# Patient Record
Sex: Male | Born: 1935
Health system: Southern US, Community
[De-identification: ages and names within clinical notes are randomized; demographics above are authoritative.]

## PROBLEM LIST (undated history)

## (undated) DIAGNOSIS — G4733 Obstructive sleep apnea (adult) (pediatric): Secondary | ICD-10-CM

## (undated) DIAGNOSIS — Z9289 Personal history of other medical treatment: Secondary | ICD-10-CM

## (undated) DIAGNOSIS — Z9989 Dependence on other enabling machines and devices: Secondary | ICD-10-CM

## (undated) DIAGNOSIS — I10 Essential (primary) hypertension: Secondary | ICD-10-CM

## (undated) DIAGNOSIS — K5731 Diverticulosis of large intestine without perforation or abscess with bleeding: Secondary | ICD-10-CM

## (undated) DIAGNOSIS — H353230 Exudative age-related macular degeneration, bilateral, stage unspecified: Secondary | ICD-10-CM

## (undated) DIAGNOSIS — I214 Non-ST elevation (NSTEMI) myocardial infarction: Secondary | ICD-10-CM

## (undated) DIAGNOSIS — Z860100 Personal history of colon polyps, unspecified: Secondary | ICD-10-CM

## (undated) DIAGNOSIS — N529 Male erectile dysfunction, unspecified: Secondary | ICD-10-CM

## (undated) DIAGNOSIS — H9190 Unspecified hearing loss, unspecified ear: Secondary | ICD-10-CM

## (undated) DIAGNOSIS — N183 Chronic kidney disease, stage 3 unspecified: Secondary | ICD-10-CM

## (undated) DIAGNOSIS — C4490 Unspecified malignant neoplasm of skin, unspecified: Secondary | ICD-10-CM

## (undated) DIAGNOSIS — I509 Heart failure, unspecified: Secondary | ICD-10-CM

## (undated) DIAGNOSIS — I4892 Unspecified atrial flutter: Secondary | ICD-10-CM

## (undated) DIAGNOSIS — I340 Nonrheumatic mitral (valve) insufficiency: Secondary | ICD-10-CM

## (undated) DIAGNOSIS — E119 Type 2 diabetes mellitus without complications: Secondary | ICD-10-CM

## (undated) DIAGNOSIS — I779 Disorder of arteries and arterioles, unspecified: Secondary | ICD-10-CM

## (undated) DIAGNOSIS — I209 Angina pectoris, unspecified: Secondary | ICD-10-CM

## (undated) DIAGNOSIS — I4891 Unspecified atrial fibrillation: Secondary | ICD-10-CM

## (undated) DIAGNOSIS — M25519 Pain in unspecified shoulder: Secondary | ICD-10-CM

## (undated) DIAGNOSIS — I739 Peripheral vascular disease, unspecified: Secondary | ICD-10-CM

## (undated) DIAGNOSIS — E785 Hyperlipidemia, unspecified: Secondary | ICD-10-CM

## (undated) DIAGNOSIS — I2510 Atherosclerotic heart disease of native coronary artery without angina pectoris: Secondary | ICD-10-CM

## (undated) DIAGNOSIS — I4510 Unspecified right bundle-branch block: Secondary | ICD-10-CM

## (undated) DIAGNOSIS — R9430 Abnormal result of cardiovascular function study, unspecified: Secondary | ICD-10-CM

## (undated) DIAGNOSIS — D649 Anemia, unspecified: Secondary | ICD-10-CM

## (undated) DIAGNOSIS — J189 Pneumonia, unspecified organism: Secondary | ICD-10-CM

## (undated) DIAGNOSIS — M1990 Unspecified osteoarthritis, unspecified site: Secondary | ICD-10-CM

## (undated) DIAGNOSIS — E1142 Type 2 diabetes mellitus with diabetic polyneuropathy: Secondary | ICD-10-CM

## (undated) HISTORY — DX: Pneumonia, unspecified organism: J18.9

## (undated) HISTORY — PX: SKIN CANCER EXCISION: SHX779

## (undated) HISTORY — DX: Male erectile dysfunction, unspecified: N52.9

## (undated) HISTORY — PX: COLONOSCOPY W/ POLYPECTOMY: SHX1380

## (undated) HISTORY — DX: Personal history of colon polyps, unspecified: Z86.0100

## (undated) HISTORY — DX: Essential (primary) hypertension: I10

## (undated) HISTORY — DX: Disorder of arteries and arterioles, unspecified: I77.9

## (undated) HISTORY — DX: Unspecified atrial flutter: I48.92

## (undated) HISTORY — PX: CARDIOVERSION: SHX1299

## (undated) HISTORY — DX: Atherosclerotic heart disease of native coronary artery without angina pectoris: I25.10

## (undated) HISTORY — DX: Diverticulosis of large intestine without perforation or abscess with bleeding: K57.31

## (undated) HISTORY — DX: Hyperlipidemia, unspecified: E78.5

## (undated) HISTORY — PX: CATARACT EXTRACTION W/ INTRAOCULAR LENS  IMPLANT, BILATERAL: SHX1307

## (undated) HISTORY — DX: Pain in unspecified shoulder: M25.519

## (undated) HISTORY — DX: Unspecified atrial fibrillation: I48.91

## (undated) HISTORY — DX: Peripheral vascular disease, unspecified: I73.9

## (undated) HISTORY — PX: TONSILLECTOMY: SUR1361

## (undated) HISTORY — DX: Nonrheumatic mitral (valve) insufficiency: I34.0

## (undated) HISTORY — DX: Type 2 diabetes mellitus with diabetic polyneuropathy: E11.42

## (undated) HISTORY — PX: CORONARY ARTERY BYPASS GRAFT: SHX141

## (undated) HISTORY — DX: Unspecified malignant neoplasm of skin, unspecified: C44.90

## (undated) HISTORY — DX: Personal history of colonic polyps: Z86.010

## (undated) HISTORY — PX: VASECTOMY: SHX75

## (undated) HISTORY — DX: Anemia, unspecified: D64.9

## (undated) HISTORY — PX: CHOLECYSTECTOMY: SHX55

## (undated) HISTORY — DX: Unspecified right bundle-branch block: I45.10

## (undated) HISTORY — DX: Abnormal result of cardiovascular function study, unspecified: R94.30

---

## 1992-11-14 HISTORY — PX: CAROTID ENDARTERECTOMY: SUR193

## 1999-01-26 DIAGNOSIS — D126 Benign neoplasm of colon, unspecified: Secondary | ICD-10-CM | POA: Insufficient documentation

## 2001-10-24 ENCOUNTER — Encounter: Payer: Self-pay | Admitting: Family Medicine

## 2001-10-25 ENCOUNTER — Ambulatory Visit (HOSPITAL_BASED_OUTPATIENT_CLINIC_OR_DEPARTMENT_OTHER): Admission: RE | Admit: 2001-10-25 | Discharge: 2001-10-25 | Payer: Self-pay | Admitting: Pulmonary Disease

## 2002-02-19 ENCOUNTER — Ambulatory Visit (HOSPITAL_BASED_OUTPATIENT_CLINIC_OR_DEPARTMENT_OTHER): Admission: RE | Admit: 2002-02-19 | Discharge: 2002-02-19 | Payer: Self-pay | Admitting: Pulmonary Disease

## 2002-09-14 ENCOUNTER — Encounter: Payer: Self-pay | Admitting: Family Medicine

## 2002-09-14 LAB — CONVERTED CEMR LAB: Hgb A1c MFr Bld: 7.2 %

## 2003-04-15 ENCOUNTER — Encounter: Payer: Self-pay | Admitting: Family Medicine

## 2003-04-15 LAB — CONVERTED CEMR LAB: Hgb A1c MFr Bld: 6 %

## 2003-11-15 ENCOUNTER — Encounter: Payer: Self-pay | Admitting: Family Medicine

## 2003-11-15 LAB — CONVERTED CEMR LAB: Hgb A1c MFr Bld: 5.8 %

## 2004-04-14 ENCOUNTER — Encounter: Payer: Self-pay | Admitting: Family Medicine

## 2004-04-14 LAB — CONVERTED CEMR LAB: Hgb A1c MFr Bld: 6.5 %

## 2004-10-05 ENCOUNTER — Ambulatory Visit: Payer: Self-pay | Admitting: Family Medicine

## 2004-10-20 ENCOUNTER — Ambulatory Visit: Payer: Self-pay | Admitting: Family Medicine

## 2004-11-14 ENCOUNTER — Encounter: Payer: Self-pay | Admitting: Family Medicine

## 2004-11-14 HISTORY — PX: CARDIAC CATHETERIZATION: SHX172

## 2004-11-14 LAB — CONVERTED CEMR LAB
Hgb A1c MFr Bld: 6.6 %
Microalbumin U total vol: 3.5 mg/L

## 2004-11-19 ENCOUNTER — Ambulatory Visit: Payer: Self-pay | Admitting: Internal Medicine

## 2004-11-24 ENCOUNTER — Ambulatory Visit: Payer: Self-pay | Admitting: Family Medicine

## 2004-12-08 ENCOUNTER — Ambulatory Visit: Payer: Self-pay | Admitting: Gastroenterology

## 2004-12-23 ENCOUNTER — Ambulatory Visit: Payer: Self-pay | Admitting: Gastroenterology

## 2004-12-23 ENCOUNTER — Ambulatory Visit (HOSPITAL_COMMUNITY): Admission: RE | Admit: 2004-12-23 | Discharge: 2004-12-23 | Payer: Self-pay | Admitting: Gastroenterology

## 2005-02-04 ENCOUNTER — Ambulatory Visit: Payer: Self-pay | Admitting: Cardiology

## 2005-02-12 ENCOUNTER — Encounter: Payer: Self-pay | Admitting: Family Medicine

## 2005-02-12 LAB — CONVERTED CEMR LAB: Hgb A1c MFr Bld: 6.2 %

## 2005-02-22 ENCOUNTER — Ambulatory Visit: Payer: Self-pay | Admitting: Family Medicine

## 2005-03-01 ENCOUNTER — Ambulatory Visit: Payer: Self-pay | Admitting: Family Medicine

## 2005-05-24 ENCOUNTER — Ambulatory Visit: Payer: Self-pay | Admitting: Family Medicine

## 2005-06-28 ENCOUNTER — Ambulatory Visit: Payer: Self-pay | Admitting: Family Medicine

## 2005-07-11 ENCOUNTER — Ambulatory Visit: Payer: Self-pay | Admitting: Cardiology

## 2005-08-18 ENCOUNTER — Ambulatory Visit: Payer: Self-pay | Admitting: Family Medicine

## 2005-08-30 ENCOUNTER — Ambulatory Visit: Payer: Self-pay | Admitting: Family Medicine

## 2005-08-31 ENCOUNTER — Inpatient Hospital Stay (HOSPITAL_COMMUNITY): Admission: AD | Admit: 2005-08-31 | Discharge: 2005-09-10 | Payer: Self-pay | Admitting: Cardiology

## 2005-08-31 ENCOUNTER — Ambulatory Visit: Payer: Self-pay | Admitting: Cardiology

## 2005-08-31 ENCOUNTER — Ambulatory Visit: Payer: Self-pay

## 2005-09-13 ENCOUNTER — Ambulatory Visit: Payer: Self-pay | Admitting: Family Medicine

## 2005-09-23 ENCOUNTER — Encounter: Admission: RE | Admit: 2005-09-23 | Discharge: 2005-09-23 | Payer: Self-pay | Admitting: Cardiothoracic Surgery

## 2005-09-26 ENCOUNTER — Ambulatory Visit: Payer: Self-pay | Admitting: Cardiology

## 2005-10-04 ENCOUNTER — Ambulatory Visit: Payer: Self-pay | Admitting: Family Medicine

## 2005-11-14 ENCOUNTER — Encounter: Payer: Self-pay | Admitting: Family Medicine

## 2005-11-14 LAB — CONVERTED CEMR LAB: Hgb A1c MFr Bld: 6 %

## 2005-11-24 ENCOUNTER — Ambulatory Visit: Payer: Self-pay | Admitting: Cardiology

## 2005-11-30 ENCOUNTER — Ambulatory Visit: Payer: Self-pay | Admitting: Family Medicine

## 2006-01-23 ENCOUNTER — Ambulatory Visit: Payer: Self-pay | Admitting: Cardiology

## 2006-01-30 ENCOUNTER — Ambulatory Visit: Payer: Self-pay | Admitting: Cardiology

## 2006-01-31 ENCOUNTER — Ambulatory Visit: Payer: Self-pay | Admitting: Family Medicine

## 2006-02-16 ENCOUNTER — Ambulatory Visit: Payer: Self-pay | Admitting: Cardiology

## 2006-03-30 ENCOUNTER — Ambulatory Visit: Payer: Self-pay | Admitting: Cardiology

## 2006-04-13 ENCOUNTER — Ambulatory Visit: Payer: Self-pay | Admitting: Family Medicine

## 2006-04-14 ENCOUNTER — Encounter: Payer: Self-pay | Admitting: Family Medicine

## 2006-04-14 LAB — CONVERTED CEMR LAB
Hgb A1c MFr Bld: 6.1 %
Microalbumin U total vol: 20.4 mg/L

## 2006-04-20 ENCOUNTER — Ambulatory Visit: Payer: Self-pay | Admitting: Family Medicine

## 2006-05-02 ENCOUNTER — Ambulatory Visit: Payer: Self-pay | Admitting: Cardiology

## 2006-06-01 ENCOUNTER — Ambulatory Visit: Payer: Self-pay | Admitting: Family Medicine

## 2006-06-20 ENCOUNTER — Encounter: Admission: RE | Admit: 2006-06-20 | Discharge: 2006-06-20 | Payer: Self-pay | Admitting: Family Medicine

## 2006-06-20 ENCOUNTER — Ambulatory Visit: Payer: Self-pay | Admitting: Family Medicine

## 2006-07-15 ENCOUNTER — Encounter: Payer: Self-pay | Admitting: Family Medicine

## 2006-07-15 LAB — CONVERTED CEMR LAB: Hgb A1c MFr Bld: 5.8 %

## 2006-07-18 ENCOUNTER — Ambulatory Visit: Payer: Self-pay | Admitting: Family Medicine

## 2006-07-20 ENCOUNTER — Ambulatory Visit: Payer: Self-pay | Admitting: Family Medicine

## 2006-07-31 ENCOUNTER — Ambulatory Visit: Payer: Self-pay | Admitting: Cardiology

## 2006-09-28 ENCOUNTER — Ambulatory Visit: Payer: Self-pay | Admitting: Family Medicine

## 2006-10-02 ENCOUNTER — Ambulatory Visit: Payer: Self-pay | Admitting: Cardiology

## 2006-10-04 ENCOUNTER — Ambulatory Visit: Payer: Self-pay

## 2006-10-27 ENCOUNTER — Ambulatory Visit: Payer: Self-pay | Admitting: Family Medicine

## 2006-11-14 ENCOUNTER — Encounter: Payer: Self-pay | Admitting: Family Medicine

## 2006-11-14 HISTORY — PX: LAPAROSCOPIC CHOLECYSTECTOMY: SUR755

## 2006-11-14 LAB — CONVERTED CEMR LAB
Hgb A1c MFr Bld: 6.5 %
PSA: 0.73 ng/mL

## 2006-11-21 ENCOUNTER — Ambulatory Visit: Payer: Self-pay | Admitting: Family Medicine

## 2006-11-23 ENCOUNTER — Ambulatory Visit: Payer: Self-pay | Admitting: Family Medicine

## 2007-01-29 ENCOUNTER — Ambulatory Visit: Payer: Self-pay | Admitting: Family Medicine

## 2007-01-31 ENCOUNTER — Ambulatory Visit: Payer: Self-pay | Admitting: Cardiovascular Disease

## 2007-02-05 ENCOUNTER — Encounter: Payer: Self-pay | Admitting: Family Medicine

## 2007-02-06 DIAGNOSIS — F528 Other sexual dysfunction not due to a substance or known physiological condition: Secondary | ICD-10-CM

## 2007-02-08 ENCOUNTER — Ambulatory Visit: Payer: Self-pay | Admitting: Family Medicine

## 2007-02-14 ENCOUNTER — Ambulatory Visit: Payer: Self-pay | Admitting: Cardiology

## 2007-03-03 DIAGNOSIS — E118 Type 2 diabetes mellitus with unspecified complications: Secondary | ICD-10-CM

## 2007-03-03 DIAGNOSIS — I10 Essential (primary) hypertension: Secondary | ICD-10-CM | POA: Insufficient documentation

## 2007-03-03 DIAGNOSIS — T7840XA Allergy, unspecified, initial encounter: Secondary | ICD-10-CM | POA: Insufficient documentation

## 2007-03-03 DIAGNOSIS — E119 Type 2 diabetes mellitus without complications: Secondary | ICD-10-CM | POA: Insufficient documentation

## 2007-03-03 DIAGNOSIS — E785 Hyperlipidemia, unspecified: Secondary | ICD-10-CM

## 2007-03-03 DIAGNOSIS — I119 Hypertensive heart disease without heart failure: Secondary | ICD-10-CM

## 2007-03-03 DIAGNOSIS — Z794 Long term (current) use of insulin: Secondary | ICD-10-CM

## 2007-03-13 ENCOUNTER — Telehealth (INDEPENDENT_AMBULATORY_CARE_PROVIDER_SITE_OTHER): Payer: Self-pay | Admitting: *Deleted

## 2007-03-29 ENCOUNTER — Ambulatory Visit (HOSPITAL_COMMUNITY): Admission: RE | Admit: 2007-03-29 | Discharge: 2007-03-29 | Payer: Self-pay | Admitting: General Surgery

## 2007-03-29 ENCOUNTER — Encounter (INDEPENDENT_AMBULATORY_CARE_PROVIDER_SITE_OTHER): Payer: Self-pay | Admitting: Specialist

## 2007-04-26 ENCOUNTER — Encounter: Payer: Self-pay | Admitting: Family Medicine

## 2007-05-17 ENCOUNTER — Ambulatory Visit: Payer: Self-pay | Admitting: Cardiology

## 2007-09-04 ENCOUNTER — Ambulatory Visit: Payer: Self-pay | Admitting: Cardiology

## 2007-09-06 ENCOUNTER — Ambulatory Visit: Payer: Self-pay | Admitting: Family Medicine

## 2007-09-06 ENCOUNTER — Ambulatory Visit: Payer: Self-pay

## 2007-09-06 LAB — CONVERTED CEMR LAB: Hgb A1c MFr Bld: 6.3 % — ABNORMAL HIGH (ref 4.6–6.0)

## 2007-09-12 ENCOUNTER — Ambulatory Visit: Payer: Self-pay | Admitting: Family Medicine

## 2007-09-13 ENCOUNTER — Ambulatory Visit: Payer: Self-pay | Admitting: Family Medicine

## 2007-09-13 LAB — CONVERTED CEMR LAB
Basophils Absolute: 0 10*3/uL (ref 0.0–0.1)
Basophils Relative: 0.6 % (ref 0.0–1.0)
Bilirubin Urine: NEGATIVE
Blood in Urine, dipstick: NEGATIVE
Eosinophils Absolute: 0.8 10*3/uL — ABNORMAL HIGH (ref 0.0–0.6)
Eosinophils Relative: 13.6 % — ABNORMAL HIGH (ref 0.0–5.0)
Glucose, Urine, Semiquant: NEGATIVE
HCT: 33.9 % — ABNORMAL LOW (ref 39.0–52.0)
Hemoglobin: 11.5 g/dL — ABNORMAL LOW (ref 13.0–17.0)
Hgb A1c MFr Bld: 6.3 % — ABNORMAL HIGH (ref 4.6–6.0)
Ketones, urine, test strip: NEGATIVE
Lymphocytes Relative: 24.1 % (ref 12.0–46.0)
MCHC: 33.9 g/dL (ref 30.0–36.0)
MCV: 92.5 fL (ref 78.0–100.0)
Monocytes Absolute: 0.5 10*3/uL (ref 0.2–0.7)
Monocytes Relative: 9.7 % (ref 3.0–11.0)
Neutro Abs: 3 10*3/uL (ref 1.4–7.7)
Neutrophils Relative %: 52 % (ref 43.0–77.0)
Nitrite: NEGATIVE
Platelets: 133 10*3/uL — ABNORMAL LOW (ref 150–400)
Protein, U semiquant: NEGATIVE
RBC: 3.67 M/uL — ABNORMAL LOW (ref 4.22–5.81)
RDW: 13.9 % (ref 11.5–14.6)
Specific Gravity, Urine: 1.01
TSH: 2.97 microintl units/mL (ref 0.35–5.50)
Urobilinogen, UA: 1
WBC Urine, dipstick: NEGATIVE
WBC: 5.6 10*3/uL (ref 4.5–10.5)
pH: 6

## 2007-09-17 LAB — CONVERTED CEMR LAB
Basophils Absolute: 0 10*3/uL (ref 0.0–0.1)
Basophils Relative: 0.6 % (ref 0.0–1.0)
Eosinophils Absolute: 0.8 10*3/uL — ABNORMAL HIGH (ref 0.0–0.6)
Eosinophils Relative: 13.6 % — ABNORMAL HIGH (ref 0.0–5.0)
Folate: >20 ng/mL
HCT: 33.9 % — ABNORMAL LOW (ref 39.0–52.0)
Hemoglobin: 11.5 g/dL — ABNORMAL LOW (ref 13.0–17.0)
Hgb A1c MFr Bld: 6.3 % — ABNORMAL HIGH (ref 4.6–6.0)
Iron: 58 ug/dL (ref 42–165)
Lymphocytes Relative: 24.1 % (ref 12.0–46.0)
MCHC: 33.9 g/dL (ref 30.0–36.0)
MCV: 92.5 fL (ref 78.0–100.0)
Monocytes Absolute: 0.5 10*3/uL (ref 0.2–0.7)
Monocytes Relative: 9.7 % (ref 3.0–11.0)
Neutro Abs: 3 10*3/uL (ref 1.4–7.7)
Neutrophils Relative %: 52 % (ref 43.0–77.0)
Platelets: 133 10*3/uL — ABNORMAL LOW (ref 150–400)
RBC: 3.67 M/uL — ABNORMAL LOW (ref 4.22–5.81)
RDW: 13.9 % (ref 11.5–14.6)
TSH: 2.97 microintl units/mL (ref 0.35–5.50)
Transferrin: 309.6 mg/dL (ref >=212.0)
Vitamin B-12: 263 pg/mL (ref 211–911)
WBC: 5.6 10*3/uL (ref 4.5–10.5)

## 2007-10-02 ENCOUNTER — Ambulatory Visit: Payer: Self-pay | Admitting: Family Medicine

## 2007-10-02 ENCOUNTER — Encounter (INDEPENDENT_AMBULATORY_CARE_PROVIDER_SITE_OTHER): Payer: Self-pay | Admitting: *Deleted

## 2007-10-02 LAB — FECAL OCCULT BLOOD, GUAIAC: Fecal Occult Blood: NEGATIVE

## 2007-10-04 ENCOUNTER — Ambulatory Visit: Payer: Self-pay | Admitting: Family Medicine

## 2007-10-24 ENCOUNTER — Ambulatory Visit: Payer: Self-pay | Admitting: Family Medicine

## 2007-10-24 LAB — CONVERTED CEMR LAB
Basophils Absolute: 0 10*3/uL (ref 0.0–0.1)
Basophils Relative: 0.7 % (ref 0.0–1.0)
Eosinophils Absolute: 0.7 10*3/uL — ABNORMAL HIGH (ref 0.0–0.6)
Eosinophils Relative: 11.1 % — ABNORMAL HIGH (ref 0.0–5.0)
Glucose, Bld: 127 mg/dL — ABNORMAL HIGH (ref 70–99)
HCT: 34.8 % — ABNORMAL LOW (ref 39.0–52.0)
Hemoglobin: 11.7 g/dL — ABNORMAL LOW (ref 13.0–17.0)
Hgb A1c MFr Bld: 6.3 % — ABNORMAL HIGH (ref 4.6–6.0)
Iron: 56 ug/dL (ref 42–165)
Lymphocytes Relative: 25.6 % (ref 12.0–46.0)
MCHC: 33.5 g/dL (ref 30.0–36.0)
MCV: 93.2 fL (ref 78.0–100.0)
Monocytes Absolute: 0.5 10*3/uL (ref 0.2–0.7)
Monocytes Relative: 8.5 % (ref 3.0–11.0)
Neutro Abs: 3.2 10*3/uL (ref 1.4–7.7)
Neutrophils Relative %: 54.1 % (ref 43.0–77.0)
Platelets: 145 10*3/uL — ABNORMAL LOW (ref 150–400)
RBC: 3.73 M/uL — ABNORMAL LOW (ref 4.22–5.81)
RDW: 14 % (ref 11.5–14.6)
WBC: 5.9 10*3/uL (ref 4.5–10.5)

## 2007-11-12 ENCOUNTER — Encounter: Payer: Self-pay | Admitting: Family Medicine

## 2007-11-15 DIAGNOSIS — K5731 Diverticulosis of large intestine without perforation or abscess with bleeding: Secondary | ICD-10-CM

## 2007-11-15 HISTORY — DX: Diverticulosis of large intestine without perforation or abscess with bleeding: K57.31

## 2008-01-03 ENCOUNTER — Ambulatory Visit: Payer: Self-pay | Admitting: Family Medicine

## 2008-01-03 LAB — CONVERTED CEMR LAB
ALT: 22 units/L (ref 0–53)
AST: 24 units/L (ref 0–37)
Albumin: 3.7 g/dL (ref 3.5–5.2)
Alkaline Phosphatase: 57 units/L (ref 39–117)
BUN: 15 mg/dL (ref 6–23)
Bilirubin, Direct: 0.1 mg/dL (ref 0.0–0.3)
CO2: 28 meq/L (ref 19–32)
Calcium: 9.7 mg/dL (ref 8.4–10.5)
Chloride: 102 meq/L (ref 96–112)
Cholesterol: 137 mg/dL (ref 0–200)
Creatinine, Ser: 1.4 mg/dL (ref 0.4–1.5)
Creatinine,U: 48.8 mg/dL
GFR calc Af Amer: 64 mL/min
GFR calc non Af Amer: 53 mL/min
Glucose, Bld: 141 mg/dL — ABNORMAL HIGH (ref 70–99)
HCT: 36.2 % — ABNORMAL LOW (ref 39.0–52.0)
HDL: 37.6 mg/dL — ABNORMAL LOW (ref >39.0)
Hemoglobin: 11.6 g/dL — ABNORMAL LOW (ref 13.0–17.0)
Hgb A1c MFr Bld: 6.5 % — ABNORMAL HIGH (ref 4.6–6.0)
LDL Cholesterol: 82 mg/dL (ref 0–99)
MCHC: 31.9 g/dL (ref 30.0–36.0)
MCV: 91.8 fL (ref 78.0–100.0)
Microalb Creat Ratio: 34.8 mg/g — ABNORMAL HIGH (ref 0.0–30.0)
Microalb, Ur: 1.7 mg/dL (ref 0.0–1.9)
PSA: 0.92 ng/mL (ref 0.10–4.00)
Platelets: 147 10*3/uL — ABNORMAL LOW (ref 150–400)
Potassium: 4.1 meq/L (ref 3.5–5.1)
RBC: 3.94 M/uL — ABNORMAL LOW (ref 4.22–5.81)
RDW: 14.1 % (ref 11.5–14.6)
Sodium: 138 meq/L (ref 135–145)
TSH: 3.72 microintl units/mL (ref 0.35–5.50)
Total Bilirubin: 0.7 mg/dL (ref 0.3–1.2)
Total CHOL/HDL Ratio: 3.6
Total Protein: 6.8 g/dL (ref 6.0–8.3)
Triglycerides: 85 mg/dL (ref 0–149)
VLDL: 17 mg/dL (ref 0–40)
WBC: 6.1 10*3/uL (ref 4.5–10.5)

## 2008-01-07 ENCOUNTER — Ambulatory Visit: Payer: Self-pay | Admitting: Family Medicine

## 2008-01-08 ENCOUNTER — Encounter: Payer: Self-pay | Admitting: Family Medicine

## 2008-03-11 ENCOUNTER — Ambulatory Visit: Payer: Self-pay | Admitting: Cardiology

## 2008-03-21 ENCOUNTER — Encounter: Payer: Self-pay | Admitting: Cardiology

## 2008-03-21 ENCOUNTER — Ambulatory Visit: Payer: Self-pay

## 2008-04-02 ENCOUNTER — Ambulatory Visit: Payer: Self-pay | Admitting: Cardiology

## 2008-04-14 ENCOUNTER — Encounter: Payer: Self-pay | Admitting: Family Medicine

## 2008-07-10 ENCOUNTER — Ambulatory Visit: Payer: Self-pay | Admitting: Family Medicine

## 2008-07-14 LAB — CONVERTED CEMR LAB: Hgb A1c MFr Bld: 6.2 % — ABNORMAL HIGH (ref 4.6–6.0)

## 2008-07-15 ENCOUNTER — Ambulatory Visit: Payer: Self-pay | Admitting: Family Medicine

## 2008-08-14 HISTORY — PX: DOPPLER ECHOCARDIOGRAPHY: SHX263

## 2008-09-01 ENCOUNTER — Ambulatory Visit: Payer: Self-pay | Admitting: Internal Medicine

## 2008-09-01 ENCOUNTER — Telehealth: Payer: Self-pay | Admitting: Family Medicine

## 2008-09-02 ENCOUNTER — Ambulatory Visit: Payer: Self-pay | Admitting: Internal Medicine

## 2008-09-02 ENCOUNTER — Inpatient Hospital Stay (HOSPITAL_COMMUNITY): Admission: EM | Admit: 2008-09-02 | Discharge: 2008-09-07 | Payer: Self-pay | Admitting: Emergency Medicine

## 2008-09-02 ENCOUNTER — Ambulatory Visit: Payer: Self-pay | Admitting: Cardiology

## 2008-09-03 ENCOUNTER — Encounter: Payer: Self-pay | Admitting: Internal Medicine

## 2008-09-03 LAB — HM COLONOSCOPY

## 2008-09-05 ENCOUNTER — Encounter: Payer: Self-pay | Admitting: Cardiology

## 2008-09-07 ENCOUNTER — Encounter: Payer: Self-pay | Admitting: Family Medicine

## 2008-09-08 ENCOUNTER — Telehealth: Payer: Self-pay | Admitting: Family Medicine

## 2008-09-11 ENCOUNTER — Ambulatory Visit: Payer: Self-pay | Admitting: Family Medicine

## 2008-09-11 LAB — CONVERTED CEMR LAB
BUN: 23 mg/dL (ref 6–23)
Basophils Absolute: 0 10*3/uL (ref 0.0–0.1)
Basophils Relative: 0.3 % (ref 0.0–3.0)
CO2: 31 meq/L (ref 19–32)
Calcium: 8.9 mg/dL (ref 8.4–10.5)
Chloride: 107 meq/L (ref 96–112)
Creatinine, Ser: 1.4 mg/dL (ref 0.4–1.5)
Eosinophils Absolute: 0.9 10*3/uL — ABNORMAL HIGH (ref 0.0–0.7)
Eosinophils Relative: 14 % — ABNORMAL HIGH (ref 0.0–5.0)
GFR calc Af Amer: 64 mL/min
GFR calc non Af Amer: 53 mL/min
Glucose, Bld: 133 mg/dL — ABNORMAL HIGH (ref 70–99)
HCT: 34.3 % — ABNORMAL LOW (ref 39.0–52.0)
Hemoglobin: 11.7 g/dL — ABNORMAL LOW (ref 13.0–17.0)
Lymphocytes Relative: 21.4 % (ref 12.0–46.0)
MCHC: 34.1 g/dL (ref 30.0–36.0)
MCV: 91.2 fL (ref 78.0–100.0)
Monocytes Absolute: 0.6 10*3/uL (ref 0.1–1.0)
Monocytes Relative: 9.4 % (ref 3.0–12.0)
Neutro Abs: 3.5 10*3/uL (ref 1.4–7.7)
Neutrophils Relative %: 54.9 % (ref 43.0–77.0)
Platelets: 151 10*3/uL (ref 150–400)
Potassium: 4 meq/L (ref 3.5–5.1)
RBC: 3.76 M/uL — ABNORMAL LOW (ref 4.22–5.81)
RDW: 14.9 % — ABNORMAL HIGH (ref 11.5–14.6)
Sodium: 142 meq/L (ref 135–145)
WBC: 6.3 10*3/uL (ref 4.5–10.5)

## 2008-09-12 ENCOUNTER — Inpatient Hospital Stay (HOSPITAL_COMMUNITY): Admission: EM | Admit: 2008-09-12 | Discharge: 2008-09-17 | Payer: Self-pay | Admitting: Emergency Medicine

## 2008-09-12 ENCOUNTER — Telehealth: Payer: Self-pay | Admitting: Internal Medicine

## 2008-09-17 ENCOUNTER — Encounter: Payer: Self-pay | Admitting: Family Medicine

## 2008-09-25 ENCOUNTER — Ambulatory Visit: Payer: Self-pay | Admitting: Cardiology

## 2008-10-01 ENCOUNTER — Ambulatory Visit: Payer: Self-pay | Admitting: Internal Medicine

## 2008-10-01 DIAGNOSIS — D62 Acute posthemorrhagic anemia: Secondary | ICD-10-CM | POA: Insufficient documentation

## 2008-10-01 DIAGNOSIS — Z860100 Personal history of colon polyps, unspecified: Secondary | ICD-10-CM | POA: Insufficient documentation

## 2008-10-01 DIAGNOSIS — K5731 Diverticulosis of large intestine without perforation or abscess with bleeding: Secondary | ICD-10-CM

## 2008-10-02 LAB — CONVERTED CEMR LAB
Basophils Absolute: 0.1 10*3/uL (ref 0.0–0.1)
Basophils Relative: 0.9 % (ref 0.0–3.0)
Eosinophils Absolute: 0.6 10*3/uL (ref 0.0–0.7)
Eosinophils Relative: 10.9 % — ABNORMAL HIGH (ref 0.0–5.0)
Ferritin: 28.4 ng/mL (ref 22.0–322.0)
HCT: 36.4 % — ABNORMAL LOW (ref 39.0–52.0)
Hemoglobin: 12.2 g/dL — ABNORMAL LOW (ref 13.0–17.0)
Lymphocytes Relative: 23.5 % (ref 12.0–46.0)
MCHC: 33.5 g/dL (ref 30.0–36.0)
MCV: 93.4 fL (ref 78.0–100.0)
Monocytes Absolute: 0.5 10*3/uL (ref 0.1–1.0)
Monocytes Relative: 8.4 % (ref 3.0–12.0)
Neutro Abs: 3.3 10*3/uL (ref 1.4–7.7)
Neutrophils Relative %: 56.3 % (ref 43.0–77.0)
Platelets: 139 10*3/uL — ABNORMAL LOW (ref 150–400)
RBC: 3.9 M/uL — ABNORMAL LOW (ref 4.22–5.81)
RDW: 14.6 % (ref 11.5–14.6)
WBC: 5.9 10*3/uL (ref 4.5–10.5)

## 2008-11-03 ENCOUNTER — Telehealth: Payer: Self-pay | Admitting: Family Medicine

## 2008-11-28 ENCOUNTER — Telehealth: Payer: Self-pay | Admitting: Family Medicine

## 2009-01-07 ENCOUNTER — Ambulatory Visit: Payer: Self-pay | Admitting: Family Medicine

## 2009-01-07 LAB — CONVERTED CEMR LAB
ALT: 21 units/L (ref 0–53)
AST: 21 units/L (ref 0–37)
Albumin: 3.8 g/dL (ref 3.5–5.2)
Alkaline Phosphatase: 61 units/L (ref 39–117)
BUN: 22 mg/dL (ref 6–23)
Basophils Absolute: 0 10*3/uL (ref 0.0–0.1)
Basophils Relative: 0.3 % (ref 0.0–3.0)
Bilirubin, Direct: 0.1 mg/dL (ref 0.0–0.3)
CO2: 31 meq/L (ref 19–32)
Calcium: 9.4 mg/dL (ref 8.4–10.5)
Chloride: 104 meq/L (ref 96–112)
Cholesterol: 142 mg/dL (ref 0–200)
Creatinine, Ser: 1.3 mg/dL (ref 0.4–1.5)
Creatinine,U: 32.3 mg/dL
Eosinophils Absolute: 0.7 10*3/uL (ref 0.0–0.7)
Eosinophils Relative: 10.1 % — ABNORMAL HIGH (ref 0.0–5.0)
Ferritin: 35.5 ng/mL (ref 22.0–322.0)
GFR calc Af Amer: 69 mL/min
GFR calc non Af Amer: 57 mL/min
Glucose, Bld: 144 mg/dL — ABNORMAL HIGH (ref 70–99)
HCT: 33.9 % — ABNORMAL LOW (ref 39.0–52.0)
HDL: 40.6 mg/dL (ref >39.0)
Hemoglobin: 11.4 g/dL — ABNORMAL LOW (ref 13.0–17.0)
Hgb A1c MFr Bld: 6.5 % — ABNORMAL HIGH (ref 4.6–6.0)
Iron: 39 ug/dL — ABNORMAL LOW (ref 42–165)
LDL Cholesterol: 88 mg/dL (ref 0–99)
Lymphocytes Relative: 21.5 % (ref 12.0–46.0)
MCHC: 33.7 g/dL (ref 30.0–36.0)
MCV: 91.5 fL (ref 78.0–100.0)
Microalb Creat Ratio: 46.4 mg/g — ABNORMAL HIGH (ref 0.0–30.0)
Microalb, Ur: 1.5 mg/dL (ref 0.0–1.9)
Monocytes Absolute: 0.7 10*3/uL (ref 0.1–1.0)
Monocytes Relative: 9.5 % (ref 3.0–12.0)
Neutro Abs: 4 10*3/uL (ref 1.4–7.7)
Neutrophils Relative %: 58.6 % (ref 43.0–77.0)
PSA: 0.91 ng/mL (ref 0.10–4.00)
Platelets: 131 10*3/uL — ABNORMAL LOW (ref 150–400)
Potassium: 4.1 meq/L (ref 3.5–5.1)
RBC: 3.7 M/uL — ABNORMAL LOW (ref 4.22–5.81)
RDW: 14.2 % (ref 11.5–14.6)
Sodium: 142 meq/L (ref 135–145)
TSH: 3.74 microintl units/mL (ref 0.35–5.50)
Total Bilirubin: 0.9 mg/dL (ref 0.3–1.2)
Total CHOL/HDL Ratio: 3.5
Total Protein: 7 g/dL (ref 6.0–8.3)
Triglycerides: 66 mg/dL (ref 0–149)
VLDL: 13 mg/dL (ref 0–40)
WBC: 6.9 10*3/uL (ref 4.5–10.5)

## 2009-01-20 ENCOUNTER — Ambulatory Visit: Payer: Self-pay | Admitting: Family Medicine

## 2009-01-26 ENCOUNTER — Telehealth: Payer: Self-pay | Admitting: Family Medicine

## 2009-02-11 ENCOUNTER — Encounter: Payer: Self-pay | Admitting: Family Medicine

## 2009-03-03 ENCOUNTER — Telehealth: Payer: Self-pay | Admitting: Family Medicine

## 2009-03-10 ENCOUNTER — Telehealth: Payer: Self-pay | Admitting: Family Medicine

## 2009-03-11 ENCOUNTER — Telehealth: Payer: Self-pay | Admitting: Family Medicine

## 2009-03-16 ENCOUNTER — Ambulatory Visit: Payer: Self-pay | Admitting: Family Medicine

## 2009-05-12 DIAGNOSIS — Z9189 Other specified personal risk factors, not elsewhere classified: Secondary | ICD-10-CM | POA: Insufficient documentation

## 2009-05-12 DIAGNOSIS — Z9849 Cataract extraction status, unspecified eye: Secondary | ICD-10-CM

## 2009-05-12 DIAGNOSIS — R609 Edema, unspecified: Secondary | ICD-10-CM

## 2009-05-12 DIAGNOSIS — E1142 Type 2 diabetes mellitus with diabetic polyneuropathy: Secondary | ICD-10-CM

## 2009-05-12 DIAGNOSIS — Z8669 Personal history of other diseases of the nervous system and sense organs: Secondary | ICD-10-CM | POA: Insufficient documentation

## 2009-05-12 DIAGNOSIS — G4730 Sleep apnea, unspecified: Secondary | ICD-10-CM

## 2009-05-12 DIAGNOSIS — Z9889 Other specified postprocedural states: Secondary | ICD-10-CM

## 2009-05-12 DIAGNOSIS — K219 Gastro-esophageal reflux disease without esophagitis: Secondary | ICD-10-CM | POA: Insufficient documentation

## 2009-05-14 ENCOUNTER — Ambulatory Visit: Payer: Self-pay | Admitting: Cardiology

## 2009-05-25 ENCOUNTER — Ambulatory Visit: Payer: Self-pay | Admitting: Family Medicine

## 2009-07-27 ENCOUNTER — Telehealth (INDEPENDENT_AMBULATORY_CARE_PROVIDER_SITE_OTHER): Payer: Self-pay | Admitting: Internal Medicine

## 2009-07-28 ENCOUNTER — Telehealth (INDEPENDENT_AMBULATORY_CARE_PROVIDER_SITE_OTHER): Payer: Self-pay | Admitting: *Deleted

## 2009-09-18 ENCOUNTER — Ambulatory Visit: Payer: Self-pay | Admitting: Cardiology

## 2009-09-22 ENCOUNTER — Encounter: Payer: Self-pay | Admitting: Cardiology

## 2009-09-23 ENCOUNTER — Ambulatory Visit: Payer: Self-pay | Admitting: Cardiology

## 2009-09-28 ENCOUNTER — Telehealth (INDEPENDENT_AMBULATORY_CARE_PROVIDER_SITE_OTHER): Payer: Self-pay | Admitting: *Deleted

## 2009-09-29 ENCOUNTER — Ambulatory Visit: Payer: Self-pay | Admitting: Cardiology

## 2009-09-29 ENCOUNTER — Encounter (HOSPITAL_COMMUNITY): Admission: RE | Admit: 2009-09-29 | Discharge: 2009-11-12 | Payer: Self-pay | Admitting: Cardiology

## 2009-09-29 ENCOUNTER — Ambulatory Visit: Payer: Self-pay

## 2009-10-05 ENCOUNTER — Ambulatory Visit: Payer: Self-pay | Admitting: Cardiology

## 2009-10-28 ENCOUNTER — Ambulatory Visit: Payer: Self-pay | Admitting: Family Medicine

## 2009-10-28 DIAGNOSIS — R252 Cramp and spasm: Secondary | ICD-10-CM

## 2009-10-29 ENCOUNTER — Encounter (INDEPENDENT_AMBULATORY_CARE_PROVIDER_SITE_OTHER): Payer: Self-pay | Admitting: Internal Medicine

## 2009-11-02 LAB — CONVERTED CEMR LAB
BUN: 22 mg/dL (ref 6–23)
Basophils Absolute: 0 10*3/uL (ref 0.0–0.1)
Basophils Relative: 0 % (ref 0–1)
CO2: 27 meq/L (ref 19–32)
Calcium: 9.6 mg/dL (ref 8.4–10.5)
Chloride: 103 meq/L (ref 96–112)
Creatinine, Ser: 1.41 mg/dL (ref 0.40–1.50)
Eosinophils Absolute: 0.9 10*3/uL — ABNORMAL HIGH (ref 0.0–0.7)
Eosinophils Relative: 13 % — ABNORMAL HIGH (ref 0–5)
Glucose, Bld: 141 mg/dL — ABNORMAL HIGH (ref 70–99)
HCT: 37 % — ABNORMAL LOW (ref 39.0–52.0)
Hemoglobin: 11.7 g/dL — ABNORMAL LOW (ref 13.0–17.0)
Hgb A1c MFr Bld: 6 % (ref 4.6–6.1)
Lymphocytes Relative: 21 % (ref 12–46)
Lymphs Abs: 1.5 10*3/uL (ref 0.7–4.0)
MCHC: 31.6 g/dL (ref 30.0–36.0)
MCV: 95.1 fL (ref 78.0–100.0)
Monocytes Absolute: 0.6 10*3/uL (ref 0.1–1.0)
Monocytes Relative: 9 % (ref 3–12)
Neutro Abs: 4 10*3/uL (ref 1.7–7.7)
Neutrophils Relative %: 56 % (ref 43–77)
Platelets: 157 10*3/uL (ref 150–400)
Potassium: 4.4 meq/L (ref 3.5–5.3)
RBC: 3.89 M/uL — ABNORMAL LOW (ref 4.22–5.81)
RDW: 14.7 % (ref 11.5–15.5)
Sodium: 141 meq/L (ref 135–145)
WBC: 7.1 10*3/uL (ref 4.0–10.5)

## 2009-11-03 ENCOUNTER — Ambulatory Visit: Payer: Self-pay | Admitting: Family Medicine

## 2009-12-16 ENCOUNTER — Ambulatory Visit: Payer: Self-pay | Admitting: Cardiology

## 2010-01-04 ENCOUNTER — Ambulatory Visit: Payer: Self-pay | Admitting: Family Medicine

## 2010-01-04 LAB — CONVERTED CEMR LAB
ALT: 21 units/L (ref 0–53)
AST: 22 units/L (ref 0–37)
Albumin: 3.8 g/dL (ref 3.5–5.2)
Alkaline Phosphatase: 61 units/L (ref 39–117)
BUN: 23 mg/dL (ref 6–23)
Basophils Absolute: 0 10*3/uL (ref 0.0–0.1)
Basophils Relative: 0.1 % (ref 0.0–3.0)
Bilirubin, Direct: 0.1 mg/dL (ref 0.0–0.3)
CO2: 31 meq/L (ref 19–32)
Calcium: 9.4 mg/dL (ref 8.4–10.5)
Chloride: 111 meq/L (ref 96–112)
Cholesterol: 148 mg/dL (ref 0–200)
Creatinine, Ser: 1.5 mg/dL (ref 0.4–1.5)
Creatinine,U: 103.7 mg/dL
Eosinophils Absolute: 0.6 10*3/uL (ref 0.0–0.7)
Eosinophils Relative: 11.3 % — ABNORMAL HIGH (ref 0.0–5.0)
GFR calc non Af Amer: 48.38 mL/min (ref >60)
Glucose, Bld: 143 mg/dL — ABNORMAL HIGH (ref 70–99)
HCT: 34.6 % — ABNORMAL LOW (ref 39.0–52.0)
HDL: 45.7 mg/dL (ref >39.00)
Hemoglobin: 11.8 g/dL — ABNORMAL LOW (ref 13.0–17.0)
Hgb A1c MFr Bld: 6.1 % (ref 4.6–6.5)
LDL Cholesterol: 86 mg/dL (ref 0–99)
Lymphocytes Relative: 25.2 % (ref 12.0–46.0)
Lymphs Abs: 1.4 10*3/uL (ref 0.7–4.0)
MCHC: 34 g/dL (ref 30.0–36.0)
MCV: 94 fL (ref 78.0–100.0)
Microalb Creat Ratio: 24.1 mg/g (ref 0.0–30.0)
Microalb, Ur: 2.5 mg/dL — ABNORMAL HIGH (ref 0.0–1.9)
Monocytes Absolute: 0.5 10*3/uL (ref 0.1–1.0)
Monocytes Relative: 8.3 % (ref 3.0–12.0)
Neutro Abs: 3 10*3/uL (ref 1.4–7.7)
Neutrophils Relative %: 55.1 % (ref 43.0–77.0)
PSA: 1.22 ng/mL (ref 0.10–4.00)
Platelets: 132 10*3/uL — ABNORMAL LOW (ref 150.0–400.0)
Potassium: 4.2 meq/L (ref 3.5–5.1)
RBC: 3.68 M/uL — ABNORMAL LOW (ref 4.22–5.81)
RDW: 14.3 % (ref 11.5–14.6)
Sodium: 145 meq/L (ref 135–145)
Total Bilirubin: 0.5 mg/dL (ref 0.3–1.2)
Total CHOL/HDL Ratio: 3
Total Protein: 7.1 g/dL (ref 6.0–8.3)
Triglycerides: 81 mg/dL (ref 0.0–149.0)
VLDL: 16.2 mg/dL (ref 0.0–40.0)
WBC: 5.5 10*3/uL (ref 4.5–10.5)

## 2010-01-25 ENCOUNTER — Ambulatory Visit: Payer: Self-pay | Admitting: Family Medicine

## 2010-02-12 ENCOUNTER — Encounter: Payer: Self-pay | Admitting: Family Medicine

## 2010-06-22 ENCOUNTER — Encounter (INDEPENDENT_AMBULATORY_CARE_PROVIDER_SITE_OTHER): Payer: Self-pay | Admitting: *Deleted

## 2010-07-15 DIAGNOSIS — I4892 Unspecified atrial flutter: Secondary | ICD-10-CM

## 2010-07-15 DIAGNOSIS — I214 Non-ST elevation (NSTEMI) myocardial infarction: Secondary | ICD-10-CM

## 2010-07-15 DIAGNOSIS — J189 Pneumonia, unspecified organism: Secondary | ICD-10-CM

## 2010-07-15 HISTORY — DX: Non-ST elevation (NSTEMI) myocardial infarction: I21.4

## 2010-07-15 HISTORY — PX: DOPPLER ECHOCARDIOGRAPHY: SHX263

## 2010-07-15 HISTORY — DX: Pneumonia, unspecified organism: J18.9

## 2010-07-15 HISTORY — DX: Unspecified atrial flutter: I48.92

## 2010-08-01 ENCOUNTER — Ambulatory Visit: Payer: Self-pay | Admitting: Cardiology

## 2010-08-01 ENCOUNTER — Ambulatory Visit: Payer: Self-pay | Admitting: Gastroenterology

## 2010-08-01 ENCOUNTER — Inpatient Hospital Stay (HOSPITAL_COMMUNITY): Admission: EM | Admit: 2010-08-01 | Discharge: 2010-08-05 | Payer: Self-pay | Admitting: Emergency Medicine

## 2010-08-01 ENCOUNTER — Encounter: Payer: Self-pay | Admitting: Cardiology

## 2010-08-02 HISTORY — PX: DOPPLER ECHOCARDIOGRAPHY: SHX263

## 2010-08-04 HISTORY — PX: CARDIAC CATHETERIZATION: SHX172

## 2010-08-09 ENCOUNTER — Ambulatory Visit: Payer: Self-pay | Admitting: Family Medicine

## 2010-08-09 LAB — CONVERTED CEMR LAB
INR: 1.4
Prothrombin Time: 16.6 s

## 2010-08-13 ENCOUNTER — Ambulatory Visit: Payer: Self-pay | Admitting: Family Medicine

## 2010-08-13 LAB — CONVERTED CEMR LAB
INR: 2.2
Prothrombin Time: 26.1 s

## 2010-08-17 ENCOUNTER — Telehealth: Payer: Self-pay | Admitting: Family Medicine

## 2010-08-20 ENCOUNTER — Ambulatory Visit: Payer: Self-pay | Admitting: Family Medicine

## 2010-08-20 LAB — CONVERTED CEMR LAB
INR: 2.7
Prothrombin Time: 32.1 s

## 2010-08-24 ENCOUNTER — Encounter: Payer: Self-pay | Admitting: Cardiology

## 2010-08-24 DIAGNOSIS — I4892 Unspecified atrial flutter: Secondary | ICD-10-CM

## 2010-09-01 ENCOUNTER — Encounter: Payer: Self-pay | Admitting: Cardiology

## 2010-09-02 ENCOUNTER — Encounter: Payer: Self-pay | Admitting: Cardiology

## 2010-09-02 ENCOUNTER — Ambulatory Visit: Payer: Self-pay | Admitting: Cardiology

## 2010-09-02 ENCOUNTER — Ambulatory Visit: Payer: Self-pay | Admitting: Cardiovascular Disease

## 2010-09-02 LAB — CONVERTED CEMR LAB: POC INR: 2.4

## 2010-09-06 ENCOUNTER — Ambulatory Visit: Payer: Self-pay | Admitting: Internal Medicine

## 2010-09-07 ENCOUNTER — Ambulatory Visit: Payer: Self-pay | Admitting: Internal Medicine

## 2010-09-07 LAB — CONVERTED CEMR LAB
Basophils Absolute: 0 10*3/uL (ref 0.0–0.1)
Calcium: 9.6 mg/dL (ref 8.4–10.5)
Creatinine, Ser: 1.5 mg/dL (ref 0.4–1.5)
Eosinophils Absolute: 0.2 10*3/uL (ref 0.0–0.7)
GFR calc non Af Amer: 47.93 mL/min (ref >60)
Glucose, Bld: 154 mg/dL — ABNORMAL HIGH (ref 70–99)
Hemoglobin: 11.5 g/dL — ABNORMAL LOW (ref 13.0–17.0)
INR: 2.4 — ABNORMAL HIGH (ref 0.8–1.0)
Lymphocytes Relative: 22 % (ref 12.0–46.0)
Monocytes Relative: 8.9 % (ref 3.0–12.0)
Neutro Abs: 3.5 10*3/uL (ref 1.4–7.7)
Neutrophils Relative %: 64.3 % (ref 43.0–77.0)
Prothrombin Time: 25.4 s — ABNORMAL HIGH (ref 9.7–11.8)
RDW: 14.5 % (ref 11.5–14.6)
Sodium: 142 meq/L (ref 135–145)
aPTT: 40.1 s — ABNORMAL HIGH (ref 21.7–28.8)

## 2010-09-08 ENCOUNTER — Ambulatory Visit: Payer: Self-pay | Admitting: Cardiology

## 2010-09-08 ENCOUNTER — Ambulatory Visit (HOSPITAL_COMMUNITY): Admission: RE | Admit: 2010-09-08 | Discharge: 2010-09-08 | Payer: Self-pay | Admitting: Cardiovascular Disease

## 2010-09-10 ENCOUNTER — Ambulatory Visit: Payer: Self-pay | Admitting: Family Medicine

## 2010-09-10 LAB — CONVERTED CEMR LAB: Prothrombin Time: 18.4 s

## 2010-09-13 ENCOUNTER — Encounter: Payer: Self-pay | Admitting: Family Medicine

## 2010-09-24 ENCOUNTER — Ambulatory Visit: Payer: Self-pay | Admitting: Family Medicine

## 2010-09-24 LAB — CONVERTED CEMR LAB: INR: 2.7

## 2010-10-12 ENCOUNTER — Ambulatory Visit: Payer: Self-pay | Admitting: Internal Medicine

## 2010-10-14 ENCOUNTER — Telehealth (INDEPENDENT_AMBULATORY_CARE_PROVIDER_SITE_OTHER): Payer: Self-pay | Admitting: *Deleted

## 2010-10-19 ENCOUNTER — Ambulatory Visit: Payer: Self-pay | Admitting: Family Medicine

## 2010-10-19 LAB — CONVERTED CEMR LAB: Hgb A1c MFr Bld: 6.6 % — ABNORMAL HIGH (ref 4.6–6.5)

## 2010-10-21 ENCOUNTER — Ambulatory Visit: Payer: Self-pay | Admitting: Family Medicine

## 2010-10-21 LAB — HM DIABETES FOOT EXAM

## 2010-10-26 ENCOUNTER — Encounter: Payer: Self-pay | Admitting: Cardiology

## 2010-10-28 ENCOUNTER — Ambulatory Visit: Payer: Self-pay | Admitting: Cardiology

## 2010-10-28 ENCOUNTER — Encounter: Payer: Self-pay | Admitting: Cardiology

## 2010-11-16 ENCOUNTER — Ambulatory Visit
Admission: RE | Admit: 2010-11-16 | Discharge: 2010-11-16 | Payer: Self-pay | Source: Home / Self Care | Attending: Family Medicine | Admitting: Family Medicine

## 2010-11-16 LAB — CONVERTED CEMR LAB: INR: 2.2

## 2010-11-22 ENCOUNTER — Encounter: Payer: Self-pay | Admitting: Family Medicine

## 2010-11-22 ENCOUNTER — Ambulatory Visit
Admission: RE | Admit: 2010-11-22 | Discharge: 2010-11-22 | Payer: Self-pay | Source: Home / Self Care | Attending: Family Medicine | Admitting: Family Medicine

## 2010-12-01 ENCOUNTER — Encounter: Payer: Self-pay | Admitting: Cardiology

## 2010-12-01 ENCOUNTER — Ambulatory Visit
Admission: RE | Admit: 2010-12-01 | Discharge: 2010-12-01 | Payer: Self-pay | Source: Home / Self Care | Attending: Cardiology | Admitting: Cardiology

## 2010-12-08 ENCOUNTER — Encounter: Payer: Self-pay | Admitting: Cardiovascular Disease

## 2010-12-14 ENCOUNTER — Encounter: Payer: Self-pay | Admitting: Cardiology

## 2010-12-14 ENCOUNTER — Ambulatory Visit: Admission: RE | Admit: 2010-12-14 | Discharge: 2010-12-14 | Payer: Self-pay | Source: Home / Self Care

## 2010-12-15 ENCOUNTER — Ambulatory Visit: Payer: Self-pay

## 2010-12-15 ENCOUNTER — Ambulatory Visit: Admit: 2010-12-15 | Payer: Self-pay | Admitting: Family Medicine

## 2010-12-16 ENCOUNTER — Ambulatory Visit (INDEPENDENT_AMBULATORY_CARE_PROVIDER_SITE_OTHER): Payer: Medicare Other

## 2010-12-16 ENCOUNTER — Encounter: Payer: Self-pay | Admitting: Family Medicine

## 2010-12-16 DIAGNOSIS — Z7901 Long term (current) use of anticoagulants: Secondary | ICD-10-CM

## 2010-12-16 DIAGNOSIS — Z5181 Encounter for therapeutic drug level monitoring: Secondary | ICD-10-CM

## 2010-12-16 DIAGNOSIS — I4891 Unspecified atrial fibrillation: Secondary | ICD-10-CM

## 2010-12-16 NOTE — Assessment & Plan Note (Signed)
Summary: F/U DIABETES/CLE   Vital Signs:  Patient profile:   77 year old male Height:      69 inches Weight:      211.25 pounds BMI:     31.31 Temp:     98.3 degrees F oral Pulse rate:   64 / minute Pulse rhythm:   regular BP sitting:   142 / 56  (left arm) Cuff size:   large  Vitals Entered By: Christena Deem CMA Deborra Medina) (October 21, 2010 10:47 AM) CC: F/U - DM   History of Present Illness: Had cardioversion and felt much better after that.  Stable since then.  No CP.   Was in hospital in 9/11 for PNA.  Actos was held at that point.  Since then, glucose increased and then stabilized.  Now with AM sugars  ~150-155. A1c reviewed. We talked about options.  See plan. No hypoglycemia.  Taking sugars regularly.   Allergies: No Known Drug Allergies  Review of Systems       See HPI.  Otherwise negative.    Physical Exam  General:  GEN: nad, alert and oriented HEENT: mucous membranes moist NECK: supple w/o LA, + bruit bilaterally CV: RRR with SEM noted PULM: ctab, no inc wob ABD: soft, +bs EXT: trace edema SKIN: no acute rash   Diabetes Management Exam:    Foot Exam (with socks and/or shoes not present):       Sensory-Pinprick/Light touch:          Left medial foot (L-4): normal          Left dorsal foot (L-5): normal          Left lateral foot (S-1): normal          Right medial foot (L-4): normal          Right dorsal foot (L-5): normal          Right lateral foot (S-1): normal       Sensory-Monofilament:          Left foot: diminished          Right foot: diminished       Inspection:          Left foot: normal          Right foot: normal   Impression & Recommendations:  Problem # 1:  ERECTILE DYSFUNCTION, MILD (ICD-302.72) No change in meds.  Refill done.  No NTG use.  His updated medication list for this problem includes:    Viagra 100 Mg Tabs (Sildenafil citrate) .[name]... 1 tablet 1 hour pior  Orders: Prescription Created Electronically [phone])  Problem #  2:  DIABETES MELLITUS, TYPE II (ICD-250.00) >25 min spent with patient, at least half of which was spent on counseling re:DM2.  Will titrate lantus based on AM sugar and recheck A1c in 3 months.  He agrees. call back as needed.   His updated medication list for this problem includes:    Lantus 100 Unit/ml Soln (Insulin glargine) ..... Inject 20 units p.m. as directed, add 1 unit if am suger >140, decrease 1 unit if am sugar <100    Diovan 320 Mg Tabs (Valsartan) .[name]... 1 tablet po daily  Complete Medication List: 1)  Lantus 100 Unit/ml Soln (Insulin glargine) .... Inject 20 units p.m. as directed, add 1 unit if am suger >140, decrease 1 unit if am sugar <100 2)  Viagra 100 Mg Tabs (Sildenafil citrate) .[name].. 1 tablet 1 hour pior 3)  Norvasc  10 Mg Tabs (Amlodipine besylate) .... Take 1/2 tablet  daily by mouth 4)  Diovan 320 Mg Tabs (Valsartan) .[name].. 1 tablet po daily 5)  Iron 325 (65 Fe) Mg Tabs (Ferrous sulfate) .... 2 tablets by mouth once daily 6)  Sm Insulin Syringe 31g X 5/16" 0.3 Ml Misc (Insulin syringe-needle u-100) .... Use daily as instructed icd-9 code 250.00 7)  Multivitamins Tabs (Multiple vitamin) .[name].. 1 daily by mouth 8)  Fish Oil 1200 Mg Caps (Omega-3 fatty acids) .[name].. 1 twice a day by mouth 9)  Vitamin D3 2000 Unit Caps (Cholecalciferol) .[name].. 1 daily by mouth 10)  K-99 595 Mg Caps (Potassium gluconate) .... Take one tablet by mouth once daily. 11)  Mag-ox 400 400 Mg Tabs (Magnesium oxide) .... Take one tablet by mouth once daily. 12)  Crestor 20 Mg Tabs (Rosuvastatin calcium) .... Take 1 tablet by mouth once a day 13)  Furosemide 80 Mg Tabs (Furosemide) .... Take 1 tablet by mouth every morning 14)  Toprol Xl 50 Mg Xr24h-tab (Metoprolol succinate) .... Take 1 tablet by mouth once a day 15)  Warfarin Sodium 7.5 Mg Tabs (Warfarin sodium) .... A (x5 days) directed 16)  Warfarin Sodium 5 Mg Tabs (Warfarin sodium) .... Take  as directed (x2 days)  Patient Instructions: 1)  Change  your lantus as needed. 2)  If sugar is 101-139, no change in dose. 3)  If sugar is <100, decrease by 1 unit. 4)  If sugar is >140, increase by 1 unit.  5)  I want to see you back in 3 months to recheck you A1c with an OV a few days later.  Prescriptions: VIAGRA 100 MG  TABS (SILDENAFIL CITRATE) 1 TABLET 1 HOUR PIOR  #18 x 4   Entered and Authorized by:   [name] MD   Signed by:   [name] MD on 10/21/2010   Method used:   Faxed to ...       [facility])       [place]  [address]       Ph: [phone]       Fax: [phone]   RxID:   [id number]    Orders Added: 1)  Est. Patient Level IV [id number] 2)  Prescription Created Electronically [phone]    Current Allergies (reviewed today): No known allergies

## 2010-12-16 NOTE — Assessment & Plan Note (Signed)
Summary: 4wk f/u sl      Allergies Added: NKDA  Visit Type:  Follow-up Primary Provider:  Raenette Rover MD  CC:  atrial flutter and atrial fibrillation.  History of Present Illness: Patient is seen for followup of atrial fibrillation, atrial flutter, coronary artery disease.  I saw him last October 28, 2010.  We had noted atrial flutter.  Consideration was given to proceeding with a flutter ablation.  However the patient's rhythm changed to atrial fib.  It was mentioned that tickosyn or amiodarone could be considered. These have not been started.  The patient was cardioverted to sinus rhythm in October, 2011.  He felt quite well at that time.  He says that now he feels fatigued with walking.  There may be vague chest discomfort.  Catheterization in September, 2011 revealed patent grafts from his redo CABG.  It is noted that his beta blocker dose had been increased around the time of his cardioversion.  Current Medications (verified): 1)  Lantus 100 Unit/ml Soln (Insulin Glargine) .... Inject 20 Units P.m. As Directed, Add 1 Unit If Am Suger >140, Decrease 1 Unit If Am Sugar <100 2)  Viagra 100 Mg  Tabs (Sildenafil Citrate) .[name].. 1 Tablet 1 Hour Pior 3)  Norvasc 10 Mg  Tabs (Amlodipine Besylate) .... Take 1/2 Tablet  Daily By Mouth 4)  Diovan 320 Mg Tabs (Valsartan) .[name].. 1 Tablet Po Daily 5)  Iron 325 (65 Fe) Mg Tabs (Ferrous Sulfate) .... 2 Tablets By Mouth Once Daily 6)  Sm Insulin Syringe 31g X 5/16" 0.3 Ml Misc (Insulin Syringe-Needle U-100) .... Use Daily As Instructed Icd-9 Code 250.00 7)  Multivitamins  Tabs (Multiple Vitamin) .[name].. 1 Daily By Mouth 8)  Fish Oil 1200 Mg Caps (Omega-3 Fatty Acids) .[name].. 1 Twice A Day By Mouth 9)  Vitamin D3 2000 Unit Caps (Cholecalciferol) .[name].. 1 Daily By Mouth 10)  K-99 595 Mg Caps (Potassium Gluconate) .... Take One Tablet By Mouth Once Daily. 11)  Mag-Ox 400 400 Mg Tabs (Magnesium Oxide) .... Take One Tablet By Mouth Once Daily. 12)  Crestor 20  Mg Tabs (Rosuvastatin Calcium) .... Take 1 Tablet By Mouth Once A Day 13)  Furosemide 80 Mg Tabs (Furosemide) .... Take 1 Tablet By Mouth Every Morning 14)  Toprol Xl 50 Mg Xr24h-Tab (Metoprolol Succinate) .... Take 1 Tablet By Mouth Once A Day 15)  Warfarin Sodium 7.5 Mg Tabs (Warfarin Sodium) .... A (X5 Days) Directed 16)  Warfarin Sodium 5 Mg Tabs (Warfarin Sodium) .... Take  As Directed (X2 Days)  Allergies (verified): No Known Drug Allergies  Past History:  Past Medical History: DIABETIC PERIPHERAL NEUROPATHY (ICD-250.60) RBBB  BUNDLE BRANCH BLOCK, RIGHT (ICD-426.4) GASTROESOPHAGEAL REFLUX DISEASE (ICD-530.81). MACULAR DEGENERATION, HX OF (ICD-V12.49) SLEEP APNEA (ICD-780.57).. SYSTOLIC MURMUR (99991111.2) CAROTID ARTERY DISEASE (ICD-433.10)..Doppler... 11/5 2010... 49% bilateral stenoses SPECIAL SCREENING MALIGNANT NEOPLASM OF PROSTATE (ICD-V76.44) COLONIC POLYPS, BENIGN, HX OF (ICD-V12.72) ANEMIA, SECONDARY TO ACUTE BLOOD LOSS (ICD-285.1) Hx of DIVERTICULOSIS, COLON, WITH HEMORRHAGE (ICD-562.12) CAD (ICD-414.00)....catheterization 2006 /  nuclear.. slight lateral ischemia... medical therapy  /   cath...08/04/2010...grafts patent from redo CABG...medical Rx and ablate Atrial flutter (LV not injected) EF   60%... echo... October, 2009 /  65-70%...echo.Marland KitchenMarland Kitchen9/19/2011 MR   mild...echo...07/2010 CABG 1995..  redo CABG 2006 GI bleeding... severe... 2009... multiple units of blood transfused... patient off aspirin Aspirin.... not taking because of prior GI bleeding SPECIAL SCREENING MALIG NEOPLASMS OTHER SITES (ICD-V76.49) ANEMIA (ICD-285.9) ALLERGY, ENVIRONMENTAL (ICD-995.3) COLONIC POLYPS, BENIGN (ICD-211.3) ERECTILE DYSFUNCTION, MILD (  ICD-302.72) HYPERTENSION (ICD-401.9) HYPERLIPIDEMIA (ICD-272.4) .[name] low HDL DIVERTICULOSIS, COLON (ICD-562.10) DIABETES MELLITUS, TYPE II (ICD-250.00) Edema.. Muscle aches from crestor PNA and NSTEMI 9/11 at [facility] with repeat cath, rec medical  mgmt Atrial Flutter   hospital 07/2010 with PNA and cath done.Marland KitchenMarland KitchenCoumadin. /   .Atrial flutter ablation planned, but patient then had atrial fibrillation,  /    outpatient cardioversion September 08, 2010..NSR... plan to follow.. Dr. Caryl Comes  Review of Systems       Patient denies fever, chills, headache, sweats, rash, change in vision, change in hearing, chest pain, cough, nausea vomiting, urinary symptoms.  All other systems are reviewed and are negative  Vital Signs:  Patient profile:   77 year old male Height:      69 inches Weight:      210 pounds BMI:     31.12 Pulse rate:   65 / minute BP sitting:   148 / 62  (left arm) Cuff size:   regular  Vitals Entered By: Mignon Pine, RMA (December 01, 2010 10:44 AM)  Physical Exam  General:  patient is stable today.  He is overweight. Head:  head is atraumatic. Eyes:  no xanthelasma. Neck:  no jugular venous distention. A right carotid bruit is heard. Chest Wall:  no chest wall tenderness. Lungs:  lungs are clear.  Respiratory effort is nonlabored. Heart:  cardiac exam reveals an S1-S2.  The rhythm is regular but there are premature beats Abdomen:  abdomen is protuberant. Msk:  no musculoskeletal deformities.   Extremities:  no significant peripheral edema. Skin:  no skin rashes. Psych:  patient is oriented to person time and place.  Affect is normal.   Impression & Recommendations:  Problem # 1:  ATRIAL FIBRILLATION (ICD-427.31)  His updated medication list for this problem includes:    Toprol Xl 50 Mg Xr24h-tab (Metoprolol succinate) .[name]... Take 1 tablet by mouth once a day    Warfarin Sodium 7.5 Mg Tabs (Warfarin sodium) .[name]... A (x5 days) directed    Warfarin Sodium 5 Mg Tabs (Warfarin sodium) .[name]... Take  as directed (x2 days) EKG is done today and reviewed by me.  He does not have atrial fibrillation flutter.  He has old right bundle branch block.  There is normal sinus rhythm with PACs.  Orders: Treadmill  (Treadmill)  Problem # 2:  EDEMA (ICD-782.3) No significant edema at this time.  No change in therapy.  Problem # 3:  CAROTID ENDARTERECTOMY, HX OF (ICD-V15.1)  The patient has a right carotid bruit.  It has been approximately one year since his prior Doppler.  It is actually greater than one year.  We will be sure that he has a followup arranged.  Orders: Carotid Duplex (Carotid Duplex)  Problem # 4:  CAD (ICD-414.00)  His updated medication list for this problem includes:    Norvasc 10 Mg Tabs (Amlodipine besylate) .[name]... Take 1/2 tablet  daily by mouth    Toprol Xl 50 Mg Xr24h-tab (Metoprolol succinate) .[name]... Take 1 tablet by mouth once a day    Warfarin Sodium 7.5 Mg Tabs (Warfarin sodium) .[name]... A (x5 days) directed    Warfarin Sodium 5 Mg Tabs (Warfarin sodium) .[name]... Take  as directed (x2 days) Coronary disease was stable by his death in 02/25/2010.  Currently he is having some fatigue and slight chest heaviness.  I am not convinced that this represents ischemia.  I am wondering if he could have chronotropic incompetence.  I will arrange for him to have a  standard treadmill on all of his meds to see his heart rate response.  He does feel some palpitations.  I believe this is from his PACs. The patient does not take aspirin because of a significant GI bleed in the past probably from his diverticular disease.  He remains on Coumadin but no aspirin.  Problem # 5:  HYPERTENSION (ICD-401.9)  His updated medication list for this problem includes:    Norvasc 10 Mg Tabs (Amlodipine besylate) .[name]... Take 1/2 tablet  daily by mouth    Diovan 320 Mg Tabs (Valsartan) .[name]... 1 tablet po daily    Furosemide 80 Mg Tabs (Furosemide) .[name]... Take 1 tablet by mouth every morning    Toprol Xl 50 Mg Xr24h-tab (Metoprolol succinate) .[name]... Take 1 tablet by mouth once a day Blood pressure is controlled.  No change in therapy.  Other Orders: EKG w/ Interpretation (93000)  Patient Instructions: 1)  Your physician  has requested that you have a carotid duplex. This test is an ultrasound of the carotid arteries in your neck. It looks at blood flow through these arteries that supply the brain with blood. Allow one hour for this exam. There are no restrictions or special instructions. 2)  Your physician has requested that you have an exercise tolerance test.  For further information please visit [email or web address].  Please also follow instruction sheet, as given.

## 2010-12-16 NOTE — Progress Notes (Signed)
Summary: wants labs done prior to appt  Phone Note Call from Patient Call back at Home Phone [phone]   Caller: Patient Call For: Dr.Duncan  Summary of Call: Patient has an appt on 10-21-10 for a f/u for diabetes. He is asking if he could have an A1C prior to appt. so that you will have results when he comes in. Please advise.  Initial call taken by: Lacretia Nicks,  October 14, 2010 10:52 AM  Follow-up for Phone Call        please get A1c drawn a few days before OV.  thanks.  250.00. Follow-up by: [name] MD,  October 14, 2010 1:27 PM  Additional Follow-up for Phone Call Additional follow up Details #1::        lab appt scheduled, 10-19-2010,  Additional Follow-up by: Selinda Orion,  October 14, 2010 3:33 PM

## 2010-12-16 NOTE — Letter (Signed)
Summary: Letter of Medical Necessity-CPAP [place]  Letter of Medical Necessity-CPAP [place]   Imported By: Virgia Land 02/15/2010 15:27:51  _____________________________________________________________________  External Attachment:    Type:   Image     Comment:   External Document

## 2010-12-16 NOTE — Letter (Signed)
Summary: Cardioversion/TEE Instructions  Press photographer, [place] [address] [place]   [place], [place] [address]   Phone: [phone]  Fax: [phone]    Cardioversion Instructions  09/06/2010 MRN: [id number]  [place], [place]  [address]  Dear Mr. KOSSMAN, You are scheduled for a Cardioversion on September 08, 2010 with Dr. Johnsie Cancel.  __  Please arrive at the [place] [facility] at 10:00 a.m. [name] on the day of your procedure.  1)   DIET:    Nothing to eat or drink after midnight except your medications with a sip of water.   2)   Come to the [place] office on September 07, 2010 for lab work. The lab at [facility] is open from 8:30 a.m. to 1:30 p.m. and 2:30 p.m. to 5:00 p.m.  You do not have to be fasting.  3)   MAKE SURE YOU TAKE YOUR COUMADIN.  4)   A)   DO NOT TAKE these medications before your procedure:      Take half your usual insulin dose night before procedure.  B)   YOU MAY TAKE ALL of your remaining medications with a small amount of water.    5)  Must have a responsible person to drive you home.  6)   Bring a current list of your medications and current insurance cards.   * Special Note:  Every effort is made to have your procedure done on time. Occasionally there are emergencies that present themselves at the hospital that may cause delays. Please be patient if a delay does occur.  * If you have any questions after you get home, please call the office at [phone].

## 2010-12-16 NOTE — Assessment & Plan Note (Signed)
Summary: F/U [facility]/DR SCHALLER'S PT/CLE   Vital Signs:  Patient profile:   77 year old male Height:      69 inches Weight:      212 pounds BMI:     31.42 Temp:     98.5 degrees F oral Pulse rate:   80 / minute Pulse rhythm:   regular BP sitting:   120 / 56  (left arm) Cuff size:   large  Vitals Entered By: Christena Deem CMA Deborra Medina) (August 09, 2010 10:57 AM) CC: F/U [facility].  Records attached.   History of Present Illness: Prev cath- rec medical mgmt.  No CP in interval.  Not short of breath.   Aflutter, episodes more pronounced now but not every day.  Started on coumadin.  No CP.  Off ASA due to h/o GIB.  No bleeding, bruising in meantime since discharge from [facility].  PNA- was on avelox.  last pill was today.  Still with some fatigue.  no sputum.   DM- A1c was 6.1 in hosptial.  Actos had been stopped in hosptial.  increase in glucose since coming home.  Has lantus syringe.  Was taking 15 units a day of lantus.    Hospital recs reviewed.   Allergies: No Known Drug Allergies  Past History:  Past Medical History: DIABETIC PERIPHERAL NEUROPATHY (ICD-250.60) RBBB  BUNDLE BRANCH BLOCK, RIGHT (ICD-426.4) GASTROESOPHAGEAL REFLUX DISEASE (ICD-530.81) MACULAR DEGENERATION, HX OF (ICD-V12.49) SLEEP APNEA (Q000111Q) SYSTOLIC MURMUR (99991111.2) CAROTID ARTERY DISEASE (ICD-433.10)..Doppler... 11/5 2010... 49% bilateral stenoses SPECIAL SCREENING MALIGNANT NEOPLASM OF PROSTATE (ICD-V76.44) COLONIC POLYPS, BENIGN, HX OF (ICD-V12.72) ANEMIA, SECONDARY TO ACUTE BLOOD LOSS (ICD-285.1) Hx of DIVERTICULOSIS, COLON, WITH HEMORRHAGE (ICD-562.12) CAD (ICD-414.00)....catheterization 2006 /  nuclear.. slight lateral ischemia... medical therapy EF   60%... echo... October, 2009 and a CABG 1995..  redo CABG 2006 GI bleeding... severe... 2009... multiple units of blood transfused... patient off aspirin Aspirin.... not taking because of prior GI bleeding SPECIAL SCREENING MALIG  NEOPLASMS OTHER SITES (ICD-V76.49) ANEMIA (ICD-285.9) ALLERGY, ENVIRONMENTAL (ICD-995.3) COLONIC POLYPS, BENIGN (ICD-211.3) ERECTILE DYSFUNCTION, MILD (ICD-302.72) HYPERTENSION (ICD-401.9) HYPERLIPIDEMIA (ICD-272.4) .[name] low HDL DIVERTICULOSIS, COLON (ICD-562.10) DIABETES MELLITUS, TYPE II (ICD-250.00) Edema Muscle aches from crestor PNA and NSTEMI 9/11 at [facility] with repeat cath, rec medical mgmt  Family History: Reviewed history from 01/25/2010 and no changes required. Father: dec 65, MI at 32 Mother: dec 73 Kidney Failure CVA, DM Sister A 22 Arthritis Throat Ca Sister A 20 MI DM No FH of Colon Cancer:  Social History: Reviewed history from 01/25/2010 and no changes required. 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  Otherwise negative.    Physical Exam  General:  GEN: nad, alert and oriented HEENT: mucous membranes moist NECK: supple w/o LA, + bruit bilaterally CV: IRR with SEM noted PULM: ctab, no inc wob ABD: soft, +bs EXT: trace edema SKIN: no acute rash    Impression & Recommendations:  Problem # 1:  CAD (ICD-414.00) >25 min spent with patient, at least half of which was spent on counseling [id number] and dx.  continue current meds.  CP free and has follow up with cards.   The following medications were removed from the medication list:    Furosemide 40 Mg Tabs (Furosemide) .Marland KitchenMarland KitchenMarland KitchenMarland Kitchen  1 tablet on sat. and sun. 1/2 tablet other days    Toprol Xl 25 Mg Xr24h-tab (Metoprolol succinate) .[name]... 1 daily by once daily His updated medication list for this problem includes:    Norvasc 10 Mg Tabs (Amlodipine besylate) .[name]... Take 1/2 tablet  daily by mouth    Diovan 320 Mg Tabs (Valsartan) .[name]... 1 tablet po daily    Furosemide 80 Mg Tabs (Furosemide) .[name]...  Take 1 tablet by mouth every morning    Toprol Xl 50 Mg Xr24h-tab (Metoprolol succinate) .[name]... Take 1 tablet by mouth once a day  Problem # 2:  DIABETES MELLITUS, TYPE II (ICD-250.00) titrate lantus and do not restart actos.  call back as needed [id number] control in the next few days.  I expect this to normalize.  D/w patient re:dec in weight.  The following medications were removed from the medication list:    Actos 30 Mg Tabs (Pioglitazone hcl) .[name]... Take 1/2 by mouth two times a day His updated medication list for this problem includes:    Lantus 100 Unit/ml Soln (Insulin glargine) ..... Inject 35 units subcutaneously as directed    Diovan 320 Mg Tabs (Valsartan) .[name]... 1 tablet po daily  Complete Medication List: 1)  Lantus 100 Unit/ml Soln (Insulin glargine) .... Inject 35 units subcutaneously as directed 2)  Viagra 100 Mg Tabs (Sildenafil citrate) .[name].. 1 tablet 1 hour pior 3)  Norvasc 10 Mg Tabs (Amlodipine besylate) .... Take 1/2 tablet  daily by mouth 4)  Diovan 320 Mg Tabs (Valsartan) .[name].. 1 tablet po daily 5)  Iron 325 (65 Fe) Mg Tabs (Ferrous sulfate) .... 2 tablets by mouth once daily 6)  Sm Insulin Syringe 31g X 5/16" 0.3 Ml Misc (Insulin syringe-needle u-100) .... Use daily as instructed icd-9 code 250.00 7)  Multivitamins Tabs (Multiple vitamin) .[name].. 1 daily by mouth 8)  Fish Oil 1200 Mg Caps (Omega-3 fatty acids) .[name].. 1 twice a day by mouth 9)  Vitamin D3 2000 Unit Caps (Cholecalciferol) .[name].. 1 daily by mouth 10)  K-99 595 Mg Caps (Potassium gluconate) .... Take one tablet by mouth once daily. 11)  Mag-ox 400 400 Mg Tabs (Magnesium oxide) .... Take one tablet by mouth once daily. 12)  Crestor 20 Mg Tabs (Rosuvastatin calcium) .... Take 1 tablet by mouth once a day 13)  Furosemide 80 Mg Tabs (Furosemide) .... Take 1 tablet by mouth every morning 14)  Toprol Xl 50 Mg Xr24h-tab (Metoprolol succinate) .... Take 1 tablet by mouth once a day 15)  Warfarin Sodium 7.5 Mg Tabs  (Warfarin sodium) .... Take 1 tablet by mouth once a day  Patient Instructions: 1)  If your sugar is 119 or below in the morning, decrease your lantus dose by 1 unit. 2)  If your sugar is 141 or above in the morning, increase your lantus dose by 1 unit. 3)  If your sugar is 120-140, don't change your lantus dose. 4)  Take care.  I was glad to see you today.   5)  See Terri about your recheck for coumadin.  Prescriptions: FUROSEMIDE 80 MG TABS (FUROSEMIDE) Take 1 tablet by mouth every morning  #90 x 3   Entered by:   Christena Deem CMA (AAMA)   Authorized by:   [name] MD   Signed by:   Christena Deem CMA ([place]) on 08/09/2010   Method used:   Faxed to ...       [facility])       [address]  [place]  [address]       Ph: [phone]       Fax: [phone]   RxID:   [id number] SM INSULIN SYRINGE 31G X 5/16" 0.3 ML MISC (INSULIN SYRINGE-NEEDLE U-100) use daily as instructed icd-9 code 250.00  #100 x 3   Entered by:   Christena Deem CMA ([place])   Authorized by:   [name] MD   Signed by:   Christena Deem CMA ([place]) on 08/09/2010   Method used:   Faxed to ...       [facility]ation [facility])       [place]  [address]       Ph: [phone]       Fax: [phone]   [id number]   Current Allergies (reviewed today): No known allergies

## 2010-12-16 NOTE — Medication Information (Signed)
Summary: rov/ewj      Allergies Added: NKDA Anticoagulant Therapy  Managed by: Gypsy Lore, PharmD Referring MD: Dola Argyle, MD PCP: Raenette Rover MD Supervising MD: Johnsie Cancel MD, Collier Salina Indication 1: Atrial fibrillation Lab Used: [facility] [place] Site: [place] INR POC 2.4 INR RANGE 2.0-3.0  Dietary changes: no    Health status changes: no    Bleeding/hemorrhagic complications: no    Recent/future hospitalizations: no    Any changes in medication regimen? no    Recent/future dental: no  Any missed doses?: yes     Details: Missed 2 doses the week before last due to stomach flu.   Is patient compliant with meds? yes      Comments: Pt follows at [facility]. INR check today per visit with Dr. [name].  Pt reports taking Coumadin 7.5 mg x 4 days/week and Coumadin 5 mg x 3 days/week.  Current Medications (verified): 1)  Lantus 100 Unit/ml Soln (Insulin Glargine) .... Inject 35 Units Subcutaneously As Directed 2)  Viagra 100 Mg  Tabs (Sildenafil Citrate) .[name].. 1 Tablet 1 Hour Pior 3)  Norvasc 10 Mg  Tabs (Amlodipine Besylate) .... Take 1/2 Tablet  Daily By Mouth 4)  Diovan 320 Mg Tabs (Valsartan) .[name].. 1 Tablet Po Daily 5)  Iron 325 (65 Fe) Mg Tabs (Ferrous Sulfate) .... 2 Tablets By Mouth Once Daily 6)  Sm Insulin Syringe 31g X 5/16" 0.3 Ml Misc (Insulin Syringe-Needle U-100) .... Use Daily As Instructed Icd-9 Code 250.00 7)  Multivitamins  Tabs (Multiple Vitamin) .[name].. 1 Daily By Mouth 8)  Fish Oil 1200 Mg Caps (Omega-3 Fatty Acids) .[name].. 1 Twice A Day By Mouth 9)  Vitamin D3 2000 Unit Caps (Cholecalciferol) .[name].. 1 Daily By Mouth 10)  K-99 595 Mg Caps (Potassium Gluconate) .... Take One Tablet By Mouth Once Daily. 11)  Mag-Ox 400 400 Mg Tabs (Magnesium Oxide) .... Take One Tablet By Mouth Once Daily. 12)  Crestor 20 Mg Tabs (Rosuvastatin Calcium) .... Take 1 Tablet By Mouth Once A Day 13)  Furosemide 80 Mg Tabs (Furosemide) .... Take 1 Tablet By Mouth  Every Morning 14)  Toprol Xl 50 Mg Xr24h-Tab (Metoprolol Succinate) .... Take 1 Tablet By Mouth Once A Day 15)  Warfarin Sodium 7.5 Mg Tabs (Warfarin Sodium) .... Take 1 Tablet By Mouth Once A Day 16)  Warfarin Sodium 5 Mg Tabs (Warfarin Sodium) .... Take  As Directed  Allergies (verified): No Known Drug Allergies  Anticoagulation Management History:      The patient is taking warfarin and comes in today for a routine follow up visit.  Positive risk factors for bleeding include an age of 75 years or older, history of GI bleeding, and presence of serious comorbidities.  The bleeding index is 'high risk'.  Positive CHADS2 values include History of HTN, Age > 75 years old, and History of Diabetes.  His last INR was 2.7.  Anticoagulation responsible provider: Johnsie Cancel MD, Collier Salina.  INR POC: 2.4.  Cuvette Lot#: [id number].    Anticoagulation Management Assessment/Plan:      The patient's current anticoagulation dose is Warfarin sodium 7.5 mg tabs: Take 1 tablet by mouth once a day, Warfarin sodium 5 mg tabs: take  as directed.  The next INR is due 2 weeks.  Anticoagulation instructions were given to patient.  Results were reviewed/authorized by Gypsy Lore, PharmD.  He was notified by Gypsy Lore PharmD.         Current Anticoagulation Instructions: INR 2.4  Continue  taking Coumadin as directed by anticoagulation clinic.

## 2010-12-16 NOTE — Progress Notes (Signed)
Summary: clarification on syringe script  Phone Note From Pharmacy   Caller: [facility] Summary of Call: Pharmacy is asking for clarification on syringe script.  Forms are on your desk. Initial call taken by: Marty Heck CMA,  August 17, 2010 11:22 AM  Follow-up for Phone Call        form signed.  Follow-up by: [name] MD,  August 17, 2010 2:13 PM  Additional Follow-up for Phone Call Additional follow up Details #1::        Faxed. Additional Follow-up by: Christena Deem CMA Deborra Medina),  August 17, 2010 3:03 PM

## 2010-12-16 NOTE — Assessment & Plan Note (Signed)
Summary: EPH/D/C 2WEEKS AGO      Allergies Added: NKDA  Visit Type:  post hospital visit Primary Provider:  Raenette Rover MD  CC:  atrial flutter and CAD.  History of Present Illness: The patient is seen for followup of atrial flutter and coronary artery disease.  He is stable today.  Hospitalized in September, 2011 with a pneumonia.  There was slight enzyme evaluation.  Cardiac catheterization was done in his grafts were patent from his redo CABG.  Medical therapy was recommended.  The patient was in atrial flutter.  He was seen by Dr.Klein who spoke to me about the case.  It was felt that he should be coumadinized and then return for atrial flutter ablation..  It was felt that the patient could return directly for atrial flutter ablation without being seen in the electrophysiology office.  I chose to make arrangements to see him in my office to be sure that everything was arranged including his Coumadin.  He has been having his INRs checked regularly. INR on September 26 was 1.4.  September 30, INR 2.2.  October 7, INR 2.7.   October 20, INR 2.4.  As of today I believe that his atrial flutter ablation has not yet been scheduled.  Current Medications (verified): 1)  Lantus 100 Unit/ml Soln (Insulin Glargine) .... Inject 35 Units Subcutaneously As Directed 2)  Viagra 100 Mg  Tabs (Sildenafil Citrate) .[name].. 1 Tablet 1 Hour Pior 3)  Norvasc 10 Mg  Tabs (Amlodipine Besylate) .... Take 1/2 Tablet  Daily By Mouth 4)  Diovan 320 Mg Tabs (Valsartan) .[name].. 1 Tablet Po Daily 5)  Iron 325 (65 Fe) Mg Tabs (Ferrous Sulfate) .... 2 Tablets By Mouth Once Daily 6)  Sm Insulin Syringe 31g X 5/16" 0.3 Ml Misc (Insulin Syringe-Needle U-100) .... Use Daily As Instructed Icd-9 Code 250.00 7)  Multivitamins  Tabs (Multiple Vitamin) .[name].. 1 Daily By Mouth 8)  Fish Oil 1200 Mg Caps (Omega-3 Fatty Acids) .[name].. 1 Twice A Day By Mouth 9)  Vitamin D3 2000 Unit Caps (Cholecalciferol) .[name].. 1 Daily By Mouth 10)  K-99  595 Mg Caps (Potassium Gluconate) .... Take One Tablet By Mouth Once Daily. 11)  Mag-Ox 400 400 Mg Tabs (Magnesium Oxide) .... Take One Tablet By Mouth Once Daily. 12)  Crestor 20 Mg Tabs (Rosuvastatin Calcium) .... Take 1 Tablet By Mouth Once A Day 13)  Furosemide 80 Mg Tabs (Furosemide) .... Take 1 Tablet By Mouth Every Morning 14)  Toprol Xl 50 Mg Xr24h-Tab (Metoprolol Succinate) .... Take 1 Tablet By Mouth Once A Day 15)  Warfarin Sodium 7.5 Mg Tabs (Warfarin Sodium) .... Take 1 Tablet By Mouth Once A Day 16)  Warfarin Sodium 5 Mg Tabs (Warfarin Sodium) .... Take  As Directed  Allergies (verified): No Known Drug Allergies  Past History:  Past Medical History: DIABETIC PERIPHERAL NEUROPATHY (ICD-250.60) RBBB  BUNDLE BRANCH BLOCK, RIGHT (ICD-426.4) GASTROESOPHAGEAL REFLUX DISEASE (ICD-530.81). MACULAR DEGENERATION, HX OF (ICD-V12.49) SLEEP APNEA (Q000111Q) SYSTOLIC MURMUR (99991111.2) CAROTID ARTERY DISEASE (ICD-433.10)..Doppler... 11/5 2010... 49% bilateral stenoses SPECIAL SCREENING MALIGNANT NEOPLASM OF PROSTATE (ICD-V76.44) COLONIC POLYPS, BENIGN, HX OF (ICD-V12.72) ANEMIA, SECONDARY TO ACUTE BLOOD LOSS (ICD-285.1) Hx of DIVERTICULOSIS, COLON, WITH HEMORRHAGE (ICD-562.12) CAD (ICD-414.00)....catheterization 2006 /  nuclear.. slight lateral ischemia... medical therapy  /   cath...08/04/2010...grafts patent from redo CABG...medical Rx and ablate Atrial flutter (LV not injected) EF   60%... echo... October, 2009 /  65-70%...echo.Marland KitchenMarland Kitchen9/19/2011 MR   mild...echo...07/2010 CABG 1995..  redo CABG 2006 GI  bleeding... severe... 2009... multiple units of blood transfused... patient off aspirin Aspirin.... not taking because of prior GI bleeding SPECIAL SCREENING MALIG NEOPLASMS OTHER SITES (ICD-V76.49) ANEMIA (ICD-285.9) ALLERGY, ENVIRONMENTAL (ICD-995.3) COLONIC POLYPS, BENIGN (ICD-211.3) ERECTILE DYSFUNCTION, MILD (ICD-302.72) HYPERTENSION (ICD-401.9) HYPERLIPIDEMIA (ICD-272.4) .[name] low  HDL DIVERTICULOSIS, COLON (ICD-562.10) DIABETES MELLITUS, TYPE II (ICD-250.00) Edema Muscle aches from crestor PNA and NSTEMI 9/11 at [facility] with repeat cath, rec medical mgmt Atrial Flutter   hospital 07/2010 with PNA and cath done.Marland KitchenMarland KitchenCoumadin..Atrial flutter ablation to be done  Review of Systems       Patient denies fever, chills, headache, sweats, rash, change in vision, change in hearing, chest pain, cough, nausea vomiting, urinary symptoms.  All other systems are reviewed and are negative.  Vital Signs:  Patient profile:   77 year old male Height:      69 inches Weight:      215 pounds BMI:     31.86 Pulse rate:   64 / minute BP sitting:   144 / 62  (left arm) Cuff size:   regular  Vitals Entered By: Mignon Pine, RMA (September 02, 2010 10:08 AM)  Physical Exam  General:  The patient looks quite good today.  He does mention increased heart rate. Head:  head is atraumatic. Eyes:  no xanthelasma. Neck:  njugular venous distention. Chest Wall:  no chest wall tenderness. Lungs:  lungs are clear.  Respiratory effort is nonlabored. Heart:  cardiac exam reveals S1-S2.  No clicks or significant murmurs. Abdomen:  abdomen is soft. Msk:  no musculoskeletal deformities. Extremities:  no peripheral edema. Skin:  no skin rashes. Psych:  patient is oriented to person time and place.  Affect is normal.   Impression & Recommendations:  Problem # 1:  ENCOUNTER FOR LONG-TERM USE OF ANTICOAGULANTS (ICD-V58.61) Patient is being coumadinized.  His INR has been documented at greater than 2.0 since September 30.  He will be ready soon for atrial flutter ablation if and when it can be arranged.  Problem # 2:  EDEMA (ICD-782.3) The patient does have mild edema today.  There is no significant change.  I will not change his medicines today.  Problem # 3:  CAD (ICD-414.00)  His updated medication list for this problem includes:    Norvasc 10 Mg Tabs (Amlodipine besylate) .[name]... Take 1/2  tablet  daily by mouth    Toprol Xl 50 Mg Xr24h-tab (Metoprolol succinate) .[name]... Take 1 tablet by mouth once a day    Warfarin Sodium 7.5 Mg Tabs (Warfarin sodium) .[name]... Take 1 tablet by mouth once a day    Warfarin Sodium 5 Mg Tabs (Warfarin sodium) .[name]... Take  as directed Coronary disease is stable.  We know from his recent catheterization that his grafts are patent.  EKG is done today and reviewed by me.  He has old right bundle branch block.  Problem # 4:  ATRIAL FLUTTER (ICD-427.32)  His updated medication list for this problem includes:    Toprol Xl 50 Mg Xr24h-tab (Metoprolol succinate) .[name]... Take 1 tablet by mouth once a day    Warfarin Sodium 7.5 Mg Tabs (Warfarin sodium) .[name]... Take 1 tablet by mouth once a day    Warfarin Sodium 5 Mg Tabs (Warfarin sodium) .[name]... Take  as directed EKG today shows a rhythm that is either atrial fib or atrial flutter.  We had planned to proceed with a flutter ablation.  The patient needs to be seen by Dr.Klein in the office first to check his EKG  again and to be sure that we can proceed with flutter ablation.  If this can be done I'm hopefully can be scheduled very soon as this was the original plan and the patient does have some symptoms.  Other Orders: EKG w/ Interpretation (93000)  Patient Instructions: 1)  Your INR reading (coumadin) is 2.4 today, please continue your current coumadin dosing and call your pcp to sch a recheck in 1 week.  2)  You have been referred to Dr Caryl Comes, your appointment is on Monday 10/24 at 3:30pm 3)  Follow up in 8 weeks   Appended Document: refills    Clinical Lists Changes  Medications: Rx of DIOVAN 320 MG TABS (VALSARTAN) 1 tablet po daily;  #90 x 3;  Signed;  Entered by: Kevan Rosebush, RN;  Authorized by: Lorenza Evangelist, MD, [facility];  Method used: Faxed to [facility], [address] [address], [place]  [address], Ph: [phone], Fax: [phone] Rx of NORVASC 10 MG  TABS (AMLODIPINE BESYLATE) Take 1/2 tablet   daily by mouth;  #90 x 3;  Signed;  Entered by: Kevan Rosebush, RN;  Authorized by: Lorenza Evangelist, MD, [facility];  Method used: Faxed to [facility], [address] [address], [place]  [address], Ph: [phone], Fax: [phone] Rx of TOPROL XL 50 MG XR24H-TAB (METOPROLOL SUCCINATE) Take 1 tablet by mouth once a day;  #90 x 3;  Signed;  Entered by: Kevan Rosebush, RN;  Authorized by: Lorenza Evangelist, MD, [facility];  Method used: Faxed to [facility], [address] [address], [place]  [address], Ph: [phone], Fax: [phone]    Prescriptions: TOPROL XL 50 MG XR24H-TAB (METOPROLOL SUCCINATE) Take 1 tablet by mouth once a day  #90 x 3   Entered by:   Kevan Rosebush, RN   Authorized by:   Lorenza Evangelist, MD, [facility]   Signed by:   Kevan Rosebush, RN on 09/02/2010   Method used:   Faxed to ...       [facility] Probation [place]  [address]       Ph: [phone]       Fax: [phone]   RxID:   [id number] NORVASC 10 MG  TABS (AMLODIPINE BESYLATE) Take 1/2 tablet  daily by mouth  #90 x 3   Entered by:   Kevan Rosebush, RN   Authorized by:   Lorenza Evangelist, MD, [facility]   Signed by:   Kevan Rosebush, RN on 09/02/2010   Method used:   Faxed to ...       [facility] Probation officer)       [place]  [address]       Ph: [phone]       Fax: [phone]   RxID:   [id number] MG TABS (VALSARTAN) 1 tablet po daily  #90 x 3   Entered by:   Kevan Rosebush, RN   Authorized by:   Lorenza Evangelist, MD, [facility]   Signed by:   Kevan Rosebush, RN on 09/02/2010   Method used:   Faxed to ...       [facility] Probation officer)       [place]  [address]       Ph: [phone]       Fax: [phone]   RxID:   [id number]

## 2010-12-16 NOTE — Assessment & Plan Note (Signed)
Summary: eph-mb      Allergies Added: NKDA  Visit Type:  eph Primary Provider:  Raenette Rover MD  CC:  no complaints.  History of Present Illness: Mr Leavins  is seen in followup for atrial fibrillation previously with atrial flutter and coronary artery disease previously identified when he was hospitalized in September, 2011 with a pneumonia.  There was slight enzyme evaluation.  Cardiac catheterization was done in his grafts were patent from his redo CABG.  Medical therapy was recommended.  We saw him in hospital and decided to coumadinize him and bring him back for ablation. He was seen by Dr. [name] last week and was found to be in atrial fibrillation. His INRs have been therapeutic. He was admitted for cardioversion.  He felt immediately better and his exercise tolerance is vastly improved  he is concerned that his blood sugars have gone up since we stopped his Actos  Problems Prior to Update: 1)  Atrial Fibrillation  (ICD-427.31) 2)  Atrial Flutter  (ICD-427.32) 3)  Encounter For Therapeutic Drug Monitoring  (ICD-V58.83) 4)  Encounter For Long-term Use of Anticoagulants  (ICD-V58.61) 5)  Atrial Fibrillation  (ICD-427.31) 6)  Muscle Cramps, Foot  (ICD-729.82) 7)  Edema  (ICD-782.3) 8)  Polypectomy, Hx of  (ICD-V15.9) 9)  Cataract Extraction, Hx of  (ICD-V45.61) 10)  Vasectomy, Hx of  (ICD-V26.52) 11)  Tonsillectomy, Hx of  (ICD-V45.79) 12)  Diabetic Peripheral Neuropathy  (ICD-250.60) 13)  Anemia, Hx of  (ICD-V12.3) 14)  Carotid Endarterectomy, Hx of  (ICD-V15.1) 15)  Coronary Artery Bypass Graft, Hx of  (ICD-V45.81) 16)  Bundle Branch Block, Right  (ICD-426.4) 17)  Gastroesophageal Reflux Disease  (ICD-530.81) 18)  Cholecystectomy, Hx of  (ICD-V45.79) 19)  Macular Degeneration, Hx of  (ICD-V12.49) 20)  Sleep Apnea  (Q000111Q) 21)  Systolic Murmur  ([id number]) 22)  Carotid Artery Disease  (ICD-433.10) 23)  Special Screening Malignant Neoplasm of Prostate   (ICD-V76.44) 24)  Colonic Polyps, Benign, Hx of  (ICD-V12.72) 25)  Anemia, Secondary To Acute Blood Loss  (ICD-285.1) 26)  Hx of Diverticulosis, Colon, With Hemorrhage  (ICD-562.12) 27)  Cad  (ICD-414.00) 28)  Special Screening Malig Neoplasms Other Sites  (ICD-V76.49) 29)  Anemia  (ICD-285.9) 30)  Allergy, Environmental  (ICD-995.3) 31)  Colonic Polyps, Benign  (ICD-211.3) 32)  Erectile Dysfunction, Mild  (ICD-302.72) 33)  Hypertension  (ICD-401.9) 34)  Hyperlipidemia  (ICD-272.4) 35)  Diverticulosis, Colon  (ICD-562.10) 36)  Diabetes Mellitus, Type II  (ICD-250.00)  Current Medications (verified): 1)  Lantus 100 Unit/ml Soln (Insulin Glargine) .... Inject 35 Units P.m. As Directed 2)  Viagra 100 Mg  Tabs (Sildenafil Citrate) .[name].. 1 Tablet 1 Hour Pior 3)  Norvasc 10 Mg  Tabs (Amlodipine Besylate) .... Take 1/2 Tablet  Daily By Mouth 4)  Diovan 320 Mg Tabs (Valsartan) .[name].. 1 Tablet Po Daily 5)  Iron 325 (65 Fe) Mg Tabs (Ferrous Sulfate) .... 2 Tablets By Mouth Once Daily 6)  Sm Insulin Syringe 31g X 5/16" 0.3 Ml Misc (Insulin Syringe-Needle U-100) .... Use Daily As Instructed Icd-9 Code 250.00 7)  Multivitamins  Tabs (Multiple Vitamin) .[name].. 1 Daily By Mouth 8)  Fish Oil 1200 Mg Caps (Omega-3 Fatty Acids) .[name].. 1 Twice A Day By Mouth 9)  Vitamin D3 2000 Unit Caps (Cholecalciferol) .[name].. 1 Daily By Mouth 10)  K-99 595 Mg Caps (Potassium Gluconate) .... Take One Tablet By Mouth Once Daily. 11)  Mag-Ox 400 400 Mg Tabs (Magnesium Oxide) .... Take One Tablet By  Mouth Once Daily. 12)  Crestor 20 Mg Tabs (Rosuvastatin Calcium) .... Take 1 Tablet By Mouth Once A Day 13)  Furosemide 80 Mg Tabs (Furosemide) .... Take 1 Tablet By Mouth Every Morning 14)  Toprol Xl 50 Mg Xr24h-Tab (Metoprolol Succinate) .... Take 1 Tablet By Mouth Once A Day 15)  Warfarin Sodium 7.5 Mg Tabs (Warfarin Sodium) .... A (X5 Days) Directed 16)  Warfarin Sodium 5 Mg Tabs (Warfarin Sodium) .... Take  As Directed (X2  Days)  Allergies (verified): No Known Drug Allergies  Past History:  Past Medical History: Last updated: 09/02/2010 DIABETIC PERIPHERAL NEUROPATHY (ICD-250.60) RBBB  BUNDLE BRANCH BLOCK, RIGHT (ICD-426.4) GASTROESOPHAGEAL REFLUX DISEASE (ICD-530.81). MACULAR DEGENERATION, HX OF (ICD-V12.49) SLEEP APNEA (Q000111Q) SYSTOLIC MURMUR (99991111.2) CAROTID ARTERY DISEASE (ICD-433.10)..Doppler... 11/5 2010... 49% bilateral stenoses SPECIAL SCREENING MALIGNANT NEOPLASM OF PROSTATE (ICD-V76.44) COLONIC POLYPS, BENIGN, HX OF (ICD-V12.72) ANEMIA, SECONDARY TO ACUTE BLOOD LOSS (ICD-285.1) Hx of DIVERTICULOSIS, COLON, WITH HEMORRHAGE (ICD-562.12) CAD (ICD-414.00)....catheterization 2006 /  nuclear.. slight lateral ischemia... medical therapy  /   cath...08/04/2010...grafts patent from redo CABG...medical Rx and ablate Atrial flutter (LV not injected) EF   60%... echo... October, 2009 /  65-70%...echo.Marland KitchenMarland Kitchen9/19/2011 MR   mild...echo...07/2010 CABG 1995..  redo CABG 2006 GI bleeding... severe... 2009... multiple units of blood transfused... patient off aspirin Aspirin.... not taking because of prior GI bleeding SPECIAL SCREENING MALIG NEOPLASMS OTHER SITES (ICD-V76.49) ANEMIA (ICD-285.9) ALLERGY, ENVIRONMENTAL (ICD-995.3) COLONIC POLYPS, BENIGN (ICD-211.3) ERECTILE DYSFUNCTION, MILD (ICD-302.72) HYPERTENSION (ICD-401.9) HYPERLIPIDEMIA (ICD-272.4) .[name] low HDL DIVERTICULOSIS, COLON (ICD-562.10) DIABETES MELLITUS, TYPE II (ICD-250.00) Edema Muscle aches from crestor PNA and NSTEMI 9/11 at [facility] with repeat cath, rec medical mgmt Atrial Flutter   hospital 07/2010 with PNA and cath done.Marland KitchenMarland KitchenCoumadin..Atrial flutter ablation to be done  Past Surgical History: Last updated: 05/12/2009 POLYPECTOMY, HX OF (ICD-V15.9) CATARACT EXTRACTION, HX OF (ICD-V45.61) VASECTOMY, HX OF (ICD-V26.52) TONSILLECTOMY, HX OF (ICD-V45.79) CAROTID ENDARTERECTOMY, HX OF (ICD-V15.1) CORONARY ARTERY BYPASS GRAFT, HX OF  (ICD-V45.81) CHOLECYSTECTOMY, HX OF (ICD-V45.79)  Family History: Last updated: 08/09/2010 Father: dec 65, MI at 60 Mother: dec 73 Kidney Failure CVA, DM Sister A 29 Arthritis Throat Ca Sister A 19 MI DM No FH of Colon Cancer:  Social History: Last updated: 08/09/2010 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 Married  Widower 12/2005 2 Children    Lives alone Former Smoker, none in 16 years Alcohol use-no Drug use-no From [employer], 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 he is significantly obese HEENT Normal.  Neck veins were flat, carotids were brisk.  Lungs were clear.  Heart sounds were regular without murmurs or gallops.  Abdomen was soft with active bowel sounds. There is no clubbing cyanosis or edema. Skin Warm and dry    Impression & Recommendations:  Problem # 1:  ATRIAL FIBRILLATION (ICD-427.31) he has maintained sinus rhythm  since cardioversion. I have suggested that we undertake a observation phase for the frequency of atrial fibrillation. In the event that he reverts, I would undertake cardioversion. If these are in the range of 6-12 months apart, I would probably not add any tear of the drug. If it becomes more frequent than that I would consider the addition of either Tikosyn or amiodarone as his blood work and renal function allow. His updated medication list for this problem  includes:    Toprol Xl 50 Mg Xr24h-tab (Metoprolol succinate) .[name]... Take 1 tablet by mouth once a day    Warfarin Sodium 7.5 Mg Tabs (Warfarin sodium) .[name]... A (x5 days) directed    Warfarin Sodium 5 Mg Tabs (Warfarin sodium) .[name]... Take  as directed (x2 days)  Problem # 2:  DIABETES MELLITUS, TYPE II (ICD-250.00) we will have him resume his Actos which we have stopped apparently in the hospital. He will be following up with his primary care physician to come up with a long-term plan His updated medication list for this problem includes:    Lantus 100 Unit/ml Soln (Insulin glargine) ..... Inject 35 units p.m. as directed    Diovan 320 Mg Tabs (Valsartan) .[name]... 1 tablet po daily

## 2010-12-16 NOTE — Letter (Signed)
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

## 2010-12-16 NOTE — Assessment & Plan Note (Signed)
Summary: 4 MIN TO FILL OUT DMV PAPERWORK/[facility]   Vital Signs:  Patient profile:   77 year old male Height:      69 inches Weight:      210.75 pounds BMI:     31.23 Temp:     98.2 degrees F oral Pulse rate:   72 / minute Pulse rhythm:   regular BP sitting:   140 / 56  (left arm) Cuff size:   large  Vitals Entered By: Christena Deem CMA Deborra Medina) (November 22, 2010 10:43 AM) CC: 30 minute OV - DMV paperwork   History of Present Illness: See scanned forms for DMV.  No complaints.    H/o CAD, feeling well w/o CP, short of breath, and minimal bilateral lower extremity edema  H/o DM2, controlled w/o hypoglycemia.    Vision exam per ophtho clinic.    H/o OSA, well controlled. No fatigue.  Complian wiht CPAP.   Allergies: No Known Drug Allergies  Review of Systems       See HPI.  Otherwise negative.    Physical Exam  General:  no apparent distress normocephalic atraumatic eomi, perrl  mucous membranes moist + able to hear a whisper with hearing aids regular rate and rhythm clear to auscultation bilaterally minimal bilateral lower extremity edema ext well perfused.    Impression & Recommendations:  Problem # 1:  Sports physical (other med exam for admin purpose) (ICD-V70.3) >25 min spent with patient, at least half of which was spent face to face with patient. See scanned forms.  Eye exam per ophtho.  As long as he eye exam meets criteria- and I will defer to ophtho for this- I see no reason to prevent driving as detailed.    Complete Medication List: 1)  Lantus 100 Unit/ml Soln (Insulin glargine) .... Inject 20 units p.m. as directed, add 1 unit if am suger >140, decrease 1 unit if am sugar <100 2)  Viagra 100 Mg Tabs (Sildenafil citrate) .[name].. 1 tablet 1 hour pior 3)  Norvasc 10 Mg Tabs (Amlodipine besylate) .... Take 1/2 tablet  daily by mouth 4)  Diovan 320 Mg Tabs (Valsartan) .[name].. 1 tablet po daily 5)  Iron 325 (65 Fe) Mg Tabs (Ferrous sulfate) .... 2 tablets by mouth  once daily 6)  Sm Insulin Syringe 31g X 5/16" 0.3 Ml Misc (Insulin syringe-needle u-100) .... Use daily as instructed icd-9 code 250.00 7)  Multivitamins Tabs (Multiple vitamin) .[name].. 1 daily by mouth 8)  Fish Oil 1200 Mg Caps (Omega-3 fatty acids) .[name].. 1 twice a day by mouth 9)  Vitamin D3 2000 Unit Caps (Cholecalciferol) .[name].. 1 daily by mouth 10)  K-99 595 Mg Caps (Potassium gluconate) .... Take one tablet by mouth once daily. 11)  Mag-ox 400 400 Mg Tabs (Magnesium oxide) .... Take one tablet by mouth once daily. 12)  Crestor 20 Mg Tabs (Rosuvastatin calcium) .... Take 1 tablet by mouth once a day 13)  Furosemide 80 Mg Tabs (Furosemide) .... Take 1 tablet by mouth every morning 14)  Toprol Xl 50 Mg Xr24h-tab (Metoprolol succinate) .... Take 1 tablet by mouth once a day 15)  Warfarin Sodium 7.5 Mg Tabs (Warfarin sodium) .... A (x5 days) directed 16)  Warfarin Sodium 5 Mg Tabs (Warfarin sodium) .... Take  as directed (x2 days)  Patient Instructions: 1)  I would get a physical in 4/12.  Take care.  Let me know if you have concerns in the meantime.    Orders Added: 1)  Est. Patient Level IV [id number]    Current Allergies (reviewed today): No known allergies

## 2010-12-16 NOTE — Letter (Signed)
Summary: Medical Report Form/NCDMV  Medical Report Form/NCDMV   Imported By: Edmonia James 11/29/2010 15:11:20  _____________________________________________________________________  External Attachment:    Type:   Image     Comment:   External Document

## 2010-12-16 NOTE — Miscellaneous (Signed)
  Clinical Lists Changes  Observations: Added new observation of PAST MED HX: DIABETIC PERIPHERAL NEUROPATHY (ICD-250.60) RBBB  BUNDLE BRANCH BLOCK, RIGHT (ICD-426.4) GASTROESOPHAGEAL REFLUX DISEASE (ICD-530.81) MACULAR DEGENERATION, HX OF (ICD-V12.49) SLEEP APNEA (Q000111Q) SYSTOLIC MURMUR (99991111.2) CAROTID ARTERY DISEASE (ICD-433.10)..Doppler... 11/5 2010... 49% bilateral stenoses SPECIAL SCREENING MALIGNANT NEOPLASM OF PROSTATE (ICD-V76.44) COLONIC POLYPS, BENIGN, HX OF (ICD-V12.72) ANEMIA, SECONDARY TO ACUTE BLOOD LOSS (ICD-285.1) Hx of DIVERTICULOSIS, COLON, WITH HEMORRHAGE (ICD-562.12) CAD (ICD-414.00)....catheterization 2006 /  nuclear.. slight lateral ischemia... medical therapy  /   cath...08/04/2010...grafts patent from redo CABG...medical Rx and ablate Atrial flutter (LV not injected) EF   60%... echo... October, 2009 /  65-70%...echo.Marland KitchenMarland Kitchen9/19/2011 MR   mild...echo...07/2010 CABG 1995..  redo CABG 2006 GI bleeding... severe... 2009... multiple units of blood transfused... patient off aspirin Aspirin.... not taking because of prior GI bleeding SPECIAL SCREENING MALIG NEOPLASMS OTHER SITES (ICD-V76.49) ANEMIA (ICD-285.9) ALLERGY, ENVIRONMENTAL (ICD-995.3) COLONIC POLYPS, BENIGN (ICD-211.3) ERECTILE DYSFUNCTION, MILD (ICD-302.72) HYPERTENSION (ICD-401.9) HYPERLIPIDEMIA (ICD-272.4) .[name] low HDL DIVERTICULOSIS, COLON (ICD-562.10) DIABETES MELLITUS, TYPE II (ICD-250.00) Edema Muscle aches from crestor PNA and NSTEMI 9/11 at [facility] with repeat cath, rec medical mgmt Atrial Flutter   hospital 07/2010 with PNA and cath done.Marland KitchenMarland KitchenCoumadin..Atrial flutter ablation to be done (09/01/2010 8:52) Added new observation of PRIMARY MD: Raenette Rover MD (09/01/2010 8:52)       Past History:  Past Medical History: DIABETIC PERIPHERAL NEUROPATHY (ICD-250.60) RBBB  BUNDLE BRANCH BLOCK, RIGHT (ICD-426.4) GASTROESOPHAGEAL REFLUX DISEASE (ICD-530.81) MACULAR DEGENERATION, HX OF  (ICD-V12.49) SLEEP APNEA (Q000111Q) SYSTOLIC MURMUR (99991111.2) CAROTID ARTERY DISEASE (ICD-433.10)..Doppler... 11/5 2010... 49% bilateral stenoses SPECIAL SCREENING MALIGNANT NEOPLASM OF PROSTATE (ICD-V76.44) COLONIC POLYPS, BENIGN, HX OF (ICD-V12.72) ANEMIA, SECONDARY TO ACUTE BLOOD LOSS (ICD-285.1) Hx of DIVERTICULOSIS, COLON, WITH HEMORRHAGE (ICD-562.12) CAD (ICD-414.00)....catheterization 2006 /  nuclear.. slight lateral ischemia... medical therapy  /   cath...08/04/2010...grafts patent from redo CABG...medical Rx and ablate Atrial flutter (LV not injected) EF   60%... echo... October, 2009 /  65-70%...echo.Marland KitchenMarland Kitchen9/19/2011 MR   mild...echo...07/2010 CABG 1995..  redo CABG 2006 GI bleeding... severe... 2009... multiple units of blood transfused... patient off aspirin Aspirin.... not taking because of prior GI bleeding SPECIAL SCREENING MALIG NEOPLASMS OTHER SITES (ICD-V76.49) ANEMIA (ICD-285.9) ALLERGY, ENVIRONMENTAL (ICD-995.3) COLONIC POLYPS, BENIGN (ICD-211.3) ERECTILE DYSFUNCTION, MILD (ICD-302.72) HYPERTENSION (ICD-401.9) HYPERLIPIDEMIA (ICD-272.4) .[name] low HDL DIVERTICULOSIS, COLON (ICD-562.10) DIABETES MELLITUS, TYPE II (ICD-250.00) Edema Muscle aches from crestor PNA and NSTEMI 9/11 at [facility] with repeat cath, rec medical mgmt Atrial Flutter   hospital 07/2010 with PNA and cath done.Marland KitchenMarland KitchenCoumadin..Atrial flutter ablation to be done

## 2010-12-16 NOTE — Medication Information (Signed)
Summary: Coumadin Clinic  Anticoagulant Therapy  Managed by: Inactive Referring MD: Dola Argyle, MD PCP: Raenette Rover MD Supervising MD: Johnsie Cancel MD, Collier Salina Indication 1: Atrial fibrillation Lab Used: [facility] Site: [place] INR RANGE 2.0-3.0          Comments: INR's followed by Dr [name]  Allergies: No Known Drug Allergies  Anticoagulation Management History:      Positive risk factors for bleeding include an age of 16 years or older, history of GI bleeding, and presence of serious comorbidities.  The bleeding index is 'high risk'.  Positive CHADS2 values include History of HTN, Age > 74 years old, and History of Diabetes.  His last INR was 2.2.  Anticoagulation responsible provider: Johnsie Cancel MD, Collier Salina.    Anticoagulation Management Assessment/Plan:      The patient's current anticoagulation dose is Warfarin sodium 7.5 mg tabs: a (x5 days) directed, Warfarin sodium 5 mg tabs: take  as directed (x2 days).  The next INR is due 4 weeks.  Anticoagulation instructions were given to patient.  Results were reviewed/authorized by Inactive.         Prior Anticoagulation Instructions: INR 2.4  Continue taking Coumadin as directed by anticoagulation clinic.

## 2010-12-16 NOTE — Miscellaneous (Signed)
  Clinical Lists Changes  Problems: Added new problem of ATRIAL FLUTTER (ICD-427.32) Observations: Added new observation of PAST MED HX: DIABETIC PERIPHERAL NEUROPATHY (ICD-250.60) RBBB  BUNDLE BRANCH BLOCK, RIGHT (ICD-426.4) GASTROESOPHAGEAL REFLUX DISEASE (ICD-530.81) MACULAR DEGENERATION, HX OF (ICD-V12.49) SLEEP APNEA (Q000111Q) SYSTOLIC MURMUR (99991111.2) CAROTID ARTERY DISEASE (ICD-433.10)..Doppler... 11/5 2010... 49% bilateral stenoses SPECIAL SCREENING MALIGNANT NEOPLASM OF PROSTATE (ICD-V76.44) COLONIC POLYPS, BENIGN, HX OF (ICD-V12.72) ANEMIA, SECONDARY TO ACUTE BLOOD LOSS (ICD-285.1) Hx of DIVERTICULOSIS, COLON, WITH HEMORRHAGE (ICD-562.12) CAD (ICD-414.00)....catheterization 2006 /  nuclear.. slight lateral ischemia... medical therapy  /   cath...08/04/2010...grafts patent from redo CABG...medical Rx and ablate Atrial flutter EF   60%... echo... October, 2009 and a CABG 1995..  redo CABG 2006 GI bleeding... severe... 2009... multiple units of blood transfused... patient off aspirin Aspirin.... not taking because of prior GI bleeding SPECIAL SCREENING MALIG NEOPLASMS OTHER SITES (ICD-V76.49) ANEMIA (ICD-285.9) ALLERGY, ENVIRONMENTAL (ICD-995.3) COLONIC POLYPS, BENIGN (ICD-211.3) ERECTILE DYSFUNCTION, MILD (ICD-302.72) HYPERTENSION (ICD-401.9) HYPERLIPIDEMIA (ICD-272.4) .[name] low HDL DIVERTICULOSIS, COLON (ICD-562.10) DIABETES MELLITUS, TYPE II (ICD-250.00) Edema Muscle aches from crestor PNA and NSTEMI 9/11 at [facility] with repeat cath, rec medical mgmt Atrial Flutter   hospital 07/2010 with PNA and cath done.Marland KitchenMarland KitchenCoumadin..Atrial flutter ablation to be done (08/24/2010 10:53) Added new observation of PRIMARY MD: Raenette Rover MD (08/24/2010 10:53)       Past History:  Past Medical History: DIABETIC PERIPHERAL NEUROPATHY (ICD-250.60) RBBB  BUNDLE BRANCH BLOCK, RIGHT (ICD-426.4) GASTROESOPHAGEAL REFLUX DISEASE (ICD-530.81) MACULAR DEGENERATION, HX OF  (ICD-V12.49) SLEEP APNEA (Q000111Q) SYSTOLIC MURMUR (99991111.2) CAROTID ARTERY DISEASE (ICD-433.10)..Doppler... 11/5 2010... 49% bilateral stenoses SPECIAL SCREENING MALIGNANT NEOPLASM OF PROSTATE (ICD-V76.44) COLONIC POLYPS, BENIGN, HX OF (ICD-V12.72) ANEMIA, SECONDARY TO ACUTE BLOOD LOSS (ICD-285.1) Hx of DIVERTICULOSIS, COLON, WITH HEMORRHAGE (ICD-562.12) CAD (ICD-414.00)....catheterization 2006 /  nuclear.. slight lateral ischemia... medical therapy  /   cath...08/04/2010...grafts patent from redo CABG...medical Rx and ablate Atrial flutter EF   60%... echo... October, 2009 and a CABG 1995..  redo CABG 2006 GI bleeding... severe... 2009... multiple units of blood transfused... patient off aspirin Aspirin.... not taking because of prior GI bleeding SPECIAL SCREENING MALIG NEOPLASMS OTHER SITES (ICD-V76.49) ANEMIA (ICD-285.9) ALLERGY, ENVIRONMENTAL (ICD-995.3) COLONIC POLYPS, BENIGN (ICD-211.3) ERECTILE DYSFUNCTION, MILD (ICD-302.72) HYPERTENSION (ICD-401.9) HYPERLIPIDEMIA (ICD-272.4) .[name] low HDL DIVERTICULOSIS, COLON (ICD-562.10) DIABETES MELLITUS, TYPE II (ICD-250.00) Edema Muscle aches from crestor PNA and NSTEMI 9/11 at [facility] with repeat cath, rec medical mgmt Atrial Flutter   hospital 07/2010 with PNA and cath done.Marland KitchenMarland KitchenCoumadin..Atrial flutter ablation to be done

## 2010-12-16 NOTE — Miscellaneous (Signed)
  Clinical Lists Changes  Observations: Added new observation of PAST MED HX: DIABETIC PERIPHERAL NEUROPATHY (ICD-250.60) RBBB  BUNDLE BRANCH BLOCK, RIGHT (ICD-426.4) GASTROESOPHAGEAL REFLUX DISEASE (ICD-530.81). MACULAR DEGENERATION, HX OF (ICD-V12.49) SLEEP APNEA (Q000111Q) SYSTOLIC MURMUR (99991111.2) CAROTID ARTERY DISEASE (ICD-433.10)..Doppler... 11/5 2010... 49% bilateral stenoses SPECIAL SCREENING MALIGNANT NEOPLASM OF PROSTATE (ICD-V76.44) COLONIC POLYPS, BENIGN, HX OF (ICD-V12.72) ANEMIA, SECONDARY TO ACUTE BLOOD LOSS (ICD-285.1) Hx of DIVERTICULOSIS, COLON, WITH HEMORRHAGE (ICD-562.12) CAD (ICD-414.00)....catheterization 2006 /  nuclear.. slight lateral ischemia... medical therapy  /   cath...08/04/2010...grafts patent from redo CABG...medical Rx and ablate Atrial flutter (LV not injected) EF   60%... echo... October, 2009 /  65-70%...echo.Marland KitchenMarland Kitchen9/19/2011 MR   mild...echo...07/2010 CABG 1995..  redo CABG 2006 GI bleeding... severe... 2009... multiple units of blood transfused... patient off aspirin Aspirin.... not taking because of prior GI bleeding SPECIAL SCREENING MALIG NEOPLASMS OTHER SITES (ICD-V76.49) ANEMIA (ICD-285.9) ALLERGY, ENVIRONMENTAL (ICD-995.3) COLONIC POLYPS, BENIGN (ICD-211.3) ERECTILE DYSFUNCTION, MILD (ICD-302.72) HYPERTENSION (ICD-401.9) HYPERLIPIDEMIA (ICD-272.4) .[name] low HDL DIVERTICULOSIS, COLON (ICD-562.10) DIABETES MELLITUS, TYPE II (ICD-250.00) Edema Muscle aches from crestor PNA and NSTEMI 9/11 at [facility] with repeat cath, rec medical mgmt Atrial Flutter   hospital 07/2010 with PNA and cath done.Marland KitchenMarland KitchenCoumadin. /   .Atrial flutter ablation planned, but patient then had atrial fibrillation,  /    outpatient cardioversion September 08, 2010..NSR... plan to follow.. Dr. Caryl Comes (10/26/2010 18:24) Added new observation of PRIMARY MD: Raenette Rover MD (10/26/2010 18:24)       Past History:  Past Medical History: DIABETIC PERIPHERAL NEUROPATHY  (ICD-250.60) RBBB  BUNDLE BRANCH BLOCK, RIGHT (ICD-426.4) GASTROESOPHAGEAL REFLUX DISEASE (ICD-530.81). MACULAR DEGENERATION, HX OF (ICD-V12.49) SLEEP APNEA (Q000111Q) SYSTOLIC MURMUR (99991111.2) CAROTID ARTERY DISEASE (ICD-433.10)..Doppler... 11/5 2010... 49% bilateral stenoses SPECIAL SCREENING MALIGNANT NEOPLASM OF PROSTATE (ICD-V76.44) COLONIC POLYPS, BENIGN, HX OF (ICD-V12.72) ANEMIA, SECONDARY TO ACUTE BLOOD LOSS (ICD-285.1) Hx of DIVERTICULOSIS, COLON, WITH HEMORRHAGE (ICD-562.12) CAD (ICD-414.00)....catheterization 2006 /  nuclear.. slight lateral ischemia... medical therapy  /   cath...08/04/2010...grafts patent from redo CABG...medical Rx and ablate Atrial flutter (LV not injected) EF   60%... echo... October, 2009 /  65-70%...echo.Marland KitchenMarland Kitchen9/19/2011 MR   mild...echo...07/2010 CABG 1995..  redo CABG 2006 GI bleeding... severe... 2009... multiple units of blood transfused... patient off aspirin Aspirin.... not taking because of prior GI bleeding SPECIAL SCREENING MALIG NEOPLASMS OTHER SITES (ICD-V76.49) ANEMIA (ICD-285.9) ALLERGY, ENVIRONMENTAL (ICD-995.3) COLONIC POLYPS, BENIGN (ICD-211.3) ERECTILE DYSFUNCTION, MILD (ICD-302.72) HYPERTENSION (ICD-401.9) HYPERLIPIDEMIA (ICD-272.4) .[name] low HDL DIVERTICULOSIS, COLON (ICD-562.10) DIABETES MELLITUS, TYPE II (ICD-250.00) Edema Muscle aches from crestor PNA and NSTEMI 9/11 at [facility] with repeat cath, rec medical mgmt Atrial Flutter   hospital 07/2010 with PNA and cath done.Marland KitchenMarland KitchenCoumadin. /   .Atrial flutter ablation planned, but patient then had atrial fibrillation,  /    outpatient cardioversion September 08, 2010..NSR... plan to follow.. Dr. Caryl Comes

## 2010-12-16 NOTE — Assessment & Plan Note (Signed)
Summary: per check out/sf      Allergies Added: NKDA  Visit Type:  3 mo f/u Primary Provider:  Raenette Rover MD  CC:  CAD.  History of Present Illness: The patient is seen for followup of coronary disease.  I saw him last October 05, 2009.  This visit was to followup an episode of chest discomfort.  His nuclear scan had raised the question of mild lateral ischemia.  He is seen back now for followup to be sure that he is stable.  He has not been having any significant chest pain.  Problems Prior to Update: 1)  Muscle Cramps, Foot  (ICD-729.82) 2)  Edema  (ICD-782.3) 3)  Polypectomy, Hx of  (ICD-V15.9) 4)  Cataract Extraction, Hx of  (ICD-V45.61) 5)  Vasectomy, Hx of  (ICD-V26.52) 6)  Tonsillectomy, Hx of  (ICD-V45.79) 7)  Diabetic Peripheral Neuropathy  (ICD-250.60) 8)  Anemia, Hx of  (ICD-V12.3) 9)  Carotid Endarterectomy, Hx of  (ICD-V15.1) 10)  Coronary Artery Bypass Graft, Hx of  (ICD-V45.81) 11)  Bundle Branch Block, Right  (ICD-426.4) 12)  Gastroesophageal Reflux Disease  (ICD-530.81) 13)  Cholecystectomy, Hx of  (ICD-V45.79) 14)  Macular Degeneration, Hx of  (ICD-V12.49) 15)  Sleep Apnea  (Q000111Q) 16)  Systolic Murmur  ([id number]) 17)  Carotid Artery Disease  (ICD-433.10) 18)  Special Screening Malignant Neoplasm of Prostate  (ICD-V76.44) 19)  Colonic Polyps, Benign, Hx of  (ICD-V12.72) 20)  Anemia, Secondary To Acute Blood Loss  (ICD-285.1) 21)  Hx of Diverticulosis, Colon, With Hemorrhage  (ICD-562.12) 22)  Cad  (ICD-414.00) 23)  Special Screening Malig Neoplasms Other Sites  (ICD-V76.49) 24)  Anemia  (ICD-285.9) 25)  Allergy, Environmental  (ICD-995.3) 26)  Colonic Polyps, Benign  (ICD-211.3) 27)  Erectile Dysfunction, Mild  (ICD-302.72) 28)  Hypertension  (ICD-401.9) 29)  Hyperlipidemia  (ICD-272.4) 30)  Diverticulosis, Colon  (ICD-562.10) 31)  Diabetes Mellitus, Type II  (ICD-250.00)  Current Medications (verified): 1)  Crestor 10 Mg Tabs  (Rosuvastatin Calcium) .[name].. 1 Tablet By Mouth At Bedtime 2)  Lantus 100 Unit/ml Soln (Insulin Glargine) .... Inject 35 Units Subcutaneously As Directed 3)  Viagra 100 Mg  Tabs (Sildenafil Citrate) .[name].. 1 Tablet 1 Hour Pior 4)  Norvasc 10 Mg  Tabs (Amlodipine Besylate) .[name].. 1 Daily By Mouth Once Daily 5)  Furosemide 40 Mg  Tabs (Furosemide) .[name].. 1 Tablet On Sat. and Sun. 1/2 Tablet Other Days 6)  Diovan 320 Mg Tabs (Valsartan) .[name].. 1 Tablet Po Daily 7)  Iron 325 (65 Fe) Mg Tabs (Ferrous Sulfate) .... 2 Tablets By Mouth Once Daily 8)  Sm Insulin Syringe 31g X 5/16" 0.3 Ml Misc (Insulin Syringe-Needle U-100) .... Use Daily As Instructed Icd-9 Code 250.00 9)  Toprol Xl 25 Mg Xr24h-Tab (Metoprolol Succinate) .[name].. 1 Daily By Once Daily 10)  Multivitamins  Tabs (Multiple Vitamin) .[name].. 1 Daily By Mouth 11)  Fish Oil 1200 Mg Caps (Omega-3 Fatty Acids) .[name].. 1 Twice A Day By Mouth 12)  Vitamin D3 2000 Unit Caps (Cholecalciferol) .[name].. 1 Daily By Mouth 13)  K-99 595 Mg Caps (Potassium Gluconate) .... Take One Tablet By Mouth Once Daily. 14)  Mag-Ox 400 400 Mg Tabs (Magnesium Oxide) .... Take One Tablet By Mouth Once Daily. 15)  Actos 30 Mg Tabs (Pioglitazone Hcl) .... Take 1/2 By Mouth Two Times A Day  Allergies (verified): No Known Drug Allergies  Past History:  Past Medical History: DIABETIC PERIPHERAL NEUROPATHY (ICD-250.60) ANEMIA, HX OF (ICD-V12.3) RBBB  BUNDLE BRANCH BLOCK,  RIGHT (ICD-426.4) GASTROESOPHAGEAL REFLUX DISEASE (ICD-530.81) MACULAR DEGENERATION, HX OF (ICD-V12.49) SLEEP APNEA (Q000111Q) SYSTOLIC MURMUR (99991111.2) CAROTID ARTERY DISEASE (ICD-433.10)..Doppler... 11/5 2010... 49% bilateral stenoses SPECIAL SCREENING MALIGNANT NEOPLASM OF PROSTATE (ICD-V76.44) COLONIC POLYPS, BENIGN, HX OF (ICD-V12.72) ANEMIA, SECONDARY TO ACUTE BLOOD LOSS (ICD-285.1) Hx of DIVERTICULOSIS, COLON, WITH HEMORRHAGE (ICD-562.12) CAD (ICD-414.00)....catheterization 2006 /  nuclear.. slight lateral  ischemia... medical therapy EF   60%... echo... October, 2009 and a CABG 1995..  redo CABG 2006 GI bleeding... severe... 2009... multiple units of blood transfused... patient off aspirin Aspirin.... not taking because of prior GI bleeding SPECIAL SCREENING MALIG NEOPLASMS OTHER SITES (ICD-V76.49) ANEMIA (ICD-285.9) ALLERGY, ENVIRONMENTAL (ICD-995.3) COLONIC POLYPS, BENIGN (ICD-211.3) ERECTILE DYSFUNCTION, MILD (ICD-302.72) HYPERTENSION (ICD-401.9) HYPERLIPIDEMIA (ICD-272.4) .[name] low HDL DIVERTICULOSIS, COLON (ICD-562.10) DIABETES MELLITUS, TYPE II (ICD-250.00) Skin problems  allergies Edema Muscle aches from crestor    Review of Systems       Patient denies fever, chills, headache, sweats, rash, change in vision, change in hearing, cough, nausea vomiting, urinary symptoms, chest pain.  He does have some shortness of breath with exertion.  All other systems are reviewed and are negative.  Vital Signs:  Patient profile:   77 year old male Height:      69 inches Weight:      215 pounds BMI:     31.86 Pulse rate:   64 / minute Pulse rhythm:   regular BP sitting:   170 / 60  (left arm) Cuff size:   large  Vitals Entered By: Julaine Hua, CMA (December 16, 2009 11:11 AM)  Physical Exam  General:  generally stable. Eyes:  no xanthelasma. Neck:  soft right carotid bruit. Lungs:  lungs are clear.  Respiratory effort is nonlabored. Heart:  cardiac exam reveals S1-S2.  There is a soft systolic murmur. Abdomen:  abdomen is soft. Extremities:  no peripheral edema. Psych:  patient is oriented to person time and place.  Affect is normal.   Impression & Recommendations:  Problem # 1:  EDEMA (ICD-782.3) Volume status is stable.  No change in therapy.  Problem # 2:  CAROTID ENDARTERECTOMY, HX OF (ICD-V15.1) carotid artery disease is carefully followed.  No change in therapy or further workup at this time.  Problem # 3:  CAD (ICD-414.00)  His updated medication list for this  problem includes:    Norvasc 10 Mg Tabs (Amlodipine besylate) .[name]... 1 daily by mouth once daily    Toprol Xl 25 Mg Xr24h-tab (Metoprolol succinate) .[name]... 1 daily by once daily Coronary disease is stable.  No further chest pain.  No further workup.  Six-month followup.  Patient Instructions: 1)  Your physician recommends that you schedule a follow-up appointment in: 6 months.

## 2010-12-16 NOTE — Consult Note (Signed)
Summary: [facility]   Imported By: Sallee Provencal 08/31/2010 14:58:10  _____________________________________________________________________  External Attachment:    Type:   Image     Comment:   External Document

## 2010-12-16 NOTE — Assessment & Plan Note (Signed)
Summary: PER CHECK OUT/SF      Allergies Added: NKDA  Visit Type:  Follow-up Primary Provider:  Raenette Rover MD  CC:  atrial fib and fluid overload.  History of Present Illness: The patient is seen for cardiology followup.  When I saw him last he was close to having a flutter ablation.  However his rhythm changed atrial fib.  He was then seen and he underwent cardioversion.  He felt much better after this.  He saw Dr. Caryl Comes in followup and it was felt we should watch him clinically and make decisions based on any recurrences.  Tikosyn or amiodarone could be considered. the patient then had an episode of palpitations one evening.  This resolved but he has not felt as well since.  He may also have some slight shortness of breath.  Current Medications (verified): 1)  Lantus 100 Unit/ml Soln (Insulin Glargine) .... Inject 20 Units P.m. As Directed, Add 1 Unit If Am Suger >140, Decrease 1 Unit If Am Sugar <100 2)  Viagra 100 Mg  Tabs (Sildenafil Citrate) .[name].. 1 Tablet 1 Hour Pior 3)  Norvasc 10 Mg  Tabs (Amlodipine Besylate) .... Take 1/2 Tablet  Daily By Mouth 4)  Diovan 320 Mg Tabs (Valsartan) .[name].. 1 Tablet Po Daily 5)  Iron 325 (65 Fe) Mg Tabs (Ferrous Sulfate) .... 2 Tablets By Mouth Once Daily 6)  Sm Insulin Syringe 31g X 5/16" 0.3 Ml Misc (Insulin Syringe-Needle U-100) .... Use Daily As Instructed Icd-9 Code 250.00 7)  Multivitamins  Tabs (Multiple Vitamin) .[name].. 1 Daily By Mouth 8)  Fish Oil 1200 Mg Caps (Omega-3 Fatty Acids) .[name].. 1 Twice A Day By Mouth 9)  Vitamin D3 2000 Unit Caps (Cholecalciferol) .[name].. 1 Daily By Mouth 10)  K-99 595 Mg Caps (Potassium Gluconate) .... Take One Tablet By Mouth Once Daily. 11)  Mag-Ox 400 400 Mg Tabs (Magnesium Oxide) .... Take One Tablet By Mouth Once Daily. 12)  Crestor 20 Mg Tabs (Rosuvastatin Calcium) .... Take 1 Tablet By Mouth Once A Day 13)  Furosemide 80 Mg Tabs (Furosemide) .... Take 1 Tablet By Mouth Every Morning 14)  Toprol Xl 50 Mg  Xr24h-Tab (Metoprolol Succinate) .... Take 1 Tablet By Mouth Once A Day 15)  Warfarin Sodium 7.5 Mg Tabs (Warfarin Sodium) .... A (X5 Days) Directed 16)  Warfarin Sodium 5 Mg Tabs (Warfarin Sodium) .... Take  As Directed (X2 Days)  Allergies (verified): No Known Drug Allergies  Past History:  Past Medical History: DIABETIC PERIPHERAL NEUROPATHY (ICD-250.60) RBBB  BUNDLE BRANCH BLOCK, RIGHT (ICD-426.4) GASTROESOPHAGEAL REFLUX DISEASE (ICD-530.81). MACULAR DEGENERATION, HX OF (ICD-V12.49) SLEEP APNEA (ICD-780.57).. SYSTOLIC MURMUR (99991111.2) CAROTID ARTERY DISEASE (ICD-433.10)..Doppler... 11/5 2010... 49% bilateral stenoses SPECIAL SCREENING MALIGNANT NEOPLASM OF PROSTATE (ICD-V76.44) COLONIC POLYPS, BENIGN, HX OF (ICD-V12.72) ANEMIA, SECONDARY TO ACUTE BLOOD LOSS (ICD-285.1) Hx of DIVERTICULOSIS, COLON, WITH HEMORRHAGE (ICD-562.12) CAD (ICD-414.00)....catheterization 2006 /  nuclear.. slight lateral ischemia... medical therapy  /   cath...08/04/2010...grafts patent from redo CABG...medical Rx and ablate Atrial flutter (LV not injected) EF   60%... echo... October, 2009 /  65-70%...echo.Marland KitchenMarland Kitchen9/19/2011 MR   mild...echo...07/2010 CABG 1995..  redo CABG 2006 GI bleeding... severe... 2009... multiple units of blood transfused... patient off aspirin Aspirin.... not taking because of prior GI bleeding SPECIAL SCREENING MALIG NEOPLASMS OTHER SITES (ICD-V76.49) ANEMIA (ICD-285.9) ALLERGY, ENVIRONMENTAL (ICD-995.3) COLONIC POLYPS, BENIGN (ICD-211.3) ERECTILE DYSFUNCTION, MILD (ICD-302.72) HYPERTENSION (ICD-401.9) HYPERLIPIDEMIA (ICD-272.4) .[name] low HDL DIVERTICULOSIS, COLON (ICD-562.10) DIABETES MELLITUS, TYPE II (ICD-250.00) Edema Muscle aches from crestor PNA and  NSTEMI 9/11 at [facility] with repeat cath, rec medical mgmt Atrial Flutter   hospital 07/2010 with PNA and cath done.Marland KitchenMarland KitchenCoumadin. /   .Atrial flutter ablation planned, but patient then had atrial fibrillation,  /    outpatient cardioversion  September 08, 2010..NSR... plan to follow.. Dr. Caryl Comes  Review of Systems       Patient denies fever, chills, headache, sweats, rash, change in vision, change in hearing, chest pain, cough, nausea vomiting, urinary symptoms.  All other systems are reviewed and are negative  Vital Signs:  Patient profile:   77 year old male Weight:      210 pounds Pulse rate:   61 / minute BP sitting:   124 / 60  (left arm)  Vitals Entered By: Mignon Pine, RMA (October 28, 2010 11:19 AM)  Physical Exam  General:  patient is stable today. Head:  head is atraumatic. Eyes:  no xanthelasma. Neck:  no jugular venous distention.  There is a carotid bruit. Chest Wall:  no chest wall tenderness. Lungs:  lungs are clear.  Respiratory effort is nonlabored. Heart:  cardiac exam reveals S1-S2.  There is a soft systolic murmur. Abdomen:  abdomen is soft. Msk:  no musculoskeletal deformities. Extremities:  he does have 1+ peripheral edema. Skin:  no skin rashes. Psych:  patient is oriented to person place affect is normal.   Impression & Recommendations:  Problem # 1:  ATRIAL FIBRILLATION (ICD-427.31)  His updated medication list for this problem includes:    Toprol Xl 50 Mg Xr24h-tab (Metoprolol succinate) .[name]... Take 1 tablet by mouth once a day    Warfarin Sodium 7.5 Mg Tabs (Warfarin sodium) .[name]... A (x5 days) directed    Warfarin Sodium 5 Mg Tabs (Warfarin sodium) .[name]... Take  as directed (x2 days)  Orders: EKG w/ Interpretation (93000) EKG is done today and reviewed by me.  He does have right bundle branch block that is old.  The rhythm is sinus with PACs.  I have reviewed the cardioversion note and the office visit notes by Dr. Caryl Comes.  I discussed with the patient the issue of whether we should start antiarrhythmics now or not.  I feel that it is most appropriate to follow his rhythm further before we change any medicines.  He is in agreement.  Problem # 2:  ENCOUNTER FOR LONG-TERM USE OF  ANTICOAGULANTS (ICD-V58.61) Patient is tolerating Coumadin well and doing well.  No change in therapy.  Problem # 3:  EDEMA (ICD-782.3) There is some evidence of volume overload today.  I will have him take an extra dose of Lasix today and tomorrow.  I have reminded him to be careful with salt intake and he drinks fluids only in moderation with no excess water.  I will then see him back for followup to see him as doing.  Problem # 4:  CAROTID ENDARTERECTOMY, HX OF (ICD-V15.1) The patient has known carotid disease and his carotids are being followed carefully with Dopplers  Patient Instructions: 1)  Take an extra 80mg  of Lasix today and tomorrow 2)  Follow up in 3-4 weeks

## 2010-12-16 NOTE — Assessment & Plan Note (Signed)
Summary: ec6  Medications Added LANTUS 100 UNIT/ML SOLN (INSULIN GLARGINE) Inject 35 units p.m. as directed WARFARIN SODIUM 7.5 MG TABS (WARFARIN SODIUM) a (x5 days) directed WARFARIN SODIUM 5 MG TABS (WARFARIN SODIUM) take  as directed (x2 days)      Allergies Added: NKDA  Visit Type:  ec6 Primary [name]:  Raenette Rover MD  CC:  fatigued, CP, and .  History of Present Illness: neural is seen in followup for atrial fibrillation previously with atrial flutter and coronary artery disease.   [name]  Hospitalized in September, 2011 with a pneumonia.  There was slight enzyme evaluation.  Cardiac catheterization was done in his grafts were patent from his redo CABG.  Medical therapy was recommended.  We saw him in hospital and decided to coumadinize him and bring him back for ablation. He was seen by Dr. [name] last week and was found to be in atrial fibrillation. His INRs have been therapeutic.  He notes that his exercise tolerance has been considerably less; only new medication is the warfarin; his Toprol dose has been mildly increased.    Problems Prior to Update: 1)  Atrial Fibrillation  (ICD-427.31) 2)  Atrial Flutter  (ICD-427.32) 3)  Encounter For Therapeutic Drug Monitoring  (ICD-V58.83) 4)  Encounter For Long-term Use of Anticoagulants  (ICD-V58.61) 5)  Atrial Fibrillation  (ICD-427.31) 6)  Muscle Cramps, Foot  (ICD-729.82) 7)  Edema  (ICD-782.3) 8)  Polypectomy, Hx of  (ICD-V15.9) 9)  Cataract Extraction, Hx of  (ICD-V45.61) 10)  Vasectomy, Hx of  (ICD-V26.52) 11)  Tonsillectomy, Hx of  (ICD-V45.79) 12)  Diabetic Peripheral Neuropathy  (ICD-250.60) 13)  Anemia, Hx of  (ICD-V12.3) 14)  Carotid Endarterectomy, Hx of  (ICD-V15.1) 15)  Coronary Artery Bypass Graft, Hx of  (ICD-V45.81) 16)  Bundle Branch Block, Right  (ICD-426.4) 17)  Gastroesophageal Reflux Disease  (ICD-530.81) 18)  Cholecystectomy, Hx of  (ICD-V45.79) 19)  Macular Degeneration, Hx of  (ICD-V12.49) 20)   Sleep Apnea  (Q000111Q) 21)  Systolic Murmur  ([id number]) 22)  Carotid Artery Disease  (ICD-433.10) 23)  Special Screening Malignant Neoplasm of Prostate  (ICD-V76.44) 24)  Colonic Polyps, Benign, Hx of  (ICD-V12.72) 25)  Anemia, Secondary To Acute Blood Loss  (ICD-285.1) 26)  Hx of Diverticulosis, Colon, With Hemorrhage  (ICD-562.12) 27)  Cad  (ICD-414.00) 28)  Special Screening Malig Neoplasms Other Sites  (ICD-V76.49) 29)  Anemia  (ICD-285.9) 30)  Allergy, Environmental  (ICD-995.3) 31)  Colonic Polyps, Benign  (ICD-211.3) 32)  Erectile Dysfunction, Mild  (ICD-302.72) 33)  Hypertension  (ICD-401.9) 34)  Hyperlipidemia  (ICD-272.4) 35)  Diverticulosis, Colon  (ICD-562.10) 36)  Diabetes Mellitus, Type II  (ICD-250.00)  Current Medications (verified): 1)  Lantus 100 Unit/ml Soln (Insulin Glargine) .... Inject 35 Units P.m. As Directed 2)  Viagra 100 Mg  Tabs (Sildenafil Citrate) .[name].. 1 Tablet 1 Hour Pior 3)  Norvasc 10 Mg  Tabs (Amlodipine Besylate) .... Take 1/2 Tablet  Daily By Mouth 4)  Diovan 320 Mg Tabs (Valsartan) .[name].. 1 Tablet Po Daily 5)  Iron 325 (65 Fe) Mg Tabs (Ferrous Sulfate) .... 2 Tablets By Mouth Once Daily 6)  Sm Insulin Syringe 31g X 5/16" 0.3 Ml Misc (Insulin Syringe-Needle U-100) .... Use Daily As Instructed Icd-9 Code 250.00 7)  Multivitamins  Tabs (Multiple Vitamin) .[name].. 1 Daily By Mouth 8)  Fish Oil 1200 Mg Caps (Omega-3 Fatty Acids) .[name].. 1 Twice A Day By Mouth 9)  Vitamin D3 2000 Unit Caps (Cholecalciferol) .[name].. 1 Daily By  Mouth 10)  K-99 595 Mg Caps (Potassium Gluconate) .... Take One Tablet By Mouth Once Daily. 11)  Mag-Ox 400 400 Mg Tabs (Magnesium Oxide) .... Take One Tablet By Mouth Once Daily. 12)  Crestor 20 Mg Tabs (Rosuvastatin Calcium) .... Take 1 Tablet By Mouth Once A Day 13)  Furosemide 80 Mg Tabs (Furosemide) .... Take 1 Tablet By Mouth Every Morning 14)  Toprol Xl 50 Mg Xr24h-Tab (Metoprolol Succinate) .... Take 1 Tablet By Mouth Once A  Day 15)  Warfarin Sodium 7.5 Mg Tabs (Warfarin Sodium) .... A (X5 Days) Directed 16)  Warfarin Sodium 5 Mg Tabs (Warfarin Sodium) .... Take  As Directed (X2 Days)  Allergies (verified): No Known Drug Allergies  Past History:  Past Medical History: Last updated: 09/02/2010 DIABETIC PERIPHERAL NEUROPATHY (ICD-250.60) RBBB  BUNDLE BRANCH BLOCK, RIGHT (ICD-426.4) GASTROESOPHAGEAL REFLUX DISEASE (ICD-530.81). MACULAR DEGENERATION, HX OF (ICD-V12.49) SLEEP APNEA (Q000111Q) SYSTOLIC MURMUR (99991111.2) CAROTID ARTERY DISEASE (ICD-433.10)..Doppler... 11/5 2010... 49% bilateral stenoses SPECIAL SCREENING MALIGNANT NEOPLASM OF PROSTATE (ICD-V76.44) COLONIC POLYPS, BENIGN, HX OF (ICD-V12.72) ANEMIA, SECONDARY TO ACUTE BLOOD LOSS (ICD-285.1) Hx of DIVERTICULOSIS, COLON, WITH HEMORRHAGE (ICD-562.12) CAD (ICD-414.00)....catheterization 2006 /  nuclear.. slight lateral ischemia... medical therapy  /   cath...08/04/2010...grafts patent from redo CABG...medical Rx and ablate Atrial flutter (LV not injected) EF   60%... echo... October, 2009 /  65-70%...echo.Marland KitchenMarland Kitchen9/19/2011 MR   mild...echo...07/2010 CABG 1995..  redo CABG 2006 GI bleeding... severe... 2009... multiple units of blood transfused... patient off aspirin Aspirin.... not taking because of prior GI bleeding SPECIAL SCREENING MALIG NEOPLASMS OTHER SITES (ICD-V76.49) ANEMIA (ICD-285.9) ALLERGY, ENVIRONMENTAL (ICD-995.3) COLONIC POLYPS, BENIGN (ICD-211.3) ERECTILE DYSFUNCTION, MILD (ICD-302.72) HYPERTENSION (ICD-401.9) HYPERLIPIDEMIA (ICD-272.4) .[name] low HDL DIVERTICULOSIS, COLON (ICD-562.10) DIABETES MELLITUS, TYPE II (ICD-250.00) Edema Muscle aches from crestor PNA and NSTEMI 9/11 at [facility] with repeat cath, rec medical mgmt Atrial Flutter   hospital 07/2010 with PNA and cath done.Marland KitchenMarland KitchenCoumadin..Atrial flutter ablation to be done  Past Surgical History: Last updated: 05/12/2009 POLYPECTOMY, HX OF (ICD-V15.9) CATARACT EXTRACTION, HX OF  (ICD-V45.61) VASECTOMY, HX OF (ICD-V26.52) TONSILLECTOMY, HX OF (ICD-V45.79) CAROTID ENDARTERECTOMY, HX OF (ICD-V15.1) CORONARY ARTERY BYPASS GRAFT, HX OF (ICD-V45.81) CHOLECYSTECTOMY, HX OF (ICD-V45.79)  Family History: Last updated: 08/09/2010 Father: dec 65, MI at 70 Mother: dec 73 Kidney Failure CVA, DM Sister A 70 Arthritis Throat Ca Sister A 28 MI DM No FH of Colon Cancer:  Social History: Last updated: 08/09/2010 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 Married  Widower 12/2005 2 Children    Lives alone Former Smoker, none in 14 years Alcohol use-no Drug use-no From [employer], 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 Lungs were clear. Heart sounds were irregular without murmurs or gallops. Abdomen was soft with active bowel sounds. There is no clubbing cyanosis or edema.    Impression & Recommendations:  Problem # 1:  ATRIAL FIBRILLATION (ICD-427.31)  the patient has atrial fibrillation now as well as atrial flutter. With his thromboembolic risk factors, long-term anticoagulation would be appropriate. We will plan to undertake DC cardioversion in hopes of finding restore sinus rhythm and improve his exercise tolerance. We'll then make decisions down the road as to whether antiarrhythmic therapy would be appropriate to maintain sinus rhythm given the potential for poor arrhythmia.   he understands the limitations of catheter ablation for flutter and is in atrial fibrillation as well as a appreciate a ablation it for ablation while possible is not the  nexta ppropriate step  His INRs have been therapeutic. His updated medication list for this problem includes:    Toprol Xl 50 Mg Xr24h-tab (Metoprolol succinate) .[name]... Take 1 tablet by mouth once a day    Warfarin Sodium 7.5 Mg Tabs (Warfarin sodium) .[name]... A (x5 days) directed    Warfarin Sodium 5 Mg Tabs (Warfarin sodium) .[name]... Take  as directed (x2 days)  Orders: Cardioversion (Cardioversion)  Patient Instructions: 1)  Your physician recommends that you return for lab work tomorrow- CBC,PT,PTT, BMP - 427.31 2)  Your physician recommends that you continue on your current medications as directed. Please refer to the Current Medication list given to you today. 3)  Your physician has recommended that you have a cardioversion (DCCV).  Electrical cardioversion uses a jolt of electricity to your heart either through paddles or wired patches attached to your chest. This is a controlled, usually prescheduled, procedure. Defibrillation is done under light anesthesia in the hospital, and you usually go home the day of the procedure. This is done to get your heart back into a normal rhythm. You are not awake for the procedure. Please see the instruction sheet given to you today.

## 2010-12-16 NOTE — Assessment & Plan Note (Signed)
Summary: CPX/DLO   Vital Signs:  Patient profile:   77 year old male Weight:      211.75 pounds Temp:     98.4 degrees F oral Pulse rate:   68 / minute Pulse rhythm:   regular BP sitting:   154 / 60  (left arm) Cuff size:   large  Vitals Entered By: Emelia Salisbury LPN (March 14, 624THL 075-GRM PM) CC: 30 Minute checkup, had a colonoscopy 10/09 by Dr. Carlean Purl   History of Present Illness: Pt here for followup. He has lost 8 pounds since last being seen. He has mild chest congestion that appears to be clearing. His Sys no. higher than previously. .   Preventive Screening-Counseling & Management  Alcohol-Tobacco     Alcohol drinks/day: 0     Smoking Status: quit     Year Quit: 1958     Pack years: 10     Passive Smoke Exposure: no  Caffeine-Diet-Exercise     Caffeine use/day: 2 teas a week     Does Patient Exercise: yes     Type of exercise: walks 30-40 mins     Times/week: 7  Problems Prior to Update: 1)  Muscle Cramps, Foot  (ICD-729.82) 2)  Edema  (ICD-782.3) 3)  Polypectomy, Hx of  (ICD-V15.9) 4)  Cataract Extraction, Hx of  (ICD-V45.61) 5)  Vasectomy, Hx of  (ICD-V26.52) 6)  Tonsillectomy, Hx of  (ICD-V45.79) 7)  Diabetic Peripheral Neuropathy  (ICD-250.60) 8)  Anemia, Hx of  (ICD-V12.3) 9)  Carotid Endarterectomy, Hx of  (ICD-V15.1) 10)  Coronary Artery Bypass Graft, Hx of  (ICD-V45.81) 11)  Bundle Branch Block, Right  (ICD-426.4) 12)  Gastroesophageal Reflux Disease  (ICD-530.81) 13)  Cholecystectomy, Hx of  (ICD-V45.79) 14)  Macular Degeneration, Hx of  (ICD-V12.49) 15)  Sleep Apnea  (Q000111Q) 16)  Systolic Murmur  ([id number]) 17)  Carotid Artery Disease  (ICD-433.10) 18)  Special Screening Malignant Neoplasm of Prostate  (ICD-V76.44) 19)  Colonic Polyps, Benign, Hx of  (ICD-V12.72) 20)  Anemia, Secondary To Acute Blood Loss  (ICD-285.1) 21)  Hx of Diverticulosis, Colon, With Hemorrhage  (ICD-562.12) 22)  Cad  (ICD-414.00) 23)  Special Screening Malig  Neoplasms Other Sites  (ICD-V76.49) 24)  Anemia  (ICD-285.9) 25)  Allergy, Environmental  (ICD-995.3) 26)  Colonic Polyps, Benign  (ICD-211.3) 27)  Erectile Dysfunction, Mild  (ICD-302.72) 28)  Hypertension  (ICD-401.9) 29)  Hyperlipidemia  (ICD-272.4) 30)  Diverticulosis, Colon  (ICD-562.10) 31)  Diabetes Mellitus, Type II  (ICD-250.00)  Medications Prior to Update: 1)  Crestor 10 Mg Tabs (Rosuvastatin Calcium) .[name].. 1 Tablet By Mouth At Bedtime 2)  Lantus 100 Unit/ml Soln (Insulin Glargine) .... Inject 35 Units Subcutaneously As Directed 3)  Viagra 100 Mg  Tabs (Sildenafil Citrate) .[name].. 1 Tablet 1 Hour Pior 4)  Norvasc 10 Mg  Tabs (Amlodipine Besylate) .[name].. 1 Daily By Mouth Once Daily 5)  Furosemide 40 Mg  Tabs (Furosemide) .[name].. 1 Tablet On Sat. and Sun. 1/2 Tablet Other Days 6)  Diovan 320 Mg Tabs (Valsartan) .[name].. 1 Tablet Po Daily 7)  Iron 325 (65 Fe) Mg Tabs (Ferrous Sulfate) .... 2 Tablets By Mouth Once Daily 8)  Sm Insulin Syringe 31g X 5/16" 0.3 Ml Misc (Insulin Syringe-Needle U-100) .... Use Daily As Instructed Icd-9 Code 250.00 9)  Toprol Xl 25 Mg Xr24h-Tab (Metoprolol Succinate) .[name].. 1 Daily By Once Daily 10)  Multivitamins  Tabs (Multiple Vitamin) .[name].. 1 Daily By Mouth 11)  Fish Oil 1200 Mg Caps (Omega-3  Fatty Acids) .[name].. 1 Twice A Day By Mouth 12)  Vitamin D3 2000 Unit Caps (Cholecalciferol) .[name].. 1 Daily By Mouth 13)  K-99 595 Mg Caps (Potassium Gluconate) .... Take One Tablet By Mouth Once Daily. 14)  Mag-Ox 400 400 Mg Tabs (Magnesium Oxide) .... Take One Tablet By Mouth Once Daily. 15)  Actos 30 Mg Tabs (Pioglitazone Hcl) .... Take 1/2 By Mouth Two Times A Day  Allergies: No Known Drug Allergies  Past History:  Past Medical History: Last updated: 12/16/2009 DIABETIC PERIPHERAL NEUROPATHY (ICD-250.60) ANEMIA, HX OF (ICD-V12.3) RBBB  BUNDLE BRANCH BLOCK, RIGHT (ICD-426.4) GASTROESOPHAGEAL REFLUX DISEASE (ICD-530.81) MACULAR DEGENERATION, HX OF (ICD-V12.49) SLEEP  APNEA (Q000111Q) SYSTOLIC MURMUR (99991111.2) CAROTID ARTERY DISEASE (ICD-433.10)..Doppler... 11/5 2010... 49% bilateral stenoses SPECIAL SCREENING MALIGNANT NEOPLASM OF PROSTATE (ICD-V76.44) COLONIC POLYPS, BENIGN, HX OF (ICD-V12.72) ANEMIA, SECONDARY TO ACUTE BLOOD LOSS (ICD-285.1) Hx of DIVERTICULOSIS, COLON, WITH HEMORRHAGE (ICD-562.12) CAD (ICD-414.00)....catheterization 2006 /  nuclear.. slight lateral ischemia... medical therapy EF   60%... echo... October, 2009 and a CABG 1995..  redo CABG 2006 GI bleeding... severe... 2009... multiple units of blood transfused... patient off aspirin Aspirin.... not taking because of prior GI bleeding SPECIAL SCREENING MALIG NEOPLASMS OTHER SITES (ICD-V76.49) ANEMIA (ICD-285.9) ALLERGY, ENVIRONMENTAL (ICD-995.3) COLONIC POLYPS, BENIGN (ICD-211.3) ERECTILE DYSFUNCTION, MILD (ICD-302.72) HYPERTENSION (ICD-401.9) HYPERLIPIDEMIA (ICD-272.4) .[name] low HDL DIVERTICULOSIS, COLON (ICD-562.10) DIABETES MELLITUS, TYPE II (ICD-250.00) Skin problems  allergies Edema Muscle aches from crestor    Past Surgical History: Last updated: 05/12/2009 POLYPECTOMY, HX OF (ICD-V15.9) CATARACT EXTRACTION, HX OF (ICD-V45.61) VASECTOMY, HX OF (ICD-V26.52) TONSILLECTOMY, HX OF (ICD-V45.79) CAROTID ENDARTERECTOMY, HX OF (ICD-V15.1) CORONARY ARTERY BYPASS GRAFT, HX OF (ICD-V45.81) CHOLECYSTECTOMY, HX OF (ICD-V45.79)  Family History: Last updated: 01/25/2010 Father: dec 65 MI Mother: dec 73 Kidney Failure CVA  Sister A 66 Arthritis Throat Ca  Sister A 21 MI DM No FH of Colon Cancer:  Social History: Last updated: 01/25/2010 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 Married  Widower 12/2005 2 Children    Lives alone Former Smoker Alcohol use-no Drug use-no  Risk Factors: Alcohol Use: 0 (01/25/2010) Caffeine Use: 2 teas a week (01/25/2010) Exercise: yes (01/25/2010)  Risk Factors: Smoking Status: quit (01/25/2010) Passive Smoke Exposure: no  (01/25/2010)  Family History: Father: dec 65 MI Mother: dec 73 Kidney Failure CVA  Sister A 43 Arthritis Throat Ca  Sister A 50 MI DM No FH of Colon Cancer:  Social History: Occupation: Cytogeneticist Retired Married  Widower 12/2005 2 Children    Lives alone Former Smoker Alcohol use-no Drug use-no Caffeine use/day:  2 teas a week  Review of Systems General:  Denies chills, fatigue, fever, sweats, weakness, and weight loss; hands perspire, chroniucally. Eyes:  Denies blurring, discharge, and eye pain. ENT:  Complains of decreased hearing; denies earache and ringing in ears; wears H/As.. CV:  Complains of shortness of breath with exertion and swelling of feet; denies chest pain or discomfort, fainting, fatigue, and palpitations; easily. Resp:  Denies cough, shortness of breath, and wheezing. GI:  Denies abdominal pain, bloody stools, change in bowel habits, constipation, dark tarry stools, diarrhea, indigestion, loss of appetite, nausea, vomiting, vomiting blood, and yellowish skin color. GU:  Denies discharge, dysuria, nocturia, and urinary frequency. MS:  Denies joint pain, low back pain, muscle aches, cramps, and stiffness. Derm:  Denies dryness, insect bite(s), and rash. Neuro:  Denies numbness, poor balance, tingling, and tremors.  Physical Exam  General:  Well-developed,well-nourished,in no acute distress; alert,appropriate and cooperative throughout examination, obese but  better. Head:  Normocephalic and atraumatic without obvious abnormalities. No apparent alopecia or balding. Sinuses NT. Eyes:  Anicteric, Conjunctiva clear bilaterally.  Ears:  External ear exam shows no significant lesions or deformities.  Otoscopic examination reveals clear canals, tympanic membranes are intact bilaterally without bulging, retraction, inflammation or discharge. Hearing is grossly normal bilaterally. Nose:  External nasal examination shows no deformity or inflammation. Nasal  mucosa are pink and moist without lesions or exudates. Mouth:  Oral mucosa and oropharynx without lesions or exudates.  Teeth in good repair. Neck:  No deformities, masses, or tenderness noted. Chest Wall:  No deformities, masses, tenderness or gynecomastia noted. Breasts:  No masses or gynecomastia noted Lungs:  Clear throughout to auscultation. Heart:  Regular rate and rhythm; no murmurs, rubs,  or bruits. Abdomen:  obese, soft, non-tender BS +Panniculous. Rectal:  No external abnormalities noted. Normal sphincter tone. No rectal masses or tenderness. Gneg. Genitalia:  Testes bilaterally descended without nodularity, tenderness or masses. No scrotal masses or lesions. No penis lesions or urethral discharge. Prostate:  Prostate gland firm and smooth, no enlargement, nodularity, tenderness, mass, asymmetry or induration. 20-30gms. Msk:  No deformity or scoliosis noted of thoracic or lumbar spine.   Pulses:  R and L carotid,radial,femoral,dorsalis pedis and posterior tibial pulses are full and equal bilaterally Extremities:  No clubbing, cyanosis, edema, or deformity noted with normal full range of motion of all joints except limited ROM of shoulders bilat. Neurologic:  No cranial nerve deficits noted. Station and gait are normal. Plantar reflexes are down-going bilaterally. DTRs are symmetrical throughout. Sensory, motor and coordinative functions appear intact. Skin:  Intact without suspicious lesions or rashes, Aks ans Sks with benign moles throughout trunk Cervical Nodes:  No lymphadenopathy noted Inguinal Nodes:  No significant adenopathy Psych:  Cognition and judgment appear intact. Alert and cooperative with normal attention span and concentration. No apparent delusions, illusions, hallucinations  Diabetes Management Exam:    Foot Exam (with socks and/or shoes not present):       Sensory-Pinprick/Light touch:          Left medial foot (L-4): normal          Left dorsal foot (L-5):  normal          Left lateral foot (S-1): normal          Right medial foot (L-4): normal          Right dorsal foot (L-5): normal          Right lateral foot (S-1): normal       Sensory-Monofilament:          Left foot: normal          Right foot: normal       Inspection:          Left foot: normal          Right foot: normal       Nails:          Left foot: normal          Right foot: normal    Eye Exam:       Eye Exam done elsewhere          Date: 05/27/2009          Results: normal          Done by: Dr Sabra Heck   Impression & Recommendations:  Problem # 1:  MUSCLE CRAMPS, FOOT (ICD-729.82) Assessment Improved Discussed approach.   Problem # 2:  EDEMA (ICD-782.3) Assessment: Improved  Cont  curr meds. No edema today. His updated medication list for this problem includes:    Furosemide 40 Mg Tabs (Furosemide) .[name]... 1 tablet on sat. and sun. 1/2 tablet other days  Discussed elevation of the legs, use of compression stockings, sodium restiction, and medication use.   Problem # 3:  DIABETIC PERIPHERAL NEUROPATHY (ICD-250.60) Assessment: Unchanged  Sensation today nml. Discussed checking feet daily. His updated medication list for this problem includes:    Lantus 100 Unit/ml Soln (Insulin glargine) ..... Inject 35 units subcutaneously as directed    Diovan 320 Mg Tabs (Valsartan) .[name]... 1 tablet po daily    Actos 30 Mg Tabs (Pioglitazone hcl) .[name]... Take 1/2 by mouth two times a day  Labs Reviewed: Creat: 1.5 (01/04/2010)   Microalbumin: 20.4 (04/14/2006)  Last Eye Exam: normal (05/27/2009) Reviewed HgBA1c results: 6.1 (01/04/2010)  6.0 (10/29/2009)  Problem # 4:  GASTROESOPHAGEAL REFLUX DISEASE (ICD-530.81) Stable. His updated medication list for this problem includes:    Mag-ox 400 400 Mg Tabs (Magnesium oxide) .[name]... Take one tablet by mouth once daily.  Diagnostics Reviewed:  Discussed lifestyle modifications, diet, antacids/medications, and preventive measures.  Handout provided.   Problem # 5:  SYSTOLIC MURMUR (99991111.2) Assessment: Unchanged  Sounds unchanged and stable.  Problem # 6:  ANEMIA (N067566.9) Assessment: Unchanged  Mild anemia presumed from chronic disease. Do not think worth referring yet. Do not feel Aranesp necessary yet. His updated medication list for this problem includes:    Iron 325 (65 Fe) Mg Tabs (Ferrous sulfate) .[name]... 2 tablets by mouth once daily  Hgb: 11.8 (01/04/2010)   Hct: 34.6 (01/04/2010)   Platelets: 132.0 (01/04/2010) RBC: 3.68 (01/04/2010)   RDW: 14.3 (01/04/2010)   WBC: 5.5 (01/04/2010) MCV: 94.0 (01/04/2010)   MCHC: 34.0 (01/04/2010) Ferritin: 35.5 (01/07/2009) Iron: 39 (01/07/2009)   B12: 263 (09/13/2007)   Folate: > 20.0 ng/mL (09/13/2007)   TSH: 3.74 (01/07/2009)  Problem # 7:  ALLERGY, ENVIRONMENTAL (ICD-995.3) Assessment: Unchanged Per prior discussions.  Problem # 8:  HYPERTENSION (ICD-401.9) Assessment: Unchanged Systollically elevated. Continued weight loss will help immeasurably. Encouraged to continue efforts there. His updated medication list for this problem includes:    Norvasc 10 Mg Tabs (Amlodipine besylate) .[name]... 1 daily by mouth once daily    Furosemide 40 Mg Tabs (Furosemide) .[name]... 1 tablet on sat. and sun. 1/2 tablet other days    Diovan 320 Mg Tabs (Valsartan) .[name]... 1 tablet po daily    Toprol Xl 25 Mg Xr24h-tab (Metoprolol succinate) .[name]... 1 daily by once daily  BP today: 154/60 Prior BP: 170/60 (12/16/2009)  Labs Reviewed: K+: 4.2 (01/04/2010) Creat: : 1.5 (01/04/2010)   Chol: 148 (01/04/2010)   HDL: 45.70 (01/04/2010)   LDL: 86 (01/04/2010)   TG: 81.0 (01/04/2010)  Problem # 9:  HYPERLIPIDEMIA (ICD-272.4) Assessment: Unchanged Good nos. Cont Crestor. His updated medication list for this problem includes:    Crestor 10 Mg Tabs (Rosuvastatin calcium) .[name]... 1 tablet by mouth at bedtime  Labs Reviewed: SGOT: 22 (01/04/2010)   SGPT: 21 (01/04/2010)   HDL:45.70  (01/04/2010), 40.6 (01/07/2009)  LDL:86 (01/04/2010), 88 (01/07/2009)  Chol:148 (01/04/2010), 142 (01/07/2009)  Trig:81.0 (01/04/2010), 66 (01/07/2009)  Problem # 10:  DIABETES MELLITUS, TYPE II (ICD-250.00) Assessment: Unchanged Adequate and reasonable nos. Cont efforts. His updated medication list for this problem includes:    Lantus 100 Unit/ml Soln (Insulin glargine) ..... Inject 35 units subcutaneously as directed    Diovan 320 Mg Tabs (Valsartan) .Marland KitchenMarland KitchenMarland KitchenMarland Kitchen 1  tablet po daily    Actos 30 Mg Tabs (Pioglitazone hcl) .[name]... Take 1/2 by mouth two times a day  Labs Reviewed: Creat: 1.5 (01/04/2010)   Microalbumin: 20.4 (04/14/2006)  Last Eye Exam: normal (05/27/2009) Reviewed HgBA1c results: 6.1 (01/04/2010)  6.0 (10/29/2009)  Complete Medication List: 1)  Crestor 10 Mg Tabs (Rosuvastatin calcium) .[name].. 1 tablet by mouth at bedtime 2)  Lantus 100 Unit/ml Soln (Insulin glargine) .... Inject 35 units subcutaneously as directed 3)  Viagra 100 Mg Tabs (Sildenafil citrate) .[name].. 1 tablet 1 hour pior 4)  Norvasc 10 Mg Tabs (Amlodipine besylate) .[name].. 1 daily by mouth once daily 5)  Furosemide 40 Mg Tabs (Furosemide) .[name].. 1 tablet on sat. and sun. 1/2 tablet other days 6)  Diovan 320 Mg Tabs (Valsartan) .[name].. 1 tablet po daily 7)  Iron 325 (65 Fe) Mg Tabs (Ferrous sulfate) .... 2 tablets by mouth once daily 8)  Sm Insulin Syringe 31g X 5/16" 0.3 Ml Misc (Insulin syringe-needle u-100) .... Use daily as instructed icd-9 code 250.00 9)  Toprol Xl 25 Mg Xr24h-tab (Metoprolol succinate) .[name].. 1 daily by once daily 10)  Multivitamins Tabs (Multiple vitamin) .[name].. 1 daily by mouth 11)  Fish Oil 1200 Mg Caps (Omega-3 fatty acids) .[name].. 1 twice a day by mouth 12)  Vitamin D3 2000 Unit Caps (Cholecalciferol) .[name].. 1 daily by mouth 13)  K-99 595 Mg Caps (Potassium gluconate) .... Take one tablet by mouth once daily. 14)  Mag-ox 400 400 Mg Tabs (Magnesium oxide) .... Take one tablet by mouth once daily. 15)  Actos 30  Mg Tabs (Pioglitazone hcl) .... Take 1/2 by mouth two times a day  Patient Instructions: 1)  Take Guaifenesin by going to [facility] or [facility] and getting MUCOUS RELIEF EXPECTORANT (400mg ), take 11/2 tabs by mouth AM and NOON. 2)  Drink lots of fluids anytime taking Guaifenesin.  3)  Keep lozenge in mouth. 4)  RTC 6 mos. Lab prior.

## 2010-12-16 NOTE — Letter (Signed)
Summary: Anabel Halon letter  [place] at [facility]  [address] [place], [place] [address]   Phone: [phone]  Fax: [phone]       06/22/2010 MRN: [id number]  [place], [place]  [address]  Dear Mr. [place], and [place] announce the retirement of Modesto Charon, M.D., from full-time practice at the [facility] office effective May 13, 2010 and his plans of returning part-time.  It is important to Dr. Council Mechanic and to our practice that you understand that [facility] has seven physicians in our office for your health care needs.  We will continue to offer the same exceptional care that you have today.    Dr. Council Mechanic has spoken to many of you about his plans for retirement and returning part-time in the fall.   We will continue to work with you through the transition to schedule appointments for you in the office and meet the high standards that [facility] is committed to.   Again, it is with great pleasure that we share the news that Dr. Council Mechanic will return to [facility] at [facility] in October of 2011 with a reduced schedule.    If you have any questions, or would like to request an appointment with one of our physicians, please call us at [phone] and press the option for Scheduling an appointment.  We take pleasure in providing you with excellent patient care and look forward to seeing you at your next office visit.  [facility] are:  Viviana Simpler, M.D. Teresa Pelton, M.D. Loura Pardon, M.D. Eliezer Lofts, M.D. Owens Loffler, M.D. Arnette Norris, M.D. We proudly welcomed Renford Dills, M.D. and Ria Bush, M.D. to the practice in July/August 2011.  Sincerely,  [facility]

## 2010-12-21 ENCOUNTER — Telehealth: Payer: Self-pay | Admitting: Family Medicine

## 2010-12-22 ENCOUNTER — Encounter: Payer: Self-pay | Admitting: Family Medicine

## 2010-12-22 NOTE — Assessment & Plan Note (Signed)
Summary: [facility]    Visit Type:  treadmill visit Primary Provider:  Raenette Rover MD   History of Present Illness: The patient had a treadmill today to be sure that there was no evidence of chronotropic incompetence.  He actually did pretty well.  His heart rate went up to 117, representing 81% predicted maximum heart rate.  There is no chest pain.  He has underlying right bundle branch block.  He had scattered 2-3 beat runs of SVT at peak stress.  We are both encouraged by this.  He also had a carotid Doppler.  They showed no significant change.  I will see him for followup in 3 months.  Allergies: No Known Drug Allergies  Appended Document: pt instructions    Clinical Lists Changes  Observations: Added new observation of PI CARDIO: Your physician wants you to follow-up in:  3 months.  You will receive a reminder letter in the mail two months in advance. If you don't receive a letter, please call our office to schedule the follow-up appointment. (12/14/2010 15:14)       Patient Instructions: 1)  Your physician wants you to follow-up in:  3 months.  You will receive a reminder letter in the mail two months in advance. If you don't receive a letter, please call our office to schedule the follow-up appointment.  Appended Document: [facility]    Clinical Lists Changes  Medications: Removed medication of WARFARIN SODIUM 7.5 MG TABS (WARFARIN SODIUM) a (x5 days) directed Changed medication from WARFARIN SODIUM 5 MG TABS (WARFARIN SODIUM) take  as directed (x2 days) to WARFARIN SODIUM 5 MG TABS (WARFARIN SODIUM) take  as directed - Signed Rx of CRESTOR 20 MG TABS (ROSUVASTATIN CALCIUM) Take 1 tablet by mouth once a day;  #90 x 3;  Signed;  Entered by: Kevan Rosebush, RN;  Authorized by: Lorenza Evangelist, MD, [facility];  Method used: Faxed to [facility], [address] [address], [place]  [address], Ph: [phone], Fax: [phone] Rx of WARFARIN SODIUM 5 MG  TABS (WARFARIN SODIUM) take  as directed;  #150 x 0;  Signed;  Entered by: Kevan Rosebush, RN;  Authorized by: Lorenza Evangelist, MD, [facility];  Method used: Faxed to [facility], [address] [address], [place]  [address], Ph: [phone], Fax: [phone]    Prescriptions: WARFARIN SODIUM 5 MG TABS (WARFARIN SODIUM) take  as directed  #150 x 0   Entered by:   Kevan Rosebush, RN   Authorized by:   Lorenza Evangelist, MD, [facility]   Signed by:   Kevan Rosebush, RN on 12/14/2010   Method used:   Faxed to ...       [facility] [employer] [place]  [address]       Ph: [phone]       Fax: [phone]   RxID:   [id number] CRESTOR 20 MG TABS (ROSUVASTATIN CALCIUM) Take 1 tablet by mouth once a day  #90 x 3   Entered by:   Kevan Rosebush, RN   Authorized by:   Lorenza Evangelist, MD, [facility]   Signed by:   Kevan Rosebush, RN on 12/14/2010   Method used:   Faxed to ...       [facility] Probation officer)       [place]  [address]       Ph: [phone]  [phone]   [id number]:[id number]

## 2010-12-22 NOTE — Medication Information (Signed)
Summary: protime/tw  Anticoagulant Therapy  Managed by: Inactive Referring MD: Dola Argyle, MD PCP: Raenette Rover MD Supervising MD: Johnsie Cancel MD, Collier Salina Indication 1: Atrial fibrillation Lab Used: [facility] [place] Site: [place] INR RANGE 2.0-3.0           Allergies: No Known Drug Allergies  Anticoagulation Management History:      Positive risk factors for bleeding include an age of 75 years or older, history of GI bleeding, and presence of serious comorbidities.  The bleeding index is 'high risk'.  Positive CHADS2 values include History of HTN, Age > 36 years old, and History of Diabetes.  His last INR was 2.2 and today's INR is 2.0.  Anticoagulation responsible provider: Johnsie Cancel MD, Collier Salina.    Anticoagulation Management Assessment/Plan:      The patient's current anticoagulation dose is Warfarin sodium 5 mg tabs: take  as directed.  The next INR is due 4 weeks.  Anticoagulation instructions were given to patient.  Results were reviewed/authorized by Inactive.         Prior Anticoagulation Instructions: INR 2.4  Continue taking Coumadin as directed by anticoagulation clinic.    ANTICOAGULATION RECORD PREVIOUS REGIMEN & LAB RESULTS Anticoagulation Diagnosis:  Atrial fibrillation on  08/09/2010 Previous INR Goal Range:  2.0-3.0 on  08/09/2010 Previous INR:  2.2 on  11/16/2010 Previous Coumadin Dose(mg):  7.5mg  qd, T,SAT 5mg  on  09/24/2010 Previous Regimen:  7.5mg  qd, Tue take 5mg  on  09/10/2010 Previous Coagulation Comments:  . on  08/13/2010  NEW REGIMEN & LAB RESULTS Current INR: 2.0 Current Coumadin Dose(mg): 7.5mg  qd, Tue take 5mg   Regimen: 7.5mg  qd, Tue take 5mg    Provider: Damita Dunnings Repeat testing in: 4 weeks Dose has been reviewed with patient or caretaker during this visit. Reviewed by: Lavonna Rua  Anticoagulation Visit Questionnaire Coumadin dose missed/changed:  No Abnormal Bleeding Symptoms:  No  Any diet changes  including alcohol intake, vegetables or greens since the last visit:  No Any illnesses or hospitalizations since the last visit:  No Any signs of clotting since the last visit (including chest discomfort, dizziness, shortness of breath, arm tingling, slurred speech, swelling or redness in leg):  No  MEDICATIONS LANTUS 100 UNIT/ML SOLN (INSULIN GLARGINE) Inject 20 units p.m. as directed, add 1 unit if AM suger >140, decrease 1 unit if AM sugar <100 VIAGRA 100 MG  TABS (SILDENAFIL CITRATE) 1 TABLET 1 HOUR PIOR NORVASC 10 MG  TABS (AMLODIPINE BESYLATE) Take 1/2 tablet  daily by mouth DIOVAN 320 MG TABS (VALSARTAN) 1 tablet po daily IRON 325 (65 FE) MG TABS (FERROUS SULFATE) 2 tablets by mouth once daily SM INSULIN SYRINGE 31G X 5/16" 0.3 ML MISC (INSULIN SYRINGE-NEEDLE U-100) use daily as instructed icd-9 code 250.00 MULTIVITAMINS  TABS (MULTIPLE VITAMIN) 1 daily by mouth FISH OIL 1200 MG CAPS (OMEGA-3 FATTY ACIDS) 1 twice a day by mouth VITAMIN D3 2000 UNIT CAPS (CHOLECALCIFEROL) 1 daily by mouth K-99 595 MG CAPS (POTASSIUM GLUCONATE) Take one tablet by mouth once daily. MAG-OX 400 400 MG TABS (MAGNESIUM OXIDE) Take one tablet by mouth once daily. CRESTOR 20 MG TABS (ROSUVASTATIN CALCIUM) Take 1 tablet by mouth once a day FUROSEMIDE 80 MG TABS (FUROSEMIDE) Take 1 tablet by mouth every morning TOPROL XL 50 MG XR24H-TAB (METOPROLOL SUCCINATE) Take 1 tablet by mouth once a day WARFARIN SODIUM 5 MG TABS (WARFARIN SODIUM) take  as directed

## 2010-12-27 ENCOUNTER — Telehealth: Payer: Self-pay | Admitting: Family Medicine

## 2010-12-30 NOTE — Miscellaneous (Signed)
   Please fax to pharmacy.   Clinical Lists Changes  Medications: Rx of WARFARIN SODIUM 5 MG TABS (WARFARIN SODIUM) take  as directed;  #150 x 1;  Signed;  Entered by: [name] MD;  Authorized by: [name] MD;  Method used: Printed then faxed to [facility], [address] [address], [place]  [address], Ph: [phone], Fax: [phone]    Prescriptions: WARFARIN SODIUM 5 MG TABS (WARFARIN SODIUM) take  as directed  #150 x 1   Entered and Authorized by:   [name] MD   Signed by:   [name] MD on 12/22/2010   Method used:   Printed then faxed to ...       [facility] Prob[employer] officer)       [place]  [address]       Ph: [phone]       Fax: [phone]   RxID:   [id number]  Faxed.  Lugene Fuquay CMA Deborra Medina)  December 22, 2010 3:54 PM

## 2010-12-30 NOTE — Progress Notes (Signed)
Summary: refill request for warfarin  Phone Note Refill Request Message from:  Fax from Pharmacy  Refills Requested: Medication #1:  WARFARIN SODIUM 5 MG TABS take  as directed. [facility] is on your desk.  They had sent this to Dr. [name], who said script needs to come from  you.  Initial call taken by: Marty Heck CMA, AAMA,  December 21, 2010 12:52 PM  Follow-up for Phone Call        I will address when I am in the office tomorrow. [name] MD  December 21, 2010 1:00 PM

## 2011-01-05 NOTE — Progress Notes (Signed)
Summary: clarification needed on warfarin script  Phone Note From Pharmacy   Caller: [facility] Summary of Call: Pharmacy has sent form for clarification on warfarin instructions.  Forms are on your desk. Initial call taken by: Marty Heck CMA, [place],  December 27, 2010 9:00 AM  Follow-up for Phone Call        signed. [name] MD  December 27, 2010 9:40 AM  Follow-up by: [name] MD,  December 27, 2010 9:40 AM  Additional Follow-up for Phone Call Additional follow up Details #1::        Faxed. Additional Follow-up by: Christena Deem CMA ([place]),  December 27, 2010 11:41 AM

## 2011-01-12 ENCOUNTER — Telehealth: Payer: Self-pay | Admitting: Family Medicine

## 2011-01-13 ENCOUNTER — Ambulatory Visit (INDEPENDENT_AMBULATORY_CARE_PROVIDER_SITE_OTHER): Payer: Medicare Other

## 2011-01-13 ENCOUNTER — Encounter: Payer: Self-pay | Admitting: Family Medicine

## 2011-01-13 DIAGNOSIS — Z5181 Encounter for therapeutic drug level monitoring: Secondary | ICD-10-CM

## 2011-01-13 DIAGNOSIS — Z7901 Long term (current) use of anticoagulants: Secondary | ICD-10-CM

## 2011-01-13 DIAGNOSIS — I4891 Unspecified atrial fibrillation: Secondary | ICD-10-CM

## 2011-01-13 LAB — CONVERTED CEMR LAB: Prothrombin Time: 25.2 s

## 2011-01-20 ENCOUNTER — Encounter: Payer: Self-pay | Admitting: Cardiology

## 2011-01-20 DIAGNOSIS — I4891 Unspecified atrial fibrillation: Secondary | ICD-10-CM

## 2011-01-20 DIAGNOSIS — I4892 Unspecified atrial flutter: Secondary | ICD-10-CM

## 2011-01-20 NOTE — Progress Notes (Signed)
Summary: form for diabetic supplies  Phone Note From Pharmacy   Caller: [place] Summary of Call: Form for diabetic supplies is on  your desk, I checked with the pt and he does get supplies from this company.            Marty Heck CMA, AAMA  January 12, 2011 3:08 PM   Follow-up for Phone Call        I will address when I am in the office.  [name] MD  January 12, 2011 6:02 PM.

## 2011-01-20 NOTE — Medication Information (Signed)
Summary: [email or web address].TSC  Anticoagulant Therapy  Managed by: Inactive Referring MD: Dola Argyle, MD PCP: Raenette Rover MD Supervising MD: Johnsie Cancel MD, Collier Salina Indication 1: Atrial fibrillation Lab Used: [facility] [place] Site: [place] [facility] 25.2 INR RANGE 2.0-3.0           Allergies: No Known Drug Allergies  Anticoagulation Management History:      Positive risk factors for bleeding include an age of 54 years or older, history of GI bleeding, and presence of serious comorbidities.  The bleeding index is 'high risk'.  Positive CHADS2 values include History of HTN, Age > 75 years old, and History of Diabetes.  His last INR was 2.0 and today's INR is 2.1.  Prothrombin time is 25.2.  Anticoagulation responsible provider: Johnsie Cancel MD, Collier Salina.    Anticoagulation Management Assessment/Plan:      The patient's current anticoagulation dose is Warfarin sodium 5 mg tabs: take  as directed.  The next INR is due 4 weeks.  Anticoagulation instructions were given to patient.  Results were reviewed/authorized by Inactive.         Prior Anticoagulation Instructions: INR 2.4  Continue taking Coumadin as directed by anticoagulation clinic.    ANTICOAGULATION RECORD PREVIOUS REGIMEN & LAB RESULTS Anticoagulation Diagnosis:  Atrial fibrillation on  08/09/2010 Previous INR Goal Range:  2.0-3.0 on  08/09/2010 Previous INR:  2.0 on  12/16/2010 Previous Coumadin Dose(mg):  7.5mg  qd, Tue take 5mg   on  12/16/2010 Previous Regimen:  7.5mg  qd, Tue take 5mg   on  12/16/2010 Previous Coagulation Comments:  . on  08/13/2010  NEW REGIMEN & LAB RESULTS Current INR: 2.1 Current Coumadin Dose(mg):  7.5mg  qd, 5mg  tues, sat Regimen:  7.5mg  qd, 5mg  tues, sat  Provider: Damita Dunnings Repeat testing in: 4 weeks Dose has been reviewed with patient or caretaker during this visit. Reviewed by: Lavonna Rua  Anticoagulation Visit Questionnaire Coumadin dose missed/changed:  No Abnormal  Bleeding Symptoms:  No  Any diet changes including alcohol intake, vegetables or greens since the last visit:  No Any illnesses or hospitalizations since the last visit:  No Any signs of clotting since the last visit (including chest discomfort, dizziness, shortness of breath, arm tingling, slurred speech, swelling or redness in leg):  No  MEDICATIONS LANTUS 100 UNIT/ML SOLN (INSULIN GLARGINE) Inject 20 units p.m. as directed, add 1 unit if AM suger >140, decrease 1 unit if AM sugar <100 VIAGRA 100 MG  TABS (SILDENAFIL CITRATE) 1 TABLET 1 HOUR PIOR NORVASC 10 MG  TABS (AMLODIPINE BESYLATE) Take 1/2 tablet  daily by mouth DIOVAN 320 MG TABS (VALSARTAN) 1 tablet po daily IRON 325 (65 FE) MG TABS (FERROUS SULFATE) 2 tablets by mouth once daily SM INSULIN SYRINGE 31G X 5/16" 0.3 ML MISC (INSULIN SYRINGE-NEEDLE U-100) use daily as instructed icd-9 code 250.00 MULTIVITAMINS  TABS (MULTIPLE VITAMIN) 1 daily by mouth FISH OIL 1200 MG CAPS (OMEGA-3 FATTY ACIDS) 1 twice a day by mouth VITAMIN D3 2000 UNIT CAPS (CHOLECALCIFEROL) 1 daily by mouth K-99 595 MG CAPS (POTASSIUM GLUCONATE) Take one tablet by mouth once daily. MAG-OX 400 400 MG TABS (MAGNESIUM OXIDE) Take one tablet by mouth once daily. CRESTOR 20 MG TABS (ROSUVASTATIN CALCIUM) Take 1 tablet by mouth once a day FUROSEMIDE 80 MG TABS (FUROSEMIDE) Take 1 tablet by mouth every morning TOPROL XL 50 MG XR24H-TAB (METOPROLOL SUCCINATE) Take 1 tablet by mouth once a day WARFARIN SODIUM 5 MG TABS (WARFARIN SODIUM) take  as directed    Laboratory  Results   Blood Tests   Date/Time Received: January 13, 2011 9:58 AM Date/Time Reported: January 13, 2011 9:58 AM  PT: 25.2 s   (Normal Range: 10.6-13.4)  INR: 2.1   (Normal Range: 0.88-1.12   [facility]rap INR: 2.0-3.5)

## 2011-01-25 NOTE — Medication Information (Signed)
Summary: Order for Glucose Testing Supplies  Order for Glucose Testing Supplies   Imported By: Laural Benes 01/19/2011 14:12:59  _____________________________________________________________________  External Attachment:    Type:   Image     Comment:   External Document

## 2011-01-27 LAB — CBC
HCT: 32.1 % — ABNORMAL LOW (ref 39.0–52.0)
HCT: 32.2 % — ABNORMAL LOW (ref 39.0–52.0)
HCT: 33.6 % — ABNORMAL LOW (ref 39.0–52.0)
Hemoglobin: 10.4 g/dL — ABNORMAL LOW (ref 13.0–17.0)
Hemoglobin: 10.5 g/dL — ABNORMAL LOW (ref 13.0–17.0)
Hemoglobin: 11.8 g/dL — ABNORMAL LOW (ref 13.0–17.0)
MCH: 30.8 pg (ref 26.0–34.0)
MCH: 31.2 pg (ref 26.0–34.0)
MCHC: 32.1 g/dL (ref 30.0–36.0)
MCHC: 32.3 g/dL (ref 30.0–36.0)
MCHC: 32.7 g/dL (ref 30.0–36.0)
MCHC: 32.9 g/dL (ref 30.0–36.0)
MCV: 95 fL (ref 78.0–100.0)
MCV: 95 fL (ref 78.0–100.0)
MCV: 95.3 fL (ref 78.0–100.0)
MCV: 95.4 fL (ref 78.0–100.0)
Platelets: 107 10*3/uL — ABNORMAL LOW (ref 150–400)
Platelets: 120 10*3/uL — ABNORMAL LOW (ref 150–400)
RBC: 3.27 MIL/uL — ABNORMAL LOW (ref 4.22–5.81)
RBC: 3.78 MIL/uL — ABNORMAL LOW (ref 4.22–5.81)
RDW: 13.9 % (ref 11.5–15.5)
RDW: 14.1 % (ref 11.5–15.5)
RDW: 14.2 % (ref 11.5–15.5)
RDW: 14.5 % (ref 11.5–15.5)
WBC: 6.1 10*3/uL (ref 4.0–10.5)
WBC: 8.5 10*3/uL (ref 4.0–10.5)

## 2011-01-27 LAB — CARDIAC PANEL(CRET KIN+CKTOT+MB+TROPI)
CK, MB: 2.6 ng/mL (ref 0.3–4.0)
Relative Index: 1.4 (ref 0.0–2.5)
Total CK: 182 U/L (ref 7–232)
Total CK: 188 U/L (ref 7–232)
Troponin I: 0.15 ng/mL — ABNORMAL HIGH (ref 0.00–0.06)
Troponin I: 0.2 ng/mL — ABNORMAL HIGH (ref 0.00–0.06)
Troponin I: 0.25 ng/mL — ABNORMAL HIGH (ref 0.00–0.06)
Troponin I: 0.47 ng/mL — ABNORMAL HIGH (ref 0.00–0.06)

## 2011-01-27 LAB — BRAIN NATRIURETIC PEPTIDE: Pro B Natriuretic peptide (BNP): 261 pg/mL — ABNORMAL HIGH (ref 0.0–100.0)

## 2011-01-27 LAB — BASIC METABOLIC PANEL
BUN: 20 mg/dL (ref 6–23)
BUN: 26 mg/dL — ABNORMAL HIGH (ref 6–23)
BUN: 29 mg/dL — ABNORMAL HIGH (ref 6–23)
CO2: 25 mEq/L (ref 19–32)
Calcium: 9 mg/dL (ref 8.4–10.5)
Chloride: 109 mEq/L (ref 96–112)
Chloride: 110 mEq/L (ref 96–112)
GFR calc non Af Amer: 39 mL/min — ABNORMAL LOW (ref >60)
Glucose, Bld: 115 mg/dL — ABNORMAL HIGH (ref 70–99)
Glucose, Bld: 115 mg/dL — ABNORMAL HIGH (ref 70–99)
Glucose, Bld: 125 mg/dL — ABNORMAL HIGH (ref 70–99)
Potassium: 3.4 mEq/L — ABNORMAL LOW (ref 3.5–5.1)
Potassium: 3.4 mEq/L — ABNORMAL LOW (ref 3.5–5.1)
Potassium: 3.9 mEq/L (ref 3.5–5.1)
Sodium: 140 mEq/L (ref 135–145)
Sodium: 141 mEq/L (ref 135–145)
Sodium: 142 mEq/L (ref 135–145)

## 2011-01-27 LAB — POCT I-STAT, CHEM 8
BUN: 25 mg/dL — ABNORMAL HIGH (ref 6–23)
Calcium, Ion: 1.16 mmol/L (ref 1.12–1.32)
Chloride: 108 mEq/L (ref 96–112)
Creatinine, Ser: 1.5 mg/dL (ref 0.4–1.5)
Glucose, Bld: 162 mg/dL — ABNORMAL HIGH (ref 70–99)
HCT: 39 % (ref 39.0–52.0)
Hemoglobin: 13.3 g/dL (ref 13.0–17.0)
Potassium: 3.8 mEq/L (ref 3.5–5.1)
Sodium: 141 mEq/L (ref 135–145)
TCO2: 24 mmol/L (ref 0–100)

## 2011-01-27 LAB — GLUCOSE, CAPILLARY
Glucose-Capillary: 105 mg/dL — ABNORMAL HIGH (ref 70–99)
Glucose-Capillary: 105 mg/dL — ABNORMAL HIGH (ref 70–99)
Glucose-Capillary: 112 mg/dL — ABNORMAL HIGH (ref 70–99)
Glucose-Capillary: 112 mg/dL — ABNORMAL HIGH (ref 70–99)
Glucose-Capillary: 123 mg/dL — ABNORMAL HIGH (ref 70–99)
Glucose-Capillary: 127 mg/dL — ABNORMAL HIGH (ref 70–99)
Glucose-Capillary: 129 mg/dL — ABNORMAL HIGH (ref 70–99)
Glucose-Capillary: 134 mg/dL — ABNORMAL HIGH (ref 70–99)
Glucose-Capillary: 142 mg/dL — ABNORMAL HIGH (ref 70–99)
Glucose-Capillary: 99 mg/dL (ref 70–99)

## 2011-01-27 LAB — COMPREHENSIVE METABOLIC PANEL
AST: 20 U/L (ref 0–37)
Alkaline Phosphatase: 53 U/L (ref 39–117)
BUN: 27 mg/dL — ABNORMAL HIGH (ref 6–23)
CO2: 26 mEq/L (ref 19–32)
Chloride: 107 mEq/L (ref 96–112)
Creatinine, Ser: 1.69 mg/dL — ABNORMAL HIGH (ref 0.4–1.5)
GFR calc Af Amer: 48 mL/min — ABNORMAL LOW (ref >60)
GFR calc non Af Amer: 40 mL/min — ABNORMAL LOW (ref >60)
Potassium: 3.9 mEq/L (ref 3.5–5.1)
Total Bilirubin: 1.3 mg/dL — ABNORMAL HIGH (ref 0.3–1.2)

## 2011-01-27 LAB — TSH: TSH: 1.885 u[IU]/mL (ref 0.350–4.500)

## 2011-01-27 LAB — TROPONIN I: Troponin I: 0.34 ng/mL — ABNORMAL HIGH (ref 0.00–0.06)

## 2011-01-27 LAB — HEPARIN LEVEL (UNFRACTIONATED): Heparin Unfractionated: 0.63 IU/mL (ref 0.30–0.70)

## 2011-01-27 LAB — LIPID PANEL
Cholesterol: 102 mg/dL (ref 0–200)
Total CHOL/HDL Ratio: 2.3 RATIO

## 2011-01-27 LAB — DIFFERENTIAL
Eosinophils Absolute: 0.1 10*3/uL (ref 0.0–0.7)
Lymphocytes Relative: 7 % — ABNORMAL LOW (ref 12–46)
Lymphs Abs: 0.7 10*3/uL (ref 0.7–4.0)
Monocytes Relative: 7 % (ref 3–12)
Neutrophils Relative %: 85 % — ABNORMAL HIGH (ref 43–77)

## 2011-01-27 LAB — MAGNESIUM: Magnesium: 2 mg/dL (ref 1.5–2.5)

## 2011-01-27 LAB — CK TOTAL AND CKMB (NOT AT ARMC): Relative Index: 1.4 (ref 0.0–2.5)

## 2011-01-27 LAB — POCT CARDIAC MARKERS

## 2011-01-27 LAB — PROTIME-INR
INR: 1.08 (ref 0.00–1.49)
Prothrombin Time: 13.9 seconds (ref 11.6–15.2)

## 2011-02-10 ENCOUNTER — Ambulatory Visit: Payer: Medicare Other

## 2011-02-14 ENCOUNTER — Ambulatory Visit (INDEPENDENT_AMBULATORY_CARE_PROVIDER_SITE_OTHER): Payer: Medicare Other | Admitting: Family Medicine

## 2011-02-14 DIAGNOSIS — I4891 Unspecified atrial fibrillation: Secondary | ICD-10-CM

## 2011-02-14 DIAGNOSIS — I4892 Unspecified atrial flutter: Secondary | ICD-10-CM

## 2011-02-14 LAB — POCT INR: INR: 2.6

## 2011-02-14 NOTE — Patient Instructions (Signed)
No change in dose 

## 2011-02-21 ENCOUNTER — Other Ambulatory Visit (INDEPENDENT_AMBULATORY_CARE_PROVIDER_SITE_OTHER): Payer: Medicare Other | Admitting: Family Medicine

## 2011-02-21 DIAGNOSIS — E119 Type 2 diabetes mellitus without complications: Secondary | ICD-10-CM

## 2011-02-21 DIAGNOSIS — I10 Essential (primary) hypertension: Secondary | ICD-10-CM

## 2011-02-21 DIAGNOSIS — I4891 Unspecified atrial fibrillation: Secondary | ICD-10-CM

## 2011-02-21 DIAGNOSIS — D649 Anemia, unspecified: Secondary | ICD-10-CM

## 2011-02-21 DIAGNOSIS — E7800 Pure hypercholesterolemia, unspecified: Secondary | ICD-10-CM

## 2011-02-21 LAB — COMPREHENSIVE METABOLIC PANEL
ALT: 19 U/L (ref 0–53)
AST: 22 U/L (ref 0–37)
Albumin: 3.6 g/dL (ref 3.5–5.2)
Alkaline Phosphatase: 72 U/L (ref 39–117)
BUN: 27 mg/dL — ABNORMAL HIGH (ref 6–23)
CO2: 31 mEq/L (ref 19–32)
Calcium: 9.3 mg/dL (ref 8.4–10.5)
Chloride: 101 mEq/L (ref 96–112)
Creatinine, Ser: 1.9 mg/dL — ABNORMAL HIGH (ref 0.4–1.5)
GFR: 37.17 mL/min — ABNORMAL LOW (ref >60.00)
Glucose, Bld: 135 mg/dL — ABNORMAL HIGH (ref 70–99)
Potassium: 4.3 mEq/L (ref 3.5–5.1)
Sodium: 140 mEq/L (ref 135–145)
Total Bilirubin: 0.7 mg/dL (ref 0.3–1.2)
Total Protein: 6.8 g/dL (ref 6.0–8.3)

## 2011-02-21 LAB — LIPID PANEL
Cholesterol: 127 mg/dL (ref 0–200)
HDL: 35.6 mg/dL — ABNORMAL LOW (ref >39.00)
LDL Cholesterol: 72 mg/dL (ref 0–99)
Total CHOL/HDL Ratio: 4
Triglycerides: 95 mg/dL (ref 0.0–149.0)
VLDL: 19 mg/dL (ref 0.0–40.0)

## 2011-02-21 LAB — HEMOGLOBIN A1C: Hgb A1c MFr Bld: 7.1 % — ABNORMAL HIGH (ref 4.6–6.5)

## 2011-02-21 LAB — TSH: TSH: 4.92 u[IU]/mL (ref 0.35–5.50)

## 2011-02-21 LAB — CBC WITH DIFFERENTIAL/PLATELET
Basophils Absolute: 0 10*3/uL (ref 0.0–0.1)
Hemoglobin: 11.5 g/dL — ABNORMAL LOW (ref 13.0–17.0)
Lymphocytes Relative: 21.6 % (ref 12.0–46.0)
Monocytes Relative: 9.4 % (ref 3.0–12.0)
Neutro Abs: 4.9 10*3/uL (ref 1.4–7.7)
RDW: 14.8 % — ABNORMAL HIGH (ref 11.5–14.6)

## 2011-02-21 LAB — MICROALBUMIN / CREATININE URINE RATIO
Microalb Creat Ratio: 1 mg/g (ref 0.0–30.0)
Microalb, Ur: 0.3 mg/dL (ref 0.0–1.9)

## 2011-02-22 ENCOUNTER — Encounter: Payer: Self-pay | Admitting: Family Medicine

## 2011-02-23 ENCOUNTER — Telehealth: Payer: Self-pay | Admitting: Radiology

## 2011-02-23 DIAGNOSIS — Z7901 Long term (current) use of anticoagulants: Secondary | ICD-10-CM

## 2011-02-23 DIAGNOSIS — I4891 Unspecified atrial fibrillation: Secondary | ICD-10-CM

## 2011-02-23 DIAGNOSIS — Z5181 Encounter for therapeutic drug level monitoring: Secondary | ICD-10-CM

## 2011-02-23 NOTE — Telephone Encounter (Signed)
Order for INR

## 2011-02-24 ENCOUNTER — Ambulatory Visit (INDEPENDENT_AMBULATORY_CARE_PROVIDER_SITE_OTHER): Payer: Medicare Other | Admitting: Family Medicine

## 2011-02-24 ENCOUNTER — Encounter: Payer: Self-pay | Admitting: Family Medicine

## 2011-02-24 VITALS — BP 142/60 | HR 72 | Temp 98.1°F | Wt 210.1 lb

## 2011-02-24 DIAGNOSIS — C4490 Unspecified malignant neoplasm of skin, unspecified: Secondary | ICD-10-CM | POA: Insufficient documentation

## 2011-02-24 DIAGNOSIS — L989 Disorder of the skin and subcutaneous tissue, unspecified: Secondary | ICD-10-CM

## 2011-02-24 DIAGNOSIS — E785 Hyperlipidemia, unspecified: Secondary | ICD-10-CM

## 2011-02-24 DIAGNOSIS — Z1211 Encounter for screening for malignant neoplasm of colon: Secondary | ICD-10-CM

## 2011-02-24 DIAGNOSIS — E119 Type 2 diabetes mellitus without complications: Secondary | ICD-10-CM

## 2011-02-24 DIAGNOSIS — I10 Essential (primary) hypertension: Secondary | ICD-10-CM

## 2011-02-24 DIAGNOSIS — Z125 Encounter for screening for malignant neoplasm of prostate: Secondary | ICD-10-CM

## 2011-02-24 MED ORDER — INSULIN GLARGINE 100 UNIT/ML ~~LOC~~ SOLN: 25.0000 [IU] | Freq: Every day | SUBCUTANEOUS | Status: DC

## 2011-02-24 NOTE — Assessment & Plan Note (Addendum)
No change in meds.  Recheck bmet in 1 month and then fu with me.  He has had fluctuating cr prev.

## 2011-02-24 NOTE — Assessment & Plan Note (Addendum)
He'll call about fu with Derm. I told him it may be a BCC and it needs fu.  He agreed.

## 2011-02-24 NOTE — Assessment & Plan Note (Signed)
D/w patient [name] for colon cancer screening.  Pt elects for: IFOB

## 2011-02-24 NOTE — Patient Instructions (Signed)
Come back for labs in 1 month and then a 45min office visit with me a few days after that.  You don't have to fast for the labs. Come back for A1c in 3 months and then a 34min office visit with me a few days after that.  You don't have to fast for the A1c.  Call Dr. Nevada Crane about the spot on your leg.  Gradually adjust the lantus as we talked about.  Take care.

## 2011-02-24 NOTE — Progress Notes (Signed)
Diabetes:  Using medications without difficulties:yes Hypoglycemic episodes:no Hyperglycemic episodes:occ, reviewed with patient Feet problems: h/o neuropathy but no skin breakdown Blood Sugars averaging:120-150 in AM eye exam within last year:yes Inc in glucose after coming off actos. See plan.   Hypertension:  Using medication without problems or lightheadedness: yes Chest pain with exertion:no Edema: occ in ble, more on R leg (harvest site for CABG) Short of breath:no Other issues:no  Skin lesion on medial R ankle- not healing- he'll call Dr. Nevada Crane with derm about this.    Elevated Cholesterol: Using medications without problems:yes Muscle aches: no Other complaints:no  PMH and SH reviewed  Meds, vitals, and allergies reviewed.   ROS: See HPI.  Otherwise negative.    GEN: nad, alert and oriented HEENT: mucous membranes moist NECK: supple w/o LA CV: rrr. Sternotomy scar noted PULM: ctab, no inc wob ABD: soft, +bs EXT: trace edema SKIN: no acute rash but lesion noted on R ankle, <1cm.  Rolled border and appears to be poorly healing Prostate gland firm and smooth, mild enlargement, but no nodularity, tenderness, mass, asymmetry or induration.  Diabetic foot exam: Normal inspection No skin breakdown No calluses  Normal DP pulses Normal sensation to light tough and monofilament Nails normal

## 2011-02-24 NOTE — Assessment & Plan Note (Addendum)
Titrate lantus and recheck a1c in 3 months.  Sensation was good on testing today.

## 2011-02-24 NOTE — Assessment & Plan Note (Signed)
No change in meds.  Controlled.

## 2011-02-24 NOTE — Assessment & Plan Note (Signed)
No need to check PSA today.  Prostate slightly enlarged but symmetric w/o nodularity

## 2011-02-25 ENCOUNTER — Encounter: Payer: Self-pay | Admitting: Family Medicine

## 2011-03-02 ENCOUNTER — Other Ambulatory Visit: Payer: Medicare Other

## 2011-03-02 ENCOUNTER — Other Ambulatory Visit (INDEPENDENT_AMBULATORY_CARE_PROVIDER_SITE_OTHER): Payer: Medicare Other | Admitting: Family Medicine

## 2011-03-02 DIAGNOSIS — Z1211 Encounter for screening for malignant neoplasm of colon: Secondary | ICD-10-CM

## 2011-03-02 LAB — FECAL OCCULT BLOOD, IMMUNOCHEMICAL: Fecal Occult Bld: NEGATIVE

## 2011-03-03 ENCOUNTER — Encounter: Payer: Self-pay | Admitting: *Deleted

## 2011-03-21 ENCOUNTER — Other Ambulatory Visit (INDEPENDENT_AMBULATORY_CARE_PROVIDER_SITE_OTHER): Payer: Medicare Other | Admitting: Family Medicine

## 2011-03-21 ENCOUNTER — Ambulatory Visit (INDEPENDENT_AMBULATORY_CARE_PROVIDER_SITE_OTHER): Payer: Medicare Other | Admitting: Family Medicine

## 2011-03-21 DIAGNOSIS — I4891 Unspecified atrial fibrillation: Secondary | ICD-10-CM

## 2011-03-21 DIAGNOSIS — Z7901 Long term (current) use of anticoagulants: Secondary | ICD-10-CM

## 2011-03-21 DIAGNOSIS — I10 Essential (primary) hypertension: Secondary | ICD-10-CM

## 2011-03-21 DIAGNOSIS — Z5181 Encounter for therapeutic drug level monitoring: Secondary | ICD-10-CM

## 2011-03-21 DIAGNOSIS — I4892 Unspecified atrial flutter: Secondary | ICD-10-CM

## 2011-03-21 DIAGNOSIS — E119 Type 2 diabetes mellitus without complications: Secondary | ICD-10-CM

## 2011-03-21 LAB — BASIC METABOLIC PANEL
Calcium: 9.4 mg/dL (ref 8.4–10.5)
GFR: 38.1 mL/min — ABNORMAL LOW (ref >60.00)
Glucose, Bld: 134 mg/dL — ABNORMAL HIGH (ref 70–99)
Potassium: 4 mEq/L (ref 3.5–5.1)
Sodium: 140 mEq/L (ref 135–145)

## 2011-03-21 LAB — POCT INR: INR: 2.8

## 2011-03-21 LAB — HEMOGLOBIN A1C: Hgb A1c MFr Bld: 7.2 % — ABNORMAL HIGH (ref 4.6–6.5)

## 2011-03-21 NOTE — Patient Instructions (Signed)
Continue current dose, check in 4 weeks  

## 2011-03-24 ENCOUNTER — Ambulatory Visit (INDEPENDENT_AMBULATORY_CARE_PROVIDER_SITE_OTHER): Payer: Medicare Other | Admitting: Family Medicine

## 2011-03-24 ENCOUNTER — Encounter: Payer: Self-pay | Admitting: Family Medicine

## 2011-03-24 VITALS — BP 162/58 | HR 68 | Temp 98.1°F | Wt 209.1 lb

## 2011-03-24 DIAGNOSIS — N184 Chronic kidney disease, stage 4 (severe): Secondary | ICD-10-CM | POA: Insufficient documentation

## 2011-03-24 DIAGNOSIS — R609 Edema, unspecified: Secondary | ICD-10-CM

## 2011-03-24 DIAGNOSIS — I10 Essential (primary) hypertension: Secondary | ICD-10-CM

## 2011-03-24 DIAGNOSIS — E119 Type 2 diabetes mellitus without complications: Secondary | ICD-10-CM

## 2011-03-24 NOTE — Patient Instructions (Signed)
Don't change your meds for now (except for adjusting your insulin as we discussed). See Rosaria Ferries about your referral before your leave today. Take care.  I'll see you back in about 2 months, after your A1c is done.

## 2011-03-24 NOTE — Progress Notes (Signed)
HTN fu.  Compliant with meds.  Recent bmet d/w pt.  No cp, sob, ble edema.  See plan. Prev labs reviewed and d/w pt.  Cr 1.8 recently.   He was rushing to get here for appointment, recent family and job stress.  This may have influenced BP today.  Prev lower.  D/w pt re: weight and exercise along with diet and meds.   DM- titrating lantus and AM sugar generally 100-130.  No hypoglycemia.  Not yet due for repeat A1c.    CKD- d/w pt.  Likely related to DM/HTN.  Stable by last bmet, Cr not increased.  D/w pt about trying to maintain as much renal function as possible.    PMH and SH reviewed  ROS: See HPI, otherwise noncontributory.  Meds, vitals, and allergies reviewed.   nad ncat Mmm Rrr, occ ectopy ctab abd obese, not ttp Ext w/o edema Sym radial pulses

## 2011-03-24 NOTE — Assessment & Plan Note (Signed)
Return for A1c, continue diet/exercise.

## 2011-03-24 NOTE — Assessment & Plan Note (Signed)
D/w pt, refer to renal

## 2011-03-24 NOTE — Assessment & Plan Note (Signed)
Controlled.  

## 2011-03-24 NOTE — Assessment & Plan Note (Signed)
On ACE, I wouldn't change meds given last reading before today and Cr.  Will await renal input.  D/w pt about diet and exercise.

## 2011-03-25 ENCOUNTER — Telehealth: Payer: Self-pay | Admitting: Family Medicine

## 2011-03-28 NOTE — Telephone Encounter (Signed)
ERROR

## 2011-03-29 NOTE — Op Note (Signed)
[name], [name]                ACCOUNT NO.:  [id number]   MEDICAL RECORD NO.:  [id number]          PATIENT TYPE:  AMB   LOCATION:  DAY                          FACILITY:  [facility]   PHYSICIAN:  [name], M.D. DATE OF BIRTH:  15-Oct-1932   DATE OF PROCEDURE:  03/29/2007  DATE OF DISCHARGE:                               OPERATIVE REPORT   PREOPERATIVE DIAGNOSIS:  Chronic cholecystolithiasis.   POSTOPERATIVE DIAGNOSIS:  Chronic cholecystolithiasis.   PROCEDURE:  Laparoscopic cholecystectomy and operative cholangiogram.   SURGEON:  Lennie Hummer, M.D.   ASSISTANT:  Nurse.   ANESTHESIA:  General.   [name] is 89, considers himself very well, does have very  significant cardiovascular history, as well, diabetes all well-  controlled at this time.  Symptomatic gallstones were diagnosed.  The  patient has been counseled and he wishes to proceed at this time.  He is  seen, identified, the permit signed.   He is taken to the operating room, placed supine.  General endotracheal  anesthesia administered.  The abdomen was clipped, prepped and shaved in  the usual fashion.  Marcaine 0.25% with epinephrine is used prior to  each incision.  An infraumbilical incision made vertically.  The fascia  identified, opened in the midline.  Peritoneum entered without  complication.  The Hasson catheter placed, tied in place with a  transfascial #1 Vicryl suture.  The abdomen insufflated.  General  peritoneoscopy was unremarkable.  The patient was then positioned a  midepigastric 10 mm trocar inserted under direct vision, two 5 mm  trocars right upper quadrant direct vision.  The gallbladder was  indolent perhaps thickened,  had few adhesions.  It was grasped, placed  on tension.  Careful dissection at the base of the gallbladder revealed  an apparent cystic duct.  This was cleared off completely.  Clip was  placed near the gallbladder on that cystic duct.  Cystic duct was  opened, bile came  forth and then a percutaneously passed catheter was  placed into the cystic duct stump.  A clip was placed.  Real-time  fluoroscopy was used for cholangiography showing quick and rapid dye  through the system into the duodenum and filling biliary tree without  apparent abnormality.  The clip and catheter removed, the stump of  cystic duct triply clipped and divided.  Cystic artery dissected out,  triply clipped and divided and the gallbladder removed from the  gallbladder bed without incident or complication.  No bleeding.  The  gallbladder was placed in EndoCatch bag and then removed from the  abdomen through the infraumbilical incision which was closed securely  under direct vision., inspection and irrigation carried  out all was clear.  All irrigant, CO2, instruments and trocars removed  under direct vision.  Each wound checked and closed with 3-0 Monocryl.  Steri-Strips.  Final counts correct.  He tolerated it well, was  awakened, taken recovery room.  Plan will be to get the patient  discharged this afternoon late, follow him as an outpatient.      [name], M.D.  Electronically Signed  TED/MEDQ  D:  03/29/2007  T:  03/29/2007  Job:  [id number]   cc:   Modesto Charon, MD  Fax: [phone]   Clarene Reamer, MD  [facility]. [place] [address]   Jeffrey D. Katz, MD, [facility] [place]  [place] [address]

## 2011-03-29 NOTE — H&P (Signed)
NAMEOLEGARIO, DOCTOR                ACCOUNT NO.:  [id number]   MEDICAL RECORD NO.:  [id number]          PATIENT TYPE:  EMS   LOCATION:  [facility]                         FACILITY:  [facility]   PHYSICIAN:  Farris Has, MDDATE OF BIRTH:  22-Jun-1932   DATE OF ADMISSION:  09/01/2008  DATE OF DISCHARGE:                              HISTORY & PHYSICAL   PRIMARY CARE Haldon Carley:  Modesto Charon, M.D.   CHIEF COMPLAINT:  Bright red blood per rectum.   Patient is a 77 year old gentleman with a history of coronary artery  disease and diverticulosis with frequent GI bleeds, who presents with  three days of frequent bowel movements with bright red blood per rectum,  the last one particularly increasing in volume, which prompted his ED  visit.  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  On further examination in the ED, it was  actually bloody, but the patient himself could not necessarily determine  that.  He had some nausea and vomiting in the ED but otherwise no  vomiting, no hematemesis.  A couple of days ago, he had chest pain while  exercising.  He actually actively dances every week or so.  While he was  dancing, halfway through it, he developed some chest pain, which caused  him to stop.  When he rested, it went away.  Of note, he reports that he  had his hemoglobin checked on September 1, that is when he had his visit  with Dr. Council Mechanic.  Per patient, his hemoglobin was around 11.  Now, per  ED, 7.7.  Otherwise, no lightheadedness.  Patient does endorse left  lower quadrant tenderness and pain.  Otherwise, review of systems  unremarkable.  Except patient does endorse that generally, he is  somewhat more diaphoretic than usual.   PAST MEDICAL HISTORY:  1. Coronary artery disease.  2. History of diverticulosis with frequent GI bleeds before admissions      for the past 20 years.  In the past, patient did require      transfusion.  3.  History of diabetes.  4. High cholesterol.  5. Hypertension.  6. Sleep apnea.   ALLERGIES:  No known drug allergies.   MEDICATIONS:  1. Crestor 10 mg p.o. daily.  2. Avandia 4 mg p.o. b.i.d.  3. Lantus 25 units subcu daily.  4. Fish oil.  5. Multivitamin.  6. Aspirin 81 mg p.o. daily.  7. Toprol XL 25 mg daily.  8. Lasix 20 mg daily.  9. Iron over-the-counter.  10.Norvasc 10 mg p.o. daily.  11.Diovan 320 mg daily.  12.Viagra as needed.  13.Patient also takes over-the-counter potassium.   SOCIAL HISTORY:  Currently does not smoke or drink.  Lives at home with  his wife.  Very active in his life.   FAMILY HISTORY:  Noncontributory.   PHYSICAL EXAMINATION:  VITALS:  Temperature 99.1, blood pressure 113/65,  pulse 77, respirations 24, satting 98% on room air.  Patient appears to be in no acute distress.  Currently saying that he  actually feels a little bit better  but is somewhat diaphoretic.  Head nontraumatic.  Skin appears to be pale.  NECK:  Supple.  No lymphadenopathy noted.  No JVD noted.  HEART:  Regular rate and rhythm.  No murmurs can be appreciated.  ABDOMEN:  There is slight left lower quadrant tenderness, otherwise  nontender.  No epigastric tenderness noted.  Somewhat obese.  LOWER EXTREMITIES:  No clubbing, cyanosis or edema.  RECTAL:  Per ED, showing bright red blood in rectal vault.   LABS:  White blood cell count 5.9, hemoglobin 7.7, platelets 157,  potassium 4, creatinine 1.39.  BUN 25.  LFTs within normal limits except  for albumin at 3.2.  Sodium 134.   Chest x-ray done today showing stable chest with history of CABG.   EKG showing right bundle branch block, which is similar to before.  No  evidence of ST elevation or depression.  T waves inverted in lead V1,  which is stable.   ASSESSMENT/PLAN:  This is a 77 year old gentleman with a history of  diverticulosis and diverticular bleed __________, who comes in with  bright red blood per rectum.  1.  Bright red blood per rectum, likely gastrointestinal source.  His      BUN versus creatinine is not elevated.  He does not have any      history of upper gastrointestinal bleed.  He does not have any      hematemesis.  I am fairly certain this is likely a lower source.      Have notified [facility] on call about the presence of the patient,      and they agreed to see him in the morning.  For right now, we will      do a clear-liquid diet, make him n.p.o. in the a.m.  Will transfuse      2 units and have 2 more on call.  Frequent CBCs.  Put him in step-      down, as he did appear to lose a lot of blood and slightly      hypotensive.  Will hold all of his BP medications.  Give him      Protonix IV.  Given that he has had recent chest pain, will cycle      cardiac enzymes x3, although I am guessing that this is likely      secondary to anemia and will improve.  2. Diabetes:  Will hold Lantus and Avandia, while not having a good      p.o. intake.  Continue sliding-scale insulin.  3. Prophylaxis:  SCDs and Protonix.      Farris Has, MD  Electronically Signed     AVD/MEDQ  D:  09/02/2008  T:  09/02/2008  Job:  [id number]   cc:   Modesto Charon, MD

## 2011-03-29 NOTE — Discharge Summary (Signed)
NAMEDAXEN, SCOLLON                ACCOUNT NO.:  [id number]   MEDICAL RECORD NO.:  [id number]          PATIENT TYPE:  INP   LOCATION:  4704                         FACILITY:  [facility]   PHYSICIAN:  Docia Chuck. Henrene Pastor, MD      DATE OF BIRTH:  1932/05/25   DATE OF ADMISSION:  09/12/2008  DATE OF DISCHARGE:  09/17/2008                               DISCHARGE SUMMARY   ADDENDUM   In going through the list of discharge medications with the patient, he  asked why he was taking Protonix.  This was started during his previous  hospitalization, when he was admitted with lower GI bleed.  There has  been no evidence of any upper GI bleeding.  He has never had ulcer  disease, does not get reflux symptoms.  Also, his excellent prescription  drug coverage has advised him to take this medication, he would have to  pay 150 or more dollars a month.  As this drug was inadvertently started  for no specific reason, we will discontinue this medication.  Thus, the  corrected medication list does not include Protonix.      Azucena Freed, PA-C      Docia Chuck. Henrene Pastor, MD  Electronically Signed    SG/MEDQ  D:  09/17/2008  T:  09/18/2008  Job:  [id number]   cc:   Gatha Mayer, MD,FACG  Modesto Charon, MD  Carlena Bjornstad, MD, [facility]

## 2011-03-29 NOTE — Consult Note (Signed)
NAMELUCCIANO, Jeremiah                ACCOUNT NO.:  [id number]   MEDICAL RECORD NO.:  [id number]          PATIENT TYPE:  INP   LOCATION:                               FACILITY:  [facility]   PHYSICIAN:  Jeremiah Brasil. Lia Foyer, MD, FACCDATE OF BIRTH:  Apr 11, 1932   DATE OF CONSULTATION:  DATE OF DISCHARGE:                                 CONSULTATION   CHIEF COMPLAINT:  Bleeding.   HISTORY OF PRESENT ILLNESS:  [name] is a 77 year old gentleman who  has been admitted by the [facility].  He has been  seen in consultation by GI.  He has a history of intermittent GI  bleeding, and presented few days ago with bright red blood.  He has  continued to bleed and endoscopy has revealed evidence of significant  diverticular bleed.  His hemoglobin dropped as far as 7 during his  hospitalization and now he has had a couple of episodes of chest pain,  and his enzymes have been positive.  His troponins have been 0.16, 0.18,  0.15 with a CK-MB that is normal.  His heart rate has been slowly  climbing.  We have been asked to consult on the patient for further  evaluation and management.   The patient had an episode of chest pain just a bit ago, but it has been  relieved with the initiation of IV nitroglycerin.  Many of his medicines  have been withheld.   His past medical history is remarkable for coronary artery bypass graft  surgery in 1995 with a saphenous vein graft to the PDA and PL and a  saphenous vein graft to the OM.  He had coronary artery bypass in 2006  for recurrent restudy, and a left internal mammary to the LAD, saphenous  vein graft to the diagonal, and a saphenous vein graft to the PD.  The  patient also has a history of hypertension, dyslipidemia, and  hypertension.  He has a positive family history.  He has a history of  macular degeneration.  He does have left ventricular hypertrophy.  He  also has a history of diverticulosis with acute blood loss anemia.  He  has  also had a history of laparoscopic cholecystectomy and right carotid  endarterectomy.   The patient has no known drug allergies, but some intolerance to  statins.   Medications include:  1. Crestor 10 mg daily.  2. Protonix 40 mg daily currently.  3. Previously, he was on Avandia 4 b.i.d.  4. Lantus 25 units daily.  5. Crestor 10 mg daily.  6. Norvasc 10 mg daily.  7. Iron 55 b.i.d.  8. Diovan 320 mg daily.  9. Lasix 40 mg one-half daily.  10.Toprol-XL 25 mg daily.  11.Aspirin 81 mg daily.  12.Fish oil b.i.d.   SOCIAL HISTORY:  The patient lives in [place].  He quit smoking 40  years ago.  He is retired from [employer].  He manages rental  properties.   FAMILY HISTORY:  His mother died at 62 of heart failure.  Father died at  46 of myocardial infarction.  Sister had an MI at 49.   REVIEW OF SYSTEMS:  The patient has not had fever, chills, or sweats.  He has not had cough productive of sputum.  He has had an episode of  chest pain associated with some dyspnea of effort.  He was dancing the  other night, and had to quit.  He is also remarkable for bright red  blood per rectum.  Otherwise, negative review of systems.   On examination, he is alert and oriented gentleman.  He currently is in  no distress.  He is on an IV nitroglycerin drip.  His sat is 99% on room  air.  His temperature is 98.1, pulse is 99, respiratory rate 25.  His  blood pressure is 130/48.  With the episode of chest pain, it arose into  170s.  He is now back down on an IV nitroglycerin drip.  The lung fields  actually reveal decreased breath sounds in the bases.  Cardiac rhythm is  regular with a very soft systolic ejection murmur.  Abdomen is soft  without any obvious masses.  There is no definite extremity edema noted,  the neck veins are not distended at the present time.  There is a  carotid endarterectomy scar.   Electrocardiogram demonstrates sinus tachycardia with right bundle left  anterior  fascicular block.  There is some lateral ST depression  suggestive of lateral ischemia.   Hemoglobin 7.2, hematocrit 21.3, BUN of 23, and creatinine 1.29.  His  troponins are as noted.  His fecal occult blood is positive.   IMPRESSION:  1. Diverticular bleeding with acute blood loss anemia leading to      recurrent angina pectoris.  2. History of coronary artery disease status post coronary bypass      graft surgery x2.  3. Hypertension.  4. Diabetes.  5. Dyslipidemia.  6. History of carotid endarterectomy.  7. Prior laparoscopic cholecystectomy.   PLAN:  1. I agree with transfusion.  I would try to transfuse him up to a      hemoglobin of 10 and try to continue to keep him at that point.  2. With hypertension, I would currently readd his beta-blocker and add      his other medicines as he continues to need them.  We will resume      some nitroglycerin overnight.  3. We will continue to continuously reassess with you.      Jeremiah Brasil. Lia Foyer, MD, [facility]  Electronically Signed     TDS/MEDQ  D:  09/03/2008  T:  09/04/2008  Job:  [id number]   cc:   Carlena Bjornstad, MD, [facility]  Mikki Santee Shower

## 2011-03-29 NOTE — Assessment & Plan Note (Signed)
[facility]                            CARDIOLOGY OFFICE NOTE   [name], [name]                       MRN:          [id number]  DATE:05/17/2007                            DOB:          1932-04-26    [name] is doing well.  He has a significant coronary disease, but is  stable.  I had cleared him for gallbladder surgery.  He did have some  pain with this related to the surgery, but no cardiac pain and he has  now stabilized.  He has gained some weight over this period.  We do not  think it is significant fluid change.  Now that he can become more  active, he hopes to begin to lose some weight.   PAST MEDICAL HISTORY:   ALLERGIES:  No known drug allergies.   MEDICATIONS:  1. Crestor.  2. Norvasc.  3. Diovan.  4. Avandia.  5. Lantus.  6. Fish oil.  7. Aspirin.  8. Toprol.  9. Lasix.  Currently, he is taking 20 mg of his Lasix.  He feels that      40 mg affects his electrolytes too much.   OTHER MEDICAL PROBLEMS:  See the complete list on my note of February 14, 2007.   REVIEW OF SYSTEMS:  He is feeling well in general.  He has some  shortness of breath with significant exertion.   PHYSICAL EXAM:  Blood pressure 160/67 with a pulse of 54.  He does have  some edema.  Weight is 205, which is increased, and we talked about  weight loss.  He has no xanthelasma.  There is normal extraocular motions.  There are no carotid bruits.  There is no jugular venous distension.  LUNGS:  Clear.  Respiratory effort is not labored.  CARDIAC:  Reveals an S1 with an S2.  There are no clicks or significant  murmurs.  ABDOMEN:  Obese and soft.  He does have trace to 1+ peripheral edema.  He says it varies from day to day.  There are no musculoskeletal  deformities.   No labs are done today.   PROBLEMS:  Listed on my note of February 14, 2007.  1. Hypertension.  I have encouraged weight loss and am not changing      his meds today.  I will see him back in 3  months and we will      continue to watch his blood pressure and titrate his meds further      if needed.  2. History of coronary artery bypass grafting and redo, and he is      stable with good left ventricular function.  3. Peripheral edema.  He has mild edema, and this is to be followed.   Overall, status is stable.  We will redo his meds and see him in 3  months.     Carlena Bjornstad, MD, [facility]  Electronically Signed    JDK/MedQ  DD: 05/17/2007  DT: 05/17/2007  Job #: [id number]   cc:   Modesto Charon, MD

## 2011-03-29 NOTE — Assessment & Plan Note (Signed)
[facility]ALTHC[facility]                            CARDIOLOGY OFFICE NOTE   [name], [name]                       MRN:          [id number]  DATE:09/04/2007                            DOB:          05-20-1932    [name] is seen for follow-up.  I saw him last in July of 2008.  We  had hoped for some weight loss and for better blood pressure control.  He has in fact gained a pound or two.  His systolic pressure remains  mildly elevated.  He is not having any chest pain.  He has had some  slight chest discomfort with exertion at water aerobics.  Otherwise no  significant pain.  He has not had syncope or presyncope.   PAST MEDICAL HISTORY:   ALLERGIES:  No known drug allergies.   MEDICATIONS:  1. Crestor 10.  2. Norvasc 5 (to be increased to 10).  3. Diovan 160.  4. Avandia.  5. Lantus.  6. Fish oil.  7. Aspirin.  8. Toprol XL 25.  9. Lasix 20 mg daily.   OTHER MEDICAL PROBLEMS:  See the list on my note of February 14, 2007.   REVIEW OF SYSTEMS:  He mentions some aches and pains in his shoulders  and arms when he gets up after sitting.  He wonders if this is related  to Crestor.  I am not convinced but of course I cannot be sure.  With  his vascular disease it would clearly be optimal to try to keep him on a  Statin if we can.  Otherwise his review of systems is negative.   PHYSICAL EXAMINATION:  Weight is 207 pounds, up by two pounds on our  scale.  Blood pressure is 156/66.  His pulse is 61.  The patient was oriented to person, time, and place.  Affect is normal.  HEENT:  Reveals no xanthelasma.  He has normal extraocular motion.  NECK:  There is a right carotid bruit.  There is no jugular venous  distention.  LUNGS:  Clear.  Respiratory effort is not labored.  CARDIAC:  Reveals an S1 with an S2.  There are no clicks or significant  murmurs.  ABDOMEN:  Obese.  He has 2+ distal pulses.  He has no significant  peripheral edema.   EKG reveals old  right bundle branch block and increased voltage  compatible with left ventricular hypertrophy.   PROBLEMS:  1. History of a diverticular bleed.  2. Gastroesophageal reflux disease.  3. Carotid disease.  It is a one year follow-up in November for his      carotid disease which is well-documented.  We will be sure that      these are scheduled.  He does have a right carotid bruit and known      prior disease.  4. Hypertension.  We still have not completely resolved this issue.      We will push his Norvasc from 5 to 10 mg daily.  5. Right bundle branch block.  6. History of macular degeneration.  7. History of CABG in 1995  and redo in 2006.  8. Good LV function.  9. Sleep apnea.  10.Significant skin problems with allergies to dust mites.  11.HDL that is low.  We really need to consider a Niaspan in the      future.  12.Peripheral edema.  This is improved.  13.History of mild increase in BUN for a period of time that is      stabilized.  14.Need for gallbladder surgery and the patient was cleared for his      gallbladder surgery and this was done successfully.  15.Some musculoskeletal aches and pains.  He is on Crestor and he will      talk with Dr. Council Mechanic about this.   He clearly needs to lose weight.     [name], [name], [name]  Electronically Signed    JDK/MedQ  DD: 09/04/2007  DT: 09/05/2007  Job #: [id number]   cc:   Modesto Charon, [name]

## 2011-03-29 NOTE — Discharge Summary (Signed)
NAMEFINNIGAN, ORBIN                ACCOUNT NO.:  [id number]   MEDICAL RECORD NO.:  [id number]          PATIENT TYPE:  INP   LOCATION:  4704                         FACILITY:  [facility]   PHYSICIAN:  Docia Chuck. Henrene Pastor, MD      DATE OF BIRTH:  Jan 08, 1932   DATE OF ADMISSION:  09/12/2008  DATE OF DISCHARGE:  09/17/2008                               DISCHARGE SUMMARY   ADMITTING DIAGNOSES:  1. Recurrent lower gastrointestinal bleed, presumed diverticular      source.  2. Recurrent acute blood loss anemia.  History of acute blood loss      anemia during recent September 02, 2008, through September 07, 2008,      admission which required transfusion with about 12-13 units of      packed red blood cells.  3. Coronary artery disease:  The patient had angina during his recent      admission associated with anemia.  He has not had any angina since      discharge.  4. Insulin-dependent diabetes mellitus, well controlled.  5. Renal insufficiency currently, probably secondary to dehydration      and associated prerenal azotemia.  6. Cardiomyopathy, latest echocardiogram performed on September 05, 2008, showing left ventricular ejection fraction of between 55 and      65%.  7. Hypertension.  8. Type 2 diabetes mellitus, requires insulin for management.  9. Obstructive sleep apnea, uses continuous positive airway pressure      at home.  10.Macular degeneration.  11.Hyperlipidemia.  12.Status post laparoscopic cholecystectomy.  13.Status post right carotid endarterectomy.   DISCHARGE DIAGNOSES:  1. Recurrent diverticular bleed, resolved.  2. Recurrent acute blood loss anemia, resolved.  The patient      transfused during this admission with 4 units of packed red blood      cells.  3. Insulin-dependent diabetes mellitus.  4. Transfusion reaction following completion of final transfusion.      The patient suffered some swelling and rash in his groin and      axilla.  It resolved quickly with a  single dose of intravenous      Benadryl.  5. Coronary artery disease.  The patient suffered no angina during      this admission.  Decision has been made to stop the Nitropaste,      which had been prescribed during his recent admission.  Also on      hold is his Diovan.  6. Renal insufficiency, resolved with fluid resuscitation.   PROCEDURES:  None.   CONSULTATION:  Dr. Gwendolyn Grant for Internal Medicine.   BRIEF HISTORY:  Mr. Bloodsworth is an extremely pleasant 77 year old white  male with well-managed chronic medical problems.  He has had a history  of diverticular bleeds over the last couple of decades.  He had been  admitted to the hospital 3 or 4 times between [place] but had not  had any significant recurrent GI bleeding until recent admission of  September 02, 2008, through September 07, 2008.  He does periodically have  minor lower GI  bleeding, which resolves within 24 hours and for which he  does not seek medical attention.  However, during this recent admission,  he required numerous transfusions of between 12 or 13 units of packed  red blood cells.  The patient's bleeding had resolved, and his  hemoglobin was stable when he left the hospital.  The hemoglobin nadir  was 6.6 and the hemoglobin was 10.4 at the time of discharge.  He had  undergone a colonoscopy on September 03, 2008, showing pan diverticulosis  and fresh blood.  He had not had any further studies such as nuclear  bleeding scan or arteriogram.  He had also been seen by Cardiology  because of mildly elevated troponin and some incidences of chest pain,  which were short lived.   The patient had gone home, had ultimately resumed having normal brown  bowel movements, and was feeling well until the evening of September 11, 2008, when he noticed a dark pasty stool, the next morning, the morning  of admission, he had blackish bloody soft bowel movements and was  fatigued.  He called the GI office and was sent over  to the hospital for  direct admission.  He did spend some brief time in the emergency room  because bed was not available.  On arrival, pulse was 73 and blood  pressure 111/60.  His hemoglobin measured 9.4, which represents a 1 gram  drop from his discharge level.  He was admitted to the GI service and  Internal Medicine was asked to follow along with [place] for management of  his other medical problems.   LABORATORY:  Hemoglobin on admission was 9.4, nadir 8.8 on September 12, 2008, and it was 11.4 at discharge.  Hematocrit was 33.2 at discharge.  White blood cell count 5.5.  MCV 92.1.  Platelets 167.  PT 14.4.  INR  1.1.  Capillary blood glucoses ranged anywhere from 105 up to 170.  The  sodium 141, potassium 4.2, and chloride 110.  CO2 27.  Serum glucose  131.  BUN initially 27, on recheck it was 17.  The creatinine initially  1.28, on recheck 1.23.  Calcium 8.9.  Hemoglobin A1c 5.4 representing a  mean plasma glucose of 108.  Homocysteine level 11.2.  Fecal occult  blood positive.   RADIOLOGIC IMAGING:  Nuclear medicine red blood cell scan showed no  evidence for active gastrointestinal bleeding on September 13, 2008.   HOSPITAL COURSE:  The patient was admitted to a telemetry-monitored bed  in stable condition.  On rectal exam, he had a blackish red-colored  fecal occult blood positive stool.  He had no abdominal tenderness.  He  was started on a diet of full liquids.   The patient had frequent monitoring of his hemoglobin and hematocrit.  This fluctuated and he did end up receiving a total of 4 units of packed  red blood cells.  Ultimately, the patient stopped having bowel  movements.  In fact, by the day of discharge, he had not had a bowel  movement for about 3-1/2 days.  His diet was advanced to a diabetic  type.   Many of the patient's cardiac meds were held during the hospitalization  and some of them were restarted, others continued to be held.  These  meds were managed by Dr.  Asa Lente.  The patient received IV fluids, but  ultimately, these were discontinued.  He was stable throughout his  hospitalization and was deemed ready for discharge on September 17, 2008.   Diet at discharge is a carbohydrate modified.   APPOINTMENTS AND FOLLOWUP:  Follow up with Dr. Carlean Purl on October 01, 2008, at 9:45 and with Dr. [name] on October 05, 2008.   Medications at discharge include:  1. Iron sulfate 325 mg once daily.  Note that this dose is decreased      from twice daily, which was his dose at admission.  2. Norvasc 10 mg one half tablet once daily.  3. Crestor 10 mg every other day.  4. Diovan 320 mg one half tablet daily.  This medication is on hold      and can be restarted as deemed necessary by Dr. [name].  5. Lasix 40 mg one half tablet daily.  6. Toprol-XL 25 mg 1 tablet daily.  7. Avandia 4 mg one half tablet twice daily.  8. Protonix 40 mg once daily.  9. Multivitamin once daily.  10.Fish oil 1000 mg twice daily.  11.Lantus insulin 15 units subcutaneously at nightly.  12.Weekly allergy shots.  13.He takes Viagra p.r.n.   Discontinued medication again is the Nitropaste, nitroglycerin paste.  He was taking a 2% ointment, 1-1/2 inches applied from 8:00 a.m. to 8:00  p.m.  He is to continue to hold all aspirin and nonsteroidal-type  products.      Azucena Freed, PA-C      Docia Chuck. Henrene Pastor, MD  Electronically Signed    SG/MEDQ  D:  09/17/2008  T:  09/18/2008  Job:  [id number]   cc:   Gatha Mayer, MD,FACG  Carlena Bjornstad, MD, [facility]  Modesto Charon, MD

## 2011-03-29 NOTE — H&P (Signed)
[name], [name]                ACCOUNT NO.:  [id number]   MEDICAL RECORD NO.:  [id number]          PATIENT TYPE:  INP   LOCATION:  4704                         FACILITY:  [facility]   PHYSICIAN:  Lowella Bandy. Olevia Perches, MD     DATE OF BIRTH:  05/26/1932   DATE OF ADMISSION:  09/12/2008  DATE OF DISCHARGE:                              HISTORY & PHYSICAL   ADMITTING PHYSICIAN:  Lowella Bandy. Olevia Perches, MD   PRIMARY CARE PHYSICIAN:  Modesto Charon, MD   GASTROENTEROLOGY PHYSICIAN:  Gatha Mayer, MD,FACG   CARDIOLOGIST:  Carlena Bjornstad, MD, [facility]   REASON FOR ADMISSION:  Recurrent dark stools in a patient with a recent  history of a extensive lower GI bleeding.   HISTORY:  [name] is a very pleasant 77 year old white male.  He had  a diverticular bleed and was admitted for this from September 02, 2008, to  September 07, 2008.  He was transfused with a total of about 12-13 units  of packed red blood cells during that admission.  Hemoglobin nadir was  6.6, it was 10.4 at the time of discharge.  He underwent a colonoscopy  by Dr. Carlean Purl on September 03, 2008.  This showed fresh blood and pan  diverticulosis.  The patient did not have a nuclear bleeding scan.  He  was seen by Cardiology because of mildly elevated troponin associated  with chest pain.  They did not rule him in for any MI but felt that he  was having anemia associated angina.  The patient's coags were normal  during his last admission.  He went home in stable condition, had  resumed having brown normal-looking stools.  At discharge, he was  advised to continue to hold aspirin and to not take any ibuprofen or  Aleve, which he had been inclined to take prior to that admission.   Yesterday morning, the patient had a normal-looking, formed brown stool.  Yesterday evening, the stool looked a little pasty and was darker in  color.  This morning, it was black, and he saw a little bit of blood in  it.  The stool itself was soft.  He was not  having any abdominal pain,  but he was experiencing significant fatigue.  His feet felt laden when  he was walking.  He was not having any chest pain, neck pain, blurry  vision, or palpitations.  He called the GI office and was advised to  come to the hospital for direct admission.  Initially, he was seen in  the emergency room because of direct bed for admission was not yet  available.  He was stable on arrival to the emergency room.   PAST MEDICAL HISTORY:  1. Coronary artery disease.  The patient had a CABG in the 1996 and      had redo CABG in October 2006.  2. He has a cardiomyopathy.  3. Hypertension.  4. Diabetes mellitus, type 2, insulin requiring.  5. Obstructive sleep apnea.  6. Macular degeneration.  7. Recent history of diverticular bleed from September 02, 2008, through  September 07, 2008, requiring 12-13 units of blood.  The patient had      had prior admissions for diverticular bleeding from [place] to 1992.      He was admitted probably 3 or 4 times but only in 1992, did he      require blood transfusion and of course needed blood transfusion      with the recent admission.  8. Hyperlipidemia.  9. Status post laparoscopic cholecystectomy by Dr. Annabell Sabal.  10.Status post right carotid endarterectomy.   CURRENT MEDICATIONS:  1. Lantus insulin 15 units subcutaneous at night.  2. Weekly allergy shots.  3. Iron sulfate 325 mg twice daily.  4. Fish oil 100 mg twice daily.  5. Norvasc 10 mg one half p.o. daily.  6. Crestor 10 mg once every other day.  7. Diovan 320 mg one half daily.  8. Furosemide 20 mg one half daily.  9. Toprol-XL 25 mg once daily.  10.Avandia 4 mg one half p.o. twice daily.  11.A 2% nitroglycerin ointment, 1-1/2 inch 8 a.m. to 8 p.m.  12.Protonix 40 mg daily.  13.Viagra p.r.n.   ALLERGIES:  None known.   FAMILY HISTORY:  A sibling suffered from coronary disease, MI and some  of them underwent CABG.  His mother had a history of kidney  failure.   SOCIAL HISTORY:  The patient is a widower.  He has a steady girlfriend  who is attentive.  He lives in [place] and does not smoke, does not  drink.   REVIEW OF SYSTEMS:  CONSTITUTIONAL:  He is weak today, but yesterday, he  was quite active, doing work outside.  MUSCULOSKELETAL: Denies back  pain.  NEUROLOGIC:  No headaches, no dizziness, no syncope or  presyncope.  ENT:  No sore throat.  No pain with swallowing.  CARDIOVASCULAR:  No chest pain, no palpitations.  Does say that his legs  are a little bit more swollen than usual today.  He thinks it is because  he was on his feet a lot yesterday.  RESPIRATORY:  No shortness of  breath.  No cough.  GASTROINTESTINAL:  As above.  GENITOURINARY:  No  nocturia.  No frequency.  ALLERGY/IMMUNE:  The patient is up to date on  his routine flu vaccine, has not had H1N1 vaccine.  ENDOCRINE:  No  excessive thirst.  GENERAL:  The patient goes out dancing 2-3 times  weekly.  He also works out at [employer] 3 times weekly.   Prior colonoscopy was last done on September 03, 2008.  This showed pan  diverticulosis and fresh blood in the ascending to sigmoid colon.  It  also showed internal hemorrhoids.   LABORATORY:  On September 07, 2008, his hemoglobin was 10.4.  Yesterday at  Dr. Melanie Crazier office, the hemoglobin was 11.7, today in the emergency  room, it is 9.4.  Hematocrit today is 28, compared with 34.3 yesterday.  White blood cell count is 5.5.  MCV is 92.  Platelets 167.  PT 14.4, INR  1.1.  Sodium 141, potassium 4.2, chloride 110, CO2 27, BUN 27,  creatinine 1.2, and glucose 131.  For comparison on September 01, 2008,  when he was admitted, his BUN was 25 and his creatinine was 1.3 and at  discharge, the BUN was 14 and the creatinine was 1.3.   X-rays and imagings, none were obtained for this admission.   PHYSICAL EXAMINATION:  VITAL SIGNS:  Blood pressure 111/60, pulse 73,  respirations 20, and room  air saturation 96%.  GENERAL:  The  patient is a pleasant, obese white male who looks somewhat  pale.  HEENT:  Sclerae are nonicteric.  Conjunctivae pink.  Extraocular  movements are intact.  ENT, the oral mucosa is pink and moist and clear.  Dentition is in fair repair.  NECK:  There is no JVD, no masses, no thyromegaly.  LUNGS:  Clear to auscultation and percussion bilaterally.  The patient  does not display shortness of breath nor is he coughing.  CARDIOVASCULAR:  There is regular rate and rhythm.  No murmurs, rubs, or  gallops.  ABDOMEN:  Obese with active bowel sounds.  There is no tenderness and no  distention.  No masses, no bruits, no hepatosplenomegaly.  EXTREMITIES:  There is pedal edema which is minimally pitting and is a  bit worse on the right compared with the left.  RECTAL:  Black soft stools with some inclusions of red-appearing stool.  It is obviously blood.  No masses.  The prostate is somewhat enlarged  and smooth.  NEUROLOGIC:  The patient is alert and oriented x3.  There is no tremor.  He is able to sit up and move about the bed without difficulty and  without assistance.   IMPRESSION:  1. Recurrent lower gastrointestinal bleed, likely diverticular.  2. Acute blood loss anemia, recurrent with nearly a 2.5 gram drop in      his hemoglobin within the last 24 hours.  3. Recent slight increase in troponins without diagnosis of myocardial      infarction.  The patient is not currently having any angina.  Note      that during the last hospitalization, it was recommended that the      patient's hemoglobin be kept at or above 10 per Dr. Kae Heller      recommendation.  4. History of coronary artery disease.  5. Insulin-dependent diabetes mellitus.  6. Renal insufficiency:  With an elevated BUN and creatinine, this is      probably just some dehydration associated with volume loss but it      does raise the question as to whether the BUN is elevated because      of an upper GI bleeding source, this is not as  likely as him having      a recurrent diverticular bleed but it should be considered.   PLAN:  1. Transfuse the patient with 1 unit to start and follow his      hemoglobin, hematocrits and transfuse further as needed.  2. Admit to telemetry bed.  3. Allow full liquid diet.  4. If the patient has another actively bloody bowel movement, we will      initiate nuclear red blood cell scan and if this test is positive      then we can proceed to an angiogram and with a goal being      embolization of arterial source for the patient's bleeding.  If      there is a need to rule out an upper GI bleed, then the patient      will need to undergo an upper endoscopy.      Azucena Freed, PA-C      Lowella Bandy. Olevia Perches, MD  Electronically Signed    SG/MEDQ  D:  09/12/2008  T:  09/13/2008  Job:  [id number]

## 2011-03-29 NOTE — Assessment & Plan Note (Signed)
[facility]                            CARDIOLOGY OFFICE NOTE   [name], [name]                       MRN:          [id number]  DATE:03/11/2008                            DOB:          [birth year]    [name] is seen for cardiology follow-up.  I saw him last on September 04, 2007.  He has not had any chest pain.  He is going about full  activities.  He does have some muscle aches and pains.  Dr. Council Mechanic  had switched him from Lipitor to Crestor in the past, and this seemed to  have helped for a period of time concerning the muscle aches and pains,  but now they appeared to have returned.  It is not a progressing  problem, and it is not severe, and he will continue the medications he  is on for now.  It is very important for him to try to remain on a  statin if possible.   PAST MEDICAL HISTORY:   ALLERGIES:  No known drug allergies.   MEDICATIONS:  1. Crestor 10.  2. Diovan 160 (to be increased to 320).  3. Avandia 4 b.i.d.  4. Lantus 25.  5. Fish oil.  6. Multivitamin.  7. Aspirin 81.  8. Toprol XL 25.  9. Lasix 20.  10.Iron.  11.Norvasc 5 mg daily.   OTHER MEDICAL PROBLEMS:  See the complete list below.   REVIEW OF SYSTEMS:  Other than the HPI, his review of systems is  negative.   PHYSICAL EXAMINATION:  Blood pressure today is 150/61 with a pulse of  62.  The patient at home says that his systolic pressure is in the mid  140.  Weight is 201 pounds.  This is down a few pounds since his last  visit to our office.  HEENT:  Reveals no xanthelasma.  He has normal extraocular motion.  There are no carotid bruits.  There is no jugular venous distention.  LUNGS:  Are clear.  Respiratory effort is not labored.  CARDIAC EXAM:  Reveals a S1 with a S2.  There are no clicks or  significant murmurs.  There is a 3/6 systolic murmur heard at the left  sternal border.  ABDOMEN:  Is soft but obese.  He has no significant peripheral edema.   EKG reveals old right bundle branch block with sinus bradycardia.   PROBLEMS INCLUDE:  1. History of a diverticular bleed in the past.  2. Gastroesophageal reflux disease.  3. Carotid disease.  His last Dopplers were done in October 2008.      Recommendation is for follow-up in 1 year.  He has slight increase      in velocities, but carotid disease overall was stable.  He has 40-      59% bilateral internal carotid artery stenoses.  4. Hypertension.  We had last pushed his Norvasc from 5 to 10, and he      did not feel well with this, and he continues to take 5.  We will      increase his Diovan from 160  to 320 mg.  5. Old right bundle branch block.  6. History of macular degeneration.  7. History of coronary disease with CABG in 95 and a redo in 2006.  8. Sleep apnea.  9. Significant skin problems with allergies that dust mite.  10.Low HDL.  I am hesitant to push more medications as need to try to      keep him on his statin.  11.Peripheral edema stable.  12.History of a mild increased BUN for a period of time that      stabilized.  13.Status post cholecystectomy.  14.Muscle aches and pains while on Crestor.  I have decided not to      change his medicines at this time.  15.Systolic murmur.  It is time to assess a 2-D echocardiogram to      assess this murmur further.  This will be scheduled, and I will see      him back for follow-up.     Jeremiah Bjornstad, MD, [facility]  Electronically Signed    JDK/MedQ  DD: 03/11/2008  DT: 03/11/2008  Job #: [id number]   cc:   Modesto Charon, MD

## 2011-03-29 NOTE — Assessment & Plan Note (Signed)
[facility]                            CARDIOLOGY OFFICE NOTE   DAMEL, CERTO                       MRN:          [id number]  DATE:04/02/2008                            DOB:          1932-05-28    Mr. Scheid is doing well.  I saw last on March 11, 2008.  He is not  having any chest pain.  He is not having any significant shortness of  breath.  He had a systolic murmur.  We did proceed with a 2-D  echocardiogram.  Study shows that he has excellent LV function.  In  fact, his EF is on the high side of 70%.  There was no left ventricular  outflow tract gradient.  He does have at least moderate LVH.  He has  some mild aortic sclerosis, but no stenosis.  He is doing well.  I have  reviewed this study with him.  We also had increased his Diovan dose.  His diastolic pressure has come down little further.  Systolics range  from 0000000.  I believe his overall mean arterial pressure is now good.  He does not tolerate Norvasc at 10 mg, and it is continued at 5 mg.   PAST MEDICAL HISTORY:   ALLERGIES:  NO KNOWN DRUG ALLERGIES.   MEDICATIONS:  1. Crestor 10.  2. Avandia 4 b.i.d.  3. Lantus.  4. Fish oil.  5. Multivitamin.  6. Aspirin 81.  7. Toprol-XL 25.  8. Lasix 20.  9. Iron.  10.Norvasc 5.  11.Diovan 160 b.i.d.  12.Allergy shots weekly.  13.A small dose of potassium.   OTHER MEDICAL PROBLEMS:  See the extensive list on the note of March 11, 2008.   REVIEW OF SYSTEMS:  He is really feeling well, and his review of systems  is otherwise negative.   PHYSICAL EXAMINATION:  VITAL SIGNS:  Blood pressure today in our office  reveals his systolic pressure is slightly higher than any of his  reported numbers at home.  It is recorded at 160.  I believe that it is  elevated for being here today.  He has a very complete loss of his home  blood pressures, and his systolic is never this time.  His diastolic is  62.  Pulse rate is 60.  GENERAL:  The  patient is oriented to person, time, and place.  Affect is  normal.  HEENT:  Reveals no xanthelasma.  He has normal extraocular motion.  NECK:  There are no carotid bruits.  There is no jugular venous  distention.  LUNGS:  Clear.  Respiratory effort is not labored.  CARDIAC:  Reveals an S1 with an S2.  He is a 2/6 systolic murmur.  EXTREMITIES:  There is no peripheral edema.   PROBLEMS:  Are listed completely on the note of March 11, 2008.  #4.  Hypertension.  I will not be changing his medicines any further.  #15.  Systolic murmur.  He has mild aortic valve sclerosis.   The patient does have significant left ventricular hypertrophy.  There  are no other changes that would  be made at this time.  He has no left  ventricular outflow tract gradient.     Carlena Bjornstad, MD, [facility]  Electronically Signed    JDK/MedQ  DD: 04/02/2008  DT: 04/02/2008  Job #: [id number]   cc:   Modesto Charon, MD

## 2011-03-29 NOTE — Assessment & Plan Note (Signed)
[facility]                            CARDIOLOGY OFFICE NOTE   [name], [name]                       MRN:          [id number]  DATE:09/25/2008                            DOB:          Aug 03, 1932    [name] is doing well.  He had been hospitalized with a significant  diverticular bleed admitted from September 12, 2008, to September 17, 2008.  Fortunately, he stabilized.  During this period, he has had major GI  diverticular bleed.  This has all stabilized.  He is not having any  chest pain.  He is not having any significant shortness of breath.  His  blood pressure is slightly higher than we wanted, and we will resume  some of his medications at their full dose.   ALLERGIES:  No known drug allergies.   MEDICATIONS:  See the flow sheet.   REVIEW OF SYSTEMS:  There is no GI or GU symptoms currently.  He has no  skin rashes, fever, or chills.  His review of systems is negative.   PHYSICAL EXAMINATION:  Blood pressure today is 170/74.  His pulse is 73.  The patient is oriented to person, time, and place.  Affect is normal.  HEENT reveals no xanthelasma.  He has normal extraocular motion.  There  are no carotid bruits.  There is no jugular venous distention.  Lungs  are clear.  Respiratory effort is not labored.  Cardiac exam reveals an  S1 with an S2.  There are no clicks or significant murmurs.  The abdomen  is soft.  He has no peripheral edema.   Problems are listed in the note of March 11, 2008.  #1.  History of diverticular bleed with recent recurrent significant  bleed which has now stabilized.  #3.  Carotid disease.  He has had a followup Doppler today and is  stable.  #4.  Hypertension.  It is now time to reincrease his Diovan back to 320.  The dose had been lowered when he was in the hospital.  I will see him  back in 6 months.     Carlena Bjornstad, MD, [facility]  Electronically Signed    JDK/MedQ  DD: 09/25/2008  DT: 09/25/2008  Job #:  [id number]

## 2011-03-29 NOTE — Discharge Summary (Signed)
[name], [name]                ACCOUNT NO.:  [id number]   MEDICAL RECORD NO.:  [id number]          PATIENT TYPE:  INP   LOCATION:  6525                         FACILITY:  [facility]   PHYSICIAN:  Evie Lacks. Plotnikov, MDDATE OF BIRTH:  08-27-1932   DATE OF ADMISSION:  09/02/2008  DATE OF DISCHARGE:  09/07/2008                               DISCHARGE SUMMARY   FINAL DISCHARGE DIAGNOSES:  1. Severe lower gastrointestinal bleeding of unclear source most      likely diverticular bleeding.  2. Severe anemia, status post 12 or 13 red blood cell units      transfusion.  3. Cardiac ischemia due to severe anemia, improved.  4. Elevated troponin.  5. Coronary artery disease.  6. Angina.  7. Cardiomyopathy.  8. Dyslipidemia.  9. Hypertension.  10.Type 2 diabetes.   CONSULTATIONS:  1. Gastroenterology, Gatha Mayer, MD, [facility]  2. Cardiology, Champ Mungo. Lovena Le, MD   PROCEDURES:  Colonoscopy.   HISTORY:  The patient is a 77 year old male with a previous history of  diverticular bleedings who was admitted by Dr Roel Cluck passing red blood  per rectum on September 01, 2008.  For the details, please address to her  history and physical.  He was taking quite a bit of aspirin prior.   HOSPITAL COURSE:  During the course of hospitalization, he continued to  bleed profusely via rectum.  He received total of 12-13 units of red  blood cells.  He developed cardiac ischemia with angina and elevated  troponin.  He was seen by Cardiology.  His condition has gradually  improved.  On the day of discharge, he is feeling well.  No chest pain  or shortness of breath.  No syncope.  No bowel movement for 2-3 days  prior to discharge.  Blood sugar 130-174, blood pressure 149/54, heart  rate 64, respirations 24, temperature 99.1 today, and  O2 sats 95% on  room air.  His hemoglobin is 10.4, platelets 104, white count 7.5, and  MCV 91.8.  He is in no acute distress.  HEENT with moist mucosa.  Skin  is without  pallor.  Neck is supple.  Lungs are clear.  No wheezes or  rales.  Heart has S1 and S2, no gallop, grade [id number] systolic murmur.  Abdomen is obese, soft and nontender without organomegaly or mass felt.  Lower extremities with trace edema.  Calves nontender.  He is alert,  oriented, and cooperative.   LABORATORY RESULTS:  Colonoscopy by Dr. Carlean Purl did reveal any obvious  source of bleeding.  EKG with sinus tachycardia, right bundle-branch  block, and left anterior fascicular block.  There was T-wave abnormality  consistent with lateral ischemia.  His hemoglobin on admission was 7.7  and platelets 157.  LFTs within normal limits.  He did have elevated  troponin, which has resolved.  Creatinine was 1.3, potassium 3.6, and  platelet count 98-96.   FOLLOWUP PLANS:  Dr. Council Mechanic next week with lab work and Dr. Carlean Purl,  appointment on October 01, 2008.   SPECIAL INSTRUCTIONS:  Call if problems.   DIET:  Resume previous,  no added salt, and diabetic diet.   DISCHARGE MEDICINES:  1. Iron sulfate 325 mg one twice daily.  2. Norvasc 10 mg one-half daily.  3. Crestor 10 mg one every other day.  4. Diovan 320 mg 1/2 once a day.  5. Furosemide 40 mg one half once a day.  6. Toprol-XL 25 mg once a day.  7. Avandia 4 mg one-half b.i.d.  8. Nitroglycerin ointment 2% 1/2-inch 8 in the morning, remove 8:00      p.m.  9. Protonix 40 mg one daily.   Stop taking aspirin, ibuprofen, and  Aleve.   The patient was complaining of being over medicated, so I reduce some of  his meds as above.      Evie Lacks. Plotnikov, MD  Electronically Signed     AVP/MEDQ  D:  09/07/2008  T:  09/07/2008  Job:  [id number]   cc:   Gatha Mayer, MD,FACG  Carlena Bjornstad, MD, [facility]  Modesto Charon, MD

## 2011-04-01 NOTE — Consult Note (Signed)
NAMENEFTALI, HARTLING                ACCOUNT NO.:  [id number]   MEDICAL RECORD NO.:  [id number]          PATIENT TYPE:  INP   LOCATION:  [id number]                         FACILITY:  [facility]   PHYSICIAN:  Lanelle Bal, MD    DATE OF BIRTH:  1932-09-08   DATE OF CONSULTATION:  09/01/2005  DATE OF DISCHARGE:                                   CONSULTATION   REQUESTING PHYSICIAN:  Dr. Wilhemina Cash.   FOLLOW-UP CARDIOLOGIST:  Dr. [name].   PRIMARY CARE PHYSICIAN:  Dr. Council Mechanic.   REASON FOR CONSULTATION:  Recurrent coronary occlusive disease.   HISTORY OF PRESENT ILLNESS:  Patient is a 77 year old male who approximately  2 weeks ago started having sternal, mid-chest aching while on his treadmill.  He noted that this progressed until he was having pain after approximately 7  minutes on the treadmill and when he went to water aerobics after walking  approximately 1000 feet.  Pain was relieved with rest.  A stress test was  done yesterday and the patient was admitted to the hospital for cardiac  catheterization which was done today.  He has had no history of acute  myocardial infarction.  He does have a history of hypertension, a history of  hyperlipidemia currently on Zocor.  Was newly diagnosed with diabetes  approximately 3 years ago.  He does not know his hemoglobin A1c.  He has  been a smoker in the past but quit 40 years ago.   FAMILY HISTORY:  His mother died of congestive heart failure, by renal  failure and diabetes, pacemaker.  His father died at age 23 of myocardial  infarction.  He has one sister who had a myocardial infarction at age 47.   PAST MEDICAL PROBLEMS:  1.  Diabetic neuropathy.  2.  Sleep apnea.  3.  Macular degeneration.   PREVIOUS SURGERIES:  Coronary artery bypass grafting x3 done in June of 1995  with a sequential reverse saphenous vein graft to the posterior descending  posterolateral branches of the right and reverse saphenous vein graft to the  first OM.  Until the  last 2 weeks, he has had no problems.   SOCIAL HISTORY:  Patient is married.  He is retired Nature conservation officer.  He smoked 1-  1/2 packs a day for 8 or 9 years and quit 40 years ago.  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   FAMILY HISTORY:  Maternal grandfather died with gastric cancer.  Mother died  of congestive heart failure at age 80.  She had diabetes and renal failure  and a pacemaker implanted.  Father died at age 62, myocardial infarction.   MEDICATIONS AT THE TIME OF ADMISSION:  Medications are incorrectly recorded  on the home medication list.  According to the patient he is on atenolol 50  mg in the morning, 25 mg in the evening, Cardizem 360 mg daily.  He is  unsure of the dose of hydrochlorothiazide or Diovan which he takes.  Avandia  is 8 mg daily.  He is also  on fish oil twice a day, multivitamin, 325 mg of  aspirin a day.  He recently stopped Lantus 30 mg a day 2 weeks ago and also  stopped Glucophage 2 weeks ago because of rash.  He is also on prednisone  between 4 and 6 mg which is self-administered on a week and then off 2 weeks  or 3 weeks, and then on a week.  He has been doing this for at least 3 to 6  months.   DRUG ALLERGIES:  The patient has significant rash -- it is unclear whether  this eczema or drug reaction.   REVIEW OF SYSTEMS:  CARDIAC:  Chest pain.  He denies shortness of breath,  exertional shortness of breath, orthopnea, presyncope, syncope, palpitation  or lower extremity edema.  GENERAL:  Patient denies any constitutional  symptoms.  RESPIRATORY:  Denies hemoptysis or shortness of breath.  GASTROINTESTINAL:  Has had a history of GI bleeds in the past with  diverticular disease diagnosed in the past, but has had no GI bleed for the  past 2 years.  NEUROLOGIC:  Has burning in his feet when he gets up in the  morning thought to be related to neuropathy.  Denies significant arthritis.  GU:  Denies blood  in his urine.  Denies any recent infections.  HEMATOLOGIC:  Denies easy bruisability or other blood dyscrasias though he is on aspirin  and now on heparin.  ENDOCRINE:  A history of diabetes.  PSYCHIATRIC:  Denies psychiatric history.  NEUROLOGIC:  Denies claudication.  Denies  __________, but has had cerebrovascular disease in the past.   PHYSICAL EXAMINATION:  VITAL SIGNS:  Patient's blood pressure is 109/54,  pulse is 54, respiratory rate is 18, O2 sat is 96%.  He is 5 feet 9 inches  tall, weighs 188 pounds.  GENERAL:  Patient is awake, alert, neurologically intact without chest pain.  HEENT:  Pupils equal, round and reactive to light.  NECK:  Neck is with a right carotid bruit, none on the left.  LUNGS:  Lungs have distant breath sounds.  He has an increased barrel chest.  HEART:  There are no cardiac murmurs appreciated.  ABDOMEN:  Abdominal exam is benign without palpable masses, it is moderate  obese.  EXTREMITIES:  Veins have been previously harvested from the right leg, ankle  to knee.  Appears to have adequate vein for bypass in the left leg and  probably in the right upper leg.  The right groin site of the  catheterization is without hematoma.  He has +1 DP and PT pulses  bilaterally.   LABORATORY FINDINGS:  Creatinine 1.4, BUN of 27.  INR is 1.2.  Hematocrit is  34, white count 8.3, platelet count 188,000.  Chest x-ray shows bibasilar  scarring and atelectasis but without lung mass.  EKG shows right bundle  branch and left anterior fascicular block.   Carotid Doppler studies have been performed today, but the results are not  in the chart.   Cardiac catheterization performed which shows preserved LV function with  trace mitral regurgitation.  He has a 70% proximal LAD lesion involving a  small diagonal.  The circ system is a small non-dominant system that may not  be bypassable.  The right coronary artery is now totally occluded with filling of the PD PL system through a  vein graft to the right -- to the PD.  Just before the anastomosis to the PD, there is a 90% stenosis.  The  sequential limb of the vein graft is not visualized.   IMPRESSION:  With progression of vein disease and also with native disease,  redo coronary artery bypass grafting is recommended to the patient.  The  risks of redo surgery including death, infection, stroke, myocardial  infarction, bleeding, blood transfusion have all been discussed with the  patient in detail and he is willing to proceed.  With the patient's critical  anatomy, it is likely that he should not be discharged home.  We will change  next week's OR  schedule to accommodate him at an earlier time, tentatively, Tuesday,  September 06, 2005.  We will avoid Diovan during that period to decrease the  chance of postoperative renal insufficiency.  We will need to watch and  maintain good control of his diabetes in the perioperative period.      Lanelle Bal, MD  Electronically Signed     EG/MEDQ  D:  09/01/2005  T:  09/01/2005  Job:  [id number]   cc:   Teresa Pelton, M.D.  Fax: [phone]   Scarlett Presto, M.D.  Fax: [phone]   Dola Argyle, M.D.  [address] [place]  [place] [address]

## 2011-04-01 NOTE — Op Note (Signed)
[name], [name]                ACCOUNT NO.:  [id number]   MEDICAL RECORD NO.:  [id number]          PATIENT TYPE:  INP   LOCATION:  2303                         FACILITY:  [facility]   PHYSICIAN:  Lanelle Bal, MD    DATE OF BIRTH:  1932-10-11   DATE OF PROCEDURE:  09/06/2005  DATE OF DISCHARGE:                                 OPERATIVE REPORT   PREOPERATIVE DIAGNOSIS:  Coronary occlusive disease.   POSTOPERATIVE DIAGNOSIS:  Coronary occlusive disease.   SURGICAL PROCEDURE:  Redo coronary artery bypass grafting x3, with left  internal mammary artery to left anterior descending coronary artery, reverse  saphenous vein graft to the diagonal coronary artery, reverse saphenous vein  graft to the posterior descending coronary artery, with right thigh Endo  vein harvesting.   SURGEON:  Lanelle Bal, MD.   FIRST ASSISTANT:  Jacinta Shoe, P.A.   BRIEF HISTORY:  The patient is a 77 year old male with diabetes who 10 years  previously had undergone coronary artery bypass grafting x3, with a  sequential vein graft to the PD and PL and a reverse saphenous vein graft to  the distal circumflex.  At that time, had had no disease of the LAD and  diagonal.  He presented with unstable angina, and repeat cardiac  catheterization showed marked progression of proximal LAD and diagonal  disease of greater than 80%, with a complex lesion not suitable for  angioplasty.  The circumflex system is very small.  The right coronary  artery is occluded.  The right vein graft is patent to the PD with a 90%  stenosis just before the anastomosis.  The segment between the PD and PL  branch is occluded.  The PD backfills into the PL branch.  Overall  ventricular function was preserved.  Because of the progression of both  native disease and vein graft disease on the right, coronary artery bypass  grafting was recommended.  The patient agreed and signed informed consent.  He was noted to have baseline  slight elevation of his creatinine.   DESCRIPTION OF THE PROCEDURE:  With Swan-Ganz and arterial line monitors in  place, the patient underwent general endotracheal anesthesia without  incident.  The skin of the chest and legs was prepped with Betadine and  draped in the usual sterile manner.  Using the [employer] vein harvesting  system, vein was harvested from the right thigh.  It was of good quality and  caliber.  Using a sagittal saw, a median sternotomy was performed.  The  underlying tissue was dissected off the posterior table of the sternum.  The  left internal mammary artery was dissected down as a pedicle graft.  The  distal artery was divided and had  good free flow.  Pericardium was opened.  The dissection of the inferior surface of the heart and the right atrium and  ascending aorta was carried out to the extent to allow cannulation.  The  patient was systemically heparinized.  The ascending aorta was cannulated.  A retrograde cardioplegia catheter was placed through a separate pursestring  in the right atrial  wall.  A single right atrial venous cannula was placed.  The patient was placed on cardiopulmonary bypass, 2.4 liters/minute/sq m.  The remainder of the myocardium was dissected out.  The distal circumflex  was identified and was extremely small and not bypassable.  The PD was a  small vessel, 1.3-1.4 mm in size and diffusely diseased.  The PL branch was  very small, less than 1 mm.  The diagonal was a reasonable size vessel,  approximately 1.3-1.4 cm in size.  LAD was intramyocardial in the proximal  two-thirds of the vessel.  The distal third was approximately 1.3-1.4 mm in  size distally.  Aortic cross-clamp was applied, and 500 cc of cold blood  potassium cardioplegia was administered antegrade through aortic root vent  cardioplegia needle.  Additional cold blood cardioplegia was administered  retrograde.  Attention was turned first to the PD, which was opened,  and  distal anastomosis was performed with a running 7-0 Prolene.  Additional  cold blood cardioplegia was administered down the vein graft.  Attention was  then turned to the diagonal coronary artery which was opened and admitted a  1.5 mm probe.  The vessel was approximately 1.4 mm in size.  Using a running  7-0 Prolene, distal anastomosis was performed.  Attention was then turned to  the left anterior descending coronary artery, which was opened in the distal  third.  Using a running 8-0 Prolene, the left internal mammary artery was  anastomosed to the left anterior descending coronary artery.  Attention was  then turned to the ascending aorta.  The aortic root vent cardioplegia  needle was removed.  Punch aortotomies were created in the hoods of each of  the vein grafts.  The two vein grafts were noted and anastomosed to the  ascending aorta.  Air was evacuated from the grafts.  The heart was allowed  to passively fill and de-air.  The aortic cross-clamp was removed.  Total  cross-clamp time was 64 minutes.  Sites of anastomosis were inspected and  free of bleeding.  Atrial and ventricular pacing wires were applied.  The  patient was then ventilated and weaned from cardiopulmonary bypass without  difficulty.  He remained hemodynamically stable.  He was decannulated in the  usual fashion.  Protamine sulfate was administered, with the operative field  hemostatic.  Two atrial and two ventricular pacing wires were applied.  A  left pleural tube and a single Blake substernal drain were placed.  Sternum  was closed with #6 stainless steel wire.  Fascia was closed with interrupted  0 Vicryl, running 3-0 Vicryl in the subcutaneous tissue, 4-0 subcuticular  stitch in the skin edges.  Dry dressings were applied.  Sponge and needle  count was reported as correct at the completion of the procedure.  The patient tolerated the procedure without obvious complication and was  transferred to the  surgical intensive care unit for further postoperative  care.  Because of a low hematocrit starting the case, the patient did  require a blood transfusion for low hematocrit.      Lanelle Bal, MD  Electronically Signed     EG/MEDQ  D:  09/06/2005  T:  09/06/2005  Job:  [id number]   cc:   Thomas C. Wall, M.D.  [address] [place]  [place] [address]

## 2011-04-01 NOTE — Letter (Signed)
February 08, 2007    Timothy E. Rosana Hoes, M.D.  [id number], [place] [address]   RE:  [name], [name]  MRN:  [id number]  /  DOB:  [birth year]   Dear Dr. Rosana Hoes:   This is to introduce [name], a 77 year old white male who I am  sending for right sided flank and abdominal pain with an ultrasound  showing gallstones.  I did a CT scan first because of the uniqueness of  the discomfort.  It was basically normal, except for suspected  gallstones.  The patient is reasonably stable today with minimal  discomfort.  He had three days of significant and intense right upper  quadrant pain.   PAST MEDICAL HISTORY:  Significant for:  1. Hypertension of long-standing.  2. Elevated cholesterol for many years.  3. Coronary artery disease, status post CABG with 3-vessel bypass in      1995.  4. Sleep apnea.  5. Diabetes since November 2003.  6. Diverticulitis since March 2000.  7. Erectile dysfunction since June 2005.  8. He had a carotid ultrasound in October 2006, that showed a 40-60%      bilateral ICA stenoses.  9. A colonoscopy was most recently done in 2000, showing diverticula      extensively throughout with two polyps that were benign and      internal and external hemorrhoids.   MEDICATIONS:  1. Avandia 4 mg b.i.d.  2. Insulin, Lantus, 25 units a day.  3. Toprol XL 25 mg a day.  4. Zocor 20 mg a day.  5. Lasix 20 mg a day.  6. Niaspan 500 mg a day.  7. Crestor 10 mg a day.  8. Cardizem 360 mg a day.  9. Norvasc 5 mg a day.  10.He also uses Viagra occasionally.   His A1c most recently was 6.5 in January 2008.  Microalbumin was 20.4,  as a ratio in June 2007.   PHYSICAL EXAMINATION:  GENERAL:  Today was basically very benign.  ABDOMEN:  Soft.   As I said, his ultrasound showed stones and I am referring him therefore  for you to evaluate him for possible cholecystectomy.  I appreciate your  seeing the patient and look forward to your evaluation.     Sincerely,      Modesto Charon, MD  Electronically Signed    RNS/MedQ  DD: 02/08/2007  DT: 02/08/2007  Job #: [id number]

## 2011-04-01 NOTE — Discharge Summary (Signed)
NAMEREMMY, BEERMAN                ACCOUNT NO.:  [id number]   MEDICAL RECORD NO.:  [id number]          PATIENT TYPE:  INP   LOCATION:  2016                         FACILITY:  [facility]   PHYSICIAN:  Lanelle Bal, MD    DATE OF BIRTH:  [birth year]   DATE OF ADMISSION:  08/31/2005  DATE OF DISCHARGE:  09/09/2005                                 DISCHARGE SUMMARY   ADMISSION DIAGNOSIS:  Chest pain with history of coronary artery disease  status post coronary artery bypass grafting.   DISCHARGE DIAGNOSIS:  1.  Recurrent coronary artery occlusive disease status post redo coronary      artery bypass grafting.  2.  Diabetes mellitus type 2.  3.  Obstructive sleep apnea.  4.  Macular degeneration.  5.  Hypertension.  6.  Hyperlipidemia.  7.  Carotid artery disease status post right carotid endarterectomy in      February 1994.  8.  Diverticulosis.  9.  Questionable eczema.   ALLERGIES:  No known drug allergies, however, Glucophage and Lantus insulin  recently discontinued by Dr. Council Mechanic secondary to a rash (believed eczema  versus drug reaction).   PROCEDURE:  1.  September 06, 2005, redo coronary artery bypass grafting x 3 using the      left internal mammary artery to the left anterior descending, reversed      saphenous vein graft to the diagonal coronary artery, reversed saphenous      vein graft to the posterior descending coronary artery with right thigh      endovein harvesting, surgeon Dr. Lanelle Bal.  2.  Cardiac catheterization, September 01, 2005, by Dr. Scarlett Presto,      showing severe native three vessel coronary artery disease, well      preserved left ventricular systolic function with 1+ mitral      regurgitation, nonobstructive disease in the left subclavian and left      common carotid.  3.  Carotid duplex, September 01, 2005, showing 40-60% internal carotid artery      stenosis bilaterally, vertebral artery flow antegrade.  4.  September 01, 2005, ankle brachial  indices greater than 1 on the right and      0.90 on the left.   BRIEF HISTORY:  Mr. Ganguly is a 77 year old Caucasian male with a history of  diabetes x 10 years who has undergone coronary artery bypass grafting x 3  with sequential vein graft to the PD and PL and reversed saphenous vein  graft to the distal circumflex.  This was done in 1995.  At that time, he  had no disease of the LAD and diagonal.  Approximately two weeks prior to  admission, he started having sternal and mid chest aching while on his  treadmill.  He noted that this progressed until he was having pain after  approximately seven minutes on the treadmill and when he went to water  aerobics after walking approximately 1000 feet.  The pain was relieved with  rest.  A stress test was done at Dr. [name] office on October 18 and he had a  very positive  response with ST segment depression inferolaterally associated  with chest tightness and burning.  He was subsequently admitted to [facility] for telemetry and anticipated cardiac catheterization.   HOSPITAL COURSE:  On August 31, 2005, Mr. Conliffe was admitted to [facility] after having a positive stress test.  He was started on IV heparin  except for cardiac catheterization which was performed on September 01, 2005,  showing recurrent coronary occlusive disease.  Subsequently, Dr. Gwendolyn Lima was consulted regarding redo coronary artery bypass grafting  surgery.  After examining the patient and review of his catheterization  findings, [name] did feel that redo coronary artery bypass graft  surgery was the best treatment option.  With the patient's critical anatomy,  it was felt that he should not be discharged home until undergoing surgery.  Therefore, surgery was tentatively scheduled for October 24.  In the  meantime, he was maintained on IV heparin and underwent pre-CABG Dopplers as  discussed above.  On October 24, Mr. Bodenschatz was taken to the  operating room  as anticipated.  There was no interoperative complications and he was  transferred to the surgical intensive care unit in stable condition.  At the  time of this dictation, his postoperative course has remained uneventful.  He remained in the intensive care unit through postoperative day one and  then was transferred to telemetry unit 2000.  By this time, all his invasive  monitoring lines and drainage tubes had been discontinued without incident.  He was off his insulin drip and back on his home regimen of Avandia 4 mg  b.i.d. as well as NovoLog insulin sliding scale.  He was neurologically  intact post extubation and was maintaining sinus rhythm.  While on unit  2000, Mr. Ladzinski made good progression.  He was weaned from supplemental  oxygen and was able to ambulate in the hallways independently.  He was  afebrile and, again, maintained normal sinus rhythm.  His blood pressure  remained stable primarily with a systolic ranging in the [id number] with  diastolics in the 0000000 to [id number] range.  He was able to tolerate a regular diet  and pain was controlled on oral pain medication.  He did complain of a  pruritic rash along his hands, upper arms, and back, which he says has been  chronic for approximately two years and is followed by Dr. Nevada Crane.  It was  unclear whether this was related to eczema or possible previous drug  reaction.  He had been on Medrol in the past for the rash but due to his  recent surgery, he was, instead, treated with Benadryl.  Mr. Moriarity  incisions remained clean, dry, and intact without signs of infection.  He  did have some mild fluid volume excess with a weight of approximately 11  pounds from his preop which was treated with diuretic therapy.  He did have  some mild dyspnea on exertion associated with this but was without chest  pain.  Follow up chest x-ray after chest tube removal showed no pneumothorax with stable left basilar atelectasis and small  effusions.  On exam, his  heart had a regular rate and rhythm without murmur.  Lungs were clear.  Abdominal exam was soft, nontender, nondistended, but with hypoactive bowel  sounds.  Extremities showed 1-2+ edema, more pronounced on the right leg  than on the left, which was felt mostly secondary to fluid volume excess.  Initial postoperative blood sugars did run as  high as 200 and was treated  with increasing his NovoLog insulin sliding scale.  If his sugars do not  improve, would consider restarting him on Lantus insulin.  If there are no  significant changes in Mr. Seidman's status, it is anticipated that he will  be ready for discharge home by postoperative day three, September 09, 2005.  The plans will be to discontinue his chest tube suture and external pacing  wires prior to discharge.   The most recent labs show a white blood cell count 10.5, hemoglobin 10.5,  hematocrit 30.5, platelet count 103.  Sodium 134, potassium 4.1, chloride  101, CO2 29, BUN 18, creatinine 1.3, blood glucose 164.  Hemoglobin A1C 6.7.   DISCHARGE MEDICATIONS:  Aspirin 325 mg p.o. daily, Toprol XL 25 mg p.o.  daily, Zocor 20 mg p.o. daily, Avandia 4 mg p.o. b.i.d., multi-vitamin p.o.  daily, fish oil capsules b.i.d., Niaspan 500 mg p.o. q.h.s., Diovan 160 mg  daily, Lasix 40 mg daily x 7 days, K-Dur 20 mEq daily x 7 days, Tylox 1-2  tablets p.o. q.4h. p.r.n. pain.   DISCHARGE INSTRUCTIONS:  He is instructed to avoid driving or heavy lifting  more than 10 pounds.  He is encouraged to continue daily breathing exercises  and daily walking.  He is to notify the CVTS office if he develops fever  greater than 101, redness or drainage from his incision sites.  He may  shower and clean his incisions daily with mild soap and water.  He is to  continue a low salt, low fat, carbohydrate modified diet.  He is to call and  schedule a  two week follow up with chest x-ray with Dr. [name].  He is to follow up with  Dr.  Lanelle Bal in the [place] office in approximately three weeks, the  CVTS office will contact him regarding a specific appointment, date, and  time.      Jacinta Shoe, P.A.      Lanelle Bal, MD  Electronically Signed    AWZ/MEDQ  D:  09/08/2005  T:  09/09/2005  Job:  [id number]   cc:   Dola Argyle, M.D.  [address]  [address]  [place]  [place] [address]   Teresa Pelton, M.D.  Fax: [phone]

## 2011-04-01 NOTE — Assessment & Plan Note (Signed)
[facility]                            CARDIOLOGY OFFICE NOTE   RAFE, GOVONI                       MRN:          [id number]  DATE:02/14/2007                            DOB:          January 15, 1932    This morning will want to fax a copy over to Dr. Annabell Sabal of  [facility], [phone].   [name] is seen for cardiology followup today. Also he is seen for  clearance for gallbladder surgery. He has not been having any chest  pain. His fluid status is under better control. His blood pressure has  been higher than baseline. He had been having skin problems and it was  thought that some of this might be due to medicines. He has had allergy  testing by Dr. Caprice Red and he has allergies to dust mites. He is  doing much better with his bedroom cleared of dust. Therefore we are  free to try to use some of the medicines that he had previously.   The patient has been having gallbladder symptoms and needs gallbladder  surgery. I am asked to see if he is stable for this.   He is not having muscle aches and pain. We will need to readjust his  statin in the future. He has coronary disease post redo CABG and he has  been stable.   PAST MEDICAL HISTORY:   ALLERGIES:  No known drug allergies.   MEDICATIONS:  As of today, he is on:  1. Avandia 4 b.i.d.  2. Lantus 25.  3. Fish oil.  4. Multivitamin.  5. Aspirin.  6. Toprol XL 25 daily.  7. Lasix 20 mg daily.  8. Crestor 10.  9. Norvasc 5.  10.Cardizem 360 (to be held as Diovan is restarted).   OTHER MEDICAL PROBLEMS:  See the list below.   REVIEW OF SYSTEMS:  His skin lesions and eczema and itching is greatly  improved. He is feeling well in general. His review of systems is  negative.   PHYSICAL EXAMINATION:  VITAL SIGNS:  Weight is 198 pounds. Blood  pressure today is 176/78 with a pulse of 61.  GENERAL:  The patient is oriented to person, time and place. Affect is  normal.  HEENT:  He has no xanthelasma. There is normal extraocular motion. There  is a soft right carotid bruit. There is no jugular venous distention.  LUNGS:  Clear. Respiratory effort is not labored.  CARDIAC:  Reveals an S1 with an S2. There is a 2/6 systolic murmur.  ABDOMEN:  Obese but soft. There are no masses or bruits. He has no  significant peripheral edema. There are no major musculoskeletal  deformities.   The patient did have carotid Dopplers on October 04, 2006. He has a 40-  59% right internal carotid lesion and 0-39% left narrowing. EKG today  reveals old right bundle branch block with old left anterior fascicular  block. He had sinus rhythm with first degree AV block.   PROBLEM LIST:  1. History of a diverticular bleed.  2. GERD.  3. Carotid artery  disease. See the results above concerning the      followup Dopplers and this is stable.  4. Hypertension. His blood pressure is somewhat higher than it had      been. I would like to have him back on his ARB. He had had systolic      hypertension most recently and Norvasc had been added to his      medications. We will restart Diovan 160 and put his Cardizem on      hold and follow his blood pressure.  5. Right bundle branch block by history.  6. History of macular degeneration.  7. History of CABG in 1995 with redo in October 2006. I believe he is      stable. His exercise capacity is adequate. Further workup is not      needed to clear him for surgery.  8. History of good LV function.  9. Sleep apnea.  10.Significant skin problems with allergies to dust mites.  11.HDL. We will consider Niaspan in the future.  12.Peripheral edema. This is significantly improved.  13.Mild increase in his BUN for a period of time and then this      stabilized.  14.Need for gallbladder surgery. The patient is cleared for      gallbladder surgery.     Carlena Bjornstad, MD, [facility]  Electronically Signed    JDK/MedQ  DD: 02/14/2007   DT: 02/14/2007  Job #: [id number]

## 2011-04-01 NOTE — H&P (Signed)
NAME:  Jeremiah, [name]                ACCOUNT NO.:  [id number]   MEDICAL RECORD NO.:  [id number]            PATIENT TYPE:   LOCATION:                                 FACILITY:   PHYSICIAN:  Thomas C. Wall, M.D.        DATE OF BIRTH:   DATE OF ADMISSION:  08/31/2005  DATE OF DISCHARGE:                                HISTORY & PHYSICAL   [name] came today for an exercise stress test and had a very positive  response with ST segment depression inferolaterally and chest tightness and  burning.   HISTORY OF PRESENT ILLNESS:  He is a very pleasant 77 year old gentleman  with known coronary disease, status post coronary artery bypass surgery  in  1995.  He has preserved LV function with Cardiolite August 18, 2003.  At  that time he did have some ST segment changes in the lateral leads which  were felt to be nondiagnostic and he does have baseline LVH at baseline.  His EF was 71% and he did have some mild ischemia inferolateral wall at the  base and mid ventricular level.  Symptoms he has had have been recent onset  and Dr. Teresa Pelton set him up for the stress test.  They are very  reminiscent of the symptoms that he had prior to his CABG.  The symptoms  were reproduced today with his stress test.  He only achieved seven minutes  and exercised for five minutes and 28 seconds.  Heart rate peak was 93 with  60% or predicted maximum heart rate.  Test was stopped secondary to EKG  changes as well as chest discomfort.  He has a baseline right bundle and  left anterior vesicular block.   PAST MEDICAL HISTORY:  In addition to the above:  1.  Type 2 diabetes.  2.  Hypertension.  3.  Hyperlipidemia.  4.  Obstructive sleep apnea.   CURRENT MEDICATIONS:  1.  Diovan 160 daily.  2.  Niaspan 500 mg daily.  3.  Glucophage 1000 mg daily.  4.  Fish oil two daily.  5.  Cardizem 360 mg a day.  6.  Atenolol 50 mg in the morning and 25 in the evening.  7.  Hydrochlorothiazide 25 mg a day.  8.   Aspirin 325 a day.  9.  Multivitamin daily.  10. Insulin as directed.  11. Zocor 20 mg a day.   ALLERGIES:  He has a questionable allergy to ORAL DIABETIC MEDICINE, UNKNOWN  NAME.   He has history of bilateral carotid bruits.  He has had a right carotid  endarterectomy in February of 1994.  He has a question of SVT listed in the  chart.   At the time of his surgery, May 09, 1994, he had a vein graft to the PDA  and posterolateral vein graft to an OM.   Last carotid Dopplers were in 2004, which showed nonobstructive plaque.   FAMILY HISTORY:  Please refer to the chart.   SOCIAL HISTORY:  Please refer to the previous chart.   REVIEW OF SYSTEMS:  Noncontributory other than the above.  He denies any  reflux symptoms, history of peptic ulcer disease, bleeding diaphysis or  difficulty swallowing.   PHYSICAL EXAMINATION:  VITAL SIGNS:  Blood pressure is 147/62, pulse 55 in  sinus bradycardia, weight 188 pounds.  HEENT:  Sclerae are clear.  Pupils are equal, round, reactive to light and  accommodation.  Extraocular movements are intact.  Facial symmetry is  normal.  Dentition is unremarkable.  NECK:  Carotid upstrokes are equal bilaterally with soft systolic sounds  bilaterally.  There is no thyromegaly.  Trachea is midline.  LUNGS:  Clear.  Sternotomy site is stable.  CARDIOVASCULAR:  S1 and S2 normal without murmurs, rubs, or gallops.  ABDOMEN:  Protuberant.  Good bowel sounds.  There is no midline bruit.  There is no tenderness.  There is no hepatosplenomegaly.  EXTREMITIES:  No clubbing, cyanosis, or edema.  NEUROLOGIC:  Intact.   ASSESSMENT:  Recurrent chest discomfort and chest burning reminiscent of his  __________ and is a diabetic and is over 11 years out from his bypass  surgery.  He has early positive stress test with symptoms.  Nuclear imaging  could not be accomplished.  Other problems listed above.   PLAN:  1.  Admit to [employer] [facility] on  telemetry.  2.  Cardiac catheterization with left cores and grafts tomorrow.  3.  Intravenous heparin.  4.  Continue other medications.   I have discussed the above with the patient.  He agrees with the plan and  agrees to proceed.  Will obtain __________.      Thomas C. Wall, M.D.  Electronically Signed     TCW/MEDQ  D:  08/31/2005  T:  08/31/2005  Job:  [id number]   cc:   Teresa Pelton, M.D.  Fax: [phone]   Dola Argyle, M.D.  [address] [place]  [place] [address]

## 2011-04-01 NOTE — Assessment & Plan Note (Signed)
[facility]                              CARDIOLOGY OFFICE NOTE   [name], [name]                       MRN:          [id number]  DATE:10/02/2006                            DOB:          February 02, 1932    [name] is seen for cardiology followup.  He is doing relatively well.  He has had muscle aches and pains on Zocor.  When he stopped the Zocor the  pains were better.  We then tried Pravachol.  He had return of some aches  and pains.  The Pravachol has been on hold but his muscle aches and pains  have not improved.  Therefore, it is very hard to know for sure if it is  from the Statin.  We will put him back on 40 of Pravachol and follow him  carefully.   He has not have any syncope of presyncope.  There is no shortness of breath.  After dancing one evening, he did have some slight chest discomfort.  This  resolved and he has had no recurrence.  This will be kept in mind as he is  followed.   PAST MEDICAL HISTORY:  Allergies:  No known drug allergies.   MEDICATIONS:  1. Avandia 4 mg b.i.d.  2. Lantus, Diovan, fish oil, multivitamin, aspirin  3. Toprol XL 25.  4. Lasix 20 daily.  5. Viagra p.r.n. (the patient knows extremely well that he must never take      nitroglycerine around a time of using Viagra).   OTHER MEDICAL PROBLEMS.:  See the list below.   REVIEW OF SYSTEMS:  See the HPI.  He did ask me about the continued use of  Avandia.  We had a very careful discussion and I agree with continuing the  medication.  The patient had mentioned that Dr. Council Mechanic would like to  continue it and I agree also.  He gets a good response with his hemoglobin  A1c and this is the most important issue at this point.  Otherwise his  review of systems is negative.   PHYSICAL EXAMINATION:  His weight is 199.  This is up 4 pounds since his  last visit.  Blood pressure is 150/86.  This is actually a good blood  pressure for him but tighter control clearly  would be better.  His pulse is  64.  The patient is oriented to person, time and place.  Affect is normal.  No xanthelasma.  He has normal extraocular motion.  There is a soft carotid  bruit on the left and the right.  We had planned for some Dopplers in the  past year but he was not able to be present for that appointment and we will  reschedule this.  LUNGS:  Clear.  Respiratory effort is not labored.  CARDIAC EXAM:  Reveals an S1 and S2.  He does have a soft systolic murmur.  ABDOMEN:  Soft.  There are no masses or bruits.  He has no peripheral  edema.  There are no musculoskeletal deformities.   PROBLEMS:  1. History of diverticular bleed.  2. Gastroesophageal reflux disease.  3. Carotid disease and he will get his followup appointment for his      Dopplers.  4. Hypertension, treated, but he needs tighter control.  5. Right bundle branch block by history.  6. History of macular degeneration.  7. History of CABG in 1995 and a re-do in October of 2006.  He has had      some very slight discomfort.  I will not change the approach to his      care at this time.  8. Good LV function.  9. History of sleep apnea.  10.Question of allergy to some oral diabetic medications.  11.Low HDL.  We will have to consider trying niacin in the future.  12.History of diverticulitis.  13.Peripheral  edema, improved.  14.Mild increase in his BUN and this is stabilized.   We will see him back in 6 months for followup.     Jeremiah Bjornstad, MD, [facility]  Electronically Signed    JDK/MedQ  DD: 10/02/2006  DT: 10/02/2006  Job #: [id number]   cc:   Modesto Charon, MD

## 2011-04-01 NOTE — Assessment & Plan Note (Signed)
[facility]                              CARDIOLOGY OFFICE NOTE   JOANNA, DOREY                       MRN:          [id number]  DATE:07/31/2006                            DOB:          Sep 17, 1932    HISTORY OF PRESENT ILLNESS:  Mr. Arquilla is doing well and feeling well.  His  blood pressure is somewhat elevated today.  He has gained some weight.  He  was having aching in his shoulders and his Zocor was stopped by Dr.  Council Mechanic.  The aching improved.  He is going to be re-challenged with  Pravachol.  He is not having any significant chest pain or shortness of  breath.  His peripheral edema is under good control.  We had done a follow-  up BMET on May 02, 2006.  BUN was 23 and creatinine 1.5.  This was good for  him.   PAST MEDICAL HISTORY:   ALLERGIES:  NO KNOWN DRUG ALLERGIES.   MEDICATIONS:  1. Avandia 4 mg twice a day.  2. Lantus 25.  3. Diovan 160 mg.  4. Fish oil.  5. Multivitamin.  6. Aspirin.  7. Toprol XL 25 mg.  8. Lasix 20 mg.  9. Currently is on no Statin but Pravachol is going to be tried later this      month.   OTHER MEDICAL PROBLEMS:  See in my list of May 02, 2006.   REVIEW OF SYSTEMS:  He is feeling well and tells me of one very brief  episode of stinging anterior chest pain and this does not sound cardiac.  Otherwise, his review of systems is negative.   PHYSICAL EXAMINATION:  VITAL SIGNS:  Blood pressure today is 160/66.  The  systolic is higher than he usually has.  I have his full records of his  blood pressure at home and he does not have a pressure in this range  frequently.  Therefore, I will not change his medicine today.  GENERAL:  The patient is oriented to person, time, and place.  Affect is  normal.  LUNGS:  Clear.  Respiratory effort is not labored.  HEENT:  Reveals no xanthelasma.  He has normal extraocular motion.  He does  have some skin lesions that have been worked on by dermatology.  NECK:   There is question of a soft right carotid bruit.  His last Doppler  was done in February of 2007, and we will plan on repeating it in February.  CARDIAC:  Reveals an S1 with an S2.  There are no clicks or significant  murmurs.  ABDOMEN:  His abdomen is soft.  EXTREMITIES:  He has trace peripheral edema today.   LABORATORY DATA:  EKG reveals his old right bundle branch block.   PROBLEMS:  Problems are listed, numbers 1-14, on my note of May 02, 2006.  1. Coronary disease with redo coronary artery bypass graft, and he is      stable.  2. Mild peripheral edema, stable.  3. Mild increase in his BUN which is now improved.  4. Discomfort in his  shoulders that may be related to Zocor.  This      improved when the Zocor was stopped and Dr. Council Mechanic will be trying to      see if the patient can tolerate Pravachol.  When he was on high-dose      Zocor, his LDL had come down to 80.                               Carlena Bjornstad, MD, [facility]    JDK/MedQ  DD:  07/31/2006  DT:  08/01/2006  Job #:  [id number]   cc:   Modesto Charon, MD

## 2011-04-01 NOTE — Cardiovascular Report (Signed)
NAME:  [name], [name]                ACCOUNT NO.:  [id number]   MEDICAL RECORD NO.:  [id number]          PATIENT TYPE:  INP   LOCATION:  81                         FACILITY:  [facility]   PHYSICIAN:  Scarlett Presto, M.D.   DATE OF BIRTH:  1932-04-13   DATE OF PROCEDURE:  09/01/2005  DATE OF DISCHARGE:                              CARDIAC CATHETERIZATION   HISTORY OF PRESENT ILLNESS:  [name] is a 77 year old man with known  severe coronary artery disease, status post bypass surgery in 1995, where he  received a saphenous vein graft to his obtuse marginal and a saphenous vein  graft to his right coronary artery which is a jump from the PDA to the PLB.  He presented with ST and T wave changes and unstable angina and was referred  for heart catheterization.   PROCEDURES PERFORMED:  1.  Left Heart Catheterization  2.  Selective Coronary Angiography  3.  Saphenous Vein Graft Angiography  4.  Non-selective Left Internal Mammary Areriography  5.  Left Ventriculography   DESCRIPTION OF PROCEDURE:  After obtaining informed consent, the patient was  brought to cardiac catheterization laboratory in the fasting state.  There  he was prepped and draped in the usual sterile manner and local anesthetic  was obtained over the right groin using 1% lidocaine without epinephrine.  The right femoral artery was cannulated using the modified Seldinger  technique with a 6-French, 10-cm sheath.  Left heart catheterization was  performed using 6-French Judkins left #4 which seeded the left main coronary  artery.  A 6-French Judkins right #4 seeded the right coronary artery, the  saphenous vein graft to the obtuse marginal and the left subclavian artery.  A right coronary bypass which seeded the right coronary bypass and a pigtail  catheter which was used for left ventriculography in the 30 degree RAO view.  At the conclusion of the procedure, the catheters were removed.  The patient  was moved back to the  holding area where the femoral artery sheath was  removed.  Hemostasis was obtained using direct manual pressure.  At the  conclusion of the hold, there was no evidence of ecchymosis or hematoma  formation.  Distal pulses were intact.  Total fluoroscopic time was 11.4  minutes.  Total ionized contrast 150 mL.   RESULTS:  Aortic pressure 113/38 with a mean of 67 mmHg.   Left ventricular pressure 123XX123 with end-diastolic pressure of 14 mmHg.   Selective coronary angiography:  The left main coronary artery has a 40%  proximal lesion which appears to be hemodynamically significant with damping  at engagement.   The left anterior descending coronary artery is a moderate caliber vessel  which does not traverse the apex.  There is a 75% lesion in the proximal  portion of the artery just at the takeoff of the first diagonal.  The first  diagonal is a moderate caliber vessel which branches and has a 80% stenosis  in its proximal portion.   The circumflex coronary artery is a moderate caliber vessel which has two  obtuse marginals.  The first  obtuse marginal, which is ungrafted, has a 50%  proximal lesion.  The second obtuse marginal, which I believe is probably  the vessel that was grafted, has a complete occlusion just at its ostium.   The right coronary artery is occluded proximally and fills via the saphenous  vein graft to the right coronary artery.  It has diffuse disease throughout  which appears to be nonobstructive.  We can see only the most distal  portions of the vessel.   The saphenous vein graft to the obtuse marginal is occluded at its proximal  portion.   The saphenous vein graft to the right is patent in its ostium and body.  It  has a 95% anastomotic lesion on the posterior descending and the jump graft  to the posterior lateral branch is occluded.  It touches down in the  proximal portion of the posterior descending coronary artery.   The left subclavian artery has a 25%  proximal stenosis.  The left internal  mammary artery is widely patent and ungrafted and there is a proximal a 50%  ostial stenosis of the left common carotid.   The left ventriculogram reveals preserved LV systolic function with an  ejection fraction of 60%.  No wall motion abnormalities and 1+ mitral  regurgitation.   ASSESSMENT:  1.  Severe native three-vessel coronary disease.  2.  Patent ungrafted left internal mammary artery.  3.  The saphenous vein graft to the obtuse marginal occluded.  4.  Saphenous vein graft to the right coronary artery significant stenosis.  5.  Preserved left ventricular systolic function with 1+ mitral      regurgitation.  6.  Nonobstructive disease in the left subclavian and left common carotid.   PLAN:  To consult both the interventional cardiology group as well as the  surgeons to consider what would be best for this patient.  I favor surgical  revascularization.  I have discussed this case with Dr. Dola Argyle who  would like to get opinions both from the surgeons and the interventional  cardiologist as to how to pursue this case further.      Scarlett Presto, M.D.  Electronically Signed     JH/MEDQ  D:  09/01/2005  T:  09/01/2005  Job:  [id number]

## 2011-04-18 ENCOUNTER — Ambulatory Visit (INDEPENDENT_AMBULATORY_CARE_PROVIDER_SITE_OTHER): Payer: Medicare Other | Admitting: Family Medicine

## 2011-04-18 DIAGNOSIS — Z7901 Long term (current) use of anticoagulants: Secondary | ICD-10-CM

## 2011-04-18 DIAGNOSIS — I4891 Unspecified atrial fibrillation: Secondary | ICD-10-CM

## 2011-04-18 DIAGNOSIS — I4892 Unspecified atrial flutter: Secondary | ICD-10-CM

## 2011-04-18 LAB — POCT INR: INR: 2

## 2011-04-18 NOTE — Patient Instructions (Signed)
Continue current dose, check in 4 weeks  

## 2011-05-16 ENCOUNTER — Ambulatory Visit (INDEPENDENT_AMBULATORY_CARE_PROVIDER_SITE_OTHER): Payer: Medicare Other | Admitting: Family Medicine

## 2011-05-16 DIAGNOSIS — I4891 Unspecified atrial fibrillation: Secondary | ICD-10-CM

## 2011-05-16 DIAGNOSIS — I4892 Unspecified atrial flutter: Secondary | ICD-10-CM

## 2011-05-16 LAB — POCT INR: INR: 3

## 2011-05-16 NOTE — Patient Instructions (Signed)
Resume 7.5 mg daily, 5 mg Tue,Sat, recheck 4 weeks

## 2011-05-24 ENCOUNTER — Other Ambulatory Visit: Payer: Self-pay | Admitting: Family Medicine

## 2011-05-24 DIAGNOSIS — E119 Type 2 diabetes mellitus without complications: Secondary | ICD-10-CM

## 2011-05-25 ENCOUNTER — Other Ambulatory Visit (INDEPENDENT_AMBULATORY_CARE_PROVIDER_SITE_OTHER): Payer: Medicare Other | Admitting: Family Medicine

## 2011-05-25 DIAGNOSIS — E119 Type 2 diabetes mellitus without complications: Secondary | ICD-10-CM

## 2011-05-31 ENCOUNTER — Other Ambulatory Visit: Payer: Self-pay | Admitting: *Deleted

## 2011-05-31 ENCOUNTER — Encounter: Payer: Self-pay | Admitting: Family Medicine

## 2011-05-31 ENCOUNTER — Ambulatory Visit (INDEPENDENT_AMBULATORY_CARE_PROVIDER_SITE_OTHER): Payer: Medicare Other | Admitting: Family Medicine

## 2011-05-31 VITALS — BP 130/77 | HR 84 | Temp 98.7°F | Wt 206.0 lb

## 2011-05-31 DIAGNOSIS — E119 Type 2 diabetes mellitus without complications: Secondary | ICD-10-CM

## 2011-05-31 DIAGNOSIS — L989 Disorder of the skin and subcutaneous tissue, unspecified: Secondary | ICD-10-CM

## 2011-05-31 MED ORDER — WARFARIN SODIUM 7.5 MG PO TABS: 7.5000 mg | ORAL_TABLET | ORAL | Status: DC

## 2011-05-31 NOTE — Patient Instructions (Signed)
A1c in 3 months with OV a few days after that.  Glad to see you.  Take care.   Call Dr. Nevada Crane about getting an appointment.

## 2011-06-01 NOTE — Assessment & Plan Note (Signed)
Continue as is, no change in meds.  D/w pt about diet.  Recheck A1c 3 months.

## 2011-06-01 NOTE — Assessment & Plan Note (Signed)
He'll call Dr. Nevada Crane about follow up.

## 2011-06-01 NOTE — Progress Notes (Signed)
Diabetes:  Using medications without difficulties:yes Hypoglycemic episodes:no Hyperglycemic episodes:no Feet problems:no Blood Sugars averaging: 100-140 in AM, ~120 2-3 hours after meals Adjusting lantus daily as needed based on AM sugars.  Feeling well.  A1c reviewed.   Meds, vitals, and allergies reviewed.   ROS: See HPI.  Otherwise negative.    GEN: nad, alert and oriented HEENT: mucous membranes moist NECK: supple w/o LA CV: rrr. PULM: ctab, no inc wob ABD: soft, +bs EXT: no edema SKIN: no acute rash

## 2011-06-01 NOTE — Assessment & Plan Note (Signed)
Requesting records from Dr Posey Pronto

## 2011-06-02 ENCOUNTER — Encounter: Payer: Self-pay | Admitting: Family Medicine

## 2011-06-06 ENCOUNTER — Telehealth: Payer: Self-pay | Admitting: *Deleted

## 2011-06-06 NOTE — Telephone Encounter (Signed)
Will address the hard copy.

## 2011-06-06 NOTE — Telephone Encounter (Signed)
[facility] has faxed a form asking for clarification on coumadin script.  Form is on your desk.

## 2011-06-13 ENCOUNTER — Ambulatory Visit (INDEPENDENT_AMBULATORY_CARE_PROVIDER_SITE_OTHER): Payer: Medicare Other | Admitting: Family Medicine

## 2011-06-13 DIAGNOSIS — I4891 Unspecified atrial fibrillation: Secondary | ICD-10-CM

## 2011-06-13 DIAGNOSIS — I4892 Unspecified atrial flutter: Secondary | ICD-10-CM

## 2011-06-13 DIAGNOSIS — Z7901 Long term (current) use of anticoagulants: Secondary | ICD-10-CM

## 2011-06-13 DIAGNOSIS — Z5181 Encounter for therapeutic drug level monitoring: Secondary | ICD-10-CM

## 2011-06-13 LAB — POCT INR: INR: 2.4

## 2011-06-13 NOTE — Patient Instructions (Signed)
7.5 mg daily, 5 mg Tue,Sat, recheck 4 weeks

## 2011-07-19 ENCOUNTER — Ambulatory Visit (INDEPENDENT_AMBULATORY_CARE_PROVIDER_SITE_OTHER): Payer: Medicare Other | Admitting: Family Medicine

## 2011-07-19 DIAGNOSIS — I4892 Unspecified atrial flutter: Secondary | ICD-10-CM

## 2011-07-19 DIAGNOSIS — I4891 Unspecified atrial fibrillation: Secondary | ICD-10-CM

## 2011-07-19 LAB — POCT INR: INR: 2.4

## 2011-07-19 NOTE — Patient Instructions (Signed)
Continue 7.5 mg daily, 5 mg Tue,Sat, recheck 4 weeks

## 2011-08-02 ENCOUNTER — Telehealth: Payer: Self-pay | Admitting: Cardiology

## 2011-08-02 NOTE — Telephone Encounter (Signed)
Walk in Pt Form " Pt Dropped off Prescription Renewal Request" sent to Message Nurse  08/02/11/km

## 2011-08-03 ENCOUNTER — Other Ambulatory Visit: Payer: Self-pay | Admitting: *Deleted

## 2011-08-03 MED ORDER — METOPROLOL SUCCINATE ER 50 MG PO TB24: 50.0000 mg | ORAL_TABLET | Freq: Every day | ORAL | Status: DC

## 2011-08-03 MED ORDER — VALSARTAN 320 MG PO TABS: 320.0000 mg | ORAL_TABLET | Freq: Every day | ORAL | Status: DC

## 2011-08-15 ENCOUNTER — Ambulatory Visit: Payer: Medicare Other

## 2011-08-15 LAB — GLUCOSE, CAPILLARY
Glucose-Capillary: 105 — ABNORMAL HIGH
Glucose-Capillary: 110 — ABNORMAL HIGH
Glucose-Capillary: 118 — ABNORMAL HIGH
Glucose-Capillary: 124 — ABNORMAL HIGH
Glucose-Capillary: 124 — ABNORMAL HIGH
Glucose-Capillary: 126 — ABNORMAL HIGH
Glucose-Capillary: 127 — ABNORMAL HIGH
Glucose-Capillary: 130 — ABNORMAL HIGH
Glucose-Capillary: 130 — ABNORMAL HIGH
Glucose-Capillary: 132 — ABNORMAL HIGH
Glucose-Capillary: 135 — ABNORMAL HIGH
Glucose-Capillary: 138 — ABNORMAL HIGH
Glucose-Capillary: 138 — ABNORMAL HIGH
Glucose-Capillary: 145 — ABNORMAL HIGH
Glucose-Capillary: 158 — ABNORMAL HIGH
Glucose-Capillary: 159 — ABNORMAL HIGH
Glucose-Capillary: 174 — ABNORMAL HIGH
Glucose-Capillary: 176 — ABNORMAL HIGH
Glucose-Capillary: 190 — ABNORMAL HIGH

## 2011-08-15 LAB — CROSSMATCH
ABO/RH(D): O NEG
ABO/RH(D): O NEG
Antibody Screen: NEGATIVE
Antibody Screen: NEGATIVE

## 2011-08-15 LAB — CBC
HCT: 21.9 — ABNORMAL LOW
HCT: 31.3 — ABNORMAL LOW
Hemoglobin: 6.6 — CL
Hemoglobin: 7.7 — CL
Hemoglobin: 7.7 — CL
Hemoglobin: 8.3 — ABNORMAL LOW
Hemoglobin: 9.4 — ABNORMAL LOW
MCHC: 33.1
MCHC: 33.4
MCHC: 33.4
MCHC: 33.5
MCHC: 33.8
MCHC: 34
MCHC: 34.2
MCV: 89.2
MCV: 91.8
MCV: 91.8
MCV: 92.9
Platelets: 104 — ABNORMAL LOW
Platelets: 107 — ABNORMAL LOW
Platelets: 122 — ABNORMAL LOW
Platelets: 126 — ABNORMAL LOW
Platelets: 146 — ABNORMAL LOW
Platelets: 167
Platelets: 96 — ABNORMAL LOW
Platelets: 98 — ABNORMAL LOW
RBC: 2.38 — ABNORMAL LOW
RBC: 2.45 — ABNORMAL LOW
RBC: 2.47 — ABNORMAL LOW
RBC: 2.73 — ABNORMAL LOW
RBC: 2.74 — ABNORMAL LOW
RBC: 2.79 — ABNORMAL LOW
RBC: 3.33 — ABNORMAL LOW
RDW: 14.9
RDW: 15.2
RDW: 15.3
RDW: 15.4
RDW: 15.5
RDW: 15.5
RDW: 15.6 — ABNORMAL HIGH
WBC: 6.5
WBC: 7.1
WBC: 7.2
WBC: 8.2

## 2011-08-15 LAB — CARDIAC PANEL(CRET KIN+CKTOT+MB+TROPI)
CK, MB: 2.4
CK, MB: 2.8
CK, MB: 3.4
CK, MB: 3.6
Relative Index: 0.7
Relative Index: 1.3
Total CK: 123
Total CK: 243 — ABNORMAL HIGH
Troponin I: 0.15 — ABNORMAL HIGH
Troponin I: 0.15 — ABNORMAL HIGH
Troponin I: 0.22 — ABNORMAL HIGH
Troponin I: 0.37 — ABNORMAL HIGH

## 2011-08-15 LAB — COMPREHENSIVE METABOLIC PANEL
Alkaline Phosphatase: 30 — ABNORMAL LOW
Alkaline Phosphatase: 42
BUN: 23
BUN: 25 — ABNORMAL HIGH
CO2: 24
Calcium: 8.5
Chloride: 110
Creatinine, Ser: 1.29
GFR calc non Af Amer: 50 — ABNORMAL LOW
GFR calc non Af Amer: 54 — ABNORMAL LOW
Glucose, Bld: 123 — ABNORMAL HIGH
Glucose, Bld: 124 — ABNORMAL HIGH
Potassium: 4
Potassium: 4
Total Bilirubin: 0.7
Total Protein: 5.7 — ABNORMAL LOW

## 2011-08-15 LAB — HEMOGLOBIN AND HEMATOCRIT, BLOOD
HCT: 24.5 — ABNORMAL LOW
HCT: 24.6 — ABNORMAL LOW
HCT: 28.1 — ABNORMAL LOW
HCT: 28.4 — ABNORMAL LOW
Hemoglobin: 10.9 — ABNORMAL LOW
Hemoglobin: 8.3 — ABNORMAL LOW
Hemoglobin: 9.4 — ABNORMAL LOW
Hemoglobin: 9.7 — ABNORMAL LOW

## 2011-08-15 LAB — DIFFERENTIAL
Basophils Absolute: 0
Basophils Relative: 0
Eosinophils Absolute: 0.5
Lymphocytes Relative: 30
Monocytes Absolute: 0.8
Monocytes Relative: 8
Neutro Abs: 3.3
Neutro Abs: 4.8
Neutrophils Relative %: 56
Neutrophils Relative %: 56

## 2011-08-15 LAB — HEMOGLOBIN A1C
Hgb A1c MFr Bld: 5.4
Hgb A1c MFr Bld: 6
Mean Plasma Glucose: 108
Mean Plasma Glucose: 126

## 2011-08-15 LAB — BASIC METABOLIC PANEL
BUN: 14
BUN: 27 — ABNORMAL HIGH
CO2: 25
CO2: 27
Calcium: 7.6 — ABNORMAL LOW
Calcium: 8.9
Creatinine, Ser: 1.3
GFR calc Af Amer: >60
GFR calc non Af Amer: 53 — ABNORMAL LOW
GFR calc non Af Amer: 54 — ABNORMAL LOW
GFR calc non Af Amer: 55 — ABNORMAL LOW
Glucose, Bld: 125 — ABNORMAL HIGH
Glucose, Bld: 131 — ABNORMAL HIGH
Potassium: 4
Sodium: 136
Sodium: 141

## 2011-08-15 LAB — PROTIME-INR: INR: 1.1

## 2011-08-15 LAB — POCT CARDIAC MARKERS: CKMB, poc: 1.6

## 2011-08-15 LAB — ABO/RH: ABO/RH(D): O NEG

## 2011-08-15 LAB — LIPASE, BLOOD: Lipase: 29

## 2011-08-15 LAB — PREPARE RBC (CROSSMATCH)

## 2011-08-15 LAB — OCCULT BLOOD X 1 CARD TO LAB, STOOL
Fecal Occult Bld: POSITIVE
Fecal Occult Bld: POSITIVE

## 2011-08-16 LAB — HEMOGLOBIN AND HEMATOCRIT, BLOOD
HCT: 25.5 — ABNORMAL LOW
HCT: 27.1 — ABNORMAL LOW
HCT: 31.9 — ABNORMAL LOW
HCT: 33.2 — ABNORMAL LOW
Hemoglobin: 11.4 — ABNORMAL LOW
Hemoglobin: 12.1 — ABNORMAL LOW
Hemoglobin: 9.1 — ABNORMAL LOW

## 2011-08-16 LAB — BASIC METABOLIC PANEL
BUN: 17
CO2: 24
Chloride: 112
Glucose, Bld: 161 — ABNORMAL HIGH
Potassium: 3.8

## 2011-08-16 LAB — GLUCOSE, CAPILLARY
Glucose-Capillary: 123 — ABNORMAL HIGH
Glucose-Capillary: 124 — ABNORMAL HIGH
Glucose-Capillary: 125 — ABNORMAL HIGH
Glucose-Capillary: 130 — ABNORMAL HIGH
Glucose-Capillary: 147 — ABNORMAL HIGH
Glucose-Capillary: 170 — ABNORMAL HIGH

## 2011-08-16 LAB — OCCULT BLOOD X 1 CARD TO LAB, STOOL: Fecal Occult Bld: POSITIVE

## 2011-08-17 ENCOUNTER — Ambulatory Visit (INDEPENDENT_AMBULATORY_CARE_PROVIDER_SITE_OTHER): Payer: Medicare Other | Admitting: Family Medicine

## 2011-08-17 DIAGNOSIS — I4891 Unspecified atrial fibrillation: Secondary | ICD-10-CM

## 2011-08-17 DIAGNOSIS — I4892 Unspecified atrial flutter: Secondary | ICD-10-CM

## 2011-08-17 NOTE — Patient Instructions (Signed)
Continue 7.5 mg daily, 5 mg Tue,Sat, recheck 4 weeks

## 2011-08-31 ENCOUNTER — Other Ambulatory Visit (INDEPENDENT_AMBULATORY_CARE_PROVIDER_SITE_OTHER): Payer: Medicare Other

## 2011-08-31 DIAGNOSIS — E119 Type 2 diabetes mellitus without complications: Secondary | ICD-10-CM

## 2011-09-05 ENCOUNTER — Ambulatory Visit (INDEPENDENT_AMBULATORY_CARE_PROVIDER_SITE_OTHER): Payer: Medicare Other | Admitting: Family Medicine

## 2011-09-05 ENCOUNTER — Encounter: Payer: Self-pay | Admitting: Family Medicine

## 2011-09-05 VITALS — BP 132/58 | HR 60 | Temp 98.3°F | Wt 210.0 lb

## 2011-09-05 DIAGNOSIS — Z23 Encounter for immunization: Secondary | ICD-10-CM

## 2011-09-05 DIAGNOSIS — M109 Gout, unspecified: Secondary | ICD-10-CM | POA: Insufficient documentation

## 2011-09-05 DIAGNOSIS — D62 Acute posthemorrhagic anemia: Secondary | ICD-10-CM

## 2011-09-05 DIAGNOSIS — L989 Disorder of the skin and subcutaneous tissue, unspecified: Secondary | ICD-10-CM

## 2011-09-05 DIAGNOSIS — E1149 Type 2 diabetes mellitus with other diabetic neurological complication: Secondary | ICD-10-CM

## 2011-09-05 DIAGNOSIS — Z125 Encounter for screening for malignant neoplasm of prostate: Secondary | ICD-10-CM

## 2011-09-05 DIAGNOSIS — R040 Epistaxis: Secondary | ICD-10-CM | POA: Insufficient documentation

## 2011-09-05 NOTE — Assessment & Plan Note (Signed)
He has f/u with derm scheduled.

## 2011-09-05 NOTE — Assessment & Plan Note (Signed)
D/w pt about gout diet and he'll continue prn colchicine.

## 2011-09-05 NOTE — Assessment & Plan Note (Signed)
Mild, I would continue anticoagulation for now.  D/w pt about not blowing his nose forcefully and pressure prn.  He agrees.

## 2011-09-05 NOTE — Patient Instructions (Addendum)
Please get your eye exam in 2/12.  then come in for a yearly check- 54min OV with labs ahead of time- after the eye exam.   Don't change your meds for now.  Take care.

## 2011-09-05 NOTE — Assessment & Plan Note (Signed)
D/w pt about options and recent recs.  No immediate family history of prostate cancer.  He had 2 friends recently dx'd with CA.  We agreed to consider rectal exam in 12/2011 with possible PSA after the exam.

## 2011-09-05 NOTE — Assessment & Plan Note (Signed)
Will recheck CBC in 12/2011.

## 2011-09-05 NOTE — Assessment & Plan Note (Signed)
A1c controlled, no change in meds. Continue exercise.

## 2011-09-05 NOTE — Progress Notes (Signed)
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    Nosebleed.  Had some nosebleeds (even before the coumadin).  Now with bleeds about once a week, small.  Resolved with pressure/tissue after a few minutes.  Asking for advice.   Gout flare.  "I OD'd on roast beef."  Flare started next day.  Rx for colchicine given by Dr. Posey Pronto.  Used with relief.  I gave gout diet handout today.    Diabetes:  Using medications without difficulties:yes Hypoglycemic episodes:no Hyperglycemic episodes:no Feet problems: dec in sensation on plantar surface x2 Blood Sugars averaging: 100-130 eye exam within last year: yes Exercising more with more yard work.    PMH and SH reviewed  Meds, vitals, and allergies reviewed.   ROS: See HPI.  Otherwise negative.    GEN: nad, alert and oriented HEENT: mucous membranes moist, very small punctate area of clot noted on lateral side of R nostril.  No other lesions seen.   NECK: supple w/o LA CV: rrr. PULM: ctab, no inc wob ABD: soft, +bs EXT: no edema SKIN: no acute rash but healing scab noted on R medial lower leg  Diabetic foot exam: Normal inspection No skin breakdown No calluses  Normal DP pulses Dec sensation to light touch and monofilament on plantar side of feet x2 Nails normal

## 2011-09-14 ENCOUNTER — Ambulatory Visit (INDEPENDENT_AMBULATORY_CARE_PROVIDER_SITE_OTHER): Payer: Medicare Other | Admitting: Family Medicine

## 2011-09-14 DIAGNOSIS — I4892 Unspecified atrial flutter: Secondary | ICD-10-CM

## 2011-09-14 DIAGNOSIS — I4891 Unspecified atrial fibrillation: Secondary | ICD-10-CM

## 2011-09-14 LAB — POCT INR: INR: 2.7

## 2011-09-14 NOTE — Patient Instructions (Signed)
Continue current dose, check in 4 weeks  

## 2011-10-17 ENCOUNTER — Ambulatory Visit (INDEPENDENT_AMBULATORY_CARE_PROVIDER_SITE_OTHER): Payer: Medicare Other | Admitting: Family Medicine

## 2011-10-17 DIAGNOSIS — Z7901 Long term (current) use of anticoagulants: Secondary | ICD-10-CM

## 2011-10-17 DIAGNOSIS — Z5181 Encounter for therapeutic drug level monitoring: Secondary | ICD-10-CM

## 2011-10-17 DIAGNOSIS — I4892 Unspecified atrial flutter: Secondary | ICD-10-CM

## 2011-10-17 DIAGNOSIS — I4891 Unspecified atrial fibrillation: Secondary | ICD-10-CM

## 2011-10-17 NOTE — Patient Instructions (Signed)
Continue 7.5 mg daily, 5 mg Tue,Sat, recheck 4 weeks

## 2011-11-11 ENCOUNTER — Other Ambulatory Visit: Payer: Self-pay | Admitting: Cardiology

## 2011-11-11 MED ORDER — FUROSEMIDE 80 MG PO TABS: 80.0000 mg | ORAL_TABLET | Freq: Every day | ORAL | Status: DC

## 2011-11-11 MED ORDER — ROSUVASTATIN CALCIUM 20 MG PO TABS: 20.0000 mg | ORAL_TABLET | Freq: Every day | ORAL | Status: DC

## 2011-11-21 ENCOUNTER — Ambulatory Visit (INDEPENDENT_AMBULATORY_CARE_PROVIDER_SITE_OTHER): Payer: Medicare Other | Admitting: Family Medicine

## 2011-11-21 DIAGNOSIS — I4892 Unspecified atrial flutter: Secondary | ICD-10-CM

## 2011-11-21 DIAGNOSIS — Z7901 Long term (current) use of anticoagulants: Secondary | ICD-10-CM

## 2011-11-21 DIAGNOSIS — Z5181 Encounter for therapeutic drug level monitoring: Secondary | ICD-10-CM | POA: Diagnosis not present

## 2011-11-21 DIAGNOSIS — I4891 Unspecified atrial fibrillation: Secondary | ICD-10-CM

## 2011-11-21 LAB — POCT INR: INR: 1.7

## 2011-11-21 NOTE — Patient Instructions (Addendum)
Continue current dose, check in 4 weeks, will cut out greens, go back to regular diet.

## 2011-12-07 DIAGNOSIS — J309 Allergic rhinitis, unspecified: Secondary | ICD-10-CM | POA: Diagnosis not present

## 2011-12-19 ENCOUNTER — Ambulatory Visit (INDEPENDENT_AMBULATORY_CARE_PROVIDER_SITE_OTHER): Payer: Medicare Other | Admitting: Family Medicine

## 2011-12-19 DIAGNOSIS — I4891 Unspecified atrial fibrillation: Secondary | ICD-10-CM

## 2011-12-19 DIAGNOSIS — Z5181 Encounter for therapeutic drug level monitoring: Secondary | ICD-10-CM | POA: Diagnosis not present

## 2011-12-19 DIAGNOSIS — I4892 Unspecified atrial flutter: Secondary | ICD-10-CM

## 2011-12-19 DIAGNOSIS — Z7901 Long term (current) use of anticoagulants: Secondary | ICD-10-CM | POA: Diagnosis not present

## 2011-12-19 LAB — POCT INR: INR: 2.6

## 2011-12-19 NOTE — Patient Instructions (Signed)
Continue 7.5 mg daily, 5 mg Tue,Sat, recheck 4 weeks

## 2011-12-23 DIAGNOSIS — J309 Allergic rhinitis, unspecified: Secondary | ICD-10-CM | POA: Diagnosis not present

## 2012-01-04 DIAGNOSIS — J309 Allergic rhinitis, unspecified: Secondary | ICD-10-CM | POA: Diagnosis not present

## 2012-01-09 ENCOUNTER — Other Ambulatory Visit: Payer: Self-pay | Admitting: *Deleted

## 2012-01-09 MED ORDER — FUROSEMIDE 80 MG PO TABS: 80.0000 mg | ORAL_TABLET | Freq: Every day | ORAL | Status: DC

## 2012-01-16 ENCOUNTER — Ambulatory Visit (INDEPENDENT_AMBULATORY_CARE_PROVIDER_SITE_OTHER): Payer: Medicare Other | Admitting: Family Medicine

## 2012-01-16 DIAGNOSIS — I4891 Unspecified atrial fibrillation: Secondary | ICD-10-CM

## 2012-01-16 DIAGNOSIS — I4892 Unspecified atrial flutter: Secondary | ICD-10-CM

## 2012-01-16 LAB — POCT INR: INR: 2.6

## 2012-01-16 NOTE — Patient Instructions (Signed)
Continue current dose, check in 4 weeks  

## 2012-01-25 DIAGNOSIS — J309 Allergic rhinitis, unspecified: Secondary | ICD-10-CM | POA: Diagnosis not present

## 2012-01-27 DIAGNOSIS — R5383 Other fatigue: Secondary | ICD-10-CM | POA: Diagnosis not present

## 2012-01-27 DIAGNOSIS — I129 Hypertensive chronic kidney disease with stage 1 through stage 4 chronic kidney disease, or unspecified chronic kidney disease: Secondary | ICD-10-CM | POA: Diagnosis not present

## 2012-01-27 DIAGNOSIS — N2581 Secondary hyperparathyroidism of renal origin: Secondary | ICD-10-CM | POA: Diagnosis not present

## 2012-02-06 ENCOUNTER — Encounter: Payer: Self-pay | Admitting: Family Medicine

## 2012-02-14 DIAGNOSIS — H52229 Regular astigmatism, unspecified eye: Secondary | ICD-10-CM | POA: Diagnosis not present

## 2012-02-14 DIAGNOSIS — E119 Type 2 diabetes mellitus without complications: Secondary | ICD-10-CM | POA: Diagnosis not present

## 2012-02-14 DIAGNOSIS — H5210 Myopia, unspecified eye: Secondary | ICD-10-CM | POA: Diagnosis not present

## 2012-02-14 DIAGNOSIS — H47039 Optic nerve hypoplasia, unspecified eye: Secondary | ICD-10-CM | POA: Diagnosis not present

## 2012-02-14 DIAGNOSIS — J309 Allergic rhinitis, unspecified: Secondary | ICD-10-CM | POA: Diagnosis not present

## 2012-02-27 ENCOUNTER — Other Ambulatory Visit (INDEPENDENT_AMBULATORY_CARE_PROVIDER_SITE_OTHER): Payer: Medicare Other

## 2012-02-27 ENCOUNTER — Other Ambulatory Visit: Payer: Self-pay | Admitting: Family Medicine

## 2012-02-27 ENCOUNTER — Ambulatory Visit (INDEPENDENT_AMBULATORY_CARE_PROVIDER_SITE_OTHER): Payer: Medicare Other | Admitting: Family Medicine

## 2012-02-27 DIAGNOSIS — D62 Acute posthemorrhagic anemia: Secondary | ICD-10-CM | POA: Diagnosis not present

## 2012-02-27 DIAGNOSIS — E1142 Type 2 diabetes mellitus with diabetic polyneuropathy: Secondary | ICD-10-CM | POA: Diagnosis not present

## 2012-02-27 DIAGNOSIS — Z7901 Long term (current) use of anticoagulants: Secondary | ICD-10-CM | POA: Diagnosis not present

## 2012-02-27 DIAGNOSIS — Z5181 Encounter for therapeutic drug level monitoring: Secondary | ICD-10-CM | POA: Diagnosis not present

## 2012-02-27 DIAGNOSIS — N189 Chronic kidney disease, unspecified: Secondary | ICD-10-CM

## 2012-02-27 DIAGNOSIS — I4891 Unspecified atrial fibrillation: Secondary | ICD-10-CM | POA: Diagnosis not present

## 2012-02-27 DIAGNOSIS — I4892 Unspecified atrial flutter: Secondary | ICD-10-CM

## 2012-02-27 DIAGNOSIS — E1149 Type 2 diabetes mellitus with other diabetic neurological complication: Secondary | ICD-10-CM

## 2012-02-27 LAB — COMPREHENSIVE METABOLIC PANEL
ALT: 38 U/L (ref 0–53)
Albumin: 4.1 g/dL (ref 3.5–5.2)
CO2: 25 mEq/L (ref 19–32)
Calcium: 9.6 mg/dL (ref 8.4–10.5)
Chloride: 105 mEq/L (ref 96–112)
Creatinine, Ser: 2.7 mg/dL — ABNORMAL HIGH (ref 0.4–1.5)
GFR: 24.95 mL/min — ABNORMAL LOW (ref >60.00)
Potassium: 4.2 mEq/L (ref 3.5–5.1)
Total Protein: 7.2 g/dL (ref 6.0–8.3)

## 2012-02-27 LAB — LIPID PANEL
Total CHOL/HDL Ratio: 4
Triglycerides: 195 mg/dL — ABNORMAL HIGH (ref 0.0–149.0)

## 2012-02-27 LAB — CBC WITH DIFFERENTIAL/PLATELET
Basophils Absolute: 0 10*3/uL (ref 0.0–0.1)
Basophils Relative: 0.4 % (ref 0.0–3.0)
Eosinophils Absolute: 0.3 10*3/uL (ref 0.0–0.7)
HCT: 36.1 % — ABNORMAL LOW (ref 39.0–52.0)
Hemoglobin: 12 g/dL — ABNORMAL LOW (ref 13.0–17.0)
Lymphs Abs: 1.4 10*3/uL (ref 0.7–4.0)
MCHC: 33.3 g/dL (ref 30.0–36.0)
MCV: 91.2 fl (ref 78.0–100.0)
Monocytes Absolute: 0.4 10*3/uL (ref 0.1–1.0)
Neutro Abs: 3.2 10*3/uL (ref 1.4–7.7)
RBC: 3.96 Mil/uL — ABNORMAL LOW (ref 4.22–5.81)
RDW: 15.3 % — ABNORMAL HIGH (ref 11.5–14.6)

## 2012-02-27 LAB — MICROALBUMIN / CREATININE URINE RATIO: Creatinine,U: 189.9 mg/dL

## 2012-02-27 NOTE — Patient Instructions (Signed)
Hold tomorrows dose (has already taken today)  then restart 7.5 mg daily, 5 mg Tue,Sat, recheck 2 weeks

## 2012-03-01 ENCOUNTER — Ambulatory Visit (INDEPENDENT_AMBULATORY_CARE_PROVIDER_SITE_OTHER): Payer: Medicare Other | Admitting: Family Medicine

## 2012-03-01 ENCOUNTER — Encounter: Payer: Self-pay | Admitting: Family Medicine

## 2012-03-01 VITALS — BP 152/66 | HR 70 | Temp 98.4°F | Wt 208.8 lb

## 2012-03-01 DIAGNOSIS — I10 Essential (primary) hypertension: Secondary | ICD-10-CM | POA: Diagnosis not present

## 2012-03-01 DIAGNOSIS — E119 Type 2 diabetes mellitus without complications: Secondary | ICD-10-CM | POA: Diagnosis not present

## 2012-03-01 DIAGNOSIS — E785 Hyperlipidemia, unspecified: Secondary | ICD-10-CM

## 2012-03-01 DIAGNOSIS — Z0000 Encounter for general adult medical examination without abnormal findings: Secondary | ICD-10-CM

## 2012-03-01 DIAGNOSIS — N529 Male erectile dysfunction, unspecified: Secondary | ICD-10-CM

## 2012-03-01 DIAGNOSIS — N183 Chronic kidney disease, stage 3 unspecified: Secondary | ICD-10-CM

## 2012-03-01 DIAGNOSIS — Z8669 Personal history of other diseases of the nervous system and sense organs: Secondary | ICD-10-CM

## 2012-03-01 MED ORDER — INSULIN GLARGINE 100 UNIT/ML ~~LOC~~ SOLN: 30.0000 [IU] | Freq: Every day | SUBCUTANEOUS | Status: DC

## 2012-03-01 MED ORDER — AMLODIPINE BESYLATE 10 MG PO TABS: ORAL_TABLET | ORAL | Status: DC

## 2012-03-01 MED ORDER — SILDENAFIL CITRATE 100 MG PO TABS: ORAL_TABLET | ORAL | Status: DC

## 2012-03-01 NOTE — Patient Instructions (Signed)
Start back on the amlodipine and I'll await the labs from the kidney doctors.   Recheck A1c in 6 months.   Take care.

## 2012-03-01 NOTE — Progress Notes (Signed)
I have personally reviewed the Medicare Annual Wellness questionnaire and have noted 1. The patient's medical and social history 2. Their use of alcohol, tobacco or illicit drugs 3. Their current medications and supplements 4. The patient's functional ability including ADL's, fall risks, home safety risks and hearing or visual             impairment. 5. Diet and physical activities 6. Evidence for depression or mood disorders  The patients weight, height, BMI  have been recorded in the chart and visual acuity is per eye clinic.  I have made referrals, counseling and provided education to the patient based review of the above and I have provided the pt with a written personalized care plan for preventive services.  See plan.  Routine anticipatory guidance given to patient.  See health maintenance.  See scanned forms.   Td 2010 Flu done PNA and shingles shot prev done.  PSA not indicated, prev d/w pt.  Colonoscopy done 2009  CKD with recent inc in Cr, repeat labs pending.  Followed by renal, ARB held in meantime.   Diabetes:  Using medications without difficulties:yes Hypoglycemic episodes:no Hyperglycemic episodes:no Feet problems:no Blood Sugars averaging: 120-140s eye exam within last year: yes  Hypertension:    Using medication without problems or lightheadedness: yes, but had been off amlodipine Chest pain with exertion:no Edema:no Short of breath:no  H/o ED, fatigue and lower muscle mass.  We had talked about checking a T level.  I didn't put in the order prev, but it will be added on.  D/w pt.  It is unclear if his muscle sx are related to low T or the crestor use.    PMH and SH reviewed  ROS: See HPI, otherwise noncontributory.  Meds, vitals, and allergies reviewed.   GEN: nad, alert and oriented HEENT: mucous membranes moist NECK: supple w/o LA CV: rrr.  PULM: ctab, no inc wob ABD: soft, +bs EXT: no edema SKIN: no acute rash  Diabetic foot exam: Normal  inspection No skin breakdown No calluses  Normal DP pulses Dec sensation to light touch and monofilament Nails normal

## 2012-03-02 ENCOUNTER — Encounter: Payer: Self-pay | Admitting: Family Medicine

## 2012-03-02 DIAGNOSIS — N179 Acute kidney failure, unspecified: Secondary | ICD-10-CM | POA: Diagnosis not present

## 2012-03-02 DIAGNOSIS — N529 Male erectile dysfunction, unspecified: Secondary | ICD-10-CM | POA: Diagnosis not present

## 2012-03-05 ENCOUNTER — Encounter: Payer: Self-pay | Admitting: Family Medicine

## 2012-03-05 DIAGNOSIS — Z0000 Encounter for general adult medical examination without abnormal findings: Secondary | ICD-10-CM | POA: Insufficient documentation

## 2012-03-05 DIAGNOSIS — E291 Testicular hypofunction: Secondary | ICD-10-CM | POA: Insufficient documentation

## 2012-03-05 NOTE — Assessment & Plan Note (Signed)
With low T noted on labs, can refer to uro after CKD is addressed.

## 2012-03-05 NOTE — Assessment & Plan Note (Signed)
LDL at goal, will work on diet for TGs.

## 2012-03-05 NOTE — Assessment & Plan Note (Signed)
A1c 7, continue current meds and diet.  labs d/w pt.

## 2012-03-05 NOTE — Assessment & Plan Note (Signed)
Per renal 

## 2012-03-05 NOTE — Assessment & Plan Note (Signed)
Per eye clinic.

## 2012-03-05 NOTE — Assessment & Plan Note (Signed)
With recent bump in Cr, per renal with ARB held and repeat labs to be done per renal.

## 2012-03-09 DIAGNOSIS — J309 Allergic rhinitis, unspecified: Secondary | ICD-10-CM | POA: Diagnosis not present

## 2012-03-12 ENCOUNTER — Ambulatory Visit: Payer: Medicare Other

## 2012-03-15 DIAGNOSIS — J309 Allergic rhinitis, unspecified: Secondary | ICD-10-CM | POA: Diagnosis not present

## 2012-03-19 ENCOUNTER — Telehealth: Payer: Self-pay | Admitting: Family Medicine

## 2012-03-19 ENCOUNTER — Ambulatory Visit (INDEPENDENT_AMBULATORY_CARE_PROVIDER_SITE_OTHER): Payer: Medicare Other | Admitting: Family Medicine

## 2012-03-19 DIAGNOSIS — I4891 Unspecified atrial fibrillation: Secondary | ICD-10-CM

## 2012-03-19 DIAGNOSIS — I4892 Unspecified atrial flutter: Secondary | ICD-10-CM

## 2012-03-19 DIAGNOSIS — Z5181 Encounter for therapeutic drug level monitoring: Secondary | ICD-10-CM | POA: Diagnosis not present

## 2012-03-19 DIAGNOSIS — Z7901 Long term (current) use of anticoagulants: Secondary | ICD-10-CM

## 2012-03-19 NOTE — Telephone Encounter (Signed)
Notify pt.  If he has any more blood in stool, then notify us.  Thanks.

## 2012-03-19 NOTE — Telephone Encounter (Signed)
Patient notified as instructed by telephone. 

## 2012-03-19 NOTE — Patient Instructions (Signed)
Continue 7.5 mg daily, 5 mg Tue,Sat, recheck 2 weeks

## 2012-04-02 ENCOUNTER — Ambulatory Visit (INDEPENDENT_AMBULATORY_CARE_PROVIDER_SITE_OTHER): Payer: Medicare Other | Admitting: Family Medicine

## 2012-04-02 DIAGNOSIS — I4892 Unspecified atrial flutter: Secondary | ICD-10-CM | POA: Diagnosis not present

## 2012-04-02 DIAGNOSIS — Z5181 Encounter for therapeutic drug level monitoring: Secondary | ICD-10-CM

## 2012-04-02 DIAGNOSIS — Z7901 Long term (current) use of anticoagulants: Secondary | ICD-10-CM

## 2012-04-02 DIAGNOSIS — I4891 Unspecified atrial fibrillation: Secondary | ICD-10-CM | POA: Diagnosis not present

## 2012-04-02 LAB — POCT INR: INR: 4.6

## 2012-04-02 NOTE — Patient Instructions (Signed)
Hold x 2 days, check onThur, then reduce dose by 5 mg weekly 5 mg daily,7.5 mg MWF

## 2012-04-05 ENCOUNTER — Ambulatory Visit (INDEPENDENT_AMBULATORY_CARE_PROVIDER_SITE_OTHER): Payer: Medicare Other | Admitting: Family Medicine

## 2012-04-05 DIAGNOSIS — I4891 Unspecified atrial fibrillation: Secondary | ICD-10-CM

## 2012-04-05 DIAGNOSIS — Z5181 Encounter for therapeutic drug level monitoring: Secondary | ICD-10-CM | POA: Diagnosis not present

## 2012-04-05 DIAGNOSIS — J309 Allergic rhinitis, unspecified: Secondary | ICD-10-CM | POA: Diagnosis not present

## 2012-04-05 DIAGNOSIS — Z7901 Long term (current) use of anticoagulants: Secondary | ICD-10-CM | POA: Diagnosis not present

## 2012-04-05 DIAGNOSIS — I4892 Unspecified atrial flutter: Secondary | ICD-10-CM | POA: Diagnosis not present

## 2012-04-05 LAB — POCT INR: INR: 1.7

## 2012-04-05 NOTE — Patient Instructions (Signed)
5 mg daily,7.5 mg MWF 2 weeks

## 2012-04-12 DIAGNOSIS — J309 Allergic rhinitis, unspecified: Secondary | ICD-10-CM | POA: Diagnosis not present

## 2012-04-16 ENCOUNTER — Ambulatory Visit: Payer: Medicare Other

## 2012-04-19 ENCOUNTER — Ambulatory Visit (INDEPENDENT_AMBULATORY_CARE_PROVIDER_SITE_OTHER): Payer: Medicare Other | Admitting: Family Medicine

## 2012-04-19 DIAGNOSIS — Z5181 Encounter for therapeutic drug level monitoring: Secondary | ICD-10-CM

## 2012-04-19 DIAGNOSIS — I4891 Unspecified atrial fibrillation: Secondary | ICD-10-CM | POA: Diagnosis not present

## 2012-04-19 DIAGNOSIS — I4892 Unspecified atrial flutter: Secondary | ICD-10-CM | POA: Diagnosis not present

## 2012-04-19 DIAGNOSIS — Z7901 Long term (current) use of anticoagulants: Secondary | ICD-10-CM

## 2012-04-19 NOTE — Patient Instructions (Signed)
7.5 mg  Daily except 5 mg MWF 2 weeks

## 2012-04-26 DIAGNOSIS — J309 Allergic rhinitis, unspecified: Secondary | ICD-10-CM | POA: Diagnosis not present

## 2012-05-02 DIAGNOSIS — H1030 Unspecified acute conjunctivitis, unspecified eye: Secondary | ICD-10-CM | POA: Diagnosis not present

## 2012-05-02 DIAGNOSIS — J301 Allergic rhinitis due to pollen: Secondary | ICD-10-CM | POA: Diagnosis not present

## 2012-05-02 DIAGNOSIS — H1045 Other chronic allergic conjunctivitis: Secondary | ICD-10-CM | POA: Diagnosis not present

## 2012-05-02 DIAGNOSIS — J309 Allergic rhinitis, unspecified: Secondary | ICD-10-CM | POA: Diagnosis not present

## 2012-05-02 DIAGNOSIS — J3089 Other allergic rhinitis: Secondary | ICD-10-CM | POA: Diagnosis not present

## 2012-05-03 ENCOUNTER — Ambulatory Visit (INDEPENDENT_AMBULATORY_CARE_PROVIDER_SITE_OTHER): Payer: Medicare Other | Admitting: Family Medicine

## 2012-05-03 DIAGNOSIS — I4892 Unspecified atrial flutter: Secondary | ICD-10-CM | POA: Diagnosis not present

## 2012-05-03 DIAGNOSIS — Z7901 Long term (current) use of anticoagulants: Secondary | ICD-10-CM

## 2012-05-03 DIAGNOSIS — Z5181 Encounter for therapeutic drug level monitoring: Secondary | ICD-10-CM | POA: Diagnosis not present

## 2012-05-03 DIAGNOSIS — I4891 Unspecified atrial fibrillation: Secondary | ICD-10-CM

## 2012-05-03 LAB — POCT INR: INR: 2.4

## 2012-05-03 NOTE — Patient Instructions (Signed)
Continue current dose, check in 4 weeks  

## 2012-05-24 DIAGNOSIS — J309 Allergic rhinitis, unspecified: Secondary | ICD-10-CM | POA: Diagnosis not present

## 2012-06-04 ENCOUNTER — Ambulatory Visit (INDEPENDENT_AMBULATORY_CARE_PROVIDER_SITE_OTHER): Payer: Medicare Other | Admitting: Family Medicine

## 2012-06-04 DIAGNOSIS — Z7901 Long term (current) use of anticoagulants: Secondary | ICD-10-CM

## 2012-06-04 DIAGNOSIS — I4892 Unspecified atrial flutter: Secondary | ICD-10-CM

## 2012-06-04 DIAGNOSIS — Z5181 Encounter for therapeutic drug level monitoring: Secondary | ICD-10-CM | POA: Diagnosis not present

## 2012-06-04 DIAGNOSIS — I4891 Unspecified atrial fibrillation: Secondary | ICD-10-CM | POA: Diagnosis not present

## 2012-06-04 LAB — POCT INR: INR: 3.1

## 2012-06-04 NOTE — Patient Instructions (Signed)
7.5 mg daily, 5 mg T/TH/SAT 1 week

## 2012-06-19 ENCOUNTER — Telehealth: Payer: Self-pay | Admitting: Cardiology

## 2012-06-19 NOTE — Telephone Encounter (Signed)
Walk In pt Form " Pt Dropped Off PRescription Renewal form" sent  To Message Nurse 06/19/12/KM

## 2012-06-22 ENCOUNTER — Other Ambulatory Visit: Payer: Self-pay

## 2012-06-22 MED ORDER — AMLODIPINE BESYLATE 10 MG PO TABS: ORAL_TABLET | ORAL | Status: DC

## 2012-06-22 MED ORDER — ROSUVASTATIN CALCIUM 20 MG PO TABS: 20.0000 mg | ORAL_TABLET | Freq: Every day | ORAL | Status: DC

## 2012-06-22 MED ORDER — FUROSEMIDE 80 MG PO TABS: 80.0000 mg | ORAL_TABLET | Freq: Every day | ORAL | Status: DC

## 2012-06-22 MED ORDER — METOPROLOL SUCCINATE ER 50 MG PO TB24: 50.0000 mg | ORAL_TABLET | Freq: Every day | ORAL | Status: DC

## 2012-06-22 NOTE — Progress Notes (Signed)
Pt requesting refills of his meds.  Ok to refill per Dr [name].

## 2012-07-09 ENCOUNTER — Ambulatory Visit (INDEPENDENT_AMBULATORY_CARE_PROVIDER_SITE_OTHER): Payer: Medicare Other | Admitting: Family Medicine

## 2012-07-09 DIAGNOSIS — Z7901 Long term (current) use of anticoagulants: Secondary | ICD-10-CM | POA: Diagnosis not present

## 2012-07-09 DIAGNOSIS — Z5181 Encounter for therapeutic drug level monitoring: Secondary | ICD-10-CM | POA: Diagnosis not present

## 2012-07-09 DIAGNOSIS — I4892 Unspecified atrial flutter: Secondary | ICD-10-CM | POA: Diagnosis not present

## 2012-07-09 DIAGNOSIS — I4891 Unspecified atrial fibrillation: Secondary | ICD-10-CM | POA: Diagnosis not present

## 2012-07-09 LAB — POCT INR: INR: 2.5

## 2012-07-09 NOTE — Patient Instructions (Signed)
7.5 mg daily except 3.75 mg MWF, recheck 4 weeks

## 2012-07-17 DIAGNOSIS — R5381 Other malaise: Secondary | ICD-10-CM | POA: Diagnosis not present

## 2012-07-17 DIAGNOSIS — N2581 Secondary hyperparathyroidism of renal origin: Secondary | ICD-10-CM | POA: Diagnosis not present

## 2012-07-26 DIAGNOSIS — R5383 Other fatigue: Secondary | ICD-10-CM | POA: Diagnosis not present

## 2012-07-26 DIAGNOSIS — N2581 Secondary hyperparathyroidism of renal origin: Secondary | ICD-10-CM | POA: Diagnosis not present

## 2012-07-26 DIAGNOSIS — I129 Hypertensive chronic kidney disease with stage 1 through stage 4 chronic kidney disease, or unspecified chronic kidney disease: Secondary | ICD-10-CM | POA: Diagnosis not present

## 2012-08-13 ENCOUNTER — Ambulatory Visit (INDEPENDENT_AMBULATORY_CARE_PROVIDER_SITE_OTHER): Payer: Medicare Other | Admitting: Family Medicine

## 2012-08-13 DIAGNOSIS — Z7901 Long term (current) use of anticoagulants: Secondary | ICD-10-CM

## 2012-08-13 DIAGNOSIS — I4892 Unspecified atrial flutter: Secondary | ICD-10-CM

## 2012-08-13 DIAGNOSIS — I4891 Unspecified atrial fibrillation: Secondary | ICD-10-CM | POA: Diagnosis not present

## 2012-08-13 DIAGNOSIS — Z5181 Encounter for therapeutic drug level monitoring: Secondary | ICD-10-CM | POA: Diagnosis not present

## 2012-08-13 LAB — POCT INR: INR: 3.2

## 2012-08-13 MED ORDER — WARFARIN SODIUM 7.5 MG PO TABS: ORAL_TABLET | ORAL | Status: DC

## 2012-08-13 MED ORDER — WARFARIN SODIUM 7.5 MG PO TABS: 7.5000 mg | ORAL_TABLET | ORAL | Status: DC

## 2012-08-13 NOTE — Patient Instructions (Signed)
3.75 mg daily, except 7.5 mg M/W/F  recheck 2weeks

## 2012-08-27 ENCOUNTER — Ambulatory Visit (INDEPENDENT_AMBULATORY_CARE_PROVIDER_SITE_OTHER): Payer: Medicare Other | Admitting: Family Medicine

## 2012-08-27 DIAGNOSIS — I4891 Unspecified atrial fibrillation: Secondary | ICD-10-CM

## 2012-08-27 DIAGNOSIS — Z5181 Encounter for therapeutic drug level monitoring: Secondary | ICD-10-CM | POA: Diagnosis not present

## 2012-08-27 DIAGNOSIS — Z7901 Long term (current) use of anticoagulants: Secondary | ICD-10-CM | POA: Diagnosis not present

## 2012-08-27 DIAGNOSIS — I4892 Unspecified atrial flutter: Secondary | ICD-10-CM | POA: Diagnosis not present

## 2012-08-27 NOTE — Patient Instructions (Signed)
Continue 3.75 mg daily, except 7.5 mg M/W/F  recheck 4weeks

## 2012-08-30 ENCOUNTER — Telehealth: Payer: Self-pay | Admitting: Cardiology

## 2012-08-30 DIAGNOSIS — I6529 Occlusion and stenosis of unspecified carotid artery: Secondary | ICD-10-CM

## 2012-08-30 NOTE — Telephone Encounter (Signed)
[name] states he is overdue for a carotid doppler and f/u visit with Dr [name].  Appt scheduled with Dr [name].  Last carotid doppler was in Feb 2012 and f/u doppler was supposed to be in one year.  Carotid doppler was ordered.

## 2012-08-30 NOTE — Telephone Encounter (Signed)
plz return call to patient regarding carotid order

## 2012-09-24 ENCOUNTER — Ambulatory Visit (INDEPENDENT_AMBULATORY_CARE_PROVIDER_SITE_OTHER): Payer: Medicare Other | Admitting: Family Medicine

## 2012-09-24 ENCOUNTER — Ambulatory Visit (INDEPENDENT_AMBULATORY_CARE_PROVIDER_SITE_OTHER): Payer: Medicare Other

## 2012-09-24 DIAGNOSIS — Z23 Encounter for immunization: Secondary | ICD-10-CM

## 2012-09-24 DIAGNOSIS — Z5181 Encounter for therapeutic drug level monitoring: Secondary | ICD-10-CM | POA: Diagnosis not present

## 2012-09-24 DIAGNOSIS — I4892 Unspecified atrial flutter: Secondary | ICD-10-CM

## 2012-09-24 DIAGNOSIS — Z7901 Long term (current) use of anticoagulants: Secondary | ICD-10-CM

## 2012-09-24 DIAGNOSIS — I4891 Unspecified atrial fibrillation: Secondary | ICD-10-CM

## 2012-09-24 LAB — POCT INR: INR: 1.7

## 2012-09-24 NOTE — Patient Instructions (Signed)
Increase to 7.5 mg daily except 3.75 mg M/W/F (increased by 3.75 mg weekly)  recheck  2weeks

## 2012-10-01 ENCOUNTER — Encounter (INDEPENDENT_AMBULATORY_CARE_PROVIDER_SITE_OTHER): Payer: Medicare Other

## 2012-10-01 DIAGNOSIS — I6529 Occlusion and stenosis of unspecified carotid artery: Secondary | ICD-10-CM

## 2012-10-07 ENCOUNTER — Encounter: Payer: Self-pay | Admitting: Cardiology

## 2012-10-07 DIAGNOSIS — I739 Peripheral vascular disease, unspecified: Secondary | ICD-10-CM

## 2012-10-07 DIAGNOSIS — Z951 Presence of aortocoronary bypass graft: Secondary | ICD-10-CM | POA: Insufficient documentation

## 2012-10-07 DIAGNOSIS — I340 Nonrheumatic mitral (valve) insufficiency: Secondary | ICD-10-CM | POA: Insufficient documentation

## 2012-10-07 DIAGNOSIS — Z8719 Personal history of other diseases of the digestive system: Secondary | ICD-10-CM | POA: Insufficient documentation

## 2012-10-07 DIAGNOSIS — R9430 Abnormal result of cardiovascular function study, unspecified: Secondary | ICD-10-CM | POA: Insufficient documentation

## 2012-10-07 DIAGNOSIS — I779 Disorder of arteries and arterioles, unspecified: Secondary | ICD-10-CM | POA: Insufficient documentation

## 2012-10-08 ENCOUNTER — Ambulatory Visit (INDEPENDENT_AMBULATORY_CARE_PROVIDER_SITE_OTHER): Payer: Medicare Other | Admitting: Family Medicine

## 2012-10-08 DIAGNOSIS — I4891 Unspecified atrial fibrillation: Secondary | ICD-10-CM | POA: Diagnosis not present

## 2012-10-08 DIAGNOSIS — Z7901 Long term (current) use of anticoagulants: Secondary | ICD-10-CM | POA: Diagnosis not present

## 2012-10-08 DIAGNOSIS — Z5181 Encounter for therapeutic drug level monitoring: Secondary | ICD-10-CM | POA: Diagnosis not present

## 2012-10-08 DIAGNOSIS — I4892 Unspecified atrial flutter: Secondary | ICD-10-CM | POA: Diagnosis not present

## 2012-10-08 NOTE — Patient Instructions (Signed)
Increase to 7.5 mg daily except 3.75 mg W/Sat  (increased by 3.75 mg weekly)  recheck  2weeks

## 2012-10-09 ENCOUNTER — Encounter: Payer: Self-pay | Admitting: Cardiology

## 2012-10-09 ENCOUNTER — Ambulatory Visit (INDEPENDENT_AMBULATORY_CARE_PROVIDER_SITE_OTHER): Payer: Medicare Other | Admitting: Cardiology

## 2012-10-09 VITALS — BP 154/60 | HR 65 | Ht 69.0 in | Wt 204.0 lb

## 2012-10-09 DIAGNOSIS — K922 Gastrointestinal hemorrhage, unspecified: Secondary | ICD-10-CM

## 2012-10-09 DIAGNOSIS — I4892 Unspecified atrial flutter: Secondary | ICD-10-CM

## 2012-10-09 DIAGNOSIS — R609 Edema, unspecified: Secondary | ICD-10-CM

## 2012-10-09 DIAGNOSIS — E785 Hyperlipidemia, unspecified: Secondary | ICD-10-CM

## 2012-10-09 DIAGNOSIS — I4510 Unspecified right bundle-branch block: Secondary | ICD-10-CM | POA: Insufficient documentation

## 2012-10-09 DIAGNOSIS — I4891 Unspecified atrial fibrillation: Secondary | ICD-10-CM

## 2012-10-09 DIAGNOSIS — I10 Essential (primary) hypertension: Secondary | ICD-10-CM | POA: Diagnosis not present

## 2012-10-09 DIAGNOSIS — I2510 Atherosclerotic heart disease of native coronary artery without angina pectoris: Secondary | ICD-10-CM | POA: Diagnosis not present

## 2012-10-09 DIAGNOSIS — I779 Disorder of arteries and arterioles, unspecified: Secondary | ICD-10-CM

## 2012-10-09 DIAGNOSIS — I059 Rheumatic mitral valve disease, unspecified: Secondary | ICD-10-CM

## 2012-10-09 DIAGNOSIS — I340 Nonrheumatic mitral (valve) insufficiency: Secondary | ICD-10-CM

## 2012-10-09 DIAGNOSIS — M25519 Pain in unspecified shoulder: Secondary | ICD-10-CM | POA: Insufficient documentation

## 2012-10-09 NOTE — Assessment & Plan Note (Signed)
The patient's renal function is being followed carefully by the nephrology team. No further workup.

## 2012-10-09 NOTE — Assessment & Plan Note (Signed)
There is mild mitral regurgitation by echo, 2011. Followup echo was not needed at this time.

## 2012-10-09 NOTE — Assessment & Plan Note (Signed)
Systolic blood pressure is slightly elevated today. He hopes to (loose some weight. I've chosen not to change his medicine.

## 2012-10-09 NOTE — Assessment & Plan Note (Signed)
He had severe GI bleeding with multiple transfusions in 2009. He was taken off aspirin.

## 2012-10-09 NOTE — Assessment & Plan Note (Signed)
The patient had lowered his Crestor dose to see if it helped with the aching in the shoulder. This did not affect the aching in the shoulder. He will resume his daily Crestor dose.

## 2012-10-09 NOTE — Assessment & Plan Note (Signed)
His volume status is stable. No change in therapy. 

## 2012-10-09 NOTE — Assessment & Plan Note (Signed)
Pain in his shoulder it does not appear to be affected by adjusting his Crestor dose. I doubt that it is related to a statin.  I will see him back in 6 months to followup.

## 2012-10-09 NOTE — Assessment & Plan Note (Signed)
Coronary disease is stable. He is undergoing CABG twice in the past. Catheterization was done in September, 2011. Grafts were patent. It was therefore felt that symptoms at that time was probably related to his atrial arrhythmia. Fortunately his rate arrhythmias now under control.

## 2012-10-09 NOTE — Assessment & Plan Note (Signed)
The patient had atrial fibrillation and atrial flutter in 2011. Decision was made to anticoagulate him to watch his rhythm. Currently he may be having some atrial fib or flutter at nighttime. However he does not appear to have this in the daytime. It does not appear to limit him symptomatically in a significant way. He is anticoagulated. I've chosen not to place any type of monitors. I will see him back for followup.

## 2012-10-09 NOTE — Patient Instructions (Addendum)
Your physician has recommended you make the following change in your medication: Go back to your normal daily dose of crestor, otherwise continue on your current medications as directed. Please refer to the Current Medication list given to you today.  Your physician wants you to follow-up in: 6 months.   You will receive a reminder letter in the mail two months in advance. If you don't receive a letter, please call our office to schedule the follow-up appointment.

## 2012-10-09 NOTE — Progress Notes (Signed)
HPI  Patient is seen for followup coronary disease, edema, carotid artery disease, atrial fibrillation, atrial flutter. I saw him last December, 2011. He's doing well. He's had some mild palpitations. He has not had any prolonged episodes. In 2011 he had atrial flutter. Consideration was being given for atrial flutter ablation. He actually saw Dr. Caryl Comes. However he then had atrial fibrillation. He then converted to sinus. Considering all of those issues it was felt that he should be followed clinically with anticoagulation. Tikosyn or amiodarone  could be considered for his rhythm over time. He has done well. He mentions that he does feel some irregularity in his heart beat at nighttime when resting. He does not feel his daytime.. His physical conditioning is not as good as it had been. However several days ago he was able to go up 17 stairs in his house on multiple occasions to move some tables. He tolerated this without difficulty.He continues on Coumadin.  He's had some aching in his shoulder. He cut his Crestor dose back and this did not affect the aching in his shoulder. He has gained some weight. It is of note that his original symptom with ischemia was a burning sensation with walking. He had this before he had both his original CABG in his redo CABG.  As part of today's evaluation I have reviewed his old records carefully. I have updated the current electronic medical records with careful attention to the problem list in the past medical history.  No Known Allergies  Current Outpatient Prescriptions  Medication Sig Dispense Refill  . acetaminophen (TYLENOL) 500 MG tablet Take 500 mg by mouth every 6 (six) hours as needed.      [name] amLODipine (NORVASC) 10 MG tablet Take 10 mg by mouth daily.      . colchicine 0.6 MG tablet Take 0.6 mg by mouth daily as needed.        . fish oil-omega-3 fatty acids 1000 MG capsule Take 2 g by mouth 2 (two) times daily.        . furosemide (LASIX) 80 MG tablet  Take 1 tablet (80 mg total) by mouth daily.  90 tablet  1  . insulin glargine (LANTUS) 100 UNIT/ML injection Inject 30 Units into the skin at bedtime. As directed, add 1 unit if AM sugar >120, dec 1 unit if your sugar is <80  40 mL  3  . Insulin Syringe-Needle U-100 (INSULIN SYRINGE .3CC/31GX5/16") 31G X 5/16" 0.3 ML MISC Use daily as instructed.       . magnesium oxide (MAG-OX) 400 MG tablet Take 400 mg by mouth daily.        . metoprolol succinate (TOPROL-XL) 50 MG 24 hr tablet Take 1 tablet (50 mg total) by mouth daily.  90 tablet  1  . Multiple Vitamin (MULTIVITAMIN) tablet Take 1 tablet by mouth daily.        . Potassium Gluconate (K-99) 595 MG CAPS Take 1 capsule by mouth daily.        . rosuvastatin (CRESTOR) 20 MG tablet Take 1 tablet (20 mg total) by mouth daily.  90 tablet  1  . sildenafil (VIAGRA) 100 MG tablet Take 1 tablet one hour prior.  30 tablet  3  . warfarin (COUMADIN) 7.5 MG tablet Take one tablet by mouth daily or as directed  100 tablet  2  . warfarin (COUMADIN) 7.5 MG tablet Take 1 tablet (7.5 mg total) by mouth as directed.  30 tablet  1  . [  DISCONTINUED] amLODipine (NORVASC) 10 MG tablet Take 1/2 tablet by mouth daily.  45 tablet  1    History   Social History  . Marital Status: Widowed    Spouse Name: N/A    Number of Children: 2  . Years of Education: N/A   Occupational History  . 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 Smoking status: Former Smoker    Quit date: 11/14/1958  . Smokeless tobacco: Not on file  . Alcohol Use: No  . Drug Use: No  . Sexually Active: Not on file   Other Topics Concern  . Not on file   Social History Narrative   From [place].  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  Widower 12/2005    Family History  Problem Relation Age of Onset  . Kidney disease Mother     Kidney failure  . Stroke Mother   . Diabetes Mother   . Heart disease Father     MI  . Arthritis  Sister   . Cancer Sister     Throat  . Heart disease Sister     MI  . Diabetes Sister   . Prostate cancer Neg Hx   . Colon cancer Neg Hx     Past Medical History  Diagnosis Date  . GERD (gastroesophageal reflux disease)   . Anemia     Secondary to acute blood loss  . Allergy     Environmental  . Hypertension   . Hyperlipidemia   . Diabetes mellitus     Type II  . Diabetic peripheral neuropathy   . RBBB (right bundle branch block)   . Macular degeneration   . Sleep apnea   . Systolic murmur   . Carotid artery disease     Doppler 09/18/2009 - 49% bilateral stenoses  . Special screening for malignant neoplasm of prostate   . Personal history of colonic polyps   . Diverticulosis of colon with hemorrhage   . Special screening for malignant neoplasms of other sites   . Benign neoplasm of colon   . Erectile dysfunction     Mild  . Diverticulosis of colon (without mention of hemorrhage)   . Edema   . Myalgia   . PNA (pneumonia) 9/11    NSTEMI at [facility] with repeat cath, rec medical mgmt   . Atrial flutter 07/2010    September, 2011   Hospital with PNA and cath done.Marland KitchenMarland KitchenCoumadin.  Atrial flutter ablation planned, but  pt. then had atrial fibrillation,/outpatient conversion 09/08/10..NSR..plan to follow..Dr. Caryl Comes  . GI bleeding     Off aspirin because of prior GI bleed, Severe, 2009, multiple units of blood transfused, patient taken off aspirin  . Chronic kidney disease     ckd #3  . Gout   . Skin cancer     R lower leg, per derm 2012  . Atrial fibrillation     Consideration was given for atrial flutter ablation, but patient developed atrial fibrillation. Cardioversion was done. Dr. Caryl Comes decided to watch him clinically. November, 2011  . Neuropathy     Diabetic  . Carotid artery disease     49% bilateral, Doppler, November, 2010  . CAD (coronary artery disease)     Catheterization, September, 2011,  grafts patent from redo CABG,, medical therapy of coronary disease,  consideration to proceeding with atrial flutter ablation  . Hx of CABG     CABG in 1995  //  redo CABG 2006  . Ejection fraction     EF 60%, echo, 2009  //   EF 65%, echo, September, 2011  . Mitral regurgitation     Mild, echo, September, 2011  . Muscle pain     Crestor,    Past Surgical History  Procedure Date  . Cardiac catheterization 2006    Nuclear..slight lateral ischemia..medical therapy  . Cardiac catheterization 08/04/2010    grafts patent from redo CABG...medical Rx and ablate Atrial flutter (LV not injected)   . Doppler echocardiography 08/2008    EF 60%  . Doppler echocardiography 08/02/2010    65-70%  . Doppler echocardiography 07/2010    MR mild  . Coronary artery bypass graft 1995     Redo  CABG 2006  . Gi bleeding 2009    Severe. multiple units of blood transfused..patient off aspirin  . Polypectomy   . Cataract extraction   . Vasectomy   . Tonsillectomy   . Carotid endarterectomy   . Coronary artery bypass graft   . Cholecystectomy     Patient Active Problem List  Diagnosis  . DIABETES MELLITUS, TYPE II  . DIABETIC PERIPHERAL NEUROPATHY  . HYPERLIPIDEMIA  . ANEMIA, SECONDARY TO ACUTE BLOOD LOSS  . ANEMIA  . HYPERTENSION  . ATRIAL FIBRILLATION  . Atrial flutter  . GASTROESOPHAGEAL REFLUX DISEASE  . DIVERTICULOSIS, COLON, WITH HEMORRHAGE  . MUSCLE CRAMPS, FOOT  . SLEEP APNEA  . EDEMA  . ALLERGY, ENVIRONMENTAL  . MACULAR DEGENERATION, HX OF  . COLONIC POLYPS, BENIGN, HX OF  . CAROTID ENDARTERECTOMY, HX OF  . POLYPECTOMY, HX OF  . CATARACT EXTRACTION, HX OF  . Other acquired absence of organ  . Prostate cancer screening  . Colon cancer screening  . Skin cancer  . CKD (chronic kidney disease) stage 3, GFR 30-59 ml/min  . Gout  . Epistaxis  . ED (erectile dysfunction)  . Hypogonadism male  . Medicare annual wellness visit, initial  . Neuropathy  . Carotid artery disease  . CAD (coronary artery disease)  . Hx of CABG  . GI bleeding  .  Ejection fraction  . Mitral regurgitation  . Muscle pain  . RBBB (right bundle branch block)    ROS   Patient denies fever, chills, headache, sweats, rash, change in vision, change in hearing, chest pain, cough, nausea vomiting, urinary symptoms. All other systems are reviewed and are negative.  PHYSICAL EXAM  He is overweight. He is oriented to person time and place. Affect is normal. There is no jugulovenous distention. There is a carotid bruit. Lungs are clear. Respiratory effort is nonlabored. Cardiac exam reveals S1 and S2. There no clicks or significant murmurs. The abdomen is soft. There is no peripheral edema. There are no musculoskeletal deformities. There are no skin rashes.  Filed Vitals:   10/09/12 1125  BP: 154/60  Pulse: 65  Height: 5\' 9"  (1.753 m)  Weight: 204 lb (92.534 kg)   EKG is done today and reviewed by me. There is sinus rhythm. There is old right bundle branch block. There is no change from the past.  ASSESSMENT & PLAN

## 2012-10-09 NOTE — Assessment & Plan Note (Signed)
The patient has significant carotid disease. He has had a right carotid endarterectomy. Most recent Doppler in November, 2013 reveals 40-59% bilateral disease

## 2012-10-09 NOTE — Assessment & Plan Note (Signed)
Right bundle branch block is old. No change in therapy. 

## 2012-10-25 ENCOUNTER — Ambulatory Visit (INDEPENDENT_AMBULATORY_CARE_PROVIDER_SITE_OTHER): Payer: Medicare Other | Admitting: General Practice

## 2012-10-25 DIAGNOSIS — I4892 Unspecified atrial flutter: Secondary | ICD-10-CM

## 2012-10-25 DIAGNOSIS — Z7901 Long term (current) use of anticoagulants: Secondary | ICD-10-CM

## 2012-10-25 DIAGNOSIS — I4891 Unspecified atrial fibrillation: Secondary | ICD-10-CM | POA: Diagnosis not present

## 2012-10-25 LAB — POCT INR: INR: 2.8

## 2012-10-31 ENCOUNTER — Emergency Department (HOSPITAL_COMMUNITY): Payer: Medicare Other

## 2012-10-31 ENCOUNTER — Encounter (HOSPITAL_COMMUNITY): Payer: Self-pay | Admitting: Physical Medicine and Rehabilitation

## 2012-10-31 ENCOUNTER — Telehealth: Payer: Self-pay

## 2012-10-31 ENCOUNTER — Emergency Department (HOSPITAL_COMMUNITY)
Admission: EM | Admit: 2012-10-31 | Discharge: 2012-11-01 | Disposition: A | Discharge status: Home / Self Care | Payer: Medicare Other | Attending: Emergency Medicine | Admitting: Emergency Medicine

## 2012-10-31 DIAGNOSIS — IMO0002 Reserved for concepts with insufficient information to code with codable children: Secondary | ICD-10-CM | POA: Insufficient documentation

## 2012-10-31 DIAGNOSIS — I129 Hypertensive chronic kidney disease with stage 1 through stage 4 chronic kidney disease, or unspecified chronic kidney disease: Secondary | ICD-10-CM | POA: Diagnosis not present

## 2012-10-31 DIAGNOSIS — E785 Hyperlipidemia, unspecified: Secondary | ICD-10-CM | POA: Diagnosis not present

## 2012-10-31 DIAGNOSIS — Z85828 Personal history of other malignant neoplasm of skin: Secondary | ICD-10-CM | POA: Insufficient documentation

## 2012-10-31 DIAGNOSIS — Z8679 Personal history of other diseases of the circulatory system: Secondary | ICD-10-CM | POA: Diagnosis not present

## 2012-10-31 DIAGNOSIS — Z860100 Personal history of colon polyps, unspecified: Secondary | ICD-10-CM | POA: Insufficient documentation

## 2012-10-31 DIAGNOSIS — Z8669 Personal history of other diseases of the nervous system and sense organs: Secondary | ICD-10-CM | POA: Insufficient documentation

## 2012-10-31 DIAGNOSIS — E1149 Type 2 diabetes mellitus with other diabetic neurological complication: Secondary | ICD-10-CM | POA: Diagnosis not present

## 2012-10-31 DIAGNOSIS — Z8701 Personal history of pneumonia (recurrent): Secondary | ICD-10-CM | POA: Diagnosis not present

## 2012-10-31 DIAGNOSIS — W1809XA Striking against other object with subsequent fall, initial encounter: Secondary | ICD-10-CM | POA: Insufficient documentation

## 2012-10-31 DIAGNOSIS — R040 Epistaxis: Secondary | ICD-10-CM | POA: Diagnosis not present

## 2012-10-31 DIAGNOSIS — Z87891 Personal history of nicotine dependence: Secondary | ICD-10-CM | POA: Diagnosis not present

## 2012-10-31 DIAGNOSIS — Z79899 Other long term (current) drug therapy: Secondary | ICD-10-CM | POA: Diagnosis not present

## 2012-10-31 DIAGNOSIS — S0510XA Contusion of eyeball and orbital tissues, unspecified eye, initial encounter: Secondary | ICD-10-CM | POA: Diagnosis not present

## 2012-10-31 DIAGNOSIS — Z8719 Personal history of other diseases of the digestive system: Secondary | ICD-10-CM | POA: Insufficient documentation

## 2012-10-31 DIAGNOSIS — Z7901 Long term (current) use of anticoagulants: Secondary | ICD-10-CM | POA: Insufficient documentation

## 2012-10-31 DIAGNOSIS — R011 Cardiac murmur, unspecified: Secondary | ICD-10-CM | POA: Diagnosis not present

## 2012-10-31 DIAGNOSIS — Z862 Personal history of diseases of the blood and blood-forming organs and certain disorders involving the immune mechanism: Secondary | ICD-10-CM | POA: Insufficient documentation

## 2012-10-31 DIAGNOSIS — I2510 Atherosclerotic heart disease of native coronary artery without angina pectoris: Secondary | ICD-10-CM | POA: Insufficient documentation

## 2012-10-31 DIAGNOSIS — G909 Disorder of the autonomic nervous system, unspecified: Secondary | ICD-10-CM | POA: Insufficient documentation

## 2012-10-31 DIAGNOSIS — Z8546 Personal history of malignant neoplasm of prostate: Secondary | ICD-10-CM | POA: Insufficient documentation

## 2012-10-31 DIAGNOSIS — W19XXXA Unspecified fall, initial encounter: Secondary | ICD-10-CM

## 2012-10-31 DIAGNOSIS — Y9389 Activity, other specified: Secondary | ICD-10-CM | POA: Insufficient documentation

## 2012-10-31 DIAGNOSIS — N183 Chronic kidney disease, stage 3 unspecified: Secondary | ICD-10-CM | POA: Insufficient documentation

## 2012-10-31 DIAGNOSIS — I252 Old myocardial infarction: Secondary | ICD-10-CM | POA: Diagnosis not present

## 2012-10-31 DIAGNOSIS — S0081XA Abrasion of other part of head, initial encounter: Secondary | ICD-10-CM

## 2012-10-31 DIAGNOSIS — Y92009 Unspecified place in unspecified non-institutional (private) residence as the place of occurrence of the external cause: Secondary | ICD-10-CM | POA: Insufficient documentation

## 2012-10-31 LAB — CBC WITH DIFFERENTIAL/PLATELET
Basophils Absolute: 0 10*3/uL (ref 0.0–0.1)
Basophils Relative: 0 % (ref 0–1)
Eosinophils Absolute: 0.4 10*3/uL (ref 0.0–0.7)
Eosinophils Relative: 6 % — ABNORMAL HIGH (ref 0–5)
HCT: 39.6 % (ref 39.0–52.0)
Lymphocytes Relative: 30 % (ref 12–46)
MCH: 30.1 pg (ref 26.0–34.0)
MCHC: 33.3 g/dL (ref 30.0–36.0)
MCV: 90.2 fL (ref 78.0–100.0)
Monocytes Absolute: 0.5 10*3/uL (ref 0.1–1.0)
Platelets: 168 10*3/uL (ref 150–400)
RDW: 13.4 % (ref 11.5–15.5)
WBC: 6.8 10*3/uL (ref 4.0–10.5)

## 2012-10-31 LAB — COMPREHENSIVE METABOLIC PANEL
ALT: 27 U/L (ref 0–53)
AST: 26 U/L (ref 0–37)
Calcium: 9.8 mg/dL (ref 8.4–10.5)
Creatinine, Ser: 1.9 mg/dL — ABNORMAL HIGH (ref 0.50–1.35)
GFR calc Af Amer: 37 mL/min — ABNORMAL LOW (ref >90)
GFR calc non Af Amer: 32 mL/min — ABNORMAL LOW (ref >90)
Sodium: 139 mEq/L (ref 135–145)
Total Protein: 7.9 g/dL (ref 6.0–8.3)

## 2012-10-31 LAB — PROTIME-INR: Prothrombin Time: 24.2 seconds — ABNORMAL HIGH (ref 11.6–15.2)

## 2012-10-31 NOTE — ED Notes (Signed)
Pt reports he tripped over some plants on Saturday, fell and hit forehead. Pt denies LOC/HA. Pt has small abraision to right forehead along with slight swelling to right eye. Pt takes Coumadin. Pt reports on Monday he noticed his right eye was bleeding, it was on and off all day then stopped. Pt c/o pressure between eyes when he is chewing along with a stuff nose. Pt reports he blew his nose today and had some bld of the tissue, denies any bld since then. Pt reports he called his PCP and was told to go to ED for a head CT.

## 2012-10-31 NOTE — Telephone Encounter (Signed)
Pt said rather than getting in the zoo at emergency room pt request Dr Damita Dunnings to call [facility] and get scan done there instead of going thru ER. Advised pt that with his symptoms on going since 10/27/12 and the pain from rt eye to lt eyebrow when chews or bites down; he needs to be evaluated at ED. Pt said he is in the car now on his way but wanted to check to see if could go to [facility] instead. Pt said he will go to  [facility] ED now.

## 2012-10-31 NOTE — ED Provider Notes (Signed)
History     CSN: [id number]  Arrival date & time 10/31/12  1539  First MD Initiated Contact with Patient 10/31/12 2014     Chief Complaint  Patient presents with  . Head Injury   HPI: Mr. Cantarella is a 78 yo CM with history of GERD, DM, HTN, HLD, CAD s/p CABG X 2, paroxysmal atrial fibrillation on chronic coumadin therapy, presents after falling four days ago. He suffered a mechanical fall when he tripped. He fell from standing height. Struck his face on concrete. No LOC or dizziness. He noted a small abrasion to forehead and epistaxis that resolved spontaneously. Two days ago he noted pain between his eyebrows only with chewing. Described as "like an ice cream headache," non-radiating, worsened with chewing, relieved with not chewing, mild. These symptoms have been improving and have almost resolved. Also noted blood coming from his eye two days as well. Would bleed for 2-3 hours slowly then resolve. Yesterday he noted a small amount of bleeding. Today he has not noticed any bleeding. He discussed these findings with his PCP and they recommended he come in for evaluation.   Past Medical History  Diagnosis Date  . GERD (gastroesophageal reflux disease)   . Anemia     Secondary to acute blood loss  . Allergy     Environmental  . Hypertension   . Hyperlipidemia   . Diabetes mellitus     Type II  . Diabetic peripheral neuropathy   . RBBB (right bundle branch block)   . Macular degeneration   . Sleep apnea   . Systolic murmur   . Carotid artery disease     Doppler 09/18/2009 - 49% bilateral stenoses  . Special screening for malignant neoplasm of prostate   . Personal history of colonic polyps   . Diverticulosis of colon with hemorrhage   . Special screening for malignant neoplasms of other sites   . Benign neoplasm of colon   . Erectile dysfunction     Mild  . Diverticulosis of colon (without mention of hemorrhage)   . Edema   . Myalgia   . PNA (pneumonia) 9/11    NSTEMI at [facility]  with repeat cath, rec medical mgmt   . Atrial flutter 07/2010    September, 2011   Hospital with PNA and cath done.Marland KitchenMarland KitchenCoumadin.  Atrial flutter ablation planned, but  pt. then had atrial fibrillation,/outpatient conversion 09/08/10..NSR..plan to follow..Dr. Caryl Comes  . GI bleeding     Off aspirin because of prior GI bleed, Severe, 2009, multiple units of blood transfused, patient taken off aspirin  . Chronic kidney disease     ckd #3  . Gout   . Skin cancer     R lower leg, per derm 2012  . Atrial fibrillation     Consideration was given for atrial flutter ablation, but patient developed atrial fibrillation. Cardioversion was done. Dr. Caryl Comes decided to watch him clinically. November, 2011  . Neuropathy     Diabetic  . Carotid artery disease     49% bilateral, Doppler, November, 2010  . CAD (coronary artery disease)     Catheterization, September, 2011,  grafts patent from redo CABG,, medical therapy of coronary disease, consideration to proceeding with atrial flutter ablation  . Hx of CABG     CABG in 1995  //   redo CABG 2006  . Ejection fraction     EF 60%, echo, 2009  //   EF 65%, echo, September, 2011  . Mitral regurgitation  Mild, echo, September, 2011  . Shoulder pain     Decreasing Crestor did not affect the pain   Past Surgical History  Procedure Date  . Cardiac catheterization 2006    Nuclear..slight lateral ischemia..medical therapy  . Cardiac catheterization 08/04/2010    grafts patent from redo CABG...medical Rx and ablate Atrial flutter (LV not injected)   . Doppler echocardiography 08/2008    EF 60%  . Doppler echocardiography 08/02/2010    65-70%  . Doppler echocardiography 07/2010    MR mild  . Coronary artery bypass graft 1995     Redo  CABG 2006  . Gi bleeding 2009    Severe. multiple units of blood transfused..patient off aspirin  . Polypectomy   . Cataract extraction   . Vasectomy   . Tonsillectomy   . Carotid endarterectomy   . Coronary artery bypass  graft   . Cholecystectomy     Family History  Problem Relation Age of Onset  . Kidney disease Mother     Kidney failure  . Stroke Mother   . Diabetes Mother   . Heart disease Father     MI  . Arthritis Sister   . Cancer Sister     Throat  . Heart disease Sister     MI  . Diabetes Sister   . Prostate cancer Neg Hx   . Colon cancer Neg Hx     History  Substance Use Topics  . Smoking status: Former Smoker    Quit date: 11/14/1958  . Smokeless tobacco: Not on file  . Alcohol Use: No     Review of Systems  Constitutional: Negative for chills, appetite change and fatigue.  HENT: Negative for trouble swallowing, neck pain and neck stiffness.   Eyes: Negative for photophobia and visual disturbance.  Respiratory: Negative for cough, shortness of breath and wheezing.   Cardiovascular: Negative for chest pain and leg swelling.  Gastrointestinal: Negative for nausea, vomiting and abdominal pain.  Genitourinary: Negative for dysuria, urgency and decreased urine volume.  Musculoskeletal: Negative for myalgias, back pain, arthralgias and gait problem.  Skin: Positive for wound (abrasion to forehead). Negative for pallor and rash.  Neurological: Positive for headaches (facial pain with chewing). Negative for dizziness, syncope, speech difficulty and weakness.  Psychiatric/Behavioral: Negative for confusion and agitation.  All other systems reviewed and are negative.   Allergies  Review of patient's allergies indicates no known allergies.  Home Medications   Current Outpatient Rx  Name  Route  Sig  Dispense  Refill  . ACETAMINOPHEN 500 MG PO TABS   Oral   Take 1,000 mg by mouth daily. For pain         . AMLODIPINE BESYLATE 10 MG PO TABS   Oral   Take 10 mg by mouth every evening.          [name] COLCHICINE 0.6 MG PO TABS   Oral   Take 0.6 mg by mouth 3 (three) times a week. For gout flare ups         . OMEGA-3 FATTY ACIDS 1000 MG PO CAPS   Oral   Take 2 g by mouth 2  (two) times daily.           . FUROSEMIDE 80 MG PO TABS   Oral   Take 1 tablet (80 mg total) by mouth daily.   90 tablet   1   . INSULIN GLARGINE 100 UNIT/ML [place] SOLN   Subcutaneous   Inject 30 Units into  the skin at bedtime. As directed, add 1 unit if AM sugar >120, dec 1 unit if your sugar is <80   40 mL   3   . MAGNESIUM OXIDE 400 MG PO TABS   Oral   Take 400 mg by mouth daily.          [name] METOPROLOL SUCCINATE ER 50 MG PO TB24   Oral   Take 1 tablet (50 mg total) by mouth daily.   90 tablet   1   . ADULT MULTIVITAMIN W/MINERALS CH   Oral   Take 1 tablet by mouth daily.         [name] POTASSIUM GLUCONATE 595 MG PO CAPS   Oral   Take 595 mg by mouth every evening.          [name] ROSUVASTATIN CALCIUM 20 MG PO TABS   Oral   Take 1 tablet (20 mg total) by mouth daily.   90 tablet   1   . WARFARIN SODIUM 7.5 MG PO TABS   Oral   Take 3.75-7.5 mg by mouth every evening. 1/2 tablet (3.75 mg) on Thursday and Saturday, 1 tablet (7.5 mg) on all other days         . INSULIN SYRINGE 31G X 5/16" 0.3 ML MISC      Use daily as instructed.            BP 154/72  Pulse 69  Temp 97.9 F (36.6 C) (Oral)  Resp 16  SpO2 99%  Physical Exam  Nursing note and vitals reviewed. Constitutional: He is oriented to person, place, and time. He appears well-developed and well-nourished. He is cooperative. No distress.  HENT:  Head: Normocephalic. Head is with abrasion (right side of forehead).  Mouth/Throat: Oropharynx is clear and moist and mucous membranes are normal.  Eyes: Conjunctivae normal and EOM are normal. Pupils are equal, round, and reactive to light.  Neck: Trachea normal. Neck supple. No JVD present.  Cardiovascular: Normal rate, regular rhythm, S1 normal, S2 normal and normal heart sounds.  Exam reveals no decreased pulses.   Pulmonary/Chest: Effort normal and breath sounds normal. He has no decreased breath sounds.  Abdominal: Soft. Normal appearance and bowel sounds  are normal. There is no tenderness. There is no CVA tenderness.  Musculoskeletal: Normal range of motion. He exhibits no edema.  Neurological: He is alert and oriented to person, place, and time. He has normal strength and normal reflexes. No cranial nerve deficit or sensory deficit. Gait normal.  Skin: Skin is warm and dry.   ED Course  Procedures  Labs Reviewed  CBC WITH DIFFERENTIAL - Abnormal; Notable for the following:    Eosinophils Relative 6 (*)     All other components within normal limits  COMPREHENSIVE METABOLIC PANEL - Abnormal; Notable for the following:    Glucose, Bld 177 (*)     BUN 32 (*)     Creatinine, Ser 1.90 (*)     GFR calc non Af Amer 32 (*)     GFR calc Af Amer 37 (*)     All other components within normal limits   Ct Head Wo Contrast  11/01/2012  *RADIOLOGY REPORT*  Clinical Data: Recent fall, periorbital pain.  CT HEAD WITHOUT CONTRAST  Technique:  Contiguous axial images were obtained from the base of the skull through the vertex without contrast.  Comparison: None.  Findings: There is no evidence for acute hemorrhage, hydrocephalus, mass lesion, or abnormal extra-axial fluid collection.  No definite CT evidence for acute infarction.  Mild cerebral and cerebellar volume loss. The visualized paranasal sinuses and mastoid air cells are predominately clear.  No displaced calvarial fracture.  IMPRESSION: Mild volume loss.  No CT evidence for acute intracranial abnormality.   Original Report Authenticated By: Carlos Levering, M.D.    Ct Orbitss W/o Cm  11/01/2012  *RADIOLOGY REPORT*  Clinical Data: Recent fall, pain in the right orbit.  CT ORBITS WITHOUT CONTRAST  Technique:  Multidetector CT imaging of the orbits was performed following the standard protocol without intravenous contrast.  Comparison: Contemporaneous head CT  Findings: The lenses are replaced, in expected location.  Globes are symmetric.  No retrobulbar hematoma. Symmetric extraocular muscles.  There  are no periorbital fluid collection.  There is a tiny radiopaque density within the soft tissues superior to the right lobe on series 5 image 30.  Intact orbital and sinus walls.  Intact nasal bones, nasal septum, pterygoid plates, and zygomatic arches.  Located temporomandibular joints.  IMPRESSION: Symmetric globes.  No retrobulbar hematoma.  No periorbital loculated fluid collection.  There is a tiny linear density within the soft tissues at the superior margin of the right orbit.  This may correspond to a skin calcification or tiny foreign body.  Correlate with direct inspection.   Original Report Authenticated By: Carlos Levering, M.D.    1. Fall   2. Forehead abrasion    MDM  78 yo CM with history of GERD, DM, HTN, HLD, CAD s/p CABG X 2, paroxysmal atrial fibrillation on chronic coumadin therapy, presents after falling four days ago. Afebrile, vital signs stable. Normal neurologic exam. INR 2.29. Obtained head and orbit CT without contrast which did not show acute abnormality including fracture or ICH. Unknown cause of bleeding from eye but no apparent globe injury, normal vision, PERRL. All symptoms are improving so feel no further imaging required at this time. He was ambulatory and without complaint prior to DC. Given strict return precautions. Patient in agreement with plan.   Reviewed imaging, labs and previous medical records, utilized in MDM  Discussed case with Dr. Roxanne Mins  Clinical Impression 1. Forehead abrasion 2. Mechanical fall         Louretta Shorten, MD 11/01/12 [phone]

## 2012-10-31 NOTE — Telephone Encounter (Addendum)
Pt fell on 10/27/12 at 9 pm, tripped over planter on driveway and hit forehead above rt eye on cement driveway. Pt did not lose consciousness.Pt has 1 1/2 " bruise across forehead on rt eye. Pt sore all over body on 10/28/12.On 10/29/12 noticed blood  coming out of corner of rt eye ? Tear duct. Blood not draining constantly. Right now no bleeding at all; bleeding comes and goes. When chewing or bites down on food has pain shoots from  corner of rt eye across to left eyebrow. Phones and computer went down; Dr Damita Dunnings advised pt to go to ED for evaluation. Called pt on my cell and advised needs to go to ER for evaluation. Pt said he will go to [facility] ER now.

## 2012-10-31 NOTE — Telephone Encounter (Signed)
I called pt and reinforced the importance of him being evaluated; pt said he is at [facility] ER now getting checked in and appreciates Dr Josefine Class concern.

## 2012-10-31 NOTE — Telephone Encounter (Signed)
Agreed, thanks to all involved.

## 2012-10-31 NOTE — Telephone Encounter (Signed)
I would go to ER and have them check him and potentially run the CT there. If he did have a fracture, he'd likely need to be seen at the ER anyway.  I am concerned for a fracture.

## 2012-10-31 NOTE — ED Notes (Signed)
Pt presents to department for evaluation of head injury after fall. States he tripped and fell last Saturday at home. Now states abrasion to forehead, jaw pain and facial pain with chewing. Also states he has noticed blood from R eye. Denies pain at the time. He is conscious alert and oriented x4.

## 2012-11-01 NOTE — ED Provider Notes (Signed)
78 year old male on warfarin fell 4 days ago hitting his head. He noted some blood coming out from his right eye so he was referred here for further evaluation. There is no loss of consciousness he denies any dizziness or incoordination or nausea or vomiting. On exam, he is neurologically intact. The CT scan shows a calcification in the soft tissues around the eye but no acute injury. He is reassured and discharged.   I saw and evaluated the patient, reviewed the resident's note and I agree with the findings and plan.   Delora Fuel, MD [id number]

## 2012-11-01 NOTE — ED Notes (Signed)
Pt discharged and vital signs stable and GCS 15.

## 2012-11-22 ENCOUNTER — Ambulatory Visit (INDEPENDENT_AMBULATORY_CARE_PROVIDER_SITE_OTHER): Payer: Medicare Other | Admitting: General Practice

## 2012-11-22 DIAGNOSIS — I4891 Unspecified atrial fibrillation: Secondary | ICD-10-CM

## 2012-11-22 DIAGNOSIS — I4892 Unspecified atrial flutter: Secondary | ICD-10-CM

## 2012-11-22 LAB — POCT INR: INR: 2.2

## 2012-12-06 ENCOUNTER — Other Ambulatory Visit: Payer: Self-pay

## 2012-12-06 MED ORDER — FUROSEMIDE 80 MG PO TABS: 80.0000 mg | ORAL_TABLET | Freq: Every day | ORAL | Status: DC

## 2012-12-06 MED ORDER — ROSUVASTATIN CALCIUM 20 MG PO TABS: 20.0000 mg | ORAL_TABLET | Freq: Every day | ORAL | Status: DC

## 2012-12-06 MED ORDER — METOPROLOL SUCCINATE ER 50 MG PO TB24: 50.0000 mg | ORAL_TABLET | Freq: Every day | ORAL | Status: DC

## 2012-12-11 ENCOUNTER — Telehealth: Payer: Self-pay | Admitting: Cardiology

## 2012-12-11 DIAGNOSIS — N2581 Secondary hyperparathyroidism of renal origin: Secondary | ICD-10-CM | POA: Diagnosis not present

## 2012-12-11 MED ORDER — METOPROLOL SUCCINATE ER 50 MG PO TB24: 50.0000 mg | ORAL_TABLET | Freq: Every day | ORAL | Status: DC

## 2012-12-11 MED ORDER — AMLODIPINE BESYLATE 10 MG PO TABS: 10.0000 mg | ORAL_TABLET | Freq: Every evening | ORAL | Status: DC

## 2012-12-11 NOTE — Telephone Encounter (Signed)
Pt needs refill of amlodipine and metoprolol, [facility] 90 day supply

## 2012-12-19 DIAGNOSIS — N2581 Secondary hyperparathyroidism of renal origin: Secondary | ICD-10-CM | POA: Diagnosis not present

## 2012-12-19 DIAGNOSIS — I129 Hypertensive chronic kidney disease with stage 1 through stage 4 chronic kidney disease, or unspecified chronic kidney disease: Secondary | ICD-10-CM | POA: Diagnosis not present

## 2012-12-19 DIAGNOSIS — D631 Anemia in chronic kidney disease: Secondary | ICD-10-CM | POA: Diagnosis not present

## 2012-12-20 ENCOUNTER — Ambulatory Visit (INDEPENDENT_AMBULATORY_CARE_PROVIDER_SITE_OTHER): Payer: Medicare Other | Admitting: General Practice

## 2012-12-20 DIAGNOSIS — I4891 Unspecified atrial fibrillation: Secondary | ICD-10-CM

## 2012-12-20 DIAGNOSIS — I4892 Unspecified atrial flutter: Secondary | ICD-10-CM | POA: Diagnosis not present

## 2013-01-01 ENCOUNTER — Emergency Department (INDEPENDENT_AMBULATORY_CARE_PROVIDER_SITE_OTHER)
Admission: EM | Admit: 2013-01-01 | Discharge: 2013-01-01 | Disposition: A | Discharge status: Home / Self Care | Payer: Medicare Other | Source: Home / Self Care | Attending: Family Medicine | Admitting: Family Medicine

## 2013-01-01 ENCOUNTER — Telehealth: Payer: Self-pay | Admitting: Family Medicine

## 2013-01-01 ENCOUNTER — Emergency Department (INDEPENDENT_AMBULATORY_CARE_PROVIDER_SITE_OTHER): Payer: Medicare Other

## 2013-01-01 ENCOUNTER — Encounter (HOSPITAL_COMMUNITY): Payer: Self-pay | Admitting: *Deleted

## 2013-01-01 DIAGNOSIS — J111 Influenza due to unidentified influenza virus with other respiratory manifestations: Secondary | ICD-10-CM

## 2013-01-01 DIAGNOSIS — J40 Bronchitis, not specified as acute or chronic: Secondary | ICD-10-CM | POA: Diagnosis not present

## 2013-01-01 NOTE — Telephone Encounter (Signed)
Patient Information:  Caller Name: [place]  Phone: [phone]  Patient: [name], [name]  Gender: Male  DOB: 05-20-1932  Age: 79 Years  PCP: [name] Brigitte Pulse) [facility])  Office Follow Up:  Does the office need to follow up with this patient?: No  Instructions For The Office: N/A  RN Note:  Caller is drinking and voiding within normal limits. Decreased appetite and energy level. No appointments available, RN advised evaluation at [facility] per nursing judgement. Caller agrees to plan.  Symptoms  Reason For Call & Symptoms: Coughing up yellow and brown mucus, lungs burn when coughing, sore throat  Reviewed Health History In EMR: Yes  Reviewed Medications In EMR: Yes  Reviewed Allergies In EMR: Yes  Reviewed Surgeries / Procedures: Yes  Date of Onset of Symptoms: 12/30/2012  Treatments Tried: mucinex  Treatments Tried Worked: No  Any Fever: Yes  Fever Taken: Oral  Fever Time Of Reading: 15:30:00  Fever Last Reading: 103  Guideline(s) Used:  Cough  Disposition Per Guideline:   Go to Office Now  Reason For Disposition Reached:   Fever > 103 F (39.4 C)  Advice Given:  Fever Medicines:  Acetaminophen (e.g., Tylenol):  Regular Strength Tylenol: Take 650 mg (two 325 mg pills) by mouth every 4-6 hours as needed. Each Regular Strength Tylenol pill has 325 mg of acetaminophen.  Extra Strength Tylenol: Take 1,000 mg (two 500 mg pills) every 8 hours as needed. Each Extra Strength Tylenol pill has 500 mg of acetaminophen.  The most you should take each day is 3,000 mg (10 Regular Strength or 6 Extra Strength pills a day).  Call Back If:  Difficulty breathing  Cough lasts more than 3 weeks  Fever lasts > 3 days  You become worse.  Prevent Dehydration:  Drink adequate liquids.  This will help soothe an irritated or dry throat and loosen up the phlegm.  RN Overrode Recommendation:  Go To [facility].  No appointments available to 01/01/13- after 1600.

## 2013-01-01 NOTE — ED Provider Notes (Signed)
History     CSN: [id number]  Arrival date & time 01/01/13  1629   First MD Initiated Contact with Patient 01/01/13 1633      Chief Complaint  Patient presents with  . Cough    (Consider location/radiation/quality/duration/timing/severity/associated sxs/prior treatment) Patient is a 79 y.o. male presenting with cough. The history is provided by the patient and a friend.  Cough Cough characteristics:  Productive Sputum characteristics:  Yellow Severity:  Mild Progression:  Worsening Chronicity:  New Smoker: no   Associated symptoms: fever   Associated symptoms: no shortness of breath     Past Medical History  Diagnosis Date  . GERD (gastroesophageal reflux disease)   . Anemia     Secondary to acute blood loss  . Allergy     Environmental  . Hypertension   . Hyperlipidemia   . Diabetes mellitus     Type II  . Diabetic peripheral neuropathy   . RBBB (right bundle branch block)   . Macular degeneration   . Sleep apnea   . Systolic murmur   . Carotid artery disease     Doppler 09/18/2009 - 49% bilateral stenoses  . Special screening for malignant neoplasm of prostate   . Personal history of colonic polyps   . Diverticulosis of colon with hemorrhage   . Special screening for malignant neoplasms of other sites   . Benign neoplasm of colon   . Erectile dysfunction     Mild  . Diverticulosis of colon (without mention of hemorrhage)   . Edema   . Myalgia   . PNA (pneumonia) 9/11    NSTEMI at [facility] with repeat cath, rec medical mgmt   . Atrial flutter 07/2010    September, 2011   Hospital with PNA and cath done.Marland KitchenMarland KitchenCoumadin.  Atrial flutter ablation planned, but  pt. then had atrial fibrillation,/outpatient conversion 09/08/10..NSR..plan to follow..Dr. Caryl Comes  . GI bleeding     Off aspirin because of prior GI bleed, Severe, 2009, multiple units of blood transfused, patient taken off aspirin  . Chronic kidney disease     ckd #3  . Gout   . Skin cancer     R lower leg, per  derm 2012  . Atrial fibrillation     Consideration was given for atrial flutter ablation, but patient developed atrial fibrillation. Cardioversion was done. Dr. Caryl Comes decided to watch him clinically. November, 2011  . Neuropathy     Diabetic  . Carotid artery disease     49% bilateral, Doppler, November, 2010  . CAD (coronary artery disease)     Catheterization, September, 2011,  grafts patent from redo CABG,, medical therapy of coronary disease, consideration to proceeding with atrial flutter ablation  . Hx of CABG     CABG in 1995  //   redo CABG 2006  . Ejection fraction     EF 60%, echo, 2009  //   EF 65%, echo, September, 2011  . Mitral regurgitation     Mild, echo, September, 2011  . Shoulder pain     Decreasing Crestor did not affect the pain    Past Surgical History  Procedure Laterality Date  . Cardiac catheterization  2006    Nuclear..slight lateral ischemia..medical therapy  . Cardiac catheterization  08/04/2010    grafts patent from redo CABG...medical Rx and ablate Atrial flutter (LV not injected)   . Doppler echocardiography  08/2008    EF 60%  . Doppler echocardiography  08/02/2010    65-70%  . Doppler  echocardiography  07/2010    MR mild  . Coronary artery bypass graft  1995     Redo  CABG 2006  . Gi bleeding  2009    Severe. multiple units of blood transfused..patient off aspirin  . Polypectomy    . Cataract extraction    . Vasectomy    . Tonsillectomy    . Carotid endarterectomy    . Coronary artery bypass graft    . Cholecystectomy      Family History  Problem Relation Age of Onset  . Kidney disease Mother     Kidney failure  . Stroke Mother   . Diabetes Mother   . Heart disease Father     MI  . Arthritis Sister   . Cancer Sister     Throat  . Heart disease Sister     MI  . Diabetes Sister   . Prostate cancer Neg Hx   . Colon cancer Neg Hx     History  Substance Use Topics  . Smoking status: Former Smoker    Quit date: 11/14/1958  .  Smokeless tobacco: Not on file  . Alcohol Use: No      Review of Systems  Constitutional: Positive for fever.  HENT: Negative.   Respiratory: Positive for cough. Negative for chest tightness and shortness of breath.   Cardiovascular: Negative.     Allergies  Review of patient's allergies indicates no known allergies.  Home Medications   Current Outpatient Rx  Name  Route  Sig  Dispense  Refill  . acetaminophen (TYLENOL) 500 MG tablet   Oral   Take 1,000 mg by mouth daily. For pain         . amLODipine (NORVASC) 10 MG tablet   Oral   Take 1 tablet (10 mg total) by mouth every evening.   90 tablet   2   . colchicine 0.6 MG tablet   Oral   Take 0.6 mg by mouth 3 (three) times a week. For gout flare ups         . fish oil-omega-3 fatty acids 1000 MG capsule   Oral   Take 2 g by mouth 2 (two) times daily.           . furosemide (LASIX) 80 MG tablet   Oral   Take 1 tablet (80 mg total) by mouth daily.   90 tablet   1   . insulin glargine (LANTUS) 100 UNIT/ML injection   Subcutaneous   Inject 30 Units into the skin at bedtime. As directed, add 1 unit if AM sugar >120, dec 1 unit if your sugar is <80   40 mL   3   . Insulin Syringe-Needle U-100 (INSULIN SYRINGE .3CC/31GX5/16") 31G X 5/16" 0.3 ML MISC      Use daily as instructed.          . magnesium oxide (MAG-OX) 400 MG tablet   Oral   Take 400 mg by mouth daily.          . metoprolol succinate (TOPROL-XL) 50 MG 24 hr tablet   Oral   Take 1 tablet (50 mg total) by mouth daily.   90 tablet   2   . Multiple Vitamin (MULTIVITAMIN WITH MINERALS) TABS   Oral   Take 1 tablet by mouth daily.         . Potassium Gluconate (K-99) 595 MG CAPS   Oral   Take 595 mg by mouth every evening.          [name]  rosuvastatin (CRESTOR) 20 MG tablet   Oral   Take 1 tablet (20 mg total) by mouth daily.   90 tablet   1   . warfarin (COUMADIN) 7.5 MG tablet   Oral   Take 3.75-7.5 mg by mouth every evening.  1/2 tablet (3.75 mg) on Thursday and Saturday, 1 tablet (7.5 mg) on all other days           BP 120/62  Pulse 93  Temp(Src) 98.8 F (37.1 C) (Oral)  SpO2 96%  Physical Exam  Nursing note and vitals reviewed. Constitutional: He is oriented to person, place, and time. He appears well-developed and well-nourished.  HENT:  Head: Normocephalic.  Right Ear: External ear normal.  Left Ear: External ear normal.  Mouth/Throat: Oropharynx is clear and moist.  Eyes: Pupils are equal, round, and reactive to light.  Neck: Normal range of motion. Neck supple.  Cardiovascular: Normal rate, regular rhythm, normal heart sounds and intact distal pulses.   Pulmonary/Chest: Effort normal and breath sounds normal.  Lymphadenopathy:    He has no cervical adenopathy.  Neurological: He is alert and oriented to person, place, and time.  Skin: Skin is warm and dry.    ED Course  Procedures (including critical care time)  Labs Reviewed - No data to display Dg Chest 2 View  01/01/2013  *RADIOLOGY REPORT*  Clinical Data: Cough, fever  CHEST - 2 VIEW  Comparison: Prior chest x-ray 08/01/2010  Findings: Stable linear atelectasis versus scar in the lingula. The lungs are otherwise clear.  Pulmonary hyperexpansion with mild central bronchitic changes consistent with underlying COPD.  The cardiac and mediastinal contours within normal limits.  The patient is status post median sternotomy with evidence of prior multivessel CABG.  Aortic atherosclerosis is noted.  Surgical clips project over the soft tissues of the right neck.  No acute osseous abnormality.  IMPRESSION:  1.  No acute cardiopulmonary disease, specifically no pneumonia. 2.  Background COPD.   Original Report Authenticated By: Jacqulynn Cadet, M.D.      1. Influenza-like illness       MDM          Billy Fischer, MD 01/01/13 [phone]

## 2013-01-01 NOTE — ED Notes (Signed)
Pt       Reports       Symptoms        Of  Cough     And  Fever               Pt  Reports  Pain  When  He         Coughs   With  Yellow   Sputum  Production       Symptoms  X  2  Days            Pt  Alert  And  Oriented

## 2013-01-07 ENCOUNTER — Other Ambulatory Visit: Payer: Self-pay | Admitting: Family Medicine

## 2013-01-07 NOTE — Telephone Encounter (Signed)
Electronic refill request.  Please advise. 

## 2013-01-08 NOTE — Telephone Encounter (Signed)
Sent!

## 2013-01-10 ENCOUNTER — Ambulatory Visit (INDEPENDENT_AMBULATORY_CARE_PROVIDER_SITE_OTHER): Payer: Medicare Other | Admitting: General Practice

## 2013-01-10 DIAGNOSIS — Z5181 Encounter for therapeutic drug level monitoring: Secondary | ICD-10-CM

## 2013-01-10 DIAGNOSIS — I4892 Unspecified atrial flutter: Secondary | ICD-10-CM | POA: Diagnosis not present

## 2013-01-10 DIAGNOSIS — Z7901 Long term (current) use of anticoagulants: Secondary | ICD-10-CM

## 2013-01-10 DIAGNOSIS — I4891 Unspecified atrial fibrillation: Secondary | ICD-10-CM | POA: Diagnosis not present

## 2013-01-18 ENCOUNTER — Other Ambulatory Visit: Payer: Self-pay | Admitting: Family Medicine

## 2013-02-07 ENCOUNTER — Ambulatory Visit (INDEPENDENT_AMBULATORY_CARE_PROVIDER_SITE_OTHER): Payer: Medicare Other | Admitting: General Practice

## 2013-02-07 DIAGNOSIS — Z5181 Encounter for therapeutic drug level monitoring: Secondary | ICD-10-CM | POA: Diagnosis not present

## 2013-02-07 DIAGNOSIS — Z7901 Long term (current) use of anticoagulants: Secondary | ICD-10-CM | POA: Diagnosis not present

## 2013-02-07 DIAGNOSIS — I4891 Unspecified atrial fibrillation: Secondary | ICD-10-CM

## 2013-02-07 DIAGNOSIS — I4892 Unspecified atrial flutter: Secondary | ICD-10-CM | POA: Diagnosis not present

## 2013-03-07 ENCOUNTER — Other Ambulatory Visit: Payer: Self-pay | Admitting: General Practice

## 2013-03-07 ENCOUNTER — Ambulatory Visit (INDEPENDENT_AMBULATORY_CARE_PROVIDER_SITE_OTHER): Payer: Medicare Other | Admitting: General Practice

## 2013-03-07 DIAGNOSIS — I4892 Unspecified atrial flutter: Secondary | ICD-10-CM | POA: Diagnosis not present

## 2013-03-07 DIAGNOSIS — Z7901 Long term (current) use of anticoagulants: Secondary | ICD-10-CM | POA: Diagnosis not present

## 2013-03-07 DIAGNOSIS — I4891 Unspecified atrial fibrillation: Secondary | ICD-10-CM | POA: Diagnosis not present

## 2013-03-07 DIAGNOSIS — Z5181 Encounter for therapeutic drug level monitoring: Secondary | ICD-10-CM

## 2013-03-07 LAB — POCT INR: INR: 2.7

## 2013-03-07 MED ORDER — INSULIN GLARGINE 100 UNIT/ML ~~LOC~~ SOLN: SUBCUTANEOUS | Status: DC

## 2013-03-08 ENCOUNTER — Other Ambulatory Visit (INDEPENDENT_AMBULATORY_CARE_PROVIDER_SITE_OTHER): Payer: Medicare Other

## 2013-03-08 DIAGNOSIS — E119 Type 2 diabetes mellitus without complications: Secondary | ICD-10-CM | POA: Diagnosis not present

## 2013-03-08 NOTE — Addendum Note (Signed)
Addended by: Marchia Bond on: 03/08/2013 10:16 AM   Modules accepted: Orders

## 2013-03-12 ENCOUNTER — Ambulatory Visit (INDEPENDENT_AMBULATORY_CARE_PROVIDER_SITE_OTHER): Payer: Medicare Other | Admitting: Family Medicine

## 2013-03-12 ENCOUNTER — Encounter: Payer: Self-pay | Admitting: Family Medicine

## 2013-03-12 VITALS — BP 156/66 | HR 70 | Temp 98.0°F | Ht 69.0 in | Wt 206.0 lb

## 2013-03-12 DIAGNOSIS — M1990 Unspecified osteoarthritis, unspecified site: Secondary | ICD-10-CM

## 2013-03-12 DIAGNOSIS — E119 Type 2 diabetes mellitus without complications: Secondary | ICD-10-CM

## 2013-03-12 DIAGNOSIS — Z0000 Encounter for general adult medical examination without abnormal findings: Secondary | ICD-10-CM

## 2013-03-12 DIAGNOSIS — Z1331 Encounter for screening for depression: Secondary | ICD-10-CM | POA: Diagnosis not present

## 2013-03-12 DIAGNOSIS — I10 Essential (primary) hypertension: Secondary | ICD-10-CM

## 2013-03-12 DIAGNOSIS — E785 Hyperlipidemia, unspecified: Secondary | ICD-10-CM

## 2013-03-12 MED ORDER — TRAMADOL HCL 50 MG PO TABS: 50.0000 mg | ORAL_TABLET | Freq: Two times a day (BID) | ORAL | Status: DC | PRN

## 2013-03-12 MED ORDER — INSULIN GLARGINE 100 UNIT/ML ~~LOC~~ SOLN: 30.0000 [IU] | Freq: Every day | SUBCUTANEOUS | Status: DC

## 2013-03-12 NOTE — Progress Notes (Signed)
I have personally reviewed the Medicare Annual Wellness questionnaire and have noted 1. The patient's medical and social history 2. Their use of alcohol, tobacco or illicit drugs 3. Their current medications and supplements 4. The patient's functional ability including ADL's, fall risks, home safety risks and hearing or visual             impairment. 5. Diet and physical activities 6. Evidence for depression or mood disorders  The patients weight, height, BMI have been recorded in the chart and visual acuity is per eye clinic.  I have made referrals, counseling and provided education to the patient based review of the above and I have provided the pt with a written personalized care plan for preventive services.  See scanned forms.  Routine anticipatory guidance given to patient.  See health maintenance. Flu 2013 Shingles 2008 PNA 2003 Tetanus 2010 Colonoscopy 2009 and deferred for now given CKD and coumadin use.  D/w pt.  He agrees.  Prostate cancer screening n/a, dw pt. Advance directive dw pt.  Daughter Virgilio Frees designated if incapacitated. Cognitive function addressed- see scanned forms- and if abnormal then additional documentation follows.   CKD #3 per renal.  He has routine f/u planned.   H/o AF, no palpitations and is doing well with coumadin.  He has no sig bleeding.  No CP, SOB, BLE edema.  B knees and ankles stiff in AM.  No trauma. Likely OA, d/w pt.  Taking tylenol. Avoiding NSAIDS. Pain with going up steps.   Diabetes:  Using medications without difficulties:yes Hypoglycemic episodes:no Hyperglycemic episodes:no Feet problems:no Blood Sugars averaging: ~140 in AM, A1c 7. 2  PMH and SH reviewed  Meds, vitals, and allergies reviewed.   ROS: See HPI.  Otherwise negative.    GEN: nad, alert and oriented HEENT: mucous membranes moist NECK: supple w/o LA CV: rrr. PULM: ctab, no inc wob ABD: soft, +bs EXT: no edema SKIN: no acute rash B knee exam w/o  erythema or bruising but crepitus noted on ROM.  ACL/MCL/LCL feel solid B.

## 2013-03-12 NOTE — Patient Instructions (Addendum)
Call about the hearing aid check. Talk to cardiology about the coumadin. I sent a note so they should be in touch with you. Take the tramadol twice a day with the tylenol for pain and see if that helps your ankles and knees.  Recheck labs in 3 months before a visit.  Keep working on your weight and try to exercise more.  Take care.  Glad to see you.

## 2013-03-14 DIAGNOSIS — M1990 Unspecified osteoarthritis, unspecified site: Secondary | ICD-10-CM | POA: Insufficient documentation

## 2013-03-14 NOTE — Assessment & Plan Note (Signed)
No change in meds. He'll continue to work on diet and weight.

## 2013-03-14 NOTE — Assessment & Plan Note (Signed)
I didn't adjust his meds today with elevated systolic as his DBP was low. He isn't orthostatic. Continue as is. He agrees.

## 2013-03-14 NOTE — Assessment & Plan Note (Signed)
Recheck lipids later in 2014.  He agrees. Continue statin for now.

## 2013-03-14 NOTE — Assessment & Plan Note (Signed)
See scanned forms.  Routine anticipatory guidance given to patient.  See health maintenance. Flu 2013 Shingles 2008 PNA 2003 Tetanus 2010 Colonoscopy 2009 and deferred for now given CKD and coumadin use.  D/w pt.  He agrees.  Prostate cancer screening n/a, dw pt. Advance directive dw pt.  Daughter Virgilio Frees designated if incapacitated. Cognitive function addressed- see scanned forms- and if abnormal then additional documentation follows.

## 2013-03-14 NOTE — Assessment & Plan Note (Signed)
Very likely cause of knee and foot pain.  Would add on tramadol, continue tylenol and f/u prn.  He agrees. We didn't image today and it wouldn't likely change mgmt.

## 2013-04-03 ENCOUNTER — Other Ambulatory Visit: Payer: Self-pay | Admitting: General Practice

## 2013-04-03 ENCOUNTER — Other Ambulatory Visit: Payer: Self-pay | Admitting: Family Medicine

## 2013-04-03 MED ORDER — WARFARIN SODIUM 7.5 MG PO TABS: ORAL_TABLET | ORAL | Status: DC

## 2013-04-04 ENCOUNTER — Ambulatory Visit (INDEPENDENT_AMBULATORY_CARE_PROVIDER_SITE_OTHER): Payer: Medicare Other | Admitting: General Practice

## 2013-04-04 ENCOUNTER — Ambulatory Visit: Payer: Medicare Other

## 2013-04-04 DIAGNOSIS — I4891 Unspecified atrial fibrillation: Secondary | ICD-10-CM

## 2013-04-04 DIAGNOSIS — I4892 Unspecified atrial flutter: Secondary | ICD-10-CM

## 2013-04-04 DIAGNOSIS — Z5181 Encounter for therapeutic drug level monitoring: Secondary | ICD-10-CM | POA: Diagnosis not present

## 2013-04-04 DIAGNOSIS — Z7901 Long term (current) use of anticoagulants: Secondary | ICD-10-CM

## 2013-04-11 DIAGNOSIS — D649 Anemia, unspecified: Secondary | ICD-10-CM | POA: Diagnosis not present

## 2013-04-12 DIAGNOSIS — D235 Other benign neoplasm of skin of trunk: Secondary | ICD-10-CM | POA: Diagnosis not present

## 2013-04-12 DIAGNOSIS — B009 Herpesviral infection, unspecified: Secondary | ICD-10-CM | POA: Diagnosis not present

## 2013-04-12 DIAGNOSIS — C44319 Basal cell carcinoma of skin of other parts of face: Secondary | ICD-10-CM | POA: Diagnosis not present

## 2013-04-12 DIAGNOSIS — L570 Actinic keratosis: Secondary | ICD-10-CM | POA: Diagnosis not present

## 2013-04-18 DIAGNOSIS — E119 Type 2 diabetes mellitus without complications: Secondary | ICD-10-CM | POA: Diagnosis not present

## 2013-04-19 DIAGNOSIS — N2581 Secondary hyperparathyroidism of renal origin: Secondary | ICD-10-CM | POA: Diagnosis not present

## 2013-04-19 DIAGNOSIS — D649 Anemia, unspecified: Secondary | ICD-10-CM | POA: Diagnosis not present

## 2013-04-19 DIAGNOSIS — I129 Hypertensive chronic kidney disease with stage 1 through stage 4 chronic kidney disease, or unspecified chronic kidney disease: Secondary | ICD-10-CM | POA: Diagnosis not present

## 2013-04-25 ENCOUNTER — Encounter: Payer: Self-pay | Admitting: Family Medicine

## 2013-04-25 ENCOUNTER — Other Ambulatory Visit: Payer: Self-pay

## 2013-04-25 LAB — BUN
BUN: 27 mg/dL — AB (ref 4–21)
Creatinine, Ser: 1.78

## 2013-04-25 MED ORDER — ROSUVASTATIN CALCIUM 20 MG PO TABS: 20.0000 mg | ORAL_TABLET | Freq: Every day | ORAL | Status: DC

## 2013-04-25 MED ORDER — FUROSEMIDE 80 MG PO TABS: 80.0000 mg | ORAL_TABLET | Freq: Every day | ORAL | Status: DC

## 2013-04-25 NOTE — Telephone Encounter (Signed)
HYPERLIPIDEMIA - Jeremiah Bjornstad, MD at 10/09/2012 12:06 PM  Status: Written Related Problem: HYPERLIPIDEMIA  The patient had lowered his Crestor dose to see if it helped with the aching in the shoulder. This did not affect the aching in the shoulder. He will resume his daily Crestor dose.

## 2013-05-01 ENCOUNTER — Encounter (INDEPENDENT_AMBULATORY_CARE_PROVIDER_SITE_OTHER): Payer: Medicare Other | Admitting: Ophthalmology

## 2013-05-01 DIAGNOSIS — I10 Essential (primary) hypertension: Secondary | ICD-10-CM | POA: Diagnosis not present

## 2013-05-01 DIAGNOSIS — E11319 Type 2 diabetes mellitus with unspecified diabetic retinopathy without macular edema: Secondary | ICD-10-CM

## 2013-05-01 DIAGNOSIS — H4420 Degenerative myopia, unspecified eye: Secondary | ICD-10-CM

## 2013-05-01 DIAGNOSIS — E1139 Type 2 diabetes mellitus with other diabetic ophthalmic complication: Secondary | ICD-10-CM

## 2013-05-01 DIAGNOSIS — E1165 Type 2 diabetes mellitus with hyperglycemia: Secondary | ICD-10-CM | POA: Diagnosis not present

## 2013-05-01 DIAGNOSIS — H43819 Vitreous degeneration, unspecified eye: Secondary | ICD-10-CM

## 2013-05-01 DIAGNOSIS — H35039 Hypertensive retinopathy, unspecified eye: Secondary | ICD-10-CM

## 2013-05-02 ENCOUNTER — Ambulatory Visit (INDEPENDENT_AMBULATORY_CARE_PROVIDER_SITE_OTHER): Payer: Medicare Other | Admitting: Family Medicine

## 2013-05-02 DIAGNOSIS — I4892 Unspecified atrial flutter: Secondary | ICD-10-CM

## 2013-05-02 DIAGNOSIS — Z7901 Long term (current) use of anticoagulants: Secondary | ICD-10-CM

## 2013-05-02 DIAGNOSIS — I4891 Unspecified atrial fibrillation: Secondary | ICD-10-CM | POA: Diagnosis not present

## 2013-05-02 DIAGNOSIS — Z5181 Encounter for therapeutic drug level monitoring: Secondary | ICD-10-CM

## 2013-05-07 ENCOUNTER — Encounter: Payer: Self-pay | Admitting: Nephrology

## 2013-05-15 DIAGNOSIS — D235 Other benign neoplasm of skin of trunk: Secondary | ICD-10-CM | POA: Diagnosis not present

## 2013-05-15 DIAGNOSIS — Z85828 Personal history of other malignant neoplasm of skin: Secondary | ICD-10-CM | POA: Diagnosis not present

## 2013-05-21 DIAGNOSIS — H4720 Unspecified optic atrophy: Secondary | ICD-10-CM | POA: Diagnosis not present

## 2013-05-22 ENCOUNTER — Encounter: Payer: Self-pay | Admitting: Family Medicine

## 2013-05-22 ENCOUNTER — Ambulatory Visit (INDEPENDENT_AMBULATORY_CARE_PROVIDER_SITE_OTHER): Payer: Medicare Other | Admitting: Family Medicine

## 2013-05-22 VITALS — BP 160/64 | HR 60 | Temp 98.0°F | Wt 195.5 lb

## 2013-05-22 DIAGNOSIS — Z0289 Encounter for other administrative examinations: Secondary | ICD-10-CM

## 2013-05-22 DIAGNOSIS — Z024 Encounter for examination for driving license: Secondary | ICD-10-CM

## 2013-05-22 NOTE — Progress Notes (Signed)
Here for DMV forms. See plan and scanned forms.  Discussed his current situation.    Meds, vitals, and allergies reviewed.   ROS: See HPI.  Otherwise, noncontributory.  nad ncat Mmm rrr ctab abd soft, not ttp, normal BS Ext w/o edema.

## 2013-05-22 NOTE — Assessment & Plan Note (Signed)
He had vision exam, see scanned notes.  H/o treated AF/A flutter.  No sx.  CAD s/p CABG w/o CP, SOB.  Doing well.   DM2 controlled, no hypoglycemia.  Doing well.  Sugar controlled, intentional weight loss recently.   H/o OSA.  Compliant. Doing well. Not drowsy.  His machine doesn't provide compliance reports as it is an old model. He reports compliance. He is doing well.  No contraindication to driving.

## 2013-05-22 NOTE — Patient Instructions (Addendum)
Call the cardiology clinic about getting a routine appointment.  Take care. Recheck later in the summer, A1c ahead of the visit.   Glad to see you.

## 2013-05-30 ENCOUNTER — Ambulatory Visit (INDEPENDENT_AMBULATORY_CARE_PROVIDER_SITE_OTHER): Payer: Medicare Other | Admitting: Family Medicine

## 2013-05-30 DIAGNOSIS — I4892 Unspecified atrial flutter: Secondary | ICD-10-CM

## 2013-05-30 DIAGNOSIS — Z5181 Encounter for therapeutic drug level monitoring: Secondary | ICD-10-CM

## 2013-05-30 DIAGNOSIS — Z7901 Long term (current) use of anticoagulants: Secondary | ICD-10-CM | POA: Diagnosis not present

## 2013-05-30 DIAGNOSIS — I4891 Unspecified atrial fibrillation: Secondary | ICD-10-CM | POA: Diagnosis not present

## 2013-05-30 LAB — POCT INR: INR: 2.6

## 2013-06-21 ENCOUNTER — Other Ambulatory Visit: Payer: Self-pay

## 2013-06-21 ENCOUNTER — Other Ambulatory Visit: Payer: Self-pay | Admitting: *Deleted

## 2013-06-21 MED ORDER — TRAMADOL HCL 50 MG PO TABS: 50.0000 mg | ORAL_TABLET | Freq: Two times a day (BID) | ORAL | Status: DC | PRN

## 2013-06-21 NOTE — Telephone Encounter (Signed)
Medication phoned to pharmacy. Rx faxed to [facility].

## 2013-06-21 NOTE — Telephone Encounter (Signed)
[facility] said [facility] will not [facility]ill tram[facility]ol as auto refill and pt has to have a [facility] [place] [facility]. [facility] and pt is almost out of med and request # [phone] sent to [facility].Please advise. Pt request cb when refills done.[name]

## 2013-06-21 NOTE — Telephone Encounter (Signed)
Please call in.  Rx printed to fax in.  Thanks.

## 2013-06-27 ENCOUNTER — Ambulatory Visit (INDEPENDENT_AMBULATORY_CARE_PROVIDER_SITE_OTHER): Payer: Medicare Other | Admitting: Family Medicine

## 2013-06-27 DIAGNOSIS — Z5181 Encounter for therapeutic drug level monitoring: Secondary | ICD-10-CM

## 2013-06-27 DIAGNOSIS — Z7901 Long term (current) use of anticoagulants: Secondary | ICD-10-CM

## 2013-06-27 DIAGNOSIS — I4891 Unspecified atrial fibrillation: Secondary | ICD-10-CM

## 2013-06-27 DIAGNOSIS — I4892 Unspecified atrial flutter: Secondary | ICD-10-CM

## 2013-07-07 ENCOUNTER — Other Ambulatory Visit: Payer: Self-pay | Admitting: Cardiology

## 2013-07-25 ENCOUNTER — Encounter: Payer: Self-pay | Admitting: Family Medicine

## 2013-07-25 ENCOUNTER — Ambulatory Visit (INDEPENDENT_AMBULATORY_CARE_PROVIDER_SITE_OTHER)
Admission: RE | Admit: 2013-07-25 | Discharge: 2013-07-25 | Disposition: A | Discharge status: Home / Self Care | Payer: Medicare Other | Source: Ambulatory Visit | Attending: Family Medicine | Admitting: Family Medicine

## 2013-07-25 ENCOUNTER — Ambulatory Visit (INDEPENDENT_AMBULATORY_CARE_PROVIDER_SITE_OTHER): Payer: Medicare Other | Admitting: Family Medicine

## 2013-07-25 VITALS — BP 140/62 | HR 62 | Temp 98.5°F | Ht 69.0 in | Wt 195.5 lb

## 2013-07-25 DIAGNOSIS — I4891 Unspecified atrial fibrillation: Secondary | ICD-10-CM | POA: Diagnosis not present

## 2013-07-25 DIAGNOSIS — I4892 Unspecified atrial flutter: Secondary | ICD-10-CM | POA: Diagnosis not present

## 2013-07-25 DIAGNOSIS — M25561 Pain in right knee: Secondary | ICD-10-CM

## 2013-07-25 DIAGNOSIS — M25569 Pain in unspecified knee: Secondary | ICD-10-CM

## 2013-07-25 DIAGNOSIS — Z5181 Encounter for therapeutic drug level monitoring: Secondary | ICD-10-CM | POA: Diagnosis not present

## 2013-07-25 DIAGNOSIS — Z7901 Long term (current) use of anticoagulants: Secondary | ICD-10-CM | POA: Diagnosis not present

## 2013-07-25 DIAGNOSIS — M1710 Unilateral primary osteoarthritis, unspecified knee: Secondary | ICD-10-CM | POA: Diagnosis not present

## 2013-07-25 DIAGNOSIS — M25469 Effusion, unspecified knee: Secondary | ICD-10-CM | POA: Diagnosis not present

## 2013-07-25 NOTE — Patient Instructions (Addendum)
Increase tramadol or tylenol to 3 times a day for knee pain if needed.  Elevate leg when able, ice twice daily for swelling. We will call you with X-ray results. Start wearing right knee brace for support when on your feet.

## 2013-07-25 NOTE — Assessment & Plan Note (Addendum)
Most likely OA in B knees with flare.  Will eval with X-ray. NSAIDs contraindicated given on coumadin and has GI bleed history.  Use tylenol or tramadol for pain... Can increase to 3 times a day as needed.  Likely will need steroid injection/ortho referral.

## 2013-07-25 NOTE — Progress Notes (Signed)
  Subjective:    Patient ID: [name], male    DOB: 1932-07-08, 79 y.o.   MRN: [id number]  HPI   79 year old male presents with  chronic bilateral knee pain ongoing for years, OA with acute flare of pain in right knee worse in last week. Noted swelling, pain 6-8/10 on pain scale.  No redness and no warmth. Pain increases with walking, bending it. Cannot go up stairs. Using old crutches.  Feels like joint may be slipping when he twists it. No clicking and pooping, feels some grinding.   Has been using tramadol twice daily for pain in legs/ankles in last month. Has been active lately. Using acetaminophen twice daily   He did fall in 11/2012, landed on right knee with abrasion.  He has a history of longtime running.  No knee surgery in past.     Review of Systems  Constitutional: Negative for fever and fatigue.  HENT: Negative for ear pain.   Eyes: Negative for pain.  Respiratory: Negative for shortness of breath.   Cardiovascular: Negative for chest pain.       Objective:   Physical Exam  Constitutional: Vital signs are normal. He appears well-developed and well-nourished.  HENT:  Head: Normocephalic.  Right Ear: Hearing normal.  Left Ear: Hearing normal.  Nose: Nose normal.  Mouth/Throat: Oropharynx is clear and moist and mucous membranes are normal.  Neck: Trachea normal. Carotid bruit is not present. No mass and no thyromegaly present.  Cardiovascular: Normal rate, regular rhythm and normal pulses.  Exam reveals no gallop, no distant heart sounds and no friction rub.   No murmur heard. No peripheral edema  Pulmonary/Chest: Effort normal and breath sounds normal. No respiratory distress.  Musculoskeletal:       Right knee: He exhibits decreased range of motion, swelling, effusion, deformity and bony tenderness. He exhibits no erythema, no LCL laxity, normal patellar mobility, normal meniscus and no MCL laxity. Tenderness found. Medial joint line and lateral joint  line tenderness noted. No MCL, no LCL and no patellar tendon tenderness noted.       Left knee: He exhibits deformity. He exhibits normal range of motion. No tenderness found.  Skin: Skin is warm, dry and intact. No rash noted.  Psychiatric: He has a normal mood and affect. His speech is normal and behavior is normal. Thought content normal.          Assessment & Plan:

## 2013-07-26 ENCOUNTER — Telehealth: Payer: Self-pay | Admitting: Family Medicine

## 2013-07-26 DIAGNOSIS — M25561 Pain in right knee: Secondary | ICD-10-CM

## 2013-07-26 NOTE — Telephone Encounter (Signed)
Message copied by Jinny Sanders on Fri Jul 26, 2013  1:56 PM ------      Message from: Carter Kitten      Created: Fri Jul 26, 2013  8:48 AM       Mr. Greenstone notified as instructed by telephone.  He would like to go ahead and be referred out to an Orthopedist due to the amount of pain he is in.      Will forward to Dr. Diona Browner to enter referral. ------

## 2013-07-26 NOTE — Telephone Encounter (Signed)
Pt left v/m; had spoken with Butch Penny and pt said he was told if knee is not better in 1-2 weeks could schedule appt to see sports doctor. Pt said does not seem practical to wait if a shot will help his knee pain pt wants shot in his knee now. Please advise.pt wants cb today.

## 2013-07-26 NOTE — Telephone Encounter (Signed)
Called and spoke with [name].  He states he would like to go ahead with the knee injection as soon as possible.  Appointment scheduled with Dr. Lorelei Pont for 07/29/2013 @ 10:00am.

## 2013-07-29 ENCOUNTER — Encounter: Payer: Self-pay | Admitting: Family Medicine

## 2013-07-29 ENCOUNTER — Ambulatory Visit (INDEPENDENT_AMBULATORY_CARE_PROVIDER_SITE_OTHER): Payer: Medicare Other | Admitting: Family Medicine

## 2013-07-29 ENCOUNTER — Ambulatory Visit: Payer: Medicare Other | Admitting: Family Medicine

## 2013-07-29 VITALS — BP 140/70 | HR 57 | Temp 98.1°F | Ht 69.0 in | Wt 194.8 lb

## 2013-07-29 DIAGNOSIS — M25069 Hemarthrosis, unspecified knee: Secondary | ICD-10-CM | POA: Diagnosis not present

## 2013-07-29 DIAGNOSIS — M25061 Hemarthrosis, right knee: Secondary | ICD-10-CM

## 2013-07-29 DIAGNOSIS — M25569 Pain in unspecified knee: Secondary | ICD-10-CM | POA: Diagnosis not present

## 2013-07-29 DIAGNOSIS — M25561 Pain in right knee: Secondary | ICD-10-CM

## 2013-07-29 NOTE — Progress Notes (Signed)
Therapist, music at [facility] [place] [address] Phone: [phone] Fax: [phone]  Date:  07/29/2013   Name:  [name]   DOB:  January 09, 1932   MRN:  [id number] Gender: male Age: 79 y.o.  Primary Physician:  [name], MD  Evaluating MD: Owens Loffler, MD   Chief Complaint: Knee Pain   History of Present Illness:  [name] is a 79 y.o. pleasant patient who presents with the following:  Dr. Diona Browner asked me to evaluate the patient for right sided knee pain. He reports having had arthritis for some time in his knees. I personally reviewed what appear to be non-weightbearing views of the R knee and there appears to be mild tricompartmental OA with more prominent joint space narrowing medially and at the PF joint. No fractures. Multiple radioopaque bodies, presumably from graft harvest for CABG. There is also evidence of significant calcification in his vasculature.   He developed some relatively acute knee pain about 10 days ago. Does not recall any specific trauma. He does recall twisting it some. Great difficulty walking and getting around to perform his daily activities.   He is on Coumadin for AF.  R knee large effusion.   07/25/2013 OV: Dr. Diona Browner 79 year old male presents with  chronic bilateral knee pain ongoing for years, OA with acute flare of pain in right knee worse in last week. Noted swelling, pain 6-8/10 on pain scale.  No redness and no warmth. Pain increases with walking, bending it. Cannot go up stairs. Using old crutches.  Feels like joint may be slipping when he twists it. No clicking and pooping, feels some grinding.    Has been using tramadol twice daily for pain in legs/ankles in last month. Has been active lately. Using acetaminophen twice daily   He did fall in 11/2012, landed on right knee with abrasion.  He has a history of longtime running.  No knee surgery in past.   Patient Active Problem List   Diagnosis Date Noted  . Right knee pain 07/25/2013  . Encounter for examination for driving license [id number]  . OA (osteoarthritis) 03/14/2013  . RBBB (right bundle branch block)   . Shoulder pain   . Neuropathy   . Carotid artery disease   . CAD (coronary artery disease)   . Hx of CABG   . GI bleeding   . Ejection fraction   . Mitral regurgitation   . Hypogonadism male 03/05/2012  . Medicare annual wellness visit, subsequent 03/05/2012  . ED (erectile dysfunction) 03/01/2012  . Gout 09/05/2011  . Epistaxis 09/05/2011  . CKD (chronic kidney disease) stage 3, GFR 30-59 ml/min 03/24/2011  . Colon cancer screening 02/24/2011  . Skin cancer 02/24/2011  . Atrial flutter 08/24/2010  . ATRIAL FIBRILLATION 08/09/2010  . MUSCLE CRAMPS, FOOT 10/28/2009  . DIABETIC PERIPHERAL NEUROPATHY 05/12/2009  . GASTROESOPHAGEAL REFLUX DISEASE 05/12/2009  . SLEEP APNEA 05/12/2009  . EDEMA 05/12/2009  . MACULAR DEGENERATION, HX OF 05/12/2009  . CAROTID ENDARTERECTOMY, HX OF 05/12/2009  . POLYPECTOMY, HX OF 05/12/2009  . CATARACT EXTRACTION, HX OF 05/12/2009  . Other acquired absence of organ 05/12/2009  . ANEMIA, SECONDARY TO ACUTE BLOOD LOSS 10/01/2008  . DIVERTICULOSIS, COLON, WITH HEMORRHAGE 10/01/2008  . COLONIC POLYPS, BENIGN, HX OF 10/01/2008  . ANEMIA 09/13/2007  . ALLERGY, ENVIRONMENTAL 03/03/2007  . DIABETES MELLITUS, TYPE II 02/05/2007  . HYPERLIPIDEMIA 02/05/2007  . HYPERTENSION 02/05/2007    Past Medical History  Diagnosis  Date  . GERD (gastroesophageal reflux disease)   . Anemia     Secondary to acute blood loss  . Allergy     Environmental  . Hypertension   . Hyperlipidemia   . Diabetes mellitus     Type II  . Diabetic peripheral neuropathy   . RBBB (right bundle branch block)   . Macular degeneration   . Sleep apnea   . Systolic murmur   . Carotid artery disease     Doppler 09/18/2009 - 49% bilateral stenoses  . Special screening for malignant neoplasm of prostate    . Personal history of colonic polyps   . Diverticulosis of colon with hemorrhage   . Special screening for malignant neoplasms of other sites   . Benign neoplasm of colon   . Erectile dysfunction     Mild  . Diverticulosis of colon (without mention of hemorrhage)   . Edema   . Myalgia   . PNA (pneumonia) 9/11    NSTEMI at [facility] with repeat cath, rec medical mgmt   . Atrial flutter 07/2010    September, 2011   Hospital with PNA and cath done.Marland KitchenMarland KitchenCoumadin.  Atrial flutter ablation planned, but  pt. then had atrial fibrillation,/outpatient conversion 09/08/10..NSR..plan to follow..Dr. Caryl Comes  . GI bleeding     Off aspirin because of prior GI bleed, Severe, 2009, multiple units of blood transfused, patient taken off aspirin  . Chronic kidney disease     ckd #3  . Gout   . Skin cancer     R lower leg, per derm 2012  . Atrial fibrillation     Consideration was given for atrial flutter ablation, but patient developed atrial fibrillation. Cardioversion was done. Dr. Caryl Comes decided to watch him clinically. November, 2011  . Neuropathy     Diabetic  . Carotid artery disease     49% bilateral, Doppler, November, 2010  . CAD (coronary artery disease)     Catheterization, September, 2011,  grafts patent from redo CABG,, medical therapy of coronary disease, consideration to proceeding with atrial flutter ablation  . Hx of CABG     CABG in 1995  //   redo CABG 2006  . Ejection fraction     EF 60%, echo, 2009  //   EF 65%, echo, September, 2011  . Mitral regurgitation     Mild, echo, September, 2011  . Shoulder pain     Decreasing Crestor did not affect the pain    Past Surgical History  Procedure Laterality Date  . Cardiac catheterization  2006    Nuclear..slight lateral ischemia..medical therapy  . Cardiac catheterization  08/04/2010    grafts patent from redo CABG...medical Rx and ablate Atrial flutter (LV not injected)   . Doppler echocardiography  08/2008    EF 60%  . Doppler  echocardiography  08/02/2010    65-70%  . Doppler echocardiography  07/2010    MR mild  . Coronary artery bypass graft  1995     Redo  CABG 2006  . Gi bleeding  2009    Severe. multiple units of blood transfused..patient off aspirin  . Polypectomy    . Cataract extraction    . Vasectomy    . Tonsillectomy    . Carotid endarterectomy    . Coronary artery bypass graft    . Cholecystectomy      History   Social History  . Marital Status: Widowed    Spouse Name: N/A    Number of Children: 2  .  Years of Education: N/A   Occupational History  . 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 Smoking status: Former Smoker    Quit date: 11/14/1958  . Smokeless tobacco: Never Used  . Alcohol Use: No  . Drug Use: No  . Sexual Activity: Not on file   Other Topics Concern  . Not on file   Social History Narrative   From [place].  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  Widower 12/2005    Family History  Problem Relation Age of Onset  . Kidney disease Mother     Kidney failure  . Stroke Mother   . Diabetes Mother   . Heart disease Father     MI  . Arthritis Sister   . Cancer Sister     Throat  . Heart disease Sister     MI  . Diabetes Sister   . Prostate cancer Neg Hx   . Colon cancer Neg Hx     No Known Allergies  Medication list has been reviewed and updated.  Outpatient Prescriptions Prior to Visit  Medication Sig Dispense Refill  . acetaminophen (TYLENOL) 500 MG tablet Take 1,000 mg by mouth daily. For pain      . amLODipine (NORVASC) 10 MG tablet Take 1 tablet (10 mg total) by mouth every evening.  90 tablet  2  . Cholecalciferol (VITAMIN D) 1000 UNITS capsule Take 2,000 Units by mouth daily.       . colchicine 0.6 MG tablet Take 0.6 mg by mouth 3 (three) times a week. For gout flare ups      . fish oil-omega-3 fatty acids 1000 MG capsule Take 2 g by mouth 2 (two) times daily.        . furosemide  (LASIX) 80 MG tablet TAKE 1 TABLET DAILY  90 tablet  0  . insulin glargine (LANTUS) 100 UNIT/ML injection Inject 0.3 mLs (30 Units total) into the skin daily. As instructed by MD, dispense 4 vials  40 mL  3  . Insulin Syringe-Needle U-100 (INSULIN SYRINGE .3CC/31GX5/16") 31G X 5/16" 0.3 ML MISC Use daily as instructed.       . magnesium oxide (MAG-OX) 400 MG tablet Take 400 mg by mouth daily.       . metoprolol succinate (TOPROL-XL) 50 MG 24 hr tablet Take 1 tablet (50 mg total) by mouth daily.  90 tablet  2  . Multiple Vitamin (MULTIVITAMIN WITH MINERALS) TABS Take 1 tablet by mouth daily.      . Potassium Gluconate (K-99) 595 MG CAPS Take 595 mg by mouth every evening.       . rosuvastatin (CRESTOR) 20 MG tablet Take 1 tablet (20 mg total) by mouth daily.  90 tablet  1  . traMADol (ULTRAM) 50 MG tablet Take 50 mg by mouth every 8 (eight) hours as needed for pain.      [name] VIAGRA 100 MG tablet TAKE 1 TABLET 1 HOUR PRIOR TO ACTIVITY  18 tablet  5  . warfarin (COUMADIN) 7.5 MG tablet Take as directed by coumadin clinic  90 tablet  1   No facility-administered medications prior to visit.    Review of Systems:   GEN: No fevers, chills. Nontoxic. Primarily MSK c/o today. MSK: Detailed in the HPI GI: tolerating PO intake without difficulty Neuro: No numbness, parasthesias, or tingling associated. Otherwise, the pertinent positives and negatives are listed above and  in the HPI, otherwise a full review of systems has been reviewed and is negative unless noted positive.    Physical Examination: BP 140/70  Pulse 57  Temp(Src) 98.1 F (36.7 C) (Oral)  Ht 5\' 9"  (1.753 m)  Wt 194 lb 12 oz (88.338 kg)  BMI 28.75 kg/m2  Ideal Body Weight: Weight in (lb) to have BMI = 25: 168.9   GEN: WDWN, NAD, Non-toxic, Alert & Oriented x 3 HEENT: Atraumatic, Normocephalic.  Ears and Nose: No external deformity. EXTR: No clubbing/cyanosis/tr - 1+edema PSYCH: Normally interactive. Conversant. Not depressed  or anxious appearing.  Calm demeanor.    R knee: lacks 4 degrees of ext. Flexion to 85 deg. TTP patella, tibial plateau, medial and lateral joint lines. MCL and LCL appear intact. A great deal of guarding limits exam, and he has a large effusion. Lachman and drawer exams are equivocal due to pain, guarding, and lack of motion. All meniscal testing equivocal.  Dg Knee Complete 4 Views Right  07/25/2013   CLINICAL DATA:  Chronic right knee pain worsening past few days  EXAM: RIGHT KNEE - COMPLETE 4+ VIEW  COMPARISON:  None.  FINDINGS: Five views of the right knee submitted. No acute fracture or subluxation. There is narrowing of medial joint compartment. Mild spurring of femoral condyles and medial tibial plateau. Surgical clips are noted within soft tissue posteriorly. Small joint effusion. Significant spurring of patella. Dystrophic calcifications are noted quadriceps tendon. Atherosclerotic calcifications of femoral and popliteal artery. Significant narrowing of patellofemoral joint space.  IMPRESSION: No acute fracture or subluxation. Degenerative changes as described above. Small joint effusion.   Electronically Signed   By: Lahoma Crocker   On: 07/25/2013 14:51    - see my interpretation in HPI  Assessment and Plan:  Right knee pain - Plan: MR Knee Right Wo Contrast  Hemarthrosis involving knee joint, right - Plan: MR Knee Right Wo Contrast  100 cc hemarthrosis is very uncommon even in patients on Coumadin. Etiology unclear, cannot exclude occult intraarticular fracture, stress fracture not seen on X-ray, or ruptured ACL. Obtain an MRI of the right knee to evaluate for intraarticular fracture, tibial plateau fracture, patellar fracture, complete ACL rupture to help explain large volume hemarthrosis. Needed to determine if complete immobilization is needed for healing and delineate plan of care.   Knee Aspiration, RIGHT Patient verbally consented; risks, benefits, and alternatives explained including  possible infection. Patient prepped with Chloraprep. Ethyl chloride for anesthesia. 10 cc of 1% Lidocaine used in wheal then injected Subcutaneous fashion with 22 gauge needle on lateral approach. Under sterilne conditions, 18 gauge needle used via lateral approach to aspirate 100 cc of frank blood. No medication instilled into joint. Tolerated well, decreased pain, no complications.   Orders Today:  Orders Placed This Encounter  Procedures  . MR Knee Right Wo Contrast    Wt 194/no needs//no claus//mcr and tricare for life/plm and  Rosaria Ferries with epic order    Standing Status: Future     Number of Occurrences:      Standing Expiration Date: 09/28/2014    Order Specific Question:  Does the patient have a pacemaker, internal devices, implants, aneury    Answer:  No    Order Specific Question:  Preferred imaging location?    Answer:  [id number]    Order Specific Question:  Reason for exam:    Answer:  R severe knee pain, normal films with OA, 100 mL hemarthrosis, severe pain on exam  Updated Medication List: (Includes new medications, updates to list, dose adjustments) No orders of the defined types were placed in this encounter.    Medications Discontinued: There are no discontinued medications.    Signed, Maud Deed. Elianny Buxbaum, MD 07/29/2013 10:44 AM

## 2013-07-29 NOTE — Patient Instructions (Addendum)
REFERRAL: GO THE THE FRONT ROOM AT THE ENTRANCE OF OUR CLINIC, NEAR CHECK IN. ASK FOR [name]. SHE WILL HELP YOU SET UP YOUR REFERRAL. DATE: TIME:  

## 2013-07-31 ENCOUNTER — Ambulatory Visit
Admission: RE | Admit: 2013-07-31 | Discharge: 2013-07-31 | Disposition: A | Discharge status: Home / Self Care | Payer: Medicare Other | Source: Ambulatory Visit | Attending: Family Medicine | Admitting: Family Medicine

## 2013-07-31 DIAGNOSIS — M1710 Unilateral primary osteoarthritis, unspecified knee: Secondary | ICD-10-CM | POA: Diagnosis not present

## 2013-07-31 DIAGNOSIS — M25061 Hemarthrosis, right knee: Secondary | ICD-10-CM

## 2013-07-31 DIAGNOSIS — M25561 Pain in right knee: Secondary | ICD-10-CM

## 2013-08-01 ENCOUNTER — Ambulatory Visit (INDEPENDENT_AMBULATORY_CARE_PROVIDER_SITE_OTHER): Payer: Medicare Other | Admitting: Family Medicine

## 2013-08-01 ENCOUNTER — Encounter: Payer: Self-pay | Admitting: Family Medicine

## 2013-08-01 VITALS — BP 134/60 | HR 65 | Temp 98.4°F | Ht 69.0 in | Wt 190.8 lb

## 2013-08-01 DIAGNOSIS — M1711 Unilateral primary osteoarthritis, right knee: Secondary | ICD-10-CM

## 2013-08-01 DIAGNOSIS — M25069 Hemarthrosis, unspecified knee: Secondary | ICD-10-CM | POA: Diagnosis not present

## 2013-08-01 DIAGNOSIS — M25561 Pain in right knee: Secondary | ICD-10-CM

## 2013-08-01 DIAGNOSIS — M1710 Unilateral primary osteoarthritis, unspecified knee: Secondary | ICD-10-CM

## 2013-08-01 DIAGNOSIS — M25061 Hemarthrosis, right knee: Secondary | ICD-10-CM

## 2013-08-01 DIAGNOSIS — M25569 Pain in unspecified knee: Secondary | ICD-10-CM

## 2013-08-01 NOTE — Progress Notes (Signed)
Therapist, music at [facility] [place] [address] Phone: [phone] Fax: [phone]  Date:  08/01/2013   Name:  [name]   DOB:  [birth year]   MRN:  [id number] Gender: male Age: 79 y.o.  Primary Physician:  [name], MD  Evaluating MD: Owens Loffler, MD  Chief Complaint: MRI results   History of Present Illness:  [name] is a 79 y.o. very pleasant male patient who presents with the following:  Pleasant gentleman on Coumadin who I saw several days ago with a large effusion and right-sided knee pain. At that time I aspirated his right knee in.100 cc of frank blood. X-rays were grossly unremarkable. At that time I was concerned that there could be an undiagnosed fracture or other internal derangement that could be causing the bleeding. I obtained an MRI of the right knee.  Mr Knee Right Wo Contrast  07/31/2013   *RADIOLOGY REPORT*  Clinical Data:  Right knee pain and swelling.  Suprapatellar pain. Hemarthrosis.  Trauma 3 weeks ago.  MRI OF THE RIGHT KNEE WITHOUT CONTRAST  Technique:  Multiplanar, multisequence MR imaging of the right knee was performed.  No intravenous contrast was administered.  Comparison:  Radiographs 07/25/2013.  FINDINGS: Prior radiographs demonstrated loose bodies in the suprapatellar pouch. MENISCI Medial:  Severe medial meniscal degeneration with fraying of the free edge and undersurface of the body and posterior horn.  No discrete tear is present. Lateral:  Degenerative fraying without tear.  LIGAMENTS Cruciates:  Intact. Collaterals:  Intact.  CARTILAGE Patellofemoral:  Severe patellofemoral osteoarthritis.  The patella and trochlear sulcus are completely denuded of articular cartilage and there are subchondral cysts. Medial:  Intact. Lateral:  Intact.  Joint:  Large effusion with debris in the suprapatellar pouch. Some this probably represents hemorrhage or retracted clot.  On the sagittal imaging, there appears to be a 15  mm loose body in the suprapatellar pouch.  This is only partially visualized and can also be seen on the coronal T1-weighted imaging (image number 15 series 6). Popliteal Fossa:  Small uncomplicated Baker's cyst. Extensor Mechanism:  Normal. Bones: No significant extra-articular osseous findings.  Small marginal osteophytes in the medial and lateral compartment.  IMPRESSION: 1.  Severe patellofemoral osteoarthritis.  Patellofemoral joint is completely denuded articular cartilage. 2.  Large effusion with debris in the suprapatellar pouch.  This may represent hemorrhage/retracted clot. 3.  Partially visualized 15 mm loose body in the suprapatellar pouch 4.  Severe medial meniscal degeneration without discrete tear.   Original Report Authenticated By: Dereck Ligas, M.D.   Dg Knee Complete 4 Views Right  07/25/2013   CLINICAL DATA:  Chronic right knee pain worsening past few days  EXAM: RIGHT KNEE - COMPLETE 4+ VIEW  COMPARISON:  None.  FINDINGS: Five views of the right knee submitted. No acute fracture or subluxation. There is narrowing of medial joint compartment. Mild spurring of femoral condyles and medial tibial plateau. Surgical clips are noted within soft tissue posteriorly. Small joint effusion. Significant spurring of patella. Dystrophic calcifications are noted quadriceps tendon. Atherosclerotic calcifications of femoral and popliteal artery. Significant narrowing of patellofemoral joint space.  IMPRESSION: No acute fracture or subluxation. Degenerative changes as described above. Small joint effusion.   Electronically Signed   By: Lahoma Crocker   On: 07/25/2013 14:51    Past Medical History, Surgical History, Social History, Family History, Problem List, Medications, and Allergies have been reviewed and updated if relevant.  Current Outpatient Prescriptions  on File Prior to Visit  Medication Sig Dispense Refill  . acetaminophen (TYLENOL) 500 MG tablet Take 1,000 mg by mouth daily. For pain      .  amLODipine (NORVASC) 10 MG tablet Take 1 tablet (10 mg total) by mouth every evening.  90 tablet  2  . Cholecalciferol (VITAMIN D) 1000 UNITS capsule Take 2,000 Units by mouth daily.       . colchicine 0.6 MG tablet Take 0.6 mg by mouth 3 (three) times a week. For gout flare ups      . fish oil-omega-3 fatty acids 1000 MG capsule Take 2 g by mouth 2 (two) times daily.        . furosemide (LASIX) 80 MG tablet TAKE 1 TABLET DAILY  90 tablet  0  . insulin glargine (LANTUS) 100 UNIT/ML injection Inject 0.3 mLs (30 Units total) into the skin daily. As instructed by MD, dispense 4 vials  40 mL  3  . Insulin Syringe-Needle U-100 (INSULIN SYRINGE .3CC/31GX5/16") 31G X 5/16" 0.3 ML MISC Use daily as instructed.       . magnesium oxide (MAG-OX) 400 MG tablet Take 400 mg by mouth daily.       . metoprolol succinate (TOPROL-XL) 50 MG 24 hr tablet Take 1 tablet (50 mg total) by mouth daily.  90 tablet  2  . Multiple Vitamin (MULTIVITAMIN WITH MINERALS) TABS Take 1 tablet by mouth daily.      . Potassium Gluconate (K-99) 595 MG CAPS Take 595 mg by mouth every evening.       . rosuvastatin (CRESTOR) 20 MG tablet Take 1 tablet (20 mg total) by mouth daily.  90 tablet  1  . traMADol (ULTRAM) 50 MG tablet Take 50 mg by mouth every 8 (eight) hours as needed for pain.      [name] VIAGRA 100 MG tablet TAKE 1 TABLET 1 HOUR PRIOR TO ACTIVITY  18 tablet  5  . warfarin (COUMADIN) 7.5 MG tablet Take as directed by coumadin clinic  90 tablet  1   No current facility-administered medications on file prior to visit.    Review of Systems:  GEN: No fevers, chills. Nontoxic. Primarily MSK c/o today. MSK: Detailed in the HPI GI: tolerating PO intake without difficulty Neuro: No numbness, parasthesias, or tingling associated. Otherwise the pertinent positives of the ROS are noted above.    Physical Examination: BP 134/60  Pulse 65  Temp(Src) 98.4 F (36.9 C) (Oral)  Ht 5\' 9"  (1.753 m)  Wt 190 lb 12 oz (86.524 kg)  BMI  28.16 kg/m2   GEN: WDWN, NAD, Non-toxic, Alert & Oriented x 3 HEENT: Atraumatic, Normocephalic.  Ears and Nose: No external deformity. EXTR: No clubbing/cyanosis/edema NEURO: antalgic  PSYCH: Normally interactive. Conversant. Not depressed or anxious appearing.  Calm demeanor.  Right knee: Nontender along the patella. Medial joint line pain. Large ballotable effusion. Extension and lacks 5. Flexion to 95. All other special testing is equivocal. Anterior cruciate ligament, PCL, MCL, LCL are all intact.    Assessment and Plan: Osteoarthritis of right knee  Hemarthrosis involving knee joint, right  Right knee pain   We reviewed his MRI in detail, and reviewed treatment options. At this point, he would most likely a avoid surgery and see if he can get is a calm down from a conservative manner. He is still on Coumadin. He stopped this for 1-2 days, and I instructed him to resume it given his history of atrial fibrillation.  Knee Aspiration  and Injection, RIGHT Patient verbally consented; risks, benefits, and alternatives explained including possible infection. Patient prepped with Chloraprep. Ethyl chloride for anesthesia. 10 cc of 1% Lidocaine used in wheal then injected Subcutaneous fashion with 22 gauge needle on lateral approach. Under sterilne conditions, 18 gauge needle used via lateral approach to aspirate 60 cc of bloody fluid. Then 2 cc of Depo-Medrol 40 mg injected. Tolerated well, decreased pain, no complications.   Asp 60 cc inj 2 cc depomedrol only  Signed, Tracy Gerken T. Teegan Brandis, MD 08/01/2013 12:42 PM

## 2013-08-22 ENCOUNTER — Ambulatory Visit (INDEPENDENT_AMBULATORY_CARE_PROVIDER_SITE_OTHER): Payer: Medicare Other | Admitting: Family Medicine

## 2013-08-22 DIAGNOSIS — I4892 Unspecified atrial flutter: Secondary | ICD-10-CM

## 2013-08-22 DIAGNOSIS — Z5181 Encounter for therapeutic drug level monitoring: Secondary | ICD-10-CM

## 2013-08-22 DIAGNOSIS — I4891 Unspecified atrial fibrillation: Secondary | ICD-10-CM

## 2013-08-22 DIAGNOSIS — Z7901 Long term (current) use of anticoagulants: Secondary | ICD-10-CM | POA: Diagnosis not present

## 2013-08-25 ENCOUNTER — Other Ambulatory Visit: Payer: Self-pay | Admitting: Family Medicine

## 2013-08-25 MED ORDER — TRAMADOL HCL 50 MG PO TABS: 50.0000 mg | ORAL_TABLET | Freq: Three times a day (TID) | ORAL | Status: DC | PRN

## 2013-08-26 ENCOUNTER — Other Ambulatory Visit: Payer: Self-pay | Admitting: General Practice

## 2013-08-26 MED ORDER — WARFARIN SODIUM 7.5 MG PO TABS: ORAL_TABLET | ORAL | Status: DC

## 2013-08-26 NOTE — Telephone Encounter (Signed)
E.script failed

## 2013-08-26 NOTE — Addendum Note (Signed)
Addended by: Despina Hidden on: 08/26/2013 11:28 AM   Modules accepted: Orders

## 2013-08-27 ENCOUNTER — Encounter: Payer: Self-pay | Admitting: Cardiology

## 2013-08-27 ENCOUNTER — Ambulatory Visit (INDEPENDENT_AMBULATORY_CARE_PROVIDER_SITE_OTHER): Payer: Medicare Other | Admitting: Cardiology

## 2013-08-27 VITALS — BP 152/62 | HR 65 | Ht 69.0 in | Wt 192.4 lb

## 2013-08-27 DIAGNOSIS — I2510 Atherosclerotic heart disease of native coronary artery without angina pectoris: Secondary | ICD-10-CM

## 2013-08-27 DIAGNOSIS — I10 Essential (primary) hypertension: Secondary | ICD-10-CM | POA: Diagnosis not present

## 2013-08-27 DIAGNOSIS — I779 Disorder of arteries and arterioles, unspecified: Secondary | ICD-10-CM

## 2013-08-27 DIAGNOSIS — I340 Nonrheumatic mitral (valve) insufficiency: Secondary | ICD-10-CM

## 2013-08-27 DIAGNOSIS — I4891 Unspecified atrial fibrillation: Secondary | ICD-10-CM | POA: Diagnosis not present

## 2013-08-27 DIAGNOSIS — R609 Edema, unspecified: Secondary | ICD-10-CM

## 2013-08-27 DIAGNOSIS — I059 Rheumatic mitral valve disease, unspecified: Secondary | ICD-10-CM | POA: Diagnosis not present

## 2013-08-27 DIAGNOSIS — K922 Gastrointestinal hemorrhage, unspecified: Secondary | ICD-10-CM

## 2013-08-27 DIAGNOSIS — E785 Hyperlipidemia, unspecified: Secondary | ICD-10-CM

## 2013-08-27 NOTE — Assessment & Plan Note (Signed)
There is mild mitral regurgitation by history. He does not need a followup echo for this at this time.

## 2013-08-27 NOTE — Assessment & Plan Note (Signed)
His last Doppler was November, 2013. He will need a followup at one year.

## 2013-08-27 NOTE — Patient Instructions (Signed)
Your physician recommends that you continue on your current medications as directed. Please refer to the Current Medication list given to you today.  Your physician wants you to follow-up in: 6 months. You will receive a reminder letter in the mail two months in advance. If you don't receive a letter, please call our office to schedule the follow-up appointment.  

## 2013-08-27 NOTE — Assessment & Plan Note (Signed)
The patient is concerned because he had a significant GI bleed in the past. However he is stable. No further workup. He will remain on Coumadin.

## 2013-08-27 NOTE — Assessment & Plan Note (Signed)
His volume status has remained stable on his current dose of diuretics. No change in therapy.

## 2013-08-27 NOTE — Assessment & Plan Note (Signed)
The patient has had both atrial fibrillation and atrial flutter in the past. He asked me if he still needs Coumadin. I had a long discussion with him about the rationale and the benefit of the drug. For now he is willing to continue Coumadin.   as part of today's evaluation I spent greater than 25 minutes with is total care. More than half of this has been with direct contact with him during his exam and talking about his problems and reviewing all of his medications and all of the medications. He may consider stopping his fish oil. He may consider stopping magnesium and vitamins that he has started on his own. He will continue all of his other medications.

## 2013-08-27 NOTE — Assessment & Plan Note (Signed)
Coronary disease is stable. His grafts were patent from his redo in 2011. He may in fact have some mild angina at times and now. I considered increasing his beta blocker but his resting heart rate at home runs at 55 on a regular basis. I've chosen not to change his medications. I've chosen not to proceed with exercise testing. If he has increasing symptoms, more aggressive evaluation will be undertaken.

## 2013-08-27 NOTE — Assessment & Plan Note (Addendum)
The patient is on 20 mg of Crestor. No change in therapy. He has been taking fish oil. I told him that I did not feel that this was absolutely necessary.

## 2013-08-27 NOTE — Assessment & Plan Note (Signed)
Blood pressure in the office today is slightly elevated. He takes his pressure at home. On a regular basis his systolic pressure is not elevated. I will not change his medicines today.

## 2013-08-27 NOTE — Progress Notes (Signed)
HPI  Patient is seated followup coronary disease and atrial fibrillation. I saw him last November, 2013. There is a very extensive note from that visit. Currently he is doing relatively well. He has bleeding from his gums when he flosses. He's not had any GI bleeding. He asked me to rereview the issues of his Coumadin therapy and we did this. He mentions that he is having some exertional chest tightness intermittently only when he walks a long distance in his yard. He is not having symptoms at other times with lesser forms of exercise. The symptom is not as severe has symptoms that he's had in the past when he required repeat CABG. He also asked me to rereview the indication for all of his medications and we have done this.  No Known Allergies  Current Outpatient Prescriptions  Medication Sig Dispense Refill  . acetaminophen (TYLENOL) 500 MG tablet Take 1,000 mg by mouth daily. For pain      . amLODipine (NORVASC) 10 MG tablet Take 1 tablet (10 mg total) by mouth every evening.  90 tablet  2  . Cholecalciferol (VITAMIN D) 1000 UNITS capsule Take 2,000 Units by mouth daily.       . colchicine 0.6 MG tablet Take 0.6 mg by mouth 3 (three) times a week. For gout flare ups      . fish oil-omega-3 fatty acids 1000 MG capsule Take 2 g by mouth 2 (two) times daily.        . furosemide (LASIX) 80 MG tablet TAKE 1 TABLET DAILY  90 tablet  0  . insulin glargine (LANTUS) 100 UNIT/ML injection Inject 0.3 mLs (30 Units total) into the skin daily. As instructed by MD, dispense 4 vials  40 mL  3  . Insulin Syringe-Needle U-100 (INSULIN SYRINGE .3CC/31GX5/16") 31G X 5/16" 0.3 ML MISC Use daily as instructed.       . magnesium oxide (MAG-OX) 400 MG tablet Take 400 mg by mouth daily.       . metoprolol succinate (TOPROL-XL) 50 MG 24 hr tablet Take 1 tablet (50 mg total) by mouth daily.  90 tablet  2  . Multiple Vitamin (MULTIVITAMIN WITH MINERALS) TABS Take 1 tablet by mouth daily.      . Potassium Gluconate  (K-99) 595 MG CAPS Take 595 mg by mouth every evening.       . rosuvastatin (CRESTOR) 20 MG tablet Take 1 tablet (20 mg total) by mouth daily.  90 tablet  1  . traMADol (ULTRAM) 50 MG tablet Take 1 tablet (50 mg total) by mouth every 8 (eight) hours as needed for pain.  270 tablet  1  . VIAGRA 100 MG tablet TAKE 1 TABLET 1 HOUR PRIOR TO ACTIVITY  18 tablet  5  . warfarin (COUMADIN) 7.5 MG tablet Take as directed by coumadin clinic  90 tablet  1   No current facility-administered medications for this visit.    History   Social History  . Marital Status: Widowed    Spouse Name: N/A    Number of Children: 2  . Years of Education: N/A   Occupational History  . 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 Smoking status: Former Smoker    Quit date: 11/14/1958  . Smokeless tobacco: Never Used  . Alcohol Use: No  . Drug Use: No  . Sexual Activity: Not on file   Other Topics Concern  . Not on file   Social  History Narrative   From [place].  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  Widower 12/2005    Family History  Problem Relation Age of Onset  . Kidney disease Mother     Kidney failure  . Stroke Mother   . Diabetes Mother   . Heart disease Father     MI  . Arthritis Sister   . Cancer Sister     Throat  . Heart disease Sister     MI  . Diabetes Sister   . Prostate cancer Neg Hx   . Colon cancer Neg Hx     Past Medical History  Diagnosis Date  . GERD (gastroesophageal reflux disease)   . Anemia     Secondary to acute blood loss  . Allergy     Environmental  . Hypertension   . Hyperlipidemia   . Diabetes mellitus     Type II  . Diabetic peripheral neuropathy   . RBBB (right bundle branch block)   . Macular degeneration   . Sleep apnea   . Systolic murmur   . Carotid artery disease     Doppler 09/18/2009 - 49% bilateral stenoses  . Special screening for malignant neoplasm of prostate   .  Personal history of colonic polyps   . Diverticulosis of colon with hemorrhage   . Special screening for malignant neoplasms of other sites   . Benign neoplasm of colon   . Erectile dysfunction     Mild  . Diverticulosis of colon (without mention of hemorrhage)   . Edema   . Myalgia   . PNA (pneumonia) 9/11    NSTEMI at [facility] with repeat cath, rec medical mgmt   . Atrial flutter 07/2010    September, 2011   Hospital with PNA and cath done.Marland KitchenMarland KitchenCoumadin.  Atrial flutter ablation planned, but  pt. then had atrial fibrillation,/outpatient conversion 09/08/10..NSR..plan to follow..Dr. Caryl Comes  . GI bleeding     Off aspirin because of prior GI bleed, Severe, 2009, multiple units of blood transfused, patient taken off aspirin  . Chronic kidney disease     ckd #3  . Gout   . Skin cancer     R lower leg, per derm 2012  . Atrial fibrillation     Consideration was given for atrial flutter ablation, but patient developed atrial fibrillation. Cardioversion was done. Dr. Caryl Comes decided to watch him clinically. November, 2011  . Neuropathy     Diabetic  . Carotid artery disease     49% bilateral, Doppler, November, 2010  . CAD (coronary artery disease)     Catheterization, September, 2011,  grafts patent from redo CABG,, medical therapy of coronary disease, consideration to proceeding with atrial flutter ablation  . Hx of CABG     CABG in 1995  //   redo CABG 2006  . Ejection fraction     EF 60%, echo, 2009  //   EF 65%, echo, September, 2011  . Mitral regurgitation     Mild, echo, September, 2011  . Shoulder pain     Decreasing Crestor did not affect the pain    Past Surgical History  Procedure Laterality Date  . Cardiac catheterization  2006    Nuclear..slight lateral ischemia..medical therapy  . Cardiac catheterization  08/04/2010    grafts patent from redo CABG...medical Rx and ablate Atrial flutter (LV not injected)   . Doppler echocardiography  08/2008    EF 60%  .  Doppler  echocardiography  08/02/2010    65-70%  . Doppler echocardiography  07/2010    MR mild  . Coronary artery bypass graft  1995     Redo  CABG 2006  . Gi bleeding  2009    Severe. multiple units of blood transfused..patient off aspirin  . Polypectomy    . Cataract extraction    . Vasectomy    . Tonsillectomy    . Carotid endarterectomy    . Coronary artery bypass graft    . Cholecystectomy      Patient Active Problem List   Diagnosis Date Noted  . Right knee pain 07/25/2013  . Encounter for examination for driving license [id number]  . OA (osteoarthritis) 03/14/2013  . RBBB (right bundle branch block)   . Shoulder pain   . Neuropathy   . Carotid artery disease   . CAD (coronary artery disease)   . Hx of CABG   . GI bleeding   . Ejection fraction   . Mitral regurgitation   . Hypogonadism male 03/05/2012  . Medicare annual wellness visit, subsequent 03/05/2012  . ED (erectile dysfunction) 03/01/2012  . Gout 09/05/2011  . Epistaxis 09/05/2011  . CKD (chronic kidney disease) stage 3, GFR 30-59 ml/min 03/24/2011  . Colon cancer screening 02/24/2011  . Skin cancer 02/24/2011  . Atrial flutter 08/24/2010  . ATRIAL FIBRILLATION 08/09/2010  . MUSCLE CRAMPS, FOOT 10/28/2009  . DIABETIC PERIPHERAL NEUROPATHY 05/12/2009  . GASTROESOPHAGEAL REFLUX DISEASE 05/12/2009  . SLEEP APNEA 05/12/2009  . EDEMA 05/12/2009  . MACULAR DEGENERATION, HX OF 05/12/2009  . CAROTID ENDARTERECTOMY, HX OF 05/12/2009  . POLYPECTOMY, HX OF 05/12/2009  . CATARACT EXTRACTION, HX OF 05/12/2009  . Other acquired absence of organ 05/12/2009  . ANEMIA, SECONDARY TO ACUTE BLOOD LOSS 10/01/2008  . DIVERTICULOSIS, COLON, WITH HEMORRHAGE 10/01/2008  . COLONIC POLYPS, BENIGN, HX OF 10/01/2008  . ANEMIA 09/13/2007  . ALLERGY, ENVIRONMENTAL 03/03/2007  . DIABETES MELLITUS, TYPE II 02/05/2007  . HYPERLIPIDEMIA 02/05/2007  . HYPERTENSION 02/05/2007    ROS   Patient denies fever, chills, headache, sweats, rash,  change in vision, change in hearing, cough, nausea or vomiting, urinary symptoms. All other systems are reviewed and are negative.  PHYSICAL EXAM  Patient is stable today. He is oriented to person time and place. Affect is normal. There is no jugulovenous distention. Lungs are clear. Respiratory effort is not labored. Cardiac exam reveals S1 and S2. Abdomen is soft. There is no peripheral edema. There no musculoskeletal deformities. There are no skin rashes.  Filed Vitals:   08/27/13 1352  BP: 152/62  Pulse: 65  Height: 5\' 9"  (1.753 m)  Weight: 192 lb 6.4 oz (87.272 kg)   EKG is done and reviewed by me. Patient has old right bundle branch block. There is sinus rhythm with sinus arrhythmia. There is no significant change when compared to the prior tracing.  ASSESSMENT & PLAN

## 2013-09-16 ENCOUNTER — Other Ambulatory Visit: Payer: Self-pay | Admitting: Cardiology

## 2013-09-19 ENCOUNTER — Ambulatory Visit (INDEPENDENT_AMBULATORY_CARE_PROVIDER_SITE_OTHER): Payer: Medicare Other | Admitting: Family Medicine

## 2013-09-19 DIAGNOSIS — I4892 Unspecified atrial flutter: Secondary | ICD-10-CM

## 2013-09-19 DIAGNOSIS — Z7901 Long term (current) use of anticoagulants: Secondary | ICD-10-CM

## 2013-09-19 DIAGNOSIS — I4891 Unspecified atrial fibrillation: Secondary | ICD-10-CM

## 2013-09-19 DIAGNOSIS — Z5181 Encounter for therapeutic drug level monitoring: Secondary | ICD-10-CM

## 2013-09-19 LAB — POCT INR: INR: 3

## 2013-09-20 ENCOUNTER — Other Ambulatory Visit: Payer: Self-pay | Admitting: Cardiology

## 2013-10-15 ENCOUNTER — Telehealth: Payer: Self-pay | Admitting: *Deleted

## 2013-10-15 NOTE — Telephone Encounter (Signed)
Form received from [employer] for diabetic supplies.  I contacted the patient and he verifies that this is the company that he uses.  Form is in your In Box.

## 2013-10-16 ENCOUNTER — Ambulatory Visit (HOSPITAL_COMMUNITY): Payer: Medicare Other | Attending: Cardiology

## 2013-10-16 ENCOUNTER — Encounter: Payer: Self-pay | Admitting: Cardiology

## 2013-10-16 DIAGNOSIS — E785 Hyperlipidemia, unspecified: Secondary | ICD-10-CM | POA: Diagnosis not present

## 2013-10-16 DIAGNOSIS — I6529 Occlusion and stenosis of unspecified carotid artery: Secondary | ICD-10-CM

## 2013-10-16 DIAGNOSIS — R0989 Other specified symptoms and signs involving the circulatory and respiratory systems: Secondary | ICD-10-CM | POA: Diagnosis not present

## 2013-10-16 DIAGNOSIS — E119 Type 2 diabetes mellitus without complications: Secondary | ICD-10-CM | POA: Diagnosis not present

## 2013-10-16 DIAGNOSIS — I658 Occlusion and stenosis of other precerebral arteries: Secondary | ICD-10-CM | POA: Insufficient documentation

## 2013-10-16 DIAGNOSIS — I2510 Atherosclerotic heart disease of native coronary artery without angina pectoris: Secondary | ICD-10-CM | POA: Insufficient documentation

## 2013-10-16 DIAGNOSIS — Z951 Presence of aortocoronary bypass graft: Secondary | ICD-10-CM | POA: Diagnosis not present

## 2013-10-16 DIAGNOSIS — Z87891 Personal history of nicotine dependence: Secondary | ICD-10-CM | POA: Insufficient documentation

## 2013-10-16 NOTE — Telephone Encounter (Signed)
I'll work on the hard copy.

## 2013-10-18 ENCOUNTER — Encounter: Payer: Self-pay | Admitting: Cardiology

## 2013-10-31 ENCOUNTER — Ambulatory Visit: Payer: Medicare Other

## 2013-11-04 ENCOUNTER — Ambulatory Visit (INDEPENDENT_AMBULATORY_CARE_PROVIDER_SITE_OTHER): Payer: Medicare Other | Admitting: Family Medicine

## 2013-11-04 DIAGNOSIS — I4891 Unspecified atrial fibrillation: Secondary | ICD-10-CM

## 2013-11-04 DIAGNOSIS — Z7901 Long term (current) use of anticoagulants: Secondary | ICD-10-CM

## 2013-11-04 DIAGNOSIS — I4892 Unspecified atrial flutter: Secondary | ICD-10-CM | POA: Diagnosis not present

## 2013-11-04 DIAGNOSIS — Z5181 Encounter for therapeutic drug level monitoring: Secondary | ICD-10-CM

## 2013-11-04 LAB — POCT INR: INR: 2.9

## 2013-11-10 ENCOUNTER — Other Ambulatory Visit: Payer: Self-pay | Admitting: Cardiology

## 2013-12-02 DIAGNOSIS — N039 Chronic nephritic syndrome with unspecified morphologic changes: Secondary | ICD-10-CM | POA: Diagnosis not present

## 2013-12-02 DIAGNOSIS — I10 Essential (primary) hypertension: Secondary | ICD-10-CM | POA: Diagnosis not present

## 2013-12-02 DIAGNOSIS — D631 Anemia in chronic kidney disease: Secondary | ICD-10-CM | POA: Diagnosis not present

## 2013-12-02 DIAGNOSIS — M948X9 Other specified disorders of cartilage, unspecified sites: Secondary | ICD-10-CM | POA: Diagnosis not present

## 2013-12-02 DIAGNOSIS — N183 Chronic kidney disease, stage 3 unspecified: Secondary | ICD-10-CM | POA: Diagnosis not present

## 2013-12-16 ENCOUNTER — Ambulatory Visit (INDEPENDENT_AMBULATORY_CARE_PROVIDER_SITE_OTHER): Payer: Medicare Other | Admitting: Family Medicine

## 2013-12-16 DIAGNOSIS — Z7901 Long term (current) use of anticoagulants: Secondary | ICD-10-CM | POA: Diagnosis not present

## 2013-12-16 DIAGNOSIS — I4892 Unspecified atrial flutter: Secondary | ICD-10-CM

## 2013-12-16 DIAGNOSIS — I4891 Unspecified atrial fibrillation: Secondary | ICD-10-CM

## 2013-12-16 DIAGNOSIS — Z5181 Encounter for therapeutic drug level monitoring: Secondary | ICD-10-CM | POA: Diagnosis not present

## 2013-12-16 LAB — POCT INR: INR: 2

## 2014-01-17 ENCOUNTER — Ambulatory Visit (INDEPENDENT_AMBULATORY_CARE_PROVIDER_SITE_OTHER): Payer: Medicare Other | Admitting: Cardiology

## 2014-01-17 ENCOUNTER — Encounter: Payer: Self-pay | Admitting: Cardiology

## 2014-01-17 VITALS — BP 158/74 | HR 70 | Ht 69.0 in | Wt 193.1 lb

## 2014-01-17 DIAGNOSIS — I779 Disorder of arteries and arterioles, unspecified: Secondary | ICD-10-CM

## 2014-01-17 DIAGNOSIS — D649 Anemia, unspecified: Secondary | ICD-10-CM | POA: Diagnosis not present

## 2014-01-17 DIAGNOSIS — I2510 Atherosclerotic heart disease of native coronary artery without angina pectoris: Secondary | ICD-10-CM | POA: Diagnosis not present

## 2014-01-17 DIAGNOSIS — R079 Chest pain, unspecified: Secondary | ICD-10-CM

## 2014-01-17 DIAGNOSIS — I4891 Unspecified atrial fibrillation: Secondary | ICD-10-CM

## 2014-01-17 DIAGNOSIS — I739 Peripheral vascular disease, unspecified: Secondary | ICD-10-CM

## 2014-01-17 DIAGNOSIS — E785 Hyperlipidemia, unspecified: Secondary | ICD-10-CM

## 2014-01-17 LAB — CBC WITH DIFFERENTIAL/PLATELET
BASOS ABS: 0 10*3/uL (ref 0.0–0.1)
Basophils Relative: 0.3 % (ref 0.0–3.0)
EOS PCT: 4.1 % (ref 0.0–5.0)
Eosinophils Absolute: 0.3 10*3/uL (ref 0.0–0.7)
HCT: 37.9 % — ABNORMAL LOW (ref 39.0–52.0)
Hemoglobin: 12.6 g/dL — ABNORMAL LOW (ref 13.0–17.0)
LYMPHS ABS: 1.5 10*3/uL (ref 0.7–4.0)
Lymphocytes Relative: 20.8 % (ref 12.0–46.0)
MCHC: 33.3 g/dL (ref 30.0–36.0)
MCV: 89.9 fl (ref 78.0–100.0)
MONO ABS: 0.5 10*3/uL (ref 0.1–1.0)
MONOS PCT: 6.6 % (ref 3.0–12.0)
Neutro Abs: 4.8 10*3/uL (ref 1.4–7.7)
Neutrophils Relative %: 68.2 % (ref 43.0–77.0)
PLATELETS: 173 10*3/uL (ref 150.0–400.0)
RBC: 4.22 Mil/uL (ref 4.22–5.81)
RDW: 14.9 % — AB (ref 11.5–14.6)
WBC: 7 10*3/uL (ref 4.5–10.5)

## 2014-01-17 LAB — BASIC METABOLIC PANEL
BUN: 31 mg/dL — ABNORMAL HIGH (ref 6–23)
CO2: 34 meq/L — AB (ref 19–32)
CREATININE: 1.9 mg/dL — AB (ref 0.4–1.5)
Calcium: 9.7 mg/dL (ref 8.4–10.5)
Chloride: 97 mEq/L (ref 96–112)
GFR: 35.58 mL/min — ABNORMAL LOW (ref >60.00)
GLUCOSE: 192 mg/dL — AB (ref 70–99)
Potassium: 3.5 mEq/L (ref 3.5–5.1)
SODIUM: 139 meq/L (ref 135–145)

## 2014-01-17 NOTE — Assessment & Plan Note (Signed)
Blood pressure is controlled. No change in therapy. 

## 2014-01-17 NOTE — Assessment & Plan Note (Signed)
The patient has felt some palpitations. His rhythm today is sinus with PACs. He has been under a lot of  stress. He is anticoagulated. No further workup at this time.

## 2014-01-17 NOTE — Assessment & Plan Note (Signed)
His last Doppler was done December, 2014. The right carotid is stable after endarterectomy.

## 2014-01-17 NOTE — Assessment & Plan Note (Signed)
He has had some chest discomfort. I will talk with him about the possibility of proceeding with a nuclear stress study. His last cath was September, 2011. Grafts were patent from his redo CABG. He has not had a nuclear study since that time that I can document.  As part of today's evaluation I spent greater than 25 minutes with his total care. More than half of this time has been with direct contact with him concerning all of the issues outlined above.

## 2014-01-17 NOTE — Assessment & Plan Note (Signed)
There is history of anemia. If he has not had a recent CBC this will be checked.

## 2014-01-17 NOTE — Assessment & Plan Note (Signed)
Lipids are being treated by guidelines.

## 2014-01-17 NOTE — Progress Notes (Signed)
HPI  Patient is seen for followup coronary disease and a history of paroxysmal atrial fibrillation. I saw him last October, 2014. He has had a significant stress at home. He also has had some chest discomfort and some palpitations. He's not but limited by these. In 2011, there was a full discussion about the approach to his arrhythmia. It was felt that Tikosyn or amiodarone could be considered. However he was stable. We knew that he might have some atrial fibrillation at times. However the decision was made to follow him clinically. He is anticoagulated.  No Known Allergies  Current Outpatient Prescriptions  Medication Sig Dispense Refill  . acetaminophen (TYLENOL) 500 MG tablet Take 1,000 mg by mouth daily. For pain      . amLODipine (NORVASC) 10 MG tablet TAKE 1 TABLET EVERY EVENING  90 tablet  1  . Cholecalciferol (VITAMIN D) 1000 UNITS capsule Take 2,000 Units by mouth daily.       . colchicine 0.6 MG tablet Take 0.6 mg by mouth 3 (three) times a week. For gout flare ups      . CRESTOR 20 MG tablet TAKE 1 TABLET DAILY  90 tablet  1  . fish oil-omega-3 fatty acids 1000 MG capsule Take 2 g by mouth 2 (two) times daily.        . furosemide (LASIX) 80 MG tablet Take 1 tablet (80 mg total) by mouth daily.  90 tablet  1  . insulin glargine (LANTUS) 100 UNIT/ML injection Inject 0.3 mLs (30 Units total) into the skin daily. As instructed by MD, dispense 4 vials  40 mL  3  . Insulin Syringe-Needle U-100 (INSULIN SYRINGE .3CC/31GX5/16") 31G X 5/16" 0.3 ML MISC Use daily as instructed.       . magnesium oxide (MAG-OX) 400 MG tablet Take 400 mg by mouth daily.       . metoprolol succinate (TOPROL-XL) 50 MG 24 hr tablet TAKE 1 TABLET DAILY  90 tablet  1  . Multiple Vitamin (MULTIVITAMIN WITH MINERALS) TABS Take 1 tablet by mouth daily.      . Potassium Gluconate (K-99) 595 MG CAPS Take 595 mg by mouth every evening.       . traMADol (ULTRAM) 50 MG tablet Take 1 tablet (50 mg total) by mouth every 8  (eight) hours as needed for pain.  270 tablet  1  . VIAGRA 100 MG tablet TAKE 1 TABLET 1 HOUR PRIOR TO ACTIVITY  18 tablet  5  . warfarin (COUMADIN) 7.5 MG tablet Take as directed by coumadin clinic  90 tablet  1   No current facility-administered medications for this visit.    History   Social History  . Marital Status: Widowed    Spouse Name: N/A    Number of Children: 2  . Years of Education: N/A   Occupational History  . 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 Smoking status: Former Smoker    Quit date: 11/14/1958  . Smokeless tobacco: Never Used  . Alcohol Use: No  . Drug Use: No  . Sexual Activity: Not on file   Other Topics Concern  . Not on file   Social History Narrative   From [place].  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  Widower 12/2005    Family History  Problem Relation Age of Onset  . Kidney disease Mother     Kidney failure  .  Stroke Mother   . Diabetes Mother   . Heart disease Father     MI  . Arthritis Sister   . Cancer Sister     Throat  . Heart disease Sister     MI  . Diabetes Sister   . Prostate cancer Neg Hx   . Colon cancer Neg Hx     Past Medical History  Diagnosis Date  . GERD (gastroesophageal reflux disease)   . Anemia     Secondary to acute blood loss  . Allergy     Environmental  . Hypertension   . Hyperlipidemia   . Diabetes mellitus     Type II  . Diabetic peripheral neuropathy   . RBBB (right bundle branch block)   . Macular degeneration   . Sleep apnea   . Systolic murmur   . Carotid artery disease     Doppler 09/18/2009 - 49% bilateral stenoses  . Special screening for malignant neoplasm of prostate   . Personal history of colonic polyps   . Diverticulosis of colon with hemorrhage   . Special screening for malignant neoplasms of other sites   . Benign neoplasm of colon   . Erectile dysfunction     Mild  . Diverticulosis of colon (without  mention of hemorrhage)   . Edema   . Myalgia   . PNA (pneumonia) 9/11    NSTEMI at [facility] with repeat cath, rec medical mgmt   . Atrial flutter 07/2010    September, 2011   Hospital with PNA and cath done.Marland KitchenMarland KitchenCoumadin.  Atrial flutter ablation planned, but  pt. then had atrial fibrillation,/outpatient conversion 09/08/10..NSR..plan to follow..Dr. Caryl Comes  . GI bleeding     Off aspirin because of prior GI bleed, Severe, 2009, multiple units of blood transfused, patient taken off aspirin  . Chronic kidney disease     ckd #3  . Gout   . Skin cancer     R lower leg, per derm 2012  . Atrial fibrillation     Consideration was given for atrial flutter ablation, but patient developed atrial fibrillation. Cardioversion was done. Dr. Caryl Comes decided to watch him clinically. November, 2011  . Neuropathy     Diabetic  . Carotid artery disease     49% bilateral, Doppler, November, 2010  . CAD (coronary artery disease)     Catheterization, September, 2011,  grafts patent from redo CABG,, medical therapy of coronary disease, consideration to proceeding with atrial flutter ablation  . Hx of CABG     CABG in 1995  //   redo CABG 2006  . Ejection fraction     EF 60%, echo, 2009  //   EF 65%, echo, September, 2011  . Mitral regurgitation     Mild, echo, September, 2011  . Shoulder pain     Decreasing Crestor did not affect the pain    Past Surgical History  Procedure Laterality Date  . Cardiac catheterization  2006    Nuclear..slight lateral ischemia..medical therapy  . Cardiac catheterization  08/04/2010    grafts patent from redo CABG...medical Rx and ablate Atrial flutter (LV not injected)   . Doppler echocardiography  08/2008    EF 60%  . Doppler echocardiography  08/02/2010    65-70%  . Doppler echocardiography  07/2010    MR mild  . Coronary artery bypass graft  1995     Redo  CABG 2006  . Gi bleeding  2009    Severe. multiple units of blood transfused..patient  off aspirin  . Polypectomy    .  Cataract extraction    . Vasectomy    . Tonsillectomy    . Carotid endarterectomy    . Coronary artery bypass graft    . Cholecystectomy      Patient Active Problem List   Diagnosis Date Noted  . Encounter for therapeutic drug monitoring 12/16/2013  . Right knee pain 07/25/2013  . Encounter for examination for driving license [id number]  . OA (osteoarthritis) 03/14/2013  . RBBB (right bundle branch block)   . Shoulder pain   . Neuropathy   . Carotid artery disease   . CAD (coronary artery disease)   . Hx of CABG   . GI bleeding   . Ejection fraction   . Mitral regurgitation   . Hypogonadism male 03/05/2012  . Medicare annual wellness visit, subsequent 03/05/2012  . ED (erectile dysfunction) 03/01/2012  . Gout 09/05/2011  . Epistaxis 09/05/2011  . CKD (chronic kidney disease) stage 3, GFR 30-59 ml/min 03/24/2011  . Colon cancer screening 02/24/2011  . Skin cancer 02/24/2011  . Atrial flutter 08/24/2010  . ATRIAL FIBRILLATION 08/09/2010  . MUSCLE CRAMPS, FOOT 10/28/2009  . DIABETIC PERIPHERAL NEUROPATHY 05/12/2009  . GASTROESOPHAGEAL REFLUX DISEASE 05/12/2009  . SLEEP APNEA 05/12/2009  . EDEMA 05/12/2009  . MACULAR DEGENERATION, HX OF 05/12/2009  . CAROTID ENDARTERECTOMY, HX OF 05/12/2009  . POLYPECTOMY, HX OF 05/12/2009  . CATARACT EXTRACTION, HX OF 05/12/2009  . Other acquired absence of organ 05/12/2009  . ANEMIA, SECONDARY TO ACUTE BLOOD LOSS 10/01/2008  . DIVERTICULOSIS, COLON, WITH HEMORRHAGE 10/01/2008  . COLONIC POLYPS, BENIGN, HX OF 10/01/2008  . ANEMIA 09/13/2007  . ALLERGY, ENVIRONMENTAL 03/03/2007  . DIABETES MELLITUS, TYPE II 02/05/2007  . HYPERLIPIDEMIA 02/05/2007  . HYPERTENSION 02/05/2007    ROS   Patient denies fever, chills, headache, sweats, rash, change in vision, change in hearing, cough, nausea vomiting, urinary symptoms. All other systems are reviewed and are negative.  PHYSICAL EXAM  Patient is stable today. He is oriented to person time  and place. Affect is normal. There is no jugular venous distention. Lungs are clear. Respiratory effort is nonlabored. Cardiac exam reveals S1 and S2. The rhythm is mildly irregular. The abdomen is soft. There is no significant peripheral edema. There no musculoskeletal deformities. There are no skin rashes.  Filed Vitals:   01/17/14 0955  BP: 158/74  Pulse: 70  Height: 5\' 9"  (1.753 m)  Weight: 193 lb 1.9 oz (87.599 kg)   EKG is done today and reviewed by me. He has sinus bradycardia. There is right bundle branch block. There are PACs. The rhythm is not atrial fibrillation. There is no significant EKG change since the prior tracing that I have reviewed.  ASSESSMENT & PLAN

## 2014-01-17 NOTE — Patient Instructions (Signed)
**Note De-Identified [name] Obfuscation** Your physician has requested that you have a lexiscan myoview. For further information please visit [email or web address]. Please follow instruction sheet, as given.  Your physician recommends that you return for lab work in: today  Your physician recommends that you schedule a follow-up appointment in: 6 weeks

## 2014-01-24 ENCOUNTER — Encounter: Payer: Self-pay | Admitting: Cardiology

## 2014-01-27 ENCOUNTER — Ambulatory Visit (INDEPENDENT_AMBULATORY_CARE_PROVIDER_SITE_OTHER): Payer: Medicare Other | Admitting: Family Medicine

## 2014-01-27 DIAGNOSIS — Z7901 Long term (current) use of anticoagulants: Secondary | ICD-10-CM | POA: Diagnosis not present

## 2014-01-27 DIAGNOSIS — Z5181 Encounter for therapeutic drug level monitoring: Secondary | ICD-10-CM

## 2014-01-27 DIAGNOSIS — I4892 Unspecified atrial flutter: Secondary | ICD-10-CM | POA: Diagnosis not present

## 2014-01-27 DIAGNOSIS — I4891 Unspecified atrial fibrillation: Secondary | ICD-10-CM

## 2014-01-27 LAB — POCT INR: INR: 2.7

## 2014-01-28 ENCOUNTER — Ambulatory Visit (HOSPITAL_COMMUNITY): Payer: Medicare Other | Attending: Cardiovascular Disease | Admitting: Radiology

## 2014-01-28 ENCOUNTER — Encounter: Payer: Self-pay | Admitting: Cardiovascular Disease

## 2014-01-28 VITALS — BP 124/67 | Ht 69.0 in | Wt 196.0 lb

## 2014-01-28 DIAGNOSIS — R0602 Shortness of breath: Secondary | ICD-10-CM

## 2014-01-28 DIAGNOSIS — I4891 Unspecified atrial fibrillation: Secondary | ICD-10-CM | POA: Diagnosis not present

## 2014-01-28 DIAGNOSIS — I4510 Unspecified right bundle-branch block: Secondary | ICD-10-CM

## 2014-01-28 DIAGNOSIS — I2510 Atherosclerotic heart disease of native coronary artery without angina pectoris: Secondary | ICD-10-CM | POA: Insufficient documentation

## 2014-01-28 DIAGNOSIS — R079 Chest pain, unspecified: Secondary | ICD-10-CM | POA: Diagnosis not present

## 2014-01-28 MED ORDER — TECHNETIUM TC 99M SESTAMIBI GENERIC - CARDIOLITE
11.0000 | Freq: Once | INTRAVENOUS | Status: AC | PRN
  Administered 2014-01-28: 11 via INTRAVENOUS

## 2014-01-28 MED ORDER — TECHNETIUM TC 99M SESTAMIBI GENERIC - CARDIOLITE
33.0000 | Freq: Once | INTRAVENOUS | Status: AC | PRN
  Administered 2014-01-28: 33 via INTRAVENOUS

## 2014-01-28 MED ORDER — REGADENOSON 0.4 MG/5ML IV SOLN
0.4000 mg | Freq: Once | INTRAVENOUS | Status: AC
  Administered 2014-01-28: 0.4 mg via INTRAVENOUS

## 2014-01-28 NOTE — Progress Notes (Signed)
[facility] [address] [place], [place] [address] [phone]    Cardiology Nuclear Med Study  ANTWOINE MCGUFFIE is a 80 y.o. male     MRN : [id number]     DOB: [birth year]  Procedure Date: 01/28/2014  Nuclear Med Background Indication for Stress Test:  Evaluation for Ischemia and Graft Patency History:  2010 MPI: NL EF: 61% no report, '11 ECHO: EF: 65% '12 NL GXT, CAD-MI-CABG-Redo, Cardioversion-AFIB Cardiac Risk Factors: Carotid Disease, Family History - CAD, History of Smoking, Hypertension, Lipids and IDDM  Symptoms:  Chest Pain, DOE, Palpitations and SOB   Nuclear Pre-Procedure Caffeine/Decaff Intake:  None NPO After: 6:30am   Lungs:  clear O2 Sat: 96% on room air. IV 0.9% NS with Angio Cath:  22g  IV Site: L Hand  IV Started by:  Matilde Haymaker, RN  Chest Size (in):  44 Cup Size: n/a  Height: 5\' 9"  (1.753 m)  Weight:  196 lb (88.905 kg)  BMI:  Body mass index is 28.93 kg/(m^2). Tech Comments:  No Toprol x 24 hrs    Nuclear Med Study 1 or 2 day study: 1 day  Stress Test Type:  Treadmill/Lexiscan  Reading MD: n/a  Order Authorizing Provider:  Darletta Moll  Resting Radionuclide: Technetium 34m Sestamibi  Resting Radionuclide Dose: 11.0 mCi   Stress Radionuclide:  Technetium 43m Sestamibi  Stress Radionuclide Dose: 33.0 mCi           Stress Protocol Rest HR: 54 Stress HR: 78  Rest BP: 124/67 Stress BP: 131/61  Exercise Time (min): n/a METS: n/a   Predicted Max HR: 141 bpm % Max HR: 55.32 bpm Rate Pressure Product: 10218   Dose of Adenosine (mg):  n/a Dose of Lexiscan: 0.4 mg  Dose of Atropine (mg): n/a Dose of Dobutamine: n/a mcg/kg/min (at max HR)  Stress Test Technologist: Perrin Maltese, EMT-P  Nuclear Technologist:  Charlton Amor, CNMT     Rest Procedure:  Myocardial perfusion imaging was performed at rest 45 minutes following the intravenous administration of Technetium 57m Sestamibi. Rest ECG: Sinus bradycardia,  first degree AV block, RBBB, LAFB, LVH  Stress Procedure:  The patient received IV Lexiscan 0.4 mg over 15-seconds with concurrent low level exercise and then Technetium 50m Sestamibi was injected at 30-seconds while the patient continued walking one more minute. This patient had fatigue, headache, and sob with the Lexiscan injection.  Quantitative spect images were obtained after a 45-minute delay. Stress ECG: No significant ST segment change suggestive of ischemia.  QPS Raw Data Images:  Acquisition technically good; normal left ventricular size. Stress Images:  There is decreased uptake in the lateral wall and basal anterior wall. Rest Images:  There is decreased uptake in the lateral wall (less prominent compared to the stress images. Subtraction (SDS):  These findings are consistent with prior lateral infarct with mild to moderate peri-infarct ischemia and mild ischemia in the basal anterior wall. Transient Ischemic Dilatation (Normal <1.22):  1.04 Lung/Heart Ratio (Normal <0.45):  0.40  Quantitative Gated Spect Images QGS EDV:  122 ml QGS ESV:  54 ml  Impression Exercise Capacity:  Lexiscan with no exercise. BP Response:  Normal blood pressure response. Clinical Symptoms:  There is dyspnea. ECG Impression:  No significant ST segment change suggestive of ischemia. Comparison with Prior Nuclear Study: Compared to 09/29/09, all findings are new.  Overall Impression:  Intermediate risk stress nuclear study with a moderate size, severe intensity, partially reversible lateral defect and  small, moderate intensity, reversible basal anterior defect; findings consistent with prior lateral infarct with mild to moderate peri-infarct ischemia and mild ischemia in the basal anterior wall .  LV Ejection Fraction: 55%.  LV Wall Motion:  Hypokinesis of the lateral wall and basal anterior wall.  Kirk Ruths

## 2014-02-03 ENCOUNTER — Encounter: Payer: Self-pay | Admitting: Cardiology

## 2014-02-16 ENCOUNTER — Other Ambulatory Visit: Payer: Self-pay | Admitting: Family Medicine

## 2014-03-07 ENCOUNTER — Other Ambulatory Visit: Payer: Self-pay | Admitting: Family Medicine

## 2014-03-07 ENCOUNTER — Encounter: Payer: Self-pay | Admitting: Cardiology

## 2014-03-07 ENCOUNTER — Ambulatory Visit (INDEPENDENT_AMBULATORY_CARE_PROVIDER_SITE_OTHER): Payer: Medicare Other | Admitting: Cardiology

## 2014-03-07 VITALS — BP 140/64 | HR 63 | Ht 69.0 in | Wt 198.8 lb

## 2014-03-07 DIAGNOSIS — I4891 Unspecified atrial fibrillation: Secondary | ICD-10-CM

## 2014-03-07 DIAGNOSIS — I2510 Atherosclerotic heart disease of native coronary artery without angina pectoris: Secondary | ICD-10-CM | POA: Diagnosis not present

## 2014-03-07 DIAGNOSIS — D649 Anemia, unspecified: Secondary | ICD-10-CM | POA: Diagnosis not present

## 2014-03-07 DIAGNOSIS — E785 Hyperlipidemia, unspecified: Secondary | ICD-10-CM | POA: Diagnosis not present

## 2014-03-07 DIAGNOSIS — I10 Essential (primary) hypertension: Secondary | ICD-10-CM

## 2014-03-07 DIAGNOSIS — N183 Chronic kidney disease, stage 3 unspecified: Secondary | ICD-10-CM

## 2014-03-07 NOTE — Progress Notes (Signed)
Patient ID: [name], male   DOB: 1932-09-07, 80 y.o.   MRN: [id number]    HPI  Patient is seen today to followup paroxysmal atrial fibrillation and coronary disease. I saw him last January 17, 2014. His rhythm has been stable. This has been fully assessed. The plan for now as his current therapy. Amiodarone or Tikosyn could be considered if needed. He is anticoagulated. He has known significant coronary disease. He has had CABG and a redo. He also has renal insufficiency. His most recent creatinine clearance was 36.  When I saw him last he did mention that he has some chest tightness and shortness of breath with exertion at times. Nuclear stress study was done. This shows an old large scar. There are some areas of ischemia that are peri-infarct.  Today he says he feels the same. He may have some chest tightness on a random basis. He may have some shortness of breath on walking back from his mailbox. These symptoms are stable.  No Known Allergies  Current Outpatient Prescriptions  Medication Sig Dispense Refill  . acetaminophen (TYLENOL) 500 MG tablet Take 1,000 mg by mouth daily. For pain      . amLODipine (NORVASC) 10 MG tablet TAKE 1 TABLET EVERY EVENING  90 tablet  1  . Cholecalciferol (VITAMIN D) 1000 UNITS capsule Take 2,000 Units by mouth daily.       . colchicine 0.6 MG tablet Take 0.6 mg by mouth 3 (three) times a week. For gout flare ups      . CRESTOR 20 MG tablet TAKE 1 TABLET DAILY  90 tablet  1  . fish oil-omega-3 fatty acids 1000 MG capsule Take 2 g by mouth 2 (two) times daily.        . furosemide (LASIX) 80 MG tablet Take 1 tablet (80 mg total) by mouth daily.  90 tablet  1  . insulin glargine (LANTUS) 100 UNIT/ML injection Inject 0.3 mLs (30 Units total) into the skin daily. As instructed by MD, dispense 4 vials  40 mL  3  . Insulin Syringe-Needle U-100 (INSULIN SYRINGE .3CC/31GX5/16") 31G X 5/16" 0.3 ML MISC Use daily as instructed.       . magnesium oxide (MAG-OX) 400 MG  tablet Take 400 mg by mouth daily.       . metoprolol succinate (TOPROL-XL) 50 MG 24 hr tablet TAKE 1 TABLET DAILY  90 tablet  1  . Multiple Vitamin (MULTIVITAMIN WITH MINERALS) TABS Take 1 tablet by mouth daily.      . Potassium Gluconate (K-99) 595 MG CAPS Take 595 mg by mouth every evening.       . traMADol (ULTRAM) 50 MG tablet Take 1 tablet (50 mg total) by mouth every 8 (eight) hours as needed for pain.  270 tablet  1  . VIAGRA 100 MG tablet TAKE 1 TABLET 1 HOUR PRIOR TO ACTIVITY  18 tablet  5  . warfarin (COUMADIN) 7.5 MG tablet TAKE AS DIRECTED BY COUMADIN CLINIC  90 tablet  0   No current facility-administered medications for this visit.    History   Social History  . Marital Status: Widowed    Spouse Name: N/A    Number of Children: 2  . Years of Education: N/A   Occupational History  . 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 Smoking status: Former Smoker    Quit date: 11/14/1958  . Smokeless tobacco: Never Used  . Alcohol Use:  No  . Drug Use: No  . Sexual Activity: Not on file   Other Topics Concern  . Not on file   Social History Narrative   From [place].  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  Widower 12/2005    Family History  Problem Relation Age of Onset  . Kidney disease Mother     Kidney failure  . Stroke Mother   . Diabetes Mother   . Heart disease Father     MI  . Arthritis Sister   . Cancer Sister     Throat  . Heart disease Sister     MI  . Diabetes Sister   . Prostate cancer Neg Hx   . Colon cancer Neg Hx     Past Medical History  Diagnosis Date  . GERD (gastroesophageal reflux disease)   . Anemia     Secondary to acute blood loss  . Allergy     Environmental  . Hypertension   . Hyperlipidemia   . Diabetes mellitus     Type II  . Diabetic peripheral neuropathy   . RBBB (right bundle branch block)   . Macular degeneration   . Sleep apnea   . Systolic murmur    . Carotid artery disease     Doppler 09/18/2009 - 49% bilateral stenoses  . Special screening for malignant neoplasm of prostate   . Personal history of colonic polyps   . Diverticulosis of colon with hemorrhage   . Special screening for malignant neoplasms of other sites   . Benign neoplasm of colon   . Erectile dysfunction     Mild  . Diverticulosis of colon (without mention of hemorrhage)   . Edema   . Myalgia   . PNA (pneumonia) 9/11    NSTEMI at [facility] with repeat cath, rec medical mgmt   . Atrial flutter 07/2010    September, 2011   Hospital with PNA and cath done.Marland KitchenMarland KitchenCoumadin.  Atrial flutter ablation planned, but  pt. then had atrial fibrillation,/outpatient conversion 09/08/10..NSR..plan to follow..Dr. Caryl Comes  . GI bleeding     Off aspirin because of prior GI bleed, Severe, 2009, multiple units of blood transfused, patient taken off aspirin  . Chronic kidney disease     ckd #3  . Gout   . Skin cancer     R lower leg, per derm 2012  . Atrial fibrillation     Consideration was given for atrial flutter ablation, but patient developed atrial fibrillation. Cardioversion was done. Dr. Caryl Comes decided to watch him clinically. November, 2011  . Neuropathy     Diabetic  . Carotid artery disease     49% bilateral, Doppler, November, 2010  . CAD (coronary artery disease)     Catheterization, September, 2011,  grafts patent from redo CABG,, medical therapy of coronary disease, consideration to proceeding with atrial flutter ablation  . Hx of CABG     CABG in 1995  //   redo CABG 2006  . Ejection fraction     EF 60%, echo, 2009  //   EF 65%, echo, September, 2011  . Mitral regurgitation     Mild, echo, September, 2011  . Shoulder pain     Decreasing Crestor did not affect the pain    Past Surgical History  Procedure Laterality Date  . Cardiac catheterization  2006    Nuclear..slight lateral ischemia..medical therapy  . Cardiac catheterization  08/04/2010    grafts  patent from redo  CABG...medical Rx and ablate Atrial flutter (LV not injected)   . Doppler echocardiography  08/2008    EF 60%  . Doppler echocardiography  08/02/2010    65-70%  . Doppler echocardiography  07/2010    MR mild  . Coronary artery bypass graft  1995     Redo  CABG 2006  . Gi bleeding  2009    Severe. multiple units of blood transfused..patient off aspirin  . Polypectomy    . Cataract extraction    . Vasectomy    . Tonsillectomy    . Carotid endarterectomy    . Coronary artery bypass graft    . Cholecystectomy      Patient Active Problem List   Diagnosis Date Noted  . Encounter for therapeutic drug monitoring 12/16/2013  . Right knee pain 07/25/2013  . Encounter for examination for driving license [id number]  . OA (osteoarthritis) 03/14/2013  . RBBB (right bundle branch block)   . Shoulder pain   . Neuropathy   . Carotid artery disease   . CAD (coronary artery disease)   . Hx of CABG   . GI bleeding   . Ejection fraction   . Mitral regurgitation   . Hypogonadism male 03/05/2012  . Medicare annual wellness visit, subsequent 03/05/2012  . ED (erectile dysfunction) 03/01/2012  . Gout 09/05/2011  . Epistaxis 09/05/2011  . CKD (chronic kidney disease) stage 3, GFR 30-59 ml/min 03/24/2011  . Colon cancer screening 02/24/2011  . Skin cancer 02/24/2011  . Atrial flutter 08/24/2010  . Atrial fibrillation 08/09/2010  . MUSCLE CRAMPS, FOOT 10/28/2009  . DIABETIC PERIPHERAL NEUROPATHY 05/12/2009  . GASTROESOPHAGEAL REFLUX DISEASE 05/12/2009  . SLEEP APNEA 05/12/2009  . EDEMA 05/12/2009  . MACULAR DEGENERATION, HX OF 05/12/2009  . CAROTID ENDARTERECTOMY, HX OF 05/12/2009  . POLYPECTOMY, HX OF 05/12/2009  . CATARACT EXTRACTION, HX OF 05/12/2009  . Other acquired absence of organ 05/12/2009  . ANEMIA, SECONDARY TO ACUTE BLOOD LOSS 10/01/2008  . DIVERTICULOSIS, COLON, WITH HEMORRHAGE 10/01/2008  . COLONIC POLYPS, BENIGN, HX OF 10/01/2008  . ANEMIA 09/13/2007  . ALLERGY,  ENVIRONMENTAL 03/03/2007  . DIABETES MELLITUS, TYPE II 02/05/2007  . HYPERLIPIDEMIA 02/05/2007  . HYPERTENSION 02/05/2007    ROS  Patient denies fever, chills, headache, sweats, rash, change in vision, change in hearing, cough, nausea vomiting, urinary symptoms. All other systems are reviewed and are negative.  PHYSICAL EXAM    Patient is overweight. He is oriented to person time and place. Affect is normal. There is no jugulovenous distention. Lungs are clear. Respiratory effort is not labored. Cardiac exam reveals S1 and S2. The abdomen is soft. There is no peripheral edema.  Filed Vitals:   03/07/14 1120  BP: 140/64  Pulse: 63  Height: 5\' 9"  (1.753 m)  Weight: 198 lb 12.8 oz (90.175 kg)  SpO2: 99%     ASSESSMENT & PLAN

## 2014-03-07 NOTE — Patient Instructions (Signed)
Your physician recommends that you continue on your current medications as directed. Please refer to the Current Medication list given to you today.  Your physician recommends that you schedule a follow-up appointment in: 8 weeks

## 2014-03-07 NOTE — Assessment & Plan Note (Signed)
The patient does have some evidence of ischemia on his nuclear scan. I do believe that the chest tightness that he has and some of exertional shortness of breath is probably ischemic in origin. These symptoms have been quite stable. I discussed the possibility of adding Imdur to his medications. He is sexually active and uses Viagra several times during the week. Therefore, nitrate will not be safe. I've chosen not to change his medicines. He will contact me if he has any change in symptoms. I could possibly consider Ranexa for symptoms. If there is marked change, we will proceed with cardiac catheterization. However he has had redo bypass and he has significant renal insufficiency,

## 2014-03-07 NOTE — Assessment & Plan Note (Signed)
Hemoglobin was checked, saw him last. Hemoglobin was stable at 12.6.

## 2014-03-07 NOTE — Assessment & Plan Note (Signed)
He has significant renal disease. Most recent GFR was 36. With his renal dysfunction we will have to be very careful if cardiac catheterization is needed.

## 2014-03-07 NOTE — Assessment & Plan Note (Signed)
He is receiving guidelines directed therapy with a statin

## 2014-03-07 NOTE — Assessment & Plan Note (Signed)
Rhythm is stable today. EKG is not done. No change in therapy.

## 2014-03-07 NOTE — Assessment & Plan Note (Signed)
Blood pressure is controlled. No change in therapy. 

## 2014-03-08 ENCOUNTER — Other Ambulatory Visit: Payer: Self-pay | Admitting: Cardiology

## 2014-03-10 ENCOUNTER — Telehealth: Payer: Self-pay | Admitting: Family Medicine

## 2014-03-10 ENCOUNTER — Ambulatory Visit (INDEPENDENT_AMBULATORY_CARE_PROVIDER_SITE_OTHER): Payer: Medicare Other | Admitting: Family Medicine

## 2014-03-10 DIAGNOSIS — Z5181 Encounter for therapeutic drug level monitoring: Secondary | ICD-10-CM | POA: Diagnosis not present

## 2014-03-10 DIAGNOSIS — I4891 Unspecified atrial fibrillation: Secondary | ICD-10-CM | POA: Diagnosis not present

## 2014-03-10 DIAGNOSIS — Z7901 Long term (current) use of anticoagulants: Secondary | ICD-10-CM | POA: Diagnosis not present

## 2014-03-10 DIAGNOSIS — I4892 Unspecified atrial flutter: Secondary | ICD-10-CM | POA: Diagnosis not present

## 2014-03-10 LAB — POCT INR: INR: 2.7

## 2014-03-10 NOTE — Telephone Encounter (Signed)
Relevant patient education assigned to patient using Emmi. ° °

## 2014-04-03 ENCOUNTER — Other Ambulatory Visit: Payer: Self-pay | Admitting: Cardiology

## 2014-04-21 ENCOUNTER — Ambulatory Visit (INDEPENDENT_AMBULATORY_CARE_PROVIDER_SITE_OTHER): Payer: Medicare Other | Admitting: Family Medicine

## 2014-04-21 DIAGNOSIS — Z7901 Long term (current) use of anticoagulants: Secondary | ICD-10-CM

## 2014-04-21 DIAGNOSIS — I4892 Unspecified atrial flutter: Secondary | ICD-10-CM

## 2014-04-21 DIAGNOSIS — Z5181 Encounter for therapeutic drug level monitoring: Secondary | ICD-10-CM | POA: Diagnosis not present

## 2014-04-21 DIAGNOSIS — I4891 Unspecified atrial fibrillation: Secondary | ICD-10-CM

## 2014-04-21 LAB — POCT INR: INR: 2.9

## 2014-04-30 ENCOUNTER — Other Ambulatory Visit: Payer: Self-pay | Admitting: Family Medicine

## 2014-04-30 ENCOUNTER — Encounter: Payer: Self-pay | Admitting: Cardiology

## 2014-04-30 ENCOUNTER — Ambulatory Visit (INDEPENDENT_AMBULATORY_CARE_PROVIDER_SITE_OTHER): Payer: Medicare Other | Admitting: Cardiology

## 2014-04-30 VITALS — BP 140/64 | HR 61 | Ht 69.0 in | Wt 193.0 lb

## 2014-04-30 DIAGNOSIS — E785 Hyperlipidemia, unspecified: Secondary | ICD-10-CM

## 2014-04-30 DIAGNOSIS — I4891 Unspecified atrial fibrillation: Secondary | ICD-10-CM | POA: Diagnosis not present

## 2014-04-30 DIAGNOSIS — I10 Essential (primary) hypertension: Secondary | ICD-10-CM | POA: Diagnosis not present

## 2014-04-30 DIAGNOSIS — Z7901 Long term (current) use of anticoagulants: Secondary | ICD-10-CM | POA: Insufficient documentation

## 2014-04-30 DIAGNOSIS — I2510 Atherosclerotic heart disease of native coronary artery without angina pectoris: Secondary | ICD-10-CM | POA: Diagnosis not present

## 2014-04-30 DIAGNOSIS — K922 Gastrointestinal hemorrhage, unspecified: Secondary | ICD-10-CM

## 2014-04-30 MED ORDER — AMLODIPINE BESYLATE 10 MG PO TABS: ORAL_TABLET | ORAL | Status: DC

## 2014-04-30 MED ORDER — FUROSEMIDE 80 MG PO TABS: 80.0000 mg | ORAL_TABLET | Freq: Every day | ORAL | Status: DC

## 2014-04-30 MED ORDER — METOPROLOL SUCCINATE ER 50 MG PO TB24: 50.0000 mg | ORAL_TABLET | Freq: Every day | ORAL | Status: DC

## 2014-04-30 NOTE — Patient Instructions (Signed)
Your physician recommends that you continue on your current medications as directed. Please refer to the Current Medication list given to you today.  Your physician recommends that you schedule a follow-up appointment in: 3 months  

## 2014-04-30 NOTE — Assessment & Plan Note (Signed)
Blood pressure is controlled. No change in therapy. 

## 2014-04-30 NOTE — Assessment & Plan Note (Signed)
The patient is feeling well at this time. I cannot be sure what happened the evening several weeks ago when he had some chest discomfort. We both wonder if he could have infarcted a very small vessel. He feels well now. All of his other parameters are stable. My inclination is to follow him without any changes.  As part of today's evaluation I spent greater than 25 minutes with his total care. More than half of this time is been with direct contact with him discussing all of these issues. We talked at length about his chest discomfort in the followup of his EKG.

## 2014-04-30 NOTE — Assessment & Plan Note (Signed)
The patient is on Coumadin for his atrial fibrillation and atrial flutter history.

## 2014-04-30 NOTE — Assessment & Plan Note (Signed)
Patient had a major GI bleed in the past. He was taken off aspirin.

## 2014-04-30 NOTE — Assessment & Plan Note (Signed)
The patient has had some atrial flutter in the past. Currently he has sinus rhythm. No change in therapy.

## 2014-04-30 NOTE — Assessment & Plan Note (Signed)
His lipids are treated appropriately. No change in therapy. 

## 2014-04-30 NOTE — Progress Notes (Signed)
Patient ID: [name], male   DOB: [birth year], 80 y.o.   MRN: [id number]    HPI  Patient is seen today to followup paroxysmal atrial fibrillation and coronary disease. I saw him last March 07, 2014. He's had CABG and redo. He has some renal insufficiency. When I saw him last, he was having some chest tightness with exertion. There was some evidence of ischemia on his nuclear scan. We considered adding a nitrate but he is sexually active with Viagra. I mentioned that Ranexa could be tried. We decided to watch him for a period of time to see how he was doing. His overall stress level has reduced. He thinks that he is not having the symptom with walking is much. In addition approximately 3 weeks ago in the middle the night he had some chest discomfort. He was quite concerned about it. He did not have nitroglycerin. This symptom lasted approximately an hour. He says that since that time he has had no significant recurring discomfort. In fact, he says he feels the best that he hasn't 5 years.  No Known Allergies  Current Outpatient Prescriptions  Medication Sig Dispense Refill  . acetaminophen (TYLENOL) 500 MG tablet Take 1,000 mg by mouth daily. For pain      . amLODipine (NORVASC) 10 MG tablet TAKE 1 TABLET EVERY EVENING  90 tablet  0  . Cholecalciferol (VITAMIN D) 1000 UNITS capsule Take 2,000 Units by mouth daily.       . colchicine 0.6 MG tablet Take 0.6 mg by mouth 3 (three) times a week. For gout flare ups      . CRESTOR 20 MG tablet TAKE 1 TABLET DAILY  90 tablet  0  . fish oil-omega-3 fatty acids 1000 MG capsule Take 2 g by mouth 2 (two) times daily.        . furosemide (LASIX) 80 MG tablet TAKE 1 TABLET DAILY  90 tablet  0  . Insulin Syringe-Needle U-100 (INSULIN SYRINGE .3CC/31GX5/16") 31G X 5/16" 0.3 ML MISC Use daily as instructed.       [name] LANTUS 100 UNIT/ML injection INJECT 30 UNITS INTO THE SKIN DAILY AS INSTRUCTED BY MD  4 vial  2  . magnesium oxide (MAG-OX) 400 MG tablet Take 400  mg by mouth daily.       . metoprolol succinate (TOPROL-XL) 50 MG 24 hr tablet TAKE 1 TABLET DAILY  90 tablet  0  . Multiple Vitamin (MULTIVITAMIN WITH MINERALS) TABS Take 1 tablet by mouth daily.      . Potassium Gluconate (K-99) 595 MG CAPS Take 595 mg by mouth every evening.       . traMADol (ULTRAM) 50 MG tablet Take 1 tablet (50 mg total) by mouth every 8 (eight) hours as needed for pain.  270 tablet  1  . VIAGRA 100 MG tablet TAKE 1 TABLET 1 HOUR PRIOR TO ACTIVITY  18 tablet  5  . warfarin (COUMADIN) 7.5 MG tablet TAKE AS DIRECTED BY COUMADIN CLINIC  90 tablet  0   No current facility-administered medications for this visit.    History   Social History  . Marital Status: Widowed    Spouse Name: N/A    Number of Children: 2  . Years of Education: N/A   Occupational History  . 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 Smoking status: Former Smoker    Quit date: 11/14/1958  . Smokeless tobacco: Never Used  . Alcohol  Use: No  . Drug Use: No  . Sexual Activity: Not on file   Other Topics Concern  . Not on file   Social History Narrative   From [place].  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  Widower 12/2005    Family History  Problem Relation Age of Onset  . Kidney disease Mother     Kidney failure  . Stroke Mother   . Diabetes Mother   . Heart disease Father     MI  . Arthritis Sister   . Cancer Sister     Throat  . Heart disease Sister     MI  . Diabetes Sister   . Prostate cancer Neg Hx   . Colon cancer Neg Hx     Past Medical History  Diagnosis Date  . GERD (gastroesophageal reflux disease)   . Anemia     Secondary to acute blood loss  . Allergy     Environmental  . Hypertension   . Hyperlipidemia   . Diabetes mellitus     Type II  . Diabetic peripheral neuropathy   . RBBB (right bundle branch block)   . Macular degeneration   . Sleep apnea   . Systolic murmur   . Carotid  artery disease     Doppler 09/18/2009 - 49% bilateral stenoses  . Special screening for malignant neoplasm of prostate   . Personal history of colonic polyps   . Diverticulosis of colon with hemorrhage   . Special screening for malignant neoplasms of other sites   . Benign neoplasm of colon   . Erectile dysfunction     Mild  . Diverticulosis of colon (without mention of hemorrhage)   . Edema   . Myalgia   . PNA (pneumonia) 9/11    NSTEMI at [facility] with repeat cath, rec medical mgmt   . Atrial flutter 07/2010    September, 2011   Hospital with PNA and cath done.Marland KitchenMarland KitchenCoumadin.  Atrial flutter ablation planned, but  pt. then had atrial fibrillation,/outpatient conversion 09/08/10..NSR..plan to follow..Dr. Caryl Comes  . GI bleeding     Off aspirin because of prior GI bleed, Severe, 2009, multiple units of blood transfused, patient taken off aspirin  . Chronic kidney disease     ckd #3  . Gout   . Skin cancer     R lower leg, per derm 2012  . Atrial fibrillation     Consideration was given for atrial flutter ablation, but patient developed atrial fibrillation. Cardioversion was done. Dr. Caryl Comes decided to watch him clinically. November, 2011  . Neuropathy     Diabetic  . Carotid artery disease     49% bilateral, Doppler, November, 2010  . CAD (coronary artery disease)     Catheterization, September, 2011,  grafts patent from redo CABG,, medical therapy of coronary disease, consideration to proceeding with atrial flutter ablation  . Hx of CABG     CABG in 1995  //   redo CABG 2006  . Ejection fraction     EF 60%, echo, 2009  //   EF 65%, echo, September, 2011  . Mitral regurgitation     Mild, echo, September, 2011  . Shoulder pain     Decreasing Crestor did not affect the pain    Past Surgical History  Procedure Laterality Date  . Cardiac catheterization  2006    Nuclear..slight lateral ischemia..medical therapy  . Cardiac catheterization  08/04/2010  grafts patent from redo CABG...medical  Rx and ablate Atrial flutter (LV not injected)   . Doppler echocardiography  08/2008    EF 60%  . Doppler echocardiography  08/02/2010    65-70%  . Doppler echocardiography  07/2010    MR mild  . Coronary artery bypass graft  1995     Redo  CABG 2006  . Gi bleeding  2009    Severe. multiple units of blood transfused..patient off aspirin  . Polypectomy    . Cataract extraction    . Vasectomy    . Tonsillectomy    . Carotid endarterectomy    . Coronary artery bypass graft    . Cholecystectomy      Patient Active Problem List   Diagnosis Date Noted  . Encounter for therapeutic drug monitoring 12/16/2013  . Right knee pain 07/25/2013  . Encounter for examination for driving license [id number]  . OA (osteoarthritis) 03/14/2013  . RBBB (right bundle branch block)   . Shoulder pain   . Neuropathy   . Carotid artery disease   . CAD (coronary artery disease)   . Hx of CABG   . GI bleeding   . Ejection fraction   . Mitral regurgitation   . Hypogonadism male 03/05/2012  . Medicare annual wellness visit, subsequent 03/05/2012  . ED (erectile dysfunction) 03/01/2012  . Gout 09/05/2011  . Epistaxis 09/05/2011  . CKD (chronic kidney disease) stage 3, GFR 30-59 ml/min 03/24/2011  . Colon cancer screening 02/24/2011  . Skin cancer 02/24/2011  . Atrial flutter 08/24/2010  . Atrial fibrillation 08/09/2010  . MUSCLE CRAMPS, FOOT 10/28/2009  . DIABETIC PERIPHERAL NEUROPATHY 05/12/2009  . GASTROESOPHAGEAL REFLUX DISEASE 05/12/2009  . SLEEP APNEA 05/12/2009  . EDEMA 05/12/2009  . MACULAR DEGENERATION, HX OF 05/12/2009  . CAROTID ENDARTERECTOMY, HX OF 05/12/2009  . POLYPECTOMY, HX OF 05/12/2009  . CATARACT EXTRACTION, HX OF 05/12/2009  . Other acquired absence of organ 05/12/2009  . ANEMIA, SECONDARY TO ACUTE BLOOD LOSS 10/01/2008  . DIVERTICULOSIS, COLON, WITH HEMORRHAGE 10/01/2008  . COLONIC POLYPS, BENIGN, HX OF 10/01/2008  . ANEMIA 09/13/2007  . ALLERGY, ENVIRONMENTAL 03/03/2007    . DIABETES MELLITUS, TYPE II 02/05/2007  . HYPERLIPIDEMIA 02/05/2007  . HYPERTENSION 02/05/2007    ROS   Patient denies fever, chills, headache, sweats, rash, change in vision, change in hearing, cough, nausea vomiting, urinary symptoms. All other systems are reviewed and are negative.  PHYSICAL EXAM  Patient is overweight. He is oriented to person time and place. Affect is normal. Head is atraumatic. Sclera and conjunctiva are normal. There is no jugulovenous distention. Lungs are clear. Respiratory effort is nonlabored. Cardiac exam reveals S1 and S2. The abdomen is soft. There is no peripheral edema. There no musculoskeletal deformities. There are no skin rashes area  Filed Vitals:   04/30/14 1605  BP: 140/64  Pulse: 61  Height: 5\' 9"  (1.753 m)  Weight: 193 lb (87.544 kg)  SpO2: 96%   EKG is done today and reviewed by me. I have compared it to the prior tracing. There is no significant change. Patient is right bundle-branch block with left axis. He has sinus rhythm.  ASSESSMENT & PLAN

## 2014-05-06 DIAGNOSIS — C4441 Basal cell carcinoma of skin of scalp and neck: Secondary | ICD-10-CM | POA: Diagnosis not present

## 2014-05-06 DIAGNOSIS — D235 Other benign neoplasm of skin of trunk: Secondary | ICD-10-CM | POA: Diagnosis not present

## 2014-05-06 DIAGNOSIS — L570 Actinic keratosis: Secondary | ICD-10-CM | POA: Diagnosis not present

## 2014-05-06 DIAGNOSIS — B009 Herpesviral infection, unspecified: Secondary | ICD-10-CM | POA: Diagnosis not present

## 2014-05-12 ENCOUNTER — Encounter: Payer: Self-pay | Admitting: Family Medicine

## 2014-05-12 ENCOUNTER — Ambulatory Visit (INDEPENDENT_AMBULATORY_CARE_PROVIDER_SITE_OTHER): Payer: Medicare Other | Admitting: Family Medicine

## 2014-05-12 VITALS — BP 152/58 | HR 64 | Temp 98.1°F | Wt 198.5 lb

## 2014-05-12 DIAGNOSIS — I2510 Atherosclerotic heart disease of native coronary artery without angina pectoris: Secondary | ICD-10-CM

## 2014-05-12 DIAGNOSIS — IMO0002 Reserved for concepts with insufficient information to code with codable children: Secondary | ICD-10-CM

## 2014-05-12 DIAGNOSIS — S76312A Strain of muscle, fascia and tendon of the posterior muscle group at thigh level, left thigh, initial encounter: Secondary | ICD-10-CM

## 2014-05-12 MED ORDER — TRAMADOL HCL 50 MG PO TABS: 50.0000 mg | ORAL_TABLET | Freq: Four times a day (QID) | ORAL | Status: DC | PRN

## 2014-05-12 NOTE — Patient Instructions (Addendum)
Use the tramadol as needed, up to 5 times a day.  Try icing and using a wrap.  Update me in a few days.   We may need to get you to PT.  Take care.

## 2014-05-12 NOTE — Progress Notes (Signed)
Pre visit review using our clinic review tool, if applicable. No additional management support is needed unless otherwise documented below in the visit note.  Was taking some groceries in and missed a step.  He thought he was on the bottom step, but wasn't.  Landed on his L hip.  Bruised.  No LOC.  Was able to get up and walk, weight bear.  Fall was 05/08/14.  No head trauma.  Still sore, pain has gotten worse over the last few days, in the L hamstring, spasms on standing.  He was backing down the stairs when he fell, with his L leg extended on the bottom step, knee extended, stretching his hamstring.   Meds, vitals, and allergies reviewed.   ROS: See HPI.  Otherwise, noncontributory.  nad rrr ctab abd Back w/o midline pain L hip with normal ROM but L hamstring sore- but no failure.  Able to bear weight.   Bruising noted on L buttock.  Distally nv intact on gross testing.  Not ttp on bony prominences.

## 2014-05-12 NOTE — Assessment & Plan Note (Signed)
minimal bruising, should slowly resolve.  D/w pt.  No need to image at this point.  Tramadol for pain, ice prn.  We may need to send him to PT. Okay to try a wrap on the leg in the meantime.  He agrees.

## 2014-05-19 ENCOUNTER — Telehealth: Payer: Self-pay

## 2014-05-19 NOTE — Telephone Encounter (Signed)
Left detailed message on voicemail.  

## 2014-05-19 NOTE — Telephone Encounter (Signed)
If he'd like to hold off on PT for another week that is okay with me.  Let us know if we need to do anything else in the meantime.  Thanks.

## 2014-05-19 NOTE — Telephone Encounter (Signed)
Pt was seen on 05/12/14 with lt hamstring pulled muscle; pt said ace wrap has helped a lot and tramadol also helps pain control; now pain level is 5. Pt feels much better but  Going up steps is very challenging for pt. Pt would like to wait another week before starting PT. Pt request cb with Dr Josefine Class thoughts.

## 2014-05-21 ENCOUNTER — Other Ambulatory Visit: Payer: Self-pay | Admitting: Cardiology

## 2014-05-23 ENCOUNTER — Telehealth: Payer: Self-pay | Admitting: Family Medicine

## 2014-05-23 DIAGNOSIS — S76312A Strain of muscle, fascia and tendon of the posterior muscle group at thigh level, left thigh, initial encounter: Secondary | ICD-10-CM

## 2014-05-23 NOTE — Telephone Encounter (Signed)
Patient received your message and he would like to start physical therapy.  Patient prefers [name].

## 2014-05-25 NOTE — Telephone Encounter (Signed)
Ordered. Thanks

## 2014-05-25 NOTE — Addendum Note (Signed)
Addended by: Tonia Ghent on: 05/25/2014 12:02 PM   Modules accepted: Orders

## 2014-06-02 ENCOUNTER — Ambulatory Visit (INDEPENDENT_AMBULATORY_CARE_PROVIDER_SITE_OTHER): Payer: Medicare Other | Admitting: Family Medicine

## 2014-06-02 DIAGNOSIS — I4892 Unspecified atrial flutter: Secondary | ICD-10-CM

## 2014-06-02 DIAGNOSIS — Z5181 Encounter for therapeutic drug level monitoring: Secondary | ICD-10-CM | POA: Diagnosis not present

## 2014-06-02 DIAGNOSIS — Z7901 Long term (current) use of anticoagulants: Secondary | ICD-10-CM | POA: Diagnosis not present

## 2014-06-02 DIAGNOSIS — I4891 Unspecified atrial fibrillation: Secondary | ICD-10-CM

## 2014-06-02 LAB — POCT INR: INR: 3.6

## 2014-06-08 ENCOUNTER — Emergency Department (HOSPITAL_COMMUNITY): Payer: Medicare Other

## 2014-06-08 ENCOUNTER — Encounter (HOSPITAL_COMMUNITY): Payer: Self-pay | Admitting: Emergency Medicine

## 2014-06-08 ENCOUNTER — Inpatient Hospital Stay (HOSPITAL_COMMUNITY)
Admission: EM | Admit: 2014-06-08 | Discharge: 2014-06-19 | DRG: 246 | Disposition: A | Discharge status: Home / Self Care | Payer: Medicare Other | Attending: Cardiology | Admitting: Cardiology

## 2014-06-08 DIAGNOSIS — H3530 Unspecified macular degeneration: Secondary | ICD-10-CM | POA: Diagnosis present

## 2014-06-08 DIAGNOSIS — N179 Acute kidney failure, unspecified: Secondary | ICD-10-CM | POA: Diagnosis not present

## 2014-06-08 DIAGNOSIS — Z85828 Personal history of other malignant neoplasm of skin: Secondary | ICD-10-CM

## 2014-06-08 DIAGNOSIS — I2789 Other specified pulmonary heart diseases: Secondary | ICD-10-CM | POA: Diagnosis present

## 2014-06-08 DIAGNOSIS — E119 Type 2 diabetes mellitus without complications: Secondary | ICD-10-CM | POA: Diagnosis present

## 2014-06-08 DIAGNOSIS — I25710 Atherosclerosis of autologous vein coronary artery bypass graft(s) with unstable angina pectoris: Secondary | ICD-10-CM

## 2014-06-08 DIAGNOSIS — T502X5A Adverse effect of carbonic-anhydrase inhibitors, benzothiadiazides and other diuretics, initial encounter: Secondary | ICD-10-CM | POA: Diagnosis not present

## 2014-06-08 DIAGNOSIS — I25810 Atherosclerosis of coronary artery bypass graft(s) without angina pectoris: Secondary | ICD-10-CM | POA: Diagnosis present

## 2014-06-08 DIAGNOSIS — E876 Hypokalemia: Secondary | ICD-10-CM | POA: Diagnosis not present

## 2014-06-08 DIAGNOSIS — I119 Hypertensive heart disease without heart failure: Secondary | ICD-10-CM | POA: Diagnosis present

## 2014-06-08 DIAGNOSIS — E1142 Type 2 diabetes mellitus with diabetic polyneuropathy: Secondary | ICD-10-CM | POA: Diagnosis present

## 2014-06-08 DIAGNOSIS — I110 Hypertensive heart disease with heart failure: Secondary | ICD-10-CM

## 2014-06-08 DIAGNOSIS — R791 Abnormal coagulation profile: Secondary | ICD-10-CM | POA: Diagnosis present

## 2014-06-08 DIAGNOSIS — Y832 Surgical operation with anastomosis, bypass or graft as the cause of abnormal reaction of the patient, or of later complication, without mention of misadventure at the time of the procedure: Secondary | ICD-10-CM | POA: Diagnosis present

## 2014-06-08 DIAGNOSIS — M1710 Unilateral primary osteoarthritis, unspecified knee: Secondary | ICD-10-CM | POA: Diagnosis present

## 2014-06-08 DIAGNOSIS — R079 Chest pain, unspecified: Secondary | ICD-10-CM

## 2014-06-08 DIAGNOSIS — I4891 Unspecified atrial fibrillation: Secondary | ICD-10-CM | POA: Diagnosis present

## 2014-06-08 DIAGNOSIS — R059 Cough, unspecified: Secondary | ICD-10-CM | POA: Diagnosis not present

## 2014-06-08 DIAGNOSIS — Z789 Other specified health status: Secondary | ICD-10-CM

## 2014-06-08 DIAGNOSIS — Z7901 Long term (current) use of anticoagulants: Secondary | ICD-10-CM | POA: Diagnosis not present

## 2014-06-08 DIAGNOSIS — N183 Chronic kidney disease, stage 3 unspecified: Secondary | ICD-10-CM | POA: Diagnosis present

## 2014-06-08 DIAGNOSIS — I10 Essential (primary) hypertension: Secondary | ICD-10-CM | POA: Diagnosis present

## 2014-06-08 DIAGNOSIS — D649 Anemia, unspecified: Secondary | ICD-10-CM | POA: Diagnosis not present

## 2014-06-08 DIAGNOSIS — Z955 Presence of coronary angioplasty implant and graft: Secondary | ICD-10-CM

## 2014-06-08 DIAGNOSIS — I509 Heart failure, unspecified: Secondary | ICD-10-CM | POA: Diagnosis present

## 2014-06-08 DIAGNOSIS — Z951 Presence of aortocoronary bypass graft: Secondary | ICD-10-CM | POA: Diagnosis not present

## 2014-06-08 DIAGNOSIS — I200 Unstable angina: Secondary | ICD-10-CM

## 2014-06-08 DIAGNOSIS — M109 Gout, unspecified: Secondary | ICD-10-CM | POA: Diagnosis present

## 2014-06-08 DIAGNOSIS — E118 Type 2 diabetes mellitus with unspecified complications: Secondary | ICD-10-CM

## 2014-06-08 DIAGNOSIS — Z833 Family history of diabetes mellitus: Secondary | ICD-10-CM | POA: Diagnosis not present

## 2014-06-08 DIAGNOSIS — R509 Fever, unspecified: Secondary | ICD-10-CM | POA: Diagnosis not present

## 2014-06-08 DIAGNOSIS — I059 Rheumatic mitral valve disease, unspecified: Secondary | ICD-10-CM | POA: Diagnosis present

## 2014-06-08 DIAGNOSIS — R195 Other fecal abnormalities: Secondary | ICD-10-CM

## 2014-06-08 DIAGNOSIS — Z87891 Personal history of nicotine dependence: Secondary | ICD-10-CM

## 2014-06-08 DIAGNOSIS — I484 Atypical atrial flutter: Secondary | ICD-10-CM

## 2014-06-08 DIAGNOSIS — Z8719 Personal history of other diseases of the digestive system: Secondary | ICD-10-CM

## 2014-06-08 DIAGNOSIS — Z823 Family history of stroke: Secondary | ICD-10-CM | POA: Diagnosis not present

## 2014-06-08 DIAGNOSIS — I2584 Coronary atherosclerosis due to calcified coronary lesion: Secondary | ICD-10-CM

## 2014-06-08 DIAGNOSIS — I4510 Unspecified right bundle-branch block: Secondary | ICD-10-CM | POA: Diagnosis present

## 2014-06-08 DIAGNOSIS — I2582 Chronic total occlusion of coronary artery: Secondary | ICD-10-CM | POA: Diagnosis present

## 2014-06-08 DIAGNOSIS — I214 Non-ST elevation (NSTEMI) myocardial infarction: Secondary | ICD-10-CM

## 2014-06-08 DIAGNOSIS — IMO0002 Reserved for concepts with insufficient information to code with codable children: Secondary | ICD-10-CM

## 2014-06-08 DIAGNOSIS — X58XXXA Exposure to other specified factors, initial encounter: Secondary | ICD-10-CM | POA: Diagnosis present

## 2014-06-08 DIAGNOSIS — I5031 Acute diastolic (congestive) heart failure: Secondary | ICD-10-CM | POA: Diagnosis not present

## 2014-06-08 DIAGNOSIS — R0989 Other specified symptoms and signs involving the circulatory and respiratory systems: Secondary | ICD-10-CM | POA: Diagnosis not present

## 2014-06-08 DIAGNOSIS — E785 Hyperlipidemia, unspecified: Secondary | ICD-10-CM | POA: Diagnosis present

## 2014-06-08 DIAGNOSIS — I130 Hypertensive heart and chronic kidney disease with heart failure and stage 1 through stage 4 chronic kidney disease, or unspecified chronic kidney disease: Secondary | ICD-10-CM | POA: Diagnosis present

## 2014-06-08 DIAGNOSIS — I252 Old myocardial infarction: Secondary | ICD-10-CM

## 2014-06-08 DIAGNOSIS — R7989 Other specified abnormal findings of blood chemistry: Secondary | ICD-10-CM

## 2014-06-08 DIAGNOSIS — Z794 Long term (current) use of insulin: Secondary | ICD-10-CM | POA: Diagnosis not present

## 2014-06-08 DIAGNOSIS — D62 Acute posthemorrhagic anemia: Secondary | ICD-10-CM | POA: Diagnosis not present

## 2014-06-08 DIAGNOSIS — T82897A Other specified complication of cardiac prosthetic devices, implants and grafts, initial encounter: Secondary | ICD-10-CM | POA: Diagnosis not present

## 2014-06-08 DIAGNOSIS — G4730 Sleep apnea, unspecified: Secondary | ICD-10-CM | POA: Diagnosis present

## 2014-06-08 DIAGNOSIS — I2510 Atherosclerotic heart disease of native coronary artery without angina pectoris: Secondary | ICD-10-CM | POA: Diagnosis present

## 2014-06-08 DIAGNOSIS — E1149 Type 2 diabetes mellitus with other diabetic neurological complication: Secondary | ICD-10-CM | POA: Diagnosis present

## 2014-06-08 DIAGNOSIS — R778 Other specified abnormalities of plasma proteins: Secondary | ICD-10-CM

## 2014-06-08 DIAGNOSIS — I4892 Unspecified atrial flutter: Secondary | ICD-10-CM | POA: Diagnosis present

## 2014-06-08 DIAGNOSIS — K219 Gastro-esophageal reflux disease without esophagitis: Secondary | ICD-10-CM | POA: Diagnosis present

## 2014-06-08 DIAGNOSIS — I25708 Atherosclerosis of coronary artery bypass graft(s), unspecified, with other forms of angina pectoris: Secondary | ICD-10-CM

## 2014-06-08 DIAGNOSIS — Z8249 Family history of ischemic heart disease and other diseases of the circulatory system: Secondary | ICD-10-CM

## 2014-06-08 DIAGNOSIS — T82867S Thrombosis of cardiac prosthetic devices, implants and grafts, sequela: Secondary | ICD-10-CM

## 2014-06-08 DIAGNOSIS — K922 Gastrointestinal hemorrhage, unspecified: Secondary | ICD-10-CM

## 2014-06-08 DIAGNOSIS — N189 Chronic kidney disease, unspecified: Secondary | ICD-10-CM

## 2014-06-08 DIAGNOSIS — J189 Pneumonia, unspecified organism: Secondary | ICD-10-CM

## 2014-06-08 DIAGNOSIS — R05 Cough: Secondary | ICD-10-CM | POA: Diagnosis not present

## 2014-06-08 DIAGNOSIS — Z8679 Personal history of other diseases of the circulatory system: Secondary | ICD-10-CM

## 2014-06-08 DIAGNOSIS — I480 Paroxysmal atrial fibrillation: Secondary | ICD-10-CM

## 2014-06-08 DIAGNOSIS — I25110 Atherosclerotic heart disease of native coronary artery with unstable angina pectoris: Secondary | ICD-10-CM

## 2014-06-08 LAB — I-STAT TROPONIN, ED: TROPONIN I, POC: 0.07 ng/mL (ref 0.00–0.08)

## 2014-06-08 LAB — BASIC METABOLIC PANEL
ANION GAP: 13 (ref 5–15)
BUN: 23 mg/dL (ref 6–23)
CHLORIDE: 104 meq/L (ref 96–112)
CO2: 28 meq/L (ref 19–32)
Calcium: 9.5 mg/dL (ref 8.4–10.5)
Creatinine, Ser: 1.61 mg/dL — ABNORMAL HIGH (ref 0.50–1.35)
GFR calc Af Amer: 45 mL/min — ABNORMAL LOW (ref >90)
GFR calc non Af Amer: 39 mL/min — ABNORMAL LOW (ref >90)
Glucose, Bld: 179 mg/dL — ABNORMAL HIGH (ref 70–99)
Potassium: 4.2 mEq/L (ref 3.7–5.3)
SODIUM: 145 meq/L (ref 137–147)

## 2014-06-08 LAB — CBC
HCT: 32.2 % — ABNORMAL LOW (ref 39.0–52.0)
HEMOGLOBIN: 10.3 g/dL — AB (ref 13.0–17.0)
MCH: 29.2 pg (ref 26.0–34.0)
MCHC: 32 g/dL (ref 30.0–36.0)
MCV: 91.2 fL (ref 78.0–100.0)
Platelets: 186 10*3/uL (ref 150–400)
RBC: 3.53 MIL/uL — AB (ref 4.22–5.81)
RDW: 14.3 % (ref 11.5–15.5)
WBC: 6.9 10*3/uL (ref 4.0–10.5)

## 2014-06-08 LAB — PROTIME-INR
INR: 2.04 — ABNORMAL HIGH (ref 0.00–1.49)
PROTHROMBIN TIME: 23 s — AB (ref 11.6–15.2)

## 2014-06-08 LAB — PRO B NATRIURETIC PEPTIDE: PRO B NATRI PEPTIDE: 2684 pg/mL — AB (ref 0–450)

## 2014-06-08 MED ORDER — ADULT MULTIVITAMIN W/MINERALS CH
1.0000 | ORAL_TABLET | Freq: Every day | ORAL | Status: DC
  Administered 2014-06-09 – 2014-06-19 (×8): 1 via ORAL
  Filled 2014-06-08 (×11): qty 1

## 2014-06-08 MED ORDER — TRAMADOL HCL 50 MG PO TABS
50.0000 mg | ORAL_TABLET | Freq: Four times a day (QID) | ORAL | Status: DC | PRN
  Administered 2014-06-09: 50 mg via ORAL
  Filled 2014-06-08: qty 1

## 2014-06-08 MED ORDER — NITROGLYCERIN 0.4 MG SL SUBL
0.4000 mg | SUBLINGUAL_TABLET | SUBLINGUAL | Status: DC | PRN
  Administered 2014-06-09 – 2014-06-16 (×13): 0.4 mg via SUBLINGUAL
  Filled 2014-06-08 (×8): qty 1

## 2014-06-08 MED ORDER — METOPROLOL SUCCINATE ER 50 MG PO TB24
50.0000 mg | ORAL_TABLET | Freq: Every day | ORAL | Status: DC
  Administered 2014-06-09 – 2014-06-19 (×11): 50 mg via ORAL
  Filled 2014-06-08 (×12): qty 1

## 2014-06-08 MED ORDER — AMLODIPINE BESYLATE 10 MG PO TABS
10.0000 mg | ORAL_TABLET | Freq: Every evening | ORAL | Status: DC
  Administered 2014-06-09 – 2014-06-18 (×10): 10 mg via ORAL
  Filled 2014-06-08 (×11): qty 1

## 2014-06-08 MED ORDER — INSULIN ASPART 100 UNIT/ML ~~LOC~~ SOLN
0.0000 [IU] | Freq: Three times a day (TID) | SUBCUTANEOUS | Status: DC
  Administered 2014-06-09: 3 [IU] via SUBCUTANEOUS
  Administered 2014-06-10: 2 [IU] via SUBCUTANEOUS
  Administered 2014-06-10: 3 [IU] via SUBCUTANEOUS
  Administered 2014-06-12: 2 [IU] via SUBCUTANEOUS
  Administered 2014-06-13: 3 [IU] via SUBCUTANEOUS
  Administered 2014-06-15: 2 [IU] via SUBCUTANEOUS
  Administered 2014-06-15: 3 [IU] via SUBCUTANEOUS
  Administered 2014-06-16 (×2): 2 [IU] via SUBCUTANEOUS
  Administered 2014-06-16 – 2014-06-17 (×2): 3 [IU] via SUBCUTANEOUS
  Administered 2014-06-18 (×2): 2 [IU] via SUBCUTANEOUS

## 2014-06-08 MED ORDER — ASPIRIN EC 81 MG PO TBEC
81.0000 mg | DELAYED_RELEASE_TABLET | Freq: Every day | ORAL | Status: DC
  Administered 2014-06-11 – 2014-06-19 (×8): 81 mg via ORAL
  Filled 2014-06-08 (×11): qty 1

## 2014-06-08 MED ORDER — ACETAMINOPHEN 325 MG PO TABS: 650.0000 mg | ORAL_TABLET | ORAL | Status: DC | PRN

## 2014-06-08 MED ORDER — ATORVASTATIN CALCIUM 40 MG PO TABS
40.0000 mg | ORAL_TABLET | Freq: Every day | ORAL | Status: DC
  Administered 2014-06-09 – 2014-06-15 (×6): 40 mg via ORAL
  Filled 2014-06-08 (×8): qty 1

## 2014-06-08 MED ORDER — ACETAMINOPHEN 500 MG PO TABS
1000.0000 mg | ORAL_TABLET | Freq: Every day | ORAL | Status: DC
  Administered 2014-06-09 – 2014-06-19 (×10): 1000 mg via ORAL
  Filled 2014-06-08 (×11): qty 2

## 2014-06-08 MED ORDER — ONDANSETRON HCL 4 MG/2ML IJ SOLN
4.0000 mg | Freq: Four times a day (QID) | INTRAMUSCULAR | Status: DC | PRN
  Administered 2014-06-14: 4 mg via INTRAVENOUS
  Filled 2014-06-08: qty 2

## 2014-06-08 MED ORDER — FUROSEMIDE 80 MG PO TABS
80.0000 mg | ORAL_TABLET | ORAL | Status: DC
  Administered 2014-06-09 – 2014-06-10 (×2): 80 mg via ORAL
  Filled 2014-06-08 (×4): qty 1

## 2014-06-08 MED ORDER — COLCHICINE 0.6 MG PO TABS
0.6000 mg | ORAL_TABLET | ORAL | Status: DC
  Administered 2014-06-09 – 2014-06-18 (×4): 0.6 mg via ORAL
  Filled 2014-06-08 (×7): qty 1

## 2014-06-08 MED ORDER — INSULIN GLARGINE 100 UNIT/ML ~~LOC~~ SOLN
25.0000 [IU] | Freq: Every day | SUBCUTANEOUS | Status: DC
  Administered 2014-06-09 – 2014-06-18 (×10): 25 [IU] via SUBCUTANEOUS
  Filled 2014-06-08 (×12): qty 0.25

## 2014-06-08 NOTE — ED Notes (Signed)
C/o intermittent sharp pain in center of chest x 2 weeks.  Pt describes pain as an "ice cream headache" feeling in chest.  States it is hard to take a deep breath.  Denies nausea and vomiting.

## 2014-06-08 NOTE — ED Provider Notes (Signed)
CSN: [id number]     Arrival date & time 06/08/14  1817 History   First MD Initiated Contact with Patient 06/08/14 1841     Chief Complaint  Patient presents with  . Chest Pain     (Consider location/radiation/quality/duration/timing/severity/associated sxs/prior Treatment) HPI Comments: Patient is an 80 year old male with extensive cardiac history including bypass graft x3 in 1999, then again in 2006. He presents today with complaints of sharp pains in the center of his chest which occur with exertion. He states that he walks between 100-200 feet, he will reliably develop discomfort in the front of his chest. He denies that this is associated with any shortness of breath, nausea, or diaphoresis.  He is a patient of Dr. [name] and reports having had a normal stress test of some type in March of this year.  Patient is a 80 y.o. male presenting with chest pain. The history is provided by the patient.  Chest Pain Pain location:  Substernal area Pain quality: stabbing   Pain radiates to:  Does not radiate Pain radiates to the back: no   Pain severity:  Moderate Timing:  Intermittent Progression:  Worsening Chronicity:  Recurrent Context: not breathing   Relieved by:  Nothing Worsened by:  Exertion   Past Medical History  Diagnosis Date  . GERD (gastroesophageal reflux disease)   . Anemia     Secondary to acute blood loss  . Allergy     Environmental  . Hypertension   . Hyperlipidemia   . Diabetes mellitus     Type II  . Diabetic peripheral neuropathy   . RBBB (right bundle branch block)   . Macular degeneration   . Sleep apnea   . Systolic murmur   . Carotid artery disease     Doppler 09/18/2009 - 49% bilateral stenoses  . Special screening for malignant neoplasm of prostate   . Personal history of colonic polyps   . Diverticulosis of colon with hemorrhage   . Special screening for malignant neoplasms of other sites   . Benign neoplasm of colon   . Erectile dysfunction     Mild  . Diverticulosis of colon (without mention of hemorrhage)   . Edema   . Myalgia   . PNA (pneumonia) 9/11    NSTEMI at [facility] with repeat cath, rec medical mgmt   . Atrial flutter 07/2010    September, 2011   Hospital with PNA and cath done.Marland KitchenMarland KitchenCoumadin.  Atrial flutter ablation planned, but  pt. then had atrial fibrillation,/outpatient conversion 09/08/10..NSR..plan to follow..Dr. Caryl Comes  . GI bleeding     Off aspirin because of prior GI bleed, Severe, 2009, multiple units of blood transfused, patient taken off aspirin  . Chronic kidney disease     ckd #3  . Gout   . Skin cancer     R lower leg, per derm 2012  . Atrial fibrillation     Consideration was given for atrial flutter ablation, but patient developed atrial fibrillation. Cardioversion was done. Dr. Caryl Comes decided to watch him clinically. November, 2011  . Neuropathy     Diabetic  . Carotid artery disease     49% bilateral, Doppler, November, 2010  . CAD (coronary artery disease)     Catheterization, September, 2011,  grafts patent from redo CABG,, medical therapy of coronary disease, consideration to proceeding with atrial flutter ablation  . Hx of CABG     CABG in 1995  //   redo CABG 2006  . Ejection fraction  EF 60%, echo, 2009  //   EF 65%, echo, September, 2011  . Mitral regurgitation     Mild, echo, September, 2011  . Shoulder pain     Decreasing Crestor did not affect the pain   Past Surgical History  Procedure Laterality Date  . Cardiac catheterization  2006    Nuclear..slight lateral ischemia..medical therapy  . Cardiac catheterization  08/04/2010    grafts patent from redo CABG...medical Rx and ablate Atrial flutter (LV not injected)   . Doppler echocardiography  08/2008    EF 60%  . Doppler echocardiography  08/02/2010    65-70%  . Doppler echocardiography  07/2010    MR mild  . Coronary artery bypass graft  1995     Redo  CABG 2006  . Gi bleeding  2009    Severe. multiple units of blood  transfused..patient off aspirin  . Polypectomy    . Cataract extraction    . Vasectomy    . Tonsillectomy    . Carotid endarterectomy    . Coronary artery bypass graft    . Cholecystectomy     Family History  Problem Relation Age of Onset  . Kidney disease Mother     Kidney failure  . Stroke Mother   . Diabetes Mother   . Heart disease Father     MI  . Arthritis Sister   . Cancer Sister     Throat  . Heart disease Sister     MI  . Diabetes Sister   . Prostate cancer Neg Hx   . Colon cancer Neg Hx    History  Substance Use Topics  . Smoking status: Former Smoker    Quit date: 11/14/1958  . Smokeless tobacco: Never Used  . Alcohol Use: No    Review of Systems  Cardiovascular: Positive for chest pain.  All other systems reviewed and are negative.     Allergies  Review of patient's allergies indicates no known allergies.  Home Medications   Prior to Admission medications   Medication Sig Start Date End Date Taking? Authorizing Provider  acetaminophen (TYLENOL) 500 MG tablet Take 1,000 mg by mouth daily. For pain    Historical Provider, MD  amLODipine (NORVASC) 10 MG tablet TAKE 1 TABLET EVERY EVENING 04/30/14   Carlena Bjornstad, MD  Cholecalciferol (VITAMIN D) 1000 UNITS capsule Take 2,000 Units by mouth daily.     Historical Provider, MD  colchicine 0.6 MG tablet Take 0.6 mg by mouth 3 (three) times a week. For gout flare ups    Historical Provider, MD  CRESTOR 20 MG tablet TAKE 1 TABLET DAILY 05/21/14   Darlin Coco, MD  fish oil-omega-3 fatty acids 1000 MG capsule Take 2 g by mouth 2 (two) times daily.      Historical Provider, MD  furosemide (LASIX) 80 MG tablet Take 1 tablet (80 mg total) by mouth daily. 04/30/14   Carlena Bjornstad, MD  Insulin Syringe-Needle U-100 (INSULIN SYRINGE .3CC/31GX5/16") 31G X 5/16" 0.3 ML MISC Use daily as instructed.     Historical Provider, MD  LANTUS 100 UNIT/ML injection INJECT 30 UNITS INTO THE SKIN DAILY AS INSTRUCTED BY MD  03/07/14   Tonia Ghent, MD  magnesium oxide (MAG-OX) 400 MG tablet Take 400 mg by mouth daily.     Historical Provider, MD  metoprolol succinate (TOPROL-XL) 50 MG 24 hr tablet Take 1 tablet (50 mg total) by mouth daily. Take with or immediately following a meal. 04/30/14  Carlena Bjornstad, MD  Multiple Vitamin (MULTIVITAMIN WITH MINERALS) TABS Take 1 tablet by mouth daily.    Historical Provider, MD  Potassium Gluconate (K-99) 595 MG CAPS Take 595 mg by mouth every evening.     Historical Provider, MD  traMADol (ULTRAM) 50 MG tablet Take 1 tablet (50 mg total) by mouth every 6 (six) hours as needed. 05/12/14   Tonia Ghent, MD  VIAGRA 100 MG tablet TAKE 1 TABLET 1 HOUR PRIOR TO ACTIVITY 01/07/13   Tonia Ghent, MD  warfarin (COUMADIN) 7.5 MG tablet TAKE AS DIRECTED BY COUMADIN CLINIC 04/30/14   Tonia Ghent, MD   BP 143/95  Pulse 58  Temp(Src) 98.5 F (36.9 C) (Oral)  Resp 16  SpO2 98% Physical Exam  Nursing note and vitals reviewed. Constitutional: He is oriented to person, place, and time. He appears well-developed and well-nourished. No distress.  HENT:  Head: Normocephalic and atraumatic.  Mouth/Throat: Oropharynx is clear and moist.  Neck: Normal range of motion. Neck supple.  Cardiovascular: Normal rate, regular rhythm and normal heart sounds.   No murmur heard. Pulmonary/Chest: Effort normal and breath sounds normal. No respiratory distress. He has no wheezes.  Abdominal: Soft. Bowel sounds are normal. He exhibits no distension. There is no tenderness.  Musculoskeletal: Normal range of motion. He exhibits no edema.  Neurological: He is alert and oriented to person, place, and time.  Skin: Skin is warm and dry. He is not diaphoretic.    ED Course  Procedures (including critical care time) Labs Review Labs Reviewed  [place], ED    Imaging Review No results found.   Date: 06/09/2014   Rate: 60  Rhythm: sinus rhythm  QRS Axis: left   Intervals: prolonged pr interval  ST/T Wave abnormalities: nonspecific t wave abnormality  Conduction Disutrbances:none  Narrative Interpretation:   Old EKG Reviewed: unchanged    MDM   Final diagnoses:  None    Patient with extensive cardiac history.  Presents with chest discomfort on exertion for the past week.  EKG is unchanged and troponin is 0.7.  Dr. Wynonia Lawman from cardiology has consulted and will be admitted to his service.    Veryl Speak, MD 06/09/14 [phone]

## 2014-06-08 NOTE — H&P (Signed)
History and Physical   Admit date: 06/08/2014 Name:  [name] Medical record number: [id number] DOB/Age:  [birth year]  80 y.o. male  Referring Physician: [name]  Primary Cardiologist: Dr. [name]  Chief complaint/reason for admission: Chest pain  HPI:  This 80 year old male has a history of redo coronary bypass graft in 2006 for this grafts being patent and 2011. At that time there was a high-grade stenosis between the insertion site of the graft to the PDA and the posterior lateral branch and the circumflex wasn't bypassed at that time due to occlusion of the previous grafts. He saw Dr. [name] a month or so ago and was noted to have some worsening angina. A decision was made of the time to observe it but he has noted increasing frequency of chest discomfort over the past several days. He began to have discomfort in the evening described more as a sharp pain such as an ice cream ache in his chest that would awaken him from sleep. He also noticed more frequent pain when going to the mailbox. At that time he had not wanted to use nitroglycerin because of his use of Viagra. His family found out about the symptoms to be nursed him to come to the emergency room. He had chest discomfort occurring at rest earlier today.  He did note some dark stools last week and his INR was somewhat elevated and his Coumadin was held for a couple of days. Hemoglobin is down a little bit. He has no PND, orthopnea or claudication.    Past Medical History  Diagnosis Date  . GERD (gastroesophageal reflux disease)   . Anemia     Secondary to acute blood loss  . Hypertension   . Hyperlipidemia   . Diabetes mellitus     Type II  . Diabetic peripheral neuropathy   . RBBB (right bundle branch block)   . Macular degeneration   . Sleep apnea   . Carotid artery disease     Doppler 09/18/2009 - 49% bilateral stenoses  . Personal history of colonic polyps   . Diverticulosis of colon with hemorrhage   .  Erectile dysfunction     Mild  . PNA (pneumonia) 9/11    NSTEMI at [facility] with repeat cath, rec medical mgmt   . Atrial flutter 07/2010    September, 2011   Hospital with PNA and cath done.Marland KitchenMarland KitchenCoumadin.  Atrial flutter ablation planned, but  pt. then had atrial fibrillation,/outpatient conversion 09/08/10..NSR..plan to follow..Dr. Caryl Comes  . GI bleeding     Off aspirin because of prior GI bleed, Severe, 2009, multiple units of blood transfused, patient taken off aspirin  . Chronic kidney disease     ckd #3  . Gout   . Skin cancer     R lower leg, per derm 2012  . Atrial fibrillation     Consideration was given for atrial flutter ablation, but patient developed atrial fibrillation. Cardioversion was done. Dr. Caryl Comes decided to watch him clinically. November, 2011  . Carotid artery disease     49% bilateral, Doppler, November, 2010  . CAD (coronary artery disease)     Catheterization, September, 2011,  grafts patent from redo CABG,, medical therapy of coronary disease, consideration to proceeding with atrial flutter ablation  . Hx of CABG     CABG in 1995  //   redo CABG 2006  . Ejection fraction     EF 60%, echo, 2009  //   EF 65%, echo, September,  2011  . Mitral regurgitation     Mild, echo, September, 2011  . Shoulder pain     Decreasing Crestor did not affect the pain       Past Surgical History  Procedure Laterality Date  . Cardiac catheterization  2006    Nuclear..slight lateral ischemia..medical therapy  . Cardiac catheterization  08/04/2010    grafts patent from redo CABG...medical Rx and ablate Atrial flutter (LV not injected)   . Doppler echocardiography  08/2008    EF 60%  . Doppler echocardiography  08/02/2010    65-70%  . Doppler echocardiography  07/2010    MR mild  . Coronary artery bypass graft  1995     Redo  CABG 2006  . Gi bleeding  2009    Severe. multiple units of blood transfused..patient off aspirin  . Polypectomy    . Cataract extraction    . Vasectomy    .  Tonsillectomy    . Carotid endarterectomy    . Coronary artery bypass graft    . Cholecystectomy    .  Allergies: has No Known Allergies.   Medications: Prior to Admission medications   Medication Sig Start Date End Date Taking? Authorizing Provider  acetaminophen (TYLENOL) 500 MG tablet Take 1,000 mg by mouth daily. For pain   Yes Historical Provider, MD  amLODipine (NORVASC) 10 MG tablet Take 10 mg by mouth every evening.   Yes Historical Provider, MD  Cholecalciferol (VITAMIN D) 1000 UNITS capsule Take 2,000 Units by mouth daily.    Yes Historical Provider, MD  colchicine 0.6 MG tablet Take 0.6 mg by mouth 3 (three) times a week. For gout flare ups   Yes Historical Provider, MD  fish oil-omega-3 fatty acids 1000 MG capsule Take 2 g by mouth 2 (two) times daily.     Yes Historical Provider, MD  furosemide (LASIX) 80 MG tablet Take 80 mg by mouth daily. Except Saturdays and Sundays   Yes Historical Provider, MD  insulin glargine (LANTUS) 100 UNIT/ML injection Inject 25-30 Units into the skin daily. As instructed by MD   Yes Historical Provider, MD  magnesium oxide (MAG-OX) 400 MG tablet Take 400 mg by mouth daily.    Yes Historical Provider, MD  metoprolol succinate (TOPROL-XL) 50 MG 24 hr tablet Take 1 tablet (50 mg total) by mouth daily. Take with or immediately following a meal. 04/30/14  Yes Carlena Bjornstad, MD  Multiple Vitamin (MULTIVITAMIN WITH MINERALS) TABS Take 1 tablet by mouth daily.   Yes Historical Provider, MD  Potassium Gluconate (K-99) 595 MG CAPS Take 595 mg by mouth every evening.    Yes Historical Provider, MD  rosuvastatin (CRESTOR) 20 MG tablet Take 20 mg by mouth daily.   Yes Historical Provider, MD  sildenafil (VIAGRA) 100 MG tablet Take 100 mg by mouth as needed (prior to sexual activity).   Yes Historical Provider, MD  traMADol (ULTRAM) 50 MG tablet Take 1 tablet (50 mg total) by mouth every 6 (six) hours as needed. 05/12/14  Yes Tonia Ghent, MD  warfarin  (COUMADIN) 7.5 MG tablet Take 3.75-7.5 mg by mouth See admin instructions. Take 7.5 mg (1 tablet) every day EXCEPT on MWF. Take 3.75 mg (0.5 tablet) on MWF.   Yes Historical Provider, MD  Insulin Syringe-Needle U-100 (INSULIN SYRINGE .3CC/31GX5/16") 31G X 5/16" 0.3 ML MISC Use daily as instructed.     Historical Provider, MD    Family History:  Family Status  Relation Status Death Age  .  Mother Deceased 110    Kidney failure, CVA, DM  . Father Deceased 75    MI at 7  . Sister Alive   . Sister Alive     Social History:   reports that he quit smoking about 55 years ago. He has never used smokeless tobacco. He reports that he does not drink alcohol or use illicit drugs.   History   Social History Narrative   From [place].  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  Widower 12/2005     Review of Systems:  Wears hearing aids, history of macular degeneration in the past. He has significant sensory diabetic peripheral neuropathy. He has a prior history of GI bleeding and had some dark stools earlier in the week and his Coumadin was withheld. This has since cleared. He has chronic anemia. Significant arthritis of his knees requiring tramadol. Other than as noted above, the remainder of the review of systems is normal  Physical Exam: BP 144/56  Pulse 60  Temp(Src) 98.5 F (36.9 C) (Oral)  Resp 17  SpO2 98% General appearance: Pleasant male in no acute distress Head: Normocephalic, without obvious abnormality, atraumatic Eyes: conjunctivae/corneas clear. PERRL, EOM's intact. Fundi not examined, Ears: hearing aid present in right ear  Neck: no adenopathy, no carotid bruit, no JVD, supple, symmetrical, trachea midline and Previous healed carotid endarterectomy scar Lungs: clear to auscultation bilaterally, healed median sternotomy scar Heart: regular rate and rhythm, S1, S2 normal, no murmur, click, rub or gallop Abdomen: soft, non-tender; bowel  sounds normal; no masses,  no organomegaly Rectal: deferred Extremities: Previous saphenous harvesting scars noted in right leg, no edema Pulses: 2+ and symmetric Skin: Skin color, texture, turgor normal. No rashes or lesions Neurologic: Grossly normal  Labs: CBC  Recent Labs  06/08/14 1834  WBC 6.9  RBC 3.53*  HGB 10.3*  HCT 32.2*  PLT 186  MCV 91.2  MCH 29.2  MCHC 32.0  RDW 14.3   CMP   Recent Labs  06/08/14 1834  NA 145  K 4.2  CL 104  CO2 28  GLUCOSE 179*  BUN 23  CREATININE 1.61*  CALCIUM 9.5  GFRNONAA 39*  GFRAA 45*   BNP (last 3 results)  Recent Labs  06/08/14 1827  PROBNP 2684.0*   Cardiac Panel (last 3 results) Troponin (Point of Care Test)  Recent Labs  06/08/14 1848  TROPIPOC 0.07   Thyroid  Lab Results  Component Value Date   TSH 4.92 02/21/2011    EKG: Sinus rhythm, right bundle branch block, left axis deviation  Radiology: No acute abnormality   IMPRESSIONS: 1. Unstable angina pectoris 2. Coronary artery disease with previous redo bypass grafting with potential sites of ischemia the circumflex distribution in the posterior lateral branch-grafts previously patent in 2011 3. Hypertensive heart disease 4. Hyperlipidemia 5. Diabetes with peripheral neuropathy 6. History of gastrointestinal bleeding 7. Anemia 8. Paroxysmal atrial fibrillation currently in sinus rhythm 9. Stage III chronic kidney disease  PLAN: Withhold warfarin and follow serial CBCs because his hemoglobin has dropped some. May need to have repeat catheterization. Begin heparin when INR less than 2  Signed: W. Doristine Church MD [facility] Card[facility]  06/08/2014, 10:19 PM

## 2014-06-09 ENCOUNTER — Encounter (HOSPITAL_COMMUNITY): Payer: Self-pay

## 2014-06-09 DIAGNOSIS — E1142 Type 2 diabetes mellitus with diabetic polyneuropathy: Secondary | ICD-10-CM

## 2014-06-09 DIAGNOSIS — R079 Chest pain, unspecified: Secondary | ICD-10-CM

## 2014-06-09 DIAGNOSIS — I4891 Unspecified atrial fibrillation: Secondary | ICD-10-CM

## 2014-06-09 DIAGNOSIS — N183 Chronic kidney disease, stage 3 unspecified: Secondary | ICD-10-CM

## 2014-06-09 DIAGNOSIS — D649 Anemia, unspecified: Secondary | ICD-10-CM

## 2014-06-09 DIAGNOSIS — E1149 Type 2 diabetes mellitus with other diabetic neurological complication: Secondary | ICD-10-CM

## 2014-06-09 DIAGNOSIS — I2510 Atherosclerotic heart disease of native coronary artery without angina pectoris: Secondary | ICD-10-CM

## 2014-06-09 DIAGNOSIS — I200 Unstable angina: Secondary | ICD-10-CM

## 2014-06-09 LAB — COMPREHENSIVE METABOLIC PANEL
ALT: 26 U/L (ref 0–53)
ANION GAP: 10 (ref 5–15)
AST: 25 U/L (ref 0–37)
Albumin: 3.8 g/dL (ref 3.5–5.2)
Alkaline Phosphatase: 79 U/L (ref 39–117)
BUN: 22 mg/dL (ref 6–23)
CALCIUM: 9.4 mg/dL (ref 8.4–10.5)
CO2: 29 meq/L (ref 19–32)
CREATININE: 1.6 mg/dL — AB (ref 0.50–1.35)
Chloride: 105 mEq/L (ref 96–112)
GFR, EST AFRICAN AMERICAN: 45 mL/min — AB (ref >90)
GFR, EST NON AFRICAN AMERICAN: 39 mL/min — AB (ref >90)
GLUCOSE: 153 mg/dL — AB (ref 70–99)
Potassium: 3.7 mEq/L (ref 3.7–5.3)
Sodium: 144 mEq/L (ref 137–147)
TOTAL PROTEIN: 7.2 g/dL (ref 6.0–8.3)
Total Bilirubin: 0.3 mg/dL (ref 0.3–1.2)

## 2014-06-09 LAB — CBC
HCT: 32.7 % — ABNORMAL LOW (ref 39.0–52.0)
HCT: 35.1 % — ABNORMAL LOW (ref 39.0–52.0)
HEMOGLOBIN: 10.5 g/dL — AB (ref 13.0–17.0)
HEMOGLOBIN: 11 g/dL — AB (ref 13.0–17.0)
MCH: 28.8 pg (ref 26.0–34.0)
MCH: 29.2 pg (ref 26.0–34.0)
MCHC: 31.3 g/dL (ref 30.0–36.0)
MCHC: 32.1 g/dL (ref 30.0–36.0)
MCV: 91.9 fL (ref 78.0–100.0)
MCV: 91.1 fL (ref 78.0–100.0)
PLATELETS: 197 10*3/uL (ref 150–400)
Platelets: 168 10*3/uL (ref 150–400)
RBC: 3.59 MIL/uL — ABNORMAL LOW (ref 4.22–5.81)
RBC: 3.82 MIL/uL — AB (ref 4.22–5.81)
RDW: 14.4 % (ref 11.5–15.5)
RDW: 14.3 % (ref 11.5–15.5)
WBC: 8.3 10*3/uL (ref 4.0–10.5)
WBC: 8.6 10*3/uL (ref 4.0–10.5)

## 2014-06-09 LAB — TROPONIN I
Troponin I: <0.3 ng/mL (ref <0.30)
Troponin I: <0.3 ng/mL (ref <0.30)

## 2014-06-09 LAB — PROTIME-INR
INR: 1.87 — AB (ref 0.00–1.49)
INR: 1.95 — ABNORMAL HIGH (ref 0.00–1.49)
Prothrombin Time: 21.5 seconds — ABNORMAL HIGH (ref 11.6–15.2)
Prothrombin Time: 22.2 seconds — ABNORMAL HIGH (ref 11.6–15.2)

## 2014-06-09 LAB — GLUCOSE, CAPILLARY
GLUCOSE-CAPILLARY: 117 mg/dL — AB (ref 70–99)
Glucose-Capillary: 172 mg/dL — ABNORMAL HIGH (ref 70–99)
Glucose-Capillary: 124 mg/dL — ABNORMAL HIGH (ref 70–99)
Glucose-Capillary: 108 mg/dL — ABNORMAL HIGH (ref 70–99)

## 2014-06-09 LAB — HEPARIN LEVEL (UNFRACTIONATED)
HEPARIN UNFRACTIONATED: 0.49 [IU]/mL (ref 0.30–0.70)
Heparin Unfractionated: 0.57 IU/mL (ref 0.30–0.70)

## 2014-06-09 MED ORDER — SODIUM CHLORIDE 0.9 % IV SOLN: 250.0000 mL | INTRAVENOUS | Status: DC | PRN

## 2014-06-09 MED ORDER — HEPARIN (PORCINE) IN NACL 100-0.45 UNIT/ML-% IJ SOLN
1100.0000 [IU]/h | INTRAMUSCULAR | Status: DC
  Administered 2014-06-09: 1100 [IU]/h via INTRAVENOUS
  Filled 2014-06-09 (×2): qty 250

## 2014-06-09 MED ORDER — SODIUM CHLORIDE 0.9 % IJ SOLN
3.0000 mL | INTRAMUSCULAR | Status: DC | PRN
Start: 2014-06-09 — End: 2014-06-11

## 2014-06-09 MED ORDER — SODIUM CHLORIDE 0.9 % IV SOLN
INTRAVENOUS | Status: AC
  Administered 2014-06-10: 04:00:00 via INTRAVENOUS

## 2014-06-09 MED ORDER — SODIUM CHLORIDE 0.9 % IJ SOLN
3.0000 mL | Freq: Two times a day (BID) | INTRAMUSCULAR | Status: DC
  Administered 2014-06-10 (×2): 3 mL via INTRAVENOUS

## 2014-06-09 MED ORDER — ASPIRIN 81 MG PO CHEW
81.0000 mg | CHEWABLE_TABLET | ORAL | Status: AC
  Administered 2014-06-10: 81 mg via ORAL
  Filled 2014-06-09: qty 1

## 2014-06-09 NOTE — Progress Notes (Signed)
ANTICOAGULATION CONSULT NOTE -follow up  Pharmacy Consult for Heparin  Indication: chest pain/ACS, afib  No Known Allergies  Patient Measurements: Height: 5\' 9"  (175.3 cm) Weight: 191 lb 3.2 oz (86.728 kg) IBW/kg (Calculated) : 70.7  Vital Signs: Temp: 97.9 F (36.6 C) (07/27 0434) Temp src: Oral (07/27 0434) BP: 125/61 mmHg (07/27 0510) Pulse Rate: 60 (07/27 0510)  Labs:  Recent Labs  06/08/14 1834 06/09/14 0055 06/09/14 0445 06/09/14 1200  HGB 10.3* 11.0*  --   --   HCT 32.2* 35.1*  --   --   PLT 186 197  --   --   LABPROT 23.0* 21.5*  --  22.2*  INR 2.04* 1.87*  --  1.95*  HEPARINUNFRC  --   --   --  0.49  CREATININE 1.61* 1.60*  --   --   TROPONINI  --  <0.30 <0.30  --     Estimated Creatinine Clearance: 40.2 ml/min (by C-G formula based on Cr of 1.6).   Medical History: Past Medical History  Diagnosis Date  . GERD (gastroesophageal reflux disease)   . Anemia     Secondary to acute blood loss  . Hypertension   . Hyperlipidemia   . Diabetes mellitus     Type II  . Diabetic peripheral neuropathy   . RBBB (right bundle branch block)   . Macular degeneration   . Sleep apnea   . Carotid artery disease     Doppler 09/18/2009 - 49% bilateral stenoses  . Personal history of colonic polyps   . Diverticulosis of colon with hemorrhage   . Erectile dysfunction     Mild  . PNA (pneumonia) 9/11    NSTEMI at [facility] with repeat cath, rec medical mgmt   . Atrial flutter 07/2010    September, 2011   Hospital with PNA and cath done.Marland KitchenMarland KitchenCoumadin.  Atrial flutter ablation planned, but  pt. then had atrial fibrillation,/outpatient conversion 09/08/10..NSR..plan to follow..Dr. Caryl Comes  . GI bleeding     Off aspirin because of prior GI bleed, Severe, 2009, multiple units of blood transfused, patient taken off aspirin  . Chronic kidney disease     ckd #3  . Gout   . Skin cancer     R lower leg, per derm 2012  . Atrial fibrillation     Consideration was given for atrial  flutter ablation, but patient developed atrial fibrillation. Cardioversion was done. Dr. Caryl Comes decided to watch him clinically. November, 2011  . Carotid artery disease     49% bilateral, Doppler, November, 2010  . CAD (coronary artery disease)     Catheterization, September, 2011,  grafts patent from redo CABG,, medical therapy of coronary disease, consideration to proceeding with atrial flutter ablation  . Hx of CABG     CABG in 1995  //   redo CABG 2006  . Ejection fraction     EF 60%, echo, 2009  //   EF 65%, echo, September, 2011  . Mitral regurgitation     Mild, echo, September, 2011  . Shoulder pain     Decreasing Crestor did not affect the pain    Medications:  Warfarin PTA  Assessment: 80 y/o M on IV heparin drip for CP . Prior to admission he was on coumadin which is currently on hold. Today the INR is 1.95. Heparin level is therapeutic at 0.49 on rate of 1100 units/hr heparin infusion.  Hgb 11, stable and PLTC stable at 197K. No bleeding documented.  Goal of Therapy:  Heparin level 0.3-0.7 units/ml Monitor platelets by anticoagulation protocol: Yes   Plan:  Continue IV heparin 1100 units/h Recheck heparin level in ~6 hrs to confirm therapeutic. Daily heparin level and CBC  Nicole Cella, RPh Clinical Pharmacist Pager: [phone] 06/09/2014,12:50 PM

## 2014-06-09 NOTE — Progress Notes (Signed)
     SUBJECTIVE: No complaints this am.   BP 125/61  Pulse 60  Temp(Src) 97.9 F (36.6 C) (Oral)  Resp 16  Ht 5\' 9"  (1.753 m)  Wt 191 lb 3.2 oz (86.728 kg)  BMI 28.22 kg/m2  SpO2 95% No intake or output data in the 24 hours ending 06/09/14 0918  PHYSICAL EXAM General: Well developed, well nourished, in no acute distress. Alert and oriented x 3.  Psych:  Good affect, responds appropriately Neck: No JVD. No masses noted.  Lungs: Clear bilaterally with no wheezes or rhonci noted.  Heart: RRR with no murmurs noted. Abdomen: Bowel sounds are present. Soft, non-tender.  Extremities: No lower extremity edema.   LABS: Basic Metabolic Panel:  Recent Labs  06/08/14 1834 06/09/14 0055  NA 145 144  K 4.2 3.7  CL 104 105  CO2 28 29  GLUCOSE 179* 153*  BUN 23 22  CREATININE 1.61* 1.60*  CALCIUM 9.5 9.4   CBC:  Recent Labs  06/08/14 1834 06/09/14 0055  WBC 6.9 8.6  HGB 10.3* 11.0*  HCT 32.2* 35.1*  MCV 91.2 91.9  PLT 186 197   Cardiac Enzymes:  Recent Labs  06/09/14 0055 06/09/14 0445  TROPONINI <0.30 <0.30   Current Meds: . acetaminophen  1,000 mg Oral Daily  . amLODipine  10 mg Oral QPM  . aspirin EC  81 mg Oral Daily  . atorvastatin  40 mg Oral q1800  . colchicine  0.6 mg Oral Once per day on Mon Wed Fri  . furosemide  80 mg Oral Once per day on Mon Tue Wed Thu Fri  . insulin aspart  0-15 Units Subcutaneous TID WC  . insulin glargine  25 Units Subcutaneous QHS  . metoprolol succinate  50 mg Oral Daily  . multivitamin with minerals  1 tablet Oral Daily   ASSESSMENT AND PLAN:  1. Unstable angina/CAD: Prior CABG. Pt admitted with progressive chest pain. Cardiac markers are negative. Cardiac cath has been discussed with the patient. INR is 1.87 this am. Will continue heparin drip today. Continue ASA, statin and beta blocker. NPO at midnight for cardiac cath in am tomorrow if INR <1.7.   2. HTN: BP well controlled. No changes.   3. Hyperlipidemia:  Continue statin.   4. Diabetes: Continue home regimen.   5. Anemia: H/H stable.   8. Paroxysmal atrial fibrillation: currently in sinus rhythm   9. Stage III chronic kidney disease: Renal function stable.   Plan cath in am 06/10/14 if renal function stable, INR <1.7.    [name]  7/27/20159:18 AM

## 2014-06-09 NOTE — Care Management Note (Signed)
    Page 1 of 1   06/19/2014     3:14:55 PM CARE MANAGEMENT NOTE 06/19/2014  Patient:  [name], [name]   Account Number:  [id number]  Date Initiated:  06/09/2014  Documentation initiated by:  AMERSON,JULIE  Subjective/Objective Assessment:   Pt adm on 06/08/14 with unstable angina.  PTA, pt lives alone, has girlfriend.     Action/Plan:   Will follow for dc needs as pt progresses.   Anticipated DC Date:  06/19/2014   Anticipated DC Plan:  [place]  CM consult      Choice offered to / List presented to:             Status of service:  Completed, signed off Medicare Important Message given?  YES (If response is "NO", the following Medicare IM given date fields will be blank) Date Medicare IM given:  06/11/2014 Medicare IM given by:  AMERSON,JULIE Date Additional Medicare IM given:  06/19/2014 Additional Medicare IM given by:  Marvetta Gibbons  Discharge Disposition:  HOME/SELF CARE  Per UR Regulation:  Reviewed for med. necessity/level of care/duration of stay  If discussed at [place] of Stay Meetings, dates discussed:   06/19/2014    Comments:  06/19/14- 1400- Marvetta Gibbons RN, [name] [phone] Pt for possible d/c today has been started on Brilinta- spoke with pt and wife at bedside- 55 free card for Brilinta given to pt- per pt he uses [facility] in [place] but many mail order- pt will need 90 day script for mail order along with 30 day for free card. per insurance benefit check pt does have benefit coverage for Brilinta with no pre-auth needed. copay is $9. pt given this info and [facility] [place] does have dose needed in stock. Stent card from 8/1 also found in shadow chart and given to pt. Additional copy of admission Medicare IM given to pt.    06/16/2014 Additional Medicare IM copy given to patient. Jonnie Finner RN CCM Case Mgmt phone [phone]

## 2014-06-09 NOTE — Progress Notes (Signed)
ANTICOAGULATION CONSULT NOTE -follow up  Pharmacy Consult for Heparin  Indication: chest pain/ACS, afib  No Known Allergies  Patient Measurements: Height: 5\' 9"  (175.3 cm) Weight: 191 lb 3.2 oz (86.728 kg) IBW/kg (Calculated) : 70.7  Vital Signs: Temp: 98 F (36.7 C) (07/27 1333) Temp src: Oral (07/27 1333) BP: 123/55 mmHg (07/27 1333) Pulse Rate: 63 (07/27 1333)  Labs:  Recent Labs  06/08/14 1834 06/09/14 0055 06/09/14 0445 06/09/14 1200 06/09/14 1203 06/09/14 1819  HGB 10.3* 11.0*  --   --   --   --   HCT 32.2* 35.1*  --   --   --   --   PLT 186 197  --   --   --   --   LABPROT 23.0* 21.5*  --  22.2*  --   --   INR 2.04* 1.87*  --  1.95*  --   --   HEPARINUNFRC  --   --   --  0.49  --  0.57  CREATININE 1.61* 1.60*  --   --   --   --   TROPONINI  --  <0.30 <0.30  --  <0.30  --     Estimated Creatinine Clearance: 40.2 ml/min (by C-G formula based on Cr of 1.6).   Medical History: Past Medical History  Diagnosis Date  . GERD (gastroesophageal reflux disease)   . Anemia     Secondary to acute blood loss  . Hypertension   . Hyperlipidemia   . Diabetes mellitus     Type II  . Diabetic peripheral neuropathy   . RBBB (right bundle branch block)   . Macular degeneration   . Sleep apnea   . Carotid artery disease     Doppler 09/18/2009 - 49% bilateral stenoses  . Personal history of colonic polyps   . Diverticulosis of colon with hemorrhage   . Erectile dysfunction     Mild  . PNA (pneumonia) 9/11    NSTEMI at [facility] with repeat cath, rec medical mgmt   . Atrial flutter 07/2010    September, 2011   Hospital with PNA and cath done.Marland KitchenMarland KitchenCoumadin.  Atrial flutter ablation planned, but  pt. then had atrial fibrillation,/outpatient conversion 09/08/10..NSR..plan to follow..Dr. Caryl Comes  . GI bleeding     Off aspirin because of prior GI bleed, Severe, 2009, multiple units of blood transfused, patient taken off aspirin  . Chronic kidney disease     ckd #3  . Gout   . Skin  cancer     R lower leg, per derm 2012  . Atrial fibrillation     Consideration was given for atrial flutter ablation, but patient developed atrial fibrillation. Cardioversion was done. Dr. Caryl Comes decided to watch him clinically. November, 2011  . Carotid artery disease     49% bilateral, Doppler, November, 2010  . CAD (coronary artery disease)     Catheterization, September, 2011,  grafts patent from redo CABG,, medical therapy of coronary disease, consideration to proceeding with atrial flutter ablation  . Hx of CABG     CABG in 1995  //   redo CABG 2006  . Ejection fraction     EF 60%, echo, 2009  //   EF 65%, echo, September, 2011  . Mitral regurgitation     Mild, echo, September, 2011  . Shoulder pain     Decreasing Crestor did not affect the pain    Medications:  Warfarin PTA  Assessment: 80 y/o M on IV heparin drip for  CP . Prior to admission he was on coumadin which is currently on hold. Today the INR is 1.95. Heparin level is therapeutic again tonight.   Goal of Therapy:  Heparin level 0.3-0.7 units/ml Monitor platelets by anticoagulation protocol: Yes   Plan:   Continue IV heparin 1100 units/h Daily heparin level and CBC

## 2014-06-09 NOTE — Progress Notes (Signed)
At 0500, during lab draw, pt c/o chest pain, "sharp-pressure" in center of chest, non-radiating, 3/10. Pt denied shortness of breath. BP 143/58, HR 59. EKG done. SL NTG X1 given with complete resolution of chest discomfort within 5 minutes. Pt states "This is the best that I've felt since I came to the hospital." At 5 minutes, BP 125/61, HR 60. Painfree EKG done, in chart.

## 2014-06-09 NOTE — Progress Notes (Addendum)
ANTICOAGULATION CONSULT NOTE - Initial Consult  Pharmacy Consult for Heparin  Indication: chest pain/ACS, afib  No Known Allergies  Patient Measurements: Height: 5\' 9"  (175.3 cm) Weight: 191 lb 3.2 oz (86.728 kg) IBW/kg (Calculated) : 70.7  Vital Signs: Temp: 98 F (36.7 C) (07/26 2351) Temp src: Oral (07/26 2351) BP: 151/65 mmHg (07/26 2351) Pulse Rate: 58 (07/26 2351)  Labs:  Recent Labs  06/08/14 1834  HGB 10.3*  HCT 32.2*  PLT 186  LABPROT 23.0*  INR 2.04*  CREATININE 1.61*    Estimated Creatinine Clearance: 39.9 ml/min (by C-G formula based on Cr of 1.61).   Medical History: Past Medical History  Diagnosis Date  . GERD (gastroesophageal reflux disease)   . Anemia     Secondary to acute blood loss  . Hypertension   . Hyperlipidemia   . Diabetes mellitus     Type II  . Diabetic peripheral neuropathy   . RBBB (right bundle branch block)   . Macular degeneration   . Sleep apnea   . Carotid artery disease     Doppler 09/18/2009 - 49% bilateral stenoses  . Personal history of colonic polyps   . Diverticulosis of colon with hemorrhage   . Erectile dysfunction     Mild  . PNA (pneumonia) 9/11    NSTEMI at [facility] with repeat cath, rec medical mgmt   . Atrial flutter 07/2010    September, 2011   Hospital with PNA and cath done.Marland KitchenMarland KitchenCoumadin.  Atrial flutter ablation planned, but  pt. then had atrial fibrillation,/outpatient conversion 09/08/10..NSR..plan to follow..Dr. Caryl Comes  . GI bleeding     Off aspirin because of prior GI bleed, Severe, 2009, multiple units of blood transfused, patient taken off aspirin  . Chronic kidney disease     ckd #3  . Gout   . Skin cancer     R lower leg, per derm 2012  . Atrial fibrillation     Consideration was given for atrial flutter ablation, but patient developed atrial fibrillation. Cardioversion was done. Dr. Caryl Comes decided to watch him clinically. November, 2011  . Carotid artery disease     49% bilateral, Doppler, November,  2010  . CAD (coronary artery disease)     Catheterization, September, 2011,  grafts patent from redo CABG,, medical therapy of coronary disease, consideration to proceeding with atrial flutter ablation  . Hx of CABG     CABG in 1995  //   redo CABG 2006  . Ejection fraction     EF 60%, echo, 2009  //   EF 65%, echo, September, 2011  . Mitral regurgitation     Mild, echo, September, 2011  . Shoulder pain     Decreasing Crestor did not affect the pain    Medications:  Warfarin PTA  Assessment: 80 y/o M to start heparin for CP when INR <2. INR is 2.04. Hgb 10.3, Scr 1.61, other labs as above.   Goal of Therapy:  Heparin level 0.3-0.7 units/ml Monitor platelets by anticoagulation protocol: Yes   Plan:  -INR this AM to make sure it's not going up -Start heparin if INR <2  Narda Bonds 06/08/2014,11:59 PM ============================================== Addendum 3:51 AM INR down to 1.87 this AM, will start heparin now -Start heparin infusion at 1100 units/hr -1200 HL -Daily CBC/HL -Monitor for bleeding Sharlynn Oliphant, PharmD

## 2014-06-10 DIAGNOSIS — E785 Hyperlipidemia, unspecified: Secondary | ICD-10-CM

## 2014-06-10 LAB — GLUCOSE, CAPILLARY
GLUCOSE-CAPILLARY: 194 mg/dL — AB (ref 70–99)
GLUCOSE-CAPILLARY: 115 mg/dL — AB (ref 70–99)
Glucose-Capillary: 128 mg/dL — ABNORMAL HIGH (ref 70–99)
Glucose-Capillary: 136 mg/dL — ABNORMAL HIGH (ref 70–99)

## 2014-06-10 LAB — BASIC METABOLIC PANEL
ANION GAP: 11 (ref 5–15)
BUN: 22 mg/dL (ref 6–23)
CO2: 25 meq/L (ref 19–32)
CREATININE: 1.64 mg/dL — AB (ref 0.50–1.35)
Calcium: 8.7 mg/dL (ref 8.4–10.5)
Chloride: 107 mEq/L (ref 96–112)
GFR calc Af Amer: 44 mL/min — ABNORMAL LOW (ref >90)
GFR calc non Af Amer: 38 mL/min — ABNORMAL LOW (ref >90)
Glucose, Bld: 124 mg/dL — ABNORMAL HIGH (ref 70–99)
Potassium: 3.6 mEq/L — ABNORMAL LOW (ref 3.7–5.3)
SODIUM: 143 meq/L (ref 137–147)

## 2014-06-10 LAB — PROTIME-INR
INR: 1.9 — ABNORMAL HIGH (ref 0.00–1.49)
INR: 1.76 — ABNORMAL HIGH (ref 0.00–1.49)
PROTHROMBIN TIME: 21.8 s — AB (ref 11.6–15.2)
PROTHROMBIN TIME: 20.5 s — AB (ref 11.6–15.2)

## 2014-06-10 LAB — CBC
HCT: 30.1 % — ABNORMAL LOW (ref 39.0–52.0)
Hemoglobin: 9.6 g/dL — ABNORMAL LOW (ref 13.0–17.0)
MCH: 28.9 pg (ref 26.0–34.0)
MCHC: 31.9 g/dL (ref 30.0–36.0)
MCV: 90.7 fL (ref 78.0–100.0)
Platelets: 152 10*3/uL (ref 150–400)
RBC: 3.32 MIL/uL — AB (ref 4.22–5.81)
RDW: 14.5 % (ref 11.5–15.5)
WBC: 7.1 10*3/uL (ref 4.0–10.5)

## 2014-06-10 LAB — HEPARIN LEVEL (UNFRACTIONATED): HEPARIN UNFRACTIONATED: 0.68 [IU]/mL (ref 0.30–0.70)

## 2014-06-10 MED ORDER — POTASSIUM CHLORIDE CRYS ER 20 MEQ PO TBCR
40.0000 meq | EXTENDED_RELEASE_TABLET | Freq: Once | ORAL | Status: AC
  Administered 2014-06-10: 40 meq via ORAL
  Filled 2014-06-10: qty 2

## 2014-06-10 MED ORDER — HEPARIN (PORCINE) IN NACL 100-0.45 UNIT/ML-% IJ SOLN
900.0000 [IU]/h | INTRAMUSCULAR | Status: DC
  Administered 2014-06-11: 1050 [IU]/h via INTRAVENOUS
  Filled 2014-06-10 (×2): qty 250

## 2014-06-10 MED ORDER — SODIUM CHLORIDE 0.9 % IV SOLN
INTRAVENOUS | Status: DC
  Administered 2014-06-11: 04:00:00 via INTRAVENOUS

## 2014-06-10 NOTE — Progress Notes (Signed)
     SUBJECTIVE: No chest pain or SOB.   BP 131/72  Pulse 61  Temp(Src) 98.2 F (36.8 C) (Oral)  Resp 18  Ht 5\' 9"  (1.753 m)  Wt 189 lb (85.73 kg)  BMI 27.90 kg/m2  SpO2 97%  Intake/Output Summary (Last 24 hours) at 06/10/14 0717 Last data filed at 06/10/14 0422  Gross per 24 hour  Intake    240 ml  Output   1850 ml  Net  -1610 ml    PHYSICAL EXAM General: Well developed, well nourished, in no acute distress. Alert and oriented x 3.  Psych:  Good affect, responds appropriately Neck: No JVD. No masses noted.  Lungs: Clear bilaterally with no wheezes or rhonci noted.  Heart: RRR with no murmurs noted. Abdomen: Bowel sounds are present. Soft, non-tender.  Extremities: No lower extremity edema.   LABS: Basic Metabolic Panel:  Recent Labs  06/09/14 0055 06/10/14 0519  NA 144 143  K 3.7 3.6*  CL 105 107  CO2 29 25  GLUCOSE 153* 124*  BUN 22 22  CREATININE 1.60* 1.64*  CALCIUM 9.4 8.7   CBC:  Recent Labs  06/09/14 2330 06/10/14 0519  WBC 8.3 7.1  HGB 10.5* 9.6*  HCT 32.7* 30.1*  MCV 91.1 90.7  PLT 168 152   Cardiac Enzymes:  Recent Labs  06/09/14 0055 06/09/14 0445 06/09/14 1203  TROPONINI <0.30 <0.30 <0.30   Fasting Lipid Panel: No results found for this basename: CHOL, HDL, LDLCALC, TRIG, CHOLHDL, LDLDIRECT,  in the last 72 hours  Current Meds: . acetaminophen  1,000 mg Oral Daily  . amLODipine  10 mg Oral QPM  . aspirin EC  81 mg Oral Daily  . atorvastatin  40 mg Oral q1800  . colchicine  0.6 mg Oral Once per day on Mon Wed Fri  . furosemide  80 mg Oral Once per day on Mon Tue Wed Thu Fri  . insulin aspart  0-15 Units Subcutaneous TID WC  . insulin glargine  25 Units Subcutaneous QHS  . metoprolol succinate  50 mg Oral Daily  . multivitamin with minerals  1 tablet Oral Daily  . sodium chloride  3 mL Intravenous Q12H     ASSESSMENT AND PLAN:   1. Unstable angina/CAD: Prior CABG. Pt admitted with progressive chest pain c/w unstable  angina. Cardiac markers are negative. Cardiac cath has been planned for today but will delay with INR of 1.9 today and need for groin access. Plan cath 06/11/14 in the am. Will continue heparin drip today. Continue ASA, statin and beta blocker. NPO at midnight for cardiac cath in am tomorrow if INR <1.7.   2. HTN: BP well controlled. No changes.   3. Hyperlipidemia: Continue statin.   4. Diabetes: Continue home regimen.   5. Anemia: H/H overall stable.   8. Paroxysmal atrial fibrillation: currently in sinus rhythm   9. Stage III chronic kidney disease: Renal function stable this am  10. Hypokalemia: Replace K this am.    [name]  7/28/20157:17 AM

## 2014-06-10 NOTE — Progress Notes (Signed)
ANTICOAGULATION CONSULT NOTE -follow up  Pharmacy Consult for Heparin  Indication: chest pain/ACS, afib  No Known Allergies  Patient Measurements: Height: 5\' 9"  (175.3 cm) Weight: 189 lb (85.73 kg) IBW/kg (Calculated) : 70.7  Vital Signs: Temp: 98.2 F (36.8 C) (07/28 0420) Temp src: Oral (07/28 0420) BP: 131/72 mmHg (07/28 0420) Pulse Rate: 61 (07/28 0420)  Labs:  Recent Labs  06/08/14 1834 06/09/14 0055 06/09/14 0445 06/09/14 1200 06/09/14 1203 06/09/14 1819 06/09/14 2330 06/10/14 0519 06/10/14 0820  HGB 10.3* 11.0*  --   --   --   --  10.5* 9.6*  --   HCT 32.2* 35.1*  --   --   --   --  32.7* 30.1*  --   PLT 186 197  --   --   --   --  168 152  --   LABPROT 23.0* 21.5*  --  22.2*  --   --   --  21.8* 20.5*  INR 2.04* 1.87*  --  1.95*  --   --   --  1.90* 1.76*  HEPARINUNFRC  --   --   --  0.49  --  0.57  --  0.68  --   CREATININE 1.61* 1.60*  --   --   --   --   --  1.64*  --   TROPONINI  --  <0.30 <0.30  --  <0.30  --   --   --   --     Estimated Creatinine Clearance: 39 ml/min (by C-G formula based on Cr of 1.64).  Medications:  Warfarin PTA  Assessment: 80 y/o M on IV heparin drip for CP. Prior to admission he was on warfarin which is currently on hold in preparation to go to cath. Today the INR is 1.9, a recheck was 1.76. Heparin level is therapeutic this morning at 0.68units/mL- has increased and is at top end of range. Hgb dropped slightly, platelets remain wnl. No overt bleeding noted.  Goal of Therapy:  Heparin level 0.3-0.7 units/ml Monitor platelets by anticoagulation protocol: Yes   Plan:  1. Reduce heparin slightly to 1050 units/hr so heparin level does not become elevated 2. Daily heparin level and CBC 3. Follow for s/s bleeding 4. F/u after cath (planning for tomorrow morning)   Sayed Apostol D. Dennie Moltz, PharmD, BCPS Clinical Pharmacist Pager: [phone] 06/10/2014 11:23 AM

## 2014-06-11 ENCOUNTER — Encounter (HOSPITAL_COMMUNITY)
Admission: EM | Disposition: A | Discharge status: Home / Self Care | Payer: Self-pay | Source: Home / Self Care | Attending: Cardiology

## 2014-06-11 HISTORY — PX: LEFT HEART CATHETERIZATION WITH CORONARY/GRAFT ANGIOGRAM: SHX5450

## 2014-06-11 LAB — CBC
HCT: 30.9 % — ABNORMAL LOW (ref 39.0–52.0)
HEMATOCRIT: 31.1 % — AB (ref 39.0–52.0)
HEMOGLOBIN: 10 g/dL — AB (ref 13.0–17.0)
Hemoglobin: 9.9 g/dL — ABNORMAL LOW (ref 13.0–17.0)
MCH: 29.2 pg (ref 26.0–34.0)
MCH: 29.2 pg (ref 26.0–34.0)
MCHC: 32.2 g/dL (ref 30.0–36.0)
MCHC: 32 g/dL (ref 30.0–36.0)
MCV: 90.7 fL (ref 78.0–100.0)
MCV: 91.2 fL (ref 78.0–100.0)
Platelets: 165 10*3/uL (ref 150–400)
Platelets: 169 10*3/uL (ref 150–400)
RBC: 3.43 MIL/uL — ABNORMAL LOW (ref 4.22–5.81)
RBC: 3.39 MIL/uL — ABNORMAL LOW (ref 4.22–5.81)
RDW: 14.4 % (ref 11.5–15.5)
RDW: 14.5 % (ref 11.5–15.5)
WBC: 7.8 10*3/uL (ref 4.0–10.5)
WBC: 7.8 10*3/uL (ref 4.0–10.5)

## 2014-06-11 LAB — PROTIME-INR
INR: 1.47 (ref 0.00–1.49)
Prothrombin Time: 17.8 seconds — ABNORMAL HIGH (ref 11.6–15.2)

## 2014-06-11 LAB — GLUCOSE, CAPILLARY
Glucose-Capillary: 129 mg/dL — ABNORMAL HIGH (ref 70–99)
Glucose-Capillary: 124 mg/dL — ABNORMAL HIGH (ref 70–99)
Glucose-Capillary: 88 mg/dL (ref 70–99)
Glucose-Capillary: 132 mg/dL — ABNORMAL HIGH (ref 70–99)

## 2014-06-11 LAB — BASIC METABOLIC PANEL
ANION GAP: 15 (ref 5–15)
BUN: 22 mg/dL (ref 6–23)
CALCIUM: 9.1 mg/dL (ref 8.4–10.5)
CHLORIDE: 103 meq/L (ref 96–112)
CO2: 25 mEq/L (ref 19–32)
CREATININE: 1.75 mg/dL — AB (ref 0.50–1.35)
GFR, EST AFRICAN AMERICAN: 41 mL/min — AB (ref >90)
GFR, EST NON AFRICAN AMERICAN: 35 mL/min — AB (ref >90)
Glucose, Bld: 111 mg/dL — ABNORMAL HIGH (ref 70–99)
Potassium: 3.7 mEq/L (ref 3.7–5.3)
Sodium: 143 mEq/L (ref 137–147)

## 2014-06-11 LAB — HEPARIN LEVEL (UNFRACTIONATED): Heparin Unfractionated: 0.75 IU/mL — ABNORMAL HIGH (ref 0.30–0.70)

## 2014-06-11 SURGERY — LEFT HEART CATHETERIZATION WITH CORONARY/GRAFT ANGIOGRAM
Anesthesia: LOCAL

## 2014-06-11 MED ORDER — VERAPAMIL HCL 2.5 MG/ML IV SOLN
INTRAVENOUS | Status: AC
  Filled 2014-06-11: qty 2

## 2014-06-11 MED ORDER — CLOPIDOGREL BISULFATE 300 MG PO TABS
600.0000 mg | ORAL_TABLET | Freq: Once | ORAL | Status: AC
  Administered 2014-06-11: 600 mg via ORAL
  Filled 2014-06-11: qty 2

## 2014-06-11 MED ORDER — SODIUM CHLORIDE 0.9 % IJ SOLN: 3.0000 mL | Freq: Two times a day (BID) | INTRAMUSCULAR | Status: DC

## 2014-06-11 MED ORDER — HEPARIN (PORCINE) IN NACL 2-0.9 UNIT/ML-% IJ SOLN
INTRAMUSCULAR | Status: AC
  Filled 2014-06-11: qty 1000

## 2014-06-11 MED ORDER — POTASSIUM CHLORIDE CRYS ER 20 MEQ PO TBCR
40.0000 meq | EXTENDED_RELEASE_TABLET | Freq: Every day | ORAL | Status: DC
  Administered 2014-06-11: 40 meq via ORAL
  Filled 2014-06-11 (×2): qty 2

## 2014-06-11 MED ORDER — MIDAZOLAM HCL 2 MG/2ML IJ SOLN
INTRAMUSCULAR | Status: AC
  Filled 2014-06-11: qty 2

## 2014-06-11 MED ORDER — SODIUM CHLORIDE 0.9 % IV SOLN: 250.0000 mL | INTRAVENOUS | Status: DC | PRN

## 2014-06-11 MED ORDER — SODIUM CHLORIDE 0.9 % IJ SOLN: 3.0000 mL | INTRAMUSCULAR | Status: DC | PRN

## 2014-06-11 MED ORDER — ACETAMINOPHEN 325 MG PO TABS: 650.0000 mg | ORAL_TABLET | ORAL | Status: DC | PRN

## 2014-06-11 MED ORDER — CLOPIDOGREL BISULFATE 75 MG PO TABS
75.0000 mg | ORAL_TABLET | Freq: Every day | ORAL | Status: DC
  Administered 2014-06-14: 09:00:00 75 mg via ORAL
  Filled 2014-06-11 (×2): qty 1

## 2014-06-11 MED ORDER — FENTANYL CITRATE 0.05 MG/ML IJ SOLN
INTRAMUSCULAR | Status: AC
  Filled 2014-06-11: qty 2

## 2014-06-11 MED ORDER — HEPARIN SODIUM (PORCINE) 1000 UNIT/ML IJ SOLN
INTRAMUSCULAR | Status: AC
  Filled 2014-06-11: qty 1

## 2014-06-11 MED ORDER — LIDOCAINE HCL (PF) 1 % IJ SOLN
INTRAMUSCULAR | Status: AC
  Filled 2014-06-11: qty 30

## 2014-06-11 MED ORDER — SODIUM CHLORIDE 0.9 % IV SOLN
INTRAVENOUS | Status: DC
  Administered 2014-06-11: 17:00:00 via INTRAVENOUS

## 2014-06-11 MED ORDER — HEPARIN (PORCINE) IN NACL 100-0.45 UNIT/ML-% IJ SOLN
1000.0000 [IU]/h | INTRAMUSCULAR | Status: DC
  Administered 2014-06-12: 900 [IU]/h via INTRAVENOUS
  Administered 2014-06-12 (×2): 1000 [IU]/h via INTRAVENOUS
  Filled 2014-06-11 (×3): qty 250

## 2014-06-11 MED ORDER — NITROGLYCERIN 1 MG/10 ML FOR IR/CATH LAB
INTRA_ARTERIAL | Status: AC
  Filled 2014-06-11: qty 10

## 2014-06-11 NOTE — Progress Notes (Signed)
     SUBJECTIVE: No chest pain or SOB  BP 134/67  Pulse 60  Temp(Src) 98.1 F (36.7 C) (Oral)  Resp 18  Ht 5\' 9"  (1.753 m)  Wt 187 lb 11.2 oz (85.14 kg)  BMI 27.71 kg/m2  SpO2 99%  Intake/Output Summary (Last 24 hours) at 06/11/14 [id number] Last data filed at 06/11/14 [id number]  Gross per 24 hour  Intake    600 ml  Output   1775 ml  Net  -1175 ml    PHYSICAL EXAM General: Well developed, well nourished, in no acute distress. Alert and oriented x 3.  Psych:  Good affect, responds appropriately Neck: No JVD. No masses noted.  Lungs: Clear bilaterally with no wheezes or rhonci noted.  Heart: RRR with no murmurs noted. Abdomen: Bowel sounds are present. Soft, non-tender.  Extremities: No lower extremity edema.   LABS: Basic Metabolic Panel:  Recent Labs  06/10/14 0519 06/11/14 0345  NA 143 143  K 3.6* 3.7  CL 107 103  CO2 25 25  GLUCOSE 124* 111*  BUN 22 22  CREATININE 1.64* 1.75*  CALCIUM 8.7 9.1   CBC:  Recent Labs  06/11/14 0010 06/11/14 0345  WBC 7.8 7.8  HGB 10.0* 9.9*  HCT 31.1* 30.9*  MCV 90.7 91.2  PLT 165 169   Cardiac Enzymes:  Recent Labs  06/09/14 0055 06/09/14 0445 06/09/14 1203  TROPONINI <0.30 <0.30 <0.30   Current Meds: . acetaminophen  1,000 mg Oral Daily  . amLODipine  10 mg Oral QPM  . aspirin EC  81 mg Oral Daily  . atorvastatin  40 mg Oral q1800  . colchicine  0.6 mg Oral Once per day on Mon Wed Fri  . furosemide  80 mg Oral Once per day on Mon Tue Wed Thu Fri  . insulin aspart  0-15 Units Subcutaneous TID WC  . insulin glargine  25 Units Subcutaneous QHS  . metoprolol succinate  50 mg Oral Daily  . multivitamin with minerals  1 tablet Oral Daily  . sodium chloride  3 mL Intravenous Q12H     ASSESSMENT AND PLAN:  1. Unstable angina/CAD: Prior CABG. Pt admitted with progressive chest pain c/w unstable angina. Cardiac markers are negative. Cardiac cath delayed until today due to elevated INR. INR is 1.47. Plan cath today Will  continue heparin drip until cath. Continue ASA, statin and beta blocker.   2. HTN: BP well controlled. No changes.   3. Hyperlipidemia: Continue statin.   4. Diabetes: Continue home regimen.   5. Anemia: H/H overall stable.   8. Paroxysmal atrial fibrillation: currently in sinus rhythm   9. Stage III chronic kidney disease: Renal function stable. Baseline creatinine around 1.8.    10. Hypokalemia: Replace K this am.     [name]  7/29/20156:47 AM

## 2014-06-11 NOTE — Progress Notes (Addendum)
ANTICOAGULATION CONSULT NOTE -follow up  Pharmacy Consult for Heparin  Indication: chest pain/ACS, afib  No Known Allergies  Patient Measurements: Height: 5\' 9"  (175.3 cm) Weight: 187 lb 11.2 oz (85.14 kg) IBW/kg (Calculated) : 70.7  Vital Signs: Temp: 98.1 F (36.7 C) (07/29 0527) Temp src: Oral (07/29 0527) BP: 134/67 mmHg (07/29 0527) Pulse Rate: 60 (07/29 0527)  Labs:  Recent Labs  06/09/14 0055 06/09/14 0445  06/09/14 1203 06/09/14 1819  06/10/14 0519 06/10/14 0820 06/11/14 0010 06/11/14 0345  HGB 11.0*  --   --   --   --   < > 9.6*  --  10.0* 9.9*  HCT 35.1*  --   --   --   --   < > 30.1*  --  31.1* 30.9*  PLT 197  --   --   --   --   < > 152  --  165 169  LABPROT 21.5*  --   < >  --   --   --  21.8* 20.5*  --  17.8*  INR 1.87*  --   < >  --   --   --  1.90* 1.76*  --  1.47  HEPARINUNFRC  --   --   < >  --  0.57  --  0.68  --   --  0.75*  CREATININE 1.60*  --   --   --   --   --  1.64*  --   --  1.75*  TROPONINI <0.30 <0.30  --  <0.30  --   --   --   --   --   --   < > = values in this interval not displayed.  Estimated Creatinine Clearance: 36.4 ml/min (by C-G formula based on Cr of 1.75).  Medications.  Heparin infusing at 1050/hr  Assessment: 80 y/o M on IV heparin drip for CP. Prior to admission he was on warfarin which is currently on hold in preparation to go to cath today at 1300. Heparin is above goal at 0.75 IU/ml. Will decrease to 900/hr and f/u post cath.  Goal of Therapy:  Heparin level 0.3-0.7 units/ml Monitor platelets by anticoagulation protocol: Yes   Plan:  Decrease heparin to 900 units/hr  F/u post cath.     Curlene Dolphin

## 2014-06-11 NOTE — CV Procedure (Addendum)
     Left Heart Catheterization with Coronary and Bypass Graft Angiography Report  [name]  80 y.o.  male [birth year]  Procedure Date: 06/11/2014 Referring Physician: Darlina Guys, M.D. Primary Cardiologist: Dola Argyle, M.D.  INDICATIONS: Unstable angina  PROCEDURE: 1. Left heart catheterization; 2. Coronary angiography; 3. Left ventriculography; 4. Bypass graft angiography including left internal mammary  CONSENT:  The risks, benefits, and details of the procedure were explained in detail to the patient. Risks including death, stroke, heart attack, kidney injury, allergy, limb ischemia, bleeding and radiation injury were discussed.  The patient verbalized understanding and wanted to proceed.  Informed written consent was obtained.  PROCEDURE TECHNIQUE:  After Xylocaine anesthesia a 5 French Slender sheath was placed in the left radial artery with an angiocath and the modified Seldinger technique.  Coronary angiography was done using a 5 F JR 4, JL 3.5, AL .75, and A.L1 catheters.  Left ventriculography was done using the JR 4 catheter and hand injection.   Digital images were reviewed. Adequate guide position could not be obtained from the left radial. Given renal impairment we've decided to bring the patient back in 48 hours after hydration and perform the procedure from the right radial fall right femoral approach.   CONTRAST:  Total of 110 cc.  COMPLICATIONS:  None   HEMODYNAMICS:  Aortic pressure 123/53 mmHg; LV pressure 124/3 mmHg; LVEDP 20 mm mercury  ANGIOGRAPHIC DATA:   The left main coronary artery is patent.  The left anterior descending artery is totally occluded.  The left circumflex artery is is patent but contains proximal 50-70% narrowing. The first marginal contains ostial 60% narrowing. The circumflex territory is relatively small.  The right coronary artery is totally occluded in the mid vessel.  BYPASS GRAFT ANGIOGRAPHY: SVG to RCA, 50% narrowed  proximally but otherwise widely patent. The distal vessel the on the graft insertion site is diffusely diseased both antegrade and retrograde and first left ventricular branches is subtotally occluded in the mid vessel.  SVG to OM1 contains segmental, proximal 95% stenosis with haziness suggesting thrombus. Obtuse marginal branches are small.  LIMA to LAD widely patent. Selective engagement was difficult but there is no significant obstruction noted. The native LAD is diffusely diseased.  LEFT VENTRICULOGRAM:  Left ventricular angiogram was done in the 30 RAO projection and revealed mild mid anterior hypokinesis with an EF of 55%   IMPRESSIONS:  1. 95% stenosis in that degenerated saphenous vein graft to the obtuse marginal. 2. Patent saphenous vein graft to the right coronary. This graft has a 50% proximal stenosis. 3. The LAD is widely patent. 4. Native LAD is totally occluded, obtuse marginal is totally occluded, the proximal native circumflex contains 50-70% stenosis, and the mid RCA is totally occluded 5. LV function is globally normal with mild anterior hypokinesis   RECOMMENDATION:  IV hydration over the next 48 hours Loaded with oral Plavix. Plan PCI of the degenerated saphenous vein graft on Friday morning assuming renal function is stable. Procedure to be performed either from the right radial and femoral approach since left radial guide support was not stable.[name]

## 2014-06-11 NOTE — H&P (View-Only) (Signed)
     SUBJECTIVE: No chest pain or SOB  BP 134/67  Pulse 60  Temp(Src) 98.1 F (36.7 C) (Oral)  Resp 18  Ht 5\' 9"  (1.753 m)  Wt 187 lb 11.2 oz (85.14 kg)  BMI 27.71 kg/m2  SpO2 99%  Intake/Output Summary (Last 24 hours) at 06/11/14 [id number] Last data filed at 06/11/14 [id number]  Gross per 24 hour  Intake    600 ml  Output   1775 ml  Net  -1175 ml    PHYSICAL EXAM General: Well developed, well nourished, in no acute distress. Alert and oriented x 3.  Psych:  Good affect, responds appropriately Neck: No JVD. No masses noted.  Lungs: Clear bilaterally with no wheezes or rhonci noted.  Heart: RRR with no murmurs noted. Abdomen: Bowel sounds are present. Soft, non-tender.  Extremities: No lower extremity edema.   LABS: Basic Metabolic Panel:  Recent Labs  06/10/14 0519 06/11/14 0345  NA 143 143  K 3.6* 3.7  CL 107 103  CO2 25 25  GLUCOSE 124* 111*  BUN 22 22  CREATININE 1.64* 1.75*  CALCIUM 8.7 9.1   CBC:  Recent Labs  06/11/14 0010 06/11/14 0345  WBC 7.8 7.8  HGB 10.0* 9.9*  HCT 31.1* 30.9*  MCV 90.7 91.2  PLT 165 169   Cardiac Enzymes:  Recent Labs  06/09/14 0055 06/09/14 0445 06/09/14 1203  TROPONINI <0.30 <0.30 <0.30   Current Meds: . acetaminophen  1,000 mg Oral Daily  . amLODipine  10 mg Oral QPM  . aspirin EC  81 mg Oral Daily  . atorvastatin  40 mg Oral q1800  . colchicine  0.6 mg Oral Once per day on Mon Wed Fri  . furosemide  80 mg Oral Once per day on Mon Tue Wed Thu Fri  . insulin aspart  0-15 Units Subcutaneous TID WC  . insulin glargine  25 Units Subcutaneous QHS  . metoprolol succinate  50 mg Oral Daily  . multivitamin with minerals  1 tablet Oral Daily  . sodium chloride  3 mL Intravenous Q12H     ASSESSMENT AND PLAN:  1. Unstable angina/CAD: Prior CABG. Pt admitted with progressive chest pain c/w unstable angina. Cardiac markers are negative. Cardiac cath delayed until today due to elevated INR. INR is 1.47. Plan cath today Will  continue heparin drip until cath. Continue ASA, statin and beta blocker.   2. HTN: BP well controlled. No changes.   3. Hyperlipidemia: Continue statin.   4. Diabetes: Continue home regimen.   5. Anemia: H/H overall stable.   8. Paroxysmal atrial fibrillation: currently in sinus rhythm   9. Stage III chronic kidney disease: Renal function stable. Baseline creatinine around 1.8.    10. Hypokalemia: Replace K this am.     [name]  7/29/20156:47 AM

## 2014-06-11 NOTE — Interval H&P Note (Signed)
Cath Lab Visit (complete for each Cath Lab visit)  Clinical Evaluation Leading to the Procedure:   ACS: Yes.    Non-ACS:    Anginal Classification: CCS III  Anti-ischemic medical therapy: Maximal Therapy (2 or more classes of medications)  Non-Invasive Test Results: No non-invasive testing performed  Prior CABG: Previous CABG      History and Physical Interval Note:  06/11/2014 2:56 PM  [name]  has presented today for surgery, with the diagnosis of cp  The various methods of treatment have been discussed with the patient and family. After consideration of risks, benefits and other options for treatment, the patient has consented to  Procedure(s): LEFT HEART CATHETERIZATION WITH CORONARY/GRAFT ANGIOGRAM (N/A) as a surgical intervention .  The patient's history has been reviewed, patient examined, no change in status, stable for surgery.  I have reviewed the patient's chart and labs.  Questions were answered to the patient's satisfaction.     Sinclair Grooms

## 2014-06-12 DIAGNOSIS — I25810 Atherosclerosis of coronary artery bypass graft(s) without angina pectoris: Secondary | ICD-10-CM

## 2014-06-12 LAB — CBC
HCT: 29.8 % — ABNORMAL LOW (ref 39.0–52.0)
Hemoglobin: 9.4 g/dL — ABNORMAL LOW (ref 13.0–17.0)
MCH: 28.9 pg (ref 26.0–34.0)
MCHC: 31.5 g/dL (ref 30.0–36.0)
MCV: 91.7 fL (ref 78.0–100.0)
Platelets: 154 10*3/uL (ref 150–400)
RBC: 3.25 MIL/uL — AB (ref 4.22–5.81)
RDW: 14.6 % (ref 11.5–15.5)
WBC: 6.8 10*3/uL (ref 4.0–10.5)

## 2014-06-12 LAB — BASIC METABOLIC PANEL
Anion gap: 13 (ref 5–15)
BUN: 22 mg/dL (ref 6–23)
CO2: 23 mEq/L (ref 19–32)
Calcium: 8.5 mg/dL (ref 8.4–10.5)
Chloride: 107 mEq/L (ref 96–112)
Creatinine, Ser: 1.58 mg/dL — ABNORMAL HIGH (ref 0.50–1.35)
GFR calc Af Amer: 46 mL/min — ABNORMAL LOW (ref >90)
GFR calc non Af Amer: 40 mL/min — ABNORMAL LOW (ref >90)
Glucose, Bld: 78 mg/dL (ref 70–99)
POTASSIUM: 3.9 meq/L (ref 3.7–5.3)
SODIUM: 143 meq/L (ref 137–147)

## 2014-06-12 LAB — PROTIME-INR
INR: 1.26 (ref 0.00–1.49)
PROTHROMBIN TIME: 15.8 s — AB (ref 11.6–15.2)

## 2014-06-12 LAB — HEPARIN LEVEL (UNFRACTIONATED)
HEPARIN UNFRACTIONATED: 0.34 [IU]/mL (ref 0.30–0.70)
Heparin Unfractionated: 0.17 IU/mL — ABNORMAL LOW (ref 0.30–0.70)

## 2014-06-12 LAB — GLUCOSE, CAPILLARY
GLUCOSE-CAPILLARY: 86 mg/dL (ref 70–99)
GLUCOSE-CAPILLARY: 107 mg/dL — AB (ref 70–99)
GLUCOSE-CAPILLARY: 126 mg/dL — AB (ref 70–99)
Glucose-Capillary: 123 mg/dL — ABNORMAL HIGH (ref 70–99)

## 2014-06-12 MED ORDER — ASPIRIN 81 MG PO CHEW
81.0000 mg | CHEWABLE_TABLET | ORAL | Status: AC
  Administered 2014-06-13: 81 mg via ORAL
  Filled 2014-06-12: qty 1

## 2014-06-12 MED ORDER — SODIUM CHLORIDE 0.9 % IV SOLN
250.0000 mL | INTRAVENOUS | Status: DC | PRN
Start: 2014-06-12 — End: 2014-06-13

## 2014-06-12 MED ORDER — SODIUM CHLORIDE 0.9 % IJ SOLN: 3.0000 mL | Freq: Two times a day (BID) | INTRAMUSCULAR | Status: DC

## 2014-06-12 MED ORDER — SODIUM CHLORIDE 0.9 % IV SOLN: INTRAVENOUS | Status: DC

## 2014-06-12 MED ORDER — SODIUM CHLORIDE 0.9 % IJ SOLN
3.0000 mL | INTRAMUSCULAR | Status: DC | PRN
Start: 2014-06-12 — End: 2014-06-13

## 2014-06-12 NOTE — Plan of Care (Signed)
Problem: Consults Goal: Cardiac Cath Patient Education (See Patient Education module for education specifics.)  Outcome: Completed/Met Date Met:  06/12/14 Watched video

## 2014-06-12 NOTE — Progress Notes (Signed)
[name] had an episode of chest pain 4-5/10, felt similar to what brought him into the hospital, according to the [name].  BP 166/68, HR 61.  One SL Nitro given and effective.  EKG unchanged from previous.  Currently, [name] is chest-pain free, BP now 130/64, HR 61.  Instructed the [name] to call me if he has any further chest pain.  Will continue to monitor.  [name]

## 2014-06-12 NOTE — Progress Notes (Addendum)
ANTICOAGULATION CONSULT NOTE  Pharmacy Consult for Heparin  Indication: chest pain/ACS, afib  No Known Allergies  Patient Measurements: Height: 5\' 9"  (175.3 cm) Weight: 191 lb (86.637 kg) IBW/kg (Calculated) : 70.7 Heparin dosing weight: 86kg  Vital Signs: Temp: 98.3 F (36.8 C) (07/30 1500) Temp src: Oral (07/30 1500) BP: 151/70 mmHg (07/30 1729) Pulse Rate: 57 (07/30 1500)  Labs:  Recent Labs  06/10/14 0519 06/10/14 0820 06/11/14 0010 06/11/14 0345 06/12/14 0459 06/12/14 0659 06/12/14 1645  HGB 9.6*  --  10.0* 9.9* 9.4*  --   --   HCT 30.1*  --  31.1* 30.9* 29.8*  --   --   PLT 152  --  165 169 154  --   --   LABPROT 21.8* 20.5*  --  17.8* 15.8*  --   --   INR 1.90* 1.76*  --  1.47 1.26  --   --   HEPARINUNFRC 0.68  --   --  0.75*  --  0.17* 0.34  CREATININE 1.64*  --   --  1.75* 1.58*  --   --     Estimated Creatinine Clearance: 40.7 ml/min (by C-G formula based on Cr of 1.58).  Assessment: 80 y/o M on IV heparin drip for CAD - pt awaiting PCI (separated from cath due to contrast load). Heparin level therapeutic on 1000 units/hr. No bleeding noted.   Goal of Therapy:  Heparin level 0.3-0.7 units/ml Monitor platelets by anticoagulation protocol: Yes   Plan:  1. Continue heparin at 1000 units/hr 2. Daily HL and CBC  Sherlon Handing, PharmD, BCPS Clinical pharmacist, pager [phone] 06/12/2014 6:46 PM

## 2014-06-12 NOTE — Progress Notes (Signed)
ANTICOAGULATION CONSULT NOTE  Pharmacy Consult for Heparin  Indication: chest pain/ACS, afib  No Known Allergies  Patient Measurements: Height: 5\' 9"  (175.3 cm) Weight: 191 lb (86.637 kg) IBW/kg (Calculated) : 70.7 Heparin dosing weight: 86kg  Vital Signs: Temp: 98 F (36.7 C) (07/30 0410) Temp src: Oral (07/30 0410) BP: 129/63 mmHg (07/30 0410) Pulse Rate: 58 (07/30 0410)  Labs:  Recent Labs  06/09/14 1203  06/10/14 0519 06/10/14 0820 06/11/14 0010 06/11/14 0345 06/12/14 0459 06/12/14 0659  HGB  --   < > 9.6*  --  10.0* 9.9* 9.4*  --   HCT  --   < > 30.1*  --  31.1* 30.9* 29.8*  --   PLT  --   < > 152  --  165 169 154  --   LABPROT  --   --  21.8* 20.5*  --  17.8* 15.8*  --   INR  --   --  1.90* 1.76*  --  1.47 1.26  --   HEPARINUNFRC  --   < > 0.68  --   --  0.75*  --  0.17*  CREATININE  --   --  1.64*  --   --  1.75* 1.58*  --   TROPONINI <0.30  --   --   --   --   --   --   --   < > = values in this interval not displayed.  Estimated Creatinine Clearance: 40.7 ml/min (by C-G formula based on Cr of 1.58).  Assessment: 80 y/o M on IV heparin drip for CP. Heparin level was elevated yesterday morning and drip rate was decreased. Patient went to cath yesterday- a PCI is planned for tomorrow in order to separate contrast load. Heparin was resumed post-cath at reduced rate of 900units/hr- a level this morning is low at 0.17 units/mL.  Hgb and platelets dropped slightly. No bleeding noted.  Goal of Therapy:  Heparin level 0.3-0.7 units/ml Monitor platelets by anticoagulation protocol: Yes   Plan:  1. Increase heparin to 1000 units/hr 2. Heparin level in 8 hours 3. Daily HL and CBC 4. Follow for any changes to PCI tomorrow and s/s bleeding   Quantavius Humm D. Khyrie Masi, PharmD, BCPS Clinical Pharmacist Pager: [phone] 06/12/2014 8:43 AM

## 2014-06-12 NOTE — Progress Notes (Signed)
SUBJECTIVE: No chest pain this am. He did have one episode last night, resolved with NTG SL   BP 129/63  Pulse 58  Temp(Src) 98 F (36.7 C) (Oral)  Resp 16  Ht 5\' 9"  (1.753 m)  Wt 191 lb (86.637 kg)  BMI 28.19 kg/m2  SpO2 98%  Intake/Output Summary (Last 24 hours) at 06/12/14 0724 Last data filed at 06/12/14 0300  Gross per 24 hour  Intake    240 ml  Output    600 ml  Net   -360 ml    PHYSICAL EXAM General: Well developed, well nourished, in no acute distress. Alert and oriented x 3.  Psych:  Good affect, responds appropriately Neck: No JVD. No masses noted.  Lungs: Clear bilaterally with no wheezes or rhonci noted.  Heart: RRR with no murmurs noted. Abdomen: Bowel sounds are present. Soft, non-tender.  Extremities: No lower extremity edema.   LABS: Basic Metabolic Panel:  Recent Labs  06/11/14 0345 06/12/14 0459  NA 143 143  K 3.7 3.9  CL 103 107  CO2 25 23  GLUCOSE 111* 78  BUN 22 22  CREATININE 1.75* 1.58*  CALCIUM 9.1 8.5   CBC:  Recent Labs  06/11/14 0345 06/12/14 0459  WBC 7.8 6.8  HGB 9.9* 9.4*  HCT 30.9* 29.8*  MCV 91.2 91.7  PLT 169 154   Cardiac Enzymes:  Recent Labs  06/09/14 1203  TROPONINI <0.30   Current Meds: . acetaminophen  1,000 mg Oral Daily  . amLODipine  10 mg Oral QPM  . aspirin EC  81 mg Oral Daily  . atorvastatin  40 mg Oral q1800  . clopidogrel  75 mg Oral Daily  . colchicine  0.6 mg Oral Once per day on Mon Wed Fri  . furosemide  80 mg Oral Once per day on Mon Tue Wed Thu Fri  . insulin aspart  0-15 Units Subcutaneous TID WC  . insulin glargine  25 Units Subcutaneous QHS  . metoprolol succinate  50 mg Oral Daily  . multivitamin with minerals  1 tablet Oral Daily  . potassium chloride  40 mEq Oral Daily  . sodium chloride  3 mL Intravenous Q12H   Cardiac cath 06/11/14: The left main coronary artery is patent.  The left anterior descending artery is totally occluded.  The left circumflex artery is is  patent but contains proximal 50-70% narrowing. The first marginal contains ostial 60% narrowing. The circumflex territory is relatively small.  The right coronary artery is totally occluded in the mid vessel.  BYPASS GRAFT ANGIOGRAPHY: SVG to RCA, 50% narrowed proximally but otherwise widely patent. The distal vessel the on the graft insertion site is diffusely diseased both antegrade and retrograde and first left ventricular branches is subtotally occluded in the mid vessel.  SVG to OM1 contains segmental, proximal 95% stenosis with haziness suggesting thrombus. Obtuse marginal branches are small.  LIMA to LAD widely patent. Selective engagement was difficult but there is no significant obstruction noted. The native LAD is diffusely diseased.  LEFT VENTRICULOGRAM: Left ventricular angiogram was done in the 30 RAO projection and revealed mild mid anterior hypokinesis with an EF of 55%  ASSESSMENT AND PLAN:  1. Unstable angina/CAD: Prior CABG. Pt admitted with progressive chest pain c/w unstable angina. Cardiac markers negative. Cardiac cath 06/11/14 with patent SVG to RCA, patent LIMA to LAD but high grade disease in SVG to OM1. Due to contrast load, PCI delayed. Per [name], plans for PCI on  SVG to OM tomorrow. Will hydrate gently today. Pt has been loaded with Plavix. Continue ASA, statin and beta blocker. Continue heparin drip. NPO at midnight for PCI tomorrow.   2. HTN: BP well controlled. No changes.   3. Hyperlipidemia: Continue statin.   4. Diabetes: Continue home regimen.   5. Anemia: H/H stable.   8. Paroxysmal atrial fibrillation: currently in sinus rhythm   9. Stage III chronic kidney disease: Renal function stable. Baseline creatinine around 1.8.   10. Hypokalemia: Resolved with supplementation.     [name],CHRISTOPHER  7/30/20157:24 AM

## 2014-06-13 ENCOUNTER — Encounter (HOSPITAL_COMMUNITY)
Admission: EM | Disposition: A | Discharge status: Home / Self Care | Payer: Medicare Other | Source: Home / Self Care | Attending: Cardiology

## 2014-06-13 ENCOUNTER — Encounter (HOSPITAL_COMMUNITY)
Admission: EM | Disposition: A | Discharge status: Home / Self Care | Payer: Self-pay | Source: Home / Self Care | Attending: Cardiology

## 2014-06-13 HISTORY — PX: PERCUTANEOUS CORONARY STENT INTERVENTION (PCI-S): SHX5485

## 2014-06-13 LAB — GLUCOSE, CAPILLARY
GLUCOSE-CAPILLARY: 95 mg/dL (ref 70–99)
Glucose-Capillary: 97 mg/dL (ref 70–99)
Glucose-Capillary: 89 mg/dL (ref 70–99)
Glucose-Capillary: 151 mg/dL — ABNORMAL HIGH (ref 70–99)

## 2014-06-13 LAB — BASIC METABOLIC PANEL
ANION GAP: 8 (ref 5–15)
BUN: 18 mg/dL (ref 6–23)
CHLORIDE: 112 meq/L (ref 96–112)
CO2: 24 mEq/L (ref 19–32)
CREATININE: 1.5 mg/dL — AB (ref 0.50–1.35)
Calcium: 8.8 mg/dL (ref 8.4–10.5)
GFR calc non Af Amer: 42 mL/min — ABNORMAL LOW (ref >90)
GFR, EST AFRICAN AMERICAN: 49 mL/min — AB (ref >90)
Glucose, Bld: 102 mg/dL — ABNORMAL HIGH (ref 70–99)
POTASSIUM: 4.2 meq/L (ref 3.7–5.3)
SODIUM: 144 meq/L (ref 137–147)

## 2014-06-13 LAB — CBC
HCT: 29.8 % — ABNORMAL LOW (ref 39.0–52.0)
Hemoglobin: 9.5 g/dL — ABNORMAL LOW (ref 13.0–17.0)
MCH: 29.1 pg (ref 26.0–34.0)
MCHC: 31.9 g/dL (ref 30.0–36.0)
MCV: 91.1 fL (ref 78.0–100.0)
PLATELETS: 151 10*3/uL (ref 150–400)
RBC: 3.27 MIL/uL — AB (ref 4.22–5.81)
RDW: 14.5 % (ref 11.5–15.5)
WBC: 6.8 10*3/uL (ref 4.0–10.5)

## 2014-06-13 LAB — PROTIME-INR
INR: 1.12 (ref 0.00–1.49)
PROTHROMBIN TIME: 14.4 s (ref 11.6–15.2)

## 2014-06-13 LAB — HEPARIN LEVEL (UNFRACTIONATED): HEPARIN UNFRACTIONATED: 0.23 [IU]/mL — AB (ref 0.30–0.70)

## 2014-06-13 SURGERY — PERCUTANEOUS CORONARY STENT INTERVENTION (PCI-S)
Anesthesia: LOCAL

## 2014-06-13 MED ORDER — BIVALIRUDIN 250 MG IV SOLR
INTRAVENOUS | Status: AC
  Filled 2014-06-13: qty 250

## 2014-06-13 MED ORDER — SODIUM CHLORIDE 0.9 % IV SOLN
0.2500 mg/kg/h | INTRAVENOUS | Status: DC
  Administered 2014-06-13: 0.25 mg/kg/h via INTRAVENOUS
  Filled 2014-06-13: qty 250

## 2014-06-13 MED ORDER — FENTANYL CITRATE 0.05 MG/ML IJ SOLN
INTRAMUSCULAR | Status: AC
  Filled 2014-06-13: qty 2

## 2014-06-13 MED ORDER — WARFARIN - PHARMACIST DOSING INPATIENT: Freq: Every day | Status: DC

## 2014-06-13 MED ORDER — CLOPIDOGREL BISULFATE 75 MG PO TABS
ORAL_TABLET | ORAL | Status: AC
  Filled 2014-06-13: qty 1

## 2014-06-13 MED ORDER — WARFARIN SODIUM 5 MG PO TABS
5.0000 mg | ORAL_TABLET | Freq: Once | ORAL | Status: AC
  Administered 2014-06-13: 19:00:00 5 mg via ORAL
  Filled 2014-06-13: qty 1

## 2014-06-13 MED ORDER — MIDAZOLAM HCL 2 MG/2ML IJ SOLN
INTRAMUSCULAR | Status: AC
  Filled 2014-06-13: qty 2

## 2014-06-13 MED ORDER — NITROGLYCERIN 1 MG/10 ML FOR IR/CATH LAB
INTRA_ARTERIAL | Status: AC
  Filled 2014-06-13: qty 10

## 2014-06-13 MED ORDER — SODIUM CHLORIDE 0.9 % IV SOLN: INTRAVENOUS | Status: AC

## 2014-06-13 MED ORDER — ACETAMINOPHEN 325 MG PO TABS
650.0000 mg | ORAL_TABLET | ORAL | Status: DC | PRN
  Administered 2014-06-14: 650 mg via ORAL
  Filled 2014-06-13: qty 2

## 2014-06-13 MED ORDER — LIDOCAINE-EPINEPHRINE 1 %-1:100000 IJ SOLN
INTRAMUSCULAR | Status: AC
  Filled 2014-06-13: qty 1

## 2014-06-13 MED ORDER — VERAPAMIL HCL 2.5 MG/ML IV SOLN
INTRAVENOUS | Status: AC
Start: 2014-06-13 — End: 2014-06-13
  Filled 2014-06-13: qty 2

## 2014-06-13 NOTE — Progress Notes (Signed)
SUBJECTIVE: Several episodes of chest pain overnight  BP 147/57  Pulse 65  Temp(Src) 98 F (36.7 C) (Oral)  Resp 18  Ht 5\' 9"  (1.753 m)  Wt 191 lb (86.637 kg)  BMI 28.19 kg/m2  SpO2 99%  Intake/Output Summary (Last 24 hours) at 06/13/14 0710 Last data filed at 06/13/14 0700  Gross per 24 hour  Intake 2099.5 ml  Output   2800 ml  Net -700.5 ml    PHYSICAL EXAM General: Well developed, well nourished, in no acute distress. Alert and oriented x 3.  Psych:  Good affect, responds appropriately Neck: No JVD. No masses noted.  Lungs: Clear bilaterally with no wheezes or rhonci noted.  Heart: RRR with no murmurs noted. Abdomen: Bowel sounds are present. Soft, non-tender.  Extremities: No lower extremity edema.   LABS: Basic Metabolic Panel:  Recent Labs  06/11/14 0345 06/12/14 0459  NA 143 143  K 3.7 3.9  CL 103 107  CO2 25 23  GLUCOSE 111* 78  BUN 22 22  CREATININE 1.75* 1.58*  CALCIUM 9.1 8.5   CBC:  Recent Labs  06/12/14 0459 06/13/14 0626  WBC 6.8 6.8  HGB 9.4* 9.5*  HCT 29.8* 29.8*  MCV 91.7 91.1  PLT 154 151   Current Meds: . acetaminophen  1,000 mg Oral Daily  . amLODipine  10 mg Oral QPM  . aspirin EC  81 mg Oral Daily  . atorvastatin  40 mg Oral q1800  . clopidogrel  75 mg Oral Daily  . colchicine  0.6 mg Oral Once per day on Mon Wed Fri  . insulin aspart  0-15 Units Subcutaneous TID WC  . insulin glargine  25 Units Subcutaneous QHS  . metoprolol succinate  50 mg Oral Daily  . multivitamin with minerals  1 tablet Oral Daily  . sodium chloride  3 mL Intravenous Q12H   Cardiac cath 06/11/14:  The left main coronary artery is patent.  The left anterior descending artery is totally occluded.  The left circumflex artery is is patent but contains proximal 50-70% narrowing. The first marginal contains ostial 60% narrowing. The circumflex territory is relatively small.  The right coronary artery is totally occluded in the mid vessel.  BYPASS  GRAFT ANGIOGRAPHY: SVG to RCA, 50% narrowed proximally but otherwise widely patent. The distal vessel the on the graft insertion site is diffusely diseased both antegrade and retrograde and first left ventricular branches is subtotally occluded in the mid vessel.  SVG to OM1 contains segmental, proximal 95% stenosis with haziness suggesting thrombus. Obtuse marginal branches are small.  LIMA to LAD widely patent. Selective engagement was difficult but there is no significant obstruction noted. The native LAD is diffusely diseased.  LEFT VENTRICULOGRAM: Left ventricular angiogram was done in the 30 RAO projection and revealed mild mid anterior hypokinesis with an EF of 55%   ASSESSMENT AND PLAN:   1. Unstable angina/CAD: Prior CABG. Pt admitted with progressive chest pain c/w unstable angina. Cardiac markers negative. Cardiac cath 06/11/14 with patent SVG to RCA, patent LIMA to LAD but high grade disease in SVG to OM1. Due to contrast load, PCI delayed. Per [name], plans for PCI on SVG to OM today if renal function is stable. BMET pending at this time. Pt has been loaded with Plavix. Continue ASA, statin and beta blocker. Continue heparin drip.  2. HTN: BP well controlled. No changes.   3. Hyperlipidemia: Continue statin.   4. Diabetes: Continue home regimen.  5. Anemia: H/H stable.   8. Paroxysmal atrial fibrillation: currently in sinus rhythm   9. Stage III chronic kidney disease: Renal function stable. BMET pending this am. Baseline creatinine around 1.8.   10. Hypokalemia: Resolved with supplementation.      [name]  7/31/20157:10 AM

## 2014-06-13 NOTE — Interval H&P Note (Signed)
Cath Lab Visit (complete for each Cath Lab visit)  Clinical Evaluation Leading to the Procedure:   ACS: Yes.    Non-ACS:    Anginal Classification: CCS IV  Anti-ischemic medical therapy: Maximal Therapy (2 or more classes of medications)  Non-Invasive Test Results: No non-invasive testing performed  Prior CABG: Previous CABG      History and Physical Interval Note:  06/13/2014 9:13 AM  [name]  has presented today for surgery, with the diagnosis of cad  The various methods of treatment have been discussed with the patient and family. After consideration of risks, benefits and other options for treatment, the patient has consented to  Procedure(s): PERCUTANEOUS CORONARY STENT INTERVENTION (PCI-S) (N/A) as a surgical intervention .  The patient's history has been reviewed, patient examined, no change in status, stable for surgery.  I have reviewed the patient's chart and labs.  Questions were answered to the patient's satisfaction.     Sinclair Grooms

## 2014-06-13 NOTE — CV Procedure (Deleted)
This report was performed in error.

## 2014-06-13 NOTE — CV Procedure (Addendum)
     PERCUTANEOUS CORONARY INTERVENTION   [name] is a 80 y.o. male  INDICATION: Unstable angina pectoris due to degenerated saphenous vein graft to the obtuse marginal   PROCEDURE: 1. DES to the SVG to circumflex.; 2. Failure of spider distal protection device to cross the lesion   CONSENT: The risks, benefits, and details of the procedure were explained to the patient. Risks including death, MI, stroke, bleeding, limb ischemia, emergency CABG, renal failure and allergy were described and accepted by the patient.  Informed written consent was obtained prior to proceeding.  PROCEDURE TECHNIQUE:  After Xylocaine anesthesia a 6 French sheath was placed in the right femoral artery with a single anterior needle wall stick.   Coronary guiding shots were made using a 6 French AL 2 guide catheter. We started with an AL1 but switched to the 2 to provide better support. Antithrombotic therapy,bivalirudin bolus and infusion, was begun and determined to be therapeutic by ACT. Antiplatelet therapy, Plavix, was loaded 48 hours prior to the procedure and has continued 75 mg daily since that time.  After obtaining guiding shots we were able to place a BMW wire beyond the region of stenosis. We then attempted to pass a 4.0 Spider distal protection device. The sheath device would not cross the stenosis. We removed the device and predilated the stenosis with a 2.0 x 15 mm balloon. We removed the balloon and an attempted to advance a spider into the distal vessel but it was still not cross the lesion. At this point, I decided to proceed without distal protection.  A 4.0 x 24 mm Promus Premier DE was positioned and deployed to 11 atmospheres. Only one inflation was performed. High pressure inflation was not felt necessary. A limited number of inflations was to avoid "squeezing" plaque material from the lesion which would then cause distal embolization. After deployment there was initial slow flow with chest  discomfort that resolved quickly after 100 mcg of intracoronary nitroglycerin and verapamil.  Angio-Seal was performed post procedure for hemostasis. There was some moderate oozing that required lidocaine and epinephrine administration. Hemostasis was eventually resolved and a pressure dressing applied. [name] EQUIPMENT:  As noted above  CONTRAST:  Total of  105 cc.  COMPLICATIONS:  Inability to use distal protection because the device would not cross the stenosis.    ANGIOGRAPHIC RESULTS:   99% mid saphenous vein graft obstruction reduced to 0% with TIMI grade 3 flow using a 4.0 drug-eluting stent. TIMI grade 3 flow at the end of the case was noted and there was good myocardial blush.   IMPRESSIONS:  Successful DES implantation in the saphenous vein graft to the marginal with reduction in stenosis from 99% to 0% with TIMI grade 3 flow   RECOMMENDATION:  Aspirin and Plavix for least a year and likely indefinitely  Patient is eligible for discharge in a.m. assuming no groin, renal or ischemic complication.    Sinclair Grooms, MD 06/13/2014 10:27 AM

## 2014-06-13 NOTE — Progress Notes (Signed)
[place] for Heparin  Indication: chest pain/ACS, afib  No Known Allergies  Patient Measurements: Height: 5\' 9"  (175.3 cm) Weight: 191 lb (86.637 kg) IBW/kg (Calculated) : 70.7 Heparin dosing weight: 86kg  Vital Signs: Temp: 98 F (36.7 C) (07/31 0444) Temp src: Oral (07/31 0444) BP: 147/57 mmHg (07/31 0444) Pulse Rate: 65 (07/31 0444)  Labs:  Recent Labs  06/11/14 0345 06/12/14 0459 06/12/14 0659 06/12/14 1645 06/13/14 0626  HGB 9.9* 9.4*  --   --  9.5*  HCT 30.9* 29.8*  --   --  29.8*  PLT 169 154  --   --  151  LABPROT 17.8* 15.8*  --   --  14.4  INR 1.47 1.26  --   --  1.12  HEPARINUNFRC 0.75*  --  0.17* 0.34 0.23*  CREATININE 1.75* 1.58*  --   --  1.50*    Estimated Creatinine Clearance: 42.8 ml/min (by C-G formula based on Cr of 1.5).  Assessment: 80 y/o M on IV heparin drip for CAD - pt awaiting PCI (separated from cath due to contrast load). Heparin level therapeutic on 1000 units/hr last evening. This morning heparin level dropped slightly, however patient due to go to cath in about an hour. CBC is stable, no bleeding noted.   Goal of Therapy:  Heparin level 0.3-0.7 units/ml Monitor platelets by anticoagulation protocol: Yes   Plan:  1. Continue heparin at 1000 units/hr 2. Follow up post-cath  Char Feltman D. Majesty Oehlert, PharmD, BCPS Clinical Pharmacist Pager: [phone] 06/13/2014 8:02 AM

## 2014-06-13 NOTE — Progress Notes (Signed)
ANTICOAGULATION CONSULT NOTE - Initial Consult  Pharmacy Consult for coumadin Indication: atrial fibrillation  No Known Allergies  Patient Measurements: Height: 5\' 9"  (175.3 cm) Weight: 191 lb (86.637 kg) IBW/kg (Calculated) : 70.7 Heparin Dosing Weight:   Vital Signs: Temp: 97.5 F (36.4 C) (07/31 1147) Temp src: Oral (07/31 1147) BP: 147/57 mmHg (07/31 0444) Pulse Rate: 62 (07/31 0852)  Labs:  Recent Labs  06/11/14 0345 06/12/14 0459 06/12/14 0659 06/12/14 1645 06/13/14 0626  HGB 9.9* 9.4*  --   --  9.5*  HCT 30.9* 29.8*  --   --  29.8*  PLT 169 154  --   --  151  LABPROT 17.8* 15.8*  --   --  14.4  INR 1.47 1.26  --   --  1.12  HEPARINUNFRC 0.75*  --  0.17* 0.34 0.23*  CREATININE 1.75* 1.58*  --   --  1.50*    Estimated Creatinine Clearance: 42.8 ml/min (by C-G formula based on Cr of 1.5).   Medical History: Past Medical History  Diagnosis Date  . GERD (gastroesophageal reflux disease)   . Anemia     Secondary to acute blood loss  . Hypertension   . Hyperlipidemia   . Diabetes mellitus     Type II  . Diabetic peripheral neuropathy   . RBBB (right bundle branch block)   . Macular degeneration   . Sleep apnea   . Carotid artery disease     Doppler 09/18/2009 - 49% bilateral stenoses  . Personal history of colonic polyps   . Diverticulosis of colon with hemorrhage   . Erectile dysfunction     Mild  . PNA (pneumonia) 9/11    NSTEMI at [facility] with repeat cath, rec medical mgmt   . Atrial flutter 07/2010    September, 2011   Hospital with PNA and cath done.Marland KitchenMarland KitchenCoumadin.  Atrial flutter ablation planned, but  pt. then had atrial fibrillation,/outpatient conversion 09/08/10..NSR..plan to follow..Dr. Caryl Comes  . GI bleeding     Off aspirin because of prior GI bleed, Severe, 2009, multiple units of blood transfused, patient taken off aspirin  . Chronic kidney disease     ckd #3  . Gout   . Skin cancer     R lower leg, per derm 2012  . Atrial fibrillation    Consideration was given for atrial flutter ablation, but patient developed atrial fibrillation. Cardioversion was done. Dr. Caryl Comes decided to watch him clinically. November, 2011  . Carotid artery disease     49% bilateral, Doppler, November, 2010  . CAD (coronary artery disease)     Catheterization, September, 2011,  grafts patent from redo CABG,, medical therapy of coronary disease, consideration to proceeding with atrial flutter ablation  . Hx of CABG     CABG in 1995  //   redo CABG 2006  . Ejection fraction     EF 60%, echo, 2009  //   EF 65%, echo, September, 2011  . Mitral regurgitation     Mild, echo, September, 2011  . Shoulder pain     Decreasing Crestor did not affect the pain    Medications:  Scheduled:  . acetaminophen  1,000 mg Oral Daily  . amLODipine  10 mg Oral QPM  . aspirin EC  81 mg Oral Daily  . atorvastatin  40 mg Oral q1800  . clopidogrel  75 mg Oral Daily  . colchicine  0.6 mg Oral Once per day on Mon Wed Fri  . insulin aspart  0-15  Units Subcutaneous TID WC  . insulin glargine  25 Units Subcutaneous QHS  . metoprolol succinate  50 mg Oral Daily  . multivitamin with minerals  1 tablet Oral Daily   Infusions:  . sodium chloride 100 mL/hr at 06/13/14 1130    Assessment: 80 yo male with afib will be resumed on coumadin post cath. Per MD, ok to resume.  INR today is 1.12.  Patient was on coumadin 7.5 mg TTSS and 3.75 mg MWF prior to admission (last dose 06/07/14) and patient's INR was somewhat elevated.  Goal of Therapy:  INR 2-3 Monitor platelets by anticoagulation protocol: Yes   Plan:  - coumadin 5 mg po x1 - INR in am - watch for bleeding  Sherri Levenhagen, Tsz-Yin 06/13/2014,12:13 PM

## 2014-06-13 NOTE — H&P (View-Only) (Signed)
SUBJECTIVE: Several episodes of chest pain overnight  BP 147/57  Pulse 65  Temp(Src) 98 F (36.7 C) (Oral)  Resp 18  Ht 5\' 9"  (1.753 m)  Wt 191 lb (86.637 kg)  BMI 28.19 kg/m2  SpO2 99%  Intake/Output Summary (Last 24 hours) at 06/13/14 0710 Last data filed at 06/13/14 0700  Gross per 24 hour  Intake 2099.5 ml  Output   2800 ml  Net -700.5 ml    PHYSICAL EXAM General: Well developed, well nourished, in no acute distress. Alert and oriented x 3.  Psych:  Good affect, responds appropriately Neck: No JVD. No masses noted.  Lungs: Clear bilaterally with no wheezes or rhonci noted.  Heart: RRR with no murmurs noted. Abdomen: Bowel sounds are present. Soft, non-tender.  Extremities: No lower extremity edema.   LABS: Basic Metabolic Panel:  Recent Labs  06/11/14 0345 06/12/14 0459  NA 143 143  K 3.7 3.9  CL 103 107  CO2 25 23  GLUCOSE 111* 78  BUN 22 22  CREATININE 1.75* 1.58*  CALCIUM 9.1 8.5   CBC:  Recent Labs  06/12/14 0459 06/13/14 0626  WBC 6.8 6.8  HGB 9.4* 9.5*  HCT 29.8* 29.8*  MCV 91.7 91.1  PLT 154 151   Current Meds: . acetaminophen  1,000 mg Oral Daily  . amLODipine  10 mg Oral QPM  . aspirin EC  81 mg Oral Daily  . atorvastatin  40 mg Oral q1800  . clopidogrel  75 mg Oral Daily  . colchicine  0.6 mg Oral Once per day on Mon Wed Fri  . insulin aspart  0-15 Units Subcutaneous TID WC  . insulin glargine  25 Units Subcutaneous QHS  . metoprolol succinate  50 mg Oral Daily  . multivitamin with minerals  1 tablet Oral Daily  . sodium chloride  3 mL Intravenous Q12H   Cardiac cath 06/11/14:  The left main coronary artery is patent.  The left anterior descending artery is totally occluded.  The left circumflex artery is is patent but contains proximal 50-70% narrowing. The first marginal contains ostial 60% narrowing. The circumflex territory is relatively small.  The right coronary artery is totally occluded in the mid vessel.  BYPASS  GRAFT ANGIOGRAPHY: SVG to RCA, 50% narrowed proximally but otherwise widely patent. The distal vessel the on the graft insertion site is diffusely diseased both antegrade and retrograde and first left ventricular branches is subtotally occluded in the mid vessel.  SVG to OM1 contains segmental, proximal 95% stenosis with haziness suggesting thrombus. Obtuse marginal branches are small.  LIMA to LAD widely patent. Selective engagement was difficult but there is no significant obstruction noted. The native LAD is diffusely diseased.  LEFT VENTRICULOGRAM: Left ventricular angiogram was done in the 30 RAO projection and revealed mild mid anterior hypokinesis with an EF of 55%   ASSESSMENT AND PLAN:   1. Unstable angina/CAD: Prior CABG. Pt admitted with progressive chest pain c/w unstable angina. Cardiac markers negative. Cardiac cath 06/11/14 with patent SVG to RCA, patent LIMA to LAD but high grade disease in SVG to OM1. Due to contrast load, PCI delayed. Per [name], plans for PCI on SVG to OM today if renal function is stable. BMET pending at this time. Pt has been loaded with Plavix. Continue ASA, statin and beta blocker. Continue heparin drip.  2. HTN: BP well controlled. No changes.   3. Hyperlipidemia: Continue statin.   4. Diabetes: Continue home regimen.  5. Anemia: H/H stable.   8. Paroxysmal atrial fibrillation: currently in sinus rhythm   9. Stage III chronic kidney disease: Renal function stable. BMET pending this am. Baseline creatinine around 1.8.   10. Hypokalemia: Resolved with supplementation.      [name]  7/31/20157:10 AM

## 2014-06-13 NOTE — Progress Notes (Signed)
Pt had 4 episodes of chest pain during the night that he rated a 4-5 and each episode was relieved with 1 dose of  0.4 ntg SL.

## 2014-06-14 ENCOUNTER — Inpatient Hospital Stay (HOSPITAL_COMMUNITY): Payer: Medicare Other

## 2014-06-14 ENCOUNTER — Encounter (HOSPITAL_COMMUNITY)
Admission: EM | Disposition: A | Discharge status: Home / Self Care | Payer: Medicare Other | Source: Home / Self Care | Attending: Cardiology

## 2014-06-14 DIAGNOSIS — I214 Non-ST elevation (NSTEMI) myocardial infarction: Secondary | ICD-10-CM

## 2014-06-14 DIAGNOSIS — I119 Hypertensive heart disease without heart failure: Secondary | ICD-10-CM

## 2014-06-14 DIAGNOSIS — R7989 Other specified abnormal findings of blood chemistry: Secondary | ICD-10-CM

## 2014-06-14 DIAGNOSIS — Z7901 Long term (current) use of anticoagulants: Secondary | ICD-10-CM

## 2014-06-14 DIAGNOSIS — I059 Rheumatic mitral valve disease, unspecified: Secondary | ICD-10-CM

## 2014-06-14 DIAGNOSIS — I4510 Unspecified right bundle-branch block: Secondary | ICD-10-CM

## 2014-06-14 DIAGNOSIS — Z951 Presence of aortocoronary bypass graft: Secondary | ICD-10-CM

## 2014-06-14 DIAGNOSIS — Z8719 Personal history of other diseases of the digestive system: Secondary | ICD-10-CM

## 2014-06-14 DIAGNOSIS — Z9861 Coronary angioplasty status: Secondary | ICD-10-CM

## 2014-06-14 HISTORY — PX: LEFT HEART CATH: SHX5478

## 2014-06-14 LAB — URINE MICROSCOPIC-ADD ON

## 2014-06-14 LAB — CBC
HCT: 28.7 % — ABNORMAL LOW (ref 39.0–52.0)
HEMOGLOBIN: 9.1 g/dL — AB (ref 13.0–17.0)
MCH: 28.8 pg (ref 26.0–34.0)
MCHC: 31.7 g/dL (ref 30.0–36.0)
MCV: 90.8 fL (ref 78.0–100.0)
Platelets: 157 10*3/uL (ref 150–400)
RBC: 3.16 MIL/uL — ABNORMAL LOW (ref 4.22–5.81)
RDW: 14.4 % (ref 11.5–15.5)
WBC: 10.6 10*3/uL — AB (ref 4.0–10.5)

## 2014-06-14 LAB — BASIC METABOLIC PANEL
ANION GAP: 12 (ref 5–15)
BUN: 20 mg/dL (ref 6–23)
CHLORIDE: 110 meq/L (ref 96–112)
CO2: 21 mEq/L (ref 19–32)
Calcium: 8.4 mg/dL (ref 8.4–10.5)
Creatinine, Ser: 1.58 mg/dL — ABNORMAL HIGH (ref 0.50–1.35)
GFR calc Af Amer: 46 mL/min — ABNORMAL LOW (ref >90)
GFR calc non Af Amer: 40 mL/min — ABNORMAL LOW (ref >90)
GLUCOSE: 101 mg/dL — AB (ref 70–99)
Potassium: 4.2 mEq/L (ref 3.7–5.3)
Sodium: 143 mEq/L (ref 137–147)

## 2014-06-14 LAB — TROPONIN I
TROPONIN I: 1.11 ng/mL — AB (ref <0.30)
Troponin I: 2.04 ng/mL (ref <0.30)
Troponin I: 3.02 ng/mL (ref <0.30)

## 2014-06-14 LAB — HEPARIN LEVEL (UNFRACTIONATED): Heparin Unfractionated: 0.23 IU/mL — ABNORMAL LOW (ref 0.30–0.70)

## 2014-06-14 LAB — URINALYSIS, ROUTINE W REFLEX MICROSCOPIC
Bilirubin Urine: NEGATIVE
GLUCOSE, UA: NEGATIVE mg/dL
KETONES UR: NEGATIVE mg/dL
Leukocytes, UA: NEGATIVE
Nitrite: NEGATIVE
PROTEIN: NEGATIVE mg/dL
Specific Gravity, Urine: 1.018 (ref 1.005–1.030)
Urobilinogen, UA: 0.2 mg/dL (ref 0.0–1.0)
pH: 5 (ref 5.0–8.0)

## 2014-06-14 LAB — GLUCOSE, CAPILLARY
Glucose-Capillary: 96 mg/dL (ref 70–99)
Glucose-Capillary: 90 mg/dL (ref 70–99)
Glucose-Capillary: 81 mg/dL (ref 70–99)
Glucose-Capillary: 213 mg/dL — ABNORMAL HIGH (ref 70–99)

## 2014-06-14 LAB — MRSA PCR SCREENING: MRSA by PCR: NEGATIVE

## 2014-06-14 LAB — PROTIME-INR
INR: 1.21 (ref 0.00–1.49)
Prothrombin Time: 15.3 seconds — ABNORMAL HIGH (ref 11.6–15.2)

## 2014-06-14 LAB — PLATELET INHIBITION P2Y12: Platelet Function  P2Y12: 266 [PRU] (ref 194–418)

## 2014-06-14 SURGERY — LEFT HEART CATH
Anesthesia: LOCAL

## 2014-06-14 MED ORDER — FUROSEMIDE 10 MG/ML IJ SOLN
40.0000 mg | Freq: Once | INTRAMUSCULAR | Status: DC
Start: 2014-06-14 — End: 2014-06-14

## 2014-06-14 MED ORDER — ASPIRIN 81 MG PO CHEW: 324.0000 mg | CHEWABLE_TABLET | Freq: Once | ORAL | Status: AC

## 2014-06-14 MED ORDER — TICAGRELOR 90 MG PO TABS
90.0000 mg | ORAL_TABLET | Freq: Two times a day (BID) | ORAL | Status: DC
  Administered 2014-06-15 – 2014-06-19 (×9): 90 mg via ORAL
  Filled 2014-06-14 (×11): qty 1

## 2014-06-14 MED ORDER — PIPERACILLIN-TAZOBACTAM 3.375 G IVPB
3.3750 g | Freq: Three times a day (TID) | INTRAVENOUS | Status: DC
  Administered 2014-06-14 – 2014-06-17 (×8): 3.375 g via INTRAVENOUS
  Filled 2014-06-14 (×10): qty 50

## 2014-06-14 MED ORDER — ASPIRIN 81 MG PO CHEW
CHEWABLE_TABLET | ORAL | Status: AC
  Administered 2014-06-14: 04:00:00 324 mg
  Filled 2014-06-14: qty 4

## 2014-06-14 MED ORDER — SODIUM CHLORIDE 0.9 % IV SOLN
INTRAVENOUS | Status: AC
  Administered 2014-06-14: 20:00:00 via INTRAVENOUS

## 2014-06-14 MED ORDER — BIVALIRUDIN 250 MG IV SOLR
INTRAVENOUS | Status: AC
  Filled 2014-06-14: qty 250

## 2014-06-14 MED ORDER — FUROSEMIDE 10 MG/ML IJ SOLN
80.0000 mg | Freq: Once | INTRAMUSCULAR | Status: AC
  Administered 2014-06-14: 80 mg via INTRAVENOUS

## 2014-06-14 MED ORDER — TIROFIBAN (AGGRASTAT) BOLUS VIA INFUSION
25.0000 ug/kg | Freq: Once | INTRAVENOUS | Status: DC
  Filled 2014-06-14: qty 44

## 2014-06-14 MED ORDER — FUROSEMIDE 10 MG/ML IJ SOLN
INTRAMUSCULAR | Status: AC
  Administered 2014-06-14: 80 mg via INTRAVENOUS
  Filled 2014-06-14: qty 4

## 2014-06-14 MED ORDER — NITROGLYCERIN IN D5W 200-5 MCG/ML-% IV SOLN
2.0000 ug/min | INTRAVENOUS | Status: DC
  Administered 2014-06-14: 50 ug/min via INTRAVENOUS
  Administered 2014-06-14 – 2014-06-15 (×2): 40 ug/min via INTRAVENOUS
  Filled 2014-06-14 (×3): qty 250

## 2014-06-14 MED ORDER — TICAGRELOR 90 MG PO TABS
90.0000 mg | ORAL_TABLET | Freq: Two times a day (BID) | ORAL | Status: DC
  Filled 2014-06-14: qty 1

## 2014-06-14 MED ORDER — TIROFIBAN HCL IV 12.5 MG/250 ML: 0.0750 ug/kg/min | INTRAVENOUS | Status: DC

## 2014-06-14 MED ORDER — VANCOMYCIN HCL IN DEXTROSE 750-5 MG/150ML-% IV SOLN
750.0000 mg | Freq: Two times a day (BID) | INTRAVENOUS | Status: DC
  Filled 2014-06-14 (×2): qty 150

## 2014-06-14 MED ORDER — FUROSEMIDE 10 MG/ML IJ SOLN
INTRAMUSCULAR | Status: AC
  Filled 2014-06-14: qty 8

## 2014-06-14 MED ORDER — TIROFIBAN (AGGRASTAT) BOLUS VIA INFUSION
25.0000 ug/kg | Freq: Once | INTRAVENOUS | Status: AC
  Administered 2014-06-14: 2177.5 ug via INTRAVENOUS
  Filled 2014-06-14: qty 44

## 2014-06-14 MED ORDER — FUROSEMIDE 10 MG/ML IJ SOLN
INTRAMUSCULAR | Status: AC
  Filled 2014-06-14: qty 4

## 2014-06-14 MED ORDER — HEPARIN (PORCINE) IN NACL 2-0.9 UNIT/ML-% IJ SOLN
INTRAMUSCULAR | Status: AC
  Filled 2014-06-14: qty 1000

## 2014-06-14 MED ORDER — MORPHINE SULFATE 4 MG/ML IJ SOLN
3.0000 mg | Freq: Once | INTRAMUSCULAR | Status: AC
  Administered 2014-06-14: 03:00:00 3 mg via INTRAVENOUS
  Filled 2014-06-14: qty 1

## 2014-06-14 MED ORDER — ALBUTEROL SULFATE (2.5 MG/3ML) 0.083% IN NEBU
INHALATION_SOLUTION | RESPIRATORY_TRACT | Status: AC
  Filled 2014-06-14: qty 3

## 2014-06-14 MED ORDER — HEPARIN (PORCINE) IN NACL 100-0.45 UNIT/ML-% IJ SOLN
1400.0000 [IU]/h | INTRAMUSCULAR | Status: DC
  Administered 2014-06-14: 05:00:00 1200 [IU]/h via INTRAVENOUS
  Filled 2014-06-14 (×2): qty 250

## 2014-06-14 MED ORDER — VANCOMYCIN HCL IN DEXTROSE 1-5 GM/200ML-% IV SOLN
1000.0000 mg | Freq: Once | INTRAVENOUS | Status: AC
Start: 2014-06-14 — End: 2014-06-14
  Administered 2014-06-14: 1000 mg via INTRAVENOUS
  Filled 2014-06-14: qty 200

## 2014-06-14 MED ORDER — ALBUTEROL SULFATE (2.5 MG/3ML) 0.083% IN NEBU
2.5000 mg | INHALATION_SOLUTION | Freq: Once | RESPIRATORY_TRACT | Status: AC
  Administered 2014-06-14: 2.5 mg via RESPIRATORY_TRACT

## 2014-06-14 MED ORDER — PERFLUTREN LIPID MICROSPHERE
1.0000 mL | INTRAVENOUS | Status: AC | PRN
  Administered 2014-06-14: 2 mL via INTRAVENOUS
  Filled 2014-06-14: qty 10

## 2014-06-14 MED ORDER — NITROGLYCERIN IN D5W 200-5 MCG/ML-% IV SOLN
INTRAVENOUS | Status: AC
  Administered 2014-06-14: 04:00:00 10 ug
  Filled 2014-06-14: qty 250

## 2014-06-14 MED ORDER — TIROFIBAN HCL IV 5 MG/100ML
0.0750 ug/kg/min | INTRAVENOUS | Status: AC
  Administered 2014-06-14 (×2): 0.075 ug/kg/min via INTRAVENOUS
  Filled 2014-06-14 (×2): qty 100

## 2014-06-14 MED ORDER — TICAGRELOR 90 MG PO TABS
ORAL_TABLET | ORAL | Status: AC
Start: 2014-06-14 — End: 2014-06-14
  Filled 2014-06-14: qty 1

## 2014-06-14 MED ORDER — LIDOCAINE HCL (PF) 1 % IJ SOLN
INTRAMUSCULAR | Status: AC
  Filled 2014-06-14: qty 30

## 2014-06-14 MED ORDER — TICAGRELOR 90 MG PO TABS
180.0000 mg | ORAL_TABLET | Freq: Once | ORAL | Status: AC
  Administered 2014-06-14: 180 mg via ORAL
  Filled 2014-06-14: qty 2

## 2014-06-14 MED ORDER — NITROGLYCERIN 1 MG/10 ML FOR IR/CATH LAB
INTRA_ARTERIAL | Status: AC
  Filled 2014-06-14: qty 10

## 2014-06-14 MED ORDER — TICAGRELOR 90 MG PO TABS
ORAL_TABLET | ORAL | Status: AC
  Filled 2014-06-14: qty 1

## 2014-06-14 NOTE — CV Procedure (Addendum)
PERCUTANEOUS CORONARY INTERVENTION   [name] is a 80 y.o. male  INDICATION: Prolonged chest pain after saphenous vein graft DES on 06/13/2014   PROCEDURE: Angioplasty and DES SVG to obtuse marginal   CONSENT: The risks, benefits, and details of the procedure were explained to the patient. Risks including death, MI, stroke, bleeding, limb ischemia, emergency CABG, renal failure and allergy were described and accepted by the patient.  Informed written consent was obtained prior to proceeding.  PROCEDURE TECHNIQUE:  After Xylocaine anesthesia a 5 French Slender sheath was placed in the right radial artery with a single anterior needle wall stick.   Coronary guiding shots were made using a JR 4 guide catheter. Antithrombotic therapy, bivalirudin bolus and infusion, was begun and determined to be therapeutic by ACT. Antiplatelet therapy,Brilinta 180 mg, was loaded.  Angiographic images demonstrated a slight reduction in TIMI flow, but generally speaking no obvious thrombus was noted. There is a segmental region of 50-60% narrowing proximal to the previously placed stent in the SVG to the OM.  This was purposefully not stented initially because there appeared to be no hemodynamically significant obstruction. After demonstrating that the stent was patent I decided to stent the ostial to proximal saphenous vein graft slightly overlapping with the stent that was placed earlier.We could not get good guide support with the JR 4 and changed to a 0.75 AL guide. A 4.0 x 16 Promus Premier was positioned and deployed to 14 atmospheres.The deployment balloon was then used to further dilate the entire stented segment at higher pressure(14 atmospheres). TIMI grade 3 flow was noted after the procedure.  The patient came to the Cath Lab in acute pulmonary edema. IV fluids have been discontinued in the intensive care unit. 80 mg of Lasix was given and a Foley was placed in the unit. Because of low oxygen  saturations an additional 100 mg of IV Lasix was administered. O2 sats remained in the 88-93 range during the procedure.  Postprocedure the patient continued to have mild chest discomfort. Breathing was improving.  EQUIPMENT: JR 4, AL 0.75, BMW, and ProWater wire  CONTRAST:  Total of 145 cc.  COMPLICATIONS:  None.    ANGIOGRAPHIC RESULTS:   The hazy 60% stenosis proximal to the previously placed stent in the saphenous vein graft to the circumflex was stented slightly overlapping the previously placed stent. A 0% stenosis was noted and TIMI grade 3 flow was sustained after administration of intracoronary nitroglycerin and 200 mcg of intracoronary verapamil.   IMPRESSIONS:  Prolonged chest pain intermittently over the past 12 hours since saphenous vein graft stenting on 06/13/14. Initial images demonstrated that the saphenous vein graft was widely patent. P2 Y12 was 266, which is suboptimal. A segmental 50-60% stenosis proximal to the previously placed stent was treated with DES to 4 mm in diameter. The graft is widely patent post procedure with TIMI grade 3 flow . I suspect this presentation is related to suboptimal antiplatelet therapy and transient thrombosis with distal embolization from the previously placed stent. Placing the new stent in the segmental 50-60% stenosis will improve blood flow and decrease the likelihood of further clotting.   RECOMMENDATION:  Plavix is discontinued. Brilinta is started. Aggrastat will be used for 12 hours. IV Lasix was given for heart failure. Renal function may end up being compromised with the patient receiving contrast on 2 successive days. Aggressive diuresis will not help preserve renal function but is necessary because of acute heart failure. His overall  condition is guarded but improving post procedure.  If O2 sats are 90 or less he will be placed on BiPAP.[name]    Sinclair Grooms, MD 06/14/2014 5:10 PM

## 2014-06-14 NOTE — Progress Notes (Signed)
RN called to room for pt c/o increase in burning sensation in his chest.  2-3/10 pain rating.  I had been gradually titrating IV NTG down, from 40>35>15mcg.  Pt back at 40 mcg now.  CP free.  Also pt's o2 sats have been trending down, pt hanging in the high 80's, so O2 titrated up to 6L from 4, still only 89-90%.  Pt c/o of dry cough also.  On ascultation lungs are wheezy throughout.  Spoke with Dr Burke Keels, see orders.  Pt given nebulized albuterol, CXR and ECG pending.  Notified MD about upward trend in troponin also.  Cath team notified, Dr Tamala Julian on the way in to take patient urgently to Cath.  Carol Ada, RN

## 2014-06-14 NOTE — Progress Notes (Signed)
Called for fever 101.5. Pt had xray earlier today in which pneumonic infiltrate could not excluded . In hospital now for 5-6 days. Also with foley. Plan for Blood cultures, UA, start Vanc ( per Pharmacy ) and zosyn to cover possibillilty of HCAP vs UTI   Franchot Heidelberg , M.D  Cardiology , [place]

## 2014-06-14 NOTE — Progress Notes (Signed)
  Echocardiogram 2D Echocardiogram has been performed.  Darlina Sicilian M 06/14/2014, 2:15 PM

## 2014-06-14 NOTE — Interval H&P Note (Signed)
History and Physical Interval Note:  06/14/2014 3:53 PM According to the records, he began developing significant recurring episodes of chest pain in the early hours this morning. IV nitroglycerin was given. EKG revealed some ST segment changes. Nitroglycerin relieved the discomfort. I was called by Dr.Konesworen this afternoon that the patient was having unremitting chest pain and dyspnea. Chest x-ray reveals pulmonary edema. 80 mg of IV Lasix was administered. 180 mg of Brilinta was administered after the P2 Y12 assay returned greater than 230. We suspect acute stent thrombosis. DERRYN APA  has presented today for surgery, with the diagnosis of NSTEMI  The various methods of treatment have been discussed with the patient and family. After consideration of risks, benefits and other options for treatment, the patient has consented to  Procedure(s): LEFT HEART CATH (N/A) as a surgical intervention .  The patient's history has been reviewed, patient examined, no change in status, stable for surgery.  I have reviewed the patient's chart and labs.  Questions were answered to the patient's satisfaction.     Sinclair Grooms

## 2014-06-14 NOTE — Progress Notes (Signed)
CARDIAC REHAB PHASE I   Per RN, recent CP and no ambulation.  Reviewed education with pt and family.  Pt and family voiced understanding. Pt did not have questions at this time.  UT:[id number]  Lillia Dallas MS, ACSM RCEP 8:42 AM 06/14/2014

## 2014-06-14 NOTE — Progress Notes (Signed)
Consulting cardiologist: Daneen Schick MD Primary Cardiologist: [name] MD  Subjective:    Still having 1/6 chest discomfort. Feels tired and weak with minimal activity-talking on the phone. More comfortable than last night.   Objective:   Temp:  [97.5 F (36.4 C)-98.5 F (36.9 C)] 98.4 F (36.9 C) (08/01 0408) Pulse Rate:  [56-75] 74 (08/01 0723) Resp:  [16-18] 18 (08/01 0723) BP: (119-187)/(47-68) 128/52 mmHg (08/01 0723) SpO2:  [92 %-99 %] 94 % (08/01 0723) Weight:  [192 lb 0.3 oz (87.1 kg)] 192 lb 0.3 oz (87.1 kg) (08/01 0002) Last BM Date: 06/11/14  Filed Weights   06/11/14 0527 06/12/14 0410 06/14/14 0002  Weight: 187 lb 11.2 oz (85.14 kg) 191 lb (86.637 kg) 192 lb 0.3 oz (87.1 kg)    Intake/Output Summary (Last 24 hours) at 06/14/14 0741 Last data filed at 06/14/14 [id number]  Gross per 24 hour  Intake    870 ml  Output   2000 ml  Net  -1130 ml   Cardiac cath 06/11/14:  The left main coronary artery is patent.  The left anterior descending artery is totally occluded.  The left circumflex artery is is patent but contains proximal 50-70% narrowing. The first marginal contains ostial 60% narrowing. The circumflex territory is relatively small.  The right coronary artery is totally occluded in the mid vessel.  BYPASS GRAFT ANGIOGRAPHY: SVG to RCA, 50% narrowed proximally but otherwise widely patent. The distal vessel the on the graft insertion site is diffusely diseased both antegrade and retrograde and first left ventricular branches is subtotally occluded in the mid vessel.  SVG to OM1 contains segmental, proximal 95% stenosis with haziness suggesting thrombus. Obtuse marginal branches are small.  LIMA to LAD widely patent. Selective engagement was difficult but there is no significant obstruction noted. The native LAD is diffusely diseased.  LEFT VENTRICULOGRAM: Left ventricular angiogram was done in the 30 RAO projection and revealed mild mid anterior hypokinesis with an EF  of 55%   Telemetry: NSR rates in the 70's.   Exam:  General: Resting with mild chest discomfort.  HEENT: Conjunctiva and lids normal, oropharynx clear.  Lungs: Some bibasilar crackles, on O2  Cardiac: No elevated JVP or bruits. RRR, quiet rub?    Abdomen: Normoactive bowel sounds, nontender, nondistended.  Extremities: No pitting edema, distal pulses full.  Neuropsychiatric: Alert and oriented x3, affect appropriate.   Lab Results:  Basic Metabolic Panel:  Recent Labs Lab 06/12/14 0459 06/13/14 0626 06/14/14 0332  NA 143 144 143  K 3.9 4.2 4.2  CL 107 112 110  CO2 23 24 21   GLUCOSE 78 102* 101*  BUN 22 18 20   CREATININE 1.58* 1.50* 1.58*  CALCIUM 8.5 8.8 8.4    Liver Function Tests:  Recent Labs Lab 06/09/14 0055  AST 25  ALT 26  ALKPHOS 79  BILITOT 0.3  PROT 7.2  ALBUMIN 3.8    CBC:  Recent Labs Lab 06/12/14 0459 06/13/14 0626 06/14/14 0332  WBC 6.8 6.8 10.6*  HGB 9.4* 9.5* 9.1*  HCT 29.8* 29.8* 28.7*  MCV 91.7 91.1 90.8  PLT 154 151 157    Cardiac Enzymes:  Recent Labs Lab 06/09/14 0445 06/09/14 1203 06/14/14 0332  TROPONINI <0.30 <0.30 1.11*    BNP:  Recent Labs  06/08/14 1827  PROBNP 2684.0*    Coagulation:  Recent Labs Lab 06/12/14 0459 06/13/14 0626 06/14/14 0332  INR 1.26 1.12 1.21    Radiology: Pending   ECG:SR with RBBB, lateral T-wave  abnormality.    Medications:   Scheduled Medications: . acetaminophen  1,000 mg Oral Daily  . amLODipine  10 mg Oral QPM  . aspirin EC  81 mg Oral Daily  . atorvastatin  40 mg Oral q1800  . clopidogrel  75 mg Oral Daily  . colchicine  0.6 mg Oral Once per day on Mon Wed Fri  . insulin aspart  0-15 Units Subcutaneous TID WC  . insulin glargine  25 Units Subcutaneous QHS  . metoprolol succinate  50 mg Oral Daily  . multivitamin with minerals  1 tablet Oral Daily  . Warfarin - Pharmacist Dosing Inpatient   Does not apply q1800    Infusions: . sodium chloride 100  mL/hr at 06/13/14 1130  . heparin 1,200 Units/hr (06/14/14 0600)  . nitroGLYCERIN 35 mcg/min (06/14/14 0600)    PRN Medications: acetaminophen, nitroGLYCERIN, ondansetron (ZOFRAN) IV, traMADol   Assessment and Plan:   1. CAD: S/P cardiac cath on 06/13/2014 with PCI with DES to SVG to marginal, reducing stenosis from 99% to 0%. Unfortunately had severe chest pain overnight requiring a NTG gtt, heparin gtt to be restarted,along with morphine. He is now having low grade 1/6 chest pain with generalized fatigue this am. Troponin 1.11. He continues on Plavix, ASA,metoprolol 50 mg. BP is stable.  Will move to step down and continue nitro and heparin. Will repeat echo for evaluation of LV after this event, and to evaluate for pericardial effusion.  Follow troponin. Low threshold for repeating cardiac cath if pain worsens.   2. Hypertension: BP is stable on NTG and amlodipine.   3. Hypercholeterolemia: Continue statin.  4. Hx of Atrial fib: Stop coumadin on plavix and heparin.        Phill Myron. Glennda Weatherholtz NP  06/14/2014, 7:41 AM

## 2014-06-14 NOTE — H&P (View-Only) (Signed)
Consulting cardiologist: Daneen Schick MD Primary Cardiologist: [name] MD  Subjective:    Still having 1/6 chest discomfort. Feels tired and weak with minimal activity-talking on the phone. More comfortable than last night.   Objective:   Temp:  [97.5 F (36.4 C)-98.5 F (36.9 C)] 98.4 F (36.9 C) (08/01 0408) Pulse Rate:  [56-75] 74 (08/01 0723) Resp:  [16-18] 18 (08/01 0723) BP: (119-187)/(47-68) 128/52 mmHg (08/01 0723) SpO2:  [92 %-99 %] 94 % (08/01 0723) Weight:  [192 lb 0.3 oz (87.1 kg)] 192 lb 0.3 oz (87.1 kg) (08/01 0002) Last BM Date: 06/11/14  Filed Weights   06/11/14 0527 06/12/14 0410 06/14/14 0002  Weight: 187 lb 11.2 oz (85.14 kg) 191 lb (86.637 kg) 192 lb 0.3 oz (87.1 kg)    Intake/Output Summary (Last 24 hours) at 06/14/14 0741 Last data filed at 06/14/14 [id number]  Gross per 24 hour  Intake    870 ml  Output   2000 ml  Net  -1130 ml   Cardiac cath 06/11/14:  The left main coronary artery is patent.  The left anterior descending artery is totally occluded.  The left circumflex artery is is patent but contains proximal 50-70% narrowing. The first marginal contains ostial 60% narrowing. The circumflex territory is relatively small.  The right coronary artery is totally occluded in the mid vessel.  BYPASS GRAFT ANGIOGRAPHY: SVG to RCA, 50% narrowed proximally but otherwise widely patent. The distal vessel the on the graft insertion site is diffusely diseased both antegrade and retrograde and first left ventricular branches is subtotally occluded in the mid vessel.  SVG to OM1 contains segmental, proximal 95% stenosis with haziness suggesting thrombus. Obtuse marginal branches are small.  LIMA to LAD widely patent. Selective engagement was difficult but there is no significant obstruction noted. The native LAD is diffusely diseased.  LEFT VENTRICULOGRAM: Left ventricular angiogram was done in the 30 RAO projection and revealed mild mid anterior hypokinesis with an EF  of 55%   Telemetry: NSR rates in the 70's.   Exam:  General: Resting with mild chest discomfort.  HEENT: Conjunctiva and lids normal, oropharynx clear.  Lungs: Some bibasilar crackles, on O2  Cardiac: No elevated JVP or bruits. RRR, quiet rub?    Abdomen: Normoactive bowel sounds, nontender, nondistended.  Extremities: No pitting edema, distal pulses full.  Neuropsychiatric: Alert and oriented x3, affect appropriate.   Lab Results:  Basic Metabolic Panel:  Recent Labs Lab 06/12/14 0459 06/13/14 0626 06/14/14 0332  NA 143 144 143  K 3.9 4.2 4.2  CL 107 112 110  CO2 23 24 21   GLUCOSE 78 102* 101*  BUN 22 18 20   CREATININE 1.58* 1.50* 1.58*  CALCIUM 8.5 8.8 8.4    Liver Function Tests:  Recent Labs Lab 06/09/14 0055  AST 25  ALT 26  ALKPHOS 79  BILITOT 0.3  PROT 7.2  ALBUMIN 3.8    CBC:  Recent Labs Lab 06/12/14 0459 06/13/14 0626 06/14/14 0332  WBC 6.8 6.8 10.6*  HGB 9.4* 9.5* 9.1*  HCT 29.8* 29.8* 28.7*  MCV 91.7 91.1 90.8  PLT 154 151 157    Cardiac Enzymes:  Recent Labs Lab 06/09/14 0445 06/09/14 1203 06/14/14 0332  TROPONINI <0.30 <0.30 1.11*    BNP:  Recent Labs  06/08/14 1827  PROBNP 2684.0*    Coagulation:  Recent Labs Lab 06/12/14 0459 06/13/14 0626 06/14/14 0332  INR 1.26 1.12 1.21    Radiology: Pending   ECG:SR with RBBB, lateral T-wave  abnormality.    Medications:   Scheduled Medications: . acetaminophen  1,000 mg Oral Daily  . amLODipine  10 mg Oral QPM  . aspirin EC  81 mg Oral Daily  . atorvastatin  40 mg Oral q1800  . clopidogrel  75 mg Oral Daily  . colchicine  0.6 mg Oral Once per day on Mon Wed Fri  . insulin aspart  0-15 Units Subcutaneous TID WC  . insulin glargine  25 Units Subcutaneous QHS  . metoprolol succinate  50 mg Oral Daily  . multivitamin with minerals  1 tablet Oral Daily  . Warfarin - Pharmacist Dosing Inpatient   Does not apply q1800    Infusions: . sodium chloride 100  mL/hr at 06/13/14 1130  . heparin 1,200 Units/hr (06/14/14 0600)  . nitroGLYCERIN 35 mcg/min (06/14/14 0600)    PRN Medications: acetaminophen, nitroGLYCERIN, ondansetron (ZOFRAN) IV, traMADol   Assessment and Plan:   1. CAD: S/P cardiac cath on 06/13/2014 with PCI with DES to SVG to marginal, reducing stenosis from 99% to 0%. Unfortunately had severe chest pain overnight requiring a NTG gtt, heparin gtt to be restarted,along with morphine. He is now having low grade 1/6 chest pain with generalized fatigue this am. Troponin 1.11. He continues on Plavix, ASA,metoprolol 50 mg. BP is stable.  Will move to step down and continue nitro and heparin. Will repeat echo for evaluation of LV after this event, and to evaluate for pericardial effusion.  Follow troponin. Low threshold for repeating cardiac cath if pain worsens.   2. Hypertension: BP is stable on NTG and amlodipine.   3. Hypercholeterolemia: Continue statin.  4. Hx of Atrial fib: Stop coumadin on plavix and heparin.        Phill Myron. Tayte Childers NP  06/14/2014, 7:41 AM

## 2014-06-14 NOTE — Progress Notes (Signed)
Pt complaint of on and off chest pain, pt given nitroglycerin Sl X 3 doses, EKG X 2, VS stable as charted,  Dr. Tommi Rumps called and informed of pt's chest pain.  Dr. Tommi Rumps came to floor to assess pt, pt started on heparin drip, and nitro drip.  Will continue to monitor pt.

## 2014-06-14 NOTE — Progress Notes (Signed)
Report called to [facility] on [place] and transported in bed with nurse and tech.

## 2014-06-14 NOTE — Progress Notes (Signed)
ANTICOAGULATION CONSULT NOTE - Initial Consult  Pharmacy Consult for heparin Indication: chest pain/ACS  No Known Allergies  Patient Measurements: Height: 5\' 9"  (175.3 cm) Weight: 192 lb 0.3 oz (87.1 kg) IBW/kg (Calculated) : 70.7  Vital Signs: Temp: 98.4 F (36.9 C) (08/01 0408) Temp src: Oral (08/01 0408) BP: 119/51 mmHg (08/01 0408) Pulse Rate: 75 (08/01 0408)  Labs:  Recent Labs  06/12/14 0459 06/12/14 0659 06/12/14 1645 06/13/14 0626 06/14/14 0332  HGB 9.4*  --   --  9.5* 9.1*  HCT 29.8*  --   --  29.8* 28.7*  PLT 154  --   --  151 157  LABPROT 15.8*  --   --  14.4  --   INR 1.26  --   --  1.12  --   HEPARINUNFRC  --  0.17* 0.34 0.23*  --   CREATININE 1.58*  --   --  1.50*  --     Estimated Creatinine Clearance: 42.9 ml/min (by C-G formula based on Cr of 1.5).   Medical History: Past Medical History  Diagnosis Date  . GERD (gastroesophageal reflux disease)   . Anemia     Secondary to acute blood loss  . Hypertension   . Hyperlipidemia   . Diabetes mellitus     Type II  . Diabetic peripheral neuropathy   . RBBB (right bundle branch block)   . Macular degeneration   . Sleep apnea   . Carotid artery disease     Doppler 09/18/2009 - 49% bilateral stenoses  . Personal history of colonic polyps   . Diverticulosis of colon with hemorrhage   . Erectile dysfunction     Mild  . PNA (pneumonia) 9/11    NSTEMI at [facility] with repeat cath, rec medical mgmt   . Atrial flutter 07/2010    September, 2011   Hospital with PNA and cath done.Marland KitchenMarland KitchenCoumadin.  Atrial flutter ablation planned, but  pt. then had atrial fibrillation,/outpatient conversion 09/08/10..NSR..plan to follow..Dr. Caryl Comes  . GI bleeding     Off aspirin because of prior GI bleed, Severe, 2009, multiple units of blood transfused, patient taken off aspirin  . Chronic kidney disease     ckd #3  . Gout   . Skin cancer     R lower leg, per derm 2012  . Atrial fibrillation     Consideration was given for  atrial flutter ablation, but patient developed atrial fibrillation. Cardioversion was done. Dr. Caryl Comes decided to watch him clinically. November, 2011  . Carotid artery disease     49% bilateral, Doppler, November, 2010  . CAD (coronary artery disease)     Catheterization, September, 2011,  grafts patent from redo CABG,, medical therapy of coronary disease, consideration to proceeding with atrial flutter ablation  . Hx of CABG     CABG in 1995  //   redo CABG 2006  . Ejection fraction     EF 60%, echo, 2009  //   EF 65%, echo, September, 2011  . Mitral regurgitation     Mild, echo, September, 2011  . Shoulder pain     Decreasing Crestor did not affect the pain    Medications:  Prescriptions prior to admission  Medication Sig Dispense Refill  . acetaminophen (TYLENOL) 500 MG tablet Take 1,000 mg by mouth daily. For pain      . amLODipine (NORVASC) 10 MG tablet Take 10 mg by mouth every evening.      . Cholecalciferol (VITAMIN D) 1000 UNITS capsule  Take 2,000 Units by mouth daily.       . colchicine 0.6 MG tablet Take 0.6 mg by mouth 3 (three) times a week. For gout flare ups      . fish oil-omega-3 fatty acids 1000 MG capsule Take 2 g by mouth 2 (two) times daily.        . furosemide (LASIX) 80 MG tablet Take 80 mg by mouth daily. Except Saturdays and Sundays      . insulin glargine (LANTUS) 100 UNIT/ML injection Inject 25-30 Units into the skin daily. As instructed by MD      . magnesium oxide (MAG-OX) 400 MG tablet Take 400 mg by mouth daily.       . metoprolol succinate (TOPROL-XL) 50 MG 24 hr tablet Take 1 tablet (50 mg total) by mouth daily. Take with or immediately following a meal.  90 tablet  1  . Multiple Vitamin (MULTIVITAMIN WITH MINERALS) TABS Take 1 tablet by mouth daily.      . Potassium Gluconate (K-99) 595 MG CAPS Take 595 mg by mouth every evening.       . rosuvastatin (CRESTOR) 20 MG tablet Take 20 mg by mouth daily.      . sildenafil (VIAGRA) 100 MG tablet Take 100 mg  by mouth as needed (prior to sexual activity).      . traMADol (ULTRAM) 50 MG tablet Take 1 tablet (50 mg total) by mouth every 6 (six) hours as needed.  360 tablet  1  . warfarin (COUMADIN) 7.5 MG tablet Take 3.75-7.5 mg by mouth See admin instructions. Take 7.5 mg (1 tablet) every day EXCEPT on MWF. Take 3.75 mg (0.5 tablet) on MWF.      [name] Insulin Syringe-Needle U-100 (INSULIN SYRINGE .3CC/31GX5/16") 31G X 5/16" 0.3 ML MISC Use daily as instructed.        Scheduled:  . acetaminophen  1,000 mg Oral Daily  . amLODipine  10 mg Oral QPM  . aspirin      . aspirin  324 mg Oral Once  . aspirin EC  81 mg Oral Daily  . atorvastatin  40 mg Oral q1800  . clopidogrel  75 mg Oral Daily  . colchicine  0.6 mg Oral Once per day on Mon Wed Fri  . insulin aspart  0-15 Units Subcutaneous TID WC  . insulin glargine  25 Units Subcutaneous QHS  . metoprolol succinate  50 mg Oral Daily  . multivitamin with minerals  1 tablet Oral Daily  . Warfarin - Pharmacist Dosing Inpatient   Does not apply q1800   Infusions:  . sodium chloride 100 mL/hr at 06/13/14 1130  . nitroGLYCERIN      Assessment: 80yo male had cardiac cath 7/31 w/ DES implantation, pt c/o CP overnight w/ response to NTG, concern for stent thrombosis, to resume heparin.  Goal of Therapy:  Heparin level 0.3-0.7 units/ml Monitor platelets by anticoagulation protocol: Yes   Plan:  Will resume heparin gtt at 1200 units/hr and monitor heparin levels and CBC.  Wynona Neat, PharmD, BCPS  06/14/2014,4:13 AM

## 2014-06-14 NOTE — Progress Notes (Signed)
CRITICAL VALUE ALERT  Critical value received:  Troponin 1.11  Date of notification: 06/14/2014  Time of notification:  03:50  Critical value read back:Yes.    Nurse who received alert:  Maxwell Marion  MD notified (1st page):  Dr. Tommi Rumps  Time of first page:  04:00  MD notified (2nd page):  Time of second page:  Responding MD:  Dr. Tommi Rumps  Time MD responded:  04:00

## 2014-06-14 NOTE — Progress Notes (Addendum)
The patient was seen and examined, and I agree with the assessment and plan as documented above, with modifications as noted below. Pt with pronounced chest pain, worse than on admission. Initial diagnostic cath on 7/29 followed by PCI of SVG to OM on 7/31. Received 600 mg Plavix 7/29, none on 7/30, 150 mg 7/31. Now on heparin, Plavix, and received one dose of warfarin (paroxysmal atrial fibrillation). ECG demonstrates ST depression in precordial leads, more pronounced in lateral leads. Currently on 40 mcg/min IV nitro and pain is rated 1/10, only in last 45 minutes. Initial troponin 1.11.  RECS: Concern for stent thrombosis. Will check serial troponins. If continued elevation or inability to wean off nitro drip within next several hours, will proceed with repeat coronary angiography. Check P2Y12 to see if Plavix non-responder. D/c warfarin. Continue heparin, ASA, Plavix, Lipitor, and metoprolol. Has reported h/o GI bleed. Check echo for interval demise in systolic function or regional wall motion. Discussed plan with patient and family members and they are in agreement. Discussed with nurse as well.  Time spent: 60 minutes.  ADDENDUM: Called by nurse. Attempted to wean off nitro drip but pain increased after only reducing to 30 mcg/min. Pt c/o shortness of breath. P2Y12 266, thus concerning for Plavix non-responsiveness and stent thrombosis. 2nd troponin elevated to 2.04. Discussed with Dr. Pernell Dupre who placed stent to SVG.  Plan to repeat coronary angiography today. Discussed with patient's nurse.

## 2014-06-14 NOTE — Progress Notes (Signed)
ANTICOAGULATION CONSULT NOTE  Pharmacy Consult for heparin Indication: chest pain/ACS  No Known Allergies  Patient Measurements: Height: 5\' 9"  (175.3 cm) Weight: 192 lb 0.3 oz (87.1 kg) IBW/kg (Calculated) : 70.7  Vital Signs: Temp: 98.6 F (37 C) (08/01 0723) Temp src: Oral (08/01 1238) BP: 117/52 mmHg (08/01 0830) Pulse Rate: 75 (08/01 0830)  Labs:  Recent Labs  06/12/14 0459  06/12/14 1645 06/13/14 0626 06/14/14 0332 06/14/14 0900 06/14/14 1358  HGB 9.4*  --   --  9.5* 9.1*  --   --   HCT 29.8*  --   --  29.8* 28.7*  --   --   PLT 154  --   --  151 157  --   --   LABPROT 15.8*  --   --  14.4 15.3*  --   --   INR 1.26  --   --  1.12 1.21  --   --   HEPARINUNFRC  --   < > 0.34 0.23*  --   --  0.23*  CREATININE 1.58*  --   --  1.50* 1.58*  --   --   TROPONINI  --   --   --   --  1.11* 2.04*  --   < > = values in this interval not displayed.  Estimated Creatinine Clearance: 40.8 ml/min (by C-G formula based on Cr of 1.58).   Assessment: 80yo male had cardiac cath 7/31 w/ DES implantation, pt c/o CP overnight w/ response to NTG, concern for stent thrombosis, heparin resumed at 0444 am with no bolus at rate of 1200 units/hr.  HL is below goal.  CBC stable, no bleeding reported. Coumadin was dc'd 8/1 for possible re-cath/concern for stent thrombosis.    Goal of Therapy:  Heparin level 0.3-0.7 units/ml Monitor platelets by anticoagulation protocol: Yes   Plan:  Increase heparin drip to 1400 units/hr and recheck HL in 8 hrs Daily HL and CBC  Eudelia Bunch, Pharm.D. BP:[phone] 06/14/2014 2:54 PM

## 2014-06-14 NOTE — H&P (View-Only) (Signed)
The patient was seen and examined, and I agree with the assessment and plan as documented above, with modifications as noted below. Pt with pronounced chest pain, worse than on admission. Initial diagnostic cath on 7/29 followed by PCI of SVG to OM on 7/31. Received 600 mg Plavix 7/29, none on 7/30, 150 mg 7/31. Now on heparin, Plavix, and received one dose of warfarin (paroxysmal atrial fibrillation). ECG demonstrates ST depression in precordial leads, more pronounced in lateral leads. Currently on 40 mcg/min IV nitro and pain is rated 1/10, only in last 45 minutes. Initial troponin 1.11.  RECS: Concern for stent thrombosis. Will check serial troponins. If continued elevation or inability to wean off nitro drip within next several hours, will proceed with repeat coronary angiography. Check P2Y12 to see if Plavix non-responder. D/c warfarin. Continue heparin, ASA, Plavix, Lipitor, and metoprolol. Has reported h/o GI bleed. Check echo for interval demise in systolic function or regional wall motion. Discussed plan with patient and family members and they are in agreement. Discussed with nurse as well.  Time spent: 60 minutes.  ADDENDUM: Called by nurse. Attempted to wean off nitro drip but pain increased after only reducing to 30 mcg/min. Pt c/o shortness of breath. P2Y12 266, thus concerning for Plavix non-responsiveness and stent thrombosis. 2nd troponin elevated to 2.04. Discussed with Dr. Pernell Dupre who placed stent to SVG.  Plan to repeat coronary angiography today. Discussed with patient's nurse.

## 2014-06-14 NOTE — Progress Notes (Signed)
Patient complained of slight pressure mid sternal chest after making some phone calls at 0720. Nitro infusion increased from 35 mcg to 40 mcg. Patient reports no further pressure sine increase in nitro infusion and states he has felt comfortable since then.

## 2014-06-14 NOTE — Significant Event (Addendum)
Cross Cover Note  Patient developed a number of chest pain episodes this evening. CP was similar to presenting symptoms and were relieved by SL NTG. Initially pain responded nicely to SL NTG and this was presumed to potentially be related to some slow flow in setting of probable plaque embolization noted after stent placement.   Episodes became more frequent. Serial ECGs demonstrated evolving lateral ST depressions.  Given concern for the potential development of in stent thrombosis, a full strength ASA was given to chew and IV heparin and IV nitroglycerin were started. He had an excellent response to IV NTG and pain is nearly gone.  MAR review demonstrates he was given 600mg  plavix on 7/29. He apparently received 150mg  of plavix in the lab yesterday as well as a dose of ASA (per nursing). It does not appear that any maintenance dose of plavix was given on 7/30  Plan - continue UFH, IV NTG, ASA, plavix - repeat ECG - cycle troponins - Consider re-look angiography - NPO for now   6:20am Chest pain free on above regimen TnI returned positive Repeat ECG slightly improved although still some STD and T wave changes compared to baseline. Will continue current plan

## 2014-06-14 NOTE — Progress Notes (Signed)
Pt temps 101.5 A. Room a little warm. Removed some covers.   Notified md about temp before giving med.  Other VS stable. Will continue to monitor. Saunders Revel T

## 2014-06-14 NOTE — Progress Notes (Signed)
ANTIBIOTIC CONSULT NOTE - INITIAL  Pharmacy Consult for Vanco/Zosyn Indication: pneumonia  No Known Allergies  Patient Measurements: Height: 5\' 9"  (175.3 cm) Weight: 192 lb 0.3 oz (87.1 kg) IBW/kg (Calculated) : 70.7 Adjusted Body Weight:    Vital Signs: Temp: 101.5 F (38.6 C) (08/01 2045) Temp src: Axillary (08/01 2045) BP: 128/49 mmHg (08/01 2130) Pulse Rate: 83 (08/01 2130) Intake/Output from previous day: 07/31 0701 - 08/01 0700 In: [id number] [P.O.:120; I.V.:750] Out: 2000 [Urine:2000] Intake/Output from this shift: Total I/O In: 1673.3 [I.V.:248.3; Other:1425] Out: -   Labs:  Recent Labs  06/12/14 0459 06/13/14 0626 06/14/14 0332  WBC 6.8 6.8 10.6*  HGB 9.4* 9.5* 9.1*  PLT 154 151 157  CREATININE 1.58* 1.50* 1.58*   Estimated Creatinine Clearance: 40.8 ml/min (by C-G formula based on Cr of 1.58). No results found for this basename: VANCOTROUGH, Corlis Leak, VANCORANDOM, GENTTROUGH, GENTPEAK, GENTRANDOM, TOBRATROUGH, TOBRAPEAK, TOBRARND, AMIKACINPEAK, AMIKACINTROU, AMIKACIN,  in the last 72 hours   Microbiology: Recent Results (from the past 720 hour(s))  MRSA PCR SCREENING     Status: None   Collection Time    06/14/14 10:00 AM      Result Value Ref Range Status   MRSA by PCR NEGATIVE  NEGATIVE Final   Comment:            The GeneXpert MRSA Assay (FDA     approved for NASAL specimens     only), is one component of a     comprehensive MRSA colonization     surveillance program. It is not     intended to diagnose MRSA     infection nor to guide or     monitor treatment for     MRSA infections.    Medical History: Past Medical History  Diagnosis Date  . GERD (gastroesophageal reflux disease)   . Anemia     Secondary to acute blood loss  . Hypertension   . Hyperlipidemia   . Diabetes mellitus     Type II  . Diabetic peripheral neuropathy   . RBBB (right bundle branch block)   . Macular degeneration   . Sleep apnea   . Carotid artery disease    Doppler 09/18/2009 - 49% bilateral stenoses  . Personal history of colonic polyps   . Diverticulosis of colon with hemorrhage   . Erectile dysfunction     Mild  . PNA (pneumonia) 9/11    NSTEMI at [facility] with repeat cath, rec medical mgmt   . Atrial flutter 07/2010    September, 2011   Hospital with PNA and cath done.Marland KitchenMarland KitchenCoumadin.  Atrial flutter ablation planned, but  pt. then had atrial fibrillation,/outpatient conversion 09/08/10..NSR..plan to follow..Dr. Caryl Comes  . GI bleeding     Off aspirin because of prior GI bleed, Severe, 2009, multiple units of blood transfused, patient taken off aspirin  . Chronic kidney disease     ckd #3  . Gout   . Skin cancer     R lower leg, per derm 2012  . Atrial fibrillation     Consideration was given for atrial flutter ablation, but patient developed atrial fibrillation. Cardioversion was done. Dr. Caryl Comes decided to watch him clinically. November, 2011  . Carotid artery disease     49% bilateral, Doppler, November, 2010  . CAD (coronary artery disease)     Catheterization, September, 2011,  grafts patent from redo CABG,, medical therapy of coronary disease, consideration to proceeding with atrial flutter ablation  . Hx of CABG  CABG in 1995  //   redo CABG 2006  . Ejection fraction     EF 60%, echo, 2009  //   EF 65%, echo, September, 2011  . Mitral regurgitation     Mild, echo, September, 2011  . Shoulder pain     Decreasing Crestor did not affect the pain    Assessment: HCAP 80 y/o M with new fever during extended hospitalization. Plan to start Vanco/Zosyn to cover for HCAP vs UTI.  Goal of Therapy:  Vancomycin trough level 15-20 mcg/ml  Plan:  Zosyn 3.375g IV q8hr. Vanco 1g IV x 1, then 750mg  IV q12h Vanco trough after 3-5 doses at steady state.  [name], PharmD, BCPS Clinical Staff Pharmacist Pager [phone]  [name] 06/14/2014,9:52 PM

## 2014-06-15 ENCOUNTER — Inpatient Hospital Stay (HOSPITAL_COMMUNITY): Payer: Medicare Other

## 2014-06-15 DIAGNOSIS — K922 Gastrointestinal hemorrhage, unspecified: Secondary | ICD-10-CM

## 2014-06-15 DIAGNOSIS — J189 Pneumonia, unspecified organism: Secondary | ICD-10-CM

## 2014-06-15 DIAGNOSIS — R509 Fever, unspecified: Secondary | ICD-10-CM

## 2014-06-15 DIAGNOSIS — I5031 Acute diastolic (congestive) heart failure: Secondary | ICD-10-CM

## 2014-06-15 DIAGNOSIS — I509 Heart failure, unspecified: Secondary | ICD-10-CM

## 2014-06-15 DIAGNOSIS — G988 Other disorders of nervous system: Secondary | ICD-10-CM

## 2014-06-15 DIAGNOSIS — I110 Hypertensive heart disease with heart failure: Secondary | ICD-10-CM

## 2014-06-15 LAB — BASIC METABOLIC PANEL
Anion gap: 14 (ref 5–15)
BUN: 24 mg/dL — ABNORMAL HIGH (ref 6–23)
CHLORIDE: 105 meq/L (ref 96–112)
CO2: 21 meq/L (ref 19–32)
Calcium: 7.9 mg/dL — ABNORMAL LOW (ref 8.4–10.5)
Creatinine, Ser: 1.91 mg/dL — ABNORMAL HIGH (ref 0.50–1.35)
GFR calc Af Amer: 37 mL/min — ABNORMAL LOW (ref >90)
GFR calc non Af Amer: 32 mL/min — ABNORMAL LOW (ref >90)
GLUCOSE: 186 mg/dL — AB (ref 70–99)
POTASSIUM: 3.8 meq/L (ref 3.7–5.3)
SODIUM: 140 meq/L (ref 137–147)

## 2014-06-15 LAB — CBC
HEMATOCRIT: 26.2 % — AB (ref 39.0–52.0)
HEMOGLOBIN: 8.3 g/dL — AB (ref 13.0–17.0)
MCH: 28.4 pg (ref 26.0–34.0)
MCHC: 31.7 g/dL (ref 30.0–36.0)
MCV: 89.7 fL (ref 78.0–100.0)
Platelets: 160 10*3/uL (ref 150–400)
RBC: 2.92 MIL/uL — ABNORMAL LOW (ref 4.22–5.81)
RDW: 14.6 % (ref 11.5–15.5)
WBC: 8.7 10*3/uL (ref 4.0–10.5)

## 2014-06-15 LAB — GLUCOSE, CAPILLARY
GLUCOSE-CAPILLARY: 150 mg/dL — AB (ref 70–99)
GLUCOSE-CAPILLARY: 167 mg/dL — AB (ref 70–99)
Glucose-Capillary: 116 mg/dL — ABNORMAL HIGH (ref 70–99)
Glucose-Capillary: 175 mg/dL — ABNORMAL HIGH (ref 70–99)

## 2014-06-15 LAB — PROTIME-INR
INR: 1.47 (ref 0.00–1.49)
Prothrombin Time: 17.8 seconds — ABNORMAL HIGH (ref 11.6–15.2)

## 2014-06-15 MED ORDER — FUROSEMIDE 10 MG/ML IJ SOLN
40.0000 mg | Freq: Once | INTRAMUSCULAR | Status: AC
  Administered 2014-06-15: 40 mg via INTRAVENOUS
  Filled 2014-06-15: qty 4

## 2014-06-15 MED ORDER — VANCOMYCIN HCL 10 G IV SOLR
1250.0000 mg | INTRAVENOUS | Status: DC
  Administered 2014-06-15 – 2014-06-16 (×2): 1250 mg via INTRAVENOUS
  Filled 2014-06-15 (×3): qty 1250

## 2014-06-15 NOTE — Progress Notes (Signed)
SUBJECTIVE: Pt says "I feel much, much better", denying chest pain and says shortness of breath markedly improved.     Intake/Output Summary (Last 24 hours) at 06/15/14 0848 Last data filed at 06/15/14 0600  Gross per 24 hour  Intake 3233.05 ml  Output    630 ml  Net 2603.05 ml    Current Facility-Administered Medications  Medication Dose Route Frequency Provider Last Rate Last Dose  . acetaminophen (TYLENOL) tablet 1,000 mg  1,000 mg Oral Daily Jacolyn Reedy, MD   1,000 mg at 06/14/14 [id number]  . acetaminophen (TYLENOL) tablet 650 mg  650 mg Oral Q4H PRN Belva Crome III, MD   650 mg at 06/14/14 2210  . amLODipine (NORVASC) tablet 10 mg  10 mg Oral QPM Jacolyn Reedy, MD   10 mg at 06/13/14 1817  . aspirin EC tablet 81 mg  81 mg Oral Daily Jacolyn Reedy, MD   81 mg at 06/14/14 0900  . atorvastatin (LIPITOR) tablet 40 mg  40 mg Oral q1800 Jacolyn Reedy, MD   40 mg at 06/13/14 1844  . colchicine tablet 0.6 mg  0.6 mg Oral Once per day on Mon Wed Fri Jacolyn Reedy, MD   0.6 mg at 06/13/14 0753  . insulin aspart (novoLOG) injection 0-15 Units  0-15 Units Subcutaneous TID WC Jacolyn Reedy, MD   3 Units at 06/13/14 1800  . insulin glargine (LANTUS) injection 25 Units  25 Units Subcutaneous QHS Jacolyn Reedy, MD   25 Units at 06/14/14 2129  . metoprolol succinate (TOPROL-XL) 24 hr tablet 50 mg  50 mg Oral Daily Jacolyn Reedy, MD   50 mg at 06/14/14 0900  . multivitamin with minerals tablet 1 tablet  1 tablet Oral Daily Jacolyn Reedy, MD   1 tablet at 06/12/14 1104  . nitroGLYCERIN (NITROSTAT) SL tablet 0.4 mg  0.4 mg Sublingual Q5 Min x 3 PRN Jacolyn Reedy, MD   0.4 mg at 06/14/14 0230  . nitroGLYCERIN 50 mg in dextrose 5 % 250 mL (0.2 mg/mL) infusion  2-200 mcg/min Intravenous Titrated Lamar Sprinkles, MD 12 mL/hr at 06/15/14 0553 40 mcg/min at 06/15/14 0553  . ondansetron (ZOFRAN) injection 4 mg  4 mg Intravenous Q6H PRN Jacolyn Reedy, MD   4 mg at  06/14/14 0240  . piperacillin-tazobactam (ZOSYN) IVPB 3.375 g  3.375 g Intravenous 3 times per day Wayland Salinas, RPH   3.375 g at 06/15/14 0553  . ticagrelor (BRILINTA) tablet 90 mg  90 mg Oral BID Carlena Bjornstad, MD      . traMADol Veatrice Bourbon) tablet 50 mg  50 mg Oral Q6H PRN Jacolyn Reedy, MD   50 mg at 06/09/14 1100  . vancomycin (VANCOCIN) IVPB 750 mg/150 ml premix  750 mg Intravenous Q12H Crystal La Grande, RPH        Filed Vitals:   06/15/14 0400 06/15/14 0500 06/15/14 0600 06/15/14 0700  BP: 130/52 129/52 121/47 121/50  Pulse: 75 71 72 77  Temp:    100.8 F (38.2 C)  TempSrc:    Axillary  Resp: 21 18 22 24   Height:      Weight:      SpO2: 99% 98% 100% 99%    PHYSICAL EXAM General: NAD Neck: No JVD, no thyromegaly.  Lungs: Crackles 1/3 up b/l, no wheezes. CV: Nondisplaced PMI.  Regular rate and rhythm, normal S1/S2, no S3/S4, no murmur.  No  pretibial edema.  No carotid bruit.  Normal pedal pulses.  Abdomen: Soft, nontender, no hepatosplenomegaly, no distention.  Neurologic: Alert and oriented x 3.  Psych: Normal affect. Extremities: No clubbing or cyanosis.   TELEMETRY: Reviewed telemetry pt in sinus rhythm.  LABS: Basic Metabolic Panel:  Recent Labs  06/14/14 0332 06/15/14 0306  NA 143 140  K 4.2 3.8  CL 110 105  CO2 21 21  GLUCOSE 101* 186*  BUN 20 24*  CREATININE 1.58* 1.91*  CALCIUM 8.4 7.9*   Liver Function Tests: No results found for this basename: AST, ALT, ALKPHOS, BILITOT, PROT, ALBUMIN,  in the last 72 hours No results found for this basename: LIPASE, AMYLASE,  in the last 72 hours CBC:  Recent Labs  06/14/14 0332 06/15/14 0306  WBC 10.6* 8.7  HGB 9.1* 8.3*  HCT 28.7* 26.2*  MCV 90.8 89.7  PLT 157 160   Cardiac Enzymes:  Recent Labs  06/14/14 0332 06/14/14 0900 06/14/14 1358  TROPONINI 1.11* 2.04* 3.02*   BNP: No components found with this basename: POCBNP,  D-Dimer: No results found for this  basename: DDIMER,  in the last 72 hours Hemoglobin A1C: No results found for this basename: HGBA1C,  in the last 72 hours Fasting Lipid Panel: No results found for this basename: CHOL, HDL, LDLCALC, TRIG, CHOLHDL, LDLDIRECT,  in the last 72 hours Thyroid Function Tests: No results found for this basename: TSH, T4TOTAL, FREET3, T3FREE, THYROIDAB,  in the last 72 hours Anemia Panel: No results found for this basename: VITAMINB12, FOLATE, FERRITIN, TIBC, IRON, RETICCTPCT,  in the last 72 hours  RADIOLOGY: Dg Chest 2 View  06/15/2014   CLINICAL DATA:  Fever, cough, recent heart catheterization  EXAM: CHEST  2 VIEW  COMPARISON:  06/14/2014  FINDINGS: Mild patchy perihilar opacities, most prominently in the right upper lobe, suspicious for pneumonia or asymmetric edema. Suspected trace pleural effusions. No pneumothorax.  The heart is normal in size. Postsurgical changes related to prior CABG.  Degenerative changes of the visualized thoracolumbar spine.  IMPRESSION: Patchy perihilar opacities, suspicious for pneumonia or asymmetric edema.   Electronically Signed   By: Julian Hy M.D.   On: 06/15/2014 07:30   Dg Chest Port 1 View  06/14/2014   CLINICAL DATA:  Increasing chest pain with shortness of Breath  EXAM: PORTABLE CHEST - 1 VIEW  COMPARISON:  06/08/2014  FINDINGS: The cardiac shadow is stable. Postoperative changes are again noted. The left lung remains clear. Vascular congestion and diffuse increased density in the right mid lung is noted. Although this may represent a component of pulmonary edema the possibility of an underlying pulmonic infiltrate could not be totally excluded.  IMPRESSION: Findings consistent with vascular congestion and likely unilateral pulmonary edema.   Electronically Signed   By: Inez Catalina M.D.   On: 06/14/2014 15:50   Dg Chest Port 1 View  06/08/2014   CLINICAL DATA:  Chest pain.  EXAM: PORTABLE CHEST - 1 VIEW  COMPARISON:  01/01/2013  FINDINGS: Heart size and  pulmonary vascularity are normal. Lungs are clear except for minimal scarring at the left base laterally. No effusions. No acute osseous abnormality. CABG.  IMPRESSION: No acute abnormalities.   Electronically Signed   By: Rozetta Nunnery M.D.   On: 06/08/2014 18:55      ASSESSMENT AND PLAN: 1. CAD/NSTEMI/probable transient stent thrombosis: Initial PCI of SVG to OM on 7/31. Due to recalcitrant chest pain, elevated troponin (peaked at 3.02) and ST depressions in lateral  leads on 8/1, repeat coronary angiography performed with additional PCI with overlapping stents on 8/1. Appears to be Plavix non-responder and switched to Brilinta (P2Y12 266). Pt symptomatically stable today. Also on tirofiban for 12 hours. Hgb dropped to 8.3 from 9.1, some dilutional but also h/o GI bleed. Will check hemoccults and transfuse if Hgb < 8. Presentation likely due to suboptimal antiplatelet therapy and transient thrombosis with distal embolization from the previously placed stent. LV systolic function normal, grade II diastolic dysfunction with elevated filling pressures. Continue nitro drip for now but will likely be able to wean on 8/3. Continue ASA, metoprolol, and Lipitor.  2. Acute diastolic heart failure: Likely driven by ischemia. Grade 2 diastolic dysfunction. Received IV Lasix on 8/1. Will give one additional dose IV as pt continues to have rales. BP controlled on amlodipine and metoprolol.  3. Acute on chronic CKD: Due to diuresis, which is required given acute pulmonary edema with residual diastolic decompensation/pulmonary edema today. Will need to closely monitor.  4. Fevers/possible HCAP: Continue vancomycin and Zosyn (pharmacy to dose given renal insufficiency).  5. Anemia: History of GI bleed. Will check hemoccults and transfuse if Hgb < 8. On ASA and Brilinta. Tirofiban x 12 hours completed.  6. Paroxysmal atrial fibrillation: Currently in sinus rhythm. No warfarin as he is on dual antiplatelet therapy  with dropping Hgb.   7. Essential HTN: On amlodipine. Controlled.  8. Hyperlipidemia: On Lipitor.    [name], M.D., F.A.C.C.

## 2014-06-15 NOTE — Progress Notes (Signed)
ANTIBIOTIC CONSULT NOTE  - follow up Pharmacy Consult for Vanco/Zosyn Indication: pneumonia  No Known Allergies  Patient Measurements: Height: 5\' 9"  (175.3 cm) Weight: 192 lb 0.3 oz (87.1 kg) IBW/kg (Calculated) : 70.7   Vital Signs: Temp: 100.8 F (38.2 C) (08/02 0700) Temp src: Axillary (08/02 0700) BP: 121/50 mmHg (08/02 0700) Pulse Rate: 77 (08/02 0700) Intake/Output from previous day: 08/01 0701 - 08/02 0700 In: 4533.1 [P.O.:120; I.V.:2725.6; IV Piggyback:262.5] Out: 630 [Urine:630] Intake/Output from this shift:    Labs:  Recent Labs  06/13/14 0626 06/14/14 0332 06/15/14 0306  WBC 6.8 10.6* 8.7  HGB 9.5* 9.1* 8.3*  PLT 151 157 160  CREATININE 1.50* 1.58* 1.91*   Estimated Creatinine Clearance: 33.7 ml/min (by C-G formula based on Cr of 1.91). No results found for this basename: VANCOTROUGH, Corlis Leak, VANCORANDOM, GENTTROUGH, GENTPEAK, GENTRANDOM, TOBRATROUGH, TOBRAPEAK, TOBRARND, AMIKACINPEAK, AMIKACINTROU, AMIKACIN,  in the last 72 hours   Microbiology: Recent Results (from the past 720 hour(s))  MRSA PCR SCREENING     Status: None   Collection Time    06/14/14 10:00 AM      Result Value Ref Range Status   MRSA by PCR NEGATIVE  NEGATIVE Final   Comment:            The GeneXpert MRSA Assay (FDA     approved for NASAL specimens     only), is one component of a     comprehensive MRSA colonization     surveillance program. It is not     intended to diagnose MRSA     infection nor to guide or     monitor treatment for     MRSA infections.     Assessment: HCAP 80 y/o M with new fever during extended hospitalization.  Started on  Vanco/Zosyn last night to cover for HCAP vs UTI. WBC 10.6>8.7; creat 1.58>1.91, temp 101.5>100.8; CXR: patchy opacities suspicious for PNA.  vanc 8/1>> Zosyn 8/1>> 8/1 BCx2 8/1 Ucx  Goal of Therapy:  Vancomycin trough level 15-20 mcg/ml  Plan:   continueZosyn 3.375g IV q8hr. Change vanc dose to 1250 mg q24 hrs due to  worsening renal fxn F/u renal fxn, wbc, temp, clinical course Steady-state vanc trough as needed Eudelia Bunch, Pharm.D. [id number] 06/15/2014 9:37 AM

## 2014-06-16 DIAGNOSIS — I2510 Atherosclerotic heart disease of native coronary artery without angina pectoris: Secondary | ICD-10-CM

## 2014-06-16 DIAGNOSIS — Z789 Other specified health status: Secondary | ICD-10-CM

## 2014-06-16 DIAGNOSIS — Z8679 Personal history of other diseases of the circulatory system: Secondary | ICD-10-CM

## 2014-06-16 DIAGNOSIS — I2584 Coronary atherosclerosis due to calcified coronary lesion: Secondary | ICD-10-CM

## 2014-06-16 LAB — CBC
HCT: 24.3 % — ABNORMAL LOW (ref 39.0–52.0)
HEMOGLOBIN: 7.9 g/dL — AB (ref 13.0–17.0)
MCH: 29.5 pg (ref 26.0–34.0)
MCHC: 32.5 g/dL (ref 30.0–36.0)
MCV: 90.7 fL (ref 78.0–100.0)
Platelets: 148 10*3/uL — ABNORMAL LOW (ref 150–400)
RBC: 2.68 MIL/uL — ABNORMAL LOW (ref 4.22–5.81)
RDW: 14.8 % (ref 11.5–15.5)
WBC: 9.2 10*3/uL (ref 4.0–10.5)

## 2014-06-16 LAB — URINE CULTURE
CULTURE: NO GROWTH
Colony Count: NO GROWTH

## 2014-06-16 LAB — BASIC METABOLIC PANEL
ANION GAP: 16 — AB (ref 5–15)
BUN: 30 mg/dL — ABNORMAL HIGH (ref 6–23)
CALCIUM: 8.3 mg/dL — AB (ref 8.4–10.5)
CO2: 21 mEq/L (ref 19–32)
Chloride: 105 mEq/L (ref 96–112)
Creatinine, Ser: 2.13 mg/dL — ABNORMAL HIGH (ref 0.50–1.35)
GFR calc Af Amer: 32 mL/min — ABNORMAL LOW (ref >90)
GFR calc non Af Amer: 28 mL/min — ABNORMAL LOW (ref >90)
GLUCOSE: 160 mg/dL — AB (ref 70–99)
Potassium: 3.4 mEq/L — ABNORMAL LOW (ref 3.7–5.3)
SODIUM: 142 meq/L (ref 137–147)

## 2014-06-16 LAB — GLUCOSE, CAPILLARY
GLUCOSE-CAPILLARY: 160 mg/dL — AB (ref 70–99)
GLUCOSE-CAPILLARY: 127 mg/dL — AB (ref 70–99)
Glucose-Capillary: 143 mg/dL — ABNORMAL HIGH (ref 70–99)
Glucose-Capillary: 174 mg/dL — ABNORMAL HIGH (ref 70–99)

## 2014-06-16 LAB — POCT ACTIVATED CLOTTING TIME
ACTIVATED CLOTTING TIME: 512 s
Activated Clotting Time: 726 seconds

## 2014-06-16 LAB — PREPARE RBC (CROSSMATCH)

## 2014-06-16 MED ORDER — POTASSIUM CHLORIDE CRYS ER 20 MEQ PO TBCR
40.0000 meq | EXTENDED_RELEASE_TABLET | Freq: Once | ORAL | Status: AC
  Administered 2014-06-16: 40 meq via ORAL
  Filled 2014-06-16: qty 2

## 2014-06-16 MED ORDER — ISOSORBIDE MONONITRATE ER 30 MG PO TB24
30.0000 mg | ORAL_TABLET | Freq: Every day | ORAL | Status: DC
  Administered 2014-06-16 – 2014-06-19 (×4): 30 mg via ORAL
  Filled 2014-06-16 (×4): qty 1

## 2014-06-16 MED ORDER — SODIUM CHLORIDE 0.9 % IV SOLN: INTRAVENOUS | Status: DC

## 2014-06-16 MED ORDER — NITROGLYCERIN IN D5W 200-5 MCG/ML-% IV SOLN
2.0000 ug/min | INTRAVENOUS | Status: DC
  Administered 2014-06-16: 10 ug/min via INTRAVENOUS

## 2014-06-16 MED ORDER — FUROSEMIDE 10 MG/ML IJ SOLN
80.0000 mg | Freq: Once | INTRAMUSCULAR | Status: AC
  Administered 2014-06-16: 80 mg via INTRAVENOUS
  Filled 2014-06-16: qty 8

## 2014-06-16 MED ORDER — PANTOPRAZOLE SODIUM 40 MG PO TBEC
40.0000 mg | DELAYED_RELEASE_TABLET | Freq: Every day | ORAL | Status: DC
  Administered 2014-06-16 – 2014-06-18 (×3): 40 mg via ORAL
  Filled 2014-06-16 (×3): qty 1

## 2014-06-16 MED ORDER — ATORVASTATIN CALCIUM 80 MG PO TABS
80.0000 mg | ORAL_TABLET | Freq: Every day | ORAL | Status: DC
  Administered 2014-06-16 – 2014-06-18 (×3): 80 mg via ORAL
  Filled 2014-06-16 (×4): qty 1

## 2014-06-16 MED ORDER — SODIUM CHLORIDE 0.9 % IV SOLN
Freq: Once | INTRAVENOUS | Status: AC
  Administered 2014-06-16: 14:00:00 via INTRAVENOUS

## 2014-06-16 MED FILL — Sodium Chloride IV Soln 0.9%: INTRAVENOUS | Qty: 50 | Status: AC

## 2014-06-16 NOTE — Progress Notes (Signed)
Subjective:  No CP/SOB. Ambulated w/o difficulty  Objective:  Temp:  [98.3 F (36.8 C)-100.2 F (37.9 C)] 98.3 F (36.8 C) (08/03 0803) Pulse Rate:  [76-109] 83 (08/03 1120) Resp:  [13-28] 19 (08/03 1120) BP: (95-154)/(47-67) 122/53 mmHg (08/03 1120) SpO2:  [90 %-97 %] 93 % (08/03 1120) FiO2 (%):  [45 %] 45 % (08/03 0803) Weight change:   Intake/Output from previous day: 08/02 0701 - 08/03 0700 In: 792.1 [P.O.:120; I.V.:189.6; IV Piggyback:362.5] Out: 850 [Urine:850]  Intake/Output from this shift: Total I/O In: 265 [P.O.:240; IV Piggyback:25] Out: -   Physical Exam: General appearance: alert and no distress Neck: no adenopathy, no carotid bruit, no JVD, supple, symmetrical, trachea midline and thyroid not enlarged, symmetric, no tenderness/mass/nodules Lungs: clear to auscultation bilaterally Heart: regular rate and rhythm, S1, S2 normal, no murmur, click, rub or gallop Extremities: extremities normal, atraumatic, no cyanosis or edema  Lab Results: Results for orders placed during the hospital encounter of 06/08/14 (from the past 48 hour(s))  GLUCOSE, CAPILLARY     Status: None   Collection Time    06/14/14 12:43 PM      Result Value Ref Range   Glucose-Capillary 81  70 - 99 mg/dL  TROPONIN I     Status: Abnormal   Collection Time    06/14/14  1:58 PM      Result Value Ref Range   Troponin I 3.02 (*) <0.30 ng/mL   Comment:            Due to the release kinetics of cTnI,     a negative result within the first hours     of the onset of symptoms does not rule out     myocardial infarction with certainty.     If myocardial infarction is still suspected,     repeat the test at appropriate intervals.     CRITICAL VALUE NOTED.  VALUE IS CONSISTENT WITH PREVIOUSLY REPORTED AND CALLED VALUE.  HEPARIN LEVEL (UNFRACTIONATED)     Status: Abnormal   Collection Time    06/14/14  1:58 PM      Result Value Ref Range   Heparin Unfractionated 0.23 (*) 0.30 - 0.70 IU/mL    Comment:            IF HEPARIN RESULTS ARE BELOW     EXPECTED VALUES, AND PATIENT     DOSAGE HAS BEEN CONFIRMED,     SUGGEST FOLLOW UP TESTING     OF ANTITHROMBIN III LEVELS.  GLUCOSE, CAPILLARY     Status: Abnormal   Collection Time    06/14/14  9:27 PM      Result Value Ref Range   Glucose-Capillary 213 (*) 70 - 99 mg/dL  CULTURE, BLOOD (ROUTINE X 2)     Status: None   Collection Time    06/14/14 10:10 PM      Result Value Ref Range   Specimen Description BLOOD LEFT WRIST     Special Requests BOTTLES DRAWN AEROBIC AND ANAEROBIC 5CC EA     Culture  Setup Time       Value: 06/15/2014 00:58     Performed at [facility]   Culture       Value:        BLOOD CULTURE RECEIVED NO GROWTH TO DATE CULTURE WILL BE HELD FOR 5 DAYS BEFORE ISSUING A FINAL NEGATIVE REPORT     Performed at [facility]   Report Status PENDING  URINE CULTURE     Status: None   Collection Time    06/14/14 10:10 PM      Result Value Ref Range   Specimen Description URINE, RANDOM     Special Requests NONE     Culture  Setup Time       Value: 06/14/2014 23:00     Performed at [facility]       Value: NO GROWTH     Performed at [facility]   Culture       Value: NO GROWTH     Performed at [facility]   Report Status 06/16/2014 FINAL    URINALYSIS, ROUTINE W REFLEX MICROSCOPIC     Status: Abnormal   Collection Time    06/14/14 10:10 PM      Result Value Ref Range   Color, Urine YELLOW  YELLOW   APPearance CLEAR  CLEAR   Specific Gravity, Urine 1.018  1.005 - 1.030   pH 5.0  5.0 - 8.0   Glucose, UA NEGATIVE  NEGATIVE mg/dL   Hgb urine dipstick SMALL (*) NEGATIVE   Bilirubin Urine NEGATIVE  NEGATIVE   Ketones, ur NEGATIVE  NEGATIVE mg/dL   Protein, ur NEGATIVE  NEGATIVE mg/dL   Urobilinogen, UA 0.2  0.0 - 1.0 mg/dL   Nitrite NEGATIVE  NEGATIVE   Leukocytes, UA NEGATIVE  NEGATIVE  URINE MICROSCOPIC-ADD ON     Status: Abnormal   Collection  Time    06/14/14 10:10 PM      Result Value Ref Range   Squamous Epithelial / LPF FEW (*) RARE   WBC, UA 0-2  <3 WBC/hpf   RBC / HPF 7-10  <3 RBC/hpf   Bacteria, UA FEW (*) RARE   Casts HYALINE CASTS (*) NEGATIVE  CULTURE, BLOOD (ROUTINE X 2)     Status: None   Collection Time    06/14/14 10:25 PM      Result Value Ref Range   Specimen Description BLOOD LEFT HAND     Special Requests BOTTLES DRAWN AEROBIC ONLY 5CC     Culture  Setup Time       Value: 06/15/2014 00:58     Performed at [facility]   Culture       Value:        BLOOD CULTURE RECEIVED NO GROWTH TO DATE CULTURE WILL BE HELD FOR 5 DAYS BEFORE ISSUING A FINAL NEGATIVE REPORT     Performed at [facility]   Report Status PENDING    PROTIME-INR     Status: Abnormal   Collection Time    06/15/14  3:06 AM      Result Value Ref Range   Prothrombin Time 17.8 (*) 11.6 - 15.2 seconds   INR 1.47  0.00 - 6.64  BASIC METABOLIC PANEL     Status: Abnormal   Collection Time    06/15/14  3:06 AM      Result Value Ref Range   Sodium 140  137 - 147 mEq/L   Potassium 3.8  3.7 - 5.3 mEq/L   Chloride 105  96 - 112 mEq/L   CO2 21  19 - 32 mEq/L   Glucose, Bld 186 (*) 70 - 99 mg/dL   BUN 24 (*) 6 - 23 mg/dL   Creatinine, Ser 1.91 (*) 0.50 - 1.35 mg/dL   Calcium 7.9 (*) 8.4 - 10.5 mg/dL   GFR calc non Af Amer 32 (*) >90 mL/min  GFR calc Af Amer 37 (*) >90 mL/min   Comment: (NOTE)     The eGFR has been calculated using the CKD EPI equation.     This calculation has not been validated in all clinical situations.     eGFR's persistently <90 mL/min signify possible Chronic Kidney     Disease.   Anion gap 14  5 - 15  CBC     Status: Abnormal   Collection Time    06/15/14  3:06 AM      Result Value Ref Range   WBC 8.7  4.0 - 10.5 K/uL   RBC 2.92 (*) 4.22 - 5.81 MIL/uL   Hemoglobin 8.3 (*) 13.0 - 17.0 g/dL   HCT 26.2 (*) 39.0 - 52.0 %   MCV 89.7  78.0 - 100.0 fL   MCH 28.4  26.0 - 34.0 pg   MCHC 31.7  30.0 -  36.0 g/dL   RDW 14.6  11.5 - 15.5 %   Platelets 160  150 - 400 K/uL  GLUCOSE, CAPILLARY     Status: Abnormal   Collection Time    06/15/14  7:26 AM      Result Value Ref Range   Glucose-Capillary 150 (*) 70 - 99 mg/dL  GLUCOSE, CAPILLARY     Status: Abnormal   Collection Time    06/15/14 12:48 PM      Result Value Ref Range   Glucose-Capillary 167 (*) 70 - 99 mg/dL  GLUCOSE, CAPILLARY     Status: Abnormal   Collection Time    06/15/14  5:07 PM      Result Value Ref Range   Glucose-Capillary 116 (*) 70 - 99 mg/dL  GLUCOSE, CAPILLARY     Status: Abnormal   Collection Time    06/15/14  9:36 PM      Result Value Ref Range   Glucose-Capillary 175 (*) 70 - 99 mg/dL  BASIC METABOLIC PANEL     Status: Abnormal   Collection Time    06/16/14  2:27 AM      Result Value Ref Range   Sodium 142  137 - 147 mEq/L   Potassium 3.4 (*) 3.7 - 5.3 mEq/L   Chloride 105  96 - 112 mEq/L   CO2 21  19 - 32 mEq/L   Glucose, Bld 160 (*) 70 - 99 mg/dL   BUN 30 (*) 6 - 23 mg/dL   Creatinine, Ser 2.13 (*) 0.50 - 1.35 mg/dL   Calcium 8.3 (*) 8.4 - 10.5 mg/dL   GFR calc non Af Amer 28 (*) >90 mL/min   GFR calc Af Amer 32 (*) >90 mL/min   Comment: (NOTE)     The eGFR has been calculated using the CKD EPI equation.     This calculation has not been validated in all clinical situations.     eGFR's persistently <90 mL/min signify possible Chronic Kidney     Disease.   Anion gap 16 (*) 5 - 15  CBC     Status: Abnormal   Collection Time    06/16/14  8:44 AM      Result Value Ref Range   WBC 9.2  4.0 - 10.5 K/uL   RBC 2.68 (*) 4.22 - 5.81 MIL/uL   Hemoglobin 7.9 (*) 13.0 - 17.0 g/dL   HCT 24.3 (*) 39.0 - 52.0 %   MCV 90.7  78.0 - 100.0 fL   MCH 29.5  26.0 - 34.0 pg   MCHC 32.5  30.0 - 36.0  g/dL   RDW 14.8  11.5 - 15.5 %   Platelets 148 (*) 150 - 400 K/uL  GLUCOSE, CAPILLARY     Status: Abnormal   Collection Time    06/16/14  9:18 AM      Result Value Ref Range   Glucose-Capillary 143 (*) 70 - 99  mg/dL    Imaging: Imaging results have been reviewed  Assessment/Plan:   1. Active Problems: 2.   Type 2 diabetes mellitus with peripheral neuropathy 3.   CAD (coronary artery disease) 4.   Long-term (current) use of anticoagulants 5.   Unstable angina 6.   Time Spent Directly with Patient:  20 minutes  Length of Stay:  LOS: 8 days   Pt admitted with [facility]/ACS. Cath by Dr. Sheldon Silvan last Tues showed SVG-OM disease and intervention was staged secondary to contrast and CRI. Had PCI/Stent on Friday and recurrent CP with EKG changes over weekend with + troponins. He was taken back to cath lab by [name] and had re-intervention on prox SVG-OM. Question acute stent thrombosis secondary to plavix non responder (P2Y12 266). Currently feels better, pain free. No SOB. IV NTG being weaned. Will start PO long acting nitrate (Imdur 30 mg).  He has acute on chronic RI with initial SCr 1.6, currently 2.2. He was on lasix 80 mg PO daily at home. Suspect this will improve. He has preserved LV systolic function with grade 2 DD. Will start back on home lasix dose. He received one dose of IV lasix yesterday with I/O neg 750 cc.  His HGB has steadily decreased from 11 to 7.8. H/O GIB in the past secondary to diverticulosis. Was on coumadin for PAF. Now on asa and brilenta. Will hold off on coumadin A/C for now (no recurrent AFIB). Guaiac stools. Start PPI. Will transfuse 1 unit PRBCs.  [name] J 06/16/2014, 11:45 AM

## 2014-06-16 NOTE — Progress Notes (Signed)
Additional Medicare IM copy given to patient. Jilian West RN CCM Case Mgmt phone [phone] 

## 2014-06-16 NOTE — Progress Notes (Addendum)
INTERVENTIONAL CARDIOLOGY  I reviewed the patient's clinical data. He did not have a significant enzyme bump. He did develop acute diastolic heart failure related to hydration to avoid renal failure and acute diastolic dysfunction from ischemia. He is now -1 L. When cumulative I&O is evaluated. Creatinine has bumped greater than 2.0. Hemoglobin is pending.   Diagnosis: 1. Acute diastolic heart failure 2. Plavix resistance with presumed stent thrombosis 06/14/14 3. CAD/bypass graft disease with DES x2; 7/31 and 06/14/49 4. Acute on chronic kidney injury related to contrast and diuresis   Wean and DC nitroglycerin by noontime  Ambulate  Check hemoglobin  Avoid diuretics today if possible  Consider addition of Coumadin for A. fib/flutter, depending upon today's hemoglobin and other clinical features per rounding team.

## 2014-06-16 NOTE — Progress Notes (Signed)
CARDIAC REHAB PHASE I   PRE:  Rate/Rhythm: 78 SR    BP: sitting 95/54    SaO2: 95 RA  MODE:  Ambulation: 350 ft   POST:  Rate/Rhythm: 106 ST    BP: sitting 122/53     SaO2: 95 RA  Pt anxious to walk. Used RW, assist x1. Weaker than normal for him. SOB with walking and talking (talked entire walk). X2 rest stops. Upon sitting in recliner pt had about 6 fast narrow beats, o/w NSR.  Pt thankful to walk and sts he is progressing. Will f/u. [id number]  Josephina Shih [place] CES, ACSM 06/16/2014 11:27 AM

## 2014-06-17 LAB — GLUCOSE, CAPILLARY
GLUCOSE-CAPILLARY: 160 mg/dL — AB (ref 70–99)
GLUCOSE-CAPILLARY: 182 mg/dL — AB (ref 70–99)
Glucose-Capillary: 104 mg/dL — ABNORMAL HIGH (ref 70–99)
Glucose-Capillary: 98 mg/dL (ref 70–99)

## 2014-06-17 LAB — TYPE AND SCREEN
ABO/RH(D): O NEG
Antibody Screen: NEGATIVE
Unit division: 0

## 2014-06-17 LAB — BASIC METABOLIC PANEL
ANION GAP: 14 (ref 5–15)
BUN: 30 mg/dL — ABNORMAL HIGH (ref 6–23)
CO2: 22 mEq/L (ref 19–32)
Calcium: 8.6 mg/dL (ref 8.4–10.5)
Chloride: 106 mEq/L (ref 96–112)
Creatinine, Ser: 1.88 mg/dL — ABNORMAL HIGH (ref 0.50–1.35)
GFR, EST AFRICAN AMERICAN: 37 mL/min — AB (ref >90)
GFR, EST NON AFRICAN AMERICAN: 32 mL/min — AB (ref >90)
GLUCOSE: 161 mg/dL — AB (ref 70–99)
POTASSIUM: 3.5 meq/L — AB (ref 3.7–5.3)
SODIUM: 142 meq/L (ref 137–147)

## 2014-06-17 LAB — CBC
HCT: 30.3 % — ABNORMAL LOW (ref 39.0–52.0)
Hemoglobin: 10 g/dL — ABNORMAL LOW (ref 13.0–17.0)
MCH: 30 pg (ref 26.0–34.0)
MCHC: 33 g/dL (ref 30.0–36.0)
MCV: 91 fL (ref 78.0–100.0)
PLATELETS: 154 10*3/uL (ref 150–400)
RBC: 3.33 MIL/uL — ABNORMAL LOW (ref 4.22–5.81)
RDW: 14.6 % (ref 11.5–15.5)
WBC: 10.5 10*3/uL (ref 4.0–10.5)

## 2014-06-17 MED ORDER — POTASSIUM CHLORIDE CRYS ER 20 MEQ PO TBCR
40.0000 meq | EXTENDED_RELEASE_TABLET | Freq: Once | ORAL | Status: AC
  Administered 2014-06-17: 40 meq via ORAL
  Filled 2014-06-17: qty 2

## 2014-06-17 NOTE — Progress Notes (Signed)
CARDIAC REHAB PHASE I   PRE:  Rate/Rhythm: 77 SR    BP: sitting 118/45    SaO2: 98 RA  MODE:  Ambulation: 350 ft   POST:  Rate/Rhythm: 91 SR    BP: sitting 119/60     SaO2:   Pt feeling better today. Used RW. Less SOB and denied CP. Able to talk entire walk. Will continue to follow. [id number]   [name] [place] CES, ACSM 06/17/2014 2:30 PM

## 2014-06-17 NOTE — Progress Notes (Signed)
Subjective:  Pt had recurrent CP last night requiring SL NTG and restarting low dose IV NTG  Objective:  Temp:  [98.1 F (36.7 C)-100.4 F (38 C)] 98.7 F (37.1 C) (08/04 1155) Pulse Rate:  [67-99] 67 (08/04 1300) Resp:  [14-36] 23 (08/04 1300) BP: (105-154)/(44-83) 118/45 mmHg (08/04 1300) SpO2:  [85 %-99 %] 96 % (08/04 1300) Weight change:   Intake/Output from previous day: 08/03 0701 - 08/04 0700 In: 685.5 [P.O.:360; I.V.:150.5; IV Piggyback:175] Out: 9562 [Urine:1825; Stool:1]  Intake/Output from this shift: Total I/O In: 55 [I.V.:55] Out: -   Physical Exam: General appearance: alert and no distress Neck: no adenopathy, no carotid bruit, no JVD, supple, symmetrical, trachea midline and thyroid not enlarged, symmetric, no tenderness/mass/nodules Lungs: clear to auscultation bilaterally Heart: regular rate and rhythm, S1, S2 normal, no murmur, click, rub or gallop Extremities: extremities normal, atraumatic, no cyanosis or edema  Lab Results: Results for orders placed during the hospital encounter of 06/08/14 (from the past 48 hour(s))  GLUCOSE, CAPILLARY     Status: Abnormal   Collection Time    06/15/14  5:07 PM      Result Value Ref Range   Glucose-Capillary 116 (*) 70 - 99 mg/dL  GLUCOSE, CAPILLARY     Status: Abnormal   Collection Time    06/15/14  9:36 PM      Result Value Ref Range   Glucose-Capillary 175 (*) 70 - 99 mg/dL  BASIC METABOLIC PANEL     Status: Abnormal   Collection Time    06/16/14  2:27 AM      Result Value Ref Range   Sodium 142  137 - 147 mEq/L   Potassium 3.4 (*) 3.7 - 5.3 mEq/L   Chloride 105  96 - 112 mEq/L   CO2 21  19 - 32 mEq/L   Glucose, Bld 160 (*) 70 - 99 mg/dL   BUN 30 (*) 6 - 23 mg/dL   Creatinine, Ser 2.13 (*) 0.50 - 1.35 mg/dL   Calcium 8.3 (*) 8.4 - 10.5 mg/dL   GFR calc non Af Amer 28 (*) >90 mL/min   GFR calc Af Amer 32 (*) >90 mL/min   Comment: (NOTE)     The eGFR has been calculated using the CKD EPI  equation.     This calculation has not been validated in all clinical situations.     eGFR's persistently <90 mL/min signify possible Chronic Kidney     Disease.   Anion gap 16 (*) 5 - 15  CBC     Status: Abnormal   Collection Time    06/16/14  8:44 AM      Result Value Ref Range   WBC 9.2  4.0 - 10.5 K/uL   RBC 2.68 (*) 4.22 - 5.81 MIL/uL   Hemoglobin 7.9 (*) 13.0 - 17.0 g/dL   HCT 24.3 (*) 39.0 - 52.0 %   MCV 90.7  78.0 - 100.0 fL   MCH 29.5  26.0 - 34.0 pg   MCHC 32.5  30.0 - 36.0 g/dL   RDW 14.8  11.5 - 15.5 %   Platelets 148 (*) 150 - 400 K/uL  GLUCOSE, CAPILLARY     Status: Abnormal   Collection Time    06/16/14  9:18 AM      Result Value Ref Range   Glucose-Capillary 143 (*) 70 - 99 mg/dL  GLUCOSE, CAPILLARY     Status: Abnormal   Collection Time    06/16/14 12:07  PM      Result Value Ref Range   Glucose-Capillary 160 (*) 70 - 99 mg/dL  PREPARE RBC (CROSSMATCH)     Status: None   Collection Time    06/16/14  1:47 PM      Result Value Ref Range   Order Confirmation ORDER PROCESSED BY BLOOD BANK    TYPE AND SCREEN     Status: None   Collection Time    06/16/14  1:47 PM      Result Value Ref Range   ABO/RH(D) O NEG     Antibody Screen NEG     Sample Expiration 06/19/2014     Unit Number [id number]     Blood Component Type RED CELLS,LR     Unit division 00     Status of Unit ISSUED,FINAL     Transfusion Status OK TO TRANSFUSE     Crossmatch Result Compatible    GLUCOSE, CAPILLARY     Status: Abnormal   Collection Time    06/16/14  5:28 PM      Result Value Ref Range   Glucose-Capillary 127 (*) 70 - 99 mg/dL  GLUCOSE, CAPILLARY     Status: Abnormal   Collection Time    06/16/14 10:35 PM      Result Value Ref Range   Glucose-Capillary 174 (*) 70 - 99 mg/dL  CBC     Status: Abnormal   Collection Time    06/17/14 12:06 AM      Result Value Ref Range   WBC 10.5  4.0 - 10.5 K/uL   RBC 3.33 (*) 4.22 - 5.81 MIL/uL   Hemoglobin 10.0 (*) 13.0 - 17.0 g/dL    Comment: POST TRANSFUSION SPECIMEN   HCT 30.3 (*) 39.0 - 52.0 %   MCV 91.0  78.0 - 100.0 fL   MCH 30.0  26.0 - 34.0 pg   MCHC 33.0  30.0 - 36.0 g/dL   RDW 14.6  11.5 - 15.5 %   Platelets 154  150 - 400 K/uL  BASIC METABOLIC PANEL     Status: Abnormal   Collection Time    06/17/14 12:06 AM      Result Value Ref Range   Sodium 142  137 - 147 mEq/L   Potassium 3.5 (*) 3.7 - 5.3 mEq/L   Chloride 106  96 - 112 mEq/L   CO2 22  19 - 32 mEq/L   Glucose, Bld 161 (*) 70 - 99 mg/dL   BUN 30 (*) 6 - 23 mg/dL   Creatinine, Ser 1.88 (*) 0.50 - 1.35 mg/dL   Calcium 8.6  8.4 - 10.5 mg/dL   GFR calc non Af Amer 32 (*) >90 mL/min   GFR calc Af Amer 37 (*) >90 mL/min   Comment: (NOTE)     The eGFR has been calculated using the CKD EPI equation.     This calculation has not been validated in all clinical situations.     eGFR's persistently <90 mL/min signify possible Chronic Kidney     Disease.   Anion gap 14  5 - 15  GLUCOSE, CAPILLARY     Status: Abnormal   Collection Time    06/17/14  8:00 AM      Result Value Ref Range   Glucose-Capillary 104 (*) 70 - 99 mg/dL  GLUCOSE, CAPILLARY     Status: Abnormal   Collection Time    06/17/14 11:57 AM      Result Value Ref Range   Glucose-Capillary 160 (*)  70 - 99 mg/dL    Imaging: Imaging results have been reviewed  Assessment/Plan:   1. Active Problems: 2.   Type 2 diabetes mellitus with peripheral neuropathy 3.   CAD (coronary artery disease) 4.   Long-term (current) use of anticoagulants 5.   Unstable angina 6.   Time Spent Directly with Patient:  20 minutes  Length of Stay:  LOS: 9 days   1. NSTEMI- S/P LCX SVG PCI/Stent X 2 with DES. Plavix non responder now on Brilenta. Imdur started. Recurrent CP yesterday. Will attempt to wean IV NTG off. Keep in stepdown today.  2. Anemia- S/P Tx 1 Unit PRBCs with increase in HGB to 10. On PPI. Will check stool guiacs.  3. Acute on Chronic RI- Received IV lasix last night with good  diuresis. SCr decreased 2.2 to 1.8  Laurell Coalson J 06/17/2014, 1:39 PM

## 2014-06-18 ENCOUNTER — Encounter (HOSPITAL_COMMUNITY): Payer: Self-pay | Admitting: Physician Assistant

## 2014-06-18 DIAGNOSIS — R195 Other fecal abnormalities: Secondary | ICD-10-CM

## 2014-06-18 DIAGNOSIS — D62 Acute posthemorrhagic anemia: Secondary | ICD-10-CM

## 2014-06-18 LAB — BASIC METABOLIC PANEL
ANION GAP: 13 (ref 5–15)
BUN: 28 mg/dL — ABNORMAL HIGH (ref 6–23)
CHLORIDE: 108 meq/L (ref 96–112)
CO2: 24 mEq/L (ref 19–32)
CREATININE: 1.8 mg/dL — AB (ref 0.50–1.35)
Calcium: 8.5 mg/dL (ref 8.4–10.5)
GFR calc Af Amer: 39 mL/min — ABNORMAL LOW (ref >90)
GFR calc non Af Amer: 34 mL/min — ABNORMAL LOW (ref >90)
GLUCOSE: 105 mg/dL — AB (ref 70–99)
Potassium: 3.4 mEq/L — ABNORMAL LOW (ref 3.7–5.3)
Sodium: 145 mEq/L (ref 137–147)

## 2014-06-18 LAB — GLUCOSE, CAPILLARY
GLUCOSE-CAPILLARY: 135 mg/dL — AB (ref 70–99)
GLUCOSE-CAPILLARY: 133 mg/dL — AB (ref 70–99)
Glucose-Capillary: 101 mg/dL — ABNORMAL HIGH (ref 70–99)
Glucose-Capillary: 137 mg/dL — ABNORMAL HIGH (ref 70–99)

## 2014-06-18 LAB — MAGNESIUM: Magnesium: 2.2 mg/dL (ref 1.5–2.5)

## 2014-06-18 MED ORDER — SODIUM CHLORIDE 0.9 % IV SOLN
INTRAVENOUS | Status: DC
  Administered 2014-06-18: 23:00:00 via INTRAVENOUS

## 2014-06-18 NOTE — Consult Note (Signed)
[facility] Consult: 2:49 PM 06/18/2014  LOS: 10 days    Referring Provider: Dr Debara Pickett  Primary Care Physician:  [name], MD Primary Gastroenterologist:  Dr. Carlean Purl    Reason for Consultation:  assess anemia, FOBT + stool and bleeding risk    HPI: [name] is a 80 y.o. male. Very active.  Hx vascular disease.  S/P CABG 1999 and redo CABG 2006. S/p CEA. On Chronic Coumadin for hx PAF.  Hx several unit diverticular bleed in 2009, at that time ASA discontinued.    Admitted 7/26 with chest pain and negative Troponins.  Diagnostic cath 7/29. 7/31 cath #2 and DES to SVG cfx graft. Started on Plavix (high doses 7/29 and 7/31, none on 7/30).  Developed chest pain and elevated Troponins day 1 post cath. S/p Cath # 3 on 8/1, with angioplasty and DES to SVG to obtuse marginal.  Brilinta placed Plavix, as labelled a Plavix non-responder.  Warfarin on hold.   Fever on 8/1 treated with Zosyn, Vanco. Likely PNA on CXR.  Acute on chronic renal failure is removing.  Cardiologist considering adding Warfarin to the ASA and Brilinta but concerned due to hx LGIB, some recent minor dark stool and anemia.  Has been transfused with 1 unit PRBCs for Hgb drift from 9.5 to 7.9 and Hgb is up to 10.  Yesterday, pt saw "dark and tarry stool" once, the next stool, soon after tarry stool, was brown.   No ASA, NSAIDs PTA.  No GI problems.  No ETOH.  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      Past Medical History  Diagnosis Date  . GERD (gastroesophageal reflux disease)   . Anemia     Secondary to acute blood loss  . Hypertension   . Hyperlipidemia   . Diabetes mellitus     Type II  . Diabetic peripheral neuropathy   . RBBB (right bundle branch block)   . Macular degeneration   .  Sleep apnea   . Carotid artery disease     Doppler 09/18/2009 - 49% bilateral stenoses  . Personal history of colonic polyps   . Diverticulosis of colon with hemorrhage 2009    several unit diverticular bleed   . Erectile dysfunction     Mild  . PNA (pneumonia) 9/11    NSTEMI at [facility] with repeat cath, rec medical mgmt   . Atrial flutter 07/2010    September, 2011   Hospital with PNA and cath done.Marland KitchenMarland KitchenCoumadin.  Atrial flutter ablation planned, but  pt. then had atrial fibrillation,/outpatient conversion 09/08/10..NSR..plan to follow..Dr. Caryl Comes  . Chronic kidney disease     ckd #3  . Gout   . Skin cancer     R lower leg, per derm 2012  . Atrial fibrillation     Consideration was given for atrial flutter ablation, but patient developed atrial fibrillation. Cardioversion was done. Dr. Caryl Comes decided to watch him clinically. November, 2011  . Carotid artery disease     49% bilateral, Doppler, November, 2010  .  CAD (coronary artery disease)     Catheterization, September, 2011,  grafts patent from redo CABG,, medical therapy of coronary disease, consideration to proceeding with atrial flutter ablation  . Ejection fraction     EF 60%, echo, 2009  //   EF 65%, echo, September, 2011  . Mitral regurgitation     Mild, echo, September, 2011  . Shoulder pain     Decreasing Crestor did not affect the pain    Past Surgical History  Procedure Laterality Date  . Cardiac catheterization  2006    Nuclear..slight lateral ischemia..medical therapy  . Cardiac catheterization  08/04/2010    grafts patent from redo CABG...medical Rx and ablate Atrial flutter (LV not injected)   . Doppler echocardiography  08/2008    EF 60%  . Doppler echocardiography  08/02/2010    65-70%  . Doppler echocardiography  07/2010    MR mild  . Coronary artery bypass graft  1995     Redo  CABG 2006  . Polypectomy    . Cataract extraction    . Vasectomy    . Tonsillectomy    . Carotid endarterectomy    . Coronary artery  bypass graft  1995, 2006  . Cholecystectomy  2008    Prior to Admission medications   Medication Sig Start Date End Date Taking? Authorizing Provider  acetaminophen (TYLENOL) 500 MG tablet Take 1,000 mg by mouth daily. For pain   Yes Historical Provider, MD  amLODipine (NORVASC) 10 MG tablet Take 10 mg by mouth every evening.   Yes Historical Provider, MD  Cholecalciferol (VITAMIN D) 1000 UNITS capsule Take 2,000 Units by mouth daily.    Yes Historical Provider, MD  colchicine 0.6 MG tablet Take 0.6 mg by mouth 3 (three) times a week. For gout flare ups   Yes Historical Provider, MD  fish oil-omega-3 fatty acids 1000 MG capsule Take 2 g by mouth 2 (two) times daily.     Yes Historical Provider, MD  furosemide (LASIX) 80 MG tablet Take 80 mg by mouth daily. Except Saturdays and Sundays   Yes Historical Provider, MD  insulin glargine (LANTUS) 100 UNIT/ML injection Inject 25-30 Units into the skin daily. As instructed by MD   Yes Historical Provider, MD  magnesium oxide (MAG-OX) 400 MG tablet Take 400 mg by mouth daily.    Yes Historical Provider, MD  metoprolol succinate (TOPROL-XL) 50 MG 24 hr tablet Take 1 tablet (50 mg total) by mouth daily. Take with or immediately following a meal. 04/30/14  Yes Carlena Bjornstad, MD  Multiple Vitamin (MULTIVITAMIN WITH MINERALS) TABS Take 1 tablet by mouth daily.   Yes Historical Provider, MD  Potassium Gluconate (K-99) 595 MG CAPS Take 595 mg by mouth every evening.    Yes Historical Provider, MD  rosuvastatin (CRESTOR) 20 MG tablet Take 20 mg by mouth daily.   Yes Historical Provider, MD  sildenafil (VIAGRA) 100 MG tablet Take 100 mg by mouth as needed (prior to sexual activity).   Yes Historical Provider, MD  traMADol (ULTRAM) 50 MG tablet Take 1 tablet (50 mg total) by mouth every 6 (six) hours as needed. 05/12/14  Yes Tonia Ghent, MD  warfarin (COUMADIN) 7.5 MG tablet Take 3.75-7.5 mg by mouth See admin instructions. Take 7.5 mg (1 tablet) every day  EXCEPT on MWF. Take 3.75 mg (0.5 tablet) on MWF.   Yes Historical Provider, MD  Insulin Syringe-Needle U-100 (INSULIN SYRINGE .3CC/31GX5/16") 31G X 5/16" 0.3 ML MISC Use daily as  instructed.    Historical Provider, MD    Scheduled Meds: . acetaminophen  1,000 mg Oral Daily  . amLODipine  10 mg Oral QPM  . aspirin EC  81 mg Oral Daily  . atorvastatin  80 mg Oral q1800  . colchicine  0.6 mg Oral Once per day on Mon Wed Fri  . insulin aspart  0-15 Units Subcutaneous TID WC  . insulin glargine  25 Units Subcutaneous QHS  . isosorbide mononitrate  30 mg Oral Daily  . metoprolol succinate  50 mg Oral Daily  . multivitamin with minerals  1 tablet Oral Daily  . pantoprazole  40 mg Oral QHS  . ticagrelor  90 mg Oral BID   Infusions: . sodium chloride Stopped (06/17/14 1330)  . nitroGLYCERIN Stopped (06/17/14 1330)   PRN Meds: acetaminophen, nitroGLYCERIN, ondansetron (ZOFRAN) IV, traMADol   Allergies as of 06/08/2014  . (No Known Allergies)    Family History  Problem Relation Age of Onset  . Kidney disease Mother     Kidney failure  . Stroke Mother   . Diabetes Mother   . Heart disease Father     MI  . Arthritis Sister   . Cancer Sister     Throat  . Heart disease Sister     MI  . Diabetes Sister   . Prostate cancer Neg Hx   . Colon cancer Neg Hx     History   Social History  . Marital Status: Widowed    Spouse Name: N/A    Number of Children: 2  . Years of Education: N/A   Occupational History  . 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 Smoking status: Former Smoker    Quit date: 11/14/1958  . Smokeless tobacco: Never Used  . Alcohol Use: No  . Drug Use: No  . Sexual Activity: Not on file   Other Topics Concern  . Not on file   Social History Narrative   From [place].  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  Widower 12/2005    REVIEW OF SYSTEMS: Constitutional:  Stable weight and  energy ENT:  No nose bleeds Pulm:  No SOB or cough CV:  No palpitations, no LE edema. No recurrent chest pain GU:  No hematuria, no frequency GI:  Per HPI Heme:  Per HPI   Transfusions:  Per HPI Neuro:  No headaches, no peripheral tingling or numbness Derm:  No itching, no rash or sores.  Endocrine:  No sweats or chills.  No polyuria or dysuria Immunization:  Not investigated.  Travel:  None beyond local counties in last few months.    PHYSICAL EXAM: Vital signs in last 24 hours: Filed Vitals:   06/18/14 1148  BP: 142/57  Pulse:   Temp: 98.6 F (37 C)  Resp: 18   Wt Readings from Last 3 Encounters:  06/17/14 85.049 kg (187 lb 8 oz)  06/17/14 85.049 kg (187 lb 8 oz)  06/17/14 85.049 kg (187 lb 8 oz)    General: pleasant elderly man, does not look his age Head:  asymmetry or swelling  Eyes:  No icterus or pallor Ears:  Not HOH  Nose:  No congestion or discharge Mouth:  Clear, moist.   Neck:  No mass, no JVD Lungs:  Clear.  No SOB, no cough Heart: RRR.  No MRG Abdomen:  soft.  NT, ND.  No mass, no HSM.  Active BS Rectal: stool is brown, soft and FOBT +   Musc/Skeltl: no joint swelling Extremities:  No pedal edema  Neurologic:  No tremor, no limb weakness.  Oriented x 3.  Skin:  No rash, no sores.  Tattoos:  none Nodes:  No cervical adenopathy   Psych:  Pleasant, relaxed.   Intake/Output from previous day: 08/04 0701 - 08/05 0700 In: 641 [P.O.:560; I.V.:81] Out: 800 [Urine:800] Intake/Output this shift: Total I/O In: 200 [P.O.:200] Out: 200 [Urine:200]  LAB RESULTS:  Recent Labs  06/16/14 0844 06/17/14 0006  WBC 9.2 10.5  HGB 7.9* 10.0*  HCT 24.3* 30.3*  PLT 148* 154   BMET Lab Results  Component Value Date   NA 145 06/18/2014   NA 142 06/17/2014   NA 142 06/16/2014   K 3.4* 06/18/2014   K 3.5* 06/17/2014   K 3.4* 06/16/2014   CL 108 06/18/2014   CL 106 06/17/2014   CL 105 06/16/2014   CO2 24 06/18/2014   CO2 22 06/17/2014   CO2 21 06/16/2014   GLUCOSE 105*  06/18/2014   GLUCOSE 161* 06/17/2014   GLUCOSE 160* 06/16/2014   BUN 28* 06/18/2014   BUN 30* 06/17/2014   BUN 30* 06/16/2014   CREATININE 1.80* 06/18/2014   CREATININE 1.88* 06/17/2014   CREATININE 2.13* 06/16/2014   CALCIUM 8.5 06/18/2014   CALCIUM 8.6 06/17/2014   CALCIUM 8.3* 06/16/2014   LFT No results found for this basename: PROT, ALBUMIN, AST, ALT, ALKPHOS, BILITOT, BILIDIR, IBILI,  in the last 72 hours PT/INR Lab Results  Component Value Date   INR 1.47 06/15/2014   INR 1.21 06/14/2014   INR 1.12 06/13/2014    Drugs of Abuse  No results found for this basename: labopia,  cocainscrnur,  labbenz,  amphetmu,  thcu,  labbarb     RADIOLOGY STUDIES: No results found.  ENDOSCOPIC STUDIES: 08/2008  Colonoscopy  Dr Carlean Purl For hematochezia.  1) PAN-DIVERTICULOSIS WITH ONGOING BLEEDING. FRESH BLOOD IN LUMEN AND  SOME CLOTS. COULD NOT LOCALIZE A BLEEDING SITE.  2) INTERNAL HEMORRHOIDS  3) OTHERWISE NO ABNORMALITIES, FAIR PREP  4) DO NOT CONSIDER THIS ADEQUATE EXAM FOR POLYP SURVEILANCE  EGD:  None found but pt recalls having this as part of GIB work up in 2009  12/2004  Colonoscopy  Dr Velora Heckler.  Diverticulosis, hemorrhoids.      IMPRESSION:   *  CAD.  DES 7/31.  NonSTEMI and instent restenosis with 8/1 cath and DES.  Considered Plavix non-responder so now on Brilinta and low dose ASA.    *  PAF.  On Coumadin PTA.  ? Restart this.  *  FOBT + stool and anemia.   Anemia likely multifactorial with all the recent vascular interventions/caths,   *  Slight isolated thrombocytopenia 8/3, resolved.     PLAN:     *  Per Dr Hilarie Fredrickson.    [name]  06/18/2014, 2:49 PM Pager: [phone]

## 2014-06-18 NOTE — Consult Note (Signed)
Patient seen, examined, and I agree with the above documentation, including the assessment and plan. No evidence for brisk bleeding, though stool to be brown but heme positive.  On aspirin, Brilinta after recent in-stent thrombosis failing Plavix, and previously warfarin for history of A. fib (paroxysmal). Aspirin and Brilinta need to be continued without cessation for now. Colonoscopy 2006, Dr. Velora Heckler: diverticulosis and internal hemorrhoids, colonoscopy 2009, Dr. Carlean Purl: Diverticulosis with diverticular hemorrhage I recommended upper endoscopy to exclude high-risk upper GI lesion which could bleed while on anticoagulation. We discussed upper endoscopy including the risks and benefits and he is agreeable to proceed Colonoscopy will be deferred for now given that he is on anticoagulation and polypectomy would be high risk and not advisable while on Brilinta.  He technically would be to repeat colorectal cancer screening in 2016. I recommended her seeing a colonoscopy as an outpatient as soon as he is able to hold anticoagulation preprocedure. He understands the recommendation and agrees with the plan If EGD negative, then expect d/c home soon per cardiology

## 2014-06-18 NOTE — Progress Notes (Signed)
CARDIAC REHAB PHASE I   PRE:  Rate/Rhythm: 80 SR  BP:  Supine:   Sitting: 148/74  Standing:    SaO2: 98 RA  MODE:  Ambulation: 700 ft   POST:  Rate/Rhythm: 81 SR  BP:  Supine:   Sitting: 130/71  Standing:    SaO2: 98 RA 1450-1515 Assisted X 1 and used walker to ambulate. Gait steady with walker. Pt states that he is feeling much better today. He denies any cp or SOB. VS stable Pt to side of bed after walk with call light in reach.  Rodney Langton RN 06/18/2014 3:14 PM

## 2014-06-18 NOTE — Progress Notes (Signed)
Subjective:  Feels better today. No chest pain. Creatinine has returned to baseline at 1.8. H/H now 10/30 after 1U PRBC's. He reported BRBPR 2 days ago, but has not seen it since. Stool guaiacs have not been sent. Apparent history of diverticulosis and previously scoped by Dr. Carlean Purl.  Objective:  Temp:  [98 F (36.7 C)-99.2 F (37.3 C)] 98.6 F (37 C) (08/05 1148) Pulse Rate:  [77-81] 81 (08/05 0300) Resp:  [15-30] 18 (08/05 1148) BP: (125-142)/(57-73) 142/57 mmHg (08/05 1148) SpO2:  [92 %-98 %] 94 % (08/05 1148) Weight:  [187 lb 8 oz (85.049 kg)] 187 lb 8 oz (85.049 kg) (08/04 2312) Weight change:   Intake/Output from previous day: 08/04 0701 - 08/05 0700 In: 641 [P.O.:560; I.V.:81] Out: 800 [Urine:800]  Intake/Output from this shift: Total I/O In: 200 [P.O.:200] Out: 200 [Urine:200]  Physical Exam: General appearance: alert and no distress Neck: no adenopathy, no carotid bruit, no JVD, supple, symmetrical, trachea midline and thyroid not enlarged, symmetric, no tenderness/mass/nodules Lungs: clear to auscultation bilaterally Heart: regular rate and rhythm, S1, S2 normal, no murmur, click, rub or gallop Extremities: extremities normal, atraumatic, no cyanosis or edema  Lab Results: Results for orders placed during the hospital encounter of 06/08/14 (from the past 48 hour(s))  PREPARE RBC (CROSSMATCH)     Status: None   Collection Time    06/16/14  1:47 PM      Result Value Ref Range   Order Confirmation ORDER PROCESSED BY BLOOD BANK    TYPE AND SCREEN     Status: None   Collection Time    06/16/14  1:47 PM      Result Value Ref Range   ABO/RH(D) O NEG     Antibody Screen NEG     Sample Expiration 06/19/2014     Unit Number [id number]     Blood Component Type RED CELLS,LR     Unit division 00     Status of Unit ISSUED,FINAL     Transfusion Status OK TO TRANSFUSE     Crossmatch Result Compatible    GLUCOSE, CAPILLARY     Status: Abnormal   Collection  Time    06/16/14  5:28 PM      Result Value Ref Range   Glucose-Capillary 127 (*) 70 - 99 mg/dL  GLUCOSE, CAPILLARY     Status: Abnormal   Collection Time    06/16/14 10:35 PM      Result Value Ref Range   Glucose-Capillary 174 (*) 70 - 99 mg/dL  CBC     Status: Abnormal   Collection Time    06/17/14 12:06 AM      Result Value Ref Range   WBC 10.5  4.0 - 10.5 K/uL   RBC 3.33 (*) 4.22 - 5.81 MIL/uL   Hemoglobin 10.0 (*) 13.0 - 17.0 g/dL   Comment: POST TRANSFUSION SPECIMEN   HCT 30.3 (*) 39.0 - 52.0 %   MCV 91.0  78.0 - 100.0 fL   MCH 30.0  26.0 - 34.0 pg   MCHC 33.0  30.0 - 36.0 g/dL   RDW 14.6  11.5 - 15.5 %   Platelets 154  150 - 400 K/uL  BASIC METABOLIC PANEL     Status: Abnormal   Collection Time    06/17/14 12:06 AM      Result Value Ref Range   Sodium 142  137 - 147 mEq/L   Potassium 3.5 (*) 3.7 - 5.3 mEq/L   Chloride 106  96 - 112 mEq/L   CO2 22  19 - 32 mEq/L   Glucose, Bld 161 (*) 70 - 99 mg/dL   BUN 30 (*) 6 - 23 mg/dL   Creatinine, Ser 1.88 (*) 0.50 - 1.35 mg/dL   Calcium 8.6  8.4 - 10.5 mg/dL   GFR calc non Af Amer 32 (*) >90 mL/min   GFR calc Af Amer 37 (*) >90 mL/min   Comment: (NOTE)     The eGFR has been calculated using the CKD EPI equation.     This calculation has not been validated in all clinical situations.     eGFR's persistently <90 mL/min signify possible Chronic Kidney     Disease.   Anion gap 14  5 - 15  GLUCOSE, CAPILLARY     Status: Abnormal   Collection Time    06/17/14  8:00 AM      Result Value Ref Range   Glucose-Capillary 104 (*) 70 - 99 mg/dL  GLUCOSE, CAPILLARY     Status: Abnormal   Collection Time    06/17/14 11:57 AM      Result Value Ref Range   Glucose-Capillary 160 (*) 70 - 99 mg/dL  GLUCOSE, CAPILLARY     Status: None   Collection Time    06/17/14  4:42 PM      Result Value Ref Range   Glucose-Capillary 98  70 - 99 mg/dL  GLUCOSE, CAPILLARY     Status: Abnormal   Collection Time    06/17/14  8:53 PM      Result  Value Ref Range   Glucose-Capillary 182 (*) 70 - 99 mg/dL  BASIC METABOLIC PANEL     Status: Abnormal   Collection Time    06/18/14  2:52 AM      Result Value Ref Range   Sodium 145  137 - 147 mEq/L   Potassium 3.4 (*) 3.7 - 5.3 mEq/L   Chloride 108  96 - 112 mEq/L   CO2 24  19 - 32 mEq/L   Glucose, Bld 105 (*) 70 - 99 mg/dL   BUN 28 (*) 6 - 23 mg/dL   Creatinine, Ser 1.80 (*) 0.50 - 1.35 mg/dL   Calcium 8.5  8.4 - 10.5 mg/dL   GFR calc non Af Amer 34 (*) >90 mL/min   GFR calc Af Amer 39 (*) >90 mL/min   Comment: (NOTE)     The eGFR has been calculated using the CKD EPI equation.     This calculation has not been validated in all clinical situations.     eGFR's persistently <90 mL/min signify possible Chronic Kidney     Disease.   Anion gap 13  5 - 15  MAGNESIUM     Status: None   Collection Time    06/18/14  2:52 AM      Result Value Ref Range   Magnesium 2.2  1.5 - 2.5 mg/dL  GLUCOSE, CAPILLARY     Status: Abnormal   Collection Time    06/18/14  8:14 AM      Result Value Ref Range   Glucose-Capillary 101 (*) 70 - 99 mg/dL  GLUCOSE, CAPILLARY     Status: Abnormal   Collection Time    06/18/14 11:51 AM      Result Value Ref Range   Glucose-Capillary 135 (*) 70 - 99 mg/dL    Imaging: Imaging results have been reviewed  Assessment/Plan:   Active Problems:   Type 2 diabetes mellitus with peripheral  neuropathy   CAD (coronary artery disease)   Long-term (current) use of anticoagulants   Unstable angina   Acute blood loss anemia  Time Spent Directly with Patient:  20 minutes  Length of Stay:  LOS: 10 days   1. NSTEMI- S/P LCX SVG PCI/Stent X 2 with DES. Plavix non responder now on Brilenta. Imdur started. No further chest pain.  2. Acute blood loss anemia- S/P Tx 1 Unit PRBCs with increase in HGB to 10. On PPI. Guaiacs have not been sent. He denies ongoing GI bleeding. Has been scoped by Dr. Carlean Purl before. Will ask [facility] to consult with management  recommendations.  3. Acute on Chronic RI- Received IV lasix last night with good diuresis. SCr decreased 2.2 to 1.8 which is at baseline.  4. Atrial fibrillation - this apparently was 4 years ago without reocurrence. May be too risky to be on ASA, Brillinta and Warfarin. Holding warfarin now - pending GI work-up.  Ok to transfer to telemetry floor today.  Pixie Casino, MD, [facility] Attending Cardiologist [facility]  HILTY,Kenneth C 06/18/2014, 1:40 PM

## 2014-06-19 ENCOUNTER — Other Ambulatory Visit: Payer: Self-pay | Admitting: Physician Assistant

## 2014-06-19 ENCOUNTER — Encounter (HOSPITAL_COMMUNITY): Payer: Medicare Other | Admitting: Anesthesiology

## 2014-06-19 ENCOUNTER — Encounter (HOSPITAL_COMMUNITY)
Admission: EM | Disposition: A | Discharge status: Home / Self Care | Payer: Self-pay | Source: Home / Self Care | Attending: Cardiology

## 2014-06-19 ENCOUNTER — Encounter (HOSPITAL_COMMUNITY): Payer: Self-pay

## 2014-06-19 ENCOUNTER — Inpatient Hospital Stay (HOSPITAL_COMMUNITY): Payer: Medicare Other | Admitting: Anesthesiology

## 2014-06-19 DIAGNOSIS — D649 Anemia, unspecified: Secondary | ICD-10-CM

## 2014-06-19 DIAGNOSIS — R195 Other fecal abnormalities: Secondary | ICD-10-CM

## 2014-06-19 DIAGNOSIS — I5032 Chronic diastolic (congestive) heart failure: Secondary | ICD-10-CM

## 2014-06-19 HISTORY — PX: ESOPHAGOGASTRODUODENOSCOPY: SHX5428

## 2014-06-19 LAB — BASIC METABOLIC PANEL
Anion gap: 12 (ref 5–15)
BUN: 25 mg/dL — ABNORMAL HIGH (ref 6–23)
CHLORIDE: 108 meq/L (ref 96–112)
CO2: 24 meq/L (ref 19–32)
Calcium: 8.8 mg/dL (ref 8.4–10.5)
Creatinine, Ser: 1.67 mg/dL — ABNORMAL HIGH (ref 0.50–1.35)
GFR calc Af Amer: 43 mL/min — ABNORMAL LOW (ref >90)
GFR, EST NON AFRICAN AMERICAN: 37 mL/min — AB (ref >90)
GLUCOSE: 93 mg/dL (ref 70–99)
POTASSIUM: 3.5 meq/L — AB (ref 3.7–5.3)
SODIUM: 144 meq/L (ref 137–147)

## 2014-06-19 LAB — GLUCOSE, CAPILLARY: Glucose-Capillary: 91 mg/dL (ref 70–99)

## 2014-06-19 LAB — CBC
HCT: 28.9 % — ABNORMAL LOW (ref 39.0–52.0)
HEMOGLOBIN: 9.3 g/dL — AB (ref 13.0–17.0)
MCH: 29.4 pg (ref 26.0–34.0)
MCHC: 32.2 g/dL (ref 30.0–36.0)
MCV: 91.5 fL (ref 78.0–100.0)
Platelets: 207 10*3/uL (ref 150–400)
RBC: 3.16 MIL/uL — ABNORMAL LOW (ref 4.22–5.81)
RDW: 15.2 % (ref 11.5–15.5)
WBC: 7.1 10*3/uL (ref 4.0–10.5)

## 2014-06-19 SURGERY — EGD (ESOPHAGOGASTRODUODENOSCOPY)
Anesthesia: Moderate Sedation

## 2014-06-19 SURGERY — EGD (ESOPHAGOGASTRODUODENOSCOPY)
Anesthesia: Monitor Anesthesia Care

## 2014-06-19 MED ORDER — PANTOPRAZOLE SODIUM 40 MG PO TBEC: 40.0000 mg | DELAYED_RELEASE_TABLET | Freq: Every day | ORAL | Status: DC

## 2014-06-19 MED ORDER — ISOSORBIDE MONONITRATE ER 30 MG PO TB24: 30.0000 mg | ORAL_TABLET | Freq: Every day | ORAL | Status: DC

## 2014-06-19 MED ORDER — ASPIRIN 81 MG PO TBEC: 81.0000 mg | DELAYED_RELEASE_TABLET | Freq: Every day | ORAL | Status: DC

## 2014-06-19 MED ORDER — TICAGRELOR 90 MG PO TABS: 90.0000 mg | ORAL_TABLET | Freq: Two times a day (BID) | ORAL | Status: DC

## 2014-06-19 MED ORDER — PANTOPRAZOLE SODIUM 40 MG PO TBEC
40.0000 mg | DELAYED_RELEASE_TABLET | Freq: Every day | ORAL | Status: DC
Start: 2014-06-19 — End: 2014-09-08

## 2014-06-19 MED ORDER — BUTAMBEN-TETRACAINE-BENZOCAINE 2-2-14 % EX AERO
INHALATION_SPRAY | CUTANEOUS | Status: DC | PRN
  Administered 2014-06-19: 2 via TOPICAL

## 2014-06-19 MED ORDER — POTASSIUM CHLORIDE CRYS ER 20 MEQ PO TBCR
40.0000 meq | EXTENDED_RELEASE_TABLET | Freq: Once | ORAL | Status: AC
  Administered 2014-06-19: 40 meq via ORAL
  Filled 2014-06-19: qty 2

## 2014-06-19 MED ORDER — LACTATED RINGERS IV SOLN
INTRAVENOUS | Status: DC
  Administered 2014-06-19: 1000 mL via INTRAVENOUS

## 2014-06-19 MED ORDER — PROPOFOL INFUSION 10 MG/ML OPTIME
INTRAVENOUS | Status: DC | PRN
  Administered 2014-06-19: 30 ug/kg/min via INTRAVENOUS

## 2014-06-19 MED ORDER — POTASSIUM CHLORIDE 20 MEQ PO PACK
40.0000 meq | PACK | Freq: Once | ORAL | Status: DC
  Filled 2014-06-19: qty 2

## 2014-06-19 MED ORDER — TICAGRELOR 90 MG PO TABS
90.0000 mg | ORAL_TABLET | Freq: Two times a day (BID) | ORAL | Status: DC
Start: 2014-06-19 — End: 2014-06-19

## 2014-06-19 MED ORDER — TICAGRELOR 90 MG PO TABS
90.0000 mg | ORAL_TABLET | Freq: Two times a day (BID) | ORAL | Status: DC
Start: 2014-06-19 — End: 2014-07-03

## 2014-06-19 NOTE — Discharge Summary (Signed)
Discharge Summary   Patient ID: [name],  MRN: [id number], DOB/AGE: 1932/01/18 80 y.o.  Admit date: 06/08/2014 Discharge date: 06/19/2014  Primary Care Provider: [name] Primary Cardiologist: Dr. [name] Discharge Diagnoses Principal Problem:   NSTEMI (non-ST elevated myocardial infarction) Active Problems:   Type 2 diabetes mellitus with peripheral neuropathy   Hyperlipidemia   Hypertensive heart disease   GERD   CAD (coronary artery disease)   Long-term (current) use of anticoagulants   Unstable angina   Acute blood loss anemia   Heme positive stool   Allergies No Known Allergies  Procedures  1. Cath #1 INDICATIONS: Unstable angina  PROCEDURE: 1. Left heart catheterization; 2. Coronary angiography; 3. Left ventriculography; 4. Bypass graft angiography including left internal mammary  LEFT VENTRICULOGRAM: Left ventricular angiogram was done in the 30 RAO projection and revealed mild mid anterior hypokinesis with an EF of 55%  IMPRESSIONS: 1. 95% stenosis in that degenerated saphenous vein graft to the obtuse marginal.  2. Patent saphenous vein graft to the right coronary. This graft has a 50% proximal stenosis.  3. The LAD is widely patent.  4. Native LAD is totally occluded, obtuse marginal is totally occluded, the proximal native circumflex contains 50-70% stenosis, and the mid RCA is totally occluded  5. LV function is globally normal with mild anterior hypokinesis  RECOMMENDATION: IV hydration over the next 48 hours  Loaded with oral Plavix.  Plan PCI of the degenerated saphenous vein graft on Friday morning assuming renal function is stable. Procedure to be performed either from the right radial and femoral approach since left radial guide support was not stable..  2. Cath #2 INDICATION: Unstable angina pectoris due to degenerated saphenous vein graft to the obtuse marginal  PROCEDURE: 1. DES to the SVG to circumflex.; 2. Failure of spider distal  protection device to cross the lesion  ANGIOGRAPHIC RESULTS: 99% mid saphenous vein graft obstruction reduced to 0% with TIMI grade 3 flow using a 4.0 drug-eluting stent. TIMI grade 3 flow at the end of the case was noted and there was good myocardial blush.  IMPRESSIONS: Successful DES implantation in the saphenous vein graft to the marginal with reduction in stenosis from 99% to 0% with TIMI grade 3 flow  RECOMMENDATION: Aspirin and Plavix for least a year and likely indefinitely  Patient is eligible for discharge in a.m. assuming no groin, renal or ischemic complication.   3. Echo LV EF: 60% - 65%  ------------------------------------------------------------------- Indications: CAD of native vessels 414.01.  ------------------------------------------------------------------- History: PMH: Right Bundle Branch Block. Atrial fibrillation. Atrial flutter. PMH: Myocardial infarction. Risk factors: Hypertension. Diabetes mellitus.  ------------------------------------------------------------------- Study Conclusions  - Left ventricle: The cavity size was normal. Wall thickness was increased in a pattern of mild LVH. Systolic function was normal. The estimated ejection fraction was in the range of 60% to 65%. Wall motion was normal; there were no regional wall motion abnormalities. Features are consistent with a pseudonormal left ventricular filling pattern, with concomitant abnormal relaxation and increased filling pressure (grade 2 diastolic dysfunction). E/medial e&' > 15 suggests end diastolic pressure at least 20 mmHg. - Aortic valve: Trileaflet; moderately calcified leaflets. Sclerosis without stenosis. - Mitral valve: Moderately calcified annulus. Mildly calcified leaflets . There was mild regurgitation. - Left atrium: The atrium was mildly dilated. - Right ventricle: The cavity size was normal. Systolic function was normal. - Right atrium: The atrium was mildly dilated. -  Tricuspid valve: Peak RV-RA gradient (S): 41 mm Hg. - Pulmonary arteries: PA  peak pressure: 49 mm Hg (S). - Systemic veins: IVC measured 2.0 cm with < 50% respirophasic variation, suggesting RA pressure 8 mmHg.  Impressions:  - Normal LV size with mild LV hypertrophy. EF 60-65%. Moderate diastolic dysfunction with evidence for elevated LV filling pressure. Normal RV size and systolic function. Mild to moderate pulmonary hypertension.   4. Cath #3 INDICATION: Prolonged chest pain after saphenous vein graft DES on 06/13/2014  PROCEDURE: Angioplasty and DES SVG to obtuse marginal  ANGIOGRAPHIC RESULTS: The hazy 60% stenosis proximal to the previously placed stent in the saphenous vein graft to the circumflex was stented slightly overlapping the previously placed stent. A 0% stenosis was noted and TIMI grade 3 flow was sustained after administration of intracoronary nitroglycerin and 200 mcg of intracoronary verapamil.  IMPRESSIONS: Prolonged chest pain intermittently over the past 12 hours since saphenous vein graft stenting on 06/13/14. Initial images demonstrated that the saphenous vein graft was widely patent. P2 Y12 was 266, which is suboptimal. A segmental 50-60% stenosis proximal to the previously placed stent was treated with DES to 4 mm in diameter. The graft is widely patent post procedure with TIMI grade 3 flow . I suspect this presentation is related to suboptimal antiplatelet therapy and transient thrombosis with distal embolization from the previously placed stent. Placing the new stent in the segmental 50-60% stenosis will improve blood flow and decrease the likelihood of further clotting.  RECOMMENDATION: Plavix is discontinued. Brilinta is started. Aggrastat will be used for 12 hours. IV Lasix was given for heart failure. Renal function may end up being compromised with the patient receiving contrast on 2 successive days. Aggressive diuresis will not help preserve renal function but is  necessary because of acute heart failure. His overall condition is guarded but improving post procedure.  If O2 sats are 90 or less he will be placed on [place]  The patient is an 80 year old male with past medical history of coronary artery disease status post CABG in 2006. He saw Dr. [name] in June 20 15 with complaint of worsening angina. Since that time, he has been having increasing episodes of chest discomfort. His chest discomfort often wake him up from sleep at night and has also been associated with exertion. Patient did not wish to use nitroglycerin due to his use of Viagra. When his family found out about his symptom, they brought him to the emergency room for evaluation on 06/08/2014. Patient has also been noticing some dark stool last week when his INR was elevated., He has been taking Coumadin chronically since his paroxysmal atrial fibrillation 4 years ago. Serial troponin was negative. Given his symptom of unstable angina, cardiac catheterization was scheduled on the following day. The patient's Coumadin was held while starting heparin gtt and serial CBC was obtained to monitor his hemoglobin. Patient's INR eventually fell below 1.7 on 06/11/2014 allowing [place] to do cardiac catheterization via femoral approach. Cardiac catheterization was performed by [name] on 7/29 which noted a 95% stenosis in degenerated SVG to OM/LCx, patent SVG to RCA with 50% proximal stenosis, totally occluded native LAD and OM, proximal 50-70% stenosis in the left circumflex artery. LVEF was 55% during cardiac catheterization. Patient was loaded with aspirin and Plavix. A staged cardiac catheterization was planned for the PCI of SVG to OM at later date given his renal insufficiency. Patient returned to the cath lab on 06/13/2014 and received a drug-eluting stent for 99% mid SVG to left circumflex. The plan was for  the patient to resume aspirin and Plavix for at least a year however likely  indefinitely.  Post cardiac catheterization, he developed recurrent chest pain on the following day. The symptom was relieved with nitroglycerin. Serial troponin was obtained which showed progressive elevation concerning for NSTEMI. Nitroglycerin drip was restarted. Troponin eventually peaked at 3.02 in the afternoon of 06/14/2014. He was moved to step down unit to continue on nitroglycerin glycerin and heparin. Echocardiogram was obtained on 8/1 which showed EF of 60-65%, mild LVH, no regional wall motion abnormality, grade 2 diastolic dysfunction, peak PA pressure 49. P2Y12 assay returned greater than 230 which was suboptimal. The decision was made for the patient to undergo repeat cardiac catheterization. Repeat cardiac catheterization showed a segmental 50-60% stenosis proximal to the previously placed stent, the presentation was likely related to suboptimal antiplatelet therapy and a transient thrombosis with distal embolization from previously placed stent. His Plavix was changed to [place]. Aggrastat was continued for 12 hours after cardiac catheterization. He was given a single dose IV Lasix to remove excess fluid. Post cardiac catheterization, he had some worsening of renal function which was likely related to diuresis. His hemoglobin continued to drop post cardiac catheterization. He was seen in the morning of 06/16/2014 when it was noted that his hemoglobin has steadily decreased from 11 to 7.8. He has a history of GI bleed in the past secondary to diverticulosis. The decision was made to continue to hold Coumadin due to the fact that patient had not had any recurrence of A. fib for the past 4 years. He was transfused one unit of packed red blood cell on 8/3. Gastroenterology team was consulted shortly after. Patient was seen by GI service on 06/18/2014. It was noted he had some diverticulosis and internal hemorrhoids during his last colonoscopy in 2009 by Dr. Carlean Purl. Colonoscopy was deferred given the  patient is currently on aspirin and Brillinta. He will need at least 6-8 month on aspirin and Brilinta before Brilinta can be safely discontinued for outpatient colonoscopy. Upper EGD was performed on 06/19/2014, no significant bleeding source was observed. Patient's hemoglobin has been stable after the transfusion. He is deemed stable for discharge from cardiology perspective.  I will schedule followup with Dr. [name]. He will also need a one-week BMET and CBC in the clinic as patient has to restart on his home dose of 80 mg daily of Lasix. CBC allows [place] to also monitor his hemoglobin change for the next week. After discussing with Dr Mare Ferrari, we have decided to discontinue coumadin for now. Risk and benefit of discontinuing coumadin has been explained to the patient, he understands that his current bleeding risk is higher than stroke risk and willing to come off of coumadin. Dr. [name] will decide later whether or not to resume coumadin. Of note, patient had some acute on chronic renal insufficiency due to overdiuresis during the hospitalization. It is important for [place] to continue to monitor his renal function after resuming home dose of Lasix. I have advised the patient that we added imdur to his medical regimen and he should not take Viagra at the same time as the combination can drop his blood pressure too much.  Discharge Vitals Blood pressure 110/63, pulse 64, temperature 97.9 F (36.6 C), temperature source Oral, resp. rate 20, height 5\' 9"  (1.753 m), weight 185 lb 9.6 oz (84.188 kg), SpO2 99.00%.  Filed Weights   06/14/14 0002 06/17/14 2312 06/19/14 0627  Weight: 192 lb 0.3 oz (87.1 kg) 187 lb 8  oz (85.049 kg) 185 lb 9.6 oz (84.188 kg)    Labs  CBC  Recent Labs  06/17/14 0006 06/19/14 1000  WBC 10.5 7.1  HGB 10.0* 9.3*  HCT 30.3* 28.9*  MCV 91.0 91.5  PLT 154 [id number]   Basic Metabolic Panel  Recent Labs  06/18/14 0252 06/19/14 0606  NA 145 144  K 3.4* 3.5*  CL 108 108  CO2 24 24   GLUCOSE 105* 93  BUN 28* 25*  CREATININE 1.80* 1.67*  CALCIUM 8.5 8.8  MG 2.2  --     Disposition  Pt is being discharged home today in good condition.  Follow-up Plans & Appointments      Follow-up Information   Follow up with Dola Argyle, MD On 07/16/2014. (9:00am)    Specialty:  Cardiology   Contact information:   [id number] [address] [phone]       [facility] On 06/26/2014. (check labs in 1 week. 10:30am)    Specialty:  Cardiology   Contact information:   [address] [place] [address] [phone]      Discharge Medications    Medication List    STOP taking these medications       sildenafil 100 MG tablet  Commonly known as:  VIAGRA     warfarin 7.5 MG tablet  Commonly known as:  COUMADIN      TAKE these medications       acetaminophen 500 MG tablet  Commonly known as:  TYLENOL  Take 1,000 mg by mouth daily. For pain     amLODipine 10 MG tablet  Commonly known as:  NORVASC  Take 10 mg by mouth every evening.     aspirin 81 MG EC tablet  Take 1 tablet (81 mg total) by mouth daily.     colchicine 0.6 MG tablet  Take 0.6 mg by mouth 3 (three) times a week. For gout flare ups     fish oil-omega-3 fatty acids 1000 MG capsule  Take 2 g by mouth 2 (two) times daily.     furosemide 80 MG tablet  Commonly known as:  LASIX  Take 80 mg by mouth daily. Except Saturdays and Sundays     INSULIN SYRINGE .3CC/31GX5/16" 31G X 5/16" 0.3 ML Misc  Use daily as instructed.     isosorbide mononitrate 30 MG 24 hr tablet  Commonly known as:  IMDUR  Take 1 tablet (30 mg total) by mouth daily.     K-99 595 MG Caps  Generic drug:  Potassium Gluconate  Take 595 mg by mouth every evening.     LANTUS 100 UNIT/ML injection  Generic drug:  insulin glargine  Inject 25-30 Units into the skin daily. As instructed by MD     magnesium oxide 400 MG tablet  Commonly known  as:  MAG-OX  Take 400 mg by mouth daily.     metoprolol succinate 50 MG 24 hr tablet  Commonly known as:  TOPROL-XL  Take 1 tablet (50 mg total) by mouth daily. Take with or immediately following a meal.     multivitamin with minerals Tabs tablet  Take 1 tablet by mouth daily.     pantoprazole 40 MG tablet  Commonly known as:  PROTONIX  Take 1 tablet (40 mg total) by mouth daily.     rosuvastatin 20 MG tablet  Commonly known as:  CRESTOR  Take 20 mg by mouth  daily.     ticagrelor 90 MG Tabs tablet  Commonly known as:  BRILINTA  Take 1 tablet (90 mg total) by mouth 2 (two) times daily.     traMADol 50 MG tablet  Commonly known as:  ULTRAM  Take 1 tablet (50 mg total) by mouth every 6 (six) hours as needed.     Vitamin D 1000 UNITS capsule  Take 2,000 Units by mouth daily.        Outstanding Labs/Studies  BMET and [place] in the clinic in 1 week  Duration of Discharge Encounter   Greater than 30 minutes including physician time.  Signed, Almyra Deforest PA-C 06/19/2014, 3:14 PM

## 2014-06-19 NOTE — Progress Notes (Signed)
CARDIAC REHAB PHASE I   PRE:  Rate/Rhythm: 76 SR  BP:  Supine:   Sitting: 158/70  Standing:    SaO2:   MODE:  Ambulation: 550 ft   POST:  Rate/Rhythm: 88  BP:  Supine:   Sitting: 160/60  Standing:    SaO2: 98%RA 1015-1045 Pt walked 550 ft with rolling walker with steady gait. No CP. Reviewed brilinta use and gave diabetic diet. Discussed carb counting. Pt stated he keeps HGBA1C below 7. Hopes to go home today.   Graylon Good, RN [name]  06/19/2014 10:42 AM

## 2014-06-19 NOTE — Op Note (Signed)
[facility] [place], [address]   ENDOSCOPY PROCEDURE REPORT  PATIENT: Kaisen, Huesman  MR#: [id number] BIRTHDATE: April 21, 1932 , 82  yrs. old GENDER: Male ENDOSCOPIST: Jerene Bears, MD REFERRED BY:  Lyman Bishop, M.D. PROCEDURE DATE:  06/19/2014 PROCEDURE:  EGD, diagnostic ASA CLASS:     Class III INDICATIONS:  Anemia.   Heme positive stool. MEDICATIONS: MAC sedation, administered by CRNA and See Anesthesia Report. TOPICAL ANESTHETIC: none  DESCRIPTION OF PROCEDURE: After the risks benefits and alternatives of the procedure were thoroughly explained, informed consent was obtained.  The Pentax Gastroscope [id number] endoscope was introduced through the mouth and advanced to the second portion of the duodenum. Without limitations.  The instrument was slowly withdrawn as the mucosa was fully examined.      The upper, middle and distal third of the esophagus were carefully inspected and no abnormalities were noted.  The z-line was well seen at the GEJ.  The endoscope was pushed into the fundus which was normal including a retroflexed view.  The antrum, gastric body, first and second part of the duodenum were unremarkable. Retroflexed views revealed no abnormalities.     The scope was then withdrawn from the patient and the procedure completed.  COMPLICATIONS: There were no complications.  ENDOSCOPIC IMPRESSION: Normal EGD  RECOMMENDATIONS: 1.  No evidence of upper GI bleeding source.  Would continue with current anticoagulation 2.  Would recommend repeat colonoscopy when Brilinta can safely be held prior to the procedure (per cardiology)  eSigned:  Jerene Bears, MD 06/19/2014 12:22 PM   CC:The Patient

## 2014-06-19 NOTE — Progress Notes (Signed)
                                                                      Daily Rounding Note  06/19/2014, 9:50 AM  LOS: 11 days   SUBJECTIVE:       Per RN, no issues overnight.  Walked in hall this AM  OBJECTIVE:         Vital signs in last 24 hours:    Temp:  [98.4 F (36.9 C)-98.8 F (37.1 C)] 98.4 F (36.9 C) (08/06 0627) Pulse Rate:  [70-78] 70 (08/06 0627) Resp:  [13-18] 18 (08/06 0627) BP: (116-142)/(57-63) 116/59 mmHg (08/06 0627) SpO2:  [94 %-98 %] 97 % (08/06 0627) Weight:  [84.188 kg (185 lb 9.6 oz)] 84.188 kg (185 lb 9.6 oz) (08/06 0627) Last BM Date: 06/18/14 Sleeping currently, did not arouse for exam.    Intake/Output from previous day: 08/05 0701 - 08/06 0700 In: 440 [P.O.:440] Out: 425 [Urine:425]  Intake/Output this shift:    Lab Results:  Recent Labs  06/17/14 0006  WBC 10.5  HGB 10.0*  HCT 30.3*  PLT 154   BMET  Recent Labs  06/17/14 0006 06/18/14 0252 06/19/14 0606  NA 142 145 144  K 3.5* 3.4* 3.5*  CL 106 108 108  CO2 22 24 24   GLUCOSE 161* 105* 93  BUN 30* 28* 25*  CREATININE 1.88* 1.80* 1.67*  CALCIUM 8.6 8.5 8.8     ASSESMENT:   * CAD. DES 7/31. Non STEMI and instent restenosis with 8/1 cath and DES. Considered Plavix non-responder so now on Brilinta and low dose ASA.  * PAF. On Coumadin PTA. ? Restart this.  * FOBT + stool and anemia.  Anemia likely multifactorial with all the recent vascular interventions/caths,  * Slight isolated thrombocytopenia 8/3, resolved. *  ARF, improving.  *  Hypokalemia, mild. Further oral supplements ordered for this AM.     PLAN   *  EGD this afternoon.     Azucena Freed  06/19/2014, 9:50 AM Pager: [phone]

## 2014-06-19 NOTE — Transfer of Care (Signed)
Immediate Anesthesia Transfer of Care Note  Patient: [name]  Procedure(s) Performed: Procedure(s): ESOPHAGOGASTRODUODENOSCOPY (EGD) (N/A)  Patient Location: Endoscopy Unit  Anesthesia Type:MAC  Level of Consciousness: awake, alert , oriented and patient cooperative  Airway & Oxygen Therapy: Patient Spontanous Breathing and Patient connected to nasal cannula oxygen  Post-op Assessment: Report given to PACU RN and Post -op Vital signs reviewed and stable  Post vital signs: Reviewed and stable  Complications: No apparent anesthesia complications

## 2014-06-19 NOTE — Progress Notes (Signed)
Patient seen, examined, and I agree with the above documentation, including the assessment and plan. EGD today The nature of the procedure, as well as the risks, benefits, and alternatives were carefully and thoroughly reviewed with the patient. Ample time for discussion and questions allowed. The patient understood, was satisfied, and agreed to proceed.

## 2014-06-19 NOTE — Anesthesia Preprocedure Evaluation (Addendum)
Anesthesia Evaluation  Patient identified by MRN, date of birth, ID band  Reviewed: Allergy & Precautions, H&P , NPO status , reviewed documented beta blocker date and time   Airway Mallampati: II  Neck ROM: Full    Dental  (+) Teeth Intact   Pulmonary former smoker,  breath sounds clear to auscultation        Cardiovascular hypertension, + angina + CAD, + Cardiac Stents and + CABG + dysrhythmias Rate:Normal     Neuro/Psych    GI/Hepatic GERD-  ,  Endo/Other  diabetes  Renal/GU      Musculoskeletal   Abdominal   Peds  Hematology  (+) anemia ,   Anesthesia Other Findings   Reproductive/Obstetrics                          Anesthesia Physical Anesthesia Plan  ASA: III  Anesthesia Plan: MAC   Post-op Pain Management:    Induction: Intravenous  Airway Management Planned: Natural Airway and Nasal Cannula  Additional Equipment:   Intra-op Plan:   Post-operative Plan:   Informed Consent: I have reviewed the patients History and Physical, chart, labs and discussed the procedure including the risks, benefits and alternatives for the proposed anesthesia with the patient or authorized representative who has indicated his/her understanding and acceptance.     Plan Discussed with: CRNA and Surgeon  Anesthesia Plan Comments:         Anesthesia Quick Evaluation

## 2014-06-19 NOTE — Progress Notes (Signed)
Patient Name: [name] Date of Encounter: 06/19/2014     Active Problems:   Type 2 diabetes mellitus with peripheral neuropathy   CAD (coronary artery disease)   Long-term (current) use of anticoagulants   Unstable angina   Acute blood loss anemia    SUBJECTIVE  Denies any CP or SOB last night. Feels good this morning. Last bowel movement had no frank blood.   CURRENT MEDS . acetaminophen  1,000 mg Oral Daily  . amLODipine  10 mg Oral QPM  . aspirin EC  81 mg Oral Daily  . atorvastatin  80 mg Oral q1800  . colchicine  0.6 mg Oral Once per day on Mon Wed Fri  . insulin aspart  0-15 Units Subcutaneous TID WC  . insulin glargine  25 Units Subcutaneous QHS  . isosorbide mononitrate  30 mg Oral Daily  . metoprolol succinate  50 mg Oral Daily  . multivitamin with minerals  1 tablet Oral Daily  . pantoprazole  40 mg Oral QHS  . ticagrelor  90 mg Oral BID    OBJECTIVE  Filed Vitals:   06/18/14 1304 06/18/14 1601 06/18/14 2028 06/19/14 0627  BP: 128/59 134/63 128/59 116/59  Pulse:  78 73 70  Temp:  98.8 F (37.1 C) 98.7 F (37.1 C) 98.4 F (36.9 C)  TempSrc:  Oral Oral Oral  Resp: 13 18 18 18   Height:      Weight:    185 lb 9.6 oz (84.188 kg)  SpO2:  96% 98% 97%    Intake/Output Summary (Last 24 hours) at 06/19/14 0823 Last data filed at 06/19/14 0604  Gross per 24 hour  Intake    240 ml  Output    425 ml  Net   -185 ml   Filed Weights   06/14/14 0002 06/17/14 2312 06/19/14 0627  Weight: 192 lb 0.3 oz (87.1 kg) 187 lb 8 oz (85.049 kg) 185 lb 9.6 oz (84.188 kg)    PHYSICAL EXAM  General: Pleasant, NAD. Neuro: Alert and oriented X 3. Moves all extremities spontaneously. Psych: Normal affect. HEENT:  Normal  Neck: Supple without bruits or JVD. Lungs:  Resp regular and unlabored, CTA. Heart: RRR no s3, s4. 1/6 systolic murmur Abdomen: Soft, non-tender, non-distended, BS + x 4.  Extremities: No clubbing, cyanosis or edema. DP/PT/Radials 2+ and equal  bilaterally.  Accessory Clinical Findings  CBC  Recent Labs  06/16/14 0844 06/17/14 0006  WBC 9.2 10.5  HGB 7.9* 10.0*  HCT 24.3* 30.3*  MCV 90.7 91.0  PLT 148* 123456   Basic Metabolic Panel  Recent Labs  06/18/14 0252 06/19/14 0606  NA 145 144  K 3.4* 3.5*  CL 108 108  CO2 24 24  GLUCOSE 105* 93  BUN 28* 25*  CREATININE 1.80* 1.67*  CALCIUM 8.5 8.8  MG 2.2  --     TELE  NSR with HR 70-80s, no significant ventricular ectopy. 2 episode of fast rhythm yesterday morning at 10am and 11 am  ECG  None today  Radiology/Studies  Dg Chest 2 View  06/15/2014   CLINICAL DATA:  Fever, cough, recent heart catheterization  EXAM: CHEST  2 VIEW  COMPARISON:  06/14/2014  FINDINGS: Mild patchy perihilar opacities, most prominently in the right upper lobe, suspicious for pneumonia or asymmetric edema. Suspected trace pleural effusions. No pneumothorax.  The heart is normal in size. Postsurgical changes related to prior CABG.  Degenerative changes of the visualized thoracolumbar spine.  IMPRESSION: Patchy perihilar opacities,  suspicious for pneumonia or asymmetric edema.   Electronically Signed   By: Julian Hy M.D.   On: 06/15/2014 07:30   Dg Chest Port 1 View  06/14/2014   CLINICAL DATA:  Increasing chest pain with shortness of Breath  EXAM: PORTABLE CHEST - 1 VIEW  COMPARISON:  06/08/2014  FINDINGS: The cardiac shadow is stable. Postoperative changes are again noted. The left lung remains clear. Vascular congestion and diffuse increased density in the right mid lung is noted. Although this may represent a component of pulmonary edema the possibility of an underlying pulmonic infiltrate could not be totally excluded.  IMPRESSION: Findings consistent with vascular congestion and likely unilateral pulmonary edema.   Electronically Signed   By: Inez Catalina M.D.   On: 06/14/2014 15:50   Dg Chest Port 1 View  06/08/2014   CLINICAL DATA:  Chest pain.  EXAM: PORTABLE CHEST - 1 VIEW   COMPARISON:  01/01/2013  FINDINGS: Heart size and pulmonary vascularity are normal. Lungs are clear except for minimal scarring at the left base laterally. No effusions. No acute osseous abnormality. CABG.  IMPRESSION: No acute abnormalities.   Electronically Signed   By: Rozetta Nunnery M.D.   On: 06/08/2014 18:55    ASSESSMENT AND PLAN  1. NSTEMI  - Cath 7/29 95% stenosis of degenerated SVG to OM, prox LCx 50-70% stenosis. 50% prox SVG to RCA, totally occluded LAD, EF55%  - Cath 7/31 DES to SVG to LCx, on ASA and Plavix   - Cath 8/1 due to recurrent CP and P2Y12 266, DES to 50-60% prox LCx stenosis, switched plavix to brillinta. Failed plavix therapy   2. Remote h/o PAF  - no recurrence, coumadin stopped given high bleeding risk during this admission  3. Acute on chronic renal insufficiency  - renal function continue to improve  4. Acute blood loss anemia  - anemia requiring 1 PRBC transfusion, GI consulted, however recommend outpt colonoscopy until able to come off Brillinta, EDG today   - Recheck CBC today  5. Hypokalemia  - replete   Disposition: if hgb stable, and EGD normal, plan for discharge this pm or tomorrow am. Will discuss with Dr. Mare Ferrari, potentially NO home coumadin given his risk of bleeding   Signed, Almyra Deforest PA-C Pager: [phone] Agree with above assessment and plan.  Patient states his last atrial fibrillation was about 4 years ago.  No recurrence since cardioversion then. He can go home without coumadin.  He will be on DAPT with ASA and Brilinta. After discharge he will follow up with Dr. [name] who can consider restarting coumadin vs continuing to go with just DAPT for his more pressing problem of CAD with recent DES stents.Okay for discharge today if EGD okay and Hgb stable.

## 2014-06-19 NOTE — Anesthesia Postprocedure Evaluation (Signed)
  Anesthesia Post-op Note  Patient: [name]  Procedure(s) Performed: Procedure(s): ESOPHAGOGASTRODUODENOSCOPY (EGD) (N/A)  Patient Location: Endoscopy Unit  Anesthesia Type:MAC  Level of Consciousness: awake, alert , oriented and patient cooperative  Airway and Oxygen Therapy: Patient Spontanous Breathing and Patient connected to nasal cannula oxygen  Post-op Pain: none  Post-op Assessment: Post-op Vital signs reviewed, Patient's Cardiovascular Status Stable, Respiratory Function Stable, Patent Airway and No signs of Nausea or vomiting  Post-op Vital Signs: Reviewed and stable  Last Vitals:  Filed Vitals:   06/19/14 1104  BP: 140/63  Pulse: 72  Temp: 36.8 C  Resp: 20    Complications: No apparent anesthesia complications

## 2014-06-19 NOTE — Discharge Instructions (Signed)
No driving for 24 hours. No lifting over 5 lbs for 1 week. No sexual activity for 1 week. Keep procedure site clean & dry. If you notice increased pain, swelling, bleeding or pus, call/return!  You may shower, but no soaking baths/hot tubs/pools for 1 week.   Do NOT take Viagra with Imdur on the same day, combination of both can cause significant drop in blood pressure    Anemia, Nonspecific Anemia is a condition in which the concentration of red blood cells or hemoglobin in the blood is below normal. Hemoglobin is a substance in red blood cells that carries oxygen to the tissues of the body. Anemia results in not enough oxygen reaching these tissues.  CAUSES  Common causes of anemia include:   Excessive bleeding. Bleeding may be internal or external. This includes excessive bleeding from periods (in women) or from the intestine.   Poor nutrition.   Chronic kidney, thyroid, and liver disease.  Bone marrow disorders that decrease red blood cell production.  Cancer and treatments for cancer.  HIV, AIDS, and their treatments.  Spleen problems that increase red blood cell destruction.  Blood disorders.  Excess destruction of red blood cells due to infection, medicines, and autoimmune disorders. SIGNS AND SYMPTOMS   Minor weakness.   Dizziness.   Headache.  Palpitations.   Shortness of breath, especially with exercise.   Paleness.  Cold sensitivity.  Indigestion.  Nausea.  Difficulty sleeping.  Difficulty concentrating. Symptoms may occur suddenly or they may develop slowly.  DIAGNOSIS  Additional blood tests are often needed. These help your health care provider determine the best treatment. Your health care provider will check your stool for blood and look for other causes of blood loss.  TREATMENT  Treatment varies depending on the cause of the anemia. Treatment can include:   Supplements of iron, vitamin 123456, or folic acid.   Hormone medicines.   A  blood transfusion. This may be needed if blood loss is severe.   Hospitalization. This may be needed if there is significant continual blood loss.   Dietary changes.  Spleen removal. HOME CARE INSTRUCTIONS Keep all follow-up appointments. It often takes many weeks to correct anemia, and having your health care provider check on your condition and your response to treatment is very important. SEEK IMMEDIATE MEDICAL CARE IF:   You develop extreme weakness, shortness of breath, or chest pain.   You become dizzy or have trouble concentrating.  You develop heavy vaginal bleeding.   You develop a rash.   You have bloody or black, tarry stools.   You faint.   You vomit up blood.   You vomit repeatedly.   You have abdominal pain.  You have a fever or persistent symptoms for more than 2-3 days.   You have a fever and your symptoms suddenly get worse.   You are dehydrated.  MAKE SURE YOU:  Understand these instructions.  Will watch your condition.  Will get help right away if you are not doing well or get worse. Document Released: 12/08/2004 Document Revised: 07/03/2013 Document Reviewed: 04/26/2013 [facility] Patient Information 2015 [place], [place]. This information is not intended to replace advice given to you by your health care provider. Make sure you discuss any questions you have with your health care provider.  Acute Coronary Syndrome Acute coronary syndrome (ACS) is an urgent problem in which the blood and oxygen supply to the heart is critically deficient. ACS requires hospitalization because one or more coronary arteries may be blocked.  ACS represents a range of conditions including:  Previous angina that is now unstable, lasts longer, happens at rest, or is more intense.  A heart attack, with heart muscle cell injury and death. There are three vital coronary arteries that supply the heart muscle with blood and oxygen so that it can pump blood  effectively. If blockages to these arteries develop, blood flow to the heart muscle is reduced. If the heart does not get enough blood, angina may occur as the first warning sign. SYMPTOMS   The most common signs of angina include:  Tightness or squeezing in the chest.  Feeling of heaviness on the chest.  Discomfort in the arms, neck, back, or jaw.  Shortness of breath and nausea.  Cold, wet skin.  Angina is usually brought on by physical effort or excitement which increase the oxygen needs of the heart. These states increase the blood flow needs of the heart beyond what can be delivered.  Other symptoms that are not as common include:  Fatigue  Unexplained feelings of nervousness or anxiety  Weakness  Diarrhea  Sometimes, you may not have noticed any symptoms at all but still suffered a cardiac injury. TREATMENT   Medicines to help discomfort may include nitroglycerin (nitro) in the form of tablets or a spray for rapid relief, or longer-acting forms such as cream, patches, or capsules. (Be aware that there are many side effects and possible interactions with other drugs).  Other medicines may be used to help the heart pump better.  Procedures to open blocked arteries including angioplasty or stent placement to keep the arteries open.  Open heart surgery may be needed when there are many blockages or they are in critical locations that are best treated with surgery. HOME CARE INSTRUCTIONS   Do not use any tobacco products including cigarettes, chewing tobacco, or electronic cigarettes.  Take one baby or adult aspirin daily, if your health care provider advises. This helps reduce the risk of a heart attack.  It is very important that you follow the angina treatment prescribed by your health care provider. Make arrangements for proper follow-up care.  Eat a heart healthy diet with salt and fat restrictions as advised.  Regular exercise is good for you as long as it does  not cause discomfort. Do not begin any new type of exercise until you check with your health care provider.  If you are overweight, you should lose weight.  Try to maintain normal blood lipid levels.  Keep your blood pressure under control as recommended by your health care provider.  You should tell your health care provider right away about any increase in the severity or frequency of your chest discomfort or angina attacks. When you have angina, you should stop what you are doing and sit down. This may bring relief in 3 to 5 minutes. If your health care provider has prescribed nitro, take it as directed.  If your health care provider has given you a follow-up appointment, it is very important to keep that appointment. Not keeping the appointment could result in a chronic or permanent injury, pain, and disability. If there is any problem keeping the appointment, you must call back to this facility for assistance. SEEK IMMEDIATE MEDICAL CARE IF:   You develop nausea, vomiting, or shortness of breath.  You feel faint, lightheaded, or pass out.  Your chest discomfort gets worse.  You are sweating or experience sudden profound fatigue.  You do not get relief of your chest pain  after 3 doses of nitro.  Your discomfort lasts longer than 15 minutes. MAKE SURE YOU:   Understand these instructions.  Will watch your condition.  Will get help right away if you are not doing well or get worse.  Take all medicines as directed by your health care provider. Document Released: 10/31/2005 Document Revised: 11/05/2013 Document Reviewed: 03/04/2014 [facility] Patient Information 2015 [place], [place]. This information is not intended to replace advice given to you by your health care provider. Make sure you discuss any questions you have with your health care provider.

## 2014-06-19 NOTE — Progress Notes (Signed)
Discharge instructions reviewed with patient. All questions answered at this time. RXs given to patient.  Ave Filter, RN

## 2014-06-20 ENCOUNTER — Encounter (HOSPITAL_COMMUNITY): Payer: Self-pay | Admitting: Internal Medicine

## 2014-06-23 LAB — CULTURE, BLOOD (ROUTINE X 2)
CULTURE: NO GROWTH
CULTURE: NO GROWTH

## 2014-06-26 ENCOUNTER — Other Ambulatory Visit (INDEPENDENT_AMBULATORY_CARE_PROVIDER_SITE_OTHER): Payer: Medicare Other

## 2014-06-26 ENCOUNTER — Ambulatory Visit: Payer: Self-pay | Admitting: Family Medicine

## 2014-06-26 ENCOUNTER — Telehealth: Payer: Self-pay | Admitting: Family Medicine

## 2014-06-26 DIAGNOSIS — I5032 Chronic diastolic (congestive) heart failure: Secondary | ICD-10-CM | POA: Diagnosis not present

## 2014-06-26 DIAGNOSIS — D649 Anemia, unspecified: Secondary | ICD-10-CM

## 2014-06-26 LAB — BASIC METABOLIC PANEL
BUN: 31 mg/dL — AB (ref 6–23)
CO2: 32 mEq/L (ref 19–32)
Calcium: 10.1 mg/dL (ref 8.4–10.5)
Chloride: 97 mEq/L (ref 96–112)
Creatinine, Ser: 2.1 mg/dL — ABNORMAL HIGH (ref 0.4–1.5)
GFR: 31.91 mL/min — AB (ref >60.00)
Glucose, Bld: 148 mg/dL — ABNORMAL HIGH (ref 70–99)
POTASSIUM: 3.8 meq/L (ref 3.5–5.1)
Sodium: 137 mEq/L (ref 135–145)

## 2014-06-26 LAB — CBC
HEMATOCRIT: 33.5 % — AB (ref 39.0–52.0)
HEMOGLOBIN: 11 g/dL — AB (ref 13.0–17.0)
MCHC: 32.8 g/dL (ref 30.0–36.0)
MCV: 88.7 fl (ref 78.0–100.0)
Platelets: 321 10*3/uL (ref 150.0–400.0)
RBC: 3.78 Mil/uL — ABNORMAL LOW (ref 4.22–5.81)
RDW: 15.4 % (ref 11.5–15.5)
WBC: 7 10*3/uL (ref 4.0–10.5)

## 2014-06-26 NOTE — Telephone Encounter (Signed)
FYI i called to confirm pt wanted wanted to cancel his pt/inr appointment per phone tree.  Pt stated he was in hospital and Dr Gwenlyn Found took him off his meds

## 2014-06-30 ENCOUNTER — Ambulatory Visit: Payer: Medicare Other

## 2014-07-03 ENCOUNTER — Telehealth: Payer: Self-pay

## 2014-07-03 ENCOUNTER — Other Ambulatory Visit: Payer: Self-pay

## 2014-07-03 ENCOUNTER — Ambulatory Visit (INDEPENDENT_AMBULATORY_CARE_PROVIDER_SITE_OTHER): Payer: Medicare Other | Admitting: *Deleted

## 2014-07-03 DIAGNOSIS — I4891 Unspecified atrial fibrillation: Secondary | ICD-10-CM | POA: Diagnosis not present

## 2014-07-03 DIAGNOSIS — E785 Hyperlipidemia, unspecified: Secondary | ICD-10-CM | POA: Diagnosis not present

## 2014-07-03 DIAGNOSIS — I482 Chronic atrial fibrillation, unspecified: Secondary | ICD-10-CM

## 2014-07-03 DIAGNOSIS — D649 Anemia, unspecified: Secondary | ICD-10-CM

## 2014-07-03 DIAGNOSIS — I119 Hypertensive heart disease without heart failure: Secondary | ICD-10-CM

## 2014-07-03 LAB — BASIC METABOLIC PANEL
BUN: 26 mg/dL — ABNORMAL HIGH (ref 6–23)
CALCIUM: 9.3 mg/dL (ref 8.4–10.5)
CO2: 30 mEq/L (ref 19–32)
CREATININE: 2 mg/dL — AB (ref 0.4–1.5)
Chloride: 101 mEq/L (ref 96–112)
GFR: 33.92 mL/min — ABNORMAL LOW (ref >60.00)
Glucose, Bld: 179 mg/dL — ABNORMAL HIGH (ref 70–99)
Potassium: 3.2 mEq/L — ABNORMAL LOW (ref 3.5–5.1)
Sodium: 139 mEq/L (ref 135–145)

## 2014-07-03 MED ORDER — TICAGRELOR 90 MG PO TABS
90.0000 mg | ORAL_TABLET | Freq: Two times a day (BID) | ORAL | Status: DC
Start: 2014-07-03 — End: 2014-09-22

## 2014-07-03 MED ORDER — ISOSORBIDE MONONITRATE ER 30 MG PO TB24
30.0000 mg | ORAL_TABLET | Freq: Every day | ORAL | Status: DC
Start: 2014-07-03 — End: 2014-07-09

## 2014-07-03 MED ORDER — NITROGLYCERIN 0.4 MG SL SUBL: 0.4000 mg | SUBLINGUAL_TABLET | SUBLINGUAL | Status: DC | PRN

## 2014-07-03 NOTE — Telephone Encounter (Signed)
The pt states that he has been doing well and that he has not had any CP since his hosp d/c on 8/6 but that he is weak. He agrees to come to the office today for a BMET.  Also, the pt states that he was told while in the hosp that he would have an RX for NTG when he left the hosp. He states they did not provide him with a prescription. Per Dr [name] I am sending an RX for NTG 0.4 mg to be taken as needed for CP to the pts pharmacy ([facility]) and the pt is aware.

## 2014-07-03 NOTE — Telephone Encounter (Signed)
Jeremiah Bjornstad, MD Patrica Via, LPN            Please call the patient to see how he is doing. Also, please arrange for him to have followup BMet later today or tomorrow. We need is to followup his labs from August 13.      Previous Messages      ----- Message -----  From: Almyra Deforest, PA  Sent: 06/30/2014 2:43 PM  To: Jeremiah Bjornstad, MD  Subject: Worsening Cr   Hi:  Dr. [name]. Patient was recently discharged after having 3 cath and 2 DES to SVG to OM. His lasix was restarted on discharged. 1 week BMET however shows Cr increased from 1.6 to 2.1. His next followup with you is on Sept 2nd. Do you want him to obtain another BMET prior to followup?  Thanks  [employer] PA-C

## 2014-07-09 ENCOUNTER — Other Ambulatory Visit: Payer: Self-pay

## 2014-07-09 MED ORDER — ISOSORBIDE MONONITRATE ER 30 MG PO TB24: 30.0000 mg | ORAL_TABLET | Freq: Every day | ORAL | Status: DC

## 2014-07-11 DIAGNOSIS — N183 Chronic kidney disease, stage 3 unspecified: Secondary | ICD-10-CM | POA: Diagnosis not present

## 2014-07-11 DIAGNOSIS — M948X9 Other specified disorders of cartilage, unspecified sites: Secondary | ICD-10-CM | POA: Diagnosis not present

## 2014-07-11 DIAGNOSIS — D631 Anemia in chronic kidney disease: Secondary | ICD-10-CM | POA: Diagnosis not present

## 2014-07-15 ENCOUNTER — Encounter: Payer: Self-pay | Admitting: Cardiology

## 2014-07-15 DIAGNOSIS — R9430 Abnormal result of cardiovascular function study, unspecified: Secondary | ICD-10-CM | POA: Insufficient documentation

## 2014-07-16 ENCOUNTER — Ambulatory Visit (INDEPENDENT_AMBULATORY_CARE_PROVIDER_SITE_OTHER): Payer: Medicare Other | Admitting: Cardiology

## 2014-07-16 ENCOUNTER — Other Ambulatory Visit: Payer: Medicare Other

## 2014-07-16 ENCOUNTER — Encounter: Payer: Self-pay | Admitting: Cardiology

## 2014-07-16 VITALS — BP 130/62 | HR 61 | Ht 69.0 in | Wt 188.8 lb

## 2014-07-16 DIAGNOSIS — Z8719 Personal history of other diseases of the digestive system: Secondary | ICD-10-CM

## 2014-07-16 DIAGNOSIS — N183 Chronic kidney disease, stage 3 unspecified: Secondary | ICD-10-CM

## 2014-07-16 DIAGNOSIS — R0989 Other specified symptoms and signs involving the circulatory and respiratory systems: Secondary | ICD-10-CM

## 2014-07-16 DIAGNOSIS — I200 Unstable angina: Secondary | ICD-10-CM

## 2014-07-16 DIAGNOSIS — I25810 Atherosclerosis of coronary artery bypass graft(s) without angina pectoris: Secondary | ICD-10-CM

## 2014-07-16 DIAGNOSIS — I340 Nonrheumatic mitral (valve) insufficiency: Secondary | ICD-10-CM

## 2014-07-16 DIAGNOSIS — I4891 Unspecified atrial fibrillation: Secondary | ICD-10-CM | POA: Diagnosis not present

## 2014-07-16 DIAGNOSIS — I779 Disorder of arteries and arterioles, unspecified: Secondary | ICD-10-CM

## 2014-07-16 DIAGNOSIS — I059 Rheumatic mitral valve disease, unspecified: Secondary | ICD-10-CM

## 2014-07-16 DIAGNOSIS — I739 Peripheral vascular disease, unspecified: Secondary | ICD-10-CM

## 2014-07-16 DIAGNOSIS — E785 Hyperlipidemia, unspecified: Secondary | ICD-10-CM

## 2014-07-16 DIAGNOSIS — R9430 Abnormal result of cardiovascular function study, unspecified: Secondary | ICD-10-CM

## 2014-07-16 DIAGNOSIS — I480 Paroxysmal atrial fibrillation: Secondary | ICD-10-CM

## 2014-07-16 NOTE — Assessment & Plan Note (Signed)
Historically we have use Coumadin for his paroxysmal atrial fibrillation. However he cannot be on Coumadin now because the risk is too great. He is on potent dual antiplatelet therapy. Over time will decide when anticoagulation can be resumed.

## 2014-07-16 NOTE — Assessment & Plan Note (Signed)
His carotid disease is being followed very carefully by Doppler.

## 2014-07-16 NOTE — Patient Instructions (Addendum)
Your physician recommends that you continue on your current medications as directed. Please refer to the Current Medication list given to you today.  Your physician recommends that you return for lab work in: today   Your physician recommends that you schedule a follow-up appointment in: 3 months.

## 2014-07-16 NOTE — Progress Notes (Signed)
Patient ID: [name], male   DOB: 03/20/1932, 80 y.o.   MRN: [id number]    HPI  Patient is seen back today to followup coronary artery disease and paroxysmal atrial fibrillation. I had seen him last in the office April 30, 2014. After that time he had increasing chest discomfort and he was admitted with a non-STEMI for further treatment. His renal dysfunction plays a significant role. Catheterization showed that he needed to have a stent placed. He was hydrated carefully and the stent was placed. After that time he had some continued pain and repeat catheterization was done. It was noted that he did not have good reactivity to Plavix. An additional stent was placed in an area of moderate stenosis proximal to the first stent. He was changed to Robinette. The overall course was complicated in addition because of significant drop in hemoglobin. He has had bleeding in the past from diverticulosis. He had an upper endoscopy that showed no bleeding. Colonoscopy cannot be done at this time. It was decided to not restart his Coumadin because he was maintaining sinus rhythm. Triple drug therapy would be too high risk. It is extremely important that he receive potent dual antiplatelet therapy because of his coronary status. He returns today feeling well.  As part of today's evaluation I have reviewed his extensive records. I've carefully updated the problem lists. I reviewed hospital records including the admission note progress notes discharge summary and cath report. Fortunately his left ventricular function remains good with review of his echo  No Known Allergies  Current Outpatient Prescriptions  Medication Sig Dispense Refill  . acetaminophen (TYLENOL) 500 MG tablet Take 1,000 mg by mouth daily. For pain      . amLODipine (NORVASC) 10 MG tablet Take 10 mg by mouth every evening.      [name] aspirin EC 81 MG EC tablet Take 1 tablet (81 mg total) by mouth daily.      . Cholecalciferol (VITAMIN D) 1000 UNITS  capsule Take 2,000 Units by mouth daily.       . colchicine 0.6 MG tablet Take 0.6 mg by mouth 3 (three) times a week. For gout flare ups      . fish oil-omega-3 fatty acids 1000 MG capsule Take 2 g by mouth 2 (two) times daily.        . furosemide (LASIX) 80 MG tablet Take 80 mg by mouth daily. Except Saturdays and Sundays      . insulin glargine (LANTUS) 100 UNIT/ML injection Inject 25-30 Units into the skin daily. As instructed by MD      . Insulin Syringe-Needle U-100 (INSULIN SYRINGE .3CC/31GX5/16") 31G X 5/16" 0.3 ML MISC Use daily as instructed.      . isosorbide mononitrate (IMDUR) 30 MG 24 hr tablet Take 1 tablet (30 mg total) by mouth daily.  90 tablet  1  . magnesium oxide (MAG-OX) 400 MG tablet Take 400 mg by mouth daily.       . metoprolol succinate (TOPROL-XL) 50 MG 24 hr tablet Take 1 tablet (50 mg total) by mouth daily. Take with or immediately following a meal.  90 tablet  1  . Multiple Vitamin (MULTIVITAMIN WITH MINERALS) TABS Take 1 tablet by mouth daily.      . nitroGLYCERIN (NITROSTAT) 0.4 MG SL tablet Place 1 tablet (0.4 mg total) under the tongue every 5 (five) minutes as needed for chest pain.  90 tablet  1  . pantoprazole (PROTONIX) 40 MG tablet Take 1 tablet (  40 mg total) by mouth daily.  30 tablet  0  . Potassium Gluconate (K-99) 595 MG CAPS Take 595 mg by mouth every evening.       . rosuvastatin (CRESTOR) 20 MG tablet Take 20 mg by mouth daily.      . ticagrelor (BRILINTA) 90 MG TABS tablet Take 1 tablet (90 mg total) by mouth 2 (two) times daily.  90 tablet  1  . traMADol (ULTRAM) 50 MG tablet Take 1 tablet (50 mg total) by mouth every 6 (six) hours as needed.  360 tablet  1   No current facility-administered medications for this visit.    History   Social History  . Marital Status: Widowed    Spouse Name: N/A    Number of Children: 2  . Years of Education: N/A   Occupational History  . 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  Smoking status: Former Smoker    Quit date: 11/14/1958  . Smokeless tobacco: Never Used  . Alcohol Use: No  . Drug Use: No  . Sexual Activity: Not on file   Other Topics Concern  . Not on file   Social History Narrative   From [place].  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  Widower 12/2005    Family History  Problem Relation Age of Onset  . Kidney disease Mother     Kidney failure  . Stroke Mother   . Diabetes Mother   . Heart disease Father     MI  . Arthritis Sister   . Cancer Sister     Throat  . Heart disease Sister     MI  . Diabetes Sister   . Prostate cancer Neg Hx   . Colon cancer Neg Hx     Past Medical History  Diagnosis Date  . GERD (gastroesophageal reflux disease)   . Anemia     Secondary to acute blood loss  . Hypertension   . Hyperlipidemia   . Diabetes mellitus     Type II  . Diabetic peripheral neuropathy   . RBBB (right bundle branch block)   . Macular degeneration   . Sleep apnea   . Carotid artery disease     Doppler 09/18/2009 - 49% bilateral stenoses  . Personal history of colonic polyps   . Diverticulosis of colon with hemorrhage 2009    several unit diverticular bleed   . Erectile dysfunction     Mild  . PNA (pneumonia) 9/11    NSTEMI at [facility] with repeat cath, rec medical mgmt   . Atrial flutter 07/2010    September, 2011   Hospital with PNA and cath done.Marland KitchenMarland KitchenCoumadin.  Atrial flutter ablation planned, but  pt. then had atrial fibrillation,/outpatient conversion 09/08/10..NSR..plan to follow..Dr. Caryl Comes  . Chronic kidney disease     ckd #3  . Gout   . Skin cancer     R lower leg, per derm 2012  . Atrial fibrillation     Consideration was given for atrial flutter ablation, but patient developed atrial fibrillation. Cardioversion was done. Dr. Caryl Comes decided to watch him clinically. November, 2011  . Carotid artery disease     49% bilateral, Doppler, November, 2010  . CAD (coronary artery  disease)     Catheterization, September, 2011,  grafts patent from redo CABG,, medical therapy of coronary disease, consideration to proceeding with atrial flutter ablation  . Ejection fraction  EF 60%, echo, 2009  //   EF 65%, echo, September, 2011  . Mitral regurgitation     Mild, echo, September, 2011  . Shoulder pain     Decreasing Crestor did not affect the pain    Past Surgical History  Procedure Laterality Date  . Cardiac catheterization  2006    Nuclear..slight lateral ischemia..medical therapy  . Cardiac catheterization  08/04/2010    grafts patent from redo CABG...medical Rx and ablate Atrial flutter (LV not injected)   . Doppler echocardiography  08/2008    EF 60%  . Doppler echocardiography  08/02/2010    65-70%  . Doppler echocardiography  07/2010    MR mild  . Coronary artery bypass graft  1995     Redo  CABG 2006  . Polypectomy    . Cataract extraction    . Vasectomy    . Tonsillectomy    . Carotid endarterectomy    . Coronary artery bypass graft  1995, 2006  . Cholecystectomy  2008  . Esophagogastroduodenoscopy N/A 06/19/2014    Procedure: ESOPHAGOGASTRODUODENOSCOPY (EGD);  Surgeon: Jerene Bears, MD;  Location: [facility] ENDOSCOPY;  Service: Endoscopy;  Laterality: N/A;    Patient Active Problem List   Diagnosis Date Noted  . Ejection fraction   . Heme positive stool 06/19/2014  . Acute blood loss anemia 06/18/2014  . Long-term (current) use of anticoagulants   . RBBB (right bundle branch block)   . Carotid artery disease   . CAD (coronary artery disease)   . Hx of CABG   . History of gastrointestinal bleeding   . Mitral regurgitation   . ED (erectile dysfunction) 03/01/2012  . CKD (chronic kidney disease) stage 3, GFR 30-59 ml/min 03/24/2011  . Atrial flutter 08/24/2010  . Atrial fibrillation 08/09/2010  . Peripheral sensory neuropathy due to type 2 diabetes mellitus   . GERD   . Sleep apnea   . ANEMIA 09/13/2007  . Type 2 diabetes mellitus with  peripheral neuropathy   . Hyperlipidemia   . Hypertensive heart disease     ROS   Patient denies fever, chills, headache, sweats, rash, change in vision, change in hearing, chills, nausea or vomiting, urinary symptoms. All other systems are reviewed and are negative.  PHYSICAL EXAM   Patient is oriented to person time and place. Affect is normal. He feels well. Head is atraumatic. Sclera and conjunctiva are normal. There is no jugulovenous distention. Lungs are clear. Respiratory effort is nonlabored. Cardiac exam reveals S1 and S2. Abdomen is soft. There is no peripheral edema. There no musculoskeletal deformities. He does have some scattered ecchymoses from his dual antiplatelet therapy.  Filed Vitals:   07/16/14 0912  BP: 130/62  Pulse: 61  Height: 5\' 9"  (1.753 m)  Weight: 188 lb 12.8 oz (85.639 kg)  SpO2: 99%    ASSESSMENT & PLAN

## 2014-07-16 NOTE — Assessment & Plan Note (Addendum)
His hospital course was complicated because of his history of bleeding from diverticulosis in the past. His hemoglobin dropped to 7.9. He was transfused one unit. Upper endoscopy revealed no bleeding. Plans for followup colonoscopy will have to be held for approximately 6 months. His Coumadin was not restarted because triple drug therapy would be significant risk. He is on potent dual antiplatelet therapy for his recent coronary events. Followup CBC will be done.  We'll obtain labs from Dr. Serita Grit office. His hemoglobin on August 28 was up to 11.4.

## 2014-07-16 NOTE — Assessment & Plan Note (Signed)
His mitral regurgitation was only mild in August, 2015.  As part of today's evaluation I spent greater than 25 minutes with his total care. We reviewed extensively his hospitalization and the overall approach to his care.

## 2014-07-16 NOTE — Assessment & Plan Note (Signed)
His coronary status is complex. He is stable. In the overview of this problem in the record I have carefully outlined all of the recent events. He received a stent. He then had to go back to the cath lab and received a second stent. His meds had to be changed from aspirin plus Plavix to aspirin plus Brilinta. He will continue this for now. Coumadin is not to be restarted at this point because of his GI bleeding potential. Fortunately his coronary status is stable as of today.

## 2014-07-16 NOTE — Assessment & Plan Note (Signed)
Fortunately his ejection fraction remains normal. He does have moderate pulmonary hypertension. There is aortic valve sclerosis.

## 2014-07-16 NOTE — Assessment & Plan Note (Addendum)
He has significant renal disease. Fortunately he remains stable during his hospitalization with multiple catheterizations. Chemistry will be checked today. In addition his potassium will be checked. It was low recently and we increased his home dose. He needs to be checked today. We have obtained labs done at Dr. Serita Grit office on July 11, 2014 his creatinine was somewhat improved at 1.99. Potassium was stable at 4.0. The over-the-counter potassium medication that he takes as 2.5 mEq of potassium per tablet. He is taking 2 daily.

## 2014-07-16 NOTE — Assessment & Plan Note (Signed)
He is receiving guidelines directed therapy for his lipids.

## 2014-07-17 DIAGNOSIS — D631 Anemia in chronic kidney disease: Secondary | ICD-10-CM | POA: Diagnosis not present

## 2014-07-17 DIAGNOSIS — M948X9 Other specified disorders of cartilage, unspecified sites: Secondary | ICD-10-CM | POA: Diagnosis not present

## 2014-07-17 DIAGNOSIS — I10 Essential (primary) hypertension: Secondary | ICD-10-CM | POA: Diagnosis not present

## 2014-07-17 DIAGNOSIS — Z23 Encounter for immunization: Secondary | ICD-10-CM | POA: Diagnosis not present

## 2014-07-17 DIAGNOSIS — N183 Chronic kidney disease, stage 3 unspecified: Secondary | ICD-10-CM | POA: Diagnosis not present

## 2014-07-17 DIAGNOSIS — N039 Chronic nephritic syndrome with unspecified morphologic changes: Secondary | ICD-10-CM | POA: Diagnosis not present

## 2014-08-07 ENCOUNTER — Encounter: Payer: Self-pay | Admitting: Cardiology

## 2014-08-07 ENCOUNTER — Ambulatory Visit (INDEPENDENT_AMBULATORY_CARE_PROVIDER_SITE_OTHER): Payer: Medicare Other | Admitting: Cardiology

## 2014-08-07 VITALS — BP 140/64 | HR 67 | Ht 69.0 in | Wt 188.4 lb

## 2014-08-07 DIAGNOSIS — N183 Chronic kidney disease, stage 3 unspecified: Secondary | ICD-10-CM

## 2014-08-07 DIAGNOSIS — I209 Angina pectoris, unspecified: Secondary | ICD-10-CM

## 2014-08-07 DIAGNOSIS — I480 Paroxysmal atrial fibrillation: Secondary | ICD-10-CM

## 2014-08-07 DIAGNOSIS — I2510 Atherosclerotic heart disease of native coronary artery without angina pectoris: Secondary | ICD-10-CM | POA: Diagnosis not present

## 2014-08-07 DIAGNOSIS — I200 Unstable angina: Secondary | ICD-10-CM | POA: Diagnosis not present

## 2014-08-07 DIAGNOSIS — I4891 Unspecified atrial fibrillation: Secondary | ICD-10-CM

## 2014-08-07 DIAGNOSIS — Z8719 Personal history of other diseases of the digestive system: Secondary | ICD-10-CM

## 2014-08-07 DIAGNOSIS — I25119 Atherosclerotic heart disease of native coronary artery with unspecified angina pectoris: Secondary | ICD-10-CM

## 2014-08-07 NOTE — Assessment & Plan Note (Signed)
Patient has not had atrial fib documented in a long time. Currently the risk of adding Coumadin to is to antiplatelet therapy is too high with his GI bleeding history. Coumadin will not be restarted at this time. It will be restarted in the future.

## 2014-08-07 NOTE — Patient Instructions (Signed)
**Note De-Identified [place] Obfuscation** Your physician recommends that you continue on your current medications as directed. Please refer to the Current Medication list given to you today. Change the time that you take your Imdur to taking it  in the evening.  Your physician recommends that you schedule a follow-up appointment in: 3 months.

## 2014-08-07 NOTE — Progress Notes (Signed)
Patient ID: [name], male   DOB: [birth year], 80 y.o.   MRN: [id number]    HPI  Patient is seen today to followup coronary disease and renal dysfunction and paroxysmal atrial fibrillation. He had a recent coronary intervention. He's doing well. He is on dual antiplatelet therapy. We felt that his risk with Coumadin would be to high and is currently on hold. He is having easy bruisability. His most recent hemoglobin was much better at 11.4. He is having slight chest tightness in the evening at rest intermittently. He is active during the day and not having any significant symptoms.  No Known Allergies  Current Outpatient Prescriptions  Medication Sig Dispense Refill  . acetaminophen (TYLENOL) 500 MG tablet Take 1,000 mg by mouth daily. For pain      . amLODipine (NORVASC) 10 MG tablet Take 10 mg by mouth every evening.      [name] aspirin EC 81 MG EC tablet Take 1 tablet (81 mg total) by mouth daily.      . Cholecalciferol (VITAMIN D) 1000 UNITS capsule Take 2,000 Units by mouth daily.       . colchicine 0.6 MG tablet Take 0.6 mg by mouth 3 (three) times a week. For gout flare ups      . fish oil-omega-3 fatty acids 1000 MG capsule Take 2 g by mouth 2 (two) times daily.        . furosemide (LASIX) 80 MG tablet Take 80 mg by mouth daily. Except Saturdays and Sundays      . insulin glargine (LANTUS) 100 UNIT/ML injection Inject 25-30 Units into the skin daily. As instructed by MD      . Insulin Syringe-Needle U-100 (INSULIN SYRINGE .3CC/31GX5/16") 31G X 5/16" 0.3 ML MISC Use daily as instructed.      . isosorbide mononitrate (IMDUR) 30 MG 24 hr tablet Take 1 tablet (30 mg total) by mouth daily.  90 tablet  1  . magnesium oxide (MAG-OX) 400 MG tablet Take 400 mg by mouth daily.       . metoprolol succinate (TOPROL-XL) 50 MG 24 hr tablet Take 1 tablet (50 mg total) by mouth daily. Take with or immediately following a meal.  90 tablet  1  . Multiple Vitamin (MULTIVITAMIN WITH MINERALS) TABS Take 1  tablet by mouth daily.      . nitroGLYCERIN (NITROSTAT) 0.4 MG SL tablet Place 1 tablet (0.4 mg total) under the tongue every 5 (five) minutes as needed for chest pain.  90 tablet  1  . pantoprazole (PROTONIX) 40 MG tablet Take 1 tablet (40 mg total) by mouth daily.  30 tablet  0  . Potassium Gluconate (K-99) 595 MG CAPS Take 595 mg by mouth every evening.       . rosuvastatin (CRESTOR) 20 MG tablet Take 20 mg by mouth daily.      . ticagrelor (BRILINTA) 90 MG TABS tablet Take 1 tablet (90 mg total) by mouth 2 (two) times daily.  90 tablet  1  . traMADol (ULTRAM) 50 MG tablet Take 50 mg by mouth every 6 (six) hours as needed. ARTHRITIS       No current facility-administered medications for this visit.    History   Social History  . Marital Status: Widowed    Spouse Name: N/A    Number of Children: 2  . Years of Education: N/A   Occupational History  . 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  Smoking status: Former Smoker    Quit date: 11/14/1958  . Smokeless tobacco: Never Used  . Alcohol Use: No  . Drug Use: No  . Sexual Activity: Not on file   Other Topics Concern  . Not on file   Social History Narrative   From [place].  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  Widower 12/2005    Family History  Problem Relation Age of Onset  . Kidney disease Mother     Kidney failure  . Stroke Mother   . Diabetes Mother   . Heart disease Father     MI  . Arthritis Sister   . Cancer Sister     Throat  . Heart disease Sister     MI  . Diabetes Sister   . Prostate cancer Neg Hx   . Colon cancer Neg Hx     Past Medical History  Diagnosis Date  . GERD (gastroesophageal reflux disease)   . Anemia     Secondary to acute blood loss  . Hypertension   . Hyperlipidemia   . Diabetes mellitus     Type II  . Diabetic peripheral neuropathy   . RBBB (right bundle branch block)   . Macular degeneration   . Sleep  apnea   . Carotid artery disease     Doppler 09/18/2009 - 49% bilateral stenoses  . Personal history of colonic polyps   . Diverticulosis of colon with hemorrhage 2009    several unit diverticular bleed   . Erectile dysfunction     Mild  . PNA (pneumonia) 9/11    NSTEMI at [facility] with repeat cath, rec medical mgmt   . Atrial flutter 07/2010    September, 2011   Hospital with PNA and cath done.Marland KitchenMarland KitchenCoumadin.  Atrial flutter ablation planned, but  pt. then had atrial fibrillation,/outpatient conversion 09/08/10..NSR..plan to follow..Dr. Caryl Comes  . Chronic kidney disease     ckd #3  . Gout   . Skin cancer     R lower leg, per derm 2012  . Atrial fibrillation     Consideration was given for atrial flutter ablation, but patient developed atrial fibrillation. Cardioversion was done. Dr. Caryl Comes decided to watch him clinically. November, 2011  . Carotid artery disease     49% bilateral, Doppler, November, 2010  . CAD (coronary artery disease)     Catheterization, September, 2011,  grafts patent from redo CABG,, medical therapy of coronary disease, consideration to proceeding with atrial flutter ablation  . Ejection fraction     EF 60%, echo, 2009  //   EF 65%, echo, September, 2011  . Mitral regurgitation     Mild, echo, September, 2011  . Shoulder pain     Decreasing Crestor did not affect the pain    Past Surgical History  Procedure Laterality Date  . Cardiac catheterization  2006    Nuclear..slight lateral ischemia..medical therapy  . Cardiac catheterization  08/04/2010    grafts patent from redo CABG...medical Rx and ablate Atrial flutter (LV not injected)   . Doppler echocardiography  08/2008    EF 60%  . Doppler echocardiography  08/02/2010    65-70%  . Doppler echocardiography  07/2010    MR mild  . Coronary artery bypass graft  1995     Redo  CABG 2006  . Polypectomy    . Cataract extraction    . Vasectomy    . Tonsillectomy    .  Carotid endarterectomy    . Coronary artery bypass  graft  1995, 2006  . Cholecystectomy  2008  . Esophagogastroduodenoscopy N/A 06/19/2014    Procedure: ESOPHAGOGASTRODUODENOSCOPY (EGD);  Surgeon: Jerene Bears, MD;  Location: [facility] ENDOSCOPY;  Service: Endoscopy;  Laterality: N/A;    Patient Active Problem List   Diagnosis Date Noted  . Ejection fraction   . Heme positive stool 06/19/2014  . Acute blood loss anemia 06/18/2014  . Long-term (current) use of anticoagulants   . RBBB (right bundle branch block)   . Carotid artery disease   . CAD (coronary artery disease)   . Hx of CABG   . History of gastrointestinal bleeding   . Mitral regurgitation   . ED (erectile dysfunction) 03/01/2012  . CKD (chronic kidney disease) stage 3, GFR 30-59 ml/min 03/24/2011  . Atrial flutter 08/24/2010  . Atrial fibrillation 08/09/2010  . Peripheral sensory neuropathy due to type 2 diabetes mellitus   . GERD   . Sleep apnea   . ANEMIA 09/13/2007  . Type 2 diabetes mellitus with peripheral neuropathy   . Hyperlipidemia   . Hypertensive heart disease     ROS   Patient denies fever, chills, headache, sweats, rash, change in vision, change in hearing, cough, nausea vomiting, urinary symptoms. All other systems are reviewed and are negative.  PHYSICAL EXAM  Patient looks good. He is here with his fianc. He is oriented to person time and place. Affect is normal. Head is atraumatic. Sclera conjunctiva normal. There is no jugulovenous distention. Lungs are clear. Respiratory effort is nonlabored. Cardiac exam reveals S1 and S2. Abdomen is soft. There is no peripheral edema. He does have area of ecchymoses on his arms.  Filed Vitals:   08/07/14 1038  BP: 140/64  Pulse: 67  Height: 5\' 9"  (1.753 m)  Weight: 188 lb 6.4 oz (85.458 kg)  SpO2: 98%   This  ASSESSMENT & PLAN

## 2014-08-07 NOTE — Assessment & Plan Note (Signed)
Patient has significant GI bleeding in the past. Hemoglobin also dropped to 7.5 during his hospitalization in August, 2015. Endoscopy revealed no bleeding. Colonoscopy will still be on hold because dual antiplatelet therapy cannot be interrupted at this time. His most recent hemoglobin was 11.4, stable. Colonoscopy will be necessary in the future.

## 2014-08-07 NOTE — Assessment & Plan Note (Signed)
The patient is stable at this time. He has slight in frequent chest discomfort at rest at night. He and I have decided to change his Imdur from the morning to the evening and not make any other changes. He will contact me if he has problems. Otherwise I'll see him in 3 months. Despite easy bruisability, His aspirin and Brilinta will be continued at this time. Coumadin will not yet be restarted for his paroxysmal atrial fib.

## 2014-08-07 NOTE — Assessment & Plan Note (Signed)
Renal function is followed very carefully by Dr. Posey Pronto.

## 2014-08-31 ENCOUNTER — Other Ambulatory Visit: Payer: Self-pay | Admitting: Family Medicine

## 2014-08-31 ENCOUNTER — Other Ambulatory Visit: Payer: Self-pay | Admitting: Cardiology

## 2014-08-31 DIAGNOSIS — Z8739 Personal history of other diseases of the musculoskeletal system and connective tissue: Secondary | ICD-10-CM

## 2014-08-31 DIAGNOSIS — E1142 Type 2 diabetes mellitus with diabetic polyneuropathy: Secondary | ICD-10-CM

## 2014-09-01 ENCOUNTER — Other Ambulatory Visit (INDEPENDENT_AMBULATORY_CARE_PROVIDER_SITE_OTHER): Payer: Medicare Other

## 2014-09-01 DIAGNOSIS — E1140 Type 2 diabetes mellitus with diabetic neuropathy, unspecified: Secondary | ICD-10-CM | POA: Diagnosis not present

## 2014-09-01 DIAGNOSIS — E1142 Type 2 diabetes mellitus with diabetic polyneuropathy: Secondary | ICD-10-CM

## 2014-09-01 DIAGNOSIS — Z8639 Personal history of other endocrine, nutritional and metabolic disease: Secondary | ICD-10-CM

## 2014-09-01 DIAGNOSIS — Z8739 Personal history of other diseases of the musculoskeletal system and connective tissue: Secondary | ICD-10-CM

## 2014-09-01 LAB — HEMOGLOBIN A1C: HEMOGLOBIN A1C: 7.1 % — AB (ref 4.6–6.5)

## 2014-09-01 LAB — URIC ACID: URIC ACID, SERUM: 9.6 mg/dL — AB (ref 4.0–7.8)

## 2014-09-01 LAB — LIPID PANEL
CHOLESTEROL: 102 mg/dL (ref 0–200)
HDL: 28.9 mg/dL — ABNORMAL LOW (ref >39.00)
LDL Cholesterol: 56 mg/dL (ref 0–99)
NonHDL: 73.1
Total CHOL/HDL Ratio: 4
Triglycerides: 86 mg/dL (ref 0.0–149.0)
VLDL: 17.2 mg/dL (ref 0.0–40.0)

## 2014-09-01 LAB — MICROALBUMIN / CREATININE URINE RATIO
Creatinine,U: 72.6 mg/dL
MICROALB UR: 6.6 mg/dL — AB (ref 0.0–1.9)
Microalb Creat Ratio: 9.1 mg/g (ref 0.0–30.0)

## 2014-09-08 ENCOUNTER — Encounter: Payer: Self-pay | Admitting: Family Medicine

## 2014-09-08 ENCOUNTER — Ambulatory Visit (INDEPENDENT_AMBULATORY_CARE_PROVIDER_SITE_OTHER): Payer: Medicare Other | Admitting: Family Medicine

## 2014-09-08 VITALS — BP 138/68 | HR 64 | Temp 97.8°F | Ht 67.5 in | Wt 188.0 lb

## 2014-09-08 DIAGNOSIS — Z0000 Encounter for general adult medical examination without abnormal findings: Secondary | ICD-10-CM | POA: Diagnosis not present

## 2014-09-08 DIAGNOSIS — I25119 Atherosclerotic heart disease of native coronary artery with unspecified angina pectoris: Secondary | ICD-10-CM

## 2014-09-08 DIAGNOSIS — M109 Gout, unspecified: Secondary | ICD-10-CM | POA: Diagnosis not present

## 2014-09-08 DIAGNOSIS — N183 Chronic kidney disease, stage 3 unspecified: Secondary | ICD-10-CM

## 2014-09-08 DIAGNOSIS — I209 Angina pectoris, unspecified: Secondary | ICD-10-CM

## 2014-09-08 DIAGNOSIS — Z7189 Other specified counseling: Secondary | ICD-10-CM

## 2014-09-08 DIAGNOSIS — E1142 Type 2 diabetes mellitus with diabetic polyneuropathy: Secondary | ICD-10-CM

## 2014-09-08 DIAGNOSIS — E1140 Type 2 diabetes mellitus with diabetic neuropathy, unspecified: Secondary | ICD-10-CM

## 2014-09-08 DIAGNOSIS — I200 Unstable angina: Secondary | ICD-10-CM | POA: Diagnosis not present

## 2014-09-08 NOTE — Patient Instructions (Signed)
If you AM sugar is >130, then add 1 unit of lantus.  If you AM sugar is <90, then decrease 1 unit of lantus.  If you AM sugar is 90-130, then no change in lantus.  Let me talk to cardiology and renal and we'll be in touch.  Take care.  Glad to see you.  Recheck in about 6 months.

## 2014-09-08 NOTE — Progress Notes (Signed)
Pre visit review using our clinic review tool, if applicable. No additional management support is needed unless otherwise documented below in the visit note.  I have personally reviewed the Medicare Annual Wellness questionnaire and have noted 1. The patient's medical and social history 2. Their use of alcohol, tobacco or illicit drugs 3. Their current medications and supplements 4. The patient's functional ability including ADL's, fall risks, home safety risks and hearing or visual             impairment. 5. Diet and physical activities 6. Evidence for depression or mood disorders  The patients weight, height, BMI have been recorded in the chart and visual acuity is per eye clinic.  I have made referrals, counseling and provided education to the patient based review of the above and I have provided the pt with a written personalized care plan for preventive services.  Provider list updated- see scanned forms.  Routine anticipatory guidance given to patient.  See health maintenance.  Flu 2015 Shingles 2008 PNA 203 Tetanus 2010 Colonoscopy not due at this point, d/w pt.   Prostate cancer screening and PSA options (with potential risks and benefits of testing vs not testing) were discussed along with recent recs/guidelines.  He declined testing PSA at this point. Advance directive d/w pt.  Daughter designated if patient were incapacitated.  Cognitive function addressed- see scanned forms (he forgot them and will drop them off later)- and if abnormal then additional documentation follows.   CAD. Prev stent noted, was CP free after the procedure.  In the interval, in spite of med compliance, notes gradual return of prev sx, with CP on exertion. No sx at rest.  If he pauses his exertion to rest, he'll improve. No CP at OV. No sudden inc in sx.  Bruising note on the brilinta.    Gout, elevated uric acid.  dw pt. This is on hold for now as far as intervention due to the above.  D/w pt. He is  taking colchicine QOD as a suppressive agent.  Still with occ flares of gout.    Diabetes:  Using medications without difficulties:yes Hypoglycemic episodes:no Hyperglycemic episodes:no Feet problems:no loss of sensation.   Blood Sugars averaging: usually ~130-140, recently higher as he couldn't exercise like previously eye exam within last year: pending, he'll arrange this.  MALB slightly elevated, but ACE use is deferred for now with the CAD concerns above.  He has had f/u with renal in the meantime.   PMH and SH reviewed  Meds, vitals, and allergies reviewed.   ROS: See HPI.  Otherwise negative.    GEN: nad, alert and oriented HEENT: mucous membranes moist NECK: supple w/o LA CV: rrr. PULM: ctab, no inc wob ABD: soft, +bs EXT: no edema SKIN: no acute rash  Diabetic foot exam: Normal inspection No skin breakdown No calluses  Normal DP pulses Normal sensation to light touch and monofilament Nails normal

## 2014-09-09 DIAGNOSIS — Z0000 Encounter for general adult medical examination without abnormal findings: Secondary | ICD-10-CM | POA: Insufficient documentation

## 2014-09-09 DIAGNOSIS — M109 Gout, unspecified: Secondary | ICD-10-CM | POA: Insufficient documentation

## 2014-09-09 DIAGNOSIS — Z7189 Other specified counseling: Secondary | ICD-10-CM | POA: Insufficient documentation

## 2014-09-09 NOTE — Assessment & Plan Note (Signed)
See above re: CKD.  I didn't allopurinol at this point.  D/w pt. He agrees that the cards eval is most important here.

## 2014-09-09 NOTE — Assessment & Plan Note (Signed)
He sounds to be in RRR today, w/o CP at OV.  Clearly with dec in exercise tolerance recently.  No change in meds, will notify cards for their input.  He agrees.

## 2014-09-09 NOTE — Assessment & Plan Note (Signed)
Flu 2015 Shingles 2008 PNA 203 Tetanus 2010 Colonoscopy not due at this point, d/w pt.   Prostate cancer screening and PSA options (with potential risks and benefits of testing vs not testing) were discussed along with recent recs/guidelines.  He declined testing PSA at this point. Advance directive d/w pt.  Daughter designated if patient were incapacitated.  Cognitive function addressed- see scanned forms (he forgot them and will drop them off later)- and if abnormal then additional documentation follows.

## 2014-09-09 NOTE — Assessment & Plan Note (Signed)
He has normal sensation today.  A1c is similar to prev.  With inc in sugar recently noted, he'll up titrate his insulin based on AM sugars.  He agrees.

## 2014-09-09 NOTE — Assessment & Plan Note (Signed)
With MALB up now, will ask for renal input.  I didn't start ACE today given the possible need for repeat cath (depending on cards input).

## 2014-09-10 ENCOUNTER — Encounter: Payer: Self-pay | Admitting: Cardiology

## 2014-09-10 ENCOUNTER — Ambulatory Visit (INDEPENDENT_AMBULATORY_CARE_PROVIDER_SITE_OTHER): Payer: Medicare Other | Admitting: Cardiology

## 2014-09-10 ENCOUNTER — Telehealth: Payer: Self-pay

## 2014-09-10 VITALS — BP 142/60 | HR 65 | Ht 67.5 in | Wt 189.1 lb

## 2014-09-10 DIAGNOSIS — I209 Angina pectoris, unspecified: Secondary | ICD-10-CM

## 2014-09-10 DIAGNOSIS — I200 Unstable angina: Secondary | ICD-10-CM | POA: Diagnosis not present

## 2014-09-10 DIAGNOSIS — I480 Paroxysmal atrial fibrillation: Secondary | ICD-10-CM

## 2014-09-10 DIAGNOSIS — N183 Chronic kidney disease, stage 3 unspecified: Secondary | ICD-10-CM

## 2014-09-10 DIAGNOSIS — Z8719 Personal history of other diseases of the digestive system: Secondary | ICD-10-CM

## 2014-09-10 DIAGNOSIS — Z7901 Long term (current) use of anticoagulants: Secondary | ICD-10-CM

## 2014-09-10 DIAGNOSIS — I25798 Atherosclerosis of other coronary artery bypass graft(s) with other forms of angina pectoris: Secondary | ICD-10-CM | POA: Diagnosis not present

## 2014-09-10 MED ORDER — ISOSORBIDE MONONITRATE ER 30 MG PO TB24: 30.0000 mg | ORAL_TABLET | Freq: Two times a day (BID) | ORAL | Status: DC

## 2014-09-10 NOTE — Patient Instructions (Signed)
Your physician has recommended you make the following change in your medication: increase Imdur 30 mg to twice daily  Your physician recommends that you schedule a follow-up appointment in: 2 weeks

## 2014-09-10 NOTE — Progress Notes (Signed)
Patient ID: [name], male   DOB: 10-15-1932, 80 y.o.   MRN: [id number]    HPI  The patient is added to the schedule today to assess his chest pain. He saw his primary physician recently and mentioned that he had some return of his chest discomfort. We called him and arrange for him to come in today. He is stable at this time. He had a complicated hospitalization in August, 2015. He had a non-STEMI area ultimately he had catheterization and a stent was placed. During the hospitalization she had recurrent symptoms. An additional stent was placed proximal to the first stent. He had poor reactivity to Plavix. Brilinta was used with aspirin. He also has paroxysmal atrial fibrillation. Decision was made first to use dual antiplatelet therapy and to try to adjust in the future with him resuming his anticoagulation along with only one of the platelet agents. I saw him in the office on July 16, 2014. I then saw him again on August 07, 2014. At that time he did mention some limited chest discomfort that might occur at night. We adjusted his meds so that he would take his Imdur in the evening. This seemed to work for him. However, since that time he has had more nighttime episodes. He also has some intermittent exertional symptoms in the daytime. He has had one episode lasting long enough that he needed to take a nitroglycerin. He saw his primary physician recently who sent me a note concerning the pain. We added into the schedule today to assess further.  No Known Allergies  Current Outpatient Prescriptions  Medication Sig Dispense Refill  . amLODipine (NORVASC) 10 MG tablet Take 10 mg by mouth every evening.      [name] aspirin EC 81 MG EC tablet Take 1 tablet (81 mg total) by mouth daily.      . Cholecalciferol (VITAMIN D) 1000 UNITS capsule Take 2,000 Units by mouth daily.       . colchicine 0.6 MG tablet Take 0.6 mg by mouth 3 (three) times a week. For gout flare ups      . CRESTOR 20 MG tablet TAKE  1 TABLET DAILY  90 tablet  3  . fish oil-omega-3 fatty acids 1000 MG capsule Take 2 g by mouth 2 (two) times daily.        . furosemide (LASIX) 80 MG tablet Take 80 mg by mouth daily. Except Saturdays and Sundays      . insulin glargine (LANTUS) 100 UNIT/ML injection Inject 25-30 Units into the skin daily. As instructed by MD      . Insulin Syringe-Needle U-100 (INSULIN SYRINGE .3CC/31GX5/16") 31G X 5/16" 0.3 ML MISC Use daily as instructed.      . isosorbide mononitrate (IMDUR) 30 MG 24 hr tablet Take 1 tablet (30 mg total) by mouth 2 (two) times daily.  180 tablet  1  . magnesium oxide (MAG-OX) 400 MG tablet Take 400 mg by mouth daily.       . metoprolol succinate (TOPROL-XL) 50 MG 24 hr tablet Take 1 tablet (50 mg total) by mouth daily. Take with or immediately following a meal.  90 tablet  1  . Multiple Vitamin (MULTIVITAMIN WITH MINERALS) TABS Take 1 tablet by mouth daily.      . nitroGLYCERIN (NITROSTAT) 0.4 MG SL tablet Place 1 tablet (0.4 mg total) under the tongue every 5 (five) minutes as needed for chest pain.  90 tablet  1  . Potassium Gluconate (K-99) 595 MG  CAPS Take 595 mg by mouth every evening.       . ticagrelor (BRILINTA) 90 MG TABS tablet Take 1 tablet (90 mg total) by mouth 2 (two) times daily.  90 tablet  1  . traMADol (ULTRAM) 50 MG tablet Take 50 mg by mouth every 6 (six) hours as needed. ARTHRITIS       No current facility-administered medications for this visit.    History   Social History  . Marital Status: Widowed    Spouse Name: N/A    Number of Children: 2  . Years of Education: N/A   Occupational History  . 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 Smoking status: Former Smoker    Quit date: 11/14/1958  . Smokeless tobacco: Never Used  . Alcohol Use: No  . Drug Use: No  . Sexual Activity: Not on file   Other Topics Concern  . Not on file   Social History Narrative   From [place].  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  Widowed 12/2005.    Family History  Problem Relation Age of Onset  . Kidney disease Mother     Kidney failure  . Stroke Mother   . Diabetes Mother   . Heart disease Father     MI  . Arthritis Sister   . Cancer Sister     Throat  . Heart disease Sister     MI  . Diabetes Sister   . Prostate cancer Neg Hx   . Colon cancer Neg Hx     Past Medical History  Diagnosis Date  . GERD (gastroesophageal reflux disease)   . Anemia     Secondary to acute blood loss  . Hypertension   . Hyperlipidemia   . Diabetes mellitus     Type II  . Diabetic peripheral neuropathy   . RBBB (right bundle branch block)   . Macular degeneration   . Sleep apnea   . Carotid artery disease     Doppler 09/18/2009 - 49% bilateral stenoses  . Personal history of colonic polyps   . Diverticulosis of colon with hemorrhage 2009    several unit diverticular bleed   . Erectile dysfunction     Mild  . PNA (pneumonia) 9/11    NSTEMI at [facility] with repeat cath, rec medical mgmt   . Atrial flutter 07/2010    September, 2011   Hospital with PNA and cath done.Marland KitchenMarland KitchenCoumadin.  Atrial flutter ablation planned, but  pt. then had atrial fibrillation,/outpatient conversion 09/08/10..NSR..plan to follow..Dr. Caryl Comes  . Chronic kidney disease     ckd #3  . Gout   . Skin cancer     R lower leg, per derm 2012  . Atrial fibrillation     Consideration was given for atrial flutter ablation, but patient developed atrial fibrillation. Cardioversion was done. Dr. Caryl Comes decided to watch him clinically. November, 2011  . Carotid artery disease     49% bilateral, Doppler, November, 2010  . CAD (coronary artery disease)     Catheterization, September, 2011,  grafts patent from redo CABG,, medical therapy of coronary disease, consideration to proceeding with atrial flutter ablation  . Ejection fraction     EF 60%, echo, 2009  //   EF 65%, echo, September, 2011  . Mitral  regurgitation     Mild, echo, September, 2011  . Shoulder pain  Decreasing Crestor did not affect the pain    Past Surgical History  Procedure Laterality Date  . Cardiac catheterization  2006    Nuclear..slight lateral ischemia..medical therapy  . Cardiac catheterization  08/04/2010    grafts patent from redo CABG...medical Rx and ablate Atrial flutter (LV not injected)   . Doppler echocardiography  08/2008    EF 60%  . Doppler echocardiography  08/02/2010    65-70%  . Doppler echocardiography  07/2010    MR mild  . Coronary artery bypass graft  1995     Redo  CABG 2006  . Polypectomy    . Cataract extraction    . Vasectomy    . Tonsillectomy    . Carotid endarterectomy    . Coronary artery bypass graft  1995, 2006  . Cholecystectomy  2008  . Esophagogastroduodenoscopy N/A 06/19/2014    Procedure: ESOPHAGOGASTRODUODENOSCOPY (EGD);  Surgeon: Jerene Bears, MD;  Location: [facility] ENDOSCOPY;  Service: Endoscopy;  Laterality: N/A;    Patient Active Problem List   Diagnosis Date Noted  . Gout 09/09/2014  . Medicare annual wellness visit, initial 09/09/2014  . Advance care planning 09/09/2014  . Ejection fraction   . Heme positive stool 06/19/2014  . Acute blood loss anemia 06/18/2014  . Long-term (current) use of anticoagulants   . RBBB (right bundle branch block)   . Carotid artery disease   . CAD (coronary artery disease)   . Hx of CABG   . History of gastrointestinal bleeding   . Mitral regurgitation   . ED (erectile dysfunction) 03/01/2012  . CKD (chronic kidney disease) stage 3, GFR 30-59 ml/min 03/24/2011  . Atrial flutter 08/24/2010  . Atrial fibrillation 08/09/2010  . Peripheral sensory neuropathy due to type 2 diabetes mellitus   . GERD   . Sleep apnea   . ANEMIA 09/13/2007  . Type 2 diabetes mellitus with peripheral neuropathy   . Hyperlipidemia   . Hypertensive heart disease     ROS   Patient denies fever, chills, headache, sweats, rash, change in vision,  change in hearing, cough, nausea or vomiting, urinary symptoms. All other systems are reviewed and are negative.  PHYSICAL EXAM  Patient is here with his wife. He is oriented to person time and place. Affect is normal. Head is atraumatic. Sclera and conjunctiva are normal. There is no jugulovenous distention. Lungs are clear. Respiratory effort is not labored. Cardiac exam reveals an S1 and S2. The abdomen is soft. There is no peripheral edema. There are no musculoskeletal deformities. He does have some ecchymoses on his arms.  Filed Vitals:   09/10/14 1145  BP: 142/60  Pulse: 65  Height: 5' 7.5" (1.715 m)  Weight: 189 lb 1.9 oz (85.784 kg)  SpO2: 99%     ASSESSMENT & PLAN

## 2014-09-10 NOTE — Assessment & Plan Note (Signed)
He thinks he did have some atrial fibrillation since I saw him last. He thinks it was limited. This is concerning but I am not able to add an anticoagulant at this time.

## 2014-09-10 NOTE — Assessment & Plan Note (Signed)
His kidney disease has to be followed very carefully. This makes his cardiac treatment even more complex.

## 2014-09-10 NOTE — Assessment & Plan Note (Addendum)
Patient had significant cardiac events in August, 2015. When I saw him in the office August 07, 2014 there was slight discomfort. He is now had further episodes at night and some during the day. However he says he has not had any in the last 10 days. My first recommendation to him was repeat catheterization. He is very hasn't about this. We have therefore chosen to adjust his medicines by increasing his Imdur dose and following him carefully. He will contact us if he has increasing symptoms. He will go to the hospital via prolonged symptoms. I will see him back for early follow-up.Marland KitchenMarland Kitchen

## 2014-09-10 NOTE — Assessment & Plan Note (Signed)
The patient had GI blood loss in August, 2015. Endoscopy revealed no bleeding. Colonoscopy had to be deferred. Follow-up hemoglobin was done over time. In late September it was 11.4. This is a complicated problem because he needs dual antiplatelet therapy. He also needs anticoagulation for his atrial fib that has not been restarted because of fear of GI bleeding. I will check to see when his last hemoglobin was done.

## 2014-09-10 NOTE — Assessment & Plan Note (Addendum)
He will not be safe to change his current dual antiplatelet therapy. He has risk of GI bleeding. Therefore his anticoagulant will not be restarted at this time.  As part of today's evaluation I spent greater than 25 minutes with his total care. More than half of this time was with direct counseling with the patient and his wife. We had an extensive discussion about the pros and cons of medical therapy versus repeat catheterization at this time.

## 2014-09-10 NOTE — Telephone Encounter (Signed)
Per Dr [name] this pt needs to have an OV today. The pt states that he will be here at 11:45. Appointment scheduled.

## 2014-09-11 ENCOUNTER — Other Ambulatory Visit (INDEPENDENT_AMBULATORY_CARE_PROVIDER_SITE_OTHER): Payer: Medicare Other | Admitting: *Deleted

## 2014-09-11 DIAGNOSIS — Z8719 Personal history of other diseases of the digestive system: Secondary | ICD-10-CM | POA: Diagnosis not present

## 2014-09-12 LAB — CBC WITH DIFFERENTIAL/PLATELET
Basophils Absolute: 0 10*3/uL (ref 0.0–0.1)
Basophils Relative: 0.6 % (ref 0.0–3.0)
EOS PCT: 4.6 % (ref 0.0–5.0)
Eosinophils Absolute: 0.3 10*3/uL (ref 0.0–0.7)
HCT: 33.7 % — ABNORMAL LOW (ref 39.0–52.0)
Hemoglobin: 10.9 g/dL — ABNORMAL LOW (ref 13.0–17.0)
Lymphocytes Relative: 24 % (ref 12.0–46.0)
Lymphs Abs: 1.7 10*3/uL (ref 0.7–4.0)
MCHC: 32.4 g/dL (ref 30.0–36.0)
MCV: 87.1 fl (ref 78.0–100.0)
Monocytes Absolute: 0.5 10*3/uL (ref 0.1–1.0)
Monocytes Relative: 6.5 % (ref 3.0–12.0)
NEUTROS PCT: 64.3 % (ref 43.0–77.0)
Neutro Abs: 4.6 10*3/uL (ref 1.4–7.7)
Platelets: 186 10*3/uL (ref 150.0–400.0)
RBC: 3.87 Mil/uL — ABNORMAL LOW (ref 4.22–5.81)
RDW: 16.4 % — ABNORMAL HIGH (ref 11.5–15.5)
WBC: 7.2 10*3/uL (ref 4.0–10.5)

## 2014-09-17 ENCOUNTER — Ambulatory Visit: Payer: Medicare Other | Admitting: Cardiology

## 2014-09-21 ENCOUNTER — Other Ambulatory Visit: Payer: Self-pay | Admitting: Family Medicine

## 2014-09-22 ENCOUNTER — Encounter: Payer: Self-pay | Admitting: Cardiology

## 2014-09-22 ENCOUNTER — Ambulatory Visit (INDEPENDENT_AMBULATORY_CARE_PROVIDER_SITE_OTHER): Payer: Medicare Other | Admitting: Cardiology

## 2014-09-22 VITALS — BP 130/50 | HR 64 | Ht 67.5 in | Wt 191.1 lb

## 2014-09-22 DIAGNOSIS — I480 Paroxysmal atrial fibrillation: Secondary | ICD-10-CM

## 2014-09-22 DIAGNOSIS — I200 Unstable angina: Secondary | ICD-10-CM | POA: Diagnosis not present

## 2014-09-22 DIAGNOSIS — I25768 Atherosclerosis of bypass graft of coronary artery of transplanted heart with other forms of angina pectoris: Secondary | ICD-10-CM

## 2014-09-22 MED ORDER — ISOSORBIDE MONONITRATE ER 30 MG PO TB24: ORAL_TABLET | ORAL | Status: DC

## 2014-09-22 MED ORDER — TICAGRELOR 90 MG PO TABS: 90.0000 mg | ORAL_TABLET | Freq: Two times a day (BID) | ORAL | Status: DC

## 2014-09-22 NOTE — Telephone Encounter (Signed)
Looks liike Dr. [name] has refilled.

## 2014-09-22 NOTE — Assessment & Plan Note (Signed)
He does appear to be having exertional angina at this time. He has had no further rest pain. I'll increase his Imdur dose. I'll see him back for early follow-up. I do not have much leeway in increasing his beta blocker. I may try to add Renexa.

## 2014-09-22 NOTE — Progress Notes (Signed)
Patient ID: [name], male   DOB: Oct 10, 1932, 80 y.o.   MRN: [id number]    HPI  patient returns today for follow-up of his coronary disease. I saw him last September 10, 2014. My note from that day carefully outlines his coronary disease and my concerns about his return of chest pain. We considered repeat catheterization. The patient preferred medical therapy. I was concerned because he had had some nighttime rest pain. His Imdur was increased to 30 mg twice a day. He feels better with this. He has not had any rest pain. He still has exertional pain. His resting heart rate is 64. I'm hasn't to push his beta blocker.  No Known Allergies  Current Outpatient Prescriptions  Medication Sig Dispense Refill  . amLODipine (NORVASC) 10 MG tablet Take 10 mg by mouth every evening.    [name] aspirin EC 81 MG EC tablet Take 1 tablet (81 mg total) by mouth daily.    . Cholecalciferol (VITAMIN D) 1000 UNITS capsule Take 2,000 Units by mouth daily.     . colchicine 0.6 MG tablet Take 0.6 mg by mouth 3 (three) times a week. For gout flare ups    . CRESTOR 20 MG tablet TAKE 1 TABLET DAILY 90 tablet 3  . fish oil-omega-3 fatty acids 1000 MG capsule Take 2 g by mouth 2 (two) times daily.      . furosemide (LASIX) 80 MG tablet Take 80 mg by mouth daily. Except Saturdays and Sundays    . insulin glargine (LANTUS) 100 UNIT/ML injection Inject 25-30 Units into the skin daily. As instructed by MD    . Insulin Syringe-Needle U-100 (INSULIN SYRINGE .3CC/31GX5/16") 31G X 5/16" 0.3 ML MISC Use daily as instructed.    . isosorbide mononitrate (IMDUR) 30 MG 24 hr tablet Take 1 tablet (30 mg total) by mouth 2 (two) times daily. 180 tablet 1  . magnesium oxide (MAG-OX) 400 MG tablet Take 400 mg by mouth daily.     . metoprolol succinate (TOPROL-XL) 50 MG 24 hr tablet Take 1 tablet (50 mg total) by mouth daily. Take with or immediately following a meal. 90 tablet 1  . Multiple Vitamin (MULTIVITAMIN WITH MINERALS) TABS Take 1  tablet by mouth daily.    . nitroGLYCERIN (NITROSTAT) 0.4 MG SL tablet Place 1 tablet (0.4 mg total) under the tongue every 5 (five) minutes as needed for chest pain. 90 tablet 1  . Potassium Gluconate (K-99) 595 MG CAPS Take 595 mg by mouth every evening.     . ticagrelor (BRILINTA) 90 MG TABS tablet Take 1 tablet (90 mg total) by mouth 2 (two) times daily. 90 tablet 1  . traMADol (ULTRAM) 50 MG tablet Take 50 mg by mouth every 6 (six) hours as needed. ARTHRITIS     No current facility-administered medications for this visit.    History   Social History  . Marital Status: Widowed    Spouse Name: N/A    Number of Children: 2  . Years of Education: N/A   Occupational History  . 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 Smoking status: Former Smoker    Quit date: 11/14/1958  . Smokeless tobacco: Never Used  . Alcohol Use: No  . Drug Use: No  . Sexual Activity: Not on file   Other Topics Concern  . Not on file   Social History Narrative   From [place].  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  Widowed 12/2005.    Family History  Problem Relation Age of Onset  . Kidney disease Mother     Kidney failure  . Stroke Mother   . Diabetes Mother   . Heart disease Father     MI  . Arthritis Sister   . Cancer Sister     Throat  . Heart disease Sister     MI  . Diabetes Sister   . Prostate cancer Neg Hx   . Colon cancer Neg Hx     Past Medical History  Diagnosis Date  . GERD (gastroesophageal reflux disease)   . Anemia     Secondary to acute blood loss  . Hypertension   . Hyperlipidemia   . Diabetes mellitus     Type II  . Diabetic peripheral neuropathy   . RBBB (right bundle branch block)   . Macular degeneration   . Sleep apnea   . Carotid artery disease     Doppler 09/18/2009 - 49% bilateral stenoses  . Personal history of colonic polyps   . Diverticulosis of colon with  hemorrhage 2009    several unit diverticular bleed   . Erectile dysfunction     Mild  . PNA (pneumonia) 9/11    NSTEMI at [facility] with repeat cath, rec medical mgmt   . Atrial flutter 07/2010    September, 2011   Hospital with PNA and cath done.Marland KitchenMarland KitchenCoumadin.  Atrial flutter ablation planned, but  pt. then had atrial fibrillation,/outpatient conversion 09/08/10..NSR..plan to follow..Dr. Caryl Comes  . Chronic kidney disease     ckd #3  . Gout   . Skin cancer     R lower leg, per derm 2012  . Atrial fibrillation     Consideration was given for atrial flutter ablation, but patient developed atrial fibrillation. Cardioversion was done. Dr. Caryl Comes decided to watch him clinically. November, 2011  . Carotid artery disease     49% bilateral, Doppler, November, 2010  . CAD (coronary artery disease)     Catheterization, September, 2011,  grafts patent from redo CABG,, medical therapy of coronary disease, consideration to proceeding with atrial flutter ablation  . Ejection fraction     EF 60%, echo, 2009  //   EF 65%, echo, September, 2011  . Mitral regurgitation     Mild, echo, September, 2011  . Shoulder pain     Decreasing Crestor did not affect the pain    Past Surgical History  Procedure Laterality Date  . Cardiac catheterization  2006    Nuclear..slight lateral ischemia..medical therapy  . Cardiac catheterization  08/04/2010    grafts patent from redo CABG...medical Rx and ablate Atrial flutter (LV not injected)   . Doppler echocardiography  08/2008    EF 60%  . Doppler echocardiography  08/02/2010    65-70%  . Doppler echocardiography  07/2010    MR mild  . Coronary artery bypass graft  1995     Redo  CABG 2006  . Polypectomy    . Cataract extraction    . Vasectomy    . Tonsillectomy    . Carotid endarterectomy    . Coronary artery bypass graft  1995, 2006  . Cholecystectomy  2008  . Esophagogastroduodenoscopy N/A 06/19/2014    Procedure: ESOPHAGOGASTRODUODENOSCOPY (EGD);  Surgeon: Jerene Bears, MD;  Location: [facility] ENDOSCOPY;  Service: Endoscopy;  Laterality: N/A;    Patient Active Problem List   Diagnosis Date Noted  .  Gout 09/09/2014  . Medicare annual wellness visit, initial 09/09/2014  . Advance care planning 09/09/2014  . Ejection fraction   . Heme positive stool 06/19/2014  . Acute blood loss anemia 06/18/2014  . Long-term (current) use of anticoagulants   . RBBB (right bundle branch block)   . Carotid artery disease   . CAD (coronary artery disease)   . Hx of CABG   . History of gastrointestinal bleeding   . Mitral regurgitation   . ED (erectile dysfunction) 03/01/2012  . CKD (chronic kidney disease) stage 3, GFR 30-59 ml/min 03/24/2011  . Atrial flutter 08/24/2010  . Atrial fibrillation 08/09/2010  . Peripheral sensory neuropathy due to type 2 diabetes mellitus   . GERD   . Sleep apnea   . ANEMIA 09/13/2007  . Type 2 diabetes mellitus with peripheral neuropathy   . Hyperlipidemia   . Hypertensive heart disease     ROS   patient denies fever, chills, headache, sweats, rash, change in vision, change in hearing, cough, nausea or vomiting, urinary symptoms. All other systems are reviewed and are negative.  PHYSICAL EXAM  patient is oriented to person time and place. Affect is normal. Head is atraumatic. Sclera and conjunctiva are normal. There is no jugulovenous distention. Lungs are clear. Respiratory effort is nonlabored. Cardiac exam reveals an S1 and S2. Abdomen is soft and there is no peripheral edema. He does have some ecchymoses on his forearms.  Filed Vitals:   09/22/14 1333  BP: 130/50  Pulse: 64  Height: 5' 7.5" (1.715 m)  Weight: 191 lb 1.9 oz (86.691 kg)  SpO2: 98%     ASSESSMENT & PLAN

## 2014-09-22 NOTE — Patient Instructions (Addendum)
Your physician has recommended you make the following change in your medication: increase Imdur to 60 mg in the morning and 30 mg in the evening  Your physician recommends that you schedule a follow-up appointment in: 2 weeks

## 2014-09-22 NOTE — Assessment & Plan Note (Addendum)
He is not yet back on his anticoagulation because of concern of his recent GI bleed and is definite need for dual antiplatelet therapy. He is not have any definite symptoms of atrial fibrillation.  No change today.

## 2014-09-23 ENCOUNTER — Ambulatory Visit: Payer: Medicare Other | Admitting: Cardiology

## 2014-09-23 NOTE — Telephone Encounter (Signed)
Can you please figure out what this is an take care of it. Thank you

## 2014-10-13 ENCOUNTER — Encounter: Payer: Self-pay | Admitting: Cardiology

## 2014-10-13 ENCOUNTER — Ambulatory Visit (INDEPENDENT_AMBULATORY_CARE_PROVIDER_SITE_OTHER): Payer: Medicare Other | Admitting: Cardiology

## 2014-10-13 VITALS — BP 136/60 | HR 76 | Ht 67.5 in | Wt 192.8 lb

## 2014-10-13 DIAGNOSIS — I25810 Atherosclerosis of coronary artery bypass graft(s) without angina pectoris: Secondary | ICD-10-CM | POA: Diagnosis not present

## 2014-10-13 DIAGNOSIS — I200 Unstable angina: Secondary | ICD-10-CM

## 2014-10-13 DIAGNOSIS — I480 Paroxysmal atrial fibrillation: Secondary | ICD-10-CM

## 2014-10-13 NOTE — Assessment & Plan Note (Signed)
The main issue will be when we can get him back on his anticoagulation. Because of coronary status, he must be on aspirin and Brilinta at this time. He had GI bleeding. He is not a candidate at this time for elective triple drug therapy.

## 2014-10-13 NOTE — Patient Instructions (Signed)
Your physician recommends that you continue on your current medications as directed. Please refer to the Current Medication list given to you today.  Your physician recommends that you schedule a follow-up appointment in: 2 to 3 weeks

## 2014-10-13 NOTE — Assessment & Plan Note (Signed)
Coronary disease is stable. He does have exertional symptoms. These are infrequent. I'm not going to change his medications today. We may have a possibility of increasing his isosorbide further or using Renexa. On the other hand, it may still be optimal to proceed with repeat catheterization. I will see him back in several weeks to discuss this further.

## 2014-10-13 NOTE — Progress Notes (Signed)
Patient ID: [name], male   DOB: January 09, 1932, 80 y.o.   MRN: [id number]    HPI Patient is seen today to follow-up coronary disease and atrial fibrillation. I saw him last on September 22, 2014. We had adjusted the isosorbide dose up. He seems to be doing well with this. He has some mild exertional chest discomfort. He did have one episode of pain at night that lasted only a few minutes. He is not having any significant palpitations.  No Known Allergies  Current Outpatient Prescriptions  Medication Sig Dispense Refill  . amLODipine (NORVASC) 10 MG tablet Take 10 mg by mouth every evening.    [name] aspirin EC 81 MG EC tablet Take 1 tablet (81 mg total) by mouth daily.    . Cholecalciferol (VITAMIN D) 1000 UNITS capsule Take 2,000 Units by mouth daily.     . colchicine 0.6 MG tablet Take 0.6 mg by mouth 3 (three) times a week. For gout flare ups    . CRESTOR 20 MG tablet TAKE 1 TABLET DAILY 90 tablet 3  . fish oil-omega-3 fatty acids 1000 MG capsule Take 2 g by mouth 2 (two) times daily.      . furosemide (LASIX) 80 MG tablet Take 80 mg by mouth daily. Except Saturdays and Sundays    . insulin glargine (LANTUS) 100 UNIT/ML injection Inject 25-30 Units into the skin daily. As instructed by MD    . Insulin Syringe-Needle U-100 (INSULIN SYRINGE .3CC/31GX5/16") 31G X 5/16" 0.3 ML MISC Use daily as instructed.    . isosorbide mononitrate (IMDUR) 30 MG 24 hr tablet Take 60 mg (2 tablets) in the morning and 30 mg (1 tablet) in the evening on next refill 270 tablet 1  . magnesium oxide (MAG-OX) 400 MG tablet Take 400 mg by mouth daily.     . metoprolol succinate (TOPROL-XL) 50 MG 24 hr tablet Take 1 tablet (50 mg total) by mouth daily. Take with or immediately following a meal. 90 tablet 1  . Multiple Vitamin (MULTIVITAMIN WITH MINERALS) TABS Take 1 tablet by mouth daily.    . nitroGLYCERIN (NITROSTAT) 0.4 MG SL tablet Place 1 tablet (0.4 mg total) under the tongue every 5 (five) minutes as needed for  chest pain. 90 tablet 1  . Potassium Gluconate (K-99) 595 MG CAPS Take 595 mg by mouth every evening.     . ticagrelor (BRILINTA) 90 MG TABS tablet Take 1 tablet (90 mg total) by mouth 2 (two) times daily. 90 tablet 1  . traMADol (ULTRAM) 50 MG tablet Take 50 mg by mouth every 6 (six) hours as needed. ARTHRITIS     No current facility-administered medications for this visit.    History   Social History  . Marital Status: Widowed    Spouse Name: N/A    Number of Children: 2  . Years of Education: N/A   Occupational History  . 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 Smoking status: Former Smoker    Quit date: 11/14/1958  . Smokeless tobacco: Never Used  . Alcohol Use: No  . Drug Use: No  . Sexual Activity: Not on file   Other Topics Concern  . Not on file   Social History Narrative   From [place].  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  Widowed 12/2005.    Family History  Problem Relation Age of Onset  .  Kidney disease Mother     Kidney failure  . Stroke Mother   . Diabetes Mother   . Heart disease Father     MI  . Arthritis Sister   . Cancer Sister     Throat  . Heart disease Sister     MI  . Diabetes Sister   . Prostate cancer Neg Hx   . Colon cancer Neg Hx     Past Medical History  Diagnosis Date  . GERD (gastroesophageal reflux disease)   . Anemia     Secondary to acute blood loss  . Hypertension   . Hyperlipidemia   . Diabetes mellitus     Type II  . Diabetic peripheral neuropathy   . RBBB (right bundle branch block)   . Macular degeneration   . Sleep apnea   . Carotid artery disease     Doppler 09/18/2009 - 49% bilateral stenoses  . Personal history of colonic polyps   . Diverticulosis of colon with hemorrhage 2009    several unit diverticular bleed   . Erectile dysfunction     Mild  . PNA (pneumonia) 9/11    NSTEMI at [facility] with repeat cath, rec  medical mgmt   . Atrial flutter 07/2010    September, 2011   Hospital with PNA and cath done.Marland KitchenMarland KitchenCoumadin.  Atrial flutter ablation planned, but  pt. then had atrial fibrillation,/outpatient conversion 09/08/10..NSR..plan to follow..Dr. Caryl Comes  . Chronic kidney disease     ckd #3  . Gout   . Skin cancer     R lower leg, per derm 2012  . Atrial fibrillation     Consideration was given for atrial flutter ablation, but patient developed atrial fibrillation. Cardioversion was done. Dr. Caryl Comes decided to watch him clinically. November, 2011  . Carotid artery disease     49% bilateral, Doppler, November, 2010  . CAD (coronary artery disease)     Catheterization, September, 2011,  grafts patent from redo CABG,, medical therapy of coronary disease, consideration to proceeding with atrial flutter ablation  . Ejection fraction     EF 60%, echo, 2009  //   EF 65%, echo, September, 2011  . Mitral regurgitation     Mild, echo, September, 2011  . Shoulder pain     Decreasing Crestor did not affect the pain    Past Surgical History  Procedure Laterality Date  . Cardiac catheterization  2006    Nuclear..slight lateral ischemia..medical therapy  . Cardiac catheterization  08/04/2010    grafts patent from redo CABG...medical Rx and ablate Atrial flutter (LV not injected)   . Doppler echocardiography  08/2008    EF 60%  . Doppler echocardiography  08/02/2010    65-70%  . Doppler echocardiography  07/2010    MR mild  . Coronary artery bypass graft  1995     Redo  CABG 2006  . Polypectomy    . Cataract extraction    . Vasectomy    . Tonsillectomy    . Carotid endarterectomy    . Coronary artery bypass graft  1995, 2006  . Cholecystectomy  2008  . Esophagogastroduodenoscopy N/A 06/19/2014    Procedure: ESOPHAGOGASTRODUODENOSCOPY (EGD);  Surgeon: Jerene Bears, MD;  Location: [facility] ENDOSCOPY;  Service: Endoscopy;  Laterality: N/A;    Patient Active Problem List   Diagnosis Date Noted  . Gout 09/09/2014    . Medicare annual wellness visit, initial 09/09/2014  . Advance care planning 09/09/2014  . Ejection fraction   . Heme  positive stool 06/19/2014  . Acute blood loss anemia 06/18/2014  . Long-term (current) use of anticoagulants   . RBBB (right bundle branch block)   . Carotid artery disease   . CAD (coronary artery disease)   . Hx of CABG   . History of gastrointestinal bleeding   . Mitral regurgitation   . ED (erectile dysfunction) 03/01/2012  . CKD (chronic kidney disease) stage 3, GFR 30-59 ml/min 03/24/2011  . Atrial flutter 08/24/2010  . Atrial fibrillation 08/09/2010  . Peripheral sensory neuropathy due to type 2 diabetes mellitus   . GERD   . Sleep apnea   . ANEMIA 09/13/2007  . Type 2 diabetes mellitus with peripheral neuropathy   . Hyperlipidemia   . Hypertensive heart disease     ROS  Patient denies fever, chills, headache, sweats, rash, change in vision, change in hearing, cough, nausea or vomiting, urinary symptoms. All other systems are reviewed and are negative.  PHYSICAL EXAM Patient is oriented to person time and place. Affect is normal. He is here with his wife. Head is atraumatic. Sclera and conjunctiva are normal. There is no jugulovenous distention. Lungs are clear. Respiratory effort is not labored. Cardiac exam reveals S1 and S2. There is a soft systolic murmur. The abdomen is soft. There is no peripheral edema. He does have multiple ecchymoses on his arms. There is no musculoskeletal deformities. He is overweight.  Filed Vitals:   10/13/14 0939  BP: 136/60  Pulse: 76  Height: 5' 7.5" (1.715 m)  Weight: 192 lb 12.8 oz (87.454 kg)  SpO2: 98%     ASSESSMENT & PLAN

## 2014-10-22 ENCOUNTER — Ambulatory Visit: Payer: Medicare Other | Admitting: Cardiology

## 2014-10-23 ENCOUNTER — Encounter (HOSPITAL_COMMUNITY): Payer: Self-pay | Admitting: Interventional Cardiology

## 2014-10-29 ENCOUNTER — Other Ambulatory Visit: Payer: Self-pay | Admitting: Cardiology

## 2014-10-29 ENCOUNTER — Ambulatory Visit (INDEPENDENT_AMBULATORY_CARE_PROVIDER_SITE_OTHER): Payer: Medicare Other | Admitting: Cardiology

## 2014-10-29 ENCOUNTER — Encounter: Payer: Self-pay | Admitting: Cardiology

## 2014-10-29 VITALS — BP 132/68 | HR 60 | Ht 67.0 in | Wt 189.0 lb

## 2014-10-29 DIAGNOSIS — I480 Paroxysmal atrial fibrillation: Secondary | ICD-10-CM

## 2014-10-29 DIAGNOSIS — I119 Hypertensive heart disease without heart failure: Secondary | ICD-10-CM

## 2014-10-29 DIAGNOSIS — I25118 Atherosclerotic heart disease of native coronary artery with other forms of angina pectoris: Secondary | ICD-10-CM | POA: Diagnosis not present

## 2014-10-29 DIAGNOSIS — I779 Disorder of arteries and arterioles, unspecified: Secondary | ICD-10-CM | POA: Diagnosis not present

## 2014-10-29 DIAGNOSIS — I739 Peripheral vascular disease, unspecified: Secondary | ICD-10-CM

## 2014-10-29 DIAGNOSIS — I200 Unstable angina: Secondary | ICD-10-CM | POA: Diagnosis not present

## 2014-10-29 NOTE — Patient Instructions (Signed)
Your physician recommends that you schedule a follow-up appointment in: late January or early February with Dr. Joette Catching for December 10, 2014 at 9:15 AM   Your physician recommends that you continue on your current medications as directed. Please refer to the Current Medication list given to you today.

## 2014-10-29 NOTE — Assessment & Plan Note (Signed)
It is been almost 2 years since his last carotid Doppler. I will rereview this with him at the time of his next visit.

## 2014-10-29 NOTE — Assessment & Plan Note (Signed)
His angina pattern is stable at this time. After careful review we have decided not to proceed with catheterization at this time. I will see him back for early follow-up.

## 2014-10-29 NOTE — Progress Notes (Signed)
Patient ID: [name], male   DOB: [birth year], 80 y.o.   MRN: [id number]    HPI Patient returns for follow-up coronary disease. I saw him in last October 13, 2014. He has ongoing exertional angina after his last intervention. We have been adjusting his medicines. When I saw him last the plan was to see him back today and decide if he was necessary to proceed with catheterization. Since his last visit he continues to have some exertional chest discomfort. However it has decreased some over time. It does not sound like unstable angina at this time. We've discussed this carefully and we will not proceed with catheterization at this time.  No Known Allergies  Current Outpatient Prescriptions  Medication Sig Dispense Refill  . amLODipine (NORVASC) 10 MG tablet Take 10 mg by mouth every evening.    [name] aspirin EC 81 MG EC tablet Take 1 tablet (81 mg total) by mouth daily.    . Cholecalciferol (VITAMIN D) 1000 UNITS capsule Take 2,000 Units by mouth daily.     . colchicine 0.6 MG tablet Take 0.6 mg by mouth 3 (three) times a week. For gout flare ups    . CRESTOR 20 MG tablet TAKE 1 TABLET DAILY 90 tablet 3  . fish oil-omega-3 fatty acids 1000 MG capsule Take 2 g by mouth 2 (two) times daily.      . furosemide (LASIX) 80 MG tablet Take 80 mg by mouth daily. Except Saturdays and Sundays    . insulin glargine (LANTUS) 100 UNIT/ML injection Inject 25-30 Units into the skin daily. As instructed by MD    . Insulin Syringe-Needle U-100 (INSULIN SYRINGE .3CC/31GX5/16") 31G X 5/16" 0.3 ML MISC Use daily as instructed.    . isosorbide mononitrate (IMDUR) 30 MG 24 hr tablet Take 60 mg (2 tablets) in the morning and 30 mg (1 tablet) in the evening on next refill 270 tablet 1  . magnesium oxide (MAG-OX) 400 MG tablet Take 400 mg by mouth daily.     . metoprolol succinate (TOPROL-XL) 50 MG 24 hr tablet Take 1 tablet (50 mg total) by mouth daily. Take with or immediately following a meal. 90 tablet 1  . Multiple  Vitamin (MULTIVITAMIN WITH MINERALS) TABS Take 1 tablet by mouth daily.    . nitroGLYCERIN (NITROSTAT) 0.4 MG SL tablet Place 1 tablet (0.4 mg total) under the tongue every 5 (five) minutes as needed for chest pain. 90 tablet 1  . Potassium Gluconate (K-99) 595 MG CAPS Take 595 mg by mouth every evening.     . ticagrelor (BRILINTA) 90 MG TABS tablet Take 1 tablet (90 mg total) by mouth 2 (two) times daily. 90 tablet 1  . traMADol (ULTRAM) 50 MG tablet Take 50 mg by mouth every 6 (six) hours as needed. ARTHRITIS     No current facility-administered medications for this visit.    History   Social History  . Marital Status: Widowed    Spouse Name: N/A    Number of Children: 2  . Years of Education: N/A   Occupational History  . 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 Smoking status: Former Smoker    Quit date: 11/14/1958  . Smokeless tobacco: Never Used  . Alcohol Use: No  . Drug Use: No  . Sexual Activity: Not on file   Other Topics Concern  . Not on file   Social History Narrative   From [place].  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  Widowed 12/2005.    Family History  Problem Relation Age of Onset  . Kidney disease Mother     Kidney failure  . Stroke Mother   . Diabetes Mother   . Heart disease Father     MI  . Arthritis Sister   . Cancer Sister     Throat  . Heart disease Sister     MI  . Diabetes Sister   . Prostate cancer Neg Hx   . Colon cancer Neg Hx     Past Medical History  Diagnosis Date  . GERD (gastroesophageal reflux disease)   . Anemia     Secondary to acute blood loss  . Hypertension   . Hyperlipidemia   . Diabetes mellitus     Type II  . Diabetic peripheral neuropathy   . RBBB (right bundle branch block)   . Macular degeneration   . Sleep apnea   . Carotid artery disease     Doppler 09/18/2009 - 49% bilateral stenoses  . Personal history of  colonic polyps   . Diverticulosis of colon with hemorrhage 2009    several unit diverticular bleed   . Erectile dysfunction     Mild  . PNA (pneumonia) 9/11    NSTEMI at [facility] with repeat cath, rec medical mgmt   . Atrial flutter 07/2010    September, 2011   Hospital with PNA and cath done.Marland KitchenMarland KitchenCoumadin.  Atrial flutter ablation planned, but  pt. then had atrial fibrillation,/outpatient conversion 09/08/10..NSR..plan to follow..Dr. Caryl Comes  . Chronic kidney disease     ckd #3  . Gout   . Skin cancer     R lower leg, per derm 2012  . Atrial fibrillation     Consideration was given for atrial flutter ablation, but patient developed atrial fibrillation. Cardioversion was done. Dr. Caryl Comes decided to watch him clinically. November, 2011  . Carotid artery disease     49% bilateral, Doppler, November, 2010  . CAD (coronary artery disease)     Catheterization, September, 2011,  grafts patent from redo CABG,, medical therapy of coronary disease, consideration to proceeding with atrial flutter ablation  . Ejection fraction     EF 60%, echo, 2009  //   EF 65%, echo, September, 2011  . Mitral regurgitation     Mild, echo, September, 2011  . Shoulder pain     Decreasing Crestor did not affect the pain    Past Surgical History  Procedure Laterality Date  . Cardiac catheterization  2006    Nuclear..slight lateral ischemia..medical therapy  . Cardiac catheterization  08/04/2010    grafts patent from redo CABG...medical Rx and ablate Atrial flutter (LV not injected)   . Doppler echocardiography  08/2008    EF 60%  . Doppler echocardiography  08/02/2010    65-70%  . Doppler echocardiography  07/2010    MR mild  . Coronary artery bypass graft  1995     Redo  CABG 2006  . Polypectomy    . Cataract extraction    . Vasectomy    . Tonsillectomy    . Carotid endarterectomy    . Coronary artery bypass graft  1995, 2006  . Cholecystectomy  2008  . Esophagogastroduodenoscopy N/A 06/19/2014    Procedure:  ESOPHAGOGASTRODUODENOSCOPY (EGD);  Surgeon: Jerene Bears, MD;  Location: [facility] ENDOSCOPY;  Service: Endoscopy;  Laterality: N/A;  . Left heart catheterization with coronary/graft  angiogram N/A 06/11/2014    Procedure: LEFT HEART CATHETERIZATION WITH Beatrix Fetters;  Surgeon: Sinclair Grooms, MD;  Location: [facility] CATH LAB;  Service: Cardiovascular;  Laterality: N/A;  . Percutaneous coronary stent intervention (pci-s) N/A 06/13/2014    Procedure: PERCUTANEOUS CORONARY STENT INTERVENTION (PCI-S);  Surgeon: Sinclair Grooms, MD;  Location: [facility] CATH LAB;  Service: Cardiovascular;  Laterality: N/A;  . Left heart cath N/A 06/14/2014    Procedure: LEFT HEART CATH;  Surgeon: Sinclair Grooms, MD;  Location: [facility] CATH LAB;  Service: Cardiovascular;  Laterality: N/A;    Patient Active Problem List   Diagnosis Date Noted  . Gout 09/09/2014  . Medicare annual wellness visit, initial 09/09/2014  . Advance care planning 09/09/2014  . Ejection fraction   . Heme positive stool 06/19/2014  . Acute blood loss anemia 06/18/2014  . Long-term (current) use of anticoagulants   . RBBB (right bundle branch block)   . Carotid artery disease   . CAD (coronary artery disease)   . Hx of CABG   . History of gastrointestinal bleeding   . Mitral regurgitation   . ED (erectile dysfunction) 03/01/2012  . CKD (chronic kidney disease) stage 3, GFR 30-59 ml/min 03/24/2011  . Atrial flutter 08/24/2010  . Atrial fibrillation 08/09/2010  . Peripheral sensory neuropathy due to type 2 diabetes mellitus   . GERD   . Sleep apnea   . ANEMIA 09/13/2007  . Type 2 diabetes mellitus with peripheral neuropathy   . Hyperlipidemia   . Hypertensive heart disease     ROS  Patient denies fever, chills, headache, sweats, rash, change in vision, change in hearing, cough, nausea or vomiting, urinary symptoms. All other systems are reviewed and are negative.  PHYSICAL EXAM Patient is oriented to person time and place. Affect is  normal. He is here with his wife. He is overweight. His weight is stable. Head is atraumatic. Sclera and conjunctiva are normal and there is no jugular venous distention. Lungs are clear. Respiratory effort is nonlabored. Cardiac exam reveals S1 and S2. Abdomen is soft and there is no peripheral edema.  Filed Vitals:   10/29/14 1002  BP: 132/68  Pulse: 60  Height: 5\' 7"  (1.702 m)  Weight: 189 lb (85.73 kg)   EKG is done today and reviewed by me. He has sinus rhythm. There is old right bundle branch block with diffuse ST changes. There is no significant change.  ASSESSMENT & PLAN

## 2014-10-29 NOTE — Assessment & Plan Note (Addendum)
The patient is holding sinus rhythm. I want to get him back on his anticoagulation. However I then holding off because of his recent GI bleed and the fact that he is on aspirin and Brilinta since his last catheterization. Because the main symptom has been ongoing chest discomfort. I've been very hesitant to adjust his meds further and end up with triple drug therapy. No change in therapy today.

## 2014-10-31 ENCOUNTER — Ambulatory Visit: Payer: Medicare Other | Admitting: Cardiology

## 2014-10-31 ENCOUNTER — Other Ambulatory Visit (HOSPITAL_COMMUNITY): Payer: Self-pay | Admitting: *Deleted

## 2014-10-31 DIAGNOSIS — I6523 Occlusion and stenosis of bilateral carotid arteries: Secondary | ICD-10-CM

## 2014-11-01 ENCOUNTER — Other Ambulatory Visit: Payer: Self-pay | Admitting: Family Medicine

## 2014-11-18 ENCOUNTER — Other Ambulatory Visit: Payer: Self-pay | Admitting: *Deleted

## 2014-11-18 NOTE — Telephone Encounter (Deleted)
Erroneous encounter

## 2014-11-18 NOTE — Telephone Encounter (Signed)
Faxed refill request.  Looks like it was last filled 05/12/2014 by Dr. [name].  ? Quantity.  Please advise.

## 2014-11-19 DIAGNOSIS — H3530 Unspecified macular degeneration: Secondary | ICD-10-CM | POA: Diagnosis not present

## 2014-11-19 DIAGNOSIS — E119 Type 2 diabetes mellitus without complications: Secondary | ICD-10-CM | POA: Diagnosis not present

## 2014-11-19 DIAGNOSIS — H52223 Regular astigmatism, bilateral: Secondary | ICD-10-CM | POA: Diagnosis not present

## 2014-11-19 DIAGNOSIS — H5213 Myopia, bilateral: Secondary | ICD-10-CM | POA: Diagnosis not present

## 2014-11-19 LAB — HM DIABETES EYE EXAM

## 2014-11-19 NOTE — Telephone Encounter (Signed)
Let message on voicemail to call back.

## 2014-11-19 NOTE — Telephone Encounter (Signed)
Verify if he wants it to go to mail order and how often he is taking it so we can fix the quantity.  Thanks.

## 2014-11-20 NOTE — Telephone Encounter (Signed)
He does want it to go to mail order and he takes it as prescribed, basically 4 tabs per day.

## 2014-11-21 ENCOUNTER — Other Ambulatory Visit: Payer: Self-pay | Admitting: *Deleted

## 2014-11-21 MED ORDER — TRAMADOL HCL 50 MG PO TABS: 50.0000 mg | ORAL_TABLET | Freq: Four times a day (QID) | ORAL | Status: DC | PRN

## 2014-11-21 NOTE — Telephone Encounter (Signed)
Faxed

## 2014-11-21 NOTE — Telephone Encounter (Signed)
Printed, please fax in.  Thanks.  

## 2014-11-22 ENCOUNTER — Other Ambulatory Visit: Payer: Self-pay | Admitting: Cardiology

## 2014-11-27 ENCOUNTER — Ambulatory Visit (HOSPITAL_COMMUNITY): Payer: Medicare Other | Attending: Cardiovascular Disease | Admitting: Cardiology

## 2014-11-27 DIAGNOSIS — I6523 Occlusion and stenosis of bilateral carotid arteries: Secondary | ICD-10-CM

## 2014-11-27 DIAGNOSIS — Z951 Presence of aortocoronary bypass graft: Secondary | ICD-10-CM | POA: Insufficient documentation

## 2014-11-27 DIAGNOSIS — E119 Type 2 diabetes mellitus without complications: Secondary | ICD-10-CM | POA: Diagnosis not present

## 2014-11-27 DIAGNOSIS — I2510 Atherosclerotic heart disease of native coronary artery without angina pectoris: Secondary | ICD-10-CM | POA: Insufficient documentation

## 2014-11-27 DIAGNOSIS — Z87891 Personal history of nicotine dependence: Secondary | ICD-10-CM | POA: Diagnosis not present

## 2014-11-27 DIAGNOSIS — I10 Essential (primary) hypertension: Secondary | ICD-10-CM | POA: Diagnosis not present

## 2014-11-27 DIAGNOSIS — E785 Hyperlipidemia, unspecified: Secondary | ICD-10-CM | POA: Insufficient documentation

## 2014-11-27 DIAGNOSIS — R0989 Other specified symptoms and signs involving the circulatory and respiratory systems: Secondary | ICD-10-CM | POA: Insufficient documentation

## 2014-11-27 NOTE — Progress Notes (Signed)
Carotid duplex performed 

## 2014-12-10 ENCOUNTER — Ambulatory Visit (INDEPENDENT_AMBULATORY_CARE_PROVIDER_SITE_OTHER): Payer: Medicare Other | Admitting: Cardiology

## 2014-12-10 ENCOUNTER — Encounter: Payer: Self-pay | Admitting: Cardiology

## 2014-12-10 VITALS — BP 130/62 | HR 66 | Ht 68.0 in | Wt 187.8 lb

## 2014-12-10 DIAGNOSIS — Z8719 Personal history of other diseases of the digestive system: Secondary | ICD-10-CM

## 2014-12-10 DIAGNOSIS — I4892 Unspecified atrial flutter: Secondary | ICD-10-CM

## 2014-12-10 DIAGNOSIS — I25708 Atherosclerosis of coronary artery bypass graft(s), unspecified, with other forms of angina pectoris: Secondary | ICD-10-CM | POA: Diagnosis not present

## 2014-12-10 DIAGNOSIS — I6523 Occlusion and stenosis of bilateral carotid arteries: Secondary | ICD-10-CM

## 2014-12-10 NOTE — Patient Instructions (Signed)
**Note De-Identified Anitria Andon Obfuscation** Your physician recommends that you continue on your current medications as directed. Please refer to the Current Medication list given to you today.  Your physician recommends that you schedule a follow-up appointment in: 6 weeks

## 2014-12-10 NOTE — Assessment & Plan Note (Signed)
Coronary disease is stable. He is willing to undergo repeat catheterization. However I feel it is not appropriate at this time. He's having very limited symptoms. Continue same therapy.

## 2014-12-10 NOTE — Assessment & Plan Note (Signed)
For now he is on aspirin and Brilinta. Continue to follow him.

## 2014-12-10 NOTE — Assessment & Plan Note (Signed)
The patient has had some atrial arrhythmias. He continues to hold sinus rhythm. I do want to resume his anticoagulation. However this cannot be done safely until there is a period of time when aspirin and Brilinta can be held safely and he can have colonoscopy.

## 2014-12-10 NOTE — Progress Notes (Signed)
Cardiology Office Note   Date:  12/10/2014   ID:  Gioni, [name], [name], MRN [id number]  PCP:  [name], MD  Cardiologist:   Dola Argyle, MD   Chief Complaint  Patient presents with  . Appointment    Follow-up coronary artery disease      History of Present Illness: [name] is a 81 y.o. male who presents for follow-up of coronary disease. I saw him last October 29, 2014. In August, 2015 he had received a new stent. Since that time he has been on aspirin and Brilinta. There is a history of paroxysmal atrial fibrillation. He had been on Coumadin in the past. During his hospitalization in August, 2015 there was GI bleeding. He was transfused. Upper endoscopy revealed no obvious bleed. We have been waiting until it is safe to hold aspirin and Brilinta before proceeding with follow-up colonoscopy. In addition I've been very hasn't it to resume his Coumadin as it has been very important to keep him on dual antiplatelet therapy. He did have some chest discomfort after his hospitalization. However this has continued to improve. Since his last visit he had only chest discomfort on one occasion after moving some furniture.    Past Medical History  Diagnosis Date  . GERD (gastroesophageal reflux disease)   . Anemia     Secondary to acute blood loss  . Hypertension   . Hyperlipidemia   . Diabetes mellitus     Type II  . Diabetic peripheral neuropathy   . RBBB (right bundle branch block)   . Macular degeneration   . Sleep apnea   . Carotid artery disease     Doppler 09/18/2009 - 49% bilateral stenoses  . Personal history of colonic polyps   . Diverticulosis of colon with hemorrhage 2009    several unit diverticular bleed   . Erectile dysfunction     Mild  . PNA (pneumonia) 9/11    NSTEMI at [facility] with repeat cath, rec medical mgmt   . Atrial flutter 07/2010    September, 2011   Hospital with PNA and cath done.Marland KitchenMarland KitchenCoumadin.  Atrial flutter ablation planned, but  pt.  then had atrial fibrillation,/outpatient conversion 09/08/10..NSR..plan to follow..Dr. Caryl Comes  . Chronic kidney disease     ckd #3  . Gout   . Skin cancer     R lower leg, per derm 2012  . Atrial fibrillation     Consideration was given for atrial flutter ablation, but patient developed atrial fibrillation. Cardioversion was done. Dr. Caryl Comes decided to watch him clinically. November, 2011  . Carotid artery disease     49% bilateral, Doppler, November, 2010  . CAD (coronary artery disease)     Catheterization, September, 2011,  grafts patent from redo CABG,, medical therapy of coronary disease, consideration to proceeding with atrial flutter ablation  . Ejection fraction     EF 60%, echo, 2009  //   EF 65%, echo, September, 2011  . Mitral regurgitation     Mild, echo, September, 2011  . Shoulder pain     Decreasing Crestor did not affect the pain    Past Surgical History  Procedure Laterality Date  . Cardiac catheterization  2006    Nuclear..slight lateral ischemia..medical therapy  . Cardiac catheterization  08/04/2010    grafts patent from redo CABG...medical Rx and ablate Atrial flutter (LV not injected)   . Doppler echocardiography  08/2008    EF 60%  . Doppler echocardiography  08/02/2010  65-70%  . Doppler echocardiography  07/2010    MR mild  . Coronary artery bypass graft  1995     Redo  CABG 2006  . Polypectomy    . Cataract extraction    . Vasectomy    . Tonsillectomy    . Carotid endarterectomy    . Coronary artery bypass graft  1995, 2006  . Cholecystectomy  2008  . Esophagogastroduodenoscopy N/A 06/19/2014    Procedure: ESOPHAGOGASTRODUODENOSCOPY (EGD);  Surgeon: Jerene Bears, MD;  Location: [facility] ENDOSCOPY;  Service: Endoscopy;  Laterality: N/A;  . Left heart catheterization with coronary/graft angiogram N/A 06/11/2014    Procedure: LEFT HEART CATHETERIZATION WITH Beatrix Fetters;  Surgeon: Sinclair Grooms, MD;  Location: [facility] CATH LAB;  Service:  Cardiovascular;  Laterality: N/A;  . Percutaneous coronary stent intervention (pci-s) N/A 06/13/2014    Procedure: PERCUTANEOUS CORONARY STENT INTERVENTION (PCI-S);  Surgeon: Sinclair Grooms, MD;  Location: [facility] CATH LAB;  Service: Cardiovascular;  Laterality: N/A;  . Left heart cath N/A 06/14/2014    Procedure: LEFT HEART CATH;  Surgeon: Sinclair Grooms, MD;  Location: [facility] CATH LAB;  Service: Cardiovascular;  Laterality: N/A;     Current Outpatient Prescriptions  Medication Sig Dispense Refill  . amLODipine (NORVASC) 10 MG tablet TAKE 1 TABLET EVERY EVENING (Patient taking differently: TAKE 1 TABLET BY MOUTH EVERY EVENING) 90 tablet 0  . aspirin EC 81 MG EC tablet Take 1 tablet (81 mg total) by mouth daily.    . Cholecalciferol (VITAMIN D) 1000 UNITS capsule Take 2,000 Units by mouth daily.     . colchicine 0.6 MG tablet Take 0.6 mg by mouth 3 (three) times a week. For gout flare ups    . CRESTOR 20 MG tablet TAKE 1 TABLET DAILY (Patient taking differently: TAKE 1 TABLET BY MOUTH  DAILY) 90 tablet 3  . fish oil-omega-3 fatty acids 1000 MG capsule Take 1 g by mouth 2 (two) times daily.     . furosemide (LASIX) 80 MG tablet TAKE 1 TABLET DAILY (Patient taking differently: TAKE 1 TABLET BY MOUTH  DAILY) 90 tablet 0  . Insulin Syringe-Needle U-100 (INSULIN SYRINGE .3CC/31GX5/16") 31G X 5/16" 0.3 ML MISC Use daily as instructed.    . isosorbide mononitrate (IMDUR) 30 MG 24 hr tablet Take 60 mg (2 tablets) in the morning and 30 mg (1 tablet) in the evening on next refill (Patient taking differently: Take 60 mg (2 tablets) by mouth in the morning and 30 mg (1 tablet) by mouth in the evening on next refill) 270 tablet 1  . LANTUS 100 UNIT/ML injection INJECT 30 UNITS UNDER THE SKIN DAILY AS INSTRUCTED BY MD 4 vial 1  . magnesium oxide (MAG-OX) 400 MG tablet Take 400 mg by mouth daily.     . metoprolol succinate (TOPROL-XL) 50 MG 24 hr tablet TAKE 1 TABLET DAILY WITH A MEAL OR IMMEDIATELY FOLLOWING A MEAL  (Patient taking differently: TAKE 1 TABLET BY MOUTH DAILY WITH A MEAL OR IMMEDIATELY FOLLOWING A MEAL) 90 tablet 0  . Multiple Vitamin (MULTIVITAMIN WITH MINERALS) TABS Take 1 tablet by mouth daily.    . nitroGLYCERIN (NITROSTAT) 0.4 MG SL tablet Place 1 tablet (0.4 mg total) under the tongue every 5 (five) minutes as needed for chest pain. 90 tablet 1  . Potassium Gluconate (K-99) 595 MG CAPS Take 595 mg by mouth every evening.     . ticagrelor (BRILINTA) 90 MG TABS tablet Take 1 tablet (90 mg  total) by mouth 2 (two) times daily. 90 tablet 1  . traMADol (ULTRAM) 50 MG tablet Take 1 tablet (50 mg total) by mouth every 6 (six) hours as needed. ARTHRITIS 360 tablet 1   No current facility-administered medications for this visit.    Allergies:   Review of patient's allergies indicates no known allergies.    Social History:  The patient  reports that he quit smoking about 56 years ago. He has never used smokeless tobacco. He reports that he does not drink alcohol or use illicit drugs.   Family History:  The patient's family history includes Arthritis in his sister; Cancer in his sister; Diabetes in his mother and sister; Heart disease in his father and sister; Kidney disease in his mother; Stroke in his mother. There is no history of Prostate cancer or Colon cancer.    ROS:  Please see the history of present illness.  Patient denies fever, chills, headache, sweats, rash, change in vision, change in hearing, cough, nausea or vomiting, urinary symptoms.  All other systems are reviewed and negative.    PHYSICAL EXAM: VS:  BP 130/62 mmHg  Pulse 66  Ht 5\' 8"  (1.727 m)  Wt 187 lb 12.8 oz (85.186 kg)  BMI 28.56 kg/m2  SpO2 97% , BMI Body mass index is 28.56 kg/(m^2). GEN: Well nourished, well developed, in no acute distress He is overweight. Head is atraumatic. Sclera and conjunctiva are normal. There is no jugulovenous distention. Lungs are clear. Respiratory effort is nonlabored. Cardiac exam  reveals S1 and S2. The abdomen is soft. There is no peripheral edema. There are no musculoskeletal deformities. There are no skin rashes. Neurologic is grossly intact. The patient's wife is in the room.   EKG:  EKG is not ordered today.   Recent Labs: 06/08/2014: Pro B Natriuretic peptide (BNP) 2684.0* 06/09/2014: ALT 26 06/18/2014: Magnesium 2.2 07/03/2014: BUN 26*; Creatinine 2.0*; Potassium 3.2*; Sodium 139 09/11/2014: Hemoglobin 10.9*; Platelets 186.0    Lipid Panel    Component Value Date/Time   CHOL 102 09/01/2014 0945   TRIG 86.0 09/01/2014 0945   HDL 28.90* 09/01/2014 0945   CHOLHDL 4 09/01/2014 0945   VLDL 17.2 09/01/2014 0945   LDLCALC 56 09/01/2014 0945      Wt Readings from Last 3 Encounters:  12/10/14 187 lb 12.8 oz (85.186 kg)  10/29/14 189 lb (85.73 kg)  10/13/14 192 lb 12.8 oz (87.454 kg)       ASSESSMENT AND PLAN:   The problems and plans are listed at the bottom of this note.  Current medicines are reviewed at length with the patient today.  The patient does not have concerns regarding medicines.  The following changes have been made:  no change  Labs/ tests ordered today include: No orders of the defined types were placed in this encounter.     Disposition:   FU  6  weeks   Signed, Dola Argyle, MD  12/10/2014 11:15 AM    [address], [place], [place]  [address] Phone: [phone]; Fax: [phone]

## 2014-12-11 ENCOUNTER — Encounter: Payer: Self-pay | Admitting: Family Medicine

## 2014-12-15 ENCOUNTER — Encounter: Payer: Self-pay | Admitting: Family Medicine

## 2014-12-26 ENCOUNTER — Ambulatory Visit (INDEPENDENT_AMBULATORY_CARE_PROVIDER_SITE_OTHER): Payer: Medicare Other | Admitting: Family Medicine

## 2014-12-26 ENCOUNTER — Telehealth: Payer: Self-pay | Admitting: Family Medicine

## 2014-12-26 ENCOUNTER — Encounter: Payer: Self-pay | Admitting: Family Medicine

## 2014-12-26 VITALS — BP 144/66 | HR 65 | Temp 97.9°F | Wt 189.2 lb

## 2014-12-26 DIAGNOSIS — E119 Type 2 diabetes mellitus without complications: Secondary | ICD-10-CM | POA: Diagnosis not present

## 2014-12-26 DIAGNOSIS — N183 Chronic kidney disease, stage 3 unspecified: Secondary | ICD-10-CM

## 2014-12-26 DIAGNOSIS — E1142 Type 2 diabetes mellitus with diabetic polyneuropathy: Secondary | ICD-10-CM

## 2014-12-26 DIAGNOSIS — I6523 Occlusion and stenosis of bilateral carotid arteries: Secondary | ICD-10-CM

## 2014-12-26 DIAGNOSIS — I25708 Atherosclerosis of coronary artery bypass graft(s), unspecified, with other forms of angina pectoris: Secondary | ICD-10-CM

## 2014-12-26 DIAGNOSIS — E1140 Type 2 diabetes mellitus with diabetic neuropathy, unspecified: Secondary | ICD-10-CM

## 2014-12-26 LAB — RENAL FUNCTION PANEL
ALBUMIN: 4.5 g/dL (ref 3.5–5.2)
BUN: 31 mg/dL — ABNORMAL HIGH (ref 6–23)
CO2: 33 mEq/L — ABNORMAL HIGH (ref 19–32)
Calcium: 10.4 mg/dL (ref 8.4–10.5)
Chloride: 100 mEq/L (ref 96–112)
Creatinine, Ser: 2.07 mg/dL — ABNORMAL HIGH (ref 0.40–1.50)
GFR: 32.93 mL/min — ABNORMAL LOW (ref >60.00)
GLUCOSE: 142 mg/dL — AB (ref 70–99)
PHOSPHORUS: 3.7 mg/dL (ref 2.3–4.6)
POTASSIUM: 3.9 meq/L (ref 3.5–5.1)
Sodium: 139 mEq/L (ref 135–145)

## 2014-12-26 LAB — MICROALBUMIN / CREATININE URINE RATIO
Creatinine,U: 41.4 mg/dL
MICROALB UR: 2.7 mg/dL — AB (ref 0.0–1.9)
Microalb Creat Ratio: 6.5 mg/g (ref 0.0–30.0)

## 2014-12-26 LAB — HEMOGLOBIN A1C: HEMOGLOBIN A1C: 7 % — AB (ref 4.6–6.5)

## 2014-12-26 MED ORDER — "INSULIN SYRINGE 31G X 5/16"" 0.3 ML MISC": Status: DC

## 2014-12-26 NOTE — Patient Instructions (Signed)
Go to the lab on the way out.  We'll contact you with your lab report.  Drop off your DMV forms and I'll work on them.  Take care.  Glad to see you.   Let's get back together in about 4 months.  We can do labs at the visit if needed.

## 2014-12-26 NOTE — Telephone Encounter (Signed)
Pt dropped off forms from the DMV to be filled out.  Placing on Lugene's desk.

## 2014-12-26 NOTE — Telephone Encounter (Signed)
In Dr. Josefine Class In [facility].

## 2014-12-26 NOTE — Progress Notes (Signed)
Pre visit review using our clinic review tool, if applicable. No additional management support is needed unless otherwise documented below in the visit note.  Diabetes:  Using medications without difficulties:yes Hypoglycemic episodes: no Hyperglycemic episodes:no Feet problems: at baseline, see below  Blood Sugars averaging: ~140 in Am  CAD s/p cath and still with some mild CP with exertion, he describes this is stable since the cath, not worse in the meantime.  He clearly was improved after cath, but not pain free per his report.  He has used 2-3 NTG per month, not increasing.  He can walk about 2 blocks before getting angina, again stable per patient.  Sx resolve with rest.  Compliant with meds.    He'll have f/u with renal in the near future.  Labs drawn today.    PMH and SH reviewed  Meds, vitals, and allergies reviewed.   ROS: See HPI.  Otherwise negative.    GEN: nad, alert and oriented HEENT: mucous membranes moist NECK: supple w/o LA CV: rrr. PULM: ctab, no inc wob ABD: soft, +bs EXT: no edema SKIN: no acute rash  Diabetic foot exam: Normal inspection No skin breakdown No calluses  Normal DP pulses Normal sensation to light touch and monofilament (he has tingling in the feet in the AMs but foot exam is okay now at OV) Nails normal

## 2014-12-28 ENCOUNTER — Encounter: Payer: Self-pay | Admitting: Family Medicine

## 2014-12-28 NOTE — Assessment & Plan Note (Signed)
I'll check with cards about inc in imdur vs possible addition of another agent, ie possibly ranexa.  No change in meds today.  He describes his sx as improved at cath, then stable since then, but still with exertional angina.  CP free at exam. >25 minutes spent in face to face time with patient, >50% spent in counselling or coordination of care.

## 2014-12-28 NOTE — Assessment & Plan Note (Signed)
Continue as is for now, see notes on labs.

## 2014-12-28 NOTE — Telephone Encounter (Signed)
I'll work on the hard copy.  Thanks.  

## 2014-12-28 NOTE — Assessment & Plan Note (Signed)
See notes on labs.  He has f/u with renal pending.

## 2014-12-29 ENCOUNTER — Encounter: Payer: Self-pay | Admitting: *Deleted

## 2015-01-01 ENCOUNTER — Telehealth: Payer: Self-pay | Admitting: Family Medicine

## 2015-01-01 MED ORDER — ISOSORBIDE MONONITRATE ER 30 MG PO TB24: 60.0000 mg | ORAL_TABLET | Freq: Every day | ORAL | Status: DC

## 2015-01-01 NOTE — Telephone Encounter (Signed)
Call pt.  I talked with cards.  Inc imdur to 60mg  bid and see how he feels.  Update [place] in about 1 week, sooner if needed. Thanks.

## 2015-01-01 NOTE — Telephone Encounter (Signed)
Patient advised.

## 2015-01-06 ENCOUNTER — Other Ambulatory Visit: Payer: Self-pay | Admitting: *Deleted

## 2015-01-06 MED ORDER — TICAGRELOR 90 MG PO TABS: 90.0000 mg | ORAL_TABLET | Freq: Two times a day (BID) | ORAL | Status: DC

## 2015-01-10 ENCOUNTER — Other Ambulatory Visit: Payer: Self-pay | Admitting: Cardiology

## 2015-01-15 ENCOUNTER — Telehealth: Payer: Self-pay

## 2015-01-15 NOTE — Telephone Encounter (Signed)
Pt left v/m; pt has appt with Dr Posey Pronto at [facility] on 01/16/15 and pt request 12/26/14 labs faxed to Dr Posey Pronto; spoke with Lattie Haw at [facility] and verified fax # [phone]. 12/26/14 lab results faxed as requested and pt notified done.

## 2015-01-16 DIAGNOSIS — D631 Anemia in chronic kidney disease: Secondary | ICD-10-CM | POA: Diagnosis not present

## 2015-01-16 DIAGNOSIS — E889 Metabolic disorder, unspecified: Secondary | ICD-10-CM | POA: Diagnosis not present

## 2015-01-16 DIAGNOSIS — N183 Chronic kidney disease, stage 3 (moderate): Secondary | ICD-10-CM | POA: Diagnosis not present

## 2015-01-16 DIAGNOSIS — I10 Essential (primary) hypertension: Secondary | ICD-10-CM | POA: Diagnosis not present

## 2015-02-02 ENCOUNTER — Other Ambulatory Visit: Payer: Self-pay | Admitting: Cardiology

## 2015-02-06 ENCOUNTER — Encounter: Payer: Self-pay | Admitting: Cardiology

## 2015-02-06 ENCOUNTER — Ambulatory Visit (INDEPENDENT_AMBULATORY_CARE_PROVIDER_SITE_OTHER): Payer: Medicare Other | Admitting: Cardiology

## 2015-02-06 VITALS — BP 132/62 | HR 68 | Ht 68.0 in | Wt 189.2 lb

## 2015-02-06 DIAGNOSIS — I25118 Atherosclerotic heart disease of native coronary artery with other forms of angina pectoris: Secondary | ICD-10-CM

## 2015-02-06 DIAGNOSIS — Z8719 Personal history of other diseases of the digestive system: Secondary | ICD-10-CM

## 2015-02-06 DIAGNOSIS — N183 Chronic kidney disease, stage 3 unspecified: Secondary | ICD-10-CM

## 2015-02-06 DIAGNOSIS — I6523 Occlusion and stenosis of bilateral carotid arteries: Secondary | ICD-10-CM | POA: Diagnosis not present

## 2015-02-06 DIAGNOSIS — Z951 Presence of aortocoronary bypass graft: Secondary | ICD-10-CM | POA: Diagnosis not present

## 2015-02-06 DIAGNOSIS — I4892 Unspecified atrial flutter: Secondary | ICD-10-CM

## 2015-02-06 NOTE — Assessment & Plan Note (Signed)
Patient had a redo CABG in 2006.  As part of today's evaluation I spent greater than 25 minutes with his total care. More than half of this time was with an extensive discussion talking about his prior GI bleeding and the approach to his current care.

## 2015-02-06 NOTE — Assessment & Plan Note (Signed)
The patient has had both atrial fib and atrial flutter in the past. However he is held sinus rhythm for a prolonged period of time. It is still important to try to anticoagulate him at some point. However, he is on dual antiplatelet therapy for complicated intervention in August, 2015. I'm very hasn't to use 3 drugs. I'm also very hasn't tobacco away from his dual antiplatelet therapy for his coronaries. This is especially true since he still has some angina. Again today we have discussed whether we should proceed with catheterization. He has renal insufficiency and has had some renal changes at the time of his caths in the past. Since he is not having marked symptoms. I feel it is most prudent not to proceed with catheterization at this time.

## 2015-02-06 NOTE — Assessment & Plan Note (Signed)
We need to be very careful with his renal dysfunction. This is an additional reason why do not want to proceed with cath at this time.

## 2015-02-06 NOTE — Patient Instructions (Signed)
Your physician recommends that you continue on your current medications as directed. Please refer to the Current Medication list given to you today.  Your physician wants you to follow-up in: 3 months. You will receive a reminder letter in the mail two months in advance. If you don't receive a letter, please call our office to schedule the follow-up appointment.  

## 2015-02-06 NOTE — Assessment & Plan Note (Signed)
The plan since his GI bleed in August, 2015 has been to proceed with colonoscopy when we can. After review of the history, I feel more comfortable that it is still reasonable to delay further concerning a colonoscopy.

## 2015-02-06 NOTE — Assessment & Plan Note (Signed)
I outlined the thoughts in the lines above. For now we will continue to follow him and not proceed with repeat catheterization.

## 2015-02-06 NOTE — Progress Notes (Signed)
Cardiology Office Note   Date:  02/06/2015   ID:  Jeremiah, [name] 07/04/1932, MRN [id number]  PCP:  [name], MD  Cardiologist:  Dola Argyle, MD   Chief Complaint  Patient presents with  . Appointment    Follow-up coronary artery disease      History of Present Illness: [name] is a 81 y.o. male who presents today to follow-up coronary artery disease. As part of today's evaluation, I have spent an extensive amount of time reviewing reviewing records around the time of his intervention in August, 2015. He had a hemoglobin drop to 7.5 at that time. Endoscopy showed no significant abnormalities. Historically he has had a diverticular bleed in the past. Colonoscopy was not done because of the need for dual antiplatelet therapy associated with the acute coronary syndrome at that time. The patient also relates that he had very significant posterior nasal bleeding during that hospitalization that he did not report. It is possible that he had a hemoglobin drop related to this. This information plays a role in the decisions to be made about how hard to push for colonoscopy..  The patient is not having any significant chest pain. He says that he is limiting himself. However he is able to mow several yards with a riding mower's. When he puts his equipment on his trailer, he takes his time including using a hand wench. He is not having any chest pain with this.  Past Medical History  Diagnosis Date  . GERD (gastroesophageal reflux disease)   . Anemia     Secondary to acute blood loss  . Hypertension   . Hyperlipidemia   . Diabetes mellitus     Type II  . Diabetic peripheral neuropathy   . RBBB (right bundle branch block)   . Macular degeneration   . Sleep apnea   . Carotid artery disease     Doppler 09/18/2009 - 49% bilateral stenoses  . Personal history of colonic polyps   . Diverticulosis of colon with hemorrhage 2009    several unit diverticular bleed   . Erectile  dysfunction     Mild  . PNA (pneumonia) 9/11    NSTEMI at [facility] with repeat cath, rec medical mgmt   . Atrial flutter 07/2010    September, 2011   Hospital with PNA and cath done.Marland KitchenMarland KitchenCoumadin.  Atrial flutter ablation planned, but  pt. then had atrial fibrillation,/outpatient conversion 09/08/10..NSR..plan to follow..Dr. Caryl Comes  . Chronic kidney disease     ckd #3  . Gout   . Skin cancer     R lower leg, per derm 2012  . Atrial fibrillation     Consideration was given for atrial flutter ablation, but patient developed atrial fibrillation. Cardioversion was done. Dr. Caryl Comes decided to watch him clinically. November, 2011  . Carotid artery disease     49% bilateral, Doppler, November, 2010  . CAD (coronary artery disease)     Catheterization, September, 2011,  grafts patent from redo CABG,, medical therapy of coronary disease, consideration to proceeding with atrial flutter ablation  . Ejection fraction     EF 60%, echo, 2009  //   EF 65%, echo, September, 2011  . Mitral regurgitation     Mild, echo, September, 2011  . Shoulder pain     Decreasing Crestor did not affect the pain    Past Surgical History  Procedure Laterality Date  . Cardiac catheterization  2006    Nuclear..slight lateral ischemia..medical therapy  .  Cardiac catheterization  08/04/2010    grafts patent from redo CABG...medical Rx and ablate Atrial flutter (LV not injected)   . Doppler echocardiography  08/2008    EF 60%  . Doppler echocardiography  08/02/2010    65-70%  . Doppler echocardiography  07/2010    MR mild  . Coronary artery bypass graft  1995     Redo  CABG 2006  . Polypectomy    . Cataract extraction    . Vasectomy    . Tonsillectomy    . Carotid endarterectomy    . Coronary artery bypass graft  1995, 2006  . Cholecystectomy  2008  . Esophagogastroduodenoscopy N/A 06/19/2014    Procedure: ESOPHAGOGASTRODUODENOSCOPY (EGD);  Surgeon: Jerene Bears, MD;  Location: [facility] ENDOSCOPY;  Service: Endoscopy;   Laterality: N/A;  . Left heart catheterization with coronary/graft angiogram N/A 06/11/2014    Procedure: LEFT HEART CATHETERIZATION WITH Beatrix Fetters;  Surgeon: Sinclair Grooms, MD;  Location: [facility] CATH LAB;  Service: Cardiovascular;  Laterality: N/A;  . Percutaneous coronary stent intervention (pci-s) N/A 06/13/2014    Procedure: PERCUTANEOUS CORONARY STENT INTERVENTION (PCI-S);  Surgeon: Sinclair Grooms, MD;  Location: [facility] CATH LAB;  Service: Cardiovascular;  Laterality: N/A;  . Left heart cath N/A 06/14/2014    Procedure: LEFT HEART CATH;  Surgeon: Sinclair Grooms, MD;  Location: [facility] CATH LAB;  Service: Cardiovascular;  Laterality: N/A;    Patient Active Problem List   Diagnosis Date Noted  . Gout 09/09/2014  . Medicare annual wellness visit, initial 09/09/2014  . Advance care planning 09/09/2014  . Ejection fraction   . Heme positive stool 06/19/2014  . Long-term (current) use of anticoagulants   . RBBB (right bundle branch block)   . Carotid artery disease   . CAD (coronary artery disease)   . Hx of CABG   . History of gastrointestinal bleeding   . Mitral regurgitation   . ED (erectile dysfunction) 03/01/2012  . CKD (chronic kidney disease) stage 3, GFR 30-59 ml/min 03/24/2011  . Atrial flutter 08/24/2010  . Paroxysmal atrial fibrillation 08/09/2010  . Peripheral sensory neuropathy due to type 2 diabetes mellitus   . GERD   . Sleep apnea   . Type 2 diabetes mellitus with peripheral neuropathy   . Hyperlipidemia   . Hypertensive heart disease       Current Outpatient Prescriptions  Medication Sig Dispense Refill  . amLODipine (NORVASC) 10 MG tablet TAKE 1 TABLET EVERY EVENING 90 tablet 1  . aspirin EC 81 MG EC tablet Take 1 tablet (81 mg total) by mouth daily.    . Cholecalciferol (VITAMIN D) 1000 UNITS capsule Take 2,000 Units by mouth daily.     . colchicine 0.6 MG tablet Take 0.6 mg by mouth 3 (three) times a week. For gout flare ups    . CRESTOR 20 MG  tablet TAKE 1 TABLET DAILY (Patient taking differently: TAKE 1 TABLET BY MOUTH  DAILY) 90 tablet 3  . fish oil-omega-3 fatty acids 1000 MG capsule Take 1 g by mouth 2 (two) times daily.     . furosemide (LASIX) 80 MG tablet TAKE 1 TABLET DAILY 90 tablet 1  . Insulin Syringe-Needle U-100 (INSULIN SYRINGE .3CC/31GX5/16") 31G X 5/16" 0.3 ML MISC Use daily as instructed. 100 each 3  . isosorbide mononitrate (IMDUR) 30 MG 24 hr tablet Take 2 tablets (60 mg total) by mouth daily.    [name] LANTUS 100 UNIT/ML injection INJECT 30 UNITS UNDER THE  SKIN DAILY AS INSTRUCTED BY MD 4 vial 1  . magnesium oxide (MAG-OX) 400 MG tablet Take 400 mg by mouth daily.     . metoprolol succinate (TOPROL-XL) 50 MG 24 hr tablet TAKE 1 TABLET DAILY WITH A MEAL OR IMMEDIATELY FOLLOWING A MEAL 90 tablet 1  . Multiple Vitamin (MULTIVITAMIN WITH MINERALS) TABS Take 1 tablet by mouth daily.    . nitroGLYCERIN (NITROSTAT) 0.4 MG SL tablet Place 1 tablet (0.4 mg total) under the tongue every 5 (five) minutes as needed for chest pain. 90 tablet 1  . Potassium Gluconate (K-99) 595 MG CAPS Take 595 mg by mouth every evening.     . ticagrelor (BRILINTA) 90 MG TABS tablet Take 1 tablet (90 mg total) by mouth 2 (two) times daily. 180 tablet 1  . traMADol (ULTRAM) 50 MG tablet Take 1 tablet (50 mg total) by mouth every 6 (six) hours as needed. ARTHRITIS 360 tablet 1   No current facility-administered medications for this visit.    Allergies:   Review of patient's allergies indicates no known allergies.    Social History:  The patient  reports that he quit smoking about 56 years ago. He has never used smokeless tobacco. He reports that he does not drink alcohol or use illicit drugs.   Family History:  The patient's family history includes Arthritis in his sister; Cancer in his sister; Diabetes in his mother and sister; Heart disease in his father and sister; Kidney disease in his mother; Stroke in his mother. There is no history of Prostate  cancer or Colon cancer.    ROS:  Please see the history of present illness.  Patient denies fever, chills, headache, sweats, rash, change in vision, change in hearing, chest pain, cough, nausea or vomiting, urinary symptoms. All other systems are reviewed and are negative.      PHYSICAL EXAM: VS:  BP 132/62 mmHg  Pulse 68  Ht 5\' 8"  (1.727 m)  Wt 189 lb 3.2 oz (85.821 kg)  BMI 28.77 kg/m2 , Patient is oriented to person time and place. Affect is normal. He is here with his wife. Head is atraumatic. Sclera and conjunctiva are normal. There is no jugulovenous distention. Lungs are clear. Respiratory effort is not labored. Cardiac exam reveals an S1 and S2. The abdomen is soft. There is no peripheral edema. There are no musculoskeletal deformities. There are no skin rashes. Neurologic is grossly intact.  EKG:   EKG is not done today.   Recent Labs: 06/08/2014: Pro B Natriuretic peptide (BNP) 2684.0* 06/09/2014: ALT 26 06/18/2014: Magnesium 2.2 09/11/2014: Hemoglobin 10.9*; Platelets 186.0 12/26/2014: BUN 31*; Creatinine 2.07*; Potassium 3.9; Sodium 139    Lipid Panel    Component Value Date/Time   CHOL 102 09/01/2014 0945   TRIG 86.0 09/01/2014 0945   HDL 28.90* 09/01/2014 0945   CHOLHDL 4 09/01/2014 0945   VLDL 17.2 09/01/2014 0945   LDLCALC 56 09/01/2014 0945      Wt Readings from Last 3 Encounters:  02/06/15 189 lb 3.2 oz (85.821 kg)  12/26/14 189 lb 4 oz (85.843 kg)  12/10/14 187 lb 12.8 oz (85.186 kg)      Current medicines are reviewed   The patient understands his medicines well.     ASSESSMENT AND PLAN:

## 2015-03-01 ENCOUNTER — Other Ambulatory Visit: Payer: Self-pay | Admitting: Cardiology

## 2015-04-12 ENCOUNTER — Other Ambulatory Visit: Payer: Self-pay | Admitting: Family Medicine

## 2015-04-28 ENCOUNTER — Ambulatory Visit (INDEPENDENT_AMBULATORY_CARE_PROVIDER_SITE_OTHER): Payer: Medicare Other | Admitting: Family Medicine

## 2015-04-28 ENCOUNTER — Encounter: Payer: Self-pay | Admitting: Family Medicine

## 2015-04-28 VITALS — BP 136/64 | HR 66 | Temp 98.4°F | Wt 189.8 lb

## 2015-04-28 DIAGNOSIS — E1140 Type 2 diabetes mellitus with diabetic neuropathy, unspecified: Secondary | ICD-10-CM

## 2015-04-28 DIAGNOSIS — E119 Type 2 diabetes mellitus without complications: Secondary | ICD-10-CM

## 2015-04-28 DIAGNOSIS — Z23 Encounter for immunization: Secondary | ICD-10-CM | POA: Diagnosis not present

## 2015-04-28 DIAGNOSIS — I6523 Occlusion and stenosis of bilateral carotid arteries: Secondary | ICD-10-CM | POA: Diagnosis not present

## 2015-04-28 DIAGNOSIS — E1142 Type 2 diabetes mellitus with diabetic polyneuropathy: Secondary | ICD-10-CM

## 2015-04-28 LAB — HEMOGLOBIN A1C: Hgb A1c MFr Bld: 6.7 % — ABNORMAL HIGH (ref 4.6–6.5)

## 2015-04-28 MED ORDER — ISOSORBIDE MONONITRATE ER 30 MG PO TB24: 60.0000 mg | ORAL_TABLET | Freq: Two times a day (BID) | ORAL | Status: DC

## 2015-04-28 MED ORDER — INSULIN GLARGINE 100 UNIT/ML ~~LOC~~ SOLN: SUBCUTANEOUS | Status: DC

## 2015-04-28 NOTE — Patient Instructions (Signed)
Go to the lab on the way out.  We'll contact you with your lab report. Take care.  Glad to see you.  Schedule a physical in about 4 months, labs ahead of time.

## 2015-04-28 NOTE — Progress Notes (Signed)
Pre visit review using our clinic review tool, if applicable. No additional management support is needed unless otherwise documented below in the visit note.  Exercise capacity improved with higher dose of imdur.  Diet is fair, cutting back on chips.  Some occ burning sensation in the feet but not worsening.  Taking 25 units daily.  Sugars usually ~140 in Am. No lows.  A1c pending.  Feels well o/w.   Meds, vitals, and allergies reviewed.   ROS: See HPI.  Otherwise, noncontributory.  GEN: nad, alert and oriented HEENT: mucous membranes moist NECK: supple w/o LA CV: rrr.  no murmur PULM: ctab, no inc wob ABD: soft, +bs EXT: no edema SKIN: no acute rash

## 2015-04-30 NOTE — Assessment & Plan Note (Signed)
A1c improved, continue as is.  Recheck at physical in about 4 months, labs ahead of time. See notes on labs.

## 2015-05-07 ENCOUNTER — Other Ambulatory Visit: Payer: Self-pay | Admitting: *Deleted

## 2015-05-07 MED ORDER — GLUCOSE BLOOD VI STRP: ORAL_STRIP | Status: DC

## 2015-05-11 ENCOUNTER — Telehealth: Payer: Self-pay | Admitting: *Deleted

## 2015-05-11 NOTE — Telephone Encounter (Signed)
Received prior authorization paperwork from [employer] to be completed and faxed back. Paperwork is on your desk. Fax # on form is [phone]

## 2015-05-11 NOTE — Telephone Encounter (Signed)
It won't be approved.  Will need to either pay out of pocket or change to freestyle lite or precision xtra.  See what patient wants to do.  Thanks.

## 2015-05-12 NOTE — Telephone Encounter (Signed)
Faxed form to [phone].  Left detailed message on patient's voicemail re: Dr. Josefine Class comment.

## 2015-05-14 NOTE — Telephone Encounter (Signed)
Patient states he will check with his insurance company and see if he needs to change meters/strips and will let us know.

## 2015-05-22 ENCOUNTER — Other Ambulatory Visit: Payer: Self-pay | Admitting: *Deleted

## 2015-05-22 MED ORDER — GLUCOSE BLOOD VI STRP: ORAL_STRIP | Status: DC

## 2015-05-22 MED ORDER — FREESTYLE LITE DEVI: Status: DC

## 2015-05-22 MED ORDER — NITROGLYCERIN 0.4 MG SL SUBL: 0.4000 mg | SUBLINGUAL_TABLET | SUBLINGUAL | Status: DC | PRN

## 2015-05-22 NOTE — Telephone Encounter (Signed)
Faxed refill request. Last Filled:     360 tablet 1 RF on 11/21/2014  Please advise.

## 2015-05-24 MED ORDER — TRAMADOL HCL 50 MG PO TABS: 50.0000 mg | ORAL_TABLET | Freq: Four times a day (QID) | ORAL | Status: DC | PRN

## 2015-05-24 NOTE — Telephone Encounter (Signed)
Printed, please fax in.  Thanks.  

## 2015-05-25 NOTE — Telephone Encounter (Signed)
Rx faxed to [employer].

## 2015-06-05 DIAGNOSIS — X32XXXD Exposure to sunlight, subsequent encounter: Secondary | ICD-10-CM | POA: Diagnosis not present

## 2015-06-05 DIAGNOSIS — C44529 Squamous cell carcinoma of skin of other part of trunk: Secondary | ICD-10-CM | POA: Diagnosis not present

## 2015-06-05 DIAGNOSIS — L570 Actinic keratosis: Secondary | ICD-10-CM | POA: Diagnosis not present

## 2015-06-05 DIAGNOSIS — C44310 Basal cell carcinoma of skin of unspecified parts of face: Secondary | ICD-10-CM | POA: Diagnosis not present

## 2015-06-23 ENCOUNTER — Other Ambulatory Visit: Payer: Self-pay | Admitting: Cardiology

## 2015-06-24 NOTE — Telephone Encounter (Signed)
It looks like pcp, Dr Damita Dunnings, changed patients dose to 60mg  bid. Should the refill be deferred to his office? Please advise. Thanks, MI

## 2015-06-25 ENCOUNTER — Other Ambulatory Visit: Payer: Self-pay

## 2015-06-25 MED ORDER — ISOSORBIDE MONONITRATE ER 30 MG PO TB24: 60.0000 mg | ORAL_TABLET | Freq: Two times a day (BID) | ORAL | Status: DC

## 2015-06-25 NOTE — Telephone Encounter (Signed)
OK to approve the refill

## 2015-07-08 DIAGNOSIS — L570 Actinic keratosis: Secondary | ICD-10-CM | POA: Diagnosis not present

## 2015-07-08 DIAGNOSIS — Z85828 Personal history of other malignant neoplasm of skin: Secondary | ICD-10-CM | POA: Diagnosis not present

## 2015-07-08 DIAGNOSIS — Z08 Encounter for follow-up examination after completed treatment for malignant neoplasm: Secondary | ICD-10-CM | POA: Diagnosis not present

## 2015-07-08 DIAGNOSIS — X32XXXD Exposure to sunlight, subsequent encounter: Secondary | ICD-10-CM | POA: Diagnosis not present

## 2015-07-09 ENCOUNTER — Other Ambulatory Visit: Payer: Self-pay | Admitting: Cardiology

## 2015-07-19 ENCOUNTER — Other Ambulatory Visit: Payer: Self-pay | Admitting: Cardiology

## 2015-07-25 IMAGING — CR DG CHEST 1V PORT
1 series · 1 of 1 positions shown · non-contrast
Comparison: 06/08/2014

CLINICAL DATA: Increasing chest pain with shortness of Breath

EXAM:
PORTABLE CHEST - 1 VIEW

[AP]
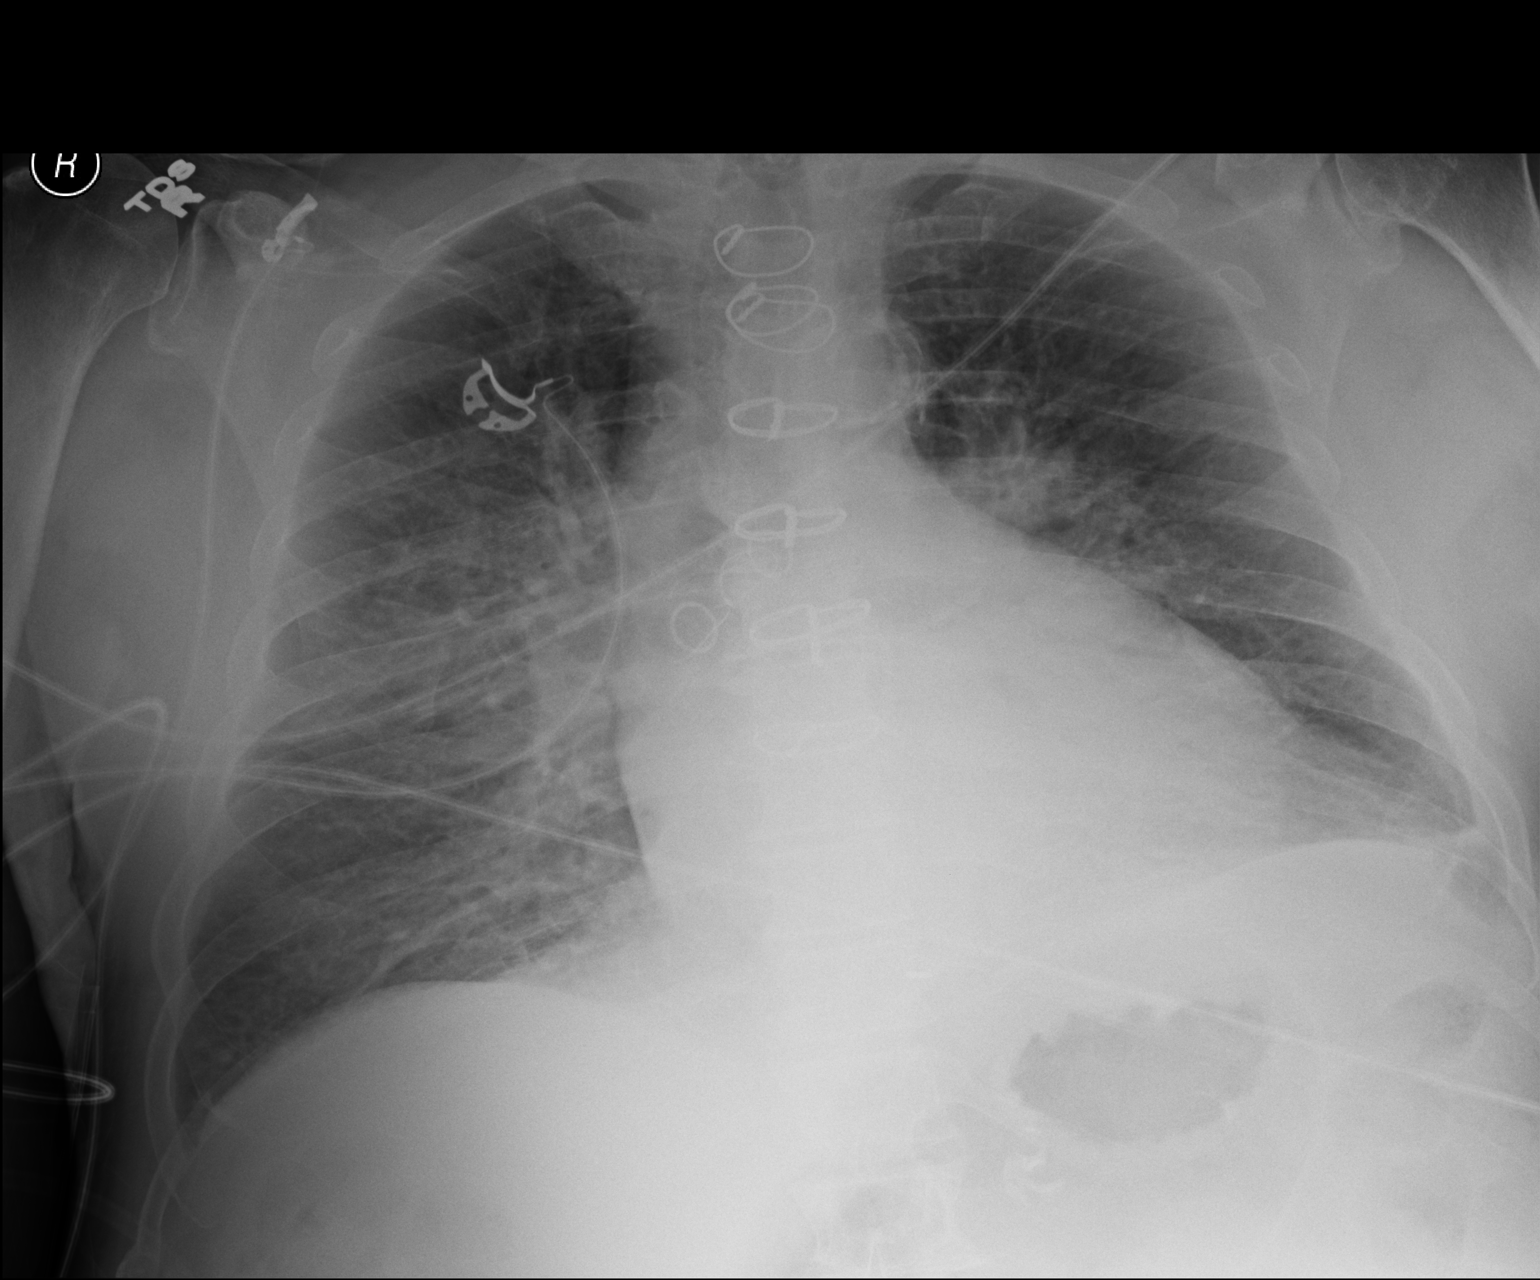

[1 of 1 positions shown; findings below may reference images not displayed]

FINDINGS: The cardiac shadow is stable. Postoperative changes are again noted.
The left lung remains clear. Vascular congestion and diffuse
increased density in the right mid lung is noted. Although this may
represent a component of pulmonary edema the possibility of an
underlying pulmonic infiltrate could not be totally excluded.
IMPRESSION: Findings consistent with vascular congestion and likely unilateral
pulmonary edema.

## 2015-07-28 ENCOUNTER — Telehealth: Payer: Self-pay | Admitting: Family Medicine

## 2015-07-28 NOTE — Telephone Encounter (Signed)
Please see what he needs and where he needs it sent.  Thanks.

## 2015-07-28 NOTE — Telephone Encounter (Signed)
Pt needs a new rx for his sleep apnea supplies.  There is a new provider for these supplies.  He is asking for a call back to discuss this.  [phone]

## 2015-07-29 NOTE — Telephone Encounter (Signed)
Was using [employer] but they lost contract. Now has to use [employer]; full face mask (Large), Head straps, tubing and new CPAP machine (his is 54+ years old).

## 2015-07-30 NOTE — Telephone Encounter (Signed)
Please send order for full face mask (Large), Head straps, tubing and new CPAP machine since his is >79 years old.  Dx G47.30.  Please tell patient that the coverage for this is sometimes dependent on having a visit re: OSA within 30 or 90 days of the order.   I'm glad to sign off on the order, but we may need to have a visit to get it covered.   This isn't my requirement, but tell him as a FYI.  I last saw him on 04/28/15.   Thanks.

## 2015-08-03 NOTE — Telephone Encounter (Signed)
Spoke to representative at [employer] and was advised that they need an order, a copy of the sleep study, demographics and insurance faxed to [phone].  Spoke to [place] in Medical Records at [phone] and was advised that she will have to request the paper chart from the warehouse which will take a couple of days. Almyra Free stated that when she receives the paper chart she will see if the sleep study is in the chart and will notify the office  Almyra Free stated that if she is able to locate the sleep study she will scan it into the chart and either way she will call the office back and let Dr. Damita Dunnings know what she finds out.

## 2015-08-03 NOTE — Telephone Encounter (Addendum)
Noted, will await input.  If patient isn't aware of the pulmonary record request, then let me know about the potential delay.  Thanks.

## 2015-08-03 NOTE — Telephone Encounter (Signed)
Pt left v/m returning call and requesting cb. 

## 2015-08-03 NOTE — Telephone Encounter (Signed)
Patient notified as instructed by telephone and verbalized understanding. Patient stated that his contact at [facility] is Doyle at [phone]

## 2015-08-03 NOTE — Telephone Encounter (Signed)
Left message on voicemail to call back. Need contact number and person for [employer].

## 2015-08-06 ENCOUNTER — Encounter: Payer: Self-pay | Admitting: Pulmonary Disease

## 2015-08-06 NOTE — Telephone Encounter (Signed)
Pt came in checking status on records from sleep study

## 2015-08-10 ENCOUNTER — Encounter: Payer: Self-pay | Admitting: Cardiology

## 2015-08-10 ENCOUNTER — Ambulatory Visit (INDEPENDENT_AMBULATORY_CARE_PROVIDER_SITE_OTHER): Payer: Medicare Other | Admitting: Cardiology

## 2015-08-10 VITALS — BP 136/70 | HR 62 | Ht 68.0 in | Wt 193.6 lb

## 2015-08-10 DIAGNOSIS — I6523 Occlusion and stenosis of bilateral carotid arteries: Secondary | ICD-10-CM

## 2015-08-10 DIAGNOSIS — I4892 Unspecified atrial flutter: Secondary | ICD-10-CM

## 2015-08-10 DIAGNOSIS — I480 Paroxysmal atrial fibrillation: Secondary | ICD-10-CM

## 2015-08-10 DIAGNOSIS — I779 Disorder of arteries and arterioles, unspecified: Secondary | ICD-10-CM | POA: Diagnosis not present

## 2015-08-10 DIAGNOSIS — I25799 Atherosclerosis of other coronary artery bypass graft(s) with unspecified angina pectoris: Secondary | ICD-10-CM

## 2015-08-10 DIAGNOSIS — Z8719 Personal history of other diseases of the digestive system: Secondary | ICD-10-CM

## 2015-08-10 DIAGNOSIS — I739 Peripheral vascular disease, unspecified: Secondary | ICD-10-CM

## 2015-08-10 NOTE — Assessment & Plan Note (Signed)
The patient is had both atrial flutter and atrial fibrillation in the past. During his hospitalization in August, 2015 with multiple catheterizations and stents, his hemoglobin dropped to 7.5. Aspirin and Brilinta were absolutely needed. Upper endoscopy revealed no bleeding. Colonoscopy could not be done while the patient was on dual antiplatelets therapy. His anticoagulation has been held since that time. This continues to be a complicated question. We will be looking into whether or not proceeding with cardiac catheterization would help with information gathering to make further decisions.

## 2015-08-10 NOTE — Assessment & Plan Note (Signed)
The patient had severe GI bleed in 2009. He received multiple units of blood. It was felt that he had a diverticular bleed at that time. At that time Coumadin could be resumed. With his acute coronary syndrome in August, 2015 became clear that there was an absolute need for both aspirin and Brilinta. This has been continued without bleeding. However I have been unwilling to resume anticoagulation. He has not had a follow-up colonoscopy. We will look into the possibility of cardiac catheterization to reassess his coronary anatomy to help with further decision making concerning the drugs that are needed for his coronaries and his atrial fib history.

## 2015-08-10 NOTE — Telephone Encounter (Signed)
Called patient to update.  Sleep study has not been scanned into chart at this time.  Will contact patient once received.

## 2015-08-10 NOTE — Progress Notes (Signed)
Cardiology Office Note   Date:  08/10/2015   ID:  [name] 1932-01-02, MRN [id number]  PCP:  Jeremiah Stain, MD  Cardiologist:  Jeremiah Argyle, MD   Chief Complaint  Patient presents with  . Appointment    Follow-up CAD      History of Present Illness: EWARD [name] is a 81 y.o. male who presents today to follow up coronary disease. He is stable. He has chronic mild exertional angina. We have his medicines titrated the best for him at this time. He continues on dual antiplatelets therapy. His wife mentions today that at times he may have some chest tightness after sexual activity. We had a careful discussion about that.  The patient has continued to have exertional angina. His overall situation is quite complicated related to the need for medicines for his coronary disease. However in addition, there is a need to try to resume his anticoagulation for his history of atrial fibrillation. In addition, the situation is complicated by the fact that he had GI bleeding during the hospitalization in August, 2015, when he had interventions for his coronary disease.    Past Medical History  Diagnosis Date  . GERD (gastroesophageal reflux disease)   . Anemia     Secondary to acute blood loss  . Hypertension   . Hyperlipidemia   . Diabetes mellitus     Type II  . Diabetic peripheral neuropathy   . RBBB (right bundle branch block)   . Macular degeneration   . Sleep apnea   . Carotid artery disease     Doppler 09/18/2009 - 49% bilateral stenoses  . Personal history of colonic polyps   . Diverticulosis of colon with hemorrhage 2009    several unit diverticular bleed   . Erectile dysfunction     Mild  . PNA (pneumonia) 9/11    NSTEMI at [facility] with repeat cath, rec medical mgmt   . Atrial flutter 07/2010    September, 2011   Hospital with PNA and cath done.Marland KitchenMarland KitchenCoumadin.  Atrial flutter ablation planned, but  pt. then had atrial fibrillation,/outpatient conversion  09/08/10..NSR..plan to follow..Dr. Caryl [name]  . Chronic kidney disease     ckd #3  . Gout   . Skin cancer     R lower leg, per derm 2012  . Atrial fibrillation     Consideration was given for atrial flutter ablation, but patient developed atrial fibrillation. Cardioversion was done. Dr. Caryl [name] decided to watch him clinically. November, 2011  . Carotid artery disease     49% bilateral, Doppler, November, 2010  . CAD (coronary artery disease)     Catheterization, September, 2011,  grafts patent from redo CABG,, medical therapy of coronary disease, consideration to proceeding with atrial flutter ablation  . Ejection fraction     EF 60%, echo, 2009  //   EF 65%, echo, September, 2011  . Mitral regurgitation     Mild, echo, September, 2011  . Shoulder pain     Decreasing Crestor did not affect the pain    Past Surgical History  Procedure Laterality Date  . Cardiac catheterization  2006    Nuclear..slight lateral ischemia..medical therapy  . Cardiac catheterization  08/04/2010    grafts patent from redo CABG...medical Rx and ablate Atrial flutter (LV not injected)   . Doppler echocardiography  08/2008    EF 60%  . Doppler echocardiography  08/02/2010    65-70%  . Doppler echocardiography  07/2010    MR mild  .  Coronary artery bypass graft  1995     Redo  CABG 2006  . Polypectomy    . Cataract extraction    . Vasectomy    . Tonsillectomy    . Carotid endarterectomy    . Coronary artery bypass graft  1995, 2006  . Cholecystectomy  2008  . Esophagogastroduodenoscopy N/A 06/19/2014    Procedure: ESOPHAGOGASTRODUODENOSCOPY (EGD);  Surgeon: Jeremiah Bears, MD;  Location: [facility] ENDOSCOPY;  Service: Endoscopy;  Laterality: N/A;  . Left heart catheterization with coronary/graft angiogram N/A 06/11/2014    Procedure: LEFT HEART CATHETERIZATION WITH [name];  Surgeon: Jeremiah Grooms, MD;  Location: [facility] CATH LAB;  Service: Cardiovascular;  Laterality: N/A;  . Percutaneous coronary stent  intervention (pci-s) N/A 06/13/2014    Procedure: PERCUTANEOUS CORONARY STENT INTERVENTION (PCI-S);  Surgeon: Jeremiah Grooms, MD;  Location: [facility] CATH LAB;  Service: Cardiovascular;  Laterality: N/A;  . Left heart cath N/A 06/14/2014    Procedure: LEFT HEART CATH;  Surgeon: Jeremiah Grooms, MD;  Location: [facility] CATH LAB;  Service: Cardiovascular;  Laterality: N/A;    Patient Active Problem List   Diagnosis Date Noted  . Gout 09/09/2014  . Medicare annual wellness visit, initial 09/09/2014  . Advance care planning 09/09/2014  . Ejection fraction   . Heme positive stool 06/19/2014  . Long-term (current) use of anticoagulants   . RBBB (right bundle branch block)   . Carotid artery disease   . CAD (coronary artery disease)   . Hx of CABG   . History of gastrointestinal bleeding   . Mitral regurgitation   . ED (erectile dysfunction) 03/01/2012  . CKD (chronic kidney disease) stage 3, GFR 30-59 ml/min 03/24/2011  . Atrial flutter 08/24/2010  . Paroxysmal atrial fibrillation 08/09/2010  . Peripheral sensory neuropathy due to type 2 diabetes mellitus   . GERD   . Sleep apnea   . Type 2 diabetes mellitus with peripheral neuropathy   . Hyperlipidemia   . Hypertensive heart disease       Current Outpatient Prescriptions  Medication Sig Dispense Refill  . amLODipine (NORVASC) 10 MG tablet TAKE 1 TABLET EVERY EVENING 90 tablet 1  . aspirin EC 81 MG EC tablet Take 1 tablet (81 mg total) by mouth daily.    . Blood Glucose Monitoring Suppl (FREESTYLE LITE) DEVI Use to check blood sugar once daily and as needed.  Diagnosis:  E11.40  Insulin dependent. 1 each 0  . BRILINTA 90 MG TABS tablet TAKE 1 TABLET TWICE A DAY 180 tablet 0  . Cholecalciferol (VITAMIN D) 1000 UNITS capsule Take 2,000 Units by mouth daily.     . colchicine 0.6 MG tablet Take 0.6 mg by mouth 3 (three) times a week. For gout flare ups    . CRESTOR 20 MG tablet TAKE 1 TABLET DAILY 30 tablet 0  . fish oil-omega-3 fatty acids  1000 MG capsule Take 1 g by mouth daily.     . furosemide (LASIX) 80 MG tablet TAKE 1 TABLET DAILY 90 tablet 0  . glucose blood (FREESTYLE LITE) test strip Use as instructed to test blood sugar once daily and as needed.  Diagnosis:  E11.40  Insulin Dependent. 100 each 3  . insulin glargine (LANTUS) 100 UNIT/ML injection INJECT 25 UNITS UNDER THE SKIN DAILY AS INSTRUCTED BY DOCTOR    . Insulin Syringe-Needle U-100 (INSULIN SYRINGE .3CC/31GX5/16") 31G X 5/16" 0.3 ML MISC Use daily as instructed. 100 each 3  . isosorbide mononitrate (  IMDUR) 30 MG 24 hr tablet Take 2 tablets (60 mg total) by mouth 2 (two) times daily. 360 tablet 0  . magnesium oxide (MAG-OX) 400 MG tablet Take 400 mg by mouth daily.     . metoprolol succinate (TOPROL-XL) 50 MG 24 hr tablet TAKE 1 TABLET DAILY WITH A MEAL OR IMMEDIATELY FOLLOWING A MEAL 90 tablet 0  . Multiple Vitamin (MULTIVITAMIN WITH MINERALS) TABS Take 1 tablet by mouth daily.    . nitroGLYCERIN (NITROSTAT) 0.4 MG SL tablet Place 1 tablet (0.4 mg total) under the tongue every 5 (five) minutes as needed for chest pain. 90 tablet 0  . Potassium Gluconate (K-99) 595 MG CAPS Take 595 mg by mouth every evening.     . traMADol (ULTRAM) 50 MG tablet Take 1 tablet (50 mg total) by mouth every 6 (six) hours as needed. ARTHRITIS 360 tablet 1   No current facility-administered medications for this visit.    Allergies:   Review of patient's allergies indicates no known allergies.    Social History:  The patient  reports that he quit smoking about 56 years ago. He has never used smokeless tobacco. He reports that he does not drink alcohol or use illicit drugs.   Family History:  The patient's family history includes Arthritis in his sister; Cancer in his sister; Diabetes in his mother and sister; Heart disease in his father and sister; Kidney disease in his mother; Stroke in his mother. There is no history of Prostate cancer or Colon cancer.    ROS:  Please see the history  of present illness.     Patient denies fever, chills, headache, sweats, rash, change in vision, change in hearing, cough, nausea or vomiting, urinary symptoms. All other systems are reviewed and are negative.   PHYSICAL EXAM: VS:  BP 136/70 mmHg  Pulse 62  Ht 5\' 8"  (1.727 m)  Wt 193 lb 9.6 oz (87.816 kg)  BMI 29.44 kg/m2 , Patient is oriented to person time and place. Affect is normal. He is here with his wife. Head is atraumatic. Sclera and conjunctiva are normal. There is no jugulovenous distention. Lungs are clear. Respiratory effort is not labored. The patient is overweight. Cardiac exam reveals an S1 and S2. The abdomen is protuberant but soft. There is no peripheral edema. There are no musculoskeletal deformities. He does have some scattered ecchymoses on his arms.   EKG:   EKG is done today and reviewed by me. He has old right bundle branch block. There is normal sinus rhythm. There is no significant change.   Recent Labs: 09/11/2014: Hemoglobin 10.9*; Platelets 186.0 12/26/2014: BUN 31*; Creatinine, Ser 2.07*; Potassium 3.9; Sodium 139    Lipid Panel    Component Value Date/Time   CHOL 102 09/01/2014 0945   TRIG 86.0 09/01/2014 0945   HDL 28.90* 09/01/2014 0945   CHOLHDL 4 09/01/2014 0945   VLDL 17.2 09/01/2014 0945   LDLCALC 56 09/01/2014 0945      Wt Readings from Last 3 Encounters:  08/10/15 193 lb 9.6 oz (87.816 kg)  04/28/15 189 lb 12 oz (86.07 kg)  02/06/15 189 lb 3.2 oz (85.821 kg)      Current medicines are reviewed  The patient understands his medications.     ASSESSMENT AND PLAN:

## 2015-08-10 NOTE — Assessment & Plan Note (Addendum)
The patient's last catheterization was in August, 2015. Ejection fraction was 55%. Patient was carefully hydrated for 48 hours and loaded with Plavix at that time. The patient had a drug-eluting stent to the SVG to the marginal. The plan was for dual antiplatelets therapy for at least a year and likely indefinitely. However the patient had continued pain after the procedure. P2Y12 was 266, which is suboptimal. There was 50-60% stenosis proximal to the previously placed stent treated with DES to a 4 mm vessel. The graft was patent afterwards. Chest discomfort may have been due to suboptimal antiplatelets therapy and transient thrombosis with distal embolization from the previously placed stent. Plavix was discontinued. Brilinta was started and has been continued.  The patient has had continued stable exertional angina. He controls his activity level. We have adjusted the medicines that he can tolerate. His wife is concerned that he has had some angina requiring nitroglycerin after sexual activity. We discussed trying a nitroglycerin ahead of time. If he has any prolonged symptoms, he will have to seriously consider backing off from this activity. I will not be making any changes today. With the ongoing need for dual antiplatelets therapy, I have not been able to resume anticoagulation for his prior history of atrial fib and atrial flutter. In addition he has not had a colonoscopy after his GI bleeding. Historically, he had had diverticular disease. The assumption to this point has been that he may have had some diverticular bleeding. Over time, I have considered whether repeat catheterization would be appropriate since he was still having angina after his procedures in August, 2015. I have not pushed for cath, hoping that the overall situation would stabilize. However, with his exertional symptoms, continued dual antiplatelets therapy, need for anticoagulation, and need for colonoscopy at some point, I feel that  we should consider proceeding with cardiac catheterization. The patient has been treated by Dr. Gwenlyn Found before and wants to follow with him after I retire later this week. I will arrange for the patient to have an early outpatient visit with Dr. Gwenlyn Found. At that time, they can decide together if proceeding with cardiac catheterization is appropriate.

## 2015-08-10 NOTE — Patient Instructions (Signed)
Medication Instructions:  Same-no changes  Labwork: None  Testing/Procedures: None  Follow-Up: Your physician wants you to follow-up in: 6 month with Dr Gwenlyn Found at our [facility] office.  You will receive a reminder letter in the mail two months in advance. If you don't receive a letter, please call our office to schedule the follow-up appointment.

## 2015-08-10 NOTE — Assessment & Plan Note (Signed)
The patient's carotid disease is followed carefully. Doppler January, 2016 revealed stable bilateral 40-59% disease.

## 2015-08-17 ENCOUNTER — Encounter: Payer: Self-pay | Admitting: *Deleted

## 2015-08-17 NOTE — Telephone Encounter (Signed)
Sleep studies scanned under procedures February 18, 2001 and February 19, 2003).  Please print both.  Please send order, a copy of the sleep studies, demographics and insurance-  faxed to [phone]. order for full face mask (Large), Head straps, tubing and new CPAP machine since his is >79 years old.  Dx G47.30.   Thanks.

## 2015-08-17 NOTE — Telephone Encounter (Signed)
All paperwork attached with order for signature on your desk.

## 2015-08-18 NOTE — Telephone Encounter (Signed)
Form faxed

## 2015-08-18 NOTE — Telephone Encounter (Signed)
Signed. thanks

## 2015-08-24 ENCOUNTER — Other Ambulatory Visit: Payer: Self-pay | Admitting: Cardiology

## 2015-08-28 ENCOUNTER — Ambulatory Visit: Payer: TRICARE For Life (TFL) | Admitting: Family Medicine

## 2015-08-31 ENCOUNTER — Ambulatory Visit (INDEPENDENT_AMBULATORY_CARE_PROVIDER_SITE_OTHER): Payer: Medicare Other | Admitting: Family Medicine

## 2015-08-31 ENCOUNTER — Encounter: Payer: Self-pay | Admitting: Family Medicine

## 2015-08-31 VITALS — BP 122/60 | HR 62 | Temp 98.4°F | Wt 194.8 lb

## 2015-08-31 DIAGNOSIS — N183 Chronic kidney disease, stage 3 unspecified: Secondary | ICD-10-CM

## 2015-08-31 DIAGNOSIS — I6523 Occlusion and stenosis of bilateral carotid arteries: Secondary | ICD-10-CM | POA: Diagnosis not present

## 2015-08-31 DIAGNOSIS — E1142 Type 2 diabetes mellitus with diabetic polyneuropathy: Secondary | ICD-10-CM | POA: Diagnosis not present

## 2015-08-31 DIAGNOSIS — Z23 Encounter for immunization: Secondary | ICD-10-CM

## 2015-08-31 DIAGNOSIS — R251 Tremor, unspecified: Secondary | ICD-10-CM | POA: Diagnosis not present

## 2015-08-31 NOTE — Progress Notes (Signed)
Pre visit review using our clinic review tool, if applicable. No additional management support is needed unless otherwise documented below in the visit note.  He should be getting his CPAP supplies soon, d/w pt.   He has renal labs pending at renal clinic.    Diabetes:  Using medications without difficulties:yes Hypoglycemic episodes:no Hyperglycemic episodes:no Feet problems: no Blood Sugars averaging: ~150 A1c pending- written order given to patient to have labs done with renal labs.    occ B hand tremor noted, not a resting tremor.  FH of tremor noted, sister, but no FH parkinsons.  No gait changes.  No rigidity.    PMH and SH reviewed  Meds, vitals, and allergies reviewed.   ROS: See HPI.  Otherwise negative.    GEN: nad, alert and oriented HEENT: mucous membranes moist NECK: supple w/o LA CV: rrr. PULM: ctab, no inc wob ABD: soft, +bs EXT: no edema SKIN: no acute rash B hand tremor noted with extension, not at rest.  Equally B, mild.  No other tremor noted.

## 2015-08-31 NOTE — Patient Instructions (Signed)
Take care.  Glad to see you.   Recheck at a physical in about 4 months.  Ask the kidney clinic to get an A1c done.

## 2015-09-01 DIAGNOSIS — R251 Tremor, unspecified: Secondary | ICD-10-CM | POA: Insufficient documentation

## 2015-09-01 NOTE — Assessment & Plan Note (Signed)
No change in meds at this point.  Will ask for renal clinic to check A1c with other lab. Written order given to patient. He agrees.  We'll address when I get his A1c back.

## 2015-09-01 NOTE — Assessment & Plan Note (Signed)
F/u with renal pending.

## 2015-09-01 NOTE — Assessment & Plan Note (Signed)
Likely benign tremor, he'll notify me if worse, no sign of ominous sx.  Okay for outpatient f/u.  Not bothersome to patient currently.

## 2015-09-02 ENCOUNTER — Ambulatory Visit (INDEPENDENT_AMBULATORY_CARE_PROVIDER_SITE_OTHER): Payer: Medicare Other | Admitting: Cardiovascular Disease

## 2015-09-02 ENCOUNTER — Encounter: Payer: Self-pay | Admitting: Cardiovascular Disease

## 2015-09-02 VITALS — BP 144/58 | HR 66 | Ht 69.0 in | Wt 197.0 lb

## 2015-09-02 DIAGNOSIS — Z951 Presence of aortocoronary bypass graft: Secondary | ICD-10-CM

## 2015-09-02 DIAGNOSIS — I779 Disorder of arteries and arterioles, unspecified: Secondary | ICD-10-CM | POA: Diagnosis not present

## 2015-09-02 DIAGNOSIS — I6523 Occlusion and stenosis of bilateral carotid arteries: Secondary | ICD-10-CM | POA: Diagnosis not present

## 2015-09-02 DIAGNOSIS — I739 Peripheral vascular disease, unspecified: Principal | ICD-10-CM

## 2015-09-02 MED ORDER — RANOLAZINE ER 500 MG PO TB12: 500.0000 mg | ORAL_TABLET | Freq: Two times a day (BID) | ORAL | Status: DC

## 2015-09-02 NOTE — Assessment & Plan Note (Signed)
history of paroxysmal atrial fibrillation on Coumadin anticoagulation in the past which was discontinued because of GI bleed and the need to be on dual antiplatelet therapy. He has maintained sinus rhythm.

## 2015-09-02 NOTE — Progress Notes (Signed)
09/02/2015 [name] Thome   05/30/1932  [id number]  Primary Physician [name], MD Primary Cardiologist: [name] MD Renae Gloss   HPI:  [name] is a very pleasant 81 year old engaged Caucasian male accompanied by his fiance [name], formerly a patient of [name]. I'm assuming his care after the recent retirement of Dr. [name]. [name] is the father of one daughter and one son in the grandfather to grandchildren. 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 His past medical history is remarkable for coronary artery bypass grafting in 1996 and again in 2006 by [name]. He had remote right carotid endarterectomy by [name] in 1994. History of hypertension, hyperlipidemia and diabetes. He was hospitalized in late July 2015 because of unstable angina. [name] performed cardiac catheterization revealing a high-grade obtuse marginal branch vein graft stenosis which was stented. Because of recurrent chest pain he was re-intervention relates later and found to have a new hazy lesion just proximal to the previously placed stent which was again intervened on. He was deemed a Plavix nonresponder and was begun on Brilenta. He also has a remote history of PAF as well as diverticulitis and has had GI bleed on Coumadin. His oral anticoagulation was discontinued. He does get effort angina.    Current Outpatient Prescriptions  Medication Sig Dispense Refill  . amLODipine (NORVASC) 10 MG tablet TAKE 1 TABLET EVERY EVENING 90 tablet 1  . aspirin EC 81 MG EC tablet Take 1 tablet (81 mg total) by mouth daily.    . Blood Glucose Monitoring Suppl (FREESTYLE LITE) DEVI Use to check blood sugar once daily and as needed.  Diagnosis:  E11.40  Insulin dependent. 1 each 0  . BRILINTA 90 MG TABS tablet TAKE 1 TABLET TWICE A DAY 180 tablet 0  . Cholecalciferol (VITAMIN D) 1000 UNITS capsule Take 1,000 Units by mouth daily.     .  colchicine 0.6 MG tablet Take 0.6 mg by mouth 3 (three) times a week. For gout flare ups    . CRESTOR 20 MG tablet TAKE 1 TABLET DAILY 30 tablet 0  . fish oil-omega-3 fatty acids 1000 MG capsule Take 1 g by mouth daily.     . furosemide (LASIX) 80 MG tablet TAKE 1 TABLET DAILY 90 tablet 0  . glucose blood (FREESTYLE LITE) test strip Use as instructed to test blood sugar once daily and as needed.  Diagnosis:  E11.40  Insulin Dependent. 100 each 3  . insulin glargine (LANTUS) 100 UNIT/ML injection INJECT 25 UNITS UNDER THE SKIN DAILY AS INSTRUCTED BY DOCTOR    . Insulin Syringe-Needle U-100 (INSULIN SYRINGE .3CC/31GX5/16") 31G X 5/16" 0.3 ML MISC Use daily as instructed. 100 each 3  . isosorbide mononitrate (IMDUR) 30 MG 24 hr tablet TAKE 2 TABLETS TWICE A DAY 360 tablet 1  . magnesium oxide (MAG-OX) 400 MG tablet Take 400 mg by mouth daily.     . metoprolol succinate (TOPROL-XL) 50 MG 24 hr tablet TAKE 1 TABLET DAILY WITH A MEAL OR IMMEDIATELY FOLLOWING A MEAL 90 tablet 0  . Multiple Vitamin (MULTIVITAMIN WITH MINERALS) TABS Take 1 tablet by mouth daily.    . nitroGLYCERIN (NITROSTAT) 0.4 MG SL tablet Place 1 tablet (0.4 mg total) under the tongue every 5 (five) minutes as needed for chest pain. 90 tablet 0  . Potassium Gluconate (K-99) 595 MG CAPS Take 595 mg by mouth every evening.     . traMADol (  ULTRAM) 50 MG tablet Take 1 tablet (50 mg total) by mouth every 6 (six) hours as needed. ARTHRITIS 360 tablet 1  . ranolazine (RANEXA) 500 MG 12 hr tablet Take 1 tablet (500 mg total) by mouth 2 (two) times daily. 90 tablet 3   No current facility-administered medications for this visit.    No Known Allergies  Social History   Social History  . Marital Status: Widowed    Spouse Name: N/A  . Number of Children: 2  . Years of Education: N/A   Occupational History  . 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 Smoking status: Former Smoker    Quit date: 11/14/1958  .  Smokeless tobacco: Never Used  . Alcohol Use: No  . Drug Use: No  . Sexual Activity: Not on file   Other Topics Concern  . Not on file   Social History Narrative   From [place].  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  Widowed 12/2005.     Review of Systems: General: negative for chills, fever, night sweats or weight changes.  Cardiovascular: negative for chest pain, dyspnea on exertion, edema, orthopnea, palpitations, paroxysmal nocturnal dyspnea or shortness of breath Dermatological: negative for rash Respiratory: negative for cough or wheezing Urologic: negative for hematuria Abdominal: negative for nausea, vomiting, diarrhea, bright red blood per rectum, melena, or hematemesis Neurologic: negative for visual changes, syncope, or dizziness All other systems reviewed and are otherwise negative except as noted above.    Blood pressure 144/58, pulse 66, height 5\' 9"  (1.753 m), weight 197 lb (89.359 kg).  General appearance: alert and no distress Neck: no adenopathy, no JVD, supple, symmetrical, trachea midline, thyroid not enlarged, symmetric, no tenderness/mass/nodules and soft bilateral carotid bruits Lungs: clear to auscultation bilaterally Heart: regular rate and rhythm, S1, S2 normal, no murmur, click, rub or gallop Extremities: extremities normal, atraumatic, no cyanosis or edema  EKG not performed today  ASSESSMENT AND PLAN:   Paroxysmal atrial fibrillation history of paroxysmal atrial fibrillation on Coumadin anticoagulation in the past which was discontinued because of GI bleed and the need to be on dual antiplatelet therapy. He has maintained sinus rhythm.  Hyperlipidemia History of hyperlipidemia on Crestor 20 mg a day followed by his PCP  Hx of CABG History of coronary artery disease status post bypass grafting in 1995 with 3 grafts placed at that time and redo bypass in 2006 with again 3 grafts  placed by [name]. He was catheterized by [name] in July 2015 and had a stent placed to the circumflex vein graft. Because of recurrent chest pain he was recatheterized several days later was found to have a moderate hazy blockage just proximal to the stent which was restented. He was felt to be a Plavix nonresponder and was begun on Brilenta. Because of effort angina his Imdur was recently doubled although he still gets chest pain on exertion. I'm going to add next 500 mg by mouth twice a day I will see him back in 3 months for follow-up.  Carotid artery disease History of carotid artery disease status post remote right carotid endarterectomy 1994 by [name]. We have been following his carotid duplex study which was most recently done 11/27/14 revealing moderate bilateral ICA stenosis. He was neurologically asymptomatic.      [name] MD FACP,FACC,FAHA, [facility] 09/02/2015 3:57 PM

## 2015-09-02 NOTE — Patient Instructions (Signed)
Medication Instructions:  Your physician has recommended you make the following change in your medication:  1) START Ranexa 500 mg by mouth TWICE Daily   Labwork: none  Testing/Procedures: Your physician has requested that you have a carotid duplex. This test is an ultrasound of the carotid arteries in your neck. It looks at blood flow through these arteries that supply the brain with blood. Allow one hour for this exam. There are no restrictions or special instructions.    Follow-Up: Your physician recommends that you schedule a follow-up appointment in: 3 months with Dr. Gwenlyn Found    Any Other Special Instructions Will Be Listed Below (If Applicable).

## 2015-09-02 NOTE — Assessment & Plan Note (Signed)
History of hyperlipidemia on Crestor 20 mg a day followed by his PCP 

## 2015-09-02 NOTE — Assessment & Plan Note (Signed)
History of coronary artery disease status post bypass grafting in 1995 with 3 grafts placed at that time and redo bypass in 2006 with again 3 grafts placed by [name]. He was catheterized by [name] in July 2015 and had a stent placed to the circumflex vein graft. Because of recurrent chest pain he was recatheterized several days later was found to have a moderate hazy blockage just proximal to the stent which was restented. He was felt to be a Plavix nonresponder and was begun on Brilenta. Because of effort angina his Imdur was recently doubled although he still gets chest pain on exertion. I'm going to add next 500 mg by mouth twice a day I will see him back in 3 months for follow-up.

## 2015-09-02 NOTE — Assessment & Plan Note (Signed)
History of carotid artery disease status post remote right carotid endarterectomy 1994 by [name]. We have been following his carotid duplex study which was most recently done 11/27/14 revealing moderate bilateral ICA stenosis. He was neurologically asymptomatic.

## 2015-09-04 ENCOUNTER — Other Ambulatory Visit: Payer: Self-pay | Admitting: *Deleted

## 2015-09-04 MED ORDER — NITROGLYCERIN 0.4 MG SL SUBL: 0.4000 mg | SUBLINGUAL_TABLET | SUBLINGUAL | Status: DC | PRN

## 2015-09-09 ENCOUNTER — Inpatient Hospital Stay (HOSPITAL_COMMUNITY): Admission: RE | Admit: 2015-09-09 | Payer: TRICARE For Life (TFL) | Source: Ambulatory Visit

## 2015-09-19 ENCOUNTER — Other Ambulatory Visit: Payer: Self-pay | Admitting: Cardiology

## 2015-09-24 DIAGNOSIS — E889 Metabolic disorder, unspecified: Secondary | ICD-10-CM | POA: Diagnosis not present

## 2015-09-24 DIAGNOSIS — N183 Chronic kidney disease, stage 3 (moderate): Secondary | ICD-10-CM | POA: Diagnosis not present

## 2015-09-24 DIAGNOSIS — E1149 Type 2 diabetes mellitus with other diabetic neurological complication: Secondary | ICD-10-CM | POA: Diagnosis not present

## 2015-09-24 DIAGNOSIS — N189 Chronic kidney disease, unspecified: Secondary | ICD-10-CM | POA: Diagnosis not present

## 2015-09-28 ENCOUNTER — Other Ambulatory Visit: Payer: Self-pay

## 2015-09-28 ENCOUNTER — Encounter: Payer: Self-pay | Admitting: Family Medicine

## 2015-09-28 DIAGNOSIS — I779 Disorder of arteries and arterioles, unspecified: Secondary | ICD-10-CM

## 2015-09-28 DIAGNOSIS — E889 Metabolic disorder, unspecified: Secondary | ICD-10-CM | POA: Diagnosis not present

## 2015-09-28 DIAGNOSIS — I10 Essential (primary) hypertension: Secondary | ICD-10-CM | POA: Diagnosis not present

## 2015-09-28 DIAGNOSIS — M9080 Osteopathy in diseases classified elsewhere, unspecified site: Secondary | ICD-10-CM | POA: Diagnosis not present

## 2015-09-28 DIAGNOSIS — I739 Peripheral vascular disease, unspecified: Principal | ICD-10-CM

## 2015-09-28 DIAGNOSIS — N183 Chronic kidney disease, stage 3 (moderate): Secondary | ICD-10-CM | POA: Diagnosis not present

## 2015-09-28 DIAGNOSIS — D631 Anemia in chronic kidney disease: Secondary | ICD-10-CM | POA: Diagnosis not present

## 2015-09-28 LAB — HEMOGLOBIN A1C: Hemoglobin A1C: 6.9

## 2015-09-28 MED ORDER — RANOLAZINE ER 500 MG PO TB12: 500.0000 mg | ORAL_TABLET | Freq: Two times a day (BID) | ORAL | Status: DC

## 2015-09-28 MED ORDER — ROSUVASTATIN CALCIUM 20 MG PO TABS: 20.0000 mg | ORAL_TABLET | Freq: Every day | ORAL | Status: DC

## 2015-10-01 ENCOUNTER — Other Ambulatory Visit: Payer: Self-pay | Admitting: Cardiology

## 2015-10-05 ENCOUNTER — Other Ambulatory Visit: Payer: Self-pay | Admitting: Cardiology

## 2015-10-15 HISTORY — PX: SKIN CANCER EXCISION: SHX779

## 2015-11-03 ENCOUNTER — Ambulatory Visit (INDEPENDENT_AMBULATORY_CARE_PROVIDER_SITE_OTHER): Payer: Medicare Other | Admitting: Internal Medicine

## 2015-11-03 ENCOUNTER — Encounter: Payer: Self-pay | Admitting: Internal Medicine

## 2015-11-03 VITALS — BP 126/72 | HR 66 | Temp 97.7°F | Wt 191.0 lb

## 2015-11-03 DIAGNOSIS — I6523 Occlusion and stenosis of bilateral carotid arteries: Secondary | ICD-10-CM | POA: Diagnosis not present

## 2015-11-03 DIAGNOSIS — L989 Disorder of the skin and subcutaneous tissue, unspecified: Secondary | ICD-10-CM

## 2015-11-03 MED ORDER — CEPHALEXIN 500 MG PO CAPS: 500.0000 mg | ORAL_CAPSULE | Freq: Three times a day (TID) | ORAL | Status: DC

## 2015-11-03 NOTE — Patient Instructions (Signed)
Excision of Skin Lesions °Excision of a skin lesion refers to the removal of a section of skin by making small cuts (incisions) in the skin. This procedure may be done to remove a cancerous (malignant) or noncancerous (benign) growth on the skin. It is typically done to treat or prevent cancer or infection. It may also be done to improve cosmetic appearance. °The procedure may be done to remove: °· Cancerous growths, such as basal cell carcinoma, squamous cell carcinoma, or melanoma. °· Noncancerous growths, such as a cyst or lipoma. °· Growths, such as moles or skin tags, which may be removed for cosmetic reasons. °Various excision or surgical techniques may be used depending on your condition, the location of the lesion, and your overall health. °LET YOUR HEALTH CARE PROVIDER KNOW ABOUT: °· Any allergies you have. °· All medicines you are taking, including vitamins, herbs, eye drops, creams, and over-the-counter medicines. °· Previous problems you or members of your family have had with the use of anesthetics. °· Any blood disorders you have. °· Previous surgeries you have had. °· Any medical conditions you have. °· Whether you are pregnant or may be pregnant. °RISKS AND COMPLICATIONS °Generally, this is a safe procedure. However, problems may occur, including: °· Bleeding. °· Infection. °· Scarring. °· Recurrence of the cyst, lipoma, or cancer. °· Changes in skin sensation or appearance, such as discoloration or swelling. °· Reaction to the anesthetics. °· Allergic reaction to surgical materials or ointments. °· Damage to nerves, blood vessels, muscles, or other structures. °· Continued pain. °BEFORE THE PROCEDURE °· Ask your health care provider about: °¨ Changing or stopping your regular medicines. This is especially important if you are taking diabetes medicines or blood thinners. °¨ Taking medicines such as aspirin and ibuprofen. These medicines can thin your blood. Do not take these medicines before your  procedure if your health care provider instructs you not to. °· You may be asked to take certain medicines. °· You may be asked to stop smoking. °· You may have an exam or testing. °· Plan to have someone take you home after the procedure. °· Plan to have someone help you with activities during recovery. °PROCEDURE °· To reduce your risk of infection: °¨ Your health care team will wash or sanitize their hands. °¨ Your skin will be washed with soap. °· You will be given a medicine to numb the area (local anesthetic). °· One of the following excision techniques will be performed. °· At the end of any of these procedures, antibiotic ointment will be applied as needed. °Each of the following techniques may vary among health care providers and hospitals. °Complete Surgical Excision °The area of skin that needs to be removed will be marked with a pen. Using a small scalpel or scissors, the surgeon will gently cut around and under the lesion until it is completely removed. The lesion will be placed in a fluid and sent to the lab for examination. If necessary, bleeding will be controlled with a device that delivers heat (electrocautery). The edges of the wound may be stitched (sutured) together, and a bandage (dressing) will be applied. This procedure may be performed to treat a cancerous growth or a noncancerous cyst or lesion. °Excision of a Cyst °The surgeon will make an incision on the cyst. The entire cyst will be removed through the incision. The incision may be closed with sutures. °Shave Excision °During shave excision, the surgeon will use a small blade or an electrically heated loop instrument to   shave off the lesion. This may be done to remove a mole or a skin tag. The wound will usually be left to heal on its own without sutures. °Punch Excision °During punch excision, the surgeon will use a small tool that is like a cookie cutter or a hole punch to cut a circle shape out of the skin. The outer edges of the skin  will be sutured together. This may be done to remove a mole or a scar or to perform a biopsy of the lesion. °Mohs Micrographic Surgery °During Mohs micrographic surgery, layers of the lesion will be removed with a scalpel or a loop instrument and will be examined right away under a microscope. Layers will be removed until all of the abnormal or cancerous tissue has been removed. This procedure is minimally invasive, and it ensures the best cosmetic outcome. It involves the removal of as little normal tissue as possible. Mohs is usually done to treat skin cancer, such as basal cell carcinoma or squamous cell carcinoma, particularly on the face and ears. Depending on the size of the surgical wound, it may be sutured closed. °AFTER THE PROCEDURE °· Return to your normal activities as told by your health care provider. °· Talk with your health care provider to discuss any test results, treatment options, and if necessary, the need for more tests. °  °This information is not intended to replace advice given to you by your health care provider. Make sure you discuss any questions you have with your health care provider. °  °Document Released: 01/25/2010 Document Revised: 07/22/2015 Document Reviewed: 12/17/2014 °Elsevier Interactive Patient Education ©2016 [employer] ° °

## 2015-11-03 NOTE — Progress Notes (Signed)
Pre visit review using our clinic review tool, if applicable. No additional management support is needed unless otherwise documented below in the visit note. 

## 2015-11-03 NOTE — Progress Notes (Signed)
Subjective:    Patient ID: [name], male    DOB: 1931-12-11, 81 y.o.   MRN: [id number]  HPI  Pt presents to the clinic today with c/o a lesion to back of his left leg. He noticed this 10 days ago. Just before he noticed this, he reports his dog scratched him in the same area. The are is tender. He does feel like it has been slightly warm to touch. He has no noticed any drainage from the area. He has not tried anything OTC.  Review of Systems      Past Medical History  Diagnosis Date  . GERD (gastroesophageal reflux disease)   . Anemia     Secondary to acute blood loss  . Hypertension   . Hyperlipidemia   . Diabetes mellitus     Type II  . Diabetic peripheral neuropathy ([place])   . RBBB (right bundle branch block)   . Macular degeneration   . Sleep apnea   . Carotid artery disease ([place])     Doppler 09/18/2009 - 49% bilateral stenoses  . Personal history of colonic polyps   . Diverticulosis of colon with hemorrhage 2009    several unit diverticular bleed   . Erectile dysfunction     Mild  . PNA (pneumonia) 9/11    NSTEMI at [facility] with repeat cath, rec medical mgmt   . Atrial flutter [facility]) 07/2010    September, 2011   Hospital with PNA and cath done.Marland KitchenMarland KitchenCoumadin.  Atrial flutter ablation planned, but  pt. then had atrial fibrillation,/outpatient conversion 09/08/10..NSR..plan to follow..Dr. Caryl Comes  . Chronic kidney disease     ckd #3  . Gout   . Skin cancer     R lower leg, per derm 2012  . Atrial fibrillation ([place])     Consideration was given for atrial flutter ablation, but patient developed atrial fibrillation. Cardioversion was done. Dr. Caryl Comes decided to watch him clinically. November, 2011  . Carotid artery disease ([name])     49% bilateral, Doppler, November, 2010  . CAD (coronary artery disease)     Catheterization, September, 2011,  grafts patent from redo CABG,, medical therapy of coronary disease, consideration to proceeding with atrial flutter ablation  . Ejection  fraction     EF 60%, echo, 2009  //   EF 65%, echo, September, 2011  . Mitral regurgitation     Mild, echo, September, 2011  . Shoulder pain     Decreasing Crestor did not affect the pain    Current Outpatient Prescriptions  Medication Sig Dispense Refill  . amLODipine (NORVASC) 10 MG tablet Take 1 tablet (10 mg total) by mouth every evening. 90 tablet 3  . aspirin EC 81 MG EC tablet Take 1 tablet (81 mg total) by mouth daily.    . Blood Glucose Monitoring Suppl (FREESTYLE LITE) DEVI Use to check blood sugar once daily and as needed.  Diagnosis:  E11.40  Insulin dependent. 1 each 0  . BRILINTA 90 MG TABS tablet TAKE 1 TABLET TWICE A DAY (PLEASE CALL AND SCHEDULE AN APPOINTMENT) 180 tablet 0  . Cholecalciferol (VITAMIN D) 1000 UNITS capsule Take 1,000 Units by mouth daily.     . colchicine 0.6 MG tablet Take 0.6 mg by mouth 3 (three) times a week. For gout flare ups    . fish oil-omega-3 fatty acids 1000 MG capsule Take 1 g by mouth daily.     . furosemide (LASIX) 80 MG tablet TAKE 1 TABLET DAILY 90 tablet  3  . glucose blood (FREESTYLE LITE) test strip Use as instructed to test blood sugar once daily and as needed.  Diagnosis:  E11.40  Insulin Dependent. 100 each 3  . insulin glargine (LANTUS) 100 UNIT/ML injection INJECT 25 UNITS UNDER THE SKIN DAILY AS INSTRUCTED BY DOCTOR    . Insulin Syringe-Needle U-100 (INSULIN SYRINGE .3CC/31GX5/16") 31G X 5/16" 0.3 ML MISC Use daily as instructed. 100 each 3  . isosorbide mononitrate (IMDUR) 30 MG 24 hr tablet TAKE 2 TABLETS TWICE A DAY 360 tablet 1  . magnesium oxide (MAG-OX) 400 MG tablet Take 400 mg by mouth daily.     . Multiple Vitamin (MULTIVITAMIN WITH MINERALS) TABS Take 1 tablet by mouth daily.    . nitroGLYCERIN (NITROSTAT) 0.4 MG SL tablet Place 1 tablet (0.4 mg total) under the tongue every 5 (five) minutes as needed for chest pain. 100 tablet 1  . Potassium Gluconate (K-99) 595 MG CAPS Take 595 mg by mouth every evening.     . ranolazine  (RANEXA) 500 MG 12 hr tablet Take 1 tablet (500 mg total) by mouth 2 (two) times daily. 90 tablet 3  . rosuvastatin (CRESTOR) 20 MG tablet Take 1 tablet (20 mg total) by mouth daily. 90 tablet 1  . TOPROL XL 50 MG 24 hr tablet TAKE 1 TABLET DAILY WITH A MEAL OR IMMEDIATELY FOLLOWING A MEAL 90 tablet 3  . traMADol (ULTRAM) 50 MG tablet Take 1 tablet (50 mg total) by mouth every 6 (six) hours as needed. ARTHRITIS 360 tablet 1   No current facility-administered medications for this visit.    No Known Allergies  Family History  Problem Relation Age of Onset  . Kidney disease Mother     Kidney failure  . Stroke Mother   . Diabetes Mother   . Heart disease Father     MI  . Arthritis Sister   . Cancer Sister     Throat  . Heart disease Sister     MI  . Diabetes Sister   . Prostate cancer Neg Hx   . Colon cancer Neg Hx     Social History   Social History  . Marital Status: Widowed    Spouse Name: N/A  . Number of Children: 2  . Years of Education: N/A   Occupational History  . 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 Smoking status: Former Smoker    Quit date: 11/14/1958  . Smokeless tobacco: Never Used  . Alcohol Use: No  . Drug Use: No  . Sexual Activity: Not on file   Other Topics Concern  . Not on file   Social History Narrative   From [place].  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  Widowed 12/2005.     Constitutional: Denies fever, malaise, fatigue, headache or abrupt weight changes.  Respiratory: Denies difficulty breathing, shortness of breath, cough or sputum production.   Cardiovascular: Denies chest pain, chest tightness, palpitations or swelling in the hands or feet.  Skin: Pt reports skin lesion on posterior left leg. Denies redness, rashes, or ulcercations.    No other specific complaints in a complete review of systems (except as listed in HPI  above).  Objective:   Physical Exam   BP 126/72 mmHg  Pulse 66  Temp(Src) 97.7 F (36.5 C) (Oral)  Wt 191 lb (86.637 kg)  SpO2 98% Wt Readings from  Last 3 Encounters:  11/03/15 191 lb (86.637 kg)  09/02/15 197 lb (89.359 kg)  08/31/15 194 lb 12 oz (88.338 kg)    General: Appears his stated age, well developed, well nourished in NAD. Skin: Warm, dry and intact. 1 cm round, raised scaly lesion noted on posterior left leg. No redness, warm or drainage noted.   BMET    Component Value Date/Time   NA 139 12/26/2014 1127   K 3.9 12/26/2014 1127   CL 100 12/26/2014 1127   CO2 33* 12/26/2014 1127   GLUCOSE 142* 12/26/2014 1127   BUN 31* 12/26/2014 1127   BUN 27* 04/11/2013   CREATININE 2.07* 12/26/2014 1127   CALCIUM 10.4 12/26/2014 1127   GFRNONAA 37* 06/19/2014 0606   GFRAA 43* 06/19/2014 0606    Lipid Panel     Component Value Date/Time   CHOL 102 09/01/2014 0945   TRIG 86.0 09/01/2014 0945   HDL 28.90* 09/01/2014 0945   CHOLHDL 4 09/01/2014 0945   VLDL 17.2 09/01/2014 0945   LDLCALC 56 09/01/2014 0945    CBC    Component Value Date/Time   WBC 7.2 09/11/2014 1624   RBC 3.87* 09/11/2014 1624   HGB 10.9* 09/11/2014 1624   HCT 33.7* 09/11/2014 1624   PLT 186.0 09/11/2014 1624   MCV 87.1 09/11/2014 1624   MCH 29.4 06/19/2014 1000   MCHC 32.4 09/11/2014 1624   RDW 16.4* 09/11/2014 1624   LYMPHSABS 1.7 09/11/2014 1624   MONOABS 0.5 09/11/2014 1624   EOSABS 0.3 09/11/2014 1624   BASOSABS 0.0 09/11/2014 1624    Hgb A1C Lab Results  Component Value Date   HGBA1C 6.9 09/24/2015        Assessment & Plan:   Skin lesion of left leg:  eRx for Keflex 500 mg TID x 10 days in case it is infectious More likely a skin cancer, he will call his dermatologist to set up an appt for further evaluation  RTC as needed of if symptoms persist or worsen

## 2015-11-05 DIAGNOSIS — L570 Actinic keratosis: Secondary | ICD-10-CM | POA: Diagnosis not present

## 2015-11-05 DIAGNOSIS — C44729 Squamous cell carcinoma of skin of left lower limb, including hip: Secondary | ICD-10-CM | POA: Diagnosis not present

## 2015-11-05 DIAGNOSIS — X32XXXD Exposure to sunlight, subsequent encounter: Secondary | ICD-10-CM | POA: Diagnosis not present

## 2015-11-10 ENCOUNTER — Telehealth: Payer: Self-pay | Admitting: Cardiovascular Disease

## 2015-11-10 NOTE — Telephone Encounter (Signed)
Received records from [facility] for appointment on 12/02/15 with Dr Gwenlyn Found.  Records given to [facility] (medical records) for Dr Kennon Holter schedule on 12/02/15. lp

## 2015-11-12 ENCOUNTER — Encounter: Payer: Self-pay | Admitting: *Deleted

## 2015-11-13 ENCOUNTER — Inpatient Hospital Stay (HOSPITAL_COMMUNITY)
Admission: EM | Admit: 2015-11-13 | Discharge: 2015-11-21 | DRG: 377 | Disposition: A | Discharge status: Home / Self Care | Payer: Medicare Other | Attending: Internal Medicine | Admitting: Internal Medicine

## 2015-11-13 ENCOUNTER — Encounter (HOSPITAL_COMMUNITY): Payer: Self-pay | Admitting: Emergency Medicine

## 2015-11-13 ENCOUNTER — Telehealth: Payer: Self-pay | Admitting: Family Medicine

## 2015-11-13 DIAGNOSIS — Z87891 Personal history of nicotine dependence: Secondary | ICD-10-CM

## 2015-11-13 DIAGNOSIS — K5731 Diverticulosis of large intestine without perforation or abscess with bleeding: Secondary | ICD-10-CM | POA: Insufficient documentation

## 2015-11-13 DIAGNOSIS — K922 Gastrointestinal hemorrhage, unspecified: Secondary | ICD-10-CM | POA: Diagnosis not present

## 2015-11-13 DIAGNOSIS — E876 Hypokalemia: Secondary | ICD-10-CM | POA: Diagnosis not present

## 2015-11-13 DIAGNOSIS — Z955 Presence of coronary angioplasty implant and graft: Secondary | ICD-10-CM | POA: Diagnosis not present

## 2015-11-13 DIAGNOSIS — Z7982 Long term (current) use of aspirin: Secondary | ICD-10-CM | POA: Diagnosis not present

## 2015-11-13 DIAGNOSIS — E785 Hyperlipidemia, unspecified: Secondary | ICD-10-CM | POA: Diagnosis present

## 2015-11-13 DIAGNOSIS — Z85828 Personal history of other malignant neoplasm of skin: Secondary | ICD-10-CM

## 2015-11-13 DIAGNOSIS — I25119 Atherosclerotic heart disease of native coronary artery with unspecified angina pectoris: Secondary | ICD-10-CM | POA: Diagnosis present

## 2015-11-13 DIAGNOSIS — J189 Pneumonia, unspecified organism: Secondary | ICD-10-CM | POA: Diagnosis not present

## 2015-11-13 DIAGNOSIS — K625 Hemorrhage of anus and rectum: Secondary | ICD-10-CM | POA: Diagnosis not present

## 2015-11-13 DIAGNOSIS — Z8719 Personal history of other diseases of the digestive system: Secondary | ICD-10-CM

## 2015-11-13 DIAGNOSIS — N179 Acute kidney failure, unspecified: Secondary | ICD-10-CM | POA: Diagnosis present

## 2015-11-13 DIAGNOSIS — D649 Anemia, unspecified: Secondary | ICD-10-CM | POA: Insufficient documentation

## 2015-11-13 DIAGNOSIS — R918 Other nonspecific abnormal finding of lung field: Secondary | ICD-10-CM | POA: Diagnosis not present

## 2015-11-13 DIAGNOSIS — E1122 Type 2 diabetes mellitus with diabetic chronic kidney disease: Secondary | ICD-10-CM | POA: Diagnosis present

## 2015-11-13 DIAGNOSIS — N184 Chronic kidney disease, stage 4 (severe): Secondary | ICD-10-CM | POA: Diagnosis present

## 2015-11-13 DIAGNOSIS — R509 Fever, unspecified: Secondary | ICD-10-CM | POA: Diagnosis not present

## 2015-11-13 DIAGNOSIS — K5793 Diverticulitis of intestine, part unspecified, without perforation or abscess with bleeding: Secondary | ICD-10-CM | POA: Diagnosis not present

## 2015-11-13 DIAGNOSIS — I25799 Atherosclerosis of other coronary artery bypass graft(s) with unspecified angina pectoris: Secondary | ICD-10-CM | POA: Diagnosis not present

## 2015-11-13 DIAGNOSIS — K648 Other hemorrhoids: Secondary | ICD-10-CM | POA: Diagnosis present

## 2015-11-13 DIAGNOSIS — I214 Non-ST elevation (NSTEMI) myocardial infarction: Secondary | ICD-10-CM | POA: Diagnosis not present

## 2015-11-13 DIAGNOSIS — I130 Hypertensive heart and chronic kidney disease with heart failure and stage 1 through stage 4 chronic kidney disease, or unspecified chronic kidney disease: Secondary | ICD-10-CM | POA: Diagnosis present

## 2015-11-13 DIAGNOSIS — D696 Thrombocytopenia, unspecified: Secondary | ICD-10-CM | POA: Diagnosis not present

## 2015-11-13 DIAGNOSIS — Y95 Nosocomial condition: Secondary | ICD-10-CM | POA: Diagnosis not present

## 2015-11-13 DIAGNOSIS — K644 Residual hemorrhoidal skin tags: Secondary | ICD-10-CM | POA: Diagnosis present

## 2015-11-13 DIAGNOSIS — N189 Chronic kidney disease, unspecified: Secondary | ICD-10-CM | POA: Diagnosis not present

## 2015-11-13 DIAGNOSIS — I480 Paroxysmal atrial fibrillation: Secondary | ICD-10-CM | POA: Diagnosis present

## 2015-11-13 DIAGNOSIS — Z951 Presence of aortocoronary bypass graft: Secondary | ICD-10-CM

## 2015-11-13 DIAGNOSIS — K2970 Gastritis, unspecified, without bleeding: Secondary | ICD-10-CM | POA: Diagnosis present

## 2015-11-13 DIAGNOSIS — E1142 Type 2 diabetes mellitus with diabetic polyneuropathy: Secondary | ICD-10-CM | POA: Diagnosis present

## 2015-11-13 DIAGNOSIS — Z794 Long term (current) use of insulin: Secondary | ICD-10-CM

## 2015-11-13 DIAGNOSIS — D500 Iron deficiency anemia secondary to blood loss (chronic): Secondary | ICD-10-CM | POA: Diagnosis not present

## 2015-11-13 DIAGNOSIS — G4733 Obstructive sleep apnea (adult) (pediatric): Secondary | ICD-10-CM | POA: Diagnosis present

## 2015-11-13 DIAGNOSIS — M109 Gout, unspecified: Secondary | ICD-10-CM | POA: Diagnosis present

## 2015-11-13 DIAGNOSIS — Z7901 Long term (current) use of anticoagulants: Secondary | ICD-10-CM

## 2015-11-13 DIAGNOSIS — I129 Hypertensive chronic kidney disease with stage 1 through stage 4 chronic kidney disease, or unspecified chronic kidney disease: Secondary | ICD-10-CM | POA: Diagnosis not present

## 2015-11-13 DIAGNOSIS — K921 Melena: Secondary | ICD-10-CM

## 2015-11-13 DIAGNOSIS — N186 End stage renal disease: Secondary | ICD-10-CM | POA: Diagnosis not present

## 2015-11-13 DIAGNOSIS — E118 Type 2 diabetes mellitus with unspecified complications: Secondary | ICD-10-CM

## 2015-11-13 DIAGNOSIS — Z7902 Long term (current) use of antithrombotics/antiplatelets: Secondary | ICD-10-CM | POA: Diagnosis not present

## 2015-11-13 DIAGNOSIS — Z79899 Other long term (current) drug therapy: Secondary | ICD-10-CM | POA: Diagnosis not present

## 2015-11-13 DIAGNOSIS — I252 Old myocardial infarction: Secondary | ICD-10-CM

## 2015-11-13 DIAGNOSIS — E8809 Other disorders of plasma-protein metabolism, not elsewhere classified: Secondary | ICD-10-CM | POA: Diagnosis not present

## 2015-11-13 DIAGNOSIS — H5350 Unspecified color vision deficiencies: Secondary | ICD-10-CM | POA: Diagnosis present

## 2015-11-13 DIAGNOSIS — I5033 Acute on chronic diastolic (congestive) heart failure: Secondary | ICD-10-CM | POA: Diagnosis not present

## 2015-11-13 DIAGNOSIS — E119 Type 2 diabetes mellitus without complications: Secondary | ICD-10-CM | POA: Diagnosis present

## 2015-11-13 DIAGNOSIS — I2510 Atherosclerotic heart disease of native coronary artery without angina pectoris: Secondary | ICD-10-CM | POA: Diagnosis not present

## 2015-11-13 DIAGNOSIS — I5020 Unspecified systolic (congestive) heart failure: Secondary | ICD-10-CM | POA: Diagnosis not present

## 2015-11-13 DIAGNOSIS — R0902 Hypoxemia: Secondary | ICD-10-CM

## 2015-11-13 DIAGNOSIS — D62 Acute posthemorrhagic anemia: Secondary | ICD-10-CM | POA: Diagnosis not present

## 2015-11-13 HISTORY — DX: Non-ST elevation (NSTEMI) myocardial infarction: I21.4

## 2015-11-13 HISTORY — DX: Unspecified osteoarthritis, unspecified site: M19.90

## 2015-11-13 HISTORY — DX: Personal history of other medical treatment: Z92.89

## 2015-11-13 HISTORY — DX: Dependence on other enabling machines and devices: Z99.89

## 2015-11-13 HISTORY — DX: Angina pectoris, unspecified: I20.9

## 2015-11-13 HISTORY — DX: Chronic kidney disease, stage 3 unspecified: N18.30

## 2015-11-13 HISTORY — DX: Chronic kidney disease, stage 3 (moderate): N18.3

## 2015-11-13 HISTORY — DX: Obstructive sleep apnea (adult) (pediatric): G47.33

## 2015-11-13 HISTORY — DX: Type 2 diabetes mellitus without complications: E11.9

## 2015-11-13 HISTORY — DX: Exudative age-related macular degeneration, bilateral, stage unspecified: H35.3230

## 2015-11-13 LAB — PROTIME-INR
INR: 1.17 (ref 0.00–1.49)
PROTHROMBIN TIME: 15 s (ref 11.6–15.2)

## 2015-11-13 LAB — GLUCOSE, CAPILLARY
GLUCOSE-CAPILLARY: 140 mg/dL — AB (ref 65–99)
GLUCOSE-CAPILLARY: 120 mg/dL — AB (ref 65–99)
Glucose-Capillary: 150 mg/dL — ABNORMAL HIGH (ref 65–99)

## 2015-11-13 LAB — CBC WITH DIFFERENTIAL/PLATELET
BASOS ABS: 0 10*3/uL (ref 0.0–0.1)
Basophils Relative: 0 %
EOS PCT: 1 %
Eosinophils Absolute: 0.1 10*3/uL (ref 0.0–0.7)
HEMATOCRIT: 27.6 % — AB (ref 39.0–52.0)
HEMOGLOBIN: 9.2 g/dL — AB (ref 13.0–17.0)
LYMPHS ABS: 1.6 10*3/uL (ref 0.7–4.0)
LYMPHS PCT: 19 %
MCH: 30.6 pg (ref 26.0–34.0)
MCHC: 33.3 g/dL (ref 30.0–36.0)
MCV: 91.7 fL (ref 78.0–100.0)
Monocytes Absolute: 0.6 10*3/uL (ref 0.1–1.0)
Monocytes Relative: 7 %
NEUTROS ABS: 6.1 10*3/uL (ref 1.7–7.7)
NEUTROS PCT: 73 %
PLATELETS: 178 10*3/uL (ref 150–400)
RBC: 3.01 MIL/uL — AB (ref 4.22–5.81)
RDW: 15.3 % (ref 11.5–15.5)
WBC: 8.4 10*3/uL (ref 4.0–10.5)

## 2015-11-13 LAB — BASIC METABOLIC PANEL
ANION GAP: 10 (ref 5–15)
BUN: 35 mg/dL — AB (ref 6–20)
CHLORIDE: 102 mmol/L (ref 101–111)
CO2: 26 mmol/L (ref 22–32)
Calcium: 8.9 mg/dL (ref 8.9–10.3)
Creatinine, Ser: 2.33 mg/dL — ABNORMAL HIGH (ref 0.61–1.24)
GFR calc Af Amer: 29 mL/min — ABNORMAL LOW (ref >60)
GFR, EST NON AFRICAN AMERICAN: 25 mL/min — AB (ref >60)
Glucose, Bld: 203 mg/dL — ABNORMAL HIGH (ref 65–99)
POTASSIUM: 4.1 mmol/L (ref 3.5–5.1)
SODIUM: 138 mmol/L (ref 135–145)

## 2015-11-13 LAB — HEPATIC FUNCTION PANEL
ALBUMIN: 3.5 g/dL (ref 3.5–5.0)
ALK PHOS: 65 U/L (ref 38–126)
ALT: 27 U/L (ref 17–63)
AST: 31 U/L (ref 15–41)
Bilirubin, Direct: 0.1 mg/dL (ref 0.1–0.5)
Indirect Bilirubin: 0.5 mg/dL (ref 0.3–0.9)
TOTAL PROTEIN: 6.9 g/dL (ref 6.5–8.1)
Total Bilirubin: 0.6 mg/dL (ref 0.3–1.2)

## 2015-11-13 LAB — PREPARE RBC (CROSSMATCH)

## 2015-11-13 LAB — CBC
HCT: 28.6 % — ABNORMAL LOW (ref 39.0–52.0)
HEMOGLOBIN: 9.7 g/dL — AB (ref 13.0–17.0)
MCH: 31.2 pg (ref 26.0–34.0)
MCHC: 33.9 g/dL (ref 30.0–36.0)
MCV: 92 fL (ref 78.0–100.0)
Platelets: 203 10*3/uL (ref 150–400)
RBC: 3.11 MIL/uL — ABNORMAL LOW (ref 4.22–5.81)
RDW: 15.3 % (ref 11.5–15.5)
WBC: 10.2 10*3/uL (ref 4.0–10.5)

## 2015-11-13 LAB — OCCULT BLOOD X 1 CARD TO LAB, STOOL: Fecal Occult Bld: POSITIVE — AB

## 2015-11-13 MED ORDER — METOPROLOL TARTRATE 1 MG/ML IV SOLN: 2.5000 mg | INTRAVENOUS | Status: DC | PRN

## 2015-11-13 MED ORDER — PANTOPRAZOLE SODIUM 40 MG IV SOLR: 40.0000 mg | Freq: Two times a day (BID) | INTRAVENOUS | Status: DC

## 2015-11-13 MED ORDER — SODIUM CHLORIDE 0.9 % IV SOLN: INTRAVENOUS | Status: DC

## 2015-11-13 MED ORDER — INSULIN GLARGINE 100 UNIT/ML ~~LOC~~ SOLN
10.0000 [IU] | Freq: Every day | SUBCUTANEOUS | Status: DC
  Administered 2015-11-13 – 2015-11-20 (×7): 10 [IU] via SUBCUTANEOUS
  Filled 2015-11-13 (×9): qty 0.1

## 2015-11-13 MED ORDER — ACETAMINOPHEN 325 MG PO TABS
650.0000 mg | ORAL_TABLET | Freq: Four times a day (QID) | ORAL | Status: DC | PRN
  Administered 2015-11-18: 650 mg via ORAL
  Filled 2015-11-13: qty 2

## 2015-11-13 MED ORDER — ONDANSETRON HCL 4 MG PO TABS: 4.0000 mg | ORAL_TABLET | Freq: Four times a day (QID) | ORAL | Status: DC | PRN

## 2015-11-13 MED ORDER — SODIUM CHLORIDE 0.9 % IV SOLN
INTRAVENOUS | Status: DC
  Administered 2015-11-13: 17:00:00 via INTRAVENOUS

## 2015-11-13 MED ORDER — SODIUM CHLORIDE 0.9 % IJ SOLN
3.0000 mL | Freq: Two times a day (BID) | INTRAMUSCULAR | Status: DC
  Administered 2015-11-15 – 2015-11-20 (×9): 3 mL via INTRAVENOUS

## 2015-11-13 MED ORDER — ACETAMINOPHEN 650 MG RE SUPP
650.0000 mg | Freq: Four times a day (QID) | RECTAL | Status: DC | PRN
Start: 2015-11-13 — End: 2015-11-21

## 2015-11-13 MED ORDER — SODIUM CHLORIDE 0.9 % IV SOLN: Freq: Once | INTRAVENOUS | Status: DC

## 2015-11-13 MED ORDER — PANTOPRAZOLE SODIUM 40 MG IV SOLR
40.0000 mg | INTRAVENOUS | Status: DC
  Administered 2015-11-13 – 2015-11-16 (×3): 40 mg via INTRAVENOUS
  Filled 2015-11-13 (×3): qty 40

## 2015-11-13 MED ORDER — ONDANSETRON HCL 4 MG/2ML IJ SOLN: 4.0000 mg | Freq: Four times a day (QID) | INTRAMUSCULAR | Status: DC | PRN

## 2015-11-13 MED ORDER — RANOLAZINE ER 500 MG PO TB12
500.0000 mg | ORAL_TABLET | Freq: Two times a day (BID) | ORAL | Status: DC
  Administered 2015-11-13 – 2015-11-21 (×16): 500 mg via ORAL
  Filled 2015-11-13 (×16): qty 1

## 2015-11-13 MED ORDER — OXYCODONE HCL 5 MG PO TABS: 5.0000 mg | ORAL_TABLET | ORAL | Status: DC | PRN

## 2015-11-13 MED ORDER — INSULIN ASPART 100 UNIT/ML ~~LOC~~ SOLN
0.0000 [IU] | SUBCUTANEOUS | Status: DC
  Administered 2015-11-13 – 2015-11-14 (×2): 2 [IU] via SUBCUTANEOUS

## 2015-11-13 MED ORDER — PEG-KCL-NACL-NASULF-NA ASC-C 100 G PO SOLR
1.0000 | Freq: Once | ORAL | Status: AC
  Administered 2015-11-13 – 2015-11-14 (×2): 200 g via ORAL
  Filled 2015-11-13: qty 1

## 2015-11-13 NOTE — H&P (Signed)
[facility] History and Physical  [name] [id number] DOB: 07-11-1932 DOA: 11/13/2015  Referring physician:  PCP: [name], MD   Chief Complaint: GI bleed  HPI: [name] is a 81 y.o. male with a past medical history of coronary artery disease status post percutaneous intervention in July 2015 with stenting to high grade obtuse marginal branch of vein graft. Patient was felt to be a Plavix nonresponder for which she was treated with Brilinta in addition to aspirin therapy. He also has a history of paroxysmal atrial fibrillation and previously had been anticoagulated with Coumadin which was discontinued due to GI bleed. She reports hospitalized about 5 years ago for GI bleed undergoing upper and lower endoscopy. At the time he required blood transfusions. It was felt that GI bleed represented diverticular bleed. He presents to the emergency department today with complaints of bright red blood per rectum associate with dark colored stools. He also complains of generalized abdominal pain. Symptoms starting 2 days ago. He reports associated chest tightness, poor tolerance to physical exertion, palpitations and shortness of breath. He is currently on Brilinta and aspirin therapy. Denies taking anticoagulants, NSAIDS, BC powders, Goody powders. He denies hematemesis. Lab work in the emergency department showing hemoglobin of 9.2. GI was consulted.                         Review of Systems:  Constitutional:  No weight loss, night sweats, Fevers, chills, positive for fatigue and poor tolerance to physical exertion.  HEENT:  No headaches, Difficulty swallowing,Tooth/dental problems,Sore throat,  No sneezing, itching, ear ache, nasal congestion, post nasal drip,  Cardio-vascular:  Positive for palpitations and chest pain, Orthopnea, PND, swelling in lower extremities, anasarca, dizziness,   GI:  No heartburn, indigestion, positive for abdominal pain, nausea, vomiting, diarrhea,  change in bowel habits, loss of appetite  Resp:  Positive shortness of breath with exertion or at rest. No excess mucus, no productive cough, No non-productive cough, No coughing up of blood.No change in color of mucus.No wheezing.No chest wall deformity  Skin:  no rash or lesions.  GU:  no dysuria, change in color of urine, no urgency or frequency. No flank pain.  Musculoskeletal:  No joint pain or swelling. No decreased range of motion. No back pain.  Psych:  No change in mood or affect. No depression or anxiety. No memory loss.   Past Medical History  Diagnosis Date  . GERD (gastroesophageal reflux disease)   . Anemia     Secondary to acute blood loss  . Hypertension   . Hyperlipidemia   . Diabetes mellitus     Type II  . Diabetic peripheral neuropathy ([place])   . RBBB (right bundle branch block)   . Macular degeneration   . Sleep apnea   . Carotid artery disease ([place])     Doppler 09/18/2009 - 49% bilateral stenoses  . Personal history of colonic polyps   . Diverticulosis of colon with hemorrhage 2009    several unit diverticular bleed   . Erectile dysfunction     Mild  . PNA (pneumonia) 9/11    NSTEMI at [facility] with repeat cath, rec medical mgmt   . Atrial flutter [facility]) 07/2010    September, 2011   Hospital with PNA and cath done.Marland KitchenMarland KitchenCoumadin.  Atrial flutter ablation planned, but  pt. then had atrial fibrillation,/outpatient conversion 09/08/10..NSR..plan to follow..Dr. Caryl Comes  . Chronic kidney disease     ckd #3  .  Gout   . Skin cancer     R lower leg, per derm 2012  . Atrial fibrillation ([place])     Consideration was given for atrial flutter ablation, but patient developed atrial fibrillation. Cardioversion was done. Dr. Caryl Comes decided to watch him clinically. November, 2011  . Carotid artery disease ([name])     49% bilateral, Doppler, November, 2010  . CAD (coronary artery disease)     Catheterization, September, 2011,  grafts patent from redo CABG,, medical therapy of coronary  disease, consideration to proceeding with atrial flutter ablation  . Ejection fraction     EF 60%, echo, 2009  //   EF 65%, echo, September, 2011  . Mitral regurgitation     Mild, echo, September, 2011  . Shoulder pain     Decreasing Crestor did not affect the pain   Past Surgical History  Procedure Laterality Date  . Cardiac catheterization  2006    Nuclear..slight lateral ischemia..medical therapy  . Cardiac catheterization  08/04/2010    grafts patent from redo CABG...medical Rx and ablate Atrial flutter (LV not injected)   . Doppler echocardiography  08/2008    EF 60%  . Doppler echocardiography  08/02/2010    65-70%  . Doppler echocardiography  07/2010    MR mild  . Coronary artery bypass graft  1995     Redo  CABG 2006  . Polypectomy    . Cataract extraction    . Vasectomy    . Tonsillectomy    . Carotid endarterectomy    . Coronary artery bypass graft  1995, 2006  . Cholecystectomy  2008  . Esophagogastroduodenoscopy N/A 06/19/2014    Procedure: ESOPHAGOGASTRODUODENOSCOPY (EGD);  Surgeon: Jerene Bears, MD;  Location: [facility] ENDOSCOPY;  Service: Endoscopy;  Laterality: N/A;  . Left heart catheterization with coronary/graft angiogram N/A 06/11/2014    Procedure: LEFT HEART CATHETERIZATION WITH Beatrix Fetters;  Surgeon: Sinclair Grooms, MD;  Location[facility] CATH LAB;  Service: Cardiovascular;  Laterality: N/A;  . Percutaneous coronary stent intervention (pci-s) N/A 06/13/2014    Procedure: PERCUTANEOUS CORONARY STENT INTERVENTION (PCI-S);  Surgeon: Sinclair Grooms, MD;  Location: [facility] CATH LAB;  Service: Cardiovascular;  Laterality: N/A;  . Left heart cath N/A 06/14/2014    Procedure: LEFT HEART CATH;  Surgeon: Sinclair Grooms, MD;  Location: [facility] CATH LAB;  Service: Cardiovascular;  Laterality: N/A;   Social History:  reports that he quit smoking about 59 years ago. He has never used smokeless tobacco. He reports that he does not drink alcohol or use illicit drugs.  No Known  Allergies  Family History  Problem Relation Age of Onset  . Kidney disease Mother     Kidney failure  . Stroke Mother   . Diabetes Mother   . Heart disease Father     MI  . Arthritis Sister   . Cancer Sister     Throat  . Heart disease Sister     MI  . Diabetes Sister   . Prostate cancer Neg Hx   . Colon cancer Neg Hx      Prior to Admission medications   Medication Sig Start Date End Date Taking? Authorizing Provider  amLODipine (NORVASC) 10 MG tablet Take 1 tablet (10 mg total) by mouth every evening. 10/01/15   [name], MD  aspirin EC 81 MG EC tablet Take 1 tablet (81 mg total) by mouth daily. 06/19/14   Almyra Deforest, PA  Blood Glucose Monitoring Suppl (  FREESTYLE LITE) DEVI Use to check blood sugar once daily and as needed.  Diagnosis:  E11.40  Insulin dependent. 05/22/15   Tonia Ghent, MD  BRILINTA 90 MG TABS tablet TAKE 1 TABLET TWICE A DAY (PLEASE CALL AND SCHEDULE AN APPOINTMENT) 09/21/15   Carlena Bjornstad, MD  cephALEXin (KEFLEX) 500 MG capsule Take 1 capsule (500 mg total) by mouth 3 (three) times daily. 11/03/15   Jearld Fenton, NP  Cholecalciferol (VITAMIN D) 1000 UNITS capsule Take 1,000 Units by mouth daily.     Historical Provider, MD  colchicine 0.6 MG tablet Take 0.6 mg by mouth 3 (three) times a week. For gout flare ups    Historical Provider, MD  fish oil-omega-3 fatty acids 1000 MG capsule Take 1 g by mouth daily.     Historical Provider, MD  furosemide (LASIX) 80 MG tablet TAKE 1 TABLET DAILY 10/06/15   Carlena Bjornstad, MD  glucose blood (FREESTYLE LITE) test strip Use as instructed to test blood sugar once daily and as needed.  Diagnosis:  E11.40  Insulin Dependent. 05/22/15   Tonia Ghent, MD  insulin glargine (LANTUS) 100 UNIT/ML injection INJECT 25 UNITS UNDER THE SKIN DAILY AS INSTRUCTED BY DOCTOR 04/28/15   Tonia Ghent, MD  Insulin Syringe-Needle U-100 (INSULIN SYRINGE .3CC/31GX5/16") 31G X 5/16" 0.3 ML MISC Use daily as instructed. 12/26/14   Tonia Ghent, MD  isosorbide mononitrate (IMDUR) 30 MG 24 hr tablet TAKE 2 TABLETS TWICE A DAY 08/24/15   Carlena Bjornstad, MD  magnesium oxide (MAG-OX) 400 MG tablet Take 400 mg by mouth daily.     Historical Provider, MD  Multiple Vitamin (MULTIVITAMIN WITH MINERALS) TABS Take 1 tablet by mouth daily.    Historical Provider, MD  nitroGLYCERIN (NITROSTAT) 0.4 MG SL tablet Place 1 tablet (0.4 mg total) under the tongue every 5 (five) minutes as needed for chest pain. 09/04/15   [name], MD  Potassium Gluconate (K-99) 595 MG CAPS Take 595 mg by mouth every evening.     Historical Provider, MD  ranolazine (RANEXA) 500 MG 12 hr tablet Take 1 tablet (500 mg total) by mouth 2 (two) times daily. 09/28/15   [name], MD  rosuvastatin (CRESTOR) 20 MG tablet Take 1 tablet (20 mg total) by mouth daily. 09/28/15   Carlena Bjornstad, MD  TOPROL XL 50 MG 24 hr tablet TAKE 1 TABLET DAILY WITH A MEAL OR IMMEDIATELY FOLLOWING A MEAL 10/06/15   Carlena Bjornstad, MD  traMADol (ULTRAM) 50 MG tablet Take 1 tablet (50 mg total) by mouth every 6 (six) hours as needed. ARTHRITIS 05/24/15   Tonia Ghent, MD   Physical Exam: Filed Vitals:   11/13/15 1345 11/13/15 1405 11/13/15 1500 11/13/15 1600  BP:  148/47 123/64 129/65  Pulse:  71 66 66  Temp:  98.2 F (36.8 C)    TempSrc:  Oral    Resp:   18 15  SpO2: 96% 100% 99% 100%    Wt Readings from Last 3 Encounters:  11/03/15 86.637 kg (191 lb)  09/02/15 89.359 kg (197 lb)  08/31/15 88.338 kg (194 lb 12 oz)    General:  Appears calm and comfortable, no acute distress Eyes: PERRL, normal lids, irises & conjunctiva ENT: grossly normal hearing, lips & tongue, dry oral mucosa Neck: no LAD, masses or thyromegaly Cardiovascular: RRR, no m/r/g. No LE edema. Telemetry: SR, no arrhythmias  Respiratory: CTA bilaterally, no w/r/r. Normal respiratory effort.  Abdomen: soft, he has mild pain to palpation across generalized abdomen Skin: no rash or induration seen on  limited exam Musculoskeletal: grossly normal tone BUE/BLE Psychiatric: grossly normal mood and affect, speech fluent and appropriate Neurologic: grossly non-focal.          Labs on Admission:  Basic Metabolic Panel:  Recent Labs Lab 11/13/15 1414  NA 138  K 4.1  CL 102  CO2 26  GLUCOSE 203*  BUN 35*  CREATININE 2.33*  CALCIUM 8.9   Liver Function Tests: No results for input(s): AST, ALT, ALKPHOS, BILITOT, PROT, ALBUMIN in the last 168 hours. No results for input(s): LIPASE, AMYLASE in the last 168 hours. No results for input(s): AMMONIA in the last 168 hours. CBC:  Recent Labs Lab 11/13/15 1414  WBC 8.4  NEUTROABS 6.1  HGB 9.2*  HCT 27.6*  MCV 91.7  PLT 178   Cardiac Enzymes: No results for input(s): CKTOTAL, CKMB, CKMBINDEX, TROPONINI in the last 168 hours.  BNP (last 3 results) No results for input(s): BNP in the last 8760 hours.  ProBNP (last 3 results) No results for input(s): PROBNP in the last 8760 hours.  CBG: No results for input(s): GLUCAP in the last 168 hours.  Radiological Exams on Admission: No results found.  EKG: Independently reviewed.   Assessment/Plan Principal Problem:   GI bleed Active Problems:   Symptomatic anemia   Type 2 diabetes mellitus with peripheral neuropathy ([name])   CAD (coronary artery disease)   Hx of CABG   History of gastrointestinal bleeding   Acute-on-chronic kidney injury ([place])   1. Possible lower GI bleed. Patient having a history of lower GI bleed, undergoing previous colonoscopies (last one performed in 2009) that showed diverticulosis and hemorrhoids. He presents with complaints of bright red blood per rectum as well as dark stools that started 2 days ago. Given history of suspect diverticular bleed. Case was discussed with Dr. Havery Moros of GI who will evaluate patient in consultation. Will discontinue his Aspirin and Brilinta, as it has been nearly a year and half since he underwent percutaneous  intervention. Will monitor serial H&H checks. Check a PT/INR. Provide supportive care, admitted to telemetry. Await further recommendations from GI. Will discontinue antihypertensive agents for now given concerns for precipitating hypotension in setting of GI bleed 2.  History of coronary artery disease. Mr. Debaere having history of coronary artery disease status post coronary artery bypass grafting in 1996 and 2006. He underwent percutaneous intervention in July 2015 to vein graft and subsequently placed on aspirin and Brilinta. He has been deemed Plavix nonresponder. Since he presents with GI bleed and symptomatic anemia will discontinue dual agent antiplatelet therapy for now. Courtesy call made to cardiology.  3. Symptomatic anemia. Patient presenting with GI bleed reporting associated shortness of breath, poor tolerance to physical exertion, fatigue and chest pain. He has a history of coronary artery disease. Will type and cross and transfuse 1 unit of packed red blood cells. 4. History of diastolic congestive heart failure. Last transthoracic echocardiogram performed 06/14/2014 that revealed ejection fraction of 60-65% with grade 2 diastolic dysfunction. He had been on Lasix therapy at home which will be held as I suspect he is on depleted from GI bleed. Volume status will be monitored closely particularly as he'll be transfused one unit of packed red blood cells. 5. Acute on chronic renal failure. Mr. Poteat having a history of stage III chronic kidney disease with baseline creatinine near 1.8 to 2.0. Labs showing creatinine of 2.33.  Suspect phone depletion in setting of GI bleed. As mentioned above will hold Lasix therapy. He'll be transfused one unit of packed red blood cells. 6. Diabetes mellitus. Patient will be made nothing by mouth for which Lantus will be decreased to 10 units subcutaneous daily. Will cover with sliding-scale insulin every 4 hours. 7. Hypertension. Patient presenting with GI  bleed, will hold antihypertensive agents. Will monitor blood pressures overnight.  Code Status: Full code DVT Prophylaxis: SCDs Family Communication: Spoke to his fiance who was present at bedside  Disposition Plan: Will admit to inpatient service, anticipate will require greater than 2 nights hospitalization  Time spent: 70 min  Kelvin Cellar [facility] Pager [phone]

## 2015-11-13 NOTE — ED Notes (Addendum)
Rectal bleeding for two days; dark red, about 4 episodes per day. History of the same with diagnosis of diverticular issue. Denies any pain, reports mild nausea, no vomiting. On Brillinta, stopped taking it on Wednesday when bleeding started.

## 2015-11-13 NOTE — Telephone Encounter (Signed)
Patient Name: [name] DOB: 07-15-1932 Initial Comment Caller states they are having abdominal pain and bloody/black stool. Nurse Assessment Nurse: Vallery Sa, RN, [place] Date/Time (Eastern Time): 11/13/2015 12:38:15 PM Confirm and document reason for call. If symptomatic, describe symptoms. ---Caller states he developed abdominal discomfort blood in his stool and black tarry stool for the past 2 days. No injury in the past 3 days. No fever. Has the patient traveled out of the country within the last 30 days? ---No Does the patient have any new or worsening symptoms? ---Yes Will a triage be completed? ---Yes Related visit to physician within the last 2 weeks? ---No Does the PT have any chronic conditions? (i.e. diabetes, asthma, etc.) ---Yes List chronic conditions. ---Cardiac Bypass, carotid Artery problems, Diverticular bleeding, Diabetes Is this a behavioral health or substance abuse call? ---No Guidelines Guideline Title Affirmed Question Affirmed Notes Rectal Bleeding SEVERE rectal bleeding (large blood clots; on and off, or constant bleeding) Final Disposition User Go to ED Now Vallery Sa, RN, [facility] - ED Disagree/Comply: Comply

## 2015-11-13 NOTE — ED Provider Notes (Signed)
CSN: [id number]     Arrival date & time 11/13/15  1344 History   First MD Initiated Contact with Patient 11/13/15 1348     Chief Complaint  Patient presents with  . Rectal Bleeding     (Consider location/radiation/quality/duration/timing/severity/associated sxs/prior Treatment) HPI Comments: 81 year old male with extensive past medical history including IDDM, CAD, hypertension, carotid artery disease who presents with rectal bleeding. Patient states that 2 days ago he began having dark red blood in his stool. He has had approximately 4 bloody bowel movements each day since it started. He reports a history of hemorrhoids and states that he was straining earlier this week but he denies any pain related to his hemorrhoids and has not ever had any problems with them bleeding. He states that the blood is not just on the stool; it fills the entire toilet bowl. He denies any abdominal pain, rectal pain, vomiting, fevers, or recent illness. He did stop taking Brilinta 2d ago when this started. He has a distant history of rectal bleeding thought to be related to diverticular disease; in the past and they've never found a bleeding source on colonoscopy. Patient endorses one episode of lightheadedness when he stood up and walked to the kitchen last night but he otherwise feels well.  Patient is a 81 y.o. male presenting with hematochezia. The history is provided by the patient.  Rectal Bleeding   Past Medical History  Diagnosis Date  . GERD (gastroesophageal reflux disease)   . Anemia     Secondary to acute blood loss  . Hypertension   . Hyperlipidemia   . Diabetes mellitus     Type II  . Diabetic peripheral neuropathy ([place])   . RBBB (right bundle branch block)   . Macular degeneration   . Sleep apnea   . Carotid artery disease ([place])     Doppler 09/18/2009 - 49% bilateral stenoses  . Personal history of colonic polyps   . Diverticulosis of colon with hemorrhage 2009    several unit diverticular  bleed   . Erectile dysfunction     Mild  . PNA (pneumonia) 9/11    NSTEMI at [facility] with repeat cath, rec medical mgmt   . Atrial flutter [facility]) 07/2010    September, 2011   Hospital with PNA and cath done.Marland KitchenMarland KitchenCoumadin.  Atrial flutter ablation planned, but  pt. then had atrial fibrillation,/outpatient conversion 09/08/10..NSR..plan to follow..Dr. Caryl Comes  . Chronic kidney disease     ckd #3  . Gout   . Skin cancer     R lower leg, per derm 2012  . Atrial fibrillation ([place])     Consideration was given for atrial flutter ablation, but patient developed atrial fibrillation. Cardioversion was done. Dr. Caryl Comes decided to watch him clinically. November, 2011  . Carotid artery disease ([name])     49% bilateral, Doppler, November, 2010  . CAD (coronary artery disease)     Catheterization, September, 2011,  grafts patent from redo CABG,, medical therapy of coronary disease, consideration to proceeding with atrial flutter ablation  . Ejection fraction     EF 60%, echo, 2009  //   EF 65%, echo, September, 2011  . Mitral regurgitation     Mild, echo, September, 2011  . Shoulder pain     Decreasing Crestor did not affect the pain   Past Surgical History  Procedure Laterality Date  . Cardiac catheterization  2006    Nuclear..slight lateral ischemia..medical therapy  . Cardiac catheterization  08/04/2010    grafts patent  from redo CABG...medical Rx and ablate Atrial flutter (LV not injected)   . Doppler echocardiography  08/2008    EF 60%  . Doppler echocardiography  08/02/2010    65-70%  . Doppler echocardiography  07/2010    MR mild  . Coronary artery bypass graft  1995     Redo  CABG 2006  . Polypectomy    . Cataract extraction    . Vasectomy    . Tonsillectomy    . Carotid endarterectomy    . Coronary artery bypass graft  1995, 2006  . Cholecystectomy  2008  . Esophagogastroduodenoscopy N/A 06/19/2014    Procedure: ESOPHAGOGASTRODUODENOSCOPY (EGD);  Surgeon: Jerene Bears, MD;  Location: [facility]  ENDOSCOPY;  Service: Endoscopy;  Laterality: N/A;  . Left heart catheterization with coronary/graft angiogram N/A 06/11/2014    Procedure: LEFT HEART CATHETERIZATION WITH Beatrix Fetters;  Surgeon: Sinclair Grooms, MD;  Location: [facility] CATH LAB;  Service: Cardiovascular;  Laterality: N/A;  . Percutaneous coronary stent intervention (pci-s) N/A 06/13/2014    Procedure: PERCUTANEOUS CORONARY STENT INTERVENTION (PCI-S);  Surgeon: Sinclair Grooms, MD;  Location: [facility] CATH LAB;  Service: Cardiovascular;  Laterality: N/A;  . Left heart cath N/A 06/14/2014    Procedure: LEFT HEART CATH;  Surgeon: Sinclair Grooms, MD;  Location: [facility] CATH LAB;  Service: Cardiovascular;  Laterality: N/A;   Family History  Problem Relation Age of Onset  . Kidney disease Mother     Kidney failure  . Stroke Mother   . Diabetes Mother   . Heart disease Father     MI  . Arthritis Sister   . Cancer Sister     Throat  . Heart disease Sister     MI  . Diabetes Sister   . Prostate cancer Neg Hx   . Colon cancer Neg Hx    Social History  Substance Use Topics  . Smoking status: Former Smoker    Quit date: 11/14/1956  . Smokeless tobacco: Never Used  . Alcohol Use: No    Review of Systems  Gastrointestinal: Positive for hematochezia.    10 Systems reviewed and are negative for acute change except as noted in the HPI.   Allergies  Review of patient's allergies indicates no known allergies.  Home Medications   Prior to Admission medications   Medication Sig Start Date End Date Taking? Authorizing Provider  amLODipine (NORVASC) 10 MG tablet Take 1 tablet (10 mg total) by mouth every evening. 10/01/15   [name], MD  aspirin EC 81 MG EC tablet Take 1 tablet (81 mg total) by mouth daily. 06/19/14   Almyra Deforest, PA  Blood Glucose Monitoring Suppl (FREESTYLE LITE) DEVI Use to check blood sugar once daily and as needed.  Diagnosis:  E11.40  Insulin dependent. 05/22/15   Tonia Ghent, MD  BRILINTA 90 MG  TABS tablet TAKE 1 TABLET TWICE A DAY (PLEASE CALL AND SCHEDULE AN APPOINTMENT) 09/21/15   Carlena Bjornstad, MD  cephALEXin (KEFLEX) 500 MG capsule Take 1 capsule (500 mg total) by mouth 3 (three) times daily. 11/03/15   Jearld Fenton, NP  Cholecalciferol (VITAMIN D) 1000 UNITS capsule Take 1,000 Units by mouth daily.     Historical Provider, MD  colchicine 0.6 MG tablet Take 0.6 mg by mouth 3 (three) times a week. For gout flare ups    Historical Provider, MD  fish oil-omega-3 fatty acids 1000 MG capsule Take 1 g by mouth daily.  Historical Provider, MD  furosemide (LASIX) 80 MG tablet TAKE 1 TABLET DAILY 10/06/15   Carlena Bjornstad, MD  glucose blood (FREESTYLE LITE) test strip Use as instructed to test blood sugar once daily and as needed.  Diagnosis:  E11.40  Insulin Dependent. 05/22/15   Tonia Ghent, MD  insulin glargine (LANTUS) 100 UNIT/ML injection INJECT 25 UNITS UNDER THE SKIN DAILY AS INSTRUCTED BY DOCTOR 04/28/15   Tonia Ghent, MD  Insulin Syringe-Needle U-100 (INSULIN SYRINGE .3CC/31GX5/16") 31G X 5/16" 0.3 ML MISC Use daily as instructed. 12/26/14   Tonia Ghent, MD  isosorbide mononitrate (IMDUR) 30 MG 24 hr tablet TAKE 2 TABLETS TWICE A DAY 08/24/15   Carlena Bjornstad, MD  magnesium oxide (MAG-OX) 400 MG tablet Take 400 mg by mouth daily.     Historical Provider, MD  Multiple Vitamin (MULTIVITAMIN WITH MINERALS) TABS Take 1 tablet by mouth daily.    Historical Provider, MD  nitroGLYCERIN (NITROSTAT) 0.4 MG SL tablet Place 1 tablet (0.4 mg total) under the tongue every 5 (five) minutes as needed for chest pain. 09/04/15   [name], MD  Potassium Gluconate (K-99) 595 MG CAPS Take 595 mg by mouth every evening.     Historical Provider, MD  ranolazine (RANEXA) 500 MG 12 hr tablet Take 1 tablet (500 mg total) by mouth 2 (two) times daily. 09/28/15   [name], MD  rosuvastatin (CRESTOR) 20 MG tablet Take 1 tablet (20 mg total) by mouth daily. 09/28/15   Carlena Bjornstad, MD   TOPROL XL 50 MG 24 hr tablet TAKE 1 TABLET DAILY WITH A MEAL OR IMMEDIATELY FOLLOWING A MEAL 10/06/15   Carlena Bjornstad, MD  traMADol (ULTRAM) 50 MG tablet Take 1 tablet (50 mg total) by mouth every 6 (six) hours as needed. ARTHRITIS 05/24/15   Tonia Ghent, MD   BP 123/64 mmHg  Pulse 66  Temp(Src) 98.2 F (36.8 C) (Oral)  Resp 18  SpO2 99% Physical Exam  Constitutional: He is oriented to person, place, and time. He appears well-developed and well-nourished. No distress.  HENT:  Head: Normocephalic and atraumatic.  Moist mucous membranes  Eyes: Pupils are equal, round, and reactive to light.  Pale conjunctivae  Neck: Neck supple.  Cardiovascular: Normal rate, regular rhythm and normal heart sounds.   No murmur heard. Pulmonary/Chest: Effort normal and breath sounds normal.  Abdominal: Soft. Bowel sounds are normal. He exhibits no distension. There is no tenderness.  Genitourinary:  Gross dark red blood on rectal exam; 1 small non-thrombosed external hemorrhoid  Musculoskeletal: He exhibits no edema.  Neurological: He is alert and oriented to person, place, and time.  Fluent speech  Skin: Skin is warm and dry.  Psychiatric: He has a normal mood and affect. Judgment normal.  Nursing note and vitals reviewed.   ED Course  Procedures (including critical care time) Labs Review Labs Reviewed  BASIC METABOLIC PANEL - Abnormal; Notable for the following:    Glucose, Bld 203 (*)    BUN 35 (*)    Creatinine, Ser 2.33 (*)    GFR calc non Af Amer 25 (*)    GFR calc Af Amer 29 (*)    All other components within normal limits  CBC WITH DIFFERENTIAL/PLATELET - Abnormal; Notable for the following:    RBC 3.01 (*)    Hemoglobin 9.2 (*)    HCT 27.6 (*)    All other components within normal limits  OCCULT BLOOD X 1  CARD TO LAB, STOOL - Abnormal; Notable for the following:    Fecal Occult Bld POSITIVE (*)    All other components within normal limits  TYPE AND SCREEN    Imaging  Review No results found. I have personally reviewed and evaluated these lab results as part of my medical decision-making.   EKG Interpretation None      MDM   Final diagnoses:  None  GI bleed Acute on chronic kidney injury   Pt p/w 2 days of painless rectal bleeding. On exam, he was well-appearing w/ reassuring VS. No abd tenderness. Gross blood on rectal exam. Obtained above labs including type and screen. Hemoglobin 9.2, which is slightly decreased from previous of 10. Creatinine 2.33 which is elevated from baseline. Given the patient's gross blood on exam, a KI, and mildly worsened anemia, I feel he needs admission for serial lab work and colonoscopy. The remainder of his labs are stable. I have consulted [facility], Azucena Freed, who will see pt in consultation. Discussed w/ hospitalist, Dr. Coralyn Pear, and patient admitted for further care.  Sharlett Iles, MD 11/13/15 [phone]

## 2015-11-13 NOTE — Telephone Encounter (Signed)
Noted, will await ER notes.  Thanks.

## 2015-11-13 NOTE — Consult Note (Signed)
[facility] Consult: 3:53 PM 11/13/2015     Referring Provider: Emergency department M.D., [place] Primary Care Physician:  [name], MD Primary Gastroenterologist:  Dr. Hilarie Fredrickson, prior to that he was a patient of Dr. Casper Harrison.    Reason for Consultation:  Hematochezia.   HPI: [name] is a 81 y.o. male.  Type 2 DM.  CKD.  On Dilantin and aspirin for atrial fibrillation.  CAD. Status post CABG in 1995 and 2006.  Cardiac stenting in 05/2014.  Anemia. History of lower GI, diverticular bleeding which dates back to the late 1980s. He thinks he's had about 4 episodes which required hospitalization during his lifetime.  During the 08/2008 diverticular bleed, there was a stuttering pattern over a period of 2-3 weeks and he ended up getting in excess of 15 units of packed cells transfused.  Nuclear tagged red cell scan then was negative   06/2014 EGD for anemia and heme positive stools. Study was normal. 2006 and 2009 colonoscopy revealed hemorrhoids and diverticulosis. 2009 colonoscopy occurred when he was having a painless lower GI bleed and showed pandiverticulosis with ongoing bleeding, fresh blood in the colon. Dr. Carlean Purl was unable to isolate the locus of the bleeding. He had grade 2 internal hemorrhoids.  At 10 PM on 11/11/2015 the patient had acute, painless hematochezia. Before that, in the last few days he had felt like he was straining a bit more to have a bowel movement but was having brown, formed stools. Since the initial bout of hematochezia, he had 4 episodes yesterday, all of which revealed the commode water with red blood. Note is made that the patient has color blindness and sees red as the color black. He has only had one of these stools today,a couple of hours ago, the volume  of this was significantly decreased compared with yesterday. Although he has some tenderness in the abdomen with exam, he does not have abdominal pain. No nausea. No dizziness. No new dyspnea. Once the bleeding started, he stopped the Brilinta so last dose was in the morning of 12/28. He did take his low-dose aspirin yesterday.  In the ED his hemoglobin is 9.2 with MCV in the 90s. In October 2015 his hemoglobin was 10.9. Platelets are okay. Chemistries reveal some acute kidney injury with BUN 35, creatinine 2.3.   Interestingly the patient says he has more minor incidences of hematochezia once or twice a year that last 24-36 hours and subside on their own. He is stoic about things and stays home when these occur. Since this current bleeding bleeding didn't stop in a timely manner, he sought medical attention in the ED.   Past Medical History  Diagnosis Date  . GERD (gastroesophageal reflux disease)   . Anemia     Secondary to acute blood loss  . Hypertension   . Hyperlipidemia   . Diabetes mellitus     Type II  . Diabetic peripheral neuropathy ([place])   . RBBB (right bundle branch block)   . Macular degeneration   . Sleep apnea   .  Carotid artery disease ([place])     Doppler 09/18/2009 - 49% bilateral stenoses  . Personal history of colonic polyps   . Diverticulosis of colon with hemorrhage 2009    several unit diverticular bleed   . Erectile dysfunction     Mild  . PNA (pneumonia) 9/11    NSTEMI at [facility] with repeat cath, rec medical mgmt   . Atrial flutter [facility]) 07/2010    September, 2011   Hospital with PNA and cath done.Marland KitchenMarland KitchenCoumadin.  Atrial flutter ablation planned, but  pt. then had atrial fibrillation,/outpatient conversion 09/08/10..NSR..plan to follow..Dr. Caryl Comes  . Chronic kidney disease     ckd #3  . Gout   . Skin cancer     R lower leg, per derm 2012  . Atrial fibrillation ([place])     Consideration was given for atrial flutter ablation, but patient developed atrial fibrillation.  Cardioversion was done. Dr. Caryl Comes decided to watch him clinically. November, 2011  . Carotid artery disease ([name])     49% bilateral, Doppler, November, 2010  . CAD (coronary artery disease)     Catheterization, September, 2011,  grafts patent from redo CABG,, medical therapy of coronary disease, consideration to proceeding with atrial flutter ablation  . Ejection fraction     EF 60%, echo, 2009  //   EF 65%, echo, September, 2011  . Mitral regurgitation     Mild, echo, September, 2011  . Shoulder pain     Decreasing Crestor did not affect the pain    Past Surgical History  Procedure Laterality Date  . Cardiac catheterization  2006    Nuclear..slight lateral ischemia..medical therapy  . Cardiac catheterization  08/04/2010    grafts patent from redo CABG...medical Rx and ablate Atrial flutter (LV not injected)   . Doppler echocardiography  08/2008    EF 60%  . Doppler echocardiography  08/02/2010    65-70%  . Doppler echocardiography  07/2010    MR mild  . Coronary artery bypass graft  1995     Redo  CABG 2006  . Polypectomy    . Cataract extraction    . Vasectomy    . Tonsillectomy    . Carotid endarterectomy    . Coronary artery bypass graft  1995, 2006  . Cholecystectomy  2008  . Esophagogastroduodenoscopy N/A 06/19/2014    Procedure: ESOPHAGOGASTRODUODENOSCOPY (EGD);  Surgeon: Jerene Bears, MD;  Location: [facility] ENDOSCOPY;  Service: Endoscopy;  Laterality: N/A;  . Left heart catheterization with coronary/graft angiogram N/A 06/11/2014    Procedure: LEFT HEART CATHETERIZATION WITH Beatrix Fetters;  Surgeon: Sinclair Grooms, MD;  Location: [facility] CATH LAB;  Service: Cardiovascular;  Laterality: N/A;  . Percutaneous coronary stent intervention (pci-s) N/A 06/13/2014    Procedure: PERCUTANEOUS CORONARY STENT INTERVENTION (PCI-S);  Surgeon: Sinclair Grooms, MD;  Location[facility] CATH LAB;  Service: Cardiovascular;  Laterality: N/A;  . Left heart cath N/A 06/14/2014    Procedure: LEFT  HEART CATH;  Surgeon: Sinclair Grooms, MD;  Location: [facility] CATH LAB;  Service: Cardiovascular;  Laterality: N/A;    Prior to Admission medications   Medication Sig Start Date End Date Taking? Authorizing Provider  amLODipine (NORVASC) 10 MG tablet Take 1 tablet (10 mg total) by mouth every evening. 10/01/15   [name], MD  aspirin EC 81 MG EC tablet Take 1 tablet (81 mg total) by mouth daily. 06/19/14   Almyra Deforest, PA  Blood Glucose Monitoring Suppl (FREESTYLE LITE) DEVI Use to  check blood sugar once daily and as needed.  Diagnosis:  E11.40  Insulin dependent. 05/22/15   Tonia Ghent, MD  BRILINTA 90 MG TABS tablet TAKE 1 TABLET TWICE A DAY (PLEASE CALL AND SCHEDULE AN APPOINTMENT) 09/21/15   Carlena Bjornstad, MD  cephALEXin (KEFLEX) 500 MG capsule Take 1 capsule (500 mg total) by mouth 3 (three) times daily. 11/03/15   Jearld Fenton, NP  Cholecalciferol (VITAMIN D) 1000 UNITS capsule Take 1,000 Units by mouth daily.     Historical Provider, MD  colchicine 0.6 MG tablet Take 0.6 mg by mouth 3 (three) times a week. For gout flare ups    Historical Provider, MD  fish oil-omega-3 fatty acids 1000 MG capsule Take 1 g by mouth daily.     Historical Provider, MD  furosemide (LASIX) 80 MG tablet TAKE 1 TABLET DAILY 10/06/15   Carlena Bjornstad, MD  glucose blood (FREESTYLE LITE) test strip Use as instructed to test blood sugar once daily and as needed.  Diagnosis:  E11.40  Insulin Dependent. 05/22/15   Tonia Ghent, MD  insulin glargine (LANTUS) 100 UNIT/ML injection INJECT 25 UNITS UNDER THE SKIN DAILY AS INSTRUCTED BY DOCTOR 04/28/15   Tonia Ghent, MD  Insulin Syringe-Needle U-100 (INSULIN SYRINGE .3CC/31GX5/16") 31G X 5/16" 0.3 ML MISC Use daily as instructed. 12/26/14   Tonia Ghent, MD  isosorbide mononitrate (IMDUR) 30 MG 24 hr tablet TAKE 2 TABLETS TWICE A DAY 08/24/15   Carlena Bjornstad, MD  magnesium oxide (MAG-OX) 400 MG tablet Take 400 mg by mouth daily.     Historical Provider, MD    Multiple Vitamin (MULTIVITAMIN WITH MINERALS) TABS Take 1 tablet by mouth daily.    Historical Provider, MD  nitroGLYCERIN (NITROSTAT) 0.4 MG SL tablet Place 1 tablet (0.4 mg total) under the tongue every 5 (five) minutes as needed for chest pain. 09/04/15   [name], MD  Potassium Gluconate (K-99) 595 MG CAPS Take 595 mg by mouth every evening.     Historical Provider, MD  ranolazine (RANEXA) 500 MG 12 hr tablet Take 1 tablet (500 mg total) by mouth 2 (two) times daily. 09/28/15   [name], MD  rosuvastatin (CRESTOR) 20 MG tablet Take 1 tablet (20 mg total) by mouth daily. 09/28/15   Carlena Bjornstad, MD  TOPROL XL 50 MG 24 hr tablet TAKE 1 TABLET DAILY WITH A MEAL OR IMMEDIATELY FOLLOWING A MEAL 10/06/15   Carlena Bjornstad, MD  traMADol (ULTRAM) 50 MG tablet Take 1 tablet (50 mg total) by mouth every 6 (six) hours as needed. ARTHRITIS 05/24/15   Tonia Ghent, MD    Scheduled Meds:  Infusions:  PRN Meds:    Allergies as of 11/13/2015  . (No Known Allergies)    Family History  Problem Relation Age of Onset  . Kidney disease Mother     Kidney failure  . Stroke Mother   . Diabetes Mother   . Heart disease Father     MI  . Arthritis Sister   . Cancer Sister     Throat  . Heart disease Sister     MI  . Diabetes Sister   . Prostate cancer Neg Hx   . Colon cancer Neg Hx     Social History   Social History  . Marital Status: Widowed    Spouse Name: N/A  . Number of Children: 2  . Years of Education: N/A   Occupational History  .  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 Smoking status: Former Smoker    Quit date: 11/14/1956  . Smokeless tobacco: Never Used  . Alcohol Use: No  . Drug Use: No  . Sexual Activity: Not on file   Other Topics Concern  . Not on file   Social History Narrative   From [place].  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  Widowed 12/2005.     REVIEW OF SYSTEMS: Constitutional:  Feels well. ENT:  No nose bleeds Pulm:  Stable dyspnea on exertion. No cough. No PND. CV:  No palpitations, no LE edema. No chest pain. GU:  No hematuria, no frequency GI:  Per HPI Heme:  Per HPI.  No unusual tendency to bleed excessively or bruise inordinately.   Transfusions:  Back in 2009 he underwent "17" blood transfusions associated with prolonged diverticular bleeding. Neuro:  No headaches, no peripheral tingling or numbness Derm:  No itching, no rash or sores.  Endocrine:  No sweats or chills.  No polyuria or dysuria Immunization:  Immunizations were reviewed. He is up-to-date on his shot and multiple other vaccinations. Travel:  None beyond local counties in last few months.    PHYSICAL EXAM: Vital signs in last 24 hours: Filed Vitals:   11/13/15 1405 11/13/15 1500  BP: 148/47 123/64  Pulse: 71 66  Temp: 98.2 F (36.8 C)   Resp:  18   Wt Readings from Last 3 Encounters:  11/03/15 86.637 kg (191 lb)  09/02/15 89.359 kg (197 lb)  08/31/15 88.338 kg (194 lb 12 oz)   General: Pleasant, appears decades younger than his actual age. Comfortable. Does not look ill. Head:  No swelling, no asymmetry, no signs of head trauma.  Eyes:  Conjunctiva is slightly pale. EOMI. Ears:  Not HOH.  Nose:  No congestion or discharge. Mouth:  Moist, clear, pink oral mucosa. Fair dentition. Neck:  No masses, no JVD, no TMG. Lungs:  Somewhat diminished breath sounds but clear. No dyspnea. No cough. Heart: RRR. No MRG. S1/S2 audible. Abdomen:  Soft, nondistended. Active bowel sounds minimal tenderness in the right lower quadrant but no guarding or rebound. Bowel sounds are active. No HSM, no hernias, no bruits..   Rectal: Deferred rectal exam.   Musc/Skeltl: No joint contractures and deformities, swelling or redness. Extremities:  No lower extremity edema.  Neurologic:  Patient is fully alert. He is oriented 3. No tremor. No limb weakness. Skin:  No  telangiectasia, rashes or sores. Tattoos:  None Nodes:  No cervical or inguinal adenopathy.   Psych:  Pleasant, calm, cooperative. Normal affect.  Intake/Output from previous day:   Intake/Output this shift:    LAB RESULTS:  Recent Labs  11/13/15 1414  WBC 8.4  HGB 9.2*  HCT 27.6*  PLT 178   BMET Lab Results  Component Value Date   NA 138 11/13/2015   NA 139 12/26/2014   NA 139 07/03/2014   K 4.1 11/13/2015   K 3.9 12/26/2014   K 3.2* 07/03/2014   CL 102 11/13/2015   CL 100 12/26/2014   CL 101 07/03/2014   CO2 26 11/13/2015   CO2 33* 12/26/2014   CO2 30 07/03/2014   GLUCOSE 203* 11/13/2015   GLUCOSE 142* 12/26/2014   GLUCOSE 179* 07/03/2014   BUN 35* 11/13/2015   BUN 31* 12/26/2014   BUN 26* 07/03/2014   CREATININE 2.33* 11/13/2015  CREATININE 2.07* 12/26/2014   CREATININE 2.0* 07/03/2014   CALCIUM 8.9 11/13/2015   CALCIUM 10.4 12/26/2014   CALCIUM 9.3 07/03/2014   LFT No results for input(s): PROT, ALBUMIN, AST, ALT, ALKPHOS, BILITOT, BILIDIR, IBILI in the last 72 hours. PT/INR Lab Results  Component Value Date   INR 1.47 06/15/2014   INR 1.21 06/14/2014   INR 1.12 06/13/2014   Hepatitis Panel No results for input(s): HEPBSAG, HCVAB, HEPAIGM, HEPBIGM in the last 72 hours. C-Diff No components found for: CDIFF Lipase     Component Value Date/Time   LIPASE 29 09/01/2008 2100    Drugs of Abuse  No results found for: LABOPIA, COCAINSCRNUR, LABBENZ, AMPHETMU, THCU, LABBARB   RADIOLOGY STUDIES: No results found.  ENDOSCOPIC STUDIES: Per HPI  IMPRESSION:   *  Painless lower GI bleed. Assume this is recurrent diverticular bleed.  *  Acute on chronic anemia, owing to current acute blood loss.  *  Coronary artery disease. History of CABG. Cardiac stenting in 2015. On chronic low-dose aspirin and 11th. Last dose of Brilinta was morning 11/11/15.   PLAN:     *  Case discussed with Dr. Havery Moros. Plan is for: Prep tonight, allow clear  liquids. Colonoscopy is set for tomorrow morning. Patient agreeable.  Serial hemoglobins.   Azucena Freed  11/13/2015, 3:53 PM Pager: [phone]

## 2015-11-14 ENCOUNTER — Encounter (HOSPITAL_COMMUNITY)
Admission: EM | Disposition: A | Discharge status: Home / Self Care | Payer: Self-pay | Source: Home / Self Care | Attending: Internal Medicine

## 2015-11-14 DIAGNOSIS — K922 Gastrointestinal hemorrhage, unspecified: Secondary | ICD-10-CM

## 2015-11-14 DIAGNOSIS — K5731 Diverticulosis of large intestine without perforation or abscess with bleeding: Principal | ICD-10-CM

## 2015-11-14 DIAGNOSIS — N189 Chronic kidney disease, unspecified: Secondary | ICD-10-CM

## 2015-11-14 DIAGNOSIS — I200 Unstable angina: Secondary | ICD-10-CM

## 2015-11-14 DIAGNOSIS — N179 Acute kidney failure, unspecified: Secondary | ICD-10-CM

## 2015-11-14 DIAGNOSIS — I209 Angina pectoris, unspecified: Secondary | ICD-10-CM

## 2015-11-14 DIAGNOSIS — D62 Acute posthemorrhagic anemia: Secondary | ICD-10-CM

## 2015-11-14 DIAGNOSIS — Z951 Presence of aortocoronary bypass graft: Secondary | ICD-10-CM

## 2015-11-14 DIAGNOSIS — I2510 Atherosclerotic heart disease of native coronary artery without angina pectoris: Secondary | ICD-10-CM

## 2015-11-14 HISTORY — PX: ESOPHAGOGASTRODUODENOSCOPY: SHX5428

## 2015-11-14 HISTORY — PX: COLONOSCOPY: SHX5424

## 2015-11-14 LAB — CBC
HCT: 28.3 % — ABNORMAL LOW (ref 39.0–52.0)
HEMATOCRIT: 27.4 % — AB (ref 39.0–52.0)
HEMATOCRIT: 28.3 % — AB (ref 39.0–52.0)
HEMOGLOBIN: 9.2 g/dL — AB (ref 13.0–17.0)
HEMOGLOBIN: 9.4 g/dL — AB (ref 13.0–17.0)
Hemoglobin: 9.3 g/dL — ABNORMAL LOW (ref 13.0–17.0)
MCH: 30.5 pg (ref 26.0–34.0)
MCH: 29.8 pg (ref 26.0–34.0)
MCH: 30.1 pg (ref 26.0–34.0)
MCHC: 33.6 g/dL (ref 30.0–36.0)
MCHC: 32.9 g/dL (ref 30.0–36.0)
MCHC: 33.2 g/dL (ref 30.0–36.0)
MCV: 90.7 fL (ref 78.0–100.0)
MCV: 90.7 fL (ref 78.0–100.0)
MCV: 90.7 fL (ref 78.0–100.0)
PLATELETS: 160 10*3/uL (ref 150–400)
Platelets: 171 10*3/uL (ref 150–400)
Platelets: 137 10*3/uL — ABNORMAL LOW (ref 150–400)
RBC: 3.02 MIL/uL — ABNORMAL LOW (ref 4.22–5.81)
RBC: 3.12 MIL/uL — AB (ref 4.22–5.81)
RBC: 3.12 MIL/uL — ABNORMAL LOW (ref 4.22–5.81)
RDW: 15.6 % — ABNORMAL HIGH (ref 11.5–15.5)
RDW: 15.8 % — ABNORMAL HIGH (ref 11.5–15.5)
RDW: 15.7 % — AB (ref 11.5–15.5)
WBC: 8 10*3/uL (ref 4.0–10.5)
WBC: 11.3 10*3/uL — AB (ref 4.0–10.5)
WBC: 8.4 10*3/uL (ref 4.0–10.5)

## 2015-11-14 LAB — MRSA PCR SCREENING: MRSA by PCR: NEGATIVE

## 2015-11-14 LAB — URINALYSIS, ROUTINE W REFLEX MICROSCOPIC
Bilirubin Urine: NEGATIVE
GLUCOSE, UA: NEGATIVE mg/dL
Hgb urine dipstick: NEGATIVE
Ketones, ur: NEGATIVE mg/dL
LEUKOCYTES UA: NEGATIVE
Nitrite: NEGATIVE
PH: 5 (ref 5.0–8.0)
Protein, ur: NEGATIVE mg/dL
Specific Gravity, Urine: 1.018 (ref 1.005–1.030)

## 2015-11-14 LAB — TROPONIN I
TROPONIN I: 0.03 ng/mL (ref <0.031)
TROPONIN I: 5.67 ng/mL — AB (ref <0.031)

## 2015-11-14 LAB — BASIC METABOLIC PANEL
Anion gap: 9 (ref 5–15)
BUN: 35 mg/dL — AB (ref 6–20)
CHLORIDE: 106 mmol/L (ref 101–111)
CO2: 28 mmol/L (ref 22–32)
CREATININE: 2.29 mg/dL — AB (ref 0.61–1.24)
Calcium: 8.4 mg/dL — ABNORMAL LOW (ref 8.9–10.3)
GFR calc Af Amer: 29 mL/min — ABNORMAL LOW (ref >60)
GFR calc non Af Amer: 25 mL/min — ABNORMAL LOW (ref >60)
GLUCOSE: 118 mg/dL — AB (ref 65–99)
Potassium: 3.7 mmol/L (ref 3.5–5.1)
Sodium: 143 mmol/L (ref 135–145)

## 2015-11-14 LAB — PREPARE RBC (CROSSMATCH)

## 2015-11-14 LAB — GLUCOSE, CAPILLARY
GLUCOSE-CAPILLARY: 105 mg/dL — AB (ref 65–99)
GLUCOSE-CAPILLARY: 118 mg/dL — AB (ref 65–99)
GLUCOSE-CAPILLARY: 129 mg/dL — AB (ref 65–99)
GLUCOSE-CAPILLARY: 128 mg/dL — AB (ref 65–99)

## 2015-11-14 SURGERY — COLONOSCOPY
Anesthesia: Moderate Sedation

## 2015-11-14 MED ORDER — NITROGLYCERIN 0.4 MG SL SUBL
SUBLINGUAL_TABLET | SUBLINGUAL | Status: AC
  Filled 2015-11-14: qty 1

## 2015-11-14 MED ORDER — EPINEPHRINE HCL 0.1 MG/ML IJ SOSY
PREFILLED_SYRINGE | INTRAMUSCULAR | Status: AC
  Filled 2015-11-14: qty 10

## 2015-11-14 MED ORDER — METOPROLOL TARTRATE 1 MG/ML IV SOLN
5.0000 mg | Freq: Four times a day (QID) | INTRAVENOUS | Status: DC
  Administered 2015-11-15 – 2015-11-20 (×23): 5 mg via INTRAVENOUS
  Filled 2015-11-14 (×23): qty 5

## 2015-11-14 MED ORDER — SODIUM CHLORIDE 0.9 % IV SOLN
Freq: Once | INTRAVENOUS | Status: AC
  Administered 2015-11-15: via INTRAVENOUS

## 2015-11-14 MED ORDER — MIDAZOLAM HCL 5 MG/5ML IJ SOLN
INTRAMUSCULAR | Status: DC | PRN
  Administered 2015-11-14 (×2): 1 mg via INTRAVENOUS
  Administered 2015-11-14: 2 mg via INTRAVENOUS
  Administered 2015-11-14 (×2): 1 mg via INTRAVENOUS

## 2015-11-14 MED ORDER — MIDAZOLAM HCL 5 MG/ML IJ SOLN
INTRAMUSCULAR | Status: AC
  Filled 2015-11-14: qty 2

## 2015-11-14 MED ORDER — BUTAMBEN-TETRACAINE-BENZOCAINE 2-2-14 % EX AERO
INHALATION_SPRAY | CUTANEOUS | Status: DC | PRN
  Administered 2015-11-14: 2 via TOPICAL

## 2015-11-14 MED ORDER — FENTANYL CITRATE (PF) 100 MCG/2ML IJ SOLN
INTRAMUSCULAR | Status: DC | PRN
  Administered 2015-11-14 (×2): 25 ug via INTRAVENOUS

## 2015-11-14 MED ORDER — NITROGLYCERIN 0.4 MG SL SUBL
0.4000 mg | SUBLINGUAL_TABLET | SUBLINGUAL | Status: DC | PRN
  Administered 2015-11-14 (×2): 0.4 mg via SUBLINGUAL

## 2015-11-14 MED ORDER — SODIUM CHLORIDE 0.9 % IV SOLN: Freq: Once | INTRAVENOUS | Status: DC

## 2015-11-14 MED ORDER — DIPHENHYDRAMINE HCL 50 MG/ML IJ SOLN
INTRAMUSCULAR | Status: AC
  Filled 2015-11-14: qty 1

## 2015-11-14 MED ORDER — FENTANYL CITRATE (PF) 100 MCG/2ML IJ SOLN
INTRAMUSCULAR | Status: AC
  Filled 2015-11-14: qty 2

## 2015-11-14 MED ORDER — NITROGLYCERIN IN D5W 200-5 MCG/ML-% IV SOLN: 0.0000 ug/min | INTRAVENOUS | Status: DC

## 2015-11-14 MED ORDER — METOPROLOL TARTRATE 25 MG PO TABS: 25.0000 mg | ORAL_TABLET | Freq: Two times a day (BID) | ORAL | Status: DC

## 2015-11-14 MED ORDER — SODIUM CHLORIDE 0.9 % IJ SOLN
PREFILLED_SYRINGE | INTRAMUSCULAR | Status: DC | PRN
  Administered 2015-11-14: 4.5 mL

## 2015-11-14 MED ORDER — NITROGLYCERIN 0.4 MG SL SUBL
0.4000 mg | SUBLINGUAL_TABLET | SUBLINGUAL | Status: AC
  Administered 2015-11-14: 0.4 mg via SUBLINGUAL

## 2015-11-14 MED ORDER — MORPHINE SULFATE (PF) 2 MG/ML IV SOLN
1.0000 mg | INTRAVENOUS | Status: DC | PRN
  Administered 2015-11-14 – 2015-11-15 (×2): 1 mg via INTRAVENOUS
  Filled 2015-11-14 (×2): qty 1

## 2015-11-14 NOTE — Progress Notes (Signed)
PROGRESS NOTE  [name] [id number] DOB: [birth year] DOA: 11/13/2015 PCP: [name], MD  Assessment/Plan: Diverticular GI bleed -s/p EGD (gastritis) -colonoscopy- bleeding diverticula - continue NPO as may re-bleed- would need IR consult at that time  History of coronary artery disease.  -status post coronary artery bypass grafting in 1996 and 2006.  -percutaneous intervention in July 2015 to vein graft and subsequently placed on aspirin and Brilinta. He has been deemed Plavix nonresponder. Since he presents with GI bleed and symptomatic anemia will discontinue dual agent antiplatelet therapy for now.  -cardiology consult for CP after procedure- given nitro with out relief, morphine gave relief  Symptomatic anemia. -given 1 unit of PRBC  History of diastolic congestive heart failure. Last transthoracic echocardiogram performed 06/14/2014 that revealed ejection fraction of 60-65% with grade 2 diastolic dysfunction. He had been on Lasix therapy at home which will be held as I suspect he is on depleted from GI bleed. Volume status will be monitored closely particularly as he'll be transfused one unit of packed red blood cells.  Acute on chronic renal failure.  -monitor  Diabetes mellitus.  - Lantus will be decreased to 10 units subcutaneous daily while NPO. - Will cover with sliding-scale insulin every 4 hours.  Hypertension.  -hold antihypertensive agents. Will monitor blood pressures overnight.  Code Status: full Family Communication: patient/daughter at bedside Disposition Plan:    Consultants:  GI  cards  Procedures:      HPI/Subjective: C/o 10/10 chest pain  Objective: Filed Vitals:   11/14/15 1050 11/14/15 1107  BP: 129/57 133/54  Pulse: 79 76  Temp:  97.9 F (36.6 C)  Resp: 23 16    Intake/Output Summary (Last 24 hours) at 11/14/15 1346 Last data filed at 11/14/15 0839  Gross per 24 hour  Intake   2975 ml  Output      0 ml  Net   2975  ml   Filed Weights   11/13/15 1650 11/14/15 0420  Weight: 86.5 kg (190 lb 11.2 oz) 85.594 kg (188 lb 11.2 oz)    Exam:   General:  Pale, chronically ill appearing  Cardiovascular: rrr  Respiratory: diminished  Abdomen: +BS, soft  Musculoskeletal: no edema   Data Reviewed: Basic Metabolic Panel:  Recent Labs Lab 11/13/15 1414 11/14/15 0256  NA 138 143  K 4.1 3.7  CL 102 106  CO2 26 28  GLUCOSE 203* 118*  BUN 35* 35*  CREATININE 2.33* 2.29*  CALCIUM 8.9 8.4*   Liver Function Tests:  Recent Labs Lab 11/13/15 1704  AST 31  ALT 27  ALKPHOS 65  BILITOT 0.6  PROT 6.9  ALBUMIN 3.5   No results for input(s): LIPASE, AMYLASE in the last 168 hours. No results for input(s): AMMONIA in the last 168 hours. CBC:  Recent Labs Lab 11/13/15 1414 11/13/15 1704 11/14/15 0256 11/14/15 1248  WBC 8.4 10.2 8.0 11.3*  NEUTROABS 6.1  --   --   --   HGB 9.2* 9.7* 9.2* 9.3*  HCT 27.6* 28.6* 27.4* 28.3*  MCV 91.7 92.0 90.7 90.7  PLT 178 203 171 160   Cardiac Enzymes:  Recent Labs Lab 11/14/15 1248  TROPONINI 0.03   BNP (last 3 results) No results for input(s): BNP in the last 8760 hours.  ProBNP (last 3 results) No results for input(s): PROBNP in the last 8760 hours.  CBG:  Recent Labs Lab 11/13/15 2017 11/13/15 2355 11/14/15 0424 11/14/15 0802 11/14/15 1139  GLUCAP 120* 150* 105* 118* 129*  No results found for this or any previous visit (from the past 240 hour(s)).   Studies: No results found.  Scheduled Meds: . sodium chloride   Intravenous Once  . insulin aspart  0-15 Units Subcutaneous 6 times per day  . insulin glargine  10 Units Subcutaneous QHS  . pantoprazole (PROTONIX) IV  40 mg Intravenous Q24H  . ranolazine  500 mg Oral BID  . sodium chloride  3 mL Intravenous Q12H   Continuous Infusions: . sodium chloride 60 mL/hr at 11/13/15 2038   Antibiotics Given (last 72 hours)    None      Principal Problem:   GI bleed Active  Problems:   Type 2 diabetes mellitus with peripheral neuropathy ([name])   CAD (coronary artery disease)   Hx of CABG   History of gastrointestinal bleeding   Acute-on-chronic kidney injury ([place])   Symptomatic anemia   Acute GI bleeding   Diverticulosis of colon with hemorrhage    Time spent: 35 min    [facility] Pager [phone]. If 7PM-7AM, please contact night-coverage at [email or web address], password [facility] 11/14/2015, 1:46 PM  LOS: 1 day

## 2015-11-14 NOTE — Progress Notes (Signed)
Complain of chest pain after colonoscopy Dr  Enis Gash

## 2015-11-14 NOTE — Progress Notes (Signed)
Cardiology MD called to verify RBC administration. New order for CBC obtained. CBC to be taken after first unit per verbal order. Will continue to monitor.

## 2015-11-14 NOTE — Progress Notes (Signed)
Patient back into 2W32 from endo. On arrival patient is alert and oriented, complained of chest pains after the procedure and nitro 0.4 was administered. Patient stable now with stable vital signs. See flowsheet.

## 2015-11-14 NOTE — Consult Note (Addendum)
CARDIOLOGY CONSULT NOTE   Patient ID: [name] MRN: [id number], DOB/AGE: 79-30-1933   Admit date: 11/13/2015 Date of Consult: 11/14/2015  Primary Physician: [name], MD Primary Cardiologist: Dr Gwenlyn Found  Reason for consult: GIB, chest pain  Problem List  Past Medical History  Diagnosis Date  . Anemia     Secondary to acute blood loss  . Hypertension   . Hyperlipidemia   . RBBB (right bundle branch block)   . Age-related macular degeneration, wet, both eyes ([place])   . Carotid artery disease ([place])     Doppler 09/18/2009 - 49% bilateral stenoses  . Personal history of colonic polyps   . Diverticulosis of colon with hemorrhage 2009    several unit diverticular bleed   . Erectile dysfunction     Mild  . Atrial flutter [facility]) 07/2010    September, 2011   Hospital with PNA and cath done.Marland KitchenMarland KitchenCoumadin.  Atrial flutter ablation planned, but  pt. then had atrial fibrillation,/outpatient conversion 09/08/10..NSR..plan to follow..Dr. Caryl Comes  . Gout   . Skin cancer     R lower leg, per derm 2012  . Atrial fibrillation ([place])     Consideration was given for atrial flutter ablation, but patient developed atrial fibrillation. Cardioversion was done. Dr. Caryl Comes decided to watch him clinically. November, 2011  . Carotid artery disease ([name])     49% bilateral, Doppler, November, 2010  . CAD (coronary artery disease)     Catheterization, September, 2011,  grafts patent from redo CABG,, medical therapy of coronary disease, consideration to proceeding with atrial flutter ablation  . Ejection fraction     EF 60%, echo, 2009  //   EF 65%, echo, September, 2011  . Mitral regurgitation     Mild, echo, September, 2011  . Shoulder pain     "positional; better now" (11/13/2015)  . Anginal pain ([place])   . PNA (pneumonia) 9/11    NSTEMI at [facility] with repeat cath, rec medical mgmt   . NSTEMI (non-ST elevated myocardial infarction) ([place]) 07/2010    at [facility] with repeat cath, rec medical mgmt   . OSA on  CPAP   . Type II diabetes mellitus ([place])   . Diabetic peripheral neuropathy ([place])   . History of blood transfusion "several"    "related to diverticular bleeding"  . Arthritis     "mild in hands, knees, ankles" (11/13/2015)  . Chronic kidney disease (CKD), stage III (moderate)     Past Surgical History  Procedure Laterality Date  . Cardiac catheterization  2006    Nuclear..slight lateral ischemia..medical therapy  . Cardiac catheterization  08/04/2010    grafts patent from redo CABG...medical Rx and ablate Atrial flutter (LV not injected)   . Doppler echocardiography  08/2008    EF 60%  . Doppler echocardiography  08/02/2010    65-70%  . Doppler echocardiography  07/2010    MR mild  . Coronary artery bypass graft  1995; 2006    "X 3; X3"  . Colonoscopy w/ polypectomy    . Cataract extraction w/ intraocular lens  implant, bilateral Bilateral   . Vasectomy    . Tonsillectomy    . Carotid endarterectomy Right 1994  . Coronary artery bypass graft  1995, 2006  . Esophagogastroduodenoscopy N/A 06/19/2014    Procedure: ESOPHAGOGASTRODUODENOSCOPY (EGD);  Surgeon: Jerene Bears, MD;  Location[facility] ENDOSCOPY;  Service: Endoscopy;  Laterality: N/A;  . Left heart catheterization with coronary/graft angiogram N/A 06/11/2014    Procedure: LEFT HEART  CATHETERIZATION WITH Beatrix Fetters;  Surgeon: Sinclair Grooms, MD;  Location: [facility] CATH LAB;  Service: Cardiovascular;  Laterality: N/A;  . Percutaneous coronary stent intervention (pci-s) N/A 06/13/2014    Procedure: PERCUTANEOUS CORONARY STENT INTERVENTION (PCI-S);  Surgeon: Sinclair Grooms, MD;  Location: [facility] CATH LAB;  Service: Cardiovascular;  Laterality: N/A;  . Left heart cath N/A 06/14/2014    Procedure: LEFT HEART CATH;  Surgeon: Sinclair Grooms, MD;  Location: [facility] CATH LAB;  Service: Cardiovascular;  Laterality: N/A;  . Laparoscopic cholecystectomy  2008  . Skin cancer excision Left 10/2015    calf  . Skin cancer excision Right 2014?     chest  . Cardioversion  ~ 2010    Allergies  No Known Allergies   HPI   81 y.o. male with a PMH of CAD, s/p CABG and PCI in July 2015 with stenting to high grade OM branch of vein graft. Patient was felt to be a Plavix nonresponder for which she was treated with Brilinta in addition to aspirin therapy. He also has a history of paroxysmal atrial fibrillation and previously had been anticoagulated with Coumadin which was discontinued due to GI bleed. She reports hospitalized about 5 years ago for GI bleed undergoing upper and lower endoscopy. At the time he required blood transfusions. It was felt that GI bleed represented diverticular bleed. He presents to the emergency department today with complaints of bright red blood per rectum associate with dark colored stools. He also complains of generalized abdominal pain. Symptoms starting 2 days ago. He reports associated chest tightness, poor tolerance to physical exertion, palpitations and shortness of breath. He is currently on Brilinta and aspirin therapy. Denies taking anticoagulants, NSAIDS, denies hematemesis. Lab work in the emergency department showing hemoglobin of 9.2. GI was consulted.  Inpatient Medications  . sodium chloride   Intravenous Once  . insulin aspart  0-15 Units Subcutaneous 6 times per day  . insulin glargine  10 Units Subcutaneous QHS  . pantoprazole (PROTONIX) IV  40 mg Intravenous Q24H  . ranolazine  500 mg Oral BID  . sodium chloride  3 mL Intravenous Q12H   Family History  Family History  Problem Relation Age of Onset  . Kidney disease Mother     Kidney failure  . Stroke Mother   . Diabetes Mother   . Heart disease Father     MI  . Arthritis Sister   . Cancer Sister     Throat  . Heart disease Sister     MI  . Diabetes Sister   . Prostate cancer Neg Hx   . Colon cancer Neg Hx      Social History Social History   Social History  . Marital Status: Widowed    Spouse Name: N/A  . Number of  Children: 2  . Years of Education: N/A   Occupational History  . 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 Smoking status: Former Smoker -- 1.00 packs/day for 8 years    Types: Cigarettes  . Smokeless tobacco: Never Used     Comment: "quit smoking cigarettes in 1958"  . Alcohol Use: No  . Drug Use: No  . Sexual Activity: Yes   Other Topics Concern  . Not on file   Social History Narrative   From [place].  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  Widowed  12/2005.     Review of Systems  General:  No chills, fever, night sweats or weight changes.  Cardiovascular:  No chest pain, dyspnea on exertion, edema, orthopnea, palpitations, paroxysmal nocturnal dyspnea. Dermatological: No rash, lesions/masses Respiratory: No cough, dyspnea Urologic: No hematuria, dysuria Abdominal:   No nausea, vomiting, diarrhea, bright red blood per rectum, melena, or hematemesis Neurologic:  No visual changes, wkns, changes in mental status. All other systems reviewed and are otherwise negative except as noted above.  Physical Exam  Blood pressure 135/48, pulse 83, temperature 98 F (36.7 C), temperature source Oral, resp. rate 15, height 5\' 9"  (1.753 m), weight 188 lb 11.2 oz (85.594 kg), SpO2 99 %.  General: Pleasant, NAD Psych: Normal affect. Neuro: Alert and oriented X 3. Moves all extremities spontaneously. HEENT: Normal  Neck: Supple without bruits or JVD. Lungs:  Resp regular and unlabored, CTA. Heart: RRR no s3, s4, or murmurs. Abdomen: Soft, non-tender, non-distended, BS + x 4.  Extremities: No clubbing, cyanosis or edema. DP/PT/Radials 2+ and equal bilaterally.  Labs   Recent Labs  11/14/15 1248  TROPONINI 0.03   Lab Results  Component Value Date   WBC 11.3* 11/14/2015   HGB 9.3* 11/14/2015   HCT 28.3* 11/14/2015   MCV 90.7 11/14/2015   PLT 160 11/14/2015    Recent Labs Lab  11/13/15 1704 11/14/15 0256  NA  --  143  K  --  3.7  CL  --  106  CO2  --  28  BUN  --  35*  CREATININE  --  2.29*  CALCIUM  --  8.4*  PROT 6.9  --   BILITOT 0.6  --   ALKPHOS 65  --   ALT 27  --   AST 31  --   GLUCOSE  --  118*   Lab Results  Component Value Date   CHOL 102 09/01/2014   HDL 28.90* 09/01/2014   LDLCALC 56 09/01/2014   TRIG 86.0 09/01/2014   Lab Results  Component Value Date   DDIMER  08/01/2010    0.29        AT THE INHOUSE ESTABLISHED CUTOFF VALUE OF 0.48 ug/mL FEU, THIS ASSAY HAS BEEN DOCUMENTED IN THE LITERATURE TO HAVE A SENSITIVITY AND NEGATIVE PREDICTIVE VALUE OF AT LEAST 98 TO 99%.  THE TEST RESULT SHOULD BE CORRELATED WITH AN ASSESSMENT OF THE CLINICAL PROBABILITY OF DVT / VTE.   Echocardiogram:   ECG: SR, bifascicular block, new STD in the lateral leads consistent with lateral ischemia    ASSESSMENT AND PLAN  1. GIB - Hb 9.2, in October 2016 10.9, GI consulted, switched Brilinta to Plavix with new chest pain post EGD this am, and new ECG changes. EGD showed gistritis, Colonoscopy showed clots, he is actively bleeding diverticulum in the left colon, suspect in the descending colon, treated with hemostasis clips and epinephrine injection for hemostasis. No active bleeding noted at the end of the procedure, large hemorrhoids.  2. Chest pain - active 10/10 right now with new STD in the lateral leads consistent with lateral ischemia, negative troponin x 1,  He is not a candidate for cath given inability to anticoagulate, active bleeding and CKD stage 4.  The best plan would be:  - transfuse 2 units of blood to keep HB > 10, we will monitor for fluid overload - start NTG drip - start morphine iv PRN - start O2 by nasal canula - start metoprolol 5 mg iv Q6H - continue ranolazine  3. CAD, History of coronary artery disease status post bypass grafting in 1995 with 3 grafts placed at that time and redo bypass in 2006 with again 3 grafts  placed by [name]. He was catheterized by [name] in July 2015 and had a stent placed to the circumflex vein graft.  - now CP post discontinuation of Brilinta, he is Plavix non-responder, we wont start, restart Brilinta as soon as acceptable from GI standpoint, not yet as he is actively bleeding  3. PAF - history of paroxysmal atrial fibrillation on Coumadin anticoagulation in the past which was discontinued because of GI bleed and the need to be on dual antiplatelet therapy. He has maintained sinus rhythm.  4. HLP - restart atorvastatin once able to take PO meds  Signed, Dorothy Spark, MD, [facility] 11/14/2015, 2:05 PM

## 2015-11-14 NOTE — Op Note (Signed)
[facility] [place], [address]   COLONOSCOPY PROCEDURE REPORT  PATIENT: [name], [name]  MR#: [id number] BIRTHDATE: 1931/12/20 , 55  yrs. old GENDER: male ENDOSCOPIST: Yetta Flock, MD REFERRED BY: PROCEDURE DATE:  11/14/2015 PROCEDURE:   Colonoscopy, diagnostic, Colonoscopy with control of bleeding, and Submucosal injection, any substance  ASA CLASS:   Class III INDICATIONS:Evaluation of unexplained GI bleeding and Colorectal Neoplasm Risk Assessment for this procedure is average risk. MEDICATIONS: Versed 3 mg IV  DESCRIPTION OF PROCEDURE:   After the risks benefits and alternatives of the procedure were thoroughly explained, informed consent was obtained.  The digital rectal exam revealed external hemorrhoids.   The Pentax Ped Colon [id number]  endoscope was introduced through the anus and advanced to the cecum, which was identified by the ileocecal valve. No adverse events experienced. The quality of the prep was poor.  The instrument was then slowly withdrawn as the colon was fully examined. Estimated blood loss is zero unless otherwise noted in this procedure report.   COLON FINDINGS: The bowel preparation was poor.  Solid stool was noted intermittently throughout the colon, and maroon / red blood with significant clot burden.  The blood and clots were noted through the left colon and distal transverse colon.  The proximal transverse colon and right colon had stool without blood.  The IC valve was reached but signficant stool burden in the cecum and the AO was not identified.  Significant time was spent lavaging the colon and clearing the stool and blood clots, and in the descending colon there was an actively bleeding diverticulum noted behind a fold.  Two hemostasis clips were applied in the area, however visualization was poor from the stool burden and blood, unclear if the entire diverticulum was closed with these clips  or not.  A 74ml of epinephrine was injected into the area of the colon with good blanching.  After endoscopic intervention as outlined, there appeared to be hemostasis at the time.  Retroflexed views revealed internal hemorrhoids. The time to cecum =    Withdrawal time = The scope was withdrawn and the procedure completed.  COMPLICATIONS: There were no immediate complications during the procedure. Following the procedure as the patient awoke, he reported some mild anginal chest discomfort (he reports having this from time to time). His HR and BP were stable throughout the procedure. He was given a dose of sublingual nitroglycerin which improved his discomfort and the medicine service was contacted.  ENDOSCOPIC IMPRESSION: Poor prep as outlined above with significant stool burden and clots Actively bleeding diverticulum in the left colon, suspect inthe descending colon, treated with hemostasis clips and epinephrine injection for hemostasis. No active bleeding noted at the end of the procedure Large hemorrhoids  RECOMMENDATIONS: Return to medical ward Patient to have EKG, repeat CBC, and troponins sent now, discussed with primary service Serial Hgb, monitor for recurrence of bleeding, transfuse PRBC as needed If patient has evidence of recurrent bleeding despite endoscopic therapy, recommend IR consult for possible embolization Keep NPO for now while monitoring for recurrent bleeding IV protonix 40mg  q day for gastritis noted on EGD GI service will continue to follow  eSigned:  Yetta Flock, MD 11/14/2015 11:08 AM   cc: the patient   PATIENT NAME:  [name], [name] MR#: [id number]

## 2015-11-14 NOTE — Progress Notes (Signed)
Patient being transferred to SDU for continued CP-- cards consulted. 2 units PRBC Eulogio Bear DO

## 2015-11-14 NOTE — Progress Notes (Signed)
Hemoglobin 9.4 on follow up CBC post 1 unit PRBC.  Troponin level 5.67. Paged NP Rogue Bussing to inform of hemoglobin and Troponin value.   NP Rogue Bussing advised to transfuse another unit of PRBC and to inform cardiology of Troponin value  Paged cardiologist MD Claiborne Billings to inform of Troponin 5.67.  MD Claiborne Billings returned page at [phone] and is aware of Troponin level. No new orders received.  Vicie Mutters, RN

## 2015-11-14 NOTE — Progress Notes (Signed)
Resting quietly no complaints fo chest pain

## 2015-11-14 NOTE — Interval H&P Note (Signed)
History and Physical Interval Note:  11/14/2015 9:06 AM  [name]  has presented today for surgery, with the diagnosis of Hematochezia.  The various methods of treatment have been discussed with the patient and family. After consideration of risks, benefits and other options for treatment, the patient has consented to  Procedure(s): COLONOSCOPY (N/A) ESOPHAGOGASTRODUODENOSCOPY (EGD) (N/A) as a surgical intervention .  The patient's history has been reviewed, patient examined, no change in status, stable for surgery.  I have reviewed the patient's chart and labs.  Questions were answered to the patient's satisfaction.     Renelda Loma Armbruster

## 2015-11-14 NOTE — Op Note (Signed)
[facility] [place], [address]   ENDOSCOPY PROCEDURE REPORT  PATIENT: Jeremiah, [name]  MR#: [id number] BIRTHDATE: 07-09-1932 , 66  yrs. old GENDER: male ENDOSCOPIST: Yetta Flock, MD REFERRED BY: PROCEDURE DATE:  11/14/2015 PROCEDURE:  EGD, diagnostic ASA CLASS:     Class III INDICATIONS:  acute post hemorrhagic anemia and hematochezia. MEDICATIONS: Fentanyl 50 mcg IV and Versed 3 mg IV TOPICAL ANESTHETIC:  DESCRIPTION OF PROCEDURE: After the risks benefits and alternatives of the procedure were thoroughly explained, informed consent was obtained.  The Pentax Gastroscope [id number] endoscope was introduced through the mouth and advanced to the second portion of the duodenum , Without limitations.  The instrument was slowly withdrawn as the mucosa was fully examined.   FINDINGS: The esophagus was normal.  DH, GEJ, and SCJ located 38cm from the incisors.  The stomach had evidence of a small amount of old heme in the gastric body and antrum, with evidence of some patchy gastritis.  The mucosa was friable in some areas with contact hemorrhage / oozing.  No focal ulcerations or erosions were noted.  The duodenal bulb and 2nd portion of the duodenum were normal.  Retroflexed views revealed no abnormalities.     The scope was then withdrawn from the patient and the procedure completed.  COMPLICATIONS: There were no immediate complications.  ENDOSCOPIC IMPRESSION: Normal esophagus Gastritis with evidence of old heme and friable mucosa in areas, without active bleeding or focal ulceration Normal duodenum     RECOMMENDATIONS: Continue IV protonix while NPO, then transition to protonix 40mg  BID for 2 weeks once tolerating PO, and then daily thereafter while on Brilinta Consider H pylori IgG serology if he has not been evaluated for H pylori in the past Further recommendations per colonoscopy report    eSigned:  Yetta Flock, MD 11/14/2015 11:14 AM    CC: the patient  PATIENT NAME:  [name], [name] MR#: [id number]

## 2015-11-14 NOTE — Progress Notes (Signed)
Lantus held per conversation with NP Rogue Bussing due to patient is currently NPO and blood sugars have been 110-120's. Vicie Mutters, RN

## 2015-11-14 NOTE — H&P (View-Only) (Signed)
[facility] Consult: 3:53 PM 11/13/2015     Referring Provider: Emergency department M.D., [place] Primary Care Physician:  [name], MD Primary Gastroenterologist:  Dr. Hilarie Fredrickson, prior to that he was a patient of Dr. Casper Harrison.    Reason for Consultation:  Hematochezia.   HPI: [name] is a 81 y.o. male.  Type 2 DM.  CKD.  On Dilantin and aspirin for atrial fibrillation.  CAD. Status post CABG in 1995 and 2006.  Cardiac stenting in 05/2014.  Anemia. History of lower GI, diverticular bleeding which dates back to the late 1980s. He thinks he's had about 4 episodes which required hospitalization during his lifetime.  During the 08/2008 diverticular bleed, there was a stuttering pattern over a period of 2-3 weeks and he ended up getting in excess of 15 units of packed cells transfused.  Nuclear tagged red cell scan then was negative   06/2014 EGD for anemia and heme positive stools. Study was normal. 2006 and 2009 colonoscopy revealed hemorrhoids and diverticulosis. 2009 colonoscopy occurred when he was having a painless lower GI bleed and showed pandiverticulosis with ongoing bleeding, fresh blood in the colon. Dr. Carlean Purl was unable to isolate the locus of the bleeding. He had grade 2 internal hemorrhoids.  At 10 PM on 11/11/2015 the patient had acute, painless hematochezia. Before that, in the last few days he had felt like he was straining a bit more to have a bowel movement but was having brown, formed stools. Since the initial bout of hematochezia, he had 4 episodes yesterday, all of which revealed the commode water with red blood. Note is made that the patient has color blindness and sees red as the color black. He has only had one of these stools today,a couple of hours ago, the volume  of this was significantly decreased compared with yesterday. Although he has some tenderness in the abdomen with exam, he does not have abdominal pain. No nausea. No dizziness. No new dyspnea. Once the bleeding started, he stopped the Brilinta so last dose was in the morning of 12/28. He did take his low-dose aspirin yesterday.  In the ED his hemoglobin is 9.2 with MCV in the 90s. In October 2015 his hemoglobin was 10.9. Platelets are okay. Chemistries reveal some acute kidney injury with BUN 35, creatinine 2.3.   Interestingly the patient says he has more minor incidences of hematochezia once or twice a year that last 24-36 hours and subside on their own. He is stoic about things and stays home when these occur. Since this current bleeding bleeding didn't stop in a timely manner, he sought medical attention in the ED.   Past Medical History  Diagnosis Date  . GERD (gastroesophageal reflux disease)   . Anemia     Secondary to acute blood loss  . Hypertension   . Hyperlipidemia   . Diabetes mellitus     Type II  . Diabetic peripheral neuropathy ([place])   . RBBB (right bundle branch block)   . Macular degeneration   . Sleep apnea   .  Carotid artery disease ([place])     Doppler 09/18/2009 - 49% bilateral stenoses  . Personal history of colonic polyps   . Diverticulosis of colon with hemorrhage 2009    several unit diverticular bleed   . Erectile dysfunction     Mild  . PNA (pneumonia) 9/11    NSTEMI at [facility] with repeat cath, rec medical mgmt   . Atrial flutter [facility]) 07/2010    September, 2011   Hospital with PNA and cath done.Marland KitchenMarland KitchenCoumadin.  Atrial flutter ablation planned, but  pt. then had atrial fibrillation,/outpatient conversion 09/08/10..NSR..plan to follow..Dr. Caryl Comes  . Chronic kidney disease     ckd #3  . Gout   . Skin cancer     R lower leg, per derm 2012  . Atrial fibrillation ([place])     Consideration was given for atrial flutter ablation, but patient developed atrial fibrillation.  Cardioversion was done. Dr. Caryl Comes decided to watch him clinically. November, 2011  . Carotid artery disease ([name])     49% bilateral, Doppler, November, 2010  . CAD (coronary artery disease)     Catheterization, September, 2011,  grafts patent from redo CABG,, medical therapy of coronary disease, consideration to proceeding with atrial flutter ablation  . Ejection fraction     EF 60%, echo, 2009  //   EF 65%, echo, September, 2011  . Mitral regurgitation     Mild, echo, September, 2011  . Shoulder pain     Decreasing Crestor did not affect the pain    Past Surgical History  Procedure Laterality Date  . Cardiac catheterization  2006    Nuclear..slight lateral ischemia..medical therapy  . Cardiac catheterization  08/04/2010    grafts patent from redo CABG...medical Rx and ablate Atrial flutter (LV not injected)   . Doppler echocardiography  08/2008    EF 60%  . Doppler echocardiography  08/02/2010    65-70%  . Doppler echocardiography  07/2010    MR mild  . Coronary artery bypass graft  1995     Redo  CABG 2006  . Polypectomy    . Cataract extraction    . Vasectomy    . Tonsillectomy    . Carotid endarterectomy    . Coronary artery bypass graft  1995, 2006  . Cholecystectomy  2008  . Esophagogastroduodenoscopy N/A 06/19/2014    Procedure: ESOPHAGOGASTRODUODENOSCOPY (EGD);  Surgeon: Jerene Bears, MD;  Location: [facility] ENDOSCOPY;  Service: Endoscopy;  Laterality: N/A;  . Left heart catheterization with coronary/graft angiogram N/A 06/11/2014    Procedure: LEFT HEART CATHETERIZATION WITH Beatrix Fetters;  Surgeon: Sinclair Grooms, MD;  Location: [facility] CATH LAB;  Service: Cardiovascular;  Laterality: N/A;  . Percutaneous coronary stent intervention (pci-s) N/A 06/13/2014    Procedure: PERCUTANEOUS CORONARY STENT INTERVENTION (PCI-S);  Surgeon: Sinclair Grooms, MD;  Location: [facility] CATH LAB;  Service: Cardiovascular;  Laterality: N/A;  . Left heart cath N/A 06/14/2014    Procedure: LEFT  HEART CATH;  Surgeon: Sinclair Grooms, MD;  Location: [facility] CATH LAB;  Service: Cardiovascular;  Laterality: N/A;    Prior to Admission medications   Medication Sig Start Date End Date Taking? Authorizing Provider  amLODipine (NORVASC) 10 MG tablet Take 1 tablet (10 mg total) by mouth every evening. 10/01/15   [name], MD  aspirin EC 81 MG EC tablet Take 1 tablet (81 mg total) by mouth daily. 06/19/14   Almyra Deforest, PA  Blood Glucose Monitoring Suppl (FREESTYLE LITE) DEVI Use to  check blood sugar once daily and as needed.  Diagnosis:  E11.40  Insulin dependent. 05/22/15   Tonia Ghent, MD  BRILINTA 90 MG TABS tablet TAKE 1 TABLET TWICE A DAY (PLEASE CALL AND SCHEDULE AN APPOINTMENT) 09/21/15   Carlena Bjornstad, MD  cephALEXin (KEFLEX) 500 MG capsule Take 1 capsule (500 mg total) by mouth 3 (three) times daily. 11/03/15   Jearld Fenton, NP  Cholecalciferol (VITAMIN D) 1000 UNITS capsule Take 1,000 Units by mouth daily.     Historical Provider, MD  colchicine 0.6 MG tablet Take 0.6 mg by mouth 3 (three) times a week. For gout flare ups    Historical Provider, MD  fish oil-omega-3 fatty acids 1000 MG capsule Take 1 g by mouth daily.     Historical Provider, MD  furosemide (LASIX) 80 MG tablet TAKE 1 TABLET DAILY 10/06/15   Carlena Bjornstad, MD  glucose blood (FREESTYLE LITE) test strip Use as instructed to test blood sugar once daily and as needed.  Diagnosis:  E11.40  Insulin Dependent. 05/22/15   Tonia Ghent, MD  insulin glargine (LANTUS) 100 UNIT/ML injection INJECT 25 UNITS UNDER THE SKIN DAILY AS INSTRUCTED BY DOCTOR 04/28/15   Tonia Ghent, MD  Insulin Syringe-Needle U-100 (INSULIN SYRINGE .3CC/31GX5/16") 31G X 5/16" 0.3 ML MISC Use daily as instructed. 12/26/14   Tonia Ghent, MD  isosorbide mononitrate (IMDUR) 30 MG 24 hr tablet TAKE 2 TABLETS TWICE A DAY 08/24/15   Carlena Bjornstad, MD  magnesium oxide (MAG-OX) 400 MG tablet Take 400 mg by mouth daily.     Historical Provider, MD    Multiple Vitamin (MULTIVITAMIN WITH MINERALS) TABS Take 1 tablet by mouth daily.    Historical Provider, MD  nitroGLYCERIN (NITROSTAT) 0.4 MG SL tablet Place 1 tablet (0.4 mg total) under the tongue every 5 (five) minutes as needed for chest pain. 09/04/15   [name], MD  Potassium Gluconate (K-99) 595 MG CAPS Take 595 mg by mouth every evening.     Historical Provider, MD  ranolazine (RANEXA) 500 MG 12 hr tablet Take 1 tablet (500 mg total) by mouth 2 (two) times daily. 09/28/15   [name], MD  rosuvastatin (CRESTOR) 20 MG tablet Take 1 tablet (20 mg total) by mouth daily. 09/28/15   Carlena Bjornstad, MD  TOPROL XL 50 MG 24 hr tablet TAKE 1 TABLET DAILY WITH A MEAL OR IMMEDIATELY FOLLOWING A MEAL 10/06/15   Carlena Bjornstad, MD  traMADol (ULTRAM) 50 MG tablet Take 1 tablet (50 mg total) by mouth every 6 (six) hours as needed. ARTHRITIS 05/24/15   Tonia Ghent, MD    Scheduled Meds:  Infusions:  PRN Meds:    Allergies as of 11/13/2015  . (No Known Allergies)    Family History  Problem Relation Age of Onset  . Kidney disease Mother     Kidney failure  . Stroke Mother   . Diabetes Mother   . Heart disease Father     MI  . Arthritis Sister   . Cancer Sister     Throat  . Heart disease Sister     MI  . Diabetes Sister   . Prostate cancer Neg Hx   . Colon cancer Neg Hx     Social History   Social History  . Marital Status: Widowed    Spouse Name: N/A  . Number of Children: 2  . Years of Education: N/A   Occupational History  .  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 Smoking status: Former Smoker    Quit date: 11/14/1956  . Smokeless tobacco: Never Used  . Alcohol Use: No  . Drug Use: No  . Sexual Activity: Not on file   Other Topics Concern  . Not on file   Social History Narrative   From [place].  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  Widowed 12/2005.     REVIEW OF SYSTEMS: Constitutional:  Feels well. ENT:  No nose bleeds Pulm:  Stable dyspnea on exertion. No cough. No PND. CV:  No palpitations, no LE edema. No chest pain. GU:  No hematuria, no frequency GI:  Per HPI Heme:  Per HPI.  No unusual tendency to bleed excessively or bruise inordinately.   Transfusions:  Back in 2009 he underwent "17" blood transfusions associated with prolonged diverticular bleeding. Neuro:  No headaches, no peripheral tingling or numbness Derm:  No itching, no rash or sores.  Endocrine:  No sweats or chills.  No polyuria or dysuria Immunization:  Immunizations were reviewed. He is up-to-date on his shot and multiple other vaccinations. Travel:  None beyond local counties in last few months.    PHYSICAL EXAM: Vital signs in last 24 hours: Filed Vitals:   11/13/15 1405 11/13/15 1500  BP: 148/47 123/64  Pulse: 71 66  Temp: 98.2 F (36.8 C)   Resp:  18   Wt Readings from Last 3 Encounters:  11/03/15 86.637 kg (191 lb)  09/02/15 89.359 kg (197 lb)  08/31/15 88.338 kg (194 lb 12 oz)   General: Pleasant, appears decades younger than his actual age. Comfortable. Does not look ill. Head:  No swelling, no asymmetry, no signs of head trauma.  Eyes:  Conjunctiva is slightly pale. EOMI. Ears:  Not HOH.  Nose:  No congestion or discharge. Mouth:  Moist, clear, pink oral mucosa. Fair dentition. Neck:  No masses, no JVD, no TMG. Lungs:  Somewhat diminished breath sounds but clear. No dyspnea. No cough. Heart: RRR. No MRG. S1/S2 audible. Abdomen:  Soft, nondistended. Active bowel sounds minimal tenderness in the right lower quadrant but no guarding or rebound. Bowel sounds are active. No HSM, no hernias, no bruits..   Rectal: Deferred rectal exam.   Musc/Skeltl: No joint contractures and deformities, swelling or redness. Extremities:  No lower extremity edema.  Neurologic:  Patient is fully alert. He is oriented 3. No tremor. No limb weakness. Skin:  No  telangiectasia, rashes or sores. Tattoos:  None Nodes:  No cervical or inguinal adenopathy.   Psych:  Pleasant, calm, cooperative. Normal affect.  Intake/Output from previous day:   Intake/Output this shift:    LAB RESULTS:  Recent Labs  11/13/15 1414  WBC 8.4  HGB 9.2*  HCT 27.6*  PLT 178   BMET Lab Results  Component Value Date   NA 138 11/13/2015   NA 139 12/26/2014   NA 139 07/03/2014   K 4.1 11/13/2015   K 3.9 12/26/2014   K 3.2* 07/03/2014   CL 102 11/13/2015   CL 100 12/26/2014   CL 101 07/03/2014   CO2 26 11/13/2015   CO2 33* 12/26/2014   CO2 30 07/03/2014   GLUCOSE 203* 11/13/2015   GLUCOSE 142* 12/26/2014   GLUCOSE 179* 07/03/2014   BUN 35* 11/13/2015   BUN 31* 12/26/2014   BUN 26* 07/03/2014   CREATININE 2.33* 11/13/2015  CREATININE 2.07* 12/26/2014   CREATININE 2.0* 07/03/2014   CALCIUM 8.9 11/13/2015   CALCIUM 10.4 12/26/2014   CALCIUM 9.3 07/03/2014   LFT No results for input(s): PROT, ALBUMIN, AST, ALT, ALKPHOS, BILITOT, BILIDIR, IBILI in the last 72 hours. PT/INR Lab Results  Component Value Date   INR 1.47 06/15/2014   INR 1.21 06/14/2014   INR 1.12 06/13/2014   Hepatitis Panel No results for input(s): HEPBSAG, HCVAB, HEPAIGM, HEPBIGM in the last 72 hours. C-Diff No components found for: CDIFF Lipase     Component Value Date/Time   LIPASE 29 09/01/2008 2100    Drugs of Abuse  No results found for: LABOPIA, COCAINSCRNUR, LABBENZ, AMPHETMU, THCU, LABBARB   RADIOLOGY STUDIES: No results found.  ENDOSCOPIC STUDIES: Per HPI  IMPRESSION:   *  Painless lower GI bleed. Assume this is recurrent diverticular bleed.  *  Acute on chronic anemia, owing to current acute blood loss.  *  Coronary artery disease. History of CABG. Cardiac stenting in 2015. On chronic low-dose aspirin and 11th. Last dose of Brilinta was morning 11/11/15.   PLAN:     *  Case discussed with Dr. Havery Moros. Plan is for: Prep tonight, allow clear  liquids. Colonoscopy is set for tomorrow morning. Patient agreeable.  Serial hemoglobins.   Azucena Freed  11/13/2015, 3:53 PM Pager: [phone]

## 2015-11-15 DIAGNOSIS — I214 Non-ST elevation (NSTEMI) myocardial infarction: Secondary | ICD-10-CM

## 2015-11-15 LAB — BASIC METABOLIC PANEL
ANION GAP: 11 (ref 5–15)
BUN: 25 mg/dL — ABNORMAL HIGH (ref 6–20)
CALCIUM: 8.1 mg/dL — AB (ref 8.9–10.3)
CHLORIDE: 109 mmol/L (ref 101–111)
CO2: 21 mmol/L — AB (ref 22–32)
Creatinine, Ser: 2.06 mg/dL — ABNORMAL HIGH (ref 0.61–1.24)
GFR calc non Af Amer: 29 mL/min — ABNORMAL LOW (ref >60)
GFR, EST AFRICAN AMERICAN: 33 mL/min — AB (ref >60)
GLUCOSE: 119 mg/dL — AB (ref 65–99)
POTASSIUM: 3.5 mmol/L (ref 3.5–5.1)
Sodium: 141 mmol/L (ref 135–145)

## 2015-11-15 LAB — GLUCOSE, CAPILLARY
GLUCOSE-CAPILLARY: 114 mg/dL — AB (ref 65–99)
GLUCOSE-CAPILLARY: 105 mg/dL — AB (ref 65–99)
GLUCOSE-CAPILLARY: 210 mg/dL — AB (ref 65–99)
Glucose-Capillary: 96 mg/dL (ref 65–99)
Glucose-Capillary: 105 mg/dL — ABNORMAL HIGH (ref 65–99)
Glucose-Capillary: 103 mg/dL — ABNORMAL HIGH (ref 65–99)
Glucose-Capillary: 172 mg/dL — ABNORMAL HIGH (ref 65–99)

## 2015-11-15 LAB — CBC
HCT: 30.6 % — ABNORMAL LOW (ref 39.0–52.0)
HEMATOCRIT: 32.9 % — AB (ref 39.0–52.0)
HEMOGLOBIN: 10.7 g/dL — AB (ref 13.0–17.0)
Hemoglobin: 10.2 g/dL — ABNORMAL LOW (ref 13.0–17.0)
MCH: 29.8 pg (ref 26.0–34.0)
MCH: 30.4 pg (ref 26.0–34.0)
MCHC: 32.5 g/dL (ref 30.0–36.0)
MCHC: 33.3 g/dL (ref 30.0–36.0)
MCV: 91.6 fL (ref 78.0–100.0)
MCV: 91.1 fL (ref 78.0–100.0)
Platelets: 152 10*3/uL (ref 150–400)
Platelets: 141 10*3/uL — ABNORMAL LOW (ref 150–400)
RBC: 3.59 MIL/uL — AB (ref 4.22–5.81)
RBC: 3.36 MIL/uL — ABNORMAL LOW (ref 4.22–5.81)
RDW: 16.1 % — AB (ref 11.5–15.5)
RDW: 15.8 % — ABNORMAL HIGH (ref 11.5–15.5)
WBC: 10 10*3/uL (ref 4.0–10.5)
WBC: 10.5 10*3/uL (ref 4.0–10.5)

## 2015-11-15 LAB — TROPONIN I: Troponin I: 13.3 ng/mL (ref <0.031)

## 2015-11-15 MED ORDER — INSULIN ASPART 100 UNIT/ML ~~LOC~~ SOLN
0.0000 [IU] | Freq: Every day | SUBCUTANEOUS | Status: DC
  Administered 2015-11-16: 2 [IU] via SUBCUTANEOUS

## 2015-11-15 MED ORDER — INSULIN ASPART 100 UNIT/ML ~~LOC~~ SOLN
0.0000 [IU] | Freq: Three times a day (TID) | SUBCUTANEOUS | Status: DC
  Administered 2015-11-15: 3 [IU] via SUBCUTANEOUS
  Administered 2015-11-16 (×2): 1 [IU] via SUBCUTANEOUS
  Administered 2015-11-17: 3 [IU] via SUBCUTANEOUS
  Administered 2015-11-17: 2 [IU] via SUBCUTANEOUS
  Administered 2015-11-17: 3 [IU] via SUBCUTANEOUS
  Administered 2015-11-18: 2 [IU] via SUBCUTANEOUS
  Administered 2015-11-19: 1 [IU] via SUBCUTANEOUS
  Administered 2015-11-19: 2 [IU] via SUBCUTANEOUS
  Administered 2015-11-19 – 2015-11-20 (×3): 1 [IU] via SUBCUTANEOUS
  Administered 2015-11-20: 3 [IU] via SUBCUTANEOUS
  Administered 2015-11-21: 1 [IU] via SUBCUTANEOUS

## 2015-11-15 NOTE — Progress Notes (Signed)
PROGRESS NOTE  [name] [id number] DOB: 08/02/1932 DOA: 11/13/2015 PCP: [name], MD  Assessment/Plan: Diverticular GI bleed and gastritis -s/p EGD (gastritis) -colonoscopy- bleeding diverticula - clear diet- would need IR consult at that time- would need embolization - GI following  History of coronary artery disease.  -status post coronary artery bypass grafting in 1996 and 2006.  -percutaneous intervention in July 2015 to vein graft and subsequently placed on aspirin and Brilinta. He has been deemed Plavix nonresponder.  -cardiology following-- may start heparin as troponin bumped to 13  Symptomatic anemia. -s/p 3 unit of PRBC-- keep hgb > 10  History of diastolic congestive heart failure. Last transthoracic echocardiogram performed 06/14/2014 that revealed ejection fraction of 60-65% with grade 2 diastolic dysfunction. He had been on Lasix therapy at home which will be held as I suspect he is on depleted from GI bleed. Volume status will be monitored closely particularly as he'll be transfused one unit of packed red blood cells.  Acute on chronic renal failure.  -monitor  Diabetes mellitus. . - Will cover with sliding-scale insulin  Hypertension.  -hold antihypertensive agents. Will monitor blood pressures overnight.  Code Status: full Family Communication: patient/daughter at bedside Disposition Plan:    Consultants:  GI  cards  Procedures:      HPI/Subjective: Chest pain back to baseline  Objective: Filed Vitals:   11/15/15 1100 11/15/15 1136  BP: 136/44 136/44  Pulse: 72 71  Temp:  98.2 F (36.8 C)  Resp: 19 17    Intake/Output Summary (Last 24 hours) at 11/15/15 1215 Last data filed at 11/15/15 0900  Gross per 24 hour  Intake 1590.16 ml  Output   1225 ml  Net 365.16 ml   Filed Weights   11/13/15 1650 11/14/15 0420 11/15/15 0345  Weight: 86.5 kg (190 lb 11.2 oz) 85.594 kg (188 lb 11.2 oz) 84.959 kg (187 lb 4.8 oz)     Exam:   General:  Pale, chronically ill appearing  Cardiovascular: rrr  Respiratory: diminished  Abdomen: +BS, soft  Musculoskeletal: no edema   Data Reviewed: Basic Metabolic Panel:  Recent Labs Lab 11/13/15 1414 11/14/15 0256 11/15/15 1033  NA 138 143 141  K 4.1 3.7 3.5  CL 102 106 109  CO2 26 28 21*  GLUCOSE 203* 118* 119*  BUN 35* 35* 25*  CREATININE 2.33* 2.29* 2.06*  CALCIUM 8.9 8.4* 8.1*   Liver Function Tests:  Recent Labs Lab 11/13/15 1704  AST 31  ALT 27  ALKPHOS 65  BILITOT 0.6  PROT 6.9  ALBUMIN 3.5   No results for input(s): LIPASE, AMYLASE in the last 168 hours. No results for input(s): AMMONIA in the last 168 hours. CBC:  Recent Labs Lab 11/13/15 1414 11/13/15 1704 11/14/15 0256 11/14/15 1248 11/14/15 2209 11/15/15 1000  WBC 8.4 10.2 8.0 11.3* 8.4 10.0  NEUTROABS 6.1  --   --   --   --   --   HGB 9.2* 9.7* 9.2* 9.3* 9.4* 10.7*  HCT 27.6* 28.6* 27.4* 28.3* 28.3* 32.9*  MCV 91.7 92.0 90.7 90.7 90.7 91.6  PLT 178 203 171 160 137* 152   Cardiac Enzymes:  Recent Labs Lab 11/14/15 1248 11/14/15 2209 11/15/15 0436  TROPONINI 0.03 5.67* 13.30*   BNP (last 3 results) No results for input(s): BNP in the last 8760 hours.  ProBNP (last 3 results) No results for input(s): PROBNP in the last 8760 hours.  CBG:  Recent Labs Lab 11/14/15 0424 11/14/15 0802 11/14/15 1139  11/14/15 1653 11/15/15 0724  GLUCAP 105* 118* 129* 128* 96    Recent Results (from the past 240 hour(s))  MRSA PCR Screening     Status: None   Collection Time: 11/14/15  5:12 PM  Result Value Ref Range Status   MRSA by PCR NEGATIVE NEGATIVE Final    Comment:        The GeneXpert MRSA Assay (FDA approved for NASAL specimens only), is one component of a comprehensive MRSA colonization surveillance program. It is not intended to diagnose MRSA infection nor to guide or monitor treatment for MRSA infections.      Studies: No results  found.  Scheduled Meds: . sodium chloride   Intravenous Once  . sodium chloride   Intravenous Once  . insulin aspart  0-15 Units Subcutaneous 6 times per day  . insulin glargine  10 Units Subcutaneous QHS  . metoprolol  5 mg Intravenous 4 times per day  . pantoprazole (PROTONIX) IV  40 mg Intravenous Q24H  . ranolazine  500 mg Oral BID  . sodium chloride  3 mL Intravenous Q12H   Continuous Infusions: . sodium chloride 60 mL/hr at 11/15/15 0123  . nitroGLYCERIN     Antibiotics Given (last 72 hours)    None      Principal Problem:   GI bleed Active Problems:   Type 2 diabetes mellitus with peripheral neuropathy ([name])   CAD (coronary artery disease)   Hx of CABG   History of gastrointestinal bleeding   Acute-on-chronic kidney injury ([place])   Symptomatic anemia   Acute GI bleeding   Diverticulosis of colon with hemorrhage   NSTEMI (non-ST elevated myocardial infarction) ([place])    Time spent: 25 min    [facility] Pager [phone]. If 7PM-7AM, please contact night-coverage at [email or web address], password [facility] 11/15/2015, 12:15 PM  LOS: 2 days

## 2015-11-15 NOTE — Progress Notes (Signed)
Pt had large bright red bloody stool with visible clots. Pt is asymptomatic, denied dizziness, chest pain, or diaphoresis. VSS. Dr. Eliseo Squires and GI MD notified. CBC ordered for now and cycle. GI to call IR and determine if pt is appropriate for embolization, plan of care to be determined.

## 2015-11-15 NOTE — Progress Notes (Signed)
GI update: I was contacted by the nursing staff that the patient had a bloody bowel movement this evening, his first today. Vitals stable, last post-transfusion Hgb has been stable. Normally we would consider IR embolization for recurrent diverticular bleeding if he continues to bleed, however with his GFR in the 30s he is high risk for contrast nephropathy. I spoke with Dr. Kathlene Cote about his case who is requesting a nephrology consult in case we need to consider IR embolization pending his course. Please check Hgb now and repeat in 6 hours, keep Hgb > 10 in light of recent NSTEMI. If he has further bowel movements this evening I would give him a bowel prep if his vitals are stable and he can tolerate it, in case we need to consider a colonoscopy tomorrow. However, I would prefer to hold off on any endoscopic in this patient with recent NSTEMI if at all possible given the risks of sedation. We will monitor closely to see if bleeding persists or not. If he has significant bleeding overnight or becomes unstable please contact me. Discussed recommendations with Dr. Rogue Bussing.  Union Cellar, MD [facility] Pager [phone]

## 2015-11-15 NOTE — Progress Notes (Signed)
Patient Name: [name] Date of Encounter: 11/15/2015  Principal Problem:   GI bleed Active Problems:   Type 2 diabetes mellitus with peripheral neuropathy ([place])   CAD (coronary artery disease)   Hx of CABG   History of gastrointestinal bleeding   Acute-on-chronic kidney injury ([place])   Symptomatic anemia   Acute GI bleeding   Diverticulosis of colon with hemorrhage   Length of Stay: 2  SUBJECTIVE  Now chest pain free, no SOB. Received 2 units of PRBC yesterday.  CURRENT MEDS . sodium chloride   Intravenous Once  . sodium chloride   Intravenous Once  . insulin aspart  0-15 Units Subcutaneous 6 times per day  . insulin glargine  10 Units Subcutaneous QHS  . metoprolol  5 mg Intravenous 4 times per day  . pantoprazole (PROTONIX) IV  40 mg Intravenous Q24H  . ranolazine  500 mg Oral BID  . sodium chloride  3 mL Intravenous Q12H    OBJECTIVE  Filed Vitals:   11/15/15 0400 11/15/15 0500 11/15/15 0600 11/15/15 0719  BP: 130/59 127/58 121/59 123/58  Pulse: 79 76 74 72  Temp:    97.9 F (36.6 C)  TempSrc:    Oral  Resp: 22 22 16 18   Height:      Weight:      SpO2: 97% 96% 97% 99%    Intake/Output Summary (Last 24 hours) at 11/15/15 0918 Last data filed at 11/15/15 0600  Gross per 24 hour  Intake 1410.16 ml  Output    675 ml  Net 735.16 ml   Filed Weights   11/13/15 1650 11/14/15 0420 11/15/15 0345  Weight: 190 lb 11.2 oz (86.5 kg) 188 lb 11.2 oz (85.594 kg) 187 lb 4.8 oz (84.959 kg)    PHYSICAL EXAM  General: Pleasant, NAD. Neuro: Alert and oriented X 3. Moves all extremities spontaneously. Psych: Normal affect. HEENT:  Normal  Neck: Supple without bruits or JVD. Lungs:  Resp regular and unlabored, CTA. Heart: RRR no s3, s4, or murmurs. Abdomen: Soft, non-tender, non-distended, BS + x 4.  Extremities: No clubbing, cyanosis or edema. DP/PT/Radials 2+ and equal bilaterally.  Accessory Clinical Findings  CBC  Recent Labs  11/13/15 1414   11/14/15 1248 11/14/15 2209  WBC 8.4  < > 11.3* 8.4  NEUTROABS 6.1  --   --   --   HGB 9.2*  < > 9.3* 9.4*  HCT 27.6*  < > 28.3* 28.3*  MCV 91.7  < > 90.7 90.7  PLT 178  < > 160 137*  < > = values in this interval not displayed. Basic Metabolic Panel  Recent Labs  11/13/15 1414 11/14/15 0256  NA 138 143  K 4.1 3.7  CL 102 106  CO2 26 28  GLUCOSE 203* 118*  BUN 35* 35*  CREATININE 2.33* 2.29*  CALCIUM 8.9 8.4*   Liver Function Tests  Recent Labs  11/13/15 1704  AST 31  ALT 27  ALKPHOS 65  BILITOT 0.6  PROT 6.9  ALBUMIN 3.5    Recent Labs  11/14/15 1248 11/14/15 2209 11/15/15 0436  TROPONINI 0.03 5.67* 13.30*   TELE: SR, frequent PACs, PVCs    ASSESSMENT AND PLAN  1. GIB - Hb 9.2, in October 2016 10.9, GI consulted, switched Brilinta to Plavix with new chest pain post EGD this am, and new ECG changes. EGD showed gistritis, Colonoscopy showed clots, he is actively bleeding diverticulum in the left colon, suspect in the descending colon,  treated with hemostasis clips and epinephrine injection for hemostasis. No active bleeding noted at the end of the procedure, large hemorrhoids.  2. NSTEMI with Chest pain - yesterday 10/10 with new STD in the lateral leads consistent with lateral ischemia,troponin uptrending to 13, he received betablockers, iv NTG, morphine and 2 units of PRBC to increase Hb > 10 g/dL. Hb stable the last noght, no bleeding or bowel movement since yesterday, we will recheck this am, if stable we will start heparin drip. This was discussed with GI. ECG shows persistent ST depressions in the lateral leads.  He has h/o CAD, History of coronary artery disease status post bypass grafting in 1995 with 3 grafts placed at that time and redo bypass in 2006 with again 3 grafts placed by [name]. He was catheterized by [name] in July 2015 and had a stent placed to the circumflex vein graft.  - now CP post discontinuation of Brilinta, he is  Plavix non-responder, we wont start, he is not a candidate for cath right now, as he has recurrent bleeding with antiplatelet therapy and CKD stage 4.   3. PAF - history of paroxysmal atrial fibrillation on Coumadin anticoagulation in the past which was discontinued because of GI bleed and the need to be on dual antiplatelet therapy. He has maintained sinus rhythm.  4. HLP - restart atorvastatin once able to take PO meds   Signed, Dorothy Spark MD, [facility] 11/15/2015

## 2015-11-15 NOTE — Progress Notes (Signed)
Progress Note   Subjective  Patient underwent EGD and colonoscopy yesterday as outlined below. He developed chest pain following the procedure, and with troponin elevation and new EKG changes was transferred to the ICU, cardiology consulted. He received a PRBC transfusion last night. He denies any further rectal bleeding since the procedure yesterday. He reports his chest pain is better although he has received morphine for this.    Objective   Vital signs in last 24 hours: Temp:  [97.5 F (36.4 C)-99.3 F (37.4 C)] 97.9 F (36.6 C) (01/01 0719) Pulse Rate:  [54-88] 72 (01/01 0719) Resp:  [12-25] 18 (01/01 0719) BP: (100-178)/(44-82) 123/58 mmHg (01/01 0719) SpO2:  [92 %-100 %] 99 % (01/01 0719) Weight:  [187 lb 4.8 oz (84.959 kg)] 187 lb 4.8 oz (84.959 kg) (01/01 0345) Last BM Date: 11/14/15 General:    white male in NAD Heart:  Regular rate and rhythm; no murmurs Lungs: Respirations even and unlabored, lungs CTA bilaterally Abdomen:  Soft, nontender and nondistended. Normal bowel sounds. Extremities:  Without edema. Neurologic:  Alert and oriented,  grossly normal neurologically. Psych:  Cooperative. Normal mood and affect.  Intake/Output from previous day: 12/31 0701 - 01/01 0700 In: 1410.2 [I.V.:740.2; Blood:670] Out: 675 [Urine:675] Intake/Output this shift:    Lab Results:  Recent Labs  11/14/15 0256 11/14/15 1248 11/14/15 2209  WBC 8.0 11.3* 8.4  HGB 9.2* 9.3* 9.4*  HCT 27.4* 28.3* 28.3*  PLT 171 160 137*   BMET  Recent Labs  11/13/15 1414 11/14/15 0256  NA 138 143  K 4.1 3.7  CL 102 106  CO2 26 28  GLUCOSE 203* 118*  BUN 35* 35*  CREATININE 2.33* 2.29*  CALCIUM 8.9 8.4*   LFT  Recent Labs  11/13/15 1704  PROT 6.9  ALBUMIN 3.5  AST 31  ALT 27  ALKPHOS 65  BILITOT 0.6  BILIDIR 0.1  IBILI 0.5   PT/INR  Recent Labs  11/13/15 1704  LABPROT 15.0  INR 1.17    Studies/Results: No results found.     Assessment / Plan:     82 y/o male with history of CAD on brilinta who presented with a GI bleed. He passed dark clots along with some red blood per rectum. EGD yesterday showed some gastritis and old heme but no active bleeding. Colonoscopy showed significant blood and clot burden in the left colon, due to an actively bleeding diverticulum in what was suspected to be the descending colon. This was treated with 2 hemostasis clips and epinephrine injection. Since the procedure he reports he has not had any further rectal bleeding. He received a 1 unit PRBC transfusion overnight and awaiting post-transfusion CBC this morning.  Unfortunately he developed chest pain after the procedure, with new EKG ischemic changes and troponin elevation. Troponins continue to rise, cardiology consulted, with concern for ACS. I spoke with the cardiology service this morning. In regards to the question of starting heparin / anticoagulation, if he needs it from a cardiac standpoint, the patient appears to have had hemostasis since the colonoscopy, and would proceed with it and monitor him closely for recurrent bleeding. If he develops active bleeding again while on heparin I would recommend IR consult for consideration for embolization. I would continue IV PPI given the gastritis noted on EGD, would check H pylori IgG serology to assess for H pylori, and can progress to clear liquid diet at this time otherwise.   Please contact me with change in his  status or recurrence of bleeding.   LaBelle Cellar, MD [facility] Pager [phone]    Principal Problem:   GI bleed Active Problems:   Type 2 diabetes mellitus with peripheral neuropathy ([name])   CAD (coronary artery disease)   Hx of CABG   History of gastrointestinal bleeding   Acute-on-chronic kidney injury ([place])   Symptomatic anemia   Acute GI bleeding   Diverticulosis of colon with hemorrhage     LOS: 2 days   [place]  11/15/2015, 9:14 AM

## 2015-11-15 NOTE — Progress Notes (Signed)
CRITICAL VALUE ALERT  Critical value received:  Troponin 13.3  Date of notification:  No notification due to prior Troponin value critical  Paged MD Claiborne Billings at [phone] to inform of Troponin value.  Patient's chest pain has been a 2-3 out of 10 and PRN morphine available for patient.    Vicie Mutters, RN

## 2015-11-16 ENCOUNTER — Encounter (HOSPITAL_COMMUNITY)
Admission: EM | Disposition: A | Discharge status: Home / Self Care | Payer: Self-pay | Source: Home / Self Care | Attending: Internal Medicine

## 2015-11-16 ENCOUNTER — Encounter (HOSPITAL_COMMUNITY): Payer: Self-pay | Admitting: *Deleted

## 2015-11-16 HISTORY — PX: FLEXIBLE SIGMOIDOSCOPY: SHX5431

## 2015-11-16 LAB — GLUCOSE, CAPILLARY
GLUCOSE-CAPILLARY: 146 mg/dL — AB (ref 65–99)
GLUCOSE-CAPILLARY: 120 mg/dL — AB (ref 65–99)
GLUCOSE-CAPILLARY: 222 mg/dL — AB (ref 65–99)
Glucose-Capillary: 143 mg/dL — ABNORMAL HIGH (ref 65–99)

## 2015-11-16 LAB — CBC
HEMATOCRIT: 29.5 % — AB (ref 39.0–52.0)
HEMOGLOBIN: 9.9 g/dL — AB (ref 13.0–17.0)
MCH: 30.4 pg (ref 26.0–34.0)
MCHC: 33.6 g/dL (ref 30.0–36.0)
MCV: 90.5 fL (ref 78.0–100.0)
Platelets: 150 10*3/uL (ref 150–400)
RBC: 3.26 MIL/uL — ABNORMAL LOW (ref 4.22–5.81)
RDW: 15.8 % — ABNORMAL HIGH (ref 11.5–15.5)
WBC: 10.9 10*3/uL — AB (ref 4.0–10.5)

## 2015-11-16 LAB — BASIC METABOLIC PANEL
ANION GAP: 10 (ref 5–15)
BUN: 24 mg/dL — ABNORMAL HIGH (ref 6–20)
CHLORIDE: 109 mmol/L (ref 101–111)
CO2: 22 mmol/L (ref 22–32)
Calcium: 8.3 mg/dL — ABNORMAL LOW (ref 8.9–10.3)
Creatinine, Ser: 2.03 mg/dL — ABNORMAL HIGH (ref 0.61–1.24)
GFR calc non Af Amer: 29 mL/min — ABNORMAL LOW (ref >60)
GFR, EST AFRICAN AMERICAN: 34 mL/min — AB (ref >60)
Glucose, Bld: 143 mg/dL — ABNORMAL HIGH (ref 65–99)
POTASSIUM: 4 mmol/L (ref 3.5–5.1)
Sodium: 141 mmol/L (ref 135–145)

## 2015-11-16 LAB — HEMOGLOBIN AND HEMATOCRIT, BLOOD
HCT: 32.1 % — ABNORMAL LOW (ref 39.0–52.0)
HEMOGLOBIN: 10.8 g/dL — AB (ref 13.0–17.0)

## 2015-11-16 LAB — HEMOGLOBIN: HEMOGLOBIN: 10.5 g/dL — AB (ref 13.0–17.0)

## 2015-11-16 LAB — PREPARE RBC (CROSSMATCH)

## 2015-11-16 SURGERY — SIGMOIDOSCOPY, FLEXIBLE

## 2015-11-16 MED ORDER — EPINEPHRINE HCL 0.1 MG/ML IJ SOSY
PREFILLED_SYRINGE | INTRAMUSCULAR | Status: AC
  Filled 2015-11-16: qty 10

## 2015-11-16 MED ORDER — SODIUM CHLORIDE 0.9 % IV SOLN: Freq: Once | INTRAVENOUS | Status: DC

## 2015-11-16 MED ORDER — FENTANYL CITRATE (PF) 100 MCG/2ML IJ SOLN
INTRAMUSCULAR | Status: AC
  Filled 2015-11-16: qty 2

## 2015-11-16 MED ORDER — MIDAZOLAM HCL 5 MG/ML IJ SOLN
INTRAMUSCULAR | Status: AC
  Filled 2015-11-16: qty 2

## 2015-11-16 MED ORDER — SODIUM CHLORIDE 0.9 % IJ SOLN: INTRAMUSCULAR | Status: DC | PRN

## 2015-11-16 MED ORDER — PEG-KCL-NACL-NASULF-NA ASC-C 100 G PO SOLR
1.0000 | Freq: Once | ORAL | Status: AC
  Administered 2015-11-16: 200 g via ORAL
  Filled 2015-11-16: qty 1

## 2015-11-16 MED ORDER — SODIUM CHLORIDE 0.9 % IJ SOLN
INTRAMUSCULAR | Status: DC | PRN
  Administered 2015-11-16: 9 mL

## 2015-11-16 MED ORDER — DIPHENHYDRAMINE HCL 50 MG/ML IJ SOLN
INTRAMUSCULAR | Status: AC
  Filled 2015-11-16: qty 1

## 2015-11-16 NOTE — Care Management Important Message (Signed)
Important Message  Patient Details  Name: [name] MRN: [id number] Date of Birth: 10-Feb-1932   Medicare Important Message Given:  Yes    [name] 11/16/2015, 11:06 AMImportant Message  Patient Details  Name: [name] MRN: [id number] Date of Birth: Sep 26, 1932   Medicare Important Message Given:  Yes    [name] 11/16/2015, 11:05 AM

## 2015-11-16 NOTE — Consult Note (Signed)
Requesting Physician:  Dr. Eliseo Squires Reason for Consult:  Pt with known CKD who may require embolization for TMT GI bleed - assess risk of IV contrast HPI: The patient is a 82 y.o. year-old WM with PMH notable for DM, HTN, CAD with prior CABG, dCHF/grade 2 DD, h/o PAF, recurrent GI bleeds. Has known CKD 4 (followed by Dr. Elmarie Shiley - last seen in the office 09/24/15 with creatinine at that time 2.25)  Admitted on the current occasion with BRBPR. Colonoscopy 12/31 showed actively bleeding diverticulum ion the left colon which was injected. He had chest pain with lateral ST changes, troponin to 13.3 ->NSTEMI. Seen by cards who recommended transfusion as need to keep Hb >10, nitro, BB. He has continued to have BRBPR and is going this afternoon for an unsedated flex sig/colonoscopy in attempt to control bleeding. If unsuccessful plan is to contact IR for embolectomy. We are asked to see because of the possible contrast procedure, in light of his CKD.  Pt is currently having BRBPR during interview. Denies at this moment, chest pain, SOB. Has had some LE edema.  No N/V.   Creatinines over past year for reference are as follows: Date/Time Value Ref Range Status  11/16/2015 12:57 AM 2.03* 0.61 - 1.24 mg/dL Final  11/15/2015 10:33 AM 2.06* 0.61 - 1.24 mg/dL Final  11/14/2015 02:56 AM 2.29* 0.61 - 1.24 mg/dL Final  11/13/2015 02:14 PM 2.33* 0.61 - 1.24 mg/dL Final  12/26/2014 11:27 AM 2.07* 0.40 - 1.50 mg/dL Final    Past Medical History  Diagnosis Date  . Anemia     Secondary to acute blood loss  . Hypertension   . Hyperlipidemia   . RBBB (right bundle branch block)   . Age-related macular degeneration, wet, both eyes ([place])   . Carotid artery disease ([place])     Doppler 09/18/2009 - 49% bilateral stenoses  . Personal history of colonic polyps   . Diverticulosis of colon with hemorrhage 2009    several unit diverticular bleed   . Erectile dysfunction     Mild  . Atrial flutter [facility]) 07/2010     September, 2011   Hospital with PNA and cath done.Marland KitchenMarland KitchenCoumadin.  Atrial flutter ablation planned, but  pt. then had atrial fibrillation,/outpatient conversion 09/08/10..NSR..plan to follow..Dr. Caryl Comes  . Gout   . Skin cancer     R lower leg, per derm 2012  . Atrial fibrillation ([place])     Consideration was given for atrial flutter ablation, but patient developed atrial fibrillation. Cardioversion was done. Dr. Caryl Comes decided to watch him clinically. November, 2011  . Carotid artery disease ([name])     49% bilateral, Doppler, November, 2010  . CAD (coronary artery disease)     Catheterization, September, 2011,  grafts patent from redo CABG,, medical therapy of coronary disease, consideration to proceeding with atrial flutter ablation  . Ejection fraction     EF 60%, echo, 2009  //   EF 65%, echo, September, 2011  . Mitral regurgitation     Mild, echo, September, 2011  . Shoulder pain     "positional; better now" (11/13/2015)  . Anginal pain ([place])   . PNA (pneumonia) 9/11    NSTEMI at [facility] with repeat cath, rec medical mgmt   . NSTEMI (non-ST elevated myocardial infarction) ([place]) 07/2010    at [facility] with repeat cath, rec medical mgmt   . OSA on CPAP   . Type II diabetes mellitus ([place])   . Diabetic peripheral neuropathy ([place])   .  History of blood transfusion "several"    "related to diverticular bleeding"  . Arthritis     "mild in hands, knees, ankles" (11/13/2015)  . Chronic kidney disease (CKD), stage III (moderate)      Past Surgical History  Procedure Laterality Date  . Cardiac catheterization  2006    Nuclear..slight lateral ischemia..medical therapy  . Cardiac catheterization  08/04/2010    grafts patent from redo CABG...medical Rx and ablate Atrial flutter (LV not injected)   . Doppler echocardiography  08/2008    EF 60%  . Doppler echocardiography  08/02/2010    65-70%  . Doppler echocardiography  07/2010    MR mild  . Coronary artery bypass graft  1995; 2006    "X 3; X3"  .  Colonoscopy w/ polypectomy    . Cataract extraction w/ intraocular lens  implant, bilateral Bilateral   . Vasectomy    . Tonsillectomy    . Carotid endarterectomy Right 1994  . Coronary artery bypass graft  1995, 2006  . Esophagogastroduodenoscopy N/A 06/19/2014    Procedure: ESOPHAGOGASTRODUODENOSCOPY (EGD);  Surgeon: Jerene Bears, MD;  Location: [facility] ENDOSCOPY;  Service: Endoscopy;  Laterality: N/A;  . Left heart catheterization with coronary/graft angiogram N/A 06/11/2014    Procedure: LEFT HEART CATHETERIZATION WITH Beatrix Fetters;  Surgeon: Sinclair Grooms, MD;  Location: [facility] CATH LAB;  Service: Cardiovascular;  Laterality: N/A;  . Percutaneous coronary stent intervention (pci-s) N/A 06/13/2014    Procedure: PERCUTANEOUS CORONARY STENT INTERVENTION (PCI-S);  Surgeon: Sinclair Grooms, MD;  Location: [facility] CATH LAB;  Service: Cardiovascular;  Laterality: N/A;  . Left heart cath N/A 06/14/2014    Procedure: LEFT HEART CATH;  Surgeon: Sinclair Grooms, MD;  Location: [facility] CATH LAB;  Service: Cardiovascular;  Laterality: N/A;  . Laparoscopic cholecystectomy  2008  . Skin cancer excision Left 10/2015    calf  . Skin cancer excision Right 2014?    chest  . Cardioversion  ~ 2010     Family History  Problem Relation Age of Onset  . Kidney disease Mother     Kidney failure  . Stroke Mother   . Diabetes Mother   . Heart disease Father     MI  . Arthritis Sister   . Cancer Sister     Throat  . Heart disease Sister     MI  . Diabetes Sister   . Prostate cancer Neg Hx   . Colon cancer Neg Hx    Social History:  reports that he has quit smoking. His smoking use included Cigarettes. He has a 8 pack-year smoking history. He has never used smokeless tobacco. He reports that he does not drink alcohol or use illicit drugs.  Allergies: No Known Allergies  Home medications: Prior to Admission medications   Medication Sig Start Date End Date Taking? Authorizing Provider  amLODipine  (NORVASC) 10 MG tablet Take 1 tablet (10 mg total) by mouth every evening. Patient taking differently: Take 10 mg by mouth at bedtime.  10/01/15  Yes [name], MD  aspirin EC 81 MG EC tablet Take 1 tablet (81 mg total) by mouth daily. Patient taking differently: Take 81 mg by mouth at bedtime.  06/19/14  Yes Almyra Deforest, PA  Cholecalciferol (VITAMIN D) 1000 UNITS capsule Take 1,000 Units by mouth daily.    Yes Historical Provider, MD  colchicine 0.6 MG tablet Take 0.6 mg by mouth daily as needed (glout flare up).    Yes Historical  Provider, MD  fish oil-omega-3 fatty acids 1000 MG capsule Take 1 g by mouth daily.    Yes Historical Provider, MD  furosemide (LASIX) 80 MG tablet TAKE 1 TABLET DAILY Patient taking differently: TAKE 1 TABLET BY MOUTH MONDAY, WEDNESDAY, FRIDAY, TAKE 1/2 TABLET ON TUESDAY, THURSDAY, SATURDAY, TAKE 1/2 TABLET ON SUNDAY AS NEEDED FOR ANKLE SWELLING 10/06/15  Yes Carlena Bjornstad, MD  insulin glargine (LANTUS) 100 UNIT/ML injection INJECT 25 UNITS UNDER THE SKIN DAILY AS INSTRUCTED BY DOCTOR Patient taking differently: Inject 25 Units into the skin at bedtime.  04/28/15  Yes Tonia Ghent, MD  isosorbide mononitrate (IMDUR) 30 MG 24 hr tablet TAKE 2 TABLETS TWICE A DAY 08/24/15  Yes Carlena Bjornstad, MD  magnesium oxide (MAG-OX) 400 MG tablet Take 400 mg by mouth daily.    Yes Historical Provider, MD  Multiple Vitamin (MULTIVITAMIN WITH MINERALS) TABS Take 1 tablet by mouth daily.   Yes Historical Provider, MD  nitroGLYCERIN (NITROSTAT) 0.4 MG SL tablet Place 1 tablet (0.4 mg total) under the tongue every 5 (five) minutes as needed for chest pain. 09/04/15  Yes [name], MD  Potassium Gluconate (K-99) 595 MG CAPS Take 595 mg by mouth daily.    Yes Historical Provider, MD  ranolazine (RANEXA) 500 MG 12 hr tablet Take 1 tablet (500 mg total) by mouth 2 (two) times daily. 09/28/15  Yes [name], MD  TOPROL XL 50 MG 24 hr tablet TAKE 1 TABLET DAILY WITH A MEAL OR  IMMEDIATELY FOLLOWING A MEAL 10/06/15  Yes Carlena Bjornstad, MD  traMADol (ULTRAM) 50 MG tablet Take 1 tablet (50 mg total) by mouth every 6 (six) hours as needed. ARTHRITIS Patient taking differently: Take 100 mg by mouth 2 (two) times daily. Scheduled (for arthritis pain) 05/24/15  Yes Tonia Ghent, MD  Blood Glucose Monitoring Suppl (FREESTYLE LITE) DEVI Use to check blood sugar once daily and as needed.  Diagnosis:  E11.40  Insulin dependent. 05/22/15   Tonia Ghent, MD  BRILINTA 90 MG TABS tablet TAKE 1 TABLET TWICE A DAY (PLEASE CALL AND SCHEDULE AN APPOINTMENT) 09/21/15   Carlena Bjornstad, MD  glucose blood (FREESTYLE LITE) test strip Use as instructed to test blood sugar once daily and as needed.  Diagnosis:  E11.40  Insulin Dependent. 05/22/15   Tonia Ghent, MD  Insulin Syringe-Needle U-100 (INSULIN SYRINGE .3CC/31GX5/16") 31G X 5/16" 0.3 ML MISC Use daily as instructed. 12/26/14   Tonia Ghent, MD  rosuvastatin (CRESTOR) 20 MG tablet Take 1 tablet (20 mg total) by mouth daily. 09/28/15   Carlena Bjornstad, MD    Inpatient medications: . [MAR Hold] sodium chloride   Intravenous Once  . [MAR Hold] sodium chloride   Intravenous Once  . sodium chloride   Intravenous Once  . [MAR Hold] insulin aspart  0-5 Units Subcutaneous QHS  . [MAR Hold] insulin aspart  0-9 Units Subcutaneous TID WC  . [MAR Hold] insulin glargine  10 Units Subcutaneous QHS  . [MAR Hold] metoprolol  5 mg Intravenous 4 times per day  . [MAR Hold] pantoprazole (PROTONIX) IV  40 mg Intravenous Q24H  . [MAR Hold] ranolazine  500 mg Oral BID  . [MAR Hold] sodium chloride  3 mL Intravenous Q12H    Review of Systems See HPI    Physical Exam:  BP 142/61 mmHg  Pulse 79  Temp(Src) 98.6 F (37 C) (Oral)  Resp 18  Ht 5\' 9"  (1.753  m)  Wt 85.639 kg (188 lb 12.8 oz)  BMI 27.87 kg/m2  SpO2 96%  Gen: Very nice WM, looks younger than his stated age of 35  (has just gotten off toilet - full of BRB) Skin: no rash,  cyanosis Neck: no JVD Chest: Clear Heart: Regular, no rub or gallop. Normal S1S2 No S3 Abdomen: soft, not tender Ext: Trace pedal edema Neuro: alert, Ox3, no focal deficit   Labs: Basic Metabolic Panel:  Recent Labs Lab 11/13/15 1414 11/14/15 0256 11/15/15 1033 11/16/15 0057  NA 138 143 141 141  K 4.1 3.7 3.5 4.0  CL 102 106 109 109  CO2 26 28 21* 22  GLUCOSE 203* 118* 119* 143*  BUN 35* 35* 25* 24*  CREATININE 2.33* 2.29* 2.06* 2.03*  CALCIUM 8.9 8.4* 8.1* 8.3*     Recent Labs Lab 11/13/15 1704  AST 31  ALT 27  ALKPHOS 65  BILITOT 0.6  PROT 6.9  ALBUMIN 3.5     Recent Labs Lab 11/13/15 1414  11/14/15 2209 11/15/15 1000 11/15/15 1942 11/16/15 0057  WBC 8.4  < > 8.4 10.0 10.5 10.9*  NEUTROABS 6.1  --   --   --   --   --   HGB 9.2*  < > 9.4* 10.7* 10.2* 9.9*  HCT 27.6*  < > 28.3* 32.9* 30.6* 29.5*  MCV 91.7  < > 90.7 91.6 91.1 90.5  PLT 178  < > 137* 152 141* 150  < > = values in this interval not displayed.   Recent Labs Lab 11/14/15 1248 11/14/15 2209 11/15/15 0436  TROPONINI 0.03 5.67* 13.30*   CBG:  Recent Labs Lab 11/15/15 1140 11/15/15 1556 11/15/15 2120 11/16/15 0734 11/16/15 1153  GLUCAP 105* 210* 61* 146* 26*   Background 82 y.o. year-old WM with PMH notable for DM, HTN, CAD with prior CABG, recurrent GI bleeds. Has known CKD4 (followed by Dr. Elmarie Shiley - last seen in the office 09/24/15 with creatinine at that time 2.25)  Admitted on the current occasion with BRBPR. Colonoscopy 12/31 showed actively bleeding diverticulum in the left colon which was injected/clipped. He had chest pain with lateral ST changes, troponin to 13.3 ->NSTEMI. Seen by cards who recommended transfusion as need to keep Hb >10, nitro, BB. He has continued to have BRBPR and is going this afternoon for an unsedated flex sig/colonoscopy in attempt to control bleeding. If unsuccessful plan is to contact IR for embolectomy. We are asked to see because of the  possible contrast procedure, in light of his CKD.  Impression/Plan  1. CKD4 - sits right on the cusp of CKD 3/4 at baseline. Renal function is stable so far this admission. Est GFR is around 25-29. He is at a moderate to high risk of CIN given baseline renal function, but if needed benefit outweighs risk. Pt is aware of the risk of AKI on CKD including the possibility that acute dialysis could be required in the event of AKI, less likely lopng term. He would undergo dialysis if needed. Maintaining hydration, avoiding hypotension (in so far as possible given his ongoing bleeding), limit contrast.  We are on standby if needed. 2. LGIB  - for unprepped study in attempt for hemostasis, embolization if unsuccessful 3. CAD, prior CABG X 2, s/p STEMI -  Trop up to 13.3. Last isch changes on ECG. Per cards. Transfuse prn to keep Hb >10. 4. ABLA anemia - as above 5. DM - ssi 6. HTN - holding meds 7. H/o  Diastolic heart failure, Grade 2 DD  [name],  MD [facility] [phone] pager 11/16/2015, 2:34 PM

## 2015-11-16 NOTE — Progress Notes (Signed)
Patient Name: [name] Date of Encounter: 11/16/2015  Principal Problem:   GI bleed Active Problems:   Type 2 diabetes mellitus with peripheral neuropathy ([place])   CAD (coronary artery disease)   Hx of CABG   History of gastrointestinal bleeding   Acute-on-chronic kidney injury ([place])   Symptomatic anemia   Acute GI bleeding   Diverticulosis of colon with hemorrhage   NSTEMI (non-ST elevated myocardial infarction) ([place])   Length of Stay: 3  SUBJECTIVE  The patient had 2 significantly bloody bowel movements the last night.  CURRENT MEDS . sodium chloride   Intravenous Once  . sodium chloride   Intravenous Once  . sodium chloride   Intravenous Once  . insulin aspart  0-5 Units Subcutaneous QHS  . insulin aspart  0-9 Units Subcutaneous TID WC  . insulin glargine  10 Units Subcutaneous QHS  . metoprolol  5 mg Intravenous 4 times per day  . pantoprazole (PROTONIX) IV  40 mg Intravenous Q24H  . peg 3350 powder  1 kit Oral Once  . ranolazine  500 mg Oral BID  . sodium chloride  3 mL Intravenous Q12H    OBJECTIVE  Filed Vitals:   11/16/15 0735 11/16/15 0758 11/16/15 0800 11/16/15 0802  BP: 132/60   137/62  Pulse:      Temp: 98.5 F (36.9 C) 98.4 F (36.9 C)    TempSrc: Oral Oral    Resp: '19  16 25  '$ Height:      Weight:      SpO2: 98% 95%      Intake/Output Summary (Last 24 hours) at 11/16/15 0954 Last data filed at 11/16/15 0800  Gross per 24 hour  Intake    804 ml  Output      0 ml  Net    804 ml   Filed Weights   11/14/15 0420 11/15/15 0345 11/16/15 0441  Weight: 188 lb 11.2 oz (85.594 kg) 187 lb 4.8 oz (84.959 kg) 188 lb 12.8 oz (85.639 kg)    PHYSICAL EXAM  General: Pleasant, NAD. Neuro: Alert and oriented X 3. Moves all extremities spontaneously. Psych: Normal affect. HEENT:  Normal  Neck: Supple without bruits or JVD. Lungs:  Resp regular and unlabored, CTA. Heart: RRR no s3, s4, or murmurs. Abdomen: Soft, non-tender, non-distended, BS +  x 4.  Extremities: No clubbing, cyanosis or edema. DP/PT/Radials 2+ and equal bilaterally.  Accessory Clinical Findings  CBC  Recent Labs  11/13/15 1414  11/15/15 1942 11/16/15 0057  WBC 8.4  < > 10.5 10.9*  NEUTROABS 6.1  --   --   --   HGB 9.2*  < > 10.2* 9.9*  HCT 27.6*  < > 30.6* 29.5*  MCV 91.7  < > 91.1 90.5  PLT 178  < > 141* 150  < > = values in this interval not displayed. Basic Metabolic Panel  Recent Labs  11/15/15 1033 11/16/15 0057  NA 141 141  K 3.5 4.0  CL 109 109  CO2 21* 22  GLUCOSE 119* 143*  BUN 25* 24*  CREATININE 2.06* 2.03*  CALCIUM 8.1* 8.3*   Liver Function Tests  Recent Labs  11/13/15 1704  AST 31  ALT 27  ALKPHOS 65  BILITOT 0.6  PROT 6.9  ALBUMIN 3.5    Recent Labs  11/14/15 1248 11/14/15 2209 11/15/15 0436  TROPONINI 0.03 5.67* 13.30*   TELE: SR, frequent PACs, PVCs    ASSESSMENT AND PLAN  1. GIB - Hb  9.2, in October 2016 10.9, GI consulted, switched Brilinta to Plavix with new chest pain post EGD this am, and new ECG changes. EGD showed gastritis, Colonoscopy showed clots, he was actively bleeding diverticulum in the left colon, suspect in the descending colon, treated with hemostasis clips and epinephrine injection for hemostasis. No active bleeding noted at the end of the procedure, large hemorrhoids.  He was stable for one day and now has significant GI bleed the last night, this was discussed with GI who is giving more blood transfusions and potentially repeating colonoscopy/IR radiology.  2. NSTEMI with Chest pain - yesterday 10/10 with new STD in the lateral leads consistent with lateral ischemia,troponin uptrending to 13, he received betablockers, iv NTG, morphine and 2 units of PRBC to increase Hb > 10 g/dL. ECG shows persistent ST depressions in the lateral leads.  He has h/o CAD, History of coronary artery disease status post bypass grafting in 1995 with 3 grafts placed at that time and redo bypass in 2006 with  again 3 grafts placed by [name]. He was catheterized by [name] in July 2015 and had a stent placed to the circumflex vein graft.  - now CP post discontinuation of Brilinta, he is Plavix non-responder, we wont start, he is not a candidate for cath right now, as he has recurrent bleeding with antiplatelet therapy and CKD stage 4.   3. PAF - history of paroxysmal atrial fibrillation on Coumadin anticoagulation in the past which was discontinued because of GI bleed and the need to be on dual antiplatelet therapy. He has maintained sinus rhythm.  4. HLP - restart atorvastatin once able to take PO meds  Signed, Dorothy Spark MD, [facility] 11/16/2015

## 2015-11-16 NOTE — Progress Notes (Signed)
PROGRESS NOTE  KEYSTON SOLAND [id number] DOB: [birth year] DOA: 11/13/2015 PCP: [name], MD  [name] is a 82 y.o. male with a past medical history of coronary artery disease status post percutaneous intervention in July 2015 with stenting to high grade obtuse marginal branch of vein graft. Patient was felt to be a Plavix nonresponder for which she was treated with Brilinta in addition to aspirin therapy. He also has a history of paroxysmal atrial fibrillation and previously had been anticoagulated with Coumadin which was discontinued due to GI bleed. Hospitalized about 5 years ago for GI bleed undergoing upper and lower endoscopy. At the time he required blood transfusions. It was felt that GI bleed represented diverticular bleed. He presents to the emergency department today with complaints of bright red blood per rectum associate with dark colored stools. Found to have diverticular bleed.  2 clips and injection done.  After procedure had chest pain and elevated troponin.  Sent to SDU where he was stable 1 days and then had a re-bleed.    Assessment/Plan: Diverticular GI bleed and gastritis -s/p EGD (gastritis) -colonoscopy- bleeding diverticula - had episode of bleeding 1/1 PM, trying to avoid embolization- will give bowel prep- if more active bleeding GI will try colonoscopy with out sedation - GI following - nephro consult  History of coronary artery disease.  -status post coronary artery bypass grafting in 1996 and 2006.  -percutaneous intervention in July 2015 to vein graft and subsequently placed on aspirin and Brilinta. He has been deemed Plavix nonresponder.  -cardiology following-- may start heparin as troponin bumped to 13  Symptomatic anemia. -s/p 3 unit of PRBC-- keep hgb > 10  History of diastolic congestive heart failure. Last transthoracic echocardiogram performed 06/14/2014 that revealed ejection fraction of 60-65% with grade 2 diastolic dysfunction.   Acute  on chronic renal failure.  -monitor -renal consult  Diabetes mellitus. . - Will cover with sliding-scale insulin  Hypertension.  -hold antihypertensive agents. Will monitor blood pressures overnight.  Code Status: full Family Communication: patient at bedside Disposition Plan:    Consultants:  GI  cards  Procedures:      HPI/Subjective: Chest pain back to baseline He does not think his bleeding was severe last night "could see to the bottom of the commode"  Objective: Filed Vitals:   11/16/15 1000 11/16/15 1100  BP:    Pulse:    Temp:    Resp: 28 13    Intake/Output Summary (Last 24 hours) at 11/16/15 1143 Last data filed at 11/16/15 1135  Gross per 24 hour  Intake   2484 ml  Output      0 ml  Net   2484 ml   Filed Weights   11/14/15 0420 11/15/15 0345 11/16/15 0441  Weight: 85.594 kg (188 lb 11.2 oz) 84.959 kg (187 lb 4.8 oz) 85.639 kg (188 lb 12.8 oz)    Exam:   General:  Sitting on side of bed  Cardiovascular: rrr  Respiratory: diminished  Abdomen: +BS, soft  Musculoskeletal: no edema   Data Reviewed: Basic Metabolic Panel:  Recent Labs Lab 11/13/15 1414 11/14/15 0256 11/15/15 1033 11/16/15 0057  NA 138 143 141 141  K 4.1 3.7 3.5 4.0  CL 102 106 109 109  CO2 26 28 21* 22  GLUCOSE 203* 118* 119* 143*  BUN 35* 35* 25* 24*  CREATININE 2.33* 2.29* 2.06* 2.03*  CALCIUM 8.9 8.4* 8.1* 8.3*   Liver Function Tests:  Recent Labs Lab 11/13/15 1704  AST 31  ALT 27  ALKPHOS 65  BILITOT 0.6  PROT 6.9  ALBUMIN 3.5   No results for input(s): LIPASE, AMYLASE in the last 168 hours. No results for input(s): AMMONIA in the last 168 hours. CBC:  Recent Labs Lab 11/13/15 1414  11/14/15 1248 11/14/15 2209 11/15/15 1000 11/15/15 1942 11/16/15 0057  WBC 8.4  < > 11.3* 8.4 10.0 10.5 10.9*  NEUTROABS 6.1  --   --   --   --   --   --   HGB 9.2*  < > 9.3* 9.4* 10.7* 10.2* 9.9*  HCT 27.6*  < > 28.3* 28.3* 32.9* 30.6* 29.5*  MCV 91.7   < > 90.7 90.7 91.6 91.1 90.5  PLT 178  < > 160 137* 152 141* 150  < > = values in this interval not displayed. Cardiac Enzymes:  Recent Labs Lab 11/14/15 1248 11/14/15 2209 11/15/15 0436  TROPONINI 0.03 5.67* 13.30*   BNP (last 3 results) No results for input(s): BNP in the last 8760 hours.  ProBNP (last 3 results) No results for input(s): PROBNP in the last 8760 hours.  CBG:  Recent Labs Lab 11/15/15 0724 11/15/15 1140 11/15/15 1556 11/15/15 2120 11/16/15 0734  GLUCAP 96 105* 210* 172* 146*    Recent Results (from the past 240 hour(s))  MRSA PCR Screening     Status: None   Collection Time: 11/14/15  5:12 PM  Result Value Ref Range Status   MRSA by PCR NEGATIVE NEGATIVE Final    Comment:        The GeneXpert MRSA Assay (FDA approved for NASAL specimens only), is one component of a comprehensive MRSA colonization surveillance program. It is not intended to diagnose MRSA infection nor to guide or monitor treatment for MRSA infections.      Studies: No results found.  Scheduled Meds: . sodium chloride   Intravenous Once  . sodium chloride   Intravenous Once  . sodium chloride   Intravenous Once  . insulin aspart  0-5 Units Subcutaneous QHS  . insulin aspart  0-9 Units Subcutaneous TID WC  . insulin glargine  10 Units Subcutaneous QHS  . metoprolol  5 mg Intravenous 4 times per day  . pantoprazole (PROTONIX) IV  40 mg Intravenous Q24H  . ranolazine  500 mg Oral BID  . sodium chloride  3 mL Intravenous Q12H   Continuous Infusions: . nitroGLYCERIN     Antibiotics Given (last 72 hours)    None      Principal Problem:   GI bleed Active Problems:   Type 2 diabetes mellitus with peripheral neuropathy ([name])   CAD (coronary artery disease)   Hx of CABG   History of gastrointestinal bleeding   Acute-on-chronic kidney injury ([place])   Symptomatic anemia   Acute GI bleeding   Diverticulosis of colon with hemorrhage   NSTEMI (non-ST elevated myocardial  infarction) ([place])    Time spent: 25 min    [facility] Pager [phone]. If 7PM-7AM, please contact night-coverage at [email or web address], password [facility] 11/16/2015, 11:43 AM  LOS: 3 days

## 2015-11-16 NOTE — Interval H&P Note (Signed)
History and Physical Interval Note:  11/16/2015 2:45 PM  [name]  has presented today for surgery, with the diagnosis of diverticular bleeding  The various methods of treatment have been discussed with the patient and family. After consideration of risks, benefits and other options for treatment, the patient has consented to  Procedure(s) with comments: COLONOSCOPY (N/A) - may use conscious sedation, hope to avoid using anything as a surgical intervention .  The patient's history has been reviewed, patient examined, no change in status, stable for surgery.  I have reviewed the patient's chart and labs.  Questions were answered to the patient's satisfaction.     Renelda Loma Derril Franek

## 2015-11-16 NOTE — Progress Notes (Signed)
GI update: The patient completed his bowel preparation a few hours ago and continues to put out a lot of bright red blood per rectum. He is continuing to actively bleed at this time. Options for hemostasis we have discussed are repeat colonoscopy, IR embolization, or surgery. Surgery is a last resort. I discussed his case with Dr. Meda Coffee of Cardiology and Dr. Lorrene Reid of Nephrology. He is moderate to high risk of renal injury with angiography/contrast load, and possibly could need dialysis if he has IR embolization. Given he has also had a NSTEMI recently, I also wish to avoid sedating him for a procedure if possible. Given the location of bleeding is suspected to be the descending colon, I offered him a unsedated flex sig / colonoscopy in attempt for hemostasis as it was successful for a period of time during his initial exam. He understands the risks of the procedure to include bleeding, perforation, and cardiovascular compromise; however if we don't use sedation, we hope to minimize the risks of cardiovascular compromise. Risks of ongoing bleeding and failure to control bleeding include cardiovascular compromise.  After discussion of options, he wants to proceed with repeat colonoscopy/flex sig, will perform at the bedside, without sedation if possible. If we aren't successful in stopping the bleeding we will contact IR for embolization attempt. He agreed. We will otherwise repeat Hgb at this time.   Silerton Cellar, MD [facility] Pager [phone]

## 2015-11-16 NOTE — H&P (View-Only) (Signed)
Progress Note   Subjective  Patient reports having an episode of BRBPR yesterday evening, and then another in the middle of the night. The first episode was higher volume than the last. Hgb drifted to 9.9. He denies chest pain or shortness of breath. No abdominal pains.    Objective   Vital signs in last 24 hours: Temp:  [98 F (36.7 C)-99.8 F (37.7 C)] 98.4 F (36.9 C) (01/02 0758) Pulse Rate:  [69-79] 79 (01/02 0545) Resp:  [16-31] 16 (01/02 0800) BP: (102-141)/(44-64) 132/60 mmHg (01/02 0735) SpO2:  [93 %-100 %] 95 % (01/02 0758) Weight:  [188 lb 12.8 oz (85.639 kg)] 188 lb 12.8 oz (85.639 kg) (01/02 0441) Last BM Date: 11/15/15 General:    white male in NAD Heart:  Regular rate and rhythm; no murmurs Lungs: Respirations even and unlabored, lungs CTA bilaterally Abdomen:  Soft, nontender and nondistended. Normal bowel sounds. Extremities:  Without edema. Neurologic:  Alert and oriented,  grossly normal neurologically. Psych:  Cooperative. Normal mood and affect.  Intake/Output from previous day: 01/01 0701 - 01/02 0700 In: 480 [I.V.:480] Out: 550 [Urine:550] Intake/Output this shift: Total I/O In: 504 [I.V.:100; Blood:404] Out: -   Lab Results:  Recent Labs  11/15/15 1000 11/15/15 1942 11/16/15 0057  WBC 10.0 10.5 10.9*  HGB 10.7* 10.2* 9.9*  HCT 32.9* 30.6* 29.5*  PLT 152 141* 150   BMET  Recent Labs  11/14/15 0256 11/15/15 1033 11/16/15 0057  NA 143 141 141  K 3.7 3.5 4.0  CL 106 109 109  CO2 28 21* 22  GLUCOSE 118* 119* 143*  BUN 35* 25* 24*  CREATININE 2.29* 2.06* 2.03*  CALCIUM 8.4* 8.1* 8.3*   LFT  Recent Labs  11/13/15 1704  PROT 6.9  ALBUMIN 3.5  AST 31  ALT 27  ALKPHOS 65  BILITOT 0.6  BILIDIR 0.1  IBILI 0.5   PT/INR  Recent Labs  11/13/15 1704  LABPROT 15.0  INR 1.17    Studies/Results: No results found.     Assessment / Plan:   82 y/o male with history of CAD on brilinta who presented with a lower GI  bleed. EGD 12/31 showed some gastritis and old heme but no active bleeding. Colonoscopy 12/31 showed significant blood and clot burden in the left colon, due to an actively bleeding diverticulum in what was suspected to be the descending colon. This was treated with 2 hemostasis clips and epinephrine injection. Unfortunately he developed NSTEMI after the procedure. He was transferred to cardiac ward and cardiology consulted. We had discussed initiation of heparin yesterday, he did not receive it yesterday and were considering it for today as he had been doing well without bleeding for 24 hours after the procedure, however unfortunately he has had some recurrent bleeding overnight (2BMs) with downtrend of Hgb. He is hemodynamically stable otherwise, no chest pain.   In normal circumstances, IR guided embolization would be the next step to stop recurrent diverticular bleeding after endoscopic therapy. Unfortunately due to his kidney disease he is at high risk for worsening renal function from contrast. I would like to avoid a repeat endoscopy if at all possible given his recent NSTEMI, however if he has significant or persistent bleeding, we could consider an unsedated or low sedation flex sig to evaluate the descending colon (suspected area of bleeding). At this time, recommend serial Hgb with goal Hgb > 10, and wil give him some bowel prep this morning to see if he  continues to bleed or it it clears. Please consult nephrology in case he warrants IR intervention at some point, to help minimize the risk of contrast injury. If he continues to bleed we will have to decide between repeat colonoscopy and IR embolization and will discuss this issue with cardiology, although am hoping this may stop bleeding with time and we can avoid these procedures. Surgery is a last resort.   I will follow closely, please call me with questions / changes in status.   Greentree Cellar, MD [facility] Pager  [phone]     Principal Problem:   GI bleed Active Problems:   Type 2 diabetes mellitus with peripheral neuropathy ([name])   CAD (coronary artery disease)   Hx of CABG   History of gastrointestinal bleeding   Acute-on-chronic kidney injury ([place])   Symptomatic anemia   Acute GI bleeding   Diverticulosis of colon with hemorrhage   NSTEMI (non-ST elevated myocardial infarction) ([place])     LOS: 3 days   Renelda Loma Armbruster  11/16/2015, 8:04 AM

## 2015-11-16 NOTE — Op Note (Signed)
[facility] [place], [id number]   FLEXIBLE SIGMOIDOSCOPY PROCEDURE REPORT  PATIENT: [name], [name]  MR#: [id number] BIRTHDATE: 1932-08-17 , 4  yrs. old GENDER: male ENDOSCOPIST: Yetta Flock, MD REFERRED BY: PROCEDURE DATE:  11/16/2015 PROCEDURE:   Sigmoidoscopy with control of bleeding ASA CLASS:   Class III INDICATIONS:recurrent diverticular bleeding, recent NSTEMI, chronic kidney disease not a good candidate for IR embolization. MEDICATIONS: no anesthesia  DESCRIPTION OF PROCEDURE:   After the risks benefits and alternatives of the procedure were thoroughly explained, informed consent was obtained.  Digital exam revealed no abnormalities of the rectum. The Pentax Ped Colon [id number]  endoscope was introduced through the anus  and advanced to the descending colon , The exam was Without limitations.    The quality of the prep was The overall prep quality was adequate. . Estimated blood loss is zero unless otherwise noted in this procedure report. The instrument was then slowly withdrawn as the mucosa was fully examined.       COLON FINDINGS: The bowel preparation was adequate.  Severe diverticulosis was noted throughout the left colon.  The area of prior bleeding was encountered in the suspected proximal descending colon, where 2 hemostasis clips were noted.  Slow oozing was noted at the site of the diverticulum, along with an area of ulceration within / near the diverticulum.  Three additional hemostasis clips were applied to the diverticulum area to close it more definitively, given much better visualization was achieved on this exam.  A few cc's of epinephrine were placed at the site as well following hemostasis clip placement.  Following these interventions hemostasis was thought to have been achieved after monitoring the area for a few minutes.  The endoscope was then withdrawn and retroflexion was not performed, given  no sedation was given during this procedure.  The patient tolerated the procedure very well with stable hemodynamics.          The scope was then withdrawn from the patient and the procedure terminated.  COMPLICATIONS: There were no immediate complications.  ENDOSCOPIC IMPRESSION: Active oozing from the suspected proximal descending colon diverticulum, where 2 hemostasis clips were noted, described as above. Treated with 3 additional hemostasis clips and epinephrine injection with good result Severe left sided diverticulosis      RECOMMENDATIONS: Serial H/H Monitor for recurrent bleeding Clear liquid diet okay If the patient has further bleeding despite 2 endoscopic therapies up to this point, I would recommend he proceed with IR guided embolization, undestanding the risks of nephrotoxicity, as discussed with nephrology and primary service. The patient is in agreement with this plan Please call if he has evidence of recurrent bleeding after this procedure   eSigned:  Yetta Flock, MD 11/16/2015 3:53 PM   CC:  PATIENT NAME:  [name], [name] MR#: [id number]

## 2015-11-16 NOTE — Care Management Note (Signed)
Case Management Note  Patient Details  Name: [name] MRN: [id number] Date of Birth: [birth year]  Subjective/Objective:      Adm w gi bleed, mi              Action/Plan: lives w fam, pcp dr Damita Dunnings   Expected Discharge Date:                  Expected Discharge Plan:     In-House Referral:     Discharge planning Services     Post Acute Care Choice:    Choice offered to:     DME Arranged:    DME Agency:     HH Arranged:    [place] Agency:     Status of Service:     Medicare Important Message Given:    Date Medicare IM Given:    Medicare IM give by:    Date Additional Medicare IM Given:    Additional Medicare Important Message give by:     If discussed at [place] of Stay Meetings, dates discussed:    Additional Comments: ur review done  Lacretia Leigh, RN 11/16/2015, 9:04 AM

## 2015-11-16 NOTE — Progress Notes (Signed)
Referring Physician(s): TRH/Vann Armbruster  Chief Complaint:  Lower GI bleed  Subjective:  Hx CAD--on Brilinta Presented with BRBPR EGD: no active bleeding Colonoscopy did reveal bleeding diverticulum: hemostasis with clipping and epinephrine Suffered NSTEMI post procedure Decided against heparin treatment secondary new bleeding 24 hrs later Hx CAD Hx PAF Acute on chronic renal failure Dr Kathlene Cote was consulted regarding possible mesenteric arteriogram with possible embolization High risk for worsening renal failure Per Dr Havery Moros note-- Plan is to hope to avoid IR procedure unless re bleeds and needs emergently  Allergies: Review of patient's allergies indicates no known allergies.  Medications: Prior to Admission medications   Medication Sig Start Date End Date Taking? Authorizing Provider  amLODipine (NORVASC) 10 MG tablet Take 1 tablet (10 mg total) by mouth every evening. Patient taking differently: Take 10 mg by mouth at bedtime.  10/01/15  Yes [name], MD  aspirin EC 81 MG EC tablet Take 1 tablet (81 mg total) by mouth daily. Patient taking differently: Take 81 mg by mouth at bedtime.  06/19/14  Yes Almyra Deforest, PA  Cholecalciferol (VITAMIN D) 1000 UNITS capsule Take 1,000 Units by mouth daily.    Yes Historical Provider, MD  colchicine 0.6 MG tablet Take 0.6 mg by mouth daily as needed (glout flare up).    Yes Historical Provider, MD  fish oil-omega-3 fatty acids 1000 MG capsule Take 1 g by mouth daily.    Yes Historical Provider, MD  furosemide (LASIX) 80 MG tablet TAKE 1 TABLET DAILY Patient taking differently: TAKE 1 TABLET BY MOUTH MONDAY, WEDNESDAY, FRIDAY, TAKE 1/2 TABLET ON TUESDAY, THURSDAY, SATURDAY, TAKE 1/2 TABLET ON SUNDAY AS NEEDED FOR ANKLE SWELLING 10/06/15  Yes Carlena Bjornstad, MD  insulin glargine (LANTUS) 100 UNIT/ML injection INJECT 25 UNITS UNDER THE SKIN DAILY AS INSTRUCTED BY DOCTOR Patient taking differently: Inject 25 Units into the  skin at bedtime.  04/28/15  Yes Tonia Ghent, MD  isosorbide mononitrate (IMDUR) 30 MG 24 hr tablet TAKE 2 TABLETS TWICE A DAY 08/24/15  Yes Carlena Bjornstad, MD  magnesium oxide (MAG-OX) 400 MG tablet Take 400 mg by mouth daily.    Yes Historical Provider, MD  Multiple Vitamin (MULTIVITAMIN WITH MINERALS) TABS Take 1 tablet by mouth daily.   Yes Historical Provider, MD  nitroGLYCERIN (NITROSTAT) 0.4 MG SL tablet Place 1 tablet (0.4 mg total) under the tongue every 5 (five) minutes as needed for chest pain. 09/04/15  Yes [name], MD  Potassium Gluconate (K-99) 595 MG CAPS Take 595 mg by mouth daily.    Yes Historical Provider, MD  ranolazine (RANEXA) 500 MG 12 hr tablet Take 1 tablet (500 mg total) by mouth 2 (two) times daily. 09/28/15  Yes [name], MD  TOPROL XL 50 MG 24 hr tablet TAKE 1 TABLET DAILY WITH A MEAL OR IMMEDIATELY FOLLOWING A MEAL 10/06/15  Yes Carlena Bjornstad, MD  traMADol (ULTRAM) 50 MG tablet Take 1 tablet (50 mg total) by mouth every 6 (six) hours as needed. ARTHRITIS Patient taking differently: Take 100 mg by mouth 2 (two) times daily. Scheduled (for arthritis pain) 05/24/15  Yes Tonia Ghent, MD  Blood Glucose Monitoring Suppl (FREESTYLE LITE) DEVI Use to check blood sugar once daily and as needed.  Diagnosis:  E11.40  Insulin dependent. 05/22/15   Tonia Ghent, MD  BRILINTA 90 MG TABS tablet TAKE 1 TABLET TWICE A DAY (PLEASE CALL AND SCHEDULE AN APPOINTMENT) 09/21/15   Dellis Filbert  Larina Earthly, MD  glucose blood (FREESTYLE LITE) test strip Use as instructed to test blood sugar once daily and as needed.  Diagnosis:  E11.40  Insulin Dependent. 05/22/15   Tonia Ghent, MD  Insulin Syringe-Needle U-100 (INSULIN SYRINGE .3CC/31GX5/16") 31G X 5/16" 0.3 ML MISC Use daily as instructed. 12/26/14   Tonia Ghent, MD  rosuvastatin (CRESTOR) 20 MG tablet Take 1 tablet (20 mg total) by mouth daily. 09/28/15   Carlena Bjornstad, MD     Vital Signs: BP 137/62 mmHg  Pulse 79   Temp(Src) 98.4 F (36.9 C) (Oral)  Resp 25  Ht 5\' 9"  (1.753 m)  Wt 188 lb 12.8 oz (85.639 kg)  BMI 27.87 kg/m2  SpO2 95%  Physical Exam  Cardiovascular: Normal rate and regular rhythm.   Pulmonary/Chest: Effort normal and breath sounds normal.  Abdominal: Soft. Bowel sounds are normal.  Musculoskeletal: Normal range of motion.  Neurological: He is alert.  Skin: Skin is warm.  Psychiatric: He has a normal mood and affect. His behavior is normal. Judgment and thought content normal.  Nursing note and vitals reviewed.   Imaging: No results found.  Labs:  CBC:  Recent Labs  11/14/15 2209 11/15/15 1000 11/15/15 1942 11/16/15 0057  WBC 8.4 10.0 10.5 10.9*  HGB 9.4* 10.7* 10.2* 9.9*  HCT 28.3* 32.9* 30.6* 29.5*  PLT 137* 152 141* 150    COAGS:  Recent Labs  11/13/15 1704  INR 1.17    BMP:  Recent Labs  11/13/15 1414 11/14/15 0256 11/15/15 1033 11/16/15 0057  NA 138 143 141 141  K 4.1 3.7 3.5 4.0  CL 102 106 109 109  CO2 26 28 21* 22  GLUCOSE 203* 118* 119* 143*  BUN 35* 35* 25* 24*  CALCIUM 8.9 8.4* 8.1* 8.3*  CREATININE 2.33* 2.29* 2.06* 2.03*  GFRNONAA 25* 25* 29* 29*  GFRAA 29* 29* 33* 34*    LIVER FUNCTION TESTS:  Recent Labs  12/26/14 1127 11/13/15 1704  BILITOT  --  0.6  AST  --  31  ALT  --  27  ALKPHOS  --  65  PROT  --  6.9  ALBUMIN 4.5 3.5    Assessment and Plan:  Lower GI bleed H/H stable today No bleeding in approx 12 hrs Pt knows we are aware of him Would move ahead with procedure if MD felt emergent He is aware and agreeable  Signed: Juandavid Dallman A 11/16/2015, 10:22 AM   I spent a total of 15 Minutes at the the patient's bedside AND on the patient's hospital floor or unit, greater than 50% of which was counseling/coordinating care for GI bleed

## 2015-11-16 NOTE — Progress Notes (Signed)
Progress Note   Subjective  Patient reports having an episode of BRBPR yesterday evening, and then another in the middle of the night. The first episode was higher volume than the last. Hgb drifted to 9.9. He denies chest pain or shortness of breath. No abdominal pains.    Objective   Vital signs in last 24 hours: Temp:  [98 F (36.7 C)-99.8 F (37.7 C)] 98.4 F (36.9 C) (01/02 0758) Pulse Rate:  [69-79] 79 (01/02 0545) Resp:  [16-31] 16 (01/02 0800) BP: (102-141)/(44-64) 132/60 mmHg (01/02 0735) SpO2:  [93 %-100 %] 95 % (01/02 0758) Weight:  [188 lb 12.8 oz (85.639 kg)] 188 lb 12.8 oz (85.639 kg) (01/02 0441) Last BM Date: 11/15/15 General:    white male in NAD Heart:  Regular rate and rhythm; no murmurs Lungs: Respirations even and unlabored, lungs CTA bilaterally Abdomen:  Soft, nontender and nondistended. Normal bowel sounds. Extremities:  Without edema. Neurologic:  Alert and oriented,  grossly normal neurologically. Psych:  Cooperative. Normal mood and affect.  Intake/Output from previous day: 01/01 0701 - 01/02 0700 In: 480 [I.V.:480] Out: 550 [Urine:550] Intake/Output this shift: Total I/O In: 504 [I.V.:100; Blood:404] Out: -   Lab Results:  Recent Labs  11/15/15 1000 11/15/15 1942 11/16/15 0057  WBC 10.0 10.5 10.9*  HGB 10.7* 10.2* 9.9*  HCT 32.9* 30.6* 29.5*  PLT 152 141* 150   BMET  Recent Labs  11/14/15 0256 11/15/15 1033 11/16/15 0057  NA 143 141 141  K 3.7 3.5 4.0  CL 106 109 109  CO2 28 21* 22  GLUCOSE 118* 119* 143*  BUN 35* 25* 24*  CREATININE 2.29* 2.06* 2.03*  CALCIUM 8.4* 8.1* 8.3*   LFT  Recent Labs  11/13/15 1704  PROT 6.9  ALBUMIN 3.5  AST 31  ALT 27  ALKPHOS 65  BILITOT 0.6  BILIDIR 0.1  IBILI 0.5   PT/INR  Recent Labs  11/13/15 1704  LABPROT 15.0  INR 1.17    Studies/Results: No results found.     Assessment / Plan:   82 y/o male with history of CAD on brilinta who presented with a lower GI  bleed. EGD 12/31 showed some gastritis and old heme but no active bleeding. Colonoscopy 12/31 showed significant blood and clot burden in the left colon, due to an actively bleeding diverticulum in what was suspected to be the descending colon. This was treated with 2 hemostasis clips and epinephrine injection. Unfortunately he developed NSTEMI after the procedure. He was transferred to cardiac ward and cardiology consulted. We had discussed initiation of heparin yesterday, he did not receive it yesterday and were considering it for today as he had been doing well without bleeding for 24 hours after the procedure, however unfortunately he has had some recurrent bleeding overnight (2BMs) with downtrend of Hgb. He is hemodynamically stable otherwise, no chest pain.   In normal circumstances, IR guided embolization would be the next step to stop recurrent diverticular bleeding after endoscopic therapy. Unfortunately due to his kidney disease he is at high risk for worsening renal function from contrast. I would like to avoid a repeat endoscopy if at all possible given his recent NSTEMI, however if he has significant or persistent bleeding, we could consider an unsedated or low sedation flex sig to evaluate the descending colon (suspected area of bleeding). At this time, recommend serial Hgb with goal Hgb > 10, and wil give him some bowel prep this morning to see if he  continues to bleed or it it clears. Please consult nephrology in case he warrants IR intervention at some point, to help minimize the risk of contrast injury. If he continues to bleed we will have to decide between repeat colonoscopy and IR embolization and will discuss this issue with cardiology, although am hoping this may stop bleeding with time and we can avoid these procedures. Surgery is a last resort.   I will follow closely, please call me with questions / changes in status.   [name], MD [facility] Pager  [phone]     Principal Problem:   GI bleed Active Problems:   Type 2 diabetes mellitus with peripheral neuropathy ([name])   CAD (coronary artery disease)   Hx of CABG   History of gastrointestinal bleeding   Acute-on-chronic kidney injury ([place])   Symptomatic anemia   Acute GI bleeding   Diverticulosis of colon with hemorrhage   NSTEMI (non-ST elevated myocardial infarction) ([place])     LOS: 3 days   [name]  11/16/2015, 8:04 AM

## 2015-11-17 ENCOUNTER — Encounter (HOSPITAL_COMMUNITY): Payer: Self-pay | Admitting: Gastroenterology

## 2015-11-17 ENCOUNTER — Inpatient Hospital Stay (HOSPITAL_COMMUNITY): Payer: Medicare Other

## 2015-11-17 DIAGNOSIS — R509 Fever, unspecified: Secondary | ICD-10-CM

## 2015-11-17 DIAGNOSIS — J189 Pneumonia, unspecified organism: Secondary | ICD-10-CM

## 2015-11-17 LAB — RENAL FUNCTION PANEL
ANION GAP: 10 (ref 5–15)
Albumin: 2.9 g/dL — ABNORMAL LOW (ref 3.5–5.0)
BUN: 12 mg/dL (ref 6–20)
CHLORIDE: 109 mmol/L (ref 101–111)
CO2: 22 mmol/L (ref 22–32)
Calcium: 8.5 mg/dL — ABNORMAL LOW (ref 8.9–10.3)
Creatinine, Ser: 1.98 mg/dL — ABNORMAL HIGH (ref 0.61–1.24)
GFR calc Af Amer: 35 mL/min — ABNORMAL LOW (ref >60)
GFR, EST NON AFRICAN AMERICAN: 30 mL/min — AB (ref >60)
Glucose, Bld: 254 mg/dL — ABNORMAL HIGH (ref 65–99)
POTASSIUM: 3.6 mmol/L (ref 3.5–5.1)
Phosphorus: 1.5 mg/dL — ABNORMAL LOW (ref 2.5–4.6)
Sodium: 141 mmol/L (ref 135–145)

## 2015-11-17 LAB — TYPE AND SCREEN
ABO/RH(D): O NEG
Antibody Screen: NEGATIVE
UNIT DIVISION: 0
UNIT DIVISION: 0
Unit division: 0
Unit division: 0

## 2015-11-17 LAB — GLUCOSE, CAPILLARY
GLUCOSE-CAPILLARY: 151 mg/dL — AB (ref 65–99)
GLUCOSE-CAPILLARY: 240 mg/dL — AB (ref 65–99)
Glucose-Capillary: 250 mg/dL — ABNORMAL HIGH (ref 65–99)

## 2015-11-17 MED ORDER — PERFLUTREN LIPID MICROSPHERE
1.0000 mL | INTRAVENOUS | Status: AC | PRN
  Administered 2015-11-17: 3 mL via INTRAVENOUS
  Filled 2015-11-17: qty 10

## 2015-11-17 MED ORDER — LEVALBUTEROL HCL 1.25 MG/0.5ML IN NEBU
1.2500 mg | INHALATION_SOLUTION | Freq: Once | RESPIRATORY_TRACT | Status: AC
  Administered 2015-11-17: 1.25 mg via RESPIRATORY_TRACT
  Filled 2015-11-17: qty 0.5

## 2015-11-17 MED ORDER — VANCOMYCIN HCL 10 G IV SOLR
2000.0000 mg | Freq: Once | INTRAVENOUS | Status: AC
  Administered 2015-11-17: 2000 mg via INTRAVENOUS
  Filled 2015-11-17: qty 2000

## 2015-11-17 MED ORDER — PIPERACILLIN-TAZOBACTAM 3.375 G IVPB
3.3750 g | Freq: Three times a day (TID) | INTRAVENOUS | Status: DC
  Administered 2015-11-17 – 2015-11-19 (×7): 3.375 g via INTRAVENOUS
  Filled 2015-11-17 (×9): qty 50

## 2015-11-17 MED ORDER — FUROSEMIDE 10 MG/ML IJ SOLN
40.0000 mg | Freq: Once | INTRAMUSCULAR | Status: AC
  Administered 2015-11-17: 40 mg via INTRAVENOUS
  Filled 2015-11-17: qty 4

## 2015-11-17 MED ORDER — LEVALBUTEROL HCL 0.63 MG/3ML IN NEBU
0.6300 mg | INHALATION_SOLUTION | Freq: Four times a day (QID) | RESPIRATORY_TRACT | Status: DC
  Administered 2015-11-17 – 2015-11-18 (×5): 0.63 mg via RESPIRATORY_TRACT
  Filled 2015-11-17 (×5): qty 3

## 2015-11-17 MED ORDER — VANCOMYCIN HCL 10 G IV SOLR
1250.0000 mg | INTRAVENOUS | Status: DC
  Administered 2015-11-18: 1250 mg via INTRAVENOUS
  Filled 2015-11-17: qty 1250

## 2015-11-17 MED ORDER — K PHOS MONO-SOD PHOS DI & MONO 155-852-130 MG PO TABS
500.0000 mg | ORAL_TABLET | Freq: Once | ORAL | Status: AC
  Administered 2015-11-17: 500 mg via ORAL
  Filled 2015-11-17: qty 2

## 2015-11-17 NOTE — Progress Notes (Signed)
Patient Name: [name] Date of Encounter: 11/17/2015  Principal Problem:   GI bleed Active Problems:   Type 2 diabetes mellitus with peripheral neuropathy ([place])   CAD (coronary artery disease)   Hx of CABG   History of gastrointestinal bleeding   Acute-on-chronic kidney injury ([place])   Symptomatic anemia   Acute GI bleeding   Diverticulosis of colon with hemorrhage   NSTEMI (non-ST elevated myocardial infarction) ([place])   Length of Stay: 4  SUBJECTIVE  S/p bedside sigmoidoscopy yesterday with findings of severe diverticular bleeding. Hemostasis appears to have been achieved. Troponin elevated to 13. H/H appears to be stable. Febrile overnight - CXR concerning for patchy airspace disease on the right - possible early pneumonia. Started on Zosyn for HCAP coverage. Tylenol PRN for fever. Single dose lasix this am.   CURRENT MEDS . sodium chloride   Intravenous Once  . sodium chloride   Intravenous Once  . sodium chloride   Intravenous Once  . furosemide  40 mg Intravenous Once  . insulin aspart  0-5 Units Subcutaneous QHS  . insulin aspart  0-9 Units Subcutaneous TID WC  . insulin glargine  10 Units Subcutaneous QHS  . levalbuterol  0.63 mg Nebulization Q6H  . metoprolol  5 mg Intravenous 4 times per day  . pantoprazole (PROTONIX) IV  40 mg Intravenous Q24H  . piperacillin-tazobactam (ZOSYN)  IV  3.375 g Intravenous Q8H  . ranolazine  500 mg Oral BID  . sodium chloride  3 mL Intravenous Q12H  . [START ON 11/18/2015] vancomycin  1,250 mg Intravenous Q24H    OBJECTIVE  Filed Vitals:   11/17/15 0000 11/17/15 0335 11/17/15 0513 11/17/15 0708  BP: 134/55 136/60    Pulse:  106  96  Temp: 99.7 F (37.6 C) 100.1 F (37.8 C)  100.9 F (38.3 C)  TempSrc: Oral Oral  Axillary  Resp: 32 19  28  Height:      Weight:  188 lb 14.4 oz (85.684 kg)    SpO2: 92% 91% 96% 91%    Intake/Output Summary (Last 24 hours) at 11/17/15 0805 Last data filed at 11/17/15 0454  Gross per  24 hour  Intake   2300 ml  Output      0 ml  Net   2300 ml   Filed Weights   11/15/15 0345 11/16/15 0441 11/17/15 0335  Weight: 187 lb 4.8 oz (84.959 kg) 188 lb 12.8 oz (85.639 kg) 188 lb 14.4 oz (85.684 kg)    PHYSICAL EXAM  General: Pleasant, NAD. Neuro: Alert and oriented X 3. Moves all extremities spontaneously. Psych: Normal affect. HEENT:  Normal  Neck: Supple without bruits or JVD. Lungs:  Resp regular and unlabored, CTA. Heart: RRR no s3, s4, or murmurs. Abdomen: Soft, non-tender, non-distended, BS + x 4.  Extremities: No clubbing, cyanosis or edema. DP/PT/Radials 2+ and equal bilaterally.  Accessory Clinical Findings  CBC  Recent Labs  11/15/15 1942 11/16/15 0057 11/16/15 1613 11/16/15 2048  WBC 10.5 10.9*  --   --   HGB 10.2* 9.9* 10.5* 10.8*  HCT 30.6* 29.5*  --  32.1*  MCV 91.1 90.5  --   --   PLT 141* 150  --   --    Basic Metabolic Panel  Recent Labs  11/15/15 1033 11/16/15 0057  NA 141 141  K 3.5 4.0  CL 109 109  CO2 21* 22  GLUCOSE 119* 143*  BUN 25* 24*  CREATININE 2.06* 2.03*  CALCIUM 8.1*  8.3*   Liver Function Tests No results for input(s): AST, ALT, ALKPHOS, BILITOT, PROT, ALBUMIN in the last 72 hours.  Recent Labs  11/14/15 1248 11/14/15 2209 11/15/15 0436  TROPONINI 0.03 5.67* 13.30*   TELE: SR, frequent PACs, PVCs    ASSESSMENT AND PLAN  1. GIB - H/H stable overnight p sigmoidoscopy yesterday with clipping of diverticular source.  2. NSTEMI with Chest pain - yesterday 10/10 with new STD in the lateral leads consistent with lateral ischemia,troponin uptrending to 13, he received betablockers, iv NTG, morphine and 2 units of PRBC to increase Hb > 10 g/dL. ECG shows persistent ST depressions in the lateral leads.  He has h/o CAD, History of coronary artery disease status post bypass grafting in 1995 with 3 grafts placed at that time and redo bypass in 2006 with again 3 grafts placed by [name]. He was catheterized by  [name] in July 2015 and had a stent placed to the circumflex vein graft.  - now CP post discontinuation of Brilinta, he is Plavix non-responder, we wont start, he is not a candidate for cath right now, as he has recurrent bleeding with antiplatelet therapy and CKD stage 4.   - will order a limited echo today to look for change in LV function or new wall motion abnormalities secondary to NSTEMI.  3. PAF - history of paroxysmal atrial fibrillation on Coumadin anticoagulation in the past which was discontinued because of GI bleed and the need to be on dual antiplatelet therapy. He has maintained sinus rhythm.  4. HLP - restart atorvastatin once able to take PO meds  5. HCAP - febrile overnight and this am. Looks like right-sided pneumonia on x-ray. Now on zosyn.  Pixie Casino, MD, [facility] Attending Cardiologist [facility] 11/17/2015

## 2015-11-17 NOTE — Progress Notes (Signed)
  Echocardiogram 2D Echocardiogram limited with Definity has been performed.  Darlina Sicilian M 11/17/2015, 10:59 AM

## 2015-11-17 NOTE — Progress Notes (Signed)
ANTIBIOTIC CONSULT NOTE - INITIAL  Pharmacy Consult for Vancomycin, Zosyn Indication: HCAP   No Known Allergies  Patient Measurements: Height: 5\' 9"  (175.3 cm) Weight: 188 lb 14.4 oz (85.684 kg) IBW/kg (Calculated) : 70.7  Vital Signs: Temp: 100.1 F (37.8 C) (01/03 0335) Temp Source: Oral (01/03 0335) BP: 136/60 mmHg (01/03 0335) Pulse Rate: 106 (01/03 0335) Intake/Output from previous day: 01/02 0701 - 01/03 0700 In: 2304 [P.O.:1800; I.V.:100; Blood:404] Out: -  Intake/Output from this shift:    Labs:  Recent Labs  11/15/15 1000 11/15/15 1033 11/15/15 1942 11/16/15 0057 11/16/15 1613 11/16/15 2048  WBC 10.0  --  10.5 10.9*  --   --   HGB 10.7*  --  10.2* 9.9* 10.5* 10.8*  PLT 152  --  141* 150  --   --   CREATININE  --  2.06*  --  2.03*  --   --    Estimated Creatinine Clearance: 31 mL/min (by C-G formula based on Cr of 2.03). No results for input(s): VANCOTROUGH, VANCOPEAK, VANCORANDOM, GENTTROUGH, GENTPEAK, GENTRANDOM, TOBRATROUGH, TOBRAPEAK, TOBRARND, AMIKACINPEAK, AMIKACINTROU, AMIKACIN in the last 72 hours.   Microbiology: Recent Results (from the past 720 hour(s))  MRSA PCR Screening     Status: None   Collection Time: 11/14/15  5:12 PM  Result Value Ref Range Status   MRSA by PCR NEGATIVE NEGATIVE Final    Comment:        The GeneXpert MRSA Assay (FDA approved for NASAL specimens only), is one component of a comprehensive MRSA colonization surveillance program. It is not intended to diagnose MRSA infection nor to guide or monitor treatment for MRSA infections.     Medical History: Past Medical History  Diagnosis Date  . Anemia     Secondary to acute blood loss  . Hypertension   . Hyperlipidemia   . RBBB (right bundle branch block)   . Age-related macular degeneration, wet, both eyes ([place])   . Carotid artery disease ([place])     Doppler 09/18/2009 - 49% bilateral stenoses  . Personal history of colonic polyps   . Diverticulosis of colon  with hemorrhage 2009    several unit diverticular bleed   . Erectile dysfunction     Mild  . Atrial flutter [facility]) 07/2010    September, 2011   Hospital with PNA and cath done.Marland KitchenMarland KitchenCoumadin.  Atrial flutter ablation planned, but  pt. then had atrial fibrillation,/outpatient conversion 09/08/10..NSR..plan to follow..Dr. Caryl Comes  . Gout   . Skin cancer     R lower leg, per derm 2012  . Atrial fibrillation ([place])     Consideration was given for atrial flutter ablation, but patient developed atrial fibrillation. Cardioversion was done. Dr. Caryl Comes decided to watch him clinically. November, 2011  . Carotid artery disease ([name])     49% bilateral, Doppler, November, 2010  . CAD (coronary artery disease)     Catheterization, September, 2011,  grafts patent from redo CABG,, medical therapy of coronary disease, consideration to proceeding with atrial flutter ablation  . Ejection fraction     EF 60%, echo, 2009  //   EF 65%, echo, September, 2011  . Mitral regurgitation     Mild, echo, September, 2011  . Shoulder pain     "positional; better now" (11/13/2015)  . Anginal pain ([place])   . PNA (pneumonia) 9/11    NSTEMI at [facility] with repeat cath, rec medical mgmt   . NSTEMI (non-ST elevated myocardial infarction) ([place]) 07/2010    at [facility] with  repeat cath, rec medical mgmt   . OSA on CPAP   . Type II diabetes mellitus ([place])   . Diabetic peripheral neuropathy ([place])   . History of blood transfusion "several"    "related to diverticular bleeding"  . Arthritis     "mild in hands, knees, ankles" (11/13/2015)  . Chronic kidney disease (CKD), stage III (moderate)     Medications:  Prescriptions prior to admission  Medication Sig Dispense Refill Last Dose  . amLODipine (NORVASC) 10 MG tablet Take 1 tablet (10 mg total) by mouth every evening. (Patient taking differently: Take 10 mg by mouth at bedtime. ) 90 tablet 3 11/12/2015  . aspirin EC 81 MG EC tablet Take 1 tablet (81 mg total) by mouth daily. (Patient taking  differently: Take 81 mg by mouth at bedtime. )   11/12/2015  . Cholecalciferol (VITAMIN D) 1000 UNITS capsule Take 1,000 Units by mouth daily.    11/13/2015 at Unknown time  . colchicine 0.6 MG tablet Take 0.6 mg by mouth daily as needed (glout flare up).    11/10/2015  . fish oil-omega-3 fatty acids 1000 MG capsule Take 1 g by mouth daily.    11/13/2015 at Unknown time  . furosemide (LASIX) 80 MG tablet TAKE 1 TABLET DAILY (Patient taking differently: TAKE 1 TABLET BY MOUTH MONDAY, WEDNESDAY, FRIDAY, TAKE 1/2 TABLET ON TUESDAY, THURSDAY, SATURDAY, TAKE 1/2 TABLET ON SUNDAY AS NEEDED FOR ANKLE SWELLING) 90 tablet 3 11/13/2015  . insulin glargine (LANTUS) 100 UNIT/ML injection INJECT 25 UNITS UNDER THE SKIN DAILY AS INSTRUCTED BY DOCTOR (Patient taking differently: Inject 25 Units into the skin at bedtime. )   11/12/2015  . isosorbide mononitrate (IMDUR) 30 MG 24 hr tablet TAKE 2 TABLETS TWICE A DAY 360 tablet 1 11/13/2015 at am  . magnesium oxide (MAG-OX) 400 MG tablet Take 400 mg by mouth daily.    11/13/2015 at Unknown time  . Multiple Vitamin (MULTIVITAMIN WITH MINERALS) TABS Take 1 tablet by mouth daily.   11/13/2015 at Unknown time  . nitroGLYCERIN (NITROSTAT) 0.4 MG SL tablet Place 1 tablet (0.4 mg total) under the tongue every 5 (five) minutes as needed for chest pain. 100 tablet 1 3 weeks ago  . Potassium Gluconate (K-99) 595 MG CAPS Take 595 mg by mouth daily.    11/13/2015 at Unknown time  . ranolazine (RANEXA) 500 MG 12 hr tablet Take 1 tablet (500 mg total) by mouth 2 (two) times daily. 90 tablet 3 11/13/2015 at am  . TOPROL XL 50 MG 24 hr tablet TAKE 1 TABLET DAILY WITH A MEAL OR IMMEDIATELY FOLLOWING A MEAL 90 tablet 3 11/13/2015 at 900  . traMADol (ULTRAM) 50 MG tablet Take 1 tablet (50 mg total) by mouth every 6 (six) hours as needed. ARTHRITIS (Patient taking differently: Take 100 mg by mouth 2 (two) times daily. Scheduled (for arthritis pain)) 360 tablet 1 11/13/2015 at am  . Blood  Glucose Monitoring Suppl (FREESTYLE LITE) DEVI Use to check blood sugar once daily and as needed.  Diagnosis:  E11.40  Insulin dependent. 1 each 0 Taking  . BRILINTA 90 MG TABS tablet TAKE 1 TABLET TWICE A DAY (PLEASE CALL AND SCHEDULE AN APPOINTMENT) 180 tablet 0 11/10/2015  . glucose blood (FREESTYLE LITE) test strip Use as instructed to test blood sugar once daily and as needed.  Diagnosis:  E11.40  Insulin Dependent. 100 each 3 Taking  . Insulin Syringe-Needle U-100 (INSULIN SYRINGE .3CC/31GX5/16") 31G X 5/16" 0.3 ML MISC Use daily  as instructed. 100 each 3 Taking  . rosuvastatin (CRESTOR) 20 MG tablet Take 1 tablet (20 mg total) by mouth daily. 90 tablet 1 Taking   Assessment: 7 YOM who presented to the ED with diverticular bleed. Pharmacy consulted to start empiric IV antibiotics for HCAP on chest xray. WBC 10.9. Tm 100.49F. CrCl ~ 30-35 mL/min   Goal of Therapy:  Vancomycin trough level 15-20 mcg/ml  Plan:  -Start Zosyn 3.375 gm IV Q 8 hours -Vancomycin 2 gm IV load followed by Vancomycin 1250 mg IV Q 24 hours -Monitor CBC, renal fx, cultures and clinical progress -[place] at [place], PharmD., BCPS Clinical Pharmacist Pager [phone]

## 2015-11-17 NOTE — Progress Notes (Signed)
PROGRESS NOTE  [name] [id number] DOB: 1932/07/04 DOA: 11/13/2015 PCP: [name], MD  [name] is a 82 y.o. male with a past medical history of coronary artery disease status post percutaneous intervention in July 2015 with stenting to high grade obtuse marginal branch of vein graft. Patient was felt to be a Plavix nonresponder for which she was treated with Brilinta in addition to aspirin therapy. He also has a history of paroxysmal atrial fibrillation and previously had been anticoagulated with Coumadin which was discontinued due to GI bleed. Hospitalized about 5 years ago for GI bleed undergoing upper and lower endoscopy. At the time he required blood transfusions. It was felt that GI bleed represented diverticular bleed. He presents to the emergency department today with complaints of bright red blood per rectum associate with dark colored stools. Found to have diverticular bleed.  2 clips and injection done.  After procedure had chest pain and elevated troponin.  Sent to SDU where he was stable 1 days and then had a re-bleed.  Had another colonoscopy- unsedated- clipped again  Assessment/Plan: Diverticular GI bleed and gastritis -s/p EGD (gastritis) -colonoscopy- bleeding diverticula clipped x 2-- had rebleed with repeat colonoscopy and repeat injection and clipping of same area -- GI following - nephro consult in case embolization needed  HCAPNA -IV Abx -wean O2 as tolerated -monitor fever  History of coronary artery disease.  -status post coronary artery bypass grafting in 1996 and 2006.  -percutaneous intervention in July 2015 to vein graft and subsequently placed on aspirin and Brilinta. He has been deemed Plavix nonresponder.  -cardiology following-- may start heparin as troponin bumped to 13  Symptomatic anemia. -s/p 3 unit of PRBC-- keep hgb > 10  History of diastolic congestive heart failure. Last transthoracic echocardiogram performed 06/14/2014 that  revealed ejection fraction of 60-65% with grade 2 diastolic dysfunction.  Lasix x 1 1/3-- positive 6L  chronic renal failure.  -monitor -renal consult  Diabetes mellitus. . - Will cover with sliding-scale insulin  Hypertension.  -hold antihypertensive agents. Will monitor blood pressures overnight.  Code Status: full Family Communication: patient at bedside Disposition Plan: leave in SDU   Consultants:  GI  Cards  nephro  Procedures:      HPI/Subjective: No CP but difficulty breathing last PM  Objective: Filed Vitals:   11/17/15 0800 11/17/15 1110  BP:  142/64  Pulse: 98 96  Temp:  100.9 F (38.3 C)  Resp: 24 26    Intake/Output Summary (Last 24 hours) at 11/17/15 1253 Last data filed at 11/17/15 1214  Gross per 24 hour  Intake    960 ml  Output   1000 ml  Net    -40 ml   Filed Weights   11/15/15 0345 11/16/15 0441 11/17/15 0335  Weight: 84.959 kg (187 lb 4.8 oz) 85.639 kg (188 lb 12.8 oz) 85.684 kg (188 lb 14.4 oz)    Exam:   General:  Sitting on side of bed  Cardiovascular: rrr  Respiratory: crackles on right side  Abdomen: +BS, soft  Musculoskeletal: no edema   Data Reviewed: Basic Metabolic Panel:  Recent Labs Lab 11/13/15 1414 11/14/15 0256 11/15/15 1033 11/16/15 0057 11/17/15 1031  NA 138 143 141 141 141  K 4.1 3.7 3.5 4.0 3.6  CL 102 106 109 109 109  CO2 26 28 21* 22 22  GLUCOSE 203* 118* 119* 143* 254*  BUN 35* 35* 25* 24* 12  CREATININE 2.33* 2.29* 2.06* 2.03* 1.98*  CALCIUM 8.9  8.4* 8.1* 8.3* 8.5*  PHOS  --   --   --   --  1.5*   Liver Function Tests:  Recent Labs Lab 11/13/15 1704 11/17/15 1031  AST 31  --   ALT 27  --   ALKPHOS 65  --   BILITOT 0.6  --   PROT 6.9  --   ALBUMIN 3.5 2.9*   No results for input(s): LIPASE, AMYLASE in the last 168 hours. No results for input(s): AMMONIA in the last 168 hours. CBC:  Recent Labs Lab 11/13/15 1414  11/14/15 1248 11/14/15 2209 11/15/15 1000  11/15/15 1942 11/16/15 0057 11/16/15 1613 11/16/15 2048  WBC 8.4  < > 11.3* 8.4 10.0 10.5 10.9*  --   --   NEUTROABS 6.1  --   --   --   --   --   --   --   --   HGB 9.2*  < > 9.3* 9.4* 10.7* 10.2* 9.9* 10.5* 10.8*  HCT 27.6*  < > 28.3* 28.3* 32.9* 30.6* 29.5*  --  32.1*  MCV 91.7  < > 90.7 90.7 91.6 91.1 90.5  --   --   PLT 178  < > 160 137* 152 141* 150  --   --   < > = values in this interval not displayed. Cardiac Enzymes:  Recent Labs Lab 11/14/15 1248 11/14/15 2209 11/15/15 0436  TROPONINI 0.03 5.67* 13.30*   BNP (last 3 results) No results for input(s): BNP in the last 8760 hours.  ProBNP (last 3 results) No results for input(s): PROBNP in the last 8760 hours.  CBG:  Recent Labs Lab 11/16/15 1153 11/16/15 1604 11/16/15 2124 11/17/15 0712 11/17/15 1113  GLUCAP 120* 143* 222* 151* 250*    Recent Results (from the past 240 hour(s))  MRSA PCR Screening     Status: None   Collection Time: 11/14/15  5:12 PM  Result Value Ref Range Status   MRSA by PCR NEGATIVE NEGATIVE Final    Comment:        The GeneXpert MRSA Assay (FDA approved for NASAL specimens only), is one component of a comprehensive MRSA colonization surveillance program. It is not intended to diagnose MRSA infection nor to guide or monitor treatment for MRSA infections.      Studies: Dg Chest Port 1 View  11/17/2015  CLINICAL DATA:  Acute onset of hypoxia.  Initial encounter. EXAM: PORTABLE CHEST 1 VIEW COMPARISON:  Chest radiograph from 06/15/2014 FINDINGS: The lungs are well-aerated. Patchy right-sided airspace opacification raises concern for pneumonia. The appearance is less typical for asymmetric pulmonary edema. No definite pleural effusion or pneumothorax is seen, though the right costophrenic angle is incompletely imaged on this study. The cardiomediastinal silhouette is borderline normal in size. The patient is status post median sternotomy, with evidence of prior CABG. No acute osseous  abnormalities are seen. Scattered clips are noted at the right side of the neck. IMPRESSION: Patchy right-sided airspace opacification raises concern for pneumonia. The appearance is less typical for asymmetric pulmonary edema. Electronically Signed   By: Garald Balding M.D.   On: 11/17/2015 04:04    Scheduled Meds: . sodium chloride   Intravenous Once  . sodium chloride   Intravenous Once  . sodium chloride   Intravenous Once  . insulin aspart  0-5 Units Subcutaneous QHS  . insulin aspart  0-9 Units Subcutaneous TID WC  . insulin glargine  10 Units Subcutaneous QHS  . levalbuterol  0.63 mg Nebulization Q6H  .  metoprolol  5 mg Intravenous 4 times per day  . piperacillin-tazobactam (ZOSYN)  IV  3.375 g Intravenous Q8H  . ranolazine  500 mg Oral BID  . sodium chloride  3 mL Intravenous Q12H  . [START ON 11/18/2015] vancomycin  1,250 mg Intravenous Q24H   Continuous Infusions: . nitroGLYCERIN     Antibiotics Given (last 72 hours)    Date/Time Action Medication Dose Rate   11/17/15 0454 Given   vancomycin (VANCOCIN) 2,000 mg in sodium chloride 0.9 % 500 mL IVPB 2,000 mg 250 mL/hr   11/17/15 0740 Given   piperacillin-tazobactam (ZOSYN) IVPB 3.375 g 3.375 g 12.5 mL/hr      Principal Problem:   GI bleed Active Problems:   Type 2 diabetes mellitus with peripheral neuropathy ([name])   CAD (coronary artery disease)   Hx of CABG   History of gastrointestinal bleeding   Acute-on-chronic kidney injury ([place])   Symptomatic anemia   Acute GI bleeding   Diverticulosis of colon with hemorrhage   NSTEMI (non-ST elevated myocardial infarction) ([place])   HCAP (healthcare-associated pneumonia)    Time spent: 44 min    [facility] Pager [phone]. If 7PM-7AM, please contact night-coverage at [email or web address], password [facility] 11/17/2015, 12:53 PM  LOS: 4 days

## 2015-11-17 NOTE — Progress Notes (Signed)
[name] Rounding Note  Subjective:  Feels "OK" Got SOB last PM,spiked a fever, CXR Rsided ASDx now on ATB's Got 40 IV lasix this am as well Had colonoscopy with clipping - appears hemostasis achieved and reports no further bleeding  Objective Vital signs in last 24 hours: Filed Vitals:   11/17/15 0513 11/17/15 0708 11/17/15 0800 11/17/15 0923  BP:      Pulse:  96 98   Temp:  100.9 F (38.3 C)    TempSrc:  Axillary    Resp:  28 24   Height:      Weight:      SpO2: 96% 91% 96% 96%   Weight change: 0.045 kg (1.6 oz)  Intake/Output Summary (Last 24 hours) at 11/17/15 0937 Last data filed at 11/17/15 0800  Gross per 24 hour  Intake   2590 ml  Output      0 ml  Net   2590 ml   Physical Examination BP 136/60 mmHg  Pulse 98  Temp(Src) 100.9 F (38.3 C) (Axillary)  Resp 24  Ht 5\' 9"  (1.753 m)  Wt 85.684 kg (188 lb 14.4 oz)  BMI 27.88 kg/m2  SpO2 96% Gen: Very nice WM, looks younger than his stated age of 82 Skin: no rash, cyanosis Chest: Coarse crackles right post lung field, left clear Heart: Regular, no rub or gallop. Normal S1S2 No S3 Abdomen: soft, not tender Ext: Trace pretibial edema Neuro: alert, Ox3, no focal deficit  Labs: RENAL PANEL PENDING FROM TODAY  Basic Metabolic Panel:  Recent Labs Lab 11/13/15 1414 11/14/15 0256 11/15/15 1033 11/16/15 0057  NA 138 143 141 141  K 4.1 3.7 3.5 4.0  CL 102 106 109 109  CO2 26 28 21* 22  GLUCOSE 203* 118* 119* 143*  BUN 35* 35* 25* 24*  CREATININE 2.33* 2.29* 2.06* 2.03*  CALCIUM 8.9 8.4* 8.1* 8.3*   Liver Function Tests:  Recent Labs Lab 11/13/15 1704  AST 31  ALT 27  ALKPHOS 65  BILITOT 0.6  PROT 6.9  ALBUMIN 3.5    CBC:  Recent Labs Lab 11/13/15 1414  11/14/15 2209 11/15/15 1000 11/15/15 1942 11/16/15 0057 11/16/15 1613 11/16/15 2048  WBC 8.4  < > 8.4 10.0 10.5 10.9*  --   --   NEUTROABS 6.1  --   --   --   --   --   --   --   HGB 9.2*  < > 9.4* 10.7* 10.2* 9.9*  10.5* 10.8*  HCT 27.6*  < > 28.3* 32.9* 30.6* 29.5*  --  32.1*  MCV 91.7  < > 90.7 91.6 91.1 90.5  --   --   PLT 178  < > 137* 152 141* 150  --   --   < > = values in this interval not displayed.    Recent Labs Lab 11/14/15 1248 11/14/15 2209 11/15/15 0436  TROPONINI 0.03 5.67* 13.30*   CBG:  Recent Labs Lab 11/16/15 0734 11/16/15 1153 11/16/15 1604 11/16/15 2124 11/17/15 0712  GLUCAP 146* 120* 143* 222* 151*    Studies/Results: Dg Chest Port 1 View  11/17/2015  CLINICAL DATA:  Acute onset of hypoxia.  Initial encounter. EXAM: PORTABLE CHEST 1 VIEW COMPARISON:  Chest radiograph from 06/15/2014 FINDINGS: The lungs are well-aerated. Patchy right-sided airspace opacification raises concern for pneumonia. The appearance is less typical for asymmetric pulmonary edema. No definite pleural effusion or pneumothorax is seen, though the right costophrenic angle is incompletely imaged on this study.  The cardiomediastinal silhouette is borderline normal in size. The patient is status post median sternotomy, with evidence of prior CABG. No acute osseous abnormalities are seen. Scattered clips are noted at the right side of the neck. IMPRESSION: Patchy right-sided airspace opacification raises concern for pneumonia. The appearance is less typical for asymmetric pulmonary edema. Electronically Signed   By: Garald Balding M.D.   On: 11/17/2015 04:04   Medications: . nitroGLYCERIN     . sodium chloride   Intravenous Once  . sodium chloride   Intravenous Once  . sodium chloride   Intravenous Once  . insulin aspart  0-5 Units Subcutaneous QHS  . insulin aspart  0-9 Units Subcutaneous TID WC  . insulin glargine  10 Units Subcutaneous QHS  . levalbuterol  0.63 mg Nebulization Q6H  . metoprolol  5 mg Intravenous 4 times per day  . piperacillin-tazobactam (ZOSYN)  IV  3.375 g Intravenous Q8H  . ranolazine  500 mg Oral BID  . sodium chloride  3 mL Intravenous Q12H  . [START ON 11/18/2015]  vancomycin  1,250 mg Intravenous Q24H   Background 82 y.o. year-old WM with PMH notable for DM, HTN, CAD with prior CABG, recurrent GI bleeds. Has known CKD4 (followed by Dr. Elmarie [name] - last seen in the office 09/24/15 with creatinine at that time 2.25) Admitted on the current occasion with BRBPR. Colonoscopy 12/31 showed actively bleeding diverticulum in the left colon which was injected/clipped. He had chest pain with lateral ST changes, troponin to 13.3 ->NSTEMI. Seen by cards who recommended transfusion as need to keep Hb >10, nitro, BB.  If unsuccessful reattempt to control bleeding vial colonoscopic procedures,  plan is to contact IR for embolectomy. We were asked to see because of the possible contrast procedure, in light of his CKD  Impression/Plan  1. CKD4 - sits right on the cusp of CKD 3/4 at baseline. Renal function stable so far this admission. Est GFR is around 25-29. He is moderate to high risk of CIN given baseline renal function, but if needed benefit outweighs risk. Pt is aware of the risk of AKI on CKD including the possibility that acute dialysis could be required in the event of AKI, less likely long term. He would undergo dialysis if needed. Contrast study not needed so far. We are on standby. Renal labs from AM pending 2. LGIB - s/p repeat scope/clipping/epi injection 1/2 - bleeding appears to have stabilized 3. CAD, prior CABG X 2, s/p STEMI - Trop up to 13.3. Last isch changes on ECG. Per cards. Transfuse prn to keep Hb >10. 4. ABLA anemia - as above 5. DM - ssi 6. HTN - holding meds 7. H/o Diastolic heart failure, Grade 2 DD. 40 IV lasix given this AM - 500 cc UOP so far 8. Fever, CXR ->HCAP. Vanco and zosyn. Will need to be careful with vanco dosing given CKD4.  [name], MD [name] [phone] pager 11/17/2015, 9:37 AM

## 2015-11-17 NOTE — Progress Notes (Signed)
Daily Rounding Note  11/17/2015, 8:09 AM  LOS: 4 days   SUBJECTIVE:       C/o SOB overnight associated with PAF.   Stools tan.  No blood.  No GI complaints.  No chest pain.    OBJECTIVE:         Vital signs in last 24 hours:    Temp:  [98.4 F (36.9 C)-100.9 F (38.3 C)] 100.9 F (38.3 C) (01/03 0708) Pulse Rate:  [79-106] 96 (01/03 0708) Resp:  [13-35] 28 (01/03 0708) BP: (134-153)/(55-78) 136/60 mmHg (01/03 0335) SpO2:  [87 %-100 %] 91 % (01/03 0708) Weight:  [85.684 kg (188 lb 14.4 oz)] 85.684 kg (188 lb 14.4 oz) (01/03 0335) Last BM Date: 11/16/15 Filed Weights   11/15/15 0345 11/16/15 0441 11/17/15 0335  Weight: 84.959 kg (187 lb 4.8 oz) 85.639 kg (188 lb 12.8 oz) 85.684 kg (188 lb 14.4 oz)   General: pleasant, does not look ill.    Heart: RRR, rate high 90s/low 100s.   Chest: clear bil.  No labored breathing or cough.   Abdomen: soft, NT, ND.  Active BS  Extremities: no CCE Neuro/Psych:  Alert.  Oriented x 3.  Moves all 4s.  No tremors   Intake/Output from previous day: 01/02 0701 - 01/03 0700 In: 2804 [P.O.:1800; I.V.:100; Blood:404; IV [id number] Out: -   Intake/Output this shift:    Lab Results:  Recent Labs  11/15/15 1000 11/15/15 1942 11/16/15 0057 11/16/15 1613 11/16/15 2048  WBC 10.0 10.5 10.9*  --   --   HGB 10.7* 10.2* 9.9* 10.5* 10.8*  HCT 32.9* 30.6* 29.5*  --  32.1*  PLT 152 141* 150  --   --    BMET  Recent Labs  11/15/15 1033 11/16/15 0057  NA 141 141  K 3.5 4.0  CL 109 109  CO2 21* 22  GLUCOSE 119* 143*  BUN 25* 24*  CREATININE 2.06* 2.03*  CALCIUM 8.1* 8.3*   LFT No results for input(s): PROT, ALBUMIN, AST, ALT, ALKPHOS, BILITOT, BILIDIR, IBILI in the last 72 hours. PT/INR No results for input(s): LABPROT, INR in the last 72 hours. Hepatitis Panel No results for input(s): HEPBSAG, HCVAB, HEPAIGM, HEPBIGM in the last 72 hours.  Studies/Results: Dg Chest  Port 1 View  11/17/2015  CLINICAL DATA:  Acute onset of hypoxia.  Initial encounter. EXAM: PORTABLE CHEST 1 VIEW COMPARISON:  Chest radiograph from 06/15/2014 FINDINGS: The lungs are well-aerated. Patchy right-sided airspace opacification raises concern for pneumonia. The appearance is less typical for asymmetric pulmonary edema. No definite pleural effusion or pneumothorax is seen, though the right costophrenic angle is incompletely imaged on this study. The cardiomediastinal silhouette is borderline normal in size. The patient is status post median sternotomy, with evidence of prior CABG. No acute osseous abnormalities are seen. Scattered clips are noted at the right side of the neck. IMPRESSION: Patchy right-sided airspace opacification raises concern for pneumonia. The appearance is less typical for asymmetric pulmonary edema. Electronically Signed   By: Garald Balding M.D.   On: 11/17/2015 04:04   Scheduled Meds: . sodium chloride   Intravenous Once  . sodium chloride   Intravenous Once  . sodium chloride   Intravenous Once  . furosemide  40 mg Intravenous Once  . insulin aspart  0-5 Units Subcutaneous QHS  . insulin aspart  0-9 Units Subcutaneous TID WC  . insulin glargine  10 Units Subcutaneous QHS  . levalbuterol  0.63  mg Nebulization Q6H  . metoprolol  5 mg Intravenous 4 times per day  . pantoprazole (PROTONIX) IV  40 mg Intravenous Q24H  . piperacillin-tazobactam (ZOSYN)  IV  3.375 g Intravenous Q8H  . ranolazine  500 mg Oral BID  . sodium chloride  3 mL Intravenous Q12H  . [START ON 11/18/2015] vancomycin  1,250 mg Intravenous Q24H   Continuous Infusions: . nitroGLYCERIN     PRN Meds:.acetaminophen **OR** acetaminophen, metoprolol, morphine injection, nitroGLYCERIN, ondansetron **OR** ondansetron (ZOFRAN) IV, oxyCODONE  ASSESMENT:   *  Diverticular bleeding.  Resolved.  11/14/15 Colonoscopy, poor prep with retained stool and blood clots.  Clips placed to prox descendcolon tic.      11/16/15 Sigmoidoscopy with clipping (3 total) of active oozing in ulcerated diverticulum in prox descending tic in area of previous clipping. [name]   Has not had nuc RBC scan, with CKD and angiogram carries higher nephrotoxic risk.   *  ABL anemia.  S/p 4 PRBCs so far.  Latest completed AM 1/2.  Hgb 10.8.     *  Thrombocytopenia.  Improved.   *   PNA right side, vs asymmetric pulmonary edema. Zosyn/Vanc.   *  CAD.  New MI per EKG, Troponins to 13.3.  On Brilinta (along with ASA, on hold) with, s/p 05/2014 stent, previous CABGs. Chronic exertional angina.   *  CKD.  Day 2   PLAN   *  Stopped Protonix, this is LGIB.  Carb mod/HH diet.   *  ? When will Brilinta be safe to restart?    Azucena Freed  11/17/2015, 8:09 AM Pager: [phone]  GI ATTENDING  Interval history and data reviewed. Patient personally seen and examined. Extremely complicated case reviewed with Dr. Havery Moros. Agree with interval progress note as outlined above. Patient with acute left-sided diverticular bleed. No active bleeding at present. Continue to monitor very closely. Transfuse as clinically indicated. In terms of short-term anticoagulation, it gets down to risk-benefit ratio. If he is felt, by cardiology, to likely derived great benefit from reinstituting anticoagulation at this time, then do so. However, if waiting a few more days is reasonable, then that would be desired. We will continue to follow. Gradually advance diet monitor stools as well as hemoglobin. Discussed with patient. For recurrent bleeding with need angiography is next done.  Docia Chuck. Geri [place]in[place]., M.D. [facility] Division of Gastroenterology

## 2015-11-18 DIAGNOSIS — E876 Hypokalemia: Secondary | ICD-10-CM | POA: Diagnosis not present

## 2015-11-18 DIAGNOSIS — I5031 Acute diastolic (congestive) heart failure: Secondary | ICD-10-CM

## 2015-11-18 LAB — RENAL FUNCTION PANEL
ANION GAP: 9 (ref 5–15)
Albumin: 2.1 g/dL — ABNORMAL LOW (ref 3.5–5.0)
BUN: 14 mg/dL (ref 6–20)
CALCIUM: 7.9 mg/dL — AB (ref 8.9–10.3)
CHLORIDE: 109 mmol/L (ref 101–111)
CO2: 24 mmol/L (ref 22–32)
Creatinine, Ser: 1.83 mg/dL — ABNORMAL HIGH (ref 0.61–1.24)
GFR calc Af Amer: 38 mL/min — ABNORMAL LOW (ref >60)
GFR calc non Af Amer: 33 mL/min — ABNORMAL LOW (ref >60)
GLUCOSE: 144 mg/dL — AB (ref 65–99)
Phosphorus: 2.9 mg/dL (ref 2.5–4.6)
Potassium: 3 mmol/L — ABNORMAL LOW (ref 3.5–5.1)
SODIUM: 142 mmol/L (ref 135–145)

## 2015-11-18 LAB — CBC
HCT: 29.6 % — ABNORMAL LOW (ref 39.0–52.0)
HEMOGLOBIN: 9.5 g/dL — AB (ref 13.0–17.0)
MCH: 29.1 pg (ref 26.0–34.0)
MCHC: 32.1 g/dL (ref 30.0–36.0)
MCV: 90.5 fL (ref 78.0–100.0)
Platelets: 140 10*3/uL — ABNORMAL LOW (ref 150–400)
RBC: 3.27 MIL/uL — ABNORMAL LOW (ref 4.22–5.81)
RDW: 16.3 % — ABNORMAL HIGH (ref 11.5–15.5)
WBC: 8.3 10*3/uL (ref 4.0–10.5)

## 2015-11-18 LAB — GLUCOSE, CAPILLARY
GLUCOSE-CAPILLARY: 124 mg/dL — AB (ref 65–99)
GLUCOSE-CAPILLARY: 190 mg/dL — AB (ref 65–99)
GLUCOSE-CAPILLARY: 118 mg/dL — AB (ref 65–99)
Glucose-Capillary: 159 mg/dL — ABNORMAL HIGH (ref 65–99)

## 2015-11-18 MED ORDER — PANTOPRAZOLE SODIUM 40 MG PO TBEC: 40.0000 mg | DELAYED_RELEASE_TABLET | Freq: Two times a day (BID) | ORAL | Status: DC

## 2015-11-18 MED ORDER — FUROSEMIDE 10 MG/ML IJ SOLN
40.0000 mg | Freq: Two times a day (BID) | INTRAMUSCULAR | Status: DC
  Administered 2015-11-18 (×2): 40 mg via INTRAVENOUS
  Filled 2015-11-18 (×2): qty 4

## 2015-11-18 MED ORDER — PANTOPRAZOLE SODIUM 40 MG PO TBEC
40.0000 mg | DELAYED_RELEASE_TABLET | Freq: Two times a day (BID) | ORAL | Status: DC
  Administered 2015-11-18 – 2015-11-21 (×6): 40 mg via ORAL
  Filled 2015-11-18 (×6): qty 1

## 2015-11-18 MED ORDER — PANTOPRAZOLE SODIUM 40 MG PO TBEC
40.0000 mg | DELAYED_RELEASE_TABLET | Freq: Every day | ORAL | Status: DC
  Administered 2015-11-18: 40 mg via ORAL
  Filled 2015-11-18: qty 1

## 2015-11-18 MED ORDER — PANTOPRAZOLE SODIUM 40 MG PO TBEC: 40.0000 mg | DELAYED_RELEASE_TABLET | Freq: Every day | ORAL | Status: DC

## 2015-11-18 MED ORDER — CETYLPYRIDINIUM CHLORIDE 0.05 % MT LIQD
7.0000 mL | Freq: Two times a day (BID) | OROMUCOSAL | Status: DC
  Administered 2015-11-18 – 2015-11-20 (×5): 7 mL via OROMUCOSAL

## 2015-11-18 MED ORDER — POTASSIUM CHLORIDE CRYS ER 20 MEQ PO TBCR
40.0000 meq | EXTENDED_RELEASE_TABLET | ORAL | Status: AC
  Administered 2015-11-18 (×2): 40 meq via ORAL
  Filled 2015-11-18 (×2): qty 2

## 2015-11-18 MED ORDER — ASPIRIN EC 81 MG PO TBEC
81.0000 mg | DELAYED_RELEASE_TABLET | Freq: Every day | ORAL | Status: DC
  Administered 2015-11-18 – 2015-11-21 (×4): 81 mg via ORAL
  Filled 2015-11-18 (×4): qty 1

## 2015-11-18 MED ORDER — LEVALBUTEROL HCL 0.63 MG/3ML IN NEBU: 0.6300 mg | INHALATION_SOLUTION | Freq: Four times a day (QID) | RESPIRATORY_TRACT | Status: DC | PRN

## 2015-11-18 MED ORDER — ATORVASTATIN CALCIUM 80 MG PO TABS
80.0000 mg | ORAL_TABLET | Freq: Every day | ORAL | Status: DC
  Administered 2015-11-18 – 2015-11-20 (×3): 80 mg via ORAL
  Filled 2015-11-18 (×3): qty 1

## 2015-11-18 NOTE — Progress Notes (Signed)
[facility] Brief Note  Pt has had no further GI bleeding Renal function actually better than most recent baseline of around 2.25 (1.83 today) Appears no contrast studies will be needed Cardiology managing diuretics and K repletion for him  Renal will sign off at this time. Please call if issues arise that we can assist with   [name], MD [place] [phone] Pager 11/18/2015, 8:31 AM

## 2015-11-18 NOTE — Progress Notes (Addendum)
Patient Name: [name] Date of Encounter: 11/18/2015  Principal Problem:   GI bleed Active Problems:   Type 2 diabetes mellitus with peripheral neuropathy ([name])   CAD (coronary artery disease)   Hx of CABG   History of gastrointestinal bleeding   Acute-on-chronic kidney injury ([place])   Symptomatic anemia   Acute GI bleeding   Diverticulosis of colon with hemorrhage   NSTEMI (non-ST elevated myocardial infarction) ([place])   HCAP (healthcare-associated pneumonia)   Length of Stay: 5  SUBJECTIVE  Fevers overnight. On vanc/zosyn for HCAP. 5L positive since admission. No coughing. Still on supplemental oxygen.    CURRENT MEDS . sodium chloride   Intravenous Once  . sodium chloride   Intravenous Once  . sodium chloride   Intravenous Once  . atorvastatin  80 mg Oral q1800  . insulin aspart  0-5 Units Subcutaneous QHS  . insulin aspart  0-9 Units Subcutaneous TID WC  . insulin glargine  10 Units Subcutaneous QHS  . levalbuterol  0.63 mg Nebulization Q6H  . metoprolol  5 mg Intravenous 4 times per day  . piperacillin-tazobactam (ZOSYN)  IV  3.375 g Intravenous Q8H  . ranolazine  500 mg Oral BID  . sodium chloride  3 mL Intravenous Q12H  . vancomycin  1,250 mg Intravenous Q24H    OBJECTIVE  Filed Vitals:   11/18/15 0021 11/18/15 0400 11/18/15 0500 11/18/15 0802  BP:   128/66   Pulse:  89    Temp: 101.2 F (38.4 C)  98.8 F (37.1 C)   TempSrc: Oral  Oral   Resp:  24    Height:      Weight:  189 lb 9.5 oz (86 kg)    SpO2:  97%  97%    Intake/Output Summary (Last 24 hours) at 11/18/15 0812 Last data filed at 11/18/15 0500  Gross per 24 hour  Intake    390 ml  Output   1400 ml  Net  -1010 ml   Filed Weights   11/16/15 0441 11/17/15 0335 11/18/15 0400  Weight: 188 lb 12.8 oz (85.639 kg) 188 lb 14.4 oz (85.684 kg) 189 lb 9.5 oz (86 kg)    PHYSICAL EXAM  General: Pleasant, NAD. Neuro: Alert and oriented X 3. Moves all extremities spontaneously. Psych:  Normal affect. HEENT:  Normal  Neck: JVP elevated to 6 cm Lungs:  Decreased breath sounds at the bases Heart: RRR no s3, s4, or murmurs. Abdomen: Soft, non-tender, non-distended, BS + x 4.  Extremities: 1+ bilateral pitting edema  Accessory Clinical Findings  CBC  Recent Labs  11/16/15 0057  11/16/15 2048 11/18/15 0243  WBC 10.9*  --   --  8.3  HGB 9.9*  < > 10.8* 9.5*  HCT 29.5*  --  32.1* 29.6*  MCV 90.5  --   --  90.5  PLT 150  --   --  140*  < > = values in this interval not displayed. Basic Metabolic Panel  Recent Labs  11/17/15 1031 11/18/15 0243  NA 141 142  K 3.6 3.0*  CL 109 109  CO2 22 24  GLUCOSE 254* 144*  BUN 12 14  CREATININE 1.98* 1.83*  CALCIUM 8.5* 7.9*  PHOS 1.5* 2.9   Liver Function Tests  Recent Labs  11/17/15 1031 11/18/15 0243  ALBUMIN 2.9* 2.1*   No results for input(s): CKTOTAL, CKMB, CKMBINDEX, TROPONINI in the last 72 hours. TELE: SR, frequent PACs, PVCs    ASSESSMENT AND PLAN  1. GIB - H/H stable overnight p sigmoidoscopy yesterday with clipping of diverticular source.  2. NSTEMI with Chest pain - yesterday 10/10 with new STD in the lateral leads consistent with lateral ischemia,troponin uptrending to 13, he received betablockers, iv NTG, morphine and 2 units of PRBC to increase Hb > 10 g/dL. ECG shows persistent ST depressions in the lateral leads.  He has h/o CAD, History of coronary artery disease status post bypass grafting in 1995 with 3 grafts placed at that time and redo bypass in 2006 with again 3 grafts placed by [name]. He was catheterized by [name] in July 2015 and had a stent placed to the circumflex vein graft.  - now CP post discontinuation of Brilinta, he is Plavix non-responder, we wont start, he is not a candidate for cath right now, as he has recurrent bleeding with antiplatelet therapy and CKD stage 4.   Echo yesterday show anterior WMA. EF still 50-55%. He denies any angina and has likely completed  his infarct. Would recommend starting low dose aspirin. He does not require a more potent antiplatelet for his previously placed stent which was >1 year ago. I'm not comfortable starting a potent antiplatelet agent at this time for his recent NSTEMI due to recent significant bleeding.   3. PAF - history of paroxysmal atrial fibrillation on Coumadin anticoagulation in the past which was discontinued because of GI bleed and the need to be on dual antiplatelet therapy. He has had some brief PAF reported.  Not an ideal long-term anticoagulation candidate. May be a candidate for the Watchman LAA occluder device. This patients CHA2DS2-VASc Score and unadjusted Ischemic Stroke Rate (% per year) is equal to 7.2 % stroke rate/year from a score of 5  Above score calculated as 1 point each if present [CHF, HTN, DM, Vascular=MI/PAD/Aortic Plaque, Age if 65-74, or Male] Above score calculated as 2 points each if present [Age > 75, or Stroke/TIA/TE]  4. HLP - restart atorvastatin 80 mg daily today.  5. HCAP - on vanc/zosyn. Still with fevers overnight. No production with cough. On 2L O2.  6. Acute diastolic congestive heart failure - appears volume overloaded, continue lasix diuresis today.  7. AKI - resolving. Start diuresis today with lasix 40 mg BID. Discussed with Dr. Lorrene Reid and she agrees.  8. Hypokalemia - replete with 80 MEQ potassium today.  Pixie Casino, MD, [name] Attending Cardiologist [facility] 11/18/2015

## 2015-11-18 NOTE — Progress Notes (Signed)
PROGRESS NOTE  [name] [id number] DOB: 09/22/1932 DOA: 11/13/2015 PCP: [name], MD  [name] is a 82 y.o. male with a past medical history of coronary artery disease status post percutaneous intervention in July 2015 with stenting to high grade obtuse marginal branch of vein graft. Patient was felt to be a Plavix nonresponder for which she was treated with Brilinta in addition to aspirin therapy. He also has a history of paroxysmal atrial fibrillation and previously had been anticoagulated with Coumadin which was discontinued due to GI bleed. Hospitalized about 5 years ago for GI bleed undergoing upper and lower endoscopy. At the time he required blood transfusions. It was felt that GI bleed represented diverticular bleed. He presents to the emergency department today with complaints of bright red blood per rectum associate with dark colored stools. Found to have diverticular bleed.  2 clips and injection done.  After procedure had chest pain and elevated troponin.  Sent to SDU where he was stable 1 days and then had a re-bleed.  Had another colonoscopy- unsedated- clipped again  Assessment/Plan: Diverticular GI bleed and gastritis -s/p EGD (gastritis). Dr. Havery Moros rec ppi bid x2 weeks, then daily if on Brilinta. ASA being resumed by cardiology -colonoscopy- bleeding diverticula clipped x 2-- had rebleed with repeat colonoscopy and repeat injection and clipping of same area. If rebleeds --> IR embolization  HCAPNA -IV Abx -wean O2 as tolerated Had fever last night. D/c vancomycin, as MRSA PCR with high negative predictive value for MRSA pneumonia  NSTEMI: cardiology starting low dose ASA. Echo with new anterior WMA. EF 50-55%  ABLA -s/p 3 unit of PRBC-- keep hgb > 10. moniotr  History of diastolic congestive heart failure. EF 50-55%. Diuresing per cardiology  Hypokalemia: replete  PAF CHADSVASC 5, not a candidate for anticoag due to recurrent GIB  CKD  3/4 -monitor -renal signed off  Diabetes mellitus. . Cont SSI  Hypertension.  stable  Code Status: full Family Communication: patient at bedside Disposition Plan: leave in SDU   Consultants:  GI  Cards  nephro  IR  Procedures:  EGD  Colonoscopy with clips, epi x2  HPI/Subjective: Feels well. No SOB, CP, bleeding. tol solids. No orthopnea. Some ankle edema  Objective: Filed Vitals:   11/18/15 0500 11/18/15 0833  BP: 128/66 135/57  Pulse:    Temp: 98.8 F (37.1 C) 98.8 F (37.1 C)  Resp:  29    Intake/Output Summary (Last 24 hours) at 11/18/15 0937 Last data filed at 11/18/15 [id number]  Gross per 24 hour  Intake    796 ml  Output   1400 ml  Net   -604 ml   Filed Weights   11/16/15 0441 11/17/15 0335 11/18/15 0400  Weight: 85.639 kg (188 lb 12.8 oz) 85.684 kg (188 lb 14.4 oz) 86 kg (189 lb 9.5 oz)    Exam:   General:  Sitting on side of bed, cofortable  Cardiovascular: rrr without MGR  Respiratory: CTA without WRR  Abdomen: +BS, soft  Ext: bilateral ankle edema  Data Reviewed: Basic Metabolic Panel:  Recent Labs Lab 11/14/15 0256 11/15/15 1033 11/16/15 0057 11/17/15 1031 11/18/15 0243  NA 143 141 141 141 142  K 3.7 3.5 4.0 3.6 3.0*  CL 106 109 109 109 109  CO2 28 21* 22 22 24   GLUCOSE 118* 119* 143* 254* 144*  BUN 35* 25* 24* 12 14  CREATININE 2.29* 2.06* 2.03* 1.98* 1.83*  CALCIUM 8.4* 8.1* 8.3* 8.5* 7.9*  PHOS  --   --   --  1.5* 2.9   Liver Function Tests:  Recent Labs Lab 11/13/15 1704 11/17/15 1031 11/18/15 0243  AST 31  --   --   ALT 27  --   --   ALKPHOS 65  --   --   BILITOT 0.6  --   --   PROT 6.9  --   --   ALBUMIN 3.5 2.9* 2.1*   No results for input(s): LIPASE, AMYLASE in the last 168 hours. No results for input(s): AMMONIA in the last 168 hours. CBC:  Recent Labs Lab 11/13/15 1414  11/14/15 2209 11/15/15 1000 11/15/15 1942 11/16/15 0057 11/16/15 1613 11/16/15 2048 11/18/15 0243  WBC 8.4  < > 8.4  10.0 10.5 10.9*  --   --  8.3  NEUTROABS 6.1  --   --   --   --   --   --   --   --   HGB 9.2*  < > 9.4* 10.7* 10.2* 9.9* 10.5* 10.8* 9.5*  HCT 27.6*  < > 28.3* 32.9* 30.6* 29.5*  --  32.1* 29.6*  MCV 91.7  < > 90.7 91.6 91.1 90.5  --   --  90.5  PLT 178  < > 137* 152 141* 150  --   --  140*  < > = values in this interval not displayed. Cardiac Enzymes:  Recent Labs Lab 11/14/15 1248 11/14/15 2209 11/15/15 0436  TROPONINI 0.03 5.67* 13.30*   BNP (last 3 results) No results for input(s): BNP in the last 8760 hours.  ProBNP (last 3 results) No results for input(s): PROBNP in the last 8760 hours.  CBG:  Recent Labs Lab 11/17/15 0712 11/17/15 1113 11/17/15 1607 11/17/15 2103 11/18/15 0838  GLUCAP 151* 250* 240* 159* 124*    Recent Results (from the past 240 hour(s))  MRSA PCR Screening     Status: None   Collection Time: 11/14/15  5:12 PM  Result Value Ref Range Status   MRSA by PCR NEGATIVE NEGATIVE Final    Comment:        The GeneXpert MRSA Assay (FDA approved for NASAL specimens only), is one component of a comprehensive MRSA colonization surveillance program. It is not intended to diagnose MRSA infection nor to guide or monitor treatment for MRSA infections.      Studies: Dg Chest Port 1 View  11/17/2015  CLINICAL DATA:  Acute onset of hypoxia.  Initial encounter. EXAM: PORTABLE CHEST 1 VIEW COMPARISON:  Chest radiograph from 06/15/2014 FINDINGS: The lungs are well-aerated. Patchy right-sided airspace opacification raises concern for pneumonia. The appearance is less typical for asymmetric pulmonary edema. No definite pleural effusion or pneumothorax is seen, though the right costophrenic angle is incompletely imaged on this study. The cardiomediastinal silhouette is borderline normal in size. The patient is status post median sternotomy, with evidence of prior CABG. No acute osseous abnormalities are seen. Scattered clips are noted at the right side of the neck.  IMPRESSION: Patchy right-sided airspace opacification raises concern for pneumonia. The appearance is less typical for asymmetric pulmonary edema. Electronically Signed   By: Garald Balding M.D.   On: 11/17/2015 04:04    Scheduled Meds: . sodium chloride   Intravenous Once  . sodium chloride   Intravenous Once  . sodium chloride   Intravenous Once  . aspirin EC  81 mg Oral Daily  . atorvastatin  80 mg Oral q1800  . furosemide  40 mg Intravenous BID  . insulin aspart  0-5 Units Subcutaneous QHS  .  insulin aspart  0-9 Units Subcutaneous TID WC  . insulin glargine  10 Units Subcutaneous QHS  . metoprolol  5 mg Intravenous 4 times per day  . pantoprazole  40 mg Oral Q0600  . piperacillin-tazobactam (ZOSYN)  IV  3.375 g Intravenous Q8H  . potassium chloride  40 mEq Oral Q1 Hr x 2  . ranolazine  500 mg Oral BID  . sodium chloride  3 mL Intravenous Q12H  . vancomycin  1,250 mg Intravenous Q24H   Continuous Infusions: . nitroGLYCERIN     Antibiotics Given (last 72 hours)    Date/Time Action Medication Dose Rate   11/17/15 0454 Given   vancomycin (VANCOCIN) 2,000 mg in sodium chloride 0.9 % 500 mL IVPB 2,000 mg 250 mL/hr   11/17/15 0740 Given   piperacillin-tazobactam (ZOSYN) IVPB 3.375 g 3.375 g 12.5 mL/hr   11/17/15 2044 Given   piperacillin-tazobactam (ZOSYN) IVPB 3.375 g 3.375 g 12.5 mL/hr   11/18/15 0411 Given   piperacillin-tazobactam (ZOSYN) IVPB 3.375 g 3.375 g 12.5 mL/hr   11/18/15 [id number] Given   vancomycin (VANCOCIN) 1,250 mg in sodium chloride 0.9 % 250 mL IVPB 1,250 mg 166.7 mL/hr      Time spent: 30 min  [place] [facility]  [email or web address], password [facility] 11/18/2015, 9:37 AM  LOS: 5 days

## 2015-11-18 NOTE — Progress Notes (Signed)
Daily Rounding Note  11/18/2015, 8:11 AM  LOS: 5 days   SUBJECTIVE:       No BMs today.  Stools tan yesterday.  Eating well.  No chest apin, no dyspnea.  No problems walking to rest room.    OBJECTIVE:         Vital signs in last 24 hours:    Temp:  [98.8 F (37.1 C)-101.2 F (38.4 C)] 98.8 F (37.1 C) (01/04 0500) Pulse Rate:  [88-96] 89 (01/04 0400) Resp:  [21-26] 24 (01/04 0400) BP: (121-142)/(60-66) 128/66 mmHg (01/04 0500) SpO2:  [94 %-98 %] 97 % (01/04 0802) Weight:  [86 kg (189 lb 9.5 oz)] 86 kg (189 lb 9.5 oz) (01/04 0400) Last BM Date: 11/17/15 Filed Weights   11/16/15 0441 11/17/15 0335 11/18/15 0400  Weight: 85.639 kg (188 lb 12.8 oz) 85.684 kg (188 lb 14.4 oz) 86 kg (189 lb 9.5 oz)   General: pleasant, comfortable.     Heart: RRR. Chest: clear bil.  No labored breathing Abdomen: soft, active BS.  ND, NT.    Extremities: slight ankle/pedal edema.   Neuro/Psych:  Oriented x 3, calm, pleasant, moves all 4s, slight UE tremor.     Intake/Output from previous day: 01/03 0701 - 01/04 0700 In: 680 [P.O.:480; IV Piggyback:200] Out: 1400 [Urine:1400]  Intake/Output this shift:    Lab Results:  Recent Labs  11/15/15 1942 11/16/15 0057 11/16/15 1613 11/16/15 2048 11/18/15 0243  WBC 10.5 10.9*  --   --  8.3  HGB 10.2* 9.9* 10.5* 10.8* 9.5*  HCT 30.6* 29.5*  --  32.1* 29.6*  PLT 141* 150  --   --  140*   BMET  Recent Labs  11/16/15 0057 11/17/15 1031 11/18/15 0243  NA 141 141 142  K 4.0 3.6 3.0*  CL 109 109 109  CO2 22 22 24   GLUCOSE 143* 254* 144*  BUN 24* 12 14  CREATININE 2.03* 1.98* 1.83*  CALCIUM 8.3* 8.5* 7.9*   LFT  Recent Labs  11/17/15 1031 11/18/15 0243  ALBUMIN 2.9* 2.1*   PT/INR No results for input(s): LABPROT, INR in the last 72 hours.  Studies/Results: Dg Chest Port 1 View  11/17/2015  CLINICAL DATA:  Acute onset of hypoxia.  Initial encounter. EXAM: PORTABLE  CHEST 1 VIEW COMPARISON:  Chest radiograph from 06/15/2014 FINDINGS: The lungs are well-aerated. Patchy right-sided airspace opacification raises concern for pneumonia. The appearance is less typical for asymmetric pulmonary edema. No definite pleural effusion or pneumothorax is seen, though the right costophrenic angle is incompletely imaged on this study. The cardiomediastinal silhouette is borderline normal in size. The patient is status post median sternotomy, with evidence of prior CABG. No acute osseous abnormalities are seen. Scattered clips are noted at the right side of the neck. IMPRESSION: Patchy right-sided airspace opacification raises concern for pneumonia. The appearance is less typical for asymmetric pulmonary edema. Electronically Signed   By: Garald Balding M.D.   On: 11/17/2015 04:04    Scheduled Meds: . sodium chloride   Intravenous Once  . sodium chloride   Intravenous Once  . sodium chloride   Intravenous Once  . atorvastatin  80 mg Oral q1800  . insulin aspart  0-5 Units Subcutaneous QHS  . insulin aspart  0-9 Units Subcutaneous TID WC  . insulin glargine  10 Units Subcutaneous QHS  . levalbuterol  0.63 mg Nebulization Q6H  . metoprolol  5 mg Intravenous 4 times per  day  . piperacillin-tazobactam (ZOSYN)  IV  3.375 g Intravenous Q8H  . ranolazine  500 mg Oral BID  . sodium chloride  3 mL Intravenous Q12H  . vancomycin  1,250 mg Intravenous Q24H   Continuous Infusions: . nitroGLYCERIN     PRN Meds:.acetaminophen **OR** acetaminophen, metoprolol, morphine injection, nitroGLYCERIN, ondansetron **OR** ondansetron (ZOFRAN) IV, oxyCODONE   ASSESMENT:   * Diverticular bleeding. Resolved.  11/14/15 Colonoscopy, poor prep with retained stool and blood clots. Clips placed to prox descendcolon tic. 12/31 EGD.  Gastritis with small areas of gastric friability and old blood.  11/16/15 Sigmoidoscopy with clipping (3 total) of active oozing in ulcerated diverticulum in prox  descending tic in area of previous clipping. [name]  Has not had nuc RBC scan, with CKD and angiogram carries higher nephrotoxic risk.   * ABL anemia. S/p 4 PRBCs so far. Latest completed 1/4. Hgb down 1.3 grams in last 48 hours.    * Thrombocytopenia. Improved.   *  Hypokalemia.   * PNA right side, vs asymmetric pulmonary edema. Zosyn/Vanc.   * CAD. New MI, Troponins to 13.3. On Brilinta (along with ASA, on hold) with, s/p 05/2014 stent, previous CABGs.  No Heparin etc in place due to GIB.  Chronic exertional angina. 1/3 echo with LVEF 50 to [id number], grade 2 diastolic dysfunction, mild to mod mitral and tricuspid regurge, mod increased pulmonary pressures. .   * CKD, stage 3 to 4. AKI improved. High risk of contrast associated AKI leading to need for short term HD if cathd etc.   *  Hypoalbuminemia.    PLAN   *  PPI for gastritis.  Hospitalist to address potassium supplements.     Azucena Freed  11/18/2015, 8:11 AM Pager: [phone]  GI ATTENDING  Interval history and data reviewed as well as cardiology note from today. Agree with interval progress note as outlined. No further bleeding. Advance diet as tolerated. No additional GI recommendations. Should he re-bleed, would go straight to angiography for selective embolization. We are available if needed. Thanks   Docia Chuck. Geri Seminole., M.D. [facility] Division of Gastroenterology

## 2015-11-19 DIAGNOSIS — I5041 Acute combined systolic (congestive) and diastolic (congestive) heart failure: Secondary | ICD-10-CM

## 2015-11-19 LAB — RENAL FUNCTION PANEL
ALBUMIN: 2.4 g/dL — AB (ref 3.5–5.0)
Anion gap: 8 (ref 5–15)
BUN: 16 mg/dL (ref 6–20)
CHLORIDE: 109 mmol/L (ref 101–111)
CO2: 25 mmol/L (ref 22–32)
CREATININE: 2.04 mg/dL — AB (ref 0.61–1.24)
Calcium: 8.3 mg/dL — ABNORMAL LOW (ref 8.9–10.3)
GFR, EST AFRICAN AMERICAN: 33 mL/min — AB (ref >60)
GFR, EST NON AFRICAN AMERICAN: 29 mL/min — AB (ref >60)
Glucose, Bld: 148 mg/dL — ABNORMAL HIGH (ref 65–99)
PHOSPHORUS: 3.2 mg/dL (ref 2.5–4.6)
Potassium: 3.7 mmol/L (ref 3.5–5.1)
Sodium: 142 mmol/L (ref 135–145)

## 2015-11-19 LAB — CBC
HCT: 30.8 % — ABNORMAL LOW (ref 39.0–52.0)
Hemoglobin: 9.9 g/dL — ABNORMAL LOW (ref 13.0–17.0)
MCH: 29.6 pg (ref 26.0–34.0)
MCHC: 32.1 g/dL (ref 30.0–36.0)
MCV: 92.2 fL (ref 78.0–100.0)
PLATELETS: 165 10*3/uL (ref 150–400)
RBC: 3.34 MIL/uL — AB (ref 4.22–5.81)
RDW: 16.1 % — AB (ref 11.5–15.5)
WBC: 7.6 10*3/uL (ref 4.0–10.5)

## 2015-11-19 LAB — GLUCOSE, CAPILLARY
GLUCOSE-CAPILLARY: 123 mg/dL — AB (ref 65–99)
GLUCOSE-CAPILLARY: 138 mg/dL — AB (ref 65–99)
GLUCOSE-CAPILLARY: 166 mg/dL — AB (ref 65–99)
Glucose-Capillary: 142 mg/dL — ABNORMAL HIGH (ref 65–99)
Glucose-Capillary: 150 mg/dL — ABNORMAL HIGH (ref 65–99)

## 2015-11-19 MED ORDER — METOLAZONE 2.5 MG PO TABS
2.5000 mg | ORAL_TABLET | Freq: Every day | ORAL | Status: DC
  Administered 2015-11-19: 2.5 mg via ORAL
  Filled 2015-11-19: qty 1

## 2015-11-19 MED ORDER — FUROSEMIDE 10 MG/ML IJ SOLN
60.0000 mg | Freq: Two times a day (BID) | INTRAMUSCULAR | Status: DC
  Administered 2015-11-19 (×2): 60 mg via INTRAVENOUS
  Filled 2015-11-19 (×2): qty 6

## 2015-11-19 MED ORDER — AMOXICILLIN-POT CLAVULANATE 875-125 MG PO TABS
1.0000 | ORAL_TABLET | Freq: Two times a day (BID) | ORAL | Status: DC
  Administered 2015-11-19 – 2015-11-20 (×4): 1 via ORAL
  Filled 2015-11-19 (×5): qty 1

## 2015-11-19 MED ORDER — METOLAZONE 2.5 MG PO TABS: 2.5000 mg | ORAL_TABLET | Freq: Every day | ORAL | Status: DC

## 2015-11-19 NOTE — Care Management Important Message (Signed)
Important Message  Patient Details  Name: [name] MRN: [id number] Date of Birth: 1932-01-13   Medicare Important Message Given:  Yes    Tahjay Binion P Jimesha Rising 11/19/2015, 1:36 PM

## 2015-11-19 NOTE — Progress Notes (Signed)
Patient Name: [name] Date of Encounter: 11/19/2015  Principal Problem:   GI bleed Active Problems:   Type 2 diabetes mellitus with peripheral neuropathy ([name])   CAD (coronary artery disease)   Hx of CABG   History of gastrointestinal bleeding   Acute-on-chronic kidney injury ([place])   Symptomatic anemia   Acute GI bleeding   Diverticulosis of colon with hemorrhage   NSTEMI (non-ST elevated myocardial infarction) ([place])   HCAP (healthcare-associated pneumonia)   Hypokalemia   Length of Stay: 6  SUBJECTIVE  "I feel great". No events overnight. I's/O's about even recorded, however, weight is down 1lb. Afebrile overnight.   CURRENT MEDS . sodium chloride   Intravenous Once  . sodium chloride   Intravenous Once  . sodium chloride   Intravenous Once  . antiseptic oral rinse  7 mL Mouth Rinse BID  . aspirin EC  81 mg Oral Daily  . atorvastatin  80 mg Oral q1800  . furosemide  40 mg Intravenous BID  . insulin aspart  0-5 Units Subcutaneous QHS  . insulin aspart  0-9 Units Subcutaneous TID WC  . insulin glargine  10 Units Subcutaneous QHS  . metoprolol  5 mg Intravenous 4 times per day  . pantoprazole  40 mg Oral BID  . [START ON 12/03/2015] pantoprazole  40 mg Oral Daily  . piperacillin-tazobactam (ZOSYN)  IV  3.375 g Intravenous Q8H  . ranolazine  500 mg Oral BID  . sodium chloride  3 mL Intravenous Q12H    OBJECTIVE  Filed Vitals:   11/18/15 2304 11/19/15 0329 11/19/15 0336 11/19/15 0711  BP: 120/57 106/53  122/62  Pulse: 71 66  62  Temp: 98.8 F (37.1 C) 98.8 F (37.1 C)  98.6 F (37 C)  TempSrc: Oral Oral  Oral  Resp: 32 24  27  Height:      Weight:   188 lb 15 oz (85.7 kg)   SpO2: 99% 95%  95%    Intake/Output Summary (Last 24 hours) at 11/19/15 0743 Last data filed at 11/19/15 0429  Gross per 24 hour  Intake   1216 ml  Output   1400 ml  Net   -184 ml   Filed Weights   11/17/15 0335 11/18/15 0400 11/19/15 0336  Weight: 188 lb 14.4 oz (85.684  kg) 189 lb 9.5 oz (86 kg) 188 lb 15 oz (85.7 kg)    PHYSICAL EXAM  General: Pleasant, NAD. Neuro: Alert and oriented X 3. Moves all extremities spontaneously. Psych: Normal affect. HEENT:  Normal  Neck: JVP elevated to 6 cm Lungs:  Decreased breath sounds at the bases Heart: RRR no s3, s4, or murmurs. Abdomen: Soft, non-tender, non-distended, BS + x 4.  Extremities: 1+ bilateral pitting edema  Accessory Clinical Findings  CBC  Recent Labs  11/18/15 0243 11/19/15 0224  WBC 8.3 7.6  HGB 9.5* 9.9*  HCT 29.6* 30.8*  MCV 90.5 92.2  PLT 140* [id number]   Basic Metabolic Panel  Recent Labs  11/18/15 0243 11/19/15 0224  NA 142 142  K 3.0* 3.7  CL 109 109  CO2 24 25  GLUCOSE 144* 148*  BUN 14 16  CREATININE 1.83* 2.04*  CALCIUM 7.9* 8.3*  PHOS 2.9 3.2   Liver Function Tests  Recent Labs  11/18/15 0243 11/19/15 0224  ALBUMIN 2.1* 2.4*   No results for input(s): CKTOTAL, CKMB, CKMBINDEX, TROPONINI in the last 72 hours. TELE: SR, frequent PACs, PVCs    ASSESSMENT AND PLAN  1. GIB - H/H stable overnight p sigmoidoscopy yesterday with clipping of diverticular source.  2. NSTEMI with Chest pain - yesterday 10/10 with new STD in the lateral leads consistent with lateral ischemia,troponin uptrending to 13, he received betablockers, iv NTG, morphine and 2 units of PRBC to increase Hb > 10 g/dL. ECG shows persistent ST depressions in the lateral leads.  He has h/o CAD, History of coronary artery disease status post bypass grafting in 1995 with 3 grafts placed at that time and redo bypass in 2006 with again 3 grafts placed by [name]. He was catheterized by [name] in July 2015 and had a stent placed to the circumflex vein graft.  - now CP post discontinuation of Brilinta, he is Plavix non-responder, we wont start, he is not a candidate for cath right now, as he has recurrent bleeding with antiplatelet therapy and CKD stage 4.   Echo yesterday show anterior WMA. EF  still 50-55%. He denies any angina and has likely completed his infarct. Would recommend starting low dose aspirin. He does not require a more potent antiplatelet for his previously placed stent which was >1 year ago. I'm not comfortable starting a potent antiplatelet agent at this time for his recent NSTEMI due to recent significant bleeding.   3. PAF - history of paroxysmal atrial fibrillation on Coumadin anticoagulation in the past which was discontinued because of GI bleed and the need to be on dual antiplatelet therapy. He has had some brief PAF reported.  Not an ideal long-term anticoagulation candidate. May be a candidate for the Watchman LAA occluder device. This patients CHA2DS2-VASc Score and unadjusted Ischemic Stroke Rate (% per year) is equal to 7.2 % stroke rate/year from a score of 5  Above score calculated as 1 point each if present [CHF, HTN, DM, Vascular=MI/PAD/Aortic Plaque, Age if 65-74, or Male] Above score calculated as 2 points each if present [Age > 75, or Stroke/TIA/TE]  4. HLP - restart atorvastatin 80 mg daily today.  5. HCAP - on vanc/zosyn. Still with fevers overnight. No production with cough. On 2L O2.  6. Acute diastolic congestive heart failure - appears volume overloaded, needs additional diuresis. Add metolazone 2.5 mg and increase lasix to 60 mg IV BID.  7. AKI - increased overnight, but seems up and down. Will continue diuresis.  8. Hypokalemia - K+ 3.7 today. Monitor.  Pixie Casino, MD, [facility] Attending Cardiologist [facility] 11/19/2015

## 2015-11-19 NOTE — Progress Notes (Signed)
Report called to RN and pt. Transported to [id number] via w/c

## 2015-11-19 NOTE — Progress Notes (Signed)
Pt transferred from 2 Heart. Oriented to room. No changes noted from previous assessment. Call bell and phone within reach, will continue to monitor.

## 2015-11-19 NOTE — Progress Notes (Signed)
PROGRESS NOTE  [name] [id number] DOB: 19-Nov-1931 DOA: 11/13/2015 PCP: [name], MD  [name] is a 82 y.o. male with a past medical history of coronary artery disease status post percutaneous intervention in July 2015 with stenting to high grade obtuse marginal branch of vein graft. Patient was felt to be a Plavix nonresponder for which she was treated with Brilinta in addition to aspirin therapy. He also has a history of paroxysmal atrial fibrillation and previously had been anticoagulated with Coumadin which was discontinued due to GI bleed. Hospitalized about 5 years ago for GI bleed undergoing upper and lower endoscopy. At the time he required blood transfusions. It was felt that GI bleed represented diverticular bleed. He presents to the emergency department today with complaints of bright red blood per rectum associate with dark colored stools. Found to have diverticular bleed.  2 clips and injection done.  After procedure had chest pain and elevated troponin.  Sent to SDU where he was stable 1 days and then had a re-bleed.  Had another colonoscopy- unsedated- clipped again  Assessment/Plan: Diverticular GI bleed and gastritis -s/p EGD (gastritis). Dr. Havery Moros rec ppi bid x2 weeks, then daily if on Brilinta. ASA being resumed by cardiology -colonoscopy- bleeding diverticula clipped x 2-- had rebleed with repeat colonoscopy and repeat injection and clipping of same area. If rebleeds --> IR embolization  HCAPNA, possibly aspiration related: No fevers for greater than 36 hours, and very little cough. Will change Zosyn to Augmentin for a few more days then stop.  NSTEMI: cardiology starting low dose ASA. Echo with new anterior WMA. EF 50-55%. Transfer to telemetry. Increase activity. Communicated with Dr. Debara Pickett.  ABLA -s/p 3 unit of PRBC. No further bleeding and hemoglobin stable.  History of diastolic congestive heart failure. EF 50-55%. Diuresing per  cardiology  Hypokalemia: Corrected  PAF CHADSVASC 5, not a candidate for anticoag due to recurrent GIB  CKD 3/4 -monitor -renal signed off  Diabetes mellitus. . Cont SSI  Hypertension.  stable  Code Status: full Family Communication: patient at bedside Disposition Plan: Transfer to telemetry   Consultants:  GI  Cards  nephro  IR  Procedures:  EGD  Colonoscopy with clips, epi x2  HPI/Subjective: Feels great. Minimal cough. No dyspnea. No bleeding.  Objective: Filed Vitals:   11/19/15 0329 11/19/15 0711  BP: 106/53 122/62  Pulse: 66 62  Temp: 98.8 F (37.1 C) 98.6 F (37 C)  Resp: 24 27    Intake/Output Summary (Last 24 hours) at 11/19/15 1023 Last data filed at 11/19/15 0800  Gross per 24 hour  Intake    820 ml  Output   1400 ml  Net   -580 ml   Filed Weights   11/17/15 0335 11/18/15 0400 11/19/15 0336  Weight: 85.684 kg (188 lb 14.4 oz) 86 kg (189 lb 9.5 oz) 85.7 kg (188 lb 15 oz)    Exam:   General:  Sitting on side of bed, comfortable  Cardiovascular: rrr without MGR  Respiratory: CTA without WRR  Abdomen: +BS, soft  Ext: bilateral ankle edema  Data Reviewed: Basic Metabolic Panel:  Recent Labs Lab 11/15/15 1033 11/16/15 0057 11/17/15 1031 11/18/15 0243 11/19/15 0224  NA 141 141 141 142 142  K 3.5 4.0 3.6 3.0* 3.7  CL 109 109 109 109 109  CO2 21* 22 22 24 25   GLUCOSE 119* 143* 254* 144* 148*  BUN 25* 24* 12 14 16   CREATININE 2.06* 2.03* 1.98* 1.83* 2.04*  CALCIUM  8.1* 8.3* 8.5* 7.9* 8.3*  PHOS  --   --  1.5* 2.9 3.2   Liver Function Tests:  Recent Labs Lab 11/13/15 1704 11/17/15 1031 11/18/15 0243 11/19/15 0224  AST 31  --   --   --   ALT 27  --   --   --   ALKPHOS 65  --   --   --   BILITOT 0.6  --   --   --   PROT 6.9  --   --   --   ALBUMIN 3.5 2.9* 2.1* 2.4*   No results for input(s): LIPASE, AMYLASE in the last 168 hours. No results for input(s): AMMONIA in the last 168 hours. CBC:  Recent  Labs Lab 11/13/15 1414  11/15/15 1000 11/15/15 1942 11/16/15 0057 11/16/15 1613 11/16/15 2048 11/18/15 0243 11/19/15 0224  WBC 8.4  < > 10.0 10.5 10.9*  --   --  8.3 7.6  NEUTROABS 6.1  --   --   --   --   --   --   --   --   HGB 9.2*  < > 10.7* 10.2* 9.9* 10.5* 10.8* 9.5* 9.9*  HCT 27.6*  < > 32.9* 30.6* 29.5*  --  32.1* 29.6* 30.8*  MCV 91.7  < > 91.6 91.1 90.5  --   --  90.5 92.2  PLT 178  < > 152 141* 150  --   --  140* 165  < > = values in this interval not displayed. Cardiac Enzymes:  Recent Labs Lab 11/14/15 1248 11/14/15 2209 11/15/15 0436  TROPONINI 0.03 5.67* 13.30*   BNP (last 3 results) No results for input(s): BNP in the last 8760 hours.  ProBNP (last 3 results) No results for input(s): PROBNP in the last 8760 hours.  CBG:  Recent Labs Lab 11/18/15 0838 11/18/15 1233 11/18/15 1648 11/18/15 2118 11/19/15 0722  GLUCAP 124* 190* 118* 123* 138*    Recent Results (from the past 240 hour(s))  MRSA PCR Screening     Status: None   Collection Time: 11/14/15  5:12 PM  Result Value Ref Range Status   MRSA by PCR NEGATIVE NEGATIVE Final    Comment:        The GeneXpert MRSA Assay (FDA approved for NASAL specimens only), is one component of a comprehensive MRSA colonization surveillance program. It is not intended to diagnose MRSA infection nor to guide or monitor treatment for MRSA infections.      Studies: No results found.  Scheduled Meds: . sodium chloride   Intravenous Once  . sodium chloride   Intravenous Once  . sodium chloride   Intravenous Once  . antiseptic oral rinse  7 mL Mouth Rinse BID  . aspirin EC  81 mg Oral Daily  . atorvastatin  80 mg Oral q1800  . furosemide  60 mg Intravenous BID  . insulin aspart  0-5 Units Subcutaneous QHS  . insulin aspart  0-9 Units Subcutaneous TID WC  . insulin glargine  10 Units Subcutaneous QHS  . metolazone  2.5 mg Oral Daily  . metoprolol  5 mg Intravenous 4 times per day  . pantoprazole  40  mg Oral BID  . [START ON 12/03/2015] pantoprazole  40 mg Oral Daily  . piperacillin-tazobactam (ZOSYN)  IV  3.375 g Intravenous Q8H  . ranolazine  500 mg Oral BID  . sodium chloride  3 mL Intravenous Q12H   Continuous Infusions: . nitroGLYCERIN     Antibiotics Given (  last 72 hours)    Date/Time Action Medication Dose Rate   11/17/15 0454 Given   vancomycin (VANCOCIN) 2,000 mg in sodium chloride 0.9 % 500 mL IVPB 2,000 mg 250 mL/hr   11/17/15 0740 Given   piperacillin-tazobactam (ZOSYN) IVPB 3.375 g 3.375 g 12.5 mL/hr   11/17/15 2044 Given   piperacillin-tazobactam (ZOSYN) IVPB 3.375 g 3.375 g 12.5 mL/hr   11/18/15 0411 Given   piperacillin-tazobactam (ZOSYN) IVPB 3.375 g 3.375 g 12.5 mL/hr   11/18/15 [id number] Given   vancomycin (VANCOCIN) 1,250 mg in sodium chloride 0.9 % 250 mL IVPB 1,250 mg 166.7 mL/hr   11/18/15 1241 Given   piperacillin-tazobactam (ZOSYN) IVPB 3.375 g 3.375 g 12.5 mL/hr   11/18/15 2132 Given   piperacillin-tazobactam (ZOSYN) IVPB 3.375 g 3.375 g 12.5 mL/hr   11/19/15 0429 Given   piperacillin-tazobactam (ZOSYN) IVPB 3.375 g 3.375 g 12.5 mL/hr      Time spent: 30 min  Simpson L  [facility]  [email or web address], password [facility] 11/19/2015, 10:23 AM  LOS: 6 days

## 2015-11-20 ENCOUNTER — Encounter (HOSPITAL_COMMUNITY): Payer: Self-pay | Admitting: Internal Medicine

## 2015-11-20 DIAGNOSIS — I5033 Acute on chronic diastolic (congestive) heart failure: Secondary | ICD-10-CM | POA: Diagnosis not present

## 2015-11-20 DIAGNOSIS — I5020 Unspecified systolic (congestive) heart failure: Secondary | ICD-10-CM | POA: Diagnosis not present

## 2015-11-20 DIAGNOSIS — K5793 Diverticulitis of intestine, part unspecified, without perforation or abscess with bleeding: Secondary | ICD-10-CM

## 2015-11-20 DIAGNOSIS — E1142 Type 2 diabetes mellitus with diabetic polyneuropathy: Secondary | ICD-10-CM

## 2015-11-20 LAB — GLUCOSE, CAPILLARY
GLUCOSE-CAPILLARY: 131 mg/dL — AB (ref 65–99)
Glucose-Capillary: 223 mg/dL — ABNORMAL HIGH (ref 65–99)
Glucose-Capillary: 154 mg/dL — ABNORMAL HIGH (ref 65–99)
Glucose-Capillary: 121 mg/dL — ABNORMAL HIGH (ref 65–99)

## 2015-11-20 LAB — CBC
HCT: 32.3 % — ABNORMAL LOW (ref 39.0–52.0)
HEMOGLOBIN: 10.5 g/dL — AB (ref 13.0–17.0)
MCH: 29.8 pg (ref 26.0–34.0)
MCHC: 32.5 g/dL (ref 30.0–36.0)
MCV: 91.8 fL (ref 78.0–100.0)
Platelets: 191 10*3/uL (ref 150–400)
RBC: 3.52 MIL/uL — AB (ref 4.22–5.81)
RDW: 15.8 % — ABNORMAL HIGH (ref 11.5–15.5)
WBC: 6.7 10*3/uL (ref 4.0–10.5)

## 2015-11-20 LAB — BASIC METABOLIC PANEL
ANION GAP: 12 (ref 5–15)
BUN: 19 mg/dL (ref 6–20)
CALCIUM: 9.1 mg/dL (ref 8.9–10.3)
CHLORIDE: 99 mmol/L — AB (ref 101–111)
CO2: 29 mmol/L (ref 22–32)
CREATININE: 2.2 mg/dL — AB (ref 0.61–1.24)
GFR calc non Af Amer: 26 mL/min — ABNORMAL LOW (ref >60)
GFR, EST AFRICAN AMERICAN: 31 mL/min — AB (ref >60)
GLUCOSE: 133 mg/dL — AB (ref 65–99)
Potassium: 3.4 mmol/L — ABNORMAL LOW (ref 3.5–5.1)
Sodium: 140 mmol/L (ref 135–145)

## 2015-11-20 MED ORDER — FUROSEMIDE 80 MG PO TABS
80.0000 mg | ORAL_TABLET | Freq: Every day | ORAL | Status: DC
  Administered 2015-11-21: 80 mg via ORAL
  Filled 2015-11-20: qty 1

## 2015-11-20 MED ORDER — POTASSIUM CHLORIDE CRYS ER 20 MEQ PO TBCR
40.0000 meq | EXTENDED_RELEASE_TABLET | Freq: Once | ORAL | Status: AC
  Administered 2015-11-20: 40 meq via ORAL
  Filled 2015-11-20: qty 2

## 2015-11-20 MED ORDER — METOPROLOL TARTRATE 12.5 MG HALF TABLET
12.5000 mg | ORAL_TABLET | Freq: Two times a day (BID) | ORAL | Status: DC
  Administered 2015-11-20 – 2015-11-21 (×2): 12.5 mg via ORAL
  Filled 2015-11-20 (×2): qty 1

## 2015-11-20 NOTE — Progress Notes (Signed)
Patient Name: [name] Date of Encounter: 11/20/2015  Principal Problem:   GI bleed Active Problems:   Type 2 diabetes mellitus with peripheral neuropathy ([place])   CAD (coronary artery disease)   Hx of CABG   History of gastrointestinal bleeding   Acute-on-chronic kidney injury ([place])   Symptomatic anemia   Acute GI bleeding   Diverticulosis of colon with hemorrhage   NSTEMI (non-ST elevated myocardial infarction) ([place])   HCAP (healthcare-associated pneumonia)   Hypokalemia   Length of Stay: 7  SUBJECTIVE  Net negative only -605 yesterday (recorded), however, weight is down to 181 (From 188). Creatinine has increased up to 2.2 today. Potassium is low at 3.4.   CURRENT MEDS . amoxicillin-clavulanate  1 tablet Oral Q12H  . antiseptic oral rinse  7 mL Mouth Rinse BID  . aspirin EC  81 mg Oral Daily  . atorvastatin  80 mg Oral q1800  . furosemide  60 mg Intravenous BID  . insulin aspart  0-5 Units Subcutaneous QHS  . insulin aspart  0-9 Units Subcutaneous TID WC  . insulin glargine  10 Units Subcutaneous QHS  . metolazone  2.5 mg Oral Daily  . metoprolol  5 mg Intravenous 4 times per day  . pantoprazole  40 mg Oral BID  . [START ON 12/03/2015] pantoprazole  40 mg Oral Daily  . ranolazine  500 mg Oral BID  . sodium chloride  3 mL Intravenous Q12H    OBJECTIVE  Filed Vitals:   11/19/15 1714 11/19/15 2213 11/19/15 2322 11/20/15 0414  BP: 143/60 132/53 122/59 106/45  Pulse: 67 60 70 64  Temp: 98.5 F (36.9 C) 97.8 F (36.6 C)  98.2 F (36.8 C)  TempSrc: Oral Oral  Oral  Resp: 18 18  18   Height:      Weight:    181 lb 11.2 oz (82.419 kg)  SpO2: 98% 100%  98%    Intake/Output Summary (Last 24 hours) at 11/20/15 0748 Last data filed at 11/20/15 0715  Gross per 24 hour  Intake    520 ml  Output   1125 ml  Net   -605 ml   Filed Weights   11/18/15 0400 11/19/15 0336 11/20/15 0414  Weight: 189 lb 9.5 oz (86 kg) 188 lb 15 oz (85.7 kg) 181 lb 11.2 oz (82.419  kg)    PHYSICAL EXAM  General: Pleasant, NAD. Neuro: Alert and oriented X 3. Moves all extremities spontaneously. Psych: Normal affect. HEENT:  Normal  Neck: JVP flat Lungs:  Decreased breath sounds at the bases Heart: RRR no s3, s4, or murmurs. Abdomen: Soft, non-tender, non-distended, BS + x 4.  Extremities: trace bilateral pitting edema  Accessory Clinical Findings  CBC  Recent Labs  11/19/15 0224 11/20/15 0407  WBC 7.6 6.7  HGB 9.9* 10.5*  HCT 30.8* 32.3*  MCV 92.2 91.8  PLT 165 [id number]   Basic Metabolic Panel  Recent Labs  11/18/15 0243 11/19/15 0224 11/20/15 0407  NA 142 142 140  K 3.0* 3.7 3.4*  CL 109 109 99*  CO2 24 25 29   GLUCOSE 144* 148* 133*  BUN 14 16 19   CREATININE 1.83* 2.04* 2.20*  CALCIUM 7.9* 8.3* 9.1  PHOS 2.9 3.2  --    Liver Function Tests  Recent Labs  11/18/15 0243 11/19/15 0224  ALBUMIN 2.1* 2.4*   No results for input(s): CKTOTAL, CKMB, CKMBINDEX, TROPONINI in the last 72 hours. TELE: SR, frequent PACs, PVCs    ASSESSMENT AND PLAN  1. GIB - H/H stable overnight p sigmoidoscopy yesterday with clipping of diverticular source.  2. NSTEMI with Chest pain - yesterday 10/10 with new STD in the lateral leads consistent with lateral ischemia,troponin uptrending to 13, he received betablockers, iv NTG, morphine and 2 units of PRBC to increase Hb > 10 g/dL. ECG shows persistent ST depressions in the lateral leads.  He has h/o CAD, History of coronary artery disease status post bypass grafting in 1995 with 3 grafts placed at that time and redo bypass in 2006 with again 3 grafts placed by [name]. He was catheterized by [name] in July 2015 and had a stent placed to the circumflex vein graft.  - now CP post discontinuation of Brilinta, he is Plavix non-responder, we wont start, he is not a candidate for cath right now, as he has recurrent bleeding with antiplatelet therapy and CKD stage 4.   Echo yesterday show anterior WMA. EF  still 50-55%. He denies any angina and has likely completed his infarct. Would recommend starting low dose aspirin. He does not require a more potent antiplatelet for his previously placed stent which was >1 year ago. I'm not comfortable starting a potent antiplatelet agent at this time for his recent NSTEMI due to recent significant bleeding.   3. PAF - history of paroxysmal atrial fibrillation on Coumadin anticoagulation in the past which was discontinued because of GI bleed and the need to be on dual antiplatelet therapy. He has had some brief PAF reported.  Not an ideal long-term anticoagulation candidate. May be a candidate for the Watchman LAA occluder device. This patients CHA2DS2-VASc Score and unadjusted Ischemic Stroke Rate (% per year) is equal to 7.2 % stroke rate/year from a score of 5  Above score calculated as 1 point each if present [CHF, HTN, DM, Vascular=MI/PAD/Aortic Plaque, Age if 65-74, or Male] Above score calculated as 2 points each if present [Age > 75, or Stroke/TIA/TE]  4. HLP - on atorvastatin 80 mg daily.  5. HCAP - on vanc/zosyn. No fevers, cough has resolved. Off O2.  6. Acute diastolic congestive heart failure - Diuresed very well yesterday, however, I's/O's are not accurate. Weight, however, indicates he is down 7 additional pounds. Creatinine has risen today. Hold diuretics today. Restart home lasix tomorrow morning.  7. AKI - increased overnight, but seems up and down. Will continue diuresis.  8. Hypokalemia - Replete today due to aggressive diuresis.  Close to discharge - probably tomorrow from my standpoint.  Pixie Casino, MD, [facility] Attending Cardiologist [facility] 11/20/2015

## 2015-11-20 NOTE — Progress Notes (Signed)
PROGRESS NOTE  [name] [id number] DOB: 23-Apr-1932 DOA: 11/13/2015 PCP: [name], MD  [name] is a 82 y.o. male with a past medical history of coronary artery disease status post percutaneous intervention in July 2015 with stenting to high grade obtuse marginal branch of vein graft. Patient was felt to be a Plavix nonresponder for which she was treated with Brilinta in addition to aspirin therapy. He also has a history of paroxysmal atrial fibrillation and previously had been anticoagulated with Coumadin which was discontinued due to GI bleed. Hospitalized about 5 years ago for GI bleed undergoing upper and lower endoscopy. At the time he required blood transfusions. It was felt that GI bleed represented diverticular bleed. He presents to the emergency department today with complaints of bright red blood per rectum associate with dark colored stools. Found to have diverticular bleed.  2 clips and injection done.  After procedure had chest pain and elevated troponin.  Sent to SDU where he was stable 1 days and then had a re-bleed.  Had another colonoscopy- unsedated- clipped again  Assessment/Plan: Diverticular GI bleed and gastritis -s/p EGD (gastritis). Dr. Havery Moros rec ppi bid x2 weeks, then daily if on Brilinta. ASA being resumed by cardiology -colonoscopy- bleeding diverticula clipped x 2-- had rebleed with repeat colonoscopy and repeat injection and clipping of same area. If rebleeds --> IR embolization  HCAPNA, possibly aspiration related: No fevers for 72 hours and no cough. Will d/c abx and monitor  NSTEMI: cardiology starting low dose ASA. Echo with new anterior WMA. EF 50-55%.   ABLA -s/p 3 unit of PRBC. No further bleeding and hemoglobin stable.  History of diastolic congestive heart failure. EF 50-55%. Creatinine increased, so backing off lasix  Hypokalemia: Corrected  PAF CHADSVASC 5, not a candidate for anticoag due to recurrent GIB  CKD  3/4 -monitor -renal signed off  Diabetes mellitus. . Cont SSI  Hypertension.  stable  Code Status: full Family Communication: patient at bedside Disposition Plan: Transfer to telemetry   Consultants:  GI  Cards  nephro  IR  Procedures:  EGD  Colonoscopy with clips, epi x2  HPI/Subjective: Feels great. no cough. No dyspnea. No bleeding. No CP  Objective: Filed Vitals:   11/20/15 0414 11/20/15 1251  BP: 106/45 131/58  Pulse: 64 77  Temp: 98.2 F (36.8 C) 98.4 F (36.9 C)  Resp: 18 18    Intake/Output Summary (Last 24 hours) at 11/20/15 1506 Last data filed at 11/20/15 1258  Gross per 24 hour  Intake    480 ml  Output      0 ml  Net    480 ml   Filed Weights   11/18/15 0400 11/19/15 0336 11/20/15 0414  Weight: 86 kg (189 lb 9.5 oz) 85.7 kg (188 lb 15 oz) 82.419 kg (181 lb 11.2 oz)    Exam:   General:  Sitting in chair, comfortable  Cardiovascular: rrr without MGR  Respiratory: CTA without WRR  Abdomen: +BS, soft  Ext: bilateral ankle edema  Data Reviewed: Basic Metabolic Panel:  Recent Labs Lab 11/16/15 0057 11/17/15 1031 11/18/15 0243 11/19/15 0224 11/20/15 0407  NA 141 141 142 142 140  K 4.0 3.6 3.0* 3.7 3.4*  CL 109 109 109 109 99*  CO2 22 22 24 25 29   GLUCOSE 143* 254* 144* 148* 133*  BUN 24* 12 14 16 19   CREATININE 2.03* 1.98* 1.83* 2.04* 2.20*  CALCIUM 8.3* 8.5* 7.9* 8.3* 9.1  PHOS  --  1.5*  2.9 3.2  --    Liver Function Tests:  Recent Labs Lab 11/13/15 1704 11/17/15 1031 11/18/15 0243 11/19/15 0224  AST 31  --   --   --   ALT 27  --   --   --   ALKPHOS 65  --   --   --   BILITOT 0.6  --   --   --   PROT 6.9  --   --   --   ALBUMIN 3.5 2.9* 2.1* 2.4*   No results for input(s): LIPASE, AMYLASE in the last 168 hours. No results for input(s): AMMONIA in the last 168 hours. CBC:  Recent Labs Lab 11/15/15 1942 11/16/15 0057 11/16/15 1613 11/16/15 2048 11/18/15 0243 11/19/15 0224 11/20/15 0407  WBC 10.5  10.9*  --   --  8.3 7.6 6.7  HGB 10.2* 9.9* 10.5* 10.8* 9.5* 9.9* 10.5*  HCT 30.6* 29.5*  --  32.1* 29.6* 30.8* 32.3*  MCV 91.1 90.5  --   --  90.5 92.2 91.8  PLT 141* 150  --   --  140* 165 191   Cardiac Enzymes:  Recent Labs Lab 11/14/15 1248 11/14/15 2209 11/15/15 0436  TROPONINI 0.03 5.67* 13.30*   BNP (last 3 results) No results for input(s): BNP in the last 8760 hours.  ProBNP (last 3 results) No results for input(s): PROBNP in the last 8760 hours.  CBG:  Recent Labs Lab 11/19/15 1216 11/19/15 1538 11/19/15 2109 11/20/15 0610 11/20/15 1107  GLUCAP 166* 142* 150* 131* 223*    Recent Results (from the past 240 hour(s))  MRSA PCR Screening     Status: None   Collection Time: 11/14/15  5:12 PM  Result Value Ref Range Status   MRSA by PCR NEGATIVE NEGATIVE Final    Comment:        The GeneXpert MRSA Assay (FDA approved for NASAL specimens only), is one component of a comprehensive MRSA colonization surveillance program. It is not intended to diagnose MRSA infection nor to guide or monitor treatment for MRSA infections.      Studies: No results found.  Scheduled Meds: . amoxicillin-clavulanate  1 tablet Oral Q12H  . antiseptic oral rinse  7 mL Mouth Rinse BID  . aspirin EC  81 mg Oral Daily  . atorvastatin  80 mg Oral q1800  . [START ON 11/21/2015] furosemide  80 mg Oral Daily  . insulin aspart  0-5 Units Subcutaneous QHS  . insulin aspart  0-9 Units Subcutaneous TID WC  . insulin glargine  10 Units Subcutaneous QHS  . metoprolol  5 mg Intravenous 4 times per day  . pantoprazole  40 mg Oral BID  . [START ON 12/03/2015] pantoprazole  40 mg Oral Daily  . ranolazine  500 mg Oral BID  . sodium chloride  3 mL Intravenous Q12H   Continuous Infusions: . nitroGLYCERIN     Antibiotics Given (last 72 hours)    Date/Time Action Medication Dose Rate   11/17/15 2044 Given   piperacillin-tazobactam (ZOSYN) IVPB 3.375 g 3.375 g 12.5 mL/hr   11/18/15 0411  Given   piperacillin-tazobactam (ZOSYN) IVPB 3.375 g 3.375 g 12.5 mL/hr   11/18/15 [id number] Given   vancomycin (VANCOCIN) 1,250 mg in sodium chloride 0.9 % 250 mL IVPB 1,250 mg 166.7 mL/hr   11/18/15 1241 Given   piperacillin-tazobactam (ZOSYN) IVPB 3.375 g 3.375 g 12.5 mL/hr   11/18/15 2132 Given   piperacillin-tazobactam (ZOSYN) IVPB 3.375 g 3.375 g 12.5 mL/hr   11/19/15  [id number] Given   piperacillin-tazobactam (ZOSYN) IVPB 3.375 g 3.375 g 12.5 mL/hr   11/19/15 1143 Given   amoxicillin-clavulanate (AUGMENTIN) 875-125 MG per tablet 1 tablet 1 tablet    11/19/15 2207 Given   amoxicillin-clavulanate (AUGMENTIN) 875-125 MG per tablet 1 tablet 1 tablet    11/20/15 0903 Given   amoxicillin-clavulanate (AUGMENTIN) 875-125 MG per tablet 1 tablet 1 tablet       Time spent: 15 min  [facility]  [email or web address], password [email or web address] 11/20/2015, 3:06 PM  LOS: 7 days

## 2015-11-21 LAB — BASIC METABOLIC PANEL
ANION GAP: 15 (ref 5–15)
BUN: 25 mg/dL — ABNORMAL HIGH (ref 6–20)
CALCIUM: 9.4 mg/dL (ref 8.9–10.3)
CO2: 23 mmol/L (ref 22–32)
Chloride: 101 mmol/L (ref 101–111)
Creatinine, Ser: 2.03 mg/dL — ABNORMAL HIGH (ref 0.61–1.24)
GFR, EST AFRICAN AMERICAN: 34 mL/min — AB (ref >60)
GFR, EST NON AFRICAN AMERICAN: 29 mL/min — AB (ref >60)
Glucose, Bld: 160 mg/dL — ABNORMAL HIGH (ref 65–99)
POTASSIUM: 4.1 mmol/L (ref 3.5–5.1)
SODIUM: 139 mmol/L (ref 135–145)

## 2015-11-21 LAB — GLUCOSE, CAPILLARY
GLUCOSE-CAPILLARY: 123 mg/dL — AB (ref 65–99)
GLUCOSE-CAPILLARY: 167 mg/dL — AB (ref 65–99)

## 2015-11-21 MED ORDER — PANTOPRAZOLE SODIUM 40 MG PO TBEC: 40.0000 mg | DELAYED_RELEASE_TABLET | Freq: Every day | ORAL | Status: DC

## 2015-11-21 NOTE — Progress Notes (Signed)
Subjective:  No recurrent GI bleeding and no chest pain since yesterday. Objective:  Vital Signs in the last 24 hours: BP 124/63 mmHg  Pulse 65  Temp(Src) 98.2 F (36.8 C) (Oral)  Resp 17  Ht 5\' 9"  (1.753 m)  Wt 81.92 kg (180 lb 9.6 oz)  BMI 26.66 kg/m2  SpO2 100%  Physical Exam: Pleasant elderly male in no acute distress Lungs:  Clear Cardiac:  Regular rhythm, normal S1 and S2, no S3 Extremities:  No edema present  Intake/Output from previous day: 01/06 0701 - 01/07 0700 In: 480 [P.O.:480] Out: -   Weight Filed Weights   11/19/15 0336 11/20/15 0414 11/21/15 0626  Weight: 85.7 kg (188 lb 15 oz) 82.419 kg (181 lb 11.2 oz) 81.92 kg (180 lb 9.6 oz)    Lab Results: Basic Metabolic Panel:  Recent Labs  11/20/15 0407 11/21/15 0837  NA 140 139  K 3.4* 4.1  CL 99* 101  CO2 29 23  GLUCOSE 133* 160*  BUN 19 25*  CREATININE 2.20* 2.03*   CBC:  Recent Labs  11/19/15 0224 11/20/15 0407  WBC 7.6 6.7  HGB 9.9* 10.5*  HCT 30.8* 32.3*  MCV 92.2 91.8  PLT 165 191   Telemetry: Normal sinus rhythm  Assessment/Plan:  1.  Coronary artery disease with previous non-STEMI and stent-poor candidate for intervention at the present time due to recent bleeding as well as chronic kidney disease 2.  Recent diverticular bleed  3.  Stage IV chronic kidney disease [place] 4.  Paroxysmal atrial fibrillation but not candidate for anticoagulation due to recurrent GI bleed  Recommendations:  Hemoglobin is stable and he would like to go home this morning.  Okay to go home on low-dose aspirin but should discontinue BRILINTA on discharge.  Keep his regular appointment with Dr. Gwenlyn Found on the 18th.   Kerry Hough  MD [facility]  11/21/2015, 10:36 AM

## 2015-11-21 NOTE — Discharge Summary (Signed)
Physician Discharge Summary  [name] [id number] DOB: 11-12-1932 DOA: 11/13/2015  PCP: [name], MD  Admit date: 11/13/2015 Discharge date: 11/21/2015  Time spent: greater than 30 minutes  Recommendations for Outpatient Follow-up:  1. Monitor H/H 2. Monitor BMET   Discharge Diagnoses:  Principal Problem:   GI bleed Active Problems:   Type 2 diabetes mellitus with peripheral neuropathy ([name])   CAD (coronary artery disease)   Hx of CABG   History of gastrointestinal bleeding   Acute-on-chronic kidney injury ([place])   Symptomatic anemia   Acute GI bleeding   Diverticulosis of colon with hemorrhage   NSTEMI (non-ST elevated myocardial infarction) ([place])   HCAP (healthcare-associated pneumonia)   Hypokalemia   Acute on chronic diastolic (congestive) heart failure ([place])   Discharge Condition: stable  Diet recommendation: carbohydrate modified heart healthy  Filed Weights   11/19/15 0336 11/20/15 0414 11/21/15 0626  Weight: 85.7 kg (188 lb 15 oz) 82.419 kg (181 lb 11.2 oz) 81.92 kg (180 lb 9.6 oz)    History of present illness/Hospital Course:  82 y.o. male with a past medical history of coronary artery disease status post percutaneous intervention in July 2015 with stenting to high grade obtuse marginal branch of vein graft. Patient was felt to be a Plavix nonresponder for which she was treated with Brilinta in addition to aspirin therapy. He also has a history of paroxysmal atrial fibrillation and previously had been anticoagulated with Coumadin which was discontinued due to GI bleed. Hospitalized about 5 years ago for GI bleed undergoing upper and lower endoscopy. At the time he required blood transfusions. It was felt that GI bleed represented diverticular bleed. He presents to the emergency department today with complaints of bright red blood per rectum associate with dark colored stools. Found to have diverticular bleed. 2 clips and injection done. After procedure  had chest pain and elevated troponin. Sent to SDU where he was stable 1 days and then had a re-bleed. Had another colonoscopy- unsedated- clipped again  Diverticular GI bleed and gastritis -s/p EGD (gastritis). Dr. Havery Moros rec ppi bid x2 weeks, then daily if on Brilinta. ASA being resumed by cardiology -colonoscopy- bleeding diverticula clipped x 2-- had rebleed with repeat colonoscopy and repeat injection and clipping of same area. If rebleeds --> IR embolization  HCAPNA, possibly aspiration related: had fever and new infiltrate during hospitalization. Minimal cough. Started on vancomycin and zosyn. Quickly narrowed and subsequently stopped due to lack of continued symptoms  NSTEMI: had chest pain and EKG changes after first endoscopy. Also positive troponins. not a candidate for cath/intervention at this time due to GI bleed. cardiology started low dose ASA after second colonoscopy/clips, without subsequent bleeding. Echo with new anterior WMA. EF 50-55%.   ABLA -s/p 3 unit of PRBC. No further bleeding and hemoglobin stable.  History of diastolic congestive heart failure. EF 50-55%. Diuresed by cardiology.  Hypokalemia: Corrected  PAF CHADSVASC 5, not a candidate for anticoag due to recurrent GIB  CKD 3/4 Nephrology consulted in case angiography needed. Creatinine increased with diuresed, then stabilized  Diabetes mellitus. . Remained controlled  Hypertension.  stable   Consultants:  GI  Cardiology  nephro  IR  Procedures:  EGD  Colonoscopy with clips, epi x2  Discharge Exam: Filed Vitals:   11/20/15 2246 11/21/15 0626  BP: 140/61 124/63  Pulse: 71 65  Temp: 98 F (36.7 C) 98.2 F (36.8 C)  Resp: 16 17    General: a and o Cardiovascular: RRR Respiratory: CTA  Ext trace ankle edema  Discharge Instructions   Discharge Instructions    Activity as tolerated - No restrictions    Complete by:  As directed      Diet - low sodium heart healthy     Complete by:  As directed      Discharge instructions    Complete by:  As directed   Stop brilinta and amlodipine          Current Discharge Medication List    START taking these medications   Details  pantoprazole (PROTONIX) 40 MG tablet Take 1 tablet (40 mg total) by mouth daily. Qty: 30 tablet, Refills: 0      CONTINUE these medications which have NOT CHANGED   Details  aspirin EC 81 MG EC tablet Take 1 tablet (81 mg total) by mouth daily.    Cholecalciferol (VITAMIN D) 1000 UNITS capsule Take 1,000 Units by mouth daily.     colchicine 0.6 MG tablet Take 0.6 mg by mouth daily as needed (glout flare up).     fish oil-omega-3 fatty acids 1000 MG capsule Take 1 g by mouth daily.     furosemide (LASIX) 80 MG tablet TAKE 1 TABLET DAILY Qty: 90 tablet, Refills: 3    insulin glargine (LANTUS) 100 UNIT/ML injection INJECT 25 UNITS UNDER THE SKIN DAILY AS INSTRUCTED BY DOCTOR    isosorbide mononitrate (IMDUR) 30 MG 24 hr tablet TAKE 2 TABLETS TWICE A DAY Qty: 360 tablet, Refills: 1    magnesium oxide (MAG-OX) 400 MG tablet Take 400 mg by mouth daily.     Multiple Vitamin (MULTIVITAMIN WITH MINERALS) TABS Take 1 tablet by mouth daily.    nitroGLYCERIN (NITROSTAT) 0.4 MG SL tablet Place 1 tablet (0.4 mg total) under the tongue every 5 (five) minutes as needed for chest pain. Qty: 100 tablet, Refills: 1    Potassium Gluconate (K-99) 595 MG CAPS Take 595 mg by mouth daily.     ranolazine (RANEXA) 500 MG 12 hr tablet Take 1 tablet (500 mg total) by mouth 2 (two) times daily. Qty: 90 tablet, Refills: 3   Associated Diagnoses: Bilateral carotid artery disease ([name])    TOPROL XL 50 MG 24 hr tablet TAKE 1 TABLET DAILY WITH A MEAL OR IMMEDIATELY FOLLOWING A MEAL Qty: 90 tablet, Refills: 3    traMADol (ULTRAM) 50 MG tablet Take 1 tablet (50 mg total) by mouth every 6 (six) hours as needed. ARTHRITIS Qty: 360 tablet, Refills: 1    Blood Glucose Monitoring Suppl (FREESTYLE LITE)  DEVI Use to check blood sugar once daily and as needed.  Diagnosis:  E11.40  Insulin dependent. Qty: 1 each, Refills: 0    glucose blood (FREESTYLE LITE) test strip Use as instructed to test blood sugar once daily and as needed.  Diagnosis:  E11.40  Insulin Dependent. Qty: 100 each, Refills: 3    Insulin Syringe-Needle U-100 (INSULIN SYRINGE .3CC/31GX5/16") 31G X 5/16" 0.3 ML MISC Use daily as instructed. Qty: 100 each, Refills: 3    rosuvastatin (CRESTOR) 20 MG tablet Take 1 tablet (20 mg total) by mouth daily. Qty: 90 tablet, Refills: 1      STOP taking these medications     amLODipine (NORVASC) 10 MG tablet      BRILINTA 90 MG TABS tablet        No Known Allergies Follow-up Information    Follow up with [name], MD.   Specialty:  Family Medicine   Why:  to check hemoglobin and kidney  function   Contact information:   [place] [phone]        The results of significant diagnostics from this hospitalization (including imaging, microbiology, ancillary and laboratory) are listed below for reference.    Significant Diagnostic Studies: Dg Chest Port 1 View  11/17/2015  CLINICAL DATA:  Acute onset of hypoxia.  Initial encounter. EXAM: PORTABLE CHEST 1 VIEW COMPARISON:  Chest radiograph from 06/15/2014 FINDINGS: The lungs are well-aerated. Patchy right-sided airspace opacification raises concern for pneumonia. The appearance is less typical for asymmetric pulmonary edema. No definite pleural effusion or pneumothorax is seen, though the right costophrenic angle is incompletely imaged on this study. The cardiomediastinal silhouette is borderline normal in size. The patient is status post median sternotomy, with evidence of prior CABG. No acute osseous abnormalities are seen. Scattered clips are noted at the right side of the neck. IMPRESSION: Patchy right-sided airspace opacification raises concern for pneumonia. The appearance is less  typical for asymmetric pulmonary edema. Electronically Signed   By: Garald Balding M.D.   On: 11/17/2015 04:04    Microbiology: Recent Results (from the past 240 hour(s))  MRSA PCR Screening     Status: None   Collection Time: 11/14/15  5:12 PM  Result Value Ref Range Status   MRSA by PCR NEGATIVE NEGATIVE Final    Comment:        The GeneXpert MRSA Assay (FDA approved for NASAL specimens only), is one component of a comprehensive MRSA colonization surveillance program. It is not intended to diagnose MRSA infection nor to guide or monitor treatment for MRSA infections.      Labs: Basic Metabolic Panel:  Recent Labs Lab 11/17/15 1031 11/18/15 0243 11/19/15 0224 11/20/15 0407 11/21/15 0837  NA 141 142 142 140 139  K 3.6 3.0* 3.7 3.4* 4.1  CL 109 109 109 99* 101  CO2 22 24 25 29 23   GLUCOSE 254* 144* 148* 133* 160*  BUN 12 14 16 19  25*  CREATININE 1.98* 1.83* 2.04* 2.20* 2.03*  CALCIUM 8.5* 7.9* 8.3* 9.1 9.4  PHOS 1.5* 2.9 3.2  --   --    Liver Function Tests:  Recent Labs Lab 11/17/15 1031 11/18/15 0243 11/19/15 0224  ALBUMIN 2.9* 2.1* 2.4*   No results for input(s): LIPASE, AMYLASE in the last 168 hours. No results for input(s): AMMONIA in the last 168 hours. CBC:  Recent Labs Lab 11/15/15 1942 11/16/15 0057 11/16/15 1613 11/16/15 2048 11/18/15 0243 11/19/15 0224 11/20/15 0407  WBC 10.5 10.9*  --   --  8.3 7.6 6.7  HGB 10.2* 9.9* 10.5* 10.8* 9.5* 9.9* 10.5*  HCT 30.6* 29.5*  --  32.1* 29.6* 30.8* 32.3*  MCV 91.1 90.5  --   --  90.5 92.2 91.8  PLT 141* 150  --   --  140* 165 191   Cardiac Enzymes:  Recent Labs Lab 11/14/15 1248 11/14/15 2209 11/15/15 0436  TROPONINI 0.03 5.67* 13.30*   BNP: BNP (last 3 results) No results for input(s): BNP in the last 8760 hours.  ProBNP (last 3 results) No results for input(s): PROBNP in the last 8760 hours.  CBG:  Recent Labs Lab 11/20/15 0610 11/20/15 1107 11/20/15 1728 11/20/15 2250  11/21/15 0625  GLUCAP 131* 223* 154* 121* 123*       Signed:  Delfina Redwood MD  FACP  [facility] 11/21/2015, 10:16 AM

## 2015-11-23 ENCOUNTER — Telehealth: Payer: Self-pay | Admitting: *Deleted

## 2015-11-23 NOTE — Telephone Encounter (Signed)
Transition Care Management Follow-up Telephone Call   Date discharged? 11/21/15   How have you been since you were released from the hospital? Patient reports he is feeling very well.   Do you understand why you were in the hospital? yes   Do you understand the discharge instructions? yes   Where were you discharged to? home   Items Reviewed:  Medications reviewed: yes  Allergies reviewed: yes  Dietary changes reviewed: no  Referrals reviewed: no   Functional Questionnaire:   Activities of Daily Living (ADLs):   He states they are independent in the following: ambulation, bathing and hygiene, feeding, continence, grooming, toileting and dressing States they require assistance with the following: none   Any transportation issues/concerns?: no   Any patient concerns? no   Confirmed importance and date/time of follow-up visits scheduled yes, 11/30/15 @ 1400  Provider Appointment booked with G. Renford Dills, MD  Confirmed with patient if condition begins to worsen call PCP or go to the ER.  Patient was given the office number and encouraged to call back with question or concerns.  : yes

## 2015-11-30 ENCOUNTER — Ambulatory Visit (INDEPENDENT_AMBULATORY_CARE_PROVIDER_SITE_OTHER): Payer: Medicare Other | Admitting: Family Medicine

## 2015-11-30 ENCOUNTER — Encounter: Payer: Self-pay | Admitting: Family Medicine

## 2015-11-30 VITALS — BP 124/50 | HR 66 | Temp 97.9°F | Wt 190.5 lb

## 2015-11-30 DIAGNOSIS — I2583 Coronary atherosclerosis due to lipid rich plaque: Secondary | ICD-10-CM

## 2015-11-30 DIAGNOSIS — D649 Anemia, unspecified: Secondary | ICD-10-CM

## 2015-11-30 DIAGNOSIS — N189 Chronic kidney disease, unspecified: Secondary | ICD-10-CM

## 2015-11-30 DIAGNOSIS — I2510 Atherosclerotic heart disease of native coronary artery without angina pectoris: Secondary | ICD-10-CM | POA: Diagnosis not present

## 2015-11-30 DIAGNOSIS — N179 Acute kidney failure, unspecified: Secondary | ICD-10-CM | POA: Diagnosis not present

## 2015-11-30 DIAGNOSIS — D62 Acute posthemorrhagic anemia: Secondary | ICD-10-CM

## 2015-11-30 DIAGNOSIS — J189 Pneumonia, unspecified organism: Secondary | ICD-10-CM

## 2015-11-30 LAB — CBC WITH DIFFERENTIAL/PLATELET
BASOS ABS: 0 10*3/uL (ref 0.0–0.1)
Basophils Relative: 0.6 % (ref 0.0–3.0)
EOS ABS: 0.4 10*3/uL (ref 0.0–0.7)
Eosinophils Relative: 5.6 % — ABNORMAL HIGH (ref 0.0–5.0)
HCT: 33 % — ABNORMAL LOW (ref 39.0–52.0)
Hemoglobin: 10.7 g/dL — ABNORMAL LOW (ref 13.0–17.0)
LYMPHS ABS: 2.1 10*3/uL (ref 0.7–4.0)
Lymphocytes Relative: 28.4 % (ref 12.0–46.0)
MCHC: 32.5 g/dL (ref 30.0–36.0)
MCV: 91.8 fl (ref 78.0–100.0)
MONO ABS: 0.6 10*3/uL (ref 0.1–1.0)
Monocytes Relative: 8.4 % (ref 3.0–12.0)
NEUTROS PCT: 57 % (ref 43.0–77.0)
Neutro Abs: 4.2 10*3/uL (ref 1.4–7.7)
PLATELETS: 252 10*3/uL (ref 150.0–400.0)
RBC: 3.6 Mil/uL — AB (ref 4.22–5.81)
RDW: 16.1 % — ABNORMAL HIGH (ref 11.5–15.5)
WBC: 7.4 10*3/uL (ref 4.0–10.5)

## 2015-11-30 LAB — BASIC METABOLIC PANEL
BUN: 23 mg/dL (ref 6–23)
CHLORIDE: 100 meq/L (ref 96–112)
CO2: 31 meq/L (ref 19–32)
Calcium: 9.4 mg/dL (ref 8.4–10.5)
Creatinine, Ser: 2.1 mg/dL — ABNORMAL HIGH (ref 0.40–1.50)
GFR: 32.32 mL/min — ABNORMAL LOW (ref >60.00)
Glucose, Bld: 204 mg/dL — ABNORMAL HIGH (ref 70–99)
Potassium: 4.2 mEq/L (ref 3.5–5.1)
SODIUM: 141 meq/L (ref 135–145)

## 2015-11-30 MED ORDER — COLCHICINE 0.6 MG PO TABS: 0.6000 mg | ORAL_TABLET | Freq: Every day | ORAL | Status: DC | PRN

## 2015-11-30 MED ORDER — FUROSEMIDE 80 MG PO TABS: 80.0000 mg | ORAL_TABLET | Freq: Every day | ORAL | Status: DC

## 2015-11-30 MED ORDER — INSULIN GLARGINE 100 UNIT/ML ~~LOC~~ SOLN: 15.0000 [IU] | Freq: Every day | SUBCUTANEOUS | Status: DC

## 2015-11-30 NOTE — Patient Instructions (Signed)
Go to the lab on the way out.  We'll contact you with your lab report. I'll await input from cardiology.  Don't change your meds for now.  Take care.  Glad to see you.

## 2015-11-30 NOTE — Progress Notes (Signed)
Pre visit review using our clinic review tool, if applicable. No additional management support is needed unless otherwise documented below in the visit note.  Hospital follow up.   Admit date: 11/13/2015 Discharge date: 11/21/2015  Time spent: greater than 30 minutes  Recommendations for Outpatient Follow-up:  1. Monitor H/H 2. Monitor BMET   Discharge Diagnoses:  Principal Problem:  GI bleed Active Problems:  Type 2 diabetes mellitus with peripheral neuropathy ([name])  CAD (coronary artery disease)  Hx of CABG  History of gastrointestinal bleeding  Acute-on-chronic kidney injury ([place])  Symptomatic anemia  Acute GI bleeding  Diverticulosis of colon with hemorrhage  NSTEMI (non-ST elevated myocardial infarction) ([place])  HCAP (healthcare-associated pneumonia)  Hypokalemia  Acute on chronic diastolic (congestive) heart failure ([place])   Discharge Condition: stable  Diet recommendation: carbohydrate modified heart healthy  Filed Weights   11/19/15 0336 11/20/15 0414 11/21/15 0626  Weight: 85.7 kg (188 lb 15 oz) 82.419 kg (181 lb 11.2 oz) 81.92 kg (180 lb 9.6 oz)    History of present illness/Hospital Course:  82 y.o. male with a past medical history of coronary artery disease status post percutaneous intervention in July 2015 with stenting to high grade obtuse marginal branch of vein graft. Patient was felt to be a Plavix nonresponder for which she was treated with Brilinta in addition to aspirin therapy. He also has a history of paroxysmal atrial fibrillation and previously had been anticoagulated with Coumadin which was discontinued due to GI bleed. Hospitalized about 5 years ago for GI bleed undergoing upper and lower endoscopy. At the time he required blood transfusions. It was felt that GI bleed represented diverticular bleed. He presents to the emergency department today with complaints of bright red blood per rectum associate with dark colored stools.  Found to have diverticular bleed. 2 clips and injection done. After procedure had chest pain and elevated troponin. Sent to SDU where he was stable 1 days and then had a re-bleed. Had another colonoscopy- unsedated- clipped again  Diverticular GI bleed and gastritis -s/p EGD (gastritis). Dr. Havery Moros rec ppi bid x2 weeks, then daily if on Brilinta. ASA being resumed by cardiology -colonoscopy- bleeding diverticula clipped x 2-- had rebleed with repeat colonoscopy and repeat injection and clipping of same area. If rebleeds --> IR embolization  HCAPNA, possibly aspiration related: had fever and new infiltrate during hospitalization. Minimal cough. Started on vancomycin and zosyn. Quickly narrowed and subsequently stopped due to lack of continued symptoms  NSTEMI: had chest pain and EKG changes after first endoscopy. Also positive troponins. not a candidate for cath/intervention at this time due to GI bleed. cardiology started low dose ASA after second colonoscopy/clips, without subsequent bleeding. Echo with new anterior WMA. EF 50-55%.   ABLA -s/p 3 unit of PRBC. No further bleeding and hemoglobin stable.  History of diastolic congestive heart failure. EF 50-55%. Diuresed by cardiology.  Hypokalemia: Corrected  PAF CHADSVASC 5, not a candidate for anticoag due to recurrent GIB  CKD 3/4 Nephrology consulted in case angiography needed. Creatinine increased with diuresed, then stabilized  Diabetes mellitus. . Remained controlled  Hypertension.  stable   Consultants:  GI  Cardiology  nephro  IR  Procedures:  EGD  Colonoscopy with clips, epi x2       The above was d/w pt.  In meantime: No FCNAVD.  No stool changes.  No CP.  Not SOB unless with exertion and this is as expected.  He feels weaker than preadmission baseline but he is clearly  improved from his nadir, in terms of breathing, exertional capacity, not bleeding, etc.  Has card f/u pending.   PMH and SH  reviewed  ROS: See HPI, otherwise noncontributory.  Meds, vitals, and allergies reviewed.   GEN: nad, alert and oriented HEENT: mucous membranes moist NECK: supple w/o LA CV: rrr.  PULM: ctab, no inc wob ABD: soft, +bs EXT: no edema SKIN: no acute rash but 2cm healing lesion on L calf where skin CA was prev removed.

## 2015-12-01 ENCOUNTER — Other Ambulatory Visit: Payer: Self-pay | Admitting: Family Medicine

## 2015-12-01 DIAGNOSIS — J189 Pneumonia, unspecified organism: Secondary | ICD-10-CM | POA: Insufficient documentation

## 2015-12-01 DIAGNOSIS — Z8701 Personal history of pneumonia (recurrent): Secondary | ICD-10-CM

## 2015-12-01 NOTE — Assessment & Plan Note (Signed)
We can recheck CXR in about 1 month.  CXR d/w pt, imaging reviewed with patient.  ctab today.

## 2015-12-01 NOTE — Assessment & Plan Note (Signed)
The issue remains about balancing antiplatelet therapy for cardiac benefit w/o inducing GI bleed.  D/w pt.  Would continue as is for now and he'll keep f/u with cards.  App help of all involved.

## 2015-12-01 NOTE — Assessment & Plan Note (Signed)
See labs 

## 2015-12-02 ENCOUNTER — Other Ambulatory Visit: Payer: Self-pay | Admitting: Family Medicine

## 2015-12-02 ENCOUNTER — Encounter: Payer: Self-pay | Admitting: Cardiovascular Disease

## 2015-12-02 ENCOUNTER — Ambulatory Visit (HOSPITAL_COMMUNITY)
Admission: RE | Admit: 2015-12-02 | Discharge: 2015-12-02 | Disposition: A | Discharge status: Home / Self Care | Payer: Medicare Other | Source: Ambulatory Visit | Attending: Cardiovascular Disease | Admitting: Cardiovascular Disease

## 2015-12-02 ENCOUNTER — Ambulatory Visit (INDEPENDENT_AMBULATORY_CARE_PROVIDER_SITE_OTHER): Payer: Medicare Other | Admitting: Cardiovascular Disease

## 2015-12-02 VITALS — BP 126/62 | HR 74 | Ht 69.0 in | Wt 191.7 lb

## 2015-12-02 DIAGNOSIS — E785 Hyperlipidemia, unspecified: Secondary | ICD-10-CM | POA: Insufficient documentation

## 2015-12-02 DIAGNOSIS — I10 Essential (primary) hypertension: Secondary | ICD-10-CM | POA: Insufficient documentation

## 2015-12-02 DIAGNOSIS — I480 Paroxysmal atrial fibrillation: Secondary | ICD-10-CM | POA: Diagnosis not present

## 2015-12-02 DIAGNOSIS — I2510 Atherosclerotic heart disease of native coronary artery without angina pectoris: Secondary | ICD-10-CM

## 2015-12-02 DIAGNOSIS — Z951 Presence of aortocoronary bypass graft: Secondary | ICD-10-CM | POA: Insufficient documentation

## 2015-12-02 DIAGNOSIS — I779 Disorder of arteries and arterioles, unspecified: Secondary | ICD-10-CM | POA: Diagnosis not present

## 2015-12-02 DIAGNOSIS — E119 Type 2 diabetes mellitus without complications: Secondary | ICD-10-CM | POA: Diagnosis not present

## 2015-12-02 DIAGNOSIS — Z87891 Personal history of nicotine dependence: Secondary | ICD-10-CM | POA: Insufficient documentation

## 2015-12-02 DIAGNOSIS — I739 Peripheral vascular disease, unspecified: Secondary | ICD-10-CM

## 2015-12-02 DIAGNOSIS — I6523 Occlusion and stenosis of bilateral carotid arteries: Secondary | ICD-10-CM | POA: Insufficient documentation

## 2015-12-02 DIAGNOSIS — I119 Hypertensive heart disease without heart failure: Secondary | ICD-10-CM | POA: Diagnosis not present

## 2015-12-02 DIAGNOSIS — I2583 Coronary atherosclerosis due to lipid rich plaque: Secondary | ICD-10-CM

## 2015-12-02 NOTE — Telephone Encounter (Signed)
Received refill request electronically No longer on medication list Please confirm directions Last office visit 11/30/15

## 2015-12-02 NOTE — Patient Instructions (Signed)
Medication Instructions:  Your physician recommends that you continue on your current medications as directed. Please refer to the Current Medication list given to you today.   Labwork: Your physician recommends that you return for lab work in: FASTING (lipid/liver) The lab can be found on the FIRST FLOOR of out building in [address]   Testing/Procedures: none  Follow-Up: Your physician wants you to follow-up in: 6 months with Dr. Gwenlyn Found. You will receive a reminder letter in the mail two months in advance. If you don't receive a letter, please call our office to schedule the follow-up appointment.   Any Other Special Instructions Will Be Listed Below (If Applicable).     If you need a refill on your cardiac medications before your next appointment, please call your pharmacy.

## 2015-12-02 NOTE — Telephone Encounter (Signed)
Sent to use with pens.  Thanks.

## 2015-12-02 NOTE — Assessment & Plan Note (Signed)
History of coronary artery disease status post bypass grafting in 1996 and redo by [name] in 2006. He's had multiple stent procedures since that time. He was deemed to be a Plavix nonresponder and placed on Brilenta. Recent echo revealed EF of 50% with a new anterior wall motion and [place]. He was recently admitted on 11/13/15 for a GI bleed. He had positive enzymes and had a "non-STEMI after an endoscopy. He has moderate chronic renal insufficiency and because of this and the fact that he is not an oral anticoagulation or antiplatelet candidate he was not re-cathed. He now has chronic stable exertional angina on maximal antianginal medications.

## 2015-12-02 NOTE — Assessment & Plan Note (Signed)
History of carotid artery disease status post remote right carotid endarterectomy performed by [name] in 1994. Recent carotid Dopplers performed 12/02/15 revealed a widely patent and throat are endarterectomy site with moderate bilateral ICA stenosis. He is neurologically asymptomatic. We will repeat these in one year.

## 2015-12-02 NOTE — Assessment & Plan Note (Signed)
History of hyperlipidemia on statin therapy. It has been over a year since his last lipid and liver profile. We will recheck this

## 2015-12-02 NOTE — Progress Notes (Signed)
12/02/2015 [name]   12-05-1931  [id number]  Primary Physician [name], MD Primary Cardiologist: [name] MD Renae Gloss   HPI:  [name] is a very pleasant 82 year old engaged Caucasian male  formerly a patient of [name]. I last saw him in the office 09/02/15. [name] is the father of one daughter and one son , the grandfather to 2 grandchildren. 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 His past medical history is remarkable for coronary artery bypass grafting in 1996 and again in 2006 by [name]. He had remote right carotid endarterectomy by [name] in 1994. History of hypertension, hyperlipidemia and diabetes. He was hospitalized in late July 2015 because of unstable angina. [name] performed cardiac catheterization revealing a high-grade obtuse marginal branch vein graft stenosis which was stented. Because of recurrent chest pain he was re-intervention relates later and found to have a new hazy lesion just proximal to the previously placed stent which was again intervened on. He was deemed a Plavix nonresponder and was begun on Brilenta. He also has a remote history of PAF as well as diverticulitis and has had GI bleed on Coumadin. His oral anticoagulation was discontinued.  He was recently admitted on 11/13/15 for a GI bleed. He underwent endoscopy revealing gastritis, colonoscopy revealing bleeding diverticula which was treated with clipping. He had 3 units of packed red blood cell transfusion. He did have chest pain and positive enzymes after his endoscopy within the echo that showed an EF of 50% with a new anterior wall motion and reality. He was thought to have "a non-STEMI.  His Plavix was discontinued. Because of his moderate renal insufficiency and his inability to take antiplatelet therapy he did not undergo repeat cardiac catheterization. He has chronic stable effort angina on maximal  antianginal therapy.   Current Outpatient Prescriptions  Medication Sig Dispense Refill  . aspirin 81 MG tablet Take 81 mg by mouth daily.    . Cholecalciferol (VITAMIN D) 1000 UNITS capsule Take 1,000 Units by mouth daily.     . colchicine 0.6 MG tablet Take 1 tablet (0.6 mg total) by mouth daily as needed (gout flare up).    . fish oil-omega-3 fatty acids 1000 MG capsule Take 1 g by mouth daily.     . furosemide (LASIX) 80 MG tablet Take 1 tablet (80 mg total) by mouth daily.    . insulin glargine (LANTUS) 100 UNIT/ML injection Inject 0.15 mLs (15 Units total) into the skin at bedtime. 10 mL   . isosorbide mononitrate (IMDUR) 30 MG 24 hr tablet TAKE 2 TABLETS TWICE A DAY 360 tablet 1  . magnesium oxide (MAG-OX) 400 MG tablet Take 400 mg by mouth daily.     . Multiple Vitamin (MULTIVITAMIN WITH MINERALS) TABS Take 1 tablet by mouth daily.    . nitroGLYCERIN (NITROSTAT) 0.4 MG SL tablet Place 1 tablet (0.4 mg total) under the tongue every 5 (five) minutes as needed for chest pain. 100 tablet 1  . Potassium Gluconate (K-99) 595 MG CAPS Take 595 mg by mouth daily.     . ranolazine (RANEXA) 500 MG 12 hr tablet Take 1 tablet (500 mg total) by mouth 2 (two) times daily. 90 tablet 3  . rosuvastatin (CRESTOR) 20 MG tablet Take 1 tablet (20 mg total) by mouth daily. 90 tablet 1  . TOPROL XL 50 MG 24 hr tablet TAKE 1 TABLET DAILY WITH A MEAL OR  IMMEDIATELY FOLLOWING A MEAL 90 tablet 3  . traMADol (ULTRAM) 50 MG tablet Take 1 tablet (50 mg total) by mouth every 6 (six) hours as needed. ARTHRITIS (Patient taking differently: Take 100 mg by mouth 2 (two) times daily. Scheduled (for arthritis pain)) 360 tablet 1   No current facility-administered medications for this visit.    No Known Allergies  Social History   Social History  . Marital Status: Widowed    Spouse Name: N/A  . Number of Children: 2  . Years of Education: N/A   Occupational History  . 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 Smoking status: Former Smoker -- 1.00 packs/day for 8 years    Types: Cigarettes  . Smokeless tobacco: Never Used     Comment: "quit smoking cigarettes in 1958"  . Alcohol Use: No  . Drug Use: No  . Sexual Activity: Yes   Other Topics Concern  . Not on file   Social History Narrative   From [place].  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  Widowed 12/2005.     Review of Systems: General: negative for chills, fever, night sweats or weight changes.  Cardiovascular: negative for chest pain, dyspnea on exertion, edema, orthopnea, palpitations, paroxysmal nocturnal dyspnea or shortness of breath Dermatological: negative for rash Respiratory: negative for cough or wheezing Urologic: negative for hematuria Abdominal: negative for nausea, vomiting, diarrhea, bright red blood per rectum, melena, or hematemesis Neurologic: negative for visual changes, syncope, or dizziness All other systems reviewed and are otherwise negative except as noted above.    Blood pressure 126/62, pulse 74, height 5\' 9"  (1.753 m), weight 191 lb 11.2 oz (86.955 kg).  General appearance: alert and no distress Neck: no adenopathy, no JVD, supple, symmetrical, trachea midline, thyroid not enlarged, symmetric, no tenderness/mass/nodules and soft right carotid bruit Lungs: clear to auscultation bilaterally Heart: regular rate and rhythm, S1, S2 normal, no murmur, click, rub or gallop Extremities: extremities normal, atraumatic, no cyanosis or edema  EKG not performed today  ASSESSMENT AND PLAN:   Paroxysmal atrial fibrillation History of paroxysmal atrial fib currently not on oral anticoagulation because of recent GI bleed  Hyperlipidemia History of hyperlipidemia on statin therapy. It has been over a year since his last lipid and liver profile. We will recheck this  Carotid artery disease History of carotid artery  disease status post remote right carotid endarterectomy performed by [name] in 1994. Recent carotid Dopplers performed 12/02/15 revealed a widely patent and throat are endarterectomy site with moderate bilateral ICA stenosis. He is neurologically asymptomatic. We will repeat these in one year.  CAD (coronary artery disease) History of coronary artery disease status post bypass grafting in 1996 and redo by [name] in 2006. He's had multiple stent procedures since that time. He was deemed to be a Plavix nonresponder and placed on Brilenta. Recent echo revealed EF of 50% with a new anterior wall motion and O'Malley. He was recently admitted on 11/13/15 for a GI bleed. He had positive enzymes and had a "non-STEMI after an endoscopy. He has moderate chronic renal insufficiency and because of this and the fact that he is not an oral anticoagulation or antiplatelet candidate he was not re-cathed. He now has chronic stable exertional angina on maximal antianginal medications.      [name] MD FACP,FACC,FAHA, [facility] 12/02/2015 9:28 AM

## 2015-12-02 NOTE — Assessment & Plan Note (Signed)
History of paroxysmal atrial fib currently not on oral anticoagulation because of recent GI bleed

## 2015-12-09 ENCOUNTER — Other Ambulatory Visit: Payer: Self-pay

## 2015-12-09 DIAGNOSIS — I6523 Occlusion and stenosis of bilateral carotid arteries: Secondary | ICD-10-CM

## 2015-12-18 ENCOUNTER — Other Ambulatory Visit (INDEPENDENT_AMBULATORY_CARE_PROVIDER_SITE_OTHER): Payer: Medicare Other

## 2015-12-18 ENCOUNTER — Ambulatory Visit (INDEPENDENT_AMBULATORY_CARE_PROVIDER_SITE_OTHER)
Admission: RE | Admit: 2015-12-18 | Discharge: 2015-12-18 | Disposition: A | Discharge status: Home / Self Care | Payer: Medicare Other | Source: Ambulatory Visit | Attending: Family Medicine | Admitting: Family Medicine

## 2015-12-18 DIAGNOSIS — Z8701 Personal history of pneumonia (recurrent): Secondary | ICD-10-CM | POA: Diagnosis not present

## 2015-12-18 DIAGNOSIS — I119 Hypertensive heart disease without heart failure: Secondary | ICD-10-CM

## 2015-12-18 DIAGNOSIS — J189 Pneumonia, unspecified organism: Secondary | ICD-10-CM | POA: Diagnosis not present

## 2015-12-19 LAB — LIPID PANEL
CHOL/HDL RATIO: 3.3 ratio (ref <=5.0)
Cholesterol: 128 mg/dL (ref 125–200)
HDL: 39 mg/dL — AB (ref >=40)
LDL CALC: 70 mg/dL (ref <130)
Triglycerides: 95 mg/dL (ref <150)
VLDL: 19 mg/dL (ref <30)

## 2015-12-19 LAB — HEPATIC FUNCTION PANEL
ALK PHOS: 65 U/L (ref 40–115)
ALT: 15 U/L (ref 9–46)
AST: 21 U/L (ref 10–35)
Albumin: 4 g/dL (ref 3.6–5.1)
BILIRUBIN DIRECT: 0.2 mg/dL (ref <=0.2)
BILIRUBIN INDIRECT: 0.8 mg/dL (ref 0.2–1.2)
TOTAL PROTEIN: 7.3 g/dL (ref 6.1–8.1)
Total Bilirubin: 1 mg/dL (ref 0.2–1.2)

## 2016-01-01 ENCOUNTER — Telehealth: Payer: Self-pay | Admitting: Family Medicine

## 2016-01-01 NOTE — Telephone Encounter (Signed)
[facility] [place]  Patient Name: [name]  DOB: [birth year]    Initial Comment Caller states he has a history of diverticular bleeding. He is having a lot of weakness and feeling bad. Is worried he is bleeding again.   Nurse Assessment  Nurse: Wayne Sever, RN, Tillie Rung Date/Time (Eastern Time): 01/01/2016 3:45:41 PM  Confirm and document reason for call. If symptomatic, describe symptoms. You must click the next button to save text entered. ---Caller states his stool has been dark for 2 days. Caller was wanting to see if he could get blood checked today at [facility]. He has a history of Diverticulitis.  Has the patient traveled out of the country within the last 30 days? ---Not Applicable  Does the patient have any new or worsening symptoms? ---Yes  Will a triage be completed? ---Yes  Related visit to physician within the last 2 weeks? ---N/A  Does the PT have any chronic conditions? (i.e. diabetes, asthma, etc.) ---Yes  List chronic conditions. ---Diverticultis, Heart Issues, MI in the past, Pneumonia, Type 1 Diabetic  Is this a behavioral health or substance abuse call? ---No     Guidelines    Guideline Title Affirmed Question Affirmed Notes  Rectal Bleeding Tarry or jet black-colored stool (not dark green)    Final Disposition User   Go to ED Now Wayne Sever, RN, [facility] - ED   Disagree/Comply: Comply

## 2016-01-01 NOTE — Telephone Encounter (Signed)
Will await ER notes  

## 2016-01-03 ENCOUNTER — Encounter (HOSPITAL_COMMUNITY): Payer: Self-pay | Admitting: Emergency Medicine

## 2016-01-03 ENCOUNTER — Inpatient Hospital Stay (HOSPITAL_COMMUNITY)
Admission: EM | Admit: 2016-01-03 | Discharge: 2016-01-20 | DRG: 329 | Disposition: A | Discharge status: Skilled Nursing Facility | Payer: Medicare Other | Attending: Internal Medicine | Admitting: Internal Medicine

## 2016-01-03 ENCOUNTER — Emergency Department (HOSPITAL_COMMUNITY): Payer: Medicare Other

## 2016-01-03 ENCOUNTER — Inpatient Hospital Stay (HOSPITAL_COMMUNITY): Payer: Medicare Other

## 2016-01-03 DIAGNOSIS — K625 Hemorrhage of anus and rectum: Secondary | ICD-10-CM

## 2016-01-03 DIAGNOSIS — I081 Rheumatic disorders of both mitral and tricuspid valves: Secondary | ICD-10-CM | POA: Diagnosis present

## 2016-01-03 DIAGNOSIS — I272 Other secondary pulmonary hypertension: Secondary | ICD-10-CM | POA: Diagnosis present

## 2016-01-03 DIAGNOSIS — I25118 Atherosclerotic heart disease of native coronary artery with other forms of angina pectoris: Secondary | ICD-10-CM | POA: Diagnosis present

## 2016-01-03 DIAGNOSIS — D62 Acute posthemorrhagic anemia: Secondary | ICD-10-CM | POA: Diagnosis present

## 2016-01-03 DIAGNOSIS — K219 Gastro-esophageal reflux disease without esophagitis: Secondary | ICD-10-CM | POA: Diagnosis present

## 2016-01-03 DIAGNOSIS — Z515 Encounter for palliative care: Secondary | ICD-10-CM | POA: Diagnosis not present

## 2016-01-03 DIAGNOSIS — I481 Persistent atrial fibrillation: Secondary | ICD-10-CM | POA: Diagnosis not present

## 2016-01-03 DIAGNOSIS — M25462 Effusion, left knee: Secondary | ICD-10-CM | POA: Diagnosis not present

## 2016-01-03 DIAGNOSIS — I480 Paroxysmal atrial fibrillation: Secondary | ICD-10-CM | POA: Diagnosis present

## 2016-01-03 DIAGNOSIS — I5189 Other ill-defined heart diseases: Secondary | ICD-10-CM | POA: Diagnosis present

## 2016-01-03 DIAGNOSIS — I5042 Chronic combined systolic (congestive) and diastolic (congestive) heart failure: Secondary | ICD-10-CM | POA: Insufficient documentation

## 2016-01-03 DIAGNOSIS — Z66 Do not resuscitate: Secondary | ICD-10-CM | POA: Diagnosis not present

## 2016-01-03 DIAGNOSIS — Z87891 Personal history of nicotine dependence: Secondary | ICD-10-CM

## 2016-01-03 DIAGNOSIS — I4891 Unspecified atrial fibrillation: Secondary | ICD-10-CM | POA: Diagnosis present

## 2016-01-03 DIAGNOSIS — I5032 Chronic diastolic (congestive) heart failure: Secondary | ICD-10-CM

## 2016-01-03 DIAGNOSIS — Z951 Presence of aortocoronary bypass graft: Secondary | ICD-10-CM | POA: Diagnosis not present

## 2016-01-03 DIAGNOSIS — N184 Chronic kidney disease, stage 4 (severe): Secondary | ICD-10-CM | POA: Diagnosis present

## 2016-01-03 DIAGNOSIS — I6789 Other cerebrovascular disease: Secondary | ICD-10-CM | POA: Diagnosis not present

## 2016-01-03 DIAGNOSIS — K5731 Diverticulosis of large intestine without perforation or abscess with bleeding: Principal | ICD-10-CM | POA: Diagnosis present

## 2016-01-03 DIAGNOSIS — K922 Gastrointestinal hemorrhage, unspecified: Secondary | ICD-10-CM | POA: Insufficient documentation

## 2016-01-03 DIAGNOSIS — I6522 Occlusion and stenosis of left carotid artery: Secondary | ICD-10-CM | POA: Diagnosis present

## 2016-01-03 DIAGNOSIS — R079 Chest pain, unspecified: Secondary | ICD-10-CM | POA: Diagnosis not present

## 2016-01-03 DIAGNOSIS — K5730 Diverticulosis of large intestine without perforation or abscess without bleeding: Secondary | ICD-10-CM | POA: Diagnosis not present

## 2016-01-03 DIAGNOSIS — M1990 Unspecified osteoarthritis, unspecified site: Secondary | ICD-10-CM | POA: Diagnosis not present

## 2016-01-03 DIAGNOSIS — D649 Anemia, unspecified: Secondary | ICD-10-CM | POA: Diagnosis present

## 2016-01-03 DIAGNOSIS — K2901 Acute gastritis with bleeding: Secondary | ICD-10-CM | POA: Diagnosis not present

## 2016-01-03 DIAGNOSIS — R531 Weakness: Secondary | ICD-10-CM | POA: Diagnosis not present

## 2016-01-03 DIAGNOSIS — I208 Other forms of angina pectoris: Secondary | ICD-10-CM | POA: Diagnosis not present

## 2016-01-03 DIAGNOSIS — I4510 Unspecified right bundle-branch block: Secondary | ICD-10-CM | POA: Diagnosis present

## 2016-01-03 DIAGNOSIS — Z7982 Long term (current) use of aspirin: Secondary | ICD-10-CM

## 2016-01-03 DIAGNOSIS — Z86010 Personal history of colonic polyps: Secondary | ICD-10-CM | POA: Diagnosis not present

## 2016-01-03 DIAGNOSIS — Z8261 Family history of arthritis: Secondary | ICD-10-CM | POA: Diagnosis not present

## 2016-01-03 DIAGNOSIS — E669 Obesity, unspecified: Secondary | ICD-10-CM | POA: Diagnosis present

## 2016-01-03 DIAGNOSIS — Z6827 Body mass index (BMI) 27.0-27.9, adult: Secondary | ICD-10-CM | POA: Diagnosis not present

## 2016-01-03 DIAGNOSIS — K921 Melena: Secondary | ICD-10-CM | POA: Diagnosis not present

## 2016-01-03 DIAGNOSIS — E119 Type 2 diabetes mellitus without complications: Secondary | ICD-10-CM | POA: Diagnosis present

## 2016-01-03 DIAGNOSIS — Z841 Family history of disorders of kidney and ureter: Secondary | ICD-10-CM | POA: Diagnosis not present

## 2016-01-03 DIAGNOSIS — E118 Type 2 diabetes mellitus with unspecified complications: Secondary | ICD-10-CM

## 2016-01-03 DIAGNOSIS — I25119 Atherosclerotic heart disease of native coronary artery with unspecified angina pectoris: Secondary | ICD-10-CM | POA: Diagnosis present

## 2016-01-03 DIAGNOSIS — Z8719 Personal history of other diseases of the digestive system: Secondary | ICD-10-CM | POA: Diagnosis present

## 2016-01-03 DIAGNOSIS — E1151 Type 2 diabetes mellitus with diabetic peripheral angiopathy without gangrene: Secondary | ICD-10-CM | POA: Diagnosis present

## 2016-01-03 DIAGNOSIS — N189 Chronic kidney disease, unspecified: Secondary | ICD-10-CM

## 2016-01-03 DIAGNOSIS — M1712 Unilateral primary osteoarthritis, left knee: Secondary | ICD-10-CM | POA: Diagnosis present

## 2016-01-03 DIAGNOSIS — Z833 Family history of diabetes mellitus: Secondary | ICD-10-CM | POA: Diagnosis not present

## 2016-01-03 DIAGNOSIS — L8995 Pressure ulcer of unspecified site, unstageable: Secondary | ICD-10-CM | POA: Clinically undetermined

## 2016-01-03 DIAGNOSIS — Z7189 Other specified counseling: Secondary | ICD-10-CM | POA: Diagnosis present

## 2016-01-03 DIAGNOSIS — E785 Hyperlipidemia, unspecified: Secondary | ICD-10-CM | POA: Diagnosis present

## 2016-01-03 DIAGNOSIS — K5900 Constipation, unspecified: Secondary | ICD-10-CM | POA: Diagnosis not present

## 2016-01-03 DIAGNOSIS — I209 Angina pectoris, unspecified: Secondary | ICD-10-CM | POA: Diagnosis not present

## 2016-01-03 DIAGNOSIS — I129 Hypertensive chronic kidney disease with stage 1 through stage 4 chronic kidney disease, or unspecified chronic kidney disease: Secondary | ICD-10-CM | POA: Diagnosis present

## 2016-01-03 DIAGNOSIS — D500 Iron deficiency anemia secondary to blood loss (chronic): Secondary | ICD-10-CM | POA: Diagnosis present

## 2016-01-03 DIAGNOSIS — G4733 Obstructive sleep apnea (adult) (pediatric): Secondary | ICD-10-CM | POA: Diagnosis present

## 2016-01-03 DIAGNOSIS — K5791 Diverticulosis of intestine, part unspecified, without perforation or abscess with bleeding: Secondary | ICD-10-CM | POA: Diagnosis not present

## 2016-01-03 DIAGNOSIS — K2961 Other gastritis with bleeding: Secondary | ICD-10-CM | POA: Diagnosis not present

## 2016-01-03 DIAGNOSIS — R262 Difficulty in walking, not elsewhere classified: Secondary | ICD-10-CM | POA: Diagnosis not present

## 2016-01-03 DIAGNOSIS — Z794 Long term (current) use of insulin: Secondary | ICD-10-CM

## 2016-01-03 DIAGNOSIS — M1000 Idiopathic gout, unspecified site: Secondary | ICD-10-CM | POA: Diagnosis not present

## 2016-01-03 DIAGNOSIS — I5030 Unspecified diastolic (congestive) heart failure: Secondary | ICD-10-CM | POA: Insufficient documentation

## 2016-01-03 DIAGNOSIS — E559 Vitamin D deficiency, unspecified: Secondary | ICD-10-CM | POA: Diagnosis not present

## 2016-01-03 DIAGNOSIS — I252 Old myocardial infarction: Secondary | ICD-10-CM | POA: Diagnosis not present

## 2016-01-03 DIAGNOSIS — I214 Non-ST elevation (NSTEMI) myocardial infarction: Secondary | ICD-10-CM | POA: Diagnosis not present

## 2016-01-03 DIAGNOSIS — Z955 Presence of coronary angioplasty implant and graft: Secondary | ICD-10-CM | POA: Diagnosis not present

## 2016-01-03 DIAGNOSIS — N179 Acute kidney failure, unspecified: Secondary | ICD-10-CM | POA: Diagnosis present

## 2016-01-03 DIAGNOSIS — E1122 Type 2 diabetes mellitus with diabetic chronic kidney disease: Secondary | ICD-10-CM | POA: Diagnosis present

## 2016-01-03 DIAGNOSIS — I119 Hypertensive heart disease without heart failure: Secondary | ICD-10-CM | POA: Diagnosis not present

## 2016-01-03 DIAGNOSIS — I10 Essential (primary) hypertension: Secondary | ICD-10-CM | POA: Diagnosis not present

## 2016-01-03 DIAGNOSIS — E569 Vitamin deficiency, unspecified: Secondary | ICD-10-CM | POA: Diagnosis not present

## 2016-01-03 DIAGNOSIS — I2510 Atherosclerotic heart disease of native coronary artery without angina pectoris: Secondary | ICD-10-CM | POA: Diagnosis not present

## 2016-01-03 DIAGNOSIS — R279 Unspecified lack of coordination: Secondary | ICD-10-CM | POA: Diagnosis not present

## 2016-01-03 DIAGNOSIS — R52 Pain, unspecified: Secondary | ICD-10-CM | POA: Diagnosis not present

## 2016-01-03 DIAGNOSIS — K254 Chronic or unspecified gastric ulcer with hemorrhage: Secondary | ICD-10-CM | POA: Diagnosis not present

## 2016-01-03 DIAGNOSIS — L89152 Pressure ulcer of sacral region, stage 2: Secondary | ICD-10-CM | POA: Diagnosis not present

## 2016-01-03 DIAGNOSIS — I5033 Acute on chronic diastolic (congestive) heart failure: Secondary | ICD-10-CM | POA: Diagnosis not present

## 2016-01-03 DIAGNOSIS — I519 Heart disease, unspecified: Secondary | ICD-10-CM | POA: Diagnosis not present

## 2016-01-03 DIAGNOSIS — E1140 Type 2 diabetes mellitus with diabetic neuropathy, unspecified: Secondary | ICD-10-CM

## 2016-01-03 DIAGNOSIS — M109 Gout, unspecified: Secondary | ICD-10-CM | POA: Diagnosis present

## 2016-01-03 DIAGNOSIS — E1142 Type 2 diabetes mellitus with diabetic polyneuropathy: Secondary | ICD-10-CM | POA: Diagnosis present

## 2016-01-03 DIAGNOSIS — M6281 Muscle weakness (generalized): Secondary | ICD-10-CM | POA: Diagnosis not present

## 2016-01-03 DIAGNOSIS — N183 Chronic kidney disease, stage 3 (moderate): Secondary | ICD-10-CM | POA: Diagnosis not present

## 2016-01-03 DIAGNOSIS — I509 Heart failure, unspecified: Secondary | ICD-10-CM | POA: Diagnosis not present

## 2016-01-03 DIAGNOSIS — Z48815 Encounter for surgical aftercare following surgery on the digestive system: Secondary | ICD-10-CM | POA: Diagnosis not present

## 2016-01-03 DIAGNOSIS — M25469 Effusion, unspecified knee: Secondary | ICD-10-CM

## 2016-01-03 DIAGNOSIS — L8990 Pressure ulcer of unspecified site, unspecified stage: Secondary | ICD-10-CM | POA: Diagnosis not present

## 2016-01-03 LAB — CBC
HCT: 22.6 % — ABNORMAL LOW (ref 39.0–52.0)
HEMATOCRIT: 23.2 % — AB (ref 39.0–52.0)
HEMOGLOBIN: 7.2 g/dL — AB (ref 13.0–17.0)
Hemoglobin: 7.1 g/dL — ABNORMAL LOW (ref 13.0–17.0)
MCH: 29.1 pg (ref 26.0–34.0)
MCH: 29.6 pg (ref 26.0–34.0)
MCHC: 31 g/dL (ref 30.0–36.0)
MCHC: 31.4 g/dL (ref 30.0–36.0)
MCV: 93.9 fL (ref 78.0–100.0)
MCV: 94.2 fL (ref 78.0–100.0)
PLATELETS: 182 10*3/uL (ref 150–400)
Platelets: 189 10*3/uL (ref 150–400)
RBC: 2.47 MIL/uL — AB (ref 4.22–5.81)
RBC: 2.4 MIL/uL — ABNORMAL LOW (ref 4.22–5.81)
RDW: 15.6 % — AB (ref 11.5–15.5)
RDW: 15.7 % — AB (ref 11.5–15.5)
WBC: 7 10*3/uL (ref 4.0–10.5)
WBC: 7.2 10*3/uL (ref 4.0–10.5)

## 2016-01-03 LAB — COMPREHENSIVE METABOLIC PANEL
ALK PHOS: 57 U/L (ref 38–126)
ALT: 18 U/L (ref 17–63)
AST: 22 U/L (ref 15–41)
Albumin: 3 g/dL — ABNORMAL LOW (ref 3.5–5.0)
Anion gap: 14 (ref 5–15)
BUN: 35 mg/dL — ABNORMAL HIGH (ref 6–20)
CO2: 28 mmol/L (ref 22–32)
Calcium: 9.3 mg/dL (ref 8.9–10.3)
Chloride: 100 mmol/L — ABNORMAL LOW (ref 101–111)
Creatinine, Ser: 2.26 mg/dL — ABNORMAL HIGH (ref 0.61–1.24)
GFR, EST AFRICAN AMERICAN: 30 mL/min — AB (ref >60)
GFR, EST NON AFRICAN AMERICAN: 26 mL/min — AB (ref >60)
GLUCOSE: 199 mg/dL — AB (ref 65–99)
Potassium: 3.7 mmol/L (ref 3.5–5.1)
Sodium: 142 mmol/L (ref 135–145)
TOTAL PROTEIN: 6.2 g/dL — AB (ref 6.5–8.1)
Total Bilirubin: 0.5 mg/dL (ref 0.3–1.2)

## 2016-01-03 LAB — BASIC METABOLIC PANEL
ANION GAP: 8 (ref 5–15)
BUN: 34 mg/dL — ABNORMAL HIGH (ref 6–20)
CO2: 30 mmol/L (ref 22–32)
Calcium: 9 mg/dL (ref 8.9–10.3)
Chloride: 102 mmol/L (ref 101–111)
Creatinine, Ser: 2.16 mg/dL — ABNORMAL HIGH (ref 0.61–1.24)
GFR, EST AFRICAN AMERICAN: 31 mL/min — AB (ref >60)
GFR, EST NON AFRICAN AMERICAN: 27 mL/min — AB (ref >60)
Glucose, Bld: 163 mg/dL — ABNORMAL HIGH (ref 65–99)
POTASSIUM: 3.8 mmol/L (ref 3.5–5.1)
SODIUM: 140 mmol/L (ref 135–145)

## 2016-01-03 LAB — MRSA PCR SCREENING: MRSA by PCR: NEGATIVE

## 2016-01-03 LAB — HEMOGLOBIN AND HEMATOCRIT, BLOOD
HCT: 29 % — ABNORMAL LOW (ref 39.0–52.0)
HEMATOCRIT: 29.8 % — AB (ref 39.0–52.0)
Hemoglobin: 9.4 g/dL — ABNORMAL LOW (ref 13.0–17.0)
Hemoglobin: 9.1 g/dL — ABNORMAL LOW (ref 13.0–17.0)

## 2016-01-03 LAB — PREPARE RBC (CROSSMATCH)

## 2016-01-03 LAB — I-STAT TROPONIN, ED: Troponin i, poc: 0.02 ng/mL (ref 0.00–0.08)

## 2016-01-03 LAB — GLUCOSE, CAPILLARY: Glucose-Capillary: 113 mg/dL — ABNORMAL HIGH (ref 65–99)

## 2016-01-03 LAB — POC OCCULT BLOOD, ED: Fecal Occult Bld: POSITIVE — AB

## 2016-01-03 MED ORDER — ISOSORBIDE MONONITRATE ER 60 MG PO TB24
60.0000 mg | ORAL_TABLET | Freq: Two times a day (BID) | ORAL | Status: DC
  Administered 2016-01-03 – 2016-01-13 (×21): 60 mg via ORAL
  Filled 2016-01-03: qty 1
  Filled 2016-01-03: qty 2
  Filled 2016-01-03 (×5): qty 1
  Filled 2016-01-03: qty 2
  Filled 2016-01-03: qty 1
  Filled 2016-01-03: qty 2
  Filled 2016-01-03 (×3): qty 1
  Filled 2016-01-03: qty 2
  Filled 2016-01-03: qty 1
  Filled 2016-01-03 (×3): qty 2
  Filled 2016-01-03 (×2): qty 1
  Filled 2016-01-03: qty 2
  Filled 2016-01-03: qty 1

## 2016-01-03 MED ORDER — PANTOPRAZOLE SODIUM 40 MG IV SOLR
40.0000 mg | Freq: Once | INTRAVENOUS | Status: AC
  Administered 2016-01-03: 40 mg via INTRAVENOUS
  Filled 2016-01-03: qty 40

## 2016-01-03 MED ORDER — SODIUM CHLORIDE 0.9 % IV SOLN
INTRAVENOUS | Status: DC
  Administered 2016-01-03: 06:00:00 via INTRAVENOUS

## 2016-01-03 MED ORDER — OXYCODONE HCL 5 MG PO TABS: 5.0000 mg | ORAL_TABLET | ORAL | Status: DC | PRN

## 2016-01-03 MED ORDER — RANOLAZINE ER 500 MG PO TB12
500.0000 mg | ORAL_TABLET | Freq: Two times a day (BID) | ORAL | Status: DC
  Administered 2016-01-03 – 2016-01-07 (×8): 500 mg via ORAL
  Filled 2016-01-03 (×10): qty 1

## 2016-01-03 MED ORDER — SODIUM CHLORIDE 0.9 % IV SOLN: Freq: Once | INTRAVENOUS | Status: DC

## 2016-01-03 MED ORDER — ONDANSETRON HCL 4 MG/2ML IJ SOLN
4.0000 mg | Freq: Four times a day (QID) | INTRAMUSCULAR | Status: DC | PRN
  Administered 2016-01-14 (×2): 4 mg via INTRAVENOUS
  Filled 2016-01-03 (×3): qty 2

## 2016-01-03 MED ORDER — PANTOPRAZOLE SODIUM 40 MG IV SOLR
40.0000 mg | Freq: Two times a day (BID) | INTRAVENOUS | Status: DC
  Administered 2016-01-03 (×2): 40 mg via INTRAVENOUS
  Filled 2016-01-03 (×3): qty 40

## 2016-01-03 MED ORDER — ACETAMINOPHEN 650 MG RE SUPP: 650.0000 mg | Freq: Four times a day (QID) | RECTAL | Status: DC | PRN

## 2016-01-03 MED ORDER — SODIUM CHLORIDE 0.9 % IV SOLN
Freq: Once | INTRAVENOUS | Status: AC
Start: 2016-01-03 — End: 2016-01-05
  Administered 2016-01-05: 500 mL via INTRAVENOUS

## 2016-01-03 MED ORDER — TECHNETIUM TC 99M-LABELED RED BLOOD CELLS IV KIT
25.0000 | PACK | Freq: Once | INTRAVENOUS | Status: AC | PRN
  Administered 2016-01-03: 25 via INTRAVENOUS

## 2016-01-03 MED ORDER — HYDROMORPHONE HCL 1 MG/ML IJ SOLN: 0.5000 mg | INTRAMUSCULAR | Status: DC | PRN

## 2016-01-03 MED ORDER — ONDANSETRON HCL 4 MG PO TABS: 4.0000 mg | ORAL_TABLET | Freq: Four times a day (QID) | ORAL | Status: DC | PRN

## 2016-01-03 MED ORDER — INSULIN ASPART 100 UNIT/ML ~~LOC~~ SOLN
0.0000 [IU] | SUBCUTANEOUS | Status: DC
  Administered 2016-01-04: 2 [IU] via SUBCUTANEOUS
  Administered 2016-01-04 – 2016-01-05 (×5): 1 [IU] via SUBCUTANEOUS
  Administered 2016-01-05 – 2016-01-06 (×2): 2 [IU] via SUBCUTANEOUS
  Administered 2016-01-06 (×2): 1 [IU] via SUBCUTANEOUS

## 2016-01-03 MED ORDER — ACETAMINOPHEN 325 MG PO TABS: 650.0000 mg | ORAL_TABLET | Freq: Four times a day (QID) | ORAL | Status: DC | PRN

## 2016-01-03 NOTE — ED Notes (Signed)
This RN stayed at the patient's bedside throughout the duration of the full 15 minutes after starting blood transfusion of 2nd unit. No adverse reaction suspected.

## 2016-01-03 NOTE — Progress Notes (Signed)
[facility] PROGRESS NOTE  [name] [id number] DOB: 12-12-1931 DOA: 01/03/2016 PCP: [name], MD HPI/Subjective: 82 y.o.WM PMHx CAD native artery, Chronic Diastolic CHF, HTN, RBBB, Atrial Flutter/Fibrillation, NSTEMI, Stage III CKD, DM Type 2 with neuropathy, OSA on CPAP, and previous Diverticular Bleeding  Presents to the ED with complaints of passing dark stools and then BRBPR for the past 3-4 days. He denies any ABD Pain, he does report beginning to have chest pain since last night. He was evaluated in the ED and was found to have a Hb level of 7.2, and on discharge last hospitalization in 11/2015 his hb was 10.5. He was hospitalized for Diverticular bleeding at that time and had to be transfused. The EDP Dr Dina Rich consulted GI Dr Pincus Sanes who is to see the patient this AM.   HPI/Subjective: 2/19 A/O 4, NAD. States stopped all anticoagulation except baby aspirin last month when he was seen for diverticular bleed. Negative N/V. Patient just returned from having a BM which he said appeared to be normal in color.     Assessment/Plan: Acute GI bleed/acute blood loss anemia -Continue Protonix 40 mg BID -See CHF -Continue H/H TID  DM type II -Hemoglobin A1c pending -Sensitive SSI -2/4 lipid panel within ADA guidelines  Combined chronic Systolic and Diastolic CHF ([name])/NSTEMI/A-Fib-Flutter -Strict in and out -Daily weight -Continue normal saline 40 mL/hr -Tranfuse for hemoglobin <8 -2/19 Transfuse 1 unit PRBC  Atrial fibrillation ([name]) - Hx had Ablation -No anticoagulants, to include aspirin until cleared by GI  Pulmonary hypertension -See CHF  CKD (chronic kidney disease) stage 3,  - avoid all nephrotoxic medication     Code Status: Full Family Communication: None Disposition Plan: Resolution GI bleed   Consultants: Dr.Henry L Danis III GI   Procedures: 1/3 echo cardiac  Echocardiogram;Left ventricle: Hypokinesis of the mid anterolateral ,anterior and distal lateral and anterior walls.-LVEF= 50% to 55%. -(grade 2 diastolic dysfunction).  - Mitral valve: mild to moderate regurgitation.- Left atrium:moderately dilated. - Tricuspid valve:moderateregurgitation.- Pulmonary arteries: PA peak pressure: 51 mm Hg (S). 2/19 transfused 1 unit PRBC 2/19 RBC scan; negative site of bleeding identified  Cultures NA  Antibiotics: NA  DVT prophylaxis SCD    Objective: Filed Vitals:   01/03/16 1357 01/03/16 1708 01/03/16 1711 01/03/16 1926  BP: 126/60 140/62 140/62 135/58  Pulse: 55 65 64 61  Temp: 97.8 F (36.6 C)  97.8 F (36.6 C) 97.9 F (36.6 C)  TempSrc: Oral   Oral  Resp: 16 16  16   Height:   5\' 9"  (1.753 m)   Weight:   84.4 kg (186 lb 1.1 oz)   SpO2: 98% 100% 100% 100%    Intake/Output Summary (Last 24 hours) at 01/03/16 2108 Last data filed at 01/03/16 1800  Gross per 24 hour  Intake 3055.92 ml  Output      0 ml  Net 3055.92 ml   Filed Weights   01/03/16 1711  Weight: 84.4 kg (186 lb 1.1 oz)     Exam: General:  A/O 4, NAD., No acute respiratory distress Eyes: Negative headache, eye pain, double vision,negative scleral hemorrhage ENT: Negative Runny nose, negative ear pain, negative tinnitus, negative gingival bleeding, Neck:  Negative scars, masses, torticollis, lymphadenopathy, JVD Lungs: Clear to auscultation bilaterally without wheezes or crackles Cardiovascular: Regular rate and rhythm without murmur gallop or rub normal S1 and S2 Abdomen:negative abdominal pain, negative dysphagia, nondistended, positive soft, bowel sounds, no rebound, no ascites, no appreciable mass Extremities: No significant cyanosis, clubbing,  or edema bilateral lower extremities Psychiatric:  Negative depression, negative anxiety, negative fatigue, negative mania  Neurologic:  Cranial nerves II through XII intact, tongue/uvula midline, all extremities muscle  strength 5/5, sensation intact throughout, negative dysarthria, negative expressive aphasia, negative receptive aphasia.    Data Reviewed: Basic Metabolic Panel:  Recent Labs Lab 01/03/16 0235 01/03/16 0438  NA 142 140  K 3.7 3.8  CL 100* 102  CO2 28 30  GLUCOSE 199* 163*  BUN 35* 34*  CREATININE 2.26* 2.16*  CALCIUM 9.3 9.0   Liver Function Tests:  Recent Labs Lab 01/03/16 0235  AST 22  ALT 18  ALKPHOS 57  BILITOT 0.5  PROT 6.2*  ALBUMIN 3.0*   No results for input(s): LIPASE, AMYLASE in the last 168 hours. No results for input(s): AMMONIA in the last 168 hours. CBC:  Recent Labs Lab 01/03/16 0235 01/03/16 0438 01/03/16 1712 01/03/16 1948  WBC 7.0 7.2  --   --   HGB 7.2* 7.1* 9.4* 9.1*  HCT 23.2* 22.6* 29.8* 29.0*  MCV 93.9 94.2  --   --   PLT 189 182  --   --    Cardiac Enzymes: No results for input(s): CKTOTAL, CKMB, CKMBINDEX, TROPONINI in the last 168 hours. BNP (last 3 results) No results for input(s): BNP in the last 8760 hours.  ProBNP (last 3 results) No results for input(s): PROBNP in the last 8760 hours.  CBG: No results for input(s): GLUCAP in the last 168 hours.  Recent Results (from the past 240 hour(s))  MRSA PCR Screening     Status: None   Collection Time: 01/03/16  5:57 PM  Result Value Ref Range Status   MRSA by PCR NEGATIVE NEGATIVE Final    Comment:        The GeneXpert MRSA Assay (FDA approved for NASAL specimens only), is one component of a comprehensive MRSA colonization surveillance program. It is not intended to diagnose MRSA infection nor to guide or monitor treatment for MRSA infections.      Studies: Dg Chest 2 View  01/03/2016  CLINICAL DATA:  82 year old male with chest pain EXAM: CHEST  2 VIEW COMPARISON:  Radiograph dated 12/18/2015 FINDINGS: Two views of the chest demonstrate stable minimal left lung base linear atelectasis. There is no focal consolidation, pleural effusion, or pneumothorax. Stable  cardiac silhouette. Median sternotomy wires and CABG vascular clips. No acute osseous pathology. IMPRESSION: No active cardiopulmonary disease. Electronically Signed   By: Anner Crete M.D.   On: 01/03/2016 03:02   Nm Gi Blood Loss  01/03/2016  CLINICAL DATA:  History of GI diverticular bleed. GI bleeding. Dark blood in the stools noted 4 days ago. EXAM: NUCLEAR MEDICINE GASTROINTESTINAL BLEEDING SCAN TECHNIQUE: Sequential abdominal images were obtained following intravenous administration of Tc-50m labeled red blood cells. RADIOPHARMACEUTICALS:  25.0 mCi Tc-53m in-vitro labeled red cells. COMPARISON:  09/13/2008 FINDINGS: Early level activity is identified. Delayed images are performed, demonstrating no site of active bleeding. IMPRESSION: A site of active bleeding is not identified. Electronically Signed   By: Nolon Nations M.D.   On: 01/03/2016 16:27    Scheduled Meds: . sodium chloride   Intravenous Once  . insulin aspart  0-9 Units Subcutaneous 6 times per day  . isosorbide mononitrate  60 mg Oral BID WC  . pantoprazole (PROTONIX) IV  40 mg Intravenous Q12H  . ranolazine  500 mg Oral BID   Continuous Infusions: . sodium chloride 40 mL (01/03/16 1708)    Principal Problem:  GI bleed Active Problems:   CKD (chronic kidney disease) stage 3, GFR 30-59 ml/min   Acute blood loss anemia   Acute on chronic diastolic (congestive) heart failure ([name])   Diabetes mellitus ([name])   Diastolic CHF ([name])   Atrial fibrillation ([name])   Chronic combined systolic and diastolic CHF (congestive heart failure) ([name])   Pulmonary hypertension ([name])   CKD (chronic kidney disease), stage III    Time spent: 35 minutes    Corbet Hanley, [facility] Pager [phone]. If 7PM-7AM, please contact night-coverage at [email or web address], password [facility] 01/03/2016, 9:08 PM  LOS: 0 days    Care during the described time interval was provided by me .  I have reviewed this patient's available data,  including medical history, events of note, physical examination, and all test results as part of my evaluation. I have personally reviewed and interpreted all radiology studies.   Dia Crawford, MD [phone] Pager

## 2016-01-03 NOTE — ED Notes (Signed)
Pt being transferred to nuclear med

## 2016-01-03 NOTE — ED Notes (Signed)
Doctor  At the bedside

## 2016-01-03 NOTE — ED Notes (Signed)
Care handoff to [place], [place]

## 2016-01-03 NOTE — ED Provider Notes (Signed)
CSN: [id number]     Arrival date & time 01/03/16  0156 History  By signing my name below, I, [name], attest that this documentation has been prepared under the direction and in the presence of Merryl Hacker, MD. Electronically Signed: Virgel Bouquet, ED Scribe. 01/03/2016. 3:39 AM.   Chief Complaint  Patient presents with  . GI Bleeding  . Chest Pain   The history is provided by the patient. No language interpreter was used.   HPI Comments: [name] is a 82 y.o. male with an hx of diverticulosis and blood transfusions who presents to the Emergency Department complaining of ongoing, intermittent melena and BRBPR that last occurred yesterday. Ongoing with melena and intermittent bright red blood for last 3-4 days. Patient reports that he was evaluated in the [facility] ED 2.5 months ago where he was diagnosed with a diverticular bleed. Per patient, he was admitted to the hospital for these symptoms at that time. He states that he has been seen by a GI specialist for these symptoms with [place].  Patient with an hx of HTN, HLN, CAD, angina, NSTEMI, atrial fibrillation, and CABG also complains of completely resolved, moderate CP onset earlier tonight after waking. Patient reports that he went to bed feeling normal but woke 30 minutes later followed by onset of CP with gradually worsened. He took 1x NTG with relief of his CP. He endorses associated intermittent dizziness and SOB with exertion today. He denies abdominal pain and CP currently.  Patient was admitted approximate 3 months ago and found to have a diverticular bleed requiring clipping. Also noted to have gastritis. He is not currently on any blood thinners except for a baby aspirin daily.   Past Medical History  Diagnosis Date  . Anemia     Secondary to acute blood loss  . Hypertension   . Hyperlipidemia   . RBBB (right bundle branch block)   . Age-related macular degeneration, wet, both eyes ([place])   .  Carotid artery disease ([place])     Doppler 09/18/2009 - 49% bilateral stenoses  . Personal history of colonic polyps   . Diverticulosis of colon with hemorrhage 2009    several unit diverticular bleed   . Erectile dysfunction     Mild  . Atrial flutter [facility]) 07/2010    September, 2011   Hospital with PNA and cath done.Marland KitchenMarland KitchenCoumadin.  Atrial flutter ablation planned, but  pt. then had atrial fibrillation,/outpatient conversion 09/08/10..NSR..plan to follow..Dr. Caryl Comes  . Gout   . Skin cancer     R lower leg, per derm 2012  . Atrial fibrillation ([place])     Consideration was given for atrial flutter ablation, but patient developed atrial fibrillation. Cardioversion was done. Dr. Caryl Comes decided to watch him clinically. November, 2011  . Carotid artery disease ([name])     49% bilateral, Doppler, November, 2010  . CAD (coronary artery disease)     Catheterization, September, 2011,  grafts patent from redo CABG,, medical therapy of coronary disease, consideration to proceeding with atrial flutter ablation  . Ejection fraction     EF 60%, echo, 2009  //   EF 65%, echo, September, 2011  . Mitral regurgitation     Mild, echo, September, 2011  . Shoulder pain     "positional; better now" (11/13/2015)  . Anginal pain ([place])   . PNA (pneumonia) 9/11    NSTEMI at [facility] with repeat cath, rec medical mgmt   . NSTEMI (non-ST elevated myocardial infarction) ([place]) 07/2010  at [facility] with repeat cath, rec medical mgmt   . OSA on CPAP   . Type II diabetes mellitus ([place])   . Diabetic peripheral neuropathy ([place])   . History of blood transfusion "several"    "related to diverticular bleeding"  . Arthritis     "mild in hands, knees, ankles" (11/13/2015)  . Chronic kidney disease (CKD), stage III (moderate)    Past Surgical History  Procedure Laterality Date  . Cardiac catheterization  2006    Nuclear..slight lateral ischemia..medical therapy  . Cardiac catheterization  08/04/2010    grafts patent from redo  CABG...medical Rx and ablate Atrial flutter (LV not injected)   . Doppler echocardiography  08/2008    EF 60%  . Doppler echocardiography  08/02/2010    65-70%  . Doppler echocardiography  07/2010    MR mild  . Coronary artery bypass graft  1995; 2006    "X 3; X3"  . Colonoscopy w/ polypectomy    . Cataract extraction w/ intraocular lens  implant, bilateral Bilateral   . Vasectomy    . Tonsillectomy    . Carotid endarterectomy Right 1994  . Coronary artery bypass graft  1995, 2006  . Esophagogastroduodenoscopy N/A 06/19/2014    Procedure: ESOPHAGOGASTRODUODENOSCOPY (EGD);  Surgeon: Jerene Bears, MD;  Location: [facility] ENDOSCOPY;  Service: Endoscopy;  Laterality: N/A;  . Left heart catheterization with coronary/graft angiogram N/A 06/11/2014    Procedure: LEFT HEART CATHETERIZATION WITH Beatrix Fetters;  Surgeon: Sinclair Grooms, MD;  Location: [facility] CATH LAB;  Service: Cardiovascular;  Laterality: N/A;  . Percutaneous coronary stent intervention (pci-s) N/A 06/13/2014    Procedure: PERCUTANEOUS CORONARY STENT INTERVENTION (PCI-S);  Surgeon: Sinclair Grooms, MD;  Location: [facility] CATH LAB;  Service: Cardiovascular;  Laterality: N/A;  . Left heart cath N/A 06/14/2014    Procedure: LEFT HEART CATH;  Surgeon: Sinclair Grooms, MD;  Location: [facility] CATH LAB;  Service: Cardiovascular;  Laterality: N/A;  . Laparoscopic cholecystectomy  2008  . Skin cancer excision Left 10/2015    calf  . Skin cancer excision Right 2014?    chest  . Cardioversion  ~ 2010  . Colonoscopy N/A 11/14/2015    Procedure: COLONOSCOPY;  Surgeon: Manus Gunning, MD;  Location: [place];  Service: Gastroenterology;  Laterality: N/A;  . Esophagogastroduodenoscopy N/A 11/14/2015    Procedure: ESOPHAGOGASTRODUODENOSCOPY (EGD);  Surgeon: Manus Gunning, MD;  Location: [place];  Service: Gastroenterology;  Laterality: N/A;  . Flexible sigmoidoscopy N/A 11/16/2015    Procedure: FLEXIBLE SIGMOIDOSCOPY;   Surgeon: Manus Gunning, MD;  Location: [place];  Service: Gastroenterology;  Laterality: N/A;   Family History  Problem Relation Age of Onset  . Kidney disease Mother     Kidney failure  . Stroke Mother   . Diabetes Mother   . Heart disease Father     MI  . Arthritis Sister   . Cancer Sister     Throat  . Heart disease Sister     MI  . Diabetes Sister   . Prostate cancer Neg Hx   . Colon cancer Neg Hx    Social History  Substance Use Topics  . Smoking status: Former Smoker -- 1.00 packs/day for 8 years    Types: Cigarettes  . Smokeless tobacco: Never Used     Comment: "quit smoking cigarettes in 1958"  . Alcohol Use: No    Review of Systems  Respiratory: Positive for shortness of breath.   Cardiovascular: Negative  for chest pain (resolved PTA).  Gastrointestinal: Positive for blood in stool. Negative for abdominal pain.  Neurological: Positive for dizziness.  All other systems reviewed and are negative.     Allergies  Review of patient's allergies indicates no known allergies.  Home Medications   Prior to Admission medications   Medication Sig Start Date End Date Taking? Authorizing Provider  aspirin 81 MG tablet Take 81 mg by mouth daily.   Yes Historical Provider, MD  BD INSULIN SYRINGE ULTRAFINE 31G X 5/16" 0.3 ML MISC USE DAILY AS INSTRUCTED 12/02/15  Yes Tonia Ghent, MD  Cholecalciferol (VITAMIN D) 1000 UNITS capsule Take 1,000 Units by mouth daily.    Yes Historical Provider, MD  colchicine 0.6 MG tablet Take 1 tablet (0.6 mg total) by mouth daily as needed (gout flare up). 11/30/15  Yes Tonia Ghent, MD  fish oil-omega-3 fatty acids 1000 MG capsule Take 1 g by mouth daily.    Yes Historical Provider, MD  furosemide (LASIX) 80 MG tablet Take 1 tablet (80 mg total) by mouth daily. 11/30/15  Yes Tonia Ghent, MD  insulin glargine (LANTUS) 100 UNIT/ML injection Inject 0.15 mLs (15 Units total) into the skin at bedtime. 11/30/15  Yes Tonia Ghent, MD  isosorbide mononitrate (IMDUR) 30 MG 24 hr tablet TAKE 2 TABLETS TWICE A DAY 08/24/15  Yes Carlena Bjornstad, MD  magnesium oxide (MAG-OX) 400 MG tablet Take 400 mg by mouth daily.    Yes Historical Provider, MD  Multiple Vitamin (MULTIVITAMIN WITH MINERALS) TABS Take 1 tablet by mouth daily.   Yes Historical Provider, MD  nitroGLYCERIN (NITROSTAT) 0.4 MG SL tablet Place 1 tablet (0.4 mg total) under the tongue every 5 (five) minutes as needed for chest pain. 09/04/15  Yes [name], MD  Potassium Gluconate (K-99) 595 MG CAPS Take 595 mg by mouth daily.    Yes Historical Provider, MD  ranolazine (RANEXA) 500 MG 12 hr tablet Take 1 tablet (500 mg total) by mouth 2 (two) times daily. 09/28/15  Yes [name], MD  rosuvastatin (CRESTOR) 20 MG tablet Take 1 tablet (20 mg total) by mouth daily. 09/28/15  Yes Carlena Bjornstad, MD  TOPROL XL 50 MG 24 hr tablet TAKE 1 TABLET DAILY WITH A MEAL OR IMMEDIATELY FOLLOWING A MEAL 10/06/15  Yes Carlena Bjornstad, MD  traMADol (ULTRAM) 50 MG tablet Take 1 tablet (50 mg total) by mouth every 6 (six) hours as needed. ARTHRITIS Patient taking differently: Take 100 mg by mouth 2 (two) times daily. Scheduled (for arthritis pain) 05/24/15  Yes Tonia Ghent, MD   BP 120/57 mmHg  Pulse 71  Temp(Src) 99 F (37.2 C) (Oral)  Resp 22  SpO2 100% Physical Exam  Constitutional: He is oriented to person, place, and time. He appears well-developed and well-nourished. No distress.  HENT:  Head: Normocephalic and atraumatic.  Cardiovascular: Normal rate, regular rhythm and normal heart sounds.   No murmur heard. Pulmonary/Chest: Effort normal and breath sounds normal. No respiratory distress. He has no wheezes.  Abdominal: Soft. Bowel sounds are normal. There is no tenderness. There is no rebound and no guarding.  Genitourinary:  Flaky red blood noted on rectal exam, no active bleeding, normal rectal tone, no masses noted  Musculoskeletal:  Trace  bilateral lower extremity edema  Neurological: He is alert and oriented to person, place, and time.  Skin: Skin is warm and dry.  Psychiatric: He has a normal mood and affect.  Nursing note and vitals reviewed.   ED Course  Procedures   CRITICAL CARE Performed by: Merryl Hacker   Total critical care time: 30 minutes  Critical care time was exclusive of separately billable procedures and treating other patients.  Critical care was necessary to treat or prevent imminent or life-threatening deterioration.  Critical care was time spent personally by me on the following activities: development of treatment plan with patient and/or surrogate as well as nursing, discussions with consultants, evaluation of patient's response to treatment, examination of patient, obtaining history from patient or surrogate, ordering and performing treatments and interventions, ordering and review of laboratory studies, ordering and review of radiographic studies, pulse oximetry and re-evaluation of patient's condition.   DIAGNOSTIC STUDIES: Oxygen Saturation is 97% on RA, normal by my interpretation.    COORDINATION OF CARE: 2:35 AM Discussed treatment plan with pt at bedside and pt agreed to plan.   Labs Review Labs Reviewed  CBC - Abnormal; Notable for the following:    RBC 2.47 (*)    Hemoglobin 7.2 (*)    HCT 23.2 (*)    RDW 15.6 (*)    All other components within normal limits  COMPREHENSIVE METABOLIC PANEL - Abnormal; Notable for the following:    Chloride 100 (*)    Glucose, Bld 199 (*)    BUN 35 (*)    Creatinine, Ser 2.26 (*)    Total Protein 6.2 (*)    Albumin 3.0 (*)    GFR calc non Af Amer 26 (*)    GFR calc Af Amer 30 (*)    All other components within normal limits  POC OCCULT BLOOD, ED - Abnormal; Notable for the following:    Fecal Occult Bld POSITIVE (*)    All other components within normal limits  I-STAT TROPOININ, ED  TYPE AND SCREEN  PREPARE RBC (CROSSMATCH)     Imaging Review Dg Chest 2 View  01/03/2016  CLINICAL DATA:  82 year old male with chest pain EXAM: CHEST  2 VIEW COMPARISON:  Radiograph dated 12/18/2015 FINDINGS: Two views of the chest demonstrate stable minimal left lung base linear atelectasis. There is no focal consolidation, pleural effusion, or pneumothorax. Stable cardiac silhouette. Median sternotomy wires and CABG vascular clips. No acute osseous pathology. IMPRESSION: No active cardiopulmonary disease. Electronically Signed   By: Anner Crete M.D.   On: 01/03/2016 03:02   I have personally reviewed and evaluated these images and lab results as part of my medical decision-making.   EKG Interpretation   Date/Time:  Sunday January 03 2016 02:23:32 EST Ventricular Rate:  70 PR Interval:  147 QRS Duration: 161 QT Interval:  504 QTC Calculation: 544 R Axis:   -60 Text Interpretation:  Sinus or ectopic atrial rhythm RBBB and LAFB  Abnormal T, consider ischemia, lateral leads Confirmed by HORTON  MD,  COURTNEY ([id number]) on 01/03/2016 2:58:19 AM      MDM   Final diagnoses:  Acute GI bleeding  Angina at rest [facility])    Patient presents with chest pain in the setting of what is likely an acute GI bleed. History of diverticular bleed. Currently he is symptom free after nitroglycerin. History of coronary artery disease. EKG is nonischemic and initial troponin is negative. Patient is hemodynamically stable. Hemoglobin is 7.2, from a baseline of 10.5. Patient was typed and screened and 1 unit of blood was ordered. Suspect patient's angina is likely secondary to acute blood loss anemia. Discussed with Dr. Loletha Carrow, GI, who will evaluate patient  later this morning. Per the blood bank, patient has antibodies and it will take some time to get blood matched for him. He is currently hemodynamically stable. Discussed with the admitting hospitalist. Will admit to the step down unit.  I personally performed the services described in this  documentation, which was scribed in my presence. The recorded information has been reviewed and is accurate.    Merryl Hacker, MD 01/03/16 [phone]

## 2016-01-03 NOTE — ED Notes (Signed)
Pt in EMS, has 2 different complaints. CP that started, took 1NTG and pain went from 3/10 to 1/10. Pt also reporting dark stools present past few days, hx GI bleeding was in hospital recently for same issue & had to get several units of blood. VSS, A/OX4

## 2016-01-03 NOTE — Consult Note (Signed)
Consultation  Referring Provider:  [facility] hospital[facility] Primary Care Physician:  [name], MD Primary Gastroenterologist:  Dr.Armbruster  Reason for Consultation:  Fabienne Bruns Bleed  HPI: [name] is a 82 y.o. male  admitted earlier this morning through the emergency room with recurrent acute GI bleed and transient chest tightness. Patient has history of coronary artery disease is status post CABG and redo CABG in 2006, history of atrial fibrillation, chronic kidney disease, adult-onset diabetes mellitus and recurrent diverticular bleeding. Patient has had at least 5 diverticular bleeds in his lifetime requiring hospitalization. He was admitted on 11/13/2015 with acute diverticular hemorrhage and at that time was anticoagulated with Brilinta. Unfortunately he also sustained an NSTEMI during that admission due to acute bleed. He underwent colonoscopy on 11/13/2016 per Dr. Havery Moros and found to have active bleeding diverticulum in the left colon which was hemoclipped and injected with epinephrine, also noted large hemorrhoids. He also had EGD on that same day showing mild gastritis otherwise negative exam. Unfortunately he re-bled on January 2 to have active oozing in the proximal descending colon. There were 2 clips noted and 3 additional clips placed. He had no further active bleeding thereafter. Decision was made to leave him off of anticoagulation. Hemoglobin on discharge was 10.5. Patient remains on baby aspirin once daily. He states that he started noticing dark blood in his stools about 4 days ago and had 2-3 bowel movements daily since then all-appearing similar with dark blood and possible clots. He says he doesn't see colors very well and stools have remained formed. He has noted some increased weakness with ambulation. Last evening he was awakened in the middle of the night with chest pressure which became worse, he took nitroglycerin which helped and then chest tightness recurred and he  called EMS. Chest pain resolved while being transported and has not recurred. Hemoglobin was 7.2 on arrival to Hospital. He is being transfused second unit of blood currently and is hemodynamically stable. He has not had any stools since 4 AM.  Past Medical History  Diagnosis Date  . Anemia     Secondary to acute blood loss  . Hypertension   . Hyperlipidemia   . RBBB (right bundle branch block)   . Age-related macular degeneration, wet, both eyes ([place])   . Carotid artery disease ([place])     Doppler 09/18/2009 - 49% bilateral stenoses  . Personal history of colonic polyps   . Diverticulosis of colon with hemorrhage 2009    several unit diverticular bleed   . Erectile dysfunction     Mild  . Atrial flutter [facility]) 07/2010    September, 2011   Hospital with PNA and cath done.Marland KitchenMarland KitchenCoumadin.  Atrial flutter ablation planned, but  pt. then had atrial fibrillation,/outpatient conversion 09/08/10..NSR..plan to follow..Dr. Caryl Comes  . Gout   . Skin cancer     R lower leg, per derm 2012  . Atrial fibrillation ([place])     Consideration was given for atrial flutter ablation, but patient developed atrial fibrillation. Cardioversion was done. Dr. Caryl Comes decided to watch him clinically. November, 2011  . Carotid artery disease ([name])     49% bilateral, Doppler, November, 2010  . CAD (coronary artery disease)     Catheterization, September, 2011,  grafts patent from redo CABG,, medical therapy of coronary disease, consideration to proceeding with atrial flutter ablation  . Ejection fraction     EF 60%, echo, 2009  //   EF 65%, echo, September, 2011  . Mitral regurgitation  Mild, echo, September, 2011  . Shoulder pain     "positional; better now" (11/13/2015)  . Anginal pain ([place])   . PNA (pneumonia) 9/11    NSTEMI at [facility] with repeat cath, rec medical mgmt   . NSTEMI (non-ST elevated myocardial infarction) ([place]) 07/2010    at [facility] with repeat cath, rec medical mgmt   . OSA on CPAP   . Type II diabetes  mellitus ([place])   . Diabetic peripheral neuropathy ([place])   . History of blood transfusion "several"    "related to diverticular bleeding"  . Arthritis     "mild in hands, knees, ankles" (11/13/2015)  . Chronic kidney disease (CKD), stage III (moderate)     Past Surgical History  Procedure Laterality Date  . Cardiac catheterization  2006    Nuclear..slight lateral ischemia..medical therapy  . Cardiac catheterization  08/04/2010    grafts patent from redo CABG...medical Rx and ablate Atrial flutter (LV not injected)   . Doppler echocardiography  08/2008    EF 60%  . Doppler echocardiography  08/02/2010    65-70%  . Doppler echocardiography  07/2010    MR mild  . Coronary artery bypass graft  1995; 2006    "X 3; X3"  . Colonoscopy w/ polypectomy    . Cataract extraction w/ intraocular lens  implant, bilateral Bilateral   . Vasectomy    . Tonsillectomy    . Carotid endarterectomy Right 1994  . Coronary artery bypass graft  1995, 2006  . Esophagogastroduodenoscopy N/A 06/19/2014    Procedure: ESOPHAGOGASTRODUODENOSCOPY (EGD);  Surgeon: Jerene Bears, MD;  Location[facility] ENDOSCOPY;  Service: Endoscopy;  Laterality: N/A;  . Left heart catheterization with coronary/graft angiogram N/A 06/11/2014    Procedure: LEFT HEART CATHETERIZATION WITH Beatrix Fetters;  Surgeon: Sinclair Grooms, MD;  Location: [facility] CATH LAB;  Service: Cardiovascular;  Laterality: N/A;  . Percutaneous coronary stent intervention (pci-s) N/A 06/13/2014    Procedure: PERCUTANEOUS CORONARY STENT INTERVENTION (PCI-S);  Surgeon: Sinclair Grooms, MD;  Location: [facility] CATH LAB;  Service: Cardiovascular;  Laterality: N/A;  . Left heart cath N/A 06/14/2014    Procedure: LEFT HEART CATH;  Surgeon: Sinclair Grooms, MD;  Location: [facility] CATH LAB;  Service: Cardiovascular;  Laterality: N/A;  . Laparoscopic cholecystectomy  2008  . Skin cancer excision Left 10/2015    calf  . Skin cancer excision Right 2014?    chest  .  Cardioversion  ~ 2010  . Colonoscopy N/A 11/14/2015    Procedure: COLONOSCOPY;  Surgeon: Manus Gunning, MD;  Location: [place];  Service: Gastroenterology;  Laterality: N/A;  . Esophagogastroduodenoscopy N/A 11/14/2015    Procedure: ESOPHAGOGASTRODUODENOSCOPY (EGD);  Surgeon: Manus Gunning, MD;  Location: [place];  Service: Gastroenterology;  Laterality: N/A;  . Flexible sigmoidoscopy N/A 11/16/2015    Procedure: FLEXIBLE SIGMOIDOSCOPY;  Surgeon: Manus Gunning, MD;  Location: [place];  Service: Gastroenterology;  Laterality: N/A;    Prior to Admission medications   Medication Sig Start Date End Date Taking? Authorizing Provider  aspirin 81 MG tablet Take 81 mg by mouth daily.   Yes Historical Provider, MD  BD INSULIN SYRINGE ULTRAFINE 31G X 5/16" 0.3 ML MISC USE DAILY AS INSTRUCTED 12/02/15  Yes Tonia Ghent, MD  Cholecalciferol (VITAMIN D) 1000 UNITS capsule Take 1,000 Units by mouth daily.    Yes Historical Provider, MD  colchicine 0.6 MG tablet Take 1 tablet (0.6 mg total) by mouth daily as needed (gout  flare up). 11/30/15  Yes Tonia Ghent, MD  fish oil-omega-3 fatty acids 1000 MG capsule Take 1 g by mouth daily.    Yes Historical Provider, MD  furosemide (LASIX) 80 MG tablet Take 1 tablet (80 mg total) by mouth daily. 11/30/15  Yes Tonia Ghent, MD  insulin glargine (LANTUS) 100 UNIT/ML injection Inject 0.15 mLs (15 Units total) into the skin at bedtime. 11/30/15  Yes Tonia Ghent, MD  isosorbide mononitrate (IMDUR) 30 MG 24 hr tablet TAKE 2 TABLETS TWICE A DAY 08/24/15  Yes Carlena Bjornstad, MD  magnesium oxide (MAG-OX) 400 MG tablet Take 400 mg by mouth daily.    Yes Historical Provider, MD  Multiple Vitamin (MULTIVITAMIN WITH MINERALS) TABS Take 1 tablet by mouth daily.   Yes Historical Provider, MD  nitroGLYCERIN (NITROSTAT) 0.4 MG SL tablet Place 1 tablet (0.4 mg total) under the tongue every 5 (five) minutes as needed for chest pain.  09/04/15  Yes [name], MD  Potassium Gluconate (K-99) 595 MG CAPS Take 595 mg by mouth daily.    Yes Historical Provider, MD  ranolazine (RANEXA) 500 MG 12 hr tablet Take 1 tablet (500 mg total) by mouth 2 (two) times daily. 09/28/15  Yes [name], MD  rosuvastatin (CRESTOR) 20 MG tablet Take 1 tablet (20 mg total) by mouth daily. 09/28/15  Yes Carlena Bjornstad, MD  TOPROL XL 50 MG 24 hr tablet TAKE 1 TABLET DAILY WITH A MEAL OR IMMEDIATELY FOLLOWING A MEAL 10/06/15  Yes Carlena Bjornstad, MD  traMADol (ULTRAM) 50 MG tablet Take 1 tablet (50 mg total) by mouth every 6 (six) hours as needed. ARTHRITIS Patient taking differently: Take 100 mg by mouth 2 (two) times daily. Scheduled (for arthritis pain) 05/24/15  Yes Tonia Ghent, MD    Current Facility-Administered Medications  Medication Dose Route Frequency Provider Last Rate Last Dose  . 0.9 %  sodium chloride infusion   Intravenous Continuous Theressa Millard, MD 75 mL/hr at 01/03/16 0547    . 0.9 %  sodium chloride infusion   Intravenous Once Allie Bossier, MD      . acetaminophen (TYLENOL) tablet 650 mg  650 mg Oral Q6H PRN Theressa Millard, MD       Or  . acetaminophen (TYLENOL) suppository 650 mg  650 mg Rectal Q6H PRN Theressa Millard, MD      . HYDROmorphone (DILAUDID) injection 0.5-1 mg  0.5-1 mg Intravenous Q3H PRN Theressa Millard, MD      . isosorbide mononitrate (IMDUR) 24 hr tablet 60 mg  60 mg Oral BID WC Theressa Millard, MD   60 mg at 01/03/16 0801  . ondansetron (ZOFRAN) tablet 4 mg  4 mg Oral Q6H PRN Theressa Millard, MD       Or  . ondansetron (ZOFRAN) injection 4 mg  4 mg Intravenous Q6H PRN Harvette Evonnie Dawes, MD      . oxyCODONE (Oxy IR/ROXICODONE) immediate release tablet 5 mg  5 mg Oral Q4H PRN Theressa Millard, MD      . pantoprazole (PROTONIX) injection 40 mg  40 mg Intravenous Q12H Harvette Evonnie Dawes, MD      . ranolazine (RANEXA) 12 hr tablet 500 mg  500 mg Oral BID Theressa Millard,  MD       Current Outpatient Prescriptions  Medication Sig Dispense Refill  . aspirin 81 MG tablet Take 81 mg by mouth daily.    [name]  BD INSULIN SYRINGE ULTRAFINE 31G X 5/16" 0.3 ML MISC USE DAILY AS INSTRUCTED 100 each 2  . Cholecalciferol (VITAMIN D) 1000 UNITS capsule Take 1,000 Units by mouth daily.     . colchicine 0.6 MG tablet Take 1 tablet (0.6 mg total) by mouth daily as needed (gout flare up).    . fish oil-omega-3 fatty acids 1000 MG capsule Take 1 g by mouth daily.     . furosemide (LASIX) 80 MG tablet Take 1 tablet (80 mg total) by mouth daily.    . insulin glargine (LANTUS) 100 UNIT/ML injection Inject 0.15 mLs (15 Units total) into the skin at bedtime. 10 mL   . isosorbide mononitrate (IMDUR) 30 MG 24 hr tablet TAKE 2 TABLETS TWICE A DAY 360 tablet 1  . magnesium oxide (MAG-OX) 400 MG tablet Take 400 mg by mouth daily.     . Multiple Vitamin (MULTIVITAMIN WITH MINERALS) TABS Take 1 tablet by mouth daily.    . nitroGLYCERIN (NITROSTAT) 0.4 MG SL tablet Place 1 tablet (0.4 mg total) under the tongue every 5 (five) minutes as needed for chest pain. 100 tablet 1  . Potassium Gluconate (K-99) 595 MG CAPS Take 595 mg by mouth daily.     . ranolazine (RANEXA) 500 MG 12 hr tablet Take 1 tablet (500 mg total) by mouth 2 (two) times daily. 90 tablet 3  . rosuvastatin (CRESTOR) 20 MG tablet Take 1 tablet (20 mg total) by mouth daily. 90 tablet 1  . TOPROL XL 50 MG 24 hr tablet TAKE 1 TABLET DAILY WITH A MEAL OR IMMEDIATELY FOLLOWING A MEAL 90 tablet 3  . traMADol (ULTRAM) 50 MG tablet Take 1 tablet (50 mg total) by mouth every 6 (six) hours as needed. ARTHRITIS (Patient taking differently: Take 100 mg by mouth 2 (two) times daily. Scheduled (for arthritis pain)) 360 tablet 1    Allergies as of 01/03/2016  . (No Known Allergies)    Family History  Problem Relation Age of Onset  . Kidney disease Mother     Kidney failure  . Stroke Mother   . Diabetes Mother   . Heart disease Father      MI  . Arthritis Sister   . Cancer Sister     Throat  . Heart disease Sister     MI  . Diabetes Sister   . Prostate cancer Neg Hx   . Colon cancer Neg Hx     Social History   Social History  . Marital Status: Widowed    Spouse Name: N/A  . Number of Children: 2  . Years of Education: N/A   Occupational History  . 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 Smoking status: Former Smoker -- 1.00 packs/day for 8 years    Types: Cigarettes  . Smokeless tobacco: Never Used     Comment: "quit smoking cigarettes in 1958"  . Alcohol Use: No  . Drug Use: No  . Sexual Activity: Yes   Other Topics Concern  . Not on file   Social History Narrative   From [place].  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  Widowed 12/2005.    Review of Systems: Pertinent positive and negative review of systems were noted in the above HPI section.  All other review of systems was otherwise negative.  Physical Exam: Vital signs in last 24 hours: Temp:  [97.8 F (  36.6 C)-99 F (37.2 C)] 98 F (36.7 C) (02/19 1135) Pulse Rate:  [58-100] 60 (02/19 1135) Resp:  [10-23] 16 (02/19 1135) BP: (99-140)/(50-76) 119/53 mmHg (02/19 1135) SpO2:  [96 %-100 %] 100 % (02/19 1135)   General:   Alert,  Well-developed, well-nourished,elderly WM  pleasant and cooperative in NAD Head:  Normocephalic and atraumatic. Eyes:  Sclera clear, no icterus.   Conjunctiva pale  Ears:  Normal auditory acuity. Nose:  No deformity, discharge,  or lesions. Mouth:  No deformity or lesions.   Neck:  Supple; no masses or thyromegaly. Lungs:  Clear throughout to auscultation.   No wheezes, crackles, or rhonchi. Heart:  Regular rate and rhythm; no murmurs, clicks, rubs,  or gallops.sternal scar Abdomen:  Soft,nontender, BS active,nonpalp mass or hsm.   Rectal:  Deferred  Msk:  Symmetrical without gross deformities. . Pulses:  Normal pulses  noted. Extremities:  Without clubbing or edema. Neurologic:  Alert and  oriented x4;  grossly normal neurologically. Skin:  Intact without significant lesions or rashes.. Psych:  Alert and cooperative. Normal mood and affect.  Intake/Output from previous day: 02/18 0701 - 02/19 0700 In: 335 [Blood:335] Out: -  Intake/Output this shift: Total I/O In: 335 [Blood:335] Out: -   Lab Results:  Recent Labs  01/03/16 0235 01/03/16 0438  WBC 7.0 7.2  HGB 7.2* 7.1*  HCT 23.2* 22.6*  PLT 189 182   BMET  Recent Labs  01/03/16 0235 01/03/16 0438  NA 142 140  K 3.7 3.8  CL 100* 102  CO2 28 30  GLUCOSE 199* 163*  BUN 35* 34*  CREATININE 2.26* 2.16*  CALCIUM 9.3 9.0   LFT  Recent Labs  01/03/16 0235  PROT 6.2*  ALBUMIN 3.0*  AST 22  ALT 18  ALKPHOS 57  BILITOT 0.5   PT/INR No results for input(s): LABPROT, INR in the last 72 hours. Hepatitis Panel No results for input(s): HEPBSAG, HCVAB, HEPAIGM, HEPBIGM in the last 72 hours.   IMPRESSION:   #72 82 year old white male with recurrent diverticular hemorrhage. Patient has history of multiple prior diverticular bleeds and was just hospitalized in early January 2017 with acute diverticular bleed requiring multiple transfusions and complicated by MI (this bleed occurred in the setting of anticoagulation which has since been discontinued). #2 anemia-acute on chronic secondary to acute GI blood loss #3 coronary artery disease status post remote CABG then redo 2006, status post non-ST MI January 2017 in setting of acute bleeding #4 angina-precipitated by #1 #5 chronic kidney disease- stage  3 #6 obstructive sleep apnea #7 atrial fibrillation/flutter  Plan; Keep nothing by mouth today Very important in this patient to keep his hemoglobin above 8, given heart disease and recent MI precipitated by GI bleed Place in stepdown Serial hemoglobins every 6 hours We will discuss, bleeding scan this afternoon and if positive  consider   IR angio with embolization vs colon with plans for endoclipping ( Angio was not done last admit because of concern for precipitating renal failure)     Amy Esterwood  01/03/2016, 11:50 AM

## 2016-01-03 NOTE — ED Notes (Signed)
Received call from blood bank, pt has antibodies so cross matching for blood transfusion will take extra time. Attempted to notify RN in charge. Admitting MD Arnoldo Morale aware and [place] MD aware.

## 2016-01-03 NOTE — ED Notes (Signed)
Report called  

## 2016-01-03 NOTE — ED Notes (Signed)
Care handoff to [place], [place]

## 2016-01-03 NOTE — ED Notes (Signed)
BP cuff repositioned at this time. PT reports no pain and has no requests.

## 2016-01-03 NOTE — H&P (Addendum)
[facility] Admission History and Physical       [name] [id number] DOB: Mar 31, 1932 DOA: 01/03/2016  Referring physician: EDP PCP: [name], MD  Specialists:   Chief Complaint: Rectal Bleeding  HPI: [name] is a 82 y.o. male with a history of CAD, Chronic Diastolic CHF, HTN, Stage III CKD, DM2 and previous Diverticular Bleeding who presents to the ED with complaints of passing dark stools and then BRBPR for the past 3-4 days.   He denies any ABD Pain, he does report beginning to have chest pain since last night.   He was evaluated in the ED and was found to have a Hb level of 7.2, and on discharge last hospitalization in 11/2015 his hb was 10.5.   He was hospitalized for Diverticular bleeding at that time and had to be transfused.   The EDP Dr Dina Rich consulted GI Dr Pincus Sanes who is to see the patient this AM.     Review of Systems: Constitutional: No Weight Loss, No Weight Gain, Night Sweats, Fevers, Chills, Dizziness, Light Headedness, Fatigue, or Generalized Weakness HEENT: No Headaches, Difficulty Swallowing,Tooth/Dental Problems,Sore Throat,  No Sneezing, Rhinitis, Ear Ache, Nasal Congestion, or Post Nasal Drip,  Cardio-vascular:  +Chest pain, Orthopnea, PND, Edema in Lower Extremities, Anasarca, Dizziness, Palpitations  Resp: No Dyspnea, No DOE, No Productive Cough, No Non-Productive Cough, No Hemoptysis, No Wheezing.    GI: No Heartburn, Indigestion, Abdominal Pain, Nausea, Vomiting, Diarrhea, Constipation, Hematemesis, +Hematochezia, +Melena, Change in Bowel Habits,  Loss of Appetite  GU: No Dysuria, No Change in Color of Urine, No Urgency or Urinary Frequency, No Flank pain.  Musculoskeletal: No Joint Pain or Swelling, No Decreased Range of Motion, No Back Pain.  Neurologic: No Syncope, No Seizures, Muscle Weakness, Paresthesia, Vision Disturbance or Loss, No Diplopia, No Vertigo, No Difficulty Walking,  Skin: No Rash or Lesions. Psych: No Change in  Mood or Affect, No Depression or Anxiety, No Memory loss, No Confusion, or Hallucinations  Past Medical History  Diagnosis Date  . Anemia     Secondary to acute blood loss  . Hypertension   . Hyperlipidemia   . RBBB (right bundle branch block)   . Age-related macular degeneration, wet, both eyes ([place])   . Carotid artery disease ([place])     Doppler 09/18/2009 - 49% bilateral stenoses  . Personal history of colonic polyps   . Diverticulosis of colon with hemorrhage 2009    several unit diverticular bleed   . Erectile dysfunction     Mild  . Atrial flutter [facility]) 07/2010    September, 2011   Hospital with PNA and cath done.Marland KitchenMarland KitchenCoumadin.  Atrial flutter ablation planned, but  pt. then had atrial fibrillation,/outpatient conversion 09/08/10..NSR..plan to follow..Dr. Caryl Comes  . Gout   . Skin cancer     R lower leg, per derm 2012  . Atrial fibrillation ([place])     Consideration was given for atrial flutter ablation, but patient developed atrial fibrillation. Cardioversion was done. Dr. Caryl Comes decided to watch him clinically. November, 2011  . Carotid artery disease ([name])     49% bilateral, Doppler, November, 2010  . CAD (coronary artery disease)     Catheterization, September, 2011,  grafts patent from redo CABG,, medical therapy of coronary disease, consideration to proceeding with atrial flutter ablation  . Ejection fraction     EF 60%, echo, 2009  //   EF 65%, echo, September, 2011  . Mitral regurgitation     Mild, echo, September, 2011  .  Shoulder pain     "positional; better now" (11/13/2015)  . Anginal pain ([place])   . PNA (pneumonia) 9/11    NSTEMI at [facility] with repeat cath, rec medical mgmt   . NSTEMI (non-ST elevated myocardial infarction) ([place]) 07/2010    at [facility] with repeat cath, rec medical mgmt   . OSA on CPAP   . Type II diabetes mellitus ([place])   . Diabetic peripheral neuropathy ([place])   . History of blood transfusion "several"    "related to diverticular bleeding"  . Arthritis      "mild in hands, knees, ankles" (11/13/2015)  . Chronic kidney disease (CKD), stage III (moderate)      Past Surgical History  Procedure Laterality Date  . Cardiac catheterization  2006    Nuclear..slight lateral ischemia..medical therapy  . Cardiac catheterization  08/04/2010    grafts patent from redo CABG...medical Rx and ablate Atrial flutter (LV not injected)   . Doppler echocardiography  08/2008    EF 60%  . Doppler echocardiography  08/02/2010    65-70%  . Doppler echocardiography  07/2010    MR mild  . Coronary artery bypass graft  1995; 2006    "X 3; X3"  . Colonoscopy w/ polypectomy    . Cataract extraction w/ intraocular lens  implant, bilateral Bilateral   . Vasectomy    . Tonsillectomy    . Carotid endarterectomy Right 1994  . Coronary artery bypass graft  1995, 2006  . Esophagogastroduodenoscopy N/A 06/19/2014    Procedure: ESOPHAGOGASTRODUODENOSCOPY (EGD);  Surgeon: Jerene Bears, MD;  Location: [facility] ENDOSCOPY;  Service: Endoscopy;  Laterality: N/A;  . Left heart catheterization with coronary/graft angiogram N/A 06/11/2014    Procedure: LEFT HEART CATHETERIZATION WITH Beatrix Fetters;  Surgeon: Sinclair Grooms, MD;  Location: [facility] CATH LAB;  Service: Cardiovascular;  Laterality: N/A;  . Percutaneous coronary stent intervention (pci-s) N/A 06/13/2014    Procedure: PERCUTANEOUS CORONARY STENT INTERVENTION (PCI-S);  Surgeon: Sinclair Grooms, MD;  Location: [facility] CATH LAB;  Service: Cardiovascular;  Laterality: N/A;  . Left heart cath N/A 06/14/2014    Procedure: LEFT HEART CATH;  Surgeon: Sinclair Grooms, MD;  Location: [facility] CATH LAB;  Service: Cardiovascular;  Laterality: N/A;  . Laparoscopic cholecystectomy  2008  . Skin cancer excision Left 10/2015    calf  . Skin cancer excision Right 2014?    chest  . Cardioversion  ~ 2010  . Colonoscopy N/A 11/14/2015    Procedure: COLONOSCOPY;  Surgeon: Manus Gunning, MD;  Location: [place];  Service:  Gastroenterology;  Laterality: N/A;  . Esophagogastroduodenoscopy N/A 11/14/2015    Procedure: ESOPHAGOGASTRODUODENOSCOPY (EGD);  Surgeon: Manus Gunning, MD;  Location: [place];  Service: Gastroenterology;  Laterality: N/A;  . Flexible sigmoidoscopy N/A 11/16/2015    Procedure: FLEXIBLE SIGMOIDOSCOPY;  Surgeon: Manus Gunning, MD;  Location: [place];  Service: Gastroenterology;  Laterality: N/A;      Prior to Admission medications   Medication Sig Start Date End Date Taking? Authorizing Provider  aspirin 81 MG tablet Take 81 mg by mouth daily.   Yes Historical Provider, MD  BD INSULIN SYRINGE ULTRAFINE 31G X 5/16" 0.3 ML MISC USE DAILY AS INSTRUCTED 12/02/15  Yes Tonia Ghent, MD  Cholecalciferol (VITAMIN D) 1000 UNITS capsule Take 1,000 Units by mouth daily.    Yes Historical Provider, MD  colchicine 0.6 MG tablet Take 1 tablet (0.6 mg total) by mouth daily as needed (gout flare up). 11/30/15  Yes Tonia Ghent, MD  fish oil-omega-3 fatty acids 1000 MG capsule Take 1 g by mouth daily.    Yes Historical Provider, MD  furosemide (LASIX) 80 MG tablet Take 1 tablet (80 mg total) by mouth daily. 11/30/15  Yes Tonia Ghent, MD  insulin glargine (LANTUS) 100 UNIT/ML injection Inject 0.15 mLs (15 Units total) into the skin at bedtime. 11/30/15  Yes Tonia Ghent, MD  isosorbide mononitrate (IMDUR) 30 MG 24 hr tablet TAKE 2 TABLETS TWICE A DAY 08/24/15  Yes Carlena Bjornstad, MD  magnesium oxide (MAG-OX) 400 MG tablet Take 400 mg by mouth daily.    Yes Historical Provider, MD  Multiple Vitamin (MULTIVITAMIN WITH MINERALS) TABS Take 1 tablet by mouth daily.   Yes Historical Provider, MD  nitroGLYCERIN (NITROSTAT) 0.4 MG SL tablet Place 1 tablet (0.4 mg total) under the tongue every 5 (five) minutes as needed for chest pain. 09/04/15  Yes [name], MD  Potassium Gluconate (K-99) 595 MG CAPS Take 595 mg by mouth daily.    Yes Historical Provider, MD  ranolazine (RANEXA)  500 MG 12 hr tablet Take 1 tablet (500 mg total) by mouth 2 (two) times daily. 09/28/15  Yes [name], MD  rosuvastatin (CRESTOR) 20 MG tablet Take 1 tablet (20 mg total) by mouth daily. 09/28/15  Yes Carlena Bjornstad, MD  TOPROL XL 50 MG 24 hr tablet TAKE 1 TABLET DAILY WITH A MEAL OR IMMEDIATELY FOLLOWING A MEAL 10/06/15  Yes Carlena Bjornstad, MD  traMADol (ULTRAM) 50 MG tablet Take 1 tablet (50 mg total) by mouth every 6 (six) hours as needed. ARTHRITIS Patient taking differently: Take 100 mg by mouth 2 (two) times daily. Scheduled (for arthritis pain) 05/24/15  Yes Tonia Ghent, MD     No Known Allergies     Social History:  reports that he has quit smoking. His smoking use included Cigarettes. He has a 8 pack-year smoking history. He has never used smokeless tobacco. He reports that he does not drink alcohol or use illicit drugs.    Family History  Problem Relation Age of Onset  . Kidney disease Mother     Kidney failure  . Stroke Mother   . Diabetes Mother   . Heart disease Father     MI  . Arthritis Sister   . Cancer Sister     Throat  . Heart disease Sister     MI  . Diabetes Sister   . Prostate cancer Neg Hx   . Colon cancer Neg Hx        Physical Exam:  GEN:  Pleasant Obese Elderly 82 y.o. Caucasian male examined and in no acute distress; cooperative with exam Filed Vitals:   01/03/16 0223 01/03/16 0230 01/03/16 0321 01/03/16 0330  BP: 121/56 116/53 120/57 129/61  Pulse: 69 67 71 66  Temp: 99 F (37.2 C)     TempSrc: Oral     Resp: 23 15 22 14   SpO2: 97% 99% 100% 100%   Blood pressure 129/61, pulse 66, temperature 99 F (37.2 C), temperature source Oral, resp. rate 14, SpO2 100 %. PSYCH: He is alert and oriented x4; does not appear anxious does not appear depressed; affect is normal HEENT: Normocephalic and Atraumatic, Mucous membranes pink; PERRLA; EOM intact; Fundi:  Benign;  No scleral icterus, Nares: Patent, Oropharynx: Clear,  Fair Dentition,     Neck:  FROM, No Cervical Lymphadenopathy nor Thyromegaly or Carotid Bruit; No JVD;  Breasts:: Not examined CHEST WALL: No tenderness CHEST: Normal respiration, clear to auscultation bilaterally HEART: Regular rate and rhythm; no murmurs rubs or gallops BACK: No kyphosis or scoliosis; No CVA tenderness ABDOMEN: Positive Bowel Sounds, Obese, Soft Non-Tender, No Rebound or Guarding; No Masses, No Organomegaly, Rectal Exam: Not done EXTREMITIES: No Cyanosis, Clubbing, or Edema; No Ulcerations. Genitalia: not examined PULSES: 2+ and symmetric SKIN: Normal hydration no rash or ulceration CNS:  Alert and Oriented x 4, No Focal Deficits Vascular: pulses palpable throughout    Labs on Admission:  Basic Metabolic Panel:  Recent Labs Lab 01/03/16 0235  NA 142  K 3.7  CL 100*  CO2 28  GLUCOSE 199*  BUN 35*  CREATININE 2.26*  CALCIUM 9.3   Liver Function Tests:  Recent Labs Lab 01/03/16 0235  AST 22  ALT 18  ALKPHOS 57  BILITOT 0.5  PROT 6.2*  ALBUMIN 3.0*   No results for input(s): LIPASE, AMYLASE in the last 168 hours. No results for input(s): AMMONIA in the last 168 hours. CBC:  Recent Labs Lab 01/03/16 0235  WBC 7.0  HGB 7.2*  HCT 23.2*  MCV 93.9  PLT 189   Cardiac Enzymes: No results for input(s): CKTOTAL, CKMB, CKMBINDEX, TROPONINI in the last 168 hours.  BNP (last 3 results) No results for input(s): BNP in the last 8760 hours.  ProBNP (last 3 results) No results for input(s): PROBNP in the last 8760 hours.  CBG: No results for input(s): GLUCAP in the last 168 hours.  Radiological Exams on Admission: Dg Chest 2 View  01/03/2016  CLINICAL DATA:  82 year old male with chest pain EXAM: CHEST  2 VIEW COMPARISON:  Radiograph dated 12/18/2015 FINDINGS: Two views of the chest demonstrate stable minimal left lung base linear atelectasis. There is no focal consolidation, pleural effusion, or pneumothorax. Stable cardiac silhouette. Median sternotomy wires and  CABG vascular clips. No acute osseous pathology. IMPRESSION: No active cardiopulmonary disease. Electronically Signed   By: Anner Crete M.D.   On: 01/03/2016 03:02     EKG: Independently reviewed.     Assessment/Plan:   82 y.o. male with  Principal Problem:    1.    GI bleed   SDU monitoring   Hold ASA Rx   IV Protonix   Monitor H/Hs   Transfuse PRN   GI consulted to this AM   Active Problems:     2.    Acute blood loss anemia    Monitor H/Hs    Transfuse PRN      3.    Diabetes mellitus ([place])    On Lantus RX, (on Hold)    SSI Coveragx PRn    Check HbA1C      4.    Diastolic CHF ([name])    Avoid Fluid Overload    Lasix PRN      5.    Atrial fibrillation ([place])- Hx had Ablation, not on Anticoagulants other than ASA      6.    CKD (chronic kidney disease) stage 3, GFR 30-59 ml/min    Monitor BUN/Cr      7.    DVT Prophylaxis    SCDs    Code Status:     FULL CODE        Family Communication:   Wife at Bedside Disposition Plan:    Inpatient Status        Time spent: [place] [facility] Pager [phone]  If 7AM -7PM Please Contact the Day Rounding Team MD for [facility]  If 7PM-7AM, Please Contact Night-Floor Coverage  [email or web address] Password [email or web address] 01/03/2016, 3:59 AM     ADDENDUM:   Patient was seen and examined on 01/03/2016

## 2016-01-04 DIAGNOSIS — I480 Paroxysmal atrial fibrillation: Secondary | ICD-10-CM

## 2016-01-04 LAB — TYPE AND SCREEN
ABO/RH(D): O NEG
ANTIBODY SCREEN: POSITIVE
DAT, IgG: NEGATIVE
Donor AG Type: NEGATIVE
Donor AG Type: NEGATIVE
PT AG Type: NEGATIVE
Unit division: 0
Unit division: 0

## 2016-01-04 LAB — BASIC METABOLIC PANEL
Anion gap: 9 (ref 5–15)
BUN: 23 mg/dL — AB (ref 6–20)
CHLORIDE: 109 mmol/L (ref 101–111)
CO2: 26 mmol/L (ref 22–32)
Calcium: 8.8 mg/dL — ABNORMAL LOW (ref 8.9–10.3)
Creatinine, Ser: 2 mg/dL — ABNORMAL HIGH (ref 0.61–1.24)
GFR calc Af Amer: 34 mL/min — ABNORMAL LOW (ref >60)
GFR calc non Af Amer: 30 mL/min — ABNORMAL LOW (ref >60)
Glucose, Bld: 114 mg/dL — ABNORMAL HIGH (ref 65–99)
POTASSIUM: 3.9 mmol/L (ref 3.5–5.1)
SODIUM: 144 mmol/L (ref 135–145)

## 2016-01-04 LAB — HEMOGLOBIN AND HEMATOCRIT, BLOOD
HCT: 27.8 % — ABNORMAL LOW (ref 39.0–52.0)
HCT: 29.3 % — ABNORMAL LOW (ref 39.0–52.0)
HEMOGLOBIN: 8.8 g/dL — AB (ref 13.0–17.0)
HEMOGLOBIN: 9.2 g/dL — AB (ref 13.0–17.0)

## 2016-01-04 LAB — GLUCOSE, CAPILLARY
GLUCOSE-CAPILLARY: 109 mg/dL — AB (ref 65–99)
GLUCOSE-CAPILLARY: 121 mg/dL — AB (ref 65–99)
GLUCOSE-CAPILLARY: 200 mg/dL — AB (ref 65–99)
GLUCOSE-CAPILLARY: 96 mg/dL (ref 65–99)
Glucose-Capillary: 102 mg/dL — ABNORMAL HIGH (ref 65–99)
Glucose-Capillary: 113 mg/dL — ABNORMAL HIGH (ref 65–99)

## 2016-01-04 LAB — TROPONIN I: Troponin I: 0.03 ng/mL (ref <0.031)

## 2016-01-04 MED ORDER — NITROGLYCERIN 0.4 MG SL SUBL
0.4000 mg | SUBLINGUAL_TABLET | SUBLINGUAL | Status: AC | PRN
  Administered 2016-01-14 (×3): 0.4 mg via SUBLINGUAL
  Filled 2016-01-04: qty 1

## 2016-01-04 MED ORDER — PEG-KCL-NACL-NASULF-NA ASC-C 100 G PO SOLR
1.0000 | Freq: Once | ORAL | Status: AC
  Administered 2016-01-04: 200 g via ORAL
  Filled 2016-01-04: qty 1

## 2016-01-04 MED ORDER — PANTOPRAZOLE SODIUM 40 MG PO TBEC
40.0000 mg | DELAYED_RELEASE_TABLET | Freq: Every day | ORAL | Status: DC
  Administered 2016-01-04 – 2016-01-13 (×10): 40 mg via ORAL
  Filled 2016-01-04 (×10): qty 1

## 2016-01-04 MED ORDER — KCL IN DEXTROSE-NACL 10-5-0.45 MEQ/L-%-% IV SOLN
INTRAVENOUS | Status: DC
Start: 2016-01-04 — End: 2016-01-04
  Administered 2016-01-04: 08:00:00 via INTRAVENOUS
  Filled 2016-01-04: qty 1000

## 2016-01-04 NOTE — Progress Notes (Signed)
Daily Rounding Note  01/04/2016, 9:27 AM  LOS: 1 day   SUBJECTIVE:       Staff says no stools since arrival.  Stable VS.     OBJECTIVE:         Vital signs in last 24 hours:    Temp:  [97.6 F (36.4 C)-98.5 F (36.9 C)] 98.5 F (36.9 C) (02/20 0823) Pulse Rate:  [55-100] 69 (02/20 0823) Resp:  [10-19] 18 (02/20 0823) BP: (99-151)/(50-81) 139/81 mmHg (02/20 0823) SpO2:  [96 %-100 %] 99 % (02/20 0823) Weight:  [84.2 kg (185 lb 10 oz)-84.4 kg (186 lb 1.1 oz)] 84.2 kg (185 lb 10 oz) (02/20 0500) Last BM Date: 01/03/16 Filed Weights   01/03/16 1711 01/04/16 0500  Weight: 84.4 kg (186 lb 1.1 oz) 84.2 kg (185 lb 10 oz)   General: sleeping soundly.  Did not awaken to gentle nudging so I did not attempt to vigorously awaken him.    Heart: RRR.  NSR on monitor Chest: breathing quietly, unlabored Abdomen: active BS, obese, soft.   Extremities: no CCE Neuro/Psych:  Sleeping.    Intake/Output from previous day: 02/19 0701 - 02/20 0700 In: 3040.9 [I.V.:2705.9; Blood:335] Out: -   Intake/Output this shift:    Lab Results:  Recent Labs  01/03/16 0235 01/03/16 0438 01/03/16 1712 01/03/16 1948 01/04/16 0320  WBC 7.0 7.2  --   --   --   HGB 7.2* 7.1* 9.4* 9.1* 8.8*  HCT 23.2* 22.6* 29.8* 29.0* 27.8*  PLT 189 182  --   --   --    BMET  Recent Labs  01/03/16 0235 01/03/16 0438  NA 142 140  K 3.7 3.8  CL 100* 102  CO2 28 30  GLUCOSE 199* 163*  BUN 35* 34*  CREATININE 2.26* 2.16*  CALCIUM 9.3 9.0   LFT  Recent Labs  01/03/16 0235  PROT 6.2*  ALBUMIN 3.0*  AST 22  ALT 18  ALKPHOS 57  BILITOT 0.5   PT/INR No results for input(s): LABPROT, INR in the last 72 hours. Hepatitis Panel No results for input(s): HEPBSAG, HCVAB, HEPAIGM, HEPBIGM in the last 72 hours.  Studies/Results: Dg Chest 2 View  01/03/2016  CLINICAL DATA:  82 year old male with chest pain EXAM: CHEST  2 VIEW COMPARISON:   Radiograph dated 12/18/2015 FINDINGS: Two views of the chest demonstrate stable minimal left lung base linear atelectasis. There is no focal consolidation, pleural effusion, or pneumothorax. Stable cardiac silhouette. Median sternotomy wires and CABG vascular clips. No acute osseous pathology. IMPRESSION: No active cardiopulmonary disease. Electronically Signed   By: Anner Crete M.D.   On: 01/03/2016 03:02   Nm Gi Blood Loss  01/03/2016  CLINICAL DATA:  History of GI diverticular bleed. GI bleeding. Dark blood in the stools noted 4 days ago. EXAM: NUCLEAR MEDICINE GASTROINTESTINAL BLEEDING SCAN TECHNIQUE: Sequential abdominal images were obtained following intravenous administration of Tc-34m labeled red blood cells. RADIOPHARMACEUTICALS:  25.0 mCi Tc-38m in-vitro labeled red cells. COMPARISON:  09/13/2008 FINDINGS: Early level activity is identified. Delayed images are performed, demonstrating no site of active bleeding. IMPRESSION: A site of active bleeding is not identified. Electronically Signed   By: Nolon Nations M.D.   On: 01/03/2016 16:27   Scheduled Meds: . sodium chloride   Intravenous Once  . insulin aspart  0-9 Units Subcutaneous 6 times per day  . isosorbide mononitrate  60 mg Oral BID WC  . ranolazine  500 mg  Oral BID   Continuous Infusions: . dextrose 5 % and 0.45 % NaCl with KCl 10 mEq/L     PRN Meds:.acetaminophen **OR** acetaminophen, HYDROmorphone (DILAUDID) injection, nitroGLYCERIN, ondansetron **OR** ondansetron (ZOFRAN) IV, oxyCODONE   ASSESMENT:   *  Recurrent diverticular bleed.  Multiple previous episodes. Recurrent despite cessation of Brilinta 5 to 6 weeks ago.  Nuc RBC scan negative 2/19.   *  Acute blood loss on top of chronic blood loss anemia.   Innumerable previous episodes requiring blood transfusions.  Current admission: s/p PRBC x 2.    *  Stage 3 CKD, limits option for angiography.   *  chronic Brilinta for a fib/flutter.  Was discontinued  during/after 12/30 - 1/7 admission.    *  CAD.  MI 11/2014 at time of diverticular bleed.  Current POC Troponin I is normal.   *  IDDM.    *  OSA.   PLAN   *  Stop IV Protonix.  Daily oral PPI (mild gastritis on EGD 11/2014 though no PPI on home med list. )      Azucena Freed  01/04/2016, 9:27 AM Pager: [phone]

## 2016-01-04 NOTE — Progress Notes (Signed)
[facility] PROGRESS NOTE  SHAFER GUERNSEY [id number] DOB: [birth year] DOA: 01/03/2016 PCP: [name], MD  Brief Summary  82 y.o.WM PMHx CAD native artery, Chronic Diastolic CHF, HTN, RBBB, Atrial Flutter/Fibrillation, NSTEMI, Stage III CKD, DM Type 2 with neuropathy, OSA on CPAP, and numerous episodes of previous Diverticular Bleeding who presented to the ED with passing dark stools and then BRBPR for 3-4 days. He denied ABD Pain, but reported chest pain at rest starting the night of admission which was the prompting symptom to come to the ER. He was evaluated in the ED and was found to have a Hb level of 7.2.  He was hospitalized about one month ago for similar bleeding at which time his Kary Kos was stopped, however, he was kept on a baby aspirin.    Assessment/Plan  Acute GI bleed/acute blood loss anemia -Continue Protonix 40 mg BID -See CHF -Continue H/H TID  Chest pain in pt with known CAD s/p NSTEMI 11/2015, at rest suggestive of unstable angina or stable angina worsened by acute anemia.   -  ECG at admission with T-wave inversions in the inferolateral leads -  POC trop neg -  Repeat ECG and troponin.  If this troponin negative, no need to repeat further -  Chest pain resolved with blood transfusion -  F/u with cardiology -  Tele:  NSR -  Resume aspirin ASAP  DM type II -Hemoglobin A1c 6.9 -Continue Sensitive SSI -2/4 lipid panel within ADA guidelines  Combined chronic Systolic and Diastolic CHF ([name]) - Wt stable - D/C normal saline 40 mL/hr - Tranfuse for hemoglobin <8 - 2/19 Transfuse 1 unit PRBC  Atrial fibrillation ([name]) - Hx had Ablation -  No anticoagulants  Pulmonary hypertension -See CHF  CKD (chronic kidney disease) stage 3,  - minimize nephrotoxic medicationand renally dose medications  Diet:  CLD Access:  PIV  IVF:  off Proph:  SCDs  Code Status: full Family Communication:  patient alone Disposition Plan:  Awaiting stable hemoglobin for at least 24-48 hours.  Would like to resume aspirin as soon as possible due to recent NSTEMI and episode of chest pain prior to this admission   Consultants:  Gastroenterology, [place]  Procedures:  NM GI Blood Loss scan  CXR  Antibiotics:  none   HPI/Subjective:  Patient states he normally has stable angina with chest pain and pressure and SOB when he goes to the mailbox, however, he had similar chest pressure with SOB at rest the night before admission, worst was 4/5 and improved to 1/5 with nitroglycerin.    Objective: Filed Vitals:   01/04/16 0015 01/04/16 0500 01/04/16 0823 01/04/16 1202  BP: 151/68 125/55 139/81 134/66  Pulse: 64 68 69 72  Temp: 97.6 F (36.4 C) 98.2 F (36.8 C) 98.5 F (36.9 C) 98 F (36.7 C)  TempSrc: Oral Oral Oral Oral  Resp: 17 19 18 20   Height:      Weight:  84.2 kg (185 lb 10 oz)    SpO2: 100% 99% 99% 99%    Intake/Output Summary (Last 24 hours) at 01/04/16 1344 Last data filed at 01/04/16 1033  Gross per 24 hour  Intake 2705.92 ml  Output      0 ml  Net 2705.92 ml   Filed Weights   01/03/16 1711 01/04/16 0500  Weight: 84.4 kg (186 lb 1.1 oz) 84.2 kg (185 lb 10 oz)   Body mass index is 27.4 kg/(m^2).  Exam:   General:  Adult male, No acute  distress  HEENT:  NCAT, MMM  Cardiovascular:  RRR, nl S1, S2 no mrg, 2+ pulses, warm extremities  Respiratory:  CTAB, no increased WOB  Abdomen:   NABS, soft, NT/ND  MSK:   Normal tone and bulk, no LEE  Neuro:  Grossly intact  Data Reviewed: Basic Metabolic Panel:  Recent Labs Lab 01/03/16 0235 01/03/16 0438 01/04/16 0759  NA 142 140 144  K 3.7 3.8 3.9  CL 100* 102 109  CO2 28 30 26   GLUCOSE 199* 163* 114*  BUN 35* 34* 23*  CREATININE 2.26* 2.16* 2.00*  CALCIUM 9.3 9.0 8.8*   Liver Function Tests:  Recent Labs Lab 01/03/16 0235  AST 22  ALT 18  ALKPHOS 57  BILITOT 0.5  PROT 6.2*  ALBUMIN 3.0*    No results for input(s): LIPASE, AMYLASE in the last 168 hours. No results for input(s): AMMONIA in the last 168 hours. CBC:  Recent Labs Lab 01/03/16 0235 01/03/16 0438 01/03/16 1712 01/03/16 1948 01/04/16 0320  WBC 7.0 7.2  --   --   --   HGB 7.2* 7.1* 9.4* 9.1* 8.8*  HCT 23.2* 22.6* 29.8* 29.0* 27.8*  MCV 93.9 94.2  --   --   --   PLT 189 182  --   --   --     Recent Results (from the past 240 hour(s))  MRSA PCR Screening     Status: None   Collection Time: 01/03/16  5:57 PM  Result Value Ref Range Status   MRSA by PCR NEGATIVE NEGATIVE Final    Comment:        The GeneXpert MRSA Assay (FDA approved for NASAL specimens only), is one component of a comprehensive MRSA colonization surveillance program. It is not intended to diagnose MRSA infection nor to guide or monitor treatment for MRSA infections.      Studies: Dg Chest 2 View  01/03/2016  CLINICAL DATA:  82 year old male with chest pain EXAM: CHEST  2 VIEW COMPARISON:  Radiograph dated 12/18/2015 FINDINGS: Two views of the chest demonstrate stable minimal left lung base linear atelectasis. There is no focal consolidation, pleural effusion, or pneumothorax. Stable cardiac silhouette. Median sternotomy wires and CABG vascular clips. No acute osseous pathology. IMPRESSION: No active cardiopulmonary disease. Electronically Signed   By: Anner Crete M.D.   On: 01/03/2016 03:02   Nm Gi Blood Loss  01/03/2016  CLINICAL DATA:  History of GI diverticular bleed. GI bleeding. Dark blood in the stools noted 4 days ago. EXAM: NUCLEAR MEDICINE GASTROINTESTINAL BLEEDING SCAN TECHNIQUE: Sequential abdominal images were obtained following intravenous administration of Tc-29m labeled red blood cells. RADIOPHARMACEUTICALS:  25.0 mCi Tc-37m in-vitro labeled red cells. COMPARISON:  09/13/2008 FINDINGS: Early level activity is identified. Delayed images are performed, demonstrating no site of active bleeding. IMPRESSION: A site of  active bleeding is not identified. Electronically Signed   By: Nolon Nations M.D.   On: 01/03/2016 16:27    Scheduled Meds: . sodium chloride   Intravenous Once  . insulin aspart  0-9 Units Subcutaneous 6 times per day  . isosorbide mononitrate  60 mg Oral BID WC  . pantoprazole  40 mg Oral Q0600  . ranolazine  500 mg Oral BID   Continuous Infusions: . dextrose 5 % and 0.45 % NaCl with KCl 10 mEq/L 40 mL/hr at 01/04/16 [id number]    Principal Problem:   GI bleed Active Problems:   CKD (chronic kidney disease) stage 3, GFR 30-59 ml/min   Acute blood  loss anemia   Acute on chronic diastolic (congestive) heart failure ([name])   Diabetes mellitus ([name])   Diastolic CHF ([name])   Atrial fibrillation ([name])   Chronic combined systolic and diastolic CHF (congestive heart failure) ([place])   Pulmonary hypertension ([place])   CKD (chronic kidney disease), stage III    Time spent: 30 min    Mansur Patti, [place] [facility] Pager [phone]. If 7PM-7AM, please contact night-coverage at [email or web address], password [email or web address] 01/04/2016, 1:44 PM  LOS: 1 day

## 2016-01-05 ENCOUNTER — Encounter (HOSPITAL_COMMUNITY)
Admission: EM | Disposition: A | Discharge status: Skilled Nursing Facility | Payer: Self-pay | Source: Home / Self Care | Attending: Internal Medicine

## 2016-01-05 ENCOUNTER — Encounter (HOSPITAL_COMMUNITY): Payer: Self-pay

## 2016-01-05 DIAGNOSIS — D62 Acute posthemorrhagic anemia: Secondary | ICD-10-CM

## 2016-01-05 DIAGNOSIS — K5731 Diverticulosis of large intestine without perforation or abscess with bleeding: Principal | ICD-10-CM

## 2016-01-05 DIAGNOSIS — N183 Chronic kidney disease, stage 3 (moderate): Secondary | ICD-10-CM

## 2016-01-05 HISTORY — PX: COLONOSCOPY: SHX5424

## 2016-01-05 LAB — BASIC METABOLIC PANEL
ANION GAP: 8 (ref 5–15)
BUN: 19 mg/dL (ref 6–20)
CHLORIDE: 108 mmol/L (ref 101–111)
CO2: 24 mmol/L (ref 22–32)
Calcium: 8.7 mg/dL — ABNORMAL LOW (ref 8.9–10.3)
Creatinine, Ser: 1.89 mg/dL — ABNORMAL HIGH (ref 0.61–1.24)
GFR calc non Af Amer: 32 mL/min — ABNORMAL LOW (ref >60)
GFR, EST AFRICAN AMERICAN: 37 mL/min — AB (ref >60)
Glucose, Bld: 122 mg/dL — ABNORMAL HIGH (ref 65–99)
POTASSIUM: 3.7 mmol/L (ref 3.5–5.1)
SODIUM: 140 mmol/L (ref 135–145)

## 2016-01-05 LAB — HEMOGLOBIN AND HEMATOCRIT, BLOOD
HCT: 27.4 % — ABNORMAL LOW (ref 39.0–52.0)
HCT: 26.8 % — ABNORMAL LOW (ref 39.0–52.0)
Hemoglobin: 8.7 g/dL — ABNORMAL LOW (ref 13.0–17.0)
Hemoglobin: 8.8 g/dL — ABNORMAL LOW (ref 13.0–17.0)

## 2016-01-05 LAB — GLUCOSE, CAPILLARY
GLUCOSE-CAPILLARY: 123 mg/dL — AB (ref 65–99)
GLUCOSE-CAPILLARY: 133 mg/dL — AB (ref 65–99)
GLUCOSE-CAPILLARY: 133 mg/dL — AB (ref 65–99)
GLUCOSE-CAPILLARY: 140 mg/dL — AB (ref 65–99)
GLUCOSE-CAPILLARY: 152 mg/dL — AB (ref 65–99)
GLUCOSE-CAPILLARY: 215 mg/dL — AB (ref 65–99)

## 2016-01-05 LAB — HEMOGLOBIN A1C
Hgb A1c MFr Bld: 6 % — ABNORMAL HIGH (ref 4.8–5.6)
Mean Plasma Glucose: 126 mg/dL

## 2016-01-05 SURGERY — COLONOSCOPY
Anesthesia: Moderate Sedation | Laterality: Left

## 2016-01-05 SURGERY — COLONOSCOPY
Anesthesia: Moderate Sedation

## 2016-01-05 MED ORDER — ASPIRIN EC 81 MG PO TBEC
81.0000 mg | DELAYED_RELEASE_TABLET | Freq: Every day | ORAL | Status: DC
  Administered 2016-01-05 – 2016-01-13 (×8): 81 mg via ORAL
  Filled 2016-01-05 (×8): qty 1

## 2016-01-05 MED ORDER — METOPROLOL TARTRATE 12.5 MG HALF TABLET
12.5000 mg | ORAL_TABLET | Freq: Two times a day (BID) | ORAL | Status: DC
  Administered 2016-01-05 – 2016-01-13 (×16): 12.5 mg via ORAL
  Filled 2016-01-05 (×16): qty 1

## 2016-01-05 MED ORDER — FENTANYL CITRATE (PF) 100 MCG/2ML IJ SOLN
INTRAMUSCULAR | Status: AC
  Filled 2016-01-05: qty 2

## 2016-01-05 MED ORDER — ROSUVASTATIN CALCIUM 40 MG PO TABS
40.0000 mg | ORAL_TABLET | Freq: Every day | ORAL | Status: DC
Start: 2016-01-05 — End: 2016-01-13
  Administered 2016-01-05 – 2016-01-12 (×8): 40 mg via ORAL
  Filled 2016-01-05 (×3): qty 2
  Filled 2016-01-05: qty 4
  Filled 2016-01-05: qty 2
  Filled 2016-01-05 (×2): qty 4
  Filled 2016-01-05: qty 2
  Filled 2016-01-05 (×3): qty 4

## 2016-01-05 MED ORDER — SPOT INK MARKER SYRINGE KIT
PACK | SUBMUCOSAL | Status: AC
  Filled 2016-01-05: qty 5

## 2016-01-05 MED ORDER — EPINEPHRINE HCL 0.1 MG/ML IJ SOSY
PREFILLED_SYRINGE | INTRAMUSCULAR | Status: AC
  Filled 2016-01-05: qty 10

## 2016-01-05 MED ORDER — MIDAZOLAM HCL 5 MG/ML IJ SOLN
INTRAMUSCULAR | Status: AC
  Filled 2016-01-05: qty 2

## 2016-01-05 MED ORDER — DIPHENHYDRAMINE HCL 50 MG/ML IJ SOLN
INTRAMUSCULAR | Status: AC
  Filled 2016-01-05: qty 1

## 2016-01-05 MED ORDER — FUROSEMIDE 80 MG PO TABS
80.0000 mg | ORAL_TABLET | Freq: Every day | ORAL | Status: DC
  Administered 2016-01-06 – 2016-01-13 (×8): 80 mg via ORAL
  Filled 2016-01-05 (×8): qty 1

## 2016-01-05 NOTE — Op Note (Signed)
[facility] [place], [address]   COLONOSCOPY PROCEDURE REPORT  PATIENT: [name], [name]  MR#: [id number] BIRTHDATE: 04/29/1932 , 8  yrs. old GENDER: male ENDOSCOPIST: Yetta Flock, MD REFERRED BY: PROCEDURE DATE:  01/05/2016 PROCEDURE:   Colonoscopy, diagnostic  ASA CLASS:   Class III INDICATIONS:diverticular bleed, poor surgical candidate and poor candidate for IR. MEDICATIONS: No medications  DESCRIPTION OF PROCEDURE:   After the risks benefits and alternatives of the procedure were thoroughly explained, informed consent was obtained.  The digital rectal exam revealed no abnormalities of the rectum.   The Pentax Ped Colon [id number] endoscope was introduced through the anus and advanced to the cecum, which was identified by both the appendix and ileocecal valve. No adverse events experienced.   The quality of the prep was fair.  The instrument was then slowly withdrawn as the colon was fully examined. Estimated blood loss is zero unless otherwise noted in this procedure report.    COLON FINDINGS: There was pancolonic diverticulosis, severe in the left colon, moderate in the transverse, and mild in the right colon.  The prep was fair however with lavage adequate views were obtained.  The patient tolerated the procedure well without any sedation (he had a NSTEMI last month when sedated with propofol). Brown liquid stool was noted throughout the colon without any old blood or evidence of active bleeding.  Time was taken to lavage the stool from the colon but no bleeding was noted.  No stigmata of bleeding was noted at any of the diverticuli.  Limited views of the ileum were obtained which appeared normal without blood.  The remainder of the colon was normal but the prep was not adequate to rule out small or very flat polyps.  Retroflexed views revealed no abnormalities. The time to cecum = 9.4 Withdrawal time = 13.0   The scope  was withdrawn and the procedure completed. COMPLICATIONS: There were no immediate complications.     ENDOSCOPIC IMPRESSION: Pancolonic diverticulosis, worse in the left colon, without evidence of active bleeding or old blood in the colon Suspect diverticular bleed, more than likely left sided, which has intervally resolved.  RECOMMENDATIONS: Return to the medical ward Repeat H/H, transfuse PRBC if needed per primary service Liquid diet If evidence of rebleeding would recommend tagged RBC scan with IR consultation and general surgery consult Please call with questions or changes in his status  eSigned:  Yetta Flock, MD 01/05/2016 1:06 PM   cc:  the patient   PATIENT NAME:  Fuad, Higgins MR#: [id number]

## 2016-01-05 NOTE — Progress Notes (Addendum)
[facility] PROGRESS NOTE  [name] [id number] DOB: [birth year] DOA: 01/03/2016 PCP: [name], MD  Brief Summary  82 y.o.WM PMHx CAD native artery, Chronic Diastolic CHF, HTN, RBBB, Atrial Flutter/Fibrillation, NSTEMI, Stage III CKD, DM Type 2 with neuropathy, OSA on CPAP, and numerous episodes of previous Diverticular Bleeding who presented to the ED with passing dark stools and then BRBPR for 3-4 days. He denied ABD Pain, but reported chest pain at rest starting the night of admission which was the prompting symptom to come to the ER. He was evaluated in the ED and was found to have a Hb level of 7.2.  He was hospitalized about one month ago for similar bleeding at which time his Kary Kos was stopped, however, he was kept on a baby aspirin.    Assessment/Plan  Acute GI bleed/acute blood loss anemia, likely diverticular bleed, hemoglobin stable, only one bloody bowel movement since yesterday -  Nuclear medicine tagged red cell scan negative for source of bleeding -  Appreciate gastroenterology assistance, extensive diverticulosis on exam but no obvious bleeding on colonoscopy on 2/21 - 2/19 Transfuse 1 unit PRBC -  Agree with repeat tagged cell scan with embolization or surgery if he has severe bleeding  Chest pain in pt with known CAD s/p NSTEMI 11/2015, at rest suggestive of unstable angina or stable angina worsened by acute anemia.   -  ECG at admission with T-wave inversions in the inferolateral leads, repeat EKG with right bundle branch block and persistent T-wave inversions in the lateral leads -  POC trop neg, follow-up troponin negative -  Chest pain resolved with blood transfusion -  F/u with cardiology -  Tele:  NSR with first degree block -  Resume aspirin ASAP -  Resume metoprolol and high dose statin  DM type II, CBG stable -Hemoglobin A1c 6.9 -Continue Sensitive SSI -12/19/15 lipid panel within ADA guidelines  Combined chronic Systolic and Diastolic  CHF ([place]) - Wt stable - Tranfuse for hemoglobin <8 -  Resume lasix in AM  Atrial fibrillation ([name]) - Hx had Ablation -  No anticoagulants  Essential hypertension, blood pressure elevated -  Resume metoprolol   Pulmonary hypertension - diuretics as above  CKD (chronic kidney disease) stage 3, creatinine 1.89, approximately stable - minimize nephrotoxic medicationand renally dose medications  Diet:  CLD Access:  PIV  IVF:  off Proph:  SCDs  Code Status: full Family Communication: patient alone Disposition Plan:  Awaiting stable hemoglobin for at least 24-48 hours.  Would like to resume aspirin as soon as possible due to recent NSTEMI and episode of chest pain prior to this admission.   Consultants:  Gastroenterology, [place]  Procedures:  NM GI Blood Loss scan  CXR  Colonoscopy 01/05/2016  Antibiotics:  none   HPI/Subjective:  Had one BM yesterday which was witnessed by GI that had some blood in it.  This morning, he had a BM that was mostly brown and dark but no obvious blood.  Denies lightheadedness, shortness of breath, chest pains.  Feels like his ankles are swelling.  Objective: Filed Vitals:   01/05/16 1240 01/05/16 1245 01/05/16 1250 01/05/16 1257  BP: 161/71 164/66 168/74 188/79  Pulse: 92 89 93   Temp:    97.9 F (36.6 C)  TempSrc:    Oral  Resp: 19 23 16  122  Height:      Weight:      SpO2: 100% 100% 100% 100%   No intake or output data in the  24 hours ending 01/05/16 1601 Filed Weights   01/03/16 1711 01/04/16 0500 01/05/16 0159  Weight: 84.4 kg (186 lb 1.1 oz) 84.2 kg (185 lb 10 oz) 84.641 kg (186 lb 9.6 oz)   Body mass index is 27.54 kg/(m^2).  Exam:   General:  Adult male, No acute distress  HEENT:  NCAT, MMM  Cardiovascular:  RRR, nl S1, S2 no mrg, 2+ pulses, warm extremities  Respiratory:  CTAB, no increased WOB  Abdomen:   NABS, soft, NT/ND  MSK:   Normal tone and bulk,  1+ pitting bilateral LEE  Neuro:  Grossly moves all extremities  Data Reviewed: Basic Metabolic Panel:  Recent Labs Lab 01/03/16 0235 01/03/16 0438 01/04/16 0759 01/05/16 0415  NA 142 140 144 140  K 3.7 3.8 3.9 3.7  CL 100* 102 109 108  CO2 28 30 26 24   GLUCOSE 199* 163* 114* 122*  BUN 35* 34* 23* 19  CREATININE 2.26* 2.16* 2.00* 1.89*  CALCIUM 9.3 9.0 8.8* 8.7*   Liver Function Tests:  Recent Labs Lab 01/03/16 0235  AST 22  ALT 18  ALKPHOS 57  BILITOT 0.5  PROT 6.2*  ALBUMIN 3.0*   No results for input(s): LIPASE, AMYLASE in the last 168 hours. No results for input(s): AMMONIA in the last 168 hours. CBC:  Recent Labs Lab 01/03/16 0235 01/03/16 0438  01/03/16 1948 01/04/16 0320 01/04/16 1615 01/05/16 0415 01/05/16 1531  WBC 7.0 7.2  --   --   --   --   --   --   HGB 7.2* 7.1*  < > 9.1* 8.8* 9.2* 8.7* 8.8*  HCT 23.2* 22.6*  < > 29.0* 27.8* 29.3* 27.4* 26.8*  MCV 93.9 94.2  --   --   --   --   --   --   PLT 189 182  --   --   --   --   --   --   < > = values in this interval not displayed.  Recent Results (from the past 240 hour(s))  MRSA PCR Screening     Status: None   Collection Time: 01/03/16  5:57 PM  Result Value Ref Range Status   MRSA by PCR NEGATIVE NEGATIVE Final    Comment:        The GeneXpert MRSA Assay (FDA approved for NASAL specimens only), is one component of a comprehensive MRSA colonization surveillance program. It is not intended to diagnose MRSA infection nor to guide or monitor treatment for MRSA infections.      Studies: Nm Gi Blood Loss  01/03/2016  CLINICAL DATA:  History of GI diverticular bleed. GI bleeding. Dark blood in the stools noted 4 days ago. EXAM: NUCLEAR MEDICINE GASTROINTESTINAL BLEEDING SCAN TECHNIQUE: Sequential abdominal images were obtained following intravenous administration of Tc-24m labeled red blood cells. RADIOPHARMACEUTICALS:  25.0 mCi Tc-16m in-vitro labeled red cells. COMPARISON:  09/13/2008  FINDINGS: Early level activity is identified. Delayed images are performed, demonstrating no site of active bleeding. IMPRESSION: A site of active bleeding is not identified. Electronically Signed   By: Nolon Nations M.D.   On: 01/03/2016 16:27    Scheduled Meds: . insulin aspart  0-9 Units Subcutaneous 6 times per day  . isosorbide mononitrate  60 mg Oral BID WC  . pantoprazole  40 mg Oral Q0600  . ranolazine  500 mg Oral BID   Continuous Infusions:    Principal Problem:   GI bleed Active Problems:   CKD (chronic kidney disease)  stage 3, GFR 30-59 ml/min   Acute blood loss anemia   Acute on chronic diastolic (congestive) heart failure ([name])   Diabetes mellitus ([name])   Diastolic CHF ([name])   Atrial fibrillation ([name])   Chronic combined systolic and diastolic CHF (congestive heart failure) ([place])   Pulmonary hypertension ([place])   CKD (chronic kidney disease), stage III    Time spent: 30 min    Aryan Bello, [facility] Pager [phone]. If 7PM-7AM, please contact night-coverage at [email or web address], password [facility] 01/05/2016, 4:01 PM  LOS: 2 days

## 2016-01-05 NOTE — H&P (View-Only) (Signed)
Progress Note   Subjective  [name] completed only half of his bowel prep overnight. 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 He had a BM this AM and had a lot of brown stool with slight red tinge. No chest pains or shortness of breath. Troponins have been negative.   Objective   Vital signs in last 24 hours: Temp:  [98 F (36.7 C)-98.7 F (37.1 C)] 98.2 F (36.8 C) (02/21 0758) Pulse Rate:  [70-84] 84 (02/21 0758) Resp:  [11-20] 11 (02/21 0758) BP: (134-167)/(66-74) 162/74 mmHg (02/21 0758) SpO2:  [98 %-100 %] 100 % (02/21 0758) Weight:  [186 lb 9.6 oz (84.641 kg)] 186 lb 9.6 oz (84.641 kg) (02/21 0159) Last BM Date: 01/04/16 General:    white male in NAD Heart:  Regular rate and rhythm; no murmurs Lungs: Respirations even and unlabored, lungs CTA bilaterally Abdomen:  Soft, nontender and nondistended. Normal bowel sounds. Extremities:  Without edema. Neurologic:  Alert and oriented,  grossly normal neurologically. Psych:  Cooperative. Normal mood and affect.  Intake/Output from previous day: 02/20 0701 - 02/21 0700 In: 240 [P.O.:240] Out: 300 [Urine:300] Intake/Output this shift:    Lab Results:  Recent Labs  01/03/16 0235 01/03/16 0438  01/04/16 0320 01/04/16 1615 01/05/16 0415  WBC 7.0 7.2  --   --   --   --   HGB 7.2* 7.1*  < > 8.8* 9.2* 8.7*  HCT 23.2* 22.6*  < > 27.8* 29.3* 27.4*  PLT 189 182  --   --   --   --   < > = values in this interval not displayed. BMET  Recent Labs  01/03/16 0438 01/04/16 0759 01/05/16 0415  NA 140 144 140  K 3.8 3.9 3.7  CL 102 109 108  CO2 30 26 24   GLUCOSE 163* 114* 122*  BUN 34* 23* 19  CREATININE 2.16* 2.00* 1.89*  CALCIUM 9.0 8.8* 8.7*   LFT  Recent Labs  01/03/16 0235  PROT 6.2*  ALBUMIN 3.0*  AST 22  ALT 18  ALKPHOS 57  BILITOT 0.5   PT/INR No results for input(s): LABPROT, INR in the last 72 hours.  Studies/Results: Nm Gi Blood  Loss  01/03/2016  CLINICAL DATA:  History of GI diverticular bleed. GI bleeding. Dark blood in the stools noted 4 days ago. EXAM: NUCLEAR MEDICINE GASTROINTESTINAL BLEEDING SCAN TECHNIQUE: Sequential abdominal images were obtained following intravenous administration of Tc-48m labeled red blood cells. RADIOPHARMACEUTICALS:  25.0 mCi Tc-81m in-vitro labeled red cells. COMPARISON:  09/13/2008 FINDINGS: Early level activity is identified. Delayed images are performed, demonstrating no site of active bleeding. IMPRESSION: A site of active bleeding is not identified. Electronically Signed   By: Nolon Nations M.D.   On: 01/03/2016 16:27       Assessment / Plan:   82 y/o male with history of CAD, with recurrent diverticular bleeding complicated by NSTEMI during his last admission in January. He is high risk for contrast nephropathy if IR embolization is done given his CKD, while he is a high operative risk for surgery. In this light his bleeding diverticulum was identified endoscopically in the left colon during last admission and treated with multiple hemostasis clips which stopped the bleeding, however unfortunately he had recurrence this past week.   He passed red blood per rectum yesterday afternoon and he thinks passed some more blood overnight with his prep. Hgb has downtrended slightly. He has  only completed half of his prep and is working on the rest now to clear his stool and see if he continues to pass blood.  I discussed options with him to include IR embolization (although last tagged RBC scan negative), surgery, and repeat colonoscopy. He wishes to avoid IR and surgery if at all possible given the associated risks. I explained to him that Colonoscopy is not a good long term option to prevent recurrent bleeding, which is likely given his history, but could consider it for hemostasis initially given the risks of possible dialysis which he may need with IR, and again he strongly wishes to avoid any  type of surgery. He understands he is in a difficult situation and I explained to him that colonoscopy can be very difficult to see the culprit lesion and treat successfully. I suspect he is slowly bleeding given negative tagged RBC scan. For now will complete the rest of his prep today and see what he puts out. If he continues to bleed I offered him a colonoscopy. I discussed risks of sedation with him and he tolerated an un-sedated colonoscopy previously, and wished to proceed without sedation given the risks and his NSTEMI during the last admission. If colonoscopy is not successful and he continues to bleed he would then be amenable to IR and surgical consultation. I will reassess him after his bowel prep today. He agreed with the plan.   Oakwood Cellar, MD [facility] Pager [phone]    Principal Problem:   GI bleed Active Problems:   CKD (chronic kidney disease) stage 3, GFR 30-59 ml/min   Acute blood loss anemia   Acute on chronic diastolic (congestive) heart failure ([name])   Diabetes mellitus ([name])   Diastolic CHF ([name])   Atrial fibrillation ([name])   Chronic combined systolic and diastolic CHF (congestive heart failure) ([place])   Pulmonary hypertension ([place])   CKD (chronic kidney disease), stage III     LOS: 2 days   [name]  01/05/2016, 8:32 AM

## 2016-01-05 NOTE — Progress Notes (Signed)
Progress Note   Subjective  [name] completed only half of his bowel prep overnight. 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 He had a BM this AM and had a lot of brown stool with slight red tinge. No chest pains or shortness of breath. Troponins have been negative.   Objective   Vital signs in last 24 hours: Temp:  [98 F (36.7 C)-98.7 F (37.1 C)] 98.2 F (36.8 C) (02/21 0758) Pulse Rate:  [70-84] 84 (02/21 0758) Resp:  [11-20] 11 (02/21 0758) BP: (134-167)/(66-74) 162/74 mmHg (02/21 0758) SpO2:  [98 %-100 %] 100 % (02/21 0758) Weight:  [186 lb 9.6 oz (84.641 kg)] 186 lb 9.6 oz (84.641 kg) (02/21 0159) Last BM Date: 01/04/16 General:    white male in NAD Heart:  Regular rate and rhythm; no murmurs Lungs: Respirations even and unlabored, lungs CTA bilaterally Abdomen:  Soft, nontender and nondistended. Normal bowel sounds. Extremities:  Without edema. Neurologic:  Alert and oriented,  grossly normal neurologically. Psych:  Cooperative. Normal mood and affect.  Intake/Output from previous day: 02/20 0701 - 02/21 0700 In: 240 [P.O.:240] Out: 300 [Urine:300] Intake/Output this shift:    Lab Results:  Recent Labs  01/03/16 0235 01/03/16 0438  01/04/16 0320 01/04/16 1615 01/05/16 0415  WBC 7.0 7.2  --   --   --   --   HGB 7.2* 7.1*  < > 8.8* 9.2* 8.7*  HCT 23.2* 22.6*  < > 27.8* 29.3* 27.4*  PLT 189 182  --   --   --   --   < > = values in this interval not displayed. BMET  Recent Labs  01/03/16 0438 01/04/16 0759 01/05/16 0415  NA 140 144 140  K 3.8 3.9 3.7  CL 102 109 108  CO2 30 26 24   GLUCOSE 163* 114* 122*  BUN 34* 23* 19  CREATININE 2.16* 2.00* 1.89*  CALCIUM 9.0 8.8* 8.7*   LFT  Recent Labs  01/03/16 0235  PROT 6.2*  ALBUMIN 3.0*  AST 22  ALT 18  ALKPHOS 57  BILITOT 0.5   PT/INR No results for input(s): LABPROT, INR in the last 72 hours.  Studies/Results: Nm Gi Blood  Loss  01/03/2016  CLINICAL DATA:  History of GI diverticular bleed. GI bleeding. Dark blood in the stools noted 4 days ago. EXAM: NUCLEAR MEDICINE GASTROINTESTINAL BLEEDING SCAN TECHNIQUE: Sequential abdominal images were obtained following intravenous administration of Tc-37m labeled red blood cells. RADIOPHARMACEUTICALS:  25.0 mCi Tc-67m in-vitro labeled red cells. COMPARISON:  09/13/2008 FINDINGS: Early level activity is identified. Delayed images are performed, demonstrating no site of active bleeding. IMPRESSION: A site of active bleeding is not identified. Electronically Signed   By: Nolon Nations M.D.   On: 01/03/2016 16:27       Assessment / Plan:   82 y/o male with history of CAD, with recurrent diverticular bleeding complicated by NSTEMI during his last admission in January. He is high risk for contrast nephropathy if IR embolization is done given his CKD, while he is a high operative risk for surgery. In this light his bleeding diverticulum was identified endoscopically in the left colon during last admission and treated with multiple hemostasis clips which stopped the bleeding, however unfortunately he had recurrence this past week.   He passed red blood per rectum yesterday afternoon and he thinks passed some more blood overnight with his prep. Hgb has downtrended slightly. He has  only completed half of his prep and is working on the rest now to clear his stool and see if he continues to pass blood.  I discussed options with him to include IR embolization (although last tagged RBC scan negative), surgery, and repeat colonoscopy. He wishes to avoid IR and surgery if at all possible given the associated risks. I explained to him that Colonoscopy is not a good long term option to prevent recurrent bleeding, which is likely given his history, but could consider it for hemostasis initially given the risks of possible dialysis which he may need with IR, and again he strongly wishes to avoid any  type of surgery. He understands he is in a difficult situation and I explained to him that colonoscopy can be very difficult to see the culprit lesion and treat successfully. I suspect he is slowly bleeding given negative tagged RBC scan. For now will complete the rest of his prep today and see what he puts out. If he continues to bleed I offered him a colonoscopy. I discussed risks of sedation with him and he tolerated an un-sedated colonoscopy previously, and wished to proceed without sedation given the risks and his NSTEMI during the last admission. If colonoscopy is not successful and he continues to bleed he would then be amenable to IR and surgical consultation. I will reassess him after his bowel prep today. He agreed with the plan.   Durango Cellar, MD [facility] Pager [phone]    Principal Problem:   GI bleed Active Problems:   CKD (chronic kidney disease) stage 3, GFR 30-59 ml/min   Acute blood loss anemia   Acute on chronic diastolic (congestive) heart failure ([name])   Diabetes mellitus ([name])   Diastolic CHF ([name])   Atrial fibrillation ([name])   Chronic combined systolic and diastolic CHF (congestive heart failure) ([place])   Pulmonary hypertension ([place])   CKD (chronic kidney disease), stage III     LOS: 2 days   Renelda Loma Dnasia Gauna  01/05/2016, 8:32 AM

## 2016-01-05 NOTE — Interval H&P Note (Signed)
History and Physical Interval Note:  01/05/2016 12:22 PM  JULION RIGOLI  has presented today for surgery, with the diagnosis of diverticular bleed  The various methods of treatment have been discussed with the patient and family. After consideration of risks, benefits and other options for treatment, the patient has consented to  Procedure(s) with comments: COLONOSCOPY (Left) - no sedation to start, moderate if needed as a surgical intervention .  The patient's history has been reviewed, patient examined, no change in status, stable for surgery.  I have reviewed the patient's chart and labs.  Questions were answered to the patient's satisfaction.     Renelda Loma Napolean Sia

## 2016-01-05 NOTE — Progress Notes (Signed)
Patient is refusing CPAP for tonight, states he wears one at home but he has not tolerated our mask. Patient state he will be hear 1 more day and will be fine. RT stated that if he changed his mind to call and that if he would bring his mask in if he stays we can use.

## 2016-01-06 ENCOUNTER — Encounter (HOSPITAL_COMMUNITY): Payer: Self-pay | Admitting: Gastroenterology

## 2016-01-06 LAB — CBC
HEMATOCRIT: 28 % — AB (ref 39.0–52.0)
Hemoglobin: 8.8 g/dL — ABNORMAL LOW (ref 13.0–17.0)
MCH: 29.3 pg (ref 26.0–34.0)
MCHC: 31.4 g/dL (ref 30.0–36.0)
MCV: 93.3 fL (ref 78.0–100.0)
Platelets: 202 10*3/uL (ref 150–400)
RBC: 3 MIL/uL — ABNORMAL LOW (ref 4.22–5.81)
RDW: 15.4 % (ref 11.5–15.5)
WBC: 5.3 10*3/uL (ref 4.0–10.5)

## 2016-01-06 LAB — GLUCOSE, CAPILLARY
GLUCOSE-CAPILLARY: 180 mg/dL — AB (ref 65–99)
GLUCOSE-CAPILLARY: 170 mg/dL — AB (ref 65–99)
Glucose-Capillary: 117 mg/dL — ABNORMAL HIGH (ref 65–99)
Glucose-Capillary: 127 mg/dL — ABNORMAL HIGH (ref 65–99)
Glucose-Capillary: 141 mg/dL — ABNORMAL HIGH (ref 65–99)
Glucose-Capillary: 185 mg/dL — ABNORMAL HIGH (ref 65–99)

## 2016-01-06 LAB — BASIC METABOLIC PANEL
ANION GAP: 6 (ref 5–15)
BUN: 12 mg/dL (ref 6–20)
CALCIUM: 8.7 mg/dL — AB (ref 8.9–10.3)
CO2: 25 mmol/L (ref 22–32)
Chloride: 111 mmol/L (ref 101–111)
Creatinine, Ser: 1.76 mg/dL — ABNORMAL HIGH (ref 0.61–1.24)
GFR calc Af Amer: 40 mL/min — ABNORMAL LOW (ref >60)
GFR calc non Af Amer: 35 mL/min — ABNORMAL LOW (ref >60)
GLUCOSE: 135 mg/dL — AB (ref 65–99)
Potassium: 4.2 mmol/L (ref 3.5–5.1)
Sodium: 142 mmol/L (ref 135–145)

## 2016-01-06 LAB — HEMOGLOBIN AND HEMATOCRIT, BLOOD
HEMATOCRIT: 25.8 % — AB (ref 39.0–52.0)
HEMOGLOBIN: 8.3 g/dL — AB (ref 13.0–17.0)

## 2016-01-06 MED ORDER — INSULIN ASPART 100 UNIT/ML ~~LOC~~ SOLN
0.0000 [IU] | Freq: Every day | SUBCUTANEOUS | Status: DC
  Administered 2016-01-13: 3 [IU] via SUBCUTANEOUS

## 2016-01-06 MED ORDER — SODIUM CHLORIDE 0.9 % IV SOLN: Freq: Once | INTRAVENOUS | Status: DC

## 2016-01-06 MED ORDER — INSULIN ASPART 100 UNIT/ML ~~LOC~~ SOLN
0.0000 [IU] | Freq: Three times a day (TID) | SUBCUTANEOUS | Status: DC
  Administered 2016-01-06 – 2016-01-07 (×4): 3 [IU] via SUBCUTANEOUS
  Administered 2016-01-08: 2 [IU] via SUBCUTANEOUS
  Administered 2016-01-08: 5 [IU] via SUBCUTANEOUS
  Administered 2016-01-08: 3 [IU] via SUBCUTANEOUS
  Administered 2016-01-09 (×2): 2 [IU] via SUBCUTANEOUS
  Administered 2016-01-09 – 2016-01-10 (×2): 3 [IU] via SUBCUTANEOUS
  Administered 2016-01-10 (×2): 2 [IU] via SUBCUTANEOUS
  Administered 2016-01-11 – 2016-01-12 (×5): 3 [IU] via SUBCUTANEOUS

## 2016-01-06 NOTE — Progress Notes (Signed)
Patient refusing CPAP for tonight. 

## 2016-01-06 NOTE — Progress Notes (Signed)
Progress Note   Subjective  Patient did well overnight after colonoscopy. He has had no further rectal bleeding. No abdominal pain. He passed one brown stool. Hgb slightly decreased today,   Objective   Vital signs in last 24 hours: Temp:  [97.9 F (36.6 C)-98.5 F (36.9 C)] 98.5 F (36.9 C) (02/22 0429) Pulse Rate:  [67-93] 78 (02/22 0429) Resp:  [9-122] 17 (02/22 0429) BP: (109-188)/(39-79) 140/65 mmHg (02/22 0429) SpO2:  [98 %-100 %] 100 % (02/22 0429) Weight:  [187 lb 2.7 oz (84.9 kg)] 187 lb 2.7 oz (84.9 kg) (02/22 0430) Last BM Date: 01/05/16 General:    white male in NAD Heart:  Regular rate and rhythm; no murmurs Lungs: Respirations even and unlabored, lungs CTA bilaterally Abdomen:  Soft, nontender and nondistended. Normal bowel sounds. Extremities:  Without edema. Neurologic:  Alert and oriented,  grossly normal neurologically. Psych:  Cooperative. Normal mood and affect.  Intake/Output from previous day: 02/21 0701 - 02/22 0700 In: -  Out: 1250 [Urine:1250] Intake/Output this shift:    Lab Results:  Recent Labs  01/05/16 0415 01/05/16 1531 01/06/16 0357  HGB 8.7* 8.8* 8.3*  HCT 27.4* 26.8* 25.8*   BMET  Recent Labs  01/04/16 0759 01/05/16 0415 01/06/16 0357  NA 144 140 142  K 3.9 3.7 4.2  CL 109 108 111  CO2 26 24 25   GLUCOSE 114* 122* 135*  BUN 23* 19 12  CREATININE 2.00* 1.89* 1.76*  CALCIUM 8.8* 8.7* 8.7*   LFT No results for input(s): PROT, ALBUMIN, AST, ALT, ALKPHOS, BILITOT, BILIDIR, IBILI in the last 72 hours. PT/INR No results for input(s): LABPROT, INR in the last 72 hours.  Studies/Results: No results found.     Assessment / Plan:   82 y/o male with history of CAD, with recurrent diverticular bleeding complicated by NSTEMI during his last admission in January. He is high risk for contrast nephropathy if IR embolization is done given his CKD, while he is a high operative risk for surgery given his CAD. In this light  his bleeding diverticulum was identified endoscopically in the left colon during last admission and treated with multiple hemostasis clips which stopped the bleeding, however unfortunately he had recurrence this past week and was re-admitted for suspected diverticular bleeding.   In discussion regarding treatment options, he wishes to avoid IR embolization and surgery if at all possible. Yesterday he completed a bowel prep and underwent an unsedated colonoscopy in which diverticulosis was noted throughout the colon, mild in the right colon and severe in the left, but without any bleeding or stigmata of bleeding. He has not had any further bleeding overnight but Hgb slightly downtrended from yesterday, likely due to repeated blood draws but would continue to monitor for bleeding.   At this time I think it is okay for him to eat and monitor for recurrent bleeding today. If he has recurrent bleeding, would recommend tagged RBC scan in hopes of localizing the source, which I suspect is the left colon. In this setting, if the tagged RBC scan is positive I would consult IR and general surgery, as I think the yield of future colonoscopy is low at this time. I defer to the primary team regarding PRBC transfusion, however given his cardiac history I would consider 1 unit PRBC transfusion today.   Please call with questions or changes in his status.   Elnora Cellar, MD [facility] Pager [phone]     LOS: 3 days  Renelda Loma Mishael Haran  01/06/2016, 7:21 AM

## 2016-01-06 NOTE — Progress Notes (Signed)
01/06/2016 patient had dark blood in stool at 1530, Dr Thereasa Solo was made aware. Rico Sheehan RN

## 2016-01-06 NOTE — Care Management Important Message (Signed)
Important Message  Patient Details  Name: [name] MRN: [id number] Date of Birth: 05-Jun-1932   Medicare Important Message Given:  Yes    Nathen May 01/06/2016, 12:08 PM

## 2016-01-06 NOTE — Progress Notes (Signed)
[place] TEAM 1 - Stepdown/ICU TEAM PROGRESS NOTE  [name] [id number] DOB: 1931-12-09 DOA: 01/03/2016 PCP: [name], MD  Admit HPI / Brief Narrative: 82 y.o. M Hx CAD, Chronic Diastolic CHF, HTN, RBBB, Atrial Flutter/Fibrillation, NSTEMI, Stage III CKD, DM2 with neuropathy, OSA on CPAP, and numerous episodes of previous Diverticular Bleeding who presented to the ED with BRBPR for 3-4 days. He denied ABD Pain, but reported chest pain at rest starting the night of admission. He was evaluated in the ED and was found to have a Hb of 7.2. He was hospitalized about one month prior for similar bleeding at which time his Kary Kos was stopped, however, he was kept on a baby aspirin.   HPI/Subjective: Resting comfortably.  He tolerated a diet today without difficulty.  Reports no bright red blood per rectum thus far today.  Denies chest pain shortness of breath nausea vomiting or abdominal pain.  Assessment/Plan:  Acute GI bleed / acute blood loss anemia - likely diverticular bleed, from L colon  -Nuclear medicine tagged red cell scan negative for source of bleeding -extensive diverticulosis but no obvious bleeding on colonoscopy on 2/21 - s/p 2U PRBC thus far  -repeat tagged cell scan with embolization or surgery if he has severe recurrent bleeding -Threshold for transfusion his hemoglobin less than 8.0 - recheck CBC tonight and if declining further we will transfuse 1 unit - recheck CBC in a.m.  Chest pain - CAD s/p NSTEMI 11/2015  - ECG at admission with T-wave inversions in the inferolateral leads, repeat EKG with right bundle branch block and persistent T-wave inversions in the lateral leads - POC trop neg, follow-up troponin negative - Chest pain resolved with blood transfusion - F/u with cardiology as outpatient - Tele: NSR with first degree block - Resume aspirin ASAP - Resume metoprolol and high dose statin  DM type II -Hemoglobin A1c 6.9 -CBG  reasonably controlled - follow -12/19/15 lipid panel within ADA guidelines  Combined chronic Systolic and Diastolic CHF - Wgt stable - no clinical evidence of volume overload -Resume lasix today  Atrial fibrillation - Hx had Ablation - normal sinus rhythm presently -No anticoagulants  Essential hypertension -Blood pressure currently reasonably controlled  Pulmonary hypertension - diuretics as above  CKD stage 3 - creatinine stable at approximately baseline  Code Status: FULL Family Communication: Discussed plan of care with patient and daughter at bedside Disposition Plan: SDU - possible discharge in next 24-48 hours if hemoglobin stable  Consultants: [facility]  Procedures: Colonoscopy 2/21  Antibiotics: none  DVT prophylaxis: SCDs  Objective: Blood pressure 126/63, pulse 69, temperature 97.8 F (36.6 C), temperature source Oral, resp. rate 20, height 5\' 9"  (1.753 m), weight 84.9 kg (187 lb 2.7 oz), SpO2 100 %.  Intake/Output Summary (Last 24 hours) at 01/06/16 1448 Last data filed at 01/06/16 1249  Gross per 24 hour  Intake    720 ml  Output   2100 ml  Net  -1380 ml     Exam: General: No acute respiratory distress Lungs: Clear to auscultation bilaterally without wheezes or crackles Cardiovascular: Regular rate and rhythm without murmur gallop or rub normal S1 and S2 Abdomen: Nontender, nondistended, soft, bowel sounds positive, no rebound, no ascites, no appreciable mass Extremities: No significant cyanosis, clubbing, or edema bilateral lower extremities  Data Reviewed:  Basic Metabolic Panel:  Recent Labs Lab 01/03/16 0235 01/03/16 0438 01/04/16 0759 01/05/16 0415 01/06/16 0357  NA 142 140 144 140 142  K 3.7 3.8  3.9 3.7 4.2  CL 100* 102 109 108 111  CO2 28 30 26 24 25   GLUCOSE 199* 163* 114* 122* 135*  BUN 35* 34* 23* 19 12  CREATININE 2.26* 2.16* 2.00* 1.89* 1.76*  CALCIUM 9.3 9.0 8.8* 8.7* 8.7*     CBC:  Recent Labs Lab 01/03/16 0235 01/03/16 0438  01/04/16 0320 01/04/16 1615 01/05/16 0415 01/05/16 1531 01/06/16 0357  WBC 7.0 7.2  --   --   --   --   --   --   HGB 7.2* 7.1*  < > 8.8* 9.2* 8.7* 8.8* 8.3*  HCT 23.2* 22.6*  < > 27.8* 29.3* 27.4* 26.8* 25.8*  MCV 93.9 94.2  --   --   --   --   --   --   PLT 189 182  --   --   --   --   --   --   < > = values in this interval not displayed.  Liver Function Tests:  Recent Labs Lab 01/03/16 0235  AST 22  ALT 18  ALKPHOS 57  BILITOT 0.5  PROT 6.2*  ALBUMIN 3.0*   Cardiac Enzymes:  Recent Labs Lab 01/04/16 0759  TROPONINI 0.03    CBG:  Recent Labs Lab 01/05/16 2037 01/05/16 2356 01/06/16 0424 01/06/16 0756 01/06/16 1157  GLUCAP 152* 117* 127* 141* 180*    Recent Results (from the past 240 hour(s))  MRSA PCR Screening     Status: None   Collection Time: 01/03/16  5:57 PM  Result Value Ref Range Status   MRSA by PCR NEGATIVE NEGATIVE Final    Comment:        The GeneXpert MRSA Assay (FDA approved for NASAL specimens only), is one component of a comprehensive MRSA colonization surveillance program. It is not intended to diagnose MRSA infection nor to guide or monitor treatment for MRSA infections.      Studies:   Recent x-ray studies have been reviewed in detail by the Attending Physician  Scheduled Meds:  Scheduled Meds: . aspirin EC  81 mg Oral Daily  . furosemide  80 mg Oral Daily  . insulin aspart  0-9 Units Subcutaneous 6 times per day  . isosorbide mononitrate  60 mg Oral BID WC  . metoprolol tartrate  12.5 mg Oral BID  . pantoprazole  40 mg Oral Q0600  . ranolazine  500 mg Oral BID  . rosuvastatin  40 mg Oral q1800    Time spent on care of this patient: 35 mins   MCCLUNG,JEFFREY T , MD   [facility] Office  [phone] Pager - Text Page per Shea Evans as per below:  On-Call/Text Page:      [email or web address]      password [email or web address]  If 7PM-7AM, please contact  night-coverage [email or web address] Password [email or web address] 01/06/2016, 2:48 PM   LOS: 3 days

## 2016-01-07 DIAGNOSIS — K5791 Diverticulosis of intestine, part unspecified, without perforation or abscess with bleeding: Secondary | ICD-10-CM

## 2016-01-07 DIAGNOSIS — K921 Melena: Secondary | ICD-10-CM

## 2016-01-07 LAB — CBC
HCT: 24.8 % — ABNORMAL LOW (ref 39.0–52.0)
Hemoglobin: 7.9 g/dL — ABNORMAL LOW (ref 13.0–17.0)
MCH: 29.8 pg (ref 26.0–34.0)
MCHC: 31.9 g/dL (ref 30.0–36.0)
MCV: 93.6 fL (ref 78.0–100.0)
PLATELETS: 188 10*3/uL (ref 150–400)
RBC: 2.65 MIL/uL — AB (ref 4.22–5.81)
RDW: 15.5 % (ref 11.5–15.5)
WBC: 7.1 10*3/uL (ref 4.0–10.5)

## 2016-01-07 LAB — COMPREHENSIVE METABOLIC PANEL
ALT: 19 U/L (ref 17–63)
ANION GAP: 5 (ref 5–15)
AST: 21 U/L (ref 15–41)
Albumin: 3 g/dL — ABNORMAL LOW (ref 3.5–5.0)
Alkaline Phosphatase: 60 U/L (ref 38–126)
BUN: 17 mg/dL (ref 6–20)
CALCIUM: 8.6 mg/dL — AB (ref 8.9–10.3)
CHLORIDE: 110 mmol/L (ref 101–111)
CO2: 27 mmol/L (ref 22–32)
CREATININE: 2.06 mg/dL — AB (ref 0.61–1.24)
GFR, EST AFRICAN AMERICAN: 33 mL/min — AB (ref >60)
GFR, EST NON AFRICAN AMERICAN: 29 mL/min — AB (ref >60)
Glucose, Bld: 142 mg/dL — ABNORMAL HIGH (ref 65–99)
Potassium: 4.2 mmol/L (ref 3.5–5.1)
Sodium: 142 mmol/L (ref 135–145)
Total Bilirubin: 0.3 mg/dL (ref 0.3–1.2)
Total Protein: 5.6 g/dL — ABNORMAL LOW (ref 6.5–8.1)

## 2016-01-07 LAB — GLUCOSE, CAPILLARY
GLUCOSE-CAPILLARY: 146 mg/dL — AB (ref 65–99)
Glucose-Capillary: 159 mg/dL — ABNORMAL HIGH (ref 65–99)
Glucose-Capillary: 171 mg/dL — ABNORMAL HIGH (ref 65–99)
Glucose-Capillary: 157 mg/dL — ABNORMAL HIGH (ref 65–99)

## 2016-01-07 LAB — PREPARE RBC (CROSSMATCH)

## 2016-01-07 MED ORDER — DIPHENHYDRAMINE HCL 25 MG PO CAPS
50.0000 mg | ORAL_CAPSULE | Freq: Once | ORAL | Status: AC
  Administered 2016-01-07: 50 mg via ORAL
  Filled 2016-01-07: qty 2

## 2016-01-07 MED ORDER — SODIUM CHLORIDE 0.9 % IV SOLN
Freq: Once | INTRAVENOUS | Status: AC
  Administered 2016-01-07: 08:00:00 via INTRAVENOUS

## 2016-01-07 NOTE — Progress Notes (Signed)
Pts hgb found to be at 7.9 at morning lab draw. MD made aware through [facility] paging system

## 2016-01-07 NOTE — Progress Notes (Signed)
01/07/2016 1315 patient had 2 pink  raise area on right buttocks. Patient stated area was itching. Rn notified Dr Allyson Sabal and order for one time dose of Benadryl 50 mg po was ordered.  Patient was given Benadryl at 1325 and was he was  reassess a hour after, but it was unchange. Rn reassess patient at 1425 itching was resolve. [facility] RN.

## 2016-01-07 NOTE — Progress Notes (Signed)
Progress Note   Subjective  Patient had done well yesterday AM and had not passed any red blood following colonoscopy, however the patient reports overnight he had one small BM with bright red blood. We were not notified. His Hgb is slightly lower this morning.    Objective   Vital signs in last 24 hours: Temp:  [97.5 F (36.4 C)-98.5 F (36.9 C)] 98.5 F (36.9 C) (02/23 0300) Pulse Rate:  [66-91] 76 (02/23 0300) Resp:  [13-22] 18 (02/23 0300) BP: (123-142)/(58-71) 135/67 mmHg (02/23 0300) SpO2:  [97 %-100 %] 100 % (02/23 0300) Weight:  [185 lb 4.8 oz (84.052 kg)] 185 lb 4.8 oz (84.052 kg) (02/23 0500) Last BM Date: 01/06/16 General:    white male in NAD Heart:  Regular rate and rhythm; no murmurs Lungs: Respirations even and unlabored, lungs CTA bilaterally Abdomen:  Soft, nontender and nondistended. Normal bowel sounds. Extremities:  (+) 1 edema LE B. Neurologic:  Alert and oriented,  grossly normal neurologically. Psych:  Cooperative. Normal mood and affect.  Intake/Output from previous day: 02/22 0701 - 02/23 0700 In: 720 [P.O.:720] Out: 1950 [Urine:1950] Intake/Output this shift:    Lab Results:  Recent Labs  01/06/16 0357 01/06/16 1635 01/07/16 0300  WBC  --  5.3 7.1  HGB 8.3* 8.8* 7.9*  HCT 25.8* 28.0* 24.8*  PLT  --  202 188   BMET  Recent Labs  01/05/16 0415 01/06/16 0357 01/07/16 0300  NA 140 142 142  K 3.7 4.2 4.2  CL 108 111 110  CO2 24 25 27   GLUCOSE 122* 135* 142*  BUN 19 12 17   CREATININE 1.89* 1.76* 2.06*  CALCIUM 8.7* 8.7* 8.6*   LFT  Recent Labs  01/07/16 0300  PROT 5.6*  ALBUMIN 3.0*  AST 21  ALT 19  ALKPHOS 60  BILITOT 0.3   PT/INR No results for input(s): LABPROT, INR in the last 72 hours.  Studies/Results: No results found.     Assessment / Plan:   82 y/o male with history of CAD, with recurrent diverticular bleeding complicated by NSTEMI during his last admission in January. He is high risk for contrast  nephropathy if IR embolization is done given his CKD, while he is a higher than average risk for surgery given his CAD. In this light his bleeding diverticulum was able to be identified endoscopically in the left colon during last admission and treated with multiple hemostasis clips which stopped the bleeding, however unfortunately he had recurrence and was re-admitted for suspected diverticular bleeding this week.   He completed a bowel prep and underwent an unsedated colonoscopy 2/21 in which diverticulosis was noted throughout the colon, mild in the right colon and severe in the left, but without any bleeding or stigmata of bleeding. He had not any further bleeding since that procedure passed blood again yesterday evening, we were not contacted about this, and Hgb slightly downtrended from yesterday. He had not passed any further blood since that time  At this time the yield of future colonoscopy for this issue is low. He has intermittent and recurrent bleeding, I suspect from the left colon based on his source during last admission and burden of diverticuli in the sigmoid, but he has pancolonic diverticulosis, with an negative tagged RBC scan this admission.   At this time recommend the following: - transfuse PRBC this morning (he has had demand ischemia, NSTEMI when anemic before in light of CAD) - please notify us if he has  any further bleeding per rectum in which scenario he should have a tagged RBC scan for localization  - general surgery consult - while the patient is stable, he has had multiple diverticular bleeds, including last month with NSTEMI due to demand ischemia, and he is high risk for future bleeding without definitive therapy. He is also higher than average risk for surgery as well but would like their opinion in this difficult case. The patient was strongly against IR attempts at embolization given the real risk of dialysis with contrast.  Please call me if he has further bleeding,  or with questions or changes in his status.   Jeremiah Cellar, MD [facility] Pager [phone]      LOS: 4 days   [name]  01/07/2016, 7:34 AM

## 2016-01-07 NOTE — Progress Notes (Signed)
[name] is a 82 y.o. male patient admitted from ED awake, alert - oriented  X 4 - no acute distress noted.  VSS - Blood pressure 136/60, pulse 75, temperature 98.3 F (36.8 C), temperature source Oral, resp. rate 18, height 5\' 9"  (1.753 m), weight 84.052 kg (185 lb 4.8 oz), SpO2 100 %.    IV in place, occlusive dsg intact without redness.  Orientation to room, and floor completed with information packet given to patient.  Patient declined safety video at this time.  Admission INP armband ID verified with patient, and in place.   SR up x 2, fall assessment complete, with patient and family able to verbalize understanding of risk associated with falls, and verbalized understanding to call nsg before up out of bed.  Call light within reach, patient able to voice, and demonstrate understanding.  Skin, clean-dry- intact without evidence of bruising, or skin tears.   No evidence of skin break down noted on exam.     Will continue to evaluate and treat per MD orders.  Elon Jester, RN 01/07/2016 8:15 PM

## 2016-01-07 NOTE — Progress Notes (Signed)
Report received from [facility]

## 2016-01-07 NOTE — Progress Notes (Signed)
[place] TEAM 1 - Stepdown/ICU TEAM PROGRESS NOTE  ELO ROULAND [id number] DOB: 11-Oct-1932 DOA: 01/03/2016 PCP: [name], MD  Admit HPI / Brief Narrative: 82 y.o. M Hx CAD, Chronic Diastolic CHF, HTN, RBBB, Atrial Flutter/Fibrillation, NSTEMI, Stage III CKD, DM2 with neuropathy, OSA on CPAP, and numerous episodes of previous Diverticular Bleeding who presented to the ED with BRBPR for 3-4 days. He denied ABD Pain, but reported chest pain at rest starting the night of admission. He was evaluated in the ED and was found to have a Hb of 7.2. He was hospitalized about one month prior for similar bleeding at which time his Kary Kos was stopped, however, he was kept on a baby aspirin.   HPI/Subjective: Resting comfortably.  He tolerated a diet today without difficulty.  Reports dark  blood per rectum  Yesterday evening   Assessment/Plan:  Acute GI bleed / acute blood loss anemia - likely diverticular bleed, from L colon  -Nuclear medicine tagged red cell scan on 2/19 negative for source of bleeding -extensive diverticulosis but no obvious bleeding on colonoscopy on 2/21 - s/p 2U PRBC thus far , hemoglobin slowly trending down 7.9 -repeat tagged cell scan with embolization or surgery if he has severe recurrent bleeding -Threshold for transfusion his hemoglobin less than 8.0 - therefore we'll transfuse patient 3rd  unit today  Chest pain - CAD s/p NSTEMI 11/2015  - ECG at admission with T-wave inversions in the inferolateral leads, repeat EKG with right bundle branch block and persistent T-wave inversions in the lateral leads - POC trop neg, follow-up troponin negative - Chest pain resolved with blood transfusion - F/u with cardiology as outpatient - Tele: NSR with first degree block - Resume aspirin ASAP - Resume metoprolol and high dose statin  DM type II -Hemoglobin A1c 6.9 -CBG reasonably controlled - follow -12/19/15 lipid panel within ADA  guidelines  Combined chronic Systolic and Diastolic CHF - Wgt stable - no clinical evidence of volume overload -Resume lasix today  Atrial fibrillation - Hx had Ablation - normal sinus rhythm presently -No anticoagulants  Essential hypertension -Blood pressure currently reasonably controlled  Pulmonary hypertension - diuretics as above  CKD stage 3 - creatinine stable at approximately baseline  Code Status: FULL Family Communication: Discussed plan of care with patient and daughter at bedside Disposition Plan: SDU - possible discharge in next 24 hours if hemoglobin stable, PT/OT eval  Consultants:  GI  Procedures: Colonoscopy 2/21  Antibiotics: none  DVT prophylaxis: SCDs  Objective: Blood pressure 135/67, pulse 76, temperature 98.5 F (36.9 C), temperature source Oral, resp. rate 18, height 5\' 9"  (1.753 m), weight 84.052 kg (185 lb 4.8 oz), SpO2 100 %.  Intake/Output Summary (Last 24 hours) at 01/07/16 0748 Last data filed at 01/06/16 1530  Gross per 24 hour  Intake    720 ml  Output   1950 ml  Net  -1230 ml     Exam: General: No acute respiratory distress Lungs: Clear to auscultation bilaterally without wheezes or crackles Cardiovascular: Regular rate and rhythm without murmur gallop or rub normal S1 and S2 Abdomen: Nontender, nondistended, soft, bowel sounds positive, no rebound, no ascites, no appreciable mass Extremities: No significant cyanosis, clubbing, or edema bilateral lower extremities  Data Reviewed:  Basic Metabolic Panel:  Recent Labs Lab 01/03/16 0438 01/04/16 0759 01/05/16 0415 01/06/16 0357 01/07/16 0300  NA 140 144 140 142 142  K 3.8 3.9 3.7 4.2 4.2  CL 102 109 108 111 110  CO2  30 26 24 25 27   GLUCOSE 163* 114* 122* 135* 142*  BUN 34* 23* 19 12 17   CREATININE 2.16* 2.00* 1.89* 1.76* 2.06*  CALCIUM 9.0 8.8* 8.7* 8.7* 8.6*    CBC:  Recent Labs Lab 01/03/16 0235  01/03/16 0438  01/05/16 0415 01/05/16 1531 01/06/16 0357 01/06/16 1635 01/07/16 0300  WBC 7.0 7.2  --   --   --   --  5.3 7.1  HGB 7.2* 7.1*  < > 8.7* 8.8* 8.3* 8.8* 7.9*  HCT 23.2* 22.6*  < > 27.4* 26.8* 25.8* 28.0* 24.8*  MCV 93.9 94.2  --   --   --   --  93.3 93.6  PLT 189 182  --   --   --   --  202 188  < > = values in this interval not displayed.  Liver Function Tests:  Recent Labs Lab 01/03/16 0235 01/07/16 0300  AST 22 21  ALT 18 19  ALKPHOS 57 60  BILITOT 0.5 0.3  PROT 6.2* 5.6*  ALBUMIN 3.0* 3.0*   Cardiac Enzymes:  Recent Labs Lab 01/04/16 0759  TROPONINI 0.03    CBG:  Recent Labs Lab 01/06/16 0424 01/06/16 0756 01/06/16 1157 01/06/16 1651 01/06/16 2055  GLUCAP 127* 141* 180* 185* 170*    Recent Results (from the past 240 hour(s))  MRSA PCR Screening     Status: None   Collection Time: 01/03/16  5:57 PM  Result Value Ref Range Status   MRSA by PCR NEGATIVE NEGATIVE Final    Comment:        The GeneXpert MRSA Assay (FDA approved for NASAL specimens only), is one component of a comprehensive MRSA colonization surveillance program. It is not intended to diagnose MRSA infection nor to guide or monitor treatment for MRSA infections.      Studies:   Recent x-ray studies have been reviewed in detail by the Attending Physician  Scheduled Meds:  Scheduled Meds: . aspirin EC  81 mg Oral Daily  . furosemide  80 mg Oral Daily  . insulin aspart  0-15 Units Subcutaneous TID WC  . insulin aspart  0-5 Units Subcutaneous QHS  . isosorbide mononitrate  60 mg Oral BID WC  . metoprolol tartrate  12.5 mg Oral BID  . pantoprazole  40 mg Oral Q0600  . ranolazine  500 mg Oral BID  . rosuvastatin  40 mg Oral q1800    Time spent on care of this patient: 63 mins   Reyne Dumas , MD   [facility] Office  [phone] Pager - Text Page per Shea Evans as per below:  On-Call/Text Page:      [email or web address]      password [email or web address]  If 7PM-7AM, please  contact night-coverage [email or web address] Password [facility] 01/07/2016, 7:48 AM   LOS: 4 days

## 2016-01-08 DIAGNOSIS — K2961 Other gastritis with bleeding: Secondary | ICD-10-CM

## 2016-01-08 DIAGNOSIS — Z8719 Personal history of other diseases of the digestive system: Secondary | ICD-10-CM | POA: Diagnosis present

## 2016-01-08 LAB — CBC
HEMATOCRIT: 26.8 % — AB (ref 39.0–52.0)
Hemoglobin: 8.8 g/dL — ABNORMAL LOW (ref 13.0–17.0)
MCH: 30.8 pg (ref 26.0–34.0)
MCHC: 32.8 g/dL (ref 30.0–36.0)
MCV: 93.7 fL (ref 78.0–100.0)
Platelets: 172 10*3/uL (ref 150–400)
RBC: 2.86 MIL/uL — AB (ref 4.22–5.81)
RDW: 15.6 % — ABNORMAL HIGH (ref 11.5–15.5)
WBC: 7.5 10*3/uL (ref 4.0–10.5)

## 2016-01-08 LAB — GLUCOSE, CAPILLARY
GLUCOSE-CAPILLARY: 156 mg/dL — AB (ref 65–99)
Glucose-Capillary: 234 mg/dL — ABNORMAL HIGH (ref 65–99)
Glucose-Capillary: 144 mg/dL — ABNORMAL HIGH (ref 65–99)
Glucose-Capillary: 135 mg/dL — ABNORMAL HIGH (ref 65–99)

## 2016-01-08 LAB — COMPREHENSIVE METABOLIC PANEL
ALT: 18 U/L (ref 17–63)
AST: 20 U/L (ref 15–41)
Albumin: 2.9 g/dL — ABNORMAL LOW (ref 3.5–5.0)
Alkaline Phosphatase: 56 U/L (ref 38–126)
Anion gap: 8 (ref 5–15)
BILIRUBIN TOTAL: 0.7 mg/dL (ref 0.3–1.2)
BUN: 19 mg/dL (ref 6–20)
CALCIUM: 8.8 mg/dL — AB (ref 8.9–10.3)
CO2: 26 mmol/L (ref 22–32)
CREATININE: 1.91 mg/dL — AB (ref 0.61–1.24)
Chloride: 104 mmol/L (ref 101–111)
GFR calc Af Amer: 36 mL/min — ABNORMAL LOW (ref >60)
GFR, EST NON AFRICAN AMERICAN: 31 mL/min — AB (ref >60)
Glucose, Bld: 151 mg/dL — ABNORMAL HIGH (ref 65–99)
Potassium: 3.7 mmol/L (ref 3.5–5.1)
Sodium: 138 mmol/L (ref 135–145)
TOTAL PROTEIN: 5.5 g/dL — AB (ref 6.5–8.1)

## 2016-01-08 LAB — TYPE AND SCREEN
ABO/RH(D): O NEG
Antibody Screen: POSITIVE
DAT, IgG: NEGATIVE
DONOR AG TYPE: NEGATIVE
UNIT DIVISION: 0

## 2016-01-08 NOTE — Progress Notes (Signed)
UR COMPLETED  

## 2016-01-08 NOTE — Progress Notes (Signed)
Visible blood in stool. Paged Dr Allyson Sabal. Waiting to hear back from dr. No complaints per pt. Pt A&O x4, ambulating around unit. Will continue to monitor pt frequently.

## 2016-01-08 NOTE — Progress Notes (Addendum)
[name] TEAM 1 - Stepdown/ICU TEAM PROGRESS NOTE  [name] [id number] DOB: 1932-09-14 DOA: 01/03/2016 PCP: [name], MD  Admit HPI / Brief Narrative: 82 y.o. M Hx CAD, Chronic Diastolic CHF, HTN, RBBB, Atrial Flutter/Fibrillation, NSTEMI, Stage III CKD, DM2 with neuropathy, OSA on CPAP, and numerous episodes of previous Diverticular Bleeding who presented to the ED with BRBPR for 3-4 days. He denied ABD Pain, but reported chest pain at rest starting the night of admission. He was evaluated in the ED and was found to have a Hb of 7.2. He was hospitalized about one month prior for similar bleeding at which time his Kary Kos was stopped, however, he was kept on a baby aspirin.   HPI/Subjective: Resting comfortably.  He tolerated a diet today without difficulty. one more episode of bright blood per rectum this morning, reported around 12 PM  Assessment/Plan:  Acute GI bleed / acute blood loss anemia - likely diverticular bleed, from L colon  -Nuclear medicine tagged red cell scan on 2/19 negative for source of bleeding -extensive diverticulosis but no obvious bleeding on colonoscopy on 2/21 -repeat tagged cell scan with embolization or surgery if he has severe recurrent bleeding -Threshold for transfusion his hemoglobin less than 8.0 -patient is status post transfusion of a total of 3 units If patient continues to have bleeding he will need surgical consultation,called   Chest pain - CAD s/p NSTEMI 11/2015  - ECG at admission with T-wave inversions in the inferolateral leads, repeat EKG with right bundle branch block and persistent T-wave inversions in the lateral leads - POC trop neg, follow-up troponin negative - Chest pain resolved with blood transfusion - F/u with cardiology as outpatient - Tele: NSR with first degree block - Resume aspirin  - Resume metoprolol and high dose statin  DM type II -Hemoglobin A1c 6.9 -CBG reasonably controlled -  follow -12/19/15 lipid panel within ADA guidelines  Combined chronic Systolic and Diastolic CHF - Wt stable - no clinical evidence of volume overload Continue Lasix  Atrial fibrillation He was deemed a Plavix nonresponder and was begun on Brilenta for his CAD by Dr Tamala Julian . He also has a remote history of PAF as well as diverticulitis and has had GI bleed on Coumadin. His oral anticoagulation was discontinued.  He was most  recently admitted on 11/13/15 for a GI bleed -Patient is not on anticoagulation at home due to his history of GI bleeding, as mentioned above  Cont asa for now   Essential hypertension -Blood pressure currently reasonably controlled  Pulmonary hypertension - diuretics as above  CKD stage 3 - creatinine stable at approximately baseline  Code Status: FULL Family Communication: Discussed plan of care with patient and daughter at bedside Disposition Plan: Continue telemetry, and anticipate discharge tomorrow if no bleeding overnight  Consultants: [facility]  Procedures: Colonoscopy 2/21  Antibiotics: none  DVT prophylaxis: SCDs  Objective: Blood pressure 112/62, pulse 85, temperature 98.4 F (36.9 C), temperature source Oral, resp. rate 18, height 5\' 9"  (1.753 m), weight 83.689 kg (184 lb 8 oz), SpO2 100 %.  Intake/Output Summary (Last 24 hours) at 01/08/16 1334 Last data filed at 01/07/16 1748  Gross per 24 hour  Intake    335 ml  Output    675 ml  Net   -340 ml     Exam: General: No acute respiratory distress Lungs: Clear to auscultation bilaterally without wheezes or crackles Cardiovascular: Regular rate and rhythm without murmur gallop or rub normal S1  and S2 Abdomen: Nontender, nondistended, soft, bowel sounds positive, no rebound, no ascites, no appreciable mass Extremities: No significant cyanosis, clubbing, or edema bilateral lower extremities  Data Reviewed:  Basic Metabolic Panel:  Recent  Labs Lab 01/04/16 0759 01/05/16 0415 01/06/16 0357 01/07/16 0300 01/08/16 0514  NA 144 140 142 142 138  K 3.9 3.7 4.2 4.2 3.7  CL 109 108 111 110 104  CO2 26 24 25 27 26   GLUCOSE 114* 122* 135* 142* 151*  BUN 23* 19 12 17 19   CREATININE 2.00* 1.89* 1.76* 2.06* 1.91*  CALCIUM 8.8* 8.7* 8.7* 8.6* 8.8*    CBC:  Recent Labs Lab 01/03/16 0235 01/03/16 0438  01/05/16 1531 01/06/16 0357 01/06/16 1635 01/07/16 0300 01/08/16 0514  WBC 7.0 7.2  --   --   --  5.3 7.1 7.5  HGB 7.2* 7.1*  < > 8.8* 8.3* 8.8* 7.9* 8.8*  HCT 23.2* 22.6*  < > 26.8* 25.8* 28.0* 24.8* 26.8*  MCV 93.9 94.2  --   --   --  93.3 93.6 93.7  PLT 189 182  --   --   --  202 188 172  < > = values in this interval not displayed.  Liver Function Tests:  Recent Labs Lab 01/03/16 0235 01/07/16 0300 01/08/16 0514  AST 22 21 20   ALT 18 19 18   ALKPHOS 57 60 56  BILITOT 0.5 0.3 0.7  PROT 6.2* 5.6* 5.5*  ALBUMIN 3.0* 3.0* 2.9*   Cardiac Enzymes:  Recent Labs Lab 01/04/16 0759  TROPONINI 0.03    CBG:  Recent Labs Lab 01/07/16 1237 01/07/16 1638 01/07/16 2115 01/08/16 0826 01/08/16 1213  GLUCAP 171* 157* 146* 156* 234*    Recent Results (from the past 240 hour(s))  MRSA PCR Screening     Status: None   Collection Time: 01/03/16  5:57 PM  Result Value Ref Range Status   MRSA by PCR NEGATIVE NEGATIVE Final    Comment:        The GeneXpert MRSA Assay (FDA approved for NASAL specimens only), is one component of a comprehensive MRSA colonization surveillance program. It is not intended to diagnose MRSA infection nor to guide or monitor treatment for MRSA infections.      Studies:   Recent x-ray studies have been reviewed in detail by the Attending Physician  Scheduled Meds:  Scheduled Meds: . aspirin EC  81 mg Oral Daily  . furosemide  80 mg Oral Daily  . insulin aspart  0-15 Units Subcutaneous TID WC  . insulin aspart  0-5 Units Subcutaneous QHS  . isosorbide mononitrate  60 mg  Oral BID WC  . metoprolol tartrate  12.5 mg Oral BID  . pantoprazole  40 mg Oral Q0600  . rosuvastatin  40 mg Oral q1800    Time spent on care of this patient: 29 mins   Reyne Dumas , MD   [facility] Office  [phone] Pager - Text Page per Shea Evans as per below:  On-Call/Text Page:      [email or web address]      password [email or web address]  If 7PM-7AM, please contact night-coverage [email or web address] Password [email or web address] 01/08/2016, 1:34 PM   LOS: 5 days

## 2016-01-08 NOTE — Progress Notes (Signed)
          Daily Rounding Note  01/08/2016, 10:44 AM  LOS: 5 days   SUBJECTIVE:       Feels well.  Last, small volume,  Blood PR was in PM 2/21, after colonoscopy.   OBJECTIVE:         Vital signs in last 24 hours:    Temp:  [97.7 F (36.5 C)-98.4 F (36.9 C)] 98.3 F (36.8 C) (02/24 0656) Pulse Rate:  [60-88] 70 (02/24 0902) Resp:  [13-29] 18 (02/24 0656) BP: (114-154)/(50-78) 114/59 mmHg (02/24 0902) SpO2:  [100 %] 100 % (02/24 0656) Weight:  [83.689 kg (184 lb 8 oz)] 83.689 kg (184 lb 8 oz) (02/24 0650) Last BM Date: 01/06/16 Filed Weights   01/06/16 0430 01/07/16 0500 01/08/16 0650  Weight: 84.9 kg (187 lb 2.7 oz) 84.052 kg (185 lb 4.8 oz) 83.689 kg (184 lb 8 oz)   General: pleasant, looks comfortable, not acutely ill looking   Heart: Irreg, irreg, rate controlled.  Chest: clear bil.  Abdomen: soft, NT, ND.  Active BS  Extremities: no CCE Neuro/Psych:  Oriented x 3.  No   Intake/Output from previous day: 02/23 0701 - 02/24 0700 In: 695 [P.O.:360; Blood:335] Out: [id number] [Urine:1650]  Intake/Output this shift:    Lab Results:  Recent Labs  01/06/16 1635 01/07/16 0300 01/08/16 0514  WBC 5.3 7.1 7.5  HGB 8.8* 7.9* 8.8*  HCT 28.0* 24.8* 26.8*  PLT 202 188 172   BMET  Recent Labs  01/06/16 0357 01/07/16 0300 01/08/16 0514  NA 142 142 138  K 4.2 4.2 3.7  CL 111 110 104  CO2 25 27 26   GLUCOSE 135* 142* 151*  BUN 12 17 19   CREATININE 1.76* 2.06* 1.91*  CALCIUM 8.7* 8.6* 8.8*   LFT  Recent Labs  01/07/16 0300 01/08/16 0514  PROT 5.6* 5.5*  ALBUMIN 3.0* 2.9*  AST 21 20  ALT 19 18  ALKPHOS 60 56  BILITOT 0.3 0.7   PT/INR No results for input(s): LABPROT, INR in the last 72 hours. Hepatitis Panel No results for input(s): HEPBSAG, HCVAB, HEPAIGM, HEPBIGM in the last 72 hours.  Studies/Results: No results found.  ASSESMENT:   *  Chronic,recurrent LGI bleeding.  Likely diverticular.  Mgt  complicated by CKD (trying to avoid arteriogram/embolization), CAD (high surgical risk).   *  Blood loss anemia.   S/p PRBC x 3 (2 on 2/19, 1 on 2/23)   PLAN   *  Per MD.  *  Given cessation of BPR, doubt surgery will entertain any surgical intervention, so not sure calling them in is necessary.   *  Given he is now off CVA prophylaxis but has ongoing a fib, his CVA risk is accelerated.  He is listed as full code.  If he has big CVA, pt may want to choose non-aggressive mgt.  Consider Palliative care, goals of care consult.       [name]  01/08/2016, 10:44 AM Pager: [phone]

## 2016-01-08 NOTE — Consult Note (Signed)
Reason for Consult:Diverticular LGIB  Referring Physician: [name] is an 82 y.o. male.  HPI: [name] has a long history of pan colonic diverticulosis. He has had several associated lower GI bleeds in the past. Prior to this admission, he was admitted the first week in January for lower GI bleed. This admission, he became anemic from it and very dizzy at home and last 'Sunday was brought by EMS and admitted. He has undergone further workup by Dr. Armbruster from GI. Tagged red blood cell scan on 2/19 was negative. Colonoscopy on 2/22 showed diverticuli but no blood and no evidence of active bleeding. He has had one episode of blood per rectum since then and one episode of some blood on toilet tissue today. We are asked to see him in consultation for consideration of surgery. He has been refusing the option of surgery this week but said today he would consider talking to us.  Past Medical History  Diagnosis Date  . Anemia     Secondary to acute blood loss  . Hypertension   . Hyperlipidemia   . RBBB (right bundle branch block)   . Age-related macular degeneration, wet, both eyes ([name])   . Carotid artery disease ([name])     Doppler 09/18/2009 - 49% bilateral stenoses  . Personal history of colonic polyps   . Diverticulosis of colon with hemorrhage 2009    several unit diverticular bleed   . Erectile dysfunction     Mild  . Atrial flutter ([name]) 07/2010    September, 2011   Hospital with PNA and cath done...Coumadin.  Atrial flutter ablation planned, but  pt. then had atrial fibrillation,/outpatient conversion 09/08/10..NSR..plan to follow..Dr. Klein  . Gout   . Skin cancer     R lower leg, per derm 2012  . Atrial fibrillation ([name])     Consideration was given for atrial flutter ablation, but patient developed atrial fibrillation. Cardioversion was done. Dr. Klein decided to watch him clinically. November, 2011  . Carotid artery disease ([name])     49'$ % bilateral, Doppler,  November, 2010  . CAD (coronary artery disease)     Catheterization, September, 2011,  grafts patent from redo CABG,, medical therapy of coronary disease, consideration to proceeding with atrial flutter ablation  . Ejection fraction     EF 60%, echo, 2009  //   EF 65%, echo, September, 2011  . Mitral regurgitation     Mild, echo, September, 2011  . Shoulder pain     "positional; better now" (11/13/2015)  . Anginal pain ([place])   . PNA (pneumonia) 9/11    NSTEMI at [facility] with repeat cath, rec medical mgmt   . NSTEMI (non-ST elevated myocardial infarction) ([place]) 07/2010    at [facility] with repeat cath, rec medical mgmt   . OSA on CPAP   . Type II diabetes mellitus ([place])   . Diabetic peripheral neuropathy ([place])   . History of blood transfusion "several"    "related to diverticular bleeding"  . Arthritis     "mild in hands, knees, ankles" (11/13/2015)  . Chronic kidney disease (CKD), stage III (moderate)     Past Surgical History  Procedure Laterality Date  . Cardiac catheterization  2006    Nuclear..slight lateral ischemia..medical therapy  . Cardiac catheterization  08/04/2010    grafts patent from redo CABG...medical Rx and ablate Atrial flutter (LV not injected)   . Doppler echocardiography  08/2008    EF 60%  . Doppler echocardiography  08/02/2010    65-70%  . Doppler echocardiography  07/2010    MR mild  . Coronary artery bypass graft  1995; 2006    "X 3; X3"  . Colonoscopy w/ polypectomy    . Cataract extraction w/ intraocular lens  implant, bilateral Bilateral   . Vasectomy    . Tonsillectomy    . Carotid endarterectomy Right 1994  . Coronary artery bypass graft  1995, 2006  . Esophagogastroduodenoscopy N/A 06/19/2014    Procedure: ESOPHAGOGASTRODUODENOSCOPY (EGD);  Surgeon: Jerene Bears, MD;  Location: [facility] ENDOSCOPY;  Service: Endoscopy;  Laterality: N/A;  . Left heart catheterization with coronary/graft angiogram N/A 06/11/2014    Procedure: LEFT HEART CATHETERIZATION WITH  Beatrix Fetters;  Surgeon: Sinclair Grooms, MD;  Location: [facility] CATH LAB;  Service: Cardiovascular;  Laterality: N/A;  . Percutaneous coronary stent intervention (pci-s) N/A 06/13/2014    Procedure: PERCUTANEOUS CORONARY STENT INTERVENTION (PCI-S);  Surgeon: Sinclair Grooms, MD;  Location: [facility] CATH LAB;  Service: Cardiovascular;  Laterality: N/A;  . Left heart cath N/A 06/14/2014    Procedure: LEFT HEART CATH;  Surgeon: Sinclair Grooms, MD;  Location: [facility] CATH LAB;  Service: Cardiovascular;  Laterality: N/A;  . Laparoscopic cholecystectomy  2008  . Skin cancer excision Left 10/2015    calf  . Skin cancer excision Right 2014?    chest  . Cardioversion  ~ 2010  . Colonoscopy N/A 11/14/2015    Procedure: COLONOSCOPY;  Surgeon: Manus Gunning, MD;  Location: [place];  Service: Gastroenterology;  Laterality: N/A;  . Esophagogastroduodenoscopy N/A 11/14/2015    Procedure: ESOPHAGOGASTRODUODENOSCOPY (EGD);  Surgeon: Manus Gunning, MD;  Location: [place];  Service: Gastroenterology;  Laterality: N/A;  . Flexible sigmoidoscopy N/A 11/16/2015    Procedure: FLEXIBLE SIGMOIDOSCOPY;  Surgeon: Manus Gunning, MD;  Location: [place];  Service: Gastroenterology;  Laterality: N/A;  . Colonoscopy Left 01/05/2016    Procedure: COLONOSCOPY;  Surgeon: Manus Gunning, MD;  Location: [place];  Service: Gastroenterology;  Laterality: Left;  no sedation to start, moderate if needed    Family History  Problem Relation Age of Onset  . Kidney disease Mother     Kidney failure  . Stroke Mother   . Diabetes Mother   . Heart disease Father     MI  . Arthritis Sister   . Cancer Sister     Throat  . Heart disease Sister     MI  . Diabetes Sister   . Prostate cancer Neg Hx   . Colon cancer Neg Hx     Social History:  reports that he has quit smoking. His smoking use included Cigarettes. He has a 8 pack-year smoking history. He has never used smokeless  tobacco. He reports that he does not drink alcohol or use illicit drugs.  Allergies: No Known Allergies  Medications:  Scheduled: . aspirin EC  81 mg Oral Daily  . furosemide  80 mg Oral Daily  . insulin aspart  0-15 Units Subcutaneous TID WC  . insulin aspart  0-5 Units Subcutaneous QHS  . isosorbide mononitrate  60 mg Oral BID WC  . metoprolol tartrate  12.5 mg Oral BID  . pantoprazole  40 mg Oral Q0600  . rosuvastatin  40 mg Oral q1800   Continuous:  EPP:IRJJOACZYSAYT **OR** acetaminophen, HYDROmorphone (DILAUDID) injection, nitroGLYCERIN, ondansetron **OR** ondansetron (ZOFRAN) IV, oxyCODONE  Results for orders placed or performed during the hospital encounter of 01/03/16 (from the past 48 hour(s))  Glucose, capillary     Status: Abnormal   Collection Time: 01/06/16  4:51 PM  Result Value Ref Range   Glucose-Capillary 185 (H) 65 - 99 mg/dL  Glucose, capillary     Status: Abnormal   Collection Time: 01/06/16  8:55 PM  Result Value Ref Range   Glucose-Capillary 170 (H) 65 - 99 mg/dL  CBC     Status: Abnormal   Collection Time: 01/07/16  3:00 AM  Result Value Ref Range   WBC 7.1 4.0 - 10.5 K/uL   RBC 2.65 (L) 4.22 - 5.81 MIL/uL   Hemoglobin 7.9 (L) 13.0 - 17.0 g/dL   HCT 24.8 (L) 39.0 - 52.0 %   MCV 93.6 78.0 - 100.0 fL   MCH 29.8 26.0 - 34.0 pg   MCHC 31.9 30.0 - 36.0 g/dL   RDW 15.5 11.5 - 15.5 %   Platelets 188 150 - 400 K/uL  Comprehensive metabolic panel     Status: Abnormal   Collection Time: 01/07/16  3:00 AM  Result Value Ref Range   Sodium 142 135 - 145 mmol/L   Potassium 4.2 3.5 - 5.1 mmol/L   Chloride 110 101 - 111 mmol/L   CO2 27 22 - 32 mmol/L   Glucose, Bld 142 (H) 65 - 99 mg/dL   BUN 17 6 - 20 mg/dL   Creatinine, Ser 2.06 (H) 0.61 - 1.24 mg/dL   Calcium 8.6 (L) 8.9 - 10.3 mg/dL   Total Protein 5.6 (L) 6.5 - 8.1 g/dL   Albumin 3.0 (L) 3.5 - 5.0 g/dL   AST 21 15 - 41 U/L   ALT 19 17 - 63 U/L   Alkaline Phosphatase 60 38 - 126 U/L   Total Bilirubin  0.3 0.3 - 1.2 mg/dL   GFR calc non Af Amer 29 (L) >60 mL/min   GFR calc Af Amer 33 (L) >60 mL/min    Comment: (NOTE) The eGFR has been calculated using the CKD EPI equation. This calculation has not been validated in all clinical situations. eGFR's persistently <60 mL/min signify possible Chronic Kidney Disease.    Anion gap 5 5 - 15  Glucose, capillary     Status: Abnormal   Collection Time: 01/07/16  8:12 AM  Result Value Ref Range   Glucose-Capillary 159 (H) 65 - 99 mg/dL  Prepare RBC     Status: None   Collection Time: 01/07/16  8:16 AM  Result Value Ref Range   Order Confirmation ORDER PROCESSED BY BLOOD BANK   Type and screen [place]     Status: None   Collection Time: 01/07/16  8:16 AM  Result Value Ref Range   ABO/RH(D) O NEG    Antibody Screen POS    Sample Expiration 01/10/2016    DAT, IgG NEG    Antibody Identification ANTI K    Unit Number [id number]    Blood Component Type RED CELLS,LR    Unit division 00    Status of Unit ISSUED,FINAL    Donor AG Type NEGATIVE FOR KELL ANTIGEN    Transfusion Status OK TO TRANSFUSE    Crossmatch Result COMPATIBLE   Glucose, capillary     Status: Abnormal   Collection Time: 01/07/16 12:37 PM  Result Value Ref Range   Glucose-Capillary 171 (H) 65 - 99 mg/dL  Glucose, capillary     Status: Abnormal   Collection Time: 01/07/16  4:38 PM  Result Value Ref Range   Glucose-Capillary 157 (H) 65 - 99 mg/dL  Glucose, capillary     Status: Abnormal   Collection Time: 01/07/16  9:15 PM  Result Value Ref Range   Glucose-Capillary 146 (H) 65 - 99 mg/dL   Comment 1 Notify RN    Comment 2 Document in Chart   CBC     Status: Abnormal   Collection Time: 01/08/16  5:14 AM  Result Value Ref Range   WBC 7.5 4.0 - 10.5 K/uL   RBC 2.86 (L) 4.22 - 5.81 MIL/uL   Hemoglobin 8.8 (L) 13.0 - 17.0 g/dL   HCT 26.8 (L) 39.0 - 52.0 %   MCV 93.7 78.0 - 100.0 fL   MCH 30.8 26.0 - 34.0 pg   MCHC 32.8 30.0 - 36.0 g/dL   RDW  15.6 (H) 11.5 - 15.5 %   Platelets 172 150 - 400 K/uL  Comprehensive metabolic panel     Status: Abnormal   Collection Time: 01/08/16  5:14 AM  Result Value Ref Range   Sodium 138 135 - 145 mmol/L   Potassium 3.7 3.5 - 5.1 mmol/L   Chloride 104 101 - 111 mmol/L   CO2 26 22 - 32 mmol/L   Glucose, Bld 151 (H) 65 - 99 mg/dL   BUN 19 6 - 20 mg/dL   Creatinine, Ser 1.91 (H) 0.61 - 1.24 mg/dL   Calcium 8.8 (L) 8.9 - 10.3 mg/dL   Total Protein 5.5 (L) 6.5 - 8.1 g/dL   Albumin 2.9 (L) 3.5 - 5.0 g/dL   AST 20 15 - 41 U/L   ALT 18 17 - 63 U/L   Alkaline Phosphatase 56 38 - 126 U/L   Total Bilirubin 0.7 0.3 - 1.2 mg/dL   GFR calc non Af Amer 31 (L) >60 mL/min   GFR calc Af Amer 36 (L) >60 mL/min    Comment: (NOTE) The eGFR has been calculated using the CKD EPI equation. This calculation has not been validated in all clinical situations. eGFR's persistently <60 mL/min signify possible Chronic Kidney Disease.    Anion gap 8 5 - 15  Glucose, capillary     Status: Abnormal   Collection Time: 01/08/16  8:26 AM  Result Value Ref Range   Glucose-Capillary 156 (H) 65 - 99 mg/dL  Glucose, capillary     Status: Abnormal   Collection Time: 01/08/16 12:13 PM  Result Value Ref Range   Glucose-Capillary 234 (H) 65 - 99 mg/dL    No results found.  Review of Systems  Constitutional: Negative.   HENT: Negative.   Eyes: Negative for blurred vision.  Respiratory: Negative for cough.   Cardiovascular: Negative for chest pain.  Gastrointestinal: Positive for blood in stool.  Genitourinary: Negative.   Musculoskeletal: Negative.   Skin: Negative.   Neurological: Negative.   Endo/Heme/Allergies: Negative.   Psychiatric/Behavioral: Negative.    Blood pressure 112/62, pulse 85, temperature 98.4 F (36.9 C), temperature source Oral, resp. rate 18, height '5\' 9"'$  (1.753 m), weight 83.689 kg (184 lb 8 oz), SpO2 100 %. Physical Exam  Constitutional: He is oriented to person, place, and time. He  appears well-developed and well-nourished.  HENT:  Head: Normocephalic.  Nose: Nose normal.  Mouth/Throat: Oropharynx is clear and moist.  Eyes: EOM are normal. Pupils are equal, round, and reactive to light.  Neck: Neck supple. No tracheal deviation present.  Cardiovascular: Normal rate and normal heart sounds.   Respiratory: Effort normal and breath sounds normal. No respiratory distress. He has no wheezes.  GI: Soft. He exhibits no distension. There  is no tenderness. There is no rebound and no guarding.  Small umbilical hernia is reducible  Musculoskeletal: Normal range of motion.  Neurological: He is alert and oriented to person, place, and time.  Skin: Skin is warm.  Psychiatric: He has a normal mood and affect.    Assessment/Plan: Diverticular lower GI bleed - workup this admission has failed to localize the bleeding. The patient reports he previously had some clips placed in his left colon. Before consideration of any surgery, he will need cardiology consultation for risk stratification. He does have a significant history of coronary artery disease, coronary artery bypass 2 separate occasions as well as myocardial infarction. He remains undecided regarding the consideration of surgery. We will follow along.  Caius Silbernagel E 01/08/2016, 4:49 PM

## 2016-01-08 NOTE — Consult Note (Signed)
   [facility] Emergency Room Inpatient Consult   01/08/2016  [name] 1932/08/07 [id number] Patient was screened for [place] Management services.  Patient is eligible for [facility] Care Management services through his Medicare insurance.Spoke with inpatient RNCM, Levada Dy.  She states that the patient will be assessed for discharge planning and she will follow up with this writer for any [facility] Care Management needs.  Please contact for needs: Natividad Brood, RN [name] [facility] Liaison  [phone] business mobile phone Toll free office [phone]

## 2016-01-09 LAB — COMPREHENSIVE METABOLIC PANEL
ALBUMIN: 3.2 g/dL — AB (ref 3.5–5.0)
ALK PHOS: 60 U/L (ref 38–126)
ALT: 20 U/L (ref 17–63)
ANION GAP: 12 (ref 5–15)
AST: 23 U/L (ref 15–41)
BILIRUBIN TOTAL: 0.7 mg/dL (ref 0.3–1.2)
BUN: 23 mg/dL — ABNORMAL HIGH (ref 6–20)
CALCIUM: 9.1 mg/dL (ref 8.9–10.3)
CO2: 26 mmol/L (ref 22–32)
CREATININE: 1.93 mg/dL — AB (ref 0.61–1.24)
Chloride: 104 mmol/L (ref 101–111)
GFR calc Af Amer: 36 mL/min — ABNORMAL LOW (ref >60)
GFR calc non Af Amer: 31 mL/min — ABNORMAL LOW (ref >60)
GLUCOSE: 167 mg/dL — AB (ref 65–99)
Potassium: 3.7 mmol/L (ref 3.5–5.1)
SODIUM: 142 mmol/L (ref 135–145)
TOTAL PROTEIN: 6.2 g/dL — AB (ref 6.5–8.1)

## 2016-01-09 LAB — CBC
HEMATOCRIT: 28.1 % — AB (ref 39.0–52.0)
HEMATOCRIT: 27.5 % — AB (ref 39.0–52.0)
HEMOGLOBIN: 8.9 g/dL — AB (ref 13.0–17.0)
Hemoglobin: 8.7 g/dL — ABNORMAL LOW (ref 13.0–17.0)
MCH: 29.2 pg (ref 26.0–34.0)
MCH: 29.1 pg (ref 26.0–34.0)
MCHC: 31.7 g/dL (ref 30.0–36.0)
MCHC: 31.6 g/dL (ref 30.0–36.0)
MCV: 92.1 fL (ref 78.0–100.0)
MCV: 92 fL (ref 78.0–100.0)
PLATELETS: 212 10*3/uL (ref 150–400)
Platelets: 212 10*3/uL (ref 150–400)
RBC: 3.05 MIL/uL — AB (ref 4.22–5.81)
RBC: 2.99 MIL/uL — ABNORMAL LOW (ref 4.22–5.81)
RDW: 15.3 % (ref 11.5–15.5)
RDW: 15.1 % (ref 11.5–15.5)
WBC: 8.2 10*3/uL (ref 4.0–10.5)
WBC: 7 10*3/uL (ref 4.0–10.5)

## 2016-01-09 LAB — GLUCOSE, CAPILLARY
Glucose-Capillary: 153 mg/dL — ABNORMAL HIGH (ref 65–99)
Glucose-Capillary: 143 mg/dL — ABNORMAL HIGH (ref 65–99)
Glucose-Capillary: 134 mg/dL — ABNORMAL HIGH (ref 65–99)
Glucose-Capillary: 187 mg/dL — ABNORMAL HIGH (ref 65–99)

## 2016-01-09 NOTE — Progress Notes (Signed)
4 Days Post-Op  Subjective: He has not had a BM, and is anxious about pending BM.    Objective: Vital signs in last 24 hours: Temp:  [97.8 F (36.6 C)-98.6 F (37 C)] 97.8 F (36.6 C) (02/25 0651) Pulse Rate:  [79-85] 81 (02/25 0651) Resp:  [18] 18 (02/25 0651) BP: (112-152)/(60-62) 152/60 mmHg (02/25 0651) SpO2:  [95 %-100 %] 100 % (02/25 0651) Weight:  [83.3 kg (183 lb 10.3 oz)] 83.3 kg (183 lb 10.3 oz) (02/25 0651) Last BM Date: 01/08/16 Afebrile, VSS Labs stable Intake/Output from previous day: 02/24 0701 - 02/25 0700 In: 340 [P.O.:340] Out: -  Intake/Output this shift:    General appearance: alert, cooperative and no distress GI: soft, non-tender; bowel sounds normal; no masses,  no organomegaly  Lab Results:   Recent Labs  01/08/16 0514 01/09/16 0614  WBC 7.5 8.2  HGB 8.8* 8.9*  HCT 26.8* 28.1*  PLT 172 212    BMET  Recent Labs  01/08/16 0514 01/09/16 0614  NA 138 142  K 3.7 3.7  CL 104 104  CO2 26 26  GLUCOSE 151* 167*  BUN 19 23*  CREATININE 1.91* 1.93*  CALCIUM 8.8* 9.1   PT/INR No results for input(s): LABPROT, INR in the last 72 hours.   Recent Labs Lab 01/03/16 0235 01/07/16 0300 01/08/16 0514 01/09/16 0614  AST 22 21 20 23   ALT 18 19 18 20   ALKPHOS 57 60 56 60  BILITOT 0.5 0.3 0.7 0.7  PROT 6.2* 5.6* 5.5* 6.2*  ALBUMIN 3.0* 3.0* 2.9* 3.2*     Lipase     Component Value Date/Time   LIPASE 29 09/01/2008 2100     Studies/Results: No results found.  Medications: . aspirin EC  81 mg Oral Daily  . furosemide  80 mg Oral Daily  . insulin aspart  0-15 Units Subcutaneous TID WC  . insulin aspart  0-5 Units Subcutaneous QHS  . isosorbide mononitrate  60 mg Oral BID WC  . metoprolol tartrate  12.5 mg Oral BID  . pantoprazole  40 mg Oral Q0600  . rosuvastatin  40 mg Oral q1800    Assessment/Plan Recurrent Diverticular GI bleed Chest pin with NSTEMI [id number] Hx of systolic/diastolic CHF AODM Hypertension Chronic kidney  disease, Stage III  Plan:  We will continue to follow.  Still no defined source of bleeding.  Dr. Grandville Silos recommends Cardiology consult before any plans for surgery.    LOS: 6 days    [name] 01/09/2016

## 2016-01-09 NOTE — Progress Notes (Signed)
Patient continues to refuse CPAP 

## 2016-01-09 NOTE — Progress Notes (Signed)
[facility] Progress Note  Patient: [name] [id number]   PCP: [name], MD DOB: 1932/05/21   DOA: 01/03/2016   DOS: 01/09/2016   Date of Service: the patient was seen and examined on 01/09/2016  Subjective: Patient did have one episode of bright red blood per rectum. No abdominal pain and nausea and vomiting tolerating oral diet ambulating well.  Assessment and Plan: 1. GI bleed Presents with complaints of recurrent GI bleed. Patient has multiple admissions this months. Recent colonoscopy did show a left-sided diverticular bleed. This admission his nuclear scan is negative. Colonoscopy is also negative. GI recommended surgical evaluation. Since the patient does not have any evidence of localized bleeding surgery is recommending close observation at present. Patient's H&H remained stable. We'll continue to monitor H&H as well as evidence of further bleeding if the patient has further evidence of bleeding we will repeat a nuclear tagged RBC scan.  2. Chest pain with coronary artery disease as well as non-STEMI. Troponins are negative. Chest pain improved with blood transfusion. Patient will need to follow up with cardiology as an outpatient. Continuing metoprolol and statin. Low-dose aspirin given his coronary artery disease.  3. Type 2 diabetes mellitus. Hemoglobin A1c 6.9. Sliding scale insulin.  4. Chronic combined CHF. Appears to be euvolemic. Continuing Lasix at home doses.  5. Essential hypertension. Blood pressure is well controlled. Continuing home medications.  6. History of A. fib. At no anticoagulation due to recurrent GI bleed. Currently normal sinus rhythm  DVT Prophylaxis: mechanical compression device Nutrition: regular diet Advance goals of care discussion: full code  Brief Summary of Hospitalization:  Procedures: Colonoscopy Consultants: Gastroenterology, general surgery Antibiotics: Anti-infectives    None      Family  Communication: no family was present at bedside, at the time of interview.   Disposition:  Expected discharge date: 01/10/2016 Barriers to safe discharge: Stabilization of H&H and bleeding   Intake/Output Summary (Last 24 hours) at 01/09/16 1550 Last data filed at 01/09/16 1300  Gross per 24 hour  Intake    700 ml  Output      0 ml  Net    700 ml   Filed Weights   01/07/16 0500 01/08/16 0650 01/09/16 0651  Weight: 84.052 kg (185 lb 4.8 oz) 83.689 kg (184 lb 8 oz) 83.3 kg (183 lb 10.3 oz)    Objective: Physical Exam: Filed Vitals:   01/08/16 2048 01/09/16 0651 01/09/16 1009 01/09/16 1400  BP: 138/62 152/60 118/57 119/60  Pulse: 79 81 78 109  Temp: 98.6 F (37 C) 97.8 F (36.6 C)  98.2 F (36.8 C)  TempSrc: Oral Oral  Oral  Resp: 18 18  20   Height:      Weight:  83.3 kg (183 lb 10.3 oz)    SpO2: 95% 100%  100%     General: Appear in mild distress, no Rash; Oral Mucosa moist Cardiovascular: S1 and S2 Present, no Murmur, no JVD Respiratory: Bilateral Air entry present and Clear to Auscultation, no Crackles, no wheezes Abdomen: Bowel Sound present, Soft and no tenderness Extremities: no Pedal edema, no calf tenderness Neurology: Grossly no focal neuro deficit.  Data Reviewed: CBC:  Recent Labs Lab 01/03/16 0438  01/06/16 0357 01/06/16 1635 01/07/16 0300 01/08/16 0514 01/09/16 0614  WBC 7.2  --   --  5.3 7.1 7.5 8.2  HGB 7.1*  < > 8.3* 8.8* 7.9* 8.8* 8.9*  HCT 22.6*  < > 25.8* 28.0* 24.8* 26.8* 28.1*  MCV 94.2  --   --  93.3 93.6 93.7 92.1  PLT 182  --   --  202 188 172 212  < > = values in this interval not displayed. Basic Metabolic Panel:  Recent Labs Lab 01/05/16 0415 01/06/16 0357 01/07/16 0300 01/08/16 0514 01/09/16 0614  NA 140 142 142 138 142  K 3.7 4.2 4.2 3.7 3.7  CL 108 111 110 104 104  CO2 24 25 27 26 26   GLUCOSE 122* 135* 142* 151* 167*  BUN 19 12 17 19  23*  CREATININE 1.89* 1.76* 2.06* 1.91* 1.93*  CALCIUM 8.7* 8.7* 8.6* 8.8* 9.1    Liver Function Tests:  Recent Labs Lab 01/03/16 0235 01/07/16 0300 01/08/16 0514 01/09/16 0614  AST 22 21 20 23   ALT 18 19 18 20   ALKPHOS 57 60 56 60  BILITOT 0.5 0.3 0.7 0.7  PROT 6.2* 5.6* 5.5* 6.2*  ALBUMIN 3.0* 3.0* 2.9* 3.2*   No results for input(s): LIPASE, AMYLASE in the last 168 hours. No results for input(s): AMMONIA in the last 168 hours.  Cardiac Enzymes:  Recent Labs Lab 01/04/16 0759  TROPONINI 0.03    BNP (last 3 results) No results for input(s): BNP in the last 8760 hours.  CBG:  Recent Labs Lab 01/08/16 1213 01/08/16 1723 01/08/16 2046 01/09/16 0756 01/09/16 1159  GLUCAP 234* 144* 135* 153* 143*    Recent Results (from the past 240 hour(s))  MRSA PCR Screening     Status: None   Collection Time: 01/03/16  5:57 PM  Result Value Ref Range Status   MRSA by PCR NEGATIVE NEGATIVE Final    Comment:        The GeneXpert MRSA Assay (FDA approved for NASAL specimens only), is one component of a comprehensive MRSA colonization surveillance program. It is not intended to diagnose MRSA infection nor to guide or monitor treatment for MRSA infections.      Studies: No results found.   Scheduled Meds: . aspirin EC  81 mg Oral Daily  . furosemide  80 mg Oral Daily  . insulin aspart  0-15 Units Subcutaneous TID WC  . insulin aspart  0-5 Units Subcutaneous QHS  . isosorbide mononitrate  60 mg Oral BID WC  . metoprolol tartrate  12.5 mg Oral BID  . pantoprazole  40 mg Oral Q0600  . rosuvastatin  40 mg Oral q1800   Continuous Infusions:  PRN Meds: acetaminophen **OR** acetaminophen, HYDROmorphone (DILAUDID) injection, nitroGLYCERIN, ondansetron **OR** ondansetron (ZOFRAN) IV, oxyCODONE  Time spent: 30 minutes  Author: Berle Mull, MD [facility] Pager: [phone] 01/09/2016 3:50 PM  If 7PM-7AM, please contact night-coverage at [email or web address], password [facility]

## 2016-01-09 NOTE — Progress Notes (Signed)
Progress Note   Subjective  Patient did well yesterday during the day but had another bloody BM this AM. CBC stable this AM. He denies pain. Surgical team has evaluated him.    Objective   Vital signs in last 24 hours: Temp:  [97.8 F (36.6 C)-98.6 F (37 C)] 97.8 F (36.6 C) (02/25 0651) Pulse Rate:  [78-85] 78 (02/25 1009) Resp:  [18] 18 (02/25 0651) BP: (112-152)/(57-62) 118/57 mmHg (02/25 1009) SpO2:  [95 %-100 %] 100 % (02/25 0651) Weight:  [183 lb 10.3 oz (83.3 kg)] 183 lb 10.3 oz (83.3 kg) (02/25 0651) Last BM Date: 01/09/16 General:    white male in NAD Heart:  Regular rate and rhythm; no murmurs Lungs: Respirations even and unlabored, lungs CTA bilaterally Abdomen:  Soft, nontender and nondistended. Normal bowel sounds. Extremities:  Without edema. Neurologic:  Alert and oriented,  grossly normal neurologically. Psych:  Cooperative. Normal mood and affect.  Intake/Output from previous day: 02/24 0701 - 02/25 0700 In: 340 [P.O.:340] Out: -  Intake/Output this shift:    Lab Results:  Recent Labs  01/07/16 0300 01/08/16 0514 01/09/16 0614  WBC 7.1 7.5 8.2  HGB 7.9* 8.8* 8.9*  HCT 24.8* 26.8* 28.1*  PLT 188 172 212   BMET  Recent Labs  01/07/16 0300 01/08/16 0514 01/09/16 0614  NA 142 138 142  K 4.2 3.7 3.7  CL 110 104 104  CO2 27 26 26   GLUCOSE 142* 151* 167*  BUN 17 19 23*  CREATININE 2.06* 1.91* 1.93*  CALCIUM 8.6* 8.8* 9.1   LFT  Recent Labs  01/09/16 0614  PROT 6.2*  ALBUMIN 3.2*  AST 23  ALT 20  ALKPHOS 60  BILITOT 0.7   PT/INR No results for input(s): LABPROT, INR in the last 72 hours.  Studies/Results: No results found.     Assessment / Plan:   82 y/o male with history of CAD, with recurrent diverticular bleeding complicated by NSTEMI during his last admission in January. He is high risk for contrast nephropathy if IR embolization is done given his CKD, while he is a higher than average risk for surgery given his  CAD. During last admission the bleeding diverticulum was able to be identified endoscopically in the left colon and treated with multiple hemostasis clips which stopped the bleeding, however unfortunately he had recurrence and was re-admitted for suspected diverticular bleeding this week which has persisted to bleed slowly and intermittently. His CBC has been stable the past 24 hours however.   Moving forward he is very high risk for recurrent bleeding given his history. I appreciate surgical evaluation. We have hoped to avoid surgery if possible but given he had a demand ischemia NSTEMI from anemia secondary to bleeding last month, and has had persistent bleeding since he has been admitted, it has not been possible to discharge him safely. He doesn't have significant bleeding at this time and doesn't urgently need any intervention, but he passed another bloody stool today and continues to have slow intermittent bleeding. I suspect left sided diverticular bleeding given this is where he bled from the last admission however tagged RBC scan was negative this admission.   For now will continue to monitor and I appreciate surgical team evaluation. If he has recurrence of significant bleeding I would recommend a tagged RBC scan to help localize the source. I will follow peripherally for now, but please call with questions or changes in his status.   [place] Cellar, MD  [facility] Pager [phone]    LOS: 6 days   Renelda Loma Armbruster  01/09/2016, 12:40 PM

## 2016-01-10 ENCOUNTER — Inpatient Hospital Stay (HOSPITAL_COMMUNITY): Payer: Medicare Other

## 2016-01-10 DIAGNOSIS — Z515 Encounter for palliative care: Secondary | ICD-10-CM

## 2016-01-10 DIAGNOSIS — Z7189 Other specified counseling: Secondary | ICD-10-CM

## 2016-01-10 LAB — CBC WITH DIFFERENTIAL/PLATELET
BASOS ABS: 0 10*3/uL (ref 0.0–0.1)
BASOS PCT: 0 %
Basophils Absolute: 0 10*3/uL (ref 0.0–0.1)
Basophils Relative: 0 %
Eosinophils Absolute: 0.3 10*3/uL (ref 0.0–0.7)
Eosinophils Absolute: 0.2 10*3/uL (ref 0.0–0.7)
Eosinophils Relative: 4 %
Eosinophils Relative: 3 %
HEMATOCRIT: 25.1 % — AB (ref 39.0–52.0)
HEMATOCRIT: 25.3 % — AB (ref 39.0–52.0)
HEMOGLOBIN: 8 g/dL — AB (ref 13.0–17.0)
HEMOGLOBIN: 8 g/dL — AB (ref 13.0–17.0)
LYMPHS ABS: 2 10*3/uL (ref 0.7–4.0)
LYMPHS PCT: 28 %
Lymphocytes Relative: 24 %
Lymphs Abs: 1.8 10*3/uL (ref 0.7–4.0)
MCH: 28.9 pg (ref 26.0–34.0)
MCH: 29.1 pg (ref 26.0–34.0)
MCHC: 31.6 g/dL (ref 30.0–36.0)
MCHC: 31.9 g/dL (ref 30.0–36.0)
MCV: 91.3 fL (ref 78.0–100.0)
MCV: 91.3 fL (ref 78.0–100.0)
MONOS PCT: 10 %
MONOS PCT: 9 %
Monocytes Absolute: 0.7 10*3/uL (ref 0.1–1.0)
Monocytes Absolute: 0.7 10*3/uL (ref 0.1–1.0)
NEUTROS ABS: 4.3 10*3/uL (ref 1.7–7.7)
NEUTROS ABS: 4.6 10*3/uL (ref 1.7–7.7)
NEUTROS PCT: 59 %
NEUTROS PCT: 63 %
Platelets: 191 10*3/uL (ref 150–400)
Platelets: 194 10*3/uL (ref 150–400)
RBC: 2.75 MIL/uL — AB (ref 4.22–5.81)
RBC: 2.77 MIL/uL — AB (ref 4.22–5.81)
RDW: 15 % (ref 11.5–15.5)
RDW: 14.9 % (ref 11.5–15.5)
WBC: 7.2 10*3/uL (ref 4.0–10.5)
WBC: 7.3 10*3/uL (ref 4.0–10.5)

## 2016-01-10 LAB — GLUCOSE, CAPILLARY
Glucose-Capillary: 172 mg/dL — ABNORMAL HIGH (ref 65–99)
Glucose-Capillary: 148 mg/dL — ABNORMAL HIGH (ref 65–99)
Glucose-Capillary: 146 mg/dL — ABNORMAL HIGH (ref 65–99)
Glucose-Capillary: 179 mg/dL — ABNORMAL HIGH (ref 65–99)

## 2016-01-10 LAB — PREPARE RBC (CROSSMATCH)

## 2016-01-10 MED ORDER — TECHNETIUM TC 99M-LABELED RED BLOOD CELLS IV KIT
25.1000 | PACK | Freq: Once | INTRAVENOUS | Status: AC | PRN
  Administered 2016-01-10: 25.1 via INTRAVENOUS

## 2016-01-10 MED ORDER — SODIUM CHLORIDE 0.9 % IV SOLN: Freq: Once | INTRAVENOUS | Status: DC

## 2016-01-10 NOTE — Progress Notes (Signed)
5 Days Post-Op  Subjective: Patient had four BM yesterday.  Gross blood in all four Hgb down slightly from 8.7 to 8.0 No abdominal pain Less energy  Objective: Vital signs in last 24 hours: Temp:  [98.2 F (36.8 C)-98.4 F (36.9 C)] 98.4 F (36.9 C) (02/26 0615) Pulse Rate:  [78-109] 92 (02/26 0615) Resp:  [18-20] 18 (02/26 0615) BP: (118-134)/(53-60) 124/53 mmHg (02/26 0615) SpO2:  [99 %-100 %] 100 % (02/26 0615) Weight:  [82.101 kg (181 lb)] 82.101 kg (181 lb) (02/26 0615) Last BM Date: 01/09/16  Intake/Output from previous day: 02/25 0701 - 02/26 0700 In: 1082 [P.O.:1082] Out: 650 [Urine:650] Intake/Output this shift: Total I/O In: -  Out: 150 [Urine:150]  General appearance: alert, cooperative and no distress GI: soft, non-tender; bowel sounds normal; no masses,  no organomegaly  Lab Results:   Recent Labs  01/10/16 0042 01/10/16 0634  WBC 7.2 7.3  HGB 8.0* 8.0*  HCT 25.3* 25.1*  PLT 191 194   BMET  Recent Labs  01/08/16 0514 01/09/16 0614  NA 138 142  K 3.7 3.7  CL 104 104  CO2 26 26  GLUCOSE 151* 167*  BUN 19 23*  CREATININE 1.91* 1.93*  CALCIUM 8.8* 9.1   PT/INR No results for input(s): LABPROT, INR in the last 72 hours. ABG No results for input(s): PHART, HCO3 in the last 72 hours.  Invalid input(s): PCO2, PO2  Studies/Results: No results found.  Anti-infectives: Anti-infectives    None      Assessment/Plan: s/p Procedure(s) with comments: COLONOSCOPY (Left) - no sedation to start, moderate if needed Patient needs Cardiology to clear him for potential surgery  Previous GI bleed was from left-sided diverticular disease - treated endoscopically.  Currently bleed not localized - tagged RBC study negative and EGD showed only mild gastritis.  Likely to be from left colon, but it would be preferable to have it localized prior to potential surgery.  Transfusion decision per primary team.  LOS: 7 days    Levis Nazir  K. 01/10/2016

## 2016-01-10 NOTE — Progress Notes (Addendum)
Patient called nurse to examine his stool. Upon assessment it was observed that the patient was passing what looks like blood clots along with loose blood. Patient states this is his third stool like this today. Hgb was checked and has gone down from 8.9 to 8.7 to 8.0. On-call MD has been notified and I will continue to monitor the patient.

## 2016-01-10 NOTE — Progress Notes (Addendum)
[facility] Progress Note  Patient: [name] [id number]   PCP: [name], MD DOB: May 13, 1932   DOA: 01/03/2016   DOS: 01/10/2016   Date of Service: the patient was seen and examined on 01/10/2016  Subjective: Patient continues to have small clots as well as bright red blood per rectum. Does not have any abdominal tenderness nausea or vomiting. Tolerating oral diet. Walking in the room.  Assessment and Plan: 1. GI bleed Presents with complaints of recurrent GI bleed. Patient has multiple admissions this months. Recent colonoscopy did show a left-sided diverticular bleed. This admission his nuclear scan is negative. Colonoscopy is also negative. GI recommended surgical evaluation. Since the patient does not have any evidence of localized bleeding surgery is recommending close observation at present. Patient's H&H dropped somewhat. Tomorrow nuclear medicine tagged RBC scan. If patient is found to be having an localized area of bleeding, cardiology will need to be consulted for preop evaluation Transfuse for hemoglobin less than 8 and gave him 1 unit of PRBC Addendum: Nuclear scan shows increased activity on the left colon, cardiology consulted for preop clearance.   Brendan Gadson 5:36 PM 01/10/2016    2. Chest pain with coronary artery disease as well as non-STEMI. Troponins are negative. Chest pain improved with blood transfusion. Patient will need to follow up with cardiology as an outpatient. Continuing metoprolol and statin. Low-dose aspirin given his coronary artery disease. Give one of PRBC  3. Type 2 diabetes mellitus. Hemoglobin A1c 6.9. Sliding scale insulin.  4. Chronic combined CHF. Appears to be euvolemic. Continuing Lasix at home doses.  5. Essential hypertension. Blood pressure is well controlled. Continuing home medications.  6. History of A. fib. At no anticoagulation due to recurrent GI bleed. Currently normal sinus rhythm  DVT Prophylaxis:  mechanical compression device Nutrition: regular diet Advance goals of care discussion: full code  Brief Summary of Hospitalization:  Procedures: Colonoscopy Consultants: Gastroenterology, general surgery Antibiotics: Anti-infectives    None      Family Communication: no family was present at bedside, at the time of interview.   Disposition:  Barriers to safe discharge: Stabilization of H&H and bleeding   Intake/Output Summary (Last 24 hours) at 01/10/16 1115 Last data filed at 01/10/16 0849  Gross per 24 hour  Intake   1082 ml  Output    800 ml  Net    282 ml   Filed Weights   01/08/16 0650 01/09/16 0651 01/10/16 0615  Weight: 83.689 kg (184 lb 8 oz) 83.3 kg (183 lb 10.3 oz) 82.101 kg (181 lb)    Objective: Physical Exam: Filed Vitals:   01/09/16 1009 01/09/16 1400 01/10/16 0013 01/10/16 0615  BP: 118/57 119/60 134/54 124/53  Pulse: 78 109 92 92  Temp:  98.2 F (36.8 C) 98.3 F (36.8 C) 98.4 F (36.9 C)  TempSrc:  Oral Oral Oral  Resp:  20 18 18   Height:      Weight:    82.101 kg (181 lb)  SpO2:  100% 99% 100%    General: Appear in mild distress, no Rash; Oral Mucosa moist Cardiovascular: S1 and S2 Present, no Murmur, no JVD Respiratory: Bilateral Air entry present and Clear to Auscultation, no Crackles, no wheezes Abdomen: Bowel Sound present, Soft and no tenderness Extremities: no Pedal edema, no calf tenderness  Data Reviewed: CBC:  Recent Labs Lab 01/08/16 0514 01/09/16 0614 01/09/16 1812 01/10/16 0042 01/10/16 0634  WBC 7.5 8.2 7.0 7.2 7.3  NEUTROABS  --   --   --  4.3 4.6  HGB 8.8* 8.9* 8.7* 8.0* 8.0*  HCT 26.8* 28.1* 27.5* 25.3* 25.1*  MCV 93.7 92.1 92.0 91.3 91.3  PLT 172 212 212 191 [id number]   Basic Metabolic Panel:  Recent Labs Lab 01/05/16 0415 01/06/16 0357 01/07/16 0300 01/08/16 0514 01/09/16 0614  NA 140 142 142 138 142  K 3.7 4.2 4.2 3.7 3.7  CL 108 111 110 104 104  CO2 24 25 27 26 26   GLUCOSE 122* 135* 142* 151* 167*  BUN  19 12 17 19  23*  CREATININE 1.89* 1.76* 2.06* 1.91* 1.93*  CALCIUM 8.7* 8.7* 8.6* 8.8* 9.1   Liver Function Tests:  Recent Labs Lab 01/07/16 0300 01/08/16 0514 01/09/16 0614  AST 21 20 23   ALT 19 18 20   ALKPHOS 60 56 60  BILITOT 0.3 0.7 0.7  PROT 5.6* 5.5* 6.2*  ALBUMIN 3.0* 2.9* 3.2*   No results for input(s): LIPASE, AMYLASE in the last 168 hours. No results for input(s): AMMONIA in the last 168 hours.  Cardiac Enzymes:  Recent Labs Lab 01/04/16 0759  TROPONINI 0.03    BNP (last 3 results) No results for input(s): BNP in the last 8760 hours.  CBG:  Recent Labs Lab 01/09/16 0756 01/09/16 1159 01/09/16 1700 01/09/16 2213 01/10/16 0811  GLUCAP 153* 143* 134* 187* 172*    Recent Results (from the past 240 hour(s))  MRSA PCR Screening     Status: None   Collection Time: 01/03/16  5:57 PM  Result Value Ref Range Status   MRSA by PCR NEGATIVE NEGATIVE Final    Comment:        The GeneXpert MRSA Assay (FDA approved for NASAL specimens only), is one component of a comprehensive MRSA colonization surveillance program. It is not intended to diagnose MRSA infection nor to guide or monitor treatment for MRSA infections.      Studies: No results found.   Scheduled Meds: . aspirin EC  81 mg Oral Daily  . furosemide  80 mg Oral Daily  . insulin aspart  0-15 Units Subcutaneous TID WC  . insulin aspart  0-5 Units Subcutaneous QHS  . isosorbide mononitrate  60 mg Oral BID WC  . metoprolol tartrate  12.5 mg Oral BID  . pantoprazole  40 mg Oral Q0600  . rosuvastatin  40 mg Oral q1800   Continuous Infusions:  PRN Meds: acetaminophen **OR** acetaminophen, HYDROmorphone (DILAUDID) injection, nitroGLYCERIN, ondansetron **OR** ondansetron (ZOFRAN) IV, oxyCODONE  Time spent: 30 minutes  Author: Berle Mull, MD [facility] Pager: [phone] 01/10/2016 11:15 AM  If 7PM-7AM, please contact night-coverage at [email or web address], password [email or web address]

## 2016-01-10 NOTE — Consult Note (Signed)
Consultation Note Date: 01/10/2016   Patient Name: [name]  DOB: 05-21-1932  MRN: [id number]  Age / Sex: 82 y.o., male  PCP: Tonia Ghent, MD Referring Physician: Lavina Hamman, MD  Reason for Consultation: Establishing goals of care and Psychosocial/spiritual support  Clinical Assessment/Narrative:   82 y.o. male with a history of CAD, Chronic Diastolic CHF, HTN, Stage III CKD, DM2 and previous Diverticular Bleeding who presents to the ED with complaints of passing dark stools for the past 3-4 days.He has weakness and fatigue   He was evaluated in the ED and was found to have a Hb level of 7.2, and on discharge last hospitalization in 11/2015 his hb was 10.5. GI is seeing and making recommendation, his hemoglobin dropped again in past 12 hrs and is awaiting GI recommendations.  Patient is hopeful for interventions to stabilize situation and return to baseline.  He verbalizes his frustration with ongoing issue of recurrent GI bleed.   This NP Wadie Lessen reviewed medical records, received report from team, assessed the patient and then meet at the patient's bedside to introduce concept of Palliative Care into a holistic treatment plan,  discuss diagnosis, GOC, and options.   A  discussion was had today regarding advanced directives. He has advanced directives documented and is encouraged to bring for scanning into EMR.  His daughter is his main support person along with his fiancee.  Values and goals of care important to patient were attempted to be elicited.   At this time he is open to all offered and available medical interventions to prolong life, he hopes to return home in his previous independent state of well-being.     Questions and concerns addressed.   Family encouraged to call with questions or concerns.  PMT will continue to support holistically.  Primary Decision Maker: self at this time  and his daughter Demetrius Charity is his HPOA HCPOA: yes  All information m ay be shared with his SO   SUMMARY OF RECOMMENDATIONS  - await input from GI  -move forward with recommended treatment   Code Status/Advance Care Planning:  Full code   He verbalizes that he does not want any life prolonging interventions if he is terminal or in an incurable state, quality is  priority      Code Status Orders        Start     Ordered   01/03/16 0413  Full code   Continuous     01/03/16 0412    Code Status History    Date Active Date Inactive Code Status Order ID Comments User Context   11/13/2015  4:46 PM 11/21/2015  3:50 PM Full Code [id number]  Kelvin Cellar, MD Inpatient   06/14/2014  5:44 PM 06/19/2014  7:02 PM Full Code [id number]  Belva Crome, MD Inpatient   06/13/2014 12:04 PM 06/14/2014  5:44 PM Full Code [id number]  Belva Crome, MD Inpatient   06/11/2014  4:26 PM 06/13/2014 12:04 PM Full Code [id number]  Belva Crome, MD Inpatient   06/08/2014 11:55 PM 06/11/2014  4:26 PM Full Code [id number]  Jacolyn Reedy, MD Inpatient         Psycho-social/Spiritual:  Support System: Strong Desire for further Chaplaincy support:no   Discharge Planning: Pending, hopefully home with home health and [facility] ( see note)   Chief Complaint/ Primary Diagnoses: Present on Admission:  . GI bleed . Acute blood loss anemia . CKD (chronic kidney disease) stage 3, GFR 30-59  ml/min . Diastolic CHF ([place]) . Atrial fibrillation ([place]) . Acute on chronic diastolic (congestive) heart failure ([place]) . Chronic combined systolic and diastolic CHF (congestive heart failure) ([place]) . Pulmonary hypertension ([place]) . CKD (chronic kidney disease), stage III  I have reviewed the medical record, interviewed the patient and family, and examined the patient. The following aspects are pertinent.  Past Medical History  Diagnosis Date  . Anemia     Secondary to acute blood loss  . Hypertension   . Hyperlipidemia   .  RBBB (right bundle branch block)   . Age-related macular degeneration, wet, both eyes ([place])   . Carotid artery disease ([place])     Doppler 09/18/2009 - 49% bilateral stenoses  . Personal history of colonic polyps   . Diverticulosis of colon with hemorrhage 2009    several unit diverticular bleed   . Erectile dysfunction     Mild  . Atrial flutter [facility]) 07/2010    September, 2011   Hospital with PNA and cath done.Marland KitchenMarland KitchenCoumadin.  Atrial flutter ablation planned, but  pt. then had atrial fibrillation,/outpatient conversion 09/08/10..NSR..plan to follow..Dr. Caryl Comes  . Gout   . Skin cancer     R lower leg, per derm 2012  . Atrial fibrillation ([place])     Consideration was given for atrial flutter ablation, but patient developed atrial fibrillation. Cardioversion was done. Dr. Caryl Comes decided to watch him clinically. November, 2011  . Carotid artery disease ([name])     49% bilateral, Doppler, November, 2010  . CAD (coronary artery disease)     Catheterization, September, 2011,  grafts patent from redo CABG,, medical therapy of coronary disease, consideration to proceeding with atrial flutter ablation  . Ejection fraction     EF 60%, echo, 2009  //   EF 65%, echo, September, 2011  . Mitral regurgitation     Mild, echo, September, 2011  . Shoulder pain     "positional; better now" (11/13/2015)  . Anginal pain ([place])   . PNA (pneumonia) 9/11    NSTEMI at [facility] with repeat cath, rec medical mgmt   . NSTEMI (non-ST elevated myocardial infarction) ([place]) 07/2010    at [facility] with repeat cath, rec medical mgmt   . OSA on CPAP   . Type II diabetes mellitus ([place])   . Diabetic peripheral neuropathy ([place])   . History of blood transfusion "several"    "related to diverticular bleeding"  . Arthritis     "mild in hands, knees, ankles" (11/13/2015)  . Chronic kidney disease (CKD), stage III (moderate)    Social History   Social History  . Marital Status: Widowed    Spouse Name: N/A  . Number of Children: 2  .  Years of Education: N/A   Occupational History  . 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 Smoking status: Former Smoker -- 1.00 packs/day for 8 years    Types: Cigarettes  . Smokeless tobacco: Never Used     Comment: "quit smoking cigarettes in 1958"  . Alcohol Use: No  . Drug Use: No  . Sexual Activity: Yes   Other Topics Concern  . None   Social History Narrative   From [place].  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  Widowed 12/2005.   Family History  Problem Relation Age of Onset  . Kidney disease Mother     Kidney failure  . Stroke Mother   .  Diabetes Mother   . Heart disease Father     MI  . Arthritis Sister   . Cancer Sister     Throat  . Heart disease Sister     MI  . Diabetes Sister   . Prostate cancer Neg Hx   . Colon cancer Neg Hx    Scheduled Meds: . sodium chloride   Intravenous Once  . aspirin EC  81 mg Oral Daily  . furosemide  80 mg Oral Daily  . insulin aspart  0-15 Units Subcutaneous TID WC  . insulin aspart  0-5 Units Subcutaneous QHS  . isosorbide mononitrate  60 mg Oral BID WC  . metoprolol tartrate  12.5 mg Oral BID  . pantoprazole  40 mg Oral Q0600  . rosuvastatin  40 mg Oral q1800   Continuous Infusions:  PRN Meds:.acetaminophen **OR** acetaminophen, HYDROmorphone (DILAUDID) injection, nitroGLYCERIN, ondansetron **OR** ondansetron (ZOFRAN) IV, oxyCODONE Medications Prior to Admission:  Prior to Admission medications   Medication Sig Start Date End Date Taking? Authorizing Provider  aspirin 81 MG tablet Take 81 mg by mouth daily.   Yes Historical Provider, MD  BD INSULIN SYRINGE ULTRAFINE 31G X 5/16" 0.3 ML MISC USE DAILY AS INSTRUCTED 12/02/15  Yes Tonia Ghent, MD  Cholecalciferol (VITAMIN D) 1000 UNITS capsule Take 1,000 Units by mouth daily.    Yes Historical Provider, MD  colchicine 0.6 MG tablet Take 1 tablet (0.6 mg total) by mouth daily  as needed (gout flare up). 11/30/15  Yes Tonia Ghent, MD  fish oil-omega-3 fatty acids 1000 MG capsule Take 1 g by mouth daily.    Yes Historical Provider, MD  furosemide (LASIX) 80 MG tablet Take 1 tablet (80 mg total) by mouth daily. 11/30/15  Yes Tonia Ghent, MD  insulin glargine (LANTUS) 100 UNIT/ML injection Inject 0.15 mLs (15 Units total) into the skin at bedtime. 11/30/15  Yes Tonia Ghent, MD  isosorbide mononitrate (IMDUR) 30 MG 24 hr tablet TAKE 2 TABLETS TWICE A DAY 08/24/15  Yes Carlena Bjornstad, MD  magnesium oxide (MAG-OX) 400 MG tablet Take 400 mg by mouth daily.    Yes Historical Provider, MD  Multiple Vitamin (MULTIVITAMIN WITH MINERALS) TABS Take 1 tablet by mouth daily.   Yes Historical Provider, MD  nitroGLYCERIN (NITROSTAT) 0.4 MG SL tablet Place 1 tablet (0.4 mg total) under the tongue every 5 (five) minutes as needed for chest pain. 09/04/15  Yes [name], MD  Potassium Gluconate (K-99) 595 MG CAPS Take 595 mg by mouth daily.    Yes Historical Provider, MD  ranolazine (RANEXA) 500 MG 12 hr tablet Take 1 tablet (500 mg total) by mouth 2 (two) times daily. 09/28/15  Yes [name], MD  rosuvastatin (CRESTOR) 20 MG tablet Take 1 tablet (20 mg total) by mouth daily. 09/28/15  Yes Carlena Bjornstad, MD  TOPROL XL 50 MG 24 hr tablet TAKE 1 TABLET DAILY WITH A MEAL OR IMMEDIATELY FOLLOWING A MEAL 10/06/15  Yes Carlena Bjornstad, MD  traMADol (ULTRAM) 50 MG tablet Take 1 tablet (50 mg total) by mouth every 6 (six) hours as needed. ARTHRITIS Patient taking differently: Take 100 mg by mouth 2 (two) times daily. Scheduled (for arthritis pain) 05/24/15  Yes Tonia Ghent, MD   No Known Allergies  Review of Systems  Constitutional: Positive for fatigue.  Gastrointestinal: Positive for blood in stool.  Skin: Positive for pallor.    Physical Exam  Constitutional: He appears well-developed  and well-nourished.  HENT:  Head: Normocephalic and atraumatic.   Cardiovascular: Normal rate and regular rhythm.   Respiratory: Effort normal and breath sounds normal.  GI: Soft. There is no tenderness.  Musculoskeletal:  weakness  Skin: Skin is warm and dry. There is pallor.    Vital Signs: BP 124/53 mmHg  Pulse 92  Temp(Src) 98.4 F (36.9 C) (Oral)  Resp 18  Ht 5\' 9"  (1.753 m)  Wt 82.101 kg (181 lb)  BMI 26.72 kg/m2  SpO2 100%  SpO2: SpO2: 100 % O2 Device:SpO2: 100 % O2 Flow Rate: .   IO: Intake/output summary:  Intake/Output Summary (Last 24 hours) at 01/10/16 1119 Last data filed at 01/10/16 0849  Gross per 24 hour  Intake   1082 ml  Output    800 ml  Net    282 ml    LBM: Last BM Date: 01/09/16 Baseline Weight: Weight: 84.4 kg (186 lb 1.1 oz) Most recent weight: Weight: 82.101 kg (181 lb)      Palliative Assessment/Data:  Flowsheet Rows        Most Recent Value   Intake Tab    Referral Department  Hospitalist   Unit at Time of Referral  Med/Surg Unit   Palliative Care Primary Diagnosis  Cardiac   Date Notified  01/08/16   Palliative Care Type  New Palliative care   Reason for referral  Clarify Goals of Care   Date of Admission  01/03/16   # of days IP prior to Palliative referral  5   Clinical Assessment    Psychosocial & Spiritual Assessment    Palliative Care Outcomes       Additional Data Reviewed:  CBC:    Component Value Date/Time   WBC 7.3 01/10/2016 0634   HGB 8.0* 01/10/2016 0634   HCT 25.1* 01/10/2016 0634   PLT 194 01/10/2016 0634   MCV 91.3 01/10/2016 0634   NEUTROABS 4.6 01/10/2016 0634   LYMPHSABS 1.8 01/10/2016 0634   MONOABS 0.7 01/10/2016 0634   EOSABS 0.2 01/10/2016 0634   BASOSABS 0.0 01/10/2016 0634   Comprehensive Metabolic Panel:    Component Value Date/Time   NA 142 01/09/2016 0614   K 3.7 01/09/2016 0614   CL 104 01/09/2016 0614   CO2 26 01/09/2016 0614   BUN 23* 01/09/2016 0614   BUN 27* 04/11/2013   CREATININE 1.93* 01/09/2016 0614   GLUCOSE 167* 01/09/2016 0614    CALCIUM 9.1 01/09/2016 0614   AST 23 01/09/2016 0614   ALT 20 01/09/2016 0614   ALKPHOS 60 01/09/2016 0614   BILITOT 0.7 01/09/2016 0614   PROT 6.2* 01/09/2016 0614   ALBUMIN 3.2* 01/09/2016 [id number]   Discussed with Dr Posey Pronto  Time In: 0730 Time Out: 0830 Time Total: 60 min Greater than 50%  of this time was spent counseling and coordinating care related to the above assessment and plan.  Signed by: Wadie Lessen, NP  Knox Royalty, NP  01/10/2016, 11:19 AM  Please contact Palliative Medicine Team phone at [phone] for questions and concerns.

## 2016-01-11 DIAGNOSIS — Z515 Encounter for palliative care: Secondary | ICD-10-CM | POA: Insufficient documentation

## 2016-01-11 DIAGNOSIS — Z7189 Other specified counseling: Secondary | ICD-10-CM | POA: Diagnosis present

## 2016-01-11 DIAGNOSIS — I2510 Atherosclerotic heart disease of native coronary artery without angina pectoris: Secondary | ICD-10-CM

## 2016-01-11 DIAGNOSIS — I25708 Atherosclerosis of coronary artery bypass graft(s), unspecified, with other forms of angina pectoris: Secondary | ICD-10-CM

## 2016-01-11 LAB — HEMOGLOBIN AND HEMATOCRIT, BLOOD
HCT: 30.3 % — ABNORMAL LOW (ref 39.0–52.0)
Hemoglobin: 10.1 g/dL — ABNORMAL LOW (ref 13.0–17.0)

## 2016-01-11 LAB — TYPE AND SCREEN
ABO/RH(D): O NEG
ANTIBODY SCREEN: POSITIVE
DAT, IGG: NEGATIVE
Donor AG Type: NEGATIVE
Unit division: 0

## 2016-01-11 LAB — GLUCOSE, CAPILLARY
GLUCOSE-CAPILLARY: 170 mg/dL — AB (ref 65–99)
Glucose-Capillary: 156 mg/dL — ABNORMAL HIGH (ref 65–99)
Glucose-Capillary: 158 mg/dL — ABNORMAL HIGH (ref 65–99)
Glucose-Capillary: 136 mg/dL — ABNORMAL HIGH (ref 65–99)

## 2016-01-11 NOTE — Progress Notes (Signed)
Pt refuses to wear CPAP tonight. Pt encouraged to call RT if pt changes mind. No distress noted. 

## 2016-01-11 NOTE — Progress Notes (Signed)
PROGRESS NOTE  [name] [id number] DOB: [birth year] DOA: 01/03/2016 PCP: [name], MD Outpatient Specialists:    LOS: 8 days   Brief Narrative: [name] is an 82 year old male who was admitted on 01/03/2016 with chest pain and BRBPR x 3-4 days. History of diverticular bleeding with surgical intervention.  Assessment & Plan: Principal Problem:   GI bleed Active Problems:   CKD (chronic kidney disease) stage 3, GFR 30-59 ml/min   Acute blood loss anemia   Acute on chronic diastolic (congestive) heart failure ([name])   Diabetes mellitus ([name])   Diastolic CHF ([name])   Atrial fibrillation ([name])   Chronic combined systolic and diastolic CHF (congestive heart failure) ([name])   Pulmonary hypertension ([name])   CKD (chronic kidney disease), stage III   History of GI diverticular bleed   Palliative care encounter   DNR (do not resuscitate) discussion   Acute GI bleed/Acute blood loss anemia - Recurrent diverticular bleed despite cessation of Brilinta 5-6 weeks ago. - Colonoscopy 01/05/2016 revealed diverticulosis: severe in the left colon, moderate in the transverse colon and mild in the right colon. No evidence of active bleeding or old blood. - Nuclear RBC scan 01/10/2016 revealed increased activity in the left mid-abd, likely the distal transverse colon and proximal descending colon consistent with acute hemorrhage. - monitor Hb - Transfuse for hemoglobin less than 8 - Cardiology cleared patient for surgery without further testing; patient is a high-risk surgical candidate - Suspected LGIB from diverticuli; Surgery discussed left hemicolectomy and sigmoidectomy with patient 01/11/2016. Surgery date pending. - received total 4U pRBC  CKD stage 3 - Appears euvolemic. Cr stable. Follow.   Chronic combined systolic and diastolic CHF/Acute on chronic diastolic (congestive) heart failure - Appears euvolemic - Continue lasix and metoprolol - Cardiology following  Chest pain  with coronary artery disease as well as NSTEMI. - Troponins are negative. Chest pain improved with blood transfusion 01/10/2016. - Continuing metoprolol and statin. Low-dose aspirin given his coronary artery disease. - Continue nitrostat prn  Diabetes mellitus - HgbA1c: 6.0 on arrival - Continue SSI  History of Atrial fibrillation  - No anticoagulation due to recurrent GIB - Current NSR  Essential hypertension - Blood pressure well controlled.  - Continue metoprolol  DVT prophylaxis: SCDs Code Status: Full Family Communication: None at bedside Disposition Plan: Home when ready Barriers for discharge: LGIB  Consultants:   Gastroenterology  Cardiology  Surgery  Palliative   Procedures:   Colonoscopy 01/05/2016  Antimicrobials:  None   Subjective: Patient was sitting up, eating breakfast on my visit. He appeared well, was pleasant and conversational. He said he feels good, all things considered. CP is 1/10 and slight DOE. No BM this morning.  Objective: Filed Vitals:   01/10/16 2315 01/10/16 2335 01/11/16 0218 01/11/16 0508  BP: 128/67 124/62 115/50 111/50  Pulse: 97 87 79 67  Temp: 98.3 F (36.8 C) 98.7 F (37.1 C) 98.1 F (36.7 C) 98.5 F (36.9 C)  TempSrc: Oral Oral Oral Oral  Resp: 18 16 16 20   Height:      Weight:    81.4 kg (179 lb 7.3 oz)  SpO2: 99% 100% 100% 100%    Intake/Output Summary (Last 24 hours) at 01/11/16 1208 Last data filed at 01/11/16 0900  Gross per 24 hour  Intake    756 ml  Output    450 ml  Net    306 ml   Filed Weights   01/09/16 [id number] 01/10/16 0615 01/11/16 [id number]  Weight: 83.3 kg (183 lb 10.3 oz) 82.101 kg (181 lb) 81.4 kg (179 lb 7.3 oz)    Examination: BP 111/50 mmHg  Pulse 67  Temp(Src) 98.5 F (36.9 C) (Oral)  Resp 20  Ht 5\' 9"  (1.753 m)  Wt 81.4 kg (179 lb 7.3 oz)  BMI 26.49 kg/m2  SpO2 100% General exam: NAD Respiratory system: Clear. No increased work of breathing. No wheezing, no crackles Cardiovascular  system: regular rate and rhythm, 2/6 systolic murmur, no gallops. No JVD. No peripheral edema.  Gastrointestinal system: Abdomen is nondistended, soft and nontender. Normal bowel sounds heard. Central nervous system: AxOx3. No focal deficits. Extremities: No clubbing/cyanosis Skin: No rashes.   Data Reviewed: I have personally reviewed following labs and imaging studies  CBC:  Recent Labs Lab 01/08/16 0514 01/09/16 0614 01/09/16 1812 01/10/16 0042 01/10/16 0634  WBC 7.5 8.2 7.0 7.2 7.3  NEUTROABS  --   --   --  4.3 4.6  HGB 8.8* 8.9* 8.7* 8.0* 8.0*  HCT 26.8* 28.1* 27.5* 25.3* 25.1*  MCV 93.7 92.1 92.0 91.3 91.3  PLT 172 212 212 191 [id number]   Basic Metabolic Panel:  Recent Labs Lab 01/05/16 0415 01/06/16 0357 01/07/16 0300 01/08/16 0514 01/09/16 0614  NA 140 142 142 138 142  K 3.7 4.2 4.2 3.7 3.7  CL 108 111 110 104 104  CO2 24 25 27 26 26   GLUCOSE 122* 135* 142* 151* 167*  BUN 19 12 17 19  23*  CREATININE 1.89* 1.76* 2.06* 1.91* 1.93*  CALCIUM 8.7* 8.7* 8.6* 8.8* 9.1   GFR: Estimated Creatinine Clearance: 30 mL/min (by C-G formula based on Cr of 1.93). Liver Function Tests:  Recent Labs Lab 01/07/16 0300 01/08/16 0514 01/09/16 0614  AST 21 20 23   ALT 19 18 20   ALKPHOS 60 56 60  BILITOT 0.3 0.7 0.7  PROT 5.6* 5.5* 6.2*  ALBUMIN 3.0* 2.9* 3.2*   No results for input(s): LIPASE, AMYLASE in the last 168 hours. No results for input(s): AMMONIA in the last 168 hours. Coagulation Profile: No results for input(s): INR, PROTIME in the last 168 hours. Cardiac Enzymes: No results for input(s): CKTOTAL, CKMB, CKMBINDEX, TROPONINI in the last 168 hours. BNP (last 3 results) No results for input(s): PROBNP in the last 8760 hours. HbA1C: No results for input(s): HGBA1C in the last 72 hours. CBG:  Recent Labs Lab 01/10/16 0811 01/10/16 1206 01/10/16 1701 01/10/16 2120 01/11/16 0754  GLUCAP 172* 148* 146* 179* 156*   Lipid Profile: No results for input(s):  CHOL, HDL, LDLCALC, TRIG, CHOLHDL, LDLDIRECT in the last 72 hours. Thyroid Function Tests: No results for input(s): TSH, T4TOTAL, FREET4, T3FREE, THYROIDAB in the last 72 hours. Anemia Panel: No results for input(s): VITAMINB12, FOLATE, FERRITIN, TIBC, IRON, RETICCTPCT in the last 72 hours. Urine analysis:    Component Value Date/Time   COLORURINE YELLOW 11/14/2015 [place] 11/14/2015 0241   LABSPEC 1.018 11/14/2015 0241   PHURINE 5.0 11/14/2015 0241   GLUCOSEU NEGATIVE 11/14/2015 0241   HGBUR NEGATIVE 11/14/2015 0241   HGBUR negative 09/12/2007 1028   BILIRUBINUR NEGATIVE 11/14/2015 0241   KETONESUR NEGATIVE 11/14/2015 0241   PROTEINUR NEGATIVE 11/14/2015 0241   UROBILINOGEN 0.2 06/14/2014 2210   NITRITE NEGATIVE 11/14/2015 0241   LEUKOCYTESUR NEGATIVE 11/14/2015 0241   Sepsis Labs: Invalid input(s): PROCALCITONIN, LACTICIDVEN  Recent Results (from the past 240 hour(s))  MRSA PCR Screening     Status: None   Collection Time: 01/03/16  5:57 PM  Result Value Ref Range Status   MRSA by PCR NEGATIVE NEGATIVE Final    Comment:        The GeneXpert MRSA Assay (FDA approved for NASAL specimens only), is one component of a comprehensive MRSA colonization surveillance program. It is not intended to diagnose MRSA infection nor to guide or monitor treatment for MRSA infections.       Radiology Studies: Nm Gi Blood Loss  01/10/2016  CLINICAL DATA:  GI hemorrhage and rectal bleeding EXAM: NUCLEAR MEDICINE GASTROINTESTINAL BLEEDING SCAN TECHNIQUE: Sequential abdominal images were obtained following intravenous administration of Tc-55m labeled red blood cells. RADIOPHARMACEUTICALS:  25.1 mCi Tc-51m in-vitro labeled red cells. COMPARISON:  01/03/2016 FINDINGS: There are now areas of increased activity identified in the left mid abdomen which demonstrate mobility and likely lie within the distal transverse colon and proximal descending colon consistent with acute  hemorrhage. No other focal area of hemorrhage is seen. IMPRESSION: Areas of increased activity in the far lateral left abdomen likely related to the distal transverse and proximal descending colon. Electronically Signed   By: Inez Catalina M.D.   On: 01/10/2016 16:30     Scheduled Meds: . sodium chloride   Intravenous Once  . aspirin EC  81 mg Oral Daily  . furosemide  80 mg Oral Daily  . insulin aspart  0-15 Units Subcutaneous TID WC  . insulin aspart  0-5 Units Subcutaneous QHS  . isosorbide mononitrate  60 mg Oral BID WC  . metoprolol tartrate  12.5 mg Oral BID  . pantoprazole  40 mg Oral Q0600  . rosuvastatin  40 mg Oral q1800   Continuous Infusions: None.   Marzetta Board, MD, PhD [facility] Pager [phone]  If 7PM-7AM, please contact night-coverage [email or web address] Password [facility] 01/11/2016, 12:08 PM

## 2016-01-11 NOTE — Care Management Note (Signed)
Case Management Note  Patient Details  Name: SAMRUDH BORGO MRN: [id number] Date of Birth: 08-21-1932  Subjective/Objective:                 Admitted with GI bleed.History of diverticular bleeding, CAD, Chronic Diastolic CHF, HTN, Stage III CKD, DM2.  Action/Plan: Pending surgery: L hemicolectomy/sigmoidectomy. CM to f/u with disposition needs  Expected Discharge Date:  01/05/16               Expected Discharge Plan:  Home/Self Care  In-House Referral:  Clinical Social Work  Discharge planning Services  CM Consult  Post Acute Care Choice:    Choice offered to:     DME Arranged:    DME Agency:     HH Arranged:    HH Agency:     Status of Service:  In process, will continue to follow  Medicare Important Message Given:  Yes Date Medicare IM Given:    Medicare IM give by:    Date Additional Medicare IM Given:    Additional Medicare Important Message give by:     If discussed at [place] of Stay Meetings, dates discussed:    Additional Comments: Virgilio Frees (Daughter)  [phone]  Whitman [place], [place] [phone] 01/11/2016, 7:02 PM

## 2016-01-11 NOTE — Consult Note (Signed)
CARDIOLOGY CONSULT NOTE   Patient ID: [name] MRN: [id number], DOB/AGE: May 19, 1932   Admit date: 01/03/2016 Date of Consult: 01/11/2016   Primary Physician: [name], MD Primary Cardiologist: Dr. Gwenlyn Found (formerly Dr. [name])  Pt. Profile  [name] is a pleasant 82 year old Caucasian male with past medical history of severe diverticulosis, multiple GI bleed since 1981, carotid artery disease s/p R CEA by [name] '94, HTN, HLD, DM, CKD stage IV, CAD s/p CABG 1996 with redo 2006 by [name], and h/o PAF not on coumadin due to recurrent GI bleed presented with recurrent GI bleed after recent NSTEMI on medical management. Cardiology consulted for preop clearance.   Problem List  Past Medical History  Diagnosis Date  . Anemia     Secondary to acute blood loss  . Hypertension   . Hyperlipidemia   . RBBB (right bundle branch block)   . Age-related macular degeneration, wet, both eyes ([place])   . Carotid artery disease ([place])     Doppler 09/18/2009 - 49% bilateral stenoses  . Personal history of colonic polyps   . Diverticulosis of colon with hemorrhage 2009    several unit diverticular bleed   . Erectile dysfunction     Mild  . Atrial flutter [facility]) 07/2010    September, 2011   Hospital with PNA and cath done.Marland KitchenMarland KitchenCoumadin.  Atrial flutter ablation planned, but  pt. then had atrial fibrillation,/outpatient conversion 09/08/10..NSR..plan to follow..Dr. Caryl Comes  . Gout   . Skin cancer     R lower leg, per derm 2012  . Atrial fibrillation ([place])     Consideration was given for atrial flutter ablation, but patient developed atrial fibrillation. Cardioversion was done. Dr. Caryl Comes decided to watch him clinically. November, 2011  . Carotid artery disease ([name])     49% bilateral, Doppler, November, 2010  . CAD (coronary artery disease)     Catheterization, September, 2011,  grafts patent from redo CABG,, medical therapy of coronary disease, consideration to proceeding with atrial  flutter ablation  . Ejection fraction     EF 60%, echo, 2009  //   EF 65%, echo, September, 2011  . Mitral regurgitation     Mild, echo, September, 2011  . Shoulder pain     "positional; better now" (11/13/2015)  . Anginal pain ([place])   . PNA (pneumonia) 9/11    NSTEMI at [facility] with repeat cath, rec medical mgmt   . NSTEMI (non-ST elevated myocardial infarction) ([place]) 07/2010    at [facility] with repeat cath, rec medical mgmt   . OSA on CPAP   . Type II diabetes mellitus ([place])   . Diabetic peripheral neuropathy ([place])   . History of blood transfusion "several"    "related to diverticular bleeding"  . Arthritis     "mild in hands, knees, ankles" (11/13/2015)  . Chronic kidney disease (CKD), stage III (moderate)     Past Surgical History  Procedure Laterality Date  . Cardiac catheterization  2006    Nuclear..slight lateral ischemia..medical therapy  . Cardiac catheterization  08/04/2010    grafts patent from redo CABG...medical Rx and ablate Atrial flutter (LV not injected)   . Doppler echocardiography  08/2008    EF 60%  . Doppler echocardiography  08/02/2010    65-70%  . Doppler echocardiography  07/2010    MR mild  . Coronary artery bypass graft  1995; 2006    "X 3; X3"  . Colonoscopy w/ polypectomy    . Cataract extraction w/  intraocular lens  implant, bilateral Bilateral   . Vasectomy    . Tonsillectomy    . Carotid endarterectomy Right 1994  . Coronary artery bypass graft  1995, 2006  . Esophagogastroduodenoscopy N/A 06/19/2014    Procedure: ESOPHAGOGASTRODUODENOSCOPY (EGD);  Surgeon: Jerene Bears, MD;  Location: [facility] ENDOSCOPY;  Service: Endoscopy;  Laterality: N/A;  . Left heart catheterization with coronary/graft angiogram N/A 06/11/2014    Procedure: LEFT HEART CATHETERIZATION WITH Beatrix Fetters;  Surgeon: Sinclair Grooms, MD;  Location: [facility] CATH LAB;  Service: Cardiovascular;  Laterality: N/A;  . Percutaneous coronary stent intervention (pci-s) N/A 06/13/2014     Procedure: PERCUTANEOUS CORONARY STENT INTERVENTION (PCI-S);  Surgeon: Sinclair Grooms, MD;  Location: [facility] CATH LAB;  Service: Cardiovascular;  Laterality: N/A;  . Left heart cath N/A 06/14/2014    Procedure: LEFT HEART CATH;  Surgeon: Sinclair Grooms, MD;  Location: [facility] CATH LAB;  Service: Cardiovascular;  Laterality: N/A;  . Laparoscopic cholecystectomy  2008  . Skin cancer excision Left 10/2015    calf  . Skin cancer excision Right 2014?    chest  . Cardioversion  ~ 2010  . Colonoscopy N/A 11/14/2015    Procedure: COLONOSCOPY;  Surgeon: Manus Gunning, MD;  Location: [place];  Service: Gastroenterology;  Laterality: N/A;  . Esophagogastroduodenoscopy N/A 11/14/2015    Procedure: ESOPHAGOGASTRODUODENOSCOPY (EGD);  Surgeon: Manus Gunning, MD;  Location: [place];  Service: Gastroenterology;  Laterality: N/A;  . Flexible sigmoidoscopy N/A 11/16/2015    Procedure: FLEXIBLE SIGMOIDOSCOPY;  Surgeon: Manus Gunning, MD;  Location: [place];  Service: Gastroenterology;  Laterality: N/A;  . Colonoscopy Left 01/05/2016    Procedure: COLONOSCOPY;  Surgeon: Manus Gunning, MD;  Location: [place];  Service: Gastroenterology;  Laterality: Left;  no sedation to start, moderate if needed     Allergies  No Known Allergies  HPI   [name] is a pleasant 82 year old Caucasian male with past medical history of severe diverticulosis, multiple GI bleed since 1981, carotid artery disease s/p R CEA by [name] '94, HTN, HLD, DM, CKD stage IV, CAD s/p CABG 1996 with redo 2006 by [name], and h/o PAF not on coumadin due to recurrent GI bleed. He was hospitalized in late July 2015 for unstable angina. Cardiac catheterization revealed high-grade lesion in SVG to OM which was stented in a staged fashion on 7/31 with Promus Premier 4.0x8mm DES. Postprocedure, he had prolonged chest pain for 12 hours. P2Y12 study was suboptimal at 266. He was brought back to the  cath lab on 06/14/2014 which revealed a segmental 50-60% stenosis proximal to the previously placed stent which was treated with 4.0 x 60 mm Promus Premier DES. He was felt to be a Plavix nonresponder, Plavix was changed to Brilinta. According to the patient, since 1981, he has had at least 5 admissions for GI bleed. He states he usually have GI bleed at least twice a year, and most of the time, he does not need to come to the hospital and would self resolve within 2 days. He was previously taken off of Coumadin due to concern for GI bleed, however continued to have episodic blood in the stool despite this.   He was recently admitted from 11/13/2015 until 11/21/2015 with recurrent GI bleed requiring transfusion. Hospital course was complicated by recurrence of NSTEMI and pneumonia. Due to stage IV CKD, and active bleeding, we were unable to cath him. However EKG at the time shows new  ST depression in the lateral leads. Elevated troponin. And also echocardiogram showed new wall motion abnormality in the anterior myocardium. Due to active bleeding, his Brilinta was stopped, he was continued on aspirin. Ranexa was added for antianginal effect, amlodipine was discontinued. He has GI workup with clipping. Since discharge, he continued to have mild chest discomfort and shortness of breath with minimal exertion.  Unfortunately despite previous GI clipping, he started note dark stools on Tuesday 12/29/2015, after 4 days of GI bleed, he eventually decided to seek medical attention at [facility] on 01/03/2016. He has been evaluated by both gastroenterology and surgery team. He underwent diagnostic colonoscopy on 01/05/2016 which showed pancolonic diverticulosis worse on the left colon. He was felt to be bleeding from the left colon given his history of recurrent GI bleed as well and previous GI workup. RBC tracer image showed area of increased activity in the left lateral abdomen likely represent distal transverse and  proximal descending colon acute hemorrhage. Cardiology has been consulted for preoperative clearance. Of note, current EKG still shows ST depression in the lateral leads.   Inpatient Medications  . sodium chloride   Intravenous Once  . aspirin EC  81 mg Oral Daily  . furosemide  80 mg Oral Daily  . insulin aspart  0-15 Units Subcutaneous TID WC  . insulin aspart  0-5 Units Subcutaneous QHS  . isosorbide mononitrate  60 mg Oral BID WC  . metoprolol tartrate  12.5 mg Oral BID  . pantoprazole  40 mg Oral Q0600  . rosuvastatin  40 mg Oral q1800    Family History Family History  Problem Relation Age of Onset  . Kidney disease Mother     Kidney failure  . Stroke Mother   . Diabetes Mother   . Heart disease Father     MI  . Arthritis Sister   . Cancer Sister     Throat  . Heart disease Sister     MI  . Diabetes Sister   . Prostate cancer Neg Hx   . Colon cancer Neg Hx      Social History Social History   Social History  . Marital Status: Widowed    Spouse Name: N/A  . Number of Children: 2  . Years of Education: N/A   Occupational History  . 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 Smoking status: Former Smoker -- 1.00 packs/day for 8 years    Types: Cigarettes  . Smokeless tobacco: Never Used     Comment: "quit smoking cigarettes in 1958"  . Alcohol Use: No  . Drug Use: No  . Sexual Activity: Yes   Other Topics Concern  . Not on file   Social History Narrative   From [place].  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  Widowed 12/2005.     Review of Systems  General:  No chills, fever, night sweats or weight changes.  Cardiovascular:  No edema, orthopnea, palpitations, paroxysmal nocturnal dyspnea. 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 All other systems reviewed and are otherwise negative except as noted above.  Physical Exam  Blood pressure 111/50, pulse 67,  temperature 98.5 F (36.9 C), temperature source Oral, resp. rate 20, height 5\' 9"  (1.753 m), weight 179 lb 7.3 oz (81.4 kg), SpO2 100 %.  General: Pleasant, NAD Psych: Normal affect. Neuro: Alert and oriented X 3. Moves all extremities spontaneously. HEENT: Normal  Neck: Supple without bruits or JVD. Lungs:  Resp regular and unlabored, CTA. Heart: RRR no s3, s4. 1/6 systolic murmurs at RUSB Abdomen: Soft, non-tender, non-distended, BS + x 4.  Extremities: No clubbing, cyanosis or edema. DP/PT/Radials 2+ and equal bilaterally.  Labs  No results for input(s): CKTOTAL, CKMB, TROPONINI in the last 72 hours. Lab Results  Component Value Date   WBC 7.3 01/10/2016   HGB 8.0* 01/10/2016   HCT 25.1* 01/10/2016   MCV 91.3 01/10/2016   PLT 194 01/10/2016    Recent Labs Lab 01/09/16 0614  NA 142  K 3.7  CL 104  CO2 26  BUN 23*  CREATININE 1.93*  CALCIUM 9.1  PROT 6.2*  BILITOT 0.7  ALKPHOS 60  ALT 20  AST 23  GLUCOSE 167*   Lab Results  Component Value Date   CHOL 128 12/19/2015   HDL 39* 12/19/2015   LDLCALC 70 12/19/2015   TRIG 95 12/19/2015   Lab Results  Component Value Date   DDIMER  08/01/2010    0.29        AT THE INHOUSE ESTABLISHED CUTOFF VALUE OF 0.48 ug/mL FEU, THIS ASSAY HAS BEEN DOCUMENTED IN THE LITERATURE TO HAVE A SENSITIVITY AND NEGATIVE PREDICTIVE VALUE OF AT LEAST 98 TO 99%.  THE TEST RESULT SHOULD BE CORRELATED WITH AN ASSESSMENT OF THE CLINICAL PROBABILITY OF DVT / VTE.    Radiology/Studies  Dg Chest 2 View  01/03/2016  CLINICAL DATA:  82 year old male with chest pain EXAM: CHEST  2 VIEW COMPARISON:  Radiograph dated 12/18/2015 FINDINGS: Two views of  the chest demonstrate stable minimal left lung base linear atelectasis. There is no focal consolidation, pleural effusion, or pneumothorax. Stable cardiac silhouette. Median sternotomy wires and CABG vascular clips. No acute osseous pathology. IMPRESSION: No active cardiopulmonary disease. Electronically Signed   By: Anner Crete M.D.   On: 01/03/2016 03:02   Dg Chest 2 View  12/18/2015  CLINICAL DATA:  Pneumonia. EXAM: CHEST  2 VIEW COMPARISON:  November 17, 2015. FINDINGS: Stable cardiomediastinal silhouette. Status post coronary artery bypass graft. No pneumothorax or effusion is noted. Multilevel degenerative disc disease is noted in mid thoracic spine. Atherosclerosis thoracic aorta is noted. Right lung is clear. Mild left basilar subsegmental atelectasis is noted. IMPRESSION: Mild left basilar subsegmental atelectasis. Electronically Signed   By: Marijo Conception, M.D.   On: 12/18/2015 13:12   Nm Gi Blood Loss  01/10/2016  CLINICAL DATA:  GI hemorrhage and rectal bleeding EXAM: NUCLEAR MEDICINE GASTROINTESTINAL BLEEDING SCAN TECHNIQUE: Sequential abdominal images were obtained following intravenous administration of Tc-35m labeled red blood cells. RADIOPHARMACEUTICALS:  25.1 mCi Tc-64m in-vitro labeled red cells. COMPARISON:  01/03/2016 FINDINGS: There are now areas of increased activity identified in the left mid abdomen which demonstrate mobility and likely lie within the distal transverse colon and proximal descending colon consistent with acute hemorrhage. No other focal area of hemorrhage is seen. IMPRESSION: Areas of increased activity in the far lateral left abdomen likely related to the distal transverse and proximal descending colon. Electronically Signed   By: Inez Catalina M.D.   On: 01/10/2016 16:30   Nm Gi Blood Loss  01/03/2016  CLINICAL DATA:  History of GI diverticular bleed.  GI bleeding. Dark blood in the stools noted 4 days ago. EXAM: NUCLEAR MEDICINE GASTROINTESTINAL BLEEDING SCAN  TECHNIQUE: Sequential abdominal images were obtained following intravenous administration of Tc-39m labeled red blood cells. RADIOPHARMACEUTICALS:  25.0 mCi Tc-33m in-vitro labeled red cells. COMPARISON:  09/13/2008 FINDINGS: Early level activity is identified. Delayed images are performed, demonstrating no site of active bleeding. IMPRESSION: A site of active bleeding is not identified. Electronically Signed   By: Nolon Nations M.D.   On: 01/03/2016 16:27    ECG  Normal sinus rhythm with right bundle branch block and ST depression in the lateral leads.   ASSESSMENT AND PLAN  1. Preop clearance for recurrent GI bleeding and severe pancolonic diverticulosis  - Patient still has exertional angina, although current anemia can contribute to angina, he likely also has significant underlying coronary artery disease given persistent ST depression in the lateral leads. He also has anterior wall motion abnormality seen on recent echocardiogram despite having a stable EF of 50-55%.  - Will discuss with M.D., patient likely is a high risk for any surgical procedure. Unable to take him to the cath lab and put in stent at this time due to active GI bleed and stage IV CKD.  2. Acute GI bleed with multiple GI bleed since 1981, likely related to severe diverticulosis  - colonoscopy on 2/21 worse diverticulosis on L compare to R, no active bleeding  - RBC tagged scan on 2/26 increased activity identified on L abdomen likely represent acute hemorrhage  3. CAD s/p CABG 1996 with redo 2006 by [name]  4. CKD stage IV  5. carotid artery disease s/p R CEA by [name] '94  6. HTN 7. HLD 8. DM 9. h/o PAF not on coumadin due to recurrent GI bleed   Signed, Almyra Deforest, PA-C 01/11/2016, 7:54 AM   The patient was seen and examined. [name] is an 82 year old Caucasian male who has a significant cardiovascular disease history including initial CABG surgery in 1996, redo CABG surgery by Dr.  Roxy Horseman 2006, peripheral vascular disease with right carotid endarterectomy in 1994 as well as a history of hypertension, diabetes mellitus, progressive renal insufficiency with stage IV chronic kidney disease and a history of PAF not on Coumadin anticoagulation due to recurrent GI bleeds.  In July 2015 he underwent stenting of the vein graft to the obtuse marginal vessel and postprocedure develop recurrent chest pain and required additional stenting.  At that time he was found to be a Plavix nonresponder and his antiplatelet therapy was changed to [place].  Unfortunately, due to recurrent GI bleeds brilinta was discontinued in in late December 2016 when he represented with current GI bleed and ruled in for non-ST segment MI and also had pneumonia.  Due to his renal insufficiency he did not undergo repeat cardiac catheterization.  Ranexa was added to his medical therapy.  He underwent GI clipping endoscopically.  Patient states that he had been doing fairly well with his anti-anginal medical regimen including beta blocker therapy up until then.  In February 2017 he again noted recurrent dark stools and was found to have pancolonic diverticulosis, worse on the left colon was felt to be bleeding from the left colon.  RBC tracer image revealed increased activity in the left lateral abdomen.  He is now in need for surgery with extended open left hemicolectomy and sigmoidectomy.  The patient presently denies any angina at rest and typically had experienced exertional chest tightness only with exertion.  His last  echo Doppler study in January 2017 showed an EF of 50-55% with mild anterolateral, anterior and distal lateral and anterior hypokinesis.  There was grade 2 diastolic dysfunction.  There was mild to moderate mitral regurgitation and moderate tricuspid regurgitation.  There was moderate pulmonary artery hypertension with estimated PA pressure 51 mm.  Since his recent non-ST segment elevation MI, he has been taking  ~ 1 sublingual nitroglycerin per week for exertional anginal symptoms.  His ECG reveals sinus rhythm at 67 with right bundle branch block, prolonged QTc interval, and lateral T-wave changes.  Presently, I recommend slight additional titration of the patient's beta blocker regimen.  Although he is at increased preoperative risk in light  due to his CAD and recent non-ST segment elevation MI, because of recurrent GI bleeding,surgery needs to be performed, but this will need to be done with close hemodynamic monitoring, postoperative telemetry and ECG recordings.  I would not perform this surgery as an outpatient.  Follow-up ECG tomorrow regarding ST changes and QTc interval.  Consider reintroducing low-dose amlodipine at 2.5 mg as blood pressure allows and low dose ranolazine.  We will follow patient.  Troy Sine, M.D., [facility] 01/11/2016  10:34 AM

## 2016-01-11 NOTE — Progress Notes (Signed)
6 Days Post-Op  Subjective: Pt with blood clots with BMs overnight HCt stable over last 24hrs  Objective: Vital signs in last 24 hours: Temp:  [98.1 F (36.7 C)-98.7 F (37.1 C)] 98.5 F (36.9 C) (02/27 0508) Pulse Rate:  [67-97] 67 (02/27 0508) Resp:  [16-20] 20 (02/27 0508) BP: (111-128)/(50-70) 111/50 mmHg (02/27 0508) SpO2:  [99 %-100 %] 100 % (02/27 0508) Weight:  [81.4 kg (179 lb 7.3 oz)] 81.4 kg (179 lb 7.3 oz) (02/27 0508) Last BM Date: 01/10/16  Intake/Output from previous day: 02/26 0701 - 02/27 0700 In: 518 [P.O.:120; Blood:398] Out: 600 [Urine:600] Intake/Output this shift:    General appearance: alert and cooperative GI: soft, non-tender; bowel sounds normal; no masses,  no organomegaly  Lab Results:   Recent Labs  01/10/16 0042 01/10/16 0634  WBC 7.2 7.3  HGB 8.0* 8.0*  HCT 25.3* 25.1*  PLT 191 194   BMET  Recent Labs  01/09/16 0614  NA 142  K 3.7  CL 104  CO2 26  GLUCOSE 167*  BUN 23*  CREATININE 1.93*  CALCIUM 9.1   PT/INR No results for input(s): LABPROT, INR in the last 72 hours. ABG No results for input(s): PHART, HCO3 in the last 72 hours.  Invalid input(s): PCO2, PO2  Studies/Results: Nm Gi Blood Loss  01/10/2016  CLINICAL DATA:  GI hemorrhage and rectal bleeding EXAM: NUCLEAR MEDICINE GASTROINTESTINAL BLEEDING SCAN TECHNIQUE: Sequential abdominal images were obtained following intravenous administration of Tc-58m labeled red blood cells. RADIOPHARMACEUTICALS:  25.1 mCi Tc-77m in-vitro labeled red cells. COMPARISON:  01/03/2016 FINDINGS: There are now areas of increased activity identified in the left mid abdomen which demonstrate mobility and likely lie within the distal transverse colon and proximal descending colon consistent with acute hemorrhage. No other focal area of hemorrhage is seen. IMPRESSION: Areas of increased activity in the far lateral left abdomen likely related to the distal transverse and proximal descending colon.  Electronically Signed   By: Inez Catalina M.D.   On: 01/10/2016 16:30    Anti-infectives: Anti-infectives    None      Assessment/Plan: LGIB likely from diverticuli -Per RBC scan pt will ikely need extended open L hemicolectomy and sigmoidectomy.  I d/w the pt that the RBC scan can be a false positive in regard to location.  He will require colonic prep prior to surgery. This would not be a low risk surgery for him.  If pt could be stabilized from LGIB its possible this could be done as an outpt but low likely he would be stable with anti plt tx. -Pt will require Cardiac clearance currently pending - Con't clears for not and will await cardiac eval  LOS: 8 days    Rosario Jacks., [facility] 01/11/2016

## 2016-01-11 NOTE — Care Management Important Message (Signed)
Important Message  Patient Details  Name: NIKIL MAYON MRN: [id number] Date of Birth: 14-Mar-1932   Medicare Important Message Given:  Yes    Lowen Barringer Abena 01/11/2016, 11:00 AM

## 2016-01-12 DIAGNOSIS — K625 Hemorrhage of anus and rectum: Secondary | ICD-10-CM | POA: Insufficient documentation

## 2016-01-12 DIAGNOSIS — I5033 Acute on chronic diastolic (congestive) heart failure: Secondary | ICD-10-CM

## 2016-01-12 LAB — GLUCOSE, CAPILLARY
GLUCOSE-CAPILLARY: 189 mg/dL — AB (ref 65–99)
GLUCOSE-CAPILLARY: 158 mg/dL — AB (ref 65–99)
GLUCOSE-CAPILLARY: 150 mg/dL — AB (ref 65–99)
Glucose-Capillary: 164 mg/dL — ABNORMAL HIGH (ref 65–99)

## 2016-01-12 MED ORDER — MAGNESIUM OXIDE 400 (241.3 MG) MG PO TABS
400.0000 mg | ORAL_TABLET | Freq: Two times a day (BID) | ORAL | Status: DC
  Administered 2016-01-12 – 2016-01-13 (×3): 400 mg via ORAL
  Filled 2016-01-12 (×3): qty 1

## 2016-01-12 MED ORDER — DEXTROSE 5 % IV SOLN
2.0000 g | INTRAVENOUS | Status: AC
  Administered 2016-01-13: 2 g via INTRAVENOUS

## 2016-01-12 MED ORDER — POLYETHYLENE GLYCOL 3350 17 GM/SCOOP PO POWD
1.0000 | Freq: Once | ORAL | Status: AC
  Administered 2016-01-12: 255 g via ORAL
  Filled 2016-01-12 (×2): qty 255

## 2016-01-12 MED ORDER — CHLORHEXIDINE GLUCONATE CLOTH 2 % EX PADS
6.0000 | MEDICATED_PAD | Freq: Once | CUTANEOUS | Status: AC
  Administered 2016-01-13: 6 via TOPICAL

## 2016-01-12 MED ORDER — CHLORHEXIDINE GLUCONATE CLOTH 2 % EX PADS
6.0000 | MEDICATED_PAD | Freq: Once | CUTANEOUS | Status: AC
  Administered 2016-01-12: 6 via TOPICAL

## 2016-01-12 MED ORDER — SODIUM CHLORIDE 0.9 % IV SOLN
INTRAVENOUS | Status: DC
  Administered 2016-01-12: 23:00:00 via INTRAVENOUS

## 2016-01-12 MED ORDER — ERYTHROMYCIN BASE 250 MG PO TABS
1000.0000 mg | ORAL_TABLET | ORAL | Status: AC
  Administered 2016-01-12 (×3): 1000 mg via ORAL
  Filled 2016-01-12 (×3): qty 4

## 2016-01-12 MED ORDER — ALVIMOPAN 12 MG PO CAPS
12.0000 mg | ORAL_CAPSULE | Freq: Once | ORAL | Status: AC
  Administered 2016-01-13: 12 mg via ORAL
  Filled 2016-01-12: qty 1

## 2016-01-12 MED ORDER — NEOMYCIN SULFATE 500 MG PO TABS
1000.0000 mg | ORAL_TABLET | ORAL | Status: AC
  Administered 2016-01-12 (×3): 1000 mg via ORAL
  Filled 2016-01-12 (×3): qty 2

## 2016-01-12 MED ORDER — BOOST / RESOURCE BREEZE PO LIQD
1.0000 | Freq: Three times a day (TID) | ORAL | Status: DC
  Administered 2016-01-12 (×2): 1 via ORAL

## 2016-01-12 NOTE — Progress Notes (Signed)
Daily Progress Note   Patient Name: [name]       Date: 01/12/2016 DOB: September 29, 1932  Age: 82 y.o. MRN#: [id number] Attending Physician: Caren Griffins, MD Primary Care Physician: [name], MD Admit Date: 01/03/2016  Reason for Consultation/Follow-up: Establishing goals of care and Psychosocial/spiritual support  Subjective:  - continued emotional support to Mr Schad, allowed him space and time to verbalizes thoughts, feeling and fears regarding his current medical condition and consideration of treatment options  - he was able to verbalize his priority for continuation of quality life.  He does have AD completed and his daughter is his HPOA  -hope is for successful surgery and recuperation and return to baseline health.   Will continue to support holistically, family is aware of how to contact me with questions or concerns   Length of Stay: 9 days  Current Medications: Scheduled Meds:  . sodium chloride   Intravenous Once  . aspirin EC  81 mg Oral Daily  . furosemide  80 mg Oral Daily  . insulin aspart  0-15 Units Subcutaneous TID WC  . insulin aspart  0-5 Units Subcutaneous QHS  . isosorbide mononitrate  60 mg Oral BID WC  . metoprolol tartrate  12.5 mg Oral BID  . pantoprazole  40 mg Oral Q0600  . polyethylene glycol powder  1 Container Oral Once  . rosuvastatin  40 mg Oral q1800    Continuous Infusions: . sodium chloride      PRN Meds: acetaminophen **OR** acetaminophen, HYDROmorphone (DILAUDID) injection, nitroGLYCERIN, ondansetron **OR** ondansetron (ZOFRAN) IV, oxyCODONE  Physical Exam: Physical Exam  Constitutional: He is oriented to person, place, and time. He appears well-developed.  HENT:  Head: Normocephalic.  Cardiovascular: Normal rate and  regular rhythm.   Neurological: He is alert and oriented to person, place, and time.  Skin: Skin is warm and dry.                Vital Signs: BP 117/42 mmHg  Pulse 90  Temp(Src) 99.1 F (37.3 C) (Oral)  Resp 16  Ht 5\' 9"  (1.753 m)  Wt 80.9 kg (178 lb 5.6 oz)  BMI 26.33 kg/m2  SpO2 99% SpO2: SpO2: 99 % O2 Device: O2 Device: Not Delivered O2 Flow Rate:    Intake/output summary:  Intake/Output Summary (Last 24  hours) at 01/12/16 [id number] Last data filed at 01/12/16 0448  Gross per 24 hour  Intake    712 ml  Output   1150 ml  Net   -438 ml   LBM: Last BM Date: 01/11/16 Baseline Weight: Weight: 84.4 kg (186 lb 1.1 oz) Most recent weight: Weight: 80.9 kg (178 lb 5.6 oz)       Palliative Assessment/Data: Flowsheet Rows        Most Recent Value   Intake Tab    Referral Department  Hospitalist   Unit at Time of Referral  Med/Surg Unit   Palliative Care Primary Diagnosis  Cardiac   Date Notified  01/08/16   Palliative Care Type  New Palliative care   Reason for referral  Clarify Goals of Care   Date of Admission  01/03/16   # of days IP prior to Palliative referral  5   Clinical Assessment    Psychosocial & Spiritual Assessment    Palliative Care Outcomes       Additional Data Reviewed: CBC    Component Value Date/Time   WBC 7.3 01/10/2016 0634   RBC 2.75* 01/10/2016 0634   HGB 10.1* 01/11/2016 1150   HCT 30.3* 01/11/2016 1150   PLT 194 01/10/2016 0634   MCV 91.3 01/10/2016 0634   MCH 29.1 01/10/2016 0634   MCHC 31.9 01/10/2016 0634   RDW 14.9 01/10/2016 0634   LYMPHSABS 1.8 01/10/2016 0634   MONOABS 0.7 01/10/2016 0634   EOSABS 0.2 01/10/2016 0634   BASOSABS 0.0 01/10/2016 0634    CMP     Component Value Date/Time   NA 142 01/09/2016 0614   K 3.7 01/09/2016 0614   CL 104 01/09/2016 0614   CO2 26 01/09/2016 0614   GLUCOSE 167* 01/09/2016 0614   BUN 23* 01/09/2016 0614   BUN 27* 04/11/2013   CREATININE 1.93* 01/09/2016 0614   CALCIUM 9.1 01/09/2016 0614    PROT 6.2* 01/09/2016 0614   ALBUMIN 3.2* 01/09/2016 0614   AST 23 01/09/2016 0614   ALT 20 01/09/2016 0614   ALKPHOS 60 01/09/2016 0614   BILITOT 0.7 01/09/2016 0614   GFRNONAA 31* 01/09/2016 0614   GFRAA 36* 01/09/2016 0614       Problem List:  Patient Active Problem List   Diagnosis Date Noted  . Palliative care encounter   . DNR (do not resuscitate) discussion   . History of GI diverticular bleed   . Diabetes mellitus ([place]) 01/03/2016  . Diastolic CHF ([place]) [id number]  . Atrial fibrillation ([place]) 01/03/2016  . Angina at rest [facility])   . Chronic combined systolic and diastolic CHF (congestive heart failure) ([place])   . Pulmonary hypertension ([place])   . CKD (chronic kidney disease), stage III   . Pneumonia 12/01/2015  . Acute on chronic diastolic (congestive) heart failure ([place]) 11/20/2015  . Hypokalemia 11/18/2015  . HCAP (healthcare-associated pneumonia) 11/17/2015  . NSTEMI (non-ST elevated myocardial infarction) ([place])   . Acute GI bleeding   . Diverticulosis of colon with hemorrhage   . GI bleed 11/13/2015  . Symptomatic anemia 11/13/2015  . Acute-on-chronic kidney injury ([place])   . Acute blood loss anemia   . Tremor 09/01/2015  . Gout 09/09/2014  . Medicare annual wellness visit, initial 09/09/2014  . Advance care planning 09/09/2014  . Ejection fraction   . Heme positive stool 06/19/2014  . Long-term (current) use of anticoagulants   . RBBB (right bundle branch block)   . Carotid artery disease ([place])   .  CAD (coronary artery disease)   . Hx of CABG   . History of gastrointestinal bleeding   . Mitral regurgitation   . ED (erectile dysfunction) 03/01/2012  . CKD (chronic kidney disease) stage 3, GFR 30-59 ml/min 03/24/2011  . Atrial flutter ([place]) 08/24/2010  . Paroxysmal atrial fibrillation ([place]) 08/09/2010  . Peripheral sensory neuropathy due to type 2 diabetes mellitus ([place])   . GERD   . Sleep apnea   . Type 2 diabetes mellitus with peripheral neuropathy  ([name])   . Hyperlipidemia   . Hypertensive heart disease      Palliative Care Assessment & Plan    1.Code Status:  Full code      Code Status Orders        Start     Ordered   01/03/16 0413  Full code   Continuous     01/03/16 0412    Code Status History    Date Active Date Inactive Code Status Order ID Comments User Context   11/13/2015  4:46 PM 11/21/2015  3:50 PM Full Code [id number]  Kelvin Cellar, MD Inpatient   06/14/2014  5:44 PM 06/19/2014  7:02 PM Full Code [id number]  Belva Crome, MD Inpatient   06/13/2014 12:04 PM 06/14/2014  5:44 PM Full Code [id number]  Belva Crome, MD Inpatient   06/11/2014  4:26 PM 06/13/2014 12:04 PM Full Code [id number]  Belva Crome, MD Inpatient   06/08/2014 11:55 PM 06/11/2014  4:26 PM Full Code [id number]  Jacolyn Reedy, MD Inpatient       Thank you for allowing the Palliative Medicine Team to assist in the care of this patient.   Time In: 0815 Time Out: 0840 Total Time  25 min Prolonged Time Billed  no         Knox Royalty, NP  01/12/2016, 9:52 AM  Please contact Palliative Medicine Team phone at [phone] for questions and concerns.

## 2016-01-12 NOTE — Progress Notes (Signed)
UR COMPLETED  

## 2016-01-12 NOTE — Progress Notes (Signed)
PROGRESS NOTE  JULUIS STRATMANN [id number] DOB: [birth year] DOA: 01/03/2016 PCP: [name], MD Outpatient Specialists:    LOS: 9 days   Brief Narrative: [name] is an 82 year old male who was admitted on 01/03/2016 with chest pain and BRBPR x 3-4 days. History of diverticular bleeding with surgical intervention.  Assessment & Plan: Principal Problem:   GI bleed Active Problems:   CKD (chronic kidney disease) stage 3, GFR 30-59 ml/min   Acute blood loss anemia   Acute on chronic diastolic (congestive) heart failure ([name])   Diabetes mellitus ([name])   Diastolic CHF ([name])   Atrial fibrillation ([name])   Chronic combined systolic and diastolic CHF (congestive heart failure) ([name])   Pulmonary hypertension ([name])   CKD (chronic kidney disease), stage III   History of GI diverticular bleed   Palliative care encounter   DNR (do not resuscitate) discussion    Acute GI bleed/Acute blood loss anemia - Recurrent diverticular bleed despite cessation of Brilinta 5-6 weeks ago. - Colonoscopy 01/05/2016 revealed diverticulosis: severe in the left colon, moderate in the transverse colon and mild in the right colon. No evidence of active bleeding or old blood. - Nuclear RBC scan 01/10/2016 revealed increased activity in the left mid-abd, likely the distal transverse colon and proximal descending colon consistent with acute hemorrhage. - Transfuse for hemoglobin less than 8 - Cardiology cleared patient for surgery without further testing; patient is a high-risk surgical candidate - Suspected LGIB from diverticuli; Surgery discussed left hemicolectomy and sigmoidectomy with patient 01/11/2016. Surgery date pending. - received total 4U pRBC - monitor Hb, stable this morning - plan for surgery tomorrow am  CKD stage 3 - Appears euvolemic. Cr stable. Follow.   Chronic combined systolic and diastolic CHF/Acute on chronic diastolic (congestive) heart failure - Appears euvolemic - Continue lasix  and metoprolol - Cardiology following  Chest pain with coronary artery disease as well as NSTEMI. - Troponins are negative. Chest pain improved with blood transfusion 01/10/2016. - Continuing metoprolol and statin. Low-dose aspirin given his coronary artery disease. - Continue nitrostat prn  Diabetes mellitus - HgbA1c: 6.0 on arrival - Continue SSI  History of Atrial fibrillation  - No anticoagulation due to recurrent GIB - Current NSR  Essential hypertension - Blood pressure well controlled.  - Continue metoprolol   DVT prophylaxis: SCDs Code Status: Full Family Communication: None at bedside Disposition Plan: Home when ready Barriers for discharge: LGIB  Consultants:   Gastroenterology  Cardiology  Surgery  Palliative   Procedures:   Colonoscopy 01/05/2016  Antimicrobials:  None   Subjective: - NAD, no chest pain  Objective: Filed Vitals:   01/11/16 1436 01/11/16 2317 01/12/16 0500 01/12/16 0615  BP: 129/63 129/76  117/42  Pulse: 84 88  90  Temp: 98.5 F (36.9 C) 98.4 F (36.9 C)  99.1 F (37.3 C)  TempSrc: Oral Oral  Oral  Resp: 20 16  16   Height:      Weight:   82.283 kg (181 lb 6.4 oz) 80.9 kg (178 lb 5.6 oz)  SpO2: 100% 98%  99%    Intake/Output Summary (Last 24 hours) at 01/12/16 0758 Last data filed at 01/12/16 0448  Gross per 24 hour  Intake   1070 ml  Output   1150 ml  Net    -80 ml   Filed Weights   01/11/16 0508 01/12/16 0500 01/12/16 0615  Weight: 81.4 kg (179 lb 7.3 oz) 82.283 kg (181 lb 6.4 oz) 80.9 kg (178 lb  5.6 oz)   Examination: BP 117/42 mmHg  Pulse 90  Temp(Src) 99.1 F (37.3 C) (Oral)  Resp 16  Ht 5\' 9"  (1.753 m)  Wt 80.9 kg (178 lb 5.6 oz)  BMI 26.33 kg/m2  SpO2 99% General exam: NAD Respiratory system: Clear. No increased work of breathing. No wheezing, no crackles. Cardiovascular system: regular rate and rhythm, 2/6 systolic murmur, no gallops. No JVD. No peripheral edema.  Gastrointestinal system: Abdomen  is nondistended, soft and nontender. Normal bowel sounds heard. Central nervous system: AxOx3. No focal deficits. Extremities: No clubbing/cyanosis. Skin: No rashes.  Data Reviewed: I have personally reviewed following labs and imaging studies  CBC:  Recent Labs Lab 01/08/16 0514 01/09/16 0614 01/09/16 1812 01/10/16 0042 01/10/16 0634 01/11/16 1150  WBC 7.5 8.2 7.0 7.2 7.3  --   NEUTROABS  --   --   --  4.3 4.6  --   HGB 8.8* 8.9* 8.7* 8.0* 8.0* 10.1*  HCT 26.8* 28.1* 27.5* 25.3* 25.1* 30.3*  MCV 93.7 92.1 92.0 91.3 91.3  --   PLT 172 212 212 191 194  --    Basic Metabolic Panel:  Recent Labs Lab 01/06/16 0357 01/07/16 0300 01/08/16 0514 01/09/16 0614  NA 142 142 138 142  K 4.2 4.2 3.7 3.7  CL 111 110 104 104  CO2 25 27 26 26   GLUCOSE 135* 142* 151* 167*  BUN 12 17 19  23*  CREATININE 1.76* 2.06* 1.91* 1.93*  CALCIUM 8.7* 8.6* 8.8* 9.1   GFR: Estimated Creatinine Clearance: 30 mL/min (by C-G formula based on Cr of 1.93). Liver Function Tests:  Recent Labs Lab 01/07/16 0300 01/08/16 0514 01/09/16 0614  AST 21 20 23   ALT 19 18 20   ALKPHOS 60 56 60  BILITOT 0.3 0.7 0.7  PROT 5.6* 5.5* 6.2*  ALBUMIN 3.0* 2.9* 3.2*   No results for input(s): LIPASE, AMYLASE in the last 168 hours. No results for input(s): AMMONIA in the last 168 hours. Coagulation Profile: No results for input(s): INR, PROTIME in the last 168 hours. Cardiac Enzymes: No results for input(s): CKTOTAL, CKMB, CKMBINDEX, TROPONINI in the last 168 hours. BNP (last 3 results) No results for input(s): PROBNP in the last 8760 hours. HbA1C: No results for input(s): HGBA1C in the last 72 hours. CBG:  Recent Labs Lab 01/10/16 2120 01/11/16 0754 01/11/16 1216 01/11/16 1724 01/11/16 2220  GLUCAP 179* 156* 170* 158* 136*   Lipid Profile: No results for input(s): CHOL, HDL, LDLCALC, TRIG, CHOLHDL, LDLDIRECT in the last 72 hours. Thyroid Function Tests: No results for input(s): TSH, T4TOTAL,  FREET4, T3FREE, THYROIDAB in the last 72 hours. Anemia Panel: No results for input(s): VITAMINB12, FOLATE, FERRITIN, TIBC, IRON, RETICCTPCT in the last 72 hours. Urine analysis:    Component Value Date/Time   COLORURINE YELLOW 11/14/2015 [place] 11/14/2015 0241   LABSPEC 1.018 11/14/2015 0241   PHURINE 5.0 11/14/2015 0241   GLUCOSEU NEGATIVE 11/14/2015 0241   HGBUR NEGATIVE 11/14/2015 0241   HGBUR negative 09/12/2007 1028   BILIRUBINUR NEGATIVE 11/14/2015 0241   KETONESUR NEGATIVE 11/14/2015 0241   PROTEINUR NEGATIVE 11/14/2015 0241   UROBILINOGEN 0.2 06/14/2014 2210   NITRITE NEGATIVE 11/14/2015 0241   LEUKOCYTESUR NEGATIVE 11/14/2015 0241   Sepsis Labs: Invalid input(s): PROCALCITONIN, LACTICIDVEN  Recent Results (from the past 240 hour(s))  MRSA PCR Screening     Status: None   Collection Time: 01/03/16  5:57 PM  Result Value Ref Range Status   MRSA by PCR NEGATIVE  NEGATIVE Final    Comment:        The GeneXpert MRSA Assay (FDA approved for NASAL specimens only), is one component of a comprehensive MRSA colonization surveillance program. It is not intended to diagnose MRSA infection nor to guide or monitor treatment for MRSA infections.       Radiology Studies: Nm Gi Blood Loss  01/10/2016  CLINICAL DATA:  GI hemorrhage and rectal bleeding EXAM: NUCLEAR MEDICINE GASTROINTESTINAL BLEEDING SCAN TECHNIQUE: Sequential abdominal images were obtained following intravenous administration of Tc-63m labeled red blood cells. RADIOPHARMACEUTICALS:  25.1 mCi Tc-22m in-vitro labeled red cells. COMPARISON:  01/03/2016 FINDINGS: There are now areas of increased activity identified in the left mid abdomen which demonstrate mobility and likely lie within the distal transverse colon and proximal descending colon consistent with acute hemorrhage. No other focal area of hemorrhage is seen. IMPRESSION: Areas of increased activity in the far lateral left abdomen likely  related to the distal transverse and proximal descending colon. Electronically Signed   By: Inez Catalina M.D.   On: 01/10/2016 16:30     Scheduled Meds: . sodium chloride   Intravenous Once  . aspirin EC  81 mg Oral Daily  . furosemide  80 mg Oral Daily  . insulin aspart  0-15 Units Subcutaneous TID WC  . insulin aspart  0-5 Units Subcutaneous QHS  . isosorbide mononitrate  60 mg Oral BID WC  . metoprolol tartrate  12.5 mg Oral BID  . pantoprazole  40 mg Oral Q0600  . rosuvastatin  40 mg Oral q1800   Continuous Infusions: None.   Marzetta Board, MD, PhD [facility] Pager [phone]  If 7PM-7AM, please contact night-coverage [email or web address] Password [facility] 01/12/2016, 7:58 AM

## 2016-01-12 NOTE — Progress Notes (Signed)
Patient Name: [name] Date of Encounter: 01/12/2016  Primary Cardiologist: Dr. Gwenlyn Found (formerly Dr. [name])   Principal Problem:   GI bleed Active Problems:   CKD (chronic kidney disease) stage 3, GFR 30-59 ml/min   Acute blood loss anemia   Acute on chronic diastolic (congestive) heart failure ([name])   Diabetes mellitus ([name])   Diastolic CHF ([name])   Atrial fibrillation ([name])   Chronic combined systolic and diastolic CHF (congestive heart failure) ([place])   Pulmonary hypertension ([name])   CKD (chronic kidney disease), stage III   History of GI diverticular bleed   Palliative care encounter   DNR (do not resuscitate) discussion    SUBJECTIVE  Denies any CP or SOB overnight.   CURRENT MEDS . sodium chloride   Intravenous Once  . [START ON 01/13/2016] alvimopan  12 mg Oral Once  . aspirin EC  81 mg Oral Daily  . [START ON 01/13/2016] cefoTEtan (CEFOTAN) 2 GM IVPB  2 g Intravenous On Call to OR  . Chlorhexidine Gluconate Cloth  6 each Topical Once   And  . Chlorhexidine Gluconate Cloth  6 each Topical Once  . neomycin  1,000 mg Oral 3 times per day   And  . erythromycin  1,000 mg Oral 3 times per day  . feeding supplement  1 Container Oral TID BM  . furosemide  80 mg Oral Daily  . insulin aspart  0-15 Units Subcutaneous TID WC  . insulin aspart  0-5 Units Subcutaneous QHS  . isosorbide mononitrate  60 mg Oral BID WC  . magnesium oxide  400 mg Oral BID  . metoprolol tartrate  12.5 mg Oral BID  . pantoprazole  40 mg Oral Q0600  . rosuvastatin  40 mg Oral q1800    OBJECTIVE  Filed Vitals:   01/12/16 0615 01/12/16 1000 01/12/16 1413 01/12/16 1700  BP: 117/42 121/68 114/83 122/66  Pulse: 90 86 90 87  Temp: 99.1 F (37.3 C) 98.2 F (36.8 C) 98.6 F (37 C) 97.7 F (36.5 C)  TempSrc: Oral Oral Oral Oral  Resp: 16 18 18 18   Height:      Weight: 178 lb 5.6 oz (80.9 kg)     SpO2: 99% 98% 98% 100%    Intake/Output Summary (Last 24 hours) at 01/12/16 1831 Last data  filed at 01/12/16 0448  Gross per 24 hour  Intake    472 ml  Output    400 ml  Net     72 ml   Filed Weights   01/11/16 0508 01/12/16 0500 01/12/16 0615  Weight: 179 lb 7.3 oz (81.4 kg) 181 lb 6.4 oz (82.283 kg) 178 lb 5.6 oz (80.9 kg)    PHYSICAL EXAM  General: Pleasant, NAD. Neuro: Alert and oriented X 3. Moves all extremities spontaneously. Psych: Normal affect. HEENT:  Normal  Neck: Supple without bruits or JVD. Lungs:  Resp regular and unlabored, CTA. Heart: RRR no s3, s4, or murmurs. Abdomen: Soft, non-tender, non-distended, BS + x 4.  Extremities: No clubbing, cyanosis or edema. DP/PT/Radials 2+ and equal bilaterally.  Accessory Clinical Findings  CBC  Recent Labs  01/10/16 0042 01/10/16 0634 01/11/16 1150  WBC 7.2 7.3  --   NEUTROABS 4.3 4.6  --   HGB 8.0* 8.0* 10.1*  HCT 25.3* 25.1* 30.3*  MCV 91.3 91.3  --   PLT 191 194  --     TELE NSR without significant ventricular ectopy    ECG  No new EKG  Echocardiogram 11/17/2015  LV EF: 50% -  55%  ------------------------------------------------------------------- Indications:   Fever 780.6.  ------------------------------------------------------------------- History:  PMH: NSTEMI. Chest pain. Dyspnea. Coronary artery disease. Risk factors: Diabetes mellitus.  ------------------------------------------------------------------- Study Conclusions  - Left ventricle: There is hypokinesis of the mid anterolateral , anterior and distal lateral and anterior walls. The cavity size was normal. Systolic function was normal. The estimated ejection fraction was in the range of 50% to 55%. Wall motion was normal; there were no regional wall motion abnormalities. Features are consistent with a pseudonormal left ventricular filling pattern, with concomitant abnormal relaxation and increased filling pressure (grade 2 diastolic dysfunction). Doppler parameters are consistent with  elevated ventricular end-diastolic filling pressure. - Aortic root: The aortic root was normal in size. - Mitral valve: There was mild to moderate regurgitation. - Left atrium: The atrium was moderately dilated. - Right ventricle: Systolic function was normal. - Tricuspid valve: Structurally normal valve. There was moderate regurgitation. - Pulmonic valve: There was no regurgitation. - Pulmonary arteries: Systolic pressure was moderately increased. PA peak pressure: 51 mm Hg (S). - Inferior vena cava: The vessel was normal in size. - Pericardium, extracardiac: There was no pericardial effusion.    Radiology/Studies  Dg Chest 2 View  01/03/2016  CLINICAL DATA:  82 year old male with chest pain EXAM: CHEST  2 VIEW COMPARISON:  Radiograph dated 12/18/2015 FINDINGS: Two views of the chest demonstrate stable minimal left lung base linear atelectasis. There is no focal consolidation, pleural effusion, or pneumothorax. Stable cardiac silhouette. Median sternotomy wires and CABG vascular clips. No acute osseous pathology. IMPRESSION: No active cardiopulmonary disease. Electronically Signed   By: Anner Crete M.D.   On: 01/03/2016 03:02   Dg Chest 2 View  12/18/2015  CLINICAL DATA:  Pneumonia. EXAM: CHEST  2 VIEW COMPARISON:  November 17, 2015. FINDINGS: Stable cardiomediastinal silhouette. Status post coronary artery bypass graft. No pneumothorax or effusion is noted. Multilevel degenerative disc disease is noted in mid thoracic spine. Atherosclerosis thoracic aorta is noted. Right lung is clear. Mild left basilar subsegmental atelectasis is noted. IMPRESSION: Mild left basilar subsegmental atelectasis. Electronically Signed   By: Marijo Conception, M.D.   On: 12/18/2015 13:12   Nm Gi Blood Loss  01/10/2016  CLINICAL DATA:  82 year old GI hemorrhage and rectal bleeding EXAM: NUCLEAR MEDICINE GASTROINTESTINAL BLEEDING SCAN TECHNIQUE: Sequential abdominal images were obtained following intravenous  administration of Tc-29m labeled red blood cells. RADIOPHARMACEUTICALS:  25.1 mCi Tc-43m in-vitro labeled red cells. COMPARISON:  01/03/2016 FINDINGS: There are now areas of increased activity identified in the left mid abdomen which demonstrate mobility and likely lie within the distal transverse colon and proximal descending colon consistent with acute hemorrhage. No other focal area of hemorrhage is seen. IMPRESSION: Areas of increased activity in the far lateral left abdomen likely related to the distal transverse and proximal descending colon. Electronically Signed   By: Inez Catalina M.D.   On: 01/10/2016 16:30   Nm Gi Blood Loss  01/03/2016  CLINICAL DATA:  History of GI diverticular bleed. GI bleeding. Dark blood in the stools noted 4 days ago. EXAM: NUCLEAR MEDICINE GASTROINTESTINAL BLEEDING SCAN TECHNIQUE: Sequential abdominal images were obtained following intravenous administration of Tc-35m labeled red blood cells. RADIOPHARMACEUTICALS:  25.0 mCi Tc-61m in-vitro labeled red cells. COMPARISON:  09/13/2008 FINDINGS: Early level activity is identified. Delayed images are performed, demonstrating no site of active bleeding. IMPRESSION: A site of active bleeding is not identified. Electronically Signed   By: Nolon Nations M.D.  On: 01/03/2016 16:27    ASSESSMENT AND PLAN  Mr. Drabik is a pleasant 82 year old Caucasian male with past medical history of severe diverticulosis, multiple GI bleed since 1981, carotid artery disease s/p R CEA by [name] '94, HTN, HLD, DM, CKD stage IV, CAD s/p CABG 1996 with redo 2006 by [name], and h/o PAF not on coumadin due to recurrent GI bleed presented with recurrent GI bleed after recent NSTEMI on medical management. Cardiology consulted for preop clearance.    1. Preop clearance for recurrent GI bleeding and severe pancolonic diverticulosis - Patient still has exertional angina, although current anemia can contribute to angina, he likely  also has significant underlying coronary artery disease given persistent ST depression in the lateral leads. He also has anterior wall motion abnormality seen on recent echocardiogram despite having a stable EF of 50-55%. - Patient is higher than normal risk for surgical procedure. Unable to take him to the cath lab and put in stent at this time due to active GI bleed and stage IV CKD.  - he is going for surgery tomorrow, i think from cardiac and volume perspective, he is already tuned up to the best possible condition given the circumstance and inability to intervene on his coronary issue. I have discussed with the patient that he is at higher risk for surgery and he understands that. Please see Dr. Evette Georges note yesterday. Cardiology will be available postoperatively in case any issue arise.    2. Acute GI bleed with multiple GI bleed since 1981, likely related to severe diverticulosis - colonoscopy on 2/21 worse diverticulosis on L compare to R, no active bleeding - RBC tagged scan on 2/26 increased activity identified on L abdomen likely represent acute hemorrhage  3. CAD s/p CABG 1996 with redo 2006 by [name]  4. CKD stage IV  5. carotid artery disease s/p R CEA by [name] '94  6. HTN 7. HLD 8. DM 9. h/o PAF not on coumadin due to recurrent GI bleed    Signed, Almyra Deforest PA-C Pager: [phone]  Patient seen and examined. Agree with assessment and plan. No chest pain or dyspnea. For planned GI surgery tomorrow. Will need close post op hemodynamic monitoring and ECG. Would recommend telemetry post op.   Troy Sine, MD, [facility] 01/12/2016 6:48 PM

## 2016-01-12 NOTE — Progress Notes (Addendum)
Nutrition Follow-up  DOCUMENTATION CODES:   Not applicable  INTERVENTION:   -Boost Breeze po TID, each supplement provides 250 kcal and 9 grams of protein -Consider initiation of nutrition support if prolonged NPO/clear liquid status is anticipated  NUTRITION DIAGNOSIS:   Inadequate oral intake related to altered GI function as evidenced by  (NPO/clear liquid diet x 9 days).  GOAL:   Patient will meet greater than or equal to 90% of their needs  MONITOR:   PO intake, Supplement acceptance, Diet advancement, Labs, Weight trends, Skin, I & O's  REASON FOR ASSESSMENT:   NPO/Clear Liquid Diet    ASSESSMENT:   [name] is an 82 year old male who was admitted on 01/03/2016 with chest pain and BRBPR x 3-4 days. History of diverticular bleeding with surgical intervention.  Pt admitted with recurrent diverticular GIB.   Pt has been without adequate nutrition over the past 9 days, failing to advance beyond clear liquid diet.   Pt was speaking on phone at time of visits x 3. Unable to perform nutrition-focused physical exam, however, pt did not appear to have any signs of fat or muscle depletion. He voiced that he will undergo surgery tomorrow.   Case discussed with RN. She reports pt has been tolerating clear liquid diet well and consumed all of his breakfast tray. Meal completion records reveal 75-100%. She confirms that pt will undergo lt hemicolectomy and sigmoidectomy today.   Wt hx reviewed. Noted pt's wt fluctuates at baseline, but has ranged between 180-190# for > 1 year.   Palliative care team following.  Labs reviewed: CBGS: 136-189.   Diet Order:  Diet clear liquid Room service appropriate?: Yes; Fluid consistency:: Thin Diet NPO time specified  Skin:  Reviewed, no issues  Last BM:  01/12/16  Height:   Ht Readings from Last 1 Encounters:  01/03/16 5\' 9"  (1.753 m)    Weight:   Wt Readings from Last 1 Encounters:  01/12/16 178 lb 5.6 oz (80.9 kg)     Ideal Body Weight:  72.7 kg  BMI:  Body mass index is 26.33 kg/(m^2).  Estimated Nutritional Needs:   Kcal:  1800-2000  Protein:  95-110 grams  Fluid:  1.8-2.0 L  EDUCATION NEEDS:   No education needs identified at this time  Lillan Mccreadie A. Jimmye Norman, RD, LDN, CDE Pager: [phone] After hours Pager: [phone]

## 2016-01-12 NOTE — Progress Notes (Signed)
7 Days Post-Op  Subjective: Dr. Rosendo Gros discussed extended open L hemicolectomy and sigmoidectomy.  Pt is agreeable, Cardiology has seen and given clearance for surgery.  We plan to do this tomorrow.  Pt understands and is agreeable.  Objective: Vital signs in last 24 hours: Temp:  [98.4 F (36.9 C)-99.1 F (37.3 C)] 99.1 F (37.3 C) (02/28 0615) Pulse Rate:  [84-90] 90 (02/28 0615) Resp:  [16-20] 16 (02/28 0615) BP: (117-129)/(42-76) 117/42 mmHg (02/28 0615) SpO2:  [98 %-100 %] 99 % (02/28 0615) Weight:  [80.9 kg (178 lb 5.6 oz)-82.283 kg (181 lb 6.4 oz)] 80.9 kg (178 lb 5.6 oz) (02/28 0615) Last BM Date: 01/11/16 1070 Po 1150 urine  Diet: clears TM 99.1 Last transfusion 01/10/16 H/H yesterday 10/30 Afebrile, VSS Intake/Output from previous day: 02/27 0701 - 02/28 0700 In: 1070 [P.O.:1070] Out: 1150 [Urine:1150] Intake/Output this shift:    General appearance: alert, cooperative and no distress GI: soft, non-tender; bowel sounds normal; no masses,  no organomegaly  Lab Results:   Recent Labs  01/10/16 0042 01/10/16 0634 01/11/16 1150  WBC 7.2 7.3  --   HGB 8.0* 8.0* 10.1*  HCT 25.3* 25.1* 30.3*  PLT 191 194  --     BMET No results for input(s): NA, K, CL, CO2, GLUCOSE, BUN, CREATININE, CALCIUM in the last 72 hours. PT/INR No results for input(s): LABPROT, INR in the last 72 hours.   Recent Labs Lab 01/07/16 0300 01/08/16 0514 01/09/16 0614  AST 21 20 23   ALT 19 18 20   ALKPHOS 60 56 60  BILITOT 0.3 0.7 0.7  PROT 5.6* 5.5* 6.2*  ALBUMIN 3.0* 2.9* 3.2*     Lipase     Component Value Date/Time   LIPASE 29 09/01/2008 2100     Studies/Results: Nm Gi Blood Loss  01/10/2016  CLINICAL DATA:  GI hemorrhage and rectal bleeding EXAM: NUCLEAR MEDICINE GASTROINTESTINAL BLEEDING SCAN TECHNIQUE: Sequential abdominal images were obtained following intravenous administration of Tc-75m labeled red blood cells. RADIOPHARMACEUTICALS:  25.1 mCi Tc-10m in-vitro  labeled red cells. COMPARISON:  01/03/2016 FINDINGS: There are now areas of increased activity identified in the left mid abdomen which demonstrate mobility and likely lie within the distal transverse colon and proximal descending colon consistent with acute hemorrhage. No other focal area of hemorrhage is seen. IMPRESSION: Areas of increased activity in the far lateral left abdomen likely related to the distal transverse and proximal descending colon. Electronically Signed   By: Inez Catalina M.D.   On: 01/10/2016 16:30    Medications: . sodium chloride   Intravenous Once  . aspirin EC  81 mg Oral Daily  . furosemide  80 mg Oral Daily  . insulin aspart  0-15 Units Subcutaneous TID WC  . insulin aspart  0-5 Units Subcutaneous QHS  . isosorbide mononitrate  60 mg Oral BID WC  . metoprolol tartrate  12.5 mg Oral BID  . pantoprazole  40 mg Oral Q0600  . rosuvastatin  40 mg Oral q1800       Assessment/Plan Recurrent Diverticular GI bleed Transfused yesterday Chest pin with NSTEMI [id number] Hx of systolic/diastolic CHF Atrial fibrillation AODM Hypertension Chronic kidney disease, Stage III Antibiotics:  None DVT:  SCD    Plan:   Extended open L hemicolectomy and sigmoidectomy, tomorrow.  I will start him on Bowel prep and antibiotics today. Dr.Ramirez will see and go over surgery with him again later today.  He is agreeable with the current plan.  I have done pre op  orders, including labs for AM, Miralax bowel prep, antibiotics, and started fluids at 2300 hours tonight.    LOS: 9 days    Kathey Simer 01/12/2016

## 2016-01-13 ENCOUNTER — Encounter (HOSPITAL_COMMUNITY)
Admission: EM | Disposition: A | Discharge status: Skilled Nursing Facility | Payer: Self-pay | Source: Home / Self Care | Attending: Internal Medicine

## 2016-01-13 ENCOUNTER — Inpatient Hospital Stay (HOSPITAL_COMMUNITY): Payer: Medicare Other | Admitting: Anesthesiology

## 2016-01-13 ENCOUNTER — Encounter (HOSPITAL_COMMUNITY): Payer: Self-pay | Admitting: Anesthesiology

## 2016-01-13 HISTORY — PX: COLON RESECTION: SHX5231

## 2016-01-13 LAB — HEMOGLOBIN A1C
HEMOGLOBIN A1C: 6 % — AB (ref 4.8–5.6)
Mean Plasma Glucose: 126 mg/dL

## 2016-01-13 LAB — BASIC METABOLIC PANEL
Anion gap: 11 (ref 5–15)
BUN: 28 mg/dL — AB (ref 6–20)
CALCIUM: 8.9 mg/dL (ref 8.9–10.3)
CO2: 28 mmol/L (ref 22–32)
Chloride: 102 mmol/L (ref 101–111)
Creatinine, Ser: 1.83 mg/dL — ABNORMAL HIGH (ref 0.61–1.24)
GFR calc Af Amer: 38 mL/min — ABNORMAL LOW (ref >60)
GFR, EST NON AFRICAN AMERICAN: 33 mL/min — AB (ref >60)
GLUCOSE: 161 mg/dL — AB (ref 65–99)
POTASSIUM: 3.6 mmol/L (ref 3.5–5.1)
SODIUM: 141 mmol/L (ref 135–145)

## 2016-01-13 LAB — GLUCOSE, CAPILLARY
GLUCOSE-CAPILLARY: 139 mg/dL — AB (ref 65–99)
GLUCOSE-CAPILLARY: 154 mg/dL — AB (ref 65–99)
GLUCOSE-CAPILLARY: 175 mg/dL — AB (ref 65–99)
Glucose-Capillary: 177 mg/dL — ABNORMAL HIGH (ref 65–99)
Glucose-Capillary: 284 mg/dL — ABNORMAL HIGH (ref 65–99)

## 2016-01-13 LAB — CBC
HCT: 29 % — ABNORMAL LOW (ref 39.0–52.0)
Hemoglobin: 9.2 g/dL — ABNORMAL LOW (ref 13.0–17.0)
MCH: 28.8 pg (ref 26.0–34.0)
MCHC: 31.7 g/dL (ref 30.0–36.0)
MCV: 90.9 fL (ref 78.0–100.0)
PLATELETS: 207 10*3/uL (ref 150–400)
RBC: 3.19 MIL/uL — AB (ref 4.22–5.81)
RDW: 14.2 % (ref 11.5–15.5)
WBC: 7.7 10*3/uL (ref 4.0–10.5)

## 2016-01-13 LAB — PROTIME-INR
INR: 1.18 (ref 0.00–1.49)
Prothrombin Time: 15.2 seconds (ref 11.6–15.2)

## 2016-01-13 LAB — SURGICAL PCR SCREEN
MRSA, PCR: NEGATIVE
Staphylococcus aureus: NEGATIVE

## 2016-01-13 LAB — APTT: APTT: 31 s (ref 24–37)

## 2016-01-13 LAB — MAGNESIUM: Magnesium: 2.1 mg/dL (ref 1.7–2.4)

## 2016-01-13 SURGERY — COLON RESECTION
Anesthesia: General | Site: Abdomen

## 2016-01-13 MED ORDER — ASPIRIN EC 81 MG PO TBEC
81.0000 mg | DELAYED_RELEASE_TABLET | Freq: Every day | ORAL | Status: DC
  Administered 2016-01-16 – 2016-01-20 (×5): 81 mg via ORAL
  Filled 2016-01-13 (×5): qty 1

## 2016-01-13 MED ORDER — HYDROMORPHONE 1 MG/ML IV SOLN
INTRAVENOUS | Status: AC
  Filled 2016-01-13: qty 25

## 2016-01-13 MED ORDER — ONDANSETRON HCL 4 MG/2ML IJ SOLN
INTRAMUSCULAR | Status: AC
  Filled 2016-01-13: qty 2

## 2016-01-13 MED ORDER — DEXAMETHASONE SODIUM PHOSPHATE 4 MG/ML IJ SOLN
INTRAMUSCULAR | Status: AC
  Filled 2016-01-13: qty 2

## 2016-01-13 MED ORDER — LIDOCAINE HCL (CARDIAC) 20 MG/ML IV SOLN
INTRAVENOUS | Status: AC
  Filled 2016-01-13: qty 5

## 2016-01-13 MED ORDER — LIDOCAINE HCL (CARDIAC) 20 MG/ML IV SOLN
INTRAVENOUS | Status: DC | PRN
  Administered 2016-01-13: 60 mg via INTRAVENOUS

## 2016-01-13 MED ORDER — PANTOPRAZOLE SODIUM 40 MG IV SOLR
40.0000 mg | Freq: Two times a day (BID) | INTRAVENOUS | Status: DC
  Administered 2016-01-13 – 2016-01-17 (×8): 40 mg via INTRAVENOUS
  Filled 2016-01-13 (×8): qty 40

## 2016-01-13 MED ORDER — ONDANSETRON HCL 4 MG/2ML IJ SOLN: 4.0000 mg | Freq: Four times a day (QID) | INTRAMUSCULAR | Status: DC | PRN

## 2016-01-13 MED ORDER — DEXMEDETOMIDINE HCL 200 MCG/2ML IV SOLN
INTRAVENOUS | Status: DC | PRN
  Administered 2016-01-13: 4 ug via INTRAVENOUS
  Administered 2016-01-13: 8 ug via INTRAVENOUS

## 2016-01-13 MED ORDER — FENTANYL CITRATE (PF) 250 MCG/5ML IJ SOLN
INTRAMUSCULAR | Status: AC
  Filled 2016-01-13: qty 5

## 2016-01-13 MED ORDER — METOPROLOL TARTRATE 1 MG/ML IV SOLN
2.5000 mg | Freq: Four times a day (QID) | INTRAVENOUS | Status: DC
  Administered 2016-01-13 – 2016-01-15 (×9): 2.5 mg via INTRAVENOUS
  Filled 2016-01-13 (×8): qty 5

## 2016-01-13 MED ORDER — GLYCOPYRROLATE 0.2 MG/ML IJ SOLN
INTRAMUSCULAR | Status: DC | PRN
  Administered 2016-01-13: .8 mg via INTRAVENOUS

## 2016-01-13 MED ORDER — DEXMEDETOMIDINE HCL IN NACL 200 MCG/50ML IV SOLN
INTRAVENOUS | Status: AC
  Filled 2016-01-13: qty 50

## 2016-01-13 MED ORDER — ISOSORBIDE MONONITRATE ER 60 MG PO TB24
60.0000 mg | ORAL_TABLET | Freq: Two times a day (BID) | ORAL | Status: DC
  Administered 2016-01-15 – 2016-01-20 (×11): 60 mg via ORAL
  Filled 2016-01-13 (×10): qty 1

## 2016-01-13 MED ORDER — PHENYLEPHRINE HCL 10 MG/ML IJ SOLN
10.0000 mg | INTRAMUSCULAR | Status: DC | PRN
  Administered 2016-01-13: 20 ug/min via INTRAVENOUS

## 2016-01-13 MED ORDER — HYDROMORPHONE 1 MG/ML IV SOLN: INTRAVENOUS | Status: DC

## 2016-01-13 MED ORDER — PROPOFOL 10 MG/ML IV BOLUS
INTRAVENOUS | Status: DC | PRN
  Administered 2016-01-13: 130 mg via INTRAVENOUS

## 2016-01-13 MED ORDER — SODIUM CHLORIDE 0.9 % IV SOLN
INTRAVENOUS | Status: DC
Start: 2016-01-13 — End: 2016-01-14
  Administered 2016-01-13: 10 mL/h via INTRAVENOUS

## 2016-01-13 MED ORDER — ACETAMINOPHEN 10 MG/ML IV SOLN
1000.0000 mg | Freq: Four times a day (QID) | INTRAVENOUS | Status: DC
  Administered 2016-01-13 (×2): 1000 mg via INTRAVENOUS
  Filled 2016-01-13 (×4): qty 100

## 2016-01-13 MED ORDER — NEOSTIGMINE METHYLSULFATE 10 MG/10ML IV SOLN
INTRAVENOUS | Status: DC | PRN
  Administered 2016-01-13: 5 mg via INTRAVENOUS

## 2016-01-13 MED ORDER — HYDROMORPHONE HCL 1 MG/ML IJ SOLN: 0.5000 mg | INTRAMUSCULAR | Status: DC | PRN

## 2016-01-13 MED ORDER — FENTANYL CITRATE (PF) 100 MCG/2ML IJ SOLN
INTRAMUSCULAR | Status: AC
  Administered 2016-01-13: 50 ug via INTRAVENOUS
  Filled 2016-01-13: qty 2

## 2016-01-13 MED ORDER — HYDROMORPHONE 1 MG/ML IV SOLN
INTRAVENOUS | Status: DC
Start: 2016-01-13 — End: 2016-01-18
  Administered 2016-01-13: 2.2 mg via INTRAVENOUS
  Administered 2016-01-14: 2.4 mg via INTRAVENOUS
  Administered 2016-01-14: 0.6 mg via INTRAVENOUS
  Administered 2016-01-14: 0.8 mg via INTRAVENOUS
  Administered 2016-01-14: 1.2 mg via INTRAVENOUS
  Administered 2016-01-14 (×2): 2.2 mg via INTRAVENOUS
  Administered 2016-01-15: 1.4 mg via INTRAVENOUS
  Administered 2016-01-15: 0.4 mg via INTRAVENOUS
  Administered 2016-01-15: 1.6 mg via INTRAVENOUS
  Administered 2016-01-15: 2.6 mg via INTRAVENOUS
  Administered 2016-01-15: 1.2 mg via INTRAVENOUS
  Administered 2016-01-15: 2.8 mg via INTRAVENOUS
  Administered 2016-01-16: 1.4 mg via INTRAVENOUS
  Administered 2016-01-16: 1.5 mg via INTRAVENOUS
  Administered 2016-01-16: 0.6 mg via INTRAVENOUS
  Administered 2016-01-16: 1.2 mg via INTRAVENOUS
  Administered 2016-01-16: 08:00:00 via INTRAVENOUS
  Administered 2016-01-17: 2 mg via INTRAVENOUS
  Administered 2016-01-17: 1 mg via INTRAVENOUS
  Administered 2016-01-17: 1.2 mg via INTRAVENOUS
  Administered 2016-01-18: 0.2 mg via INTRAVENOUS
  Administered 2016-01-18: 2 mg via INTRAVENOUS
  Administered 2016-01-18: 1.8 mg via INTRAVENOUS
  Filled 2016-01-13: qty 25

## 2016-01-13 MED ORDER — DIPHENHYDRAMINE HCL 12.5 MG/5ML PO ELIX
12.5000 mg | ORAL_SOLUTION | Freq: Four times a day (QID) | ORAL | Status: DC | PRN
  Filled 2016-01-13: qty 5

## 2016-01-13 MED ORDER — SODIUM CHLORIDE 0.9 % IV SOLN
INTRAVENOUS | Status: DC
Start: 2016-01-13 — End: 2016-01-15
  Administered 2016-01-13: 20:00:00 via INTRAVENOUS

## 2016-01-13 MED ORDER — HYDROMORPHONE 1 MG/ML IV SOLN
INTRAVENOUS | Status: DC
  Administered 2016-01-13: 16:00:00 via INTRAVENOUS

## 2016-01-13 MED ORDER — FENTANYL CITRATE (PF) 100 MCG/2ML IJ SOLN
25.0000 ug | INTRAMUSCULAR | Status: DC | PRN
  Administered 2016-01-13 (×2): 50 ug via INTRAVENOUS

## 2016-01-13 MED ORDER — HYDROCODONE-ACETAMINOPHEN 7.5-325 MG PO TABS: 1.0000 | ORAL_TABLET | Freq: Once | ORAL | Status: DC | PRN

## 2016-01-13 MED ORDER — ALVIMOPAN 12 MG PO CAPS
12.0000 mg | ORAL_CAPSULE | Freq: Two times a day (BID) | ORAL | Status: DC
  Administered 2016-01-14 – 2016-01-17 (×8): 12 mg via ORAL
  Filled 2016-01-13 (×13): qty 1

## 2016-01-13 MED ORDER — FENTANYL CITRATE (PF) 100 MCG/2ML IJ SOLN
INTRAMUSCULAR | Status: DC | PRN
  Administered 2016-01-13: 150 ug via INTRAVENOUS
  Administered 2016-01-13 (×3): 50 ug via INTRAVENOUS
  Administered 2016-01-13: 100 ug via INTRAVENOUS
  Administered 2016-01-13 (×2): 50 ug via INTRAVENOUS

## 2016-01-13 MED ORDER — SODIUM CHLORIDE 0.9% FLUSH: 9.0000 mL | INTRAVENOUS | Status: DC | PRN

## 2016-01-13 MED ORDER — NALOXONE HCL 0.4 MG/ML IJ SOLN: 0.4000 mg | INTRAMUSCULAR | Status: DC | PRN

## 2016-01-13 MED ORDER — ROCURONIUM BROMIDE 50 MG/5ML IV SOLN
INTRAVENOUS | Status: AC
  Filled 2016-01-13: qty 1

## 2016-01-13 MED ORDER — ONDANSETRON HCL 4 MG/2ML IJ SOLN
INTRAMUSCULAR | Status: DC | PRN
  Administered 2016-01-13: 4 mg via INTRAVENOUS

## 2016-01-13 MED ORDER — DEXTROSE 5 % IV SOLN: 2.0000 g | Freq: Two times a day (BID) | INTRAVENOUS | Status: DC

## 2016-01-13 MED ORDER — EPHEDRINE SULFATE 50 MG/ML IJ SOLN
INTRAMUSCULAR | Status: DC | PRN
  Administered 2016-01-13: 10 mg via INTRAVENOUS

## 2016-01-13 MED ORDER — DEXTROSE 5 % IV SOLN
2.0000 g | Freq: Once | INTRAVENOUS | Status: AC
  Administered 2016-01-14: 2 g via INTRAVENOUS
  Filled 2016-01-13 (×2): qty 2

## 2016-01-13 MED ORDER — PROPOFOL 10 MG/ML IV BOLUS
INTRAVENOUS | Status: AC
  Filled 2016-01-13: qty 20

## 2016-01-13 MED ORDER — DIPHENHYDRAMINE HCL 50 MG/ML IJ SOLN: 12.5000 mg | Freq: Four times a day (QID) | INTRAMUSCULAR | Status: DC | PRN

## 2016-01-13 MED ORDER — PHENYLEPHRINE HCL 10 MG/ML IJ SOLN
10.0000 mg | INTRAVENOUS | Status: DC | PRN
  Administered 2016-01-13: 25 ug/min via INTRAVENOUS

## 2016-01-13 MED ORDER — SODIUM CHLORIDE 0.9 % IV SOLN
INTRAVENOUS | Status: DC | PRN
Start: 2016-01-13 — End: 2016-01-13
  Administered 2016-01-13: 10:00:00 via INTRAVENOUS

## 2016-01-13 MED ORDER — HYDRALAZINE HCL 20 MG/ML IJ SOLN
INTRAMUSCULAR | Status: AC
  Administered 2016-01-13: 5 mg via INTRAVENOUS
  Filled 2016-01-13: qty 1

## 2016-01-13 MED ORDER — CEFOTETAN DISODIUM-DEXTROSE 2-2.08 GM-% IV SOLR
INTRAVENOUS | Status: AC
  Filled 2016-01-13: qty 50

## 2016-01-13 MED ORDER — ROCURONIUM BROMIDE 100 MG/10ML IV SOLN
INTRAVENOUS | Status: DC | PRN
  Administered 2016-01-13: 20 mg via INTRAVENOUS
  Administered 2016-01-13: 50 mg via INTRAVENOUS

## 2016-01-13 MED ORDER — 0.9 % SODIUM CHLORIDE (POUR BTL) OPTIME
TOPICAL | Status: DC | PRN
  Administered 2016-01-13 (×4): 1000 mL

## 2016-01-13 MED ORDER — HYDRALAZINE HCL 20 MG/ML IJ SOLN
5.0000 mg | Freq: Once | INTRAMUSCULAR | Status: AC
  Administered 2016-01-13: 5 mg via INTRAVENOUS

## 2016-01-13 MED ORDER — INSULIN ASPART 100 UNIT/ML ~~LOC~~ SOLN
0.0000 [IU] | Freq: Three times a day (TID) | SUBCUTANEOUS | Status: DC
  Administered 2016-01-13 – 2016-01-14 (×2): 3 [IU] via SUBCUTANEOUS
  Administered 2016-01-14 (×2): 5 [IU] via SUBCUTANEOUS
  Administered 2016-01-15 – 2016-01-16 (×3): 2 [IU] via SUBCUTANEOUS
  Administered 2016-01-16: 3 [IU] via SUBCUTANEOUS
  Administered 2016-01-16: 5 [IU] via SUBCUTANEOUS
  Administered 2016-01-17 (×3): 3 [IU] via SUBCUTANEOUS
  Administered 2016-01-18: 5 [IU] via SUBCUTANEOUS
  Administered 2016-01-18 – 2016-01-20 (×7): 3 [IU] via SUBCUTANEOUS

## 2016-01-13 SURGICAL SUPPLY — 62 items
BLADE SURG ROTATE 9660 (MISCELLANEOUS) IMPLANT
CANISTER SUCTION 2500CC (MISCELLANEOUS) ×3 IMPLANT
CHLORAPREP W/TINT 26ML (MISCELLANEOUS) ×3 IMPLANT
COVER MAYO STAND STRL (DRAPES) ×3 IMPLANT
DRAPE LAPAROSCOPIC ABDOMINAL (DRAPES) ×3 IMPLANT
DRAPE PROXIMA HALF (DRAPES) ×2 IMPLANT
DRAPE UTILITY XL STRL (DRAPES) ×7 IMPLANT
DRAPE WARM FLUID 44X44 (DRAPE) ×3 IMPLANT
DRSG OPSITE POSTOP 4X10 (GAUZE/BANDAGES/DRESSINGS) ×2 IMPLANT
DRSG OPSITE POSTOP 4X8 (GAUZE/BANDAGES/DRESSINGS) IMPLANT
ELECT BLADE 6.5 EXT (BLADE) ×3 IMPLANT
ELECT CAUTERY BLADE 6.4 (BLADE) ×6 IMPLANT
ELECT REM PT RETURN 9FT ADLT (ELECTROSURGICAL) ×3
ELECTRODE REM PT RTRN 9FT ADLT (ELECTROSURGICAL) ×1 IMPLANT
GLOVE BIO SURGEON STRL SZ7.5 (GLOVE) ×10 IMPLANT
GLOVE BIOGEL PI IND STRL 6.5 (GLOVE) IMPLANT
GLOVE BIOGEL PI IND STRL 7.0 (GLOVE) IMPLANT
GLOVE BIOGEL PI IND STRL 8 (GLOVE) ×2 IMPLANT
GLOVE BIOGEL PI INDICATOR 6.5 (GLOVE) ×2
GLOVE BIOGEL PI INDICATOR 7.0 (GLOVE) ×2
GLOVE BIOGEL PI INDICATOR 8 (GLOVE) ×8
GLOVE SURG SS PI 7.0 STRL IVOR (GLOVE) ×2 IMPLANT
GOWN STRL REUS W/ TWL LRG LVL3 (GOWN DISPOSABLE) ×4 IMPLANT
GOWN STRL REUS W/ TWL XL LVL3 (GOWN DISPOSABLE) ×2 IMPLANT
GOWN STRL REUS W/TWL LRG LVL3 (GOWN DISPOSABLE) ×6
GOWN STRL REUS W/TWL XL LVL3 (GOWN DISPOSABLE) ×6
HANDLE SUCTION POOLE (INSTRUMENTS) IMPLANT
KIT BASIN OR (CUSTOM PROCEDURE TRAY) ×3 IMPLANT
KIT ROOM TURNOVER OR (KITS) ×3 IMPLANT
LEGGING LITHOTOMY PAIR STRL (DRAPES) ×2 IMPLANT
LIGASURE 5MM LAPAROSCOPIC (INSTRUMENTS) ×2 IMPLANT
LIGASURE IMPACT 36 18CM CVD LR (INSTRUMENTS) ×2 IMPLANT
NS IRRIG 1000ML POUR BTL (IV SOLUTION) ×6 IMPLANT
PACK GENERAL/GYN (CUSTOM PROCEDURE TRAY) ×3 IMPLANT
PAD ARMBOARD 7.5X6 YLW CONV (MISCELLANEOUS) ×3 IMPLANT
PENCIL BUTTON HOLSTER BLD 10FT (ELECTRODE) ×3 IMPLANT
SPONGE LAP 18X18 X RAY DECT (DISPOSABLE) ×6 IMPLANT
STAPLER CIRC ILS CVD 33MM 37CM (STAPLE) ×2 IMPLANT
STAPLER CUT CVD 40MM BLUE (STAPLE) ×2 IMPLANT
STAPLER PROXIMATE 75MM BLUE (STAPLE) ×2 IMPLANT
STAPLER VISISTAT 35W (STAPLE) ×3 IMPLANT
SUCTION POOLE HANDLE (INSTRUMENTS) ×3
SUCTION POOLE TIP (SUCTIONS) ×3 IMPLANT
SURGILUBE 2OZ TUBE FLIPTOP (MISCELLANEOUS) IMPLANT
SUT PDS AB 1 TP1 96 (SUTURE) ×6 IMPLANT
SUT PROLENE 0 SH 30 (SUTURE) ×2 IMPLANT
SUT PROLENE 2 0 CT2 30 (SUTURE) IMPLANT
SUT PROLENE 2 0 KS (SUTURE) IMPLANT
SUT SILK 2 0 SH CR/8 (SUTURE) ×3 IMPLANT
SUT SILK 2 0 TIES 10X30 (SUTURE) ×3 IMPLANT
SUT SILK 3 0 SH CR/8 (SUTURE) ×5 IMPLANT
SUT SILK 3 0 TIES 10X30 (SUTURE) ×3 IMPLANT
SUT VIC AB 3-0 SH 18 (SUTURE) IMPLANT
SUT VIC AB 3-0 SH 27 (SUTURE) ×3
SUT VIC AB 3-0 SH 27X BRD (SUTURE) IMPLANT
SYR BULB IRRIGATION 50ML (SYRINGE) ×3 IMPLANT
TOWEL OR 17X26 10 PK STRL BLUE (TOWEL DISPOSABLE) ×6 IMPLANT
TRAY FOLEY CATH 16FRSI W/METER (SET/KITS/TRAYS/PACK) ×3 IMPLANT
TRAY PROCTOSCOPIC FIBER OPTIC (SET/KITS/TRAYS/PACK) IMPLANT
TUBE CONNECTING 12'X1/4 (SUCTIONS) ×1
TUBE CONNECTING 12X1/4 (SUCTIONS) ×2 IMPLANT
YANKAUER SUCT BULB TIP NO VENT (SUCTIONS) ×3 IMPLANT

## 2016-01-13 NOTE — Progress Notes (Signed)
8 Days Post-Op  Subjective: Pt with no acute changes.  Bowel prep tolerated   Objective: Vital signs in last 24 hours: Temp:  [97.7 F (36.5 C)-98.6 F (37 C)] 98.6 F (37 C) (03/01 0619) Pulse Rate:  [79-90] 79 (03/01 0833) Resp:  [18] 18 (03/01 0619) BP: (114-133)/(50-83) 127/57 mmHg (03/01 0833) SpO2:  [98 %-100 %] 99 % (03/01 0619) Weight:  [81.103 kg (178 lb 12.8 oz)] 81.103 kg (178 lb 12.8 oz) (03/01 0619) Last BM Date: 01/13/16  Intake/Output from previous day: 02/28 0701 - 03/01 0700 In: 303.3 [I.V.:303.3] Out: 5 [Urine:2; Stool:3] Intake/Output this shift:    General appearance: alert and cooperative GI: soft, non-tender; bowel sounds normal; no masses,  no organomegaly  Lab Results:   Recent Labs  01/11/16 1150 01/13/16 0613  WBC  --  7.7  HGB 10.1* 9.2*  HCT 30.3* 29.0*  PLT  --  207   BMET  Recent Labs  01/13/16 0613  NA 141  K 3.6  CL 102  CO2 28  GLUCOSE 161*  BUN 28*  CREATININE 1.83*  CALCIUM 8.9   PT/INR  Recent Labs  01/13/16 0613  LABPROT 15.2  INR 1.18   Anti-infectives: Anti-infectives    Start     Dose/Rate Route Frequency Ordered Stop   01/13/16 0600  cefoTEtan (CEFOTAN) 2 g in dextrose 5 % 50 mL IVPB     2 g 100 mL/hr over 30 Minutes Intravenous On call to O.R. 01/12/16 1307 01/14/16 0559   01/12/16 1400  neomycin (MYCIFRADIN) tablet 1,000 mg     1,000 mg Oral 3 times per day 01/12/16 1307 01/12/16 2332   01/12/16 1400  erythromycin (E-MYCIN) tablet 1,000 mg     1,000 mg Oral 3 times per day 01/12/16 1307 01/12/16 2332      Assessment/Plan: s/p Procedure(s) with comments: COLONOSCOPY (Left) - no sedation to start, moderate if needed -To OR for L/sigmoid colectomy - I d/w the pt  The risks and benefits of the procedure to include and not limited to: infection, bleeding, damage to surrounding structures, possible ostomy, possible need for further surgery.  Pt voiced understanding and wishes to proceed.    LOS: 10  days    Rosario Jacks., [facility] 01/13/2016

## 2016-01-13 NOTE — Progress Notes (Signed)
PROGRESS NOTE  [name] [id number] DOB: August 17, 1932 DOA: 01/03/2016 PCP: [name], MD Outpatient Specialists:    LOS: 10 days   Brief Narrative: [name] is an 82 year old male who was admitted on 01/03/2016 with chest pain and BRBPR x 3-4 days. History of diverticular bleeding with surgical intervention. Patient underwent full evaluation by gastroenterology, he has ongoing bleeding, Gen. surgery was consulted and patient will be taken to the operating room on 3/1. Cardiology was consulted for preop clearance given ongoing chest pains.  Assessment & Plan: Principal Problem:   GI bleed Active Problems:   CKD (chronic kidney disease) stage 3, GFR 30-59 ml/min   Acute blood loss anemia   Acute on chronic diastolic (congestive) heart failure ([name])   Diabetes mellitus ([name])   Diastolic CHF ([name])   Atrial fibrillation ([name])   Chronic combined systolic and diastolic CHF (congestive heart failure) ([name])   Pulmonary hypertension ([name])   CKD (chronic kidney disease), stage III   History of GI diverticular bleed   Palliative care encounter   DNR (do not resuscitate) discussion   Rectal bleeding   CAD in native artery   Acute GI bleed/Acute blood loss anemia - Recurrent diverticular bleed despite cessation of Brilinta 5-6 weeks ago. - Colonoscopy 01/05/2016 revealed diverticulosis: severe in the left colon, moderate in the transverse colon and mild in the right colon. No evidence of active bleeding or old blood. - Nuclear RBC scan 01/10/2016 revealed increased activity in the left mid-abd, likely the distal transverse colon and proximal descending colon consistent with acute hemorrhage. - Transfuse for hemoglobin less than 8 - Cardiology cleared patient for surgery without further testing; patient is a high-risk surgical candidate - Suspected LGIB from diverticuli; Surgery will take patient to the operating room on 3/1 - received total 4U pRBC so far  CKD stage 3 - Appears  euvolemic. Cr stable. Follow.   Chronic combined systolic and diastolic CHF/Acute on chronic diastolic (congestive) heart failure - Appears euvolemic - Cardiology following  Chest pain with coronary artery disease as well as NSTEMI. - Troponins are negative. Chest pain improved with blood transfusion 01/10/2016. - Continuing metoprolol and statin. Low-dose aspirin given his coronary artery disease. - Continue nitrostat prn  Diabetes mellitus - HgbA1c: 6.0 on arrival - Continue SSI  History of Atrial fibrillation  - No anticoagulation due to recurrent GIB - Current NSR  Essential hypertension - Blood pressure well controlled.  - Continue metoprolol   DVT prophylaxis: SCDs Code Status: Full Family Communication: None at bedside Disposition Plan: Surgery today, 3/1  Barriers for discharge: LGIB  Consultants:   Gastroenterology  Cardiology  Surgery  Palliative   Procedures:   Colonoscopy 01/05/2016  Antimicrobials:  None   Subjective: - NAD, no chest pain, awaiting surgery  Objective: Filed Vitals:   01/12/16 1413 01/12/16 1700 01/12/16 2115 01/13/16 0619  BP: 114/83 122/66 133/56 133/50  Pulse: 90 87 88 88  Temp: 98.6 F (37 C) 97.7 F (36.5 C) 98.3 F (36.8 C) 98.6 F (37 C)  TempSrc: Oral Oral Oral Oral  Resp: 18 18 18 18   Height:      Weight:    81.103 kg (178 lb 12.8 oz)  SpO2: 98% 100% 100% 99%    Intake/Output Summary (Last 24 hours) at 01/13/16 0714 Last data filed at 01/13/16 0500  Gross per 24 hour  Intake 303.33 ml  Output      5 ml  Net 298.33 ml   [employer]  01/12/16 0500 01/12/16 0615 01/13/16 0619  Weight: 82.283 kg (181 lb 6.4 oz) 80.9 kg (178 lb 5.6 oz) 81.103 kg (178 lb 12.8 oz)   Examination: BP 133/50 mmHg  Pulse 88  Temp(Src) 98.6 F (37 C) (Oral)  Resp 18  Ht 5\' 9"  (1.753 m)  Wt 81.103 kg (178 lb 12.8 oz)  BMI 26.39 kg/m2  SpO2 99% General exam: NAD Respiratory system: Clear. No increased work of  breathing. No wheezing, no crackles. Cardiovascular system: regular rate and rhythm, 2/6 systolic murmur, no gallops. No JVD. No peripheral edema.  Gastrointestinal system: Abdomen is nondistended, soft and nontender. Normal bowel sounds heard. Central nervous system: AxOx3. No focal deficits. Extremities: No clubbing/cyanosis. Skin: No rashes.  Data Reviewed: I have personally reviewed following labs and imaging studies  CBC:  Recent Labs Lab 01/08/16 0514 01/09/16 0614 01/09/16 1812 01/10/16 0042 01/10/16 0634 01/11/16 1150  WBC 7.5 8.2 7.0 7.2 7.3  --   NEUTROABS  --   --   --  4.3 4.6  --   HGB 8.8* 8.9* 8.7* 8.0* 8.0* 10.1*  HCT 26.8* 28.1* 27.5* 25.3* 25.1* 30.3*  MCV 93.7 92.1 92.0 91.3 91.3  --   PLT 172 212 212 191 194  --    Basic Metabolic Panel:  Recent Labs Lab 01/07/16 0300 01/08/16 0514 01/09/16 0614  NA 142 138 142  K 4.2 3.7 3.7  CL 110 104 104  CO2 27 26 26   GLUCOSE 142* 151* 167*  BUN 17 19 23*  CREATININE 2.06* 1.91* 1.93*  CALCIUM 8.6* 8.8* 9.1   GFR: Estimated Creatinine Clearance: 30 mL/min (by C-G formula based on Cr of 1.93). Liver Function Tests:  Recent Labs Lab 01/07/16 0300 01/08/16 0514 01/09/16 0614  AST 21 20 23   ALT 19 18 20   ALKPHOS 60 56 60  BILITOT 0.3 0.7 0.7  PROT 5.6* 5.5* 6.2*  ALBUMIN 3.0* 2.9* 3.2*   No results for input(s): LIPASE, AMYLASE in the last 168 hours. No results for input(s): AMMONIA in the last 168 hours. Coagulation Profile: No results for input(s): INR, PROTIME in the last 168 hours. Cardiac Enzymes: No results for input(s): CKTOTAL, CKMB, CKMBINDEX, TROPONINI in the last 168 hours. BNP (last 3 results) No results for input(s): PROBNP in the last 8760 hours. HbA1C: No results for input(s): HGBA1C in the last 72 hours. CBG:  Recent Labs Lab 01/12/16 0800 01/12/16 1133 01/12/16 1731 01/12/16 2113 01/13/16 0603  GLUCAP 189* 164* 158* 150* 139*   Lipid Profile: No results for input(s):  CHOL, HDL, LDLCALC, TRIG, CHOLHDL, LDLDIRECT in the last 72 hours. Thyroid Function Tests: No results for input(s): TSH, T4TOTAL, FREET4, T3FREE, THYROIDAB in the last 72 hours. Anemia Panel: No results for input(s): VITAMINB12, FOLATE, FERRITIN, TIBC, IRON, RETICCTPCT in the last 72 hours. Urine analysis:    Component Value Date/Time   COLORURINE YELLOW 11/14/2015 [place] 11/14/2015 0241   LABSPEC 1.018 11/14/2015 0241   PHURINE 5.0 11/14/2015 0241   GLUCOSEU NEGATIVE 11/14/2015 0241   HGBUR NEGATIVE 11/14/2015 0241   HGBUR negative 09/12/2007 1028   BILIRUBINUR NEGATIVE 11/14/2015 0241   KETONESUR NEGATIVE 11/14/2015 0241   PROTEINUR NEGATIVE 11/14/2015 0241   UROBILINOGEN 0.2 06/14/2014 2210   NITRITE NEGATIVE 11/14/2015 0241   LEUKOCYTESUR NEGATIVE 11/14/2015 0241   Sepsis Labs: Invalid input(s): PROCALCITONIN, LACTICIDVEN  Recent Results (from the past 240 hour(s))  MRSA PCR Screening     Status: None   Collection Time: 01/03/16  5:57 PM  Result Value Ref Range Status   MRSA by PCR NEGATIVE NEGATIVE Final    Comment:        The GeneXpert MRSA Assay (FDA approved for NASAL specimens only), is one component of a comprehensive MRSA colonization surveillance program. It is not intended to diagnose MRSA infection nor to guide or monitor treatment for MRSA infections.       Radiology Studies: No results found.   Scheduled Meds: . sodium chloride   Intravenous Once  . aspirin EC  81 mg Oral Daily  . cefoTEtan (CEFOTAN) 2 GM IVPB  2 g Intravenous On Call to OR  . Chlorhexidine Gluconate Cloth  6 each Topical Once  . feeding supplement  1 Container Oral TID BM  . furosemide  80 mg Oral Daily  . insulin aspart  0-15 Units Subcutaneous TID WC  . insulin aspart  0-5 Units Subcutaneous QHS  . isosorbide mononitrate  60 mg Oral BID WC  . magnesium oxide  400 mg Oral BID  . metoprolol tartrate  12.5 mg Oral BID  . pantoprazole  40 mg Oral Q0600  .  rosuvastatin  40 mg Oral q1800   Continuous Infusions: . sodium chloride 50 mL/hr at 01/12/16 2256  None.   Marzetta Board, MD, PhD [facility] Pager [phone]  If 7PM-7AM, please contact night-coverage [email or web address] Password [email or web address] 01/13/2016, 7:14 AM

## 2016-01-13 NOTE — Progress Notes (Signed)
Patient is on a PCA pump and has a NG tube right now. Patient is unable to wear CPAP tonight. Patient is tolerating [place] 3L well. RT will continue to monitor as needed.

## 2016-01-13 NOTE — Anesthesia Preprocedure Evaluation (Signed)
Anesthesia Evaluation  Patient identified by MRN, date of birth, ID band Patient awake    Reviewed: Allergy & Precautions, NPO status , Patient's Chart, lab work & pertinent test results  Airway Mallampati: II  TM Distance: >3 FB     Dental   Pulmonary sleep apnea , pneumonia, former smoker,    breath sounds clear to auscultation       Cardiovascular hypertension, + angina + CAD, + Past MI, + Peripheral Vascular Disease and +CHF  + dysrhythmias  Rhythm:Regular Rate:Normal     Neuro/Psych  Neuromuscular disease    GI/Hepatic GERD  ,  Endo/Other  diabetes  Renal/GU Renal disease     Musculoskeletal   Abdominal   Peds  Hematology   Anesthesia Other Findings   Reproductive/Obstetrics                             Anesthesia Physical Anesthesia Plan  ASA: III  Anesthesia Plan: General   Post-op Pain Management:    Induction: Intravenous  Airway Management Planned: Oral ETT  Additional Equipment:   Intra-op Plan:   Post-operative Plan: Extubation in OR  Informed Consent: I have reviewed the patients History and Physical, chart, labs and discussed the procedure including the risks, benefits and alternatives for the proposed anesthesia with the patient or authorized representative who has indicated his/her understanding and acceptance.   Dental advisory given  Plan Discussed with: CRNA and Anesthesiologist  Anesthesia Plan Comments:         Anesthesia Quick Evaluation

## 2016-01-13 NOTE — Op Note (Signed)
01/13/2016  1:53 PM  PATIENT:  Bradly Bienenstock Palmer  82 y.o. male  PRE-OPERATIVE DIAGNOSIS:  Recurrent lower GI bleed   POST-OPERATIVE DIAGNOSIS:  Recurrent lower GI bleed   PROCEDURE:  Procedure(s) with comments: EXPLORATORY LAPAROTOMY,  SPLENIC FLEXURE MOBILIZATION, LEFT AND SIGMOID COLON REMOVAL (N/A) - Extended open left hemicolectomy and sigmoidectomy    SURGEON:  Surgeon(s) and Role:    * Ralene Ok, MD - Primary  ANESTHESIA:   general  EBL:  Total I/O In: 500 [I.V.:500] Out: 615 [Urine:525; Blood:90]  BLOOD ADMINISTERED:none  DRAINS: none   LOCAL MEDICATIONS USED:  NONE  SPECIMEN:  Source of Specimen:  LEFT COLON AND SIGMOID COLON  DISPOSITION OF SPECIMEN:  PATHOLOGY  COUNTS:  YES  TOURNIQUET:  * No tourniquets in log *  DICTATION: .Dragon Dictation  Indication: Patient is an 82 year old male with a recurrent lower GI bleed bleed. Patient underwent endoscopic studies which revealed a left-sided diverticuli an area of bleeding. This areas were clipped. Patient also intact with blood cell scan which resulted in a left sided/splenic flexure area collection. Secondary to ongoing bleeding patient was taken back to the OR after consent for a extended left and sigmoid colectomy.  Details of procedure: After the patient was consented he was taken back to the operating room and placed in the supine position with bilateral testes in place. Gen. endotracheal anesthesia. Foley catheter was placed. The patient was in position in the lithotomy position. The patient was prepped and draped in the usual sterile fashion. Timeout was called and all facts verified.  A midline incision was made using a #10 blade. Electrocautery was used to maintain hemostasis and dissection was taken down to the midline fascia. This was incised. This was elevated. The peritoneum was entered bluntly. At this time the fascia was extended to the length of the skin incision. A Bookwalter retractor was in  placed to help with abdominal wall retraction. At this time it was evident there was extensive diverticuli to the left and sigmoid colon. At this time the white line of Toldt was incised to the lateral abdominal wall. This help medialize the left sigmoid colon. This was taken down to the rectum. The rectum was identified by the confluence of the tinea coli. The proceeded to continue dissection of the left colon proximally to the splenic flexure. The left and transverse colon were very redundant and the possible flexure was taken down from a proximal and a distal direction. Once this was done and the colon was able to be medialized teaching ureter was visualized. This was protected.   At this time an area of the rectum was chosen. The mesentery was incised. This was taken up to the wall of the rectum. At this time a blue contour was used to transect the rectum. This allowed me to retract the rectum and sigmoid colon out of the wound. A LigaSure device was used to transect the mesentery. The mesentery was ligated close to the colonic wall. This was taken up to the mid transverse colon. The omentum was taken off of the transverse colon using the LigaSure device. The middle colic vessels were visualized and the transection area of the transverse colon was chosen just distal to the left branch of the middle colic vessel.  At this time the specimen was sent to pathology.  At this time I was able to mobilize the transverse colon down to the rectum which easily reached. There was no tension this area. At this time the  rectal staple line was removed using electrocautery. Sizers were used and a 33 EEA stapler was chosen for the anastomosis. A 0 Prolene was used to create a pursestring and anvil was secured.  At this time a colotomy was made just proximal to the proximal staple line of the transverse colon. This was done along the tinea. At this time the EEA stapler was then brought through this purposely created  colotomy. This was advanced the staple line. The spike was brought up through the middle of the staple line. At this time the anvil and spike were brought together joint. The staple line was then brought down to the maximal limit. This was fired. This was removed from the lumen of the anastomosis.  At this time a 3-0 Vicryl was used to reapproximate the this was done in a standard double layer fashion. At this time rigid proctoscopy was done. The abdomen was filled with saline. The proximal bowel clamp was placed on the colon proximal to the anastomosis. Insufflation was initiated from the proctoscope. There is no bubbles and there was a negative bubble leak. At this time the insufflation was evacuated.  At this time the area was irrigated out with sterile saline. The colon protocol and gown, gloves, drapes were changed. At this time the mesentery was in situ down to the lateral abdominal wall using a 3-0 silks in interrupted fashion. At this time the bowel was replaced in standard fashion. A #1 PDS was used to reapproximate the fascia in a standard running fashion 2. The skin was loosely stapled after irrigation.  The honeycomb dressing was placed over the incision site.  The patient tolerated the procedure well was taken to the PACU in stable condition.  PLAN OF CARE: Admit to inpatient   PATIENT DISPOSITION:  PACU - hemodynamically stable.   Delay start of Pharmacological VTE agent (>24hrs) due to surgical blood loss or risk of bleeding: yes

## 2016-01-13 NOTE — Addendum Note (Signed)
Addendum  created 01/13/16 1517 by Finis Bud, MD   Modules edited: Orders, PRL Based Order Sets

## 2016-01-13 NOTE — Transfer of Care (Signed)
Immediate Anesthesia Transfer of Care Note  Patient: [name]  Procedure(s) Performed: Procedure(s) with comments: EXPLORATORY LAPAROTOMY, LEFT AND SIGMOID COLON REMOVAL (N/A) - Extended open left hemicolectomy and sigmoidectomy   Patient Location: PACU  Anesthesia Type:General  Level of Consciousness: awake, alert , oriented and patient cooperative  Airway & Oxygen Therapy: Patient Spontanous Breathing and Patient connected to face mask oxygen  Post-op Assessment: Report given to RN and Post -op Vital signs reviewed and stable  Post vital signs: Reviewed and stable  Last Vitals:  Filed Vitals:   01/13/16 0832 01/13/16 0833  BP: 127/57 127/57  Pulse:  79  Temp:    Resp:      Complications: No apparent anesthesia complications

## 2016-01-13 NOTE — Anesthesia Postprocedure Evaluation (Signed)
Anesthesia Post Note  Patient: [name]  Procedure(s) Performed: Procedure(s) (LRB): EXPLORATORY LAPAROTOMY, LEFT AND SIGMOID COLON REMOVAL (N/A)  Patient location during evaluation: PACU Anesthesia Type: General Level of consciousness: awake Pain management: pain level controlled Vital Signs Assessment: post-procedure vital signs reviewed and stable Respiratory status: spontaneous breathing Cardiovascular status: stable Anesthetic complications: no    Last Vitals:  Filed Vitals:   01/13/16 1404 01/13/16 1415  BP: 174/70   Pulse: 67 66  Temp: 36.4 C   Resp: 15 10    Last Pain:  Filed Vitals:   01/13/16 1417  PainSc: 0-No pain                 EDWARDS,Tinya Cadogan

## 2016-01-14 ENCOUNTER — Encounter (HOSPITAL_COMMUNITY): Payer: Self-pay | Admitting: General Surgery

## 2016-01-14 DIAGNOSIS — K254 Chronic or unspecified gastric ulcer with hemorrhage: Secondary | ICD-10-CM

## 2016-01-14 LAB — BASIC METABOLIC PANEL
Anion gap: 9 (ref 5–15)
BUN: 23 mg/dL — ABNORMAL HIGH (ref 6–20)
CO2: 24 mmol/L (ref 22–32)
Calcium: 8.4 mg/dL — ABNORMAL LOW (ref 8.9–10.3)
Chloride: 108 mmol/L (ref 101–111)
Creatinine, Ser: 2.05 mg/dL — ABNORMAL HIGH (ref 0.61–1.24)
GFR calc Af Amer: 33 mL/min — ABNORMAL LOW (ref >60)
GFR calc non Af Amer: 29 mL/min — ABNORMAL LOW (ref >60)
Glucose, Bld: 262 mg/dL — ABNORMAL HIGH (ref 65–99)
Potassium: 3.6 mmol/L (ref 3.5–5.1)
Sodium: 141 mmol/L (ref 135–145)

## 2016-01-14 LAB — GLUCOSE, CAPILLARY
GLUCOSE-CAPILLARY: 162 mg/dL — AB (ref 65–99)
GLUCOSE-CAPILLARY: 143 mg/dL — AB (ref 65–99)
Glucose-Capillary: 236 mg/dL — ABNORMAL HIGH (ref 65–99)
Glucose-Capillary: 243 mg/dL — ABNORMAL HIGH (ref 65–99)

## 2016-01-14 LAB — CBC
HCT: 25.5 % — ABNORMAL LOW (ref 39.0–52.0)
HCT: 27.5 % — ABNORMAL LOW (ref 39.0–52.0)
Hemoglobin: 8.2 g/dL — ABNORMAL LOW (ref 13.0–17.0)
Hemoglobin: 8.6 g/dL — ABNORMAL LOW (ref 13.0–17.0)
MCH: 29 pg (ref 26.0–34.0)
MCH: 28.7 pg (ref 26.0–34.0)
MCHC: 32.2 g/dL (ref 30.0–36.0)
MCHC: 31.3 g/dL (ref 30.0–36.0)
MCV: 90.1 fL (ref 78.0–100.0)
MCV: 91.7 fL (ref 78.0–100.0)
Platelets: 199 10*3/uL (ref 150–400)
Platelets: 204 10*3/uL (ref 150–400)
RBC: 2.83 MIL/uL — ABNORMAL LOW (ref 4.22–5.81)
RBC: 3 MIL/uL — ABNORMAL LOW (ref 4.22–5.81)
RDW: 14.2 % (ref 11.5–15.5)
RDW: 14.6 % (ref 11.5–15.5)
WBC: 13.6 10*3/uL — ABNORMAL HIGH (ref 4.0–10.5)
WBC: 17.1 10*3/uL — ABNORMAL HIGH (ref 4.0–10.5)

## 2016-01-14 LAB — MAGNESIUM: Magnesium: 1.9 mg/dL (ref 1.7–2.4)

## 2016-01-14 MED ORDER — ACETAMINOPHEN 10 MG/ML IV SOLN
1000.0000 mg | Freq: Four times a day (QID) | INTRAVENOUS | Status: AC
  Administered 2016-01-14 – 2016-01-15 (×4): 1000 mg via INTRAVENOUS
  Filled 2016-01-14 (×5): qty 100

## 2016-01-14 NOTE — Progress Notes (Addendum)
Paged Cards MD on call regarding pt now d/o CP.Pressure Pain 6; BP 160s/70s. HR ST 108. Gave 2 nitro. Pain 2-3. BP 150s/70s. HR 89 SR. EKG done.

## 2016-01-14 NOTE — Progress Notes (Signed)
1 Day Post-Op  Subjective: He looks pretty good this AM up in the chair.  Say it hurt quite a bit to get up.  Not looking forward to daily weight.  NG working just not recorded.    Objective: Vital signs in last 24 hours: Temp:  [97.5 F (36.4 C)-99.1 F (37.3 C)] 97.8 F (36.6 C) (03/02 0751) Pulse Rate:  [66-98] 93 (03/02 0650) Resp:  [10-22] 16 (03/02 0650) BP: (112-179)/(50-78) 112/68 mmHg (03/02 0650) SpO2:  [97 %-100 %] 100 % (03/02 0650) Arterial Line BP: (73-217)/(44-70) 73/70 mmHg (03/02 0650) Weight:  [81 kg (178 lb 9.2 oz)] 81 kg (178 lb 9.2 oz) (03/02 0751) Last BM Date: 01/13/16 NPO 1910 IV fluids Urine 1600 NG not recorded Creatinine is up to 2.05 Glucose 262 WBC up some  H/H 8.2/25 Afebrile, the RR was down last PM but seems better after that Intake/Output from previous day: 03/01 0701 - 03/02 0700 In: 2170.3 [I.V.:1910.3; NG/GT:60; IV Piggyback:200] Out: [id number] [Urine:1600; Blood:90] Intake/Output this shift:    General appearance: alert, cooperative and no distress Resp: clear to auscultation bilaterally and I got him to do the IS and he got well over 1000 ml.   Cardio: Looks like sinus, but I'm not sure. GI: no BS, dressing looks fine.  Lab Results:   Recent Labs  01/13/16 0613 01/14/16 0435  WBC 7.7 13.6*  HGB 9.2* 8.2*  HCT 29.0* 25.5*  PLT 207 199    BMET  Recent Labs  01/13/16 0613 01/14/16 0435  NA 141 141  K 3.6 3.6  CL 102 108  CO2 28 24  GLUCOSE 161* 262*  BUN 28* 23*  CREATININE 1.83* 2.05*  CALCIUM 8.9 8.4*   PT/INR  Recent Labs  01/13/16 0613  LABPROT 15.2  INR 1.18     Recent Labs Lab 01/08/16 0514 01/09/16 0614  AST 20 23  ALT 18 20  ALKPHOS 56 60  BILITOT 0.7 0.7  PROT 5.5* 6.2*  ALBUMIN 2.9* 3.2*     Lipase     Component Value Date/Time   LIPASE 29 09/01/2008 2100     Studies/Results: No results found.  Medications: . acetaminophen  1,000 mg Intravenous 4 times per day  . alvimopan  12 mg  Oral Once  . [START ON 01/16/2016] aspirin EC  81 mg Oral Daily  . cefoTEtan (CEFOTAN) 2 GM IVPB  2 g Intravenous Once  . HYDROmorphone   Intravenous 6 times per day  . insulin aspart  0-15 Units Subcutaneous TID WC  . insulin aspart  0-5 Units Subcutaneous QHS  . isosorbide mononitrate  60 mg Oral BID WC  . metoprolol  2.5 mg Intravenous 4 times per day  . pantoprazole (PROTONIX) IV  40 mg Intravenous Q12H   . sodium chloride 75 mL/hr at 01/13/16 2300    Assessment/Plan Recurrent lower GI Bleed EXPLORATORY LAPAROTOMY,  SPLENIC FLEXURE MOBILIZATION, LEFT AND SIGMOID COLON REMOVAL (N/A) - Extended open left hemicolectomy and sigmoidectomy, 01/13/16, Dr. Ralene Ok Chest pin with NSTEMI [id number] Hx of systolic/diastolic CHF Atrial fibrillation AODM Hypertension Chronic kidney disease, Stage III Antibiotics: None DVT: SCD    Plan:  He is up to the chair and we will start mobilizing him today.  I will leave the foley in but pull the aline.  Encourage IS, continue NG.  Continue IV tylenol.  I have his IV at 75 ml/hr.  I will let medicine adjust if they think we are safe to give more fluids.  Appreciate Cardiology and Medicine input on fluids.  If his H/H is stable consider Heparin for DVT tomorrow.  LOS: 11 days    Dwanna Goshert 01/14/2016

## 2016-01-14 NOTE — Progress Notes (Signed)
Utilization review completed.  

## 2016-01-14 NOTE — Progress Notes (Signed)
Patient Name: [name] Date of Encounter: 01/14/2016  Primary Cardiologist: Dr. Gwenlyn Found (formerly Dr. [name])   Principal Problem:   GI bleed Active Problems:   CKD (chronic kidney disease) stage 3, GFR 30-59 ml/min   Acute blood loss anemia   Acute on chronic diastolic (congestive) heart failure ([name])   Diabetes mellitus ([name])   Diastolic CHF ([name])   Atrial fibrillation ([name])   Chronic combined systolic and diastolic CHF (congestive heart failure) ([name])   Pulmonary hypertension ([name])   CKD (chronic kidney disease), stage III   History of GI diverticular bleed   Palliative care encounter   DNR (do not resuscitate) discussion   Rectal bleeding   CAD in native artery    SUBJECTIVE  Denies any CP or SOB overnight.   CURRENT MEDS . acetaminophen  1,000 mg Intravenous 4 times per day  . alvimopan  12 mg Oral BID  . [START ON 01/16/2016] aspirin EC  81 mg Oral Daily  . cefoTEtan (CEFOTAN) 2 GM IVPB  2 g Intravenous Once  . HYDROmorphone   Intravenous 6 times per day  . insulin aspart  0-15 Units Subcutaneous TID WC  . insulin aspart  0-5 Units Subcutaneous QHS  . isosorbide mononitrate  60 mg Oral BID WC  . metoprolol  2.5 mg Intravenous 4 times per day  . pantoprazole (PROTONIX) IV  40 mg Intravenous Q12H    OBJECTIVE  Filed Vitals:   01/14/16 0422 01/14/16 0650 01/14/16 0751 01/14/16 0805  BP: 117/67 112/68 127/59   Pulse: 98 93    Temp: 98.6 F (37 C)  97.8 F (36.6 C)   TempSrc: Oral  Oral   Resp: 15 16  12   Height:      Weight:   178 lb 9.2 oz (81 kg)   SpO2: 100% 100%  95%    Intake/Output Summary (Last 24 hours) at 01/14/16 0919 Last data filed at 01/14/16 0600  Gross per 24 hour  Intake 2170.25 ml  Output   1690 ml  Net 480.25 ml   Filed Weights   01/12/16 0615 01/13/16 0619 01/14/16 0751  Weight: 178 lb 5.6 oz (80.9 kg) 178 lb 12.8 oz (81.103 kg) 178 lb 9.2 oz (81 kg)    PHYSICAL EXAM  General: Pleasant, NAD. NG tube in place. Neuro: Alert  and oriented X 3. Moves all extremities spontaneously. Psych: Normal affect. HEENT:  Normal  Neck: Supple without bruits or JVD. Lungs:  Resp regular and unlabored, CTA. Heart: RRR no s3, s4, or murmurs. Abdomen: Midline incision noted, covered with clear dressing, no obvious active bleeding, some dried blood. No bowel sound Extremities: No clubbing, cyanosis or edema. DP/PT/Radials 2+ and equal bilaterally.  Accessory Clinical Findings  CBC  Recent Labs  01/13/16 0613 01/14/16 0435  WBC 7.7 13.6*  HGB 9.2* 8.2*  HCT 29.0* 25.5*  MCV 90.9 90.1  PLT 207 199    TELE NSR without significant ventricular ectopy    ECG  No new EKG  Echocardiogram 11/17/2015  LV EF: 50% -  55%  ------------------------------------------------------------------- Indications:   Fever 780.6.  ------------------------------------------------------------------- History:  PMH: NSTEMI. Chest pain. Dyspnea. Coronary artery disease. Risk factors: Diabetes mellitus.  ------------------------------------------------------------------- Study Conclusions  - Left ventricle: There is hypokinesis of the mid anterolateral , anterior and distal lateral and anterior walls. The cavity size was normal. Systolic function was normal. The estimated ejection fraction was in the range of 50% to 55%. Wall motion was normal; there were no  regional wall motion abnormalities. Features are consistent with a pseudonormal left ventricular filling pattern, with concomitant abnormal relaxation and increased filling pressure (grade 2 diastolic dysfunction). Doppler parameters are consistent with elevated ventricular end-diastolic filling pressure. - Aortic root: The aortic root was normal in size. - Mitral valve: There was mild to moderate regurgitation. - Left atrium: The atrium was moderately dilated. - Right ventricle: Systolic function was normal. - Tricuspid valve: Structurally normal  valve. There was moderate regurgitation. - Pulmonic valve: There was no regurgitation. - Pulmonary arteries: Systolic pressure was moderately increased. PA peak pressure: 51 mm Hg (S). - Inferior vena cava: The vessel was normal in size. - Pericardium, extracardiac: There was no pericardial effusion.    Radiology/Studies  Dg Chest 2 View  01/03/2016  CLINICAL DATA:  82 year old male with chest pain EXAM: CHEST  2 VIEW COMPARISON:  Radiograph dated 12/18/2015 FINDINGS: Two views of the chest demonstrate stable minimal left lung base linear atelectasis. There is no focal consolidation, pleural effusion, or pneumothorax. Stable cardiac silhouette. Median sternotomy wires and CABG vascular clips. No acute osseous pathology. IMPRESSION: No active cardiopulmonary disease. Electronically Signed   By: Anner Crete M.D.   On: 01/03/2016 03:02   Dg Chest 2 View  12/18/2015  CLINICAL DATA:  Pneumonia. EXAM: CHEST  2 VIEW COMPARISON:  November 17, 2015. FINDINGS: Stable cardiomediastinal silhouette. Status post coronary artery bypass graft. No pneumothorax or effusion is noted. Multilevel degenerative disc disease is noted in mid thoracic spine. Atherosclerosis thoracic aorta is noted. Right lung is clear. Mild left basilar subsegmental atelectasis is noted. IMPRESSION: Mild left basilar subsegmental atelectasis. Electronically Signed   By: Marijo Conception, M.D.   On: 12/18/2015 13:12   Nm Gi Blood Loss  01/10/2016  CLINICAL DATA:  GI hemorrhage and rectal bleeding EXAM: NUCLEAR MEDICINE GASTROINTESTINAL BLEEDING SCAN TECHNIQUE: Sequential abdominal images were obtained following intravenous administration of Tc-71m labeled red blood cells. RADIOPHARMACEUTICALS:  25.1 mCi Tc-8m in-vitro labeled red cells. COMPARISON:  01/03/2016 FINDINGS: There are now areas of increased activity identified in the left mid abdomen which demonstrate mobility and likely lie within the distal transverse colon and proximal  descending colon consistent with acute hemorrhage. No other focal area of hemorrhage is seen. IMPRESSION: Areas of increased activity in the far lateral left abdomen likely related to the distal transverse and proximal descending colon. Electronically Signed   By: Inez Catalina M.D.   On: 01/10/2016 16:30   Nm Gi Blood Loss  01/03/2016  CLINICAL DATA:  History of GI diverticular bleed. GI bleeding. Dark blood in the stools noted 4 days ago. EXAM: NUCLEAR MEDICINE GASTROINTESTINAL BLEEDING SCAN TECHNIQUE: Sequential abdominal images were obtained following intravenous administration of Tc-9m labeled red blood cells. RADIOPHARMACEUTICALS:  25.0 mCi Tc-74m in-vitro labeled red cells. COMPARISON:  09/13/2008 FINDINGS: Early level activity is identified. Delayed images are performed, demonstrating no site of active bleeding. IMPRESSION: A site of active bleeding is not identified. Electronically Signed   By: Nolon Nations M.D.   On: 01/03/2016 16:27    ASSESSMENT AND PLAN  Mr. Colas is a pleasant 82 year old Caucasian male with past medical history of severe diverticulosis, multiple GI bleed since 1981, carotid artery disease s/p R CEA by [name] '94, HTN, HLD, DM, CKD stage IV, CAD s/p CABG 1996 with redo 2006 by [name], and h/o PAF not on coumadin due to recurrent GI bleed presented with recurrent GI bleed after recent NSTEMI on medical management. Cardiology consulted for preop clearance.  1. Preop clearance for recurrent GI bleeding and severe pancolonic diverticulosis - Patient still has exertional angina, although current anemia can contribute to angina, he likely also has significant underlying coronary artery disease given persistent ST depression in the lateral leads. He also has anterior wall motion abnormality seen on recent echocardiogram despite having a stable EF of 50-55%. - Patient is higher than normal risk for surgical procedure. Unable to take him to  the cath lab and put in stent at this time due to active GI bleed and stage IV CKD.  - stable after surgery this morning, denies any CP or SOB. NG tube still in place, no bowel sound. Appears to be hemodynamically stable without obvious post surgery cardiac complication.  2. Acute GI bleed with multiple GI bleed since 1981, likely related to severe diverticulosis - colonoscopy on 2/21 worse diverticulosis on L compare to R, no active bleeding - RBC tagged scan on 2/26 increased activity identified on L abdomen likely represent acute hemorrhage  - s/p open L hemicolectomy and sigmoidectomy 01/13/2016   3. CAD s/p CABG 1996 with redo 2006 by [name]  4. CKD stage IV  5. carotid artery disease s/p R CEA by [name] '94  6. HTN 7. HLD 8. DM 9. h/o PAF not on coumadin due to recurrent GI bleed  Signed, Almyra Deforest PA-C Pager: [phone]   Patient seen and examined. Agree with assessment and plan. Day 1 s/p left and sigmoid colon removal with extended open left hemicolectomy and sigmoidectomy 01/13/16 by  Dr. Ralene Ok.  No angina or dyspnea. Will obtain post op ECG today.   Troy Sine, MD, [facility] 01/14/2016 12:14 PM

## 2016-01-14 NOTE — Progress Notes (Signed)
Inpatient Diabetes Program Recommendations  AACE/ADA: New Consensus Statement on Inpatient Glycemic Control (2015)  Target Ranges:  Prepandial:   less than 140 mg/dL      Peak postprandial:   less than 180 mg/dL (1-2 hours)      Critically ill patients:  140 - 180 mg/dL   Review of Glycemic Control  Results for MOISE, MALZAHN (MRN [id number]) as of 01/14/2016 13:24  Ref. Range 01/13/2016 14:09 01/13/2016 21:14 01/14/2016 07:47 01/14/2016 11:53  Glucose-Capillary Latest Ref Range: 65-99 mg/dL 175 (H) 284 (H) 236 (H) 243 (H)   NPO. Hyperglycemia.  Inpatient Diabetes Program Recommendations:    Change Novolog moderate to Q4H. May need small amount of basal insulin - Lantus 8 units QHS (1/2 home dose)   Will continue to follow.  Thank you. Lorenda Peck, RD, LDN, CDE Inpatient Diabetes Coordinator [phone]

## 2016-01-14 NOTE — Progress Notes (Addendum)
PROGRESS NOTE  [name] [id number] DOB: Mar 03, 1932 DOA: 01/03/2016 PCP: [name], MD Outpatient Specialists:    LOS: 11 days   Brief Narrative: [name] is an 82 year old male who was admitted on 01/03/2016 with chest pain and BRBPR x 3-4 days. History of diverticular bleeding with surgical intervention. Patient underwent full evaluation by gastroenterology, he has ongoing bleeding, Gen. surgery was consulted and patient will be taken to the operating room on 3/1. Cardiology was consulted for preop clearance given ongoing chest pains.  Assessment & Plan: Principal Problem:   GI bleed Active Problems:   CKD (chronic kidney disease) stage 3, GFR 30-59 ml/min   Acute blood loss anemia   Acute on chronic diastolic (congestive) heart failure ([name])   Diabetes mellitus ([name])   Diastolic CHF ([name])   Atrial fibrillation ([name])   Chronic combined systolic and diastolic CHF (congestive heart failure) ([name])   Pulmonary hypertension ([name])   CKD (chronic kidney disease), stage III   History of GI diverticular bleed   Palliative care encounter   DNR (do not resuscitate) discussion   Rectal bleeding   CAD in native artery   Acute GI bleed/Acute blood loss anemia - Recurrent diverticular bleed despite cessation of Brilinta 5-6 weeks ago. - Colonoscopy 01/05/2016 revealed diverticulosis: severe in the left colon, moderate in the transverse colon and mild in the right colon. No evidence of active bleeding or old blood. - Nuclear RBC scan 01/10/2016 revealed increased activity in the left mid-abd, likely the distal transverse colon and proximal descending colon consistent with acute hemorrhage. - Transfuse for hemoglobin less than 8 - Cardiology cleared patient for surgery without further testing; patient is a high-risk surgical candidate - Suspected LGIB from diverticuli;open left hemicolectomy and sigmoidectomy  3/1 - received total 4U pRBC so far NGT out in AM Hemoglobin change  from 9.2> 8.3, follow CBC closely  CKD stage 3 - Appears euvolemic. Cr stable. Follow.   Chronic combined systolic and diastolic CHF/Acute on chronic diastolic (congestive) heart failure - Appears euvolemic - Cardiology following  Chest pain with coronary artery disease as well as NSTEMI. - Troponins are negative. Chest pain improved with blood transfusion 01/10/2016.higher than normal risk for surgical procedure per cardiology  - Continuing metoprolol and statin. Low-dose aspirin given his coronary artery disease. - Continue nitrostat prn stable after surgery this morning, denies any CP or SOB s/p CABG 1996 with redo 2006 by [name]   Diabetes mellitus - HgbA1c: 6.0 on arrival - Continue SSI  History of Atrial fibrillation  - No anticoagulation due to recurrent GIB - Current NSR  Essential hypertension - Blood pressure well controlled.  - Continue metoprolol   carotid artery disease s/p R CEA by [name] '94  DVT prophylaxis: SCDs Code Status: Full Family Communication: None at bedside Disposition Plan: Surgery 3/1 , PT eval, disposition per general surgery  Barriers for discharge: LGIB  Consultants:   Gastroenterology  Cardiology  Surgery  Palliative   Procedures:   Colonoscopy 01/05/2016  Antimicrobials:  None   Subjective: Complaining of abdominal pain, has an NG tube in place   Objective: Filed Vitals:   01/14/16 0751 01/14/16 0805 01/14/16 1114 01/14/16 1224  BP: 127/59  120/59   Pulse:   80   Temp: 97.8 F (36.6 C)  97.8 F (36.6 C)   TempSrc: Oral  Axillary   Resp:  12 17 12   Height:      Weight: 81 kg (178 lb 9.2 oz)  SpO2:  95% 96% 100%    Intake/Output Summary (Last 24 hours) at 01/14/16 1237 Last data filed at 01/14/16 1115  Gross per 24 hour  Intake   2164 ml  Output   1640 ml  Net    524 ml   Filed Weights   01/12/16 0615 01/13/16 0619 01/14/16 0751  Weight: 80.9 kg (178 lb 5.6 oz) 81.103 kg (178 lb 12.8 oz) 81  kg (178 lb 9.2 oz)   Examination: BP 120/59 mmHg  Pulse 80  Temp(Src) 97.8 F (36.6 C) (Axillary)  Resp 12  Ht 5\' 9"  (1.753 m)  Wt 81 kg (178 lb 9.2 oz)  BMI 26.36 kg/m2  SpO2 100% General exam: NAD Respiratory system: Clear. No increased work of breathing. No wheezing, no crackles. Cardiovascular system: regular rate and rhythm, 2/6 systolic murmur, no gallops. No JVD. No peripheral edema.  Gastrointestinal system: Abdomen is nondistended, soft and nontender. Normal bowel sounds heard. Central nervous system: AxOx3. No focal deficits. Extremities: No clubbing/cyanosis. Skin: No rashes.  Data Reviewed: I have personally reviewed following labs and imaging studies  CBC:  Recent Labs Lab 01/09/16 1812 01/10/16 0042 01/10/16 0634 01/11/16 1150 01/13/16 0613 01/14/16 0435  WBC 7.0 7.2 7.3  --  7.7 13.6*  NEUTROABS  --  4.3 4.6  --   --   --   HGB 8.7* 8.0* 8.0* 10.1* 9.2* 8.2*  HCT 27.5* 25.3* 25.1* 30.3* 29.0* 25.5*  MCV 92.0 91.3 91.3  --  90.9 90.1  PLT 212 191 194  --  207 [id number]   Basic Metabolic Panel:  Recent Labs Lab 01/08/16 0514 01/09/16 0614 01/13/16 0613 01/14/16 0435  NA 138 142 141 141  K 3.7 3.7 3.6 3.6  CL 104 104 102 108  CO2 26 26 28 24   GLUCOSE 151* 167* 161* 262*  BUN 19 23* 28* 23*  CREATININE 1.91* 1.93* 1.83* 2.05*  CALCIUM 8.8* 9.1 8.9 8.4*  MG  --   --  2.1 1.9   GFR: Estimated Creatinine Clearance: 28.3 mL/min (by C-G formula based on Cr of 2.05). Liver Function Tests:  Recent Labs Lab 01/08/16 0514 01/09/16 0614  AST 20 23  ALT 18 20  ALKPHOS 56 60  BILITOT 0.7 0.7  PROT 5.5* 6.2*  ALBUMIN 2.9* 3.2*   No results for input(s): LIPASE, AMYLASE in the last 168 hours. No results for input(s): AMMONIA in the last 168 hours. Coagulation Profile:  Recent Labs Lab 01/13/16 0613  INR 1.18   Cardiac Enzymes: No results for input(s): CKTOTAL, CKMB, CKMBINDEX, TROPONINI in the last 168 hours. BNP (last 3 results) No results  for input(s): PROBNP in the last 8760 hours. HbA1C: No results for input(s): HGBA1C in the last 72 hours. CBG:  Recent Labs Lab 01/13/16 1021 01/13/16 1409 01/13/16 2114 01/14/16 0747 01/14/16 1153  GLUCAP 154* 175* 284* 236* 243*   Lipid Profile: No results for input(s): CHOL, HDL, LDLCALC, TRIG, CHOLHDL, LDLDIRECT in the last 72 hours. Thyroid Function Tests: No results for input(s): TSH, T4TOTAL, FREET4, T3FREE, THYROIDAB in the last 72 hours. Anemia Panel: No results for input(s): VITAMINB12, FOLATE, FERRITIN, TIBC, IRON, RETICCTPCT in the last 72 hours. Urine analysis:    Component Value Date/Time   COLORURINE YELLOW 11/14/2015 [place] 11/14/2015 0241   LABSPEC 1.018 11/14/2015 0241   PHURINE 5.0 11/14/2015 0241   GLUCOSEU NEGATIVE 11/14/2015 0241   HGBUR NEGATIVE 11/14/2015 0241   HGBUR negative 09/12/2007 1028   BILIRUBINUR NEGATIVE  11/14/2015 [place] 11/14/2015 0241   PROTEINUR NEGATIVE 11/14/2015 0241   UROBILINOGEN 0.2 06/14/2014 2210   NITRITE NEGATIVE 11/14/2015 0241   LEUKOCYTESUR NEGATIVE 11/14/2015 0241   Sepsis Labs: Invalid input(s): PROCALCITONIN, LACTICIDVEN  Recent Results (from the past 240 hour(s))  Surgical pcr screen     Status: None   Collection Time: 01/13/16  6:19 AM  Result Value Ref Range Status   MRSA, PCR NEGATIVE NEGATIVE Final   Staphylococcus aureus NEGATIVE NEGATIVE Final    Comment:        The Xpert SA Assay (FDA approved for NASAL specimens in patients over 40 years of age), is one component of a comprehensive surveillance program.  Test performance has been validated by [facility] for patients greater than or equal to 39 year old. It is not intended to diagnose infection nor to guide or monitor treatment.       Radiology Studies: No results found.   Scheduled Meds: . acetaminophen  1,000 mg Intravenous 4 times per day  . alvimopan  12 mg Oral BID  . [START ON 01/16/2016]  aspirin EC  81 mg Oral Daily  . HYDROmorphone   Intravenous 6 times per day  . insulin aspart  0-15 Units Subcutaneous TID WC  . insulin aspart  0-5 Units Subcutaneous QHS  . isosorbide mononitrate  60 mg Oral BID WC  . metoprolol  2.5 mg Intravenous 4 times per day  . pantoprazole (PROTONIX) IV  40 mg Intravenous Q12H   Continuous Infusions: . sodium chloride 75 mL/hr at 01/13/16 2300  None.    Reyne Dumas , MD,  [facility] Pager [phone]  If 7PM-7AM, please contact night-coverage [email or web address] Password [facility] 01/14/2016, 12:37 PM

## 2016-01-14 NOTE — Care Management Important Message (Signed)
Important Message  Patient Details  Name: [name] MRN: [id number] Date of Birth: Nov 16, 1931   Medicare Important Message Given:  Yes    Nathen May 01/14/2016, 1:56 PM

## 2016-01-14 NOTE — Care Management Note (Signed)
Case Management Note  Patient Details  Name: [name] MRN: [id number] Date of Birth: [birth year]  Subjective/Objective:    Patient is from home with spouse, s/p Extended open left hemicolectomy and sigmoidectomy.  NCM will continue to follow for dc needs, await pt eval.                 Action/Plan:   Expected Discharge Date:  01/05/16               Expected Discharge Plan:  [place]  In-House Referral:  Clinical Social Work  Discharge planning Services  CM Consult  Post Acute Care Choice:    Choice offered to:     DME Arranged:    DME Agency:     HH Arranged:    [place] Agency:     Status of Service:  In process, will continue to follow  Medicare Important Message Given:  Yes Date Medicare IM Given:    Medicare IM give by:    Date Additional Medicare IM Given:    Additional Medicare Important Message give by:     If discussed at [place] of Stay Meetings, dates discussed:    Additional Comments:  Zenon Mayo, RN 01/14/2016, 12:16 PM

## 2016-01-15 DIAGNOSIS — I5042 Chronic combined systolic (congestive) and diastolic (congestive) heart failure: Secondary | ICD-10-CM

## 2016-01-15 LAB — CBC
HEMATOCRIT: 24.4 % — AB (ref 39.0–52.0)
HEMOGLOBIN: 7.8 g/dL — AB (ref 13.0–17.0)
MCH: 29.3 pg (ref 26.0–34.0)
MCHC: 32 g/dL (ref 30.0–36.0)
MCV: 91.7 fL (ref 78.0–100.0)
Platelets: 194 10*3/uL (ref 150–400)
RBC: 2.66 MIL/uL — ABNORMAL LOW (ref 4.22–5.81)
RDW: 14.8 % (ref 11.5–15.5)
WBC: 15.8 10*3/uL — ABNORMAL HIGH (ref 4.0–10.5)

## 2016-01-15 LAB — PREPARE RBC (CROSSMATCH)

## 2016-01-15 LAB — COMPREHENSIVE METABOLIC PANEL
ALK PHOS: 63 U/L (ref 38–126)
ALT: 23 U/L (ref 17–63)
AST: 63 U/L — ABNORMAL HIGH (ref 15–41)
Albumin: 2.7 g/dL — ABNORMAL LOW (ref 3.5–5.0)
Anion gap: 10 (ref 5–15)
BILIRUBIN TOTAL: 0.7 mg/dL (ref 0.3–1.2)
BUN: 24 mg/dL — ABNORMAL HIGH (ref 6–20)
CALCIUM: 8.7 mg/dL — AB (ref 8.9–10.3)
CO2: 24 mmol/L (ref 22–32)
CREATININE: 1.94 mg/dL — AB (ref 0.61–1.24)
Chloride: 109 mmol/L (ref 101–111)
GFR, EST AFRICAN AMERICAN: 36 mL/min — AB (ref >60)
GFR, EST NON AFRICAN AMERICAN: 31 mL/min — AB (ref >60)
Glucose, Bld: 152 mg/dL — ABNORMAL HIGH (ref 65–99)
Potassium: 3.8 mmol/L (ref 3.5–5.1)
SODIUM: 143 mmol/L (ref 135–145)
Total Protein: 5.7 g/dL — ABNORMAL LOW (ref 6.5–8.1)

## 2016-01-15 LAB — GLUCOSE, CAPILLARY
GLUCOSE-CAPILLARY: 127 mg/dL — AB (ref 65–99)
Glucose-Capillary: 138 mg/dL — ABNORMAL HIGH (ref 65–99)
Glucose-Capillary: 137 mg/dL — ABNORMAL HIGH (ref 65–99)
Glucose-Capillary: 166 mg/dL — ABNORMAL HIGH (ref 65–99)

## 2016-01-15 LAB — TROPONIN I
TROPONIN I: 8.73 ng/mL — AB (ref <0.031)
Troponin I: 5.24 ng/mL (ref <0.031)
Troponin I: 12.06 ng/mL (ref <0.031)

## 2016-01-15 LAB — HEMOGLOBIN AND HEMATOCRIT, BLOOD
HCT: 23.7 % — ABNORMAL LOW (ref 39.0–52.0)
Hemoglobin: 7.7 g/dL — ABNORMAL LOW (ref 13.0–17.0)

## 2016-01-15 LAB — HEPARIN LEVEL (UNFRACTIONATED): HEPARIN UNFRACTIONATED: 0.29 [IU]/mL — AB (ref 0.30–0.70)

## 2016-01-15 MED ORDER — SODIUM CHLORIDE 0.9 % IV SOLN: Freq: Once | INTRAVENOUS | Status: DC

## 2016-01-15 MED ORDER — HEPARIN (PORCINE) IN NACL 100-0.45 UNIT/ML-% IJ SOLN
950.0000 [IU]/h | INTRAMUSCULAR | Status: DC
  Administered 2016-01-15: 800 [IU]/h via INTRAVENOUS
  Administered 2016-01-15: 850 [IU]/h via INTRAVENOUS
  Filled 2016-01-15 (×2): qty 250

## 2016-01-15 MED ORDER — NITROGLYCERIN IN D5W 200-5 MCG/ML-% IV SOLN
2.0000 ug/min | INTRAVENOUS | Status: DC
  Administered 2016-01-15: 5 ug/min via INTRAVENOUS
  Filled 2016-01-15: qty 250

## 2016-01-15 MED ORDER — FUROSEMIDE 10 MG/ML IJ SOLN
40.0000 mg | Freq: Once | INTRAMUSCULAR | Status: AC
  Administered 2016-01-15: 40 mg via INTRAVENOUS
  Filled 2016-01-15: qty 4

## 2016-01-15 MED ORDER — SODIUM CHLORIDE 0.9 % IV SOLN: Freq: Once | INTRAVENOUS | Status: AC

## 2016-01-15 MED ORDER — METOPROLOL TARTRATE 25 MG PO TABS
25.0000 mg | ORAL_TABLET | Freq: Two times a day (BID) | ORAL | Status: DC
  Administered 2016-01-15 – 2016-01-16 (×3): 25 mg via ORAL
  Filled 2016-01-15 (×3): qty 1

## 2016-01-15 MED ORDER — ASPIRIN 325 MG PO TABS
325.0000 mg | ORAL_TABLET | Freq: Once | ORAL | Status: AC
  Administered 2016-01-15: 325 mg via ORAL
  Filled 2016-01-15: qty 1

## 2016-01-15 NOTE — Progress Notes (Addendum)
PROGRESS NOTE  [name] [id number] DOB: 1932/10/17 DOA: 01/03/2016 PCP: [name], MD Outpatient Specialists:    LOS: 12 days   Brief Narrative: [name] is an 82 year old male who was admitted on 01/03/2016 with chest pain and BRBPR x 3-4 days. History of diverticular bleeding with surgical intervention. Patient underwent full evaluation by gastroenterology, he has ongoing bleeding, Gen. surgery was consulted and patient will be taken to the operating room on 3/1. Cardiology was consulted for preop clearance given ongoing chest pains.  Assessment & Plan: Principal Problem:   GI bleed Active Problems:   CKD (chronic kidney disease) stage 3, GFR 30-59 ml/min   Acute blood loss anemia   Acute on chronic diastolic (congestive) heart failure ([name])   Diabetes mellitus ([name])   Diastolic CHF ([name])   Atrial fibrillation ([name])   Chronic combined systolic and diastolic CHF (congestive heart failure) ([name])   Pulmonary hypertension ([name])   CKD (chronic kidney disease), stage III   History of GI diverticular bleed   Palliative care encounter   DNR (do not resuscitate) discussion   Rectal bleeding   CAD in native artery   Acute GI bleed/Acute blood loss anemia - Recurrent diverticular bleed despite cessation of Brilinta 5-6 weeks ago. - Colonoscopy 01/05/2016 revealed diverticulosis: severe in the left colon, moderate in the transverse colon and mild in the right colon. No evidence of active bleeding or old blood. - Nuclear RBC scan 01/10/2016 revealed increased activity in the left mid-abd, likely the distal transverse colon and proximal descending colon consistent with acute hemorrhage. - Cardiology cleared patient for surgery without further testing; patient is a high-risk surgical candidate - Suspected LGIB from diverticuli;open left hemicolectomy and sigmoidectomy  3/1 - received total 4U pRBC so far Will DC NGT Hemoglobin change from 9.2> 7.7, follow CBC closely  Acute  blood loss anemia We will transfuse 2 units of packed red blood cells in the setting of abnormal troponin, non-ST elevation MI  CKD stage 3 - Appears euvolemic. Cr stable. Follow.   Chronic combined systolic and diastolic CHF/Acute on chronic diastolic (congestive) heart failure - Appears euvolemic - Cardiology following  Chest pain with coronary artery disease as well as NSTEMI. Now with suspected recurrent non-ST elevation MI ,CAD s/p CABG 1996 with redo 2006 by [name]. Patient had recent h/o exertional angina, although current anemia can contribute to angina, he likely also has significant underlying coronary artery disease given persistent ST depression in the lateral leads, has anterior wall motion abnormality seen on recent echocardiogram despite having a stable EF of 50-55%. ECG post op yesterday with ST at 106 and slightly more pronounced ST depression. Developed recurrent anginal symptoms last night Troponin trending up, continue metoprolol, nitroglycerin, heparin, We'll keep patient NPO  pending decision from cardiology about a cardiac cath    Diabetes mellitus - HgbA1c: 6.0 on arrival - Continue SSI  History of Atrial fibrillation  - No anticoagulation due to recurrent GIB - Current NSR  Essential hypertension - Blood pressure well controlled.  - Continue metoprolol   carotid artery disease s/p R CEA by [name] '94  DVT prophylaxis: SCDs Code Status: Full Family Communication: Patient daughter is by the bedside  Disposition Plan: Critically ill, continue stepdown, transfuse 2 units of packed red blood cells  Barriers for discharge: LGIB  Consultants:   Gastroenterology  Cardiology  Surgery  Palliative   Procedures:   Colonoscopy 01/05/2016  Antimicrobials:  None   Subjective: Patient complained of  crushing chest pressure this morning, found to have abnormal troponin and started on a heparin drip last night   Objective: Filed  Vitals:   01/15/16 0700 01/15/16 0800 01/15/16 0814 01/15/16 1139  BP: 123/51  127/59 125/58  Pulse: 85   96  Temp:   97.6 F (36.4 C) 98.1 F (36.7 C)  TempSrc:   Oral Oral  Resp: 9 9  18   Height:      Weight:      SpO2: 100% 100%  98%    Intake/Output Summary (Last 24 hours) at 01/15/16 1347 Last data filed at 01/15/16 1100  Gross per 24 hour  Intake 1612.78 ml  Output   1375 ml  Net 237.78 ml   Filed Weights   01/13/16 0619 01/14/16 0751 01/15/16 0030  Weight: 81.103 kg (178 lb 12.8 oz) 81 kg (178 lb 9.2 oz) 80 kg (176 lb 5.9 oz)   Examination: BP 125/58 mmHg  Pulse 96  Temp(Src) 98.1 F (36.7 C) (Oral)  Resp 18  Ht 5\' 9"  (1.753 m)  Wt 80 kg (176 lb 5.9 oz)  BMI 26.03 kg/m2  SpO2 98%   General: Pleasant, NAD. Currently pain free Neuro: Alert and oriented X 3. Moves all extremities spontaneously. Psych: Normal affect. HEENT: Normal Neck: Supple without bruits or JVD. Lungs: Resp regular and unlabored, CTA. Heart: RRR 1-2/6 sem no s3. Abdomen: Midline incision noted, covered with clear dressing, no obvious active bleeding, some dried blood. No bowel sound Extremities: No clubbing, cyanosis or edema. DP/PT/Radials 2+ and equal bilaterally.  Data Reviewed: I have personally reviewed following labs and imaging studies  CBC:  Recent Labs Lab 01/10/16 0042 01/10/16 0634  01/13/16 [id number] 01/14/16 0435 01/14/16 2315 01/15/16 0520 01/15/16 0950  WBC 7.2 7.3  --  7.7 13.6* 17.1* 15.8*  --   NEUTROABS 4.3 4.6  --   --   --   --   --   --   HGB 8.0* 8.0*  < > 9.2* 8.2* 8.6* 7.8* 7.7*  HCT 25.3* 25.1*  < > 29.0* 25.5* 27.5* 24.4* 23.7*  MCV 91.3 91.3  --  90.9 90.1 91.7 91.7  --   PLT 191 194  --  207 199 204 194  --   < > = values in this interval not displayed. Basic Metabolic Panel:  Recent Labs Lab 01/09/16 0614 01/13/16 0613 01/14/16 0435 01/15/16 0520  NA 142 141 141 143  K 3.7 3.6 3.6 3.8  CL 104 102 108 109  CO2 26 28 24 24     GLUCOSE 167* 161* 262* 152*  BUN 23* 28* 23* 24*  CREATININE 1.93* 1.83* 2.05* 1.94*  CALCIUM 9.1 8.9 8.4* 8.7*  MG  --  2.1 1.9  --    GFR: Estimated Creatinine Clearance: 29.9 mL/min (by C-G formula based on Cr of 1.94). Liver Function Tests:  Recent Labs Lab 01/09/16 0614 01/15/16 0520  AST 23 63*  ALT 20 23  ALKPHOS 60 63  BILITOT 0.7 0.7  PROT 6.2* 5.7*  ALBUMIN 3.2* 2.7*   No results for input(s): LIPASE, AMYLASE in the last 168 hours. No results for input(s): AMMONIA in the last 168 hours. Coagulation Profile:  Recent Labs Lab 01/13/16 0613  INR 1.18   Cardiac Enzymes:  Recent Labs Lab 01/14/16 2315 01/15/16 0520 01/15/16 0950  TROPONINI 5.24* 8.73* 12.06*   BNP (last 3 results) No results for input(s): PROBNP in the last 8760 hours. HbA1C: No results for input(s): HGBA1C in the  last 72 hours. CBG:  Recent Labs Lab 01/14/16 1153 01/14/16 1605 01/14/16 2103 01/15/16 0809 01/15/16 1146  GLUCAP 243* 162* 143* 127* 138*   Lipid Profile: No results for input(s): CHOL, HDL, LDLCALC, TRIG, CHOLHDL, LDLDIRECT in the last 72 hours. Thyroid Function Tests: No results for input(s): TSH, T4TOTAL, FREET4, T3FREE, THYROIDAB in the last 72 hours. Anemia Panel: No results for input(s): VITAMINB12, FOLATE, FERRITIN, TIBC, IRON, RETICCTPCT in the last 72 hours. Urine analysis:    Component Value Date/Time   COLORURINE YELLOW 11/14/2015 [place] 11/14/2015 0241   LABSPEC 1.018 11/14/2015 0241   PHURINE 5.0 11/14/2015 0241   GLUCOSEU NEGATIVE 11/14/2015 0241   HGBUR NEGATIVE 11/14/2015 0241   HGBUR negative 09/12/2007 1028   BILIRUBINUR NEGATIVE 11/14/2015 0241   KETONESUR NEGATIVE 11/14/2015 0241   PROTEINUR NEGATIVE 11/14/2015 0241   UROBILINOGEN 0.2 06/14/2014 2210   NITRITE NEGATIVE 11/14/2015 0241   LEUKOCYTESUR NEGATIVE 11/14/2015 0241   Sepsis Labs: Invalid input(s): PROCALCITONIN, LACTICIDVEN  Recent Results (from the past  240 hour(s))  Surgical pcr screen     Status: None   Collection Time: 01/13/16  6:19 AM  Result Value Ref Range Status   MRSA, PCR NEGATIVE NEGATIVE Final   Staphylococcus aureus NEGATIVE NEGATIVE Final    Comment:        The Xpert SA Assay (FDA approved for NASAL specimens in patients over 86 years of age), is one component of a comprehensive surveillance program.  Test performance has been validated by [facility] for patients greater than or equal to 78 year old. It is not intended to diagnose infection nor to guide or monitor treatment.       Radiology Studies: No results found.   Scheduled Meds: . sodium chloride   Intravenous Once  . sodium chloride   Intravenous Once  . alvimopan  12 mg Oral BID  . [START ON 01/16/2016] aspirin EC  81 mg Oral Daily  . HYDROmorphone   Intravenous 6 times per day  . insulin aspart  0-15 Units Subcutaneous TID WC  . insulin aspart  0-5 Units Subcutaneous QHS  . isosorbide mononitrate  60 mg Oral BID WC  . metoprolol  2.5 mg Intravenous 4 times per day  . metoprolol tartrate  25 mg Oral BID  . pantoprazole (PROTONIX) IV  40 mg Intravenous Q12H   Continuous Infusions: . sodium chloride 75 mL/hr at 01/14/16 2300  . heparin 800 Units/hr (01/15/16 1100)  . nitroGLYCERIN 20 mcg/min (01/15/16 1326)  None.   Reyne Dumas , MD,  [facility] Pager [phone]  If 7PM-7AM, please contact night-coverage [email or web address] Password [email or web address] 01/15/2016, 1:47 PM

## 2016-01-15 NOTE — Progress Notes (Signed)
ANTICOAGULATION CONSULT NOTE - Initial Consult  Pharmacy Consult for Heparin  Indication: chest pain/ACS  No Known Allergies  Patient Measurements: Height: 5\' 9"  (175.3 cm) Weight: 176 lb 5.9 oz (80 kg) IBW/kg (Calculated) : 70.7  Vital Signs: Temp: 97.9 F (36.6 C) (03/02 2355) Temp Source: Oral (03/02 2355) BP: 160/72 mmHg (03/02 2355) Pulse Rate: 97 (03/03 0030)  Labs:  Recent Labs  01/13/16 0613 01/14/16 0435 01/14/16 2315  HGB 9.2* 8.2* 8.6*  HCT 29.0* 25.5* 27.5*  PLT 207 199 204  APTT 31  --   --   LABPROT 15.2  --   --   INR 1.18  --   --   CREATININE 1.83* 2.05*  --   TROPONINI  --   --  5.24*    Estimated Creatinine Clearance: 28.3 mL/min (by C-G formula based on Cr of 2.05).  Assessment: Patient is POD #2 from hemicolectomy/sigmoidectomy after having recurrent lower GI bleeds, pt is now having significant cardiac issues and has a troponin of 5.24, cardiology has discussed with surgery and they are ok with heparin at this time. Hgb is 8.6 (fairly stable post-operatively). Noted renal dysfunction. Other labs reviewed.   Goal of Therapy:  Heparin level 0.3-0.5 units/ml, with hx GIB Monitor platelets by anticoagulation protocol: Yes   Plan:  -NO BOLUS -Start heparin drip conservatively at 800 units/hr -0900 HL -Daily CBC/HL -Monitor for bleeding very closely, trend Hgb  Narda Bonds 01/15/2016,12:52 AM

## 2016-01-15 NOTE — Progress Notes (Signed)
0022 Paged [place] regarding critical Troponin 5.24 0023 Page Cardiology regarding above critical value 0023 [facility] returned page: deferred management to Cardiology  0025 Cardiology (Dr. Eula Fried) returned page: endorsed he would consult with [facility] to determine if Heparin gtt can be initiated. Awaiting orders. Also endorsed he would come and speak with pt and wife. Approx. 0100 Dr. Eula Fried spoke with pt and wife regarding result, heparin gtt and nitro gtt ordered along with PO aspirin. Possibility of heart cath discussed.  Will continue to monitor and assess.

## 2016-01-15 NOTE — Care Management Important Message (Signed)
Important Message  Patient Details  Name: [name] MRN: [id number] Date of Birth: 1932-05-04   Medicare Important Message Given:  Yes    Nathen May 01/15/2016, 11:47 AM

## 2016-01-15 NOTE — Progress Notes (Signed)
Patient ID: [name], male   DOB: November 23, 1931, 82 y.o.   MRN: [id number]     [address] [place], [place] [address]    Phone: [phone] FAX: [phone]     Subjective: No flatus.  No n/v.  537m NGT output.   Events from last night noted, pain free now. On PCA Troponin up to 8.7 H&h 7.8/24.4 UOP okay, sCr trending down.   Objective:  Vital signs:  Filed Vitals:   01/15/16 0600 01/15/16 0700 01/15/16 0800 01/15/16 0814  BP: 119/58 123/51  127/59  Pulse: 84 85    Temp:      TempSrc:    Oral  Resp: '11 9 9   '$ Height:      Weight:      SpO2: 100% 100% 100%     Last BM Date: 01/13/16  Intake/Output   Yesterday:  03/02 0701 - 03/03 0700 In: 2106.5 [I.V.:1706.5; IV Piggyback:400] Out: 1350 [Urine:800; Emesis/NG output:550] This shift:     Physical Exam: General: Pt awake/alert/oriented x4 in no acute distress  Abdomen: Soft.  Nondistended. +bs, tender over incision.  Incision is c/d/i.   No evidence of peritonitis.  No incarcerated hernias.    Problem List:   Principal Problem:   GI bleed Active Problems:   CKD (chronic kidney disease) stage 3, GFR 30-59 ml/min   Acute blood loss anemia   Acute on chronic diastolic (congestive) heart failure ([name])   Diabetes mellitus ([name])   Diastolic CHF ([name])   Atrial fibrillation ([name])   Chronic combined systolic and diastolic CHF (congestive heart failure) ([name])   Pulmonary hypertension ([name])   CKD (chronic kidney disease), stage III   History of GI diverticular bleed   Palliative care encounter   DNR (do not resuscitate) discussion   Rectal bleeding   CAD in native artery    Results:   Labs: Results for orders placed or performed during the hospital encounter of 01/03/16 (from the past 488hour(s))  Glucose, capillary     Status: Abnormal   Collection Time: 01/13/16 10:21 AM  Result Value Ref Range   Glucose-Capillary 154 (H) 65 - 99  mg/dL  Glucose, capillary     Status: Abnormal   Collection Time: 01/13/16  2:09 PM  Result Value Ref Range   Glucose-Capillary 175 (H) 65 - 99 mg/dL   Comment 1 Notify RN   Glucose, capillary     Status: Abnormal   Collection Time: 01/13/16  9:14 PM  Result Value Ref Range   Glucose-Capillary 284 (H) 65 - 99 mg/dL  CBC     Status: Abnormal   Collection Time: 01/14/16  4:35 AM  Result Value Ref Range   WBC 13.6 (H) 4.0 - 10.5 K/uL   RBC 2.83 (L) 4.22 - 5.81 MIL/uL   Hemoglobin 8.2 (L) 13.0 - 17.0 g/dL   HCT 25.5 (L) 39.0 - 52.0 %   MCV 90.1 78.0 - 100.0 fL   MCH 29.0 26.0 - 34.0 pg   MCHC 32.2 30.0 - 36.0 g/dL   RDW 14.2 11.5 - 15.5 %   Platelets 199 150 - 400 K/uL  Basic metabolic panel     Status: Abnormal   Collection Time: 01/14/16  4:35 AM  Result Value Ref Range   Sodium 141 135 - 145 mmol/L   Potassium 3.6 3.5 - 5.1 mmol/L   Chloride 108 101 - 111 mmol/L   CO2  24 22 - 32 mmol/L   Glucose, Bld 262 (H) 65 - 99 mg/dL   BUN 23 (H) 6 - 20 mg/dL   Creatinine, Ser 2.05 (H) 0.61 - 1.24 mg/dL   Calcium 8.4 (L) 8.9 - 10.3 mg/dL   GFR calc non Af Amer 29 (L) >60 mL/min   GFR calc Af Amer 33 (L) >60 mL/min    Comment: (NOTE) The eGFR has been calculated using the CKD EPI equation. This calculation has not been validated in all clinical situations. eGFR's persistently <60 mL/min signify possible Chronic Kidney Disease.    Anion gap 9 5 - 15  Magnesium     Status: None   Collection Time: 01/14/16  4:35 AM  Result Value Ref Range   Magnesium 1.9 1.7 - 2.4 mg/dL  Glucose, capillary     Status: Abnormal   Collection Time: 01/14/16  7:47 AM  Result Value Ref Range   Glucose-Capillary 236 (H) 65 - 99 mg/dL  Glucose, capillary     Status: Abnormal   Collection Time: 01/14/16 11:53 AM  Result Value Ref Range   Glucose-Capillary 243 (H) 65 - 99 mg/dL  Glucose, capillary     Status: Abnormal   Collection Time: 01/14/16  4:05 PM  Result Value Ref Range   Glucose-Capillary 162  (H) 65 - 99 mg/dL  Glucose, capillary     Status: Abnormal   Collection Time: 01/14/16  9:03 PM  Result Value Ref Range   Glucose-Capillary 143 (H) 65 - 99 mg/dL  CBC     Status: Abnormal   Collection Time: 01/14/16 11:15 PM  Result Value Ref Range   WBC 17.1 (H) 4.0 - 10.5 K/uL   RBC 3.00 (L) 4.22 - 5.81 MIL/uL   Hemoglobin 8.6 (L) 13.0 - 17.0 g/dL   HCT 27.5 (L) 39.0 - 52.0 %   MCV 91.7 78.0 - 100.0 fL   MCH 28.7 26.0 - 34.0 pg   MCHC 31.3 30.0 - 36.0 g/dL   RDW 14.6 11.5 - 15.5 %   Platelets 204 150 - 400 K/uL  Troponin I (q 6hr x 3)     Status: Abnormal   Collection Time: 01/14/16 11:15 PM  Result Value Ref Range   Troponin I 5.24 (HH) <0.031 ng/mL    Comment:        POSSIBLE MYOCARDIAL ISCHEMIA. SERIAL TESTING RECOMMENDED. CRITICAL RESULT CALLED TO, READ BACK BY AND VERIFIED WITH: YORK M,RN 01/15/16 0016 WAYK   CBC     Status: Abnormal   Collection Time: 01/15/16  5:20 AM  Result Value Ref Range   WBC 15.8 (H) 4.0 - 10.5 K/uL   RBC 2.66 (L) 4.22 - 5.81 MIL/uL   Hemoglobin 7.8 (L) 13.0 - 17.0 g/dL   HCT 24.4 (L) 39.0 - 52.0 %   MCV 91.7 78.0 - 100.0 fL   MCH 29.3 26.0 - 34.0 pg   MCHC 32.0 30.0 - 36.0 g/dL   RDW 14.8 11.5 - 15.5 %   Platelets 194 150 - 400 K/uL  Comprehensive metabolic panel     Status: Abnormal   Collection Time: 01/15/16  5:20 AM  Result Value Ref Range   Sodium 143 135 - 145 mmol/L   Potassium 3.8 3.5 - 5.1 mmol/L   Chloride 109 101 - 111 mmol/L   CO2 24 22 - 32 mmol/L   Glucose, Bld 152 (H) 65 - 99 mg/dL   BUN 24 (H) 6 - 20 mg/dL   Creatinine, Ser 1.94 (H) 0.61 -  1.24 mg/dL   Calcium 8.7 (L) 8.9 - 10.3 mg/dL   Total Protein 5.7 (L) 6.5 - 8.1 g/dL   Albumin 2.7 (L) 3.5 - 5.0 g/dL   AST 63 (H) 15 - 41 U/L   ALT 23 17 - 63 U/L   Alkaline Phosphatase 63 38 - 126 U/L   Total Bilirubin 0.7 0.3 - 1.2 mg/dL   GFR calc non Af Amer 31 (L) >60 mL/min   GFR calc Af Amer 36 (L) >60 mL/min    Comment: (NOTE) The eGFR has been calculated using the  CKD EPI equation. This calculation has not been validated in all clinical situations. eGFR's persistently <60 mL/min signify possible Chronic Kidney Disease.    Anion gap 10 5 - 15  Troponin I (q 6hr x 3)     Status: Abnormal   Collection Time: 01/15/16  5:20 AM  Result Value Ref Range   Troponin I 8.73 (HH) <0.031 ng/mL    Comment:        POSSIBLE MYOCARDIAL ISCHEMIA. SERIAL TESTING RECOMMENDED. CRITICAL VALUE NOTED.  VALUE IS CONSISTENT WITH PREVIOUSLY REPORTED AND CALLED VALUE.   Glucose, capillary     Status: Abnormal   Collection Time: 01/15/16  8:09 AM  Result Value Ref Range   Glucose-Capillary 127 (H) 65 - 99 mg/dL   Comment 1 Notify RN    Comment 2 Document in Chart     Imaging / Studies: No results found.  Medications / Allergies:  Scheduled Meds: . alvimopan  12 mg Oral BID  . [START ON 01/16/2016] aspirin EC  81 mg Oral Daily  . HYDROmorphone   Intravenous 6 times per day  . insulin aspart  0-15 Units Subcutaneous TID WC  . insulin aspart  0-5 Units Subcutaneous QHS  . isosorbide mononitrate  60 mg Oral BID WC  . metoprolol  2.5 mg Intravenous 4 times per day  . pantoprazole (PROTONIX) IV  40 mg Intravenous Q12H   Continuous Infusions: . sodium chloride 75 mL/hr at 01/14/16 2300  . heparin 800 Units/hr (01/15/16 0800)  . nitroGLYCERIN 10 mcg/min (01/15/16 0800)   PRN Meds:.diphenhydrAMINE **OR** diphenhydrAMINE, naloxone **AND** sodium chloride flush, ondansetron **OR** ondansetron (ZOFRAN) IV  Antibiotics: Anti-infectives    Start     Dose/Rate Route Frequency Ordered Stop   01/14/16 0800  cefoTEtan (CEFOTAN) 2 g in dextrose 5 % 50 mL IVPB     2 g 100 mL/hr over 30 Minutes Intravenous  Once 01/13/16 1755 01/14/16 0930   01/13/16 1415  cefoTEtan (CEFOTAN) 2 g in dextrose 5 % 50 mL IVPB  Status:  Discontinued     2 g 100 mL/hr over 30 Minutes Intravenous Every 12 hours 01/13/16 1412 01/13/16 1718   01/13/16 1012  cefoTEtan in Dextrose 5% (CEFOTAN) 2-2.08  GM-% IVPB    Comments:  Leandrew Koyanagi   : cabinet override      01/13/16 1012 01/13/16 2229   01/13/16 0600  cefoTEtan (CEFOTAN) 2 g in dextrose 5 % 50 mL IVPB     2 g 100 mL/hr over 30 Minutes Intravenous On call to O.R. 01/12/16 1307 01/13/16 1112   01/12/16 1400  neomycin (MYCIFRADIN) tablet 1,000 mg     1,000 mg Oral 3 times per day 01/12/16 1307 01/12/16 2332   01/12/16 1400  erythromycin (E-MYCIN) tablet 1,000 mg     1,000 mg Oral 3 times per day 01/12/16 1307 01/12/16 2332        Assessment/Plan Recurrent lower GI bleed  POD#2 exploratory laparotomy, splenic flexure mobilization, extended left hemicolectomy and sigmoidectomy---Dr. Rosendo Gros -continue bowel rest, NGT decompression.  Can probably start clamping trial soon, but will await any cardiology interventions -transfuse per primary team NSTEMI-nitro gtt and heparin gtt FEN-NPO, IVF, PCA   Erby Pian, ANP-BC [facility] Pager [phone](7A-4:30P) For consults and floor pages call [phone](7A-4:30P)  01/15/2016 9:10 AM

## 2016-01-15 NOTE — Progress Notes (Signed)
[place] for Heparin  Indication: chest pain/ACS  No Known Allergies  Patient Measurements: Height: 5\' 9"  (175.3 cm) Weight: 176 lb 5.9 oz (80 kg) IBW/kg (Calculated) : 70.7  Vital Signs: Temp: 98 F (36.7 C) (03/03 0345) Temp Source: Oral (03/03 0814) BP: 127/59 mmHg (03/03 0814) Pulse Rate: 85 (03/03 0700)  Labs:  Recent Labs  01/13/16 [id number] 01/14/16 0435 01/14/16 2315 01/15/16 0520  HGB 9.2* 8.2* 8.6* 7.8*  HCT 29.0* 25.5* 27.5* 24.4*  PLT 207 199 204 194  APTT 31  --   --   --   LABPROT 15.2  --   --   --   INR 1.18  --   --   --   CREATININE 1.83* 2.05*  --  1.94*  TROPONINI  --   --  5.24* 8.73*    Estimated Creatinine Clearance: 29.9 mL/min (by C-G formula based on Cr of 1.94).  Assessment: 83 yom POD #2 from hemicolectomy/sigmoidectomy after having recurrent lower GI bleeds. Pt is now having significant cardiac issues and has a troponin up to 8.73. Cardiology has discussed with surgery, and they are ok with heparin at this time. Hgb is 7.8 (fairly stable post-operatively), plt wnl.  HL very slightly low (0.29) on 800 units/h (0.3-0.5 goal). No bleed or line issues per RN.  Goal of Therapy:  Heparin level 0.3-0.5 units/ml, with hx GIB Monitor platelets by anticoagulation protocol: Yes   Plan:  -NO BOLUS -Increase heparin drip conservatively to 850 units/hr -6h HL -Daily CBC/HL -Monitor for bleeding very closely, trend Hgb  Elicia Lamp, PharmD, [facility] Clinical Pharmacist Pager [phone] 01/15/2016 8:36 AM

## 2016-01-15 NOTE — Progress Notes (Signed)
PT Cancellation Note  Patient Details Name: DIERK MIKAN MRN: [id number] DOB: May 04, 1932   Cancelled Treatment:    Reason Eval/Treat Not Completed: Medical issues which prohibited therapy.  Hgb low and Troponin high.  Will hold PT until appropriate.  Thank you for this order.  [name] PT, DPT  Pager: [phone] Phone: [phone] 01/15/2016, 2:42 PM

## 2016-01-15 NOTE — Plan of Care (Signed)
Called Dr. Greer Pickerel and discussed if patient can be started on aspirin and heparin. Since cardiac issue is foremost, he was of the opinion to go ahead with the anticoagulants at this time. EKG did not demonstrate ST elevation. NTG started for BP control and pain. CBC Q6 hours to monitor for hb changes. Discussed this with the patient and he showed understanding.

## 2016-01-15 NOTE — Progress Notes (Addendum)
Patient Name: [name] Date of Encounter: 01/15/2016  Primary Cardiologist: Dr. Gwenlyn Found (formerly Dr. [name])   Principal Problem:   GI bleed Active Problems:   CKD (chronic kidney disease) stage 3, GFR 30-59 ml/min   Acute blood loss anemia   Acute on chronic diastolic (congestive) heart failure ([name])   Diabetes mellitus ([name])   Diastolic CHF ([name])   Atrial fibrillation ([name])   Chronic combined systolic and diastolic CHF (congestive heart failure) ([place])   Pulmonary hypertension ([name])   CKD (chronic kidney disease), stage III   History of GI diverticular bleed   Palliative care encounter   DNR (do not resuscitate) discussion   Rectal bleeding   CAD in native artery    SUBJECTIVE  Pt developed recurrent angina last night; 5/10  Now on low dose iv NTG at 10 ug and heparin.  CURRENT MEDS . alvimopan  12 mg Oral BID  . [START ON 01/16/2016] aspirin EC  81 mg Oral Daily  . HYDROmorphone   Intravenous 6 times per day  . insulin aspart  0-15 Units Subcutaneous TID WC  . insulin aspart  0-5 Units Subcutaneous QHS  . isosorbide mononitrate  60 mg Oral BID WC  . metoprolol  2.5 mg Intravenous 4 times per day  . pantoprazole (PROTONIX) IV  40 mg Intravenous Q12H    OBJECTIVE  Filed Vitals:   01/15/16 0700 01/15/16 0800 01/15/16 0814 01/15/16 1139  BP: 123/51  127/59 125/58  Pulse: 85   96  Temp:   97.6 F (36.4 C)   TempSrc:   Oral Oral  Resp: '9 9  18  '$ Height:      Weight:      SpO2: 100% 100%  98%    Intake/Output Summary (Last 24 hours) at 01/15/16 1304 Last data filed at 01/15/16 0800  Gross per 24 hour  Intake 1612.78 ml  Output   1225 ml  Net 387.78 ml   Filed Weights   01/13/16 0619 01/14/16 0751 01/15/16 0030  Weight: 178 lb 12.8 oz (81.103 kg) 178 lb 9.2 oz (81 kg) 176 lb 5.9 oz (80 kg)    PHYSICAL EXAM  General: Pleasant, NAD. Currently pain free Neuro: Alert and oriented X 3. Moves all extremities spontaneously. Psych: Normal affect. HEENT:   Normal  Neck: Supple without bruits or JVD. Lungs:  Resp regular and unlabored, CTA. Heart: RRR  1-2/6 sem no s3. Abdomen: Midline incision noted, covered with clear dressing, no obvious active bleeding, some dried blood. No bowel sound Extremities: No clubbing, cyanosis or edema. DP/PT/Radials 2+ and equal bilaterally.  Accessory Clinical Findings  CBC  Recent Labs  01/14/16 2315 01/15/16 0520 01/15/16 0950  WBC 17.1* 15.8*  --   HGB 8.6* 7.8* 7.7*  HCT 27.5* 24.4* 23.7*  MCV 91.7 91.7  --   PLT 204 194  --    BMP Latest Ref Rng 01/15/2016 01/14/2016 01/13/2016  Glucose 65 - 99 mg/dL 152(H) 262(H) 161(H)  BUN 6 - 20 mg/dL 24(H) 23(H) 28(H)  Creatinine 0.61 - 1.24 mg/dL 1.94(H) 2.05(H) 1.83(H)  Sodium 135 - 145 mmol/L 143 141 141  Potassium 3.5 - 5.1 mmol/L 3.8 3.6 3.6  Chloride 101 - 111 mmol/L 109 108 102  CO2 22 - 32 mmol/L '24 24 28  '$ Calcium 8.9 - 10.3 mg/dL 8.7(L) 8.4(L) 8.9   Hepatic Function Latest Ref Rng 01/15/2016 01/09/2016 01/08/2016  Total Protein 6.5 - 8.1 g/dL 5.7(L) 6.2(L) 5.5(L)  Albumin 3.5 - 5.0 g/dL 2.7(L)  3.2(L) 2.9(L)  AST 15 - 41 U/L 63(H) 23 20  ALT 17 - 63 U/L '23 20 18  '$ Alk Phosphatase 38 - 126 U/L 63 60 56  Total Bilirubin 0.3 - 1.2 mg/dL 0.7 0.7 0.7   CBC Latest Ref Rng 01/15/2016 01/15/2016 01/14/2016  WBC 4.0 - 10.5 K/uL - 15.8(H) 17.1(H)  Hemoglobin 13.0 - 17.0 g/dL 7.7(L) 7.8(L) 8.6(L)  Hematocrit 39.0 - 52.0 % 23.7(L) 24.4(L) 27.5(L)  Platelets 150 - 400 K/uL - 194 204   Lab Results  Component Value Date   MCV 91.7 01/15/2016   MCV 91.7 01/14/2016   MCV 90.1 01/14/2016   Troponin (Point of Care Test) No results for input(s): TROPIPOC in the last 72 hours.    TELE NSR without significant ventricular ectopy    3/2 post op ECG (independently read by me): ST at 106; RBBB slight ly more pronounced ST depression    Echocardiogram 11/17/2015  LV EF: 50% -  55%  ------------------------------------------------------------------- Indications:    Fever 780.6.  ------------------------------------------------------------------- History:  PMH: NSTEMI. Chest pain. Dyspnea. Coronary artery disease. Risk factors: Diabetes mellitus.  ------------------------------------------------------------------- Study Conclusions  - Left ventricle: There is hypokinesis of the mid anterolateral , anterior and distal lateral and anterior walls. The cavity size was normal. Systolic function was normal. The estimated ejection fraction was in the range of 50% to 55%. Wall motion was normal; there were no regional wall motion abnormalities. Features are consistent with a pseudonormal left ventricular filling pattern, with concomitant abnormal relaxation and increased filling pressure (grade 2 diastolic dysfunction). Doppler parameters are consistent with elevated ventricular end-diastolic filling pressure. - Aortic root: The aortic root was normal in size. - Mitral valve: There was mild to moderate regurgitation. - Left atrium: The atrium was moderately dilated. - Right ventricle: Systolic function was normal. - Tricuspid valve: Structurally normal valve. There was moderate regurgitation. - Pulmonic valve: There was no regurgitation. - Pulmonary arteries: Systolic pressure was moderately increased. PA peak pressure: 51 mm Hg (S). - Inferior vena cava: The vessel was normal in size. - Pericardium, extracardiac: There was no pericardial effusion.    Radiology/Studies  Dg Chest 2 View  01/03/2016  CLINICAL DATA:  82 year old male with chest pain EXAM: CHEST  2 VIEW COMPARISON:  Radiograph dated 12/18/2015 FINDINGS: Two views of the chest demonstrate stable minimal left lung base linear atelectasis. There is no focal consolidation, pleural effusion, or pneumothorax. Stable cardiac silhouette. Median sternotomy wires and CABG vascular clips. No acute osseous pathology. IMPRESSION: No active cardiopulmonary disease.  Electronically Signed   By: Anner Crete M.D.   On: 01/03/2016 03:02   Dg Chest 2 View  12/18/2015  CLINICAL DATA:  Pneumonia. EXAM: CHEST  2 VIEW COMPARISON:  November 17, 2015. FINDINGS: Stable cardiomediastinal silhouette. Status post coronary artery bypass graft. No pneumothorax or effusion is noted. Multilevel degenerative disc disease is noted in mid thoracic spine. Atherosclerosis thoracic aorta is noted. Right lung is clear. Mild left basilar subsegmental atelectasis is noted. IMPRESSION: Mild left basilar subsegmental atelectasis. Electronically Signed   By: Marijo Conception, M.D.   On: 12/18/2015 13:12   Nm Gi Blood Loss  01/10/2016  CLINICAL DATA:  GI hemorrhage and rectal bleeding EXAM: NUCLEAR MEDICINE GASTROINTESTINAL BLEEDING SCAN TECHNIQUE: Sequential abdominal images were obtained following intravenous administration of Tc-56mlabeled red blood cells. RADIOPHARMACEUTICALS:  25.1 mCi Tc-974mn-vitro labeled red cells. COMPARISON:  01/03/2016 FINDINGS: There are now areas of increased activity identified in the left mid abdomen which  demonstrate mobility and likely lie within the distal transverse colon and proximal descending colon consistent with acute hemorrhage. No other focal area of hemorrhage is seen. IMPRESSION: Areas of increased activity in the far lateral left abdomen likely related to the distal transverse and proximal descending colon. Electronically Signed   By: Inez Catalina M.D.   On: 01/10/2016 16:30   Nm Gi Blood Loss  01/03/2016  CLINICAL DATA:  History of GI diverticular bleed. GI bleeding. Dark blood in the stools noted 4 days ago. EXAM: NUCLEAR MEDICINE GASTROINTESTINAL BLEEDING SCAN TECHNIQUE: Sequential abdominal images were obtained following intravenous administration of Tc-105mlabeled red blood cells. RADIOPHARMACEUTICALS:  25.0 mCi Tc-966mn-vitro labeled red cells. COMPARISON:  09/13/2008 FINDINGS: Early level activity is identified. Delayed images are performed,  demonstrating no site of active bleeding. IMPRESSION: A site of active bleeding is not identified. Electronically Signed   By: ElNolon Nations.D.   On: 01/03/2016 16:27    ASSESSMENT AND PLAN  Mr. RiRehfeldts a pleasant 89ear old Caucasian male with past medical history of severe diverticulosis, multiple GI bleed since 1981, carotid artery disease s/p R CEA by Dr. EaDonnetta Hutching94, HTN, HLD, DM, CKD stage IV, CAD s/p CABG 1996 with redo 2006 by Dr. GeServando Snareand h/o PAF not on coumadin due to recurrent GI bleed presented with recurrent GI bleed after recent NSTEMI on medical management. Cardiology consulted for preop clearance.   1. Recurrent GI bleeding and severe pancolonic diverticulosis; day 2 s/p  left and sigmoid colon removal with extended open left hemicolectomy and sigmoidectomy 01/13/16 by  Dr. ArRalene Ok 2. CAD s/p CABG 1996 with redo 2006 by Dr. GeServando Snare Patient had recent h/o exertional angina, although current anemia can contribute to angina, he likely also has significant underlying coronary artery disease given persistent ST depression in the lateral leads on earlier ECGs. . Marland Kitchene also has anterior wall motion abnormality seen on recent echocardiogram despite having a stable EF of 50-55%.    ECG post op yesterday demonstrates  ST at 106 and slightly more pronounced ST depression.  Developed recurrent anginal symptoms last night. Will check troponins; repeat ECG today. Troponin now 12.06 c/w recurrent NSTEMI. Patient pain free on heparin and NTG. Will add bb.    3. Anemia: H/H 7.7/23.7 this am; was started on heparin by fellow who had spoken to Dr. WiRedmond PullingIf Hb drops further need to dc heparin. With angina consider transfusion with PRBC today.  4. CKD stage IV  5. Carotid artery disease s/p R CEA by Dr. EaDonnetta Hutching94  6. HTN 7. HLD 8. DM 9. h/o PAF not on coumadin due to recurrent GI bleed  With HR 100 will start lopressor at 25 mg bid now; increase NTG to 20 ug.  Plan to tx at least 1 unit PRBC; Repeat ECG, serial troponins. F/u H/H closely  ThTroy SineMD, FASan Antonio Endoscopy Center/01/2016 1:04 PM

## 2016-01-16 DIAGNOSIS — K2901 Acute gastritis with bleeding: Secondary | ICD-10-CM

## 2016-01-16 DIAGNOSIS — I214 Non-ST elevation (NSTEMI) myocardial infarction: Secondary | ICD-10-CM

## 2016-01-16 DIAGNOSIS — I208 Other forms of angina pectoris: Secondary | ICD-10-CM

## 2016-01-16 LAB — TYPE AND SCREEN
ABO/RH(D): O NEG
Antibody Screen: POSITIVE
DAT, IgG: NEGATIVE
Donor AG Type: NEGATIVE
Donor AG Type: NEGATIVE
UNIT DIVISION: 0
UNIT DIVISION: 0

## 2016-01-16 LAB — HEPARIN LEVEL (UNFRACTIONATED)
HEPARIN UNFRACTIONATED: 0.25 [IU]/mL — AB (ref 0.30–0.70)
HEPARIN UNFRACTIONATED: 0.34 [IU]/mL (ref 0.30–0.70)

## 2016-01-16 LAB — GLUCOSE, CAPILLARY
GLUCOSE-CAPILLARY: 150 mg/dL — AB (ref 65–99)
GLUCOSE-CAPILLARY: 151 mg/dL — AB (ref 65–99)
Glucose-Capillary: 226 mg/dL — ABNORMAL HIGH (ref 65–99)
Glucose-Capillary: 189 mg/dL — ABNORMAL HIGH (ref 65–99)

## 2016-01-16 LAB — CBC
HCT: 29 % — ABNORMAL LOW (ref 39.0–52.0)
HCT: 29.3 % — ABNORMAL LOW (ref 39.0–52.0)
HEMOGLOBIN: 9.3 g/dL — AB (ref 13.0–17.0)
Hemoglobin: 9.3 g/dL — ABNORMAL LOW (ref 13.0–17.0)
MCH: 29.2 pg (ref 26.0–34.0)
MCH: 29.3 pg (ref 26.0–34.0)
MCHC: 32.1 g/dL (ref 30.0–36.0)
MCHC: 31.7 g/dL (ref 30.0–36.0)
MCV: 92.1 fL (ref 78.0–100.0)
MCV: 91.5 fL (ref 78.0–100.0)
Platelets: 191 10*3/uL (ref 150–400)
Platelets: 206 10*3/uL (ref 150–400)
RBC: 3.17 MIL/uL — ABNORMAL LOW (ref 4.22–5.81)
RBC: 3.18 MIL/uL — ABNORMAL LOW (ref 4.22–5.81)
RDW: 14.5 % (ref 11.5–15.5)
RDW: 14.7 % (ref 11.5–15.5)
WBC: 11.8 10*3/uL — ABNORMAL HIGH (ref 4.0–10.5)
WBC: 13.9 10*3/uL — ABNORMAL HIGH (ref 4.0–10.5)

## 2016-01-16 LAB — COMPREHENSIVE METABOLIC PANEL
ALK PHOS: 71 U/L (ref 38–126)
ALT: 24 U/L (ref 17–63)
AST: 54 U/L — AB (ref 15–41)
Albumin: 2.6 g/dL — ABNORMAL LOW (ref 3.5–5.0)
Anion gap: 11 (ref 5–15)
BILIRUBIN TOTAL: 1.3 mg/dL — AB (ref 0.3–1.2)
BUN: 24 mg/dL — AB (ref 6–20)
CALCIUM: 8.4 mg/dL — AB (ref 8.9–10.3)
CO2: 24 mmol/L (ref 22–32)
CREATININE: 2.07 mg/dL — AB (ref 0.61–1.24)
Chloride: 105 mmol/L (ref 101–111)
GFR, EST AFRICAN AMERICAN: 33 mL/min — AB (ref >60)
GFR, EST NON AFRICAN AMERICAN: 28 mL/min — AB (ref >60)
Glucose, Bld: 237 mg/dL — ABNORMAL HIGH (ref 65–99)
Potassium: 3.6 mmol/L (ref 3.5–5.1)
Sodium: 140 mmol/L (ref 135–145)
TOTAL PROTEIN: 6.1 g/dL — AB (ref 6.5–8.1)

## 2016-01-16 LAB — TROPONIN I
TROPONIN I: 16 ng/mL — AB (ref <0.031)
Troponin I: 13.67 ng/mL (ref <0.031)
Troponin I: 11.25 ng/mL (ref <0.031)

## 2016-01-16 MED ORDER — METOPROLOL TARTRATE 50 MG PO TABS
50.0000 mg | ORAL_TABLET | Freq: Two times a day (BID) | ORAL | Status: DC
Start: 2016-01-16 — End: 2016-01-17
  Administered 2016-01-16 – 2016-01-17 (×2): 50 mg via ORAL
  Filled 2016-01-16 (×2): qty 1

## 2016-01-16 NOTE — Progress Notes (Signed)
Progress Note: General Surgery Service   Subjective: Pain moderate, able to ambulate 1x yesterday, no flatus but felt gas movement  Objective: Vital signs in last 24 hours: Temp:  [97.6 F (36.4 C)-100.5 F (38.1 C)] 99.8 F (37.7 C) (03/04 0405) Pulse Rate:  [86-100] 98 (03/04 0500) Resp:  [9-23] 22 (03/04 0500) BP: (110-133)/(57-70) 122/63 mmHg (03/04 0500) SpO2:  [93 %-100 %] 94 % (03/04 0500) Weight:  [83.5 kg (184 lb 1.4 oz)] 83.5 kg (184 lb 1.4 oz) (03/04 0405) Last BM Date: 01/13/16  Intake/Output from previous day: 03/03 0701 - 03/04 0700 In: 872 [I.V.:437; Blood:435] Out: 1925 [Urine:1925] Intake/Output this shift:    Lungs: CTAB  Cardiovascular: irreg, tachycardic  Abd: soft, ATTP, wound c/d/i, nondistended  Extremities: no edema  Neuro: AOx4  Lab Results: CBC   Recent Labs  01/15/16 2332 01/16/16 0527  WBC 13.9* 11.8*  HGB 9.3* 9.3*  HCT 29.0* 29.3*  PLT 191 206   BMET  Recent Labs  01/14/16 0435 01/15/16 0520  NA 141 143  K 3.6 3.8  CL 108 109  CO2 24 24  GLUCOSE 262* 152*  BUN 23* 24*  CREATININE 2.05* 1.94*  CALCIUM 8.4* 8.7*   PT/INR No results for input(s): LABPROT, INR in the last 72 hours. ABG No results for input(s): PHART, HCO3 in the last 72 hours.  Invalid input(s): PCO2, PO2  Studies/Results:  Anti-infectives: Anti-infectives    Start     Dose/Rate Route Frequency Ordered Stop   01/14/16 0800  cefoTEtan (CEFOTAN) 2 g in dextrose 5 % 50 mL IVPB     2 g 100 mL/hr over 30 Minutes Intravenous  Once 01/13/16 1755 01/14/16 0930   01/13/16 1415  cefoTEtan (CEFOTAN) 2 g in dextrose 5 % 50 mL IVPB  Status:  Discontinued     2 g 100 mL/hr over 30 Minutes Intravenous Every 12 hours 01/13/16 1412 01/13/16 1718   01/13/16 1012  cefoTEtan in Dextrose 5% (CEFOTAN) 2-2.08 GM-% IVPB    Comments:  Leandrew Koyanagi   : cabinet override      01/13/16 1012 01/13/16 2229   01/13/16 0600  cefoTEtan (CEFOTAN) 2 g in dextrose 5 % 50  mL IVPB     2 g 100 mL/hr over 30 Minutes Intravenous On call to O.R. 01/12/16 1307 01/13/16 1112   01/12/16 1400  neomycin (MYCIFRADIN) tablet 1,000 mg     1,000 mg Oral 3 times per day 01/12/16 1307 01/12/16 2332   01/12/16 1400  erythromycin (E-MYCIN) tablet 1,000 mg     1,000 mg Oral 3 times per day 01/12/16 1307 01/12/16 2332      Medications: Scheduled Meds: . sodium chloride   Intravenous Once  . alvimopan  12 mg Oral BID  . aspirin EC  81 mg Oral Daily  . HYDROmorphone   Intravenous 6 times per day  . insulin aspart  0-15 Units Subcutaneous TID WC  . insulin aspart  0-5 Units Subcutaneous QHS  . isosorbide mononitrate  60 mg Oral BID WC  . metoprolol  2.5 mg Intravenous 4 times per day  . metoprolol tartrate  25 mg Oral BID  . pantoprazole (PROTONIX) IV  40 mg Intravenous Q12H   Continuous Infusions: . heparin 950 Units/hr (01/16/16 [id number])  . nitroGLYCERIN 20 mcg/min (01/16/16 0600)   PRN Meds:.diphenhydrAMINE **OR** diphenhydrAMINE, naloxone **AND** sodium chloride flush, ondansetron **OR** ondansetron (ZOFRAN) IV  Assessment/Plan: Patient Active Problem List   Diagnosis Date Noted  . Rectal bleeding   .  CAD in native artery   . Palliative care encounter   . DNR (do not resuscitate) discussion   . History of GI diverticular bleed   . Diabetes mellitus ([place]) 01/03/2016  . Diastolic CHF ([place]) [id number]  . Atrial fibrillation ([place]) 01/03/2016  . Angina at rest [facility])   . Chronic combined systolic and diastolic CHF (congestive heart failure) ([place])   . Pulmonary hypertension ([place])   . CKD (chronic kidney disease), stage III   . Pneumonia 12/01/2015  . Acute on chronic diastolic (congestive) heart failure ([place]) 11/20/2015  . Hypokalemia 11/18/2015  . HCAP (healthcare-associated pneumonia) 11/17/2015  . NSTEMI (non-ST elevated myocardial infarction) ([place])   . Acute GI bleeding   . Diverticulosis of colon with hemorrhage   . GI bleed 11/13/2015  . Symptomatic  anemia 11/13/2015  . Acute-on-chronic kidney injury ([place])   . Acute blood loss anemia   . Tremor 09/01/2015  . Gout 09/09/2014  . Medicare annual wellness visit, initial 09/09/2014  . Advance care planning 09/09/2014  . Ejection fraction   . Heme positive stool 06/19/2014  . Long-term (current) use of anticoagulants   . RBBB (right bundle branch block)   . Carotid artery disease ([place])   . CAD (coronary artery disease)   . Hx of CABG   . History of gastrointestinal bleeding   . Mitral regurgitation   . ED (erectile dysfunction) 03/01/2012  . CKD (chronic kidney disease) stage 3, GFR 30-59 ml/min 03/24/2011  . Atrial flutter ([place]) 08/24/2010  . Paroxysmal atrial fibrillation ([place]) 08/09/2010  . Peripheral sensory neuropathy due to type 2 diabetes mellitus ([place])   . GERD   . Sleep apnea   . Type 2 diabetes mellitus with peripheral neuropathy ([name])   . Hyperlipidemia   . Hypertensive heart disease    s/p Procedure(s): EXPLORATORY LAPAROTOMY, LEFT AND SIGMOID COLON REMOVAL 01/13/2016 Post op MI. Had anemia treated with 2 pRBC yesterday with appropriate rise from 7.7 to 9.3 -continue clears -f/u card recs -hep drip for NSTEMI -ambulate with assistance   LOS: 13 days   Mickeal Skinner, MD Pg# [phone] [facility]

## 2016-01-16 NOTE — Evaluation (Signed)
Physical Therapy Evaluation Patient Details Name: [name] MRN: [id number] DOB: 13-Nov-1932 Today's Date: 01/16/2016   History of Present Illness  Pt presents for recurrent GI bleed after NSTEMI in 1/17, underwent ex lap on 3/3 with troponin trending up after surgery, has extensive cardiac history.  Clinical Impression  Pt admitted with above diagnosis. Pt currently with functional limitations due to the deficits listed below (see PT Problem List). Pt reports that since procedure, he has had decreased dorsiflexion left ankle as well as visual changes. Decreased df is affecting ambulation and safety. Pt ambulated 15' with RW and min A +2 for lines.  Pt will benefit from skilled PT to increase their independence and safety with mobility to allow discharge to the venue listed below.      Follow Up Recommendations SNF;Supervision/Assistance - 24 hour    Equipment Recommendations  Rolling walker with 5" wheels    Recommendations for Other Services OT consult     Precautions / Restrictions Precautions Precautions: Fall Precaution Comments: pt with new left foot drop, abdominal incision Restrictions Weight Bearing Restrictions: No      Mobility  Bed Mobility Overal bed mobility: Needs Assistance Bed Mobility: Supine to Sit     Supine to sit: Supervision     General bed mobility comments: pt able to rise to EOB with use of rail  Transfers Overall transfer level: Needs assistance Equipment used: Rolling walker (2 wheeled) Transfers: Sit to/from Stand Sit to Stand: Min assist         General transfer comment: min A to steady and give support and pt with slow mvmt and difficulty fully extending knees.   Ambulation/Gait Ambulation/Gait assistance: Min assist;+2 safety/equipment Ambulation Distance (Feet): 15 Feet Assistive device: Rolling walker (2 wheeled) Gait Pattern/deviations: Decreased dorsiflexion - left;Decreased stride length;Shuffle;Trunk flexed Gait velocity:  decreased Gait velocity interpretation: <1.8 ft/sec, indicative of risk for recurrent falls General Gait Details: noted shuffle on left with decreased df, fatigued very quickly  Stairs            Wheelchair Mobility    Modified Rankin (Stroke Patients Only)       Balance Overall balance assessment: Needs assistance Sitting-balance support: No upper extremity supported Sitting balance-Leahy Scale: Fair     Standing balance support: Bilateral upper extremity supported Standing balance-Leahy Scale: Poor                               Pertinent Vitals/Pain Pain Assessment: Faces Faces Pain Scale: Hurts even more Pain Location: abdominal pain Pain Descriptors / Indicators: Sore Pain Intervention(s): Limited activity within patient's tolerance;Monitored during session;PCA encouraged         HR 108 bpm at rest, 122 bpm with ambulation        O2 sats 98% on RA  Home Living Family/patient expects to be discharged to:: Private residence Living Arrangements: Spouse/significant other Available Help at Discharge: Family;Available 24 hours/day Type of Home: House Home Access: Stairs to enter   [employer] of Steps: 3 Home Layout: One level (but 1 step into kitchen) Home Equipment: None      Prior Function Level of Independence: Independent         Comments: lives with girlfriend     Hand Dominance   Dominant Hand: Right    Extremity/Trunk Assessment   Upper Extremity Assessment: Defer to OT evaluation           Lower Extremity Assessment: RLE deficits/detail;LLE  deficits/detail RLE Deficits / Details: difficult to fully assess because MMT causes abdominal pain so pt cannot give full effort, but appears to be generally 4/5 strength on right LE LLE Deficits / Details: also causes abdominal discomfort but noted decrease in strength from right side, especially hip flex, hamstring, and ankle df: pt cannot actively df past neutral, this is  new for him  Cervical / Trunk Assessment: Kyphotic  Communication   Communication: HOH  Cognition Arousal/Alertness: Awake/alert Behavior During Therapy: WFL for tasks assessed/performed Overall Cognitive Status: Within Functional Limits for tasks assessed                      General Comments General comments (skin integrity, edema, etc.): along with LLE weakness, pt reports visual changes from his baseline macular degeneration    Exercises        Assessment/Plan    PT Assessment Patient needs continued PT services  PT Diagnosis Difficulty walking;Abnormality of gait;Generalized weakness;Acute pain   PT Problem List Decreased strength;Decreased range of motion;Decreased activity tolerance;Decreased balance;Decreased mobility;Decreased knowledge of use of DME;Decreased knowledge of precautions;Pain;Decreased coordination  PT Treatment Interventions     PT Goals (Current goals can be found in the Care Plan section) Acute Rehab PT Goals Patient Stated Goal: get stronger PT Goal Formulation: With patient Time For Goal Achievement: 01/30/16 Potential to Achieve Goals: Good    Frequency Min 3X/week   Barriers to discharge        Co-evaluation               End of Session Equipment Utilized During Treatment: Gait belt Activity Tolerance: Patient tolerated treatment well Patient left: in chair;with call bell/phone within reach;with nursing/sitter in room Nurse Communication: Mobility status;Other (comment) (L side weakness)         Time: [id number] PT Time Calculation (min) (ACUTE ONLY): 36 min   Charges:   PT Evaluation $PT Eval Moderate Complexity: 1 Procedure PT Treatments $Gait Training: 8-22 mins   PT G Codes:       Leighton Roach, PT  Acute Rehab Services  [place], [place] 01/16/2016, 1:55 PM

## 2016-01-16 NOTE — Progress Notes (Signed)
PROGRESS NOTE  [name] [id number] DOB: [birth year] DOA: 01/03/2016 PCP: [name], MD Outpatient Specialists:    LOS: 13 days   Brief Narrative: [name] is an 82 year old male who was admitted on 01/03/2016 with chest pain and BRBPR x 3-4 days. History of diverticular bleeding with surgical intervention. Patient underwent full evaluation by gastroenterology, he has ongoing bleeding, Gen. surgery was consulted and patient will be taken to the operating room on 3/1. Cardiology was consulted for preop clearance given ongoing chest pains.  Assessment & Plan: Principal Problem:   GI bleed Active Problems:   CKD (chronic kidney disease) stage 3, GFR 30-59 ml/min   Acute blood loss anemia   Acute on chronic diastolic (congestive) heart failure ([name])   Diabetes mellitus ([name])   Diastolic CHF ([name])   Atrial fibrillation ([name])   Chronic combined systolic and diastolic CHF (congestive heart failure) ([name])   Pulmonary hypertension ([name])   CKD (chronic kidney disease), stage III   History of GI diverticular bleed   Palliative care encounter   DNR (do not resuscitate) discussion   Rectal bleeding   CAD in native artery   Acute GI bleed/Acute blood loss anemia - Recurrent diverticular bleed despite cessation of Brilinta 5-6 weeks ago. - Colonoscopy 01/05/2016 revealed diverticulosis: severe in the left colon, moderate in the transverse colon and mild in the right colon. No evidence of active bleeding or old blood. - Nuclear RBC scan 01/10/2016 revealed increased activity in the left mid-abd, likely the distal transverse colon and proximal descending colon consistent with acute hemorrhage. - Cardiology cleared patient for surgery without further testing; patient is a high-risk surgical candidate - Suspected LGIB from diverticuli;open left hemicolectomy and sigmoidectomy  3/1 - received total 4U pRBC prior to surgery, 2 units. NG tube discontinued, currently on a clear liquid  diet Hemoglobin change from 9.2> 7.7, > 9.3 No recurrent bleeding Hospital course complicated by non-ST elevation MI postoperatively Pain management by surgery-on a Dilaudid PCA   Acute blood loss anemia Status post transfusion of 6 units of packed red blood cells this admission, Follow CBC closely, goal hemoglobin greater than 9.0 in the setting of non-ST elevation MI  CKD stage 3 - Appears euvolemic. Cr stable. Follow.   Chronic combined systolic and diastolic CHF/Acute on chronic diastolic (congestive) heart failure - Appears euvolemic - Cardiology following  Chest pain with coronary artery disease as well as NSTEMI. Now with suspected recurrent non-ST elevation MI ,CAD s/p CABG 1996 with redo 2006 by [name].  likely also has significant underlying coronary artery disease given persistent ST depression in the lateral leads, has anterior wall motion abnormality seen on recent echocardiogram despite having a stable EF of 50-55%. ECG post op yesterday with ST at 106 and slightly more pronounced ST depression. Developed recurrent anginal symptoms and found to have recurrent non-ST elevation MI postoperatively Managed by cardiology with  metoprolol, nitroglycerin, heparin,   pending decision from cardiology about a cardiac cath    Diabetes mellitus - HgbA1c: 6.0 on arrival - Continue SSI  History of Atrial fibrillation  - No anticoagulation due to recurrent GIB - Current NSR  Essential hypertension - Blood pressure well controlled.  - Continue metoprolol   carotid artery disease s/p R CEA by [name] '94  DVT prophylaxis: SCDs Code Status: Full Family Communication: Patient's daughter is by the bedside  Disposition Plan: Critically ill, continue stepdown,    Barriers for discharge: LGIB  Consultants:   Gastroenterology  Cardiology  Surgery  Palliative   Procedures:   Colonoscopy 01/05/2016  Antimicrobials:  None   Subjective: no flatus but  felt gas movement, no chest pain this morning   Objective: Filed Vitals:   01/16/16 0400 01/16/16 0405 01/16/16 0500 01/16/16 0700  BP: 110/60 110/60 122/63 103/62  Pulse: 94 93 98 97  Temp:  99.8 F (37.7 C)    TempSrc:  Oral    Resp: 21 23 22 22   Height:      Weight:  83.5 kg (184 lb 1.4 oz)    SpO2: 94% 94% 94% 94%    Intake/Output Summary (Last 24 hours) at 01/16/16 0817 Last data filed at 01/16/16 0600  Gross per 24 hour  Intake    872 ml  Output   1800 ml  Net   -928 ml   Filed Weights   01/14/16 0751 01/15/16 0030 01/16/16 0405  Weight: 81 kg (178 lb 9.2 oz) 80 kg (176 lb 5.9 oz) 83.5 kg (184 lb 1.4 oz)   Examination: BP 103/62 mmHg  Pulse 97  Temp(Src) 99.8 F (37.7 C) (Oral)  Resp 22  Ht 5\' 9"  (1.753 m)  Wt 83.5 kg (184 lb 1.4 oz)  BMI 27.17 kg/m2  SpO2 94%   General: Pleasant, NAD. Currently pain free Neuro: Alert and oriented X 3. Moves all extremities spontaneously. Psych: Normal affect. HEENT: Normal Neck: Supple without bruits or JVD. Lungs: Resp regular and unlabored, CTA. Heart: RRR 1-2/6 sem no s3. Abdomen: Midline incision noted, covered with clear dressing, no obvious active bleeding, some dried blood. No bowel sound Extremities: No clubbing, cyanosis or edema. DP/PT/Radials 2+ and equal bilaterally.  Data Reviewed: I have personally reviewed following labs and imaging studies  CBC:  Recent Labs Lab 01/10/16 0042 01/10/16 0634  01/14/16 0435 01/14/16 2315 01/15/16 0520 01/15/16 0950 01/15/16 2332 01/16/16 0527  WBC 7.2 7.3  < > 13.6* 17.1* 15.8*  --  13.9* 11.8*  NEUTROABS 4.3 4.6  --   --   --   --   --   --   --   HGB 8.0* 8.0*  < > 8.2* 8.6* 7.8* 7.7* 9.3* 9.3*  HCT 25.3* 25.1*  < > 25.5* 27.5* 24.4* 23.7* 29.0* 29.3*  MCV 91.3 91.3  < > 90.1 91.7 91.7  --  91.5 92.1  PLT 191 194  < > 199 204 194  --  191 206  < > = values in this interval not displayed. Basic Metabolic Panel:  Recent Labs Lab 01/13/16 0613  01/14/16 0435 01/15/16 0520  NA 141 141 143  K 3.6 3.6 3.8  CL 102 108 109  CO2 28 24 24   GLUCOSE 161* 262* 152*  BUN 28* 23* 24*  CREATININE 1.83* 2.05* 1.94*  CALCIUM 8.9 8.4* 8.7*  MG 2.1 1.9  --    GFR: Estimated Creatinine Clearance: 29.9 mL/min (by C-G formula based on Cr of 1.94). Liver Function Tests:  Recent Labs Lab 01/15/16 0520  AST 63*  ALT 23  ALKPHOS 63  BILITOT 0.7  PROT 5.7*  ALBUMIN 2.7*   No results for input(s): LIPASE, AMYLASE in the last 168 hours. No results for input(s): AMMONIA in the last 168 hours. Coagulation Profile:  Recent Labs Lab 01/13/16 0613  INR 1.18   Cardiac Enzymes:  Recent Labs Lab 01/14/16 2315 01/15/16 0520 01/15/16 0950 01/15/16 2332 01/16/16 0527  TROPONINI 5.24* 8.73* 12.06* 16.00* 13.67*   BNP (last 3 results) No results for input(s): PROBNP  in the last 8760 hours. HbA1C: No results for input(s): HGBA1C in the last 72 hours. CBG:  Recent Labs Lab 01/14/16 2103 01/15/16 0809 01/15/16 1146 01/15/16 1713 01/15/16 2150  GLUCAP 143* 127* 138* 137* 166*   Lipid Profile: No results for input(s): CHOL, HDL, LDLCALC, TRIG, CHOLHDL, LDLDIRECT in the last 72 hours. Thyroid Function Tests: No results for input(s): TSH, T4TOTAL, FREET4, T3FREE, THYROIDAB in the last 72 hours. Anemia Panel: No results for input(s): VITAMINB12, FOLATE, FERRITIN, TIBC, IRON, RETICCTPCT in the last 72 hours. Urine analysis:    Component Value Date/Time   COLORURINE YELLOW 11/14/2015 [place] 11/14/2015 0241   LABSPEC 1.018 11/14/2015 0241   PHURINE 5.0 11/14/2015 0241   GLUCOSEU NEGATIVE 11/14/2015 0241   HGBUR NEGATIVE 11/14/2015 0241   HGBUR negative 09/12/2007 1028   BILIRUBINUR NEGATIVE 11/14/2015 0241   KETONESUR NEGATIVE 11/14/2015 0241   PROTEINUR NEGATIVE 11/14/2015 0241   UROBILINOGEN 0.2 06/14/2014 2210   NITRITE NEGATIVE 11/14/2015 0241   LEUKOCYTESUR NEGATIVE 11/14/2015 0241   Sepsis  Labs: Invalid input(s): PROCALCITONIN, LACTICIDVEN  Recent Results (from the past 240 hour(s))  Surgical pcr screen     Status: None   Collection Time: 01/13/16  6:19 AM  Result Value Ref Range Status   MRSA, PCR NEGATIVE NEGATIVE Final   Staphylococcus aureus NEGATIVE NEGATIVE Final    Comment:        The Xpert SA Assay (FDA approved for NASAL specimens in patients over 74 years of age), is one component of a comprehensive surveillance program.  Test performance has been validated by [facility] for patients greater than or equal to 97 year old. It is not intended to diagnose infection nor to guide or monitor treatment.       Radiology Studies: No results found.   Scheduled Meds: . sodium chloride   Intravenous Once  . alvimopan  12 mg Oral BID  . aspirin EC  81 mg Oral Daily  . HYDROmorphone   Intravenous 6 times per day  . insulin aspart  0-15 Units Subcutaneous TID WC  . insulin aspart  0-5 Units Subcutaneous QHS  . isosorbide mononitrate  60 mg Oral BID WC  . metoprolol  2.5 mg Intravenous 4 times per day  . metoprolol tartrate  25 mg Oral BID  . pantoprazole (PROTONIX) IV  40 mg Intravenous Q12H   Continuous Infusions: . heparin 950 Units/hr (01/16/16 [id number])  . nitroGLYCERIN 20 mcg/min (01/16/16 0600)  None.   Reyne Dumas , MD,  [facility] Pager [phone]  If 7PM-7AM, please contact night-coverage [email or web address] Password [facility] 01/16/2016, 8:17 AM

## 2016-01-16 NOTE — Progress Notes (Addendum)
ANTICOAGULATION CONSULT NOTE - Initial Consult  Pharmacy Consult for Heparin  Indication: chest pain/ACS  No Known Allergies  Patient Measurements: Height: 5\' 9"  (175.3 cm) Weight: 184 lb 1.4 oz (83.5 kg) IBW/kg (Calculated) : 70.7  Vital Signs: Temp: 99.8 F (37.7 C) (03/04 0405) Temp Source: Oral (03/04 0405) BP: 110/60 mmHg (03/04 0405) Pulse Rate: 93 (03/04 0405)  Labs:  Recent Labs  01/14/16 0435  01/15/16 0520 01/15/16 0950 01/15/16 2332 01/16/16 0527  HGB 8.2*  < > 7.8* 7.7* 9.3* 9.3*  HCT 25.5*  < > 24.4* 23.7* 29.0* 29.3*  PLT 199  < > 194  --  191 206  HEPARINUNFRC  --   --   --  0.29*  --  0.25*  CREATININE 2.05*  --  1.94*  --   --   --   TROPONINI  --   < > 8.73* 12.06* 16.00* 13.67*  < > = values in this interval not displayed.  Estimated Creatinine Clearance: 29.9 mL/min (by C-G formula based on Cr of 1.94).  Assessment: Patient is POD #3 from hemicolectomy/sigmoidectomy after having recurrent lower GI bleeds, pt is now having significant cardiac issues and has a troponin of 13.7 (appears to have peaked at 16), cardiology has discussed with surgery and they are ok with heparin at this time. Hgb is 9.3 (fairly stable post-operatively). Noted renal dysfunction. Other labs reviewed.   Goal of Therapy:  Heparin level 0.3-0.5 units/ml, with hx GIB Monitor platelets by anticoagulation protocol: Yes   Plan:  -Inc heparin to 950 units/hr -1500 HL -Daily CBC/HL -Monitor for bleeding very closely, trend Hgb  Narda Bonds 01/16/2016,6:43 AM  Addendum: Heparin level is now therapeutic at 0.34. Continue heparin at 950 units/hr and follow up AM labs.  Nena Jordan, PharmD, BCPS 01/16/2016, 5:31 PM

## 2016-01-16 NOTE — Progress Notes (Signed)
Patient Name: [name] Date of Encounter: 01/16/2016  Primary Cardiologist: Dr. Gwenlyn Found (formerly Dr. [name])   Principal Problem:   GI bleed Active Problems:   CKD (chronic kidney disease) stage 3, GFR 30-59 ml/min   Acute blood loss anemia   Acute on chronic diastolic (congestive) heart failure ([name])   Diabetes mellitus ([name])   Diastolic CHF ([name])   Atrial fibrillation ([name])   Chronic combined systolic and diastolic CHF (congestive heart failure) ([place])   Pulmonary hypertension ([name])   CKD (chronic kidney disease), stage III   History of GI diverticular bleed   Palliative care encounter   DNR (do not resuscitate) discussion   Rectal bleeding   CAD in native artery    SUBJECTIVE  Further anginal symptoms. Feeling better. Still on nitroglycerin drip, heparin drip  Scheduled Meds: . sodium chloride   Intravenous Once  . alvimopan  12 mg Oral BID  . aspirin EC  81 mg Oral Daily  . HYDROmorphone   Intravenous 6 times per day  . insulin aspart  0-15 Units Subcutaneous TID WC  . insulin aspart  0-5 Units Subcutaneous QHS  . isosorbide mononitrate  60 mg Oral BID WC  . metoprolol  2.5 mg Intravenous 4 times per day  . metoprolol tartrate  25 mg Oral BID  . pantoprazole (PROTONIX) IV  40 mg Intravenous Q12H   Continuous Infusions: . heparin 950 Units/hr (01/16/16 4142)  . nitroGLYCERIN 10 mcg/min (01/16/16 1047)   PRN Meds:.diphenhydrAMINE **OR** diphenhydrAMINE, naloxone **AND** sodium chloride flush, ondansetron **OR** ondansetron (ZOFRAN) IV  OBJECTIVE  Filed Vitals:   01/16/16 1100 01/16/16 1200 01/16/16 1306 01/16/16 1307  BP: 112/58 114/61 125/64   Pulse: 97 85    Temp:      TempSrc:      Resp: '13 16  18  '$ Height:      Weight:      SpO2: 99% 100%  97%    Intake/Output Summary (Last 24 hours) at 01/16/16 1332 Last data filed at 01/16/16 1200  Gross per 24 hour  Intake  957.9 ml  Output   1650 ml  Net -692.1 ml   Filed Weights   01/14/16 0751  01/15/16 0030 01/16/16 0405  Weight: 178 lb 9.2 oz (81 kg) 176 lb 5.9 oz (80 kg) 184 lb 1.4 oz (83.5 kg)    PHYSICAL EXAM  General: Pleasant, NAD. Currently pain free Neuro: Alert and oriented X 3. Moves all extremities spontaneously. Psych: Normal affect. HEENT:  Normal  Neck: Supple without bruits or JVD. Lungs:  Resp regular and unlabored, CTA. Heart: RRR  1-2/6 sem no s3. Abdomen: Midline incision noted. No bowel sound Extremities: No clubbing, cyanosis or edema. DP/PT/Radials 2+ and equal bilaterally.  Accessory Clinical Findings  CBC  Recent Labs  01/15/16 2332 01/16/16 0527  WBC 13.9* 11.8*  HGB 9.3* 9.3*  HCT 29.0* 29.3*  MCV 91.5 92.1  PLT 191 206   BMP Latest Ref Rng 01/16/2016 01/15/2016 01/14/2016  Glucose 65 - 99 mg/dL 237(H) 152(H) 262(H)  BUN 6 - 20 mg/dL 24(H) 24(H) 23(H)  Creatinine 0.61 - 1.24 mg/dL 2.07(H) 1.94(H) 2.05(H)  Sodium 135 - 145 mmol/L 140 143 141  Potassium 3.5 - 5.1 mmol/L 3.6 3.8 3.6  Chloride 101 - 111 mmol/L 105 109 108  CO2 22 - 32 mmol/L '24 24 24  '$ Calcium 8.9 - 10.3 mg/dL 8.4(L) 8.7(L) 8.4(L)   Hepatic Function Latest Ref Rng 01/16/2016 01/15/2016 01/09/2016  Total Protein 6.5 - 8.1  g/dL 6.1(L) 5.7(L) 6.2(L)  Albumin 3.5 - 5.0 g/dL 2.6(L) 2.7(L) 3.2(L)  AST 15 - 41 U/L 54(H) 63(H) 23  ALT 17 - 63 U/L '24 23 20  '$ Alk Phosphatase 38 - 126 U/L 71 63 60  Total Bilirubin 0.3 - 1.2 mg/dL 1.3(H) 0.7 0.7   CBC Latest Ref Rng 01/16/2016 01/15/2016 01/15/2016  WBC 4.0 - 10.5 K/uL 11.8(H) 13.9(H) -  Hemoglobin 13.0 - 17.0 g/dL 9.3(L) 9.3(L) 7.7(L)  Hematocrit 39.0 - 52.0 % 29.3(L) 29.0(L) 23.7(L)  Platelets 150 - 400 K/uL 206 191 -   Lab Results  Component Value Date   MCV 92.1 01/16/2016   MCV 91.5 01/15/2016   MCV 91.7 01/15/2016   Troponin (Point of Care Test) No results for input(s): TROPIPOC in the last 72 hours.    TELE Atrial fibrillation heart rate of 100     3/2 post op ECG (independently read by me): ST at 106; RBBB slight ly more  pronounced ST depression    Echocardiogram 11/17/2015  LV EF: 50% -  55%  ------------------------------------------------------------------- Indications:   Fever 780.6.  ------------------------------------------------------------------- History:  PMH: NSTEMI. Chest pain. Dyspnea. Coronary artery disease. Risk factors: Diabetes mellitus.  ------------------------------------------------------------------- Study Conclusions  - Left ventricle: There is hypokinesis of the mid anterolateral , anterior and distal lateral and anterior walls. The cavity size was normal. Systolic function was normal. The estimated ejection fraction was in the range of 50% to 55%. Wall motion was normal; there were no regional wall motion abnormalities. Features are consistent with a pseudonormal left ventricular filling pattern, with concomitant abnormal relaxation and increased filling pressure (grade 2 diastolic dysfunction). Doppler parameters are consistent with elevated ventricular end-diastolic filling pressure. - Aortic root: The aortic root was normal in size. - Mitral valve: There was mild to moderate regurgitation. - Left atrium: The atrium was moderately dilated. - Right ventricle: Systolic function was normal. - Tricuspid valve: Structurally normal valve. There was moderate regurgitation. - Pulmonic valve: There was no regurgitation. - Pulmonary arteries: Systolic pressure was moderately increased. PA peak pressure: 51 mm Hg (S). - Inferior vena cava: The vessel was normal in size. - Pericardium, extracardiac: There was no pericardial effusion.    Radiology/Studies  Dg Chest 2 View  01/03/2016  CLINICAL DATA:  82 year old male with chest pain EXAM: CHEST  2 VIEW COMPARISON:  Radiograph dated 12/18/2015 FINDINGS: Two views of the chest demonstrate stable minimal left lung base linear atelectasis. There is no focal consolidation, pleural effusion, or  pneumothorax. Stable cardiac silhouette. Median sternotomy wires and CABG vascular clips. No acute osseous pathology. IMPRESSION: No active cardiopulmonary disease. Electronically Signed   By: Anner Crete M.D.   On: 01/03/2016 03:02   Dg Chest 2 View  12/18/2015  CLINICAL DATA:  Pneumonia. EXAM: CHEST  2 VIEW COMPARISON:  November 17, 2015. FINDINGS: Stable cardiomediastinal silhouette. Status post coronary artery bypass graft. No pneumothorax or effusion is noted. Multilevel degenerative disc disease is noted in mid thoracic spine. Atherosclerosis thoracic aorta is noted. Right lung is clear. Mild left basilar subsegmental atelectasis is noted. IMPRESSION: Mild left basilar subsegmental atelectasis. Electronically Signed   By: Marijo Conception, M.D.   On: 12/18/2015 13:12   Nm Gi Blood Loss  01/10/2016  CLINICAL DATA:  GI hemorrhage and rectal bleeding EXAM: NUCLEAR MEDICINE GASTROINTESTINAL BLEEDING SCAN TECHNIQUE: Sequential abdominal images were obtained following intravenous administration of Tc-78mlabeled red blood cells. RADIOPHARMACEUTICALS:  25.1 mCi Tc-92mn-vitro labeled red cells. COMPARISON:  01/03/2016 FINDINGS: There  are now areas of increased activity identified in the left mid abdomen which demonstrate mobility and likely lie within the distal transverse colon and proximal descending colon consistent with acute hemorrhage. No other focal area of hemorrhage is seen. IMPRESSION: Areas of increased activity in the far lateral left abdomen likely related to the distal transverse and proximal descending colon. Electronically Signed   By: Inez Catalina M.D.   On: 01/10/2016 16:30   Nm Gi Blood Loss  01/03/2016  CLINICAL DATA:  History of GI diverticular bleed. GI bleeding. Dark blood in the stools noted 4 days ago. EXAM: NUCLEAR MEDICINE GASTROINTESTINAL BLEEDING SCAN TECHNIQUE: Sequential abdominal images were obtained following intravenous administration of Tc-46mlabeled red blood cells.  RADIOPHARMACEUTICALS:  25.0 mCi Tc-96mn-vitro labeled red cells. COMPARISON:  09/13/2008 FINDINGS: Early level activity is identified. Delayed images are performed, demonstrating no site of active bleeding. IMPRESSION: A site of active bleeding is not identified. Electronically Signed   By: ElNolon Nations.D.   On: 01/03/2016 16:27    ASSESSMENT AND PLAN  Mr. RiWerbers a pleasant [age]ear old Caucasian male with past medical history of severe diverticulosis, multiple GI bleed since 1981, carotid artery disease s/p R CEA by Dr. EaDonnetta Hutching94, HTN, HLD, DM, CKD stage IV, CAD s/p CABG 1996 with redo 2006 by Dr. GeServando Snareand h/o PAF not on coumadin due to recurrent GI bleed presented with recurrent GI bleed after recent NSTEMI on medical management.    Recurrent GI bleeding and severe pancolonic diverticulosis;  s/p  left and sigmoid colon removal with extended open left hemicolectomy and sigmoidectomy 01/13/16 by  Dr. ArRalene OkCurrently states that he has the urge to have a bowel movement. No further bleeding.  Non-ST elevation myocardial infarction-CAD s/p CABG 1996 with redo 2006 by Dr. GeServando Snare Patient had recent h/o exertional angina, although current anemia can contribute to angina, he likely also has significant underlying coronary artery disease given persistent ST depression in the lateral leads on earlier ECGs. . Marland Kitchene also has anterior wall motion abnormality seen on recent echocardiogram despite having a stable EF of 50-55%.     ECG post op demonstrated  ST at 106 and slightly more pronounced ST depression.  Developed recurrent anginal which have subsequently stopped. Troponin peak 16 now decreasing  c/w recurrent NSTEMI. Patient pain free on heparin and NTG drip.  --We will wean off nitroglycerin --Continue isosorbide 60 mg --Increase metoprolol to 50 mg twice a day --Continue IV heparin today to continue aggressive medical management for non-ST elevation myocardial  infarction --At this time, no cardiac catheterization. Continue with aggressive medical management. Chest pain-free.   Anemia: Stable hemoglobin 9.3  CKD stage IV-creatinine 2.07  Carotid artery disease s/p R CEA by Dr. EaDonnetta Hutching94  HTN HLD DM Paroxysmal atrial fibrillation - not on coumadin due to recurrent GI bleed -Increasing metoprolol for better rate control  SKCandee FurbishMD  01/16/2016 1:32 PM

## 2016-01-17 LAB — CBC
HCT: 29.3 % — ABNORMAL LOW (ref 39.0–52.0)
HEMATOCRIT: 30.6 % — AB (ref 39.0–52.0)
Hemoglobin: 9.3 g/dL — ABNORMAL LOW (ref 13.0–17.0)
Hemoglobin: 9.7 g/dL — ABNORMAL LOW (ref 13.0–17.0)
MCH: 29 pg (ref 26.0–34.0)
MCH: 29 pg (ref 26.0–34.0)
MCHC: 31.7 g/dL (ref 30.0–36.0)
MCHC: 31.7 g/dL (ref 30.0–36.0)
MCV: 91.3 fL (ref 78.0–100.0)
MCV: 91.6 fL (ref 78.0–100.0)
PLATELETS: 230 10*3/uL (ref 150–400)
PLATELETS: 235 10*3/uL (ref 150–400)
RBC: 3.21 MIL/uL — AB (ref 4.22–5.81)
RBC: 3.34 MIL/uL — ABNORMAL LOW (ref 4.22–5.81)
RDW: 14.9 % (ref 11.5–15.5)
RDW: 14.9 % (ref 11.5–15.5)
WBC: 10.1 10*3/uL (ref 4.0–10.5)
WBC: 10.3 10*3/uL (ref 4.0–10.5)

## 2016-01-17 LAB — GLUCOSE, CAPILLARY
GLUCOSE-CAPILLARY: 190 mg/dL — AB (ref 65–99)
Glucose-Capillary: 169 mg/dL — ABNORMAL HIGH (ref 65–99)
Glucose-Capillary: 156 mg/dL — ABNORMAL HIGH (ref 65–99)
Glucose-Capillary: 159 mg/dL — ABNORMAL HIGH (ref 65–99)

## 2016-01-17 LAB — HEPARIN LEVEL (UNFRACTIONATED): Heparin Unfractionated: 0.43 IU/mL (ref 0.30–0.70)

## 2016-01-17 MED ORDER — HEPARIN SODIUM (PORCINE) 5000 UNIT/ML IJ SOLN
5000.0000 [IU] | Freq: Three times a day (TID) | INTRAMUSCULAR | Status: DC
  Administered 2016-01-17 – 2016-01-20 (×8): 5000 [IU] via SUBCUTANEOUS
  Filled 2016-01-17 (×9): qty 1

## 2016-01-17 MED ORDER — METOPROLOL TARTRATE 100 MG PO TABS
100.0000 mg | ORAL_TABLET | Freq: Two times a day (BID) | ORAL | Status: DC
  Administered 2016-01-17 – 2016-01-20 (×6): 100 mg via ORAL
  Filled 2016-01-17 (×6): qty 1

## 2016-01-17 MED ORDER — PANTOPRAZOLE SODIUM 40 MG PO TBEC
40.0000 mg | DELAYED_RELEASE_TABLET | Freq: Two times a day (BID) | ORAL | Status: DC
  Administered 2016-01-17 – 2016-01-20 (×6): 40 mg via ORAL
  Filled 2016-01-17 (×6): qty 1

## 2016-01-17 NOTE — Progress Notes (Signed)
PROGRESS NOTE  [name] [id number] DOB: 1932-01-19 [name] PCP: [name], MD Outpatient Specialists:    LOS: 14 days   Brief Narrative: [name] is an 82 year old male who was admitted on [name] with chest pain and BRBPR x 3-4 days. History of diverticular bleeding with surgical intervention. Patient underwent full evaluation by gastroenterology, he has ongoing bleeding, Gen. surgery was consulted and patient will be taken to the operating room on 3/1. Cardiology was consulted for preop clearance given ongoing chest pains. Developed non-ST elevation MI postoperatively  Assessment & Plan: Principal Problem:   GI bleed Active Problems:   CKD (chronic kidney disease) stage 3, GFR 30-59 ml/min   Acute blood loss anemia   Acute on chronic diastolic (congestive) heart failure ([name])   Diabetes mellitus ([name])   Diastolic CHF ([name])   Atrial fibrillation ([name])   Chronic combined systolic and diastolic CHF (congestive heart failure) ([name])   Pulmonary hypertension ([name])   CKD (chronic kidney disease), stage III   History of GI diverticular bleed   Palliative care encounter   DNR (do not resuscitate) discussion   Rectal bleeding   CAD in native artery   Non-ST elevation myocardial infarction (NSTEMI) ([name])   Acute GI bleed/Acute blood loss anemia - Recurrent diverticular bleed despite cessation of Brilinta 5-6 weeks ago. - Colonoscopy 01/05/2016 revealed diverticulosis: severe in the left colon, moderate in the transverse colon and mild in the right colon. No evidence of active bleeding or old blood. - Nuclear RBC scan 01/10/2016 revealed increased activity in the left mid-abd, likely the distal transverse colon and proximal descending colon consistent with acute hemorrhage. - Cardiology cleared patient for surgery without further testing; patient is a high-risk surgical candidate - Suspected LGIB from diverticuli;open left hemicolectomy and sigmoidectomy  3/1 -  received a total of 6 units of packed red blood cells this admission NG tube discontinued, started on a heart healthy diet Hemoglobin change from 9.2> 7.7, > 9.3> 9.7 No recurrent bleeding Hospital course complicated by non-ST elevation MI postoperatively Pain management by surgery-on a Dilaudid PCA   Acute blood loss anemia Status post transfusion of 6 units of packed red blood cells this admission, Follow CBC closely, goal hemoglobin greater than 9.0 in the setting of non-ST elevation MI  CKD stage 3 - Appears euvolemic. Cr stable. Follow.   Chronic combined systolic and diastolic CHF/Acute on chronic diastolic (congestive) heart failure - Appears euvolemic - Cardiology following  Chest pain with coronary artery disease as well as NSTEMI. Now with suspected recurrent non-ST elevation MI ,CAD s/p CABG 1996 with redo 2006 by [name].  likely also has significant underlying coronary artery disease given persistent ST depression in the lateral leads, has anterior wall motion abnormality seen on recent echocardiogram despite having a stable EF of 50-55%. ECG post op yesterday with ST at 106 and slightly more pronounced ST depression. Developed recurrent anginal symptoms and found to have recurrent non-ST elevation MI postoperatively Managed by cardiology with  metoprolol, nitroglycerin, heparin, . Troponin peak 16 now decreasing c/w recurrent NSTEMI.  wean off nitroglycerin --Continue isosorbide 60 mg --Increase metoprolol to 100 mg twice a day --Stop IV heparin today - treated for non-ST elevation myocardial infarction.  --At this time, no cardiac catheterization. Continue with aggressive medical management. Chest pain-free   Diabetes mellitus - HgbA1c: 6.0 on arrival - Continue SSI  History of Atrial fibrillation  - No previous anticoagulation due to recurrent GIB - Current NSR  Essential hypertension - Blood pressure well controlled.  - Continue metoprolol   carotid  artery disease s/p R CEA by [name] '94  DVT prophylaxis: SCDs,  heparin Code Status: Full Family Communication: Patient's daughter is by the bedside  Disposition Plan: Critically ill, continue stepdown,    Barriers for discharge: LGIB  Consultants:   Gastroenterology  Cardiology  Surgery  Palliative   Procedures:   Colonoscopy 01/05/2016  Antimicrobials:  None   Subjective:  chest pain-free and hemodynamically stable this morning   Objective: Filed Vitals:   01/17/16 0721 01/17/16 0853 01/17/16 1133 01/17/16 1254  BP: 128/57  113/64   Pulse: 101  96   Temp: 99.6 F (37.6 C)  97.5 F (36.4 C)   TempSrc: Oral  Oral   Resp: 22 14 17 20   Height:      Weight:      SpO2: 96% 97% 93% 94%    Intake/Output Summary (Last 24 hours) at 01/17/16 1302 Last data filed at 01/17/16 1003  Gross per 24 hour  Intake  430.5 ml  Output    300 ml  Net  130.5 ml   Filed Weights   01/15/16 0030 01/16/16 0405 01/17/16 0315  Weight: 80 kg (176 lb 5.9 oz) 83.5 kg (184 lb 1.4 oz) 83.8 kg (184 lb 11.9 oz)   Examination: BP 113/64 mmHg  Pulse 96  Temp(Src) 97.5 F (36.4 C) (Oral)  Resp 20  Ht 5\' 9"  (1.753 m)  Wt 83.8 kg (184 lb 11.9 oz)  BMI 27.27 kg/m2  SpO2 94%   General: Pleasant, NAD. Currently pain free Neuro: Alert and oriented X 3. Moves all extremities spontaneously. Psych: Normal affect. HEENT: Normal Neck: Supple without bruits or JVD. Lungs: Resp regular and unlabored, CTA. Heart: RRR 1-2/6 sem no s3. Abdomen: Midline incision noted, covered with clear dressing, no obvious active bleeding, some dried blood. No bowel sound Extremities: No clubbing, cyanosis or edema. DP/PT/Radials 2+ and equal bilaterally.  Data Reviewed: I have personally reviewed following labs and imaging studies  CBC:  Recent Labs Lab 01/15/16 0520 01/15/16 0950 01/15/16 2332 01/16/16 0527 01/17/16 0221 01/17/16 0927  WBC 15.8*  --  13.9* 11.8* 10.1 10.3  HGB  7.8* 7.7* 9.3* 9.3* 9.3* 9.7*  HCT 24.4* 23.7* 29.0* 29.3* 29.3* 30.6*  MCV 91.7  --  91.5 92.1 91.3 91.6  PLT 194  --  191 206 230 [id number]   Basic Metabolic Panel:  Recent Labs Lab 01/13/16 0613 01/14/16 0435 01/15/16 0520 01/16/16 1040  NA 141 141 143 140  K 3.6 3.6 3.8 3.6  CL 102 108 109 105  CO2 28 24 24 24   GLUCOSE 161* 262* 152* 237*  BUN 28* 23* 24* 24*  CREATININE 1.83* 2.05* 1.94* 2.07*  CALCIUM 8.9 8.4* 8.7* 8.4*  MG 2.1 1.9  --   --    GFR: Estimated Creatinine Clearance: 28 mL/min (by C-G formula based on Cr of 2.07). Liver Function Tests:  Recent Labs Lab 01/15/16 0520 01/16/16 1040  AST 63* 54*  ALT 23 24  ALKPHOS 63 71  BILITOT 0.7 1.3*  PROT 5.7* 6.1*  ALBUMIN 2.7* 2.6*   No results for input(s): LIPASE, AMYLASE in the last 168 hours. No results for input(s): AMMONIA in the last 168 hours. Coagulation Profile:  Recent Labs Lab 01/13/16 0613  INR 1.18   Cardiac Enzymes:  Recent Labs Lab 01/15/16 0520 01/15/16 0950 01/15/16 2332 01/16/16 0527 01/16/16 1040  TROPONINI 8.73* 12.06* 16.00* 13.67* 11.25*  BNP (last 3 results) No results for input(s): PROBNP in the last 8760 hours. HbA1C: No results for input(s): HGBA1C in the last 72 hours. CBG:  Recent Labs Lab 01/16/16 0721 01/16/16 1209 01/16/16 1558 01/16/16 2131 01/17/16 0829  GLUCAP 150* 226* 189* 151* 169*   Lipid Profile: No results for input(s): CHOL, HDL, LDLCALC, TRIG, CHOLHDL, LDLDIRECT in the last 72 hours. Thyroid Function Tests: No results for input(s): TSH, T4TOTAL, FREET4, T3FREE, THYROIDAB in the last 72 hours. Anemia Panel: No results for input(s): VITAMINB12, FOLATE, FERRITIN, TIBC, IRON, RETICCTPCT in the last 72 hours. Urine analysis:    Component Value Date/Time   COLORURINE YELLOW 11/14/2015 [place] 11/14/2015 0241   LABSPEC 1.018 11/14/2015 0241   PHURINE 5.0 11/14/2015 0241   GLUCOSEU NEGATIVE 11/14/2015 0241   HGBUR NEGATIVE  11/14/2015 0241   HGBUR negative 09/12/2007 1028   BILIRUBINUR NEGATIVE 11/14/2015 0241   KETONESUR NEGATIVE 11/14/2015 0241   PROTEINUR NEGATIVE 11/14/2015 0241   UROBILINOGEN 0.2 06/14/2014 2210   NITRITE NEGATIVE 11/14/2015 0241   LEUKOCYTESUR NEGATIVE 11/14/2015 0241   Sepsis Labs: Invalid input(s): PROCALCITONIN, LACTICIDVEN  Recent Results (from the past 240 hour(s))  Surgical pcr screen     Status: None   Collection Time: 01/13/16  6:19 AM  Result Value Ref Range Status   MRSA, PCR NEGATIVE NEGATIVE Final   Staphylococcus aureus NEGATIVE NEGATIVE Final    Comment:        The Xpert SA Assay (FDA approved for NASAL specimens in patients over 65 years of age), is one component of a comprehensive surveillance program.  Test performance has been validated by [facility] for patients greater than or equal to 64 year old. It is not intended to diagnose infection nor to guide or monitor treatment.       Radiology Studies: No results found.   Scheduled Meds: . sodium chloride   Intravenous Once  . alvimopan  12 mg Oral BID  . aspirin EC  81 mg Oral Daily  . heparin  5,000 Units Subcutaneous 3 times per day  . HYDROmorphone   Intravenous 6 times per day  . insulin aspart  0-15 Units Subcutaneous TID WC  . insulin aspart  0-5 Units Subcutaneous QHS  . isosorbide mononitrate  60 mg Oral BID WC  . metoprolol tartrate  100 mg Oral BID  . pantoprazole (PROTONIX) IV  40 mg Intravenous Q12H   Continuous Infusions:  None.   Reyne Dumas , MD,  [facility] Pager [phone]  If 7PM-7AM, please contact night-coverage [email or web address] Password [email or web address] 01/17/2016, 1:02 PM

## 2016-01-17 NOTE — Progress Notes (Signed)
Patient Name: [name] Date of Encounter: 01/17/2016  Primary Cardiologist: Dr. Gwenlyn Found (formerly Dr. [name])   Principal Problem:   GI bleed Active Problems:   CKD (chronic kidney disease) stage 3, GFR 30-59 ml/min   Acute blood loss anemia   Acute on chronic diastolic (congestive) heart failure ([name])   Diabetes mellitus ([name])   Diastolic CHF ([name])   Atrial fibrillation ([name])   Chronic combined systolic and diastolic CHF (congestive heart failure) ([name])   Pulmonary hypertension ([name])   CKD (chronic kidney disease), stage III   History of GI diverticular bleed   Palliative care encounter   DNR (do not resuscitate) discussion   Rectal bleeding   CAD in native artery   Non-ST elevation myocardial infarction (NSTEMI) ([place])    SUBJECTIVE  No further anginal symptoms. Heart rate improved but would like even better control. No bleeding. Abd discomfort noted.   Scheduled Meds: . sodium chloride   Intravenous Once  . alvimopan  12 mg Oral BID  . aspirin EC  81 mg Oral Daily  . HYDROmorphone   Intravenous 6 times per day  . insulin aspart  0-15 Units Subcutaneous TID WC  . insulin aspart  0-5 Units Subcutaneous QHS  . isosorbide mononitrate  60 mg Oral BID WC  . metoprolol tartrate  50 mg Oral BID  . pantoprazole (PROTONIX) IV  40 mg Intravenous Q12H   Continuous Infusions: . heparin 950 Units/hr (01/16/16 2000)   PRN Meds:.diphenhydrAMINE **OR** diphenhydrAMINE, naloxone **AND** sodium chloride flush, ondansetron **OR** ondansetron (ZOFRAN) IV  OBJECTIVE  Filed Vitals:   01/16/16 2315 01/17/16 0315 01/17/16 0721 01/17/16 0853  BP: 95/61 114/52 128/57   Pulse: 86 100 101   Temp: 98.1 F (36.7 C) 98.3 F (36.8 C) 99.6 F (37.6 C)   TempSrc: Oral Oral Oral   Resp: '19 19 22 14  '$ Height:      Weight:  184 lb 11.9 oz (83.8 kg)    SpO2: 99% 97% 96% 97%    Intake/Output Summary (Last 24 hours) at 01/17/16 1101 Last data filed at 01/17/16 1003  Gross per 24 hour    Intake  500.9 ml  Output    300 ml  Net  200.9 ml   Filed Weights   01/15/16 0030 01/16/16 0405 01/17/16 0315  Weight: 176 lb 5.9 oz (80 kg) 184 lb 1.4 oz (83.5 kg) 184 lb 11.9 oz (83.8 kg)    PHYSICAL EXAM  General: Pleasant, NAD. Currently pain free Neuro: Alert and oriented X 3. Moves all extremities spontaneously. Psych: Normal affect. HEENT:  Normal  Neck: Supple without bruits or JVD. Lungs:  Resp regular and unlabored, CTA. Heart: RRR  1-2/6 sem no s3. Abdomen: Midline incision noted. No bowel sound Extremities: No clubbing, cyanosis or edema. DP/PT/Radials 2+ and equal bilaterally.  Accessory Clinical Findings  CBC  Recent Labs  01/17/16 0221 01/17/16 0927  WBC 10.1 10.3  HGB 9.3* 9.7*  HCT 29.3* 30.6*  MCV 91.3 91.6  PLT 230 235   BMP Latest Ref Rng 01/16/2016 01/15/2016 01/14/2016  Glucose 65 - 99 mg/dL 237(H) 152(H) 262(H)  BUN 6 - 20 mg/dL 24(H) 24(H) 23(H)  Creatinine 0.61 - 1.24 mg/dL 2.07(H) 1.94(H) 2.05(H)  Sodium 135 - 145 mmol/L 140 143 141  Potassium 3.5 - 5.1 mmol/L 3.6 3.8 3.6  Chloride 101 - 111 mmol/L 105 109 108  CO2 22 - 32 mmol/L '24 24 24  '$ Calcium 8.9 - 10.3 mg/dL 8.4(L) 8.7(L) 8.4(L)  Hepatic Function Latest Ref Rng 01/16/2016 01/15/2016 01/09/2016  Total Protein 6.5 - 8.1 g/dL 6.1(L) 5.7(L) 6.2(L)  Albumin 3.5 - 5.0 g/dL 2.6(L) 2.7(L) 3.2(L)  AST 15 - 41 U/L 54(H) 63(H) 23  ALT 17 - 63 U/L '24 23 20  '$ Alk Phosphatase 38 - 126 U/L 71 63 60  Total Bilirubin 0.3 - 1.2 mg/dL 1.3(H) 0.7 0.7   CBC Latest Ref Rng 01/17/2016 01/17/2016 01/16/2016  WBC 4.0 - 10.5 K/uL 10.3 10.1 11.8(H)  Hemoglobin 13.0 - 17.0 g/dL 9.7(L) 9.3(L) 9.3(L)  Hematocrit 39.0 - 52.0 % 30.6(L) 29.3(L) 29.3(L)  Platelets 150 - 400 K/uL 235 230 206   Lab Results  Component Value Date   MCV 91.6 01/17/2016   MCV 91.3 01/17/2016   MCV 92.1 01/16/2016   Troponin (Point of Care Test) No results for input(s): TROPIPOC in the last 72 hours.    TELE Atrial fibrillation heart  rate of 90-120     3/2 post op ECG (independently read by me): ST at 106; RBBB slight ly more pronounced ST depression    Echocardiogram 11/17/2015  LV EF: 50% -  55%  ------------------------------------------------------------------- Indications:   Fever 780.6.  ------------------------------------------------------------------- History:  PMH: NSTEMI. Chest pain. Dyspnea. Coronary artery disease. Risk factors: Diabetes mellitus.  ------------------------------------------------------------------- Study Conclusions  - Left ventricle: There is hypokinesis of the mid anterolateral , anterior and distal lateral and anterior walls. The cavity size was normal. Systolic function was normal. The estimated ejection fraction was in the range of 50% to 55%. Wall motion was normal; there were no regional wall motion abnormalities. Features are consistent with a pseudonormal left ventricular filling pattern, with concomitant abnormal relaxation and increased filling pressure (grade 2 diastolic dysfunction). Doppler parameters are consistent with elevated ventricular end-diastolic filling pressure. - Aortic root: The aortic root was normal in size. - Mitral valve: There was mild to moderate regurgitation. - Left atrium: The atrium was moderately dilated. - Right ventricle: Systolic function was normal. - Tricuspid valve: Structurally normal valve. There was moderate regurgitation. - Pulmonic valve: There was no regurgitation. - Pulmonary arteries: Systolic pressure was moderately increased. PA peak pressure: 51 mm Hg (S). - Inferior vena cava: The vessel was normal in size. - Pericardium, extracardiac: There was no pericardial effusion.    Radiology/Studies  Dg Chest 2 View  01/03/2016  CLINICAL DATA:  82 year old male with chest pain EXAM: CHEST  2 VIEW COMPARISON:  Radiograph dated 12/18/2015 FINDINGS: Two views of the chest demonstrate stable  minimal left lung base linear atelectasis. There is no focal consolidation, pleural effusion, or pneumothorax. Stable cardiac silhouette. Median sternotomy wires and CABG vascular clips. No acute osseous pathology. IMPRESSION: No active cardiopulmonary disease. Electronically Signed   By: Anner Crete M.D.   On: 01/03/2016 03:02   Dg Chest 2 View  12/18/2015  CLINICAL DATA:  Pneumonia. EXAM: CHEST  2 VIEW COMPARISON:  November 17, 2015. FINDINGS: Stable cardiomediastinal silhouette. Status post coronary artery bypass graft. No pneumothorax or effusion is noted. Multilevel degenerative disc disease is noted in mid thoracic spine. Atherosclerosis thoracic aorta is noted. Right lung is clear. Mild left basilar subsegmental atelectasis is noted. IMPRESSION: Mild left basilar subsegmental atelectasis. Electronically Signed   By: Marijo Conception, M.D.   On: 12/18/2015 13:12   Nm Gi Blood Loss  01/10/2016  CLINICAL DATA:  GI hemorrhage and rectal bleeding EXAM: NUCLEAR MEDICINE GASTROINTESTINAL BLEEDING SCAN TECHNIQUE: Sequential abdominal images were obtained following intravenous administration of Tc-18mlabeled red blood cells.  RADIOPHARMACEUTICALS:  25.1 mCi Tc-69min-vitro labeled red cells. COMPARISON:  01/03/2016 FINDINGS: There are now areas of increased activity identified in the left mid abdomen which demonstrate mobility and likely lie within the distal transverse colon and proximal descending colon consistent with acute hemorrhage. No other focal area of hemorrhage is seen. IMPRESSION: Areas of increased activity in the far lateral left abdomen likely related to the distal transverse and proximal descending colon. Electronically Signed   By: MInez CatalinaM.D.   On: 01/10/2016 16:30   Nm Gi Blood Loss  01/03/2016  CLINICAL DATA:  History of GI diverticular bleed. GI bleeding. Dark blood in the stools noted 4 days ago. EXAM: NUCLEAR MEDICINE GASTROINTESTINAL BLEEDING SCAN TECHNIQUE: Sequential abdominal  images were obtained following intravenous administration of Tc-911mabeled red blood cells. RADIOPHARMACEUTICALS:  25.0 mCi Tc-9962m-vitro labeled red cells. COMPARISON:  09/13/2008 FINDINGS: Early level activity is identified. Delayed images are performed, demonstrating no site of active bleeding. IMPRESSION: A site of active bleeding is not identified. Electronically Signed   By: EliNolon NationsD.   On: 01/03/2016 16:27    ASSESSMENT AND PLAN  Mr. RidHudnall a pleasant 83 35ar old Caucasian male with past medical history of severe diverticulosis, multiple GI bleed since 1981, carotid artery disease s/p R CEA by Dr. EarDonnetta Hutching4, HTN, HLD, DM, CKD stage IV, CAD s/p CABG 1996 with redo 2006 by Dr. GerServando Snarend h/o PAF not on coumadin due to recurrent GI bleed presented with recurrent GI bleed after recent NSTEMI on medical management.    Recurrent GI bleeding and severe pancolonic diverticulosis;  s/p  left and sigmoid colon removal with extended open left hemicolectomy and sigmoidectomy 01/13/16 by  Dr. ArmRalene Okurrently states that he has the urge to have a bowel movement. No further bleeding.  Non-ST elevation myocardial infarction-CAD s/p CABG 1996 with redo 2006 by Dr. GerServando SnarePatient had recent h/o exertional angina, although current anemia can contribute to angina, he likely also has significant underlying coronary artery disease given persistent ST depression in the lateral leads on earlier ECGs. . HMarland Kitchen also has anterior wall motion abnormality seen on recent echocardiogram despite having a stable EF of 50-55%.     ECG post op demonstrated ST at 106 and slightly more pronounced ST depression.  Developed recurrent anginal which have subsequently stopped. Troponin peak 16 now decreasing  c/w recurrent NSTEMI. Patient pain free.  --We will wean off nitroglycerin --Continue isosorbide 60 mg --Increase metoprolol to 100 mg twice a day --Stop IV heparin today - treated for  non-ST elevation myocardial infarction. Will change to SQ HEP  --At this time, no cardiac catheterization. Continue with aggressive medical management. Chest pain-free.   Anemia: Stable hemoglobin 9.7  CKD stage IV-creatinine 2.07  Carotid artery disease s/p R CEA by Dr. EarDonnetta Hutching4  HTN HLD DM Paroxysmal atrial fibrillation - Heart rate 100-110.  - not on coumadin due to recurrent GI bleed -Increased metoprolol for better rate control.  -Will try to increase again metoprolol '100mg'$  PO BID (may not be able to tolerate higher dose secondary to hypotension).    SKACandee FurbishD  01/17/2016 11:01 AM

## 2016-01-17 NOTE — Progress Notes (Signed)
ANTICOAGULATION CONSULT NOTE - Initial Consult  Pharmacy Consult for Heparin  Indication: chest pain/ACS  No Known Allergies  Patient Measurements: Height: 5\' 9"  (175.3 cm) Weight: 184 lb 11.9 oz (83.8 kg) IBW/kg (Calculated) : 70.7  Vital Signs: Temp: 99.6 F (37.6 C) (03/05 0721) Temp Source: Oral (03/05 0721) BP: 128/57 mmHg (03/05 0721) Pulse Rate: 101 (03/05 0721)  Labs:  Recent Labs  01/15/16 0520  01/15/16 2332 01/16/16 0527 01/16/16 1040 01/16/16 1600 01/17/16 0221 01/17/16 0927  HGB 7.8*  < > 9.3* 9.3*  --   --  9.3* 9.7*  HCT 24.4*  < > 29.0* 29.3*  --   --  29.3* 30.6*  PLT 194  --  191 206  --   --  230 235  HEPARINUNFRC  --   < >  --  0.25*  --  0.34 0.43  --   CREATININE 1.94*  --   --   --  2.07*  --   --   --   TROPONINI 8.73*  < > 16.00* 13.67* 11.25*  --   --   --   < > = values in this interval not displayed.  Estimated Creatinine Clearance: 28 mL/min (by C-G formula based on Cr of 2.07).  Assessment: Patient is POD #4 from hemicolectomy/sigmoidectomy after having recurrent lower GI bleeds, pt is now having significant cardiac issues, cardiology has discussed with surgery and they are ok with heparin at this time. HLs are therapeutic x 2 (0.34, 0.43) on 950 units/hr. Hgb is 9.7 - stable. No issues per RN.   Goal of Therapy:  Heparin level 0.3-0.5 units/ml, with hx GIB Monitor platelets by anticoagulation protocol: Yes   Plan:  - Continue heparin infusion at 950 units/hr - Daily HL, CBC - Monitor for bleeding very closely, trend Hgb  Eesa Justiss L. Nicole Kindred, PharmD PGY2 Infectious Diseases Pharmacy Resident Pager: [phone] 01/17/2016 10:37 AM

## 2016-01-17 NOTE — Progress Notes (Signed)
Progress Note: General Surgery Service   Subjective: Pain controlled with PCA. +BM overnight, tolerating liquids  Objective: Vital signs in last 24 hours: Temp:  [98.1 F (36.7 C)-99.6 F (37.6 C)] 99.6 F (37.6 C) (03/05 0721) Pulse Rate:  [79-124] 101 (03/05 0721) Resp:  [13-22] 22 (03/05 0721) BP: (86-130)/(52-71) 128/57 mmHg (03/05 0721) SpO2:  [84 %-100 %] 96 % (03/05 0721) Weight:  [83.8 kg (184 lb 11.9 oz)] 83.8 kg (184 lb 11.9 oz) (03/05 0315) Last BM Date: 01/13/16  Intake/Output from previous day: 03/04 0701 - 03/05 0700 In: 260.9 [I.V.:260.9] Out: 475 [Urine:475] Intake/Output this shift:    Lungs: CTAB  Cardiovascular: tachycardic  Abd: soft, NT, ND, wound c/d/i  Extremities: no edema  Neuro: AOx4  Lab Results: CBC   Recent Labs  01/16/16 0527 01/17/16 0221  WBC 11.8* 10.1  HGB 9.3* 9.3*  HCT 29.3* 29.3*  PLT 206 230   BMET  Recent Labs  01/15/16 0520 01/16/16 1040  NA 143 140  K 3.8 3.6  CL 109 105  CO2 24 24  GLUCOSE 152* 237*  BUN 24* 24*  CREATININE 1.94* 2.07*  CALCIUM 8.7* 8.4*   PT/INR No results for input(s): LABPROT, INR in the last 72 hours. ABG No results for input(s): PHART, HCO3 in the last 72 hours.  Invalid input(s): PCO2, PO2  Studies/Results:  Anti-infectives: Anti-infectives    Start     Dose/Rate Route Frequency Ordered Stop   01/14/16 0800  cefoTEtan (CEFOTAN) 2 g in dextrose 5 % 50 mL IVPB     2 g 100 mL/hr over 30 Minutes Intravenous  Once 01/13/16 1755 01/14/16 0930   01/13/16 1415  cefoTEtan (CEFOTAN) 2 g in dextrose 5 % 50 mL IVPB  Status:  Discontinued     2 g 100 mL/hr over 30 Minutes Intravenous Every 12 hours 01/13/16 1412 01/13/16 1718   01/13/16 1012  cefoTEtan in Dextrose 5% (CEFOTAN) 2-2.08 GM-% IVPB    Comments:  Leandrew Koyanagi   : cabinet override      01/13/16 1012 01/13/16 2229   01/13/16 0600  cefoTEtan (CEFOTAN) 2 g in dextrose 5 % 50 mL IVPB     2 g 100 mL/hr over 30 Minutes  Intravenous On call to O.R. 01/12/16 1307 01/13/16 1112   01/12/16 1400  neomycin (MYCIFRADIN) tablet 1,000 mg     1,000 mg Oral 3 times per day 01/12/16 1307 01/12/16 2332   01/12/16 1400  erythromycin (E-MYCIN) tablet 1,000 mg     1,000 mg Oral 3 times per day 01/12/16 1307 01/12/16 2332      Medications: Scheduled Meds: . sodium chloride   Intravenous Once  . alvimopan  12 mg Oral BID  . aspirin EC  81 mg Oral Daily  . HYDROmorphone   Intravenous 6 times per day  . insulin aspart  0-15 Units Subcutaneous TID WC  . insulin aspart  0-5 Units Subcutaneous QHS  . isosorbide mononitrate  60 mg Oral BID WC  . metoprolol tartrate  50 mg Oral BID  . pantoprazole (PROTONIX) IV  40 mg Intravenous Q12H   Continuous Infusions: . heparin 950 Units/hr (01/16/16 2000)   PRN Meds:.diphenhydrAMINE **OR** diphenhydrAMINE, naloxone **AND** sodium chloride flush, ondansetron **OR** ondansetron (ZOFRAN) IV  Assessment/Plan: Patient Active Problem List   Diagnosis Date Noted  . Non-ST elevation myocardial infarction (NSTEMI) ([place])   . Rectal bleeding   . CAD in native artery   . Palliative care encounter   . DNR (  do not resuscitate) discussion   . History of GI diverticular bleed   . Diabetes mellitus ([place]) 01/03/2016  . Diastolic CHF ([place]) [id number]  . Atrial fibrillation ([place]) 01/03/2016  . Angina at rest [facility])   . Chronic combined systolic and diastolic CHF (congestive heart failure) ([place])   . Pulmonary hypertension ([place])   . CKD (chronic kidney disease), stage III   . Pneumonia 12/01/2015  . Acute on chronic diastolic (congestive) heart failure ([place]) 11/20/2015  . Hypokalemia 11/18/2015  . HCAP (healthcare-associated pneumonia) 11/17/2015  . NSTEMI (non-ST elevated myocardial infarction) ([place])   . Acute GI bleeding   . Diverticulosis of colon with hemorrhage   . GI bleed 11/13/2015  . Symptomatic anemia 11/13/2015  . Acute-on-chronic kidney injury ([place])   . Acute blood loss  anemia   . Tremor 09/01/2015  . Gout 09/09/2014  . Medicare annual wellness visit, initial 09/09/2014  . Advance care planning 09/09/2014  . Ejection fraction   . Heme positive stool 06/19/2014  . Long-term (current) use of anticoagulants   . RBBB (right bundle branch block)   . Carotid artery disease ([place])   . CAD (coronary artery disease)   . Hx of CABG   . History of gastrointestinal bleeding   . Mitral regurgitation   . ED (erectile dysfunction) 03/01/2012  . CKD (chronic kidney disease) stage 3, GFR 30-59 ml/min 03/24/2011  . Atrial flutter ([place]) 08/24/2010  . Paroxysmal atrial fibrillation ([place]) 08/09/2010  . Peripheral sensory neuropathy due to type 2 diabetes mellitus ([place])   . GERD   . Sleep apnea   . Type 2 diabetes mellitus with peripheral neuropathy ([name])   . Hyperlipidemia   . Hypertensive heart disease    s/p Procedure(s): EXPLORATORY LAPAROTOMY, LEFT AND SIGMOID COLON REMOVAL 01/13/2016 +return of bowel function -advance diet -continue PCA one more day -continue ambulate -f/u cards recs   LOS: 14 days   Mickeal Skinner, MD Pg# [phone] [facility]

## 2016-01-17 NOTE — NC FL2 (Signed)
[place] LEVEL OF CARE SCREENING TOOL     IDENTIFICATION  Patient Name: [name] Birthdate: [birth year] Sex: male Admission Date (Current Location): 01/03/2016  [facility] and [place] Number:  Herbalist and Address:  [facility] [place]. [facility], [place] [address] [place], [place] [address]      Provider Number: [id number]  Attending Physician Name and Address:  Reyne Dumas, MD  Relative Name and Phone Number:       Current Level of Care: Hospital Recommended Level of Care: [place] Prior Approval Number:    Date Approved/Denied:   PASRR Number:  [id number]  Discharge Plan: SNF    Current Diagnoses: Patient Active Problem List   Diagnosis Date Noted  . Non-ST elevation myocardial infarction (NSTEMI) ([place])   . Rectal bleeding   . CAD in native artery   . Palliative care encounter   . DNR (do not resuscitate) discussion   . History of GI diverticular bleed   . Diabetes mellitus ([place]) 01/03/2016  . Diastolic CHF ([place]) Q000111Q  . Atrial fibrillation ([place]) 01/03/2016  . Angina at rest [facility])   . Chronic combined systolic and diastolic CHF (congestive heart failure) ([place])   . Pulmonary hypertension ([place])   . CKD (chronic kidney disease), stage III   . Pneumonia 12/01/2015  . Acute on chronic diastolic (congestive) heart failure ([place]) 11/20/2015  . Hypokalemia 11/18/2015  . HCAP (healthcare-associated pneumonia) 11/17/2015  . NSTEMI (non-ST elevated myocardial infarction) ([place])   . Acute GI bleeding   . Diverticulosis of colon with hemorrhage   . GI bleed 11/13/2015  . Symptomatic anemia 11/13/2015  . Acute-on-chronic kidney injury ([place])   . Acute blood loss anemia   . Tremor 09/01/2015  . Gout 09/09/2014  . Medicare annual wellness visit, initial 09/09/2014  . Advance care planning 09/09/2014  . Ejection fraction   . Heme positive stool 06/19/2014  . Long-term (current) use of anticoagulants   . RBBB (right  bundle branch block)   . Carotid artery disease ([place])   . CAD (coronary artery disease)   . Hx of CABG   . History of gastrointestinal bleeding   . Mitral regurgitation   . ED (erectile dysfunction) 03/01/2012  . CKD (chronic kidney disease) stage 3, GFR 30-59 ml/min 03/24/2011  . Atrial flutter ([place]) 08/24/2010  . Paroxysmal atrial fibrillation ([place]) 08/09/2010  . Peripheral sensory neuropathy due to type 2 diabetes mellitus ([place])   . GERD   . Sleep apnea   . Type 2 diabetes mellitus with peripheral neuropathy ([name])   . Hyperlipidemia   . Hypertensive heart disease     Orientation RESPIRATION BLADDER Height & Weight     Self, Time, Situation, Place  Normal Continent Weight: 184 lb 11.9 oz (83.8 kg) Height:  5\' 9"  (175.3 cm)  BEHAVIORAL SYMPTOMS/MOOD NEUROLOGICAL BOWEL NUTRITION STATUS      Continent    AMBULATORY STATUS COMMUNICATION OF NEEDS Skin   Extensive Assist Verbally Normal                       Personal Care Assistance Level of Assistance  Bathing, Feeding, Dressing Bathing Assistance: Maximum assistance Feeding assistance: Independent Dressing Assistance: Maximum assistance     Functional Limitations Info  Sight, Hearing, Speech Sight Info: Adequate Hearing Info: Impaired Speech Info: Adequate    SPECIAL CARE FACTORS FREQUENCY  PT (By licensed PT), OT (By licensed OT)  Contractures      Additional Factors Info  Code Status, Allergies Code Status Info: Full code Allergies Info: No known allergies            Current Medications (01/17/2016):  This is the current hospital active medication list Current Facility-Administered Medications  Medication Dose Route Frequency Provider Last Rate Last Dose  . 0.9 %  sodium chloride infusion   Intravenous Once Reyne Dumas, MD      . alvimopan (ENTEREG) capsule 12 mg  12 mg Oral BID Earnstine Regal, PA-C   12 mg at 01/17/16 0851  . aspirin EC tablet 81 mg  81 mg Oral Daily  Earnstine Regal, PA-C   81 mg at 01/17/16 0850  . diphenhydrAMINE (BENADRYL) injection 12.5 mg  12.5 mg Intravenous Q6H PRN Earnstine Regal, PA-C       Or  . diphenhydrAMINE (BENADRYL) 12.5 MG/5ML elixir 12.5 mg  12.5 mg Oral Q6H PRN Earnstine Regal, PA-C      . heparin injection 5,000 Units  5,000 Units Subcutaneous 3 times per day Jerline Pain, MD   5,000 Units at 01/17/16 1249  . HYDROmorphone (DILAUDID) 1 mg/mL PCA injection   Intravenous 6 times per day Mickeal Skinner, MD   2 mg at 01/17/16 1254  . insulin aspart (novoLOG) injection 0-15 Units  0-15 Units Subcutaneous TID WC [place] Karlyne Greenspan, MD   3 Units at 01/17/16 1249  . insulin aspart (novoLOG) injection 0-5 Units  0-5 Units Subcutaneous QHS Cherene Altes, MD   3 Units at 01/13/16 2204  . isosorbide mononitrate (IMDUR) 24 hr tablet 60 mg  60 mg Oral BID WC Earnstine Regal, PA-C   60 mg at 01/17/16 0850  . metoprolol (LOPRESSOR) tablet 100 mg  100 mg Oral BID Jerline Pain, MD      . naloxone [facility]) injection 0.4 mg  0.4 mg Intravenous PRN Earnstine Regal, PA-C       And  . sodium chloride flush (NS) 0.9 % injection 9 mL  9 mL Intravenous PRN Earnstine Regal, PA-C      . ondansetron [facility]) tablet 4 mg  4 mg Oral Q6H PRN Theressa Millard, MD       Or  . ondansetron (ZOFRAN) injection 4 mg  4 mg Intravenous Q6H PRN Theressa Millard, MD   4 mg at 01/14/16 1223  . pantoprazole (PROTONIX) EC tablet 40 mg  40 mg Oral BID Reyne Dumas, MD         Discharge Medications: Please see discharge summary for a list of discharge medications.  Relevant Imaging Results:  Relevant Lab Results:   Additional Information SS#: [id number]  Raymondo Band, LCSW

## 2016-01-18 ENCOUNTER — Inpatient Hospital Stay (HOSPITAL_COMMUNITY): Payer: Medicare Other

## 2016-01-18 DIAGNOSIS — E1142 Type 2 diabetes mellitus with diabetic polyneuropathy: Secondary | ICD-10-CM

## 2016-01-18 DIAGNOSIS — M25462 Effusion, left knee: Secondary | ICD-10-CM | POA: Diagnosis present

## 2016-01-18 DIAGNOSIS — K922 Gastrointestinal hemorrhage, unspecified: Secondary | ICD-10-CM | POA: Insufficient documentation

## 2016-01-18 DIAGNOSIS — I481 Persistent atrial fibrillation: Secondary | ICD-10-CM

## 2016-01-18 DIAGNOSIS — L8990 Pressure ulcer of unspecified site, unspecified stage: Secondary | ICD-10-CM | POA: Diagnosis not present

## 2016-01-18 DIAGNOSIS — R531 Weakness: Secondary | ICD-10-CM

## 2016-01-18 DIAGNOSIS — I5189 Other ill-defined heart diseases: Secondary | ICD-10-CM | POA: Diagnosis present

## 2016-01-18 DIAGNOSIS — N189 Chronic kidney disease, unspecified: Secondary | ICD-10-CM

## 2016-01-18 DIAGNOSIS — N179 Acute kidney failure, unspecified: Secondary | ICD-10-CM

## 2016-01-18 DIAGNOSIS — Z951 Presence of aortocoronary bypass graft: Secondary | ICD-10-CM

## 2016-01-18 DIAGNOSIS — I519 Heart disease, unspecified: Secondary | ICD-10-CM

## 2016-01-18 LAB — GLUCOSE, CAPILLARY
GLUCOSE-CAPILLARY: 121 mg/dL — AB (ref 65–99)
GLUCOSE-CAPILLARY: 208 mg/dL — AB (ref 65–99)
Glucose-Capillary: 212 mg/dL — ABNORMAL HIGH (ref 65–99)
Glucose-Capillary: 171 mg/dL — ABNORMAL HIGH (ref 65–99)
Glucose-Capillary: 166 mg/dL — ABNORMAL HIGH (ref 65–99)

## 2016-01-18 LAB — BASIC METABOLIC PANEL
ANION GAP: 10 (ref 5–15)
BUN: 18 mg/dL (ref 6–20)
CALCIUM: 8.5 mg/dL — AB (ref 8.9–10.3)
CO2: 25 mmol/L (ref 22–32)
Chloride: 103 mmol/L (ref 101–111)
Creatinine, Ser: 1.55 mg/dL — ABNORMAL HIGH (ref 0.61–1.24)
GFR calc Af Amer: 47 mL/min — ABNORMAL LOW (ref >60)
GFR, EST NON AFRICAN AMERICAN: 40 mL/min — AB (ref >60)
GLUCOSE: 230 mg/dL — AB (ref 65–99)
Potassium: 3.5 mmol/L (ref 3.5–5.1)
Sodium: 138 mmol/L (ref 135–145)

## 2016-01-18 LAB — CBC
HCT: 28.4 % — ABNORMAL LOW (ref 39.0–52.0)
Hemoglobin: 9.1 g/dL — ABNORMAL LOW (ref 13.0–17.0)
MCH: 29.3 pg (ref 26.0–34.0)
MCHC: 32 g/dL (ref 30.0–36.0)
MCV: 91.3 fL (ref 78.0–100.0)
PLATELETS: 214 10*3/uL (ref 150–400)
RBC: 3.11 MIL/uL — ABNORMAL LOW (ref 4.22–5.81)
RDW: 15 % (ref 11.5–15.5)
WBC: 9.3 10*3/uL (ref 4.0–10.5)

## 2016-01-18 MED ORDER — DOCUSATE SODIUM 100 MG PO CAPS
100.0000 mg | ORAL_CAPSULE | Freq: Two times a day (BID) | ORAL | Status: DC
  Administered 2016-01-18 – 2016-01-20 (×5): 100 mg via ORAL
  Filled 2016-01-18 (×5): qty 1

## 2016-01-18 MED ORDER — POLYETHYLENE GLYCOL 3350 17 G PO PACK
17.0000 g | PACK | Freq: Every day | ORAL | Status: DC
  Administered 2016-01-18 – 2016-01-20 (×3): 17 g via ORAL
  Filled 2016-01-18 (×3): qty 1

## 2016-01-18 MED ORDER — COLCHICINE 0.6 MG PO TABS: 0.6000 mg | ORAL_TABLET | Freq: Every day | ORAL | Status: DC

## 2016-01-18 MED ORDER — OXYCODONE-ACETAMINOPHEN 5-325 MG PO TABS
1.0000 | ORAL_TABLET | ORAL | Status: DC | PRN
Start: 2016-01-18 — End: 2016-01-20
  Administered 2016-01-18: 1 via ORAL
  Administered 2016-01-18: 2 via ORAL
  Administered 2016-01-19 – 2016-01-20 (×5): 1 via ORAL
  Filled 2016-01-18: qty 2
  Filled 2016-01-18: qty 1
  Filled 2016-01-18: qty 2
  Filled 2016-01-18: qty 1
  Filled 2016-01-18: qty 2
  Filled 2016-01-18: qty 1

## 2016-01-18 MED ORDER — COLCHICINE 0.6 MG PO TABS
0.6000 mg | ORAL_TABLET | Freq: Two times a day (BID) | ORAL | Status: DC
  Administered 2016-01-18 – 2016-01-20 (×5): 0.6 mg via ORAL
  Filled 2016-01-18 (×5): qty 1

## 2016-01-18 MED ORDER — HYDROMORPHONE HCL 1 MG/ML IJ SOLN
0.5000 mg | INTRAMUSCULAR | Status: DC | PRN
Start: 2016-01-18 — End: 2016-01-20

## 2016-01-18 NOTE — Progress Notes (Signed)
Patient ID: [name], male   DOB: 05-30-1932, 82 y.o.   MRN: [id number]     [place]      [address] [place], [place] [address]    Phone: [phone] FAX: [phone]     Subjective: Afebrile.  VSS.  H&h are stable. Walked x1,refused earlier.  Complaining of left knee pain. Cant bear weight.   Objective:  Vital signs:  Filed Vitals:   01/18/16 0500 01/18/16 0540 01/18/16 0542 01/18/16 0756  BP:  117/69    Pulse:  88    Temp:   97.9 F (36.6 C)   TempSrc:   Oral   Resp:  17  18  Height:      Weight: 83.7 kg (184 lb 8.4 oz)     SpO2:  96%  100%    Last BM Date: 01/17/16  Intake/Output   Yesterday:  03/05 0701 - 03/06 0700 In: 340 [P.O.:240; I.V.:100] Out: 402 [Urine:401; Stool:1] This shift:    I/O last 3 completed shifts: In: 530.5 [P.O.:240; I.V.:290.5] Out: 602 [Urine:601; Stool:1]    Physical Exam: General: Pt awake/alert/oriented x4 in no acute distress  Abdomen: Soft.  Nondistended.  Mildly tender at incisions only.  Dressing removed.  Incision without erythema.  No evidence of peritonitis.  No incarcerated hernias.    Problem List:   Principal Problem:   GI bleed Active Problems:   CKD (chronic kidney disease) stage 3, GFR 30-59 ml/min   Acute blood loss anemia   Acute on chronic diastolic (congestive) heart failure ([name])   Diabetes mellitus ([name])   Diastolic CHF ([name])   Atrial fibrillation ([name])   Chronic combined systolic and diastolic CHF (congestive heart failure) ([name])   Pulmonary hypertension ([name])   CKD (chronic kidney disease), stage III   History of GI diverticular bleed   Palliative care encounter   DNR (do not resuscitate) discussion   Rectal bleeding   CAD in native artery   Non-ST elevation myocardial infarction (NSTEMI) ([place])   Pressure ulcer    Results:   Labs: Results for orders placed or performed during the hospital encounter of 01/03/16 (from the past 30  hour(s))  Troponin I     Status: Abnormal   Collection Time: 01/16/16 10:40 AM  Result Value Ref Range   Troponin I 11.25 (HH) <0.031 ng/mL    Comment:        POSSIBLE MYOCARDIAL ISCHEMIA. SERIAL TESTING RECOMMENDED. CRITICAL VALUE NOTED.  VALUE IS CONSISTENT WITH PREVIOUSLY REPORTED AND CALLED VALUE.   Comprehensive metabolic panel     Status: Abnormal   Collection Time: 01/16/16 10:40 AM  Result Value Ref Range   Sodium 140 135 - 145 mmol/L   Potassium 3.6 3.5 - 5.1 mmol/L   Chloride 105 101 - 111 mmol/L   CO2 24 22 - 32 mmol/L   Glucose, Bld 237 (H) 65 - 99 mg/dL   BUN 24 (H) 6 - 20 mg/dL   Creatinine, Ser 2.07 (H) 0.61 - 1.24 mg/dL   Calcium 8.4 (L) 8.9 - 10.3 mg/dL   Total Protein 6.1 (L) 6.5 - 8.1 g/dL   Albumin 2.6 (L) 3.5 - 5.0 g/dL   AST 54 (H) 15 - 41 U/L   ALT 24 17 - 63 U/L   Alkaline Phosphatase 71 38 - 126 U/L   Total Bilirubin 1.3 (H) 0.3 - 1.2 mg/dL   GFR calc non Af Amer 28 (L) >60 mL/min   GFR calc  Af Amer 33 (L) >60 mL/min    Comment: (NOTE) The eGFR has been calculated using the CKD EPI equation. This calculation has not been validated in all clinical situations. eGFR's persistently <60 mL/min signify possible Chronic Kidney Disease.    Anion gap 11 5 - 15  Glucose, capillary     Status: Abnormal   Collection Time: 01/16/16 12:09 PM  Result Value Ref Range   Glucose-Capillary 226 (H) 65 - 99 mg/dL   Comment 1 Notify RN    Comment 2 Document in Chart   Glucose, capillary     Status: Abnormal   Collection Time: 01/16/16  3:58 PM  Result Value Ref Range   Glucose-Capillary 189 (H) 65 - 99 mg/dL   Comment 1 Notify RN    Comment 2 Document in Chart   Heparin level (unfractionated)     Status: None   Collection Time: 01/16/16  4:00 PM  Result Value Ref Range   Heparin Unfractionated 0.34 0.30 - 0.70 IU/mL    Comment:        IF HEPARIN RESULTS ARE BELOW EXPECTED VALUES, AND PATIENT DOSAGE HAS BEEN CONFIRMED, SUGGEST FOLLOW UP TESTING OF  ANTITHROMBIN III LEVELS.   Glucose, capillary     Status: Abnormal   Collection Time: 01/16/16  9:31 PM  Result Value Ref Range   Glucose-Capillary 151 (H) 65 - 99 mg/dL   Comment 1 Notify RN   Heparin level (unfractionated)     Status: None   Collection Time: 01/17/16  2:21 AM  Result Value Ref Range   Heparin Unfractionated 0.43 0.30 - 0.70 IU/mL    Comment:        IF HEPARIN RESULTS ARE BELOW EXPECTED VALUES, AND PATIENT DOSAGE HAS BEEN CONFIRMED, SUGGEST FOLLOW UP TESTING OF ANTITHROMBIN III LEVELS.   CBC     Status: Abnormal   Collection Time: 01/17/16  2:21 AM  Result Value Ref Range   WBC 10.1 4.0 - 10.5 K/uL   RBC 3.21 (L) 4.22 - 5.81 MIL/uL   Hemoglobin 9.3 (L) 13.0 - 17.0 g/dL   HCT 29.3 (L) 39.0 - 52.0 %   MCV 91.3 78.0 - 100.0 fL   MCH 29.0 26.0 - 34.0 pg   MCHC 31.7 30.0 - 36.0 g/dL   RDW 14.9 11.5 - 15.5 %   Platelets 230 150 - 400 K/uL  Glucose, capillary     Status: Abnormal   Collection Time: 01/17/16  8:29 AM  Result Value Ref Range   Glucose-Capillary 169 (H) 65 - 99 mg/dL  CBC     Status: Abnormal   Collection Time: 01/17/16  9:27 AM  Result Value Ref Range   WBC 10.3 4.0 - 10.5 K/uL   RBC 3.34 (L) 4.22 - 5.81 MIL/uL   Hemoglobin 9.7 (L) 13.0 - 17.0 g/dL   HCT 30.6 (L) 39.0 - 52.0 %   MCV 91.6 78.0 - 100.0 fL   MCH 29.0 26.0 - 34.0 pg   MCHC 31.7 30.0 - 36.0 g/dL   RDW 14.9 11.5 - 15.5 %   Platelets 235 150 - 400 K/uL  Glucose, capillary     Status: Abnormal   Collection Time: 01/17/16 12:00 PM  Result Value Ref Range   Glucose-Capillary 190 (H) 65 - 99 mg/dL  Glucose, capillary     Status: Abnormal   Collection Time: 01/17/16  6:22 PM  Result Value Ref Range   Glucose-Capillary 156 (H) 65 - 99 mg/dL  Glucose, capillary  Status: Abnormal   Collection Time: 01/17/16  9:33 PM  Result Value Ref Range   Glucose-Capillary 159 (H) 65 - 99 mg/dL   Comment 1 Notify RN    Comment 2 Document in Chart     Imaging / Studies: No results  found.  Medications / Allergies:  Scheduled Meds: . sodium chloride   Intravenous Once  . alvimopan  12 mg Oral BID  . aspirin EC  81 mg Oral Daily  . docusate sodium  100 mg Oral BID  . heparin  5,000 Units Subcutaneous 3 times per day  . insulin aspart  0-15 Units Subcutaneous TID WC  . insulin aspart  0-5 Units Subcutaneous QHS  . isosorbide mononitrate  60 mg Oral BID WC  . metoprolol tartrate  100 mg Oral BID  . pantoprazole  40 mg Oral BID  . polyethylene glycol  17 g Oral Daily   Continuous Infusions:  PRN Meds:.diphenhydrAMINE **OR** diphenhydrAMINE, HYDROmorphone (DILAUDID) injection, naloxone **AND** sodium chloride flush, ondansetron **OR** ondansetron (ZOFRAN) IV, oxyCODONE-acetaminophen  Antibiotics: Anti-infectives    Start     Dose/Rate Route Frequency Ordered Stop   01/14/16 0800  cefoTEtan (CEFOTAN) 2 g in dextrose 5 % 50 mL IVPB     2 g 100 mL/hr over 30 Minutes Intravenous  Once 01/13/16 1755 01/14/16 0930   01/13/16 1415  cefoTEtan (CEFOTAN) 2 g in dextrose 5 % 50 mL IVPB  Status:  Discontinued     2 g 100 mL/hr over 30 Minutes Intravenous Every 12 hours 01/13/16 1412 01/13/16 1718   01/13/16 1012  cefoTEtan in Dextrose 5% (CEFOTAN) 2-2.08 GM-% IVPB    Comments:  Leandrew Koyanagi   : cabinet override      01/13/16 1012 01/13/16 2229   01/13/16 0600  cefoTEtan (CEFOTAN) 2 g in dextrose 5 % 50 mL IVPB     2 g 100 mL/hr over 30 Minutes Intravenous On call to O.R. 01/12/16 1307 01/13/16 1112   01/12/16 1400  neomycin (MYCIFRADIN) tablet 1,000 mg     1,000 mg Oral 3 times per day 01/12/16 1307 01/12/16 2332   01/12/16 1400  erythromycin (E-MYCIN) tablet 1,000 mg     1,000 mg Oral 3 times per day 01/12/16 1307 01/12/16 2332         Assessment/Plan Recurrent lower GI bleed POD#5 exploratory laparotomy, splenic flexure mobilization, extended left hemicolectomy and sigmoidectomy---Dr. Rosendo Gros -tolerating diet, DC PCA and PO pain meds -needs to  mobilize ABLA-h&h are stable  Left knee pain/effusion-obtain XR.  Further management per primary team NSTEMI-cards following CAD/CHF/PAF FEN-no issues.  Colace/miralax.  Stop entereg.  VTE prophylaxis-SCD/heparin Dispo-stable for floor from a surgical standpoint   Erby Pian, [facility] Pager [phone](7A-4:30P) For consults and floor pages call [phone](7A-4:30P)  01/18/2016 8:27 AM

## 2016-01-18 NOTE — Progress Notes (Signed)
Mr. Whitsett is a pleasant 82 year old Caucasian male with past medical history of severe diverticulosis, multiple GI bleed since 1981, carotid artery disease s/p R CEA by [name] '94, HTN, HLD, DM, CKD stage IV, CAD s/p CABG 1996 with redo 2006 by [name], and h/o PAF not on coumadin due to recurrent GI bleed presented with recurrent GI bleed after recent NSTEMI on medical management.   Subjective:  No chest pain, no SOB. His main complaint is pain and swell in his Lt knee.   Objective:  Vital Signs in the last 24 hours: Temp:  [97.5 F (36.4 C)-99.1 F (37.3 C)] 97.9 F (36.6 C) (03/06 0542) Pulse Rate:  [83-108] 83 (03/06 0712) Resp:  [13-22] 18 (03/06 0756) BP: (103-142)/(48-98) 103/48 mmHg (03/06 0712) SpO2:  [93 %-100 %] 100 % (03/06 0756) Weight:  [184 lb 8.4 oz (83.7 kg)] 184 lb 8.4 oz (83.7 kg) (03/06 0500)  Intake/Output from previous day:  Intake/Output Summary (Last 24 hours) at 01/18/16 0911 Last data filed at 01/18/16 0600  Gross per 24 hour  Intake    340 ml  Output    402 ml  Net    -62 ml    Physical Exam: General appearance: alert, cooperative and no distress Lungs: clear to auscultation bilaterally Heart: regular rate and rhythm and soft systolic murmur LSB Extremities: no edema, Lt knee effusion, warm to touch Neurologic: Grossly normal   Rate: 88  Rhythm: atrial fibrillation and RBBB  Lab Results:  Recent Labs  01/17/16 0927 01/18/16 0758  WBC 10.3 9.3  HGB 9.7* 9.1*  PLT 235 214    Recent Labs  01/16/16 1040 01/18/16 0758  NA 140 138  K 3.6 3.5  CL 105 103  CO2 24 25  GLUCOSE 237* 230*  BUN 24* 18  CREATININE 2.07* 1.55*    Recent Labs  01/16/16 0527 01/16/16 1040  TROPONINI 13.67* 11.25*   No results for input(s): INR in the last 72 hours.  Scheduled Meds: . sodium chloride   Intravenous Once  . aspirin EC  81 mg Oral Daily  . docusate sodium  100 mg Oral BID  . heparin  5,000 Units Subcutaneous 3 times per  day  . insulin aspart  0-15 Units Subcutaneous TID WC  . insulin aspart  0-5 Units Subcutaneous QHS  . isosorbide mononitrate  60 mg Oral BID WC  . metoprolol tartrate  100 mg Oral BID  . pantoprazole  40 mg Oral BID  . polyethylene glycol  17 g Oral Daily   Continuous Infusions:  PRN Meds:.diphenhydrAMINE **OR** diphenhydrAMINE, HYDROmorphone (DILAUDID) injection, naloxone **AND** sodium chloride flush, ondansetron **OR** ondansetron (ZOFRAN) IV, oxyCODONE-acetaminophen   Imaging: Imaging results have been reviewed  Cardiac Studies: Echo 11/17/15 Study Conclusions  - Left ventricle: There is hypokinesis of the mid anterolateral , anterior and distal lateral and anterior walls. The cavity size was normal. Systolic function was normal. The estimated ejection fraction was in the range of 50% to 55%. Wall motion was normal; there were no regional wall motion abnormalities. Features are consistent with a pseudonormal left ventricular filling pattern, with concomitant abnormal relaxation and increased filling pressure (grade 2 diastolic dysfunction). Doppler parameters are consistent with elevated ventricular end-diastolic filling pressure. - Aortic root: The aortic root was normal in size. - Mitral valve: There was mild to moderate regurgitation. - Left atrium: The atrium was moderately dilated. - Right ventricle: Systolic function was normal. - Tricuspid valve: Structurally normal valve. There was  moderate regurgitation. - Pulmonic valve: There was no regurgitation. - Pulmonary arteries: Systolic pressure was moderately increased. PA peak pressure: 51 mm Hg (S). - Inferior vena cava: The vessel was normal in size. - Pericardium, extracardiac: There was no pericardial effusion.    Assessment/Plan:  82 year old male with past medical history of severe diverticulosis, multiple GI bleed since 1981, carotid artery disease s/p R CEA by [name] '94, HTN, HLD,  DM, CKD stage IV, CAD s/p CABG 1996 with redo 2006 by [name], and h/o PAF- not on coumadin due to recurrent GI bleeds. He presented 01/03/16 with recurrent GI bleed. He underwent Lt hemicolectomy [id number] complicated by by peri-op NSTEMI (Troponin peak-16 on 01/16/16). Plan is for medical Rx.   Principal Problem:   Acute blood loss anemia Active Problems:   NSTEMI (non-ST elevated myocardial infarction) ([place])   History of GI diverticular bleed   Type 2 diabetes mellitus with peripheral neuropathy ([name])   CKD (chronic kidney disease) stage 3, GFR 30-59 ml/min   Hx of CABG   Acute-on-chronic kidney injury ([place])   Atrial fibrillation ([name])   Diastolic dysfunction-grade 2 with EF 50-55%   Effusion of left knee joint   DNR (do not resuscitate) discussion   Pressure ulcer   PLAN: Pt on his way to XR for knee films. On ASA Lopressor, and Imdur. Consider post MI echo for LVF. MD to see.   Kerin Ransom PA-C 01/18/2016, 9:11 AM [phone]  I seen and evaluated the patient this at noon along with Kerin Ransom, PA-C. Together we reviewed the patient's chart and available findings. We discussed his examination and recommendations. Known coronary disease is extensive and complicated including redo CABG in 2006 along with PAF/chronic A. fib who is not on any coagulation because of GI bleeding. He was admitted for GI bleed followed by hemicolectomy -- this was complicated by peri-infarct non-STEMI that is being treated medically.  Status post left hemicolectomy and sigmoidectomy for pancolonic diverticulosis. No further bleeding. Perioperative MI with preserved EF on recent echocardiogram. Quite likely he does have some underlying coronary disease which is well known, would continue medical management based on recommendations and by patient's wishes. He is on Imdur at 60 mg, now metoprolol at 100 twice a day. No further chest pain and no heart failure symptoms. I agree with plans for noninvasive treatment  and management.  Maintaining stable hemoglobin  I do agree with the recheck an echocardiogram to ensure that the EF is not significantly reduced, as this would adjust her medications.  It does seem like his only major complaint now is his knee pain. Is now restarted on colchicine was presumably gout. He does not have any significant pedal edema.  Seems relatively stable from cardiac standpoint. Remainder of his potential ischemic evaluation can be done as an outpatient once he is stable for discharge.   Leonie Man, M.D., M.S. Interventional Cardiologist   Pager # [phone] Phone # [phone] [address] [place], [place] [address]

## 2016-01-18 NOTE — Progress Notes (Addendum)
PROGRESS NOTE  [name] [id number] DOB: 1932/10/05 DOA: 01/03/2016 PCP: [name], MD Outpatient Specialists:    LOS: 15 days   Brief Narrative: [name] is an 82 year old male who was admitted on 01/03/2016 with chest pain and BRBPR x 3-4 days. History of diverticular bleeding with surgical intervention. Patient underwent full evaluation by gastroenterology, he has ongoing bleeding, Gen. surgery was consulted and patient will be taken to the operating room on 3/1. Cardiology was consulted for preop clearance given ongoing chest pains. Developed non-ST elevation MI postoperatively  Assessment & Plan: Principal Problem:   Acute blood loss anemia Active Problems:   Type 2 diabetes mellitus with peripheral neuropathy ([name])   CKD (chronic kidney disease) stage 3, GFR 30-59 ml/min   Hx of CABG   Acute-on-chronic kidney injury ([place])   NSTEMI (non-ST elevated myocardial infarction) ([place])   Atrial fibrillation ([name])   History of GI diverticular bleed   DNR (do not resuscitate) discussion   Pressure ulcer   Diastolic dysfunction-grade 2 with EF 50-55%   Effusion of left knee joint   Acute GI bleeding   Acute GI bleed/Acute blood loss anemia - Recurrent diverticular bleed despite cessation of Brilinta 5-6 weeks ago. - Colonoscopy 01/05/2016 revealed diverticulosis: severe in the left colon, moderate in the transverse colon and mild in the right colon. No evidence of active bleeding or old blood. - Nuclear RBC scan 01/10/2016 revealed increased activity in the left mid-abd, likely the distal transverse colon and proximal descending colon consistent with acute hemorrhage. - Cardiology cleared patient for surgery without further testing; patient is a high-risk surgical candidate - Suspected LGIB from diverticuli;open left hemicolectomy and sigmoidectomy  3/1 - received a total of 6 units of packed red blood cells this admission NG tube discontinued, started on a heart healthy  diet Hemoglobin change from 9.2> 7.7, > 9.3> 9.7>9.1 No recurrent bleeding Hospital course complicated by non-ST elevation MI postoperatively  DC PCA and PO pain meds  Left knee pain Mild suprapatellar joint effusion. Severe degenerative joint disease of patellofemoral space, may need outpatient orthopedic cortisone injection Restarted colchicine   Acute blood loss anemia Status post transfusion of 6 units of packed red blood cells this admission, Follow CBC closely, goal hemoglobin greater than 9.0 in the setting of non-ST elevation MI  CKD stage 3 - Appears euvolemic. Cr improving  Chronic combined systolic and diastolic CHF/Acute on chronic diastolic (congestive) heart failure - Appears euvolemic - Cardiology following  Chest pain with coronary artery disease as well as NSTEMI. Now with suspected recurrent non-ST elevation MI ,CAD s/p CABG 1996 with redo 2006 by [name].  likely also has significant underlying coronary artery disease given persistent ST depression in the lateral leads, has anterior wall motion abnormality seen on recent echocardiogram despite having a stable EF of 50-55%. ECG post op   slightly more pronounced ST depression. Developed recurrent anginal symptoms and found to have recurrent non-ST elevation MI postoperatively Managed by cardiology with  metoprolol, nitroglycerin, heparin, . Troponin peak 16 now decreasing c/w recurrent NSTEMI.  weaned off nitroglycerin --Continue isosorbide 60 mg  metoprolol to 100 mg twice a day --Stopped  IV heparin   - treated for non-ST elevation myocardial infarction.  --At this time, no cardiac catheterization. Continue with aggressive medical management. Chest pain-free post MI echo for LVF  Diabetes mellitus - HgbA1c: 6.0 on arrival - Continue SSI  History of Atrial fibrillation  - No previous anticoagulation due to recurrent GIB -  Current NSR  Essential hypertension - Blood pressure well controlled.  -  Continue metoprolol   carotid artery disease s/p R CEA by [name] '94   DVT prophylaxis: SCDs,  heparin Code Status: Full Family Communication: Patient's daughter is by the bedside  Disposition Plan: Transfer patient to Telemetry     Consultants:   Gastroenterology  Cardiology  Surgery  Palliative   Procedures:   Colonoscopy 01/05/2016  Antimicrobials:  None   Subjective: Complaining of left knee pain. Cant bear weight   Objective: Filed Vitals:   01/18/16 0542 01/18/16 0712 01/18/16 0756 01/18/16 1200  BP:  103/48    Pulse:  83    Temp: 97.9 F (36.6 C) 98.9 F (37.2 C)  98.9 F (37.2 C)  TempSrc: Oral Oral  Oral  Resp:  16 18   Height:      Weight:      SpO2:  98% 100%     Intake/Output Summary (Last 24 hours) at 01/18/16 1400 Last data filed at 01/18/16 1230  Gross per 24 hour  Intake    100 ml  Output    402 ml  Net   -302 ml   Filed Weights   01/16/16 0405 01/17/16 0315 01/18/16 0500  Weight: 83.5 kg (184 lb 1.4 oz) 83.8 kg (184 lb 11.9 oz) 83.7 kg (184 lb 8.4 oz)   Examination: BP 103/48 mmHg  Pulse 83  Temp(Src) 98.9 F (37.2 C) (Oral)  Resp 18  Ht 5\' 9"  (1.753 m)  Wt 83.7 kg (184 lb 8.4 oz)  BMI 27.24 kg/m2  SpO2 100%   General: Pleasant, NAD. Currently pain free Neuro: Alert and oriented X 3. Moves all extremities spontaneously. Psych: Normal affect. HEENT: Normal Neck: Supple without bruits or JVD. Lungs: Resp regular and unlabored, CTA. Heart: RRR 1-2/6 sem no s3. Abdomen: Midline incision noted, covered with clear dressing, no obvious active bleeding, some dried blood. No bowel sound Extremities: No clubbing, cyanosis or edema. DP/PT/Radials 2+ and equal bilaterally.  Data Reviewed: I have personally reviewed following labs and imaging studies  CBC:  Recent Labs Lab 01/15/16 2332 01/16/16 0527 01/17/16 0221 01/17/16 0927 01/18/16 0758  WBC 13.9* 11.8* 10.1 10.3 9.3  HGB 9.3* 9.3* 9.3* 9.7* 9.1*    HCT 29.0* 29.3* 29.3* 30.6* 28.4*  MCV 91.5 92.1 91.3 91.6 91.3  PLT 191 206 230 235 [id number]   Basic Metabolic Panel:  Recent Labs Lab 01/13/16 0613 01/14/16 0435 01/15/16 0520 01/16/16 1040 01/18/16 0758  NA 141 141 143 140 138  K 3.6 3.6 3.8 3.6 3.5  CL 102 108 109 105 103  CO2 28 24 24 24 25   GLUCOSE 161* 262* 152* 237* 230*  BUN 28* 23* 24* 24* 18  CREATININE 1.83* 2.05* 1.94* 2.07* 1.55*  CALCIUM 8.9 8.4* 8.7* 8.4* 8.5*  MG 2.1 1.9  --   --   --    GFR: Estimated Creatinine Clearance: 37.4 mL/min (by C-G formula based on Cr of 1.55). Liver Function Tests:  Recent Labs Lab 01/15/16 0520 01/16/16 1040  AST 63* 54*  ALT 23 24  ALKPHOS 63 71  BILITOT 0.7 1.3*  PROT 5.7* 6.1*  ALBUMIN 2.7* 2.6*   No results for input(s): LIPASE, AMYLASE in the last 168 hours. No results for input(s): AMMONIA in the last 168 hours. Coagulation Profile:  Recent Labs Lab 01/13/16 0613  INR 1.18   Cardiac Enzymes:  Recent Labs Lab 01/15/16 0520 01/15/16 0950 01/15/16 2332 01/16/16 0527 01/16/16 1040  TROPONINI 8.73* 12.06* 16.00* 13.67* 11.25*   BNP (last 3 results) No results for input(s): PROBNP in the last 8760 hours. HbA1C: No results for input(s): HGBA1C in the last 72 hours. CBG:  Recent Labs Lab 01/17/16 1822 01/17/16 2133 01/18/16 0540 01/18/16 0746 01/18/16 1236  GLUCAP 156* 159* 208* 212* 171*   Lipid Profile: No results for input(s): CHOL, HDL, LDLCALC, TRIG, CHOLHDL, LDLDIRECT in the last 72 hours. Thyroid Function Tests: No results for input(s): TSH, T4TOTAL, FREET4, T3FREE, THYROIDAB in the last 72 hours. Anemia Panel: No results for input(s): VITAMINB12, FOLATE, FERRITIN, TIBC, IRON, RETICCTPCT in the last 72 hours. Urine analysis:    Component Value Date/Time   COLORURINE YELLOW 11/14/2015 [place] 11/14/2015 0241   LABSPEC 1.018 11/14/2015 0241   PHURINE 5.0 11/14/2015 0241   GLUCOSEU NEGATIVE 11/14/2015 0241   HGBUR  NEGATIVE 11/14/2015 0241   HGBUR negative 09/12/2007 1028   BILIRUBINUR NEGATIVE 11/14/2015 0241   KETONESUR NEGATIVE 11/14/2015 0241   PROTEINUR NEGATIVE 11/14/2015 0241   UROBILINOGEN 0.2 06/14/2014 2210   NITRITE NEGATIVE 11/14/2015 0241   LEUKOCYTESUR NEGATIVE 11/14/2015 0241   Sepsis Labs: Invalid input(s): PROCALCITONIN, LACTICIDVEN  Recent Results (from the past 240 hour(s))  Surgical pcr screen     Status: None   Collection Time: 01/13/16  6:19 AM  Result Value Ref Range Status   MRSA, PCR NEGATIVE NEGATIVE Final   Staphylococcus aureus NEGATIVE NEGATIVE Final    Comment:        The Xpert SA Assay (FDA approved for NASAL specimens in patients over 61 years of age), is one component of a comprehensive surveillance program.  Test performance has been validated by [facility]) for patients greater than or equal to 68 year old. It is not intended to diagnose infection nor to guide or monitor treatment.       Radiology Studies: Dg Knee Complete 4 Views Left  01/18/2016  CLINICAL DATA:  Left knee pain and swelling for 1 week. EXAM: LEFT KNEE - COMPLETE 4+ VIEW COMPARISON:  None. FINDINGS: No fracture or dislocation is noted. Mild suprapatellar joint effusion is noted. Severe narrowing of patellofemoral space is noted. Mild osteophyte formation is noted medially and laterally. Vascular calcifications are noted. IMPRESSION: Mild suprapatellar joint effusion. Severe degenerative joint disease of patellofemoral space. No fracture or dislocation is noted. Electronically Signed   By: Marijo Conception, M.D.   On: 01/18/2016 09:29     Scheduled Meds: . sodium chloride   Intravenous Once  . aspirin EC  81 mg Oral Daily  . docusate sodium  100 mg Oral BID  . heparin  5,000 Units Subcutaneous 3 times per day  . insulin aspart  0-15 Units Subcutaneous TID WC  . insulin aspart  0-5 Units Subcutaneous QHS  . isosorbide mononitrate  60 mg Oral BID WC  . metoprolol tartrate  100 mg  Oral BID  . pantoprazole  40 mg Oral BID  . polyethylene glycol  17 g Oral Daily   Continuous Infusions:  None.   Reyne Dumas , MD,  [facility] Pager [phone]  If 7PM-7AM, please contact night-coverage [email or web address] Password [email or web address] 01/18/2016, 2:00 PM

## 2016-01-18 NOTE — Progress Notes (Signed)
Physical Therapy Treatment Patient Details Name: [name] MRN: [id number] DOB: [birth year] Today's Date: 01/18/2016    History of Present Illness Pt presents for recurrent GI bleed after NSTEMI in 1/17, underwent ex lap on 3/3 with troponin trending up after surgery, has extensive cardiac history.    PT Comments    [name] was limited due to pain from gout flare up in Lt LE (9/10 pain).  Attempted ambulation but pt declined ambulating in hallway due to pain.  Pt completed therapeutic exercises sitting in recliner chair.  Pt will benefit from continued skilled PT services to increase functional independence and safety.   Follow Up Recommendations  SNF;Supervision/Assistance - 24 hour     Equipment Recommendations  Rolling walker with 5" wheels    Recommendations for Other Services OT consult     Precautions / Restrictions Precautions Precautions: Fall Precaution Comments: pt with new left foot drop (due to gout Lt LE?), abdominal incision Restrictions Weight Bearing Restrictions: No    Mobility  Bed Mobility               General bed mobility comments: Pt sitting in recliner chair upon PT arrival  Transfers Overall transfer level: Needs assistance Equipment used: Rolling walker (2 wheeled) Transfers: Sit to/from Stand Sit to Stand: Min assist         General transfer comment: Min assist to boost up to standing and to control descent to recliner chair.  Pt slow to stand due to Lt knee pain.  Ambulation/Gait Ambulation/Gait assistance: Min guard Ambulation Distance (Feet): 5 Feet Assistive device: Rolling walker (2 wheeled) Gait Pattern/deviations: Decreased stride length;Decreased stance time - left;Antalgic;Trunk flexed   Gait velocity interpretation: <1.8 ft/sec, indicative of risk for recurrent falls General Gait Details: Limited ambulatory distance today due to severe (9/10) Lt knee pain due to gout.  Pt declines ambulating in hallway and requests  to return to chair after taking a few steps in room.   Stairs            Wheelchair Mobility    Modified Rankin (Stroke Patients Only)       Balance Overall balance assessment: Needs assistance Sitting-balance support: Feet supported;No upper extremity supported Sitting balance-Leahy Scale: Good     Standing balance support: Bilateral upper extremity supported;During functional activity Standing balance-Leahy Scale: Poor Standing balance comment: Relies on RW for support and pain relief                    Cognition Arousal/Alertness: Awake/alert Behavior During Therapy: WFL for tasks assessed/performed Overall Cognitive Status: Within Functional Limits for tasks assessed                      Exercises General Exercises - Lower Extremity Ankle Circles/Pumps: AROM;Both;10 reps;Seated Quad Sets: Strengthening;Right;10 reps;Seated Gluteal Sets: Strengthening;Both;10 reps;Seated Long Arc Quad: Right;10 reps;Seated;AROM    General Comments        Pertinent Vitals/Pain Pain Assessment: 0-10 Pain Score: 9  Pain Location: From Lt knee down Pain Descriptors / Indicators: Aching;Grimacing;Guarding;Constant;Sharp Pain Intervention(s): Limited activity within patient's tolerance;Monitored during session    Home Living                      Prior Function            PT Goals (current goals can now be found in the care plan section) Acute Rehab PT Goals Patient Stated Goal: get stronger; decrease pain PT Goal Formulation:  With patient Time For Goal Achievement: 01/30/16 Potential to Achieve Goals: Good Progress towards PT goals: Not progressing toward goals - comment (due to gout flare up)    Frequency  Min 3X/week    PT Plan Current plan remains appropriate    Co-evaluation             End of Session Equipment Utilized During Treatment: Gait belt Activity Tolerance: Patient limited by pain Patient left: in chair;with call  bell/phone within reach     Time: [id number] PT Time Calculation (min) (ACUTE ONLY): 15 min  Charges:  $Therapeutic Exercise: 8-22 mins                    G Codes:      [name] PT, DPT  Pager: [phone] Phone: [phone] 01/18/2016, 4:56 PM

## 2016-01-18 NOTE — Care Management Note (Signed)
Case Management Note  Patient Details  Name: AARONJAMES ENCISO MRN: [id number] Date of Birth: [birth year]  Subjective/Objective:   Patient is post op explor lap with hemicolectomy and sigmoidectomy, with ongoing chest pain, developed NSTEMI, Cards following.  Pt eval rec SNF for patient, CSW aware. NCM will continue to follow for dc needs.                 Action/Plan:   Expected Discharge Date:  01/05/16               Expected Discharge Plan:  Skilled Nursing Facility  In-House Referral:  Clinical Social Work  Discharge planning Services  CM Consult  Post Acute Care Choice:    Choice offered to:     DME Arranged:    DME Agency:     HH Arranged:    [place] Agency:     Status of Service:  In process, will continue to follow  Medicare Important Message Given:  Yes Date Medicare IM Given:    Medicare IM give by:    Date Additional Medicare IM Given:    Additional Medicare Important Message give by:     If discussed at [place] of Stay Meetings, dates discussed:    Additional Comments:  Zenon Mayo, RN 01/18/2016, 2:55 PM

## 2016-01-18 NOTE — Progress Notes (Signed)
Nutrition Follow-up  DOCUMENTATION CODES:   Not applicable  INTERVENTION:    Continue heart healthy diet to meet nutrition needs.  NUTRITION DIAGNOSIS:   Inadequate oral intake related to altered GI function as evidenced by  (NPO/clear liquid diet x 9 days).  Resolved.  GOAL:   Patient will meet greater than or equal to 90% of their needs  Met.  MONITOR:   PO intake, Supplement acceptance, Diet advancement, Labs, Weight trends, Skin, I & O's  ASSESSMENT:   [name] is an 82 year old male who was admitted on 01/03/2016 with chest pain and BRBPR x 3-4 days. History of diverticular bleeding with surgical intervention.  S/P left hemicolectomy on 3/1 complicated by peri-op NSTEMI. Plan is for medical Rx. Diet has been advanced to heart healthy. Patient taking bath, did not disturb. Spoke with RN who reports patient likes the food and has been eating 100% of meals. No nutrition supplements needed at this time.  Diet Order:  Diet Heart Room service appropriate?: Yes; Fluid consistency:: Thin  Skin:  Reviewed, no issues  Last BM:  01/12/16  Height:   Ht Readings from Last 1 Encounters:  01/03/16 _0  (1.753 m)    Weight:   Wt Readings from Last 1 Encounters:  01/18/16 184 lb 8.4 oz (83.7 kg)    Ideal Body Weight:  72.7 kg  BMI:  Body mass index is 27.24 kg/(m^2).  Estimated Nutritional Needs:   Kcal:  1800-2000  Protein:  95-110 grams  Fluid:  1.8-2.0 L  EDUCATION NEEDS:   No education needs identified at this time  Molli Barrows, [place], [place], [place] Pager [phone] After Hours Pager [phone]

## 2016-01-19 ENCOUNTER — Inpatient Hospital Stay (HOSPITAL_COMMUNITY): Payer: Medicare Other

## 2016-01-19 DIAGNOSIS — R079 Chest pain, unspecified: Secondary | ICD-10-CM

## 2016-01-19 DIAGNOSIS — K922 Gastrointestinal hemorrhage, unspecified: Secondary | ICD-10-CM

## 2016-01-19 DIAGNOSIS — M109 Gout, unspecified: Secondary | ICD-10-CM | POA: Diagnosis not present

## 2016-01-19 LAB — COMPREHENSIVE METABOLIC PANEL
ALBUMIN: 2 g/dL — AB (ref 3.5–5.0)
ALK PHOS: 55 U/L (ref 38–126)
ALT: 18 U/L (ref 17–63)
ANION GAP: 10 (ref 5–15)
AST: 19 U/L (ref 15–41)
BUN: 15 mg/dL (ref 6–20)
CALCIUM: 8.3 mg/dL — AB (ref 8.9–10.3)
CHLORIDE: 104 mmol/L (ref 101–111)
CO2: 23 mmol/L (ref 22–32)
Creatinine, Ser: 1.62 mg/dL — ABNORMAL HIGH (ref 0.61–1.24)
GFR calc non Af Amer: 38 mL/min — ABNORMAL LOW (ref >60)
GFR, EST AFRICAN AMERICAN: 44 mL/min — AB (ref >60)
GLUCOSE: 172 mg/dL — AB (ref 65–99)
Potassium: 3.4 mmol/L — ABNORMAL LOW (ref 3.5–5.1)
SODIUM: 137 mmol/L (ref 135–145)
Total Bilirubin: 0.8 mg/dL (ref 0.3–1.2)
Total Protein: 5 g/dL — ABNORMAL LOW (ref 6.5–8.1)

## 2016-01-19 LAB — CBC
HCT: 25.8 % — ABNORMAL LOW (ref 39.0–52.0)
HEMOGLOBIN: 8.2 g/dL — AB (ref 13.0–17.0)
MCH: 28.8 pg (ref 26.0–34.0)
MCHC: 31.8 g/dL (ref 30.0–36.0)
MCV: 90.5 fL (ref 78.0–100.0)
PLATELETS: 195 10*3/uL (ref 150–400)
RBC: 2.85 MIL/uL — AB (ref 4.22–5.81)
RDW: 15.2 % (ref 11.5–15.5)
WBC: 8.3 10*3/uL (ref 4.0–10.5)

## 2016-01-19 LAB — TYPE AND SCREEN
ABO/RH(D): O NEG
Antibody Screen: POSITIVE
DAT, IgG: NEGATIVE
DONOR AG TYPE: NEGATIVE
Donor AG Type: NEGATIVE
UNIT DIVISION: 0
UNIT DIVISION: 0

## 2016-01-19 LAB — GLUCOSE, CAPILLARY
GLUCOSE-CAPILLARY: 161 mg/dL — AB (ref 65–99)
GLUCOSE-CAPILLARY: 167 mg/dL — AB (ref 65–99)
GLUCOSE-CAPILLARY: 144 mg/dL — AB (ref 65–99)
Glucose-Capillary: 161 mg/dL — ABNORMAL HIGH (ref 65–99)

## 2016-01-19 LAB — PREPARE RBC (CROSSMATCH)

## 2016-01-19 MED ORDER — SODIUM CHLORIDE 0.9 % IV SOLN
Freq: Once | INTRAVENOUS | Status: AC
  Administered 2016-01-19: 17:00:00 via INTRAVENOUS

## 2016-01-19 NOTE — Progress Notes (Signed)
PT Cancellation Note  Patient Details Name: DAILEN MILNE MRN: [id number] DOB: 10-27-1932   Cancelled Treatment:    Reason Eval/Treat Not Completed: Other (comment). Attempted x 2. Pt had just gotten back in bed and then pt fatigued and wanted to wait to receive blood. Reports gout some better but still with difficulty amb.   Zamariah Seaborn 01/19/2016, 3:26 PM [employer] PT [phone]

## 2016-01-19 NOTE — Progress Notes (Signed)
Daily Progress Note   Patient Name: [name]       Date: 01/19/2016 DOB: 10-23-1932  Age: 82 y.o. MRN#: [id number] Attending Physician: Reyne Dumas, MD Primary Care Physician: [name], MD Admit Date: 01/03/2016  Reason for Consultation/Follow-up: Establishing goals of care and Psychosocial/spiritual support  Subjective:  - continued emotional support to Mr Panis, he continues to progress post operatively.  Discussed importance of movement and mobility, demonstrated chair exercises and stretches  -  C/o left knee and bilaterally great tow pain and tells me his Colchicine/home med has been stopped, will reorder    Will continue to support holistically, family is aware of how to contact me with questions or concerns   Length of Stay: 16 days  Current Medications: Scheduled Meds:  . sodium chloride   Intravenous Once  . aspirin EC  81 mg Oral Daily  . colchicine  0.6 mg Oral BID  . docusate sodium  100 mg Oral BID  . heparin  5,000 Units Subcutaneous 3 times per day  . insulin aspart  0-15 Units Subcutaneous TID WC  . insulin aspart  0-5 Units Subcutaneous QHS  . isosorbide mononitrate  60 mg Oral BID WC  . metoprolol tartrate  100 mg Oral BID  . pantoprazole  40 mg Oral BID  . polyethylene glycol  17 g Oral Daily    Continuous Infusions:    PRN Meds: diphenhydrAMINE **OR** diphenhydrAMINE, HYDROmorphone (DILAUDID) injection, naloxone **AND** sodium chloride flush, ondansetron **OR** ondansetron (ZOFRAN) IV, oxyCODONE-acetaminophen  Physical Exam: Physical Exam  Constitutional: He is oriented to person, place, and time. He appears well-developed.  HENT:  Head: Normocephalic.  Cardiovascular: Normal rate and regular rhythm.   Neurological: He is alert and  oriented to person, place, and time.  Skin: Skin is warm and dry.                Vital Signs: BP 128/66 mmHg  Pulse 81  Temp(Src) 100 F (37.8 C) (Oral)  Resp 18  Ht 5\' 9"  (1.753 m)  Wt 183 lb 8 oz (83.235 kg)  BMI 27.09 kg/m2  SpO2 97% SpO2: SpO2: 97 % O2 Device: O2 Device: Not Delivered O2 Flow Rate: O2 Flow Rate (L/min): 2 L/min  Intake/output summary:   Intake/Output Summary (Last 24 hours) at 01/19/16 [id number] Last  data filed at 01/19/16 0600  Gross per 24 hour  Intake    240 ml  Output    750 ml  Net   -510 ml   LBM: Last BM Date: 01/17/16 Baseline Weight: Weight: 186 lb 1.1 oz (84.4 kg) Most recent weight: Weight: 183 lb 8 oz (83.235 kg)       Palliative Assessment/Data: Flowsheet Rows        Most Recent Value   Intake Tab    Referral Department  Hospitalist   Unit at Time of Referral  Med/Surg Unit   Palliative Care Primary Diagnosis  Cardiac   Date Notified  01/08/16   Palliative Care Type  New Palliative care   Reason for referral  Clarify Goals of Care   Date of Admission  01/03/16   Date first seen by Palliative Care  01/10/16   # of days Palliative referral response time  2 Day(s)   # of days IP prior to Palliative referral  5   Clinical Assessment    Psychosocial & Spiritual Assessment    Palliative Care Outcomes       Additional Data Reviewed: CBC    Component Value Date/Time   WBC 9.3 01/18/2016 0758   RBC 3.11* 01/18/2016 0758   HGB 9.1* 01/18/2016 0758   HCT 28.4* 01/18/2016 0758   PLT 214 01/18/2016 0758   MCV 91.3 01/18/2016 0758   MCH 29.3 01/18/2016 0758   MCHC 32.0 01/18/2016 0758   RDW 15.0 01/18/2016 0758   LYMPHSABS 1.8 01/10/2016 0634   MONOABS 0.7 01/10/2016 0634   EOSABS 0.2 01/10/2016 0634   BASOSABS 0.0 01/10/2016 0634    CMP     Component Value Date/Time   NA 138 01/18/2016 0758   K 3.5 01/18/2016 0758   CL 103 01/18/2016 0758   CO2 25 01/18/2016 0758   GLUCOSE 230* 01/18/2016 0758   BUN 18 01/18/2016 0758    BUN 27* 04/11/2013   CREATININE 1.55* 01/18/2016 0758   CALCIUM 8.5* 01/18/2016 0758   PROT 6.1* 01/16/2016 1040   ALBUMIN 2.6* 01/16/2016 1040   AST 54* 01/16/2016 1040   ALT 24 01/16/2016 1040   ALKPHOS 71 01/16/2016 1040   BILITOT 1.3* 01/16/2016 1040   GFRNONAA 40* 01/18/2016 0758   GFRAA 47* 01/18/2016 0758       Problem List:  Patient Active Problem List   Diagnosis Date Noted  . Acute gout 01/19/2016  . Pressure ulcer 01/18/2016  . Diastolic dysfunction-grade 2 with EF 50-55% 01/18/2016  . Effusion of left knee joint 01/18/2016  . Acute GI bleeding   . Rectal bleeding   . Palliative care encounter   . DNR (do not resuscitate) discussion   . History of GI diverticular bleed   . Diabetes mellitus ([place]) 01/03/2016  . Diastolic CHF ([place]) [id number]  . Atrial fibrillation ([place]) 01/03/2016  . Angina at rest [facility])   . Chronic combined systolic and diastolic CHF (congestive heart failure) ([place])   . Pulmonary hypertension ([place])   . Pneumonia 12/01/2015  . Acute on chronic diastolic (congestive) heart failure ([place]) 11/20/2015  . Hypokalemia 11/18/2015  . HCAP (healthcare-associated pneumonia) 11/17/2015  . NSTEMI (non-ST elevated myocardial infarction) ([place])   . Diverticulosis of colon with hemorrhage   . GI bleed 11/13/2015  . Symptomatic anemia 11/13/2015  . Acute-on-chronic kidney injury ([place])   . Acute blood loss anemia   . Tremor 09/01/2015  . Gout 09/09/2014  . Medicare annual wellness visit, initial 09/09/2014  .  Advance care planning 09/09/2014  . Ejection fraction   . Heme positive stool 06/19/2014  . Long-term (current) use of anticoagulants   . RBBB (right bundle branch block)   . Carotid artery disease ([place])   . Hx of CABG   . History of gastrointestinal bleeding   . Mitral regurgitation   . ED (erectile dysfunction) 03/01/2012  . CKD (chronic kidney disease) stage 3, GFR 30-59 ml/min 03/24/2011  . Atrial flutter ([place]) 08/24/2010  . Paroxysmal  atrial fibrillation ([place]) 08/09/2010  . Peripheral sensory neuropathy due to type 2 diabetes mellitus ([place])   . GERD   . Sleep apnea   . Type 2 diabetes mellitus with peripheral neuropathy ([name])   . Hyperlipidemia   . Hypertensive heart disease      Palliative Care Assessment & Plan    1.Code Status:  Full code      Code Status Orders        Start     Ordered   01/03/16 0413  Full code   Continuous     01/03/16 0412    Code Status History    Date Active Date Inactive Code Status Order ID Comments User Context   11/13/2015  4:46 PM 11/21/2015  3:50 PM Full Code [id number]  Kelvin Cellar, MD Inpatient   06/14/2014  5:44 PM 06/19/2014  7:02 PM Full Code [id number]  Belva Crome, MD Inpatient   06/13/2014 12:04 PM 06/14/2014  5:44 PM Full Code [id number]  Belva Crome, MD Inpatient   06/11/2014  4:26 PM 06/13/2014 12:04 PM Full Code [id number]  Belva Crome, MD Inpatient   06/08/2014 11:55 PM 06/11/2014  4:26 PM Full Code [id number]  Jacolyn Reedy, MD Inpatient       Thank you for allowing the Palliative Medicine Team to assist in the care of this patient. Discussed with Dr Allyson Sabal  Time In: 1500 Time Out: 1520 Total Time  2 min Prolonged Time Billed  no         Knox Royalty, NP  01/19/2016, 9:21 AM  Please contact Palliative Medicine Team phone at [phone] for questions and concerns.

## 2016-01-19 NOTE — Care Management Important Message (Signed)
Important Message  Patient Details  Name: [name] MRN: [id number] Date of Birth: 12-30-1931   Medicare Important Message Given:  Yes    Loann Quill 01/19/2016, 8:42 AM

## 2016-01-19 NOTE — Progress Notes (Signed)
  Echocardiogram 2D Echocardiogram has been performed.  [name] 01/19/2016, 4:19 PM

## 2016-01-19 NOTE — Progress Notes (Addendum)
PROGRESS NOTE  [name] [id number] DOB: May 02, 1932 DOA: 01/03/2016 PCP: [name], MD Outpatient Specialists:    LOS: 16 days   Brief Narrative: [name] is an 82 year old male who was admitted on 01/03/2016 with chest pain and BRBPR x 3-4 days. History of diverticular bleeding with surgical intervention. Patient underwent full evaluation by gastroenterology, he has ongoing bleeding, Gen. surgery was consulted and patient will be taken to the operating room on 3/1. Cardiology was consulted for preop clearance given ongoing chest pains. Developed non-ST elevation MI postoperatively  Assessment & Plan: Principal Problem:   Acute blood loss anemia Active Problems:   Type 2 diabetes mellitus with peripheral neuropathy ([name])   CKD (chronic kidney disease) stage 3, GFR 30-59 ml/min   Hx of CABG   Acute-on-chronic kidney injury ([place])   NSTEMI (non-ST elevated myocardial infarction) ([place])   Atrial fibrillation ([name])   History of GI diverticular bleed   DNR (do not resuscitate) discussion   Pressure ulcer   Diastolic dysfunction-grade 2 with EF 50-55%   Effusion of left knee joint   Acute GI bleeding   Acute gout   Acute GI bleed/Acute blood loss anemia - Recurrent diverticular bleed despite cessation of Brilinta 5-6 weeks ago. - Colonoscopy 01/05/2016 revealed diverticulosis: severe in the left colon, moderate in the transverse colon and mild in the right colon. No evidence of active bleeding or old blood. - Nuclear RBC scan 01/10/2016 revealed increased activity in the left mid-abd, likely the distal transverse colon and proximal descending colon consistent with acute hemorrhage. - Cardiology cleared patient for surgery without further testing; patient is a high-risk surgical candidate - Suspected LGIB from diverticuli;open left hemicolectomy and sigmoidectomy  3/1 - received a total of 6 units of packed red blood cells this admission tolerating diet, DC PCA and PO pain  meds -Doing well from GI surgical standpoint. Hemoglobin change from 9.2> 7.7, > 9.3> 9.7>9.1>8.2 No recurrent bleeding Hospital course complicated by non-ST elevation MI postoperatively   Left knee pain Mild suprapatellar joint effusion. Severe degenerative joint disease of patellofemoral space, may need outpatient orthopedic cortisone injection Restarted colchicine , knee pain is improving  Acute blood loss anemia Status post transfusion of 6 units of packed red blood cells this admission, Follow CBC closely, goal hemoglobin greater than 9.0 in the setting of non-ST elevation MI Will transfuse one more unit today   CKD stage 3 - Appears euvolemic. Cr improving  Chronic combined systolic and diastolic CHF/Acute on chronic diastolic (congestive) heart failure - Appears euvolemic - Cardiology following , needs  echocardiogram done today to reassess EF post MI  Chest pain with coronary artery disease as well as NSTEMI. Now with suspected recurrent non-ST elevation MI ,CAD s/p CABG 1996 with redo 2006 by [name].  likely also has significant underlying coronary artery disease given persistent ST depression in the lateral leads, has anterior wall motion abnormality seen on recent echocardiogram despite having a stable EF of 50-55%. ECG post op   slightly more pronounced ST depression. Developed recurrent anginal symptoms and found to have recurrent non-ST elevation MI postoperatively Managed by cardiology with  metoprolol, nitroglycerin, heparin, . Troponin peak 16 now decreasing c/w recurrent NSTEMI.  weaned off nitroglycerin --Continue isosorbide 60 mg  metoprolol to 100 mg twice a day --Stopped  IV heparin   - treated for non-ST elevation myocardial infarction.  --At this time, no cardiac catheterization. Continue with aggressive medical management. Chest pain-free post MI echo for LVF  trending hemoglobin is lower today than had been before. Avoid adding clopidogrel this  time. Cardiology has signed off  Diabetes mellitus - HgbA1c: 6.0 on arrival - Continue SSI  History of Atrial fibrillation  - No previous anticoagulation due to recurrent GIB - Current NSR  Essential hypertension - Blood pressure well controlled.  - Continue metoprolol   carotid artery disease s/p R CEA by [name] '94   DVT prophylaxis: SCDs,  heparin Code Status: Full Family Communication: Patient's daughter is by the bedside  Disposition Plan: SNF when bed available      Consultants:   Gastroenterology  Cardiology  Surgery  Palliative   Procedures:   Colonoscopy 01/05/2016  Antimicrobials:  None     Subjective:  No chest pain, no SOB. His knee feels much better today.ning of left knee pain. Cant bear weight   Objective: Filed Vitals:   01/19/16 0609 01/19/16 0612 01/19/16 0859 01/19/16 1019  BP: 116/55  128/66 95/60  Pulse: 101  81 73  Temp: 100 F (37.8 C)   99.9 F (37.7 C)  TempSrc: Oral   Oral  Resp: 18   16  Height:      Weight:  83.235 kg (183 lb 8 oz)    SpO2: 97%   100%    Intake/Output Summary (Last 24 hours) at 01/19/16 1108 Last data filed at 01/19/16 1031  Gross per 24 hour  Intake    480 ml  Output    750 ml  Net   -270 ml   Filed Weights   01/17/16 0315 01/18/16 0500 01/19/16 0612  Weight: 83.8 kg (184 lb 11.9 oz) 83.7 kg (184 lb 8.4 oz) 83.235 kg (183 lb 8 oz)   Examination: BP 95/60 mmHg  Pulse 73  Temp(Src) 99.9 F (37.7 C) (Oral)  Resp 16  Ht 5\' 9"  (1.753 m)  Wt 83.235 kg (183 lb 8 oz)  BMI 27.09 kg/m2  SpO2 100%   General: Pleasant, NAD. Currently pain free Neuro: Alert and oriented X 3. Moves all extremities spontaneously. Psych: Normal affect. HEENT: Normal Neck: Supple without bruits or JVD. Lungs: Resp regular and unlabored, CTA. Heart: RRR 1-2/6 sem no s3. Abdomen: Midline incision noted, covered with clear dressing, no obvious active bleeding, some dried blood. No bowel  sound Extremities: No clubbing, cyanosis or edema. DP/PT/Radials 2+ and equal bilaterally.  Data Reviewed: I have personally reviewed following labs and imaging studies  CBC:  Recent Labs Lab 01/16/16 0527 01/17/16 0221 01/17/16 0927 01/18/16 0758 01/19/16 0833  WBC 11.8* 10.1 10.3 9.3 8.3  HGB 9.3* 9.3* 9.7* 9.1* 8.2*  HCT 29.3* 29.3* 30.6* 28.4* 25.8*  MCV 92.1 91.3 91.6 91.3 90.5  PLT 206 230 235 214 0000000   Basic Metabolic Panel:  Recent Labs Lab 01/13/16 0613 01/14/16 0435 01/15/16 0520 01/16/16 1040 01/18/16 0758 01/19/16 0833  NA 141 141 143 140 138 137  K 3.6 3.6 3.8 3.6 3.5 3.4*  CL 102 108 109 105 103 104  CO2 28 24 24 24 25 23   GLUCOSE 161* 262* 152* 237* 230* 172*  BUN 28* 23* 24* 24* 18 15  CREATININE 1.83* 2.05* 1.94* 2.07* 1.55* 1.62*  CALCIUM 8.9 8.4* 8.7* 8.4* 8.5* 8.3*  MG 2.1 1.9  --   --   --   --    GFR: Estimated Creatinine Clearance: 35.8 mL/min (by C-G formula based on Cr of 1.62). Liver Function Tests:  Recent Labs Lab 01/15/16 0520 01/16/16 1040 01/19/16 0833  AST  63* 54* 19  ALT 23 24 18   ALKPHOS 63 71 55  BILITOT 0.7 1.3* 0.8  PROT 5.7* 6.1* 5.0*  ALBUMIN 2.7* 2.6* 2.0*   No results for input(s): LIPASE, AMYLASE in the last 168 hours. No results for input(s): AMMONIA in the last 168 hours. Coagulation Profile:  Recent Labs Lab 01/13/16 0613  INR 1.18   Cardiac Enzymes:  Recent Labs Lab 01/15/16 0520 01/15/16 0950 01/15/16 2332 01/16/16 0527 01/16/16 1040  TROPONINI 8.73* 12.06* 16.00* 13.67* 11.25*   BNP (last 3 results) No results for input(s): PROBNP in the last 8760 hours. HbA1C: No results for input(s): HGBA1C in the last 72 hours. CBG:  Recent Labs Lab 01/18/16 0746 01/18/16 1236 01/18/16 1701 01/18/16 2330 01/19/16 0809  GLUCAP 212* 171* 166* 121* 161*   Lipid Profile: No results for input(s): CHOL, HDL, LDLCALC, TRIG, CHOLHDL, LDLDIRECT in the last 72 hours. Thyroid Function Tests: No  results for input(s): TSH, T4TOTAL, FREET4, T3FREE, THYROIDAB in the last 72 hours. Anemia Panel: No results for input(s): VITAMINB12, FOLATE, FERRITIN, TIBC, IRON, RETICCTPCT in the last 72 hours. Urine analysis:    Component Value Date/Time   COLORURINE YELLOW 11/14/2015 [place] 11/14/2015 0241   LABSPEC 1.018 11/14/2015 0241   PHURINE 5.0 11/14/2015 0241   GLUCOSEU NEGATIVE 11/14/2015 0241   HGBUR NEGATIVE 11/14/2015 0241   HGBUR negative 09/12/2007 1028   BILIRUBINUR NEGATIVE 11/14/2015 0241   KETONESUR NEGATIVE 11/14/2015 0241   PROTEINUR NEGATIVE 11/14/2015 0241   UROBILINOGEN 0.2 06/14/2014 2210   NITRITE NEGATIVE 11/14/2015 0241   LEUKOCYTESUR NEGATIVE 11/14/2015 0241   Sepsis Labs: Invalid input(s): PROCALCITONIN, LACTICIDVEN  Recent Results (from the past 240 hour(s))  Surgical pcr screen     Status: None   Collection Time: 01/13/16  6:19 AM  Result Value Ref Range Status   MRSA, PCR NEGATIVE NEGATIVE Final   Staphylococcus aureus NEGATIVE NEGATIVE Final    Comment:        The Xpert SA Assay (FDA approved for NASAL specimens in patients over 47 years of age), is one component of a comprehensive surveillance program.  Test performance has been validated by [facility] for patients greater than or equal to 80 year old. It is not intended to diagnose infection nor to guide or monitor treatment.       Radiology Studies: Dg Knee Complete 4 Views Left  01/18/2016  CLINICAL DATA:  Left knee pain and swelling for 1 week. EXAM: LEFT KNEE - COMPLETE 4+ VIEW COMPARISON:  None. FINDINGS: No fracture or dislocation is noted. Mild suprapatellar joint effusion is noted. Severe narrowing of patellofemoral space is noted. Mild osteophyte formation is noted medially and laterally. Vascular calcifications are noted. IMPRESSION: Mild suprapatellar joint effusion. Severe degenerative joint disease of patellofemoral space. No fracture or dislocation is noted.  Electronically Signed   By: Marijo Conception, M.D.   On: 01/18/2016 09:29     Scheduled Meds: . sodium chloride   Intravenous Once  . sodium chloride   Intravenous Once  . aspirin EC  81 mg Oral Daily  . colchicine  0.6 mg Oral BID  . docusate sodium  100 mg Oral BID  . heparin  5,000 Units Subcutaneous 3 times per day  . insulin aspart  0-15 Units Subcutaneous TID WC  . insulin aspart  0-5 Units Subcutaneous QHS  . isosorbide mononitrate  60 mg Oral BID WC  . metoprolol tartrate  100 mg Oral BID  . pantoprazole  40 mg Oral BID  . polyethylene glycol  17 g Oral Daily   Continuous Infusions:  None.   Reyne Dumas , MD,  [facility] Pager [phone]  If 7PM-7AM, please contact night-coverage [email or web address] Password [email or web address] 01/19/2016, 11:08 AM

## 2016-01-19 NOTE — Progress Notes (Signed)
Patient has a bed at [facility] when medically stable.  [name] Egor Fullilove LCSWA [phone]

## 2016-01-19 NOTE — Progress Notes (Signed)
6 Days Post-Op  Subjective: Moved to 5 W. Tolerating diet.  No GI complaints.  Passing flatus.  Last bowel movement 48 hours ago. Colchicine restarted yesterday for left knee pain and gout and he feels better. Hopefully he can be ambulated.   Objective: Vital signs in last 24 hours: Temp:  [98.6 F (37 C)-99.5 F (37.5 C)] 99.2 F (37.3 C) (03/07 0209) Pulse Rate:  [58-101] 86 (03/07 0209) Resp:  [16-20] 18 (03/07 0209) BP: (97-117)/(48-64) 117/55 mmHg (03/07 0209) SpO2:  [97 %-100 %] 97 % (03/07 0209) Last BM Date: 01/17/16  Intake/Output from previous day: 03/06 0701 - 03/07 0700 In: -  Out: 750 [Urine:750] Intake/Output this shift: Total I/O In: -  Out: 500 [Urine:500]  General appearance: Alert.  Pleasant and cooperative.  No distress.  Mental status normal. Resp: clear to auscultation bilaterally GI: Soft.  Nondistended.  Incision clean.  Few staples left.  No drainage.  Lab Results:  Results for orders placed or performed during the hospital encounter of 01/03/16 (from the past 24 hour(s))  Glucose, capillary     Status: Abnormal   Collection Time: 01/18/16  7:46 AM  Result Value Ref Range   Glucose-Capillary 212 (H) 65 - 99 mg/dL   Comment 1 Notify RN    Comment 2 Document in Chart   Basic metabolic panel     Status: Abnormal   Collection Time: 01/18/16  7:58 AM  Result Value Ref Range   Sodium 138 135 - 145 mmol/L   Potassium 3.5 3.5 - 5.1 mmol/L   Chloride 103 101 - 111 mmol/L   CO2 25 22 - 32 mmol/L   Glucose, Bld 230 (H) 65 - 99 mg/dL   BUN 18 6 - 20 mg/dL   Creatinine, Ser 1.55 (H) 0.61 - 1.24 mg/dL   Calcium 8.5 (L) 8.9 - 10.3 mg/dL   GFR calc non Af Amer 40 (L) >60 mL/min   GFR calc Af Amer 47 (L) >60 mL/min   Anion gap 10 5 - 15  CBC     Status: Abnormal   Collection Time: 01/18/16  7:58 AM  Result Value Ref Range   WBC 9.3 4.0 - 10.5 K/uL   RBC 3.11 (L) 4.22 - 5.81 MIL/uL   Hemoglobin 9.1 (L) 13.0 - 17.0 g/dL   HCT 28.4 (L) 39.0 - 52.0 %    MCV 91.3 78.0 - 100.0 fL   MCH 29.3 26.0 - 34.0 pg   MCHC 32.0 30.0 - 36.0 g/dL   RDW 15.0 11.5 - 15.5 %   Platelets 214 150 - 400 K/uL  Glucose, capillary     Status: Abnormal   Collection Time: 01/18/16 12:36 PM  Result Value Ref Range   Glucose-Capillary 171 (H) 65 - 99 mg/dL   Comment 1 Notify RN    Comment 2 Document in Chart   Glucose, capillary     Status: Abnormal   Collection Time: 01/18/16  5:01 PM  Result Value Ref Range   Glucose-Capillary 166 (H) 65 - 99 mg/dL   Comment 1 Notify RN    Comment 2 Document in Chart   Glucose, capillary     Status: Abnormal   Collection Time: 01/18/16 11:30 PM  Result Value Ref Range   Glucose-Capillary 121 (H) 65 - 99 mg/dL   Comment 1 Notify RN    Comment 2 Document in Chart      Studies/Results: Dg Knee Complete 4 Views Left  01/18/2016  CLINICAL DATA:  Left knee  pain and swelling for 1 week. EXAM: LEFT KNEE - COMPLETE 4+ VIEW COMPARISON:  None. FINDINGS: No fracture or dislocation is noted. Mild suprapatellar joint effusion is noted. Severe narrowing of patellofemoral space is noted. Mild osteophyte formation is noted medially and laterally. Vascular calcifications are noted. IMPRESSION: Mild suprapatellar joint effusion. Severe degenerative joint disease of patellofemoral space. No fracture or dislocation is noted. Electronically Signed   By: Marijo Conception, M.D.   On: 01/18/2016 09:29    . sodium chloride   Intravenous Once  . aspirin EC  81 mg Oral Daily  . colchicine  0.6 mg Oral BID  . docusate sodium  100 mg Oral BID  . heparin  5,000 Units Subcutaneous 3 times per day  . insulin aspart  0-15 Units Subcutaneous TID WC  . insulin aspart  0-5 Units Subcutaneous QHS  . isosorbide mononitrate  60 mg Oral BID WC  . metoprolol tartrate  100 mg Oral BID  . pantoprazole  40 mg Oral BID  . polyethylene glycol  17 g Oral Daily     Assessment/Plan: s/p Procedure(s): EXPLORATORY LAPAROTOMY, LEFT AND SIGMOID COLON  REMOVAL  Recurrent lower GI bleed POD#6 exploratory laparotomy, splenic flexure mobilization, extended left hemicolectomy and sigmoidectomy---Dr. Rosendo Gros -tolerating diet, DC PCA and PO pain meds -Doing well from GI surgical standpoint. -needs to mobilize -involve PT ABLA-h&h are stable. Last labs 3/6. Left knee pain/effusion=gout-better--obtain XR. Further management per primary team NSTEMI-cards following CAD/CHF/PAF HTN DM-on SSI FEN-no issues. Colace/miralax. Stop entereg.  VTE prophylaxis-SCD/heparin Dispo-stable for floor from a surgical standpoint   @PROBHOSP @  LOS: 16 days    [name] M 01/19/2016  . .prob

## 2016-01-19 NOTE — Progress Notes (Signed)
Mr. Banter is a pleasant 82 year old Caucasian male with past medical history of severe diverticulosis, multiple GI bleed since 1981, carotid artery disease s/p R CEA by [name] '94, HTN, HLD, DM, CKD stage IV, CAD s/p CABG 1996 with redo 2006 by [name], and h/o PAF not on coumadin due to recurrent GI bleed presented with recurrent GI bleed after recent NSTEMI on medical management.  Subjective:  No chest pain, no SOB. His knee feels much better today.  Objective:  Vital Signs in the last 24 hours: Temp:  [98.6 F (37 C)-100 F (37.8 C)] 100 F (37.8 C) (03/07 0609) Pulse Rate:  [58-101] 101 (03/07 0609) Resp:  [18-20] 18 (03/07 0609) BP: (97-117)/(55-64) 116/55 mmHg (03/07 0609) SpO2:  [97 %-100 %] 97 % (03/07 0609) Weight:  [183 lb 8 oz (83.235 kg)] 183 lb 8 oz (83.235 kg) (03/07 0612)  Intake/Output from previous day:  Intake/Output Summary (Last 24 hours) at 01/19/16 [id number] Last data filed at 01/19/16 0600  Gross per 24 hour  Intake    240 ml  Output    750 ml  Net   -510 ml    Physical Exam: General appearance: alert, cooperative and no distress Lungs: clear to auscultation bilaterally Heart: irregular rate and rhythm and soft systolic murmur LSB Neurologic: Grossly normal Abdomen: Appropriately tender but otherwise soft with normoactive bowel sounds Extremities: No C/C/E. Left knee swelling is reduced and less warm   Rate: 88  Rhythm: atrial fibrillation and RBBB  Lab Results:  Recent Labs  01/17/16 0927 01/18/16 0758  WBC 10.3 9.3  HGB 9.7* 9.1*  PLT 235 214    Recent Labs  01/16/16 1040 01/18/16 0758  NA 140 138  K 3.6 3.5  CL 105 103  CO2 24 25  GLUCOSE 237* 230*  BUN 24* 18  CREATININE 2.07* 1.55*    Recent Labs  01/16/16 1040  TROPONINI 11.25*   No results for input(s): INR in the last 72 hours.  Scheduled Meds: . sodium chloride   Intravenous Once  . aspirin EC  81 mg Oral Daily  . colchicine  0.6 mg Oral BID  .  docusate sodium  100 mg Oral BID  . heparin  5,000 Units Subcutaneous 3 times per day  . insulin aspart  0-15 Units Subcutaneous TID WC  . insulin aspart  0-5 Units Subcutaneous QHS  . isosorbide mononitrate  60 mg Oral BID WC  . metoprolol tartrate  100 mg Oral BID  . pantoprazole  40 mg Oral BID  . polyethylene glycol  17 g Oral Daily   Continuous Infusions:  PRN Meds:.diphenhydrAMINE **OR** diphenhydrAMINE, HYDROmorphone (DILAUDID) injection, naloxone **AND** sodium chloride flush, ondansetron **OR** ondansetron (ZOFRAN) IV, oxyCODONE-acetaminophen   Imaging: Imaging results have been reviewed  Cardiac Studies: Echo 11/17/15 Study Conclusions  - Left ventricle: There is hypokinesis of the mid anterolateral , anterior and distal lateral and anterior walls. The cavity size was normal. Systolic function was normal. The estimated ejection fraction was in the range of 50% to 55%. Wall motion was normal; there were no regional wall motion abnormalities. Features are consistent with a pseudonormal left ventricular filling pattern, with concomitant abnormal relaxation and increased filling pressure (grade 2 diastolic dysfunction). Doppler parameters are consistent with elevated ventricular end-diastolic filling pressure. - Aortic root: The aortic root was normal in size. - Mitral valve: There was mild to moderate regurgitation. - Left atrium: The atrium was moderately dilated. - Right ventricle: Systolic function was  normal. - Tricuspid valve: Structurally normal valve. There was moderate regurgitation. - Pulmonic valve: There was no regurgitation. - Pulmonary arteries: Systolic pressure was moderately increased. PA peak pressure: 51 mm Hg (S). - Inferior vena cava: The vessel was normal in size. - Pericardium, extracardiac: There was no pericardial effusion.    Assessment/Plan:  82 year old male with past medical history of severe diverticulosis, multiple  GI bleeds since 1981, carotid artery disease s/p R CEA by [name] '94, HTN, HLD, DM, CKD stage IV, CAD- s/p CABG 1996 with redo 2006 by [name], and h/o PAF- not on coumadin due to recurrent GI bleeds. He presented 01/03/16 with recurrent GI bleed. He underwent Lt hemicolectomy [id number] complicated by by peri-op NSTEMI (Troponin peak-16 on 01/16/16). Plan is for medical Rx.   Principal Problem:   Acute blood loss anemia Active Problems:   NSTEMI (non-ST elevated myocardial infarction) ([name])   History of GI diverticular bleed   Type 2 diabetes mellitus with peripheral neuropathy ([name])   CKD (chronic kidney disease) stage 3, GFR 30-59 ml/min   Hx of CABG   Acute-on-chronic kidney injury ([place])   Atrial fibrillation ([name])   Diastolic dysfunction-grade 2 with EF 50-55%   Effusion of left knee joint   Acute gout   DNR (do not resuscitate) discussion   Pressure ulcer   Acute GI bleeding   PLAN:  Continue medical Rx for CAD, peri op MI. Post MI echo pending.   Kerin Ransom PA-C 01/19/2016, 8:12 AM [phone]  I have seen, examined and evaluated the patient this AM along with Mr. Rosalyn Gess, [place].  After reviewing all the available data and chart,  I agree with his findings, examination as well as impression recommendations.  Mr. Huffine looks good today. He says that his pain and his pain in the left knee  is much better, still has a little bit of difficulty maintaining weightbearing on that knee.  He otherwise has no cardiac symptoms whatsoever. No sensation of being in atrial fibrillation. No anginal or heart failure symptoms.  Should have an echocardiogram done today to reassess EF post MI. Remains on stable dose of beta blocker and Imdur along with aspirin. He was on Crestor as an outpatient. Would restart.  Blood pressure well controlled. Heart rate well controlled on current dose of beta blocker. - Not on anticoagulation for obvious reasons based on his prior GI bleeding history  hemoglobin is lower today than had been before. I would be reluctant to even consider adding clopidogrel this time.  No active cardiac issues at this time. On stable regimen. Cardiology will sign off unless there are any active questions or concerns prior to discharge. We will help arrange with follow-up on discharge.   Leonie Man, M.D., M.S. Interventional Cardiologist   Pager # [phone] Phone # [phone] [address] [place],  [id number]

## 2016-01-20 DIAGNOSIS — R279 Unspecified lack of coordination: Secondary | ICD-10-CM | POA: Diagnosis not present

## 2016-01-20 DIAGNOSIS — K5900 Constipation, unspecified: Secondary | ICD-10-CM | POA: Diagnosis not present

## 2016-01-20 DIAGNOSIS — E569 Vitamin deficiency, unspecified: Secondary | ICD-10-CM | POA: Diagnosis not present

## 2016-01-20 DIAGNOSIS — D649 Anemia, unspecified: Secondary | ICD-10-CM | POA: Diagnosis not present

## 2016-01-20 DIAGNOSIS — K625 Hemorrhage of anus and rectum: Secondary | ICD-10-CM | POA: Diagnosis not present

## 2016-01-20 DIAGNOSIS — R600 Localized edema: Secondary | ICD-10-CM | POA: Diagnosis not present

## 2016-01-20 DIAGNOSIS — I4891 Unspecified atrial fibrillation: Secondary | ICD-10-CM | POA: Diagnosis not present

## 2016-01-20 DIAGNOSIS — R262 Difficulty in walking, not elsewhere classified: Secondary | ICD-10-CM | POA: Diagnosis not present

## 2016-01-20 DIAGNOSIS — E559 Vitamin D deficiency, unspecified: Secondary | ICD-10-CM | POA: Diagnosis not present

## 2016-01-20 DIAGNOSIS — L89152 Pressure ulcer of sacral region, stage 2: Secondary | ICD-10-CM | POA: Diagnosis not present

## 2016-01-20 DIAGNOSIS — K922 Gastrointestinal hemorrhage, unspecified: Secondary | ICD-10-CM | POA: Diagnosis not present

## 2016-01-20 DIAGNOSIS — N183 Chronic kidney disease, stage 3 (moderate): Secondary | ICD-10-CM | POA: Diagnosis not present

## 2016-01-20 DIAGNOSIS — I208 Other forms of angina pectoris: Secondary | ICD-10-CM | POA: Diagnosis not present

## 2016-01-20 DIAGNOSIS — M6281 Muscle weakness (generalized): Secondary | ICD-10-CM | POA: Diagnosis not present

## 2016-01-20 DIAGNOSIS — K219 Gastro-esophageal reflux disease without esophagitis: Secondary | ICD-10-CM | POA: Diagnosis not present

## 2016-01-20 DIAGNOSIS — Z48815 Encounter for surgical aftercare following surgery on the digestive system: Secondary | ICD-10-CM | POA: Diagnosis not present

## 2016-01-20 DIAGNOSIS — I2510 Atherosclerotic heart disease of native coronary artery without angina pectoris: Secondary | ICD-10-CM | POA: Diagnosis not present

## 2016-01-20 DIAGNOSIS — R52 Pain, unspecified: Secondary | ICD-10-CM | POA: Diagnosis not present

## 2016-01-20 DIAGNOSIS — Z8719 Personal history of other diseases of the digestive system: Secondary | ICD-10-CM | POA: Diagnosis not present

## 2016-01-20 DIAGNOSIS — M25469 Effusion, unspecified knee: Secondary | ICD-10-CM | POA: Diagnosis not present

## 2016-01-20 DIAGNOSIS — E119 Type 2 diabetes mellitus without complications: Secondary | ICD-10-CM | POA: Diagnosis not present

## 2016-01-20 DIAGNOSIS — S31155D Open bite of abdominal wall, periumbilic region without penetration into peritoneal cavity, subsequent encounter: Secondary | ICD-10-CM | POA: Diagnosis not present

## 2016-01-20 DIAGNOSIS — I10 Essential (primary) hypertension: Secondary | ICD-10-CM | POA: Diagnosis not present

## 2016-01-20 DIAGNOSIS — E785 Hyperlipidemia, unspecified: Secondary | ICD-10-CM | POA: Diagnosis not present

## 2016-01-20 DIAGNOSIS — I6789 Other cerebrovascular disease: Secondary | ICD-10-CM | POA: Diagnosis not present

## 2016-01-20 DIAGNOSIS — I509 Heart failure, unspecified: Secondary | ICD-10-CM | POA: Diagnosis not present

## 2016-01-20 DIAGNOSIS — M1000 Idiopathic gout, unspecified site: Secondary | ICD-10-CM

## 2016-01-20 DIAGNOSIS — N179 Acute kidney failure, unspecified: Secondary | ICD-10-CM | POA: Diagnosis not present

## 2016-01-20 DIAGNOSIS — D62 Acute posthemorrhagic anemia: Secondary | ICD-10-CM | POA: Diagnosis not present

## 2016-01-20 DIAGNOSIS — M109 Gout, unspecified: Secondary | ICD-10-CM | POA: Diagnosis not present

## 2016-01-20 LAB — TYPE AND SCREEN
ABO/RH(D): O NEG
ANTIBODY SCREEN: POSITIVE
DAT, IgG: NEGATIVE
DONOR AG TYPE: NEGATIVE
UNIT DIVISION: 0

## 2016-01-20 LAB — COMPREHENSIVE METABOLIC PANEL
ALBUMIN: 1.9 g/dL — AB (ref 3.5–5.0)
ALT: 19 U/L (ref 17–63)
AST: 17 U/L (ref 15–41)
Alkaline Phosphatase: 57 U/L (ref 38–126)
Anion gap: 12 (ref 5–15)
BILIRUBIN TOTAL: 0.8 mg/dL (ref 0.3–1.2)
BUN: 14 mg/dL (ref 6–20)
CHLORIDE: 103 mmol/L (ref 101–111)
CO2: 24 mmol/L (ref 22–32)
Calcium: 8.7 mg/dL — ABNORMAL LOW (ref 8.9–10.3)
Creatinine, Ser: 1.64 mg/dL — ABNORMAL HIGH (ref 0.61–1.24)
GFR calc Af Amer: 44 mL/min — ABNORMAL LOW (ref >60)
GFR calc non Af Amer: 38 mL/min — ABNORMAL LOW (ref >60)
GLUCOSE: 159 mg/dL — AB (ref 65–99)
POTASSIUM: 3.5 mmol/L (ref 3.5–5.1)
Sodium: 139 mmol/L (ref 135–145)
TOTAL PROTEIN: 5 g/dL — AB (ref 6.5–8.1)

## 2016-01-20 LAB — CBC
HCT: 29 % — ABNORMAL LOW (ref 39.0–52.0)
HEMOGLOBIN: 9.5 g/dL — AB (ref 13.0–17.0)
MCH: 29.8 pg (ref 26.0–34.0)
MCHC: 32.8 g/dL (ref 30.0–36.0)
MCV: 90.9 fL (ref 78.0–100.0)
Platelets: 186 10*3/uL (ref 150–400)
RBC: 3.19 MIL/uL — ABNORMAL LOW (ref 4.22–5.81)
RDW: 15.2 % (ref 11.5–15.5)
WBC: 9.6 10*3/uL (ref 4.0–10.5)

## 2016-01-20 LAB — GLUCOSE, CAPILLARY
Glucose-Capillary: 151 mg/dL — ABNORMAL HIGH (ref 65–99)
Glucose-Capillary: 172 mg/dL — ABNORMAL HIGH (ref 65–99)

## 2016-01-20 MED ORDER — FUROSEMIDE 80 MG PO TABS: 40.0000 mg | ORAL_TABLET | Freq: Every day | ORAL | Status: DC

## 2016-01-20 MED ORDER — DOCUSATE SODIUM 100 MG PO CAPS: 100.0000 mg | ORAL_CAPSULE | Freq: Two times a day (BID) | ORAL | Status: DC

## 2016-01-20 MED ORDER — POLYETHYLENE GLYCOL 3350 17 G PO PACK: 17.0000 g | PACK | Freq: Every day | ORAL | Status: DC

## 2016-01-20 MED ORDER — PANTOPRAZOLE SODIUM 40 MG PO TBEC: 40.0000 mg | DELAYED_RELEASE_TABLET | Freq: Two times a day (BID) | ORAL | Status: DC

## 2016-01-20 MED ORDER — OXYCODONE HCL 5 MG PO TABS: 5.0000 mg | ORAL_TABLET | ORAL | Status: DC | PRN

## 2016-01-20 MED ORDER — METOPROLOL TARTRATE 100 MG PO TABS: 100.0000 mg | ORAL_TABLET | Freq: Two times a day (BID) | ORAL | Status: DC

## 2016-01-20 MED ORDER — ISOSORBIDE MONONITRATE ER 60 MG PO TB24: 60.0000 mg | ORAL_TABLET | Freq: Two times a day (BID) | ORAL | Status: DC

## 2016-01-20 NOTE — Progress Notes (Signed)
Report given to [facility], RN.  Will continue to monitor.

## 2016-01-20 NOTE — Discharge Summary (Signed)
Physician Discharge Summary  [name] MRN: [id number] DOB/AGE: 80-14-1933 80 y.o.  PCP: [name], MD   Admit date: 01/03/2016 Discharge date: 01/20/2016  Discharge Diagnoses:   Principal Problem:   Acute blood loss anemia Active Problems:   Type 2 diabetes mellitus with peripheral neuropathy ([name])   CKD (chronic kidney disease) stage 3, GFR 30-59 ml/min   Hx of CABG   Acute-on-chronic kidney injury ([place])   NSTEMI (non-ST elevated myocardial infarction) ([place])   Atrial fibrillation ([name])   History of GI diverticular bleed   DNR (do not resuscitate) discussion   Pressure ulcer   Diastolic dysfunction-grade 2 with EF 50-55%   Effusion of left knee joint   Acute GI bleeding   Acute gout    Follow-up recommendations Follow-up with PCP in 3-5 days , including all  additional recommended appointments as below Follow-up CBC, CMP in 3-5 days  patient to follow-up with cardiology as scheduled       Discharge Condition: Stable   Discharge Instructions       Discharge Instructions    Diet - low sodium heart healthy    Complete by:  As directed      Increase activity slowly    Complete by:  As directed           Current Discharge Medication List    START taking these medications   Details  docusate sodium (COLACE) 100 MG capsule Take 1 capsule (100 mg total) by mouth 2 (two) times daily. Qty: 60 capsule, Refills: 0    metoprolol (LOPRESSOR) 100 MG tablet Take 1 tablet (100 mg total) by mouth 2 (two) times daily. Qty: 60 tablet, Refills: 1    oxyCODONE (ROXICODONE) 5 MG immediate release tablet Take 1 tablet (5 mg total) by mouth every 4 (four) hours as needed for severe pain. Qty: 30 tablet, Refills: 0    pantoprazole (PROTONIX) 40 MG tablet Take 1 tablet (40 mg total) by mouth 2 (two) times daily. Qty: 60 tablet, Refills: 1    polyethylene glycol (MIRALAX / GLYCOLAX) packet Take 17 g by mouth daily. Qty: 14 each, Refills: 0      CONTINUE these  medications which have CHANGED   Details  furosemide (LASIX) 80 MG tablet Take 0.5 tablets (40 mg total) by mouth daily. Qty: 30 tablet, Refills: 0    isosorbide mononitrate (IMDUR) 60 MG 24 hr tablet Take 1 tablet (60 mg total) by mouth 2 (two) times daily with a meal. Qty: 120 tablet, Refills: 2      CONTINUE these medications which have NOT CHANGED   Details  aspirin 81 MG tablet Take 81 mg by mouth daily.    BD INSULIN SYRINGE ULTRAFINE 31G X 5/16" 0.3 ML MISC USE DAILY AS INSTRUCTED Qty: 100 each, Refills: 2    Cholecalciferol (VITAMIN D) 1000 UNITS capsule Take 1,000 Units by mouth daily.     colchicine 0.6 MG tablet Take 1 tablet (0.6 mg total) by mouth daily as needed (gout flare up).    fish oil-omega-3 fatty acids 1000 MG capsule Take 1 g by mouth daily.     insulin glargine (LANTUS) 100 UNIT/ML injection Inject 0.15 mLs (15 Units total) into the skin at bedtime. Qty: 10 mL    magnesium oxide (MAG-OX) 400 MG tablet Take 400 mg by mouth daily.     Multiple Vitamin (MULTIVITAMIN WITH MINERALS) TABS Take 1 tablet by mouth daily.    nitroGLYCERIN (NITROSTAT) 0.4 MG SL tablet Place 1 tablet (  0.4 mg total) under the tongue every 5 (five) minutes as needed for chest pain. Qty: 100 tablet, Refills: 1    Potassium Gluconate (K-99) 595 MG CAPS Take 595 mg by mouth daily.     ranolazine (RANEXA) 500 MG 12 hr tablet Take 1 tablet (500 mg total) by mouth 2 (two) times daily. Qty: 90 tablet, Refills: 3   Associated Diagnoses: Bilateral carotid artery disease ([name])    rosuvastatin (CRESTOR) 20 MG tablet Take 1 tablet (20 mg total) by mouth daily. Qty: 90 tablet, Refills: 1      STOP taking these medications     TOPROL XL 50 MG 24 hr tablet      traMADol (ULTRAM) 50 MG tablet        No Known Allergies    Disposition: 01-Home or Self Care   Consults:  Cardiology     Significant Diagnostic Studies:  Dg Chest 2 View  01/03/2016  CLINICAL DATA:  82 year old  male with chest pain EXAM: CHEST  2 VIEW COMPARISON:  Radiograph dated 12/18/2015 FINDINGS: Two views of the chest demonstrate stable minimal left lung base linear atelectasis. There is no focal consolidation, pleural effusion, or pneumothorax. Stable cardiac silhouette. Median sternotomy wires and CABG vascular clips. No acute osseous pathology. IMPRESSION: No active cardiopulmonary disease. Electronically Signed   By: Anner Crete M.D.   On: 01/03/2016 03:02   Nm Gi Blood Loss  01/10/2016  CLINICAL DATA:  GI hemorrhage and rectal bleeding EXAM: NUCLEAR MEDICINE GASTROINTESTINAL BLEEDING SCAN TECHNIQUE: Sequential abdominal images were obtained following intravenous administration of Tc-3mlabeled red blood cells. RADIOPHARMACEUTICALS:  25.1 mCi Tc-943mn-vitro labeled red cells. COMPARISON:  01/03/2016 FINDINGS: There are now areas of increased activity identified in the left mid abdomen which demonstrate mobility and likely lie within the distal transverse colon and proximal descending colon consistent with acute hemorrhage. No other focal area of hemorrhage is seen. IMPRESSION: Areas of increased activity in the far lateral left abdomen likely related to the distal transverse and proximal descending colon. Electronically Signed   By: MaInez Catalina.D.   On: 01/10/2016 16:30   Nm Gi Blood Loss  01/03/2016  CLINICAL DATA:  History of GI diverticular bleed. GI bleeding. Dark blood in the stools noted 4 days ago. EXAM: NUCLEAR MEDICINE GASTROINTESTINAL BLEEDING SCAN TECHNIQUE: Sequential abdominal images were obtained following intravenous administration of Tc-9924mbeled red blood cells. RADIOPHARMACEUTICALS:  25.0 mCi Tc-85m35mvitro labeled red cells. COMPARISON:  09/13/2008 FINDINGS: Early level activity is identified. Delayed images are performed, demonstrating no site of active bleeding. IMPRESSION: A site of active bleeding is not identified. Electronically Signed   By: ElizNolon Nations.   On:  01/03/2016 16:27   Dg Knee Complete 4 Views Left  01/18/2016  CLINICAL DATA:  Left knee pain and swelling for 1 week. EXAM: LEFT KNEE - COMPLETE 4+ VIEW COMPARISON:  None. FINDINGS: No fracture or dislocation is noted. Mild suprapatellar joint effusion is noted. Severe narrowing of patellofemoral space is noted. Mild osteophyte formation is noted medially and laterally. Vascular calcifications are noted. IMPRESSION: Mild suprapatellar joint effusion. Severe degenerative joint disease of patellofemoral space. No fracture or dislocation is noted. Electronically Signed   By: JameMarijo ConceptionD.   On: 01/18/2016 09:29       Filed Weights   01/18/16 0500 01/19/16 0612 01/20/16 0500  Weight: 83.7 kg (184 lb 8.4 oz) 83.235 kg (183 lb 8 oz) 84.2 kg (185 lb 10 oz)  Microbiology: Recent Results (from the past 240 hour(s))  Surgical pcr screen     Status: None   Collection Time: 01/13/16  6:19 AM  Result Value Ref Range Status   MRSA, PCR NEGATIVE NEGATIVE Final   Staphylococcus aureus NEGATIVE NEGATIVE Final    Comment:        The Xpert SA Assay (FDA approved for NASAL specimens in patients over 6 years of age), is one component of a comprehensive surveillance program.  Test performance has been validated by [facility] for patients greater than or equal to 82 year old. It is not intended to diagnose infection nor to guide or monitor treatment.        Blood Culture    Component Value Date/Time   SDES BLOOD LEFT HAND 06/14/2014 2225   SPECREQUEST BOTTLES DRAWN AEROBIC ONLY 5CC 06/14/2014 2225   CULT  06/14/2014 2225    NO GROWTH 5 DAYS Performed at [facility] 06/23/2014 FINAL 06/14/2014 2225      Labs: Results for orders placed or performed during the hospital encounter of 01/03/16 (from the past 48 hour(s))  Glucose, capillary     Status: Abnormal   Collection Time: 01/18/16 12:36 PM  Result Value Ref Range   Glucose-Capillary 171 (H) 65 - 99  mg/dL   Comment 1 Notify RN    Comment 2 Document in Chart   Glucose, capillary     Status: Abnormal   Collection Time: 01/18/16  5:01 PM  Result Value Ref Range   Glucose-Capillary 166 (H) 65 - 99 mg/dL   Comment 1 Notify RN    Comment 2 Document in Chart   Glucose, capillary     Status: Abnormal   Collection Time: 01/18/16 11:30 PM  Result Value Ref Range   Glucose-Capillary 121 (H) 65 - 99 mg/dL   Comment 1 Notify RN    Comment 2 Document in Chart   Glucose, capillary     Status: Abnormal   Collection Time: 01/19/16  8:09 AM  Result Value Ref Range   Glucose-Capillary 161 (H) 65 - 99 mg/dL  CBC     Status: Abnormal   Collection Time: 01/19/16  8:33 AM  Result Value Ref Range   WBC 8.3 4.0 - 10.5 K/uL   RBC 2.85 (L) 4.22 - 5.81 MIL/uL   Hemoglobin 8.2 (L) 13.0 - 17.0 g/dL   HCT 25.8 (L) 39.0 - 52.0 %   MCV 90.5 78.0 - 100.0 fL   MCH 28.8 26.0 - 34.0 pg   MCHC 31.8 30.0 - 36.0 g/dL   RDW 15.2 11.5 - 15.5 %   Platelets 195 150 - 400 K/uL  Comprehensive metabolic panel     Status: Abnormal   Collection Time: 01/19/16  8:33 AM  Result Value Ref Range   Sodium 137 135 - 145 mmol/L   Potassium 3.4 (L) 3.5 - 5.1 mmol/L   Chloride 104 101 - 111 mmol/L   CO2 23 22 - 32 mmol/L   Glucose, Bld 172 (H) 65 - 99 mg/dL   BUN 15 6 - 20 mg/dL   Creatinine, Ser 1.62 (H) 0.61 - 1.24 mg/dL   Calcium 8.3 (L) 8.9 - 10.3 mg/dL   Total Protein 5.0 (L) 6.5 - 8.1 g/dL   Albumin 2.0 (L) 3.5 - 5.0 g/dL   AST 19 15 - 41 U/L   ALT 18 17 - 63 U/L   Alkaline Phosphatase 55 38 - 126 U/L   Total Bilirubin 0.8  0.3 - 1.2 mg/dL   GFR calc non Af Amer 38 (L) >60 mL/min   GFR calc Af Amer 44 (L) >60 mL/min    Comment: (NOTE) The eGFR has been calculated using the CKD EPI equation. This calculation has not been validated in all clinical situations. eGFR's persistently <60 mL/min signify possible Chronic Kidney Disease.    Anion gap 10 5 - 15  Glucose, capillary     Status: Abnormal   Collection  Time: 01/19/16 11:56 AM  Result Value Ref Range   Glucose-Capillary 167 (H) 65 - 99 mg/dL  Prepare RBC     Status: None   Collection Time: 01/19/16  1:14 PM  Result Value Ref Range   Order Confirmation ORDER PROCESSED BY BLOOD BANK   Type and screen [place]     Status: None (Preliminary result)   Collection Time: 01/19/16  1:14 PM  Result Value Ref Range   ABO/RH(D) O NEG    Antibody Screen POS    Sample Expiration 01/22/2016    DAT, IgG NEG    Antibody Identification ANTI K    Unit Number [id number]    Blood Component Type RED CELLS,LR    Unit division 00    Status of Unit ISSUED    Transfusion Status OK TO TRANSFUSE    Crossmatch Result COMPATIBLE    Donor AG Type NEGATIVE FOR KELL ANTIGEN   Glucose, capillary     Status: Abnormal   Collection Time: 01/19/16  5:24 PM  Result Value Ref Range   Glucose-Capillary 161 (H) 65 - 99 mg/dL  Glucose, capillary     Status: Abnormal   Collection Time: 01/19/16 11:02 PM  Result Value Ref Range   Glucose-Capillary 144 (H) 65 - 99 mg/dL  CBC     Status: Abnormal   Collection Time: 01/20/16  5:00 AM  Result Value Ref Range   WBC 9.6 4.0 - 10.5 K/uL   RBC 3.19 (L) 4.22 - 5.81 MIL/uL   Hemoglobin 9.5 (L) 13.0 - 17.0 g/dL   HCT 29.0 (L) 39.0 - 52.0 %   MCV 90.9 78.0 - 100.0 fL   MCH 29.8 26.0 - 34.0 pg   MCHC 32.8 30.0 - 36.0 g/dL   RDW 15.2 11.5 - 15.5 %   Platelets 186 150 - 400 K/uL  Comprehensive metabolic panel     Status: Abnormal   Collection Time: 01/20/16  5:00 AM  Result Value Ref Range   Sodium 139 135 - 145 mmol/L   Potassium 3.5 3.5 - 5.1 mmol/L   Chloride 103 101 - 111 mmol/L   CO2 24 22 - 32 mmol/L   Glucose, Bld 159 (H) 65 - 99 mg/dL   BUN 14 6 - 20 mg/dL   Creatinine, Ser 1.64 (H) 0.61 - 1.24 mg/dL   Calcium 8.7 (L) 8.9 - 10.3 mg/dL   Total Protein 5.0 (L) 6.5 - 8.1 g/dL   Albumin 1.9 (L) 3.5 - 5.0 g/dL   AST 17 15 - 41 U/L   ALT 19 17 - 63 U/L   Alkaline Phosphatase 57 38 - 126 U/L    Total Bilirubin 0.8 0.3 - 1.2 mg/dL   GFR calc non Af Amer 38 (L) >60 mL/min   GFR calc Af Amer 44 (L) >60 mL/min    Comment: (NOTE) The eGFR has been calculated using the CKD EPI equation. This calculation has not been validated in all clinical situations. eGFR's persistently <60 mL/min signify possible Chronic Kidney Disease.  Anion gap 12 5 - 15  Glucose, capillary     Status: Abnormal   Collection Time: 01/20/16  7:55 AM  Result Value Ref Range   Glucose-Capillary 151 (H) 65 - 99 mg/dL     Lipid Panel     Component Value Date/Time   CHOL 128 12/19/2015 0043   TRIG 95 12/19/2015 0043   HDL 39* 12/19/2015 0043   CHOLHDL 3.3 12/19/2015 0043   VLDL 19 12/19/2015 0043   LDLCALC 70 12/19/2015 0043     Lab Results  Component Value Date   HGBA1C 6.0* 01/03/2016   HGBA1C 6.0* 12/31/2015   HGBA1C 6.9 09/24/2015     Lab Results  Component Value Date   MICROALBUR 2.7* 12/26/2014   LDLCALC 70 12/19/2015   CREATININE 1.64* 01/20/2016     HPI :Mr. Reister is a pleasant 82 year old Caucasian male with past medical history of severe diverticulosis, multiple GI bleed since 1981, carotid artery disease s/p R CEA by [name] '94, HTN, HLD, DM, CKD stage IV, CAD s/p CABG 1996 with redo 2006 by [name], and h/o PAF not on coumadin due to recurrent GI bleed. He was hospitalized in late July 2015 for unstable angina. Cardiac catheterization revealed high-grade lesion in SVG to OM which was stented in a staged fashion on 7/31 with Promus Premier 4.0x50m DES. Postprocedure, he had prolonged chest pain for 12 hours. P2Y12 study was suboptimal at 266. He was brought back to the cath lab on 06/14/2014 which revealed a segmental 50-60% stenosis proximal to the previously placed stent which was treated with 4.0 x 60 mm Promus Premier DES. He was felt to be a Plavix nonresponder, Plavix was changed to Brilinta. According to the patient, since 1981, he has had at least 5 admissions for GI  bleed. He states he usually have GI bleed at least twice a year, and most of the time, he does not need to come to the hospital and would self resolve within 2 days. He was previously taken off of Coumadin due to concern for GI bleed, however continued to have episodic blood in the stool despite this.   He was recently admitted from 11/13/2015 until 11/21/2015 with recurrent GI bleed requiring transfusion. Hospital course was complicated by recurrence of NSTEMI and pneumonia. Due to stage IV CKD, and active bleeding, cardiology was  unable to cath him. However EKG at the time shows new ST depression in the lateral leads. Elevated troponin. And also echocardiogram showed new wall motion abnormality in the anterior myocardium. Due to active bleeding, his Brilinta was stopped, he was continued on aspirin. Ranexa was added for antianginal effect, amlodipine was discontinued. He had GI workup with clipping. Since discharge, he continued to have mild chest discomfort and shortness of breath with minimal exertion.  Unfortunately despite previous GI clipping, he started note dark stools on Tuesday 12/29/2015, after 4 days of GI bleed, [facility] He has been evaluated by both gastroenterology and surgery team. He underwent diagnostic colonoscopy on 01/05/2016 which showed pancolonic diverticulosis worse on the left colon. He was felt to be bleeding from the left colon given his history of recurrent GI bleed as well and previous GI workup. RBC tracer image showed area of increased activity in the left lateral abdomen likely represent distal transverse and proximal descending colon acute hemorrhage. Cardiology has been consulted for preoperative clearance. Of note, current EKG still shows ST depression in the lateral  leads.  HOSPITAL COURSE:   Acute GI bleed/Acute blood loss anemia - Recurrent diverticular bleed despite cessation of Brilinta 5-6  weeks ago. - Colonoscopy 01/05/2016 revealed diverticulosis: severe in the left colon, moderate in the transverse colon and mild in the right colon. No evidence of active bleeding or old blood. - Nuclear RBC scan 01/10/2016 revealed increased activity in the left mid-abd, likely the distal transverse colon and proximal descending colon consistent with acute hemorrhage. - Cardiology cleared patient for surgery without further testing; patient is a high-risk surgical candidate - Suspected LGIB from diverticuli;open left hemicolectomy and sigmoidectomy 3/1 - received a total of 7 units of packed red blood cells this admission tolerating diet, DC PCA and PO pain meds -Doing well from GI surgical standpoint. Hemoglobin change from 9.2> 7.7, > 9.3> 9.7>9.1>8.2>9.5 No recurrent bleeding This Hospital course complicated by non-ST elevation MI postoperatively   Left knee pain Mild suprapatellar joint effusion. Severe degenerative joint disease of patellofemoral space, may need outpatient orthopedic cortisone injection Restarted colchicine , knee pain is improving  Acute blood loss anemia Status post transfusion of 6 units of packed red blood cells this admission, Follow CBC closely, goal hemoglobin greater than 9.0 in the setting of non-ST elevation MI Will transfuse one more unit today   CKD stage 3 - Appears euvolemic. Cr improving  Chronic combined systolic and diastolic CHF/Acute on chronic diastolic (congestive) heart failure - Appears euvolemic - Cardiology following , needs echocardiogram done today to reassess EF post MI  Chest pain with coronary artery disease as well as NSTEMI. Now with suspected recurrent non-ST elevation MI ,CAD s/p CABG 1996 with redo 2006 by [name]. likely also has significant underlying coronary artery disease given persistent ST depression in the lateral leads, has anterior wall motion abnormality seen on recent echocardiogram despite having a stable EF  of 50-55%. ECG post op slightly more pronounced ST depression. Developed recurrent anginal symptoms and found to have recurrent non-ST elevation MI postoperatively Managed by cardiology with metoprolol, nitroglycerin, heparin, . Troponin peak 16 now decreasing c/w recurrent NSTEMI. weaned off nitroglycerin --Continue isosorbide 60 mg metoprolol to 100 mg twice a day --Stopped IV heparin - treated for non-ST elevation myocardial infarction.  --At this time, no cardiac catheterization. Continue with aggressive medical management. Chest pain-free post MI echo for LVF  , LV EF: 55% - 60% hemoglobin is lower today than had been before. Avoid adding clopidogrel this time. Cardiology has signed off and arranged for a follow up   Diabetes mellitus - HgbA1c: 6.0 on arrival - Continue levemir    History of Atrial fibrillation  - No previous anticoagulation due to recurrent GIB - Current NSR  Essential hypertension - Blood pressure well controlled.  - Continue metoprolol   carotid artery disease s/p R CEA by [name] '94    Discharge Exam:    Blood pressure 114/77, pulse 100, temperature 98.6 F (37 C), temperature source Oral, resp. rate 19, height '5\' 9"'$  (1.753 m), weight 84.2 kg (185 lb 10 oz), SpO2 98 %.  General: Pleasant, NAD. Currently pain free Neuro: Alert and oriented X 3. Moves all extremities spontaneously. Psych: Normal affect. HEENT: Normal Neck: Supple without bruits or JVD. Lungs: Resp regular and unlabored, CTA. Heart: RRR 1-2/6 sem no s3. Abdomen: Midline incision noted, covered with clear dressing, no obvious active bleeding, some dried blood. No bowel sound Extremities: No clubbing, cyanosis or edema. DP/PT/Radials 2+ and equal bilaterally    Follow-up Information  Follow up with Quay Burow, MD.   Specialties:  Cardiology, Radiology   Why:  office will call you to see Dr Gwenlyn Found or a PA in two weeks   Contact information:    [address] [place] [address] [phone]       Follow up with [name], MD. Schedule an appointment as soon as possible for a visit in 3 days.   Specialty:  Family Medicine   Contact information:   [place] [address] [phone]       Signed: Reyne Dumas 01/20/2016, 10:42 AM        Time spent >45 mins

## 2016-01-20 NOTE — Progress Notes (Signed)
Patient ID: [name], male   DOB: [birth year], 82 y.o.   MRN: [id number]     [address] [place], [place] [address]    Phone: [phone] FAX: [phone]     Subjective: Getting up to [facility].  Left knee pain is better, but still difficult to bear weight. Afebrile. Vss.   Having BMs.  Tolerating POs.  Appetite is fair.  Objective:  Vital signs:  Filed Vitals:   01/19/16 2305 01/20/16 0208 01/20/16 0500 01/20/16 0506  BP: 119/55 107/52  122/64  Pulse: 87 78  88  Temp: 99.5 F (37.5 C) 98.6 F (37 C)  99.2 F (37.3 C)  TempSrc: Oral Oral  Oral  Resp: '16 17  15  '$ Height:      Weight:   84.2 kg (185 lb 10 oz)   SpO2: 98% 98%  99%    Last BM Date: 01/19/16  Intake/Output   Yesterday:  03/07 0701 - 03/08 0700 In: 1432.2 [P.O.:820; I.V.:244.2; Blood:368] Out: -  This shift:      Physical Exam: General: Pt awake/alert/oriented x4 in no acute distress  Abdomen: Soft. Nondistended. Mildly tender at incisions only. Incision without erythema. No evidence of peritonitis. No incarcerated hernias.   Problem List:   Principal Problem:   Acute blood loss anemia Active Problems:   Type 2 diabetes mellitus with peripheral neuropathy ([name])   CKD (chronic kidney disease) stage 3, GFR 30-59 ml/min   Hx of CABG   Acute-on-chronic kidney injury ([place])   NSTEMI (non-ST elevated myocardial infarction) ([place])   Atrial fibrillation ([name])   History of GI diverticular bleed   DNR (do not resuscitate) discussion   Pressure ulcer   Diastolic dysfunction-grade 2 with EF 50-55%   Effusion of left knee joint   Acute GI bleeding   Acute gout    Results:   Labs: Results for orders placed or performed during the hospital encounter of 01/03/16 (from the past 48 hour(s))  Basic metabolic panel     Status: Abnormal   Collection Time: 01/18/16  7:58 AM  Result Value Ref Range   Sodium 138 135 - 145 mmol/L   Potassium 3.5 3.5 - 5.1 mmol/L   Chloride 103 101 - 111 mmol/L   CO2 25 22 - 32 mmol/L   Glucose, Bld 230 (H) 65 - 99 mg/dL   BUN 18 6 - 20 mg/dL   Creatinine, Ser 1.55 (H) 0.61 - 1.24 mg/dL   Calcium 8.5 (L) 8.9 - 10.3 mg/dL   GFR calc non Af Amer 40 (L) >60 mL/min   GFR calc Af Amer 47 (L) >60 mL/min    Comment: (NOTE) The eGFR has been calculated using the CKD EPI equation. This calculation has not been validated in all clinical situations. eGFR's persistently <60 mL/min signify possible Chronic Kidney Disease.    Anion gap 10 5 - 15  CBC     Status: Abnormal   Collection Time: 01/18/16  7:58 AM  Result Value Ref Range   WBC 9.3 4.0 - 10.5 K/uL   RBC 3.11 (L) 4.22 - 5.81 MIL/uL   Hemoglobin 9.1 (L) 13.0 - 17.0 g/dL   HCT 28.4 (L) 39.0 - 52.0 %   MCV 91.3 78.0 - 100.0 fL   MCH 29.3 26.0 - 34.0 pg   MCHC 32.0 30.0 - 36.0 g/dL   RDW 15.0 11.5 - 15.5 %   Platelets 214 150 -  400 K/uL  Glucose, capillary     Status: Abnormal   Collection Time: 01/18/16 12:36 PM  Result Value Ref Range   Glucose-Capillary 171 (H) 65 - 99 mg/dL   Comment 1 Notify RN    Comment 2 Document in Chart   Glucose, capillary     Status: Abnormal   Collection Time: 01/18/16  5:01 PM  Result Value Ref Range   Glucose-Capillary 166 (H) 65 - 99 mg/dL   Comment 1 Notify RN    Comment 2 Document in Chart   Glucose, capillary     Status: Abnormal   Collection Time: 01/18/16 11:30 PM  Result Value Ref Range   Glucose-Capillary 121 (H) 65 - 99 mg/dL   Comment 1 Notify RN    Comment 2 Document in Chart   Glucose, capillary     Status: Abnormal   Collection Time: 01/19/16  8:09 AM  Result Value Ref Range   Glucose-Capillary 161 (H) 65 - 99 mg/dL  CBC     Status: Abnormal   Collection Time: 01/19/16  8:33 AM  Result Value Ref Range   WBC 8.3 4.0 - 10.5 K/uL   RBC 2.85 (L) 4.22 - 5.81 MIL/uL   Hemoglobin 8.2 (L) 13.0 - 17.0 g/dL   HCT 25.8 (L) 39.0 - 52.0 %   MCV 90.5 78.0 - 100.0 fL   MCH 28.8 26.0 -  34.0 pg   MCHC 31.8 30.0 - 36.0 g/dL   RDW 15.2 11.5 - 15.5 %   Platelets 195 150 - 400 K/uL  Comprehensive metabolic panel     Status: Abnormal   Collection Time: 01/19/16  8:33 AM  Result Value Ref Range   Sodium 137 135 - 145 mmol/L   Potassium 3.4 (L) 3.5 - 5.1 mmol/L   Chloride 104 101 - 111 mmol/L   CO2 23 22 - 32 mmol/L   Glucose, Bld 172 (H) 65 - 99 mg/dL   BUN 15 6 - 20 mg/dL   Creatinine, Ser 1.62 (H) 0.61 - 1.24 mg/dL   Calcium 8.3 (L) 8.9 - 10.3 mg/dL   Total Protein 5.0 (L) 6.5 - 8.1 g/dL   Albumin 2.0 (L) 3.5 - 5.0 g/dL   AST 19 15 - 41 U/L   ALT 18 17 - 63 U/L   Alkaline Phosphatase 55 38 - 126 U/L   Total Bilirubin 0.8 0.3 - 1.2 mg/dL   GFR calc non Af Amer 38 (L) >60 mL/min   GFR calc Af Amer 44 (L) >60 mL/min    Comment: (NOTE) The eGFR has been calculated using the CKD EPI equation. This calculation has not been validated in all clinical situations. eGFR's persistently <60 mL/min signify possible Chronic Kidney Disease.    Anion gap 10 5 - 15  Glucose, capillary     Status: Abnormal   Collection Time: 01/19/16 11:56 AM  Result Value Ref Range   Glucose-Capillary 167 (H) 65 - 99 mg/dL  Prepare RBC     Status: None   Collection Time: 01/19/16  1:14 PM  Result Value Ref Range   Order Confirmation ORDER PROCESSED BY BLOOD BANK   Type and screen [place]     Status: None (Preliminary result)   Collection Time: 01/19/16  1:14 PM  Result Value Ref Range   ABO/RH(D) O NEG    Antibody Screen POS    Sample Expiration 01/22/2016    DAT, IgG NEG    Antibody Identification ANTI K  Unit Number [id number]    Blood Component Type RED CELLS,LR    Unit division 00    Status of Unit ISSUED    Transfusion Status OK TO TRANSFUSE    Crossmatch Result COMPATIBLE    Donor AG Type NEGATIVE FOR KELL ANTIGEN   Glucose, capillary     Status: Abnormal   Collection Time: 01/19/16  5:24 PM  Result Value Ref Range   Glucose-Capillary 161 (H) 65 - 99  mg/dL  Glucose, capillary     Status: Abnormal   Collection Time: 01/19/16 11:02 PM  Result Value Ref Range   Glucose-Capillary 144 (H) 65 - 99 mg/dL  CBC     Status: Abnormal   Collection Time: 01/20/16  5:00 AM  Result Value Ref Range   WBC 9.6 4.0 - 10.5 K/uL   RBC 3.19 (L) 4.22 - 5.81 MIL/uL   Hemoglobin 9.5 (L) 13.0 - 17.0 g/dL   HCT 29.0 (L) 39.0 - 52.0 %   MCV 90.9 78.0 - 100.0 fL   MCH 29.8 26.0 - 34.0 pg   MCHC 32.8 30.0 - 36.0 g/dL   RDW 15.2 11.5 - 15.5 %   Platelets 186 150 - 400 K/uL  Comprehensive metabolic panel     Status: Abnormal   Collection Time: 01/20/16  5:00 AM  Result Value Ref Range   Sodium 139 135 - 145 mmol/L   Potassium 3.5 3.5 - 5.1 mmol/L   Chloride 103 101 - 111 mmol/L   CO2 24 22 - 32 mmol/L   Glucose, Bld 159 (H) 65 - 99 mg/dL   BUN 14 6 - 20 mg/dL   Creatinine, Ser 1.64 (H) 0.61 - 1.24 mg/dL   Calcium 8.7 (L) 8.9 - 10.3 mg/dL   Total Protein 5.0 (L) 6.5 - 8.1 g/dL   Albumin 1.9 (L) 3.5 - 5.0 g/dL   AST 17 15 - 41 U/L   ALT 19 17 - 63 U/L   Alkaline Phosphatase 57 38 - 126 U/L   Total Bilirubin 0.8 0.3 - 1.2 mg/dL   GFR calc non Af Amer 38 (L) >60 mL/min   GFR calc Af Amer 44 (L) >60 mL/min    Comment: (NOTE) The eGFR has been calculated using the CKD EPI equation. This calculation has not been validated in all clinical situations. eGFR's persistently <60 mL/min signify possible Chronic Kidney Disease.    Anion gap 12 5 - 15    Imaging / Studies: Dg Knee Complete 4 Views Left  01/18/2016  CLINICAL DATA:  Left knee pain and swelling for 1 week. EXAM: LEFT KNEE - COMPLETE 4+ VIEW COMPARISON:  None. FINDINGS: No fracture or dislocation is noted. Mild suprapatellar joint effusion is noted. Severe narrowing of patellofemoral space is noted. Mild osteophyte formation is noted medially and laterally. Vascular calcifications are noted. IMPRESSION: Mild suprapatellar joint effusion. Severe degenerative joint disease of patellofemoral space. No  fracture or dislocation is noted. Electronically Signed   By: Marijo Conception, M.D.   On: 01/18/2016 09:29    Medications / Allergies:  Scheduled Meds: . sodium chloride   Intravenous Once  . aspirin EC  81 mg Oral Daily  . colchicine  0.6 mg Oral BID  . docusate sodium  100 mg Oral BID  . heparin  5,000 Units Subcutaneous 3 times per day  . insulin aspart  0-15 Units Subcutaneous TID WC  . insulin aspart  0-5 Units Subcutaneous QHS  . isosorbide mononitrate  60 mg Oral BID WC  .  metoprolol tartrate  100 mg Oral BID  . pantoprazole  40 mg Oral BID  . polyethylene glycol  17 g Oral Daily   Continuous Infusions:  PRN Meds:.diphenhydrAMINE **OR** diphenhydrAMINE, HYDROmorphone (DILAUDID) injection, naloxone **AND** sodium chloride flush, ondansetron **OR** ondansetron (ZOFRAN) IV, oxyCODONE-acetaminophen  Antibiotics: Anti-infectives    Start     Dose/Rate Route Frequency Ordered Stop   01/14/16 0800  cefoTEtan (CEFOTAN) 2 g in dextrose 5 % 50 mL IVPB     2 g 100 mL/hr over 30 Minutes Intravenous  Once 01/13/16 1755 01/14/16 0930   01/13/16 1415  cefoTEtan (CEFOTAN) 2 g in dextrose 5 % 50 mL IVPB  Status:  Discontinued     2 g 100 mL/hr over 30 Minutes Intravenous Every 12 hours 01/13/16 1412 01/13/16 1718   01/13/16 1012  cefoTEtan in Dextrose 5% (CEFOTAN) 2-2.08 GM-% IVPB    Comments:  Leandrew Koyanagi   : cabinet override      01/13/16 1012 01/13/16 2229   01/13/16 0600  cefoTEtan (CEFOTAN) 2 g in dextrose 5 % 50 mL IVPB     2 g 100 mL/hr over 30 Minutes Intravenous On call to O.R. 01/12/16 1307 01/13/16 1112   01/12/16 1400  neomycin (MYCIFRADIN) tablet 1,000 mg     1,000 mg Oral 3 times per day 01/12/16 1307 01/12/16 2332   01/12/16 1400  erythromycin (E-MYCIN) tablet 1,000 mg     1,000 mg Oral 3 times per day 01/12/16 1307 01/12/16 2332        Assessment/Plan Recurrent lower GI bleed POD#7 exploratory laparotomy, splenic flexure mobilization, extended left  hemicolectomy and sigmoidectomy---Dr. Rosendo Gros -tolerating diet, having bowel function, pain controlled -needs to mobilize -involve PT -DC Staples and apply steri strips on 3/13 ABLA-h&h are stable.  Left knee pain/effusion=gout-better NSTEMI-cards following CAD/CHF/PAF HTN DM-on SSI FEN-no issues. Colace/miralax. VTE prophylaxis-SCD/heparin Dispo-stable for DC From a surgical standpoint   Erby Pian, [facility] Pager [phone]) For consults and floor pages call [phone](7A-4:30P)  01/20/2016  7:50 AM

## 2016-01-20 NOTE — Progress Notes (Signed)
Physical Therapy Treatment Patient Details Name: [name] MRN: [id number] DOB: [birth year] Today's Date: 01/20/2016    History of Present Illness Pt presents for recurrent GI bleed after NSTEMI in 1/17, underwent ex lap on 3/3 with troponin trending up after surgery, has extensive cardiac history.    PT Comments    Pt was able to get up to walk with minor help but has a very short distance due to ongoing pain.  Discussed controlling the distance with him to avoid flares that will make following through on mobility too challenging for him.  SNF is going to be destination today.  Follow Up Recommendations  SNF     Equipment Recommendations  Rolling walker with 5" wheels    Recommendations for Other Services       Precautions / Restrictions Precautions Precautions: Fall Restrictions Weight Bearing Restrictions: No    Mobility  Bed Mobility Overal bed mobility: Modified Independent                Transfers Overall transfer level: Needs assistance Equipment used: Rolling walker (2 wheeled) Transfers: Sit to/from [employer] Transfers Sit to Stand: Min assist Stand pivot transfers: Min guard;Min assist          Ambulation/Gait Ambulation/Gait assistance: Min assist;Min guard Ambulation Distance (Feet): 20 Feet Assistive device: Rolling walker (2 wheeled) Gait Pattern/deviations: Step-through pattern;Decreased weight shift to left;Decreased stride length;Decreased stance time - left;Wide base of support;Trunk flexed Gait velocity: decreased Gait velocity interpretation: Below normal speed for age/gender General Gait Details: Pt controlled gait distance and use of walker due to pain from gouty change   Stairs            Wheelchair Mobility    Modified Rankin (Stroke Patients Only)       Balance Overall balance assessment: Needs assistance Sitting-balance support: Feet supported Sitting balance-Leahy Scale: Good       Standing  balance-Leahy Scale: Fair                      Cognition Arousal/Alertness: Awake/alert Behavior During Therapy: WFL for tasks assessed/performed Overall Cognitive Status: Within Functional Limits for tasks assessed                      Exercises      General Comments General comments (skin integrity, edema, etc.): pt is expecting to leave shortly but is motivated to work and wants to get home ASAP.  He is managing with narcotic meds to control pain.      Pertinent Vitals/Pain Pain Assessment: Faces Faces Pain Scale: Hurts little more Pain Location: L foot Pain Descriptors / Indicators: Burning Pain Intervention(s): Monitored during session;Premedicated before session    Home Living                      Prior Function            PT Goals (current goals can now be found in the care plan section) Acute Rehab PT Goals Patient Stated Goal: get stronger; decrease pain Progress towards PT goals: Progressing toward goals    Frequency  Min 3X/week    PT Plan Current plan remains appropriate    Co-evaluation             End of Session Equipment Utilized During Treatment: Gait belt Activity Tolerance: Patient limited by pain Patient left: in bed;with call bell/phone within reach;with family/visitor present     Time: 1355-1419 PT Time  Calculation (min) (ACUTE ONLY): 24 min  Charges:  $Gait Training: 8-22 mins $Therapeutic Activity: 8-22 mins                    G Codes:      Ramond Dial Feb 02, 2016, 3:19 PM   Mee Hives, PT MS Acute Rehab Dept. Number: [phone] [phone] and [place] [phone]

## 2016-01-20 NOTE — Discharge Instructions (Signed)
Remove staples and apply steri strips on 3/13 please    ABDOMINAL SURGERY: POST OP INSTRUCTIONS  1. DIET: Follow a light bland diet the first 24 hours after arrival home, such as soup, liquids, crackers, etc.  Be sure to include lots of fluids daily.  Avoid fast food or heavy meals as your are more likely to get nauseated.  Eat a low fat the next few days after surgery.   2. Take your usually prescribed home medications unless otherwise directed. 3. PAIN CONTROL: a. Pain is best controlled by a usual combination of three different methods TOGETHER: i. Ice/Heat ii. Over the counter pain medication iii. Prescription pain medication b. Most patients will experience some swelling and bruising around the incisions.  Ice packs or heating pads (30-60 minutes up to 6 times a day) will help. Use ice for the first few days to help decrease swelling and bruising, then switch to heat to help relax tight/sore spots and speed recovery.  Some people prefer to use ice alone, heat alone, alternating between ice & heat.  Experiment to what works for you.  Swelling and bruising can take several weeks to resolve.   c. It is helpful to take an over-the-counter pain medication regularly for the first few weeks.  Choose one of the following that works best for you: i. Naproxen (Aleve, etc)  Two 220mg  tabs twice a day ii. Ibuprofen (Advil, etc) Three 200mg  tabs four times a day (every meal & bedtime) iii. Acetaminophen (Tylenol, etc) 500-650mg  four times a day (every meal & bedtime) d. A  prescription for pain medication (such as oxycodone, hydrocodone, etc) should be given to you upon discharge.  Take your pain medication as prescribed.  i. If you are having problems/concerns with the prescription medicine (does not control pain, nausea, vomiting, rash, itching, etc), please call us [phone] to see if we need to switch you to a different pain medicine that will work better for you and/or control your side effect  better. ii. If you need a refill on your pain medication, please contact your pharmacy.  They will contact our office to request authorization. Prescriptions will not be filled after 5 pm or on week-ends. 4. Avoid getting constipated.  Between the surgery and the pain medications, it is common to experience some constipation.  Increasing fluid intake and taking a fiber supplement (such as Metamucil, Citrucel, FiberCon, MiraLax, etc) 1-2 times a day regularly will usually help prevent this problem from occurring.  A mild laxative (prune juice, Milk of Magnesia, MiraLax, etc) should be taken according to package directions if there are no bowel movements after 48 hours.   5. Watch out for diarrhea.  If you have many loose bowel movements, simplify your diet to bland foods & liquids for a few days.  Stop any stool softeners and decrease your fiber supplement.  Switching to mild anti-diarrheal medications (Kayopectate, Pepto Bismol) can help.  If this worsens or does not improve, please call us. 6. Wash / shower every day.  You may shower over the incision / wound.  Avoid baths until the skin is fully healed.  Continue to shower over incision(s) after the dressing is off. 7. Remove your waterproof bandages 5 days after surgery.  You may leave the incision open to air.  You may replace a dressing/Band-Aid to cover the incision for comfort if you wish. 8. ACTIVITIES as tolerated:   a. You may resume regular (light) daily activities beginning the next day--such as daily  self-care, walking, climbing stairs--gradually increasing activities as tolerated.  If you can walk 30 minutes without difficulty, it is safe to try more intense activity such as jogging, treadmill, bicycling, low-impact aerobics, swimming, etc. b. Save the most intensive and strenuous activity for last such as sit-ups, heavy lifting, contact sports, etc  Refrain from any heavy lifting or straining until you are off narcotics for pain control.    c. DO NOT PUSH THROUGH PAIN.  Let pain be your guide: If it hurts to do something, don't do it.  Pain is your body warning you to avoid that activity for another week until the pain goes down. d. You may drive when you are no longer taking prescription pain medication, you can comfortably wear a seatbelt, and you can safely maneuver your car and apply brakes. e. Dennis Bast may have sexual intercourse when it is comfortable.  9. FOLLOW UP in our office a. Please call [facility] at [phone] to set up an appointment to see your surgeon in the office for a follow-up appointment approximately 1-2 weeks after your surgery. b. Make sure that you call for this appointment the day you arrive home to insure a convenient appointment time. 10. IF YOU HAVE DISABILITY OR FAMILY LEAVE FORMS, BRING THEM TO THE OFFICE FOR PROCESSING.  DO NOT GIVE THEM TO YOUR DOCTOR.   WHEN TO CALL us [phone]: 1. Poor pain control 2. Reactions / problems with new medications (rash/itching, nausea, etc)  3. Fever over 101.5 F (38.5 C) 4. Inability to urinate 5. Nausea and/or vomiting 6. Worsening swelling or bruising 7. Continued bleeding from incision. 8. Increased pain, redness, or drainage from the incision  The clinic staff is available to answer your questions during regular business hours (8:30am-5pm).  Please dont hesitate to call and ask to speak to one of our nurses for clinical concerns.   A surgeon from [facility] is always on call at the hospitals   If you have a medical emergency, go to the nearest emergency room or call 911.    [facility], [place], [place], [address], [place]  [id number] ? MAIN: [phone] ? TOLL FREE: [phone] ? FAX [phone] [email or web address]

## 2016-01-20 NOTE — Clinical Social Work Placement (Signed)
   CLINICAL SOCIAL WORK PLACEMENT  NOTE  Date:  01/20/2016  Patient Details  Name: [name] MRN: [id number] Date of Birth: 11/20/1931  Clinical Social Work is seeking post-discharge placement for this patient at the [place] level of care (*CSW will initial, date and re-position this form in  chart as items are completed):  Yes   Patient/family provided with [place] Work Department's list of facilities offering this level of care within the geographic area requested by the patient (or if unable, by the patient's family).  Yes   Patient/family informed of their freedom to choose among providers that offer the needed level of care, that participate in Medicare, Medicaid or managed care program needed by the patient, have an available bed and are willing to accept the patient.  Yes   Patient/family informed of 's ownership interest in [facility] and [facility], as well as of the fact that they are under no obligation to receive care at these facilities.  PASRR submitted to EDS on 01/18/16     PASRR number received on 01/18/16     Existing PASRR number confirmed on       FL2 transmitted to all facilities in geographic area requested by pt/family on 01/18/16     FL2 transmitted to all facilities within larger geographic area on       Patient informed that his/her managed care company has contracts with or will negotiate with certain facilities, including the following:        Yes   Patient/family informed of bed offers received.  Patient chooses bed at [facility]     Physician recommends and patient chooses bed at      Patient to be transferred to [facility] on 01/20/16.  Patient to be transferred to facility by [facility]     Patient family notified on 01/20/16 of transfer.  Name of family member notified:  Patient oriented     PHYSICIAN       Additional Comment:     _______________________________________________ Benard Halsted, [place] 01/20/2016, 12:44 PM

## 2016-01-20 NOTE — Care Management Note (Signed)
Case Management Note  Patient Details  Name: [name] MRN: [id number] Date of Birth: 06/17/1932  Subjective/Objective:                    Action/Plan: Plan to for pt to d/c to SNF today, no further needs present for CM.  Expected Discharge Date:  01/20/16          Expected Discharge Plan:  Skilled Nursing Facility([facility])  In-House Referral:  Clinical Social Work  Discharge planning Services  CM Consult  Post Acute Care Choice:    Choice offered to:     DME Arranged:    DME Agency:     HH Arranged:    [place] Agency:     Status of Service:  Completed, signed off  Medicare Important Message Given:  Yes Date Medicare IM Given:    Medicare IM give by:    Date Additional Medicare IM Given:    Additional Medicare Important Message give by:     If discussed at [place] of Stay Meetings, dates discussed:    Additional Comments:  Sharin Mons, [place] [phone] 01/20/2016, 2:41 PM

## 2016-01-20 NOTE — Progress Notes (Signed)
Patient will DC to: [facility] Anticipated DC date: 01/20/16 Family notified: Patient oriented Transport by: [facility] 2pm  CSW signing off.  Cedric Fishman, [place] Social Worker [phone]

## 2016-01-21 DIAGNOSIS — I208 Other forms of angina pectoris: Secondary | ICD-10-CM | POA: Diagnosis not present

## 2016-01-21 DIAGNOSIS — I4891 Unspecified atrial fibrillation: Secondary | ICD-10-CM | POA: Diagnosis not present

## 2016-01-21 DIAGNOSIS — E119 Type 2 diabetes mellitus without complications: Secondary | ICD-10-CM | POA: Diagnosis not present

## 2016-01-21 DIAGNOSIS — K922 Gastrointestinal hemorrhage, unspecified: Secondary | ICD-10-CM | POA: Diagnosis not present

## 2016-01-22 ENCOUNTER — Telehealth: Payer: Self-pay | Admitting: *Deleted

## 2016-01-22 NOTE — Telephone Encounter (Signed)
Transitional care call attempted.  Left message for patient to return call. 

## 2016-01-25 DIAGNOSIS — S31155D Open bite of abdominal wall, periumbilic region without penetration into peritoneal cavity, subsequent encounter: Secondary | ICD-10-CM | POA: Diagnosis not present

## 2016-01-25 DIAGNOSIS — R600 Localized edema: Secondary | ICD-10-CM | POA: Diagnosis not present

## 2016-01-25 DIAGNOSIS — R531 Weakness: Secondary | ICD-10-CM | POA: Insufficient documentation

## 2016-01-25 NOTE — Telephone Encounter (Signed)
Transitional care call attempted.  Left message for patient to return call. 

## 2016-01-26 NOTE — Telephone Encounter (Signed)
Transitional care call attempted.  Left message for patient to return call. 

## 2016-01-27 NOTE — Telephone Encounter (Signed)
Unable to reach patient after multiple attempts.  Follow scheduled 02/01/16 at 1130.

## 2016-02-01 ENCOUNTER — Ambulatory Visit: Payer: Medicare Other | Admitting: Family Medicine

## 2016-02-01 ENCOUNTER — Telehealth: Payer: Self-pay | Admitting: Family Medicine

## 2016-02-01 NOTE — Telephone Encounter (Signed)
Please contact him re: f/u.  Please don't charge him for the no show.  Thanks.

## 2016-02-01 NOTE — Telephone Encounter (Signed)
Patient did not come for their scheduled appointment today fo2 mos FU - DM / [facility]sf *TCM* [facility] f/u GI bleed/grr .  Please let me know if the patient needs to be contacted immediately for follow up or if no follow up is necessary.

## 2016-02-02 NOTE — Telephone Encounter (Signed)
Thanks

## 2016-02-02 NOTE — Telephone Encounter (Signed)
I have spoken with the patient.  He is in rehab right now and had forgotten he had the appt.  He will reschedule once he gets home from rehab.  Please don't charge for No Show.

## 2016-02-03 DIAGNOSIS — I208 Other forms of angina pectoris: Secondary | ICD-10-CM | POA: Diagnosis not present

## 2016-02-03 DIAGNOSIS — N183 Chronic kidney disease, stage 3 (moderate): Secondary | ICD-10-CM | POA: Diagnosis not present

## 2016-02-03 DIAGNOSIS — E119 Type 2 diabetes mellitus without complications: Secondary | ICD-10-CM | POA: Diagnosis not present

## 2016-02-05 ENCOUNTER — Encounter: Payer: Self-pay | Admitting: *Deleted

## 2016-02-05 ENCOUNTER — Ambulatory Visit: Payer: Medicare Other | Admitting: Cardiology

## 2016-02-18 ENCOUNTER — Ambulatory Visit (INDEPENDENT_AMBULATORY_CARE_PROVIDER_SITE_OTHER): Payer: Medicare Other | Admitting: Family Medicine

## 2016-02-18 ENCOUNTER — Encounter: Payer: Self-pay | Admitting: Family Medicine

## 2016-02-18 VITALS — BP 116/58 | HR 78 | Temp 98.6°F | Wt 178.5 lb

## 2016-02-18 DIAGNOSIS — E1140 Type 2 diabetes mellitus with diabetic neuropathy, unspecified: Secondary | ICD-10-CM

## 2016-02-18 DIAGNOSIS — D62 Acute posthemorrhagic anemia: Secondary | ICD-10-CM | POA: Diagnosis not present

## 2016-02-18 DIAGNOSIS — D649 Anemia, unspecified: Secondary | ICD-10-CM | POA: Diagnosis not present

## 2016-02-18 DIAGNOSIS — L8990 Pressure ulcer of unspecified site, unspecified stage: Secondary | ICD-10-CM

## 2016-02-18 DIAGNOSIS — I5042 Chronic combined systolic (congestive) and diastolic (congestive) heart failure: Secondary | ICD-10-CM | POA: Diagnosis not present

## 2016-02-18 DIAGNOSIS — I208 Other forms of angina pectoris: Secondary | ICD-10-CM

## 2016-02-18 DIAGNOSIS — Z794 Long term (current) use of insulin: Secondary | ICD-10-CM

## 2016-02-18 MED ORDER — METOPROLOL TARTRATE 100 MG PO TABS: 50.0000 mg | ORAL_TABLET | Freq: Two times a day (BID) | ORAL | Status: DC

## 2016-02-18 MED ORDER — TRAMADOL HCL 50 MG PO TABS: 50.0000 mg | ORAL_TABLET | Freq: Two times a day (BID) | ORAL | Status: DC | PRN

## 2016-02-18 MED ORDER — FUROSEMIDE 80 MG PO TABS: 80.0000 mg | ORAL_TABLET | Freq: Every day | ORAL | Status: DC

## 2016-02-18 NOTE — Progress Notes (Signed)
Pre visit review using our clinic review tool, if applicable. No additional management support is needed unless otherwise documented below in the visit note.  D/c summary d/w pt.  Now at home after rehab stay.   Admit date: 01/03/2016 Discharge date: 01/20/2016  Discharge Diagnoses:   Principal Problem:  Acute blood loss anemia Active Problems:  Type 2 diabetes mellitus with peripheral neuropathy ([name])  CKD (chronic kidney disease) stage 3, GFR 30-59 ml/min  Hx of CABG  Acute-on-chronic kidney injury ([place])  NSTEMI (non-ST elevated myocardial infarction) ([place])  Atrial fibrillation ([name])  History of GI diverticular bleed  DNR (do not resuscitate) discussion  Pressure ulcer  Diastolic dysfunction-grade 2 with EF 50-55%  Effusion of left knee joint  Acute GI bleeding  Acute gout    Follow-up recommendations Follow-up with PCP in 3-5 days , including all additional recommended appointments as below Follow-up CBC, CMP in 3-5 days patient to follow-up with cardiology as scheduled       Discharge Condition: Stable   Discharge Instructions       Discharge Instructions    Diet - low sodium heart healthy  Complete by: As directed      Increase activity slowly  Complete by: As directed           Current Discharge Medication List    START taking these medications   Details  docusate sodium (COLACE) 100 MG capsule Take 1 capsule (100 mg total) by mouth 2 (two) times daily. Qty: 60 capsule, Refills: 0    metoprolol (LOPRESSOR) 100 MG tablet Take 1 tablet (100 mg total) by mouth 2 (two) times daily. Qty: 60 tablet, Refills: 1    oxyCODONE (ROXICODONE) 5 MG immediate release tablet Take 1 tablet (5 mg total) by mouth every 4 (four) hours as needed for severe pain. Qty: 30 tablet, Refills: 0    pantoprazole (PROTONIX) 40 MG tablet Take 1 tablet (40 mg total) by mouth 2 (two) times daily. Qty: 60 tablet, Refills: 1     polyethylene glycol (MIRALAX / GLYCOLAX) packet Take 17 g by mouth daily. Qty: 14 each, Refills: 0      CONTINUE these medications which have CHANGED   Details  furosemide (LASIX) 80 MG tablet Take 0.5 tablets (40 mg total) by mouth daily. Qty: 30 tablet, Refills: 0    isosorbide mononitrate (IMDUR) 60 MG 24 hr tablet Take 1 tablet (60 mg total) by mouth 2 (two) times daily with a meal. Qty: 120 tablet, Refills: 2      CONTINUE these medications which have NOT CHANGED   Details  aspirin 81 MG tablet Take 81 mg by mouth daily.    BD INSULIN SYRINGE ULTRAFINE 31G X 5/16" 0.3 ML MISC USE DAILY AS INSTRUCTED Qty: 100 each, Refills: 2    Cholecalciferol (VITAMIN D) 1000 UNITS capsule Take 1,000 Units by mouth daily.     colchicine 0.6 MG tablet Take 1 tablet (0.6 mg total) by mouth daily as needed (gout flare up).    fish oil-omega-3 fatty acids 1000 MG capsule Take 1 g by mouth daily.     insulin glargine (LANTUS) 100 UNIT/ML injection Inject 0.15 mLs (15 Units total) into the skin at bedtime. Qty: 10 mL    magnesium oxide (MAG-OX) 400 MG tablet Take 400 mg by mouth daily.     Multiple Vitamin (MULTIVITAMIN WITH MINERALS) TABS Take 1 tablet by mouth daily.    nitroGLYCERIN (NITROSTAT) 0.4 MG SL tablet Place 1 tablet (0.4 mg total) under  the tongue every 5 (five) minutes as needed for chest pain. Qty: 100 tablet, Refills: 1    Potassium Gluconate (K-99) 595 MG CAPS Take 595 mg by mouth daily.     ranolazine (RANEXA) 500 MG 12 hr tablet Take 1 tablet (500 mg total) by mouth 2 (two) times daily. Qty: 90 tablet, Refills: 3   Associated Diagnoses: Bilateral carotid artery disease ([name])    rosuvastatin (CRESTOR) 20 MG tablet Take 1 tablet (20 mg total) by mouth daily. Qty: 90 tablet, Refills: 1      STOP taking these medications     TOPROL XL 50 MG 24 hr tablet      traMADol (ULTRAM) 50 MG tablet         No Known Allergies    Disposition: 01-Home or Self Care   Consults:  Cardiology     Significant Diagnostic Studies:   Imaging Results    Dg Chest 2 View  01/03/2016 CLINICAL DATA: 82 year old male with chest pain EXAM: CHEST 2 VIEW COMPARISON: Radiograph dated 12/18/2015 FINDINGS: Two views of the chest demonstrate stable minimal left lung base linear atelectasis. There is no focal consolidation, pleural effusion, or pneumothorax. Stable cardiac silhouette. Median sternotomy wires and CABG vascular clips. No acute osseous pathology. IMPRESSION: No active cardiopulmonary disease. Electronically Signed By: Anner Crete M.D. On: 01/03/2016 03:02   Nm Gi Blood Loss  01/10/2016 CLINICAL DATA: GI hemorrhage and rectal bleeding EXAM: NUCLEAR MEDICINE GASTROINTESTINAL BLEEDING SCAN TECHNIQUE: Sequential abdominal images were obtained following intravenous administration of Tc-59mlabeled red blood cells. RADIOPHARMACEUTICALS: 25.1 mCi Tc-968mn-vitro labeled red cells. COMPARISON: 01/03/2016 FINDINGS: There are now areas of increased activity identified in the left mid abdomen which demonstrate mobility and likely lie within the distal transverse colon and proximal descending colon consistent with acute hemorrhage. No other focal area of hemorrhage is seen. IMPRESSION: Areas of increased activity in the far lateral left abdomen likely related to the distal transverse and proximal descending colon. Electronically Signed By: MaInez Catalina.D. On: 01/10/2016 16:30   Nm Gi Blood Loss  01/03/2016 CLINICAL DATA: History of GI diverticular bleed. GI bleeding. Dark blood in the stools noted 4 days ago. EXAM: NUCLEAR MEDICINE GASTROINTESTINAL BLEEDING SCAN TECHNIQUE: Sequential abdominal images were obtained following intravenous administration of Tc-9961mbeled red blood cells. RADIOPHARMACEUTICALS: 25.0 mCi Tc-52m43mvitro labeled red cells. COMPARISON: 09/13/2008 FINDINGS: Early  level activity is identified. Delayed images are performed, demonstrating no site of active bleeding. IMPRESSION: A site of active bleeding is not identified. Electronically Signed By: ElizNolon Nations. On: 01/03/2016 16:27   Dg Knee Complete 4 Views Left  01/18/2016 CLINICAL DATA: Left knee pain and swelling for 1 week. EXAM: LEFT KNEE - COMPLETE 4+ VIEW COMPARISON: None. FINDINGS: No fracture or dislocation is noted. Mild suprapatellar joint effusion is noted. Severe narrowing of patellofemoral space is noted. Mild osteophyte formation is noted medially and laterally. Vascular calcifications are noted. IMPRESSION: Mild suprapatellar joint effusion. Severe degenerative joint disease of patellofemoral space. No fracture or dislocation is noted. Electronically Signed By: JameMarijo ConceptionD. On: 01/18/2016 09:29        Filed Weights   01/18/16 0500 01/19/16 0612 01/20/16 0500  Weight: 83.7 kg (184 lb 8.4 oz) 83.235 kg (183 lb 8 oz) 84.2 kg (185 lb 10 oz)     Microbiology: Recent Results (from the past 240 hour(s))  Surgical pcr screen Status: None   Collection Time: 01/13/16 6:19 AM  Result Value Ref Range Status   MRSA, PCR  NEGATIVE NEGATIVE Final   Staphylococcus aureus NEGATIVE NEGATIVE Final    Comment:   The Xpert SA Assay (FDA approved for NASAL specimens in patients over 60 years of age), is one component of a comprehensive surveillance program. Test performance has been validated by [facility] for patients greater than or equal to 44 year old. It is not intended to diagnose infection nor to guide or monitor treatment.        Blood Culture  Labs (Brief)       Component Value Date/Time   SDES BLOOD LEFT HAND 06/14/2014 2225   SPECREQUEST BOTTLES DRAWN AEROBIC ONLY 5CC 06/14/2014 2225   CULT  06/14/2014 2225    NO GROWTH 5 DAYS Performed at [facility]  06/23/2014 FINAL 06/14/2014 2225        Labs:  Lab Results Last 48 Hours    Results for orders placed or performed during the hospital encounter of 01/03/16 (from the past 48 hour(s))  Glucose, capillary Status: Abnormal   Collection Time: 01/18/16 12:36 PM  Result Value Ref Range   Glucose-Capillary 171 (H) 65 - 99 mg/dL   Comment 1 Notify RN    Comment 2 Document in Chart   Glucose, capillary Status: Abnormal   Collection Time: 01/18/16 5:01 PM  Result Value Ref Range   Glucose-Capillary 166 (H) 65 - 99 mg/dL   Comment 1 Notify RN    Comment 2 Document in Chart   Glucose, capillary Status: Abnormal   Collection Time: 01/18/16 11:30 PM  Result Value Ref Range   Glucose-Capillary 121 (H) 65 - 99 mg/dL   Comment 1 Notify RN    Comment 2 Document in Chart   Glucose, capillary Status: Abnormal   Collection Time: 01/19/16 8:09 AM  Result Value Ref Range   Glucose-Capillary 161 (H) 65 - 99 mg/dL  CBC Status: Abnormal   Collection Time: 01/19/16 8:33 AM  Result Value Ref Range   WBC 8.3 4.0 - 10.5 K/uL   RBC 2.85 (L) 4.22 - 5.81 MIL/uL   Hemoglobin 8.2 (L) 13.0 - 17.0 g/dL   HCT 25.8 (L) 39.0 - 52.0 %   MCV 90.5 78.0 - 100.0 fL   MCH 28.8 26.0 - 34.0 pg   MCHC 31.8 30.0 - 36.0 g/dL   RDW 15.2 11.5 - 15.5 %   Platelets 195 150 - 400 K/uL  Comprehensive metabolic panel Status: Abnormal   Collection Time: 01/19/16 8:33 AM  Result Value Ref Range   Sodium 137 135 - 145 mmol/L   Potassium 3.4 (L) 3.5 - 5.1 mmol/L   Chloride 104 101 - 111 mmol/L   CO2 23 22 - 32 mmol/L   Glucose, Bld 172 (H) 65 - 99 mg/dL   BUN 15 6 - 20 mg/dL   Creatinine, Ser 1.62 (H) 0.61 - 1.24 mg/dL   Calcium 8.3 (L) 8.9 - 10.3 mg/dL   Total Protein 5.0 (L) 6.5 - 8.1 g/dL   Albumin 2.0 (L) 3.5 - 5.0 g/dL   AST 19 15 -  41 U/L   ALT 18 17 - 63 U/L   Alkaline Phosphatase 55 38 - 126 U/L   Total Bilirubin 0.8 0.3 - 1.2 mg/dL   GFR calc non Af Amer 38 (L) >60 mL/min   GFR calc Af Amer 44 (L) >60 mL/min    Comment: (NOTE) The eGFR has been calculated using the CKD EPI equation. This calculation has not been validated in all clinical situations. eGFR's persistently <  60 mL/min signify possible Chronic Kidney Disease.    Anion gap 10 5 - 15  Glucose, capillary Status: Abnormal   Collection Time: 01/19/16 11:56 AM  Result Value Ref Range   Glucose-Capillary 167 (H) 65 - 99 mg/dL  Prepare RBC Status: None   Collection Time: 01/19/16 1:14 PM  Result Value Ref Range   Order Confirmation ORDER PROCESSED BY BLOOD BANK   Type and screen [facility] Status: None (Preliminary result)   Collection Time: 01/19/16 1:14 PM  Result Value Ref Range   ABO/RH(D) O NEG    Antibody Screen POS    Sample Expiration 01/22/2016    DAT, IgG NEG    Antibody Identification ANTI K    Unit Number [id number]    Blood Component Type RED CELLS,LR    Unit division 00    Status of Unit ISSUED    Transfusion Status OK TO TRANSFUSE    Crossmatch Result COMPATIBLE    Donor AG Type NEGATIVE FOR KELL ANTIGEN   Glucose, capillary Status: Abnormal   Collection Time: 01/19/16 5:24 PM  Result Value Ref Range   Glucose-Capillary 161 (H) 65 - 99 mg/dL  Glucose, capillary Status: Abnormal   Collection Time: 01/19/16 11:02 PM  Result Value Ref Range   Glucose-Capillary 144 (H) 65 - 99 mg/dL  CBC Status: Abnormal   Collection Time: 01/20/16 5:00 AM  Result Value Ref Range   WBC 9.6 4.0 - 10.5 K/uL   RBC 3.19 (L) 4.22 - 5.81 MIL/uL   Hemoglobin 9.5 (L) 13.0 - 17.0 g/dL   HCT 92.9 (L) 41.6 - 50.8 %   MCV 90.9 78.0 - 100.0 fL   MCH 29.8  26.0 - 34.0 pg   MCHC 32.8 30.0 - 36.0 g/dL   RDW 13.6 53.1 - 13.9 %   Platelets 186 150 - 400 K/uL  Comprehensive metabolic panel Status: Abnormal   Collection Time: 01/20/16 5:00 AM  Result Value Ref Range   Sodium 139 135 - 145 mmol/L   Potassium 3.5 3.5 - 5.1 mmol/L   Chloride 103 101 - 111 mmol/L   CO2 24 22 - 32 mmol/L   Glucose, Bld 159 (H) 65 - 99 mg/dL   BUN 14 6 - 20 mg/dL   Creatinine, Ser 4.91 (H) 0.61 - 1.24 mg/dL   Calcium 8.7 (L) 8.9 - 10.3 mg/dL   Total Protein 5.0 (L) 6.5 - 8.1 g/dL   Albumin 1.9 (L) 3.5 - 5.0 g/dL   AST 17 15 - 41 U/L   ALT 19 17 - 63 U/L   Alkaline Phosphatase 57 38 - 126 U/L   Total Bilirubin 0.8 0.3 - 1.2 mg/dL   GFR calc non Af Amer 38 (L) >60 mL/min   GFR calc Af Amer 44 (L) >60 mL/min    Comment: (NOTE) The eGFR has been calculated using the CKD EPI equation. This calculation has not been validated in all clinical situations. eGFR's persistently <60 mL/min signify possible Chronic Kidney Disease.    Anion gap 12 5 - 15  Glucose, capillary Status: Abnormal   Collection Time: 01/20/16 7:55 AM  Result Value Ref Range   Glucose-Capillary 151 (H) 65 - 99 mg/dL       Lipid Panel   Labs (Brief)       Component Value Date/Time   CHOL 128 12/19/2015 0043   TRIG 95 12/19/2015 0043   HDL 39* 12/19/2015 0043   CHOLHDL 3.3 12/19/2015 0043   VLDL 19 12/19/2015 0043  [place] 70 12/19/2015 0043        Recent Labs    Lab Results  Component Value Date   HGBA1C 6.0* 01/03/2016   HGBA1C 6.0* 12/31/2015   HGBA1C 6.9 09/24/2015        Recent Labs    Lab Results  Component Value Date   MICROALBUR 2.7* 12/26/2014   LDLCALC 70 12/19/2015   CREATININE 1.64* 01/20/2016       HPI :Mr. Killgore is a pleasant 82 year old Caucasian male with past medical history  of severe diverticulosis, multiple GI bleed since 1981, carotid artery disease s/p R CEA by [name] '94, HTN, HLD, DM, CKD stage IV, CAD s/p CABG 1996 with redo 2006 by [name], and h/o PAF not on coumadin due to recurrent GI bleed. He was hospitalized in late July 2015 for unstable angina. Cardiac catheterization revealed high-grade lesion in SVG to OM which was stented in a staged fashion on 7/31 with Promus Premier 4.0x67m DES. Postprocedure, he had prolonged chest pain for 12 hours. P2Y12 study was suboptimal at 266. He was brought back to the cath lab on 06/14/2014 which revealed a segmental 50-60% stenosis proximal to the previously placed stent which was treated with 4.0 x 60 mm Promus Premier DES. He was felt to be a Plavix nonresponder, Plavix was changed to Brilinta. According to the patient, since 1981, he has had at least 5 admissions for GI bleed. He states he usually have GI bleed at least twice a year, and most of the time, he does not need to come to the hospital and would self resolve within 2 days. He was previously taken off of Coumadin due to concern for GI bleed, however continued to have episodic blood in the stool despite this.   He was recently admitted from 11/13/2015 until 11/21/2015 with recurrent GI bleed requiring transfusion. Hospital course was complicated by recurrence of NSTEMI and pneumonia. Due to stage IV CKD, and active bleeding, cardiology was unable to cath him. However EKG at the time shows new ST depression in the lateral leads. Elevated troponin. And also echocardiogram showed new wall motion abnormality in the anterior myocardium. Due to active bleeding, his Brilinta was stopped, he was continued on aspirin. Ranexa was added for antianginal effect, amlodipine was discontinued. He had GI workup with clipping. Since discharge, he continued to have mild chest discomfort and shortness of breath with minimal exertion.  Unfortunately despite previous GI clipping, he  started note dark stools on Tuesday 12/29/2015, after 4 days of GI bleed, [facility] He has been evaluated by both gastroenterology and surgery team. He underwent diagnostic colonoscopy on 01/05/2016 which showed pancolonic diverticulosis worse on the left colon. He was felt to be bleeding from the left colon given his history of recurrent GI bleed as well and previous GI workup. RBC tracer image showed area of increased activity in the left lateral abdomen likely represent distal transverse and proximal descending colon acute hemorrhage. Cardiology has been consulted for preoperative clearance. Of note, current EKG still shows ST depression in the lateral leads.  HOSPITAL COURSE:   Acute GI bleed/Acute blood loss anemia - Recurrent diverticular bleed despite cessation of Brilinta 5-6 weeks ago. - Colonoscopy 01/05/2016 revealed diverticulosis: severe in the left colon, moderate in the transverse colon and mild in the right colon. No evidence of active bleeding or old blood. - Nuclear RBC scan 01/10/2016 revealed increased activity in the left mid-abd,  likely the distal transverse colon and proximal descending colon consistent with acute hemorrhage. - Cardiology cleared patient for surgery without further testing; patient is a high-risk surgical candidate - Suspected LGIB from diverticuli;open left hemicolectomy and sigmoidectomy 3/1 - received a total of 7 units of packed red blood cells this admission tolerating diet, DC PCA and PO pain meds -Doing well from GI surgical standpoint. Hemoglobin change from 9.2> 7.7, > 9.3> 9.7>9.1>8.2>9.5 No recurrent bleeding This Hospital course complicated by non-ST elevation MI postoperatively   Left knee pain Mild suprapatellar joint effusion. Severe degenerative joint disease of patellofemoral space, may need outpatient orthopedic cortisone injection Restarted colchicine , knee pain is  improving  Acute blood loss anemia Status post transfusion of 6 units of packed red blood cells this admission, Follow CBC closely, goal hemoglobin greater than 9.0 in the setting of non-ST elevation MI Will transfuse one more unit today   CKD stage 3 - Appears euvolemic. Cr improving  Chronic combined systolic and diastolic CHF/Acute on chronic diastolic (congestive) heart failure - Appears euvolemic - Cardiology following , needs echocardiogram done today to reassess EF post MI  Chest pain with coronary artery disease as well as NSTEMI. Now with suspected recurrent non-ST elevation MI ,CAD s/p CABG 1996 with redo 2006 by [name]. likely also has significant underlying coronary artery disease given persistent ST depression in the lateral leads, has anterior wall motion abnormality seen on recent echocardiogram despite having a stable EF of 50-55%. ECG post op slightly more pronounced ST depression. Developed recurrent anginal symptoms and found to have recurrent non-ST elevation MI postoperatively Managed by cardiology with metoprolol, nitroglycerin, heparin, . Troponin peak 16 now decreasing c/w recurrent NSTEMI. weaned off nitroglycerin --Continue isosorbide 60 mg metoprolol to 100 mg twice a day --Stopped IV heparin - treated for non-ST elevation myocardial infarction.  --At this time, no cardiac catheterization. Continue with aggressive medical management. Chest pain-free post MI echo for LVF , LV EF: 55% - 60% hemoglobin is lower today than had been before. Avoid adding clopidogrel this time. Cardiology has signed off and arranged for a follow up   Diabetes mellitus - HgbA1c: 6.0 on arrival - Continue levemir    History of Atrial fibrillation  - No previous anticoagulation due to recurrent GIB - Current NSR  Essential hypertension - Blood pressure well controlled.  - Continue metoprolol      To recap, admitted with recurrent GIB, required mult  transfusions and partial colectomy, complicated by non-ST elevation MI postoperatively.  His antiplatelet meds were altered and his bleeding subsided.  He developed a bedsore that is now healing.  S/p rehab stay.  He has less stamina on the higher dose of BB, so he tapered from '200mg'$  down to '100mg'$  daily.  He is eating well.  No blood in stool.  No dark stools.  Not SOB, no CP, but he is still relatively deconditioned compared to his baseline status.  He has been on '80mg'$  lasix daily, w/ minimal edema.    He does continue to have joint pain and needed refill on tramadol, done at rx and faxed to pharmacy.    Due for f/u labs.  He has seen surgery at post op visit in the meantime.   PMH and SH reviewed  ROS: See HPI, otherwise noncontributory.  Meds, vitals, and allergies reviewed.   GEN: nad, alert and oriented HEENT: mucous membranes moist NECK: supple w/o LA CV: Occ ectopy noted, not tachy PULM: ctab, no inc wob ABD:  soft, +bs, healed midline scar noted.  EXT: trace (L>R) BLE edema SKIN: no acute rash but small shallow and superficial healing bedsore noted in the upper gluteal crease, doesn't appear infected.

## 2016-02-18 NOTE — Patient Instructions (Signed)
Go to the lab on the way out.  We'll contact you with your lab report. Keep using the barrier cream, sit on a pillow as needed.  Take protein/meat with each meal and keep moving.  Take care.  Glad to see you.

## 2016-02-19 ENCOUNTER — Encounter: Payer: Self-pay | Admitting: Family Medicine

## 2016-02-19 LAB — CBC WITH DIFFERENTIAL/PLATELET
Basophils Absolute: 0 10*3/uL (ref 0.0–0.1)
Basophils Relative: 0.4 % (ref 0.0–3.0)
EOS PCT: 6.5 % — AB (ref 0.0–5.0)
Eosinophils Absolute: 0.4 10*3/uL (ref 0.0–0.7)
HEMATOCRIT: 32.9 % — AB (ref 39.0–52.0)
HEMOGLOBIN: 10.9 g/dL — AB (ref 13.0–17.0)
Lymphocytes Relative: 25.7 % (ref 12.0–46.0)
Lymphs Abs: 1.8 10*3/uL (ref 0.7–4.0)
MCHC: 33 g/dL (ref 30.0–36.0)
MCV: 87.5 fl (ref 78.0–100.0)
MONOS PCT: 10.7 % (ref 3.0–12.0)
Monocytes Absolute: 0.7 10*3/uL (ref 0.1–1.0)
Neutro Abs: 3.9 10*3/uL (ref 1.4–7.7)
Neutrophils Relative %: 56.7 % (ref 43.0–77.0)
Platelets: 181 10*3/uL (ref 150.0–400.0)
RBC: 3.76 Mil/uL — AB (ref 4.22–5.81)
RDW: 17.4 % — ABNORMAL HIGH (ref 11.5–15.5)
WBC: 6.9 10*3/uL (ref 4.0–10.5)

## 2016-02-19 LAB — COMPREHENSIVE METABOLIC PANEL
ALBUMIN: 3.9 g/dL (ref 3.5–5.2)
ALK PHOS: 73 U/L (ref 39–117)
ALT: 13 U/L (ref 0–53)
AST: 18 U/L (ref 0–37)
BUN: 26 mg/dL — ABNORMAL HIGH (ref 6–23)
CHLORIDE: 100 meq/L (ref 96–112)
CO2: 35 mEq/L — ABNORMAL HIGH (ref 19–32)
Calcium: 10 mg/dL (ref 8.4–10.5)
Creatinine, Ser: 2.25 mg/dL — ABNORMAL HIGH (ref 0.40–1.50)
GFR: 29.83 mL/min — AB (ref >60.00)
Glucose, Bld: 112 mg/dL — ABNORMAL HIGH (ref 70–99)
POTASSIUM: 4.2 meq/L (ref 3.5–5.1)
Sodium: 141 mEq/L (ref 135–145)
TOTAL PROTEIN: 7.4 g/dL (ref 6.0–8.3)
Total Bilirubin: 0.5 mg/dL (ref 0.2–1.2)

## 2016-02-19 MED ORDER — FUROSEMIDE 80 MG PO TABS
40.0000 mg | ORAL_TABLET | Freq: Every day | ORAL | Status: DC
Start: 2016-02-19 — End: 2016-02-23

## 2016-02-19 NOTE — Assessment & Plan Note (Signed)
Recheck cbc today, no bleeding per patient report. Continue with low dose aspirin.  He agrees.  See notes on labs.

## 2016-02-19 NOTE — Assessment & Plan Note (Signed)
See notes on labs re: cr.  Okay for outpatient f/u.  Would change metoprolol form 100mg  qd to 50mg  BID, d/w pt.  He agrees.

## 2016-02-19 NOTE — Assessment & Plan Note (Signed)
Continue barrier cream and inc protein intake, off load and update me as needed.  Healing, should resolve. >25 minutes spent in face to face time with patient, >50% spent in counselling or coordination of care.

## 2016-02-19 NOTE — Assessment & Plan Note (Signed)
Continue as is, we can recheck in a few months.

## 2016-02-22 ENCOUNTER — Other Ambulatory Visit (INDEPENDENT_AMBULATORY_CARE_PROVIDER_SITE_OTHER): Payer: Medicare Other

## 2016-02-22 DIAGNOSIS — I5042 Chronic combined systolic (congestive) and diastolic (congestive) heart failure: Secondary | ICD-10-CM

## 2016-02-22 LAB — BASIC METABOLIC PANEL
BUN: 27 mg/dL — ABNORMAL HIGH (ref 6–23)
CALCIUM: 10 mg/dL (ref 8.4–10.5)
CHLORIDE: 104 meq/L (ref 96–112)
CO2: 30 mEq/L (ref 19–32)
CREATININE: 2.16 mg/dL — AB (ref 0.40–1.50)
GFR: 31.27 mL/min — ABNORMAL LOW (ref >60.00)
Glucose, Bld: 143 mg/dL — ABNORMAL HIGH (ref 70–99)
Potassium: 4.2 mEq/L (ref 3.5–5.1)
SODIUM: 140 meq/L (ref 135–145)

## 2016-02-23 ENCOUNTER — Encounter: Payer: Self-pay | Admitting: Cardiology

## 2016-02-23 ENCOUNTER — Ambulatory Visit (INDEPENDENT_AMBULATORY_CARE_PROVIDER_SITE_OTHER): Payer: Medicare Other | Admitting: Cardiology

## 2016-02-23 VITALS — BP 110/80 | HR 64 | Ht 69.0 in | Wt 181.0 lb

## 2016-02-23 DIAGNOSIS — I214 Non-ST elevation (NSTEMI) myocardial infarction: Secondary | ICD-10-CM | POA: Diagnosis not present

## 2016-02-23 DIAGNOSIS — Z8719 Personal history of other diseases of the digestive system: Secondary | ICD-10-CM | POA: Diagnosis not present

## 2016-02-23 DIAGNOSIS — Z951 Presence of aortocoronary bypass graft: Secondary | ICD-10-CM

## 2016-02-23 DIAGNOSIS — I208 Other forms of angina pectoris: Secondary | ICD-10-CM

## 2016-02-23 NOTE — Patient Instructions (Signed)
Your physician recommends that you schedule a follow-up appointment in: 6 weeks with Dr Gwenlyn Found.

## 2016-02-23 NOTE — Progress Notes (Signed)
02/23/2016 [name]   [birth year]  [id number]  Primary Physician [name], MD Primary Cardiologist: Dr Gwenlyn Found  HPI:  82 year old male with past medical history of severe diverticulosis, multiple GI bleeds since 1981, carotid artery disease s/p R CEA by [name] '94, HTN, HLD, DM, CKD stage IV, CAD- s/p CABG 1996 with redo 2006, SVG-OM DES Aug 2015, NSTEMI Jan 2017 by Troponin and echo, and h/o PAF- not on coumadin due to recurrent GI bleeds. He presented 01/03/16 with recurrent GI bleed. He underwent Lt hemicolectomy [id number] complicated by by peri-op NSTEMI (Troponin peak-16 on 01/16/16). Plan is for medical Rx. Echo done before discharge showed EF 55-60% with no WMA. He  Went to rehab but got out two weeks early as he was doing so well. He is in the office today for follow up. Since discharge he continues to do well. He denies chest pain. He admits to some early fatigue with exertion.     Current Outpatient Prescriptions  Medication Sig Dispense Refill  . aspirin 81 MG tablet Take 81 mg by mouth daily.    . BD INSULIN SYRINGE ULTRAFINE 31G X 5/16" 0.3 ML MISC USE DAILY AS INSTRUCTED 100 each 2  . Cholecalciferol (VITAMIN D) 1000 UNITS capsule Take 1,000 Units by mouth daily.     . colchicine 0.6 MG tablet Take 1 tablet (0.6 mg total) by mouth daily as needed (gout flare up).    . fish oil-omega-3 fatty acids 1000 MG capsule Take 1 g by mouth daily.     . furosemide (LASIX) 80 MG tablet Take 80 mg by mouth daily.    . insulin glargine (LANTUS) 100 UNIT/ML injection Inject 0.15 mLs (15 Units total) into the skin at bedtime. 10 mL   . isosorbide mononitrate (IMDUR) 60 MG 24 hr tablet Take 1 tablet (60 mg total) by mouth 2 (two) times daily with a meal. 120 tablet 2  . magnesium oxide (MAG-OX) 400 MG tablet Take 400 mg by mouth daily.     . metoprolol (LOPRESSOR) 100 MG tablet Take 0.5 tablets (50 mg total) by mouth 2 (two) times daily.    . Multiple Vitamin (MULTIVITAMIN WITH  MINERALS) TABS Take 1 tablet by mouth daily.    . nitroGLYCERIN (NITROSTAT) 0.4 MG SL tablet Place 1 tablet (0.4 mg total) under the tongue every 5 (five) minutes as needed for chest pain. 100 tablet 1  . Potassium Gluconate (K-99) 595 MG CAPS Take 595 mg by mouth daily.     . ranolazine (RANEXA) 500 MG 12 hr tablet Take 1 tablet (500 mg total) by mouth 2 (two) times daily. 90 tablet 3  . rosuvastatin (CRESTOR) 20 MG tablet Take 1 tablet (20 mg total) by mouth daily. 90 tablet 1  . traMADol (ULTRAM) 50 MG tablet Take 1-2 tablets (50-100 mg total) by mouth every 12 (twelve) hours as needed. 360 tablet 1  . Triamcinolone Acetonide (TRIAMCINOLONE 0.1 % CREAM : EUCERIN) CREA Apply 1 application topically 2 (two) times daily.     No current facility-administered medications for this visit.    No Known Allergies  Social History   Social History  . Marital Status: Widowed    Spouse Name: N/A  . Number of Children: 2  . Years of Education: N/A   Occupational History  . 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 Smoking status: Former Smoker -- 1.00 packs/day for 8 years    Types: Cigarettes  .  Smokeless tobacco: Never Used     Comment: "quit smoking cigarettes in 1958"  . Alcohol Use: No  . Drug Use: No  . Sexual Activity: Yes   Other Topics Concern  . Not on file   Social History Narrative   From [place].  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  Widowed 12/2005.     Review of Systems: General: negative for chills, fever, night sweats or weight changes.  Cardiovascular: negative for chest pain, dyspnea on exertion, edema, orthopnea, palpitations, paroxysmal nocturnal dyspnea or shortness of breath Dermatological: negative for rash Respiratory: negative for cough or wheezing Urologic: negative for hematuria Abdominal: negative for nausea, vomiting, diarrhea, bright red blood per rectum,  melena, or hematemesis Neurologic: negative for visual changes, syncope, or dizziness All other systems reviewed and are otherwise negative except as noted above.    Blood pressure 110/80, pulse 64, height 5\' 9"  (1.753 m), weight 181 lb (82.101 kg).  General appearance: alert, cooperative, no distress and moderately obese Lungs: clear to auscultation bilaterally Heart: regular rate and rhythm Extremities: no edema Neurologic: Grossly normal   ASSESSMENT AND PLAN:   NSTEMI (non-ST elevated myocardial infarction) ([place]) Post Op NSTEMI- continue medical Rx  History of GI diverticular bleed S/P sigmoid colectomy 01/13/16  Hx of CABG CABG in 1995,  redo CABG 2006, SVG-OM DES Aug 2015 with early ISR (Plavix non responder), NSTEMI by Troponin and echo Jan 2017- medical Rx, NSTEMI March 2017 s/p colectomy- medical Rx.    PLAN  Same Rx for now. F/U with Dr Gwenlyn Found in 6 weeks- ? should we now consider anticoagulation/ anti platelet therapy since he has had a colectomy.  Kerin Ransom K PA-C 02/23/2016 4:07 PM

## 2016-02-23 NOTE — Assessment & Plan Note (Signed)
CABG in 1995,  redo CABG 2006, SVG-OM DES Aug 2015 with early ISR (Plavix non responder), NSTEMI by Troponin and echo Jan 2017- medical Rx, NSTEMI March 2017 s/p colectomy- medical Rx.

## 2016-02-23 NOTE — Assessment & Plan Note (Signed)
Post Op NSTEMI- continue medical Rx

## 2016-02-23 NOTE — Assessment & Plan Note (Signed)
S/P sigmoid colectomy 01/13/16

## 2016-02-25 ENCOUNTER — Other Ambulatory Visit: Payer: Self-pay | Admitting: Family Medicine

## 2016-02-25 DIAGNOSIS — N183 Chronic kidney disease, stage 3 unspecified: Secondary | ICD-10-CM

## 2016-02-29 ENCOUNTER — Other Ambulatory Visit: Payer: Medicare Other

## 2016-03-06 ENCOUNTER — Other Ambulatory Visit: Payer: Self-pay | Admitting: Family Medicine

## 2016-03-06 DIAGNOSIS — E1142 Type 2 diabetes mellitus with diabetic polyneuropathy: Secondary | ICD-10-CM

## 2016-03-07 ENCOUNTER — Ambulatory Visit (INDEPENDENT_AMBULATORY_CARE_PROVIDER_SITE_OTHER): Payer: Medicare Other

## 2016-03-07 VITALS — BP 110/68 | HR 69 | Temp 98.5°F | Ht 67.0 in | Wt 180.0 lb

## 2016-03-07 DIAGNOSIS — E1142 Type 2 diabetes mellitus with diabetic polyneuropathy: Secondary | ICD-10-CM | POA: Diagnosis not present

## 2016-03-07 DIAGNOSIS — N183 Chronic kidney disease, stage 3 unspecified: Secondary | ICD-10-CM

## 2016-03-07 DIAGNOSIS — D62 Acute posthemorrhagic anemia: Secondary | ICD-10-CM | POA: Diagnosis not present

## 2016-03-07 DIAGNOSIS — Z0000 Encounter for general adult medical examination without abnormal findings: Secondary | ICD-10-CM | POA: Diagnosis not present

## 2016-03-07 NOTE — Patient Instructions (Addendum)
[name] , Thank you for taking time to come for your Medicare Wellness Visit. I appreciate your ongoing commitment to your health goals. Please review the following plan we discussed and let me know if I can assist you in the future.   These are the goals we discussed: Goals    . 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        This is a list of the screening recommended for you and due dates:  Health Maintenance  Topic Date Due  . Eye exam for diabetics  04/05/2016*  . Complete foot exam   04/12/2016*  . Flu Shot  06/14/2016  . Hemoglobin A1C  07/02/2016  . Urine Protein Check  03/07/2017  . Tetanus Vaccine  03/17/2019  . Shingles Vaccine  Completed  . Pneumonia vaccines  Completed  *Topic was postponed. The date shown is not the original due date.    Preventive Care for Adults  A healthy lifestyle and preventive care can promote health and wellness. Preventive health guidelines for adults include the following key practices.  . A routine yearly physical is a good way to check with your health care provider about your health and preventive screening. It is a chance to share any concerns and updates on your health and to receive a thorough exam.  . Visit your dentist for a routine exam and preventive care every 6 months. Brush your teeth twice a day and floss once a day. Good oral hygiene prevents tooth decay and gum disease.  . The frequency of eye exams is based on your age, health, family medical history, use  of contact lenses, and other factors. Follow your health care provider's ecommendations for frequency of eye exams.  . Eat a healthy diet. Foods like vegetables, fruits, whole grains, low-fat dairy products, and lean protein foods contain the nutrients you need without too many calories. Decrease your intake of foods high in solid fats, added sugars, and salt. Eat the  right amount of calories for you. Get information about a proper diet from your health care provider, if necessary.  . Regular physical exercise is one of the most important things you can do for your health. Most adults should get at least 150 minutes of moderate-intensity exercise (any activity that increases your heart rate and causes you to sweat) each week. In addition, most adults need muscle-strengthening exercises on 2 or more days a week.  Silver Sneakers may be a benefit available to you. To determine eligibility, you may visit the website: www.silversneakers.com or contact program at 209-656-3289 Mon-Fri between 8AM-8PM.   . Maintain a healthy weight. The body mass index (BMI) is a screening tool to identify possible weight problems. It provides an estimate of body fat based on height and weight. Your health care provider can find your BMI and can help you achieve or maintain a healthy weight.   For adults 20 years and older: ? A BMI below 18.5 is considered underweight. ? A BMI of 18.5 to 24.9 is normal. ? A BMI of 25 to 29.9 is considered overweight. ? A BMI of 30 and above is considered obese.   . Maintain normal blood lipids and cholesterol levels by exercising and minimizing your intake of saturated fat. Eat a balanced diet with plenty of fruit and vegetables. Blood tests for lipids and cholesterol should begin at age 50 and be  repeated every 5 years. If your lipid or cholesterol levels are high, you are over 50, or you are at high risk for heart disease, you may need your cholesterol levels checked more frequently. Ongoing high lipid and cholesterol levels should be treated with medicines if diet and exercise are not working.  . If you smoke, find out from your health care provider how to quit. If you do not use tobacco, please do not start.  . If you choose to drink alcohol, please do not consume more than 2 drinks per day. One drink is considered to be 12 ounces (355 mL) of  beer, 5 ounces (148 mL) of wine, or 1.5 ounces (44 mL) of liquor.  . If you are 29-83 years old, ask your health care provider if you should take aspirin to prevent strokes.  . Use sunscreen. Apply sunscreen liberally and repeatedly throughout the day. You should seek shade when your shadow is shorter than you. Protect yourself by wearing long sleeves, pants, a wide-brimmed hat, and sunglasses year round, whenever you are outdoors.  . Once a month, do a whole body skin exam, using a mirror to look at the skin on your back. Tell your health care provider of new moles, moles that have irregular borders, moles that are larger than a pencil eraser, or moles that have changed in shape or color.     Fall Prevention in the Home  Falls can cause injuries. They can happen to people of all ages. There are many things you can do to make your home safe and to help prevent falls.  WHAT CAN I DO ON THE OUTSIDE OF MY HOME?  Regularly fix the edges of walkways and driveways and fix any cracks.  Remove anything that might make you trip as you walk through a door, such as a raised step or threshold.  Trim any bushes or trees on the path to your home.  Use bright outdoor lighting.  Clear any walking paths of anything that might make someone trip, such as rocks or tools.  Regularly check to see if handrails are loose or broken. Make sure that both sides of any steps have handrails.  Any raised decks and porches should have guardrails on the edges.  Have any leaves, snow, or ice cleared regularly.  Use sand or salt on walking paths during winter.  Clean up any spills in your garage right away. This includes oil or grease spills. WHAT CAN I DO IN THE BATHROOM?   Use night lights.  Install grab bars by the toilet and in the tub and shower. Do not use towel bars as grab bars.  Use non-skid mats or decals in the tub or shower.  If you need to sit down in the shower, use a plastic, non-slip  stool.  Keep the floor dry. Clean up any water that spills on the floor as soon as it happens.  Remove soap buildup in the tub or shower regularly.  Attach bath mats securely with double-sided non-slip rug tape.  Do not have throw rugs and other things on the floor that can make you trip. WHAT CAN I DO IN THE BEDROOM?  Use night lights.  Make sure that you have a light by your bed that is easy to reach.  Do not use any sheets or blankets that are too big for your bed. They should not hang down onto the floor.  Have a firm chair that has side arms. You can use this  for support while you get dressed.  Do not have throw rugs and other things on the floor that can make you trip. WHAT CAN I DO IN THE KITCHEN?  Clean up any spills right away.  Avoid walking on wet floors.  Keep items that you use a lot in easy-to-reach places.  If you need to reach something above you, use a strong step stool that has a grab bar.  Keep electrical cords out of the way.  Do not use floor polish or wax that makes floors slippery. If you must use wax, use non-skid floor wax.  Do not have throw rugs and other things on the floor that can make you trip. WHAT CAN I DO WITH MY STAIRS?  Do not leave any items on the stairs.  Make sure that there are handrails on both sides of the stairs and use them. Fix handrails that are broken or loose. Make sure that handrails are as long as the stairways.  Check any carpeting to make sure that it is firmly attached to the stairs. Fix any carpet that is loose or worn.  Avoid having throw rugs at the top or bottom of the stairs. If you do have throw rugs, attach them to the floor with carpet tape.  Make sure that you have a light switch at the top of the stairs and the bottom of the stairs. If you do not have them, ask someone to add them for you. WHAT ELSE CAN I DO TO HELP PREVENT FALLS?  Wear shoes that:  Do not have high heels.  Have rubber bottoms.  Are  comfortable and fit you well.  Are closed at the toe. Do not wear sandals.  If you use a stepladder:  Make sure that it is fully opened. Do not climb a closed stepladder.  Make sure that both sides of the stepladder are locked into place.  Ask someone to hold it for you, if possible.  Clearly mark and make sure that you can see:  Any grab bars or handrails.  First and last steps.  Where the edge of each step is.  Use tools that help you move around (mobility aids) if they are needed. These include:  Canes.  Walkers.  Scooters.  Crutches.  Turn on the lights when you go into a dark area. Replace any light bulbs as soon as they burn out.  Set up your furniture so you have a clear path. Avoid moving your furniture around.  If any of your floors are uneven, fix them.  If there are any pets around you, be aware of where they are.  Review your medicines with your doctor. Some medicines can make you feel dizzy. This can increase your chance of falling. Ask your doctor what other things that you can do to help prevent falls.   This information is not intended to replace advice given to you by your health care provider. Make sure you discuss any questions you have with your health care provider.   Document Released: 08/27/2009 Document Revised: 03/17/2015 Document Reviewed: 12/05/2014 Elsevier Interactive Patient Education [employer].

## 2016-03-07 NOTE — Progress Notes (Signed)
Pre visit review using our clinic review tool, if applicable. No additional management support is needed unless otherwise documented below in the visit note. 

## 2016-03-07 NOTE — Progress Notes (Signed)
Subjective:   [name] is a 82 y.o. male who presents for Medicare Annual/Subsequent preventive examination.   Cardiac Risk Factors include: advanced age (>8men, >69 women);male gender;diabetes mellitus     Objective:    Vitals: BP 110/68 mmHg  Pulse 69  Temp(Src) 98.5 F (36.9 C) (Oral)  Ht 5\' 7"  (1.702 m)  Wt 180 lb (81.647 kg)  BMI 28.19 kg/m2  SpO2 98%  Body mass index is 28.19 kg/(m^2).  Tobacco History  Smoking status  . Former Smoker -- 1.00 packs/day for 8 years  . Types: Cigarettes  Smokeless tobacco  . Never Used    Comment: "quit smoking cigarettes in 1958"     Counseling given: No   Past Medical History  Diagnosis Date  . Anemia     Secondary to acute blood loss  . Hypertension   . Hyperlipidemia   . RBBB (right bundle branch block)   . Age-related macular degeneration, wet, both eyes ([place])   . Carotid artery disease ([place])     Doppler 09/18/2009 - 49% bilateral stenoses  . Personal history of colonic polyps   . Diverticulosis of colon with hemorrhage 2009    several unit diverticular bleed   . Erectile dysfunction     Mild  . Atrial flutter [facility]) 07/2010    September, 2011   Hospital with PNA and cath done.Marland KitchenMarland KitchenCoumadin.  Atrial flutter ablation planned, but  pt. then had atrial fibrillation,/outpatient conversion 09/08/10..NSR..plan to follow..Dr. Caryl Comes  . Gout   . Skin cancer     R lower leg, per derm 2012  . Atrial fibrillation ([place])     Consideration was given for atrial flutter ablation, but patient developed atrial fibrillation. Cardioversion was done. Dr. Caryl Comes decided to watch him clinically. November, 2011  . Carotid artery disease ([name])     49% bilateral, Doppler, November, 2010  . CAD (coronary artery disease)     Catheterization, September, 2011,  grafts patent from redo CABG,, medical therapy of coronary disease, consideration to proceeding with atrial flutter ablation  . Ejection fraction     EF 60%, echo, 2009  //   EF 65%,  echo, September, 2011  . Mitral regurgitation     Mild, echo, September, 2011  . Shoulder pain     "positional; better now" (11/13/2015)  . Anginal pain ([place])   . PNA (pneumonia) 9/11    NSTEMI at [facility] with repeat cath, rec medical mgmt   . NSTEMI (non-ST elevated myocardial infarction) ([place]) 07/2010    at [facility] with repeat cath, rec medical mgmt   . OSA on CPAP   . Type II diabetes mellitus ([place])   . Diabetic peripheral neuropathy ([place])   . History of blood transfusion "several"    "related to diverticular bleeding"  . Arthritis     "mild in hands, knees, ankles" (11/13/2015)  . Chronic kidney disease (CKD), stage III (moderate)    Past Surgical History  Procedure Laterality Date  . Cardiac catheterization  2006    Nuclear..slight lateral ischemia..medical therapy  . Cardiac catheterization  08/04/2010    grafts patent from redo CABG...medical Rx and ablate Atrial flutter (LV not injected)   . Doppler echocardiography  08/2008    EF 60%  . Doppler echocardiography  08/02/2010    65-70%  . Doppler echocardiography  07/2010    MR mild  . Coronary artery bypass graft  1995; 2006    "X 3; X3"  . Colonoscopy w/ polypectomy    .  Cataract extraction w/ intraocular lens  implant, bilateral Bilateral   . Vasectomy    . Tonsillectomy    . Carotid endarterectomy Right 1994  . Coronary artery bypass graft  1995, 2006  . Esophagogastroduodenoscopy N/A 06/19/2014    Procedure: ESOPHAGOGASTRODUODENOSCOPY (EGD);  Surgeon: Jerene Bears, MD;  Location: [facility] ENDOSCOPY;  Service: Endoscopy;  Laterality: N/A;  . Left heart catheterization with coronary/graft angiogram N/A 06/11/2014    Procedure: LEFT HEART CATHETERIZATION WITH Beatrix Fetters;  Surgeon: Sinclair Grooms, MD;  Location: [facility] CATH LAB;  Service: Cardiovascular;  Laterality: N/A;  . Percutaneous coronary stent intervention (pci-s) N/A 06/13/2014    Procedure: PERCUTANEOUS CORONARY STENT INTERVENTION (PCI-S);  Surgeon: Sinclair Grooms, MD;  Location: [facility] CATH LAB;  Service: Cardiovascular;  Laterality: N/A;  . Left heart cath N/A 06/14/2014    Procedure: LEFT HEART CATH;  Surgeon: Sinclair Grooms, MD;  Location: [facility] CATH LAB;  Service: Cardiovascular;  Laterality: N/A;  . Laparoscopic cholecystectomy  2008  . Skin cancer excision Left 10/2015    calf  . Skin cancer excision Right 2014?    chest  . Cardioversion  ~ 2010  . Colonoscopy N/A 11/14/2015    Procedure: COLONOSCOPY;  Surgeon: Manus Gunning, MD;  Location: [place];  Service: Gastroenterology;  Laterality: N/A;  . Esophagogastroduodenoscopy N/A 11/14/2015    Procedure: ESOPHAGOGASTRODUODENOSCOPY (EGD);  Surgeon: Manus Gunning, MD;  Location: [place];  Service: Gastroenterology;  Laterality: N/A;  . Flexible sigmoidoscopy N/A 11/16/2015    Procedure: FLEXIBLE SIGMOIDOSCOPY;  Surgeon: Manus Gunning, MD;  Location: [place];  Service: Gastroenterology;  Laterality: N/A;  . Colonoscopy Left 01/05/2016    Procedure: COLONOSCOPY;  Surgeon: Manus Gunning, MD;  Location: [place];  Service: Gastroenterology;  Laterality: Left;  no sedation to start, moderate if needed  . Colon resection N/A 01/13/2016    Procedure: EXPLORATORY LAPAROTOMY, LEFT AND SIGMOID COLON REMOVAL;  Surgeon: Ralene Ok, MD;  Location: [place];  Service: General;  Laterality: N/A;  Extended open left hemicolectomy and sigmoidectomy    Family History  Problem Relation Age of Onset  . Kidney disease Mother     Kidney failure  . Stroke Mother   . Diabetes Mother   . Heart disease Father     MI  . Arthritis Sister   . Cancer Sister     Throat  . Heart disease Sister     MI  . Diabetes Sister   . Prostate cancer Neg Hx   . Colon cancer Neg Hx    History  Sexual Activity  . Sexual Activity: Yes    Outpatient Encounter Prescriptions as of 03/07/2016  Medication Sig  . aspirin 81 MG tablet Take 81 mg by mouth daily.  . BD INSULIN SYRINGE  ULTRAFINE 31G X 5/16" 0.3 ML MISC USE DAILY AS INSTRUCTED  . Cholecalciferol (VITAMIN D) 1000 UNITS capsule Take 1,000 Units by mouth daily.   . colchicine 0.6 MG tablet Take 1 tablet (0.6 mg total) by mouth daily as needed (gout flare up).  . fish oil-omega-3 fatty acids 1000 MG capsule Take 1 g by mouth daily.   . furosemide (LASIX) 80 MG tablet Take 80 mg by mouth daily.  . insulin glargine (LANTUS) 100 UNIT/ML injection Inject 0.15 mLs (15 Units total) into the skin at bedtime.  . isosorbide mononitrate (IMDUR) 60 MG 24 hr tablet Take 1 tablet (60 mg total) by mouth 2 (two) times daily with  a meal.  . magnesium oxide (MAG-OX) 400 MG tablet Take 400 mg by mouth daily.   . metoprolol (LOPRESSOR) 100 MG tablet Take 0.5 tablets (50 mg total) by mouth 2 (two) times daily.  . Multiple Vitamin (MULTIVITAMIN WITH MINERALS) TABS Take 1 tablet by mouth daily.  . nitroGLYCERIN (NITROSTAT) 0.4 MG SL tablet Place 1 tablet (0.4 mg total) under the tongue every 5 (five) minutes as needed for chest pain.  [name] Potassium Gluconate (K-99) 595 MG CAPS Take 595 mg by mouth daily.   . ranolazine (RANEXA) 500 MG 12 hr tablet Take 1 tablet (500 mg total) by mouth 2 (two) times daily.  . rosuvastatin (CRESTOR) 20 MG tablet Take 1 tablet (20 mg total) by mouth daily.  . traMADol (ULTRAM) 50 MG tablet Take 1-2 tablets (50-100 mg total) by mouth every 12 (twelve) hours as needed.  . Triamcinolone Acetonide (TRIAMCINOLONE 0.1 % CREAM : EUCERIN) CREA Apply 1 application topically 2 (two) times daily.   No facility-administered encounter medications on file as of 03/07/2016.    Activities of Daily Living In your present state of health, do you have any difficulty performing the following activities: 03/07/2016 01/03/2016  Hearing? Y N  Vision? N N  Difficulty concentrating or making decisions? Y N  Walking or climbing stairs? N N  Dressing or bathing? N N  Doing errands, shopping? N N  Preparing Food and eating ? N -    Using the Toilet? N -  In the past six months, have you accidently leaked urine? N -  Do you have problems with loss of bowel control? N -  Managing your Medications? N -  Managing your Finances? N -  Housekeeping or managing your Housekeeping? 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       Fall Risk Fall Risk  03/07/2016 09/08/2014 03/12/2013  Falls in the past year? Yes Yes Yes  Number falls in past yr: 1 1 1   Injury with Fall? No - -  Risk for fall due to : - Other (Comment) -  Follow up Falls evaluation completed;Education provided - -   Depression Screen PHQ 2/9 Scores 03/07/2016 09/08/2014 03/12/2013  PHQ - 2 Score 0 0 0    Cognitive Testing MMSE - Mini Mental State Exam 03/07/2016  Orientation to time 5  Orientation to Place 5  Registration 3  Attention/ Calculation 0  Recall 3  Language- name 2 objects 0  Language- repeat 1  Language- follow 3 step command 3  Language- read & follow direction 0  Write a sentence 0  Copy design 0  Total score 20   PLEASE NOTE: A Mini-Cog screen was completed. Maximum score is 20. A value of 0 denotes this part of [place] MMSE was not completed.  Orientation to Time - Max 5 Orientation  to Place - Max 5 Registration - Max 3 Recall - Max 3 Language Repeat - Max  1 Language Follow 3 Step Command - Max 3  Immunization History  Administered Date(s) Administered  . Influenza Split 09/05/2011, 09/24/2012, 07/17/2014  . Influenza Whole 09/14/2006, 09/12/2007  . Influenza,inj,Quad PF,36+ Mos 09/19/2013, 08/31/2015  . Pneumococcal Conjugate-13 04/28/2015  . Pneumococcal Polysaccharide-23 09/14/2002  . Td 12/10/1998, 03/16/2009  . Zoster 10/04/2007   Screening Tests Health Maintenance  Topic Date Due  . OPHTHALMOLOGY EXAM  04/05/2016 (Originally 11/20/2015)  . FOOT EXAM  04/12/2016 (Originally 12/27/2015)  . INFLUENZA VACCINE  06/14/2016  . HEMOGLOBIN A1C  07/02/2016  . URINE MICROALBUMIN  03/07/2017  . TETANUS/TDAP  03/17/2019  . ZOSTAVAX  Completed  . PNA vac Low Risk Adult  Completed      Plan:     I have personally reviewed and addressed the Medicare Annual Wellness questionnaire and have noted the following in the patient's chart:  A. Medical and social history B. Use of alcohol, tobacco or illicit drugs  C. Current medications and supplements D. Functional ability and status E.  Nutritional status F.  Physical activity G. Advance directives H. List of other physicians I.  Hospitalizations, surgeries, and ER visits in previous 12 months J.  [place] to include hearing, vision, cognitive, depression L. Referrals and appointments - none  In addition, I have reviewed and discussed with patient certain preventive protocols, quality metrics, and best practice recommendations. A written personalized care plan for preventive services as well as general preventive health recommendations were provided to patient.  See attached scanned questionnaire for additional information.   Signed,   Lindell Noe, MHA, BS, LPN Health Advisor [phone]  I reviewed health advisor's note, was available for consultation on the day of service listed in this note, and agree with documentation and plan. [name], MD.

## 2016-03-08 LAB — CBC WITH DIFFERENTIAL/PLATELET
BASOS ABS: 0 10*3/uL (ref 0.0–0.1)
BASOS PCT: 0.5 % (ref 0.0–3.0)
Eosinophils Absolute: 0.4 10*3/uL (ref 0.0–0.7)
Eosinophils Relative: 6.7 % — ABNORMAL HIGH (ref 0.0–5.0)
HEMATOCRIT: 32.1 % — AB (ref 39.0–52.0)
Hemoglobin: 10.5 g/dL — ABNORMAL LOW (ref 13.0–17.0)
LYMPHS PCT: 26.5 % (ref 12.0–46.0)
Lymphs Abs: 1.5 10*3/uL (ref 0.7–4.0)
MCHC: 32.7 g/dL (ref 30.0–36.0)
MCV: 87.2 fl (ref 78.0–100.0)
Monocytes Absolute: 0.6 10*3/uL (ref 0.1–1.0)
Monocytes Relative: 9.9 % (ref 3.0–12.0)
NEUTROS ABS: 3.3 10*3/uL (ref 1.4–7.7)
Neutrophils Relative %: 56.4 % (ref 43.0–77.0)
PLATELETS: 176 10*3/uL (ref 150.0–400.0)
RBC: 3.68 Mil/uL — ABNORMAL LOW (ref 4.22–5.81)
RDW: 17.7 % — AB (ref 11.5–15.5)
WBC: 5.8 10*3/uL (ref 4.0–10.5)

## 2016-03-08 LAB — MICROALBUMIN / CREATININE URINE RATIO
Creatinine,U: 34.9 mg/dL
Microalb Creat Ratio: 2 mg/g (ref 0.0–30.0)

## 2016-03-08 LAB — HEMOGLOBIN A1C: Hgb A1c MFr Bld: 6.7 % — ABNORMAL HIGH (ref 4.6–6.5)

## 2016-03-08 LAB — BASIC METABOLIC PANEL
BUN: 27 mg/dL — AB (ref 6–23)
CHLORIDE: 102 meq/L (ref 96–112)
CO2: 31 mEq/L (ref 19–32)
CREATININE: 1.91 mg/dL — AB (ref 0.40–1.50)
Calcium: 9.7 mg/dL (ref 8.4–10.5)
GFR: 36.03 mL/min — AB (ref >60.00)
Glucose, Bld: 177 mg/dL — ABNORMAL HIGH (ref 70–99)
Potassium: 3.9 mEq/L (ref 3.5–5.1)
Sodium: 140 mEq/L (ref 135–145)

## 2016-03-24 ENCOUNTER — Other Ambulatory Visit: Payer: Self-pay | Admitting: Cardiology

## 2016-03-24 NOTE — Telephone Encounter (Signed)
Rx refill sent to pharmacy. 

## 2016-03-28 ENCOUNTER — Other Ambulatory Visit: Payer: Medicare Other

## 2016-03-31 ENCOUNTER — Encounter: Payer: Self-pay | Admitting: Family Medicine

## 2016-03-31 ENCOUNTER — Ambulatory Visit (INDEPENDENT_AMBULATORY_CARE_PROVIDER_SITE_OTHER): Payer: Medicare Other | Admitting: Family Medicine

## 2016-03-31 VITALS — BP 116/58 | HR 81 | Temp 98.5°F | Wt 184.2 lb

## 2016-03-31 DIAGNOSIS — D649 Anemia, unspecified: Secondary | ICD-10-CM

## 2016-03-31 DIAGNOSIS — I208 Other forms of angina pectoris: Secondary | ICD-10-CM

## 2016-03-31 DIAGNOSIS — R531 Weakness: Secondary | ICD-10-CM | POA: Diagnosis not present

## 2016-03-31 LAB — CBC WITH DIFFERENTIAL/PLATELET
BASOS ABS: 0 10*3/uL (ref 0.0–0.1)
Basophils Relative: 0.4 % (ref 0.0–3.0)
EOS ABS: 0.4 10*3/uL (ref 0.0–0.7)
Eosinophils Relative: 6.2 % — ABNORMAL HIGH (ref 0.0–5.0)
HCT: 32.5 % — ABNORMAL LOW (ref 39.0–52.0)
Hemoglobin: 10.7 g/dL — ABNORMAL LOW (ref 13.0–17.0)
LYMPHS ABS: 1.5 10*3/uL (ref 0.7–4.0)
Lymphocytes Relative: 24.2 % (ref 12.0–46.0)
MCHC: 32.9 g/dL (ref 30.0–36.0)
MCV: 86.9 fl (ref 78.0–100.0)
Monocytes Absolute: 0.6 10*3/uL (ref 0.1–1.0)
Monocytes Relative: 9.5 % (ref 3.0–12.0)
NEUTROS ABS: 3.7 10*3/uL (ref 1.4–7.7)
NEUTROS PCT: 59.7 % (ref 43.0–77.0)
PLATELETS: 159 10*3/uL (ref 150.0–400.0)
RBC: 3.74 Mil/uL — ABNORMAL LOW (ref 4.22–5.81)
RDW: 18.7 % — ABNORMAL HIGH (ref 11.5–15.5)
WBC: 6.3 10*3/uL (ref 4.0–10.5)

## 2016-03-31 NOTE — Patient Instructions (Addendum)
Check your med list at home.  If you have been taking 100mg  of metoprolol twice a day, then cut back to 50mg  twice a day.  Go to the lab on the way out.  We'll contact you with your lab report. I'll await the notes from your other docs.   Take care.  Glad to see you.

## 2016-03-31 NOTE — Progress Notes (Signed)
Pre visit review using our clinic review tool, if applicable. No additional management support is needed unless otherwise documented below in the visit note.  He still has some fatigue and "feeling worn down", he has lower than expected exercise tolerance (taking the garbage can to the street) but not CP.  D/w pt.  Some of this is expected.  Due for f/u CBC today re: anemia.  D/w pt about BB dose.  No bleeding.  No chest pain.  He has f/u with cards next week.  He has been on 100mg  metoprolol BID, instead of 50mg  BID.  D/w pt.   No blood in stool. No dark stools.  Prev bed sore/hot spot has resolved.    He had f/u with renal clinic next week.    Meds, vitals, and allergies reviewed.   ROS: Per HPI unless specifically indicated in ROS section   GEN: nad, alert and oriented HEENT: mucous membranes moist NECK: supple w/o LA CV: rrr PULM: ctab, no inc wob ABD: soft, +bs EXT: trace BLE edema

## 2016-03-31 NOTE — Assessment & Plan Note (Signed)
He was on a higher dose of BB and that may have affected his sx.  Recheck CBC, see notes on labs.  Still okay for outpatient f/u.  I'll await cards and renal input in the meantime.  He agrees.

## 2016-04-05 DIAGNOSIS — H353123 Nonexudative age-related macular degeneration, left eye, advanced atrophic without subfoveal involvement: Secondary | ICD-10-CM | POA: Diagnosis not present

## 2016-04-05 DIAGNOSIS — H353112 Nonexudative age-related macular degeneration, right eye, intermediate dry stage: Secondary | ICD-10-CM | POA: Diagnosis not present

## 2016-04-05 DIAGNOSIS — H33309 Unspecified retinal break, unspecified eye: Secondary | ICD-10-CM | POA: Diagnosis not present

## 2016-04-05 DIAGNOSIS — H4423 Degenerative myopia, bilateral: Secondary | ICD-10-CM | POA: Diagnosis not present

## 2016-04-06 ENCOUNTER — Other Ambulatory Visit: Payer: Self-pay | Admitting: Family Medicine

## 2016-04-06 DIAGNOSIS — N183 Chronic kidney disease, stage 3 (moderate): Secondary | ICD-10-CM | POA: Diagnosis not present

## 2016-04-06 DIAGNOSIS — E889 Metabolic disorder, unspecified: Secondary | ICD-10-CM | POA: Diagnosis not present

## 2016-04-06 DIAGNOSIS — N189 Chronic kidney disease, unspecified: Secondary | ICD-10-CM | POA: Diagnosis not present

## 2016-04-12 ENCOUNTER — Ambulatory Visit (INDEPENDENT_AMBULATORY_CARE_PROVIDER_SITE_OTHER): Payer: Medicare Other | Admitting: Cardiovascular Disease

## 2016-04-12 ENCOUNTER — Encounter: Payer: Self-pay | Admitting: Cardiovascular Disease

## 2016-04-12 VITALS — BP 148/62 | HR 78 | Ht 67.0 in | Wt 182.6 lb

## 2016-04-12 DIAGNOSIS — I779 Disorder of arteries and arterioles, unspecified: Secondary | ICD-10-CM | POA: Diagnosis not present

## 2016-04-12 DIAGNOSIS — I5189 Other ill-defined heart diseases: Secondary | ICD-10-CM

## 2016-04-12 DIAGNOSIS — I208 Other forms of angina pectoris: Secondary | ICD-10-CM

## 2016-04-12 DIAGNOSIS — Z951 Presence of aortocoronary bypass graft: Secondary | ICD-10-CM | POA: Diagnosis not present

## 2016-04-12 DIAGNOSIS — I739 Peripheral vascular disease, unspecified: Principal | ICD-10-CM

## 2016-04-12 DIAGNOSIS — I519 Heart disease, unspecified: Secondary | ICD-10-CM | POA: Diagnosis not present

## 2016-04-12 NOTE — Assessment & Plan Note (Signed)
History of hyperlipidemia on statin therapy with recent lipid profile performed 12/19/15 related to cholesterol 120, LDL 70 and HDL of 39.

## 2016-04-12 NOTE — Assessment & Plan Note (Signed)
History of moderate bilateral ICA stenosis by duplex ultrasound 12/02/15 which we follow on a semiannual basis.

## 2016-04-12 NOTE — Patient Instructions (Signed)

## 2016-04-12 NOTE — Assessment & Plan Note (Signed)
History of diastolic heart failure with echo revealing an EF of 50-55% with grade 2 diastolic dysfunction.

## 2016-04-12 NOTE — Assessment & Plan Note (Addendum)
history of CAD status post coronary artery bypass grafting in 1996 and again in 2006 by [name]..he has had several interventions on his obtuse marginal branch vein graft by [name]. He currently denies chest pain but does get fatigued fairly easily.

## 2016-04-12 NOTE — Progress Notes (Signed)
04/12/2016 [name]   07-29-1932  [id number]  Primary Physician Jeremiah Stain, MD Primary Cardiologist: Jeremiah Harp MD [name]   HPI:  [name] is a very pleasant 82 year old engaged Caucasian male formerly a patient of [name]. I last saw him in the office 12/02/15. [name] is the father of one daughter and one son , the grandfather to 2 grandchildren. 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 His past medical history is remarkable for coronary artery bypass grafting in 1996 and again in 2006 by [name]. He had remote right carotid endarterectomy by [name] in 1994. History of hypertension, hyperlipidemia and diabetes. He was hospitalized in late July 2015 because of unstable angina. [name] performed cardiac catheterization revealing a high-grade obtuse marginal branch vein graft stenosis which was stented. Because of recurrent chest pain he was re-intervention relates later and found to have a new hazy lesion just proximal to the previously placed stent which was again intervened on. He was deemed a Plavix nonresponder and was begun on Brilenta. He also has a remote history of PAF as well as diverticulitis and has had GI bleed on Coumadin. His oral anticoagulation was discontinued.  He was recently admitted on 11/13/15 for a GI bleed. He underwent endoscopy revealing gastritis, colonoscopy revealing bleeding diverticula which was treated with clipping. He had 3 units of packed red blood cell transfusion. He did have chest pain and positive enzymes after his endoscopy within the echo that showed an EF of 50% with a new anterior wall motion and reality. He was thought to have "a non-STEMI. His Plavix was discontinued. Because of his moderate renal insufficiency and his inability to take antiplatelet therapy he did not undergo repeat cardiac catheterization. He underwent left-sided hemicolectomy and  sigmoidectomy because of chronic GI bleeding. This was uneventful. His hemoglobin has remained stable He denies chest pain but does get easily fatigued.   Current Outpatient Prescriptions  Medication Sig Dispense Refill  . aspirin 81 MG tablet Take 81 mg by mouth daily.    . BD INSULIN SYRINGE ULTRAFINE 31G X 5/16" 0.3 ML MISC USE DAILY AS INSTRUCTED 100 each 2  . Cholecalciferol (VITAMIN D) 1000 UNITS capsule Take 1,000 Units by mouth daily.     . colchicine 0.6 MG tablet Take 1 tablet (0.6 mg total) by mouth daily as needed (gout flare up).    . ferrous sulfate 325 (65 FE) MG EC tablet Take 325 mg by mouth daily.    . fish oil-omega-3 fatty acids 1000 MG capsule Take 1 g by mouth daily.     . furosemide (LASIX) 80 MG tablet Take 80 mg by mouth daily.    . insulin glargine (LANTUS) 100 UNIT/ML injection Inject 0.15 mLs (15 Units total) into the skin at bedtime. 10 mL   . isosorbide mononitrate (IMDUR) 60 MG 24 hr tablet Take 1 tablet (60 mg total) by mouth 2 (two) times daily with a meal. 120 tablet 2  . LANTUS 100 UNIT/ML injection INJECT 30 UNITS UNDER THE SKIN DAILY AS INSTRUCTED BY DOCTOR 40 mL 0  . magnesium oxide (MAG-OX) 400 MG tablet Take 400 mg by mouth daily.     . metoprolol (LOPRESSOR) 100 MG tablet Take 0.5 tablets (50 mg total) by mouth 2 (two) times daily.    . Multiple Vitamin (MULTIVITAMIN WITH MINERALS) TABS Take 1 tablet by mouth daily.    . nitroGLYCERIN (NITROSTAT) 0.4 MG  SL tablet Place 1 tablet (0.4 mg total) under the tongue every 5 (five) minutes as needed for chest pain. 100 tablet 1  . Potassium Gluconate (K-99) 595 MG CAPS Take 595 mg by mouth daily.     . ranolazine (RANEXA) 500 MG 12 hr tablet Take 1 tablet (500 mg total) by mouth 2 (two) times daily. 90 tablet 3  . rosuvastatin (CRESTOR) 20 MG tablet TAKE 1 TABLET DAILY 90 tablet 0  . traMADol (ULTRAM) 50 MG tablet Take 1-2 tablets (50-100 mg total) by mouth every 12 (twelve) hours as needed. 360 tablet 1  .  Triamcinolone Acetonide (TRIAMCINOLONE 0.1 % CREAM : EUCERIN) CREA Apply 1 application topically 2 (two) times daily.     No current facility-administered medications for this visit.    No Known Allergies  Social History   Social History  . Marital Status: Widowed    Spouse Name: N/A  . Number of Children: 2  . Years of Education: N/A   Occupational History  . 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 Smoking status: Former Smoker -- 1.00 packs/day for 8 years    Types: Cigarettes  . Smokeless tobacco: Never Used     Comment: "quit smoking cigarettes in 1958"  . Alcohol Use: No  . Drug Use: No  . Sexual Activity: Yes   Other Topics Concern  . Not on file   Social History Narrative   From [place].  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  Widowed 12/2005.     Review of Systems: General: negative for chills, fever, night sweats or weight changes.  Cardiovascular: negative for chest pain, dyspnea on exertion, edema, orthopnea, palpitations, paroxysmal nocturnal dyspnea or shortness of breath Dermatological: negative for rash Respiratory: negative for cough or wheezing Urologic: negative for hematuria Abdominal: negative for nausea, vomiting, diarrhea, bright red blood per rectum, melena, or hematemesis Neurologic: negative for visual changes, syncope, or dizziness All other systems reviewed and are otherwise negative except as noted above.    Blood pressure 148/62, pulse 78, height 5\' 7"  (1.702 m), weight 182 lb 9.6 oz (82.827 kg), SpO2 99 %.  General appearance: alert and no distress Neck: no adenopathy, no carotid bruit, no JVD, supple, symmetrical, trachea midline and thyroid not enlarged, symmetric, no tenderness/mass/nodules Lungs: clear to auscultation bilaterally Heart: regular rate and rhythm, S1, S2 normal, no murmur, click, rub or gallop Extremities: extremities normal,  atraumatic, no cyanosis or edema  EKG not performed today  ASSESSMENT AND PLAN:   Hyperlipidemia History of hyperlipidemia on statin therapy with recent lipid profile performed 12/19/15 related to cholesterol 120, LDL 70 and HDL of 39.  Carotid artery disease History of moderate bilateral ICA stenosis by duplex ultrasound 12/02/15 which we follow on a semiannual basis.  Hx of CABG history of CAD status post coronary artery bypass grafting in 1996 and again in 2006 by [name]..he has had several interventions on his obtuse marginal branch vein graft by [name]. He currently denies chest pain but does get fatigued fairly easily.  Diastolic dysfunction-grade 2 with EF 50-55% History of diastolic heart failure with echo revealing an EF of 50-55% with grade 2 diastolic dysfunction.      Jeremiah Harp MD FACP,FACC,FAHA, [place] 04/12/2016 2:30 PM

## 2016-04-14 LAB — HM DIABETES EYE EXAM

## 2016-04-27 DIAGNOSIS — X32XXXD Exposure to sunlight, subsequent encounter: Secondary | ICD-10-CM | POA: Diagnosis not present

## 2016-04-27 DIAGNOSIS — L570 Actinic keratosis: Secondary | ICD-10-CM | POA: Diagnosis not present

## 2016-04-27 DIAGNOSIS — D0339 Melanoma in situ of other parts of face: Secondary | ICD-10-CM | POA: Diagnosis not present

## 2016-05-02 ENCOUNTER — Other Ambulatory Visit: Payer: Self-pay | Admitting: Family Medicine

## 2016-05-06 DIAGNOSIS — D0339 Melanoma in situ of other parts of face: Secondary | ICD-10-CM | POA: Diagnosis not present

## 2016-05-06 DIAGNOSIS — L304 Erythema intertrigo: Secondary | ICD-10-CM | POA: Diagnosis not present

## 2016-05-09 ENCOUNTER — Other Ambulatory Visit (INDEPENDENT_AMBULATORY_CARE_PROVIDER_SITE_OTHER): Payer: Medicare Other

## 2016-05-09 DIAGNOSIS — D649 Anemia, unspecified: Secondary | ICD-10-CM | POA: Diagnosis not present

## 2016-05-09 LAB — CBC WITH DIFFERENTIAL/PLATELET
BASOS ABS: 0 10*3/uL (ref 0.0–0.1)
Basophils Relative: 0.4 % (ref 0.0–3.0)
EOS ABS: 0.5 10*3/uL (ref 0.0–0.7)
Eosinophils Relative: 7.2 % — ABNORMAL HIGH (ref 0.0–5.0)
HEMATOCRIT: 38.1 % — AB (ref 39.0–52.0)
Hemoglobin: 12.2 g/dL — ABNORMAL LOW (ref 13.0–17.0)
LYMPHS PCT: 24.3 % (ref 12.0–46.0)
Lymphs Abs: 1.7 10*3/uL (ref 0.7–4.0)
MCHC: 32 g/dL (ref 30.0–36.0)
MCV: 91.8 fl (ref 78.0–100.0)
Monocytes Absolute: 0.6 10*3/uL (ref 0.1–1.0)
Monocytes Relative: 8.1 % (ref 3.0–12.0)
NEUTROS ABS: 4.3 10*3/uL (ref 1.4–7.7)
NEUTROS PCT: 60 % (ref 43.0–77.0)
PLATELETS: 158 10*3/uL (ref 150.0–400.0)
RBC: 4.14 Mil/uL — ABNORMAL LOW (ref 4.22–5.81)
RDW: 18.5 % — ABNORMAL HIGH (ref 11.5–15.5)
WBC: 7.2 10*3/uL (ref 4.0–10.5)

## 2016-05-11 ENCOUNTER — Telehealth: Payer: Self-pay | Admitting: Family Medicine

## 2016-05-11 NOTE — Telephone Encounter (Signed)
Pt dropped off DMV packet to be filled out for drivers license evaluation. Please call [phone] when complete. Placed in Rx tower.

## 2016-05-11 NOTE — Telephone Encounter (Signed)
Packet is on your desk.

## 2016-05-11 NOTE — Telephone Encounter (Signed)
I'll work on the hard copy.  Thanks.  

## 2016-05-13 ENCOUNTER — Other Ambulatory Visit: Payer: Self-pay | Admitting: Family Medicine

## 2016-05-13 DIAGNOSIS — D649 Anemia, unspecified: Secondary | ICD-10-CM

## 2016-05-13 DIAGNOSIS — E1142 Type 2 diabetes mellitus with diabetic polyneuropathy: Secondary | ICD-10-CM

## 2016-05-16 NOTE — Telephone Encounter (Signed)
Patient called to find out if paperwork has been completed.  Patient needs to turn it in by 05/22/16.  Please call patient at [phone] when paperwork is completed.

## 2016-05-16 NOTE — Telephone Encounter (Signed)
I was out of town for a week and just got it done over the weekend.  Should be good to do. Thanks.

## 2016-05-16 NOTE — Telephone Encounter (Signed)
Left detailed message on voicemail. PPW left at front desk for pickup.

## 2016-05-23 ENCOUNTER — Other Ambulatory Visit: Payer: Self-pay | Admitting: Cardiovascular Disease

## 2016-05-23 DIAGNOSIS — I779 Disorder of arteries and arterioles, unspecified: Secondary | ICD-10-CM

## 2016-05-23 DIAGNOSIS — I739 Peripheral vascular disease, unspecified: Principal | ICD-10-CM

## 2016-05-23 MED ORDER — RANOLAZINE ER 500 MG PO TB12: 500.0000 mg | ORAL_TABLET | Freq: Two times a day (BID) | ORAL | Status: DC

## 2016-05-23 NOTE — Telephone Encounter (Signed)
Rx request sent to pharmacy.  

## 2016-06-03 DIAGNOSIS — Z08 Encounter for follow-up examination after completed treatment for malignant neoplasm: Secondary | ICD-10-CM | POA: Diagnosis not present

## 2016-06-03 DIAGNOSIS — C44519 Basal cell carcinoma of skin of other part of trunk: Secondary | ICD-10-CM | POA: Diagnosis not present

## 2016-06-03 DIAGNOSIS — Z85820 Personal history of malignant melanoma of skin: Secondary | ICD-10-CM | POA: Diagnosis not present

## 2016-06-06 DIAGNOSIS — D631 Anemia in chronic kidney disease: Secondary | ICD-10-CM | POA: Diagnosis not present

## 2016-06-06 DIAGNOSIS — E889 Metabolic disorder, unspecified: Secondary | ICD-10-CM | POA: Diagnosis not present

## 2016-06-06 DIAGNOSIS — M109 Gout, unspecified: Secondary | ICD-10-CM | POA: Diagnosis not present

## 2016-06-06 DIAGNOSIS — E669 Obesity, unspecified: Secondary | ICD-10-CM | POA: Diagnosis not present

## 2016-06-06 DIAGNOSIS — N183 Chronic kidney disease, stage 3 (moderate): Secondary | ICD-10-CM | POA: Diagnosis not present

## 2016-06-06 DIAGNOSIS — M9080 Osteopathy in diseases classified elsewhere, unspecified site: Secondary | ICD-10-CM | POA: Diagnosis not present

## 2016-06-22 ENCOUNTER — Other Ambulatory Visit: Payer: Self-pay | Admitting: Cardiovascular Disease

## 2016-07-04 ENCOUNTER — Other Ambulatory Visit (INDEPENDENT_AMBULATORY_CARE_PROVIDER_SITE_OTHER): Payer: Medicare Other

## 2016-07-04 DIAGNOSIS — E1142 Type 2 diabetes mellitus with diabetic polyneuropathy: Secondary | ICD-10-CM | POA: Diagnosis not present

## 2016-07-04 DIAGNOSIS — D649 Anemia, unspecified: Secondary | ICD-10-CM

## 2016-07-04 LAB — CBC WITH DIFFERENTIAL/PLATELET
BASOS ABS: 0 10*3/uL (ref 0.0–0.1)
BASOS PCT: 0.4 % (ref 0.0–3.0)
Eosinophils Absolute: 0.4 10*3/uL (ref 0.0–0.7)
Eosinophils Relative: 5.9 % — ABNORMAL HIGH (ref 0.0–5.0)
HEMATOCRIT: 36.2 % — AB (ref 39.0–52.0)
Hemoglobin: 12 g/dL — ABNORMAL LOW (ref 13.0–17.0)
LYMPHS PCT: 22.4 % (ref 12.0–46.0)
Lymphs Abs: 1.5 10*3/uL (ref 0.7–4.0)
MCHC: 33 g/dL (ref 30.0–36.0)
MCV: 92.8 fl (ref 78.0–100.0)
MONOS PCT: 9 % (ref 3.0–12.0)
Monocytes Absolute: 0.6 10*3/uL (ref 0.1–1.0)
NEUTROS ABS: 4.1 10*3/uL (ref 1.4–7.7)
Neutrophils Relative %: 62.3 % (ref 43.0–77.0)
PLATELETS: 164 10*3/uL (ref 150.0–400.0)
RBC: 3.9 Mil/uL — ABNORMAL LOW (ref 4.22–5.81)
RDW: 16.2 % — AB (ref 11.5–15.5)
WBC: 6.6 10*3/uL (ref 4.0–10.5)

## 2016-07-04 LAB — HEMOGLOBIN A1C: Hgb A1c MFr Bld: 6.9 % — ABNORMAL HIGH (ref 4.6–6.5)

## 2016-07-05 ENCOUNTER — Other Ambulatory Visit: Payer: Self-pay | Admitting: Family Medicine

## 2016-07-05 DIAGNOSIS — Z85828 Personal history of other malignant neoplasm of skin: Secondary | ICD-10-CM | POA: Diagnosis not present

## 2016-07-05 DIAGNOSIS — L570 Actinic keratosis: Secondary | ICD-10-CM | POA: Diagnosis not present

## 2016-07-05 DIAGNOSIS — Z08 Encounter for follow-up examination after completed treatment for malignant neoplasm: Secondary | ICD-10-CM | POA: Diagnosis not present

## 2016-07-05 DIAGNOSIS — X32XXXD Exposure to sunlight, subsequent encounter: Secondary | ICD-10-CM | POA: Diagnosis not present

## 2016-07-07 ENCOUNTER — Ambulatory Visit (INDEPENDENT_AMBULATORY_CARE_PROVIDER_SITE_OTHER): Payer: Medicare Other | Admitting: Family Medicine

## 2016-07-07 ENCOUNTER — Encounter: Payer: Self-pay | Admitting: Family Medicine

## 2016-07-07 VITALS — BP 142/64 | HR 84 | Temp 98.4°F | Wt 191.2 lb

## 2016-07-07 DIAGNOSIS — Z23 Encounter for immunization: Secondary | ICD-10-CM | POA: Diagnosis not present

## 2016-07-07 DIAGNOSIS — G4730 Sleep apnea, unspecified: Secondary | ICD-10-CM

## 2016-07-07 DIAGNOSIS — E1142 Type 2 diabetes mellitus with diabetic polyneuropathy: Secondary | ICD-10-CM

## 2016-07-07 DIAGNOSIS — E119 Type 2 diabetes mellitus without complications: Secondary | ICD-10-CM

## 2016-07-07 DIAGNOSIS — E1140 Type 2 diabetes mellitus with diabetic neuropathy, unspecified: Secondary | ICD-10-CM

## 2016-07-07 DIAGNOSIS — D649 Anemia, unspecified: Secondary | ICD-10-CM | POA: Diagnosis not present

## 2016-07-07 DIAGNOSIS — I208 Other forms of angina pectoris: Secondary | ICD-10-CM

## 2016-07-07 DIAGNOSIS — Z794 Long term (current) use of insulin: Secondary | ICD-10-CM

## 2016-07-07 MED ORDER — FERROUS SULFATE 325 (65 FE) MG PO TBEC: 325.0000 mg | DELAYED_RELEASE_TABLET | Freq: Every day | ORAL | Status: DC

## 2016-07-07 NOTE — Patient Instructions (Signed)
Recheck labs in about 3 months before a visit.  Cut the iron back to 1 tab a day.  Don't change your insulin.  Take care.  Glad to see you.

## 2016-07-07 NOTE — Progress Notes (Signed)
Pre visit review using our clinic review tool, if applicable. No additional management support is needed unless otherwise documented below in the visit note. 

## 2016-07-07 NOTE — Progress Notes (Addendum)
Diabetes:  Using medications without difficulties:yes Hypoglycemic episodes:no Hyperglycemic episodes:no Feet problems: some tingling at baseline. Blood Sugars averaging:  Usually ~140 max in the AM, this AM was 110 eye exam within last year: done 2 months ago.  No retinopathy per patient report.    OSA.  Uses nightly, compliant, with relief.  No adverse events with use.    Anemia.  No bleeding.  On iron.  D/w pt about cutting back to 1 iron tab a day and recheck in 3 months.   PMH and SH reviewed  Meds, vitals, and allergies reviewed.   ROS: Per HPI unless specifically indicated in ROS section   GEN: nad, alert and oriented HEENT: mucous membranes moist NECK: supple w/o LA CV: sounds to be RRR PULM: ctab, no inc wob ABD: soft, +bs EXT: trace BLE edema SKIN: no acute rash  Diabetic foot exam: Normal inspection No skin breakdown No calluses  Normal DP pulses Mild dec sensation to light touch and monofilament Nails thickened

## 2016-07-08 NOTE — Assessment & Plan Note (Signed)
A1c at goal, continue as is.  No change in insulin.  He agrees.  Recheck in about 3 months. >25 minutes spent in face to face time with patient, >50% spent in counselling or coordination of care.

## 2016-07-08 NOTE — Assessment & Plan Note (Signed)
No bleeding.  On iron.  D/w pt about cutting back to 1 iron tab a day and recheck in 3 months.  He agrees.

## 2016-07-08 NOTE — Assessment & Plan Note (Signed)
Uses nightly, compliant, with relief.  No adverse events with use.

## 2016-07-11 ENCOUNTER — Other Ambulatory Visit: Payer: Self-pay | Admitting: *Deleted

## 2016-07-11 ENCOUNTER — Encounter: Payer: Self-pay | Admitting: Family Medicine

## 2016-07-11 MED ORDER — METOPROLOL TARTRATE 100 MG PO TABS: 50.0000 mg | ORAL_TABLET | Freq: Two times a day (BID) | ORAL | 3 refills | Status: DC

## 2016-07-12 ENCOUNTER — Telehealth: Payer: Self-pay

## 2016-07-12 ENCOUNTER — Other Ambulatory Visit: Payer: Self-pay | Admitting: *Deleted

## 2016-07-12 MED ORDER — INSULIN GLARGINE 100 UNIT/ML ~~LOC~~ SOLN: 15.0000 [IU] | Freq: Every day | SUBCUTANEOUS | 99 refills | Status: DC

## 2016-07-12 NOTE — Telephone Encounter (Signed)
Resent.  Thanks.

## 2016-07-12 NOTE — Telephone Encounter (Signed)
[facility] left vm requesting cb about clarification of lantus instructions; ref # [id number].

## 2016-07-12 NOTE — Telephone Encounter (Signed)
Already refilled yesterday

## 2016-07-22 ENCOUNTER — Other Ambulatory Visit: Payer: Self-pay | Admitting: *Deleted

## 2016-08-09 DIAGNOSIS — L820 Inflamed seborrheic keratosis: Secondary | ICD-10-CM | POA: Diagnosis not present

## 2016-08-09 DIAGNOSIS — L821 Other seborrheic keratosis: Secondary | ICD-10-CM | POA: Diagnosis not present

## 2016-08-09 DIAGNOSIS — Z08 Encounter for follow-up examination after completed treatment for malignant neoplasm: Secondary | ICD-10-CM | POA: Diagnosis not present

## 2016-08-09 DIAGNOSIS — Z85828 Personal history of other malignant neoplasm of skin: Secondary | ICD-10-CM | POA: Diagnosis not present

## 2016-08-09 DIAGNOSIS — Z1283 Encounter for screening for malignant neoplasm of skin: Secondary | ICD-10-CM | POA: Diagnosis not present

## 2016-08-09 DIAGNOSIS — Z85820 Personal history of malignant melanoma of skin: Secondary | ICD-10-CM | POA: Diagnosis not present

## 2016-09-19 ENCOUNTER — Ambulatory Visit (INDEPENDENT_AMBULATORY_CARE_PROVIDER_SITE_OTHER): Payer: Medicare Other | Admitting: Family Medicine

## 2016-09-19 ENCOUNTER — Ambulatory Visit (INDEPENDENT_AMBULATORY_CARE_PROVIDER_SITE_OTHER)
Admission: RE | Admit: 2016-09-19 | Discharge: 2016-09-19 | Disposition: A | Discharge status: Home / Self Care | Payer: Medicare Other | Source: Ambulatory Visit | Attending: Family Medicine | Admitting: Family Medicine

## 2016-09-19 ENCOUNTER — Encounter: Payer: Self-pay | Admitting: Family Medicine

## 2016-09-19 VITALS — BP 140/76 | HR 72 | Temp 98.4°F | Ht 67.0 in | Wt 195.5 lb

## 2016-09-19 DIAGNOSIS — I208 Other forms of angina pectoris: Secondary | ICD-10-CM

## 2016-09-19 DIAGNOSIS — M1711 Unilateral primary osteoarthritis, right knee: Secondary | ICD-10-CM

## 2016-09-19 DIAGNOSIS — M25561 Pain in right knee: Secondary | ICD-10-CM | POA: Diagnosis not present

## 2016-09-19 MED ORDER — METHYLPREDNISOLONE ACETATE 40 MG/ML IJ SUSP
80.0000 mg | Freq: Once | INTRAMUSCULAR | Status: AC
Start: 2016-09-19 — End: 2016-09-19
  Administered 2016-09-19: 80 mg via INTRA_ARTICULAR

## 2016-09-19 NOTE — Progress Notes (Signed)
Dr. Frederico Hamman T. Seville Downs, MD, [facility] [place], [address] Phone: [phone] Fax: [phone]  09/19/2016  Patient: [name], MRN: [id number], DOB: 10-16-1932, 82 y.o.  Primary Physician:  [name], MD   Chief Complaint  Patient presents with  . Knee Pain    with Swelling-Right   Subjective:   [name] is a 82 y.o. very pleasant male patient who presents with the following:  Large effusion, I saw him 3 years ago. This patient has many medical problems. I saw him 3 years, and he had a large effusion in the right knee. I aspirated this, and he had a hemarthrosis. Ultimately I did a MRI which showed a large loose body at 15 millimeters as well as multiple other loose bodies present and end-stage degenerative changes, particularly in the patellofemoral joint.  Now he continues to have some acute pain after being more active and twisting last week. He has a very large effusion. He does take a baby aspirin daily.   Past Medical History, Surgical History, Social History, Family History, Problem List, Medications, and Allergies have been reviewed and updated if relevant.  Patient Active Problem List   Diagnosis Date Noted  . Weakness generalized   . Acute gout 01/19/2016  . Pressure ulcer 01/18/2016  . Diastolic dysfunction-grade 2 with EF 50-55% 01/18/2016  . Effusion of left knee joint 01/18/2016  . Acute GI bleeding   . Rectal bleeding   . Palliative care encounter   . DNR (do not resuscitate) discussion   . History of GI diverticular bleed   . Diastolic CHF ([place]) 86/57/8469  . Atrial fibrillation ([place]) 01/03/2016  . Angina at rest [facility])   . Chronic combined systolic and diastolic CHF (congestive heart failure) ([place])   . Pulmonary hypertension   . Pneumonia 12/01/2015  . Acute on chronic diastolic (congestive) heart failure 11/20/2015  . Hypokalemia 11/18/2015  . HCAP (healthcare-associated  pneumonia) 11/17/2015  . NSTEMI (non-ST elevated myocardial infarction) ([place])   . Diverticulosis of colon with hemorrhage   . GI bleed 11/13/2015  . Symptomatic anemia 11/13/2015  . Acute-on-chronic kidney injury ([place])   . Anemia   . Tremor 09/01/2015  . Gout 09/09/2014  . Medicare annual wellness visit, initial 09/09/2014  . Advance care planning 09/09/2014  . Ejection fraction   . Heme positive stool 06/19/2014  . Long-term (current) use of anticoagulants   . RBBB (right bundle branch block)   . Carotid artery disease ([place])   . Hx of CABG   . History of gastrointestinal bleeding   . Mitral regurgitation   . ED (erectile dysfunction) 03/01/2012  . CKD (chronic kidney disease) stage 3, GFR 30-59 ml/min 03/24/2011  . Atrial flutter ([place]) 08/24/2010  . Paroxysmal atrial fibrillation ([place]) 08/09/2010  . Peripheral sensory neuropathy due to type 2 diabetes mellitus ([place])   . GERD   . Sleep apnea   . Type 2 diabetes mellitus with peripheral neuropathy ([name])   . Hyperlipidemia   . Hypertensive heart disease     Past Medical History:  Diagnosis Date  . Age-related macular degeneration, wet, both eyes ([place])   . Anemia    Secondary to acute blood loss  . Anginal pain ([place])   . Arthritis    "mild in hands, knees, ankles" (11/13/2015)  . Atrial fibrillation ([place])    Consideration was given for atrial flutter ablation, but patient developed atrial fibrillation. Cardioversion was done.  Dr. Caryl Comes decided to watch him clinically. November, 2011  . Atrial flutter [facility]) 07/2010   September, 2011   Hospital with PNA and cath done.Marland KitchenMarland KitchenCoumadin.  Atrial flutter ablation planned, but  pt. then had atrial fibrillation,/outpatient conversion 09/08/10..NSR..plan to follow..Dr. Caryl Comes  . CAD (coronary artery disease)    Catheterization, September, 2011,  grafts patent from redo CABG,, medical therapy of coronary disease, consideration to proceeding with atrial flutter ablation  . Carotid artery  disease ([place])    Doppler 09/18/2009 - 49% bilateral stenoses  . Carotid artery disease ([name])    49% bilateral, Doppler, November, 2010  . Chronic kidney disease (CKD), stage III (moderate)   . Diabetic peripheral neuropathy ([place])   . Diverticulosis of colon with hemorrhage 2009   several unit diverticular bleed   . Ejection fraction    EF 60%, echo, 2009  //   EF 65%, echo, September, 2011  . Erectile dysfunction    Mild  . Gout   . History of blood transfusion "several"   "related to diverticular bleeding"  . Hyperlipidemia   . Hypertension   . Mitral regurgitation    Mild, echo, September, 2011  . NSTEMI (non-ST elevated myocardial infarction) ([place]) 07/2010   at [facility] with repeat cath, rec medical mgmt   . OSA on CPAP   . Personal history of colonic polyps   . PNA (pneumonia) 9/11   NSTEMI at [facility] with repeat cath, rec medical mgmt   . RBBB (right bundle branch block)   . Shoulder pain    "positional; better now" (11/13/2015)  . Skin cancer    R lower leg, per derm 2012  . Type II diabetes mellitus ([place])     Past Surgical History:  Procedure Laterality Date  . CARDIAC CATHETERIZATION  2006   Nuclear..slight lateral ischemia..medical therapy  . CARDIAC CATHETERIZATION  08/04/2010   grafts patent from redo CABG...medical Rx and ablate Atrial flutter (LV not injected)   . CARDIOVERSION  ~ 2010  . CAROTID ENDARTERECTOMY Right 1994  . CATARACT EXTRACTION W/ INTRAOCULAR LENS  IMPLANT, BILATERAL Bilateral   . COLON RESECTION N/A 01/13/2016   Procedure: EXPLORATORY LAPAROTOMY, LEFT AND SIGMOID COLON REMOVAL;  Surgeon: Ralene Ok, MD;  Location: [facility];  Service: General;  Laterality: N/A;  Extended open left hemicolectomy and sigmoidectomy   . COLONOSCOPY N/A 11/14/2015   Procedure: COLONOSCOPY;  Surgeon: Manus Gunning, MD;  Location: [facility] ENDOSCOPY;  Service: Gastroenterology;  Laterality: N/A;  . COLONOSCOPY Left 01/05/2016   Procedure: COLONOSCOPY;  Surgeon: Manus Gunning, MD;  Location: [place];  Service: Gastroenterology;  Laterality: Left;  no sedation to start, moderate if needed  . COLONOSCOPY W/ POLYPECTOMY    . CORONARY ARTERY BYPASS GRAFT  1995; 2006   "X 3; X3"  . [place], 2006  . DOPPLER ECHOCARDIOGRAPHY  08/2008   EF 60%  . DOPPLER ECHOCARDIOGRAPHY  08/02/2010   65-70%  . DOPPLER ECHOCARDIOGRAPHY  07/2010   MR mild  . ESOPHAGOGASTRODUODENOSCOPY N/A 06/19/2014   Procedure: ESOPHAGOGASTRODUODENOSCOPY (EGD);  Surgeon: Jerene Bears, MD;  Location: [facility] ENDOSCOPY;  Service: Endoscopy;  Laterality: N/A;  . ESOPHAGOGASTRODUODENOSCOPY N/A 11/14/2015   Procedure: ESOPHAGOGASTRODUODENOSCOPY (EGD);  Surgeon: Manus Gunning, MD;  Location: [place];  Service: Gastroenterology;  Laterality: N/A;  . FLEXIBLE SIGMOIDOSCOPY N/A 11/16/2015   Procedure: FLEXIBLE SIGMOIDOSCOPY;  Surgeon: Manus Gunning, MD;  Location: [place];  Service: Gastroenterology;  Laterality: N/A;  . LAPAROSCOPIC CHOLECYSTECTOMY  2008  . LEFT HEART CATH N/A 06/14/2014   Procedure: LEFT HEART CATH;  Surgeon: Sinclair Grooms, MD;  Location: [facility] CATH LAB;  Service: Cardiovascular;  Laterality: N/A;  . LEFT HEART CATHETERIZATION WITH CORONARY/GRAFT ANGIOGRAM N/A 06/11/2014   Procedure: LEFT HEART CATHETERIZATION WITH Beatrix Fetters;  Surgeon: Sinclair Grooms, MD;  Location: [facility] CATH LAB;  Service: Cardiovascular;  Laterality: N/A;  . PERCUTANEOUS CORONARY STENT INTERVENTION (PCI-S) N/A 06/13/2014   Procedure: PERCUTANEOUS CORONARY STENT INTERVENTION (PCI-S);  Surgeon: Sinclair Grooms, MD;  Location: [facility] CATH LAB;  Service: Cardiovascular;  Laterality: N/A;  . SKIN CANCER EXCISION Left 10/2015   calf  . SKIN CANCER EXCISION Right 2014?   chest  . TONSILLECTOMY    . VASECTOMY      Social History   Social History  . Marital status: Widowed    Spouse name: N/A  . Number of children: 2  . Years of education: N/A    Occupational History  . 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 Smoking status: Former Smoker    Packs/day: 1.00    Years: 8.00    Types: Cigarettes  . Smokeless tobacco: Never Used     Comment: "quit smoking cigarettes in 1958"  . Alcohol use No  . Drug use: No  . Sexual activity: Yes   Other Topics Concern  . Not on file   Social History Narrative   From [place].  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  Widowed 12/2005.    Family History  Problem Relation Age of Onset  . Kidney disease Mother     Kidney failure  . Stroke Mother   . Diabetes Mother   . Heart disease Father     MI  . Arthritis Sister   . Cancer Sister     Throat  . Heart disease Sister     MI  . Diabetes Sister   . Prostate cancer Neg Hx   . Colon cancer Neg Hx     No Known Allergies  Medication list reviewed and updated in full in [place].  GEN: No fevers, chills. Nontoxic. Primarily MSK c/o today. MSK: Detailed in the HPI GI: tolerating PO intake without difficulty Neuro: No numbness, parasthesias, or tingling associated. Otherwise the pertinent positives of the ROS are noted above.   Objective:   BP 140/76   Pulse 72   Temp 98.4 F (36.9 C) (Oral)   Ht 5\' 7"  (1.702 m)   Wt 195 lb 8 oz (88.7 kg)   BMI 30.62 kg/m    GEN: WDWN, NAD, Non-toxic, Alert & Oriented x 3 HEENT: Atraumatic, Normocephalic.  Ears and Nose: No external deformity. EXTR: No clubbing/cyanosis/tr edema NEURO: Normal gait but antalgic PSYCH: Normally interactive. Conversant. Not depressed or anxious appearing.  Calm demeanor.    Right knee limited in extension 5. Flexion to 80. Tender on the medial and lateral joint line. Large ballotable effusion. Special testing causes pain, but appears to have intact anterior cruciate ligament, PCL, MCL, and LCL.  Radiology: No results found.  Assessment and  Plan:   Acute pain of right knee - Plan: methylPREDNISolone acetate (DEPO-MEDROL) injection 80 mg, DG Knee AP/LAT W/[place] Right  Primary osteoarthritis of right knee - Plan: methylPREDNISolone acetate (DEPO-MEDROL) injection 80 mg, DG Knee AP/LAT W/[place] Right  Hemarthrosis with osteoarthritis, loose bodies, and acute pain  of the right knee.  X-rays, right knee. Indication, pain, hemarthrosis Findings: In stage patellofemoral osteoarthritis with moderate to severe medial compartmental osteoarthritis. Very large loose body in the suprapatellar pouch. Probable other loose bodies. Atherosclerosis is quite prominent.  Knee Aspiration and Injection, R Patient verbally consented; risks, benefits, and alternatives explained including possible infection. Patient prepped with Chloraprep. Ethyl chloride for anesthesia. 10 cc of 1% Lidocaine used in wheal then injected Subcutaneous fashion with 22 gauge needle on lateral approach. Under sterilne conditions, 18 gauge needle used via lateral approach to aspirate 100 cc of dark, bloody fluid. Then the procedure was stopped to check x-ray. No acute fractures are seen. Then 2 mL of Depo-Medrol 40 mg injected through anteromedial portal. Tolerated well, decreased pain, no complications.   Follow-up: prn  Orders Placed This Encounter  Procedures  . DG Knee AP/LAT W/Sun[place] Right    Signed,  Reyann Troop T. Sherif Millspaugh, MD   Patient's Medications  New Prescriptions   No medications on file  Previous Medications   ASPIRIN 81 MG TABLET    Take 81 mg by mouth daily.   BD INSULIN SYRINGE ULTRAFINE 31G X 5/16" 0.3 ML MISC    USE DAILY AS INSTRUCTED   CHOLECALCIFEROL (VITAMIN D) 1000 UNITS CAPSULE    Take 1,000 Units by mouth daily.    COLCHICINE 0.6 MG TABLET    Take 1 tablet (0.6 mg total) by mouth daily as needed (gout flare up).   FERROUS SULFATE 325 (65 FE) MG EC TABLET    Take 1 tablet (325 mg total) by mouth daily.   FISH OIL-OMEGA-3 FATTY ACIDS 1000 MG  CAPSULE    Take 1 g by mouth daily.    FUROSEMIDE (LASIX) 80 MG TABLET    Take 80 mg by mouth daily.   GLUCOSE BLOOD (FREESTYLE LITE) TEST STRIP    USE TO TEST BLOOD SUGAR ONCE DAILY AS ANS NEEDED FOR DM (DX. E11.40)   INSULIN GLARGINE (LANTUS) 100 UNIT/ML INJECTION    Inject 0.15 mLs (15 Units total) into the skin at bedtime.   ISOSORBIDE MONONITRATE (IMDUR) 60 MG 24 HR TABLET    Take 1 tablet (60 mg total) by mouth 2 (two) times daily with a meal.   MAGNESIUM OXIDE (MAG-OX) 400 MG TABLET    Take 400 mg by mouth daily.    METOPROLOL (LOPRESSOR) 100 MG TABLET    Take 0.5 tablets (50 mg total) by mouth 2 (two) times daily.   MULTIPLE VITAMIN (MULTIVITAMIN WITH MINERALS) TABS    Take 1 tablet by mouth daily.   NITROGLYCERIN (NITROSTAT) 0.4 MG SL TABLET    Place 1 tablet (0.4 mg total) under the tongue every 5 (five) minutes as needed for chest pain.   POTASSIUM GLUCONATE (K-99) 595 MG CAPS    Take 595 mg by mouth daily.    RANOLAZINE (RANEXA) 500 MG 12 HR TABLET    Take 1 tablet (500 mg total) by mouth 2 (two) times daily.   ROSUVASTATIN (CRESTOR) 20 MG TABLET    TAKE 1 TABLET DAILY   TRAMADOL (ULTRAM) 50 MG TABLET    Take 1-2 tablets (50-100 mg total) by mouth every 12 (twelve) hours as needed.   TRIAMCINOLONE ACETONIDE (TRIAMCINOLONE 0.1 % CREAM : EUCERIN) CREA    Apply 1 application topically 2 (two) times daily.  Modified Medications   No medications on file  Discontinued Medications   No medications on file

## 2016-09-19 NOTE — Progress Notes (Signed)
Pre visit review using our clinic review tool, if applicable. No additional management support is needed unless otherwise documented below in the visit note. 

## 2016-11-25 ENCOUNTER — Ambulatory Visit (HOSPITAL_COMMUNITY)
Admission: RE | Admit: 2016-11-25 | Discharge: 2016-11-25 | Disposition: A | Discharge status: Home / Self Care | Payer: Medicare Other | Source: Ambulatory Visit | Attending: Internal Medicine | Admitting: Internal Medicine

## 2016-11-25 DIAGNOSIS — Z951 Presence of aortocoronary bypass graft: Secondary | ICD-10-CM | POA: Diagnosis not present

## 2016-11-25 DIAGNOSIS — E785 Hyperlipidemia, unspecified: Secondary | ICD-10-CM | POA: Insufficient documentation

## 2016-11-25 DIAGNOSIS — Z87891 Personal history of nicotine dependence: Secondary | ICD-10-CM | POA: Insufficient documentation

## 2016-11-25 DIAGNOSIS — I6523 Occlusion and stenosis of bilateral carotid arteries: Secondary | ICD-10-CM | POA: Diagnosis not present

## 2016-11-25 DIAGNOSIS — I2510 Atherosclerotic heart disease of native coronary artery without angina pectoris: Secondary | ICD-10-CM | POA: Insufficient documentation

## 2016-11-25 DIAGNOSIS — E119 Type 2 diabetes mellitus without complications: Secondary | ICD-10-CM | POA: Insufficient documentation

## 2016-11-25 DIAGNOSIS — I10 Essential (primary) hypertension: Secondary | ICD-10-CM | POA: Diagnosis not present

## 2016-11-28 ENCOUNTER — Other Ambulatory Visit: Payer: Self-pay | Admitting: *Deleted

## 2016-11-28 DIAGNOSIS — I779 Disorder of arteries and arterioles, unspecified: Secondary | ICD-10-CM

## 2016-11-28 DIAGNOSIS — I739 Peripheral vascular disease, unspecified: Principal | ICD-10-CM

## 2016-11-28 MED ORDER — ISOSORBIDE MONONITRATE ER 60 MG PO TB24: 60.0000 mg | ORAL_TABLET | Freq: Two times a day (BID) | ORAL | 1 refills | Status: DC

## 2016-11-28 MED ORDER — ROSUVASTATIN CALCIUM 20 MG PO TABS: 20.0000 mg | ORAL_TABLET | Freq: Every day | ORAL | 1 refills | Status: DC

## 2016-11-28 MED ORDER — METOPROLOL TARTRATE 100 MG PO TABS: 50.0000 mg | ORAL_TABLET | Freq: Two times a day (BID) | ORAL | 1 refills | Status: DC

## 2016-11-28 MED ORDER — RANOLAZINE ER 500 MG PO TB12: 500.0000 mg | ORAL_TABLET | Freq: Two times a day (BID) | ORAL | 1 refills | Status: DC

## 2016-11-28 MED ORDER — FUROSEMIDE 80 MG PO TABS: 80.0000 mg | ORAL_TABLET | Freq: Every day | ORAL | 1 refills | Status: DC

## 2016-11-28 NOTE — Telephone Encounter (Signed)
Rx(s) sent to pharmacy electronically.  

## 2016-11-29 ENCOUNTER — Other Ambulatory Visit: Payer: Self-pay | Admitting: Cardiovascular Disease

## 2016-11-29 DIAGNOSIS — I739 Peripheral vascular disease, unspecified: Principal | ICD-10-CM

## 2016-11-29 DIAGNOSIS — I779 Disorder of arteries and arterioles, unspecified: Secondary | ICD-10-CM

## 2016-12-07 ENCOUNTER — Telehealth: Payer: Self-pay

## 2016-12-07 ENCOUNTER — Encounter: Payer: Self-pay | Admitting: Family Medicine

## 2016-12-07 DIAGNOSIS — N183 Chronic kidney disease, stage 3 (moderate): Secondary | ICD-10-CM | POA: Diagnosis not present

## 2016-12-07 DIAGNOSIS — E889 Metabolic disorder, unspecified: Secondary | ICD-10-CM | POA: Diagnosis not present

## 2016-12-07 DIAGNOSIS — N189 Chronic kidney disease, unspecified: Secondary | ICD-10-CM | POA: Diagnosis not present

## 2016-12-07 LAB — CBC AND DIFFERENTIAL
Hemoglobin: 13.2 g/dL — AB (ref 13.5–17.5)
PLATELETS: 141 10*3/uL — AB (ref 150–399)
WBC: 7.5 10^3/mL

## 2016-12-07 LAB — BASIC METABOLIC PANEL
BUN: 32 mg/dL — AB (ref 4–21)
CREATININE: 2.1 mg/dL — AB (ref <=1.3)
Glucose: 148 mg/dL

## 2016-12-07 NOTE — Telephone Encounter (Signed)
Pt left a VM requesting a "90 day refill on tramadol 1 tab po TID" [facility]ill 02/18/16 #360 +1rfl. And a handicap placard for his car and mail it to him after Dr. Damita Dunnings signs it.

## 2016-12-08 DIAGNOSIS — D631 Anemia in chronic kidney disease: Secondary | ICD-10-CM | POA: Diagnosis not present

## 2016-12-08 DIAGNOSIS — N183 Chronic kidney disease, stage 3 (moderate): Secondary | ICD-10-CM | POA: Diagnosis not present

## 2016-12-08 DIAGNOSIS — I10 Essential (primary) hypertension: Secondary | ICD-10-CM | POA: Diagnosis not present

## 2016-12-08 DIAGNOSIS — E669 Obesity, unspecified: Secondary | ICD-10-CM | POA: Diagnosis not present

## 2016-12-08 DIAGNOSIS — M9080 Osteopathy in diseases classified elsewhere, unspecified site: Secondary | ICD-10-CM | POA: Diagnosis not present

## 2016-12-08 DIAGNOSIS — E889 Metabolic disorder, unspecified: Secondary | ICD-10-CM | POA: Diagnosis not present

## 2016-12-08 MED ORDER — TRAMADOL HCL 50 MG PO TABS: 50.0000 mg | ORAL_TABLET | Freq: Two times a day (BID) | ORAL | 1 refills | Status: DC | PRN

## 2016-12-08 NOTE — Telephone Encounter (Signed)
rx printed, can take 1-2 tab po BID or 1 tab tid if needed.  Form done.  Thanks.

## 2016-12-08 NOTE — Telephone Encounter (Signed)
Rx faxed to [facility], and DMV form mailed to pt, pt notified

## 2016-12-19 ENCOUNTER — Encounter: Payer: Self-pay | Admitting: Family Medicine

## 2016-12-21 DIAGNOSIS — Z85820 Personal history of malignant melanoma of skin: Secondary | ICD-10-CM | POA: Diagnosis not present

## 2016-12-21 DIAGNOSIS — X32XXXD Exposure to sunlight, subsequent encounter: Secondary | ICD-10-CM | POA: Diagnosis not present

## 2016-12-21 DIAGNOSIS — Z08 Encounter for follow-up examination after completed treatment for malignant neoplasm: Secondary | ICD-10-CM | POA: Diagnosis not present

## 2016-12-21 DIAGNOSIS — L570 Actinic keratosis: Secondary | ICD-10-CM | POA: Diagnosis not present

## 2016-12-21 DIAGNOSIS — Z1283 Encounter for screening for malignant neoplasm of skin: Secondary | ICD-10-CM | POA: Diagnosis not present

## 2017-01-01 ENCOUNTER — Other Ambulatory Visit: Payer: Self-pay | Admitting: Family Medicine

## 2017-01-24 ENCOUNTER — Other Ambulatory Visit: Payer: Self-pay

## 2017-01-24 MED ORDER — NITROGLYCERIN 0.4 MG SL SUBL: 0.4000 mg | SUBLINGUAL_TABLET | SUBLINGUAL | 1 refills | Status: DC | PRN

## 2017-02-09 DIAGNOSIS — H52223 Regular astigmatism, bilateral: Secondary | ICD-10-CM | POA: Diagnosis not present

## 2017-02-09 DIAGNOSIS — H353112 Nonexudative age-related macular degeneration, right eye, intermediate dry stage: Secondary | ICD-10-CM | POA: Diagnosis not present

## 2017-02-09 DIAGNOSIS — H5213 Myopia, bilateral: Secondary | ICD-10-CM | POA: Diagnosis not present

## 2017-02-09 DIAGNOSIS — E103212 Type 1 diabetes mellitus with mild nonproliferative diabetic retinopathy with macular edema, left eye: Secondary | ICD-10-CM | POA: Diagnosis not present

## 2017-02-09 DIAGNOSIS — H353221 Exudative age-related macular degeneration, left eye, with active choroidal neovascularization: Secondary | ICD-10-CM | POA: Diagnosis not present

## 2017-02-09 DIAGNOSIS — Z794 Long term (current) use of insulin: Secondary | ICD-10-CM | POA: Diagnosis not present

## 2017-02-19 IMAGING — NM NM GI BLOOD LOSS
2 series · 12 of 12 positions shown · non-contrast
Comparison: 01/03/2016

CLINICAL DATA: GI hemorrhage and rectal bleeding

EXAM:
NUCLEAR MEDICINE GASTROINTESTINAL BLEEDING SCAN
TECHNIQUE: Sequential abdominal images were obtained following intravenous
administration of 4c-SSm labeled red blood cells.
RADIOPHARMACEUTICALS:  25.1 mCi 4c-SSm in-vitro labeled red cells.

[gi gi bleed · 5.01mm/px · 6 of 60 frames shown (1 of 2)]
[frame 6/60]
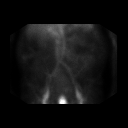
[frame 16/60]
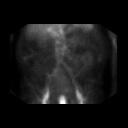
[frame 26/60]
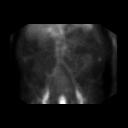
[frame 36/60]
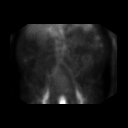
[frame 46/60]
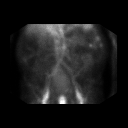
[frame 56/60]
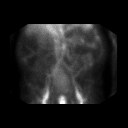

[gi gi bleed · 5.01mm/px · 6 of 60 frames shown (2 of 2)]
[frame 6/60]
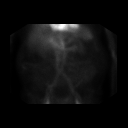
[frame 16/60]
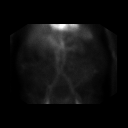
[frame 26/60]
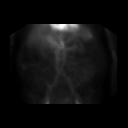
[frame 36/60]
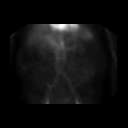
[frame 46/60]
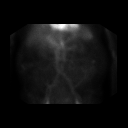
[frame 56/60]
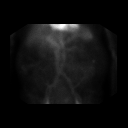

[12 of 12 positions shown; findings below may reference images not displayed]

FINDINGS: There are now areas of increased activity identified in the left mid
abdomen which demonstrate mobility and likely lie within the distal
transverse colon and proximal descending colon consistent with acute
hemorrhage. No other focal area of hemorrhage is seen.
IMPRESSION: Areas of increased activity in the far lateral left abdomen likely
related to the distal transverse and proximal descending colon.

## 2017-02-24 DIAGNOSIS — L570 Actinic keratosis: Secondary | ICD-10-CM | POA: Diagnosis not present

## 2017-02-24 DIAGNOSIS — X32XXXD Exposure to sunlight, subsequent encounter: Secondary | ICD-10-CM | POA: Diagnosis not present

## 2017-03-01 ENCOUNTER — Encounter (INDEPENDENT_AMBULATORY_CARE_PROVIDER_SITE_OTHER): Payer: Medicare Other | Admitting: Ophthalmology

## 2017-03-01 DIAGNOSIS — H35033 Hypertensive retinopathy, bilateral: Secondary | ICD-10-CM | POA: Diagnosis not present

## 2017-03-01 DIAGNOSIS — E11311 Type 2 diabetes mellitus with unspecified diabetic retinopathy with macular edema: Secondary | ICD-10-CM | POA: Diagnosis not present

## 2017-03-01 DIAGNOSIS — H43813 Vitreous degeneration, bilateral: Secondary | ICD-10-CM | POA: Diagnosis not present

## 2017-03-01 DIAGNOSIS — I10 Essential (primary) hypertension: Secondary | ICD-10-CM | POA: Diagnosis not present

## 2017-03-01 DIAGNOSIS — H318 Other specified disorders of choroid: Secondary | ICD-10-CM

## 2017-03-01 DIAGNOSIS — H4423 Degenerative myopia, bilateral: Secondary | ICD-10-CM

## 2017-03-01 DIAGNOSIS — E113312 Type 2 diabetes mellitus with moderate nonproliferative diabetic retinopathy with macular edema, left eye: Secondary | ICD-10-CM

## 2017-03-01 DIAGNOSIS — E113291 Type 2 diabetes mellitus with mild nonproliferative diabetic retinopathy without macular edema, right eye: Secondary | ICD-10-CM | POA: Diagnosis not present

## 2017-03-23 ENCOUNTER — Encounter (INDEPENDENT_AMBULATORY_CARE_PROVIDER_SITE_OTHER): Payer: Medicare Other | Admitting: Ophthalmology

## 2017-03-23 DIAGNOSIS — H318 Other specified disorders of choroid: Secondary | ICD-10-CM | POA: Diagnosis not present

## 2017-03-23 DIAGNOSIS — H4423 Degenerative myopia, bilateral: Secondary | ICD-10-CM | POA: Diagnosis not present

## 2017-03-23 DIAGNOSIS — E113312 Type 2 diabetes mellitus with moderate nonproliferative diabetic retinopathy with macular edema, left eye: Secondary | ICD-10-CM | POA: Diagnosis not present

## 2017-03-23 DIAGNOSIS — E113291 Type 2 diabetes mellitus with mild nonproliferative diabetic retinopathy without macular edema, right eye: Secondary | ICD-10-CM

## 2017-03-23 DIAGNOSIS — H43813 Vitreous degeneration, bilateral: Secondary | ICD-10-CM

## 2017-03-23 DIAGNOSIS — H35033 Hypertensive retinopathy, bilateral: Secondary | ICD-10-CM

## 2017-03-23 DIAGNOSIS — E11311 Type 2 diabetes mellitus with unspecified diabetic retinopathy with macular edema: Secondary | ICD-10-CM | POA: Diagnosis not present

## 2017-03-23 DIAGNOSIS — I10 Essential (primary) hypertension: Secondary | ICD-10-CM | POA: Diagnosis not present

## 2017-04-20 ENCOUNTER — Encounter (INDEPENDENT_AMBULATORY_CARE_PROVIDER_SITE_OTHER): Payer: Medicare Other | Admitting: Ophthalmology

## 2017-04-20 DIAGNOSIS — H43813 Vitreous degeneration, bilateral: Secondary | ICD-10-CM | POA: Diagnosis not present

## 2017-04-20 DIAGNOSIS — E113291 Type 2 diabetes mellitus with mild nonproliferative diabetic retinopathy without macular edema, right eye: Secondary | ICD-10-CM

## 2017-04-20 DIAGNOSIS — H4423 Degenerative myopia, bilateral: Secondary | ICD-10-CM

## 2017-04-20 DIAGNOSIS — E11311 Type 2 diabetes mellitus with unspecified diabetic retinopathy with macular edema: Secondary | ICD-10-CM | POA: Diagnosis not present

## 2017-04-20 DIAGNOSIS — E113312 Type 2 diabetes mellitus with moderate nonproliferative diabetic retinopathy with macular edema, left eye: Secondary | ICD-10-CM

## 2017-04-20 DIAGNOSIS — I10 Essential (primary) hypertension: Secondary | ICD-10-CM | POA: Diagnosis not present

## 2017-04-20 DIAGNOSIS — H35033 Hypertensive retinopathy, bilateral: Secondary | ICD-10-CM

## 2017-04-20 DIAGNOSIS — H318 Other specified disorders of choroid: Secondary | ICD-10-CM

## 2017-05-08 ENCOUNTER — Other Ambulatory Visit: Payer: Self-pay | Admitting: Family Medicine

## 2017-05-08 ENCOUNTER — Ambulatory Visit (INDEPENDENT_AMBULATORY_CARE_PROVIDER_SITE_OTHER): Payer: Medicare Other

## 2017-05-08 VITALS — BP 122/70 | HR 66 | Temp 98.1°F | Ht 66.0 in | Wt 196.5 lb

## 2017-05-08 DIAGNOSIS — E119 Type 2 diabetes mellitus without complications: Secondary | ICD-10-CM | POA: Diagnosis not present

## 2017-05-08 DIAGNOSIS — D508 Other iron deficiency anemias: Secondary | ICD-10-CM

## 2017-05-08 DIAGNOSIS — E1142 Type 2 diabetes mellitus with diabetic polyneuropathy: Secondary | ICD-10-CM | POA: Diagnosis not present

## 2017-05-08 DIAGNOSIS — Z0000 Encounter for general adult medical examination without abnormal findings: Secondary | ICD-10-CM | POA: Diagnosis not present

## 2017-05-08 LAB — CBC WITH DIFFERENTIAL/PLATELET
BASOS PCT: 0.6 % (ref 0.0–3.0)
Basophils Absolute: 0 10*3/uL (ref 0.0–0.1)
EOS PCT: 4.9 % (ref 0.0–5.0)
Eosinophils Absolute: 0.4 10*3/uL (ref 0.0–0.7)
HEMATOCRIT: 39.8 % (ref 39.0–52.0)
Hemoglobin: 13.3 g/dL (ref 13.0–17.0)
LYMPHS ABS: 2.1 10*3/uL (ref 0.7–4.0)
Lymphocytes Relative: 26.3 % (ref 12.0–46.0)
MCHC: 33.3 g/dL (ref 30.0–36.0)
MCV: 93 fl (ref 78.0–100.0)
MONOS PCT: 8.6 % (ref 3.0–12.0)
Monocytes Absolute: 0.7 10*3/uL (ref 0.1–1.0)
NEUTROS ABS: 4.7 10*3/uL (ref 1.4–7.7)
NEUTROS PCT: 59.6 % (ref 43.0–77.0)
PLATELETS: 164 10*3/uL (ref 150.0–400.0)
RBC: 4.28 Mil/uL (ref 4.22–5.81)
RDW: 15.5 % (ref 11.5–15.5)
WBC: 7.8 10*3/uL (ref 4.0–10.5)

## 2017-05-08 LAB — BASIC METABOLIC PANEL
BUN: 36 mg/dL — AB (ref 6–23)
CHLORIDE: 100 meq/L (ref 96–112)
CO2: 33 meq/L — AB (ref 19–32)
CREATININE: 2.14 mg/dL — AB (ref 0.40–1.50)
Calcium: 10.2 mg/dL (ref 8.4–10.5)
GFR: 31.51 mL/min — ABNORMAL LOW (ref >60.00)
Glucose, Bld: 188 mg/dL — ABNORMAL HIGH (ref 70–99)
POTASSIUM: 4.1 meq/L (ref 3.5–5.1)
Sodium: 138 mEq/L (ref 135–145)

## 2017-05-08 LAB — IBC PANEL
Iron: 98 ug/dL (ref 42–165)
SATURATION RATIOS: 25.9 % (ref 20.0–50.0)
Transferrin: 270 mg/dL (ref 212.0–360.0)

## 2017-05-08 LAB — MICROALBUMIN / CREATININE URINE RATIO
CREATININE, U: 30.1 mg/dL
Microalb Creat Ratio: 2.3 mg/g (ref 0.0–30.0)

## 2017-05-08 LAB — LIPID PANEL
CHOL/HDL RATIO: 4
CHOLESTEROL: 113 mg/dL (ref 0–200)
HDL: 31 mg/dL — ABNORMAL LOW (ref >39.00)
LDL CALC: 59 mg/dL (ref 0–99)
NonHDL: 82.43
Triglycerides: 119 mg/dL (ref 0.0–149.0)
VLDL: 23.8 mg/dL (ref 0.0–40.0)

## 2017-05-08 LAB — HEMOGLOBIN A1C: Hgb A1c MFr Bld: 7.7 % — ABNORMAL HIGH (ref 4.6–6.5)

## 2017-05-08 NOTE — Patient Instructions (Signed)
[name] , Thank you for taking time to come for your Medicare Wellness Visit. I appreciate your ongoing commitment to your health goals. Please review the following plan we discussed and let me know if I can assist you in the future.   These are the goals we discussed: Goals    . 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        This is a list of the screening recommended for you and due dates:  Health Maintenance  Topic Date Due  . Flu Shot  06/14/2017  . Complete foot exam   07/07/2017  . Hemoglobin A1C  11/07/2017  . Eye exam for diabetics  04/19/2018  . Urine Protein Check  05/08/2018  . Tetanus Vaccine  03/17/2019  . Pneumonia vaccines  Completed   Preventive Care for Adults  A healthy lifestyle and preventive care can promote health and wellness. Preventive health guidelines for adults include the following key practices.  . A routine yearly physical is a good way to check with your health care provider about your health and preventive screening. It is a chance to share any concerns and updates on your health and to receive a thorough exam.  . Visit your dentist for a routine exam and preventive care every 6 months. Brush your teeth twice a day and floss once a day. Good oral hygiene prevents tooth decay and gum disease.  . The frequency of eye exams is based on your age, health, family medical history, use  of contact lenses, and other factors. Follow your health care provider's ecommendations for frequency of eye exams.  . Eat a healthy diet. Foods like vegetables, fruits, whole grains, low-fat dairy products, and lean protein foods contain the nutrients you need without too many calories. Decrease your intake of foods high in solid fats, added sugars, and salt. Eat the right amount of calories for you. Get information about a proper diet from your health care  provider, if necessary.  . Regular physical exercise is one of the most important things you can do for your health. Most adults should get at least 150 minutes of moderate-intensity exercise (any activity that increases your heart rate and causes you to sweat) each week. In addition, most adults need muscle-strengthening exercises on 2 or more days a week.  Silver Sneakers may be a benefit available to you. To determine eligibility, you may visit the website: www.silversneakers.com or contact program at (276)715-7300 Mon-Fri between 8AM-8PM.   . Maintain a healthy weight. The body mass index (BMI) is a screening tool to identify possible weight problems. It provides an estimate of body fat based on height and weight. Your health care provider can find your BMI and can help you achieve or maintain a healthy weight.   For adults 20 years and older: ? A BMI below 18.5 is considered underweight. ? A BMI of 18.5 to 24.9 is normal. ? A BMI of 25 to 29.9 is considered overweight. ? A BMI of 30 and above is considered obese.   . Maintain normal blood lipids and cholesterol levels by exercising and minimizing your intake of saturated fat. Eat a balanced diet with plenty of fruit and vegetables. Blood tests for lipids and cholesterol should begin at age 42 and be repeated every 5 years. If your lipid or cholesterol levels are high, you are over  50, or you are at high risk for heart disease, you may need your cholesterol levels checked more frequently. Ongoing high lipid and cholesterol levels should be treated with medicines if diet and exercise are not working.  . If you smoke, find out from your health care provider how to quit. If you do not use tobacco, please do not start.  . If you choose to drink alcohol, please do not consume more than 2 drinks per day. One drink is considered to be 12 ounces (355 mL) of beer, 5 ounces (148 mL) of wine, or 1.5 ounces (44 mL) of liquor.  . If you are 46-79 years  old, ask your health care provider if you should take aspirin to prevent strokes.  . Use sunscreen. Apply sunscreen liberally and repeatedly throughout the day. You should seek shade when your shadow is shorter than you. Protect yourself by wearing long sleeves, pants, a wide-brimmed hat, and sunglasses year round, whenever you are outdoors.  . Once a month, do a whole body skin exam, using a mirror to look at the skin on your back. Tell your health care provider of new moles, moles that have irregular borders, moles that are larger than a pencil eraser, or moles that have changed in shape or color.

## 2017-05-08 NOTE — Progress Notes (Signed)
Subjective:   EDDER BELLANCA is a 83 y.o. male who presents for Medicare Annual/Subsequent preventive examination.  Review of Systems:  N/A Cardiac Risk Factors include: advanced age (>7men, >60 women);male gender;diabetes mellitus;dyslipidemia     Objective:    Vitals: BP 122/70 (BP Location: Right Arm, Patient Position: Sitting, Cuff Size: Normal)   Pulse 66   Temp 98.1 F (36.7 C) (Oral)   Ht 5\' 6"  (1.676 m) Comment: no shoes  Wt 196 lb 8 oz (89.1 kg)   SpO2 97%   BMI 31.72 kg/m   Body mass index is 31.72 kg/m.  Tobacco History  Smoking Status  . Former Smoker  . Packs/day: 1.00  . Years: 8.00  . Types: Cigarettes  Smokeless Tobacco  . Never Used    Comment: "quit smoking cigarettes in 1958"     Counseling given: No   Past Medical History:  Diagnosis Date  . Age-related macular degeneration, wet, both eyes ([place])   . Anemia    Secondary to acute blood loss  . Anginal pain ([place])   . Arthritis    "mild in hands, knees, ankles" (11/13/2015)  . Atrial fibrillation ([place])    Consideration was given for atrial flutter ablation, but patient developed atrial fibrillation. Cardioversion was done. Dr. Caryl Comes decided to watch him clinically. November, 2011  . Atrial flutter [facility]) 07/2010   September, 2011   Hospital with PNA and cath done.Marland KitchenMarland KitchenCoumadin.  Atrial flutter ablation planned, but  pt. then had atrial fibrillation,/outpatient conversion 09/08/10..NSR..plan to follow..Dr. Caryl Comes  . CAD (coronary artery disease)    Catheterization, September, 2011,  grafts patent from redo CABG,, medical therapy of coronary disease, consideration to proceeding with atrial flutter ablation  . Carotid artery disease ([place])    Doppler 09/18/2009 - 49% bilateral stenoses  . Carotid artery disease ([name])    49% bilateral, Doppler, November, 2010  . Chronic kidney disease (CKD), stage III (moderate)   . Diabetic peripheral neuropathy ([place])   . Diverticulosis of colon with hemorrhage 2009   several unit diverticular bleed   . Ejection fraction    EF 60%, echo, 2009  //   EF 65%, echo, September, 2011  . Erectile dysfunction    Mild  . Gout   . History of blood transfusion "several"   "related to diverticular bleeding"  . Hyperlipidemia   . Hypertension   . Mitral regurgitation    Mild, echo, September, 2011  . NSTEMI (non-ST elevated myocardial infarction) ([place]) 07/2010   at [facility] with repeat cath, rec medical mgmt   . OSA on CPAP   . Personal history of colonic polyps   . PNA (pneumonia) 9/11   NSTEMI at [facility] with repeat cath, rec medical mgmt   . RBBB (right bundle branch block)   . Shoulder pain    "positional; better now" (11/13/2015)  . Skin cancer    R lower leg, per derm 2012  . Type II diabetes mellitus ([place])    Past Surgical History:  Procedure Laterality Date  . CARDIAC CATHETERIZATION  2006   Nuclear..slight lateral ischemia..medical therapy  . CARDIAC CATHETERIZATION  08/04/2010   grafts patent from redo CABG...medical Rx and ablate Atrial flutter (LV not injected)   . CARDIOVERSION  ~ 2010  . CAROTID ENDARTERECTOMY Right 1994  . CATARACT EXTRACTION W/ INTRAOCULAR LENS  IMPLANT, BILATERAL Bilateral   . COLON RESECTION N/A 01/13/2016   Procedure: EXPLORATORY LAPAROTOMY, LEFT AND SIGMOID COLON REMOVAL;  Surgeon: Ralene Ok, MD;  Location: [facility]  OR;  Service: General;  Laterality: N/A;  Extended open left hemicolectomy and sigmoidectomy   . COLONOSCOPY N/A 11/14/2015   Procedure: COLONOSCOPY;  Surgeon: Manus Gunning, MD;  Location: [facility] ENDOSCOPY;  Service: Gastroenterology;  Laterality: N/A;  . COLONOSCOPY Left 01/05/2016   Procedure: COLONOSCOPY;  Surgeon: Manus Gunning, MD;  Location: [place];  Service: Gastroenterology;  Laterality: Left;  no sedation to start, moderate if needed  . COLONOSCOPY W/ POLYPECTOMY    . CORONARY ARTERY BYPASS GRAFT  1995; 2006   "X 3; X3"  . [place], 2006  . DOPPLER  ECHOCARDIOGRAPHY  08/2008   EF 60%  . DOPPLER ECHOCARDIOGRAPHY  08/02/2010   65-70%  . DOPPLER ECHOCARDIOGRAPHY  07/2010   MR mild  . ESOPHAGOGASTRODUODENOSCOPY N/A 06/19/2014   Procedure: ESOPHAGOGASTRODUODENOSCOPY (EGD);  Surgeon: Jerene Bears, MD;  Location: [facility] ENDOSCOPY;  Service: Endoscopy;  Laterality: N/A;  . ESOPHAGOGASTRODUODENOSCOPY N/A 11/14/2015   Procedure: ESOPHAGOGASTRODUODENOSCOPY (EGD);  Surgeon: Manus Gunning, MD;  Location: [place];  Service: Gastroenterology;  Laterality: N/A;  . FLEXIBLE SIGMOIDOSCOPY N/A 11/16/2015   Procedure: FLEXIBLE SIGMOIDOSCOPY;  Surgeon: Manus Gunning, MD;  Location: [place];  Service: Gastroenterology;  Laterality: N/A;  . LAPAROSCOPIC CHOLECYSTECTOMY  2008  . LEFT HEART CATH N/A 06/14/2014   Procedure: LEFT HEART CATH;  Surgeon: Sinclair Grooms, MD;  Location: [facility] CATH LAB;  Service: Cardiovascular;  Laterality: N/A;  . LEFT HEART CATHETERIZATION WITH CORONARY/GRAFT ANGIOGRAM N/A 06/11/2014   Procedure: LEFT HEART CATHETERIZATION WITH Beatrix Fetters;  Surgeon: Sinclair Grooms, MD;  Location: [facility] CATH LAB;  Service: Cardiovascular;  Laterality: N/A;  . PERCUTANEOUS CORONARY STENT INTERVENTION (PCI-S) N/A 06/13/2014   Procedure: PERCUTANEOUS CORONARY STENT INTERVENTION (PCI-S);  Surgeon: Sinclair Grooms, MD;  Location: [facility] CATH LAB;  Service: Cardiovascular;  Laterality: N/A;  . SKIN CANCER EXCISION Left 10/2015   calf  . SKIN CANCER EXCISION Right 2014?   chest  . TONSILLECTOMY    . VASECTOMY     Family History  Problem Relation Age of Onset  . Kidney disease Mother        Kidney failure  . Stroke Mother   . Diabetes Mother   . Heart disease Father        MI  . Arthritis Sister   . Cancer Sister        Throat  . Heart disease Sister        MI  . Diabetes Sister   . Prostate cancer Neg Hx   . Colon cancer Neg Hx    History  Sexual Activity  . Sexual activity: Yes    Outpatient Encounter  Prescriptions as of 05/08/2017  Medication Sig  . aspirin 81 MG tablet Take 81 mg by mouth daily.  . BD INSULIN SYRINGE ULTRAFINE 31G X 5/16" 0.3 ML MISC USE DAILY AS INSTRUCTED  . Cholecalciferol (VITAMIN D) 1000 UNITS capsule Take 1,000 Units by mouth daily.   . colchicine 0.6 MG tablet Take 1 tablet (0.6 mg total) by mouth daily as needed (gout flare up).  . fish oil-omega-3 fatty acids 1000 MG capsule Take 1 g by mouth daily.   . furosemide (LASIX) 80 MG tablet Take 1 tablet (80 mg total) by mouth daily.  [name] glucose blood (FREESTYLE LITE) test strip USE TO TEST BLOOD SUGAR ONCE DAILY AS ANS NEEDED FOR DM (DX. E11.40)  . isosorbide mononitrate (IMDUR) 60 MG 24 hr tablet Take 1  tablet (60 mg total) by mouth 2 (two) times daily with a meal.  . LANTUS 100 UNIT/ML injection INJECT 15 UNITS AT BEDTIME  . magnesium oxide (MAG-OX) 400 MG tablet Take 400 mg by mouth daily.   . metoprolol (LOPRESSOR) 100 MG tablet Take 0.5 tablets (50 mg total) by mouth 2 (two) times daily.  . Multiple Vitamin (MULTIVITAMIN WITH MINERALS) TABS Take 1 tablet by mouth daily.  . nitroGLYCERIN (NITROSTAT) 0.4 MG SL tablet Place 1 tablet (0.4 mg total) under the tongue every 5 (five) minutes as needed for chest pain.  [name] Potassium Gluconate (K-99) 595 MG CAPS Take 595 mg by mouth daily.   . rosuvastatin (CRESTOR) 20 MG tablet Take 1 tablet (20 mg total) by mouth daily.  . traMADol (ULTRAM) 50 MG tablet Take 1-2 tablets (50-100 mg total) by mouth every 12 (twelve) hours as needed.  . ranolazine (RANEXA) 500 MG 12 hr tablet Take 1 tablet (500 mg total) by mouth 2 (two) times daily. (Patient not taking: Reported on 05/08/2017)  . [DISCONTINUED] ferrous sulfate 325 (65 FE) MG EC tablet Take 1 tablet (325 mg total) by mouth daily.  . [DISCONTINUED] Triamcinolone Acetonide (TRIAMCINOLONE 0.1 % CREAM : EUCERIN) CREA Apply 1 application topically 2 (two) times daily.   No facility-administered encounter medications on file as of  05/08/2017.     Activities of Daily Living In your present state of health, do you have any difficulty performing the following activities: 05/08/2017  Hearing? Y  Vision? Y  Difficulty concentrating or making decisions? N  Walking or climbing stairs? Y  Dressing or bathing? N  Doing errands, shopping? N  Preparing Food and eating ? N  Using the Toilet? N  In the past six months, have you accidently leaked urine? N  Do you have problems with loss of bowel control? N  Managing your Medications? N  Managing your Finances? N  Housekeeping or managing your Housekeeping? N  Some recent data might be hidden    Patient Care Team: Tonia Ghent, MD as PCP - General Royann Shivers, MD as Referring Physician (Nephrology) [name], MD as Consulting Physician (Cardiology) Barbaraann Cao, [place] as Referring Physician (Optometry) Allyn Kenner, MD as Consulting Physician (Dermatology)   Assessment:    Hearing Screening Comments: Bilateral hearing aids Vision Screening Comments: Last vision exam in June 2018 with Dr. Gay Filler [facility]//Dr. Tempie Hoist is retina specialist  Exercise Activities and Dietary recommendations Current Exercise Habits: The patient does not participate in regular exercise at present, Exercise limited by: None identified  Goals    . 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       Fall Risk Fall Risk  05/08/2017 03/07/2016 09/08/2014 03/12/2013  Falls in the past year? No Yes Yes Yes  Number falls in past yr: - 1 1 1   Injury with Fall? - No - -  Risk for fall due to : - - Other (Comment) -  Follow up - Falls evaluation completed;Education provided - -   Depression Screen PHQ 2/9 Scores 05/08/2017 03/07/2016 09/08/2014 03/12/2013  PHQ - 2 Score 0 0 0 0    Cognitive Function MMSE - Mini Mental State Exam 05/08/2017 03/07/2016    Orientation to time 5 5  Orientation to Place 5 5  Registration 3 3  Attention/ Calculation 0 0  Recall  3 3  Language- name 2 objects 0 0  Language- repeat 1 1  Language- follow 3 step command 3 3  Language- read & follow direction 0 0  Write a sentence 0 0  Copy design 0 0  Total score 20 20         PLEASE NOTE: A Mini-Cog screen was completed. Maximum score is 20. A value of 0 denotes this part of [place] MMSE was not completed or the patient failed this part of the Mini-Cog screening.   Mini-Cog Screening Orientation to Time - Max 5 pts Orientation to Place - Max 5 pts Registration - Max 3 pts Recall - Max 3 pts Language Repeat - Max 1 pts Language Follow 3 Step Command - Max 3 pts     Immunization History  Administered Date(s) Administered  . Influenza Split 09/05/2011, 09/24/2012, 07/17/2014  . Influenza Whole 09/14/2006, 09/12/2007  . Influenza,inj,Quad PF,36+ Mos 09/19/2013, 08/31/2015, 07/07/2016  . Pneumococcal Conjugate-13 04/28/2015  . Pneumococcal Polysaccharide-23 09/14/2002  . Td 12/10/1998, 03/16/2009  . Zoster 10/04/2007   Screening Tests Health Maintenance  Topic Date Due  . INFLUENZA VACCINE  06/14/2017  . FOOT EXAM  07/07/2017  . HEMOGLOBIN A1C  11/07/2017  . OPHTHALMOLOGY EXAM  04/19/2018  . URINE MICROALBUMIN  05/08/2018  . TETANUS/TDAP  03/17/2019  . PNA vac Low Risk Adult  Completed      Plan:     I have personally reviewed and addressed the Medicare Annual Wellness questionnaire and have noted the following in the patient's chart:  A. Medical and social history B. Use of alcohol, tobacco or illicit drugs  C. Current medications and supplements D. Functional ability and status E.  Nutritional status F.  Physical activity G. Advance directives H. List of other physicians I.  Hospitalizations, surgeries, and ER visits in previous 12 months J.  [place] to include hearing, vision, cognitive, depression L. Referrals and  appointments - none  In addition, I have reviewed and discussed with patient certain preventive protocols, quality metrics, and best practice recommendations. A written personalized care plan for preventive services as well as general preventive health recommendations were provided to patient.  See attached scanned questionnaire for additional information.   Signed,   Lindell Noe, MHA, BS, LPN Health Coach

## 2017-05-08 NOTE — Progress Notes (Signed)
Pre visit review using our clinic review tool, if applicable. No additional management support is needed unless otherwise documented below in the visit note. 

## 2017-05-08 NOTE — Progress Notes (Signed)
PCP notes:   Health maintenance:  A1C - completed Urine microalbumin - completed Eye exam - per pt, exam in June 2018 Foot exam - PCP please address at next appt; due in 06/2017  Abnormal screenings:   None  Patient concerns:   Pt has not taken Ranexa in the past 2 wks due to pharmacy needing physician data. Pt encouraged to discuss this with cardiologist. Communication about Ranexa refill was sent by nurse to Dr. Gwenlyn Found.    Nurse concerns:  None  Next PCP appt:   05/23/17 @ 0845  I reviewed health advisor's note, was available for consultation on the day of service listed in this note, and agree with documentation and plan. [name], MD.

## 2017-05-23 ENCOUNTER — Encounter: Payer: Self-pay | Admitting: Family Medicine

## 2017-05-23 ENCOUNTER — Ambulatory Visit (INDEPENDENT_AMBULATORY_CARE_PROVIDER_SITE_OTHER): Payer: Medicare Other | Admitting: Family Medicine

## 2017-05-23 ENCOUNTER — Encounter: Payer: Self-pay | Admitting: Cardiovascular Disease

## 2017-05-23 ENCOUNTER — Ambulatory Visit (INDEPENDENT_AMBULATORY_CARE_PROVIDER_SITE_OTHER): Payer: Medicare Other | Admitting: Cardiovascular Disease

## 2017-05-23 VITALS — BP 122/58 | HR 80 | Temp 98.4°F | Ht 66.0 in | Wt 197.0 lb

## 2017-05-23 VITALS — BP 128/62 | HR 63 | Ht 66.0 in | Wt 199.0 lb

## 2017-05-23 DIAGNOSIS — M25519 Pain in unspecified shoulder: Secondary | ICD-10-CM | POA: Diagnosis not present

## 2017-05-23 DIAGNOSIS — Z7189 Other specified counseling: Secondary | ICD-10-CM

## 2017-05-23 DIAGNOSIS — I6523 Occlusion and stenosis of bilateral carotid arteries: Secondary | ICD-10-CM

## 2017-05-23 DIAGNOSIS — E785 Hyperlipidemia, unspecified: Secondary | ICD-10-CM | POA: Diagnosis not present

## 2017-05-23 DIAGNOSIS — I214 Non-ST elevation (NSTEMI) myocardial infarction: Secondary | ICD-10-CM

## 2017-05-23 DIAGNOSIS — Z951 Presence of aortocoronary bypass graft: Secondary | ICD-10-CM

## 2017-05-23 DIAGNOSIS — N183 Chronic kidney disease, stage 3 unspecified: Secondary | ICD-10-CM

## 2017-05-23 DIAGNOSIS — Z0000 Encounter for general adult medical examination without abnormal findings: Secondary | ICD-10-CM

## 2017-05-23 DIAGNOSIS — I480 Paroxysmal atrial fibrillation: Secondary | ICD-10-CM

## 2017-05-23 DIAGNOSIS — I119 Hypertensive heart disease without heart failure: Secondary | ICD-10-CM | POA: Diagnosis not present

## 2017-05-23 DIAGNOSIS — E1142 Type 2 diabetes mellitus with diabetic polyneuropathy: Secondary | ICD-10-CM

## 2017-05-23 DIAGNOSIS — I779 Disorder of arteries and arterioles, unspecified: Secondary | ICD-10-CM | POA: Diagnosis not present

## 2017-05-23 DIAGNOSIS — K439 Ventral hernia without obstruction or gangrene: Secondary | ICD-10-CM | POA: Diagnosis not present

## 2017-05-23 DIAGNOSIS — I739 Peripheral vascular disease, unspecified: Principal | ICD-10-CM

## 2017-05-23 MED ORDER — TRAMADOL HCL 50 MG PO TABS: 50.0000 mg | ORAL_TABLET | Freq: Two times a day (BID) | ORAL | 1 refills | Status: DC | PRN

## 2017-05-23 MED ORDER — METOPROLOL TARTRATE 25 MG PO TABS: 25.0000 mg | ORAL_TABLET | Freq: Two times a day (BID) | ORAL | 3 refills | Status: DC

## 2017-05-23 MED ORDER — RANOLAZINE ER 500 MG PO TB12: 500.0000 mg | ORAL_TABLET | Freq: Two times a day (BID) | ORAL | 1 refills | Status: DC

## 2017-05-23 MED ORDER — INSULIN GLARGINE 100 UNIT/ML ~~LOC~~ SOLN: SUBCUTANEOUS | 3 refills | Status: DC

## 2017-05-23 MED ORDER — INSULIN GLARGINE 100 UNIT/ML ~~LOC~~ SOLN: SUBCUTANEOUS | 1 refills | Status: DC

## 2017-05-23 NOTE — Assessment & Plan Note (Signed)
History of CAD status post coronary artery bypass grafting in 1996 and again in 2006 by [name]. He was hospitalized because of unstable angina late July 2015 and underwent cardiac catheterization by [name] revealing high-grade obtuse marginal branch vein graft stenosis which was stented. Because of recurrent chest pain retraction was performed just proximal to that. He was deemed to be a Plavix nonresponder and placed on Brilenta. He said no recurrent symptoms.

## 2017-05-23 NOTE — Progress Notes (Signed)
shingrix d/w pt.   Colon and prostate CA screening deferred.  D/w pt.  He agreed.  No blood in stool.  Advance directive d/w pt.  Daughter designated if patient were incapacitated.  ================================================= Diabetes:  Using medications without difficulties:yes Hypoglycemic episodes: no Hyperglycemic episodes: no Feet problems: some aches in the AM but better as the day goes on, some burning in the feet noted.   Blood Sugars averaging: usually 120-175 in the AM.  Diet was a little less strict recently.  Goal A1c ~7.5 or lower likely reasonable.   A1C - completed, d/w pt.  Slightly higher on this check.   Urine microalbumin - completed, d/w pt. neg Eye exam - per pt, exam in June 2018  CKD.  Cr stable.  D/w pt.  Still on lasix and baseline med.  MALB neg, d/ wpt.   Tramadol use, no ADE on med.  Shoulder pain improved with med.    Elevated Cholesterol: Using medications without problems:yes Muscle aches: some in the AM, better as the day goes one Diet compliance: encouraged Exercise: see ranexa conversation, limited recently  Hypertension:    Using medication without problems or lightheadedness: yes Chest pain with exertion:no Edema: some occ BLE, improved with lasix Short of breath: with exertion, see below re: ranexa.  Average home BPs: similar to today or lower.    He is going to see Dr. Gwenlyn Found this afternoon.  ED was worse after higher dose of ranexa.  However, off med his exertional tolerance is lower.  See prev notes about Ranexa, d/w pt.    He has macular degeneration per Dr. Zigmund Daniel and he has given up on driving.  D/w pt about safety, etc.    Midline abd wall hernia.  Not bothersome except for larger waist size with weight gain, it "pooches out" more.  Wearing suspenders for comfort.  He doesn't have emergent or obstructive sx.    PMH and SH reviewed  Meds, vitals, and allergies reviewed.   ROS: Per HPI unless specifically indicated in ROS  section   GEN: nad, alert and oriented HEENT: mucous membranes moist NECK: supple w/o LA CV: rrr with occ ectopy noted.   PULM: ctab, no inc wob ABD: soft, +bs, soft umbilical hernia noted.  EXT: trace BLE edema SKIN: no acute rash  Diabetic foot exam: Normal inspection No skin breakdown No calluses  Normal DP pulses Normal sensation to light touch and monofilament Nails normal

## 2017-05-23 NOTE — Assessment & Plan Note (Addendum)
D/w pt about A1c. Slightly higher on this check.  Diet was a little less strict recently.  Goal A1c ~7.5 or lower likely reasonable.   He will work on diet and recheck in a few months. He agrees.

## 2017-05-23 NOTE — Assessment & Plan Note (Signed)
History of chronic A. fib not on oral coagulation because of GI bleeding. He has had a hemicolectomy. He remains rate controlled.

## 2017-05-23 NOTE — Patient Instructions (Addendum)
Please ask Dr. Gwenlyn Found if he thinks you should be on a lower dose of metoprolol, along with the ranexa dose.   Take care.  Glad to see you.  Update me as needed.  Cut back on the chips and recheck A1c prior to a visit in about 4 months.

## 2017-05-23 NOTE — Assessment & Plan Note (Addendum)
Reaffirmed 05/23/17. D/w pt.  Advance directive d/w pt.  Daughter designated if patient were incapacitated.

## 2017-05-23 NOTE — Assessment & Plan Note (Addendum)
History of remote right carotid endarterectomy performed in 1994 by [name]. Recent duplex performed 11/25/16 revealing moderate right and mild left ICA stenosis. This will will be performed on an annual basis.

## 2017-05-23 NOTE — Assessment & Plan Note (Signed)
History of dyslipidemia on statin therapy with recent lipid profile performed 05/08/17 revealing total cholesterol 113, LDL 59 and HDL of 31.

## 2017-05-23 NOTE — Patient Instructions (Signed)
Medication Instructions: Decrease Metoprolol to 25 mg (1 tab) twice daily.   Follow-Up: Your physician wants you to follow-up in: 1 year with Dr. Gwenlyn Found. You will receive a reminder letter in the mail two months in advance. If you don't receive a letter, please call our office to schedule the follow-up appointment.  If you need a refill on your cardiac medications before your next appointment, please call your pharmacy.

## 2017-05-23 NOTE — Progress Notes (Signed)
05/23/2017 [name]   04/21/1932  [id number]  Primary Physician Tonia Ghent, MD Primary Cardiologist: [name] MD Renae Gloss  HPI:  [name] is a very pleasant 83 year old engaged Caucasian male formerly a patient of [name]. I last saw him in the office 04/12/16. [name] is the father of one daughter and one son , the grandfather to 2 grandchildren. 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 His past medical history is remarkable for coronary artery bypass grafting in 1996 and again in 2006 by [name]. He had remote right carotid endarterectomy by [name] in 1994. History of hypertension, hyperlipidemia and diabetes. He was hospitalized in late July 2015 because of unstable angina. [name] performed cardiac catheterization revealing a high-grade obtuse marginal branch vein graft stenosis which was stented. Because of recurrent chest pain he was re-intervention relates later and found to have a new hazy lesion just proximal to the previously placed stent which was again intervened on. He was deemed a Plavix nonresponder and was begun on Brilenta. He also has a remote history of PAF as well as diverticulitis and has had GI bleed on Coumadin. His oral anticoagulation was discontinued.  He was recently admitted on 11/13/15 for a GI bleed. He underwent endoscopy revealing gastritis, colonoscopy revealing bleeding diverticula which was treated with clipping. He had 3 units of packed red blood cell transfusion. He did have chest pain and positive enzymes after his endoscopy within the echo that showed an EF of 50% with a new anterior wall motion and reality. He was thought to have "a non-STEMI. His Plavix was discontinued. Because of his moderate renal insufficiency and his inability to take antiplatelet therapy he did not undergo repeat cardiac catheterization. He underwent left-sided hemicolectomy and  sigmoidectomy because of chronic GI bleeding. This was uneventful. His hemoglobin has remained stable He denies chest pain but does get easily fatigued. Since I saw him a year ago he's remained clinically stable and is relatively asymptomatic.   Current Outpatient Prescriptions  Medication Sig Dispense Refill  . aspirin 81 MG tablet Take 81 mg by mouth daily.    . BD INSULIN SYRINGE ULTRAFINE 31G X 5/16" 0.3 ML MISC USE DAILY AS INSTRUCTED 100 each 2  . Cholecalciferol (VITAMIN D) 1000 UNITS capsule Take 1,000 Units by mouth daily.     . colchicine 0.6 MG tablet Take 1 tablet (0.6 mg total) by mouth daily as needed (gout flare up).    . fish oil-omega-3 fatty acids 1000 MG capsule Take 1 g by mouth daily.     . furosemide (LASIX) 80 MG tablet Take 1 tablet (80 mg total) by mouth daily. 90 tablet 1  . glucose blood (FREESTYLE LITE) test strip USE TO TEST BLOOD SUGAR ONCE DAILY AS ANS NEEDED FOR DM (DX. E11.40) 300 each 0  . insulin glargine (LANTUS) 100 UNIT/ML injection INJECT 20-22 UNITS AT BEDTIME 40 mL 3  . isosorbide mononitrate (IMDUR) 60 MG 24 hr tablet Take 1 tablet (60 mg total) by mouth 2 (two) times daily with a meal. 180 tablet 1  . magnesium oxide (MAG-OX) 400 MG tablet Take 400 mg by mouth daily.     . metoprolol tartrate (LOPRESSOR) 25 MG tablet Take 1 tablet (25 mg total) by mouth 2 (two) times daily. 180 tablet 3  . Multiple Vitamin (MULTIVITAMIN WITH MINERALS) TABS Take 1 tablet by mouth daily.    [name]  nitroGLYCERIN (NITROSTAT) 0.4 MG SL tablet Place 1 tablet (0.4 mg total) under the tongue every 5 (five) minutes as needed for chest pain. 100 tablet 1  . Potassium Gluconate (K-99) 595 MG CAPS Take 595 mg by mouth daily.     . ranolazine (RANEXA) 500 MG 12 hr tablet Take 1 tablet (500 mg total) by mouth 2 (two) times daily. 90 tablet 1  . rosuvastatin (CRESTOR) 20 MG tablet Take 1 tablet (20 mg total) by mouth daily. 90 tablet 1  . traMADol (ULTRAM) 50 MG tablet Take 1-2 tablets  (50-100 mg total) by mouth every 12 (twelve) hours as needed. 360 tablet 1   No current facility-administered medications for this visit.     No Known Allergies  Social History   Social History  . Marital status: Widowed    Spouse name: N/A  . Number of children: 2  . Years of education: N/A   Occupational History  . 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 Smoking status: Former Smoker    Packs/day: 1.00    Years: 8.00    Types: Cigarettes  . Smokeless tobacco: Never Used     Comment: "quit smoking cigarettes in 1958"  . Alcohol use No  . Drug use: No  . Sexual activity: Yes   Other Topics Concern  . Not on file   Social History Narrative   From [place].  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     Lives with girlfriend [name].  Widowed 12/2005.     Review of Systems: General: negative for chills, fever, night sweats or weight changes.  Cardiovascular: negative for chest pain, dyspnea on exertion, edema, orthopnea, palpitations, paroxysmal nocturnal dyspnea or shortness of breath Dermatological: negative for rash Respiratory: negative for cough or wheezing Urologic: negative for hematuria Abdominal: negative for nausea, vomiting, diarrhea, bright red blood per rectum, melena, or hematemesis Neurologic: negative for visual changes, syncope, or dizziness All other systems reviewed and are otherwise negative except as noted above.    Blood pressure 128/62, pulse 63, height 5\' 6"  (1.676 m), weight 199 lb (90.3 kg).  General appearance: alert and no distress Neck: no adenopathy, no carotid bruit, no JVD, supple, symmetrical, trachea midline and thyroid not enlarged, symmetric, no tenderness/mass/nodules Lungs: clear to auscultation bilaterally Heart: irregularly irregular rhythm Extremities: extremities normal, atraumatic, no cyanosis or edema  EKG atrial fibrillation with a ventricular response of 63,  right bundle branch block and left anterior fascicular block (bifascicular block). I personally reviewed this EKG.  ASSESSMENT AND PLAN:   Hyperlipidemia History of dyslipidemia on statin therapy with recent lipid profile performed 05/08/17 revealing total cholesterol 113, LDL 59 and HDL of 31.  Carotid artery disease History of remote right carotid endarterectomy performed in 1994 by [name]. Recent duplex performed 11/25/16 revealing moderate right and mild left ICA stenosis. This will will be performed on an annual basis.  Hx of CABG History of CAD status post coronary artery bypass grafting in 1996 and again in 2006 by [name]. He was hospitalized because of unstable angina late July 2015 and underwent cardiac catheterization by [name] revealing high-grade obtuse marginal branch vein graft stenosis which was stented. Because of recurrent chest pain retraction was performed just proximal to that. He was deemed to be a Plavix nonresponder and placed on Brilenta. He said no recurrent symptoms.  Paroxysmal atrial fibrillation History of chronic A. fib not on  oral coagulation because of GI bleeding. He has had a hemicolectomy. He remains rate controlled.      [name] MD FACP,FACC,FAHA, [facility] 05/23/2017 3:14 PM

## 2017-05-24 DIAGNOSIS — Z0000 Encounter for general adult medical examination without abnormal findings: Secondary | ICD-10-CM | POA: Insufficient documentation

## 2017-05-24 DIAGNOSIS — K439 Ventral hernia without obstruction or gangrene: Secondary | ICD-10-CM | POA: Insufficient documentation

## 2017-05-24 NOTE — Assessment & Plan Note (Signed)
Advance directive d/w pt.  Daughter designated if patient were incapacitated.   

## 2017-05-24 NOTE — Assessment & Plan Note (Signed)
Improved with tramadol. Avoid NSAIDs.

## 2017-05-24 NOTE — Assessment & Plan Note (Signed)
shingrix d/w pt.   Colon and prostate CA screening deferred.  D/w pt.  He agreed.  No blood in stool.  Advance directive d/w pt.  Daughter designated if patient were incapacitated.

## 2017-05-24 NOTE — Progress Notes (Signed)
shingrix d/w pt.   Colon and prostate CA screening deferred.  D/w pt.  He agreed.  No blood in stool.  Advance directive d/w pt.  Daughter designated if patient were incapacitated.  ================================================= Diabetes:  Using medications without difficulties:yes Hypoglycemic episodes: no Hyperglycemic episodes: no Feet problems: some aches in the AM but better as the day goes on, some burning in the feet noted.   Blood Sugars averaging: usually 120-175 in the AM.  Diet was a little less strict recently.  Goal A1c ~7.5 or lower likely reasonable.   A1C - completed, d/w pt.  Slightly higher on this check.   Urine microalbumin - completed, d/w pt. neg Eye exam - per pt, exam in June 2018  CKD.  Cr stable.  D/w pt.  Still on lasix and baseline med.  MALB neg, d/ wpt.   Tramadol use, no ADE on med.  Shoulder pain improved with med.    Elevated Cholesterol: Using medications without problems:yes Muscle aches: some in the AM, better as the day goes one Diet compliance: encouraged Exercise: see ranexa conversation, limited recently  Hypertension:    Using medication without problems or lightheadedness: yes Chest pain with exertion:no Edema: some occ BLE, improved with lasix Short of breath: with exertion, see below re: ranexa.  Average home BPs: similar to today or lower.    He is going to see Dr. Gwenlyn Found this afternoon.  ED was worse after higher dose of ranexa.  However, off med his exertional tolerance is lower.  See prev notes about Ranexa, d/w pt.    He has macular degeneration per Dr. Zigmund Daniel and he has given up on driving.  D/w pt about safety, etc.    Midline abd wall hernia.  Not bothersome except for larger waist size with weight gain, it "pooches out" more.  Wearing suspenders for comfort.  He doesn't have emergent or obstructive sx.    PMH and SH reviewed  Meds, vitals, and allergies reviewed.   ROS: Per HPI unless specifically indicated in ROS  section   GEN: nad, alert and oriented HEENT: mucous membranes moist NECK: supple w/o LA CV: rrr with occ ectopy noted.   PULM: ctab, no inc wob ABD: soft, +bs, soft umbilical hernia noted.  EXT: trace BLE edema SKIN: no acute rash  Diabetic foot exam: Normal inspection No skin breakdown No calluses  Normal DP pulses Normal sensation to light touch and monofilament Nails normal

## 2017-05-24 NOTE — Assessment & Plan Note (Signed)
Reasonable control with statin. Continue as is. He agrees.

## 2017-05-24 NOTE — Assessment & Plan Note (Addendum)
We'll ask for cardiology input about his beta blocker dose and to help him get Ranexa . Unclear if he would benefit from a lower dose of beta blocker, since he is getting slightly short winded with exertion. Unclear how much of that is due to being off ranexa.

## 2017-05-24 NOTE — Assessment & Plan Note (Signed)
Cr stable.  D/w pt.  Still on lasix and baseline med.  MALB neg, d/ wpt.

## 2017-05-24 NOTE — Assessment & Plan Note (Signed)
Soft, anatomy d/w pt.  Wouldn't intervene except if he had sig sx, he agrees.

## 2017-05-25 ENCOUNTER — Encounter (INDEPENDENT_AMBULATORY_CARE_PROVIDER_SITE_OTHER): Payer: Medicare Other | Admitting: Ophthalmology

## 2017-05-25 DIAGNOSIS — I10 Essential (primary) hypertension: Secondary | ICD-10-CM

## 2017-05-25 DIAGNOSIS — E113293 Type 2 diabetes mellitus with mild nonproliferative diabetic retinopathy without macular edema, bilateral: Secondary | ICD-10-CM

## 2017-05-25 DIAGNOSIS — H35033 Hypertensive retinopathy, bilateral: Secondary | ICD-10-CM

## 2017-05-25 DIAGNOSIS — H43813 Vitreous degeneration, bilateral: Secondary | ICD-10-CM

## 2017-05-25 DIAGNOSIS — E11319 Type 2 diabetes mellitus with unspecified diabetic retinopathy without macular edema: Secondary | ICD-10-CM

## 2017-05-25 DIAGNOSIS — H4423 Degenerative myopia, bilateral: Secondary | ICD-10-CM | POA: Diagnosis not present

## 2017-05-26 ENCOUNTER — Other Ambulatory Visit: Payer: Self-pay | Admitting: *Deleted

## 2017-05-29 ENCOUNTER — Other Ambulatory Visit: Payer: Self-pay | Admitting: Cardiovascular Disease

## 2017-06-09 ENCOUNTER — Other Ambulatory Visit: Payer: Self-pay

## 2017-08-01 DIAGNOSIS — E889 Metabolic disorder, unspecified: Secondary | ICD-10-CM | POA: Diagnosis not present

## 2017-08-01 DIAGNOSIS — N183 Chronic kidney disease, stage 3 (moderate): Secondary | ICD-10-CM | POA: Diagnosis not present

## 2017-08-01 DIAGNOSIS — I10 Essential (primary) hypertension: Secondary | ICD-10-CM | POA: Diagnosis not present

## 2017-08-01 DIAGNOSIS — E669 Obesity, unspecified: Secondary | ICD-10-CM | POA: Diagnosis not present

## 2017-08-01 DIAGNOSIS — D631 Anemia in chronic kidney disease: Secondary | ICD-10-CM | POA: Diagnosis not present

## 2017-08-01 DIAGNOSIS — M9080 Osteopathy in diseases classified elsewhere, unspecified site: Secondary | ICD-10-CM | POA: Diagnosis not present

## 2017-08-07 ENCOUNTER — Other Ambulatory Visit: Payer: Self-pay | Admitting: *Deleted

## 2017-08-07 MED ORDER — ROSUVASTATIN CALCIUM 20 MG PO TABS: 20.0000 mg | ORAL_TABLET | Freq: Every day | ORAL | 1 refills | Status: DC

## 2017-08-07 NOTE — Telephone Encounter (Signed)
Faxed refill request. Rosuvastatin Last office visit:   05/23/17 Last Filled:    90 tablet 1 11/28/2016

## 2017-08-08 ENCOUNTER — Other Ambulatory Visit: Payer: Self-pay

## 2017-08-08 MED ORDER — RANOLAZINE ER 500 MG PO TB12: 500.0000 mg | ORAL_TABLET | Freq: Two times a day (BID) | ORAL | 2 refills | Status: DC

## 2017-08-29 ENCOUNTER — Encounter (INDEPENDENT_AMBULATORY_CARE_PROVIDER_SITE_OTHER): Payer: Medicare Other | Admitting: Ophthalmology

## 2017-08-29 ENCOUNTER — Other Ambulatory Visit: Payer: Self-pay | Admitting: Cardiovascular Disease

## 2017-08-29 DIAGNOSIS — H4423 Degenerative myopia, bilateral: Secondary | ICD-10-CM

## 2017-08-29 DIAGNOSIS — H43813 Vitreous degeneration, bilateral: Secondary | ICD-10-CM

## 2017-08-29 DIAGNOSIS — E113212 Type 2 diabetes mellitus with mild nonproliferative diabetic retinopathy with macular edema, left eye: Secondary | ICD-10-CM | POA: Diagnosis not present

## 2017-08-29 DIAGNOSIS — E11311 Type 2 diabetes mellitus with unspecified diabetic retinopathy with macular edema: Secondary | ICD-10-CM | POA: Diagnosis not present

## 2017-08-29 DIAGNOSIS — E113291 Type 2 diabetes mellitus with mild nonproliferative diabetic retinopathy without macular edema, right eye: Secondary | ICD-10-CM

## 2017-08-29 DIAGNOSIS — H35033 Hypertensive retinopathy, bilateral: Secondary | ICD-10-CM | POA: Diagnosis not present

## 2017-08-29 DIAGNOSIS — I10 Essential (primary) hypertension: Secondary | ICD-10-CM

## 2017-08-29 DIAGNOSIS — H318 Other specified disorders of choroid: Secondary | ICD-10-CM | POA: Diagnosis not present

## 2017-11-21 ENCOUNTER — Encounter (INDEPENDENT_AMBULATORY_CARE_PROVIDER_SITE_OTHER): Payer: Medicare Other | Admitting: Ophthalmology

## 2017-11-30 ENCOUNTER — Encounter (INDEPENDENT_AMBULATORY_CARE_PROVIDER_SITE_OTHER): Payer: Medicare Other | Admitting: Ophthalmology

## 2017-11-30 DIAGNOSIS — H318 Other specified disorders of choroid: Secondary | ICD-10-CM

## 2017-11-30 DIAGNOSIS — H4423 Degenerative myopia, bilateral: Secondary | ICD-10-CM

## 2017-11-30 DIAGNOSIS — I10 Essential (primary) hypertension: Secondary | ICD-10-CM

## 2017-11-30 DIAGNOSIS — H35033 Hypertensive retinopathy, bilateral: Secondary | ICD-10-CM

## 2017-11-30 DIAGNOSIS — H43813 Vitreous degeneration, bilateral: Secondary | ICD-10-CM | POA: Diagnosis not present

## 2017-12-20 ENCOUNTER — Ambulatory Visit (HOSPITAL_COMMUNITY)
Admission: RE | Admit: 2017-12-20 | Discharge: 2017-12-20 | Disposition: A | Discharge status: Home / Self Care | Payer: Medicare Other | Source: Ambulatory Visit | Attending: Cardiovascular Disease | Admitting: Cardiovascular Disease

## 2017-12-20 ENCOUNTER — Telehealth: Payer: Self-pay | Admitting: *Deleted

## 2017-12-20 DIAGNOSIS — I779 Disorder of arteries and arterioles, unspecified: Secondary | ICD-10-CM

## 2017-12-20 DIAGNOSIS — I739 Peripheral vascular disease, unspecified: Secondary | ICD-10-CM

## 2017-12-20 DIAGNOSIS — I6523 Occlusion and stenosis of bilateral carotid arteries: Secondary | ICD-10-CM | POA: Diagnosis not present

## 2017-12-20 MED ORDER — ISOSORBIDE MONONITRATE ER 60 MG PO TB24: 60.0000 mg | ORAL_TABLET | Freq: Two times a day (BID) | ORAL | 1 refills | Status: DC

## 2017-12-20 MED ORDER — RANOLAZINE ER 500 MG PO TB12: 500.0000 mg | ORAL_TABLET | Freq: Two times a day (BID) | ORAL | 1 refills | Status: DC

## 2017-12-20 MED ORDER — METOPROLOL TARTRATE 25 MG PO TABS: 25.0000 mg | ORAL_TABLET | Freq: Two times a day (BID) | ORAL | 3 refills | Status: DC

## 2017-12-20 NOTE — Telephone Encounter (Signed)
Patient came in requesting medication refills on Metoprolol, Isosorbide and Ranxea. They have been sent into [facility] per his request.

## 2017-12-27 ENCOUNTER — Other Ambulatory Visit: Payer: Self-pay | Admitting: Cardiovascular Disease

## 2017-12-27 DIAGNOSIS — I6523 Occlusion and stenosis of bilateral carotid arteries: Secondary | ICD-10-CM

## 2018-02-03 ENCOUNTER — Other Ambulatory Visit: Payer: Self-pay | Admitting: Family Medicine

## 2018-02-19 ENCOUNTER — Telehealth: Payer: Self-pay | Admitting: Family Medicine

## 2018-02-19 NOTE — Telephone Encounter (Signed)
Tramadol last refill 05/23/17 #360/1

## 2018-02-19 NOTE — Telephone Encounter (Signed)
Copied from [place] [id number]. Topic: Quick Communication - Rx Refill/Question >> Feb 19, 2018  2:56 PM Waylan Rocher, Lumin L wrote: Medication: glucose blood (FREESTYLE LITE) test strip traMADol (ULTRAM) 50 MG tablet Has the patient contacted their pharmacy? Yes.   (Agent: If no, request that the patient contact the pharmacy for the refill.) Preferred Pharmacy (with phone number or street name): [facility], [place] [address] [place] [address] Phone: [phone] Fax: [phone] Agent: Please be advised that RX refills may take up to 3 business days. We ask that you follow-up with your pharmacy.

## 2018-02-19 NOTE — Telephone Encounter (Signed)
Pt requesting refills for Freestyle lite test strips, prescription expired 05/02/17 and for tramadol, this prescription expired on 11/19/17.  Provider: [name], MD Pharmacy  [facility]  05/23/17 NOV  05/24/18  Please review.

## 2018-02-20 ENCOUNTER — Telehealth: Payer: Self-pay | Admitting: Family Medicine

## 2018-02-20 MED ORDER — TRAMADOL HCL 50 MG PO TABS: 50.0000 mg | ORAL_TABLET | Freq: Two times a day (BID) | ORAL | 0 refills | Status: DC | PRN

## 2018-02-20 NOTE — Telephone Encounter (Signed)
Copied from [place] [phone] [id number]. Topic: Quick Communication - Rx Refill/Question >> Feb 20, 2018 11:43 AM Neva Seat wrote: traMADol Veatrice Bourbon) 50 MG tablet I [facility] has a question on the Rx refill.  Please call them back at: [phone]  Ref [id number]

## 2018-02-20 NOTE — Telephone Encounter (Signed)
Sent.  He is overdue for A1c, ordered.  Please get that done at lab visit.  Thanks.

## 2018-02-20 NOTE — Telephone Encounter (Signed)
Tried pharmacy at 518pm - they were closed.  I will not be in the office tomorrow so will forward this to Dr. Josefine Class CMA for someone to call tomorrow.  Thanks.

## 2018-02-20 NOTE — Addendum Note (Signed)
Addended by: Tonia Ghent on: 02/20/2018 06:09 AM   Modules accepted: Orders

## 2018-02-20 NOTE — Telephone Encounter (Signed)
Patient advised.  Lab appt scheduled.  

## 2018-02-21 ENCOUNTER — Other Ambulatory Visit (INDEPENDENT_AMBULATORY_CARE_PROVIDER_SITE_OTHER): Payer: Medicare Other

## 2018-02-21 DIAGNOSIS — E1142 Type 2 diabetes mellitus with diabetic polyneuropathy: Secondary | ICD-10-CM | POA: Diagnosis not present

## 2018-02-21 LAB — POCT GLYCOSYLATED HEMOGLOBIN (HGB A1C): Hemoglobin A1C: 7.5

## 2018-02-21 NOTE — Telephone Encounter (Signed)
Spoke to Judson Roch with [facility] and was advised that they received a script for #360 tablets. Judson Roch stated that the patient has not filled a script in over 6 months. Judson Roch stated that when there is a large gap in the fill of a script they like to call and see if the quantity can be change to be less and more in line with usage   Pharmacy is requesting #180 count which would be a 90 day supply. Judson Roch stated that a new script can be sent in if Dr. Damita Dunnings is in agreement with their request.

## 2018-02-21 NOTE — Telephone Encounter (Signed)
Larene Beach with [facility] notified as instructed by telephone and verbalized understanding.

## 2018-02-21 NOTE — Telephone Encounter (Signed)
He can take up to 2 BID for 4 tabs a day.  I am okay with him getting 180 at a time.  Please give them the order and notify pt.  Thanks.

## 2018-02-27 DIAGNOSIS — N183 Chronic kidney disease, stage 3 (moderate): Secondary | ICD-10-CM | POA: Diagnosis not present

## 2018-02-27 DIAGNOSIS — E889 Metabolic disorder, unspecified: Secondary | ICD-10-CM | POA: Diagnosis not present

## 2018-02-27 DIAGNOSIS — N189 Chronic kidney disease, unspecified: Secondary | ICD-10-CM | POA: Diagnosis not present

## 2018-03-06 ENCOUNTER — Telehealth: Payer: Self-pay

## 2018-03-06 ENCOUNTER — Telehealth: Payer: Self-pay | Admitting: Family Medicine

## 2018-03-06 MED ORDER — GLUCOSE BLOOD VI STRP: ORAL_STRIP | 0 refills | Status: DC

## 2018-03-06 NOTE — Telephone Encounter (Signed)
Pt would like to change RX Metoprolol from 25mg /daily to 50mg /daily. Please address Thank You.

## 2018-03-06 NOTE — Telephone Encounter (Signed)
Copied from [place] [phone]. Topic: Quick Communication - Rx Refill/Question >> Mar 06, 2018  3:54 PM Ether Griffins B wrote: Medication: glucose blood (FREESTYLE LITE) test strip   Pt is out of the test strips 3 or 4 days.   Has the patient contacted their pharmacy? Yes.   (Agent: If no, request that the patient contact the pharmacy for the refill.) Preferred Pharmacy (with phone number or street name): [place] Agent: Please be advised that RX refills may take up to 3 business days. We ask that you follow-up with your pharmacy.

## 2018-03-07 DIAGNOSIS — M9080 Osteopathy in diseases classified elsewhere, unspecified site: Secondary | ICD-10-CM | POA: Diagnosis not present

## 2018-03-07 DIAGNOSIS — D631 Anemia in chronic kidney disease: Secondary | ICD-10-CM | POA: Diagnosis not present

## 2018-03-07 DIAGNOSIS — I129 Hypertensive chronic kidney disease with stage 1 through stage 4 chronic kidney disease, or unspecified chronic kidney disease: Secondary | ICD-10-CM | POA: Diagnosis not present

## 2018-03-07 DIAGNOSIS — N183 Chronic kidney disease, stage 3 (moderate): Secondary | ICD-10-CM | POA: Diagnosis not present

## 2018-03-07 DIAGNOSIS — E889 Metabolic disorder, unspecified: Secondary | ICD-10-CM | POA: Diagnosis not present

## 2018-03-14 NOTE — Telephone Encounter (Signed)
That is fine with me.

## 2018-03-15 ENCOUNTER — Encounter: Payer: Self-pay | Admitting: Family Medicine

## 2018-03-15 LAB — LAB REPORT - SCANNED
BUN: 33 — AB (ref 4–21)
CREATININE: 2.14
Creatinine, Ser: 2.1
Glucose: 241
HEMOGLOBIN: 12.1
VIT D 25 HYDROXY: 50.6

## 2018-03-20 MED ORDER — METOPROLOL TARTRATE 50 MG PO TABS: 50.0000 mg | ORAL_TABLET | Freq: Two times a day (BID) | ORAL | 3 refills | Status: DC

## 2018-03-20 MED ORDER — METOPROLOL TARTRATE 50 MG PO TABS: 50.0000 mg | ORAL_TABLET | Freq: Two times a day (BID) | ORAL | 0 refills | Status: DC

## 2018-03-20 NOTE — Telephone Encounter (Signed)
PATIENT STATES HE HAS RUN OUT OF METOPROLOL  TARTRATE.NEEDS A  PRESCRIPTION SENT TO LOCAL PHARMACY AND MAIL ORDER.  RN E-SENT BOTH

## 2018-03-21 ENCOUNTER — Encounter (INDEPENDENT_AMBULATORY_CARE_PROVIDER_SITE_OTHER): Payer: Medicare Other | Admitting: Ophthalmology

## 2018-03-21 DIAGNOSIS — H35033 Hypertensive retinopathy, bilateral: Secondary | ICD-10-CM | POA: Diagnosis not present

## 2018-03-21 DIAGNOSIS — I10 Essential (primary) hypertension: Secondary | ICD-10-CM | POA: Diagnosis not present

## 2018-03-21 DIAGNOSIS — H43813 Vitreous degeneration, bilateral: Secondary | ICD-10-CM

## 2018-03-21 DIAGNOSIS — H4423 Degenerative myopia, bilateral: Secondary | ICD-10-CM

## 2018-03-21 DIAGNOSIS — H318 Other specified disorders of choroid: Secondary | ICD-10-CM

## 2018-04-04 DIAGNOSIS — X32XXXD Exposure to sunlight, subsequent encounter: Secondary | ICD-10-CM | POA: Diagnosis not present

## 2018-04-04 DIAGNOSIS — L570 Actinic keratosis: Secondary | ICD-10-CM | POA: Diagnosis not present

## 2018-04-04 DIAGNOSIS — Z1283 Encounter for screening for malignant neoplasm of skin: Secondary | ICD-10-CM | POA: Diagnosis not present

## 2018-04-04 DIAGNOSIS — Z08 Encounter for follow-up examination after completed treatment for malignant neoplasm: Secondary | ICD-10-CM | POA: Diagnosis not present

## 2018-04-04 DIAGNOSIS — C44519 Basal cell carcinoma of skin of other part of trunk: Secondary | ICD-10-CM | POA: Diagnosis not present

## 2018-04-04 DIAGNOSIS — L821 Other seborrheic keratosis: Secondary | ICD-10-CM | POA: Diagnosis not present

## 2018-04-04 DIAGNOSIS — Z85820 Personal history of malignant melanoma of skin: Secondary | ICD-10-CM | POA: Diagnosis not present

## 2018-04-16 ENCOUNTER — Other Ambulatory Visit: Payer: Self-pay | Admitting: Cardiovascular Disease

## 2018-04-17 NOTE — Telephone Encounter (Signed)
Rx sent to pharmacy   

## 2018-05-02 ENCOUNTER — Other Ambulatory Visit (INDEPENDENT_AMBULATORY_CARE_PROVIDER_SITE_OTHER): Payer: Medicare Other

## 2018-05-02 DIAGNOSIS — E1142 Type 2 diabetes mellitus with diabetic polyneuropathy: Secondary | ICD-10-CM | POA: Diagnosis not present

## 2018-05-02 LAB — HEMOGLOBIN A1C: HEMOGLOBIN A1C: 7.5 % — AB (ref 4.6–6.5)

## 2018-05-07 ENCOUNTER — Other Ambulatory Visit: Payer: Self-pay | Admitting: Family Medicine

## 2018-05-07 DIAGNOSIS — E1142 Type 2 diabetes mellitus with diabetic polyneuropathy: Secondary | ICD-10-CM

## 2018-05-08 DIAGNOSIS — L570 Actinic keratosis: Secondary | ICD-10-CM | POA: Diagnosis not present

## 2018-05-08 DIAGNOSIS — X32XXXD Exposure to sunlight, subsequent encounter: Secondary | ICD-10-CM | POA: Diagnosis not present

## 2018-05-08 DIAGNOSIS — I872 Venous insufficiency (chronic) (peripheral): Secondary | ICD-10-CM | POA: Diagnosis not present

## 2018-05-08 DIAGNOSIS — Z85828 Personal history of other malignant neoplasm of skin: Secondary | ICD-10-CM | POA: Diagnosis not present

## 2018-05-08 DIAGNOSIS — Z08 Encounter for follow-up examination after completed treatment for malignant neoplasm: Secondary | ICD-10-CM | POA: Diagnosis not present

## 2018-05-09 ENCOUNTER — Ambulatory Visit (INDEPENDENT_AMBULATORY_CARE_PROVIDER_SITE_OTHER): Payer: Medicare Other

## 2018-05-09 ENCOUNTER — Ambulatory Visit: Payer: Medicare Other

## 2018-05-09 VITALS — BP 110/70 | HR 61 | Temp 98.1°F | Ht 67.0 in | Wt 201.2 lb

## 2018-05-09 DIAGNOSIS — Z0000 Encounter for general adult medical examination without abnormal findings: Secondary | ICD-10-CM | POA: Diagnosis not present

## 2018-05-09 DIAGNOSIS — E1142 Type 2 diabetes mellitus with diabetic polyneuropathy: Secondary | ICD-10-CM

## 2018-05-09 LAB — LIPID PANEL
CHOLESTEROL: 105 mg/dL (ref 0–200)
HDL: 29.3 mg/dL — ABNORMAL LOW (ref >39.00)
LDL CALC: 57 mg/dL (ref 0–99)
NonHDL: 75.41
TRIGLYCERIDES: 94 mg/dL (ref 0.0–149.0)
Total CHOL/HDL Ratio: 4
VLDL: 18.8 mg/dL (ref 0.0–40.0)

## 2018-05-09 LAB — URIC ACID: Uric Acid, Serum: 8.8 mg/dL — ABNORMAL HIGH (ref 4.0–7.8)

## 2018-05-09 LAB — MICROALBUMIN / CREATININE URINE RATIO
Creatinine,U: 91.9 mg/dL
MICROALB UR: 6.9 mg/dL — AB (ref 0.0–1.9)
MICROALB/CREAT RATIO: 7.5 mg/g (ref 0.0–30.0)

## 2018-05-09 NOTE — Progress Notes (Signed)
   Subjective:    Patient ID: [name], male    DOB: October 31, 1932, 84 y.o.   MRN: [id number]  HPI I reviewed health advisor's note, was available for consultation, and agree with documentation and plan.    Review of Systems     Objective:   Physical Exam         Assessment & Plan:

## 2018-05-09 NOTE — Progress Notes (Signed)
Subjective:   [name] is a 84 y.o. male who presents for Medicare Annual/Subsequent preventive examination.  Review of Systems:  N/A Cardiac Risk Factors include: advanced age (>74men, >42 women);male gender;diabetes mellitus;dyslipidemia     Objective:    Vitals: BP 110/70 (BP Location: Right Arm, Patient Position: Sitting, Cuff Size: Normal)   Pulse 61   Temp 98.1 F (36.7 C) (Oral)   Ht 5\' 7"  (1.702 m)   Wt 201 lb 4 oz (91.3 kg)   SpO2 98%   BMI 31.52 kg/m   Body mass index is 31.52 kg/m.  Advanced Directives 05/09/2018 05/08/2017 03/07/2016 01/03/2016 11/13/2015 11/13/2015 06/09/2014  Does Patient Have a Medical Advance Directive? Yes Yes Yes No Yes Yes Patient does not have advance directive  Type of Advance Directive [place];Living will [place];Living will [place];Living will - - [place];Living will;Mental Health Advance Directive -  Does patient want to make changes to medical advance directive? - - No - Patient declined - No - Patient declined - -  Copy of [place] in Chart? No - copy requested Yes No - copy requested - No - copy requested No - copy requested -  Pre-existing out of facility DNR order (yellow form or pink MOST form) - - - - - - -    Tobacco Social History   Tobacco Use  Smoking Status Former Smoker  . Packs/day: 1.00  . Years: 8.00  . Pack years: 8.00  . Types: Cigarettes  Smokeless Tobacco Never Used  Tobacco Comment   "quit smoking cigarettes in 1958"     Counseling given: No Comment: "quit smoking cigarettes in 1958"   Clinical Intake:  Pre-visit preparation completed: Yes  Pain : No/denies pain Pain Score: 0-No pain     Nutritional Status: BMI > 30  Obese Nutritional Risks: None Diabetes: Yes CBG done?: No Did pt. bring in CBG monitor from home?: No  How often do you need to have someone help you when you read  instructions, pamphlets, or other written materials from your doctor or pharmacy?: 1 - Never What is the last grade level you completed in school?: 12th grade + some college  Interpreter Needed?: No  Comments: pt lives with spouse Information entered by :: LPinson, LPN  Past Medical History:  Diagnosis Date  . Age-related macular degeneration, wet, both eyes ([place])   . Anemia    Secondary to acute blood loss  . Anginal pain ([place])   . Arthritis    "mild in hands, knees, ankles" (11/13/2015)  . Atrial fibrillation ([place])    Consideration was given for atrial flutter ablation, but patient developed atrial fibrillation. Cardioversion was done. Dr. Caryl Comes decided to watch him clinically. November, 2011  . Atrial flutter [facility]) 07/2010   September, 2011   Hospital with PNA and cath done.Marland KitchenMarland KitchenCoumadin.  Atrial flutter ablation planned, but  pt. then had atrial fibrillation,/outpatient conversion 09/08/10..NSR..plan to follow..Dr. Caryl Comes  . CAD (coronary artery disease)    Catheterization, September, 2011,  grafts patent from redo CABG,, medical therapy of coronary disease, consideration to proceeding with atrial flutter ablation  . Carotid artery disease ([place])    Doppler 09/18/2009 - 49% bilateral stenoses  . Carotid artery disease ([name])    49% bilateral, Doppler, November, 2010  . Chronic kidney disease (CKD), stage III (moderate) ([name])   . Diabetic peripheral neuropathy ([place])   . Diverticulosis of colon with hemorrhage 2009  several unit diverticular bleed   . Ejection fraction    EF 60%, echo, 2009  //   EF 65%, echo, September, 2011  . Erectile dysfunction    Mild  . Gout   . History of blood transfusion "several"   related to diverticular bleeding  . Hyperlipidemia   . Hypertension   . Mitral regurgitation    Mild, echo, September, 2011  . NSTEMI (non-ST elevated myocardial infarction) ([place]) 07/2010   at [facility] with repeat cath, rec medical mgmt   . OSA on CPAP   . Personal history of  colonic polyps   . PNA (pneumonia) 9/11   NSTEMI at [facility] with repeat cath, rec medical mgmt   . RBBB (right bundle branch block)   . Shoulder pain    "positional; better now" (11/13/2015)  . Skin cancer    R lower leg, per derm 2012  . Type II diabetes mellitus ([place])    Past Surgical History:  Procedure Laterality Date  . CARDIAC CATHETERIZATION  2006   Nuclear..slight lateral ischemia..medical therapy  . CARDIAC CATHETERIZATION  08/04/2010   grafts patent from redo CABG...medical Rx and ablate Atrial flutter (LV not injected)   . CARDIOVERSION  ~ 2010  . CAROTID ENDARTERECTOMY Right 1994  . CATARACT EXTRACTION W/ INTRAOCULAR LENS  IMPLANT, BILATERAL Bilateral   . COLON RESECTION N/A 01/13/2016   Procedure: EXPLORATORY LAPAROTOMY, LEFT AND SIGMOID COLON REMOVAL;  Surgeon: Ralene Ok, MD;  Location: [place];  Service: General;  Laterality: N/A;  Extended open left hemicolectomy and sigmoidectomy   . COLONOSCOPY N/A 11/14/2015   Procedure: COLONOSCOPY;  Surgeon: Manus Gunning, MD;  Location: [facility] ENDOSCOPY;  Service: Gastroenterology;  Laterality: N/A;  . COLONOSCOPY Left 01/05/2016   Procedure: COLONOSCOPY;  Surgeon: Manus Gunning, MD;  Location: [place];  Service: Gastroenterology;  Laterality: Left;  no sedation to start, moderate if needed  . COLONOSCOPY W/ POLYPECTOMY    . CORONARY ARTERY BYPASS GRAFT  1995; 2006   "X 3; X3"  . [place], 2006  . DOPPLER ECHOCARDIOGRAPHY  08/2008   EF 60%  . DOPPLER ECHOCARDIOGRAPHY  08/02/2010   65-70%  . DOPPLER ECHOCARDIOGRAPHY  07/2010   MR mild  . ESOPHAGOGASTRODUODENOSCOPY N/A 06/19/2014   Procedure: ESOPHAGOGASTRODUODENOSCOPY (EGD);  Surgeon: Jerene Bears, MD;  Location: [facility] ENDOSCOPY;  Service: Endoscopy;  Laterality: N/A;  . ESOPHAGOGASTRODUODENOSCOPY N/A 11/14/2015   Procedure: ESOPHAGOGASTRODUODENOSCOPY (EGD);  Surgeon: Manus Gunning, MD;  Location: [place];  Service:  Gastroenterology;  Laterality: N/A;  . FLEXIBLE SIGMOIDOSCOPY N/A 11/16/2015   Procedure: FLEXIBLE SIGMOIDOSCOPY;  Surgeon: Manus Gunning, MD;  Location: [place];  Service: Gastroenterology;  Laterality: N/A;  . LAPAROSCOPIC CHOLECYSTECTOMY  2008  . LEFT HEART CATH N/A 06/14/2014   Procedure: LEFT HEART CATH;  Surgeon: Sinclair Grooms, MD;  Location: [facility] CATH LAB;  Service: Cardiovascular;  Laterality: N/A;  . LEFT HEART CATHETERIZATION WITH CORONARY/GRAFT ANGIOGRAM N/A 06/11/2014   Procedure: LEFT HEART CATHETERIZATION WITH Beatrix Fetters;  Surgeon: Sinclair Grooms, MD;  [facility] LAB;  Service: Cardiovascular;  Laterality: N/A;  . PERCUTANEOUS CORONARY STENT INTERVENTION (PCI-S) N/A 06/13/2014   Procedure: PERCUTANEOUS CORONARY STENT INTERVENTION (PCI-S);  Surgeon: Sinclair Grooms, MD;  Location: [facility] CATH LAB;  Service: Cardiovascular;  Laterality: N/A;  . SKIN CANCER EXCISION Left 10/2015   calf  . SKIN CANCER EXCISION Right 2014?   chest  . TONSILLECTOMY    .  VASECTOMY     Family History  Problem Relation Age of Onset  . Kidney disease Mother        Kidney failure  . Stroke Mother   . Diabetes Mother   . Heart disease Father        MI  . Arthritis Sister   . Cancer Sister        Throat  . Heart disease Sister        MI  . Diabetes Sister   . Prostate cancer Neg Hx   . Colon cancer Neg Hx    Social History   Socioeconomic History  . Marital status: Widowed    Spouse name: Not on file  . Number of children: 2  . Years of education: Not on file  . Highest education level: Not on file  Occupational History  . 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 Financial resource strain: Not on file  . Food insecurity:    Worry: Not on file    Inability: Not on file  . Transportation needs:    Medical: Not on file    Non-medical: Not on file  Tobacco Use  . Smoking status: Former Smoker    Packs/day:  1.00    Years: 8.00    Pack years: 8.00    Types: Cigarettes  . Smokeless tobacco: Never Used  . Tobacco comment: "quit smoking cigarettes in 1958"  Substance and Sexual Activity  . Alcohol use: No    Alcohol/week: 0.0 oz  . Drug use: No  . Sexual activity: Yes  Lifestyle  . Physical activity:    Days per week: Not on file    Minutes per session: Not on file  . Stress: Not on file  Relationships  . Social connections:    Talks on phone: Not on file    Gets together: Not on file    Attends religious service: Not on file    Active member of club or organization: Not on file    Attends meetings of clubs or organizations: Not on file    Relationship status: Not on file  Other Topics Concern  . Not on file  Social History Narrative   From [place].  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     Lives with girlfriend [name].  Widowed 12/2005.    Outpatient Encounter Medications as of 05/09/2018  Medication Sig  . aspirin 81 MG tablet Take 81 mg by mouth daily.  . BD INSULIN SYRINGE ULTRAFINE 31G X 5/16" 0.3 ML MISC USE DAILY AS INSTRUCTED  . Cholecalciferol (VITAMIN D) 1000 UNITS capsule Take 1,000 Units by mouth daily.   . colchicine 0.6 MG tablet Take 1 tablet (0.6 mg total) by mouth daily as needed (gout flare up).  . fish oil-omega-3 fatty acids 1000 MG capsule Take 1 g by mouth daily.   . furosemide (LASIX) 80 MG tablet TAKE 1 TABLET DAILY  . glucose blood (FREESTYLE LITE) test strip USE TO TEST BLOOD SUGAR ONCE DAILY AS ANS NEEDED FOR DM (DX. E11.40)  . insulin glargine (LANTUS) 100 UNIT/ML injection INJECT 20-22 UNITS AT BEDTIME  . isosorbide mononitrate (IMDUR) 60 MG 24 hr tablet Take 1 tablet (60 mg total) by mouth 2 (two) times daily with a meal.  . magnesium oxide (MAG-OX) 400 MG tablet Take 400 mg by mouth daily.   . metoprolol tartrate (LOPRESSOR) 50 MG tablet Take 1  tablet (50 mg total) by mouth 2 (two) times daily.  . Multiple Vitamin  (MULTIVITAMIN WITH MINERALS) TABS Take 1 tablet by mouth daily.  . nitroGLYCERIN (NITROSTAT) 0.4 MG SL tablet Place 1 tablet (0.4 mg total) under the tongue every 5 (five) minutes as needed for chest pain.  [name] Potassium Gluconate (K-99) 595 MG CAPS Take 595 mg by mouth daily.   . ranolazine (RANEXA) 500 MG 12 hr tablet Take 1 tablet (500 mg total) by mouth 2 (two) times daily.  . rosuvastatin (CRESTOR) 20 MG tablet TAKE 1 TABLET DAILY  . traMADol (ULTRAM) 50 MG tablet Take 1-2 tablets (50-100 mg total) by mouth every 12 (twelve) hours as needed.  . [DISCONTINUED] metoprolol tartrate (LOPRESSOR) 50 MG tablet TAKE 1 TABLET BY MOUTH TWICE A DAY   No facility-administered encounter medications on file as of 05/09/2018.     Activities of Daily Living In your present state of health, do you have any difficulty performing the following activities: 05/09/2018  Hearing? Y  Vision? Y  Difficulty concentrating or making decisions? N  Walking or climbing stairs? Y  Dressing or bathing? N  Doing errands, shopping? Y  Preparing Food and eating ? N  Using the Toilet? N  In the past six months, have you accidently leaked urine? N  Do you have problems with loss of bowel control? N  Managing your Medications? N  Managing your Finances? N  Housekeeping or managing your Housekeeping? N  Some recent data might be hidden    Patient Care Team: Tonia Ghent, MD as PCP - General Royann Shivers, MD as Referring Physician (Nephrology) [name], MD as Consulting Physician (Cardiology) Barbaraann Cao, OD as Referring Physician (Optometry) Allyn Kenner, MD as Consulting Physician (Dermatology)   Assessment:   This is a routine wellness examination for Per.  Exercise Activities and Dietary recommendations Current Exercise Habits: The patient does not participate in regular exercise at present, Exercise limited by: None identified  Goals    . Patient Stated     Starting 05/08/2018, I  will continue to take medications as prescribed.        Fall Risk Fall Risk  05/09/2018 06/09/2017 05/08/2017 03/07/2016 09/08/2014  Falls in the past year? No No No Yes Yes  Comment - Emmi Telephone Survey: data to providers prior to load - fell down steps while carrying case of water -  Number falls in past yr: - - - 1 1  Injury with Fall? - - - No -  Risk for fall due to : - - - - Other (Comment)  Follow up - - - Falls evaluation completed;Education provided -    Depression Screen PHQ 2/9 Scores 05/09/2018 05/08/2017 03/07/2016 09/08/2014  PHQ - 2 Score 0 0 0 0  PHQ- 9 Score 0 - - -    Cognitive Function MMSE - Mini Mental State Exam 05/09/2018 05/08/2017 03/07/2016  Orientation to time 5 5 5   Orientation to Place 5 5 5   Registration 3 3 3   Attention/ Calculation 0 0 0  Recall 3 3 3   Language- name 2 objects 0 0 0  Language- repeat 1 1 1   Language- follow 3 step command 3 3 3   Language- read & follow direction 0 0 0  Write a sentence 0 0 0  Copy design 0 0 0  Total score 20 20 20         Immunization History  Administered Date(s) Administered  . Influenza Split 09/05/2011,  09/24/2012, 07/17/2014  . Influenza Whole 09/14/2006, 09/12/2007  . Influenza,inj,Quad PF,6+ Mos 09/19/2013, 08/31/2015, 07/07/2016  . Pneumococcal Conjugate-13 04/28/2015  . Pneumococcal Polysaccharide-23 09/14/2002  . Td 12/10/1998, 03/16/2009  . Zoster 10/04/2007    Screening Tests Health Maintenance  Topic Date Due  . FOOT EXAM  05/23/2018  . INFLUENZA VACCINE  06/14/2018  . HEMOGLOBIN A1C  11/01/2018  . TETANUS/TDAP  03/17/2019  . OPHTHALMOLOGY EXAM  04/14/2019  . URINE MICROALBUMIN  05/10/2019  . PNA vac Low Risk Adult  Completed       Plan:     I have personally reviewed, addressed, and noted the following in the patient's chart:  A. Medical and social history B. Use of alcohol, tobacco or illicit drugs  C. Current medications and supplements D. Functional ability and status E.    Nutritional status F.  Physical activity G. Advance directives H. List of other physicians I.  Hospitalizations, surgeries, and ER visits in previous 12 months J.  [place] to include hearing, vision, cognitive, depression L. Referrals and appointments - none  In addition, I have reviewed and discussed with patient certain preventive protocols, quality metrics, and best practice recommendations. A written personalized care plan for preventive services as well as general preventive health recommendations were provided to patient.  See attached scanned questionnaire for additional information.   Signed,   Lindell Noe, MHA, BS, LPN Health Coach

## 2018-05-09 NOTE — Patient Instructions (Addendum)
[name] , Thank you for taking time to come for your Medicare Wellness Visit. I appreciate your ongoing commitment to your health goals. Please review the following plan we discussed and let me know if I can assist you in the future.   These are the goals we discussed: Goals    . Patient Stated     Starting 05/08/2018, I will continue to take medications as prescribed.        This is a list of the screening recommended for you and due dates:  Health Maintenance  Topic Date Due  . Complete foot exam   05/23/2018  . Flu Shot  06/14/2018  . Hemoglobin A1C  11/01/2018  . Tetanus Vaccine  03/17/2019  . Eye exam for diabetics  04/14/2019  . Urine Protein Check  05/10/2019  . Pneumonia vaccines  Completed   Preventive Care for Adults  A healthy lifestyle and preventive care can promote health and wellness. Preventive health guidelines for adults include the following key practices.  . A routine yearly physical is a good way to check with your health care provider about your health and preventive screening. It is a chance to share any concerns and updates on your health and to receive a thorough exam.  . Visit your dentist for a routine exam and preventive care every 6 months. Brush your teeth twice a day and floss once a day. Good oral hygiene prevents tooth decay and gum disease.  . The frequency of eye exams is based on your age, health, family medical history, use  of contact lenses, and other factors. Follow your health care provider's recommendations for frequency of eye exams.  . Eat a healthy diet. Foods like vegetables, fruits, whole grains, low-fat dairy products, and lean protein foods contain the nutrients you need without too many calories. Decrease your intake of foods high in solid fats, added sugars, and salt. Eat the right amount of calories for you. Get information about a proper diet from your health care provider, if necessary.  . Regular physical exercise is one of  the most important things you can do for your health. Most adults should get at least 150 minutes of moderate-intensity exercise (any activity that increases your heart rate and causes you to sweat) each week. In addition, most adults need muscle-strengthening exercises on 2 or more days a week.  Silver Sneakers may be a benefit available to you. To determine eligibility, you may visit the website: www.silversneakers.com or contact program at 318-644-9266 Mon-Fri between 8AM-8PM.   . Maintain a healthy weight. The body mass index (BMI) is a screening tool to identify possible weight problems. It provides an estimate of body fat based on height and weight. Your health care provider can find your BMI and can help you achieve or maintain a healthy weight.   For adults 20 years and older: ? A BMI below 18.5 is considered underweight. ? A BMI of 18.5 to 24.9 is normal. ? A BMI of 25 to 29.9 is considered overweight. ? A BMI of 30 and above is considered obese.   . Maintain normal blood lipids and cholesterol levels by exercising and minimizing your intake of saturated fat. Eat a balanced diet with plenty of fruit and vegetables. Blood tests for lipids and cholesterol should begin at age 72 and be repeated every 5 years. If your lipid or cholesterol levels are high, you are over 50, or you are at high risk for heart disease, you may need your  cholesterol levels checked more frequently. Ongoing high lipid and cholesterol levels should be treated with medicines if diet and exercise are not working.  . If you smoke, find out from your health care provider how to quit. If you do not use tobacco, please do not start.  . If you choose to drink alcohol, please do not consume more than 2 drinks per day. One drink is considered to be 12 ounces (355 mL) of beer, 5 ounces (148 mL) of wine, or 1.5 ounces (44 mL) of liquor.  . If you are 57-62 years old, ask your health care provider if you should take aspirin to  prevent strokes.  . Use sunscreen. Apply sunscreen liberally and repeatedly throughout the day. You should seek shade when your shadow is shorter than you. Protect yourself by wearing long sleeves, pants, a wide-brimmed hat, and sunglasses year round, whenever you are outdoors.  . Once a month, do a whole body skin exam, using a mirror to look at the skin on your back. Tell your health care provider of new moles, moles that have irregular borders, moles that are larger than a pencil eraser, or moles that have changed in shape or color.

## 2018-05-09 NOTE — Progress Notes (Signed)
PCP notes:   Health maintenance:  Microalbumin - completed Foot exam - PCP please address at next appt  Abnormal screenings:   None  Patient concerns:   None  Nurse concerns:  None  Next PCP appt:   06/22/18 @ 1045

## 2018-05-23 ENCOUNTER — Ambulatory Visit (INDEPENDENT_AMBULATORY_CARE_PROVIDER_SITE_OTHER): Payer: Medicare Other | Admitting: Cardiovascular Disease

## 2018-05-23 ENCOUNTER — Encounter: Payer: Self-pay | Admitting: Cardiovascular Disease

## 2018-05-23 VITALS — BP 112/64 | HR 63 | Ht 67.0 in | Wt 198.0 lb

## 2018-05-23 DIAGNOSIS — E7800 Pure hypercholesterolemia, unspecified: Secondary | ICD-10-CM

## 2018-05-23 DIAGNOSIS — I483 Typical atrial flutter: Secondary | ICD-10-CM

## 2018-05-23 DIAGNOSIS — Z951 Presence of aortocoronary bypass graft: Secondary | ICD-10-CM | POA: Diagnosis not present

## 2018-05-23 DIAGNOSIS — I6523 Occlusion and stenosis of bilateral carotid arteries: Secondary | ICD-10-CM

## 2018-05-23 DIAGNOSIS — I5033 Acute on chronic diastolic (congestive) heart failure: Secondary | ICD-10-CM

## 2018-05-23 DIAGNOSIS — I110 Hypertensive heart disease with heart failure: Secondary | ICD-10-CM | POA: Diagnosis not present

## 2018-05-23 NOTE — Assessment & Plan Note (Signed)
History of chronic A. fib rate controlled not on oral anticoagulation because of prior GI bleeds.

## 2018-05-23 NOTE — Assessment & Plan Note (Signed)
She of chronic diastolic heart failure on furosemide with minimal peripheral edema.

## 2018-05-23 NOTE — Assessment & Plan Note (Signed)
History of CAD status post bypass grafting in 1996 with redo in 2006 and subsequent stenting of obtuse marginal branch by [name] on 2 occasions.  He no longer is on anti-platelet therapy because of history of GI bleeding.

## 2018-05-23 NOTE — Assessment & Plan Note (Signed)
History of carotid artery disease status post right carotid endarterectomy performed by [name] in 1994 performed 2/66/19 revealing no evidence of ICA stenosis.  This will be repeated on an annual basis.

## 2018-05-23 NOTE — Patient Instructions (Signed)
Medication Instructions: Your physician recommends that you continue on your current medications as directed. Please refer to the Current Medication list given to you today.  If you need a refill on your cardiac medications before your next appointment, please call your pharmacy.   Follow-Up: Your physician wants you to follow-up in 12 months with Dr. Gwenlyn Found. You will receive a reminder letter in the mail two months in advance. If you don't receive a letter, please call our office at [phone] to schedule this follow-up appointment.   Thank you for choosing [facility] at [facility]!!

## 2018-05-23 NOTE — Progress Notes (Signed)
05/23/2018 Bradly Bienenstock Wamser   07-01-1932  [id number]  Primary Physician Tonia Ghent, MD Primary Cardiologist: [name] MD [name], [place], [place]  HPI:  [name] is a 84 y.o.   engaged Caucasian male formerly a patient of [name]. I last saw him in the office 05/23/2017. Mr. Beg is the father of one daughter and one son , the grandfather to 2 grandchildren.  He is accompanied by his daughter [name] today.  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 His past medical history is remarkable for coronary artery bypass grafting in 1996 and again in 2006 by [name]. He had remote right carotid endarterectomy by [name] in 1994. History of hypertension, hyperlipidemia and diabetes. He was hospitalized in late July 2015 because of unstable angina. [name] performed cardiac catheterization revealing a high-grade obtuse marginal branch vein graft stenosis which was stented. Because of recurrent chest pain he was re-intervention relates later and found to have a new hazy lesion just proximal to the previously placed stent which was again intervened on. He was deemed a Plavix nonresponder and was begun on Brilenta. He also has a remote history of PAF as well as diverticulitis and has had GI bleed on Coumadin. His oral anticoagulation was discontinued.  He was recently admitted on 11/13/15 for a GI bleed. He underwent endoscopy revealing gastritis, colonoscopy revealing bleeding diverticula which was treated with clipping. He had 3 units of packed red blood cell transfusion. He did have chest pain and positive enzymes after his endoscopy within the echo that showed an EF of 50% with a new anterior wall motion and reality. He was thought to have "a non-STEMI. His Plavix was discontinued. Because of his moderate renal insufficiency and his inability to take antiplatelet therapy he did not undergo repeat cardiac catheterization. He  underwent left-sided hemicolectomy and sigmoidectomy because of chronic GI bleeding. This was uneventful. His hemoglobin has remained stable He denies chest pain but does get easily fatigued. Since I saw him a year ago he's remained clinically stable and is relatively asymptomatic.     Current Meds  Medication Sig  . aspirin 81 MG tablet Take 81 mg by mouth daily.  . BD INSULIN SYRINGE ULTRAFINE 31G X 5/16" 0.3 ML MISC USE DAILY AS INSTRUCTED  . Cholecalciferol (VITAMIN D) 1000 UNITS capsule Take 1,000 Units by mouth daily.   . colchicine 0.6 MG tablet Take 1 tablet (0.6 mg total) by mouth daily as needed (gout flare up).  . fish oil-omega-3 fatty acids 1000 MG capsule Take 1 g by mouth daily.   . furosemide (LASIX) 80 MG tablet TAKE 1 TABLET DAILY  . glucose blood (FREESTYLE LITE) test strip USE TO TEST BLOOD SUGAR ONCE DAILY AS ANS NEEDED FOR DM (DX. E11.40)  . insulin glargine (LANTUS) 100 UNIT/ML injection INJECT 20-22 UNITS AT BEDTIME  . isosorbide mononitrate (IMDUR) 60 MG 24 hr tablet Take 1 tablet (60 mg total) by mouth 2 (two) times daily with a meal.  . magnesium oxide (MAG-OX) 400 MG tablet Take 400 mg by mouth daily.   . metoprolol tartrate (LOPRESSOR) 50 MG tablet Take 1 tablet (50 mg total) by mouth 2 (two) times daily.  . Multiple Vitamin (MULTIVITAMIN WITH MINERALS) TABS Take 1 tablet by mouth daily.  . nitroGLYCERIN (NITROSTAT) 0.4 MG SL tablet Place 1 tablet (0.4 mg total) under the tongue every 5 (five) minutes as needed for chest pain.  [name]  Potassium Gluconate (K-99) 595 MG CAPS Take 595 mg by mouth daily.   . ranolazine (RANEXA) 500 MG 12 hr tablet Take 1 tablet (500 mg total) by mouth 2 (two) times daily.  . rosuvastatin (CRESTOR) 20 MG tablet TAKE 1 TABLET DAILY  . traMADol (ULTRAM) 50 MG tablet Take 1-2 tablets (50-100 mg total) by mouth every 12 (twelve) hours as needed.     No Known Allergies  Social History   Socioeconomic History  . Marital status: Widowed     Spouse name: Not on file  . Number of children: 2  . Years of education: Not on file  . Highest education level: Not on file  Occupational History  . 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 Financial resource strain: Not on file  . Food insecurity:    Worry: Not on file    Inability: Not on file  . Transportation needs:    Medical: Not on file    Non-medical: Not on file  Tobacco Use  . Smoking status: Former Smoker    Packs/day: 1.00    Years: 8.00    Pack years: 8.00    Types: Cigarettes  . Smokeless tobacco: Never Used  . Tobacco comment: "quit smoking cigarettes in 1958"  Substance and Sexual Activity  . Alcohol use: No    Alcohol/week: 0.0 oz  . Drug use: No  . Sexual activity: Yes  Lifestyle  . Physical activity:    Days per week: Not on file    Minutes per session: Not on file  . Stress: Not on file  Relationships  . Social connections:    Talks on phone: Not on file    Gets together: Not on file    Attends religious service: Not on file    Active member of club or organization: Not on file    Attends meetings of clubs or organizations: Not on file    Relationship status: Not on file  . Intimate partner violence:    Fear of current or ex partner: Not on file    Emotionally abused: Not on file    Physically abused: Not on file    Forced sexual activity: Not on file  Other Topics Concern  . Not on file  Social History Narrative   From [place].  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     Lives with girlfriend [name].  Widowed 12/2005.     Review of Systems: General: negative for chills, fever, night sweats or weight changes.  Cardiovascular: negative for chest pain, dyspnea on exertion, edema, orthopnea, palpitations, paroxysmal nocturnal dyspnea or shortness of breath Dermatological: negative for rash Respiratory: negative for cough or wheezing Urologic: negative for  hematuria Abdominal: negative for nausea, vomiting, diarrhea, bright red blood per rectum, melena, or hematemesis Neurologic: negative for visual changes, syncope, or dizziness All other systems reviewed and are otherwise negative except as noted above.    Blood pressure 112/64, pulse 63, height 5\' 7"  (1.702 m), weight 198 lb (89.8 kg).  General appearance: alert and no distress Neck: no adenopathy, no carotid bruit, no JVD, supple, symmetrical, trachea midline and thyroid not enlarged, symmetric, no tenderness/mass/nodules Lungs: clear to auscultation bilaterally Heart: irregularly irregular rhythm Extremities: extremities normal, atraumatic, no cyanosis or edema Pulses: 2+ and symmetric Skin: Skin color, texture, turgor normal. No rashes or lesions Neurologic: Alert and oriented X 3, normal strength and tone. Normal symmetric  reflexes. Normal coordination and gait  EKG atrial fibrillation with ventricular response of 63, right bundle branch block with left anterior fascicular block (bifascicular block).  I personally reviewed this EKG.  ASSESSMENT AND PLAN:   HLD (hyperlipidemia) History of hyperlipidemia on statin therapy with lipid profile performed 05/09/2018 revealing total cholesterol 105, LDL 57 and HDL of 29.  Hypertensive heart disease History of essential hypertension her blood pressure measured at 112/64.  He is on metoprolol.  Continue current meds at current dosing.  Atrial flutter ([name]) History of chronic A. fib rate controlled not on oral anticoagulation because of prior GI bleeds.  Carotid artery disease History of carotid artery disease status post right carotid endarterectomy performed by [name] in 1994 performed 2/66/19 revealing no evidence of ICA stenosis.  This will be repeated on an annual basis.  Hx of CABG History of CAD status post bypass grafting in 1996 with redo in 2006 and subsequent stenting of obtuse marginal branch by [name] on 2 occasions.  He  no longer is on anti-platelet therapy because of history of GI bleeding.  Acute on chronic diastolic (congestive) heart failure ([name]) She of chronic diastolic heart failure on furosemide with minimal peripheral edema.      [name] MD FACP,FACC,FAHA, [facility] 05/23/2018 10:31 AM

## 2018-05-23 NOTE — Assessment & Plan Note (Signed)
History of essential hypertension her blood pressure measured at 112/64.  He is on metoprolol.  Continue current meds at current dosing.

## 2018-05-23 NOTE — Assessment & Plan Note (Signed)
History of hyperlipidemia on statin therapy with lipid profile performed 05/09/2018 revealing total cholesterol 105, LDL 57 and HDL of 29.

## 2018-05-24 ENCOUNTER — Encounter: Payer: Medicare Other | Admitting: Family Medicine

## 2018-05-24 ENCOUNTER — Other Ambulatory Visit: Payer: Self-pay | Admitting: Cardiovascular Disease

## 2018-05-31 ENCOUNTER — Other Ambulatory Visit: Payer: Self-pay | Admitting: Cardiovascular Disease

## 2018-05-31 NOTE — Telephone Encounter (Signed)
Rx request sent to pharmacy.  

## 2018-06-22 ENCOUNTER — Encounter: Payer: Self-pay | Admitting: Family Medicine

## 2018-06-22 ENCOUNTER — Ambulatory Visit (INDEPENDENT_AMBULATORY_CARE_PROVIDER_SITE_OTHER): Payer: Medicare Other | Admitting: Family Medicine

## 2018-06-22 VITALS — BP 130/60 | HR 72 | Temp 98.4°F | Ht 66.0 in | Wt 203.2 lb

## 2018-06-22 DIAGNOSIS — Z7189 Other specified counseling: Secondary | ICD-10-CM | POA: Diagnosis not present

## 2018-06-22 DIAGNOSIS — I5042 Chronic combined systolic (congestive) and diastolic (congestive) heart failure: Secondary | ICD-10-CM

## 2018-06-22 DIAGNOSIS — E1142 Type 2 diabetes mellitus with diabetic polyneuropathy: Secondary | ICD-10-CM

## 2018-06-22 DIAGNOSIS — I6523 Occlusion and stenosis of bilateral carotid arteries: Secondary | ICD-10-CM | POA: Diagnosis not present

## 2018-06-22 DIAGNOSIS — N183 Chronic kidney disease, stage 3 unspecified: Secondary | ICD-10-CM

## 2018-06-22 DIAGNOSIS — H3530 Unspecified macular degeneration: Secondary | ICD-10-CM

## 2018-06-22 DIAGNOSIS — M109 Gout, unspecified: Secondary | ICD-10-CM | POA: Diagnosis not present

## 2018-06-22 DIAGNOSIS — E7800 Pure hypercholesterolemia, unspecified: Secondary | ICD-10-CM

## 2018-06-22 DIAGNOSIS — G4730 Sleep apnea, unspecified: Secondary | ICD-10-CM | POA: Diagnosis not present

## 2018-06-22 MED ORDER — INSULIN GLARGINE 100 UNIT/ML ~~LOC~~ SOLN: SUBCUTANEOUS | 3 refills | Status: DC

## 2018-06-22 NOTE — Patient Instructions (Signed)
Let me update cardiology in the meantime.  Don't change your meds for now.  If needed, you can increase your insulin by 1 unit per day but you may not have to do that if your sugar improves some in the near future.  Plan on recheck in 6 month with labs at the visit.  You don't have to fast for the labs at that visit.  We can get together sooner if needed, if your sugars are not well controlled.  I'll await the notes from cardiology.  Limit exertion in the meantime.  Take care.  Glad to see you.

## 2018-06-22 NOTE — Progress Notes (Signed)
Advance directive d/w pt. Daughter designated if patient were incapacitated.   Exertional SOB.  Noted going to the mailbox.  No sx at rest but now he notes it with walking- predictable, recurrent, resolves with rest.  More noted since last OV with cards.  No CP at rest- it's not burning like he had prior to prev stenting but some very mild pressure with exertion.  Clearly is better with rest.  He can feel more episodes of AF but not at the time of the OV.  No bleeding since off anticoagulation.    Gout.  He'll have occ B 1st toe pain.  Taking colcrys on the weekends usually, with some relief.  Uric acid 8.8.    Still using CPAP.  Compliant.   Macular degeneration.  Gradually worse.  Still on injection tx per eye clinic.  I'll defer.  He has f/u pending for next month.    Elevated Cholesterol: Using medications without problems: yes Muscle aches: he has some aches, but tolerable.  D/w pt.   Diet compliance: encouraged.   Exercise: encouraged as tolerated.    CKD.  Labs d/w pt.  Followed by renal at baseline.  MALB noted but I hesitate to add ACE given his Cr, d/w pt.    Diabetes:  Using medications without difficulties:yes Hypoglycemic episodes:no Hyperglycemic episodes:no Feet problems: no Blood Sugars averaging: 140-180s recent but that is with some diet changes, d/w pt.   eye exam within last year: A1c reasonable at 7.5.    PMH and SH reviewed  Meds, vitals, and allergies reviewed.   ROS: Per HPI unless specifically indicated in ROS section   GEN: nad, alert and oriented HEENT: mucous membranes moist NECK: supple w/o LA CV: rrr. PULM: ctab, no inc wob ABD: soft, +bs EXT: no edema SKIN: no acute rash Incisional hernia.    Diabetic foot exam: Normal inspection No skin breakdown No calluses  Normal DP pulses Normal sensation to light touch and monofilament Nails normal

## 2018-06-24 DIAGNOSIS — H3530 Unspecified macular degeneration: Secondary | ICD-10-CM | POA: Insufficient documentation

## 2018-06-24 NOTE — Assessment & Plan Note (Signed)
Continue statin.  Labs discussed with patient.  Continue work on diet.

## 2018-06-24 NOTE — Assessment & Plan Note (Signed)
Per ophthalmology.  I will defer.

## 2018-06-24 NOTE — Assessment & Plan Note (Signed)
Continue CPAP use.  Compliant.  With relief per patient report.

## 2018-06-24 NOTE — Assessment & Plan Note (Signed)
Exertional SOB.  Noted going to the mailbox.  No sx at rest but now he notes it with walking- predictable, recurrent, resolves with rest.  More noted since last OV with cards.  No CP at rest- it's not burning like he had prior to prev stenting but some very mild pressure with exertion.  Clearly is better with rest.  He can feel more episodes of AF but not at the time of the OV.  No bleeding since off anticoagulation.  Advised to limit exertion and I will update cardiology in the meantime.  The symptoms have developed and come more noticeable since last office visit with cardiology.

## 2018-06-24 NOTE — Assessment & Plan Note (Signed)
Advance directive d/w pt.  Daughter designated if patient were incapacitated.   

## 2018-06-24 NOTE — Assessment & Plan Note (Signed)
He can adjust his insulin as needed.  Discussed with patient about diet.  Labs discussed with patient.  See after visit summary.  Recheck periodically.

## 2018-06-24 NOTE — Assessment & Plan Note (Signed)
Per renal.  I did not add ACE inhibitor at this point given his creatinine.  I will defer to renal clinic.  Patient understood.

## 2018-06-24 NOTE — Assessment & Plan Note (Signed)
Using colchicine as needed.  Continue as is.  Discussed with patient.  He agrees.

## 2018-07-06 ENCOUNTER — Encounter: Payer: Self-pay | Admitting: Cardiovascular Disease

## 2018-07-06 ENCOUNTER — Ambulatory Visit (INDEPENDENT_AMBULATORY_CARE_PROVIDER_SITE_OTHER): Payer: Medicare Other | Admitting: Cardiovascular Disease

## 2018-07-06 VITALS — BP 118/66 | HR 69 | Ht 67.0 in | Wt 201.0 lb

## 2018-07-06 DIAGNOSIS — R0602 Shortness of breath: Secondary | ICD-10-CM | POA: Diagnosis not present

## 2018-07-06 DIAGNOSIS — I6523 Occlusion and stenosis of bilateral carotid arteries: Secondary | ICD-10-CM

## 2018-07-06 DIAGNOSIS — R072 Precordial pain: Secondary | ICD-10-CM

## 2018-07-06 NOTE — Progress Notes (Signed)
07/06/2018 [name]   04-Feb-1932  [id number]  Primary Physician Jeremiah Ghent, MD Primary Cardiologist: Jeremiah Harp MD [name], [name], [name]  HPI:  [name] is a 84 y.o.  engaged Caucasian male formerly a patient of Dr. Estill [name]. I last saw him in the office 05/23/2018. [name] is the father of one daughter and one son , the grandfather to 2 grandchildren.  He is accompanied by his fiancs [name] today.  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 His past medical history is remarkable for coronary artery bypass grafting in 1996 and again in 2006 by Dr. Pia [name]. He had remote right carotid endarterectomy by Dr. Donnetta [name] in 1994. History of hypertension, hyperlipidemia and diabetes. He was hospitalized in late July 2015 because of unstable angina. Dr. Tamala [name] performed cardiac catheterization revealing a high-grade obtuse marginal branch vein graft stenosis which was stented. Because of recurrent chest pain he was re-intervention relates later and found to have a new hazy lesion just proximal to the previously placed stent which was again intervened on. He was deemed a Plavix nonresponder and was begun on Brilenta. He also has a remote history of PAF as well as diverticulitis and has had GI bleed on Coumadin. His oral anticoagulation was discontinued.  He was recently admitted on 11/13/15 for a GI bleed. He underwent endoscopy revealing gastritis, colonoscopy revealing bleeding diverticula which was treated with clipping. He had 3 units of packed red blood cell transfusion. He did have chest pain and positive enzymes after his endoscopy within the echo that showed an EF of 50% with a new anterior wall motion and reality. He was thought to have "a non-STEMI. His Plavix was discontinued. Because of his moderate renal insufficiency and his inability to take antiplatelet therapy he did not undergo repeat cardiac catheterization. He  underwent left-sided hemicolectomy and sigmoidectomy because of chronic GI bleeding. This was uneventful. His hemoglobin has remained stable in the 12 range last checked in April. Since I saw him back 6 weeks ago he developed new onset progressive dyspnea and chest pain.    Current Meds  Medication Sig  . aspirin 81 MG tablet Take 81 mg by mouth daily.  . BD INSULIN SYRINGE ULTRAFINE 31G X 5/16" 0.3 ML MISC USE DAILY AS INSTRUCTED  . Cholecalciferol (VITAMIN D) 1000 UNITS capsule Take 1,000 Units by mouth daily.   . colchicine 0.6 MG tablet Take 1 tablet (0.6 mg total) by mouth daily as needed (gout flare up).  . fish oil-omega-3 fatty acids 1000 MG capsule Take 1 g by mouth daily.   . furosemide (LASIX) 80 MG tablet TAKE 1 TABLET DAILY  . glucose blood (FREESTYLE LITE) test strip USE TO TEST BLOOD SUGAR ONCE DAILY AS ANS NEEDED FOR DM (DX. E11.40)  . insulin glargine (LANTUS) 100 UNIT/ML injection INJECT 25 UNITS AT BEDTIME  . isosorbide mononitrate (IMDUR) 60 MG 24 hr tablet Take 1 tablet (60 mg total) by mouth 2 (two) times daily with a meal.  . magnesium oxide (MAG-OX) 400 MG tablet Take 400 mg by mouth daily.   . Multiple Vitamin (MULTIVITAMIN WITH MINERALS) TABS Take 1 tablet by mouth daily.  . nitroGLYCERIN (NITROSTAT) 0.4 MG SL tablet Place 1 tablet (0.4 mg total) under the tongue every 5 (five) minutes as needed for chest pain.  [name] Potassium Gluconate (K-99) 595 MG CAPS Take 595 mg by mouth daily.   [name] RANEXA 500  MG 12 hr tablet TAKE 1 TABLET TWICE A DAY  . rosuvastatin (CRESTOR) 20 MG tablet TAKE 1 TABLET DAILY  . traMADol (ULTRAM) 50 MG tablet Take 1-2 tablets (50-100 mg total) by mouth every 12 (twelve) hours as needed.     No Known Allergies  Social History   Socioeconomic History  . Marital status: Widowed    Spouse name: Not on file  . Number of children: 2  . Years of education: Not on file  . Highest education level: Not on file  Occupational History  . 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 Financial resource strain: Not on file  . Food insecurity:    Worry: Not on file    Inability: Not on file  . Transportation needs:    Medical: Not on file    Non-medical: Not on file  Tobacco Use  . Smoking status: Former Smoker    Packs/day: 1.00    Years: 8.00    Pack years: 8.00    Types: Cigarettes  . Smokeless tobacco: Never Used  . Tobacco comment: "quit smoking cigarettes in 1958"  Substance and Sexual Activity  . Alcohol use: No    Alcohol/week: 0.0 standard drinks  . Drug use: No  . Sexual activity: Yes  Lifestyle  . Physical activity:    Days per week: Not on file    Minutes per session: Not on file  . Stress: Not on file  Relationships  . Social connections:    Talks on phone: Not on file    Gets together: Not on file    Attends religious service: Not on file    Active member of club or organization: Not on file    Attends meetings of clubs or organizations: Not on file    Relationship status: Not on file  . Intimate partner violence:    Fear of current or ex partner: Not on file    Emotionally abused: Not on file    Physically abused: Not on file    Forced sexual activity: Not on file  Other Topics Concern  . Not on file  Social History Narrative   From [place].  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     Lives with girlfriend [name].  Widowed 12/2005.     Review of Systems: General: negative for chills, fever, night sweats or weight changes.  Cardiovascular: negative for chest pain, dyspnea on exertion, edema, orthopnea, palpitations, paroxysmal nocturnal dyspnea or shortness of breath Dermatological: negative for rash Respiratory: negative for cough or wheezing Urologic: negative for hematuria Abdominal: negative for nausea, vomiting, diarrhea, bright red blood per rectum, melena, or hematemesis Neurologic: negative for visual changes, syncope,  or dizziness All other systems reviewed and are otherwise negative except as noted above.    Blood pressure 118/66, pulse 69, height 5\' 7"  (1.702 m), weight 201 lb (91.2 kg).  General appearance: alert and no distress Neck: no adenopathy, no carotid bruit, no JVD, supple, symmetrical, trachea midline and thyroid not enlarged, symmetric, no tenderness/mass/nodules Lungs: clear to auscultation bilaterally Heart: regular rate and rhythm, S1, S2 normal, no murmur, click, rub or gallop Extremities: extremities normal, atraumatic, no cyanosis or edema Pulses: 2+ and symmetric Skin: Skin color, texture, turgor normal. No rashes or lesions Neurologic: Alert and oriented X 3, normal strength and tone. Normal symmetric reflexes. Normal coordination and gait  EKG sinus rhythm at 69 with right  bundle branch block and left anterior fascicular block (bifascicular block was present.  I personally reviewed this EKG.  ASSESSMENT AND PLAN:   Hx of CABG Mr. Pelzer returns today for follow-up.  I just saw him 6 weeks ago.  Since that time he is noticed increasing dyspnea and exertional chest tightness.  He has an extensive coronary history with remote CABG, redo CABG and intervention subsequent to that.  I am going to get a 2D echo and a pharmacologic Myoview stress test to further evaluate.      Jeremiah Harp MD FACP,FACC,FAHA, [facility] 07/06/2018 12:19 PM

## 2018-07-06 NOTE — Patient Instructions (Signed)
Medication Instructions:   NO CHANGE  Testing/Procedures:  Your physician has requested that you have an echocardiogram. Echocardiography is a painless test that uses sound waves to create images of your heart. It provides your doctor with information about the size and shape of your heart and how well your heart's chambers and valves are working. This procedure takes approximately one hour. There are no restrictions for this procedure.   Your physician has requested that you have a lexiscan myoview. For further information please visit [email or web address]. Please follow instruction sheet, as given.    Follow-Up:  Your physician recommends that you schedule a follow-up appointment WITH DR Gwenlyn Found AFTER TESTING COMPLETE

## 2018-07-06 NOTE — Assessment & Plan Note (Signed)
Mr. Shifflet returns today for follow-up.  I just saw him 6 weeks ago.  Since that time he is noticed increasing dyspnea and exertional chest tightness.  He has an extensive coronary history with remote CABG, redo CABG and intervention subsequent to that.  I am going to get a 2D echo and a pharmacologic Myoview stress test to further evaluate.

## 2018-07-10 ENCOUNTER — Telehealth (HOSPITAL_COMMUNITY): Payer: Self-pay

## 2018-07-10 ENCOUNTER — Ambulatory Visit (HOSPITAL_COMMUNITY): Payer: Medicare Other | Attending: Cardiovascular Disease

## 2018-07-10 ENCOUNTER — Other Ambulatory Visit: Payer: Self-pay

## 2018-07-10 DIAGNOSIS — E1122 Type 2 diabetes mellitus with diabetic chronic kidney disease: Secondary | ICD-10-CM | POA: Diagnosis not present

## 2018-07-10 DIAGNOSIS — I2510 Atherosclerotic heart disease of native coronary artery without angina pectoris: Secondary | ICD-10-CM | POA: Diagnosis not present

## 2018-07-10 DIAGNOSIS — I4892 Unspecified atrial flutter: Secondary | ICD-10-CM | POA: Diagnosis not present

## 2018-07-10 DIAGNOSIS — E785 Hyperlipidemia, unspecified: Secondary | ICD-10-CM | POA: Insufficient documentation

## 2018-07-10 DIAGNOSIS — I4510 Unspecified right bundle-branch block: Secondary | ICD-10-CM | POA: Insufficient documentation

## 2018-07-10 DIAGNOSIS — I509 Heart failure, unspecified: Secondary | ICD-10-CM | POA: Diagnosis not present

## 2018-07-10 DIAGNOSIS — G4730 Sleep apnea, unspecified: Secondary | ICD-10-CM | POA: Diagnosis not present

## 2018-07-10 DIAGNOSIS — Z951 Presence of aortocoronary bypass graft: Secondary | ICD-10-CM | POA: Diagnosis not present

## 2018-07-10 DIAGNOSIS — I4891 Unspecified atrial fibrillation: Secondary | ICD-10-CM | POA: Insufficient documentation

## 2018-07-10 DIAGNOSIS — R0602 Shortness of breath: Secondary | ICD-10-CM | POA: Diagnosis not present

## 2018-07-10 DIAGNOSIS — N183 Chronic kidney disease, stage 3 (moderate): Secondary | ICD-10-CM | POA: Insufficient documentation

## 2018-07-10 DIAGNOSIS — Z87891 Personal history of nicotine dependence: Secondary | ICD-10-CM | POA: Diagnosis not present

## 2018-07-10 DIAGNOSIS — I130 Hypertensive heart and chronic kidney disease with heart failure and stage 1 through stage 4 chronic kidney disease, or unspecified chronic kidney disease: Secondary | ICD-10-CM | POA: Diagnosis not present

## 2018-07-10 DIAGNOSIS — I252 Old myocardial infarction: Secondary | ICD-10-CM | POA: Insufficient documentation

## 2018-07-10 MED ORDER — PERFLUTREN LIPID MICROSPHERE
1.0000 mL | INTRAVENOUS | Status: AC | PRN
  Administered 2018-07-10: 2 mL via INTRAVENOUS

## 2018-07-10 NOTE — Telephone Encounter (Signed)
Encounter complete. 

## 2018-07-12 ENCOUNTER — Ambulatory Visit (HOSPITAL_COMMUNITY)
Admission: RE | Admit: 2018-07-12 | Discharge: 2018-07-12 | Disposition: A | Discharge status: Home / Self Care | Payer: Medicare Other | Source: Ambulatory Visit | Attending: Cardiology | Admitting: Cardiology

## 2018-07-12 DIAGNOSIS — R072 Precordial pain: Secondary | ICD-10-CM | POA: Diagnosis not present

## 2018-07-12 LAB — MYOCARDIAL PERFUSION IMAGING
CHL CUP NUCLEAR SDS: 3
CHL CUP NUCLEAR SSS: 18
LV dias vol: 164 mL (ref 62–150)
LV sys vol: 101 mL
NUC STRESS TID: 1.16
Peak HR: 65 {beats}/min
Rest HR: 62 {beats}/min
SRS: 15

## 2018-07-12 MED ORDER — TECHNETIUM TC 99M TETROFOSMIN IV KIT
27.0000 | PACK | Freq: Once | INTRAVENOUS | Status: AC | PRN
  Administered 2018-07-12: 27 via INTRAVENOUS
  Filled 2018-07-12: qty 27

## 2018-07-12 MED ORDER — TECHNETIUM TC 99M TETROFOSMIN IV KIT
8.1000 | PACK | Freq: Once | INTRAVENOUS | Status: AC | PRN
  Administered 2018-07-12: 8.1 via INTRAVENOUS
  Filled 2018-07-12: qty 9

## 2018-07-12 MED ORDER — REGADENOSON 0.4 MG/5ML IV SOLN
0.4000 mg | Freq: Once | INTRAVENOUS | Status: AC
  Administered 2018-07-12: 0.4 mg via INTRAVENOUS

## 2018-07-17 ENCOUNTER — Other Ambulatory Visit: Payer: Self-pay | Admitting: Family Medicine

## 2018-07-17 NOTE — Telephone Encounter (Signed)
Electronic refill request Last office visit 06/22/18 Refill request does not match medication list Refilled at office visit 06/22/18 with new directions -40 ml/3 refills shows no print Please confirm change in directions

## 2018-07-18 MED ORDER — INSULIN GLARGINE 100 UNIT/ML ~~LOC~~ SOLN: SUBCUTANEOUS | 3 refills | Status: DC

## 2018-07-18 NOTE — Telephone Encounter (Signed)
Sig reviewed.  rx sent.  Thanks.

## 2018-07-25 ENCOUNTER — Encounter: Payer: Self-pay | Admitting: Cardiovascular Disease

## 2018-07-25 ENCOUNTER — Ambulatory Visit (INDEPENDENT_AMBULATORY_CARE_PROVIDER_SITE_OTHER): Payer: Medicare Other | Admitting: Cardiovascular Disease

## 2018-07-25 VITALS — BP 102/50 | HR 65 | Ht 67.0 in | Wt 199.8 lb

## 2018-07-25 DIAGNOSIS — Z01818 Encounter for other preprocedural examination: Secondary | ICD-10-CM | POA: Diagnosis not present

## 2018-07-25 DIAGNOSIS — Z951 Presence of aortocoronary bypass graft: Secondary | ICD-10-CM

## 2018-07-25 DIAGNOSIS — I6523 Occlusion and stenosis of bilateral carotid arteries: Secondary | ICD-10-CM | POA: Diagnosis not present

## 2018-07-25 NOTE — Progress Notes (Signed)
Mr. Rubi presents back today for follow-up of his outpatient test performed because of new onset exertional chest pain and dyspnea.  He has a dense inferolateral scar with mild peri-infarct ischemia new since this previous Myoview performed in 2015.  His LVEF is mildly reduced compared to previous echoes as well now in the 45 to 50% range with new segmental wall motion abnormalities.  Based on this I recommended that we proceed with outpatient diagnostic coronary angiography using minimal contrast.  It should be noted that his serum creatinine is moderately elevated at 2.14.  I have reviewed the risks, indications, and alternatives to cardiac catheterization, possible angioplasty, and stenting with the patient. Risks include but are not limited to bleeding, infection, vascular injury, stroke, myocardial infection, arrhythmia, kidney injury, radiation-related injury in the case of prolonged fluoroscopy use, emergency cardiac surgery, and death. The patient understands the risks of serious complication is 1-2 in 7048 with diagnostic cardiac cath and 1-2% or less with angioplasty/stenting.    [name], M.D., [place], [facility], [facility] [address] [place], [place]  [address]  [phone] 07/25/2018 11:57 AM

## 2018-07-25 NOTE — Patient Instructions (Signed)
Medication Instructions:  Your physician recommends that you continue on your current medications as directed. Please refer to the Current Medication list given to you today.   Labwork: Your physician recommends that you return for lab work in: TODAY   Testing/Procedures: none  Follow-Up: Your physician recommends that you schedule a follow-up appointment in: 4-6 weeks from 08/02/2018   Any Other Special Instructions Will Be Listed Below (If Applicable).     If you need a refill on your cardiac medications before your next appointment, please call your pharmacy.

## 2018-07-25 NOTE — H&P (View-Only) (Signed)
[name] presents back today for follow-up of his outpatient test performed because of new onset exertional chest pain and dyspnea.  He has a dense inferolateral scar with mild peri-infarct ischemia new since this previous Myoview performed in 2015.  His LVEF is mildly reduced compared to previous echoes as well now in the 45 to 50% range with new segmental wall motion abnormalities.  Based on this I recommended that we proceed with outpatient diagnostic coronary angiography using minimal contrast.  It should be noted that his serum creatinine is moderately elevated at 2.14.  I have reviewed the risks, indications, and alternatives to cardiac catheterization, possible angioplasty, and stenting with the patient. Risks include but are not limited to bleeding, infection, vascular injury, stroke, myocardial infection, arrhythmia, kidney injury, radiation-related injury in the case of prolonged fluoroscopy use, emergency cardiac surgery, and death. The patient understands the risks of serious complication is 1-2 in 2542 with diagnostic cardiac cath and 1-2% or less with angioplasty/stenting.    [name], M.D., [place], [facility], [place] [address] [place], [place]  [address]  [phone] 07/25/2018 11:57 AM

## 2018-07-25 NOTE — Assessment & Plan Note (Signed)
Mr. Corales presents back today for follow-up of his outpatient test performed because of new onset exertional chest pain and dyspnea.  He has a dense inferolateral scar with mild peri-infarct ischemia new since this previous Myoview performed in 2015.  His LVEF is mildly reduced compared to previous echoes as well now in the 45 to 50% range with new segmental wall motion abnormalities.  Based on this I recommended that we proceed with outpatient diagnostic coronary angiography using minimal contrast.  It should be noted that his serum creatinine is moderately elevated at 2.14.  I have reviewed the risks, indications, and alternatives to cardiac catheterization, possible angioplasty, and stenting with the patient. Risks include but are not limited to bleeding, infection, vascular injury, stroke, myocardial infection, arrhythmia, kidney injury, radiation-related injury in the case of prolonged fluoroscopy use, emergency cardiac surgery, and death. The patient understands the risks of serious complication is 1-2 in 5625 with diagnostic cardiac cath and 1-2% or less with angioplasty/stenting.

## 2018-07-25 NOTE — Addendum Note (Signed)
Addended by: Newt Minion on: 07/25/2018 12:36 PM   Modules accepted: Orders

## 2018-07-26 LAB — BASIC METABOLIC PANEL
BUN/Creatinine Ratio: 12 (ref 10–24)
BUN: 28 mg/dL — AB (ref 8–27)
CALCIUM: 10.1 mg/dL (ref 8.6–10.2)
CO2: 28 mmol/L (ref 20–29)
Chloride: 100 mmol/L (ref 96–106)
Creatinine, Ser: 2.3 mg/dL — ABNORMAL HIGH (ref 0.76–1.27)
GFR, EST AFRICAN AMERICAN: 29 mL/min/{1.73_m2} — AB (ref >59)
GFR, EST NON AFRICAN AMERICAN: 25 mL/min/{1.73_m2} — AB (ref >59)
Glucose: 224 mg/dL — ABNORMAL HIGH (ref 65–99)
POTASSIUM: 4.7 mmol/L (ref 3.5–5.2)
Sodium: 141 mmol/L (ref 134–144)

## 2018-07-26 LAB — CBC WITH DIFFERENTIAL/PLATELET
BASOS: 1 %
Basophils Absolute: 0 10*3/uL (ref 0.0–0.2)
EOS (ABSOLUTE): 0.3 10*3/uL (ref 0.0–0.4)
EOS: 5 %
HEMATOCRIT: 36.2 % — AB (ref 37.5–51.0)
Hemoglobin: 11.7 g/dL — ABNORMAL LOW (ref 13.0–17.7)
IMMATURE GRANS (ABS): 0 10*3/uL (ref 0.0–0.1)
IMMATURE GRANULOCYTES: 0 %
Lymphocytes Absolute: 1.5 10*3/uL (ref 0.7–3.1)
Lymphs: 23 %
MCH: 30.8 pg (ref 26.6–33.0)
MCHC: 32.3 g/dL (ref 31.5–35.7)
MCV: 95 fL (ref 79–97)
MONOS ABS: 0.6 10*3/uL (ref 0.1–0.9)
Monocytes: 9 %
Neutrophils Absolute: 4 10*3/uL (ref 1.4–7.0)
Neutrophils: 62 %
PLATELETS: 152 10*3/uL (ref 150–450)
RBC: 3.8 x10E6/uL — ABNORMAL LOW (ref 4.14–5.80)
RDW: 14.3 % (ref 12.3–15.4)
WBC: 6.4 10*3/uL (ref 3.4–10.8)

## 2018-07-26 LAB — TSH: TSH: 6.05 u[IU]/mL — ABNORMAL HIGH (ref 0.450–4.500)

## 2018-07-30 ENCOUNTER — Other Ambulatory Visit: Payer: Self-pay

## 2018-07-30 DIAGNOSIS — R9439 Abnormal result of other cardiovascular function study: Secondary | ICD-10-CM

## 2018-07-31 ENCOUNTER — Telehealth: Payer: Self-pay | Admitting: *Deleted

## 2018-07-31 NOTE — Telephone Encounter (Addendum)
Pt contacted pre-catheterization scheduled at [facility] for: Thursday August 02, 2018 1 PM Verified arrival time and place: [place] Entrance A at: 8 AM-pre-procedure hydration  No solid food after midnight prior to cath, clear liquids until 5 AM day of procedure. Verified no known allergies.  Hold: 1/2 Insulin PM prior to procedure.  Furosemide -AM of procedure. KCl -AM of procedure  Except hold medications AM meds can be  taken pre-cath with sip of water including: ASA 81 mg  Confirmed patient has responsible person to drive home post procedure and for 24 hours after you arrive home: yes

## 2018-08-01 ENCOUNTER — Encounter (INDEPENDENT_AMBULATORY_CARE_PROVIDER_SITE_OTHER): Payer: Medicare Other | Admitting: Ophthalmology

## 2018-08-01 ENCOUNTER — Other Ambulatory Visit: Payer: Self-pay | Admitting: Cardiovascular Disease

## 2018-08-01 ENCOUNTER — Other Ambulatory Visit: Payer: Self-pay | Admitting: Family Medicine

## 2018-08-01 DIAGNOSIS — H318 Other specified disorders of choroid: Secondary | ICD-10-CM

## 2018-08-01 DIAGNOSIS — I10 Essential (primary) hypertension: Secondary | ICD-10-CM | POA: Diagnosis not present

## 2018-08-01 DIAGNOSIS — H35033 Hypertensive retinopathy, bilateral: Secondary | ICD-10-CM

## 2018-08-01 DIAGNOSIS — H43813 Vitreous degeneration, bilateral: Secondary | ICD-10-CM | POA: Diagnosis not present

## 2018-08-01 DIAGNOSIS — H4423 Degenerative myopia, bilateral: Secondary | ICD-10-CM

## 2018-08-01 DIAGNOSIS — E1142 Type 2 diabetes mellitus with diabetic polyneuropathy: Secondary | ICD-10-CM

## 2018-08-02 ENCOUNTER — Other Ambulatory Visit: Payer: Self-pay

## 2018-08-02 ENCOUNTER — Encounter (HOSPITAL_COMMUNITY)
Admission: RE | Disposition: A | Discharge status: Home / Self Care | Payer: Self-pay | Source: Ambulatory Visit | Attending: Cardiovascular Disease

## 2018-08-02 ENCOUNTER — Ambulatory Visit (HOSPITAL_COMMUNITY)
Admission: RE | Admit: 2018-08-02 | Discharge: 2018-08-04 | Disposition: A | Discharge status: Home / Self Care | Payer: Medicare Other | Source: Ambulatory Visit | Attending: Cardiovascular Disease | Admitting: Cardiovascular Disease

## 2018-08-02 DIAGNOSIS — Z9049 Acquired absence of other specified parts of digestive tract: Secondary | ICD-10-CM | POA: Insufficient documentation

## 2018-08-02 DIAGNOSIS — I25810 Atherosclerosis of coronary artery bypass graft(s) without angina pectoris: Secondary | ICD-10-CM | POA: Insufficient documentation

## 2018-08-02 DIAGNOSIS — E785 Hyperlipidemia, unspecified: Secondary | ICD-10-CM | POA: Diagnosis not present

## 2018-08-02 DIAGNOSIS — I2510 Atherosclerotic heart disease of native coronary artery without angina pectoris: Secondary | ICD-10-CM | POA: Diagnosis present

## 2018-08-02 DIAGNOSIS — I480 Paroxysmal atrial fibrillation: Secondary | ICD-10-CM

## 2018-08-02 DIAGNOSIS — E1122 Type 2 diabetes mellitus with diabetic chronic kidney disease: Secondary | ICD-10-CM | POA: Insufficient documentation

## 2018-08-02 DIAGNOSIS — N184 Chronic kidney disease, stage 4 (severe): Secondary | ICD-10-CM | POA: Diagnosis not present

## 2018-08-02 DIAGNOSIS — I130 Hypertensive heart and chronic kidney disease with heart failure and stage 1 through stage 4 chronic kidney disease, or unspecified chronic kidney disease: Secondary | ICD-10-CM | POA: Insufficient documentation

## 2018-08-02 DIAGNOSIS — Z951 Presence of aortocoronary bypass graft: Secondary | ICD-10-CM | POA: Diagnosis not present

## 2018-08-02 DIAGNOSIS — I200 Unstable angina: Secondary | ICD-10-CM

## 2018-08-02 DIAGNOSIS — Z8719 Personal history of other diseases of the digestive system: Secondary | ICD-10-CM | POA: Insufficient documentation

## 2018-08-02 DIAGNOSIS — Z87891 Personal history of nicotine dependence: Secondary | ICD-10-CM | POA: Insufficient documentation

## 2018-08-02 DIAGNOSIS — I5033 Acute on chronic diastolic (congestive) heart failure: Secondary | ICD-10-CM | POA: Diagnosis not present

## 2018-08-02 DIAGNOSIS — I25110 Atherosclerotic heart disease of native coronary artery with unstable angina pectoris: Secondary | ICD-10-CM | POA: Insufficient documentation

## 2018-08-02 DIAGNOSIS — R9439 Abnormal result of other cardiovascular function study: Secondary | ICD-10-CM

## 2018-08-02 DIAGNOSIS — Z79899 Other long term (current) drug therapy: Secondary | ICD-10-CM | POA: Diagnosis not present

## 2018-08-02 DIAGNOSIS — Z9889 Other specified postprocedural states: Secondary | ICD-10-CM | POA: Insufficient documentation

## 2018-08-02 DIAGNOSIS — I252 Old myocardial infarction: Secondary | ICD-10-CM | POA: Diagnosis not present

## 2018-08-02 DIAGNOSIS — I4821 Permanent atrial fibrillation: Secondary | ICD-10-CM

## 2018-08-02 HISTORY — PX: LEFT HEART CATH AND CORONARY ANGIOGRAPHY: CATH118249

## 2018-08-02 LAB — GLUCOSE, CAPILLARY
GLUCOSE-CAPILLARY: 138 mg/dL — AB (ref 70–99)
GLUCOSE-CAPILLARY: 129 mg/dL — AB (ref 70–99)

## 2018-08-02 LAB — POCT ACTIVATED CLOTTING TIME
ACTIVATED CLOTTING TIME: 428 s
ACTIVATED CLOTTING TIME: 169 s

## 2018-08-02 SURGERY — LEFT HEART CATH AND CORONARY ANGIOGRAPHY
Anesthesia: LOCAL

## 2018-08-02 MED ORDER — SODIUM CHLORIDE 0.9 % IV SOLN: 250.0000 mL | INTRAVENOUS | Status: DC | PRN

## 2018-08-02 MED ORDER — LIDOCAINE HCL (PF) 1 % IJ SOLN
INTRAMUSCULAR | Status: AC
  Filled 2018-08-02: qty 30

## 2018-08-02 MED ORDER — LIDOCAINE HCL (PF) 1 % IJ SOLN
INTRAMUSCULAR | Status: DC | PRN
  Administered 2018-08-02: 15 mL via INTRADERMAL

## 2018-08-02 MED ORDER — ASPIRIN EC 81 MG PO TBEC
81.0000 mg | DELAYED_RELEASE_TABLET | Freq: Every day | ORAL | Status: DC
  Administered 2018-08-03 – 2018-08-04 (×2): 81 mg via ORAL
  Filled 2018-08-02 (×2): qty 1

## 2018-08-02 MED ORDER — HEPARIN (PORCINE) IN NACL 1000-0.9 UT/500ML-% IV SOLN
INTRAVENOUS | Status: AC
  Filled 2018-08-02: qty 1000

## 2018-08-02 MED ORDER — BIVALIRUDIN TRIFLUOROACETATE 250 MG IV SOLR
INTRAVENOUS | Status: AC
  Filled 2018-08-02: qty 250

## 2018-08-02 MED ORDER — ATORVASTATIN CALCIUM 80 MG PO TABS
80.0000 mg | ORAL_TABLET | Freq: Every day | ORAL | Status: DC
  Administered 2018-08-02 – 2018-08-03 (×2): 80 mg via ORAL
  Filled 2018-08-02 (×2): qty 1

## 2018-08-02 MED ORDER — LABETALOL HCL 5 MG/ML IV SOLN
10.0000 mg | INTRAVENOUS | Status: AC | PRN
  Administered 2018-08-02: 18:00:00 10 mg via INTRAVENOUS
  Filled 2018-08-02: qty 4

## 2018-08-02 MED ORDER — POTASSIUM GLUCONATE 595 MG PO CAPS: 595.0000 mg | ORAL_CAPSULE | Freq: Every day | ORAL | Status: DC

## 2018-08-02 MED ORDER — BIVALIRUDIN BOLUS VIA INFUSION - CUPID
INTRAVENOUS | Status: DC | PRN
  Administered 2018-08-02: 67.725 mg via INTRAVENOUS

## 2018-08-02 MED ORDER — ASPIRIN 81 MG PO CHEW: 81.0000 mg | CHEWABLE_TABLET | ORAL | Status: DC

## 2018-08-02 MED ORDER — SODIUM CHLORIDE 0.9% FLUSH: 3.0000 mL | INTRAVENOUS | Status: DC | PRN

## 2018-08-02 MED ORDER — IOHEXOL 350 MG/ML SOLN
INTRAVENOUS | Status: DC | PRN
  Administered 2018-08-02: 135 mL via INTRA_ARTERIAL

## 2018-08-02 MED ORDER — ASPIRIN 81 MG PO CHEW: 81.0000 mg | CHEWABLE_TABLET | Freq: Every day | ORAL | Status: DC

## 2018-08-02 MED ORDER — MORPHINE SULFATE (PF) 2 MG/ML IV SOLN: 2.0000 mg | INTRAVENOUS | Status: DC | PRN

## 2018-08-02 MED ORDER — SODIUM CHLORIDE 0.9% FLUSH
3.0000 mL | INTRAVENOUS | Status: DC | PRN
  Administered 2018-08-02 – 2018-08-03 (×2): 3 mL via INTRAVENOUS
  Filled 2018-08-02 (×2): qty 3

## 2018-08-02 MED ORDER — RANOLAZINE ER 500 MG PO TB12
500.0000 mg | ORAL_TABLET | Freq: Two times a day (BID) | ORAL | Status: DC
  Administered 2018-08-02 – 2018-08-04 (×4): 500 mg via ORAL
  Filled 2018-08-02 (×4): qty 1

## 2018-08-02 MED ORDER — ONDANSETRON HCL 4 MG/2ML IJ SOLN
4.0000 mg | Freq: Four times a day (QID) | INTRAMUSCULAR | Status: DC | PRN
  Filled 2018-08-02: qty 2

## 2018-08-02 MED ORDER — ROSUVASTATIN CALCIUM 20 MG PO TABS: 20.0000 mg | ORAL_TABLET | Freq: Every day | ORAL | Status: DC

## 2018-08-02 MED ORDER — COLCHICINE 0.6 MG PO TABS: 0.6000 mg | ORAL_TABLET | Freq: Every day | ORAL | Status: DC | PRN

## 2018-08-02 MED ORDER — ACETAMINOPHEN 325 MG PO TABS: 650.0000 mg | ORAL_TABLET | ORAL | Status: DC | PRN

## 2018-08-02 MED ORDER — SODIUM CHLORIDE 0.9% FLUSH: 3.0000 mL | Freq: Two times a day (BID) | INTRAVENOUS | Status: DC

## 2018-08-02 MED ORDER — HEPARIN (PORCINE) IN NACL 1000-0.9 UT/500ML-% IV SOLN
INTRAVENOUS | Status: DC | PRN
  Administered 2018-08-02 (×2): 500 mL

## 2018-08-02 MED ORDER — SODIUM CHLORIDE 0.9 % IV SOLN
INTRAVENOUS | Status: AC
  Administered 2018-08-02: 18:00:00 via INTRAVENOUS

## 2018-08-02 MED ORDER — HYDRALAZINE HCL 20 MG/ML IJ SOLN
5.0000 mg | INTRAMUSCULAR | Status: AC | PRN
  Administered 2018-08-02 (×2): 5 mg via INTRAVENOUS
  Filled 2018-08-02: qty 1

## 2018-08-02 MED ORDER — FUROSEMIDE 80 MG PO TABS
80.0000 mg | ORAL_TABLET | Freq: Every day | ORAL | Status: DC
  Administered 2018-08-04: 80 mg via ORAL
  Filled 2018-08-02 (×2): qty 1

## 2018-08-02 MED ORDER — TRAMADOL HCL 50 MG PO TABS
50.0000 mg | ORAL_TABLET | Freq: Four times a day (QID) | ORAL | Status: DC
  Administered 2018-08-03 – 2018-08-04 (×6): 100 mg via ORAL
  Filled 2018-08-02: qty 2
  Filled 2018-08-02: qty 1
  Filled 2018-08-02: qty 2
  Filled 2018-08-02 (×2): qty 1
  Filled 2018-08-02 (×3): qty 2

## 2018-08-02 MED ORDER — METOPROLOL TARTRATE 50 MG PO TABS
50.0000 mg | ORAL_TABLET | Freq: Two times a day (BID) | ORAL | Status: DC
  Administered 2018-08-02 – 2018-08-04 (×4): 50 mg via ORAL
  Filled 2018-08-02: qty 2
  Filled 2018-08-02: qty 1
  Filled 2018-08-02: qty 2
  Filled 2018-08-02: qty 1

## 2018-08-02 MED ORDER — SODIUM CHLORIDE 0.9% FLUSH
3.0000 mL | Freq: Two times a day (BID) | INTRAVENOUS | Status: DC
  Administered 2018-08-03 – 2018-08-04 (×3): 3 mL via INTRAVENOUS

## 2018-08-02 MED ORDER — SODIUM CHLORIDE 0.9 % IV SOLN
INTRAVENOUS | Status: DC | PRN
  Administered 2018-08-02: 0.25 mg/kg/h via INTRAVENOUS

## 2018-08-02 MED ORDER — SODIUM CHLORIDE 0.9 % WEIGHT BASED INFUSION: 1.0000 mL/kg/h | INTRAVENOUS | Status: DC

## 2018-08-02 MED ORDER — TICAGRELOR 90 MG PO TABS
ORAL_TABLET | ORAL | Status: DC | PRN
  Administered 2018-08-02: 180 mg via ORAL

## 2018-08-02 MED ORDER — NITROGLYCERIN 0.4 MG SL SUBL: 0.4000 mg | SUBLINGUAL_TABLET | SUBLINGUAL | Status: DC | PRN

## 2018-08-02 MED ORDER — ISOSORBIDE MONONITRATE ER 60 MG PO TB24
60.0000 mg | ORAL_TABLET | Freq: Two times a day (BID) | ORAL | Status: DC
  Administered 2018-08-02 – 2018-08-04 (×4): 60 mg via ORAL
  Filled 2018-08-02 (×4): qty 1

## 2018-08-02 MED ORDER — SODIUM CHLORIDE 0.9 % WEIGHT BASED INFUSION
3.0000 mL/kg/h | INTRAVENOUS | Status: DC
  Administered 2018-08-02: 3 mL/kg/h via INTRAVENOUS

## 2018-08-02 SURGICAL SUPPLY — 19 items
CATH INFINITI 5 FR IM (CATHETERS) ×1 IMPLANT
CATH INFINITI 5FR MULTPACK ANG (CATHETERS) ×1 IMPLANT
CATH SUPERCROSS ANGLED 90 DEG (MICROCATHETER) ×1 IMPLANT
CATH VISTA GUIDE 6FR XB3.5 (CATHETERS) ×1 IMPLANT
DEVICE CONTINUOUS FLUSH (MISCELLANEOUS) ×1 IMPLANT
KIT ENCORE 26 ADVANTAGE (KITS) ×1 IMPLANT
KIT HEART LEFT (KITS) ×2 IMPLANT
PACK CARDIAC CATHETERIZATION (CUSTOM PROCEDURE TRAY) ×2 IMPLANT
SHEATH PINNACLE 5F 10CM (SHEATH) ×1 IMPLANT
SHEATH PINNACLE 6F 10CM (SHEATH) ×1 IMPLANT
SYR MEDRAD MARK V 150ML (SYRINGE) ×2 IMPLANT
TRANSDUCER W/STOPCOCK (MISCELLANEOUS) ×2 IMPLANT
TUBING CIL FLEX 10 FLL-RA (TUBING) ×2 IMPLANT
WIRE ASAHI PROWATER 180CM (WIRE) ×2 IMPLANT
WIRE ASAHI PROWATER 300CM (WIRE) ×1 IMPLANT
WIRE COUGAR XT STRL 300CM (WIRE) ×1 IMPLANT
WIRE EMERALD 3MM-J .035X150CM (WIRE) ×1 IMPLANT
WIRE EMERALD 3MM-J .035X260CM (WIRE) ×1 IMPLANT
WIRE RUNTHROUGH .014X180CM (WIRE) ×1 IMPLANT

## 2018-08-02 NOTE — Progress Notes (Signed)
Site area: right groin  Site Prior to Removal:  Level 0  Pressure Applied For 15 MINUTES    Minutes Beginning at 19:55  Manual:   Yes.    Patient Status During Pull:  WNL  Post Pull Groin Site:  Level 0  Post Pull Instructions Given:  Yes.    Post Pull Pulses Present:  Yes.    Dressing Applied:  Yes.    Comments:  Pt tolerated well.

## 2018-08-02 NOTE — Interval H&P Note (Signed)
Cath Lab Visit (complete for each Cath Lab visit)  Clinical Evaluation Leading to the Procedure:   ACS: No.  Non-ACS:    Anginal Classification: CCS II  Anti-ischemic medical therapy: Maximal Therapy (2 or more classes of medications)  Non-Invasive Test Results: Intermediate-risk stress test findings: cardiac mortality 1-3%/year  Prior CABG: Previous CABG      History and Physical Interval Note:  08/02/2018 3:09 PM  [name]  has presented today for surgery, with the diagnosis of abnormal echo and stress test  The various methods of treatment have been discussed with the patient and family. After consideration of risks, benefits and other options for treatment, the patient has consented to  Procedure(s): LEFT HEART CATH AND CORONARY ANGIOGRAPHY (N/A) as a surgical intervention .  The patient's history has been reviewed, patient examined, no change in status, stable for surgery.  I have reviewed the patient's chart and labs.  Questions were answered to the patient's satisfaction.     Quay Burow

## 2018-08-03 ENCOUNTER — Encounter (HOSPITAL_COMMUNITY): Payer: Self-pay | Admitting: Cardiovascular Disease

## 2018-08-03 DIAGNOSIS — I480 Paroxysmal atrial fibrillation: Secondary | ICD-10-CM

## 2018-08-03 DIAGNOSIS — I5033 Acute on chronic diastolic (congestive) heart failure: Secondary | ICD-10-CM | POA: Diagnosis not present

## 2018-08-03 DIAGNOSIS — Z87891 Personal history of nicotine dependence: Secondary | ICD-10-CM | POA: Diagnosis not present

## 2018-08-03 DIAGNOSIS — I130 Hypertensive heart and chronic kidney disease with heart failure and stage 1 through stage 4 chronic kidney disease, or unspecified chronic kidney disease: Secondary | ICD-10-CM | POA: Diagnosis not present

## 2018-08-03 DIAGNOSIS — I25810 Atherosclerosis of coronary artery bypass graft(s) without angina pectoris: Secondary | ICD-10-CM | POA: Diagnosis not present

## 2018-08-03 DIAGNOSIS — I200 Unstable angina: Secondary | ICD-10-CM

## 2018-08-03 DIAGNOSIS — Z951 Presence of aortocoronary bypass graft: Secondary | ICD-10-CM | POA: Diagnosis not present

## 2018-08-03 DIAGNOSIS — N184 Chronic kidney disease, stage 4 (severe): Secondary | ICD-10-CM | POA: Diagnosis not present

## 2018-08-03 DIAGNOSIS — I25110 Atherosclerotic heart disease of native coronary artery with unstable angina pectoris: Secondary | ICD-10-CM | POA: Diagnosis not present

## 2018-08-03 DIAGNOSIS — I4821 Permanent atrial fibrillation: Secondary | ICD-10-CM

## 2018-08-03 DIAGNOSIS — I252 Old myocardial infarction: Secondary | ICD-10-CM | POA: Diagnosis not present

## 2018-08-03 DIAGNOSIS — E1122 Type 2 diabetes mellitus with diabetic chronic kidney disease: Secondary | ICD-10-CM | POA: Diagnosis not present

## 2018-08-03 DIAGNOSIS — E785 Hyperlipidemia, unspecified: Secondary | ICD-10-CM | POA: Diagnosis not present

## 2018-08-03 DIAGNOSIS — Z8719 Personal history of other diseases of the digestive system: Secondary | ICD-10-CM | POA: Diagnosis not present

## 2018-08-03 LAB — CBC
HCT: 35.2 % — ABNORMAL LOW (ref 39.0–52.0)
Hemoglobin: 11 g/dL — ABNORMAL LOW (ref 13.0–17.0)
MCH: 30.6 pg (ref 26.0–34.0)
MCHC: 31.3 g/dL (ref 30.0–36.0)
MCV: 98.1 fL (ref 78.0–100.0)
PLATELETS: 137 10*3/uL — AB (ref 150–400)
RBC: 3.59 MIL/uL — ABNORMAL LOW (ref 4.22–5.81)
RDW: 15.5 % (ref 11.5–15.5)
WBC: 8.7 10*3/uL (ref 4.0–10.5)

## 2018-08-03 LAB — BASIC METABOLIC PANEL
Anion gap: 9 (ref 5–15)
BUN: 22 mg/dL (ref 8–23)
CHLORIDE: 107 mmol/L (ref 98–111)
CO2: 25 mmol/L (ref 22–32)
CREATININE: 1.84 mg/dL — AB (ref 0.61–1.24)
Calcium: 9.1 mg/dL (ref 8.9–10.3)
GFR calc Af Amer: 37 mL/min — ABNORMAL LOW (ref >60)
GFR, EST NON AFRICAN AMERICAN: 32 mL/min — AB (ref >60)
GLUCOSE: 138 mg/dL — AB (ref 70–99)
Potassium: 4.3 mmol/L (ref 3.5–5.1)
SODIUM: 141 mmol/L (ref 135–145)

## 2018-08-03 LAB — GLUCOSE, CAPILLARY
GLUCOSE-CAPILLARY: 143 mg/dL — AB (ref 70–99)
Glucose-Capillary: 119 mg/dL — ABNORMAL HIGH (ref 70–99)
Glucose-Capillary: 126 mg/dL — ABNORMAL HIGH (ref 70–99)
Glucose-Capillary: 203 mg/dL — ABNORMAL HIGH (ref 70–99)
Glucose-Capillary: 131 mg/dL — ABNORMAL HIGH (ref 70–99)

## 2018-08-03 MED ORDER — FUROSEMIDE 10 MG/ML IJ SOLN
80.0000 mg | Freq: Once | INTRAMUSCULAR | Status: AC
  Administered 2018-08-03: 10:00:00 80 mg via INTRAVENOUS
  Filled 2018-08-03: qty 8

## 2018-08-03 MED ORDER — AMLODIPINE BESYLATE 5 MG PO TABS
5.0000 mg | ORAL_TABLET | Freq: Every day | ORAL | Status: DC
  Administered 2018-08-03 – 2018-08-04 (×2): 5 mg via ORAL
  Filled 2018-08-03 (×2): qty 1

## 2018-08-03 MED ORDER — APIXABAN 2.5 MG PO TABS
2.5000 mg | ORAL_TABLET | Freq: Two times a day (BID) | ORAL | Status: DC
  Administered 2018-08-03 – 2018-08-04 (×3): 2.5 mg via ORAL
  Filled 2018-08-03 (×5): qty 1

## 2018-08-03 MED ORDER — INSULIN ASPART 100 UNIT/ML ~~LOC~~ SOLN
0.0000 [IU] | Freq: Three times a day (TID) | SUBCUTANEOUS | Status: DC
  Administered 2018-08-03: 5 [IU] via SUBCUTANEOUS
  Administered 2018-08-03 – 2018-08-04 (×2): 2 [IU] via SUBCUTANEOUS

## 2018-08-03 NOTE — Discharge Instructions (Signed)
Information on my medicine - ELIQUIS (apixaban)  This medication education was reviewed with me or my healthcare representative as part of my discharge preparation.  The pharmacist that spoke with me during my hospital stay was:  Laasya Peyton  Why was Eliquis prescribed for you? Eliquis was prescribed for you to reduce the risk of forming blood clots that can cause a stroke if you have a medical condition called atrial fibrillation (a type of irregular heartbeat) OR to reduce the risk of a blood clots forming after orthopedic surgery.  What do You need to know about Eliquis ? Take your Eliquis TWICE DAILY - one tablet in the morning and one tablet in the evening with or without food.  It would be best to take the doses about the same time each day.  If you have difficulty swallowing the tablet whole please discuss with your pharmacist how to take the medication safely.  Take Eliquis exactly as prescribed by your doctor and DO NOT stop taking Eliquis without talking to the doctor who prescribed the medication.  Stopping may increase your risk of developing a new clot or stroke.  Refill your prescription before you run out.  After discharge, you should have regular check-up appointments with your healthcare provider that is prescribing your Eliquis.  In the future your dose may need to be changed if your kidney function or weight changes by a significant amount or as you get older.  What do you do if you miss a dose? If you miss a dose, take it as soon as you remember on the same day and resume taking twice daily.  Do not take more than one dose of ELIQUIS at the same time.  Important Safety Information A possible side effect of Eliquis is bleeding. You should call your healthcare provider right away if you experience any of the following: ? Bleeding from an injury or your nose that does not stop. ? Unusual colored urine (red or dark brown) or unusual colored stools (red or black). ? Unusual  bruising for unknown reasons. ? A serious fall or if you hit your head (even if there is no bleeding).  Some medicines may interact with Eliquis and might increase your risk of bleeding or clotting while on Eliquis. To help avoid this, consult your healthcare provider or pharmacist prior to using any new prescription or non-prescription medications, including herbals, vitamins, non-steroidal anti-inflammatory drugs (NSAIDs) and supplements.  This website has more information on Eliquis (apixaban): [email or web address].

## 2018-08-03 NOTE — Progress Notes (Addendum)
Progress Note  Patient Name: [name] Date of Encounter: 08/03/2018  Primary Cardiologist: Quay Burow, MD Subjective   Intermittent dyspnea but no chest pressure here.  Inpatient Medications    Scheduled Meds: . aspirin EC  81 mg Oral Daily  . atorvastatin  80 mg Oral q1800  . furosemide  80 mg Oral Daily  . isosorbide mononitrate  60 mg Oral BID WC  . metoprolol tartrate  50 mg Oral BID  . ranolazine  500 mg Oral BID  . sodium chloride flush  3 mL Intravenous Q12H  . traMADol  50-100 mg Oral Q6H   Continuous Infusions: . sodium chloride     PRN Meds: sodium chloride, acetaminophen, colchicine, morphine injection, nitroGLYCERIN, ondansetron (ZOFRAN) IV, sodium chloride flush   Vital Signs    Vitals:   08/03/18 0400 08/03/18 0500 08/03/18 0530 08/03/18 0720  BP: (!) 124/49 (!) 138/58 138/60 139/67  Pulse: 64 89 71 72  Resp: (!) 25 12 14 19   Temp:   99.1 F (37.3 C) 98.5 F (36.9 C)  TempSrc:   Oral   SpO2: 98% 99% 99% 99%  Weight:   90.8 kg   Height:        Intake/Output Summary (Last 24 hours) at 08/03/2018 0817 Last data filed at 08/03/2018 0500 Gross per 24 hour  Intake 775.34 ml  Output 700 ml  Net 75.34 ml   Filed Weights   08/02/18 0818 08/03/18 0530  Weight: 90.3 kg 90.8 kg    Telemetry    Atrial fibrillation at rate of 80- Personally Reviewed  ECG    Atrial fibrillation with right bundle branch block which is chronic- Personally Reviewed  Physical Exam   GEN: No acute distress.   Neck: No JVD Cardiac:  Irregularly irregular, no murmurs, rubs, or gallops.  Right groin cath site without hematoma. Respiratory: Clear to auscultation bilaterally. GI: Soft, nontender, non-distended C MS: No edema; No deformity. Neuro:  Nonfocal  Psych: Normal affect   Labs    Chemistry Recent Labs  Lab 08/03/18 0501  NA 141  K 4.3  CL 107  CO2 25  GLUCOSE 138*  BUN 22  CREATININE 1.84*  CALCIUM 9.1  GFRNONAA 32*  GFRAA 37*    ANIONGAP 9     Hematology Recent Labs  Lab 08/03/18 0501  WBC 8.7  RBC 3.59*  HGB 11.0*  HCT 35.2*  MCV 98.1  MCH 30.6  MCHC 31.3  RDW 15.5  PLT 137*    Radiology    No results found.  Cardiac Studies   LEFT HEART CATH AND CORONARY ANGIOGRAPHY  Conclusion     Ost RCA to Prox RCA lesion is 100% stenosed.  Ost LAD to Prox LAD lesion is 100% stenosed.  Ost Cx lesion is 70% stenosed.  Ost 1st Mrg lesion is 90% stenosed.  Prox Cx lesion is 60% stenosed.  Origin to Prox Graft lesion is 100% stenosed.  Previously placed Origin to Mid Graft stent (unknown type) is widely patent.       IMPRESSION: Mr. Mcclaine has an occluded RCA vein graft with left-to-right collaterals which is a new finding since his last cath 4 years ago.  Proximal circumflex and obtuse marginal branch have progressed but unfortunately was not able to cross the Obtuse Marginal Branch Ostium.  This came out from the AV groove had a right angle and was high-grade and calcified.  At this point, I recommend aggressive antianginal medical therapy.  Angiomax was discontinued.  Sheath will be removed and pressure held.  Patient be hydrated and discharged home in the morning.  He left the lab in stable condition.  Diagnostic Diagram        Patient Profile     84 y.o. male with past medical history of severe diverticulosis, multiple GI bleeds since 1981, carotid artery disease s/p R CEA by [name] '94, HTN, HLD, DM, CKD stage IV, CAD- s/p CABG 1996 with redo 2006, SVG-OM DES Aug 2015, NSTEMI Jan 2017 by Troponin and echo, and h/o PAF- not on coumadin due to recurrent GI bleeds. He presented 01/03/16 with recurrent GI bleed. He underwent Lt hemicolectomy 12/16/61 complicated by by peri-op NSTEMI (Troponin peak-16 on 01/16/16). Plan is for medical Rx.   Recently seen by Dr. Alvester Chou for progressive worsening dyspnea and chest pain.  Follow-up 2D echo showed new LV regional wall motion abnormality with reduced  function to 45 to 50%.  PA pressure of 47 mmHg.  Left atrium severely dilated. Stress test showed dense inferolateral scar with mild peri-infarct ischemia new since this previous Myoview performed in 2015.    Assessment & Plan    1.  Unstable angina with history of redo CABG -Catheter as above.  Unable to cross wire at  obtuse marginal branch ostium.  Plan for aggressive medical therapy. -Recent already on Imdur 60 mg twice daily (max dose) and Ranexa 500 mg twice daily (unable to uptitrate due to CKD). -Continue aspirin 81 mg, Lipitor 80 mg and Lopressor 50 mg twice daily. -Will review with MD.  He has intermittent dyspnea while here.  No pressure.  2.  Paroxysmal atrial fibrillation -He is back in atrial fibrillation.  Rate control at 80s.  Continue beta-blocker. CHADSVASC score of 6.  He is not on anticoagulation due to recurrent GI bleed eventually requiring colectomy. He denies any bleeding since then.  Anticoagulation per MD.  3.  Chronic kidney disease stage III/IV -Serum creatinine improved with hydration overnight.  4.  Hypertension -Blood pressure stable on current medication  5. HLD = 05/09/2018: Cholesterol 105; HDL 29.30; LDL Cholesterol 57; Triglycerides 94.0; VLDL 18.8  -Continue Lipitor.    For questions or updates, please contact [place] Please consult [email or web address] for contact info under        Signed, Leanor Kail, PA  08/03/2018, 8:17 AM    I have personally seen and examined this patient. I agree with the assessment and plan as outlined above.  He is stable this am. No chest pain. No good options for PCI in the cath lab yesterday. Unable to intervene on OM. Will continue Imdur, Ranexa and beta blocker. Will add amlodipine. He ambulated with cardiac rehab and became dyspneic. Will given one dose IV lasix this am. Monitor overnight.  In regards to AF, will add Eliquis 2.5 mg po BID. Remote GI bleeding due to diverticular disease but he is now s/p  colectomy and has had no bleeding since.    Lauree Chandler 08/03/2018 9:04 AM

## 2018-08-03 NOTE — Progress Notes (Signed)
CARDIAC REHAB PHASE I   PRE:  Rate/Rhythm: 74 afbi    BP: sitting 138/61    SaO2: 95 RA  MODE:  Ambulation: 220 ft   POST:  Rate/Rhythm: 120-140 afib    BP: sitting 131/69     SaO2: 96 RA  Pt stiff getting up. Began to be tired and dyspneic after 80 ft, rest x3. HR up to 120s, brief 140. Pt not able to talk and walk. Denied his "hot poker" angina. Resolved with rest. Discussed NTG, diet, Eliquis and Afib, trying to increase activity as tolerated. S/o and pt voiced understanding. Gave HF booklet and Off the Beat book. Encouraged pt to walk with staff after Lasix. Will f/u tomorrow. [place], ACSM 08/03/2018 9:48 AM

## 2018-08-04 ENCOUNTER — Other Ambulatory Visit: Payer: Self-pay | Admitting: Family Medicine

## 2018-08-04 DIAGNOSIS — I25810 Atherosclerosis of coronary artery bypass graft(s) without angina pectoris: Secondary | ICD-10-CM | POA: Diagnosis not present

## 2018-08-04 DIAGNOSIS — N184 Chronic kidney disease, stage 4 (severe): Secondary | ICD-10-CM | POA: Diagnosis not present

## 2018-08-04 DIAGNOSIS — I5033 Acute on chronic diastolic (congestive) heart failure: Secondary | ICD-10-CM | POA: Diagnosis not present

## 2018-08-04 DIAGNOSIS — E1122 Type 2 diabetes mellitus with diabetic chronic kidney disease: Secondary | ICD-10-CM | POA: Diagnosis not present

## 2018-08-04 DIAGNOSIS — I25110 Atherosclerotic heart disease of native coronary artery with unstable angina pectoris: Secondary | ICD-10-CM | POA: Diagnosis not present

## 2018-08-04 DIAGNOSIS — I130 Hypertensive heart and chronic kidney disease with heart failure and stage 1 through stage 4 chronic kidney disease, or unspecified chronic kidney disease: Secondary | ICD-10-CM | POA: Diagnosis not present

## 2018-08-04 LAB — CBC
HEMATOCRIT: 34.4 % — AB (ref 39.0–52.0)
HEMOGLOBIN: 10.5 g/dL — AB (ref 13.0–17.0)
MCH: 30.1 pg (ref 26.0–34.0)
MCHC: 30.5 g/dL (ref 30.0–36.0)
MCV: 98.6 fL (ref 78.0–100.0)
Platelets: 118 10*3/uL — ABNORMAL LOW (ref 150–400)
RBC: 3.49 MIL/uL — AB (ref 4.22–5.81)
RDW: 15.6 % — ABNORMAL HIGH (ref 11.5–15.5)
WBC: 7.6 10*3/uL (ref 4.0–10.5)

## 2018-08-04 LAB — GLUCOSE, CAPILLARY: GLUCOSE-CAPILLARY: 142 mg/dL — AB (ref 70–99)

## 2018-08-04 LAB — BASIC METABOLIC PANEL
ANION GAP: 9 (ref 5–15)
BUN: 28 mg/dL — ABNORMAL HIGH (ref 8–23)
CALCIUM: 9.1 mg/dL (ref 8.9–10.3)
CHLORIDE: 104 mmol/L (ref 98–111)
CO2: 27 mmol/L (ref 22–32)
Creatinine, Ser: 2.15 mg/dL — ABNORMAL HIGH (ref 0.61–1.24)
GFR calc Af Amer: 31 mL/min — ABNORMAL LOW (ref >60)
GFR calc non Af Amer: 27 mL/min — ABNORMAL LOW (ref >60)
Glucose, Bld: 133 mg/dL — ABNORMAL HIGH (ref 70–99)
POTASSIUM: 3.9 mmol/L (ref 3.5–5.1)
Sodium: 140 mmol/L (ref 135–145)

## 2018-08-04 MED ORDER — ROSUVASTATIN CALCIUM 40 MG PO TABS: 40.0000 mg | ORAL_TABLET | Freq: Every day | ORAL | 3 refills | Status: DC

## 2018-08-04 MED ORDER — APIXABAN 2.5 MG PO TABS: 2.5000 mg | ORAL_TABLET | Freq: Two times a day (BID) | ORAL | 3 refills | Status: DC

## 2018-08-04 MED ORDER — AMLODIPINE BESYLATE 5 MG PO TABS: 5.0000 mg | ORAL_TABLET | Freq: Every day | ORAL | 3 refills | Status: DC

## 2018-08-04 NOTE — Progress Notes (Signed)
Nsg Discharge Note  Admit Date:  08/02/2018 Discharge date: 08/04/2018   [name] to be D/C'd Home per MD order.  AVS completed.  Copy for chart, and copy for patient signed, and dated. Patient/caregiver able to verbalize understanding.  Discharge Medication: Allergies as of 08/04/2018   No Known Allergies     Medication List    TAKE these medications   amLODipine 5 MG tablet Commonly known as:  NORVASC Take 1 tablet (5 mg total) by mouth daily. Start taking on:  08/05/2018   apixaban 2.5 MG Tabs tablet Commonly known as:  ELIQUIS Take 1 tablet (2.5 mg total) by mouth 2 (two) times daily.   aspirin 81 MG tablet Take 81 mg by mouth daily.   BD INSULIN SYRINGE U/F 31G X 5/16" 0.3 ML Misc Generic drug:  Insulin Syringe-Needle U-100 USE DAILY AS INSTRUCTED   colchicine 0.6 MG tablet Take 1 tablet (0.6 mg total) by mouth daily as needed (gout flare up). What changed:  reasons to take this   fish oil-omega-3 fatty acids 1000 MG capsule Take 1 g by mouth daily.   furosemide 80 MG tablet Commonly known as:  LASIX TAKE 1 TABLET DAILY   glucose blood test strip USE TO TEST BLOOD SUGAR ONCE DAILY AS ANS NEEDED FOR DM (DX. E11.40)   insulin glargine 100 UNIT/ML injection Commonly known as:  LANTUS INJECT 20 TO 25 UNITS AT BEDTIME What changed:    how much to take  how to take this  when to take this  additional instructions   isosorbide mononitrate 60 MG 24 hr tablet Commonly known as:  IMDUR TAKE 1 TABLET TWICE A DAY WITH MEALS What changed:  when to take this   K-99 595 MG Caps Generic drug:  Potassium Gluconate Take 595 mg by mouth daily.   magnesium oxide 400 MG tablet Commonly known as:  MAG-OX Take 400 mg by mouth daily.   metoprolol tartrate 50 MG tablet Commonly known as:  LOPRESSOR Take 1 tablet (50 mg total) by mouth 2 (two) times daily.   multivitamin with minerals Tabs tablet Take 1 tablet by mouth daily.   nitroGLYCERIN 0.4 MG SL  tablet Commonly known as:  NITROSTAT Place 1 tablet (0.4 mg total) under the tongue every 5 (five) minutes as needed for chest pain.   RANEXA 500 MG 12 hr tablet Generic drug:  ranolazine TAKE 1 TABLET TWICE A DAY What changed:  how much to take   rosuvastatin 40 MG tablet Commonly known as:  CRESTOR Take 1 tablet (40 mg total) by mouth at bedtime. What changed:    medication strength  how much to take  when to take this   traMADol 50 MG tablet Commonly known as:  ULTRAM Take 1-2 tablets (50-100 mg total) by mouth every 12 (twelve) hours as needed. What changed:    how much to take  when to take this   Vitamin D 1000 units capsule Take 1,000 Units by mouth daily.       Discharge Assessment: Vitals:   08/04/18 0200 08/04/18 0605  BP: 137/70 131/61  Pulse:  73  Resp:  19  Temp:  98.1 F (36.7 C)  SpO2:  97%   Skin clean, dry and intact without evidence of skin break down, no evidence of skin tears noted. IV catheter discontinued intact. Site without signs and symptoms of complications - no redness or edema noted at insertion site, patient denies c/o pain - only slight tenderness at  site.  Dressing with slight pressure applied.  D/c Instructions-Education: Discharge instructions given to patient/family with verbalized understanding. D/c education completed with patient/family including follow up instructions, medication list, d/c activities limitations if indicated, with other d/c instructions as indicated by MD - patient able to verbalize understanding, all questions fully answered. Patient instructed to return to ED, call 911, or call MD for any changes in condition.  Patient escorted via [place], and D/C home via private auto.  Eda Keys, RN 08/04/2018 10:50 AM

## 2018-08-04 NOTE — Care Management Note (Signed)
Case Management Note  Patient Details  Name: [name] MRN: [id number] Date of Birth: 1932-09-17  Subjective/Objective:                    Action/Plan:  Patient provided with and verbalized understanding of Eliquis 30 day card. He will inquire about copay for next month when it gets it filled at pharmacy. Understands to call MD if co pay is not affordable.   Expected Discharge Date:  08/04/18               Expected Discharge Plan:     In-House Referral:     Discharge planning Services  CM Consult, Medication Assistance  Post Acute Care Choice:    Choice offered to:     DME Arranged:    DME Agency:     HH Arranged:    HH Agency:     Status of Service:  Completed, signed off  If discussed at [employer] of Stay Meetings, dates discussed:    Additional Comments:  Carles Collet, RN 08/04/2018, 9:53 AM

## 2018-08-04 NOTE — Discharge Summary (Signed)
Discharge Summary    Patient ID: [name] MRN: [id number]; DOB: 08/12/1932  Admit date: 08/02/2018 Discharge date: 08/04/2018  Primary Care Provider: Tonia Ghent, MD  Primary Cardiologist: Quay Burow, MD  Primary Electrophysiologist:  None   Discharge Diagnoses    Principal Problem:   Unstable angina [facility]) Active Problems:   Hx of CABG   CAD (coronary artery disease)   Abnormal cardiovascular stress test   PAF (paroxysmal atrial fibrillation) ([name])   Acute on chronic diastolic CHF (congestive heart failure), NYHA class 3 ([place])   Allergies No Known Allergies  Diagnostic Studies/Procedures    Left heart cath 08/02/18:  Ost RCA to Prox RCA lesion is 100% stenosed.  Ost LAD to Prox LAD lesion is 100% stenosed.  Ost Cx lesion is 70% stenosed.  Ost 1st Mrg lesion is 90% stenosed.  Prox Cx lesion is 60% stenosed.  Origin to Prox Graft lesion is 100% stenosed.  Previously placed Origin to Mid Graft stent (unknown type) is widely patent.   IMPRESSION: [name] has an occluded RCA vein graft with left-to-right collaterals which is a new finding since his last cath 4 years ago.  Proximal circumflex and obtuse marginal branch have progressed but unfortunately was not able to cross the obtuse marginal branch ostium.  This came out from the AV groove had a right angle and was high-grade and calcified.  At this point, I recommend aggressive antianginal medical therapy.  Angiomax was discontinued.  Sheath will be removed and pressure held.  Patient be hydrated and discharged home in the morning.  He left the lab in stable condition.  Right femoral approach   Echo 07/10/18: Study Conclusions - Procedure narrative: Transthoracic echocardiography. Image   quality was suboptimal. Intravenous contrast (Definity) was   administered. - Left ventricle: The cavity size was normal. Wall thickness was   normal. Systolic function was mildly reduced. The estimated  ejection fraction was in the range of 45% to 50%. Moderate   hypokinesis of the inferolateral and inferior myocardium;   consistent with ischemia or infarction in the distribution of the   right coronary or left circumflex coronary artery. Acoustic   contrast opacification revealed no evidence ofthrombus. - Ventricular septum: Septal motion showed paradox. These changes   are consistent with intraventricular conduction delay. - Mitral valve: Calcified annulus. There was moderate regurgitation   directed centrally. - Left atrium: The atrium was severely dilated. - Right atrium: The atrium was mildly dilated. - Pulmonary arteries: Systolic pressure was moderately increased.   PA peak pressure: 57 mm Hg (S).  Impressions: - Compared to 2017 there are new LV regional wall motion   abnormalities and LVEF has decreased.    History of Present Illness     84 y.o. male with past medical history of severe diverticulosis, multiple GI bleedssince 1981, carotid artery disease s/p R CEA by [name] '94, HTN, HLD, DM, CKD stage IV, CAD- s/p CABG 1996 with redo 2006, SVG-OM DES Aug 2015, NSTEMI Jan 2017 by Troponin and echo, and h/o PAF- not on coumadin due to recurrent GI bleeds. He presented 01/03/16 with recurrent GI bleed. He underwent Lt hemicolectomy 12/23/50 complicated by by peri-op NSTEMI (Troponin peak-16 on 01/16/16). Plan is for medical Rx.   Recently seen by Dr. Gwenlyn Found for progressive worsening dyspnea and chest pain.  Follow-up 2D echo showed new LV regional wall motion abnormality with reduced function to 45 to 50%.  PA pressure of 47 mmHg.  Left atrium severely dilated.  Stress test showed dense inferolateral scar with mild peri-infarct ischemia new since this previous Myoview performed in 2015.   Hospital Course     Consultants: none  CAD s/p CABG 1996 with redo 2006 Patient was scheduled for heart cath on 08/03/18. Heart cath revealed occluded RCA vein graft with left-to-right  collaterals, which is new since last heart cath 4 years ago. Proximal Cx and OM branch have progressed but unable to cross the OM branch ostium. Aggressive anti-anginal therapy is recommended. He tolerated the procedure well.   Pt is on 60 mg imdur and 500 mg ranexa BID, unable to tirtrate given renal function. Continue BB and added norvasc.   Paroxysmal atrial fibrillation Pt is rate controlled in the 80s, on beta blocker. This patients CHA2DS2-VASc Score and unadjusted Ischemic Stroke Rate (% per year) is equal to 9.7 % stroke rate/year from a score of 6 (age, CHF, HTN, MI, DM). Previously not on anticoagulation due to multiple GI bleeds. However, these bleeds werre due to diverticular disease and he is now s/p colectomy. Started eliquis 2.5 mg BID.    CDK stage III/IV Serum creatinine 2.15, which is at his baseline.    HTN Pressures have been controlled. Medications as above.   HLD 05/09/2018: Cholesterol 105; HDL 29.30; LDL Cholesterol 57; Triglycerides 94.0; VLDL 18.8 Pt was on lipitor while hospitalized. Will increase home crestor to 40 mg.   Pt seen and examined by Dr. Harrington Challenger and deemed acceptable and stable for discharge. Follow up will be arranged.   _____________  Discharge Vitals Blood pressure 131/61, pulse 73, temperature 98.1 F (36.7 C), temperature source Oral, resp. rate 19, height 5\' 7"  (1.702 m), weight 89.5 kg, SpO2 97 %.  Filed Weights   08/02/18 0818 08/03/18 0530 08/04/18 0605  Weight: 90.3 kg 90.8 kg 89.5 kg    Labs & Radiologic Studies    CBC Recent Labs    08/03/18 0501 08/04/18 0403  WBC 8.7 7.6  HGB 11.0* 10.5*  HCT 35.2* 34.4*  MCV 98.1 98.6  PLT 137* 315*   Basic Metabolic Panel Recent Labs    08/03/18 0501 08/04/18 0403  NA 141 140  K 4.3 3.9  CL 107 104  CO2 25 27  GLUCOSE 138* 133*  BUN 22 28*  CREATININE 1.84* 2.15*  CALCIUM 9.1 9.1   Liver Function Tests No results for input(s): AST, ALT, ALKPHOS, BILITOT, PROT, ALBUMIN in  the last 72 hours. No results for input(s): LIPASE, AMYLASE in the last 72 hours. Cardiac Enzymes No results for input(s): CKTOTAL, CKMB, CKMBINDEX, TROPONINI in the last 72 hours. BNP Invalid input(s): POCBNP D-Dimer No results for input(s): DDIMER in the last 72 hours. Hemoglobin A1C No results for input(s): HGBA1C in the last 72 hours. Fasting Lipid Panel No results for input(s): CHOL, HDL, LDLCALC, TRIG, CHOLHDL, LDLDIRECT in the last 72 hours. Thyroid Function Tests No results for input(s): TSH, T4TOTAL, T3FREE, THYROIDAB in the last 72 hours.  Invalid input(s): FREET3 _____________  No results found. Disposition   Pt is being discharged home today in good condition.  Follow-up Plans & Appointments    Follow-up Information    [name], MD. Go on 08/24/2018.   Specialties:  Cardiology, Radiology Why:  @9am  for post hospital follow up  Contact information: [address] [place] [address] [phone]          Discharge Instructions    AMB Referral to Cardiac Rehabilitation - Phase II   Complete by:  As directed    Diagnosis:  Coronary Stents   Diet - low sodium heart healthy   Complete by:  As directed    Discharge instructions   Complete by:  As directed    No driving for 2 days. No lifting over 5 lbs for 1 week. No sexual activity for 1 week. You may return to work in 1 week. Keep procedure site clean & dry. If you notice increased pain, swelling, bleeding or pus, call/return!  You may shower, but no soaking baths/hot tubs/pools for 1 week.   Increase activity slowly   Complete by:  As directed       Discharge Medications   Allergies as of 08/04/2018   No Known Allergies     Medication List    TAKE these medications   amLODipine 5 MG tablet Commonly known as:  NORVASC Take 1 tablet (5 mg total) by mouth daily. Start taking on:  08/05/2018   apixaban 2.5 MG Tabs tablet Commonly known as:  ELIQUIS Take 1 tablet (2.5 mg  total) by mouth 2 (two) times daily.   aspirin 81 MG tablet Take 81 mg by mouth daily.   BD INSULIN SYRINGE U/F 31G X 5/16" 0.3 ML Misc Generic drug:  Insulin Syringe-Needle U-100 USE DAILY AS INSTRUCTED   colchicine 0.6 MG tablet Take 1 tablet (0.6 mg total) by mouth daily as needed (gout flare up). What changed:  reasons to take this   fish oil-omega-3 fatty acids 1000 MG capsule Take 1 g by mouth daily.   furosemide 80 MG tablet Commonly known as:  LASIX TAKE 1 TABLET DAILY   glucose blood test strip USE TO TEST BLOOD SUGAR ONCE DAILY AS ANS NEEDED FOR DM (DX. E11.40)   insulin glargine 100 UNIT/ML injection Commonly known as:  LANTUS INJECT 20 TO 25 UNITS AT BEDTIME What changed:    how much to take  how to take this  when to take this  additional instructions   isosorbide mononitrate 60 MG 24 hr tablet Commonly known as:  IMDUR TAKE 1 TABLET TWICE A DAY WITH MEALS What changed:  when to take this   K-99 595 MG Caps Generic drug:  Potassium Gluconate Take 595 mg by mouth daily.   magnesium oxide 400 MG tablet Commonly known as:  MAG-OX Take 400 mg by mouth daily.   metoprolol tartrate 50 MG tablet Commonly known as:  LOPRESSOR Take 1 tablet (50 mg total) by mouth 2 (two) times daily.   multivitamin with minerals Tabs tablet Take 1 tablet by mouth daily.   nitroGLYCERIN 0.4 MG SL tablet Commonly known as:  NITROSTAT Place 1 tablet (0.4 mg total) under the tongue every 5 (five) minutes as needed for chest pain.   RANEXA 500 MG 12 hr tablet Generic drug:  ranolazine TAKE 1 TABLET TWICE A DAY What changed:  how much to take   rosuvastatin 40 MG tablet Commonly known as:  CRESTOR Take 1 tablet (40 mg total) by mouth at bedtime. What changed:    medication strength  how much to take  when to take this   traMADol 50 MG tablet Commonly known as:  ULTRAM Take 1-2 tablets (50-100 mg total) by mouth every 12 (twelve) hours as needed. What  changed:    how much to take  when to take this   Vitamin D 1000 units capsule Take 1,000 Units by mouth daily.        Acute coronary syndrome (MI, NSTEMI, STEMI, etc)  this admission?: No.    Outstanding Labs/Studies   CBC and BMP  Duration of Discharge Encounter   Greater than 30 minutes including physician time.  Signed, Ledora Bottcher, PA 08/04/2018, 9:23 AM  Patient seen and exmained   I agree with findings as notes above by A Aiyah Scarpelli    Pt doing well  No CP   NO SOB Lungs are rel CTA Cardiac RRR   No S3 Ext R groin without swelling or bruit   NO LE edema  1  CAD   Cath showed some progression with lesion in LCx and OM   Unfortunately unable to cross this  Plan for aggressive medical Rx as outpt Encouraged the pt to walk Watch fluids and salt intake to keep volume steady  2  PAF   SOme of symptoms may have been related to this  Currently on 2.5 bid Eliquis  FOllow as outpt     3  CKD

## 2018-08-04 NOTE — Progress Notes (Signed)
CARDIAC REHAB PHASE I   PRE:  Rate/Rhythm: 63 afib    BP: sitting 115/57    SaO2:   MODE:  Ambulation: 400 ft   POST:  Rate/Rhythm: 108 afib    BP: sitting 125/60     SaO2:   Pt with much less SOB today. Able to increase distance with less rest stops. Did admit to fatigue at end of walk and also legs being tired. Sts his breathing is not as deep as before walk but improved from yesterday. I used gait belt with light assist, pt stayed close to railing. He has a cane at home. Eager for d/c. HR controlled.  [place], ACSM 08/04/2018 8:40 AM

## 2018-08-24 ENCOUNTER — Ambulatory Visit (INDEPENDENT_AMBULATORY_CARE_PROVIDER_SITE_OTHER): Payer: Medicare Other | Admitting: Cardiovascular Disease

## 2018-08-24 ENCOUNTER — Encounter: Payer: Self-pay | Admitting: Cardiovascular Disease

## 2018-08-24 DIAGNOSIS — Z951 Presence of aortocoronary bypass graft: Secondary | ICD-10-CM

## 2018-08-24 DIAGNOSIS — I340 Nonrheumatic mitral (valve) insufficiency: Secondary | ICD-10-CM

## 2018-08-24 DIAGNOSIS — I5033 Acute on chronic diastolic (congestive) heart failure: Secondary | ICD-10-CM | POA: Diagnosis not present

## 2018-08-24 DIAGNOSIS — I6523 Occlusion and stenosis of bilateral carotid arteries: Secondary | ICD-10-CM | POA: Diagnosis not present

## 2018-08-24 MED ORDER — FUROSEMIDE 80 MG PO TABS: ORAL_TABLET | ORAL | 3 refills | Status: DC

## 2018-08-24 NOTE — Assessment & Plan Note (Signed)
She is a flutter/A. fib rate controlled on Eliquis oral anticoagulation.

## 2018-08-24 NOTE — Assessment & Plan Note (Signed)
History of moderate mitral regurgitation by recent 2D echocardiogram performed 07/10/2018 with an  EF of  45 to 50%.

## 2018-08-24 NOTE — Assessment & Plan Note (Signed)
Chronic combined systolic and diastolic heart failure on furosemide 80 mg a day with moderate renal insufficiency followed by Dr. Posey Pronto.  He does have 2+ pitting edema.  Will add an additional 40 mg of furosemide in the evenings and his lab work will be followed by his nephrologist.

## 2018-08-24 NOTE — Assessment & Plan Note (Signed)
History of CAD status post remote bypass surgery in 1996 with repeat in 2006 by [name].  He underwent cardiac catheterization by [name] in 2015 and underwent marginal vein graft intervention.  He was a Plavix nonresponder and was begun on Brilinta at that time.  Plavix was discontinued in the past because of GI bleed.  Because of progressive dyspnea, chest pain and an abnormal Myoview I performed cardiac catheterization on him 08/02/2018 via the right femoral approach revealing a newly occluded RCA vein graft with high-grade ostial first obtuse marginal branch stenosis which I was unable to access despite multiple techniques.  Since discharge he is denying chest pain.  He is on multiple anti-anginal medications.

## 2018-08-24 NOTE — Progress Notes (Signed)
08/24/2018 [name]   16-Jun-1932  [id number]  Primary Physician Tonia Ghent, MD Primary Cardiologist: [name] MD [name], [place], [place]  HPI:  [name] is a 84 y.o. engaged Caucasian male formerly a patient of [name]. I last saw him in the office 07/06/2018. [name] is the father of one daughter and one son , the grandfather to 2 grandchildren.He is accompanied by his fiancs [name] today.  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 His past medical history is remarkable for coronary artery bypass grafting in 1996 and again in 2006 by [name]. He had remote right carotid endarterectomy by [name] in 1994. History of hypertension, hyperlipidemia and diabetes. He was hospitalized in late July 2015 because of unstable angina. [name] performed cardiac catheterization revealing a high-grade obtuse marginal branch vein graft stenosis which was stented. Because of recurrent chest pain he was re-intervention relates later and found to have a new hazy lesion just proximal to the previously placed stent which was again intervened on. He was deemed a Plavix nonresponder and was begun on Brilenta. He also has a remote history of PAF as well as diverticulitis and has had GI bleed on Coumadin. His oral anticoagulation was discontinued.  He was recently admitted on 11/13/15 for a GI bleed. He underwent endoscopy revealing gastritis, colonoscopy revealing bleeding diverticula which was treated with clipping. He had 3 units of packed red blood cell transfusion. He did have chest pain and positive enzymes after his endoscopy within the echo that showed an EF of 50% with a new anterior wall motion and reality. He was thought to have "a non-STEMI. His Plavix was discontinued. Because of his moderate renal insufficiency and his inability to take antiplatelet therapy he did not undergo repeat cardiac catheterization. He  underwent left-sided hemicolectomy and sigmoidectomy because of chronic GI bleeding. This was uneventful. His hemoglobin has remained stable in the 12 range last checked in April. Because of his new onset chest pain and dyspnea performed a Myoview stress test that showed a new inferolateral scar/ischemic abnormality and echo that showed segmental wall motion abnormalities.  Because of this I performed diagnostic coronary arteriography on him 08/02/2018 revealing a newly occluded RCA vein graft with a high-grade first circumflex obtuse marginal branch ostial stenosis which I was unable to access because of geometry.  Since discharge he is denying chest pain.  He is on good antianginal medications..      Current Meds  Medication Sig  . amLODipine (NORVASC) 5 MG tablet Take 1 tablet (5 mg total) by mouth daily.  [name] apixaban (ELIQUIS) 2.5 MG TABS tablet Take 1 tablet (2.5 mg total) by mouth 2 (two) times daily.  [name] aspirin 81 MG tablet Take 81 mg by mouth daily.  . BD INSULIN SYRINGE ULTRAFINE 31G X 5/16" 0.3 ML MISC USE DAILY AS INSTRUCTED  . Cholecalciferol (VITAMIN D) 1000 UNITS capsule Take 1,000 Units by mouth daily.   . colchicine 0.6 MG tablet Take 1 tablet (0.6 mg total) by mouth daily as needed (gout flare up). (Patient taking differently: Take 0.6 mg by mouth daily as needed (Fri, Sat, Sun, Mon). )  . fish oil-omega-3 fatty acids 1000 MG capsule Take 1 g by mouth daily.   . furosemide (LASIX) 80 MG tablet Take one 80 mg tablet in the morning. Take HALF tablet 40mg  in the afternoon  . glucose blood (FREESTYLE LITE) test strip  USE TO TEST BLOOD SUGAR ONCE DAILY AS ANS NEEDED FOR DM (DX. E11.40)  . insulin glargine (LANTUS) 100 UNIT/ML injection INJECT 20 TO 25 UNITS AT BEDTIME (Patient taking differently: Inject 25 Units into the skin at bedtime. )  . isosorbide mononitrate (IMDUR) 60 MG 24 hr tablet TAKE 1 TABLET TWICE A DAY WITH MEALS  . magnesium oxide (MAG-OX) 400 MG tablet Take 400 mg by  mouth daily.   . Multiple Vitamin (MULTIVITAMIN WITH MINERALS) TABS Take 1 tablet by mouth daily.  . nitroGLYCERIN (NITROSTAT) 0.4 MG SL tablet Place 1 tablet (0.4 mg total) under the tongue every 5 (five) minutes as needed for chest pain.  [name] Potassium Gluconate (K-99) 595 MG CAPS Take 595 mg by mouth daily.   [name] RANEXA 500 MG 12 hr tablet TAKE 1 TABLET TWICE A DAY  . rosuvastatin (CRESTOR) 40 MG tablet Take 1 tablet (40 mg total) by mouth at bedtime.  . traMADol (ULTRAM) 50 MG tablet Take 1-2 tablets (50-100 mg total) by mouth every 12 (twelve) hours as needed. (Patient taking differently: Take 50 mg by mouth 2 (two) times daily. )  . [DISCONTINUED] furosemide (LASIX) 80 MG tablet TAKE 1 TABLET DAILY     No Known Allergies  Social History   Socioeconomic History  . Marital status: Widowed    Spouse name: Not on file  . Number of children: 2  . Years of education: Not on file  . Highest education level: Not on file  Occupational History  . 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 Financial resource strain: Not on file  . Food insecurity:    Worry: Not on file    Inability: Not on file  . Transportation needs:    Medical: Not on file    Non-medical: Not on file  Tobacco Use  . Smoking status: Former Smoker    Packs/day: 1.00    Years: 8.00    Pack years: 8.00    Types: Cigarettes  . Smokeless tobacco: Never Used  . Tobacco comment: "quit smoking cigarettes in 1958"  Substance and Sexual Activity  . Alcohol use: No    Alcohol/week: 0.0 standard drinks  . Drug use: No  . Sexual activity: Yes  Lifestyle  . Physical activity:    Days per week: Not on file    Minutes per session: Not on file  . Stress: Not on file  Relationships  . Social connections:    Talks on phone: Not on file    Gets together: Not on file    Attends religious service: Not on file    Active member of club or organization: Not on file    Attends meetings  of clubs or organizations: Not on file    Relationship status: Not on file  . Intimate partner violence:    Fear of current or ex partner: Not on file    Emotionally abused: Not on file    Physically abused: Not on file    Forced sexual activity: Not on file  Other Topics Concern  . Not on file  Social History Narrative   From [place].  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     Lives with girlfriend [name].  Widowed 12/2005.     Review of Systems: General: negative for chills, fever, night sweats or weight changes.  Cardiovascular: negative for chest pain, dyspnea on exertion, edema, orthopnea, palpitations, paroxysmal  nocturnal dyspnea or shortness of breath Dermatological: negative for rash Respiratory: negative for cough or wheezing Urologic: negative for hematuria Abdominal: negative for nausea, vomiting, diarrhea, bright red blood per rectum, melena, or hematemesis Neurologic: negative for visual changes, syncope, or dizziness All other systems reviewed and are otherwise negative except as noted above.    Blood pressure 126/66, pulse 67, height 5\' 7"  (1.702 m), weight 201 lb 3.2 oz (91.3 kg), SpO2 97 %.  General appearance: alert and no distress Neck: no adenopathy, no carotid bruit, no JVD, supple, symmetrical, trachea midline and thyroid not enlarged, symmetric, no tenderness/mass/nodules Lungs: clear to auscultation bilaterally Heart: regular rate and rhythm, S1, S2 normal, no murmur, click, rub or gallop Extremities: extremities normal, atraumatic, no cyanosis or edema Pulses: 2+ and symmetric Skin: Skin color, texture, turgor normal. No rashes or lesions Neurologic: Alert and oriented X 3, normal strength and tone. Normal symmetric reflexes. Normal coordination and gait  EKG not performed today  ASSESSMENT AND PLAN:   Atrial flutter ([place]) She is a flutter/A. fib rate controlled on Eliquis oral anticoagulation.  Hx of CABG History  of CAD status post remote bypass surgery in 1996 with repeat in 2006 by [name].  He underwent cardiac catheterization by [name] in 2015 and underwent marginal vein graft intervention.  He was a Plavix nonresponder and was begun on Brilinta at that time.  Plavix was discontinued in the past because of GI bleed.  Because of progressive dyspnea, chest pain and an abnormal Myoview I performed cardiac catheterization on him 08/02/2018 via the right femoral approach revealing a newly occluded RCA vein graft with high-grade ostial first obtuse marginal branch stenosis which I was unable to access despite multiple techniques.  Since discharge he is denying chest pain.  He is on multiple anti-anginal medications.  Acute on chronic diastolic CHF (congestive heart failure), NYHA class 3 ([name]) Chronic combined systolic and diastolic heart failure on furosemide 80 mg a day with moderate renal insufficiency followed by Dr. Posey Pronto.  He does have 2+ pitting edema.  Will add an additional 40 mg of furosemide in the evenings and his lab work will be followed by his nephrologist.  Mitral regurgitation History of moderate mitral regurgitation by recent 2D echocardiogram performed 07/10/2018 with an  EF of  45 to 50%.      [name] MD FACP,FACC,FAHA, [facility] 08/24/2018 9:42 AM

## 2018-08-24 NOTE — Patient Instructions (Signed)
Medication Instructions:  Your physician has recommended you make the following change in your medication:  1) Lasix: Take one tablet 80mg  in the morning and Take HALF tablet 40mg  tablet by mouth in the afternoon.   If you need a refill on your cardiac medications before your next appointment, please call your pharmacy.   Lab work: Your physician recommends that you return for lab work in: BMET - followed by nephrology   If you have labs (blood work) drawn today and your tests are completely normal, you will receive your results only by: [name] MyChart Message (if you have MyChart) OR . A paper copy in the mail If you have any lab test that is abnormal or we need to change your treatment, we will call you to review the results.  Testing/Procedures: none  Follow-Up: At [facility], you and your health needs are our priority.  As part of our continuing mission to provide you with exceptional heart care, we have created designated Provider Care Teams.  These Care Teams include your primary Cardiologist (physician) and Advanced Practice Providers (APPs -  Physician Assistants and Nurse Practitioners) who all work together to provide you with the care you need, when you need it. We request that you follow-up in: 6 months with an extender and in 12 months with Dr Gwenlyn Found.  Please call our office 2 months in advance to schedule this appointment.  You may see Quay Burow, MD or one of the following Advanced Practice Providers on your designated Care Team:   Kerin Ransom, PA-C Roby Lofts, [place] . Sande Rives, PA-C  Any Other Special Instructions Will Be Listed Below (If Applicable).

## 2018-08-24 NOTE — Addendum Note (Signed)
Addended by: Newt Minion on: 08/24/2018 09:55 AM   Modules accepted: Orders

## 2018-09-05 DIAGNOSIS — I872 Venous insufficiency (chronic) (peripheral): Secondary | ICD-10-CM | POA: Diagnosis not present

## 2018-09-05 DIAGNOSIS — Z1283 Encounter for screening for malignant neoplasm of skin: Secondary | ICD-10-CM | POA: Diagnosis not present

## 2018-09-05 DIAGNOSIS — X32XXXD Exposure to sunlight, subsequent encounter: Secondary | ICD-10-CM | POA: Diagnosis not present

## 2018-09-05 DIAGNOSIS — L570 Actinic keratosis: Secondary | ICD-10-CM | POA: Diagnosis not present

## 2018-09-05 DIAGNOSIS — E889 Metabolic disorder, unspecified: Secondary | ICD-10-CM | POA: Diagnosis not present

## 2018-09-05 DIAGNOSIS — Z85820 Personal history of malignant melanoma of skin: Secondary | ICD-10-CM | POA: Diagnosis not present

## 2018-09-05 DIAGNOSIS — E559 Vitamin D deficiency, unspecified: Secondary | ICD-10-CM | POA: Diagnosis not present

## 2018-09-05 DIAGNOSIS — N183 Chronic kidney disease, stage 3 (moderate): Secondary | ICD-10-CM | POA: Diagnosis not present

## 2018-09-05 DIAGNOSIS — Z08 Encounter for follow-up examination after completed treatment for malignant neoplasm: Secondary | ICD-10-CM | POA: Diagnosis not present

## 2018-09-05 DIAGNOSIS — C44622 Squamous cell carcinoma of skin of right upper limb, including shoulder: Secondary | ICD-10-CM | POA: Diagnosis not present

## 2018-09-10 DIAGNOSIS — M9080 Osteopathy in diseases classified elsewhere, unspecified site: Secondary | ICD-10-CM | POA: Diagnosis not present

## 2018-09-10 DIAGNOSIS — D631 Anemia in chronic kidney disease: Secondary | ICD-10-CM | POA: Diagnosis not present

## 2018-09-10 DIAGNOSIS — E889 Metabolic disorder, unspecified: Secondary | ICD-10-CM | POA: Diagnosis not present

## 2018-09-10 DIAGNOSIS — N183 Chronic kidney disease, stage 3 (moderate): Secondary | ICD-10-CM | POA: Diagnosis not present

## 2018-09-10 DIAGNOSIS — I129 Hypertensive chronic kidney disease with stage 1 through stage 4 chronic kidney disease, or unspecified chronic kidney disease: Secondary | ICD-10-CM | POA: Diagnosis not present

## 2018-09-20 ENCOUNTER — Encounter: Payer: Self-pay | Admitting: Family Medicine

## 2018-09-20 LAB — LAB REPORT - SCANNED: CREATININE: 2.46

## 2018-09-20 NOTE — Addendum Note (Signed)
Addended by: Josetta Huddle on: 09/20/2018 05:56 PM   Modules accepted: Orders

## 2018-11-27 ENCOUNTER — Other Ambulatory Visit: Payer: Self-pay | Admitting: Cardiovascular Disease

## 2018-11-28 ENCOUNTER — Encounter (INDEPENDENT_AMBULATORY_CARE_PROVIDER_SITE_OTHER): Payer: Medicare Other | Admitting: Ophthalmology

## 2018-12-20 ENCOUNTER — Ambulatory Visit (HOSPITAL_COMMUNITY)
Admission: RE | Admit: 2018-12-20 | Discharge: 2018-12-20 | Disposition: A | Discharge status: Home / Self Care | Payer: Medicare Other | Source: Ambulatory Visit | Attending: Internal Medicine | Admitting: Internal Medicine

## 2018-12-20 DIAGNOSIS — I6523 Occlusion and stenosis of bilateral carotid arteries: Secondary | ICD-10-CM | POA: Diagnosis not present

## 2018-12-21 ENCOUNTER — Other Ambulatory Visit: Payer: Self-pay | Admitting: *Deleted

## 2018-12-21 DIAGNOSIS — I6523 Occlusion and stenosis of bilateral carotid arteries: Secondary | ICD-10-CM

## 2018-12-21 NOTE — Progress Notes (Signed)
vas 

## 2018-12-24 ENCOUNTER — Other Ambulatory Visit: Payer: Self-pay | Admitting: *Deleted

## 2018-12-24 DIAGNOSIS — I6523 Occlusion and stenosis of bilateral carotid arteries: Secondary | ICD-10-CM

## 2019-01-31 ENCOUNTER — Telehealth: Payer: Self-pay | Admitting: Family Medicine

## 2019-01-31 NOTE — Telephone Encounter (Signed)
Patient says a message was left about 6 months ago on his answering machine and after that, he went into the hospital and he just has not followed up on this notice.  It involves labs done in September concerning his TSH. The message read that TSH and A1c would be checked at his next OV with Dr. Damita Dunnings but patient has not been back in for an OV.  Does he need to wait longer until this Covid-19 blows over or should he get labs now?

## 2019-01-31 NOTE — Telephone Encounter (Signed)
Pt want call back from nurse concerning why he was called 6 months ago concerning a problem.

## 2019-02-01 ENCOUNTER — Other Ambulatory Visit (INDEPENDENT_AMBULATORY_CARE_PROVIDER_SITE_OTHER): Payer: Medicare Other

## 2019-02-01 ENCOUNTER — Other Ambulatory Visit: Payer: Self-pay

## 2019-02-01 DIAGNOSIS — E1142 Type 2 diabetes mellitus with diabetic polyneuropathy: Secondary | ICD-10-CM

## 2019-02-01 LAB — TSH: TSH: 3.89 u[IU]/mL (ref 0.35–4.50)

## 2019-02-01 LAB — HEMOGLOBIN A1C: Hgb A1c MFr Bld: 8.2 % — ABNORMAL HIGH (ref 4.6–6.5)

## 2019-02-01 NOTE — Telephone Encounter (Signed)
Given the timeline, I would try to get the labs done today if possible.  No OV, just come in for lab visit mid morning and then go home.  Thanks.

## 2019-02-01 NOTE — Telephone Encounter (Signed)
Patient advised.  Lab appt scheduled.  

## 2019-02-26 ENCOUNTER — Other Ambulatory Visit: Payer: Self-pay | Admitting: Cardiovascular Disease

## 2019-02-26 NOTE — Telephone Encounter (Signed)
Lopressor 50 mg refilled. 

## 2019-03-18 ENCOUNTER — Telehealth: Payer: Self-pay

## 2019-03-18 NOTE — Telephone Encounter (Signed)
Virtual Visit Pre-Appointment Phone Call  "(Name, I am calling you today to discuss your upcoming appointment. We are currently trying to limit exposure to the virus that causes COVID-19 by seeing patients at home rather than in the office."  1. "What is the BEST phone number to call the day of the visit?" - include this in appointment notes  2. "Do you have or have access to (through a family member/friend) a smartphone with video capability that we can use for your visit?" a. If yes - list this number in appt notes as "cell" (if different from BEST phone #) and list the appointment type as a VIDEO visit in appointment notes b. If no - list the appointment type as a PHONE visit in appointment notes  3. Confirm consent - "In the setting of the current Covid19 crisis, you are scheduled for a PHONE visit with your provider on 03/20/2019 at 10:30AM.  Just as we do with many in-office visits, in order for you to participate in this visit, we must obtain consent.  If you'd like, I can send this to your mychart (if signed up) or email for you to review.  Otherwise, I can obtain your verbal consent now.  All virtual visits are billed to your insurance company just like a normal visit would be.  By agreeing to a virtual visit, we'd like you to understand that the technology does not allow for your provider to perform an examination, and thus may limit your provider's ability to fully assess your condition. If your provider identifies any concerns that need to be evaluated in person, we will make arrangements to do so.  Finally, though the technology is pretty good, we cannot assure that it will always work on either your or our end, and in the setting of a video visit, we may have to convert it to a phone-only visit.  In either situation, we cannot ensure that we have a secure connection.  Are you willing to proceed?" STAFF: Did the patient verbally acknowledge consent to telehealth visit? Document YES/NO  here: YES  4. Advise patient to be prepared - "Two hours prior to your appointment, go ahead and check your blood pressure, pulse, oxygen saturation, and your weight (if you have the equipment to check those) and write them all down. When your visit starts, your provider will ask you for this information. If you have an Apple Watch or Kardia device, please plan to have heart rate information ready on the day of your appointment. Please have a pen and paper handy nearby the day of the visit as well."  5. Give patient instructions for MyChart download to smartphone OR Doximity/Doxy.me as below if video visit (depending on what platform provider is using)  6. Inform patient they will receive a phone call 15 minutes prior to their appointment time (may be from unknown caller ID) so they should be prepared to answer    TELEPHONE CALL NOTE  [name] has been deemed a candidate for a follow-up tele-health visit to limit community exposure during the Covid-19 pandemic. I spoke with the patient via phone to ensure availability of phone/video source, confirm preferred email & phone number, and discuss instructions and expectations.  I reminded [name] to be prepared with any vital sign and/or heart rhythm information that could potentially be obtained via home monitoring, at the time of his visit. I reminded [name] to expect a phone call prior to his visit.  [name], [place] 03/18/2019 5:01 PM   INSTRUCTIONS FOR DOWNLOADING THE MYCHART APP TO SMARTPHONE  - The patient must first make sure to have activated MyChart and know their login information - If Apple, go to [employer] and type in MyChart in the search bar and download the app. If Android, ask patient to go to [employer] and type in [place] in the search bar and download the app. The app is free but as with any other app downloads, their phone may require them to verify saved payment information or Apple/Android  password.  - The patient will need to then log into the app with their MyChart username and password, and select [facility] as their healthcare provider to link the account. When it is time for your visit, go to the MyChart app, find appointments, and click Begin Video Visit. Be sure to Select Allow for your device to access the Microphone and Camera for your visit. You will then be connected, and your provider will be with you shortly.  **If they have any issues connecting, or need assistance please contact MyChart service desk [phone])**  **If using a computer, in order to ensure the best quality for their visit they will need to use either of the following Internet Browsers: [employer], or Google Chrome**  IF USING DOXIMITY or DOXY.ME - The patient will receive a link just prior to their visit by text.     FULL LENGTH CONSENT FOR TELE-HEALTH VISIT   I hereby voluntarily request, consent and authorize [facility] and its employed or contracted physicians, physician assistants, nurse practitioners or other licensed health care professionals (the Practitioner), to provide me with telemedicine health care services (the "Services") as deemed necessary by the treating Practitioner. I acknowledge and consent to receive the Services by the Practitioner via telemedicine. I understand that the telemedicine visit will involve communicating with the Practitioner through live audiovisual communication technology and the disclosure of certain medical information by electronic transmission. I acknowledge that I have been given the opportunity to request an in-person assessment or other available alternative prior to the telemedicine visit and am voluntarily participating in the telemedicine visit.  I understand that I have the right to withhold or withdraw my consent to the use of telemedicine in the course of my care at any time, without affecting my right to future care or treatment,  and that the Practitioner or I may terminate the telemedicine visit at any time. I understand that I have the right to inspect all information obtained and/or recorded in the course of the telemedicine visit and may receive copies of available information for a reasonable fee.  I understand that some of the potential risks of receiving the Services via telemedicine include:  [name] Delay or interruption in medical evaluation due to technological equipment failure or disruption; . Information transmitted may not be sufficient (e.g. poor resolution of images) to allow for appropriate medical decision making by the Practitioner; and/or  . In rare instances, security protocols could fail, causing a breach of personal health information.  Furthermore, I acknowledge that it is my responsibility to provide information about my medical history, conditions and care that is complete and accurate to the best of my ability. I acknowledge that Practitioner's advice, recommendations, and/or decision may be based on factors not within their control, such as incomplete or inaccurate data provided by me or distortions of diagnostic images or specimens that may result from electronic transmissions. I understand that  the practice of medicine is not an exact science and that Practitioner makes no warranties or guarantees regarding treatment outcomes. I acknowledge that I will receive a copy of this consent concurrently upon execution via email to the email address I last provided but may also request a printed copy by calling the office of [facility].    I understand that my insurance will be billed for this visit.   I have read or had this consent read to me. . I understand the contents of this consent, which adequately explains the benefits and risks of the Services being provided via telemedicine.  . I have been provided ample opportunity to ask questions regarding this consent and the Services and have had my questions  answered to my satisfaction. . I give my informed consent for the services to be provided through the use of telemedicine in my medical care  By participating in this telemedicine visit I agree to the above.

## 2019-03-20 ENCOUNTER — Telehealth (INDEPENDENT_AMBULATORY_CARE_PROVIDER_SITE_OTHER): Payer: Medicare Other | Admitting: Cardiology

## 2019-03-20 ENCOUNTER — Encounter: Payer: Self-pay | Admitting: Cardiology

## 2019-03-20 ENCOUNTER — Telehealth: Payer: Self-pay

## 2019-03-20 VITALS — BP 116/68 | HR 67 | Ht 67.0 in | Wt 195.0 lb

## 2019-03-20 DIAGNOSIS — IMO0001 Reserved for inherently not codable concepts without codable children: Secondary | ICD-10-CM

## 2019-03-20 DIAGNOSIS — Z951 Presence of aortocoronary bypass graft: Secondary | ICD-10-CM | POA: Diagnosis not present

## 2019-03-20 DIAGNOSIS — I480 Paroxysmal atrial fibrillation: Secondary | ICD-10-CM

## 2019-03-20 DIAGNOSIS — R0609 Other forms of dyspnea: Secondary | ICD-10-CM

## 2019-03-20 DIAGNOSIS — N184 Chronic kidney disease, stage 4 (severe): Secondary | ICD-10-CM

## 2019-03-20 DIAGNOSIS — I25118 Atherosclerotic heart disease of native coronary artery with other forms of angina pectoris: Secondary | ICD-10-CM

## 2019-03-20 DIAGNOSIS — Z794 Long term (current) use of insulin: Secondary | ICD-10-CM

## 2019-03-20 DIAGNOSIS — R0600 Dyspnea, unspecified: Secondary | ICD-10-CM | POA: Insufficient documentation

## 2019-03-20 DIAGNOSIS — Z8719 Personal history of other diseases of the digestive system: Secondary | ICD-10-CM

## 2019-03-20 DIAGNOSIS — E118 Type 2 diabetes mellitus with unspecified complications: Secondary | ICD-10-CM

## 2019-03-20 DIAGNOSIS — Z7901 Long term (current) use of anticoagulants: Secondary | ICD-10-CM | POA: Insufficient documentation

## 2019-03-20 NOTE — Telephone Encounter (Signed)
Contacted patient to discuss AVS instructions. Patient agreeable with Luke recommendations and voiced understanding. 

## 2019-03-20 NOTE — Patient Instructions (Addendum)
Medication Instructions:  Your physician recommends that you continue on your current medications as directed. Please refer to the Current Medication list given to you today.  If you need a refill on your cardiac medications before your next appointment, please call your pharmacy.   Lab work: Your physician recommends that you return for lab work in: [place] If you have labs (blood work) drawn today and your tests are completely normal, you will receive your results only by: [name] MyChart Message (if you have MyChart) OR . A paper copy in the mail If you have any lab test that is abnormal or we need to change your treatment, we will call you to review the results.  Testing/Procedures: None   Follow-Up: At [facility], you and your health needs are our priority.  As part of our continuing mission to provide you with exceptional heart care, we have created designated Provider Care Teams.  These Care Teams include your primary Cardiologist (physician) and Advanced Practice Providers (APPs -  Physician Assistants and Nurse Practitioners) who all work together to provide you with the care you need, when you need it. At [facility], you and your health needs are our priority.  As part of our continuing mission to provide you with exceptional heart care, we have created designated Provider Care Teams.  These Care Teams include your primary Cardiologist (physician) and Advanced Practice Providers (APPs -  Physician Assistants and Nurse Practitioners) who all work together to provide you with the care you need, when you need it. You will need a follow up appointment in 3 months. You may see Quay Burow, MD or one of the following Advanced Practice Providers on your designated Care Team:   Kerin Ransom, PA-C Roby Lofts, [place] . Sande Rives, PA-C  Any Other Special Instructions Will Be Listed Below (If Applicable).

## 2019-03-20 NOTE — Progress Notes (Signed)
Virtual Visit via Telephone Note   This visit type was conducted due to national recommendations for restrictions regarding the COVID-19 Pandemic (e.g. social distancing) in an effort to limit this patient's exposure and mitigate transmission in our community.  Due to his co-morbid illnesses, this patient is at least at moderate risk for complications without adequate follow up.  This format is felt to be most appropriate for this patient at this time.  The patient did not have access to video technology/had technical difficulties with video requiring transitioning to audio format only (telephone).  All issues noted in this document were discussed and addressed.  No physical exam could be performed with this format.  Please refer to the patient's chart for his  consent to telehealth for [facility].   Date:  03/20/2019   ID:  [name], DOB [birth year], MRN [id number]  Patient Location: Home Provider Location: Home  PCP:  Tonia Ghent, MD  Cardiologist:  Quay Burow, MD  Electrophysiologist:  None   Evaluation Performed:  Follow-Up Visit  Chief Complaint:  Exertional dyspnea  History of Present Illness:    [name] is a 85 y.o. male with a past medical history of severe diverticulosis, multiple GI bleeds since 1981, carotid artery disease s/p R CEA by [name] '94, HTN, HLD, IDDM, CKD stage IV, CAD- s/p CABG 1996 with redo 2006, SVG-OM DES Aug 2015, NSTEMI Jan 2017 by Troponin and echo, and h/o PAF- now appears to be persistent AF.  He presented 01/03/16 with recurrent GI bleed. He underwent Lt hemicolectomy 03/16/60 complicated by by peri-op NSTEMI (Troponin peak-16 on 01/16/16). Plan is for medical Rx.  He was ultimately put back on anticoagulation and has tolerated this.   In Sept 2019 he was admitted with chest pain and an abnormal Myoview.  Cath revealed an occluded RCA vein graft with left-to-right collaterals,  new since last heart cath 4 years ago. His proximal Cx and  OM branch had progressed.  Dr Gwenlyn Found was unable to cross the OM branch ostium. Aggressive anti-anginal therapy was recommended.   He was contacted today as a follow up.  He denies chest pain but did say he has noticed increased DOE, especially walking uphill.  This is new since his LOV with Dr Gwenlyn Found in Oct.   The patient does not have symptoms concerning for COVID-19 infection (fever, chills, cough, or new shortness of breath).    Past Medical History:  Diagnosis Date  . Age-related macular degeneration, wet, both eyes ([place])   . Anemia    Secondary to acute blood loss  . Anginal pain ([place])   . Arthritis    "mild in hands, knees, ankles" (11/13/2015)  . Atrial fibrillation ([place])    Consideration was given for atrial flutter ablation, but patient developed atrial fibrillation. Cardioversion was done. Dr. Caryl Comes decided to watch him clinically. November, 2011  . Atrial flutter [facility]) 07/2010   September, 2011   Hospital with PNA and cath done.Marland KitchenMarland KitchenCoumadin.  Atrial flutter ablation planned, but  pt. then had atrial fibrillation,/outpatient conversion 09/08/10..NSR..plan to follow..Dr. Caryl Comes  . CAD (coronary artery disease)    Catheterization, September, 2011,  grafts patent from redo CABG,, medical therapy of coronary disease, consideration to proceeding with atrial flutter ablation  . Carotid artery disease ([place])    Doppler 09/18/2009 - 49% bilateral stenoses  . Carotid artery disease ([name])    49% bilateral, Doppler, November, 2010  . Chronic kidney disease (CKD), stage III (moderate) ([name])   .  Diabetic peripheral neuropathy ([place])   . Diverticulosis of colon with hemorrhage 2009   several unit diverticular bleed   . Ejection fraction    EF 60%, echo, 2009  //   EF 65%, echo, September, 2011  . Erectile dysfunction    Mild  . Gout   . History of blood transfusion "several"   related to diverticular bleeding  . Hyperlipidemia   . Hypertension   . Mitral regurgitation    Mild, echo,  September, 2011  . NSTEMI (non-ST elevated myocardial infarction) ([place]) 07/2010   at [facility] with repeat cath, rec medical mgmt   . OSA on CPAP   . Personal history of colonic polyps   . PNA (pneumonia) 9/11   NSTEMI at [facility] with repeat cath, rec medical mgmt   . RBBB (right bundle branch block)   . Shoulder pain    "positional; better now" (11/13/2015)  . Skin cancer    R lower leg, per derm 2012  . Type II diabetes mellitus ([place])    Past Surgical History:  Procedure Laterality Date  . CARDIAC CATHETERIZATION  2006   Nuclear..slight lateral ischemia..medical therapy  . CARDIAC CATHETERIZATION  08/04/2010   grafts patent from redo CABG...medical Rx and ablate Atrial flutter (LV not injected)   . CARDIOVERSION  ~ 2010  . CAROTID ENDARTERECTOMY Right 1994  . CATARACT EXTRACTION W/ INTRAOCULAR LENS  IMPLANT, BILATERAL Bilateral   . COLON RESECTION N/A 01/13/2016   Procedure: EXPLORATORY LAPAROTOMY, LEFT AND SIGMOID COLON REMOVAL;  Surgeon: Ralene Ok, MD;  Location: [facility];  Service: General;  Laterality: N/A;  Extended open left hemicolectomy and sigmoidectomy   . COLONOSCOPY N/A 11/14/2015   Procedure: COLONOSCOPY;  Surgeon: Manus Gunning, MD;  Location: [facility] ENDOSCOPY;  Service: Gastroenterology;  Laterality: N/A;  . COLONOSCOPY Left 01/05/2016   Procedure: COLONOSCOPY;  Surgeon: Manus Gunning, MD;  Location: [facility];  Service: Gastroenterology;  Laterality: Left;  no sedation to start, moderate if needed  . COLONOSCOPY W/ POLYPECTOMY    . CORONARY ARTERY BYPASS GRAFT  1995; 2006   "X 3; X3"  . [place], 2006  . DOPPLER ECHOCARDIOGRAPHY  08/2008   EF 60%  . DOPPLER ECHOCARDIOGRAPHY  08/02/2010   65-70%  . DOPPLER ECHOCARDIOGRAPHY  07/2010   MR mild  . ESOPHAGOGASTRODUODENOSCOPY N/A 06/19/2014   Procedure: ESOPHAGOGASTRODUODENOSCOPY (EGD);  Surgeon: Jerene Bears, MD;  [facility] END[facility]COPY;  Service: Endoscopy;  Laterality: N/A;  .  ESOPHAGOGASTRODUODENOSCOPY N/A 11/14/2015   Procedure: ESOPHAGOGASTRODUODENOSCOPY (EGD);  Surgeon: Manus Gunning, MD;  Location: [facility];  Service: Gastroenterology;  Laterality: N/A;  . FLEXIBLE SIGMOIDOSCOPY N/A 11/16/2015   Procedure: FLEXIBLE SIGMOIDOSCOPY;  Surgeon: Manus Gunning, MD;  Location: [facility];  Service: Gastroenterology;  Laterality: N/A;  . LAPAROSCOPIC CHOLECYSTECTOMY  2008  . LEFT HEART CATH N/A 06/14/2014   Procedure: LEFT HEART CATH;  Surgeon: Sinclair Grooms, MD;  Location: [facility] CATH LAB;  Service: Cardiovascular;  Laterality: N/A;  . LEFT HEART CATH AND CORONARY ANGIOGRAPHY N/A 08/02/2018   Procedure: LEFT HEART CATH AND CORONARY ANGIOGRAPHY;  Surgeon: [name], MD;  Location: [facility] CV LAB;  Service: Cardiovascular;  Laterality: N/A;  . LEFT HEART CATHETERIZATION WITH CORONARY/GRAFT ANGIOGRAM N/A 06/11/2014   Procedure: LEFT HEART CATHETERIZATION WITH Beatrix Fetters;  Surgeon: Sinclair Grooms, MD;  Location: [facility] C[facility] LAB;  Service: Cardiovascular;  Laterality: N/A;  . PERCUTANEOUS CORONARY STENT INTERVENTION (PCI-S) N/A 06/13/2014  Procedure: PERCUTANEOUS CORONARY STENT INTERVENTION (PCI-S);  Surgeon: Sinclair Grooms, MD;  Location: [facility] CATH LAB;  Service: Cardiovascular;  Laterality: N/A;  . SKIN CANCER EXCISION Left 10/2015   calf  . SKIN CANCER EXCISION Right 2014?   chest  . TONSILLECTOMY    . VASECTOMY       Current Meds  Medication Sig  . apixaban (ELIQUIS) 2.5 MG TABS tablet Take 1 tablet (2.5 mg total) by mouth 2 (two) times daily.  [name] aspirin 81 MG tablet Take 81 mg by mouth daily.  . BD INSULIN SYRINGE ULTRAFINE 31G X 5/16" 0.3 ML MISC USE DAILY AS INSTRUCTED  . Cholecalciferol (VITAMIN D) 1000 UNITS capsule Take 1,000 Units by mouth 2 (two) times a day.   . colchicine 0.6 MG tablet Take 1 tablet (0.6 mg total) by mouth daily as needed (gout flare up). (Patient taking differently: Take 0.6 mg by mouth daily as  needed (Fri, Sat, Sun, Mon). )  . fish oil-omega-3 fatty acids 1000 MG capsule Take 1 g by mouth daily.   . furosemide (LASIX) 80 MG tablet Take one 80 mg tablet in the morning. Take HALF tablet 40mg  in the afternoon  . glucose blood (FREESTYLE LITE) test strip USE TO TEST BLOOD SUGAR ONCE DAILY AS ANS NEEDED FOR DM (DX. E11.40)  . insulin glargine (LANTUS) 100 UNIT/ML injection INJECT 20 TO 25 UNITS AT BEDTIME (Patient taking differently: Inject 25 Units into the skin at bedtime. )  . isosorbide mononitrate (IMDUR) 60 MG 24 hr tablet TAKE 1 TABLET TWICE A DAY WITH MEALS  . magnesium oxide (MAG-OX) 400 MG tablet Take 400 mg by mouth daily.   . metoprolol tartrate (LOPRESSOR) 50 MG tablet TAKE 1 TABLET TWICE A DAY  . Multiple Vitamin (MULTIVITAMIN WITH MINERALS) TABS Take 1 tablet by mouth daily.  . nitroGLYCERIN (NITROSTAT) 0.4 MG SL tablet Place 1 tablet (0.4 mg total) under the tongue every 5 (five) minutes as needed for chest pain.  [name] Potassium Gluconate (K-99) 595 MG CAPS Take 595 mg by mouth daily.   . ranolazine (RANEXA) 500 MG 12 hr tablet TAKE 1 TABLET TWICE A DAY  . rosuvastatin (CRESTOR) 40 MG tablet Take 1 tablet (40 mg total) by mouth at bedtime.     Allergies:   Patient has no known allergies.   Social History   Tobacco Use  . Smoking status: Former Smoker    Packs/day: 1.00    Years: 8.00    Pack years: 8.00    Types: Cigarettes  . Smokeless tobacco: Never Used  . Tobacco comment: "quit smoking cigarettes in 1958"  Substance Use Topics  . Alcohol use: No    Alcohol/week: 0.0 standard drinks  . Drug use: No     Family Hx: The patient's family history includes Arthritis in his sister; Cancer in his sister; Diabetes in his mother and sister; Heart disease in his father and sister; Kidney disease in his mother; Stroke in his mother. There is no history of Prostate cancer or Colon cancer.  ROS:   Please see the history of present illness.    All other systems reviewed  and are negative.   Prior CV studies:   The following studies were reviewed today:  Cath/ Sept 2019  Labs/Other Tests and Data Reviewed:    EKG:  An ECG dated 08/03/2018 was personally reviewed today and demonstrated:  AF with VR 85, RBBB, LAFB  Recent Labs: 08/04/2018: BUN 28; Hemoglobin 10.5; Platelets 118; Potassium  3.9; Sodium 140 09/05/2018: Creatinine, Ser 2.46 02/01/2019: TSH 3.89   Recent Lipid Panel Lab Results  Component Value Date/Time   CHOL 105 05/09/2018 11:24 AM   TRIG 94.0 05/09/2018 11:24 AM   HDL 29.30 (L) 05/09/2018 11:24 AM   CHOLHDL 4 05/09/2018 11:24 AM   LDLCALC 57 05/09/2018 11:24 AM    Wt Readings from Last 3 Encounters:  03/20/19 195 lb (88.5 kg)  08/24/18 201 lb 3.2 oz (91.3 kg)  08/04/18 197 lb 6.4 oz (89.5 kg)     Objective:    Vital Signs:  BP 116/68   Pulse 67   Ht 5\' 7"  (1.702 m)   Wt 195 lb (88.5 kg)   BMI 30.54 kg/m    VITAL SIGNS:  reviewed  ASSESSMENT & PLAN:    1.  DOE- I suggested we check labs- r/o anemia before increasing his anti-anginal Rx  2.  Paroxysmal atrial fibrillation -He is back in atrial fibrillation.  Rate control at 80s.  Continue beta-blocker. CHADSVASC score of 6.  Anticoagulation resumed.  3.  Chronic kidney disease stage III/IV -Dr Posey Pronto follows  4.  Hypertension -Blood pressure stable on current medication  5. HLD = 05/09/2018: Cholesterol 105; HDL 29.30; LDL Cholesterol 57; Triglycerides 94.0; VLDL 18.8  -Continue Lipitor.  COVID-19 Education: The signs and symptoms of COVID-19 were discussed with the patient and how to seek care for testing (follow up with PCP or arrange E-visit).  The importance of social distancing was discussed today.  Time:   Today, I have spent 20 minutes with the patient with telehealth technology discussing the above problems.     Medication Adjustments/Labs and Tests Ordered: Current medicines are reviewed at length with the patient today.  Concerns regarding  medicines are outlined above.   Tests Ordered: No orders of the defined types were placed in this encounter.   Medication Changes: No orders of the defined types were placed in this encounter.   Disposition:  Follow up Check CBC and BMP.  If his Hgb is not too low (>9.0)  we could try increasing his Imdur to 90 mg.  I would not increase his Ranexa with his CRI.   Angelena Form, PA-C  03/20/2019 10:48 AM    [facility]

## 2019-03-21 DIAGNOSIS — Z951 Presence of aortocoronary bypass graft: Secondary | ICD-10-CM | POA: Diagnosis not present

## 2019-03-21 DIAGNOSIS — Z7901 Long term (current) use of anticoagulants: Secondary | ICD-10-CM | POA: Diagnosis not present

## 2019-03-21 DIAGNOSIS — R0609 Other forms of dyspnea: Secondary | ICD-10-CM | POA: Diagnosis not present

## 2019-03-21 DIAGNOSIS — N184 Chronic kidney disease, stage 4 (severe): Secondary | ICD-10-CM | POA: Diagnosis not present

## 2019-03-21 DIAGNOSIS — I480 Paroxysmal atrial fibrillation: Secondary | ICD-10-CM | POA: Diagnosis not present

## 2019-03-21 DIAGNOSIS — Z8719 Personal history of other diseases of the digestive system: Secondary | ICD-10-CM | POA: Diagnosis not present

## 2019-03-21 DIAGNOSIS — E118 Type 2 diabetes mellitus with unspecified complications: Secondary | ICD-10-CM | POA: Diagnosis not present

## 2019-03-21 DIAGNOSIS — Z794 Long term (current) use of insulin: Secondary | ICD-10-CM | POA: Diagnosis not present

## 2019-03-21 DIAGNOSIS — I25118 Atherosclerotic heart disease of native coronary artery with other forms of angina pectoris: Secondary | ICD-10-CM | POA: Diagnosis not present

## 2019-03-22 LAB — CBC
Hematocrit: 36.4 % — ABNORMAL LOW (ref 37.5–51.0)
Hemoglobin: 11.6 g/dL — ABNORMAL LOW (ref 13.0–17.7)
MCH: 30.5 pg (ref 26.6–33.0)
MCHC: 31.9 g/dL (ref 31.5–35.7)
MCV: 96 fL (ref 79–97)
Platelets: 137 10*3/uL — ABNORMAL LOW (ref 150–450)
RBC: 3.8 x10E6/uL — ABNORMAL LOW (ref 4.14–5.80)
RDW: 14.2 % (ref 11.6–15.4)
WBC: 6.6 10*3/uL (ref 3.4–10.8)

## 2019-03-22 LAB — BASIC METABOLIC PANEL
BUN/Creatinine Ratio: 16 (ref 10–24)
BUN: 32 mg/dL — ABNORMAL HIGH (ref 8–27)
CO2: 27 mmol/L (ref 20–29)
Calcium: 10.1 mg/dL (ref 8.6–10.2)
Chloride: 100 mmol/L (ref 96–106)
Creatinine, Ser: 2.01 mg/dL — ABNORMAL HIGH (ref 0.76–1.27)
GFR calc Af Amer: 34 mL/min/{1.73_m2} — ABNORMAL LOW (ref >59)
GFR calc non Af Amer: 29 mL/min/{1.73_m2} — ABNORMAL LOW (ref >59)
Glucose: 327 mg/dL — ABNORMAL HIGH (ref 65–99)
Potassium: 4.2 mmol/L (ref 3.5–5.2)
Sodium: 141 mmol/L (ref 134–144)

## 2019-03-26 ENCOUNTER — Other Ambulatory Visit: Payer: Self-pay

## 2019-03-26 MED ORDER — ISOSORBIDE MONONITRATE ER 60 MG PO TB24: ORAL_TABLET | ORAL | 1 refills | Status: DC

## 2019-03-26 NOTE — Progress Notes (Signed)
Medication changes

## 2019-04-05 DIAGNOSIS — Z85820 Personal history of malignant melanoma of skin: Secondary | ICD-10-CM | POA: Diagnosis not present

## 2019-04-05 DIAGNOSIS — D045 Carcinoma in situ of skin of trunk: Secondary | ICD-10-CM | POA: Diagnosis not present

## 2019-04-05 DIAGNOSIS — Z85828 Personal history of other malignant neoplasm of skin: Secondary | ICD-10-CM | POA: Diagnosis not present

## 2019-04-05 DIAGNOSIS — Z1283 Encounter for screening for malignant neoplasm of skin: Secondary | ICD-10-CM | POA: Diagnosis not present

## 2019-04-05 DIAGNOSIS — X32XXXD Exposure to sunlight, subsequent encounter: Secondary | ICD-10-CM | POA: Diagnosis not present

## 2019-04-05 DIAGNOSIS — L603 Nail dystrophy: Secondary | ICD-10-CM | POA: Diagnosis not present

## 2019-04-05 DIAGNOSIS — Z08 Encounter for follow-up examination after completed treatment for malignant neoplasm: Secondary | ICD-10-CM | POA: Diagnosis not present

## 2019-04-05 DIAGNOSIS — L570 Actinic keratosis: Secondary | ICD-10-CM | POA: Diagnosis not present

## 2019-05-03 DIAGNOSIS — N183 Chronic kidney disease, stage 3 (moderate): Secondary | ICD-10-CM | POA: Diagnosis not present

## 2019-05-06 DIAGNOSIS — I129 Hypertensive chronic kidney disease with stage 1 through stage 4 chronic kidney disease, or unspecified chronic kidney disease: Secondary | ICD-10-CM | POA: Diagnosis not present

## 2019-05-06 DIAGNOSIS — M9080 Osteopathy in diseases classified elsewhere, unspecified site: Secondary | ICD-10-CM | POA: Diagnosis not present

## 2019-05-06 DIAGNOSIS — E889 Metabolic disorder, unspecified: Secondary | ICD-10-CM | POA: Diagnosis not present

## 2019-05-06 DIAGNOSIS — N183 Chronic kidney disease, stage 3 (moderate): Secondary | ICD-10-CM | POA: Diagnosis not present

## 2019-05-06 DIAGNOSIS — D631 Anemia in chronic kidney disease: Secondary | ICD-10-CM | POA: Diagnosis not present

## 2019-05-12 ENCOUNTER — Other Ambulatory Visit: Payer: Self-pay | Admitting: Family Medicine

## 2019-05-26 ENCOUNTER — Other Ambulatory Visit: Payer: Self-pay | Admitting: Cardiovascular Disease

## 2019-06-19 ENCOUNTER — Encounter (HOSPITAL_COMMUNITY): Payer: Self-pay | Admitting: Cardiovascular Disease

## 2019-06-20 ENCOUNTER — Other Ambulatory Visit: Payer: Self-pay | Admitting: Family Medicine

## 2019-06-20 DIAGNOSIS — E1142 Type 2 diabetes mellitus with diabetic polyneuropathy: Secondary | ICD-10-CM

## 2019-06-20 DIAGNOSIS — M109 Gout, unspecified: Secondary | ICD-10-CM

## 2019-06-21 ENCOUNTER — Other Ambulatory Visit (INDEPENDENT_AMBULATORY_CARE_PROVIDER_SITE_OTHER): Payer: Medicare Other

## 2019-06-21 ENCOUNTER — Other Ambulatory Visit: Payer: Self-pay

## 2019-06-21 ENCOUNTER — Other Ambulatory Visit: Payer: Medicare Other

## 2019-06-21 ENCOUNTER — Ambulatory Visit (INDEPENDENT_AMBULATORY_CARE_PROVIDER_SITE_OTHER): Payer: Medicare Other | Admitting: Cardiovascular Disease

## 2019-06-21 ENCOUNTER — Encounter: Payer: Self-pay | Admitting: Cardiovascular Disease

## 2019-06-21 ENCOUNTER — Ambulatory Visit (INDEPENDENT_AMBULATORY_CARE_PROVIDER_SITE_OTHER): Payer: Medicare Other

## 2019-06-21 VITALS — BP 124/64 | HR 72 | Temp 96.6°F | Ht 67.0 in | Wt 193.0 lb

## 2019-06-21 VITALS — Ht 67.0 in | Wt 195.0 lb

## 2019-06-21 DIAGNOSIS — E782 Mixed hyperlipidemia: Secondary | ICD-10-CM | POA: Diagnosis not present

## 2019-06-21 DIAGNOSIS — Z0000 Encounter for general adult medical examination without abnormal findings: Secondary | ICD-10-CM | POA: Diagnosis not present

## 2019-06-21 DIAGNOSIS — E1142 Type 2 diabetes mellitus with diabetic polyneuropathy: Secondary | ICD-10-CM

## 2019-06-21 DIAGNOSIS — I519 Heart disease, unspecified: Secondary | ICD-10-CM

## 2019-06-21 DIAGNOSIS — M109 Gout, unspecified: Secondary | ICD-10-CM

## 2019-06-21 DIAGNOSIS — R0600 Dyspnea, unspecified: Secondary | ICD-10-CM

## 2019-06-21 DIAGNOSIS — I6523 Occlusion and stenosis of bilateral carotid arteries: Secondary | ICD-10-CM

## 2019-06-21 DIAGNOSIS — Z008 Encounter for other general examination: Secondary | ICD-10-CM | POA: Diagnosis not present

## 2019-06-21 LAB — COMPREHENSIVE METABOLIC PANEL
ALT: 16 U/L (ref 0–53)
AST: 17 U/L (ref 0–37)
Albumin: 4.1 g/dL (ref 3.5–5.2)
Alkaline Phosphatase: 65 U/L (ref 39–117)
BUN: 32 mg/dL — ABNORMAL HIGH (ref 6–23)
CO2: 31 mEq/L (ref 19–32)
Calcium: 10.1 mg/dL (ref 8.4–10.5)
Chloride: 100 mEq/L (ref 96–112)
Creatinine, Ser: 2.32 mg/dL — ABNORMAL HIGH (ref 0.40–1.50)
GFR: 26.87 mL/min — ABNORMAL LOW (ref >60.00)
Glucose, Bld: 146 mg/dL — ABNORMAL HIGH (ref 70–99)
Potassium: 4.3 mEq/L (ref 3.5–5.1)
Sodium: 139 mEq/L (ref 135–145)
Total Bilirubin: 1.4 mg/dL — ABNORMAL HIGH (ref 0.2–1.2)
Total Protein: 7.1 g/dL (ref 6.0–8.3)

## 2019-06-21 LAB — LIPID PANEL
Cholesterol: 102 mg/dL (ref 0–200)
HDL: 25.2 mg/dL — ABNORMAL LOW (ref >39.00)
LDL Cholesterol: 56 mg/dL (ref 0–99)
NonHDL: 76.78
Total CHOL/HDL Ratio: 4
Triglycerides: 106 mg/dL (ref 0.0–149.0)
VLDL: 21.2 mg/dL (ref 0.0–40.0)

## 2019-06-21 LAB — URIC ACID: Uric Acid, Serum: 9 mg/dL — ABNORMAL HIGH (ref 4.0–7.8)

## 2019-06-21 LAB — HEMOGLOBIN A1C: Hgb A1c MFr Bld: 8.4 % — ABNORMAL HIGH (ref 4.6–6.5)

## 2019-06-21 NOTE — Progress Notes (Addendum)
Subjective:   [name] is a 85 y.o. male who presents for Medicare Annual/Subsequent preventive examination.  This visit type was conducted due to national recommendations for restrictions regarding the COVID-19 Pandemic (e.g. social distancing). This format is felt to be most appropriate for this patient at this time. All issues noted in this document were discussed and addressed. No physical exam was performed (except for noted visual exam findings with Video Visits). This patient, [name], has given permission to perform this visit via telephone. Vital signs may be absent or patient reported.  Patient location:  At home  Nurse location:  At home     Review of Systems:  n/a Cardiac Risk Factors include: advanced age (>73men, >60 women);diabetes mellitus;dyslipidemia;hypertension;male gender;obesity (BMI >30kg/m2);sedentary lifestyle     Objective:    Vitals: Ht 5\' 7"  (1.702 m) Comment: per patient  Wt 195 lb (88.5 kg) Comment: per patient  BMI 30.54 kg/m   Body mass index is 30.54 kg/m.  Advanced Directives 06/21/2019 08/02/2018 05/09/2018 05/08/2017 03/07/2016 01/03/2016 11/13/2015  Does Patient Have a Medical Advance Directive? Yes Yes Yes Yes Yes No Yes  Type of Paramedic of [place];Living will [place];Living will [place];Living will [place];Living will [place];Living will - -  Does patient want to make changes to medical advance directive? No - Patient declined No - Patient declined - - No - Patient declined - No - Patient declined  Copy of Advance in Chart? No - copy requested No - copy requested No - copy requested Yes No - copy requested - No - copy requested  Pre-existing out of facility DNR order (yellow form or pink MOST form) - - - - - - -    Tobacco Social History   Tobacco Use  Smoking Status Former Smoker  . Packs/day:  1.00  . Years: 8.00  . Pack years: 8.00  . Types: Cigarettes  Smokeless Tobacco Never Used  Tobacco Comment   "quit smoking cigarettes in 1958"     Counseling given: Not Answered Comment: "quit smoking cigarettes in 1958"   Clinical Intake:  Pre-visit preparation completed: Yes  Pain : No/denies pain     Nutritional Status: BMI > 30  Obese Nutritional Risks: None Diabetes: Yes CBG done?: No Did pt. bring in CBG monitor from home?: No  How often do you need to have someone help you when you read instructions, pamphlets, or other written materials from your doctor or pharmacy?: 1 - Never What is the last grade level you completed in school?: some college  Interpreter Needed?: No  Information entered by :: NAllen LPN  Past Medical History:  Diagnosis Date  . Age-related macular degeneration, wet, both eyes ([place])   . Anemia    Secondary to acute blood loss  . Anginal pain ([place])   . Arthritis    "mild in hands, knees, ankles" (11/13/2015)  . Atrial fibrillation ([place])    Consideration was given for atrial flutter ablation, but patient developed atrial fibrillation. Cardioversion was done. Dr. Caryl Comes decided to watch him clinically. November, 2011  . Atrial flutter [facility]) 07/2010   September, 2011   Hospital with PNA and cath done.Marland KitchenMarland KitchenCoumadin.  Atrial flutter ablation planned, but  pt. then had atrial fibrillation,/outpatient conversion 09/08/10..NSR..plan to follow..Dr. Caryl Comes  . CAD (coronary artery disease)    Catheterization, September, 2011,  grafts patent from redo CABG,, medical therapy of coronary disease,  consideration to proceeding with atrial flutter ablation  . Carotid artery disease ([place])    Doppler 09/18/2009 - 49% bilateral stenoses  . Carotid artery disease ([name])    49% bilateral, Doppler, November, 2010  . Chronic kidney disease (CKD), stage III (moderate) ([name])   . Diabetic peripheral neuropathy ([place])   . Diverticulosis of colon with hemorrhage 2009    several unit diverticular bleed   . Ejection fraction    EF 60%, echo, 2009  //   EF 65%, echo, September, 2011  . Erectile dysfunction    Mild  . Gout   . History of blood transfusion "several"   related to diverticular bleeding  . Hyperlipidemia   . Hypertension   . Mitral regurgitation    Mild, echo, September, 2011  . NSTEMI (non-ST elevated myocardial infarction) ([place]) 07/2010   at [facility] with repeat cath, rec medical mgmt   . OSA on CPAP   . Personal history of colonic polyps   . PNA (pneumonia) 9/11   NSTEMI at [facility] with repeat cath, rec medical mgmt   . RBBB (right bundle branch block)   . Shoulder pain    "positional; better now" (11/13/2015)  . Skin cancer    R lower leg, per derm 2012  . Type II diabetes mellitus ([place])    Past Surgical History:  Procedure Laterality Date  . CARDIAC CATHETERIZATION  2006   Nuclear..slight lateral ischemia..medical therapy  . CARDIAC CATHETERIZATION  08/04/2010   grafts patent from redo CABG...medical Rx and ablate Atrial flutter (LV not injected)   . CARDIOVERSION  ~ 2010  . CAROTID ENDARTERECTOMY Right 1994  . CATARACT EXTRACTION W/ INTRAOCULAR LENS  IMPLANT, BILATERAL Bilateral   . COLON RESECTION N/A 01/13/2016   Procedure: EXPLORATORY LAPAROTOMY, LEFT AND SIGMOID COLON REMOVAL;  Surgeon: Ralene Ok, MD;  Location: [place];  Service: General;  Laterality: N/A;  Extended open left hemicolectomy and sigmoidectomy   . COLONOSCOPY N/A 11/14/2015   Procedure: COLONOSCOPY;  Surgeon: Manus Gunning, MD;  Location: [facility] ENDOSCOPY;  Service: Gastroenterology;  Laterality: N/A;  . COLONOSCOPY Left 01/05/2016   Procedure: COLONOSCOPY;  Surgeon: Manus Gunning, MD;  Location: [place];  Service: Gastroenterology;  Laterality: Left;  no sedation to start, moderate if needed  . COLONOSCOPY W/ POLYPECTOMY    . CORONARY ARTERY BYPASS GRAFT  1995; 2006   "X 3; X3"  . [place], 2006  . DOPPLER  ECHOCARDIOGRAPHY  08/2008   EF 60%  . DOPPLER ECHOCARDIOGRAPHY  08/02/2010   65-70%  . DOPPLER ECHOCARDIOGRAPHY  07/2010   MR mild  . ESOPHAGOGASTRODUODENOSCOPY N/A 06/19/2014   Procedure: ESOPHAGOGASTRODUODENOSCOPY (EGD);  Surgeon: Jerene Bears, MD;  Location[facility] ENDOSCOPY;  Service: Endoscopy;  Laterality: N/A;  . ESOPHAGOGASTRODUODENOSCOPY N/A 11/14/2015   Procedure: ESOPHAGOGASTRODUODENOSCOPY (EGD);  Surgeon: Manus Gunning, MD;  Location: [place];  Service: Gastroenterology;  Laterality: N/A;  . FLEXIBLE SIGMOIDOSCOPY N/A 11/16/2015   Procedure: FLEXIBLE SIGMOIDOSCOPY;  Surgeon: Manus Gunning, MD;  Location: [place];  Service: Gastroenterology;  Laterality: N/A;  . LAPAROSCOPIC CHOLECYSTECTOMY  2008  . LEFT HEART CATH N/A 06/14/2014   Procedure: LEFT HEART CATH;  Surgeon: Sinclair Grooms, MD;  Location: [facility] CATH LAB;  Service: Cardiovascular;  Laterality: N/A;  . LEFT HEART CATH AND CORONARY ANGIOGRAPHY N/A 08/02/2018   Procedure: LEFT HEART CATH AND CORONARY ANGIOGRAPHY;  Surgeon: [name], MD;  Location: [place] CV LAB;  Service: Cardiovascular;  Laterality: N/A;  . LEFT HEART CATHETERIZATION WITH CORONARY/GRAFT ANGIOGRAM N/A 06/11/2014   Procedure: LEFT HEART CATHETERIZATION WITH Beatrix Fetters;  Surgeon: Sinclair Grooms, MD;  Location: [facility] CATH LAB;  Service: Cardiovascular;  Laterality: N/A;  . PERCUTANEOUS CORONARY STENT INTERVENTION (PCI-S) N/A 06/13/2014   Procedure: PERCUTANEOUS CORONARY STENT INTERVENTION (PCI-S);  Surgeon: Sinclair Grooms, MD;  Location: [facility] CATH LAB;  Service: Cardiovascular;  Laterality: N/A;  . SKIN CANCER EXCISION Left 10/2015   calf  . SKIN CANCER EXCISION Right 2014?   chest  . TONSILLECTOMY    . VASECTOMY     Family History  Problem Relation Age of Onset  . Kidney disease Mother        Kidney failure  . Stroke Mother   . Diabetes Mother   . Heart disease Father        MI  . Arthritis Sister   .  Cancer Sister        Throat  . Heart disease Sister        MI  . Diabetes Sister   . Prostate cancer Neg Hx   . Colon cancer Neg Hx    Social History   Socioeconomic History  . Marital status: Widowed    Spouse name: Not on file  . Number of children: 2  . Years of education: Not on file  . Highest education level: Not on file  Occupational History  . 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 Financial resource strain: Not hard at all  . Food insecurity    Worry: Never true    Inability: Never true  . Transportation needs    Medical: No    Non-medical: No  Tobacco Use  . Smoking status: Former Smoker    Packs/day: 1.00    Years: 8.00    Pack years: 8.00    Types: Cigarettes  . Smokeless tobacco: Never Used  . Tobacco comment: "quit smoking cigarettes in 1958"  Substance and Sexual Activity  . Alcohol use: No    Alcohol/week: 0.0 standard drinks  . Drug use: No  . Sexual activity: Yes  Lifestyle  . Physical activity    Days per week: 0 days    Minutes per session: 0 min  . Stress: Only a little  Relationships  . Social Herbalist on phone: Not on file    Gets together: Not on file    Attends religious service: Not on file    Active member of club or organization: Not on file    Attends meetings of clubs or organizations: Not on file    Relationship status: Not on file  Other Topics Concern  . Not on file  Social History Narrative   From [place].  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     Lives with girlfriend [name].  Widowed 12/2005.    Outpatient Encounter Medications as of 06/21/2019  Medication Sig  . apixaban (ELIQUIS) 2.5 MG TABS tablet Take 1 tablet (2.5 mg total) by mouth 2 (two) times daily.  [name] aspirin 81 MG tablet Take 81 mg by mouth daily.  . BD INSULIN SYRINGE ULTRAFINE 31G X 5/16" 0.3 ML MISC USE DAILY AS INSTRUCTED  . Cholecalciferol (VITAMIN D) 1000 UNITS  capsule Take 1,000 Units by mouth 2 (two) times a day.   . colchicine 0.6 MG tablet Take 1 tablet (0.6 mg total)  by mouth daily as needed (gout flare up). (Patient taking differently: Take 0.6 mg by mouth daily as needed (Fri, Sat, Sun, Mon). )  . fish oil-omega-3 fatty acids 1000 MG capsule Take 1 g by mouth daily.   . furosemide (LASIX) 80 MG tablet Take one 80 mg tablet in the morning. Take HALF tablet 40mg  in the afternoon  . glucose blood (FREESTYLE LITE) test strip USE TO TEST BLOOD SUGAR ONCE DAILY AND AS NEEDED FOR DIABETES MELLITUS  . insulin glargine (LANTUS) 100 UNIT/ML injection INJECT 20 TO 25 UNITS AT BEDTIME (Patient taking differently: Inject 25 Units into the skin at bedtime. )  . isosorbide mononitrate (IMDUR) 60 MG 24 hr tablet Take 90mg  in the am and 60mg  in the pm  . magnesium oxide (MAG-OX) 400 MG tablet Take 400 mg by mouth daily.   . metoprolol tartrate (LOPRESSOR) 50 MG tablet TAKE 1 TABLET TWICE A DAY  . Multiple Vitamin (MULTIVITAMIN WITH MINERALS) TABS Take 1 tablet by mouth daily.  . nitroGLYCERIN (NITROSTAT) 0.4 MG SL tablet Place 1 tablet (0.4 mg total) under the tongue every 5 (five) minutes as needed for chest pain.  [name] Potassium Gluconate (K-99) 595 MG CAPS Take 595 mg by mouth daily.   . ranolazine (RANEXA) 500 MG 12 hr tablet TAKE 1 TABLET TWICE A DAY  . rosuvastatin (CRESTOR) 40 MG tablet Take 1 tablet (40 mg total) by mouth at bedtime.   No facility-administered encounter medications on file as of 06/21/2019.     Activities of Daily Living In your present state of health, do you have any difficulty performing the following activities: 06/21/2019  Hearing? Y  Comment wear hearing aides  Vision? Y  Comment macular degeneration  Difficulty concentrating or making decisions? N  Walking or climbing stairs? Y  Comment due to stiffness in knees  Dressing or bathing? N  Doing errands, shopping? Y  Comment due to someone driving him  Preparing Food and eating ? N   Using the Toilet? N  In the past six months, have you accidently leaked urine? N  Do you have problems with loss of bowel control? N  Managing your Medications? N  Managing your Finances? N  Housekeeping or managing your Housekeeping? N  Some recent data might be hidden    Patient Care Team: Tonia Ghent, MD as PCP - General Gwenlyn Found Pearletha Forge, MD as PCP - Cardiology (Cardiology) Royann Shivers, MD as Referring Physician (Nephrology) [name], MD as Consulting Physician (Cardiology) Barbaraann Cao, OD as Referring Physician (Optometry) Allyn Kenner, MD as Consulting Physician (Dermatology)   Assessment:   This is a routine wellness examination for [name].  Exercise Activities and Dietary recommendations Current Exercise Habits: The patient does not participate in regular exercise at present  Goals    . DIET - REDUCE SUGAR INTAKE     06/21/2019, wants to cut back on sweets    . Patient Stated     Starting 05/08/2018, I will continue to take medications as prescribed.        Fall Risk Fall Risk  06/21/2019 05/09/2018 06/09/2017 05/08/2017 03/07/2016  Falls in the past year? 0 No No No Yes  Comment - - Emmi Telephone Survey: data to providers prior to load - fell down steps while carrying case of water  Number falls in past yr: - - - - 1  Injury with Fall? - - - - No  Risk for fall due to : Impaired balance/gait;Medication  side effect - - - -  Follow up Falls evaluation completed;Falls prevention discussed - - - Falls evaluation completed;Education provided   Is the patient's home free of loose throw rugs in walkways, pet beds, electrical cords, etc?   yes      Grab bars in the bathroom? yes      Handrails on the stairs?   no      Adequate lighting?   yes  Timed Get Up and Go Performed: n/a  Depression Screen PHQ 2/9 Scores 06/21/2019 05/09/2018 05/08/2017 03/07/2016  PHQ - 2 Score 0 0 0 0  PHQ- 9 Score 2 0 - -    Cognitive Function MMSE - Mini Mental State  Exam 06/21/2019 05/09/2018 05/08/2017 03/07/2016  Orientation to time 5 5 5 5   Orientation to Place 5 5 5 5   Registration 3 3 3 3   Attention/ Calculation 5 0 0 0  Recall 2 3 3 3   Recall-comments missed one - - -  Language- name 2 objects 0 0 0 0  Language- repeat 1 1 1 1   Language- follow 3 step command 0 3 3 3   Language- read & follow direction 0 0 0 0  Write a sentence 0 0 0 0  Copy design 0 0 0 0  Total score 21 20 20 20    Mini Cog  Mini-Cog screen was completed. Maximum score is 22. A value of 0 denotes this part of the MMSE was not completed or the patient failed this part of the Mini-Cog screening.       Immunization History  Administered Date(s) Administered  . Influenza Split 09/05/2011, 09/24/2012, 07/17/2014  . Influenza Whole 09/14/2006, 09/12/2007  . Influenza,inj,Quad PF,6+ Mos 09/19/2013, 08/31/2015, 07/07/2016  . Pneumococcal Conjugate-13 04/28/2015  . Pneumococcal Polysaccharide-23 09/14/2002  . Td 12/10/1998, 03/16/2009  . Zoster 10/04/2007    Qualifies for Shingles Vaccine? yes  Screening Tests Health Maintenance  Topic Date Due  . TETANUS/TDAP  03/17/2019  . OPHTHALMOLOGY EXAM  04/14/2019  . URINE MICROALBUMIN  05/10/2019  . INFLUENZA VACCINE  06/15/2019  . FOOT EXAM  06/23/2019  . HEMOGLOBIN A1C  08/04/2019  . PNA vac Low Risk Adult  Completed   Cancer Screenings: Lung: Low Dose CT Chest recommended if Age 46-80 years, 30 pack-year currently smoking OR have quit w/in 15years. Patient does not qualify. Colorectal: not required  Additional Screenings:  Hepatitis C Screening:n/a      Plan:   Patient wants to cut back on eating sweets.  I have personally reviewed and noted the following in the patient's chart:   . Medical and social history . Use of alcohol, tobacco or illicit drugs  . Current medications and supplements . Functional ability and status . Nutritional status . Physical activity . Advanced directives . List of other physicians  . Hospitalizations, surgeries, and ER visits in previous 12 months . Vitals . Screenings to include cognitive, depression, and falls . Referrals and appointments  In addition, I have reviewed and discussed with patient certain preventive protocols, quality metrics, and best practice recommendations. A written personalized care plan for preventive services as well as general preventive health recommendations were provided to patient.     Kellie Simmering, LPN  12/19/8525  I reviewed health advisor's note, was available for consultation, and agree with documentation and plan.

## 2019-06-21 NOTE — Assessment & Plan Note (Signed)
History of chronic combined systolic and diastolic heart failure on high-dose diuretics.

## 2019-06-21 NOTE — Patient Instructions (Signed)
[name] , Thank you for taking time to come for your Medicare Wellness Visit. I appreciate your ongoing commitment to your health goals. Please review the following plan we discussed and let me know if I can assist you in the future.   Screening recommendations/referrals: Colonoscopy: not required Recommended yearly ophthalmology/optometry visit for glaucoma screening and checkup Recommended yearly dental visit for hygiene and checkup  Vaccinations: Influenza vaccine: 06/2016 Pneumococcal vaccine: 04/2015 Tdap vaccine: due Shingles vaccine: discussed    Advanced directives: Please bring a copy of your POA (Power of Attorney) and/or Living Will to your next appointment.    Conditions/risks identified: obesity  Next appointment: 06/25/2019 at 10:45  Preventive Care 83 Years and Older, Male Preventive care refers to lifestyle choices and visits with your health care provider that can promote health and wellness. What does preventive care include?  A yearly physical exam. This is also called an annual well check.  Dental exams once or twice a year.  Routine eye exams. Ask your health care provider how often you should have your eyes checked.  Personal lifestyle choices, including:  Daily care of your teeth and gums.  Regular physical activity.  Eating a healthy diet.  Avoiding tobacco and drug use.  Limiting alcohol use.  Practicing safe sex.  Taking low doses of aspirin every day.  Taking vitamin and mineral supplements as recommended by your health care provider. What happens during an annual well check? The services and screenings done by your health care provider during your annual well check will depend on your age, overall health, lifestyle risk factors, and family history of disease. Counseling  Your health care provider may ask you questions about your:  Alcohol use.  Tobacco use.  Drug use.  Emotional well-being.  Home and relationship well-being.   Sexual activity.  Eating habits.  History of falls.  Memory and ability to understand (cognition).  Work and work Statistician. Screening  You may have the following tests or measurements:  Height, weight, and BMI.  Blood pressure.  Lipid and cholesterol levels. These may be checked every 5 years, or more frequently if you are over 41 years old.  Skin check.  Lung cancer screening. You may have this screening every year starting at age 72 if you have a 30-pack-year history of smoking and currently smoke or have quit within the past 15 years.  Fecal occult blood test (FOBT) of the stool. You may have this test every year starting at age 71.  Flexible sigmoidoscopy or colonoscopy. You may have a sigmoidoscopy every 5 years or a colonoscopy every 10 years starting at age 15.  Prostate cancer screening. Recommendations will vary depending on your family history and other risks.  Hepatitis C blood test.  Hepatitis B blood test.  Sexually transmitted disease (STD) testing.  Diabetes screening. This is done by checking your blood sugar (glucose) after you have not eaten for a while (fasting). You may have this done every 1-3 years.  Abdominal aortic aneurysm (AAA) screening. You may need this if you are a current or former smoker.  Osteoporosis. You may be screened starting at age 36 if you are at high risk. Talk with your health care provider about your test results, treatment options, and if necessary, the need for more tests. Vaccines  Your health care provider may recommend certain vaccines, such as:  Influenza vaccine. This is recommended every year.  Tetanus, diphtheria, and acellular pertussis (Tdap, Td) vaccine. You may need a Td booster  every 10 years.  Zoster vaccine. You may need this after age 15.  Pneumococcal 13-valent conjugate (PCV13) vaccine. One dose is recommended after age 105.  Pneumococcal polysaccharide (PPSV23) vaccine. One dose is recommended after  age 44. Talk to your health care provider about which screenings and vaccines you need and how often you need them. This information is not intended to replace advice given to you by your health care provider. Make sure you discuss any questions you have with your health care provider. Document Released: 11/27/2015 Document Revised: 07/20/2016 Document Reviewed: 09/01/2015 Elsevier Interactive Patient Education  2017 [place] Prevention in the Home Falls can cause injuries. They can happen to people of all ages. There are many things you can do to make your home safe and to help prevent falls. What can I do on the outside of my home?  Regularly fix the edges of walkways and driveways and fix any cracks.  Remove anything that might make you trip as you walk through a door, such as a raised step or threshold.  Trim any bushes or trees on the path to your home.  Use bright outdoor lighting.  Clear any walking paths of anything that might make someone trip, such as rocks or tools.  Regularly check to see if handrails are loose or broken. Make sure that both sides of any steps have handrails.  Any raised decks and porches should have guardrails on the edges.  Have any leaves, snow, or ice cleared regularly.  Use sand or salt on walking paths during winter.  Clean up any spills in your garage right away. This includes oil or grease spills. What can I do in the bathroom?  Use night lights.  Install grab bars by the toilet and in the tub and shower. Do not use towel bars as grab bars.  Use non-skid mats or decals in the tub or shower.  If you need to sit down in the shower, use a plastic, non-slip stool.  Keep the floor dry. Clean up any water that spills on the floor as soon as it happens.  Remove soap buildup in the tub or shower regularly.  Attach bath mats securely with double-sided non-slip rug tape.  Do not have throw rugs and other things on the floor that can  make you trip. What can I do in the bedroom?  Use night lights.  Make sure that you have a light by your bed that is easy to reach.  Do not use any sheets or blankets that are too big for your bed. They should not hang down onto the floor.  Have a firm chair that has side arms. You can use this for support while you get dressed.  Do not have throw rugs and other things on the floor that can make you trip. What can I do in the kitchen?  Clean up any spills right away.  Avoid walking on wet floors.  Keep items that you use a lot in easy-to-reach places.  If you need to reach something above you, use a strong step stool that has a grab bar.  Keep electrical cords out of the way.  Do not use floor polish or wax that makes floors slippery. If you must use wax, use non-skid floor wax.  Do not have throw rugs and other things on the floor that can make you trip. What can I do with my stairs?  Do not leave any items on the stairs.  Make  sure that there are handrails on both sides of the stairs and use them. Fix handrails that are broken or loose. Make sure that handrails are as long as the stairways.  Check any carpeting to make sure that it is firmly attached to the stairs. Fix any carpet that is loose or worn.  Avoid having throw rugs at the top or bottom of the stairs. If you do have throw rugs, attach them to the floor with carpet tape.  Make sure that you have a light switch at the top of the stairs and the bottom of the stairs. If you do not have them, ask someone to add them for you. What else can I do to help prevent falls?  Wear shoes that:  Do not have high heels.  Have rubber bottoms.  Are comfortable and fit you well.  Are closed at the toe. Do not wear sandals.  If you use a stepladder:  Make sure that it is fully opened. Do not climb a closed stepladder.  Make sure that both sides of the stepladder are locked into place.  Ask someone to hold it for you,  if possible.  Clearly mark and make sure that you can see:  Any grab bars or handrails.  First and last steps.  Where the edge of each step is.  Use tools that help you move around (mobility aids) if they are needed. These include:  Canes.  Walkers.  Scooters.  Crutches.  Turn on the lights when you go into a dark area. Replace any light bulbs as soon as they burn out.  Set up your furniture so you have a clear path. Avoid moving your furniture around.  If any of your floors are uneven, fix them.  If there are any pets around you, be aware of where they are.  Review your medicines with your doctor. Some medicines can make you feel dizzy. This can increase your chance of falling. Ask your doctor what other things that you can do to help prevent falls. This information is not intended to replace advice given to you by your health care provider. Make sure you discuss any questions you have with your health care provider. Document Released: 08/27/2009 Document Revised: 04/07/2016 Document Reviewed: 12/05/2014 Elsevier Interactive Patient Education  2017 [employer].

## 2019-06-21 NOTE — Progress Notes (Signed)
06/21/2019 [name]   [birth year]  [id number]  Primary Physician Tonia Ghent, MD Primary Cardiologist: [name] MD [name], [place], [place]  HPI:  [name] is a 85 y.o.   engaged Caucasian male formerly a patient of [name]. I last saw him in the office 08/24/2018. Mr. 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 His past medical history is remarkable for coronary artery bypass grafting in 1996 and again in 2006 by [name]. He had remote right carotid endarterectomy by [name] in 1994. History of hypertension, hyperlipidemia and diabetes. He was hospitalized in late July 2015 because of unstable angina. [name] performed cardiac catheterization revealing a high-grade obtuse marginal branch vein graft stenosis which was stented. Because of recurrent chest pain he was re-intervention relates later and found to have a new hazy lesion just proximal to the previously placed stent which was again intervened on. He was deemed a Plavix nonresponder and was begun on Brilenta. He also has a remote history of PAF as well as diverticulitis and has had GI bleed on Coumadin. His oral anticoagulation was discontinued.  He was recently admitted on 11/13/15 for a GI bleed. He underwent endoscopy revealing gastritis, colonoscopy revealing bleeding diverticula which was treated with clipping. He had 3 units of packed red blood cell transfusion. He did have chest pain and positive enzymes after his endoscopy within the echo that showed an EF of 50% with a new anterior wall motion and reality. He was thought to have "a non-STEMI. His Plavix was discontinued. Because of his moderate renal insufficiency and his inability to take antiplatelet therapy he did not undergo repeat cardiac catheterization. He underwent left-sided hemicolectomy and sigmoidectomy  because of chronic GI bleeding. This was uneventful. His hemoglobin has remained stablein the 12 range last checked in April. Because of his new onset chest pain and dyspnea performed a Myoview stress test that showed a new inferolateral scar/ischemic abnormality and echo that showed segmental wall motion abnormalities.  Because of this I performed diagnostic coronary arteriography on him 08/02/2018 revealing a newly occluded RCA vein graft with a high-grade first circumflex obtuse marginal branch ostial stenosis which I was unable to access because of geometry.  S  He saw Kerin Ransom back in the office 03/20/2019 complaining of increasing dyspnea.  Low-dose long-acting nitrates were added.  Hemoglobin was stable at 11.  He continues to complain of increasing dyspnea.  He is in A. fib with a controlled ventricular response on Eliquis.  He denies chest pain.   Current Meds  Medication Sig  . apixaban (ELIQUIS) 2.5 MG TABS tablet Take 1 tablet (2.5 mg total) by mouth 2 (two) times daily.  [name] aspirin 81 MG tablet Take 81 mg by mouth daily.  . BD INSULIN SYRINGE ULTRAFINE 31G X 5/16" 0.3 ML MISC USE DAILY AS INSTRUCTED  . Cholecalciferol (VITAMIN D) 1000 UNITS capsule Take 1,000 Units by mouth 2 (two) times a day.   . colchicine 0.6 MG tablet Take 1 tablet (0.6 mg total) by mouth daily as needed (gout flare up). (Patient taking differently: Take 0.6 mg by mouth daily as needed (Fri, Sat, Sun, Mon). )  . fish oil-omega-3 fatty acids 1000 MG capsule Take 1 g by mouth daily.   . furosemide (LASIX) 80 MG tablet Take one 80 mg tablet in the  morning. Take HALF tablet 40mg  in the afternoon  . glucose blood (FREESTYLE LITE) test strip USE TO TEST BLOOD SUGAR ONCE DAILY AND AS NEEDED FOR DIABETES MELLITUS  . insulin glargine (LANTUS) 100 UNIT/ML injection INJECT 20 TO 25 UNITS AT BEDTIME (Patient taking differently: Inject 25 Units into the skin at bedtime. )  . isosorbide mononitrate (IMDUR) 60 MG 24 hr tablet  Take 90mg  in the am and 60mg  in the pm  . magnesium oxide (MAG-OX) 400 MG tablet Take 400 mg by mouth daily.   . metoprolol tartrate (LOPRESSOR) 50 MG tablet TAKE 1 TABLET TWICE A DAY  . Multiple Vitamin (MULTIVITAMIN WITH MINERALS) TABS Take 1 tablet by mouth daily.  . nitroGLYCERIN (NITROSTAT) 0.4 MG SL tablet Place 1 tablet (0.4 mg total) under the tongue every 5 (five) minutes as needed for chest pain.  [name] Potassium Gluconate (K-99) 595 MG CAPS Take 595 mg by mouth daily.   . ranolazine (RANEXA) 500 MG 12 hr tablet TAKE 1 TABLET TWICE A DAY  . rosuvastatin (CRESTOR) 40 MG tablet Take 1 tablet (40 mg total) by mouth at bedtime.     No Known Allergies  Social History   Socioeconomic History  . Marital status: Widowed    Spouse name: Not on file  . Number of children: 2  . Years of education: Not on file  . Highest education level: Not on file  Occupational History  . 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 Financial resource strain: Not hard at all  . Food insecurity    Worry: Never true    Inability: Never true  . Transportation needs    Medical: No    Non-medical: No  Tobacco Use  . Smoking status: Former Smoker    Packs/day: 1.00    Years: 8.00    Pack years: 8.00    Types: Cigarettes  . Smokeless tobacco: Never Used  . Tobacco comment: "quit smoking cigarettes in 1958"  Substance and Sexual Activity  . Alcohol use: No    Alcohol/week: 0.0 standard drinks  . Drug use: No  . Sexual activity: Yes  Lifestyle  . Physical activity    Days per week: 0 days    Minutes per session: 0 min  . Stress: Only a little  Relationships  . Social Herbalist on phone: Not on file    Gets together: Not on file    Attends religious service: Not on file    Active member of club or organization: Not on file    Attends meetings of clubs or organizations: Not on file    Relationship status: Not on file  . Intimate partner  violence    Fear of current or ex partner: No    Emotionally abused: No    Physically abused: No    Forced sexual activity: No  Other Topics Concern  . Not on file  Social History Narrative   From [place].  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     Lives with girlfriend [name].  Widowed 12/2005.     Review of Systems: General: negative for chills, fever, night sweats or weight changes.  Cardiovascular: negative for chest pain, dyspnea on exertion, edema, orthopnea, palpitations, paroxysmal nocturnal dyspnea or shortness of breath Dermatological: negative for rash Respiratory: negative for cough or wheezing Urologic: negative for hematuria Abdominal: negative for nausea, vomiting, diarrhea, bright red blood  per rectum, melena, or hematemesis Neurologic: negative for visual changes, syncope, or dizziness All other systems reviewed and are otherwise negative except as noted above.    Blood pressure 124/64, pulse 72, temperature (!) 96.6 F (35.9 C), height 5\' 7"  (1.702 m), weight 193 lb (87.5 kg).  General appearance: alert and no distress Neck: no adenopathy, no carotid bruit, no JVD, supple, symmetrical, trachea midline and thyroid not enlarged, symmetric, no tenderness/mass/nodules Lungs: clear to auscultation bilaterally Heart: irregularly irregular rhythm Extremities: extremities normal, atraumatic, no cyanosis or edema Pulses: 2+ and symmetric Skin: Skin color, texture, turgor normal. No rashes or lesions Neurologic: Alert and oriented X 3, normal strength and tone. Normal symmetric reflexes. Normal coordination and gait  EKG atrial fibrillation with ventricular spots of 72, right bundle branch block and left anterior fascicular block (bifascicular block).  I personally reviewed this EKG  ASSESSMENT AND PLAN:   HLD (hyperlipidemia) History of hyperlipidemia on Crestor with lipid profile performed 05/09/2018 revealing a total cholesterol of  105, LDL 57 and HDL of 29.  Atrial flutter ([name]) History of A. fib atrial fibrillation atrial flutter currently in A. fib with a ventricular response of 72 on low-dose Eliquis  Hx of CABG History of CAD status post remote bypass grafting in 1996 with repeat bypass grafting by [name] in 2006.  He underwent cardiac cath by [name] and marginal vein graft intervention.  He apparently was a Plavix nonresponder and was begun on Brilinta at that time.  Because of progressive dyspnea I performed a Myoview stress test and ultimate cardiac catheterization 08/02/2018 revealing a newly occluded RCA vein graft and high-grade ostial first obtuse marginal branch stenosis which I was unable to cross because of geometry and elected to treat him medically.  Mitral regurgitation History of moderate MR by 2D echo 07/10/2018 with moderate to moderately severe pulmonary artery hypertension.  We will repeat a 2D echo because of progressive dyspnea.  Chronic combined systolic and diastolic CHF (congestive heart failure) ([name]) History of chronic combined systolic and diastolic heart failure on high-dose diuretics.  Pulmonary hypertension ([name]) History of palmar hypertension with PA pressures in the 60 range.      [name] MD FACP,FACC,FAHA, [facility] 06/21/2019 3:36 PM

## 2019-06-21 NOTE — Assessment & Plan Note (Signed)
History of CAD status post remote bypass grafting in 1996 with repeat bypass grafting by [name] in 2006.  He underwent cardiac cath by [name] and marginal vein graft intervention.  He apparently was a Plavix nonresponder and was begun on Brilinta at that time.  Because of progressive dyspnea I performed a Myoview stress test and ultimate cardiac catheterization 08/02/2018 revealing a newly occluded RCA vein graft and high-grade ostial first obtuse marginal branch stenosis which I was unable to cross because of geometry and elected to treat him medically.

## 2019-06-21 NOTE — Assessment & Plan Note (Signed)
History of palmar hypertension with PA pressures in the 60 range.

## 2019-06-21 NOTE — Assessment & Plan Note (Signed)
History of moderate MR by 2D echo 07/10/2018 with moderate to moderately severe pulmonary artery hypertension.  We will repeat a 2D echo because of progressive dyspnea.

## 2019-06-21 NOTE — Assessment & Plan Note (Addendum)
History of A. fib atrial fibrillation atrial flutter currently in A. fib with a ventricular response of 72 on low-dose Eliquis

## 2019-06-21 NOTE — Patient Instructions (Signed)
Medication Instructions:  Your physician recommends that you continue on your current medications as directed. Please refer to the Current Medication list given to you today.  If you need a refill on your cardiac medications before your next appointment, please call your pharmacy.   Lab work: none If you have labs (blood work) drawn today and your tests are completely normal, you will receive your results only by: [name] MyChart Message (if you have MyChart) OR . A paper copy in the mail If you have any lab test that is abnormal or we need to change your treatment, we will call you to review the results.  Testing/Procedures: Your physician has requested that you have an echocardiogram. Echocardiography is a painless test that uses sound waves to create images of your heart. It provides your doctor with information about the size and shape of your heart and how well your heart's chambers and valves are working. This procedure takes approximately one hour. There are no restrictions for this procedure. LOCATION: [facility]: [address], [place], [place] [address]    Follow-Up: At [facility], you and your health needs are our priority.  As part of our continuing mission to provide you with exceptional heart care, we have created designated Provider Care Teams.  These Care Teams include your primary Cardiologist (physician) and Advanced Practice Providers (APPs -  Physician Assistants and Nurse Practitioners) who all work together to provide you with the care you need, when you need it. You will need a follow up appointment in 3 months with Kerin Ransom, PA-C and in 6 months with Dr. Gwenlyn Found.  Please call our office 2 months in advance to schedule this appointment.

## 2019-06-21 NOTE — Progress Notes (Signed)
PCP notes:  Health Maintenance:  Tetanus and eye exam are due.  Abnormal Screenings:  None  Patient concerns:  Would like to get tetanus at appointment  Nurse concerns:  None  Next PCP appt.: 06/25/2019 at 10:45

## 2019-06-21 NOTE — Assessment & Plan Note (Signed)
History of hyperlipidemia on Crestor with lipid profile performed 05/09/2018 revealing a total cholesterol of 105, LDL 57 and HDL of 29.

## 2019-06-25 ENCOUNTER — Ambulatory Visit (HOSPITAL_COMMUNITY): Payer: Medicare Other | Attending: Cardiology

## 2019-06-25 ENCOUNTER — Encounter: Payer: Self-pay | Admitting: Family Medicine

## 2019-06-25 ENCOUNTER — Ambulatory Visit (INDEPENDENT_AMBULATORY_CARE_PROVIDER_SITE_OTHER): Payer: Medicare Other | Admitting: Family Medicine

## 2019-06-25 ENCOUNTER — Other Ambulatory Visit: Payer: Self-pay

## 2019-06-25 VITALS — BP 102/50 | HR 104 | Temp 98.0°F | Ht 66.0 in | Wt 194.4 lb

## 2019-06-25 DIAGNOSIS — I6523 Occlusion and stenosis of bilateral carotid arteries: Secondary | ICD-10-CM | POA: Diagnosis not present

## 2019-06-25 DIAGNOSIS — I519 Heart disease, unspecified: Secondary | ICD-10-CM | POA: Diagnosis not present

## 2019-06-25 DIAGNOSIS — Z23 Encounter for immunization: Secondary | ICD-10-CM

## 2019-06-25 DIAGNOSIS — E118 Type 2 diabetes mellitus with unspecified complications: Secondary | ICD-10-CM

## 2019-06-25 DIAGNOSIS — Z0000 Encounter for general adult medical examination without abnormal findings: Secondary | ICD-10-CM

## 2019-06-25 DIAGNOSIS — R0600 Dyspnea, unspecified: Secondary | ICD-10-CM | POA: Diagnosis not present

## 2019-06-25 DIAGNOSIS — R0609 Other forms of dyspnea: Secondary | ICD-10-CM

## 2019-06-25 DIAGNOSIS — N184 Chronic kidney disease, stage 4 (severe): Secondary | ICD-10-CM

## 2019-06-25 DIAGNOSIS — E782 Mixed hyperlipidemia: Secondary | ICD-10-CM

## 2019-06-25 DIAGNOSIS — M109 Gout, unspecified: Secondary | ICD-10-CM

## 2019-06-25 DIAGNOSIS — IMO0001 Reserved for inherently not codable concepts without codable children: Secondary | ICD-10-CM

## 2019-06-25 DIAGNOSIS — I119 Hypertensive heart disease without heart failure: Secondary | ICD-10-CM

## 2019-06-25 DIAGNOSIS — Z0001 Encounter for general adult medical examination with abnormal findings: Secondary | ICD-10-CM

## 2019-06-25 DIAGNOSIS — Z029 Encounter for administrative examinations, unspecified: Secondary | ICD-10-CM

## 2019-06-25 DIAGNOSIS — Z7189 Other specified counseling: Secondary | ICD-10-CM

## 2019-06-25 DIAGNOSIS — Z794 Long term (current) use of insulin: Secondary | ICD-10-CM | POA: Diagnosis not present

## 2019-06-25 LAB — ECHOCARDIOGRAM COMPLETE
Height: 66 in
Weight: 3110 oz

## 2019-06-25 MED ORDER — FUROSEMIDE 80 MG PO TABS: ORAL_TABLET | ORAL | Status: DC

## 2019-06-25 MED ORDER — PERFLUTREN LIPID MICROSPHERE
1.0000 mL | INTRAVENOUS | Status: AC | PRN
  Administered 2019-06-25: 2 mL via INTRAVENOUS

## 2019-06-25 MED ORDER — COLCHICINE 0.6 MG PO TABS: 0.6000 mg | ORAL_TABLET | Freq: Every day | ORAL | Status: DC | PRN

## 2019-06-25 MED ORDER — INSULIN GLARGINE 100 UNIT/ML ~~LOC~~ SOLN: 25.0000 [IU] | Freq: Every day | SUBCUTANEOUS | Status: DC

## 2019-06-25 NOTE — Progress Notes (Addendum)
Diabetes:  Using medications without difficulties: yes, usually ~25 units lantus daily.   Hypoglycemic episodes:no Hyperglycemic episodes:no Feet problems: he has neuropathy at baseline, more sx late at night, not in the daytime.   Blood Sugars averaging: ~160-200s.   eye exam within last year: He had seen Dr. Zigmund Daniel re: his eyes and he'll f/u when possible.     Elevated Cholesterol: Using medications without problems:yes Muscle aches: no Diet compliance: encouraged.   Exercise: encouraged Labs d/w pt.    Hypertension:    Using medication without problems or lightheadedness: yes Chest pain with exertion: no Edema:trace Short of breath: with exertion, this is progressive from the last visit here. He has seen cards with echo pending for today.    Colon and prostate CA screening deferred.  D/w pt.  He agreed.  No blood in stool.  Tetanus 2020 Flu done yearly PNA up to date.  Shingles 2008, d/w pt about shingrix.   Advance directive d/w pt. Daughter designated if patient were incapacitated.   CKD.  He has been on 80mg  lasix in the AM with rare use of PM 40mg  dose, not recently.  Labs discussed with patient.  Colchicine used Friday through Monday.  No ADE on med.  No recent flares.  Compliant.  We talked about his memory, he had 2/3 recall and he remembered that today.  Discussed.  He didn't think he had a memory problem that needed eval.  He has had trouble with names lifelong.  No red flag symptoms.  PMH and SH reviewed  Meds, vitals, and allergies reviewed.   ROS: Per HPI unless specifically indicated in ROS section   GEN: nad, alert and oriented HEENT: ncat NECK: supple w/o LA CV: rrr. PULM: ctab, no inc wob ABD: soft, +bs EXT: 1+ BLE edema SKIN: no acute rash  Diabetic foot exam: Normal inspection No skin breakdown No calluses  Normal DP pulses Decrease sensation to light touch and monofilament Nails normal

## 2019-06-25 NOTE — Patient Instructions (Addendum)
I'll await the echo report.  If your sugar doesn't come down with diet, then okay to add 1 unit of insulin per day if needed.  Goal sugar ~150 in the AMs.   Recheck in about 4 months, sooner if needed.   Update me as needed.  Take care.  Glad to see you.

## 2019-06-26 ENCOUNTER — Telehealth: Payer: Self-pay | Admitting: *Deleted

## 2019-06-26 DIAGNOSIS — I272 Pulmonary hypertension, unspecified: Secondary | ICD-10-CM

## 2019-06-26 NOTE — Telephone Encounter (Addendum)
-----   Message from [name], MD sent at 06/26/2019  8:10 AM EDT ----- With mild to moderate decrease in LV function, EF in the 40% range, at least moderate MR and severe pulmonary hypertension he would benefit from being referred to the advanced heart failure clinic for further evaluation.  Left message for pt to call, referral to heart failure placed and staff message sent.

## 2019-06-26 NOTE — Telephone Encounter (Signed)
Follow Up:    Returning your call, concerning his Echo results. 

## 2019-06-27 NOTE — Assessment & Plan Note (Addendum)
He will continue work on diet.  If his sugar does not come down with diet, then okay to add 1 unit of insulin per day if needed.  Goal sugar ~150 in the AMs.   He is putting up with neuropathy symptoms at night.  He did not want treatment for that yet.  We talked about sugar control. Recheck in about 4 months, sooner if needed.  He agrees with plan.

## 2019-06-27 NOTE — Telephone Encounter (Signed)
Spoke with pt, .aware of results.  

## 2019-06-27 NOTE — Assessment & Plan Note (Signed)
No change in meds at this point.  He agrees.

## 2019-06-27 NOTE — Assessment & Plan Note (Signed)
He has been on 80mg  lasix in the AM with rare use of PM 40mg  dose, not recently.  Labs discussed with patient.  Med list updated.  No change in meds at this point.  He agrees.

## 2019-06-27 NOTE — Assessment & Plan Note (Signed)
Continue work on diet.  No change in meds at this point.  Labs discussed with patient.  He agrees.

## 2019-06-27 NOTE — Assessment & Plan Note (Signed)
Colchicine used Friday through Monday.  No ADE on med.  No recent flares.  Compliant.  Continue as is.  He agrees.

## 2019-06-27 NOTE — Assessment & Plan Note (Signed)
Short of breath: with exertion, this is progressive from the last visit here. He has seen cards with echo pending for today.  I will await the echo and cardiology input.

## 2019-06-27 NOTE — Assessment & Plan Note (Signed)
Advance directive d/w pt.  Daughter designated if patient were incapacitated.   

## 2019-06-27 NOTE — Assessment & Plan Note (Addendum)
Colon and prostate CA screening deferred.  D/w pt.  He agreed.  No blood in stool.  Tetanus 2020 Flu done yearly PNA up to date.  Shingles 2008, d/w pt about shingrix.   Advance directive d/w pt. Daughter designated if patient were incapacitated.   We talked about his memory, he had 2/3 recall and he remembered that today.  Discussed.  He didn't think he had a memory problem that needed eval.  He has had trouble with names lifelong.  No red flag symptoms.  We agreed to observe for now.

## 2019-07-18 ENCOUNTER — Other Ambulatory Visit (HOSPITAL_COMMUNITY): Payer: Self-pay

## 2019-07-24 ENCOUNTER — Other Ambulatory Visit (HOSPITAL_COMMUNITY): Payer: Self-pay

## 2019-07-26 ENCOUNTER — Other Ambulatory Visit: Payer: Self-pay | Admitting: Family Medicine

## 2019-08-02 ENCOUNTER — Telehealth: Payer: Self-pay | Admitting: Cardiovascular Disease

## 2019-08-02 MED ORDER — APIXABAN 2.5 MG PO TABS: 2.5000 mg | ORAL_TABLET | Freq: Two times a day (BID) | ORAL | 1 refills | Status: DC

## 2019-08-02 NOTE — Telephone Encounter (Signed)
New message   Patient states that he needs a new prescription for apixaban (ELIQUIS) 2.5 MG TABS tablet sent to [facility], [place].

## 2019-08-02 NOTE — Telephone Encounter (Signed)
87 M 88.2 kg, SCr 2.3 (06/2019), LOV JB 06/2019

## 2019-08-15 ENCOUNTER — Other Ambulatory Visit: Payer: Self-pay

## 2019-08-15 MED ORDER — ROSUVASTATIN CALCIUM 40 MG PO TABS: 40.0000 mg | ORAL_TABLET | Freq: Every day | ORAL | 3 refills | Status: DC

## 2019-08-24 ENCOUNTER — Other Ambulatory Visit: Payer: Self-pay | Admitting: Cardiovascular Disease

## 2019-08-25 ENCOUNTER — Other Ambulatory Visit: Payer: Self-pay | Admitting: Cardiovascular Disease

## 2019-08-26 DIAGNOSIS — Z23 Encounter for immunization: Secondary | ICD-10-CM | POA: Diagnosis not present

## 2019-09-22 ENCOUNTER — Other Ambulatory Visit: Payer: Self-pay | Admitting: Cardiology

## 2019-09-22 NOTE — Progress Notes (Addendum)
Virtual Visit via Telephone Note   This visit type was conducted due to national recommendations for restrictions regarding the COVID-19 Pandemic (e.g. social distancing) in an effort to limit this patient's exposure and mitigate transmission in our community.  Due to his co-morbid illnesses, this patient is at least at moderate risk for complications without adequate follow up.  This format is felt to be most appropriate for this patient at this time.  The patient did not have access to video technology/had technical difficulties with video requiring transitioning to audio format only (telephone).  All issues noted in this document were discussed and addressed.  No physical exam could be performed with this format.  Please refer to the patient's chart for his  consent to telehealth for [facility].   Date:  09/23/2019   ID:  [name], DOB 1932-03-25, MRN [id number]  Patient Location: Home Provider Location: Home  PCP:  Tonia Ghent, MD  Cardiologist:  Quay Burow, MD  Electrophysiologist:  None   Evaluation Performed:  Follow-Up Visit  Chief Complaint:  CHF, CAD  History of Present Illness:    [name] is a 85 y.o. male with hypertensive heart disease, atrial flutter/PAF on 2.5 mg eliquis BID, carotid artery disease s/p RCEA 2004, CAD s/p CABG in 1996 with redo in 2006, HLD, DM, chronic systolic and diastolic heart failure, pulmonary hypertension, chronic RBBB, DM, and CKD stage IV. Heart cath in 2015 for [place] revealed high grade OM branch vein graft stenosis treated with DES. Recurrent chest pain lead to repeat heart cath and intervention just proximal to recent stent. He was deemed a plavix non-responder and started on brilinta. He had a GI bleed on coumadin. NSTEMI following EGD and colonoscopy for bleeds. Echo with new anterior wall motion abnormality. However, he was not able to take antiplatelet therapy at the time, in addition to CKD, and angiography was not  pursued. Chronic GI bleeding led to left-sided hemicolectomy and sigmoidectomy. Myoview stress test for CP and dyspnea was abnormal with new wall motion abnormality on echo 06/2018 --> heart cath by Dr. Gwenlyn Found revealed occluded SVG-RCA with left-to-right collaterals with progression of the proximal Cx and OM branch stenosis. Dr. Gwenlyn Found was unable to cross the OM branch ostium and recommended aggressive medical therapy. FU appt with Kerin Ransom with increasing dyspnea. Long-acting nitrate was started. He was in Afib CVR. He was last seen in clinic by Dr. Gwenlyn Found on 06/21/19. Due to progressive dyspnea and mild to moderate MR, follow up echocardiogram was ordered and revealed reduced EF to 40-45%. Dr. Gwenlyn Found recommended referral to AHF clinic.   He presents today for follow up. He states he never got a call for cardiac rehab. He denies resting dyspnea, but does report aching in his legs, knees, and hips after sitting for a period of time. His legs feel very heavy. He reports swelling in his ankles. A provider has advised that he take 80 mg lasix in the morning and 40 mg in the afternoon. He generally forgets the afternoon lasix. He eats a Consulting civil engineer sausage egg and cheese biscuits every morning. I asked him to take the sausage off the biscuit. Overall he is doing well. He does not want to see AHF clinic at this time. He is walking his driveway. He can walk up 8 steps, but is very tired after 4 steps and must pause. He thinks this is due to his CAD and his fluid retention. He denies orthopnea.  The patient does not have symptoms concerning for COVID-19 infection (fever, chills, cough, or new shortness of breath).     Past Medical History:  Diagnosis Date   Age-related macular degeneration, wet, both eyes ([place])    Anemia    Secondary to acute blood loss   Anginal pain ([name])    Arthritis    "mild in hands, knees, ankles" (11/13/2015)   Atrial fibrillation ([place])    Consideration was given for atrial  flutter ablation, but patient developed atrial fibrillation. Cardioversion was done. Dr. Caryl Comes decided to watch him clinically. November, 2011   Atrial flutter [facility]) 07/2010   September, 2011   Hospital with PNA and cath done.Marland KitchenMarland KitchenCoumadin.  Atrial flutter ablation planned, but  pt. then had atrial fibrillation,/outpatient conversion 09/08/10..NSR..plan to follow..Dr. Caryl Comes   CAD (coronary artery disease)    Catheterization, September, 2011,  grafts patent from redo CABG,, medical therapy of coronary disease, consideration to proceeding with atrial flutter ablation   Carotid artery disease ([place])    Doppler 09/18/2009 - 49% bilateral stenoses   Carotid artery disease ([name])    49% bilateral, Doppler, November, 2010   Chronic kidney disease (CKD), stage III (moderate)    Diabetic peripheral neuropathy ([place])    Diverticulosis of colon with hemorrhage 2009   several unit diverticular bleed    Ejection fraction    EF 60%, echo, 2009  //   EF 65%, echo, September, 2011   Erectile dysfunction    Mild   Gout    History of blood transfusion "several"   related to diverticular bleeding   Hyperlipidemia    Hypertension    Mitral regurgitation    Mild, echo, September, 2011   NSTEMI (non-ST elevated myocardial infarction) ([place]) 07/2010   at [facility] with repeat cath, rec medical mgmt    OSA on CPAP    Personal history of colonic polyps    PNA (pneumonia) 9/11   NSTEMI at [facility] with repeat cath, rec medical mgmt    RBBB (right bundle branch block)    Shoulder pain    "positional; better now" (11/13/2015)   Skin cancer    R lower leg, per derm 2012   Type II diabetes mellitus ([place])    Past Surgical History:  Procedure Laterality Date   CARDIAC CATHETERIZATION  2006   Nuclear..slight lateral ischemia..medical therapy   CARDIAC CATHETERIZATION  08/04/2010   grafts patent from redo CABG...medical Rx and ablate Atrial flutter (LV not injected)    CARDIOVERSION  ~ 2010   CAROTID  ENDARTERECTOMY Right 1994   CATARACT EXTRACTION W/ INTRAOCULAR LENS  IMPLANT, BILATERAL Bilateral    COLON RESECTION N/A 01/13/2016   Procedure: EXPLORATORY LAPAROTOMY, LEFT AND SIGMOID COLON REMOVAL;  Surgeon: Ralene Ok, MD;  Location: [place];  Service: General;  Laterality: N/A;  Extended open left hemicolectomy and sigmoidectomy    COLONOSCOPY N/A 11/14/2015   Procedure: COLONOSCOPY;  Surgeon: Manus Gunning, MD;  Location: [place];  Service: Gastroenterology;  Laterality: N/A;   COLONOSCOPY Left 01/05/2016   Procedure: COLONOSCOPY;  Surgeon: Manus Gunning, MD;  Location: [place];  Service: Gastroenterology;  Laterality: Left;  no sedation to start, moderate if needed   COLONOSCOPY W/ POLYPECTOMY     CORONARY ARTERY BYPASS GRAFT  1995; 2006   "X 3; X3"   [place], 2006   DOPPLER ECHOCARDIOGRAPHY  08/2008   EF 60%   DOPPLER ECHOCARDIOGRAPHY  08/02/2010   65-70%   DOPPLER ECHOCARDIOGRAPHY  07/2010   MR mild   ESOPHAGOGASTRODUODENOSCOPY N/A 06/19/2014   Procedure: ESOPHAGOGASTRODUODENOSCOPY (EGD);  Surgeon: Jerene Bears, MD;  Location: [facility] ENDOSCOPY;  Service: Endoscopy;  Laterality: N/A;   ESOPHAGOGASTRODUODENOSCOPY N/A 11/14/2015   Procedure: ESOPHAGOGASTRODUODENOSCOPY (EGD);  Surgeon: Manus Gunning, MD;  Location: [place];  Service: Gastroenterology;  Laterality: N/A;   FLEXIBLE SIGMOIDOSCOPY N/A 11/16/2015   Procedure: FLEXIBLE SIGMOIDOSCOPY;  Surgeon: Manus Gunning, MD;  Location[facility]COPY;  Service: Gastroenterology;  Laterality: N/A;   LAPAROSCOPIC CHOLECYSTECTOMY  2008   LEFT HEART CATH N/A 06/14/2014   Procedure: LEFT HEART CATH;  Surgeon: Sinclair Grooms, MD;  Location: [facility] CATH LAB;  Service: Cardiovascular;  Laterality: N/A;   LEFT HEART CATH AND CORONARY ANGIOGRAPHY N/A 08/02/2018   Procedure: LEFT HEART CATH AND CORONARY ANGIOGRAPHY;  Surgeon: [name], MD;  Location: [place] CV LAB;  Service: Cardiovascular;  Laterality: N/A;   LEFT HEART CATHETERIZATION WITH CORONARY/GRAFT ANGIOGRAM N/A 06/11/2014   Procedure: LEFT HEART CATHETERIZATION WITH Beatrix Fetters;  Surgeon: Sinclair Grooms, MD;  Location: [facility] CATH LAB;  Service: Cardiovascular;  Laterality: N/A;   PERCUTANEOUS CORONARY STENT INTERVENTION (PCI-S) N/A 06/13/2014   Procedure: PERCUTANEOUS CORONARY STENT INTERVENTION (PCI-S);  Surgeon: Sinclair Grooms, MD;  Location: [facility] CATH LAB;  Service: Cardiovascular;  Laterality: N/A;   SKIN CANCER EXCISION Left 10/2015   calf   SKIN CANCER EXCISION Right 2014?   chest   TONSILLECTOMY     VASECTOMY       Current Meds  Medication Sig   apixaban (ELIQUIS) 2.5 MG TABS tablet Take 1 tablet (2.5 mg total) by mouth 2 (two) times daily.   aspirin 81 MG tablet Take 81 mg by mouth daily.   BD INSULIN SYRINGE ULTRAFINE 31G X 5/16" 0.3 ML MISC USE DAILY AS INSTRUCTED   Cholecalciferol (VITAMIN D) 1000 UNITS capsule Take 1,000 Units by mouth 2 (two) times a day.    colchicine 0.6 MG tablet Take 1 tablet (0.6 mg total) by mouth daily as needed (Fri, Sat, Sun, Mon).   fish oil-omega-3 fatty acids 1000 MG capsule Take 1 g by mouth daily.    furosemide (LASIX) 80 MG tablet Take one 80 mg tablet in the morning. Take HALF tablet 40mg  in the afternoon if needed.   glucose blood (FREESTYLE LITE) test strip USE TO TEST BLOOD SUGAR ONCE DAILY AND AS NEEDED FOR DIABETES MELLITUS   isosorbide mononitrate (IMDUR) 60 MG 24 hr tablet Take 90mg  in the am and 60mg  in the pm   LANTUS 100 UNIT/ML injection INJECT 20 TO 25 UNITS AT BEDTIME   magnesium oxide (MAG-OX) 400 MG tablet Take 400 mg by mouth daily.    metoprolol tartrate (LOPRESSOR) 50 MG tablet TAKE 1 TABLET TWICE A DAY   Multiple Vitamin (MULTIVITAMIN WITH MINERALS) TABS Take 1 tablet by mouth daily.   nitroGLYCERIN (NITROSTAT) 0.4 MG SL tablet Place 1 tablet (0.4 mg total) under the tongue  every 5 (five) minutes as needed for chest pain.   Potassium Gluconate (K-99) 595 MG CAPS Take 595 mg by mouth daily.    ranolazine (RANEXA) 500 MG 12 hr tablet TAKE 1 TABLET TWICE A DAY   rosuvastatin (CRESTOR) 40 MG tablet Take 1 tablet (40 mg total) by mouth at bedtime.     Allergies:   Patient has no known allergies.   Social History   Tobacco Use   Smoking status: Former Smoker    Packs/day: 1.00  Years: 8.00    Pack years: 8.00    Types: Cigarettes   Smokeless tobacco: Never Used   Tobacco comment: "quit smoking cigarettes in 1958"  Substance Use Topics   Alcohol use: No    Alcohol/week: 0.0 standard drinks   Drug use: No     Family Hx: The patient's family history includes Arthritis in his sister; Cancer in his sister; Diabetes in his mother and sister; Heart disease in his father and sister; Kidney disease in his mother; Stroke in his mother. There is no history of Prostate cancer or Colon cancer.  ROS:   Please see the history of present illness.     All other systems reviewed and are negative.   Prior CV studies:   The following studies were reviewed today:  Echo 06/25/19:  1. The left ventricle has mild-moderately reduced systolic function, with an ejection fraction of 40-45%. The cavity size was normal. Left ventricular diastolic function could not be evaluated secondary to atrial fibrillation. Elevated left ventricular  end-diastolic pressure.  2. There is akinesis of the mid and apical inferior walls and hypokinesis of the inferolateral walls consistent with prior infarct in the RCA or left Cx territory.  3. The right ventricle has moderately reduced systolic function. The cavity was normal. There is no increase in right ventricular wall thickness. Right ventricular systolic pressure is severely elevated at 87mmHg .  4. Left atrial size was mildly dilated.  5. Mild thickening of the mitral valve leaflet. Mild calcification of the mitral valve leaflet.  There is moderate mitral annular calcification present. Mitral valve regurgitation is moderate by color flow Doppler.  6. The aortic valve has an indeterminate number of cusps. Moderate thickening of the aortic valve. Severe calcifcation of the aortic valve. Aortic valve regurgitation is mild by color flow Doppler.  7. The inferior vena cava was normal in size with <50% respiratory variability.  8. The interatrial septum appears to be lipomatous.  9. The aorta is normal in size and structure.  Labs/Other Tests and Data Reviewed:    EKG:  An ECG dated 06/21/19 was personally reviewed today and demonstrated:  atiral fibrillation, ventricular rate 72, RBBB, LAFB  Recent Labs: 02/01/2019: TSH 3.89 03/21/2019: Hemoglobin 11.6; Platelets 137 06/21/2019: ALT 16; BUN 32; Creatinine, Ser 2.32; Potassium 4.3; Sodium 139   Recent Lipid Panel Lab Results  Component Value Date/Time   CHOL 102 06/21/2019 11:37 AM   TRIG 106.0 06/21/2019 11:37 AM   HDL 25.20 (L) 06/21/2019 11:37 AM   CHOLHDL 4 06/21/2019 11:37 AM   LDLCALC 56 06/21/2019 11:37 AM    Wt Readings from Last 3 Encounters:  09/23/19 197 lb (89.4 kg)  06/25/19 194 lb 6 oz (88.2 kg)  06/21/19 193 lb (87.5 kg)     Objective:    Vital Signs:  BP 125/63    Pulse 74    Ht 5\' 6"  (1.676 m)    Wt 197 lb (89.4 kg)    BMI 31.80 kg/m    VITAL SIGNS:  reviewed GEN:  no acute distress RESPIRATORY:  respirations unlabored NEURO:  alert and oriented x 3, no obvious focal deficit PSYCH:  normal affect  ASSESSMENT & PLAN:    CAD s/p CABG with redo, last heart cath 2019 recommended aggressive medical therapy - continue ASA, BB, imdur 90 mg in the AM and 60 mg in the PM, ranexa, and statin - he states he is at his baseline, he is able to walk his driveway, but must  rest when walking up steps   Moderate Mitral regurgitation - by echo 5374   Chronic systolic and diastolic heart failure - EF newly reduced to 40-45% - diuretic at home includes 80  mg lasix qAM and 40 mg in the afternoon - he generally forgets to take the afternoon dose - we discussed ways to reduce his salt intake by eating less sausage  - he denies orthopnea and dyspnea, but does report lower extremity swelling and leg heaviness   PAF - 2.5 mg eliquis BID for age and renal function - no bleeding - he has palpitations, but no sustained Afib, he is at his baseline - continue BB This patients CHA2DS2-VASc Score and unadjusted Ischemic Stroke Rate (% per year) is equal to 7.2 % stroke rate/year from a score of 5 (CHF, HTN, MI, age)   COVID-31 Education: The signs and symptoms of COVID-19 were discussed with the patient and how to seek care for testing (follow up with PCP or arrange E-visit).  The importance of social distancing was discussed today.  Time:   Today, I have spent 18 minutes with the patient with telehealth technology discussing the above problems.     Medication Adjustments/Labs and Tests Ordered: Current medicines are reviewed at length with the patient today.  Concerns regarding medicines are outlined above.   Tests Ordered: No orders of the defined types were placed in this encounter.   Medication Changes: No orders of the defined types were placed in this encounter.   Follow Up:  Either In Person or Virtual in 6 month(s)  Signed, Ledora Bottcher, [place]  09/23/2019 11:00 AM    [place]

## 2019-09-23 ENCOUNTER — Encounter: Payer: Self-pay | Admitting: Physician Assistant

## 2019-09-23 ENCOUNTER — Telehealth (INDEPENDENT_AMBULATORY_CARE_PROVIDER_SITE_OTHER): Payer: Medicare Other | Admitting: Physician Assistant

## 2019-09-23 VITALS — BP 125/63 | HR 74 | Ht 66.0 in | Wt 197.0 lb

## 2019-09-23 DIAGNOSIS — I340 Nonrheumatic mitral (valve) insufficiency: Secondary | ICD-10-CM

## 2019-09-23 DIAGNOSIS — I25118 Atherosclerotic heart disease of native coronary artery with other forms of angina pectoris: Secondary | ICD-10-CM

## 2019-09-23 DIAGNOSIS — I480 Paroxysmal atrial fibrillation: Secondary | ICD-10-CM

## 2019-09-23 DIAGNOSIS — I5042 Chronic combined systolic (congestive) and diastolic (congestive) heart failure: Secondary | ICD-10-CM

## 2019-09-23 DIAGNOSIS — E782 Mixed hyperlipidemia: Secondary | ICD-10-CM

## 2019-09-23 DIAGNOSIS — I119 Hypertensive heart disease without heart failure: Secondary | ICD-10-CM

## 2019-09-23 NOTE — Patient Instructions (Signed)
Medication Instructions:  Your physician recommends that you continue on your current medications as directed. Please refer to the Current Medication list given to you today.  *If you need a refill on your cardiac medications before your next appointment, please call your pharmacy*   Follow-Up: At [facility], you and your health needs are our priority.  As part of our continuing mission to provide you with exceptional heart care, we have created designated Provider Care Teams.  These Care Teams include your primary Cardiologist (physician) and Advanced Practice Providers (APPs -  Physician Assistants and Nurse Practitioners) who all work together to provide you with the care you need, when you need it.  Your next appointment:   6 months  The format for your next appointment:   In Person  Provider:   You may see Quay Burow, MD or one of the following Advanced Practice Providers on your designated Care Team:    Kerin Ransom, PA-C  [place], [place]  Coletta Memos, [place]   Other Instructions Please call our office 2 months in advance to schedule your follow-up appointment.

## 2019-12-09 ENCOUNTER — Other Ambulatory Visit: Payer: Self-pay

## 2019-12-09 ENCOUNTER — Ambulatory Visit (HOSPITAL_COMMUNITY)
Admission: RE | Admit: 2019-12-09 | Discharge: 2019-12-09 | Disposition: A | Discharge status: Home / Self Care | Payer: Medicare Other | Source: Ambulatory Visit | Attending: Internal Medicine | Admitting: Internal Medicine

## 2019-12-09 ENCOUNTER — Encounter (HOSPITAL_COMMUNITY): Payer: Self-pay | Admitting: Internal Medicine

## 2019-12-09 VITALS — BP 156/69 | HR 65 | Wt 190.8 lb

## 2019-12-09 DIAGNOSIS — R531 Weakness: Secondary | ICD-10-CM | POA: Diagnosis not present

## 2019-12-09 DIAGNOSIS — Z79899 Other long term (current) drug therapy: Secondary | ICD-10-CM | POA: Diagnosis not present

## 2019-12-09 DIAGNOSIS — I119 Hypertensive heart disease without heart failure: Secondary | ICD-10-CM | POA: Diagnosis not present

## 2019-12-09 DIAGNOSIS — I272 Pulmonary hypertension, unspecified: Secondary | ICD-10-CM | POA: Diagnosis not present

## 2019-12-09 DIAGNOSIS — R0602 Shortness of breath: Secondary | ICD-10-CM | POA: Diagnosis not present

## 2019-12-09 DIAGNOSIS — I2510 Atherosclerotic heart disease of native coronary artery without angina pectoris: Secondary | ICD-10-CM | POA: Diagnosis not present

## 2019-12-09 DIAGNOSIS — N184 Chronic kidney disease, stage 4 (severe): Secondary | ICD-10-CM | POA: Diagnosis not present

## 2019-12-09 DIAGNOSIS — Z7982 Long term (current) use of aspirin: Secondary | ICD-10-CM | POA: Insufficient documentation

## 2019-12-09 DIAGNOSIS — I080 Rheumatic disorders of both mitral and aortic valves: Secondary | ICD-10-CM | POA: Diagnosis not present

## 2019-12-09 DIAGNOSIS — E1142 Type 2 diabetes mellitus with diabetic polyneuropathy: Secondary | ICD-10-CM | POA: Diagnosis not present

## 2019-12-09 DIAGNOSIS — M1990 Unspecified osteoarthritis, unspecified site: Secondary | ICD-10-CM | POA: Diagnosis not present

## 2019-12-09 DIAGNOSIS — E785 Hyperlipidemia, unspecified: Secondary | ICD-10-CM | POA: Diagnosis not present

## 2019-12-09 DIAGNOSIS — I255 Ischemic cardiomyopathy: Secondary | ICD-10-CM | POA: Diagnosis not present

## 2019-12-09 DIAGNOSIS — I5042 Chronic combined systolic (congestive) and diastolic (congestive) heart failure: Secondary | ICD-10-CM | POA: Diagnosis not present

## 2019-12-09 DIAGNOSIS — Z794 Long term (current) use of insulin: Secondary | ICD-10-CM | POA: Diagnosis not present

## 2019-12-09 DIAGNOSIS — G4733 Obstructive sleep apnea (adult) (pediatric): Secondary | ICD-10-CM | POA: Insufficient documentation

## 2019-12-09 DIAGNOSIS — I252 Old myocardial infarction: Secondary | ICD-10-CM | POA: Diagnosis not present

## 2019-12-09 DIAGNOSIS — I25810 Atherosclerosis of coronary artery bypass graft(s) without angina pectoris: Secondary | ICD-10-CM | POA: Insufficient documentation

## 2019-12-09 DIAGNOSIS — E1122 Type 2 diabetes mellitus with diabetic chronic kidney disease: Secondary | ICD-10-CM | POA: Insufficient documentation

## 2019-12-09 DIAGNOSIS — M109 Gout, unspecified: Secondary | ICD-10-CM | POA: Diagnosis not present

## 2019-12-09 DIAGNOSIS — I5022 Chronic systolic (congestive) heart failure: Secondary | ICD-10-CM | POA: Insufficient documentation

## 2019-12-09 DIAGNOSIS — I340 Nonrheumatic mitral (valve) insufficiency: Secondary | ICD-10-CM

## 2019-12-09 DIAGNOSIS — I130 Hypertensive heart and chronic kidney disease with heart failure and stage 1 through stage 4 chronic kidney disease, or unspecified chronic kidney disease: Secondary | ICD-10-CM | POA: Diagnosis not present

## 2019-12-09 DIAGNOSIS — Z7901 Long term (current) use of anticoagulants: Secondary | ICD-10-CM | POA: Insufficient documentation

## 2019-12-09 DIAGNOSIS — Z87891 Personal history of nicotine dependence: Secondary | ICD-10-CM | POA: Insufficient documentation

## 2019-12-09 DIAGNOSIS — I482 Chronic atrial fibrillation, unspecified: Secondary | ICD-10-CM | POA: Insufficient documentation

## 2019-12-09 LAB — BASIC METABOLIC PANEL
Anion gap: 11 (ref 5–15)
BUN: 37 mg/dL — ABNORMAL HIGH (ref 8–23)
CO2: 29 mmol/L (ref 22–32)
Calcium: 9.6 mg/dL (ref 8.9–10.3)
Chloride: 98 mmol/L (ref 98–111)
Creatinine, Ser: 2.31 mg/dL — ABNORMAL HIGH (ref 0.61–1.24)
GFR calc Af Amer: 29 mL/min — ABNORMAL LOW (ref >60)
GFR calc non Af Amer: 25 mL/min — ABNORMAL LOW (ref >60)
Glucose, Bld: 332 mg/dL — ABNORMAL HIGH (ref 70–99)
Potassium: 4.1 mmol/L (ref 3.5–5.1)
Sodium: 138 mmol/L (ref 135–145)

## 2019-12-09 LAB — CBC
HCT: 39.1 % (ref 39.0–52.0)
Hemoglobin: 12.2 g/dL — ABNORMAL LOW (ref 13.0–17.0)
MCH: 30.5 pg (ref 26.0–34.0)
MCHC: 31.2 g/dL (ref 30.0–36.0)
MCV: 97.8 fL (ref 80.0–100.0)
Platelets: 128 10*3/uL — ABNORMAL LOW (ref 150–400)
RBC: 4 MIL/uL — ABNORMAL LOW (ref 4.22–5.81)
RDW: 14.9 % (ref 11.5–15.5)
WBC: 6.4 10*3/uL (ref 4.0–10.5)
nRBC: 0 % (ref 0.0–0.2)

## 2019-12-09 LAB — BRAIN NATRIURETIC PEPTIDE: B Natriuretic Peptide: 468.9 pg/mL — ABNORMAL HIGH (ref 0.0–100.0)

## 2019-12-09 MED ORDER — TORSEMIDE 20 MG PO TABS: 40.0000 mg | ORAL_TABLET | Freq: Two times a day (BID) | ORAL | 3 refills | Status: DC

## 2019-12-09 MED ORDER — HYDRALAZINE HCL 25 MG PO TABS: 25.0000 mg | ORAL_TABLET | Freq: Two times a day (BID) | ORAL | 3 refills | Status: DC

## 2019-12-09 MED ORDER — FUROSEMIDE 80 MG PO TABS: ORAL_TABLET | ORAL | 3 refills | Status: DC

## 2019-12-09 NOTE — Progress Notes (Signed)
ADVANCED HF CLINIC CONSULT NOTE  Referring Physician: Gwenlyn Found Primary Care: Primary Cardiologist: Gwenlyn Found  HPI:  [name] is an 86 y.o. male with h/o CAD s/p re-do CABG ([id number]), carotid disease s/p CEA, HTN, HLN, chronic AF, chronic GI bleeding s/p left hemicolectomy. He is referred by Dr. Gwenlyn Found for evaluation of ongoing dyspnea.   He has had several caths over the past 5 years with stenting of SVG to OM on 2015. Last cath 9/19 newly occluded SVG to RCA and high-grade OM1 lesion which was treated medically due to unfavorable anatomy.   Says over past 2 years has noted a significant decrease in his functional capacity. Can do ADLs without problems. Now gets SOB and legs weak when going to the mailbox. No stamina. Mild dependent edema which resolves overnight. Takes lasix 80 every day and rarely takes 40 in the afternon because he forgets about it. Chest gets tight when he climbs stairs. Weight stable at 190. About 3-4 months ago was walking around his yard but now can't do 1 trip. Says he doesn't take as good as care of himself as he should but he is trying.   Echo 8/20  1. LVEF 40-45% akinesis of the mid and apical inferior walls and hypokinesis of the inferolateral walls consistent with prior infarct in the RCA or left Cx territory.  2. The right ventricle has moderately reduced systolic function. The cavity was normal. There is no increase in right ventricular wall thickness. Right ventricular systolic pressure is severely elevated at 9mmHg .  3. Moderate MR  4. The aortic valve has an indeterminate number of cusps. Moderate thickening of the aortic valve. Severe calcifcation of the aortic valve. Aortic valve regurgitation is mild by color flow Doppler.   Review of Systems: [y] = yes, [ ]  = no   General: Weight gain [ ] ; Weight loss [ ] ; Anorexia [ ] ; Fatigue [ y]; Fever [ ] ; Chills [ ] ; Weakness Blue.Reese ]  Cardiac: Chest pain/pressure Blue.Reese ]; Resting SOB [ ] ; Exertional SOB Blue.Reese ];  Orthopnea [ y]; Pedal Edema Blue.Reese ]; Palpitations [ ] ; Syncope [ ] ; Presyncope [ ] ; Paroxysmal nocturnal dyspnea[ y]  Pulmonary: Cough [ ] ; Wheezing[ ] ; Hemoptysis[ ] ; Sputum [ ] ; Snoring [ ]   GI: Vomiting[ ] ; Dysphagia[ ] ; Melena[ ] ; Hematochezia [ ] ; Heartburn[ ] ; Abdominal pain [ ] ; Constipation [ ] ; Diarrhea [ ] ; BRBPR [ ]   GU: Hematuria[ ] ; Dysuria [ ] ; Nocturia[ ]   Vascular: Pain in legs with walking [ ] ; Pain in feet with lying flat [ ] ; Non-healing sores [ ] ; Stroke [ ] ; TIA [ ] ; Slurred speech [ ] ;  Neuro: Headaches[ ] ; Vertigo[ ] ; Seizures[ ] ; Paresthesias[ ] ;Blurred vision [ ] ; Diplopia [ ] ; Vision changes [ ]   Ortho/Skin: Arthritis Blue.Reese ]; Joint pain Blue.Reese ]; Muscle pain [ ] ; Joint swelling [ ] ; Back Pain [ ] ; Rash [ ]   Psych: Depression[ ] ; Anxiety[ ]   Heme: Bleeding problems [ ] ; Clotting disorders [ ] ; Anemia [ y]  Endocrine: Diabetes Blue.Reese ]; Thyroid dysfunction[ ]    Past Medical History:  Diagnosis Date  . Age-related macular degeneration, wet, both eyes ([place])   . Anemia    Secondary to acute blood loss  . Anginal pain ([place])   . Arthritis    "mild in hands, knees, ankles" (11/13/2015)  . Atrial fibrillation ([place])    Consideration was given for atrial flutter ablation, but patient developed atrial fibrillation. Cardioversion was done. Dr. Caryl Comes decided to watch  him clinically. November, 2011  . Atrial flutter [facility]) 07/2010   September, 2011   Hospital with PNA and cath done.Marland KitchenMarland KitchenCoumadin.  Atrial flutter ablation planned, but  pt. then had atrial fibrillation,/outpatient conversion 09/08/10..NSR..plan to follow..Dr. Caryl Comes  . CAD (coronary artery disease)    Catheterization, September, 2011,  grafts patent from redo CABG,, medical therapy of coronary disease, consideration to proceeding with atrial flutter ablation  . Carotid artery disease ([place])    Doppler 09/18/2009 - 49% bilateral stenoses  . Carotid artery disease ([name])    49% bilateral, Doppler, November, 2010  . Chronic kidney  disease (CKD), stage III (moderate)   . Diabetic peripheral neuropathy ([place])   . Diverticulosis of colon with hemorrhage 2009   several unit diverticular bleed   . Ejection fraction    EF 60%, echo, 2009  //   EF 65%, echo, September, 2011  . Erectile dysfunction    Mild  . Gout   . History of blood transfusion "several"   related to diverticular bleeding  . Hyperlipidemia   . Hypertension   . Mitral regurgitation    Mild, echo, September, 2011  . NSTEMI (non-ST elevated myocardial infarction) ([place]) 07/2010   at [facility] with repeat cath, rec medical mgmt   . OSA on CPAP   . Personal history of colonic polyps   . PNA (pneumonia) 9/11   NSTEMI at [facility] with repeat cath, rec medical mgmt   . RBBB (right bundle branch block)   . Shoulder pain    "positional; better now" (11/13/2015)  . Skin cancer    R lower leg, per derm 2012  . Type II diabetes mellitus ([place])     Current Outpatient Medications  Medication Sig Dispense Refill  . apixaban (ELIQUIS) 2.5 MG TABS tablet Take 1 tablet (2.5 mg total) by mouth 2 (two) times daily. 180 tablet 1  . aspirin 81 MG tablet Take 81 mg by mouth daily.    . BD INSULIN SYRINGE ULTRAFINE 31G X 5/16" 0.3 ML MISC USE DAILY AS INSTRUCTED 100 each 2  . Cholecalciferol (VITAMIN D) 1000 UNITS capsule Take 1,000 Units by mouth 2 (two) times a day.     . colchicine 0.6 MG tablet Take 1 tablet (0.6 mg total) by mouth daily as needed (Fri, Sat, Sun, Mon).    . fish oil-omega-3 fatty acids 1000 MG capsule Take 1 g by mouth daily.     . furosemide (LASIX) 80 MG tablet Take one 80 mg tablet in the morning. Take HALF tablet 40mg  in the afternoon if needed.    [name] glucose blood (FREESTYLE LITE) test strip USE TO TEST BLOOD SUGAR ONCE DAILY AND AS NEEDED FOR DIABETES MELLITUS 300 each 3  . isosorbide mononitrate (IMDUR) 60 MG 24 hr tablet TAKE ONE AND ONE-HALF TABLETS (90 MG) IN THE MORNING AND 1 TABLET (60 MG) IN THE EVENING  (NEW INSTRUCTIONS) 270 tablet 3  . LANTUS 100  UNIT/ML injection INJECT 20 TO 25 UNITS AT BEDTIME 40 mL 3  . magnesium oxide (MAG-OX) 400 MG tablet Take 400 mg by mouth daily.     . metoprolol tartrate (LOPRESSOR) 50 MG tablet TAKE 1 TABLET TWICE A DAY 180 tablet 1  . Multiple Vitamin (MULTIVITAMIN WITH MINERALS) TABS Take 1 tablet by mouth daily.    . nitroGLYCERIN (NITROSTAT) 0.4 MG SL tablet Place 1 tablet (0.4 mg total) under the tongue every 5 (five) minutes as needed for chest pain. 100 tablet 1  . Potassium Gluconate (K-99)  595 MG CAPS Take 595 mg by mouth daily.     . ranolazine (RANEXA) 500 MG 12 hr tablet TAKE 1 TABLET TWICE A DAY 180 tablet 2  . rosuvastatin (CRESTOR) 40 MG tablet Take 1 tablet (40 mg total) by mouth at bedtime. 90 tablet 3  . traMADol (ULTRAM) 50 MG tablet Take 50 mg by mouth every 6 (six) hours as needed for moderate pain.     No current facility-administered medications for this encounter.    No Known Allergies    Social History   Socioeconomic History  . Marital status: Widowed    Spouse name: Not on file  . Number of children: 2  . Years of education: Not on file  . Highest education level: Not on file  Occupational History  . 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 Smoking status: Former Smoker    Packs/day: 1.00    Years: 8.00    Pack years: 8.00    Types: Cigarettes  . Smokeless tobacco: Never Used  . Tobacco comment: "quit smoking cigarettes in 1958"  Substance and Sexual Activity  . Alcohol use: No    Alcohol/week: 0.0 standard drinks  . Drug use: No  . Sexual activity: Yes  Other Topics Concern  . Not on file  Social History Narrative   From [place].  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     Lives with girlfriend [name].  Widowed 12/2005.   Social Determinants of Health   Financial Resource Strain: Low Risk   . Difficulty of Paying Living Expenses: Not hard at all  Food Insecurity: No Food  Insecurity  . Worried About Charity fundraiser in the Last Year: Never true  . Ran Out of Food in the Last Year: Never true  Transportation Needs: No Transportation Needs  . Lack of Transportation (Medical): No  . Lack of Transportation (Non-Medical): No  Physical Activity: Inactive  . Days of Exercise per Week: 0 days  . Minutes of Exercise per Session: 0 min  Stress: No Stress Concern Present  . Feeling of Stress : Only a little  Social Connections:   . Frequency of Communication with Friends and Family: Not on file  . Frequency of Social Gatherings with Friends and Family: Not on file  . Attends Religious Services: Not on file  . Active Member of Clubs or Organizations: Not on file  . Attends Archivist Meetings: Not on file  . Marital Status: Not on file  Intimate Partner Violence: Not At Risk  . Fear of Current or Ex-Partner: No  . Emotionally Abused: No  . Physically Abused: No  . Sexually Abused: No      Family History  Problem Relation Age of Onset  . Kidney disease Mother        Kidney failure  . Stroke Mother   . Diabetes Mother   . Heart disease Father        MI  . Arthritis Sister   . Cancer Sister        Throat  . Heart disease Sister        MI  . Diabetes Sister   . Prostate cancer Neg Hx   . Colon cancer Neg Hx     Vitals:   12/09/19 1158  BP: (!) 156/69  Pulse: 65  SpO2: 94%  Weight: 86.5 kg (190 lb 12.8 oz)  PHYSICAL EXAM: General:  Elderly. No respiratory difficulty HEENT: normal Neck: supple. JVP 7-8. Carotids 2+ bilat; no bruits. No lymphadenopathy or thryomegaly appreciated. Cor: PMI nondisplaced. Irregular rate & rhythm. 3/6 MR, 2/6 TR Lungs: clear Abdomen: soft, nontender, nondistended. No hepatosplenomegaly. No bruits or masses. Good bowel sounds. Extremities: no cyanosis, clubbing, rash, 1+ edema Neuro: alert & oriented x 3, cranial nerves grossly intact. moves all 4 extremities w/o difficulty. Affect pleasant.  ECG:  AF 68 RBBB/LAFB Personally reviewed  ReDS 32%  ASSESSMENT & PLAN:  1. Dyspnea/chronic systolic HF due to ischemic CM  - EF 40-45% echo 8/20 3+ MR - he has had progressive deterioration in his functional capacity over the past few months. Now NYHA IIIb - suspect multifactorial but in reviewing hie echo I think his MR is quite severe and likely a major factor.  - on exam I felt he was volume overloaded but ReDS at upper end of normal range 32% (20-35%). I was initially going to switch lasix to torsemide but given ReDS reading will not change lasix) keep at 80/40) - discussed possibility of MitraClip at length with him and his wife and suggested RHC and TEE as next steps. He is considering but wants to think about it first. I will see him back in a few weeks to re-discuss and plan formal referral to [facility] at that time - I reviewed studies with Dr. Burt Knack who agrees he may be candidate for Clip - GDMT limited by CKD 4  2. Mitral regurgitation - likely ischemic in nature. At least 3+ by TTE (may be worse) - plan as above - add hydralazine 25 bid for BP control and afterload reduction  3. HTN - BP high. With CKD 4 will use hydralazine as above  4. CAD s/p re-do CABG - recent cath with significant CAD but not amenable to PCI - continue medical management per Dr. Gwenlyn Found  5. Chronic AF - by ECG dates back to at least 7/18. Likely no role for DC-CV here - continue apixaban  Glori Bickers, MD  12:31 PM

## 2019-12-09 NOTE — Progress Notes (Signed)
Per Dr Bensimhon's order Torsmide rx was sent to pharmacy, however once ReDS vest reading was done he decided to just leave pt on his Furosemide 80 mg in AM and 40 mg in PM.  [facility]ed [facility] and gave order to D/C the order for Torsemide as pt will continue Furosemide.

## 2019-12-09 NOTE — Patient Instructions (Addendum)
Continue Furosemide 80 mg (1 tab) in AM and 40 mg (1/2 tab) in PM   Start Hydralazine 25 mg Twice daily   Labs done today, we will notify you of abnormal results  Your physician recommends that you schedule a follow-up appointment in: 3-4 weeks  If you have any questions or concerns before your next appointment please send [place] a message through [place] or call our office at [phone].  At the [facility], you and your health needs are our priority. As part of our continuing mission to provide you with exceptional heart care, we have created designated Provider Care Teams. These Care Teams include your primary Cardiologist (physician) and Advanced Practice Providers (APPs- Physician Assistants and Nurse Practitioners) who all work together to provide you with the care you need, when you need it.   You may see any of the following providers on your designated Care Team at your next follow up: [name] Dr Glori Bickers . Dr Loralie Champagne . Darrick Grinder, NP . Lyda Jester, PA . Audry Riles, PharmD   Please be sure to bring in all your medications bottles to every appointment.

## 2019-12-24 ENCOUNTER — Other Ambulatory Visit: Payer: Self-pay | Admitting: *Deleted

## 2019-12-24 ENCOUNTER — Other Ambulatory Visit: Payer: Self-pay | Admitting: Cardiovascular Disease

## 2019-12-24 ENCOUNTER — Other Ambulatory Visit: Payer: Self-pay

## 2019-12-24 ENCOUNTER — Ambulatory Visit (HOSPITAL_COMMUNITY)
Admission: RE | Admit: 2019-12-24 | Discharge: 2019-12-24 | Disposition: A | Discharge status: Home / Self Care | Payer: Medicare Other | Source: Ambulatory Visit | Attending: Internal Medicine | Admitting: Internal Medicine

## 2019-12-24 DIAGNOSIS — I6523 Occlusion and stenosis of bilateral carotid arteries: Secondary | ICD-10-CM | POA: Diagnosis not present

## 2020-01-01 ENCOUNTER — Ambulatory Visit (HOSPITAL_COMMUNITY)
Admission: RE | Admit: 2020-01-01 | Discharge: 2020-01-01 | Disposition: A | Discharge status: Home / Self Care | Payer: Medicare Other | Source: Ambulatory Visit | Attending: Internal Medicine | Admitting: Internal Medicine

## 2020-01-01 ENCOUNTER — Other Ambulatory Visit (HOSPITAL_COMMUNITY): Payer: Self-pay

## 2020-01-01 ENCOUNTER — Other Ambulatory Visit: Payer: Self-pay

## 2020-01-01 ENCOUNTER — Encounter (HOSPITAL_COMMUNITY): Payer: Self-pay | Admitting: Internal Medicine

## 2020-01-01 VITALS — BP 122/70 | HR 65 | Wt 188.4 lb

## 2020-01-01 DIAGNOSIS — M1389 Other specified arthritis, multiple sites: Secondary | ICD-10-CM | POA: Diagnosis not present

## 2020-01-01 DIAGNOSIS — I5022 Chronic systolic (congestive) heart failure: Secondary | ICD-10-CM | POA: Diagnosis not present

## 2020-01-01 DIAGNOSIS — Z794 Long term (current) use of insulin: Secondary | ICD-10-CM | POA: Insufficient documentation

## 2020-01-01 DIAGNOSIS — Z841 Family history of disorders of kidney and ureter: Secondary | ICD-10-CM | POA: Insufficient documentation

## 2020-01-01 DIAGNOSIS — R0600 Dyspnea, unspecified: Secondary | ICD-10-CM | POA: Diagnosis not present

## 2020-01-01 DIAGNOSIS — Z8249 Family history of ischemic heart disease and other diseases of the circulatory system: Secondary | ICD-10-CM | POA: Insufficient documentation

## 2020-01-01 DIAGNOSIS — Z951 Presence of aortocoronary bypass graft: Secondary | ICD-10-CM | POA: Insufficient documentation

## 2020-01-01 DIAGNOSIS — G4733 Obstructive sleep apnea (adult) (pediatric): Secondary | ICD-10-CM | POA: Insufficient documentation

## 2020-01-01 DIAGNOSIS — Z7901 Long term (current) use of anticoagulants: Secondary | ICD-10-CM | POA: Diagnosis not present

## 2020-01-01 DIAGNOSIS — Z800 Family history of malignant neoplasm of digestive organs: Secondary | ICD-10-CM | POA: Insufficient documentation

## 2020-01-01 DIAGNOSIS — Z833 Family history of diabetes mellitus: Secondary | ICD-10-CM | POA: Insufficient documentation

## 2020-01-01 DIAGNOSIS — I340 Nonrheumatic mitral (valve) insufficiency: Secondary | ICD-10-CM | POA: Insufficient documentation

## 2020-01-01 DIAGNOSIS — Z9049 Acquired absence of other specified parts of digestive tract: Secondary | ICD-10-CM | POA: Diagnosis not present

## 2020-01-01 DIAGNOSIS — M109 Gout, unspecified: Secondary | ICD-10-CM | POA: Diagnosis not present

## 2020-01-01 DIAGNOSIS — I130 Hypertensive heart and chronic kidney disease with heart failure and stage 1 through stage 4 chronic kidney disease, or unspecified chronic kidney disease: Secondary | ICD-10-CM | POA: Insufficient documentation

## 2020-01-01 DIAGNOSIS — E785 Hyperlipidemia, unspecified: Secondary | ICD-10-CM | POA: Diagnosis not present

## 2020-01-01 DIAGNOSIS — I2510 Atherosclerotic heart disease of native coronary artery without angina pectoris: Secondary | ICD-10-CM | POA: Diagnosis not present

## 2020-01-01 DIAGNOSIS — I25810 Atherosclerosis of coronary artery bypass graft(s) without angina pectoris: Secondary | ICD-10-CM | POA: Insufficient documentation

## 2020-01-01 DIAGNOSIS — N184 Chronic kidney disease, stage 4 (severe): Secondary | ICD-10-CM | POA: Diagnosis not present

## 2020-01-01 DIAGNOSIS — I252 Old myocardial infarction: Secondary | ICD-10-CM | POA: Diagnosis not present

## 2020-01-01 DIAGNOSIS — I255 Ischemic cardiomyopathy: Secondary | ICD-10-CM | POA: Insufficient documentation

## 2020-01-01 DIAGNOSIS — E1122 Type 2 diabetes mellitus with diabetic chronic kidney disease: Secondary | ICD-10-CM | POA: Diagnosis not present

## 2020-01-01 DIAGNOSIS — I5042 Chronic combined systolic (congestive) and diastolic (congestive) heart failure: Secondary | ICD-10-CM

## 2020-01-01 DIAGNOSIS — Z79899 Other long term (current) drug therapy: Secondary | ICD-10-CM | POA: Insufficient documentation

## 2020-01-01 DIAGNOSIS — I4892 Unspecified atrial flutter: Secondary | ICD-10-CM | POA: Insufficient documentation

## 2020-01-01 DIAGNOSIS — Z85828 Personal history of other malignant neoplasm of skin: Secondary | ICD-10-CM | POA: Diagnosis not present

## 2020-01-01 DIAGNOSIS — Z87891 Personal history of nicotine dependence: Secondary | ICD-10-CM | POA: Insufficient documentation

## 2020-01-01 DIAGNOSIS — I482 Chronic atrial fibrillation, unspecified: Secondary | ICD-10-CM | POA: Diagnosis not present

## 2020-01-01 DIAGNOSIS — Z8261 Family history of arthritis: Secondary | ICD-10-CM | POA: Insufficient documentation

## 2020-01-01 DIAGNOSIS — I4510 Unspecified right bundle-branch block: Secondary | ICD-10-CM | POA: Diagnosis not present

## 2020-01-01 DIAGNOSIS — E1142 Type 2 diabetes mellitus with diabetic polyneuropathy: Secondary | ICD-10-CM | POA: Diagnosis not present

## 2020-01-01 DIAGNOSIS — Z7982 Long term (current) use of aspirin: Secondary | ICD-10-CM | POA: Diagnosis not present

## 2020-01-01 LAB — CBC
HCT: 38.9 % — ABNORMAL LOW (ref 39.0–52.0)
Hemoglobin: 12.3 g/dL — ABNORMAL LOW (ref 13.0–17.0)
MCH: 31.3 pg (ref 26.0–34.0)
MCHC: 31.6 g/dL (ref 30.0–36.0)
MCV: 99 fL (ref 80.0–100.0)
Platelets: 143 10*3/uL — ABNORMAL LOW (ref 150–400)
RBC: 3.93 MIL/uL — ABNORMAL LOW (ref 4.22–5.81)
RDW: 15 % (ref 11.5–15.5)
WBC: 6.6 10*3/uL (ref 4.0–10.5)
nRBC: 0 % (ref 0.0–0.2)

## 2020-01-01 LAB — PROTIME-INR
INR: 1.2 (ref 0.8–1.2)
Prothrombin Time: 15.4 seconds — ABNORMAL HIGH (ref 11.4–15.2)

## 2020-01-01 LAB — BASIC METABOLIC PANEL
Anion gap: 11 (ref 5–15)
BUN: 30 mg/dL — ABNORMAL HIGH (ref 8–23)
CO2: 25 mmol/L (ref 22–32)
Calcium: 9.9 mg/dL (ref 8.9–10.3)
Chloride: 99 mmol/L (ref 98–111)
Creatinine, Ser: 2.16 mg/dL — ABNORMAL HIGH (ref 0.61–1.24)
GFR calc Af Amer: 31 mL/min — ABNORMAL LOW (ref >60)
GFR calc non Af Amer: 27 mL/min — ABNORMAL LOW (ref >60)
Glucose, Bld: 322 mg/dL — ABNORMAL HIGH (ref 70–99)
Potassium: 4 mmol/L (ref 3.5–5.1)
Sodium: 135 mmol/L (ref 135–145)

## 2020-01-01 MED ORDER — SODIUM CHLORIDE 0.9% FLUSH: 3.0000 mL | Freq: Two times a day (BID) | INTRAVENOUS | Status: DC

## 2020-01-01 MED ORDER — FUROSEMIDE 80 MG PO TABS: 80.0000 mg | ORAL_TABLET | Freq: Two times a day (BID) | ORAL | 5 refills | Status: DC

## 2020-01-01 NOTE — Progress Notes (Signed)
ReDS Vest / Clip - 01/01/20 1200      ReDS Vest / Clip   Station Marker  D    Ruler Value  34    ReDS Value Range  (!) High volume overload    ReDS Actual Value  45    Anatomical Comments  sitting

## 2020-01-01 NOTE — Patient Instructions (Addendum)
INCREASE Lasix 80mg  (1 tab) twice a day   Your physician recommends that you schedule a follow-up appointment in: 1 month with Dr [name]   NEXT APPOINTMENT: Wednesday March 24th, 2021 at 9:40A. [name] code [name] today We will only contact you if something comes back abnormal or we need to make some changes. Otherwise no news is good news!   Please call office at [phone] option [phone] if you have any questions or concerns.    [name] [place] [id number] [place] [id number] Dept: [phone] Loc: [phone]  [name]  01/01/2020  You are scheduled for a Transesophageal Echocardiogram and  Cardiac Catheterization on Monday, February 22 with Dr. Glori [name].  1. Please arrive at the [facility] (Main Entrance A) at [facility]: [address] [place], [place] [address] at 7:00 AM (This time is two hours before your procedure to ensure your preparation). Free valet parking service is available.   Special note: Every effort is made to have your procedure done on time. Please understand that emergencies sometimes delay scheduled procedures.  2. Diet: Do not eat solid foods or drink fluids after midnight.  The patient may have clear liquids until 5am upon the day of the procedure.  3. Labs: Done today in office  You will need a pre procedure COVID test    WHEN: Friday February 19th, 2021 at [place]: [facility]       [name] [id number]  This is a drive thru testing site, you will remain in your car. Be sure to get in the line FOR PROCEDURES Once you have been swabbed you will need to remain home in quarantine until you return for your procedure.   4. Medication instructions in preparation for your procedure:   Contrast Allergy: No   Stop taking Eliquis 1 days before your procedure. (Hold on Sunday  2/21 and the morning of  your procedure.    Take only 12 units of insulin the night before your procedure. Do not take any insulin on the day of the procedure.   HOLD all other oral medications on the morning of your procedure.    On the morning of your procedure, take your Aspirin and any morning medicines NOT listed above.  You may use sips of water.  5. Plan for one night stay--bring personal belongings. 6. Bring a current list of your medications and current insurance cards. 7. You MUST have a responsible person to drive you home. 8. Someone MUST be with you the first 24 hours after you arrive home or your discharge will be delayed. 9. Please wear clothes that are easy to get on and off and wear slip-on shoes.  Thank you for allowing [place] to care for you!   -- [name]

## 2020-01-01 NOTE — H&P (View-Only) (Signed)
ADVANCED HF CLINIC CONSULT NOTE  Referring Physician: Gwenlyn Found Primary Cardiologist: Gwenlyn Found  HPI:  [name] is an 86 y.o. male with h/o CAD s/p re-do CABG (2440,[id number]), carotid disease s/p CEA, HTN, HLN, chronic AF, chronic GI bleeding s/p left hemicolectomy. He is referred by Dr. Gwenlyn Found for evaluation of ongoing dyspnea.   He has had several caths over the past 5 years with stenting of SVG to OM on 2015. Last cath 9/19 newly occluded SVG to RCA and high-grade OM1 lesion which was treated medically due to unfavorable anatomy.   I saw him for the first time in 1/21 and I felt MR was the major issue. Suggested RHC and TEE but he wanted to think about it first. Hydralazine 25 tid added for afterload reduction.   Here for f/u. Following BP and HR closely. BP 110-130/60-70s  HR 60-70s. Took hydralazine for a month and now out of it for 6 months. Weight down 2 pounds. Says he still gets tired very easily. Trying to walk more around the house. Can walk more than 5 mins due mainly to fatigue. Very hard to climb steps due to fatigue and leg weakness. No edema, orthopnea or PND.    Echo 8/20  1. LVEF 40-45% akinesis of the mid and apical inferior walls and hypokinesis of the inferolateral walls consistent with prior infarct in the RCA or left Cx territory.  2. The right ventricle has moderately reduced systolic function. The cavity was normal. There is no increase in right ventricular wall thickness. Right ventricular systolic pressure is severely elevated at 77mmHg .  3. Moderate MR  4. The aortic valve has an indeterminate number of cusps. Moderate thickening of the aortic valve. Severe calcifcation of the aortic valve. Aortic valve regurgitation is mild by color flow Doppler.   Past Medical History:  Diagnosis Date  . Age-related macular degeneration, wet, both eyes ([place])   . Anemia    Secondary to acute blood loss  . Anginal pain ([place])   . Arthritis    "mild in hands, knees, ankles"  (11/13/2015)  . Atrial fibrillation ([place])    Consideration was given for atrial flutter ablation, but patient developed atrial fibrillation. Cardioversion was done. Dr. Caryl Comes decided to watch him clinically. November, 2011  . Atrial flutter [facility]) 07/2010   September, 2011   Hospital with PNA and cath done.Marland KitchenMarland KitchenCoumadin.  Atrial flutter ablation planned, but  pt. then had atrial fibrillation,/outpatient conversion 09/08/10..NSR..plan to follow..Dr. Caryl Comes  . CAD (coronary artery disease)    Catheterization, September, 2011,  grafts patent from redo CABG,, medical therapy of coronary disease, consideration to proceeding with atrial flutter ablation  . Carotid artery disease ([place])    Doppler 09/18/2009 - 49% bilateral stenoses  . Carotid artery disease ([name])    49% bilateral, Doppler, November, 2010  . Chronic kidney disease (CKD), stage III (moderate)   . Diabetic peripheral neuropathy ([place])   . Diverticulosis of colon with hemorrhage 2009   several unit diverticular bleed   . Ejection fraction    EF 60%, echo, 2009  //   EF 65%, echo, September, 2011  . Erectile dysfunction    Mild  . Gout   . History of blood transfusion "several"   related to diverticular bleeding  . Hyperlipidemia   . Hypertension   . Mitral regurgitation    Mild, echo, September, 2011  . NSTEMI (non-ST elevated myocardial infarction) ([place]) 07/2010   at [facility] with repeat cath, rec medical mgmt   .  OSA on CPAP   . Personal history of colonic polyps   . PNA (pneumonia) 9/11   NSTEMI at [facility] with repeat cath, rec medical mgmt   . RBBB (right bundle branch block)   . Shoulder pain    "positional; better now" (11/13/2015)  . Skin cancer    R lower leg, per derm 2012  . Type II diabetes mellitus ([place])     Current Outpatient Medications  Medication Sig Dispense Refill  . apixaban (ELIQUIS) 2.5 MG TABS tablet Take 1 tablet (2.5 mg total) by mouth 2 (two) times daily. 180 tablet 1  . aspirin 81 MG tablet Take 81 mg by mouth  daily.    . BD INSULIN SYRINGE ULTRAFINE 31G X 5/16" 0.3 ML MISC USE DAILY AS INSTRUCTED 100 each 2  . Cholecalciferol (VITAMIN D) 1000 UNITS capsule Take 1,000 Units by mouth 2 (two) times a day.     . colchicine 0.6 MG tablet Take 1 tablet (0.6 mg total) by mouth daily as needed (Fri, Sat, Sun, Mon).    . fish oil-omega-3 fatty acids 1000 MG capsule Take 1 g by mouth daily.     . furosemide (LASIX) 80 MG tablet Take 1 tablet (80 mg total) by mouth every morning AND 0.5 tablets (40 mg total) every evening. 90 tablet 3  . glucose blood (FREESTYLE LITE) test strip USE TO TEST BLOOD SUGAR ONCE DAILY AND AS NEEDED FOR DIABETES MELLITUS 300 each 3  . hydrALAZINE (APRESOLINE) 25 MG tablet Take 1 tablet (25 mg total) by mouth 2 (two) times daily. 60 tablet 3  . isosorbide mononitrate (IMDUR) 60 MG 24 hr tablet TAKE ONE AND ONE-HALF TABLETS (90 MG) IN THE MORNING AND 1 TABLET (60 MG) IN THE EVENING  (NEW INSTRUCTIONS) 270 tablet 3  . LANTUS 100 UNIT/ML injection INJECT 20 TO 25 UNITS AT BEDTIME 40 mL 3  . magnesium oxide (MAG-OX) 400 MG tablet Take 400 mg by mouth daily.     . metoprolol tartrate (LOPRESSOR) 50 MG tablet TAKE 1 TABLET TWICE A DAY 180 tablet 1  . Multiple Vitamin (MULTIVITAMIN WITH MINERALS) TABS Take 1 tablet by mouth daily.    . nitroGLYCERIN (NITROSTAT) 0.4 MG SL tablet Place 1 tablet (0.4 mg total) under the tongue every 5 (five) minutes as needed for chest pain. 100 tablet 1  . Potassium Gluconate (K-99) 595 MG CAPS Take 595 mg by mouth daily.     . ranolazine (RANEXA) 500 MG 12 hr tablet TAKE 1 TABLET TWICE A DAY 180 tablet 2  . rosuvastatin (CRESTOR) 40 MG tablet Take 1 tablet (40 mg total) by mouth at bedtime. 90 tablet 3  . traMADol (ULTRAM) 50 MG tablet Take 50 mg by mouth every 6 (six) hours as needed for moderate pain.     No current facility-administered medications for this encounter.    No Known Allergies    Social History   Socioeconomic History  . Marital status:  Widowed    Spouse name: Not on file  . Number of children: 2  . Years of education: Not on file  . Highest education level: Not on file  Occupational History  . 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 Smoking status: Former Smoker    Packs/day: 1.00    Years: 8.00    Pack years: 8.00    Types: Cigarettes  . Smokeless tobacco: Never Used  . Tobacco comment: "quit smoking cigarettes in  1958"  Substance and Sexual Activity  . Alcohol use: No    Alcohol/week: 0.0 standard drinks  . Drug use: No  . Sexual activity: Yes  Other Topics Concern  . Not on file  Social History Narrative   From [place].  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     Lives with girlfriend [name].  Widowed 12/2005.   Social Determinants of Health   Financial Resource Strain: Low Risk   . Difficulty of Paying Living Expenses: Not hard at all  Food Insecurity: No Food Insecurity  . Worried About Charity fundraiser in the Last Year: Never true  . Ran Out of Food in the Last Year: Never true  Transportation Needs: No Transportation Needs  . Lack of Transportation (Medical): No  . Lack of Transportation (Non-Medical): No  Physical Activity: Inactive  . Days of Exercise per Week: 0 days  . Minutes of Exercise per Session: 0 min  Stress: No Stress Concern Present  . Feeling of Stress : Only a little  Social Connections:   . Frequency of Communication with Friends and Family: Not on file  . Frequency of Social Gatherings with Friends and Family: Not on file  . Attends Religious Services: Not on file  . Active Member of Clubs or Organizations: Not on file  . Attends Archivist Meetings: Not on file  . Marital Status: Not on file  Intimate Partner Violence: Not At Risk  . Fear of Current or Ex-Partner: No  . Emotionally Abused: No  . Physically Abused: No  . Sexually Abused: No      Family History  Problem Relation  Age of Onset  . Kidney disease Mother        Kidney failure  . Stroke Mother   . Diabetes Mother   . Heart disease Father        MI  . Arthritis Sister   . Cancer Sister        Throat  . Heart disease Sister        MI  . Diabetes Sister   . Prostate cancer Neg Hx   . Colon cancer Neg Hx     Vitals:   01/01/20 1214  BP: 122/70  Pulse: 65  SpO2: 98%  Weight: 85.5 kg (188 lb 6.4 oz)    PHYSICAL EXAM: General:  Elderly. No respiratory difficulty HEENT: normal Neck: supple. JVP 7-8. Carotids 2+ bilat; no bruits. No lymphadenopathy or thryomegaly appreciated. Cor: PMI nondisplaced. Irregular rate & rhythm. 3/6 MR, 2/6 TR Lungs: clear Abdomen: soft, nontender, nondistended. No hepatosplenomegaly. No bruits or masses. Good bowel sounds. Extremities: no cyanosis, clubbing, rash, 1+ edema Neuro: alert & oriented x 3, cranial nerves grossly intact. moves all 4 extremities w/o difficulty. Affect pleasant.  ECG: AF 68 RBBB/LAFB Personally reviewed  ReDS 32%-> 45%  ASSESSMENT & PLAN:  1. Dyspnea/chronic systolic HF due to ischemic CM  - EF 40-45% echo 8/20 3+ MR - he has had progressive deterioration in his functional capacity over the past few months. Now NYHA IIIb - suspect multifactorial but in reviewing hie echo I think his MR is quite severe and likely a major factor. - GDMT limited by CKD 4  - on our last visit I felt he was volume overloaded on exam but ReDS at upper end of normal range 32% (20-35%). Lasix increased from 80 dailly to 80/40. Today ReDS 45% c/w marked volume overload. Will cautiously increase lasi  to 80 bid. - At last visit I discussed possibility of MitraClip at length with him and his wife and suggested RHC and TEE as next steps. He wanted to think about it first. I will see him back in a few weeks to re-discuss and plan formal referral to Structural Clinic at that time - I reviewed studies with Dr. Burt Knack who agrees he may be candidate for Clip - We  discussed at length today including the fact that clipping his MV will not improve his musculoskeletal issues and he has to continue to try and be active. They would like to proceed with w/u for MV clip.  - Will arrange TEE and RHC next week   2. Mitral regurgitation - likely ischemic in nature. At least 3+ by TTE (may be worse) - plan as above. Arrange TEE and RHC - he has runa out of  hydralazine 25 bid for BP control and afterload reduction. Will not restart given normal BP and no improvement in symptoms  3. HTN - Blood pressure well controlled. Continue current regimen.  4. CAD s/p re-do CABG - recent cath with significant CAD but not amenable to PCI - continue medical management per Dr. Gwenlyn Found - No s/s angina  5. Chronic AF - by ECG dates back to at least 7/18. Likely no role for DC-CV here - continue apixaban  Total time spent 45 minutes. Over half that time spent discussing above.    Glori Bickers, MD  12:29 PM

## 2020-01-01 NOTE — Progress Notes (Signed)
ADVANCED HF CLINIC CONSULT NOTE  Referring Physician: Gwenlyn Found Primary Cardiologist: Gwenlyn Found  HPI:  [name] is an 86 y.o. male with h/o CAD s/p re-do CABG (1610,9604), carotid disease s/p CEA, HTN, HLN, chronic AF, chronic GI bleeding s/p left hemicolectomy. He is referred by Dr. Gwenlyn Found for evaluation of ongoing dyspnea.   He has had several caths over the past 5 years with stenting of SVG to OM on 2015. Last cath 9/19 newly occluded SVG to RCA and high-grade OM1 lesion which was treated medically due to unfavorable anatomy.   I saw him for the first time in 1/21 and I felt MR was the major issue. Suggested RHC and TEE but he wanted to think about it first. Hydralazine 25 tid added for afterload reduction.   Here for f/u. Following BP and HR closely. BP 110-130/60-70s  HR 60-70s. Took hydralazine for a month and now out of it for 6 months. Weight down 2 pounds. Says he still gets tired very easily. Trying to walk more around the house. Can walk more than 5 mins due mainly to fatigue. Very hard to climb steps due to fatigue and leg weakness. No edema, orthopnea or PND.    Echo 8/20  1. LVEF 40-45% akinesis of the mid and apical inferior walls and hypokinesis of the inferolateral walls consistent with prior infarct in the RCA or left Cx territory.  2. The right ventricle has moderately reduced systolic function. The cavity was normal. There is no increase in right ventricular wall thickness. Right ventricular systolic pressure is severely elevated at 107mmHg .  3. Moderate MR  4. The aortic valve has an indeterminate number of cusps. Moderate thickening of the aortic valve. Severe calcifcation of the aortic valve. Aortic valve regurgitation is mild by color flow Doppler.   Past Medical History:  Diagnosis Date  . Age-related macular degeneration, wet, both eyes ([place])   . Anemia    Secondary to acute blood loss  . Anginal pain ([place])   . Arthritis    "mild in hands, knees, ankles"  (11/13/2015)  . Atrial fibrillation ([place])    Consideration was given for atrial flutter ablation, but patient developed atrial fibrillation. Cardioversion was done. Dr. Caryl Comes decided to watch him clinically. November, 2011  . Atrial flutter [facility]) 07/2010   September, 2011   Hospital with PNA and cath done.Marland KitchenMarland KitchenCoumadin.  Atrial flutter ablation planned, but  pt. then had atrial fibrillation,/outpatient conversion 09/08/10..NSR..plan to follow..Dr. Caryl Comes  . CAD (coronary artery disease)    Catheterization, September, 2011,  grafts patent from redo CABG,, medical therapy of coronary disease, consideration to proceeding with atrial flutter ablation  . Carotid artery disease ([place])    Doppler 09/18/2009 - 49% bilateral stenoses  . Carotid artery disease ([name])    49% bilateral, Doppler, November, 2010  . Chronic kidney disease (CKD), stage III (moderate)   . Diabetic peripheral neuropathy ([place])   . Diverticulosis of colon with hemorrhage 2009   several unit diverticular bleed   . Ejection fraction    EF 60%, echo, 2009  //   EF 65%, echo, September, 2011  . Erectile dysfunction    Mild  . Gout   . History of blood transfusion "several"   related to diverticular bleeding  . Hyperlipidemia   . Hypertension   . Mitral regurgitation    Mild, echo, September, 2011  . NSTEMI (non-ST elevated myocardial infarction) ([place]) 07/2010   at [facility] with repeat cath, rec medical mgmt   .  OSA on CPAP   . Personal history of colonic polyps   . PNA (pneumonia) 9/11   [facility]STEMI at [facility] with repeat cath, rec medical mgmt   . RBBB (right bundle branch block)   . Shoulder pain    "positional; better now" (11/13/2015)  . Skin cancer    R lower leg, per derm 2012  . Type II diabetes mellitus ([place])     Current Outpatient Medications  Medication Sig Dispense Refill  . apixaban (ELIQUIS) 2.5 MG TABS tablet Take 1 tablet (2.5 mg total) by mouth 2 (two) times daily. 180 tablet 1  . aspirin 81 MG tablet Take 81 mg by mouth  daily.    . BD INSULIN SYRINGE ULTRAFINE 31G X 5/16" 0.3 ML MISC USE DAILY AS INSTRUCTED 100 each 2  . Cholecalciferol (VITAMIN D) 1000 UNITS capsule Take 1,000 Units by mouth 2 (two) times a day.     . colchicine 0.6 MG tablet Take 1 tablet (0.6 mg total) by mouth daily as needed (Fri, Sat, Sun, Mon).    . fish oil-omega-3 fatty acids 1000 MG capsule Take 1 g by mouth daily.     . furosemide (LASIX) 80 MG tablet Take 1 tablet (80 mg total) by mouth every morning AND 0.5 tablets (40 mg total) every evening. 90 tablet 3  . glucose blood (FREESTYLE LITE) test strip USE TO TEST BLOOD SUGAR ONCE DAILY AND AS NEEDED FOR DIABETES MELLITUS 300 each 3  . hydrALAZINE (APRESOLINE) 25 MG tablet Take 1 tablet (25 mg total) by mouth 2 (two) times daily. 60 tablet 3  . isosorbide mononitrate (IMDUR) 60 MG 24 hr tablet TAKE ONE AND ONE-HALF TABLETS (90 MG) IN THE MORNING AND 1 TABLET (60 MG) IN THE EVENING  (NEW INSTRUCTIONS) 270 tablet 3  . LANTUS 100 UNIT/ML injection INJECT 20 TO 25 UNITS AT BEDTIME 40 mL 3  . magnesium oxide (MAG-OX) 400 MG tablet Take 400 mg by mouth daily.     . metoprolol tartrate (LOPRESSOR) 50 MG tablet TAKE 1 TABLET TWICE A DAY 180 tablet 1  . Multiple Vitamin (MULTIVITAMIN WITH MINERALS) TABS Take 1 tablet by mouth daily.    . nitroGLYCERIN (NITROSTAT) 0.4 MG SL tablet Place 1 tablet (0.4 mg total) under the tongue every 5 (five) minutes as needed for chest pain. 100 tablet 1  . Potassium Gluconate (K-99) 595 MG CAPS Take 595 mg by mouth daily.     . ranolazine (RANEXA) 500 MG 12 hr tablet TAKE 1 TABLET TWICE A DAY 180 tablet 2  . rosuvastatin (CRESTOR) 40 MG tablet Take 1 tablet (40 mg total) by mouth at bedtime. 90 tablet 3  . traMADol (ULTRAM) 50 MG tablet Take 50 mg by mouth every 6 (six) hours as needed for moderate pain.     No current facility-administered medications for this encounter.    No Known Allergies    Social History   Socioeconomic History  . Marital status:  Widowed    Spouse name: Not on file  . Number of children: 2  . Years of education: Not on file  . Highest education level: Not on file  Occupational History  . 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 Smoking status: Former Smoker    Packs/day: 1.00    Years: 8.00    Pack years: 8.00    Types: Cigarettes  . Smokeless tobacco: Never Used  . Tobacco comment: "quit smoking cigarettes in  1958"  Substance and Sexual Activity  . Alcohol use: No    Alcohol/week: 0.0 standard drinks  . Drug use: No  . Sexual activity: Yes  Other Topics Concern  . Not on file  Social History Narrative   From [place].  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     Lives with girlfriend [name].  Widowed 12/2005.   Social Determinants of Health   Financial Resource Strain: Low Risk   . Difficulty of Paying Living Expenses: Not hard at all  Food Insecurity: No Food Insecurity  . Worried About Charity fundraiser in the Last Year: Never true  . Ran Out of Food in the Last Year: Never true  Transportation Needs: No Transportation Needs  . Lack of Transportation (Medical): No  . Lack of Transportation (Non-Medical): No  Physical Activity: Inactive  . Days of Exercise per Week: 0 days  . Minutes of Exercise per Session: 0 min  Stress: No Stress Concern Present  . Feeling of Stress : Only a little  Social Connections:   . Frequency of Communication with Friends and Family: Not on file  . Frequency of Social Gatherings with Friends and Family: Not on file  . Attends Religious Services: Not on file  . Active Member of Clubs or Organizations: Not on file  . Attends Archivist Meetings: Not on file  . Marital Status: Not on file  Intimate Partner Violence: Not At Risk  . Fear of Current or Ex-Partner: No  . Emotionally Abused: No  . Physically Abused: No  . Sexually Abused: No      Family History  Problem Relation  Age of Onset  . Kidney disease Mother        Kidney failure  . Stroke Mother   . Diabetes Mother   . Heart disease Father        MI  . Arthritis Sister   . Cancer Sister        Throat  . Heart disease Sister        MI  . Diabetes Sister   . Prostate cancer Neg Hx   . Colon cancer Neg Hx     Vitals:   01/01/20 1214  BP: 122/70  Pulse: 65  SpO2: 98%  Weight: 85.5 kg (188 lb 6.4 oz)    PHYSICAL EXAM: General:  Elderly. No respiratory difficulty HEENT: normal Neck: supple. JVP 7-8. Carotids 2+ bilat; no bruits. No lymphadenopathy or thryomegaly appreciated. Cor: PMI nondisplaced. Irregular rate & rhythm. 3/6 MR, 2/6 TR Lungs: clear Abdomen: soft, nontender, nondistended. No hepatosplenomegaly. No bruits or masses. Good bowel sounds. Extremities: no cyanosis, clubbing, rash, 1+ edema Neuro: alert & oriented x 3, cranial nerves grossly intact. moves all 4 extremities w/o difficulty. Affect pleasant.  ECG: AF 68 RBBB/LAFB Personally reviewed  ReDS 32%-> 45%  ASSESSMENT & PLAN:  1. Dyspnea/chronic systolic HF due to ischemic CM  - EF 40-45% echo 8/20 3+ MR - he has had progressive deterioration in his functional capacity over the past few months. Now NYHA IIIb - suspect multifactorial but in reviewing hie echo I think his MR is quite severe and likely a major factor. - GDMT limited by CKD 4  - on our last visit I felt he was volume overloaded on exam but ReDS at upper end of normal range 32% (20-35%). Lasix increased from 80 dailly to 80/40. Today ReDS 45% c/w marked volume overload. Will cautiously increase lasi  to 80 bid. - At last visit I discussed possibility of MitraClip at length with him and his wife and suggested RHC and TEE as next steps. He wanted to think about it first. I will see him back in a few weeks to re-discuss and plan formal referral to Structural Clinic at that time - I reviewed studies with Dr. Burt Knack who agrees he may be candidate for Clip - We  discussed at length today including the fact that clipping his MV will not improve his musculoskeletal issues and he has to continue to try and be active. They would like to proceed with w/u for MV clip.  - Will arrange TEE and RHC next week   2. Mitral regurgitation - likely ischemic in nature. At least 3+ by TTE (may be worse) - plan as above. Arrange TEE and RHC - he has runa out of  hydralazine 25 bid for BP control and afterload reduction. Will not restart given normal BP and no improvement in symptoms  3. HTN - Blood pressure well controlled. Continue current regimen.  4. CAD s/p re-do CABG - recent cath with significant CAD but not amenable to PCI - continue medical management per Dr. Gwenlyn Found - No s/s angina  5. Chronic AF - by ECG dates back to at least 7/18. Likely no role for DC-CV here - continue apixaban  Total time spent 45 minutes. Over half that time spent discussing above.    Glori Bickers, MD  12:29 PM

## 2020-01-03 ENCOUNTER — Other Ambulatory Visit: Payer: Self-pay

## 2020-01-03 ENCOUNTER — Other Ambulatory Visit (HOSPITAL_COMMUNITY)
Admission: RE | Admit: 2020-01-03 | Discharge: 2020-01-03 | Disposition: A | Discharge status: Home / Self Care | Payer: Medicare Other | Source: Ambulatory Visit | Attending: Internal Medicine | Admitting: Internal Medicine

## 2020-01-03 DIAGNOSIS — Z20822 Contact with and (suspected) exposure to covid-19: Secondary | ICD-10-CM | POA: Diagnosis not present

## 2020-01-03 DIAGNOSIS — Z01812 Encounter for preprocedural laboratory examination: Secondary | ICD-10-CM | POA: Diagnosis not present

## 2020-01-03 LAB — SARS CORONAVIRUS 2 (TAT 6-24 HRS): SARS Coronavirus 2: NEGATIVE

## 2020-01-06 ENCOUNTER — Encounter (HOSPITAL_COMMUNITY): Payer: Self-pay | Admitting: Internal Medicine

## 2020-01-06 ENCOUNTER — Encounter (HOSPITAL_COMMUNITY)
Admission: RE | Disposition: A | Discharge status: Home / Self Care | Payer: Self-pay | Source: Home / Self Care | Attending: Internal Medicine

## 2020-01-06 ENCOUNTER — Ambulatory Visit (HOSPITAL_BASED_OUTPATIENT_CLINIC_OR_DEPARTMENT_OTHER)
Admission: RE | Admit: 2020-01-06 | Discharge: 2020-01-06 | Disposition: A | Discharge status: Home / Self Care | Payer: Medicare Other | Source: Home / Self Care | Attending: Cardiology | Admitting: Cardiology

## 2020-01-06 ENCOUNTER — Ambulatory Visit (HOSPITAL_COMMUNITY)
Admission: RE | Admit: 2020-01-06 | Discharge: 2020-01-06 | Disposition: A | Discharge status: Home / Self Care | Payer: Medicare Other | Attending: Internal Medicine | Admitting: Internal Medicine

## 2020-01-06 DIAGNOSIS — N184 Chronic kidney disease, stage 4 (severe): Secondary | ICD-10-CM | POA: Insufficient documentation

## 2020-01-06 DIAGNOSIS — I080 Rheumatic disorders of both mitral and aortic valves: Secondary | ICD-10-CM | POA: Insufficient documentation

## 2020-01-06 DIAGNOSIS — E785 Hyperlipidemia, unspecified: Secondary | ICD-10-CM | POA: Diagnosis not present

## 2020-01-06 DIAGNOSIS — I255 Ischemic cardiomyopathy: Secondary | ICD-10-CM | POA: Diagnosis not present

## 2020-01-06 DIAGNOSIS — Z79899 Other long term (current) drug therapy: Secondary | ICD-10-CM | POA: Insufficient documentation

## 2020-01-06 DIAGNOSIS — M109 Gout, unspecified: Secondary | ICD-10-CM | POA: Diagnosis not present

## 2020-01-06 DIAGNOSIS — I5022 Chronic systolic (congestive) heart failure: Secondary | ICD-10-CM | POA: Diagnosis not present

## 2020-01-06 DIAGNOSIS — I25810 Atherosclerosis of coronary artery bypass graft(s) without angina pectoris: Secondary | ICD-10-CM | POA: Diagnosis not present

## 2020-01-06 DIAGNOSIS — I252 Old myocardial infarction: Secondary | ICD-10-CM | POA: Diagnosis not present

## 2020-01-06 DIAGNOSIS — I6523 Occlusion and stenosis of bilateral carotid arteries: Secondary | ICD-10-CM | POA: Diagnosis not present

## 2020-01-06 DIAGNOSIS — E1142 Type 2 diabetes mellitus with diabetic polyneuropathy: Secondary | ICD-10-CM | POA: Insufficient documentation

## 2020-01-06 DIAGNOSIS — Z85828 Personal history of other malignant neoplasm of skin: Secondary | ICD-10-CM | POA: Insufficient documentation

## 2020-01-06 DIAGNOSIS — Z7901 Long term (current) use of anticoagulants: Secondary | ICD-10-CM | POA: Insufficient documentation

## 2020-01-06 DIAGNOSIS — Z951 Presence of aortocoronary bypass graft: Secondary | ICD-10-CM | POA: Diagnosis not present

## 2020-01-06 DIAGNOSIS — Z9049 Acquired absence of other specified parts of digestive tract: Secondary | ICD-10-CM | POA: Diagnosis not present

## 2020-01-06 DIAGNOSIS — Z7982 Long term (current) use of aspirin: Secondary | ICD-10-CM | POA: Insufficient documentation

## 2020-01-06 DIAGNOSIS — G4733 Obstructive sleep apnea (adult) (pediatric): Secondary | ICD-10-CM | POA: Diagnosis not present

## 2020-01-06 DIAGNOSIS — Z794 Long term (current) use of insulin: Secondary | ICD-10-CM | POA: Insufficient documentation

## 2020-01-06 DIAGNOSIS — E1122 Type 2 diabetes mellitus with diabetic chronic kidney disease: Secondary | ICD-10-CM | POA: Diagnosis not present

## 2020-01-06 DIAGNOSIS — Z87891 Personal history of nicotine dependence: Secondary | ICD-10-CM | POA: Insufficient documentation

## 2020-01-06 DIAGNOSIS — I482 Chronic atrial fibrillation, unspecified: Secondary | ICD-10-CM | POA: Insufficient documentation

## 2020-01-06 DIAGNOSIS — I5042 Chronic combined systolic (congestive) and diastolic (congestive) heart failure: Secondary | ICD-10-CM

## 2020-01-06 DIAGNOSIS — I340 Nonrheumatic mitral (valve) insufficiency: Secondary | ICD-10-CM | POA: Diagnosis not present

## 2020-01-06 DIAGNOSIS — I130 Hypertensive heart and chronic kidney disease with heart failure and stage 1 through stage 4 chronic kidney disease, or unspecified chronic kidney disease: Secondary | ICD-10-CM | POA: Insufficient documentation

## 2020-01-06 HISTORY — PX: RIGHT HEART CATH: CATH118263

## 2020-01-06 HISTORY — PX: TEE WITHOUT CARDIOVERSION: SHX5443

## 2020-01-06 LAB — POCT I-STAT EG7
Acid-Base Excess: 6 mmol/L — ABNORMAL HIGH (ref 0.0–2.0)
Acid-Base Excess: 6 mmol/L — ABNORMAL HIGH (ref 0.0–2.0)
Bicarbonate: 32.3 mmol/L — ABNORMAL HIGH (ref 20.0–28.0)
Bicarbonate: 32.3 mmol/L — ABNORMAL HIGH (ref 20.0–28.0)
Calcium, Ion: 1.31 mmol/L (ref 1.15–1.40)
Calcium, Ion: 1.37 mmol/L (ref 1.15–1.40)
HCT: 35 % — ABNORMAL LOW (ref 39.0–52.0)
HCT: 36 % — ABNORMAL LOW (ref 39.0–52.0)
Hemoglobin: 11.9 g/dL — ABNORMAL LOW (ref 13.0–17.0)
Hemoglobin: 12.2 g/dL — ABNORMAL LOW (ref 13.0–17.0)
O2 Saturation: 68 %
O2 Saturation: 64 %
Potassium: 3.8 mmol/L (ref 3.5–5.1)
Potassium: 3.9 mmol/L (ref 3.5–5.1)
Sodium: 141 mmol/L (ref 135–145)
Sodium: 141 mmol/L (ref 135–145)
TCO2: 34 mmol/L — ABNORMAL HIGH (ref 22–32)
TCO2: 34 mmol/L — ABNORMAL HIGH (ref 22–32)
pCO2, Ven: 54.9 mmHg (ref 44.0–60.0)
pCO2, Ven: 55.1 mmHg (ref 44.0–60.0)
pH, Ven: 7.378 (ref 7.250–7.430)
pH, Ven: 7.377 (ref 7.250–7.430)
pO2, Ven: 37 mmHg (ref 32.0–45.0)
pO2, Ven: 35 mmHg (ref 32.0–45.0)

## 2020-01-06 LAB — POCT I-STAT, CHEM 8
BUN: 39 mg/dL — ABNORMAL HIGH (ref 8–23)
Calcium, Ion: 1.29 mmol/L (ref 1.15–1.40)
Chloride: 98 mmol/L (ref 98–111)
Creatinine, Ser: 2.4 mg/dL — ABNORMAL HIGH (ref 0.61–1.24)
Glucose, Bld: 224 mg/dL — ABNORMAL HIGH (ref 70–99)
HCT: 39 % (ref 39.0–52.0)
Hemoglobin: 13.3 g/dL (ref 13.0–17.0)
Potassium: 3.8 mmol/L (ref 3.5–5.1)
Sodium: 140 mmol/L (ref 135–145)
TCO2: 33 mmol/L — ABNORMAL HIGH (ref 22–32)

## 2020-01-06 SURGERY — RIGHT HEART CATH
Anesthesia: LOCAL

## 2020-01-06 SURGERY — ECHOCARDIOGRAM, TRANSESOPHAGEAL
Anesthesia: Moderate Sedation

## 2020-01-06 MED ORDER — SODIUM CHLORIDE 0.9 % IV SOLN: INTRAVENOUS | Status: DC

## 2020-01-06 MED ORDER — HEPARIN (PORCINE) IN NACL 1000-0.9 UT/500ML-% IV SOLN
INTRAVENOUS | Status: AC
  Filled 2020-01-06: qty 500

## 2020-01-06 MED ORDER — LIDOCAINE HCL (PF) 1 % IJ SOLN
INTRAMUSCULAR | Status: AC
  Filled 2020-01-06: qty 30

## 2020-01-06 MED ORDER — SODIUM CHLORIDE 0.9 % IV SOLN: 250.0000 mL | INTRAVENOUS | Status: DC | PRN

## 2020-01-06 MED ORDER — SODIUM CHLORIDE 0.9% FLUSH: 3.0000 mL | INTRAVENOUS | Status: DC | PRN

## 2020-01-06 MED ORDER — ACETAMINOPHEN 325 MG PO TABS: 650.0000 mg | ORAL_TABLET | ORAL | Status: DC | PRN

## 2020-01-06 MED ORDER — HEPARIN (PORCINE) IN NACL 1000-0.9 UT/500ML-% IV SOLN
INTRAVENOUS | Status: DC | PRN
  Administered 2020-01-06: 500 mL

## 2020-01-06 MED ORDER — MIDAZOLAM HCL (PF) 10 MG/2ML IJ SOLN
INTRAMUSCULAR | Status: DC | PRN
  Administered 2020-01-06: 2 mg via INTRAVENOUS

## 2020-01-06 MED ORDER — HYDRALAZINE HCL 20 MG/ML IJ SOLN: 10.0000 mg | INTRAMUSCULAR | Status: DC | PRN

## 2020-01-06 MED ORDER — FENTANYL CITRATE (PF) 100 MCG/2ML IJ SOLN
INTRAMUSCULAR | Status: DC | PRN
  Administered 2020-01-06 (×2): 25 ug via INTRAVENOUS

## 2020-01-06 MED ORDER — ONDANSETRON HCL 4 MG/2ML IJ SOLN: 4.0000 mg | Freq: Four times a day (QID) | INTRAMUSCULAR | Status: DC | PRN

## 2020-01-06 MED ORDER — SODIUM CHLORIDE 0.9% FLUSH: 3.0000 mL | Freq: Two times a day (BID) | INTRAVENOUS | Status: DC

## 2020-01-06 MED ORDER — MIDAZOLAM HCL (PF) 5 MG/ML IJ SOLN
INTRAMUSCULAR | Status: AC
  Filled 2020-01-06: qty 2

## 2020-01-06 MED ORDER — FENTANYL CITRATE (PF) 100 MCG/2ML IJ SOLN
INTRAMUSCULAR | Status: AC
  Filled 2020-01-06: qty 2

## 2020-01-06 MED ORDER — BUTAMBEN-TETRACAINE-BENZOCAINE 2-2-14 % EX AERO
INHALATION_SPRAY | CUTANEOUS | Status: DC | PRN
  Administered 2020-01-06: 10:00:00 2 via TOPICAL

## 2020-01-06 MED ORDER — DIPHENHYDRAMINE HCL 50 MG/ML IJ SOLN
INTRAMUSCULAR | Status: AC
  Filled 2020-01-06: qty 1

## 2020-01-06 MED ORDER — LIDOCAINE HCL (PF) 1 % IJ SOLN
INTRAMUSCULAR | Status: DC | PRN
  Administered 2020-01-06: 1 mL

## 2020-01-06 MED ORDER — LABETALOL HCL 5 MG/ML IV SOLN: 10.0000 mg | INTRAVENOUS | Status: DC | PRN

## 2020-01-06 SURGICAL SUPPLY — 11 items
CATH BALLN WEDGE 5F 110CM (CATHETERS) IMPLANT
CATH SWAN GANZ 7F STRAIGHT (CATHETERS) ×1 IMPLANT
GLIDESHEATH SLENDER 7FR .021G (SHEATH) ×1 IMPLANT
GUIDEWIRE .025 260CM (WIRE) ×1 IMPLANT
PACK CARDIAC CATHETERIZATION (CUSTOM PROCEDURE TRAY) ×2 IMPLANT
PROTECTION STATION PRESSURIZED (MISCELLANEOUS) ×2
SHEATH GLIDE SLENDER 4/5FR (SHEATH) IMPLANT
STATION PROTECTION PRESSURIZED (MISCELLANEOUS) IMPLANT
TRANSDUCER W/STOPCOCK (MISCELLANEOUS) ×2 IMPLANT
TUBING ART PRESS 72  MALE/FEM (TUBING) ×1
TUBING ART PRESS 72 MALE/FEM (TUBING) IMPLANT

## 2020-01-06 NOTE — Interval H&P Note (Signed)
History and Physical Interval Note:  01/06/2020 10:12 AM  [name]  has presented today for surgery, with the diagnosis of hf and severe mitral regurgitation.  The various methods of treatment have been discussed with the patient and family. After consideration of risks, benefits and other options for treatment, the patient has consented to  Procedure(s): RIGHT HEART CATH (N/A) as a surgical intervention.  The patient's history has been reviewed, patient examined, no change in status, stable for surgery.  I have reviewed the patient's chart and labs.  Questions were answered to the patient's satisfaction.     Breeann Reposa

## 2020-01-06 NOTE — CV Procedure (Signed)
    TRANSESOPHAGEAL ECHOCARDIOGRAM   NAME:  [name]   MRN: [id number] DOB:  1932-09-23   ADMIT DATE: 01/06/2020  INDICATIONS:  Mitral regurgitation  PROCEDURE:   Informed consent was obtained prior to the procedure. The risks, benefits and alternatives for the procedure were discussed and the patient comprehended these risks.  Risks include, but are not limited to, cough, sore throat, vomiting, nausea, somnolence, esophageal and stomach trauma or perforation, bleeding, low blood pressure, aspiration, pneumonia, infection, trauma to the teeth and death.    After a procedural time-out, the patient was given 2 mg versed and 50 mcg fentanyl for moderate sedation.  The oropharynx was anesthetized with cetacaine spray.  The transesophageal probe was inserted in the esophagus and stomach without difficulty and multiple views were obtained.    COMPLICATIONS:    There were no immediate complications.  FINDINGS:  LEFT VENTRICLE: EF = 50-55%. Hypokinesis of the inferior wall  RIGHT VENTRICLE: Normal size. Mild hypokinesis  LEFT ATRIUM: Severely dialted  LEFT ATRIAL APPENDAGE: No thrombus.   RIGHT ATRIUM: Severely dialted  AORTIC VALVE:  Trileaflet. Mild calcification. Trivial AI. No significant AS  MITRAL VALVE:   Mildly calcified. Very mild prolapse of anterior leaflet and restriction of posterior leaflet. 3-4+ central/functional MR. Very mild diastolic flow reversal in PV  TRICUSPID VALVE: Normal. Mild TR  PULMONIC VALVE: Grossly normal. Trivial PR  INTERATRIAL SEPTUM: No PFO or ASD.  PERICARDIUM: No effusion  DESCENDING AORTA: Severe plaque   Siyana Erney,MD 10:00 AM

## 2020-01-06 NOTE — Discharge Instructions (Signed)
Remove dressing from right upper arm tomorrow.  If bleeding noted hold pressure for 20.  If questions or concerns call Dr. Polo Riley office. Heart Failure, Self Care Heart failure is a serious condition. This document explains the things you need to do to take care of yourself after a heart failure diagnosis. You may be asked to change your diet, take certain medicines, and make other lifestyle changes in order to stay as healthy as possible. Your health care provider may also give you more specific instructions. If you have problems or questions, contact your health care provider. What are the risks? Having heart failure puts you at higher risk for certain problems. These problems can get worse if you do not take good care of yourself. Problems may include:  Blood clotting problems. This may cause a stroke.  Damage to the kidneys, liver, or lungs.  Abnormal heart rhythms. Supplies needed:  Scale for monitoring weight.  Blood pressure monitor.  Notebook.  Medicines. How to care for yourself when you have heart failure Medicines Take over-the-counter and prescription medicines only as told by your health care provider. Medicines reduce the workload of your heart, slow the progression of heart failure, and improve symptoms. Take your medicines every day.  Do not stop taking your medicine unless your health care provider tells you to do so.  Do not skip any dose of medicine.  Refill your prescriptions before you run out of medicine. Eating and drinking   Eat heart-healthy foods. Talk with a dietitian to make an eating plan that is right for you. ? Choose foods that contain no trans fat and are low in saturated fat and cholesterol. Healthy choices include fresh or frozen fruits and vegetables, fish, lean meats, legumes, fat-free or low-fat dairy products, and whole-grain or high-fiber foods. ? Limit salt (sodium) if told by your health care provider. Sodium restriction may reduce  symptoms of heart failure. Ask a dietitian to recommend heart-healthy seasonings. ? Use healthy cooking methods instead of frying. Healthy methods include roasting, grilling, broiling, baking, poaching, steaming, and stir-frying.  Limit your fluid intake, if directed by your health care provider. Fluid restriction may reduce symptoms of heart failure. Alcohol use  Do not drink alcohol if: ? Your health care provider tells you not to drink. ? Your heart was damaged by alcohol, or you have severe heart failure. ? You are pregnant, may be pregnant, or are planning to become pregnant.  If you drink alcohol: ? Limit how much you use to:  0-1 drink a day for women.  0-2 drinks a day for men. ? Be aware of how much alcohol is in your drink. In the [place] one drink equals one 12 oz bottle of beer (355 mL), one 5 oz glass of wine (148 mL), or one 1 oz glass of hard liquor (44 mL). Lifestyle   Do not use any products that contain nicotine or tobacco, such as cigarettes, e-cigarettes, and chewing tobacco. If you need help quitting, ask your health care provider. ? Do not use nicotine gum or patches before talking to your health care provider.  Do not use illegal drugs.  Work with your health care provider to safely reach the right body weight.  Do physical activity if told by your health care provider. Talk to your health care provider before you begin an exercise if: ? You are an older adult. ? You have severe heart failure.  Learn to manage stress. If you need help to do this, ask  your health care provider.  Participate in or seek rehabilitation as needed to keep or improve your independence and quality of life.  Plan rest periods when you get tired. Monitoring important information   Weigh yourself every day. This will help you to notice if too much fluid is building up in your body. ? Weigh yourself every morning after you urinate and before you eat breakfast. ? Wear the same  amount of clothing each time you weigh yourself. ? Record your daily weight. Provide your health care provider with your weight record.  Monitor and record your pulse and blood pressure as told by your health care provider. Dealing with extreme temperatures  If the weather is extremely hot: ? Avoid vigorous physical activity. ? Use air conditioning or fans, or find a cooler location. ? Avoid caffeine and alcohol. ? Wear loose-fitting, lightweight, and light-colored clothing.  If the weather is extremely cold: ? Avoid vigorous activity. ? Layer your clothes. ? Wear mittens or gloves, a hat, and a scarf when you go outside. ? Avoid alcohol. Follow these instructions at home:  Stay up to date with vaccines. Pneumococcal and flu (influenza) vaccines are especially important in preventing infections of the airways.  Keep all follow-up visits as told by your health care provider. This is important. Contact a health care provider if you:  Have a rapid weight gain.  Have increasing shortness of breath.  Are unable to participate in your usual physical activities.  Get tired easily.  Cough more than normal, especially with physical activity.  Lose your appetite or feel nauseous.  Have any swelling or more swelling in areas such as your hands, feet, ankles, or abdomen.  Are unable to sleep because it is hard to breathe.  Feel like your heart is beating quickly (palpitations).  Become dizzy or light-headed when you stand up. Get help right away if you:  Have trouble breathing.  Notice or your family notices a change in your awareness, such as having trouble staying awake or concentrating.  Have pain or discomfort in your chest.  Have an episode of fainting (syncope). These symptoms may represent a serious problem that is an emergency. Do not wait to see if the symptoms will go away. Get medical help right away. Call your local emergency services (911 in the [place] Do not  drive yourself to the hospital. Summary  Heart failure is a serious condition. To care for yourself, you may be asked to change your diet, take certain medicines, and make other lifestyle changes.  Take your medicines every day. Do not stop taking them unless your health care provider tells you to do so.  Eat heart-healthy foods, such as fresh or frozen fruits and vegetables, fish, lean meats, legumes, fat-free or low-fat dairy products, and whole-grain or high-fiber foods.  Ask your health care provider if you have any alcohol restrictions. You may have to stop drinking alcohol if you have severe heart failure.  Contact your health care provider if you notice problems, such as rapid weight gain or a fast heartbeat. Get help right away if you faint, or have chest pain or trouble breathing. This information is not intended to replace advice given to you by your health care provider. Make sure you discuss any questions you have with your health care provider. Document Revised: 02/12/2019 Document Reviewed: 02/13/2019 [employer] Patient Education  [place].

## 2020-01-06 NOTE — Interval H&P Note (Signed)
History and Physical Interval Note:  01/06/2020 9:31 AM  [name]  has presented today for surgery, with the diagnosis of HEART FAILURE and severe mitral regurgitation.  The various methods of treatment have been discussed with the patient and family. After consideration of risks, benefits and other options for treatment, the patient has consented to  Procedure(s): TRANSESOPHAGEAL ECHOCARDIOGRAM (TEE) (N/A) as a surgical intervention.  The patient's history has been reviewed, patient examined, no change in status, stable for surgery.  I have reviewed the patient's chart and labs.  Questions were answered to the patient's satisfaction.     Damin Salido

## 2020-01-06 NOTE — Progress Notes (Signed)
  Echocardiogram Echocardiogram Transesophageal has been performed.  Jennette Dubin 01/06/2020, 10:21 AM

## 2020-01-09 ENCOUNTER — Ambulatory Visit (INDEPENDENT_AMBULATORY_CARE_PROVIDER_SITE_OTHER): Payer: Medicare Other | Admitting: Cardiovascular Disease

## 2020-01-09 ENCOUNTER — Other Ambulatory Visit: Payer: Self-pay

## 2020-01-09 ENCOUNTER — Encounter: Payer: Self-pay | Admitting: Cardiovascular Disease

## 2020-01-09 VITALS — BP 140/68 | HR 73 | Ht 67.0 in | Wt 188.8 lb

## 2020-01-09 DIAGNOSIS — I6523 Occlusion and stenosis of bilateral carotid arteries: Secondary | ICD-10-CM | POA: Diagnosis not present

## 2020-01-09 DIAGNOSIS — I340 Nonrheumatic mitral (valve) insufficiency: Secondary | ICD-10-CM | POA: Diagnosis not present

## 2020-01-09 NOTE — Patient Instructions (Signed)
Please keep your appointments as scheduled! 

## 2020-01-09 NOTE — Progress Notes (Signed)
[facility]   MULTIDISCIPLINARY HEART VALVE TEAM  Date:  01/09/2020   ID:  [name], [name] May 02, 1932, MRN [id number]  PCP:  Tonia Ghent, MD   Chief Complaint  Patient presents with  . Shortness of Breath     HISTORY OF PRESENT ILLNESS: [name] is a 86 y.o. male who presents for evaluation of severe mitral regurgitation, referred by Dr Haroldine Laws.  The patient has a longstanding history of chronic ischemic heart disease.  He underwent initial coronary bypass surgery in 1996 and redo CABG in 2006.  Both surgeries were performed by [name].  Comorbid medical conditions include carotid artery disease status post carotid endarterectomy, permanent atrial fibrillation, history of GI bleeding now resolved since undergoing left hemicolectomy, and history of vein graft PCI.  The patient has developed progressive symptoms of exertional dyspnea and fatigue and was referred for advanced heart failure consultation with Dr. Haroldine Laws in January of this year.  He has been noted to have moderate LV systolic dysfunction with akinesis of the inferior wall, moderately reduced RV function, severe pulmonary hypertension, and moderately severe mitral regurgitation.  Medical therapy has been limited because of stage IV chronic kidney disease.  Dr. Kae Heller performed a transesophageal echo and right heart catheterization studies to evaluate the patient's mitral regurgitation and hemodynamic assessment of heart failure.  Transesophageal echo demonstrated mild LV systolic dysfunction with hypokinesis of the inferolateral wall, LVEF estimated 50 to 55%, mild hypokinesis of the right ventricle, and at least moderately severe central mitral regurgitation with some restriction of the posterior leaflet consistent with secondary mitral regurgitation.  The patient has had regular dental care and reports no problems.  Past Medical History:  Diagnosis Date  . Age-related macular degeneration,  wet, both eyes ([name])   . Anemia    Secondary to acute blood loss  . Anginal pain ([name])   . Arthritis    "mild in hands, knees, ankles" (11/13/2015)  . Atrial fibrillation ([name])    Consideration was given for atrial flutter ablation, but patient developed atrial fibrillation. Cardioversion was done. Dr. Caryl Comes decided to watch him clinically. November, 2011  . Atrial flutter [facility]) 07/2010   September, 2011   Hospital with PNA and cath done.Marland KitchenMarland KitchenCoumadin.  Atrial flutter ablation planned, but  pt. then had atrial fibrillation,/outpatient conversion 09/08/10..NSR..plan to follow..Dr. Caryl Comes  . CAD (coronary artery disease)    Catheterization, September, 2011,  grafts patent from redo CABG,, medical therapy of coronary disease, consideration to proceeding with atrial flutter ablation  . Carotid artery disease ([name])    Doppler 09/18/2009 - 49% bilateral stenoses  . Carotid artery disease ([name])    49% bilateral, Doppler, November, 2010  . Chronic kidney disease (CKD), stage III (moderate)   . Diabetic peripheral neuropathy ([name])   . Diverticulosis of colon with hemorrhage 2009   several unit diverticular bleed   . Ejection fraction    EF 60%, echo, 2009  //   EF 65%, echo, September, 2011  . Erectile dysfunction    Mild  . Gout   . History of blood transfusion "several"   related to diverticular bleeding  . Hyperlipidemia   . Hypertension   . Mitral regurgitation    Mild, echo, September, 2011  . NSTEMI (non-ST elevated myocardial infarction) ([name]) 07/2010   at [facility] with repeat cath, rec medical mgmt   . OSA on CPAP   . Personal history of colonic polyps   . PNA (pneumonia) 9/11   NSTEMI  at [name] with repeat cath, rec medical mgmt   . RBBB (right bundle branch block)   . Shoulder pain    "positional; better now" (11/13/2015)  . Skin cancer    R lower leg, per derm 2012  . Type II diabetes mellitus ([name])     Current Outpatient Medications  Medication Sig Dispense Refill  . apixaban  (ELIQUIS) 2.5 MG TABS tablet Take 1 tablet (2.5 mg total) by mouth 2 (two) times daily. 180 tablet 1  . aspirin 81 MG tablet Take 81 mg by mouth daily.    . BD INSULIN SYRINGE ULTRAFINE 31G X 5/16" 0.3 ML MISC USE DAILY AS INSTRUCTED 100 each 2  . cetirizine (ZYRTEC) 10 MG tablet Take 10 mg by mouth daily as needed for allergies.    . Cholecalciferol (VITAMIN D) 1000 UNITS capsule Take 1,000 Units by mouth daily.     . colchicine 0.6 MG tablet Take 1 tablet (0.6 mg total) by mouth daily as needed (Fri, Sat, Sun, Mon). (Patient taking differently: Take 0.6 mg by mouth 4 (four) times a week. )    . fish oil-omega-3 fatty acids 1000 MG capsule Take 1 g by mouth daily.     . furosemide (LASIX) 80 MG tablet Take 1 tablet (80 mg total) by mouth 2 (two) times daily. 60 tablet 5  . glucose blood (FREESTYLE LITE) test strip USE TO TEST BLOOD SUGAR ONCE DAILY AND AS NEEDED FOR DIABETES MELLITUS 300 each 3  . hydrALAZINE (APRESOLINE) 25 MG tablet Take 1 tablet (25 mg total) by mouth 2 (two) times daily. 60 tablet 3  . isosorbide mononitrate (IMDUR) 60 MG 24 hr tablet TAKE ONE AND ONE-HALF TABLETS (90 MG) IN THE MORNING AND 1 TABLET (60 MG) IN THE EVENING  (NEW INSTRUCTIONS) (Patient taking differently: Take 60-90 mg by mouth See admin instructions. Take 90 mg in the morning (1.5 tab) and 60 mg at night (1 tab)) 270 tablet 3  . LANTUS 100 UNIT/ML injection INJECT 20 TO 25 UNITS AT BEDTIME (Patient taking differently: Inject 20-25 Units into the skin at bedtime. ) 40 mL 3  . magnesium oxide (MAG-OX) 400 MG tablet Take 400 mg by mouth daily.     . metoprolol tartrate (LOPRESSOR) 50 MG tablet TAKE 1 TABLET TWICE A DAY (Patient taking differently: Take 50 mg by mouth 2 (two) times daily. ) 180 tablet 1  . Multiple Vitamin (MULTIVITAMIN WITH MINERALS) TABS Take 1 tablet by mouth daily.    . nitroGLYCERIN (NITROSTAT) 0.4 MG SL tablet Place 1 tablet (0.4 mg total) under the tongue every 5 (five) minutes as needed for  chest pain. 100 tablet 1  . Potassium Gluconate (K-99) 595 MG CAPS Take 595 mg by mouth daily.     . ranolazine (RANEXA) 500 MG 12 hr tablet TAKE 1 TABLET TWICE A DAY (Patient taking differently: Take 500 mg by mouth 2 (two) times daily. ) 180 tablet 2  . rosuvastatin (CRESTOR) 40 MG tablet Take 1 tablet (40 mg total) by mouth at bedtime. 90 tablet 3   No current facility-administered medications for this visit.    ALLERGIES:   Patient has no known allergies.   SOCIAL HISTORY:  The patient  reports that he has quit smoking. His smoking use included cigarettes. He has a 8.00 pack-year smoking history. He has never used smokeless tobacco. He reports that he does not drink alcohol or use drugs.   FAMILY HISTORY:  The patient's family history includes Arthritis in  his sister; Cancer in his sister; Diabetes in his mother and sister; Heart disease in his father and sister; Kidney disease in his mother; Stroke in his mother.   REVIEW OF SYSTEMS:  Hx of GI bleeding, none recently and tolerating anticoagulation with apixaban, genralized fatigue and exercise intolerance. Otherwise negative except as outlined above.   PHYSICAL EXAM: VS:  BP 140/68   Pulse 73   Ht 5\' 7"  (1.702 m)   Wt 188 lb 12.8 oz (85.6 kg)   SpO2 95%   BMI 29.57 kg/m  , BMI Body mass index is 29.57 kg/m. GEN: Well nourished, well developed, in no acute distress HEENT: normal Neck: No JVD. carotids 2+ without bruits or masses Cardiac: The heart is irregular with a 2/6 systolic murmur at the RUSB and 3/6 holosystolic murmur at the apex. Trace BL pretibial edema. Pedal pulses 2+ = bilaterally  Respiratory:  clear to auscultation bilaterally GI: soft, nontender, nondistended, + BS MS: no deformity or atrophy Skin: warm and dry, no rash Neuro:  Strength and sensation are intact Psych: euthymic mood, full affect   RECENT LABS: 02/01/2019: TSH 3.89 06/21/2019: ALT 16 12/09/2019: B Natriuretic Peptide 468.9 01/01/2020: Platelets  143 01/06/2020: BUN 39; Creatinine, Ser 2.40; Hemoglobin 12.2; Potassium 3.9; Sodium 141  06/21/2019: Cholesterol 102; HDL 25.20; LDL Cholesterol 56; Total CHOL/HDL Ratio 4; Triglycerides 106.0; VLDL 21.2   Estimated Creatinine Clearance: 23.5 mL/min (A) (by C-G formula based on SCr of 2.4 mg/dL (H)).   Wt Readings from Last 3 Encounters:  01/09/20 188 lb 12.8 oz (85.6 kg)  01/06/20 188 lb (85.3 kg)  01/01/20 188 lb 6.4 oz (85.5 kg)     CARDIAC STUDIES:  TEE: FINDINGS:  LEFT VENTRICLE: EF = 50-55%. Hypokinesis of the inferior wall  RIGHT VENTRICLE: Normal size. Mild hypokinesis  LEFT ATRIUM: Severely dialted  LEFT ATRIAL APPENDAGE: No thrombus.   RIGHT ATRIUM: Severely dialted  AORTIC VALVE:  Trileaflet. Mild calcification. Trivial AI. No significant AS  MITRAL VALVE:   Mildly calcified. Very mild prolapse of anterior leaflet and restriction of posterior leaflet. 3-4+ central/functional MR. Very mild diastolic flow reversal in PV  TRICUSPID VALVE: Normal. Mild TR  PULMONIC VALVE: Grossly normal. Trivial PR  INTERATRIAL SEPTUM: No PFO or ASD.  PERICARDIUM: No effusion  DESCENDING AORTA: Severe plaque   Cardiac Cath (RHC) Findings:  RA = 10 RV = 58/8 PA = 58/19 (33) PCW = 20 (v waves to 35)  Fick cardiac output/index = 4.3/2.1 Thermo CO/CI = 3.2/1.7 PVR = 3.1 (Fick) 4.0 (thermo) Ao sat = 99% PA sat = 64%, 68%  Assessment:  1.  Moderately elevated biventricular pressures with low cardiac output  2.  Prominent v-waves on PCWP tracing c/w known severe MR  Plan/Discussion:  Will review possible Mitraclip with structural heart team.  Cardiac Cath 08-02-2018: Conclusion    Ost RCA to Prox RCA lesion is 100% stenosed.  Ost LAD to Prox LAD lesion is 100% stenosed.  Ost Cx lesion is 70% stenosed.  Ost 1st Mrg lesion is 90% stenosed.  Prox Cx lesion is 60% stenosed.  Origin to Prox Graft lesion is 100% stenosed.  Previously placed  Origin to Mid Graft stent (unknown type) is widely patent.    REVEL STELLMACH is a 86 y.o. male    [id number] LOCATION:  FACILITY: [facility]  PHYSICIAN: Quay Burow, M.D. August 16, 1932   DATE OF PROCEDURE:  08/02/2018  DATE OF DISCHARGE:     CARDIAC CATHETERIZATION /attempt at PCI and stenting  left circumflex/obtuse marginal branch.    History obtained from chart review.Zimir Kittleson Riddleis a 86 y.o.engaged Caucasian male formerly a patient of [name]. I last saw him in the office 05/23/2018. Mr. 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 His past medical history is remarkable for coronary artery bypass grafting in 1996 and again in 2006 by [name]. He had remote right carotid endarterectomy by [name] in 1994. History of hypertension, hyperlipidemia and diabetes. He was hospitalized in late July 2015 because of unstable angina. [name] performed cardiac catheterization revealing a high-grade obtuse marginal branch vein graft stenosis which was stented. Because of recurrent chest pain he was re-intervention relates later and found to have a new hazy lesion just proximal to the previously placed stent which was again intervened on. He was deemed a Plavix nonresponder and was begun on Brilenta. He also has a remote history of PAF as well as diverticulitis and has had GI bleed on Coumadin. His oral anticoagulation was discontinued.  He was recently admitted on 11/13/15 for a GI bleed. He underwent endoscopy revealing gastritis, colonoscopy revealing bleeding diverticula which was treated with clipping. He had 3 units of packed red blood cell transfusion. He did have chest pain and positive enzymes after his endoscopy within the echo that showed an EF of 50% with a new anterior wall motion and  reality. He was thought to have "a non-STEMI. His Plavix was discontinued. Because of his moderate renal insufficiency and his inability to take antiplatelet therapy he did not undergo repeat cardiac catheterization. He underwent left-sided hemicolectomy and sigmoidectomy because of chronic GI bleeding. This was uneventful. His hemoglobin has remained stablein the 12 range last checked in April. Since I saw him back 6 weeks ago he developed new onset progressive dyspnea and chest pain.  A Myoview stress test was performed that showed inferolateral car with mild peri-infarct ischemia new from prior Myoview and his EF was reduced as well compared to prior echoes with segmental wall motion abnormalities.  Because of his new onset dyspnea and chest pain and his new Myoview findings it was elected to proceed with diagnostic coronary angiography.    PROCEDURE DESCRIPTION:   The patient was brought to the second floor [facility]iac cath lab in the postabsorptive state. He was not  premedicated . His right groin was prepped and shaved in usual sterile fashion. Xylocaine 1% was used for local anesthesia. A 6 French sheath was inserted into the right femoral  artery using standard Seldinger technique.  5 French right and left Judkins diagnostic catheters along with a 5 French pigtail catheter used for selective coronary angiography, vein graft and IMA graft angiography.  Isovue dye was used for the entirety of the case (135 cc).  Retrograde aorta, left ventricular and pullback pressures were recorded.  I believe the changes in his anatomy reflects the occluded RCA vein graft which was patent and the cath on 2015 and progression of disease in the proximal circumflex and first obtuse marginal branch ostium.  Based on this, I decided to attempt PCI and stenting of his obtuse marginal branch and proximal AV groove circumflex and the patient received antiemetics bolus followed by infusion.  He Plavix  nonresponder and therefore received Brilinta.  Using an XB 3.5 cm guide catheter along with a 1 4 pro-water I  was able to cross the proximal circumflex stenosis and and advanced the wire into the distal AV groove.  I was unable despite multiple wires and using a "super cross" endhole catheter to access the origin of the obtuse marginal branch.  After multiple multiple attempts and 41 minutes of fluoroscopy time I elected to abort the procedure and not intervene.    IMPRESSION: Mr. Trost has an occluded RCA vein graft with left-to-right collaterals which is a new finding since his last cath 4 years ago.  Proximal circumflex and obtuse marginal branch have progressed but unfortunately was not able to cross the obtuse marginal branch ostium.  This came out from the AV groove had a right angle and was high-grade and calcified.  At this point, I recommend aggressive antianginal medical therapy.  Angiomax was discontinued.  Sheath will be removed and pressure held.  Patient be hydrated and discharged home in the morning.  He left the lab in stable condition.  Diagnostic Dominance: Right Left Anterior Descending  Ost LAD to Prox LAD lesion 100% stenosed  Ost LAD to Prox LAD lesion is 100% stenosed.  Left Circumflex  Ost Cx lesion 70% stenosed  Ost Cx lesion is 70% stenosed.  Prox Cx lesion 60% stenosed  Prox Cx lesion is 60% stenosed.  First Obtuse Marginal Branch  Ost 1st Mrg lesion 90% stenosed  Ost 1st Mrg lesion is 90% stenosed. The lesion is calcified.  Right Coronary Artery  Ost RCA to Prox RCA lesion 100% stenosed  Ost RCA to Prox RCA lesion is 100% stenosed.  Right Posterior Atrioventricular Artery  Collaterals  RPAV filled by collaterals from Mid LAD.    Graft To Dist RCA  Origin to Prox Graft lesion 100% stenosed  Origin to Prox Graft lesion is 100% stenosed.  Graft To 1st Diag  Origin to Mid Graft lesion 0% stenosed  Previously placed Origin to Mid Graft stent (unknown type) is  widely patent.  LIMA Graft To Mid LAD  Intervention  No interventions have been documented. Coronary Diagrams  Diagnostic Dominance: Right   STS RISK CALCULATOR: Mitral Valve Repair: Risk of Mortality: 14.822% Renal Failure: 25.636% Permanent Stroke: 8.120% Prolonged Ventilation: 23.607% DSW Infection: 0.339% Reoperation: 7.203% Morbidity or Mortality: 45.238% Short Length of Stay: 7.920% Long Length of Stay: 29.229%  Mitral Valve Replacement: Risk of Mortality: 13.531% Renal Failure: 18.285% Permanent Stroke: 5.694% Prolonged Ventilation: 25.707% DSW Infection: 0.650% Reoperation: 7.531% Morbidity or Mortality: 50.749% Short Length of Stay: 5.669% Long Length of Stay: 25.893%  ASSESSMENT AND PLAN: Severe nonrheumatic mitral valve insufficiency with chronic combined systolic and diastolic heart failure, NYHA functional Class 3b symptoms.   I have reviewed the natural history of mitral regurgitation with the patient and their family members who are present today. We have discussed the limitations of medical therapy and the poor prognosis associated with symptomatic mitral regurgitation. We have also reviewed potential treatment options, including palliative medical therapy, conventional surgical mitral valve repair or replacement, and percutaneous mitral valve therapies such as edge-to-edge mitral valve approximation with MitraClip. We discussed treatment options in the context of this patient's specific comorbid medical conditions.   I have personally reviewed the patient's cardiac catheterization and TEE data.  TEE findings are described above in the history of present illness.  He has mild segmental LV systolic dysfunction with inferolateral wall hypokinesis.  There is posterior leaflet restriction likely related to his ischemic heart disease.  There is severe central mitral valve insufficiency, I think primarily with Carpentier type  IIIb dysfunction.  There  is mild aortic stenosis with diffuse thickening and calcification of the aortic valve.  Cardiac catheterization from September 2019 demonstrate severe native vessel coronary artery disease with wide patency of the LIMA to LAD graft and saphenous vein graft to diagonal which has been previously stented.  The native RCA and saphenous vein graft to the RCA were occluded with left-to-right collaterals present.  There was severe calcific stenosis involving the left circumflex/first OM which is on grafted.  PCI was attempted but this was unsuccessful.  Medical therapy was recommended.  Considering the patient's progressive symptoms of heart failure with severe secondary mitral regurgitation and limited options for guideline directed medical therapy because of stage IV kidney disease, transcatheter edge-to-edge repair of the mitral valve would be a reasonable treatment option.  I reviewed this procedure in detail with the patient and his wife today.  We discussed available clinical trial data demonstrating benefit of transcatheter edge-to-edge mitral valve repair in patients with secondary mitral regurgitation.  The patient is counseled specifically about the risks, indications, and alternatives to percutaneous mitral valve repair with MitraClip. Specific risks include vascular injury, bleeding, infection, arrhythmia, myocardial infarction, stroke, cardiac perforation, cardiac tamponade, device embolization, single leaflet detachment, endocarditis, mitral valve injury, emergency surgery, and death. They understand the risk of serious complication occurs at a rate of approximately 2%.  They understand that he will need to undergo formal cardiac surgical consultation as part of a multidisciplinary approach to his care.  After undergoing cardiac surgical consultation, will perform heart team review and proceed with transcatheter edge to edge mitral valve repair if agreed upon by the heart team.   Signed, Sherren Mocha 01/09/2020 4:45 PM     [facility] [address] [place]  [id number]  [phone] (office) [phone] (fax)

## 2020-01-09 NOTE — H&P (View-Only) (Signed)
[facility]   MULTIDISCIPLINARY HEART VALVE TEAM  Date:  01/09/2020   ID:  Prathik, Aman 04/22/1932, MRN [id number]  PCP:  Tonia Ghent, MD   Chief Complaint  Patient presents with  . Shortness of Breath     HISTORY OF PRESENT ILLNESS: ISSAK GOLEY is a 86 y.o. male who presents for evaluation of severe mitral regurgitation, referred by Dr Haroldine Laws.  The patient has a longstanding history of chronic ischemic heart disease.  He underwent initial coronary bypass surgery in 1996 and redo CABG in 2006.  Both surgeries were performed by [name].  Comorbid medical conditions include carotid artery disease status post carotid endarterectomy, permanent atrial fibrillation, history of GI bleeding now resolved since undergoing left hemicolectomy, and history of vein graft PCI.  The patient has developed progressive symptoms of exertional dyspnea and fatigue and was referred for advanced heart failure consultation with Dr. Haroldine Laws in January of this year.  He has been noted to have moderate LV systolic dysfunction with akinesis of the inferior wall, moderately reduced RV function, severe pulmonary hypertension, and moderately severe mitral regurgitation.  Medical therapy has been limited because of stage IV chronic kidney disease.  Dr. Kae Heller performed a transesophageal echo and right heart catheterization studies to evaluate the patient's mitral regurgitation and hemodynamic assessment of heart failure.  Transesophageal echo demonstrated mild LV systolic dysfunction with hypokinesis of the inferolateral wall, LVEF estimated 50 to 55%, mild hypokinesis of the right ventricle, and at least moderately severe central mitral regurgitation with some restriction of the posterior leaflet consistent with secondary mitral regurgitation.  The patient has had regular dental care and reports no problems.  Past Medical History:  Diagnosis Date  . Age-related macular degeneration,  wet, both eyes ([place])   . Anemia    Secondary to acute blood loss  . Anginal pain ([place])   . Arthritis    "mild in hands, knees, ankles" (11/13/2015)  . Atrial fibrillation ([place])    Consideration was given for atrial flutter ablation, but patient developed atrial fibrillation. Cardioversion was done. Dr. Caryl Comes decided to watch him clinically. November, 2011  . Atrial flutter [facility]) 07/2010   September, 2011   Hospital with PNA and cath done.Marland KitchenMarland KitchenCoumadin.  Atrial flutter ablation planned, but  pt. then had atrial fibrillation,/outpatient conversion 09/08/10..NSR..plan to follow..Dr. Caryl Comes  . CAD (coronary artery disease)    Catheterization, September, 2011,  grafts patent from redo CABG,, medical therapy of coronary disease, consideration to proceeding with atrial flutter ablation  . Carotid artery disease ([place])    Doppler 09/18/2009 - 49% bilateral stenoses  . Carotid artery disease ([name])    49% bilateral, Doppler, November, 2010  . Chronic kidney disease (CKD), stage III (moderate)   . Diabetic peripheral neuropathy ([place])   . Diverticulosis of colon with hemorrhage 2009   several unit diverticular bleed   . Ejection fraction    EF 60%, echo, 2009  //   EF 65%, echo, September, 2011  . Erectile dysfunction    Mild  . Gout   . History of blood transfusion "several"   related to diverticular bleeding  . Hyperlipidemia   . Hypertension   . Mitral regurgitation    Mild, echo, September, 2011  . NSTEMI (non-ST elevated myocardial infarction) ([place]) 07/2010   at [facility] with repeat cath, rec medical mgmt   . OSA on CPAP   . Personal history of colonic polyps   . PNA (pneumonia) 9/11   NSTEMI  at [facility] with repeat cath, rec medical mgmt   . RBBB (right bundle branch block)   . Shoulder pain    "positional; better now" (11/13/2015)  . Skin cancer    R lower leg, per derm 2012  . Type II diabetes mellitus ([place])     Current Outpatient Medications  Medication Sig Dispense Refill  . apixaban  (ELIQUIS) 2.5 MG TABS tablet Take 1 tablet (2.5 mg total) by mouth 2 (two) times daily. 180 tablet 1  . aspirin 81 MG tablet Take 81 mg by mouth daily.    . BD INSULIN SYRINGE ULTRAFINE 31G X 5/16" 0.3 ML MISC USE DAILY AS INSTRUCTED 100 each 2  . cetirizine (ZYRTEC) 10 MG tablet Take 10 mg by mouth daily as needed for allergies.    . Cholecalciferol (VITAMIN D) 1000 UNITS capsule Take 1,000 Units by mouth daily.     . colchicine 0.6 MG tablet Take 1 tablet (0.6 mg total) by mouth daily as needed (Fri, Sat, Sun, Mon). (Patient taking differently: Take 0.6 mg by mouth 4 (four) times a week. )    . fish oil-omega-3 fatty acids 1000 MG capsule Take 1 g by mouth daily.     . furosemide (LASIX) 80 MG tablet Take 1 tablet (80 mg total) by mouth 2 (two) times daily. 60 tablet 5  . glucose blood (FREESTYLE LITE) test strip USE TO TEST BLOOD SUGAR ONCE DAILY AND AS NEEDED FOR DIABETES MELLITUS 300 each 3  . hydrALAZINE (APRESOLINE) 25 MG tablet Take 1 tablet (25 mg total) by mouth 2 (two) times daily. 60 tablet 3  . isosorbide mononitrate (IMDUR) 60 MG 24 hr tablet TAKE ONE AND ONE-HALF TABLETS (90 MG) IN THE MORNING AND 1 TABLET (60 MG) IN THE EVENING  (NEW INSTRUCTIONS) (Patient taking differently: Take 60-90 mg by mouth See admin instructions. Take 90 mg in the morning (1.5 tab) and 60 mg at night (1 tab)) 270 tablet 3  . LANTUS 100 UNIT/ML injection INJECT 20 TO 25 UNITS AT BEDTIME (Patient taking differently: Inject 20-25 Units into the skin at bedtime. ) 40 mL 3  . magnesium oxide (MAG-OX) 400 MG tablet Take 400 mg by mouth daily.     . metoprolol tartrate (LOPRESSOR) 50 MG tablet TAKE 1 TABLET TWICE A DAY (Patient taking differently: Take 50 mg by mouth 2 (two) times daily. ) 180 tablet 1  . Multiple Vitamin (MULTIVITAMIN WITH MINERALS) TABS Take 1 tablet by mouth daily.    . nitroGLYCERIN (NITROSTAT) 0.4 MG SL tablet Place 1 tablet (0.4 mg total) under the tongue every 5 (five) minutes as needed for  chest pain. 100 tablet 1  . Potassium Gluconate (K-99) 595 MG CAPS Take 595 mg by mouth daily.     . ranolazine (RANEXA) 500 MG 12 hr tablet TAKE 1 TABLET TWICE A DAY (Patient taking differently: Take 500 mg by mouth 2 (two) times daily. ) 180 tablet 2  . rosuvastatin (CRESTOR) 40 MG tablet Take 1 tablet (40 mg total) by mouth at bedtime. 90 tablet 3   No current facility-administered medications for this visit.    ALLERGIES:   Patient has no known allergies.   SOCIAL HISTORY:  The patient  reports that he has quit smoking. His smoking use included cigarettes. He has a 8.00 pack-year smoking history. He has never used smokeless tobacco. He reports that he does not drink alcohol or use drugs.   FAMILY HISTORY:  The patient's family history includes Arthritis in  his sister; Cancer in his sister; Diabetes in his mother and sister; Heart disease in his father and sister; Kidney disease in his mother; Stroke in his mother.   REVIEW OF SYSTEMS:  Hx of GI bleeding, none recently and tolerating anticoagulation with apixaban, genralized fatigue and exercise intolerance. Otherwise negative except as outlined above.   PHYSICAL EXAM: VS:  BP 140/68   Pulse 73   Ht 5\' 7"  (1.702 m)   Wt 188 lb 12.8 oz (85.6 kg)   SpO2 95%   BMI 29.57 kg/m  , BMI Body mass index is 29.57 kg/m. GEN: Well nourished, well developed, in no acute distress HEENT: normal Neck: No JVD. carotids 2+ without bruits or masses Cardiac: The heart is irregular with a 2/6 systolic murmur at the RUSB and 3/6 holosystolic murmur at the apex. Trace BL pretibial edema. Pedal pulses 2+ = bilaterally  Respiratory:  clear to auscultation bilaterally GI: soft, nontender, nondistended, + BS MS: no deformity or atrophy Skin: warm and dry, no rash Neuro:  Strength and sensation are intact Psych: euthymic mood, full affect   RECENT LABS: 02/01/2019: TSH 3.89 06/21/2019: ALT 16 12/09/2019: B Natriuretic Peptide 468.9 01/01/2020: Platelets  143 01/06/2020: BUN 39; Creatinine, Ser 2.40; Hemoglobin 12.2; Potassium 3.9; Sodium 141  06/21/2019: Cholesterol 102; HDL 25.20; LDL Cholesterol 56; Total CHOL/HDL Ratio 4; Triglycerides 106.0; VLDL 21.2   Estimated Creatinine Clearance: 23.5 mL/min (A) (by C-G formula based on SCr of 2.4 mg/dL (H)).   Wt Readings from Last 3 Encounters:  01/09/20 188 lb 12.8 oz (85.6 kg)  01/06/20 188 lb (85.3 kg)  01/01/20 188 lb 6.4 oz (85.5 kg)     CARDIAC STUDIES:  TEE: FINDINGS:  LEFT VENTRICLE: EF = 50-55%. Hypokinesis of the inferior wall  RIGHT VENTRICLE: Normal size. Mild hypokinesis  LEFT ATRIUM: Severely dialted  LEFT ATRIAL APPENDAGE: No thrombus.   RIGHT ATRIUM: Severely dialted  AORTIC VALVE:  Trileaflet. Mild calcification. Trivial AI. No significant AS  MITRAL VALVE:   Mildly calcified. Very mild prolapse of anterior leaflet and restriction of posterior leaflet. 3-4+ central/functional MR. Very mild diastolic flow reversal in PV  TRICUSPID VALVE: Normal. Mild TR  PULMONIC VALVE: Grossly normal. Trivial PR  INTERATRIAL SEPTUM: No PFO or ASD.  PERICARDIUM: No effusion  DESCENDING AORTA: Severe plaque   Cardiac Cath (RHC) Findings:  RA = 10 RV = 58/8 PA = 58/19 (33) PCW = 20 (v waves to 35)  Fick cardiac output/index = 4.3/2.1 Thermo CO/CI = 3.2/1.7 PVR = 3.1 (Fick) 4.0 (thermo) Ao sat = 99% PA sat = 64%, 68%  Assessment:  1.  Moderately elevated biventricular pressures with low cardiac output  2.  Prominent v-waves on PCWP tracing c/w known severe MR  Plan/Discussion:  Will review possible Mitraclip with structural heart team.  Cardiac Cath 08-02-2018: Conclusion    Ost RCA to Prox RCA lesion is 100% stenosed.  Ost LAD to Prox LAD lesion is 100% stenosed.  Ost Cx lesion is 70% stenosed.  Ost 1st Mrg lesion is 90% stenosed.  Prox Cx lesion is 60% stenosed.  Origin to Prox Graft lesion is 100% stenosed.  Previously placed  Origin to Mid Graft stent (unknown type) is widely patent.    DEWEY VIENS is a 86 y.o. male    [id number] LOCATION:  FACILITY: [facility]  PHYSICIAN: Quay Burow, M.D. 1931-12-29   DATE OF PROCEDURE:  08/02/2018  DATE OF DISCHARGE:     CARDIAC CATHETERIZATION /attempt at PCI and stenting  left circumflex/obtuse marginal branch.    History obtained from chart review.Ezequias Lard Riddleis a 86 y.o.engaged Caucasian male formerly a patient of [name]. I last saw him in the office 05/23/2018. Mr. 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 His past medical history is remarkable for coronary artery bypass grafting in 1996 and again in 2006 by [name]. He had remote right carotid endarterectomy by [name] in 1994. History of hypertension, hyperlipidemia and diabetes. He was hospitalized in late July 2015 because of unstable angina. [name] performed cardiac catheterization revealing a high-grade obtuse marginal branch vein graft stenosis which was stented. Because of recurrent chest pain he was re-intervention relates later and found to have a new hazy lesion just proximal to the previously placed stent which was again intervened on. He was deemed a Plavix nonresponder and was begun on Brilenta. He also has a remote history of PAF as well as diverticulitis and has had GI bleed on Coumadin. His oral anticoagulation was discontinued.  He was recently admitted on 11/13/15 for a GI bleed. He underwent endoscopy revealing gastritis, colonoscopy revealing bleeding diverticula which was treated with clipping. He had 3 units of packed red blood cell transfusion. He did have chest pain and positive enzymes after his endoscopy within the echo that showed an EF of 50% with a new anterior wall motion and  reality. He was thought to have "a non-STEMI. His Plavix was discontinued. Because of his moderate renal insufficiency and his inability to take antiplatelet therapy he did not undergo repeat cardiac catheterization. He underwent left-sided hemicolectomy and sigmoidectomy because of chronic GI bleeding. This was uneventful. His hemoglobin has remained stablein the 12 range last checked in April. Since I saw him back 6 weeks ago he developed new onset progressive dyspnea and chest pain.  A Myoview stress test was performed that showed inferolateral car with mild peri-infarct ischemia new from prior Myoview and his EF was reduced as well compared to prior echoes with segmental wall motion abnormalities.  Because of his new onset dyspnea and chest pain and his new Myoview findings it was elected to proceed with diagnostic coronary angiography.    PROCEDURE DESCRIPTION:   The patient was brought to the second floor [facility] cath lab in the postabsorptive state. He was not  premedicated . His right groin was prepped and shaved in usual sterile fashion. Xylocaine 1% was used for local anesthesia. A 6 French sheath was inserted into the right femoral  artery using standard Seldinger technique.  5 French right and left Judkins diagnostic catheters along with a 5 French pigtail catheter used for selective coronary angiography, vein graft and IMA graft angiography.  Isovue dye was used for the entirety of the case (135 cc).  Retrograde aorta, left ventricular and pullback pressures were recorded.  I believe the changes in his anatomy reflects the occluded RCA vein graft which was patent and the cath on 2015 and progression of disease in the proximal circumflex and first obtuse marginal branch ostium.  Based on this, I decided to attempt PCI and stenting of his obtuse marginal branch and proximal AV groove circumflex and the patient received antiemetics bolus followed by infusion.  He Plavix  nonresponder and therefore received Brilinta.  Using an XB 3.5 cm guide catheter along with a 1 4 pro-water I  was able to cross the proximal circumflex stenosis and and advanced the wire into the distal AV groove.  I was unable despite multiple wires and using a "super cross" endhole catheter to access the origin of the obtuse marginal branch.  After multiple multiple attempts and 41 minutes of fluoroscopy time I elected to abort the procedure and not intervene.    IMPRESSION: Mr. Nohr has an occluded RCA vein graft with left-to-right collaterals which is a new finding since his last cath 4 years ago.  Proximal circumflex and obtuse marginal branch have progressed but unfortunately was not able to cross the obtuse marginal branch ostium.  This came out from the AV groove had a right angle and was high-grade and calcified.  At this point, I recommend aggressive antianginal medical therapy.  Angiomax was discontinued.  Sheath will be removed and pressure held.  Patient be hydrated and discharged home in the morning.  He left the lab in stable condition.  Diagnostic Dominance: Right Left Anterior Descending  Ost LAD to Prox LAD lesion 100% stenosed  Ost LAD to Prox LAD lesion is 100% stenosed.  Left Circumflex  Ost Cx lesion 70% stenosed  Ost Cx lesion is 70% stenosed.  Prox Cx lesion 60% stenosed  Prox Cx lesion is 60% stenosed.  First Obtuse Marginal Branch  Ost 1st Mrg lesion 90% stenosed  Ost 1st Mrg lesion is 90% stenosed. The lesion is calcified.  Right Coronary Artery  Ost RCA to Prox RCA lesion 100% stenosed  Ost RCA to Prox RCA lesion is 100% stenosed.  Right Posterior Atrioventricular Artery  Collaterals  RPAV filled by collaterals from Mid LAD.    Graft To Dist RCA  Origin to Prox Graft lesion 100% stenosed  Origin to Prox Graft lesion is 100% stenosed.  Graft To 1st Diag  Origin to Mid Graft lesion 0% stenosed  Previously placed Origin to Mid Graft stent (unknown type) is  widely patent.  LIMA Graft To Mid LAD  Intervention  No interventions have been documented. Coronary Diagrams  Diagnostic Dominance: Right   STS RISK CALCULATOR: Mitral Valve Repair: Risk of Mortality: 14.822% Renal Failure: 25.636% Permanent Stroke: 8.120% Prolonged Ventilation: 23.607% DSW Infection: 0.339% Reoperation: 7.203% Morbidity or Mortality: 45.238% Short Length of Stay: 7.920% Long Length of Stay: 29.229%  Mitral Valve Replacement: Risk of Mortality: 13.531% Renal Failure: 18.285% Permanent Stroke: 5.694% Prolonged Ventilation: 25.707% DSW Infection: 0.650% Reoperation: 7.531% Morbidity or Mortality: 50.749% Short Length of Stay: 5.669% Long Length of Stay: 25.893%  ASSESSMENT AND PLAN: Severe nonrheumatic mitral valve insufficiency with chronic combined systolic and diastolic heart failure, NYHA functional Class 3b symptoms.   I have reviewed the natural history of mitral regurgitation with the patient and their family members who are present today. We have discussed the limitations of medical therapy and the poor prognosis associated with symptomatic mitral regurgitation. We have also reviewed potential treatment options, including palliative medical therapy, conventional surgical mitral valve repair or replacement, and percutaneous mitral valve therapies such as edge-to-edge mitral valve approximation with MitraClip. We discussed treatment options in the context of this patient's specific comorbid medical conditions.   I have personally reviewed the patient's cardiac catheterization and TEE data.  TEE findings are described above in the history of present illness.  He has mild segmental LV systolic dysfunction with inferolateral wall hypokinesis.  There is posterior leaflet restriction likely related to his ischemic heart disease.  There is severe central mitral valve insufficiency, I think primarily with Carpentier type  IIIb dysfunction.  There  is mild aortic stenosis with diffuse thickening and calcification of the aortic valve.  Cardiac catheterization from September 2019 demonstrate severe native vessel coronary artery disease with wide patency of the LIMA to LAD graft and saphenous vein graft to diagonal which has been previously stented.  The native RCA and saphenous vein graft to the RCA were occluded with left-to-right collaterals present.  There was severe calcific stenosis involving the left circumflex/first OM which is on grafted.  PCI was attempted but this was unsuccessful.  Medical therapy was recommended.  Considering the patient's progressive symptoms of heart failure with severe secondary mitral regurgitation and limited options for guideline directed medical therapy because of stage IV kidney disease, transcatheter edge-to-edge repair of the mitral valve would be a reasonable treatment option.  I reviewed this procedure in detail with the patient and his wife today.  We discussed available clinical trial data demonstrating benefit of transcatheter edge-to-edge mitral valve repair in patients with secondary mitral regurgitation.  The patient is counseled specifically about the risks, indications, and alternatives to percutaneous mitral valve repair with MitraClip. Specific risks include vascular injury, bleeding, infection, arrhythmia, myocardial infarction, stroke, cardiac perforation, cardiac tamponade, device embolization, single leaflet detachment, endocarditis, mitral valve injury, emergency surgery, and death. They understand the risk of serious complication occurs at a rate of approximately 2%.  They understand that he will need to undergo formal cardiac surgical consultation as part of a multidisciplinary approach to his care.  After undergoing cardiac surgical consultation, will perform heart team review and proceed with transcatheter edge to edge mitral valve repair if agreed upon by the heart team.   Signed, Sherren Mocha 01/09/2020 4:45 PM     [facility] [address] [place]  [address]  [phone] (office) [phone] (fax)

## 2020-01-10 ENCOUNTER — Encounter: Payer: Self-pay | Admitting: Thoracic Surgery (Cardiothoracic Vascular Surgery)

## 2020-01-10 ENCOUNTER — Institutional Professional Consult (permissible substitution) (INDEPENDENT_AMBULATORY_CARE_PROVIDER_SITE_OTHER): Payer: Medicare Other | Admitting: Thoracic Surgery (Cardiothoracic Vascular Surgery)

## 2020-01-10 VITALS — BP 125/62 | HR 50 | Temp 97.7°F | Resp 20 | Ht 67.0 in | Wt 188.0 lb

## 2020-01-10 DIAGNOSIS — Z951 Presence of aortocoronary bypass graft: Secondary | ICD-10-CM | POA: Diagnosis not present

## 2020-01-10 DIAGNOSIS — I340 Nonrheumatic mitral (valve) insufficiency: Secondary | ICD-10-CM | POA: Diagnosis not present

## 2020-01-10 DIAGNOSIS — I5042 Chronic combined systolic (congestive) and diastolic (congestive) heart failure: Secondary | ICD-10-CM

## 2020-01-10 DIAGNOSIS — I6523 Occlusion and stenosis of bilateral carotid arteries: Secondary | ICD-10-CM | POA: Diagnosis not present

## 2020-01-10 NOTE — Patient Instructions (Signed)
Continue all previous medications without any changes at this time  

## 2020-01-10 NOTE — Progress Notes (Signed)
[facility]  MULTIDISCIPLINARY HEART VALVE CLINIC  CARDIOTHORACIC SURGERY CONSULTATION REPORT  Referring Provider is Bensimhon, Shaune Pascal, MD Primary Cardiologist is Quay Burow, MD PCP is Tonia Ghent, MD  Chief Complaint  Patient presents with  . Consult    Surgical eval, for MitralClip,  Cardiac Cath and TEE  01/06/20, ECHO 06/25/19    HPI:  Patient is an 86 year old male with longstanding history of coronary artery disease status post coronary artery bypass grafting in 1996 and status post redo coronary artery bypass grafting in 2006 and ischemic cardiomyopathy with chronic combined systolic and diastolic congestive heart failure who has been referred for surgical consultation to discuss treatment options for management of severe symptomatic mitral regurgitation.  Comorbid medical problems include cerebrovascular disease status post carotid endarterectomy, longstanding persistent atrial fibrillation on long-term anticoagulation using Eliquis, history of recurrent GI bleeding which has resolved since patient underwent colectomy for diverticular disase, longstanding insulin-dependent type 2 diabetes mellitus with complications, stage III chronic kidney disease, hypertension, limited physical mobility, and poor eyesight.  Patient's cardiac history dates back to 1996 when he first presented with symptomatic angina pectoris and underwent coronary artery bypass grafting.  He did well for many years but required redo coronary artery bypass grafting in 2006.  Both procedures were performed by [name].  The patient again did quite well for several years until 2015 when he developed unstable angina.  Catheterization at that time revealed high-grade stenosis of the left circumflex coronary artery which was treated with PCI and stenting.  He had a early stent failure requiring another intervention shortly thereafter.  In 2016 he began to have recurrent episodes of GI bleeding.  He  was found to have both gastritis as well as bleeding diverticuli.  He developed recurrent lower GI bleeding and ultimately required sigmoid colectomy because of recurrent GI bleeding.  In 2019 he developed recurrent chest pain and dyspnea.  Catheterization at that time revealed newly occluded vein graft to the right coronary artery with chronic occlusion of the native right coronary artery.  There also remained high-grade ostial stenosis of obtuse marginal branch.  Left internal mammary artery remain patent to the distal left anterior descending coronary artery and saphenous vein graft to a large diagonal branch also remain patent.  Attempts at PCI and stenting of left circumflex were unsuccessful.  The patient remained stable until the past year when he began to experience worsening symptoms of congestive heart failure.  Echocardiogram August 2020 revealed moderate left ventricular systolic dysfunction with ejection fraction estimated 40 to 45%.  There was akinesis of the mid and apical inferior walls as well as hypokinesis of the inferolateral walls, all consistent with previous infarction in the right coronary artery and/or left circumflex territory.  There was moderately reduced right ventricular function and at least moderate mitral regurgitation.  Symptoms persisted despite optimal medical therapy and the patient was eventually referred to the advanced heart failure clinic in January of this year where he was evaluated by Dr. Haroldine Laws.  TEE was performed January 06, 2020 and revealed moderate left ventricular systolic dysfunction with ejection fraction estimated 50 to 55% in the setting of severe mitral regurgitation.  There was hypokinesis of the inferior wall.  There was severe mitral regurgitation with some diastolic flow reversal in the pulmonary veins.  There was severe left atrial enlargement.  Right ventricular size appeared normal.  There was mild hypokinesis of the right ventricle.  Right atrium  was severely enlarged.  Right heart  catheterization was performed demonstrating moderately elevated biventricular pressures with low cardiac output and prominent V waves on pulmonary capillary wedge tracing consistent with severe mitral regurgitation.  The patient was referred to the multidisciplinary heart valve clinic and has been evaluated previously by Dr. Burt Knack.  Cardiothoracic surgical consultation was requested.  Patient is a widower but engaged to be remarried.  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  He is now retired although he still manages several rental properties.  He does not drive an automobile because of very poor eyesight.  He otherwise remains entirely functionally independent.  His mobility is limited by degenerative arthritis in both knees and severe exertional shortness of breath and fatigue.  He states that he can only walk about 400 feet without stopping.  He describes tightness across his chest with ambulation.  He does not get resting chest discomfort or shortness of breath.  He reports some lower extremity edema although this has improved in the last few weeks since his dose of Lasix was increased.  Past Medical History:  Diagnosis Date  . Age-related macular degeneration, wet, both eyes ([place])   . Anemia    Secondary to acute blood loss  . Anginal pain ([place])   . Arthritis    "mild in hands, knees, ankles" (11/13/2015)  . Atrial fibrillation ([place])    Consideration was given for atrial flutter ablation, but patient developed atrial fibrillation. Cardioversion was done. Dr. Caryl Comes decided to watch him clinically. November, 2011  . Atrial flutter [facility]) 07/2010   September, 2011   Hospital with PNA and cath done.Marland KitchenMarland KitchenCoumadin.  Atrial flutter ablation planned, but  pt. then had atrial fibrillation,/outpatient conversion 09/08/10..NSR..plan to follow..Dr. Caryl Comes  . CAD (coronary artery disease)    Catheterization,  September, 2011,  grafts patent from redo CABG,, medical therapy of coronary disease, consideration to proceeding with atrial flutter ablation  . Carotid artery disease ([place])    Doppler 09/18/2009 - 49% bilateral stenoses  . Carotid artery disease ([name])    49% bilateral, Doppler, November, 2010  . Chronic kidney disease (CKD), stage III (moderate)   . Diabetic peripheral neuropathy ([place])   . Diverticulosis of colon with hemorrhage 2009   several unit diverticular bleed   . Ejection fraction    EF 60%, echo, 2009  //   EF 65%, echo, September, 2011  . Erectile dysfunction    Mild  . Gout   . History of blood transfusion "several"   related to diverticular bleeding  . Hyperlipidemia   . Hypertension   . Mitral regurgitation    Mild, echo, September, 2011  . NSTEMI (non-ST elevated myocardial infarction) ([place]) 07/2010   at [facility] with repeat cath, rec medical mgmt   . OSA on CPAP   . Personal history of colonic polyps   . PNA (pneumonia) 9/11   NSTEMI at [facility] with repeat cath, rec medical mgmt   . RBBB (right bundle branch block)   . Shoulder pain    "positional; better now" (11/13/2015)  . Skin cancer    R lower leg, per derm 2012  . Type II diabetes mellitus ([place])     Past Surgical History:  Procedure Laterality Date  . CARDIAC CATHETERIZATION  2006   Nuclear..slight lateral ischemia..medical therapy  . CARDIAC CATHETERIZATION  08/04/2010   grafts patent from redo CABG...medical Rx and ablate Atrial flutter (LV not injected)   . CARDIOVERSION  ~ 2010  . CAROTID  ENDARTERECTOMY Right 1994  . CATARACT EXTRACTION W/ INTRAOCULAR LENS  IMPLANT, BILATERAL Bilateral   . COLON RESECTION N/A 01/13/2016   Procedure: EXPLORATORY LAPAROTOMY, LEFT AND SIGMOID COLON REMOVAL;  Surgeon: Ralene Ok, MD;  Location: [place];  Service: General;  Laterality: N/A;  Extended open left hemicolectomy and sigmoidectomy   . COLONOSCOPY N/A 11/14/2015   Procedure: COLONOSCOPY;  Surgeon: Manus Gunning, MD;  Location: [facility] ENDOSCOPY;  Service: Gastroenterology;  Laterality: N/A;  . COLONOSCOPY Left 01/05/2016   Procedure: COLONOSCOPY;  Surgeon: Manus Gunning, MD;  Location: [place];  Service: Gastroenterology;  Laterality: Left;  no sedation to start, moderate if needed  . COLONOSCOPY W/ POLYPECTOMY    . CORONARY ARTERY BYPASS GRAFT  1995; 2006   "X 3; X3"  . [place], 2006  . DOPPLER ECHOCARDIOGRAPHY  08/2008   EF 60%  . DOPPLER ECHOCARDIOGRAPHY  08/02/2010   65-70%  . DOPPLER ECHOCARDIOGRAPHY  07/2010   MR mild  . ESOPHAGOGASTRODUODENOSCOPY N/A 06/19/2014   Procedure: ESOPHAGOGASTRODUODENOSCOPY (EGD);  Surgeon: Jerene Bears, MD;  Location[facility] ENDOSCOPY;  Service: Endoscopy;  Laterality: N/A;  . ESOPHAGOGASTRODUODENOSCOPY N/A 11/14/2015   Procedure: ESOPHAGOGASTRODUODENOSCOPY (EGD);  Surgeon: Manus Gunning, MD;  Location: [place];  Service: Gastroenterology;  Laterality: N/A;  . FLEXIBLE SIGMOIDOSCOPY N/A 11/16/2015   Procedure: FLEXIBLE SIGMOIDOSCOPY;  Surgeon: Manus Gunning, MD;  Location: [place];  Service: Gastroenterology;  Laterality: N/A;  . LAPAROSCOPIC CHOLECYSTECTOMY  2008  . LEFT HEART CATH N/A 06/14/2014   Procedure: LEFT HEART CATH;  Surgeon: Sinclair Grooms, MD;  Location: [facility] CATH LAB;  Service: Cardiovascular;  Laterality: N/A;  . LEFT HEART CATH AND CORONARY ANGIOGRAPHY N/A 08/02/2018   Procedure: LEFT HEART CATH AND CORONARY ANGIOGRAPHY;  Surgeon: [name], MD;  Location: [place] CV LAB;  Service: Cardiovascular;  Laterality: N/A;  . LEFT HEART CATHETERIZATION WITH CORONARY/GRAFT ANGIOGRAM N/A 06/11/2014   Procedure: LEFT HEART CATHETERIZATION WITH Beatrix Fetters;  Surgeon: Sinclair Grooms, MD;  Location: [facility] CATH LAB;  Service: Cardiovascular;  Laterality: N/A;  . PERCUTANEOUS CORONARY STENT INTERVENTION (PCI-S) N/A 06/13/2014   Procedure: PERCUTANEOUS CORONARY STENT INTERVENTION  (PCI-S);  Surgeon: Sinclair Grooms, MD;  Location: [facility] CATH LAB;  Service: Cardiovascular;  Laterality: N/A;  . RIGHT HEART CATH N/A 01/06/2020   Procedure: RIGHT HEART CATH;  Surgeon: Jolaine Artist, MD;  Location: [place] CV LAB;  Service: Cardiovascular;  Laterality: N/A;  . SKIN CANCER EXCISION Left 10/2015   calf  . SKIN CANCER EXCISION Right 2014?   chest  . TEE WITHOUT CARDIOVERSION N/A 01/06/2020   Procedure: TRANSESOPHAGEAL ECHOCARDIOGRAM (TEE);  Surgeon: Jolaine Artist, MD;  Location: [facility] ENDOSCOPY;  Service: Cardiovascular;  Laterality: N/A;  . TONSILLECTOMY    . VASECTOMY      Family History  Problem Relation Age of Onset  . Kidney disease Mother        Kidney failure  . Stroke Mother   . Diabetes Mother   . Heart disease Father        MI  . Arthritis Sister   . Cancer Sister        Throat  . Heart disease Sister        MI  . Diabetes Sister   . Prostate cancer Neg Hx   . Colon cancer Neg Hx     Social History   Socioeconomic History  . Marital status: Widowed  Spouse name: Not on file  . Number of children: 2  . Years of education: Not on file  . Highest education level: Not on file  Occupational History  . 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 Smoking status: Former Smoker    Packs/day: 1.00    Years: 8.00    Pack years: 8.00    Types: Cigarettes  . Smokeless tobacco: Never Used  . Tobacco comment: "quit smoking cigarettes in 1958"  Substance and Sexual Activity  . Alcohol use: No    Alcohol/week: 0.0 standard drinks  . Drug use: No  . Sexual activity: Yes  Other Topics Concern  . Not on file  Social History Narrative   From [place].  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     Lives with girlfriend [name].  Widowed 12/2005.   Social Determinants of Health   Financial Resource Strain: Low Risk   . Difficulty of Paying Living Expenses: Not hard at all    Food Insecurity: No Food Insecurity  . Worried About Charity fundraiser in the Last Year: Never true  . Ran Out of Food in the Last Year: Never true  Transportation Needs: No Transportation Needs  . Lack of Transportation (Medical): No  . Lack of Transportation (Non-Medical): No  Physical Activity: Inactive  . Days of Exercise per Week: 0 days  . Minutes of Exercise per Session: 0 min  Stress: No Stress Concern Present  . Feeling of Stress : Only a little  Social Connections:   . Frequency of Communication with Friends and Family: Not on file  . Frequency of Social Gatherings with Friends and Family: Not on file  . Attends Religious Services: Not on file  . Active Member of Clubs or Organizations: Not on file  . Attends Archivist Meetings: Not on file  . Marital Status: Not on file  Intimate Partner Violence: Not At Risk  . Fear of Current or Ex-Partner: No  . Emotionally Abused: No  . Physically Abused: No  . Sexually Abused: No    Current Outpatient Medications  Medication Sig Dispense Refill  . apixaban (ELIQUIS) 2.5 MG TABS tablet Take 1 tablet (2.5 mg total) by mouth 2 (two) times daily. 180 tablet 1  . aspirin 81 MG tablet Take 81 mg by mouth daily.    . BD INSULIN SYRINGE ULTRAFINE 31G X 5/16" 0.3 ML MISC USE DAILY AS INSTRUCTED 100 each 2  . cetirizine (ZYRTEC) 10 MG tablet Take 10 mg by mouth daily as needed for allergies.    . Cholecalciferol (VITAMIN D) 1000 UNITS capsule Take 1,000 Units by mouth daily.     . colchicine 0.6 MG tablet Take 1 tablet (0.6 mg total) by mouth daily as needed (Fri, Sat, Sun, Mon). (Patient taking differently: Take 0.6 mg by mouth 4 (four) times a week. )    . fish oil-omega-3 fatty acids 1000 MG capsule Take 1 g by mouth daily.     . furosemide (LASIX) 80 MG tablet Take 1 tablet (80 mg total) by mouth 2 (two) times daily. 60 tablet 5  . glucose blood (FREESTYLE LITE) test strip USE TO TEST BLOOD SUGAR ONCE DAILY AND AS NEEDED  FOR DIABETES MELLITUS 300 each 3  . hydrALAZINE (APRESOLINE) 25 MG tablet Take 1 tablet (25 mg total) by mouth 2 (two) times daily. 60 tablet 3  . isosorbide mononitrate (IMDUR)  60 MG 24 hr tablet TAKE ONE AND ONE-HALF TABLETS (90 MG) IN THE MORNING AND 1 TABLET (60 MG) IN THE EVENING  (NEW INSTRUCTIONS) (Patient taking differently: Take 60-90 mg by mouth See admin instructions. Take 90 mg in the morning (1.5 tab) and 60 mg at night (1 tab)) 270 tablet 3  . LANTUS 100 UNIT/ML injection INJECT 20 TO 25 UNITS AT BEDTIME (Patient taking differently: Inject 20-25 Units into the skin at bedtime. ) 40 mL 3  . magnesium oxide (MAG-OX) 400 MG tablet Take 400 mg by mouth daily.     . metoprolol tartrate (LOPRESSOR) 50 MG tablet TAKE 1 TABLET TWICE A DAY (Patient taking differently: Take 50 mg by mouth 2 (two) times daily. ) 180 tablet 1  . Multiple Vitamin (MULTIVITAMIN WITH MINERALS) TABS Take 1 tablet by mouth daily.    . nitroGLYCERIN (NITROSTAT) 0.4 MG SL tablet Place 1 tablet (0.4 mg total) under the tongue every 5 (five) minutes as needed for chest pain. 100 tablet 1  . Potassium Gluconate (K-99) 595 MG CAPS Take 595 mg by mouth daily.     . ranolazine (RANEXA) 500 MG 12 hr tablet TAKE 1 TABLET TWICE A DAY (Patient taking differently: Take 500 mg by mouth 2 (two) times daily. ) 180 tablet 2  . rosuvastatin (CRESTOR) 40 MG tablet Take 1 tablet (40 mg total) by mouth at bedtime. 90 tablet 3   No current facility-administered medications for this visit.    No Known Allergies    Review of Systems:   General:  normal appetite, decreased energy, no weight gain, no weight loss, no fever  Cardiac:  + chest pain with exertion, no chest pain at rest, +SOB with exertion, no resting SOB, no PND, no orthopnea, no palpitations, + arrhythmia, + atrial fibrillation, + LE edema, no dizzy spells, no syncope  Respiratory:  + shortness of breath, no home oxygen, no productive cough, no dry cough, no bronchitis, no  wheezing, no hemoptysis, no asthma, no pain with inspiration or cough, + sleep apnea, + CPAP at night  GI:   no difficulty swallowing, no reflux, no frequent heartburn, no hiatal hernia, no abdominal pain, no constipation, no diarrhea, no hematochezia, no hematemesis, no melena  GU:   no dysuria,  no frequency, no urinary tract infection, no hematuria, no enlarged prostate, no kidney stones, + chronic kidney disease  Vascular:  no pain suggestive of claudication, + pain in feet, occasional leg cramps, no varicose veins, no DVT, no non-healing foot ulcer  Neuro:   no stroke, no TIA's, no seizures, no headaches, no temporary blindness one eye,  no slurred speech, + peripheral neuropathy, no chronic pain, no instability of gait, no memory/cognitive dysfunction  Musculoskeletal: + arthritis, + joint swelling, no myalgias, some difficulty walking, reduced mobility   Skin:   no rash, no itching, no skin infections, no pressure sores or ulcerations  Psych:   no anxiety, no depression, no nervousness, no unusual recent stress  Eyes:   + blurry vision, + floaters, no recent vision changes, + wears glasses or contacts  ENT:   + hearing loss, no loose or painful teeth, no dentures, last saw dentist September 2020  Hematologic:  + easy bruising, no abnormal bleeding, no clotting disorder, no frequent epistaxis  Endocrine:  + diabetes, does check CBG's at home           Physical Exam:   BP 125/62   Pulse (!) 50   Temp 97.7 F (  36.5 C) (Skin)   Resp 20   Ht 5\' 7"  (1.702 m)   Wt 188 lb (85.3 kg)   SpO2 96% Comment: RA  BMI 29.44 kg/m   General:  Mildly obese eldery male in NAD    HEENT:  Unremarkable   Neck:   no JVD, no bruits, no adenopathy   Chest:   clear to auscultation, symmetrical breath sounds, no wheezes, no rhonchi   CV:   RRR, grade II/VI blowing holosystolic murmur heard best at LV apex,  no diastolic murmur  Abdomen:  soft, non-tender, no masses   Extremities:  warm, well-perfused,  pulses not palpable, no LE edema  Rectal/GU  Deferred  Neuro:   Grossly non-focal and symmetrical throughout  Skin:   Clean and dry, no rashes, no breakdown   Diagnostic Tests:  TRANSESOPHAGEAL ECHOCARDIOGRAM   NAME:  ZAHID CARNEIRO                                  MRN:   [id number] DOB:  Mar 19, 1932                                 ADMIT DATE: 01/06/2020  INDICATIONS:  Mitral regurgitation  PROCEDURE:   Informed consent was obtained prior to the procedure. The risks, benefits and alternatives for the procedure were discussed and the patient comprehended these risks.  Risks include, but are not limited to, cough, sore throat, vomiting, nausea, somnolence, esophageal and stomach trauma or perforation, bleeding, low blood pressure, aspiration, pneumonia, infection, trauma to the teeth and death.    After a procedural time-out, the patient was given 2 mg versed and 50 mcg fentanyl for moderate sedation.  The oropharynx was anesthetized with cetacaine spray.  The transesophageal probe was inserted in the esophagus and stomach without difficulty and multiple views were obtained.    COMPLICATIONS:    There were no immediate complications.  FINDINGS:  LEFT VENTRICLE: EF = 50-55%. Hypokinesis of the inferior wall  RIGHT VENTRICLE: Normal size. Mild hypokinesis  LEFT ATRIUM: Severely dialted  LEFT ATRIAL APPENDAGE: No thrombus.   RIGHT ATRIUM: Severely dialted  AORTIC VALVE:  Trileaflet. Mild calcification. Trivial AI. No significant AS  MITRAL VALVE:   Mildly calcified. Very mild prolapse of anterior leaflet and restriction of posterior leaflet. 3-4+ central/functional MR. Very mild diastolic flow reversal in PV  TRICUSPID VALVE: Normal. Mild TR  PULMONIC VALVE: Grossly normal. Trivial PR  INTERATRIAL SEPTUM: No PFO or ASD.  PERICARDIUM: No effusion  DESCENDING AORTA: Severe plaque   Daniel Bensimhon,MD 10:00 AM    RIGHT HEART CATH   Conclusion  Findings:  RA = 10 RV = 58/8 PA = 58/19 (33) PCW = 20 (v waves to 35)  Fick cardiac output/index = 4.3/2.1 Thermo CO/CI = 3.2/1.7 PVR = 3.1 (Fick) 4.0 (thermo) Ao sat = 99% PA sat = 64%, 68%  Assessment:  1.  Moderately elevated biventricular pressures with low cardiac output  2.  Prominent v-waves on PCWP tracing c/w known severe MR  Plan/Discussion:  Will review possible Mitraclip with structural heart team.  Glori Bickers, MD  11:16 AM       ECHOCARDIOGRAM REPORT       Patient Name:  SEIJI WISWELL Date of Exam: 06/25/2019  Medical Rec #: [id number]   Height:    66.0 in  Accession #:  [id number]   Weight:    194.4 lb  Date of Birth: [birth year]   BSA:     1.98 m  Patient Age:  35 years    BP:      124/64 mmHg  Patient Gender: M       HR:      70 bpm.  Exam Location: [place]     Procedure: 2D Echo, Cardiac Doppler, Color Doppler and Intracardiac       Opacification Agent   Indications:  I51.9 LV Dysfunction    History:    Patient has prior history of Echocardiogram examinations,  most         recent 07/10/2018. Prior Cardiac Surgery and Remote Right  Carotid         Endarterectomy Risk Factors: Hypertension, Diabetes and         Dyslipidemia. Atrial Fibrillation.    Sonographer:  Wilford Sports Rodgers-Jones RDCS  Referring Phys: [place]    1. The left ventricle has mild-moderately reduced systolic function, with  an ejection fraction of 40-45%. The cavity size was normal. Left  ventricular diastolic function could not be evaluated secondary to atrial  fibrillation. Elevated left ventricular  end-diastolic pressure.  2. There is akinesis of the mid and apical inferior walls and hypokinesis  of the inferolateral walls consistent with prior infarct in the RCA or  left Cx territory.  3. The right ventricle  has moderately reduced systolic function. The  cavity was normal. There is no increase in right ventricular wall  thickness. Right ventricular systolic pressure is severely elevated at  68mmHg .  4. Left atrial size was mildly dilated.  5. Mild thickening of the mitral valve leaflet. Mild calcification of the  mitral valve leaflet. There is moderate mitral annular calcification  present. Mitral valve regurgitation is moderate by color flow Doppler.  6. The aortic valve has an indeterminate number of cusps. Moderate  thickening of the aortic valve. Severe calcifcation of the aortic valve.  Aortic valve regurgitation is mild by color flow Doppler.  7. The inferior vena cava was normal in size with <50% respiratory  variability.  8. The interatrial septum appears to be lipomatous.  9. The aorta is normal in size and structure.   FINDINGS  Left Ventricle: The left ventricle has mild-moderately reduced systolic  function, with an ejection fraction of 40-45%. The cavity size was normal.  There is no increase in left ventricular wall thickness. Left ventricular  diastolic function could not be  evaluated secondary to atrial fibrillation. Elevated left ventricular  end-diastolic pressure Definity contrast agent was given IV to delineate  the left ventricular endocardial borders. There is akinesis of the mid and  apical inferior walls and  hypokinesis of the inferolateral walls consistent with prior infarct in  the RCA or left Cx territory.   Right Ventricle: The right ventricle has moderately reduced systolic  function. The cavity was normal. There is no increase in right ventricular  wall thickness. Right ventricular systolic pressure is severely elevated.   Left Atrium: Left atrial size was mildly dilated.   Right Atrium: Right atrial size was normal in size.   Interatrial Septum: No atrial level shunt detected by color flow Doppler.  Increased thickness of the atrial septum  sparing the fossa ovalis  consistent with The interatrial septum appears to be lipomatous.   Pericardium: There is no evidence of pericardial effusion.   Mitral Valve: The mitral valve is normal in  structure. Mild thickening of  the mitral valve leaflet. Mild calcification of the mitral valve leaflet.  There is moderate mitral annular calcification present. Mitral valve  regurgitation is moderate by color  flow Doppler.   Tricuspid Valve: The tricuspid valve is normal in structure. Tricuspid  valve regurgitation is mild by color flow Doppler.   Aortic Valve: The aortic valve has an indeterminate number of cusps  Moderate thickening of the aortic valve. Severe calcifcation of the aortic  valve, with moderately decreased cusp excursion. Aortic valve  regurgitation is mild by color flow Doppler.   Pulmonic Valve: The pulmonic valve was normal in structure. Pulmonic valve  regurgitation is trivial by color flow Doppler.   Aorta: The aorta is normal in size and structure.   Venous: The inferior vena cava measures 2.08 cm, is normal in size with  less than 50% respiratory variability.     +--------------+--------++  LEFT VENTRICLE     +----------------+---------++  +--------------+--------++ Diastology          PLAX 2D         +----------------+---------++  +--------------+--------++ LV e' lateral: 5.55 cm/s  LVIDd:    4.70 cm  +----------------+---------++  +--------------+--------++ LV E/e' lateral:28.8     LVIDs:    3.80 cm  +----------------+---------++  +--------------+--------++ LV e' medial:  5.16 cm/s  LV PW:    1.10 cm  +----------------+---------++  +--------------+--------++ LV E/e' medial: 31.0     LV IVS:    1.10 cm  +----------------+---------++  +--------------+--------++  LVOT diam:  1.90 cm   +--------------+--------++  LV SV:    40 ml    +--------------+--------++   LV SV Index: 19.66    +--------------+--------++  LVOT Area:  2.84 cm  +--------------+--------++               +--------------+--------++   +---------------+---------++  RIGHT VENTRICLE       +---------------+---------++  RV Basal diam: 3.60 cm   +---------------+---------++  RV S prime:  8.27 cm/s  +---------------+---------++  TAPSE (M-mode):1.0 cm    +---------------+---------++  RVSP:     73.3 mmHg  +---------------+---------++   +---------------+-------++-----------++  LEFT ATRIUM      Index     +---------------+-------++-----------++  LA diam:    5.50 cm2.78 cm/m   +---------------+-------++-----------++  LA Vol (A2C): 69.5 ml35.18 ml/m  +---------------+-------++-----------++  LA Vol (A4C): 73.0 ml36.95 ml/m  +---------------+-------++-----------++  LA Biplane Vol:71.8 ml36.34 ml/m  +---------------+-------++-----------++  +------------+---------++-----------++  RIGHT ATRIUM     Index     +------------+---------++-----------++  RA Pressure:8.00 mmHg        +------------+---------++-----------++  RA Area:  19.30 cm        +------------+---------++-----------++  RA Volume: 55.70 ml 28.19 ml/m  +------------+---------++-----------++  +------------------+------------++  AORTIC VALVE           +------------------+------------++  AV Area (Vmax):  1.39 cm    +------------------+------------++  AV Area (Vmean): 1.26 cm    +------------------+------------++  AV Area (VTI):  1.34 cm    +------------------+------------++  AV Vmax:     152.80 cm/s   +------------------+------------++  AV Vmean:     115.200 cm/s  +------------------+------------++  AV VTI:      0.311 m     +------------------+------------++  AV Peak Grad:   9.3 mmHg     +------------------+------------++  AV Mean Grad:   6.0 mmHg    +------------------+------------++  LVOT Vmax:    74.87 cm/s   +------------------+------------++  LVOT Vmean:    51.067 cm/s   +------------------+------------++  LVOT VTI:     0.147 m     +------------------+------------++  LVOT/AV VTI ratio:0.47      +------------------+------------++    +-------------+-------++  AORTA          +-------------+-------++  Ao Root diam:3.30 cm  +-------------+-------++  Ao Asc diam: 3.70 cm  +-------------+-------++   +-------------+-----------++ +---------------+-----------++  MR Peak grad:152.6 mmHg  TRICUSPID VALVE        +-------------+-----------++ +---------------+-----------++  MR Mean grad:101.0 mmHg  TR Peak grad: 65.3 mmHg   +-------------+-----------++ +---------------+-----------++  MR Vmax:   617.75 cm/s TR Vmax:    404.00 cm/s  +-------------+-----------++ +---------------+-----------++  MR Vmean:  475.8 cm/s  Estimated RAP: 8.00 mmHg   +-------------+-----------++ +---------------+-----------++  +--------------+-----------++ RVSP:     73.3 mmHg   MV E velocity:160.00 cm/s +---------------+-----------++  +--------------+-----------++                +--------------+-------+                SHUNTS                        +--------------+-------+                Systemic VTI: 0.15 m                 +--------------+-------+                Systemic Diam:1.90 cm                +--------------+-------+   +---------+-------+  IVC         +---------+-------+  IVC diam:2.08 cm  +---------+-------+     Fransico Him MD  Electronically signed by Fransico Him MD  Signature Date/Time: 06/25/2019/5:58:05 PM       LEFT HEART CATH AND CORONARY ANGIOGRAPHY  Conclusion    Ost RCA to Prox RCA lesion is 100% stenosed.  Ost LAD to Prox LAD lesion is 100% stenosed.  Ost Cx lesion is 70% stenosed.  Ost 1st Mrg lesion is 90% stenosed.  Prox Cx lesion is 60% stenosed.  Origin to Prox Graft lesion is 100% stenosed.  Previously placed Origin to Mid Graft stent (unknown type) is widely patent.    YAZEN ROSKO is a 86 y.o. male    [id number] LOCATION:  FACILITY: [facility]  PHYSICIAN: Quay Burow, M.D. 1932/07/09   DATE OF PROCEDURE:  08/02/2018  DATE OF DISCHARGE:     CARDIAC CATHETERIZATION /attempt at PCI and stenting left circumflex/obtuse marginal branch.    History obtained from chart review.Javan Gonzaga Riddleis a 86 y.o.engaged Caucasian male formerly a patient of [name]. I last saw him in the office 05/23/2018. Mr. 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 His past medical history is remarkable for coronary artery bypass grafting in 1996 and again in 2006 by [name]. He had remote right carotid endarterectomy by [name] in 1994. History of hypertension, hyperlipidemia and diabetes. He was hospitalized in late July 2015 because of unstable angina. [name] performed cardiac catheterization revealing a high-grade obtuse marginal branch vein graft stenosis which was stented. Because of recurrent chest pain he was re-intervention relates later and found to have a new hazy lesion just proximal to the previously placed stent which was again intervened on. He was deemed a  Plavix nonresponder and was begun on Brilenta. He also has a remote history of PAF as well as diverticulitis and has had GI bleed on Coumadin. His oral anticoagulation was discontinued.  He was recently admitted on  11/13/15 for a GI bleed. He underwent endoscopy revealing gastritis, colonoscopy revealing bleeding diverticula which was treated with clipping. He had 3 units of packed red blood cell transfusion. He did have chest pain and positive enzymes after his endoscopy within the echo that showed an EF of 50% with a new anterior wall motion and reality. He was thought to have "a non-STEMI. His Plavix was discontinued. Because of his moderate renal insufficiency and his inability to take antiplatelet therapy he did not undergo repeat cardiac catheterization. He underwent left-sided hemicolectomy and sigmoidectomy because of chronic GI bleeding. This was uneventful. His hemoglobin has remained stablein the 12 range last checked in April. Since I saw him back 6 weeks ago he developed new onset progressive dyspnea and chest pain.  A Myoview stress test was performed that showed inferolateral car with mild peri-infarct ischemia new from prior Myoview and his EF was reduced as well compared to prior echoes with segmental wall motion abnormalities.  Because of his new onset dyspnea and chest pain and his new Myoview findings it was elected to proceed with diagnostic coronary angiography.   PROCEDURE DESCRIPTION:   The patient was brought to the second floor  Cardiac cath lab in the postabsorptive state. He was not  premedicated . His right groin was prepped and shaved in usual sterile fashion. Xylocaine 1% was used for local anesthesia. A 6 French sheath was inserted into the right femoral  artery using standard Seldinger technique.  5 French right and left Judkins diagnostic catheters along with a 5 French pigtail catheter used for selective coronary angiography, vein graft and IMA graft angiography.  Isovue dye was used for the entirety of the case (135 cc).  Retrograde aorta, left ventricular and pullback pressures were recorded.  I believe the changes in his anatomy reflects the occluded RCA vein  graft which was patent and the cath on 2015 and progression of disease in the proximal circumflex and first obtuse marginal branch ostium.  Based on this, I decided to attempt PCI and stenting of his obtuse marginal branch and proximal AV groove circumflex and the patient received antiemetics bolus followed by infusion.  He Plavix nonresponder and therefore received Brilinta.  Using an XB 3.5 cm guide catheter along with a 1 4 pro-water I was able to cross the proximal circumflex stenosis and and advanced the wire into the distal AV groove.  I was unable despite multiple wires and using a "super cross" endhole catheter to access the origin of the obtuse marginal branch.  After multiple multiple attempts and 41 minutes of fluoroscopy time I elected to abort the procedure and not intervene.    IMPRESSION: Mr. Nunziata has an occluded RCA vein graft with left-to-right collaterals which is a new finding since his last cath 4 years ago.  Proximal circumflex and obtuse marginal branch have progressed but unfortunately was not able to cross the obtuse marginal branch ostium.  This came out from the AV groove had a right angle and was high-grade and calcified.  At this point, I recommend aggressive antianginal medical therapy.  Angiomax was discontinued.  Sheath will be removed and pressure held.  Patient be hydrated and discharged home in the morning.  He left the lab in stable condition.  Quay Burow.  MD, [facility] 08/02/2018 5:06 PM       Indications  Coronary artery disease due to lipid rich plaque [I25.10, I25.83 (ICD-10-CM)]  Procedural Details  Technical Details Estimated blood loss <50 mL.  During this procedure no sedation was administered.   Contrast  Medication Name Total Dose  iohexol (OMNIPAQUE) 350 MG/ML injection 135 mL    Radiation/Fluoro  Fluoro time: 41.9 (min) DAP: 485462 (mGycm2) Cumulative Air Kerma: 7035 (mGy)  Coronary Findings  Diagnostic Dominance: Right Left  Anterior Descending  Ost LAD to Prox LAD lesion 100% stenosed  Ost LAD to Prox LAD lesion is 100% stenosed.  Left Circumflex  Ost Cx lesion 70% stenosed  Ost Cx lesion is 70% stenosed.  Prox Cx lesion 60% stenosed  Prox Cx lesion is 60% stenosed.  First Obtuse Marginal Branch  Ost 1st Mrg lesion 90% stenosed  Ost 1st Mrg lesion is 90% stenosed. The lesion is calcified.  Right Coronary Artery  Ost RCA to Prox RCA lesion 100% stenosed  Ost RCA to Prox RCA lesion is 100% stenosed.  Right Posterior Atrioventricular Artery  Collaterals  RPAV filled by collaterals from Mid LAD.    Graft To Dist RCA  Origin to Prox Graft lesion 100% stenosed  Origin to Prox Graft lesion is 100% stenosed.  Graft To 1st Diag  Origin to Mid Graft lesion 0% stenosed  Previously placed Origin to Mid Graft stent (unknown type) is widely patent.  LIMA Graft To Mid LAD  Intervention  No interventions have been documented. Coronary Diagrams  Diagnostic Dominance: Right  Intervention      Impression:  Patient has ischemic cardiomyopathy with stage D severe symptomatic secondary mitral regurgitation which has failed medical therapy.  The patient also describes exertional chest tightness consistent with stable angina pectoris.  I have personally reviewed the patient's recent transesophageal echocardiogram and right heart catheterization.  I have also reviewed the patient's most recent diagnostic cardiac catheterization performed September 2019.  Transesophageal echocardiogram confirms the presence of moderate left ventricular systolic dysfunction with severe hypokinesis of the inferior and posterior lateral wall of the left ventricle.  The left ventricle size is not severely dilated.  There is severe mitral regurgitation.  Functional anatomy is consistent with type IIIb ischemic physiology with systolic restriction of the posterior leaflet.  There is severe left atrial enlargement.  Right heart catheterization  revealed moderately elevated right-sided pressures with large V waves on pulmonary capillary wedge tracing consistent with severe mitral regurgitation.  Cardiac output was mildly reduced.  Previous diagnostic cardiac catheterization revealed severe native coronary artery disease with continued patency of left internal mammary artery to the distal left anterior descending coronary artery as well as patent saphenous vein graft to the diagonal branch.  There is chronic occlusion of both the ostial right coronary artery as well as the old vein graft to the right coronary circulation.  There is high-grade proximal stenosis of a medium size obtuse marginal branch of the left circumflex.  I agree the patient might benefit from mitral valve repair.  However, I would not consider this elderly patient candidate for conventional surgical mitral valve repair or replacement with or without an attempt at redo coronary artery bypass grafting under any circumstances.  Based upon review of the patient's transesophageal echocardiogram the patient may be relatively good candidate for transcatheter edge-to-edge repair using MitraClip.   Plan:  The patient and his fiance were counseled at length regarding treatment alternatives for management of severe symptomatic mitral regurgitation.  Alternative approaches  such as conventional surgical mitral valve repair or replacement with or without third time redo coronary artery bypass grafting, percutaneous edge-to-edge Mitraclip repair, and continued medical therapy without intervention were compared and contrasted at length.  The risks associated with conventional surgery were discussed in detail, as were expectations for post-operative convalescence, and why I would not consider this patient a candidate for conventional surgery under any circumstances.  Issues specific to Mitraclip repair were discussed including questions about long term freedom from persistent or recurrent mitral  regurgitation, the potential for device migration or embolization, and other technical complications related to the procedure itself.  Current availability of other catheter-based treatments for mitral regurgitation was discussed.  Long-term prognosis with medical therapy was discussed. This discussion was placed in the context of the patient's own specific clinical presentation and past medical history.  All of their questions have been addressed.  The patient desires to proceed with transcatheter edge-to-edge repair as previously planned by Dr. Burt Knack.  All questions answered.    I spent in excess of 90 minutes during the conduct of this office consultation and >50% of this time involved direct face-to-face encounter with the patient for counseling and/or coordination of their care.    Valentina Gu. Roxy Manns, MD 01/10/2020 9:59 AM

## 2020-01-11 ENCOUNTER — Other Ambulatory Visit: Payer: Self-pay | Admitting: Cardiovascular Disease

## 2020-01-16 ENCOUNTER — Other Ambulatory Visit: Payer: Self-pay

## 2020-01-16 DIAGNOSIS — I340 Nonrheumatic mitral (valve) insufficiency: Secondary | ICD-10-CM

## 2020-01-17 NOTE — Progress Notes (Signed)
[place], [place] [address] [place] [address] Phone: [phone] Fax: [phone]  [id number] [place], [address] [place] [address] Phone: [phone] Fax: [phone]    Your procedure is scheduled on Thursday, March 11th  Report to [facility] Main Entrance "A" at 5:30 A.M., and check in at the Admitting office.  Call this number if you have problems the morning of surgery:  [phone]  Call [phone] if you have any questions prior to your surgery date Monday-Friday 8am-4pm   Remember:  Do not eat or drink after midnight the night before your surgery    Take these medicines the morning of surgery with A SIP OF WATER hydrALAZINE (APRESOLINE) isosorbide mononitrate (IMDUR)  metoprolol tartrate (LOPRESSOR) ranolazine (RANEXA)  If needed - cetirizine (ZYRTEC), nitroGLYCERIN (NITROSTAT),    Per surgeon's instructions:   1. Stop Eliquis on 01/21/2020.   2. Continue taking all other current medications including Aspirin without change through the day before surgery.   3. Please take half of your evening dose of Lantus (10 - 12 units) on Wednesday, 01/22/2020   As of today, STOP taking Aleve, Naproxen, Ibuprofen, Motrin, Advil, Goody's, BC's, all herbal medications, fish oil, and all vitamins.  HOW TO MANAGE YOUR DIABETES BEFORE AND AFTER SURGERY  Why is it important to control my blood sugar before and after surgery? . Improving blood sugar levels before and after surgery helps healing and can limit problems. . A way of improving blood sugar control is eating a healthy diet by: o  Eating less sugar and carbohydrates o  Increasing activity/exercise o  Talking with your doctor about reaching your blood sugar goals . High blood sugars (greater than 180 mg/dL) can raise your risk of infections and slow your recovery, so you will need to focus on controlling  your diabetes during the weeks before surgery. . Make sure that the doctor who takes care of your diabetes knows about your planned surgery including the date and location.  How do I manage my blood sugar before surgery? . Check your blood sugar at least 4 times a day, starting 2 days before surgery, to make sure that the level is not too high or low. . Check your blood sugar the morning of your surgery when you wake up and every 2 hours until you get to the Short Stay unit. o If your blood sugar is less than 70 mg/dL, you will need to treat for low blood sugar: - Do not take insulin. - Treat a low blood sugar (less than 70 mg/dL) with  cup of clear juice (cranberry or apple), 4 glucose tablets, OR glucose gel. - Recheck blood sugar in 15 minutes after treatment (to make sure it is greater than 70 mg/dL). If your blood sugar is not greater than 70 mg/dL on recheck, call [phone] for further instructions. . Report your blood sugar to the short stay nurse when you get to Short Stay.  . If you are admitted to the hospital after surgery: o Your blood sugar will be checked by the staff and you will probably be given insulin after surgery (instead of oral diabetes medicines) to make sure you have good blood sugar levels. o The goal for blood sugar control after surgery is 80-180 mg/dL.  The Morning of Surgery  Do not wear jewelry.  Do not wear lotions, powders, colognes, or deodorant  Men may shave  face and neck.  Do not bring valuables to the hospital.  The [facility] is not responsible for any belongings or valuables.  If you are a smoker, DO NOT Smoke 24 hours prior to surgery  If you wear a CPAP at night please bring your mask the morning of surgery   Remember that you must have someone to transport you home after your surgery, and remain with you for 24 hours if you are discharged the same day.  Please bring cases for contacts, glasses, hearing aids, dentures or bridgework because it  cannot be worn into surgery.   Leave your suitcase in the car.  After surgery it may be brought to your room.  For patients admitted to the hospital, discharge time will be determined by your treatment team.  Patients discharged the day of surgery will not be allowed to drive home.   Special instructions:   [facility]- Preparing For Surgery  Before surgery, you can play an important role. Because skin is not sterile, your skin needs to be as free of germs as possible. You can reduce the number of germs on your skin by washing with CHG (chlorahexidine gluconate) Soap before surgery.  CHG is an antiseptic cleaner which kills germs and bonds with the skin to continue killing germs even after washing.    Oral Hygiene is also important to reduce your risk of infection.  Remember - BRUSH YOUR TEETH THE MORNING OF SURGERY WITH YOUR REGULAR TOOTHPASTE  Please do not use if you have an allergy to CHG or antibacterial soaps. If your skin becomes reddened/irritated stop using the CHG.  Do not shave (including legs and underarms) for at least 48 hours prior to first CHG shower. It is OK to shave your face.  Please follow these instructions carefully.   1. Shower the NIGHT BEFORE SURGERY and the MORNING OF SURGERY with CHG Soap.   2. If you chose to wash your hair, wash your hair first as usual with your normal shampoo.  3. After you shampoo, rinse your hair and body thoroughly to remove the shampoo.  4. Use CHG as you would any other liquid soap. You can apply CHG directly to the skin and wash gently with a scrungie or a clean washcloth.   5. Apply the CHG Soap to your body ONLY FROM THE NECK DOWN.  Do not use on open wounds or open sores. Avoid contact with your eyes, ears, mouth and genitals (private parts). Wash Face and genitals (private parts)  with your normal soap.   6. Wash thoroughly, paying special attention to the area where your surgery will be performed.  7. Thoroughly rinse your  body with warm water from the neck down.  8. DO NOT shower/wash with your normal soap after using and rinsing off the CHG Soap.  9. Pat yourself dry with a CLEAN TOWEL.  10. Wear CLEAN PAJAMAS to bed the night before surgery, wear comfortable clothes the morning of surgery  11. Place CLEAN SHEETS on your bed the night of your first shower and DO NOT SLEEP WITH PETS.  Day of Surgery: Please shower the morning of surgery with the CHG soap Do not apply any deodorants/lotions. Please wear clean clothes to the hospital/surgery center.   Remember to brush your teeth WITH YOUR REGULAR TOOTHPASTE.  Please read over the following fact sheets that you were given.

## 2020-01-20 ENCOUNTER — Other Ambulatory Visit: Payer: Self-pay

## 2020-01-20 ENCOUNTER — Encounter (HOSPITAL_COMMUNITY): Payer: Self-pay

## 2020-01-20 ENCOUNTER — Ambulatory Visit (HOSPITAL_COMMUNITY)
Admission: RE | Admit: 2020-01-20 | Discharge: 2020-01-20 | Disposition: A | Discharge status: Home / Self Care | Payer: Medicare Other | Source: Ambulatory Visit | Attending: Cardiovascular Disease | Admitting: Cardiovascular Disease

## 2020-01-20 ENCOUNTER — Ambulatory Visit: Payer: Medicare Other | Attending: Cardiovascular Disease | Admitting: Physical Therapy

## 2020-01-20 ENCOUNTER — Other Ambulatory Visit (HOSPITAL_COMMUNITY)
Admission: RE | Admit: 2020-01-20 | Discharge: 2020-01-20 | Disposition: A | Discharge status: Home / Self Care | Payer: Medicare Other | Source: Ambulatory Visit | Attending: Cardiovascular Disease | Admitting: Cardiovascular Disease

## 2020-01-20 ENCOUNTER — Telehealth: Payer: Self-pay | Admitting: Cardiovascular Disease

## 2020-01-20 ENCOUNTER — Encounter (HOSPITAL_COMMUNITY)
Admission: RE | Admit: 2020-01-20 | Discharge: 2020-01-20 | Disposition: A | Discharge status: Home / Self Care | Payer: Medicare Other | Source: Ambulatory Visit | Attending: Cardiovascular Disease | Admitting: Cardiovascular Disease

## 2020-01-20 ENCOUNTER — Encounter: Payer: Self-pay | Admitting: Physical Therapy

## 2020-01-20 DIAGNOSIS — Z01810 Encounter for preprocedural cardiovascular examination: Secondary | ICD-10-CM | POA: Diagnosis not present

## 2020-01-20 DIAGNOSIS — Z950 Presence of cardiac pacemaker: Secondary | ICD-10-CM | POA: Insufficient documentation

## 2020-01-20 DIAGNOSIS — Z20822 Contact with and (suspected) exposure to covid-19: Secondary | ICD-10-CM | POA: Diagnosis not present

## 2020-01-20 DIAGNOSIS — I4891 Unspecified atrial fibrillation: Secondary | ICD-10-CM | POA: Diagnosis not present

## 2020-01-20 DIAGNOSIS — Z01818 Encounter for other preprocedural examination: Secondary | ICD-10-CM | POA: Diagnosis not present

## 2020-01-20 DIAGNOSIS — E119 Type 2 diabetes mellitus without complications: Secondary | ICD-10-CM | POA: Insufficient documentation

## 2020-01-20 DIAGNOSIS — I340 Nonrheumatic mitral (valve) insufficiency: Secondary | ICD-10-CM

## 2020-01-20 DIAGNOSIS — R262 Difficulty in walking, not elsewhere classified: Secondary | ICD-10-CM | POA: Diagnosis not present

## 2020-01-20 DIAGNOSIS — Z01812 Encounter for preprocedural laboratory examination: Secondary | ICD-10-CM | POA: Diagnosis not present

## 2020-01-20 DIAGNOSIS — I4510 Unspecified right bundle-branch block: Secondary | ICD-10-CM | POA: Insufficient documentation

## 2020-01-20 HISTORY — DX: Heart failure, unspecified: I50.9

## 2020-01-20 LAB — URINALYSIS, ROUTINE W REFLEX MICROSCOPIC
Bilirubin Urine: NEGATIVE
Glucose, UA: 50 mg/dL — AB
Hgb urine dipstick: NEGATIVE
Ketones, ur: NEGATIVE mg/dL
Leukocytes,Ua: NEGATIVE
Nitrite: NEGATIVE
Protein, ur: NEGATIVE mg/dL
Specific Gravity, Urine: 1.01 (ref 1.005–1.030)
pH: 6 (ref 5.0–8.0)

## 2020-01-20 LAB — BLOOD GAS, ARTERIAL
Acid-Base Excess: 4.4 mmol/L — ABNORMAL HIGH (ref 0.0–2.0)
Bicarbonate: 28.6 mmol/L — ABNORMAL HIGH (ref 20.0–28.0)
Drawn by: 421801
FIO2: 21
O2 Saturation: 98.7 %
Patient temperature: 37
pCO2 arterial: 44.7 mmHg (ref 32.0–48.0)
pH, Arterial: 7.422 (ref 7.350–7.450)
pO2, Arterial: 112 mmHg — ABNORMAL HIGH (ref 83.0–108.0)

## 2020-01-20 LAB — CBC
HCT: 38 % — ABNORMAL LOW (ref 39.0–52.0)
Hemoglobin: 12.1 g/dL — ABNORMAL LOW (ref 13.0–17.0)
MCH: 30.8 pg (ref 26.0–34.0)
MCHC: 31.8 g/dL (ref 30.0–36.0)
MCV: 96.7 fL (ref 80.0–100.0)
Platelets: 127 10*3/uL — ABNORMAL LOW (ref 150–400)
RBC: 3.93 MIL/uL — ABNORMAL LOW (ref 4.22–5.81)
RDW: 15.1 % (ref 11.5–15.5)
WBC: 6.5 10*3/uL (ref 4.0–10.5)
nRBC: 0 % (ref 0.0–0.2)

## 2020-01-20 LAB — COMPREHENSIVE METABOLIC PANEL
ALT: 23 U/L (ref 0–44)
AST: 25 U/L (ref 15–41)
Albumin: 3.6 g/dL (ref 3.5–5.0)
Alkaline Phosphatase: 73 U/L (ref 38–126)
Anion gap: 12 (ref 5–15)
BUN: 32 mg/dL — ABNORMAL HIGH (ref 8–23)
CO2: 25 mmol/L (ref 22–32)
Calcium: 9.7 mg/dL (ref 8.9–10.3)
Chloride: 100 mmol/L (ref 98–111)
Creatinine, Ser: 2.41 mg/dL — ABNORMAL HIGH (ref 0.61–1.24)
GFR calc Af Amer: 27 mL/min — ABNORMAL LOW (ref >60)
GFR calc non Af Amer: 24 mL/min — ABNORMAL LOW (ref >60)
Glucose, Bld: 398 mg/dL — ABNORMAL HIGH (ref 70–99)
Potassium: 4.2 mmol/L (ref 3.5–5.1)
Sodium: 137 mmol/L (ref 135–145)
Total Bilirubin: 1.1 mg/dL (ref 0.3–1.2)
Total Protein: 7.1 g/dL (ref 6.5–8.1)

## 2020-01-20 LAB — GLUCOSE, CAPILLARY: Glucose-Capillary: 280 mg/dL — ABNORMAL HIGH (ref 70–99)

## 2020-01-20 LAB — HEMOGLOBIN A1C
Hgb A1c MFr Bld: 8.9 % — ABNORMAL HIGH (ref 4.8–5.6)
Mean Plasma Glucose: 208.73 mg/dL

## 2020-01-20 LAB — BRAIN NATRIURETIC PEPTIDE: B Natriuretic Peptide: 568.4 pg/mL — ABNORMAL HIGH (ref 0.0–100.0)

## 2020-01-20 LAB — SURGICAL PCR SCREEN
MRSA, PCR: NEGATIVE
Staphylococcus aureus: NEGATIVE

## 2020-01-20 LAB — SARS CORONAVIRUS 2 (TAT 6-24 HRS): SARS Coronavirus 2: NEGATIVE

## 2020-01-20 NOTE — Telephone Encounter (Signed)
I spoke with the blood bank and advised that 2 Units is fine in preparation for 3/11 MitraClip procedure.

## 2020-01-20 NOTE — Telephone Encounter (Signed)
[name] with [name] states she is requesting to speak with Dr. Antionette Char nurse in regards to red blood cell antibodies. She states she would like to inquire about the number of units to crossmatch.   Please return call to advise at [phone].

## 2020-01-20 NOTE — Therapy (Signed)
[place], [place], [address] Phone: [phone]   Fax:  [phone]  Physical Therapy Evaluation  Patient Details  Name: [name] MRN: [id number] Date of Birth: Nov 11, 1932 Referring Provider (PT): Sherren Mocha, MD   Encounter Date: 01/20/2020  PT End of Session - 01/20/20 1229    Visit Number  1    PT Start Time  1150    PT Stop Time  1225    PT Time Calculation (min)  35 min    Activity Tolerance  Patient tolerated treatment well    Behavior During Therapy  [facility] for tasks assessed/performed       Past Medical History:  Diagnosis Date  . Age-related macular degeneration, wet, both eyes ([place])   . Anemia    Secondary to acute blood loss  . Anginal pain ([place])   . Arthritis    "mild in hands, knees, ankles" (11/13/2015)  . Atrial fibrillation ([place])    Consideration was given for atrial flutter ablation, but patient developed atrial fibrillation. Cardioversion was done. Dr. Caryl Comes decided to watch him clinically. November, 2011  . Atrial flutter [facility]) 07/2010   September, 2011   Hospital with PNA and cath done.Marland KitchenMarland KitchenCoumadin.  Atrial flutter ablation planned, but  pt. then had atrial fibrillation,/outpatient conversion 09/08/10..NSR..plan to follow..Dr. Caryl Comes  . CAD (coronary artery disease)    Catheterization, September, 2011,  grafts patent from redo CABG,, medical therapy of coronary disease, consideration to proceeding with atrial flutter ablation  . Carotid artery disease ([place])    Doppler 09/18/2009 - 49% bilateral stenoses  . Carotid artery disease ([name])    49% bilateral, Doppler, November, 2010  . Chronic kidney disease (CKD), stage III (moderate)   . Diabetic peripheral neuropathy ([place])   . Diverticulosis of colon with hemorrhage 2009   several unit diverticular bleed   . Ejection fraction    EF 60%, echo, 2009  //   EF 65%, echo, September, 2011  . Erectile dysfunction    Mild  . Gout   .  History of blood transfusion "several"   related to diverticular bleeding  . Hyperlipidemia   . Hypertension   . Mitral regurgitation    Mild, echo, September, 2011  . NSTEMI (non-ST elevated myocardial infarction) ([place]) 07/2010   at [facility] with repeat cath, rec medical mgmt   . OSA on CPAP   . Personal history of colonic polyps   . PNA (pneumonia) 9/11   NSTEMI at [facility] with repeat cath, rec medical mgmt   . RBBB (right bundle branch block)   . Shoulder pain    "positional; better now" (11/13/2015)  . Skin cancer    R lower leg, per derm 2012  . Type II diabetes mellitus ([place])     Past Surgical History:  Procedure Laterality Date  . CARDIAC CATHETERIZATION  2006   Nuclear..slight lateral ischemia..medical therapy  . CARDIAC CATHETERIZATION  08/04/2010   grafts patent from redo CABG...medical Rx and ablate Atrial flutter (LV not injected)   . CARDIOVERSION  ~ 2010  . CAROTID ENDARTERECTOMY Right 1994  . CATARACT EXTRACTION W/ INTRAOCULAR LENS  IMPLANT, BILATERAL Bilateral   . COLON RESECTION N/A 01/13/2016   Procedure: EXPLORATORY LAPAROTOMY, LEFT AND SIGMOID COLON REMOVAL;  Surgeon: Ralene Ok, MD;  Location: [place];  Service: General;  Laterality: N/A;  Extended open left hemicolectomy and sigmoidectomy   . COLONOSCOPY N/A 11/14/2015   Procedure: COLONOSCOPY;  Surgeon: Manus Gunning, MD;  Location:  [place] [facility];  Service: Gastroenterology;  Laterality: N/A;  . COLONOSCOPY Left 01/05/2016   Procedure: COLONOSCOPY;  Surgeon: Manus Gunning, MD;  Location: [place];  Service: Gastroenterology;  Laterality: Left;  no sedation to start, moderate if needed  . COLONOSCOPY W/ POLYPECTOMY    . CORONARY ARTERY BYPASS GRAFT  1995; 2006   "X 3; X3"  . [place], 2006  . DOPPLER ECHOCARDIOGRAPHY  08/2008   EF 60%  . DOPPLER ECHOCARDIOGRAPHY  08/02/2010   65-70%  . DOPPLER ECHOCARDIOGRAPHY  07/2010   MR mild  . ESOPHAGOGASTRODUODENOSCOPY N/A  06/19/2014   Procedure: ESOPHAGOGASTRODUODENOSCOPY (EGD);  Surgeon: Jerene Bears, MD;  Location: [facility] ENDOSCOPY;  Service: Endoscopy;  Laterality: N/A;  . ESOPHAGOGASTRODUODENOSCOPY N/A 11/14/2015   Procedure: ESOPHAGOGASTRODUODENOSCOPY (EGD);  Surgeon: Manus Gunning, MD;  Location: [place];  Service: Gastroenterology;  Laterality: N/A;  . FLEXIBLE SIGMOIDOSCOPY N/A 11/16/2015   Procedure: FLEXIBLE SIGMOIDOSCOPY;  Surgeon: Manus Gunning, MD;  Location: [place];  Service: Gastroenterology;  Laterality: N/A;  . LAPAROSCOPIC CHOLECYSTECTOMY  2008  . LEFT HEART CATH N/A 06/14/2014   Procedure: LEFT HEART CATH;  Surgeon: Sinclair Grooms, MD;  Location: [facility] CATH LAB;  Service: Cardiovascular;  Laterality: N/A;  . LEFT HEART CATH AND CORONARY ANGIOGRAPHY N/A 08/02/2018   Procedure: LEFT HEART CATH AND CORONARY ANGIOGRAPHY;  Surgeon: [name], MD;  Location: [place] CV LAB;  Service: Cardiovascular;  Laterality: N/A;  . LEFT HEART CATHETERIZATION WITH CORONARY/GRAFT ANGIOGRAM N/A 06/11/2014   Procedure: LEFT HEART CATHETERIZATION WITH Beatrix Fetters;  Surgeon: Sinclair Grooms, MD;  Location: [facility] CATH LAB;  Service: Cardiovascular;  Laterality: N/A;  . PERCUTANEOUS CORONARY STENT INTERVENTION (PCI-S) N/A 06/13/2014   Procedure: PERCUTANEOUS CORONARY STENT INTERVENTION (PCI-S);  Surgeon: Sinclair Grooms, MD;  Location: [facility] CATH LAB;  Service: Cardiovascular;  Laterality: N/A;  . RIGHT HEART CATH N/A 01/06/2020   Procedure: RIGHT HEART CATH;  Surgeon: Jolaine Artist, MD;  Location: [place] CV LAB;  Service: Cardiovascular;  Laterality: N/A;  . SKIN CANCER EXCISION Left 10/2015   calf  . SKIN CANCER EXCISION Right 2014?   chest  . TEE WITHOUT CARDIOVERSION N/A 01/06/2020   Procedure: TRANSESOPHAGEAL ECHOCARDIOGRAM (TEE);  Surgeon: Jolaine Artist, MD;  Location: [facility] ENDOSCOPY;  Service: Cardiovascular;  Laterality: N/A;  . TONSILLECTOMY    . VASECTOMY       There were no vitals filed for this visit.   Subjective Assessment - 01/20/20 1156    Subjective  Several years ago with a-fib. The severe fatigue began in last 6 months or so. SOB only with over-exertion: walking to mailbox and back which is about 400 feet or stairs. Hurts to get up after about 5 min.    Currently in Pain?  No/denies         [facility] PT Assessment - 01/20/20 0001      Assessment   Medical Diagnosis  severe mitral insuf.    Referring Provider (PT)  Sherren Mocha, MD    Hand Dominance  Right      Precautions   Precautions  None      Restrictions   Weight Bearing Restrictions  No      Balance Screen   Has the patient fallen in the past 6 months  No      [place] residence    Living Arrangements  Spouse/significant other  Additional Comments  stairs to outside garage, single steps all through house      Prior Function   Level of Independence  Independent      Cognition   Overall Cognitive Status  Within Functional Limits for tasks assessed      Sensation   Additional Comments  [facility]      Posture/Postural Control   Posture Comments  forward head, rounded shoulders      ROM / Strength   AROM / PROM / Strength  AROM;Strength      AROM   Overall AROM Comments  WFL      Strength   Overall Strength Comments  gross UE 4/5, Rt UE ER3+/5; gross LE 4+/5    Strength Assessment Site  Hand    Right/Left hand  Right;Left    Right Hand Grip (lbs)  50    Left Hand Grip (lbs)  30       [facility] Pre-Surgical Assessment - 01/20/20 0001    5 Meter Walk Test- trial 1  4 sec    5 Meter Walk Test- trial 2  4 sec.     5 Meter Walk Test- trial 3  4 sec.    5 meter walk test average  4 sec    comment  0- unable to place feet together    Comment  unable without UE    6 Minute Walk- Baseline  yes    BP (mmHg)  146/73    HR (bpm)  59    02 Sat (%RA)  98 %    Modified Borg Scale for Dyspnea  0- Nothing at all    Perceived  Rate of Exertion (Borg)  6-    6 Minute Walk Post Test  yes    BP (mmHg)  155/78    HR (bpm)  108    02 Sat (%RA)  96 %    Modified Borg Scale for Dyspnea  3- Moderate shortness of breath or breathing difficulty    Perceived Rate of Exertion (Borg)  15- Hard    Aerobic Endurance Distance Walked  641    Endurance additional comments  53% disability compared to age-related normative values              Objective measurements completed on examination: See above findings.                           Plan - 01/20/20 1230    PT Frequency  One time visit      Clinical Impression Statement: Pt is a 86 yo M presenting to OP PT for evaluation prior to possible Mitra Clip surgery due to severe mitral insufficiency. Pt reports onset of SOB many years ago with severe onset fatigue about 6 onths ago. Symptoms are  limiting ambulation endurance and stair climbing. Pt presents with WFL ROM and mild limitations in strength. Wide-spread musculoskeletal pain but not any right now.  Pt took 3 short, standing rest breaks through the test.Pt ambulated a total of 641 feet in 6 minute walk and reported 3/10 SOB on modified scale for dyspena and 15/20 RPE on Borg's perceived exertion and pain scale at the end of the walk. During the 6 minute walk test, patient's HR increased to 106 BPM and O2 saturation decreased to 95%. Based on the Short Physical Performance Battery, patient has a frailty rating of 4/12 with </= 5/12 considered frail.  Visit Diagnosis: Difficulty in walking, not elsewhere  classified     Problem List Patient Active Problem List   Diagnosis Date Noted  . DOE (dyspnea on exertion) 03/20/2019  . Anticoagulated 03/20/2019  . Unstable angina ([place])   . PAF (paroxysmal atrial fibrillation) ([place])   . Acute on chronic diastolic CHF (congestive heart failure), NYHA class 3 ([place])   . CAD (coronary artery disease) 08/02/2018  . Abnormal cardiovascular stress test   .  Macular degeneration 06/24/2018  . Health care maintenance 05/24/2017  . Abdominal wall hernia 05/24/2017  . DNR (do not resuscitate) discussion   . History of lower GI bleeding   . Diastolic CHF ([place]) 20/23/3435  . Chronic combined systolic and diastolic CHF (congestive heart failure) ([place])   . Pulmonary hypertension ([place])   . Acute on chronic diastolic (congestive) heart failure ([place]) 11/20/2015  . NSTEMI (non-ST elevated myocardial infarction) ([place])   . Acute-on-chronic kidney injury ([place])   . Gout 09/09/2014  . Medicare annual wellness visit, initial 09/09/2014  . Advance care planning 09/09/2014  . RBBB (right bundle branch block)   . Shoulder pain   . Carotid artery disease ([place])   . Hx of CABG   . Mitral regurgitation   . ED (erectile dysfunction) 03/01/2012  . CRI (chronic renal insufficiency), stage 4 (severe) ([place]) 03/24/2011  . Atrial flutter ([place]) 08/24/2010  . Peripheral sensory neuropathy due to type 2 diabetes mellitus ([place])   . GERD   . Sleep apnea   . Insulin dependent diabetes mellitus with complications   . HLD (hyperlipidemia)   . Hypertensive heart disease    Tyronn Golda C. Byanka Landrus PT, DPT 01/20/20 12:31 PM   [place], [place], [address] Phone: [phone]   Fax:  [phone]  Name: AURON TADROS MRN: [id number] Date of Birth: 10/19/1932

## 2020-01-20 NOTE — Progress Notes (Signed)
PCP - Dr. [name] Cardiologist - Dr. Adora Fridge  PPM/ICD - N/A Device Orders -N/A  Rep Notified - N/A  Chest x-ray - 01/20/20 EKG - 01/20/20 Stress Test -07/12/18  ECHO - 01/06/20 Cardiac Cath - 01/06/20  Sleep Study - OSA+  CPAP - uses nightly.  Fasting Blood Sugar -160-180  Checks Blood Sugar ___1__ time a day. CBG at PAT-280. Pt stated he ate breakfast at 0830. Last A1C was drawn on 06/21/19 and was 8.4. Will draw A1C today.   Blood Thinner Instructions:LD 01/20/20. Stop Eliquis as of 01/21/20 per cardiologist.  Aspirin Instructions: Continue up until day of surgery.   ERAS Protcol -N/A PRE-SURGERY Ensure or G2-N/A   COVID TEST- Pt will be tested after PAT appointment today. Pt aware of quarantine instructions.    Anesthesia review: Yes, heart history.   Patient denies shortness of breath, fever, cough and chest pain at PAT appointment   All instructions explained to the patient, with a verbal understanding of the material. Patient agrees to go over the instructions while at home for a better understanding. Patient also instructed to self quarantine after being tested for COVID-19. The opportunity to ask questions was provided.    Coronavirus Screening  Have you experienced the following symptoms:  Cough yes/no: No Fever (>100.47F)  yes/no: No Runny nose yes/no: No Sore throat yes/no: No Difficulty breathing/shortness of breath  yes/no: No  Have you or a family member traveled in the last 14 days and where? yes/no: No   If the patient indicates "YES" to the above questions, their PAT will be rescheduled to limit the exposure to others and, the surgeon will be notified. THE PATIENT WILL NEED TO BE ASYMPTOMATIC FOR 14 DAYS.   If the patient is not experiencing any of these symptoms, the PAT nurse will instruct them to NOT bring anyone with them to their appointment since they may have these symptoms or traveled as well.   Please remind your patients and families that  hospital visitation restrictions are in effect and the importance of the restrictions.

## 2020-01-20 NOTE — Telephone Encounter (Signed)
Informed Blood Bank that typically 2 units are held for procedure, but provided the Structural Heart Nurse Navigator's phone number and instructed them to call and confirm with her. She was grateful for assistance.

## 2020-01-21 ENCOUNTER — Encounter (HOSPITAL_COMMUNITY): Payer: Self-pay

## 2020-01-21 NOTE — Progress Notes (Signed)
Anesthesia Chart Review:  Case: [id number] Date/Time: 01/23/20 0730   Procedure: MITRAL VALVE REPAIR (N/A )   Anesthesia type: General   Pre-op diagnosis: Severe Mitral Insufficiency   Location: [facility] CATH LAB 6 / [place] INVASIVE CV LAB   Providers: Sherren Mocha, MD      DISCUSSION: Patient is an 86 year old male scheduled for the above procedure.  History includes former smoker, CAD (CABG 1996: SVG-PD-PL and SVG-CX; redo CABG: LIMA-LAD, SVG-DIAG, SVG-PDA 2006; MI 08/04/10 with occluded 1996 grafts, patent 2006 grafts; NSTEMI, s/p DES SVG-OM 06/13/14 and 06/14/14 for early stent failure/Plavix non-responder; occluded SVG-RCA with left-to-right collaterals and high grade OM and AV groove lesion with unsuccessful PCI attempt 08/03/19), combined systolic and diastolic CHF, afib/flutter, right BBB, severe MR, HTN, HLD, DM2 (with neuropathy), CKD (stage IV), carotid artery stenosis (right CEA 1994), macular degeneration, skin cancer, GI bleed (recurrent diverticular bleed; s/p left hemicolectomy and sigmoidectomy 01/13/16, perioperative MI 01/16/16), OSA (CPAP).  He is not felt to be a candidate for conventional surgical MVR with or without attempt at a second redo CABG per preoperative CT surgery evaluation by Darylene Price, MD on 01/10/20.   Labs are marked as reviewed by Dr. Burt Knack. Non-fasting glucose 398 at PAT (had just eaten breakfast before PAT). A1c 8.9%, up from 8.4% 7 months ago. Reported home CBG ~160-180. Creatinine 2.41, but known CKD stage IV with Cr 2.16-2.40 since 06/2019.   Last Eliquis 01/21/20. He is for PT/PTT on the day of surgery. Presurgical COVID-19 test negative on 01/20/2020.  Anesthesia team to evaluate on the day of surgery.   VS: BP 140/68   Pulse 64   Temp 36.4 C (Oral)   Resp 20   Ht 5\' 7"  (1.702 m)   Wt 85.1 kg   SpO2 98%   BMI 29.40 kg/m    PROVIDERS: Tonia Ghent, MD is PCP  Quay Burow, MD is primary cardiologist Glori Bickers, MD is HF cardiologist Elmarie Shiley, MD is nephrologist [facility])   LABS: Preoperative labs noted and are marked as reviewed by Dr. Burt Knack. A1c 8.9%, up from 8.4% 7 months ago. Creatinine 2.41, but consistent with prior results.  (all labs ordered are listed, but only abnormal results are displayed)  Labs Reviewed  GLUCOSE, CAPILLARY - Abnormal; Notable for the following components:      Result Value   Glucose-Capillary 280 (*)    All other components within normal limits  BLOOD GAS, ARTERIAL - Abnormal; Notable for the following components:   pO2, Arterial 112 (*)    Bicarbonate 28.6 (*)    Acid-Base Excess 4.4 (*)    Allens test (pass/fail) BRACHIAL ARTERY (*)    All other components within normal limits  BRAIN NATRIURETIC PEPTIDE - Abnormal; Notable for the following components:   B Natriuretic Peptide 568.4 (*)    All other components within normal limits  CBC - Abnormal; Notable for the following components:   RBC 3.93 (*)    Hemoglobin 12.1 (*)    HCT 38.0 (*)    Platelets 127 (*)    All other components within normal limits  COMPREHENSIVE METABOLIC PANEL - Abnormal; Notable for the following components:   Glucose, Bld 398 (*)    BUN 32 (*)    Creatinine, Ser 2.41 (*)    GFR calc non Af Amer 24 (*)    GFR calc Af Amer 27 (*)    All other components within normal limits  HEMOGLOBIN A1C - Abnormal; Notable for the  following components:   Hgb A1c MFr Bld 8.9 (*)    All other components within normal limits  URINALYSIS, ROUTINE W REFLEX MICROSCOPIC - Abnormal; Notable for the following components:   Glucose, UA 50 (*)    All other components within normal limits  SURGICAL PCR SCREEN  TYPE AND SCREEN     IMAGES: CXR 01/20/20: FINDINGS: The heart size and mediastinal contours are stable status post median sternotomy and CABG. There is aortic atherosclerosis. The lungs are clear. There is no pleural effusion or pneumothorax. Postsurgical changes are present within the right  neck. IMPRESSION: Stable postoperative chest. No acute cardiopulmonary process.   EKG: 01/20/20:  Ventricular rate 67 bpm Atrial fibrillation with a competing junctional pacemaker Right bundle branch block Left anterior fascicular block ** Bifascicular block ** Abnormal ECG No significant change since last tracing Confirmed by Skeet Latch [phone]) on 01/21/2020 8:37:29 AM   CV: TEE 01/06/20: IMPRESSIONS   1. Left ventricular ejection fraction, by estimation, is 50 to 55%. The  left ventricle has low normal function. The left ventricle demonstrates  regional wall motion abnormalities (see scoring diagram/findings for  description). There is hypokinesis of  the inferior wall.  2. Right ventricular systolic function is mildly reduced. The right  ventricular size is normal.  3. Left atrial size was severely dilated. No left atrial/left atrial  appendage thrombus was detected.  4. Right atrial size was severely dilated.  5. The aortic valve is tricuspid. Aortic valve regurgitation is trivial.  6. The mitral valve is abnormal. There is mild leaflet calcification.  With minimal prolapse of the anterior leaflet and mild restriction of the  posterior (P2) leaflet. There is severe central 3-4+ MR with 2 jets. Very  mild diastolic flow reversal in the  pulmonary veins. . The mitral valve is abnormal. Severe mitral valve  regurgitation. No evidence of mitral stenosis.  7. There is Severe (Grade IV) plaque.    [place] 01/06/20 (Bensimhon, Daniel, MD): Findings: RA = 10 RV = 58/8 PA = 58/19 (33) PCW = 20 (v waves to 35)  Fick cardiac output/index = 4.3/2.1 Thermo CO/CI = 3.2/1.7 PVR = 3.1 (Fick) 4.0 (thermo) Ao sat = 99% PA sat = 64%, 68% Assessment: 1.  Moderately elevated biventricular pressures with low cardiac output  2.  Prominent v-waves on PCWP tracing c/w known severe MR Plan/Discussion: Will review possible Mitraclip with structural heart team.   Carotid US  12/24/19: Summary:  Right Carotid: Velocities in the right ICA are consistent with a 1-39% stenosis.  Non-hemodynamically significant plaque <50% noted in the CCA.  Left Carotid: Velocities in the left ICA are consistent with a 1-39%stenosis. Non-hemodynamically significant plaque <50% noted in the CCA.  Vertebrals: Bilateral vertebral arteries demonstrate antegrade flow.  Subclavians: Right subclavian artery flow was disturbed. Normal flow hemodynamics were seen in the left subclavian artery.    [facility] 08/02/18 Gwenlyn Found, Roderic Palau, MD):  Ost RCA to Prox RCA lesion is 100% stenosed.  Ost LAD to Prox LAD lesion is 100% stenosed.  Ost Cx lesion is 70% stenosed.  Ost 1st Mrg lesion is 90% stenosed.  Prox Cx lesion is 60% stenosed.  Origin to Prox Graft lesion is 100% stenosed.  Previously placed Origin to Mid Graft stent (unknown type) is widely patent. IMPRESSION: Mr. Gaspard has an occluded RCA vein graft with left-to-right collaterals which is a new finding since his last cath 4 years ago.  Proximal circumflex and obtuse marginal branch have progressed but unfortunately was not able  to cross the obtuse marginal branch ostium.  This came out from the AV groove had a right angle and was high-grade and calcified.  At this point, I recommend aggressive antianginal medical therapy.     Past Medical History:  Diagnosis Date  . Age-related macular degeneration, wet, both eyes ([place])   . Anemia    Secondary to acute blood loss  . Anginal pain ([place])   . Arthritis    "mild in hands, knees, ankles" (11/13/2015)  . Atrial fibrillation ([place])    Consideration was given for atrial flutter ablation, but patient developed atrial fibrillation. Cardioversion was done. Dr. Caryl Comes decided to watch him clinically. November, 2011  . Atrial flutter [facility]) 07/2010   September, 2011   Hospital with PNA and cath done.Marland KitchenMarland KitchenCoumadin.  Atrial flutter ablation planned, but  pt. then had atrial fibrillation,/outpatient  conversion 09/08/10..NSR..plan to follow..Dr. Caryl Comes  . CAD (coronary artery disease)    Catheterization, September, 2011,  grafts patent from redo CABG,, medical therapy of coronary disease, consideration to proceeding with atrial flutter ablation  . Carotid artery disease ([place])    Doppler 09/18/2009 - 49% bilateral stenoses  . Carotid artery disease ([name])    49% bilateral, Doppler, November, 2010  . Chronic kidney disease (CKD), stage III (moderate)   . Diabetic peripheral neuropathy ([place])   . Diverticulosis of colon with hemorrhage 2009   several unit diverticular bleed   . Ejection fraction    EF 60%, echo, 2009  //   EF 65%, echo, September, 2011  . Erectile dysfunction    Mild  . Gout   . History of blood transfusion "several"   related to diverticular bleeding  . Hyperlipidemia   . Hypertension   . Mitral regurgitation    Mild, echo, September, 2011  . NSTEMI (non-ST elevated myocardial infarction) ([place]) 07/2010   at [facility] with repeat cath, rec medical mgmt   . OSA on CPAP   . Personal history of colonic polyps   . PNA (pneumonia) 9/11   NSTEMI at [facility] with repeat cath, rec medical mgmt   . RBBB (right bundle branch block)   . Shoulder pain    "positional; better now" (11/13/2015)  . Skin cancer    R lower leg, per derm 2012  . Type II diabetes mellitus ([place])     Past Surgical History:  Procedure Laterality Date  . CARDIAC CATHETERIZATION  2006   Nuclear..slight lateral ischemia..medical therapy  . CARDIAC CATHETERIZATION  08/04/2010   grafts patent from redo CABG...medical Rx and ablate Atrial flutter (LV not injected)   . CARDIOVERSION  ~ 2010  . CAROTID ENDARTERECTOMY Right 1994  . CATARACT EXTRACTION W/ INTRAOCULAR LENS  IMPLANT, BILATERAL Bilateral   . COLON RESECTION N/A 01/13/2016   Procedure: EXPLORATORY LAPAROTOMY, LEFT AND SIGMOID COLON REMOVAL;  Surgeon: Ralene Ok, MD;  Location: [place];  Service: General;  Laterality: N/A;  Extended open left hemicolectomy  and sigmoidectomy   . COLONOSCOPY N/A 11/14/2015   Procedure: COLONOSCOPY;  Surgeon: Manus Gunning, MD;  Location: [facility] ENDOSCOPY;  Service: Gastroenterology;  Laterality: N/A;  . COLONOSCOPY Left 01/05/2016   Procedure: COLONOSCOPY;  Surgeon: Manus Gunning, MD;  Location: [place];  Service: Gastroenterology;  Laterality: Left;  no sedation to start, moderate if needed  . COLONOSCOPY W/ POLYPECTOMY    . CORONARY ARTERY BYPASS GRAFT  1995; 2006   "X 3; X3"  . [place], 2006  . DOPPLER ECHOCARDIOGRAPHY  08/2008  EF 60%  . DOPPLER ECHOCARDIOGRAPHY  08/02/2010   65-70%  . DOPPLER ECHOCARDIOGRAPHY  07/2010   MR mild  . ESOPHAGOGASTRODUODENOSCOPY N/A 06/19/2014   Procedure: ESOPHAGOGASTRODUODENOSCOPY (EGD);  Surgeon: Jerene Bears, MD;  Location[facility] ENDOSCOPY;  Service: Endoscopy;  Laterality: N/A;  . ESOPHAGOGASTRODUODENOSCOPY N/A 11/14/2015   Procedure: ESOPHAGOGASTRODUODENOSCOPY (EGD);  Surgeon: Manus Gunning, MD;  Location: [place];  Service: Gastroenterology;  Laterality: N/A;  . FLEXIBLE SIGMOIDOSCOPY N/A 11/16/2015   Procedure: FLEXIBLE SIGMOIDOSCOPY;  Surgeon: Manus Gunning, MD;  Location: [place];  Service: Gastroenterology;  Laterality: N/A;  . LAPAROSCOPIC CHOLECYSTECTOMY  2008  . LEFT HEART CATH N/A 06/14/2014   Procedure: LEFT HEART CATH;  Surgeon: Sinclair Grooms, MD;  Location: [facility] CATH LAB;  Service: Cardiovascular;  Laterality: N/A;  . LEFT HEART CATH AND CORONARY ANGIOGRAPHY N/A 08/02/2018   Procedure: LEFT HEART CATH AND CORONARY ANGIOGRAPHY;  Surgeon: [name], MD;  Location: [place] CV LAB;  Service: Cardiovascular;  Laterality: N/A;  . LEFT HEART CATHETERIZATION WITH CORONARY/GRAFT ANGIOGRAM N/A 06/11/2014   Procedure: LEFT HEART CATHETERIZATION WITH Beatrix Fetters;  Surgeon: Sinclair Grooms, MD;  Location: [facility] CATH LAB;  Service: Cardiovascular;  Laterality: N/A;  . PERCUTANEOUS  CORONARY STENT INTERVENTION (PCI-S) N/A 06/13/2014   Procedure: PERCUTANEOUS CORONARY STENT INTERVENTION (PCI-S);  Surgeon: Sinclair Grooms, MD;  Location: [facility] CATH LAB;  Service: Cardiovascular;  Laterality: N/A;  . RIGHT HEART CATH N/A 01/06/2020   Procedure: RIGHT HEART CATH;  Surgeon: Jolaine Artist, MD;  Location: [place] CV LAB;  Service: Cardiovascular;  Laterality: N/A;  . SKIN CANCER EXCISION Left 10/2015   calf  . SKIN CANCER EXCISION Right 2014?   chest  . TEE WITHOUT CARDIOVERSION N/A 01/06/2020   Procedure: TRANSESOPHAGEAL ECHOCARDIOGRAM (TEE);  Surgeon: Jolaine Artist, MD;  Location: [facility] ENDOSCOPY;  Service: Cardiovascular;  Laterality: N/A;  . TONSILLECTOMY    . VASECTOMY      MEDICATIONS: . aspirin 81 MG tablet  . BD INSULIN SYRINGE ULTRAFINE 31G X 5/16" 0.3 ML MISC  . cetirizine (ZYRTEC) 10 MG tablet  . Cholecalciferol (VITAMIN D) 1000 UNITS capsule  . colchicine 0.6 MG tablet  . ELIQUIS 2.5 MG TABS tablet  . fish oil-omega-3 fatty acids 1000 MG capsule  . furosemide (LASIX) 80 MG tablet  . glucose blood (FREESTYLE LITE) test strip  . hydrALAZINE (APRESOLINE) 25 MG tablet  . isosorbide mononitrate (IMDUR) 60 MG 24 hr tablet  . LANTUS 100 UNIT/ML injection  . magnesium oxide (MAG-OX) 400 MG tablet  . metoprolol tartrate (LOPRESSOR) 50 MG tablet  . Multiple Vitamin (MULTIVITAMIN WITH MINERALS) TABS  . nitroGLYCERIN (NITROSTAT) 0.4 MG SL tablet  . Potassium Gluconate (K-99) 595 MG CAPS  . ranolazine (RANEXA) 500 MG 12 hr tablet  . rosuvastatin (CRESTOR) 40 MG tablet   No current facility-administered medications for this encounter.     Myra Gianotti, PA-C Surgical Short Stay/Anesthesiology [facility] Phone [phone] [facility] Phone [phone] 01/21/2020 4:27 PM

## 2020-01-21 NOTE — Anesthesia Preprocedure Evaluation (Addendum)
Anesthesia Evaluation  Patient identified by MRN, date of birth, ID band Patient awake    Reviewed: Allergy & Precautions, NPO status , Patient's Chart, lab work & pertinent test results  Airway Mallampati: II  TM Distance: >3 FB Neck ROM: Full    Dental no notable dental hx.    Pulmonary sleep apnea and Continuous Positive Airway Pressure Ventilation , former smoker,    Pulmonary exam normal breath sounds clear to auscultation       Cardiovascular hypertension, + CAD, + Past MI, + CABG and + DOE  + dysrhythmias Atrial Fibrillation + Valvular Problems/Murmurs MR  Rhythm:Regular Rate:Normal + Systolic murmurs EF 69% Severe MR Biatrial enlargement   Neuro/Psych negative neurological ROS  negative psych ROS   GI/Hepatic negative GI ROS, Neg liver ROS,   Endo/Other  diabetes  Renal/GU negative Renal ROS  negative genitourinary   Musculoskeletal negative musculoskeletal ROS (+)   Abdominal   Peds negative pediatric ROS (+)  Hematology negative hematology ROS (+)   Anesthesia Other Findings   Reproductive/Obstetrics negative OB ROS                            Anesthesia Physical Anesthesia Plan  ASA: IV  Anesthesia Plan: General   Post-op Pain Management:    Induction: Intravenous  PONV Risk Score and Plan: 2 and Ondansetron, Dexamethasone and Treatment may vary due to age or medical condition  Airway Management Planned: Oral ETT  Additional Equipment: Arterial line  Intra-op Plan:   Post-operative Plan: Extubation in OR  Informed Consent: I have reviewed the patients History and Physical, chart, labs and discussed the procedure including the risks, benefits and alternatives for the proposed anesthesia with the patient or authorized representative who has indicated his/her understanding and acceptance.     Dental advisory given  Plan Discussed with: CRNA and  Surgeon  Anesthesia Plan Comments: (PAT note written by Myra Gianotti, PA-C. )     Anesthesia Quick Evaluation

## 2020-01-22 ENCOUNTER — Other Ambulatory Visit: Payer: Self-pay

## 2020-01-22 DIAGNOSIS — I340 Nonrheumatic mitral (valve) insufficiency: Secondary | ICD-10-CM

## 2020-01-31 LAB — TYPE AND SCREEN
ABO/RH(D): O NEG
Antibody Screen: NEGATIVE
Donor AG Type: NEGATIVE
Donor AG Type: NEGATIVE
PT AG Type: NEGATIVE
Unit division: 0
Unit division: 0

## 2020-01-31 LAB — BPAM RBC
Blood Product Expiration Date: 202103242359
Blood Product Expiration Date: 202103272359
ISSUE DATE / TIME: 202103020702
Unit Type and Rh: 9500
Unit Type and Rh: 9500

## 2020-02-03 ENCOUNTER — Other Ambulatory Visit (HOSPITAL_COMMUNITY)
Admission: RE | Admit: 2020-02-03 | Discharge: 2020-02-03 | Disposition: A | Discharge status: Home / Self Care | Payer: Medicare Other | Source: Ambulatory Visit | Attending: Cardiovascular Disease | Admitting: Cardiovascular Disease

## 2020-02-03 ENCOUNTER — Encounter (HOSPITAL_COMMUNITY): Payer: Self-pay

## 2020-02-03 ENCOUNTER — Other Ambulatory Visit: Payer: Self-pay

## 2020-02-03 ENCOUNTER — Encounter (HOSPITAL_COMMUNITY)
Admission: RE | Admit: 2020-02-03 | Discharge: 2020-02-03 | Disposition: A | Discharge status: Home / Self Care | Payer: Medicare Other | Source: Ambulatory Visit | Attending: Cardiovascular Disease | Admitting: Cardiovascular Disease

## 2020-02-03 ENCOUNTER — Ambulatory Visit (HOSPITAL_COMMUNITY)
Admission: RE | Admit: 2020-02-03 | Discharge: 2020-02-03 | Disposition: A | Discharge status: Home / Self Care | Payer: Medicare Other | Source: Ambulatory Visit | Attending: Cardiovascular Disease | Admitting: Cardiovascular Disease

## 2020-02-03 DIAGNOSIS — I2510 Atherosclerotic heart disease of native coronary artery without angina pectoris: Secondary | ICD-10-CM | POA: Insufficient documentation

## 2020-02-03 DIAGNOSIS — Z20822 Contact with and (suspected) exposure to covid-19: Secondary | ICD-10-CM | POA: Insufficient documentation

## 2020-02-03 DIAGNOSIS — Z951 Presence of aortocoronary bypass graft: Secondary | ICD-10-CM | POA: Insufficient documentation

## 2020-02-03 DIAGNOSIS — R0989 Other specified symptoms and signs involving the circulatory and respiratory systems: Secondary | ICD-10-CM | POA: Insufficient documentation

## 2020-02-03 DIAGNOSIS — G4733 Obstructive sleep apnea (adult) (pediatric): Secondary | ICD-10-CM | POA: Insufficient documentation

## 2020-02-03 DIAGNOSIS — Z01818 Encounter for other preprocedural examination: Secondary | ICD-10-CM | POA: Insufficient documentation

## 2020-02-03 DIAGNOSIS — I340 Nonrheumatic mitral (valve) insufficiency: Secondary | ICD-10-CM | POA: Insufficient documentation

## 2020-02-03 DIAGNOSIS — R011 Cardiac murmur, unspecified: Secondary | ICD-10-CM | POA: Diagnosis not present

## 2020-02-03 DIAGNOSIS — Z01812 Encounter for preprocedural laboratory examination: Secondary | ICD-10-CM | POA: Insufficient documentation

## 2020-02-03 DIAGNOSIS — I4891 Unspecified atrial fibrillation: Secondary | ICD-10-CM | POA: Insufficient documentation

## 2020-02-03 DIAGNOSIS — E785 Hyperlipidemia, unspecified: Secondary | ICD-10-CM | POA: Insufficient documentation

## 2020-02-03 DIAGNOSIS — Z955 Presence of coronary angioplasty implant and graft: Secondary | ICD-10-CM | POA: Insufficient documentation

## 2020-02-03 DIAGNOSIS — Z87891 Personal history of nicotine dependence: Secondary | ICD-10-CM | POA: Insufficient documentation

## 2020-02-03 DIAGNOSIS — I252 Old myocardial infarction: Secondary | ICD-10-CM | POA: Insufficient documentation

## 2020-02-03 DIAGNOSIS — Z7901 Long term (current) use of anticoagulants: Secondary | ICD-10-CM | POA: Insufficient documentation

## 2020-02-03 DIAGNOSIS — I130 Hypertensive heart and chronic kidney disease with heart failure and stage 1 through stage 4 chronic kidney disease, or unspecified chronic kidney disease: Secondary | ICD-10-CM | POA: Insufficient documentation

## 2020-02-03 DIAGNOSIS — D500 Iron deficiency anemia secondary to blood loss (chronic): Secondary | ICD-10-CM | POA: Diagnosis not present

## 2020-02-03 DIAGNOSIS — N184 Chronic kidney disease, stage 4 (severe): Secondary | ICD-10-CM | POA: Insufficient documentation

## 2020-02-03 DIAGNOSIS — M109 Gout, unspecified: Secondary | ICD-10-CM | POA: Insufficient documentation

## 2020-02-03 DIAGNOSIS — Z794 Long term (current) use of insulin: Secondary | ICD-10-CM | POA: Insufficient documentation

## 2020-02-03 DIAGNOSIS — Z79899 Other long term (current) drug therapy: Secondary | ICD-10-CM | POA: Insufficient documentation

## 2020-02-03 DIAGNOSIS — I5042 Chronic combined systolic (congestive) and diastolic (congestive) heart failure: Secondary | ICD-10-CM | POA: Insufficient documentation

## 2020-02-03 DIAGNOSIS — Z7982 Long term (current) use of aspirin: Secondary | ICD-10-CM | POA: Insufficient documentation

## 2020-02-03 DIAGNOSIS — H9190 Unspecified hearing loss, unspecified ear: Secondary | ICD-10-CM | POA: Insufficient documentation

## 2020-02-03 DIAGNOSIS — H353230 Exudative age-related macular degeneration, bilateral, stage unspecified: Secondary | ICD-10-CM | POA: Insufficient documentation

## 2020-02-03 DIAGNOSIS — Z85828 Personal history of other malignant neoplasm of skin: Secondary | ICD-10-CM | POA: Insufficient documentation

## 2020-02-03 DIAGNOSIS — E1122 Type 2 diabetes mellitus with diabetic chronic kidney disease: Secondary | ICD-10-CM | POA: Insufficient documentation

## 2020-02-03 DIAGNOSIS — I5032 Chronic diastolic (congestive) heart failure: Secondary | ICD-10-CM | POA: Insufficient documentation

## 2020-02-03 DIAGNOSIS — E1142 Type 2 diabetes mellitus with diabetic polyneuropathy: Secondary | ICD-10-CM | POA: Insufficient documentation

## 2020-02-03 DIAGNOSIS — I4510 Unspecified right bundle-branch block: Secondary | ICD-10-CM | POA: Insufficient documentation

## 2020-02-03 HISTORY — DX: Unspecified hearing loss, unspecified ear: H91.90

## 2020-02-03 LAB — URINALYSIS, ROUTINE W REFLEX MICROSCOPIC
Bilirubin Urine: NEGATIVE
Glucose, UA: NEGATIVE mg/dL
Hgb urine dipstick: NEGATIVE
Ketones, ur: NEGATIVE mg/dL
Leukocytes,Ua: NEGATIVE
Nitrite: NEGATIVE
Protein, ur: 30 mg/dL — AB
Specific Gravity, Urine: 1.012 (ref 1.005–1.030)
pH: 6 (ref 5.0–8.0)

## 2020-02-03 LAB — BLOOD GAS, ARTERIAL
Acid-Base Excess: 3.8 mmol/L — ABNORMAL HIGH (ref 0.0–2.0)
Bicarbonate: 28.1 mmol/L — ABNORMAL HIGH (ref 20.0–28.0)
Drawn by: 421801
FIO2: 21
O2 Saturation: 97.2 %
Patient temperature: 37
pCO2 arterial: 45 mmHg (ref 32.0–48.0)
pH, Arterial: 7.412 (ref 7.350–7.450)
pO2, Arterial: 94.8 mmHg (ref 83.0–108.0)

## 2020-02-03 LAB — COMPREHENSIVE METABOLIC PANEL
ALT: 22 U/L (ref 0–44)
AST: 21 U/L (ref 15–41)
Albumin: 3.5 g/dL (ref 3.5–5.0)
Alkaline Phosphatase: 66 U/L (ref 38–126)
Anion gap: 10 (ref 5–15)
BUN: 40 mg/dL — ABNORMAL HIGH (ref 8–23)
CO2: 25 mmol/L (ref 22–32)
Calcium: 9.8 mg/dL (ref 8.9–10.3)
Chloride: 100 mmol/L (ref 98–111)
Creatinine, Ser: 2.48 mg/dL — ABNORMAL HIGH (ref 0.61–1.24)
GFR calc Af Amer: 26 mL/min — ABNORMAL LOW (ref >60)
GFR calc non Af Amer: 23 mL/min — ABNORMAL LOW (ref >60)
Glucose, Bld: 380 mg/dL — ABNORMAL HIGH (ref 70–99)
Potassium: 4.3 mmol/L (ref 3.5–5.1)
Sodium: 135 mmol/L (ref 135–145)
Total Bilirubin: 1.2 mg/dL (ref 0.3–1.2)
Total Protein: 7 g/dL (ref 6.5–8.1)

## 2020-02-03 LAB — CBC
HCT: 37.9 % — ABNORMAL LOW (ref 39.0–52.0)
Hemoglobin: 11.7 g/dL — ABNORMAL LOW (ref 13.0–17.0)
MCH: 31 pg (ref 26.0–34.0)
MCHC: 30.9 g/dL (ref 30.0–36.0)
MCV: 100.3 fL — ABNORMAL HIGH (ref 80.0–100.0)
Platelets: 125 10*3/uL — ABNORMAL LOW (ref 150–400)
RBC: 3.78 MIL/uL — ABNORMAL LOW (ref 4.22–5.81)
RDW: 15.6 % — ABNORMAL HIGH (ref 11.5–15.5)
WBC: 6.5 10*3/uL (ref 4.0–10.5)
nRBC: 0 % (ref 0.0–0.2)

## 2020-02-03 LAB — SARS CORONAVIRUS 2 (TAT 6-24 HRS): SARS Coronavirus 2: NEGATIVE

## 2020-02-03 LAB — BRAIN NATRIURETIC PEPTIDE: B Natriuretic Peptide: 437.4 pg/mL — ABNORMAL HIGH (ref 0.0–100.0)

## 2020-02-03 LAB — GLUCOSE, CAPILLARY: Glucose-Capillary: 321 mg/dL — ABNORMAL HIGH (ref 70–99)

## 2020-02-03 NOTE — Progress Notes (Signed)
Patient denies shortness of breath, fever, cough and chest pain.  PCP - Dr [name] Cardiologist - Dr Quay Burow Nephrologist - Dr Elmarie Shiley [facility])  Chest x-ray - 02/03/20 EKG - 01/20/20 Stress Test - 07/12/18 ECHO TEE- 01/06/20 Cardiac Cath - 01/06/20  Sleep Study - Yes CPAP - Uses CPAP nightly  Fasting Blood Sugar - 140-180s Checks Blood Sugar_1_times a day HgbA1C on 01/20/20 was 8.9.  Blood Thinner Instructions:  Eliquis last dose on 02/03/20 per patient.  Aspirin Instructions: Continue up until day of surgery.  Anesthesia review: Yes  Coronavirus Screening Covid test on 02/03/20 at 1040 am. Have you experienced the following symptoms:  Cough yes/no: No Fever (>100.70F)  yes/no: No Runny nose yes/no: No Sore throat yes/no: No Difficulty breathing/shortness of breath  yes/no: No  Have you traveled in the last 14 days and where? yes/no: No  Patient verbalized understanding of instructions that were given to them at the PAT appointment.

## 2020-02-03 NOTE — Progress Notes (Signed)
[place], [place] [address] [place] [address] Phone: [phone] Fax: [phone]  [id number] [place], [place] [address] Phone: [phone] Fax: [phone]    Your procedure is scheduled on Thursday, March 25th  Report to [facility] Main Entrance "A" at 5:30 A.M., and check in at the Admitting office.  Call this number if you have problems the morning of surgery:  [phone]  Call [phone] if you have any questions prior to your surgery date Monday-Friday 8am-4pm   Remember:  Do not eat or drink after midnight the night before your surgery    Take these medicines the morning of surgery with A SIP OF WATER hydrALAZINE (APRESOLINE) isosorbide mononitrate (IMDUR)  metoprolol tartrate (LOPRESSOR) ranolazine (RANEXA)  If needed - cetirizine (ZYRTEC), nitroGLYCERIN (NITROSTAT),    Per surgeon's instructions:   1. Stop Eliquis on 02/04/2020.   2. Continue taking all other current medications including Aspirin without change through the day before surgery.   3. Please take half of your evening dose of Lantus (10 - 12 units) on Wednesday, 02/05/2020   As of today, STOP taking Aleve, Naproxen, Ibuprofen, Motrin, Advil, Goody's, BC's, all herbal medications, fish oil, and all vitamins.  HOW TO MANAGE YOUR DIABETES BEFORE AND AFTER SURGERY  Why is it important to control my blood sugar before and after surgery? . Improving blood sugar levels before and after surgery helps healing and can limit problems. . A way of improving blood sugar control is eating a healthy diet by: o  Eating less sugar and carbohydrates o  Increasing activity/exercise o  Talking with your doctor about reaching your blood sugar goals . High blood sugars (greater than 180 mg/dL) can raise your risk of infections and slow your recovery, so you will need to focus on controlling  your diabetes during the weeks before surgery. . Make sure that the doctor who takes care of your diabetes knows about your planned surgery including the date and location.  How do I manage my blood sugar before surgery? . Check your blood sugar at least 4 times a day, starting 2 days before surgery, to make sure that the level is not too high or low. . Check your blood sugar the morning of your surgery when you wake up and every 2 hours until you get to the Short Stay unit. o If your blood sugar is less than 70 mg/dL, you will need to treat for low blood sugar: - Do not take insulin. - Treat a low blood sugar (less than 70 mg/dL) with  cup of clear juice (cranberry or apple), 4 glucose tablets, OR glucose gel. - Recheck blood sugar in 15 minutes after treatment (to make sure it is greater than 70 mg/dL). If your blood sugar is not greater than 70 mg/dL on recheck, call [phone] for further instructions. . Report your blood sugar to the short stay nurse when you get to Short Stay.  . If you are admitted to the hospital after surgery: o Your blood sugar will be checked by the staff and you will probably be given insulin after surgery (instead of oral diabetes medicines) to make sure you have good blood sugar levels. o The goal for blood sugar control after surgery is 80-180 mg/dL.  The Morning of Surgery  Do not wear jewelry.  Do not wear lotions, powders, colognes, or deodorant  Men may shave  face and neck.  Do not bring valuables to the hospital.  [facility] is not responsible for any belongings or valuables.  If you are a smoker, DO NOT Smoke 24 hours prior to surgery  If you wear a CPAP at night please bring your mask the morning of surgery   Remember that you must have someone to transport you home after your surgery, and remain with you for 24 hours if you are discharged the same day.  Please bring cases for contacts, glasses, hearing aids, dentures or bridgework because it  cannot be worn into surgery.   Leave your suitcase in the car.  After surgery it may be brought to your room.  For patients admitted to the hospital, discharge time will be determined by your treatment team.  Patients discharged the day of surgery will not be allowed to drive home.   Special instructions:   [place]- Preparing For Surgery  Before surgery, you can play an important role. Because skin is not sterile, your skin needs to be as free of germs as possible. You can reduce the number of germs on your skin by washing with CHG (chlorahexidine gluconate) Soap before surgery.  CHG is an antiseptic cleaner which kills germs and bonds with the skin to continue killing germs even after washing.    Oral Hygiene is also important to reduce your risk of infection.  Remember - BRUSH YOUR TEETH THE MORNING OF SURGERY WITH YOUR REGULAR TOOTHPASTE  Please do not use if you have an allergy to CHG or antibacterial soaps. If your skin becomes reddened/irritated stop using the CHG.  Do not shave (including legs and underarms) for at least 48 hours prior to first CHG shower. It is OK to shave your face.  Please follow these instructions carefully.   1. Shower the NIGHT BEFORE SURGERY Wed and the MORNING OF SURGERY Thurs with CHG Soap.   2. If you chose to wash your hair, wash your hair first as usual with your normal shampoo.  3. After you shampoo, rinse your hair and body thoroughly to remove the shampoo.  4. Use CHG as you would any other liquid soap. You can apply CHG directly to the skin and wash gently with a scrungie or a clean washcloth.   5. Apply the CHG Soap to your body ONLY FROM THE NECK DOWN.  Do not use on open wounds or open sores. Avoid contact with your eyes, ears, mouth and genitals (private parts). Wash Face and genitals (private parts)  with your normal soap.   6. Wash thoroughly, paying special attention to the area where your surgery will be performed.  7. Thoroughly  rinse your body with warm water from the neck down.  8. DO NOT shower/wash with your normal soap after using and rinsing off the CHG Soap.  9. Pat yourself dry with a CLEAN TOWEL.  10. Wear CLEAN PAJAMAS to bed the night before surgery, wear comfortable clothes the morning of surgery  11. Place CLEAN SHEETS on your bed the night of your first shower and DO NOT SLEEP WITH PETS.  Day of Surgery: Please shower the morning of surgery with the CHG soap Do not apply any deodorants/lotions. Please wear clean clothes to the hospital/surgery center.   Remember to brush your teeth WITH YOUR REGULAR TOOTHPASTE.  Please read over the following fact sheets that you were given.

## 2020-02-04 ENCOUNTER — Other Ambulatory Visit: Payer: Self-pay | Admitting: Physician Assistant

## 2020-02-04 DIAGNOSIS — Z9889 Other specified postprocedural states: Secondary | ICD-10-CM

## 2020-02-04 NOTE — Progress Notes (Signed)
Anesthesia Chart Review:  Case: [id number] Date/Time: 02/06/20 0730   Procedure: MITRAL VALVE REPAIR (N/A )   Anesthesia type: General   Pre-op diagnosis: Severe Mitral Insufficiency   Location: [facility] CATH LAB 6 / [place] INVASIVE CV LAB   Providers: Sherren Mocha, MD      DISCUSSION: Patient is an 86 year old male scheduled for the above procedure.  Procedure was initially scheduled for 01/23/2020 but was rescheduled for 02/06/20.   History includes former smoker, CAD (CABG 1996: SVG-PD-PL and SVG-CX; redo CABG: LIMA-LAD, SVG-DIAG, SVG-PDA 2006; MI 08/04/10 with occluded 1996 grafts, patent 2006 grafts; NSTEMI, s/p DES SVG-OM 06/13/14 and 06/14/14 for early stent failure/Plavix non-responder; occluded SVG-RCA with left-to-right collaterals and high grade OM and AV groove lesion with unsuccessful PCI attempt 08/03/19), combined systolic and diastolic CHF, afib/flutter, right BBB, severe MR, HTN, HLD, DM2 (with neuropathy), CKD (stage IV), carotid artery stenosis (right CEA 1994), macular degeneration, skin cancer, GI bleed (recurrent diverticular bleed; s/p left hemicolectomy and sigmoidectomy 01/13/16, perioperative MI 01/16/16), OSA (CPAP).  He is not felt to be a candidate for conventional surgical MVR with or without attempt at a second redo CABG per preoperative CT surgery evaluation by Darylene Price, MD on 01/10/20.   A1c 8.9% on 01/20/20. Non-fasting glucose at PAT 380 which is consistent with his 01/20/20 PAT labs previously reviewed by Dr. Burt Knack. He reported home CBG ~140-180's. Creatinine 2.48, but known CKD stage IV with Cr ~ 2.16-2.41 since 06/2019.   Last Eliquis 02/03/20. He is for PT/PTT and fasting CBG on the day of surgery. Presurgical COVID-19 test negative on 02/03/20.  Anesthesia team to evaluate on the day of surgery.   VS: BP 137/62   Pulse 68   Temp 37 C (Oral)   Resp 18   Ht 5\' 7"  (1.702 m)   Wt 87.1 kg   SpO2 97%   BMI 30.09 kg/m   PROVIDERS: Tonia Ghent, MD is PCP Quay Burow, MD is primary cardiologist Glori Bickers, MD is HF cardiologist Elmarie Shiley, MD is nephrologist [facility])   LABS: Preoperative labs noted and have been are marked as preliminarily reviewed by Leodis Liverpool, RN and forwarded to Dr. Burt Knack. Creatinine 2.48, but overall consistent with prior results. A1c 8.9% on 01/20/20, up from 8.4% 06/21/19. (all labs ordered are listed, but only abnormal results are displayed)  Labs Reviewed  GLUCOSE, CAPILLARY - Abnormal; Notable for the following components:      Result Value   Glucose-Capillary 321 (*)    All other components within normal limits  BLOOD GAS, ARTERIAL - Abnormal; Notable for the following components:   Bicarbonate 28.1 (*)    Acid-Base Excess 3.8 (*)    Allens test (pass/fail) BRACHIAL ARTERY (*)    All other components within normal limits  BRAIN NATRIURETIC PEPTIDE - Abnormal; Notable for the following components:   B Natriuretic Peptide 437.4 (*)    All other components within normal limits  CBC - Abnormal; Notable for the following components:   RBC 3.78 (*)    Hemoglobin 11.7 (*)    HCT 37.9 (*)    MCV 100.3 (*)    RDW 15.6 (*)    Platelets 125 (*)    All other components within normal limits  COMPREHENSIVE METABOLIC PANEL - Abnormal; Notable for the following components:   Glucose, Bld 380 (*)    BUN 40 (*)    Creatinine, Ser 2.48 (*)    GFR calc non Af Amer 23 (*)  GFR calc Af Amer 26 (*)    All other components within normal limits  URINALYSIS, ROUTINE W REFLEX MICROSCOPIC - Abnormal; Notable for the following components:   Protein, ur 30 (*)    Bacteria, UA RARE (*)    All other components within normal limits  TYPE AND SCREEN     IMAGES: CXR 01/20/20: FINDINGS: Enlargement of cardiac silhouette post CABG and coronary stenting. Slight pulmonary vascular congestion. Mediastinal contours normal with atherosclerotic calcifications aorta. Chronic bronchitic changes without  pulmonary infiltrate, pleural effusion or pneumothorax. Bones demineralized with scattered degenerative disc disease changes of the thoracic spine and LEFT glenohumeral degenerative changes. IMPRESSION: Enlargement of cardiac silhouette post CABG and coronary stenting with slight pulmonary vascular congestion. Chronic bronchitic changes.   EKG: 01/20/20:  Ventricular rate 67 bpm Atrial fibrillation with a competing junctional pacemaker Right bundle branch block Left anterior fascicular block ** Bifascicular block ** Abnormal ECG No significant change since last tracing Confirmed by Skeet Latch [phone]) on 01/21/2020 8:37:29 AM   CV: TEE 01/06/20: IMPRESSIONS   1. Left ventricular ejection fraction, by estimation, is 50 to 55%. The  left ventricle has low normal function. The left ventricle demonstrates  regional wall motion abnormalities (see scoring diagram/findings for  description). There is hypokinesis of  the inferior wall.  2. Right ventricular systolic function is mildly reduced. The right  ventricular size is normal.  3. Left atrial size was severely dilated. No left atrial/left atrial  appendage thrombus was detected.  4. Right atrial size was severely dilated.  5. The aortic valve is tricuspid. Aortic valve regurgitation is trivial.  6. The mitral valve is abnormal. There is mild leaflet calcification.  With minimal prolapse of the anterior leaflet and mild restriction of the  posterior (P2) leaflet. There is severe central 3-4+ MR with 2 jets. Very  mild diastolic flow reversal in the  pulmonary veins. . The mitral valve is abnormal. Severe mitral valve  regurgitation. No evidence of mitral stenosis.  7. There is Severe (Grade IV) plaque.    [place] 01/06/20 (Bensimhon, Daniel, MD): Findings: RA = 10 RV = 58/8 PA = 58/19 (33) PCW = 20 (v waves to 35)  Fick cardiac output/index = 4.3/2.1 Thermo CO/CI = 3.2/1.7 PVR = 3.1 (Fick) 4.0 (thermo) Ao sat  = 99% PA sat = 64%, 68% Assessment: 1. Moderately elevated biventricular pressures with low cardiac output  2. Prominent v-waves on PCWP tracing c/w known severe MR Plan/Discussion: Will review possible Mitraclip with structural heart team.   Carotid US 12/24/19: Summary:  Right Carotid: Velocities in the right ICA are consistent with a 1-39% stenosis.  Non-hemodynamically significant plaque <50% noted in the CCA.  Left Carotid: Velocities in the left ICA are consistent with a 1-39%stenosis. Non-hemodynamically significant plaque <50% noted in the CCA.  Vertebrals: Bilateral vertebral arteries demonstrate antegrade flow.  Subclavians: Right subclavian artery flow was disturbed. Normal flow hemodynamics were seen in the left subclavian artery.    [facility] 08/02/18 Gwenlyn Found, Roderic Palau, MD):  Ost RCA to Prox RCA lesion is 100% stenosed.  Ost LAD to Prox LAD lesion is 100% stenosed.  Ost Cx lesion is 70% stenosed.  Ost 1st Mrg lesion is 90% stenosed.  Prox Cx lesion is 60% stenosed.  Origin to Prox Graft lesion is 100% stenosed.  Previously placed Origin to Mid Graft stent (unknown type) is widely patent. IMPRESSION:Mr. Harriman has an occluded RCA vein graft with left-to-right collaterals which is a new finding since his last cath 4  years ago. Proximal circumflex and obtuse marginal branch have progressed but unfortunately was not able to cross the obtuse marginal branch ostium. This came out from the AV groove had a right angle and was high-grade and calcified. At this point, I recommend aggressive antianginal medical therapy.    Past Medical History:  Diagnosis Date  . Age-related macular degeneration, wet, both eyes ([place])   . Anemia    Secondary to acute blood loss  . Anginal pain ([place])    last chest pain in Feb 2021  . Arthritis    "mild in hands, knees, ankles" (11/13/2015)  . Atrial fibrillation ([place])    Consideration was given for atrial flutter ablation, but  patient developed atrial fibrillation. Cardioversion was done. Dr. Caryl Comes decided to watch him clinically. November, 2011  . Atrial flutter [facility]) 07/2010   September, 2011   Hospital with PNA and cath done.Marland KitchenMarland KitchenCoumadin.  Atrial flutter ablation planned, but  pt. then had atrial fibrillation,/outpatient conversion 09/08/10..NSR..plan to follow..Dr. Caryl Comes  . CAD (coronary artery disease)    Catheterization, September, 2011,  grafts patent from redo CABG,, medical therapy of coronary disease, consideration to proceeding with atrial flutter ablation  . Carotid artery disease ([place])    Doppler 09/18/2009 - 49% bilateral stenoses  . Carotid artery disease ([name])    49% bilateral, Doppler, November, 2010  . CHF (congestive heart failure) ([place])   . Chronic kidney disease (CKD), stage III (moderate)   . Diabetic peripheral neuropathy ([name])    feet  . Diverticulosis of colon with hemorrhage 2009   several unit diverticular bleed   . Ejection fraction    EF 60%, echo, 2009  //   EF 65%, echo, September, 2011  . Erectile dysfunction    Mild  . Gout   . Hearing loss    wears hearing aids  . Heart murmur   . History of blood transfusion "several"   related to diverticular bleeding  . Hyperlipidemia   . Hypertension   . Mitral regurgitation    Mild, echo, September, 2011  . NSTEMI (non-ST elevated myocardial infarction) ([place]) 07/2010   at [facility] with repeat cath, rec medical mgmt   . OSA on CPAP    uses CPAP nightly  . Personal history of colonic polyps   . PNA (pneumonia) 9/11   NSTEMI at [facility] with repeat cath, rec medical mgmt   . RBBB (right bundle branch block)   . Shoulder pain    "positional; better now" (11/13/2015)  . Skin cancer    R lower leg, per derm 2012  . Type II diabetes mellitus ([place])     Past Surgical History:  Procedure Laterality Date  . CARDIAC CATHETERIZATION  2006   Nuclear..slight lateral ischemia..medical therapy  . CARDIAC CATHETERIZATION  08/04/2010   grafts patent  from redo CABG...medical Rx and ablate Atrial flutter (LV not injected)   . CARDIOVERSION  ~ 2010  . CAROTID ENDARTERECTOMY Right 1994  . CATARACT EXTRACTION W/ INTRAOCULAR LENS  IMPLANT, BILATERAL Bilateral   . COLON RESECTION N/A 01/13/2016   Procedure: EXPLORATORY LAPAROTOMY, LEFT AND SIGMOID COLON REMOVAL;  Surgeon: Ralene Ok, MD;  Location: [place];  Service: General;  Laterality: N/A;  Extended open left hemicolectomy and sigmoidectomy   . COLONOSCOPY N/A 11/14/2015   Procedure: COLONOSCOPY;  Surgeon: Manus Gunning, MD;  Location: [facility] ENDOSCOPY;  Service: Gastroenterology;  Laterality: N/A;  . COLONOSCOPY Left 01/05/2016   Procedure: COLONOSCOPY;  Surgeon: Manus Gunning, MD;  Location: [facility] ENDOSCOPY;  Service: Gastroenterology;  Laterality: Left;  no sedation to start, moderate if needed  . COLONOSCOPY W/ POLYPECTOMY    . CORONARY ARTERY BYPASS GRAFT  1995; 2006   "X 3; X3"  . [place], 2006  . DOPPLER ECHOCARDIOGRAPHY  08/2008   EF 60%  . DOPPLER ECHOCARDIOGRAPHY  08/02/2010   65-70%  . DOPPLER ECHOCARDIOGRAPHY  07/2010   MR mild  . ESOPHAGOGASTRODUODENOSCOPY N/A 06/19/2014   Procedure: ESOPHAGOGASTRODUODENOSCOPY (EGD);  Surgeon: Jerene Bears, MD;  Location[facility] ENDOSCOPY;  Service: Endoscopy;  Laterality: N/A;  . ESOPHAGOGASTRODUODENOSCOPY N/A 11/14/2015   Procedure: ESOPHAGOGASTRODUODENOSCOPY (EGD);  Surgeon: Manus Gunning, MD;  Location: [place];  Service: Gastroenterology;  Laterality: N/A;  . FLEXIBLE SIGMOIDOSCOPY N/A 11/16/2015   Procedure: FLEXIBLE SIGMOIDOSCOPY;  Surgeon: Manus Gunning, MD;  Location: [place];  Service: Gastroenterology;  Laterality: N/A;  . LAPAROSCOPIC CHOLECYSTECTOMY  2008  . LEFT HEART CATH N/A 06/14/2014   Procedure: LEFT HEART CATH;  Surgeon: Sinclair Grooms, MD;  Location: [facility] CATH LAB;  Service: Cardiovascular;  Laterality: N/A;  . LEFT HEART CATH AND CORONARY ANGIOGRAPHY N/A 08/02/2018    Procedure: LEFT HEART CATH AND CORONARY ANGIOGRAPHY;  Surgeon: [name], MD;  Location: [place] CV LAB;  Service: Cardiovascular;  Laterality: N/A;  . LEFT HEART CATHETERIZATION WITH CORONARY/GRAFT ANGIOGRAM N/A 06/11/2014   Procedure: LEFT HEART CATHETERIZATION WITH Beatrix Fetters;  Surgeon: Sinclair Grooms, MD;  Location: [facility] CATH LAB;  Service: Cardiovascular;  Laterality: N/A;  . PERCUTANEOUS CORONARY STENT INTERVENTION (PCI-S) N/A 06/13/2014   Procedure: PERCUTANEOUS CORONARY STENT INTERVENTION (PCI-S);  Surgeon: Sinclair Grooms, MD;  Location: [facility] CATH LAB;  Service: Cardiovascular;  Laterality: N/A;  . RIGHT HEART CATH N/A 01/06/2020   Procedure: RIGHT HEART CATH;  Surgeon: Jolaine Artist, MD;  Location: [place] CV LAB;  Service: Cardiovascular;  Laterality: N/A;  . SKIN CANCER EXCISION Left 10/2015   calf  . SKIN CANCER EXCISION Right 2014?   chest  . TEE WITHOUT CARDIOVERSION N/A 01/06/2020   Procedure: TRANSESOPHAGEAL ECHOCARDIOGRAM (TEE);  Surgeon: Jolaine Artist, MD;  Location: [facility] ENDOSCOPY;  Service: Cardiovascular;  Laterality: N/A;  . TONSILLECTOMY    . VASECTOMY      MEDICATIONS: . aspirin 81 MG tablet  . BD INSULIN SYRINGE ULTRAFINE 31G X 5/16" 0.3 ML MISC  . cetirizine (ZYRTEC) 10 MG tablet  . Cholecalciferol (VITAMIN D) 1000 UNITS capsule  . colchicine 0.6 MG tablet  . ELIQUIS 2.5 MG TABS tablet  . fish oil-omega-3 fatty acids 1000 MG capsule  . furosemide (LASIX) 80 MG tablet  . glucose blood (FREESTYLE LITE) test strip  . hydrALAZINE (APRESOLINE) 25 MG tablet  . isosorbide mononitrate (IMDUR) 60 MG 24 hr tablet  . LANTUS 100 UNIT/ML injection  . magnesium oxide (MAG-OX) 400 MG tablet  . metoprolol tartrate (LOPRESSOR) 50 MG tablet  . Multiple Vitamin (MULTIVITAMIN WITH MINERALS) TABS  . nitroGLYCERIN (NITROSTAT) 0.4 MG SL tablet  . Potassium Gluconate (K-99) 595 MG CAPS  . ranolazine (RANEXA) 500 MG 12 hr tablet  .  rosuvastatin (CRESTOR) 40 MG tablet   No current facility-administered medications for this encounter.    Myra Gianotti, PA-C Surgical Short Stay/Anesthesiology [facility] Phone [phone] [facility] Phone [phone] 02/04/2020 11:08 AM

## 2020-02-05 ENCOUNTER — Encounter (HOSPITAL_COMMUNITY): Payer: Medicare Other | Admitting: Internal Medicine

## 2020-02-06 ENCOUNTER — Other Ambulatory Visit: Payer: Self-pay

## 2020-02-06 ENCOUNTER — Inpatient Hospital Stay (HOSPITAL_COMMUNITY)
Admission: RE | Admit: 2020-02-06 | Discharge: 2020-02-06 | Disposition: A | Discharge status: Home / Self Care | Payer: Medicare Other | Source: Ambulatory Visit | Attending: Cardiovascular Disease | Admitting: Cardiovascular Disease

## 2020-02-06 ENCOUNTER — Encounter (HOSPITAL_COMMUNITY)
Admission: RE | Disposition: A | Discharge status: Home / Self Care | Payer: Self-pay | Source: Home / Self Care | Attending: Cardiovascular Disease

## 2020-02-06 ENCOUNTER — Encounter (HOSPITAL_COMMUNITY): Payer: Self-pay | Admitting: Cardiovascular Disease

## 2020-02-06 ENCOUNTER — Inpatient Hospital Stay (HOSPITAL_COMMUNITY): Payer: Medicare Other | Admitting: Vascular Surgery

## 2020-02-06 ENCOUNTER — Observation Stay (HOSPITAL_COMMUNITY)
Admission: RE | Admit: 2020-02-06 | Discharge: 2020-02-07 | Disposition: A | Discharge status: Home / Self Care | Payer: Medicare Other | Attending: Cardiovascular Disease | Admitting: Cardiovascular Disease

## 2020-02-06 DIAGNOSIS — M19042 Primary osteoarthritis, left hand: Secondary | ICD-10-CM | POA: Diagnosis not present

## 2020-02-06 DIAGNOSIS — M19072 Primary osteoarthritis, left ankle and foot: Secondary | ICD-10-CM | POA: Insufficient documentation

## 2020-02-06 DIAGNOSIS — Z833 Family history of diabetes mellitus: Secondary | ICD-10-CM | POA: Insufficient documentation

## 2020-02-06 DIAGNOSIS — E785 Hyperlipidemia, unspecified: Secondary | ICD-10-CM | POA: Insufficient documentation

## 2020-02-06 DIAGNOSIS — M170 Bilateral primary osteoarthritis of knee: Secondary | ICD-10-CM | POA: Diagnosis not present

## 2020-02-06 DIAGNOSIS — G4730 Sleep apnea, unspecified: Secondary | ICD-10-CM | POA: Diagnosis present

## 2020-02-06 DIAGNOSIS — E1142 Type 2 diabetes mellitus with diabetic polyneuropathy: Secondary | ICD-10-CM | POA: Diagnosis not present

## 2020-02-06 DIAGNOSIS — I080 Rheumatic disorders of both mitral and aortic valves: Secondary | ICD-10-CM | POA: Diagnosis not present

## 2020-02-06 DIAGNOSIS — Z7901 Long term (current) use of anticoagulants: Secondary | ICD-10-CM | POA: Insufficient documentation

## 2020-02-06 DIAGNOSIS — I4510 Unspecified right bundle-branch block: Secondary | ICD-10-CM | POA: Diagnosis present

## 2020-02-06 DIAGNOSIS — I130 Hypertensive heart and chronic kidney disease with heart failure and stage 1 through stage 4 chronic kidney disease, or unspecified chronic kidney disease: Secondary | ICD-10-CM | POA: Diagnosis not present

## 2020-02-06 DIAGNOSIS — Z794 Long term (current) use of insulin: Secondary | ICD-10-CM | POA: Insufficient documentation

## 2020-02-06 DIAGNOSIS — I4821 Permanent atrial fibrillation: Secondary | ICD-10-CM | POA: Insufficient documentation

## 2020-02-06 DIAGNOSIS — I119 Hypertensive heart disease without heart failure: Secondary | ICD-10-CM | POA: Diagnosis present

## 2020-02-06 DIAGNOSIS — G4733 Obstructive sleep apnea (adult) (pediatric): Secondary | ICD-10-CM | POA: Diagnosis not present

## 2020-02-06 DIAGNOSIS — I252 Old myocardial infarction: Secondary | ICD-10-CM | POA: Insufficient documentation

## 2020-02-06 DIAGNOSIS — Z8249 Family history of ischemic heart disease and other diseases of the circulatory system: Secondary | ICD-10-CM | POA: Insufficient documentation

## 2020-02-06 DIAGNOSIS — I480 Paroxysmal atrial fibrillation: Secondary | ICD-10-CM | POA: Diagnosis present

## 2020-02-06 DIAGNOSIS — Z87891 Personal history of nicotine dependence: Secondary | ICD-10-CM | POA: Insufficient documentation

## 2020-02-06 DIAGNOSIS — I779 Disorder of arteries and arterioles, unspecified: Secondary | ICD-10-CM | POA: Diagnosis present

## 2020-02-06 DIAGNOSIS — M19071 Primary osteoarthritis, right ankle and foot: Secondary | ICD-10-CM | POA: Insufficient documentation

## 2020-02-06 DIAGNOSIS — I340 Nonrheumatic mitral (valve) insufficiency: Secondary | ICD-10-CM | POA: Diagnosis present

## 2020-02-06 DIAGNOSIS — I259 Chronic ischemic heart disease, unspecified: Secondary | ICD-10-CM | POA: Insufficient documentation

## 2020-02-06 DIAGNOSIS — I5020 Unspecified systolic (congestive) heart failure: Secondary | ICD-10-CM | POA: Diagnosis present

## 2020-02-06 DIAGNOSIS — E1122 Type 2 diabetes mellitus with diabetic chronic kidney disease: Secondary | ICD-10-CM | POA: Diagnosis not present

## 2020-02-06 DIAGNOSIS — M19041 Primary osteoarthritis, right hand: Secondary | ICD-10-CM | POA: Diagnosis not present

## 2020-02-06 DIAGNOSIS — Z951 Presence of aortocoronary bypass graft: Secondary | ICD-10-CM | POA: Diagnosis not present

## 2020-02-06 DIAGNOSIS — Z79899 Other long term (current) drug therapy: Secondary | ICD-10-CM | POA: Insufficient documentation

## 2020-02-06 DIAGNOSIS — Z8719 Personal history of other diseases of the digestive system: Secondary | ICD-10-CM

## 2020-02-06 DIAGNOSIS — I10 Essential (primary) hypertension: Secondary | ICD-10-CM | POA: Diagnosis present

## 2020-02-06 DIAGNOSIS — I110 Hypertensive heart disease with heart failure: Secondary | ICD-10-CM | POA: Diagnosis not present

## 2020-02-06 DIAGNOSIS — K219 Gastro-esophageal reflux disease without esophagitis: Secondary | ICD-10-CM | POA: Diagnosis present

## 2020-02-06 DIAGNOSIS — I5043 Acute on chronic combined systolic (congestive) and diastolic (congestive) heart failure: Secondary | ICD-10-CM | POA: Insufficient documentation

## 2020-02-06 DIAGNOSIS — Z7982 Long term (current) use of aspirin: Secondary | ICD-10-CM | POA: Diagnosis not present

## 2020-02-06 DIAGNOSIS — I2510 Atherosclerotic heart disease of native coronary artery without angina pectoris: Secondary | ICD-10-CM | POA: Diagnosis not present

## 2020-02-06 DIAGNOSIS — I482 Chronic atrial fibrillation, unspecified: Secondary | ICD-10-CM | POA: Diagnosis present

## 2020-02-06 DIAGNOSIS — I5033 Acute on chronic diastolic (congestive) heart failure: Secondary | ICD-10-CM | POA: Diagnosis present

## 2020-02-06 DIAGNOSIS — N184 Chronic kidney disease, stage 4 (severe): Secondary | ICD-10-CM | POA: Insufficient documentation

## 2020-02-06 DIAGNOSIS — Z006 Encounter for examination for normal comparison and control in clinical research program: Secondary | ICD-10-CM | POA: Diagnosis not present

## 2020-02-06 DIAGNOSIS — Z8261 Family history of arthritis: Secondary | ICD-10-CM | POA: Insufficient documentation

## 2020-02-06 DIAGNOSIS — Z9889 Other specified postprocedural states: Secondary | ICD-10-CM

## 2020-02-06 HISTORY — PX: TRANSCATHETER MITRAL EDGE TO EDGE REPAIR: CATH118311

## 2020-02-06 HISTORY — DX: Other specified postprocedural states: Z98.890

## 2020-02-06 LAB — GLUCOSE, CAPILLARY
Glucose-Capillary: 207 mg/dL — ABNORMAL HIGH (ref 70–99)
Glucose-Capillary: 179 mg/dL — ABNORMAL HIGH (ref 70–99)
Glucose-Capillary: 196 mg/dL — ABNORMAL HIGH (ref 70–99)
Glucose-Capillary: 203 mg/dL — ABNORMAL HIGH (ref 70–99)
Glucose-Capillary: 182 mg/dL — ABNORMAL HIGH (ref 70–99)

## 2020-02-06 LAB — PROTIME-INR
INR: 1.2 (ref 0.8–1.2)
Prothrombin Time: 14.6 seconds (ref 11.4–15.2)

## 2020-02-06 LAB — APTT: aPTT: 32 seconds (ref 24–36)

## 2020-02-06 LAB — PREPARE RBC (CROSSMATCH)

## 2020-02-06 LAB — POCT ACTIVATED CLOTTING TIME: Activated Clotting Time: 252 seconds

## 2020-02-06 SURGERY — MITRAL VALVE REPAIR
Anesthesia: General

## 2020-02-06 MED ORDER — ACETAMINOPHEN 325 MG PO TABS: 650.0000 mg | ORAL_TABLET | ORAL | Status: DC | PRN

## 2020-02-06 MED ORDER — ROCURONIUM BROMIDE 50 MG/5ML IV SOSY
PREFILLED_SYRINGE | INTRAVENOUS | Status: DC | PRN
  Administered 2020-02-06 (×2): 10 mg via INTRAVENOUS
  Administered 2020-02-06: 40 mg via INTRAVENOUS

## 2020-02-06 MED ORDER — SUCCINYLCHOLINE CHLORIDE 20 MG/ML IJ SOLN
INTRAMUSCULAR | Status: DC | PRN
  Administered 2020-02-06: 140 mg via INTRAVENOUS

## 2020-02-06 MED ORDER — SODIUM CHLORIDE 0.9% FLUSH: 3.0000 mL | INTRAVENOUS | Status: DC | PRN

## 2020-02-06 MED ORDER — CHLORHEXIDINE GLUCONATE 4 % EX LIQD: 30.0000 mL | CUTANEOUS | Status: DC

## 2020-02-06 MED ORDER — GLYCOPYRROLATE PF 0.2 MG/ML IJ SOSY
PREFILLED_SYRINGE | INTRAMUSCULAR | Status: DC | PRN
  Administered 2020-02-06: .2 mg via INTRAVENOUS

## 2020-02-06 MED ORDER — HYDRALAZINE HCL 20 MG/ML IJ SOLN: 10.0000 mg | INTRAMUSCULAR | Status: DC | PRN

## 2020-02-06 MED ORDER — POTASSIUM GLUCONATE 595 MG PO CAPS: 595.0000 mg | ORAL_CAPSULE | Freq: Every day | ORAL | Status: DC

## 2020-02-06 MED ORDER — CHLORHEXIDINE GLUCONATE 4 % EX LIQD: 60.0000 mL | Freq: Once | CUTANEOUS | Status: DC

## 2020-02-06 MED ORDER — PHENYLEPHRINE HCL-NACL 10-0.9 MG/250ML-% IV SOLN
INTRAVENOUS | Status: DC | PRN
  Administered 2020-02-06: 25 ug/min via INTRAVENOUS

## 2020-02-06 MED ORDER — PROPOFOL 10 MG/ML IV BOLUS
INTRAVENOUS | Status: DC | PRN
  Administered 2020-02-06: 70 mg via INTRAVENOUS
  Administered 2020-02-06: 30 mg via INTRAVENOUS

## 2020-02-06 MED ORDER — ASPIRIN EC 81 MG PO TBEC
81.0000 mg | DELAYED_RELEASE_TABLET | Freq: Every day | ORAL | Status: DC
  Administered 2020-02-06 – 2020-02-07 (×2): 81 mg via ORAL
  Filled 2020-02-06 (×2): qty 1

## 2020-02-06 MED ORDER — SODIUM CHLORIDE 0.9 % IV SOLN: INTRAVENOUS | Status: DC

## 2020-02-06 MED ORDER — INSULIN GLARGINE 100 UNIT/ML ~~LOC~~ SOLN
20.0000 [IU] | Freq: Every day | SUBCUTANEOUS | Status: DC
  Administered 2020-02-06: 20 [IU] via SUBCUTANEOUS
  Filled 2020-02-06 (×2): qty 0.2

## 2020-02-06 MED ORDER — HEPARIN (PORCINE) IN NACL 2000-0.9 UNIT/L-% IV SOLN
INTRAVENOUS | Status: DC | PRN
  Administered 2020-02-06 (×3): 1000 mL

## 2020-02-06 MED ORDER — ROSUVASTATIN CALCIUM 20 MG PO TABS: 40.0000 mg | ORAL_TABLET | Freq: Every day | ORAL | Status: DC

## 2020-02-06 MED ORDER — SODIUM CHLORIDE 0.9% IV SOLUTION: Freq: Once | INTRAVENOUS | Status: DC

## 2020-02-06 MED ORDER — METOPROLOL TARTRATE 50 MG PO TABS
50.0000 mg | ORAL_TABLET | Freq: Two times a day (BID) | ORAL | Status: DC
  Administered 2020-02-06 – 2020-02-07 (×2): 50 mg via ORAL
  Filled 2020-02-06 (×3): qty 1

## 2020-02-06 MED ORDER — CHLORHEXIDINE GLUCONATE 0.12 % MT SOLN
15.0000 mL | Freq: Once | OROMUCOSAL | Status: AC
  Administered 2020-02-06: 15 mL via OROMUCOSAL
  Filled 2020-02-06: qty 15

## 2020-02-06 MED ORDER — LACTATED RINGERS IV SOLN: INTRAVENOUS | Status: DC | PRN

## 2020-02-06 MED ORDER — EPHEDRINE SULFATE 50 MG/ML IJ SOLN
INTRAMUSCULAR | Status: DC | PRN
  Administered 2020-02-06: 5 mg via INTRAVENOUS
  Administered 2020-02-06 (×2): 10 mg via INTRAVENOUS

## 2020-02-06 MED ORDER — ISOSORBIDE MONONITRATE ER 60 MG PO TB24
90.0000 mg | ORAL_TABLET | Freq: Every morning | ORAL | Status: DC
  Administered 2020-02-07: 90 mg via ORAL
  Filled 2020-02-06: qty 1
  Filled 2020-02-06: qty 2

## 2020-02-06 MED ORDER — HEPARIN SODIUM (PORCINE) 1000 UNIT/ML IJ SOLN
INTRAMUSCULAR | Status: DC | PRN
  Administered 2020-02-06: 7000 [IU] via INTRAVENOUS
  Administered 2020-02-06: 2000 [IU] via INTRAVENOUS

## 2020-02-06 MED ORDER — HYDRALAZINE HCL 25 MG PO TABS
25.0000 mg | ORAL_TABLET | Freq: Two times a day (BID) | ORAL | Status: DC
  Administered 2020-02-06 – 2020-02-07 (×2): 25 mg via ORAL
  Filled 2020-02-06 (×3): qty 1

## 2020-02-06 MED ORDER — SODIUM CHLORIDE 0.9% FLUSH
3.0000 mL | Freq: Two times a day (BID) | INTRAVENOUS | Status: DC
  Administered 2020-02-06 – 2020-02-07 (×3): 3 mL via INTRAVENOUS

## 2020-02-06 MED ORDER — ISOSORBIDE MONONITRATE ER 60 MG PO TB24
60.0000 mg | ORAL_TABLET | Freq: Every day | ORAL | Status: DC
  Administered 2020-02-06: 60 mg via ORAL
  Filled 2020-02-06 (×2): qty 1

## 2020-02-06 MED ORDER — APIXABAN 2.5 MG PO TABS
2.5000 mg | ORAL_TABLET | Freq: Two times a day (BID) | ORAL | Status: DC
  Administered 2020-02-06 – 2020-02-07 (×2): 2.5 mg via ORAL
  Filled 2020-02-06 (×2): qty 1

## 2020-02-06 MED ORDER — LIDOCAINE 2% (20 MG/ML) 5 ML SYRINGE
INTRAMUSCULAR | Status: DC | PRN
  Administered 2020-02-06: 80 mg via INTRAVENOUS

## 2020-02-06 MED ORDER — SODIUM CHLORIDE 0.9 % IV SOLN: 1.5000 g | INTRAVENOUS | Status: DC

## 2020-02-06 MED ORDER — SUGAMMADEX SODIUM 200 MG/2ML IV SOLN
INTRAVENOUS | Status: DC | PRN
  Administered 2020-02-06: 40 mg via INTRAVENOUS
  Administered 2020-02-06: 160 mg via INTRAVENOUS

## 2020-02-06 MED ORDER — ONDANSETRON HCL 4 MG/2ML IJ SOLN: 4.0000 mg | Freq: Four times a day (QID) | INTRAMUSCULAR | Status: DC | PRN

## 2020-02-06 MED ORDER — VANCOMYCIN HCL 1500 MG/300ML IV SOLN
1500.0000 mg | INTRAVENOUS | Status: AC
  Administered 2020-02-06: 1500 mg via INTRAVENOUS
  Filled 2020-02-06: qty 300

## 2020-02-06 MED ORDER — RANOLAZINE ER 500 MG PO TB12
500.0000 mg | ORAL_TABLET | Freq: Two times a day (BID) | ORAL | Status: DC
  Administered 2020-02-06 – 2020-02-07 (×2): 500 mg via ORAL
  Filled 2020-02-06 (×2): qty 1

## 2020-02-06 MED ORDER — SODIUM CHLORIDE 0.9 % IV SOLN: 250.0000 mL | INTRAVENOUS | Status: DC | PRN

## 2020-02-06 MED ORDER — INSULIN ASPART 100 UNIT/ML ~~LOC~~ SOLN
0.0000 [IU] | Freq: Three times a day (TID) | SUBCUTANEOUS | Status: DC
  Administered 2020-02-06: 5 [IU] via SUBCUTANEOUS
  Administered 2020-02-06: 3 [IU] via SUBCUTANEOUS
  Administered 2020-02-07: 2 [IU] via SUBCUTANEOUS

## 2020-02-06 MED ORDER — FENTANYL CITRATE (PF) 250 MCG/5ML IJ SOLN
INTRAMUSCULAR | Status: DC | PRN
  Administered 2020-02-06: 50 ug via INTRAVENOUS

## 2020-02-06 MED ORDER — ONDANSETRON HCL 4 MG/2ML IJ SOLN
INTRAMUSCULAR | Status: DC | PRN
  Administered 2020-02-06: 4 mg via INTRAVENOUS

## 2020-02-06 MED ORDER — FUROSEMIDE 80 MG PO TABS
80.0000 mg | ORAL_TABLET | Freq: Two times a day (BID) | ORAL | Status: DC
  Administered 2020-02-06 – 2020-02-07 (×2): 80 mg via ORAL
  Filled 2020-02-06 (×2): qty 1

## 2020-02-06 MED ORDER — PROTAMINE SULFATE 10 MG/ML IV SOLN
INTRAVENOUS | Status: DC | PRN
  Administered 2020-02-06 (×5): 10 mg via INTRAVENOUS

## 2020-02-06 MED ORDER — VITAMIN D 25 MCG (1000 UNIT) PO TABS
1000.0000 [IU] | ORAL_TABLET | Freq: Every day | ORAL | Status: DC
  Administered 2020-02-07: 1000 [IU] via ORAL
  Filled 2020-02-06: qty 1

## 2020-02-06 SURGICAL SUPPLY — 21 items
CATH MITRA STEERABLE GUIDE (CATHETERS) ×2 IMPLANT
CLIP MITRA G4 DELIVERY SYS NTW (Clip) ×2 IMPLANT
DEVICE CLOSURE PERCLS PRGLD 6F (VASCULAR PRODUCTS) IMPLANT
ELECT DEFIB PAD ADLT CADENCE (PAD) ×2 IMPLANT
GUIDEWIRE SAFE TJ AMPLATZ EXST (WIRE) ×2 IMPLANT
KIT DILATOR VASC 18G NDL (KITS) ×2 IMPLANT
KIT HEART LEFT (KITS) ×6 IMPLANT
KIT MICROPUNCTURE NIT STIFF (SHEATH) ×2 IMPLANT
KIT VERSACROSS FXD (WIRE) ×2 IMPLANT
PACK CARDIAC CATHETERIZATION (CUSTOM PROCEDURE TRAY) ×3 IMPLANT
PERCLOSE PROGLIDE 6F (VASCULAR PRODUCTS) ×3
SHEATH PINNACLE 8F 10CM (SHEATH) ×2 IMPLANT
SHEATH PROBE COVER 6X72 (BAG) ×3 IMPLANT
SHIELD RADPAD SCOOP 12X17 (MISCELLANEOUS) ×2 IMPLANT
STOPCOCK MORSE 400PSI 3WAY (MISCELLANEOUS) ×18 IMPLANT
SYSTEM MITRACLIP G4 (SYSTAGENIX WOUND MANAGEMENT) ×2 IMPLANT
TRANSDUCER W/STOPCOCK (MISCELLANEOUS) ×3 IMPLANT
TUBING ART PRESS 72  MALE/FEM (TUBING) ×6
TUBING ART PRESS 72 MALE/FEM (TUBING) ×1 IMPLANT
TUBING CIL FLEX 10 FLL-RA (TUBING) ×2 IMPLANT
WIRE EMERALD 3MM-J .035X150CM (WIRE) ×2 IMPLANT

## 2020-02-06 NOTE — Anesthesia Procedure Notes (Signed)
Procedure Name: Intubation Date/Time: 02/06/2020 7:43 AM Performed by: Glynda Jaeger, CRNA Pre-anesthesia Checklist: Patient identified, Patient being monitored, Timeout performed, Emergency Drugs available and Suction available Patient Re-evaluated:Patient Re-evaluated prior to induction Oxygen Delivery Method: Circle System Utilized Preoxygenation: Pre-oxygenation with 100% oxygen Induction Type: IV induction Ventilation: Mask ventilation without difficulty Laryngoscope Size: Mac and 4 Grade View: Grade I Tube type: Oral Tube size: 7.5 mm Number of attempts: 1 Airway Equipment and Method: Stylet Placement Confirmation: ETT inserted through vocal cords under direct vision,  positive ETCO2 and breath sounds checked- equal and bilateral Secured at: 23 cm Tube secured with: Tape Dental Injury: Teeth and Oropharynx as per pre-operative assessment

## 2020-02-06 NOTE — Anesthesia Procedure Notes (Signed)
Arterial Line Insertion Start/End3/25/2021 6:55 AM, 02/06/2020 7:02 AM Performed by: Verdie Drown, CRNA, CRNA  Patient location: Pre-op. Preanesthetic checklist: patient identified, IV checked, site marked, risks and benefits discussed, surgical consent, monitors and equipment checked, pre-op evaluation, timeout performed and anesthesia consent Lidocaine 1% used for infiltration Right, radial was placed Catheter size: 20 G Hand hygiene performed  and maximum sterile barriers used  Allen's test indicative of satisfactory collateral circulation Attempts: 1 Procedure performed without using ultrasound guided technique. Following insertion, dressing applied and Biopatch. Post procedure assessment: normal  Patient tolerated the procedure well with no immediate complications.

## 2020-02-06 NOTE — Plan of Care (Signed)

## 2020-02-06 NOTE — Progress Notes (Signed)
Site: Right wrist Sheath Size: Aline Condition prior to removal:  Level 0 Type of pressure held: manual Time pressure held: 10 minutes Status of patient during pull: stable Condition of site post pull:  Level 0 Type of dressing applied: gauze w/ transparent Pulses verified: 2+ Patient's condition post pull: stable Bedrest begins at: n/a Post instructions given to patient:

## 2020-02-06 NOTE — Interval H&P Note (Signed)
History and Physical Interval Note:  02/06/2020 6:02 AM  [name]  has presented today for surgery, with the diagnosis of Severe Mitral Insufficiency.  The various methods of treatment have been discussed with the patient and family. After consideration of risks, benefits and other options for treatment, the patient has consented to  Procedure(s): MITRAL VALVE REPAIR (N/A) as a surgical intervention.  The patient's history has been reviewed, patient examined, no change in status, stable for surgery.  I have reviewed the patient's chart and labs.  Questions were answered to the patient's satisfaction.    Pt with pulmonary vascular congestion and elevated BNP, consistent with acute on chronic combined systolic and diastolic heart failure, NYHA 3 symptoms. Presenting for transcatheter edge to edge mitral valve repair.   Sherren Mocha

## 2020-02-06 NOTE — Progress Notes (Signed)
  Echocardiogram Echocardiogram Transesophageal has been performed.  [name] M 02/06/2020, 10:03 AM

## 2020-02-06 NOTE — Discharge Instructions (Signed)
Home Care Following Your MitraClip Procedure      If you have any questions or concerns you can call the structural heart office at [phone] during normal business hours 8am-4pm. If you have an urgent need after hours or on the weekend, please call [phone] to talk to the on call provider for general cardiology. If you have an emergency that requires immediate attention, please call 911.   Groin Site Care Refer to this sheet in the next few weeks. These instructions provide you with information on caring for yourself after your procedure. Your caregiver may also give you more specific instructions. Your treatment has been planned according to current medical practices, but problems sometimes occur. Call your caregiver if you have any problems or questions after your procedure. HOME CARE INSTRUCTIONS  You may shower 24 hours after the procedure. Remove the bandage (dressing) and gently wash the site with plain soap and water. Gently pat the site dry.   Do not apply powder or lotion to the site.   Do not sit in a bathtub, swimming pool, or whirlpool for 5 to 7 days.   No bending, squatting, or lifting anything over 10 pounds (4.5 kg) as directed by your caregiver.   Inspect the site at least twice daily.   Do not drive home if you are discharged the same day of the procedure. Have someone else drive you.   You may drive 72 hours after the procedure unless otherwise instructed by your caregiver.  What to expect:  Any bruising will usually fade within 1 to 2 weeks.   Blood that collects in the tissue (hematoma) may be painful to the touch. It should usually decrease in size and tenderness within 1 to 2 weeks.  SEEK IMMEDIATE MEDICAL CARE IF:  You have unusual pain at the groin site or down the affected leg.   You have redness, warmth, swelling, or pain at the groin site.   You have drainage (other than a small amount of blood on the dressing).   You have chills.   You have a  fever or persistent symptoms for more than 72 hours.   You have a fever and your symptoms suddenly get worse.   Your leg becomes pale, cool, tingly, or numb.   You have bleeding from the site. Hold pressure on the site until it subsides.    After MitraClip Checklist  Check  Test Description   Follow up appointment in 1-2 weeks  Most of our patients will see our structural heart physician assistant, Katie Harland Aguiniga, or your primary cardiologist within 1-2 weeks. Your incision site will be checked and you will be cleared to resume all normal activities if you are doing well.     1 month echo and follow up  You will have an echo to check on your heart valve clip and be seen back in the office by Katie Cung Masterson PA-C.   Follow up with your primary cardiologist You will need to be seen by your primary cardiologist in the following 3-6 months after your 1 month appointment in the valve clinic. Often times your Plavix or Aspirin will be discontinued during this time, but this is decided on a case by case basis.    1 year echo and follow up You will have another echo to check on your heart valve after one year and be seen back in the office by Katie Larenz Frasier. This your last structural heart visit.   Bacterial endocarditis prophylaxis  You will   have to take antibiotics for the rest of your life before all dental procedures (even dental cleanings) to protect your heart valve from potential infection. Antibiotics are also required before some surgeries. Please check with your cardiologist before scheduling any surgeries. Also, please make sure to tell us if you have a penicillin allergy as you will require an alternative antibiotic.    ______________  Your Implant Identification Card Following your procedure, you will receive an Implant Identification Card, which your doctor will fill out and which you must carry with you at all times. Show your Implant Identification Card if you report to an emergency room.  This card identifies you as a patient who has had a MitraClip device implanted. If you require a magnetic resonance imaging (MRI) scan, tell your doctor or MRI technician that you have a MitraClip device implanted. Test results indicate that patients with the MitraClip device can safely undergo MRI scans under certain conditions described on the card.  

## 2020-02-06 NOTE — Transfer of Care (Signed)
Immediate Anesthesia Transfer of Care Note  Patient: [name]  Procedure(s) Performed: MITRAL VALVE REPAIR (N/A )  Patient Location: Cath Lab  Anesthesia Type:General  Level of Consciousness: awake, alert , oriented, patient cooperative and responds to stimulation  Airway & Oxygen Therapy: Patient Spontanous Breathing and Patient connected to face mask oxygen  Post-op Assessment: Report given to RN, Post -op Vital signs reviewed and stable and Patient moving all extremities X 4  Post vital signs: Reviewed and stable  Last Vitals:  Vitals Value Taken Time  BP 133/67 02/06/20 1023  Temp 35.9 C 02/06/20 1020  Pulse 59 02/06/20 1024  Resp 9 02/06/20 1024  SpO2 100 % 02/06/20 1024  Vitals shown include unvalidated device data.  Last Pain:  Vitals:   02/06/20 1020  TempSrc: Temporal  PainSc: 3       Patients Stated Pain Goal: 0 (36/43/83 [id number])  Complications: No apparent anesthesia complications

## 2020-02-07 ENCOUNTER — Encounter (HOSPITAL_COMMUNITY): Payer: Self-pay | Admitting: Cardiovascular Disease

## 2020-02-07 ENCOUNTER — Inpatient Hospital Stay (HOSPITAL_BASED_OUTPATIENT_CLINIC_OR_DEPARTMENT_OTHER): Payer: Medicare Other

## 2020-02-07 DIAGNOSIS — M170 Bilateral primary osteoarthritis of knee: Secondary | ICD-10-CM | POA: Diagnosis not present

## 2020-02-07 DIAGNOSIS — E1122 Type 2 diabetes mellitus with diabetic chronic kidney disease: Secondary | ICD-10-CM | POA: Diagnosis not present

## 2020-02-07 DIAGNOSIS — M19072 Primary osteoarthritis, left ankle and foot: Secondary | ICD-10-CM | POA: Diagnosis not present

## 2020-02-07 DIAGNOSIS — I259 Chronic ischemic heart disease, unspecified: Secondary | ICD-10-CM | POA: Diagnosis not present

## 2020-02-07 DIAGNOSIS — I340 Nonrheumatic mitral (valve) insufficiency: Secondary | ICD-10-CM

## 2020-02-07 DIAGNOSIS — E1142 Type 2 diabetes mellitus with diabetic polyneuropathy: Secondary | ICD-10-CM | POA: Diagnosis not present

## 2020-02-07 DIAGNOSIS — I080 Rheumatic disorders of both mitral and aortic valves: Secondary | ICD-10-CM | POA: Diagnosis not present

## 2020-02-07 DIAGNOSIS — M19071 Primary osteoarthritis, right ankle and foot: Secondary | ICD-10-CM | POA: Diagnosis not present

## 2020-02-07 DIAGNOSIS — I2510 Atherosclerotic heart disease of native coronary artery without angina pectoris: Secondary | ICD-10-CM | POA: Diagnosis not present

## 2020-02-07 DIAGNOSIS — M19042 Primary osteoarthritis, left hand: Secondary | ICD-10-CM | POA: Diagnosis not present

## 2020-02-07 DIAGNOSIS — M19041 Primary osteoarthritis, right hand: Secondary | ICD-10-CM | POA: Diagnosis not present

## 2020-02-07 DIAGNOSIS — Z9889 Other specified postprocedural states: Secondary | ICD-10-CM

## 2020-02-07 DIAGNOSIS — Z951 Presence of aortocoronary bypass graft: Secondary | ICD-10-CM | POA: Diagnosis not present

## 2020-02-07 DIAGNOSIS — I4821 Permanent atrial fibrillation: Secondary | ICD-10-CM | POA: Diagnosis not present

## 2020-02-07 DIAGNOSIS — I5033 Acute on chronic diastolic (congestive) heart failure: Secondary | ICD-10-CM

## 2020-02-07 LAB — TYPE AND SCREEN
ABO/RH(D): O NEG
Antibody Screen: NEGATIVE
Donor AG Type: NEGATIVE
Donor AG Type: NEGATIVE
Unit division: 0
Unit division: 0

## 2020-02-07 LAB — CBC
HCT: 37.4 % — ABNORMAL LOW (ref 39.0–52.0)
Hemoglobin: 11.8 g/dL — ABNORMAL LOW (ref 13.0–17.0)
MCH: 30.8 pg (ref 26.0–34.0)
MCHC: 31.6 g/dL (ref 30.0–36.0)
MCV: 97.7 fL (ref 80.0–100.0)
Platelets: 114 10*3/uL — ABNORMAL LOW (ref 150–400)
RBC: 3.83 MIL/uL — ABNORMAL LOW (ref 4.22–5.81)
RDW: 15.6 % — ABNORMAL HIGH (ref 11.5–15.5)
WBC: 7.5 10*3/uL (ref 4.0–10.5)
nRBC: 0 % (ref 0.0–0.2)

## 2020-02-07 LAB — BPAM RBC
Blood Product Expiration Date: 202104242359
Blood Product Expiration Date: 202104272359
Unit Type and Rh: 9500
Unit Type and Rh: 9500

## 2020-02-07 LAB — BASIC METABOLIC PANEL
Anion gap: 10 (ref 5–15)
BUN: 31 mg/dL — ABNORMAL HIGH (ref 8–23)
CO2: 28 mmol/L (ref 22–32)
Calcium: 9.5 mg/dL (ref 8.9–10.3)
Chloride: 101 mmol/L (ref 98–111)
Creatinine, Ser: 2.3 mg/dL — ABNORMAL HIGH (ref 0.61–1.24)
GFR calc Af Amer: 29 mL/min — ABNORMAL LOW (ref >60)
GFR calc non Af Amer: 25 mL/min — ABNORMAL LOW (ref >60)
Glucose, Bld: 175 mg/dL — ABNORMAL HIGH (ref 70–99)
Potassium: 4.1 mmol/L (ref 3.5–5.1)
Sodium: 139 mmol/L (ref 135–145)

## 2020-02-07 LAB — GLUCOSE, CAPILLARY
Glucose-Capillary: 167 mg/dL — ABNORMAL HIGH (ref 70–99)
Glucose-Capillary: 292 mg/dL — ABNORMAL HIGH (ref 70–99)

## 2020-02-07 NOTE — Plan of Care (Signed)
Problem: Education: Goal: Knowledge of General Education information will improve Description: Including pain rating scale, medication(s)/side effects and non-pharmacologic comfort measures 02/07/2020 0943 by Don Perking, RN Outcome: Completed/Met 02/07/2020 0755 by Don Perking, RN Outcome: Progressing   Problem: Health Behavior/Discharge Planning: Goal: Ability to manage health-related needs will improve 02/07/2020 0943 by Don Perking, RN Outcome: Completed/Met 02/07/2020 0755 by Don Perking, RN Outcome: Progressing   Problem: Clinical Measurements: Goal: Ability to maintain clinical measurements within normal limits will improve 02/07/2020 0943 by Don Perking, RN Outcome: Completed/Met 02/07/2020 0755 by Don Perking, RN Outcome: Progressing Goal: Will remain free from infection 02/07/2020 0943 by Don Perking, RN Outcome: Completed/Met 02/07/2020 0755 by Don Perking, RN Outcome: Progressing Goal: Diagnostic test results will improve 02/07/2020 0943 by Don Perking, RN Outcome: Completed/Met 02/07/2020 0755 by Don Perking, RN Outcome: Progressing Goal: Respiratory complications will improve 02/07/2020 0943 by Don Perking, RN Outcome: Completed/Met 02/07/2020 0755 by Don Perking, RN Outcome: Progressing Goal: Cardiovascular complication will be avoided 02/07/2020 0943 by Don Perking, RN Outcome: Completed/Met 02/07/2020 0755 by Don Perking, RN Outcome: Progressing   Problem: Activity: Goal: Risk for activity intolerance will decrease 02/07/2020 0943 by Don Perking, RN Outcome: Completed/Met 02/07/2020 0755 by Don Perking, RN Outcome: Progressing   Problem: Nutrition: Goal: Adequate nutrition will be maintained 02/07/2020 0943 by Don Perking, RN Outcome: Completed/Met 02/07/2020 0755 by Don Perking, RN Outcome: Progressing   Problem: Coping: Goal: Level  of anxiety will decrease 02/07/2020 0943 by Don Perking, RN Outcome: Completed/Met 02/07/2020 0755 by Don Perking, RN Outcome: Progressing   Problem: Elimination: Goal: Will not experience complications related to bowel motility 02/07/2020 0943 by Don Perking, RN Outcome: Completed/Met 02/07/2020 0755 by Don Perking, RN Outcome: Progressing Goal: Will not experience complications related to urinary retention 02/07/2020 0943 by Don Perking, RN Outcome: Completed/Met 02/07/2020 0755 by Don Perking, RN Outcome: Progressing   Problem: Pain Managment: Goal: General experience of comfort will improve 02/07/2020 0943 by Don Perking, RN Outcome: Completed/Met 02/07/2020 0755 by Don Perking, RN Outcome: Progressing   Problem: Safety: Goal: Ability to remain free from injury will improve 02/07/2020 0943 by Don Perking, RN Outcome: Completed/Met 02/07/2020 0755 by Don Perking, RN Outcome: Progressing   Problem: Skin Integrity: Goal: Risk for impaired skin integrity will decrease 02/07/2020 0943 by Don Perking, RN Outcome: Completed/Met 02/07/2020 0755 by Don Perking, RN Outcome: Progressing   Problem: Education: Goal: Will demonstrate proper wound care and an understanding of methods to prevent future damage 02/07/2020 0943 by Don Perking, RN Outcome: Completed/Met 02/07/2020 0755 by Don Perking, RN Outcome: Progressing Goal: Knowledge of disease or condition will improve 02/07/2020 0943 by Don Perking, RN Outcome: Completed/Met 02/07/2020 0755 by Don Perking, RN Outcome: Progressing Goal: Knowledge of the prescribed therapeutic regimen will improve 02/07/2020 0943 by Don Perking, RN Outcome: Completed/Met 02/07/2020 0755 by Don Perking, RN Outcome: Progressing Goal: Individualized Educational Video(s) 02/07/2020 0943 by Don Perking, RN Outcome:  Completed/Met 02/07/2020 0755 by Don Perking, RN Outcome: Progressing   Problem: Activity: Goal: Risk for activity intolerance will decrease 02/07/2020 0943 by Don Perking, RN Outcome: Completed/Met 02/07/2020 0755 by Don Perking, RN Outcome: Progressing   Problem: Cardiac: Goal: Will achieve and/or maintain hemodynamic stability 02/07/2020 0943 by Don Perking, RN Outcome: Completed/Met 02/07/2020 [phone]  by Don Perking, RN Outcome: Progressing   Problem: Clinical Measurements: Goal: Postoperative complications will be avoided or minimized 02/07/2020 0943 by Don Perking, RN Outcome: Completed/Met 02/07/2020 0755 by Don Perking, RN Outcome: Progressing   Problem: Respiratory: Goal: Respiratory status will improve 02/07/2020 0943 by Don Perking, RN Outcome: Completed/Met 02/07/2020 0755 by Don Perking, RN Outcome: Progressing   Problem: Skin Integrity: Goal: Wound healing without signs and symptoms of infection 02/07/2020 0943 by Don Perking, RN Outcome: Completed/Met 02/07/2020 0755 by Don Perking, RN Outcome: Progressing Goal: Risk for impaired skin integrity will decrease 02/07/2020 0943 by Don Perking, RN Outcome: Completed/Met 02/07/2020 0755 by Don Perking, RN Outcome: Progressing   Problem: Urinary Elimination: Goal: Ability to achieve and maintain adequate renal perfusion and functioning will improve 02/07/2020 0943 by Don Perking, RN Outcome: Completed/Met 02/07/2020 0755 by Don Perking, RN Outcome: Progressing

## 2020-02-07 NOTE — Progress Notes (Signed)
CARDIAC REHAB PHASE I   PRE:  Rate/Rhythm: 79 afib    BP: sitting 128/57    SaO2: 98 RA  MODE:  Ambulation: 410 ft   POST:  Rate/Rhythm: 107 afib    BP: sitting 148/80     SaO2: 90 RA  Pt able to stand independently and walk with rollator (no RW available). Steady, SOB better per pt. HR stable. SaO2 low upon return to room but up with rest. Encouraged pursed lip breathing when he walks at home. Gave walking guidelines. Not interested in CRPII. Pt has RW at home if he needs it. [place], ACSM 02/07/2020 9:56 AM

## 2020-02-07 NOTE — Care Management Obs Status (Signed)
MEDICARE OBSERVATION STATUS NOTIFICATION   Patient Details  Name: [name] MRN: [id number] Date of Birth: 1932-10-16   Medicare Observation Status Notification Given:  Yes    Zenon Mayo, RN 02/07/2020, 10:40 AM

## 2020-02-07 NOTE — Anesthesia Postprocedure Evaluation (Signed)
Anesthesia Post Note  Patient: [name]  Procedure(s) Performed: MITRAL VALVE REPAIR (N/A )     Patient location during evaluation: PACU Anesthesia Type: General Level of consciousness: awake and alert Pain management: pain level controlled Vital Signs Assessment: post-procedure vital signs reviewed and stable Respiratory status: spontaneous breathing, nonlabored ventilation, respiratory function stable and patient connected to nasal cannula oxygen Cardiovascular status: blood pressure returned to baseline and stable Postop Assessment: no apparent nausea or vomiting Anesthetic complications: no    Last Vitals:  Vitals:   02/07/20 0312 02/07/20 0742  BP: 128/65 130/65  Pulse: 78 73  Resp: 15 19  Temp: 36.7 C 36.9 C  SpO2: 97% 95%    Last Pain:  Vitals:   02/07/20 0742  TempSrc: Oral  PainSc: 0-No pain                 Lorelei Heikkila S

## 2020-02-07 NOTE — Plan of Care (Signed)

## 2020-02-07 NOTE — Care Management CC44 (Signed)
Condition Code 44 Documentation Completed  Patient Details  Name: DONSHAY LUPINSKI MRN: [id number] Date of Birth: 1932/03/25   Condition Code 44 given:  Yes Patient signature on Condition Code 44 notice:  Yes Documentation of 2 MD's agreement:  Yes Code 44 added to claim:  Yes    Zenon Mayo, RN 02/07/2020, 10:40 AM

## 2020-02-07 NOTE — Progress Notes (Signed)
Echocardiogram 2D Echocardiogram has been performed.  Oneal Deputy Tallie Hevia 02/07/2020, 8:25 AM

## 2020-02-07 NOTE — Progress Notes (Addendum)
Explained and discussed discharge instructions to pt. Aware of follow up appointments, continue current medications. No complaints voiced. Going home with wife and belongings.

## 2020-02-07 NOTE — Discharge Summary (Addendum)
[facility]  Discharge Summary    Patient ID: [name] MRN: [id number]; DOB: [birth year]  Admit date: 02/06/2020 Discharge date: 02/07/2020  Primary Care Provider: Tonia Ghent, MD  Primary Cardiologist: Quay Burow, MD / Dr. Haroldine Laws  Discharge Diagnoses    Principal Problem:   S/P mitral valve repair Active Problems:   Peripheral sensory neuropathy due to type 2 diabetes mellitus ([place])   HLD (hyperlipidemia)   Hypertensive heart disease   GERD   Sleep apnea   CRI (chronic renal insufficiency), stage 4 (severe) ([name])   Carotid artery disease ([name])   Hx of CABG   Non-rheumatic mitral regurgitation   RBBB (right bundle branch block)   Acute on chronic diastolic (congestive) heart failure ([name])   History of lower GI bleeding   CAD (coronary artery disease)   Chronic atrial fibrillation ([name])   Severe mitral insufficiency   Allergies No Known Allergies  Diagnostic Studies/Procedures    02/06/20 Conclusion Successful transcatheter edge-to-edge mitral valve repair using a single MitraClip NTW device placed A2/P2, reducing mitral regurgitation from 4+ to 1+  Recommendations  Antiplatelet/Anticoag Recommend to resume Apixaban, at currently prescribed dose and frequency on 02/07/2020. Concurrent antiplatelet therapy not recommended.    _____________   Echo 02/07/20: complete but pending formal read at the time of discharge    History of Present Illness     [name] is a 86 y.o. male with a history of HTN, CAD s/p CABG, GI bleeding s/p sigmoid colectomy, chronic afib on Eliquis, CKD stage IV, DMT2, OSA, chronic combined S/D CHF and severe MR who presented to [facility] on 02/06/20 for planned TEER.   Patient's cardiac history dates back to 1996 when he first presented with symptomatic angina pectoris and underwent coronary artery bypass grafting.  He did well for many years but required redo coronary artery  bypass grafting in 2006.  Both procedures were performed by [name]. The patient again did quite well for several years until 2015 when he developed unstable angina. Catheterization at that time revealed high-grade stenosis of the left circumflex coronary artery which was treated with PCI and stenting.  He had a early stent failure requiring another intervention shortly thereafter. In 2016 he began to have recurrent episodes of GI bleeding.  He was found to have both gastritis as well as bleeding diverticuli.  He developed recurrent lower GI bleeding and ultimately required sigmoid colectomy because of recurrent GI bleeding.  In 2019 he developed recurrent chest pain and dyspnea. Catheterization at that time revealed newly occluded vein graft to the right coronary artery with chronic occlusion of the native right coronary artery. There also remained high-grade ostial stenosis of obtuse marginal branch.  Left internal mammary artery remain patent to the distal left anterior descending coronary artery and saphenous vein graft to a large diagonal branch also remain patent.  Attempts at PCI and stenting of left circumflex were unsuccessful.  The patient remained stable until the past year when he began to experience worsening symptoms of congestive heart failure. Echocardiogram August 2020 revealed moderate left ventricular systolic dysfunction with ejection fraction estimated 40 to 45%.  There was akinesis of the mid and apical inferior walls as well as hypokinesis of the inferolateral walls, all consistent with previous infarction in the right coronary artery and/or left circumflex territory. There was moderately reduced right ventricular function and at least moderate mitral regurgitation. Symptoms persisted despite optimal medical therapy and the patient  was eventually referred to the advanced heart failure clinic in January of this year where he was evaluated by Dr. Haroldine Laws. TEE was performed January 06, 2020 and revealed moderate left ventricular systolic dysfunction with ejection fraction estimated 50 to 55% in the setting of severe mitral regurgitation  There was hypokinesis of the inferior wall. There was severe mitral regurgitation with some diastolic flow reversal in the pulmonary veins. There was severe left atrial enlargement.  Right ventricular size appeared normal. There was mild hypokinesis of the right ventricle. Right atrium was severely enlarged.  Right heart catheterization was performed demonstrating moderately elevated biventricular pressures with low cardiac output and prominent V waves on pulmonary capillary wedge tracing consistent with severe mitral regurgitation.   The patient has been evaluated by the multidisciplinary valve team and felt to have severe, symptomatic mitral regurgiation and to be a suitable candidate for TEER, which was set up for 02/06/20.   Hospital Course     Consultants: none   Severe non rheumatic mitral regurgitation: the patient underwent successful transcatheter edge-to-edge mitral valve repair using a single MitraClip NTW device placed A2/P2, reducing mitral regurgitation from 4+ to 1+. Patient will resume his chronic aspirin and Eliquis. Groin site healing well. Post op echo completed but pending formal read. Per Dr. Antionette Char review, excellent reduction of MR with trivial to mild residual MR. I will see in the office next week for close follow up.  Acute on chronic combined S/D CHF: as evidenced by an elevated BNP on pre admission lab work and mild pulmonary vascular congestion on CXR. This has been treated with edge to edge mitral valve repair. IV diuresis was avoided in the seting of significant CKD IV. Will resume home diuretic regimen and GDMT.   Chronic afib: resumed on home Eliquis tonight. Rate well controlled  CKD stage IV: creat has remained stable ~2.3    _____________  Discharge Vitals Blood pressure 130/65, pulse 73, temperature 98.4 F  (36.9 C), temperature source Oral, resp. rate 19, height 5\' 7"  (1.702 m), weight 85.3 kg, SpO2 95 %.  Filed Weights   02/06/20 0616  Weight: 85.3 kg    Labs & Radiologic Studies    CBC Recent Labs    02/07/20 0212  WBC 7.5  HGB 11.8*  HCT 37.4*  MCV 97.7  PLT 892*   Basic Metabolic Panel Recent Labs    02/07/20 0212  NA 139  K 4.1  CL 101  CO2 28  GLUCOSE 175*  BUN 31*  CREATININE 2.30*  CALCIUM 9.5   Liver Function Tests No results for input(s): AST, ALT, ALKPHOS, BILITOT, PROT, ALBUMIN in the last 72 hours. No results for input(s): LIPASE, AMYLASE in the last 72 hours. Cardiac Enzymes No results for input(s): CKTOTAL, CKMB, CKMBINDEX, TROPONINI in the last 72 hours. BNP Invalid input(s): POCBNP D-Dimer No results for input(s): DDIMER in the last 72 hours. Hemoglobin A1C No results for input(s): HGBA1C in the last 72 hours. Fasting Lipid Panel No results for input(s): CHOL, HDL, LDLCALC, TRIG, CHOLHDL, LDLDIRECT in the last 72 hours. Thyroid Function Tests No results for input(s): TSH, T4TOTAL, T3FREE, THYROIDAB in the last 72 hours.  Invalid input(s): FREET3 _____________  DG Chest 2 View  Result Date: 02/03/2020 CLINICAL DATA:  Preoperative evaluation for MVR; history atrial fibrillation/flutter, coronary artery disease post NSTEMI at, CHF, stage III chronic kidney disease, diabetes mellitus, hypertension EXAM: CHEST - 2 VIEW COMPARISON:  01/20/2020 FINDINGS: Enlargement of cardiac silhouette post CABG and coronary stenting.  Slight pulmonary vascular congestion. Mediastinal contours normal with atherosclerotic calcifications aorta. Chronic bronchitic changes without pulmonary infiltrate, pleural effusion or pneumothorax. Bones demineralized with scattered degenerative disc disease changes of the thoracic spine and LEFT glenohumeral degenerative changes. IMPRESSION: Enlargement of cardiac silhouette post CABG and coronary stenting with slight pulmonary vascular  congestion. Chronic bronchitic changes. Electronically Signed   By: Lavonia Dana M.D.   On: 02/03/2020 13:21   DG Chest 2 View  Result Date: 01/20/2020 CLINICAL DATA:  Pre-op for mitral valve repair. EXAM: CHEST - 2 VIEW COMPARISON:  Radiographs 01/03/2016. FINDINGS: The heart size and mediastinal contours are stable status post median sternotomy and CABG. There is aortic atherosclerosis. The lungs are clear. There is no pleural effusion or pneumothorax. Postsurgical changes are present within the right neck. IMPRESSION: Stable postoperative chest. No acute cardiopulmonary process. Electronically Signed   By: Richardean Sale M.D.   On: 01/20/2020 16:22   CARDIAC CATHETERIZATION  Result Date: 02/06/2020 Successful transcatheter edge-to-edge mitral valve repair using a single MitraClip NTW device placed A2/P2, reducing mitral regurgitation from 4+ to 1+  ECHO TEE  Result Date: 02/06/2020    TRANSESOPHOGEAL ECHO REPORT   Patient Name:   JONAN SEUFERT Date of Exam: 02/06/2020 Medical Rec #:  [id number]      Height:       67.0 in Accession #:    [id number]     Weight:       188.0 lb Date of Birth:  09-12-1932      BSA:          1.970 m Patient Age:    68 years       BP:           142/56 mmHg Patient Gender: M              HR:           54 bpm. Exam Location:  Inpatient Procedure: Transesophageal Echo, Cardiac Doppler, Color Doppler and 3D Echo Indications:     Mitral valve insufficiency 424.0 / 134.0  History:         Patient has prior history of Echocardiogram examinations, most                  recent 01/10/2020. CHF, Acute MI and CAD, Prior CABG, Pulmonary                  HTN, Mitral Valve Disease, Arrythmias:RBBB,                  Signs/Symptoms:Murmur; Risk Factors:Sleep Apnea, Hypertension,                  Dyslipidemia, Diabetes and Former Smoker. Carotid artery                  disease.                   Mitral Valve: MitraClip NTW valve is present in the mitral                  position. Procedure Date:  02/06/2020.  Sonographer:     Darlina Sicilian RDCS Referring Phys:  [place] Diagnosing Phys: Ena Dawley MD PROCEDURE: The transesophogeal probe was passed without difficulty through the esophogus of the patient. Sedation performed by different physician. The patient's vital signs; including heart rate, blood pressure, and oxygen saturation; remained stable throughout the procedure. The patient developed no complications during the procedure. IMPRESSIONS  1. Primary mitral regurgitation with degenerative leaflets and 4+ MR, with 1 major jet directed centro-posteriorly. One NTW MitraClip was placed in between A1/2 and P1/2 with improvement of mitral regurgitation to mild to moderate. Mean transmitral gradient increased minimally from 2-> 3 mmHg. There was residual iatrogenic ASD at the fossa ovalis with left to right flow only. LVEF has decreased to approximately 40%.  2. Left ventricular ejection fraction, by estimation, is 45 to 50%. The left ventricle has mildly decreased function. The left ventricle has no regional wall motion abnormalities.  3. Right ventricular systolic function is mildly reduced. The right ventricular size is normal.  4. Left atrial size was severely dilated. No left atrial/left atrial appendage thrombus was detected.  5. Right atrial size was moderately dilated.  6. The mitral valve is degenerative. Severe mitral valve regurgitation. No evidence of mitral stenosis. The mean mitral valve gradient is 2.0 mmHg. There is a MitraClip NTW present in the mitral position. Procedure Date: 02/06/2020.  7. Tricuspid valve regurgitation is moderate.  8. The aortic valve is normal in structure. Aortic valve regurgitation is mild. No aortic stenosis is present.  9. The inferior vena cava is normal in size with greater than 50% respiratory variability, suggesting right atrial pressure of 3 mmHg. Conclusion(s)/Recommendation(s): Normal biventricular function without evidence of hemodynamically  significant valvular heart disease. FINDINGS  Left Ventricle: Left ventricular ejection fraction, by estimation, is 45 to 50%. The left ventricle has mildly decreased function. The left ventricle has no regional wall motion abnormalities. The left ventricular internal cavity size was normal in size. There is no left ventricular hypertrophy. Right Ventricle: The right ventricular size is normal. No increase in right ventricular wall thickness. Right ventricular systolic function is mildly reduced. Left Atrium: Left atrial size was severely dilated. No left atrial/left atrial appendage thrombus was detected. Right Atrium: Right atrial size was moderately dilated. Pericardium: There is no evidence of pericardial effusion. Mitral Valve: The mitral valve is degenerative in appearance. There is moderate thickening of the mitral valve leaflet(s). There is moderate calcification of the mitral valve leaflet(s). Normal mobility of the mitral valve leaflets. Mild mitral annular calcification. Severe mitral valve regurgitation. There is a MitraClip NTW present in the mitral position. Procedure Date: 02/06/2020. No evidence of mitral valve stenosis. MV peak gradient, 6.2 mmHg. The mean mitral valve gradient is 2.0 mmHg. Tricuspid Valve: The tricuspid valve is normal in structure. Tricuspid valve regurgitation is moderate . No evidence of tricuspid stenosis. Aortic Valve: The aortic valve is normal in structure.. There is mild thickening and mild calcification of the aortic valve. Aortic valve regurgitation is mild. No aortic stenosis is present. There is mild thickening of the aortic valve. There is mild calcification of the aortic valve. Pulmonic Valve: The pulmonic valve was normal in structure. Pulmonic valve regurgitation is not visualized. No evidence of pulmonic stenosis. Aorta: The aortic root is normal in size and structure. Venous: The inferior vena cava is normal in size with greater than 50% respiratory variability,  suggesting right atrial pressure of 3 mmHg. IAS/Shunts: No atrial level shunt detected by color flow Doppler.  MITRAL VALVE MV Area (PHT): 2.75 cm MV Peak grad:  6.2 mmHg MV Mean grad:  2.0 mmHg MV Vmax:       1.24 m/s MV Vmean:      60.5 cm/s MR Peak grad:    124.1 mmHg MR Mean grad:    80.0 mmHg MR Vmax:         557.00  cm/s MR Vmean:        422.5 cm/s MR PISA:         3.08 cm MR PISA Eff ROA: 20 mm MR PISA Radius:  0.70 cm Ena Dawley MD Electronically signed by Ena Dawley MD Signature Date/Time: 02/06/2020/2:04:47 PM    Final    Disposition   Pt is being discharged home today in good condition.  Follow-up Plans & Appointments    Follow-up Information    Eileen Stanford, PA-C. Go on 02/13/2020.   Specialties: Cardiology, Radiology Why: @ 3:30pm, please arrive at least 10 minutes early.  Contact information: [address] [place] [address] [phone]            Discharge Medications   Allergies as of 02/07/2020   No Known Allergies     Medication List    TAKE these medications   aspirin 81 MG tablet Take 81 mg by mouth daily.   BD Insulin Syringe U/F 31G X 5/16" 0.3 ML Misc Generic drug: Insulin Syringe-Needle U-100 USE DAILY AS INSTRUCTED   cetirizine 10 MG tablet Commonly known as: ZYRTEC Take 10 mg by mouth daily as needed for allergies.   colchicine 0.6 MG tablet Take 1 tablet (0.6 mg total) by mouth daily as needed (Fri, Sat, Sun, Mon). What changed: when to take this   Eliquis 2.5 MG Tabs tablet Generic drug: apixaban TAKE 1 TABLET TWICE A DAY What changed: how much to take   fish oil-omega-3 fatty acids 1000 MG capsule Take 1 g by mouth daily.   FREESTYLE LITE test strip Generic drug: glucose blood USE TO TEST BLOOD SUGAR ONCE DAILY AND AS NEEDED FOR DIABETES MELLITUS   furosemide 80 MG tablet Commonly known as: LASIX Take 1 tablet (80 mg total) by mouth 2 (two) times daily.   hydrALAZINE 25 MG tablet Commonly  known as: APRESOLINE Take 1 tablet (25 mg total) by mouth 2 (two) times daily.   isosorbide mononitrate 60 MG 24 hr tablet Commonly known as: IMDUR TAKE ONE AND ONE-HALF TABLETS (90 MG) IN THE MORNING AND 1 TABLET (60 MG) IN THE EVENING  (NEW INSTRUCTIONS) What changed:   how much to take  how to take this  when to take this  additional instructions   K-99 595 MG Caps Generic drug: Potassium Gluconate Take 595 mg by mouth daily.   Lantus 100 UNIT/ML injection Generic drug: insulin glargine INJECT 20 TO 25 UNITS AT BEDTIME What changed: See the new instructions.   magnesium oxide 400 MG tablet Commonly known as: MAG-OX Take 400 mg by mouth daily.   metoprolol tartrate 50 MG tablet Commonly known as: LOPRESSOR TAKE 1 TABLET TWICE A DAY   multivitamin with minerals Tabs tablet Take 1 tablet by mouth daily.   nitroGLYCERIN 0.4 MG SL tablet Commonly known as: NITROSTAT Place 1 tablet (0.4 mg total) under the tongue every 5 (five) minutes as needed for chest pain.   ranolazine 500 MG 12 hr tablet Commonly known as: RANEXA TAKE 1 TABLET TWICE A DAY   rosuvastatin 40 MG tablet Commonly known as: CRESTOR Take 1 tablet (40 mg total) by mouth at bedtime.   Vitamin D 1000 units capsule Take 1,000 Units by mouth daily.        Outstanding Labs/Studies   none  Duration of Discharge Encounter   Greater than 30 minutes including physician time.  SignedAngelena Form, PA-C 02/07/2020, 10:20 AM [phone]  Patient seen, examined. Available data reviewed.  Agree with findings, assessment, and plan as outlined by Nell Range, PA-C.  The patient is independently interviewed and examined.  He is alert, oriented, in no distress.  Lungs are clear, heart is irregular with no murmur, abdomen is soft and nontender, extremities have no edema, right groin site is clear.  The patient's 2D echocardiogram is being done at the time of my evaluation.  On my review of the  images, there is only trivial residual mitral regurgitation in the mean mitral transvalvular gradient is 4 to 5 mmHg.  The patient has done very well with MitraClip and will be discharged home later today.  Agree with medication regimen as outlined above.  Sherren Mocha, M.D. 02/07/2020 1:04 PM

## 2020-02-12 LAB — ECHOCARDIOGRAM COMPLETE
Height: 67 in
Weight: 3008 oz

## 2020-02-13 ENCOUNTER — Other Ambulatory Visit: Payer: Self-pay

## 2020-02-13 ENCOUNTER — Ambulatory Visit (INDEPENDENT_AMBULATORY_CARE_PROVIDER_SITE_OTHER): Payer: Medicare Other | Admitting: Physician Assistant

## 2020-02-13 ENCOUNTER — Encounter: Payer: Self-pay | Admitting: Physician Assistant

## 2020-02-13 VITALS — BP 112/62 | HR 78 | Ht 67.0 in | Wt 188.6 lb

## 2020-02-13 DIAGNOSIS — I5042 Chronic combined systolic (congestive) and diastolic (congestive) heart failure: Secondary | ICD-10-CM

## 2020-02-13 DIAGNOSIS — Z9889 Other specified postprocedural states: Secondary | ICD-10-CM

## 2020-02-13 DIAGNOSIS — N184 Chronic kidney disease, stage 4 (severe): Secondary | ICD-10-CM | POA: Diagnosis not present

## 2020-02-13 DIAGNOSIS — Z951 Presence of aortocoronary bypass graft: Secondary | ICD-10-CM | POA: Diagnosis not present

## 2020-02-13 MED ORDER — AMOXICILLIN 500 MG PO CAPS: ORAL_CAPSULE | ORAL | 2 refills | Status: DC

## 2020-02-13 NOTE — Patient Instructions (Signed)
Medication Instructions:  1.) amoxicillin 2000 mg (4 tablets) --take 60 min prior to any dental procedures.  *If you need a refill on your cardiac medications before your next appointment, please call your pharmacy*   Lab Work: none If you have labs (blood work) drawn today and your tests are completely normal, you will receive your results only by: [name] MyChart Message (if you have MyChart) OR . A paper copy in the mail If you have any lab test that is abnormal or we need to change your treatment, we will call you to review the results.   Testing/Procedures: none   Follow-Up: As scheduled  Other Instructions

## 2020-02-13 NOTE — Progress Notes (Signed)
[facility]                                       Cardiology Office Note    Date:  02/13/2020   ID:  WADSWORTH SKOLNICK, DOB 1932-01-03, MRN [id number]  PCP:  Tonia Ghent, MD  Cardiologist: Quay Burow, MD / Dr. Haroldine Laws  CC: TOC s/p MitraClip  History of Present Illness:  SHERRIL HEYWARD is a 86 y.o. male with a history of HTN, CAD s/p CABG, GI bleeding s/p sigmoid colectomy, chronic afib on Eliquis, CKD stage IV, DMT2, OSA, chronic combined S/D CHF and severe MR s/p MitraClip (02/06/20) who presents to clinic for follow up.   Patient's cardiac history dates back to 1996 when he presented with angina and ultimately underwent CABG by [name]. He did well for many years but required redo CABG in 2006. In 2015 he developed unstable angina. Catheterization at that time revealed high-grade stenosis in the LCx which was treated with a PCI and stenting. This was complicated by early stent failure requiring another intervention shortly after.  In 2016 he began to have recurrent episodes of GI bleeding. He was found to have both gastritis as well as bleeding diverticuli. He developed recurrent lower GI bleeding and ultimately required sigmoid colectomy because of recurrent GI bleeding. In 2019 he developed recurrent chest pain and dyspnea. Catheterization at that time revealed newly occluded vein graft to the right coronary artery with chronic occlusion of the native right coronary artery.There also remained high-grade ostial stenosis of obtuse marginal branch.Left internal mammary artery remain patent to the distal left anterior descending coronary artery and saphenous vein graft to a large diagonal branch also remain patent. Attempts at PCI and stenting of left circumflex were unsuccessful.  The patient remained stable until the past year when he began to experience worsening symptoms of congestive heart failure. Echocardiogram 06/2019  revealed moderate left ventricular systolic dysfunction with ejection fraction estimated 40 to 45%. There was akinesis of the mid and apical inferior walls as well as hypokinesis of the inferolateral walls, all consistent with previous infarction in the right coronary artery and/or left circumflex territory. There was moderately reduced right ventricular function and at least moderate mitral regurgitation. Symptoms persisted despite optimal medical therapy and the patient was eventually referred to the advanced heart failure clinic in January of this year where he was evaluated by Dr. Haroldine Laws. TEE 12/2020 revealed moderate left ventricular systolic dysfunction with EF 50 to 55% in the setting of severe mitral regurgitation. Right heart catheterization was performed demonstrating moderately elevated biventricular pressures with low cardiac output and prominent V waves on pulmonary capillary wedge tracing consistent with severe mitral regurgitation.   The patient was evaluated by the multidisciplinary valve team and underwent successful transcatheter edge-to-edge mitral valve repair using a single MitraClip NTW device placed A2/P2, reducing mitral regurgitation from 4+ to 1+ on 02/06/20. Post op echo showed EF 45-50%, normally functioning MitraClip with mild residual mitral regurgitation, mean gradient 4 mmHg. He was discharged on home aspirin and Eliquis.   Today he presents to clinic for follow up. Has some mild LE edema today but says he did not take his usual second dose of lasix today given his appointment. No CP or SOB. No orthopnea or PND. No dizziness or syncope. No blood in stool or urine. Occasionally gets palpitations.  He could tell a difference in his breathing right away after MitraClip. He still has some fatigue and decreased stamina that he is working to improve. He has been walking laps.   Past Medical History:  Diagnosis Date  . Age-related macular degeneration, wet, both eyes ([place])   .  Anemia    Secondary to acute blood loss  . Anginal pain ([place])    last chest pain in Feb 2021  . Arthritis    "mild in hands, knees, ankles" (11/13/2015)  . Atrial fibrillation ([place])    Consideration was given for atrial flutter ablation, but patient developed atrial fibrillation. Cardioversion was done. Dr. Caryl Comes decided to watch him clinically. November, 2011  . Atrial flutter [facility]) 07/2010   September, 2011   Hospital with PNA and cath done.Marland KitchenMarland KitchenCoumadin.  Atrial flutter ablation planned, but  pt. then had atrial fibrillation,/outpatient conversion 09/08/10..NSR..plan to follow..Dr. Caryl Comes  . CAD (coronary artery disease)    Catheterization, September, 2011,  grafts patent from redo CABG,, medical therapy of coronary disease, consideration to proceeding with atrial flutter ablation  . Carotid artery disease ([place])    Doppler 09/18/2009 - 49% bilateral stenoses  . Carotid artery disease ([name])    49% bilateral, Doppler, November, 2010  . CHF (congestive heart failure) ([place])   . Chronic kidney disease (CKD), stage III (moderate)   . Diabetic peripheral neuropathy ([name])    feet  . Diverticulosis of colon with hemorrhage 2009   several unit diverticular bleed   . Erectile dysfunction    Mild  . Gout   . Hearing loss    wears hearing aids  . History of blood transfusion "several"   related to diverticular bleeding  . Hyperlipidemia   . Hypertension   . Mitral regurgitation    Mild, echo, September, 2011  . NSTEMI (non-ST elevated myocardial infarction) ([place]) 07/2010   at [facility] with repeat cath, rec medical mgmt   . OSA on CPAP    uses CPAP nightly  . Personal history of colonic polyps   . PNA (pneumonia) 9/11   NSTEMI at [facility] with repeat cath, rec medical mgmt   . RBBB (right bundle branch block)   . S/P mitral valve repair 02/06/2020   s/p TEER with a single MitraClip NTW placed on A2/P2  . Shoulder pain    "positional; better now" (11/13/2015)  . Skin cancer    R lower leg, per derm  2012  . Type II diabetes mellitus ([place])     Past Surgical History:  Procedure Laterality Date  . CARDIAC CATHETERIZATION  2006   Nuclear..slight lateral ischemia..medical therapy  . CARDIAC CATHETERIZATION  08/04/2010   grafts patent from redo CABG...medical Rx and ablate Atrial flutter (LV not injected)   . CARDIOVERSION  ~ 2010  . CAROTID ENDARTERECTOMY Right 1994  . CATARACT EXTRACTION W/ INTRAOCULAR LENS  IMPLANT, BILATERAL Bilateral   . COLON RESECTION N/A 01/13/2016   Procedure: EXPLORATORY LAPAROTOMY, LEFT AND SIGMOID COLON REMOVAL;  Surgeon: Ralene Ok, MD;  Location: [place];  Service: General;  Laterality: N/A;  Extended open left hemicolectomy and sigmoidectomy   . COLONOSCOPY N/A 11/14/2015   Procedure: COLONOSCOPY;  Surgeon: Manus Gunning, MD;  Location: [facility] ENDOSCOPY;  Service: Gastroenterology;  Laterality: N/A;  . COLONOSCOPY Left 01/05/2016   Procedure: COLONOSCOPY;  Surgeon: Manus Gunning, MD;  Location: [place];  Service: Gastroenterology;  Laterality: Left;  no sedation to start, moderate if needed  . COLONOSCOPY W/ POLYPECTOMY    .  CORONARY ARTERY BYPASS GRAFT  1995; 2006   "X 3; X3"  . [place], 2006  . DOPPLER ECHOCARDIOGRAPHY  08/2008   EF 60%  . DOPPLER ECHOCARDIOGRAPHY  08/02/2010   65-70%  . DOPPLER ECHOCARDIOGRAPHY  07/2010   MR mild  . ESOPHAGOGASTRODUODENOSCOPY N/A 06/19/2014   Procedure: ESOPHAGOGASTRODUODENOSCOPY (EGD);  Surgeon: Jerene Bears, MD;  Location: [facility] ENDOSCOPY;  Service: Endoscopy;  Laterality: N/A;  . ESOPHAGOGASTRODUODENOSCOPY N/A 11/14/2015   Procedure: ESOPHAGOGASTRODUODENOSCOPY (EGD);  Surgeon: Manus Gunning, MD;  Location: [place];  Service: Gastroenterology;  Laterality: N/A;  . FLEXIBLE SIGMOIDOSCOPY N/A 11/16/2015   Procedure: FLEXIBLE SIGMOIDOSCOPY;  Surgeon: Manus Gunning, MD;  Location: [place];  Service: Gastroenterology;  Laterality: N/A;  . LAPAROSCOPIC  CHOLECYSTECTOMY  2008  . LEFT HEART CATH N/A 06/14/2014   Procedure: LEFT HEART CATH;  Surgeon: Sinclair Grooms, MD;  Location: [facility] CATH LAB;  Service: Cardiovascular;  Laterality: N/A;  . LEFT HEART CATH AND CORONARY ANGIOGRAPHY N/A 08/02/2018   Procedure: LEFT HEART CATH AND CORONARY ANGIOGRAPHY;  Surgeon: [name], MD;  Location: [place] CV LAB;  Service: Cardiovascular;  Laterality: N/A;  . LEFT HEART CATHETERIZATION WITH CORONARY/GRAFT ANGIOGRAM N/A 06/11/2014   Procedure: LEFT HEART CATHETERIZATION WITH Beatrix Fetters;  Surgeon: Sinclair Grooms, MD;  Location: [facility] CATH LAB;  Service: Cardiovascular;  Laterality: N/A;  . MITRAL VALVE REPAIR N/A 02/06/2020   Procedure: MITRAL VALVE REPAIR;  Surgeon: Sherren Mocha, MD;  Location: [place] CV LAB;  Service: Cardiovascular;  Laterality: N/A;  . PERCUTANEOUS CORONARY STENT INTERVENTION (PCI-S) N/A 06/13/2014   Procedure: PERCUTANEOUS CORONARY STENT INTERVENTION (PCI-S);  Surgeon: Sinclair Grooms, MD;  Location: [facility] CATH LAB;  Service: Cardiovascular;  Laterality: N/A;  . RIGHT HEART CATH N/A 01/06/2020   Procedure: RIGHT HEART CATH;  Surgeon: Jolaine Artist, MD;  Location: [place] CV LAB;  Service: Cardiovascular;  Laterality: N/A;  . SKIN CANCER EXCISION Left 10/2015   calf  . SKIN CANCER EXCISION Right 2014?   chest  . TEE WITHOUT CARDIOVERSION N/A 01/06/2020   Procedure: TRANSESOPHAGEAL ECHOCARDIOGRAM (TEE);  Surgeon: Jolaine Artist, MD;  Location: [facility] ENDOSCOPY;  Service: Cardiovascular;  Laterality: N/A;  . TONSILLECTOMY    . VASECTOMY      Current Medications: Outpatient Medications Prior to Visit  Medication Sig Dispense Refill  . aspirin 81 MG tablet Take 81 mg by mouth daily.    . BD INSULIN SYRINGE ULTRAFINE 31G X 5/16" 0.3 ML MISC USE DAILY AS INSTRUCTED 100 each 2  . cetirizine (ZYRTEC) 10 MG tablet Take 10 mg by mouth daily as needed for allergies.    . Cholecalciferol (VITAMIN D) 1000  UNITS capsule Take 1,000 Units by mouth daily.     . colchicine 0.6 MG tablet Take 1 tablet (0.6 mg total) by mouth daily as needed (Fri, Sat, Sun, Mon).    [name] ELIQUIS 2.5 MG TABS tablet TAKE 1 TABLET TWICE A DAY 180 tablet 3  . fish oil-omega-3 fatty acids 1000 MG capsule Take 1 g by mouth daily.     . furosemide (LASIX) 80 MG tablet Take 1 tablet (80 mg total) by mouth 2 (two) times daily. 60 tablet 5  . glucose blood (FREESTYLE LITE) test strip USE TO TEST BLOOD SUGAR ONCE DAILY AND AS NEEDED FOR DIABETES MELLITUS 300 each 3  . hydrALAZINE (APRESOLINE) 25 MG tablet Take 1 tablet (25 mg total) by mouth 2 (  two) times daily. 60 tablet 3  . isosorbide mononitrate (IMDUR) 60 MG 24 hr tablet TAKE ONE AND ONE-HALF TABLETS (90 MG) IN THE MORNING AND 1 TABLET (60 MG) IN THE EVENING  (NEW INSTRUCTIONS) 270 tablet 3  . LANTUS 100 UNIT/ML injection INJECT 20 TO 25 UNITS AT BEDTIME 40 mL 3  . magnesium oxide (MAG-OX) 400 MG tablet Take 400 mg by mouth daily.     . metoprolol tartrate (LOPRESSOR) 50 MG tablet TAKE 1 TABLET TWICE A DAY 180 tablet 1  . Multiple Vitamin (MULTIVITAMIN WITH MINERALS) TABS Take 1 tablet by mouth daily.    . nitroGLYCERIN (NITROSTAT) 0.4 MG SL tablet Place 1 tablet (0.4 mg total) under the tongue every 5 (five) minutes as needed for chest pain. 100 tablet 1  . Potassium Gluconate (K-99) 595 MG CAPS Take 595 mg by mouth daily.     . ranolazine (RANEXA) 500 MG 12 hr tablet TAKE 1 TABLET TWICE A DAY 180 tablet 2  . rosuvastatin (CRESTOR) 40 MG tablet Take 1 tablet (40 mg total) by mouth at bedtime. 90 tablet 3   No facility-administered medications prior to visit.     Allergies:   Patient has no known allergies.   Social History   Socioeconomic History  . Marital status: Widowed    Spouse name: Not on file  . Number of children: 2  . Years of education: Not on file  . Highest education level: Not on file  Occupational History  . Occupation: Retired Teacher, adult education. Rep. Restaurant manager, fast food: RETIRED  Tobacco Use  . Smoking status: Former Smoker    Packs/day: 1.00    Years: 8.00    Pack years: 8.00    Types: Cigarettes    Quit date: 1958    Years since quitting: 63.2  . Smokeless tobacco: Never Used  . Tobacco comment: "quit smoking cigarettes in 1958"  Substance and Sexual Activity  . Alcohol use: No    Alcohol/week: 0.0 standard drinks  . Drug use: No  . Sexual activity: Yes  Other Topics Concern  . Not on file  Social History Narrative   From [place].  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     Lives with girlfriend [name].  Widowed 12/2005.   Social Determinants of Health   Financial Resource Strain: Low Risk   . Difficulty of Paying Living Expenses: Not hard at all  Food Insecurity: No Food Insecurity  . Worried About Charity fundraiser in the Last Year: Never true  . Ran Out of Food in the Last Year: Never true  Transportation Needs: No Transportation Needs  . Lack of Transportation (Medical): No  . Lack of Transportation (Non-Medical): No  Physical Activity: Inactive  . Days of Exercise per Week: 0 days  . Minutes of Exercise per Session: 0 min  Stress: No Stress Concern Present  . Feeling of Stress : Only a little  Social Connections:   . Frequency of Communication with Friends and Family:   . Frequency of Social Gatherings with Friends and Family:   . Attends Religious Services:   . Active Member of Clubs or Organizations:   . Attends Archivist Meetings:   [name] Marital Status:      Family History:  The patient's family history includes Arthritis in his sister; Cancer in his sister; Diabetes in his mother and sister; Heart disease in his father and sister; Kidney disease in  his mother; Stroke in his mother.     ROS:   Please see the history of present illness.    ROS All other systems reviewed and are negative.   PHYSICAL EXAM:   VS:  BP 112/62   Pulse 78   Ht 5\' 7"  (1.702 m)   Wt  188 lb 9.6 oz (85.5 kg)   SpO2 90%   BMI 29.54 kg/m    GEN: Well nourished, well developed, in no acute distress HEENT: normal Neck: no JVD or masses Cardiac: irreg irreg; no murmurs, rubs, or gallops. 1 + LE edema bilaterally.  Respiratory:  clear to auscultation bilaterally, normal work of breathing GI: soft, nontender, nondistended, + BS MS: no deformity or atrophy Skin: warm and dry, no rash.  Neuro:  Alert and Oriented x 3, Strength and sensation are intact Psych: euthymic mood, full affect   Wt Readings from Last 3 Encounters:  02/13/20 188 lb 9.6 oz (85.5 kg)  02/06/20 188 lb (85.3 kg)  02/03/20 192 lb 2 oz (87.1 kg)      Studies/Labs Reviewed:   EKG:  EKG is NOT ordered today.   Recent Labs: 02/03/2020: ALT 22; B Natriuretic Peptide 437.4 02/07/2020: BUN 31; Creatinine, Ser 2.30; Hemoglobin 11.8; Platelets 114; Potassium 4.1; Sodium 139   Lipid Panel    Component Value Date/Time   CHOL 102 06/21/2019 1137   TRIG 106.0 06/21/2019 1137   HDL 25.20 (L) 06/21/2019 1137   CHOLHDL 4 06/21/2019 1137   VLDL 21.2 06/21/2019 1137   LDLCALC 56 06/21/2019 1137    Additional studies/ records that were reviewed today include:   01/17/2020 MITRAL VALVE REPAIR  Conclusion Successful transcatheter edge-to-edge mitral valve repair using a single MitraClip NTW device placed A2/P2, reducing mitral regurgitation from 4+ to 1+    _______________   ECHO 02/07/20 IMPRESSIONS 1. Day 1 post NTW MitraClip placement, mild residual mitral  regurgitation, mean gradient 4 mmHg.  2. Left ventricular ejection fraction, by estimation, is 45 to 50%. The  left ventricle has mildly decreased function. The left ventricle has no  regional wall motion abnormalities. There is moderate left ventricular  hypertrophy. Left ventricular  diastolic parameters are consistent with Grade II diastolic dysfunction  (pseudonormalization). Elevated left ventricular end-diastolic pressure.  There is the  interventricular septum is flattened in systole and diastole,  consistent with right ventricular  pressure and volume overload.  3. Right ventricular systolic function is normal. The right ventricular  size is normal. There is moderately elevated pulmonary artery systolic  pressure. The estimated right ventricular systolic pressure is 62.5 mmHg.  4. Left atrial size was moderately dilated.  5. Right atrial size was moderately dilated.  6. Day 1 post NTW MitraClip placement, mild residual mitral  regurgitation, mean gradient 4 mmHg.. The mitral valve is normal in  structure. Mild mitral valve regurgitation. No evidence of mitral  stenosis. The mean mitral valve gradient is 4.5 mmHg. There  is a present in the mitral position.  7. Tricuspid valve regurgitation is moderate.  8. The aortic valve is normal in structure. Aortic valve regurgitation is  mild. No aortic stenosis is present.  9. The inferior vena cava is normal in size with greater than 50%  respiratory variability, suggesting right atrial pressure of 3 mmHg.    ASSESSMENT & PLAN:   Severe non rheumatic MR s/p TEER: doing excellent. He can tell a big difference in his breathing since MitraClip. Groin site healing well. SBE prophylaxis discussed; I have WL'S  amoxicillin. He will continued on renally dosed Eliquis and aspirin. I will see him back later this month for follow up and echo.   Chronic combined S/D CHF: appears euvolemic today. Continue on hydralazine/nitrate combo, Lopressor and Lasix 80mg  BID.   Chronic afib: rate well controlled today. Continue on Eliquis 2.5mg  BID.   CAD s/p CABG/redo CABG/PCI: no chest pain.Continue Ranexa. Continue on medical therapy with aspirin, statin and BB.   CKD stage IV: creat has remained stable ~2.3   Medication Adjustments/Labs and Tests Ordered: Current medicines are reviewed at length with the patient today.  Concerns regarding medicines are outlined above.  Medication  changes, Labs and Tests ordered today are listed in the Patient Instructions below. Patient Instructions  Medication Instructions:  1.) amoxicillin 2000 mg (4 tablets) --take 60 min prior to any dental procedures.  *If you need a refill on your cardiac medications before your next appointment, please call your pharmacy*   Lab Work: none If you have labs (blood work) drawn today and your tests are completely normal, you will receive your results only by: [name] MyChart Message (if you have MyChart) OR . A paper copy in the mail If you have any lab test that is abnormal or we need to change your treatment, we will call you to review the results.   Testing/Procedures: none   Follow-Up: As scheduled  Other Instructions      Signed, Angelena Form, PA-C  02/13/2020 4:00 PM    [facility] [place], [place], [place]  [address] Phone: [phone]; Fax: [phone]

## 2020-02-21 ENCOUNTER — Other Ambulatory Visit: Payer: Self-pay | Admitting: Cardiovascular Disease

## 2020-03-03 NOTE — Progress Notes (Deleted)
[facility]                                       Cardiology Office Note    Date:  03/03/2020   ID:  [name] [birth year], MRN [id number]  PCP:  Jeremiah Ghent, MD  Cardiologist: Jeremiah Burow, MD / [name]  CC: 1 month s/p MitraClip  History of Present Illness:  [name] is a 86 y.o. male with a history of HTN, CAD s/p CABG, GI bleeding s/p sigmoid colectomy, chronic afib on Eliquis, CKD stage IV, DMT2, OSA, chronic combined S/D CHF and severe MR s/p MitraClip (02/06/20) who presents to clinic for follow up.   Patient's cardiac history dates back to 1996 when he presented with angina and ultimately underwent CABG by [name]. He did well for many years but required redo CABG in 2006. In 2015 he developed unstable angina. Catheterization at that time revealed high-grade stenosis in the LCx which was treated with a PCI and stenting. This was complicated by early stent failure requiring another intervention shortly after.  In 2016 he began to have recurrent episodes of GI bleeding.He was found to have both gastritis as well as bleeding diverticuli. He developed recurrent lower GI bleeding and ultimately required sigmoid colectomy because of recurrent GI bleeding. In 2019 he developed recurrent chest pain and dyspnea. Catheterization at that time revealed newly occluded vein graft to the right coronary artery with chronic occlusion of the native right coronary artery.There also remained high-grade ostial stenosis of obtuse marginal branch.Left internal mammary artery remain patent to the distal left anterior descending coronary artery and saphenous vein graft to a large diagonal branch also remain patent. Attempts at PCI and stenting of left circumflex were unsuccessful.  The patient remained stable until the past year when he began to experience worsening symptoms of congestive heart failure. Echocardiogram  06/2019 revealed moderate left ventricular systolic dysfunction with ejection fraction estimated 40 to 45%. There was akinesis of the mid and apical inferior walls as well as hypokinesis of the inferolateral walls, all consistent with previous infarction in the right coronary artery and/or left circumflex territory. There was moderately reduced right ventricular function and at least moderate mitral regurgitation. Symptoms persisted despite optimal medical therapy and the patient was eventually referred to the advanced heart failure clinic in January of this year where he was evaluated by [name]. TEE 12/2020 revealed moderate left ventricular systolic dysfunction with EF 50 to 55% in the setting of severe mitral regurgitation. Right heart catheterization was performed demonstrating moderately elevated biventricular pressures with low cardiac output and prominent V waves on pulmonary capillary wedge tracing consistent with severe mitral regurgitation.   The patient was evaluated by the multidisciplinary valve team and underwent successful transcatheter edge-to-edge mitral valve repair using a single MitraClip NTW device placed A2/P2, reducing mitral regurgitation from 4+ to 1+ on 02/06/20. Post op echo showed EF 45-50%, normally functioning MitraClip with mild residual mitral regurgitation, mean gradient 4 mmHg. He was discharged on home aspirin and Eliquis.   Today he presents to clinic for follow up.   Past Medical History:  Diagnosis Date  . Age-related macular degeneration, wet, both eyes ([place])   . Anemia    Secondary to acute blood loss  . Anginal pain ([place])    last chest pain in Feb 2021  .  Arthritis    "mild in hands, knees, ankles" (11/13/2015)  . Atrial fibrillation ([place])    Consideration was given for atrial flutter ablation, but patient developed atrial fibrillation. Cardioversion was done. Dr. Caryl Comes decided to watch him clinically. November, 2011  . Atrial flutter [facility]) 07/2010    September, 2011   Hospital with PNA and cath done.Marland KitchenMarland KitchenCoumadin.  Atrial flutter ablation planned, but  pt. then had atrial fibrillation,/outpatient conversion 09/08/10..NSR..plan to follow..Dr. Caryl Comes  . CAD (coronary artery disease)    Catheterization, September, 2011,  grafts patent from redo CABG,, medical therapy of coronary disease, consideration to proceeding with atrial flutter ablation  . Carotid artery disease ([place])    Doppler 09/18/2009 - 49% bilateral stenoses  . Carotid artery disease ([name])    49% bilateral, Doppler, November, 2010  . CHF (congestive heart failure) ([place])   . Chronic kidney disease (CKD), stage III (moderate)   . Diabetic peripheral neuropathy ([name])    feet  . Diverticulosis of colon with hemorrhage 2009   several unit diverticular bleed   . Erectile dysfunction    Mild  . Gout   . Hearing loss    wears hearing aids  . History of blood transfusion "several"   related to diverticular bleeding  . Hyperlipidemia   . Hypertension   . Mitral regurgitation    Mild, echo, September, 2011  . NSTEMI (non-ST elevated myocardial infarction) ([place]) 07/2010   at [facility] with repeat cath, rec medical mgmt   . OSA on CPAP    uses CPAP nightly  . Personal history of colonic polyps   . PNA (pneumonia) 9/11   NSTEMI at [facility] with repeat cath, rec medical mgmt   . RBBB (right bundle branch block)   . S/P mitral valve repair 02/06/2020   s/p TEER with a single MitraClip NTW placed on A2/P2  . Shoulder pain    "positional; better now" (11/13/2015)  . Skin cancer    R lower leg, per derm 2012  . Type II diabetes mellitus ([place])     Past Surgical History:  Procedure Laterality Date  . CARDIAC CATHETERIZATION  2006   Nuclear..slight lateral ischemia..medical therapy  . CARDIAC CATHETERIZATION  08/04/2010   grafts patent from redo CABG...medical Rx and ablate Atrial flutter (LV not injected)   . CARDIOVERSION  ~ 2010  . CAROTID ENDARTERECTOMY Right 1994  . CATARACT EXTRACTION W/  INTRAOCULAR LENS  IMPLANT, BILATERAL Bilateral   . COLON RESECTION N/A 01/13/2016   Procedure: EXPLORATORY LAPAROTOMY, LEFT AND SIGMOID COLON REMOVAL;  Surgeon: Ralene Ok, MD;  Location: [place];  Service: General;  Laterality: N/A;  Extended open left hemicolectomy and sigmoidectomy   . COLONOSCOPY N/A 11/14/2015   Procedure: COLONOSCOPY;  Surgeon: Manus Gunning, MD;  Location: [facility] ENDOSCOPY;  Service: Gastroenterology;  Laterality: N/A;  . COLONOSCOPY Left 01/05/2016   Procedure: COLONOSCOPY;  Surgeon: Manus Gunning, MD;  Location: [place];  Service: Gastroenterology;  Laterality: Left;  no sedation to start, moderate if needed  . COLONOSCOPY W/ POLYPECTOMY    . CORONARY ARTERY BYPASS GRAFT  1995; 2006   "X 3; X3"  . [place], 2006  . DOPPLER ECHOCARDIOGRAPHY  08/2008   EF 60%  . DOPPLER ECHOCARDIOGRAPHY  08/02/2010   65-70%  . DOPPLER ECHOCARDIOGRAPHY  07/2010   MR mild  . ESOPHAGOGASTRODUODENOSCOPY N/A 06/19/2014   Procedure: ESOPHAGOGASTRODUODENOSCOPY (EGD);  Surgeon: Jerene Bears, MD;  Location: [facility] ENDOSCOPY;  Service: Endoscopy;  Laterality: N/A;  .  ESOPHAGOGASTRODUODENOSCOPY N/A 11/14/2015   Procedure: ESOPHAGOGASTRODUODENOSCOPY (EGD);  Surgeon: Manus Gunning, MD;  Location: [place];  Service: Gastroenterology;  Laterality: N/A;  . FLEXIBLE SIGMOIDOSCOPY N/A 11/16/2015   Procedure: FLEXIBLE SIGMOIDOSCOPY;  Surgeon: Manus Gunning, MD;  Location: [place];  Service: Gastroenterology;  Laterality: N/A;  . LAPAROSCOPIC CHOLECYSTECTOMY  2008  . LEFT HEART CATH N/A 06/14/2014   Procedure: LEFT HEART CATH;  Surgeon: Sinclair Grooms, MD;  Location: [facility] CATH LAB;  Service: Cardiovascular;  Laterality: N/A;  . LEFT HEART CATH AND CORONARY ANGIOGRAPHY N/A 08/02/2018   Procedure: LEFT HEART CATH AND CORONARY ANGIOGRAPHY;  Surgeon: [name], MD;  Location: [place] CV LAB;  Service: Cardiovascular;  Laterality: N/A;   . LEFT HEART CATHETERIZATION WITH CORONARY/GRAFT ANGIOGRAM N/A 06/11/2014   Procedure: LEFT HEART CATHETERIZATION WITH Beatrix Fetters;  Surgeon: Sinclair Grooms, MD;  Location: [facility] CATH LAB;  Service: Cardiovascular;  Laterality: N/A;  . MITRAL VALVE REPAIR N/A 02/06/2020   Procedure: MITRAL VALVE REPAIR;  Surgeon: Sherren Mocha, MD;  Location: [place] CV LAB;  Service: Cardiovascular;  Laterality: N/A;  . PERCUTANEOUS CORONARY STENT INTERVENTION (PCI-S) N/A 06/13/2014   Procedure: PERCUTANEOUS CORONARY STENT INTERVENTION (PCI-S);  Surgeon: Sinclair Grooms, MD;  Location: [facility] CATH LAB;  Service: Cardiovascular;  Laterality: N/A;  . RIGHT HEART CATH N/A 01/06/2020   Procedure: RIGHT HEART CATH;  Surgeon: Jolaine Artist, MD;  Location: [place] CV LAB;  Service: Cardiovascular;  Laterality: N/A;  . SKIN CANCER EXCISION Left 10/2015   calf  . SKIN CANCER EXCISION Right 2014?   chest  . TEE WITHOUT CARDIOVERSION N/A 01/06/2020   Procedure: TRANSESOPHAGEAL ECHOCARDIOGRAM (TEE);  Surgeon: Jolaine Artist, MD;  Location: [facility] ENDOSCOPY;  Service: Cardiovascular;  Laterality: N/A;  . TONSILLECTOMY    . VASECTOMY      Current Medications: Outpatient Medications Prior to Visit  Medication Sig Dispense Refill  . amoxicillin (AMOXIL) 500 MG capsule Take 4 tablets (2000 mg) by mouth 60 min prior to any dental procedures 8 capsule 2  . aspirin 81 MG tablet Take 81 mg by mouth daily.    . BD INSULIN SYRINGE ULTRAFINE 31G X 5/16" 0.3 ML MISC USE DAILY AS INSTRUCTED 100 each 2  . cetirizine (ZYRTEC) 10 MG tablet Take 10 mg by mouth daily as needed for allergies.    . Cholecalciferol (VITAMIN D) 1000 UNITS capsule Take 1,000 Units by mouth daily.     . colchicine 0.6 MG tablet Take 1 tablet (0.6 mg total) by mouth daily as needed (Fri, Sat, Sun, Mon).    [name] ELIQUIS 2.5 MG TABS tablet TAKE 1 TABLET TWICE A DAY 180 tablet 3  . fish oil-omega-3 fatty acids 1000 MG capsule Take 1 g by  mouth daily.     . furosemide (LASIX) 80 MG tablet Take 1 tablet (80 mg total) by mouth 2 (two) times daily. 60 tablet 5  . glucose blood (FREESTYLE LITE) test strip USE TO TEST BLOOD SUGAR ONCE DAILY AND AS NEEDED FOR DIABETES MELLITUS 300 each 3  . hydrALAZINE (APRESOLINE) 25 MG tablet Take 1 tablet (25 mg total) by mouth 2 (two) times daily. 60 tablet 3  . isosorbide mononitrate (IMDUR) 60 MG 24 hr tablet TAKE ONE AND ONE-HALF TABLETS (90 MG) IN THE MORNING AND 1 TABLET (60 MG) IN THE EVENING  (NEW INSTRUCTIONS) 270 tablet 3  . LANTUS 100 UNIT/ML injection INJECT 20 TO 25 UNITS AT BEDTIME 40  mL 3  . magnesium oxide (MAG-OX) 400 MG tablet Take 400 mg by mouth daily.     . metoprolol tartrate (LOPRESSOR) 50 MG tablet TAKE 1 TABLET TWICE A DAY 180 tablet 1  . Multiple Vitamin (MULTIVITAMIN WITH MINERALS) TABS Take 1 tablet by mouth daily.    . nitroGLYCERIN (NITROSTAT) 0.4 MG SL tablet Place 1 tablet (0.4 mg total) under the tongue every 5 (five) minutes as needed for chest pain. 100 tablet 1  . Potassium Gluconate (K-99) 595 MG CAPS Take 595 mg by mouth daily.     . ranolazine (RANEXA) 500 MG 12 hr tablet TAKE 1 TABLET TWICE A DAY 180 tablet 2  . rosuvastatin (CRESTOR) 40 MG tablet Take 1 tablet (40 mg total) by mouth at bedtime. 90 tablet 3   No facility-administered medications prior to visit.     Allergies:   Patient has no known allergies.   Social History   Socioeconomic History  . Marital status: Widowed    Spouse name: Not on file  . Number of children: 2  . Years of education: Not on file  . Highest education level: Not on file  Occupational History  . 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 Smoking status: Former Smoker    Packs/day: 1.00    Years: 8.00    Pack years: 8.00    Types: Cigarettes    Quit date: 1958    Years since quitting: 63.3  . Smokeless tobacco: Never Used  . Tobacco comment: "quit smoking cigarettes in  1958"  Substance and Sexual Activity  . Alcohol use: No    Alcohol/week: 0.0 standard drinks  . Drug use: No  . Sexual activity: Yes  Other Topics Concern  . Not on file  Social History Narrative   From [place].  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     Lives with girlfriend [name].  Widowed 12/2005.   Social Determinants of Health   Financial Resource Strain: Low Risk   . Difficulty of Paying Living Expenses: Not hard at all  Food Insecurity: No Food Insecurity  . Worried About Charity fundraiser in the Last Year: Never true  . Ran Out of Food in the Last Year: Never true  Transportation Needs: No Transportation Needs  . Lack of Transportation (Medical): No  . Lack of Transportation (Non-Medical): No  Physical Activity: Inactive  . Days of Exercise per Week: 0 days  . Minutes of Exercise per Session: 0 min  Stress: No Stress Concern Present  . Feeling of Stress : Only a little  Social Connections:   . Frequency of Communication with Friends and Family:   . Frequency of Social Gatherings with Friends and Family:   . Attends Religious Services:   . Active Member of Clubs or Organizations:   . Attends Archivist Meetings:   [name] Marital Status:      Family History:  The patient's family history includes Arthritis in his sister; Cancer in his sister; Diabetes in his mother and sister; Heart disease in his father and sister; Kidney disease in his mother; Stroke in his mother.     ROS:   Please see the history of present illness.    ROS All other systems reviewed and are negative.   PHYSICAL EXAM:   VS:  There were no vitals taken for this visit.   GEN: Well nourished, well developed,  in no acute distress HEENT: normal Neck: no JVD or masses Cardiac: irreg irreg; no murmurs, rubs, or gallops.  Respiratory:  clear to auscultation bilaterally, normal work of breathing GI: soft, nontender, nondistended, + BS MS: no deformity or  atrophy Skin: warm and dry, no rash.  Neuro:  Alert and Oriented x 3, Strength and sensation are intact Psych: euthymic mood, full affect   Wt Readings from Last 3 Encounters:  02/13/20 188 lb 9.6 oz (85.5 kg)  02/06/20 188 lb (85.3 kg)  02/03/20 192 lb 2 oz (87.1 kg)      Studies/Labs Reviewed:   EKG:  EKG is NOT ordered today.   Recent Labs: 02/03/2020: ALT 22; B Natriuretic Peptide 437.4 02/07/2020: BUN 31; Creatinine, Ser 2.30; Hemoglobin 11.8; Platelets 114; Potassium 4.1; Sodium 139   Lipid Panel    Component Value Date/Time   CHOL 102 06/21/2019 1137   TRIG 106.0 06/21/2019 1137   HDL 25.20 (L) 06/21/2019 1137   CHOLHDL 4 06/21/2019 1137   VLDL 21.2 06/21/2019 1137   LDLCALC 56 06/21/2019 1137    Additional studies/ records that were reviewed today include:   01/17/2020 MITRAL VALVE REPAIR  Conclusion Successful transcatheter edge-to-edge mitral valve repair using a single MitraClip NTW device placed A2/P2, reducing mitral regurgitation from 4+ to 1+    _______________   ECHO 02/07/20 IMPRESSIONS 1. Day 1 post NTW MitraClip placement, mild residual mitral  regurgitation, mean gradient 4 mmHg.  2. Left ventricular ejection fraction, by estimation, is 45 to 50%. The  left ventricle has mildly decreased function. The left ventricle has no  regional wall motion abnormalities. There is moderate left ventricular  hypertrophy. Left ventricular  diastolic parameters are consistent with Grade II diastolic dysfunction  (pseudonormalization). Elevated left ventricular end-diastolic pressure.  There is the interventricular septum is flattened in systole and diastole,  consistent with right ventricular  pressure and volume overload.  3. Right ventricular systolic function is normal. The right ventricular  size is normal. There is moderately elevated pulmonary artery systolic  pressure. The estimated right ventricular systolic pressure is 44.0 mmHg.  4. Left atrial  size was moderately dilated.  5. Right atrial size was moderately dilated.  6. Day 1 post NTW MitraClip placement, mild residual mitral  regurgitation, mean gradient 4 mmHg.. The mitral valve is normal in  structure. Mild mitral valve regurgitation. No evidence of mitral  stenosis. The mean mitral valve gradient is 4.5 mmHg. There  is a present in the mitral position.  7. Tricuspid valve regurgitation is moderate.  8. The aortic valve is normal in structure. Aortic valve regurgitation is  mild. No aortic stenosis is present.  9. The inferior vena cava is normal in size with greater than 50%  respiratory variability, suggesting right atrial pressure of 3 mmHg.   _______________   ECHO 03/04/20 ***  ASSESSMENT & PLAN:   Severe non rheumatic MR s/p TEER: has been on chronic aspirin and eliquis  Chronic combined S/D CHF: appears euvolemic today. Continue on hydralazine/nitrate combo, Lopressor and Lasix 80mg  BID.   Chronic afib: rate well controlled today. Continue on Eliquis 2.5mg  BID.   CAD s/p CABG/redo CABG/PCI: no chest pain.Continue Ranexa. Continue on medical therapy with aspirin, statin and BB.   CKD stage IV:    Medication Adjustments/Labs and Tests Ordered: Current medicines are reviewed at length with the patient today.  Concerns regarding medicines are outlined above.  Medication changes, Labs and Tests ordered today are listed in the Patient Instructions  below. There are no Patient Instructions on file for this visit.   Signed, Angelena Form, PA-C  03/03/2020 10:21 AM    [facility] [place], [place], [place]  [address] Phone: [phone]; Fax: [phone]

## 2020-03-04 ENCOUNTER — Other Ambulatory Visit (HOSPITAL_COMMUNITY): Payer: Medicare Other

## 2020-03-04 ENCOUNTER — Ambulatory Visit: Payer: Medicare Other | Admitting: Physician Assistant

## 2020-03-04 DIAGNOSIS — Z95818 Presence of other cardiac implants and grafts: Secondary | ICD-10-CM

## 2020-03-04 DIAGNOSIS — Z951 Presence of aortocoronary bypass graft: Secondary | ICD-10-CM

## 2020-03-04 DIAGNOSIS — I482 Chronic atrial fibrillation, unspecified: Secondary | ICD-10-CM

## 2020-03-04 DIAGNOSIS — I5042 Chronic combined systolic (congestive) and diastolic (congestive) heart failure: Secondary | ICD-10-CM

## 2020-03-04 DIAGNOSIS — N184 Chronic kidney disease, stage 4 (severe): Secondary | ICD-10-CM

## 2020-03-04 NOTE — Progress Notes (Deleted)
[facility] [place]                                       Cardiology Office Note    Date:  03/04/2020   ID:  [name], [name] 16-Sep-1932, MRN [id number]  PCP:  Jeremiah Ghent, MD  Cardiologist: Jeremiah Burow, MD/ Dr. Haroldine [name]  CC: 1 month s/p MitraClip  History of Present Illness:  [name] is a 86 y.o. male with a history of HTN, CAD s/p CABG, GI bleeding s/p sigmoid colectomy, chronic afib on Eliquis, CKD stage IV, DMT2, OSA, chronic combined S/D CHF and severe MR s/p MitraClip (02/06/20) who presents to clinic for follow up.   Patient's cardiac history dates back to 1996 when he presented with angina and ultimately underwent CABG by [name]. He did well for many years but required redo CABG in 2006. In 2015 he developed unstable angina. Catheterization at that time revealed high-grade stenosis in the LCx which was treated with a PCI and stenting. This was complicated by early stent failure requiring another intervention shortly after.  In 2016 he began to have recurrent episodes of GI bleeding.He was found to have both gastritis as well as bleeding diverticuli. He developed recurrent lower GI bleeding and ultimately required sigmoid colectomy because of recurrent GI bleeding. In 2019 he developed recurrent chest pain and dyspnea. Catheterization at that time revealed newly occluded vein graft to the right coronary artery with chronic occlusion of the native right coronary artery.There also remained high-grade ostial stenosis of obtuse marginal branch.Left internal mammary artery remain patent to the distal left anterior descending coronary artery and saphenous vein graft to a large diagonal branch also remain patent. Attempts at PCI and stenting of left circumflex were unsuccessful.  The patient remained stable until the past year when he began to experience worsening symptoms of congestive heart failure. Echocardiogram  06/2019 revealed moderate left ventricular systolic dysfunction with ejection fraction estimated 40 to 45%. There was akinesis of the mid and apical inferior walls as well as hypokinesis of the inferolateral walls, all consistent with previous infarction in the right coronary artery and/or left circumflex territory. There was moderately reduced right ventricular function and at least moderate mitral regurgitation. Symptoms persisted despite optimal medical therapy and the patient was eventually referred to the advanced heart failure clinic in January of this year where he was evaluated by Dr. Haroldine [name]. TEE 12/2020 revealed moderate left ventricular systolic dysfunction with EF 50 to 55% in the setting of severe mitral regurgitation. Right heart catheterization was performed demonstrating moderately elevated biventricular pressures with low cardiac output and prominent V waves on pulmonary capillary wedge tracing consistent with severe mitral regurgitation.  The patient was evaluated by the multidisciplinary valve team and underwent successful transcatheter edge-to-edge mitral valve repair using a single MitraClip NTW device placed A2/P2, reducing mitral regurgitation from 4+ to 1+ on 02/06/20. Post op echo showed EF 45-50%, normally functioning MitraClip with mild residual mitral regurgitation, mean gradient 4 mmHg. He was discharged on home aspirin and Eliquis.   Today he presents to clinic for follow up.    Past Medical History:  Diagnosis Date  . Age-related macular degeneration, wet, both eyes ([place])   . Anemia    Secondary to acute blood loss  . Anginal pain ([place])    last chest pain in Feb 2021  .  Arthritis    "mild in hands, knees, ankles" (11/13/2015)  . Atrial fibrillation ([place])    Consideration was given for atrial flutter ablation, but patient developed atrial fibrillation. Cardioversion was done. Dr. Caryl Comes decided to watch him clinically. November, 2011  . Atrial flutter [facility]) 07/2010    September, 2011   Hospital with PNA and cath done.Jeremiah KitchenMarland KitchenCoumadin.  Atrial flutter ablation planned, but  pt. then had atrial fibrillation,/outpatient conversion 09/08/10..NSR..plan to follow..Dr. Caryl Comes  . CAD (coronary artery disease)    Catheterization, September, 2011,  grafts patent from redo CABG,, medical therapy of coronary disease, consideration to proceeding with atrial flutter ablation  . Carotid artery disease ([place])    Doppler 09/18/2009 - 49% bilateral stenoses  . Carotid artery disease ([name])    49% bilateral, Doppler, November, 2010  . CHF (congestive heart failure) ([place])   . Chronic kidney disease (CKD), stage III (moderate)   . Diabetic peripheral neuropathy ([name])    feet  . Diverticulosis of colon with hemorrhage 2009   several unit diverticular bleed   . Erectile dysfunction    Mild  . Gout   . Hearing loss    wears hearing aids  . History of blood transfusion "several"   related to diverticular bleeding  . Hyperlipidemia   . Hypertension   . Mitral regurgitation    Mild, echo, September, 2011  . NSTEMI (non-ST elevated myocardial infarction) ([place]) 07/2010   at [facility] with repeat cath, rec medical mgmt   . OSA on CPAP    uses CPAP nightly  . Personal history of colonic polyps   . PNA (pneumonia) 9/11   NSTEMI at [facility] with repeat cath, rec medical mgmt   . RBBB (right bundle branch block)   . S/P mitral valve repair 02/06/2020   s/p TEER with a single MitraClip NTW placed on A2/P2  . Shoulder pain    "positional; better now" (11/13/2015)  . Skin cancer    R lower leg, per derm 2012  . Type II diabetes mellitus ([place])     Past Surgical History:  Procedure Laterality Date  . CARDIAC CATHETERIZATION  2006   Nuclear..slight lateral ischemia..medical therapy  . CARDIAC CATHETERIZATION  08/04/2010   grafts patent from redo CABG...medical Rx and ablate Atrial flutter (LV not injected)   . CARDIOVERSION  ~ 2010  . CAROTID ENDARTERECTOMY Right 1994  . CATARACT EXTRACTION W/  INTRAOCULAR LENS  IMPLANT, BILATERAL Bilateral   . COLON RESECTION N/A 01/13/2016   Procedure: EXPLORATORY LAPAROTOMY, LEFT AND SIGMOID COLON REMOVAL;  Surgeon: Ralene Ok, MD;  Location: [place];  Service: General;  Laterality: N/A;  Extended open left hemicolectomy and sigmoidectomy   . COLONOSCOPY N/A 11/14/2015   Procedure: COLONOSCOPY;  Surgeon: Manus Gunning, MD;  Location: [facility] ENDOSCOPY;  Service: Gastroenterology;  Laterality: N/A;  . COLONOSCOPY Left 01/05/2016   Procedure: COLONOSCOPY;  Surgeon: Manus Gunning, MD;  Location: [place];  Service: Gastroenterology;  Laterality: Left;  no sedation to start, moderate if needed  . COLONOSCOPY W/ POLYPECTOMY    . CORONARY ARTERY BYPASS GRAFT  1995; 2006   "X 3; X3"  . [place], 2006  . DOPPLER ECHOCARDIOGRAPHY  08/2008   EF 60%  . DOPPLER ECHOCARDIOGRAPHY  08/02/2010   65-70%  . DOPPLER ECHOCARDIOGRAPHY  07/2010   MR mild  . ESOPHAGOGASTRODUODENOSCOPY N/A 06/19/2014   Procedure: ESOPHAGOGASTRODUODENOSCOPY (EGD);  Surgeon: Jerene Bears, MD;  Location: [facility] ENDOSCOPY;  Service: Endoscopy;  Laterality: N/A;  .  ESOPHAGOGASTRODUODENOSCOPY N/A 11/14/2015   Procedure: ESOPHAGOGASTRODUODENOSCOPY (EGD);  Surgeon: Manus Gunning, MD;  Location: [place];  Service: Gastroenterology;  Laterality: N/A;  . FLEXIBLE SIGMOIDOSCOPY N/A 11/16/2015   Procedure: FLEXIBLE SIGMOIDOSCOPY;  Surgeon: Manus Gunning, MD;  Location: [place];  Service: Gastroenterology;  Laterality: N/A;  . LAPAROSCOPIC CHOLECYSTECTOMY  2008  . LEFT HEART CATH N/A 06/14/2014   Procedure: LEFT HEART CATH;  Surgeon: Sinclair Grooms, MD;  Location: [facility] CATH LAB;  Service: Cardiovascular;  Laterality: N/A;  . LEFT HEART CATH AND CORONARY ANGIOGRAPHY N/A 08/02/2018   Procedure: LEFT HEART CATH AND CORONARY ANGIOGRAPHY;  Surgeon: [name], MD;  Location: [place] CV LAB;  Service: Cardiovascular;  Laterality: N/A;   . LEFT HEART CATHETERIZATION WITH CORONARY/GRAFT ANGIOGRAM N/A 06/11/2014   Procedure: LEFT HEART CATHETERIZATION WITH Beatrix Fetters;  Surgeon: Sinclair Grooms, MD;  Location: [facility] CATH LAB;  Service: Cardiovascular;  Laterality: N/A;  . MITRAL VALVE REPAIR N/A 02/06/2020   Procedure: MITRAL VALVE REPAIR;  Surgeon: Sherren Mocha, MD;  Location: [place] CV LAB;  Service: Cardiovascular;  Laterality: N/A;  . PERCUTANEOUS CORONARY STENT INTERVENTION (PCI-S) N/A 06/13/2014   Procedure: PERCUTANEOUS CORONARY STENT INTERVENTION (PCI-S);  Surgeon: Sinclair Grooms, MD;  Location: [facility] CATH LAB;  Service: Cardiovascular;  Laterality: N/A;  . RIGHT HEART CATH N/A 01/06/2020   Procedure: RIGHT HEART CATH;  Surgeon: Jolaine Artist, MD;  Location: [place] CV LAB;  Service: Cardiovascular;  Laterality: N/A;  . SKIN CANCER EXCISION Left 10/2015   calf  . SKIN CANCER EXCISION Right 2014?   chest  . TEE WITHOUT CARDIOVERSION N/A 01/06/2020   Procedure: TRANSESOPHAGEAL ECHOCARDIOGRAM (TEE);  Surgeon: Jolaine Artist, MD;  Location: [facility] ENDOSCOPY;  Service: Cardiovascular;  Laterality: N/A;  . TONSILLECTOMY    . VASECTOMY      Current Medications: Outpatient Medications Prior to Visit  Medication Sig Dispense Refill  . amoxicillin (AMOXIL) 500 MG capsule Take 4 tablets (2000 mg) by mouth 60 min prior to any dental procedures 8 capsule 2  . aspirin 81 MG tablet Take 81 mg by mouth daily.    . BD INSULIN SYRINGE ULTRAFINE 31G X 5/16" 0.3 ML MISC USE DAILY AS INSTRUCTED 100 each 2  . cetirizine (ZYRTEC) 10 MG tablet Take 10 mg by mouth daily as needed for allergies.    . Cholecalciferol (VITAMIN D) 1000 UNITS capsule Take 1,000 Units by mouth daily.     . colchicine 0.6 MG tablet Take 1 tablet (0.6 mg total) by mouth daily as needed (Fri, Sat, Sun, Mon).    [name] ELIQUIS 2.5 MG TABS tablet TAKE 1 TABLET TWICE A DAY 180 tablet 3  . fish oil-omega-3 fatty acids 1000 MG capsule Take 1 g by  mouth daily.     . furosemide (LASIX) 80 MG tablet Take 1 tablet (80 mg total) by mouth 2 (two) times daily. 60 tablet 5  . glucose blood (FREESTYLE LITE) test strip USE TO TEST BLOOD SUGAR ONCE DAILY AND AS NEEDED FOR DIABETES MELLITUS 300 each 3  . hydrALAZINE (APRESOLINE) 25 MG tablet Take 1 tablet (25 mg total) by mouth 2 (two) times daily. 60 tablet 3  . isosorbide mononitrate (IMDUR) 60 MG 24 hr tablet TAKE ONE AND ONE-HALF TABLETS (90 MG) IN THE MORNING AND 1 TABLET (60 MG) IN THE EVENING  (NEW INSTRUCTIONS) 270 tablet 3  . LANTUS 100 UNIT/ML injection INJECT 20 TO 25 UNITS AT BEDTIME 40  mL 3  . magnesium oxide (MAG-OX) 400 MG tablet Take 400 mg by mouth daily.     . metoprolol tartrate (LOPRESSOR) 50 MG tablet TAKE 1 TABLET TWICE A DAY 180 tablet 1  . Multiple Vitamin (MULTIVITAMIN WITH MINERALS) TABS Take 1 tablet by mouth daily.    . nitroGLYCERIN (NITROSTAT) 0.4 MG SL tablet Place 1 tablet (0.4 mg total) under the tongue every 5 (five) minutes as needed for chest pain. 100 tablet 1  . Potassium Gluconate (K-99) 595 MG CAPS Take 595 mg by mouth daily.     . ranolazine (RANEXA) 500 MG 12 hr tablet TAKE 1 TABLET TWICE A DAY 180 tablet 2  . rosuvastatin (CRESTOR) 40 MG tablet Take 1 tablet (40 mg total) by mouth at bedtime. 90 tablet 3   No facility-administered medications prior to visit.     Allergies:   Patient has no known allergies.   Social History   Socioeconomic History  . Marital status: Widowed    Spouse name: Not on file  . Number of children: 2  . Years of education: Not on file  . Highest education level: Not on file  Occupational History  . 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 Smoking status: Former Smoker    Packs/day: 1.00    Years: 8.00    Pack years: 8.00    Types: Cigarettes    Quit date: 1958    Years since quitting: 63.3  . Smokeless tobacco: Never Used  . Tobacco comment: "quit smoking cigarettes in  1958"  Substance and Sexual Activity  . Alcohol use: No    Alcohol/week: 0.0 standard drinks  . Drug use: No  . Sexual activity: Yes  Other Topics Concern  . Not on file  Social History Narrative   From [place].  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     Lives with girlfriend [name].  Widowed 12/2005.   Social Determinants of Health   Financial Resource Strain: Low Risk   . Difficulty of Paying Living Expenses: Not hard at all  Food Insecurity: No Food Insecurity  . Worried About Charity fundraiser in the Last Year: Never true  . Ran Out of Food in the Last Year: Never true  Transportation Needs: No Transportation Needs  . Lack of Transportation (Medical): No  . Lack of Transportation (Non-Medical): No  Physical Activity: Inactive  . Days of Exercise per Week: 0 days  . Minutes of Exercise per Session: 0 min  Stress: No Stress Concern Present  . Feeling of Stress : Only a little  Social Connections:   . Frequency of Communication with Friends and Family:   . Frequency of Social Gatherings with Friends and Family:   . Attends Religious Services:   . Active Member of Clubs or Organizations:   . Attends Archivist Meetings:   [name] Marital Status:      Family History:  The patient's family history includes Arthritis in his sister; Cancer in his sister; Diabetes in his mother and sister; Heart disease in his father and sister; Kidney disease in his mother; Stroke in his mother.     ROS:   Please see the history of present illness.    ROS All other systems reviewed and are negative.   PHYSICAL EXAM:   VS:  There were no vitals taken for this visit.   GEN: Well nourished, well developed,  in no acute distress HEENT: normal Neck: no JVD or masses Cardiac: ***RRR; no murmurs, rubs, or gallops,no edema  Respiratory:  clear to auscultation bilaterally, normal work of breathing GI: soft, nontender, nondistended, + BS MS: no deformity or  atrophy Skin: warm and dry, no rash Neuro:  Alert and Oriented x 3, Strength and sensation are intact Psych: euthymic mood, full affect   Wt Readings from Last 3 Encounters:  02/13/20 188 lb 9.6 oz (85.5 kg)  02/06/20 188 lb (85.3 kg)  02/03/20 192 lb 2 oz (87.1 kg)      Studies/Labs Reviewed:   EKG:  EKG is*** ordered today.  The ekg ordered today demonstrates ***  Recent Labs: 02/03/2020: ALT 22; B Natriuretic Peptide 437.4 02/07/2020: BUN 31; Creatinine, Ser 2.30; Hemoglobin 11.8; Platelets 114; Potassium 4.1; Sodium 139   Lipid Panel    Component Value Date/Time   CHOL 102 06/21/2019 1137   TRIG 106.0 06/21/2019 1137   HDL 25.20 (L) 06/21/2019 1137   CHOLHDL 4 06/21/2019 1137   VLDL 21.2 06/21/2019 1137   LDLCALC 56 06/21/2019 1137    Additional studies/ records that were reviewed today include:  01/17/2020 MITRAL VALVE REPAIR  Conclusion Successful transcatheter edge-to-edge mitral valve repair using a single MitraClip NTW device placed A2/P2, reducing mitral regurgitation from 4+ to 1+    _______________   ECHO 02/07/20 IMPRESSIONS 1. Day 1 post NTW MitraClip placement, mild residual mitral  regurgitation, mean gradient 4 mmHg.  2. Left ventricular ejection fraction, by estimation, is 45 to 50%. The  left ventricle has mildly decreased function. The left ventricle has no  regional wall motion abnormalities. There is moderate left ventricular  hypertrophy. Left ventricular  diastolic parameters are consistent with Grade II diastolic dysfunction  (pseudonormalization). Elevated left ventricular end-diastolic pressure.  There is the interventricular septum is flattened in systole and diastole,  consistent with right ventricular  pressure and volume overload.  3. Right ventricular systolic function is normal. The right ventricular  size is normal. There is moderately elevated pulmonary artery systolic  pressure. The estimated right ventricular systolic  pressure is 75.1 mmHg.  4. Left atrial size was moderately dilated.  5. Right atrial size was moderately dilated.  6. Day 1 post NTW MitraClip placement, mild residual mitral  regurgitation, mean gradient 4 mmHg.. The mitral valve is normal in  structure. Mild mitral valve regurgitation. No evidence of mitral  stenosis. The mean mitral valve gradient is 4.5 mmHg. There  is a present in the mitral position.  7. Tricuspid valve regurgitation is moderate.  8. The aortic valve is normal in structure. Aortic valve regurgitation is  mild. No aortic stenosis is present.  9. The inferior vena cava is normal in size with greater than 50%  respiratory variability, suggesting right atrial pressure of 3 mmHg.   _______________   ECHO 03/04/20 ***    ASSESSMENT & PLAN:   Severe non rheumatic MR s/p TEER:has been on chronic aspirin and eliquis  Chroniccombined S/DCHF: appears euvolemic today. Continue on hydralazine/nitrate combo, Lopressor and Lasix 80mg  BID.   Chronic afib: rate well controlled today. Continue on Eliquis 2.5mg  BID.   CAD s/p CABG/redo CABG/PCI: no chest pain.Continue Ranexa. Continue on medical therapy with aspirin, statin and BB.   CKD stage IV:     Medication Adjustments/Labs and Tests Ordered: Current medicines are reviewed at length with the patient today.  Concerns regarding medicines are outlined above.  Medication changes, Labs and Tests ordered today are listed in  the Patient Instructions below. There are no Patient Instructions on file for this visit.   Signed, Angelena Form, PA-C  03/04/2020 3:27 PM    [facility] [place], [place], [place]  [address] Phone: [phone]; Fax: [phone]

## 2020-03-05 NOTE — Progress Notes (Addendum)
[facility] [place]                                       Cardiology Office Note    Date:  03/16/2020   ID:  BOBAK OGUINN, DOB 05/31/1932, MRN [id number]  PCP:  Tonia Ghent, MD  Cardiologist:Jonathan Gwenlyn Found, MD/ Dr. Haroldine Laws  CC:1 months/p MitraClip  History of Present Illness:  Jeremiah Damiano Riddleis a 86 y.o.malewith a history of HTN, CAD s/p CABG, GI bleeding s/p sigmoid colectomy, chronic afib on Eliquis, CKD stage IV, DMT2, OSA, chronic combined S/D CHF and severe MR s/p MitraClip (02/06/20) who presents to clinic for follow up.   Patient's cardiac history dates back to 1996 when he presented with angina and ultimately underwent CABG by [name]. He did well for many years but required redo CABG in 2006. In 2015 he developed unstable angina. Catheterization at that time revealed high-grade stenosis in the LCx which was treated with a PCI and stenting. This was complicated by early stent failure requiring another intervention shortly after.  In 2016 he began to have recurrent episodes of GI bleeding.He was found to have both gastritis as well as bleeding diverticuli. He developed recurrent lower GI bleeding and ultimately required sigmoid colectomy because of recurrent GI bleeding. In 2019 he developed recurrent chest pain and dyspnea. Catheterization at that time revealed newly occluded vein graft to the right coronary artery with chronic occlusion of the native right coronary artery.There also remained high-grade ostial stenosis of obtuse marginal branch.Left internal mammary artery remain patent to the distal left anterior descending coronary artery and saphenous vein graft to a large diagonal branch also remain patent. Attempts at PCI and stenting of left circumflex were unsuccessful.  The patient remained stable until the past year when he began to experience worsening symptoms of congestive heart failure. Echocardiogram  06/2019 revealed moderate left ventricular systolic dysfunction with ejection fraction estimated 40 to 45%. There was akinesis of the mid and apical inferior walls as well as hypokinesis of the inferolateral walls, all consistent with previous infarction in the right coronary artery and/or left circumflex territory. There was moderately reduced right ventricular function and at least moderate mitral regurgitation. Symptoms persisted despite optimal medical therapy and the patient was eventually referred to the advanced heart failure clinic in January of this year where he was evaluated by Dr. Haroldine Laws. TEE 12/2020 revealed moderate left ventricular systolic dysfunction with EF 50 to 55% in the setting of severe mitral regurgitation. Right heart catheterization was performed demonstrating moderately elevated biventricular pressures with low cardiac output and prominent V waves on pulmonary capillary wedge tracing consistent with severe mitral regurgitation.  The patient was evaluated by the multidisciplinary valve team and underwent successful transcatheter edge-to-edge mitral valve repair using a single MitraClip NTW device placed A2/P2, reducing mitral regurgitation from 4+ to 1+ on 02/06/20. Post op echo showed EF 45-50%, normally functioning MitraClip with mild residual mitral regurgitation, mean gradient 4 mmHg. He was discharged on home aspirin and Eliquis.   Today he presents to clinic for follow up. Occasionally gets some right sided pressure in his chest that he thinks may be musculoskeletal. Not related to exertion. Comes and goes sporadically. No CP or SOB. No LE edema, orthopnea or PND. No dizziness or syncope. No blood in stool or urine. No palpitations. He felt better almost immediately after  MitraClip.   Past Medical History:  Diagnosis Date  . Age-related macular degeneration, wet, both eyes ([place])   . Anemia    Secondary to acute blood loss  . Anginal pain ([place])    last chest pain in Feb  2021  . Arthritis    "mild in hands, knees, ankles" (11/13/2015)  . Atrial fibrillation ([place])    Consideration was given for atrial flutter ablation, but patient developed atrial fibrillation. Cardioversion was done. Dr. Caryl Comes decided to watch him clinically. November, 2011  . Atrial flutter [facility]) 07/2010   September, 2011   Hospital with PNA and cath done.Marland KitchenMarland KitchenCoumadin.  Atrial flutter ablation planned, but  pt. then had atrial fibrillation,/outpatient conversion 09/08/10..NSR..plan to follow..Dr. Caryl Comes  . CAD (coronary artery disease)    Catheterization, September, 2011,  grafts patent from redo CABG,, medical therapy of coronary disease, consideration to proceeding with atrial flutter ablation  . Carotid artery disease ([place])    Doppler 09/18/2009 - 49% bilateral stenoses  . Carotid artery disease ([name])    49% bilateral, Doppler, November, 2010  . CHF (congestive heart failure) ([place])   . Chronic kidney disease (CKD), stage III (moderate)   . Diabetic peripheral neuropathy ([name])    feet  . Diverticulosis of colon with hemorrhage 2009   several unit diverticular bleed   . Erectile dysfunction    Mild  . Gout   . Hearing loss    wears hearing aids  . History of blood transfusion "several"   related to diverticular bleeding  . Hyperlipidemia   . Hypertension   . Mitral regurgitation    Mild, echo, September, 2011  . NSTEMI (non-ST elevated myocardial infarction) ([place]) 07/2010   at [facility] with repeat cath, rec medical mgmt   . OSA on CPAP    uses CPAP nightly  . Personal history of colonic polyps   . PNA (pneumonia) 9/11   NSTEMI at [facility] with repeat cath, rec medical mgmt   . RBBB (right bundle branch block)   . S/P mitral valve repair 02/06/2020   s/p TEER with a single MitraClip NTW placed on A2/P2  . Shoulder pain    "positional; better now" (11/13/2015)  . Skin cancer    R lower leg, per derm 2012  . Type II diabetes mellitus ([place])     Past Surgical History:  Procedure  Laterality Date  . CARDIAC CATHETERIZATION  2006   Nuclear..slight lateral ischemia..medical therapy  . CARDIAC CATHETERIZATION  08/04/2010   grafts patent from redo CABG...medical Rx and ablate Atrial flutter (LV not injected)   . CARDIOVERSION  ~ 2010  . CAROTID ENDARTERECTOMY Right 1994  . CATARACT EXTRACTION W/ INTRAOCULAR LENS  IMPLANT, BILATERAL Bilateral   . COLON RESECTION N/A 01/13/2016   Procedure: EXPLORATORY LAPAROTOMY, LEFT AND SIGMOID COLON REMOVAL;  Surgeon: Ralene Ok, MD;  Location: [place];  Service: General;  Laterality: N/A;  Extended open left hemicolectomy and sigmoidectomy   . COLONOSCOPY N/A 11/14/2015   Procedure: COLONOSCOPY;  Surgeon: Manus Gunning, MD;  Location: [facility] ENDOSCOPY;  Service: Gastroenterology;  Laterality: N/A;  . COLONOSCOPY Left 01/05/2016   Procedure: COLONOSCOPY;  Surgeon: Manus Gunning, MD;  Location: [place];  Service: Gastroenterology;  Laterality: Left;  no sedation to start, moderate if needed  . COLONOSCOPY W/ POLYPECTOMY    . CORONARY ARTERY BYPASS GRAFT  1995; 2006   "X 3; X3"  . [place], 2006  . DOPPLER ECHOCARDIOGRAPHY  08/2008   EF  60%  . DOPPLER ECHOCARDIOGRAPHY  08/02/2010   65-70%  . DOPPLER ECHOCARDIOGRAPHY  07/2010   MR mild  . ESOPHAGOGASTRODUODENOSCOPY N/A 06/19/2014   Procedure: ESOPHAGOGASTRODUODENOSCOPY (EGD);  Surgeon: Jerene Bears, MD;  Location[facility] ENDOSCOPY;  Service: Endoscopy;  Laterality: N/A;  . ESOPHAGOGASTRODUODENOSCOPY N/A 11/14/2015   Procedure: ESOPHAGOGASTRODUODENOSCOPY (EGD);  Surgeon: Manus Gunning, MD;  Location: [place];  Service: Gastroenterology;  Laterality: N/A;  . FLEXIBLE SIGMOIDOSCOPY N/A 11/16/2015   Procedure: FLEXIBLE SIGMOIDOSCOPY;  Surgeon: Manus Gunning, MD;  Location: [place];  Service: Gastroenterology;  Laterality: N/A;  . LAPAROSCOPIC CHOLECYSTECTOMY  2008  . LEFT HEART CATH N/A 06/14/2014   Procedure: LEFT HEART CATH;   Surgeon: Sinclair Grooms, MD;  Location: [facility] CATH LAB;  Service: Cardiovascular;  Laterality: N/A;  . LEFT HEART CATH AND CORONARY ANGIOGRAPHY N/A 08/02/2018   Procedure: LEFT HEART CATH AND CORONARY ANGIOGRAPHY;  Surgeon: [name], MD;  Location: [place] CV LAB;  Service: Cardiovascular;  Laterality: N/A;  . LEFT HEART CATHETERIZATION WITH CORONARY/GRAFT ANGIOGRAM N/A 06/11/2014   Procedure: LEFT HEART CATHETERIZATION WITH Beatrix Fetters;  Surgeon: Sinclair Grooms, MD;  Location: [facility] CATH LAB;  Service: Cardiovascular;  Laterality: N/A;  . MITRAL VALVE REPAIR N/A 02/06/2020   Procedure: MITRAL VALVE REPAIR;  Surgeon: Sherren Mocha, MD;  Location: [place] CV LAB;  Service: Cardiovascular;  Laterality: N/A;  . PERCUTANEOUS CORONARY STENT INTERVENTION (PCI-S) N/A 06/13/2014   Procedure: PERCUTANEOUS CORONARY STENT INTERVENTION (PCI-S);  Surgeon: Sinclair Grooms, MD;  Location: [facility] CATH LAB;  Service: Cardiovascular;  Laterality: N/A;  . RIGHT HEART CATH N/A 01/06/2020   Procedure: RIGHT HEART CATH;  Surgeon: Jolaine Artist, MD;  Location: [place] CV LAB;  Service: Cardiovascular;  Laterality: N/A;  . SKIN CANCER EXCISION Left 10/2015   calf  . SKIN CANCER EXCISION Right 2014?   chest  . TEE WITHOUT CARDIOVERSION N/A 01/06/2020   Procedure: TRANSESOPHAGEAL ECHOCARDIOGRAM (TEE);  Surgeon: Jolaine Artist, MD;  Location: [facility] ENDOSCOPY;  Service: Cardiovascular;  Laterality: N/A;  . TONSILLECTOMY    . VASECTOMY      Current Medications: Outpatient Medications Prior to Visit  Medication Sig Dispense Refill  . amoxicillin (AMOXIL) 500 MG capsule Take 4 tablets (2000 mg) by mouth 60 min prior to any dental procedures 8 capsule 2  . aspirin 81 MG tablet Take 81 mg by mouth daily.    . BD INSULIN SYRINGE ULTRAFINE 31G X 5/16" 0.3 ML MISC USE DAILY AS INSTRUCTED 100 each 2  . cetirizine (ZYRTEC) 10 MG tablet Take 10 mg by mouth daily as needed for allergies.    .  Cholecalciferol (VITAMIN D) 1000 UNITS capsule Take 1,000 Units by mouth daily.     . colchicine 0.6 MG tablet Take 1 tablet (0.6 mg total) by mouth daily as needed (Fri, Sat, Sun, Mon).    [name] ELIQUIS 2.5 MG TABS tablet TAKE 1 TABLET TWICE A DAY 180 tablet 3  . fish oil-omega-3 fatty acids 1000 MG capsule Take 1 g by mouth daily.     . furosemide (LASIX) 80 MG tablet Take 1 tablet (80 mg total) by mouth 2 (two) times daily. 60 tablet 5  . glucose blood (FREESTYLE LITE) test strip USE TO TEST BLOOD SUGAR ONCE DAILY AND AS NEEDED FOR DIABETES MELLITUS 300 each 3  . hydrALAZINE (APRESOLINE) 25 MG tablet Take 1 tablet (25 mg total) by mouth 2 (two) times daily. 60 tablet 3  .  isosorbide mononitrate (IMDUR) 60 MG 24 hr tablet TAKE ONE AND ONE-HALF TABLETS (90 MG) IN THE MORNING AND 1 TABLET (60 MG) IN THE EVENING  (NEW INSTRUCTIONS) 270 tablet 3  . LANTUS 100 UNIT/ML injection INJECT 20 TO 25 UNITS AT BEDTIME 40 mL 3  . magnesium oxide (MAG-OX) 400 MG tablet Take 400 mg by mouth daily.     . metoprolol tartrate (LOPRESSOR) 50 MG tablet TAKE 1 TABLET TWICE A DAY 180 tablet 1  . Multiple Vitamin (MULTIVITAMIN WITH MINERALS) TABS Take 1 tablet by mouth daily.    . nitroGLYCERIN (NITROSTAT) 0.4 MG SL tablet Place 1 tablet (0.4 mg total) under the tongue every 5 (five) minutes as needed for chest pain. 100 tablet 1  . Potassium Gluconate (K-99) 595 MG CAPS Take 595 mg by mouth daily.     . ranolazine (RANEXA) 500 MG 12 hr tablet TAKE 1 TABLET TWICE A DAY 180 tablet 2  . rosuvastatin (CRESTOR) 40 MG tablet Take 1 tablet (40 mg total) by mouth at bedtime. 90 tablet 3   No facility-administered medications prior to visit.     Allergies:   Patient has no known allergies.   Social History   Socioeconomic History  . Marital status: Widowed    Spouse name: Not on file  . Number of children: 2  . Years of education: Not on file  . Highest education level: Not on file  Occupational History  . 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 Smoking status: Former Smoker    Packs/day: 1.00    Years: 8.00    Pack years: 8.00    Types: Cigarettes    Quit date: 1958    Years since quitting: 63.3  . Smokeless tobacco: Never Used  . Tobacco comment: "quit smoking cigarettes in 1958"  Substance and Sexual Activity  . Alcohol use: No    Alcohol/week: 0.0 standard drinks  . Drug use: No  . Sexual activity: Yes  Other Topics Concern  . Not on file  Social History Narrative   From [place].  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     Lives with girlfriend [name].  Widowed 12/2005.   Social Determinants of Health   Financial Resource Strain: Low Risk   . Difficulty of Paying Living Expenses: Not hard at all  Food Insecurity: No Food Insecurity  . Worried About Charity fundraiser in the Last Year: Never true  . Ran Out of Food in the Last Year: Never true  Transportation Needs: No Transportation Needs  . Lack of Transportation (Medical): No  . Lack of Transportation (Non-Medical): No  Physical Activity: Inactive  . Days of Exercise per Week: 0 days  . Minutes of Exercise per Session: 0 min  Stress: No Stress Concern Present  . Feeling of Stress : Only a little  Social Connections:   . Frequency of Communication with Friends and Family:   . Frequency of Social Gatherings with Friends and Family:   . Attends Religious Services:   . Active Member of Clubs or Organizations:   . Attends Archivist Meetings:   [name] Marital Status:      Family History:  The patient's family history includes Arthritis in his sister; Cancer in his sister; Diabetes in his mother and sister; Heart disease in his father and sister; Kidney disease in his mother; Stroke in his mother.  ROS:   Please see the history of present illness.    ROS All other systems reviewed and are negative.   PHYSICAL EXAM:   VS:  BP 140/60   Pulse  (!) 58   Ht 5\' 7"  (1.702 m)   Wt 190 lb (86.2 kg)   SpO2 98%   BMI 29.76 kg/m    GEN: Well nourished, well developed, in no acute distress HEENT: normal Neck: no JVD or masses Cardiac: RRR; no murmurs, rubs, or gallops,no edema  Respiratory:  clear to auscultation bilaterally, normal work of breathing GI: soft, nontender, nondistended, + BS MS: no deformity or atrophy Skin: warm and dry, no rash Neuro:  Alert and Oriented x 3, Strength and sensation are intact Psych: euthymic mood, full affect   Wt Readings from Last 3 Encounters:  03/12/20 190 lb (86.2 kg)  02/13/20 188 lb 9.6 oz (85.5 kg)  02/06/20 188 lb (85.3 kg)      Studies/Labs Reviewed:   EKG:  EKG is NOT ordered today.    Recent Labs: 02/03/2020: ALT 22; B Natriuretic Peptide 437.4 02/07/2020: BUN 31; Creatinine, Ser 2.30; Hemoglobin 11.8; Platelets 114; Potassium 4.1; Sodium 139   Lipid Panel    Component Value Date/Time   CHOL 102 06/21/2019 1137   TRIG 106.0 06/21/2019 1137   HDL 25.20 (L) 06/21/2019 1137   CHOLHDL 4 06/21/2019 1137   VLDL 21.2 06/21/2019 1137   LDLCALC 56 06/21/2019 1137    Additional studies/ records that were reviewed today include:  01/17/2020 MITRAL VALVE REPAIR  Conclusion Successful transcatheter edge-to-edge mitral valve repair using a single MitraClip NTW device placed A2/P2, reducing mitral regurgitation from 4+ to 1+    _______________   ECHO 02/07/20 IMPRESSIONS 1. Day 1 post NTW MitraClip placement, mild residual mitral regurgitation, mean gradient 4 mmHg.  2. Left ventricular ejection fraction, by estimation, is 45 to 50%. The left ventricle has mildly decreased function. The left ventricle has no  regional wall motion abnormalities. There is moderate left ventricular hypertrophy. Left ventricular  diastolic parameters are consistent with Grade II diastolic dysfunction (pseudonormalization). Elevated left ventricular end-diastolic pressure.  There is the  interventricular septum is flattened in systole and diastole, consistent with right ventricular  pressure and volume overload.  3. Right ventricular systolic function is normal. The right ventricular size is normal. There is moderately elevated pulmonary artery systolic pressure. The estimated right ventricular systolic pressure is 57.8 mmHg.  4. Left atrial size was moderately dilated.  5. Right atrial size was moderately dilated.  6. Day 1 post NTW MitraClip placement, mild residual mitral regurgitation, mean gradient 4 mmHg.. The mitral valve is normal in structure. Mild mitral valve regurgitation. No evidence of mitral stenosis. The mean mitral valve gradient is 4.5 mmHg. There is a present in the mitral position.  7. Tricuspid valve regurgitation is moderate.  8. The aortic valve is normal in structure. Aortic valve regurgitation is  mild. No aortic stenosis is present.  9. The inferior vena cava is normal in size with greater than 50%  respiratory variability, suggesting right atrial pressure of 3 mmHg.   _______________   ECHO4/21/21 IMPRESSIONS  1. Posterior lateral and inferior basal wall hypokinesis . Left ventricular ejection fraction, by estimation, is 45 to 50%. The left ventricle has mildly decreased function. The left ventricle demonstrates regional wall motion abnormalities (see scoring  diagram/findings for description). Left ventricular diastolic parameters are indeterminate.  2. Right ventricular systolic function is normal. The right ventricular size is  normal. There is moderately elevated pulmonary artery systolic pressure.  3. Left atrial size was moderately dilated.  4. There is significant shunting seen at site of trans septal puncture for mitral clip proceedure It is mostly left to right but is bidirectional with continous dopple signal noted . Evidence of atrial level shunting detected by color flow Doppler.  5. Post Mitral clip A2P2 edge to edge repair  with single NTW device. Trival residual MR with mean gradient now 6 mmHg compared to 4 mmHg implant MVA 2.4 cm2. The mitral valve has been repaired/replaced. Trivial mitral valve regurgitation. No evidence of  mitral stenosis. There is a Mitra-Clip present in the mitral position. Procedure Date: 02/06/2020.  6. The aortic valve is tricuspid. Aortic valve regurgitation is not visualized. Severe sclerosis with no stenosis.  7. The inferior vena cava is dilated in size with >50% respiratory variability, suggesting right atrial pressure of 8 mmHg.  Comparison(s): 02/07/20 EF 45-50%. PA pressure 72mmHg. Mild MR. MV 67mmHg mean PG.    ASSESSMENT & PLAN:   Severe non rheumatic MR s/p TEER:echo 4/29 showed EF 45-50%, s/p A2P2 edge to edge repair with single NTW device. There was trival residual MR with mean gradient now 6 mmHg compared to 4 mmHg implant MVA 2.4 cm2. There is significant shunting at transeptal puncture (mostly L-->R but bidirectional with continuous doppler). I reviewed with Dr. Burt Knack who felt it is mostly left to right flow and not problematic at this time. He has had marked symptomatic improvement since TEER and has NYHA class I symptoms. He has been on chronic aspirin and eliquis, which was continued. He understands the need for ongoing SBE prophylaxis; he has amoxicillin. I will see him back in 1 year for follow up and echo.  Chroniccombined S/DCHF: appears euvolemic today. Continue on hydralazine/nitrate combo, Lopressor and Lasix 80mg  BID. EF 45-50% by most recent echo.   Chronic afib: rate well controlled today. Continue on Eliquis 2.5mg  BID (weight >80kg, creat >1.4, age >45 yo).  CAD s/p CABG/redo CABG/PCI: no chest pain. Continue Ranexa. Continue on medical therapy with aspirin, statin and BB.   CKD stage IV: creat has remained stable ~2.3   Medication Adjustments/Labs and Tests Ordered: Current medicines are reviewed at length with the patient today.  Concerns  regarding medicines are outlined above.  Medication changes, Labs and Tests ordered today are listed in the Patient Instructions below. Patient Instructions  Medication Instructions:  Your provider recommends that you continue on your current medications as directed. Please refer to the Current Medication list given to you today.   *If you need a refill on your cardiac medications before your next appointment, please call your pharmacy*  Follow-Up: Dr. Clayborne Dana office will call you to arrange follow-up appointment in 3 months.  We will call you to arrange your 1 year MitraClip echo and office visit.    Signed, Angelena Form, PA-C  03/16/2020 8:02 AM    [facility] [address], [place], [place]  [address] Phone: [phone]; Fax: [phone]

## 2020-03-11 ENCOUNTER — Telehealth: Payer: Medicare Other | Admitting: Physician Assistant

## 2020-03-11 DIAGNOSIS — Z95818 Presence of other cardiac implants and grafts: Secondary | ICD-10-CM

## 2020-03-11 DIAGNOSIS — I482 Chronic atrial fibrillation, unspecified: Secondary | ICD-10-CM

## 2020-03-11 DIAGNOSIS — N184 Chronic kidney disease, stage 4 (severe): Secondary | ICD-10-CM

## 2020-03-11 DIAGNOSIS — Z951 Presence of aortocoronary bypass graft: Secondary | ICD-10-CM

## 2020-03-11 DIAGNOSIS — I5042 Chronic combined systolic (congestive) and diastolic (congestive) heart failure: Secondary | ICD-10-CM

## 2020-03-12 ENCOUNTER — Ambulatory Visit (INDEPENDENT_AMBULATORY_CARE_PROVIDER_SITE_OTHER): Payer: Medicare Other | Admitting: Physician Assistant

## 2020-03-12 ENCOUNTER — Encounter: Payer: Self-pay | Admitting: Physician Assistant

## 2020-03-12 ENCOUNTER — Other Ambulatory Visit: Payer: Self-pay

## 2020-03-12 ENCOUNTER — Ambulatory Visit (HOSPITAL_COMMUNITY): Payer: Medicare Other | Attending: Internal Medicine

## 2020-03-12 VITALS — BP 140/60 | HR 58 | Ht 67.0 in | Wt 190.0 lb

## 2020-03-12 DIAGNOSIS — Z954 Presence of other heart-valve replacement: Secondary | ICD-10-CM | POA: Diagnosis not present

## 2020-03-12 DIAGNOSIS — I5042 Chronic combined systolic (congestive) and diastolic (congestive) heart failure: Secondary | ICD-10-CM | POA: Diagnosis not present

## 2020-03-12 DIAGNOSIS — Z95818 Presence of other cardiac implants and grafts: Secondary | ICD-10-CM

## 2020-03-12 DIAGNOSIS — I6523 Occlusion and stenosis of bilateral carotid arteries: Secondary | ICD-10-CM | POA: Diagnosis not present

## 2020-03-12 DIAGNOSIS — N184 Chronic kidney disease, stage 4 (severe): Secondary | ICD-10-CM | POA: Diagnosis not present

## 2020-03-12 DIAGNOSIS — I2510 Atherosclerotic heart disease of native coronary artery without angina pectoris: Secondary | ICD-10-CM | POA: Diagnosis not present

## 2020-03-12 DIAGNOSIS — Z9889 Other specified postprocedural states: Secondary | ICD-10-CM

## 2020-03-12 DIAGNOSIS — I482 Chronic atrial fibrillation, unspecified: Secondary | ICD-10-CM | POA: Diagnosis not present

## 2020-03-12 MED ORDER — PERFLUTREN LIPID MICROSPHERE
1.0000 mL | INTRAVENOUS | Status: AC | PRN
  Administered 2020-03-12: 1 mL via INTRAVENOUS

## 2020-03-12 NOTE — Patient Instructions (Signed)
Medication Instructions:  Your provider recommends that you continue on your current medications as directed. Please refer to the Current Medication list given to you today.   *If you need a refill on your cardiac medications before your next appointment, please call your pharmacy*  Follow-Up: Dr. Clayborne Dana office will call you to arrange follow-up appointment in 3 months.  We will call you to arrange your 1 year MitraClip echo and office visit.

## 2020-03-19 ENCOUNTER — Ambulatory Visit: Payer: Medicare Other | Admitting: Physician Assistant

## 2020-03-23 DIAGNOSIS — M9080 Osteopathy in diseases classified elsewhere, unspecified site: Secondary | ICD-10-CM | POA: Diagnosis not present

## 2020-03-23 DIAGNOSIS — I10 Essential (primary) hypertension: Secondary | ICD-10-CM | POA: Diagnosis not present

## 2020-03-23 DIAGNOSIS — E559 Vitamin D deficiency, unspecified: Secondary | ICD-10-CM | POA: Diagnosis not present

## 2020-03-23 DIAGNOSIS — E1149 Type 2 diabetes mellitus with other diabetic neurological complication: Secondary | ICD-10-CM | POA: Diagnosis not present

## 2020-03-23 DIAGNOSIS — D631 Anemia in chronic kidney disease: Secondary | ICD-10-CM | POA: Diagnosis not present

## 2020-03-23 DIAGNOSIS — M109 Gout, unspecified: Secondary | ICD-10-CM | POA: Diagnosis not present

## 2020-03-23 DIAGNOSIS — I129 Hypertensive chronic kidney disease with stage 1 through stage 4 chronic kidney disease, or unspecified chronic kidney disease: Secondary | ICD-10-CM | POA: Diagnosis not present

## 2020-03-23 DIAGNOSIS — E889 Metabolic disorder, unspecified: Secondary | ICD-10-CM | POA: Diagnosis not present

## 2020-03-23 DIAGNOSIS — N183 Chronic kidney disease, stage 3 unspecified: Secondary | ICD-10-CM | POA: Diagnosis not present

## 2020-03-23 DIAGNOSIS — E1165 Type 2 diabetes mellitus with hyperglycemia: Secondary | ICD-10-CM | POA: Diagnosis not present

## 2020-03-24 ENCOUNTER — Telehealth (INDEPENDENT_AMBULATORY_CARE_PROVIDER_SITE_OTHER): Payer: Medicare Other | Admitting: Cardiology

## 2020-03-24 ENCOUNTER — Encounter: Payer: Self-pay | Admitting: Cardiology

## 2020-03-24 VITALS — BP 122/63 | HR 68 | Ht 67.0 in | Wt 191.0 lb

## 2020-03-24 DIAGNOSIS — Z794 Long term (current) use of insulin: Secondary | ICD-10-CM | POA: Diagnosis not present

## 2020-03-24 DIAGNOSIS — Z8719 Personal history of other diseases of the digestive system: Secondary | ICD-10-CM | POA: Diagnosis not present

## 2020-03-24 DIAGNOSIS — N184 Chronic kidney disease, stage 4 (severe): Secondary | ICD-10-CM

## 2020-03-24 DIAGNOSIS — Z9889 Other specified postprocedural states: Secondary | ICD-10-CM | POA: Diagnosis not present

## 2020-03-24 DIAGNOSIS — E782 Mixed hyperlipidemia: Secondary | ICD-10-CM | POA: Diagnosis not present

## 2020-03-24 DIAGNOSIS — I480 Paroxysmal atrial fibrillation: Secondary | ICD-10-CM

## 2020-03-24 DIAGNOSIS — I119 Hypertensive heart disease without heart failure: Secondary | ICD-10-CM | POA: Diagnosis not present

## 2020-03-24 DIAGNOSIS — I6523 Occlusion and stenosis of bilateral carotid arteries: Secondary | ICD-10-CM | POA: Diagnosis not present

## 2020-03-24 DIAGNOSIS — I2510 Atherosclerotic heart disease of native coronary artery without angina pectoris: Secondary | ICD-10-CM | POA: Diagnosis not present

## 2020-03-24 DIAGNOSIS — E119 Type 2 diabetes mellitus without complications: Secondary | ICD-10-CM | POA: Diagnosis not present

## 2020-03-24 MED ORDER — HYDRALAZINE HCL 25 MG PO TABS: 25.0000 mg | ORAL_TABLET | Freq: Two times a day (BID) | ORAL | 3 refills | Status: DC

## 2020-03-24 NOTE — Progress Notes (Signed)
Virtual Visit via Telephone Note   This visit type was conducted due to national recommendations for restrictions regarding the COVID-19 Pandemic (e.g. social distancing) in an effort to limit this patient's exposure and mitigate transmission in our community.  Due to his co-morbid illnesses, this patient is at least at moderate risk for complications without adequate follow up.  This format is felt to be most appropriate for this patient at this time.  The patient did not have access to video technology/had technical difficulties with video requiring transitioning to audio format only (telephone).  All issues noted in this document were discussed and addressed.  No physical exam could be performed with this format.  Please refer to the patient's chart for his  consent to telehealth for [facility].   The patient was identified using 2 identifiers.  Date:  03/24/2020   ID:  LANNIS LICHTENWALNER, DOB 1932/01/18, MRN [id number]  Patient Location: Home Provider Location: Home  PCP:  Tonia Ghent, MD  Cardiologist:  Quay Burow, MD  Electrophysiologist:  None   Evaluation Performed:  Follow-Up Visit  Chief Complaint:  none  History of Present Illness:    JAISEN WILTROUT is a 86 y.o. male with an extensive history of CAD, PAF on low dose Eliquis, severe MR, and CRI-4. His complete history is outlined in previous notes. In March 2021 he underwent MV clipping with good results.  He was seen in the Valve clinic 03/12/2020 and was doing well, f/u scheduled for one year.  He was contacted today for routine f/u with Dr Gwenlyn Found.   The patient has been quite pleased with his progress after his MV repair.  He has been able to increase his walking.  He denies any chest pain or NTG use.  He is followed at [facility] and they saw him yesterday, hydralazine was added for B/P control.   The patient does not have symptoms concerning for COVID-19 infection (fever, chills, cough, or new shortness of  breath).    Past Medical History:  Diagnosis Date  . Age-related macular degeneration, wet, both eyes ([place])   . Anemia    Secondary to acute blood loss  . Anginal pain ([place])    last chest pain in Feb 2021  . Arthritis    "mild in hands, knees, ankles" (11/13/2015)  . Atrial fibrillation ([place])    Consideration was given for atrial flutter ablation, but patient developed atrial fibrillation. Cardioversion was done. Dr. Caryl Comes decided to watch him clinically. November, 2011  . Atrial flutter [facility]) 07/2010   September, 2011   Hospital with PNA and cath done.Marland KitchenMarland KitchenCoumadin.  Atrial flutter ablation planned, but  pt. then had atrial fibrillation,/outpatient conversion 09/08/10..NSR..plan to follow..Dr. Caryl Comes  . CAD (coronary artery disease)    Catheterization, September, 2011,  grafts patent from redo CABG,, medical therapy of coronary disease, consideration to proceeding with atrial flutter ablation  . Carotid artery disease ([place])    Doppler 09/18/2009 - 49% bilateral stenoses  . Carotid artery disease ([name])    49% bilateral, Doppler, November, 2010  . CHF (congestive heart failure) ([place])   . Chronic kidney disease (CKD), stage III (moderate)   . Diabetic peripheral neuropathy ([name])    feet  . Diverticulosis of colon with hemorrhage 2009   several unit diverticular bleed   . Erectile dysfunction    Mild  . Gout   . Hearing loss    wears hearing aids  . History of blood transfusion "several"   related  to diverticular bleeding  . Hyperlipidemia   . Hypertension   . Mitral regurgitation    Mild, echo, September, 2011  . NSTEMI (non-ST elevated myocardial infarction) ([place]) 07/2010   at [facility] with repeat cath, rec medical mgmt   . OSA on CPAP    uses CPAP nightly  . Personal history of colonic polyps   . PNA (pneumonia) 9/11   NSTEMI at [facility] with repeat cath, rec medical mgmt   . RBBB (right bundle branch block)   . S/P mitral valve repair 02/06/2020   s/p TEER with a single MitraClip NTW  placed on A2/P2  . Shoulder pain    "positional; better now" (11/13/2015)  . Skin cancer    R lower leg, per derm 2012  . Type II diabetes mellitus ([place])    Past Surgical History:  Procedure Laterality Date  . CARDIAC CATHETERIZATION  2006   Nuclear..slight lateral ischemia..medical therapy  . CARDIAC CATHETERIZATION  08/04/2010   grafts patent from redo CABG...medical Rx and ablate Atrial flutter (LV not injected)   . CARDIOVERSION  ~ 2010  . CAROTID ENDARTERECTOMY Right 1994  . CATARACT EXTRACTION W/ INTRAOCULAR LENS  IMPLANT, BILATERAL Bilateral   . COLON RESECTION N/A 01/13/2016   Procedure: EXPLORATORY LAPAROTOMY, LEFT AND SIGMOID COLON REMOVAL;  Surgeon: Ralene Ok, MD;  Location: [place];  Service: General;  Laterality: N/A;  Extended open left hemicolectomy and sigmoidectomy   . COLONOSCOPY N/A 11/14/2015   Procedure: COLONOSCOPY;  Surgeon: Manus Gunning, MD;  Location: [facility] ENDOSCOPY;  Service: Gastroenterology;  Laterality: N/A;  . COLONOSCOPY Left 01/05/2016   Procedure: COLONOSCOPY;  Surgeon: Manus Gunning, MD;  Location: [place];  Service: Gastroenterology;  Laterality: Left;  no sedation to start, moderate if needed  . COLONOSCOPY W/ POLYPECTOMY    . CORONARY ARTERY BYPASS GRAFT  1995; 2006   "X 3; X3"  . [place], 2006  . DOPPLER ECHOCARDIOGRAPHY  08/2008   EF 60%  . DOPPLER ECHOCARDIOGRAPHY  08/02/2010   65-70%  . DOPPLER ECHOCARDIOGRAPHY  07/2010   MR mild  . ESOPHAGOGASTRODUODENOSCOPY N/A 06/19/2014   Procedure: ESOPHAGOGASTRODUODENOSCOPY (EGD);  Surgeon: Jerene Bears, MD;  Location: [facility] ENDOSCOPY;  Service: Endoscopy;  Laterality: N/A;  . ESOPHAGOGASTRODUODENOSCOPY N/A 11/14/2015   Procedure: ESOPHAGOGASTRODUODENOSCOPY (EGD);  Surgeon: Manus Gunning, MD;  Location: [place];  Service: Gastroenterology;  Laterality: N/A;  . FLEXIBLE SIGMOIDOSCOPY N/A 11/16/2015   Procedure: FLEXIBLE SIGMOIDOSCOPY;  Surgeon:  Manus Gunning, MD;  Location: [place];  Service: Gastroenterology;  Laterality: N/A;  . LAPAROSCOPIC CHOLECYSTECTOMY  2008  . LEFT HEART CATH N/A 06/14/2014   Procedure: LEFT HEART CATH;  Surgeon: Sinclair Grooms, MD;  Location: [facility] CATH LAB;  Service: Cardiovascular;  Laterality: N/A;  . LEFT HEART CATH AND CORONARY ANGIOGRAPHY N/A 08/02/2018   Procedure: LEFT HEART CATH AND CORONARY ANGIOGRAPHY;  Surgeon: [name], MD;  Location: [place] CV LAB;  Service: Cardiovascular;  Laterality: N/A;  . LEFT HEART CATHETERIZATION WITH CORONARY/GRAFT ANGIOGRAM N/A 06/11/2014   Procedure: LEFT HEART CATHETERIZATION WITH Beatrix Fetters;  Surgeon: Sinclair Grooms, MD;  Location: [facility] CATH LAB;  Service: Cardiovascular;  Laterality: N/A;  . MITRAL VALVE REPAIR N/A 02/06/2020   Procedure: MITRAL VALVE REPAIR;  Surgeon: Sherren Mocha, MD;  Location: [place] CV LAB;  Service: Cardiovascular;  Laterality: N/A;  . PERCUTANEOUS CORONARY STENT INTERVENTION (PCI-S) N/A 06/13/2014   Procedure: PERCUTANEOUS CORONARY STENT INTERVENTION (PCI-S);  Surgeon: Sinclair Grooms, MD;  Location: [facility] CATH LAB;  Service: Cardiovascular;  Laterality: N/A;  . RIGHT HEART CATH N/A 01/06/2020   Procedure: RIGHT HEART CATH;  Surgeon: Jolaine Artist, MD;  Location: [place] CV LAB;  Service: Cardiovascular;  Laterality: N/A;  . SKIN CANCER EXCISION Left 10/2015   calf  . SKIN CANCER EXCISION Right 2014?   chest  . TEE WITHOUT CARDIOVERSION N/A 01/06/2020   Procedure: TRANSESOPHAGEAL ECHOCARDIOGRAM (TEE);  Surgeon: Jolaine Artist, MD;  Location: [facility] ENDOSCOPY;  Service: Cardiovascular;  Laterality: N/A;  . TONSILLECTOMY    . VASECTOMY       Current Meds  Medication Sig  . amoxicillin (AMOXIL) 500 MG capsule Take 4 tablets (2000 mg) by mouth 60 min prior to any dental procedures  . aspirin 81 MG tablet Take 81 mg by mouth daily.  . BD INSULIN SYRINGE ULTRAFINE 31G X 5/16" 0.3 ML MISC  USE DAILY AS INSTRUCTED  . cetirizine (ZYRTEC) 10 MG tablet Take 10 mg by mouth daily as needed for allergies.  . Cholecalciferol (VITAMIN D) 1000 UNITS capsule Take 1,000 Units by mouth daily.   . colchicine 0.6 MG tablet Take 1 tablet (0.6 mg total) by mouth daily as needed (Fri, Sat, Sun, Mon).  [name] ELIQUIS 2.5 MG TABS tablet TAKE 1 TABLET TWICE A DAY  . fish oil-omega-3 fatty acids 1000 MG capsule Take 1 g by mouth daily.   . furosemide (LASIX) 80 MG tablet Take 1 tablet (80 mg total) by mouth 2 (two) times daily.  [name] glucose blood (FREESTYLE LITE) test strip USE TO TEST BLOOD SUGAR ONCE DAILY AND AS NEEDED FOR DIABETES MELLITUS  . isosorbide mononitrate (IMDUR) 60 MG 24 hr tablet TAKE ONE AND ONE-HALF TABLETS (90 MG) IN THE MORNING AND 1 TABLET (60 MG) IN THE EVENING  (NEW INSTRUCTIONS)  . LANTUS 100 UNIT/ML injection INJECT 20 TO 25 UNITS AT BEDTIME  . magnesium oxide (MAG-OX) 400 MG tablet Take 400 mg by mouth daily.   . metoprolol tartrate (LOPRESSOR) 50 MG tablet TAKE 1 TABLET TWICE A DAY  . Multiple Vitamin (MULTIVITAMIN WITH MINERALS) TABS Take 1 tablet by mouth daily.  . nitroGLYCERIN (NITROSTAT) 0.4 MG SL tablet Place 1 tablet (0.4 mg total) under the tongue every 5 (five) minutes as needed for chest pain.  [name] Potassium Gluconate (K-99) 595 MG CAPS Take 595 mg by mouth daily.   . ranolazine (RANEXA) 500 MG 12 hr tablet TAKE 1 TABLET TWICE A DAY  . rosuvastatin (CRESTOR) 40 MG tablet Take 1 tablet (40 mg total) by mouth at bedtime.     Allergies:   Patient has no known allergies.   Social History   Tobacco Use  . Smoking status: Former Smoker    Packs/day: 1.00    Years: 8.00    Pack years: 8.00    Types: Cigarettes    Quit date: 1958    Years since quitting: 63.4  . Smokeless tobacco: Never Used  . Tobacco comment: "quit smoking cigarettes in 1958"  Substance Use Topics  . Alcohol use: No    Alcohol/week: 0.0 standard drinks  . Drug use: No     Family Hx: The  patient's family history includes Arthritis in his sister; Cancer in his sister; Diabetes in his mother and sister; Heart disease in his father and sister; Kidney disease in his mother; Stroke in his mother. There is no history of Prostate cancer or Colon cancer.  ROS:  Please see the history of present illness.    All other systems reviewed and are negative.   Prior CV studies:   The following studies were reviewed today:  Echo 03/12/2020- IMPRESSIONS    1. Posterior lateral and inferior basal wall hypokinesis . Left  ventricular ejection fraction, by estimation, is 45 to 50%. The left  ventricle has mildly decreased function. The left ventricle demonstrates  regional wall motion abnormalities (see scoring  diagram/findings for description). Left ventricular diastolic parameters  are indeterminate.  2. Right ventricular systolic function is normal. The right ventricular  size is normal. There is moderately elevated pulmonary artery systolic  pressure.  3. Left atrial size was moderately dilated.  4. There is significant shunting seen at site of trans septal puncture  for mitral clip proceedure It is mostly left to right but is bidirectional  with continous dopple signal noted . Evidence of atrial level shunting  detected by color flow Doppler.  5. Post Mitral clip A2P2 edge to edge repair with single NTW device.  Trival residual MR with mean gradient now 6 mmHg compared to 4 mmHg  implant MVA 2.4 cm2. The mitral valve has been repaired/replaced. Trivial  mitral valve regurgitation. No evidence of  mitral stenosis. There is a Mitra-Clip present in the mitral position.  Procedure Date: 02/06/2020.  6. The aortic valve is tricuspid. Aortic valve regurgitation is not  visualized. Severe sclerosis with no stenosis.  7. The inferior vena cava is dilated in size with >50% respiratory  variability, suggesting right atrial pressure of 8 mmHg.   Comparison(s): 02/07/20 EF 45-50%.  PA pressure 13mmHg. Mild MR. MV 34mmHg  mean PG.    Labs/Other Tests and Data Reviewed:    EKG:  An ECG dated 02/07/2020 was personally reviewed today and demonstrated:  NSR- RBBB, LAFB  Recent Labs: 02/03/2020: ALT 22; B Natriuretic Peptide 437.4 02/07/2020: BUN 31; Creatinine, Ser 2.30; Hemoglobin 11.8; Platelets 114; Potassium 4.1; Sodium 139   Recent Lipid Panel Lab Results  Component Value Date/Time   CHOL 102 06/21/2019 11:37 AM   TRIG 106.0 06/21/2019 11:37 AM   HDL 25.20 (L) 06/21/2019 11:37 AM   CHOLHDL 4 06/21/2019 11:37 AM   LDLCALC 56 06/21/2019 11:37 AM    Wt Readings from Last 3 Encounters:  03/24/20 191 lb (86.6 kg)  03/12/20 190 lb (86.2 kg)  02/13/20 188 lb 9.6 oz (85.5 kg)     Objective:    Vital Signs:  BP 122/63   Pulse 68   Ht 5\' 7"  (1.702 m)   Wt 191 lb (86.6 kg)   BMI 29.91 kg/m    VITAL SIGNS:  reviewed  ASSESSMENT & PLAN:    Severe MR- S/P MV repair with clip March 2021- good results.  CAD- CABG in '96 with re do in '06.  S/P SVG-OM DES Aug 2015 Cath Sept 2019- progression of CAD- plan aggressive medical Rx.  PAF- Last EKG shows NSR March 2021  CRI-4 GFR 25, followed at [facility]  HTN- Hydralazine added by nephrologist  Chronic anticoagulation- Low dose Eliquis based on age and renal function.  H/O GI bleeds- S/P hemicolectomy in 0258- complicated by NSTEMI  PVD- H/O RCE 1994  IDDM- Per PCP  HLD- On high dose statin Rx-LDL 56 Aug 2020  Plan: Same cardiac Rx.  He'll need SBE prophylaxis prior to any procedures.  F/U Dr Gwenlyn Found in 6 months.   COVID-19 Education: The signs and symptoms of COVID-19 were discussed with the patient and  how to seek care for testing (follow up with PCP or arrange E-visit).  The importance of social distancing was discussed today.  Time:   Today, I have spent 15 minutes with the patient with telehealth technology discussing the above problems.     Medication Adjustments/Labs and Tests  Ordered: Current medicines are reviewed at length with the patient today.  Concerns regarding medicines are outlined above.   Tests Ordered: No orders of the defined types were placed in this encounter.   Medication Changes: No orders of the defined types were placed in this encounter.   Follow Up:  In Person with Dr Gwenlyn Found (no APP) in 6 months.   Signed, Kerin Ransom, PA-C  03/24/2020 1:42 PM    [facility]

## 2020-03-24 NOTE — Patient Instructions (Signed)
Medication Instructions:  Your physician recommends that you continue on your current medications as directed. Please refer to the Current Medication list given to you today.  *If you need a refill on your cardiac medications before your next appointment, please call your pharmacy*   Follow-Up: At [facility], you and your health needs are our priority.  As part of our continuing mission to provide you with exceptional heart care, we have created designated Provider Care Teams.  These Care Teams include your primary Cardiologist (physician) and Advanced Practice Providers (APPs -  Physician Assistants and Nurse Practitioners) who all work together to provide you with the care you need, when you need it.  We recommend signing up for the patient portal called "MyChart".  Sign up information is provided on this After Visit Summary.  MyChart is used to connect with patients for Virtual Visits (Telemedicine).  Patients are able to view lab/test results, encounter notes, upcoming appointments, etc.  Non-urgent messages can be sent to your provider as well.   To learn more about what you can do with MyChart, go to [email or web address].    Your next appointment:   6 month(s)  The format for your next appointment:   In Person  Provider:   Quay Burow, MD (ONLY)    Other Instructions Please call our office 2 months in advance to schedule your follow-up appointment with Dr. Gwenlyn Found.

## 2020-03-25 DIAGNOSIS — Z08 Encounter for follow-up examination after completed treatment for malignant neoplasm: Secondary | ICD-10-CM | POA: Diagnosis not present

## 2020-03-25 DIAGNOSIS — Z86006 Personal history of melanoma in-situ: Secondary | ICD-10-CM | POA: Diagnosis not present

## 2020-03-25 DIAGNOSIS — L570 Actinic keratosis: Secondary | ICD-10-CM | POA: Diagnosis not present

## 2020-03-25 DIAGNOSIS — Z1283 Encounter for screening for malignant neoplasm of skin: Secondary | ICD-10-CM | POA: Diagnosis not present

## 2020-03-25 DIAGNOSIS — D044 Carcinoma in situ of skin of scalp and neck: Secondary | ICD-10-CM | POA: Diagnosis not present

## 2020-03-25 DIAGNOSIS — X32XXXD Exposure to sunlight, subsequent encounter: Secondary | ICD-10-CM | POA: Diagnosis not present

## 2020-03-25 DIAGNOSIS — L821 Other seborrheic keratosis: Secondary | ICD-10-CM | POA: Diagnosis not present

## 2020-05-11 ENCOUNTER — Telehealth: Payer: Self-pay | Admitting: Family Medicine

## 2020-05-11 ENCOUNTER — Other Ambulatory Visit: Payer: Self-pay | Admitting: *Deleted

## 2020-05-11 NOTE — Telephone Encounter (Signed)
Patient call stating that [facility] called to say they needed a new Rx for his Colchicine. Last office visit:   06/25/2019 Last Filled:   11/30/2015 Please advise.

## 2020-05-11 NOTE — Telephone Encounter (Signed)
Patient called today.   He stated that [facility] out to him and stated they will need a new scripts for his colchicine 0.6 MG tablet

## 2020-05-11 NOTE — Telephone Encounter (Signed)
Refill request submitted to Dr. Damita Dunnings for approval.

## 2020-05-12 NOTE — Telephone Encounter (Signed)
Please verify current use rate with patient and I will work on the prescription.  Thanks.

## 2020-05-12 NOTE — Telephone Encounter (Signed)
Patient says he, previously, had been taking the Colchicine, 2 tablets each weekend.  He had not needed it filled in a long time because the pharmacy was filling it routinely and he had a stockpile of the medication.  Lately, he had tried not taking it at all and then began having a flare up so he now takes it 4 times a week (basically every other day).

## 2020-05-13 MED ORDER — COLCHICINE 0.6 MG PO TABS: 0.6000 mg | ORAL_TABLET | ORAL | Status: DC

## 2020-05-13 NOTE — Telephone Encounter (Signed)
Please update patient.  I did some extra checking on this and I got new information from pharmacy about a potential interaction with colchicine and ranexa.  We may have to change his medication or dose.  I need cardiology input on this, about his ranexa.    I routed this to cardiology for input (Dr. Gwenlyn Found).  If patient is going to need to stay on ranexa, we may have to change his plan with colchicine, ie potentially changing to a different preventive med with possible low dose of prednisone (with acknowledgement of DM2) in case of a flare.  I appreciate cards input.

## 2020-05-14 NOTE — Telephone Encounter (Signed)
Patient advised and will await further notice.

## 2020-05-21 ENCOUNTER — Other Ambulatory Visit: Payer: Self-pay | Admitting: Cardiovascular Disease

## 2020-05-26 NOTE — Addendum Note (Signed)
Addended by: Tonia Ghent on: 05/26/2020 10:30 AM   Modules accepted: Orders

## 2020-05-26 NOTE — Telephone Encounter (Addendum)
I have tried to get update from Dr. Gwenlyn Found about this patient.  In the meantime, ranexa refill was sent by Dr. Gwenlyn Found.    Based on the available data and ongoing ranexa use, it would be reasonable to stop colchicine and use allopurinol 50mg  on MWF as a preventive medication.  I think that is the best option at this point.  If patient is willing to start, then let me know and I'll send the rx.  I pended the medication and updated his med list in the meantime.  Thanks.

## 2020-05-27 MED ORDER — ALLOPURINOL 100 MG PO TABS: 50.0000 mg | ORAL_TABLET | ORAL | 3 refills | Status: DC

## 2020-05-27 NOTE — Telephone Encounter (Signed)
Sent. Thanks.   

## 2020-05-27 NOTE — Addendum Note (Signed)
Addended by: Tonia Ghent on: 05/27/2020 02:33 PM   Modules accepted: Orders

## 2020-05-27 NOTE — Telephone Encounter (Signed)
Patient notified as instructed by telephone and verbalized understanding. Patient stated that he is willing to start the medication. Patient stated that he would like the script sent to [facility].

## 2020-06-24 ENCOUNTER — Other Ambulatory Visit: Payer: Self-pay

## 2020-06-24 ENCOUNTER — Ambulatory Visit (HOSPITAL_COMMUNITY)
Admission: RE | Admit: 2020-06-24 | Discharge: 2020-06-24 | Disposition: A | Discharge status: Home / Self Care | Payer: Medicare Other | Source: Ambulatory Visit | Attending: Internal Medicine | Admitting: Internal Medicine

## 2020-06-24 ENCOUNTER — Encounter (HOSPITAL_COMMUNITY): Payer: Self-pay | Admitting: Internal Medicine

## 2020-06-24 VITALS — BP 162/80 | HR 80 | Wt 193.2 lb

## 2020-06-24 DIAGNOSIS — I25810 Atherosclerosis of coronary artery bypass graft(s) without angina pectoris: Secondary | ICD-10-CM | POA: Diagnosis not present

## 2020-06-24 DIAGNOSIS — Z87891 Personal history of nicotine dependence: Secondary | ICD-10-CM | POA: Diagnosis not present

## 2020-06-24 DIAGNOSIS — M109 Gout, unspecified: Secondary | ICD-10-CM | POA: Insufficient documentation

## 2020-06-24 DIAGNOSIS — E1122 Type 2 diabetes mellitus with diabetic chronic kidney disease: Secondary | ICD-10-CM | POA: Diagnosis not present

## 2020-06-24 DIAGNOSIS — I2510 Atherosclerotic heart disease of native coronary artery without angina pectoris: Secondary | ICD-10-CM | POA: Diagnosis not present

## 2020-06-24 DIAGNOSIS — I272 Pulmonary hypertension, unspecified: Secondary | ICD-10-CM

## 2020-06-24 DIAGNOSIS — Z833 Family history of diabetes mellitus: Secondary | ICD-10-CM | POA: Insufficient documentation

## 2020-06-24 DIAGNOSIS — Z9049 Acquired absence of other specified parts of digestive tract: Secondary | ICD-10-CM | POA: Diagnosis not present

## 2020-06-24 DIAGNOSIS — Z8249 Family history of ischemic heart disease and other diseases of the circulatory system: Secondary | ICD-10-CM | POA: Diagnosis not present

## 2020-06-24 DIAGNOSIS — I080 Rheumatic disorders of both mitral and aortic valves: Secondary | ICD-10-CM | POA: Insufficient documentation

## 2020-06-24 DIAGNOSIS — Z7982 Long term (current) use of aspirin: Secondary | ICD-10-CM | POA: Diagnosis not present

## 2020-06-24 DIAGNOSIS — I482 Chronic atrial fibrillation, unspecified: Secondary | ICD-10-CM | POA: Insufficient documentation

## 2020-06-24 DIAGNOSIS — N184 Chronic kidney disease, stage 4 (severe): Secondary | ICD-10-CM | POA: Diagnosis not present

## 2020-06-24 DIAGNOSIS — I130 Hypertensive heart and chronic kidney disease with heart failure and stage 1 through stage 4 chronic kidney disease, or unspecified chronic kidney disease: Secondary | ICD-10-CM | POA: Insufficient documentation

## 2020-06-24 DIAGNOSIS — Z79899 Other long term (current) drug therapy: Secondary | ICD-10-CM | POA: Insufficient documentation

## 2020-06-24 DIAGNOSIS — E785 Hyperlipidemia, unspecified: Secondary | ICD-10-CM | POA: Insufficient documentation

## 2020-06-24 DIAGNOSIS — E1142 Type 2 diabetes mellitus with diabetic polyneuropathy: Secondary | ICD-10-CM | POA: Diagnosis not present

## 2020-06-24 DIAGNOSIS — Z7901 Long term (current) use of anticoagulants: Secondary | ICD-10-CM | POA: Diagnosis not present

## 2020-06-24 DIAGNOSIS — Z8719 Personal history of other diseases of the digestive system: Secondary | ICD-10-CM | POA: Insufficient documentation

## 2020-06-24 DIAGNOSIS — I255 Ischemic cardiomyopathy: Secondary | ICD-10-CM | POA: Diagnosis not present

## 2020-06-24 DIAGNOSIS — I252 Old myocardial infarction: Secondary | ICD-10-CM | POA: Diagnosis not present

## 2020-06-24 DIAGNOSIS — G4733 Obstructive sleep apnea (adult) (pediatric): Secondary | ICD-10-CM | POA: Insufficient documentation

## 2020-06-24 DIAGNOSIS — Z794 Long term (current) use of insulin: Secondary | ICD-10-CM | POA: Diagnosis not present

## 2020-06-24 MED ORDER — FUROSEMIDE 80 MG PO TABS: 80.0000 mg | ORAL_TABLET | Freq: Every day | ORAL | 5 refills | Status: DC

## 2020-06-24 MED ORDER — EMPAGLIFLOZIN 10 MG PO TABS: 10.0000 mg | ORAL_TABLET | Freq: Every day | ORAL | 6 refills | Status: DC

## 2020-06-24 NOTE — Progress Notes (Signed)
ADVANCED HF CLINIC CONSULT NOTE  Referring Physician: Gwenlyn Found Primary Cardiologist: Gwenlyn Found  HPI:  [name] is an 86 y.o. male with h/o CAD s/p re-do CABG ([id number]), carotid disease s/p CEA, HTN, HLN, chronic AF, chronic GI bleeding s/p left hemicolectomy. He is referred by Dr. Gwenlyn Found for evaluation of ongoing dyspnea.   He has had several caths over the past 5 years with stenting of SVG to OM on 2015. Last cath 9/19 newly occluded SVG to RCA and high-grade OM1 lesion which was treated medically due to unfavorable anatomy.   I saw him for the first time in 1/21 and I felt MR was the major issue.   Eventually underwent RHC and TEE and followed by MitraClip (02/06/20)   RA = 10 RV = 58/8 PA = 58/19 (33) PCW = 20 (v waves to 35)  Fick cardiac output/index = 4.3/2.1 Thermo CO/CI = 3.2/1.7 PVR = 3.1 (Fick) 4.0 (thermo) Ao sat = 99% PA sat = 64%, 68%   Here for f/u. Feeling better but still gets tired easily. Can get to mailbox and back which is 438ft but then gets tired. No orthopnea or PND. + LE edema. Taking lasix 80 daily and takes the second 80 in the afternoon only about 2x/week (was supposed to be bid). Says he felt his BP was too low last week (110/65) and was dizzy so stopped hydralazine. Blood sugars averaging 180-200.   Echo 4/21: EF 45-50%, s/p A2P2 edge to edge repair with single NTW device. Trivial residual MR and mean gradient of 6 mm Hg. There is significant bidirectional shunting at transeptal puncture.   REDS today 51%   Echo 8/20  1. LVEF 40-45% akinesis of the mid and apical inferior walls and hypokinesis of the inferolateral walls consistent with prior infarct in the RCA or left Cx territory.  2. The right ventricle has moderately reduced systolic function. The cavity was normal. There is no increase in right ventricular wall thickness. Right ventricular systolic pressure is severely elevated at 51mmHg .  3. Moderate MR  4. The aortic valve has an  indeterminate number of cusps. Moderate thickening of the aortic valve. Severe calcifcation of the aortic valve. Aortic valve regurgitation is mild by color flow Doppler.   Past Medical History:  Diagnosis Date   Age-related macular degeneration, wet, both eyes ([place])    Anemia    Secondary to acute blood loss   Anginal pain ([name])    last chest pain in Feb 2021   Arthritis    "mild in hands, knees, ankles" (11/13/2015)   Atrial fibrillation ([place])    Consideration was given for atrial flutter ablation, but patient developed atrial fibrillation. Cardioversion was done. Dr. Caryl Comes decided to watch him clinically. November, 2011   Atrial flutter [facility]) 07/2010   September, 2011   Hospital with PNA and cath done.Marland KitchenMarland KitchenCoumadin.  Atrial flutter ablation planned, but  pt. then had atrial fibrillation,/outpatient conversion 09/08/10..NSR..plan to follow..Dr. Caryl Comes   CAD (coronary artery disease)    Catheterization, September, 2011,  grafts patent from redo CABG,, medical therapy of coronary disease, consideration to proceeding with atrial flutter ablation   Carotid artery disease ([place])    Doppler 09/18/2009 - 49% bilateral stenoses   Carotid artery disease ([place])    49% bilateral, Doppler, November, 2010   CHF (congestive heart failure) ([place])    Chronic kidney disease (CKD), stage III (moderate)    Diabetic peripheral neuropathy ([place])    feet   Diverticulosis of colon  with hemorrhage 2009   several unit diverticular bleed    Erectile dysfunction    Mild   Gout    Hearing loss    wears hearing aids   History of blood transfusion "several"   related to diverticular bleeding   Hyperlipidemia    Hypertension    Mitral regurgitation    Mild, echo, September, 2011   NSTEMI (non-ST elevated myocardial infarction) ([place]) 07/2010   at [facility] with repeat cath, rec medical mgmt    OSA on CPAP    uses CPAP nightly   Personal history of colonic polyps    PNA (pneumonia) 9/11   NSTEMI  at [facility] with repeat cath, rec medical mgmt    RBBB (right bundle branch block)    S/P mitral valve repair 02/06/2020   s/p TEER with a single MitraClip NTW placed on A2/P2   Shoulder pain    "positional; better now" (11/13/2015)   Skin cancer    R lower leg, per derm 2012   Type II diabetes mellitus ([name])     Current Outpatient Medications  Medication Sig Dispense Refill   allopurinol (ZYLOPRIM) 100 MG tablet Take 0.5 tablets (50 mg total) by mouth every Monday, Wednesday, and Friday. 30 tablet 3   amoxicillin (AMOXIL) 500 MG capsule Take 4 tablets (2000 mg) by mouth 60 min prior to any dental procedures 8 capsule 2   aspirin 81 MG tablet Take 81 mg by mouth daily.     BD INSULIN SYRINGE ULTRAFINE 31G X 5/16" 0.3 ML MISC USE DAILY AS INSTRUCTED 100 each 2   cetirizine (ZYRTEC) 10 MG tablet Take 10 mg by mouth daily as needed for allergies.     Cholecalciferol (VITAMIN D) 1000 UNITS capsule Take 1,000 Units by mouth daily.      ELIQUIS 2.5 MG TABS tablet TAKE 1 TABLET TWICE A DAY 180 tablet 3   fish oil-omega-3 fatty acids 1000 MG capsule Take 1 g by mouth daily.      furosemide (LASIX) 80 MG tablet Take 1 tablet (80 mg total) by mouth 2 (two) times daily. 60 tablet 5   glucose blood (FREESTYLE LITE) test strip USE TO TEST BLOOD SUGAR ONCE DAILY AND AS NEEDED FOR DIABETES MELLITUS 300 each 3   hydrALAZINE (APRESOLINE) 25 MG tablet Take 1 tablet (25 mg total) by mouth 2 (two) times daily. 180 tablet 3   isosorbide mononitrate (IMDUR) 60 MG 24 hr tablet TAKE ONE AND ONE-HALF TABLETS (90 MG) IN THE MORNING AND 1 TABLET (60 MG) IN THE EVENING  (NEW INSTRUCTIONS) 270 tablet 3   LANTUS 100 UNIT/ML injection INJECT 20 TO 25 UNITS AT BEDTIME 40 mL 3   magnesium oxide (MAG-OX) 400 MG tablet Take 400 mg by mouth daily.      metoprolol tartrate (LOPRESSOR) 50 MG tablet TAKE 1 TABLET TWICE A DAY 180 tablet 1   Multiple Vitamin (MULTIVITAMIN WITH MINERALS) TABS Take 1 tablet by  mouth daily.     nitroGLYCERIN (NITROSTAT) 0.4 MG SL tablet Place 1 tablet (0.4 mg total) under the tongue every 5 (five) minutes as needed for chest pain. 100 tablet 1   Potassium Gluconate (K-99) 595 MG CAPS Take 595 mg by mouth daily.      ranolazine (RANEXA) 500 MG 12 hr tablet TAKE 1 TABLET TWICE A DAY 180 tablet 3   rosuvastatin (CRESTOR) 40 MG tablet Take 1 tablet (40 mg total) by mouth at bedtime. 90 tablet 3   No current facility-administered medications  for this encounter.    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     Lives with girlfriend [name].  Widowed 12/2005.   Social Determinants of Health   Financial Resource Strain:    Difficulty of Paying Living Expenses:   Food Insecurity:    Worried About Charity fundraiser in the Last Year:    Arboriculturist in the Last Year:   Transportation Needs:    Film/video editor (Medical):    Lack of Transportation (Non-Medical):   Physical Activity:    Days of Exercise per Week:    Minutes of Exercise per Session:   Stress:    Feeling of Stress :   Social  Connections:    Frequency of Communication with Friends and Family:    Frequency of Social Gatherings with Friends and Family:    Attends Religious Services:    Active Member of Clubs or Organizations:    Attends Music therapist:    Marital Status:   Intimate Partner Violence:    Fear of Current or Ex-Partner:    Emotionally Abused:    Physically Abused:    Sexually Abused:       Family History  Problem Relation Age of Onset   Kidney disease Mother        Kidney failure   Stroke Mother    Diabetes Mother    Heart disease Father        MI   Arthritis Sister    Cancer Sister        Throat   Heart disease Sister        MI   Diabetes Sister    Prostate cancer Neg Hx    Colon cancer Neg Hx     Vitals:   06/24/20 1339  BP: (!) 162/80  Pulse: 80  SpO2: 97%  Weight: 87.6 kg (193 lb 3.2 oz)    PHYSICAL EXAM: General:  Elderly Well appearing. No resp difficulty HEENT: normal Neck: supple. JVP 8-9 Carotids 2+ bilat; no bruits. No lymphadenopathy or thryomegaly appreciated. Cor: PMI nondisplaced. Irregular rate & rhythm. 2/6 SEM RUSB Lungs: clear Abdomen: obese soft, nontender, + distended. No hepatosplenomegaly. No bruits or masses. Good bowel sounds. Extremities: no cyanosis, clubbing, rash, 2+ edema Neuro: alert & orientedx3, cranial nerves grossly intact. moves all 4 extremities w/o difficulty. Affect pleasant   ReDS 32%-> 45% -> 51%  ASSESSMENT & PLAN:  1.  Chronic systolic HF due to ischemic CM  - EF 40-45% echo 8/20 3+ MR - s/p MitraClip 3/21 - Echo 4/21: EF 45-50%, s/p A2P2 edge to edge repair with single NTW device. Trivial residual MR and mean gradient of 6 mm Hg. There is significant bidirectional shunting at transeptal puncture.  - NYHA III. He has significant volume overload on exam and by REDS in setting of not taking his full dose of lasix - Will have him take lasix 80 bid for 2 days. Add jardiance 10 to help with  diuresis and for heart/renal outcomes. Once on Jardiance decrease lasix to 80 daily with extra doses as needed.  - Hydral stopped due to low BP. BP now up. Hopefully will improve with diuresis.  - Not on ARNI or spiro due to CKD 4 - F/u with [place] in 4 weeks   2. Mitral regurgitation - as above. S/p MitraClip - I looked at echo and doubt the septal defect will be a problem for him   3. HTN - Hydral stopped due to low BP. BP now up. Hopefully will improve with diuresis.   4. CAD s/p re-do CABG - cath 9/19 significant CAD but not amenable to PCI - continue medical management per Dr. Gwenlyn Found - No s/s angina - Adding jardiance  5. Chronic AF - by ECG dates back to at least 7/18. Likely no role for DC-CV here - rate controlled - continue apixaban  Total time spent 35 minutes. Over half that time spent discussing above.    Glori Bickers, MD  1:56 PM

## 2020-06-24 NOTE — Patient Instructions (Signed)
Start Jardiance 10 mg Daily  Increase Furosemide to 80 mg Twice daily FOR TODAY AND TOMORROW ONLY, THEN back to 80 mg Daily  Your physician recommends that you return for lab work in: 1 week  Your physician recommends that you schedule a follow-up appointment in: 4 weeks  If you have any questions or concerns before your next appointment please send [place] a message through [place] or call our office at [phone].    TO LEAVE A MESSAGE FOR THE NURSE SELECT OPTION 2, PLEASE LEAVE A MESSAGE INCLUDING:  YOUR NAME  DATE OF BIRTH  CALL BACK NUMBER  REASON FOR CALL**this is important as we prioritize the call backs  YOU WILL RECEIVE A CALL BACK THE SAME DAY AS LONG AS YOU CALL BEFORE 4:00 PM  At the [facility], you and your health needs are our priority. As part of our continuing mission to provide you with exceptional heart care, we have created designated Provider Care Teams. These Care Teams include your primary Cardiologist (physician) and Advanced Practice Providers (APPs- Physician Assistants and Nurse Practitioners) who all work together to provide you with the care you need, when you need it.   You may see any of the following providers on your designated Care Team at your next follow up:  Dr Glori Bickers  Dr Haynes Kerns, NP  Lyda Jester, [place]  Audry Riles, PharmD   Please be sure to bring in all your medications bottles to every appointment.

## 2020-06-24 NOTE — Progress Notes (Signed)
ReDS Vest / Clip - 06/24/20 1300      ReDS Vest / Clip   Station Marker D    Ruler Value 35    ReDS Value Range High volume overload    ReDS Actual Value 51    Anatomical Comments sitting

## 2020-07-01 ENCOUNTER — Ambulatory Visit (HOSPITAL_COMMUNITY)
Admission: RE | Admit: 2020-07-01 | Discharge: 2020-07-01 | Disposition: A | Discharge status: Home / Self Care | Payer: Medicare Other | Source: Ambulatory Visit | Attending: Internal Medicine | Admitting: Internal Medicine

## 2020-07-01 ENCOUNTER — Other Ambulatory Visit: Payer: Self-pay

## 2020-07-01 DIAGNOSIS — I272 Pulmonary hypertension, unspecified: Secondary | ICD-10-CM | POA: Diagnosis not present

## 2020-07-01 LAB — BASIC METABOLIC PANEL
Anion gap: 11 (ref 5–15)
BUN: 39 mg/dL — ABNORMAL HIGH (ref 8–23)
CO2: 31 mmol/L (ref 22–32)
Calcium: 10.2 mg/dL (ref 8.9–10.3)
Chloride: 99 mmol/L (ref 98–111)
Creatinine, Ser: 2.56 mg/dL — ABNORMAL HIGH (ref 0.61–1.24)
GFR calc Af Amer: 25 mL/min — ABNORMAL LOW (ref >60)
GFR calc non Af Amer: 22 mL/min — ABNORMAL LOW (ref >60)
Glucose, Bld: 220 mg/dL — ABNORMAL HIGH (ref 70–99)
Potassium: 3.6 mmol/L (ref 3.5–5.1)
Sodium: 141 mmol/L (ref 135–145)

## 2020-07-17 ENCOUNTER — Telehealth (HOSPITAL_COMMUNITY): Payer: Self-pay | Admitting: *Deleted

## 2020-07-17 MED ORDER — EMPAGLIFLOZIN 10 MG PO TABS: 10.0000 mg | ORAL_TABLET | Freq: Every day | ORAL | 3 refills | Status: DC

## 2020-07-17 NOTE — Telephone Encounter (Signed)
Pt states he is out of jardiance and needs a [facility] day supply [facility], rx sent in, pt will come by office to get samples to last him until he gets the med in the mail  Medication Samples have been provided to the patient.  Drug name: Jardiance       Strength: 10 mg        Qty: 2  LOT: [id number]  Exp.Date: 2/22  Dosing instructions: take 1 tab daily  The patient has been instructed regarding the correct time, dose, and frequency of taking this medication, including desired effects and most common side effects.   Camari Wisham 11:59 AM 07/17/2020

## 2020-07-20 ENCOUNTER — Encounter (HOSPITAL_COMMUNITY): Payer: Self-pay

## 2020-07-20 ENCOUNTER — Ambulatory Visit (INDEPENDENT_AMBULATORY_CARE_PROVIDER_SITE_OTHER): Payer: Medicare Other

## 2020-07-20 ENCOUNTER — Ambulatory Visit (HOSPITAL_COMMUNITY)
Admission: EM | Admit: 2020-07-20 | Discharge: 2020-07-20 | Disposition: A | Discharge status: Home / Self Care | Payer: Medicare Other | Attending: Family Medicine | Admitting: Family Medicine

## 2020-07-20 ENCOUNTER — Other Ambulatory Visit: Payer: Self-pay

## 2020-07-20 DIAGNOSIS — M7989 Other specified soft tissue disorders: Secondary | ICD-10-CM

## 2020-07-20 DIAGNOSIS — M19041 Primary osteoarthritis, right hand: Secondary | ICD-10-CM | POA: Diagnosis not present

## 2020-07-20 DIAGNOSIS — S6991XA Unspecified injury of right wrist, hand and finger(s), initial encounter: Secondary | ICD-10-CM

## 2020-07-20 DIAGNOSIS — M79641 Pain in right hand: Secondary | ICD-10-CM | POA: Diagnosis not present

## 2020-07-20 LAB — POCT URINALYSIS DIPSTICK, ED / UC
Bilirubin Urine: NEGATIVE
Glucose, UA: >=1000 mg/dL — AB
Hgb urine dipstick: NEGATIVE
Ketones, ur: NEGATIVE mg/dL
Leukocytes,Ua: NEGATIVE
Nitrite: NEGATIVE
Protein, ur: NEGATIVE mg/dL
Specific Gravity, Urine: 1.01 (ref 1.005–1.030)
Urobilinogen, UA: 0.2 mg/dL (ref 0.0–1.0)
pH: 5.5 (ref 5.0–8.0)

## 2020-07-20 MED ORDER — DICLOFENAC SODIUM 1 % EX GEL: 2.0000 g | Freq: Four times a day (QID) | CUTANEOUS | 0 refills | Status: DC

## 2020-07-20 MED ORDER — CEPHALEXIN 500 MG PO CAPS: 500.0000 mg | ORAL_CAPSULE | Freq: Two times a day (BID) | ORAL | 0 refills | Status: AC

## 2020-07-20 NOTE — ED Triage Notes (Signed)
Pt c/o swollen and erythematous right 4th digitx3 wks. Pt also c/o 9/10 throbbing pain in finger that radiates to wrist. Pt states a month ago his dog jumped onto his lap and injured that finger. Pt's 4th right digit has 2+ swelling and is erythematous.

## 2020-07-20 NOTE — Discharge Instructions (Signed)
Your x-ray did not show any fractures.  We will go ahead and treat you with some antibiotics in case this is infection based on the swelling and redness. Also given you some Voltaren gel to apply this is a anti-inflammatory medication to help with pain, swelling Your urine did not show any concerns do not believe urine ketoacidosis Please follow-up with your doctor this week for any continued problems

## 2020-07-22 ENCOUNTER — Other Ambulatory Visit: Payer: Self-pay | Admitting: Cardiovascular Disease

## 2020-07-22 NOTE — ED Provider Notes (Signed)
[employer]    CSN: [id number] Arrival date & time: 07/20/20  1033      History   Chief Complaint Chief Complaint  Patient presents with  . swollen finger    HPI [name] is a 86 y.o. male.   Patient is a 86 year old male who presents today with swollen and erythematous right fourth digit.  This has been ongoing issue for 3 weeks.  Pain radiates into hand area.  Describes as 9/10 throbbing pain.  Approximately 1 month ago prior to starting his dog jumped into his left and injured that finger.  He never had any x-rays done.  Tender to touch with limited range of motion.  No fevers, chills.  Does have history of gout     Past Medical History:  Diagnosis Date  . Age-related macular degeneration, wet, both eyes ([place])   . Anemia    Secondary to acute blood loss  . Anginal pain ([place])    last chest pain in Feb 2021  . Arthritis    "mild in hands, knees, ankles" (11/13/2015)  . Atrial fibrillation ([place])    Consideration was given for atrial flutter ablation, but patient developed atrial fibrillation. Cardioversion was done. Dr. Caryl Comes decided to watch him clinically. November, 2011  . Atrial flutter [facility]) 07/2010   September, 2011   Hospital with PNA and cath done.Marland KitchenMarland KitchenCoumadin.  Atrial flutter ablation planned, but  pt. then had atrial fibrillation,/outpatient conversion 09/08/10..NSR..plan to follow..Dr. Caryl Comes  . CAD (coronary artery disease)    Catheterization, September, 2011,  grafts patent from redo CABG,, medical therapy of coronary disease, consideration to proceeding with atrial flutter ablation  . Carotid artery disease ([place])    Doppler 09/18/2009 - 49% bilateral stenoses  . Carotid artery disease ([name])    49% bilateral, Doppler, November, 2010  . CHF (congestive heart failure) ([place])   . Chronic kidney disease (CKD), stage III (moderate)   . Diabetic peripheral neuropathy ([name])    feet  . Diverticulosis of colon with hemorrhage 2009   several unit  diverticular bleed   . Erectile dysfunction    Mild  . Gout   . Hearing loss    wears hearing aids  . History of blood transfusion "several"   related to diverticular bleeding  . Hyperlipidemia   . Hypertension   . Mitral regurgitation    Mild, echo, September, 2011  . NSTEMI (non-ST elevated myocardial infarction) ([place]) 07/2010   at [facility] with repeat cath, rec medical mgmt   . OSA on CPAP    uses CPAP nightly  . Personal history of colonic polyps   . PNA (pneumonia) 9/11   NSTEMI at [facility] with repeat cath, rec medical mgmt   . RBBB (right bundle branch block)   . S/P mitral valve repair 02/06/2020   s/p TEER with a single MitraClip NTW placed on A2/P2  . Shoulder pain    "positional; better now" (11/13/2015)  . Skin cancer    R lower leg, per derm 2012  . Type II diabetes mellitus [facility])     Patient Active Problem List   Diagnosis Date Noted  . Severe mitral insufficiency 02/06/2020  . S/P mitral valve repair 02/06/2020  . PAF (paroxysmal atrial fibrillation) ([place])   . CAD (coronary artery disease) 08/02/2018  . Macular degeneration 06/24/2018  . Abdominal wall hernia 05/24/2017  . History of lower GI bleeding   . Pulmonary hypertension ([place])   . Acute on chronic diastolic (congestive) heart failure ([place])  11/20/2015  . Gout 09/09/2014  . Medicare annual wellness visit, initial 09/09/2014  . RBBB (right bundle branch block)   . Carotid artery disease ([place])   . Hx of CABG   . Non-rheumatic mitral regurgitation   . CRI (chronic renal insufficiency), stage 4 (severe) ([place]) 03/24/2011  . Peripheral sensory neuropathy due to type 2 diabetes mellitus ([place])   . GERD   . Sleep apnea   . Insulin dependent type 2 diabetes mellitus ([place])   . HLD (hyperlipidemia)   . Hypertensive heart disease     Past Surgical History:  Procedure Laterality Date  . CARDIAC CATHETERIZATION  2006   Nuclear..slight lateral ischemia..medical therapy  . CARDIAC CATHETERIZATION  08/04/2010    grafts patent from redo CABG...medical Rx and ablate Atrial flutter (LV not injected)   . CARDIOVERSION  ~ 2010  . CAROTID ENDARTERECTOMY Right 1994  . CATARACT EXTRACTION W/ INTRAOCULAR LENS  IMPLANT, BILATERAL Bilateral   . COLON RESECTION N/A 01/13/2016   Procedure: EXPLORATORY LAPAROTOMY, LEFT AND SIGMOID COLON REMOVAL;  Surgeon: Ralene Ok, MD;  Location: [place];  Service: General;  Laterality: N/A;  Extended open left hemicolectomy and sigmoidectomy   . COLONOSCOPY N/A 11/14/2015   Procedure: COLONOSCOPY;  Surgeon: Manus Gunning, MD;  Location[facility] ENDOSCOPY;  Service: Gastroenterology;  Laterality: N/A;  . COLONOSCOPY Left 01/05/2016   Procedure: COLONOSCOPY;  Surgeon: Manus Gunning, MD;  Location: [place];  Service: Gastroenterology;  Laterality: Left;  no sedation to start, moderate if needed  . COLONOSCOPY W/ POLYPECTOMY    . CORONARY ARTERY BYPASS GRAFT  1995; 2006   "X 3; X3"  . [place], 2006  . DOPPLER ECHOCARDIOGRAPHY  08/2008   EF 60%  . DOPPLER ECHOCARDIOGRAPHY  08/02/2010   65-70%  . DOPPLER ECHOCARDIOGRAPHY  07/2010   MR mild  . ESOPHAGOGASTRODUODENOSCOPY N/A 06/19/2014   Procedure: ESOPHAGOGASTRODUODENOSCOPY (EGD);  Surgeon: Jerene Bears, MD;  Location: [facility] ENDOSCOPY;  Service: Endoscopy;  Laterality: N/A;  . ESOPHAGOGASTRODUODENOSCOPY N/A 11/14/2015   Procedure: ESOPHAGOGASTRODUODENOSCOPY (EGD);  Surgeon: Manus Gunning, MD;  Location: [place];  Service: Gastroenterology;  Laterality: N/A;  . FLEXIBLE SIGMOIDOSCOPY N/A 11/16/2015   Procedure: FLEXIBLE SIGMOIDOSCOPY;  Surgeon: Manus Gunning, MD;  Location: [place];  Service: Gastroenterology;  Laterality: N/A;  . LAPAROSCOPIC CHOLECYSTECTOMY  2008  . LEFT HEART CATH N/A 06/14/2014   Procedure: LEFT HEART CATH;  Surgeon: Sinclair Grooms, MD;  Location: [facility] CATH LAB;  Service: Cardiovascular;  Laterality: N/A;  . LEFT HEART CATH AND CORONARY ANGIOGRAPHY  N/A 08/02/2018   Procedure: LEFT HEART CATH AND CORONARY ANGIOGRAPHY;  Surgeon: [name], MD;  Location: [place] CV LAB;  Service: Cardiovascular;  Laterality: N/A;  . LEFT HEART CATHETERIZATION WITH CORONARY/GRAFT ANGIOGRAM N/A 06/11/2014   Procedure: LEFT HEART CATHETERIZATION WITH Beatrix Fetters;  Surgeon: Sinclair Grooms, MD;  Location: [facility] CATH LAB;  Service: Cardiovascular;  Laterality: N/A;  . MITRAL VALVE REPAIR N/A 02/06/2020   Procedure: MITRAL VALVE REPAIR;  Surgeon: Sherren Mocha, MD;  Location: [place] CV LAB;  Service: Cardiovascular;  Laterality: N/A;  . PERCUTANEOUS CORONARY STENT INTERVENTION (PCI-S) N/A 06/13/2014   Procedure: PERCUTANEOUS CORONARY STENT INTERVENTION (PCI-S);  Surgeon: Sinclair Grooms, MD;  Location: [facility] CATH LAB;  Service: Cardiovascular;  Laterality: N/A;  . RIGHT HEART CATH N/A 01/06/2020   Procedure: RIGHT HEART CATH;  Surgeon: Jolaine Artist, MD;  Location: [place] CV LAB;  Service: Cardiovascular;  Laterality: N/A;  . SKIN CANCER EXCISION Left 10/2015   calf  . SKIN CANCER EXCISION Right 2014?   chest  . TEE WITHOUT CARDIOVERSION N/A 01/06/2020   Procedure: TRANSESOPHAGEAL ECHOCARDIOGRAM (TEE);  Surgeon: Jolaine Artist, MD;  Location: [facility] ENDOSCOPY;  Service: Cardiovascular;  Laterality: N/A;  . TONSILLECTOMY    . VASECTOMY         Home Medications    Prior to Admission medications   Medication Sig Start Date End Date Taking? Authorizing Provider  allopurinol (ZYLOPRIM) 100 MG tablet Take 0.5 tablets (50 mg total) by mouth every Monday, Wednesday, and Friday. 05/27/20   Tonia Ghent, MD  aspirin 81 MG tablet Take 81 mg by mouth daily.    [provider]  BD INSULIN SYRINGE ULTRAFINE 31G X 5/16" 0.3 ML MISC USE DAILY AS INSTRUCTED 12/02/15   Tonia Ghent, MD  cephALEXin (KEFLEX) 500 MG capsule Take 1 capsule (500 mg total) by mouth 2 (two) times daily for 7 days. 07/20/20 07/27/20  Loura Halt A,  NP  cetirizine (ZYRTEC) 10 MG tablet Take 10 mg by mouth daily as needed for allergies.    [provider]  Cholecalciferol (VITAMIN D) 1000 UNITS capsule Take 1,000 Units by mouth daily.     [provider]  diclofenac Sodium (VOLTAREN) 1 % GEL Apply 2 g topically 4 (four) times daily. 07/20/20   Orvan July, NP  ELIQUIS 2.5 MG TABS tablet TAKE 1 TABLET TWICE A DAY 01/14/20   [name], MD  empagliflozin (JARDIANCE) 10 MG TABS tablet Take 1 tablet (10 mg total) by mouth daily before breakfast. 07/17/20   Bensimhon, Shaune Pascal, MD  fish oil-omega-3 fatty acids 1000 MG capsule Take 1 g by mouth daily.     [provider]  furosemide (LASIX) 80 MG tablet Take 1 tablet (80 mg total) by mouth daily. 06/24/20 09/22/20  Bensimhon, Shaune Pascal, MD  glucose blood (FREESTYLE LITE) test strip USE TO TEST BLOOD SUGAR ONCE DAILY AND AS NEEDED FOR DIABETES MELLITUS 05/13/19   Tonia Ghent, MD  hydrALAZINE (APRESOLINE) 25 MG tablet Take 1 tablet (25 mg total) by mouth 2 (two) times daily. 03/24/20 06/24/20  Erlene Quan, PA-C  isosorbide mononitrate (IMDUR) 60 MG 24 hr tablet TAKE ONE AND ONE-HALF TABLETS (90 MG) IN THE MORNING AND 1 TABLET (60 MG) IN THE EVENING  (NEW INSTRUCTIONS) 09/23/19   Duke, Tami Lin, PA  LANTUS 100 UNIT/ML injection INJECT 20 TO 25 UNITS AT BEDTIME 07/26/19   Tonia Ghent, MD  magnesium oxide (MAG-OX) 400 MG tablet Take 400 mg by mouth daily.     [provider]  metoprolol tartrate (LOPRESSOR) 50 MG tablet TAKE 1 TABLET TWICE A DAY 02/21/20   [name], MD  Multiple Vitamin (MULTIVITAMIN WITH MINERALS) TABS Take 1 tablet by mouth daily.    [provider]  nitroGLYCERIN (NITROSTAT) 0.4 MG SL tablet Place 1 tablet (0.4 mg total) under the tongue every 5 (five) minutes as needed for chest pain. 01/24/17   [name], MD  Potassium Gluconate (K-99) 595 MG CAPS Take 595 mg by mouth daily.     [provider]    ranolazine (RANEXA) 500 MG 12 hr tablet TAKE 1 TABLET TWICE A DAY 05/21/20   [name], MD  rosuvastatin (CRESTOR) 40 MG tablet TAKE 1 TABLET AT BEDTIME 07/22/20   [name], MD    Family History  Family History  Problem Relation Age of Onset  . Kidney disease Mother        Kidney failure  . Stroke Mother   . Diabetes Mother   . Heart disease Father        MI  . Arthritis Sister   . Cancer Sister        Throat  . Heart disease Sister        MI  . Diabetes Sister   . Prostate cancer Neg Hx   . Colon cancer Neg Hx     Social History Social History   Tobacco Use  . Smoking status: Former Smoker    Packs/day: 1.00    Years: 8.00    Pack years: 8.00    Types: Cigarettes    Quit date: 1958    Years since quitting: 63.7  . Smokeless tobacco: Never Used  . Tobacco comment: "quit smoking cigarettes in 1958"  Vaping Use  . Vaping Use: Never used  Substance Use Topics  . Alcohol use: No    Alcohol/week: 0.0 standard drinks  . Drug use: No     Allergies   Patient has no known allergies.   Review of Systems Review of Systems   Physical Exam Triage Vital Signs ED Triage Vitals  Enc Vitals Group     BP 07/20/20 1103 128/67     Pulse Rate 07/20/20 1103 85     Resp 07/20/20 1103 18     Temp 07/20/20 1103 98 F (36.7 C)     Temp Source 07/20/20 1103 Oral     SpO2 07/20/20 1103 96 %     Weight 07/20/20 1104 190 lb (86.2 kg)     Height 07/20/20 1104 5\' 7"  (1.702 m)     Head Circumference --      Peak Flow --      Pain Score 07/20/20 1104 9     Pain Loc --      Pain Edu? --      Excl. in [place]? --    No data found.  Updated Vital Signs BP 128/67   Pulse 85   Temp 98 F (36.7 C) (Oral)   Resp 18   Ht 5\' 7"  (1.702 m)   Wt 190 lb (86.2 kg)   SpO2 96%   BMI 29.76 kg/m   Visual Acuity Right Eye Distance:   Left Eye Distance:   Bilateral Distance:    Right Eye Near:   Left Eye Near:    Bilateral Near:     Physical Exam Vitals and nursing  note reviewed.  Constitutional:      Appearance: Normal appearance.  HENT:     Head: Normocephalic and atraumatic.     Nose: Nose normal.  Eyes:     Conjunctiva/sclera: Conjunctivae normal.  Pulmonary:     Effort: Pulmonary effort is normal.  Musculoskeletal:        General: Normal range of motion.     Cervical back: Normal range of motion.     Comments: Moderate swelling and erythema to right fourth digit with limited range of motion.  Skin:    General: Skin is warm and dry.  Neurological:     Mental Status: He is alert.  Psychiatric:        Mood and Affect: Mood normal.      UC Treatments / Results  Labs (all labs ordered are listed, but only abnormal results are displayed) Labs Reviewed  POCT URINALYSIS DIPSTICK, ED / UC -  Abnormal; Notable for the following components:      Result Value   Glucose, UA >=1000 (*)    All other components within normal limits    EKG   Radiology No results found.  Procedures Procedures (including critical care time)  Medications Ordered in UC Medications - No data to display  Initial Impression / Assessment and Plan / UC Course  I have reviewed the triage vital signs and the nursing notes.  Pertinent labs & imaging results that were available during my care of the patient were reviewed by me and considered in my medical decision making (see chart for details).     Finger swelling X-ray without any acute findings or fracture. Will treat for gout versus infection.  Antibiotics as prescribed.  Voltaren gel to finger for inflammation, pain and swelling Return precautions given Follow up as needed for continued or worsening symptoms  Final Clinical Impressions(s) / UC Diagnoses   Final diagnoses:  Finger swelling     Discharge Instructions     Your x-ray did not show any fractures.  We will go ahead and treat you with some antibiotics in case this is infection based on the swelling and redness. Also given you some  Voltaren gel to apply this is a anti-inflammatory medication to help with pain, swelling Your urine did not show any concerns do not believe urine ketoacidosis Please follow-up with your doctor this week for any continued problems    ED Prescriptions    Medication Sig Dispense Auth. Provider   cephALEXin (KEFLEX) 500 MG capsule Take 1 capsule (500 mg total) by mouth 2 (two) times daily for 7 days. 14 capsule Kingsley Farace A, NP   diclofenac Sodium (VOLTAREN) 1 % GEL Apply 2 g topically 4 (four) times daily. 50 g Loura Halt A, NP     PDMP not reviewed this encounter.   Orvan July, NP 07/22/20 1157

## 2020-07-30 ENCOUNTER — Ambulatory Visit (HOSPITAL_COMMUNITY)
Admission: RE | Admit: 2020-07-30 | Discharge: 2020-07-30 | Disposition: A | Discharge status: Home / Self Care | Payer: Medicare Other | Source: Ambulatory Visit | Attending: Cardiology | Admitting: Cardiology

## 2020-07-30 ENCOUNTER — Other Ambulatory Visit: Payer: Self-pay

## 2020-07-30 ENCOUNTER — Encounter (HOSPITAL_COMMUNITY): Payer: Self-pay

## 2020-07-30 VITALS — BP 118/60 | HR 80 | Ht 67.0 in | Wt 183.4 lb

## 2020-07-30 DIAGNOSIS — Z794 Long term (current) use of insulin: Secondary | ICD-10-CM | POA: Diagnosis not present

## 2020-07-30 DIAGNOSIS — Z7982 Long term (current) use of aspirin: Secondary | ICD-10-CM | POA: Insufficient documentation

## 2020-07-30 DIAGNOSIS — E1142 Type 2 diabetes mellitus with diabetic polyneuropathy: Secondary | ICD-10-CM | POA: Diagnosis not present

## 2020-07-30 DIAGNOSIS — I080 Rheumatic disorders of both mitral and aortic valves: Secondary | ICD-10-CM | POA: Insufficient documentation

## 2020-07-30 DIAGNOSIS — I482 Chronic atrial fibrillation, unspecified: Secondary | ICD-10-CM | POA: Diagnosis not present

## 2020-07-30 DIAGNOSIS — M1990 Unspecified osteoarthritis, unspecified site: Secondary | ICD-10-CM | POA: Insufficient documentation

## 2020-07-30 DIAGNOSIS — Z7901 Long term (current) use of anticoagulants: Secondary | ICD-10-CM | POA: Insufficient documentation

## 2020-07-30 DIAGNOSIS — I255 Ischemic cardiomyopathy: Secondary | ICD-10-CM | POA: Insufficient documentation

## 2020-07-30 DIAGNOSIS — Z79899 Other long term (current) drug therapy: Secondary | ICD-10-CM | POA: Diagnosis not present

## 2020-07-30 DIAGNOSIS — M109 Gout, unspecified: Secondary | ICD-10-CM | POA: Insufficient documentation

## 2020-07-30 DIAGNOSIS — Z95818 Presence of other cardiac implants and grafts: Secondary | ICD-10-CM | POA: Insufficient documentation

## 2020-07-30 DIAGNOSIS — I5022 Chronic systolic (congestive) heart failure: Secondary | ICD-10-CM | POA: Insufficient documentation

## 2020-07-30 DIAGNOSIS — G4733 Obstructive sleep apnea (adult) (pediatric): Secondary | ICD-10-CM | POA: Diagnosis not present

## 2020-07-30 DIAGNOSIS — Z791 Long term (current) use of non-steroidal anti-inflammatories (NSAID): Secondary | ICD-10-CM | POA: Insufficient documentation

## 2020-07-30 DIAGNOSIS — Z9049 Acquired absence of other specified parts of digestive tract: Secondary | ICD-10-CM | POA: Insufficient documentation

## 2020-07-30 DIAGNOSIS — Z9989 Dependence on other enabling machines and devices: Secondary | ICD-10-CM | POA: Insufficient documentation

## 2020-07-30 DIAGNOSIS — N184 Chronic kidney disease, stage 4 (severe): Secondary | ICD-10-CM | POA: Diagnosis not present

## 2020-07-30 DIAGNOSIS — E785 Hyperlipidemia, unspecified: Secondary | ICD-10-CM | POA: Diagnosis not present

## 2020-07-30 DIAGNOSIS — E1122 Type 2 diabetes mellitus with diabetic chronic kidney disease: Secondary | ICD-10-CM | POA: Diagnosis not present

## 2020-07-30 DIAGNOSIS — Z87891 Personal history of nicotine dependence: Secondary | ICD-10-CM | POA: Diagnosis not present

## 2020-07-30 DIAGNOSIS — I5042 Chronic combined systolic (congestive) and diastolic (congestive) heart failure: Secondary | ICD-10-CM | POA: Diagnosis not present

## 2020-07-30 DIAGNOSIS — I779 Disorder of arteries and arterioles, unspecified: Secondary | ICD-10-CM | POA: Insufficient documentation

## 2020-07-30 DIAGNOSIS — I252 Old myocardial infarction: Secondary | ICD-10-CM | POA: Diagnosis not present

## 2020-07-30 DIAGNOSIS — I25810 Atherosclerosis of coronary artery bypass graft(s) without angina pectoris: Secondary | ICD-10-CM | POA: Insufficient documentation

## 2020-07-30 DIAGNOSIS — I130 Hypertensive heart and chronic kidney disease with heart failure and stage 1 through stage 4 chronic kidney disease, or unspecified chronic kidney disease: Secondary | ICD-10-CM | POA: Insufficient documentation

## 2020-07-30 DIAGNOSIS — Z951 Presence of aortocoronary bypass graft: Secondary | ICD-10-CM | POA: Diagnosis not present

## 2020-07-30 DIAGNOSIS — Z8719 Personal history of other diseases of the digestive system: Secondary | ICD-10-CM | POA: Insufficient documentation

## 2020-07-30 DIAGNOSIS — Z8249 Family history of ischemic heart disease and other diseases of the circulatory system: Secondary | ICD-10-CM | POA: Insufficient documentation

## 2020-07-30 LAB — CBC
HCT: 38.9 % — ABNORMAL LOW (ref 39.0–52.0)
Hemoglobin: 12 g/dL — ABNORMAL LOW (ref 13.0–17.0)
MCH: 29.8 pg (ref 26.0–34.0)
MCHC: 30.8 g/dL (ref 30.0–36.0)
MCV: 96.5 fL (ref 80.0–100.0)
Platelets: 148 10*3/uL — ABNORMAL LOW (ref 150–400)
RBC: 4.03 MIL/uL — ABNORMAL LOW (ref 4.22–5.81)
RDW: 15.2 % (ref 11.5–15.5)
WBC: 7.5 10*3/uL (ref 4.0–10.5)
nRBC: 0 % (ref 0.0–0.2)

## 2020-07-30 LAB — BASIC METABOLIC PANEL
Anion gap: 11 (ref 5–15)
BUN: 39 mg/dL — ABNORMAL HIGH (ref 8–23)
CO2: 26 mmol/L (ref 22–32)
Calcium: 9.8 mg/dL (ref 8.9–10.3)
Chloride: 101 mmol/L (ref 98–111)
Creatinine, Ser: 2.69 mg/dL — ABNORMAL HIGH (ref 0.61–1.24)
GFR calc Af Amer: 24 mL/min — ABNORMAL LOW (ref >60)
GFR calc non Af Amer: 21 mL/min — ABNORMAL LOW (ref >60)
Glucose, Bld: 249 mg/dL — ABNORMAL HIGH (ref 70–99)
Potassium: 4.4 mmol/L (ref 3.5–5.1)
Sodium: 138 mmol/L (ref 135–145)

## 2020-07-30 LAB — BRAIN NATRIURETIC PEPTIDE: B Natriuretic Peptide: 370.9 pg/mL — ABNORMAL HIGH (ref 0.0–100.0)

## 2020-07-30 MED ORDER — FUROSEMIDE 80 MG PO TABS: 80.0000 mg | ORAL_TABLET | Freq: Every day | ORAL | 3 refills | Status: DC

## 2020-07-30 NOTE — Progress Notes (Signed)
ReDS Vest / Clip - 07/30/20 1204      ReDS Vest / Clip   Station Marker D    Ruler Value 33    ReDS Value Range Moderate volume overload    ReDS Actual Value 39    Anatomical Comments sitting

## 2020-07-30 NOTE — Progress Notes (Signed)
ADVANCED HF CLINIC PROGRESS NOTE   Primary Cardiologist: Gwenlyn Found [facility]: Dr. Haroldine Laws   HPI:  [name] is an 86 y.o. male with h/o CAD s/p re-do CABG ([id number]), carotid disease s/p CEA, HTN, HLN, chronic AF, chronic GI bleeding s/p left hemicolectomy. He was referred by Dr. Gwenlyn Found for evaluation of ongoing dyspnea.   He has had several caths over the past 5 years with stenting of SVG to OM on 2015. Last cath 9/19 newly occluded SVG to RCA and high-grade OM1 lesion which was treated medically due to unfavorable anatomy.   Dr. Haroldine Laws saw him for the first time in 1/21 and  felt MR was the major issue.   Eventually underwent RHC and TEE and followed by MitraClip (02/06/20)   RA = 10 RV = 58/8 PA = 58/19 (33) PCW = 20 (v waves to 35)  Fick cardiac output/index = 4.3/2.1 Thermo CO/CI = 3.2/1.7 PVR = 3.1 (Fick) 4.0 (thermo) Ao sat = 99% PA sat = 64%, 68%   Echo 4/21: EF 45-50%, s/p A2P2 edge to edge repair with single NTW device. Trivial residual MR and mean gradient of 6 mm Hg. There is significant bidirectional shunting at transeptal puncture.   He was recently seen in clinic 06/24/20 and was fluid overloaded w/ NYHA Class III symptoms. ReDs clip was elevated at 51%. His wt was 193 lb. His BP was also elevated at 162/80. This was in the setting of poor compliance w/ Lasix. He was instructed to increase lasix to 80 mg bid x 2 days, then to return to 80 mg daily thereafter. Jardiance 10 mg also added to regimen.   Today in f/u, he feels much improved. Breathing improved, now NYHA Class II-III. Wt is down 10 lb from 193>>183 lb. ReDs Clip now 39%. BP better today, 118/60. Reports improved compliance w/ meds.    Echo 8/20  1. LVEF 40-45% akinesis of the mid and apical inferior walls and hypokinesis of the inferolateral walls consistent with prior infarct in the RCA or left Cx territory.  2. The right ventricle has moderately reduced systolic function. The cavity was normal. There  is no increase in right ventricular wall thickness. Right ventricular systolic pressure is severely elevated at 27mmHg .  3. Moderate MR  4. The aortic valve has an indeterminate number of cusps. Moderate thickening of the aortic valve. Severe calcifcation of the aortic valve. Aortic valve regurgitation is mild by color flow Doppler.   Past Medical History:  Diagnosis Date  . Age-related macular degeneration, wet, both eyes ([place])   . Anemia    Secondary to acute blood loss  . Anginal pain ([place])    last chest pain in Feb 2021  . Arthritis    "mild in hands, knees, ankles" (11/13/2015)  . Atrial fibrillation ([place])    Consideration was given for atrial flutter ablation, but patient developed atrial fibrillation. Cardioversion was done. Dr. Caryl Comes decided to watch him clinically. November, 2011  . Atrial flutter [facility]) 07/2010   September, 2011   Hospital with PNA and cath done.Marland KitchenMarland KitchenCoumadin.  Atrial flutter ablation planned, but  pt. then had atrial fibrillation,/outpatient conversion 09/08/10..NSR..plan to follow..Dr. Caryl Comes  . CAD (coronary artery disease)    Catheterization, September, 2011,  grafts patent from redo CABG,, medical therapy of coronary disease, consideration to proceeding with atrial flutter ablation  . Carotid artery disease ([place])    Doppler 09/18/2009 - 49% bilateral stenoses  . Carotid artery disease ([name])    49% bilateral,  Doppler, November, 2010  . CHF (congestive heart failure) ([place])   . Chronic kidney disease (CKD), stage III (moderate)   . Diabetic peripheral neuropathy ([name])    feet  . Diverticulosis of colon with hemorrhage 2009   several unit diverticular bleed   . Erectile dysfunction    Mild  . Gout   . Hearing loss    wears hearing aids  . History of blood transfusion "several"   related to diverticular bleeding  . Hyperlipidemia   . Hypertension   . Mitral regurgitation    Mild, echo, September, 2011  . NSTEMI (non-ST elevated myocardial infarction)  ([place]) 07/2010   at [facility] with repeat cath, rec medical mgmt   . OSA on CPAP    uses CPAP nightly  . Personal history of colonic polyps   . PNA (pneumonia) 9/11   NSTEMI at [facility] with repeat cath, rec medical mgmt   . RBBB (right bundle branch block)   . S/P mitral valve repair 02/06/2020   s/p TEER with a single MitraClip NTW placed on A2/P2  . Shoulder pain    "positional; better now" (11/13/2015)  . Skin cancer    R lower leg, per derm 2012  . Type II diabetes mellitus ([place])     Current Outpatient Medications  Medication Sig Dispense Refill  . allopurinol (ZYLOPRIM) 100 MG tablet Take 0.5 tablets (50 mg total) by mouth every Monday, Wednesday, and Friday. 30 tablet 3  . aspirin 81 MG tablet Take 81 mg by mouth daily.    . BD INSULIN SYRINGE ULTRAFINE 31G X 5/16" 0.3 ML MISC USE DAILY AS INSTRUCTED 100 each 2  . cetirizine (ZYRTEC) 10 MG tablet Take 10 mg by mouth daily as needed for allergies.    . Cholecalciferol (VITAMIN D) 1000 UNITS capsule Take 1,000 Units by mouth daily.     . diclofenac Sodium (VOLTAREN) 1 % GEL Apply 2 g topically 4 (four) times daily. 50 g 0  . ELIQUIS 2.5 MG TABS tablet TAKE 1 TABLET TWICE A DAY 180 tablet 3  . empagliflozin (JARDIANCE) 10 MG TABS tablet Take 1 tablet (10 mg total) by mouth daily before breakfast. 90 tablet 3  . fish oil-omega-3 fatty acids 1000 MG capsule Take 1 g by mouth daily.     . furosemide (LASIX) 80 MG tablet Take 1 tablet (80 mg total) by mouth daily. 30 tablet 3  . glucose blood (FREESTYLE LITE) test strip USE TO TEST BLOOD SUGAR ONCE DAILY AND AS NEEDED FOR DIABETES MELLITUS 300 each 3  . hydrALAZINE (APRESOLINE) 25 MG tablet Take 1 tablet (25 mg total) by mouth 2 (two) times daily. 180 tablet 3  . isosorbide mononitrate (IMDUR) 60 MG 24 hr tablet TAKE ONE AND ONE-HALF TABLETS (90 MG) IN THE MORNING AND 1 TABLET (60 MG) IN THE EVENING  (NEW INSTRUCTIONS) 270 tablet 3  . LANTUS 100 UNIT/ML injection INJECT 20 TO 25 UNITS AT BEDTIME  40 mL 3  . magnesium oxide (MAG-OX) 400 MG tablet Take 400 mg by mouth daily.     . metoprolol tartrate (LOPRESSOR) 50 MG tablet TAKE 1 TABLET TWICE A DAY 180 tablet 1  . Multiple Vitamin (MULTIVITAMIN WITH MINERALS) TABS Take 1 tablet by mouth daily.    . nitroGLYCERIN (NITROSTAT) 0.4 MG SL tablet Place 1 tablet (0.4 mg total) under the tongue every 5 (five) minutes as needed for chest pain. 100 tablet 1  . Potassium Gluconate (K-99) 595 MG CAPS Take 595 mg  by mouth daily.     . ranolazine (RANEXA) 500 MG 12 hr tablet TAKE 1 TABLET TWICE A DAY 180 tablet 3  . rosuvastatin (CRESTOR) 40 MG tablet TAKE 1 TABLET AT BEDTIME 90 tablet 3   No current facility-administered medications for this encounter.    No Known Allergies    Social History   Socioeconomic History  . Marital status: Widowed    Spouse name: Not on file  . Number of children: 2  . Years of education: Not on file  . Highest education level: Not on file  Occupational History  . 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 Smoking status: Former Smoker    Packs/day: 1.00    Years: 8.00    Pack years: 8.00    Types: Cigarettes    Quit date: 1958    Years since quitting: 63.7  . Smokeless tobacco: Never Used  . Tobacco comment: "quit smoking cigarettes in 1958"  Vaping Use  . Vaping Use: Never used  Substance and Sexual Activity  . Alcohol use: No    Alcohol/week: 0.0 standard drinks  . Drug use: No  . Sexual activity: Yes  Other Topics Concern  . Not on file  Social History Narrative   From [place].  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     Lives with girlfriend [name].  Widowed 12/2005.   Social Determinants of Health   Financial Resource Strain:   . Difficulty of Paying Living Expenses: Not on file  Food Insecurity:   . Worried About Charity fundraiser in the Last Year: Not on file  . Ran Out of Food in the Last Year: Not on file    Transportation Needs:   . Lack of Transportation (Medical): Not on file  . Lack of Transportation (Non-Medical): Not on file  Physical Activity:   . Days of Exercise per Week: Not on file  . Minutes of Exercise per Session: Not on file  Stress:   . Feeling of Stress : Not on file  Social Connections:   . Frequency of Communication with Friends and Family: Not on file  . Frequency of Social Gatherings with Friends and Family: Not on file  . Attends Religious Services: Not on file  . Active Member of Clubs or Organizations: Not on file  . Attends Archivist Meetings: Not on file  . Marital Status: Not on file  Intimate Partner Violence:   . Fear of Current or Ex-Partner: Not on file  . Emotionally Abused: Not on file  . Physically Abused: Not on file  . Sexually Abused: Not on file      Family History  Problem Relation Age of Onset  . Kidney disease Mother        Kidney failure  . Stroke Mother   . Diabetes Mother   . Heart disease Father        MI  . Arthritis Sister   . Cancer Sister        Throat  . Heart disease Sister        MI  . Diabetes Sister   . Prostate cancer Neg Hx   . Colon cancer Neg Hx     Vitals:   07/30/20 1204  BP: 118/60  Pulse: 80  SpO2: 98%  Weight: 83.2 kg (183 lb 6.4 oz)  Height: 5\' 7"  (1.702 m)    PHYSICAL EXAM:  ReDs Clip 39%  General:  Well appearing. No respiratory difficulty HEENT: normal Neck: supple. no JVD. Carotids 2+ bilat; no bruits. No lymphadenopathy or thyromegaly appreciated. Cor: PMI nondisplaced. Regular rate & rhythm. No rubs, gallops or murmurs. Lungs: clear Abdomen: soft, nontender, nondistended. No hepatosplenomegaly. No bruits or masses. Good bowel sounds. Extremities: no cyanosis, clubbing, rash, edema Neuro: alert & oriented x 3, cranial nerves grossly intact. moves all 4 extremities w/o difficulty. Affect pleasant.   ReDS 32%-> 45% -> 51%->>39% today.   ASSESSMENT & PLAN:  1. Chronic systolic  HF due to ischemic CM  - EF 40-45% echo 8/20 3+ MR - s/p MitraClip 3/21 - Echo 4/21: EF 45-50%, s/p A2P2 edge to edge repair with single NTW device. Trivial residual MR and mean gradient of 6 mm Hg. There is significant bidirectional shunting at transeptal puncture.  -NYHA Class II-III. Fluid status improved w/ diuretic dose adjustment but ReDs clip still elevated at 39% (down from 51%, Wt down 10 lb). Check BNP today. If elevated, will plan to increase lasix back to 80 mg bid x 2 days>>80 mg daily.  - Not on ARNI or spiro due to CKD 4 - Continue Jardiance 10 mg daily  - Continue Hydralazine 25 tid - Continue Imdur 60 mg daily  - Continue Metoprolol 50 mg bid  - check BMP today   2. Mitral regurgitation - as above. S/p MitraClip - F/u echo 4/21 showed trivial residual MR and mean gradient of 6 mm Hg.  3. HTN - controlled on current regimen   4. CAD s/p re-do CABG - cath 9/19 significant CAD but not amenable to PCI - stable w/o CP  - continue medical management per Dr. Gwenlyn Found   5. Chronic AF - by ECG dates back to at least 7/18. Likely no role for DC-CV here - rate controlled - continue apixaban. Check CBC today   F/u in 2-3 weeks   Lyda Jester, PA-C  1:31 PM

## 2020-07-30 NOTE — Patient Instructions (Signed)
Labs today We will only contact you if something comes back abnormal or we need to make some changes. Otherwise no news is good news!  Your physician recommends that you schedule a follow-up appointment in: 3 months  If you have any questions or concerns before your next appointment please send [place] a message through [place] or call our office at [phone].    TO LEAVE A MESSAGE FOR THE NURSE SELECT OPTION 2, PLEASE LEAVE A MESSAGE INCLUDING: . YOUR NAME . DATE OF BIRTH . CALL BACK NUMBER . REASON FOR CALL**this is important as we prioritize the call backs  YOU WILL RECEIVE A CALL BACK THE SAME DAY AS LONG AS YOU CALL BEFORE 4:00 PM

## 2020-07-30 NOTE — Addendum Note (Signed)
Encounter addended by: Kerry Dory, CMA on: 07/30/2020 3:38 PM  Actions taken: Pharmacy for encounter modified, Order list changed

## 2020-08-03 ENCOUNTER — Telehealth (HOSPITAL_COMMUNITY): Payer: Self-pay | Admitting: Pharmacy Technician

## 2020-08-03 ENCOUNTER — Other Ambulatory Visit (HOSPITAL_COMMUNITY): Payer: Self-pay | Admitting: *Deleted

## 2020-08-03 NOTE — Telephone Encounter (Signed)
Received a message that Jardiance required a PA from the patient's insurance. [employer] does not use CoverMyMeds to complete PA's now. Called and completed the PA over the phone.  Advanced Heart Failure Patient Advocate Encounter  Prior Authorization for [name] has been approved.    PA# [id number]  Effective dates: 07/04/20 through 11/13/2098  Charlann Boxer, CPhT

## 2020-08-05 ENCOUNTER — Telehealth (HOSPITAL_COMMUNITY): Payer: Self-pay | Admitting: Pharmacy Technician

## 2020-08-05 NOTE — Telephone Encounter (Signed)
Patient called in stating that [facility] had a hold on his Jardiance that I needed to call and clear up. They wanted to confirm the patient's GFR. Called and gave that information. The representative stated that the prescriptions went back to the shipping stage. It should be mailed out by tomorrow.  I called and updated the patient.  Charlann Boxer, CPhT

## 2020-08-07 NOTE — Addendum Note (Signed)
Encounter addended by: Harvie Junior, CMA on: 08/07/2020 10:37 AM  Actions taken: Charge Capture section accepted

## 2020-08-19 ENCOUNTER — Other Ambulatory Visit: Payer: Self-pay | Admitting: Cardiovascular Disease

## 2020-08-30 NOTE — Progress Notes (Signed)
ADVANCED HF CLINIC PROGRESS NOTE   Primary Cardiologist: Gwenlyn Found [facility]: Dr. Haroldine Laws   HPI: [name] is an 86 y.o. male with h/o CAD s/p re-do CABG (1443,1540), carotid disease s/p CEA, HTN, HLN, chronic AF, chronic GI bleeding s/p left hemicolectomy.  He has had several caths over the past 5 years with stenting of SVG to OM on 2015. Last cath 9/19 newly occluded SVG to RCA and high-grade OM1 lesion which was treated medically due to unfavorable anatomy.   Dr. Haroldine Laws saw him for the first time in 1/21 and  felt MR was the major issue.   Eventually underwent RHC and TEE and followed by MitraClip (02/06/20)   Today he returns for HF follow up.Overall feeling fine. SOB with inclines and if he is carrying items.  No chest pain. Denies /PND/Orthopnea. Appetite ok. Says he drinks lots of fluids, snacks on crackers, and had pot roast. No fever or chills. Unable to weigh at home because he cant see the numbers. Walking 1200-1500 feet 2-3 days a week. Taking all medications  Echo 4/21: EF 45-50%, s/p A2P2 edge to edge repair with single NTW device. Trivial residual MR and mean gradient of 6 mm Hg. There is significant bidirectional shunting at transeptal puncture.   Echo 8/20  1. LVEF 40-45% akinesis of the mid and apical inferior walls and hypokinesis of the inferolateral walls consistent with prior infarct in the RCA or left Cx territory.  2. The right ventricle has moderately reduced systolic function. The cavity was normal. There is no increase in right ventricular wall thickness. Right ventricular systolic pressure is severely elevated at 38mmHg .  3. Moderate MR  4. The aortic valve has an indeterminate number of cusps. Moderate thickening of the aortic valve. Severe calcifcation of the aortic valve. Aortic valve regurgitation is mild by color flow Doppler.  RHC 12/2019  RA = 10 RV = 58/8 PA = 58/19 (33) PCW = 20 (v waves to 35)  Fick cardiac output/index = 4.3/2.1 Thermo CO/CI =  3.2/1.7 PVR = 3.1 (Fick) 4.0 (thermo) Ao sat = 99% PA sat = 64%, 68%  Past Medical History:  Diagnosis Date  . Age-related macular degeneration, wet, both eyes ([place])   . Anemia    Secondary to acute blood loss  . Anginal pain ([place])    last chest pain in Feb 2021  . Arthritis    "mild in hands, knees, ankles" (11/13/2015)  . Atrial fibrillation ([place])    Consideration was given for atrial flutter ablation, but patient developed atrial fibrillation. Cardioversion was done. Dr. Caryl Comes decided to watch him clinically. November, 2011  . Atrial flutter [facility]) 07/2010   September, 2011   Hospital with PNA and cath done.Marland KitchenMarland KitchenCoumadin.  Atrial flutter ablation planned, but  pt. then had atrial fibrillation,/outpatient conversion 09/08/10..NSR..plan to follow..Dr. Caryl Comes  . CAD (coronary artery disease)    Catheterization, September, 2011,  grafts patent from redo CABG,, medical therapy of coronary disease, consideration to proceeding with atrial flutter ablation  . Carotid artery disease ([place])    Doppler 09/18/2009 - 49% bilateral stenoses  . Carotid artery disease ([name])    49% bilateral, Doppler, November, 2010  . CHF (congestive heart failure) ([place])   . Chronic kidney disease (CKD), stage III (moderate)   . Diabetic peripheral neuropathy ([name])    feet  . Diverticulosis of colon with hemorrhage 2009   several unit diverticular bleed   . Erectile dysfunction    Mild  . Gout   .  Hearing loss    wears hearing aids  . History of blood transfusion "several"   related to diverticular bleeding  . Hyperlipidemia   . Hypertension   . Mitral regurgitation    Mild, echo, September, 2011  . NSTEMI (non-ST elevated myocardial infarction) ([place]) 07/2010   at [facility] with repeat cath, rec medical mgmt   . OSA on CPAP    uses CPAP nightly  . Personal history of colonic polyps   . PNA (pneumonia) 9/11   NSTEMI at [facility] with repeat cath, rec medical mgmt   . RBBB (right bundle branch block)   . S/P mitral  valve repair 02/06/2020   s/p TEER with a single MitraClip NTW placed on A2/P2  . Shoulder pain    "positional; better now" (11/13/2015)  . Skin cancer    R lower leg, per derm 2012  . Type II diabetes mellitus ([place])     Current Outpatient Medications  Medication Sig Dispense Refill  . allopurinol (ZYLOPRIM) 100 MG tablet Take 0.5 tablets (50 mg total) by mouth every Monday, Wednesday, and Friday. 30 tablet 3  . aspirin 81 MG tablet Take 81 mg by mouth daily.    . BD INSULIN SYRINGE ULTRAFINE 31G X 5/16" 0.3 ML MISC USE DAILY AS INSTRUCTED 100 each 2  . cetirizine (ZYRTEC) 10 MG tablet Take 10 mg by mouth daily as needed for allergies.    . Cholecalciferol (VITAMIN D) 1000 UNITS capsule Take 1,000 Units by mouth daily.     . diclofenac Sodium (VOLTAREN) 1 % GEL Apply 2 g topically 4 (four) times daily. 50 g 0  . ELIQUIS 2.5 MG TABS tablet TAKE 1 TABLET TWICE A DAY 180 tablet 3  . empagliflozin (JARDIANCE) 10 MG TABS tablet Take 1 tablet (10 mg total) by mouth daily before breakfast. 90 tablet 3  . fish oil-omega-3 fatty acids 1000 MG capsule Take 1 g by mouth daily.     . furosemide (LASIX) 80 MG tablet Take 1 tablet (80 mg total) by mouth daily. 90 tablet 3  . glucose blood (FREESTYLE LITE) test strip USE TO TEST BLOOD SUGAR ONCE DAILY AND AS NEEDED FOR DIABETES MELLITUS 300 each 3  . hydrALAZINE (APRESOLINE) 25 MG tablet Take 25 mg by mouth in the morning and at bedtime.    . isosorbide mononitrate (IMDUR) 60 MG 24 hr tablet Take 60 mg by mouth in the morning and at bedtime.    [name] LANTUS 100 UNIT/ML injection INJECT 20 TO 25 UNITS AT BEDTIME 40 mL 3  . magnesium oxide (MAG-OX) 400 MG tablet Take 400 mg by mouth daily.     . metoprolol tartrate (LOPRESSOR) 50 MG tablet TAKE 1 TABLET TWICE A DAY 180 tablet 0  . Multiple Vitamin (MULTIVITAMIN WITH MINERALS) TABS Take 1 tablet by mouth daily.    . nitroGLYCERIN (NITROSTAT) 0.4 MG SL tablet Place 1 tablet (0.4 mg total) under the tongue every  5 (five) minutes as needed for chest pain. 100 tablet 1  . Potassium Gluconate (K-99) 595 MG CAPS Take 595 mg by mouth daily.     . ranolazine (RANEXA) 500 MG 12 hr tablet TAKE 1 TABLET TWICE A DAY 180 tablet 3  . rosuvastatin (CRESTOR) 40 MG tablet TAKE 1 TABLET AT BEDTIME 90 tablet 3   No current facility-administered medications for this encounter.    No Known Allergies    Social History   Socioeconomic History  . Marital status: Widowed    Spouse name:  Not on file  . Number of children: 2  . Years of education: Not on file  . Highest education level: Not on file  Occupational History  . 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 Smoking status: Former Smoker    Packs/day: 1.00    Years: 8.00    Pack years: 8.00    Types: Cigarettes    Quit date: 1958    Years since quitting: 63.8  . Smokeless tobacco: Never Used  . Tobacco comment: "quit smoking cigarettes in 1958"  Vaping Use  . Vaping Use: Never used  Substance and Sexual Activity  . Alcohol use: No    Alcohol/week: 0.0 standard drinks  . Drug use: No  . Sexual activity: Yes  Other Topics Concern  . Not on file  Social History Narrative   From [place].  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     Lives with girlfriend [name].  Widowed 12/2005.   Social Determinants of Health   Financial Resource Strain:   . Difficulty of Paying Living Expenses: Not on file  Food Insecurity:   . Worried About Charity fundraiser in the Last Year: Not on file  . Ran Out of Food in the Last Year: Not on file  Transportation Needs:   . Lack of Transportation (Medical): Not on file  . Lack of Transportation (Non-Medical): Not on file  Physical Activity:   . Days of Exercise per Week: Not on file  . Minutes of Exercise per Session: Not on file  Stress:   . Feeling of Stress : Not on file  Social Connections:   . Frequency of Communication with Friends  and Family: Not on file  . Frequency of Social Gatherings with Friends and Family: Not on file  . Attends Religious Services: Not on file  . Active Member of Clubs or Organizations: Not on file  . Attends Archivist Meetings: Not on file  . Marital Status: Not on file  Intimate Partner Violence:   . Fear of Current or Ex-Partner: Not on file  . Emotionally Abused: Not on file  . Physically Abused: Not on file  . Sexually Abused: Not on file      Family History  Problem Relation Age of Onset  . Kidney disease Mother        Kidney failure  . Stroke Mother   . Diabetes Mother   . Heart disease Father        MI  . Arthritis Sister   . Cancer Sister        Throat  . Heart disease Sister        MI  . Diabetes Sister   . Prostate cancer Neg Hx   . Colon cancer Neg Hx     Vitals:   08/31/20 1141  BP: 112/68  Pulse: 62  SpO2: 99%  Weight: 84.4 kg (186 lb)   Wt Readings from Last 3 Encounters:  08/31/20 84.4 kg (186 lb)  07/30/20 83.2 kg (183 lb 6.4 oz)  07/20/20 86.2 kg (190 lb)    Reds Clip 47%  PHYSICAL EXAM: General:  Walked in the clinic. No resp difficulty HEENT: normal Neck: supple. JVP 10-11. Carotids 2+ bilat; no bruits. No lymphadenopathy or thryomegaly appreciated. Cor: PMI nondisplaced. Regular rate & rhythm. No rubs, gallops or murmurs. Lungs: clear Abdomen: soft, nontender, nondistended. No hepatosplenomegaly. No bruits or masses. Good  bowel sounds. Extremities: no cyanosis, clubbing, rash, R and LLE 1+ edema Neuro: alert & orientedx3, cranial nerves grossly intact. moves all 4 extremities w/o difficulty. Affect pleasant  ASSESSMENT & PLAN:  1. Chronic systolic HF due to ischemic CM  - EF 40-45% echo 8/20 3+ MR - s/p MitraClip 3/21 - Echo 4/21: EF 45-50%, s/p A2P2 edge to edge repair with single NTW device. Trivial residual MR and mean gradient of 6 mm Hg. There is significant bidirectional shunting at transeptal puncture.  -NYHA  III. Reds  Clip 47%. Volume status elevated suspect in the setting of high sodium diet and fluid intake.  -Increase lasix to 80 mg twice a day x2 days then 80 mg daily.  - Not on ARNI or spiro due to CKD 4 - Continue Jardiance 10 mg daily  - Continue Hydralazine 25 tid - Continue Imdur 60 mg daily  - Continue Metoprolol 50 mg bid   2. Mitral regurgitation - as above. S/p MitraClip - F/u echo 4/21 showed trivial residual MR and mean gradient of 6 mm Hg.  3. HTN Stable. No change.   4. CAD s/p re-do CABG - cath 9/19 significant CAD but not amenable to PCI - No chest pain.   5. Chronic AF - by ECG dates back to at least 7/18. Likely no role for DC-CV here - rate controlled - continue apixaban.  Return to clinic next week to reassess volume status. Discussed low salt food choices and limiting fluid intake to < 2 liters per day.    Darrick Grinder, NP  11:49 AM

## 2020-08-31 ENCOUNTER — Ambulatory Visit (HOSPITAL_COMMUNITY)
Admission: RE | Admit: 2020-08-31 | Discharge: 2020-08-31 | Disposition: A | Discharge status: Home / Self Care | Payer: Medicare Other | Source: Ambulatory Visit | Attending: Adult Health | Admitting: Adult Health

## 2020-08-31 ENCOUNTER — Other Ambulatory Visit: Payer: Self-pay

## 2020-08-31 VITALS — BP 112/68 | HR 62 | Wt 186.0 lb

## 2020-08-31 DIAGNOSIS — Z794 Long term (current) use of insulin: Secondary | ICD-10-CM | POA: Diagnosis not present

## 2020-08-31 DIAGNOSIS — M109 Gout, unspecified: Secondary | ICD-10-CM | POA: Diagnosis not present

## 2020-08-31 DIAGNOSIS — Z79899 Other long term (current) drug therapy: Secondary | ICD-10-CM | POA: Diagnosis not present

## 2020-08-31 DIAGNOSIS — I10 Essential (primary) hypertension: Secondary | ICD-10-CM

## 2020-08-31 DIAGNOSIS — E785 Hyperlipidemia, unspecified: Secondary | ICD-10-CM | POA: Diagnosis not present

## 2020-08-31 DIAGNOSIS — I5022 Chronic systolic (congestive) heart failure: Secondary | ICD-10-CM | POA: Diagnosis not present

## 2020-08-31 DIAGNOSIS — I5042 Chronic combined systolic (congestive) and diastolic (congestive) heart failure: Secondary | ICD-10-CM | POA: Diagnosis not present

## 2020-08-31 DIAGNOSIS — I482 Chronic atrial fibrillation, unspecified: Secondary | ICD-10-CM | POA: Insufficient documentation

## 2020-08-31 DIAGNOSIS — H9190 Unspecified hearing loss, unspecified ear: Secondary | ICD-10-CM | POA: Diagnosis not present

## 2020-08-31 DIAGNOSIS — E1122 Type 2 diabetes mellitus with diabetic chronic kidney disease: Secondary | ICD-10-CM | POA: Insufficient documentation

## 2020-08-31 DIAGNOSIS — E1142 Type 2 diabetes mellitus with diabetic polyneuropathy: Secondary | ICD-10-CM | POA: Diagnosis not present

## 2020-08-31 DIAGNOSIS — I255 Ischemic cardiomyopathy: Secondary | ICD-10-CM | POA: Diagnosis not present

## 2020-08-31 DIAGNOSIS — I2510 Atherosclerotic heart disease of native coronary artery without angina pectoris: Secondary | ICD-10-CM | POA: Diagnosis not present

## 2020-08-31 DIAGNOSIS — G4733 Obstructive sleep apnea (adult) (pediatric): Secondary | ICD-10-CM | POA: Diagnosis not present

## 2020-08-31 DIAGNOSIS — H353130 Nonexudative age-related macular degeneration, bilateral, stage unspecified: Secondary | ICD-10-CM | POA: Diagnosis not present

## 2020-08-31 DIAGNOSIS — R0600 Dyspnea, unspecified: Secondary | ICD-10-CM | POA: Diagnosis not present

## 2020-08-31 DIAGNOSIS — Z7982 Long term (current) use of aspirin: Secondary | ICD-10-CM | POA: Diagnosis not present

## 2020-08-31 DIAGNOSIS — M1990 Unspecified osteoarthritis, unspecified site: Secondary | ICD-10-CM | POA: Diagnosis not present

## 2020-08-31 DIAGNOSIS — I080 Rheumatic disorders of both mitral and aortic valves: Secondary | ICD-10-CM | POA: Insufficient documentation

## 2020-08-31 DIAGNOSIS — Z833 Family history of diabetes mellitus: Secondary | ICD-10-CM | POA: Insufficient documentation

## 2020-08-31 DIAGNOSIS — Z8249 Family history of ischemic heart disease and other diseases of the circulatory system: Secondary | ICD-10-CM | POA: Insufficient documentation

## 2020-08-31 DIAGNOSIS — Z7901 Long term (current) use of anticoagulants: Secondary | ICD-10-CM | POA: Diagnosis not present

## 2020-08-31 DIAGNOSIS — I4510 Unspecified right bundle-branch block: Secondary | ICD-10-CM | POA: Diagnosis not present

## 2020-08-31 DIAGNOSIS — Z87891 Personal history of nicotine dependence: Secondary | ICD-10-CM | POA: Diagnosis not present

## 2020-08-31 DIAGNOSIS — N184 Chronic kidney disease, stage 4 (severe): Secondary | ICD-10-CM | POA: Diagnosis not present

## 2020-08-31 DIAGNOSIS — I252 Old myocardial infarction: Secondary | ICD-10-CM | POA: Diagnosis not present

## 2020-08-31 DIAGNOSIS — I130 Hypertensive heart and chronic kidney disease with heart failure and stage 1 through stage 4 chronic kidney disease, or unspecified chronic kidney disease: Secondary | ICD-10-CM | POA: Diagnosis not present

## 2020-08-31 DIAGNOSIS — I4892 Unspecified atrial flutter: Secondary | ICD-10-CM | POA: Diagnosis not present

## 2020-08-31 DIAGNOSIS — Z9049 Acquired absence of other specified parts of digestive tract: Secondary | ICD-10-CM | POA: Insufficient documentation

## 2020-08-31 DIAGNOSIS — D500 Iron deficiency anemia secondary to blood loss (chronic): Secondary | ICD-10-CM | POA: Insufficient documentation

## 2020-08-31 DIAGNOSIS — Z8261 Family history of arthritis: Secondary | ICD-10-CM | POA: Insufficient documentation

## 2020-08-31 DIAGNOSIS — Z85828 Personal history of other malignant neoplasm of skin: Secondary | ICD-10-CM | POA: Insufficient documentation

## 2020-08-31 NOTE — Progress Notes (Signed)
ReDS Vest / Clip - 08/31/20 1100      ReDS Vest / Clip   Station Marker D    Ruler Value 33    ReDS Value Range High volume overload    ReDS Actual Value 47    Anatomical Comments sitting

## 2020-08-31 NOTE — Patient Instructions (Signed)
INCREASE Lasix to 80 mg, twice a day for two days then resume normal dose of 80 mg daily  Your physician recommends that you schedule a follow-up appointment in: 1 week  in the [facility] Pract[facility] ([facility]/NP) Clinic    If you have any questions or concerns before your next appointment please send [place] a message through [facility] or call our office at [phone].    TO LEAVE A MESSAGE FOR THE NURSE SELECT OPTION 2, PLEASE LEAVE A MESSAGE INCLUDING: . YOUR NAME . DATE OF BIRTH . CALL BACK NUMBER . REASON FOR CALL**this is important as we prioritize the call backs  YOU WILL RECEIVE A CALL BACK THE SAME DAY AS LONG AS YOU CALL BEFORE 4:00 PM

## 2020-09-09 ENCOUNTER — Ambulatory Visit (HOSPITAL_COMMUNITY)
Admission: RE | Admit: 2020-09-09 | Discharge: 2020-09-09 | Disposition: A | Discharge status: Home / Self Care | Payer: Medicare Other | Source: Ambulatory Visit | Attending: Cardiology | Admitting: Cardiology

## 2020-09-09 ENCOUNTER — Encounter (HOSPITAL_COMMUNITY): Payer: Self-pay

## 2020-09-09 ENCOUNTER — Other Ambulatory Visit: Payer: Self-pay

## 2020-09-09 VITALS — BP 110/75 | HR 67 | Wt 181.8 lb

## 2020-09-09 DIAGNOSIS — Z7901 Long term (current) use of anticoagulants: Secondary | ICD-10-CM | POA: Insufficient documentation

## 2020-09-09 DIAGNOSIS — I255 Ischemic cardiomyopathy: Secondary | ICD-10-CM | POA: Diagnosis not present

## 2020-09-09 DIAGNOSIS — Z7982 Long term (current) use of aspirin: Secondary | ICD-10-CM | POA: Insufficient documentation

## 2020-09-09 DIAGNOSIS — Z794 Long term (current) use of insulin: Secondary | ICD-10-CM | POA: Diagnosis not present

## 2020-09-09 DIAGNOSIS — Z9049 Acquired absence of other specified parts of digestive tract: Secondary | ICD-10-CM | POA: Insufficient documentation

## 2020-09-09 DIAGNOSIS — Z87891 Personal history of nicotine dependence: Secondary | ICD-10-CM | POA: Insufficient documentation

## 2020-09-09 DIAGNOSIS — Z79899 Other long term (current) drug therapy: Secondary | ICD-10-CM | POA: Diagnosis not present

## 2020-09-09 DIAGNOSIS — I080 Rheumatic disorders of both mitral and aortic valves: Secondary | ICD-10-CM | POA: Insufficient documentation

## 2020-09-09 DIAGNOSIS — I25810 Atherosclerosis of coronary artery bypass graft(s) without angina pectoris: Secondary | ICD-10-CM | POA: Diagnosis not present

## 2020-09-09 DIAGNOSIS — I130 Hypertensive heart and chronic kidney disease with heart failure and stage 1 through stage 4 chronic kidney disease, or unspecified chronic kidney disease: Secondary | ICD-10-CM | POA: Insufficient documentation

## 2020-09-09 DIAGNOSIS — I5022 Chronic systolic (congestive) heart failure: Secondary | ICD-10-CM | POA: Diagnosis not present

## 2020-09-09 DIAGNOSIS — N184 Chronic kidney disease, stage 4 (severe): Secondary | ICD-10-CM | POA: Insufficient documentation

## 2020-09-09 DIAGNOSIS — Z8249 Family history of ischemic heart disease and other diseases of the circulatory system: Secondary | ICD-10-CM | POA: Diagnosis not present

## 2020-09-09 DIAGNOSIS — I2510 Atherosclerotic heart disease of native coronary artery without angina pectoris: Secondary | ICD-10-CM | POA: Insufficient documentation

## 2020-09-09 DIAGNOSIS — I482 Chronic atrial fibrillation, unspecified: Secondary | ICD-10-CM | POA: Insufficient documentation

## 2020-09-09 LAB — BASIC METABOLIC PANEL
Anion gap: 12 (ref 5–15)
BUN: 40 mg/dL — ABNORMAL HIGH (ref 8–23)
CO2: 28 mmol/L (ref 22–32)
Calcium: 9.7 mg/dL (ref 8.9–10.3)
Chloride: 98 mmol/L (ref 98–111)
Creatinine, Ser: 2.76 mg/dL — ABNORMAL HIGH (ref 0.61–1.24)
GFR, Estimated: 22 mL/min — ABNORMAL LOW (ref >60)
Glucose, Bld: 364 mg/dL — ABNORMAL HIGH (ref 70–99)
Potassium: 3.9 mmol/L (ref 3.5–5.1)
Sodium: 138 mmol/L (ref 135–145)

## 2020-09-09 LAB — BRAIN NATRIURETIC PEPTIDE: B Natriuretic Peptide: 479.8 pg/mL — ABNORMAL HIGH (ref 0.0–100.0)

## 2020-09-09 MED ORDER — FUROSEMIDE 80 MG PO TABS: 80.0000 mg | ORAL_TABLET | Freq: Two times a day (BID) | ORAL | 3 refills | Status: DC

## 2020-09-09 NOTE — Patient Instructions (Addendum)
INCREASE Lasix to 80 mg, one tab twice a day  Labs today We will only contact you if something comes back abnormal or we need to make some changes. Otherwise no news is good news!  Keep followup as scheduled in two months   Do the following things EVERYDAY: 1) Weigh yourself in the morning before breakfast. Write it down and keep it in a log. 2) Take your medicines as prescribed 3) Eat low salt foods--Limit salt (sodium) to 2000 mg per day.  4) Stay as active as you can everyday 5) Limit all fluids for the day to less than 2 liters  If you have any questions or concerns before your next appointment please send [place] a message through [facility] or call our office at [phone].    TO LEAVE A MESSAGE FOR THE NURSE SELECT OPTION 2, PLEASE LEAVE A MESSAGE INCLUDING: . YOUR NAME . DATE OF BIRTH . CALL BACK NUMBER . REASON FOR CALL**this is important as we prioritize the call backs  YOU WILL RECEIVE A CALL BACK THE SAME DAY AS LONG AS YOU CALL BEFORE 4:00 PM

## 2020-09-09 NOTE — Progress Notes (Signed)
ADVANCED HF CLINIC PROGRESS NOTE   Primary Cardiologist: Gwenlyn Found [facility]: Dr. Haroldine Laws   HPI: [name] is an 86 y.o. male with h/o CAD s/p re-do CABG ([phone]), carotid disease s/p CEA, HTN, HLN, chronic AF, chronic GI bleeding s/p left hemicolectomy.   He has had several caths over the past 5 years with stenting of SVG to OM on 2015. Last cath 9/19 newly occluded SVG to RCA and high-grade OM1 lesion which was treated medically due to unfavorable anatomy.   Dr. Haroldine Laws saw him for the first time in 1/21 and  felt MR was the major issue.   Eventually underwent RHC and TEE and followed by MitraClip (02/06/20).  Had recent f/u 10/18 and endorsed increased dyspnea w/ NYHA class III symptoms and elevated ReDs clip reading of 47% in the setting of dietary indiscretion w/ sodium. Diuretics were increased. He was instructed to increase lasix to 80 mg bid x 2 days, followed by 80 mg once daily.   He presents back for f/u. Wt is down 5 lb. ReDs clip reading is improved but still elevated at 41% today. Despite this, he has symptomatically improved. He walks around a track daily for exercise. 2 weeks ago, he could only complete 3 labs before getting SOB, now he is able to complete 6 laps w/o having to stop to rest. No exertional dyspnea. Denies orthopnea/PND. No CP. Has tried to reduce sodium intake and limit fluid consumption to < 2L/day. He tells me that he decided to continue on bid dosing of lasix and did not reduce down to 80 mg once daily. He notes his urine is "dark".    Cardiac Studies  Echo 4/21: EF 45-50%, s/p A2P2 edge to edge repair with single NTW device. Trivial residual MR and mean gradient of 6 mm Hg. There is significant bidirectional shunting at transeptal puncture.   Echo 8/20  1. LVEF 40-45% akinesis of the mid and apical inferior walls and hypokinesis of the inferolateral walls consistent with prior infarct in the RCA or left Cx territory.  2. The right ventricle has  moderately reduced systolic function. The cavity was normal. There is no increase in right ventricular wall thickness. Right ventricular systolic pressure is severely elevated at 21mmHg .  3. Moderate MR  4. The aortic valve has an indeterminate number of cusps. Moderate thickening of the aortic valve. Severe calcifcation of the aortic valve. Aortic valve regurgitation is mild by color flow Doppler.  RHC 12/2019  RA = 10 RV = 58/8 PA = 58/19 (33) PCW = 20 (v waves to 35)  Fick cardiac output/index = 4.3/2.1 Thermo CO/CI = 3.2/1.7 PVR = 3.1 (Fick) 4.0 (thermo) Ao sat = 99% PA sat = 64%, 68%  Past Medical History:  Diagnosis Date  . Age-related macular degeneration, wet, both eyes ([place])   . Anemia    Secondary to acute blood loss  . Anginal pain ([place])    last chest pain in Feb 2021  . Arthritis    "mild in hands, knees, ankles" (11/13/2015)  . Atrial fibrillation ([place])    Consideration was given for atrial flutter ablation, but patient developed atrial fibrillation. Cardioversion was done. Dr. Caryl Comes decided to watch him clinically. November, 2011  . Atrial flutter [facility]) 07/2010   September, 2011   Hospital with PNA and cath done.Marland KitchenMarland KitchenCoumadin.  Atrial flutter ablation planned, but  pt. then had atrial fibrillation,/outpatient conversion 09/08/10..NSR..plan to follow..Dr. Caryl Comes  . CAD (coronary artery disease)    Catheterization, September,  2011,  grafts patent from redo CABG,, medical therapy of coronary disease, consideration to proceeding with atrial flutter ablation  . Carotid artery disease ([place])    Doppler 09/18/2009 - 49% bilateral stenoses  . Carotid artery disease ([name])    49% bilateral, Doppler, November, 2010  . CHF (congestive heart failure) ([place])   . Chronic kidney disease (CKD), stage III (moderate)   . Diabetic peripheral neuropathy ([name])    feet  . Diverticulosis of colon with hemorrhage 2009   several unit diverticular bleed   . Erectile dysfunction    Mild  . Gout    . Hearing loss    wears hearing aids  . History of blood transfusion "several"   related to diverticular bleeding  . Hyperlipidemia   . Hypertension   . Mitral regurgitation    Mild, echo, September, 2011  . NSTEMI (non-ST elevated myocardial infarction) ([place]) 07/2010   at [facility] with repeat cath, rec medical mgmt   . OSA on CPAP    uses CPAP nightly  . Personal history of colonic polyps   . PNA (pneumonia) 9/11   NSTEMI at [facility] with repeat cath, rec medical mgmt   . RBBB (right bundle branch block)   . S/P mitral valve repair 02/06/2020   s/p TEER with a single MitraClip NTW placed on A2/P2  . Shoulder pain    "positional; better now" (11/13/2015)  . Skin cancer    R lower leg, per derm 2012  . Type II diabetes mellitus ([place])     Current Outpatient Medications  Medication Sig Dispense Refill  . allopurinol (ZYLOPRIM) 100 MG tablet Take 0.5 tablets (50 mg total) by mouth every Monday, Wednesday, and Friday. 30 tablet 3  . aspirin 81 MG tablet Take 81 mg by mouth daily.    . BD INSULIN SYRINGE ULTRAFINE 31G X 5/16" 0.3 ML MISC USE DAILY AS INSTRUCTED 100 each 2  . cetirizine (ZYRTEC) 10 MG tablet Take 10 mg by mouth daily as needed for allergies.    . Cholecalciferol (VITAMIN D) 1000 UNITS capsule Take 1,000 Units by mouth daily.     . diclofenac Sodium (VOLTAREN) 1 % GEL Apply 2 g topically 4 (four) times daily. 50 g 0  . ELIQUIS 2.5 MG TABS tablet TAKE 1 TABLET TWICE A DAY 180 tablet 3  . empagliflozin (JARDIANCE) 10 MG TABS tablet Take 1 tablet (10 mg total) by mouth daily before breakfast. 90 tablet 3  . fish oil-omega-3 fatty acids 1000 MG capsule Take 1 g by mouth daily.     . furosemide (LASIX) 80 MG tablet Take 1 tablet (80 mg total) by mouth daily. 90 tablet 3  . glucose blood (FREESTYLE LITE) test strip USE TO TEST BLOOD SUGAR ONCE DAILY AND AS NEEDED FOR DIABETES MELLITUS 300 each 3  . hydrALAZINE (APRESOLINE) 25 MG tablet Take 25 mg by mouth in the morning and at  bedtime.    . isosorbide mononitrate (IMDUR) 60 MG 24 hr tablet Take 60 mg by mouth in the morning and at bedtime.    [name] LANTUS 100 UNIT/ML injection INJECT 20 TO 25 UNITS AT BEDTIME 40 mL 3  . magnesium oxide (MAG-OX) 400 MG tablet Take 400 mg by mouth daily.     . metoprolol tartrate (LOPRESSOR) 50 MG tablet TAKE 1 TABLET TWICE A DAY 180 tablet 0  . Multiple Vitamin (MULTIVITAMIN WITH MINERALS) TABS Take 1 tablet by mouth daily.    . nitroGLYCERIN (NITROSTAT) 0.4 MG SL tablet  Place 1 tablet (0.4 mg total) under the tongue every 5 (five) minutes as needed for chest pain. 100 tablet 1  . Potassium Gluconate (K-99) 595 MG CAPS Take 595 mg by mouth daily.     . ranolazine (RANEXA) 500 MG 12 hr tablet TAKE 1 TABLET TWICE A DAY 180 tablet 3  . rosuvastatin (CRESTOR) 40 MG tablet TAKE 1 TABLET AT BEDTIME 90 tablet 3   No current facility-administered medications for this encounter.    No Known Allergies    Social History   Socioeconomic History  . Marital status: Widowed    Spouse name: Not on file  . Number of children: 2  . Years of education: Not on file  . Highest education level: Not on file  Occupational History  . 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 Smoking status: Former Smoker    Packs/day: 1.00    Years: 8.00    Pack years: 8.00    Types: Cigarettes    Quit date: 1958    Years since quitting: 63.8  . Smokeless tobacco: Never Used  . Tobacco comment: "quit smoking cigarettes in 1958"  Vaping Use  . Vaping Use: Never used  Substance and Sexual Activity  . Alcohol use: No    Alcohol/week: 0.0 standard drinks  . Drug use: No  . Sexual activity: Yes  Other Topics Concern  . Not on file  Social History Narrative   From [place].  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     Lives with girlfriend [name].  Widowed 12/2005.   Social Determinants of Health   Financial Resource Strain:   .  Difficulty of Paying Living Expenses: Not on file  Food Insecurity:   . Worried About Charity fundraiser in the Last Year: Not on file  . Ran Out of Food in the Last Year: Not on file  Transportation Needs:   . Lack of Transportation (Medical): Not on file  . Lack of Transportation (Non-Medical): Not on file  Physical Activity:   . Days of Exercise per Week: Not on file  . Minutes of Exercise per Session: Not on file  Stress:   . Feeling of Stress : Not on file  Social Connections:   . Frequency of Communication with Friends and Family: Not on file  . Frequency of Social Gatherings with Friends and Family: Not on file  . Attends Religious Services: Not on file  . Active Member of Clubs or Organizations: Not on file  . Attends Archivist Meetings: Not on file  . Marital Status: Not on file  Intimate Partner Violence:   . Fear of Current or Ex-Partner: Not on file  . Emotionally Abused: Not on file  . Physically Abused: Not on file  . Sexually Abused: Not on file      Family History  Problem Relation Age of Onset  . Kidney disease Mother        Kidney failure  . Stroke Mother   . Diabetes Mother   . Heart disease Father        MI  . Arthritis Sister   . Cancer Sister        Throat  . Heart disease Sister        MI  . Diabetes Sister   . Prostate cancer Neg Hx   . Colon cancer Neg Hx     Vitals:  09/09/20 1435  BP: 110/75  Pulse: 67  SpO2: 96%  Weight: 82.5 kg (181 lb 12.8 oz)   Wt Readings from Last 3 Encounters:  09/09/20 82.5 kg (181 lb 12.8 oz)  08/31/20 84.4 kg (186 lb)  07/30/20 83.2 kg (183 lb 6.4 oz)   PHYSICAL EXAM: ReDs clip 41%  General:  Well appearing. No respiratory difficulty HEENT: normal Neck: supple. no JVD. Carotids 2+ bilat; no bruits. No lymphadenopathy or thyromegaly appreciated. Cor: PMI nondisplaced. Regular rate & rhythm. 2/6 murmur  Lungs: clear Abdomen: soft, nontender, nondistended. No hepatosplenomegaly. No bruits  or masses. Good bowel sounds. Extremities: no cyanosis, clubbing, rash, trace ankle edema Neuro: alert & oriented x 3, cranial nerves grossly intact. moves all 4 extremities w/o difficulty. Affect pleasant.   ASSESSMENT & PLAN:  1. Chronic systolic HF due to ischemic CM  - EF 40-45% echo 8/20 3+ MR - s/p MitraClip 3/21 - Echo 4/21: EF 45-50%, s/p A2P2 edge to edge repair with single NTW device. Trivial residual MR and mean gradient of 6 mm Hg. There is significant bidirectional shunting at transeptal puncture.  -Symptomatically significant improvement since last visit from NYHA Class III>>>II. Volume status improving. Reds Clip 41% (trending down from 47%). Wt down 5 lb - Continue lasix 80 mg bid for now. Check BMP  - Not on ARNI or spiro due to CKD 4 - Continue Jardiance 10 mg daily  - Continue Hydralazine 25 tid - Continue Imdur 60 mg daily  - Continue Metoprolol 50 mg bid  - discussed low sodium diet, daily wts and fluid restriction   2. Mitral regurgitation - as above. S/p MitraClip - F/u echo 4/21 showed trivial residual MR and mean gradient of 6 mm Hg.  3. HTN - controlled on current regimen - check BMP today   4. CAD s/p re-do CABG - cath 9/19 significant CAD but not amenable to PCI - he denies CP  - continue medical therapy  - on ASA,  blocker and statin   5. Chronic AF - by ECG dates back to at least 7/18. Likely no role for DC-CV here - well rate controlled in the 60s - continue  blocker + apixaban.   F/u in 6 weeks.   Lyda Jester, PA-C  2:43 PM

## 2020-09-09 NOTE — Progress Notes (Signed)
ReDS Vest / Clip - 09/09/20 1400      ReDS Vest / Clip   Station Marker D    Ruler Value 35    ReDS Value Range High volume overload (P)     ReDS Actual Value 41 (P)     Anatomical Comments sitting

## 2020-09-17 ENCOUNTER — Other Ambulatory Visit: Payer: Self-pay | Admitting: Cardiology

## 2020-10-01 ENCOUNTER — Other Ambulatory Visit: Payer: Self-pay | Admitting: Family Medicine

## 2020-10-02 NOTE — Telephone Encounter (Signed)
Pharmacy requests refill on: Lantus Vial 10 mL 100U/mL  LAST REFILL: 07/14/2020 LAST OV: 06/25/2019 NEXT OV: Not Scheduled PHARMACY: [facility]   Last HgbA1C (01/20/2020): 8.9

## 2020-10-04 NOTE — Telephone Encounter (Signed)
Sent. Please set follow-up in the near future here at [facility]. 30-minute visit if possible. We can do labs at the visit. He does not need to fast.

## 2020-10-05 NOTE — Telephone Encounter (Signed)
Called pt and scheduled him for 12/16 at 12:30

## 2020-10-29 ENCOUNTER — Encounter (HOSPITAL_COMMUNITY): Payer: Self-pay

## 2020-10-29 ENCOUNTER — Other Ambulatory Visit: Payer: Self-pay

## 2020-10-29 ENCOUNTER — Ambulatory Visit (INDEPENDENT_AMBULATORY_CARE_PROVIDER_SITE_OTHER): Payer: Medicare Other | Admitting: Family Medicine

## 2020-10-29 ENCOUNTER — Encounter: Payer: Self-pay | Admitting: Family Medicine

## 2020-10-29 ENCOUNTER — Ambulatory Visit (HOSPITAL_COMMUNITY)
Admission: RE | Admit: 2020-10-29 | Discharge: 2020-10-29 | Disposition: A | Discharge status: Home / Self Care | Payer: Medicare Other | Source: Ambulatory Visit | Attending: Adult Health | Admitting: Adult Health

## 2020-10-29 VITALS — BP 122/70 | HR 118 | Temp 96.2°F | Ht 67.0 in | Wt 181.5 lb

## 2020-10-29 VITALS — BP 138/72 | HR 80 | Wt 181.8 lb

## 2020-10-29 DIAGNOSIS — Z951 Presence of aortocoronary bypass graft: Secondary | ICD-10-CM | POA: Insufficient documentation

## 2020-10-29 DIAGNOSIS — R0609 Other forms of dyspnea: Secondary | ICD-10-CM

## 2020-10-29 DIAGNOSIS — I5022 Chronic systolic (congestive) heart failure: Secondary | ICD-10-CM | POA: Diagnosis not present

## 2020-10-29 DIAGNOSIS — I10 Essential (primary) hypertension: Secondary | ICD-10-CM | POA: Diagnosis not present

## 2020-10-29 DIAGNOSIS — E119 Type 2 diabetes mellitus without complications: Secondary | ICD-10-CM

## 2020-10-29 DIAGNOSIS — N184 Chronic kidney disease, stage 4 (severe): Secondary | ICD-10-CM | POA: Diagnosis not present

## 2020-10-29 DIAGNOSIS — J310 Chronic rhinitis: Secondary | ICD-10-CM | POA: Diagnosis not present

## 2020-10-29 DIAGNOSIS — I6523 Occlusion and stenosis of bilateral carotid arteries: Secondary | ICD-10-CM

## 2020-10-29 DIAGNOSIS — I130 Hypertensive heart and chronic kidney disease with heart failure and stage 1 through stage 4 chronic kidney disease, or unspecified chronic kidney disease: Secondary | ICD-10-CM | POA: Diagnosis not present

## 2020-10-29 DIAGNOSIS — Z87891 Personal history of nicotine dependence: Secondary | ICD-10-CM | POA: Insufficient documentation

## 2020-10-29 DIAGNOSIS — E1142 Type 2 diabetes mellitus with diabetic polyneuropathy: Secondary | ICD-10-CM

## 2020-10-29 DIAGNOSIS — Z79899 Other long term (current) drug therapy: Secondary | ICD-10-CM | POA: Insufficient documentation

## 2020-10-29 DIAGNOSIS — E1122 Type 2 diabetes mellitus with diabetic chronic kidney disease: Secondary | ICD-10-CM | POA: Insufficient documentation

## 2020-10-29 DIAGNOSIS — M109 Gout, unspecified: Secondary | ICD-10-CM | POA: Diagnosis not present

## 2020-10-29 DIAGNOSIS — E782 Mixed hyperlipidemia: Secondary | ICD-10-CM

## 2020-10-29 DIAGNOSIS — Z7982 Long term (current) use of aspirin: Secondary | ICD-10-CM | POA: Insufficient documentation

## 2020-10-29 DIAGNOSIS — Z7901 Long term (current) use of anticoagulants: Secondary | ICD-10-CM | POA: Insufficient documentation

## 2020-10-29 DIAGNOSIS — I252 Old myocardial infarction: Secondary | ICD-10-CM | POA: Diagnosis not present

## 2020-10-29 DIAGNOSIS — Z794 Long term (current) use of insulin: Secondary | ICD-10-CM | POA: Diagnosis not present

## 2020-10-29 DIAGNOSIS — I080 Rheumatic disorders of both mitral and aortic valves: Secondary | ICD-10-CM | POA: Insufficient documentation

## 2020-10-29 DIAGNOSIS — I340 Nonrheumatic mitral (valve) insufficiency: Secondary | ICD-10-CM

## 2020-10-29 DIAGNOSIS — I482 Chronic atrial fibrillation, unspecified: Secondary | ICD-10-CM | POA: Diagnosis not present

## 2020-10-29 DIAGNOSIS — I255 Ischemic cardiomyopathy: Secondary | ICD-10-CM | POA: Insufficient documentation

## 2020-10-29 DIAGNOSIS — I2510 Atherosclerotic heart disease of native coronary artery without angina pectoris: Secondary | ICD-10-CM | POA: Insufficient documentation

## 2020-10-29 DIAGNOSIS — R0600 Dyspnea, unspecified: Secondary | ICD-10-CM | POA: Diagnosis not present

## 2020-10-29 DIAGNOSIS — Z8249 Family history of ischemic heart disease and other diseases of the circulatory system: Secondary | ICD-10-CM | POA: Insufficient documentation

## 2020-10-29 DIAGNOSIS — I4510 Unspecified right bundle-branch block: Secondary | ICD-10-CM | POA: Insufficient documentation

## 2020-10-29 DIAGNOSIS — E785 Hyperlipidemia, unspecified: Secondary | ICD-10-CM | POA: Insufficient documentation

## 2020-10-29 LAB — CBC WITH DIFFERENTIAL/PLATELET
Basophils Absolute: 0 10*3/uL (ref 0.0–0.1)
Basophils Relative: 0.5 % (ref 0.0–3.0)
Eosinophils Absolute: 0.3 10*3/uL (ref 0.0–0.7)
Eosinophils Relative: 4.1 % (ref 0.0–5.0)
HCT: 40 % (ref 39.0–52.0)
Hemoglobin: 13.2 g/dL (ref 13.0–17.0)
Lymphocytes Relative: 25.4 % (ref 12.0–46.0)
Lymphs Abs: 1.8 10*3/uL (ref 0.7–4.0)
MCHC: 32.9 g/dL (ref 30.0–36.0)
MCV: 93.2 fl (ref 78.0–100.0)
Monocytes Absolute: 0.6 10*3/uL (ref 0.1–1.0)
Monocytes Relative: 8.1 % (ref 3.0–12.0)
Neutro Abs: 4.3 10*3/uL (ref 1.4–7.7)
Neutrophils Relative %: 61.9 % (ref 43.0–77.0)
Platelets: 145 10*3/uL — ABNORMAL LOW (ref 150.0–400.0)
RBC: 4.29 Mil/uL (ref 4.22–5.81)
RDW: 15.9 % — ABNORMAL HIGH (ref 11.5–15.5)
WBC: 7 10*3/uL (ref 4.0–10.5)

## 2020-10-29 LAB — COMPREHENSIVE METABOLIC PANEL
ALT: 15 U/L (ref 0–53)
AST: 18 U/L (ref 0–37)
Albumin: 4 g/dL (ref 3.5–5.2)
Alkaline Phosphatase: 81 U/L (ref 39–117)
BUN: 40 mg/dL — ABNORMAL HIGH (ref 6–23)
CO2: 33 mEq/L — ABNORMAL HIGH (ref 19–32)
Calcium: 10.1 mg/dL (ref 8.4–10.5)
Chloride: 97 mEq/L (ref 96–112)
Creatinine, Ser: 2.8 mg/dL — ABNORMAL HIGH (ref 0.40–1.50)
GFR: 19.79 mL/min — ABNORMAL LOW (ref >60.00)
Glucose, Bld: 334 mg/dL — ABNORMAL HIGH (ref 70–99)
Potassium: 4.4 mEq/L (ref 3.5–5.1)
Sodium: 136 mEq/L (ref 135–145)
Total Bilirubin: 0.8 mg/dL (ref 0.2–1.2)
Total Protein: 7.8 g/dL (ref 6.0–8.3)

## 2020-10-29 LAB — LIPID PANEL
Cholesterol: 123 mg/dL (ref 0–200)
HDL: 30.5 mg/dL — ABNORMAL LOW (ref >39.00)
LDL Cholesterol: 62 mg/dL (ref 0–99)
NonHDL: 92.07
Total CHOL/HDL Ratio: 4
Triglycerides: 148 mg/dL (ref 0.0–149.0)
VLDL: 29.6 mg/dL (ref 0.0–40.0)

## 2020-10-29 LAB — HEMOGLOBIN A1C: Hgb A1c MFr Bld: 9.3 % — ABNORMAL HIGH (ref 4.6–6.5)

## 2020-10-29 LAB — URIC ACID: Uric Acid, Serum: 7.2 mg/dL (ref 4.0–7.8)

## 2020-10-29 LAB — BRAIN NATRIURETIC PEPTIDE: Pro B Natriuretic peptide (BNP): 566 pg/mL — ABNORMAL HIGH (ref 0.0–100.0)

## 2020-10-29 MED ORDER — IPRATROPIUM BROMIDE 0.03 % NA SOLN: 2.0000 | Freq: Two times a day (BID) | NASAL | 3 refills | Status: DC | PRN

## 2020-10-29 MED ORDER — INSULIN GLARGINE 100 UNIT/ML ~~LOC~~ SOLN: 25.0000 [IU] | Freq: Every day | SUBCUTANEOUS | Status: DC

## 2020-10-29 NOTE — Patient Instructions (Signed)
Your physician recommends that you schedule a follow-up appointment in: 3 months with Dr. Haroldine Laws   If you have any questions or concerns before your next appointment please send [place] a message through [facility] or call our office at [phone].    TO LEAVE A MESSAGE FOR THE NURSE SELECT OPTION 2, PLEASE LEAVE A MESSAGE INCLUDING: . YOUR NAME . DATE OF BIRTH . CALL BACK NUMBER . REASON FOR CALL**this is important as we prioritize the call backs  YOU WILL RECEIVE A CALL BACK THE SAME DAY AS LONG AS YOU CALL BEFORE 4:00 PM

## 2020-10-29 NOTE — Progress Notes (Signed)
This visit occurred during the SARS-CoV-2 public health emergency.  Safety protocols were in place, including screening questions prior to the visit, additional usage of staff PPE, and extensive cleaning of exam room while observing appropriate contact time as indicated for disinfecting solutions.  Diabetes:  Using medications without difficulties: yes Hypoglycemic episodes:no Hyperglycemic episodes:no Feet problems: burning in the middle of the night but not having daytime sx.  He is putting up with it.  He will update me as needed.  See after visit summary. Blood Sugars averaging: 180-210, occ lower, diet dependent.   eye exam within last year: due, d/w pt.   He had been off diet a little.  Discussed. Insulin 25-30 units.   Labs pending.    He got relief with atrovent nasal spray.  Prescription sent.  He clearly feels better after valve procedure, mitraclip.  Discussed with patient.  He has been on 80mg  lasix BID.  He has occ cramping at rest but o/w tolerating it.  Compliant.  HTN.  Using medication without problems or lightheadedness: yes Chest pain with exertion:no Edema:minimal in the evening, none in the AM.   Short of breath: not SOB but fatigued with walking.  No claudication Very rare NTG use, possibly none in the last year.    Meds, vitals, and allergies reviewed.   ROS: Per HPI unless specifically indicated in ROS section   GEN: nad, alert and oriented HEENT: ncat NECK: supple w/o LA CV: rrr. PULM: ctab, no inc wob ABD: soft, +bs EXT: no edema SKIN: no acute rash  Diabetic foot exam: Normal inspection No skin breakdown No calluses  Normal DP pulses Dec sensation to light touch and monofilament Nails normal

## 2020-10-29 NOTE — Progress Notes (Signed)
ReDS Vest / Clip - 10/29/20 1100      ReDS Vest / Clip   Station Marker D    Ruler Value 35    ReDS Value Range Low volume    ReDS Actual Value 36    Anatomical Comments sitting

## 2020-10-29 NOTE — Progress Notes (Signed)
ADVANCED HF CLINIC PROGRESS NOTE   Primary Cardiologist: Gwenlyn Found [facility]: Dr. Haroldine Laws   HPI: [name] is an 86 y.o. male with h/o CAD s/p re-do CABG ([id number]), carotid disease s/p CEA, HTN, HLN, chronic AF, chronic GI bleeding s/p left hemicolectomy.   He has had several caths over the past 5 years with stenting of SVG to OM on 2015. Last cath 9/19 newly occluded SVG to RCA and high-grade OM1 lesion which was treated medically due to unfavorable anatomy.   Dr. Haroldine Laws saw him for the first time in 1/21 and  felt MR was the major issue.   Eventually underwent RHC and TEE and followed by MitraClip (02/06/20).  Had recent f/u 10/18 and endorsed increased dyspnea w/ NYHA class III symptoms and elevated ReDs clip reading of 47% in the setting of dietary indiscretion w/ sodium. Diuretics were increased. He was instructed to increase lasix to 80 mg bid x 2 days, followed by 80 mg once daily.   Follow up on 10/27. Wt down 5 lb. ReDs 41%, and was feeling symptomatically better. Continued lasix 80 mg bid dosing.  He presents today for f/u. Weight is stable but having increased SOB. Can walk about 200 feet before becoming winded. Previously taking lasix 80 mg BID but cut back to 80/40 for the past 8-10 days due to leg cramping.  No CP, dizziness, palpitations, PND/orthopnea. Weighs occasionally at home ~185 lbs. No bleeding issues.  Cardiac Studies  Echo 4/21: EF 45-50%, s/p A2P2 edge to edge repair with single NTW device. Trivial residual MR and mean gradient of 6 mm Hg. There is significant bidirectional shunting at transeptal puncture.   Echo 8/20  1. LVEF 40-45% akinesis of the mid and apical inferior walls and hypokinesis of the inferolateral walls consistent with prior infarct in the RCA or left Cx territory.  2. The right ventricle has moderately reduced systolic function. The cavity was normal. There is no increase in right ventricular wall thickness. Right ventricular systolic  pressure is severely elevated at 21mmHg .  3. Moderate MR  4. The aortic valve has an indeterminate number of cusps. Moderate thickening of the aortic valve. Severe calcifcation of the aortic valve. Aortic valve regurgitation is mild by color flow Doppler.  RHC 12/2019  RA = 10 RV = 58/8 PA = 58/19 (33) PCW = 20 (v waves to 35)  Fick cardiac output/index = 4.3/2.1 Thermo CO/CI = 3.2/1.7 PVR = 3.1 (Fick) 4.0 (thermo) Ao sat = 99% PA sat = 64%, 68%  Past Medical History:  Diagnosis Date  . Age-related macular degeneration, wet, both eyes ([place])   . Anemia    Secondary to acute blood loss  . Anginal pain ([place])    last chest pain in Feb 2021  . Arthritis    "mild in hands, knees, ankles" (11/13/2015)  . Atrial fibrillation ([place])    Consideration was given for atrial flutter ablation, but patient developed atrial fibrillation. Cardioversion was done. Dr. Caryl Comes decided to watch him clinically. November, 2011  . Atrial flutter [facility]) 07/2010   September, 2011   Hospital with PNA and cath done.Marland KitchenMarland KitchenCoumadin.  Atrial flutter ablation planned, but  pt. then had atrial fibrillation,/outpatient conversion 09/08/10..NSR..plan to follow..Dr. Caryl Comes  . CAD (coronary artery disease)    Catheterization, September, 2011,  grafts patent from redo CABG,, medical therapy of coronary disease, consideration to proceeding with atrial flutter ablation  . Carotid artery disease ([place])    Doppler 09/18/2009 - 49% bilateral stenoses  .  Carotid artery disease ([name])    49% bilateral, Doppler, November, 2010  . CHF (congestive heart failure) ([place])   . Chronic kidney disease (CKD), stage III (moderate) ([name])   . Diabetic peripheral neuropathy ([name])    feet  . Diverticulosis of colon with hemorrhage 2009   several unit diverticular bleed   . Erectile dysfunction    Mild  . Gout   . Hearing loss    wears hearing aids  . History of blood transfusion "several"   related to diverticular bleeding  . Hyperlipidemia    . Hypertension   . Mitral regurgitation    Mild, echo, September, 2011  . NSTEMI (non-ST elevated myocardial infarction) ([place]) 07/2010   at [facility] with repeat cath, rec medical mgmt   . OSA on CPAP    uses CPAP nightly  . Personal history of colonic polyps   . PNA (pneumonia) 9/11   NSTEMI at [facility] with repeat cath, rec medical mgmt   . RBBB (right bundle branch block)   . S/P mitral valve repair 02/06/2020   s/p TEER with a single MitraClip NTW placed on A2/P2  . Shoulder pain    "positional; better now" (11/13/2015)  . Skin cancer    R lower leg, per derm 2012  . Type II diabetes mellitus ([place])     Current Outpatient Medications  Medication Sig Dispense Refill  . allopurinol (ZYLOPRIM) 100 MG tablet Take 0.5 tablets (50 mg total) by mouth every Monday, Wednesday, and Friday. 30 tablet 3  . aspirin 81 MG tablet Take 81 mg by mouth daily.    . BD INSULIN SYRINGE ULTRAFINE 31G X 5/16" 0.3 ML MISC USE DAILY AS INSTRUCTED 100 each 2  . cetirizine (ZYRTEC) 10 MG tablet Take 10 mg by mouth daily as needed for allergies.    . Cholecalciferol (VITAMIN D) 1000 UNITS capsule Take 1,000 Units by mouth daily.     . diclofenac Sodium (VOLTAREN) 1 % GEL Apply 2 g topically 4 (four) times daily. 50 g 0  . ELIQUIS 2.5 MG TABS tablet TAKE 1 TABLET TWICE A DAY 180 tablet 3  . empagliflozin (JARDIANCE) 10 MG TABS tablet Take 1 tablet (10 mg total) by mouth daily before breakfast. 90 tablet 3  . fish oil-omega-3 fatty acids 1000 MG capsule Take 1 g by mouth daily.    . furosemide (LASIX) 80 MG tablet Take 1 tablet (80 mg total) by mouth 2 (two) times daily. 180 tablet 3  . glucose blood (FREESTYLE LITE) test strip USE TO TEST BLOOD SUGAR ONCE DAILY AND AS NEEDED FOR DIABETES MELLITUS 300 each 3  . hydrALAZINE (APRESOLINE) 25 MG tablet Take 25 mg by mouth in the morning and at bedtime.    . isosorbide mononitrate (IMDUR) 60 MG 24 hr tablet Take 60 mg by mouth in the morning and at bedtime.    [name] LANTUS 100  UNIT/ML injection INJECT 20 TO 25 UNITS AT BEDTIME 40 mL 1  . magnesium oxide (MAG-OX) 400 MG tablet Take 400 mg by mouth daily.    . metoprolol tartrate (LOPRESSOR) 50 MG tablet TAKE 1 TABLET TWICE A DAY 180 tablet 0  . Multiple Vitamin (MULTIVITAMIN WITH MINERALS) TABS Take 1 tablet by mouth daily.    . nitroGLYCERIN (NITROSTAT) 0.4 MG SL tablet Place 1 tablet (0.4 mg total) under the tongue every 5 (five) minutes as needed for chest pain. 100 tablet 1  . Potassium Gluconate 595 MG CAPS Take 595 mg by mouth daily.    [name]  ranolazine (RANEXA) 500 MG 12 hr tablet TAKE 1 TABLET TWICE A DAY 180 tablet 3  . rosuvastatin (CRESTOR) 40 MG tablet TAKE 1 TABLET AT BEDTIME 90 tablet 3   No current facility-administered medications for this encounter.    No Known Allergies    Social History   Socioeconomic History  . Marital status: Widowed    Spouse name: Not on file  . Number of children: 2  . Years of education: Not on file  . Highest education level: Not on file  Occupational History  . 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 Smoking status: Former Smoker    Packs/day: 1.00    Years: 8.00    Pack years: 8.00    Types: Cigarettes    Quit date: 1958    Years since quitting: 64.0  . Smokeless tobacco: Never Used  . Tobacco comment: "quit smoking cigarettes in 1958"  Vaping Use  . Vaping Use: Never used  Substance and Sexual Activity  . Alcohol use: No    Alcohol/week: 0.0 standard drinks  . Drug use: No  . Sexual activity: Yes  Other Topics Concern  . Not on file  Social History Narrative   From [place].  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     Lives with girlfriend [name].  Widowed 12/2005.   Social Determinants of Health   Financial Resource Strain: Not on file  Food Insecurity: Not on file  Transportation Needs: Not on file  Physical Activity: Not on file  Stress: Not on file  Social  Connections: Not on file  Intimate Partner Violence: Not on file      Family History  Problem Relation Age of Onset  . Kidney disease Mother        Kidney failure  . Stroke Mother   . Diabetes Mother   . Heart disease Father        MI  . Arthritis Sister   . Cancer Sister        Throat  . Heart disease Sister        MI  . Diabetes Sister   . Prostate cancer Neg Hx   . Colon cancer Neg Hx     Vitals:   10/29/20 1112  BP: 138/72  Pulse: 80  SpO2: 94%  Weight: 82.5 kg (181 lb 12.8 oz)   Wt Readings from Last 3 Encounters:  10/29/20 82.5 kg (181 lb 12.8 oz)  09/09/20 82.5 kg (181 lb 12.8 oz)  08/31/20 84.4 kg (186 lb)   PHYSICAL EXAM: General:  Well appearing. No respiratory difficulty HEENT: normal Neck: supple. JVP~5-6. Carotids 2+ bilat; no bruits. No lymphadenopathy or thyromegaly appreciated. Cor: PMI nondisplaced. Regular rate & rhythm. 2/6 SEM RUSB murmur  Lungs: clear, diminished in bases Abdomen: colostomy bag present, soft, nontender, nondistended. No hepatosplenomegaly. No bruits or masses. Good bowel sounds.  Extremities: no cyanosis, clubbing, rash, trace ankle edema Neuro: alert & oriented x 3, cranial nerves grossly intact. moves all 4 extremities w/o difficulty. Affect pleasant.  ReDs Clip today: 36%  ASSESSMENT & PLAN:  1. Chronic systolic HF due to ischemic CM  - EF 40-45% echo 8/20 3+ MR - s/p MitraClip 3/21 - Echo 4/21: EF 45-50%, s/p A2P2 edge to edge repair with single NTW device. Trivial residual MR and mean gradient of 6 mm Hg. There is significant bidirectional shunting at transeptal puncture.  - NYHA class  III. Volume status improving. Reds Clip 36% (trending down from 47%-->41%). Wt stable-  - Continue lasix 80 mg bid for now.  - Continue Jardiance 10 mg daily.  - Continue hydralazine 25 tid. - Continue Imdur 60 mg daily.  - Continue Metoprolol 50 mg bid.  - Not on ARNI or spiro due to CKD IV. - Discussed low sodium diet, daily wts  and fluid restriction.  - Needs BMET, CBC, BNP (has follow up today with PCP) - Needs repeat BMET 7-10 days.  2. Mitral regurgitation - as above. S/p MitraClip. - F/u echo 4/21 showed trivial residual MR and mean gradient of 6 mm Hg.  3. HTN - Controlled on current regimen. - Check BMET today.   4. CKD IV  - Baseline creatinine ~2.4-2.7. - No ACE/ARNI. - Labs today.  5. CAD s/p re-do CABG - Cath 9/19 significant CAD but not amenable to PCI. - He denies CP.  - Continue medical therapy.  - On ASA,  blocker and statin.   6. Chronic AF - By ECG dates back to at least 7/18. Likely no role for DC-CV here. - Well rate controlled in the 60s. - Continue  blocker + apixaban.  Follow back with Dr. Haroldine Laws in 3 months.  [place], [place], FNP-BC  10/29/20 12:04 PM

## 2020-10-29 NOTE — Patient Instructions (Addendum)
Go to the lab on the way out.   If you have mychart we'll likely use that to update you.    Let me me know if you need help with your foot pain at night.   Call about an eye exam when possible.   I'll update your other docs.  Take care.  Glad to see you.

## 2020-11-01 DIAGNOSIS — J310 Chronic rhinitis: Secondary | ICD-10-CM | POA: Insufficient documentation

## 2020-11-01 NOTE — Assessment & Plan Note (Signed)
No chest pain.  Minimal edema.  No claudication.  Rare nitroglycerin use.  No change in medications at this point.  See notes on labs.  We will update cardiology.  Continue aspirin Eliquis furosemide hydralazine isosorbide metoprolol and Ranexa.

## 2020-11-01 NOTE — Assessment & Plan Note (Signed)
Continue ipratropium nasal spray and update me as needed.

## 2020-11-01 NOTE — Assessment & Plan Note (Signed)
With history of peripheral neuropathy.  He will update me if his foot symptoms worse.  Routine foot care discussed with patient.  Continue Jardiance and Lantus at baseline.  See notes on labs.  30 minutes were devoted to patient care in this encounter (this includes time spent reviewing the patient's file/history, interviewing and examining the patient, counseling/reviewing plan with patient).

## 2020-11-02 ENCOUNTER — Telehealth: Payer: Self-pay

## 2020-11-02 NOTE — Telephone Encounter (Signed)
Spoke with pt regarding Lab results and he understood.

## 2020-11-17 ENCOUNTER — Other Ambulatory Visit: Payer: Self-pay | Admitting: Cardiovascular Disease

## 2020-12-17 ENCOUNTER — Other Ambulatory Visit: Payer: Self-pay | Admitting: Cardiovascular Disease

## 2020-12-17 NOTE — Telephone Encounter (Signed)
36m, 82.3kg, scr 2.8 (10/29/20), lovw/thompson(03/12/20)

## 2020-12-23 ENCOUNTER — Other Ambulatory Visit: Payer: Self-pay | Admitting: Cardiovascular Disease

## 2020-12-23 ENCOUNTER — Ambulatory Visit (HOSPITAL_COMMUNITY)
Admission: RE | Admit: 2020-12-23 | Discharge: 2020-12-23 | Disposition: A | Discharge status: Home / Self Care | Payer: Medicare Other | Source: Ambulatory Visit | Attending: Cardiovascular Disease | Admitting: Cardiovascular Disease

## 2020-12-23 ENCOUNTER — Other Ambulatory Visit: Payer: Self-pay

## 2020-12-23 DIAGNOSIS — I6523 Occlusion and stenosis of bilateral carotid arteries: Secondary | ICD-10-CM | POA: Insufficient documentation

## 2021-01-08 ENCOUNTER — Other Ambulatory Visit: Payer: Self-pay

## 2021-01-08 MED ORDER — FREESTYLE LITE TEST VI STRP: ORAL_STRIP | 3 refills | Status: DC

## 2021-01-27 ENCOUNTER — Ambulatory Visit (HOSPITAL_BASED_OUTPATIENT_CLINIC_OR_DEPARTMENT_OTHER)
Admission: RE | Admit: 2021-01-27 | Discharge: 2021-01-27 | Disposition: A | Discharge status: Home / Self Care | Payer: Medicare Other | Source: Ambulatory Visit | Attending: Physician Assistant | Admitting: Physician Assistant

## 2021-01-27 ENCOUNTER — Encounter (HOSPITAL_COMMUNITY): Payer: Self-pay | Admitting: Internal Medicine

## 2021-01-27 ENCOUNTER — Ambulatory Visit (HOSPITAL_COMMUNITY)
Admission: RE | Admit: 2021-01-27 | Discharge: 2021-01-27 | Disposition: A | Discharge status: Home / Self Care | Payer: Medicare Other | Source: Ambulatory Visit | Attending: Internal Medicine | Admitting: Internal Medicine

## 2021-01-27 ENCOUNTER — Other Ambulatory Visit: Payer: Self-pay

## 2021-01-27 VITALS — BP 124/60 | HR 66 | Wt 179.4 lb

## 2021-01-27 DIAGNOSIS — Z791 Long term (current) use of non-steroidal anti-inflammatories (NSAID): Secondary | ICD-10-CM | POA: Insufficient documentation

## 2021-01-27 DIAGNOSIS — Z9049 Acquired absence of other specified parts of digestive tract: Secondary | ICD-10-CM | POA: Insufficient documentation

## 2021-01-27 DIAGNOSIS — Z7982 Long term (current) use of aspirin: Secondary | ICD-10-CM | POA: Diagnosis not present

## 2021-01-27 DIAGNOSIS — E785 Hyperlipidemia, unspecified: Secondary | ICD-10-CM | POA: Insufficient documentation

## 2021-01-27 DIAGNOSIS — I2510 Atherosclerotic heart disease of native coronary artery without angina pectoris: Secondary | ICD-10-CM

## 2021-01-27 DIAGNOSIS — Z87891 Personal history of nicotine dependence: Secondary | ICD-10-CM | POA: Insufficient documentation

## 2021-01-27 DIAGNOSIS — Z7984 Long term (current) use of oral hypoglycemic drugs: Secondary | ICD-10-CM | POA: Diagnosis not present

## 2021-01-27 DIAGNOSIS — I5042 Chronic combined systolic (congestive) and diastolic (congestive) heart failure: Secondary | ICD-10-CM

## 2021-01-27 DIAGNOSIS — Z95818 Presence of other cardiac implants and grafts: Secondary | ICD-10-CM

## 2021-01-27 DIAGNOSIS — Z7901 Long term (current) use of anticoagulants: Secondary | ICD-10-CM | POA: Insufficient documentation

## 2021-01-27 DIAGNOSIS — I482 Chronic atrial fibrillation, unspecified: Secondary | ICD-10-CM | POA: Diagnosis not present

## 2021-01-27 DIAGNOSIS — I130 Hypertensive heart and chronic kidney disease with heart failure and stage 1 through stage 4 chronic kidney disease, or unspecified chronic kidney disease: Secondary | ICD-10-CM | POA: Diagnosis not present

## 2021-01-27 DIAGNOSIS — Z9889 Other specified postprocedural states: Secondary | ICD-10-CM

## 2021-01-27 DIAGNOSIS — I5032 Chronic diastolic (congestive) heart failure: Secondary | ICD-10-CM | POA: Diagnosis not present

## 2021-01-27 DIAGNOSIS — I779 Disorder of arteries and arterioles, unspecified: Secondary | ICD-10-CM | POA: Diagnosis not present

## 2021-01-27 DIAGNOSIS — E1122 Type 2 diabetes mellitus with diabetic chronic kidney disease: Secondary | ICD-10-CM | POA: Diagnosis not present

## 2021-01-27 DIAGNOSIS — Z794 Long term (current) use of insulin: Secondary | ICD-10-CM | POA: Diagnosis not present

## 2021-01-27 DIAGNOSIS — Z951 Presence of aortocoronary bypass graft: Secondary | ICD-10-CM | POA: Insufficient documentation

## 2021-01-27 DIAGNOSIS — Z8719 Personal history of other diseases of the digestive system: Secondary | ICD-10-CM | POA: Insufficient documentation

## 2021-01-27 DIAGNOSIS — I252 Old myocardial infarction: Secondary | ICD-10-CM | POA: Diagnosis not present

## 2021-01-27 DIAGNOSIS — N184 Chronic kidney disease, stage 4 (severe): Secondary | ICD-10-CM | POA: Insufficient documentation

## 2021-01-27 DIAGNOSIS — I480 Paroxysmal atrial fibrillation: Secondary | ICD-10-CM | POA: Diagnosis not present

## 2021-01-27 DIAGNOSIS — I5022 Chronic systolic (congestive) heart failure: Secondary | ICD-10-CM | POA: Diagnosis not present

## 2021-01-27 DIAGNOSIS — E1142 Type 2 diabetes mellitus with diabetic polyneuropathy: Secondary | ICD-10-CM | POA: Diagnosis not present

## 2021-01-27 DIAGNOSIS — I340 Nonrheumatic mitral (valve) insufficiency: Secondary | ICD-10-CM | POA: Diagnosis not present

## 2021-01-27 DIAGNOSIS — Z79899 Other long term (current) drug therapy: Secondary | ICD-10-CM | POA: Insufficient documentation

## 2021-01-27 DIAGNOSIS — I4510 Unspecified right bundle-branch block: Secondary | ICD-10-CM | POA: Diagnosis not present

## 2021-01-27 DIAGNOSIS — I080 Rheumatic disorders of both mitral and aortic valves: Secondary | ICD-10-CM | POA: Insufficient documentation

## 2021-01-27 DIAGNOSIS — Z8249 Family history of ischemic heart disease and other diseases of the circulatory system: Secondary | ICD-10-CM | POA: Insufficient documentation

## 2021-01-27 DIAGNOSIS — I255 Ischemic cardiomyopathy: Secondary | ICD-10-CM | POA: Diagnosis not present

## 2021-01-27 DIAGNOSIS — I25810 Atherosclerosis of coronary artery bypass graft(s) without angina pectoris: Secondary | ICD-10-CM | POA: Insufficient documentation

## 2021-01-27 LAB — CBC
HCT: 44.4 % (ref 39.0–52.0)
Hemoglobin: 13.9 g/dL (ref 13.0–17.0)
MCH: 31 pg (ref 26.0–34.0)
MCHC: 31.3 g/dL (ref 30.0–36.0)
MCV: 99.1 fL (ref 80.0–100.0)
Platelets: 148 10*3/uL — ABNORMAL LOW (ref 150–400)
RBC: 4.48 MIL/uL (ref 4.22–5.81)
RDW: 15 % (ref 11.5–15.5)
WBC: 8.2 10*3/uL (ref 4.0–10.5)
nRBC: 0 % (ref 0.0–0.2)

## 2021-01-27 LAB — BASIC METABOLIC PANEL
Anion gap: 7 (ref 5–15)
BUN: 29 mg/dL — ABNORMAL HIGH (ref 8–23)
CO2: 31 mmol/L (ref 22–32)
Calcium: 9.7 mg/dL (ref 8.9–10.3)
Chloride: 98 mmol/L (ref 98–111)
Creatinine, Ser: 2.45 mg/dL — ABNORMAL HIGH (ref 0.61–1.24)
GFR, Estimated: 25 mL/min — ABNORMAL LOW (ref >60)
Glucose, Bld: 195 mg/dL — ABNORMAL HIGH (ref 70–99)
Potassium: 4.2 mmol/L (ref 3.5–5.1)
Sodium: 136 mmol/L (ref 135–145)

## 2021-01-27 LAB — BRAIN NATRIURETIC PEPTIDE: B Natriuretic Peptide: 537 pg/mL — ABNORMAL HIGH (ref 0.0–100.0)

## 2021-01-27 LAB — ECHOCARDIOGRAM COMPLETE
Area-P 1/2: 2.37 cm2
Calc EF: 45.6 %
MV VTI: 0.92 cm2
S' Lateral: 3.7 cm
Single Plane A2C EF: 45.4 %
Single Plane A4C EF: 45.3 %

## 2021-01-27 NOTE — Progress Notes (Signed)
ADVANCED HF CLINIC PROGRESS NOTE   Primary Cardiologist: Gwenlyn Found [facility]: Dr. Haroldine Laws   HPI: [name] is an 87 y.o. male with h/o CAD s/p re-do CABG ([id number]), carotid disease s/p CEA, HTN, HLN, chronic AF, chronic GI bleeding s/p left hemicolectomy.   He has had several caths over the past 5 years with stenting of SVG to OM on 2015. Last cath 9/19 newly occluded SVG to RCA and high-grade OM1 lesion which was treated medically due to unfavorable anatomy.   I saw him for the first time in 1/21 and felt MR was the major issue.   Eventually underwent RHC and TEE and followed by MitraClip (02/06/20).  Had recent f/u 10/18 and endorsed increased dyspnea w/ NYHA class III symptoms and elevated ReDs clip reading of 47% in the setting of dietary indiscretion w/ sodium. Diuretics were increased. He was instructed to increase lasix to 80 mg bid x 2 days, followed by 80 mg once daily.   Follow up on 10/27. Wt down 5 lb. ReDs 41%, and was feeling symptomatically better. Continued lasix 80 mg bid dosing.  He presents today for f/u. Says he is feeling good. Able to walk 400 yards before he gets mildly SOB. Slight LE edema at night but resolves in am. No orthopnea or PND. No CP.   Echo today 01/27/21: EF ~50%, mitral clip mean gradient of 3 mm Hg. There is significant bidirectional shunting at transeptal puncture. Mild AS  ReDS 49%   Cardiac Studies  Echo 4/21: EF 45-50%, s/p A2P2 edge to edge repair with single NTW device. Trivial residual MR and mean gradient of 6 mm Hg. There is significant bidirectional shunting at transeptal puncture.   Echo 8/20  1. LVEF 40-45% akinesis of the mid and apical inferior walls and hypokinesis of the inferolateral walls consistent with prior infarct in the RCA or left Cx territory.  2. The right ventricle has moderately reduced systolic function. The cavity was normal. There is no increase in right ventricular wall thickness. Right ventricular systolic  pressure is severely elevated at 70mmHg .  3. Moderate MR  4. The aortic valve has an indeterminate number of cusps. Moderate thickening of the aortic valve. Severe calcifcation of the aortic valve. Aortic valve regurgitation is mild by color flow Doppler.  RHC 12/2019  RA = 10 RV = 58/8 PA = 58/19 (33) PCW = 20 (v waves to 35)  Fick cardiac output/index = 4.3/2.1 Thermo CO/CI = 3.2/1.7 PVR = 3.1 (Fick) 4.0 (thermo) Ao sat = 99% PA sat = 64%, 68%  Past Medical History:  Diagnosis Date  . Age-related macular degeneration, wet, both eyes ([place])   . Anemia    Secondary to acute blood loss  . Anginal pain ([place])    last chest pain in Feb 2021  . Arthritis    "mild in hands, knees, ankles" (11/13/2015)  . Atrial fibrillation ([place])    Consideration was given for atrial flutter ablation, but patient developed atrial fibrillation. Cardioversion was done. Dr. Caryl Comes decided to watch him clinically. November, 2011  . Atrial flutter [facility]) 07/2010   September, 2011   Hospital with PNA and cath done.Marland KitchenMarland KitchenCoumadin.  Atrial flutter ablation planned, but  pt. then had atrial fibrillation,/outpatient conversion 09/08/10..NSR..plan to follow..Dr. Caryl Comes  . CAD (coronary artery disease)    Catheterization, September, 2011,  grafts patent from redo CABG,, medical therapy of coronary disease, consideration to proceeding with atrial flutter ablation  . Carotid artery disease ([place])  Doppler 09/18/2009 - 49% bilateral stenoses  . Carotid artery disease ([name])    49% bilateral, Doppler, November, 2010  . CHF (congestive heart failure) ([place])   . Chronic kidney disease (CKD), stage III (moderate) ([name])   . Diabetic peripheral neuropathy ([name])    feet  . Diverticulosis of colon with hemorrhage 2009   several unit diverticular bleed   . Erectile dysfunction    Mild  . Gout   . Hearing loss    wears hearing aids  . History of blood transfusion "several"   related to diverticular bleeding  . Hyperlipidemia    . Hypertension   . Mitral regurgitation    Mild, echo, September, 2011  . NSTEMI (non-ST elevated myocardial infarction) ([place]) 07/2010   at [facility] with repeat cath, rec medical mgmt   . OSA on CPAP    uses CPAP nightly  . Personal history of colonic polyps   . PNA (pneumonia) 9/11   NSTEMI at [facility] with repeat cath, rec medical mgmt   . RBBB (right bundle branch block)   . S/P mitral valve repair 02/06/2020   s/p TEER with a single MitraClip NTW placed on A2/P2  . Shoulder pain    "positional; better now" (11/13/2015)  . Skin cancer    R lower leg, per derm 2012  . Type II diabetes mellitus ([place])     Current Outpatient Medications  Medication Sig Dispense Refill  . allopurinol (ZYLOPRIM) 100 MG tablet Take 0.5 tablets (50 mg total) by mouth every Monday, Wednesday, and Friday. 30 tablet 3  . aspirin 81 MG tablet Take 81 mg by mouth daily.    . BD INSULIN SYRINGE ULTRAFINE 31G X 5/16" 0.3 ML MISC USE DAILY AS INSTRUCTED 100 each 2  . cetirizine (ZYRTEC) 10 MG tablet Take 10 mg by mouth daily as needed for allergies.    . Cholecalciferol (VITAMIN D) 1000 UNITS capsule Take 1,000 Units by mouth daily.     . diclofenac Sodium (VOLTAREN) 1 % GEL Apply 2 g topically 4 (four) times daily. 50 g 0  . ELIQUIS 2.5 MG TABS tablet TAKE 1 TABLET TWICE A DAY 180 tablet 1  . empagliflozin (JARDIANCE) 10 MG TABS tablet Take 1 tablet (10 mg total) by mouth daily before breakfast. 90 tablet 3  . fish oil-omega-3 fatty acids 1000 MG capsule Take 1 g by mouth daily.    . furosemide (LASIX) 80 MG tablet Take 1 tablet (80 mg total) by mouth 2 (two) times daily. 180 tablet 3  . glucose blood (FREESTYLE LITE) test strip USE TO TEST BLOOD SUGAR ONCE DAILY AND AS NEEDED FOR DIABETES MELLITUS 300 each 3  . hydrALAZINE (APRESOLINE) 25 MG tablet Take 25 mg by mouth in the morning and at bedtime.    . insulin glargine (LANTUS) 100 UNIT/ML injection Inject 0.25-0.3 mLs (25-30 Units total) into the skin daily.    [name]  ipratropium (ATROVENT) 0.03 % nasal spray Place 2 sprays into both nostrils 2 (two) times daily as needed for rhinitis. 90 mL 3  . isosorbide mononitrate (IMDUR) 60 MG 24 hr tablet Take 60 mg by mouth in the morning and at bedtime.    . magnesium oxide (MAG-OX) 400 MG tablet Take 400 mg by mouth daily.    . metoprolol tartrate (LOPRESSOR) 50 MG tablet TAKE 1 TABLET TWICE A DAY 180 tablet 0  . Multiple Vitamin (MULTIVITAMIN WITH MINERALS) TABS Take 1 tablet by mouth daily.    . nitroGLYCERIN (NITROSTAT) 0.4  MG SL tablet Place 1 tablet (0.4 mg total) under the tongue every 5 (five) minutes as needed for chest pain. 100 tablet 1  . Potassium Gluconate 595 MG CAPS Take 595 mg by mouth daily.    . ranolazine (RANEXA) 500 MG 12 hr tablet TAKE 1 TABLET TWICE A DAY 180 tablet 3  . rosuvastatin (CRESTOR) 40 MG tablet TAKE 1 TABLET AT BEDTIME 90 tablet 3   No current facility-administered medications for this encounter.    No Known Allergies    Social History   Socioeconomic History  . Marital status: Widowed    Spouse name: Not on file  . Number of children: 2  . Years of education: Not on file  . Highest education level: Not on file  Occupational History  . 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 Smoking status: Former Smoker    Packs/day: 1.00    Years: 8.00    Pack years: 8.00    Types: Cigarettes    Quit date: 1958    Years since quitting: 64.2  . Smokeless tobacco: Never Used  . Tobacco comment: "quit smoking cigarettes in 1958"  Vaping Use  . Vaping Use: Never used  Substance and Sexual Activity  . Alcohol use: No    Alcohol/week: 0.0 standard drinks  . Drug use: No  . Sexual activity: Yes  Other Topics Concern  . Not on file  Social History Narrative   From [place].  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     Lives with girlfriend [name].  Widowed 12/2005.   Social Determinants of Health    Financial Resource Strain: Not on file  Food Insecurity: Not on file  Transportation Needs: Not on file  Physical Activity: Not on file  Stress: Not on file  Social Connections: Not on file  Intimate Partner Violence: Not on file      Family History  Problem Relation Age of Onset  . Kidney disease Mother        Kidney failure  . Stroke Mother   . Diabetes Mother   . Heart disease Father        MI  . Arthritis Sister   . Cancer Sister        Throat  . Heart disease Sister        MI  . Diabetes Sister   . Prostate cancer Neg Hx   . Colon cancer Neg Hx     Vitals:   01/27/21 1101  BP: 124/60  Pulse: 66  SpO2: 98%  Weight: 81.4 kg (179 lb 6.4 oz)   Wt Readings from Last 3 Encounters:  10/29/20 82.5 kg (181 lb 12.8 oz)  10/29/20 82.3 kg (181 lb 8 oz)  09/09/20 82.5 kg (181 lb 12.8 oz)   PHYSICAL EXAM: General:  Well appearing. No resp difficulty HEENT: normal Neck: supple. JVP 6-7. Carotids 2+ bilat; no bruits. No lymphadenopathy or thryomegaly appreciated. Cor: PMI nondisplaced. Irregular rate & rhythm. 2/6 AS s2 crisp Lungs: clear Abdomen: soft, nontender, nondistended. No hepatosplenomegaly. No bruits or masses. Good bowel sounds. Extremities: no cyanosis, clubbing, rash, edema Neuro: alert & orientedx3, cranial nerves grossly intact. moves all 4 extremities w/o difficulty. Affect pleasant  ReDs Clip today: 36%  ECG: AF 59 RBBB/LAFB Personally reviewed   ASSESSMENT & PLAN:  1. Chronic systolic HF due to ischemic CM  - EF 40-45% echo 8/20 3+ MR -  s/p MitraClip 3/21 - Echo 4/21: EF 45-50%, s/p A2P2 edge to edge repair with single NTW device. Trivial residual MR and mean gradient of 6 mm Hg. There is significant bidirectional shunting at transeptal puncture.  Echo today 01/27/21: EF ~50%, mitral clip mean gradient of 3 mm Hg. Triv MR There is significant bidirectional shunting at transeptal puncture. Mild AS - Improved NYHA class II-III - Volume status ok  on exam however ReDS 49% - Continue lasix 80 po bid. Can take extra as needed.   - Continue Jardiance 10 mg daily.  - Continue hydralazine 25 tid. - Off Imdur  - Continue Metoprolol 50 mg bid.  - Not on ARNI or spiro due to CKD IV. - Labs today  2. Mitral regurgitation -Much improved s/p MitraClip. - Echo as above  3. HTN - Blood pressure well controlled. Continue current regimen.  4. CKD IV  - Baseline creatinine ~2.4-2.8 - No ACE/ARNI. - Labs today.  5. CAD s/p re-do CABG - Cath 9/19 significant CAD but not amenable to PCI. - No s/s angina - Continue medical therapy.  - On ASA,  blocker and statin.   6. Chronic AF - Rate controlled - Continue  blocker + apixaban.  Glori Bickers, MD  12:37 AM

## 2021-01-27 NOTE — Progress Notes (Signed)
  Echocardiogram 2D Echocardiogram has been performed.  [name] 01/27/2021, 11:00 AM

## 2021-01-27 NOTE — Addendum Note (Signed)
Encounter addended by: Scarlette Calico, RN on: 01/27/2021 11:53 AM  Actions taken: Visit diagnoses modified, Order list changed, Diagnosis association updated, Clinical Note Signed, Charge Capture section accepted

## 2021-01-27 NOTE — Progress Notes (Signed)
ReDS Vest / Clip - 01/27/21 1100      ReDS Vest / Clip   Station Marker C    Ruler Value 29.5    ReDS Value Range High volume overload    ReDS Actual Value 49

## 2021-01-27 NOTE — Patient Instructions (Signed)
Labs done today, we will call you for abnormal results  Please call our office in October to schedule your follow up for November  If you have any questions or concerns before your next appointment please send [place] a message through [place] or call our office at [phone].    TO LEAVE A MESSAGE FOR THE NURSE SELECT OPTION 2, PLEASE LEAVE A MESSAGE INCLUDING: . YOUR NAME . DATE OF BIRTH . CALL BACK NUMBER . REASON FOR CALL**this is important as we prioritize the call backs  [place] AS LONG AS YOU CALL BEFORE 4:00 PM  At the [facility], you and your health needs are our priority. As part of our continuing mission to provide you with exceptional heart care, we have created designated Provider Care Teams. These Care Teams include your primary Cardiologist (physician) and Advanced Practice Providers (APPs- Physician Assistants and Nurse Practitioners) who all work together to provide you with the care you need, when you need it.   You may see any of the following providers on your designated Care Team at your next follow up: [name] Dr Glori Bickers . Dr Loralie Champagne . Dr Vickki Muff . Darrick Grinder, NP . Lyda Jester, [place] . Audry Riles, PharmD   Please be sure to bring in all your medications bottles to every appointment.

## 2021-01-28 NOTE — Addendum Note (Signed)
Encounter addended by: Eileen Stanford, PA-C on: 01/28/2021 10:57 AM  Actions taken: Flowsheet accepted

## 2021-02-03 ENCOUNTER — Other Ambulatory Visit: Payer: Self-pay

## 2021-02-03 ENCOUNTER — Encounter: Payer: Self-pay | Admitting: Physician Assistant

## 2021-02-03 ENCOUNTER — Ambulatory Visit (INDEPENDENT_AMBULATORY_CARE_PROVIDER_SITE_OTHER): Payer: Medicare Other | Admitting: Physician Assistant

## 2021-02-03 VITALS — BP 130/70 | HR 74 | Ht 67.0 in | Wt 180.0 lb

## 2021-02-03 DIAGNOSIS — Z9889 Other specified postprocedural states: Secondary | ICD-10-CM

## 2021-02-03 DIAGNOSIS — Z95818 Presence of other cardiac implants and grafts: Secondary | ICD-10-CM

## 2021-02-03 NOTE — Progress Notes (Signed)
6 Minute Walk Test Results  Patient: [name] Date:  02/03/2021   Supplemental O2 during test? NO      Baseline   End  Time   1324    1330 Heartrate  74    69 Dyspnea  None    None Fatigue  None    None O2 sat   98%    97% Blood pressure 130/70    150/74   Patient ambulated at a brisk pace for a total distance of 678 feet with 0 stops.  Ambulation was limited primarily due to nothing  Overall the test was tolerated well

## 2021-02-15 ENCOUNTER — Other Ambulatory Visit: Payer: Self-pay | Admitting: Cardiovascular Disease

## 2021-02-22 DIAGNOSIS — N183 Chronic kidney disease, stage 3 unspecified: Secondary | ICD-10-CM | POA: Diagnosis not present

## 2021-02-25 ENCOUNTER — Other Ambulatory Visit: Payer: Self-pay

## 2021-02-25 MED ORDER — HYDRALAZINE HCL 25 MG PO TABS: 25.0000 mg | ORAL_TABLET | Freq: Two times a day (BID) | ORAL | 3 refills | Status: DC

## 2021-03-01 DIAGNOSIS — E889 Metabolic disorder, unspecified: Secondary | ICD-10-CM | POA: Diagnosis not present

## 2021-03-01 DIAGNOSIS — I129 Hypertensive chronic kidney disease with stage 1 through stage 4 chronic kidney disease, or unspecified chronic kidney disease: Secondary | ICD-10-CM | POA: Diagnosis not present

## 2021-03-01 DIAGNOSIS — D631 Anemia in chronic kidney disease: Secondary | ICD-10-CM | POA: Diagnosis not present

## 2021-03-01 DIAGNOSIS — M9080 Osteopathy in diseases classified elsewhere, unspecified site: Secondary | ICD-10-CM | POA: Diagnosis not present

## 2021-03-01 DIAGNOSIS — N184 Chronic kidney disease, stage 4 (severe): Secondary | ICD-10-CM | POA: Diagnosis not present

## 2021-03-15 IMAGING — CR DG CHEST 2V
2 series · 2 of 2 positions shown · non-contrast
Comparison: 01/20/2020

CLINICAL DATA: Preoperative evaluation for MVR; history atrial
fibrillation/flutter, coronary artery disease post NSTEMI at, CHF,

EXAM:
CHEST - 2 VIEW

[w chest pa]
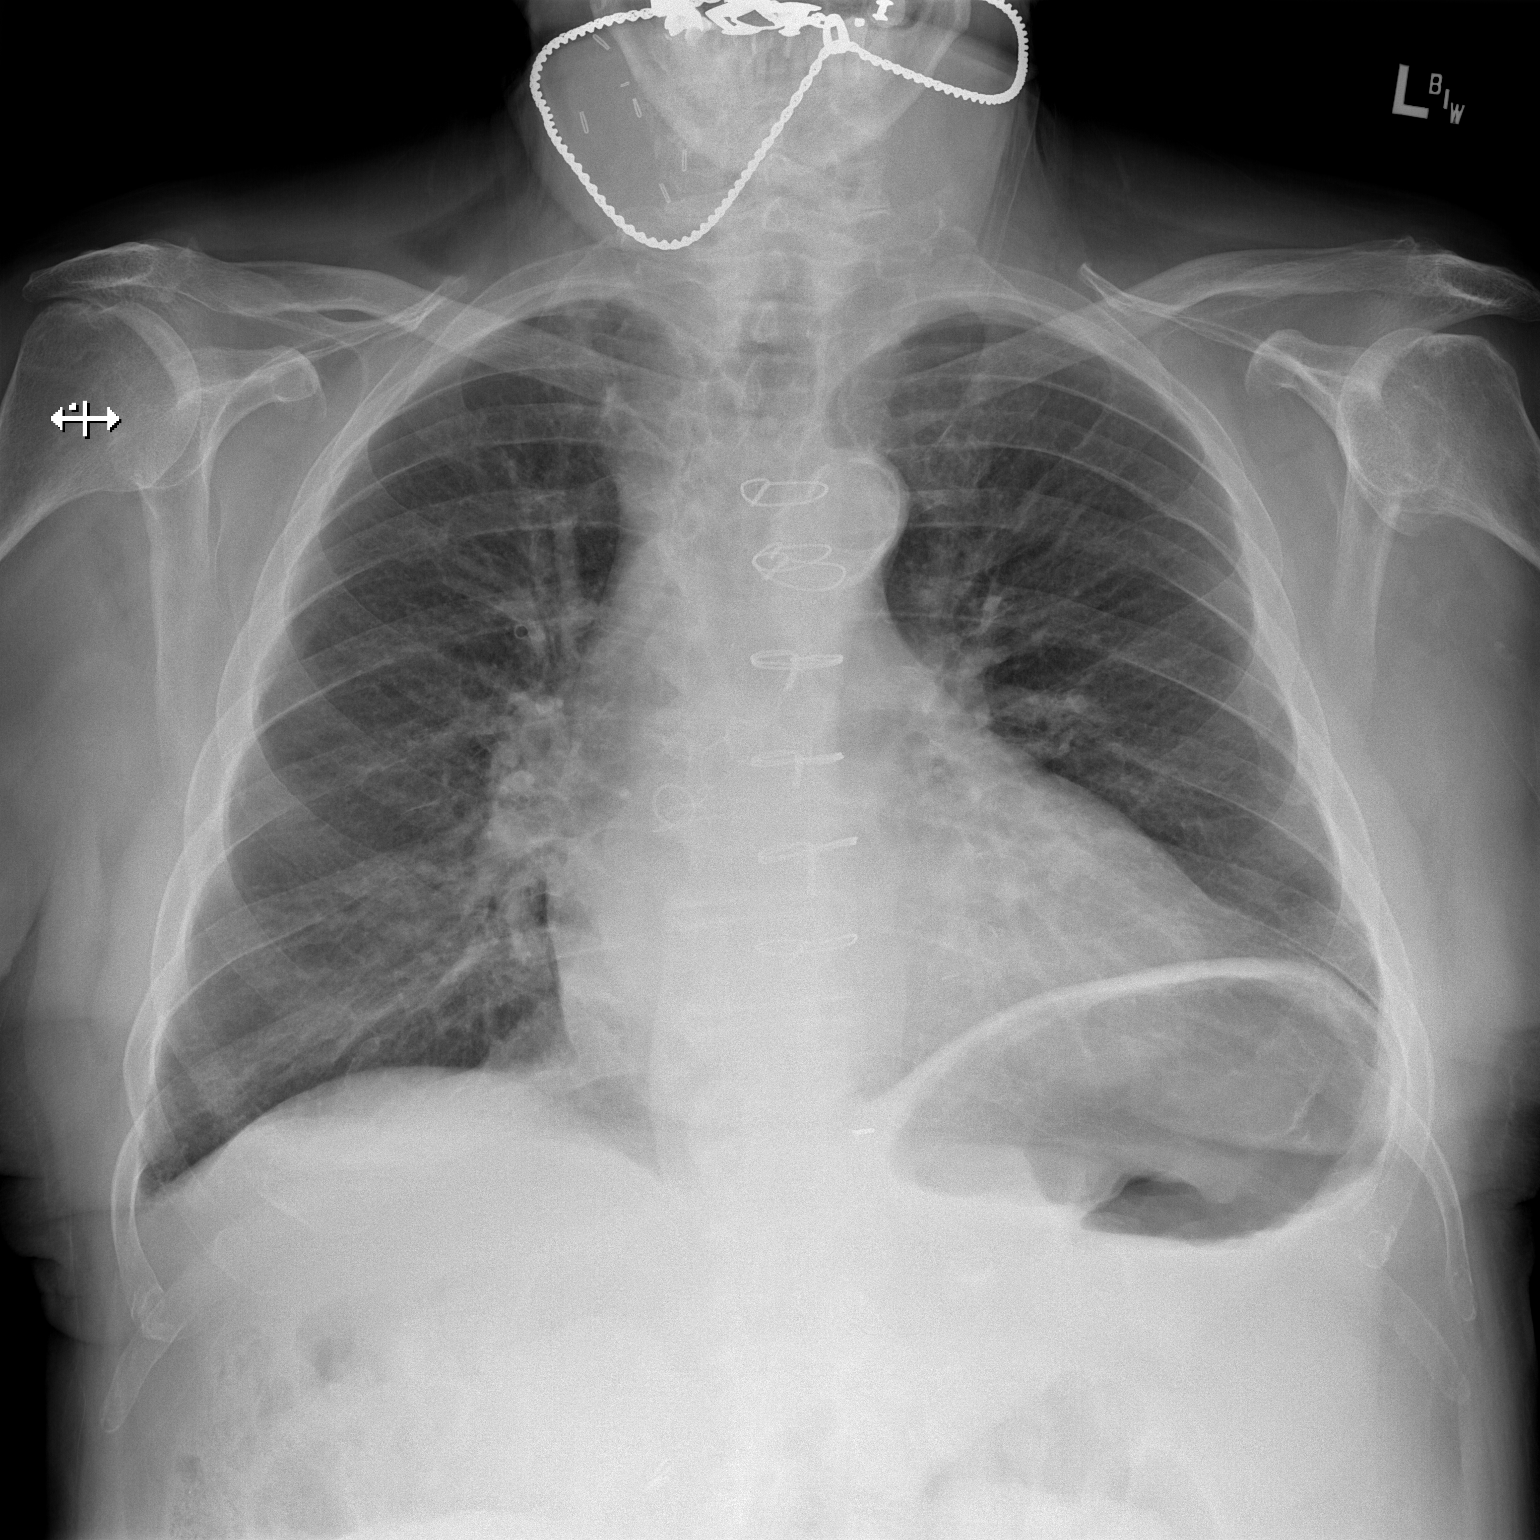

[w chest lat]
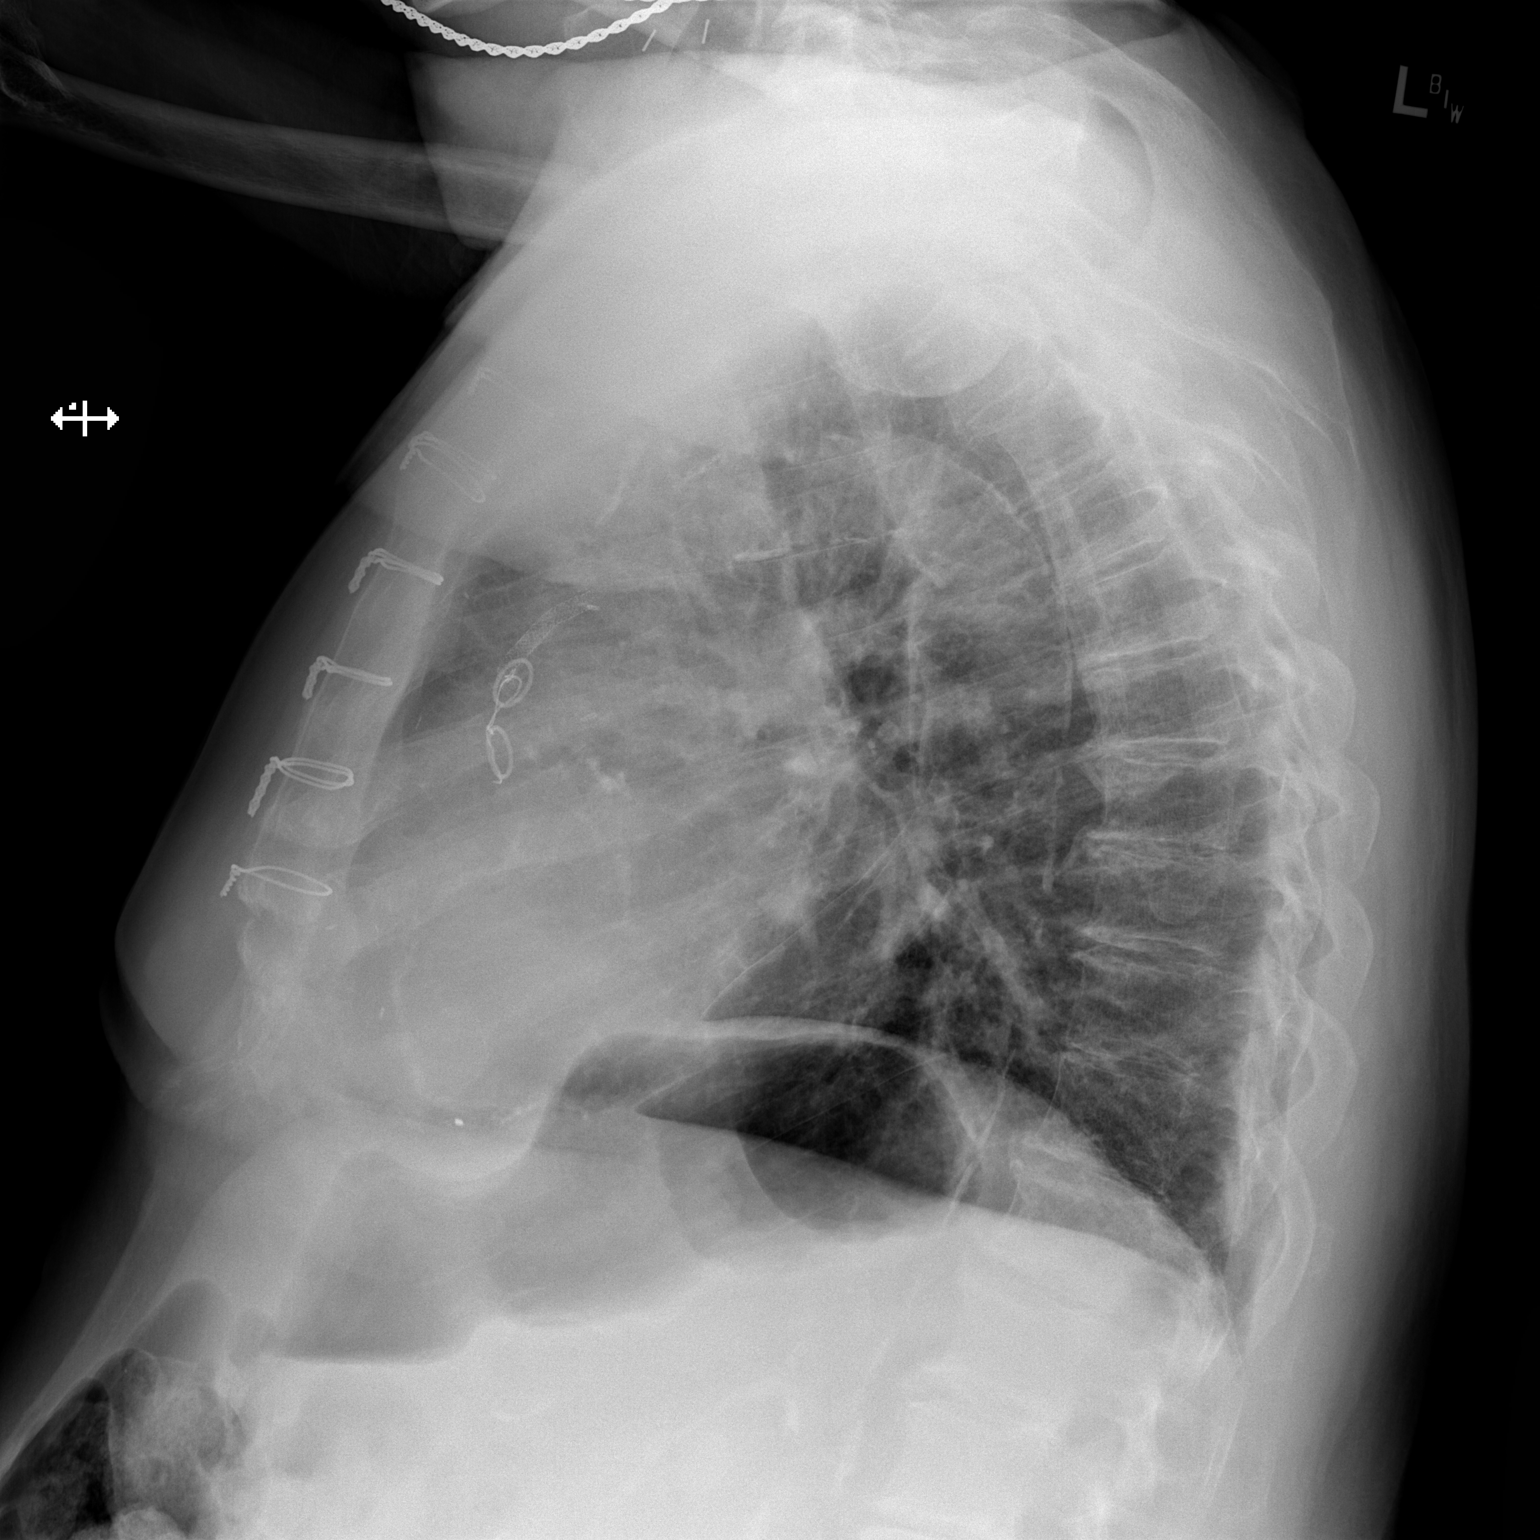

[2 of 2 positions shown; findings below may reference images not displayed]

FINDINGS: Enlargement of cardiac silhouette post CABG and coronary stenting.

Slight pulmonary vascular congestion.

Mediastinal contours normal with atherosclerotic calcifications
aorta.

Chronic bronchitic changes without pulmonary infiltrate, pleural
effusion or pneumothorax.

Bones demineralized with scattered degenerative disc disease changes
of the thoracic spine and LEFT glenohumeral degenerative changes.
IMPRESSION: Enlargement of cardiac silhouette post CABG and coronary stenting
with slight pulmonary vascular congestion.

Chronic bronchitic changes.

## 2021-03-30 DIAGNOSIS — C44519 Basal cell carcinoma of skin of other part of trunk: Secondary | ICD-10-CM | POA: Diagnosis not present

## 2021-03-30 DIAGNOSIS — L570 Actinic keratosis: Secondary | ICD-10-CM | POA: Diagnosis not present

## 2021-03-30 DIAGNOSIS — Z1283 Encounter for screening for malignant neoplasm of skin: Secondary | ICD-10-CM | POA: Diagnosis not present

## 2021-03-30 DIAGNOSIS — X32XXXD Exposure to sunlight, subsequent encounter: Secondary | ICD-10-CM | POA: Diagnosis not present

## 2021-04-20 ENCOUNTER — Other Ambulatory Visit: Payer: Self-pay | Admitting: Family Medicine

## 2021-05-17 ENCOUNTER — Other Ambulatory Visit: Payer: Self-pay | Admitting: Cardiovascular Disease

## 2021-05-18 DIAGNOSIS — Z85828 Personal history of other malignant neoplasm of skin: Secondary | ICD-10-CM | POA: Diagnosis not present

## 2021-05-18 DIAGNOSIS — X32XXXD Exposure to sunlight, subsequent encounter: Secondary | ICD-10-CM | POA: Diagnosis not present

## 2021-05-18 DIAGNOSIS — Z08 Encounter for follow-up examination after completed treatment for malignant neoplasm: Secondary | ICD-10-CM | POA: Diagnosis not present

## 2021-05-18 DIAGNOSIS — C44519 Basal cell carcinoma of skin of other part of trunk: Secondary | ICD-10-CM | POA: Diagnosis not present

## 2021-05-18 DIAGNOSIS — L570 Actinic keratosis: Secondary | ICD-10-CM | POA: Diagnosis not present

## 2021-06-15 ENCOUNTER — Other Ambulatory Visit: Payer: Self-pay | Admitting: Physician Assistant

## 2021-06-15 NOTE — Telephone Encounter (Addendum)
Prescription refill request for Eliquis received.  Indication: afib  Last office visit: Bensimhon, 01/27/2021 Scr: 2.45, 01/27/2021 Age: 87 yo  Weight: 81.6 kg   Refill sent.

## 2021-06-24 ENCOUNTER — Other Ambulatory Visit: Payer: Self-pay | Admitting: Cardiovascular Disease

## 2021-07-01 ENCOUNTER — Emergency Department (HOSPITAL_COMMUNITY): Payer: Medicare Other

## 2021-07-01 ENCOUNTER — Other Ambulatory Visit: Payer: Self-pay

## 2021-07-01 ENCOUNTER — Telehealth: Payer: Self-pay

## 2021-07-01 ENCOUNTER — Emergency Department (HOSPITAL_COMMUNITY)
Admission: EM | Admit: 2021-07-01 | Discharge: 2021-07-01 | Disposition: A | Discharge status: Home / Self Care | Payer: Medicare Other | Attending: Emergency Medicine | Admitting: Emergency Medicine

## 2021-07-01 DIAGNOSIS — E1122 Type 2 diabetes mellitus with diabetic chronic kidney disease: Secondary | ICD-10-CM | POA: Diagnosis not present

## 2021-07-01 DIAGNOSIS — M25461 Effusion, right knee: Secondary | ICD-10-CM

## 2021-07-01 DIAGNOSIS — I5033 Acute on chronic diastolic (congestive) heart failure: Secondary | ICD-10-CM | POA: Diagnosis not present

## 2021-07-01 DIAGNOSIS — I4891 Unspecified atrial fibrillation: Secondary | ICD-10-CM | POA: Diagnosis not present

## 2021-07-01 DIAGNOSIS — N1832 Chronic kidney disease, stage 3b: Secondary | ICD-10-CM | POA: Insufficient documentation

## 2021-07-01 DIAGNOSIS — Z951 Presence of aortocoronary bypass graft: Secondary | ICD-10-CM | POA: Diagnosis not present

## 2021-07-01 DIAGNOSIS — M25561 Pain in right knee: Secondary | ICD-10-CM | POA: Diagnosis not present

## 2021-07-01 DIAGNOSIS — Z87891 Personal history of nicotine dependence: Secondary | ICD-10-CM | POA: Insufficient documentation

## 2021-07-01 DIAGNOSIS — Z7982 Long term (current) use of aspirin: Secondary | ICD-10-CM | POA: Diagnosis not present

## 2021-07-01 DIAGNOSIS — Z7984 Long term (current) use of oral hypoglycemic drugs: Secondary | ICD-10-CM | POA: Diagnosis not present

## 2021-07-01 DIAGNOSIS — X501XXA Overexertion from prolonged static or awkward postures, initial encounter: Secondary | ICD-10-CM | POA: Diagnosis not present

## 2021-07-01 DIAGNOSIS — I130 Hypertensive heart and chronic kidney disease with heart failure and stage 1 through stage 4 chronic kidney disease, or unspecified chronic kidney disease: Secondary | ICD-10-CM | POA: Diagnosis not present

## 2021-07-01 DIAGNOSIS — Z7901 Long term (current) use of anticoagulants: Secondary | ICD-10-CM | POA: Insufficient documentation

## 2021-07-01 DIAGNOSIS — W19XXXA Unspecified fall, initial encounter: Secondary | ICD-10-CM | POA: Diagnosis not present

## 2021-07-01 DIAGNOSIS — Z043 Encounter for examination and observation following other accident: Secondary | ICD-10-CM | POA: Diagnosis not present

## 2021-07-01 DIAGNOSIS — M1611 Unilateral primary osteoarthritis, right hip: Secondary | ICD-10-CM | POA: Diagnosis not present

## 2021-07-01 DIAGNOSIS — I2510 Atherosclerotic heart disease of native coronary artery without angina pectoris: Secondary | ICD-10-CM | POA: Insufficient documentation

## 2021-07-01 LAB — CBG MONITORING, ED: Glucose-Capillary: 266 mg/dL — ABNORMAL HIGH (ref 70–99)

## 2021-07-01 MED ORDER — TRAMADOL HCL 50 MG PO TABS: 50.0000 mg | ORAL_TABLET | Freq: Four times a day (QID) | ORAL | 0 refills | Status: DC | PRN

## 2021-07-01 NOTE — ED Provider Notes (Signed)
Emergency Medicine Provider Triage Evaluation Note  [name] , a 87 y.o. male  was evaluated in triage.  Pt complains of right knee and right hip pain.  No he was getting in a truck on Tuesday, lost his balance and hit the right side of his body against the truck.  Did not hit his head or fall.  This happened on Tuesday last week, he had increased swelling and pain last night prompting visit to the ED today.  He has been ambulatory and able to walk upstairs..  Review of Systems  Positive: KNEE PAIN, HIP PAIN Negative: SOB, SYNCOPE  Physical Exam  There were no vitals taken for this visit. Gen:   Awake, no distress   Resp:  Normal effort  MSK:   Moves extremities without difficulty  Other:  DP PT 2+.  No edema.  Medical Decision Making  Medically screening exam initiated at 10:43 AM.  Appropriate orders placed.  STIRLING ORTON was informed that the remainder of the evaluation will be completed by another provider, this initial triage assessment does not replace that evaluation, and the importance of remaining in the ED until their evaluation is complete.     Sherrill Raring, PA-C 07/01/21 1044    Valarie Merino, MD 07/03/21 [phone]

## 2021-07-01 NOTE — ED Notes (Signed)
Ace wrap applied to knee.

## 2021-07-01 NOTE — ED Provider Notes (Signed)
[facility] EMERGENCY DEPARTMENT Provider Note   CSN: [id number] Arrival date & time: 07/01/21  1039     History Chief Complaint  Patient presents with   Knee Pain    [name] is a 87 y.o. male.  Patient is an 87 year old male with a history of atrial fibrillation on Eliquis who presents with pain in his right knee.  He states he was getting into a truck 2 days ago and his foot "gave out".  He twisted it and it slid slightly under the car.  He did not fall down on it.  He said it felt okay initially but when he woke up the next morning it was swollen and painful.  He denies any other injuries.  He took some tramadol that he had at home with some improvement in symptoms.  He has been seen at [facility] sports medicine in the past and has had a prior knee effusion in the past.  He did not hit his head.  There are no other injuries.  He did not actually fall.      Past Medical History:  Diagnosis Date   Age-related macular degeneration, wet, both eyes ([place])    Anemia    Secondary to acute blood loss   Anginal pain ([name])    last chest pain in Feb 2021   Arthritis    "mild in hands, knees, ankles" (11/13/2015)   Atrial fibrillation ([place])    Consideration was given for atrial flutter ablation, but patient developed atrial fibrillation. Cardioversion was done. Dr. Caryl Comes decided to watch him clinically. November, 2011   Atrial flutter [facility]) 07/2010   September, 2011   Hospital with PNA and cath done.Marland KitchenMarland KitchenCoumadin.  Atrial flutter ablation planned, but  pt. then had atrial fibrillation,/outpatient conversion 09/08/10..NSR..plan to follow..Dr. Caryl Comes   CAD (coronary artery disease)    Catheterization, September, 2011,  grafts patent from redo CABG,, medical therapy of coronary disease, consideration to proceeding with atrial flutter ablation   Carotid artery disease ([place])    Doppler 09/18/2009 - 49% bilateral stenoses   Carotid artery disease ([name])    49% bilateral,  Doppler, November, 2010   CHF (congestive heart failure) ([place])    Chronic kidney disease (CKD), stage III (moderate) ([place])    Diabetic peripheral neuropathy ([place])    feet   Diverticulosis of colon with hemorrhage 2009   several unit diverticular bleed    Erectile dysfunction    Mild   Gout    Hearing loss    wears hearing aids   History of blood transfusion "several"   related to diverticular bleeding   Hyperlipidemia    Hypertension    Mitral regurgitation    Mild, echo, September, 2011   NSTEMI (non-ST elevated myocardial infarction) ([place]) 07/2010   at [facility] with repeat cath, rec medical mgmt    OSA on CPAP    uses CPAP nightly   Personal history of colonic polyps    PNA (pneumonia) 9/11   NSTEMI at [facility] with repeat cath, rec medical mgmt    RBBB (right bundle branch block)    S/P mitral valve repair 02/06/2020   s/p TEER with a single MitraClip NTW placed on A2/P2   Shoulder pain    "positional; better now" (11/13/2015)   Skin cancer    R lower leg, per derm 2012   Type II diabetes mellitus [facility])     Patient Active Problem List   Diagnosis Date Noted   Rhinitis 11/01/2020  Severe mitral insufficiency 02/06/2020   S/P mitral valve repair 02/06/2020   PAF (paroxysmal atrial fibrillation) ([name])    CAD (coronary artery disease) 08/02/2018   Macular degeneration 06/24/2018   Abdominal wall hernia 05/24/2017   History of lower GI bleeding    Pulmonary hypertension ([name])    Acute on chronic diastolic (congestive) heart failure ([place]) 11/20/2015   Gout 09/09/2014   Medicare annual wellness visit, initial 09/09/2014   RBBB (right bundle branch block)    Carotid artery disease ([name])    Hx of CABG    Non-rheumatic mitral regurgitation    CRI (chronic renal insufficiency), stage 4 (severe) ([place]) 03/24/2011   Peripheral sensory neuropathy due to type 2 diabetes mellitus ([place])    GERD    Sleep apnea    Insulin dependent type 2 diabetes mellitus ([place])    HLD (hyperlipidemia)     HTN (hypertension)     Past Surgical History:  Procedure Laterality Date   CARDIAC CATHETERIZATION  2006   Nuclear..slight lateral ischemia..medical therapy   CARDIAC CATHETERIZATION  08/04/2010   grafts patent from redo CABG...medical Rx and ablate Atrial flutter (LV not injected)    CARDIOVERSION  ~ 2010   CAROTID ENDARTERECTOMY Right 1994   CATARACT EXTRACTION W/ INTRAOCULAR LENS  IMPLANT, BILATERAL Bilateral    COLON RESECTION N/A 01/13/2016   Procedure: EXPLORATORY LAPAROTOMY, LEFT AND SIGMOID COLON REMOVAL;  Surgeon: Ralene Ok, MD;  Location: [place];  Service: General;  Laterality: N/A;  Extended open left hemicolectomy and sigmoidectomy    COLONOSCOPY N/A 11/14/2015   Procedure: COLONOSCOPY;  Surgeon: Manus Gunning, MD;  Location: [place];  Service: Gastroenterology;  Laterality: N/A;   COLONOSCOPY Left 01/05/2016   Procedure: COLONOSCOPY;  Surgeon: Manus Gunning, MD;  Location: [place];  Service: Gastroenterology;  Laterality: Left;  no sedation to start, moderate if needed   COLONOSCOPY W/ POLYPECTOMY     CORONARY ARTERY BYPASS GRAFT  1995; 2006   "X 3; X3"   CORONARY ARTERY BYPASS GRAFT  1995, 2006   DOPPLER ECHOCARDIOGRAPHY  08/2008   EF 60%   DOPPLER ECHOCARDIOGRAPHY  08/02/2010   65-70%   DOPPLER ECHOCARDIOGRAPHY  07/2010   MR mild   ESOPHAGOGASTRODUODENOSCOPY N/A 06/19/2014   Procedure: ESOPHAGOGASTRODUODENOSCOPY (EGD);  Surgeon: Jerene Bears, MD;  Location: [facility] ENDOSCOPY;  Service: Endoscopy;  Laterality: N/A;   ESOPHAGOGASTRODUODENOSCOPY N/A 11/14/2015   Procedure: ESOPHAGOGASTRODUODENOSCOPY (EGD);  Surgeon: Manus Gunning, MD;  Location: [place];  Service: Gastroenterology;  Laterality: N/A;   FLEXIBLE SIGMOIDOSCOPY N/A 11/16/2015   Procedure: FLEXIBLE SIGMOIDOSCOPY;  Surgeon: Manus Gunning, MD;  Location: [facility] ENDOSCOPY;  Service: Gastroenterology;  Laterality: N/A;   LAPAROSCOPIC CHOLECYSTECTOMY  2008   LEFT HEART  CATH N/A 06/14/2014   Procedure: LEFT HEART CATH;  Surgeon: Sinclair Grooms, MD;  Location: [facility] CATH LAB;  Service: Cardiovascular;  Laterality: N/A;   LEFT HEART CATH AND CORONARY ANGIOGRAPHY N/A 08/02/2018   Procedure: LEFT HEART CATH AND CORONARY ANGIOGRAPHY;  Surgeon: [name], MD;  Location: [place] CV LAB;  Service: Cardiovascular;  Laterality: N/A;   LEFT HEART CATHETERIZATION WITH CORONARY/GRAFT ANGIOGRAM N/A 06/11/2014   Procedure: LEFT HEART CATHETERIZATION WITH Beatrix Fetters;  Surgeon: Sinclair Grooms, MD;  Location: [facility] CATH LAB;  Service: Cardiovascular;  Laterality: N/A;   MITRAL VALVE REPAIR N/A 02/06/2020   Procedure: MITRAL VALVE REPAIR;  Surgeon: Sherren Mocha, MD;  Location: [place] CV LAB;  Service: Cardiovascular;  Laterality: N/A;  PERCUTANEOUS CORONARY STENT INTERVENTION (PCI-S) N/A 06/13/2014   Procedure: PERCUTANEOUS CORONARY STENT INTERVENTION (PCI-S);  Surgeon: Sinclair Grooms, MD;  Location: [facility] CATH LAB;  Service: Cardiovascular;  Laterality: N/A;   RIGHT HEART CATH N/A 01/06/2020   Procedure: RIGHT HEART CATH;  Surgeon: Jolaine Artist, MD;  Location: [place] CV LAB;  Service: Cardiovascular;  Laterality: N/A;   SKIN CANCER EXCISION Left 10/2015   calf   SKIN CANCER EXCISION Right 2014?   chest   TEE WITHOUT CARDIOVERSION N/A 01/06/2020   Procedure: TRANSESOPHAGEAL ECHOCARDIOGRAM (TEE);  Surgeon: Jolaine Artist, MD;  Location: [facility] ENDOSCOPY;  Service: Cardiovascular;  Laterality: N/A;   TONSILLECTOMY     VASECTOMY         Family History  Problem Relation Age of Onset   Kidney disease Mother        Kidney failure   Stroke Mother    Diabetes Mother    Heart disease Father        MI   Arthritis Sister    Cancer Sister        Throat   Heart disease Sister        MI   Diabetes Sister    Prostate cancer Neg Hx    Colon cancer Neg Hx     Social History   Tobacco Use   Smoking status: Former    Packs/day: 1.00     Years: 8.00    Pack years: 8.00    Types: Cigarettes    Quit date: 1958    Years since quitting: 64.6   Smokeless tobacco: Never   Tobacco comments:    "quit smoking cigarettes in 1958"  Vaping Use   Vaping Use: Never used  Substance Use Topics   Alcohol use: No    Alcohol/week: 0.0 standard drinks   Drug use: No    Home Medications Prior to Admission medications   Medication Sig Start Date End Date Taking? Authorizing Provider  traMADol (ULTRAM) 50 MG tablet Take 1 tablet (50 mg total) by mouth every 6 (six) hours as needed. 07/01/21  Yes Malvin Johns, MD  allopurinol (ZYLOPRIM) 100 MG tablet Take 0.5 tablets (50 mg total) by mouth every Monday, Wednesday, and Friday. 05/27/20   Tonia Ghent, MD  apixaban Arne [place]) 2.5 MG TABS tablet TAKE 1 TABLET TWICE A DAY 06/15/21   Eileen Stanford, PA-C  aspirin 81 MG tablet Take 81 mg by mouth daily.    [provider]  BD INSULIN SYRINGE ULTRAFINE 31G X 5/16" 0.3 ML MISC USE DAILY AS INSTRUCTED 12/02/15   Tonia Ghent, MD  cephALEXin [facility]) 500 MG capsule  07/20/20   [provider]  cetirizine (ZYRTEC) 10 MG tablet Take 10 mg by mouth daily as needed for allergies.    [provider]  Cholecalciferol (VITAMIN D) 1000 UNITS capsule Take 1,000 Units by mouth daily.     [provider]  diclofenac Sodium (VOLTAREN) 1 % GEL Apply 2 g topically 4 (four) times daily. 07/20/20   Loura Halt A, NP  empagliflozin (JARDIANCE) 10 MG TABS tablet Take 1 tablet (10 mg total) by mouth daily before breakfast. 07/17/20   Bensimhon, Shaune Pascal, MD  fish oil-omega-3 fatty acids 1000 MG capsule Take 1 g by mouth daily.    [provider]  furosemide (LASIX) 80 MG tablet Take 1 tablet (80 mg total) by mouth 2 (two) times daily. 09/09/20 12/08/20  Lyda Jester M, PA-C  glucose blood (  FREESTYLE LITE) test strip USE TO TEST BLOOD SUGAR ONCE DAILY AND AS NEEDED FOR DIABETES MELLITUS 01/08/21   Tonia Ghent,  MD  hydrALAZINE (APRESOLINE) 25 MG tablet Take 1 tablet (25 mg total) by mouth in the morning and at bedtime. 02/25/21   [name], MD  ipratropium (ATROVENT) 0.03 % nasal spray Place 2 sprays into both nostrils 2 (two) times daily as needed for rhinitis. 10/29/20   Tonia Ghent, MD  LANTUS 100 UNIT/ML injection INJECT 20 TO 25 UNITS AT BEDTIME 04/20/21   Tonia Ghent, MD  magnesium oxide (MAG-OX) 400 MG tablet Take 400 mg by mouth daily.    [provider]  metoprolol tartrate (LOPRESSOR) 50 MG tablet TAKE 1 TABLET TWICE A DAY 02/16/21   [name], MD  Multiple Vitamin (MULTIVITAMIN WITH MINERALS) TABS Take 1 tablet by mouth daily.    [provider]  nitroGLYCERIN (NITROSTAT) 0.4 MG SL tablet Place 1 tablet (0.4 mg total) under the tongue every 5 (five) minutes as needed for chest pain. 01/24/17   [name], MD  Potassium Gluconate 595 MG CAPS Take 595 mg by mouth daily.    [provider]  ranolazine (RANEXA) 500 MG 12 hr tablet TAKE 1 TABLET TWICE A DAY 05/17/21   [name], MD  rosuvastatin (CRESTOR) 40 MG tablet TAKE 1 TABLET AT BEDTIME 06/24/21   [name], MD    Allergies    Patient has no known allergies.  Review of Systems   Review of Systems  Constitutional:  Negative for chills, diaphoresis, fatigue and fever.  HENT:  Negative for congestion, rhinorrhea and sneezing.   Eyes: Negative.   Respiratory:  Negative for cough, chest tightness and shortness of breath.   Cardiovascular:  Negative for chest pain and leg swelling.  Gastrointestinal:  Negative for abdominal pain, blood in stool, diarrhea, nausea and vomiting.  Genitourinary:  Negative for difficulty urinating, flank pain, frequency and hematuria.  Musculoskeletal:  Positive for arthralgias and joint swelling. Negative for back pain and neck pain.  Skin:  Negative for rash and wound.  Neurological:  Negative for dizziness, speech difficulty, weakness,  numbness and headaches.   Physical Exam Updated Vital Signs BP (!) 145/70   Pulse 81   Temp 98.3 F (36.8 C) (Oral)   Resp 14   SpO2 99%   Physical Exam Constitutional:      Appearance: He is well-developed.  HENT:     Head: Normocephalic and atraumatic.  Cardiovascular:     Rate and Rhythm: Normal rate.  Pulmonary:     Effort: Pulmonary effort is normal.  Musculoskeletal:        General: Tenderness present.     Cervical back: Normal range of motion and neck supple.     Comments: Positive moderate effusion to the right knee with some warmth.  No erythema.  No gross deformity.  He has tenderness on movement of the knee and palpation of the knee.  Given this, ligament exam is limited although there is no gross instability.  There is no pain on range of motion of the hip or the knee.  There are some mild tenderness to the lateral aspect of the right hip which patient says he has had in the past.  He denies injuring this in the fall.  Skin:    General: Skin is warm and dry.  Neurological:     Mental Status: He is alert and oriented to person, place,  and time.    ED Results / Procedures / Treatments   Labs (all labs ordered are listed, but only abnormal results are displayed) Labs Reviewed  CBG MONITORING, ED - Abnormal; Notable for the following components:      Result Value   Glucose-Capillary 266 (*)    All other components within normal limits    EKG None  Radiology DG Knee Complete 4 Views Right  Result Date: 07/01/2021 CLINICAL DATA:  Fall EXAM: RIGHT KNEE - COMPLETE 4+ VIEW COMPARISON:  Right knee radiographs 09/19/2016 FINDINGS: There is no acute fracture or dislocation. Alignment is normal. There is mild medial and lateral tibiofemoral joint space narrowing with associated subchondral sclerosis and osteophytosis, similar to the prior study. A calcified lesion is again seen in the suprapatellar space, present on multiple prior studies. There is a moderate to large  effusion. Vascular calcifications and surgical clips are noted. IMPRESSION: 1. No acute fracture or dislocation. 2. Moderate to large joint effusion Electronically Signed   By: Valetta Mole M.D.   On: 07/01/2021 11:34   DG Hip Unilat W or Wo Pelvis 2-3 Views Right  Result Date: 07/01/2021 CLINICAL DATA:  Fall EXAM: DG HIP (WITH OR WITHOUT PELVIS) 2-3V RIGHT COMPARISON:  None. FINDINGS: There is no acute fracture or dislocation. Femoroacetabular alignment is maintained. There is mild degenerative change about the hips bilaterally. The SI joints and symphysis pubis are intact. Vascular calcifications noted. IMPRESSION: No acute fracture or dislocation. Electronically Signed   By: Valetta Mole M.D.   On: 07/01/2021 11:37    Procedures Procedures   Medications Ordered in ED Medications - No data to display  ED Course  I have reviewed the triage vital signs and the nursing notes.  Pertinent labs & imaging results that were available during my care of the patient were reviewed by me and considered in my medical decision making (see chart for details).    MDM Rules/Calculators/A&P                           Patient is a 87 year old male who presents with knee pain after he twisted it 2 days ago.  There is a moderate-sized effusion.  No gross instability.  No fracture is noted on x-ray imaging.  This was reviewed by me.  An Ace wrap was placed.  He was discharged home in good condition.  No other injuries were identified.  He was advised in symptomatic care.  He was given a prescription for tramadol as he feels like his home prescription is expired.  This was from several years ago.  He was encouraged to follow-up with his sports medicine doctor.  Return precautions were given. Final Clinical Impression(s) / ED Diagnoses Final diagnoses:  Acute pain of right knee  Effusion of right knee    Rx / DC Orders ED Discharge Orders          Ordered    traMADol (ULTRAM) 50 MG tablet  Every 6 hours PRN         07/01/21 1800             Malvin Johns, MD 07/01/21 1806

## 2021-07-01 NOTE — Telephone Encounter (Signed)
[facility] - Client TELEPHONE ADVICE RECORD AccessNurse Patient Name: [name] LE Gender: Male DOB: 04-03-1932 Age: 87 Y 4 M 1 D Return Phone Number: [phone] (Primary), [phone] (Secondary) Address: City/ [name] [address] Client [place] [facility] - Client Client Site [facility] - Day Physician Renford Dills - MD Contact Type Call Who Is Calling Patient / Member / Family / Caregiver Call Type Triage / Clinical Relationship To Patient Self Return Phone Number [phone] (Primary) Chief Complaint Leg Pain Reason for Call Symptomatic / Request for [name], his leg slid sideways getting out of the truck. Leg is swelled and can't put weight on it. His knee is swelled twice the size of his other knee. Translation No Nurse Assessment Nurse: Doyle Askew, RN, Beth Date/Time (Eastern Time): 07/01/2021 9:25:15 AM Confirm and document reason for call. If symptomatic, describe symptoms. ---Caller states he fell on Tuesday night, his right leg slid sideways getting out of the truck. Caller states right leg is swelled and can't put weight on it. Caller states right knee is swelled twice the size of his other knee. Does the patient have any new or worsening symptoms? ---Yes Will a triage be completed? ---Yes Related visit to physician within the last 2 weeks? ---No Does the PT have any chronic conditions? (i.e. diabetes, asthma, this includes High risk factors for pregnancy, etc.) ---Yes List chronic conditions. ---heart problems, kidney problems Is this a behavioral health or substance abuse call? ---No Guidelines Guideline Title Affirmed Question Affirmed Notes Nurse Date/Time (Eastern Time) Leg Injury Can't stand (bear weight) or walk Doyle Askew, Therapist, sports, [facility] 07/01/2021 9:28:56 AM Disp. Time Eilene Ghazi Time) Disposition  Final User 07/01/2021 9:38:03 AM 911 Outcome Documentation Doyle Askew, RN, Beth PLEASE NOTE: All timestamps contained within this report are represented as [place] Standard Time. CONFIDENTIALTY NOTICE: This fax transmission is intended only for the addressee. It contains information that is legally privileged, confidential or otherwise protected from use or disclosure. If you are not the intended recipient, you are strictly prohibited from reviewing, disclosing, copying using or disseminating any of this information or taking any action in reliance on or regarding this information. If you have received this fax in error, please notify us immediately by telephone so that we can arrange for its return to [place]. Phone: [phone], Toll-Free: [phone], Fax: [phone] Page: 2 of 2 Call Id: [id number] [place]. Time (Eastern Time) Disposition Final User Reason: Caller states he is getting dressed and going to call now 07/01/2021 9:32:17 AM Call EMS 911 Now Yes Doyle Askew, RN, [place] Caller Disagree/Comply Comply Caller Understands Yes PreDisposition InappropriateToAsk Care Advice Given Per Guideline CALL EMS 911 NOW: * Immediate medical attention is needed. You need to hang up and call 911 (or an ambulance). CARE ADVICE given per Leg Injury (Adult) guideline. Referrals [facility] - ED

## 2021-07-01 NOTE — ED Notes (Signed)
Reviewed discharge instructions with patient and daughter. Follow-up care and medications reviewed. Patient and daughter verbalized understanding. Patient A&Ox4, VSS, and ambulatory with steady gait upon discharge.  

## 2021-07-01 NOTE — Telephone Encounter (Signed)
Will await ER note.  Thanks.  

## 2021-07-01 NOTE — ED Triage Notes (Signed)
Pt has R knee pain and swelling after injuring it Tuesday while trying to get in a truck. Pt's foot slid sideways on the running board. No other injury.

## 2021-07-01 NOTE — Telephone Encounter (Signed)
Per chart review tab pt is at [facility] ED now. Sending note to Dr Damita Dunnings.

## 2021-07-02 NOTE — Telephone Encounter (Signed)
Patient has seen Dr. Lorelei Pont in the past (2017); patient was okay to schedule with Dr. Lorelei Pont for this. Schedule patient after speaking with Butch Penny about on 07/05/21 at 3:20 pm.

## 2021-07-02 NOTE — Telephone Encounter (Signed)
[name] called in and stated that he went to the hospital and they examined his knee and x-ray and told him that they can not drain it due to his is on lasix and referred him back to the sports medicine doctor. Wanted to know about what to do next.

## 2021-07-02 NOTE — Telephone Encounter (Signed)
Rec: sports meds vs ortho.  Thanks.

## 2021-07-05 ENCOUNTER — Encounter: Payer: Self-pay | Admitting: Family Medicine

## 2021-07-05 ENCOUNTER — Other Ambulatory Visit: Payer: Self-pay

## 2021-07-05 ENCOUNTER — Ambulatory Visit (INDEPENDENT_AMBULATORY_CARE_PROVIDER_SITE_OTHER): Payer: Medicare Other | Admitting: Family Medicine

## 2021-07-05 VITALS — BP 110/62 | HR 69 | Temp 98.0°F | Ht 67.0 in | Wt 169.0 lb

## 2021-07-05 DIAGNOSIS — I6523 Occlusion and stenosis of bilateral carotid arteries: Secondary | ICD-10-CM | POA: Diagnosis not present

## 2021-07-05 DIAGNOSIS — M1711 Unilateral primary osteoarthritis, right knee: Secondary | ICD-10-CM | POA: Diagnosis not present

## 2021-07-05 DIAGNOSIS — M25561 Pain in right knee: Secondary | ICD-10-CM | POA: Diagnosis not present

## 2021-07-05 DIAGNOSIS — M25461 Effusion, right knee: Secondary | ICD-10-CM

## 2021-07-05 MED ORDER — TRIAMCINOLONE ACETONIDE 40 MG/ML IJ SUSP
40.0000 mg | Freq: Once | INTRAMUSCULAR | Status: AC
Start: 2021-07-05 — End: 2021-07-05
  Administered 2021-07-05: 40 mg via INTRA_ARTICULAR

## 2021-07-05 NOTE — Progress Notes (Signed)
[name] T. Jeremiah Toto, MD, [place] at [facility] [place], [address]  Phone: [phone]  FAX: [phone]  [name] - 87 y.o. male  MRN [id number]  Date of Birth: 02-10-1932  Date: 07/05/2021  PCP: Tonia Ghent, MD  Referral: Tonia Ghent, MD  Chief Complaint  Patient presents with   Joint Swelling    Right Knee-Seen in ER 07/01/21    This visit occurred during the SARS-CoV-2 public health emergency.  Safety protocols were in place, including screening questions prior to the visit, additional usage of staff PPE, and extensive cleaning of exam room while observing appropriate contact time as indicated for disinfecting solutions.   Subjective:   [name] is a 87 y.o. very pleasant male patient with Body mass index is 26.47 kg/m. who presents with the following:   Lab Results  Component Value Date   HGBA1C 9.3 (H) 10/29/2020    Getting into a truck, and then when stepped up on a running board.  Then the R knee collapsed.  He did not strike the knee at all, and he did have a twisting sensation.  Felt it coming on, and then he was holding himself up.   Twisted his knee.   Medial pain.  He also has a large effusion.  He does take Eliquis.  There is no associated wound or bruising visualized at the knee. Does have a known history of osteoarthritis of the right knee, and I have seen him for this in the past as well. He does have pain with standing and bearing weight, and he has an office in a wheelchair, however he does stand when I ask him to get on the table with some difficulty.  No h/o gout  07/01/2021 ER visit, given tramadol after XR.  Asp 80 cc of serosang with blood Inj 40 mg kenalog only   Review of Systems is noted in the HPI, as appropriate   Objective:   BP 110/62   Pulse 69   Temp 98 F (36.7 C) (Temporal)   Ht 5\' 7"  (1.702 m)   Wt 169 lb (76.7 kg) Comment: Patient reported   SpO2 96%   BMI 26.47 kg/m   Pleasant elderly gentleman.  No acute distress. He does have some pain with standing and getting on the examination table.  He lacks 2 degrees of extension and he is able to flex his knee to 85 degrees.  He does have a very large effusion that is ballotable.  With stressing the knee with varus and valgus stress he does have pain, but the knee does not open.  His ACL and PCL do appear to be intact.  There is no associated wound or healing wound at the knee. There is no redness or significant warmth.  Radiology: DG Knee Complete 4 Views Right  Result Date: 07/01/2021 CLINICAL DATA:  Fall EXAM: RIGHT KNEE - COMPLETE 4+ VIEW COMPARISON:  Right knee radiographs 09/19/2016 FINDINGS: There is no acute fracture or dislocation. Alignment is normal. There is mild medial and lateral tibiofemoral joint space narrowing with associated subchondral sclerosis and osteophytosis, similar to the prior study. A calcified lesion is again seen in the suprapatellar space, present on multiple prior studies. There is a moderate to large effusion. Vascular calcifications and surgical clips are noted. IMPRESSION: 1. No acute fracture or dislocation. 2. Moderate to large joint effusion Electronically Signed   By: Court Joy.D.  On: 07/01/2021 11:34   DG Hip Unilat W or Wo Pelvis 2-3 Views Right  Result Date: 07/01/2021 CLINICAL DATA:  Fall EXAM: DG HIP (WITH OR WITHOUT PELVIS) 2-3V RIGHT COMPARISON:  None. FINDINGS: There is no acute fracture or dislocation. Femoroacetabular alignment is maintained. There is mild degenerative change about the hips bilaterally. The SI joints and symphysis pubis are intact. Vascular calcifications noted. IMPRESSION: No acute fracture or dislocation. Electronically Signed   By: Valetta Mole M.D.   On: 07/01/2021 11:37    Assessment and Plan:     ICD-10-CM   1. Effusion of right knee  M25.461 triamcinolone acetonide (KENALOG-40) injection 40 mg    2.  Acute pain of right knee  M25.561 triamcinolone acetonide (KENALOG-40) injection 40 mg    3. Primary osteoarthritis of right knee  M17.11      Acute knee injury, twisting, and the patient with known significant osteoarthritis.  Acute on chronic with exacerbation and distinct acute injury.  He has a very large effusion, and I think that this is limiting his range of motion quite a bit.  He also almost certainly has some internal derangement in the knee, and he is also on Eliquis, which likely contributes to the effusion.  On his knee aspirate, the patient does have a lot of blood in the synovial fluid as well.  I did not use lidocaine with the injection due to blood in the aspirate.  Aspiration/Injection Procedure Note [name] 02-01-1932 Date of procedure: 07/05/2021  Procedure: Large Joint Aspiration / Injection with synovial fluid aspiration of knee, R Indications: Pain  Procedure Details Patient verbally consented; risks, benefits, and alternatives explained. Patient prepped with Chloraprep. Ethyl chloride for anesthesia. 3 cc of 1% Lidocaine used in wheal then injected Subcutaneous fashion with 22 gauge needle on lateral approach. Under sterilne conditions, 18 gauge needle used via lateral approach to aspirate 80 cc of serosanguineous fluid tinged with quite a bit of bloody fluid. Then 1 mL of Kenalog 40 mg injected without additional anesthetic. Tolerated well, no complications. Medication: 1 mL of Kenalog 40 mg   Meds ordered this encounter  Medications   triamcinolone acetonide (KENALOG-40) injection 40 mg   Medications Discontinued During This Encounter  Medication Reason   cephALEXin (KEFLEX) 500 MG capsule Completed Course   No orders of the defined types were placed in this encounter.   Follow-up: No follow-ups on file.  [employer] speech-to-text software was used for transcription in this dictation.  Possible transcriptional errors can occur using Radio producer.   Signed,  Jeremiah Deed. Mattis Featherly, MD   Outpatient Encounter Medications as of 07/05/2021  Medication Sig   allopurinol (ZYLOPRIM) 100 MG tablet Take 0.5 tablets (50 mg total) by mouth every Monday, Wednesday, and Friday.   apixaban (ELIQUIS) 2.5 MG TABS tablet TAKE 1 TABLET TWICE A DAY   aspirin 81 MG tablet Take 81 mg by mouth daily.   BD INSULIN SYRINGE ULTRAFINE 31G X 5/16" 0.3 ML MISC USE DAILY AS INSTRUCTED   cetirizine (ZYRTEC) 10 MG tablet Take 10 mg by mouth daily as needed for allergies.   Cholecalciferol (VITAMIN D) 1000 UNITS capsule Take 1,000 Units by mouth daily.    diclofenac Sodium (VOLTAREN) 1 % GEL Apply 2 g topically 4 (four) times daily.   empagliflozin (JARDIANCE) 10 MG TABS tablet Take 1 tablet (10 mg total) by mouth daily before breakfast.   fish oil-omega-3 fatty acids 1000 MG capsule Take 1 g by mouth  daily.   glucose blood (FREESTYLE LITE) test strip USE TO TEST BLOOD SUGAR ONCE DAILY AND AS NEEDED FOR DIABETES MELLITUS   hydrALAZINE (APRESOLINE) 25 MG tablet Take 1 tablet (25 mg total) by mouth in the morning and at bedtime.   ipratropium (ATROVENT) 0.03 % nasal spray Place 2 sprays into both nostrils 2 (two) times daily as needed for rhinitis.   LANTUS 100 UNIT/ML injection INJECT 20 TO 25 UNITS AT BEDTIME   magnesium oxide (MAG-OX) 400 MG tablet Take 400 mg by mouth daily.   metoprolol tartrate (LOPRESSOR) 50 MG tablet TAKE 1 TABLET TWICE A DAY   Multiple Vitamin (MULTIVITAMIN WITH MINERALS) TABS Take 1 tablet by mouth daily.   nitroGLYCERIN (NITROSTAT) 0.4 MG SL tablet Place 1 tablet (0.4 mg total) under the tongue every 5 (five) minutes as needed for chest pain.   Potassium Gluconate 595 MG CAPS Take 595 mg by mouth daily.   ranolazine (RANEXA) 500 MG 12 hr tablet TAKE 1 TABLET TWICE A DAY   rosuvastatin (CRESTOR) 40 MG tablet TAKE 1 TABLET AT BEDTIME   traMADol (ULTRAM) 50 MG tablet Take 1 tablet (50 mg total) by mouth every 6 (six) hours as needed.    furosemide (LASIX) 80 MG tablet Take 1 tablet (80 mg total) by mouth 2 (two) times daily.   [DISCONTINUED] cephALEXin (KEFLEX) 500 MG capsule    [EXPIRED] triamcinolone acetonide (KENALOG-40) injection 40 mg    No facility-administered encounter medications on file as of 07/05/2021.

## 2021-07-07 ENCOUNTER — Telehealth: Payer: Self-pay | Admitting: Cardiovascular Disease

## 2021-07-07 ENCOUNTER — Ambulatory Visit: Payer: Medicare Other | Admitting: Family Medicine

## 2021-07-07 NOTE — Telephone Encounter (Signed)
New Message:    Myriam Jacobson from [facility] wants to know how long will patient have to be pre medicated before dental work? She wants to know if this is a life time thing or just for a period of time.?

## 2021-07-07 NOTE — Telephone Encounter (Signed)
S/P mitral valve clip implantation. Patient will need prophylactic antibiotic therapy prior to all dental visits indefinitely.  Notified [name]. Verbalized understanding.

## 2021-07-12 ENCOUNTER — Other Ambulatory Visit (HOSPITAL_COMMUNITY): Payer: Self-pay | Admitting: Internal Medicine

## 2021-07-28 ENCOUNTER — Other Ambulatory Visit: Payer: Self-pay | Admitting: Family Medicine

## 2021-07-28 DIAGNOSIS — Z08 Encounter for follow-up examination after completed treatment for malignant neoplasm: Secondary | ICD-10-CM | POA: Diagnosis not present

## 2021-07-28 DIAGNOSIS — X32XXXD Exposure to sunlight, subsequent encounter: Secondary | ICD-10-CM | POA: Diagnosis not present

## 2021-07-28 DIAGNOSIS — Z85828 Personal history of other malignant neoplasm of skin: Secondary | ICD-10-CM | POA: Diagnosis not present

## 2021-07-28 DIAGNOSIS — L570 Actinic keratosis: Secondary | ICD-10-CM | POA: Diagnosis not present

## 2021-09-06 ENCOUNTER — Telehealth: Payer: Self-pay | Admitting: Cardiovascular Disease

## 2021-09-06 NOTE — Telephone Encounter (Signed)
*  STAT* If patient is at the pharmacy, call can be transferred to refill team.   1. Which medications need to be refilled? (please list name of each medication and dose if known)  ranolazine (RANEXA) 500 MG 12 hr tablet  2. Which pharmacy/location (including street and city if local pharmacy) is medication to be sent to? [facility] [place], [place]  3. Do they need a 30 day or 90 day supply? 90 with refills

## 2021-09-08 MED ORDER — RANOLAZINE ER 500 MG PO TB12: 500.0000 mg | ORAL_TABLET | Freq: Two times a day (BID) | ORAL | 0 refills | Status: DC

## 2021-09-08 NOTE — Telephone Encounter (Signed)
Refills has been sent to the pharmacy. 

## 2021-09-20 ENCOUNTER — Telehealth: Payer: Self-pay | Admitting: Cardiovascular Disease

## 2021-09-20 MED ORDER — METOPROLOL TARTRATE 50 MG PO TABS: 50.0000 mg | ORAL_TABLET | Freq: Two times a day (BID) | ORAL | 1 refills | Status: DC

## 2021-09-20 NOTE — Telephone Encounter (Signed)
Rx sent to pharmacy patient aware ?

## 2021-09-20 NOTE — Telephone Encounter (Signed)
*  STAT* If patient is at the pharmacy, call can be transferred to refill team.   1. Which medications need to be refilled? (please list name of each medication and dose if known) metoprolol tartrate (LOPRESSOR) 50 MG tablet  2. Which pharmacy/location (including street and city if local pharmacy) is medication to be sent to? [facility] [place], [place]  3. Do they need a 30 day or 90 day supply? 90 day   Patient was saying he doesn't want to have to cut the pills in half. Please send the dosage he needs to take without having to cut the pills.

## 2021-09-22 ENCOUNTER — Other Ambulatory Visit: Payer: Self-pay | Admitting: Cardiovascular Disease

## 2021-10-26 NOTE — Progress Notes (Signed)
Subjective:   [name] is a 87 y.o. male who presents for Medicare Annual/Subsequent preventive examination.  I connected with Shondale Quinley today by telephone and verified that I am speaking with the correct person using two identifiers. Location patient: home Location provider: work Persons participating in the virtual visit: patient, Marine scientist.    I discussed the limitations, risks, security and privacy concerns of performing an evaluation and management service by telephone and the availability of in person appointments. I also discussed with the patient that there may be a patient responsible charge related to this service. The patient expressed understanding and verbally consented to this telephonic visit.    Interactive audio and video telecommunications were attempted between this provider and patient, however failed, due to patient having technical difficulties OR patient did not have access to video capability.  We continued and completed visit with audio only.  Some vital signs may be absent or patient reported.   Time Spent with patient on telephone encounter: 30 minutes  Review of Systems     Cardiac Risk Factors include: advanced age (>29men, >25 women);diabetes mellitus;hypertension;dyslipidemia     Objective:    Today's Vitals   10/28/21 0945  Weight: 165 lb (74.8 kg)  Height: 5\' 7"  (1.702 m)   Body mass index is 25.84 kg/m.  Advanced Directives 10/28/2021 07/20/2020 02/06/2020 02/03/2020 01/20/2020 01/06/2020 06/21/2019  Does Patient Have a Medical Advance Directive? Yes Yes Yes Yes - Yes Yes  Type of Advance Directive [place];Living will [place];Living will [place];Living will [place];Living will [place];Living will [place];Living will [place];Living will  Does patient want to make changes to medical advance directive? Yes  (MAU/Ambulatory/Procedural Areas - Information given) - No - Patient declined - - - No - Patient declined  Copy of Healthcare Power of Attorney in Chart? - No - copy requested No - copy requested No - copy requested - No - copy requested No - copy requested  Pre-existing out of facility DNR order (yellow form or pink MOST form) - - - - - - -    Current Medications (verified) Outpatient Encounter Medications as of 10/28/2021  Medication Sig   allopurinol (ZYLOPRIM) 100 MG tablet TAKE ONE-HALF (1/2) TABLET EVERY MONDAY, WEDNESDAY AND FRIDAY   apixaban (ELIQUIS) 2.5 MG TABS tablet TAKE 1 TABLET TWICE A DAY   aspirin 81 MG tablet Take 81 mg by mouth daily.   BD INSULIN SYRINGE ULTRAFINE 31G X 5/16" 0.3 ML MISC USE DAILY AS INSTRUCTED   cetirizine (ZYRTEC) 10 MG tablet Take 10 mg by mouth daily as needed for allergies.   Cholecalciferol (VITAMIN D) 1000 UNITS capsule Take 1,000 Units by mouth daily.    diclofenac Sodium (VOLTAREN) 1 % GEL Apply 2 g topically 4 (four) times daily.   fish oil-omega-3 fatty acids 1000 MG capsule Take 1 g by mouth daily.   glucose blood (FREESTYLE LITE) test strip USE TO TEST BLOOD SUGAR ONCE DAILY AND AS NEEDED FOR DIABETES MELLITUS   hydrALAZINE (APRESOLINE) 25 MG tablet Take 1 tablet (25 mg total) by mouth in the morning and at bedtime.   ipratropium (ATROVENT) 0.03 % nasal spray Place 2 sprays into both nostrils 2 (two) times daily as needed for rhinitis.   JARDIANCE 10 MG TABS tablet TAKE 1 TABLET DAILY BEFORE BREAKFAST   LANTUS 100 UNIT/ML injection INJECT 20 TO 25 UNITS AT BEDTIME  magnesium oxide (MAG-OX) 400 MG tablet Take 400 mg by mouth daily.   metoprolol tartrate (LOPRESSOR) 50 MG tablet Take 1 tablet (50 mg total) by mouth 2 (two) times daily.   Multiple Vitamin (MULTIVITAMIN WITH MINERALS) TABS Take 1 tablet by mouth daily.   nitroGLYCERIN (NITROSTAT) 0.4 MG SL tablet Place 1 tablet (0.4 mg total) under the tongue every 5 (five) minutes as needed for  chest pain.   Potassium Gluconate 595 MG CAPS Take 595 mg by mouth daily.   ranolazine (RANEXA) 500 MG 12 hr tablet Take 1 tablet (500 mg total) by mouth 2 (two) times daily.   rosuvastatin (CRESTOR) 40 MG tablet TAKE 1 TABLET AT BEDTIME   furosemide (LASIX) 80 MG tablet Take 1 tablet (80 mg total) by mouth 2 (two) times daily.   traMADol (ULTRAM) 50 MG tablet Take 1 tablet (50 mg total) by mouth every 6 (six) hours as needed. (Patient not taking: Reported on 10/28/2021)   No facility-administered encounter medications on file as of 10/28/2021.    Allergies (verified) Patient has no known allergies.   History: Past Medical History:  Diagnosis Date   Age-related macular degeneration, wet, both eyes ([place])    Anemia    Secondary to acute blood loss   Anginal pain ([name])    last chest pain in Feb 2021   Arthritis    "mild in hands, knees, ankles" (11/13/2015)   Atrial fibrillation ([place])    Consideration was given for atrial flutter ablation, but patient developed atrial fibrillation. Cardioversion was done. Dr. Caryl Comes decided to watch him clinically. November, 2011   Atrial flutter [facility]) 07/2010   September, 2011   Hospital with PNA and cath done.Marland KitchenMarland KitchenCoumadin.  Atrial flutter ablation planned, but  pt. then had atrial fibrillation,/outpatient conversion 09/08/10..NSR..plan to follow..Dr. Caryl Comes   CAD (coronary artery disease)    Catheterization, September, 2011,  grafts patent from redo CABG,, medical therapy of coronary disease, consideration to proceeding with atrial flutter ablation   Carotid artery disease ([place])    Doppler 09/18/2009 - 49% bilateral stenoses   Carotid artery disease ([name])    49% bilateral, Doppler, November, 2010   CHF (congestive heart failure) ([place])    Chronic kidney disease (CKD), stage III (moderate) ([place])    Diabetic peripheral neuropathy ([place])    feet   Diverticulosis of colon with hemorrhage 2009   several unit diverticular bleed    Erectile dysfunction     Mild   Gout    Hearing loss    wears hearing aids   History of blood transfusion "several"   related to diverticular bleeding   Hyperlipidemia    Hypertension    Mitral regurgitation    Mild, echo, September, 2011   NSTEMI (non-ST elevated myocardial infarction) ([place]) 07/2010   at [facility] with repeat cath, rec medical mgmt    OSA on CPAP    uses CPAP nightly   Personal history of colonic polyps    PNA (pneumonia) 9/11   NSTEMI at [facility] with repeat cath, rec medical mgmt    RBBB (right bundle branch block)    S/P mitral valve repair 02/06/2020   s/p TEER with a single MitraClip NTW placed on A2/P2   Shoulder pain    "positional; better now" (11/13/2015)   Skin cancer    R lower leg, per derm 2012   Type II diabetes mellitus ([place])    Past Surgical History:  Procedure Laterality Date   CARDIAC CATHETERIZATION  2006   Nuclear..slight  lateral ischemia..medical therapy   CARDIAC CATHETERIZATION  08/04/2010   grafts patent from redo CABG...medical Rx and ablate Atrial flutter (LV not injected)    CARDIOVERSION  ~ 2010   CAROTID ENDARTERECTOMY Right 1994   CATARACT EXTRACTION W/ INTRAOCULAR LENS  IMPLANT, BILATERAL Bilateral    COLON RESECTION N/A 01/13/2016   Procedure: EXPLORATORY LAPAROTOMY, LEFT AND SIGMOID COLON REMOVAL;  Surgeon: Ralene Ok, MD;  Location: [place];  Service: General;  Laterality: N/A;  Extended open left hemicolectomy and sigmoidectomy    COLONOSCOPY N/A 11/14/2015   Procedure: COLONOSCOPY;  Surgeon: Manus Gunning, MD;  Location: [place];  Service: Gastroenterology;  Laterality: N/A;   COLONOSCOPY Left 01/05/2016   Procedure: COLONOSCOPY;  Surgeon: Manus Gunning, MD;  Location: [place];  Service: Gastroenterology;  Laterality: Left;  no sedation to start, moderate if needed   COLONOSCOPY W/ POLYPECTOMY     CORONARY ARTERY BYPASS GRAFT  1995; 2006   "X 3; X3"   CORONARY ARTERY BYPASS GRAFT  1995, 2006   DOPPLER ECHOCARDIOGRAPHY  08/2008    EF 60%   DOPPLER ECHOCARDIOGRAPHY  08/02/2010   65-70%   DOPPLER ECHOCARDIOGRAPHY  07/2010   MR mild   ESOPHAGOGASTRODUODENOSCOPY N/A 06/19/2014   Procedure: ESOPHAGOGASTRODUODENOSCOPY (EGD);  Surgeon: Jerene Bears, MD;  Location: [facility] ENDOSCOPY;  Service: Endoscopy;  Laterality: N/A;   ESOPHAGOGASTRODUODENOSCOPY N/A 11/14/2015   Procedure: ESOPHAGOGASTRODUODENOSCOPY (EGD);  Surgeon: Manus Gunning, MD;  Location: [place];  Service: Gastroenterology;  Laterality: N/A;   FLEXIBLE SIGMOIDOSCOPY N/A 11/16/2015   Procedure: FLEXIBLE SIGMOIDOSCOPY;  Surgeon: Manus Gunning, MD;  Location: [facility] ENDOSCOPY;  Service: Gastroenterology;  Laterality: N/A;   LAPAROSCOPIC CHOLECYSTECTOMY  2008   LEFT HEART CATH N/A 06/14/2014   Procedure: LEFT HEART CATH;  Surgeon: Sinclair Grooms, MD;  Location: [facility];  Service: Cardiovascular;  Laterality: N/A;   LEFT HEART CATH AND CORONARY ANGIOGRAPHY N/A 08/02/2018   Procedure: LEFT HEART CATH AND CORONARY ANGIOGRAPHY;  Surgeon: [name], MD;  Location: [place] CV LAB;  Service: Cardiovascular;  Laterality: N/A;   LEFT HEART CATHETERIZATION WITH CORONARY/GRAFT ANGIOGRAM N/A 06/11/2014   Procedure: LEFT HEART CATHETERIZATION WITH Beatrix Fetters;  Surgeon: Sinclair Grooms, MD;  Location: [facility] CATH LAB;  Service: Cardiovascular;  Laterality: N/A;   MITRAL VALVE REPAIR N/A 02/06/2020   Procedure: MITRAL VALVE REPAIR;  Surgeon: Sherren Mocha, MD;  Location: [place] CV LAB;  Service: Cardiovascular;  Laterality: N/A;   PERCUTANEOUS CORONARY STENT INTERVENTION (PCI-S) N/A 06/13/2014   Procedure: PERCUTANEOUS CORONARY STENT INTERVENTION (PCI-S);  Surgeon: Sinclair Grooms, MD;  Location: [facility] CATH LAB;  Service: Cardiovascular;  Laterality: N/A;   RIGHT HEART CATH N/A 01/06/2020   Procedure: RIGHT HEART CATH;  Surgeon: Jolaine Artist, MD;  Location: [place] CV LAB;  Service: Cardiovascular;  Laterality: N/A;   SKIN CANCER  EXCISION Left 10/2015   calf   SKIN CANCER EXCISION Right 2014?   chest   TEE WITHOUT CARDIOVERSION N/A 01/06/2020   Procedure: TRANSESOPHAGEAL ECHOCARDIOGRAM (TEE);  Surgeon: Jolaine Artist, MD;  Location: [facility] ENDOSCOPY;  Service: Cardiovascular;  Laterality: N/A;   TONSILLECTOMY     VASECTOMY     Family History  Problem Relation Age of Onset   Kidney disease Mother        Kidney failure   Stroke Mother    Diabetes Mother    Heart disease Father        MI  Arthritis Sister    Cancer Sister        Throat   Heart disease Sister        MI   Diabetes Sister    Prostate cancer Neg Hx    Colon cancer Neg Hx    Social History   Socioeconomic History   Marital status: Widowed    Spouse name: Not on file   Number of children: 2   Years of education: Not on file   Highest education level: Not on file  Occupational History   Occupation: Retired Teacher, 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     Lives with girlfriend [name].  Widowed 12/2005.   Social Determinants of Health   Financial Resource Strain: Low Risk    Difficulty of Paying Living Expenses: Not hard at all  Food Insecurity: No Food Insecurity   Worried About Charity fundraiser in the Last Year: Never true   [place] in the Last Year: Never true  Transportation Needs: No Transportation Needs   Lack of Transportation (Medical): No   Lack of  Transportation (Non-Medical): No  Physical Activity: Insufficiently Active   Days of Exercise per Week: 3 days   Minutes of Exercise per Session: 10 min  Stress: No Stress Concern Present   Feeling of Stress : Not at all  Social Connections: Moderately Isolated   Frequency of Communication with Friends and Family: More than three times a week   Frequency of Social Gatherings with Friends and Family: More than three times a week   Attends Religious Services: Never   Marine scientist or Organizations: Yes   Attends Music therapist: More than 4 times per year   Marital Status: Widowed    Tobacco Counseling Counseling given: Not Answered Tobacco comments: "quit smoking cigarettes in 1958"   Clinical Intake:  Pre-visit preparation completed: Yes  Pain : No/denies pain     BMI - recorded: 25.84 Nutritional Status: BMI 25 -29 Overweight Nutritional Risks: None Diabetes: Yes CBG done?: No Did pt. bring in CBG monitor from home?: No  How often do you need to have someone help you when you read instructions, pamphlets, or other written materials from your doctor or pharmacy?: 1 - Never  Diabetes:  Is the patient diabetic?  Yes  If diabetic, was a CBG obtained today?  No , visit completed over the phone. Did the patient bring in their glucometer from home?  No , visit completed over the phone.  How often do you monitor your CBG's? daily.   Financial Strains and Diabetes Management:  Are you having any financial strains with the device, your supplies or your medication? No .  Does the patient want to be seen  by Chronic Care Management for management of their diabetes?  No  Would the patient like to be referred to a Nutritionist or for Diabetic Management?  No   Diabetic Exams:  Diabetic Eye Exam: Overdue for diabetic eye exam. Pt has been advised about the importance in completing this exam. A referral has been placed today.   Diabetic Foot Exam:  Pt has  been advised about the importance in completing this exam.    Interpreter Needed?: No  Information entered by :: Orrin Brigham LPN   Activities of Daily Living In your present state of health, do you have any difficulty performing the following activities: 10/28/2021  Hearing? Y  Comment wears hearing aids  Vision? N  Difficulty concentrating or making decisions? N  Comment Just remembering doctor names  Walking or climbing stairs? Y  Comment uses cane  Dressing or bathing? N  Doing errands, shopping? Y  Comment patient does not drive son or daughter Restaurant manager, fast food and eating ? N  Using the Toilet? N  In the past six months, have you accidently leaked urine? N  Do you have problems with loss of bowel control? N  Managing your Medications? N  Managing your Finances? N  Housekeeping or managing your Housekeeping? Y  Comment someone comes to clean  Some recent data might be hidden    Patient Care Team: Tonia Ghent, MD as PCP - General Gwenlyn Found Pearletha Forge, MD as PCP - Cardiology (Cardiology) Royann Shivers, MD as Referring Physician (Nephrology) [name], MD as Consulting Physician (Cardiology) Barbaraann Cao, OD as Referring Physician (Optometry) Allyn Kenner, MD as Consulting Physician (Dermatology)  Indicate any recent Medical Services you may have received from other than [facility] providers in the past year (date may be approximate).     Assessment:   This is a routine wellness examination for Quincey.  Hearing/Vision screen Hearing Screening - Comments:: Wears hearing aids Vision Screening - Comments:: Last exam 3 years, plans to make an appointment , Dr. Sabra Heck  Dietary issues and exercise activities discussed: Current Exercise Habits: Home exercise routine, Type of exercise: walking, Time (Minutes): 10, Frequency (Times/Week): 3, Weekly Exercise (Minutes/Week): 30, Intensity: Mild   Goals Addressed             This Visit's Progress     Patient Stated       Would like to cut back on sugar intake       Depression Screen PHQ 2/9 Scores 10/28/2021 10/29/2020 06/21/2019 05/09/2018 05/08/2017 03/07/2016 09/08/2014  PHQ - 2 Score 0 0 0 0 0 0 0  PHQ- 9 Score - - 2 0 - - -    Fall Risk Fall Risk  10/28/2021 10/29/2020 06/21/2019 05/09/2018 06/09/2017  Falls in the past year? 1 0 0 No No  Comment - - - - Emmi Telephone Survey: data to providers prior to load  Number falls in past yr: 0 0 - - -  Injury with Fall? 0 0 - - -  Risk for fall due to : Other (Comment) - Impaired balance/gait;Medication side effect - -  Risk for fall due to: Comment foot slipped - - - -  Follow up Falls prevention discussed Falls evaluation completed Falls evaluation completed;Falls prevention discussed - -    FALL RISK PREVENTION PERTAINING TO THE HOME:  Any stairs in or around the home? Yes  If so, are there any without handrails? No  Home free of loose throw rugs in walkways, pet  beds, electrical cords, etc? Yes  Adequate lighting in your home to reduce risk of falls? Yes   ASSISTIVE DEVICES UTILIZED TO PREVENT FALLS:  Life alert? No  Use of a cane, walker or w/c? Yes , cane Grab bars in the bathroom? Yes  Shower chair or bench in shower? No  Elevated toilet seat or a handicapped toilet? Yes   TIMED UP AND GO:  Was the test performed? No , visit completed over the phone.    Cognitive Function: MMSE - Mini Mental State Exam 06/21/2019 05/09/2018 05/08/2017 03/07/2016  Orientation to time 5 5 5 5   Orientation to Place 5 5 5 5   Registration 3 3 3 3   Attention/ Calculation 5 0 0 0  Recall 2 3 3 3   Recall-comments missed one - - -  Language- name 2 objects 0 0 0 0  Language- repeat 1 1 1 1   Language- follow 3 step command 0 3 3 3   Language- read & follow direction 0 0 0 0  Write a sentence 0 0 0 0  Copy design 0 0 0 0  Total score 21 20 20 20         Immunizations Immunization History  Administered Date(s) Administered   Influenza  Split 09/05/2011, 09/24/2012, 07/17/2014   Influenza Whole 09/14/2006, 09/12/2007   Influenza,inj,Quad PF,6+ Mos 09/19/2013, 08/31/2015, 07/07/2016   Influenza-Unspecified 09/29/2020, 08/04/2021   PFIZER(Purple Top)SARS-COV-2 Vaccination 12/20/2019, 01/10/2020, 09/22/2020   Pfizer Covid-19 Vaccine Bivalent Booster 59yrs & up 08/04/2021   Pneumococcal Conjugate-13 04/28/2015   Pneumococcal Polysaccharide-23 09/14/2002   Td 12/10/1998, 03/16/2009, 06/25/2019   Zoster, Live 10/04/2007    TDAP status: Up to date  Flu Vaccine status: Up to date  Pneumococcal vaccine status: Up to date  Covid-19 vaccine status: Completed vaccines  Qualifies for Shingles Vaccine? Yes   Zostavax completed Yes   Shingrix Completed?: No.    Education has been provided regarding the importance of this vaccine. Patient has been advised to call insurance company to determine out of pocket expense if they have not yet received this vaccine. Advised may also receive vaccine at local pharmacy or Health Dept. Verbalized acceptance and understanding.  Screening Tests Health Maintenance  Topic Date Due   Zoster Vaccines- Shingrix (1 of 2) Never done   OPHTHALMOLOGY EXAM  04/14/2019   HEMOGLOBIN A1C  04/29/2021   FOOT EXAM  10/29/2021   TETANUS/TDAP  06/24/2029   Pneumonia Vaccine 40+ Years old  Completed   INFLUENZA VACCINE  Completed   COVID-19 Vaccine  Completed   HPV VACCINES  Aged Out    Health Maintenance  Health Maintenance Due  Topic Date Due   Zoster Vaccines- Shingrix (1 of 2) Never done   OPHTHALMOLOGY EXAM  04/14/2019   HEMOGLOBIN A1C  04/29/2021    Colorectal cancer screening: No longer required.   Lung Cancer Screening: (Low Dose CT Chest recommended if Age 31-80 years, 30 pack-year currently smoking OR have quit w/in 15years.) does not qualify.     Additional Screening:  Hepatitis C Screening: does not qualify  Vision Screening: Recommended annual ophthalmology exams for early  detection of glaucoma and other disorders of the eye. Is the patient up to date with their annual eye exam?  No  Who is the provider or what is the name of the office in which the patient attends annual eye exams? Dr. Sabra Heck     Dental Screening: Recommended annual dental exams for proper oral hygiene  Community Resource Referral / Chronic Care  Management: CRR required this visit?  No   CCM required this visit?  No      Plan:     I have personally reviewed and noted the following in the patient's chart:   Medical and social history Use of alcohol, tobacco or illicit drugs  Current medications and supplements including opioid prescriptions. Patient is not currently taking opioid prescriptions. Functional ability and status Nutritional status Physical activity Advanced directives List of other physicians Hospitalizations, surgeries, and ER visits in previous 12 months Vitals Screenings to include cognitive, depression, and falls Referrals and appointments  In addition, I have reviewed and discussed with patient certain preventive protocols, quality metrics, and best practice recommendations. A written personalized care plan for preventive services as well as general preventive health recommendations were provided to patient.   Due to this being a telephonic visit, the after visit summary with patients personalized plan was offered to patient via mail or my-chart. Patient preferred to pick up at office at next visit.   Loma Messing, LPN   16/08/[id number]   Nurse Health Advisor  Nurse Notes: none

## 2021-10-28 ENCOUNTER — Ambulatory Visit (INDEPENDENT_AMBULATORY_CARE_PROVIDER_SITE_OTHER): Payer: Medicare Other

## 2021-10-28 VITALS — Ht 67.0 in | Wt 165.0 lb

## 2021-10-28 DIAGNOSIS — Z0000 Encounter for general adult medical examination without abnormal findings: Secondary | ICD-10-CM | POA: Diagnosis not present

## 2021-10-28 NOTE — Patient Instructions (Signed)
[name] , Thank you for taking time to complete your Medicare Wellness Visit. I appreciate your ongoing commitment to your health goals. Please review the following plan we discussed and let me know if I can assist you in the future.   Screening recommendations/referrals: Colonoscopy: no longer required  Recommended yearly ophthalmology/optometry visit for glaucoma screening and checkup Recommended yearly dental visit for hygiene and checkup  Vaccinations: Influenza vaccine: up to date Pneumococcal vaccine: up to date  Tdap vaccine: up to date, completed 06/25/19, due 06/24/29 Shingles vaccine: Discuss with your local pharmacy   Covid-19:  up to date  Advanced directives: copy on file  Conditions/risks identified: see problem list  Next appointment: Follow up in one year for your annual wellness visit. 11/01/22 @ 10:30, this will be a telephone visit  Preventive Care 85 Years and Older, Male Preventive care refers to lifestyle choices and visits with your health care provider that can promote health and wellness. What does preventive care include? A yearly physical exam. This is also called an annual well check. Dental exams once or twice a year. Routine eye exams. Ask your health care provider how often you should have your eyes checked. Personal lifestyle choices, including: Daily care of your teeth and gums. Regular physical activity. Eating a healthy diet. Avoiding tobacco and drug use. Limiting alcohol use. Practicing safe sex. Taking low doses of aspirin every day. Taking vitamin and mineral supplements as recommended by your health care provider. What happens during an annual well check? The services and screenings done by your health care provider during your annual well check will depend on your age, overall health, lifestyle risk factors, and family history of disease. Counseling  Your health care provider may ask you questions about your: Alcohol use. Tobacco  use. Drug use. Emotional well-being. Home and relationship well-being. Sexual activity. Eating habits. History of falls. Memory and ability to understand (cognition). Work and work Statistician. Screening  You may have the following tests or measurements: Height, weight, and BMI. Blood pressure. Lipid and cholesterol levels. These may be checked every 5 years, or more frequently if you are over 28 years old. Skin check. Lung cancer screening. You may have this screening every year starting at age 85 if you have a 30-pack-year history of smoking and currently smoke or have quit within the past 15 years. Fecal occult blood test (FOBT) of the stool. You may have this test every year starting at age 85. Flexible sigmoidoscopy or colonoscopy. You may have a sigmoidoscopy every 5 years or a colonoscopy every 10 years starting at age 85. Prostate cancer screening. Recommendations will vary depending on your family history and other risks. Hepatitis C blood test. Hepatitis B blood test. Sexually transmitted disease (STD) testing. Diabetes screening. This is done by checking your blood sugar (glucose) after you have not eaten for a while (fasting). You may have this done every 1-3 years. Abdominal aortic aneurysm (AAA) screening. You may need this if you are a current or former smoker. Osteoporosis. You may be screened starting at age 85 if you are at high risk. Talk with your health care provider about your test results, treatment options, and if necessary, the need for more tests. Vaccines  Your health care provider may recommend certain vaccines, such as: Influenza vaccine. This is recommended every year. Tetanus, diphtheria, and acellular pertussis (Tdap, Td) vaccine. You may need a Td booster every 10 years. Zoster vaccine. You may need this after age 85. Pneumococcal 13-valent conjugate (  PCV13) vaccine. One dose is recommended after age 26. Pneumococcal polysaccharide (PPSV23) vaccine.  One dose is recommended after age 85. Talk to your health care provider about which screenings and vaccines you need and how often you need them. This information is not intended to replace advice given to you by your health care provider. Make sure you discuss any questions you have with your health care provider. Document Released: 11/27/2015 Document Revised: 07/20/2016 Document Reviewed: 09/01/2015 Elsevier Interactive Patient Education  2017 [place] Prevention in the Home Falls can cause injuries. They can happen to people of all ages. There are many things you can do to make your home safe and to help prevent falls. What can I do on the outside of my home? Regularly fix the edges of walkways and driveways and fix any cracks. Remove anything that might make you trip as you walk through a door, such as a raised step or threshold. Trim any bushes or trees on the path to your home. Use bright outdoor lighting. Clear any walking paths of anything that might make someone trip, such as rocks or tools. Regularly check to see if handrails are loose or broken. Make sure that both sides of any steps have handrails. Any raised decks and porches should have guardrails on the edges. Have any leaves, snow, or ice cleared regularly. Use sand or salt on walking paths during winter. Clean up any spills in your garage right away. This includes oil or grease spills. What can I do in the bathroom? Use night lights. Install grab bars by the toilet and in the tub and shower. Do not use towel bars as grab bars. Use non-skid mats or decals in the tub or shower. If you need to sit down in the shower, use a plastic, non-slip stool. Keep the floor dry. Clean up any water that spills on the floor as soon as it happens. Remove soap buildup in the tub or shower regularly. Attach bath mats securely with double-sided non-slip rug tape. Do not have throw rugs and other things on the floor that can make  you trip. What can I do in the bedroom? Use night lights. Make sure that you have a light by your bed that is easy to reach. Do not use any sheets or blankets that are too big for your bed. They should not hang down onto the floor. Have a firm chair that has side arms. You can use this for support while you get dressed. Do not have throw rugs and other things on the floor that can make you trip. What can I do in the kitchen? Clean up any spills right away. Avoid walking on wet floors. Keep items that you use a lot in easy-to-reach places. If you need to reach something above you, use a strong step stool that has a grab bar. Keep electrical cords out of the way. Do not use floor polish or wax that makes floors slippery. If you must use wax, use non-skid floor wax. Do not have throw rugs and other things on the floor that can make you trip. What can I do with my stairs? Do not leave any items on the stairs. Make sure that there are handrails on both sides of the stairs and use them. Fix handrails that are broken or loose. Make sure that handrails are as long as the stairways. Check any carpeting to make sure that it is firmly attached to the stairs. Fix any carpet that is  loose or worn. Avoid having throw rugs at the top or bottom of the stairs. If you do have throw rugs, attach them to the floor with carpet tape. Make sure that you have a light switch at the top of the stairs and the bottom of the stairs. If you do not have them, ask someone to add them for you. What else can I do to help prevent falls? Wear shoes that: Do not have high heels. Have rubber bottoms. Are comfortable and fit you well. Are closed at the toe. Do not wear sandals. If you use a stepladder: Make sure that it is fully opened. Do not climb a closed stepladder. Make sure that both sides of the stepladder are locked into place. Ask someone to hold it for you, if possible. Clearly mark and make sure that you can  see: Any grab bars or handrails. First and last steps. Where the edge of each step is. Use tools that help you move around (mobility aids) if they are needed. These include: Canes. Walkers. Scooters. Crutches. Turn on the lights when you go into a dark area. Replace any light bulbs as soon as they burn out. Set up your furniture so you have a clear path. Avoid moving your furniture around. If any of your floors are uneven, fix them. If there are any pets around you, be aware of where they are. Review your medicines with your doctor. Some medicines can make you feel dizzy. This can increase your chance of falling. Ask your doctor what other things that you can do to help prevent falls. This information is not intended to replace advice given to you by your health care provider. Make sure you discuss any questions you have with your health care provider. Document Released: 08/27/2009 Document Revised: 04/07/2016 Document Reviewed: 12/05/2014 Elsevier Interactive Patient Education  2017 [employer].

## 2021-11-04 ENCOUNTER — Other Ambulatory Visit (HOSPITAL_COMMUNITY): Payer: Self-pay | Admitting: Cardiology

## 2021-11-18 ENCOUNTER — Emergency Department (HOSPITAL_COMMUNITY): Payer: Medicare Other

## 2021-11-18 ENCOUNTER — Other Ambulatory Visit: Payer: Self-pay

## 2021-11-18 ENCOUNTER — Encounter (HOSPITAL_COMMUNITY): Payer: Self-pay | Admitting: Emergency Medicine

## 2021-11-18 ENCOUNTER — Emergency Department (HOSPITAL_COMMUNITY)
Admission: EM | Admit: 2021-11-18 | Discharge: 2021-11-19 | Disposition: A | Discharge status: Home / Self Care | Payer: Medicare Other | Attending: Student | Admitting: Student

## 2021-11-18 DIAGNOSIS — I6523 Occlusion and stenosis of bilateral carotid arteries: Secondary | ICD-10-CM | POA: Diagnosis not present

## 2021-11-18 DIAGNOSIS — Z7982 Long term (current) use of aspirin: Secondary | ICD-10-CM | POA: Insufficient documentation

## 2021-11-18 DIAGNOSIS — Z7901 Long term (current) use of anticoagulants: Secondary | ICD-10-CM | POA: Insufficient documentation

## 2021-11-18 DIAGNOSIS — S81811A Laceration without foreign body, right lower leg, initial encounter: Secondary | ICD-10-CM | POA: Diagnosis not present

## 2021-11-18 DIAGNOSIS — W19XXXA Unspecified fall, initial encounter: Secondary | ICD-10-CM

## 2021-11-18 DIAGNOSIS — S199XXA Unspecified injury of neck, initial encounter: Secondary | ICD-10-CM | POA: Insufficient documentation

## 2021-11-18 DIAGNOSIS — R58 Hemorrhage, not elsewhere classified: Secondary | ICD-10-CM | POA: Diagnosis not present

## 2021-11-18 DIAGNOSIS — J439 Emphysema, unspecified: Secondary | ICD-10-CM | POA: Diagnosis not present

## 2021-11-18 DIAGNOSIS — S8991XA Unspecified injury of right lower leg, initial encounter: Secondary | ICD-10-CM | POA: Diagnosis present

## 2021-11-18 DIAGNOSIS — W01198A Fall on same level from slipping, tripping and stumbling with subsequent striking against other object, initial encounter: Secondary | ICD-10-CM | POA: Insufficient documentation

## 2021-11-18 DIAGNOSIS — I700 Atherosclerosis of aorta: Secondary | ICD-10-CM | POA: Diagnosis not present

## 2021-11-18 DIAGNOSIS — S0990XA Unspecified injury of head, initial encounter: Secondary | ICD-10-CM | POA: Diagnosis not present

## 2021-11-18 DIAGNOSIS — Z743 Need for continuous supervision: Secondary | ICD-10-CM | POA: Diagnosis not present

## 2021-11-18 NOTE — ED Provider Triage Note (Signed)
Emergency Medicine Provider Triage Evaluation Note  [name] , a 88 y.o. male  was evaluated in triage.  Pt complains of laceration to right lower leg after suffering a mechanical fall.  Patient states that approximately 1 hour prior he tripped while entering his house and fell hitting his right lower leg against the door frame.  Patient denies hitting his head or any loss of consciousness.  Patient is on Eliquis.  Patient believes his last tetanus shot was within 5 years.  Patient endorses stinging sensation to laceration.  Denies any other pain discomfort at this time.  Review of Systems  Positive: Wound Negative: Neck pain, back pain, headache, numbness, weakness  Physical Exam  BP (!) 153/67 (BP Location: Right Arm)    Pulse 63    Temp 98.2 F (36.8 C) (Oral)    Resp 15    SpO2 100%  Gen:   Awake, no distress   Resp:  Normal effort  MSK:   Moves extremities without difficulty; skin tear and laceration to anterior right lower leg Other:  No midline tenderness or deformity to cervical, thoracic, or lumbar spine.  Head is atraumatic.  Medical Decision Making  Medically screening exam initiated at 10:26 PM.  Appropriate orders placed.  SAUD BAIL was informed that the remainder of the evaluation will be completed by another provider, this initial triage assessment does not replace that evaluation, and the importance of remaining in the ED until their evaluation is complete.  Due to fall with patient on blood thinners will obtain noncontrast head and cervical spine CT.   Loni Beckwith, [place] 11/18/21 2228

## 2021-11-18 NOTE — ED Triage Notes (Signed)
Patient from home, fell and hit right lower leg on edge of a door.  Patient with laceration to right shin, approx 2 inches long.  Bleeding controlled at this time.  Skin tear to area.  Patient is on Eliquis.  No LOC ,did not hit his head.  VSS.

## 2021-11-19 ENCOUNTER — Other Ambulatory Visit: Payer: Self-pay | Admitting: Cardiovascular Disease

## 2021-11-19 DIAGNOSIS — S81811A Laceration without foreign body, right lower leg, initial encounter: Secondary | ICD-10-CM | POA: Diagnosis not present

## 2021-11-19 MED ORDER — LIDOCAINE HCL (PF) 1 % IJ SOLN
INTRAMUSCULAR | Status: AC
  Filled 2021-11-19: qty 5

## 2021-11-19 NOTE — ED Provider Notes (Signed)
[facility] EMERGENCY DEPARTMENT Provider Note   CSN: [id number] Arrival date & time: 11/18/21  2145     History  Chief Complaint  Patient presents with   [name] is a 88 y.o. male who presents emergency department for evaluation of a right lower extremity wound.  Patient states that approximately 1 hour prior to arrival he tripped while entering his house and struck his right shin against a door frame.  Patient is currently on Eliquis and did fall to the ground.  He had no direct head strike but did have a whiplash motion of the head.  He arrives with 2 gaping lacerations to the shin with constant venous oozing.  pulses intact.  Patient is full range of motion.   Fall      Home Medications Prior to Admission medications   Medication Sig Start Date End Date Taking? Authorizing Provider  allopurinol (ZYLOPRIM) 100 MG tablet TAKE ONE-HALF (1/2) TABLET EVERY MONDAY, [facility] AND FRIDAY 07/29/21   Tonia Ghent, MD  apixaban Arne Cleveland) 2.5 MG TABS tablet TAKE 1 TABLET TWICE A DAY 06/15/21   Eileen Stanford, PA-C  aspirin 81 MG tablet Take 81 mg by mouth daily.    [provider]  BD INSULIN SYRINGE ULTRAFINE 31G X 5/16" 0.3 ML MISC USE DAILY AS INSTRUCTED 12/02/15   Tonia Ghent, MD  cetirizine (ZYRTEC) 10 MG tablet Take 10 mg by mouth daily as needed for allergies.    [provider]  Cholecalciferol (VITAMIN D) 1000 UNITS capsule Take 1,000 Units by mouth daily.     [provider]  diclofenac Sodium (VOLTAREN) 1 % GEL Apply 2 g topically 4 (four) times daily. 07/20/20   Loura Halt A, NP  fish oil-omega-3 fatty acids 1000 MG capsule Take 1 g by mouth daily.    [provider]  furosemide (LASIX) 80 MG tablet TAKE 1 TABLET TWICE A DAY (DOSAGE CHANGE) 11/04/21   [name], MD  glucose blood (FREESTYLE LITE) test strip USE TO TEST BLOOD SUGAR ONCE DAILY AND AS NEEDED FOR DIABETES MELLITUS 01/08/21   Tonia Ghent, MD  hydrALAZINE (APRESOLINE) 25 MG tablet Take 1 tablet (25 mg total) by mouth in the morning and at bedtime. 02/25/21   [name], MD  ipratropium (ATROVENT) 0.03 % nasal spray Place 2 sprays into both nostrils 2 (two) times daily as needed for rhinitis. 10/29/20   Tonia Ghent, MD  JARDIANCE 10 MG TABS tablet TAKE 1 TABLET DAILY BEFORE BREAKFAST 07/13/21   Bensimhon, Shaune Pascal, MD  LANTUS 100 UNIT/ML injection INJECT 20 TO 25 UNITS AT BEDTIME 04/20/21   Tonia Ghent, MD  magnesium oxide (MAG-OX) 400 MG tablet Take 400 mg by mouth daily.    [provider]  metoprolol tartrate (LOPRESSOR) 50 MG tablet Take 1 tablet (50 mg total) by mouth 2 (two) times daily. 09/20/21   [name], MD  Multiple Vitamin (MULTIVITAMIN WITH MINERALS) TABS Take 1 tablet by mouth daily.    [provider]  nitroGLYCERIN (NITROSTAT) 0.4 MG SL tablet Place 1 tablet (0.4 mg total) under the tongue every 5 (five) minutes as needed for chest pain. 01/24/17   [name], MD  Potassium Gluconate 595 MG CAPS Take 595 mg by mouth daily.    [provider]  ranolazine (RANEXA) 500 MG 12 hr tablet Take 1 tablet (500 mg total) by mouth 2 (two) times daily.  09/08/21   [name], MD  rosuvastatin (CRESTOR) 40 MG tablet TAKE 1 TABLET AT BEDTIME 09/22/21   [name], MD  traMADol (ULTRAM) 50 MG tablet Take 1 tablet (50 mg total) by mouth every 6 (six) hours as needed. Patient not taking: Reported on 10/28/2021 07/01/21   Malvin Johns, MD      Allergies    Patient has no known allergies.    Review of Systems   Review of Systems  Skin:  Positive for wound.   Physical Exam Updated Vital Signs BP (!) 155/74    Pulse 71    Temp 98.2 F (36.8 C) (Oral)    Resp 19    SpO2 96%  Physical Exam Vitals and nursing note reviewed.  Constitutional:      General: He is not in acute distress.    Appearance: He is well-developed.  HENT:     Head: Normocephalic and  atraumatic.  Eyes:     Conjunctiva/sclera: Conjunctivae normal.  Cardiovascular:     Rate and Rhythm: Normal rate and regular rhythm.  Musculoskeletal:        General: No swelling.     Cervical back: Neck supple.  Skin:    General: Skin is warm and dry.     Capillary Refill: Capillary refill takes less than 2 seconds.     Findings: Lesion (2 lacerations, sick centimeter and 4 cm to the anterior shin.) present.  Neurological:     Mental Status: He is alert.  Psychiatric:        Mood and Affect: Mood normal.    ED Results / Procedures / Treatments   Labs (all labs ordered are listed, but only abnormal results are displayed) Labs Reviewed - No data to display  EKG None  Radiology CT HEAD WO CONTRAST (5MM)  Result Date: 11/18/2021 CLINICAL DATA:  Head trauma, minor (Age >= 65y); Neck trauma (Age >= 65y). Pt was at home walking into the front door when he fell EXAM: CT HEAD WITHOUT CONTRAST CT CERVICAL SPINE WITHOUT CONTRAST TECHNIQUE: Multidetector CT imaging of the head and cervical spine was performed following the standard protocol without intravenous contrast. Multiplanar CT image reconstructions of the cervical spine were also generated. COMPARISON:  CT head 10/31/2012 FINDINGS: CT HEAD FINDINGS BRAIN: BRAIN Cerebral ventricle sizes are concordant with the degree of cerebral volume loss. Patchy and confluent areas of decreased attenuation are noted throughout the deep and periventricular white matter of the cerebral hemispheres bilaterally, compatible with chronic microvascular ischemic disease. No evidence of large-territorial acute infarction. No parenchymal hemorrhage. No mass lesion. No extra-axial collection. No mass effect or midline shift. No hydrocephalus. Basilar cisterns are patent. Vascular: No hyperdense vessel. No abdominal aorta or iliac aneurysm. Mild atherosclerotic plaque of the aorta and its branches. No abdominal, pelvic, or inguinal lymphadenopathy. Skull: No acute  fracture or focal lesion. Sinuses/Orbits: Bilateral ethmoid and sphenoid mucosal thickening. Paranasal sinuses and mastoid air cells are clear. Bilateral lens replacement. Otherwise the orbits are unremarkable. Other: None. CT CERVICAL SPINE FINDINGS Alignment: Reversal of the normal cervical lordosis centered at the C3-C4 level likely due to degenerative changes and positioning. Skull base and vertebrae: Multilevel degenerative changes of the spine no acute fracture. No aggressive appearing focal osseous lesion or focal pathologic process. Soft tissues and spinal canal: No prevertebral fluid or swelling. No visible canal hematoma. Upper chest: Biapical paraseptal emphysematous changes. Other: Atherosclerotic plaque of the carotid arteries within the neck. IMPRESSION: 1. No acute intracranial abnormality. 2.  No acute displaced fracture or traumatic listhesis of the cervical spine. 3.  Emphysema (ICD10-J43.9). Electronically Signed   By: Iven Finn M.D.   On: 11/18/2021 22:58   CT Cervical Spine Wo Contrast  Result Date: 11/18/2021 CLINICAL DATA:  Head trauma, minor (Age >= 65y); Neck trauma (Age >= 65y). Pt was at home walking into the front door when he fell EXAM: CT HEAD WITHOUT CONTRAST CT CERVICAL SPINE WITHOUT CONTRAST TECHNIQUE: Multidetector CT imaging of the head and cervical spine was performed following the standard protocol without intravenous contrast. Multiplanar CT image reconstructions of the cervical spine were also generated. COMPARISON:  CT head 10/31/2012 FINDINGS: CT HEAD FINDINGS BRAIN: BRAIN Cerebral ventricle sizes are concordant with the degree of cerebral volume loss. Patchy and confluent areas of decreased attenuation are noted throughout the deep and periventricular white matter of the cerebral hemispheres bilaterally, compatible with chronic microvascular ischemic disease. No evidence of large-territorial acute infarction. No parenchymal hemorrhage. No mass lesion. No extra-axial  collection. No mass effect or midline shift. No hydrocephalus. Basilar cisterns are patent. Vascular: No hyperdense vessel. No abdominal aorta or iliac aneurysm. Mild atherosclerotic plaque of the aorta and its branches. No abdominal, pelvic, or inguinal lymphadenopathy. Skull: No acute fracture or focal lesion. Sinuses/Orbits: Bilateral ethmoid and sphenoid mucosal thickening. Paranasal sinuses and mastoid air cells are clear. Bilateral lens replacement. Otherwise the orbits are unremarkable. Other: None. CT CERVICAL SPINE FINDINGS Alignment: Reversal of the normal cervical lordosis centered at the C3-C4 level likely due to degenerative changes and positioning. Skull base and vertebrae: Multilevel degenerative changes of the spine no acute fracture. No aggressive appearing focal osseous lesion or focal pathologic process. Soft tissues and spinal canal: No prevertebral fluid or swelling. No visible canal hematoma. Upper chest: Biapical paraseptal emphysematous changes. Other: Atherosclerotic plaque of the carotid arteries within the neck. IMPRESSION: 1. No acute intracranial abnormality. 2. No acute displaced fracture or traumatic listhesis of the cervical spine. 3.  Emphysema (ICD10-J43.9). Electronically Signed   By: Iven Finn M.D.   On: 11/18/2021 22:58    Procedures .Marland KitchenLaceration Repair  Date/Time: 11/19/2021 4:40 AM Performed by: Teressa Lower, MD Authorized by: Teressa Lower, MD   Laceration details:    Location:  Leg   Leg location:  R lower leg   Length (cm):  6 Exploration:    Wound extent: fascia violated and muscle damage     Contaminated: no   Treatment:    Area cleansed with:  Saline   Amount of cleaning:  Extensive   Irrigation solution:  Sterile saline   Irrigation volume:  1L   Irrigation method:  Pressure wash Skin repair:    Repair method:  Sutures   Suture size:  3-0   Suture material:  Prolene   Suture technique:  Horizontal mattress, figure eight and simple  interrupted   Number of sutures:  4 Approximation:    Approximation:  Close Repair type:    Repair type:  Intermediate Post-procedure details:    Dressing:  Non-adherent dressing   Procedure completion:  Tolerated well, no immediate complications   .Marland KitchenLaceration Repair  Date/Time: 11/19/2021 4:45 AM Performed by: Teressa Lower, MD Authorized by: Teressa Lower, MD   Laceration details:    Location:  Leg   Leg location:  R lower leg   Length (cm):  4 Exploration:    Wound extent: fascia violated and muscle damage     Contaminated: no   Treatment:    Area cleansed with:  Saline  Amount of cleaning:  Extensive   Irrigation solution:  Sterile saline   Irrigation volume:  1L   Irrigation method:  Pressure wash Skin repair:    Repair method:  Sutures   Suture size:  3-0   Suture material:  Prolene   Suture technique:  Horizontal mattress   Number of sutures:  3 Approximation:    Approximation:  Close Repair type:    Repair type:  Intermediate Post-procedure details:    Dressing:  Non-adherent dressing   Procedure completion:  Tolerated well, no immediate complications    Medications Ordered in ED Medications  lidocaine (PF) (XYLOCAINE) 1 % injection (  Given by Other 11/19/21 0358)    ED Course/ Medical Decision Making/ A&P                           Medical Decision Making  Patient seen emergency department for evaluation of lower extremity lacerations and a fall.  Physical exam reveals 2 gaping lower extremity lacerations over the right shin.  Trauma imaging was ordered from the lobby due to the patient's fall and I personally reviewed this imaging and found no abnormalities, this is confirmed by radiology.  Lacerations were repaired at bedside with difficulty as the patient's underlying A. fib requiring anticoagulation caused frequent bleeding into the wounds.  Lacerations repaired with a combination of horizontal mattresses, figure-of-eight stitches and simple  interrupted sutures.  Ultimately hemostasis achieved.  Patient then discharged with outpatient follow-up for suture removal in 7 days.    Final Clinical Impression(s) / ED Diagnoses Final diagnoses:  Laceration of right lower extremity, initial encounter  Fall, initial encounter    Rx / DC Orders ED Discharge Orders     None         Nate Perri, Debe Coder, MD 11/19/21 [phone]

## 2021-11-25 ENCOUNTER — Other Ambulatory Visit: Payer: Self-pay

## 2021-11-25 ENCOUNTER — Ambulatory Visit (INDEPENDENT_AMBULATORY_CARE_PROVIDER_SITE_OTHER): Payer: Medicare Other | Admitting: Nurse Practitioner

## 2021-11-25 VITALS — BP 132/54 | HR 58 | Temp 98.1°F | Resp 12 | Ht 67.0 in | Wt 167.5 lb

## 2021-11-25 DIAGNOSIS — S81811D Laceration without foreign body, right lower leg, subsequent encounter: Secondary | ICD-10-CM | POA: Diagnosis not present

## 2021-11-25 DIAGNOSIS — S81811A Laceration without foreign body, right lower leg, initial encounter: Secondary | ICD-10-CM | POA: Insufficient documentation

## 2021-11-25 NOTE — Progress Notes (Signed)
Acute Office Visit  Subjective:    Patient ID: [name], male    DOB: 1931/12/08, 88 y.o.   MRN: [id number]  Chief Complaint  Patient presents with   Follow up on leg injury-had stitches put in on right lower     Off and on has a stin sensation in the leg.    HPI Patient is in today for ED follow up and suture removal   Patient was seen in emergency department on 11/18/2021. 7 Sutures were placed and it was dressed. He was instructed to follow up with PCP for suture removal. Patient misunderstood and thought he was here for a dressing change. Patient had not changed his dressing in the 7 days since the accident  Past Medical History:  Diagnosis Date   Age-related macular degeneration, wet, both eyes ([place])    Anemia    Secondary to acute blood loss   Anginal pain ([place])    last chest pain in Feb 2021   Arthritis    "mild in hands, knees, ankles" (11/13/2015)   Atrial fibrillation ([place])    Consideration was given for atrial flutter ablation, but patient developed atrial fibrillation. Cardioversion was done. Dr. Caryl Comes decided to watch him clinically. November, 2011   Atrial flutter [facility]) 07/2010   September, 2011   Hospital with PNA and cath done.Marland KitchenMarland KitchenCoumadin.  Atrial flutter ablation planned, but  pt. then had atrial fibrillation,/outpatient conversion 09/08/10..NSR..plan to follow..Dr. Caryl Comes   CAD (coronary artery disease)    Catheterization, September, 2011,  grafts patent from redo CABG,, medical therapy of coronary disease, consideration to proceeding with atrial flutter ablation   Carotid artery disease ([place])    Doppler 09/18/2009 - 49% bilateral stenoses   Carotid artery disease ([name])    49% bilateral, Doppler, November, 2010   CHF (congestive heart failure) ([place])    Chronic kidney disease (CKD), stage III (moderate) ([place])    Diabetic peripheral neuropathy ([place])    feet   Diverticulosis of colon with hemorrhage 2009   several unit diverticular bleed    Erectile  dysfunction    Mild   Gout    Hearing loss    wears hearing aids   History of blood transfusion "several"   related to diverticular bleeding   Hyperlipidemia    Hypertension    Mitral regurgitation    Mild, echo, September, 2011   NSTEMI (non-ST elevated myocardial infarction) ([place]) 07/2010   at [facility] with repeat cath, rec medical mgmt    OSA on CPAP    uses CPAP nightly   Personal history of colonic polyps    PNA (pneumonia) 9/11   NSTEMI at [facility] with repeat cath, rec medical mgmt    RBBB (right bundle branch block)    S/P mitral valve repair 02/06/2020   s/p TEER with a single MitraClip NTW placed on A2/P2   Shoulder pain    "positional; better now" (11/13/2015)   Skin cancer    R lower leg, per derm 2012   Type II diabetes mellitus ([place])     Past Surgical History:  Procedure Laterality Date   CARDIAC CATHETERIZATION  2006   Nuclear..slight lateral ischemia..medical therapy   CARDIAC CATHETERIZATION  08/04/2010   grafts patent from redo CABG...medical Rx and ablate Atrial flutter (LV not injected)    CARDIOVERSION  ~ 2010   CAROTID ENDARTERECTOMY Right 1994   CATARACT EXTRACTION W/ INTRAOCULAR LENS  IMPLANT, BILATERAL Bilateral    COLON RESECTION N/A 01/13/2016   Procedure: EXPLORATORY  LAPAROTOMY, LEFT AND SIGMOID COLON REMOVAL;  Surgeon: Ralene Ok, MD;  Location: [place];  Service: General;  Laterality: N/A;  Extended open left hemicolectomy and sigmoidectomy    COLONOSCOPY N/A 11/14/2015   Procedure: COLONOSCOPY;  Surgeon: Manus Gunning, MD;  Location: [place];  Service: Gastroenterology;  Laterality: N/A;   COLONOSCOPY Left 01/05/2016   Procedure: COLONOSCOPY;  Surgeon: Manus Gunning, MD;  Location: [place];  Service: Gastroenterology;  Laterality: Left;  no sedation to start, moderate if needed   COLONOSCOPY W/ POLYPECTOMY     CORONARY ARTERY BYPASS GRAFT  1995; 2006   "X 3; X3"   CORONARY ARTERY BYPASS GRAFT  1995, 2006   DOPPLER  ECHOCARDIOGRAPHY  08/2008   EF 60%   DOPPLER ECHOCARDIOGRAPHY  08/02/2010   65-70%   DOPPLER ECHOCARDIOGRAPHY  07/2010   MR mild   ESOPHAGOGASTRODUODENOSCOPY N/A 06/19/2014   Procedure: ESOPHAGOGASTRODUODENOSCOPY (EGD);  Surgeon: Jerene Bears, MD;  Location: [facility] ENDOSCOPY;  Service: Endoscopy;  Laterality: N/A;   ESOPHAGOGASTRODUODENOSCOPY N/A 11/14/2015   Procedure: ESOPHAGOGASTRODUODENOSCOPY (EGD);  Surgeon: Manus Gunning, MD;  Location: [place];  Service: Gastroenterology;  Laterality: N/A;   FLEXIBLE SIGMOIDOSCOPY N/A 11/16/2015   Procedure: FLEXIBLE SIGMOIDOSCOPY;  Surgeon: Manus Gunning, MD;  Location: [facility] ENDOSCOPY;  Service: Gastroenterology;  Laterality: N/A;   LAPAROSCOPIC CHOLECYSTECTOMY  2008   LEFT HEART CATH N/A 06/14/2014   Procedure: LEFT HEART CATH;  Surgeon: Sinclair Grooms, MD;  Location: [facility] CATH LAB;  Service: Cardiovascular;  Laterality: N/A;   LEFT HEART CATH AND CORONARY ANGIOGRAPHY N/A 08/02/2018   Procedure: LEFT HEART CATH AND CORONARY ANGIOGRAPHY;  Surgeon: [name], MD;  Location: [place] CV LAB;  Service: Cardiovascular;  Laterality: N/A;   LEFT HEART CATHETERIZATION WITH CORONARY/GRAFT ANGIOGRAM N/A 06/11/2014   Procedure: LEFT HEART CATHETERIZATION WITH Beatrix Fetters;  Surgeon: Sinclair Grooms, MD;  Location: [facility] CATH LAB;  Service: Cardiovascular;  Laterality: N/A;   MITRAL VALVE REPAIR N/A 02/06/2020   Procedure: MITRAL VALVE REPAIR;  Surgeon: Sherren Mocha, MD;  Location: [place] CV LAB;  Service: Cardiovascular;  Laterality: N/A;   PERCUTANEOUS CORONARY STENT INTERVENTION (PCI-S) N/A 06/13/2014   Procedure: PERCUTANEOUS CORONARY STENT INTERVENTION (PCI-S);  Surgeon: Sinclair Grooms, MD;  Location: [facility] CATH LAB;  Service: Cardiovascular;  Laterality: N/A;   RIGHT HEART CATH N/A 01/06/2020   Procedure: RIGHT HEART CATH;  Surgeon: Jolaine Artist, MD;  Location: [place] CV LAB;  Service: Cardiovascular;   Laterality: N/A;   SKIN CANCER EXCISION Left 10/2015   calf   SKIN CANCER EXCISION Right 2014?   chest   TEE WITHOUT CARDIOVERSION N/A 01/06/2020   Procedure: TRANSESOPHAGEAL ECHOCARDIOGRAM (TEE);  Surgeon: Jolaine Artist, MD;  Location: [facility] ENDOSCOPY;  Service: Cardiovascular;  Laterality: N/A;   TONSILLECTOMY     VASECTOMY      Family History  Problem Relation Age of Onset   Kidney disease Mother        Kidney failure   Stroke Mother    Diabetes Mother    Heart disease Father        MI   Arthritis Sister    Cancer Sister        Throat   Heart disease Sister        MI   Diabetes Sister    Prostate cancer Neg Hx    Colon cancer Neg Hx     Social History   Socioeconomic History  Marital status: Widowed    Spouse name: Not on file   Number of children: 2   Years of education: Not on file   Highest education level: Not on file  Occupational History   Occupation: Retired Teacher, 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     Lives with girlfriend [name].  Widowed 12/2005.   Social Determinants of Health   Financial Resource Strain: Low Risk    Difficulty of Paying Living Expenses: Not hard at all  Food Insecurity: No Food Insecurity   Worried About Charity fundraiser in the Last Year: Never true   [place] in the Last Year: Never true  Transportation Needs: No Transportation Needs   Lack of  Transportation (Medical): No   Lack of Transportation (Non-Medical): No  Physical Activity: Insufficiently Active   Days of Exercise per Week: 3 days   Minutes of Exercise per Session: 10 min  Stress: No Stress Concern Present   Feeling of Stress : Not at all  Social Connections: Moderately Isolated   Frequency of Communication with Friends and Family: More than three times a week   Frequency of Social Gatherings with Friends and Family: More than three times a week   Attends Religious Services: Never   Marine scientist or Organizations: Yes   Attends Music therapist: More than 4 times per year   Marital Status: Widowed  Human resources officer Violence: Not At Risk   Fear of Current or Ex-Partner: No   Emotionally Abused: No   Physically Abused: No   Sexually Abused: No    Outpatient Medications Prior to Visit  Medication Sig Dispense Refill   allopurinol (ZYLOPRIM) 100 MG tablet TAKE ONE-HALF (1/2) TABLET EVERY MONDAY, WEDNESDAY AND FRIDAY 30 tablet 3   apixaban (ELIQUIS) 2.5 MG TABS tablet TAKE 1 TABLET TWICE A DAY 180 tablet 1   aspirin 81 MG tablet Take 81 mg by mouth daily.     BD INSULIN SYRINGE ULTRAFINE 31G X 5/16" 0.3 ML MISC USE DAILY AS INSTRUCTED 100 each 2   cetirizine (ZYRTEC) 10 MG tablet Take 10 mg by mouth daily as needed for allergies.     Cholecalciferol (VITAMIN D) 1000 UNITS capsule Take 1,000 Units by mouth daily.      diclofenac Sodium (VOLTAREN) 1 % GEL Apply 2 g topically 4 (four) times daily. 50 g 0   fish oil-omega-3 fatty acids 1000 MG capsule Take 1 g by mouth daily.     furosemide (LASIX) 80 MG tablet TAKE 1 TABLET TWICE A DAY (DOSAGE CHANGE) 180 tablet 3   glucose blood (FREESTYLE LITE) test strip USE TO TEST BLOOD SUGAR ONCE DAILY AND AS NEEDED FOR DIABETES MELLITUS 300 each 3   hydrALAZINE (  APRESOLINE) 25 MG tablet Take 1 tablet (25 mg total) by mouth in the morning and at bedtime. 180 tablet 3   ipratropium (ATROVENT) 0.03 % nasal  spray Place 2 sprays into both nostrils 2 (two) times daily as needed for rhinitis. 90 mL 3   JARDIANCE 10 MG TABS tablet TAKE 1 TABLET DAILY BEFORE BREAKFAST 90 tablet 3   LANTUS 100 UNIT/ML injection INJECT 20 TO 25 UNITS AT BEDTIME 40 mL 3   magnesium oxide (MAG-OX) 400 MG tablet Take 400 mg by mouth daily.     metoprolol tartrate (LOPRESSOR) 50 MG tablet Take 1 tablet (50 mg total) by mouth 2 (two) times daily. 90 tablet 1   Multiple Vitamin (MULTIVITAMIN WITH MINERALS) TABS Take 1 tablet by mouth daily.     nitroGLYCERIN (NITROSTAT) 0.4 MG SL tablet Place 1 tablet (0.4 mg total) under the tongue every 5 (five) minutes as needed for chest pain. 100 tablet 1   Potassium Gluconate 595 MG CAPS Take 595 mg by mouth daily.     ranolazine (RANEXA) 500 MG 12 hr tablet TAKE 1 TABLET TWICE A DAY (NEED OFFICE VISIT) 60 tablet 0   rosuvastatin (CRESTOR) 40 MG tablet TAKE 1 TABLET AT BEDTIME 90 tablet 3   traMADol (ULTRAM) 50 MG tablet Take 1 tablet (50 mg total) by mouth every 6 (six) hours as needed. (Patient not taking: Reported on 11/25/2021) 15 tablet 0   No facility-administered medications prior to visit.    No Known Allergies  Review of Systems  Constitutional:  Negative for chills and fever.  Gastrointestinal:  Negative for nausea and vomiting.  Skin:  Positive for color change and wound.  Neurological:  Negative for weakness and numbness.      Objective:    Physical Exam Constitutional:      Appearance: Normal appearance.  Cardiovascular:     Rate and Rhythm: Normal rate and regular rhythm.     Pulses: Normal pulses.     Heart sounds: Normal heart sounds.  Pulmonary:     Effort: Pulmonary effort is normal.     Breath sounds: Normal breath sounds.  Musculoskeletal:     Right lower leg: Edema present.  Skin:    General: Skin is warm.  Neurological:     Mental Status: He is alert. Mental status is at baseline.      BP (!) 132/54    Pulse (!) 58    Temp 98.1 F (36.7 C)     Resp 12    Ht 5\' 7"  (1.702 m)    Wt 167 lb 8 oz (76 kg)    SpO2 98%    BMI 26.23 kg/m  Wt Readings from Last 3 Encounters:  11/25/21 167 lb 8 oz (76 kg)  10/28/21 165 lb (74.8 kg)  07/05/21 169 lb (76.7 kg)    Health Maintenance Due  Topic Date Due   Zoster Vaccines- Shingrix (1 of 2) Never done   OPHTHALMOLOGY EXAM  04/14/2019   HEMOGLOBIN A1C  04/29/2021   FOOT EXAM  10/29/2021    There are no preventive care reminders to display for this patient.   Lab Results  Component Value Date   TSH 3.89 02/01/2019   Lab Results  Component Value Date   WBC 8.2 01/27/2021   HGB 13.9 01/27/2021   HCT 44.4 01/27/2021   MCV 99.1 01/27/2021   PLT 148 (L) 01/27/2021   Lab Results  Component Value Date   NA 136 01/27/2021   K  4.2 01/27/2021   CO2 31 01/27/2021   GLUCOSE 195 (H) 01/27/2021   BUN 29 (H) 01/27/2021   CREATININE 2.45 (H) 01/27/2021   BILITOT 0.8 10/29/2020   ALKPHOS 81 10/29/2020   AST 18 10/29/2020   ALT 15 10/29/2020   PROT 7.8 10/29/2020   ALBUMIN 4.0 10/29/2020   CALCIUM 9.7 01/27/2021   ANIONGAP 7 01/27/2021   GFR 19.79 (L) 10/29/2020   Lab Results  Component Value Date   CHOL 123 10/29/2020   Lab Results  Component Value Date   HDL 30.50 (L) 10/29/2020   Lab Results  Component Value Date   LDLCALC 62 10/29/2020   Lab Results  Component Value Date   TRIG 148.0 10/29/2020   Lab Results  Component Value Date   CHOLHDL 4 10/29/2020   Lab Results  Component Value Date   HGBA1C 9.3 (H) 10/29/2020       Assessment & Plan:   Problem List Items Addressed This Visit       Other   Laceration of right lower extremity - Primary    Patient here for 7-day follow-up and suture removal to right lower extremity laceration.  Patient had not changed bandage since being seen in the emergency department.  Had to use copious amounts of sterile irrigation fluid to remove bandage from leg see clinical picture do not feel is appropriate to remove sutures at  this time patient has had bypass surgery with vein stripping and is diabetic.  We will have him follow-up on Monday for wound recheck and suture removal.  SPECT he will have extended healing time given comorbidities and age.  Did redress wound after cleaning with nonadherent and Kerlix wrap.  Patient instructed to change bandage every day clean gently with soap and water pat dry allow skin to have air if possible, rewrap with nonadherent and Kerlix before going to bed.  Signs and symptoms reviewed as when to seek urgent or emergent health care.  Patient acknowledged        No orders of the defined types were placed in this encounter.    Romilda Garret, NP

## 2021-11-25 NOTE — Assessment & Plan Note (Signed)
Patient here for 7-day follow-up and suture removal to right lower extremity laceration.  Patient had not changed bandage since being seen in the emergency department.  Had to use copious amounts of sterile irrigation fluid to remove bandage from leg see clinical picture do not feel is appropriate to remove sutures at this time patient has had bypass surgery with vein stripping and is diabetic.  We will have him follow-up on Monday for wound recheck and suture removal.  SPECT he will have extended healing time given comorbidities and age.  Did redress wound after cleaning with nonadherent and Kerlix wrap.  Patient instructed to change bandage every day clean gently with soap and water pat dry allow skin to have air if possible, rewrap with nonadherent and Kerlix before going to bed.  Signs and symptoms reviewed as when to seek urgent or emergent health care.  Patient acknowledged

## 2021-11-25 NOTE — Patient Instructions (Addendum)
You need to change the dressing daily. Clean it GENTLY with soap and water and pat dry Can leave the dressing off for some time while you are sitting at home doing nothing. But cover it back up before going out or going to bed for the night  I will need to see you Monday for the suture removal.

## 2021-11-26 ENCOUNTER — Other Ambulatory Visit: Payer: Self-pay | Admitting: Cardiovascular Disease

## 2021-11-29 ENCOUNTER — Ambulatory Visit (INDEPENDENT_AMBULATORY_CARE_PROVIDER_SITE_OTHER): Payer: Medicare Other | Admitting: Nurse Practitioner

## 2021-11-29 ENCOUNTER — Telehealth: Payer: Self-pay

## 2021-11-29 ENCOUNTER — Other Ambulatory Visit: Payer: Self-pay

## 2021-11-29 VITALS — BP 138/56 | HR 88 | Temp 98.1°F | Resp 14 | Ht 67.0 in | Wt 170.1 lb

## 2021-11-29 DIAGNOSIS — Z4802 Encounter for removal of sutures: Secondary | ICD-10-CM | POA: Diagnosis not present

## 2021-11-29 DIAGNOSIS — L089 Local infection of the skin and subcutaneous tissue, unspecified: Secondary | ICD-10-CM | POA: Insufficient documentation

## 2021-11-29 DIAGNOSIS — T148XXA Other injury of unspecified body region, initial encounter: Secondary | ICD-10-CM

## 2021-11-29 MED ORDER — DOXYCYCLINE HYCLATE 100 MG PO TABS: 100.0000 mg | ORAL_TABLET | Freq: Two times a day (BID) | ORAL | 0 refills | Status: AC

## 2021-11-29 MED ORDER — CEFTRIAXONE SODIUM 1 G IJ SOLR
1.0000 g | Freq: Once | INTRAMUSCULAR | Status: AC
Start: 2021-11-29 — End: 2021-11-29
  Administered 2021-11-29: 1 g via INTRAMUSCULAR

## 2021-11-29 NOTE — Telephone Encounter (Signed)
Patient dropped off handicap form to be filled out. He was seen for wound check by Romilda Garret NP this morning and left the form. Form placed in Dr Josefine Class inbox for review.

## 2021-11-29 NOTE — Assessment & Plan Note (Signed)
Patient wound looks worse in clinic today.  Having outstretching erythema edema and pain.  Did give 1 g Rocephin IM in office.  We will start patient on doxycycline 100 mg twice daily for 7 days.  Did do urgent referral to wound clinic but unlikely for patient to get in.  If he is unable to get an urgent appointment he will follow-up with me in 3 days for wound management/recheck.  Signs and symptoms reviewed when to seek urgent or emergent health care.

## 2021-11-29 NOTE — Patient Instructions (Signed)
Nice to see you today We got all the stitches out.  I placed a referral for you to see them.  If you cannot get in with them this week I will need to see you back in 3 days

## 2021-11-29 NOTE — Assessment & Plan Note (Signed)
Patient presents to clinic to have wound reevaluated and sutures removal.  I was able to remove all 7 sutures that was documented in ED note.  Patient tolerated procedure well

## 2021-11-29 NOTE — Progress Notes (Signed)
Established Patient Office Visit  Subjective:  Patient ID: [name], male    DOB: [birth year]  Age: 88 y.o. MRN: [id number]  CC:  Chief Complaint  Patient presents with   Wound Check    Feels like it is getting worse/not better. Has been changing his dressing     HPI [name] presents for Wound recheck.  Patient was seen in the ED on 11/18/2021 for a laceration to right lower extremity. There were 7 sutures placed and wound cleaned and dressed. Patient was discharged.  Patient saw me in office on 11/25/2021. He thought he was in office for a dressing change vs suture removal. The patient did not change the wound dressing the entire week. After copious irrigation the dressing was removed. The wound did not look ready to have the stitches removed. He was told to follow up on Monday 11/29/2021  Patient here today for suture removal. He has increased swelling to the wound and increased pain and redness. He is a diabetic along with having vessel stripping for a CABG in the past. All 7 sutures were removed  Past Medical History:  Diagnosis Date   Age-related macular degeneration, wet, both eyes ([name])    Anemia    Secondary to acute blood loss   Anginal pain ([place])    last chest pain in Feb 2021   Arthritis    "mild in hands, knees, ankles" (11/13/2015)   Atrial fibrillation ([place])    Consideration was given for atrial flutter ablation, but patient developed atrial fibrillation. Cardioversion was done. Dr. Caryl Comes decided to watch him clinically. November, 2011   Atrial flutter [facility]) 07/2010   September, 2011   Hospital with PNA and cath done.Marland KitchenMarland KitchenCoumadin.  Atrial flutter ablation planned, but  pt. then had atrial fibrillation,/outpatient conversion 09/08/10..NSR..plan to follow..Dr. Caryl Comes   CAD (coronary artery disease)    Catheterization, September, 2011,  grafts patent from redo CABG,, medical therapy of coronary disease, consideration to proceeding with atrial flutter ablation    Carotid artery disease ([place])    Doppler 09/18/2009 - 49% bilateral stenoses   Carotid artery disease ([name])    49% bilateral, Doppler, November, 2010   CHF (congestive heart failure) ([place])    Chronic kidney disease (CKD), stage III (moderate) ([place])    Diabetic peripheral neuropathy ([place])    feet   Diverticulosis of colon with hemorrhage 2009   several unit diverticular bleed    Erectile dysfunction    Mild   Gout    Hearing loss    wears hearing aids   History of blood transfusion "several"   related to diverticular bleeding   Hyperlipidemia    Hypertension    Mitral regurgitation    Mild, echo, September, 2011   NSTEMI (non-ST elevated myocardial infarction) ([place]) 07/2010   at [facility] with repeat cath, rec medical mgmt    OSA on CPAP    uses CPAP nightly   Personal history of colonic polyps    PNA (pneumonia) 9/11   NSTEMI at [facility] with repeat cath, rec medical mgmt    RBBB (right bundle branch block)    S/P mitral valve repair 02/06/2020   s/p TEER with a single MitraClip NTW placed on A2/P2   Shoulder pain    "positional; better now" (11/13/2015)   Skin cancer    R lower leg, per derm 2012   Type II diabetes mellitus ([place])     Past Surgical History:  Procedure Laterality Date   CARDIAC CATHETERIZATION  2006   Nuclear..slight lateral ischemia..medical therapy   CARDIAC CATHETERIZATION  08/04/2010   grafts patent from redo CABG...medical Rx and ablate Atrial flutter (LV not injected)    CARDIOVERSION  ~ 2010   CAROTID ENDARTERECTOMY Right 1994   CATARACT EXTRACTION W/ INTRAOCULAR LENS  IMPLANT, BILATERAL Bilateral    COLON RESECTION N/A 01/13/2016   Procedure: EXPLORATORY LAPAROTOMY, LEFT AND SIGMOID COLON REMOVAL;  Surgeon: Ralene Ok, MD;  Location: [place];  Service: General;  Laterality: N/A;  Extended open left hemicolectomy and sigmoidectomy    COLONOSCOPY N/A 11/14/2015   Procedure: COLONOSCOPY;  Surgeon: Manus Gunning, MD;  Location: [place];   Service: Gastroenterology;  Laterality: N/A;   COLONOSCOPY Left 01/05/2016   Procedure: COLONOSCOPY;  Surgeon: Manus Gunning, MD;  Location: [place];  Service: Gastroenterology;  Laterality: Left;  no sedation to start, moderate if needed   COLONOSCOPY W/ POLYPECTOMY     CORONARY ARTERY BYPASS GRAFT  1995; 2006   "X 3; X3"   CORONARY ARTERY BYPASS GRAFT  1995, 2006   DOPPLER ECHOCARDIOGRAPHY  08/2008   EF 60%   DOPPLER ECHOCARDIOGRAPHY  08/02/2010   65-70%   DOPPLER ECHOCARDIOGRAPHY  07/2010   MR mild   ESOPHAGOGASTRODUODENOSCOPY N/A 06/19/2014   Procedure: ESOPHAGOGASTRODUODENOSCOPY (EGD);  Surgeon: Jerene Bears, MD;  Location: [facility] ENDOSCOPY;  Service: Endoscopy;  Laterality: N/A;   ESOPHAGOGASTRODUODENOSCOPY N/A 11/14/2015   Procedure: ESOPHAGOGASTRODUODENOSCOPY (EGD);  Surgeon: Manus Gunning, MD;  Location: [place];  Service: Gastroenterology;  Laterality: N/A;   FLEXIBLE SIGMOIDOSCOPY N/A 11/16/2015   Procedure: FLEXIBLE SIGMOIDOSCOPY;  Surgeon: Manus Gunning, MD;  Location: [facility] ENDOSCOPY;  Service: Gastroenterology;  Laterality: N/A;   LAPAROSCOPIC CHOLECYSTECTOMY  2008   LEFT HEART CATH N/A 06/14/2014   Procedure: LEFT HEART CATH;  Surgeon: Sinclair Grooms, MD;  Location: [facility] CATH LAB;  Service: Cardiovascular;  Laterality: N/A;   LEFT HEART CATH AND CORONARY ANGIOGRAPHY N/A 08/02/2018   Procedure: LEFT HEART CATH AND CORONARY ANGIOGRAPHY;  Surgeon: [name], MD;  Location: [place] CV LAB;  Service: Cardiovascular;  Laterality: N/A;   LEFT HEART CATHETERIZATION WITH CORONARY/GRAFT ANGIOGRAM N/A 06/11/2014   Procedure: LEFT HEART CATHETERIZATION WITH Beatrix Fetters;  Surgeon: Sinclair Grooms, MD;  Location: [facility] CATH LAB;  Service: Cardiovascular;  Laterality: N/A;   MITRAL VALVE REPAIR N/A 02/06/2020   Procedure: MITRAL VALVE REPAIR;  Surgeon: Sherren Mocha, MD;  Location: [place] CV LAB;  Service: Cardiovascular;  Laterality: N/A;    PERCUTANEOUS CORONARY STENT INTERVENTION (PCI-S) N/A 06/13/2014   Procedure: PERCUTANEOUS CORONARY STENT INTERVENTION (PCI-S);  Surgeon: Sinclair Grooms, MD;  Location: [facility] CATH LAB;  Service: Cardiovascular;  Laterality: N/A;   RIGHT HEART CATH N/A 01/06/2020   Procedure: RIGHT HEART CATH;  Surgeon: Jolaine Artist, MD;  Location: [place] CV LAB;  Service: Cardiovascular;  Laterality: N/A;   SKIN CANCER EXCISION Left 10/2015   calf   SKIN CANCER EXCISION Right 2014?   chest   TEE WITHOUT CARDIOVERSION N/A 01/06/2020   Procedure: TRANSESOPHAGEAL ECHOCARDIOGRAM (TEE);  Surgeon: Jolaine Artist, MD;  Location: [facility] ENDOSCOPY;  Service: Cardiovascular;  Laterality: N/A;   TONSILLECTOMY     VASECTOMY      Family History  Problem Relation Age of Onset   Kidney disease Mother        Kidney failure   Stroke Mother    Diabetes Mother    Heart disease Father  MI   Arthritis Sister    Cancer Sister        Throat   Heart disease Sister        MI   Diabetes Sister    Prostate cancer Neg Hx    Colon cancer Neg Hx     Social History   Socioeconomic History   Marital status: Widowed    Spouse name: Not on file   Number of children: 2   Years of education: Not on file   Highest education level: Not on file  Occupational History   Occupation: Retired Teacher, 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     Lives with  girlfriend [name].  Widowed 12/2005.   Social Determinants of Health   Financial Resource Strain: Low Risk    Difficulty of Paying Living Expenses: Not hard at all  Food Insecurity: No Food Insecurity   Worried About Charity fundraiser in the Last Year: Never true   [place] in the Last Year: Never true  Transportation Needs: No Transportation Needs   Lack of Transportation (Medical): No   Lack of Transportation (Non-Medical): No  Physical Activity: Insufficiently Active   Days of Exercise per Week: 3 days   Minutes of Exercise per Session: 10 min  Stress: No Stress Concern Present   Feeling of Stress : Not at all  Social Connections: Moderately Isolated   Frequency of Communication with Friends and Family: More than three times a week   Frequency of Social Gatherings with Friends and Family: More than three times a week   Attends Religious Services: Never   Marine scientist or Organizations: Yes   Attends Music therapist: More than 4 times per year   Marital Status: Widowed  Human resources officer Violence: Not At Risk   Fear of Current or Ex-Partner: No   Emotionally Abused: No   Physically Abused: No   Sexually Abused: No    Outpatient Medications Prior to Visit  Medication Sig Dispense Refill   allopurinol (ZYLOPRIM) 100 MG tablet TAKE ONE-HALF (1/2) TABLET EVERY MONDAY, WEDNESDAY AND FRIDAY 30 tablet 3   apixaban (ELIQUIS) 2.5 MG TABS tablet TAKE 1 TABLET TWICE A DAY 180 tablet 1   aspirin 81 MG tablet Take 81 mg by mouth daily.     BD INSULIN SYRINGE ULTRAFINE 31G X 5/16" 0.3 ML MISC USE DAILY AS INSTRUCTED 100 each 2   cetirizine (ZYRTEC) 10 MG tablet Take 10 mg by mouth daily as needed for allergies.     Cholecalciferol (VITAMIN D) 1000 UNITS capsule Take 1,000 Units by mouth daily.      diclofenac Sodium (VOLTAREN) 1 % GEL Apply 2 g topically 4 (four) times daily. 50 g 0  fish oil-omega-3 fatty acids 1000 MG capsule Take 1 g by mouth daily.      furosemide (LASIX) 80 MG tablet TAKE 1 TABLET TWICE A DAY (DOSAGE CHANGE) 180 tablet 3   glucose blood (FREESTYLE LITE) test strip USE TO TEST BLOOD SUGAR ONCE DAILY AND AS NEEDED FOR DIABETES MELLITUS 300 each 3   hydrALAZINE (APRESOLINE) 25 MG tablet Take 1 tablet (25 mg total) by mouth in the morning and at bedtime. 180 tablet 3   ipratropium (ATROVENT) 0.03 % nasal spray Place 2 sprays into both nostrils 2 (two) times daily as needed for rhinitis. 90 mL 3   JARDIANCE 10 MG TABS tablet TAKE 1 TABLET DAILY BEFORE BREAKFAST 90 tablet 3   LANTUS 100 UNIT/ML injection INJECT 20 TO 25 UNITS AT BEDTIME 40 mL 3   magnesium oxide (MAG-OX) 400 MG tablet Take 400 mg by mouth daily.     metoprolol tartrate (LOPRESSOR) 50 MG tablet TAKE 1 TABLET TWICE A DAY 90 tablet 1   Multiple Vitamin (MULTIVITAMIN WITH MINERALS) TABS Take 1 tablet by mouth daily.     nitroGLYCERIN (NITROSTAT) 0.4 MG SL tablet Place 1 tablet (0.4 mg total) under the tongue every 5 (five) minutes as needed for chest pain. 100 tablet 1   Potassium Gluconate 595 MG CAPS Take 595 mg by mouth daily.     ranolazine (RANEXA) 500 MG 12 hr tablet TAKE 1 TABLET TWICE A DAY (NEED OFFICE VISIT) 60 tablet 0   rosuvastatin (CRESTOR) 40 MG tablet TAKE 1 TABLET AT BEDTIME 90 tablet 3   traMADol (ULTRAM) 50 MG tablet Take 1 tablet (50 mg total) by mouth every 6 (six) hours as needed. 15 tablet 0   No facility-administered medications prior to visit.    No Known Allergies  ROS Review of Systems  Constitutional:  Negative for chills and fever.  Cardiovascular:  Positive for leg swelling.  Gastrointestinal:  Negative for nausea and vomiting.  Skin:  Positive for color change and wound.  Neurological:        Sensation disturbances "-"  Hematological:  Bruises/bleeds easily.     Objective:    Physical Exam Vitals and nursing note reviewed.  Constitutional:      General: He is not in acute distress.    Appearance: Normal appearance. He  is not ill-appearing.  Cardiovascular:     Rate and Rhythm: Normal rate and regular rhythm.     Pulses: Normal pulses.     Heart sounds: Normal heart sounds.  Pulmonary:     Effort: Pulmonary effort is normal.     Breath sounds: Normal breath sounds.  Musculoskeletal:     Comments: Calf supple and non tender  Skin:    General: Skin is warm.     Findings: Erythema and lesion present.     Comments: See clinical picture.  4 inches in length  Neurological:     Mental Status: He is alert.        BP (!) 138/56    Pulse 88    Temp 98.1 F (36.7 C)    Resp 14    Ht 5\' 7"  (1.702 m)    Wt 170 lb 2 oz (77.2 kg)    SpO2 98%    BMI 26.65 kg/m  Wt Readings from Last 3 Encounters:  11/29/21 170 lb 2 oz (77.2 kg)  11/25/21 167 lb 8 oz (76 kg)  10/28/21 165 lb (74.8 kg)     Health Maintenance Due  Topic Date  Due   Zoster Vaccines- Shingrix (1 of 2) Never done   OPHTHALMOLOGY EXAM  04/14/2019   HEMOGLOBIN A1C  04/29/2021   FOOT EXAM  10/29/2021    There are no preventive care reminders to display for this patient.  Lab Results  Component Value Date   TSH 3.89 02/01/2019   Lab Results  Component Value Date   WBC 8.2 01/27/2021   HGB 13.9 01/27/2021   HCT 44.4 01/27/2021   MCV 99.1 01/27/2021   PLT 148 (L) 01/27/2021   Lab Results  Component Value Date   NA 136 01/27/2021   K 4.2 01/27/2021   CO2 31 01/27/2021   GLUCOSE 195 (H) 01/27/2021   BUN 29 (H) 01/27/2021   CREATININE 2.45 (H) 01/27/2021   BILITOT 0.8 10/29/2020   ALKPHOS 81 10/29/2020   AST 18 10/29/2020   ALT 15 10/29/2020   PROT 7.8 10/29/2020   ALBUMIN 4.0 10/29/2020   CALCIUM 9.7 01/27/2021   ANIONGAP 7 01/27/2021   GFR 19.79 (L) 10/29/2020   Lab Results  Component Value Date   CHOL 123 10/29/2020   Lab Results  Component Value Date   HDL 30.50 (L) 10/29/2020   Lab Results  Component Value Date   LDLCALC 62 10/29/2020   Lab Results  Component Value Date   TRIG 148.0 10/29/2020   Lab  Results  Component Value Date   CHOLHDL 4 10/29/2020   Lab Results  Component Value Date   HGBA1C 9.3 (H) 10/29/2020      Assessment & Plan:   Problem List Items Addressed This Visit       Other   Wound infection - Primary    Patient wound looks worse in clinic today.  Having outstretching erythema edema and pain.  Did give 1 g Rocephin IM in office.  We will start patient on doxycycline 100 mg twice daily for 7 days.  Did do urgent referral to wound clinic but unlikely for patient to get in.  If he is unable to get an urgent appointment he will follow-up with me in 3 days for wound management/recheck.  Signs and symptoms reviewed when to seek urgent or emergent health care.      Relevant Medications   doxycycline (VIBRA-TABS) 100 MG tablet   Other Relevant Orders   WOUND CULTURE   Ambulatory referral to Wound Clinic   Encounter for removal of sutures    Patient presents to clinic to have wound reevaluated and sutures removal.  I was able to remove all 7 sutures that was documented in ED note.  Patient tolerated procedure well       Meds ordered this encounter  Medications   cefTRIAXone (ROCEPHIN) injection 1 g   doxycycline (VIBRA-TABS) 100 MG tablet    Sig: Take 1 tablet (100 mg total) by mouth 2 (two) times daily for 7 days.    Dispense:  14 tablet    Refill:  0    Order Specific Question:   Supervising Provider    Answer:   Loura Pardon A [[id number]]    Follow-up: 3 days if he is not able to be seen in wound clinic    Romilda Garret, NP

## 2021-11-30 NOTE — Telephone Encounter (Signed)
I'll work on it.  Thanks.  

## 2021-12-02 ENCOUNTER — Ambulatory Visit (INDEPENDENT_AMBULATORY_CARE_PROVIDER_SITE_OTHER): Payer: Medicare Other | Admitting: Nurse Practitioner

## 2021-12-02 ENCOUNTER — Other Ambulatory Visit: Payer: Self-pay

## 2021-12-02 VITALS — BP 136/50 | HR 65 | Temp 97.9°F | Resp 16 | Ht 67.0 in | Wt 167.5 lb

## 2021-12-02 DIAGNOSIS — T148XXA Other injury of unspecified body region, initial encounter: Secondary | ICD-10-CM | POA: Diagnosis not present

## 2021-12-02 DIAGNOSIS — L089 Local infection of the skin and subcutaneous tissue, unspecified: Secondary | ICD-10-CM

## 2021-12-02 DIAGNOSIS — B958 Unspecified staphylococcus as the cause of diseases classified elsewhere: Secondary | ICD-10-CM | POA: Diagnosis not present

## 2021-12-02 LAB — WOUND CULTURE
MICRO NUMBER:: 12875450
SPECIMEN QUALITY:: ADEQUATE

## 2021-12-02 LAB — CBC WITH DIFFERENTIAL/PLATELET
Basophils Absolute: 0 10*3/uL (ref 0.0–0.1)
Basophils Relative: 0.5 % (ref 0.0–3.0)
Eosinophils Absolute: 0.4 10*3/uL (ref 0.0–0.7)
Eosinophils Relative: 5.4 % — ABNORMAL HIGH (ref 0.0–5.0)
HCT: 34.7 % — ABNORMAL LOW (ref 39.0–52.0)
Hemoglobin: 11.3 g/dL — ABNORMAL LOW (ref 13.0–17.0)
Lymphocytes Relative: 20.1 % (ref 12.0–46.0)
Lymphs Abs: 1.4 10*3/uL (ref 0.7–4.0)
MCHC: 32.6 g/dL (ref 30.0–36.0)
MCV: 92.7 fl (ref 78.0–100.0)
Monocytes Absolute: 0.8 10*3/uL (ref 0.1–1.0)
Monocytes Relative: 10.9 % (ref 3.0–12.0)
Neutro Abs: 4.5 10*3/uL (ref 1.4–7.7)
Neutrophils Relative %: 63.1 % (ref 43.0–77.0)
Platelets: 173 10*3/uL (ref 150.0–400.0)
RBC: 3.74 Mil/uL — ABNORMAL LOW (ref 4.22–5.81)
RDW: 16.2 % — ABNORMAL HIGH (ref 11.5–15.5)
WBC: 7.1 10*3/uL (ref 4.0–10.5)

## 2021-12-02 NOTE — Assessment & Plan Note (Addendum)
Patient responded well to IM injection of Rocephin last office visit is tolerating oral doxycycline well.  Patient states he has medications to last through Sunday.  Encourage patient to continue taking antibiotics as the wound is looking better.  Did try to get patient into wound center as it looks like there may be some eschar in the wound but could benefit from debridement.  [place] wound clinic is backed out for approximately 1 month we will see if we can get him into the [facility] wound center little sooner.  Patient will follow-up with me in 1 week during the meantime  Did unwrap and clean wound with irrigation solution patted dry and redressed with nonadherent pad and Kerlix wrap.

## 2021-12-02 NOTE — Assessment & Plan Note (Signed)
Culture returned stating Staph aureus infection.  Sensitivity also resulted in patient's infection sensitive to tetracyclines.  Patient was placed on doxycycline last office visit.  Continue antibiotic medication as prescribed and follow-up as recommended.  Signs and symptoms reviewed when to seek urgent or emergent health care.  Patient is not having any systemic symptoms but given the size of the wound and patient's age I was going to draw blood cultures in office also alerted that we do not have the supplies for blood culture drawl and they were on backorder.

## 2021-12-02 NOTE — Patient Instructions (Signed)
Nice to see you today I will see you in a week I will reach out to [facility] for appointments for the wound clinic

## 2021-12-02 NOTE — Progress Notes (Signed)
Acute Office Visit  Subjective:    Patient ID: EMILY FORSE, male    DOB: 02/12/1932, 88 y.o.   MRN: [id number]  Chief Complaint  Patient presents with   follow up on leg wound    Feels better, not as painful and not as red.    HPI Patient is in today for Wound recheck  Patient was seen in the emergency department on 11/18/2021 were 7 sutures were placed and wound was dressed. He subsequently saw me on 11/25/2021 for what he presumed to be a dressing change. He was suppose to have sutures removed but given the appearance I left them in and changed his bandage that had not been changed in 7 days and gave him wound care instructions.  Saw patient back in office on 11/29/2021 was able to remove all 7 stitches and the wound looked to be infected. Obtained a wound culture and cleaned/redressed wound. Gave 1 g Rocephin IM in office and started patient on doxycycline.  Patient in office today for wound check. It has improved and the warm has abated and the pain has improved per patient report. Wound culture did result with Staph Aureus that was sensitive to tetracyclines.  Past Medical History:  Diagnosis Date   Age-related macular degeneration, wet, both eyes ([place])    Anemia    Secondary to acute blood loss   Anginal pain ([name])    last chest pain in Feb 2021   Arthritis    "mild in hands, knees, ankles" (11/13/2015)   Atrial fibrillation ([place])    Consideration was given for atrial flutter ablation, but patient developed atrial fibrillation. Cardioversion was done. Dr. Caryl Comes decided to watch him clinically. November, 2011   Atrial flutter [facility]) 07/2010   September, 2011   Hospital with PNA and cath done.Marland KitchenMarland KitchenCoumadin.  Atrial flutter ablation planned, but  pt. then had atrial fibrillation,/outpatient conversion 09/08/10..NSR..plan to follow..Dr. Caryl Comes   CAD (coronary artery disease)    Catheterization, September, 2011,  grafts patent from redo CABG,, medical therapy of coronary disease,  consideration to proceeding with atrial flutter ablation   Carotid artery disease ([place])    Doppler 09/18/2009 - 49% bilateral stenoses   Carotid artery disease ([name])    49% bilateral, Doppler, November, 2010   CHF (congestive heart failure) ([place])    Chronic kidney disease (CKD), stage III (moderate) ([place])    Diabetic peripheral neuropathy ([place])    feet   Diverticulosis of colon with hemorrhage 2009   several unit diverticular bleed    Erectile dysfunction    Mild   Gout    Hearing loss    wears hearing aids   History of blood transfusion "several"   related to diverticular bleeding   Hyperlipidemia    Hypertension    Mitral regurgitation    Mild, echo, September, 2011   NSTEMI (non-ST elevated myocardial infarction) ([place]) 07/2010   at [facility] with repeat cath, rec medical mgmt    OSA on CPAP    uses CPAP nightly   Personal history of colonic polyps    PNA (pneumonia) 9/11   NSTEMI at [facility] with repeat cath, rec medical mgmt    RBBB (right bundle branch block)    S/P mitral valve repair 02/06/2020   s/p TEER with a single MitraClip NTW placed on A2/P2   Shoulder pain    "positional; better now" (11/13/2015)   Skin cancer    R lower leg, per derm 2012   Type II diabetes mellitus ([place])  Past Surgical History:  Procedure Laterality Date   CARDIAC CATHETERIZATION  2006   Nuclear..slight lateral ischemia..medical therapy   CARDIAC CATHETERIZATION  08/04/2010   grafts patent from redo CABG...medical Rx and ablate Atrial flutter (LV not injected)    CARDIOVERSION  ~ 2010   CAROTID ENDARTERECTOMY Right 1994   CATARACT EXTRACTION W/ INTRAOCULAR LENS  IMPLANT, BILATERAL Bilateral    COLON RESECTION N/A 01/13/2016   Procedure: EXPLORATORY LAPAROTOMY, LEFT AND SIGMOID COLON REMOVAL;  Surgeon: Ralene Ok, MD;  Location: [place];  Service: General;  Laterality: N/A;  Extended open left hemicolectomy and sigmoidectomy    COLONOSCOPY N/A 11/14/2015   Procedure: COLONOSCOPY;  Surgeon:  Manus Gunning, MD;  Location: [place];  Service: Gastroenterology;  Laterality: N/A;   COLONOSCOPY Left 01/05/2016   Procedure: COLONOSCOPY;  Surgeon: Manus Gunning, MD;  Location: [place];  Service: Gastroenterology;  Laterality: Left;  no sedation to start, moderate if needed   COLONOSCOPY W/ POLYPECTOMY     CORONARY ARTERY BYPASS GRAFT  1995; 2006   "X 3; X3"   CORONARY ARTERY BYPASS GRAFT  1995, 2006   DOPPLER ECHOCARDIOGRAPHY  08/2008   EF 60%   DOPPLER ECHOCARDIOGRAPHY  08/02/2010   65-70%   DOPPLER ECHOCARDIOGRAPHY  07/2010   MR mild   ESOPHAGOGASTRODUODENOSCOPY N/A 06/19/2014   Procedure: ESOPHAGOGASTRODUODENOSCOPY (EGD);  Surgeon: Jerene Bears, MD;  Location: [facility] ENDOSCOPY;  Service: Endoscopy;  Laterality: N/A;   ESOPHAGOGASTRODUODENOSCOPY N/A 11/14/2015   Procedure: ESOPHAGOGASTRODUODENOSCOPY (EGD);  Surgeon: Manus Gunning, MD;  Location: [place];  Service: Gastroenterology;  Laterality: N/A;   FLEXIBLE SIGMOIDOSCOPY N/A 11/16/2015   Procedure: FLEXIBLE SIGMOIDOSCOPY;  Surgeon: Manus Gunning, MD;  Location: [facility] ENDOSCOPY;  Service: Gastroenterology;  Laterality: N/A;   LAPAROSCOPIC CHOLECYSTECTOMY  2008   LEFT HEART CATH N/A 06/14/2014   Procedure: LEFT HEART CATH;  Surgeon: Sinclair Grooms, MD;  Location: [facility] CATH LAB;  Service: Cardiovascular;  Laterality: N/A;   LEFT HEART CATH AND CORONARY ANGIOGRAPHY N/A 08/02/2018   Procedure: LEFT HEART CATH AND CORONARY ANGIOGRAPHY;  Surgeon: [name], MD;  Location: [place] CV LAB;  Service: Cardiovascular;  Laterality: N/A;   LEFT HEART CATHETERIZATION WITH CORONARY/GRAFT ANGIOGRAM N/A 06/11/2014   Procedure: LEFT HEART CATHETERIZATION WITH Beatrix Fetters;  Surgeon: Sinclair Grooms, MD;  Location: [facility] CATH LAB;  Service: Cardiovascular;  Laterality: N/A;   MITRAL VALVE REPAIR N/A 02/06/2020   Procedure: MITRAL VALVE REPAIR;  Surgeon: Sherren Mocha, MD;  Location: [place] CV LAB;  Service: Cardiovascular;  Laterality: N/A;   PERCUTANEOUS CORONARY STENT INTERVENTION (PCI-S) N/A 06/13/2014   Procedure: PERCUTANEOUS CORONARY STENT INTERVENTION (PCI-S);  Surgeon: Sinclair Grooms, MD;  Location: [facility] C[facility] LAB;  Service: Cardiovascular;  Laterality: N/A;   RIGHT HEART CATH N/A 01/06/2020   Procedure: RIGHT HEART CATH;  Surgeon: Jolaine Artist, MD;  Location: [place] CV LAB;  Service: Cardiovascular;  Laterality: N/A;   SKIN CANCER EXCISION Left 10/2015   calf   SKIN CANCER EXCISION Right 2014?   chest   TEE WITHOUT CARDIOVERSION N/A 01/06/2020   Procedure: TRANSESOPHAGEAL ECHOCARDIOGRAM (TEE);  Surgeon: Jolaine Artist, MD;  Location: [facility] ENDOSCOPY;  Service: Cardiovascular;  Laterality: N/A;   TONSILLECTOMY     VASECTOMY      Family History  Problem Relation Age of Onset   Kidney disease Mother        Kidney failure   Stroke Mother  Diabetes Mother    Heart disease Father        MI   Arthritis Sister    Cancer Sister        Throat   Heart disease Sister        MI   Diabetes Sister    Prostate cancer Neg Hx    Colon cancer Neg Hx     Social History   Socioeconomic History   Marital status: Widowed    Spouse name: Not on file   Number of children: 2   Years of education: Not on file   Highest education level: Not on file  Occupational History   Occupation: Retired Teacher, 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     Lives with girlfriend [name].  Widowed 12/2005.   Social Determinants of Health   Financial Resource Strain: Low Risk    Difficulty of Paying Living Expenses: Not hard at all  Food Insecurity: No Food Insecurity   Worried About Charity fundraiser in the Last Year: Never true   [place] in the Last Year: Never true  Transportation Needs: No Transportation Needs   Lack of Transportation (Medical): No   Lack of Transportation (Non-Medical): No  Physical Activity: Insufficiently Active   Days of Exercise per Week: 3 days   Minutes of Exercise per Session: 10 min  Stress: No Stress Concern Present   Feeling of Stress : Not at all  Social Connections: Moderately Isolated   Frequency of Communication with Friends and Family: More than three times a week   Frequency of Social Gatherings with Friends and Family: More than three times a week   Attends Religious Services: Never   Marine scientist or Organizations: Yes   Attends Music therapist: More than 4 times per year   Marital Status: Widowed  Human resources officer Violence: Not At Risk   Fear of Current or Ex-Partner: No   Emotionally Abused: No   Physically Abused: No   Sexually Abused: No    Outpatient Medications Prior to Visit  Medication Sig Dispense Refill   allopurinol (ZYLOPRIM) 100 MG tablet TAKE ONE-HALF (1/2) TABLET EVERY MONDAY, WEDNESDAY AND FRIDAY 30 tablet 3   apixaban (ELIQUIS) 2.5 MG TABS tablet TAKE 1 TABLET TWICE A DAY 180 tablet 1   aspirin 81 MG tablet Take 81 mg by mouth daily.     BD INSULIN SYRINGE ULTRAFINE 31G X 5/16" 0.3 ML MISC USE DAILY AS INSTRUCTED 100 each 2   cetirizine (ZYRTEC) 10 MG tablet Take 10 mg by mouth daily as needed for allergies.     Cholecalciferol (VITAMIN D) 1000 UNITS capsule Take 1,000 Units by mouth daily.      diclofenac Sodium (VOLTAREN)  1 % GEL Apply 2 g topically 4 (four) times daily. 50 g 0    doxycycline (VIBRA-TABS) 100 MG tablet Take 1 tablet (100 mg total) by mouth 2 (two) times daily for 7 days. 14 tablet 0   fish oil-omega-3 fatty acids 1000 MG capsule Take 1 g by mouth daily.     furosemide (LASIX) 80 MG tablet TAKE 1 TABLET TWICE A DAY (DOSAGE CHANGE) 180 tablet 3   glucose blood (FREESTYLE LITE) test strip USE TO TEST BLOOD SUGAR ONCE DAILY AND AS NEEDED FOR DIABETES MELLITUS 300 each 3   hydrALAZINE (APRESOLINE) 25 MG tablet Take 1 tablet (25 mg total) by mouth in the morning and at bedtime. 180 tablet 3   ipratropium (ATROVENT) 0.03 % nasal spray Place 2 sprays into both nostrils 2 (two) times daily as needed for rhinitis. 90 mL 3   JARDIANCE 10 MG TABS tablet TAKE 1 TABLET DAILY BEFORE BREAKFAST 90 tablet 3   LANTUS 100 UNIT/ML injection INJECT 20 TO 25 UNITS AT BEDTIME 40 mL 3   magnesium oxide (MAG-OX) 400 MG tablet Take 400 mg by mouth daily.     metoprolol tartrate (LOPRESSOR) 50 MG tablet TAKE 1 TABLET TWICE A DAY 90 tablet 1   Multiple Vitamin (MULTIVITAMIN WITH MINERALS) TABS Take 1 tablet by mouth daily.     nitroGLYCERIN (NITROSTAT) 0.4 MG SL tablet Place 1 tablet (0.4 mg total) under the tongue every 5 (five) minutes as needed for chest pain. 100 tablet 1   Potassium Gluconate 595 MG CAPS Take 595 mg by mouth daily.     ranolazine (RANEXA) 500 MG 12 hr tablet TAKE 1 TABLET TWICE A DAY (NEED OFFICE VISIT) 60 tablet 0   rosuvastatin (CRESTOR) 40 MG tablet TAKE 1 TABLET AT BEDTIME 90 tablet 3   traMADol (ULTRAM) 50 MG tablet Take 1 tablet (50 mg total) by mouth every 6 (six) hours as needed. 15 tablet 0   No facility-administered medications prior to visit.    No Known Allergies  Review of Systems  Constitutional:  Negative for chills and fever.  Respiratory:  Negative for shortness of breath.   Cardiovascular:  Positive for leg swelling. Negative for chest pain.  Gastrointestinal:  Negative for abdominal pain, diarrhea, nausea and vomiting.  Skin:  Positive  for color change and wound.  Hematological:  Bruises/bleeds easily.      Objective:    Physical Exam Vitals and nursing note reviewed.  Constitutional:      Appearance: Normal appearance.  Cardiovascular:     Rate and Rhythm: Normal rate and regular rhythm.     Pulses: Normal pulses.     Heart sounds: Normal heart sounds.  Pulmonary:     Effort: Pulmonary effort is normal.     Breath sounds: Normal breath sounds.  Abdominal:     General: Bowel sounds are normal.  Musculoskeletal:     Right lower leg: Edema present.  Skin:    General: Skin is warm.     Comments: Discoloration to bilateral lower legs that is base line  See clinical picture   Neurological:     Mental Status: He is alert.     BP (!) 136/50    Pulse 65    Temp 97.9 F (36.6 C)    Resp 16    Ht 5\' 7"  (1.702 m)    Wt 167 lb 8 oz (76 kg)    SpO2 97%    BMI 26.23 kg/m  Wt Readings from  Last 3 Encounters:  12/02/21 167 lb 8 oz (76 kg)  11/29/21 170 lb 2 oz (77.2 kg)  11/25/21 167 lb 8 oz (76 kg)    Health Maintenance Due  Topic Date Due   Zoster Vaccines- Shingrix (1 of 2) Never done   OPHTHALMOLOGY EXAM  04/14/2019   HEMOGLOBIN A1C  04/29/2021   FOOT EXAM  10/29/2021    There are no preventive care reminders to display for this patient.   Lab Results  Component Value Date   TSH 3.89 02/01/2019   Lab Results  Component Value Date   WBC 8.2 01/27/2021   HGB 13.9 01/27/2021   HCT 44.4 01/27/2021   MCV 99.1 01/27/2021   PLT 148 (L) 01/27/2021   Lab Results  Component Value Date   NA 136 01/27/2021   K 4.2 01/27/2021   CO2 31 01/27/2021   GLUCOSE 195 (H) 01/27/2021   BUN 29 (H) 01/27/2021   CREATININE 2.45 (H) 01/27/2021   BILITOT 0.8 10/29/2020   ALKPHOS 81 10/29/2020   AST 18 10/29/2020   ALT 15 10/29/2020   PROT 7.8 10/29/2020   ALBUMIN 4.0 10/29/2020   CALCIUM 9.7 01/27/2021   ANIONGAP 7 01/27/2021   GFR 19.79 (L) 10/29/2020   Lab Results  Component Value Date   CHOL 123  10/29/2020   Lab Results  Component Value Date   HDL 30.50 (L) 10/29/2020   Lab Results  Component Value Date   LDLCALC 62 10/29/2020   Lab Results  Component Value Date   TRIG 148.0 10/29/2020   Lab Results  Component Value Date   CHOLHDL 4 10/29/2020   Lab Results  Component Value Date   HGBA1C 9.3 (H) 10/29/2020       Assessment & Plan:   Problem List Items Addressed This Visit       Other   Wound infection    Patient responded well to IM injection of Rocephin last office visit is tolerating oral doxycycline well.  Patient states he has medications to last through Sunday.  Encourage patient to continue taking antibiotics as the wound is looking better.  Did try to get patient into wound center as it looks like there may be some eschar in the wound but could benefit from debridement.  [place] wound clinic is backed out for approximately 1 month we will see if we can get him into the [facility] wound center little sooner.  Patient will follow-up with me in 1 week during the meantime      Relevant Orders   CBC with Differential/Platelet (Completed)   Staphylococcus infection - Primary    Culture returned stating Staph aureus infection.  Sensitivity also resulted in patient's infection sensitive to tetracyclines.  Patient was placed on doxycycline last office visit.  Continue antibiotic medication as prescribed and follow-up as recommended.  Signs and symptoms reviewed when to seek urgent or emergent health care.  Patient is not having any systemic symptoms but given the size of the wound and patient's age I was going to draw blood cultures in office also alerted that we do not have the supplies for blood culture drawl and they were on backorder.      Follow up in 1 week with me  No orders of the defined types were placed in this encounter.  This visit occurred during the SARS-CoV-2 public health emergency.  Safety protocols were in place, including screening questions prior  to the visit, additional usage of staff PPE, and extensive cleaning of  exam room while observing appropriate contact time as indicated for disinfecting solutions.    Romilda Garret, NP

## 2021-12-07 ENCOUNTER — Encounter (HOSPITAL_BASED_OUTPATIENT_CLINIC_OR_DEPARTMENT_OTHER): Payer: Medicare Other | Attending: Internal Medicine | Admitting: Internal Medicine

## 2021-12-07 ENCOUNTER — Other Ambulatory Visit: Payer: Self-pay

## 2021-12-07 DIAGNOSIS — N189 Chronic kidney disease, unspecified: Secondary | ICD-10-CM | POA: Insufficient documentation

## 2021-12-07 DIAGNOSIS — E1122 Type 2 diabetes mellitus with diabetic chronic kidney disease: Secondary | ICD-10-CM | POA: Insufficient documentation

## 2021-12-07 DIAGNOSIS — Y838 Other surgical procedures as the cause of abnormal reaction of the patient, or of later complication, without mention of misadventure at the time of the procedure: Secondary | ICD-10-CM | POA: Insufficient documentation

## 2021-12-07 DIAGNOSIS — I130 Hypertensive heart and chronic kidney disease with heart failure and stage 1 through stage 4 chronic kidney disease, or unspecified chronic kidney disease: Secondary | ICD-10-CM | POA: Insufficient documentation

## 2021-12-07 DIAGNOSIS — Z7901 Long term (current) use of anticoagulants: Secondary | ICD-10-CM | POA: Diagnosis not present

## 2021-12-07 DIAGNOSIS — E1142 Type 2 diabetes mellitus with diabetic polyneuropathy: Secondary | ICD-10-CM | POA: Diagnosis not present

## 2021-12-07 DIAGNOSIS — Z794 Long term (current) use of insulin: Secondary | ICD-10-CM | POA: Diagnosis not present

## 2021-12-07 DIAGNOSIS — I5032 Chronic diastolic (congestive) heart failure: Secondary | ICD-10-CM | POA: Diagnosis not present

## 2021-12-07 DIAGNOSIS — I2510 Atherosclerotic heart disease of native coronary artery without angina pectoris: Secondary | ICD-10-CM | POA: Diagnosis not present

## 2021-12-07 DIAGNOSIS — L97812 Non-pressure chronic ulcer of other part of right lower leg with fat layer exposed: Secondary | ICD-10-CM | POA: Diagnosis not present

## 2021-12-07 DIAGNOSIS — I87321 Chronic venous hypertension (idiopathic) with inflammation of right lower extremity: Secondary | ICD-10-CM | POA: Insufficient documentation

## 2021-12-07 DIAGNOSIS — Z85828 Personal history of other malignant neoplasm of skin: Secondary | ICD-10-CM | POA: Diagnosis not present

## 2021-12-07 DIAGNOSIS — L97818 Non-pressure chronic ulcer of other part of right lower leg with other specified severity: Secondary | ICD-10-CM | POA: Diagnosis not present

## 2021-12-07 DIAGNOSIS — I872 Venous insufficiency (chronic) (peripheral): Secondary | ICD-10-CM | POA: Diagnosis not present

## 2021-12-07 DIAGNOSIS — I10 Essential (primary) hypertension: Secondary | ICD-10-CM | POA: Diagnosis not present

## 2021-12-07 DIAGNOSIS — T8130XA Disruption of wound, unspecified, initial encounter: Secondary | ICD-10-CM | POA: Insufficient documentation

## 2021-12-09 ENCOUNTER — Ambulatory Visit: Payer: Medicare Other | Admitting: Nurse Practitioner

## 2021-12-13 ENCOUNTER — Other Ambulatory Visit: Payer: Self-pay | Admitting: Physician Assistant

## 2021-12-13 ENCOUNTER — Telehealth: Payer: Self-pay | Admitting: Cardiovascular Disease

## 2021-12-13 ENCOUNTER — Ambulatory Visit: Payer: Medicare Other | Admitting: Physician Assistant

## 2021-12-13 MED ORDER — RANOLAZINE ER 500 MG PO TB12: ORAL_TABLET | ORAL | 0 refills | Status: DC

## 2021-12-13 NOTE — Telephone Encounter (Signed)
Eliquis 2.5 mg refill request received. Patient is 88 years old, weight- 76 kg, Crea- 2.45 on 01/27/21, Diagnosis- afib, and last seen by Dr. Kendrick Ranch on 01/27/21. Dose is appropriate based on dosing criteria. Will send in refill to requested pharmacy.

## 2021-12-13 NOTE — Telephone Encounter (Signed)
Refill sent to [facility].  

## 2021-12-13 NOTE — Telephone Encounter (Signed)
°*  STAT* If patient is at the pharmacy, call can be transferred to refill team.   1. Which medications need to be refilled? (please list name of each medication and dose if known)  ranolazine (RANEXA) 500 MG 12 hr tablet  2. Which pharmacy/location (including street and city if local pharmacy) is medication to be sent to? [facility] [place], [place]  3. Do they need a 30 day or 90 day supply?  Patient has a 1 week supply remaining. He is requesting enough medication to last him until his appointment on 2/28 with Dr. Gwenlyn Found.

## 2021-12-14 ENCOUNTER — Other Ambulatory Visit: Payer: Self-pay

## 2021-12-14 ENCOUNTER — Encounter (HOSPITAL_BASED_OUTPATIENT_CLINIC_OR_DEPARTMENT_OTHER): Payer: Medicare Other | Admitting: Internal Medicine

## 2021-12-14 DIAGNOSIS — L97812 Non-pressure chronic ulcer of other part of right lower leg with fat layer exposed: Secondary | ICD-10-CM | POA: Diagnosis not present

## 2021-12-14 DIAGNOSIS — E1142 Type 2 diabetes mellitus with diabetic polyneuropathy: Secondary | ICD-10-CM | POA: Diagnosis not present

## 2021-12-14 DIAGNOSIS — I87321 Chronic venous hypertension (idiopathic) with inflammation of right lower extremity: Secondary | ICD-10-CM | POA: Diagnosis not present

## 2021-12-14 DIAGNOSIS — I10 Essential (primary) hypertension: Secondary | ICD-10-CM | POA: Diagnosis not present

## 2021-12-15 ENCOUNTER — Other Ambulatory Visit: Payer: Self-pay | Admitting: Family Medicine

## 2021-12-15 NOTE — Progress Notes (Signed)
CALDWELL, KRONENBERGER ([id number]) Visit Report for 12/14/2021 Arrival Information Details Patient Name: Date of Service: [name], [name] 12/14/2021 10:30 A M Medical Record Number: [id number] Patient Account Number: [id number] Date of Birth/Sex: Treating RN: 1932-02-04 (88 y.o. Burnadette Pop, Lauren Primary Care Belladonna Lubinski: [name] Other Clinician: Referring Kely Dohn: Treating Illyana Schorsch/Extender: Earnest Conroy Weeks in Treatment: 1 Visit Information History Since Last Visit Added or deleted any medications: No Patient Arrived: Kasandra Knudsen Any new allergies or adverse reactions: No Arrival Time: 10:42 Had a fall or experienced change in No Accompanied By: family activities of daily living that may affect Transfer Assistance: Manual risk of falls: Patient Identification Verified: Yes Signs or symptoms of abuse/neglect since last visito No Secondary Verification Process Completed: Yes Hospitalized since last visit: No Patient Requires Transmission-Based Precautions: No Implantable device outside of the clinic excluding No Patient Has Alerts: Yes cellular tissue based products placed in the center Patient Alerts: Patient on Blood Thinner since last visit: Has Dressing in Place as Prescribed: Yes Has Compression in Place as Prescribed: Yes Pain Present Now: No Electronic Signature(s) Signed: 12/15/2021 4:37:24 PM By: Rhae Hammock RN Entered By: Rhae Hammock on 12/14/2021 10:42:45 -------------------------------------------------------------------------------- Compression Therapy Details Patient Name: Date of Service: Jeremiah Bang. 12/14/2021 10:30 A M Medical Record Number: [id number] Patient Account Number: [id number] Date of Birth/Sex: Treating RN: [birth year] (89 y.o. Burnadette Pop, Lauren Primary Care Nicosha Struve: [name] Other Clinician: Referring Shameca Landen: Treating Nirvaan Frett/Extender: Earnest Conroy Weeks in  Treatment: 1 Compression Therapy Performed for Wound Assessment: Wound #1 Right,Anterior Lower Leg Performed By: Clinician Rhae Hammock, RN Compression Type: Three Layer Post Procedure Diagnosis Same as Pre-procedure Electronic Signature(s) Signed: 12/15/2021 4:37:24 PM By: Rhae Hammock RN Entered By: Rhae Hammock on 12/14/2021 11:01:00 -------------------------------------------------------------------------------- Lower Extremity Assessment Details Patient Name: Date of Service: [name], [name] 12/14/2021 10:30 A M Medical Record Number: [id number] Patient Account Number: [id number] Date of Birth/Sex: Treating RN: June 08, 1932 (89 y.o. Burnadette Pop, Lauren Primary Care Tamya Denardo: [name] Other Clinician: Referring Alaura Schippers: Treating Nydia Ytuarte/Extender: Earnest Conroy Weeks in Treatment: 1 Edema Assessment Assessed: Shirlyn Goltz: No] Patrice Paradise: Yes] E[Left: dema] [Right: :] Calf Left: Right: Point of Measurement: 33 cm From Medial Instep 29 cm Ankle Left: Right: Point of Measurement: 11 cm From Medial Instep 22 cm Vascular Assessment Pulses: Dorsalis Pedis Palpable: [Right:Yes] Posterior Tibial Palpable: [Right:Yes] Electronic Signature(s) Signed: 12/15/2021 4:37:24 PM By: Rhae Hammock RN Entered By: Rhae Hammock on 12/14/2021 10:48:14 -------------------------------------------------------------------------------- Multi Wound Chart Details Patient Name: Date of Service: Jeremiah Bang. 12/14/2021 10:30 A M Medical Record Number: [id number] Patient Account Number: [id number] Date of Birth/Sex: Treating RN: [birth year] (89 y.o. [name], [name] Primary Care Rexanna Louthan: [name] Other Clinician: Referring Ra Pfiester: Treating Jarome Trull/Extender: Earnest Conroy Weeks in Treatment: 1 Vital Signs Height(in): 68 Capillary Blood Glucose(mg/dl): 178 Weight(lbs): 161 Pulse(bpm): 73 Body Mass  Index(BMI): 24.5 Blood Pressure(mmHg): 115/67 Temperature(F): 98.5 Respiratory Rate(breaths/min): 17 Photos: [1:Right, Anterior Lower Leg] [N/A:N/A N/A] Wound Location: [1:Trauma] [N/A:N/A] Wounding Event: [1:Venous Leg Ulcer] [N/A:N/A] Primary Etiology: [1:Anemia, Arrhythmia, Congestive Heart N/A] Comorbid History: [1:Failure, Coronary Artery Disease, Hypertension, Myocardial Infarction, Type II Diabetes, Gout, Neuropathy 11/18/2021] [N/A:N/A] Date Acquired: [1:1] [N/A:N/A] Weeks of Treatment: [1:Open] [N/A:N/A] Wound Status: [1:No] [N/A:N/A] Wound Recurrence: [1:6.5x1.1x0.2] [N/A:N/A] Measurements L x W x D (cm) [1:5.616] [N/A:N/A] A (cm) : rea [1:1.123] [N/A:N/A] Volume (cm) : [1:69.40%] [N/A:N/A] % Reduction in Area: [1:38.90%] [N/A:N/A] % Reduction in Volume: [1:Full Thickness Without Exposed] [  N/A:N/A] Classification: [1:Support Structures Medium] [N/A:N/A] Exudate A mount: [1:Serosanguineous] [N/A:N/A] Exudate Type: [1:red, brown] [N/A:N/A] Exudate Color: [1:Flat and Intact] [N/A:N/A] Wound Margin: [1:Large (67-100%)] [N/A:N/A] Granulation Amount: [1:Red] [N/A:N/A] Granulation Quality: [1:Small (1-33%)] [N/A:N/A] Necrotic Amount: [1:Eschar, Adherent Slough] [N/A:N/A] Necrotic Tissue: [1:Fat Layer (Subcutaneous Tissue): Yes N/A] Exposed Structures: [1:Fascia: No Tendon: No Muscle: No Joint: No Bone: No None] [N/A:N/A] Epithelialization: [1:Compression Therapy] [N/A:N/A] Treatment Notes Electronic Signature(s) Signed: 12/14/2021 4:28:46 PM By: [name] MD Signed: 12/14/2021 5:37:15 PM By: [name], [name] Entered By: [name] on 12/14/2021 11:59:16 -------------------------------------------------------------------------------- Multi-Disciplinary Care Plan Details Patient Name: Date of Service: Jeremiah Bang. 12/14/2021 10:30 A M Medical Record Number: [id number] Patient Account Number: [id number] Date of Birth/Sex: Treating RN: [birth year] (88 y.o.  Burnadette Pop, [place] Primary Care Sundeep Destin: [name] Other Clinician: Referring Efosa Treichler: Treating Neri Samek/Extender: Veneta Penton in Treatment: 1 Multidisciplinary Care Plan reviewed with physician Active Inactive Abuse / Safety / Falls / Self Care Management Nursing Diagnoses: History of Falls Potential for injury related to falls Goals: Patient will not experience any injury related to falls Date Initiated: 12/07/2021 Target Resolution Date: 01/07/2022 Goal Status: Active Patient/caregiver will verbalize/demonstrate measures taken to prevent injury and/or falls Date Initiated: 12/07/2021 Target Resolution Date: 01/07/2022 Goal Status: Active Interventions: Assess Activities of Daily Living upon admission and as needed Assess fall risk on admission and as needed Assess: immobility, friction, shearing, incontinence upon admission and as needed Assess impairment of mobility on admission and as needed per policy Assess personal safety and home safety (as indicated) on admission and as needed Assess self care needs on admission and as needed Provide education on fall prevention Provide education on personal and home safety Notes: Nutrition Nursing Diagnoses: Impaired glucose control: actual or potential Potential for alteratiion in Nutrition/Potential for imbalanced nutrition Goals: Patient/caregiver agrees to and verbalizes understanding of need to use nutritional supplements and/or vitamins as prescribed Date Initiated: 12/07/2021 Target Resolution Date: 01/07/2022 Goal Status: Active Patient/caregiver will maintain therapeutic glucose control Date Initiated: 12/07/2021 Target Resolution Date: 01/07/2022 Goal Status: Active Interventions: Assess HgA1c results as ordered upon admission and as needed Assess patient nutrition upon admission and as needed per policy Provide education on elevated blood sugars and impact on wound  healing Provide education on nutrition Treatment Activities: Education provided on Nutrition : 12/07/2021 Notes: Wound/Skin Impairment Nursing Diagnoses: Impaired tissue integrity Knowledge deficit related to ulceration/compromised skin integrity Goals: Patient/caregiver will verbalize understanding of skin care regimen Date Initiated: 12/07/2021 Target Resolution Date: 01/07/2022 Goal Status: Active Ulcer/skin breakdown will have a volume reduction of 30% by week 4 Date Initiated: 12/07/2021 Target Resolution Date: 01/07/2022 Goal Status: Active Interventions: Assess patient/caregiver ability to obtain necessary supplies Assess patient/caregiver ability to perform ulcer/skin care regimen upon admission and as needed Assess ulceration(s) every visit Provide education on ulcer and skin care Notes: Electronic Signature(s) Signed: 12/15/2021 4:37:24 PM By: Rhae Hammock RN Entered By: Rhae Hammock on 12/14/2021 10:55:27 -------------------------------------------------------------------------------- Pain Assessment Details Patient Name: Date of Service: Jeremiah Bang. 12/14/2021 10:30 A M Medical Record Number: [id number] Patient Account Number: [id number] Date of Birth/Sex: Treating RN: 03-Mar-1932 (88 y.o. Burnadette Pop, Lauren Primary Care Norvin Ohlin: [name] Other Clinician: Referring Chancie Lampert: Treating Koden Hunzeker/Extender: Earnest Conroy Weeks in Treatment: 1 Active Problems Location of Pain Severity and Description of Pain Patient Has Paino No Site Locations Pain Management and Medication Current Pain Management: Electronic Signature(s) Signed: 12/15/2021 4:37:24 PM By: Rhae Hammock RN Entered By: Hollie Salk  Lauren on 12/14/2021 10:43:46 -------------------------------------------------------------------------------- Patient/Caregiver Education Details Patient Name: Date of Service: [name], [name] 1/31/2023andnbsp10:30 [place] Record Number: [id number] Patient Account Number: [id number] Date of Birth/Gender: Treating RN: Jan 25, 1932 (88 y.o. Burnadette Pop, [place] Primary Care Physician: [name] Other Clinician: Referring Physician: Treating Physician/Extender: Veneta Penton in Treatment: 1 Education Assessment Education Provided To: Patient Education Topics Provided Elevated Blood Sugar/ Impact on Healing: Methods: Explain/Verbal Responses: State content correctly Wound/Skin Impairment: Methods: Explain/Verbal Responses: State content correctly Electronic Signature(s) Signed: 12/15/2021 4:37:24 PM By: Rhae Hammock RN Signed: 12/15/2021 4:37:24 PM By: Rhae Hammock RN Entered By: Rhae Hammock on 12/14/2021 10:55:52 -------------------------------------------------------------------------------- Wound Assessment Details Patient Name: Date of Service: Jeremiah Bang. 12/14/2021 10:30 A M Medical Record Number: [id number] Patient Account Number: [id number] Date of Birth/Sex: Treating RN: [birth year] (88 y.o. Burnadette Pop, Lauren Primary Care Ashanty Coltrane: [name] Other Clinician: Referring Sergei Delo: Treating Afnan Emberton/Extender: Earnest Conroy Weeks in Treatment: 1 Wound Status Wound Number: 1 Primary Venous Leg Ulcer Etiology: Wound Location: Right, Anterior Lower Leg Wound Open Wounding Event: Trauma Status: Date Acquired: 11/18/2021 Comorbid Anemia, Arrhythmia, Congestive Heart Failure, Coronary Artery Weeks Of Treatment: 1 History: Disease, Hypertension, Myocardial Infarction, Type II Diabetes, Clustered Wound: No Gout, Neuropathy Photos Wound Measurements Length: (cm) 6.5 Width: (cm) 1.1 Depth: (cm) 0.2 Area: (cm) 5.616 Volume: (cm) 1.123 % Reduction in Area: 69.4% % Reduction in Volume: 38.9% Epithelialization: None Tunneling: No Undermining: No Wound Description Classification: Full Thickness  Without Exposed Support Structures Wound Margin: Flat and Intact Exudate Amount: Medium Exudate Type: Serosanguineous Exudate Color: red, brown Foul Odor After Cleansing: No Slough/Fibrino Yes Wound Bed Granulation Amount: Large (67-100%) Exposed Structure Granulation Quality: Red Fascia Exposed: No Necrotic Amount: Small (1-33%) Fat Layer (Subcutaneous Tissue) Exposed: Yes Necrotic Quality: Eschar, Adherent Slough Tendon Exposed: No Muscle Exposed: No Joint Exposed: No Bone Exposed: No Electronic Signature(s) Signed: 12/15/2021 4:37:24 PM By: Rhae Hammock RN Entered By: Rhae Hammock on 12/14/2021 10:52:49 -------------------------------------------------------------------------------- Vitals Details Patient Name: Date of Service: Jeremiah Bang. 12/14/2021 10:30 A M Medical Record Number: [id number] Patient Account Number: [id number] Date of Birth/Sex: Treating RN: 08-01-1932 (88 y.o. Burnadette Pop, Lauren Primary Care Terre Hanneman: [name] Other Clinician: Referring Bonnie Overdorf: Treating Ledon Weihe/Extender: Earnest Conroy Weeks in Treatment: 1 Vital Signs Time Taken: 10:42 Temperature (F): 98.5 Height (in): 68 Pulse (bpm): 68 Weight (lbs): 161 Respiratory Rate (breaths/min): 17 Body Mass Index (BMI): 24.5 Blood Pressure (mmHg): 115/67 Capillary Blood Glucose (mg/dl): 178 Reference Range: 80 - 120 mg / dl Electronic Signature(s) Signed: 12/15/2021 4:37:24 PM By: Rhae Hammock RN Entered By: Rhae Hammock on 12/14/2021 10:43:23

## 2021-12-15 NOTE — Progress Notes (Signed)
TAMERON, LAMA ([id number]) Visit Report for 12/14/2021 HPI Details Patient Name: Date of Service: [name], [name] 12/14/2021 10:30 A M Medical Record Number: [id number] Patient Account Number: [id number] Date of Birth/Sex: Treating RN: 10-May-1932 (88 y.o. [name] Primary Care Provider: [name] Other Clinician: Referring Provider: Treating Provider/Extender: [name], [name] Weeks in Treatment: 1 History of Present Illness HPI Description: ADMISSION 12/07/2021 This is a pleasant 88 year old man who arrives in clinic accompanied by his daughter for review of a traumatic wound on the right leg that happened on 11/18/2021. This was a result of a fall he had at home. He was seen in the ER on the same day and had 7 sutures placed. Ultimately the sutures were removed by the family physician on 1/16 however the wound dehisced. They stated that it was hard to get the original dressing off that he had not changed as well. It was felt to be infected. He was given Rocephin and subsequently doxycycline which he finished yesterday. A culture at that time showed methicillin sensitive staph aureus. He comes in the clinic today with eschar over the entire wound surface. There is erythema around the wound but he says he had some of that even before this happened. He quite clearly has chronic venous insufficiency bilaterally. Past medical history includes basal cell CA of the skin, hypertension, chronic kidney disease, heart failure with preserved ejection fraction, coronary artery disease, carotid stenosis, atrial fib on Eliquis, history of vein stripping in the right leg, type 2 diabetes on insulin and mitral valve regurgitation ABI in our clinic was 0.92 on the right 1/31; wound looks better this week smaller still requiring debridement. We will use silver alginate under compression Electronic Signature(s) Signed: 12/14/2021 4:28:46 PM By: [name] MD Entered  By: [name] on 12/14/2021 11:59:42 -------------------------------------------------------------------------------- Physical Exam Details Patient Name: Date of Service: Jeremiah Bang. 12/14/2021 10:30 A M Medical Record Number: [id number] Patient Account Number: [id number] Date of Birth/Sex: Treating RN: 1932/09/02 (88 y.o. [name] Primary Care Provider: [name] Other Clinician: Referring Provider: Treating Provider/Extender: Earnest Conroy Weeks in Treatment: 1 Constitutional Sitting or standing Blood Pressure is within target range for patient.. Pulse regular and within target range for patient.[name] Respirations regular, non-labored and within target range.. Temperature is normal and within the target range for the patient.[name] Appears in no distress. Notes Wound exam; right anterior lower tibia. Still requiring debridement with a #5 curette removing eschar dry skin from the circumference also very fibrinous adherent debris from the wound surface hemostasis with direct pressure Electronic Signature(s) Signed: 12/14/2021 4:28:46 PM By: [name] MD Entered By: [name] on 12/14/2021 12:00:23 -------------------------------------------------------------------------------- Physician Orders Details Patient Name: Date of Service: Jeremiah Bang. 12/14/2021 10:30 A M Medical Record Number: [id number] Patient Account Number: [id number] Date of Birth/Sex: Treating RN: 1932/06/17 (88 y.o. Burnadette Pop, Lauren Primary Care Provider: [name] Other Clinician: Referring Provider: Treating Provider/Extender: Earnest Conroy Weeks in Treatment: 1 Verbal / Phone Orders: No Diagnosis Coding Follow-up Appointments ppointment in 1 week. - Dr. Dellia Nims Return A Bathing/ Shower/ Hygiene May shower with protection but do not get wound dressing(s) wet. - Ok to use Forensic scientist, can purchase at [facility], or [employer] Edema Control - Lymphedema / SCD / Other Elevate legs to the level of the heart or above for 30 minutes daily and/or when sitting, a frequency of: - throughout the day Avoid standing for  long periods of time. Exercise regularly Moisturize legs daily. - left leg Wound Treatment Wound #1 - Lower Leg Wound Laterality: Right, Anterior Cleanser: Soap and Water 1 x Per Week Discharge Instructions: May shower and wash wound with dial antibacterial soap and water prior to dressing change. Cleanser: Wound Cleanser 1 x Per Week Discharge Instructions: Cleanse the wound with wound cleanser prior to applying a clean dressing using gauze sponges, not tissue or cotton balls. Peri-Wound Care: Triamcinolone 15 (g) 1 x Per Week Discharge Instructions: Use triamcinolone 15 (g) as directed Peri-Wound Care: Sween Lotion (Moisturizing lotion) 1 x Per Week Discharge Instructions: Apply moisturizing lotion as directed Prim Dressing: KerraCel Ag Gelling Fiber Dressing, 4x5 in (silver alginate) 1 x Per Week ary Discharge Instructions: Apply silver alginate to wound bed as instructed Secondary Dressing: Woven Gauze Sponge, Non-Sterile 4x4 in 1 x Per Week Discharge Instructions: Apply over primary dressing as directed. Secondary Dressing: ABD Pad, 8x10 1 x Per Week Discharge Instructions: Apply over primary dressing as directed. Compression Wrap: ThreePress (3 layer compression wrap) 1 x Per Week Discharge Instructions: Apply three layer compression as directed. Electronic Signature(s) Signed: 12/14/2021 4:28:46 PM By: [name] MD Signed: 12/15/2021 4:37:24 PM By: Rhae Hammock RN Entered By: Rhae Hammock on 12/14/2021 10:56:23 -------------------------------------------------------------------------------- Problem List Details Patient Name: Date of Service: Jeremiah Bang. 12/14/2021 10:30 A M Medical Record Number: [id number] Patient Account Number: [id number] Date of  Birth/Sex: Treating RN: September 12, 1932 (88 y.o. [name] Primary Care Provider: [name] Other Clinician: Referring Provider: Treating Provider/Extender: [name], [name] Weeks in Treatment: 1 Active Problems ICD-10 Encounter Code Description Active Date MDM Diagnosis S81.811D Laceration without foreign body, right lower leg, subsequent encounter 12/07/2021 No Yes L97.818 Non-pressure chronic ulcer of other part of right lower leg with other specified 12/07/2021 No Yes severity I87.321 Chronic venous hypertension (idiopathic) with inflammation of right lower 12/07/2021 No Yes extremity E11.42 Type 2 diabetes mellitus with diabetic polyneuropathy 12/07/2021 No Yes Inactive Problems Resolved Problems Electronic Signature(s) Signed: 12/14/2021 4:28:46 PM By: [name] MD Entered By: [name] on 12/14/2021 11:58:56 -------------------------------------------------------------------------------- Progress Note Details Patient Name: Date of Service: Jeremiah Bang. 12/14/2021 10:30 A M Medical Record Number: [id number] Patient Account Number: [id number] Date of Birth/Sex: Treating RN: 08-Mar-1932 (88 y.o. [name] Primary Care Provider: [name] Other Clinician: Referring Provider: Treating Provider/Extender: [name], [name] Weeks in Treatment: 1 Subjective History of Present Illness (HPI) ADMISSION 12/07/2021 This is a pleasant 88 year old man who arrives in clinic accompanied by his daughter for review of a traumatic wound on the right leg that happened on 11/18/2021. This was a result of a fall he had at home. He was seen in the ER on the same day and had 7 sutures placed. Ultimately the sutures were removed by the family physician on 1/16 however the wound dehisced. They stated that it was hard to get the original dressing off that he had not changed as well. It was felt to be infected. He was given  Rocephin and subsequently doxycycline which he finished yesterday. A culture at that time showed methicillin sensitive staph aureus. He comes in the clinic today with eschar over the entire wound surface. There is erythema around the wound but he says he had some of that even before this happened. He quite clearly has chronic venous insufficiency bilaterally. Past medical history includes basal cell CA of the skin, hypertension, chronic kidney disease, heart failure with preserved ejection  fraction, coronary artery disease, carotid stenosis, atrial fib on Eliquis, history of vein stripping in the right leg, type 2 diabetes on insulin and mitral valve regurgitation ABI in our clinic was 0.92 on the right 1/31; wound looks better this week smaller still requiring debridement. We will use silver alginate under compression Objective Constitutional Sitting or standing Blood Pressure is within target range for patient.. Pulse regular and within target range for patient.[name] Respirations regular, non-labored and within target range.. Temperature is normal and within the target range for the patient.[name] Appears in no distress. Vitals Time Taken: 10:42 AM, Height: 68 in, Weight: 161 lbs, BMI: 24.5, Temperature: 98.5 F, Pulse: 68 bpm, Respiratory Rate: 17 breaths/min, Blood Pressure: 115/67 mmHg, Capillary Blood Glucose: 178 mg/dl. General Notes: Wound exam; right anterior lower tibia. Still requiring debridement with a #5 curette removing eschar dry skin from the circumference also very fibrinous adherent debris from the wound surface hemostasis with direct pressure Integumentary (Hair, Skin) Wound #1 status is Open. Original cause of wound was Trauma. The date acquired was: 11/18/2021. The wound has been in treatment 1 weeks. The wound is located on the Right,Anterior Lower Leg. The wound measures 6.5cm length x 1.1cm width x 0.2cm depth; 5.616cm^2 area and 1.123cm^3 volume. There is Fat Layer (Subcutaneous  Tissue) exposed. There is no tunneling or undermining noted. There is a medium amount of serosanguineous drainage noted. The wound margin is flat and intact. There is large (67-100%) red granulation within the wound bed. There is a small (1-33%) amount of necrotic tissue within the wound bed including Eschar and Adherent Slough. Assessment Active Problems ICD-10 Laceration without foreign body, right lower leg, subsequent encounter Non-pressure chronic ulcer of other part of right lower leg with other specified severity Chronic venous hypertension (idiopathic) with inflammation of right lower extremity Type 2 diabetes mellitus with diabetic polyneuropathy Procedures Wound #1 Pre-procedure diagnosis of Wound #1 is a Venous Leg Ulcer located on the Right,Anterior Lower Leg . There was a Three Layer Compression Therapy Procedure by Rhae Hammock, RN. Post procedure Diagnosis Wound #1: Same as Pre-Procedure Plan Follow-up Appointments: Return Appointment in 1 week. - Dr. Dellia Nims Bathing/ Shower/ Hygiene: May shower with protection but do not get wound dressing(s) wet. - Ok to use Forensic scientist, can purchase at [facility], [facility], or [employer] Edema Control - Lymphedema / SCD / Other: Elevate legs to the level of the heart or above for 30 minutes daily and/or when sitting, a frequency of: - throughout the day Avoid standing for long periods of time. Exercise regularly Moisturize legs daily. - left leg WOUND #1: - Lower Leg Wound Laterality: Right, Anterior Cleanser: Soap and Water 1 x Per Week/ Discharge Instructions: May shower and wash wound with dial antibacterial soap and water prior to dressing change. Cleanser: Wound Cleanser 1 x Per Week/ Discharge Instructions: Cleanse the wound with wound cleanser prior to applying a clean dressing using gauze sponges, not tissue or cotton balls. Peri-Wound Care: Triamcinolone 15 (g) 1 x Per Week/ Discharge Instructions: Use triamcinolone 15 (g) as  directed Peri-Wound Care: Sween Lotion (Moisturizing lotion) 1 x Per Week/ Discharge Instructions: Apply moisturizing lotion as directed Prim Dressing: KerraCel Ag Gelling Fiber Dressing, 4x5 in (silver alginate) 1 x Per Week/ ary Discharge Instructions: Apply silver alginate to wound bed as instructed Secondary Dressing: Woven Gauze Sponge, Non-Sterile 4x4 in 1 x Per Week/ Discharge Instructions: Apply over primary dressing as directed. Secondary Dressing: ABD Pad, 8x10 1 x Per Week/ Discharge Instructions: Apply over  primary dressing as directed. Com pression Wrap: ThreePress (3 layer compression wrap) 1 x Per Week/ Discharge Instructions: Apply three layer compression as directed. 1. Still using silver alginate, TCA under ABDs and 3 layer compression. 2. He appears to be doing well Electronic Signature(s) Signed: 12/14/2021 4:28:46 PM By: [name] MD Entered By: [name] on 12/14/2021 12:01:15 -------------------------------------------------------------------------------- SuperBill Details Patient Name: Date of Service: Jeremiah Bang 12/14/2021 Medical Record Number: [id number] Patient Account Number: [id number] Date of Birth/Sex: Treating RN: 27-May-1932 (88 y.o. [name], [name] Primary Care Provider: [name] Other Clinician: Referring Provider: Treating Provider/Extender: [name], [name] Weeks in Treatment: 1 Diagnosis Coding ICD-10 Codes Code Description [phone] Laceration without foreign body, right lower leg, subsequent encounter L97.818 Non-pressure chronic ulcer of other part of right lower leg with other specified severity I87.321 Chronic venous hypertension (idiopathic) with inflammation of right lower extremity E11.42 Type 2 diabetes mellitus with diabetic polyneuropathy Physician Procedures : CPT4 Code Description Modifier [id number] - WC PHYS LEVEL 3 - EST PT ICD-10 Diagnosis Description S81.811D Laceration  without foreign body, right lower leg, subsequent encounter L97.818 Non-pressure chronic ulcer of other part of right lower leg  with other specified severity I87.321 Chronic venous hypertension (idiopathic) with inflammation of right lower extremity E11.42 Type 2 diabetes mellitus with diabetic polyneuropathy Quantity: 1 Electronic Signature(s) Signed: 12/14/2021 4:28:46 PM By: [name] MD Entered By: [name] on 12/14/2021 12:01:35

## 2021-12-21 ENCOUNTER — Encounter (HOSPITAL_BASED_OUTPATIENT_CLINIC_OR_DEPARTMENT_OTHER): Payer: Medicare Other | Attending: Internal Medicine | Admitting: Internal Medicine

## 2021-12-21 ENCOUNTER — Other Ambulatory Visit: Payer: Self-pay

## 2021-12-21 DIAGNOSIS — T8133XA Disruption of traumatic injury wound repair, initial encounter: Secondary | ICD-10-CM | POA: Diagnosis not present

## 2021-12-21 DIAGNOSIS — E1122 Type 2 diabetes mellitus with diabetic chronic kidney disease: Secondary | ICD-10-CM | POA: Insufficient documentation

## 2021-12-21 DIAGNOSIS — E1142 Type 2 diabetes mellitus with diabetic polyneuropathy: Secondary | ICD-10-CM | POA: Diagnosis not present

## 2021-12-21 DIAGNOSIS — Z85828 Personal history of other malignant neoplasm of skin: Secondary | ICD-10-CM | POA: Diagnosis not present

## 2021-12-21 DIAGNOSIS — I4891 Unspecified atrial fibrillation: Secondary | ICD-10-CM | POA: Insufficient documentation

## 2021-12-21 DIAGNOSIS — I87331 Chronic venous hypertension (idiopathic) with ulcer and inflammation of right lower extremity: Secondary | ICD-10-CM | POA: Insufficient documentation

## 2021-12-21 DIAGNOSIS — Z7901 Long term (current) use of anticoagulants: Secondary | ICD-10-CM | POA: Diagnosis not present

## 2021-12-21 DIAGNOSIS — Y838 Other surgical procedures as the cause of abnormal reaction of the patient, or of later complication, without mention of misadventure at the time of the procedure: Secondary | ICD-10-CM | POA: Diagnosis not present

## 2021-12-21 DIAGNOSIS — I5030 Unspecified diastolic (congestive) heart failure: Secondary | ICD-10-CM | POA: Insufficient documentation

## 2021-12-21 DIAGNOSIS — L97812 Non-pressure chronic ulcer of other part of right lower leg with fat layer exposed: Secondary | ICD-10-CM | POA: Insufficient documentation

## 2021-12-21 DIAGNOSIS — N189 Chronic kidney disease, unspecified: Secondary | ICD-10-CM | POA: Insufficient documentation

## 2021-12-21 DIAGNOSIS — I130 Hypertensive heart and chronic kidney disease with heart failure and stage 1 through stage 4 chronic kidney disease, or unspecified chronic kidney disease: Secondary | ICD-10-CM | POA: Diagnosis not present

## 2021-12-21 NOTE — Progress Notes (Signed)
[name], [name] ([id number]) Visit Report for 12/21/2021 HPI Details Patient Name: Date of Service: [name], [name] 12/21/2021 1:30 PM Medical Record Number: [id number] Patient Account Number: [id number] Date of Birth/Sex: Treating RN: [birth year] (88 y.o. Ernestene Mention Primary Care Provider: [name] Other Clinician: Referring Provider: Treating Provider/Extender: [name], [name] Weeks in Treatment: 2 History of Present Illness HPI Description: ADMISSION 12/07/2021 This is a pleasant 88 year old man who arrives in clinic accompanied by his daughter for review of a traumatic wound on the right leg that happened on 11/18/2021. This was a result of a fall he had at home. He was seen in the ER on the same day and had 7 sutures placed. Ultimately the sutures were removed by the family physician on 1/16 however the wound dehisced. They stated that it was hard to get the original dressing off that he had not changed as well. It was felt to be infected. He was given Rocephin and subsequently doxycycline which he finished yesterday. A culture at that time showed methicillin sensitive staph aureus. He comes in the clinic today with eschar over the entire wound surface. There is erythema around the wound but he says he had some of that even before this happened. He quite clearly has chronic venous insufficiency bilaterally. Past medical history includes basal cell CA of the skin, hypertension, chronic kidney disease, heart failure with preserved ejection fraction, coronary artery disease, carotid stenosis, atrial fib on Eliquis, history of vein stripping in the right leg, type 2 diabetes on insulin and mitral valve regurgitation ABI in our clinic was 0.92 on the right 1/31; wound looks better this week smaller still requiring debridement. We will use silver alginate under compression 2/7; wound looks better no debridement is required I changed him to [facility] today  still under compression Electronic Signature(s) Signed: 12/21/2021 5:02:53 PM By: [name] MD Entered By: [name] on 12/21/2021 14:07:18 -------------------------------------------------------------------------------- Physical Exam Details Patient Name: Date of Service: Jeremiah Bang. 12/21/2021 1:30 PM Medical Record Number: [id number] Patient Account Number: [id number] Date of Birth/Sex: Treating RN: 07-23-1932 (88 y.o. Ernestene Mention Primary Care Provider: [name] Other Clinician: Referring Provider: Treating Provider/Extender: Earnest Conroy Weeks in Treatment: 2 Constitutional Sitting or standing Blood Pressure is within target range for patient.. Pulse regular and within target range for patient.[name] Respirations regular, non-labored and within target range.. Temperature is normal and within the target range for the patient.[name] Appears in no distress. Notes Wound exam; right anterior lower tibia no debridement is required. Granulation looks reasonably healthy. Dimensions are slightly better. 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 12/21/2021 1:30 PM Medical Record Number: [id number] Patient Account Number: [id number] Date of Birth/Sex: Treating RN: 1932-11-14 (88 y.o. Ulyses Amor, Vaughan Basta Primary Care Provider: [name] Other Clinician: Referring Provider: Treating Provider/Extender: Earnest Conroy Weeks in Treatment: 2 Verbal / Phone Orders: No Diagnosis Coding ICD-10 Coding Code Description [phone] Laceration without foreign body, right lower leg, subsequent encounter L97.818 Non-pressure chronic ulcer  of other part of right lower leg with other specified severity I87.321 Chronic venous hypertension (idiopathic) with inflammation of right lower extremity E11.42 Type 2 diabetes mellitus with diabetic polyneuropathy Follow-up Appointments ppointment in 1 week. - Dr. Dellia Nims Return A Bathing/ Shower/ Hygiene May shower  with protection but do not get wound dressing(s) wet. - Ok to use Market researcher, can purchase at [facility], [facility], or [employer] Edema Control - Lymphedema / SCD / Other Elevate legs to the level of the heart or above for 30 minutes daily and/or when sitting, a frequency of: - throughout the day Avoid standing for long periods of time. Exercise regularly Moisturize legs daily. - left leg Wound Treatment Wound #1 - Lower Leg Wound Laterality: Right, Anterior Cleanser: Soap and Water 1 x Per Week Discharge Instructions: May shower and wash wound with dial antibacterial soap and water prior to dressing change. Cleanser: Wound Cleanser 1 x Per Week Discharge Instructions: Cleanse the wound with wound cleanser prior to applying a clean dressing using gauze sponges, not tissue or cotton balls. Peri-Wound Care: Triamcinolone 15 (g) 1 x Per Week Discharge Instructions: Use triamcinolone 15 (g) as directed Peri-Wound Care: Sween Lotion (Moisturizing lotion) 1 x Per Week Discharge Instructions: Apply moisturizing lotion as directed Prim Dressing: Hydrofera Blue Classic Foam, 2x2 in 1 x Per Week ary Discharge Instructions: Moisten with saline prior to applying to wound bed Secondary Dressing: Woven Gauze Sponge, Non-Sterile 4x4 in 1 x Per Week Discharge Instructions: Apply over primary dressing as directed. Secondary Dressing: ABD Pad, 8x10 1 x Per Week Discharge Instructions: Apply over primary dressing as directed. Compression Wrap: ThreePress (3 layer compression wrap) 1 x Per Week Discharge Instructions: Apply three layer compression as directed. Electronic Signature(s) Signed:  12/21/2021 5:02:53 PM By: [name] MD Signed: 12/21/2021 6:03:00 PM By: Baruch Gouty RN, [name] Entered By: Baruch Gouty on 12/21/2021 14:07:55 -------------------------------------------------------------------------------- Problem List Details Patient Name: Date of Service: [name], [name] 12/21/2021 1:30 PM Medical Record Number: [id number] Patient Account Number: [id number] Date of Birth/Sex: Treating RN: [birth year] (88 y.o. Ernestene Mention Primary Care Provider: [name] Other Clinician: Referring Provider: Treating Provider/Extender: [name], [name] Weeks in Treatment: 2 Active Problems ICD-10 Encounter Code Description Active Date MDM Diagnosis S81.811D Laceration without foreign body, right lower leg, subsequent encounter 12/07/2021 No Yes L97.818 Non-pressure chronic ulcer of other part of right lower leg with other specified 12/07/2021 No Yes severity I87.321 Chronic venous hypertension (idiopathic) with inflammation of right lower 12/07/2021 No Yes extremity E11.42 Type 2 diabetes mellitus with diabetic polyneuropathy 12/07/2021 No Yes Inactive Problems Resolved Problems Electronic Signature(s) Signed: 12/21/2021 5:02:53 PM By: [name] MD Entered By: [name] on 12/21/2021 14:06:39 -------------------------------------------------------------------------------- Progress Note Details Patient Name: Date of Service: Jeremiah Bang. 12/21/2021 1:30 PM Medical Record Number: [id number] Patient Account Number: [id number] Date of Birth/Sex: Treating RN: [birth year] (88 y.o. Ernestene Mention Primary Care Provider: [name] Other Clinician: Referring Provider: Treating Provider/Extender: [name], [name] Weeks in Treatment: 2 Subjective History of Present Illness (HPI) ADMISSION 12/07/2021 This is a pleasant 88 year old man who arrives in clinic accompanied by his daughter for review of a  traumatic wound on the right leg that happened on 11/18/2021. This was a result of a fall he had at home. He was seen in the ER on the same day and had 7 sutures placed. Ultimately the sutures were removed by the family physician on 1/16 however the wound dehisced. They stated that it was hard to get the original dressing off that he had not changed as well. It was felt to be infected. He was given Rocephin and subsequently doxycycline which he finished yesterday. A culture at that time showed methicillin sensitive staph aureus. He comes in the  clinic today with eschar over the entire wound surface. There is erythema around the wound but he says he had some of that even before this happened. He quite clearly has chronic venous insufficiency bilaterally. Past medical history includes basal cell CA of the skin, hypertension, chronic kidney disease, heart failure with preserved ejection fraction, coronary artery disease, carotid stenosis, atrial fib on Eliquis, history of vein stripping in the right leg, type 2 diabetes on insulin and mitral valve regurgitation ABI in our clinic was 0.92 on the right 1/31; wound looks better this week smaller still requiring debridement. We will use silver alginate under compression 2/7; wound looks better no debridement is required I changed him to [facility] today still under compression Objective Constitutional Sitting or standing Blood Pressure is within target range for patient.. Pulse regular and within target range for patient.[name] Respirations regular, non-labored and within target range.. Temperature is normal and within the target range for the patient.[name] Appears in no distress. Vitals Time Taken: 1:42 PM, Height: 68 in, Source: Stated, Weight: 161 lbs, Source: Stated, BMI: 24.5, Temperature: 98.3 F, Pulse: 61 bpm, Respiratory Rate: 18 breaths/min, Blood Pressure: 135/73 mmHg, Capillary Blood Glucose: 154 mg/dl. General Notes: glucose per pt report this  am General Notes: Wound exam; right anterior lower tibia no debridement is required. Granulation looks reasonably healthy. Dimensions are slightly better. No evidence of surrounding infection edema control is good Integumentary (Hair, Skin) Wound #1 status is Open. Original cause of wound was Trauma. The date acquired was: 11/18/2021. The wound has been in treatment 2 weeks. The wound is located on the Right,Anterior Lower Leg. The wound measures 5.8cm length x 1.5cm width x 0.2cm depth; 6.833cm^2 area and 1.367cm^3 volume. There is Fat Layer (Subcutaneous Tissue) exposed. There is no tunneling or undermining noted. There is a medium amount of serosanguineous drainage noted. The wound margin is flat and intact. There is medium (34-66%) red granulation within the wound bed. There is a medium (34-66%) amount of necrotic tissue within the wound bed including Adherent Slough. Assessment Active Problems ICD-10 Laceration without foreign body, right lower leg, subsequent encounter Non-pressure chronic ulcer of other part of right lower leg with other specified severity Chronic venous hypertension (idiopathic) with inflammation of right lower extremity Type 2 diabetes mellitus with diabetic polyneuropathy Procedures Wound #1 Pre-procedure diagnosis of Wound #1 is a Venous Leg Ulcer located on the Right,Anterior Lower Leg . There was a Three Layer Compression Therapy Procedure by Baruch Gouty, RN. Post procedure Diagnosis Wound #1: Same as Pre-Procedure Plan Follow-up Appointments: Return Appointment in 1 week. - Dr. Dellia Nims Bathing/ Shower/ Hygiene: May shower with protection but do not get wound dressing(s) wet. - Ok to use Market researcher, can purchase at [facility], [facility], or [employer] Edema Control - Lymphedema / SCD / Other: Elevate legs to the level of the heart or above for 30 minutes daily and/or when sitting, a frequency of: - throughout the day Avoid standing for long periods of  time. Exercise regularly Moisturize legs daily. - left leg WOUND #1: - Lower Leg Wound Laterality: Right, Anterior Cleanser: Soap and Water 1 x Per Week/ Discharge Instructions: May shower and wash wound with dial antibacterial soap and water prior to dressing change. Cleanser: Wound Cleanser 1 x Per Week/ Discharge Instructions: Cleanse the wound with wound cleanser prior to applying a clean dressing using gauze sponges, not tissue or cotton balls. Peri-Wound Care: Triamcinolone 15 (g) 1 x Per Week/ Discharge Instructions: Use triamcinolone 15 (g) as directed  Peri-Wound Care: Sween Lotion (Moisturizing lotion) 1 x Per Week/ Discharge Instructions: Apply moisturizing lotion as directed Prim Dressing: Hydrofera Blue Classic Foam, 2x2 in 1 x Per Week/ ary Discharge Instructions: Moisten with saline prior to applying to wound bed Secondary Dressing: Woven Gauze Sponge, Non-Sterile 4x4 in 1 x Per Week/ Discharge Instructions: Apply over primary dressing as directed. Secondary Dressing: ABD Pad, 8x10 1 x Per Week/ Discharge Instructions: Apply over primary dressing as directed. Com pression Wrap: ThreePress (3 layer compression wrap) 1 x Per Week/ Discharge Instructions: Apply three layer compression as directed. 1. I change the primary dressing to Hydrofera Blue. Hopefully to stimulate some further granulation. 2. No debridement was required although I cannot rule out going forward based on surface area and depth improvements or lack thereof Electronic Signature(s) Signed: 12/21/2021 5:02:53 PM By: [name] MD Entered By: [name] on 12/21/2021 14:09:12 -------------------------------------------------------------------------------- SuperBill Details Patient Name: Date of Service: Jeremiah Bang 12/21/2021 Medical Record Number: [id number] Patient Account Number: [id number] Date of Birth/Sex: Treating RN: [birth year] (88 y.o. Ulyses Amor, Vaughan Basta Primary Care Provider: [name] Other Clinician: Referring Provider: Treating Provider/Extender: [name], [name] Weeks in Treatment: 2 Diagnosis Coding ICD-10 Codes Code Description [phone] Laceration without foreign body, right lower leg, subsequent encounter L97.818 Non-pressure chronic ulcer of other part of right lower leg with other specified severity I87.321 Chronic venous hypertension (idiopathic) with inflammation of right lower extremity E11.42 Type 2 diabetes mellitus with diabetic polyneuropathy Facility Procedures CPT4 Code: [id number] Description: (Facility Use Only) [phone] - [place] COMPRS LWR RT LEG Modifier: Quantity: 1 Physician Procedures : CPT4 Code Description Modifier [id number] - WC PHYS LEVEL 3 - EST PT ICD-10 Diagnosis Description S81.811D Laceration without foreign body, right lower leg, subsequent encounter L97.818 Non-pressure chronic ulcer of other part of right lower leg  with other specified severity Quantity: 1 Electronic Signature(s) Signed: 12/21/2021 5:02:53 PM By: [name] MD Signed: 12/21/2021 6:03:00 PM By: Baruch Gouty RN, [name] Entered By: Baruch Gouty on 12/21/2021 14:18:49

## 2021-12-21 NOTE — Progress Notes (Signed)
[name], [name] ([id number]) Visit Report for 12/07/2021 Chief Complaint Document Details Patient Name: Date of Service: [name], [name] 12/07/2021 9:00 [place] Record Number: [id number] Patient Account Number: [id number] Date of Birth/Sex: Treating RN: Jul 07, 1932 (88 y.o. [name] Primary Care Provider: Leonides [name] Other Clinician: Referring Provider: Treating Provider/Extender: [name], [name] Weeks in Treatment: 0 Information Obtained from: Patient Chief Complaint 12/07/2021; patient comes in for review of a wound on his right anterior lower leg which was initially traumatic Electronic Signature(s) Signed: 12/07/2021 4:35:26 PM By: Jeremiah Ham [name] Entered By: [name] on 12/07/2021 10:33:35 -------------------------------------------------------------------------------- Debridement Details Patient Name: Date of Service: Jeremiah Bang. 12/07/2021 9:00 [place] Record Number: [id number] Patient Account Number: [id number] Date of Birth/Sex: Treating RN: [birth year] (88 y.o. [name], [name] Primary Care Provider: Leonides [name] Other Clinician: Referring Provider: Treating Provider/Extender: [name] Weeks in Treatment: 0 Debridement Performed for Assessment: Wound #1 Right,Anterior Lower Leg Performed By: Physician [name] Debridement Type: Debridement Severity of Tissue Pre Debridement: Fat layer exposed Level of Consciousness (Pre-procedure): Awake and Alert Pre-procedure Verification/Time Out Yes - 10:05 Taken: Start Time: 10:05 Pain Control: Other : Benzocaine 20% T Area Debrided (L x W): otal 9 (cm) x 2.6 (cm) = 23.4 (cm) Tissue and other material debrided: Viable, Non-Viable, Eschar, Slough, Subcutaneous, Slough Level: Skin/Subcutaneous Tissue Debridement Description: Excisional Instrument: Curette Bleeding: Minimum Hemostasis Achieved: Pressure End Time:  10:08 Procedural Pain: 0 Post Procedural Pain: 0 Response to Treatment: Procedure was tolerated well Level of Consciousness (Post- Awake and Alert procedure): Post Debridement Measurements of Total Wound Length: (cm) 9 Width: (cm) 2.6 Depth: (cm) 0.1 Volume: (cm) 1.838 Character of Wound/Ulcer Post Debridement: Improved Severity of Tissue Post Debridement: Fat layer exposed Post Procedure Diagnosis Same as Pre-procedure Electronic Signature(s) Signed: 12/07/2021 4:35:26 PM By: Jeremiah Ham [name] Signed: 12/07/2021 5:46:53 PM By: [name], [name] Entered By: [name] on 12/07/2021 10:33:14 -------------------------------------------------------------------------------- HPI Details Patient Name: Date of Service: Jeremiah Bang. 12/07/2021 9:00 [place] Medical Record Number: [id number] Patient Account Number: [id number] Date of Birth/Sex: Treating RN: 03/11/1932 (88 y.o. [name] Primary Care Provider: Leonides [name] Other Clinician: Referring Provider: Treating Provider/Extender: [name], [name] Weeks in Treatment: 0 History of Present Illness HPI Description: ADMISSION 12/07/2021 This is a pleasant 88 year old man who arrives in clinic accompanied by his daughter for review of a traumatic wound on the right leg that happened on 11/18/2021. This was a result of a fall he had at home. He was seen in the ER on the same day and had 7 sutures placed. Ultimately the sutures were removed by the family physician on 1/16 however the wound dehisced. They stated that it was hard to get the original dressing off that he had not changed as well. It was felt to be infected. He was given Rocephin and subsequently doxycycline which he finished yesterday. A culture at that time showed methicillin sensitive staph aureus. He comes in the clinic today with eschar over the entire wound surface. There is erythema around the wound but he says he had some of that  even before this happened. He quite clearly has chronic venous insufficiency bilaterally. Past medical history includes basal cell CA of the skin, hypertension, chronic kidney disease, heart failure with preserved ejection fraction, coronary artery disease, carotid stenosis, atrial fib on Eliquis, history of vein stripping in the right leg, type 2 diabetes on insulin  and mitral valve regurgitation ABI in our clinic was 0.92 on the right Electronic Signature(s) Signed: 12/07/2021 4:35:26 PM By: Jeremiah Ham [name] Entered By: [name] on 12/07/2021 10:35:59 -------------------------------------------------------------------------------- Physical Exam Details Patient Name: Date of Service: Jeremiah Bang. 12/07/2021 9:00 A M Medical Record Number: [id number] Patient Account Number: [id number] Date of Birth/Sex: Treating RN: [birth year] (88 y.o. [name] Primary Care Provider: Leonides [name] Other Clinician: Referring Provider: Treating Provider/Extender: [name] Weeks in Treatment: 0 Constitutional Patient is hypertensive.. Pulse regular and within target range for patient.[name] Respirations regular, non-labored and within target range.. Temperature is normal and within the target range for the patient.[name] Appears in no distress. Cardiovascular Pedal pulses were palpable on the right at both the dorsalis pedis posterior tibial and popliteal.. Integumentary (Hair, Skin) Skin changes on the left leg suggestive of chronic venous insufficiency dilated small venules medially in the ankle and foot. Very dry flaking skin.[name] Neurological . Notes Wound exam; the patient had an irregular wound on the right anterior lower tibia. 100% covered in thick adherent black eschar. I gently remove this using a #5 curette also cleaned up some subcutaneous debris from the wound surface. He does have erythema around this area which was marked by his primary physician and  the erythema seems to have receded however there is no tenderness here and I suspect a good portion of this was stasis dermatitis Electronic Signature(s) Signed: 12/07/2021 4:35:26 PM By: Jeremiah Ham [name] Entered By: [name] on 12/07/2021 10:38:17 -------------------------------------------------------------------------------- Physician Orders Details Patient Name: Date of Service: Jeremiah Bang. 12/07/2021 9:00 [place] Medical Record Number: [id number] Patient Account Number: [id number] Date of Birth/Sex: Treating RN: June 24, 1932 (88 y.o. [name] Primary Care Provider: Leonides [name] Other Clinician: Referring Provider: Treating Provider/Extender: [name] Weeks in Treatment: 0 Verbal / Phone Orders: No Diagnosis Coding Follow-up Appointments ppointment in 1 week. - Dr. Dellia Nims Return A Bathing/ Shower/ Hygiene May shower with protection but do not get wound dressing(s) wet. - Ok to use Forensic scientist, can purchase at [facility], [facility], or [employer] Edema Control - Lymphedema / SCD / Other Elevate legs to the level of the heart or above for 30 minutes daily and/or when sitting, a frequency of: - throughout the day Avoid standing for long periods of time. Exercise regularly Moisturize legs daily. - left leg Wound Treatment Wound #1 - Lower Leg Wound Laterality: Right, Anterior Cleanser: Soap and Water 1 x Per Week Discharge Instructions: May shower and wash wound with dial antibacterial soap and water prior to dressing change. Cleanser: Wound Cleanser 1 x Per Week Discharge Instructions: Cleanse the wound with wound cleanser prior to applying a clean dressing using gauze sponges, not tissue or cotton balls. Peri-Wound Care: Triamcinolone 15 (g) 1 x Per Week Discharge Instructions: Use triamcinolone 15 (g) as directed Peri-Wound Care: Sween Lotion (Moisturizing lotion) 1 x Per Week Discharge Instructions: Apply moisturizing lotion as  directed Prim Dressing: KerraCel Ag Gelling Fiber Dressing, 4x5 in (silver alginate) 1 x Per Week ary Discharge Instructions: Apply silver alginate to wound bed as instructed Secondary Dressing: Woven Gauze Sponge, Non-Sterile 4x4 in 1 x Per Week Discharge Instructions: Apply over primary dressing as directed. Secondary Dressing: ABD Pad, 8x10 1 x Per Week Discharge Instructions: Apply over primary dressing as directed. Compression Wrap: ThreePress (3 layer compression wrap) 1 x Per Week Discharge Instructions: Apply three layer compression as directed. Electronic Signature(s) Signed: 12/07/2021 4:35:26 PM By: Dellia Nims,  Legrand Como [name] Signed: 12/07/2021 5:46:53 PM By: [name], [name] Entered By: Levan Hurst on 12/07/2021 10:13:20 -------------------------------------------------------------------------------- Problem List Details Patient Name: Date of Service: Jeremiah Bang. 12/07/2021 9:00 [place] Medical Record Number: [id number] Patient Account Number: [id number] Date of Birth/Sex: Treating RN: 1932/11/11 (88 y.o. [name] Primary Care Provider: Leonides [name] Other Clinician: Referring Provider: Treating Provider/Extender: [name], [name] Weeks in Treatment: 0 Active Problems ICD-10 Encounter Code Description Active Date MDM Diagnosis S81.811D Laceration without foreign body, right lower leg, subsequent encounter 12/07/2021 No Yes L97.818 Non-pressure chronic ulcer of other part of right lower leg with other specified 12/07/2021 No Yes severity I87.321 Chronic venous hypertension (idiopathic) with inflammation of right lower 12/07/2021 No Yes extremity E11.42 Type 2 diabetes mellitus with diabetic polyneuropathy 12/07/2021 No Yes Inactive Problems Resolved Problems Electronic Signature(s) Signed: 12/07/2021 4:35:26 PM By: Jeremiah Ham [name] Entered By: [name] on 12/07/2021  10:41:56 -------------------------------------------------------------------------------- Progress Note Details Patient Name: Date of Service: Jeremiah Bang. 12/07/2021 9:00 A M Medical Record Number: [id number] Patient Account Number: [id number] Date of Birth/Sex: Treating RN: Oct 29, 1932 (88 y.o. [name] Primary Care Provider: Leonides [name] Other Clinician: Referring Provider: Treating Provider/Extender: [name], [name] Weeks in Treatment: 0 Subjective Chief Complaint Information obtained from Patient 12/07/2021; patient comes in for review of a wound on his right anterior lower leg which was initially traumatic History of Present Illness (HPI) ADMISSION 12/07/2021 This is a pleasant 88 year old man who arrives in clinic accompanied by his daughter for review of a traumatic wound on the right leg that happened on 11/18/2021. This was a result of a fall he had at home. He was seen in the ER on the same day and had 7 sutures placed. Ultimately the sutures were removed by the family physician on 1/16 however the wound dehisced. They stated that it was hard to get the original dressing off that he had not changed as well. It was felt to be infected. He was given Rocephin and subsequently doxycycline which he finished yesterday. A culture at that time showed methicillin sensitive staph aureus. He comes in the clinic today with eschar over the entire wound surface. There is erythema around the wound but he says he had some of that even before this happened. He quite clearly has chronic venous insufficiency bilaterally. Past medical history includes basal cell CA of the skin, hypertension, chronic kidney disease, heart failure with preserved ejection fraction, coronary artery disease, carotid stenosis, atrial fib on Eliquis, history of vein stripping in the right leg, type 2 diabetes on insulin and mitral valve regurgitation ABI in our clinic was 0.92 on the  right Patient History Information obtained from Patient. Allergies No Known Allergies Social History Never smoker, Marital Status - Widowed, Alcohol Use - Never, Drug Use - No History, Caffeine Use - Never. Medical History Eyes Denies history of Cataracts, Glaucoma, Optic Neuritis Ear/Nose/Mouth/Throat Denies history of Chronic sinus problems/congestion, Middle ear problems Hematologic/Lymphatic Patient has history of Anemia Denies history of Hemophilia, Human Immunodeficiency Virus, Lymphedema, Sickle Cell Disease Respiratory Denies history of Aspiration, Asthma, Chronic Obstructive Pulmonary Disease (COPD), Pneumothorax, Sleep Apnea, Tuberculosis Cardiovascular Patient has history of Arrhythmia - Afib, Congestive Heart Failure, Coronary Artery Disease, Hypertension, Myocardial Infarction - bypass 1996, 2006 Endocrine Patient has history of Type II Diabetes Genitourinary Denies history of End Stage Renal Disease Musculoskeletal Patient has history of Gout Neurologic Patient has history of Neuropathy Patient is treated with Insulin. Blood sugar  is tested. Medical A Surgical History Notes nd Eyes ARMD Genitourinary CKD III Review of Systems (ROS) Constitutional Symptoms (General Health) Denies complaints or symptoms of Fatigue, Fever, Chills, Marked Weight Change. Eyes Denies complaints or symptoms of Dry Eyes, Vision Changes, Glasses / Contacts. Ear/Nose/Mouth/Throat Denies complaints or symptoms of Chronic sinus problems or rhinitis. Respiratory Denies complaints or symptoms of Chronic or frequent coughs, Shortness of Breath. Cardiovascular Denies complaints or symptoms of Chest pain. Gastrointestinal diverticulitis Genitourinary Denies complaints or symptoms of Frequent urination. Integumentary (Skin) Complains or has symptoms of Wounds - R leg. Oncologic hx skin ca 2012 Psychiatric Denies complaints or symptoms of Claustrophobia,  Suicidal. Objective Constitutional Patient is hypertensive.. Pulse regular and within target range for patient.[name] Respirations regular, non-labored and within target range.. Temperature is normal and within the target range for the patient.[name] Appears in no distress. Vitals Time Taken: 9:12 AM, Height: 68 in, Source: Stated, Weight: 161 lbs, Source: Stated, BMI: 24.5, Temperature: 97.9 F, Pulse: 60 bpm, Respiratory Rate: 18 breaths/min, Blood Pressure: 152/77 mmHg, Capillary Blood Glucose: 158 mg/dl. Cardiovascular Pedal pulses were palpable on the right at both the dorsalis pedis posterior tibial and popliteal.. General Notes: Wound exam; the patient had an irregular wound on the right anterior lower tibia. 100% covered in thick adherent black eschar. I gently remove this using a #5 curette also cleaned up some subcutaneous debris from the wound surface. He does have erythema around this area which was marked by his primary physician and the erythema seems to have receded however there is no tenderness here and I suspect a good portion of this was stasis dermatitis Integumentary (Hair, Skin) Skin changes on the left leg suggestive of chronic venous insufficiency dilated small venules medially in the ankle and foot. Very dry flaking skin.. Wound #1 status is Open. Original cause of wound was Trauma. The date acquired was: 11/18/2021. The wound is located on the Right,Anterior Lower Leg. The wound measures 9cm length x 2.6cm width x 0.1cm depth; 18.378cm^2 area and 1.838cm^3 volume. There is Fat Layer (Subcutaneous Tissue) exposed. There is a medium amount of serosanguineous drainage noted. The wound margin is flat and intact. There is small (1-33%) red granulation within the wound bed. There is a large (67-100%) amount of necrotic tissue within the wound bed including Eschar and Adherent Slough. Assessment Active Problems ICD-10 Laceration without foreign body, right lower leg, subsequent  encounter Non-pressure chronic ulcer of other part of right lower leg with other specified severity Chronic venous hypertension (idiopathic) with inflammation of right lower extremity Type 2 diabetes mellitus with diabetic polyneuropathy Procedures Wound #1 Pre-procedure diagnosis of Wound #1 is a Venous Leg Ulcer located on the Right,Anterior Lower Leg .Severity of Tissue Pre Debridement is: Fat layer exposed. There was a Excisional Skin/Subcutaneous Tissue Debridement with a total area of 23.4 sq cm performed by [name]. With the following instrument(s): Curette to remove Viable and Non-Viable tissue/material. Material removed includes Eschar, Subcutaneous Tissue, and Slough after achieving pain control using Other (Benzocaine 20%). No specimens were taken. A time out was conducted at 10:05, prior to the start of the procedure. A Minimum amount of bleeding was controlled with Pressure. The procedure was tolerated well with a pain level of 0 throughout and a pain level of 0 following the procedure. Post Debridement Measurements: 9cm length x 2.6cm width x 0.1cm depth; 1.838cm^3 volume. Character of Wound/Ulcer Post Debridement is improved. Severity of Tissue Post Debridement is: Fat layer exposed. Post procedure Diagnosis Wound #  1: Same as Pre-Procedure Pre-procedure diagnosis of Wound #1 is a Venous Leg Ulcer located on the Right,Anterior Lower Leg . There was a Three Layer Compression Therapy Procedure by Levan Hurst, RN. Post procedure Diagnosis Wound #1: Same as Pre-Procedure Plan Follow-up Appointments: Return Appointment in 1 week. - Dr. Dellia Nims Bathing/ Shower/ Hygiene: May shower with protection but do not get wound dressing(s) wet. - Ok to use Forensic scientist, can purchase at [facility], [facility], or [employer] Edema Control - Lymphedema / SCD / Other: Elevate legs to the level of the heart or above for 30 minutes daily and/or when sitting, a frequency of: - throughout the  day Avoid standing for long periods of time. Exercise regularly Moisturize legs daily. - left leg WOUND #1: - Lower Leg Wound Laterality: Right, Anterior Cleanser: Soap and Water 1 x Per Week/ Discharge Instructions: May shower and wash wound with dial antibacterial soap and water prior to dressing change. Cleanser: Wound Cleanser 1 x Per Week/ Discharge Instructions: Cleanse the wound with wound cleanser prior to applying a clean dressing using gauze sponges, not tissue or cotton balls. Peri-Wound Care: Triamcinolone 15 (g) 1 x Per Week/ Discharge Instructions: Use triamcinolone 15 (g) as directed Peri-Wound Care: Sween Lotion (Moisturizing lotion) 1 x Per Week/ Discharge Instructions: Apply moisturizing lotion as directed Prim Dressing: KerraCel Ag Gelling Fiber Dressing, 4x5 in (silver alginate) 1 x Per Week/ ary Discharge Instructions: Apply silver alginate to wound bed as instructed Secondary Dressing: Woven Gauze Sponge, Non-Sterile 4x4 in 1 x Per Week/ Discharge Instructions: Apply over primary dressing as directed. Secondary Dressing: ABD Pad, 8x10 1 x Per Week/ Discharge Instructions: Apply over primary dressing as directed. Compression Wrap: ThreePress (3 layer compression wrap) 1 x Per Week/ Discharge Instructions: Apply three layer compression as directed. 1. The wound cleaned up quite nicely postdebridement 2 we will use silver alginate on the wound surface liberal TCA and moisturizer around the wound on the affected skin. ABDs under 3 layer compression. 2. I see no current evidence of infection and no need for additional antibiotics or cultures. He just finished doxycycline yesterday 3. The patient has a history of chronic venous disease he had a vein stripping on the right. Not much in the way of edema. We did put him in 3 layer compression as he quite clearly has venous hypertension and stasis dermatitis.. The aim is to leave this in place all week 4. The patient is a type  II diabetic on insulin and has peripheral neuropathy accounting for some of his fall risks. 5. May look at this next week with [employer] if the wound does not received/improvement dimensions Electronic Signature(s) Signed: 12/07/2021 4:35:26 PM By: Jeremiah Ham [name] Entered By: [name] on 12/07/2021 10:42:55 -------------------------------------------------------------------------------- HxROS Details Patient Name: Date of Service: Jeremiah Bang. 12/07/2021 9:00 A [place] Medical Record Number: [id number] Patient Account Number: [id number] Date of Birth/Sex: Treating RN: 11-30-1931 (88 y.o. Mare Ferrari Primary Care Provider: Leonides [name] Other Clinician: Referring Provider: Treating Provider/Extender: [name] Weeks in Treatment: 0 Information Obtained From Patient Constitutional Symptoms (General Health) Complaints and Symptoms: Negative for: Fatigue; Fever; Chills; Marked Weight Change Eyes Complaints and Symptoms: Negative for: Dry Eyes; Vision Changes; Glasses / Contacts Medical History: Negative for: Cataracts; Glaucoma; Optic Neuritis Past Medical History Notes: ARMD Ear/Nose/Mouth/Throat Complaints and Symptoms: Negative for: Chronic sinus problems or rhinitis Medical History: Negative for: Chronic sinus problems/congestion; Middle ear problems Respiratory Complaints and Symptoms: Negative for: Chronic or frequent coughs;  Shortness of Breath Medical History: Negative for: Aspiration; Asthma; Chronic Obstructive Pulmonary Disease (COPD); Pneumothorax; Sleep Apnea; Tuberculosis Cardiovascular Complaints and Symptoms: Negative for: Chest pain Medical History: Positive for: Arrhythmia - Afib; Congestive Heart Failure; Coronary Artery Disease; Hypertension; Myocardial Infarction - bypass 1996, 2006 Genitourinary Complaints and Symptoms: Negative for: Frequent urination Medical History: Negative for: End Stage Renal  Disease Past Medical History Notes: CKD III Integumentary (Skin) Complaints and Symptoms: Positive for: Wounds - R leg Psychiatric Complaints and Symptoms: Negative for: Claustrophobia; Suicidal Hematologic/Lymphatic Medical History: Positive for: Anemia Negative for: Hemophilia; Human Immunodeficiency Virus; Lymphedema; Sickle Cell Disease Gastrointestinal Complaints and Symptoms: Review of System Notes: diverticulitis Endocrine Medical History: Positive for: Type II Diabetes Time with diabetes: 10+yrs Treated with: Insulin Blood sugar tested every day: Yes Tested : once in morning Immunological Musculoskeletal Medical History: Positive for: Gout Neurologic Medical History: Positive for: Neuropathy Oncologic Complaints and Symptoms: Review of System Notes: hx skin ca 2012 Immunizations Pneumococcal Vaccine: Received Pneumococcal Vaccination: Yes Received Pneumococcal Vaccination On or After 60th Birthday: Yes Implantable Devices None Family and Social History Never smoker; Marital Status - Widowed; Alcohol Use: Never; Drug Use: No History; Caffeine Use: Never; Financial Concerns: No; Food, Clothing or Shelter Needs: No; Support System Lacking: No; Transportation Concerns: No Engineer, maintenance) Signed: 12/07/2021 4:35:26 PM By: Jeremiah Ham [name] Signed: 12/21/2021 8:14:39 AM By: Sharyn Creamer RN, [name] Signed: 12/21/2021 8:14:39 AM By: Sharyn Creamer RN, [name] Entered By: Sharyn Creamer on 12/07/2021 09:30:19 -------------------------------------------------------------------------------- [place] Details Patient Name: Date of Service: [name], [name] 12/07/2021 Medical Record Number: [id number] Patient Account Number: [id number] Date of Birth/Sex: Treating RN: 1932-09-12 (88 y.o. [name], [name] Primary Care Provider: Leonides [name] Other Clinician: Referring Provider: Treating Provider/Extender: [name], [name] Weeks in  Treatment: 0 Diagnosis Coding ICD-10 Codes Code Description [id number] Laceration without foreign body, right lower leg, subsequent encounter L97.818 Non-pressure chronic ulcer of other part of right lower leg with other specified severity I87.321 Chronic venous hypertension (idiopathic) with inflammation of right lower extremity E11.42 Type 2 diabetes mellitus with diabetic polyneuropathy Facility Procedures CPT4 Code: 22979892 Description: [place] VISIT-LEV 3 EST PT Modifier: 25 Quantity: 1 CPT4 Code: 11941740 Description: 81448 - DEB SUBQ TISSUE 20 SQ CM/< ICD-10 Diagnosis Description L97.818 Non-pressure chronic ulcer of other part of right lower leg with other specified Modifier: severity Quantity: 1 CPT4 Code: [id number] Description: 70263 - DEB SUBQ TISS EA ADDL 20CM ICD-10 Diagnosis Description L97.818 Non-pressure chronic ulcer of other part of right lower leg with other specified Modifier: severity Quantity: 1 Physician Procedures : CPT4 Code Description Modifier [id number] 27741 - WC PHYS LEVEL 4 - NEW PT 25 ICD-10 Diagnosis Description S81.811D Laceration without foreign body, right lower leg, subsequent encounter L97.818 Non-pressure chronic ulcer of other part of right lower leg  with other specified severity I87.321 Chronic venous hypertension (idiopathic) with inflammation of right lower extremity E11.42 Type 2 diabetes mellitus with diabetic polyneuropathy Quantity: 1 : [id number] 11042 - WC PHYS SUBQ TISS 20 SQ CM ICD-10 Diagnosis Description L97.818 Non-pressure chronic ulcer of other part of right lower leg with other specified severity Quantity: 1 : [id number] 11045 - WC PHYS SUBQ TISS EA ADDL 20 CM ICD-10 Diagnosis Description L97.818 Non-pressure chronic ulcer of other part of right lower leg with other specified severity Quantity: 1 Electronic Signature(s) Signed: 12/07/2021 4:35:26 PM By: Jeremiah Ham [name] Signed: 12/07/2021 5:46:53 PM By: [name],  [name] Entered By: Levan Hurst on 12/07/2021 11:42:47

## 2021-12-21 NOTE — Progress Notes (Signed)
[name] ([id number]) Visit Report for 12/07/2021 Abuse Risk Screen Details Patient Name: Date of Service: [name], [name] 12/07/2021 9:00 [place] Record Number: [id number] Patient Account Number: [id number] Date of Birth/Sex: Treating RN: [name] (88 y.o. [name] Primary Care [name]: [name] Other Clinician: Referring [name]: Treating Iretha Kirley/Extender: [name] in Treatment: 0 Abuse Risk Screen Items Answer ABUSE RISK SCREEN: Has anyone close to you tried to hurt or harm you recentlyo No Do you feel uncomfortable with anyone in your familyo No Has anyone forced you do things that you didnt want to doo No Electronic Signature(s) Signed: 12/21/2021 8:14:39 AM By: Jeremiah Creamer RN, [name] Entered By: [name] on 12/07/2021 09:30:32 -------------------------------------------------------------------------------- Activities of Daily Living Details Patient Name: Date of Service: [name] 12/07/2021 9:00 [place] Record Number: [id number] Patient Account Number: [id number] Date of Birth/Sex: Treating RN: [name] (89 y.o. [name] Primary Care [name]: [name] Other Clinician: Referring [name]: Treating [name]/Extender: [name] in Treatment: 0 Activities of Daily Living Items Answer Activities of Daily Living (Please select one for each item) Drive Automobile Not Able T Medications ake Completely Able Use T elephone Completely Able Care for Appearance Completely Able Use T oilet Completely Able Bath / Shower Completely Able Dress Self Completely Able Feed Self Completely Able Walk Completely Able Get In / Out Bed Completely Able Housework Completely Able Prepare Meals Completely Able Handle Money Completely Able Shop for Self Completely Able Electronic Signature(s) Signed: 12/21/2021 8:14:39 AM By: Jeremiah Creamer RN, [name] Entered  By: [name] on 12/07/2021 09:33:01 -------------------------------------------------------------------------------- Education Screening Details Patient Name: Date of Service: [name]. 12/07/2021 9:00 [place] Record Number: [id number] Patient Account Number: [id number] Date of Birth/Sex: Treating RN: [birth year] (88 y.o. [name] Primary Care [name]: [name] Other Clinician: Referring [name]: Treating [name]/Extender: [name] in Treatment: 0 Primary Learner Assessed: Patient Learning Preferences/Education Level/Primary Language Learning Preference: Explanation, Demonstration Highest Education Level: College or Above Preferred Language: English Cognitive Barrier Language Barrier: No Translator Needed: No Memory Deficit: No Emotional Barrier: No Cultural/Religious Beliefs Affecting Medical Care: No Physical Barrier Impaired Vision: Yes Impaired Hearing: Yes Decreased Hand dexterity: No Knowledge/Comprehension Knowledge Level: High Comprehension Level: High Ability to understand written instructions: High Ability to understand verbal instructions: High Motivation Anxiety Level: Calm Cooperation: Cooperative Education Importance: Acknowledges Need Interest in Health Problems: Asks Questions Perception: Coherent Willingness to Engage in Self-Management High Activities: Readiness to Engage in Self-Management High Activities: Electronic Signature(s) Signed: 12/21/2021 8:14:39 AM By: Jeremiah Creamer RN, [name] Entered By: [name] on 12/07/2021 09:34:10 -------------------------------------------------------------------------------- Fall Risk Assessment Details Patient Name: Date of Service: [name]. 12/07/2021 9:00 [facility] Record Number: [id number] Patient Account Number: [id number] Date of Birth/Sex: Treating RN: [birth year] (89 y.o. [name] Primary Care [name]: [name] Other Clinician: Referring [name]: Treating [name] in Treatment: 0 Fall Risk Assessment Items Have you had 2 or more falls in the last 12 monthso 0 Yes Have you had any fall that resulted in injury in the last 12 monthso 0 Yes FALLS RISK SCREEN History of falling - immediate or within 3 months 25 Yes Secondary diagnosis (Do you have 2 or more medical diagnoseso) 15 Yes Ambulatory aid None/bed rest/wheelchair/nurse 0 No Crutches/cane/walker 15 Yes Furniture 0 No Intravenous therapy Access/Saline/Heparin Lock 0 No Gait/Transferring Normal/ bed rest/  wheelchair 0 Yes Weak (short steps with or without shuffle, stooped but able to lift head while walking, may seek 0 No support from furniture) Impaired (short steps with shuffle, may have difficulty arising from chair, head down, impaired 0 No balance) Mental Status Oriented to own ability 0 Yes Electronic Signature(s) Signed: 12/21/2021 8:14:39 AM By: Jeremiah Creamer RN, [name] Entered By: [name] on 12/07/2021 09:35:52 -------------------------------------------------------------------------------- Foot Assessment Details Patient Name: Date of Service: [name]. 12/07/2021 9:00 [facility] Record Number: [id number] Patient Account Number: [id number] Date of Birth/Sex: Treating RN: [name] (88 y.o. [name] Primary Care Kristien Salatino: [name] Other Clinician: Referring Shianne Zeiser: Treating Ramanda Paules/Extender: [name] in Treatment: 0 Foot Assessment Items Site Locations + = Sensation present, - = Sensation absent, C = Callus, U = Ulcer R = Redness, W = Warmth, M = Maceration, PU = Pre-ulcerative lesion F = Fissure, S = Swelling, D = Dryness Assessment Right: Left: Other Deformity: No No Prior Foot Ulcer: No No Prior Amputation: No No Charcot Joint: Yes Yes Ambulatory Status: Gait: Electronic  Signature(s) Signed: 12/21/2021 8:14:39 AM By: Jeremiah Creamer RN, [name] Entered By: [name] on 12/07/2021 09:39:57 -------------------------------------------------------------------------------- Nutrition Risk Screening Details Patient Name: Date of Service: [name]. 12/07/2021 9:00 [facility] Medical Record Number: [id number] Patient Account Number: [id number] Date of Birth/Sex: Treating RN: [birth year] (88 y.o. [name] Primary Care Alegandra Sommers: [name] Other Clinician: Referring Chike Farrington: Treating Luv Mish/Extender: [name] in Treatment: 0 Height (in): 68 Weight (lbs): 161 Body Mass Index (BMI): 24.5 Nutrition Risk Screening Items Score Screening NUTRITION RISK SCREEN: I have an illness or condition that made me change the kind and/or amount of food I eat 0 No I eat fewer than two meals per day 0 No I eat few fruits and vegetables, or milk products 2 Yes I have three or more drinks of beer, liquor or wine almost every day 0 No I have tooth or mouth problems that make it hard for me to eat 0 No I don't always have enough money to buy the food I need 0 No I eat alone most of the time 1 Yes I take three or more different prescribed or over-the-counter drugs a day 1 Yes Without wanting to, I have lost or gained 10 pounds in the last six months 2 Yes I am not always physically able to shop, cook and/or feed myself 0 No Nutrition Protocols Good Risk Protocol Moderate Risk Protocol High Risk Proctocol Risk Level: High Risk Score: 6 Electronic Signature(s) Signed: 12/21/2021 8:14:39 AM By: Jeremiah Creamer RN, [name] Entered By: [name] on 12/07/2021 09:37:20

## 2021-12-21 NOTE — Progress Notes (Signed)
[name], [name] ([id number]) Visit Report for 12/07/2021 Allergy List Details Patient Name: Date of Service: [name], [name] 12/07/2021 9:00 [place] Record Number: [id number] Patient Account Number: [id number] Date of Birth/Sex: Treating RN: [name][birth year] (88 y.o. Mare Ferrari Primary Care Jesselle Laflamme: [name] Other Clinician: Referring Leeroy Lovings: Treating Rayne Cowdrey/Extender: Earnest Conroy Weeks in Treatment: 0 Allergies Active Allergies No Known Allergies Allergy Notes Electronic Signature(s) Signed: 12/21/2021 8:14:39 AM By: Sharyn Creamer RN, [name] Entered By: Sharyn Creamer on 12/07/2021 09:16:05 -------------------------------------------------------------------------------- Arrival Information Details Patient Name: Date of Service: Jeremiah Bang. 12/07/2021 9:00 A M Medical Record Number: [id number] Patient Account Number: [id number] Date of Birth/Sex: Treating RN: [birth year] (88 y.o. Mare Ferrari Primary Care Kysha Muralles: [name] Other Clinician: Referring Fiza Nation: Treating Keaira Whitehurst/Extender: Earnest Conroy Weeks in Treatment: 0 Visit Information Patient Arrived: Ambulatory Arrival Time: 09:12 Accompanied By: daughter Transfer Assistance: None Patient Identification Verified: Yes Secondary Verification Process Completed: Yes Patient Requires Transmission-Based Precautions: No Patient Has Alerts: Yes Patient Alerts: Patient on Blood Thinner Electronic Signature(s) Signed: 12/21/2021 8:14:39 AM By: Sharyn Creamer RN, [name] Entered By: Sharyn Creamer on 12/07/2021 09:13:36 -------------------------------------------------------------------------------- Clinic Level of Care Assessment Details Patient Name: Date of Service: ZABIAN, SWAYNE 12/07/2021 9:00 A M Medical Record Number: [id number] Patient Account Number: [id number] Date of Birth/Sex: Treating RN: [birth year] (88 y.o. [name] Primary  Care Madaleine Simmon: [name] Other Clinician: Referring Jlynn Ly: Treating Hazaiah Edgecombe/Extender: Earnest Conroy Weeks in Treatment: 0 Clinic Level of Care Assessment Items TOOL 1 Quantity Score X- 1 0 Use when EandM and Procedure is performed on INITIAL visit ASSESSMENTS - Nursing Assessment / Reassessment X- 1 20 General Physical Exam (combine w/ comprehensive assessment (listed just below) when performed on new pt. evals) X- 1 25 Comprehensive Assessment (HX, ROS, Risk Assessments, Wounds Hx, etc.) ASSESSMENTS - Wound and Skin Assessment / Reassessment []  - 0 Dermatologic / Skin Assessment (not related to wound area) ASSESSMENTS - Ostomy and/or Continence Assessment and Care []  - 0 Incontinence Assessment and Management []  - 0 Ostomy Care Assessment and Management (repouching, etc.) PROCESS - Coordination of Care X - Simple Patient / Family Education for ongoing care 1 15 []  - 0 Complex (extensive) Patient / Family Education for ongoing care X- 1 10 Staff obtains Programmer, systems, Records, T Results / Process Orders est []  - 0 Staff telephones HHA, Nursing Homes / Clarify orders / etc []  - 0 Routine Transfer to another Facility (non-emergent condition) []  - 0 Routine Hospital Admission (non-emergent condition) X- 1 15 New Admissions / Biomedical engineer / Ordering NPWT Apligraf, etc. , []  - 0 Emergency Hospital Admission (emergent condition) PROCESS - Special Needs []  - 0 Pediatric / Minor Patient Management []  - 0 Isolation Patient Management []  - 0 Hearing / Language / Visual special needs []  - 0 Assessment of Community assistance (transportation, D/C planning, etc.) []  - 0 Additional assistance / Altered mentation []  - 0 Support Surface(s) Assessment (bed, cushion, seat, etc.) INTERVENTIONS - Miscellaneous []  - 0 External ear exam []  - 0 Patient Transfer (multiple staff / Civil Service fast streamer / Similar devices) []  - 0 Simple Staple / Suture  removal (25 or less) []  - 0 Complex Staple / Suture removal (26 or more) []  - 0 Hypo/Hyperglycemic Management (do not check if billed separately) X- 1 15 Ankle / Brachial Index (ABI) - do not check if billed separately Has the patient been seen at the hospital within the last  three years: Yes Total Score: 100 Level Of Care: New/Established - Level 3 Electronic Signature(s) Signed: 12/07/2021 5:46:53 PM By: [name], [name] Entered By: Levan Hurst on 12/07/2021 10:12:08 -------------------------------------------------------------------------------- Compression Therapy Details Patient Name: Date of Service: Jeremiah Bang. 12/07/2021 9:00 A M Medical Record Number: [id number] Patient Account Number: [id number] Date of Birth/Sex: Treating RN: [birth year][name] (88 y.o. [name] Primary Care Sixto Bowdish: [name] Other Clinician: Referring Chrissie Dacquisto: Treating Alexianna Nachreiner/Extender: Earnest Conroy Weeks in Treatment: 0 Compression Therapy Performed for Wound Assessment: Wound #1 Right,Anterior Lower Leg Performed By: Clinician Levan Hurst, RN Compression Type: Three Layer Post Procedure Diagnosis Same as Pre-procedure Electronic Signature(s) Signed: 12/07/2021 5:46:53 PM By: [name], [name] Entered By: Levan Hurst on 12/07/2021 10:09:46 -------------------------------------------------------------------------------- Encounter Discharge Information Details Patient Name: Date of Service: Jeremiah Bang. 12/07/2021 9:00 [place] Record Number: [id number] Patient Account Number: [id number] Date of Birth/Sex: Treating RN: [birth year] (88 y.o. Mare Ferrari Primary Care Gianluca Chhim: [name] Other Clinician: Referring Jenisse Vullo: Treating Mylani Gentry/Extender: Earnest Conroy Weeks in Treatment: 0 Encounter Discharge Information Items Post Procedure Vitals Discharge Condition: Stable Temperature (F):  97.9 Ambulatory Status: Cane Pulse (bpm): 60 Discharge Destination: Home Respiratory Rate (breaths/min): 18 Transportation: Private Auto Blood Pressure (mmHg): 152/77 Accompanied By: daughter Schedule Follow-up Appointment: Yes Clinical Summary of Care: Patient Declined Electronic Signature(s) Signed: 12/21/2021 8:14:39 AM By: Sharyn Creamer RN, [name] Entered By: Sharyn Creamer on 12/07/2021 11:42:01 -------------------------------------------------------------------------------- Lower Extremity Assessment Details Patient Name: Date of Service: Jeremiah Bang. 12/07/2021 9:00 [place] Record Number: [id number] Patient Account Number: [id number] Date of Birth/Sex: Treating RN: [birth year][name] (88 y.o. Mare Ferrari Primary Care Faelyn Sigler: [name] Other Clinician: Referring Mong Neal: Treating Lon Klippel/Extender: Earnest Conroy Weeks in Treatment: 0 Edema Assessment Assessed: [name]: No] Patrice Paradise: No] E[Left: dema] [Right: :] Calf Left: Right: Point of Measurement: 33 cm From Medial Instep 32.5 cm Ankle Left: Right: Point of Measurement: 11 cm From Medial Instep 22.5 cm Vascular Assessment Pulses: Dorsalis Pedis Palpable: [Right:No] Doppler Audible: [Right:Yes] Blood Pressure: Brachial: [Right:152] Ankle: [Right:Dorsalis Pedis: 140 0.92] Electronic Signature(s) Signed: 12/21/2021 8:14:39 AM By: Sharyn Creamer RN, [name] Entered By: Sharyn Creamer on 12/07/2021 09:48:00 -------------------------------------------------------------------------------- Multi Wound Chart Details Patient Name: Date of Service: Jeremiah Bang. 12/07/2021 9:00 A M Medical Record Number: [id number] Patient Account Number: [id number] Date of Birth/Sex: Treating RN: [birth year]-[name] (88 y.o. [name], [name] Primary Care Makynna Manocchio: [name] Other Clinician: Referring Aubre Quincy: Treating Lazariah Savard/Extender: Earnest Conroy Weeks in Treatment:  0 Vital Signs Height(in): 38 Capillary Blood Glucose(mg/dl): 158 Weight(lbs): 161 Pulse(bpm): 63 Body Mass Index(BMI): 24.5 Blood Pressure(mmHg): 152/77 Temperature(F): 97.9 Respiratory Rate(breaths/min): 18 Photos: [1:No Photos Right, Anterior Lower Leg] [N/A:N/A N/A] Wound Location: [1:Trauma] [N/A:N/A] Wounding Event: [1:Venous Leg Ulcer] [N/A:N/A] Primary Etiology: [1:Anemia, Arrhythmia, Congestive Heart N/A] Comorbid History: [1:Failure, Coronary Artery Disease, Hypertension, Myocardial Infarction, Type II Diabetes, Gout, Neuropathy 11/18/2021] [N/A:N/A] Date Acquired: [1:0] [N/A:N/A] Weeks of Treatment: [1:Open] [N/A:N/A] Wound Status: [1:No] [N/A:N/A] Wound Recurrence: [1:9x2.6x0.1] [N/A:N/A] Measurements L x W x D (cm) [1:18.378] [N/A:N/A] A (cm) : rea [1:1.838] [N/A:N/A] Volume (cm) : [1:Full Thickness Without Exposed] [N/A:N/A] Classification: [1:Support Structures Medium] [N/A:N/A] Exudate Amount: [1:Serosanguineous] [N/A:N/A] Exudate Type: [1:red, brown] [N/A:N/A] Exudate Color: [1:Flat and Intact] [N/A:N/A] Wound Margin: [1:Small (1-33%)] [N/A:N/A] Granulation Amount: [1:Red] [N/A:N/A] Granulation Quality: [1:Large (67-100%)] [N/A:N/A] Necrotic Amount: [1:Eschar, Adherent Slough] [N/A:N/A] Necrotic Tissue: [1:Fat Layer (Subcutaneous Tissue): Yes N/A] Exposed Structures: [1:Fascia:  No Tendon: No Muscle: No Joint: No Bone: No None] [N/A:N/A] Epithelialization: [1:Debridement - Excisional] [N/A:N/A] Debridement: Pre-procedure Verification/Time Out 10:05 [N/A:N/A] Taken: [1:Other] [N/A:N/A] Pain Control: [1:Necrotic/Eschar, Subcutaneous,] [N/A:N/A] Tissue Debrided: [1:Slough Skin/Subcutaneous Tissue] [N/A:N/A] Level: [1:[name].4] [N/A:N/A] Debridement A (sq cm): [1:rea Curette] [N/A:N/A] Instrument: [1:Minimum] [N/A:N/A] Bleeding: [1:Pressure] [N/A:N/A] Hemostasis Achieved: [1:0] [N/A:N/A] Procedural Pain: [1:0] [N/A:N/A] Post Procedural Pain: Debridement  Treatment Response: Procedure was tolerated well [N/A:N/A] Post Debridement Measurements L x 9x2.6x0.1 [N/A:N/A] W x D (cm) [1:1.838] [N/A:N/A] Post Debridement Volume: (cm) [1:Compression Therapy] [N/A:N/A] Procedures Performed: [1:Debridement] Treatment Notes Electronic Signature(s) Signed: 12/07/2021 4:35:26 PM By: [name] MD Signed: 12/07/2021 5:46:53 PM By: [name], [name] Entered By: [name] on 12/07/2021 10:33:07 -------------------------------------------------------------------------------- Multi-Disciplinary Care Plan Details Patient Name: Date of Service: Jeremiah Bang. 12/07/2021 9:00 A M Medical Record Number: [id number] Patient Account Number: [id number] Date of Birth/Sex: Treating RN: [name], [name] (88 y.o. [name], [name] Primary Care Swara Donze: [name] Other Clinician: Referring Moroni Nester: Treating Roxane Puerto/Extender: Veneta Penton in Treatment: 0 Multidisciplinary Care Plan reviewed with physician Active Inactive Abuse / Safety / Falls / Self Care Management Nursing Diagnoses: History of Falls Potential for injury related to falls Goals: Patient will not experience any injury related to falls Date Initiated: 12/07/2021 Target Resolution Date: 01/07/2022 Goal Status: Active Patient/caregiver will verbalize/demonstrate measures taken to prevent injury and/or falls Date Initiated: 12/07/2021 Target Resolution Date: 01/07/2022 Goal Status: Active Interventions: Assess Activities of Daily Living upon admission and as needed Assess fall risk on admission and as needed Assess: immobility, friction, shearing, incontinence upon admission and as needed Assess impairment of mobility on admission and as needed per policy Assess personal safety and home safety (as indicated) on admission and as needed Assess self care needs on admission and as needed Provide education on fall prevention Provide education on  personal and home safety Notes: Nutrition Nursing Diagnoses: Impaired glucose control: actual or potential Potential for alteratiion in Nutrition/Potential for imbalanced nutrition Goals: Patient/caregiver agrees to and verbalizes understanding of need to use nutritional supplements and/or vitamins as prescribed Date Initiated: 12/07/2021 Target Resolution Date: 01/07/2022 Goal Status: Active Patient/caregiver will maintain therapeutic glucose control Date Initiated: 12/07/2021 Target Resolution Date: 01/07/2022 Goal Status: Active Interventions: Assess HgA1c results as ordered upon admission and as needed Assess patient nutrition upon admission and as needed per policy Provide education on elevated blood sugars and impact on wound healing Provide education on nutrition Notes: Wound/Skin Impairment Nursing Diagnoses: Impaired tissue integrity Knowledge deficit related to ulceration/compromised skin integrity Goals: Patient/caregiver will verbalize understanding of skin care regimen Date Initiated: 12/07/2021 Target Resolution Date: 01/07/2022 Goal Status: Active Ulcer/skin breakdown will have a volume reduction of 30% by week 4 Date Initiated: 12/07/2021 Target Resolution Date: 01/07/2022 Goal Status: Active Interventions: Assess patient/caregiver ability to obtain necessary supplies Assess patient/caregiver ability to perform ulcer/skin care regimen upon admission and as needed Assess ulceration(s) every visit Provide education on ulcer and skin care Notes: Electronic Signature(s) Signed: 12/07/2021 5:46:53 PM By: [name], [name] Entered By: Levan Hurst on 12/07/2021 10:11:25 -------------------------------------------------------------------------------- Pain Assessment Details Patient Name: Date of Service: Jeremiah Bang. 12/07/2021 9:00 A M Medical Record Number: [id number] Patient Account Number: [id number] Date of Birth/Sex: Treating RN: Sep [birth year], [name] (88 y.o. Mare Ferrari Primary Care Kaydyn Chism: [name] Other Clinician: Referring Hailynn Slovacek: Treating Jamaree Hosier/Extender: Earnest Conroy Weeks in Treatment: 0 Active Problems Location of Pain Severity and Description of Pain Patient Has Paino No Site Locations  Rate the pain. Current Pain Level: 0 Pain Management and Medication Current Pain Management: Electronic Signature(s) Signed: 12/21/2021 8:14:39 AM By: Sharyn Creamer RN, [name] Entered By: Sharyn Creamer on 12/07/2021 09:52:57 -------------------------------------------------------------------------------- Patient/Caregiver Education Details Patient Name: Date of Service: [name], [name] 1/24/2023andnbsp9:00 [place] Record Number: [id number] Patient Account Number: [id number] Date of Birth/Gender: Treating RN: [birth year], [name] (88 y.o. [name] Primary Care Physician: [name] Other Clinician: Referring Physician: Treating Physician/Extender: Veneta Penton in Treatment: 0 Education Assessment Education Provided To: Patient Education Topics Provided Elevated Blood Sugar/ Impact on Healing: Methods: Explain/Verbal Responses: State content correctly Nutrition: Methods: Explain/Verbal Responses: State content correctly Safety: Methods: Explain/Verbal Responses: State content correctly Wound/Skin Impairment: Methods: Explain/Verbal Responses: State content correctly Electronic Signature(s) Signed: 12/07/2021 5:46:53 PM By: [name], [name] Entered By: Levan Hurst on 12/07/2021 10:11:43 -------------------------------------------------------------------------------- Wound Assessment Details Patient Name: Date of Service: Jeremiah Bang. 12/07/2021 9:00 A M Medical Record Number: [id number] Patient Account Number: [id number] Date of Birth/Sex: Treating RN: [birth year]-Jeremiah-[name] (88 y.o. Mare Ferrari Primary Care Dawn Convery: [name] Other Clinician: Referring Annajulia Lewing: Treating Doreather Hoxworth/Extender: Earnest Conroy Weeks in Treatment: 0 Wound Status Wound Number: 1 Primary Venous Leg Ulcer Etiology: Wound Location: Right, Anterior Lower Leg Wound Open Wounding Event: Trauma Status: Date Acquired: 11/18/2021 Comorbid Anemia, Arrhythmia, Congestive Heart Failure, Coronary Artery Weeks Of Treatment: 0 History: Disease, Hypertension, Myocardial Infarction, Type II Diabetes, Clustered Wound: No Gout, Neuropathy Photos Wound Measurements Length: (cm) 9 Width: (cm) 2.6 Depth: (cm) 0.1 Area: (cm) 18.378 Volume: (cm) 1.838 % Reduction in Area: 0% % Reduction in Volume: 0% Epithelialization: None Wound Description Classification: Full Thickness Without Exposed Support Structures Wound Margin: Flat and Intact Exudate Amount: Medium Exudate Type: Serosanguineous Exudate Color: red, brown Foul Odor After Cleansing: No Slough/Fibrino Yes Wound Bed Granulation Amount: Small (1-33%) Exposed Structure Granulation Quality: Red Fascia Exposed: No Necrotic Amount: Large (67-100%) Fat Layer (Subcutaneous Tissue) Exposed: Yes Necrotic Quality: Eschar, Adherent Slough Tendon Exposed: No Muscle Exposed: No Joint Exposed: No Bone Exposed: No Electronic Signature(s) Signed: 12/07/2021 1:34:00 PM By: Valeria Batman EMT Signed: 12/21/2021 8:14:39 AM By: Sharyn Creamer RN, [name] Entered By: Valeria Batman on 12/07/2021 13:33:59 -------------------------------------------------------------------------------- Vitals Details Patient Name: Date of Service: Jeremiah Bang. 12/07/2021 9:00 A M Medical Record Number: [id number] Patient Account Number: [id number] Date of Birth/Sex: Treating RN: [birth year], [name] (88 y.o. Mare Ferrari Primary Care Rasheedah Reis: [name] Other Clinician: Referring Zarriah Starkel: Treating Khylan Sawyer/Extender: Earnest Conroy Weeks in Treatment: 0 Vital  Signs Time Taken: 09:12 Temperature (F): 97.9 Height (in): 68 Pulse (bpm): 60 Source: Stated Respiratory Rate (breaths/min): 18 Weight (lbs): 161 Blood Pressure (mmHg): 152/77 Source: Stated Capillary Blood Glucose (mg/dl): 158 Body Mass Index (BMI): 24.5 Reference Range: 80 - 120 mg / dl Electronic Signature(s) Signed: 12/21/2021 8:14:39 AM By: Sharyn Creamer RN, [name] Entered By: Sharyn Creamer on 12/07/2021 09:15:21

## 2021-12-22 DIAGNOSIS — I129 Hypertensive chronic kidney disease with stage 1 through stage 4 chronic kidney disease, or unspecified chronic kidney disease: Secondary | ICD-10-CM | POA: Diagnosis not present

## 2021-12-22 DIAGNOSIS — D631 Anemia in chronic kidney disease: Secondary | ICD-10-CM | POA: Diagnosis not present

## 2021-12-22 DIAGNOSIS — M898X9 Other specified disorders of bone, unspecified site: Secondary | ICD-10-CM | POA: Diagnosis not present

## 2021-12-22 DIAGNOSIS — N184 Chronic kidney disease, stage 4 (severe): Secondary | ICD-10-CM | POA: Diagnosis not present

## 2021-12-22 DIAGNOSIS — E559 Vitamin D deficiency, unspecified: Secondary | ICD-10-CM | POA: Diagnosis not present

## 2021-12-22 NOTE — Progress Notes (Signed)
BOE, DEANS ([id number]) Visit Report for 12/21/2021 Arrival Information Details Patient Name: Date of Service: KAGE, WILLMANN 12/21/2021 1:30 PM Medical Record Number: [id number] Patient Account Number: [id number] Date of Birth/Sex: Treating RN: 1932/05/16 (88 y.o. Ernestene Mention Primary Care Malakai Schoenherr: [name] Other Clinician: Referring Ashutosh Dieguez: Treating Kahlee Metivier/Extender: Earnest Conroy Weeks in Treatment: 2 Visit Information History Since Last Visit Added or deleted any medications: No Patient Arrived: Kasandra Knudsen Any new allergies or adverse reactions: No Arrival Time: 13:41 Had a fall or experienced change in No Accompanied By: daughter activities of daily living that may affect Transfer Assistance: None risk of falls: Patient Identification Verified: Yes Signs or symptoms of abuse/neglect since last visito No Secondary Verification Process Completed: Yes Hospitalized since last visit: No Patient Requires Transmission-Based Precautions: No Implantable device outside of the clinic excluding No Patient Has Alerts: Yes cellular tissue based products placed in the center Patient Alerts: Patient on Blood Thinner since last visit: Has Dressing in Place as Prescribed: Yes Has Compression in Place as Prescribed: Yes Pain Present Now: No Electronic Signature(s) Signed: 12/21/2021 6:03:00 PM By: Baruch Gouty RN, [name] Entered By: Baruch Gouty on 12/21/2021 14:19:55 -------------------------------------------------------------------------------- Compression Therapy Details Patient Name: Date of Service: [name] 12/21/2021 1:30 PM Medical Record Number: [id number] Patient Account Number: [id number] Date of Birth/Sex: Treating RN: [birth year] (89 y.o. Ernestene Mention Primary Care Shawnia Vizcarrondo: [name] Other Clinician: Referring Rainey Rodger: Treating Otila Starn/Extender: Earnest Conroy Weeks in Treatment:  2 Compression Therapy Performed for Wound Assessment: Wound #1 Right,Anterior Lower Leg Performed By: Clinician Baruch Gouty, RN Compression Type: Three Layer Post Procedure Diagnosis Same as Pre-procedure Electronic Signature(s) Signed: 12/21/2021 6:03:00 PM By: Baruch Gouty RN, [name] Entered By: Baruch Gouty on 12/21/2021 14:03:58 -------------------------------------------------------------------------------- Encounter Discharge Information Details Patient Name: Date of Service: [name] 12/21/2021 1:30 PM Medical Record Number: [id number] Patient Account Number: [id number] Date of Birth/Sex: Treating RN: 02-21-1932 (89 y.o. Ernestene Mention Primary Care Senaida Chilcote: [name] Other Clinician: Referring Malasha Kleppe: Treating Christpoher Sievers/Extender: Earnest Conroy Weeks in Treatment: 2 Encounter Discharge Information Items Discharge Condition: Stable Ambulatory Status: Cane Discharge Destination: Home Transportation: Private Auto Accompanied By: daughter Schedule Follow-up Appointment: Yes Clinical Summary of Care: Patient Declined Electronic Signature(s) Signed: 12/21/2021 6:03:00 PM By: Baruch Gouty RN, [name] Entered By: Baruch Gouty on 12/21/2021 14:19:43 -------------------------------------------------------------------------------- Lower Extremity Assessment Details Patient Name: Date of Service: DUONG, HAYDEL 12/21/2021 1:30 PM Medical Record Number: [id number] Patient Account Number: [id number] Date of Birth/Sex: Treating RN: 1932-10-16 (88 y.o. Ernestene Mention Primary Care Javionna Leder: [name] Other Clinician: Referring Deyvi Bonanno: Treating Rayvon Brandvold/Extender: Earnest Conroy Weeks in Treatment: 2 Edema Assessment Assessed: Shirlyn Goltz: No] Patrice Paradise: No] E[Left: dema] [Right: :] Calf Left: Right: Point of Measurement: 33 cm From Medial Instep 27.5 cm Ankle Left: Right: Point of Measurement: 11 cm  From Medial Instep 21.5 cm Vascular Assessment Pulses: Dorsalis Pedis Palpable: [Right:No] Electronic Signature(s) Signed: 12/21/2021 6:03:00 PM By: Baruch Gouty RN, [name] Entered By: Baruch Gouty on 12/21/2021 13:51:50 -------------------------------------------------------------------------------- Multi Wound Chart Details Patient Name: Date of Service: [name] 12/21/2021 1:30 PM Medical Record Number: [id number] Patient Account Number: [id number] Date of Birth/Sex: Treating RN: [birth year] (89 y.o. Ernestene Mention Primary Care Zareya Tuckett: [name] Other Clinician: Referring Ashlynne Shetterly: Treating Yarianna Varble/Extender: Earnest Conroy Weeks in Treatment: 2 Vital Signs Height(in): 68 Capillary Blood Glucose(mg/dl): 154 Weight(lbs): 161 Pulse(bpm): 61 Body Mass Index(BMI): 24.5 Blood  Pressure(mmHg): 135/73 Temperature(F): 98.3 Respiratory Rate(breaths/min): 18 Photos: [N/A:N/A] Right, Anterior Lower Leg N/A N/A Wound Location: Trauma N/A N/A Wounding Event: Venous Leg Ulcer N/A N/A Primary Etiology: Anemia, Arrhythmia, Congestive Heart N/A N/A Comorbid History: Failure, Coronary Artery Disease, Hypertension, Myocardial Infarction, Type II Diabetes, Gout, Neuropathy 11/18/2021 N/A N/A Date Acquired: 2 N/A N/A Weeks of Treatment: Open N/A N/A Wound Status: No N/A N/A Wound Recurrence: 5.8x1.5x0.2 N/A N/A Measurements L x W x D (cm) 6.833 N/A N/A A (cm) : rea 1.367 N/A N/A Volume (cm) : 62.80% N/A N/A % Reduction in Area: 25.60% N/A N/A % Reduction in Volume: Full Thickness Without Exposed N/A N/A Classification: Support Structures Medium N/A N/A Exudate Amount: Serosanguineous N/A N/A Exudate Type: red, brown N/A N/A Exudate Color: Flat and Intact N/A N/A Wound Margin: Medium (34-66%) N/A N/A Granulation Amount: Red N/A N/A Granulation Quality: Medium (34-66%) N/A N/A Necrotic Amount: Fat Layer  (Subcutaneous Tissue): Yes N/A N/A Exposed Structures: Fascia: No Tendon: No Muscle: No Joint: No Bone: No Small (1-33%) N/A N/A Epithelialization: Compression Therapy N/A N/A Procedures Performed: Treatment Notes Electronic Signature(s) Signed: 12/21/2021 5:02:53 PM By: [name] MD Signed: 12/21/2021 6:03:00 PM By: Baruch Gouty RN, [name] Entered By: [name] on 12/21/2021 14:06:49 -------------------------------------------------------------------------------- Multi-Disciplinary Care Plan Details Patient Name: Date of Service: [name] 12/21/2021 1:30 PM Medical Record Number: [id number] Patient Account Number: [id number] Date of Birth/Sex: Treating RN: 03-25-1932 (88 y.o. Ernestene Mention Primary Care Laurence Crofford: [name] Other Clinician: Referring Ellis Koffler: Treating Willean Schurman/Extender: Veneta Penton in Treatment: 2 Multidisciplinary Care Plan reviewed with physician Active Inactive Abuse / Safety / Falls / Self Care Management Nursing Diagnoses: History of Falls Potential for injury related to falls Goals: Patient will not experience any injury related to falls Date Initiated: 12/07/2021 Target Resolution Date: 01/07/2022 Goal Status: Active Patient/caregiver will verbalize/demonstrate measures taken to prevent injury and/or falls Date Initiated: 12/07/2021 Target Resolution Date: 01/07/2022 Goal Status: Active Interventions: Assess Activities of Daily Living upon admission and as needed Assess fall risk on admission and as needed Assess: immobility, friction, shearing, incontinence upon admission and as needed Assess impairment of mobility on admission and as needed per policy Assess personal safety and home safety (as indicated) on admission and as needed Assess self care needs on admission and as needed Provide education on fall prevention Provide education on personal and home  safety Notes: Nutrition Nursing Diagnoses: Impaired glucose control: actual or potential Potential for alteratiion in Nutrition/Potential for imbalanced nutrition Goals: Patient/caregiver agrees to and verbalizes understanding of need to use nutritional supplements and/or vitamins as prescribed Date Initiated: 12/07/2021 Target Resolution Date: 01/07/2022 Goal Status: Active Patient/caregiver will maintain therapeutic glucose control Date Initiated: 12/07/2021 Target Resolution Date: 01/07/2022 Goal Status: Active Interventions: Assess HgA1c results as ordered upon admission and as needed Assess patient nutrition upon admission and as needed per policy Provide education on elevated blood sugars and impact on wound healing Provide education on nutrition Treatment Activities: Education provided on Nutrition : 12/07/2021 Notes: Wound/Skin Impairment Nursing Diagnoses: Impaired tissue integrity Knowledge deficit related to ulceration/compromised skin integrity Goals: Patient/caregiver will verbalize understanding of skin care regimen Date Initiated: 12/07/2021 Target Resolution Date: 01/07/2022 Goal Status: Active Ulcer/skin breakdown will have a volume reduction of 30% by week 4 Date Initiated: 12/07/2021 Target Resolution Date: 01/07/2022 Goal Status: Active Interventions: Assess patient/caregiver ability to obtain necessary supplies Assess patient/caregiver ability to perform ulcer/skin care regimen upon admission and as needed Assess ulceration(s) every visit  Provide education on ulcer and skin care Notes: Electronic Signature(s) Signed: 12/21/2021 6:03:00 PM By: Baruch Gouty RN, [name] Entered By: Baruch Gouty on 12/21/2021 14:03:11 -------------------------------------------------------------------------------- Pain Assessment Details Patient Name: Date of Service: STIVEN, KASPAR 12/21/2021 1:30 PM Medical Record Number: [id number] Patient Account Number: [id number] Date  of Birth/Sex: Treating RN: [birth year] (88 y.o. Ernestene Mention Primary Care Elleigh Cassetta: [name] Other Clinician: Referring Kaymarie Wynn: Treating Noelani Harbach/Extender: Earnest Conroy Weeks in Treatment: 2 Active Problems Location of Pain Severity and Description of Pain Patient Has Paino No Site Locations Rate the pain. Current Pain Level: 0 Pain Management and Medication Current Pain Management: Electronic Signature(s) Signed: 12/21/2021 6:03:00 PM By: Baruch Gouty RN, [name] Entered By: Baruch Gouty on 12/21/2021 13:43:24 -------------------------------------------------------------------------------- Patient/Caregiver Education Details Patient Name: Date of Service: [name] 2/7/2023andnbsp1:30 PM Medical Record Number: [id number] Patient Account Number: [id number] Date of Birth/Gender: Treating RN: Jul 06, 1932 (89 y.o. Ernestene Mention Primary Care Physician: [name] Other Clinician: Referring Physician: Treating Physician/Extender: Veneta Penton in Treatment: 2 Education Assessment Education Provided To: Patient Education Topics Provided Venous: Methods: Explain/Verbal Responses: Reinforcements needed, State content correctly Wound/Skin Impairment: Methods: Explain/Verbal Responses: Reinforcements needed, State content correctly Electronic Signature(s) Signed: 12/21/2021 6:03:00 PM By: Baruch Gouty RN, [name] Entered By: Baruch Gouty on 12/21/2021 14:03:36 -------------------------------------------------------------------------------- Wound Assessment Details Patient Name: Date of Service: [name] 12/21/2021 1:30 PM Medical Record Number: [id number] Patient Account Number: [id number] Date of Birth/Sex: Treating RN: [birth year] (88 y.o. Ernestene Mention Primary Care Taiylor Virden: [name] Other Clinician: Referring Zyon Rosser: Treating Cletus Paris/Extender: Earnest Conroy Weeks in Treatment: 2 Wound Status Wound Number: 1 Primary Venous Leg Ulcer Etiology: Wound Location: Right, Anterior Lower Leg Wound Open Wounding Event: Trauma Status: Date Acquired: 11/18/2021 Comorbid Anemia, Arrhythmia, Congestive Heart Failure, Coronary Artery Weeks Of Treatment: 2 History: Disease, Hypertension, Myocardial Infarction, Type II Diabetes, Clustered Wound: No Gout, Neuropathy Photos Wound Measurements Length: (cm) 5.8 Width: (cm) 1.5 Depth: (cm) 0.2 Area: (cm) 6.833 Volume: (cm) 1.367 % Reduction in Area: 62.8% % Reduction in Volume: 25.6% Epithelialization: Small (1-33%) Tunneling: No Undermining: No Wound Description Classification: Full Thickness Without Exposed Support Structures Wound Margin: Flat and Intact Exudate Amount: Medium Exudate Type: Serosanguineous Exudate Color: red, brown Foul Odor After Cleansing: No Slough/Fibrino Yes Wound Bed Granulation Amount: Medium (34-66%) Exposed Structure Granulation Quality: Red Fascia Exposed: No Necrotic Amount: Medium (34-66%) Fat Layer (Subcutaneous Tissue) Exposed: Yes Necrotic Quality: Adherent Slough Tendon Exposed: No Muscle Exposed: No Joint Exposed: No Bone Exposed: No Treatment Notes Wound #1 (Lower Leg) Wound Laterality: Right, Anterior Cleanser Soap and Water Discharge Instruction: May shower and wash wound with dial antibacterial soap and water prior to dressing change. Wound Cleanser Discharge Instruction: Cleanse the wound with wound cleanser prior to applying a clean dressing using gauze sponges, not tissue or cotton balls. Peri-Wound Care Triamcinolone 15 (g) Discharge Instruction: Use triamcinolone 15 (g) as directed Sween Lotion (Moisturizing lotion) Discharge Instruction: Apply moisturizing lotion as directed Topical Primary Dressing Hydrofera Blue Classic Foam, 2x2 in Discharge Instruction: Moisten with saline prior to applying to wound  bed Secondary Dressing Woven Gauze Sponge, Non-Sterile 4x4 in Discharge Instruction: Apply over primary dressing as directed. ABD Pad, 8x10 Discharge Instruction: Apply over primary dressing as directed. Secured With Compression Wrap ThreePress (3 layer compression wrap) Discharge Instruction: Apply three layer compression as directed. Compression Stockings Add-Ons Electronic Signature(s) Signed: 12/21/2021 6:03:00 PM By: Baruch Gouty RN, [name]  Signed: 12/22/2021 2:07:25 PM By: Sandre Kitty Entered By: Sandre Kitty on 12/21/2021 13:56:45 -------------------------------------------------------------------------------- Vitals Details Patient Name: Date of Service: [name] 12/21/2021 1:30 PM Medical Record Number: [id number] Patient Account Number: [id number] Date of Birth/Sex: Treating RN: September 30, 1932 (88 y.o. Ernestene Mention Primary Care Randolf Sansoucie: [name] Other Clinician: Referring Raihana Balderrama: Treating Bralon Antkowiak/Extender: Earnest Conroy Weeks in Treatment: 2 Vital Signs Time Taken: 13:42 Temperature (F): 98.3 Height (in): 68 Pulse (bpm): 61 Source: Stated Respiratory Rate (breaths/min): 18 Weight (lbs): 161 Blood Pressure (mmHg): 135/73 Source: Stated Capillary Blood Glucose (mg/dl): 154 Body Mass Index (BMI): 24.5 Reference Range: 80 - 120 mg / dl Notes glucose per pt report this am Electronic Signature(s) Signed: 12/21/2021 6:03:00 PM By: Baruch Gouty RN, [name] Entered By: Baruch Gouty on 12/21/2021 13:43:14

## 2021-12-24 ENCOUNTER — Other Ambulatory Visit (HOSPITAL_COMMUNITY): Payer: Self-pay | Admitting: Cardiovascular Disease

## 2021-12-24 DIAGNOSIS — I6523 Occlusion and stenosis of bilateral carotid arteries: Secondary | ICD-10-CM

## 2021-12-24 DIAGNOSIS — Z9889 Other specified postprocedural states: Secondary | ICD-10-CM

## 2021-12-28 ENCOUNTER — Other Ambulatory Visit: Payer: Self-pay

## 2021-12-28 ENCOUNTER — Encounter (HOSPITAL_BASED_OUTPATIENT_CLINIC_OR_DEPARTMENT_OTHER): Payer: Medicare Other | Admitting: Internal Medicine

## 2021-12-28 ENCOUNTER — Ambulatory Visit (HOSPITAL_COMMUNITY)
Admission: RE | Admit: 2021-12-28 | Discharge: 2021-12-28 | Disposition: A | Discharge status: Home / Self Care | Payer: Medicare Other | Source: Ambulatory Visit | Attending: Cardiology | Admitting: Cardiology

## 2021-12-28 DIAGNOSIS — Z9889 Other specified postprocedural states: Secondary | ICD-10-CM | POA: Diagnosis not present

## 2021-12-28 DIAGNOSIS — E1142 Type 2 diabetes mellitus with diabetic polyneuropathy: Secondary | ICD-10-CM | POA: Diagnosis not present

## 2021-12-28 DIAGNOSIS — I6523 Occlusion and stenosis of bilateral carotid arteries: Secondary | ICD-10-CM | POA: Diagnosis not present

## 2021-12-28 DIAGNOSIS — E1122 Type 2 diabetes mellitus with diabetic chronic kidney disease: Secondary | ICD-10-CM | POA: Diagnosis not present

## 2021-12-28 DIAGNOSIS — T8133XA Disruption of traumatic injury wound repair, initial encounter: Secondary | ICD-10-CM | POA: Diagnosis not present

## 2021-12-28 DIAGNOSIS — I87331 Chronic venous hypertension (idiopathic) with ulcer and inflammation of right lower extremity: Secondary | ICD-10-CM | POA: Diagnosis not present

## 2021-12-28 DIAGNOSIS — I872 Venous insufficiency (chronic) (peripheral): Secondary | ICD-10-CM | POA: Diagnosis not present

## 2021-12-28 DIAGNOSIS — I130 Hypertensive heart and chronic kidney disease with heart failure and stage 1 through stage 4 chronic kidney disease, or unspecified chronic kidney disease: Secondary | ICD-10-CM | POA: Diagnosis not present

## 2021-12-28 DIAGNOSIS — L97812 Non-pressure chronic ulcer of other part of right lower leg with fat layer exposed: Secondary | ICD-10-CM | POA: Diagnosis not present

## 2021-12-28 MED ORDER — AMOXICILLIN 500 MG PO TABS: 2000.0000 mg | ORAL_TABLET | Freq: Once | ORAL | 5 refills | Status: AC

## 2021-12-28 NOTE — Progress Notes (Signed)
RAVIS, HERNE ([id number]) Visit Report for 12/28/2021 Debridement Details Patient Name: Date of Service: [name], [name] 12/28/2021 1:45 PM Medical Record Number: [id number] Patient Account Number: [id number] Date of Birth/Sex: Treating RN: Feb 08, 1932 (88 y.o. [name], Vaughan Basta Primary Care Provider: [name] Other Clinician: Referring Provider: Treating Provider/Extender: Earnest Conroy Weeks in Treatment: 3 Debridement Performed for Assessment: Wound #1 Right,Anterior Lower Leg Performed By: Physician [name], MD Debridement Type: Debridement Severity of Tissue Pre Debridement: Fat layer exposed Level of Consciousness (Pre-procedure): Awake and Alert Pre-procedure Verification/Time Out Yes - 14:14 Taken: Start Time: 14:14 Pain Control: Other : Benzocaine 20% T Area Debrided (L x W): otal 5.3 (cm) x 1.3 (cm) = 6.89 (cm) Tissue and other material debrided: Non-Viable, Slough, Subcutaneous, Slough Level: Skin/Subcutaneous Tissue Debridement Description: Excisional Instrument: Curette Bleeding: Minimum Hemostasis Achieved: Pressure End Time: 14:16 Procedural Pain: 0 Post Procedural Pain: 1 Response to Treatment: Procedure was tolerated well Level of Consciousness (Post- Awake and Alert procedure): Post Debridement Measurements of Total Wound Length: (cm) 5.3 Width: (cm) 1.3 Depth: (cm) 0.2 Volume: (cm) 1.082 Character of Wound/Ulcer Post Debridement: Improved Severity of Tissue Post Debridement: Fat layer exposed Post Procedure Diagnosis Same as Pre-procedure Electronic Signature(s) Signed: 12/28/2021 4:15:21 PM By: [name] MD Signed: 12/28/2021 5:35:45 PM By: Baruch Gouty RN, [name] Entered By: [name] on 12/28/2021 14:20:25 -------------------------------------------------------------------------------- HPI Details Patient Name: Date of Service: Jeremiah Bang. 12/28/2021 1:45 PM Medical Record Number:  [id number] Patient Account Number: [id number] Date of Birth/Sex: Treating RN: [birth year] (88 y.o. [name] Primary Care Provider: [name] Other Clinician: Referring Provider: Treating Provider/Extender: [name], [name] Weeks in Treatment: 3 History of Present Illness HPI Description: ADMISSION 12/07/2021 This is a pleasant 88 year old man who arrives in clinic accompanied by his daughter for review of a traumatic wound on the right leg that happened on 11/18/2021. This was a result of a fall he had at home. He was seen in the ER on the same day and had 7 sutures placed. Ultimately the sutures were removed by the family physician on 1/16 however the wound dehisced. They stated that it was hard to get the original dressing off that he had not changed as well. It was felt to be infected. He was given Rocephin and subsequently doxycycline which he finished yesterday. A culture at that time showed methicillin sensitive staph aureus. He comes in the clinic today with eschar over the entire wound surface. There is erythema around the wound but he says he had some of that even before this happened. He quite clearly has chronic venous insufficiency bilaterally. Past medical history includes basal cell CA of the skin, hypertension, chronic kidney disease, heart failure with preserved ejection fraction, coronary artery disease, carotid stenosis, atrial fib on Eliquis, history of vein stripping in the right leg, type 2 diabetes on insulin and mitral valve regurgitation ABI in our clinic was 0.92 on the right 1/31; wound looks better this week smaller still requiring debridement. We will use silver alginate under compression 2/7; wound looks better no debridement is required I changed him to [facility] today still under compression 2/14; continued nice improvement Hydrofera Blue under compression Electronic Signature(s) Signed: 12/28/2021 4:15:21 PM By: [name] MD Entered By: [name] on 12/28/2021 14:20:58 -------------------------------------------------------------------------------- Physical Exam Details Patient Name: Date of Service: Jeremiah Bang. 12/28/2021 1:45 PM Medical Record Number: [id number] Patient Account Number: [id number] Date of Birth/Sex: Treating RN: [birth year] (88  y.o. [name] Primary Care Provider: [name] Other Clinician: Referring Provider: Treating Provider/Extender: Earnest Conroy Weeks in Treatment: 3 Constitutional Sitting or standing Blood Pressure is within target range for patient.. Pulse regular and within target range for patient.[name] Respirations regular, non-labored and within target range.. Temperature is normal and within the target range for the patient.[name] Appears in no distress. Notes .Wound exam; right anterior lower tibia under illumination a fair amount of nonviable tissue on the surface I used a #3 curette to remove this into subcutaneous tissue. This cleans up quite nicely. Also some eschar around some aspects of the circumference. There is no evidence of surrounding infection wound depth of 2 mm Electronic Signature(s) Signed: 12/28/2021 4:15:21 PM By: [name] MD Entered By: [name] on 12/28/2021 14:22:01 -------------------------------------------------------------------------------- Physician Orders Details Patient Name: Date of Service: Jeremiah Bang. 12/28/2021 1:45 PM Medical Record Number: [id number] Patient Account Number: [id number] Date of Birth/Sex: Treating RN: 23-Aug-1932 (88 y.o. [name] Primary Care Provider: [name] Other Clinician: Referring Provider: Treating Provider/Extender: Earnest Conroy Weeks in Treatment: 3 Verbal / Phone Orders: No Diagnosis Coding Follow-up Appointments ppointment in 1 week. - Dr. Dellia Nims Return A Bathing/ Shower/ Hygiene May shower  with protection but do not get wound dressing(s) wet. - Ok to use Market researcher, can purchase at [facility], [facility], or [employer] Edema Control - Lymphedema / SCD / Other Elevate legs to the level of the heart or above for 30 minutes daily and/or when sitting, a frequency of: - throughout the day Avoid standing for long periods of time. Exercise regularly Moisturize legs daily. - left leg Wound Treatment Wound #1 - Lower Leg Wound Laterality: Right, Anterior Cleanser: Soap and Water 1 x Per Week Discharge Instructions: May shower and wash wound with dial antibacterial soap and water prior to dressing change. Cleanser: Wound Cleanser 1 x Per Week Discharge Instructions: Cleanse the wound with wound cleanser prior to applying a clean dressing using gauze sponges, not tissue or cotton balls. Peri-Wound Care: Triamcinolone 15 (g) 1 x Per Week Discharge Instructions: Use triamcinolone 15 (g) as directed Peri-Wound Care: Sween Lotion (Moisturizing lotion) 1 x Per Week Discharge Instructions: Apply moisturizing lotion as directed Prim Dressing: Hydrofera Blue Classic Foam, 2x2 in 1 x Per Week ary Discharge Instructions: Moisten with saline prior to applying to wound bed Secondary Dressing: Woven Gauze Sponge, Non-Sterile 4x4 in 1 x Per Week Discharge Instructions: Apply over primary dressing as directed. Secondary Dressing: ABD Pad, 8x10 1 x Per Week Discharge Instructions: Apply over primary dressing as directed. Compression Wrap: ThreePress (3 layer compression wrap) 1 x Per Week Discharge Instructions: Apply three layer compression as directed. Electronic Signature(s) Signed: 12/28/2021 3:28:53 PM By: Dellie Catholic RN Signed: 12/28/2021 4:15:21 PM By: [name] MD Entered By: Dellie Catholic on 12/28/2021 14:20:29 -------------------------------------------------------------------------------- Problem List Details Patient Name: Date of Service: [name], [name] 12/28/2021 1:45 PM Medical  Record Number: [id number] Patient Account Number: [id number] Date of Birth/Sex: Treating RN: May 03, 1932 (88 y.o. [name] Primary Care Provider: [name] Other Clinician: Referring Provider: Treating Provider/Extender: Earnest Conroy Weeks in Treatment: 3 Active Problems ICD-10 Encounter Code Description Active Date MDM Diagnosis S81.811D Laceration without foreign body, right lower leg, subsequent encounter 12/07/2021 No Yes L97.818 Non-pressure chronic ulcer of other part of right lower leg with other specified 12/07/2021 No Yes severity I87.321 Chronic venous hypertension (idiopathic) with inflammation of right lower 12/07/2021 No  Yes extremity E11.42 Type 2 diabetes mellitus with diabetic polyneuropathy 12/07/2021 No Yes Inactive Problems Resolved Problems Electronic Signature(s) Signed: 12/28/2021 4:15:21 PM By: [name] MD Entered By: [name] on 12/28/2021 14:19:59 -------------------------------------------------------------------------------- Progress Note Details Patient Name: Date of Service: Jeremiah Bang. 12/28/2021 1:45 PM Medical Record Number: [id number] Patient Account Number: [id number] Date of Birth/Sex: Treating RN: [birth year] (88 y.o. [name] Primary Care Provider: [name] Other Clinician: Referring Provider: Treating Provider/Extender: [name], [name] Weeks in Treatment: 3 Subjective History of Present Illness (HPI) ADMISSION 12/07/2021 This is a pleasant 88 year old man who arrives in clinic accompanied by his daughter for review of a traumatic wound on the right leg that happened on 11/18/2021. This was a result of a fall he had at home. He was seen in the ER on the same day and had 7 sutures placed. Ultimately the sutures were removed by the family physician on 1/16 however the wound dehisced. They stated that it was hard to get the original dressing off that he  had not changed as well. It was felt to be infected. He was given Rocephin and subsequently doxycycline which he finished yesterday. A culture at that time showed methicillin sensitive staph aureus. He comes in the clinic today with eschar over the entire wound surface. There is erythema around the wound but he says he had some of that even before this happened. He quite clearly has chronic venous insufficiency bilaterally. Past medical history includes basal cell CA of the skin, hypertension, chronic kidney disease, heart failure with preserved ejection fraction, coronary artery disease, carotid stenosis, atrial fib on Eliquis, history of vein stripping in the right leg, type 2 diabetes on insulin and mitral valve regurgitation ABI in our clinic was 0.92 on the right 1/31; wound looks better this week smaller still requiring debridement. We will use silver alginate under compression 2/7; wound looks better no debridement is required I changed him to [facility] today still under compression 2/14; continued nice improvement Hydrofera Blue under compression Objective Constitutional Sitting or standing Blood Pressure is within target range for patient.. Pulse regular and within target range for patient.[name] Respirations regular, non-labored and within target range.. Temperature is normal and within the target range for the patient.[name] Appears in no distress. Vitals Time Taken: 1:44 PM, Height: 68 in, Weight: 161 lbs, BMI: 24.5, Temperature: 98.2 F, Pulse: 79 bpm, Respiratory Rate: 16 breaths/min, Blood Pressure: 135/70 mmHg, Capillary Blood Glucose: 163 mg/dl. General Notes: glucose per pt report General Notes: .Wound exam; right anterior lower tibia under illumination a fair amount of nonviable tissue on the surface I used a #3 curette to remove this into subcutaneous tissue. This cleans up quite nicely. Also some eschar around some aspects of the circumference. There is no evidence of  surrounding infection wound depth of 2 mm Integumentary (Hair, Skin) Wound #1 status is Open. Original cause of wound was Trauma. The date acquired was: 11/18/2021. The wound has been in treatment 3 weeks. The wound is located on the Right,Anterior Lower Leg. The wound measures 5.3cm length x 1.3cm width x 0.2cm depth; 5.411cm^2 area and 1.082cm^3 volume. There is Fat Layer (Subcutaneous Tissue) exposed. There is no tunneling or undermining noted. There is a medium amount of serosanguineous drainage noted. The wound margin is flat and intact. There is medium (34-66%) red granulation within the wound bed. There is a medium (34-66%) amount of necrotic tissue within the wound bed including Adherent Slough. Assessment Active Problems ICD-10  Laceration without foreign body, right lower leg, subsequent encounter Non-pressure chronic ulcer of other part of right lower leg with other specified severity Chronic venous hypertension (idiopathic) with inflammation of right lower extremity Type 2 diabetes mellitus with diabetic polyneuropathy Procedures Wound #1 Pre-procedure diagnosis of Wound #1 is a Venous Leg Ulcer located on the Right,Anterior Lower Leg .Severity of Tissue Pre Debridement is: Fat layer exposed. There was a Excisional Skin/Subcutaneous Tissue Debridement with a total area of 6.89 sq cm performed by [name], MD. With the following instrument(s): Curette to remove Non-Viable tissue/material. Material removed includes Subcutaneous Tissue and Slough and after achieving pain control using Other (Benzocaine 20%). No specimens were taken. A time out was conducted at 14:14, prior to the start of the procedure. A Minimum amount of bleeding was controlled with Pressure. The procedure was tolerated well with a pain level of 0 throughout and a pain level of 1 following the procedure. Post Debridement Measurements: 5.3cm length x 1.3cm width x 0.2cm depth; 1.082cm^3 volume. Character of  Wound/Ulcer Post Debridement is improved. Severity of Tissue Post Debridement is: Fat layer exposed. Post procedure Diagnosis Wound #1: Same as Pre-Procedure Plan Follow-up Appointments: Return Appointment in 1 week. - Dr. Dellia Nims Bathing/ Shower/ Hygiene: May shower with protection but do not get wound dressing(s) wet. - Ok to use Market researcher, can purchase at [facility], [facility], or [employer] Edema Control - Lymphedema / SCD / Other: Elevate legs to the level of the heart or above for 30 minutes daily and/or when sitting, a frequency of: - throughout the day Avoid standing for long periods of time. Exercise regularly Moisturize legs daily. - left leg WOUND #1: - Lower Leg Wound Laterality: Right, Anterior Cleanser: Soap and Water 1 x Per Week/ Discharge Instructions: May shower and wash wound with dial antibacterial soap and water prior to dressing change. Cleanser: Wound Cleanser 1 x Per Week/ Discharge Instructions: Cleanse the wound with wound cleanser prior to applying a clean dressing using gauze sponges, not tissue or cotton balls. Peri-Wound Care: Triamcinolone 15 (g) 1 x Per Week/ Discharge Instructions: Use triamcinolone 15 (g) as directed Peri-Wound Care: Sween Lotion (Moisturizing lotion) 1 x Per Week/ Discharge Instructions: Apply moisturizing lotion as directed Prim Dressing: Hydrofera Blue Classic Foam, 2x2 in 1 x Per Week/ ary Discharge Instructions: Moisten with saline prior to applying to wound bed Secondary Dressing: Woven Gauze Sponge, Non-Sterile 4x4 in 1 x Per Week/ Discharge Instructions: Apply over primary dressing as directed. Secondary Dressing: ABD Pad, 8x10 1 x Per Week/ Discharge Instructions: Apply over primary dressing as directed. Com pression Wrap: ThreePress (3 layer compression wrap) 1 x Per Week/ Discharge Instructions: Apply three layer compression as directed. 1. I continued with the Hydrofera Blue. Looking for improvements post debridement today in  surface area 2. Still under 3 layer compression Electronic Signature(s) Signed: 12/28/2021 4:15:21 PM By: [name] MD Entered By: [name] on 12/28/2021 14:23:59 -------------------------------------------------------------------------------- SuperBill Details Patient Name: Date of Service: Jeremiah Bang 12/28/2021 Medical Record Number: [id number] Patient Account Number: [id number] Date of Birth/Sex: Treating RN: [birth year] (88 y.o. [name] Primary Care Provider: [name] Other Clinician: Referring Provider: Treating Provider/Extender: Earnest Conroy Weeks in Treatment: 3 Diagnosis Coding ICD-10 Codes Code Description [phone] Laceration without foreign body, right lower leg, subsequent encounter L97.818 Non-pressure chronic ulcer of other part of right lower leg with other specified severity I87.321 Chronic venous hypertension (idiopathic) with inflammation of right lower extremity E11.42 Type 2  diabetes mellitus with diabetic polyneuropathy Facility Procedures CPT4 Code: [id number] Description: 83662 - DEB SUBQ TISSUE 20 SQ CM/< ICD-10 Diagnosis Description L97.818 Non-pressure chronic ulcer of other part of right lower leg with other specified I87.321 Chronic venous hypertension (idiopathic) with inflammation of right lower extrem Modifier: severity ity Quantity: 1 Physician Procedures : CPT4 Code Description Modifier [id number] - WC PHYS SUBQ TISS 20 SQ CM ICD-10 Diagnosis Description L97.818 Non-pressure chronic ulcer of other part of right lower leg with other specified severity I87.321 Chronic venous hypertension (idiopathic)  with inflammation of right lower extremity Quantity: 1 Electronic Signature(s) Signed: 12/28/2021 4:15:21 PM By: [name] MD Entered By: [name] on 12/28/2021 14:24:19

## 2021-12-28 NOTE — Progress Notes (Signed)
Pt came in to [facility] office today for dopplers, while here pt mentioned to ultrasound tech that he needs antibiotic refills for upcoming dental cleaning [facility]. Amoxicillin 2g prescription sent in per [facility] prophylaxis protocol. Message sent to ultrasound tech that prescription was sent in as requested.

## 2022-01-04 ENCOUNTER — Encounter (HOSPITAL_BASED_OUTPATIENT_CLINIC_OR_DEPARTMENT_OTHER): Payer: Medicare Other | Admitting: Internal Medicine

## 2022-01-04 ENCOUNTER — Other Ambulatory Visit: Payer: Self-pay

## 2022-01-04 DIAGNOSIS — L97812 Non-pressure chronic ulcer of other part of right lower leg with fat layer exposed: Secondary | ICD-10-CM | POA: Diagnosis not present

## 2022-01-04 DIAGNOSIS — I130 Hypertensive heart and chronic kidney disease with heart failure and stage 1 through stage 4 chronic kidney disease, or unspecified chronic kidney disease: Secondary | ICD-10-CM | POA: Diagnosis not present

## 2022-01-04 DIAGNOSIS — T8133XA Disruption of traumatic injury wound repair, initial encounter: Secondary | ICD-10-CM | POA: Diagnosis not present

## 2022-01-04 DIAGNOSIS — E1142 Type 2 diabetes mellitus with diabetic polyneuropathy: Secondary | ICD-10-CM | POA: Diagnosis not present

## 2022-01-04 DIAGNOSIS — I87331 Chronic venous hypertension (idiopathic) with ulcer and inflammation of right lower extremity: Secondary | ICD-10-CM | POA: Diagnosis not present

## 2022-01-04 DIAGNOSIS — I87321 Chronic venous hypertension (idiopathic) with inflammation of right lower extremity: Secondary | ICD-10-CM | POA: Diagnosis not present

## 2022-01-04 DIAGNOSIS — E1122 Type 2 diabetes mellitus with diabetic chronic kidney disease: Secondary | ICD-10-CM | POA: Diagnosis not present

## 2022-01-05 ENCOUNTER — Encounter (HOSPITAL_BASED_OUTPATIENT_CLINIC_OR_DEPARTMENT_OTHER): Payer: Medicare Other | Admitting: Physician Assistant

## 2022-01-05 NOTE — Progress Notes (Signed)
PACEN, WATFORD ([id number]) Visit Report for 01/04/2022 Arrival Information Details Patient Name: Date of Service: [name], [name] 01/04/2022 1:45 PM Medical Record Number: [id number] Patient Account Number: [id number] Date of Birth/Sex: Treating RN: [birth year] (88 y.o. Collene Gobble Primary Care Zacchaeus Halm: [name] Other Clinician: Referring Crystie Yanko: Treating Kyandra Mcclaine/Extender: Earnest Conroy Weeks in Treatment: 4 Visit Information History Since Last Visit Added or deleted any medications: No Patient Arrived: Kasandra Knudsen Any new allergies or adverse reactions: No Arrival Time: 13:53 Had a fall or experienced change in No Accompanied By: daughter activities of daily living that may affect Transfer Assistance: None risk of falls: Patient Identification Verified: Yes Signs or symptoms of abuse/neglect since last visito No Secondary Verification Process Completed: Yes Hospitalized since last visit: No Patient Requires Transmission-Based Precautions: No Implantable device outside of the clinic excluding No Patient Has Alerts: Yes cellular tissue based products placed in the center Patient Alerts: Patient on Blood Thinner since last visit: Has Dressing in Place as Prescribed: Yes Pain Present Now: Yes Electronic Signature(s) Signed: 01/05/2022 8:22:14 AM By: Sandre Kitty Entered By: Sandre Kitty on 01/04/2022 13:53:38 -------------------------------------------------------------------------------- Compression Therapy Details Patient Name: Date of Service: [name], [name] 01/04/2022 1:45 PM Medical Record Number: [id number] Patient Account Number: [id number] Date of Birth/Sex: Treating RN: [birth year] (88 y.o. Collene Gobble Primary Care Zyler Hyson: [name] Other Clinician: Referring Daysi Boggan: Treating Avian Konigsberg/Extender: Earnest Conroy Weeks in Treatment: 4 Compression Therapy Performed for Wound Assessment:  Wound #1 Right,Anterior Lower Leg Performed By: Clinician Dellie Catholic, RN Compression Type: Three Layer Post Procedure Diagnosis Same as Pre-procedure Electronic Signature(s) Signed: 01/04/2022 4:49:00 PM By: Dellie Catholic RN Entered By: Dellie Catholic on 01/04/2022 16:44:21 -------------------------------------------------------------------------------- Encounter Discharge Information Details Patient Name: Date of Service: [name], [name] 01/04/2022 1:45 PM Medical Record Number: [id number] Patient Account Number: [id number] Date of Birth/Sex: Treating RN: May 05, 1932 (88 y.o. Collene Gobble Primary Care Makylah Bossard: [name] Other Clinician: Referring Franklyn Cafaro: Treating Jesilyn Easom/Extender: Earnest Conroy Weeks in Treatment: 4 Encounter Discharge Information Items Discharge Condition: Stable Ambulatory Status: Ambulatory Discharge Destination: Home Transportation: Private Auto Accompanied By: spouse Schedule Follow-up Appointment: Yes Clinical Summary of Care: Patient Declined Electronic Signature(s) Signed: 01/04/2022 4:49:00 PM By: Dellie Catholic RN Entered By: Dellie Catholic on 01/04/2022 16:48:34 -------------------------------------------------------------------------------- Lower Extremity Assessment Details Patient Name: Date of Service: [name], [name] 01/04/2022 1:45 PM Medical Record Number: [id number] Patient Account Number: [id number] Date of Birth/Sex: Treating RN: Sep 29, 1932 (88 y.o. Collene Gobble Primary Care Catalina Salasar: [name] Other Clinician: Referring Tera Pellicane: Treating Maddeline Roorda/Extender: Earnest Conroy Weeks in Treatment: 4 Edema Assessment Assessed: Shirlyn Goltz: No] Patrice Paradise: No] E[Left: dema] [Right: :] Calf Left: Right: Point of Measurement: 33 cm From Medial Instep 28.1 cm Ankle Left: Right: Point of Measurement: 11 cm From Medial Instep 21.4 cm Electronic  Signature(s) Signed: 01/04/2022 4:49:00 PM By: Dellie Catholic RN Entered By: Dellie Catholic on 01/04/2022 14:19:51 -------------------------------------------------------------------------------- Multi Wound Chart Details Patient Name: Date of Service: Jeremiah Bang. 01/04/2022 1:45 PM Medical Record Number: [id number] Patient Account Number: [id number] Date of Birth/Sex: Treating RN: [birth year] (88 y.o. Collene Gobble Primary Care Camyla Camposano: [name] Other Clinician: Referring Lorre Opdahl: Treating Oakleigh Hesketh/Extender: Earnest Conroy Weeks in Treatment: 4 Vital Signs Height(in): 68 Capillary Blood Glucose(mg/dl): 141 Weight(lbs): 161 Pulse(bpm): 97 Body Mass Index(BMI): 24.5 Blood Pressure(mmHg): 136/69 Temperature(F): 98.3 Respiratory Rate(breaths/min): 18 Photos: [N/A:N/A] Right, Anterior Lower Leg N/A N/A Wound Location: Trauma N/A  N/A Wounding Event: Venous Leg Ulcer N/A N/A Primary Etiology: Anemia, Arrhythmia, Congestive Heart N/A N/A Comorbid History: Failure, Coronary Artery Disease, Hypertension, Myocardial Infarction, Type II Diabetes, Gout, Neuropathy 11/18/2021 N/A N/A Date Acquired: 4 N/A N/A Weeks of Treatment: Open N/A N/A Wound Status: No N/A N/A Wound Recurrence: 4.8x0.8x0.2 N/A N/A Measurements L x W x D (cm) 3.016 N/A N/A A (cm) : rea 0.603 N/A N/A Volume (cm) : 83.60% N/A N/A % Reduction in Area: 67.20% N/A N/A % Reduction in Volume: Full Thickness Without Exposed N/A N/A Classification: Support Structures Medium N/A N/A Exudate Amount: Serosanguineous N/A N/A Exudate Type: red, brown N/A N/A Exudate Color: Flat and Intact N/A N/A Wound Margin: Large (67-100%) N/A N/A Granulation Amount: Red N/A N/A Granulation Quality: Small (1-33%) N/A N/A Necrotic Amount: Fat Layer (Subcutaneous Tissue): Yes N/A N/A Exposed Structures: Fascia: No Tendon: No Muscle: No Joint: No Bone: No Small  (1-33%) N/A N/A Epithelialization: Treatment Notes Electronic Signature(s) Signed: 01/04/2022 4:44:59 PM By: [name] MD Signed: 01/04/2022 4:49:00 PM By: Dellie Catholic RN Entered By: [name] on 01/04/2022 14:29:00 -------------------------------------------------------------------------------- Multi-Disciplinary Care Plan Details Patient Name: Date of Service: Jeremiah Bang. 01/04/2022 1:45 PM Medical Record Number: [id number] Patient Account Number: [id number] Date of Birth/Sex: Treating RN: 1931/11/20 (88 y.o. Collene Gobble Primary Care Brandi Tomlinson: [name] Other Clinician: Referring Maziah Keeling: Treating Holt Woolbright/Extender: Veneta Penton in Treatment: 4 Multidisciplinary Care Plan reviewed with physician Active Inactive Abuse / Safety / Falls / Self Care Management Nursing Diagnoses: History of Falls Potential for injury related to falls Goals: Patient will not experience any injury related to falls Date Initiated: 12/07/2021 Target Resolution Date: 02/01/2022 Goal Status: Active Patient/caregiver will verbalize/demonstrate measures taken to prevent injury and/or falls Date Initiated: 12/07/2021 Target Resolution Date: 02/01/2022 Goal Status: Active Interventions: Assess Activities of Daily Living upon admission and as needed Assess fall risk on admission and as needed Assess: immobility, friction, shearing, incontinence upon admission and as needed Assess impairment of mobility on admission and as needed per policy Assess personal safety and home safety (as indicated) on admission and as needed Assess self care needs on admission and as needed Provide education on fall prevention Provide education on personal and home safety Notes: Nutrition Nursing Diagnoses: Impaired glucose control: actual or potential Potential for alteratiion in Nutrition/Potential for imbalanced nutrition Goals: Patient/caregiver agrees to  and verbalizes understanding of need to use nutritional supplements and/or vitamins as prescribed Date Initiated: 12/07/2021 Target Resolution Date: 02/01/2022 Goal Status: Active Patient/caregiver will maintain therapeutic glucose control Date Initiated: 12/07/2021 Target Resolution Date: 02/01/2022 Goal Status: Active Interventions: Assess HgA1c results as ordered upon admission and as needed Assess patient nutrition upon admission and as needed per policy Provide education on elevated blood sugars and impact on wound healing Provide education on nutrition Treatment Activities: Education provided on Nutrition : 12/07/2021 Notes: Wound/Skin Impairment Nursing Diagnoses: Impaired tissue integrity Knowledge deficit related to ulceration/compromised skin integrity Goals: Patient/caregiver will verbalize understanding of skin care regimen Date Initiated: 12/07/2021 Target Resolution Date: 02/01/2022 Goal Status: Active Ulcer/skin breakdown will have a volume reduction of 30% by week 4 Date Initiated: 12/07/2021 Target Resolution Date: 02/01/2022 Goal Status: Active Interventions: Assess patient/caregiver ability to obtain necessary supplies Assess patient/caregiver ability to perform ulcer/skin care regimen upon admission and as needed Assess ulceration(s) every visit Provide education on ulcer and skin care Notes: Electronic Signature(s) Signed: 01/04/2022 4:49:00 PM By: Dellie Catholic RN Entered By: Dellie Catholic on 01/04/2022 14:24:22 --------------------------------------------------------------------------------  Pain Assessment Details Patient Name: Date of Service: [name], [name] 01/04/2022 1:45 PM Medical Record Number: [id number] Patient Account Number: [id number] Date of Birth/Sex: Treating RN: 11-17-1931 (88 y.o. Collene Gobble Primary Care Avaiyah Strubel: [name] Other Clinician: Referring Dreama Kuna: Treating Rudolf Blizard/Extender: Earnest Conroy Weeks in Treatment: 4 Active Problems Location of Pain Severity and Description of Pain Patient Has Paino Yes Site Locations Rate the pain. Current Pain Level: 3 Pain Management and Medication Current Pain Management: Electronic Signature(s) Signed: 01/04/2022 4:49:00 PM By: Dellie Catholic RN Signed: 01/05/2022 8:22:14 AM By: Sandre Kitty Entered By: Sandre Kitty on 01/04/2022 13:54:06 -------------------------------------------------------------------------------- Patient/Caregiver Education Details Patient Name: Date of Service: [name], [name] 2/21/2023andnbsp1:45 PM Medical Record Number: [id number] Patient Account Number: [id number] Date of Birth/Gender: Treating RN: [birth year] (88 y.o. Collene Gobble Primary Care Physician: [name] Other Clinician: Referring Physician: Treating Physician/Extender: Veneta Penton in Treatment: 4 Education Assessment Education Provided To: Patient Education Topics Provided Safety: Methods: Explain/Verbal Responses: Return demonstration correctly Electronic Signature(s) Signed: 01/04/2022 4:49:00 PM By: Dellie Catholic RN Entered By: Dellie Catholic on 01/04/2022 14:24:58 -------------------------------------------------------------------------------- Wound Assessment Details Patient Name: Date of Service: [name], [name] 01/04/2022 1:45 PM Medical Record Number: [id number] Patient Account Number: [id number] Date of Birth/Sex: Treating RN: 24-Apr-1932 (88 y.o. Collene Gobble Primary Care Sephiroth Mcluckie: [name] Other Clinician: Referring Jarvin Ogren: Treating Samyah Bilbo/Extender: Earnest Conroy Weeks in Treatment: 4 Wound Status Wound Number: 1 Primary Venous Leg Ulcer Etiology: Wound Location: Right, Anterior Lower Leg Wound Open Wounding Event: Trauma Status: Date Acquired: 11/18/2021 Comorbid Anemia, Arrhythmia, Congestive Heart  Failure, Coronary Artery Weeks Of Treatment: 4 History: Disease, Hypertension, Myocardial Infarction, Type II Diabetes, Clustered Wound: No Gout, Neuropathy Photos Wound Measurements Length: (cm) 4.8 Width: (cm) 0.8 Depth: (cm) 0.2 Area: (cm) 3.016 Volume: (cm) 0.603 % Reduction in Area: 83.6% % Reduction in Volume: 67.2% Epithelialization: Small (1-33%) Tunneling: No Undermining: No Wound Description Classification: Full Thickness Without Exposed Support Structures Wound Margin: Flat and Intact Exudate Amount: Medium Exudate Type: Serosanguineous Exudate Color: red, brown Foul Odor After Cleansing: No Slough/Fibrino Yes Wound Bed Granulation Amount: Large (67-100%) Exposed Structure Granulation Quality: Red Fascia Exposed: No Necrotic Amount: Small (1-33%) Fat Layer (Subcutaneous Tissue) Exposed: Yes Necrotic Quality: Adherent Slough Tendon Exposed: No Muscle Exposed: No Joint Exposed: No Bone Exposed: No Treatment Notes Wound #1 (Lower Leg) Wound Laterality: Right, Anterior Cleanser Soap and Water Discharge Instruction: May shower and wash wound with dial antibacterial soap and water prior to dressing change. Wound Cleanser Discharge Instruction: Cleanse the wound with wound cleanser prior to applying a clean dressing using gauze sponges, not tissue or cotton balls. Peri-Wound Care Triamcinolone 15 (g) Discharge Instruction: Use triamcinolone 15 (g) as directed Sween Lotion (Moisturizing lotion) Discharge Instruction: Apply moisturizing lotion as directed Topical Primary Dressing Hydrofera Blue Classic Foam, 2x2 in Discharge Instruction: Moisten with saline prior to applying to wound bed Secondary Dressing Woven Gauze Sponge, Non-Sterile 4x4 in Discharge Instruction: Apply over primary dressing as directed. ABD Pad, 8x10 Discharge Instruction: Apply over primary dressing as directed. Secured With Compression Wrap ThreePress (3 layer compression  wrap) Discharge Instruction: Apply three layer compression as directed. Compression Stockings Add-Ons Electronic Signature(s) Signed: 01/04/2022 4:49:00 PM By: Dellie Catholic RN Entered By: Dellie Catholic on 01/04/2022 14:20:56 -------------------------------------------------------------------------------- Vitals Details Patient Name: Date of Service: Jeremiah Bang. 01/04/2022 1:45 PM Medical Record Number: [id number] Patient Account Number: [id number] Date of Birth/Sex: Treating RN: [birth year] (  88 y.o. Collene Gobble Primary Care Nikeya Maxim: [name] Other Clinician: Referring Dillen Belmontes: Treating Karyna Bessler/Extender: Earnest Conroy Weeks in Treatment: 4 Vital Signs Time Taken: 13:53 Temperature (F): 98.3 Height (in): 68 Pulse (bpm): 59 Weight (lbs): 161 Respiratory Rate (breaths/min): 18 Body Mass Index (BMI): 24.5 Blood Pressure (mmHg): 136/69 Capillary Blood Glucose (mg/dl): 141 Reference Range: 80 - 120 mg / dl Electronic Signature(s) Signed: 01/05/2022 8:22:14 AM By: Sandre Kitty Entered By: Sandre Kitty on 01/04/2022 13:53:58

## 2022-01-05 NOTE — Progress Notes (Signed)
RAYLON, LAMSON ([id number]) Visit Report for 01/04/2022 HPI Details Patient Name: Date of Service: [name], [name] 01/04/2022 1:45 PM Medical Record Number: [id number] Patient Account Number: [id number] Date of Birth/Sex: Treating RN: [birth year] (88 y.o. Collene Gobble Primary Care Provider: [name] Other Clinician: Referring Provider: Treating Provider/Extender: [name], [name] Weeks in Treatment: 4 History of Present Illness HPI Description: ADMISSION 12/07/2021 This is a pleasant 88 year old man who arrives in clinic accompanied by his daughter for review of a traumatic wound on the right leg that happened on 11/18/2021. This was a result of a fall he had at home. He was seen in the ER on the same day and had 7 sutures placed. Ultimately the sutures were removed by the family physician on 1/16 however the wound dehisced. They stated that it was hard to get the original dressing off that he had not changed as well. It was felt to be infected. He was given Rocephin and subsequently doxycycline which he finished yesterday. A culture at that time showed methicillin sensitive staph aureus. He comes in the clinic today with eschar over the entire wound surface. There is erythema around the wound but he says he had some of that even before this happened. He quite clearly has chronic venous insufficiency bilaterally. Past medical history includes basal cell CA of the skin, hypertension, chronic kidney disease, heart failure with preserved ejection fraction, coronary artery disease, carotid stenosis, atrial fib on Eliquis, history of vein stripping in the right leg, type 2 diabetes on insulin and mitral valve regurgitation ABI in our clinic was 0.92 on the right 2/21; continued nice improvements in this wound. Healthy granulation using Hydrofera Blue under 3 lack compression. 1/31; wound looks better this week smaller still requiring debridement. We will use silver  alginate under compression 2/7; wound looks better no debridement is required I changed him to [facility] today still under compression 2/14; continued nice improvement Hydrofera Blue under compression Electronic Signature(s) Signed: 01/04/2022 4:44:59 PM By: [name] MD Entered By: [name] on 01/04/2022 14:30:45 -------------------------------------------------------------------------------- Physical Exam Details Patient Name: Date of Service: [name] 01/04/2022 1:45 PM Medical Record Number: [id number] Patient Account Number: [id number] Date of Birth/Sex: Treating RN: 22-Feb-1932 (88 y.o. Collene Gobble Primary Care Provider: [name] Other Clinician: Referring Provider: Treating Provider/Extender: Earnest Conroy Weeks in Treatment: 4 Constitutional . Pulse regular and within target range for patient.[name] Respirations regular, non-labored and within target range.. Temperature is normal and within the target range for the patient.. Cardiovascular Pedal pulses palpable and strong bilaterally.. patient has some degree of chronic venous insufficiency.. Notes wound exam; right anterior lower tibia. Healthy looking wound and healthy looking granulation. No debridement is required today. No evidence of surrounding infection edema control is adequate Electronic Signature(s) Signed: 01/04/2022 4:44:59 PM By: [name] MD Entered By: [name] on 01/04/2022 14:32:46 -------------------------------------------------------------------------------- Physician Orders Details Patient Name: Date of Service: KASSIUS, BATTISTE 01/04/2022 1:45 PM Medical Record Number: [id number] Patient Account Number: [id number] Date of Birth/Sex: Treating RN: November 06, 1932 (88 y.o. Collene Gobble Primary Care Provider: [name] Other Clinician: Referring Provider: Treating Provider/Extender: Earnest Conroy Weeks in Treatment: 4 Verbal / Phone Orders: No Diagnosis Coding Follow-up Appointments ppointment in 1 week. - Dr. Dellia Nims Return A Bathing/ Shower/ Hygiene May shower with protection but do not get wound dressing(s) wet. - Ok to use Market researcher, can purchase at [facility], [facility], or [employer] Edema  Control - Lymphedema / SCD / Other Elevate legs to the level of the heart or above for 30 minutes daily and/or when sitting, a frequency of: - throughout the day Avoid standing for long periods of time. Exercise regularly Moisturize legs daily. - left leg Wound Treatment Wound #1 - Lower Leg Wound Laterality: Right, Anterior Cleanser: Soap and Water 1 x Per Week Discharge Instructions: May shower and wash wound with dial antibacterial soap and water prior to dressing change. Cleanser: Wound Cleanser 1 x Per Week Discharge Instructions: Cleanse the wound with wound cleanser prior to applying a clean dressing using gauze sponges, not tissue or cotton balls. Peri-Wound Care: Triamcinolone 15 (g) 1 x Per Week Discharge Instructions: Use triamcinolone 15 (g) as directed Peri-Wound Care: Sween Lotion (Moisturizing lotion) 1 x Per Week Discharge Instructions: Apply moisturizing lotion as directed Prim Dressing: Hydrofera Blue Classic Foam, 2x2 in 1 x Per Week ary Discharge Instructions: Moisten with saline prior to applying to wound bed Secondary Dressing: Woven Gauze Sponge, Non-Sterile 4x4 in 1 x Per Week Discharge Instructions: Apply over primary dressing as directed. Secondary Dressing: ABD Pad, 8x10 1 x Per Week Discharge Instructions: Apply over primary dressing as directed. Compression Wrap: ThreePress (3 layer compression wrap) 1 x Per Week Discharge Instructions: Apply three layer compression as directed. Electronic Signature(s) Signed: 01/04/2022 4:44:59 PM By: [name] MD Signed: 01/04/2022 4:49:00 PM By: Dellie Catholic RN Entered By: Dellie Catholic on 01/04/2022  14:23:18 -------------------------------------------------------------------------------- Problem List Details Patient Name: Date of Service: PARMINDER, CUPPLES 01/04/2022 1:45 PM Medical Record Number: [id number] Patient Account Number: [id number] Date of Birth/Sex: Treating RN: 27-Jul-1932 (88 y.o. Collene Gobble Primary Care Provider: [name] Other Clinician: Referring Provider: Treating Provider/Extender: [name], [name] Weeks in Treatment: 4 Active Problems ICD-10 Encounter Code Description Active Date MDM Diagnosis S81.811D Laceration without foreign body, right lower leg, subsequent encounter 12/07/2021 No Yes L97.818 Non-pressure chronic ulcer of other part of right lower leg with other specified 12/07/2021 No Yes severity I87.321 Chronic venous hypertension (idiopathic) with inflammation of right lower 12/07/2021 No Yes extremity E11.42 Type 2 diabetes mellitus with diabetic polyneuropathy 12/07/2021 No Yes Inactive Problems Resolved Problems Electronic Signature(s) Signed: 01/04/2022 4:44:59 PM By: [name] MD Entered By: [name] on 01/04/2022 14:28:51 -------------------------------------------------------------------------------- Progress Note Details Patient Name: Date of Service: [name] 01/04/2022 1:45 PM Medical Record Number: [id number] Patient Account Number: [id number] Date of Birth/Sex: Treating RN: 09-28-1932 (88 y.o. Collene Gobble Primary Care Provider: [name] Other Clinician: Referring Provider: Treating Provider/Extender: [name], [name] Weeks in Treatment: 4 Subjective History of Present Illness (HPI) ADMISSION 12/07/2021 This is a pleasant 88 year old man who arrives in clinic accompanied by his daughter for review of a traumatic wound on the right leg that happened on 11/18/2021. This was a result of a fall he had at home. He was seen in the ER on the same  day and had 7 sutures placed. Ultimately the sutures were removed by the family physician on 1/16 however the wound dehisced. They stated that it was hard to get the original dressing off that he had not changed as well. It was felt to be infected. He was given Rocephin and subsequently doxycycline which he finished yesterday. A culture at that time showed methicillin sensitive staph aureus. He comes in the clinic today with eschar over the entire wound surface. There is erythema around the wound but he says he had some of that even  before this happened. He quite clearly has chronic venous insufficiency bilaterally. Past medical history includes basal cell CA of the skin, hypertension, chronic kidney disease, heart failure with preserved ejection fraction, coronary artery disease, carotid stenosis, atrial fib on Eliquis, history of vein stripping in the right leg, type 2 diabetes on insulin and mitral valve regurgitation ABI in our clinic was 0.92 on the right 2/21; continued nice improvements in this wound. Healthy granulation using Hydrofera Blue under 3 lack compression. 1/31; wound looks better this week smaller still requiring debridement. We will use silver alginate under compression 2/7; wound looks better no debridement is required I changed him to [facility] today still under compression 2/14; continued nice improvement Hydrofera Blue under compression Objective Constitutional Pulse regular and within target range for patient.[name] Respirations regular, non-labored and within target range.. Temperature is normal and within the target range for the patient.. Vitals Time Taken: 1:53 PM, Height: 68 in, Weight: 161 lbs, BMI: 24.5, Temperature: 98.3 F, Pulse: 59 bpm, Respiratory Rate: 18 breaths/min, Blood Pressure: 136/69 mmHg, Capillary Blood Glucose: 141 mg/dl. Cardiovascular Pedal pulses palpable and strong bilaterally.. patient has some degree of chronic venous insufficiency.. General  Notes: wound exam; right anterior lower tibia. Healthy looking wound and healthy looking granulation. No debridement is required today. No evidence of surrounding infection edema control is adequate Integumentary (Hair, Skin) Wound #1 status is Open. Original cause of wound was Trauma. The date acquired was: 11/18/2021. The wound has been in treatment 4 weeks. The wound is located on the Right,Anterior Lower Leg. The wound measures 4.8cm length x 0.8cm width x 0.2cm depth; 3.016cm^2 area and 0.603cm^3 volume. There is Fat Layer (Subcutaneous Tissue) exposed. There is no tunneling or undermining noted. There is a medium amount of serosanguineous drainage noted. The wound margin is flat and intact. There is large (67-100%) red granulation within the wound bed. There is a small (1-33%) amount of necrotic tissue within the wound bed including Adherent Slough. Assessment Active Problems ICD-10 Laceration without foreign body, right lower leg, subsequent encounter Non-pressure chronic ulcer of other part of right lower leg with other specified severity Chronic venous hypertension (idiopathic) with inflammation of right lower extremity Type 2 diabetes mellitus with diabetic polyneuropathy Plan Follow-up Appointments: Return Appointment in 1 week. - Dr. Dellia Nims Bathing/ Shower/ Hygiene: May shower with protection but do not get wound dressing(s) wet. - Ok to use Market researcher, can purchase at [facility], [facility], or [employer] Edema Control - Lymphedema / SCD / Other: Elevate legs to the level of the heart or above for 30 minutes daily and/or when sitting, a frequency of: - throughout the day Avoid standing for long periods of time. Exercise regularly Moisturize legs daily. - left leg WOUND #1: - Lower Leg Wound Laterality: Right, Anterior Cleanser: Soap and Water 1 x Per Week/ Discharge Instructions: May shower and wash wound with dial antibacterial soap and water prior to dressing change. Cleanser: Wound  Cleanser 1 x Per Week/ Discharge Instructions: Cleanse the wound with wound cleanser prior to applying a clean dressing using gauze sponges, not tissue or cotton balls. Peri-Wound Care: Triamcinolone 15 (g) 1 x Per Week/ Discharge Instructions: Use triamcinolone 15 (g) as directed Peri-Wound Care: Sween Lotion (Moisturizing lotion) 1 x Per Week/ Discharge Instructions: Apply moisturizing lotion as directed Prim Dressing: Hydrofera Blue Classic Foam, 2x2 in 1 x Per Week/ ary Discharge Instructions: Moisten with saline prior to applying to wound bed Secondary Dressing: Woven Gauze Sponge, Non-Sterile 4x4 in 1 x Per  Week/ Discharge Instructions: Apply over primary dressing as directed. Secondary Dressing: ABD Pad, 8x10 1 x Per Week/ Discharge Instructions: Apply over primary dressing as directed. Compression Wrap: ThreePress (3 layer compression wrap) 1 x Per Week/ Discharge Instructions: Apply three layer compression as directed. #1 still Hydrofera Blue under 3 alert compression. 2 as long as his surface area continues to contract, that's all we need to do. Electronic Signature(s) Signed: 01/04/2022 4:44:59 PM By: [name] MD Entered By: [name] on 01/04/2022 14:34:07 -------------------------------------------------------------------------------- SuperBill Details Patient Name: Date of Service: SHANKAR, SILBER 01/04/2022 Medical Record Number: [id number] Patient Account Number: [id number] Date of Birth/Sex: Treating RN: September 26, 1932 (88 y.o. Collene Gobble Primary Care Provider: [name] Other Clinician: Referring Provider: Treating Provider/Extender: [name], [name] Weeks in Treatment: 4 Diagnosis Coding ICD-10 Codes Code Description [phone] Laceration without foreign body, right lower leg, subsequent encounter L97.818 Non-pressure chronic ulcer of other part of right lower leg with other specified severity I87.321 Chronic venous  hypertension (idiopathic) with inflammation of right lower extremity E11.42 Type 2 diabetes mellitus with diabetic polyneuropathy Facility Procedures CPT4 Code: [id number] Description: (Facility Use Only) [phone] - [place] [facility] L[facility] RT LEG ICD-10 Diagnosis Description L97.818 Non-pressure chronic ulcer of other part of right lower leg with other specified sever Modifier: ity Quantity: 1 Physician Procedures : CPT4 Code Description Modifier [id number] - WC PHYS LEVEL 3 - EST PT ICD-10 Diagnosis Description S81.811D Laceration without foreign body, right lower leg, subsequent encounter L97.818 Non-pressure chronic ulcer of other part of right lower leg  with other specified severity Quantity: 1 Electronic Signature(s) Signed: 01/04/2022 4:49:00 PM By: Dellie Catholic RN Signed: 01/05/2022 4:06:18 PM By: [name] MD Previous Signature: 01/04/2022 4:44:59 PM Version By: [name] MD Entered By: Dellie Catholic on 01/04/2022 16:47:24

## 2022-01-11 ENCOUNTER — Encounter: Payer: Self-pay | Admitting: Cardiovascular Disease

## 2022-01-11 ENCOUNTER — Ambulatory Visit (INDEPENDENT_AMBULATORY_CARE_PROVIDER_SITE_OTHER): Payer: Medicare Other | Admitting: Cardiovascular Disease

## 2022-01-11 ENCOUNTER — Encounter (HOSPITAL_BASED_OUTPATIENT_CLINIC_OR_DEPARTMENT_OTHER): Payer: Medicare Other | Admitting: Internal Medicine

## 2022-01-11 ENCOUNTER — Other Ambulatory Visit: Payer: Self-pay

## 2022-01-11 DIAGNOSIS — I130 Hypertensive heart and chronic kidney disease with heart failure and stage 1 through stage 4 chronic kidney disease, or unspecified chronic kidney disease: Secondary | ICD-10-CM | POA: Diagnosis not present

## 2022-01-11 DIAGNOSIS — E1142 Type 2 diabetes mellitus with diabetic polyneuropathy: Secondary | ICD-10-CM | POA: Diagnosis not present

## 2022-01-11 DIAGNOSIS — I87321 Chronic venous hypertension (idiopathic) with inflammation of right lower extremity: Secondary | ICD-10-CM | POA: Diagnosis not present

## 2022-01-11 DIAGNOSIS — I10 Essential (primary) hypertension: Secondary | ICD-10-CM

## 2022-01-11 DIAGNOSIS — T8133XA Disruption of traumatic injury wound repair, initial encounter: Secondary | ICD-10-CM | POA: Diagnosis not present

## 2022-01-11 DIAGNOSIS — L97812 Non-pressure chronic ulcer of other part of right lower leg with fat layer exposed: Secondary | ICD-10-CM | POA: Diagnosis not present

## 2022-01-11 DIAGNOSIS — E782 Mixed hyperlipidemia: Secondary | ICD-10-CM | POA: Diagnosis not present

## 2022-01-11 DIAGNOSIS — I87331 Chronic venous hypertension (idiopathic) with ulcer and inflammation of right lower extremity: Secondary | ICD-10-CM | POA: Diagnosis not present

## 2022-01-11 DIAGNOSIS — I6523 Occlusion and stenosis of bilateral carotid arteries: Secondary | ICD-10-CM | POA: Diagnosis not present

## 2022-01-11 DIAGNOSIS — I480 Paroxysmal atrial fibrillation: Secondary | ICD-10-CM | POA: Diagnosis not present

## 2022-01-11 DIAGNOSIS — Z951 Presence of aortocoronary bypass graft: Secondary | ICD-10-CM | POA: Diagnosis not present

## 2022-01-11 DIAGNOSIS — E1122 Type 2 diabetes mellitus with diabetic chronic kidney disease: Secondary | ICD-10-CM | POA: Diagnosis not present

## 2022-01-11 DIAGNOSIS — Z9889 Other specified postprocedural states: Secondary | ICD-10-CM

## 2022-01-11 MED ORDER — NITROGLYCERIN 0.4 MG SL SUBL: 0.4000 mg | SUBLINGUAL_TABLET | SUBLINGUAL | 5 refills | Status: DC | PRN

## 2022-01-11 NOTE — Progress Notes (Signed)
01/11/2022 [name]   1932/04/08  [id number]  Primary Physician Tonia Ghent, MD Primary Cardiologist: [name] MD [name], [place], [place]  HPI:  [name] is a 88 y.o.  engaged Caucasian male  formerly a patient of [name]. I last saw him in the office 06/21/2019. [name] is the father of one daughter and one son , the grandfather to 2 grandchildren.  He is accompanied by his daughter [name] today.  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 His past medical history is remarkable for coronary artery bypass grafting in 1996 and again in 2006 by [name]. He had remote right carotid endarterectomy by [name] in 1994. History of hypertension, hyperlipidemia and diabetes. He was hospitalized in late July 2015 because of unstable angina. [name] performed cardiac catheterization revealing a high-grade obtuse marginal branch vein graft stenosis which was stented. Because of recurrent chest pain he was re-intervention relates later and found to have a new hazy lesion just proximal to the previously placed stent which was again intervened on. He was deemed a Plavix nonresponder and was begun on Brilenta. He also has a remote history of PAF as well as diverticulitis and has had GI bleed on Coumadin. His oral anticoagulation was discontinued.   He was recently admitted on 11/13/15 for a GI bleed. He underwent endoscopy revealing gastritis, colonoscopy revealing bleeding diverticula which was treated with clipping. He had 3 units of packed red blood cell transfusion. He did have chest pain and positive enzymes after his endoscopy within the echo that showed an EF of 50% with a new anterior wall motion and reality. He was thought to have "a non-STEMI.  His Plavix was discontinued. Because of his moderate renal insufficiency and his inability to take antiplatelet therapy he did not undergo repeat cardiac catheterization. He  underwent left-sided hemicolectomy and sigmoidectomy because of chronic GI bleeding. This was uneventful. His hemoglobin has remained stable in the 12 range last checked in April.  Because of his new onset chest pain and dyspnea performed a Myoview stress test that showed a new inferolateral scar/ischemic abnormality and echo that showed segmental wall motion abnormalities.  Because of this I performed diagnostic coronary arteriography on him 08/02/2018 revealing a newly occluded RCA vein graft with a high-grade first circumflex obtuse marginal branch ostial stenosis which I was unable to access because of geometry.  S   He saw Kerin Ransom back in the office 03/20/2019 complaining of increasing dyspnea.  Low-dose long-acting nitrates were added.  Hemoglobin was stable at 11.  He continues to complain of increasing dyspnea.  He is in A. fib with a controlled ventricular response on Eliquis.    Since I saw him in the office over 2 years ago he did have a right left heart cath by Dr. Haroldine Laws for severe MR which he was symptomatic from.  He ultimately underwent successful edge-to-edge A2/P2 mitral clipping by Dr. Burt Knack 02/06/2020 [facility] resulting in reduction of 4+ to 1+ MR.  His most recent 2D echocardiogram performed 01/27/2021 revealed an EF of 50% with trivial MR and an RV systolic pressure of 54 mmHg.  He gets up occasional chest pain with not bad enough to require several nitroglycerin.   Current Meds  Medication Sig   allopurinol (ZYLOPRIM) 100 MG tablet TAKE ONE-HALF (1/2) TABLET EVERY MONDAY, WEDNESDAY AND FRIDAY   amoxicillin (AMOXIL) 500 MG tablet Take by mouth. Patient takes 4  tablets prior to going to the dentist   aspirin 81 MG tablet Take 81 mg by mouth daily.   BD INSULIN SYRINGE ULTRAFINE 31G X 5/16" 0.3 ML MISC USE DAILY AS INSTRUCTED   cetirizine (ZYRTEC) 10 MG tablet Take 10 mg by mouth daily as needed for allergies.   Cholecalciferol (VITAMIN D) 1000 UNITS capsule Take 1,000 Units by  mouth daily.    diclofenac Sodium (VOLTAREN) 1 % GEL Apply 2 g topically 4 (four) times daily.   ELIQUIS 2.5 MG TABS tablet TAKE 1 TABLET TWICE A DAY   fish oil-omega-3 fatty acids 1000 MG capsule Take 1 g by mouth daily.   furosemide (LASIX) 80 MG tablet TAKE 1 TABLET TWICE A DAY (DOSAGE CHANGE)   glucose blood (FREESTYLE LITE) test strip USE TO TEST BLOOD SUGAR ONCE DAILY AND AS NEEDED FOR DIABETES MELLITUS   hydrALAZINE (APRESOLINE) 25 MG tablet Take 1 tablet (25 mg total) by mouth in the morning and at bedtime.   ipratropium (ATROVENT) 0.03 % nasal spray USE 2 SPRAYS IN EACH NOSTRIL TWICE A DAY AS NEEDED FOR RHINITIS   JARDIANCE 10 MG TABS tablet TAKE 1 TABLET DAILY BEFORE BREAKFAST   LANTUS 100 UNIT/ML injection INJECT 20 TO 25 UNITS AT BEDTIME   magnesium oxide (MAG-OX) 400 MG tablet Take 400 mg by mouth daily.   metoprolol tartrate (LOPRESSOR) 50 MG tablet TAKE 1 TABLET TWICE A DAY   Multiple Vitamin (MULTIVITAMIN WITH MINERALS) TABS Take 1 tablet by mouth daily.   nitroGLYCERIN (NITROSTAT) 0.4 MG SL tablet Place 1 tablet (0.4 mg total) under the tongue every 5 (five) minutes as needed for chest pain.   Potassium Gluconate 595 MG CAPS Take 595 mg by mouth daily.   ranolazine (RANEXA) 500 MG 12 hr tablet TAKE 1 TABLET TWICE A DAY   rosuvastatin (CRESTOR) 40 MG tablet TAKE 1 TABLET AT BEDTIME   traMADol (ULTRAM) 50 MG tablet Take 1 tablet (50 mg total) by mouth every 6 (six) hours as needed.     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     Lives with girlfriend [name].  Widowed 12/2005.   Social Determinants of Health   Financial Resource Strain: Low Risk    Difficulty of Paying Living Expenses: Not hard at all  Food Insecurity: No Food Insecurity   Worried About Charity fundraiser in the Last Year: Never true   Arboriculturist in  the Last Year: Never true  Transportation Needs: No Transportation Needs   Lack of Transportation (Medical): No   Lack of Transportation (Non-Medical): No  Physical Activity: Insufficiently Active   Days of Exercise per Week: 3 days   Minutes of Exercise per Session: 10 min  Stress: No Stress Concern Present   Feeling of Stress : Not at all  Social Connections: Moderately Isolated   Frequency of Communication with Friends and Family: More than three times a week   Frequency of Social Gatherings with Friends and Family: More than three times a week   Attends Religious Services: Never   Marine scientist or Organizations: Yes   Attends Music therapist: More than 4 times per year   Marital Status: Widowed  Human resources officer Violence: Not At Risk   Fear of Current or Ex-Partner: No   Emotionally Abused: No   Physically Abused: No   Sexually Abused: No     Review of Systems: General: negative for chills, fever, night sweats or weight changes.  Cardiovascular: negative for chest pain, dyspnea on exertion, edema, orthopnea, palpitations, paroxysmal nocturnal dyspnea or shortness of breath Dermatological: negative for rash Respiratory: negative for cough or wheezing Urologic: negative for  hematuria Abdominal: negative for nausea, vomiting, diarrhea, bright red blood per rectum, melena, or hematemesis Neurologic: negative for visual changes, syncope, or dizziness All other systems reviewed and are otherwise negative except as noted above.    Blood pressure (!) 144/60, pulse (!) 56, height 5\' 7"  (1.702 m), weight 168 lb 3.2 oz (76.3 kg), SpO2 98 %.  General appearance: alert and no distress Neck: no adenopathy, no carotid bruit, no JVD, supple, symmetrical, trachea midline, and thyroid not enlarged, symmetric, no tenderness/mass/nodules Lungs: clear to auscultation bilaterally Heart: irregularly irregular rhythm Extremities: extremities normal, atraumatic, no cyanosis or edema Pulses: 2+ and symmetric Skin: Skin color, texture, turgor normal. No rashes or lesions Neurologic: Grossly normal  EKG atrial fibrillation with a ventricular sponsor 56, right bundle branch block/left anterior fascicular block (bifascicular block).  I personally reviewed this EKG.  ASSESSMENT AND PLAN:   HLD (hyperlipidemia) History of hyperlipidemia on statin therapy with lipid profile performed 10/29/2020 revealing total cholesterol 123, LDL 62 and HDL 30.  Hx of CABG History of CAD status post coronary artery bypass grafting in 1996 with redo bypass grafting in 2006 by [name].  His last cath was performed by myself 08/02/2018 revealing a newly occluded RCA vein graft with high-grade first obtuse marginal branch ostial stenosis which I was unable to access because of geometry.  He did have a right left heart cath by Dr. Haroldine Laws 01/06/2020 prior to his MitraClip.  He gets occasional chest pain but not enough to have taken nitroglycerin.  S/P mitral valve repair History of MitraClip performed by Dr. Burt Knack 02/06/2020 because of 4+ MR which he was symptomatic from.  He had A2 P2 clipping with subsequent resolution of his MR and improvement in his symptoms.  His last echo performed 01/27/2021  revealed normal LV systolic function with trivial MR.  HTN (hypertension) History of essential hypertension a blood pressure measured today at 144/60.  He is on metoprolol and hydralazine.  PAF (paroxysmal atrial fibrillation) ([place]) History of PAF on low-dose Eliquis oral anticoagulation rate controlled.     [name] MD FACP,FACC,FAHA, [facility] 01/11/2022 10:01 AM

## 2022-01-11 NOTE — Assessment & Plan Note (Signed)
History of essential hypertension a blood pressure measured today at 144/60.  He is on metoprolol and hydralazine.

## 2022-01-11 NOTE — Assessment & Plan Note (Signed)
History of PAF on low-dose Eliquis oral anticoagulation rate controlled.

## 2022-01-11 NOTE — Assessment & Plan Note (Signed)
History of CAD status post coronary artery bypass grafting in 1996 with redo bypass grafting in 2006 by [name].  His last cath was performed by myself 08/02/2018 revealing a newly occluded RCA vein graft with high-grade first obtuse marginal branch ostial stenosis which I was unable to access because of geometry.  He did have a right left heart cath by Dr. Haroldine Laws 01/06/2020 prior to his MitraClip.  He gets occasional chest pain but not enough to have taken nitroglycerin.

## 2022-01-11 NOTE — Assessment & Plan Note (Signed)
History of MitraClip performed by Dr. Burt Knack 02/06/2020 because of 4+ MR which he was symptomatic from.  He had A2 P2 clipping with subsequent resolution of his MR and improvement in his symptoms.  His last echo performed 01/27/2021 revealed normal LV systolic function with trivial MR.

## 2022-01-11 NOTE — Assessment & Plan Note (Signed)
History of hyperlipidemia on statin therapy with lipid profile performed 10/29/2020 revealing total cholesterol 123, LDL 62 and HDL 30.

## 2022-01-11 NOTE — Progress Notes (Signed)
BOLESLAUS, HOLLOWAY ([id number]) Visit Report for 01/11/2022 Arrival Information Details Patient Name: Date of Service: [name], [name] 01/11/2022 10:15 A M Medical Record Number: [id number] Patient Account Number: [id number] Date of Birth/Sex: Treating RN: [birth year] (88 y.o. Collene Gobble Primary Care Majid Mccravy: [name] Other Clinician: Referring Analisia Kingsford: Treating Shaylon Gillean/Extender: Earnest Conroy Weeks in Treatment: 5 Visit Information History Since Last Visit Added or deleted any medications: No Patient Arrived: [name] Any new allergies or adverse reactions: No Arrival Time: 10:35 Had a fall or experienced change in No Accompanied By: spouse activities of daily living that may affect Transfer Assistance: None risk of falls: Patient Identification Verified: Yes Signs or symptoms of abuse/neglect since last visito No Patient Requires Transmission-Based Precautions: No Hospitalized since last visit: No Patient Has Alerts: Yes Implantable device outside of the clinic excluding No Patient Alerts: Patient on Blood Thinner cellular tissue based products placed in the center since last visit: Has Dressing in Place as Prescribed: Yes Has Compression in Place as Prescribed: Yes Pain Present Now: No Electronic Signature(s) Signed: 01/11/2022 6:17:08 PM By: Dellie Catholic RN Entered By: Dellie Catholic on 01/11/2022 10:40:53 -------------------------------------------------------------------------------- Compression Therapy Details Patient Name: Date of Service: Jeremiah Bang 01/11/2022 10:15 A M Medical Record Number: [id number] Patient Account Number: [id number] Date of Birth/Sex: Treating RN: 1932/07/01 (88 y.o. Collene Gobble Primary Care Sefora Tietje: [name] Other Clinician: Referring Mickal Meno: Treating Patrena Santalucia/Extender: Earnest Conroy Weeks in Treatment: 5 Compression Therapy Performed for Wound  Assessment: Wound #1 Right,Anterior Lower Leg Performed By: Clinician Dellie Catholic, RN Compression Type: Three Layer Post Procedure Diagnosis Same as Pre-procedure Electronic Signature(s) Signed: 01/11/2022 6:17:08 PM By: Dellie Catholic RN Entered By: Dellie Catholic on 01/11/2022 18:08:57 -------------------------------------------------------------------------------- Encounter Discharge Information Details Patient Name: Date of Service: Jeremiah Bang. 01/11/2022 10:15 A M Medical Record Number: [id number] Patient Account Number: [id number] Date of Birth/Sex: Treating RN: 24-Feb-1932 (88 y.o. Collene Gobble Primary Care Anayi Bricco: [name] Other Clinician: Referring Merideth Bosque: Treating Corda Shutt/Extender: Earnest Conroy Weeks in Treatment: 5 Encounter Discharge Information Items Discharge Condition: Stable Ambulatory Status: Ambulatory Discharge Destination: Home Transportation: Private Auto Accompanied By: spouse Schedule Follow-up Appointment: Yes Clinical Summary of Care: Patient Declined Electronic Signature(s) Signed: 01/11/2022 6:17:08 PM By: Dellie Catholic RN Entered By: Dellie Catholic on 01/11/2022 18:12:18 -------------------------------------------------------------------------------- Lower Extremity Assessment Details Patient Name: Date of Service: [name], [name] 01/11/2022 10:15 A M Medical Record Number: [id number] Patient Account Number: [id number] Date of Birth/Sex: Treating RN: Aug 13, 1932 (88 y.o. Collene Gobble Primary Care Caoilainn Sacks: [name] Other Clinician: Referring Ricardo Kayes: Treating Ajla Mcgeachy/Extender: Earnest Conroy Weeks in Treatment: 5 Edema Assessment Assessed: Shirlyn Goltz: No] Patrice Paradise: No] E[Left: dema] [Right: :] Calf Left: Right: Point of Measurement: 33 cm From Medial Instep 28 cm Ankle Left: Right: Point of Measurement: 11 cm From Medial Instep 21.8 cm Electronic  Signature(s) Signed: 01/11/2022 6:17:08 PM By: Dellie Catholic RN Entered By: Dellie Catholic on 01/11/2022 10:57:17 -------------------------------------------------------------------------------- Multi Wound Chart Details Patient Name: Date of Service: Jeremiah Bang. 01/11/2022 10:15 A M Medical Record Number: [id number] Patient Account Number: [id number] Date of Birth/Sex: Treating RN: Jun 19, 1932 (88 y.o. Collene Gobble Primary Care Kaileena Obi: [name] Other Clinician: Referring Yailen Zemaitis: Treating Keali Mccraw/Extender: Earnest Conroy Weeks in Treatment: 5 Vital Signs Height(in): 68 Pulse(bpm): 22 Weight(lbs): 161 Blood Pressure(mmHg): 152/75 Body Mass Index(BMI): 24.5 Temperature(F): 98.3 Respiratory Rate(breaths/min): 18 Photos: [N/A:N/A] Right, Anterior Lower Leg N/A N/A  Wound Location: Trauma N/A N/A Wounding Event: Venous Leg Ulcer N/A N/A Primary Etiology: Anemia, Arrhythmia, Congestive Heart N/A N/A Comorbid History: Failure, Coronary Artery Disease, Hypertension, Myocardial Infarction, Type II Diabetes, Gout, Neuropathy 11/18/2021 N/A N/A Date Acquired: 5 N/A N/A Weeks of Treatment: Open N/A N/A Wound Status: No N/A N/A Wound Recurrence: 3.9x0.6x0.2 N/A N/A Measurements L x W x D (cm) 1.838 N/A N/A A (cm) : rea 0.368 N/A N/A Volume (cm) : 90.00% N/A N/A % Reduction in Area: 80.00% N/A N/A % Reduction in Volume: Full Thickness Without Exposed N/A N/A Classification: Support Structures Medium N/A N/A Exudate Amount: Serosanguineous N/A N/A Exudate Type: red, brown N/A N/A Exudate Color: Flat and Intact N/A N/A Wound Margin: Large (67-100%) N/A N/A Granulation Amount: Red N/A N/A Granulation Quality: Small (1-33%) N/A N/A Necrotic Amount: Fat Layer (Subcutaneous Tissue): Yes N/A N/A Exposed Structures: Fascia: No Tendon: No Muscle: No Joint: No Bone: No Small (1-33%) N/A  N/A Epithelialization: Treatment Notes Electronic Signature(s) Signed: 01/11/2022 4:13:40 PM By: [name] MD Signed: 01/11/2022 6:17:08 PM By: Dellie Catholic RN Entered By: [name] on 01/11/2022 11:19:49 -------------------------------------------------------------------------------- Multi-Disciplinary Care Plan Details Patient Name: Date of Service: Jeremiah Bang. 01/11/2022 10:15 A M Medical Record Number: [id number] Patient Account Number: [id number] Date of Birth/Sex: Treating RN: 01-13-1932 (88 y.o. Collene Gobble Primary Care Pharoah Goggins: Other Clinician: [name] Referring Toniann Dickerson: Treating Larah Kuntzman/Extender: Veneta Penton in Treatment: 5 Multidisciplinary Care Plan reviewed with physician Active Inactive Abuse / Safety / Falls / Self Care Management Nursing Diagnoses: History of Falls Potential for injury related to falls Goals: Patient will not experience any injury related to falls Date Initiated: 12/07/2021 Target Resolution Date: 02/01/2022 Goal Status: Active Patient/caregiver will verbalize/demonstrate measures taken to prevent injury and/or falls Date Initiated: 12/07/2021 Target Resolution Date: 02/01/2022 Goal Status: Active Interventions: Assess Activities of Daily Living upon admission and as needed Assess fall risk on admission and as needed Assess: immobility, friction, shearing, incontinence upon admission and as needed Assess impairment of mobility on admission and as needed per policy Assess personal safety and home safety (as indicated) on admission and as needed Assess self care needs on admission and as needed Provide education on fall prevention Provide education on personal and home safety Notes: Nutrition Nursing Diagnoses: Impaired glucose control: actual or potential Potential for alteratiion in Nutrition/Potential for imbalanced nutrition Goals: Patient/caregiver agrees to and  verbalizes understanding of need to use nutritional supplements and/or vitamins as prescribed Date Initiated: 12/07/2021 Target Resolution Date: 02/01/2022 Goal Status: Active Patient/caregiver will maintain therapeutic glucose control Date Initiated: 12/07/2021 Target Resolution Date: 02/01/2022 Goal Status: Active Interventions: Assess HgA1c results as ordered upon admission and as needed Assess patient nutrition upon admission and as needed per policy Provide education on elevated blood sugars and impact on wound healing Provide education on nutrition Treatment Activities: Education provided on Nutrition : 12/07/2021 Notes: Wound/Skin Impairment Nursing Diagnoses: Impaired tissue integrity Knowledge deficit related to ulceration/compromised skin integrity Goals: Patient/caregiver will verbalize understanding of skin care regimen Date Initiated: 12/07/2021 Target Resolution Date: 02/01/2022 Goal Status: Active Ulcer/skin breakdown will have a volume reduction of 30% by week 4 Date Initiated: 12/07/2021 Target Resolution Date: 02/01/2022 Goal Status: Active Interventions: Assess patient/caregiver ability to obtain necessary supplies Assess patient/caregiver ability to perform ulcer/skin care regimen upon admission and as needed Assess ulceration(s) every visit Provide education on ulcer and skin care Notes: Electronic Signature(s) Signed: 01/11/2022 6:17:08 PM By: Dellie Catholic RN Entered By: Minus Liberty,  Mechele Claude on 01/11/2022 18:07:01 -------------------------------------------------------------------------------- Pain Assessment Details Patient Name: Date of Service: [name], [name] 01/11/2022 10:15 A M Medical Record Number: [id number] Patient Account Number: [id number] Date of Birth/Sex: Treating RN: 1932-09-23 (88 y.o. Collene Gobble Primary Care Alto Gandolfo: [name] Other Clinician: Referring Arelyn Gauer: Treating Phoebie Shad/Extender: Earnest Conroy Weeks in Treatment: 5 Active Problems Location of Pain Severity and Description of Pain Patient Has Paino No Site Locations Pain Management and Medication Current Pain Management: Electronic Signature(s) Signed: 01/11/2022 6:17:08 PM By: Dellie Catholic RN Entered By: Dellie Catholic on 01/11/2022 10:44:43 -------------------------------------------------------------------------------- Patient/Caregiver Education Details Patient Name: Date of Service: [name], [name] 2/28/2023andnbsp10:15 [place] Record Number: [id number] Patient Account Number: [id number] Date of Birth/Gender: Treating RN: [birth year] (88 y.o. Collene Gobble Primary Care Physician: [name] Other Clinician: Referring Physician: Treating Physician/Extender: Veneta Penton in Treatment: 5 Education Assessment Education Provided To: Patient Education Topics Provided Wound/Skin Impairment: Methods: Explain/Verbal Responses: Return demonstration correctly Electronic Signature(s) Signed: 01/11/2022 6:17:08 PM By: Dellie Catholic RN Entered By: Dellie Catholic on 01/11/2022 18:07:19 -------------------------------------------------------------------------------- Wound Assessment Details Patient Name: Date of Service: [name], [name] 01/11/2022 10:15 A M Medical Record Number: [id number] Patient Account Number: [id number] Date of Birth/Sex: Treating RN: 1932/03/19 (88 y.o. Collene Gobble Primary Care Jari Carollo: [name] Other Clinician: Referring Yalda Herd: Treating Lonetta Blassingame/Extender: Earnest Conroy Weeks in Treatment: 5 Wound Status Wound Number: 1 Primary Venous Leg Ulcer Etiology: Wound Location: Right, Anterior Lower Leg Wound Open Wounding Event: Trauma Status: Date Acquired: 11/18/2021 Comorbid Anemia, Arrhythmia, Congestive Heart Failure, Coronary Artery Weeks Of Treatment: 5 History: Disease, Hypertension,  Myocardial Infarction, Type II Diabetes, Clustered Wound: No Gout, Neuropathy Photos Wound Measurements Length: (cm) 3.9 Width: (cm) 0.6 Depth: (cm) 0.2 Area: (cm) 1.838 Volume: (cm) 0.368 % Reduction in Area: 90% % Reduction in Volume: 80% Epithelialization: Small (1-33%) Tunneling: No Undermining: No Wound Description Classification: Full Thickness Without Exposed Support Structures Wound Margin: Flat and Intact Exudate Amount: Medium Exudate Type: Serosanguineous Exudate Color: red, brown Foul Odor After Cleansing: No Slough/Fibrino Yes Wound Bed Granulation Amount: Large (67-100%) Exposed Structure Granulation Quality: Red Fascia Exposed: No Necrotic Amount: Small (1-33%) Fat Layer (Subcutaneous Tissue) Exposed: Yes Necrotic Quality: Adherent Slough Tendon Exposed: No Muscle Exposed: No Joint Exposed: No Bone Exposed: No Treatment Notes Wound #1 (Lower Leg) Wound Laterality: Right, Anterior Cleanser Soap and Water Discharge Instruction: May shower and wash wound with dial antibacterial soap and water prior to dressing change. Wound Cleanser Discharge Instruction: Cleanse the wound with wound cleanser prior to applying a clean dressing using gauze sponges, not tissue or cotton balls. Peri-Wound Care Triamcinolone 15 (g) Discharge Instruction: Use triamcinolone 15 (g) as directed Sween Lotion (Moisturizing lotion) Discharge Instruction: Apply moisturizing lotion as directed Topical Primary Dressing Hydrofera Blue Classic Foam, 2x2 in Discharge Instruction: Moisten with saline prior to applying to wound bed Secondary Dressing Woven Gauze Sponge, Non-Sterile 4x4 in Discharge Instruction: Apply over primary dressing as directed. ABD Pad, 8x10 Discharge Instruction: Apply over primary dressing as directed. Secured With Compression Wrap ThreePress (3 layer compression wrap) Discharge Instruction: Apply three layer compression as directed. Compression  Stockings Add-Ons Electronic Signature(s) Signed: 01/11/2022 6:17:08 PM By: Dellie Catholic RN Entered By: Dellie Catholic on 01/11/2022 11:05:04 -------------------------------------------------------------------------------- Vitals Details Patient Name: Date of Service: Jeremiah Bang. 01/11/2022 10:15 A M Medical Record Number: [id number] Patient Account Number: [id number] Date of Birth/Sex: Treating RN: 1932/09/01 (88 y.o. M) [place],  Mechele Claude Primary Care Witten Certain: [name] Other Clinician: Referring Trenia Tennyson: Treating Cooper Moroney/Extender: Earnest Conroy Weeks in Treatment: 5 Vital Signs Time Taken: 10:41 Temperature (F): 98.3 Height (in): 68 Pulse (bpm): 64 Weight (lbs): 161 Respiratory Rate (breaths/min): 18 Body Mass Index (BMI): 24.5 Blood Pressure (mmHg): 152/75 Reference Range: 80 - 120 mg / dl Electronic Signature(s) Signed: 01/11/2022 6:17:08 PM By: Dellie Catholic RN Entered By: Dellie Catholic on 01/11/2022 10:44:33

## 2022-01-11 NOTE — Patient Instructions (Signed)
Medication Instructions:  Your physician recommends that you continue on your current medications as directed. Please refer to the Current Medication list given to you today.  *If you need a refill on your cardiac medications before your next appointment, please call your pharmacy*   Testing/Procedures: Your physician has requested that you have an echocardiogram. Echocardiography is a painless test that uses sound waves to create images of your heart. It provides your doctor with information about the size and shape of your heart and how well your hearts chambers and valves are working. This procedure takes approximately one hour. There are no restrictions for this procedure. This procedure will be done at [address].      Follow-Up: At [facility], you and your health needs are our priority.  As part of our continuing mission to provide you with exceptional heart care, we have created designated Provider Care Teams.  These Care Teams include your primary Cardiologist (physician) and Advanced Practice Providers (APPs -  Physician Assistants and Nurse Practitioners) who all work together to provide you with the care you need, when you need it.  We recommend signing up for the patient portal called "MyChart".  Sign up information is provided on this After Visit Summary.  MyChart is used to connect with patients for Virtual Visits (Telemedicine).  Patients are able to view lab/test results, encounter notes, upcoming appointments, etc.  Non-urgent messages can be sent to your provider as well.   To learn more about what you can do with MyChart, go to [email or web address].    Your next appointment:   12 month(s)  The format for your next appointment:   In Person  Provider:   Quay Burow, MD

## 2022-01-17 ENCOUNTER — Ambulatory Visit (HOSPITAL_COMMUNITY): Payer: Medicare Other | Attending: Internal Medicine

## 2022-01-17 ENCOUNTER — Other Ambulatory Visit: Payer: Self-pay

## 2022-01-17 DIAGNOSIS — I480 Paroxysmal atrial fibrillation: Secondary | ICD-10-CM | POA: Diagnosis not present

## 2022-01-17 DIAGNOSIS — E782 Mixed hyperlipidemia: Secondary | ICD-10-CM

## 2022-01-17 DIAGNOSIS — Z9889 Other specified postprocedural states: Secondary | ICD-10-CM | POA: Insufficient documentation

## 2022-01-17 DIAGNOSIS — I10 Essential (primary) hypertension: Secondary | ICD-10-CM | POA: Diagnosis not present

## 2022-01-17 DIAGNOSIS — Z951 Presence of aortocoronary bypass graft: Secondary | ICD-10-CM | POA: Diagnosis not present

## 2022-01-17 LAB — ECHOCARDIOGRAM COMPLETE
AR max vel: 1.17 cm2
AV Area VTI: 1.09 cm2
AV Area mean vel: 1.16 cm2
AV Mean grad: 10 mmHg
AV Peak grad: 19.4 mmHg
Ao pk vel: 2.2 m/s
Area-P 1/2: 2.5 cm2
MV VTI: 1.19 cm2
S' Lateral: 3.5 cm

## 2022-01-18 ENCOUNTER — Encounter (HOSPITAL_BASED_OUTPATIENT_CLINIC_OR_DEPARTMENT_OTHER): Payer: Medicare Other | Attending: General Surgery | Admitting: General Surgery

## 2022-01-18 DIAGNOSIS — N189 Chronic kidney disease, unspecified: Secondary | ICD-10-CM | POA: Insufficient documentation

## 2022-01-18 DIAGNOSIS — I5032 Chronic diastolic (congestive) heart failure: Secondary | ICD-10-CM | POA: Diagnosis not present

## 2022-01-18 DIAGNOSIS — I130 Hypertensive heart and chronic kidney disease with heart failure and stage 1 through stage 4 chronic kidney disease, or unspecified chronic kidney disease: Secondary | ICD-10-CM | POA: Diagnosis not present

## 2022-01-18 DIAGNOSIS — Z7901 Long term (current) use of anticoagulants: Secondary | ICD-10-CM | POA: Insufficient documentation

## 2022-01-18 DIAGNOSIS — Z794 Long term (current) use of insulin: Secondary | ICD-10-CM | POA: Diagnosis not present

## 2022-01-18 DIAGNOSIS — I872 Venous insufficiency (chronic) (peripheral): Secondary | ICD-10-CM | POA: Diagnosis not present

## 2022-01-18 DIAGNOSIS — I87331 Chronic venous hypertension (idiopathic) with ulcer and inflammation of right lower extremity: Secondary | ICD-10-CM | POA: Diagnosis not present

## 2022-01-18 DIAGNOSIS — E1142 Type 2 diabetes mellitus with diabetic polyneuropathy: Secondary | ICD-10-CM | POA: Insufficient documentation

## 2022-01-18 DIAGNOSIS — L97812 Non-pressure chronic ulcer of other part of right lower leg with fat layer exposed: Secondary | ICD-10-CM | POA: Diagnosis not present

## 2022-01-18 DIAGNOSIS — I4891 Unspecified atrial fibrillation: Secondary | ICD-10-CM | POA: Diagnosis not present

## 2022-01-18 DIAGNOSIS — E1122 Type 2 diabetes mellitus with diabetic chronic kidney disease: Secondary | ICD-10-CM | POA: Insufficient documentation

## 2022-01-18 DIAGNOSIS — L97818 Non-pressure chronic ulcer of other part of right lower leg with other specified severity: Secondary | ICD-10-CM | POA: Diagnosis not present

## 2022-01-18 NOTE — Progress Notes (Signed)
[name] ([id number]) Visit Report for 01/18/2022 Chief Complaint Document Details Patient Name: Date of Service: [name], [name] 01/18/2022 3:30 PM Medical Record Number: [id number] Patient Account Number: [id number] Date of Birth/Sex: Treating RN: [birth year] (88 y.o. M) Primary Care Provider: [name] Other Clinician: Referring Provider: Treating Provider/Extender: Venancio Poisson Weeks in Treatment: 6 Information Obtained from: Patient Chief Complaint 12/07/2021; patient comes in for review of a wound on his right anterior lower leg which was initially traumatic Electronic Signature(s) Signed: 01/18/2022 4:34:11 PM By: Fredirick Maudlin MD FACS Entered By: Fredirick Maudlin on 01/18/2022 16:34:11 -------------------------------------------------------------------------------- Debridement Details Patient Name: Date of Service: [name]. 01/18/2022 3:30 PM Medical Record Number: [id number] Patient Account Number: [id number] Date of Birth/Sex: Treating RN: May 15, 1932 (88 y.o. [name] Primary Care Provider: [name] Other Clinician: Referring Provider: Treating Provider/Extender: Venancio Poisson Weeks in Treatment: 6 Debridement Performed for Assessment: Wound #1 Right,Anterior Lower Leg Performed By: Physician Fredirick Maudlin, MD Debridement Type: Debridement Severity of Tissue Pre Debridement: Fat layer exposed Level of Consciousness (Pre-procedure): Awake and Alert Pre-procedure Verification/Time Out Yes - 16:31 Taken: Start Time: 16:31 T Area Debrided (L x W): otal 3 (cm) x 0.5 (cm) = 1.5 (cm) Tissue and other material debrided: Non-Viable, Slough, Slough Level: Non-Viable Tissue Debridement Description: Selective/Open Wound Instrument: Curette Bleeding: Minimum Hemostasis Achieved: Pressure End Time: 16:32 Procedural Pain: 0 Post Procedural Pain: 0 Response to Treatment: Procedure was  tolerated well Level of Consciousness (Post- Awake and Alert procedure): Post Debridement Measurements of Total Wound Length: (cm) 3 Width: (cm) 0.5 Depth: (cm) 0.1 Volume: (cm) 0.118 Character of Wound/Ulcer Post Debridement: Improved Severity of Tissue Post Debridement: Fat layer exposed Post Procedure Diagnosis Same as Pre-procedure Electronic Signature(s) Signed: 01/18/2022 5:26:37 PM By: Fredirick Maudlin MD FACS Signed: 01/18/2022 6:26:46 PM By: [name], [name] Entered By: Levan Hurst on 01/18/2022 16:33:37 -------------------------------------------------------------------------------- HPI Details Patient Name: Date of Service: [name]. 01/18/2022 3:30 PM Medical Record Number: [id number] Patient Account Number: [id number] Date of Birth/Sex: Treating RN: February 03, 1932 (88 y.o. M) Primary Care Provider: [name] Other Clinician: Referring Provider: Treating Provider/Extender: Venancio Poisson Weeks in Treatment: 6 History of Present Illness HPI Description: ADMISSION 12/07/2021 This is a pleasant 88 year old man who arrives in clinic accompanied by his daughter for review of a traumatic wound on the right leg that happened on 11/18/2021. This was a result of a fall he had at home. He was seen in the ER on the same day and had 7 sutures placed. Ultimately the sutures were removed by the family physician on 1/16 however the wound dehisced. They stated that it was hard to get the original dressing off that he had not changed as well. It was felt to be infected. He was given Rocephin and subsequently doxycycline which he finished yesterday. A culture at that time showed methicillin sensitive staph aureus. He comes in the clinic today with eschar over the entire wound surface. There is erythema around the wound but he says he had some of that even before this happened. He quite clearly has chronic venous insufficiency bilaterally. Past medical  history includes basal cell CA of the skin, hypertension, chronic kidney disease, heart failure with preserved ejection fraction, coronary artery disease, carotid stenosis, atrial fib on Eliquis, history of vein stripping in the right leg, type 2 diabetes on insulin and mitral valve regurgitation ABI in our clinic was 0.92 on the right 2/21;  continued nice improvements in this wound. Healthy granulation using Hydrofera Blue under 3 lack compression. 1/31; wound looks better this week smaller still requiring debridement. We will use silver alginate under compression 2/7; wound looks better no debridement is required I changed him to [facility] today still under compression 2/14; continued nice improvement Hydrofera Blue under compression 2/28; continued improvement in wound measurements using Hydrofera Blue and compression. This wound looks like it's progressing towards closure initially a large traumatic wound in the setting of chronic venous insufficiency 01/18/2022: Continued improvement with Hydrofera Blue and compression. Minimal debridement required. Electronic Signature(s) Signed: 01/18/2022 4:34:44 PM By: Fredirick Maudlin MD FACS Entered By: Fredirick Maudlin on 01/18/2022 16:34:44 -------------------------------------------------------------------------------- Physical Exam Details Patient Name: Date of Service: [name] 01/18/2022 3:30 PM Medical Record Number: [id number] Patient Account Number: [id number] Date of Birth/Sex: Treating RN: [birth year] (88 y.o. M) Primary Care Provider: [name] Other Clinician: Referring Provider: Treating Provider/Extender: Venancio Poisson Weeks in Treatment: 6 Constitutional . . . . No acute distress. Respiratory Normal work of breathing on room air. Notes 01/18/2022: Wound examright anterior lower leg wound continues to close. Good granulation tissue with minimal slough. No concern for infection. Slough  easily debrided with a curette. Electronic Signature(s) Signed: 01/18/2022 4:35:54 PM By: Fredirick Maudlin MD FACS Entered By: Fredirick Maudlin on 01/18/2022 16:35:53 -------------------------------------------------------------------------------- Physician Orders Details Patient Name: Date of Service: [name]. 01/18/2022 3:30 PM Medical Record Number: [id number] Patient Account Number: [id number] Date of Birth/Sex: Treating RN: [birth year] (88 y.o. [name], [name] Primary Care Provider: [name] Other Clinician: Referring Provider: Treating Provider/Extender: Venancio Poisson Weeks in Treatment: 6 Verbal / Phone Orders: No Diagnosis Coding ICD-10 Coding Code Description [phone] Laceration without foreign body, right lower leg, subsequent encounter L97.818 Non-pressure chronic ulcer of other part of right lower leg with other specified severity I87.321 Chronic venous hypertension (idiopathic) with inflammation of right lower extremity E11.42 Type 2 diabetes mellitus with diabetic polyneuropathy Follow-up Appointments ppointment in 1 week. - Dr Celine Ahr Return A Bathing/ Shower/ Hygiene May shower with protection but do not get wound dressing(s) wet. - Ok to use Market researcher, can purchase at [facility], [facility], or [employer] Edema Control - Lymphedema / SCD / Other Elevate legs to the level of the heart or above for 30 minutes daily and/or when sitting, a frequency of: - throughout the day Avoid standing for long periods of time. Exercise regularly Moisturize legs daily. - left leg Wound Treatment Wound #1 - Lower Leg Wound Laterality: Right, Anterior Cleanser: Soap and Water 1 x Per Week Discharge Instructions: May shower and wash wound with dial antibacterial soap and water prior to dressing change. Cleanser: Wound Cleanser 1 x Per Week Discharge Instructions: Cleanse the wound with wound cleanser prior to applying a clean dressing using gauze  sponges, not tissue or cotton balls. Peri-Wound Care: Triamcinolone 15 (g) 1 x Per Week Discharge Instructions: Use triamcinolone 15 (g) as directed Peri-Wound Care: Sween Lotion (Moisturizing lotion) 1 x Per Week Discharge Instructions: Apply moisturizing lotion as directed Prim Dressing: Hydrofera Blue Classic Foam, 2x2 in 1 x Per Week ary Discharge Instructions: Moisten with saline prior to applying to wound bed Secondary Dressing: Woven Gauze Sponge, Non-Sterile 4x4 in 1 x Per Week Discharge Instructions: Apply over primary dressing as directed. Secondary Dressing: ABD Pad, 8x10 1 x Per Week Discharge Instructions: Apply over primary dressing as directed. Compression Wrap: ThreePress (3 layer compression wrap) 1 x Per Week  Discharge Instructions: Apply three layer compression as directed. Electronic Signature(s) Signed: 01/18/2022 5:26:37 PM By: Fredirick Maudlin MD FACS Entered By: Fredirick Maudlin on 01/18/2022 16:36:08 -------------------------------------------------------------------------------- Problem List Details Patient Name: Date of Service: [name]. 01/18/2022 3:30 PM Medical Record Number: [id number] Patient Account Number: [id number] Date of Birth/Sex: Treating RN: 1932/01/26 (88 y.o. [name] Primary Care Provider: [name] Other Clinician: Referring Provider: Treating Provider/Extender: Venancio Poisson Weeks in Treatment: 6 Active Problems ICD-10 Encounter Code Description Active Date MDM Diagnosis S81.811D Laceration without foreign body, right lower leg, subsequent encounter 12/07/2021 No Yes L97.818 Non-pressure chronic ulcer of other part of right lower leg with other specified 12/07/2021 No Yes severity I87.321 Chronic venous hypertension (idiopathic) with inflammation of right lower 12/07/2021 No Yes extremity E11.42 Type 2 diabetes mellitus with diabetic polyneuropathy 12/07/2021 No Yes Inactive  Problems Resolved Problems Electronic Signature(s) Signed: 01/18/2022 4:33:54 PM By: Fredirick Maudlin MD FACS Entered By: Fredirick Maudlin on 01/18/2022 16:33:53 -------------------------------------------------------------------------------- Progress Note Details Patient Name: Date of Service: [name]. 01/18/2022 3:30 PM Medical Record Number: [id number] Patient Account Number: [id number] Date of Birth/Sex: Treating RN: 1932/10/14 (88 y.o. M) Primary Care Provider: [name] Other Clinician: Referring Provider: Treating Provider/Extender: Venancio Poisson Weeks in Treatment: 6 Subjective Chief Complaint Information obtained from Patient 12/07/2021; patient comes in for review of a wound on his right anterior lower leg which was initially traumatic History of Present Illness (HPI) ADMISSION 12/07/2021 This is a pleasant 88 year old man who arrives in clinic accompanied by his daughter for review of a traumatic wound on the right leg that happened on 11/18/2021. This was a result of a fall he had at home. He was seen in the ER on the same day and had 7 sutures placed. Ultimately the sutures were removed by the family physician on 1/16 however the wound dehisced. They stated that it was hard to get the original dressing off that he had not changed as well. It was felt to be infected. He was given Rocephin and subsequently doxycycline which he finished yesterday. A culture at that time showed methicillin sensitive staph aureus. He comes in the clinic today with eschar over the entire wound surface. There is erythema around the wound but he says he had some of that even before this happened. He quite clearly has chronic venous insufficiency bilaterally. Past medical history includes basal cell CA of the skin, hypertension, chronic kidney disease, heart failure with preserved ejection fraction, coronary artery disease, carotid stenosis, atrial fib on Eliquis,  history of vein stripping in the right leg, type 2 diabetes on insulin and mitral valve regurgitation ABI in our clinic was 0.92 on the right 2/21; continued nice improvements in this wound. Healthy granulation using Hydrofera Blue under 3 lack compression. 1/31; wound looks better this week smaller still requiring debridement. We will use silver alginate under compression 2/7; wound looks better no debridement is required I changed him to [facility] today still under compression 2/14; continued nice improvement Hydrofera Blue under compression 2/28; continued improvement in wound measurements using Hydrofera Blue and compression. This wound looks like it's progressing towards closure initially a large traumatic wound in the setting of chronic venous insufficiency 01/18/2022: Continued improvement with Hydrofera Blue and compression. Minimal debridement required. Patient History Information obtained from Patient. Social History Never smoker, Marital Status - Widowed, Alcohol Use - Never, Drug Use - No History, Caffeine Use - Never. Medical History Eyes Denies history of  Cataracts, Glaucoma, Optic Neuritis Ear/Nose/Mouth/Throat Denies history of Chronic sinus problems/congestion, Middle ear problems Hematologic/Lymphatic Patient has history of Anemia Denies history of Hemophilia, Human Immunodeficiency Virus, Lymphedema, Sickle Cell Disease Respiratory Denies history of Aspiration, Asthma, Chronic Obstructive Pulmonary Disease (COPD), Pneumothorax, Sleep Apnea, Tuberculosis Cardiovascular Patient has history of Arrhythmia - Afib, Congestive Heart Failure, Coronary Artery Disease, Hypertension, Myocardial Infarction - bypass 1996, 2006 Endocrine Patient has history of Type II Diabetes Genitourinary Denies history of End Stage Renal Disease Musculoskeletal Patient has history of Gout Neurologic Patient has history of Neuropathy Medical A Surgical History  Notes nd Eyes ARMD Genitourinary CKD III Objective Constitutional No acute distress. Vitals Time Taken: 1:51 PM, Height: 68 in, Weight: 161 lbs, BMI: 24.5, Temperature: 98.4 F, Pulse: 65 bpm, Respiratory Rate: 18 breaths/min, Blood Pressure: 134/76 mmHg. Respiratory Normal work of breathing on room air. General Notes: 01/18/2022: Wound examooright anterior lower leg wound continues to close. Good granulation tissue with minimal slough. No concern for infection. Slough easily debrided with a curette. Integumentary (Hair, Skin) Wound #1 status is Open. Original cause of wound was Trauma. The date acquired was: 11/18/2021. The wound has been in treatment 6 weeks. The wound is located on the Right,Anterior Lower Leg. The wound measures 3cm length x 0.5cm width x 0.1cm depth; 1.178cm^2 area and 0.118cm^3 volume. There is Fat Layer (Subcutaneous Tissue) exposed. There is no tunneling or undermining noted. There is a medium amount of serosanguineous drainage noted. The wound margin is flat and intact. There is large (67-100%) red granulation within the wound bed. There is a small (1-33%) amount of necrotic tissue within the wound bed including Adherent Slough. Assessment Active Problems ICD-10 Laceration without foreign body, right lower leg, subsequent encounter Non-pressure chronic ulcer of other part of right lower leg with other specified severity Chronic venous hypertension (idiopathic) with inflammation of right lower extremity Type 2 diabetes mellitus with diabetic polyneuropathy Procedures Wound #1 Pre-procedure diagnosis of Wound #1 is a Venous Leg Ulcer located on the Right,Anterior Lower Leg .Severity of Tissue Pre Debridement is: Fat layer exposed. There was a Selective/Open Wound Non-Viable Tissue Debridement with a total area of 1.5 sq cm performed by Fredirick Maudlin, MD. With the following instrument(s): Curette to remove Non-Viable tissue/material. Material removed includes  [facility]. No specimens were taken. A time out was conducted at 16:31, prior to the start of the procedure. A Minimum amount of bleeding was controlled with Pressure. The procedure was tolerated well with a pain level of 0 throughout and a pain level of 0 following the procedure. Post Debridement Measurements: 3cm length x 0.5cm width x 0.1cm depth; 0.118cm^3 volume. Character of Wound/Ulcer Post Debridement is improved. Severity of Tissue Post Debridement is: Fat layer exposed. Post procedure Diagnosis Wound #1: Same as Pre-Procedure Pre-procedure diagnosis of Wound #1 is a Venous Leg Ulcer located on the Right,Anterior Lower Leg . There was a Three Layer Compression Therapy Procedure by Levan Hurst, RN. Post procedure Diagnosis Wound #1: Same as Pre-Procedure Plan Follow-up Appointments: Return Appointment in 1 week. - Dr Celine Ahr Bathing/ Shower/ Hygiene: May shower with protection but do not get wound dressing(s) wet. - Ok to use Market researcher, can purchase at [facility], [facility], or [employer] Edema Control - Lymphedema / SCD / Other: Elevate legs to the level of the heart or above for 30 minutes daily and/or when sitting, a frequency of: - throughout the day Avoid standing for long periods of time. Exercise regularly Moisturize legs daily. - left leg WOUND #1: -  Lower Leg Wound Laterality: Right, Anterior Cleanser: Soap and Water 1 x Per Week/ Discharge Instructions: May shower and wash wound with dial antibacterial soap and water prior to dressing change. Cleanser: Wound Cleanser 1 x Per Week/ Discharge Instructions: Cleanse the wound with wound cleanser prior to applying a clean dressing using gauze sponges, not tissue or cotton balls. Peri-Wound Care: Triamcinolone 15 (g) 1 x Per Week/ Discharge Instructions: Use triamcinolone 15 (g) as directed Peri-Wound Care: Sween Lotion (Moisturizing lotion) 1 x Per Week/ Discharge Instructions: Apply moisturizing lotion as directed Prim Dressing:  Hydrofera Blue Classic Foam, 2x2 in 1 x Per Week/ ary Discharge Instructions: Moisten with saline prior to applying to wound bed Secondary Dressing: Woven Gauze Sponge, Non-Sterile 4x4 in 1 x Per Week/ Discharge Instructions: Apply over primary dressing as directed. Secondary Dressing: ABD Pad, 8x10 1 x Per Week/ Discharge Instructions: Apply over primary dressing as directed. Com pression Wrap: ThreePress (3 layer compression wrap) 1 x Per Week/ Discharge Instructions: Apply three layer compression as directed. 01/18/2022: right anterior lower leg wound continues to close. Good granulation tissue with minimal slough. No concern for infection. Slough easily debrided with a curette. Continue Hydrofera Blue and compression. Follow-up in 1 week. Electronic Signature(s) Signed: 01/18/2022 4:36:41 PM By: Fredirick Maudlin MD FACS Entered By: Fredirick Maudlin on 01/18/2022 16:36:40 -------------------------------------------------------------------------------- HxROS Details Patient Name: Date of Service: [name]. 01/18/2022 3:30 PM Medical Record Number: [id number] Patient Account Number: [id number] Date of Birth/Sex: Treating RN: 02/21/1932 (88 y.o. M) Primary Care Provider: [name] Other Clinician: Referring Provider: Treating Provider/Extender: Venancio Poisson Weeks in Treatment: 6 Information Obtained From Patient Eyes Medical History: Negative for: Cataracts; Glaucoma; Optic Neuritis Past Medical History Notes: ARMD Ear/Nose/Mouth/Throat Medical History: Negative for: Chronic sinus problems/congestion; Middle ear problems Hematologic/Lymphatic Medical History: Positive for: Anemia Negative for: Hemophilia; Human Immunodeficiency Virus; Lymphedema; Sickle Cell Disease Respiratory Medical History: Negative for: Aspiration; Asthma; Chronic Obstructive Pulmonary Disease (COPD); Pneumothorax; Sleep Apnea; Tuberculosis Cardiovascular Medical  History: Positive for: Arrhythmia - Afib; Congestive Heart Failure; Coronary Artery Disease; Hypertension; Myocardial Infarction - bypass 1996, 2006 Endocrine Medical History: Positive for: Type II Diabetes Time with diabetes: 10+yrs Treated with: Insulin Blood sugar tested every day: Yes Tested : once in morning Genitourinary Medical History: Negative for: End Stage Renal Disease Past Medical History Notes: CKD III Musculoskeletal Medical History: Positive for: Gout Neurologic Medical History: Positive for: Neuropathy Immunizations Pneumococcal Vaccine: Received Pneumococcal Vaccination: Yes Received Pneumococcal Vaccination On or After 60th Birthday: Yes Implantable Devices None Family and Social History Never smoker; Marital Status - Widowed; Alcohol Use: Never; Drug Use: No History; Caffeine Use: Never; Financial Concerns: No; Food, Clothing or Shelter Needs: No; Support System Lacking: No; Transportation Concerns: No Physician Affirmation I have reviewed and agree with the above information. Electronic Signature(s) Signed: 01/18/2022 5:26:37 PM By: Fredirick Maudlin MD FACS Entered By: Fredirick Maudlin on 01/18/2022 16:34:52 -------------------------------------------------------------------------------- SuperBill Details Patient Name: Date of Service: [name] 01/18/2022 Medical Record Number: [id number] Patient Account Number: [id number] Date of Birth/Sex: Treating RN: February 20, 1932 (88 y.o. M) Primary Care Provider: [name] Other Clinician: Referring Provider: Treating Provider/Extender: Venancio Poisson Weeks in Treatment: 6 Diagnosis Coding ICD-10 Codes Code Description [id number] Laceration without foreign body, right lower leg, subsequent encounter L97.818 Non-pressure chronic ulcer of other part of right lower leg with other specified severity I87.321 Chronic venous hypertension (idiopathic) with inflammation of right  lower extremity E11.42 Type 2 diabetes mellitus with diabetic  polyneuropathy Facility Procedures CPT4 Code: [id number] Description: [phone] - DEBRIDE WOUND 1ST 20 SQ CM OR < ICD-10 Diagnosis Description S81.811D Laceration without foreign body, right lower leg, subsequent encounter Modifier: Quantity: 1 Physician Procedures : CPT4 Code Description Modifier [id number] - WC PHYS DEBR WO ANESTH 20 SQ CM ICD-10 Diagnosis Description U44.034V Laceration without foreign body, right lower leg, subsequent encounter Quantity: 1 Electronic Signature(s) Signed: 01/18/2022 4:36:50 PM By: Fredirick Maudlin MD FACS Entered By: Fredirick Maudlin on 01/18/2022 16:36:50

## 2022-01-19 NOTE — Progress Notes (Signed)
[name], [name] ([id number]) Visit Report for 01/18/2022 Arrival Information Details Patient Name: Date of Service: CHIA, ROCK 01/18/2022 3:30 PM Medical Record Number: [id number] Patient Account Number: [id number] Date of Birth/Sex: Treating RN: 02/19/1932 (88 y.o. M) Primary Care Tavarus Poteete: [name] Other Clinician: Referring Baldemar Dady: Treating Breken Nazari/Extender: Venancio Poisson Weeks in Treatment: 6 Visit Information History Since Last Visit Added or deleted any medications: No Patient Arrived: Ambulatory Any new allergies or adverse reactions: No Arrival Time: 15:51 Had a fall or experienced change in No Accompanied By: wife activities of daily living that may affect Transfer Assistance: None risk of falls: Patient Identification Verified: Yes Signs or symptoms of abuse/neglect since last visito No Secondary Verification Process Completed: Yes Hospitalized since last visit: No Patient Requires Transmission-Based Precautions: No Implantable device outside of the clinic excluding No Patient Has Alerts: Yes cellular tissue based products placed in the center Patient Alerts: Patient on Blood Thinner since last visit: Has Dressing in Place as Prescribed: Yes Pain Present Now: Yes Electronic Signature(s) Signed: 01/19/2022 9:49:12 AM By: Sandre Kitty Entered By: Sandre Kitty on 01/18/2022 15:51:39 -------------------------------------------------------------------------------- Compression Therapy Details Patient Name: Date of Service: [name] 01/18/2022 3:30 PM Medical Record Number: [id number] Patient Account Number: [id number] Date of Birth/Sex: Treating RN: 09-Feb-1932 (88 y.o. [name] Primary Care Deanie Jupiter: [name] Other Clinician: Referring Abhimanyu Cruces: Treating Maryam Feely/Extender: Venancio Poisson Weeks in Treatment: 6 Compression Therapy Performed for Wound Assessment: Wound #1  Right,Anterior Lower Leg Performed By: Clinician Levan Hurst, RN Compression Type: Three Layer Post Procedure Diagnosis Same as Pre-procedure Electronic Signature(s) Signed: 01/18/2022 6:26:46 PM By: [name], [name] Entered By: Levan Hurst on 01/18/2022 16:33:48 -------------------------------------------------------------------------------- Encounter Discharge Information Details Patient Name: Date of Service: [name]. 01/18/2022 3:30 PM Medical Record Number: [id number] Patient Account Number: [id number] Date of Birth/Sex: Treating RN: 03-24-1932 (88 y.o. [name] Primary Care Shealyn Sean: [name] Other Clinician: Referring Tanelle Lanzo: Treating Mycala Warshawsky/Extender: Venancio Poisson Weeks in Treatment: 6 Encounter Discharge Information Items Post Procedure Vitals Discharge Condition: Stable Temperature (F): 98.4 Ambulatory Status: Ambulatory Pulse (bpm): 65 Discharge Destination: Home Respiratory Rate (breaths/min): 18 Transportation: Private Auto Blood Pressure (mmHg): 134/76 Accompanied By: daughter Schedule Follow-up Appointment: Yes Clinical Summary of Care: Patient Declined Electronic Signature(s) Signed: 01/18/2022 6:26:46 PM By: [name], [name] Entered By: Levan Hurst on 01/18/2022 17:50:52 -------------------------------------------------------------------------------- Lower Extremity Assessment Details Patient Name: Date of Service: [name]. 01/18/2022 3:30 PM Medical Record Number: [id number] Patient Account Number: [id number] Date of Birth/Sex: Treating RN: 22-Jun-1932 (89 y.o. [name] Primary Care Mearl Harewood: [name] Other Clinician: Referring Fannie Gathright: Treating Laci Frenkel/Extender: Venancio Poisson Weeks in Treatment: 6 Edema Assessment Assessed: Shirlyn Goltz: No] Patrice Paradise: No] E[Left: dema] [Right: :] Calf Left: Right: Point of Measurement: 33 cm From  Medial Instep 28 cm Ankle Left: Right: Point of Measurement: 11 cm From Medial Instep 21.8 cm Vascular Assessment Pulses: Dorsalis Pedis Palpable: [Right:Yes] Electronic Signature(s) Signed: 01/18/2022 6:26:46 PM By: [name], [name] Entered By: Levan Hurst on 01/18/2022 16:26:00 -------------------------------------------------------------------------------- Multi Wound Chart Details Patient Name: Date of Service: [name]. 01/18/2022 3:30 PM Medical Record Number: [id number] Patient Account Number: [id number] Date of Birth/Sex: Treating RN: Sep 05, 1932 (88 y.o. M) Primary Care Rue Tinnel: [name] Other Clinician: Referring Dannell Raczkowski: Treating Wilian Kwong/Extender: Venancio Poisson Weeks in Treatment: 6 Vital Signs Height(in): 68 Pulse(bpm): 93 Weight(lbs): 161 Blood Pressure(mmHg): 134/76 Body Mass  Index(BMI): 24.5 Temperature(F): 98.4 Respiratory Rate(breaths/min): 18 Photos: [N/A:N/A] Right, Anterior Lower Leg N/A N/A Wound Location: Trauma N/A N/A Wounding Event: Venous Leg Ulcer N/A N/A Primary Etiology: Anemia, Arrhythmia, Congestive Heart N/A N/A Comorbid History: Failure, Coronary Artery Disease, Hypertension, Myocardial Infarction, Type II Diabetes, Gout, Neuropathy 11/18/2021 N/A N/A Date Acquired: 6 N/A N/A Weeks of Treatment: Open N/A N/A Wound Status: No N/A N/A Wound Recurrence: 3x0.5x0.1 N/A N/A Measurements L x W x D (cm) 1.178 N/A N/A A (cm) : rea 0.118 N/A N/A Volume (cm) : 93.60% N/A N/A % Reduction in A rea: 93.60% N/A N/A % Reduction in Volume: Full Thickness Without Exposed N/A N/A Classification: Support Structures Medium N/A N/A Exudate A mount: Serosanguineous N/A N/A Exudate Type: red, brown N/A N/A Exudate Color: Flat and Intact N/A N/A Wound Margin: Large (67-100%) N/A N/A Granulation A mount: Red N/A N/A Granulation Quality: Small (1-33%) N/A N/A Necrotic A mount: Fat  Layer (Subcutaneous Tissue): Yes N/A N/A Exposed Structures: Fascia: No Tendon: No Muscle: No Joint: No Bone: No Small (1-33%) N/A N/A Epithelialization: Debridement - Selective/Open Wound N/A N/A Debridement: Pre-procedure Verification/Time Out 16:31 N/A N/A Taken: Slough N/A N/A Tissue Debrided: Non-Viable Tissue N/A N/A Level: 1.5 N/A N/A Debridement A (sq cm): rea Curette N/A N/A Instrument: Minimum N/A N/A Bleeding: Pressure N/A N/A Hemostasis A chieved: 0 N/A N/A Procedural Pain: 0 N/A N/A Post Procedural Pain: Procedure was tolerated well N/A N/A Debridement Treatment Response: 3x0.5x0.1 N/A N/A Post Debridement Measurements L x W x D (cm) 0.118 N/A N/A Post Debridement Volume: (cm) Compression Therapy N/A N/A Procedures Performed: Debridement Treatment Notes Electronic Signature(s) Signed: 01/18/2022 4:34:03 PM By: Fredirick Maudlin MD FACS Signed: 01/18/2022 4:34:03 PM By: Fredirick Maudlin MD FACS Entered By: Fredirick Maudlin on 01/18/2022 16:34:02 -------------------------------------------------------------------------------- Multi-Disciplinary Care Plan Details Patient Name: Date of Service: [name]. 01/18/2022 3:30 PM Medical Record Number: [id number] Patient Account Number: [id number] Date of Birth/Sex: Treating RN: May 25, 1932 (88 y.o. [name], [name] Primary Care Aldridge Krzyzanowski: [name] Other Clinician: Referring Beckett Maden: Treating Eames Dibiasio/Extender: Meredith Leeds in Treatment: 6 Multidisciplinary Care Plan reviewed with physician Active Inactive Abuse / Safety / Falls / Self Care Management Nursing Diagnoses: History of Falls Potential for injury related to falls Goals: Patient will not experience any injury related to falls Date Initiated: 12/07/2021 Target Resolution Date: 02/01/2022 Goal Status: Active Patient/caregiver will verbalize/demonstrate measures taken to prevent injury and/or  falls Date Initiated: 12/07/2021 Target Resolution Date: 02/01/2022 Goal Status: Active Interventions: Assess Activities of Daily Living upon admission and as needed Assess fall risk on admission and as needed Assess: immobility, friction, shearing, incontinence upon admission and as needed Assess impairment of mobility on admission and as needed per policy Assess personal safety and home safety (as indicated) on admission and as needed Assess self care needs on admission and as needed Provide education on fall prevention Provide education on personal and home safety Notes: Nutrition Nursing Diagnoses: Impaired glucose control: actual or potential Potential for alteratiion in Nutrition/Potential for imbalanced nutrition Goals: Patient/caregiver agrees to and verbalizes understanding of need to use nutritional supplements and/or vitamins as prescribed Date Initiated: 12/07/2021 Target Resolution Date: 02/01/2022 Goal Status: Active Patient/caregiver will maintain therapeutic glucose control Date Initiated: 12/07/2021 Target Resolution Date: 02/01/2022 Goal Status: Active Interventions: Assess HgA1c results as ordered upon admission and as needed Assess patient nutrition upon admission and as needed per policy Provide education on elevated blood sugars and impact on wound healing Provide education  on nutrition Treatment Activities: Education provided on Nutrition : 12/07/2021 Notes: Wound/Skin Impairment Nursing Diagnoses: Impaired tissue integrity Knowledge deficit related to ulceration/compromised skin integrity Goals: Patient/caregiver will verbalize understanding of skin care regimen Date Initiated: 12/07/2021 Target Resolution Date: 02/01/2022 Goal Status: Active Ulcer/skin breakdown will have a volume reduction of 30% by week 4 Date Initiated: 12/07/2021 Target Resolution Date: 02/01/2022 Goal Status: Active Interventions: Assess patient/caregiver ability to obtain necessary  supplies Assess patient/caregiver ability to perform ulcer/skin care regimen upon admission and as needed Assess ulceration(s) every visit Provide education on ulcer and skin care Notes: Electronic Signature(s) Signed: 01/18/2022 6:26:46 PM By: [name], [name] Entered By: Levan Hurst on 01/18/2022 17:49:50 -------------------------------------------------------------------------------- Pain Assessment Details Patient Name: Date of Service: [name] 01/18/2022 3:30 PM Medical Record Number: [id number] Patient Account Number: [id number] Date of Birth/Sex: Treating RN: 15-Mar-1932 (88 y.o. M) Primary Care Chalyn Amescua: [name] Other Clinician: Referring Lameshia Hypolite: Treating Teigen Parslow/Extender: Venancio Poisson Weeks in Treatment: 6 Active Problems Location of Pain Severity and Description of Pain Patient Has Paino Yes Site Locations Rate the pain. Current Pain Level: 5 Pain Management and Medication Current Pain Management: Electronic Signature(s) Signed: 01/19/2022 9:49:12 AM By: Sandre Kitty Entered By: Sandre Kitty on 01/18/2022 15:52:14 -------------------------------------------------------------------------------- Patient/Caregiver Education Details Patient Name: Date of Service: [name] 3/7/2023andnbsp3:30 PM Medical Record Number: [id number] Patient Account Number: [id number] Date of Birth/Gender: Treating RN: 01/27/1932 (89 y.o. [name] Primary Care Physician: [name] Other Clinician: Referring Physician: Treating Physician/Extender: Meredith Leeds in Treatment: 6 Education Assessment Education Provided To: Patient Education Topics Provided Wound/Skin Impairment: Methods: Explain/Verbal Responses: State content correctly Electronic Signature(s) Signed: 01/18/2022 6:26:46 PM By: [name], [name] Entered By: Levan Hurst on 01/18/2022  17:50:03 -------------------------------------------------------------------------------- Wound Assessment Details Patient Name: Date of Service: [name]. 01/18/2022 3:30 PM Medical Record Number: [id number] Patient Account Number: [id number] Date of Birth/Sex: Treating RN: 1931/12/06 (89 y.o. M) Primary Care Evana Runnels: [name] Other Clinician: Referring Giannamarie Paulus: Treating Hae Ahlers/Extender: Venancio Poisson Weeks in Treatment: 6 Wound Status Wound Number: 1 Primary Venous Leg Ulcer Etiology: Wound Location: Right, Anterior Lower Leg Wound Open Wounding Event: Trauma Status: Date Acquired: 11/18/2021 Comorbid Anemia, Arrhythmia, Congestive Heart Failure, Coronary Artery Weeks Of Treatment: 6 History: Disease, Hypertension, Myocardial Infarction, Type II Diabetes, Clustered Wound: No Gout, Neuropathy Photos Wound Measurements Length: (cm) 3 Width: (cm) 0.5 Depth: (cm) 0.1 Area: (cm) 1.178 Volume: (cm) 0.118 % Reduction in Area: 93.6% % Reduction in Volume: 93.6% Epithelialization: Small (1-33%) Tunneling: No Undermining: No Wound Description Classification: Full Thickness Without Exposed Support Structures Wound Margin: Flat and Intact Exudate Amount: Medium Exudate Type: Serosanguineous Exudate Color: red, brown Foul Odor After Cleansing: No Slough/Fibrino Yes Wound Bed Granulation Amount: Large (67-100%) Exposed Structure Granulation Quality: Red Fascia Exposed: No Necrotic Amount: Small (1-33%) Fat Layer (Subcutaneous Tissue) Exposed: Yes Necrotic Quality: Adherent Slough Tendon Exposed: No Muscle Exposed: No Joint Exposed: No Bone Exposed: No Treatment Notes Wound #1 (Lower Leg) Wound Laterality: Right, Anterior Cleanser Soap and Water Discharge Instruction: May shower and wash wound with dial antibacterial soap and water prior to dressing change. Wound Cleanser Discharge Instruction: Cleanse the wound with wound  cleanser prior to applying a clean dressing using gauze sponges, not tissue or cotton balls. Peri-Wound Care Triamcinolone 15 (g) Discharge Instruction: Use triamcinolone 15 (g) as directed Sween Lotion (Moisturizing lotion) Discharge Instruction: Apply moisturizing lotion as directed Topical Primary Dressing Hydrofera  Blue Classic Foam, 2x2 in Discharge Instruction: Moisten with saline prior to applying to wound bed Secondary Dressing Woven Gauze Sponge, Non-Sterile 4x4 in Discharge Instruction: Apply over primary dressing as directed. ABD Pad, 8x10 Discharge Instruction: Apply over primary dressing as directed. Secured With Compression Wrap ThreePress (3 layer compression wrap) Discharge Instruction: Apply three layer compression as directed. Compression Stockings Add-Ons Electronic Signature(s) Signed: 01/18/2022 6:26:46 PM By: [name], [name] Entered By: Levan Hurst on 01/18/2022 16:25:49 -------------------------------------------------------------------------------- Vitals Details Patient Name: Date of Service: [name]. 01/18/2022 3:30 PM Medical Record Number: [id number] Patient Account Number: [id number] Date of Birth/Sex: Treating RN: [birth year] (88 y.o. M) Primary Care Ociel Retherford: [name] Other Clinician: Referring Jamere Stidham: Treating Armando Lauman/Extender: Venancio Poisson Weeks in Treatment: 6 Vital Signs Time Taken: 13:51 Temperature (F): 98.4 Height (in): 68 Pulse (bpm): 65 Weight (lbs): 161 Respiratory Rate (breaths/min): 18 Body Mass Index (BMI): 24.5 Blood Pressure (mmHg): 134/76 Reference Range: 80 - 120 mg / dl Electronic Signature(s) Signed: 01/19/2022 9:49:12 AM By: Sandre Kitty Entered By: Sandre Kitty on 01/18/2022 15:52:01

## 2022-01-21 NOTE — Progress Notes (Signed)
[name], [name] ([id number]) Visit Report for 01/11/2022 HPI Details Patient Name: Date of Service: [name], [name] 01/11/2022 10:15 A M Medical Record Number: [id number] Patient Account Number: [id number] Date of Birth/Sex: Treating RN: [birth year] (88 y.o. Collene Gobble Primary Care Provider: [name] Other Clinician: Referring Provider: Treating Provider/Extender: [name], [name] Weeks in Treatment: 5 History of Present Illness HPI Description: ADMISSION 12/07/2021 This is a pleasant 88 year old man who arrives in clinic accompanied by his daughter for review of a traumatic wound on the right leg that happened on 11/18/2021. This was a result of a fall he had at home. He was seen in the ER on the same day and had 7 sutures placed. Ultimately the sutures were removed by the family physician on 1/16 however the wound dehisced. They stated that it was hard to get the original dressing off that he had not changed as well. It was felt to be infected. He was given Rocephin and subsequently doxycycline which he finished yesterday. A culture at that time showed methicillin sensitive staph aureus. He comes in the clinic today with eschar over the entire wound surface. There is erythema around the wound but he says he had some of that even before this happened. He quite clearly has chronic venous insufficiency bilaterally. Past medical history includes basal cell CA of the skin, hypertension, chronic kidney disease, heart failure with preserved ejection fraction, coronary artery disease, carotid stenosis, atrial fib on Eliquis, history of vein stripping in the right leg, type 2 diabetes on insulin and mitral valve regurgitation ABI in our clinic was 0.92 on the right 2/21; continued nice improvements in this wound. Healthy granulation using Hydrofera Blue under 3 lack compression. 1/31; wound looks better this week smaller still requiring debridement. We will use  silver alginate under compression 2/7; wound looks better no debridement is required I changed him to [facility] today still under compression 2/14; continued nice improvement Hydrofera Blue under compression 2/28; continued improvement in wound measurements using Hydrofera Blue and compression. This wound looks like it's progressing towards closure initially a large traumatic wound in the setting of chronic venous insufficiency Electronic Signature(s) Signed: 01/11/2022 4:13:40 PM By: [name] MD Entered By: [name] on 01/11/2022 11:20:44 -------------------------------------------------------------------------------- Physical Exam Details Patient Name: Date of Service: [name] 01/11/2022 10:15 A M Medical Record Number: [id number] Patient Account Number: [id number] Date of Birth/Sex: Treating RN: 1932-07-25 (88 y.o. Collene Gobble Primary Care Provider: [name] Other Clinician: Referring Provider: Treating Provider/Extender: Earnest Conroy Weeks in Treatment: 5 Constitutional Patient is hypertensive.. Pulse regular and within target range for patient.[name] Respirations regular, non-labored and within target range.. Temperature is normal and within the target range for the patient.[name] Appears in no distress. Notes exam; right anterior lower leg. Healthy granulation. Some dry eschar around the circumference of the wound washed off with saline and gauze. The wound is gradually progressing towards closure. No evidence of surrounding infection Electronic Signature(s) Signed: 01/11/2022 4:13:40 PM By: [name] MD Entered By: [name] on 01/11/2022 11:21:42 -------------------------------------------------------------------------------- Physician Orders Details Patient Name: Date of Service: [name]. 01/11/2022 10:15 A M Medical Record Number: [id number] Patient Account Number: [id number] Date of  Birth/Sex: Treating RN: 1932/01/13 (88 y.o. Collene Gobble Primary Care Provider: [name] Other Clinician: Referring Provider: Treating Provider/Extender: Earnest Conroy Weeks in Treatment: 5 Verbal / Phone Orders: No Diagnosis Coding Follow-up Appointments ppointment in 1 week. -  Dr. Dellia Nims Dr Celine Ahr Return A Bathing/ Shower/ Hygiene May shower with protection but do not get wound dressing(s) wet. - Ok to use Market researcher, can purchase at [facility], [facility], or [employer] Edema Control - Lymphedema / SCD / Other Elevate legs to the level of the heart or above for 30 minutes daily and/or when sitting, a frequency of: - throughout the day Avoid standing for long periods of time. Exercise regularly Moisturize legs daily. - left leg Wound Treatment Wound #1 - Lower Leg Wound Laterality: Right, Anterior Cleanser: Soap and Water 1 x Per Week Discharge Instructions: May shower and wash wound with dial antibacterial soap and water prior to dressing change. Cleanser: Wound Cleanser 1 x Per Week Discharge Instructions: Cleanse the wound with wound cleanser prior to applying a clean dressing using gauze sponges, not tissue or cotton balls. Peri-Wound Care: Triamcinolone 15 (g) 1 x Per Week Discharge Instructions: Use triamcinolone 15 (g) as directed Peri-Wound Care: Sween Lotion (Moisturizing lotion) 1 x Per Week Discharge Instructions: Apply moisturizing lotion as directed Prim Dressing: Hydrofera Blue Classic Foam, 2x2 in 1 x Per Week ary Discharge Instructions: Moisten with saline prior to applying to wound bed Secondary Dressing: Woven Gauze Sponge, Non-Sterile 4x4 in 1 x Per Week Discharge Instructions: Apply over primary dressing as directed. Secondary Dressing: ABD Pad, 8x10 1 x Per Week Discharge Instructions: Apply over primary dressing as directed. Compression Wrap: ThreePress (3 layer compression wrap) 1 x Per Week Discharge Instructions: Apply  three layer compression as directed. Electronic Signature(s) Signed: 01/11/2022 4:13:40 PM By: [name] MD Signed: 01/11/2022 6:17:08 PM By: Dellie Catholic RN Entered By: Dellie Catholic on 01/11/2022 11:15:06 -------------------------------------------------------------------------------- Problem List Details Patient Name: Date of Service: [name]. 01/11/2022 10:15 A M Medical Record Number: [id number] Patient Account Number: [id number] Date of Birth/Sex: Treating RN: 01-21-1932 (88 y.o. Collene Gobble Primary Care Provider: [name] Other Clinician: Referring Provider: Treating Provider/Extender: [name], [name] Weeks in Treatment: 5 Active Problems ICD-10 Encounter Code Description Active Date MDM Diagnosis S81.811D Laceration without foreign body, right lower leg, subsequent encounter 12/07/2021 No Yes L97.818 Non-pressure chronic ulcer of other part of right lower leg with other specified 12/07/2021 No Yes severity I87.321 Chronic venous hypertension (idiopathic) with inflammation of right lower 12/07/2021 No Yes extremity E11.42 Type 2 diabetes mellitus with diabetic polyneuropathy 12/07/2021 No Yes Inactive Problems Resolved Problems Electronic Signature(s) Signed: 01/11/2022 4:13:40 PM By: [name] MD Entered By: [name] on 01/11/2022 11:19:35 -------------------------------------------------------------------------------- Progress Note Details Patient Name: Date of Service: [name]. 01/11/2022 10:15 A M Medical Record Number: [id number] Patient Account Number: [id number] Date of Birth/Sex: Treating RN: 06-20-1932 (88 y.o. Collene Gobble Primary Care Provider: [name] Other Clinician: Referring Provider: Treating Provider/Extender: [name], [name] Weeks in Treatment: 5 Subjective History of Present Illness (HPI) ADMISSION 12/07/2021 This is a pleasant  88 year old man who arrives in clinic accompanied by his daughter for review of a traumatic wound on the right leg that happened on 11/18/2021. This was a result of a fall he had at home. He was seen in the ER on the same day and had 7 sutures placed. Ultimately the sutures were removed by the family physician on 1/16 however the wound dehisced. They stated that it was hard to get the original dressing off that he had not changed as well. It was felt to be infected. He was given Rocephin and subsequently doxycycline which he finished yesterday. A culture  at that time showed methicillin sensitive staph aureus. He comes in the clinic today with eschar over the entire wound surface. There is erythema around the wound but he says he had some of that even before this happened. He quite clearly has chronic venous insufficiency bilaterally. Past medical history includes basal cell CA of the skin, hypertension, chronic kidney disease, heart failure with preserved ejection fraction, coronary artery disease, carotid stenosis, atrial fib on Eliquis, history of vein stripping in the right leg, type 2 diabetes on insulin and mitral valve regurgitation ABI in our clinic was 0.92 on the right 2/21; continued nice improvements in this wound. Healthy granulation using Hydrofera Blue under 3 lack compression. 1/31; wound looks better this week smaller still requiring debridement. We will use silver alginate under compression 2/7; wound looks better no debridement is required I changed him to [facility] today still under compression 2/14; continued nice improvement Hydrofera Blue under compression 2/28; continued improvement in wound measurements using Hydrofera Blue and compression. This wound looks like it's progressing towards closure initially a large traumatic wound in the setting of chronic venous insufficiency Objective Constitutional Patient is hypertensive.. Pulse regular and within target range for  patient.[name] Respirations regular, non-labored and within target range.. Temperature is normal and within the target range for the patient.[name] Appears in no distress. Vitals Time Taken: 10:41 AM, Height: 68 in, Weight: 161 lbs, BMI: 24.5, Temperature: 98.3 F, Pulse: 64 bpm, Respiratory Rate: 18 breaths/min, Blood Pressure: 152/75 mmHg. General Notes: exam; right anterior lower leg. Healthy granulation. Some dry eschar around the circumference of the wound washed off with saline and gauze. The wound is gradually progressing towards closure. No evidence of surrounding infection Integumentary (Hair, Skin) Wound #1 status is Open. Original cause of wound was Trauma. The date acquired was: 11/18/2021. The wound has been in treatment 5 weeks. The wound is located on the Right,Anterior Lower Leg. The wound measures 3.9cm length x 0.6cm width x 0.2cm depth; 1.838cm^2 area and 0.368cm^3 volume. There is Fat Layer (Subcutaneous Tissue) exposed. There is no tunneling or undermining noted. There is a medium amount of serosanguineous drainage noted. The wound margin is flat and intact. There is large (67-100%) red granulation within the wound bed. There is a small (1-33%) amount of necrotic tissue within the wound bed including Adherent Slough. Assessment Active Problems ICD-10 Laceration without foreign body, right lower leg, subsequent encounter Non-pressure chronic ulcer of other part of right lower leg with other specified severity Chronic venous hypertension (idiopathic) with inflammation of right lower extremity Type 2 diabetes mellitus with diabetic polyneuropathy Plan Follow-up Appointments: Return Appointment in 1 week. - Dr. Dellia Nims Dr Celine Ahr Bathing/ Shower/ Hygiene: May shower with protection but do not get wound dressing(s) wet. - Ok to use Market researcher, can purchase at [facility], [facility], or [employer] Edema Control - Lymphedema / SCD / Other: Elevate legs to the level of the heart or above for 30  minutes daily and/or when sitting, a frequency of: - throughout the day Avoid standing for long periods of time. Exercise regularly Moisturize legs daily. - left leg WOUND #1: - Lower Leg Wound Laterality: Right, Anterior Cleanser: Soap and Water 1 x Per Week/ Discharge Instructions: May shower and wash wound with dial antibacterial soap and water prior to dressing change. Cleanser: Wound Cleanser 1 x Per Week/ Discharge Instructions: Cleanse the wound with wound cleanser prior to applying a clean dressing using gauze sponges, not tissue or cotton balls. Peri-Wound Care: Triamcinolone 15 (g) 1  x Per Week/ Discharge Instructions: Use triamcinolone 15 (g) as directed Peri-Wound Care: Sween Lotion (Moisturizing lotion) 1 x Per Week/ Discharge Instructions: Apply moisturizing lotion as directed Prim Dressing: Hydrofera Blue Classic Foam, 2x2 in 1 x Per Week/ ary Discharge Instructions: Moisten with saline prior to applying to wound bed Secondary Dressing: Woven Gauze Sponge, Non-Sterile 4x4 in 1 x Per Week/ Discharge Instructions: Apply over primary dressing as directed. Secondary Dressing: ABD Pad, 8x10 1 x Per Week/ Discharge Instructions: Apply over primary dressing as directed. Compression Wrap: ThreePress (3 layer compression wrap) 1 x Per Week/ Discharge Instructions: Apply three layer compression as directed. #1 we continue to use Hydrofera Blue under compression #2 granulation tissue looks healthy. Electronic Signature(s) Signed: 01/11/2022 4:13:40 PM By: [name] MD Entered By: [name] on 01/11/2022 11:23:04 -------------------------------------------------------------------------------- SuperBill Details Patient Name: Date of Service: [name], [name] 01/11/2022 Medical Record Number: [id number] Patient Account Number: [id number] Date of Birth/Sex: Treating RN: Mar 09, 1932 (88 y.o. Collene Gobble Primary Care Provider: [name] Other  Clinician: Referring Provider: Treating Provider/Extender: [name], [name] Weeks in Treatment: 5 Diagnosis Coding ICD-10 Codes Code Description [phone] Laceration without foreign body, right lower leg, subsequent encounter L97.818 Non-pressure chronic ulcer of other part of right lower leg with other specified severity I87.321 Chronic venous hypertension (idiopathic) with inflammation of right lower extremity E11.42 Type 2 diabetes mellitus with diabetic polyneuropathy Facility Procedures CPT4 Code: [id number] Description: (Facility Use Only) [phone] - [facility] LWR RT LEG Modifier: Quantity: 1 Physician Procedures : CPT4 Code Description Modifier [id number] - WC PHYS LEVEL 3 - EST PT ICD-10 Diagnosis Description S81.811D Laceration without foreign body, right lower leg, subsequent encounter L97.818 Non-pressure chronic ulcer of other part of right lower leg  with other specified severity Quantity: 1 Electronic Signature(s) Signed: 01/11/2022 6:17:08 PM By: Dellie Catholic RN Signed: 01/21/2022 12:53:18 PM By: [name] MD Previous Signature: 01/11/2022 4:13:40 PM Version By: [name] MD Entered By: Dellie Catholic on 01/11/2022 18:10:43

## 2022-01-25 ENCOUNTER — Other Ambulatory Visit: Payer: Self-pay

## 2022-01-25 ENCOUNTER — Encounter (HOSPITAL_BASED_OUTPATIENT_CLINIC_OR_DEPARTMENT_OTHER): Payer: Medicare Other | Admitting: General Surgery

## 2022-01-25 ENCOUNTER — Telehealth: Payer: Self-pay | Admitting: *Deleted

## 2022-01-25 DIAGNOSIS — I872 Venous insufficiency (chronic) (peripheral): Secondary | ICD-10-CM | POA: Diagnosis not present

## 2022-01-25 DIAGNOSIS — I87331 Chronic venous hypertension (idiopathic) with ulcer and inflammation of right lower extremity: Secondary | ICD-10-CM | POA: Diagnosis not present

## 2022-01-25 DIAGNOSIS — N189 Chronic kidney disease, unspecified: Secondary | ICD-10-CM | POA: Diagnosis not present

## 2022-01-25 DIAGNOSIS — L97818 Non-pressure chronic ulcer of other part of right lower leg with other specified severity: Secondary | ICD-10-CM | POA: Diagnosis not present

## 2022-01-25 DIAGNOSIS — E1122 Type 2 diabetes mellitus with diabetic chronic kidney disease: Secondary | ICD-10-CM | POA: Diagnosis not present

## 2022-01-25 DIAGNOSIS — I130 Hypertensive heart and chronic kidney disease with heart failure and stage 1 through stage 4 chronic kidney disease, or unspecified chronic kidney disease: Secondary | ICD-10-CM | POA: Diagnosis not present

## 2022-01-25 DIAGNOSIS — L97812 Non-pressure chronic ulcer of other part of right lower leg with fat layer exposed: Secondary | ICD-10-CM | POA: Diagnosis not present

## 2022-01-25 DIAGNOSIS — E1142 Type 2 diabetes mellitus with diabetic polyneuropathy: Secondary | ICD-10-CM | POA: Diagnosis not present

## 2022-01-25 NOTE — Chronic Care Management (AMB) (Signed)
?  Chronic Care Management  ? ?Outreach Note ? ?01/25/2022 ?Name: [name] MRN: [id number] DOB: [birth year] ? ?[name] is a 88 y.o. year old male who is a primary care patient of Tonia Ghent, MD. I reached out to [name] by phone today in response to a referral sent by [name]'s primary care provider. ? ?An unsuccessful telephone outreach was attempted today. The patient was referred to the case management team for assistance with care management and care coordination.  ? ?Follow Up Plan: A HIPAA compliant phone message was left for the patient providing contact information and requesting a return call.  ? ?[name], CCMA ?Care Guide, Embedded Care Coordination ?[name]  Care Management  ?Direct Dial: [phone] ? ? ?

## 2022-01-26 NOTE — Progress Notes (Signed)
TYRIAN, PEART ([id number]) ?Visit Report for 01/25/2022 ?Chief Complaint Document Details ?Patient Name: Date of Service: ?Schmelzle, [name] 01/25/2022 10:30 A M ?Medical Record Number: [id number] ?Patient Account Number: [id number] ?Date of Birth/Sex: Treating RN: ?1932/02/16 (88 y.o. M) ?Primary Care Provider: [name] Other Clinician: ?Referring Provider: ?Treating Provider/Extender: Fredirick Maudlin ?[name] ?Weeks in Treatment: 7 ?Information Obtained from: Patient ?Chief Complaint ?12/07/2021; patient comes in for review of a wound on his right anterior lower leg which was initially traumatic ?Electronic Signature(s) ?Signed: 01/25/2022 5:30:24 PM By: Fredirick Maudlin MD FACS ?Entered By: Fredirick Maudlin on 01/25/2022 17:30:24 ?-------------------------------------------------------------------------------- ?Debridement Details ?Patient Name: Date of Service: ?Shroff, [name] 01/25/2022 10:30 A M ?Medical Record Number: [id number] ?Patient Account Number: [id number] ?Date of Birth/Sex: Treating RN: ?[birth year] (88 y.o. Jerilynn Mages) Dellie Catholic ?Primary Care Provider: [name] Other Clinician: ?Referring Provider: ?Treating Provider/Extender: Fredirick Maudlin ?[name] ?Weeks in Treatment: 7 ?Debridement Performed for Assessment: Wound #1 Right,Anterior Lower Leg ?Performed By: Physician Fredirick Maudlin, MD ?Debridement Type: Debridement ?Severity of Tissue Pre Debridement: Necrosis of muscle ?Level of Consciousness (Pre-procedure): Awake and Alert ?Pre-procedure Verification/Time Out Yes - 11:28 ?Taken: ?Start Time: 11:28 ?Pain Control: ?Other : Benzocaine ?T Area Debrided (L x W): ?otal 2.5 (cm) x 0.3 (cm) = 0.75 (cm?) ?Tissue and other material debrided: Non-Viable, Slough, Subcutaneous, [place] ?Level: Skin/Subcutaneous Tissue ?Debridement Description: Excisional ?Instrument: Curette ?Bleeding: Minimum ?Hemostasis Achieved: Pressure ?End Time: 11:29 ?Procedural Pain: 0 ?Post  Procedural Pain: 0 ?Response to Treatment: Procedure was tolerated well ?Level of Consciousness (Post- Awake and Alert ?procedure): ?Post Debridement Measurements of Total Wound ?Length: (cm) 2.5 ?Width: (cm) 0.3 ?Depth: (cm) 0.1 ?Volume: (cm?) 0.059 ?Character of Wound/Ulcer Post Debridement: Improved ?Severity of Tissue Post Debridement: Fat layer exposed ?Post Procedure Diagnosis ?Same as Pre-procedure ?Electronic Signature(s) ?Signed: 01/25/2022 5:46:17 PM By: Dellie Catholic RN ?Signed: 01/26/2022 6:42:44 PM By: Fredirick Maudlin MD FACS ?Entered By: Dellie Catholic on 01/25/2022 11:32:59 ?-------------------------------------------------------------------------------- ?HPI Details ?Patient Name: Date of Service: ?Orellana, [name] 01/25/2022 10:30 A M ?Medical Record Number: [id number] ?Patient Account Number: [id number] ?Date of Birth/Sex: Treating RN: ?1932/10/17 (88 y.o. M) ?Primary Care Provider: [name] Other Clinician: ?Referring Provider: ?Treating Provider/Extender: Fredirick Maudlin ?[name] ?Weeks in Treatment: 7 ?History of Present Illness ?HPI Description: ADMISSION ?12/07/2021 ?This is a pleasant 88 year old man who arrives in clinic accompanied by his daughter for review of a traumatic wound on the right leg that happened on ?11/18/2021. This was a result of a fall he had at home. He was seen in the ER on the same day and had 7 sutures placed. Ultimately the sutures were removed ?by the family physician on 1/16 however the wound dehisced. They stated that it was hard to get the original dressing off that he had not changed as well. It ?was felt to be infected. He was given Rocephin and subsequently doxycycline which he finished yesterday. A culture at that time showed methicillin sensitive ?staph aureus. ?He comes in the clinic today with eschar over the entire wound surface. There is erythema around the wound but he says he had some of that even before ?this happened. He quite  clearly has chronic venous insufficiency bilaterally. ?Past medical history includes basal cell CA of the skin, hypertension, chronic kidney disease, heart failure with preserved ejection fraction, coronary artery ?disease, carotid stenosis, atrial fib on Eliquis, history of vein stripping in the right leg, type 2 diabetes on insulin and mitral valve regurgitation ?ABI in our  clinic was 0.92 on the right ?2/21; continued nice improvements in this wound. Healthy granulation using Hydrofera Blue under 3 lack compression. ?1/31; wound looks better this week smaller still requiring debridement. We will use silver alginate under compression ?2/7; wound looks better no debridement is required I changed him to [facility] today still under compression ?2/14; continued nice improvement Hydrofera Blue under compression ?2/28; continued improvement in wound measurements using Hydrofera Blue and compression. This wound looks like it's progressing towards closure initially a ?large traumatic wound in the setting of chronic venous insufficiency ?01/18/2022: Continued improvement with Hydrofera Blue and compression. Minimal debridement required. ?01/25/2022: Continued decrease in wound dimensions. It is very nearly closed. He has been in [facility] with 3 layer compression. ?Electronic Signature(s) ?Signed: 01/25/2022 5:30:58 PM By: Fredirick Maudlin MD FACS ?Entered By: Fredirick Maudlin on 01/25/2022 17:30:58 ?-------------------------------------------------------------------------------- ?Physical Exam Details ?Patient Name: Date of Service: ?Bonifield, [name] 01/25/2022 10:30 A M ?Medical Record Number: [id number] ?Patient Account Number: [id number] ?Date of Birth/Sex: Treating RN: ?1932/01/07 (88 y.o. M) ?Primary Care Provider: [name] Other Clinician: ?Referring Provider: ?Treating Provider/Extender: Fredirick Maudlin ?[name] ?Weeks in Treatment: 7 ?Constitutional ?. . . . No acute  distress. ?Respiratory ?Normal work of breathing on room air. ?Notes ?01/25/2022: Continued decrease in size of the right anterior lower leg wound. Minimal slough, no concern for infection. ?Electronic Signature(s) ?Signed: 01/25/2022 5:32:36 PM By: Fredirick Maudlin MD FACS ?Entered By: Fredirick Maudlin on 01/25/2022 17:32:36 ?-------------------------------------------------------------------------------- ?Physician Orders Details ?Patient Name: Date of Service: ?Milich, [name] 01/25/2022 10:30 A M ?Medical Record Number: [id number] ?Patient Account Number: [id number] ?Date of Birth/Sex: Treating RN: ?20-Oct-1932 (88 y.o. Jerilynn Mages) Dellie Catholic ?Primary Care Provider: [name] Other Clinician: ?Referring Provider: ?Treating Provider/Extender: Fredirick Maudlin ?[name] ?Weeks in Treatment: 7 ?Verbal / Phone Orders: No ?Diagnosis Coding ?ICD-10 Coding ?Code Description ?E33.295J Laceration without foreign body, right lower leg, subsequent encounter ?L97.818 Non-pressure chronic ulcer of other part of right lower leg with other specified severity ?O84.166 Chronic venous hypertension (idiopathic) with inflammation of right lower extremity ?E11.42 Type 2 diabetes mellitus with diabetic polyneuropathy ?Follow-up Appointments ?ppointment in 1 week. - Dr Celine Ahr ?Return A ?Bathing/ Shower/ Hygiene ?May shower with protection but do not get wound dressing(s) wet. - Ok to use Market researcher, can purchase at [facility], [place], or [place] ?Edema Control - Lymphedema / SCD / Other ?Elevate legs to the level of the heart or above for 30 minutes daily and/or when sitting, a frequency of: - throughout the day ?Avoid standing for long periods of time. ?Exercise regularly ?Moisturize legs daily. - left leg ?Wound Treatment ?Wound #1 - Lower Leg Wound Laterality: Right, Anterior ?Cleanser: Soap and Water 1 x Per Week ?Discharge Instructions: May shower and wash wound with dial antibacterial soap and water prior to dressing  change. ?Cleanser: Wound Cleanser 1 x Per Week ?Discharge Instructions: Cleanse the wound with wound cleanser prior to applying a clean dressing using gauze sponges, not tissue or cotton balls. ?Peri-Wound Care: Triamcinolone 15

## 2022-01-26 NOTE — Progress Notes (Signed)
ELMO, RIO ([id number]) ?Visit Report for 01/25/2022 ?Arrival Information Details ?Patient Name: Date of Service: ?[name], [name] 01/25/2022 10:30 A M ?Medical Record Number: [id number] ?Patient Account Number: [id number] ?Date of Birth/Sex: Treating RN: ?[birth year] (88 y.o. [name]) Dellie Catholic ?Primary Care [name]: [name] Other Clinician: ?Referring [name]: ?Treating [name]/Extender: [name] ?[name] ?Weeks in Treatment: 7 ?Visit Information History Since Last Visit ?Added or deleted any medications: No ?Patient Arrived: Ambulatory ?Any new allergies or adverse reactions: No ?Arrival Time: 10:53 ?Had a fall or experienced change in No ?Transfer Assistance: None ?activities of daily living that may affect ?Patient Identification Verified: Yes ?risk of falls: ?Patient Requires Transmission-Based Precautions: No ?Signs or symptoms of abuse/neglect since last visito No ?Patient Has Alerts: Yes ?Hospitalized since last visit: No ?Patient Alerts: Patient on Blood Thinner ?Implantable device outside of the clinic excluding No ?cellular tissue based products placed in the center ?since last visit: ?Has Dressing in Place as Prescribed: Yes ?Has Compression in Place as Prescribed: Yes ?Pain Present Now: No ?Electronic Signature(s) ?Signed: 01/25/2022 5:46:17 PM By: Dellie Catholic RN ?Entered By: Dellie Catholic on 01/25/2022 10:59:20 ?-------------------------------------------------------------------------------- ?Compression Therapy Details ?Patient Name: Date of Service: ?[name], [name] 01/25/2022 10:30 A M ?Medical Record Number: [id number] ?Patient Account Number: [id number] ?Date of Birth/Sex: Treating RN: ?30-Sep-1932 (88 y.o. [name]) Dellie Catholic ?Primary Care [name]: [name] Other Clinician: ?Referring [name]: ?Treating [name]/Extender: [name] ?[name] ?Weeks in Treatment: 7 ?Compression Therapy Performed for Wound Assessment: Wound #1  Right,Anterior Lower Leg ?Performed By: Clinician Dellie Catholic, RN ?Compression Type: Three Layer ?Post Procedure Diagnosis ?Same as Pre-procedure ?Electronic Signature(s) ?Signed: 01/25/2022 5:46:17 PM By: Dellie Catholic RN ?Entered By: Dellie Catholic on 01/25/2022 17:44:31 ?-------------------------------------------------------------------------------- ?Encounter Discharge Information Details ?Patient Name: ?Date of Service: ?[name], [name] 01/25/2022 10:30 A M ?Medical Record Number: [id number] ?Patient Account Number: [id number] ?Date of Birth/Sex: ?Treating RN: ?[birth year] (88 y.o. [name]) Dellie Catholic ?Primary Care [name]: [name] ?Other Clinician: ?Referring [name]: ?Treating Kista Robb/Extender: [name] ?[name] ?Weeks in Treatment: 7 ?Encounter Discharge Information Items Post Procedure Vitals ?Discharge Condition: Stable ?Temperature (F): 97.8 ?Ambulatory Status: Ambulatory ?Pulse (bpm): 64 ?Discharge Destination: Home ?Respiratory Rate (breaths/min): 18 ?Transportation: Private Auto ?Blood Pressure (mmHg): 130/69 ?Accompanied By: friend ?Schedule Follow-up Appointment: Yes ?Clinical Summary of Care: Patient Declined ?Electronic Signature(s) ?Signed: 01/25/2022 5:46:17 PM By: Dellie Catholic RN ?Entered By: Dellie Catholic on 01/25/2022 17:43:15 ?-------------------------------------------------------------------------------- ?Lower Extremity Assessment Details ?Patient Name: ?Date of Service: ?[name], [name] 01/25/2022 10:30 A M ?Medical Record Number: [id number] ?Patient Account Number: [id number] ?Date of Birth/Sex: ?Treating RN: ?19-Sep-1932 (88 y.o. [name]) Dellie Catholic ?Primary Care Shainna Faux: [name] ?Other Clinician: ?Referring [name]: ?Treating [name]/Extender: [name] ?[name] ?Weeks in Treatment: 7 ?Edema Assessment ?Assessed: [Left: No] [Right: No] ?E[Left: dema] [Right: :] ?Calf ?Left: Right: ?Point of Measurement: 33 cm From  Medial Instep 27 cm ?Ankle ?Left: Right: ?Point of Measurement: 11 cm From Medial Instep 20.5 cm ?Electronic Signature(s) ?Signed: 01/25/2022 5:46:17 PM By: Dellie Catholic RN ?Entered By: Dellie Catholic on 01/25/2022 11:11:44 ?-------------------------------------------------------------------------------- ?Multi Wound Chart Details ?Patient Name: ?Date of Service: ?[name], [name] 01/25/2022 10:30 A M ?Medical Record Number: [id number] ?Patient Account Number: [id number] ?Date of Birth/Sex: ?Treating RN: ?Jul 27, 1932 (88 y.o. M) ?Primary Care [name]: [name] ?Other Clinician: ?Referring [name]: ?Treating [name]/Extender: [name] ?[name] ?Weeks in Treatment: 7 ?Vital Signs ?Height(in): 68 ?Pulse(bpm): 64 ?Weight(lbs): 161 ?Blood Pressure(mmHg): 130/69 ?Body Mass Index(BMI): 24.5 ?Temperature(??F):  97.8 ?Respiratory Rate(breaths/min): 18 ?Photos: [N/A:N/A] ?Right, Anterior Lower Leg N/A N/A ?Wound Location: ?Trauma N/A N/A ?Wounding Event: ?Venous Leg Ulcer N/A N/A ?Primary Etiology: ?Anemia, Arrhythmia, Congestive Heart N/A N/A ?Comorbid History: ?Failure, Coronary Artery Disease, ?Hypertension, Myocardial Infarction, ?Type II Diabetes, Gout, Neuropathy ?11/18/2021 N/A N/A ?Date Acquired: ?7 N/A N/A ?Weeks of Treatment: ?Open N/A N/A ?Wound Status: ?No N/A N/A ?Wound Recurrence: ?2.5x0.3x0.1 N/A N/A ?Measurements L x W x D (cm) ?0.589 N/A N/A ?A (cm?) : ?rea ?0.059 N/A N/A ?Volume (cm?) : ?96.80% N/A N/A ?% Reduction in A rea: ?96.80% N/A N/A ?% Reduction in Volume: ?Full Thickness Without Exposed N/A N/A ?Classification: ?Support Structures ?Medium N/A N/A ?Exudate A mount: ?Serosanguineous N/A N/A ?Exudate Type: ?red, brown N/A N/A ?Exudate Color: ?Flat and Intact N/A N/A ?Wound Margin: ?Large (67-100%) N/A N/A ?Granulation A mount: ?Red N/A N/A ?Granulation Quality: ?Small (1-33%) N/A N/A ?Necrotic A mount: ?Fat Layer (Subcutaneous Tissue): Yes N/A N/A ?Exposed  Structures: ?Fascia: No ?Tendon: No ?Muscle: No ?Joint: No ?Bone: No ?Small (1-33%) N/A N/A ?Epithelialization: ?Debridement - Excisional N/A N/A ?Debridement: ?Pre-procedure Verification/Time Out 11:28 N/A N/A ?Taken: ?Other N/A N/A ?Pain Control: ?Subcutaneous, Slough N/A N/A ?Tissue Debrided: ?Skin/Subcutaneous Tissue N/A N/A ?Level: ?0.75 N/A N/A ?Debridement A (sq cm): ?rea ?Curette N/A N/A ?Instrument: ?Minimum N/A N/A ?Bleeding: ?Pressure N/A N/A ?Hemostasis A chieved: ?0 N/A N/A ?Procedural Pain: ?0 N/A N/A ?Post Procedural Pain: ?Procedure was tolerated well N/A N/A ?Debridement Treatment Response: ?2.5x0.3x0.1 N/A N/A ?Post Debridement Measurements L x ?W x D (cm) ?0.059 N/A N/A ?Post Debridement Volume: (cm?) ?Debridement N/A N/A ?Procedures Performed: ?Treatment Notes ?Electronic Signature(s) ?Signed: 01/25/2022 5:30:14 PM By: Jeremiah Maudlin MD FACS ?Signed: 01/25/2022 5:30:14 PM By: Jeremiah Maudlin MD FACS ?Entered By: [name] on 01/25/2022 17:30:14 ?-------------------------------------------------------------------------------- ?Multi-Disciplinary Care Plan Details ?Patient Name: ?Date of Service: ?[name], [name] 01/25/2022 10:30 A M ?Medical Record Number: [id number] ?Patient Account Number: [id number] ?Date of Birth/Sex: ?Treating RN: ?09-11-1932 (88 y.o. [name] ?Primary Care Georganne Siple: [name] ?Other Clinician: ?Referring Arriona Prest: ?Treating Jamareon Shimel/Extender: [name] ?[name] ?Weeks in Treatment: 7 ?Multidisciplinary Care Plan reviewed with physician ?Active Inactive ?Abuse / Safety / Falls / Self Care Management ?Nursing Diagnoses: ?History of Falls ?Potential for injury related to falls ?Goals: ?Patient will not experience any injury related to falls ?Date Initiated: 12/07/2021 ?Target Resolution Date: 02/01/2022 ?Goal Status: Active ?Patient/caregiver will verbalize/demonstrate measures taken to prevent injury and/or falls ?Date Initiated:  12/07/2021 ?Target Resolution Date: 02/01/2022 ?Goal Status: Active ?Interventions: ?Assess Activities of Daily Living upon admission and as needed ?Assess fall risk on admission and as needed ?Assess: immobility, friction, shearing

## 2022-02-01 ENCOUNTER — Other Ambulatory Visit: Payer: Self-pay

## 2022-02-01 ENCOUNTER — Encounter (HOSPITAL_BASED_OUTPATIENT_CLINIC_OR_DEPARTMENT_OTHER): Payer: Medicare Other | Admitting: General Surgery

## 2022-02-01 DIAGNOSIS — I130 Hypertensive heart and chronic kidney disease with heart failure and stage 1 through stage 4 chronic kidney disease, or unspecified chronic kidney disease: Secondary | ICD-10-CM | POA: Diagnosis not present

## 2022-02-01 DIAGNOSIS — E1122 Type 2 diabetes mellitus with diabetic chronic kidney disease: Secondary | ICD-10-CM | POA: Diagnosis not present

## 2022-02-01 DIAGNOSIS — E1142 Type 2 diabetes mellitus with diabetic polyneuropathy: Secondary | ICD-10-CM | POA: Diagnosis not present

## 2022-02-01 DIAGNOSIS — N189 Chronic kidney disease, unspecified: Secondary | ICD-10-CM | POA: Diagnosis not present

## 2022-02-01 DIAGNOSIS — L97812 Non-pressure chronic ulcer of other part of right lower leg with fat layer exposed: Secondary | ICD-10-CM | POA: Diagnosis not present

## 2022-02-01 DIAGNOSIS — I87331 Chronic venous hypertension (idiopathic) with ulcer and inflammation of right lower extremity: Secondary | ICD-10-CM | POA: Diagnosis not present

## 2022-02-01 DIAGNOSIS — L97818 Non-pressure chronic ulcer of other part of right lower leg with other specified severity: Secondary | ICD-10-CM | POA: Diagnosis not present

## 2022-02-02 NOTE — Progress Notes (Signed)
NICHALOS, BRENTON ([id number]) ?Visit Report for 02/01/2022 ?Arrival Information Details ?Patient Name: Date of Service: ?ORLEY, LAWRY. 02/01/2022 1:15 PM ?Medical Record Number: [id number] ?Patient Account Number: [id number] ?Date of Birth/Sex: Treating RN: ?02-Jan-1932 (88 y.o. Ernestene Mention ?Primary Care Anoushka Divito: [name] Other Clinician: ?Referring Alisah Grandberry: ?Treating Tanush Drees/Extender: Fredirick Maudlin ?[name] ?Weeks in Treatment: 8 ?Visit Information History Since Last Visit ?Added or deleted any medications: No ?Patient Arrived: Kasandra Knudsen ?Any new allergies or adverse reactions: No ?Arrival Time: 13:21 ?Had a fall or experienced change in No ?Accompanied By: daughter ?activities of daily living that may affect ?Transfer Assistance: None ?risk of falls: ?Patient Identification Verified: Yes ?Signs or symptoms of abuse/neglect since last visito No ?Secondary Verification Process Completed: Yes ?Hospitalized since last visit: No ?Patient Requires Transmission-Based Precautions: No ?Implantable device outside of the clinic excluding No ?Patient Has Alerts: Yes ?cellular tissue based products placed in the center ?Patient Alerts: Patient on Blood Thinner since last visit: ?Has Dressing in Place as Prescribed: Yes ?Pain Present Now: No ?Electronic Signature(s) ?Signed: 02/01/2022 3:06:47 PM By: Sandre Kitty ?Entered By: Sandre Kitty on 02/01/2022 13:21:25 ?-------------------------------------------------------------------------------- ?Clinic Level of Care Assessment Details ?Patient Name: Date of Service: ?JASH, WAHLEN. 02/01/2022 1:15 PM ?Medical Record Number: [id number] ?Patient Account Number: [id number] ?Date of Birth/Sex: Treating RN: ?Sep 21, 1932 (88 y.o. Ernestene Mention ?Primary Care Annarose Ouellet: [name] Other Clinician: ?Referring Kismet Facemire: ?Treating Henritta Mutz/Extender: Fredirick Maudlin ?[name] ?Weeks in Treatment: 8 ?Clinic Level of Care Assessment  Items ?TOOL 4 Quantity Score ?'[]'$  - 0 ?Use when only an EandM is performed on FOLLOW-UP visit ?ASSESSMENTS - Nursing Assessment / Reassessment ?X- 1 10 ?Reassessment of Co-morbidities (includes updates in patient status) ?X- 1 5 ?Reassessment of Adherence to Treatment Plan ?ASSESSMENTS - Wound and Skin A ssessment / Reassessment ?X - Simple Wound Assessment / Reassessment - one wound 1 5 ?'[]'$  - 0 ?Complex Wound Assessment / Reassessment - multiple wounds ?'[]'$  - 0 ?Dermatologic / Skin Assessment (not related to wound area) ?ASSESSMENTS - Focused Assessment ?X- 1 5 ?Circumferential Edema Measurements - multi extremities ?'[]'$  - 0 ?Nutritional Assessment / Counseling / Intervention ?X- 1 5 ?Lower Extremity Assessment (monofilament, tuning fork, pulses) ?'[]'$  - 0 ?Peripheral Arterial Disease Assessment (using hand held doppler) ?ASSESSMENTS - Ostomy and/or Continence Assessment and Care ?'[]'$  - 0 ?Incontinence Assessment and Management ?'[]'$  - 0 ?Ostomy Care Assessment and Management (repouching, etc.) ?PROCESS - Coordination of Care ?X - Simple Patient / Family Education for ongoing care 1 15 ?'[]'$  - 0 ?Complex (extensive) Patient / Family Education for ongoing care ?X- 1 10 ?Staff obtains Consents, Records, T Results / Process Orders ?est ?'[]'$  - 0 ?Staff telephones HHA, Nursing Homes / Clarify orders / etc ?'[]'$  - 0 ?Routine Transfer to another Facility (non-emergent condition) ?'[]'$  - 0 ?Routine Hospital Admission (non-emergent condition) ?'[]'$  - 0 ?New Admissions / Biomedical engineer / Ordering NPWT Apligraf, etc. ?, ?'[]'$  - 0 ?Emergency Hospital Admission (emergent condition) ?X- 1 10 ?Simple Discharge Coordination ?'[]'$  - 0 ?Complex (extensive) Discharge Coordination ?PROCESS - Special Needs ?'[]'$  - 0 ?Pediatric / Minor Patient Management ?'[]'$  - 0 ?Isolation Patient Management ?'[]'$  - 0 ?Hearing / Language / Visual special needs ?'[]'$  - 0 ?Assessment of Community assistance (transportation, D/C planning, etc.) ?'[]'$  - 0 ?Additional  assistance / Altered mentation ?'[]'$  - 0 ?Support Surface(s) Assessment (bed, cushion, seat, etc.) ?INTERVENTIONS - Wound Cleansing / Measurement ?X - Simple Wound Cleansing - one wound 1 5 ?'[]'$  - 0 ?  Complex Wound Cleansing - multiple wounds ?X- 1 5 ?Wound Imaging (photographs - any number of wounds) ?'[]'$  - 0 ?Wound Tracing (instead of photographs) ?'[]'$  - 0 ?Simple Wound Measurement - one wound ?'[]'$  - 0 ?Complex Wound Measurement - multiple wounds ?INTERVENTIONS - Wound Dressings ?'[]'$  - 0 ?Small Wound Dressing one or multiple wounds ?'[]'$  - 0 ?Medium Wound Dressing one or multiple wounds ?'[]'$  - 0 ?Large Wound Dressing one or multiple wounds ?'[]'$  - 0 ?Application of Medications - topical ?'[]'$  - 0 ?Application of Medications - injection ?INTERVENTIONS - Miscellaneous ?'[]'$  - 0 ?External ear exam ?'[]'$  - 0 ?Specimen Collection (cultures, biopsies, blood, body fluids, etc.) ?'[]'$  - 0 ?Specimen(s) / Culture(s) sent or taken to Lab for analysis ?'[]'$  - 0 ?Patient Transfer (multiple staff / Civil Service fast streamer / Similar devices) ?'[]'$  - 0 ?Simple Staple / Suture removal (25 or less) ?'[]'$  - 0 ?Complex Staple / Suture removal (26 or more) ?'[]'$  - 0 ?Hypo / Hyperglycemic Management (close monitor of Blood Glucose) ?'[]'$  - 0 ?Ankle / Brachial Index (ABI) - do not check if billed separately ?X- 1 5 ?Vital Signs ?Has the patient been seen at the hospital within the last three years: Yes ?Total Score: 80 ?Level Of Care: New/Established - Level 3 ?Electronic Signature(s) ?Signed: 02/02/2022 5:05:47 PM By: Baruch Gouty RN, [name] ?Entered By: Baruch Gouty on 02/01/2022 13:46:08 ?-------------------------------------------------------------------------------- ?Encounter Discharge Information Details ?Patient Name: Date of Service: ?TREVIONE, WERT. 02/01/2022 1:15 PM ?Medical Record Number: [id number] ?Patient Account Number: [id number] ?Date of Birth/Sex: Treating RN: ?[birth year] (88 y.o. Ernestene Mention ?Primary Care Tavarious Freel: [name] Other  Clinician: ?Referring Birdia Jaycox: ?Treating Lowry Bala/Extender: Fredirick Maudlin ?[name] ?Weeks in Treatment: 8 ?Encounter Discharge Information Items ?Discharge Condition: Stable ?Ambulatory Status: Kasandra Knudsen ?Discharge Destination: Home ?Transportation: Private Auto ?Accompanied By: daughter ?Schedule Follow-up Appointment: Yes ?Clinical Summary of Care: Patient Declined ?Electronic Signature(s) ?Signed: 02/02/2022 5:05:47 PM By: Baruch Gouty RN, [name] ?Entered By: Baruch Gouty on 02/01/2022 13:50:22 ?-------------------------------------------------------------------------------- ?Lower Extremity Assessment Details ?Patient Name: Date of Service: ?VELMER, WOELFEL. 02/01/2022 1:15 PM ?Medical Record Number: [id number] ?Patient Account Number: [id number] ?Date of Birth/Sex: Treating RN: ?1932/07/28 (88 y.o. Ernestene Mention ?Primary Care Tuvia Woodrick: [name] Other Clinician: ?Referring Stavros Cail: ?Treating Yarelis Ambrosino/Extender: Fredirick Maudlin ?[name] ?Weeks in Treatment: 8 ?Edema Assessment ?Assessed: [Left: No] [Right: No] ?[Left: Edema] [Right: :] ?Calf ?Left: Right: ?Point of Measurement: 33 cm From Medial Instep 29 cm ?Ankle ?Left: Right: ?Point of Measurement: 11 cm From Medial Instep 21 cm ?Vascular Assessment ?Pulses: ?Dorsalis Pedis ?Palpable: [Right:Yes] ?Electronic Signature(s) ?Signed: 02/02/2022 5:05:47 PM By: Baruch Gouty RN, [name] ?Entered By: Baruch Gouty on 02/01/2022 13:35:02 ?-------------------------------------------------------------------------------- ?Multi Wound Chart Details ?Patient Name: ?Date of Service: ?KURT, HOFFMEIER. 02/01/2022 1:15 PM ?Medical Record Number: [id number] ?Patient Account Number: [id number] ?Date of Birth/Sex: ?Treating RN: ?28-May-1932 (88 y.o. Ernestene Mention ?Primary Care Preslea Rhodus: [name] ?Other Clinician: ?Referring Reeva Davern: ?Treating Maddelyn Rocca/Extender: Fredirick Maudlin ?[name] ?Weeks in Treatment: 8 ?Vital  Signs ?Height(in): 68 ?Pulse(bpm): 79 ?Weight(lbs): 161 ?Blood Pressure(mmHg): 129/67 ?Body Mass Index(BMI): 24.5 ?Temperature(??F): 98.3 ?Respiratory Rate(breaths/min): 18 ?Photos: [N/A:N/A] ?Right, Anterior Lower Leg N/A N/A ?W

## 2022-02-02 NOTE — Progress Notes (Signed)
HAZE, ANTILLON ([id number]) ?Visit Report for 02/01/2022 ?Chief Complaint Document Details ?Patient Name: Date of Service: ?[name], [name]. 02/01/2022 1:15 PM ?Medical Record Number: [id number] ?Patient Account Number: [id number] ?Date of Birth/Sex: Treating RN: ?[birth year] (88 y.o. Ernestene Mention ?Primary Care Provider: [name] Other Clinician: ?Referring Provider: ?Treating Provider/Extender: Fredirick Maudlin ?[name] ?Weeks in Treatment: 8 ?Information Obtained from: Patient ?Chief Complaint ?12/07/2021; patient comes in for review of a wound on his right anterior lower leg which was initially traumatic ?Electronic Signature(s) ?Signed: 02/01/2022 1:54:16 PM By: Fredirick Maudlin MD FACS ?Entered By: Fredirick Maudlin on 02/01/2022 13:54:16 ?-------------------------------------------------------------------------------- ?HPI Details ?Patient Name: Date of Service: ?[name], [name]. 02/01/2022 1:15 PM ?Medical Record Number: [id number] ?Patient Account Number: [id number] ?Date of Birth/Sex: Treating RN: ?1932/10/12 (88 y.o. Ernestene Mention ?Primary Care Provider: [name] Other Clinician: ?Referring Provider: ?Treating Provider/Extender: Fredirick Maudlin ?[name] ?Weeks in Treatment: 8 ?History of Present Illness ?HPI Description: ADMISSION ?12/07/2021 ?This is a pleasant 88 year old man who arrives in clinic accompanied by his daughter for review of a traumatic wound on the right leg that happened on ?11/18/2021. This was a result of a fall he had at home. He was seen in the ER on the same day and had 7 sutures placed. Ultimately the sutures were removed ?by the family physician on 1/16 however the wound dehisced. They stated that it was hard to get the original dressing off that he had not changed as well. It ?was felt to be infected. He was given Rocephin and subsequently doxycycline which he finished yesterday. A culture at that time showed methicillin sensitive ?staph  aureus. ?He comes in the clinic today with eschar over the entire wound surface. There is erythema around the wound but he says he had some of that even before ?this happened. He quite clearly has chronic venous insufficiency bilaterally. ?Past medical history includes basal cell CA of the skin, hypertension, chronic kidney disease, heart failure with preserved ejection fraction, coronary artery ?disease, carotid stenosis, atrial fib on Eliquis, history of vein stripping in the right leg, type 2 diabetes on insulin and mitral valve regurgitation ?ABI in our clinic was 0.92 on the right ?2/21; continued nice improvements in this wound. Healthy granulation using Hydrofera Blue under 3 lack compression. ?1/31; wound looks better this week smaller still requiring debridement. We will use silver alginate under compression ?2/7; wound looks better no debridement is required I changed him to [facility] today still under compression ?2/14; continued nice improvement Hydrofera Blue under compression ?2/28; continued improvement in wound measurements using Hydrofera Blue and compression. This wound looks like it's progressing towards closure initially a ?large traumatic wound in the setting of chronic venous insufficiency ?01/18/2022: Continued improvement with Hydrofera Blue and compression. Minimal debridement required. ?01/25/2022: Continued decrease in wound dimensions. It is very nearly closed. He has been in [facility] with 3 layer compression. ?02/01/2022: Wound is closed. ?Electronic Signature(s) ?Signed: 02/01/2022 1:54:37 PM By: Fredirick Maudlin MD FACS ?Entered By: Fredirick Maudlin on 02/01/2022 13:54:36 ?-------------------------------------------------------------------------------- ?Physical Exam Details ?Patient Name: Date of Service: ?[name], [name]. 02/01/2022 1:15 PM ?Medical Record Number: [id number] ?Patient Account Number: [id number] ?Date of Birth/Sex: Treating RN: ?October 06, 1932 (88 y.o. Ernestene Mention ?Primary Care Provider: [name] Other Clinician: ?Referring Provider: ?Treating Provider/Extender: Fredirick Maudlin ?[name] ?Weeks in Treatment: 8 ?Constitutional ?. . . . No acute distress. ?Respiratory ?Normal work of breathing on room air. ?Notes ?02/01/2022: Wound examthe wound is  closed. ?Electronic Signature(s) ?Signed: 02/01/2022 1:55:22 PM By: Fredirick Maudlin MD FACS ?Entered By: Fredirick Maudlin on 02/01/2022 13:55:22 ?-------------------------------------------------------------------------------- ?Physician Orders Details ?Patient Name: Date of Service: ?[name], [name]. 02/01/2022 1:15 PM ?Medical Record Number: [id number] ?Patient Account Number: [id number] ?Date of Birth/Sex: Treating RN: ?1931-12-11 (88 y.o. Ernestene Mention ?Primary Care Provider: [name] Other Clinician: ?Referring Provider: ?Treating Provider/Extender: Fredirick Maudlin ?[name] ?Weeks in Treatment: 8 ?Verbal / Phone Orders: No ?Diagnosis Coding ?ICD-10 Coding ?Code Description ?I37.048G Laceration without foreign body, right lower leg, subsequent encounter ?L97.818 Non-pressure chronic ulcer of other part of right lower leg with other specified severity ?Q91.694 Chronic venous hypertension (idiopathic) with inflammation of right lower extremity ?E11.42 Type 2 diabetes mellitus with diabetic polyneuropathy ?Discharge From [facility] ?Discharge from [place] ?Bathing/ Shower/ Hygiene ?May shower and wash wound with soap and water. ?Edema Control - Lymphedema / SCD / Other ?Elevate legs to the level of the heart or above for 30 minutes daily and/or when sitting, a frequency of: - throughout the day ?Avoid standing for long periods of time. ?Exercise regularly ?Moisturize legs daily. - both legs daily ?Compression stocking or Garment 20-30 mm/Hg pressure to: - both legs daily ?Electronic Signature(s) ?Signed: 02/01/2022 1:55:57 PM By: Fredirick Maudlin MD FACS ?Entered By:  Fredirick Maudlin on 02/01/2022 13:55:56 ?-------------------------------------------------------------------------------- ?Problem List Details ?Patient Name: ?Date of Service: ?[name], [name]. 02/01/2022 1:15 PM ?Medical Record Number: [id number] ?Patient Account Number: [id number] ?Date of Birth/Sex: ?Treating RN: ?11-24-1931 (88 y.o. Ernestene Mention ?Primary Care Provider: [name] ?Other Clinician: ?Referring Provider: ?Treating Provider/Extender: Fredirick Maudlin ?[name] ?Weeks in Treatment: 8 ?Active Problems ?ICD-10 ?Encounter ?Code Description Active Date MDM ?Diagnosis ?K34.917H Laceration without foreign body, right lower leg, subsequent encounter 12/07/2021 No Yes ?L97.818 Non-pressure chronic ulcer of other part of right lower leg with other specified 12/07/2021 No Yes ?severity ?X50.569 Chronic venous hypertension (idiopathic) with inflammation of right lower 12/07/2021 No Yes ?extremity ?E11.42 Type 2 diabetes mellitus with diabetic polyneuropathy 12/07/2021 No Yes ?Inactive Problems ?Resolved Problems ?Electronic Signature(s) ?Signed: 02/01/2022 1:53:02 PM By: Fredirick Maudlin MD FACS ?Entered By: Fredirick Maudlin on 02/01/2022 13:53:01 ?-------------------------------------------------------------------------------- ?Progress Note Details ?Patient Name: ?Date of Service: ?[name], [name]. 02/01/2022 1:15 PM ?Medical Record Number: [id number] ?Patient Account Number: [id number] ?Date of Birth/Sex: ?Treating RN: ?Jun 20, 1932 (88 y.o. Ernestene Mention ?Primary Care Provider: [name] ?Other Clinician: ?Referring Provider: ?Treating Provider/Extender: Fredirick Maudlin ?[name] ?Weeks in Treatment: 8 ?Subjective ?Chief Complaint ?Information obtained from Patient ?12/07/2021; patient comes in for review of a wound on his right anterior lower leg which was initially traumatic ?History of Present Illness (HPI) ?ADMISSION ?12/07/2021 ?This is a pleasant 88 year old man  who arrives in clinic accompanied by his daughter for review of a traumatic wound on the right leg that happened on ?11/18/2021. This was a result of a fall he had at home. He was seen in the ER on the same day

## 2022-02-03 ENCOUNTER — Other Ambulatory Visit (HOSPITAL_COMMUNITY): Payer: Self-pay | Admitting: Cardiovascular Disease

## 2022-02-03 DIAGNOSIS — Z9889 Other specified postprocedural states: Secondary | ICD-10-CM

## 2022-02-04 DIAGNOSIS — Z1283 Encounter for screening for malignant neoplasm of skin: Secondary | ICD-10-CM | POA: Diagnosis not present

## 2022-02-04 DIAGNOSIS — Z08 Encounter for follow-up examination after completed treatment for malignant neoplasm: Secondary | ICD-10-CM | POA: Diagnosis not present

## 2022-02-04 DIAGNOSIS — Z85820 Personal history of malignant melanoma of skin: Secondary | ICD-10-CM | POA: Diagnosis not present

## 2022-02-04 DIAGNOSIS — X32XXXD Exposure to sunlight, subsequent encounter: Secondary | ICD-10-CM | POA: Diagnosis not present

## 2022-02-04 DIAGNOSIS — L570 Actinic keratosis: Secondary | ICD-10-CM | POA: Diagnosis not present

## 2022-02-07 ENCOUNTER — Other Ambulatory Visit: Payer: Self-pay | Admitting: Cardiovascular Disease

## 2022-02-08 NOTE — Chronic Care Management (AMB) (Signed)
Ongoing outreach regarding available care management services will occur over the next 30 days ?

## 2022-03-14 ENCOUNTER — Telehealth: Payer: Self-pay | Admitting: *Deleted

## 2022-03-14 ENCOUNTER — Other Ambulatory Visit: Payer: Self-pay | Admitting: Cardiovascular Disease

## 2022-03-14 NOTE — Chronic Care Management (AMB) (Signed)
?  Care Management  ? ?Note ? ?03/14/2022 ?Name: [name] MRN: [id number] DOB: 1932-07-18 ? ?[name] is a 88 y.o. year old male who is a primary care patient of Tonia Ghent, MD. I reached out to ADRIAN DINOVO by phone today offer care coordination services.  ? ?[name] was given information about care management services today including:  ?Care management services include personalized support from designated clinical staff supervised by his physician, including individualized plan of care and coordination with other care providers ?24/7 contact phone numbers for assistance for urgent and routine care needs. ?The patient may stop care management services at any time by phone call to the office staff. ? ?Patient agreed to services and verbal consent obtained.  ? ?Follow up plan: ?Telephone appointment with care management team member scheduled for: 03/15/2022 ? ?Tupac Jeffus, CCMA ?Care Guide, Embedded Care Coordination ?[facility]  ?Direct Dial: [phone] ? ? ?

## 2022-03-15 ENCOUNTER — Ambulatory Visit: Payer: Medicare Other

## 2022-03-15 DIAGNOSIS — E1142 Type 2 diabetes mellitus with diabetic polyneuropathy: Secondary | ICD-10-CM

## 2022-03-15 NOTE — Chronic Care Management (AMB) (Signed)
? Care Management ?  ? RN Visit Note ? ?03/15/2022 ?Name: [name] MRN: [id number] DOB: [birth year] ? ?Subjective: ?[name] is a 88 y.o. year old male who is a primary care patient of Tonia Ghent, MD. The care management team was consulted for assistance with disease management and care coordination needs.   ? ?Engaged with patient by telephone for initial visit in response to provider referral for case management and/or care coordination services.  ? ?Consent to Services:  ? [name] was given information about Care Management services today including:  ?Care Management services includes personalized support from designated clinical staff supervised by his physician, including individualized plan of care and coordination with other care providers ?24/7 contact phone numbers for assistance for urgent and routine care needs. ?The patient may stop case management services at any time by phone call to the office staff. ? ?Patient agreed to services and consent obtained.  ? ?Assessment: Review of patient past medical history, allergies, medications, health status, including review of consultants reports, laboratory and other test data, was performed as part of comprehensive evaluation and provision of chronic care management services.  ? ?SDOH (Social Determinants of Health) assessments and interventions performed:  ?SDOH Interventions   ? ?Flowsheet Row Most Recent Value  ?SDOH Interventions   ?Food Insecurity Interventions Intervention Not Indicated  ?Housing Interventions Intervention Not Indicated  ?Transportation Interventions Intervention Not Indicated  ? ?  ?  ? ?Care Plan ? ?No Known Allergies ? ?Outpatient Encounter Medications as of 03/15/2022  ?Medication Sig  ? allopurinol (ZYLOPRIM) 100 MG tablet TAKE ONE-HALF (1/2) TABLET EVERY MONDAY, WEDNESDAY AND FRIDAY  ? amoxicillin (AMOXIL) 500 MG tablet Take by mouth. Patient takes 4 tablets prior to going to the dentist  ? aspirin 81 MG tablet Take 81  mg by mouth daily.  ? cetirizine (ZYRTEC) 10 MG tablet Take 10 mg by mouth daily as needed for allergies.  ? Cholecalciferol (VITAMIN D) 1000 UNITS capsule Take 1,000 Units by mouth daily.   ? diclofenac Sodium (VOLTAREN) 1 % GEL Apply 2 g topically 4 (four) times daily.  ? ELIQUIS 2.5 MG TABS tablet TAKE 1 TABLET TWICE A DAY  ? fish oil-omega-3 fatty acids 1000 MG capsule Take 1 g by mouth daily.  ? furosemide (LASIX) 80 MG tablet TAKE 1 TABLET TWICE A DAY (DOSAGE CHANGE)  ? hydrALAZINE (APRESOLINE) 25 MG tablet TAKE 1 TABLET IN THE MORNING AND AT BEDTIME  ? ipratropium (ATROVENT) 0.03 % nasal spray USE 2 SPRAYS IN EACH NOSTRIL TWICE A DAY AS NEEDED FOR RHINITIS  ? JARDIANCE 10 MG TABS tablet TAKE 1 TABLET DAILY BEFORE BREAKFAST  ? LANTUS 100 UNIT/ML injection INJECT 20 TO 25 UNITS AT BEDTIME  ? magnesium oxide (MAG-OX) 400 MG tablet Take 400 mg by mouth daily.  ? metoprolol tartrate (LOPRESSOR) 50 MG tablet TAKE 1 TABLET TWICE A DAY  ? Multiple Vitamin (MULTIVITAMIN WITH MINERALS) TABS Take 1 tablet by mouth daily.  ? nitroGLYCERIN (NITROSTAT) 0.4 MG SL tablet Place 1 tablet (0.4 mg total) under the tongue every 5 (five) minutes as needed for chest pain. For a maximum of 3 doses.  ? Potassium Gluconate 595 MG CAPS Take 595 mg by mouth daily.  ? ranolazine (RANEXA) 500 MG 12 hr tablet TAKE 1 TABLET TWICE A DAY  ? rosuvastatin (CRESTOR) 40 MG tablet TAKE 1 TABLET AT BEDTIME  ? traMADol (ULTRAM) 50 MG tablet Take 1 tablet (50 mg total) by mouth every 6 (  six) hours as needed.  ? BD INSULIN SYRINGE ULTRAFINE 31G X 5/16" 0.3 ML MISC USE DAILY AS INSTRUCTED  ? glucose blood (FREESTYLE LITE) test strip USE TO TEST BLOOD SUGAR ONCE DAILY AND AS NEEDED FOR DIABETES MELLITUS  ? ?No facility-administered encounter medications on file as of 03/15/2022.  ? ? ?Patient Active Problem List  ? Diagnosis Date Noted  ? Staphylococcus infection 12/02/2021  ? Wound infection 11/29/2021  ? Encounter for removal of sutures 11/29/2021  ?  Laceration of right lower extremity 11/25/2021  ? Rhinitis 11/01/2020  ? Severe mitral insufficiency 02/06/2020  ? S/P mitral valve repair 02/06/2020  ? PAF (paroxysmal atrial fibrillation) ([place])   ? CAD (coronary artery disease) 08/02/2018  ? Macular degeneration 06/24/2018  ? Abdominal wall hernia 05/24/2017  ? History of lower GI bleeding   ? Pulmonary hypertension ([place])   ? Acute on chronic diastolic (congestive) heart failure ([place]) 11/20/2015  ? Gout 09/09/2014  ? Medicare annual wellness visit, initial 09/09/2014  ? RBBB (right bundle branch block)   ? Carotid artery disease ([place])   ? Hx of CABG   ? Non-rheumatic mitral regurgitation   ? CRI (chronic renal insufficiency), stage 4 (severe) ([place]) 03/24/2011  ? Peripheral sensory neuropathy due to type 2 diabetes mellitus ([place])   ? GERD   ? Sleep apnea   ? Insulin dependent type 2 diabetes mellitus ([place])   ? HLD (hyperlipidemia)   ? HTN (hypertension)   ? ? ?Conditions to be addressed/monitored: DMII ? ?Care Plan : RNCM plan of care  ?Updates made by Dannielle Karvonen, RN since 03/15/2022 12:00 AM  ?  ? ?Problem: Chronic disease management education and care coordination needs.   ?Priority: High  ?  ? ?Long-Range Goal: Development of plan of care to address chronic disease management and care coordination needs.   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  Patient reports last follow up with primary care office was 12/02/21.  He reports sustaining a fall in January 2023 and sustained leg wound/ infection.  He states sutures were required. Patient reports being treated at the wound clinic until 02/01/22 at which time he was released due to wound healing. Patient reports monitoring his blood sugars daily. He reports today's fasting blood sugar was 142.  Patient states his blood sugars range from 100 - 220  with average blood sugar of 180 fasting.  Patient states he does not drive due to having macular degeneration. He states he uses magnifying glass to read. Patient reports having strong family support from son and daughter.  ?RNCM Clinical Goal(s):  ?Patient will verbalize understanding of plan for management of DMII as evidenced by patient self report and/ or notation in chart ?take all medications exactly as prescribed and will call provider for medication related questions as evidenced by patient self report and/ or notation in chart    ?attend all scheduled medical appointments:   as evidenced by patient self report and/ or notation in chart        ?continue to work with Consulting civil engineer and/or Social Worker to address care management and care coordination needs related to DMII as evidenced by adherence to CM Team Scheduled appointments     through collaboration with Consulting civil engineer, provider, and care team.  ? ?Interventions: ?1:1 collaboration with  primary care provider regarding development and update of comprehensive plan of care as evidenced by provider attestation and co-signature ?Inter-disciplinary care team collaboration (see longitudinal plan of care) ?Evaluation of current treatment plan related to  self management and patient's adherence to plan as established by provider ? ? ?Diabetes Interventions:  (Status:  New goal.) Long Term Goal ?Assessed patient's understanding of A1c goal: <7% ?Reviewed medications with patient and discussed importance of medication adherence ?Discussed plans with patient for ongoing care management follow up and provided patient with direct contact information for care management team ?Provided patient with written educational materials related to hypo and hyperglycemia and importance of correct treatment ?Reviewed scheduled/upcoming provider appointments ?Lab Results  ?Component Value Date  ? HGBA1C 9.3 (H) 10/29/2020  ?  ? ?Patient Goals/Self-Care Activities: ?Take  medications as prescribed   ?Attend all scheduled provider appointments ?Call pharmacy for medication refills 3-7 days in advance of running out of medications ?Call provider office for new concerns or questi

## 2022-03-15 NOTE — Patient Instructions (Signed)
Visit Information ? ?Thank you for taking time to visit with me today. Please don't hesitate to contact me if I can be of assistance to you before our next scheduled telephone appointment. ? ?Following are the goals we discussed today:  ?Take medications as prescribed   ?Attend all scheduled provider appointments ?Call pharmacy for medication refills 3-7 days in advance of running out of medications ?Call provider office for new concerns or questions  ? ?Our next appointment is by telephone on 05/05/22 at 11:00 am ? ?Please call the care guide team at [phone] if you need to cancel or reschedule your appointment.  ? ?If you are experiencing a Mental Health or [place] or need someone to talk to, please call the Suicide and Crisis Lifeline: 988 ?call 1-800-273-TALK (toll free, 24 hour hotline)  ? ?Following is a copy of your full plan of care:  ?Care Plan : RNCM plan of care  ?Updates made by Jeremiah Karvonen, RN since 03/15/2022 12:00 AM  ?  ? ?Problem: Chronic disease management education and care coordination needs.   ?Priority: High  ?  ? ?Long-Range Goal: Development of plan of care to address chronic disease management and care coordination needs.   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  Patient reports last follow up with primary care office was 12/02/21.  He reports sustaining a fall in January 2023 and sustained leg wound/ infection.  He states sutures were required. Patient reports being treated at the wound clinic until 02/01/22 at which time he was released due to wound healing. Patient reports monitoring his blood sugars daily. He reports today's fasting blood sugar was 142.  Patient states his blood sugars range from 100 - 220 with average blood sugar of 180 fasting.  Patient states he does not drive due to having  macular degeneration. He states he uses magnifying glass to read. Patient reports having strong family support from son and daughter.  ?RNCM Clinical Goal(s):  ?Patient will verbalize understanding of plan for management of DMII as evidenced by patient self report and/ or notation in chart ?take all medications exactly as prescribed and will call provider for medication related questions as evidenced by patient self report and/ or notation in chart    ?attend all scheduled medical appointments:   as evidenced by patient self report and/ or notation in chart        ?continue to work with Consulting civil engineer and/or Social Worker to address care management and care coordination needs related to DMII as evidenced by adherence to CM Team Scheduled appointments     through collaboration with Consulting civil engineer, provider, and care team.  ? ?Interventions: ?1:1 collaboration with primary care provider regarding development and update of comprehensive plan of care as evidenced by provider attestation and co-signature ?Inter-disciplinary care team collaboration (see longitudinal plan of care) ?Evaluation of current treatment plan related to  self management and patient's adherence to plan as established by provider ? ? ?Diabetes Interventions:  (Status:  New goal.) Long Term Goal ?Assessed patient's understanding of A1c goal: <7% ?Reviewed medications with patient and discussed importance of medication adherence ?Discussed plans with patient for ongoing care management follow up and provided patient with direct contact information for care management team ?Provided patient with written educational materials related to hypo and  hyperglycemia and importance of correct treatment ?Reviewed scheduled/upcoming provider appointments ?Lab Results  ?Component Value Date  ? HGBA1C 9.3 (H) 10/29/2020  ?  ? ?Patient Goals/Self-Care Activities: ?Take medications as prescribed   ?Attend all scheduled provider appointments ?Call pharmacy for  medication refills 3-7 days in advance of running out of medications ?Call provider office for new concerns or questions  ?  ?  ? ? ?[name] was given information about Care Management services by the embedded care coordination team including:  ?Care Management services include personalized support from designated clinical staff supervised by his physician, including individualized plan of care and coordination with other care providers ?24/7 contact phone numbers for assistance for urgent and routine care needs. ?The patient may stop CCM services at any time (effective at the end of the month) by phone call to the office staff. ? ?Patient agreed to services and verbal consent obtained.  ? ?The patient verbalized understanding of instructions, educational materials, and care plan provided today and agreed to receive a mailed copy of patient instructions, educational materials, and care plan.  ? ?The patient has been provided with contact information for the care management team and has been advised to call with any health related questions or concerns.  ?The care management team will reach out to the patient again over the next 45 days.  ? ?Jeremiah Plowman RN,[name],CCM ?RN Case Manager ?[place]  ?[phone] ? ? ?  ?

## 2022-04-04 ENCOUNTER — Telehealth: Payer: Self-pay | Admitting: Cardiovascular Disease

## 2022-04-04 NOTE — Telephone Encounter (Signed)
*  STAT* If patient is at the pharmacy, call can be transferred to refill team.   1. Which medications need to be refilled? (please list name of each medication and dose if known)   metoprolol tartrate (LOPRESSOR) 50 MG tablet    2. Which pharmacy/location (including street and city if local pharmacy) is medication to be sent to? [facility] [place], [place]  3. Do they need a 30 day or 90 day supply?  90 day

## 2022-04-06 MED ORDER — METOPROLOL TARTRATE 50 MG PO TABS: 50.0000 mg | ORAL_TABLET | Freq: Two times a day (BID) | ORAL | 3 refills | Status: DC

## 2022-04-14 ENCOUNTER — Other Ambulatory Visit: Payer: Self-pay

## 2022-04-14 MED ORDER — METOPROLOL TARTRATE 50 MG PO TABS: 50.0000 mg | ORAL_TABLET | Freq: Two times a day (BID) | ORAL | 3 refills | Status: DC

## 2022-05-05 ENCOUNTER — Telehealth: Payer: TRICARE For Life (TFL)

## 2022-05-05 ENCOUNTER — Ambulatory Visit: Payer: Medicare Other

## 2022-05-05 DIAGNOSIS — E1142 Type 2 diabetes mellitus with diabetic polyneuropathy: Secondary | ICD-10-CM

## 2022-05-05 NOTE — Patient Instructions (Signed)
Visit Information  Thank you for taking time to visit with me today. Please don't hesitate to contact me if I can be of assistance to you before our next scheduled telephone appointment.  Following are the goals we discussed today:   Continue to take medications as prescribed   Attend all scheduled provider appointments Call pharmacy for medication refills 3-7 days in advance of running out of medications Call provider office for new concerns or questions  Call and scheduled annual wellness visit with primary care office.  Review education article sent to you in MyChart on Diabetic diet.   Our next appointment is by telephone on 06/23/22 at 10:00 am  Please call the care guide team at [phone] if you need to cancel or reschedule your appointment.   If you are experiencing a Mental Health or [place] or need someone to talk to, please call the Suicide and Crisis Lifeline: 988 call 1-800-273-TALK (toll free, 24 hour hotline)   Patient verbalizes understanding of instructions and care plan provided today and agrees to view in [place]. Active MyChart status and patient understanding of how to access instructions and care plan via MyChart confirmed with patient.     [name] RN,[name],CCM RN Case Manager [place]  [phone]

## 2022-05-05 NOTE — Chronic Care Management (AMB) (Signed)
Care Management    RN Visit Note  05/05/2022 Name: [name] MRN: [id number] DOB: 07/12/1932  Subjective: [name] is a 88 y.o. year old male who is a primary care patient of Tonia Ghent, MD. The care management team was consulted for assistance with disease management and care coordination needs.    Engaged with patient by telephone for follow up visit in response to provider referral for case management and/or care coordination services.   Consent to Services:   [name] was given information about Care Management services today including:  Care Management services includes personalized support from designated clinical staff supervised by his physician, including individualized plan of care and coordination with other care providers 24/7 contact phone numbers for assistance for urgent and routine care needs. The patient may stop case management services at any time by phone call to the office staff.  Patient agreed to services and consent obtained.   Assessment: Review of patient past medical history, allergies, medications, health status, including review of consultants reports, laboratory and other test data, was performed as part of comprehensive evaluation and provision of chronic care management services.   SDOH (Social Determinants of Health) assessments and interventions performed:    Care Plan  No Known Allergies  Outpatient Encounter Medications as of 05/05/2022  Medication Sig   allopurinol (ZYLOPRIM) 100 MG tablet TAKE ONE-HALF (1/2) TABLET EVERY MONDAY, WEDNESDAY AND FRIDAY   amoxicillin (AMOXIL) 500 MG tablet Take by mouth. Patient takes 4 tablets prior to going to the dentist   aspirin 81 MG tablet Take 81 mg by mouth daily.   BD INSULIN SYRINGE ULTRAFINE 31G X 5/16" 0.3 ML MISC USE DAILY AS INSTRUCTED   cetirizine (ZYRTEC) 10 MG tablet Take 10 mg by mouth daily as needed for allergies.   Cholecalciferol (VITAMIN D) 1000 UNITS capsule Take 1,000  Units by mouth daily.    diclofenac Sodium (VOLTAREN) 1 % GEL Apply 2 g topically 4 (four) times daily.   ELIQUIS 2.5 MG TABS tablet TAKE 1 TABLET TWICE A DAY   fish oil-omega-3 fatty acids 1000 MG capsule Take 1 g by mouth daily.   furosemide (LASIX) 80 MG tablet TAKE 1 TABLET TWICE A DAY (DOSAGE CHANGE)   glucose blood (FREESTYLE LITE) test strip USE TO TEST BLOOD SUGAR ONCE DAILY AND AS NEEDED FOR DIABETES MELLITUS   hydrALAZINE (APRESOLINE) 25 MG tablet TAKE 1 TABLET IN THE MORNING AND AT BEDTIME   ipratropium (ATROVENT) 0.03 % nasal spray USE 2 SPRAYS IN EACH NOSTRIL TWICE A DAY AS NEEDED FOR RHINITIS   JARDIANCE 10 MG TABS tablet TAKE 1 TABLET DAILY BEFORE BREAKFAST   LANTUS 100 UNIT/ML injection INJECT 20 TO 25 UNITS AT BEDTIME   magnesium oxide (MAG-OX) 400 MG tablet Take 400 mg by mouth daily.   metoprolol tartrate (LOPRESSOR) 50 MG tablet Take 1 tablet (50 mg total) by mouth 2 (two) times daily.   Multiple Vitamin (MULTIVITAMIN WITH MINERALS) TABS Take 1 tablet by mouth daily.   nitroGLYCERIN (NITROSTAT) 0.4 MG SL tablet Place 1 tablet (0.4 mg total) under the tongue every 5 (five) minutes as needed for chest pain. For a maximum of 3 doses.   Potassium Gluconate 595 MG CAPS Take 595 mg by mouth daily.   ranolazine (RANEXA) 500 MG 12 hr tablet TAKE 1 TABLET TWICE A DAY   rosuvastatin (CRESTOR) 40 MG tablet TAKE 1 TABLET AT BEDTIME   traMADol (ULTRAM) 50 MG tablet Take 1 tablet (50  mg total) by mouth every 6 (six) hours as needed.   No facility-administered encounter medications on file as of 05/05/2022.    Patient Active Problem List   Diagnosis Date Noted   Staphylococcus infection 12/02/2021   Wound infection 11/29/2021   Encounter for removal of sutures 11/29/2021   Laceration of right lower extremity 11/25/2021   Rhinitis 11/01/2020   Severe mitral insufficiency 02/06/2020   S/P mitral valve repair 02/06/2020   PAF (paroxysmal atrial fibrillation) ([name])    CAD (coronary  artery disease) 08/02/2018   Macular degeneration 06/24/2018   Abdominal wall hernia 05/24/2017   History of lower GI bleeding    Pulmonary hypertension ([place])    Acute on chronic diastolic (congestive) heart failure ([place]) 11/20/2015   Gout 09/09/2014   Medicare annual wellness visit, initial 09/09/2014   RBBB (right bundle branch block)    Carotid artery disease ([name])    Hx of CABG    Non-rheumatic mitral regurgitation    CRI (chronic renal insufficiency), stage 4 (severe) ([place]) 03/24/2011   Peripheral sensory neuropathy due to type 2 diabetes mellitus ([name])    GERD    Sleep apnea    Insulin dependent type 2 diabetes mellitus ([place])    HLD (hyperlipidemia)    HTN (hypertension)     Conditions to be addressed/monitored: DMII  Care Plan : [facility] plan of care  Updates made by Dannielle Karvonen, RN since 05/05/2022 12:00 AM     Problem: Chronic disease management education and care coordination needs.   Priority: High     Long-Range Goal: Development of plan of care to address chronic disease management and care coordination needs.   Start Date: 03/15/2022  Expected End Date: 07/14/2022  Priority: High  Note:   Current Barriers:  Knowledge Deficits related to plan of care for management of DMII  Patient states he is doing well. Patient reports today's fasting blood sugar is 128 with an average range of 160.  Patient denies any blood sugars <70.   RNCM Clinical Goal(s):  Patient will verbalize understanding of plan for management of DMII as evidenced by patient self report and/ or notation in chart take all medications exactly as prescribed and will call provider for medication related questions as evidenced by patient self report and/ or notation in chart    attend all scheduled medical appointments:   as evidenced by patient self report and/ or notation in chart        continue to work with [place] and/or Social Worker to address care management and care coordination needs  related to DMII as evidenced by adherence to CM Team Scheduled appointments     through collaboration with Consulting civil engineer, provider, and care team.   Interventions: 1:1 collaboration with primary care provider regarding development and update of comprehensive plan of care as evidenced by provider attestation and co-signature Inter-disciplinary care team collaboration (see longitudinal plan of care) Evaluation of current treatment plan related to  self management and patient's adherence to plan as established by provider   Diabetes Interventions:  (Status:  Goal on track:  Yes.) Long Term Goal Assessed patient's understanding of A1c goal: <7% Reviewed medications with patient and discussed importance of medication adherence Discussed plans with patient for ongoing care management follow up and provided patient with direct contact information for care management team Discussed treatment of hypo/ hyperglycemia Reviewed scheduled/upcoming provider appointments Advised patient to call primary care provider office and schedule annual wellness visit.  Sent patient  education article regarding diabetic diet.  Lab Results  Component Value Date   HGBA1C 9.3 (H) 10/29/2020     Patient Goals/Self-Care Activities: Continue to take medications as prescribed   Attend all scheduled provider appointments Call pharmacy for medication refills 3-7 days in advance of running out of medications Call provider office for new concerns or questions  Call and scheduled annual wellness visit with primary care office.  Review education article sent to you in MyChart on Diabetic diet.        Plan: The patient has been provided with contact information for the care management team and has been advised to call with any health related questions or concerns.  The care management team will reach out to the patient again over the next 2 months .  [name] RN,[name],CCM RN Case Manager [place]   [phone]

## 2022-06-20 ENCOUNTER — Ambulatory Visit: Payer: Self-pay

## 2022-06-20 NOTE — Chronic Care Management (AMB) (Signed)
Care Management    RN Visit Note  06/20/2022 Name: [name] MRN: [id number] DOB: 12-09-1931  Subjective: [name] is a 88 y.o. year old male who is a primary care patient of Jeremiah Ghent, MD. The care management team was consulted for assistance with disease management and care coordination needs.    Engaged with patient by telephone for follow up visit in response to provider referral for case management and/or care coordination services.   Consent to Services:   Mr. Chance was given information about Care Management services today including:  Care Management services includes personalized support from designated clinical staff supervised by his physician, including individualized plan of care and coordination with other care providers 24/7 contact phone numbers for assistance for urgent and routine care needs. The patient may stop case management services at any time by phone call to the office staff.  Patient agreed to services and consent obtained.   Assessment: Review of patient past medical history, allergies, medications, health status, including review of consultants reports, laboratory and other test data, was performed as part of comprehensive evaluation and provision of chronic care management services.   SDOH (Social Determinants of Health) assessments and interventions performed:    Care Plan  No Known Allergies  Outpatient Encounter Medications as of 06/20/2022  Medication Sig   allopurinol (ZYLOPRIM) 100 MG tablet TAKE ONE-HALF (1/2) TABLET EVERY MONDAY, WEDNESDAY AND FRIDAY   amoxicillin (AMOXIL) 500 MG tablet Take by mouth. Patient takes 4 tablets prior to going to the dentist   aspirin 81 MG tablet Take 81 mg by mouth daily.   BD INSULIN SYRINGE ULTRAFINE 31G X 5/16" 0.3 ML MISC USE DAILY AS INSTRUCTED   cetirizine (ZYRTEC) 10 MG tablet Take 10 mg by mouth daily as needed for allergies.   Cholecalciferol (VITAMIN D) 1000 UNITS capsule Take 1,000 Units  by mouth daily.    diclofenac Sodium (VOLTAREN) 1 % GEL Apply 2 g topically 4 (four) times daily.   ELIQUIS 2.5 MG TABS tablet TAKE 1 TABLET TWICE A DAY   fish oil-omega-3 fatty acids 1000 MG capsule Take 1 g by mouth daily.   furosemide (LASIX) 80 MG tablet TAKE 1 TABLET TWICE A DAY (DOSAGE CHANGE)   glucose blood (FREESTYLE LITE) test strip USE TO TEST BLOOD SUGAR ONCE DAILY AND AS NEEDED FOR DIABETES MELLITUS   hydrALAZINE (APRESOLINE) 25 MG tablet TAKE 1 TABLET IN THE MORNING AND AT BEDTIME   ipratropium (ATROVENT) 0.03 % nasal spray USE 2 SPRAYS IN EACH NOSTRIL TWICE A DAY AS NEEDED FOR RHINITIS   JARDIANCE 10 MG TABS tablet TAKE 1 TABLET DAILY BEFORE BREAKFAST   LANTUS 100 UNIT/ML injection INJECT 20 TO 25 UNITS AT BEDTIME   magnesium oxide (MAG-OX) 400 MG tablet Take 400 mg by mouth daily.   metoprolol tartrate (LOPRESSOR) 50 MG tablet Take 1 tablet (50 mg total) by mouth 2 (two) times daily.   Multiple Vitamin (MULTIVITAMIN WITH MINERALS) TABS Take 1 tablet by mouth daily.   nitroGLYCERIN (NITROSTAT) 0.4 MG SL tablet Place 1 tablet (0.4 mg total) under the tongue every 5 (five) minutes as needed for chest pain. For a maximum of 3 doses.   Potassium Gluconate 595 MG CAPS Take 595 mg by mouth daily.   ranolazine (RANEXA) 500 MG 12 hr tablet TAKE 1 TABLET TWICE A DAY   rosuvastatin (CRESTOR) 40 MG tablet TAKE 1 TABLET AT BEDTIME   traMADol (ULTRAM) 50 MG tablet Take 1 tablet (50  mg total) by mouth every 6 (six) hours as needed.   No facility-administered encounter medications on file as of 06/20/2022.    Patient Active Problem List   Diagnosis Date Noted   Staphylococcus infection 12/02/2021   Wound infection 11/29/2021   Encounter for removal of sutures 11/29/2021   Laceration of right lower extremity 11/25/2021   Rhinitis 11/01/2020   Severe mitral insufficiency 02/06/2020   S/P mitral valve repair 02/06/2020   PAF (paroxysmal atrial fibrillation) ([name])    CAD (coronary artery  disease) 08/02/2018   Macular degeneration 06/24/2018   Abdominal wall hernia 05/24/2017   History of lower GI bleeding    Pulmonary hypertension ([place])    Acute on chronic diastolic (congestive) heart failure ([place]) 11/20/2015   Gout 09/09/2014   Medicare annual wellness visit, initial 09/09/2014   RBBB (right bundle branch block)    Carotid artery disease ([name])    Hx of CABG    Non-rheumatic mitral regurgitation    CRI (chronic renal insufficiency), stage 4 (severe) ([place]) 03/24/2011   Peripheral sensory neuropathy due to type 2 diabetes mellitus ([name])    GERD    Sleep apnea    Insulin dependent type 2 diabetes mellitus ([place])    HLD (hyperlipidemia)    HTN (hypertension)     Conditions to be addressed/monitored: DMII  Care Plan : [facility] plan of care  Updates made by Dannielle Karvonen, RN since 06/20/2022 12:00 AM     Problem: Chronic disease management education and care coordination needs.   Priority: High     Long-Range Goal: Development of plan of care to address chronic disease management and care coordination needs.   Start Date: 03/15/2022  Expected End Date: 09/13/2022  Priority: High  Note:   Resolving due to duplicate goal Current Barriers:  Knowledge Deficits related to plan of care for management of DMII  Patient reports blood sugars range from 102 to 180.  He reports his fasting blood sugar today was 149.  Denies any blood sugars below 70.  Patient reports next follow up visit with his kidney doctor is 07/23/22.  Patient states he monitors his blood pressure weekly.  He reports blood pressure range for 120's to 140's/ 60 to 70's.  Patient states today's blood pressure was 128/63.  RNCM Clinical Goal(s):  Patient will verbalize understanding of plan for management of DMII as evidenced by patient self report and/ or notation in chart take all medications exactly as prescribed and will call provider for medication related questions as evidenced by patient self report and/ or  notation in chart    attend all scheduled medical appointments:   as evidenced by patient self report and/ or notation in chart        continue to work with RN Care Manager and/or Social Worker to address care management and care coordination needs related to DMII as evidenced by adherence to CM Team Scheduled appointments     through collaboration with Consulting civil engineer, provider, and care team.   Interventions: 1:1 collaboration with primary care provider regarding development and update of comprehensive plan of care as evidenced by provider attestation and co-signature Inter-disciplinary care team collaboration (see longitudinal plan of care) Evaluation of current treatment plan related to  self management and patient's adherence to plan as established by provider   Diabetes Interventions:  (Status:   goal resolved due to duplicate ) Long Term Goal Assessed patient's understanding of A1c goal: <7% Reviewed medications with patient and discussed importance of  medication adherence Discussed plans with patient for ongoing care management follow up and provided patient with direct contact information for care management team Discussed treatment of hypo/ hyperglycemia Reviewed scheduled/upcoming provider appointments Advised to adhere to ADA/carb modified diet Advised patient to call primary care provider office and schedule annual wellness visit.  Lab Results  Component Value Date   HGBA1C 9.3 (H) 10/29/2020     Patient Goals/Self-Care Activities: Continue to take medications as prescribed   Attend all scheduled provider appointments Call pharmacy for medication refills 3-7 days in advance of running out of medications Call provider office for new concerns or questions  Call and scheduled annual wellness visit with primary care office.  Plan to eat low carbohydrate and low salt meals, watch portion sizes and avoid sugar sweetened drinks.  Continue to monitor blood sugars as recommended by  your provider.        Plan: The care management team will reach out to the patient again over the next 2 months .  [name] RN,[name],CCM RN Care Manager Coordinator [place]  [phone]

## 2022-06-20 NOTE — Patient Instructions (Signed)
Visit Information  Thank you for taking time to visit with me today. Please don't hesitate to contact me if I can be of assistance to you before our next scheduled telephone appointment.  Following are the goals we discussed today:  Continue to take medications as prescribed   Attend all scheduled provider appointments Call pharmacy for medication refills 3-7 days in advance of running out of medications Call provider office for new concerns or questions  Call and scheduled annual wellness visit with primary care office.  Plan to eat low carbohydrate and low salt meals, watch portion sizes and avoid sugar sweetened drinks.  Continue to monitor blood sugars as recommended by your provider.   Our next appointment is by telephone on 08/29/22 at 11:00 am  Please call the care guide team at [phone] if you need to cancel or reschedule your appointment.   If you are experiencing a Mental Health or [place] or need someone to talk to, please call the Suicide and Crisis Lifeline: 988 call 1-800-273-TALK (toll free, 24 hour hotline)   The patient verbalized understanding of instructions, educational materials, and care plan provided today and agreed to receive a mailed copy of patient instructions, educational materials, and care plan.    [name] RN,[name],CCM RN Care Manager Coordinator [place]  [phone]

## 2022-06-23 ENCOUNTER — Telehealth: Payer: TRICARE For Life (TFL)

## 2022-06-29 ENCOUNTER — Other Ambulatory Visit: Payer: Self-pay | Admitting: Cardiovascular Disease

## 2022-07-07 ENCOUNTER — Other Ambulatory Visit (HOSPITAL_COMMUNITY): Payer: Self-pay | Admitting: Internal Medicine

## 2022-08-02 ENCOUNTER — Telehealth: Payer: Self-pay | Admitting: Family Medicine

## 2022-08-02 NOTE — Telephone Encounter (Signed)
Myriam Jacobson from [facility] called over and they are needing an urgent clearance for the patient. They stated he is taking Eliquis and need an extraction done. They will be faxing something over but she said you can also call when you are available. Thank you!

## 2022-08-03 NOTE — Telephone Encounter (Signed)
Called Myriam Jacobson back at dentist office. I have not gotten the form that was faxed over yet; may still be on the fax drive. They just need to know how long patient would need to be off eliquis to do a single tooth extraction? Not scheduled yet until they know how many days he needs to stop medication. They are okay taking a verbal.

## 2022-08-03 NOTE — Telephone Encounter (Signed)
Patient doesn't have recent labs here with A1c for me to review.  Needs OV when possible.    If the extraction has to happen in the meantime, then okay to hold eliquis for 2 days prior to procedure and then restart the med afterward  This is with the condition that other factors could affect his situation- but I can't offer extra guidance w/o seeing the patient first.

## 2022-08-03 NOTE — Telephone Encounter (Signed)
Called and spoke with Myriam Jacobson about message below. She is going to relay information to the dentist and will decide how to proceed. Myriam Jacobson will also let patient know to call us to make an appt to be seen when he can. Form has also been received now and placed in Dr. Josefine Class box.

## 2022-08-14 ENCOUNTER — Other Ambulatory Visit: Payer: Self-pay | Admitting: Family Medicine

## 2022-08-16 ENCOUNTER — Other Ambulatory Visit: Payer: Self-pay

## 2022-08-16 MED ORDER — FREESTYLE LITE TEST VI STRP: ORAL_STRIP | 3 refills | Status: DC

## 2022-08-16 NOTE — Progress Notes (Signed)
[facility]ed [facility] from [facility] for Freestyle lite test strips. Erx sent.

## 2022-08-19 ENCOUNTER — Telehealth: Payer: Self-pay | Admitting: Family Medicine

## 2022-08-19 NOTE — Telephone Encounter (Signed)
  Encourage patient to contact the pharmacy for refills or they can request refills through [facility]  Did the patient contact the pharmacy: Yes  LAST APPOINTMENT DATE: 11/08/2000  NEXT APPOINTMENT DATE: N/A  MEDICATION: traMADol (ULTRAM) 50 MG tablet  glucose blood (FREESTYLE LITE) test strip  Is the patient out of medication? Yes  PHARMACY: [place], [place]  Comment: Patient would like for 1-2 refills to be sent in with the prescriptions.   Let patient know to contact pharmacy at the end of the day to make sure medication is ready.  Please notify patient to allow 48-72 hours to process

## 2022-08-19 NOTE — Telephone Encounter (Signed)
Tramadol last filled 07-01-22 #15 Last OV 12-02-21 No Future OV [facility]

## 2022-08-21 MED ORDER — TRAMADOL HCL 50 MG PO TABS: 50.0000 mg | ORAL_TABLET | Freq: Four times a day (QID) | ORAL | 0 refills | Status: DC | PRN

## 2022-08-21 MED ORDER — FREESTYLE LITE TEST VI STRP: ORAL_STRIP | 3 refills | Status: DC

## 2022-08-21 NOTE — Telephone Encounter (Signed)
Please schedule yearly f/u.

## 2022-08-22 ENCOUNTER — Telehealth: Payer: Self-pay | Admitting: Family Medicine

## 2022-08-22 NOTE — Telephone Encounter (Signed)
Please schedule AWV with Dr. Damita Dunnings after 12/19

## 2022-08-22 NOTE — Telephone Encounter (Signed)
Patient scheduled CPE on 11/03/2022 at 9:30 am and labs on 10/27/2022 at 9:00 am.

## 2022-08-22 NOTE — Telephone Encounter (Signed)
Error

## 2022-08-29 ENCOUNTER — Telehealth: Payer: Self-pay

## 2022-08-29 ENCOUNTER — Ambulatory Visit: Payer: Self-pay

## 2022-08-29 NOTE — Patient Outreach (Signed)
  Care Coordination   Follow Up Visit Note   08/29/2022 Name: ABDUR HOGLUND MRN: [id number] DOB: December 16, 1931  ANDRANIK JEUNE is a 88 y.o. year old male who sees Tonia Ghent, MD for primary care. I spoke with  [name] by phone today.  What matters to the patients health and wellness today?  Patient states his blood sugars range from low 100's to 160.  He states he forgot to check his blood sugar fasting this morning but did check after eating breakfast which was 201.  Patient states he still struggles some with his diet.    Goals Addressed             This Visit's Progress    Patient Stated: Management of diabetes/ decrease Hgb A1c       Care Coordination Interventions: Reviewed medications with patient and discussed importance of medication adherence Discussed plans with patient for ongoing care management follow up and provided patient with direct contact information for care management team Reviewed scheduled/upcoming provider appointments Patient advised to monitor blood sugars daily and record.  Advised to notify provider of frequent blood sugars <70 or >200 Education article on diabetes diet mailed to patient          SDOH assessments and interventions completed:  No     Care Coordination Interventions Activated:  Yes  Care Coordination Interventions:  Yes, provided   Follow up plan: Follow up call scheduled for 10/03/22 at 11 am    Encounter Outcome:  Pt. Visit Completed   [name] RN,[name],CCM [place] [phone] direct line

## 2022-08-29 NOTE — Patient Outreach (Signed)
  Care Coordination   08/29/2022 Name: [name] MRN: [id number] DOB: 01/05/1932   Care Coordination Outreach Attempts:  An unsuccessful telephone outreach was attempted for a scheduled appointment today. HIPAA compliant voice message left with call back phone number.   Follow Up Plan:  Additional outreach attempts will be made to offer the patient care coordination information and services.   Encounter Outcome:  No Answer  Care Coordination Interventions Activated:  No   Care Coordination Interventions:  No, not indicated    [name] RN,[name],CCM [place] [phone] direct line

## 2022-08-30 ENCOUNTER — Other Ambulatory Visit: Payer: Self-pay | Admitting: Family Medicine

## 2022-08-30 NOTE — Telephone Encounter (Signed)
Refill request for TRAMADOL HCL TABS '50MG'$   LOV - 11/22/21 Next OV - 11/03/22 Last refill - 08/21/22 #15/0

## 2022-09-14 DIAGNOSIS — N189 Chronic kidney disease, unspecified: Secondary | ICD-10-CM | POA: Diagnosis not present

## 2022-09-14 DIAGNOSIS — I129 Hypertensive chronic kidney disease with stage 1 through stage 4 chronic kidney disease, or unspecified chronic kidney disease: Secondary | ICD-10-CM | POA: Diagnosis not present

## 2022-09-14 DIAGNOSIS — N184 Chronic kidney disease, stage 4 (severe): Secondary | ICD-10-CM | POA: Diagnosis not present

## 2022-09-14 DIAGNOSIS — E559 Vitamin D deficiency, unspecified: Secondary | ICD-10-CM | POA: Diagnosis not present

## 2022-09-14 DIAGNOSIS — D631 Anemia in chronic kidney disease: Secondary | ICD-10-CM | POA: Diagnosis not present

## 2022-09-14 LAB — BASIC METABOLIC PANEL
Creatinine: 2.8 — AB (ref 0.6–1.3)
Glucose: 132
Potassium: 4.4 mEq/L (ref 3.5–5.1)

## 2022-09-14 LAB — CBC AND DIFFERENTIAL: Hemoglobin: 13.6 (ref 13.5–17.5)

## 2022-09-14 LAB — IRON,TIBC AND FERRITIN PANEL
Ferritin: 104
Iron: 71

## 2022-09-14 LAB — VITAMIN D 25 HYDROXY (VIT D DEFICIENCY, FRACTURES): Vit D, 25-Hydroxy: 57.2

## 2022-09-19 ENCOUNTER — Other Ambulatory Visit: Payer: Self-pay | Admitting: Cardiovascular Disease

## 2022-10-03 ENCOUNTER — Ambulatory Visit: Payer: Self-pay

## 2022-10-03 NOTE — Patient Outreach (Signed)
  Care Coordination   Follow Up Visit Note   10/03/2022 Name: [name] MRN: [id number] DOB: Oct 08, 1932  [name] is a 88 y.o. year old male who sees Tonia Ghent, MD for primary care. I spoke with  [name] by phone today.  What matters to the patients health and wellness today?  Patient reports blood sugars are ranging from 120's to 235.  He states he is not able to exercise due to tiring easily and legs give out if walking long distances. Patient reports yesterday's fasting blood sugar was 143. He states he has not eaten the best recently which has caused his blood sugars to trend up more.  Patient confirmed next follow up visit with primary care provider is 11/03/22.     Goals Addressed             This Visit's Progress    Patient Stated: Management of diabetes/ decrease Hgb A1c       Care Coordination Interventions: Reviewed medications with patient and discussed importance of medication adherence Reviewed scheduled/upcoming provider appointments Advised to continue to monitor blood sugars daily and record.  Advised to notify provider of frequent blood sugars <70 or >200 Advised to take fluid pill as prescribed by provider Discussed importance of decreasing sugar/ carbohydrate intake and managing portion control.  Discussed managing diet during Holiday season.  Advised to weigh daily and record.  Advised to notify provider for a weight gain of 3 lbs overnight or 5 lbs in a weight.  Advised to adhere to a low salt/ sodium diet.           SDOH assessments and interventions completed:  No     Care Coordination Interventions Activated:  Yes  Care Coordination Interventions:  Yes, provided   Follow up plan: Follow up call scheduled for 11/23/21    Encounter Outcome:  Pt. Visit Completed   [name] RN,[name],CCM [place] [phone] direct line

## 2022-10-10 ENCOUNTER — Other Ambulatory Visit: Payer: Self-pay | Admitting: Cardiovascular Disease

## 2022-10-17 ENCOUNTER — Other Ambulatory Visit: Payer: Self-pay | Admitting: Family Medicine

## 2022-10-26 ENCOUNTER — Other Ambulatory Visit: Payer: Self-pay | Admitting: Family Medicine

## 2022-10-26 DIAGNOSIS — E1142 Type 2 diabetes mellitus with diabetic polyneuropathy: Secondary | ICD-10-CM

## 2022-10-27 ENCOUNTER — Other Ambulatory Visit (INDEPENDENT_AMBULATORY_CARE_PROVIDER_SITE_OTHER): Payer: Medicare Other

## 2022-10-27 DIAGNOSIS — E1142 Type 2 diabetes mellitus with diabetic polyneuropathy: Secondary | ICD-10-CM | POA: Diagnosis not present

## 2022-10-27 LAB — COMPREHENSIVE METABOLIC PANEL
ALT: 13 U/L (ref 0–53)
AST: 17 U/L (ref 0–37)
Albumin: 4.1 g/dL (ref 3.5–5.2)
Alkaline Phosphatase: 73 U/L (ref 39–117)
BUN: 42 mg/dL — ABNORMAL HIGH (ref 6–23)
CO2: 33 mEq/L — ABNORMAL HIGH (ref 19–32)
Calcium: 10 mg/dL (ref 8.4–10.5)
Chloride: 100 mEq/L (ref 96–112)
Creatinine, Ser: 2.87 mg/dL — ABNORMAL HIGH (ref 0.40–1.50)
GFR: 18.94 mL/min — ABNORMAL LOW (ref >60.00)
Glucose, Bld: 197 mg/dL — ABNORMAL HIGH (ref 70–99)
Potassium: 4.1 mEq/L (ref 3.5–5.1)
Sodium: 140 mEq/L (ref 135–145)
Total Bilirubin: 0.7 mg/dL (ref 0.2–1.2)
Total Protein: 6.9 g/dL (ref 6.0–8.3)

## 2022-10-27 LAB — CBC WITH DIFFERENTIAL/PLATELET
Basophils Absolute: 0 10*3/uL (ref 0.0–0.1)
Basophils Relative: 0.4 % (ref 0.0–3.0)
Eosinophils Absolute: 0.3 10*3/uL (ref 0.0–0.7)
Eosinophils Relative: 3.4 % (ref 0.0–5.0)
HCT: 36.7 % — ABNORMAL LOW (ref 39.0–52.0)
Hemoglobin: 12.3 g/dL — ABNORMAL LOW (ref 13.0–17.0)
Lymphocytes Relative: 22.3 % (ref 12.0–46.0)
Lymphs Abs: 1.7 10*3/uL (ref 0.7–4.0)
MCHC: 33.5 g/dL (ref 30.0–36.0)
MCV: 96.7 fl (ref 78.0–100.0)
Monocytes Absolute: 0.8 10*3/uL (ref 0.1–1.0)
Monocytes Relative: 9.9 % (ref 3.0–12.0)
Neutro Abs: 5 10*3/uL (ref 1.4–7.7)
Neutrophils Relative %: 64 % (ref 43.0–77.0)
Platelets: 163 10*3/uL (ref 150.0–400.0)
RBC: 3.8 Mil/uL — ABNORMAL LOW (ref 4.22–5.81)
RDW: 15.8 % — ABNORMAL HIGH (ref 11.5–15.5)
WBC: 7.8 10*3/uL (ref 4.0–10.5)

## 2022-10-27 LAB — URIC ACID: Uric Acid, Serum: 7 mg/dL (ref 4.0–7.8)

## 2022-10-27 LAB — LIPID PANEL
Cholesterol: 115 mg/dL (ref 0–200)
HDL: 34.4 mg/dL — ABNORMAL LOW (ref >39.00)
LDL Cholesterol: 45 mg/dL (ref 0–99)
NonHDL: 80.97
Total CHOL/HDL Ratio: 3
Triglycerides: 180 mg/dL — ABNORMAL HIGH (ref 0.0–149.0)
VLDL: 36 mg/dL (ref 0.0–40.0)

## 2022-10-27 LAB — HEMOGLOBIN A1C: Hgb A1c MFr Bld: 8 % — ABNORMAL HIGH (ref 4.6–6.5)

## 2022-11-01 ENCOUNTER — Ambulatory Visit (INDEPENDENT_AMBULATORY_CARE_PROVIDER_SITE_OTHER): Payer: Medicare Other

## 2022-11-01 VITALS — BP 142/64 | HR 71 | Wt 168.0 lb

## 2022-11-01 DIAGNOSIS — Z0000 Encounter for general adult medical examination without abnormal findings: Secondary | ICD-10-CM | POA: Diagnosis not present

## 2022-11-01 NOTE — Progress Notes (Signed)
I connected with  [name] on 11/01/22 by a audio enabled telemedicine application and verified that I am speaking with the correct person using two identifiers.  Patient Location: Home  Provider Location: Office/Clinic  I discussed the limitations of evaluation and management by telemedicine. The patient expressed understanding and agreed to proceed.   Subjective:   [name] is a 88 y.o. male who presents for Medicare Annual/Subsequent preventive examination.  Review of Systems     Cardiac Risk Factors include: advanced age (>29mn, >>27women);diabetes mellitus;dyslipidemia;male gender;hypertension     Objective:    Today's Vitals   11/01/22 1049  BP: (!) 142/64  Pulse: 71  Weight: 168 lb (76.2 kg)   Body mass index is 26.31 kg/m.     11/01/2022   10:55 AM 03/15/2022   10:06 AM 10/28/2021    9:58 AM 07/20/2020   11:06 AM 02/06/2020    6:30 AM 02/03/2020    9:45 AM 01/20/2020   11:55 AM  Advanced Directives  Does Patient Have a Medical Advance Directive? Yes Yes Yes Yes Yes Yes   Type of AParamedicof ASacred Heart UniversityLiving will HAbbevilleLiving will HBlissLiving will HGarden GroveLiving will HNew EraLiving will HCass CityLiving will HBig SandyLiving will  Does patient want to make changes to medical advance directive?  No - Patient declined Yes (MAU/Ambulatory/Procedural Areas - Information given)  No - Patient declined    Copy of [place] Chart? No - copy requested   No - copy requested No - copy requested No - copy requested     Current Medications (verified) Outpatient Encounter Medications as of 11/01/2022  Medication Sig   allopurinol (ZYLOPRIM) 100 MG tablet TAKE ONE-HALF (1/2) TABLET EVERY MONDAY, WEDNESDAY AND FRIDAY   amoxicillin (AMOXIL) 500 MG tablet Take by mouth. Patient takes 4 tablets prior to  going to the dentist   aspirin 81 MG tablet Take 81 mg by mouth daily.   BD INSULIN SYRINGE ULTRAFINE 31G X 5/16" 0.3 ML MISC USE DAILY AS INSTRUCTED   cetirizine (ZYRTEC) 10 MG tablet Take 10 mg by mouth daily as needed for allergies.   Cholecalciferol (VITAMIN D) 1000 UNITS capsule Take 1,000 Units by mouth daily.    diclofenac Sodium (VOLTAREN) 1 % GEL Apply 2 g topically 4 (four) times daily.   ELIQUIS 2.5 MG TABS tablet TAKE 1 TABLET TWICE A DAY   empagliflozin (JARDIANCE) 10 MG TABS tablet Take 1 tablet (10 mg total) by mouth daily before breakfast. PLEASE CALL THE OFFICE TO ARRANGE YOUR FOLLOW UP APPOINTMENT   fish oil-omega-3 fatty acids 1000 MG capsule Take 1 g by mouth daily.   furosemide (LASIX) 80 MG tablet TAKE 1 TABLET TWICE A DAY (DOSAGE CHANGE)   glucose blood (FREESTYLE LITE) test strip USE TO TEST BLOOD SUGAR ONCE DAILY AND AS NEEDED. Dx E11.9   hydrALAZINE (APRESOLINE) 25 MG tablet TAKE 1 TABLET IN THE MORNING AND AT BEDTIME   ipratropium (ATROVENT) 0.03 % nasal spray USE 2 SPRAYS IN EACH NOSTRIL TWICE A DAY AS NEEDED FOR RHINITIS   LANTUS 100 UNIT/ML injection INJECT 20 TO 25 UNITS AT BEDTIME   magnesium oxide (MAG-OX) 400 MG tablet Take 400 mg by mouth daily.   metoprolol tartrate (LOPRESSOR) 50 MG tablet TAKE 1 TABLET(50 MG) BY MOUTH TWICE DAILY   Multiple Vitamin (MULTIVITAMIN WITH MINERALS) TABS Take 1 tablet by mouth daily.  nitroGLYCERIN (NITROSTAT) 0.4 MG SL tablet DISSOLVE 1 TABLET UNDER THE TONGUE EVERY 5 MINUTES AS NEEDED FOR CHEST PAIN FOR A MAXIMUM OF 3 DOSES   Potassium Gluconate 595 MG CAPS Take 595 mg by mouth daily.   ranolazine (RANEXA) 500 MG 12 hr tablet TAKE 1 TABLET TWICE A DAY   rosuvastatin (CRESTOR) 40 MG tablet TAKE 1 TABLET AT BEDTIME   traMADol (ULTRAM) 50 MG tablet TAKE 1 TABLET EVERY 6 HOURS AS NEEDED   valACYclovir (VALTREX) 1000 MG tablet Take by mouth. (Patient not taking: Reported on 11/01/2022)   No facility-administered encounter  medications on file as of 11/01/2022.    Allergies (verified) Patient has no known allergies.   History: Past Medical History:  Diagnosis Date   Age-related macular degeneration, wet, both eyes ([place])    Anemia    Secondary to acute blood loss   Anginal pain ([name])    last chest pain in Feb 2021   Arthritis    "mild in hands, knees, ankles" (11/13/2015)   Atrial fibrillation ([place])    Consideration was given for atrial flutter ablation, but patient developed atrial fibrillation. Cardioversion was done. Dr. Caryl Comes decided to watch him clinically. November, 2011   Atrial flutter [facility]) 07/2010   September, 2011   Hospital with PNA and cath done.Marland KitchenMarland KitchenCoumadin.  Atrial flutter ablation planned, but  pt. then had atrial fibrillation,/outpatient conversion 09/08/10..NSR..plan to follow..Dr. Caryl Comes   CAD (coronary artery disease)    Catheterization, September, 2011,  grafts patent from redo CABG,, medical therapy of coronary disease, consideration to proceeding with atrial flutter ablation   Carotid artery disease ([place])    Doppler 09/18/2009 - 49% bilateral stenoses   Carotid artery disease ([name])    49% bilateral, Doppler, November, 2010   CHF (congestive heart failure) ([place])    Chronic kidney disease (CKD), stage III (moderate) ([place])    Diabetic peripheral neuropathy ([place])    feet   Diverticulosis of colon with hemorrhage 2009   several unit diverticular bleed    Erectile dysfunction    Mild   Gout    Hearing loss    wears hearing aids   History of blood transfusion "several"   related to diverticular bleeding   Hyperlipidemia    Hypertension    Mitral regurgitation    Mild, echo, September, 2011   NSTEMI (non-ST elevated myocardial infarction) ([place]) 07/2010   at [facility] with repeat cath, rec medical mgmt    OSA on CPAP    uses CPAP nightly   Personal history of colonic polyps    PNA (pneumonia) 9/11   NSTEMI at [facility] with repeat cath, rec medical mgmt    RBBB (right bundle branch block)     S/P mitral valve repair 02/06/2020   s/p TEER with a single MitraClip NTW placed on A2/P2   Shoulder pain    "positional; better now" (11/13/2015)   Skin cancer    R lower leg, per derm 2012   Type II diabetes mellitus ([place])    Past Surgical History:  Procedure Laterality Date   CARDIAC CATHETERIZATION  2006   Nuclear..slight lateral ischemia..medical therapy   CARDIAC CATHETERIZATION  08/04/2010   grafts patent from redo CABG...medical Rx and ablate Atrial flutter (LV not injected)    CARDIOVERSION  ~ 2010   CAROTID ENDARTERECTOMY Right 1994   CATARACT EXTRACTION W/ INTRAOCULAR LENS  IMPLANT, BILATERAL Bilateral    COLON RESECTION N/A 01/13/2016   Procedure: EXPLORATORY LAPAROTOMY, LEFT AND SIGMOID COLON REMOVAL;  Surgeon:  Ralene Ok, MD;  Location: [place];  Service: General;  Laterality: N/A;  Extended open left hemicolectomy and sigmoidectomy    COLONOSCOPY N/A 11/14/2015   Procedure: COLONOSCOPY;  Surgeon: Manus Gunning, MD;  Location: [place];  Service: Gastroenterology;  Laterality: N/A;   COLONOSCOPY Left 01/05/2016   Procedure: COLONOSCOPY;  Surgeon: Manus Gunning, MD;  Location: [place];  Service: Gastroenterology;  Laterality: Left;  no sedation to start, moderate if needed   COLONOSCOPY W/ POLYPECTOMY     CORONARY ARTERY BYPASS GRAFT  1995; 2006   "X 3; X3"   CORONARY ARTERY BYPASS GRAFT  1995, 2006   DOPPLER ECHOCARDIOGRAPHY  08/2008   EF 60%   DOPPLER ECHOCARDIOGRAPHY  08/02/2010   65-70%   DOPPLER ECHOCARDIOGRAPHY  07/2010   MR mild   ESOPHAGOGASTRODUODENOSCOPY N/A 06/19/2014   Procedure: ESOPHAGOGASTRODUODENOSCOPY (EGD);  Surgeon: Jerene Bears, MD;  Location: [facility] ENDOSCOPY;  Service: Endoscopy;  Laterality: N/A;   ESOPHAGOGASTRODUODENOSCOPY N/A 11/14/2015   Procedure: ESOPHAGOGASTRODUODENOSCOPY (EGD);  Surgeon: Manus Gunning, MD;  Location: [place];  Service: Gastroenterology;  Laterality: N/A;   FLEXIBLE SIGMOIDOSCOPY N/A  11/16/2015   Procedure: FLEXIBLE SIGMOIDOSCOPY;  Surgeon: Manus Gunning, MD;  Location[facility]COPY;  Service: Gastroenterology;  Laterality: N/A;   LAPAROSCOPIC CHOLECYSTECTOMY  2008   LEFT HEART CATH N/A 06/14/2014   Procedure: LEFT HEART CATH;  Surgeon: Sinclair Grooms, MD;  Location: [place] CATH LAB;  Service: Cardiovascular;  Laterality: N/A;   LEFT HEART CATH AND CORONARY ANGIOGRAPHY N/A 08/02/2018   Procedure: LEFT HEART CATH AND CORONARY ANGIOGRAPHY;  Surgeon: [name], MD;  Location: [place] CV LAB;  Service: Cardiovascular;  Laterality: N/A;   LEFT HEART CATHETERIZATION WITH CORONARY/GRAFT ANGIOGRAM N/A 06/11/2014   Procedure: LEFT HEART CATHETERIZATION WITH Beatrix Fetters;  Surgeon: Sinclair Grooms, MD;  Location: [facility] CATH LAB;  Service: Cardiovascular;  Laterality: N/A;   MITRAL VALVE REPAIR N/A 02/06/2020   Procedure: MITRAL VALVE REPAIR;  Surgeon: Sherren Mocha, MD;  Location: [place] CV LAB;  Service: Cardiovascular;  Laterality: N/A;   PERCUTANEOUS CORONARY STENT INTERVENTION (PCI-S) N/A 06/13/2014   Procedure: PERCUTANEOUS CORONARY STENT INTERVENTION (PCI-S);  Surgeon: Sinclair Grooms, MD;  Location: [facility] CATH LAB;  Service: Cardiovascular;  Laterality: N/A;   RIGHT HEART CATH N/A 01/06/2020   Procedure: RIGHT HEART CATH;  Surgeon: Jolaine Artist, MD;  Location: [place] CV LAB;  Service: Cardiovascular;  Laterality: N/A;   SKIN CANCER EXCISION Left 10/2015   calf   SKIN CANCER EXCISION Right 2014?   chest   TEE WITHOUT CARDIOVERSION N/A 01/06/2020   Procedure: TRANSESOPHAGEAL ECHOCARDIOGRAM (TEE);  Surgeon: Jolaine Artist, MD;  Location: [facility] ENDOSCOPY;  Service: Cardiovascular;  Laterality: N/A;   TONSILLECTOMY     VASECTOMY     Family History  Problem Relation Age of Onset   Kidney disease Mother        Kidney failure   Stroke Mother    Diabetes Mother    Heart disease Father        MI   Arthritis Sister    Cancer Sister         Throat   Heart disease Sister        MI   Diabetes Sister    Prostate cancer Neg Hx    Colon cancer Neg Hx    Social History   Socioeconomic History   Marital status: Widowed    Spouse name: Not  on file   Number of children: 2   Years of education: Not on file   Highest education level: Not on file  Occupational History   Occupation: Retired Teacher, 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     Lives with girlfriend [name].  Widowed 12/2005.   Social Determinants of Health   Financial Resource Strain: Low Risk  (11/01/2022)   Overall Financial Resource Strain (CARDIA)    Difficulty of Paying Living Expenses: Not hard at all  Food Insecurity: No Food Insecurity (11/01/2022)   Hunger Vital Sign    Worried About Running Out of Food in the Last Year: Never true    Ran Out of Food in the Last Year: Never true  Transportation Needs: No Transportation Needs (11/01/2022)   PRAPARE - Hydrologist (Medical): No    Lack of Transportation (Non-Medical): No  Physical Activity: Inactive (11/01/2022)   Exercise Vital Sign    Days of Exercise per Week: 0 days    Minutes of Exercise per Session: 0 min  Stress: No Stress Concern Present (11/01/2022)   [place]    Feeling of Stress : Not at all  Social Connections:  Moderately Isolated (11/01/2022)   Social Connection and Isolation Panel [NHANES]    Frequency of Communication with Friends and Family: More than three times a week    Frequency of Social Gatherings with Friends and Family: More than three times a week    Attends Religious Services: Never    Marine scientist or Organizations: Yes    Attends Archivist Meetings: 1 to 4 times per year    Marital Status: Widowed    Tobacco Counseling Counseling given: Not Answered Tobacco comments: "quit smoking cigarettes in 1958"   Clinical Intake:  Pre-visit preparation completed: Yes  Pain : No/denies pain     BMI - recorded: 26.31 Nutritional Status: BMI 25 -29 Overweight Nutritional Risks: None Diabetes: Yes CBG done?: Yes (182 per pt) CBG resulted in Enter/ Edit results?: No Did pt. bring in CBG monitor from home?: No  How often do you need to have someone help you when you read instructions, pamphlets, or other written materials from your doctor or pharmacy?: 1 - Never  Diabetic?Nutrition Risk Assessment:  Has the patient had any N/V/D within the last 2 months?  No  Does the patient have any non-healing wounds?  No  Has the patient had any unintentional weight loss or weight gain?  No   Diabetes:  Is the patient diabetic?  Yes  If diabetic, was a CBG obtained today?  Yes  Did the patient bring in their glucometer from home?  No  How  often do you monitor your CBG's? daily.   Financial Strains and Diabetes Management:  Are you having any financial strains with the device, your supplies or your medication? No .  Does the patient want to be seen by Chronic Care Management for management of their diabetes?  No  Would the patient like to be referred to a Nutritionist or for Diabetic Management?  No   Diabetic Exams:  Diabetic Eye Exam: Overdue for diabetic eye exam. Pt has been advised about the importance in completing this exam. Patient advised to call and  schedule an eye exam. Diabetic Foot Exam: Overdue, Pt has been advised about the importance in completing this exam. Pt is scheduled for diabetic foot exam on appt on Thursday .   Interpreter Needed?: No  Information entered by :: Charlott Rakes, LPN   Activities of Daily Living    11/01/2022   10:57 AM  In your present state of health, do you have any difficulty performing the following activities:  Hearing? 1  Vision? 0  Difficulty concentrating or making decisions? 0  Walking or climbing stairs? 1  Dressing or bathing? 0  Doing errands, shopping? 0  Preparing Food and eating ? N  Using the Toilet? N  In the past six months, have you accidently leaked urine? N  Do you have problems with loss of bowel control? N  Managing your Medications? N  Managing your Finances? N  Housekeeping or managing your Housekeeping? N    Patient Care Team: Tonia Ghent, MD as PCP - General Gwenlyn Found Pearletha Forge, MD as PCP - Cardiology (Cardiology) Royann Shivers, MD as Referring Physician (Nephrology) [name], MD as Consulting Physician (Cardiology) Barbaraann Cao, OD as Referring Physician (Optometry) Allyn Kenner, MD as Consulting Physician (Dermatology) Dannielle Karvonen, RN as [place] any recent Medical Services you may have received from other than [facility] providers in the past year (date may be approximate).     Assessment:   This is a routine wellness examination for Fed.  Hearing/Vision screen Hearing Screening - Comments:: Pt wears hearing aids  Vision Screening - Comments:: Pt follows up with Dr Sabra Heck for annual eye exams   Dietary issues and exercise activities discussed: Current Exercise Habits: The patient does not participate in regular exercise at present   Goals Addressed             This Visit's Progress    Patient Stated       Get weight back to 160 lbs        Depression Screen    11/01/2022    10:53 AM 03/15/2022   10:15 AM 10/28/2021   10:06 AM 10/29/2020   12:33 PM 06/21/2019   10:37 AM 05/09/2018   11:24 AM 05/08/2017    3:12 PM  PHQ 2/9 Scores  PHQ - 2 Score 0 0 0 0 0 0 0  PHQ- 9 Score     2 0     Fall Risk    11/01/2022   10:56 AM 03/15/2022   10:13 AM 10/28/2021   10:01 AM 10/29/2020   12:33 PM 06/21/2019   10:37 AM  Fall Risk   Falls in the past year? '1 1 1 '$ 0 0  Number falls in past yr: 1 1 0 0   Injury with Fall? 1 1 0 0   Comment leg needed 7 stitches      Risk for fall due to : Impaired mobility  Other (Comment) Other (Comment)  Impaired balance/gait;Medication side effect  Risk for fall due to: Comment  patient states fall from "missed footing" foot slipped    Follow up Falls prevention discussed Falls prevention discussed;Education provided Falls prevention discussed Falls evaluation completed Falls evaluation completed;Falls prevention discussed    FALL RISK PREVENTION PERTAINING TO THE HOME:  Any stairs in or around the home? Yes  If so, are there any without handrails? No  Home free of loose throw rugs in walkways, pet beds, electrical cords, etc? Yes  Adequate lighting in your home to reduce risk of falls? Yes   ASSISTIVE DEVICES UTILIZED TO PREVENT FALLS:  Life alert? No  Use of a cane, walker or w/c? Yes  Grab bars in the bathroom? Yes  Shower chair or bench in shower? No  Elevated toilet seat or a handicapped toilet? yes  TIMED UP AND GO:  Was the test performed? No .   Cognitive Function:    06/21/2019   10:43 AM 05/09/2018   11:32 AM 05/08/2017    2:45 PM 03/07/2016    3:20 PM  MMSE - Mini Mental State Exam  Orientation to time '5 5 5 5  '$ Orientation to Place '5 5 5 5  '$ Registration '3 3 3 3  '$ Attention/ Calculation 5 0 0 0  Recall '2 3 3 3  '$ Recall-comments missed one     Language- name 2 objects 0 0 0 0  Language- repeat '1 1 1 1  '$ Language- follow 3 step command 0 '3 3 3  '$ Language- read & follow direction 0 0 0 0  Write a sentence 0 0 0 0   Copy design 0 0 0 0  Total score '21 20 20 20        '$ 11/01/2022   10:58 AM  6CIT Screen  What Year? 0 points  What month? 0 points  What time? 0 points  Count back from 20 0 points  Months in reverse 0 points  Repeat phrase 0 points  Total Score 0 points    Immunizations Immunization History  Administered Date(s) Administered   Influenza Split 09/05/2011, 09/24/2012, 07/17/2014   Influenza Whole 09/14/2006, 09/12/2007   Influenza,inj,Quad PF,6+ Mos 09/19/2013, 08/31/2015, 07/07/2016   Influenza-Unspecified 09/29/2020, 08/04/2021, 08/06/2022   PFIZER(Purple Top)SARS-COV-2 Vaccination 12/20/2019, 01/10/2020, 09/22/2020, 08/06/2022   Pfizer Covid-19 Vaccine Bivalent Booster 75yr & up 08/04/2021   Pneumococcal Conjugate-13 04/28/2015   Pneumococcal Polysaccharide-23 09/14/2002   Td 12/10/1998, 03/16/2009, 06/25/2019   Zoster, Live 10/04/2007    TDAP status: Up to date  Flu Vaccine status: Up to date  Pneumococcal vaccine status: Up to date  Covid-19 vaccine status: Completed vaccines  Qualifies for Shingles Vaccine? Yes   Zostavax completed No   Shingrix Completed?: No.    Education has been provided regarding the importance of this vaccine. Patient has been advised to call insurance company to determine out of pocket expense if they have not yet received this vaccine. Advised may also receive vaccine at local pharmacy or Health Dept. Verbalized acceptance and understanding.  Screening Tests Health Maintenance  Topic Date Due   Zoster Vaccines- Shingrix (1 of 2) Never done   OPHTHALMOLOGY EXAM  04/14/2019   FOOT EXAM  10/29/2021   COVID-19 Vaccine (6 - 2023-24 season) 10/01/2022   HEMOGLOBIN A1C  04/28/2023   Medicare Annual Wellness (AWV)  11/02/2023   DTaP/Tdap/Td (4 - Tdap) 06/24/2029   Pneumonia Vaccine 88 Years old  Completed   INFLUENZA VACCINE  Completed  HPV VACCINES  Aged Out    Health Maintenance  Health Maintenance Due  Topic Date Due   Zoster  Vaccines- Shingrix (1 of 2) Never done   OPHTHALMOLOGY EXAM  04/14/2019   FOOT EXAM  10/29/2021   COVID-19 Vaccine (6 - 2023-24 season) 10/01/2022    Colorectal cancer screening: No longer required.     Additional Screening:   Vision Screening: Recommended annual ophthalmology exams for early detection of glaucoma and other disorders of the eye. Is the patient up to date with their annual eye exam?  No  Who is the provider or what is the name of the office in which the patient attends annual eye exams? Encouraged pt to follow up  If pt is not established with a provider, would they like to be referred to a provider to establish care? No .   Dental Screening: Recommended annual dental exams for proper oral hygiene  Community Resource Referral / Chronic Care Management: CRR required this visit?  No   CCM required this visit?  No      Plan:     I have personally reviewed and noted the following in the patient's chart:   Medical and social history Use of alcohol, tobacco or illicit drugs  Current medications and supplements including opioid prescriptions. Patient is currently taking opioid prescriptions. Information provided to patient regarding non-opioid alternatives. Patient advised to discuss non-opioid treatment plan with their provider. Functional ability and status Nutritional status Physical activity Advanced directives List of other physicians Hospitalizations, surgeries, and ER visits in previous 12 months Vitals Screenings to include cognitive, depression, and falls Referrals and appointments  In addition, I have reviewed and discussed with patient certain preventive protocols, quality metrics, and best practice recommendations. A written personalized care plan for preventive services as well as general preventive health recommendations were provided to patient.     Willette Brace, LPN   [id number]   Nurse Notes: none

## 2022-11-01 NOTE — Patient Instructions (Signed)
[name] , Thank you for taking time to come for your Medicare Wellness Visit. I appreciate your ongoing commitment to your health goals. Please review the following plan we discussed and let me know if I can assist you in the future.   These are the goals we discussed:  Goals      DIET - REDUCE SUGAR INTAKE     06/21/2019, wants to cut back on sweets     Patient Stated     Starting 05/08/2018, I will continue to take medications as prescribed.      Patient Stated     Would like to cut back on sugar intake     Patient Stated     Get weight back to 160 lbs      Patient Stated: Management of diabetes/ decrease Hgb A1c     Care Coordination Interventions: Reviewed medications with patient and discussed importance of medication adherence Reviewed scheduled/upcoming provider appointments Advised to continue to monitor blood sugars daily and record.  Advised to notify provider of frequent blood sugars <70 or >200 Advised to take fluid pill as prescribed by provider Discussed importance of decreasing sugar/ carbohydrate intake and managing portion control.  Discussed managing diet during Holiday season.  Advised to weigh daily and record.  Advised to notify provider for a weight gain of 3 lbs overnight or 5 lbs in a weight.  Advised to adhere to a low salt/ sodium diet.           This is a list of the screening recommended for you and due dates:  Health Maintenance  Topic Date Due   Zoster (Shingles) Vaccine (1 of 2) Never done   Eye exam for diabetics  04/14/2019   Complete foot exam   10/29/2021   COVID-19 Vaccine (6 - 2023-24 season) 10/01/2022   Hemoglobin A1C  04/28/2023   Medicare Annual Wellness Visit  11/02/2023   DTaP/Tdap/Td vaccine (4 - Tdap) 06/24/2029   Pneumonia Vaccine  Completed   Flu Shot  Completed   HPV Vaccine  Aged Out    Advanced directives: Please bring a copy of your health care power of attorney and living will to the office at your  convenience.  Conditions/risks identified: get down to 160 lbs   Next appointment: Follow up in one year for your annual wellness visit.   Preventive Care 16 Years and Older, Male  Preventive care refers to lifestyle choices and visits with your health care provider that can promote health and wellness. What does preventive care include? A yearly physical exam. This is also called an annual well check. Dental exams once or twice a year. Routine eye exams. Ask your health care provider how often you should have your eyes checked. Personal lifestyle choices, including: Daily care of your teeth and gums. Regular physical activity. Eating a healthy diet. Avoiding tobacco and drug use. Limiting alcohol use. Practicing safe sex. Taking low doses of aspirin every day. Taking vitamin and mineral supplements as recommended by your health care provider. What happens during an annual well check? The services and screenings done by your health care provider during your annual well check will depend on your age, overall health, lifestyle risk factors, and family history of disease. Counseling  Your health care provider may ask you questions about your: Alcohol use. Tobacco use. Drug use. Emotional well-being. Home and relationship well-being. Sexual activity. Eating habits. History of falls. Memory and ability to understand (cognition). Work and work Statistician. Screening  You  may have the following tests or measurements: Height, weight, and BMI. Blood pressure. Lipid and cholesterol levels. These may be checked every 5 years, or more frequently if you are over 67 years old. Skin check. Lung cancer screening. You may have this screening every year starting at age 40 if you have a 30-pack-year history of smoking and currently smoke or have quit within the past 15 years. Fecal occult blood test (FOBT) of the stool. You may have this test every year starting at age 64. Flexible  sigmoidoscopy or colonoscopy. You may have a sigmoidoscopy every 5 years or a colonoscopy every 10 years starting at age 34. Prostate cancer screening. Recommendations will vary depending on your family history and other risks. Hepatitis C blood test. Hepatitis B blood test. Sexually transmitted disease (STD) testing. Diabetes screening. This is done by checking your blood sugar (glucose) after you have not eaten for a while (fasting). You may have this done every 1-3 years. Abdominal aortic aneurysm (AAA) screening. You may need this if you are a current or former smoker. Osteoporosis. You may be screened starting at age 40 if you are at high risk. Talk with your health care provider about your test results, treatment options, and if necessary, the need for more tests. Vaccines  Your health care provider may recommend certain vaccines, such as: Influenza vaccine. This is recommended every year. Tetanus, diphtheria, and acellular pertussis (Tdap, Td) vaccine. You may need a Td booster every 10 years. Zoster vaccine. You may need this after age 38. Pneumococcal 13-valent conjugate (PCV13) vaccine. One dose is recommended after age 49. Pneumococcal polysaccharide (PPSV23) vaccine. One dose is recommended after age 1. Talk to your health care provider about which screenings and vaccines you need and how often you need them. This information is not intended to replace advice given to you by your health care provider. Make sure you discuss any questions you have with your health care provider. Document Released: 11/27/2015 Document Revised: 07/20/2016 Document Reviewed: 09/01/2015 Elsevier Interactive Patient Education  2017 [employer] Prevention in the Home Falls can cause injuries. They can happen to people of all ages. There are many things you can do to make your home safe and to help prevent falls. What can I do on the outside of my home? Regularly fix the edges of walkways and  driveways and fix any cracks. Remove anything that might make you trip as you walk through a door, such as a raised step or threshold. Trim any bushes or trees on the path to your home. Use bright outdoor lighting. Clear any walking paths of anything that might make someone trip, such as rocks or tools. Regularly check to see if handrails are loose or broken. Make sure that both sides of any steps have handrails. Any raised decks and porches should have guardrails on the edges. Have any leaves, snow, or ice cleared regularly. Use sand or salt on walking paths during winter. Clean up any spills in your garage right away. This includes oil or grease spills. What can I do in the bathroom? Use night lights. Install grab bars by the toilet and in the tub and shower. Do not use towel bars as grab bars. Use non-skid mats or decals in the tub or shower. If you need to sit down in the shower, use a plastic, non-slip stool. Keep the floor dry. Clean up any water that spills on the floor as soon as it happens. Remove soap buildup in the  tub or shower regularly. Attach bath mats securely with double-sided non-slip rug tape. Do not have throw rugs and other things on the floor that can make you trip. What can I do in the bedroom? Use night lights. Make sure that you have a light by your bed that is easy to reach. Do not use any sheets or blankets that are too big for your bed. They should not hang down onto the floor. Have a firm chair that has side arms. You can use this for support while you get dressed. Do not have throw rugs and other things on the floor that can make you trip. What can I do in the kitchen? Clean up any spills right away. Avoid walking on wet floors. Keep items that you use a lot in easy-to-reach places. If you need to reach something above you, use a strong step stool that has a grab bar. Keep electrical cords out of the way. Do not use floor polish or wax that makes floors  slippery. If you must use wax, use non-skid floor wax. Do not have throw rugs and other things on the floor that can make you trip. What can I do with my stairs? Do not leave any items on the stairs. Make sure that there are handrails on both sides of the stairs and use them. Fix handrails that are broken or loose. Make sure that handrails are as long as the stairways. Check any carpeting to make sure that it is firmly attached to the stairs. Fix any carpet that is loose or worn. Avoid having throw rugs at the top or bottom of the stairs. If you do have throw rugs, attach them to the floor with carpet tape. Make sure that you have a light switch at the top of the stairs and the bottom of the stairs. If you do not have them, ask someone to add them for you. What else can I do to help prevent falls? Wear shoes that: Do not have high heels. Have rubber bottoms. Are comfortable and fit you well. Are closed at the toe. Do not wear sandals. If you use a stepladder: Make sure that it is fully opened. Do not climb a closed stepladder. Make sure that both sides of the stepladder are locked into place. Ask someone to hold it for you, if possible. Clearly mark and make sure that you can see: Any grab bars or handrails. First and last steps. Where the edge of each step is. Use tools that help you move around (mobility aids) if they are needed. These include: Canes. Walkers. Scooters. Crutches. Turn on the lights when you go into a dark area. Replace any light bulbs as soon as they burn out. Set up your furniture so you have a clear path. Avoid moving your furniture around. If any of your floors are uneven, fix them. If there are any pets around you, be aware of where they are. Review your medicines with your doctor. Some medicines can make you feel dizzy. This can increase your chance of falling. Ask your doctor what other things that you can do to help prevent falls. This information is not  intended to replace advice given to you by your health care provider. Make sure you discuss any questions you have with your health care provider. Document Released: 08/27/2009 Document Revised: 04/07/2016 Document Reviewed: 12/05/2014 Elsevier Interactive Patient Education  2017 [employer].

## 2022-11-03 ENCOUNTER — Encounter: Payer: Self-pay | Admitting: Family Medicine

## 2022-11-03 ENCOUNTER — Ambulatory Visit (INDEPENDENT_AMBULATORY_CARE_PROVIDER_SITE_OTHER): Payer: Medicare Other | Admitting: Family Medicine

## 2022-11-03 VITALS — BP 124/62 | HR 66 | Temp 97.1°F | Ht 67.0 in | Wt 171.0 lb

## 2022-11-03 DIAGNOSIS — Z794 Long term (current) use of insulin: Secondary | ICD-10-CM

## 2022-11-03 DIAGNOSIS — L989 Disorder of the skin and subcutaneous tissue, unspecified: Secondary | ICD-10-CM | POA: Diagnosis not present

## 2022-11-03 DIAGNOSIS — E119 Type 2 diabetes mellitus without complications: Secondary | ICD-10-CM

## 2022-11-03 DIAGNOSIS — Z7189 Other specified counseling: Secondary | ICD-10-CM

## 2022-11-03 DIAGNOSIS — N184 Chronic kidney disease, stage 4 (severe): Secondary | ICD-10-CM

## 2022-11-03 DIAGNOSIS — I6523 Occlusion and stenosis of bilateral carotid arteries: Secondary | ICD-10-CM

## 2022-11-03 DIAGNOSIS — E782 Mixed hyperlipidemia: Secondary | ICD-10-CM

## 2022-11-03 DIAGNOSIS — M109 Gout, unspecified: Secondary | ICD-10-CM | POA: Diagnosis not present

## 2022-11-03 DIAGNOSIS — I10 Essential (primary) hypertension: Secondary | ICD-10-CM

## 2022-11-03 NOTE — Progress Notes (Signed)
He had some right hip/lower back pain over the last 3 months but this has gotten better in the meantime.  Since this is improving I asked him to update me as needed.  H/o gout.  Taking allopurinol 1/2 tab MWF at baseline.  Had a flare about 1 week ago.  D/w pt about diet and trigger avoidance.    Hypertension:    Using medication without problems or lightheadedness: yes Chest pain with exertion:no Edema:no Short of breath: with exertion.  Worse in the last 2 months.  He had missed some doses of lasix.    Elevated Cholesterol: Using medications without problems:yes Muscle aches: not likely from statin.  D/w pt. See above.   Diet compliance: d/w pt.  Exercise: d/w pt.    Diabetes:  Using medications without difficulties: yes Hypoglycemic episodes: no Hyperglycemic episodes: no Feet problems: he has altered sensation at baseline.   Blood Sugars averaging: ~150-200 eye exam within last year: due, d/w pt.   A1c 8, improved.   CKD.  Cr d/w pt. followed by outside clinic.  Discussed with patient about Lasix use.  See above.  Both children designated if patient were incapacitated.    Meds, vitals, and allergies reviewed.   PMH and SH reviewed  ROS: Per HPI unless specifically indicated in ROS section   GEN: nad, alert and oriented HEENT: ncat NECK: supple w/o LA CV: IRR with murmur noted.   PULM: ctab, no inc wob ABD: soft, +bs EXT: 1+ BLE edema SKIN: chronic changes R shin.   Scabbed lesion R shin.  1x1 cm.   Possible BCC L chest wall.    Diabetic foot exam: Normal inspection No skin breakdown No calluses  dec DP pulses dec sensation to light touch and monofilament Nails thickened

## 2022-11-03 NOTE — Patient Instructions (Signed)
Call Dr. Nevada Crane about a skin clinic appointment.  Let me know if you can't get scheduled.   Restart lasix twice a day and I'll update cardiology.  Recheck in about 3-4 months with A1c at the visit.  Take care.  Glad to see you.

## 2022-11-06 ENCOUNTER — Telehealth: Payer: Self-pay | Admitting: Family Medicine

## 2022-11-06 DIAGNOSIS — L989 Disorder of the skin and subcutaneous tissue, unspecified: Secondary | ICD-10-CM | POA: Insufficient documentation

## 2022-11-06 NOTE — Assessment & Plan Note (Signed)
Continue insulin and Jardiance.  A1c 8, improved from prior.  I do not want to induce hypoglycemia so an A1c of 8 is likely reasonable.  See after visit summary.

## 2022-11-06 NOTE — Assessment & Plan Note (Signed)
  Both children designated if patient were incapacitated.

## 2022-11-06 NOTE — Assessment & Plan Note (Signed)
Continue Jardiance hydralazine and discussed taking Lasix twice a day to see if that helps with his shortness of breath.  I am going to update cardiology.

## 2022-11-06 NOTE — Assessment & Plan Note (Signed)
I asked him to call dermatology/Dr. [place] [facility] to see about getting a follow-up appointment.  See exam above.

## 2022-11-06 NOTE — Assessment & Plan Note (Signed)
Per outside clinic.  Discussed with patient.

## 2022-11-06 NOTE — Assessment & Plan Note (Signed)
Continue allopurinol half tablet Monday Wednesday Friday and discussed trigger avoidance.  He will update me as needed.

## 2022-11-06 NOTE — Telephone Encounter (Signed)
This patient had missed some of his afternoon doses of Lasix recently.  He had noted that he had been episodically short of breath.  I talked with him about restarting Lasix twice daily and I would appreciate your clinic arranging follow-up to reevaluate him.  Thank you.

## 2022-11-06 NOTE — Assessment & Plan Note (Signed)
Continue Crestor.  Labs discussed with patient.

## 2022-11-09 NOTE — Telephone Encounter (Signed)
[name], MD  You; Tonia Ghent, MD    We'll get him back in to see [place] Phillip Heal. Rexanne Mano. Can you arrange for [place] to see an APP in the next week or 2 please?  JJB   Attempted to call pt, unable to leave voicemail.

## 2022-11-10 NOTE — Telephone Encounter (Signed)
Appreciate cardiology input.

## 2022-11-10 NOTE — Telephone Encounter (Signed)
Spoke with patient and advised to call cardiology to schedule his appt with Dr. Gwenlyn Found. Patient states he will take care of this and call.

## 2022-11-10 NOTE — Telephone Encounter (Signed)
See below.  Please call patient and ask him to call cardiology.  Thanks.

## 2022-11-22 ENCOUNTER — Encounter: Payer: Self-pay | Admitting: Cardiology

## 2022-11-22 ENCOUNTER — Ambulatory Visit: Payer: Medicare Other | Attending: Physician Assistant | Admitting: Cardiology

## 2022-11-22 VITALS — BP 166/74 | HR 67 | Ht 67.0 in | Wt 172.4 lb

## 2022-11-22 DIAGNOSIS — I4821 Permanent atrial fibrillation: Secondary | ICD-10-CM | POA: Diagnosis not present

## 2022-11-22 DIAGNOSIS — I10 Essential (primary) hypertension: Secondary | ICD-10-CM

## 2022-11-22 DIAGNOSIS — I25709 Atherosclerosis of coronary artery bypass graft(s), unspecified, with unspecified angina pectoris: Secondary | ICD-10-CM | POA: Diagnosis not present

## 2022-11-22 DIAGNOSIS — I5032 Chronic diastolic (congestive) heart failure: Secondary | ICD-10-CM | POA: Diagnosis not present

## 2022-11-22 DIAGNOSIS — I480 Paroxysmal atrial fibrillation: Secondary | ICD-10-CM | POA: Diagnosis not present

## 2022-11-22 DIAGNOSIS — N184 Chronic kidney disease, stage 4 (severe): Secondary | ICD-10-CM

## 2022-11-22 MED ORDER — ISOSORBIDE MONONITRATE ER 30 MG PO TB24: 30.0000 mg | ORAL_TABLET | Freq: Every day | ORAL | 3 refills | Status: DC

## 2022-11-22 NOTE — Patient Instructions (Addendum)
Medication Instructions:  Your physician has recommended you make the following change in your medication:   START Isosorbide 30 mg taking 1 daily  *If you need a refill on your cardiac medications before your next appointment, please call your pharmacy*   Lab Work: TODAY:  BMET [facility] PRO BNP  If you have labs (blood work) drawn today and your tests are completely normal, you will receive your results only by: [place] (if you have MyChart) OR A paper copy in the mail If you have any lab test that is abnormal or we need to change your treatment, we will call you to review the results.   Testing/Procedures: Your physician has requested that you have an echocardiogram by 1/17 if possible. Echocardiography is a painless test that uses sound waves to create images of your heart. It provides your doctor with information about the size and shape of your heart and how well your heart's chambers and valves are working. This procedure takes approximately one hour. There are no restrictions for this procedure. Please do NOT wear cologne, perfume, aftershave, or lotions (deodorant is allowed). Please arrive 15 minutes prior to your appointment time.    Follow-Up: At [facility], you and your health needs are our priority.  As part of our continuing mission to provide you with exceptional heart care, we have created designated Provider Care Teams.  These Care Teams include your primary Cardiologist (physician) and Advanced Practice Providers (APPs -  Physician Assistants and Nurse Practitioners) who all work together to provide you with the care you need, when you need it.  We recommend signing up for the patient portal called "MyChart".  Sign up information is provided on this After Visit Summary.  MyChart is used to connect with patients for Virtual Visits (Telemedicine).  Patients are able to view lab/test results, encounter notes, upcoming appointments, etc.  Non-urgent messages can be  sent to your provider as well.   To learn more about what you can do with MyChart, go to [email or web address].    Your next appointment:   1 week(s)  The format for your next appointment:   In Person  Provider:   Quay Burow, MD     Other Instructions   Important Information About Sugar

## 2022-11-22 NOTE — Progress Notes (Signed)
Cardiology Office Note:    Date:  11/22/2022   ID:  [name], [name] 1932-04-19, MRN [id number]  PCP:  Tonia Ghent, MD   [facility] Prov[facility] Cardiologist:  Quay Burow, MD     Referring MD: Tonia Ghent, MD   No chief complaint on file.   History of Present Illness:    [name] is a 89 y.o. male with a hx of CAD, s/p CABG (1996 and 2006), PAF (Eliquis reduced dose), right carotid endarterectomy (1994), edge-to-edge A2/P2 mitral clipping by Dr. Burt Knack (2021), HTN, HLD, DM.   Last evaluated in our office by Dr. Gwenlyn Found on 01/11/22, at that time he was doing relatively well from a cardiac perspective, occasional chest pain but not bad enough to take NTG.   Evaluated by his PCP on 11/03/22, he had missed some dosages of his lasix and was reporting worsening SOB with exertion. His lasix was increased to 80 mg twice day and he was advised to follow up with cardiology.  He presents today with concerns of worsening episodes of chest pain and fatigue. Chest pain frequency has been worsening in frequency and duration. He has up to three episodes/day of chest pressure, he points to his left mid axillary chest, that radiates to his back and sometimes down his left arm. The pain is different than his previous angina, which was more of a burning sensation substernal. Often, the pain is relieved with rest after 3-5 minutes. He has taken NTG ~ 5 times over the last month and noticed that it does not change the duration or intensity of his chest pain. He frequently forgets his second dose of lasix daily, however when he does remember to take it twice daily, he still has chest pain.   He states over the last 2-3 months he has noticed his stamina slowly decline. He was previously able to ambulate ~ 1200 feet on a track around his home, and now he is barely able to walk ~ 80 feet inside his home. He denies palpitations, dyspnea, pnd, orthopnea, n, v, dizziness, syncope, weight gain,  or early satiety. He does have pedal edema L>R, which is at baseline for him. He weighs himself daily, weights are consistently 160-163. Checks his BP twice daily with a wrist cuff and questions the validity of the readings, "today 127/68 and then 180/60". Denies hematochezia, hematuria or hemoptysis.   Past Medical History:  Diagnosis Date   Age-related macular degeneration, wet, both eyes ([place])    Anemia    Secondary to acute blood loss   Anginal pain ([name])    last chest pain in Feb 2021   Arthritis    "mild in hands, knees, ankles" (11/13/2015)   Atrial fibrillation ([place])    Consideration was given for atrial flutter ablation, but patient developed atrial fibrillation. Cardioversion was done. Dr. Caryl Comes decided to watch him clinically. November, 2011   Atrial flutter [facility]) 07/2010   September, 2011   Hospital with PNA and cath done.Marland KitchenMarland KitchenCoumadin.  Atrial flutter ablation planned, but  pt. then had atrial fibrillation,/outpatient conversion 09/08/10..NSR..plan to follow..Dr. Caryl Comes   CAD (coronary artery disease)    Catheterization, September, 2011,  grafts patent from redo CABG,, medical therapy of coronary disease, consideration to proceeding with atrial flutter ablation   Carotid artery disease ([place])    Doppler 09/18/2009 - 49% bilateral stenoses   Carotid artery disease ([place])    49% bilateral, Doppler, November, 2010   CHF (congestive heart failure) ([place])  Chronic kidney disease (CKD), stage III (moderate) ([name])    Diabetic peripheral neuropathy ([name])    feet   Diverticulosis of colon with hemorrhage 2009   several unit diverticular bleed    Erectile dysfunction    Mild   Gout    Hearing loss    wears hearing aids   History of blood transfusion "several"   related to diverticular bleeding   Hyperlipidemia    Hypertension    Mitral regurgitation    Mild, echo, September, 2011   NSTEMI (non-ST elevated myocardial infarction) ([place]) 07/2010   at [facility] with repeat cath, rec medical  mgmt    OSA on CPAP    uses CPAP nightly   Personal history of colonic polyps    PNA (pneumonia) 9/11   NSTEMI at [facility] with repeat cath, rec medical mgmt    RBBB (right bundle branch block)    S/P mitral valve repair 02/06/2020   s/p TEER with a single MitraClip NTW placed on A2/P2   Shoulder pain    "positional; better now" (11/13/2015)   Skin cancer    R lower leg, per derm 2012   Type II diabetes mellitus ([place])     Past Surgical History:  Procedure Laterality Date   CARDIAC CATHETERIZATION  2006   Nuclear..slight lateral ischemia..medical therapy   CARDIAC CATHETERIZATION  08/04/2010   grafts patent from redo CABG...medical Rx and ablate Atrial flutter (LV not injected)    CARDIOVERSION  ~ 2010   CAROTID ENDARTERECTOMY Right 1994   CATARACT EXTRACTION W/ INTRAOCULAR LENS  IMPLANT, BILATERAL Bilateral    COLON RESECTION N/A 01/13/2016   Procedure: EXPLORATORY LAPAROTOMY, LEFT AND SIGMOID COLON REMOVAL;  Surgeon: Ralene Ok, MD;  Location: [place];  Service: General;  Laterality: N/A;  Extended open left hemicolectomy and sigmoidectomy    COLONOSCOPY N/A 11/14/2015   Procedure: COLONOSCOPY;  Surgeon: Manus Gunning, MD;  Location: [place];  Service: Gastroenterology;  Laterality: N/A;   COLONOSCOPY Left 01/05/2016   Procedure: COLONOSCOPY;  Surgeon: Manus Gunning, MD;  Location: [place];  Service: Gastroenterology;  Laterality: Left;  no sedation to start, moderate if needed   COLONOSCOPY W/ POLYPECTOMY     CORONARY ARTERY BYPASS GRAFT  1995; 2006   "X 3; X3"   CORONARY ARTERY BYPASS GRAFT  1995, 2006   DOPPLER ECHOCARDIOGRAPHY  08/2008   EF 60%   DOPPLER ECHOCARDIOGRAPHY  08/02/2010   65-70%   DOPPLER ECHOCARDIOGRAPHY  07/2010   MR mild   ESOPHAGOGASTRODUODENOSCOPY N/A 06/19/2014   Procedure: ESOPHAGOGASTRODUODENOSCOPY (EGD);  Surgeon: Jerene Bears, MD;  Location[facility] ENDOSCOPY;  Service: Endoscopy;  Laterality: N/A;   ESOPHAGOGASTRODUODENOSCOPY N/A  11/14/2015   Procedure: ESOPHAGOGASTRODUODENOSCOPY (EGD);  Surgeon: Manus Gunning, MD;  Location: [place];  Service: Gastroenterology;  Laterality: N/A;   FLEXIBLE SIGMOIDOSCOPY N/A 11/16/2015   Procedure: FLEXIBLE SIGMOIDOSCOPY;  Surgeon: Manus Gunning, MD;  Location: [facility] ENDOSCOPY;  Service: Gastroenterology;  Laterality: N/A;   LAPAROSCOPIC CHOLECYSTECTOMY  2008   LEFT HEART CATH N/A 06/14/2014   Procedure: LEFT HEART CATH;  Surgeon: Sinclair Grooms, MD;  Location: [facility] CATH LAB;  Service: Cardiovascular;  Laterality: N/A;   LEFT HEART CATH AND CORONARY ANGIOGRAPHY N/A 08/02/2018   Procedure: LEFT HEART CATH AND CORONARY ANGIOGRAPHY;  Surgeon: [name], MD;  Location: [place] CV LAB;  Service: Cardiovascular;  Laterality: N/A;   LEFT HEART CATHETERIZATION WITH CORONARY/GRAFT ANGIOGRAM N/A 06/11/2014   Procedure: LEFT HEART CATHETERIZATION WITH CORONARY/GRAFT  Cyril Loosen;  Surgeon: Sinclair Grooms, MD;  Location: [facility] CATH LAB;  Service: Cardiovascular;  Laterality: N/A;   MITRAL VALVE REPAIR N/A 02/06/2020   Procedure: MITRAL VALVE REPAIR;  Surgeon: Sherren Mocha, MD;  Location: [place] CV LAB;  Service: Cardiovascular;  Laterality: N/A;   PERCUTANEOUS CORONARY STENT INTERVENTION (PCI-S) N/A 06/13/2014   Procedure: PERCUTANEOUS CORONARY STENT INTERVENTION (PCI-S);  Surgeon: Sinclair Grooms, MD;  Location: [facility] CATH LAB;  Service: Cardiovascular;  Laterality: N/A;   RIGHT HEART CATH N/A 01/06/2020   Procedure: RIGHT HEART CATH;  Surgeon: Jolaine Artist, MD;  Location: [place] CV LAB;  Service: Cardiovascular;  Laterality: N/A;   SKIN CANCER EXCISION Left 10/2015   calf   SKIN CANCER EXCISION Right 2014?   chest   TEE WITHOUT CARDIOVERSION N/A 01/06/2020   Procedure: TRANSESOPHAGEAL ECHOCARDIOGRAM (TEE);  Surgeon: Jolaine Artist, MD;  Location: [facility] ENDOSCOPY;  Service: Cardiovascular;  Laterality: N/A;   TONSILLECTOMY     VASECTOMY      Current  Medications: Current Meds  Medication Sig   allopurinol (ZYLOPRIM) 100 MG tablet TAKE ONE-HALF (1/2) TABLET EVERY MONDAY, WEDNESDAY AND FRIDAY   amoxicillin (AMOXIL) 500 MG tablet Take by mouth. Patient takes 4 tablets prior to going to the dentist   aspirin 81 MG tablet Take 81 mg by mouth daily.   BD INSULIN SYRINGE ULTRAFINE 31G X 5/16" 0.3 ML MISC USE DAILY AS INSTRUCTED   Cholecalciferol (VITAMIN D) 1000 UNITS capsule Take 1,000 Units by mouth daily.    diclofenac Sodium (VOLTAREN) 1 % GEL Apply 2 g topically 4 (four) times daily.   ELIQUIS 2.5 MG TABS tablet TAKE 1 TABLET TWICE A DAY   empagliflozin (JARDIANCE) 10 MG TABS tablet Take 1 tablet (10 mg total) by mouth daily before breakfast. PLEASE CALL THE OFFICE TO ARRANGE YOUR FOLLOW UP APPOINTMENT   fish oil-omega-3 fatty acids 1000 MG capsule Take 1 g by mouth daily.   furosemide (LASIX) 80 MG tablet TAKE 1 TABLET TWICE A DAY (DOSAGE CHANGE)   glucose blood (FREESTYLE LITE) test strip USE TO TEST BLOOD SUGAR ONCE DAILY AND AS NEEDED. Dx E11.9   hydrALAZINE (APRESOLINE) 25 MG tablet TAKE 1 TABLET IN THE MORNING AND AT BEDTIME   ipratropium (ATROVENT) 0.03 % nasal spray USE 2 SPRAYS IN EACH NOSTRIL TWICE A DAY AS NEEDED FOR RHINITIS   isosorbide mononitrate (IMDUR) 30 MG 24 hr tablet Take 1 tablet (30 mg total) by mouth daily.   LANTUS 100 UNIT/ML injection INJECT 20 TO 25 UNITS AT BEDTIME   magnesium oxide (MAG-OX) 400 MG tablet Take 400 mg by mouth daily.   metoprolol tartrate (LOPRESSOR) 50 MG tablet TAKE 1 TABLET(50 MG) BY MOUTH TWICE DAILY   Multiple Vitamin (MULTIVITAMIN WITH MINERALS) TABS Take 1 tablet by mouth daily.   nitroGLYCERIN (NITROSTAT) 0.4 MG SL tablet DISSOLVE 1 TABLET UNDER THE TONGUE EVERY 5 MINUTES AS NEEDED FOR CHEST PAIN FOR A MAXIMUM OF 3 DOSES   Potassium Gluconate 595 MG CAPS Take 595 mg by mouth daily.   ranolazine (RANEXA) 500 MG 12 hr tablet TAKE 1 TABLET TWICE A DAY   rosuvastatin (CRESTOR) 40 MG tablet  TAKE 1 TABLET AT BEDTIME   traMADol (ULTRAM) 50 MG tablet TAKE 1 TABLET EVERY 6 HOURS AS NEEDED   valACYclovir (VALTREX) 1000 MG tablet Take by mouth.     Allergies:   Patient has no known allergies.   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     Lives with girlfriend [name].  Widowed 12/2005.   Social Determinants of Health   Financial Resource Strain: Low Risk  (11/01/2022)   Overall Financial Resource Strain (CARDIA)    Difficulty of Paying Living Expenses: Not hard at all  Food Insecurity: No Food Insecurity (11/01/2022)   Hunger Vital Sign    Worried About Running Out of Food in the Last Year: Never true    Ran Out of Food in the Last Year: Never true  Transportation Needs: No Transportation Needs (11/01/2022)   PRAPARE - Hydrologist (Medical): No    Lack of Transportation (Non-Medical): No  Physical Activity: Inactive (11/01/2022)   Exercise Vital Sign    Days of Exercise per Week: 0 days    Minutes of Exercise per Session: 0 min  Stress: No Stress Concern Present (11/01/2022)   [place]    Feeling of Stress : Not at all  Social Connections: Moderately Isolated (11/01/2022)   Social Connection and Isolation Panel [NHANES]    Frequency of Communication with Friends and Family: More than three times a week    Frequency of Social Gatherings with Friends and Family: More than three times a week    Attends Religious Services: Never    Marine scientist or Organizations: Yes    Attends Archivist Meetings: 1 to 4 times per year    Marital Status: Widowed     Family History: The patient's family history includes Arthritis in his sister; Cancer in his sister; Diabetes in his mother and sister; Heart disease in his father and sister; Kidney disease in his mother; Stroke in his mother. There is no history of Prostate cancer or Colon cancer.  ROS:   Review of Systems  Constitutional:  Positive for malaise/fatigue. Negative for weight loss.  HENT: Negative.    Eyes:  Negative for blurred vision and double vision.  Respiratory:  Negative for cough, hemoptysis, sputum production, shortness of breath and wheezing.   Cardiovascular:  Positive for chest pain and leg swelling. Negative for palpitations, orthopnea, claudication and PND.  Gastrointestinal: Negative.   Genitourinary:  Negative for hematuria.  Skin: Negative.   Neurological:  Negative for dizziness and loss of consciousness.  Endo/Heme/Allergies:  Bruises/bleeds easily.  Psychiatric/Behavioral: Negative.       EKGs/Labs/Other Studies Reviewed:    The following studies were reviewed today:  01/17/22 Echo complete - Hypokinesis of the inferior wall, EF 50%, mild LVH. RV mildly enlarged  08/02/18 left heart cath - Occluded RCA vein graft with left-to-right collaterals which is a new finding since his last cath 4 years ago. Proximal circumflex and obtuse marginal branch have progressed but unfortunately was not able to cross the obtuse marginal branch ostium. This came out from  the AV groove had a right angle and was high-grade and calcified. At this point, I recommend aggressive antianginal medical therapy.   Ost RCA to Prox RCA lesion is 100% stenosed. Ost LAD to Prox LAD lesion is 100% stenosed. Ost Cx lesion is 70% stenosed. Ost 1st Mrg lesion is 90% stenosed. Prox Cx lesion is 60% stenosed. Origin to Prox Graft lesion is 100% stenosed. Previously placed Origin to Mid Graft stent (unknown type) is widely patent.    EKG:  EKG is  ordered today.  The ekg ordered today demonstrates atrial fibrillation, RBBB, LA fascicular block, HR 67 bpm, consistent with previous EKG tracing.   Recent Labs: 10/27/2022: ALT 13; BUN 42; Creatinine, Ser 2.87; Hemoglobin 12.3; Platelets 163.0; Potassium 4.1; Sodium 140  Recent Lipid Panel    Component Value Date/Time   CHOL 115 10/27/2022 0854   TRIG 180.0 (H) 10/27/2022 0854   HDL 34.40 (L) 10/27/2022 0854   CHOLHDL 3 10/27/2022 0854   VLDL 36.0 10/27/2022 0854   LDLCALC 45 10/27/2022 0854     Risk Assessment/Calculations:    CHA2DS2-VASc Score = 6   This indicates a 9.7% annual risk of stroke. The patient's score is based upon: CHF History: 1 HTN History: 1 Diabetes History: 1 Stroke History: 0 Vascular Disease History: 1 Age Score: 2 Gender Score: 0     HYPERTENSION CONTROL Vitals:   11/22/22 1541 11/22/22 1710  BP: (!) 160/62 (!) 166/74    The patient's blood pressure is elevated above target today.  In order to address the patient's elevated BP: Blood pressure will be monitored at home to determine if medication changes need to be made.; A new medication was prescribed today.            Physical Exam:    VS:  BP (!) 166/74   Pulse 67   Ht '5\' 7"'$  (1.702 m)   Wt 172 lb 6.4 oz (78.2 kg)   SpO2 96%   BMI 27.00 kg/m     Wt Readings from Last 3 Encounters:  11/22/22 172 lb 6.4 oz (78.2 kg)  11/03/22 171 lb (77.6 kg)  11/01/22 168 lb (76.2 kg)     GEN:  Well nourished, well developed in no  acute distress HEENT: Normal NECK: No JVD; No carotid bruits LYMPHATICS: No lymphadenopathy CARDIAC: irregular rhythm, +murmur left sternal border, no rubs, no gallops RESPIRATORY:  Clear to auscultation without rales, wheezing or rhonchi  ABDOMEN: Soft, non-tender, non-distended MUSCULOSKELETAL:  +2 pitting edema L, no edema in right--baseline per pt since saphenous vein harvest; No deformity  SKIN: Warm and dry NEUROLOGIC:  Alert and oriented x 3 PSYCHIATRIC:  Normal affect   ASSESSMENT:    1. Coronary artery disease involving coronary bypass graft of native heart with angina pectoris ([place])   2. Chronic diastolic heart failure ([place])   3. Essential hypertension   4. Permanent atrial fibrillation ([place])   5. CKD (chronic kidney disease), stage IV ([name])    PLAN:    In order of problems listed above:  CAD with angina- EKG today unremarkable. Recent cath in 2019 showed: occluded  RCA vein graft with left-to-right collaterals which is a new finding since his last cath 4 years ago. Proximal circumflex and obtuse marginal branch have progressed but unfortunately was not able to cross the obtuse marginal branch ostium. Aggressive antianginal therapy was recommended. Chest pain appears to be anginal in nature, although it is different than his previous angina, which was more of a burning senstaion. Has used NTG several times without any notable changes. He is at high risk of contrast induced nepropathy, so would like to avoid a cath if possible. Continue ASA, metoprolol, Ranexa, NTG as needed. Start Imdur 30 mg daily. Will check BNP, BMET, repeat echocardiogram. I discussed this with Dr. Gasper Sells, who is the attending in the office today, who agreed with above treatment plan.  Chronic diastolic HF - Euvolemic today, NYHA class III. Frequently forgets to take his second lasix dose of the day. Continue metoprolol, lasix, Jardiance, continue to weigh daily, strongly encouraged him to remember to  take his second dose of lasix. If BNP is significantly elevated, will consider changing to torsemide, if it is relatively normal will continue with current diuretic plan.  HTN - BP today, 160/62, rechecked 166/74. Continue hydralazine, metoprolol. Adding Imdur 30 mg daily, this should help reduce his BP.  PAF - AF noted on EKG, HR 67. Continue Eliquis (reduced dose indicated, age, creatinine 2.87).  CKD stage IV - Follow with Dr. Posey Pronto.      Disposition - Start Imdur 30 mg daily. Check BNP, BMET. Echo complete. Follow up with Dr. Gwenlyn Found.        Medication Adjustments/Labs and Tests Ordered: Current medicines are reviewed at length with the patient today.  Concerns regarding medicines are outlined above.  Orders Placed This Encounter  Procedures   Basic metabolic panel   Pro b natriuretic peptide (BNP)   EKG 12-Lead   ECHOCARDIOGRAM COMPLETE   Meds ordered this encounter  Medications   isosorbide mononitrate (IMDUR) 30 MG 24 hr tablet    Sig: Take 1 tablet (30 mg total) by mouth daily.    Dispense:  90 tablet    Refill:  3    Patient Instructions  Medication Instructions:  Your physician has recommended you make the following change in your medication:   START Isosorbide 30 mg taking 1 daily  *If you need a refill on your cardiac medications before your next appointment, please call your pharmacy*   Lab Work: TODAY:  BMET & PRO BNP  If you have labs (blood work) drawn today and your tests are completely normal, you will receive your results only by: [place] (if you have MyChart) OR A paper copy in the mail If you have any lab test that is abnormal or we need to change your treatment, we will call you to review the results.   Testing/Procedures: Your physician has requested that you have an echocardiogram by 1/17 if possible. Echocardiography is a painless test that uses sound waves to create images of your heart. It provides your doctor with information about the  size and shape of your heart and how well your heart's chambers and valves are working. This procedure takes approximately one hour. There are no restrictions for this procedure. Please do NOT wear cologne, perfume, aftershave, or lotions (deodorant is allowed). Please arrive 15 minutes prior to your appointment time.    Follow-Up: At [facility], you and your health needs are our priority.  As part of our continuing mission to provide you with exceptional heart  care, we have created designated Provider Care Teams.  These Care Teams include your primary Cardiologist (physician) and Advanced Practice Providers (APPs -  Physician Assistants and Nurse Practitioners) who all work together to provide you with the care you need, when you need it.  We recommend signing up for the patient portal called "MyChart".  Sign up information is provided on this After Visit Summary.  MyChart is used to connect with patients for Virtual Visits (Telemedicine).  Patients are able to view lab/test results, encounter notes, upcoming appointments, etc.  Non-urgent messages can be sent to your provider as well.   To learn more about what you can do with MyChart, go to [email or web address].    Your next appointment:   1 week(s)  The format for your next appointment:   In Person  Provider:   Quay Burow, MD     Other Instructions   Important Information About Sugar         Signed, Trudi Ida, NP  11/22/2022 5:40 PM    [place]

## 2022-11-23 ENCOUNTER — Ambulatory Visit: Payer: Self-pay

## 2022-11-23 ENCOUNTER — Telehealth: Payer: Self-pay

## 2022-11-23 DIAGNOSIS — I5032 Chronic diastolic (congestive) heart failure: Secondary | ICD-10-CM

## 2022-11-23 LAB — BASIC METABOLIC PANEL WITH GFR
BUN/Creatinine Ratio: 15 (ref 10–24)
BUN: 40 mg/dL — ABNORMAL HIGH (ref 8–27)
CO2: 27 mmol/L (ref 20–29)
Calcium: 9.8 mg/dL (ref 8.6–10.2)
Chloride: 99 mmol/L (ref 96–106)
Creatinine, Ser: 2.68 mg/dL — ABNORMAL HIGH (ref 0.76–1.27)
Glucose: 283 mg/dL — ABNORMAL HIGH (ref 70–99)
Potassium: 4.8 mmol/L (ref 3.5–5.2)
Sodium: 140 mmol/L (ref 134–144)
eGFR: 22 mL/min/1.73 — ABNORMAL LOW

## 2022-11-23 LAB — PRO B NATRIURETIC PEPTIDE: NT-Pro BNP: 6878 pg/mL — ABNORMAL HIGH (ref 0–486)

## 2022-11-23 MED ORDER — METOLAZONE 2.5 MG PO TABS: 2.5000 mg | ORAL_TABLET | Freq: Once | ORAL | 0 refills | Status: DC

## 2022-11-23 NOTE — Telephone Encounter (Signed)
Spoke with patient to review recommendations by Venia Carbon, NP. Patient will take onetime dose of Metolazone 2.5 mg tomorrow morning 30 minutes before taking Lasix. Patient will have BNP and BMET on 11/28/22. Follow up with Dr Alvester Chou 11/30/22 as scheduled.  Patient verbalized understanding and had no questions.

## 2022-11-24 ENCOUNTER — Ambulatory Visit: Payer: Medicare Other | Attending: Cardiology

## 2022-11-24 DIAGNOSIS — I25709 Atherosclerosis of coronary artery bypass graft(s), unspecified, with unspecified angina pectoris: Secondary | ICD-10-CM | POA: Diagnosis present

## 2022-11-24 LAB — ECHOCARDIOGRAM COMPLETE
AR max vel: 0.9 cm2
AV Area VTI: 0.87 cm2
AV Area mean vel: 0.89 cm2
AV Mean grad: 8.2 mmHg
AV Peak grad: 15.1 mmHg
Ao pk vel: 1.94 m/s
MV VTI: 0.86 cm2
S' Lateral: 3.4 cm

## 2022-11-24 MED ORDER — PERFLUTREN LIPID MICROSPHERE
1.0000 mL | INTRAVENOUS | Status: AC | PRN
  Administered 2022-11-24: 2 mL via INTRAVENOUS

## 2022-11-24 NOTE — Patient Outreach (Signed)
  Care Coordination   Follow Up Visit Note   11/24/2022 Late entry for 11/25/22 Name: [name] MRN: [id number] DOB: 09-24-1932  [name] is a 89 y.o. year old male who sees Tonia Ghent, MD for primary care. I spoke with  [name] by phone today.  What matters to the patients health and wellness today?  "Elevated blood work due to heart failure."    Goals Addressed             This Visit's Progress    Development of plan of care for CAD/ Heart failure       Care Coordination Interventions: Evaluation of current treatment plan related to CAD/ Heart failure and patient's adherence to plan as established by provider:  Patient states he received a call today from his doctors office informing him his recent blood work results showed elevation for heart failure.  Patient states he is scheduled to have an echocardiogram  and to have repeat labs on 11/28/22. Patient states he has been having symptoms of chest pain and fatigue.  He states his doctors are aware of this. Patient reports seeing his cardiologist on 11/22/22 and primary care provider on 11/03/22. Reviewed medications with patient and discussed importance of medication adherence:  Patient reports medication Imdur was added to his treatment plan and another medication was added today that he needs to pick up from the drug store.  He states he is unsure of the name for this medication.  Discussed with patient per cardiology note  and advisement that he will take a one time dose of Metolazone.  Patient states he will pick the prescription for Metolazone up from pharmacy today.  Patient advised to continue to weight daily and record and adhere to a low salt diet. :Advised to notify provider for a weight gain of 3 lbs overnight or 5 lbs in a weight or worsening symptoms.  Call 911 for severe symptoms.    Patient reports his current weight today is 164 lbs.  Reviewed scheduled/upcoming provider appointments Discussed plans with  patient for ongoing care management follow up:  Patient verbally agreed to next telephone call with [facility] for 12/12/22            SDOH assessments and interventions completed:  No     Care Coordination Interventions:  Yes, provided   Follow up plan: Follow up call scheduled for 12/12/22    Encounter Outcome:  Pt. Visit Completed   [name] RN,[name],CCM [place] [phone] direct line

## 2022-11-25 MED ORDER — METOLAZONE 2.5 MG PO TABS: 2.5000 mg | ORAL_TABLET | Freq: Every day | ORAL | 0 refills | Status: DC

## 2022-11-25 MED ORDER — METOLAZONE 2.5 MG PO TABS: 2.5000 mg | ORAL_TABLET | Freq: Once | ORAL | 0 refills | Status: DC

## 2022-11-25 NOTE — Addendum Note (Signed)
Addended by: Carter Kitten D on: 11/25/2022 10:43 AM   Modules accepted: Orders

## 2022-11-25 NOTE — Addendum Note (Signed)
Addended by: Carter Kitten D on: 11/25/2022 10:39 AM   Modules accepted: Orders

## 2022-11-28 ENCOUNTER — Ambulatory Visit: Payer: Medicare Other | Attending: Cardiovascular Disease

## 2022-11-28 DIAGNOSIS — I5032 Chronic diastolic (congestive) heart failure: Secondary | ICD-10-CM

## 2022-11-29 LAB — BASIC METABOLIC PANEL WITH GFR
BUN/Creatinine Ratio: 13 (ref 10–24)
BUN: 50 mg/dL — ABNORMAL HIGH (ref 8–27)
CO2: 30 mmol/L — ABNORMAL HIGH (ref 20–29)
Calcium: 10.6 mg/dL — ABNORMAL HIGH (ref 8.6–10.2)
Chloride: 90 mmol/L — ABNORMAL LOW (ref 96–106)
Creatinine, Ser: 3.72 mg/dL — ABNORMAL HIGH (ref 0.76–1.27)
Glucose: 312 mg/dL — ABNORMAL HIGH (ref 70–99)
Potassium: 4.3 mmol/L (ref 3.5–5.2)
Sodium: 138 mmol/L (ref 134–144)
eGFR: 15 mL/min/1.73 — ABNORMAL LOW

## 2022-11-29 LAB — PRO B NATRIURETIC PEPTIDE: NT-Pro BNP: 6654 pg/mL — ABNORMAL HIGH (ref 0–486)

## 2022-11-30 ENCOUNTER — Ambulatory Visit: Payer: Medicare Other | Attending: Cardiovascular Disease | Admitting: Cardiovascular Disease

## 2022-11-30 ENCOUNTER — Encounter: Payer: Self-pay | Admitting: Cardiovascular Disease

## 2022-11-30 VITALS — BP 108/68 | HR 63 | Ht 67.0 in | Wt 159.4 lb

## 2022-11-30 DIAGNOSIS — E782 Mixed hyperlipidemia: Secondary | ICD-10-CM | POA: Diagnosis not present

## 2022-11-30 DIAGNOSIS — I10 Essential (primary) hypertension: Secondary | ICD-10-CM | POA: Insufficient documentation

## 2022-11-30 DIAGNOSIS — I480 Paroxysmal atrial fibrillation: Secondary | ICD-10-CM | POA: Diagnosis not present

## 2022-11-30 DIAGNOSIS — Z951 Presence of aortocoronary bypass graft: Secondary | ICD-10-CM | POA: Diagnosis not present

## 2022-11-30 DIAGNOSIS — I25709 Atherosclerosis of coronary artery bypass graft(s), unspecified, with unspecified angina pectoris: Secondary | ICD-10-CM | POA: Diagnosis not present

## 2022-11-30 DIAGNOSIS — Z9889 Other specified postprocedural states: Secondary | ICD-10-CM | POA: Diagnosis not present

## 2022-11-30 MED ORDER — HYDRALAZINE HCL 25 MG PO TABS: 25.0000 mg | ORAL_TABLET | Freq: Every day | ORAL | 3 refills | Status: DC

## 2022-11-30 NOTE — Assessment & Plan Note (Signed)
History of CAD status post CABG in 1995.  He had stenting to his circumflex OM graft August 2015.  His last catheterization performed by myself 08/02/2018 revealed an occluded native RCA and RCA graft with left-to-right collaterals, patent vein graft to a diagonal branch and a patent LIMA to the LAD.  Circumflex was ungrafted.  He did have a subtotally occluded calcified ostial first obtuse marginal branch stenosis which I was unable to cross he has been treated medically since.  He saw Richardson Dopp and Venia Carbon in the office 11/22/2022 complaining of fatigue and increased chest pain.  He has begun on Imdur and his symptoms are somewhat improved although he remains somewhat weak.  A 2D echocardiogram performed 11/24/2022 revealed a decline in his EF from 50 to 55% down to 35 to 40%.  He does have moderate pulm hypertension which is unchanged.  He is status post MitraClip with only minimal MR.  He has moderate aortic stenosis (probably low gradient), somewhat progressed since last year.  At this point, given his severe renal insufficiency with a creatinine in the 3.7 range we mutually decided not to pursue an invasive evaluation at this time rather conservative/medical therapy.

## 2022-11-30 NOTE — Patient Instructions (Signed)
Medication Instructions:  Your physician has recommended you make the following change in your medication:   -Take hydralazine (apresoline) '25mg'$  once daily.  *If you need a refill on your cardiac medications before your next appointment, please call your pharmacy*   Follow-Up: At [facility], you and your health needs are our priority.  As part of our continuing mission to provide you with exceptional heart care, we have created designated Provider Care Teams.  These Care Teams include your primary Cardiologist (physician) and Advanced Practice Providers (APPs -  Physician Assistants and Nurse Practitioners) who all work together to provide you with the care you need, when you need it.  We recommend signing up for the patient portal called "MyChart".  Sign up information is provided on this After Visit Summary.  MyChart is used to connect with patients for Virtual Visits (Telemedicine).  Patients are able to view lab/test results, encounter notes, upcoming appointments, etc.  Non-urgent messages can be sent to your provider as well.   To learn more about what you can do with MyChart, go to [email or web address].    Your next appointment:   3 month(s)  Provider:   Richardson Dopp, PA-C       Then, Quay Burow, MD will plan to see you again in 6 month(s).

## 2022-11-30 NOTE — Assessment & Plan Note (Signed)
History of PAF on Eliquis oral anticoagulation.

## 2022-11-30 NOTE — Assessment & Plan Note (Signed)
History of hyperlipidemia on high-dose statin therapy with lipid profile performed 10/27/2022 revealing total cholesterol 115, LDL 45 and HDL 34.

## 2022-11-30 NOTE — Assessment & Plan Note (Signed)
History of essential hypertension a blood pressure measured today at 108/68.  He is on hydralazine 25 mg p.o. twice daily and metoprolol.  I told him to drop one of his hydralazine doses because of his "relative hypotension".

## 2022-11-30 NOTE — Assessment & Plan Note (Signed)
History of mitral clipping by Dr. Burt Knack 02/06/2020 with recent echo that showed only mild MR.

## 2022-11-30 NOTE — Progress Notes (Signed)
11/30/2022 Bradly Bienenstock Wheeless   Mar 23, 1932  [id number]  Primary Physician Tonia Ghent, MD Primary Cardiologist: [name] MD [name], [place], [place]  HPI:  [name] is a 89 y.o.    engaged Caucasian male  formerly a patient of [name]. I last saw him in the office 01/11/2022. Mr. Wetmore is the father of one daughter and one son , the grandfather to 2 grandchildren.  He is accompanied by his son Latanya Maudlin.  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 His past medical history is remarkable for coronary artery bypass grafting in 1996 and again in 2006 by [name]. He had remote right carotid endarterectomy by [name] in 1994. History of hypertension, hyperlipidemia and diabetes. He was hospitalized in late July 2015 because of unstable angina. [name] performed cardiac catheterization revealing a high-grade obtuse marginal branch vein graft stenosis which was stented. Because of recurrent chest pain he was re-intervention relates later and found to have a new hazy lesion just proximal to the previously placed stent which was again intervened on. He was deemed a Plavix nonresponder and was begun on Brilenta. He also has a remote history of PAF as well as diverticulitis and has had GI bleed on Coumadin. His oral anticoagulation was discontinued.    He was recently admitted on 11/13/15 for a GI bleed. He underwent endoscopy revealing gastritis, colonoscopy revealing bleeding diverticula which was treated with clipping. He had 3 units of packed red blood cell transfusion. He did have chest pain and positive enzymes after his endoscopy within the echo that showed an EF of 50% with a new anterior wall motion and reality. He was thought to have "a non-STEMI.  His Plavix was discontinued. Because of his moderate renal insufficiency and his inability to take antiplatelet therapy he did not undergo repeat cardiac catheterization. He underwent  left-sided hemicolectomy and sigmoidectomy because of chronic GI bleeding. This was uneventful. His hemoglobin has remained stable in the 12 range last checked in April.   Because of his new onset chest pain and dyspnea performed a Myoview stress test that showed a new inferolateral scar/ischemic abnormality and echo that showed segmental wall motion abnormalities.  Because of this I performed diagnostic coronary arteriography on him 08/02/2018 revealing a newly occluded RCA vein graft with a high-grade first circumflex obtuse marginal branch ostial stenosis which I was unable to access because of geometry.  S   He saw Kerin Ransom back in the office 03/20/2019 complaining of increasing dyspnea.  Low-dose long-acting nitrates were added.  Hemoglobin was stable at 11.  He continues to complain of increasing dyspnea.  He is in A. fib with a controlled ventricular response on Eliquis.     He had a right heart cath by Dr. Haroldine Laws for severe MR which he was symptomatic from.  He ultimately underwent successful edge-to-edge A2/P2 mitral clipping by Dr. Burt Knack 02/06/2020 [facility] resulting in reduction of 4+ to 1+ MR. 11/24/2022 revealed a decline in his EF from 50 to 55% down to 35 to 40%.  He did have moderate pulm hypertension which is unchanged.  He had mild MR status post mitral clipping.  He had probably at least moderate aortic stenosis (low gradient) with an aortic valve area measured 0.87 cm.  Recently he has noticed increasing fatigue, dyspnea and chest pain.  He saw Venia Carbon and Richardson Dopp in the office approximately week ago who began him on  Imdur.  His chest pain has improved.  It has been 4 years since his last cath although now his serum creatinine is 3.7 making invasive evaluation problematic.  He wishes to continue with medical therapy.     Current Meds  Medication Sig   allopurinol (ZYLOPRIM) 100 MG tablet TAKE ONE-HALF (1/2) TABLET EVERY MONDAY, WEDNESDAY AND FRIDAY   amoxicillin  (AMOXIL) 500 MG tablet Take by mouth. Patient takes 4 tablets prior to going to the dentist   aspirin 81 MG tablet Take 81 mg by mouth daily.   BD INSULIN SYRINGE ULTRAFINE 31G X 5/16" 0.3 ML MISC USE DAILY AS INSTRUCTED   Cholecalciferol (VITAMIN D) 1000 UNITS capsule Take 1,000 Units by mouth daily.    diclofenac Sodium (VOLTAREN) 1 % GEL Apply 2 g topically 4 (four) times daily.   ELIQUIS 2.5 MG TABS tablet TAKE 1 TABLET TWICE A DAY   empagliflozin (JARDIANCE) 10 MG TABS tablet Take 1 tablet (10 mg total) by mouth daily before breakfast. PLEASE CALL THE OFFICE TO ARRANGE YOUR FOLLOW UP APPOINTMENT   fish oil-omega-3 fatty acids 1000 MG capsule Take 1 g by mouth daily.   furosemide (LASIX) 80 MG tablet TAKE 1 TABLET TWICE A DAY (DOSAGE CHANGE)   glucose blood (FREESTYLE LITE) test strip USE TO TEST BLOOD SUGAR ONCE DAILY AND AS NEEDED. Dx E11.9   ipratropium (ATROVENT) 0.03 % nasal spray USE 2 SPRAYS IN EACH NOSTRIL TWICE A DAY AS NEEDED FOR RHINITIS   isosorbide mononitrate (IMDUR) 30 MG 24 hr tablet Take 1 tablet (30 mg total) by mouth daily.   LANTUS 100 UNIT/ML injection INJECT 20 TO 25 UNITS AT BEDTIME   magnesium oxide (MAG-OX) 400 MG tablet Take 400 mg by mouth daily.   metolazone (ZAROXOLYN) 2.5 MG tablet Take 1 tablet (2.5 mg total) by mouth daily. Take 30 minutes prior to taking morning dose of Lasix   metoprolol tartrate (LOPRESSOR) 50 MG tablet TAKE 1 TABLET(50 MG) BY MOUTH TWICE DAILY   Multiple Vitamin (MULTIVITAMIN WITH MINERALS) TABS Take 1 tablet by mouth daily.   nitroGLYCERIN (NITROSTAT) 0.4 MG SL tablet DISSOLVE 1 TABLET UNDER THE TONGUE EVERY 5 MINUTES AS NEEDED FOR CHEST PAIN FOR A MAXIMUM OF 3 DOSES   Potassium Gluconate 595 MG CAPS Take 595 mg by mouth daily.   ranolazine (RANEXA) 500 MG 12 hr tablet TAKE 1 TABLET TWICE A DAY   rosuvastatin (CRESTOR) 40 MG tablet TAKE 1 TABLET AT BEDTIME   traMADol (ULTRAM) 50 MG tablet TAKE 1 TABLET EVERY 6 HOURS AS NEEDED    valACYclovir (VALTREX) 1000 MG tablet Take by mouth.   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     Lives with girlfriend [name].  Widowed 12/2005.   Social Determinants of Health   Financial Resource Strain: Low Risk  (11/01/2022)   Overall Financial Resource Strain (CARDIA)    Difficulty of Paying Living Expenses: Not hard at all  Food Insecurity: No Food Insecurity (11/01/2022)   Hunger Vital Sign    Worried About Running Out of Food in the Last Year: Never true    Ran Out of Food in the Last Year: Never true  Transportation Needs: No Transportation Needs (11/01/2022)   PRAPARE - Hydrologist (Medical): No    Lack of Transportation (Non-Medical): No  Physical Activity: Inactive (11/01/2022)   Exercise Vital Sign    Days of Exercise per Week: 0 days    Minutes of Exercise per Session: 0 min  Stress: No Stress Concern Present (11/01/2022)   [place]    Feeling of Stress : Not at all  Social Connections: Moderately Isolated (11/01/2022)   Social Connection and Isolation Panel [NHANES]    Frequency of Communication with Friends and Family: More than three times a week    Frequency of Social Gatherings with Friends and Family: More than three times a week    Attends Religious Services: Never    Marine scientist or Organizations: Yes    Attends Archivist Meetings: 1 to 4 times per year    Marital Status: Widowed  Intimate Partner Violence: Not At Risk (11/01/2022)   Humiliation, Afraid, Rape, and Kick questionnaire    Fear of Current or Ex-Partner: No    Emotionally Abused: No    Physically Abused: No    Sexually Abused: No     Review of Systems: General: negative for chills, fever, night sweats or weight changes.  Cardiovascular: negative for chest pain, dyspnea on exertion, edema, orthopnea, palpitations, paroxysmal nocturnal dyspnea or shortness of breath Dermatological: negative for rash Respiratory: negative for cough or wheezing Urologic: negative for hematuria Abdominal: negative for nausea, vomiting, diarrhea, bright red blood per rectum, melena, or hematemesis Neurologic: negative for visual changes, syncope, or dizziness All other systems reviewed and are otherwise negative except as noted above.    Blood pressure 108/68, pulse 63, height '5\' 7"'$  (1.702 m), weight 159 lb 6.4 oz (72.3 kg), SpO2 98 %.  General appearance: alert and no distress Neck: no adenopathy, no carotid bruit, no JVD, supple, symmetrical, trachea midline, and thyroid not enlarged, symmetric, no tenderness/mass/nodules Lungs: clear to auscultation bilaterally Heart: Soft outflow tract murmur consistent with aortic stenosis. Extremities: extremities normal, atraumatic, no cyanosis or edema  EKG not performed today  ASSESSMENT AND PLAN:   HLD (hyperlipidemia) History of hyperlipidemia on high-dose statin therapy with  lipid profile performed 10/27/2022 revealing total cholesterol 115, LDL 45 and HDL 34.  Hx of CABG History of CAD status post CABG in 1995.  He had stenting to his circumflex OM graft August 2015.  His last catheterization performed by myself 08/02/2018 revealed an occluded native RCA and RCA graft with left-to-right collaterals, patent vein graft to a diagonal branch and  a patent LIMA to the LAD.  Circumflex was ungrafted.  He did have a subtotally occluded calcified ostial first obtuse marginal branch stenosis which I was unable to cross he has been treated medically since.  He saw Richardson Dopp and Venia Carbon in the office 11/22/2022 complaining of fatigue and increased chest pain.  He has begun on Imdur and his symptoms are somewhat improved although he remains somewhat weak.  A 2D echocardiogram performed 11/24/2022 revealed a decline in his EF from 50 to 55% down to 35 to 40%.  He does have moderate pulm hypertension which is unchanged.  He is status post MitraClip with only minimal MR.  He has moderate aortic stenosis (probably low gradient), somewhat progressed since last year.  At this point, given his severe renal insufficiency with a creatinine in the 3.7 range we mutually decided not to pursue an invasive evaluation at this time rather conservative/medical therapy.  S/P mitral valve repair History of mitral clipping by Dr. Burt Knack 02/06/2020 with recent echo that showed only mild MR.  HTN (hypertension) History of essential hypertension a blood pressure measured today at 108/68.  He is on hydralazine 25 mg p.o. twice daily and metoprolol.  I told him to drop one of his hydralazine doses because of his "relative hypotension".  PAF (paroxysmal atrial fibrillation) ([name]) History of PAF on Eliquis oral anticoagulation.     [name] MD FACP,FACC,FAHA, [facility] 11/30/2022 12:15 PM

## 2022-12-02 ENCOUNTER — Other Ambulatory Visit: Payer: Self-pay

## 2022-12-02 MED ORDER — HYDRALAZINE HCL 25 MG PO TABS: 25.0000 mg | ORAL_TABLET | Freq: Every day | ORAL | 3 refills | Status: DC

## 2022-12-03 ENCOUNTER — Other Ambulatory Visit: Payer: Self-pay | Admitting: Family Medicine

## 2022-12-03 DIAGNOSIS — N184 Chronic kidney disease, stage 4 (severe): Secondary | ICD-10-CM

## 2022-12-05 ENCOUNTER — Telehealth: Payer: Self-pay

## 2022-12-05 NOTE — Progress Notes (Signed)
Care Management & Coordination Services Pharmacy Team  Reason for Encounter: Appointment Reminder  Contacted patient on 12/05/2022   Recent office visits:  11/03/22 [name], MD HTN Stop: Cetirizine HCI 10 mg F/U 3 - 4 months 11/01/22 AWV  Recent consult visits:  11/30/22 Quay Burow, MD (Cardiology) HLD Change: Hydralazine HCI 25 mg F/U 3 months 11/22/22 Venia Carbon, NP (Cardiology) Coronary artery disease Start: Isosorbide Mononitrate 30 mg  Hospital visits:  None in previous 6 months  Medications: Outpatient Encounter Medications as of 12/05/2022  Medication Sig   allopurinol (ZYLOPRIM) 100 MG tablet TAKE ONE-HALF (1/2) TABLET EVERY MONDAY, WEDNESDAY AND FRIDAY   amoxicillin (AMOXIL) 500 MG tablet Take by mouth. Patient takes 4 tablets prior to going to the dentist   aspirin 81 MG tablet Take 81 mg by mouth daily.   BD INSULIN SYRINGE ULTRAFINE 31G X 5/16" 0.3 ML MISC USE DAILY AS INSTRUCTED   Cholecalciferol (VITAMIN D) 1000 UNITS capsule Take 1,000 Units by mouth daily.    diclofenac Sodium (VOLTAREN) 1 % GEL Apply 2 g topically 4 (four) times daily.   ELIQUIS 2.5 MG TABS tablet TAKE 1 TABLET TWICE A DAY   empagliflozin (JARDIANCE) 10 MG TABS tablet Take 1 tablet (10 mg total) by mouth daily before breakfast. PLEASE CALL THE OFFICE TO ARRANGE YOUR FOLLOW UP APPOINTMENT   fish oil-omega-3 fatty acids 1000 MG capsule Take 1 g by mouth daily.   furosemide (LASIX) 80 MG tablet TAKE 1 TABLET TWICE A DAY (DOSAGE CHANGE)   glucose blood (FREESTYLE LITE) test strip USE TO TEST BLOOD SUGAR ONCE DAILY AND AS NEEDED. Dx E11.9   hydrALAZINE (APRESOLINE) 25 MG tablet Take 1 tablet (25 mg total) by mouth daily.   ipratropium (ATROVENT) 0.03 % nasal spray USE 2 SPRAYS IN EACH NOSTRIL TWICE A DAY AS NEEDED FOR RHINITIS   isosorbide mononitrate (IMDUR) 30 MG 24 hr tablet Take 1 tablet (30 mg total) by mouth daily.   LANTUS 100 UNIT/ML injection INJECT 20 TO 25 UNITS AT BEDTIME    magnesium oxide (MAG-OX) 400 MG tablet Take 400 mg by mouth daily.   metolazone (ZAROXOLYN) 2.5 MG tablet Take 1 tablet (2.5 mg total) by mouth daily. Take 30 minutes prior to taking morning dose of Lasix   metoprolol tartrate (LOPRESSOR) 50 MG tablet TAKE 1 TABLET(50 MG) BY MOUTH TWICE DAILY   Multiple Vitamin (MULTIVITAMIN WITH MINERALS) TABS Take 1 tablet by mouth daily.   nitroGLYCERIN (NITROSTAT) 0.4 MG SL tablet DISSOLVE 1 TABLET UNDER THE TONGUE EVERY 5 MINUTES AS NEEDED FOR CHEST PAIN FOR A MAXIMUM OF 3 DOSES   Potassium Gluconate 595 MG CAPS Take 595 mg by mouth daily.   ranolazine (RANEXA) 500 MG 12 hr tablet TAKE 1 TABLET TWICE A DAY   rosuvastatin (CRESTOR) 40 MG tablet TAKE 1 TABLET AT BEDTIME   traMADol (ULTRAM) 50 MG tablet TAKE 1 TABLET EVERY 6 HOURS AS NEEDED   valACYclovir (VALTREX) 1000 MG tablet Take by mouth.   No facility-administered encounter medications on file as of 12/05/2022.   Lab Results  Component Value Date/Time   HGBA1C 8.0 (H) 10/27/2022 08:54 AM   HGBA1C 9.3 (H) 10/29/2020 01:21 PM   HGBA1C 6.9 09/24/2015 12:00 AM   MICROALBUR 6.9 (H) 05/09/2018 11:24 AM   MICROALBUR <0.7 05/08/2017 02:53 PM    BP Readings from Last 3 Encounters:  11/30/22 108/68  11/22/22 (!) 166/74  11/03/22 124/62   Patient contacted to confirm in office appointment with Mendel Ryder  Foltanski, PharmD, on 12/08/2022 at 2:00.  Do you have any problems getting your medications? No  What is your top health concern you would like to discuss at your upcoming visit? No issues  Have you seen any other providers since your last visit with PCP? Yes  Star Rating Drugs:  Medication:  Last Fill: Day Supply Rosuvastatin 40 mg 09/19/2022 90 Jardiance 10 mg 10/05/2022 90  Care Gaps: Annual wellness visit in last year? Yes 11/01/2022  If Diabetic: Last eye exam / retinopathy screening: Over due Last diabetic foot exam: Up to date  Charlene Brooke, PharmD notified  Marijean Niemann,  [place] Assistant [phone]

## 2022-12-06 ENCOUNTER — Emergency Department (HOSPITAL_COMMUNITY)
Admission: EM | Admit: 2022-12-06 | Discharge: 2022-12-06 | Disposition: A | Discharge status: Home / Self Care | Payer: Medicare Other | Attending: Emergency Medicine | Admitting: Emergency Medicine

## 2022-12-06 ENCOUNTER — Encounter (HOSPITAL_COMMUNITY): Payer: Self-pay

## 2022-12-06 ENCOUNTER — Other Ambulatory Visit: Payer: Self-pay

## 2022-12-06 ENCOUNTER — Emergency Department (HOSPITAL_COMMUNITY): Payer: Medicare Other

## 2022-12-06 DIAGNOSIS — R0789 Other chest pain: Secondary | ICD-10-CM | POA: Diagnosis not present

## 2022-12-06 DIAGNOSIS — Z7982 Long term (current) use of aspirin: Secondary | ICD-10-CM | POA: Insufficient documentation

## 2022-12-06 DIAGNOSIS — R112 Nausea with vomiting, unspecified: Secondary | ICD-10-CM | POA: Diagnosis not present

## 2022-12-06 DIAGNOSIS — R0602 Shortness of breath: Secondary | ICD-10-CM | POA: Diagnosis not present

## 2022-12-06 DIAGNOSIS — I2510 Atherosclerotic heart disease of native coronary artery without angina pectoris: Secondary | ICD-10-CM | POA: Insufficient documentation

## 2022-12-06 DIAGNOSIS — I130 Hypertensive heart and chronic kidney disease with heart failure and stage 1 through stage 4 chronic kidney disease, or unspecified chronic kidney disease: Secondary | ICD-10-CM | POA: Insufficient documentation

## 2022-12-06 DIAGNOSIS — Z951 Presence of aortocoronary bypass graft: Secondary | ICD-10-CM | POA: Insufficient documentation

## 2022-12-06 DIAGNOSIS — Z794 Long term (current) use of insulin: Secondary | ICD-10-CM | POA: Insufficient documentation

## 2022-12-06 DIAGNOSIS — I25708 Atherosclerosis of coronary artery bypass graft(s), unspecified, with other forms of angina pectoris: Secondary | ICD-10-CM | POA: Diagnosis not present

## 2022-12-06 DIAGNOSIS — Z1152 Encounter for screening for COVID-19: Secondary | ICD-10-CM | POA: Insufficient documentation

## 2022-12-06 DIAGNOSIS — I509 Heart failure, unspecified: Secondary | ICD-10-CM | POA: Insufficient documentation

## 2022-12-06 DIAGNOSIS — R197 Diarrhea, unspecified: Secondary | ICD-10-CM | POA: Diagnosis not present

## 2022-12-06 DIAGNOSIS — I499 Cardiac arrhythmia, unspecified: Secondary | ICD-10-CM | POA: Diagnosis not present

## 2022-12-06 DIAGNOSIS — R079 Chest pain, unspecified: Secondary | ICD-10-CM

## 2022-12-06 DIAGNOSIS — Z79899 Other long term (current) drug therapy: Secondary | ICD-10-CM | POA: Diagnosis not present

## 2022-12-06 DIAGNOSIS — Z7901 Long term (current) use of anticoagulants: Secondary | ICD-10-CM | POA: Diagnosis not present

## 2022-12-06 DIAGNOSIS — R072 Precordial pain: Secondary | ICD-10-CM | POA: Insufficient documentation

## 2022-12-06 DIAGNOSIS — R1111 Vomiting without nausea: Secondary | ICD-10-CM | POA: Diagnosis not present

## 2022-12-06 DIAGNOSIS — N189 Chronic kidney disease, unspecified: Secondary | ICD-10-CM | POA: Insufficient documentation

## 2022-12-06 LAB — COMPREHENSIVE METABOLIC PANEL
ALT: 27 U/L (ref 0–44)
AST: 33 U/L (ref 15–41)
Albumin: 3.5 g/dL (ref 3.5–5.0)
Alkaline Phosphatase: 67 U/L (ref 38–126)
Anion gap: 14 (ref 5–15)
BUN: 54 mg/dL — ABNORMAL HIGH (ref 8–23)
CO2: 26 mmol/L (ref 22–32)
Calcium: 8.2 mg/dL — ABNORMAL LOW (ref 8.9–10.3)
Chloride: 91 mmol/L — ABNORMAL LOW (ref 98–111)
Creatinine, Ser: 3.32 mg/dL — ABNORMAL HIGH (ref 0.61–1.24)
GFR, Estimated: 17 mL/min — ABNORMAL LOW (ref >60)
Glucose, Bld: 157 mg/dL — ABNORMAL HIGH (ref 70–99)
Potassium: 3.8 mmol/L (ref 3.5–5.1)
Sodium: 131 mmol/L — ABNORMAL LOW (ref 135–145)
Total Bilirubin: 1.5 mg/dL — ABNORMAL HIGH (ref 0.3–1.2)
Total Protein: 6.5 g/dL (ref 6.5–8.1)

## 2022-12-06 LAB — URINALYSIS, ROUTINE W REFLEX MICROSCOPIC
Bacteria, UA: NONE SEEN
Bilirubin Urine: NEGATIVE
Glucose, UA: >=500 mg/dL — AB
Hgb urine dipstick: NEGATIVE
Ketones, ur: 5 mg/dL — AB
Leukocytes,Ua: NEGATIVE
Nitrite: NEGATIVE
Protein, ur: NEGATIVE mg/dL
Specific Gravity, Urine: 1.011 (ref 1.005–1.030)
pH: 7 (ref 5.0–8.0)

## 2022-12-06 LAB — RESP PANEL BY RT-PCR (RSV, FLU A&B, COVID)  RVPGX2
Influenza A by PCR: NEGATIVE
Influenza B by PCR: NEGATIVE
Resp Syncytial Virus by PCR: NEGATIVE
SARS Coronavirus 2 by RT PCR: NEGATIVE

## 2022-12-06 LAB — CBC WITH DIFFERENTIAL/PLATELET
Abs Immature Granulocytes: 0.02 10*3/uL (ref 0.00–0.07)
Basophils Absolute: 0 10*3/uL (ref 0.0–0.1)
Basophils Relative: 0 %
Eosinophils Absolute: 0 10*3/uL (ref 0.0–0.5)
Eosinophils Relative: 1 %
HCT: 36.6 % — ABNORMAL LOW (ref 39.0–52.0)
Hemoglobin: 12.2 g/dL — ABNORMAL LOW (ref 13.0–17.0)
Immature Granulocytes: 0 %
Lymphocytes Relative: 25 %
Lymphs Abs: 1.5 10*3/uL (ref 0.7–4.0)
MCH: 31.8 pg (ref 26.0–34.0)
MCHC: 33.3 g/dL (ref 30.0–36.0)
MCV: 95.3 fL (ref 80.0–100.0)
Monocytes Absolute: 0.6 10*3/uL (ref 0.1–1.0)
Monocytes Relative: 9 %
Neutro Abs: 4 10*3/uL (ref 1.7–7.7)
Neutrophils Relative %: 65 %
Platelets: 138 10*3/uL — ABNORMAL LOW (ref 150–400)
RBC: 3.84 MIL/uL — ABNORMAL LOW (ref 4.22–5.81)
RDW: 14.4 % (ref 11.5–15.5)
WBC: 6.2 10*3/uL (ref 4.0–10.5)
nRBC: 0 % (ref 0.0–0.2)

## 2022-12-06 LAB — TROPONIN I (HIGH SENSITIVITY)
Troponin I (High Sensitivity): 25 ng/L — ABNORMAL HIGH (ref <18)
Troponin I (High Sensitivity): 24 ng/L — ABNORMAL HIGH (ref <18)

## 2022-12-06 LAB — TSH: TSH: 3.784 u[IU]/mL (ref 0.350–4.500)

## 2022-12-06 LAB — LIPASE, BLOOD: Lipase: 43 U/L (ref 11–51)

## 2022-12-06 LAB — LACTIC ACID, PLASMA
Lactic Acid, Venous: 1.3 mmol/L (ref 0.5–1.9)
Lactic Acid, Venous: 1.1 mmol/L (ref 0.5–1.9)

## 2022-12-06 LAB — BRAIN NATRIURETIC PEPTIDE: B Natriuretic Peptide: 550.8 pg/mL — ABNORMAL HIGH (ref 0.0–100.0)

## 2022-12-06 NOTE — Discharge Instructions (Signed)
Follow-up with your cardiologist in the office soon as possible.  Return to the emergency department for any new or worsening symptoms of concern.

## 2022-12-06 NOTE — Consult Note (Addendum)
Cardiology Consultation   Patient ID: CLEARNCE LEJA MRN: [id number]; DOB: December 10, 1931  Admit date: 12/06/2022 Date of Consult: 12/06/2022  PCP:  Tonia Ghent, MD   [place] Providers Cardiologist:  Quay Burow, MD   {   Patient Profile:   DIO GILLER is a 89 y.o. male with a hx of CAD s/p CABG 1996 and 2006 and subsequent PCIs, chronic A fib on Eliquis, chronic combined CHF, ischemic CM, severe MR s/p MitraClip (02/06/20), carotid artery disease s/p right carotid endarterectomy 1994,  HTN, HLD, CKD IV, type 2 DM, GI bleeding s/p sigmoid colectomy,  OSA,  who is being seen 12/06/2022 for the evaluation of chest pain at the request of Dr Sherry Ruffing.  History of Present Illness:   Mr. Gloss follows Dr Gwenlyn Found outpatient. He underwent CABG in 1996 and redo CABG 2006 for CAD. In 2015 he developed unstable angina. Cardiac catheterization revealed high-grade lesion in SVG to OM which was stented. He had continued chest pain post procedure, P2Y12 study was suboptimal at 266, he was brought back to cath lab which showed segmental 50-60% stenosis proximal to the previously placed stent, this was treated with DES. He was deemed a Plavix nonresponder and switched to Brilinta.   He had chest pain and positive trops during hospitalization for GI bleed 10/2015, treated medically due to CKD IV and inability to tolerate anticoagulation/antiplatelet.   He has hx of long standing persistent A fib, was on coumadin for anticoagulation.   Due to frequent diverticular bleeding requiring hospitalizations and transfusions since 1981, he was taken off coumadin and Brilinta for this reason. He had recurrent bleeding despite being off anticoagulation, and ultimately underwent extended open left hemicolectomy and sigmoidectomy in 01/2016.  He suffered peri-operative NSTEMI, he was treated medically.   Due to progressive dyspnea and chest pain in 2019, he underwent stress myoview 07/12/18 showed LVEF  39%, inferior hypokinesis, fixed medium-sized, severe basal to apical inferior and inferolateral perfusion defect suggestive of prior infarction with minimal ischemia, intermediate risk study. On 08/02/18 he underwent LHC which showed occluded RCA vein graft with left-to-right collaterals which was a new finding comparing to previous cath in 2015. Proximal circumflex and obtuse marginal branch have progressed but unfortunately was not able to cross the obtuse marginal branch ostium. He was recommended aggressive medical therapy due to unfavorable anatomy. He was also started on low dose Eliquis 2.'5mg'$  BID for A fib given cessation of GI bleeding.   He had worsening functional capacity from 2019-2021 with progression SOB/chest tightness with exertion and dependent leg edema. Echo from 06/2019 showed LVEF 40-45%, akinesis of the mid and apical inferior walls and hypokinesis of the inferolateral walls consistent with prior infarct , mod reduced RV, mod MR, mild AI. He was referred to Dr Haroldine Laws on 12/09/19, felt symptom are multifactorial but predominately from severe MR.  TEE 12/2020 revealed moderate left ventricular systolic dysfunction with EF 50 to 55% in the setting of severe mitral regurgitation. Right heart catheterization 01/06/20 was performed demonstrating moderately elevated biventricular pressures with low cardiac output and prominent V waves on pulmonary capillary wedge tracing consistent with severe mitral regurgitation. He was referred to Dr Burt Knack and underwent successful transcatheter edge-to-edge mitral valve repair using a single MitraClip NTW device placed A2/P2, reducing mitral regurgitation from 4+ to 1+ on 02/06/20. Post op echo showed EF 45-50%, normally functioning MitraClip with mild residual mitral regurgitation, mean gradient 4 mmHg. He was discharged on home aspirin and Eliquis. He reported  feeling significant improvement after MitraClip on follow up visit 03/12/20.   He was followed by AHF  clinic after Mitraclip, had ongoing adjustment diuretic and GDMT.   He was last seen by Dr Haroldine Laws 01/27/21, doing well, able to walk 400 yards with mild SOB. Echo on 01/27/21: EF 50%, mitral clip mean gradient of 3 mm Hg. There is significant bidirectional shunting at transeptal puncture. Mild AS. ReDs was 49%. He was advised to continue Lasix '80mg'$  BID, jardiance '10mg'$  daily, hydralazine 25 mg TID, metoprolol '50mg'$  BID, off imdur, and no ARNI/spirolactone due to CKD IV.   He had mentioned some episode of chest pain to Dr Gwenlyn Found on 01/11/22 office visit, but felt not severe enough to need Nitro. He saw NP Venia Carbon on 11/22/22, mentioned worsening chest pain (more frequent and last longer and required Nitro use) and fatigue, with overall stamina decline. Decision was made to continue medical therapy as he has CKD IV and LHC was avoided, imdur '30mg'$  was added.   He was instructed to take metolazone x1 before Lasix, encouraged to take Lasix twice a day with compliance, proBNP was elevated at 6654 on 11/28/22.   He was last seen by Dr Gwenlyn Found on 11/30/22, felt chest pain improved some after taking Imdur. Echo on 11/24/22 showed LVEF worsening to 35-40%, inferior, inferoseptal  and basal to mid septal wall hypokinesis, mildly reduced RV, severely elevated RVSP 60.7 mmHg, mod LAE, mild MR, Mitra-Clip present in the mitral position, mod TR, moderate AS (likely low gradient). Given his CKD IV with Cr 3.7 ranges, mutual decision to avoid invasive intervention and continue medical therapy.  His hydralazine dose was reduced to once a day due to relative hypotension.   Patient presented to ER today via EMS.  He reports episodes of nausea and vomiting after ingestion of clam chowder on Sunday night.  He had ongoing vomiting for the past 3 days.  He felt his energy is down. He started having chest pain today, located at central chest area, was sharp quality, radiating to his back.  Chest pain have subsided after EMS arrival.   He reports recent COVID-19 exposure to a friend last Thursday.  He also reports weight loss over the past 3 weeks after diuretic adjustment recently by the office. He felt overall tired, weak, and wondering why. He felt his leg edema are much better.   Admission diagnostic today revealed hyponatremia 131, hypochloremia 91, BUN 54, creatinine 3.34, GFR 17, last 2 total bilirubin 1.5.  BNP 550.  High sensitive troponin 25 >24.  Hemoglobin 12.2, platelet 138k, otherwise CBC differential unremarkable.  TSH WNL.  Flu /COVID /RSV negative.  UA positive for glucose and ketones.  Chest x-ray revealed no acute finding.EKG revealed atrial fibrillation with ventricular rate 62 bpm, RBBB, no significant change.  Cardiology is consulted for chest pain.   Past Medical History:  Diagnosis Date   Age-related macular degeneration, wet, both eyes ([place])    Anemia    Secondary to acute blood loss   Anginal pain ([name])    last chest pain in Feb 2021   Arthritis    "mild in hands, knees, ankles" (11/13/2015)   Atrial fibrillation ([place])    Consideration was given for atrial flutter ablation, but patient developed atrial fibrillation. Cardioversion was done. Dr. Caryl Comes decided to watch him clinically. November, 2011   Atrial flutter [facility]) 07/2010   September, 2011   Hospital with PNA and cath done.Marland KitchenMarland KitchenCoumadin.  Atrial flutter ablation planned, but  pt. then  had atrial fibrillation,/outpatient conversion 09/08/10..NSR..plan to follow..Dr. Caryl Comes   CAD (coronary artery disease)    Catheterization, September, 2011,  grafts patent from redo CABG,, medical therapy of coronary disease, consideration to proceeding with atrial flutter ablation   Carotid artery disease ([place])    Doppler 09/18/2009 - 49% bilateral stenoses   Carotid artery disease ([name])    49% bilateral, Doppler, November, 2010   CHF (congestive heart failure) ([place])    Chronic kidney disease (CKD), stage III (moderate) ([place])    Diabetic peripheral neuropathy ([place])     feet   Diverticulosis of colon with hemorrhage 2009   several unit diverticular bleed    Erectile dysfunction    Mild   Gout    Hearing loss    wears hearing aids   History of blood transfusion "several"   related to diverticular bleeding   Hyperlipidemia    Hypertension    Mitral regurgitation    Mild, echo, September, 2011   NSTEMI (non-ST elevated myocardial infarction) ([place]) 07/2010   at [facility] with repeat cath, rec medical mgmt    OSA on CPAP    uses CPAP nightly   Personal history of colonic polyps    PNA (pneumonia) 9/11   NSTEMI at [facility] with repeat cath, rec medical mgmt    RBBB (right bundle branch block)    S/P mitral valve repair 02/06/2020   s/p TEER with a single MitraClip NTW placed on A2/P2   Shoulder pain    "positional; better now" (11/13/2015)   Skin cancer    R lower leg, per derm 2012   Type II diabetes mellitus ([place])     Past Surgical History:  Procedure Laterality Date   CARDIAC CATHETERIZATION  2006   Nuclear..slight lateral ischemia..medical therapy   CARDIAC CATHETERIZATION  08/04/2010   grafts patent from redo CABG...medical Rx and ablate Atrial flutter (LV not injected)    CARDIOVERSION  ~ 2010   CAROTID ENDARTERECTOMY Right 1994   CATARACT EXTRACTION W/ INTRAOCULAR LENS  IMPLANT, BILATERAL Bilateral    COLON RESECTION N/A 01/13/2016   Procedure: EXPLORATORY LAPAROTOMY, LEFT AND SIGMOID COLON REMOVAL;  Surgeon: Ralene Ok, MD;  Location: [place];  Service: General;  Laterality: N/A;  Extended open left hemicolectomy and sigmoidectomy    COLONOSCOPY N/A 11/14/2015   Procedure: COLONOSCOPY;  Surgeon: Manus Gunning, MD;  Location: [place];  Service: Gastroenterology;  Laterality: N/A;   COLONOSCOPY Left 01/05/2016   Procedure: COLONOSCOPY;  Surgeon: Manus Gunning, MD;  Location: [place];  Service: Gastroenterology;  Laterality: Left;  no sedation to start, moderate if needed   COLONOSCOPY W/ POLYPECTOMY     CORONARY  ARTERY BYPASS GRAFT  1995; 2006   "X 3; X3"   CORONARY ARTERY BYPASS GRAFT  1995, 2006   DOPPLER ECHOCARDIOGRAPHY  08/2008   EF 60%   DOPPLER ECHOCARDIOGRAPHY  08/02/2010   65-70%   DOPPLER ECHOCARDIOGRAPHY  07/2010   MR mild   ESOPHAGOGASTRODUODENOSCOPY N/A 06/19/2014   Procedure: ESOPHAGOGASTRODUODENOSCOPY (EGD);  Surgeon: Jerene Bears, MD;  Location: [facility] ENDOSCOPY;  Service: Endoscopy;  Laterality: N/A;   ESOPHAGOGASTRODUODENOSCOPY N/A 11/14/2015   Procedure: ESOPHAGOGASTRODUODENOSCOPY (EGD);  Surgeon: Manus Gunning, MD;  Location: [place];  Service: Gastroenterology;  Laterality: N/A;   FLEXIBLE SIGMOIDOSCOPY N/A 11/16/2015   Procedure: FLEXIBLE SIGMOIDOSCOPY;  Surgeon: Manus Gunning, MD;  Location: [facility] ENDOSCOPY;  Service: Gastroenterology;  Laterality: N/A;   LAPAROSCOPIC CHOLECYSTECTOMY  2008   LEFT HEART CATH N/A 06/14/2014  Procedure: LEFT HEART CATH;  Surgeon: Sinclair Grooms, MD;  Location: [facility] CATH LAB;  Service: Cardiovascular;  Laterality: N/A;   LEFT HEART CATH AND CORONARY ANGIOGRAPHY N/A 08/02/2018   Procedure: LEFT HEART CATH AND CORONARY ANGIOGRAPHY;  Surgeon: [name], MD;  Location: [place] CV LAB;  Service: Cardiovascular;  Laterality: N/A;   LEFT HEART CATHETERIZATION WITH CORONARY/GRAFT ANGIOGRAM N/A 06/11/2014   Procedure: LEFT HEART CATHETERIZATION WITH Beatrix Fetters;  Surgeon: Sinclair Grooms, MD;  Location: [facility] CATH LAB;  Service: Cardiovascular;  Laterality: N/A;   MITRAL VALVE REPAIR N/A 02/06/2020   Procedure: MITRAL VALVE REPAIR;  Surgeon: Sherren Mocha, MD;  Location: [place] CV LAB;  Service: Cardiovascular;  Laterality: N/A;   PERCUTANEOUS CORONARY STENT INTERVENTION (PCI-S) N/A 06/13/2014   Procedure: PERCUTANEOUS CORONARY STENT INTERVENTION (PCI-S);  Surgeon: Sinclair Grooms, MD;  Location: [facility] CATH LAB;  Service: Cardiovascular;  Laterality: N/A;   RIGHT HEART CATH N/A 01/06/2020   Procedure: RIGHT HEART CATH;   Surgeon: Jolaine Artist, MD;  Location: [place] CV LAB;  Service: Cardiovascular;  Laterality: N/A;   SKIN CANCER EXCISION Left 10/2015   calf   SKIN CANCER EXCISION Right 2014?   chest   TEE WITHOUT CARDIOVERSION N/A 01/06/2020   Procedure: TRANSESOPHAGEAL ECHOCARDIOGRAM (TEE);  Surgeon: Jolaine Artist, MD;  Location: [facility] ENDOSCOPY;  Service: Cardiovascular;  Laterality: N/A;   TONSILLECTOMY     VASECTOMY       Home Medications:  Prior to Admission medications   Medication Sig Start Date End Date Taking? Authorizing Provider  allopurinol (ZYLOPRIM) 100 MG tablet TAKE ONE-HALF (1/2) TABLET EVERY MONDAY, [facility] AND FRIDAY 10/17/22   Tonia Ghent, MD  amoxicillin (AMOXIL) 500 MG tablet Take by mouth. Patient takes 4 tablets prior to going to the dentist 12/28/21   [provider]  aspirin 81 MG tablet Take 81 mg by mouth daily.    [provider]  BD INSULIN SYRINGE ULTRAFINE 31G X 5/16" 0.3 ML MISC USE DAILY AS INSTRUCTED 12/02/15   Tonia Ghent, MD  Cholecalciferol (VITAMIN D) 1000 UNITS capsule Take 1,000 Units by mouth daily.     [provider]  diclofenac Sodium (VOLTAREN) 1 % GEL Apply 2 g topically 4 (four) times daily. 07/20/20   Orvan July, NP  ELIQUIS 2.5 MG TABS tablet TAKE 1 TABLET TWICE A DAY 12/13/21   Eileen Stanford, PA-C  empagliflozin (JARDIANCE) 10 MG TABS tablet Take 1 tablet (10 mg total) by mouth daily before breakfast. PLEASE CALL THE OFFICE TO ARRANGE YOUR FOLLOW UP APPOINTMENT 07/07/22   Bensimhon, Shaune Pascal, MD  fish oil-omega-3 fatty acids 1000 MG capsule Take 1 g by mouth daily.    [provider]  furosemide (LASIX) 80 MG tablet TAKE 1 TABLET TWICE A DAY (DOSAGE CHANGE) 11/04/21   [name], MD  glucose blood (FREESTYLE LITE) test strip USE TO TEST BLOOD SUGAR ONCE DAILY AND AS NEEDED. Dx E11.9 08/21/22   Tonia Ghent, MD  hydrALAZINE (APRESOLINE) 25 MG tablet Take 1 tablet (25 mg total) by mouth  daily. 12/02/22   [name], MD  ipratropium (ATROVENT) 0.03 % nasal spray USE 2 SPRAYS IN EACH NOSTRIL TWICE A DAY AS NEEDED FOR RHINITIS 12/15/21   Tonia Ghent, MD  isosorbide mononitrate (IMDUR) 30 MG 24 hr tablet Take 1 tablet (30 mg total) by mouth daily. 11/22/22   Trudi Ida, NP  LANTUS  100 UNIT/ML injection INJECT 20 TO 25 UNITS AT BEDTIME 08/15/22   Tonia Ghent, MD  magnesium oxide (MAG-OX) 400 MG tablet Take 400 mg by mouth daily.    [provider]  metolazone (ZAROXOLYN) 2.5 MG tablet Take 1 tablet (2.5 mg total) by mouth daily. Take 30 minutes prior to taking morning dose of Lasix 11/25/22   Trudi Ida, NP  metoprolol tartrate (LOPRESSOR) 50 MG tablet TAKE 1 TABLET(50 MG) BY MOUTH TWICE DAILY 10/11/22   [name], MD  Multiple Vitamin (MULTIVITAMIN WITH MINERALS) TABS Take 1 tablet by mouth daily.    [provider]  nitroGLYCERIN (NITROSTAT) 0.4 MG SL tablet DISSOLVE 1 TABLET UNDER THE TONGUE EVERY 5 MINUTES AS NEEDED FOR CHEST PAIN FOR A MAXIMUM OF 3 DOSES 07/01/22   [name], MD  Potassium Gluconate 595 MG CAPS Take 595 mg by mouth daily.    [provider]  ranolazine (RANEXA) 500 MG 12 hr tablet TAKE 1 TABLET TWICE A DAY 03/14/22   [name], MD  rosuvastatin (CRESTOR) 40 MG tablet TAKE 1 TABLET AT BEDTIME 09/19/22   [name], MD  traMADol (ULTRAM) 50 MG tablet TAKE 1 TABLET EVERY 6 HOURS AS NEEDED 08/31/22   Tonia Ghent, MD  valACYclovir (VALTREX) 1000 MG tablet Take by mouth. 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     Lives with girlfriend [name].  Widowed 12/2005.   Social Determinants of Health   Financial Resource Strain: Low Risk  (11/01/2022)   Overall Financial Resource Strain (CARDIA)    Difficulty of Paying Living Expenses: Not hard at all  Food Insecurity: No Food Insecurity (11/01/2022)   Hunger Vital Sign    Worried About Running Out of Food in the Last Year: Never true    Ran Out of Food in the Last Year: Never true  Transportation Needs: No Transportation Needs (11/01/2022)   PRAPARE - Hydrologist (Medical): No    Lack of Transportation (Non-Medical): No  Physical Activity: Inactive (11/01/2022)   Exercise Vital Sign    Days of Exercise per Week: 0 days    Minutes of Exercise per Session: 0 min  Stress: No Stress Concern Present (11/01/2022)  [employer] - Occupational Stress Questionnaire    Feeling of Stress : Not at all  Social Connections: Moderately Isolated (11/01/2022)   Social Connection and Isolation Panel [NHANES]    Frequency of Communication with Friends and Family: More than three times a week    Frequency of Social Gatherings with Friends and Family: More than three times a week    Attends Religious Services: Never    Marine scientist or Organizations: Yes    Attends Theatre manager Meetings: 1 to 4 times per year    Marital Status: Widowed  Intimate Partner Violence: Not At Risk (11/01/2022)   Humiliation, Afraid, Rape, and Kick questionnaire    Fear of Current or Ex-Partner: No    Emotionally Abused: No    Physically Abused: No    Sexually Abused: No    Family History:    Family History  Problem Relation Age of Onset   Kidney disease Mother        Kidney failure   Stroke Mother    Diabetes Mother    Heart disease Father        MI   Arthritis Sister    Cancer Sister        Throat   Heart disease Sister        MI   Diabetes Sister    Prostate cancer Neg Hx    Colon cancer Neg Hx      ROS:  Constitutional: Denied fever, chills, malaise, night sweats Eyes: Denied vision change or loss Ears/Nose/Mouth/Throat: Denied ear ache, sore throat, coughing, sinus pain Cardiovascular: see HPI  Respiratory: see HPI  Gastrointestinal: Denied nausea, vomiting, abdominal pain, diarrhea Genital/Urinary: Denied dysuria, hematuria, urinary frequency/urgency Musculoskeletal: Denied muscle ache, joint pain, weakness Skin: Denied rash, wound Neuro: Denied headache, dizziness, syncope Psych: Denied history of depression/anxiety  Endocrine: history of diabetes   Physical Exam/Data:   Vitals:   12/06/22 1430 12/06/22 1500 12/06/22 1530 12/06/22 1600  BP: 116/78 116/70 (!) 139/53 (!) 141/58  Pulse: 63 95 60 (!) 56  Resp: '13 17 18 14  '$ Temp:      TempSrc:      SpO2: 100% 100% 100% 100%  Weight:      Height:       No intake or output data in the 24 hours ending 12/06/22 1647    12/06/2022   12:59 PM 11/30/2022   11:48 AM 11/22/2022    3:41 PM  Last 3 Weights  Weight (lbs) 155 lb 159 lb 6.4 oz 172 lb 6.4 oz  Weight (kg) 70.308 kg 72.303 kg 78.2 kg     Body mass index is 24.28 kg/m.   Vitals:  Vitals:   12/06/22 1530 12/06/22 1600  BP: (!) 139/53 (!) 141/58  Pulse: 60 (!) 56  Resp: 18 14  Temp:    SpO2: 100% 100%   General Appearance: In  no apparent distress, laying in bed HEENT: Normocephalic, atraumatic.  Neck: Supple, trachea midline, no JVDs Cardiovascular: Irregularly irregular,  normal S1-S2,  soft murmur  Respiratory: Resting breathing unlabored, lungs sounds clear to auscultation bilaterally, no use of accessory muscles. On room air.  No wheezes, rales or rhonchi.   Gastrointestinal: Bowel sounds positive, abdomen soft Extremities: Able to move all extremities in bed without difficulty, trace edema of BLE Musculoskeletal: Normal muscle bulk and tone Skin: Intact, warm, dry.  Neurologic: Alert, oriented to person, place and time. Fluent speech,no cognitive deficit,  no gross focal  neuro deficit Psychiatric: Normal affect. Mood is appropriate.     EKG:  The EKG was personally reviewed and demonstrates:    A fib 62 bpm, old RBBB  Telemetry:  Telemetry was personally reviewed and demonstrates:    A fib with VR 60s   Relevant CV Studies:   Echo 11/24/22:  1. Left ventricular ejection fraction, by estimation, is 35 to 40%. The  left ventricle has moderately decreased function. The left ventricle  demonstrates regional wall motion abnormalities (inferior, inferoseptal  and basal to mid septal wall  hypokinesis). There is mild to moderate left ventricular hypertrophy. Left  ventricular diastolic parameters are indeterminate.   2. Right ventricular systolic function is mildly reduced. The right  ventricular size is moderately enlarged. There is severely elevated  pulmonary artery systolic pressure. The estimated right ventricular  systolic pressure is 45.6 mmHg.   3. Left atrial size was moderately dilated.   4. The mitral valve is normal in structure. Mild mitral valve  regurgitation. No evidence of mitral stenosis. There is a Mitra-Clip  present in the mitral position. Procedure Date: 02/06/20.   5. Tricuspid valve regurgitation is moderate.   6. The aortic valve is tricuspid. There is moderate calcification  of the  aortic valve. Aortic valve regurgitation is not visualized. No aortic  stenosis is present. Aortic valve area, by VTI measures 0.87 cm. Aortic  valve mean gradient measures 8.2  mmHg. Aortic valve Vmax measures 1.94 m/s.   7. The inferior vena cava is normal in size with greater than 50%  respiratory variability, suggesting right atrial pressure of 3 mmHg.   8. There is a small atrial septal defect with predominantly left to right  shunting across the atrial septum.   Echo 01/17/22:  1. Hypokinesis /akiensis of the inferior wall (base/mid). Left  ventricular ejection fraction, by estimation, is 505%. The left ventricle  has normal function. There is mild left ventricular hypertrophy. Left  ventricular diastolic parameters are  indeterminate.   2. Right ventricular systolic function is low normal. The right  ventricular size is mildly enlarged. There is moderately elevated  pulmonary artery systolic pressure.   3. Left atrial size was mild to moderately dilated.   4. Right atrial size was moderately dilated.   5. S/p Mitra Clip (02/06/20) The clip appears well seatad. Peak and mean  gradients through the valve arer 13 and 5 mm Hg( HR 68 bpm). This is  relatively unchanged from echo report from 2020. Mild mitral valve  regurgitation. There is a Mitra-Clip present  in the mitral position. Procedure Date: 02/06/2020.   6. AV is thickened with minimally restricted motion. Peak and mean  gradients through the valve aer 18 and 10 mm HG respectively Acoustic  windows diffilcult. Not clear on accurate measures of LVOT to make further  calculations. . The aortic valve is  tricuspid. Aortic valve regurgitation is mild.   7. Aortic dilatation noted. There is mild dilatation of the ascending  aorta, measuring 39 mm.   8. The inferior vena cava is dilated in size with <50% respiratory  variability, suggesting right atrial pressure of 15 mmHg.   9. Evidence of atrial level shunting detected  by color flow Doppler.  There is a large patent foramen ovale with bidirectional shunting across  atrial septum.   [place] on 01/06/20:   RA = 10 RV = 58/8 PA = 58/19 (33) PCW = 20 (v waves to 35)  Fick cardiac output/index = 4.3/2.1 Thermo CO/CI =  3.2/1.7 PVR = 3.1 (Fick) 4.0 (thermo) Ao sat = 99% PA sat = 64%, 68%   Assessment:   1.  Moderately elevated biventricular pressures with low cardiac output  2.  Prominent v-waves on PCWP tracing c/w known severe MR   Plan/Discussion:   Will review possible Mitraclip with structural heart team.   LHC on 08/02/18:  Ost RCA to Prox RCA lesion is 100% stenosed. Ost LAD to Prox LAD lesion is 100% stenosed. Ost Cx lesion is 70% stenosed. Ost 1st Mrg lesion is 90% stenosed. Prox Cx lesion is 60% stenosed. Origin to Prox Graft lesion is 100% stenosed. Previously placed Origin to Mid Graft stent (unknown type) is widely patent.    IMPRESSION: Mr. Kamau has an occluded RCA vein graft with left-to-right collaterals which is a new finding since his last cath 4 years ago.  Proximal circumflex and obtuse marginal branch have progressed but unfortunately was not able to cross the obtuse marginal branch ostium.  This came out from the AV groove had a right angle and was high-grade and calcified.  At this point, I recommend aggressive antianginal medical therapy.  Angiomax was discontinued.  Sheath will be removed and pressure held.  Patient be hydrated and discharged home in the morning.  He left the lab in stable condition.    Laboratory Data:  High Sensitivity Troponin:   Recent Labs  Lab 12/06/22 1315 12/06/22 1455  TROPONINIHS 25* 24*     Chemistry Recent Labs  Lab 12/06/22 1315  NA 131*  K 3.8  CL 91*  CO2 26  GLUCOSE 157*  BUN 54*  CREATININE 3.32*  CALCIUM 8.2*  GFRNONAA 17*  ANIONGAP 14    Recent Labs  Lab 12/06/22 1315  PROT 6.5  ALBUMIN 3.5  AST 33  ALT 27  ALKPHOS 67  BILITOT 1.5*   Lipids No results for  input(s): "CHOL", "TRIG", "HDL", "LABVLDL", "LDLCALC", "CHOLHDL" in the last 168 hours.  Hematology Recent Labs  Lab 12/06/22 1315  WBC 6.2  RBC 3.84*  HGB 12.2*  HCT 36.6*  MCV 95.3  MCH 31.8  MCHC 33.3  RDW 14.4  PLT 138*   Thyroid  Recent Labs  Lab 12/06/22 1330  TSH 3.784    BNP Recent Labs  Lab 12/06/22 1315  BNP 550.8*    DDimer No results for input(s): "DDIMER" in the last 168 hours.   Radiology/Studies:  DG Chest Portable 1 View  Result Date: 12/06/2022 CLINICAL DATA:  Worsening exertional shortness of breath EXAM: PORTABLE CHEST 1 VIEW COMPARISON:  02/03/2020 FINDINGS: Previous median sternotomy and CABG. Heart size upper limits of normal. Chronic aortic atherosclerotic calcification. The lungs are clear. No evidence of heart failure or pneumonia. No effusion. Chronic mild elevation of the left hemidiaphragm. IMPRESSION: No active disease. Previous CABG. Aortic atherosclerosis. Chronic mild elevation of the left hemidiaphragm. Electronically Signed   By: Nelson Chimes M.D.   On: 12/06/2022 13:44     Assessment and Plan:   Chest pain Multivessel CAD s/p CABG 1996 and 2006 and subsequent PCI last on 2015 - presented with chest pain, largely unchanged from baseline, in the setting of GI illness  - HS trop 25 >24, not consistent with ACS  - EKG no acute change - chest pain likely due to recurrent nausea and vomiting - CKD IV within baseline  - no invasive workup recommended due to renal function  - continue current medical therapy with ASA '81mg'$  daily, metoprolol '50mg'$  BID, ranexa '500mg'$  BID, Imdur  $'30mg'h$  daily, crestor '40mg'$  daily  - follow up arranged on 12/16/22 with cardiology   Chronic systolic and diastolic heart failure - Echo with worsening LVEF 11/24/22 - He reports less leg edema and weight loss after recent use of metalozone x1 and lasix '80mg'$  BID  - he is euvolemic on exam today, may hold lasix and resume Thursday once PO intake is adequate and vomiting  resolves  - GDMT: continue metoprolol, jardiance, imdur, hydralazine, not candidate for ARNI and MRA due to CKD IV  Chronic A fib - remains in A fib, rate controlled on BB, continue anticoagulation with low dose Eliquis  HTN - BP is 664 systolic today, had recent lowering of hydralazine, keep track BP     Risk Assessment/Risk Scores:    New York Heart Association (NYHA) Functional Class NYHA Class II  CHA2DS2-VASc Score = 6   This indicates a 9.7% annual risk of stroke. The patient's score is based upon: CHF History: 1 HTN History: 1 Diabetes History: 1 Stroke History: 0 Vascular Disease History: 1 Age Score: 2 Gender Score: 0    For questions or updates, please contact [facility] Please consult [email or web address] for contact info under    Signed, Margie Billet, NP  12/06/2022 4:47 PM   I have personally seen and examined this patient. I agree with the assessment and plan as outlined above. 89 yo male with history of CAD s/p CABG in 1996 and redo CABG in 2006 with subsequent PCIs, persistent atrial fib on Eliquis, LV systolic dysfunction from presumed ischemic cardiomyopathy, chronic systolic CHF, CKD stage 4, severe MR s/p MitraClip, PAD, HTN, HLD, DM and sleep apnea who presented to the ED today with c/o N/V and poor po intake for 3 days. Renal function is stable. Troponin is not significantly elevated. He was seen one week ago by Dr. Gwenlyn Found and they discussed not pursuing any ischemic testing for change in his LVEF. (Not a good candidate for studies using IV contrast secondary to CKD). His Lasix was increased recently and his LE edema has resolved. He has not really had a change in chronic chest pain over the past week. N/V started 3 days ago. No complaints today other than fatigue.  Tele with atrial fib, rate 60-75 bpm EKG with atrial fibrillation. Labs reviewed  My exam: General: Well developed, well nourished, NAD HEENT: OP clear SKIN: warm, dry. No  rashes. Neuro: No focal deficits Musculoskeletal: Muscle strength 5/5 all ext Psychiatric: Mood and affect normal Neck: No JVD Lungs:Clear bilaterally, no wheezes, rhonci, crackles Cardiovascular: Irreg irreg with systolic murmur Abdomen:Soft. Extremities: Trace left LE edema, no right LE edema  Plan: Elderly patient with known stage 4 CKD, ischemic cardiomyopathy, advanced CAD post bypass and redo bypass presenting to the ED with Nausea and vomiting. No evidence of ACS. I suspect he is hypovolemic after recent up titration of his Lasix. His symptoms seem to be due a viral gastroenteritis. I do not think he needs any further cardiac workup at this time We have asked him to hold his Lasix tonight and tomorrow. Push po intake Resume Lasix at lower dose (80 mg per day) on Thursday 12/08/22.  Lauree Chandler, MD, [facility] 12/06/2022  5:01 PM

## 2022-12-06 NOTE — ED Provider Notes (Signed)
[facility] Provider Note   CSN: [id number] Arrival date & time: 12/06/22  1250     History  Chief Complaint  Patient presents with   Chest Pain    [name] is a 89 y.o. male.  The history is provided by the patient, medical records and a relative. No language interpreter was used.  Chest Pain Pain location:  L chest and substernal area Pain quality: aching and pressure   Pain radiates to:  L shoulder and L arm Pain severity:  Moderate Onset quality:  Gradual Duration:  1 day Timing:  Constant Progression:  Waxing and waning Chronicity:  New Relieved by:  Nothing Worsened by:  Nothing Ineffective treatments:  None tried Associated symptoms: fatigue, nausea, shortness of breath and vomiting   Associated symptoms: no abdominal pain, no altered mental status, no anorexia, no back pain, no cough, no diaphoresis, no dizziness, no fever, no headache, no lower extremity edema, no numbness, no palpitations and no syncope        Home Medications Prior to Admission medications   Medication Sig Start Date End Date Taking? Authorizing Provider  allopurinol (ZYLOPRIM) 100 MG tablet TAKE ONE-HALF (1/2) TABLET EVERY MONDAY, [facility] AND FRIDAY 10/17/22   Tonia Ghent, MD  amoxicillin (AMOXIL) 500 MG tablet Take by mouth. Patient takes 4 tablets prior to going to the dentist 12/28/21   [provider]  aspirin 81 MG tablet Take 81 mg by mouth daily.    [provider]  BD INSULIN SYRINGE ULTRAFINE 31G X 5/16" 0.3 ML MISC USE DAILY AS INSTRUCTED 12/02/15   Tonia Ghent, MD  Cholecalciferol (VITAMIN D) 1000 UNITS capsule Take 1,000 Units by mouth daily.     [provider]  diclofenac Sodium (VOLTAREN) 1 % GEL Apply 2 g topically 4 (four) times daily. 07/20/20   Orvan July, NP  ELIQUIS 2.5 MG TABS tablet TAKE 1 TABLET TWICE A DAY 12/13/21   Eileen Stanford, PA-C  empagliflozin (JARDIANCE) 10 MG TABS  tablet Take 1 tablet (10 mg total) by mouth daily before breakfast. PLEASE CALL THE OFFICE TO ARRANGE YOUR FOLLOW UP APPOINTMENT 07/07/22   Bensimhon, Shaune Pascal, MD  fish oil-omega-3 fatty acids 1000 MG capsule Take 1 g by mouth daily.    [provider]  furosemide (LASIX) 80 MG tablet TAKE 1 TABLET TWICE A DAY (DOSAGE CHANGE) 11/04/21   [name], MD  glucose blood (FREESTYLE LITE) test strip USE TO TEST BLOOD SUGAR ONCE DAILY AND AS NEEDED. Dx E11.9 08/21/22   Tonia Ghent, MD  hydrALAZINE (APRESOLINE) 25 MG tablet Take 1 tablet (25 mg total) by mouth daily. 12/02/22   [name], MD  ipratropium (ATROVENT) 0.03 % nasal spray USE 2 SPRAYS IN EACH NOSTRIL TWICE A DAY AS NEEDED FOR RHINITIS 12/15/21   Tonia Ghent, MD  isosorbide mononitrate (IMDUR) 30 MG 24 hr tablet Take 1 tablet (30 mg total) by mouth daily. 11/22/22   Trudi Ida, NP  LANTUS 100 UNIT/ML injection INJECT 20 TO 25 UNITS AT BEDTIME 08/15/22   Tonia Ghent, MD  magnesium oxide (MAG-OX) 400 MG tablet Take 400 mg by mouth daily.    [provider]  metolazone (ZAROXOLYN) 2.5 MG tablet Take 1 tablet (2.5 mg total) by mouth daily. Take 30 minutes prior to taking morning dose of Lasix 11/25/22   Trudi Ida, NP  metoprolol tartrate (LOPRESSOR) 50 MG tablet  TAKE 1 TABLET(50 MG) BY MOUTH TWICE DAILY 10/11/22   [name], MD  Multiple Vitamin (MULTIVITAMIN WITH MINERALS) TABS Take 1 tablet by mouth daily.    [provider]  nitroGLYCERIN (NITROSTAT) 0.4 MG SL tablet DISSOLVE 1 TABLET UNDER THE TONGUE EVERY 5 MINUTES AS NEEDED FOR CHEST PAIN FOR A MAXIMUM OF 3 DOSES 07/01/22   [name], MD  Potassium Gluconate 595 MG CAPS Take 595 mg by mouth daily.    [provider]  ranolazine (RANEXA) 500 MG 12 hr tablet TAKE 1 TABLET TWICE A DAY 03/14/22   [name], MD  rosuvastatin (CRESTOR) 40 MG tablet TAKE 1 TABLET AT BEDTIME 09/19/22   [name], MD   traMADol (ULTRAM) 50 MG tablet TAKE 1 TABLET EVERY 6 HOURS AS NEEDED 08/31/22   Tonia Ghent, MD  valACYclovir (VALTREX) 1000 MG tablet Take by mouth. 08/30/22   [provider]      Allergies    Patient has no known allergies.    Review of Systems   Review of Systems  Constitutional:  Positive for fatigue. Negative for chills, diaphoresis and fever.  HENT:  Negative for congestion.   Eyes:  Negative for visual disturbance.  Respiratory:  Positive for shortness of breath. Negative for cough, chest tightness and wheezing.   Cardiovascular:  Positive for chest pain. Negative for palpitations, leg swelling and syncope.  Gastrointestinal:  Positive for diarrhea, nausea and vomiting. Negative for abdominal pain, anorexia and constipation.  Genitourinary:  Negative for dysuria and flank pain.  Musculoskeletal:  Negative for back pain, neck pain and neck stiffness.  Skin:  Negative for rash and wound.  Neurological:  Positive for light-headedness. Negative for dizziness, numbness and headaches.  Psychiatric/Behavioral:  Negative for agitation and confusion.   All other systems reviewed and are negative.   Physical Exam Updated Vital Signs Ht '5\' 7"'$  (1.702 m)   Wt 70.3 kg   BMI 24.28 kg/m  Physical Exam Vitals and nursing note reviewed.  Constitutional:      General: He is not in acute distress.    Appearance: He is well-developed. He is not ill-appearing, toxic-appearing or diaphoretic.  HENT:     Head: Normocephalic and atraumatic.  Eyes:     Conjunctiva/sclera: Conjunctivae normal.     Pupils: Pupils are equal, round, and reactive to light.  Cardiovascular:     Rate and Rhythm: Normal rate and regular rhythm.     Heart sounds: Murmur heard.  Pulmonary:     Effort: Pulmonary effort is normal. No respiratory distress.     Breath sounds: Normal breath sounds. No decreased breath sounds, wheezing, rhonchi or rales.  Chest:     Chest wall: No tenderness.  Abdominal:      Palpations: Abdomen is soft.     Tenderness: There is no abdominal tenderness.  Musculoskeletal:        General: No swelling.     Cervical back: Neck supple.     Right lower leg: No tenderness. No edema.     Left lower leg: No tenderness. No edema.  Skin:    General: Skin is warm and dry.     Capillary Refill: Capillary refill takes less than 2 seconds.  Neurological:     General: No focal deficit present.     Mental Status: He is alert.  Psychiatric:        Mood and Affect: Mood normal.     ED Results / Procedures /  Treatments   Labs (all labs ordered are listed, but only abnormal results are displayed) Labs Reviewed  CBC WITH DIFFERENTIAL/PLATELET - Abnormal; Notable for the following components:      Result Value   RBC 3.84 (*)    Hemoglobin 12.2 (*)    HCT 36.6 (*)    Platelets 138 (*)    All other components within normal limits  COMPREHENSIVE METABOLIC PANEL - Abnormal; Notable for the following components:   Sodium 131 (*)    Chloride 91 (*)    Glucose, Bld 157 (*)    BUN 54 (*)    Creatinine, Ser 3.32 (*)    Calcium 8.2 (*)    Total Bilirubin 1.5 (*)    GFR, Estimated 17 (*)    All other components within normal limits  BRAIN NATRIURETIC PEPTIDE - Abnormal; Notable for the following components:   B Natriuretic Peptide 550.8 (*)    All other components within normal limits  URINALYSIS, ROUTINE W REFLEX MICROSCOPIC - Abnormal; Notable for the following components:   Glucose, UA >=500 (*)    Ketones, ur 5 (*)    All other components within normal limits  TROPONIN I (HIGH SENSITIVITY) - Abnormal; Notable for the following components:   Troponin I (High Sensitivity) 25 (*)    All other components within normal limits  RESP PANEL BY RT-PCR (RSV, FLU A&B, COVID)  RVPGX2  LIPASE, BLOOD  LACTIC ACID, PLASMA  TSH  LACTIC ACID, PLASMA  TROPONIN I (HIGH SENSITIVITY)    EKG EKG Interpretation  Date/Time:  Tuesday December 06 2022 13:03:21 EST Ventricular  Rate:  62 PR Interval:    QRS Duration: 179 QT Interval:  517 QTC Calculation: 526 R Axis:   -60 Text Interpretation: Atrial fibrillation IVCD, consider atypical RBBB LVH with IVCD, LAD and secondary repol abnrm Prolonged QT interval when compared to prior, overall similar appearance. No STEMI Confirmed by Antony Blackbird [phone]) on 12/06/2022 1:18:19 PM  Radiology DG Chest Portable 1 View  Result Date: 12/06/2022 CLINICAL DATA:  Worsening exertional shortness of breath EXAM: PORTABLE CHEST 1 VIEW COMPARISON:  02/03/2020 FINDINGS: Previous median sternotomy and CABG. Heart size upper limits of normal. Chronic aortic atherosclerotic calcification. The lungs are clear. No evidence of heart failure or pneumonia. No effusion. Chronic mild elevation of the left hemidiaphragm. IMPRESSION: No active disease. Previous CABG. Aortic atherosclerosis. Chronic mild elevation of the left hemidiaphragm. Electronically Signed   By: Nelson Chimes M.D.   On: 12/06/2022 13:44    Procedures Procedures    Medications Ordered in ED Medications - No data to display  ED Course/ Medical Decision Making/ A&P                             Medical Decision Making Amount and/or Complexity of Data Reviewed Labs: ordered. Radiology: ordered.    EBRIMA RANTA is a 89 y.o. male with a past medical history significant for CAD status post CABG x 2 and previous PCI, previous GI bleeding, CHF, hypertension, hyperlipidemia, pulmonary hypertension, paroxysmal atrial fibrillation on Eliquis therapy, previous mitral valve repair, CKD, and arthritis who presents with several days of intermittent nausea and vomiting, decreased oral intake, weight loss, fatigue, exertional chest pain, exertional shortness of breath, and malaise.  According to patient, he was exposed to someone with COVID about a week ago and since Friday has had some nausea.  Reports vomiting has been waxing waning and he is also had  loose stools.  He denies any  abdominal pain whatsoever but reports she has been having chest discomfort for the last day or 2.  He reports that it feels similar to previous pain he has had with MI but not quite as severe.  He does report exertional shortness of breath for the last few weeks.  He reports that he had increased his diuretics recently for fluid overload and now he is doing much better but has lost a good bit of weight.  He reports no significant headache, neck pain, or neck stiffness.  On my exam, lungs were clear and chest was nontender.  He does have a murmur.  Abdomen nontender.  Back nontender.  Good pulses in extremities.  Legs are nontender nonedematous.  Patient is slightly tachycardic and appears to show A-fib which he chronically has.  Clinically I am concerned that patient could have COVID-19 given the exposure.  Although he reports he had a home test negative several days ago we will retest with PCR.  Will also look for other viral testing.  With his diarrhea, nausea, vomiting, and all his weight loss in the setting of increased diuretics I am concerned he may have worsening kidney function or AKI.  He does have dry mucous membranes.  We will give a small amount of fluids and get workup.  With his chest pain that feels like a central pressure wrapping around towards his back, I am somewhat concerned we need to make sure is not a cardiac etiology of chest discomfort as it feels similar to prior.  We will get troponins and labs and anticipate discussion with cardiology after workup is completed.  He reports the pain was not stabbing or sharp to me and did not go straight through to his back, or soreness in the chest and back.  As his blood pressure is not critically elevated, have low suspicion for an aortic etiology of symptoms at this time.  Will hold on dissection study as I also anticipate his kidney function will likely be much worse.  Anticipate reassessment after workup.  Workup began to return.  Initial  troponin is 25, will trend.  BNP elevated at 550.  His urinalysis did not show infection his COVID and flu test are negative.  CBC shows no leukocytosis and mild anemia.  Metabolic panel shows consistently elevated creatinine.   The left chest pain going to the shoulder was short of breath nausea and lightheadedness, cardiology was called who will come see patient.  Anticipate follow-up on the recommendations.  His BNP was more elevated slightly.  Anticipate admission versus discharge based on cardiology recommendations.  I suspect he has a viral gastroenteritis causing nausea vomiting and looser stools in the setting of this discomfort.         Final Clinical Impression(s) / ED Diagnoses Final diagnoses:  Nausea vomiting and diarrhea  Chest pain, unspecified type    Clinical Impression: 1. Nausea vomiting and diarrhea   2. Chest pain, unspecified type     Disposition: Admit  This note was prepared with assistance of Dragon voice recognition software. Occasional wrong-word or sound-a-like substitutions may have occurred due to the inherent limitations of voice recognition software.     Richell Corker, Gwenyth Allegra, MD 12/06/22 [phone]

## 2022-12-06 NOTE — ED Triage Notes (Addendum)
Pt bib ems from home; Sunday night pt ate clam chowder, began vomiting shortly after, but endorses nausea prior to the ingestion of clam chowder; vomiting x 3 days; c/o cp that started today, has subsided on ems arrival; hx triple bypass; cp central, 2/10 , sharp, aching , radiating to back; recent med adjustment last week, increased fluid pill; endorses weight loss 18-20 lbs in past 3 weeks; no recent changes in diet; 18 ga LFA; 200 cc NS given PTA; BP 118/78, sinus arrhythmia 70-105; 97% RA, CBG 179; endorses recent contact with friend with covid

## 2022-12-06 NOTE — ED Provider Notes (Signed)
Care of patient assumed from Dr. Sherry Ruffing.  This patient with history of CAD presenting for anginal symptoms.  Initial troponin was 25.  Cardiology will evaluate. Physical Exam  BP 116/70   Pulse 95   Temp 97.8 F (36.6 C) (Oral)   Resp 17   Ht '5\' 7"'$  (1.702 m)   Wt 70.3 kg   SpO2 100%   BMI 24.28 kg/m   Physical Exam Vitals and nursing note reviewed.  Constitutional:      General: He is not in acute distress.    Appearance: He is well-developed. He is not ill-appearing, toxic-appearing or diaphoretic.  HENT:     Head: Normocephalic and atraumatic.  Eyes:     Conjunctiva/sclera: Conjunctivae normal.  Cardiovascular:     Rate and Rhythm: Normal rate and regular rhythm.  Pulmonary:     Effort: Pulmonary effort is normal.     Breath sounds: Normal breath sounds.  Abdominal:     Palpations: Abdomen is soft.  Musculoskeletal:        General: No swelling.     Cervical back: Normal range of motion and neck supple.  Skin:    General: Skin is warm and dry.  Neurological:     General: No focal deficit present.     Mental Status: He is alert and oriented to person, place, and time.  Psychiatric:        Mood and Affect: Mood normal.        Behavior: Behavior normal.     Procedures  Procedures  ED Course / MDM    Medical Decision Making Amount and/or Complexity of Data Reviewed Labs: ordered. Radiology: ordered.   Second troponin was 24.  On assessment, patient resting comfortably.  Cardiology evaluated and recommends discharge home with outpatient follow-up.  They did advise him to hold his Lasix for the next few days.  Patient does feel comfortable with this plan.  He was advised to return to the ED for any new or worsening symptoms.  He was discharged in stable condition.       Godfrey Pick, MD 12/06/22 [phone]

## 2022-12-08 ENCOUNTER — Ambulatory Visit: Payer: TRICARE For Life (TFL) | Admitting: Pharmacist

## 2022-12-08 ENCOUNTER — Other Ambulatory Visit: Payer: Self-pay | Admitting: Physician Assistant

## 2022-12-08 DIAGNOSIS — I480 Paroxysmal atrial fibrillation: Secondary | ICD-10-CM

## 2022-12-08 NOTE — Progress Notes (Signed)
Care Management & Coordination Services Pharmacy Note  12/08/2022 Name:  KEE DRUDGE MRN:  [id number] DOB:  [birth year]  Summary: Initial visit -HF: Pt was holding Lasix for 2 days following ED visit 1/23, he restarted this morning. He reports he is at his dry weight (155 lbs); reviewed HF medications and he reports compliance as prescribed -DM: A1c 8.0% (10/2022); reasonable control given age/comorbidities; fasting BG range 130-190 typically, he denies hypoglycemia -CKD stage 4: reviewed medication doses, CKD limits dose of allopurinol, Eliquis and precludes use of GDMT for HF (ARNI, MRA)  Recommendations/Changes made from today's visit: -No med changes; discussed importance of self monitoring (BP, HR, BG, weight, and for s/sx of bleeding)  Follow up plan: -Health Concierge will call patient 2 months for HF update -Pharmacist follow up televisit scheduled for 4 months -Cardiology appt 12/16/22, 03/01/23    Subjective: [name] is an 89 y.o. year old male who is a primary patient of Damita Dunnings, Elveria Rising, MD.  The care coordination team was consulted for assistance with disease management and care coordination needs.    Engaged with patient by telephone for initial visit. Patient lives at home alone. He has 2 children (son and daughter) and 2 grandchildren. He is a retired Dealer and gets medications through [employer] at reduced cost. He no longer drives, his children will drive him where he needs to go.  Recent office visits: 11/03/22 Dr Damita Dunnings OV: annual - A1c 8% reasonable goal. Discussed using lasix BID to help with SOB. Discussed gout trigger avoidance. F/u 3-4 mos.  Recent consult visits: 11/30/22 Dr Gwenlyn Found (Cardiology): f/u - recent ECHO showed worsened EF to 35-40%; given severe renal impairment, no invasive evaluation. Advised to drop hydralazine to once a day (BP 108/68)  11/22/22 NP Venia Carbon (Cardiology): f/u CAD - c/o chest pain. Rx Imdur 30 mg. Frequently forgets 2nd  dose of Lasix.  proBNP 6000 - will take 1-time dose of metolazone 30 min prior to lasix, repeat BNP and BMP 1/15.  Hospital visits: 12/06/22 ED visit [facility]): N/V/D, chest pain. Covid exposure 1 week prior. Workup wnl. Advised hold lasix for a few days, Resume Thurs 1/25. F/u 12/16/22.   Objective:  Lab Results  Component Value Date   CREATININE 3.32 (H) 12/06/2022   BUN 54 (H) 12/06/2022   GFR 18.94 (L) 10/27/2022   EGFR 15 (L) 11/28/2022   GFRNONAA 17 (L) 12/06/2022   GFRAA 24 (L) 07/30/2020   NA 131 (L) 12/06/2022   K 3.8 12/06/2022   CALCIUM 8.2 (L) 12/06/2022   CO2 26 12/06/2022   GLUCOSE 157 (H) 12/06/2022    Lab Results  Component Value Date/Time   HGBA1C 8.0 (H) 10/27/2022 08:54 AM   HGBA1C 9.3 (H) 10/29/2020 01:21 PM   HGBA1C 6.9 09/24/2015 12:00 AM   GFR 18.94 (L) 10/27/2022 08:54 AM   GFR 19.79 (L) 10/29/2020 01:21 PM   MICROALBUR 6.9 (H) 05/09/2018 11:24 AM   MICROALBUR <0.7 05/08/2017 02:53 PM    Last diabetic Eye exam:  Lab Results  Component Value Date/Time   HMDIABEYEEXA No Retinopathy 04/14/2016 12:00 AM    Last diabetic Foot exam:  Lab Results  Component Value Date/Time   HMDIABFOOTEX yes 10/21/2010 12:00 AM     Lab Results  Component Value Date   CHOL 115 10/27/2022   HDL 34.40 (L) 10/27/2022   LDLCALC 45 10/27/2022   TRIG 180.0 (H) 10/27/2022   CHOLHDL 3 10/27/2022       Latest Ref Rng &  Units 12/06/2022    1:15 PM 10/27/2022    8:54 AM 10/29/2020    1:21 PM  Hepatic Function  Total Protein 6.5 - 8.1 g/dL 6.5  6.9  7.8   Albumin 3.5 - 5.0 g/dL 3.5  4.1  4.0   AST 15 - 41 U/L 33  17  18   ALT 0 - 44 U/L '27  13  15   '$ Alk Phosphatase 38 - 126 U/L 67  73  81   Total Bilirubin 0.3 - 1.2 mg/dL 1.5  0.7  0.8     Lab Results  Component Value Date/Time   TSH 3.784 12/06/2022 01:30 PM   TSH 3.89 02/01/2019 11:24 AM   TSH 6.050 (H) 07/25/2018 12:18 PM       Latest Ref Rng & Units 12/06/2022    1:15 PM 10/27/2022    8:54 AM 09/14/2022    12:00 AM  CBC  WBC 4.0 - 10.5 K/uL 6.2  7.8    Hemoglobin 13.0 - 17.0 g/dL 12.2  12.3  13.6      Hematocrit 39.0 - 52.0 % 36.6  36.7    Platelets 150 - 400 K/uL 138  163.0       This result is from an external source.    Lab Results  Component Value Date/Time   VD25OH 57.2 09/14/2022 12:00 AM   VD25OH 50.6 02/27/2018 12:00 AM   VITAMINB12 263 09/13/2007 10:28 AM    Clinical ASCVD: Yes  The ASCVD Risk score (Arnett DK, et al., 2019) failed to calculate for the following reasons:   The 2019 ASCVD risk score is only valid for ages 52 to 37        11/01/2022   10:53 AM 03/15/2022   10:15 AM 10/28/2021   10:06 AM  Depression screen PHQ 2/9  Decreased Interest 0 0 0  Down, Depressed, Hopeless 0 0 0  PHQ - 2 Score 0 0 0     Social History   Tobacco Use  Smoking Status Former   Packs/day: 1.00   Years: 8.00   Total pack years: 8.00   Types: Cigarettes   Quit date: 1958   Years since quitting: 66.1  Smokeless Tobacco Never  Tobacco Comments   "quit smoking cigarettes in 1958"   BP Readings from Last 3 Encounters:  12/06/22 122/68  11/30/22 108/68  11/22/22 (!) 166/74   Pulse Readings from Last 3 Encounters:  12/06/22 (!) 55  11/30/22 63  11/22/22 67   Wt Readings from Last 3 Encounters:  12/06/22 155 lb (70.3 kg)  11/30/22 159 lb 6.4 oz (72.3 kg)  11/22/22 172 lb 6.4 oz (78.2 kg)   BMI Readings from Last 3 Encounters:  12/06/22 24.28 kg/m  11/30/22 24.97 kg/m  11/22/22 27.00 kg/m    No Known Allergies  Medications Reviewed Today     Reviewed by Dannielle Karvonen, RN (Registered Nurse) on 12/12/22 at 1139  Med List Status: <None>   Medication Order Taking? Sig Documenting Provider Last Dose Status Informant  allopurinol (ZYLOPRIM) 100 MG tablet [id number] No TAKE ONE-HALF (1/2) TABLET EVERY MONDAY, [facility] AND FRIDAY Tonia Ghent, MD Taking Active   amoxicillin (AMOXIL) 500 MG tablet [id number] No Take by mouth. Patient takes 4 tablets prior to  going to the dentist [provider] Taking Active   aspirin 81 MG tablet [id number] No Take 81 mg by mouth daily. [provider] Taking Active Self  BD INSULIN SYRINGE ULTRAFINE 31G X 5/16" 0.3  ML MISC [id number] No USE DAILY AS INSTRUCTED Tonia Ghent, MD Taking Active Self  Cholecalciferol (VITAMIN D) 1000 UNITS capsule [id number] No Take 1,000 Units by mouth daily.  [provider] Taking Active Self  diclofenac Sodium (VOLTAREN) 1 % GEL [id number] No Apply 2 g topically 4 (four) times daily. Loura Halt A, NP Taking Active   ELIQUIS 2.5 MG TABS tablet [id number] No TAKE 1 TABLET TWICE A DAY [name], MD Taking Active   empagliflozin (JARDIANCE) 10 MG TABS tablet [id number] No Take 1 tablet (10 mg total) by mouth daily before breakfast. PLEASE CALL THE OFFICE TO ARRANGE YOUR FOLLOW UP APPOINTMENT Bensimhon, Shaune Pascal, MD Taking Active   fish oil-omega-3 fatty acids 1000 MG capsule [id number] No Take 1 g by mouth daily. [provider] Taking Active Self  furosemide (LASIX) 80 MG tablet [id number] No TAKE 1 TABLET TWICE A DAY (DOSAGE CHANGE)  Patient taking differently: Take 80 mg by mouth daily. Patient states takes BID if needed for swelling   [name], MD Taking Active Self           Med Note Fleet Contras Nov 03, 2022  9:49 AM)    glucose blood (FREESTYLE LITE) test strip [id number] No USE TO TEST BLOOD SUGAR ONCE DAILY AND AS NEEDED. Dx E11.9 Tonia Ghent, MD Taking Active   hydrALAZINE (APRESOLINE) 25 MG tablet [id number] No Take 1 tablet (25 mg total) by mouth daily. [name], MD Taking Active   ipratropium (ATROVENT) 0.03 % nasal spray [id number] No USE 2 SPRAYS IN EACH NOSTRIL TWICE A DAY AS NEEDED FOR RHINITIS Tonia Ghent, MD Taking Active   isosorbide mononitrate (IMDUR) 30 MG 24 hr tablet [id number] No Take 1 tablet (30 mg total) by mouth daily. Trudi Ida, NP Taking Active   LANTUS 100 UNIT/ML injection  [id number] No INJECT 20 TO 25 UNITS AT BEDTIME Tonia Ghent, MD Taking Active   magnesium oxide (MAG-OX) 400 MG tablet [id number] No Take 400 mg by mouth daily. [provider] Taking Active Self  metoprolol tartrate (LOPRESSOR) 50 MG tablet [id number] No TAKE 1 TABLET(50 MG) BY MOUTH TWICE DAILY [name], MD Taking Active   Multiple Vitamin (MULTIVITAMIN WITH MINERALS) TABS [id number] No Take 1 tablet by mouth daily. [provider] Taking Active Self  nitroGLYCERIN (NITROSTAT) 0.4 MG SL tablet [id number] No DISSOLVE 1 TABLET UNDER THE TONGUE EVERY 5 MINUTES AS NEEDED FOR CHEST PAIN FOR A MAXIMUM OF 3 DOSES [name], MD Taking Active   polyethylene glycol (MIRALAX / GLYCOLAX) 17 g packet [id number]  Take 17 g by mouth daily as needed for mild constipation. [provider]  Active Self  Potassium Gluconate 595 MG CAPS [id number] No Take 595 mg by mouth daily. [provider] Taking Active Self  ranolazine (RANEXA) 500 MG 12 hr tablet [id number] No TAKE 1 TABLET TWICE A DAY [name], MD Taking Active   rosuvastatin (CRESTOR) 40 MG tablet [id number] No TAKE 1 TABLET AT BEDTIME [name], MD Taking Active   traMADol (ULTRAM) 50 MG tablet [id number] No TAKE 1 TABLET EVERY 6 HOURS AS NEEDED Tonia Ghent, MD Taking Active   valACYclovir (VALTREX) 1000 MG tablet [id number] No Take by mouth. [provider] Taking Active             SDOH:  (Social Determinants of Health) assessments and interventions performed: No SDOH Interventions  Flowsheet Row Clinical Support from 11/01/2022 in [place] [facility] [place] [facility] from 03/15/2022 in [place] [facility] [place] from 06/21/2019 in [place] at [place] Interventions Intervention Not Indicated Intervention Not Indicated --  Housing Interventions  Intervention Not Indicated Intervention Not Indicated --  Transportation Interventions Intervention Not Indicated Intervention Not Indicated --  Depression Interventions/Treatment  -- -- UJW1-1 Score <4 Follow-up Not Indicated  Financial Strain Interventions Intervention Not Indicated -- --  Physical Activity Interventions Intervention Not Indicated -- --  Stress Interventions Intervention Not Indicated -- --  Social Connections Interventions Intervention Not Indicated -- --       Medication Assistance: None required.  Patient affirms current coverage meets needs.  Medication Access: Within the past 30 days, how often has patient missed a dose of medication? 0 Is a pillbox or other method used to improve adherence? Yes  Factors that may affect medication adherence? no barriers identified Are meds synced by current pharmacy? No  Are meds delivered by current pharmacy? Yes  Does patient experience delays in picking up medications due to transportation concerns? No   [facility] Services Reviewed: Is patient disadvantaged to use [facility]?: Yes  Current Rx insurance plan: T[employer] Name and location of Current pharmacy:  [facility] [phone] - [place], [place]  [address] Phone: [phone] Fax: [phone]  [facility] [place] [address] [place] [address] Phone: [phone] Fax: [phone]  [facility] services reviewed with patient today?: No  Patient requests to transfer care to [facility]?: No  Reason patient declined to change pharmacies: Disadvantaged due to insurance/mail order  Compliance/Adherence/Medication fill history: Care Gaps: Eye exam (due 03/2019)  Star-Rating Drugs: Jardiance - PDC 100% Rosuvastatin - PDC 99%   Assessment/Plan  Heart Failure (Goal: BP < 130/80) -Controlled -  furosemide was on hold for last couple days, he started back this morning and reports wt is stable at his dry weight -Current home BP/HR readings:  122/68; P 66 -Current home daily weights: 155# today -Last ejection fraction: 35-40% (Date: 11/28/22) -HF type: HFrEF (EF < 40%) -NYHA Class: III (marked limitation of activity) -AHA HF Stage: C (Heart disease and symptoms present) -Current treatment: Furosemide 80 mg daily - Appropriate, Effective, Safe, Accessible Metoprolol tartrate 50 mg BID - Appropriate, Effective, Safe, Accessible Hydralazine 25 mg daily AM - Appropriate, Effective, Safe, Accessible Isosorbide MN 30 mg daily - Appropriate, Effective, Safe, Accessible Jardiance 10 mg daily - Appropriate, Effective, Safe, Accessible Potassium gluconate 595 mg daily - Appropriate, Effective, Safe, Accessible Magnesium 400 mg daily -Appropriate, Effective, Safe, Accessible -Medications previously tried: n/a  -Educated on Benefits of medications for managing symptoms and prolonging life Importance of weighing daily; if you gain more than 3 pounds in one day or 5 pounds in one week, contact cardiology;Proper diuretic administration and potassium supplementation -Reviewed GDMT: not a candidate for MRA or ARNI due to [place] to monitor BP at home daily -Recommended to continue current medication  Hyperlipidemia / CAD (LDL goal < 70) -Controlled - LDL 45 (10/2022) at goal -Pt reports "chest pain" in the past 2 months has been more in his back, it is not the same as the anginal symptoms he used to have -Hx  CABG 1996, 2006. -Current treatment: Rosuvastatin 40 mg daily HS - Appropriate, Effective, Safe, Accessible Ranexa 500 mg BID - Appropriate, Effective, Safe, Accessible Isosorbide MN 30 mg daily - Appropriate, Effective, Safe, Accessible Nitroglycerin 0.4 mg SL PRN - used 4x in past 2 months Aspirin 81 mg daily - Appropriate, Effective, Safe, Accessible Fish oil 1000 mg daily -  Appropriate, Effective, Safe, Accessible -Medications previously tried: Plavix, Brilinta -Educated on Cholesterol goals; benefits of statin; benefits of Ranexa and nitrates for anginal symptoms -Discussed refilling NTG every 6 months -Reviewed rosuvastatin dose - maximum dose for GFR < 30 is 10 mg, however pt has been on 40 mg since 2019 and has had GFR < 30 since ~2016, benefits outweigh risks at this point and it is reasonable to continue 40 mg dose -Recommended to continue current medication  Diabetes (A1c goal <8%) -Controlled - A1c 8.0% (10/2022) -Current home glucose readings fasting glucose: 114 today; usually 130-200 -Denies hypoglycemic/hyperglycemic symptoms -Pt reports he reduced calories by 1/2 over the past year, has lost 10-12 lbs -Current medications: Jardiance 10 mg daily - Appropriate, Effective, Safe, Accessible Lantus vial 20-25 units HS  - Appropriate, Effective, Safe, Accessible Test strips -Medications previously tried: n/a  -Current meal patterns:  dinner: black [place] ham, swiss cheese, yogurt, cheese crackers  -Current exercise: limited -Educated on A1c and blood sugar goals;Benefits of weight loss; -Reviewed diet changes to reduce carbs/sweets -Recommended to continue current medication  Atrial Fibrillation (Goal: prevent stroke and major bleeding) -Controlled - follows with cardiology -CHADSVASC: 5 -Hx GI bleed 2016;  -Current treatment: Eliquis 2.5 mg BID - Appropriate, Effective, Safe, Accessible Metoprolol tartrate 50 mg BID - Appropriate, Effective, Safe, Accessible -Medications previously tried: n/a -Counseled on increased risk of stroke due to Afib and benefits of anticoagulation for stroke prevention; bleeding risk associated with Eliquis and importance of self-monitoring for signs/symptoms of bleeding; avoidance of NSAIDs due to increased bleeding risk with anticoagulants; seeking medical attention after a head injury or if there is blood in the  urine/stool; -Recommended to continue current medication  Gout (Goal: Prevent gout flares) -Not ideally controlled - uric acid 7.0 (10/2022) above goal of <6, but CKD limits allopurinol dose -Last Gout Flare: last month (10/2022) -Pt reports beef is a trigger - he tries to avoid this -Current treatment  Allopurinol 100 mg - 1/2 tab MWF - Appropriate, Query Effective -Medications previously tried: n/a  - Counseled patient on low purine diet plan. Counseled patient to reduce consumption of high-fructose corn syrup, sweetened soft drinks, fruit juices, meat, and seafood. -Recommended to continue current medication  Chronic Kidney Disease Stage 4  -All medications assessed for renal dosing and appropriateness in chronic kidney disease. -Recommended to continue current medication  Health Maintenance -Vaccine gaps: Shingrix -Discussed Shingrix and RSV vaccine -MISC meds Valacyclovir 1000 mg - Appropriate, Effective, Safe, Accessible Tramadol 50 mg -Appropriate, Effective, Safe, Accessible Vitamin D 1000 IU -Appropriate, Effective, Safe, Accessible Multivitamin- Appropriate, Effective, Safe, Accessible Atrovent nasal spray - up to QID -Appropriate, Effective, Safe, Accessible Voltaren gel -Appropriate, Effective, Safe, Accessible Amoxicillin (dentist) -Appropriate, Effective, Safe, Accessible -Patient is satisfied with current therapy and denies issues -Recommended to continue current medication  Charlene Brooke, PharmD, BCACP Clinical Pharmacist [facility] at [facility] [phone]

## 2022-12-08 NOTE — Telephone Encounter (Signed)
Prescription refill request for Eliquis received. Indication: Afib  Last office visit: 11/30/22 Gwenlyn Found)  Scr: 3.32 (12/06/22)  Age: 89 Weight: 70.3kg  Appropriate dose and refill sent to requested pharmacy.

## 2022-12-09 ENCOUNTER — Telehealth: Payer: Self-pay

## 2022-12-09 NOTE — Patient Outreach (Signed)
  Care Coordination TOC Note Transition Care Management Follow-up Telephone Call Date of discharge and from where: 12/06/22-[place]   Dx: "n&v, chest pain" Red on EMMI-ED Discharge Alert Reason: "Scheduled follow-up appt? No" Red Alert Date: 12/08/22 How have you been since you were released from the hospital? Patient states he "doing okay-don't have as much strength an energy as I used to have." He voices that he has to rest frequently when performing household duties. Denies any SOB or chest pain since returning home. He held his Lasix for two days as advised by ED MD and has since resumed it. He voices " a little puffiness in legs today." Wgt this am was 159 lbs. He is aware of s/s of worsening condition and when to seek medical attention. Patient reports GI sxs have resolved. He was having diarrhea prior to ED visit but now having some constipation. He will resume taking his Miralax today.  Any questions or concerns? No  Items Reviewed: Did the pt receive and understand the discharge instructions provided? Yes  Medications obtained and verified? Yes  Other? No  Any new allergies since your discharge? No  Dietary orders reviewed? Yes Do you have support at home?  Lives alone but family nearby and checks on him  Home Care and Equipment/Supplies: Were home health services ordered? not applicable If so, what is the name of the agency? N/A  Has the agency set up a time to come to the patient's home? not applicable Were any new equipment or medical supplies ordered?  No What is the name of the medical supply agency? N/A Were you able to get the supplies/equipment? not applicable Do you have any questions related to the use of the equipment or supplies? No  Functional Questionnaire: (I = Independent and D = Dependent) ADLs: I  Bathing/Dressing- I  Meal Prep- I  Eating- I  Maintaining continence- I  Transferring/Ambulation- I  Managing Meds- I  Follow up appointments  reviewed:  PCP Hospital f/u appt confirmed? No   Specialist Hospital f/u appt confirmed? Yes  Scheduled to see Annamaria Boots on 12/16/22 @ 10:05 am. Are transportation arrangements needed? No  If their condition worsens, is the pt aware to call PCP or go to the Emergency Dept.? Yes Was the patient provided with contact information for the PCP's office or ED? Yes Was to pt encouraged to call back with questions or concerns? Yes  SDOH assessments and interventions completed:   Yes SDOH Interventions Today    Flowsheet Row Most Recent Value  SDOH Interventions   Food Insecurity Interventions Intervention Not Indicated  Transportation Interventions Intervention Not Indicated       Care Coordination Interventions:   Interventions Today    Flowsheet Row Most Recent Value  Chronic Disease Discussed/Reviewed   Chronic disease discussed/reviewed during today's visit Congestive Heart Failure (CHF)  Education Interventions   Education Provided Provided Verbal Education  [HF mgmt, wgt mgmt, diuretic usage, s/s of when to seek medical attention]  Provided Verbal Education On Nutrition, Medication  [low salt, heart healthy diet]  Nutrition Interventions   Nutrition Discussed/Reviewed Nutrition Discussed, Decreasing salt         Encounter Outcome:  Pt. Visit Completed    Enzo Montgomery, RN,[name],CCM [place] [facility] Telephonic Care Management Coordinator Direct Phone: [phone] Toll Free: [phone] Fax: [phone]

## 2022-12-12 ENCOUNTER — Ambulatory Visit: Payer: Self-pay

## 2022-12-12 NOTE — Patient Outreach (Signed)
  Care Coordination   Follow Up Visit Note   12/12/2022 Name: [name] MRN: [id number] DOB: 01/03/1932  [name] is a 89 y.o. year old male who sees Tonia Ghent, MD for primary care. I spoke with  [name] by phone today.  What matters to the patients health and wellness today?  Patient states he understands and is able to manage his heart failure. He agrees that his goal has been met and will therefore close out to care coordination follow up.     Goals Addressed             This Visit's Progress    COMPLETED: Development of plan of care for CAD/ Heart failure       Care Coordination Interventions: Evaluation of current treatment plan related to CAD/ Heart failure and patient's adherence to plan as established by provider:  Patient reports having ED visit on 12/06/22 due to a virus. He states he all symptoms have resolved and he is doing much better.  Patient confirms he is back on his fluid pill.  Patient states he is back to baseline regarding the swelling in his legs.  Patient reports his current weight at 157.2 lbs.  He denies any shortness of breath or chest discomfort.  Patient reports recent blood pressure ws 136/67.  Reviewed medications with patient and discussed importance of medication adherence:   Patient advised to continue to weight daily and record and adhere to a low salt diet. :Advised to notify provider for a weight gain of 3 lbs overnight or 5 lbs in a weight or worsening symptoms.  Call 911 for severe symptoms.    Reviewed scheduled/upcoming provider appointments Discussed plans with patient for ongoing care management follow up:  Patient states he understands and is able to manage his CAD/ heart failure.  He states he does not require further care coordination assistance.             SDOH assessments and interventions completed:  No     Care Coordination Interventions:  Yes, provided   Follow up plan: No further intervention required.    Encounter Outcome:  Pt. Visit Completed   [name] RN,[name],CCM [place] [phone] direct line

## 2022-12-14 NOTE — Progress Notes (Signed)
Cardiology Clinic Note   Patient Name: [name] Date of Encounter: 12/16/2022  Primary Care Provider:  Tonia Ghent, MD Primary Cardiologist:  Quay Burow, MD  Patient Profile    [name] 89 year old male presents clinic today for follow-up evaluation of his coronary artery disease and essential hypertension.  Past Medical History    Past Medical History:  Diagnosis Date   Age-related macular degeneration, wet, both eyes ([place])    Anemia    Secondary to acute blood loss   Anginal pain ([place])    last chest pain in Feb 2021   Arthritis    "mild in hands, knees, ankles" (11/13/2015)   Atrial fibrillation ([place])    Consideration was given for atrial flutter ablation, but patient developed atrial fibrillation. Cardioversion was done. Dr. Caryl Comes decided to watch him clinically. November, 2011   Atrial flutter [facility]) 07/2010   September, 2011   Hospital with PNA and cath done.Marland KitchenMarland KitchenCoumadin.  Atrial flutter ablation planned, but  pt. then had atrial fibrillation,/outpatient conversion 09/08/10..NSR..plan to follow..Dr. Caryl Comes   CAD (coronary artery disease)    Catheterization, September, 2011,  grafts patent from redo CABG,, medical therapy of coronary disease, consideration to proceeding with atrial flutter ablation   Carotid artery disease ([place])    Doppler 09/18/2009 - 49% bilateral stenoses   Carotid artery disease ([name])    49% bilateral, Doppler, November, 2010   CHF (congestive heart failure) ([place])    Chronic kidney disease (CKD), stage III (moderate) ([place])    Diabetic peripheral neuropathy ([place])    feet   Diverticulosis of colon with hemorrhage 2009   several unit diverticular bleed    Erectile dysfunction    Mild   Gout    Hearing loss    wears hearing aids   History of blood transfusion "several"   related to diverticular bleeding   Hyperlipidemia    Hypertension    Mitral regurgitation    Mild, echo, September, 2011   NSTEMI (non-ST elevated myocardial  infarction) ([place]) 07/2010   at [facility] with repeat cath, rec medical mgmt    OSA on CPAP    uses CPAP nightly   Personal history of colonic polyps    PNA (pneumonia) 9/11   NSTEMI at [facility] with repeat cath, rec medical mgmt    RBBB (right bundle branch block)    S/P mitral valve repair 02/06/2020   s/p TEER with a single MitraClip NTW placed on A2/P2   Shoulder pain    "positional; better now" (11/13/2015)   Skin cancer    R lower leg, per derm 2012   Type II diabetes mellitus ([place])    Past Surgical History:  Procedure Laterality Date   CARDIAC CATHETERIZATION  2006   Nuclear..slight lateral ischemia..medical therapy   CARDIAC CATHETERIZATION  08/04/2010   grafts patent from redo CABG...medical Rx and ablate Atrial flutter (LV not injected)    CARDIOVERSION  ~ 2010   CAROTID ENDARTERECTOMY Right 1994   CATARACT EXTRACTION W/ INTRAOCULAR LENS  IMPLANT, BILATERAL Bilateral    COLON RESECTION N/A 01/13/2016   Procedure: EXPLORATORY LAPAROTOMY, LEFT AND SIGMOID COLON REMOVAL;  Surgeon: Ralene Ok, MD;  Location: [place];  Service: General;  Laterality: N/A;  Extended open left hemicolectomy and sigmoidectomy    COLONOSCOPY N/A 11/14/2015   Procedure: COLONOSCOPY;  Surgeon: Manus Gunning, MD;  Location: [place];  Service: Gastroenterology;  Laterality: N/A;   COLONOSCOPY Left 01/05/2016   Procedure: COLONOSCOPY;  Surgeon: Renelda Loma  Havery Moros, MD;  Location: [place];  Service: Gastroenterology;  Laterality: Left;  no sedation to start, moderate if needed   COLONOSCOPY W/ POLYPECTOMY     CORONARY ARTERY BYPASS GRAFT  1995; 2006   "X 3; X3"   CORONARY ARTERY BYPASS GRAFT  1995, 2006   DOPPLER ECHOCARDIOGRAPHY  08/2008   EF 60%   DOPPLER ECHOCARDIOGRAPHY  08/02/2010   65-70%   DOPPLER ECHOCARDIOGRAPHY  07/2010   MR mild   ESOPHAGOGASTRODUODENOSCOPY N/A 06/19/2014   Procedure: ESOPHAGOGASTRODUODENOSCOPY (EGD);  Surgeon: Jerene Bears, MD;  Location[facility] ENDOSCOPY;  Service:  Endoscopy;  Laterality: N/A;   ESOPHAGOGASTRODUODENOSCOPY N/A 11/14/2015   Procedure: ESOPHAGOGASTRODUODENOSCOPY (EGD);  Surgeon: Manus Gunning, MD;  Location: [place];  Service: Gastroenterology;  Laterality: N/A;   FLEXIBLE SIGMOIDOSCOPY N/A 11/16/2015   Procedure: FLEXIBLE SIGMOIDOSCOPY;  Surgeon: Manus Gunning, MD;  Location: [facility] ENDOSCOPY;  Service: Gastroenterology;  Laterality: N/A;   LAPAROSCOPIC CHOLECYSTECTOMY  2008   LEFT HEART CATH N/A 06/14/2014   Procedure: LEFT HEART CATH;  Surgeon: Sinclair Grooms, MD;  Location: [facility] CATH LAB;  Service: Cardiovascular;  Laterality: N/A;   LEFT HEART CATH AND CORONARY ANGIOGRAPHY N/A 08/02/2018   Procedure: LEFT HEART CATH AND CORONARY ANGIOGRAPHY;  Surgeon: [name], MD;  Location: [place] CV LAB;  Service: Cardiovascular;  Laterality: N/A;   LEFT HEART CATHETERIZATION WITH CORONARY/GRAFT ANGIOGRAM N/A 06/11/2014   Procedure: LEFT HEART CATHETERIZATION WITH Beatrix Fetters;  Surgeon: Sinclair Grooms, MD;  Location: [facility] CATH LAB;  Service: Cardiovascular;  Laterality: N/A;   MITRAL VALVE REPAIR N/A 02/06/2020   Procedure: MITRAL VALVE REPAIR;  Surgeon: Sherren Mocha, MD;  Location: [place] CV LAB;  Service: Cardiovascular;  Laterality: N/A;   PERCUTANEOUS CORONARY STENT INTERVENTION (PCI-S) N/A 06/13/2014   Procedure: PERCUTANEOUS CORONARY STENT INTERVENTION (PCI-S);  Surgeon: Sinclair Grooms, MD;  Location: [facility] CATH LAB;  Service: Cardiovascular;  Laterality: N/A;   RIGHT HEART CATH N/A 01/06/2020   Procedure: RIGHT HEART CATH;  Surgeon: Jolaine Artist, MD;  Location: [place] CV LAB;  Service: Cardiovascular;  Laterality: N/A;   SKIN CANCER EXCISION Left 10/2015   calf   SKIN CANCER EXCISION Right 2014?   chest   TEE WITHOUT CARDIOVERSION N/A 01/06/2020   Procedure: TRANSESOPHAGEAL ECHOCARDIOGRAM (TEE);  Surgeon: Jolaine Artist, MD;  Location: [facility] ENDOSCOPY;  Service: Cardiovascular;   Laterality: N/A;   TONSILLECTOMY     VASECTOMY      Allergies  No Known Allergies  History of Present Illness    BIRDIE BEVERIDGE coronary artery disease status post CABG 1995 with redo 2006.  Early ISR (Plavix nonresponder), NSTEMI by troponin echocardiogram 1/17-medical management recommended, NSTEMI 3/17 status post colectomy-medical management recommended.  His PMH also includes HTN, RBBB, acute on chronic diastolic CHF, PAF, mitral insufficiency, GERD, type 2 diabetes, chronic renal insufficiency stage IV, lower GI bleed, HLD, and gout.  He was seen in follow-up by Richardson Dopp, PA-C in early January.  He was started on Imdur at that time.  He followed up with Dr.Berry 11/30/2022.  During that time he reported that his chest discomfort had improved.  Of note it has been 4 years since his previous cardiac catheterization and he has elevated creatinine in the 3.7 range.  It was felt that repeat catheterization would not be advisable.  Medical management was recommended.  He presented to the emergency department on 12/06/2022.  His blood pressure was noted to be  116/74 with a pulse of 95.  His EKG showed atrial fibrillation RBBB, LVH.  He reported that he had been exposed to someone with COVID.  He had felt nauseous for several days.  He reported vomiting that had been waxing and waning.  He also reported loose stools and abdominal pain.  He had been having chest discomfort for the previous 2 days.  He indicated that his symptoms felt similar to his previous MI but not quite as severe.  Cardiology was consulted and we recommended holding Lasix.  His second troponin was noted to be 24.  He was advised to return to the emergency department if his symptoms worsen or he develop new symptoms.  He was discharged in stable condition.  He presents to the clinic today for follow-up evaluation and states he is feeling well today.  He does note some increased work of breathing with increased physical activity.   He has normal breathing at rest.  He has returned to his daily activities.  His weight has remained stable.  He reports that he weighs daily and record several times per week.  He has been compliant with his medications.  I will give him the salty 6 diet she, have him increase his physical activity as tolerated and plan follow-up in 3 to 4 months.  Today he denies chest pain, shortness of breath, lower extremity edema, fatigue, palpitations, melena, hematuria, hemoptysis, diaphoresis, weakness, presyncope, syncope, orthopnea, and PND.   Home Medications    Prior to Admission medications   Medication Sig Start Date End Date Taking? Authorizing Provider  allopurinol (ZYLOPRIM) 100 MG tablet TAKE ONE-HALF (1/2) TABLET EVERY MONDAY, [facility] AND FRIDAY 10/17/22   Tonia Ghent, MD  amoxicillin (AMOXIL) 500 MG tablet Take by mouth. Patient takes 4 tablets prior to going to the dentist 12/28/21   [provider]  aspirin 81 MG tablet Take 81 mg by mouth daily.    [provider]  BD INSULIN SYRINGE ULTRAFINE 31G X 5/16" 0.3 ML MISC USE DAILY AS INSTRUCTED 12/02/15   Tonia Ghent, MD  Cholecalciferol (VITAMIN D) 1000 UNITS capsule Take 1,000 Units by mouth daily.     [provider]  diclofenac Sodium (VOLTAREN) 1 % GEL Apply 2 g topically 4 (four) times daily. 07/20/20   Orvan July, NP  ELIQUIS 2.5 MG TABS tablet TAKE 1 TABLET TWICE A DAY 12/08/22   [name], MD  empagliflozin (JARDIANCE) 10 MG TABS tablet Take 1 tablet (10 mg total) by mouth daily before breakfast. PLEASE CALL THE OFFICE TO ARRANGE YOUR FOLLOW UP APPOINTMENT 07/07/22   Bensimhon, Shaune Pascal, MD  fish oil-omega-3 fatty acids 1000 MG capsule Take 1 g by mouth daily.    [provider]  furosemide (LASIX) 80 MG tablet TAKE 1 TABLET TWICE A DAY (DOSAGE CHANGE) Patient taking differently: Take 80 mg by mouth daily. Patient states takes BID if needed for swelling 11/04/21   [name], MD   glucose blood (FREESTYLE LITE) test strip USE TO TEST BLOOD SUGAR ONCE DAILY AND AS NEEDED. Dx E11.9 08/21/22   Tonia Ghent, MD  hydrALAZINE (APRESOLINE) 25 MG tablet Take 1 tablet (25 mg total) by mouth daily. 12/02/22   [name], MD  ipratropium (ATROVENT) 0.03 % nasal spray USE 2 SPRAYS IN EACH NOSTRIL TWICE A DAY AS NEEDED FOR RHINITIS 12/15/21   Tonia Ghent, MD  isosorbide mononitrate (IMDUR) 30 MG 24 hr tablet Take 1 tablet (30  mg total) by mouth daily. 11/22/22   Trudi Ida, NP  LANTUS 100 UNIT/ML injection INJECT 20 TO 25 UNITS AT BEDTIME 08/15/22   Tonia Ghent, MD  magnesium oxide (MAG-OX) 400 MG tablet Take 400 mg by mouth daily.    [provider]  metoprolol tartrate (LOPRESSOR) 50 MG tablet TAKE 1 TABLET(50 MG) BY MOUTH TWICE DAILY 10/11/22   [name], MD  Multiple Vitamin (MULTIVITAMIN WITH MINERALS) TABS Take 1 tablet by mouth daily.    [provider]  nitroGLYCERIN (NITROSTAT) 0.4 MG SL tablet DISSOLVE 1 TABLET UNDER THE TONGUE EVERY 5 MINUTES AS NEEDED FOR CHEST PAIN FOR A MAXIMUM OF 3 DOSES 07/01/22   [name], MD  polyethylene glycol (MIRALAX / GLYCOLAX) 17 g packet Take 17 g by mouth daily as needed for mild constipation.    [provider]  Potassium Gluconate 595 MG CAPS Take 595 mg by mouth daily.    [provider]  ranolazine (RANEXA) 500 MG 12 hr tablet TAKE 1 TABLET TWICE A DAY 03/14/22   [name], MD  rosuvastatin (CRESTOR) 40 MG tablet TAKE 1 TABLET AT BEDTIME 09/19/22   [name], MD  traMADol (ULTRAM) 50 MG tablet TAKE 1 TABLET EVERY 6 HOURS AS NEEDED 08/31/22   Tonia Ghent, MD  valACYclovir (VALTREX) 1000 MG tablet Take by mouth. 08/30/22   [provider]    Family History    Family History  Problem Relation Age of Onset   Kidney disease Mother        Kidney failure   Stroke Mother    Diabetes Mother    Heart disease Father        MI   Arthritis  Sister    Cancer Sister        Throat   Heart disease Sister        MI   Diabetes Sister    Prostate cancer Neg Hx    Colon cancer Neg Hx    He indicated that his mother is deceased. He indicated that his father is deceased. He indicated that both of his sisters are alive. He indicated that the status of his neg hx is unknown.  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     Lives with girlfriend [name].  Widowed 12/2005.   Social Determinants of Health   Financial Resource Strain: Low Risk  (  11/01/2022)   Overall Financial Resource Strain (CARDIA)    Difficulty of Paying Living Expenses: Not hard at all  Food Insecurity: No Food Insecurity (12/09/2022)   Hunger Vital Sign    Worried About Running Out of Food in the Last Year: Never true    Ran Out of Food in the Last Year: Never true  Transportation Needs: No Transportation Needs (12/09/2022)   PRAPARE - Hydrologist (Medical): No    Lack of Transportation (Non-Medical): No  Physical Activity: Inactive (11/01/2022)   Exercise Vital Sign    Days of Exercise per  Week: 0 days    Minutes of Exercise per Session: 0 min  Stress: No Stress Concern Present (11/01/2022)   [place]    Feeling of Stress : Not at all  Social Connections: Moderately Isolated (11/01/2022)   Social Connection and Isolation Panel [NHANES]    Frequency of Communication with Friends and Family: More than three times a week    Frequency of Social Gatherings with Friends and Family: More than three times a week    Attends Religious Services: Never    Marine scientist or Organizations: Yes    Attends Archivist Meetings: 1 to 4 times per year    Marital Status: Widowed  Intimate Partner Violence: Not At Risk (11/01/2022)   Humiliation, Afraid, Rape, and Kick questionnaire    Fear of Current or Ex-Partner: No    Emotionally Abused: No    Physically Abused: No    Sexually Abused: No     Review of Systems    General:  No chills, fever, night sweats or weight changes.  Cardiovascular:  No chest pain, dyspnea on exertion, edema, orthopnea, palpitations, paroxysmal nocturnal dyspnea. Dermatological: No rash, lesions/masses Respiratory: No cough, dyspnea Urologic: No hematuria, dysuria Abdominal:   No nausea, vomiting, diarrhea, bright red blood per rectum, melena, or hematemesis Neurologic:  No visual changes, wkns, changes in mental status. All other systems reviewed and are otherwise negative except as noted above.  Physical Exam    VS:  BP 138/62   Pulse 70   Ht '5\' 7"'$  (1.702 m)   Wt 165 lb 6.4 oz (75 kg)   SpO2 98%   BMI 25.91 kg/m  , BMI Body mass index is 25.91 kg/m. GEN: Well nourished, well developed, in no acute distress. HEENT: normal. Neck: Supple, no JVD, carotid bruits, or masses. Cardiac: RRR, systolic 3/6 heard along left sternal border and apex murmur, rubs, or gallops. No clubbing, cyanosis, ankle edema.  Radials/DP/PT 2+ and equal bilaterally.  Respiratory:  Respirations  regular and unlabored, clear to auscultation bilaterally. GI: Soft, nontender, nondistended, BS + x 4. MS: no deformity or atrophy. Skin: warm and dry, no rash. Neuro:  Strength and sensation are intact. Psych: Normal affect.  Accessory Clinical Findings    Recent Labs: 11/28/2022: NT-Pro BNP 6,654 12/06/2022: ALT 27; B Natriuretic Peptide 550.8; BUN 54; Creatinine, Ser 3.32; Hemoglobin 12.2; Platelets 138; Potassium 3.8; Sodium 131; TSH 3.784   Recent Lipid Panel    Component Value Date/Time   CHOL 115 10/27/2022 0854   TRIG 180.0 (H) 10/27/2022 0854   HDL 34.40 (L) 10/27/2022 0854   CHOLHDL 3 10/27/2022 0854   VLDL 36.0 10/27/2022 0854   LDLCALC 45 10/27/2022 0854         ECG personally reviewed by me today-none today.  Echocardiogram 11/24/2022  IMPRESSIONS  1. Left ventricular ejection fraction, by estimation, is 35 to 40%. The  left ventricle has moderately decreased function. The left ventricle  demonstrates regional wall motion abnormalities (inferior, inferoseptal  and basal to mid septal wall  hypokinesis). There is mild to moderate left ventricular hypertrophy. Left  ventricular diastolic parameters are indeterminate.   2. Right ventricular systolic function is mildly reduced. The right  ventricular size is moderately enlarged. There is severely elevated  pulmonary artery systolic pressure. The estimated right ventricular  systolic pressure is 46.2 mmHg.   3. Left atrial size was moderately dilated.   4. The mitral valve is normal in structure. Mild mitral valve  regurgitation. No evidence of mitral stenosis. There is a Mitra-Clip  present in the mitral position. Procedure Date: 02/06/20.   5. Tricuspid valve regurgitation is moderate.   6. The aortic valve is tricuspid. There is moderate calcification of the  aortic valve. Aortic valve regurgitation is not visualized. No aortic  stenosis is present. Aortic valve area, by VTI measures 0.87 cm. Aortic   valve mean gradient measures 8.2  mmHg. Aortic valve Vmax measures 1.94 m/s.   7. The inferior vena cava is normal in size with greater than 50%  respiratory variability, suggesting right atrial pressure of 3 mmHg.   8. There is a small atrial septal defect with predominantly left to right  shunting across the atrial septum.   FINDINGS   Left Ventricle: Left ventricular ejection fraction, by estimation, is 35  to 40%. The left ventricle has moderately decreased function. The left  ventricle demonstrates regional wall motion abnormalities. Definity  contrast agent was given IV to delineate  the left ventricular endocardial borders. The left ventricular internal  cavity size was normal in size. There is moderate left ventricular  hypertrophy. Left ventricular diastolic parameters are indeterminate.   Right Ventricle: The right ventricular size is moderately enlarged. No  increase in right ventricular wall thickness. Right ventricular systolic  function is mildly reduced. There is severely elevated pulmonary artery  systolic pressure. The tricuspid  regurgitant velocity is 3.73 m/s, and with an assumed right atrial  pressure of 5 mmHg, the estimated right ventricular systolic pressure is  70.3 mmHg.   Left Atrium: Left atrial size was moderately dilated.   Right Atrium: Right atrial size was normal in size.   Pericardium: There is no evidence of pericardial effusion.   Mitral Valve: The mitral valve is normal in structure. Mild mitral valve  regurgitation. There is a Mitra-Clip present in the mitral position.  Procedure Date: 02/06/20. No evidence of mitral valve stenosis. MV peak  gradient, 14.2 mmHg. The mean mitral  valve gradient is 4.0 mmHg.   Tricuspid Valve: The tricuspid valve is normal in structure. Tricuspid  valve regurgitation is moderate . No evidence of tricuspid stenosis.   Aortic Valve: The aortic valve is tricuspid. There is moderate  calcification of the aortic  valve. Aortic valve regurgitation is not  visualized. No aortic stenosis is present. Aortic valve mean gradient  measures 8.2 mmHg. Aortic valve peak gradient  measures 15.1 mmHg. Aortic valve area, by VTI measures 0.87 cm.   Pulmonic Valve: The pulmonic valve was normal in structure. Pulmonic valve  regurgitation is not visualized. No evidence of pulmonic stenosis.   Aorta: The aortic root is normal in size and structure.   Venous: The inferior vena cava is normal in size with greater than 50%  respiratory variability, suggesting right atrial pressure of 3 mmHg.   IAS/Shunts:  No atrial level shunt detected by color flow Doppler. There is  a small atrial septal defect with predominantly left to right shunting  across the atrial septum.       Assessment & Plan   1.  Coronary artery disease-no chest pain today.  Recent visit to the emergency department with GI/viral infection.  Recovering well.  Status post CABG in 1995 with redo in 2006.  He underwent cardiac catheterization with circumflex, OM graft 8/15.  He underwent cardiac catheterization 08/02/2018 which showed occluded native RCA with left-to-right collaterals.  He was noted to have subtotal occlusion calcified ostial first obtuse marginal branch which was unable to cross.  Medical management was recommended.  Echocardiogram 11/24/2022 showed an EF decline from 50-55% to 35-40.  Status post MitraClip he was noted to only have minimal MR.  Repeat cardiac catheterization was not advisable due to his elevated creatinine.  Medical therapy was felt to be the best course of treatment. Continue Imdur, aspirin, Jardiance, furosemide, hydralazine, metoprolol Heart healthy low-sodium diet Continue daily weights-contact office with a weight increase of 2 to 3 pounds overnight or 5 pounds in 1 week  Hyperlipidemia-LDL 45 on 10/27/22 Continue fish oil, aspirin Heart healthy low-sodium diet-salty 6 given Increase physical activity as  tolerated  Mitral valve insufficiency-status post MitraClip.  Recent echocardiogram showed minimal MR.  No increased DOE or activity intolerance.  Essential hypertension-BP today 138/62 Continue metoprolol, Imdur Heart healthy low-sodium diet-salty 6 given Increase physical activity as tolerated  Paroxysmal atrial fibrillation-heart rate today 70.  Reports compliance with apixaban and denies bleeding issues. Avoid triggers caffeine, chocolate, EtOH, dehydration etc. Continue metoprolol, apixaban  Disposition: Follow-up with Dr.Berry in 3-4 months.   Jossie Ng. Kitrina Maurin NP-C     12/16/2022, 10:16 AM [facility] [address] Office [phone] Fax [phone]    I spent 14 minutes examining this patient, reviewing medications, and using patient centered shared decision making involving her cardiac care.  Prior to her visit I spent greater than 20 minutes reviewing her past medical history,  medications, and prior cardiac tests.

## 2022-12-14 NOTE — Patient Instructions (Signed)
Visit Information  Phone number for Pharmacist: [phone]  Thank you for meeting with me to discuss your medications! I look forward to working with you to achieve your health care goals. Below is a summary of what we talked about during the visit:   Recommendations/Changes made from today's visit: -No med changes; discussed importance of self monitoring (BP, HR, BG, weight, and for s/sx of bleeding)  Follow up plan: -Health Concierge will call patient 2 months for HF update -Pharmacist follow up televisit scheduled for 4 months -Cardiology appt 12/16/22, 03/01/23   Charlene Brooke, PharmD, BCACP Clinical Pharmacist [facility] [phone]

## 2022-12-16 ENCOUNTER — Encounter: Payer: Self-pay | Admitting: General Practice

## 2022-12-16 ENCOUNTER — Ambulatory Visit: Payer: Medicare Other | Attending: General Practice | Admitting: General Practice

## 2022-12-16 VITALS — BP 138/62 | HR 70 | Ht 67.0 in | Wt 165.4 lb

## 2022-12-16 DIAGNOSIS — I480 Paroxysmal atrial fibrillation: Secondary | ICD-10-CM

## 2022-12-16 DIAGNOSIS — I10 Essential (primary) hypertension: Secondary | ICD-10-CM | POA: Diagnosis not present

## 2022-12-16 DIAGNOSIS — E782 Mixed hyperlipidemia: Secondary | ICD-10-CM | POA: Diagnosis not present

## 2022-12-16 DIAGNOSIS — I340 Nonrheumatic mitral (valve) insufficiency: Secondary | ICD-10-CM | POA: Diagnosis not present

## 2022-12-16 DIAGNOSIS — I25709 Atherosclerosis of coronary artery bypass graft(s), unspecified, with unspecified angina pectoris: Secondary | ICD-10-CM | POA: Insufficient documentation

## 2022-12-16 NOTE — Patient Instructions (Signed)
Medication Instructions:  The current medical regimen is effective;  continue present plan and medications as directed. Please refer to the Current Medication list given to you today.  *If you need a refill on your cardiac medications before your next appointment, please call your pharmacy*  Lab Work: NONE If you have labs (blood work) drawn today and your tests are completely normal, you will receive your results only by: [place] (if you have MyChart) OR A paper copy in the mail If you have any lab test that is abnormal or we need to change your treatment, we will call you to review the results.  Testing/Procedures: NONE   Other Instructions INCREASE PHYSICAL ACTIVITY AS TOLERATED   CONTINUE DAILY WEIGHTS  PLEASE READ AND FOLLOW ATTACHED  SALTY 6  Follow-Up: At [facility], you and your health needs are our priority.  As part of our continuing mission to provide you with exceptional heart care, we have created designated Provider Care Teams.  These Care Teams include your primary Cardiologist (physician) and Advanced Practice Providers (APPs -  Physician Assistants and Nurse Practitioners) who all work together to provide you with the care you need, when you need it.  We recommend signing up for the patient portal called "MyChart".  Sign up information is provided on this After Visit Summary.  MyChart is used to connect with patients for Virtual Visits (Telemedicine).  Patients are able to view lab/test results, encounter notes, upcoming appointments, etc.  Non-urgent messages can be sent to your provider as well.   To learn more about what you can do with MyChart, go to [email or web address].    Your next appointment:   3-4 month(s)  Provider:   Quay Burow, MD  or Coletta Memos, FNP

## 2022-12-27 DIAGNOSIS — C44629 Squamous cell carcinoma of skin of left upper limb, including shoulder: Secondary | ICD-10-CM | POA: Diagnosis not present

## 2022-12-27 DIAGNOSIS — C44529 Squamous cell carcinoma of skin of other part of trunk: Secondary | ICD-10-CM | POA: Diagnosis not present

## 2022-12-27 DIAGNOSIS — Z1283 Encounter for screening for malignant neoplasm of skin: Secondary | ICD-10-CM | POA: Diagnosis not present

## 2023-01-09 ENCOUNTER — Ambulatory Visit (HOSPITAL_COMMUNITY)
Admission: RE | Admit: 2023-01-09 | Discharge: 2023-01-09 | Disposition: A | Discharge status: Home / Self Care | Payer: Medicare Other | Source: Ambulatory Visit | Attending: Cardiology | Admitting: Cardiology

## 2023-01-09 DIAGNOSIS — Z9889 Other specified postprocedural states: Secondary | ICD-10-CM | POA: Insufficient documentation

## 2023-01-09 DIAGNOSIS — I6523 Occlusion and stenosis of bilateral carotid arteries: Secondary | ICD-10-CM | POA: Insufficient documentation

## 2023-01-12 ENCOUNTER — Encounter: Payer: Self-pay | Admitting: Cardiovascular Disease

## 2023-01-13 ENCOUNTER — Other Ambulatory Visit: Payer: Self-pay | Admitting: Family Medicine

## 2023-01-24 DIAGNOSIS — X32XXXD Exposure to sunlight, subsequent encounter: Secondary | ICD-10-CM | POA: Diagnosis not present

## 2023-01-24 DIAGNOSIS — D225 Melanocytic nevi of trunk: Secondary | ICD-10-CM | POA: Diagnosis not present

## 2023-01-24 DIAGNOSIS — Z1283 Encounter for screening for malignant neoplasm of skin: Secondary | ICD-10-CM | POA: Diagnosis not present

## 2023-01-24 DIAGNOSIS — Z85820 Personal history of malignant melanoma of skin: Secondary | ICD-10-CM | POA: Diagnosis not present

## 2023-01-24 DIAGNOSIS — L989 Disorder of the skin and subcutaneous tissue, unspecified: Secondary | ICD-10-CM | POA: Diagnosis not present

## 2023-01-24 DIAGNOSIS — C44519 Basal cell carcinoma of skin of other part of trunk: Secondary | ICD-10-CM | POA: Diagnosis not present

## 2023-01-24 DIAGNOSIS — L570 Actinic keratosis: Secondary | ICD-10-CM | POA: Diagnosis not present

## 2023-01-24 DIAGNOSIS — D485 Neoplasm of uncertain behavior of skin: Secondary | ICD-10-CM | POA: Diagnosis not present

## 2023-01-24 DIAGNOSIS — Z08 Encounter for follow-up examination after completed treatment for malignant neoplasm: Secondary | ICD-10-CM | POA: Diagnosis not present

## 2023-02-22 ENCOUNTER — Telehealth: Payer: Self-pay | Admitting: Cardiovascular Disease

## 2023-02-22 MED ORDER — METOPROLOL TARTRATE 50 MG PO TABS: ORAL_TABLET | ORAL | 3 refills | Status: DC

## 2023-02-22 NOTE — Telephone Encounter (Signed)
Called pt to let him know [facility]. No answer, left message on machine.

## 2023-02-22 NOTE — Telephone Encounter (Signed)
*  STAT* If patient is at the pharmacy, call can be transferred to refill team.   1. Which medications need to be refilled? (please list name of each medication and dose if known)  metoprolol tartrate (LOPRESSOR) 50 MG tablet  2. Which pharmacy/location (including street and city if local pharmacy) is medication to be sent to? [facility] - [place], [place] - [address]   3. Do they need a 30 day or 90 day supply? 90

## 2023-03-01 ENCOUNTER — Telehealth: Payer: Self-pay | Admitting: *Deleted

## 2023-03-01 ENCOUNTER — Encounter: Payer: Self-pay | Admitting: Physician Assistant

## 2023-03-01 ENCOUNTER — Ambulatory Visit: Payer: Medicare Other | Attending: Physician Assistant | Admitting: Physician Assistant

## 2023-03-01 VITALS — BP 140/70 | HR 84 | Ht 67.0 in | Wt 162.4 lb

## 2023-03-01 DIAGNOSIS — N184 Chronic kidney disease, stage 4 (severe): Secondary | ICD-10-CM | POA: Diagnosis not present

## 2023-03-01 DIAGNOSIS — E782 Mixed hyperlipidemia: Secondary | ICD-10-CM | POA: Diagnosis not present

## 2023-03-01 DIAGNOSIS — I5020 Unspecified systolic (congestive) heart failure: Secondary | ICD-10-CM | POA: Diagnosis not present

## 2023-03-01 DIAGNOSIS — I82402 Acute embolism and thrombosis of unspecified deep veins of left lower extremity: Secondary | ICD-10-CM | POA: Diagnosis not present

## 2023-03-01 DIAGNOSIS — I340 Nonrheumatic mitral (valve) insufficiency: Secondary | ICD-10-CM | POA: Insufficient documentation

## 2023-03-01 DIAGNOSIS — I4821 Permanent atrial fibrillation: Secondary | ICD-10-CM | POA: Insufficient documentation

## 2023-03-01 DIAGNOSIS — I10 Essential (primary) hypertension: Secondary | ICD-10-CM | POA: Diagnosis not present

## 2023-03-01 DIAGNOSIS — I872 Venous insufficiency (chronic) (peripheral): Secondary | ICD-10-CM | POA: Diagnosis not present

## 2023-03-01 DIAGNOSIS — I25118 Atherosclerotic heart disease of native coronary artery with other forms of angina pectoris: Secondary | ICD-10-CM | POA: Diagnosis not present

## 2023-03-01 DIAGNOSIS — R600 Localized edema: Secondary | ICD-10-CM | POA: Insufficient documentation

## 2023-03-01 LAB — BASIC METABOLIC PANEL
BUN/Creatinine Ratio: 12 (ref 10–24)
BUN: 38 mg/dL — ABNORMAL HIGH (ref 8–27)
CO2: 30 mmol/L — ABNORMAL HIGH (ref 20–29)
Calcium: 10.4 mg/dL — ABNORMAL HIGH (ref 8.6–10.2)
Chloride: 98 mmol/L (ref 96–106)
Creatinine, Ser: 3.25 mg/dL — ABNORMAL HIGH (ref 0.76–1.27)
Glucose: 221 mg/dL — ABNORMAL HIGH (ref 70–99)
Potassium: 4.1 mmol/L (ref 3.5–5.2)
Sodium: 140 mmol/L (ref 134–144)
eGFR: 17 mL/min/{1.73_m2} — ABNORMAL LOW (ref >59)

## 2023-03-01 LAB — MAGNESIUM: Magnesium: 2.6 mg/dL — ABNORMAL HIGH (ref 1.6–2.3)

## 2023-03-01 MED ORDER — ROSUVASTATIN CALCIUM 10 MG PO TABS: 10.0000 mg | ORAL_TABLET | Freq: Every day | ORAL | 3 refills | Status: DC

## 2023-03-01 MED ORDER — TORSEMIDE 20 MG PO TABS: ORAL_TABLET | ORAL | 0 refills | Status: DC

## 2023-03-01 MED ORDER — MAGNESIUM OXIDE 400 MG PO TABS: 200.0000 mg | ORAL_TABLET | Freq: Every day | ORAL | 3 refills | Status: DC

## 2023-03-01 MED ORDER — ISOSORBIDE MONONITRATE ER 60 MG PO TB24: 60.0000 mg | ORAL_TABLET | Freq: Every day | ORAL | 3 refills | Status: DC

## 2023-03-01 MED ORDER — TORSEMIDE 20 MG PO TABS: ORAL_TABLET | ORAL | 3 refills | Status: DC

## 2023-03-01 NOTE — Assessment & Plan Note (Signed)
BP somewhat elevated today. This allows for further titration of his antianginals. Increase Imdur to 60 mg once daily as noted.  Continue hydralazine 25 mg daily, metoprolol tartrate 50 mg twice daily.

## 2023-03-01 NOTE — Telephone Encounter (Signed)
-----   Message from Beatrice Lecher, [place] sent at 03/01/2023  4:42 PM EDT ----- Renal function stable. K+ normal. Magnesium is high.  Med list indicates he takes Magnesium 400 mg once daily. PLAN:  -Decrease Magnesium to 200 mg once daily  -Will f/u on labs in 1 week as planned.  Tereso Newcomer, PA-C    03/01/2023 4:40 PM

## 2023-03-01 NOTE — Assessment & Plan Note (Signed)
LDL optimal in December 2023 at 45.  However, I will reduce his rosuvastatin to 10 mg daily because of renal function.

## 2023-03-01 NOTE — Assessment & Plan Note (Signed)
S/p MitraClip in 2021. Mild MR on echocardiogram in 11/2022. Continue SBE prophylaxis.

## 2023-03-01 NOTE — Assessment & Plan Note (Signed)
EF 35-40.  Ischemic cardiomyopathy.  As noted, because of his renal function and advanced age, he is being managed conservatively.  He is not a candidate for ACE/ARB/ARNI/MRA due to chronic kidney disease.  His hydralazine has previously been decreased secondary to hypotension.  He still has signs of volume overload.  His lower extremity edema is also likely worsened by venous insufficiency.  He did have significant diuresis in January with 1 dose of metolazone.  However, he felt much worse after taking this and his renal function also worsened.  We discussed changing furosemide to torsemide to see if we can get better diuresis without over diuresing him.   DC furosemide  Start torsemide 40 mg in the morning and 20 mg in the afternoon  BMET, magnesium today  Follow-up renal panel next week (results need to be faxed to Dr. Allena Katz with nephrology)  Continue metoprolol tartrate 50 mg twice daily, hydralazine 25 mg daily, Jardiance 10 mg daily.   We could consider changing metoprolol to tartrate to metoprolol succinate versus carvedilol depending upon his blood pressure. Follow-up with Dr. Allyson Sabal in 3 mos or sooner if needed.

## 2023-03-01 NOTE — Patient Instructions (Signed)
Medication Instructions:  Your physician has recommended you make the following change in your medication: 1.  STOP Lasix 2.  START Torsemide 20 mg taking 2 tablets in the morning and 1 tablet in the afternoon 4.  REDUCE the Rosuvastatin to 10 mg taking 1 daily 5.  INCREASE the Isosorbide to 60 mg taking 1 daily.  You can take 2 of the 30 mg tablets at a time until your mail order come in.  *If you need a refill on your cardiac medications before your next appointment, please call your pharmacy*   Lab Work: TODAY:  BMET & MAG  1 WEEK: BRING YOUR ORDERS FROM YOUR KIDNEY DR HERE AND WILL ORDER HIS LABS AND DRAW THEM HERE.  03/08/23 anytime before 5:00.  If you have labs (blood work) drawn today and your tests are completely normal, you will receive your results only by: MyChart Message (if you have MyChart) OR A paper copy in the mail If you have any lab test that is abnormal or we need to change your treatment, we will call you to review the results.   Testing/Procedures: Your physician has requested that you have a lower or upper extremity venousduplex  IN THE NEXT DAY OR 2 . This test is an ultrasound of the veins in the legs or arms. It looks at venous blood flow that carries blood from the heart to the legs or arms. Allow one hour for a Lower Venous exam. Allow thirty minutes for an Upper Venous exam. There are no restrictions or special instructions.     Follow-Up: At [facility], you and your health needs are our priority.  As part of our continuing mission to provide you with exceptional heart care, we have created designated Provider Care Teams.  These Care Teams include your primary Cardiologist (physician) and Advanced Practice Providers (APPs -  Physician Assistants and Nurse Practitioners) who all work together to provide you with the care you need, when you need it.  We recommend signing up for the patient portal called "MyChart".  Sign up information is provided on  this After Visit Summary.  MyChart is used to connect with patients for Virtual Visits (Telemedicine).  Patients are able to view lab/test results, encounter notes, upcoming appointments, etc.  Non-urgent messages can be sent to your provider as well.   To learn more about what you can do with MyChart, go to [email or web address].    Your next appointment:   06/14/23  Provider:   Nanetta Batty, MD     Other Instructions You have been referred to Wound clinic.  They will reach out to you to arrange this appointment.

## 2023-03-01 NOTE — Progress Notes (Addendum)
Cardiology Office Note:    Date:  03/01/2023  ID:  [name], DOB 1932-08-10, MRN [id number] PCP: Joaquim Nam, MD  [facility] Providers Cardiologist:  Nanetta Batty, MD      Patient Profile:   Coronary artery disease S/p CABG in 1996 and redo in 2006 S/p PCI to S-OM in 05/2014 (4 x 16 mm DES, 4 x 24 mm DES) Plavix non-responder Myoview 07/12/18: EF 39, inf/inf-lat infarct w minimal peri-infarct ischemia  LHC 08/02/18: RCA ost 100, LAD ost 100, LCx ost 70, OM1 90, 60; S-RCA 100 w L-R collats, S-OM 100, S-D1 patent, L-LAD patent; PCI of LCx not successful - Med Rx  (HFrEF) heart failure with reduced ejection fraction  TTE 02/2020: EF 45-50 TTE 01/2021: EF ~50 TTE 11/24/22: EF 35-40, inf/inf-sept/septal HK, mild to mod LVH, mildly reduced RVSF, severe pulmonary HTN, RVSP 60.7, mod LAE, mild MR, s/p MitrClip, no MS, mod TR, AV calcified w mean 8.2 mmHg, RAP 3, small ASD w L-R shunt Permanent atrial fibrillation  Mitral regurgitation  S/p MitraClip 01/2020  Carotid artery disease S/p R CEA in 1994 Carotid US 01/09/23: R 1-39; L 40-59  Chronic kidney disease  Hypertension  Hyperlipidemia  Hx of GI bleed  S/p L sided hemicolectomy and sigmoidectomy OSA     History of Present Illness:   [name] is a 89 y.o. male who returns for f/u on CAD, CHF, AFib, valvular heart disease. He was seen in Jan 2024 with worsening exertional chest pain relieved by NTG and decreased exercise tolerance. Imdur was started. A f/u echocardiogram showed a decline in his LVF with EF 35-40. He had f/u with Dr. Allyson Sabal 11/30/22. His symptoms were somewhat improved. Because of his chronic kidney disease, it was decided to manage him conservatively and avoid cardiac catheterization. He was seen in the ED with nausea and vomiting 12/06/22. His hsTrops were min elevated w/o trend. His Lasix was reduced due to suspected hypovolemia. He was last seen in clinic by Edd Fabian, NP 12/16/22. He is here with his  son today. The patient continues to note significant decreased exercise tolerance and weakness with minimal activity. He has a chest ache at times with exertion. He has not really had to take NTG very much. He notes increasing leg edema over the past 3 weeks. He has developed a wound on his R leg. His R leg is weeping. The L leg is much larger. He has not had orthopnea, syncope. He does not feel short of breath when he feels tired with activity. Of note, he was given a dose of Metolazone in Jan 2024 prior to seeing Dr. Allyson Sabal. His weight was down at that visit and he was feeling somewhat better. His NT-Pro BNP was > 6K prior to this. His weights have been stable at home.   Review of Systems  Constitutional: Negative for fever.  Respiratory:  Negative for cough.   Gastrointestinal:  Negative for hematochezia and melena.  Genitourinary:  Negative for hematuria.   See HPI    Studies Reviewed:    EKG:  not done  EKG from the ED 12/06/22 was reviewed and demonstrated AFib, HR 62, IVCD, RBBB, LAFB, QRS 179, QTc corrected for QRS 466 ms  Risk Assessment/Calculations:    CHA2DS2-VASc Score = 6   This indicates a 9.7% annual risk of stroke. The patient's score is based upon: CHF History: 1 HTN History: 1 Diabetes History: 1 Stroke History: 0 Vascular Disease History: 1 Age Score:  2 Gender Score: 0    HYPERTENSION CONTROL Vitals:   03/01/23 1337 03/01/23 1501  BP: 131/64 (!) 140/70    The patient's blood pressure is elevated above target today.  In order to address the patient's elevated BP:           Physical Exam:   VS:  BP (!) 140/70   Pulse 84   Ht  (1.702 m)   Wt 162 lb 6.4 oz (73.7 kg)   SpO2 95%   BMI 25.44 kg/m    Wt Readings from Last 3 Encounters:  03/01/23 162 lb 6.4 oz (73.7 kg)  12/16/22 165 lb 6.4 oz (75 kg)  12/06/22 155 lb (70.3 kg)    Constitutional:      Appearance: Not in distress. Chronically ill-appearing.  Neck:     Vascular: JVD normal.   Pulmonary:     Breath sounds: Normal breath sounds. No wheezing. No rales.  Cardiovascular:     Normal rate. Irregularly irregular rhythm.     Murmurs: There is a grade 2/6 systolic murmur at the URSB and LLSB.     Comments: Button sized wound R lower ext just above the ankle  Edema:    Peripheral edema present.    Pretibial: 2+ edema of the left pretibial area and 1+ edema of the right pretibial area.    Ankle: 2+ edema of the left ankle and 1+ edema of the right ankle. Abdominal:     Palpations: Abdomen is soft.  Skin:    General: Skin is warm and dry.        ASSESSMENT AND PLAN:   CAD (coronary artery disease) History of CABG in 1996 and redo in 2006 and subsequent PCI to the vein graft to the OM in 2015.  His last cardiac catheterization was in 2019 and demonstrated an occluded vein graft to the RCA.  The vein graft to the OM was not visualized.  He had high-grade disease in the LCx and OM1 but PCI was not successful.  LIMA-LAD and vein graft to D1 were patent.  He has been managed medically.  He was recently seen with worsening symptoms of angina.  Echocardiogram demonstrated worsening LV function.  Because of his renal function, conservative management has been recommended.  He continues to have decreased exercise tolerance and occasional exertional angina.  He did have significant diuresis with metolazone in January and was feeling somewhat better when he saw Dr. Allyson Sabal.  He now has significant lower extremity edema.  I suspect volume is playing somewhat of a role in his symptoms as well as ongoing angina. Adjust diuretics as noted below Increase isosorbide to 60 mg daily Continue ASA 81 mg daily, metoprolol tartrate 50 mg twice daily, ranolazine 500 mg twice daily, rosuvastatin, as needed nitroglycerin Follow-up with Dr. Allyson Sabal in 3 months  HFrEF (heart failure with reduced ejection fraction) EF 35-40.  Ischemic cardiomyopathy.  As noted, because of his renal function and advanced  age, he is being managed conservatively.  He is not a candidate for ACE/ARB/ARNI/MRA due to chronic kidney disease.  His hydralazine has previously been decreased secondary to hypotension.  He still has signs of volume overload.  His lower extremity edema is also likely worsened by venous insufficiency.  He did have significant diuresis in January with 1 dose of metolazone.  However, he felt much worse after taking this and his renal function also worsened.  We discussed changing furosemide to torsemide to see if we can get better  diuresis without over diuresing him.   DC furosemide  Start torsemide 40 mg in the morning and 20 mg in the afternoon  BMET, magnesium today  Follow-up renal panel next week (results need to be faxed to Dr. Allena Katz with nephrology)  Continue metoprolol tartrate 50 mg twice daily, hydralazine 25 mg daily, Jardiance 10 mg daily.   We could consider changing metoprolol to tartrate to metoprolol succinate versus carvedilol depending upon his blood pressure. Follow-up with Dr. Allyson Sabal in 3 mos or sooner if needed.   Permanent atrial fibrillation Rate is controlled. His SCr has been > 1.5 and his age is > 30. Continue Eliquis 2.5 mg twice daily.   Non-rheumatic mitral regurgitation S/p MitraClip in 2021. Mild MR on echocardiogram in 11/2022. Continue SBE prophylaxis.   HTN (hypertension) BP somewhat elevated today. This allows for further titration of his antianginals. Increase Imdur to 60 mg once daily as noted.  Continue hydralazine 25 mg daily, metoprolol tartrate 50 mg twice daily.  CKD (chronic kidney disease) stage 4, GFR 15-29 ml/min He is followed by Dr. Allena Katz with nephrology.  He is due for follow-up labs next week.  We will draw those here to follow-up on his renal function and potassium make adjustments in his diuretics and forward them to Dr. Allena Katz.  I reviewed his cardiac meds today.  His max dose of rosuvastatin should be 10 mg in the setting of creatinine clearance  <30.  Max dose for ranolazine is 500 mg twice daily.  However, we could consider reducing ranolazine to once daily.  QTc corrected for QRS interval on his most recent EKG is acceptable.  Therefore, I think it is okay to continue ranolazine 500 mg twice daily for now.  HLD (hyperlipidemia) LDL optimal in December 2023 at 45.  However, I will reduce his rosuvastatin to 10 mg daily because of renal function.  Bilateral lower extremity edema His L leg is much larger than his R. He notes weeping on the R and this may account for the difference. He is anticoagulated with Eliquis. It would be unusual if he developed a clot. But, b/c of the size difference, I will get a venous doppler to r/o DVT. Also, he needs his wound on his R leg managed. Obtain L lower ext venous doppler to r/o DVT Refer to wound clinic for R lower ext venous stasis ulcer       Dispo:  Return in about 3 months (around 05/31/2023) for Routine Follow Up w/ Dr. Allyson Sabal.  Signed, Tereso Newcomer, PA-C

## 2023-03-01 NOTE — Assessment & Plan Note (Signed)
His L leg is much larger than his R. He notes weeping on the R and this may account for the difference. He is anticoagulated with Eliquis. It would be unusual if he developed a clot. But, b/c of the size difference, I will get a venous doppler to r/o DVT. Also, he needs his wound on his R leg managed. Obtain L lower ext venous doppler to r/o DVT Refer to wound clinic for R lower ext venous stasis ulcer

## 2023-03-01 NOTE — Assessment & Plan Note (Signed)
He is followed by Dr. Allena Katz with nephrology.  He is due for follow-up labs next week.  We will draw those here to follow-up on his renal function and potassium make adjustments in his diuretics and forward them to Dr. Allena Katz.  I reviewed his cardiac meds today.  His max dose of rosuvastatin should be 10 mg in the setting of creatinine clearance <30.  Max dose for ranolazine is 500 mg twice daily.  However, we could consider reducing ranolazine to once daily.  QTc corrected for QRS interval on his most recent EKG is acceptable.  Therefore, I think it is okay to continue ranolazine 500 mg twice daily for now.

## 2023-03-01 NOTE — Assessment & Plan Note (Signed)
Rate is controlled. His SCr has been > 1.5 and his age is > 80. Continue Eliquis 2.5 mg twice daily.

## 2023-03-01 NOTE — Assessment & Plan Note (Addendum)
History of CABG in 1996 and redo in 2006 and subsequent PCI to the vein graft to the OM in 2015.  His last cardiac catheterization was in 2019 and demonstrated an occluded vein graft to the RCA.  The vein graft to the OM was not visualized.  He had high-grade disease in the LCx and OM1 but PCI was not successful.  LIMA-LAD and vein graft to D1 were patent.  He has been managed medically.  He was recently seen with worsening symptoms of angina.  Echocardiogram demonstrated worsening LV function.  Because of his renal function, conservative management has been recommended.  He continues to have decreased exercise tolerance and occasional exertional angina.  He did have significant diuresis with metolazone in January and was feeling somewhat better when he saw Dr. Allyson Sabal.  He now has significant lower extremity edema.  I suspect volume is playing somewhat of a role in his symptoms as well as ongoing angina. Adjust diuretics as noted below Increase isosorbide to 60 mg daily Continue ASA 81 mg daily, metoprolol tartrate 50 mg twice daily, ranolazine 500 mg twice daily, rosuvastatin, as needed nitroglycerin Follow-up with Dr. Allyson Sabal in 3 months

## 2023-03-02 ENCOUNTER — Ambulatory Visit (HOSPITAL_COMMUNITY)
Admission: RE | Admit: 2023-03-02 | Discharge: 2023-03-02 | Disposition: A | Discharge status: Home / Self Care | Payer: Medicare Other | Source: Ambulatory Visit | Attending: Physician Assistant | Admitting: Physician Assistant

## 2023-03-02 DIAGNOSIS — I872 Venous insufficiency (chronic) (peripheral): Secondary | ICD-10-CM | POA: Diagnosis not present

## 2023-03-02 DIAGNOSIS — R600 Localized edema: Secondary | ICD-10-CM | POA: Insufficient documentation

## 2023-03-02 DIAGNOSIS — I5020 Unspecified systolic (congestive) heart failure: Secondary | ICD-10-CM | POA: Insufficient documentation

## 2023-03-02 DIAGNOSIS — I25118 Atherosclerotic heart disease of native coronary artery with other forms of angina pectoris: Secondary | ICD-10-CM | POA: Diagnosis not present

## 2023-03-03 ENCOUNTER — Telehealth: Payer: Self-pay

## 2023-03-03 NOTE — Telephone Encounter (Signed)
-----   Message from Beatrice Lecher, [place] sent at 03/03/2023 12:49 PM EDT ----- Negative for DVT in both legs. PLAN:  - Continue current medications/treatment plan and follow up as scheduled.  Tereso Newcomer, PA-C    03/03/2023 12:46 PM

## 2023-03-03 NOTE — Telephone Encounter (Signed)
Patient aware of Vas [place] and provider recommendations. Verbalized understanding

## 2023-03-06 ENCOUNTER — Other Ambulatory Visit: Payer: Self-pay | Admitting: Family Medicine

## 2023-03-08 ENCOUNTER — Other Ambulatory Visit: Payer: Self-pay | Admitting: Physician Assistant

## 2023-03-08 ENCOUNTER — Other Ambulatory Visit: Payer: Self-pay | Admitting: Cardiovascular Disease

## 2023-03-08 ENCOUNTER — Encounter (HOSPITAL_BASED_OUTPATIENT_CLINIC_OR_DEPARTMENT_OTHER): Payer: Medicare Other | Attending: General Surgery | Admitting: Physician Assistant

## 2023-03-08 ENCOUNTER — Ambulatory Visit: Payer: Medicare Other | Attending: Physician Assistant

## 2023-03-08 DIAGNOSIS — N189 Chronic kidney disease, unspecified: Secondary | ICD-10-CM

## 2023-03-08 DIAGNOSIS — L97909 Non-pressure chronic ulcer of unspecified part of unspecified lower leg with unspecified severity: Secondary | ICD-10-CM | POA: Insufficient documentation

## 2023-03-08 DIAGNOSIS — I252 Old myocardial infarction: Secondary | ICD-10-CM | POA: Diagnosis not present

## 2023-03-08 DIAGNOSIS — L98499 Non-pressure chronic ulcer of skin of other sites with unspecified severity: Secondary | ICD-10-CM | POA: Insufficient documentation

## 2023-03-08 DIAGNOSIS — I4821 Permanent atrial fibrillation: Secondary | ICD-10-CM | POA: Diagnosis not present

## 2023-03-08 DIAGNOSIS — N184 Chronic kidney disease, stage 4 (severe): Secondary | ICD-10-CM

## 2023-03-08 DIAGNOSIS — M109 Gout, unspecified: Secondary | ICD-10-CM | POA: Insufficient documentation

## 2023-03-08 DIAGNOSIS — I5020 Unspecified systolic (congestive) heart failure: Secondary | ICD-10-CM | POA: Diagnosis not present

## 2023-03-08 DIAGNOSIS — E1140 Type 2 diabetes mellitus with diabetic neuropathy, unspecified: Secondary | ICD-10-CM | POA: Insufficient documentation

## 2023-03-08 DIAGNOSIS — E11622 Type 2 diabetes mellitus with other skin ulcer: Secondary | ICD-10-CM | POA: Insufficient documentation

## 2023-03-08 DIAGNOSIS — I10 Essential (primary) hypertension: Secondary | ICD-10-CM | POA: Insufficient documentation

## 2023-03-08 DIAGNOSIS — E559 Vitamin D deficiency, unspecified: Secondary | ICD-10-CM | POA: Diagnosis not present

## 2023-03-08 DIAGNOSIS — L97812 Non-pressure chronic ulcer of other part of right lower leg with fat layer exposed: Secondary | ICD-10-CM | POA: Diagnosis not present

## 2023-03-09 ENCOUNTER — Telehealth: Payer: Self-pay | Admitting: *Deleted

## 2023-03-09 DIAGNOSIS — I5020 Unspecified systolic (congestive) heart failure: Secondary | ICD-10-CM

## 2023-03-09 DIAGNOSIS — I4821 Permanent atrial fibrillation: Secondary | ICD-10-CM

## 2023-03-09 LAB — MAGNESIUM: Magnesium: 2.6 mg/dL — ABNORMAL HIGH (ref 1.6–2.3)

## 2023-03-09 LAB — RENAL FUNCTION PANEL
Albumin: 4 g/dL (ref 3.7–4.7)
BUN/Creatinine Ratio: 13 (ref 10–24)
BUN: 36 mg/dL — ABNORMAL HIGH (ref 8–27)
CO2: 25 mmol/L (ref 20–29)
Calcium: 9.8 mg/dL (ref 8.6–10.2)
Chloride: 100 mmol/L (ref 96–106)
Creatinine, Ser: 2.79 mg/dL — ABNORMAL HIGH (ref 0.76–1.27)
Glucose: 151 mg/dL — ABNORMAL HIGH (ref 70–99)
Phosphorus: 3.9 mg/dL (ref 2.8–4.1)
Potassium: 4.6 mmol/L (ref 3.5–5.2)
Sodium: 142 mmol/L (ref 134–144)
eGFR: 21 mL/min/{1.73_m2} — ABNORMAL LOW (ref >59)

## 2023-03-09 LAB — CBC
Hematocrit: 37.4 % — ABNORMAL LOW (ref 37.5–51.0)
Hemoglobin: 12.4 g/dL — ABNORMAL LOW (ref 13.0–17.7)
MCH: 31.6 pg (ref 26.6–33.0)
MCHC: 33.2 g/dL (ref 31.5–35.7)
MCV: 95 fL (ref 79–97)
Platelets: 151 10*3/uL (ref 150–450)
RBC: 3.92 x10E6/uL — ABNORMAL LOW (ref 4.14–5.80)
RDW: 13.8 % (ref 11.6–15.4)
WBC: 7.7 10*3/uL (ref 3.4–10.8)

## 2023-03-09 LAB — VITAMIN D 25 HYDROXY (VIT D DEFICIENCY, FRACTURES): Vit D, 25-Hydroxy: 54.3 ng/mL (ref 30.0–100.0)

## 2023-03-09 MED ORDER — TORSEMIDE 20 MG PO TABS: 40.0000 mg | ORAL_TABLET | Freq: Two times a day (BID) | ORAL | 1 refills | Status: DC

## 2023-03-09 NOTE — Telephone Encounter (Signed)
-----   Message from Beatrice Lecher, [place] sent at 03/09/2023 10:54 AM EDT ----- Yes, I forgot that he noted fatigue and chest ache with exertion, not really shortness of breath. We can try to increase the Torsemide more to help with diuresis. PLAN: -Increase Torsemide to 40 mg twice daily  -BMET 1 week -Arrange f/u in 2 weeks (he can see me) -Keep Dr. Allyson Sabal f/u in July Scott Weaver, PA-C    03/09/2023 10:53 AM

## 2023-03-10 NOTE — Progress Notes (Signed)
DECKLIN, WEDDINGTON ([id number]) [phone] [id number] Page 1 of 4 Visit Report for 03/08/2023 Abuse Risk Screen Details Patient Name: Date of Service: [name], [name] 03/08/2023 1:30 PM Medical Record Number: [id number] Patient Account Number: [id number] Date of Birth/Sex: Treating RN: [birth year] (89 y.o. [name], [name] Primary Care Myquan Schaumburg: Sunnie Nielsen Other Clinician: Referring Wilton Thrall: Treating Euretha Najarro/Extender: Prudy Feeler, Scott Weeks in Treatment: 0 Abuse Risk Screen Items Answer ABUSE RISK SCREEN: Has anyone close to you tried to hurt or harm you recentlyo No Do you feel uncomfortable with anyone in your familyo No Has anyone forced you do things that you didnt want to doo No Electronic Signature(s) Signed: 03/09/2023 4:51:53 PM By: Shawn Stall RN, [name] Entered By: Shawn Stall on 03/08/2023 13:50:33 -------------------------------------------------------------------------------- Activities of Daily Living Details Patient Name: Date of Service: Jeremiah, [name] 03/08/2023 1:30 PM Medical Record Number: [id number] Patient Account Number: [id number] Date of Birth/Sex: Treating RN: [birth year] (89 y.o. [name], [name] Primary Care Shawn Carattini: Sunnie Nielsen Other Clinician: Referring Victorino Fatzinger: Treating Nakul Avino/Extender: Prudy Feeler, Scott Weeks in Treatment: 0 Activities of Daily Living Items Answer Activities of Daily Living (Please select one for each item) Drive Automobile Not Able T Medications ake Completely Able Use T elephone Completely Able Care for Appearance Completely Able Use T oilet Completely Able Bath / Shower Completely Able Dress Self Completely Able Feed Self Completely Able Walk Need Assistance Get In / Out Bed Completely Able Housework Completely Able Prepare Meals Completely Able Handle Money Completely Able Shop for Self Completely Able Electronic Signature(s) Signed: 03/09/2023 4:51:53  PM By: Shawn Stall RN, [name] Entered By: Shawn Stall on 03/08/2023 13:51:33 -------------------------------------------------------------------------------- Education Screening Details Patient Name: Date of Service: [name]. 03/08/2023 1:30 PM Medical Record Number: [id number] Patient Account Number: [id number] Date of Birth/Sex: Treating RN: [birth year] (89 y.o. [name], [name] Primary Care Jeffree Cazeau: Sunnie Nielsen Other Clinician: Referring Jessi Pitstick: Treating Oneta Sigman/Extender: Prudy Feeler, Scott Weeks in Treatment: 0 Jeremiah, [name] ([id number]) [id number] Page 2 of 4 Primary Learner Assessed: Patient Learning Preferences/Education Level/Primary Language Learning Preference: Explanation, Demonstration, Printed Material Highest Education Level: College or Above Preferred Language: Economist Language Barrier: No Translator Needed: No Memory Deficit: No Emotional Barrier: No Cultural/Religious Beliefs Affecting Medical Care: No Physical Barrier Impaired Vision: No Impaired Hearing: No Decreased Hand dexterity: No Knowledge/Comprehension Knowledge Level: High Comprehension Level: High Ability to understand written instructions: High Ability to understand verbal instructions: High Motivation Anxiety Level: Calm Cooperation: Cooperative Education Importance: Acknowledges Need Interest in Health Problems: Asks Questions Perception: Coherent Willingness to Engage in Self-Management High Activities: Readiness to Engage in Self-Management High Activities: Electronic Signature(s) Signed: 03/09/2023 4:51:53 PM By: Shawn Stall RN, [name] Entered By: Shawn Stall on 03/08/2023 13:51:57 -------------------------------------------------------------------------------- Fall Risk Assessment Details Patient Name: Date of Service: [name]. 03/08/2023 1:30 PM Medical Record Number: [id number] Patient  Account Number: [id number] Date of Birth/Sex: Treating RN: [birth year] (89 y.o. [name], Millard.Loa Primary Care Jonessa Triplett: Sunnie Nielsen Other Clinician: Referring Vickee Mormino: Treating Deniah Saia/Extender: Oretha Caprice in Treatment: 0 Fall Risk Assessment Items Have you had 2 or more falls in the last 12 monthso 0 No Have you had any fall that resulted in injury in the last 12 monthso 0 No FALLS RISK SCREEN History of falling - immediate or within 3 months 0 No Secondary diagnosis (Do you have 2 or more medical diagnoseso) 0 No Ambulatory aid None/bed rest/wheelchair/nurse 0 No Crutches/cane/walker 15  Yes Furniture 0 No Intravenous therapy Access/Saline/Heparin Lock 0 No Gait/Transferring Normal/ bed rest/ wheelchair 0 Yes Weak (short steps with or without shuffle, stooped but able to lift head while walking, may seek 0 No support from furniture) Impaired (short steps with shuffle, may have difficulty arising from chair, head down, impaired 0 No balance) Mental Status Oriented to own ability 0 Yes Overestimates or forgets limitations 0 No Risk Level: Low Risk Score: 15 Jeremiah, Broughton [name] ([id number]) [phone] [id number] Page 3 of 4 Electronic Signature(s) -------------------------------------------------------------------------------- Foot Assessment Details Patient Name: Date of Service: Jeremiah, [name] 03/08/2023 1:30 PM Medical Record Number: [id number] Patient Account Number: [id number] Date of Birth/Sex: Treating RN: [birth year] (89 y.o. [name], [name] Primary Care Qunisha Bryk: Sunnie Nielsen Other Clinician: Referring Valleri Hendricksen: Treating Meggie Laseter/Extender: Prudy Feeler, Scott Weeks in Treatment: 0 Foot Assessment Items Site Locations + = Sensation present, - = Sensation absent, C = Callus, U = Ulcer R = Redness, [name] = Warmth, M = Maceration, PU = Pre-ulcerative lesion F = Fissure, S = Swelling, D =  Dryness Assessment Right: Left: Other Deformity: No No Prior Foot Ulcer: No No Prior Amputation: No No Charcot Joint: No No Ambulatory Status: Ambulatory With Help Assistance Device: Cane Gait: Steady Electronic Signature(s) Signed: 03/09/2023 4:51:53 PM By: Shawn Stall RN, [name] Entered By: Shawn Stall on 03/08/2023 13:57:10 -------------------------------------------------------------------------------- Nutrition Risk Screening Details Patient Name: Date of Service: NORVAL, SLAVEN 03/08/2023 1:30 PM Medical Record Number: [id number] Patient Account Number: [id number] Date of Birth/Sex: Treating RN: Feb 04, 1932 (89 y.o. Tammy Sours Primary Care Anni Hocevar: Sunnie Nielsen Other Clinician: Referring Devone Bonilla: Treating Jermaine Tholl/Extender: Prudy Feeler, Scott Weeks in Treatment: 0 Height (in): 67 Weight (lbs): 162.2 Body Mass Index (BMI): 25.4 KAELON, WEEKES ([id number]) [phone] [id number] Page 4 of 4 Nutrition Risk Screening Items Score Screening NUTRITION RISK SCREEN: I have an illness or condition that made me change the kind and/or amount of food I eat 2 Yes I eat fewer than two meals per day 0 No I eat few fruits and vegetables, or milk products 0 No I have three or more drinks of beer, liquor or wine almost every day 0 No I have tooth or mouth problems that make it hard for me to eat 0 No I don't always have enough money to buy the food I need 0 No I eat alone most of the time 0 No I take three or more different prescribed or over-the-counter drugs a day 1 Yes Without wanting to, I have lost or gained 10 pounds in the last six months 0 No I am not always physically able to shop, cook and/or feed myself 0 No Nutrition Protocols Good Risk Protocol Provide education on elevated blood Moderate Risk Protocol 0 sugars and impact on wound healing, as applicable High Risk Proctocol Risk Level: Moderate Risk Score: 3 Electronic  Signature(s) Signed: 03/09/2023 4:51:53 PM By: Shawn Stall RN, [name] Entered By: Shawn Stall on 03/08/2023 13:52:43

## 2023-03-11 DIAGNOSIS — E1142 Type 2 diabetes mellitus with diabetic polyneuropathy: Secondary | ICD-10-CM | POA: Diagnosis not present

## 2023-03-11 DIAGNOSIS — H353230 Exudative age-related macular degeneration, bilateral, stage unspecified: Secondary | ICD-10-CM | POA: Diagnosis not present

## 2023-03-11 DIAGNOSIS — I87331 Chronic venous hypertension (idiopathic) with ulcer and inflammation of right lower extremity: Secondary | ICD-10-CM | POA: Diagnosis not present

## 2023-03-11 DIAGNOSIS — M109 Gout, unspecified: Secondary | ICD-10-CM | POA: Diagnosis not present

## 2023-03-11 DIAGNOSIS — N183 Chronic kidney disease, stage 3 unspecified: Secondary | ICD-10-CM | POA: Diagnosis not present

## 2023-03-11 DIAGNOSIS — G4733 Obstructive sleep apnea (adult) (pediatric): Secondary | ICD-10-CM | POA: Diagnosis not present

## 2023-03-11 DIAGNOSIS — I272 Pulmonary hypertension, unspecified: Secondary | ICD-10-CM | POA: Diagnosis not present

## 2023-03-11 DIAGNOSIS — E785 Hyperlipidemia, unspecified: Secondary | ICD-10-CM | POA: Diagnosis not present

## 2023-03-11 DIAGNOSIS — H9190 Unspecified hearing loss, unspecified ear: Secondary | ICD-10-CM | POA: Diagnosis not present

## 2023-03-11 DIAGNOSIS — M1990 Unspecified osteoarthritis, unspecified site: Secondary | ICD-10-CM | POA: Diagnosis not present

## 2023-03-11 DIAGNOSIS — I4510 Unspecified right bundle-branch block: Secondary | ICD-10-CM | POA: Diagnosis not present

## 2023-03-11 DIAGNOSIS — K5790 Diverticulosis of intestine, part unspecified, without perforation or abscess without bleeding: Secondary | ICD-10-CM | POA: Diagnosis not present

## 2023-03-11 DIAGNOSIS — N529 Male erectile dysfunction, unspecified: Secondary | ICD-10-CM | POA: Diagnosis not present

## 2023-03-11 DIAGNOSIS — L97812 Non-pressure chronic ulcer of other part of right lower leg with fat layer exposed: Secondary | ICD-10-CM | POA: Diagnosis not present

## 2023-03-11 DIAGNOSIS — I5042 Chronic combined systolic (congestive) and diastolic (congestive) heart failure: Secondary | ICD-10-CM | POA: Diagnosis not present

## 2023-03-11 DIAGNOSIS — I4821 Permanent atrial fibrillation: Secondary | ICD-10-CM | POA: Diagnosis not present

## 2023-03-11 DIAGNOSIS — E1122 Type 2 diabetes mellitus with diabetic chronic kidney disease: Secondary | ICD-10-CM | POA: Diagnosis not present

## 2023-03-11 DIAGNOSIS — Z7982 Long term (current) use of aspirin: Secondary | ICD-10-CM | POA: Diagnosis not present

## 2023-03-11 DIAGNOSIS — D631 Anemia in chronic kidney disease: Secondary | ICD-10-CM | POA: Diagnosis not present

## 2023-03-11 DIAGNOSIS — I2510 Atherosclerotic heart disease of native coronary artery without angina pectoris: Secondary | ICD-10-CM | POA: Diagnosis not present

## 2023-03-11 DIAGNOSIS — Z794 Long term (current) use of insulin: Secondary | ICD-10-CM | POA: Diagnosis not present

## 2023-03-11 DIAGNOSIS — R197 Diarrhea, unspecified: Secondary | ICD-10-CM | POA: Diagnosis not present

## 2023-03-11 DIAGNOSIS — I130 Hypertensive heart and chronic kidney disease with heart failure and stage 1 through stage 4 chronic kidney disease, or unspecified chronic kidney disease: Secondary | ICD-10-CM | POA: Diagnosis not present

## 2023-03-11 DIAGNOSIS — I4892 Unspecified atrial flutter: Secondary | ICD-10-CM | POA: Diagnosis not present

## 2023-03-11 DIAGNOSIS — I252 Old myocardial infarction: Secondary | ICD-10-CM | POA: Diagnosis not present

## 2023-03-13 NOTE — Progress Notes (Signed)
RONRICO, DUPIN ([id number]) [id number] Page 1 of 6 Visit Report for 03/08/2023 Allergy List Details Patient Name: Date of Service: [name], [name] 03/08/2023 1:30 PM Medical Record Number: [id number] Patient Account Number: [id number] Date of Birth/Sex: Treating RN: Oct 15, 1932 (89 y.o. Harlon Flor, Yvonne Kendall Primary Care Fielding Mault: Sunnie Nielsen Other Clinician: Referring Alter Moss: Treating Rosealee Recinos/Extender: Doreen Beam Weeks in Treatment: 0 Allergies Active Allergies No Known Allergies Allergy Notes Electronic Signature(s) Signed: 03/09/2023 4:51:53 PM By: Shawn Stall RN, [name] Entered By: Shawn Stall on 03/07/2023 13:40:26 -------------------------------------------------------------------------------- Arrival Information Details Patient Name: Date of Service: Jeremiah Bookbinder. 03/08/2023 1:30 PM Medical Record Number: [id number] Patient Account Number: [id number] Date of Birth/Sex: Treating RN: 07/24/1932 (89 y.o. Harlon Flor, Millard.Loa Primary Care Jakell Trusty: Sunnie Nielsen Other Clinician: Referring Demiana Crumbley: Treating Consandra Laske/Extender: Oretha Caprice in Treatment: 0 Visit Information Patient Arrived: Danella Maiers Time: 13:47 Accompanied By: son Transfer Assistance: None Patient Identification Verified: Yes Secondary Verification Process Completed: Yes Patient Requires Transmission-Based Precautions: No Patient Has Alerts: Yes Patient Alerts: Patient on Blood Thinner eliquis ABI R 0.93 3/23 History Since Last Visit Added or deleted any medications: No Any new allergies or adverse reactions: No Had a fall or experienced change in activities of daily living that may affect risk of falls: No Signs or symptoms of abuse/neglect since last visito No Hospitalized since last visit: No Implantable device outside of the clinic excluding cellular tissue based products placed in the center since last visit: No Has  Compression in Place as Prescribed: No Pain Present Now: Yes Notes per patient has not been wearing compression stockings since discharge from the clinic last year. Electronic Signature(s) Signed: 03/09/2023 4:51:53 PM By: Shawn Stall RN, [name] Entered By: Shawn Stall on 03/08/2023 13:51:13 -------------------------------------------------------------------------------- Compression Therapy Details Patient Name: Date of Service: Jeremiah Bookbinder. 03/08/2023 1:30 PM Medical Record Number: [id number] Patient Account Number: [id number] Date of Birth/Sex: Treating RN: [birth year] (89 y.o. Dianna Limbo Primary Care Kumiko Fishman: Sunnie Nielsen Other Clinician: Referring Marliss Buttacavoli: Treating Donica Derouin/Extender: Doreen Beam Weeks in Treatment: 0 Compression Therapy Performed for Wound Assessment: Wound #2 Right,Medial Lower Leg Performed By: Clinician Karie Schwalbe, RN Compression Type: [address] GUSS, FARRUGGIA ([id number]) [id number] Page 2 of 6 Post Procedure Diagnosis Same as Pre-procedure Notes Or URGO K2 Lite Electronic Signature(s) Signed: 03/08/2023 5:08:22 PM By: Karie Schwalbe RN Entered By: Karie Schwalbe on 03/08/2023 14:44:58 -------------------------------------------------------------------------------- Lower Extremity Assessment Details Patient Name: Date of Service: [name], [name] 03/08/2023 1:30 PM Medical Record Number: [id number] Patient Account Number: [id number] Date of Birth/Sex: Treating RN: [birth year] (89 y.o. Harlon Flor, Yvonne Kendall Primary Care Macedonio Scallon: Sunnie Nielsen Other Clinician: Referring Marlene Beidler: Treating Merville Hijazi/Extender: Prudy Feeler, Scott Weeks in Treatment: 0 Edema Assessment Assessed: [Left: No] [Right: Yes] Edema: [Left: Ye] [Right: s] Calf Left: Right: Point of Measurement: 28 cm From Medial Instep 29 cm Ankle Left: Right: Point of Measurement: 10 cm From Medial Instep 22  cm Knee To Floor Left: Right: From Medial Instep 46 cm Vascular Assessment Pulses: Dorsalis Pedis Palpable: [Right:Yes] Electronic Signature(s) Signed: 03/09/2023 4:51:53 PM By: Shawn Stall RN, [name] Entered By: Shawn Stall on 03/08/2023 13:56:32 -------------------------------------------------------------------------------- Multi-Disciplinary Care Plan Details Patient Name: Date of Service: Jeremiah Bookbinder. 03/08/2023 1:30 PM Medical Record Number: [id number] Patient Account Number: [id number] Date of Birth/Sex: Treating RN: December 28, 1931 (89 y.o. Harlon Flor, Yvonne Kendall Primary Care Alyza Artiaga: Sunnie Nielsen Other Clinician: Referring Samin Milke: Treating Stacyann Mcconaughy/Extender: Prudy Feeler, Lorin Picket  Weeks in Treatment: 0 Active Inactive Orientation to the Wound Care Program Nursing Diagnoses: Knowledge deficit related to the wound healing center program [name], [name] ([id number]) [id number] Page 3 of 6 Goals: Patient/caregiver will verbalize understanding of the Wound Healing Center Program Date Initiated: 03/08/2023 Target Resolution Date: 03/24/2023 Goal Status: Active Interventions: Provide education on orientation to the wound center Notes: Pain, Acute or Chronic Nursing Diagnoses: Pain, acute or chronic: actual or potential Potential alteration in comfort, pain Goals: Patient will verbalize adequate pain control and receive pain control interventions during procedures as needed Date Initiated: 03/08/2023 Target Resolution Date: 03/25/2023 Goal Status: Active Patient/caregiver will verbalize comfort level met Date Initiated: 03/08/2023 Target Resolution Date: 03/23/2023 Goal Status: Active Interventions: Encourage patient to take pain medications as prescribed Provide education on pain management Treatment Activities: Administer pain control measures as ordered : 03/08/2023 Notes: Venous Leg Ulcer Nursing Diagnoses: Knowledge deficit  related to disease process and management Goals: Patient will maintain optimal edema control Date Initiated: 03/08/2023 Target Resolution Date: 03/23/2023 Goal Status: Active Interventions: Assess peripheral edema status every visit. Provide education on venous insufficiency Notes: Wound/Skin Impairment Nursing Diagnoses: Knowledge deficit related to ulceration/compromised skin integrity Goals: Patient/caregiver will verbalize understanding of skin care regimen Date Initiated: 03/08/2023 Target Resolution Date: 03/24/2023 Goal Status: Active Interventions: Assess patient/caregiver ability to perform ulcer/skin care regimen upon admission and as needed Assess ulceration(s) every visit Provide education on ulcer and skin care Treatment Activities: Skin care regimen initiated : 03/08/2023 Topical wound management initiated : 03/08/2023 Notes: Electronic Signature(s) Signed: 03/09/2023 4:51:53 PM By: Shawn Stall RN, [name] Entered By: Shawn Stall on 03/08/2023 14:03:33 Purdom, Landis Martins ([id number]) [id number] Page 4 of 6 -------------------------------------------------------------------------------- Pain Assessment Details Patient Name: Date of Service: [name], [name] 03/08/2023 1:30 PM Medical Record Number: [id number] Patient Account Number: [id number] Date of Birth/Sex: Treating RN: 10-30-1932 (89 y.o. Harlon Flor, Yvonne Kendall Primary Care Thu Baggett: Sunnie Nielsen Other Clinician: Referring Antonela Freiman: Treating Tion Tse/Extender: Doreen Beam Weeks in Treatment: 0 Active Problems Location of Pain Severity and Description of Pain Patient Has Paino Yes Site Locations Pain Location: Generalized Pain, Pain in Ulcers Rate the pain. Current Pain Level: 3 Pain Management and Medication Current Pain Management: Medication: No Cold Application: No Rest: No Massage: No Activity: No T.E.N.S.: No Heat Application: No Leg drop or elevation:  No Is the Current Pain Management Adequate: Adequate How does your wound impact your activities of daily livingo Sleep: No Bathing: No Appetite: No Relationship With Others: No Bladder Continence: No Emotions: No Bowel Continence: No Work: No Toileting: No Drive: No Dressing: No Hobbies: No Psychologist, prison and probation services) Signed: 03/09/2023 4:51:53 PM By: Shawn Stall RN, [name] Entered By: Shawn Stall on 03/08/2023 13:52:16 -------------------------------------------------------------------------------- Patient/Caregiver Education Details Patient Name: Date of Service: [name], [name] 4/24/2024andnbsp1:30 PM Medical Record Number: [id number] Patient Account Number: [id number] Date of Birth/Gender: Treating RN: 11-30-1931 (89 y.o. Harlon Flor, Yvonne Kendall Primary Care Physician: Sunnie Nielsen Other Clinician: Referring Physician: Treating Physician/Extender: Oretha Caprice in Treatment: 0 Education Assessment Education Provided To: Patient and Caregiver Education Topics Provided Welcome T The [facility]-New Patient Packet: o Handouts: Medication Safety, The Wound Healing Pledge form, Welcome T [facility] MARCUS, SCHWANDT ([id number]) [id number] Page 5 of 6 Methods: Explain/Verbal Responses: Reinforcements needed Wound/Skin Impairment: Handouts: Caring for Your Ulcer Methods: Explain/Verbal Responses: Reinforcements needed Electronic Signature(s) Signed: 03/09/2023 4:51:53 PM By: Shawn Stall RN, [name] Entered By: Shawn Stall on 03/08/2023 14:03:51 --------------------------------------------------------------------------------  Wound Assessment Details Patient Name: Date of Service: [name], [name] 03/08/2023 1:30 PM Medical Record Number: [id number] Patient Account Number: [id number] Date of Birth/Sex: Treating RN: [birth year] (89 y.o. Harlon Flor, Millard.Loa Primary Care Evelise Reine: Sunnie Nielsen Other  Clinician: Referring Bayne Fosnaugh: Treating Katai Marsico/Extender: Prudy Feeler, Scott Weeks in Treatment: 0 Wound Status Wound Number: 2 Primary Diabetic Wound/Ulcer of the Lower Extremity Etiology: Wound Location: Right, Medial Lower Leg Secondary Lymphedema Wounding Event: Gradually Appeared Etiology: Date Acquired: 02/05/2023 Wound Open Weeks Of Treatment: 0 Status: Clustered Wound: No Comorbid Anemia, Arrhythmia, Congestive Heart Failure, Coronary Artery History: Disease, Hypertension, Myocardial Infarction, Type II Diabetes, Gout, Neuropathy Photos Wound Measurements Length: (cm) Width: (cm) Depth: (cm) Area: (cm) Volume: (cm) 1.1 % Reduction in Area: 1 % Reduction in Volume: 0.1 0.864 0.086 Wound Description Classification: Grade 1 Periwound Skin Texture Texture Color No Abnormalities Noted: No No Abnormalities Noted: No Moisture No Abnormalities Noted: No Electronic Signature(s) Signed: 03/09/2023 4:51:53 PM By: Shawn Stall RN, [name] Signed: 03/13/2023 10:05:50 AM By: Karl Ito Entered By: Karl Ito on 03/08/2023 13:56:41 Pennebaker, Landis Martins ([id number]) [id number] Page 6 of 6 -------------------------------------------------------------------------------- Vitals Details Patient Name: Date of Service: [name], [name] 03/08/2023 1:30 PM Medical Record Number: [id number] Patient Account Number: [id number] Date of Birth/Sex: Treating RN: [birth year] (89 y.o. Harlon Flor, Millard.Loa Primary Care Amyrah Pinkhasov: Sunnie Nielsen Other Clinician: Referring Maela Takeda: Treating Kambrie Eddleman/Extender: Prudy Feeler, Scott Weeks in Treatment: 0 Vital Signs Time Taken: 13:48 Temperature (F): 97.9 Height (in): 67 Pulse (bpm): 57 Source: Stated Respiratory Rate (breaths/min): 20 Weight (lbs): 162.2 Blood Pressure (mmHg): 144/72 Body Mass Index (BMI): 25.4 Reference Range: 80 - 120 mg / dl Electronic Signature(s) Signed:  03/09/2023 4:51:53 PM By: Shawn Stall RN, [name] Entered By: Shawn Stall on 03/08/2023 13:49:45

## 2023-03-15 ENCOUNTER — Ambulatory Visit: Payer: Medicare Other | Attending: Physician Assistant

## 2023-03-15 ENCOUNTER — Encounter (HOSPITAL_BASED_OUTPATIENT_CLINIC_OR_DEPARTMENT_OTHER): Payer: Medicare Other | Attending: Physician Assistant | Admitting: Physician Assistant

## 2023-03-15 DIAGNOSIS — I5020 Unspecified systolic (congestive) heart failure: Secondary | ICD-10-CM

## 2023-03-15 DIAGNOSIS — Z7901 Long term (current) use of anticoagulants: Secondary | ICD-10-CM | POA: Insufficient documentation

## 2023-03-15 DIAGNOSIS — N184 Chronic kidney disease, stage 4 (severe): Secondary | ICD-10-CM | POA: Diagnosis not present

## 2023-03-15 DIAGNOSIS — E1140 Type 2 diabetes mellitus with diabetic neuropathy, unspecified: Secondary | ICD-10-CM | POA: Diagnosis not present

## 2023-03-15 DIAGNOSIS — E11622 Type 2 diabetes mellitus with other skin ulcer: Secondary | ICD-10-CM | POA: Insufficient documentation

## 2023-03-15 DIAGNOSIS — E1122 Type 2 diabetes mellitus with diabetic chronic kidney disease: Secondary | ICD-10-CM | POA: Insufficient documentation

## 2023-03-15 DIAGNOSIS — I130 Hypertensive heart and chronic kidney disease with heart failure and stage 1 through stage 4 chronic kidney disease, or unspecified chronic kidney disease: Secondary | ICD-10-CM | POA: Diagnosis not present

## 2023-03-15 DIAGNOSIS — I4821 Permanent atrial fibrillation: Secondary | ICD-10-CM | POA: Diagnosis not present

## 2023-03-15 DIAGNOSIS — N189 Chronic kidney disease, unspecified: Secondary | ICD-10-CM | POA: Diagnosis not present

## 2023-03-15 DIAGNOSIS — E559 Vitamin D deficiency, unspecified: Secondary | ICD-10-CM | POA: Diagnosis not present

## 2023-03-15 DIAGNOSIS — I5042 Chronic combined systolic (congestive) and diastolic (congestive) heart failure: Secondary | ICD-10-CM | POA: Diagnosis not present

## 2023-03-15 DIAGNOSIS — Z794 Long term (current) use of insulin: Secondary | ICD-10-CM | POA: Diagnosis not present

## 2023-03-15 DIAGNOSIS — D631 Anemia in chronic kidney disease: Secondary | ICD-10-CM | POA: Diagnosis not present

## 2023-03-15 DIAGNOSIS — I129 Hypertensive chronic kidney disease with stage 1 through stage 4 chronic kidney disease, or unspecified chronic kidney disease: Secondary | ICD-10-CM | POA: Diagnosis not present

## 2023-03-15 DIAGNOSIS — L97812 Non-pressure chronic ulcer of other part of right lower leg with fat layer exposed: Secondary | ICD-10-CM | POA: Diagnosis not present

## 2023-03-15 DIAGNOSIS — Z85828 Personal history of other malignant neoplasm of skin: Secondary | ICD-10-CM | POA: Diagnosis not present

## 2023-03-15 DIAGNOSIS — I2510 Atherosclerotic heart disease of native coronary artery without angina pectoris: Secondary | ICD-10-CM | POA: Diagnosis not present

## 2023-03-15 DIAGNOSIS — I87321 Chronic venous hypertension (idiopathic) with inflammation of right lower extremity: Secondary | ICD-10-CM | POA: Diagnosis not present

## 2023-03-15 DIAGNOSIS — I480 Paroxysmal atrial fibrillation: Secondary | ICD-10-CM | POA: Insufficient documentation

## 2023-03-15 LAB — BASIC METABOLIC PANEL
BUN/Creatinine Ratio: 12 (ref 10–24)
BUN: 40 mg/dL — ABNORMAL HIGH (ref 8–27)
CO2: 28 mmol/L (ref 20–29)
Calcium: 9.4 mg/dL (ref 8.6–10.2)
Chloride: 95 mmol/L — ABNORMAL LOW (ref 96–106)
Creatinine, Ser: 3.41 mg/dL — ABNORMAL HIGH (ref 0.76–1.27)
Glucose: 159 mg/dL — ABNORMAL HIGH (ref 70–99)
Potassium: 4.3 mmol/L (ref 3.5–5.2)
Sodium: 138 mmol/L (ref 134–144)
eGFR: 17 mL/min/{1.73_m2} — ABNORMAL LOW (ref >59)

## 2023-03-16 ENCOUNTER — Telehealth: Payer: Self-pay

## 2023-03-16 MED ORDER — TORSEMIDE 20 MG PO TABS: ORAL_TABLET | ORAL | 3 refills | Status: DC

## 2023-03-16 NOTE — Progress Notes (Signed)
TIMO, HARTWIG ([id number]) [id number] Page 1 of 6 Visit Report for 03/15/2023 Arrival Information Details Patient Name: Date of Service: CHAY, MAZZONI 03/15/2023 2:45 PM Medical Record Number: [id number] Patient Account Number: [id number] Date of Birth/Sex: Treating RN: 07-05-1932 (89 y.o. Dianna Limbo Primary Care Kymberli Wiegand: Sunnie Nielsen Other Clinician: Referring Winifred Bodiford: Treating Mikail Goostree/Extender: Donney Rankins Weeks in Treatment: 1 Visit Information History Since Last Visit Added or deleted any medications: No Patient Arrived: Gilmer Mor Any new allergies or adverse reactions: No Arrival Time: 15:02 Had a fall or experienced change in No Accompanied By: son activities of daily living that may affect Transfer Assistance: None risk of falls: Patient Identification Verified: Yes Signs or symptoms of abuse/neglect since last visito No Patient Requires Transmission-Based Precautions: No Hospitalized since last visit: No Patient Has Alerts: Yes Implantable device outside of the clinic excluding No Patient Alerts: Patient on Blood Thinner cellular tissue based products placed in the center eliquis since last visit: ABI R 0.93 3/23 Has Dressing in Place as Prescribed: Yes Has Compression in Place as Prescribed: Yes Pain Present Now: Yes Electronic Signature(s) Signed: 03/15/2023 5:01:41 PM By: Karie Schwalbe RN Entered By: Karie Schwalbe on 03/15/2023 15:03:13 -------------------------------------------------------------------------------- Compression Therapy Details Patient Name: Date of Service: [name] 03/15/2023 2:45 PM Medical Record Number: [id number] Patient Account Number: [id number] Date of Birth/Sex: Treating RN: 26-Jul-1932 (89 y.o. Dianna Limbo Primary Care Jasher Barkan: Sunnie Nielsen Other Clinician: Referring Keyaria Lawson: Treating Dalonda Simoni/Extender: Donney Rankins Weeks in  Treatment: 1 Compression Therapy Performed for Wound Assessment: Wound #2 Right,Medial Lower Leg Performed By: Clinician Karie Schwalbe, RN Compression Type: Three Layer Post Procedure Diagnosis Same as Pre-procedure Electronic Signature(s) Signed: 03/15/2023 5:01:41 PM By: Karie Schwalbe RN Entered By: Karie Schwalbe on 03/15/2023 15:29:17 -------------------------------------------------------------------------------- Encounter Discharge Information Details Patient Name: Date of Service: [name]. 03/15/2023 2:45 PM Medical Record Number: [id number] Patient Account Number: [id number] Date of Birth/Sex: Treating RN: Aug 27, 1932 (89 y.o. Dianna Limbo Primary Care Sanel Stemmer: Sunnie Nielsen Other Clinician: Referring Chihiro Frey: Treating Pairlee Sawtell/Extender: Donney Rankins Weeks in Treatment: 1 Encounter Discharge Information Items Post Procedure Vitals Discharge Condition: Stable Temperature (F): 98.1 Ambulatory Status: Cane Pulse (bpm): 60 Discharge Destination: Home Respiratory Rate (breaths/min): 20 Transportation: Private Auto Blood Pressure (mmHg): 106/57 INMER, NIX ([id number]) [id number] Page 2 of 6 Accompanied By: son Schedule Follow-up Appointment: No Clinical Summary of Care: Electronic Signature(s) Signed: 03/15/2023 5:01:41 PM By: Karie Schwalbe RN Entered By: Karie Schwalbe on 03/15/2023 16:12:00 -------------------------------------------------------------------------------- Lower Extremity Assessment Details Patient Name: Date of Service: DUY, LEMMING 03/15/2023 2:45 PM Medical Record Number: [id number] Patient Account Number: [id number] Date of Birth/Sex: Treating RN: 1932/05/25 (89 y.o. Dianna Limbo Primary Care Kendle Erker: Sunnie Nielsen Other Clinician: Referring Cristal Howatt: Treating Kailoni Vahle/Extender: Donney Rankins Weeks in Treatment: 1 Edema  Assessment Assessed: [Left: No] [Right: No] Edema: [Left: Ye] [Right: s] Calf Left: Right: Point of Measurement: 28 cm From Medial Instep 29 cm Ankle Left: Right: Point of Measurement: 10 cm From Medial Instep 21.8 cm Vascular Assessment Pulses: Dorsalis Pedis Palpable: [Right:Yes] Electronic Signature(s) Signed: 03/15/2023 5:01:41 PM By: Karie Schwalbe RN Entered By: Karie Schwalbe on 03/15/2023 15:13:15 -------------------------------------------------------------------------------- Multi-Disciplinary Care Plan Details Patient Name: Date of Service: [name]. 03/15/2023 2:45 PM Medical Record Number: [id number] Patient Account Number: [id number] Date of Birth/Sex: Treating RN: 05-05-1932 (89 y.o. Dianna Limbo Primary Care Connie Hilgert: Sunnie Nielsen Other Clinician:  Referring Keniya Schlotterbeck: Treating Kayce Chismar/Extender: Donney Rankins Weeks in Treatment: 1 Active Inactive Orientation to the Wound Care Program Nursing Diagnoses: Knowledge deficit related to the wound healing center program Goals: Patient/caregiver will verbalize understanding of the Wound Healing Center Program Date Initiated: 03/08/2023 Target Resolution Date: 04/24/2023 Goal Status: Active Interventions: Provide education on orientation to the wound center KARSYN, ROCHIN ([id number]) [id number] Page 3 of 6 Notes: Pain, Acute or Chronic Nursing Diagnoses: Pain, acute or chronic: actual or potential Potential alteration in comfort, pain Goals: Patient will verbalize adequate pain control and receive pain control interventions during procedures as needed Date Initiated: 03/08/2023 Target Resolution Date: 04/25/2023 Goal Status: Active Patient/caregiver will verbalize comfort level met Date Initiated: 03/08/2023 Target Resolution Date: 04/23/2023 Goal Status: Active Interventions: Encourage patient to take pain medications as prescribed Provide  education on pain management Treatment Activities: Administer pain control measures as ordered : 03/08/2023 Notes: Venous Leg Ulcer Nursing Diagnoses: Knowledge deficit related to disease process and management Goals: Patient will maintain optimal edema control Date Initiated: 03/08/2023 Target Resolution Date: 04/23/2023 Goal Status: Active Interventions: Assess peripheral edema status every visit. Provide education on venous insufficiency Notes: Wound/Skin Impairment Nursing Diagnoses: Knowledge deficit related to ulceration/compromised skin integrity Goals: Patient/caregiver will verbalize understanding of skin care regimen Date Initiated: 03/08/2023 Target Resolution Date: 04/24/2023 Goal Status: Active Interventions: Assess patient/caregiver ability to perform ulcer/skin care regimen upon admission and as needed Assess ulceration(s) every visit Provide education on ulcer and skin care Treatment Activities: Skin care regimen initiated : 03/08/2023 Topical wound management initiated : 03/08/2023 Notes: Electronic Signature(s) Signed: 03/15/2023 5:01:41 PM By: Karie Schwalbe RN Entered By: Karie Schwalbe on 03/15/2023 16:09:51 -------------------------------------------------------------------------------- Pain Assessment Details Patient Name: Date of Service: RAND, ETCHISON 03/15/2023 2:45 PM Medical Record Number: [id number] Patient Account Number: [id number] Date of Birth/Sex: Treating RN: 13-Feb-1932 (89 y.o. Dianna Limbo Primary Care Khamryn Calderone: Sunnie Nielsen Other Clinician: Referring Ohana Birdwell: Treating Malyna Budney/Extender: Donney Rankins Weeks in Treatment: 1 Melott, Landis Martins ([id number]) [id number] Page 4 of 6 Active Problems Location of Pain Severity and Description of Pain Patient Has Paino Yes Site Locations Pain Location: Generalized Pain With Dressing Change: No Duration of the Pain. Constant /  Intermittento Constant Rate the pain. Current Pain Level: 2 Worst Pain Level: 10 Least Pain Level: 1 Tolerable Pain Level: 5 Character of Pain Describe the Pain: Burning Pain Management and Medication Current Pain Management: Medication: Yes Cold Application: No Rest: No Massage: No Activity: No T.E.N.S.: No Heat Application: No Leg drop or elevation: No Is the Current Pain Management Adequate: Adequate How does your wound impact your activities of daily livingo Sleep: No Bathing: No Appetite: No Relationship With Others: No Bladder Continence: No Emotions: No Bowel Continence: No Work: No Toileting: No Drive: No Dressing: No Hobbies: No Electronic Signature(s) Signed: 03/15/2023 5:01:41 PM By: Karie Schwalbe RN Entered By: Karie Schwalbe on 03/15/2023 15:04:29 -------------------------------------------------------------------------------- Patient/Caregiver Education Details Patient Name: Date of Service: [name] 5/1/2024andnbsp2:45 PM Medical Record Number: [id number] Patient Account Number: [id number] Date of Birth/Gender: Treating RN: 03/09/1932 (89 y.o. Dianna Limbo Primary Care Physician: Sunnie Nielsen Other Clinician: Referring Physician: Treating Physician/Extender: Donney Rankins Weeks in Treatment: 1 Education Assessment Education Provided To: Patient Education Topics Provided Wound/Skin Impairment: Methods: Explain/Verbal Responses: Return demonstration correctly Electronic Signature(s) CASH, DUCE ([id number]) [id number] Page 5 of 6 Signed: 03/15/2023 5:01:41 PM By: Karie Schwalbe RN Entered By: Karie Schwalbe on 03/15/2023  16:10:01 -------------------------------------------------------------------------------- Wound Assessment Details Patient Name: Date of Service: SENA, CLOUATRE 03/15/2023 2:45 PM Medical Record Number: [id number] Patient Account Number: [id number] Date  of Birth/Sex: Treating RN: 06/28/1932 (89 y.o. Dianna Limbo Primary Care Evoleth Nordmeyer: Sunnie Nielsen Other Clinician: Referring Vallory Oetken: Treating Maurya Nethery/Extender: Donney Rankins Weeks in Treatment: 1 Wound Status Wound Number: 2 Primary Diabetic Wound/Ulcer of the Lower Extremity Etiology: Wound Location: Right, Medial Lower Leg Secondary Lymphedema Wounding Event: Gradually Appeared Etiology: Date Acquired: 02/05/2023 Wound Open Weeks Of Treatment: 1 Status: Clustered Wound: No Comorbid Anemia, Arrhythmia, Congestive Heart Failure, Coronary Artery History: Disease, Hypertension, Myocardial Infarction, Type II Diabetes, Gout, Neuropathy Photos Wound Measurements Length: (cm) 0.9 Width: (cm) 0.9 Depth: (cm) 0.1 Area: (cm) 0.636 Volume: (cm) 0.064 % Reduction in Area: 26.4% % Reduction in Volume: 25.6% Epithelialization: None Tunneling: No Undermining: No Wound Description Classification: Grade 1 Exudate Amount: Medium Exudate Type: Serosanguineous Exudate Color: red, brown Foul Odor After Cleansing: No Slough/Fibrino Yes Wound Bed Granulation Amount: Large (67-100%) Exposed Structure Granulation Quality: Red Fascia Exposed: No Necrotic Amount: Small (1-33%) Fat Layer (Subcutaneous Tissue) Exposed: Yes Necrotic Quality: Eschar Tendon Exposed: No Muscle Exposed: No Joint Exposed: No Bone Exposed: No Periwound Skin Texture Texture Color No Abnormalities Noted: No No Abnormalities Noted: No Scarring: Yes Hemosiderin Staining: Yes Moisture Temperature / Pain No Abnormalities Noted: Yes Temperature: No Abnormality Treatment Notes Wound #2 (Lower Leg) Wound Laterality: Right, Medial Cleanser DAMIER, DISANO ([id number]) [id number] Page 6 of 6 Soap and Water Discharge Instruction: May shower and wash wound with dial antibacterial soap and water prior to dressing change. Vashe 5.8 (oz) Discharge  Instruction: Cleanse the wound with Vashe prior to applying a clean dressing using gauze sponges, not tissue or cotton balls. Wound Cleanser Discharge Instruction: Cleanse the wound with wound cleanser prior to applying a clean dressing using gauze sponges, not tissue or cotton balls. Peri-Wound Care Sween Lotion (Moisturizing lotion) Discharge Instruction: Apply moisturizing lotion as directed Topical Primary Dressing Promogran Prisma Matrix, 4.34 (sq in) (silver collagen) Discharge Instruction: Moisten collagen with saline or hydrogel Secondary Dressing Zetuvit Plus 4x8 in Discharge Instruction: Apply over primary dressing as directed. Secured With Compression Wrap ThreePress (3 layer compression wrap) Discharge Instruction: Apply three layer compression as directed. Urgo K2 Lite, two layer compression system, regular Discharge Instruction: OR Apply Urgo K2 Lite as directed (alternative to 3 layer compression). Netting, #5 Compression Stockings Add-Ons Electronic Signature(s) Signed: 03/15/2023 5:01:41 PM By: Karie Schwalbe RN Entered By: Karie Schwalbe on 03/15/2023 15:20:10 -------------------------------------------------------------------------------- Vitals Details Patient Name: Date of Service: [name]. 03/15/2023 2:45 PM Medical Record Number: [id number] Patient Account Number: [id number] Date of Birth/Sex: Treating RN: Jul 10, 1932 (89 y.o. Dianna Limbo Primary Care Herberth Deharo: Sunnie Nielsen Other Clinician: Referring Londen Lorge: Treating Annaliah Rivenbark/Extender: Donney Rankins Weeks in Treatment: 1 Vital Signs Time Taken: 15:02 Temperature (F): 98.1 Height (in): 67 Pulse (bpm): 60 Weight (lbs): 162.2 Respiratory Rate (breaths/min): 20 Body Mass Index (BMI): 25.4 Blood Pressure (mmHg): 106/57 Reference Range: 80 - 120 mg / dl Electronic Signature(s) Signed: 03/15/2023 5:01:41 PM By: Karie Schwalbe RN Entered By: Karie Schwalbe on  03/15/2023 15:03:39

## 2023-03-16 NOTE — Telephone Encounter (Signed)
The patient has been notified of the result and verbalized understanding.  All questions (if any) were answered. Frutoso Schatz, RN 03/16/2023 9:41 AM

## 2023-03-16 NOTE — Telephone Encounter (Signed)
-----   Message from Beatrice Lecher, [place] sent at 03/16/2023  8:03 AM EDT ----- Creatinine increased since increasing Torsemide. K+ normal.  PLAN:  -Reduce Torsemide back to 40 mg in AM and 20 mg in PM. -Repeat BMET at f/u with me on 03/21/23 Tereso Newcomer, PA-C    03/16/2023 8:02 AM

## 2023-03-16 NOTE — Progress Notes (Addendum)
CHANNON, AMBROSINI ([id number]) [id number] Page 1 of 8 Visit Report for 03/15/2023 Chief Complaint Document Details Patient Name: Date of Service: [name], [name] 03/15/2023 2:45 PM Medical Record Number: [id number] Patient Account Number: [id number] Date of Birth/Sex: Treating RN: [birth year] (89 y.o. M) Primary Care Provider: Sunnie Nielsen Other Clinician: Referring Provider: Treating Provider/Extender: Donney Rankins Weeks in Treatment: 1 Information Obtained from: Patient Chief Complaint Right LE Ulcer Electronic Signature(s) Signed: 03/15/2023 2:45:08 PM By: Allen Derry PA-C Entered By: Allen Derry on 03/15/2023 14:45:08 -------------------------------------------------------------------------------- Debridement Details Patient Name: Date of Service: [name], [name] 03/15/2023 2:45 PM Medical Record Number: [id number] Patient Account Number: [id number] Date of Birth/Sex: Treating RN: 1932/04/30 (89 y.o. Dianna Limbo Primary Care Provider: Sunnie Nielsen Other Clinician: Referring Provider: Treating Provider/Extender: Donney Rankins Weeks in Treatment: 1 Debridement Performed for Assessment: Wound #2 Right,Medial Lower Leg Performed By: Physician Lenda Kelp, PA Debridement Type: Debridement Severity of Tissue Pre Debridement: Fat layer exposed Level of Consciousness (Pre-procedure): Awake and Alert Pre-procedure Verification/Time Out Yes - 15:28 Taken: Start Time: 15:28 Pain Control: Lidocaine 5% topical ointment Percent of Wound Bed Debrided: 100% T Area Debrided (cm): otal 0.64 Tissue and other material debrided: Non-Viable, Slough, Subcutaneous, Skin: Dermis , Skin: Epidermis, Slough Level: Skin/Subcutaneous Tissue Debridement Description: Excisional Instrument: Curette Bleeding: Minimum Hemostasis Achieved: Pressure End Time: 15:29 Procedural Pain: 0 Post Procedural Pain:  0 Response to Treatment: Procedure was tolerated well Level of Consciousness (Post- Awake and Alert procedure): Post Debridement Measurements of Total Wound Length: (cm) 0.9 Width: (cm) 0.9 Depth: (cm) 0.1 Volume: (cm) 0.064 Character of Wound/Ulcer Post Debridement: Improved Severity of Tissue Post Debridement: Fat layer exposed Post Procedure Diagnosis Same as Pre-procedure Notes Scribed for Allen Derry PA by [employer], Landis Martins ([id number]) [phone].pdf Page 2 of 8 Electronic Signature(s) Signed: 03/15/2023 5:01:35 PM By: Allen Derry PA-C Signed: 03/15/2023 5:01:41 PM By: Karie Schwalbe RN Entered By: Karie Schwalbe on 03/15/2023 15:31:53 -------------------------------------------------------------------------------- HPI Details Patient Name: Date of Service: Jeremiah Bookbinder. 03/15/2023 2:45 PM Medical Record Number: [id number] Patient Account Number: [id number] Date of Birth/Sex: Treating RN: 1932-09-26 (89 y.o. M) Primary Care Provider: Sunnie Nielsen Other Clinician: Referring Provider: Treating Provider/Extender: Donney Rankins Weeks in Treatment: 1 History of Present Illness HPI Description: ADMISSION 12/07/2021 This is a pleasant 89 year old man who arrives in clinic accompanied by his daughter for review of a traumatic wound on the right leg that happened on 11/18/2021. This was a result of a fall he had at home. He was seen in the ER on the same day and had 7 sutures placed. Ultimately the sutures were removed by the family physician on 1/16 however the wound dehisced. They stated that it was hard to get the original dressing off that he had not changed as well. It was felt to be infected. He was given Rocephin and subsequently doxycycline which he finished yesterday. A culture at that time showed methicillin sensitive staph aureus. He comes in the clinic today with eschar over the entire wound surface. There is  erythema around the wound but he says he had some of that even before this happened. He quite clearly has chronic venous insufficiency bilaterally. Past medical history includes basal cell CA of the skin, hypertension, chronic kidney disease, heart failure with preserved ejection fraction, coronary artery disease, carotid stenosis, atrial fib on Eliquis, history of vein stripping in the right leg, type 2 diabetes  on insulin and mitral valve regurgitation ABI in our clinic was 0.92 on the right 2/21; continued nice improvements in this wound. Healthy granulation using Hydrofera Blue under 3 lack compression. 1/31; wound looks better this week smaller still requiring debridement. We will use silver alginate under compression 2/7; wound looks better no debridement is required I changed him to [facility] today still under compression 2/14; continued nice improvement Hydrofera Blue under compression 2/28; continued improvement in wound measurements using Hydrofera Blue and compression. This wound looks like it's progressing towards closure initially a large traumatic wound in the setting of chronic venous insufficiency 01/18/2022: Continued improvement with Hydrofera Blue and compression. Minimal debridement required. 01/25/2022: Continued decrease in wound dimensions. It is very nearly closed. He has been in [facility] with 3 layer compression. 02/01/2022: Wound is closed. Readmission: 03-08-2023 upon evaluation today patient appears to be doing well currently all things considered in regard to the wound on his right leg. He has been tolerating the dressing changes without complication. Fortunately his ABI was actually 0.93 at his last visit last year and again he has leg seems to be doing quite well he tolerated compression wrap and at that point. Fortunately I do not see any signs of active infection locally or systemically which is great news. Of note the patient did see his cardiologist he was  switched from Lasix to torsemide that was just this month. He also had a DVT study ordered which was completed on 4- 18-2024 this was negative for DVT bilaterally. Overall I think this appears to be more of a venous leg ulcer that showed up just as result of him having an s overabundance of fluid collected in his leg. Patient does have a history of diabetes mellitus type 2, chronic venous hypertension, atrial fibrillation, long-term use of anticoagulant therapy, congestive heart failure, and he has had a previous myocardial infarction. 03-15-2023 upon evaluation today patient appears to be doing well currently in regard to his wound. He in fact is showing signs of excellent improvement the wrap is doing great the wound is looking better I am very pleased. Electronic Signature(s) Signed: 03/15/2023 3:33:39 PM By: Allen Derry PA-C Entered By: Allen Derry on 03/15/2023 15:33:38 -------------------------------------------------------------------------------- Physical Exam Details Patient Name: Date of Service: [name], [name] 03/15/2023 2:45 PM Medical Record Number: [id number] Patient Account Number: [id number] Date of Birth/Sex: Treating RN: Dec 20, 1931 (89 y.o. M) Primary Care Provider: Sunnie Nielsen Other Clinician: Referring Provider: Treating Provider/Extender: Hughie Closs ([id number]) [id number] Page 3 of 8 Weeks in Treatment: 1 Constitutional Well-nourished and well-hydrated in no acute distress. Respiratory normal breathing without difficulty. Psychiatric this patient is able to make decisions and demonstrates good insight into disease process. Alert and Oriented x 3. pleasant and cooperative. Notes Patient's wound bed showed signs of good granulation epithelization at this point. Fortunately I do not see any signs of active infection locally nor systemically which is great news and overall I am extremely pleased  with where things stand currently. Electronic Signature(s) Signed: 03/15/2023 3:33:55 PM By: Allen Derry PA-C Entered By: Allen Derry on 03/15/2023 15:33:55 -------------------------------------------------------------------------------- Physician Orders Details Patient Name: Date of Service: [name], [name] 03/15/2023 2:45 PM Medical Record Number: [id number] Patient Account Number: [id number] Date of Birth/Sex: Treating RN: 04/23/1932 (89 y.o. Dianna Limbo Primary Care Provider: Sunnie Nielsen Other Clinician: Referring Provider: Treating Provider/Extender: Donney Rankins Weeks in Treatment: 1 Verbal / Phone Orders: No Diagnosis  Coding ICD-10 Coding Code Description E11.622 Type 2 diabetes mellitus with other skin ulcer I87.321 Chronic venous hypertension (idiopathic) with inflammation of right lower extremity L97.812 Non-pressure chronic ulcer of other part of right lower leg with fat layer exposed I48.0 Paroxysmal atrial fibrillation Z79.01 Long term (current) use of anticoagulants I50.42 Chronic combined systolic (congestive) and diastolic (congestive) heart failure I25.10 Atherosclerotic heart disease of native coronary artery without angina pectoris Follow-up Appointments ppointment in 1 week. - Aryonna Gunnerson Room 7 Return A Anesthetic (In clinic) Topical Lidocaine 4% applied to wound bed Bathing/ Shower/ Hygiene May shower with protection but do not get wound dressing(s) wet. Protect dressing(s) with water repellant cover (for example, large plastic bag) or a cast cover and may then take shower. - Please do not get leg wraps wet. Edema Control - Lymphedema / SCD / Other Bilateral Lower Extremities Elevate legs to the level of the heart or above for 30 minutes daily and/or when sitting for 3-4 times a day throughout the day. Avoid standing for long periods of time. Patient to wear own compression stockings every day. - On left leg and when healed on the  right, wear compression on the right leg Exercise regularly - As tolerated Compression stocking or Garment 20-30 mm/Hg pressure to: - Left leg Additional Orders / Instructions Follow Nutritious Diet - Please include protein supplements. Goal is 70-100g a day. Wound Treatment Wound #2 - Lower Leg Wound Laterality: Right, Medial Cleanser: Soap and Water 1 x Per Week/30 Days Discharge Instructions: May shower and wash wound with dial antibacterial soap and water prior to dressing change. Cleanser: Vashe 5.8 (oz) 1 x Per Week/30 Days Discharge Instructions: Cleanse the wound with Vashe prior to applying a clean dressing using gauze sponges, not tissue or cotton balls. DJIBRIL, [name] ([id number]) [id number] Page 4 of 8 Cleanser: Wound Cleanser 1 x Per Week/30 Days Discharge Instructions: Cleanse the wound with wound cleanser prior to applying a clean dressing using gauze sponges, not tissue or cotton balls. Peri-Wound Care: Sween Lotion (Moisturizing lotion) 1 x Per Week/30 Days Discharge Instructions: Apply moisturizing lotion as directed Prim Dressing: Promogran Prisma Matrix, 4.34 (sq in) (silver collagen) 1 x Per Week/30 Days ary Discharge Instructions: Moisten collagen with saline or hydrogel Secondary Dressing: Zetuvit Plus 4x8 in 1 x Per Week/30 Days Discharge Instructions: Apply over primary dressing as directed. Compression Wrap: ThreePress (3 layer compression wrap) 1 x Per Week/30 Days Discharge Instructions: Apply three layer compression as directed. Compression Wrap: Urgo K2 Lite, two layer compression system, regular 1 x Per Week/30 Days Discharge Instructions: OR Apply Urgo K2 Lite as directed (alternative to 3 layer compression). Compression Wrap: Netting, #5 1 x Per Week/30 Days Electronic Signature(s) Signed: 03/15/2023 5:01:35 PM By: Allen Derry PA-C Signed: 03/15/2023 5:01:41 PM By: Karie Schwalbe RN Entered By: Karie Schwalbe on 03/15/2023  15:32:38 -------------------------------------------------------------------------------- Problem List Details Patient Name: Date of Service: Jeremiah Bookbinder. 03/15/2023 2:45 PM Medical Record Number: [id number] Patient Account Number: [id number] Date of Birth/Sex: Treating RN: [birth year] (89 y.o. M) Primary Care Provider: Sunnie Nielsen Other Clinician: Referring Provider: Treating Provider/Extender: Donney Rankins Weeks in Treatment: 1 Active Problems ICD-10 Encounter Code Description Active Date MDM Diagnosis E11.622 Type 2 diabetes mellitus with other skin ulcer 03/08/2023 No Yes I87.321 Chronic venous hypertension (idiopathic) with inflammation of right lower 03/08/2023 No Yes extremity L97.812 Non-pressure chronic ulcer of other part of right lower leg with fat layer 03/08/2023 No Yes exposed I48.0 Paroxysmal atrial fibrillation  03/08/2023 No Yes Z79.01 Long term (current) use of anticoagulants 03/08/2023 No Yes I50.42 Chronic combined systolic (congestive) and diastolic (congestive) heart failure 03/08/2023 No Yes I25.10 Atherosclerotic heart disease of native coronary artery without angina pectoris 03/08/2023 No Yes [name], [name] ([id number]) [id number] Page 5 of 8 Inactive Problems Resolved Problems Electronic Signature(s) Signed: 03/15/2023 2:44:54 PM By: Allen Derry PA-C Entered By: Allen Derry on 03/15/2023 14:44:54 -------------------------------------------------------------------------------- Progress Note Details Patient Name: Date of Service: [name], [name] 03/15/2023 2:45 PM Medical Record Number: [id number] Patient Account Number: [id number] Date of Birth/Sex: Treating RN: 10/04/1932 (89 y.o. M) Primary Care Provider: Sunnie Nielsen Other Clinician: Referring Provider: Treating Provider/Extender: Donney Rankins Weeks in Treatment: 1 Subjective Chief Complaint Information obtained from  Patient Right LE Ulcer History of Present Illness (HPI) ADMISSION 12/07/2021 This is a pleasant 89 year old man who arrives in clinic accompanied by his daughter for review of a traumatic wound on the right leg that happened on 11/18/2021. This was a result of a fall he had at home. He was seen in the ER on the same day and had 7 sutures placed. Ultimately the sutures were removed by the family physician on 1/16 however the wound dehisced. They stated that it was hard to get the original dressing off that he had not changed as well. It was felt to be infected. He was given Rocephin and subsequently doxycycline which he finished yesterday. A culture at that time showed methicillin sensitive staph aureus. He comes in the clinic today with eschar over the entire wound surface. There is erythema around the wound but he says he had some of that even before this happened. He quite clearly has chronic venous insufficiency bilaterally. Past medical history includes basal cell CA of the skin, hypertension, chronic kidney disease, heart failure with preserved ejection fraction, coronary artery disease, carotid stenosis, atrial fib on Eliquis, history of vein stripping in the right leg, type 2 diabetes on insulin and mitral valve regurgitation ABI in our clinic was 0.92 on the right 2/21; continued nice improvements in this wound. Healthy granulation using Hydrofera Blue under 3 lack compression. 1/31; wound looks better this week smaller still requiring debridement. We will use silver alginate under compression 2/7; wound looks better no debridement is required I changed him to [facility] today still under compression 2/14; continued nice improvement Hydrofera Blue under compression 2/28; continued improvement in wound measurements using Hydrofera Blue and compression. This wound looks like it's progressing towards closure initially a large traumatic wound in the setting of chronic venous  insufficiency 01/18/2022: Continued improvement with Hydrofera Blue and compression. Minimal debridement required. 01/25/2022: Continued decrease in wound dimensions. It is very nearly closed. He has been in [facility] with 3 layer compression. 02/01/2022: Wound is closed. Readmission: 03-08-2023 upon evaluation today patient appears to be doing well currently all things considered in regard to the wound on his right leg. He has been tolerating the dressing changes without complication. Fortunately his ABI was actually 0.93 at his last visit last year and again he has leg seems to be doing quite well he tolerated compression wrap and at that point. Fortunately I do not see any signs of active infection locally or systemically which is great news. Of note the patient did see his cardiologist he was switched from Lasix to torsemide that was just this month. He also had a DVT study ordered which was completed on 4- 18-2024 this was negative for DVT bilaterally. Overall I  think this appears to be more of a venous leg ulcer that showed up just as result of him having an s overabundance of fluid collected in his leg. Patient does have a history of diabetes mellitus type 2, chronic venous hypertension, atrial fibrillation, long-term use of anticoagulant therapy, congestive heart failure, and he has had a previous myocardial infarction. 03-15-2023 upon evaluation today patient appears to be doing well currently in regard to his wound. He in fact is showing signs of excellent improvement the wrap is doing great the wound is looking better I am very pleased. [address] [name], [name] ([id number]) [id number] Page 6 of 8 Constitutional Well-nourished and well-hydrated in no acute distress. Vitals Time Taken: 3:02 PM, Height: 67 in, Weight: 162.2 lbs, BMI: 25.4, Temperature: 98.1 F, Pulse: 60 bpm, Respiratory Rate: 20 breaths/min, Blood Pressure: 106/57 mmHg. Respiratory normal  breathing without difficulty. Psychiatric this patient is able to make decisions and demonstrates good insight into disease process. Alert and Oriented x 3. pleasant and cooperative. General Notes: Patient's wound bed showed signs of good granulation epithelization at this point. Fortunately I do not see any signs of active infection locally nor systemically which is great news and overall I am extremely pleased with where things stand currently. Integumentary (Hair, Skin) Wound #2 status is Open. Original cause of wound was Gradually Appeared. The date acquired was: 02/05/2023. The wound has been in treatment 1 weeks. The wound is located on the Right,Medial Lower Leg. The wound measures 0.9cm length x 0.9cm width x 0.1cm depth; 0.636cm^2 area and 0.064cm^3 volume. There is Fat Layer (Subcutaneous Tissue) exposed. There is no tunneling or undermining noted. There is a medium amount of serosanguineous drainage noted. There is large (67-100%) red granulation within the wound bed. There is a small (1-33%) amount of necrotic tissue within the wound bed including Eschar. The periwound skin appearance had no abnormalities noted for moisture. The periwound skin appearance exhibited: Scarring, Hemosiderin Staining. Periwound temperature was noted as No Abnormality. Assessment Active Problems ICD-10 Type 2 diabetes mellitus with other skin ulcer Chronic venous hypertension (idiopathic) with inflammation of right lower extremity Non-pressure chronic ulcer of other part of right lower leg with fat layer exposed Paroxysmal atrial fibrillation Long term (current) use of anticoagulants Chronic combined systolic (congestive) and diastolic (congestive) heart failure Atherosclerotic heart disease of native coronary artery without angina pectoris Procedures Wound #2 Pre-procedure diagnosis of Wound #2 is a Diabetic Wound/Ulcer of the Lower Extremity located on the Right,Medial Lower Leg .Severity of Tissue  Pre Debridement is: Fat layer exposed. There was a Excisional Skin/Subcutaneous Tissue Debridement with a total area of 0.64 sq cm performed by Lenda Kelp, PA. With the following instrument(s): Curette to remove Non-Viable tissue/material. Material removed includes Subcutaneous Tissue, Slough, Skin: Dermis, and Skin: Epidermis after achieving pain control using Lidocaine 5% topical ointment. No specimens were taken. A time out was conducted at 15:28, prior to the start of the procedure. A Minimum amount of bleeding was controlled with Pressure. The procedure was tolerated well with a pain level of 0 throughout and a pain level of 0 following the procedure. Post Debridement Measurements: 0.9cm length x 0.9cm width x 0.1cm depth; 0.064cm^3 volume. Character of Wound/Ulcer Post Debridement is improved. Severity of Tissue Post Debridement is: Fat layer exposed. Post procedure Diagnosis Wound #2: Same as Pre-Procedure General Notes: Scribed for Allen Derry PA by J.Scotton RN. Pre-procedure diagnosis of Wound #2 is a Diabetic Wound/Ulcer of the Lower Extremity located on the Right,Medial Lower  Leg . There was a Three Layer Compression Therapy Procedure by Karie Schwalbe, RN. Post procedure Diagnosis Wound #2: Same as Pre-Procedure Plan Follow-up Appointments: Return Appointment in 1 week. - Shawny Borkowski Room 7 Anesthetic: (In clinic) Topical Lidocaine 4% applied to wound bed Bathing/ Shower/ Hygiene: May shower with protection but do not get wound dressing(s) wet. Protect dressing(s) with water repellant cover (for example, large plastic bag) or a cast cover and may then take shower. - Please do not get leg wraps wet. Edema Control - Lymphedema / SCD / Other: Elevate legs to the level of the heart or above for 30 minutes daily and/or when sitting for 3-4 times a day throughout the day. Avoid standing for long periods of time. Patient to wear own compression stockings every day. - On left leg and when  healed on the right, wear compression on the right leg Exercise regularly - As tolerated Compression stocking or Garment 20-30 mm/Hg pressure to: - Left leg Additional Orders / Instructions: Follow Nutritious Diet - Please include protein supplements. Goal is 70-100g a day. WOUND #2: - Lower Leg Wound Laterality: Right, Medial [name], [name] ([id number]) [id number] Page 7 of 8 Cleanser: Soap and Water 1 x Per Week/30 Days Discharge Instructions: May shower and wash wound with dial antibacterial soap and water prior to dressing change. Cleanser: Vashe 5.8 (oz) 1 x Per Week/30 Days Discharge Instructions: Cleanse the wound with Vashe prior to applying a clean dressing using gauze sponges, not tissue or cotton balls. Cleanser: Wound Cleanser 1 x Per Week/30 Days Discharge Instructions: Cleanse the wound with wound cleanser prior to applying a clean dressing using gauze sponges, not tissue or cotton balls. Peri-Wound Care: Sween Lotion (Moisturizing lotion) 1 x Per Week/30 Days Discharge Instructions: Apply moisturizing lotion as directed Prim Dressing: Promogran Prisma Matrix, 4.34 (sq in) (silver collagen) 1 x Per Week/30 Days ary Discharge Instructions: Moisten collagen with saline or hydrogel Secondary Dressing: Zetuvit Plus 4x8 in 1 x Per Week/30 Days Discharge Instructions: Apply over primary dressing as directed. Com pression Wrap: ThreePress (3 layer compression wrap) 1 x Per Week/30 Days Discharge Instructions: Apply three layer compression as directed. Com pression Wrap: Urgo K2 Lite, two layer compression system, regular 1 x Per Week/30 Days Discharge Instructions: OR Apply Urgo K2 Lite as directed (alternative to 3 layer compression). Com pression Wrap: Netting, #5 1 x Per Week/30 Days 1. I am good recommend that we have the patient continue to monitor for any signs of infection or worsening. In general I think that the wound may continue excellent  progress. 1 thing to do is make a switch with the use collagen in place of the [facility] since [facility] is gone making things a bit too dry now and again that is a good problem to have meaning that his leg is not draining as much. Nonetheless I think we need to make sure that we are moving in the right direction from the standpoint of getting new skin growth. 2. I am good recommend as well that the patient should continue with the compression wrapping. This is going to be the 3 layer compression wrap or equivalent which is the Urgo K2 lite compression wrap. We will see patient back for reevaluation in 1 week here in the clinic. If anything worsens or changes patient will contact our office for additional recommendations. Electronic Signature(s) Signed: 03/15/2023 3:34:40 PM By: Allen Derry PA-C Entered By: Allen Derry on 03/15/2023 15:34:39 -------------------------------------------------------------------------------- SuperBill Details Patient Name: Date  of Service: [name], [name] 03/15/2023 Medical Record Number: [id number] Patient Account Number: [id number] Date of Birth/Sex: Treating RN: 12-28-1931 (89 y.o. M) Primary Care Provider: Sunnie Nielsen Other Clinician: Referring Provider: Treating Provider/Extender: Donney Rankins Weeks in Treatment: 1 Diagnosis Coding ICD-10 Codes Code Description [phone] Type 2 diabetes mellitus with other skin ulcer I87.321 Chronic venous hypertension (idiopathic) with inflammation of right lower extremity L97.812 Non-pressure chronic ulcer of other part of right lower leg with fat layer exposed I48.0 Paroxysmal atrial fibrillation Z79.01 Long term (current) use of anticoagulants I50.42 Chronic combined systolic (congestive) and diastolic (congestive) heart failure I25.10 Atherosclerotic heart disease of native coronary artery without angina pectoris Facility Procedures : 3 CPT4 Code: 9147829 Description: 11042 -  DEB SUBQ TISSUE 20 SQ CM/< ICD-10 Diagnosis Description L97.812 Non-pressure chronic ulcer of other part of right lower leg with fat layer expo Modifier: sed Quantity: 1 Physician Procedures : CPT4 Code Description Modifier [id number] 11042 - WC PHYS SUBQ TISS 20 SQ CM ICD-10 Diagnosis Description L97.812 Non-pressure chronic ulcer of other part of right lower leg with fat layer exposed Quantity: 1 Electronic Signature(s) [name], [name] ([id number]) [id number] Page 8 of 8 Signed: 03/15/2023 3:35:09 PM By: Allen Derry PA-C Entered By: Allen Derry on 03/15/2023 15:35:09

## 2023-03-17 DIAGNOSIS — I87331 Chronic venous hypertension (idiopathic) with ulcer and inflammation of right lower extremity: Secondary | ICD-10-CM | POA: Diagnosis not present

## 2023-03-17 DIAGNOSIS — I5042 Chronic combined systolic (congestive) and diastolic (congestive) heart failure: Secondary | ICD-10-CM | POA: Diagnosis not present

## 2023-03-17 DIAGNOSIS — I130 Hypertensive heart and chronic kidney disease with heart failure and stage 1 through stage 4 chronic kidney disease, or unspecified chronic kidney disease: Secondary | ICD-10-CM | POA: Diagnosis not present

## 2023-03-17 DIAGNOSIS — L97812 Non-pressure chronic ulcer of other part of right lower leg with fat layer exposed: Secondary | ICD-10-CM | POA: Diagnosis not present

## 2023-03-17 DIAGNOSIS — N183 Chronic kidney disease, stage 3 unspecified: Secondary | ICD-10-CM | POA: Diagnosis not present

## 2023-03-17 DIAGNOSIS — E1122 Type 2 diabetes mellitus with diabetic chronic kidney disease: Secondary | ICD-10-CM | POA: Diagnosis not present

## 2023-03-21 ENCOUNTER — Ambulatory Visit: Payer: Medicare Other | Attending: Physician Assistant | Admitting: Physician Assistant

## 2023-03-21 NOTE — Progress Notes (Deleted)
Cardiology Office Note:    Date:  03/21/2023  ID:  LOCH IRIBE, DOB Apr 05, 1932, MRN [id number] PCP: Joaquim Nam, MD  [facility] Providers Cardiologist:  Nanetta Batty, MD { Click to update primary MD,subspecialty MD or APP then REFRESH:1}    {Click to Open Review  :1}      Patient Profile:   S/p CABG in 1996 and redo in 2006 S/p PCI to S-OM in 05/2014 (4 x 16 mm DES, 4 x 24 mm DES) Plavix non-responder Myoview 07/12/18: EF 39, inf/inf-lat infarct w minimal peri-infarct ischemia  LHC 08/02/18: RCA ost 100, LAD ost 100, LCx ost 70, OM1 90, 60; S-RCA 100 w L-R collats, S-OM 100, S-D1 patent, L-LAD patent; PCI of LCx not successful - Med Rx  (HFrEF) heart failure with reduced ejection fraction  TTE 02/2020: EF 45-50 TTE 01/2021: EF ~50 TTE 11/24/22: EF 35-40, inf/inf-sept/septal HK, mild to mod LVH, mildly reduced RVSF, severe pulmonary HTN, RVSP 60.7, mod LAE, mild MR, s/p MitrClip, no MS, mod TR, AV calcified w mean 8.2 mmHg, RAP 3, small ASD w L-R shunt Permanent atrial fibrillation  Mitral regurgitation  S/p MitraClip 01/2020  Carotid artery disease S/p R CEA in 1994 Carotid US 01/09/23: R 1-39; L 40-59  Chronic kidney disease  Hypertension  Hyperlipidemia  Hx of GI bleed  S/p L sided hemicolectomy and sigmoidectomy OSA        History of Present Illness:   [name] is a 89 y.o. male who returns for f/u on CAD, CHF, AFib, valvular heart disease. He was seen in Jan 2024 with worsening exertional chest pain relieved by NTG and decreased exercise tolerance. Imdur was started. A f/u echocardiogram showed a decline in his LVF with EF 35-40. He had f/u with Dr. Allyson Sabal 11/30/22. His symptoms were somewhat improved. Because of his chronic kidney disease, it was decided to manage him conservatively and avoid cardiac catheterization. He was last seen 03/01/23. He was still struggling w volume overload. I changed his Furosemide to Torsemide. I referred him to the wound clinic for R  LE venous stasis ulcer. Venous US was neg for DVT. Creatinine has remained fairly stable (3.25-2.79-3.41). I reduced his Torsemide back to 40 mg in A and 20 mg in P after his SCr came back on 5/1 at 3.41.   ***  ROS ***    Studies Reviewed:    EKG:  ***  *** Risk Assessment/Calculations:   {Does this patient have ATRIAL FIBRILLATION?:[phone]} No BP recorded.  {Refresh Note OR Click here to enter BP  :1}***       Physical Exam:   VS:  There were no vitals taken for this visit.   Wt Readings from Last 3 Encounters:  03/01/23 162 lb 6.4 oz (73.7 kg)  12/16/22 165 lb 6.4 oz (75 kg)  12/06/22 155 lb (70.3 kg)    Physical Exam***    ASSESSMENT AND PLAN:   No problem-specific Assessment & Plan notes found for this encounter. ***{  CAD (coronary artery disease) History of CABG in 1996 and redo in 2006 and subsequent PCI to the vein graft to the OM in 2015.  His last cardiac catheterization was in 2019 and demonstrated an occluded vein graft to the RCA.  The vein graft to the OM was not visualized.  He had high-grade disease in the LCx and OM1 but PCI was not successful.  LIMA-LAD and vein graft to D1 were patent.  He has been managed medically.  He was recently seen with worsening symptoms of angina.  Echocardiogram demonstrated worsening LV function.  Because of his renal function, conservative management has been recommended.  He continues to have decreased exercise tolerance and occasional exertional angina.  He did have significant diuresis with metolazone in January and was feeling somewhat better when he saw Dr. Allyson Sabal.  He now has significant lower extremity edema.  I suspect volume is playing somewhat of a role in his symptoms as well as ongoing angina. Adjust diuretics as noted below Increase isosorbide to 60 mg daily Continue ASA 81 mg daily, metoprolol tartrate 50 mg twice daily, ranolazine 500 mg twice daily, rosuvastatin, as needed nitroglycerin Follow-up with Dr. Allyson Sabal in 3  months   HFrEF (heart failure with reduced ejection fraction) EF 35-40.  Ischemic cardiomyopathy.  As noted, because of his renal function and advanced age, he is being managed conservatively.  He is not a candidate for ACE/ARB/ARNI/MRA due to chronic kidney disease.  His hydralazine has previously been decreased secondary to hypotension.  He still has signs of volume overload.  His lower extremity edema is also likely worsened by venous insufficiency.  He did have significant diuresis in January with 1 dose of metolazone.  However, he felt much worse after taking this and his renal function also worsened.  We discussed changing furosemide to torsemide to see if we can get better diuresis without over diuresing him.   DC furosemide  Start torsemide 40 mg in the morning and 20 mg in the afternoon  BMET, magnesium today  Follow-up renal panel next week (results need to be faxed to Dr. Allena Katz with nephrology)  Continue metoprolol tartrate 50 mg twice daily, hydralazine 25 mg daily, Jardiance 10 mg daily.   We could consider changing metoprolol to tartrate to metoprolol succinate versus carvedilol depending upon his blood pressure. Follow-up with Dr. Allyson Sabal in 3 mos or sooner if needed.    Permanent atrial fibrillation Rate is controlled. His SCr has been > 1.5 and his age is > 75. Continue Eliquis 2.5 mg twice daily.    Non-rheumatic mitral regurgitation S/p MitraClip in 2021. Mild MR on echocardiogram in 11/2022. Continue SBE prophylaxis.    HTN (hypertension) BP somewhat elevated today. This allows for further titration of his antianginals. Increase Imdur to 60 mg once daily as noted.  Continue hydralazine 25 mg daily, metoprolol tartrate 50 mg twice daily.   CKD (chronic kidney disease) stage 4, GFR 15-29 ml/min He is followed by Dr. Allena Katz with nephrology.  He is due for follow-up labs next week.  We will draw those here to follow-up on his renal function and potassium make adjustments in his  diuretics and forward them to Dr. Allena Katz.  I reviewed his cardiac meds today.  His max dose of rosuvastatin should be 10 mg in the setting of creatinine clearance <30.  Max dose for ranolazine is 500 mg twice daily.  However, we could consider reducing ranolazine to once daily.  QTc corrected for QRS interval on his most recent EKG is acceptable.  Therefore, I think it is okay to continue ranolazine 500 mg twice daily for now.   HLD (hyperlipidemia) LDL optimal in December 2023 at 45.  However, I will reduce his rosuvastatin to 10 mg daily because of renal function.   Bilateral lower extremity edema His L leg is much larger than his R. He notes weeping on the R and this may account for the difference. He is anticoagulated with Eliquis. It would be  unusual if he developed a clot. But, b/c of the size difference, I will get a venous doppler to r/o DVT. Also, he needs his wound on his R leg managed. Obtain L lower ext venous doppler to r/o DVT Refer to wound clinic for R lower ext venous stasis ulcer    :1}     {Are you ordering a CV Procedure (e.g. stress test, cath, DCCV, TEE, etc)?   Press F2        :[id number]}  Dispo:  No follow-ups on file.  Signed, Tereso Newcomer, PA-C

## 2023-03-22 ENCOUNTER — Encounter: Payer: Self-pay | Admitting: Physician Assistant

## 2023-03-22 ENCOUNTER — Telehealth: Payer: Self-pay | Admitting: *Deleted

## 2023-03-22 ENCOUNTER — Encounter (HOSPITAL_BASED_OUTPATIENT_CLINIC_OR_DEPARTMENT_OTHER): Payer: Medicare Other | Admitting: Physician Assistant

## 2023-03-22 DIAGNOSIS — I130 Hypertensive heart and chronic kidney disease with heart failure and stage 1 through stage 4 chronic kidney disease, or unspecified chronic kidney disease: Secondary | ICD-10-CM | POA: Diagnosis not present

## 2023-03-22 DIAGNOSIS — E11622 Type 2 diabetes mellitus with other skin ulcer: Secondary | ICD-10-CM | POA: Diagnosis not present

## 2023-03-22 DIAGNOSIS — I87321 Chronic venous hypertension (idiopathic) with inflammation of right lower extremity: Secondary | ICD-10-CM | POA: Diagnosis not present

## 2023-03-22 DIAGNOSIS — Z79899 Other long term (current) drug therapy: Secondary | ICD-10-CM

## 2023-03-22 DIAGNOSIS — I480 Paroxysmal atrial fibrillation: Secondary | ICD-10-CM | POA: Diagnosis not present

## 2023-03-22 DIAGNOSIS — L97812 Non-pressure chronic ulcer of other part of right lower leg with fat layer exposed: Secondary | ICD-10-CM | POA: Diagnosis not present

## 2023-03-22 DIAGNOSIS — Z7901 Long term (current) use of anticoagulants: Secondary | ICD-10-CM | POA: Diagnosis not present

## 2023-03-22 NOTE — Telephone Encounter (Signed)
-----   Message from Beatrice Lecher, [place] sent at 03/22/2023  2:14 PM EDT ----- Pt did not come to appt yesterday. I had decreased his Torsemide and planned a repeat BMET yesterday. Can we see if he can come in for repeat BMET in the next few days? Tereso Newcomer, PA-C    03/22/2023 2:14 PM

## 2023-03-22 NOTE — Telephone Encounter (Signed)
Call placed to pt regarding his missed appointment, 03/21/23, and the need to get some lab work. Left a message for pt to call back.

## 2023-03-22 NOTE — Progress Notes (Signed)
EMILIANO, HOLLETT ([id number]) [id number] Page 1 of 7 Visit Report for 03/22/2023 Chief Complaint Document Details Patient Name: Date of Service: [name], [name] 03/22/2023 1:15 PM Medical Record Number: [id number] Patient Account Number: [id number] Date of Birth/Sex: Treating RN: [birth year] (89 y.o. M) Primary Care Provider: Sunnie Nielsen Other Clinician: Referring Provider: Treating Provider/Extender: Donney Rankins Weeks in Treatment: 2 Information Obtained from: Patient Chief Complaint Right LE Ulcer Electronic Signature(s) Signed: 03/22/2023 1:37:30 PM By: Allen Derry PA-C Entered By: Allen Derry on 03/22/2023 13:37:30 -------------------------------------------------------------------------------- HPI Details Patient Name: Date of Service: [name], [name] 03/22/2023 1:15 PM Medical Record Number: [id number] Patient Account Number: [id number] Date of Birth/Sex: Treating RN: 01/02/1932 (89 y.o. M) Primary Care Provider: Sunnie Nielsen Other Clinician: Referring Provider: Treating Provider/Extender: Donney Rankins Weeks in Treatment: 2 History of Present Illness HPI Description: ADMISSION 12/07/2021 This is a pleasant 89 year old man who arrives in clinic accompanied by his daughter for review of a traumatic wound on the right leg that happened on 11/18/2021. This was a result of a fall he had at home. He was seen in the ER on the same day and had 7 sutures placed. Ultimately the sutures were removed by the family physician on 1/16 however the wound dehisced. They stated that it was hard to get the original dressing off that he had not changed as well. It was felt to be infected. He was given Rocephin and subsequently doxycycline which he finished yesterday. A culture at that time showed methicillin sensitive staph aureus. He comes in the clinic today with eschar over the entire wound surface. There is  erythema around the wound but he says he had some of that even before this happened. He quite clearly has chronic venous insufficiency bilaterally. Past medical history includes basal cell CA of the skin, hypertension, chronic kidney disease, heart failure with preserved ejection fraction, coronary artery disease, carotid stenosis, atrial fib on Eliquis, history of vein stripping in the right leg, type 2 diabetes on insulin and mitral valve regurgitation ABI in our clinic was 0.92 on the right 2/21; continued nice improvements in this wound. Healthy granulation using Hydrofera Blue under 3 lack compression. 1/31; wound looks better this week smaller still requiring debridement. We will use silver alginate under compression 2/7; wound looks better no debridement is required I changed him to [facility] today still under compression 2/14; continued nice improvement Hydrofera Blue under compression 2/28; continued improvement in wound measurements using Hydrofera Blue and compression. This wound looks like it's progressing towards closure initially a large traumatic wound in the setting of chronic venous insufficiency 01/18/2022: Continued improvement with Hydrofera Blue and compression. Minimal debridement required. 01/25/2022: Continued decrease in wound dimensions. It is very nearly closed. He has been in [facility] with 3 layer compression. 02/01/2022: Wound is closed. Readmission: 03-08-2023 upon evaluation today patient appears to be doing well currently all things considered in regard to the wound on his right leg. He has been tolerating the dressing changes without complication. Fortunately his ABI was actually 0.93 at his last visit last year and again he has leg seems to be doing quite well he tolerated compression wrap and at that point. Fortunately I do not see any signs of active infection locally or systemically which is great news. Of note the patient did see his cardiologist he was  switched from Lasix to torsemide that was just this month. He also had a DVT study ordered which  was completed on 4- 18-2024 this was negative for DVT bilaterally. Overall I think this appears to be more of a venous leg ulcer that showed up just as result of him having an s overabundance of fluid collected in his leg. [name], [name] ([id number]) [id number] Page 2 of 7 Patient does have a history of diabetes mellitus type 2, chronic venous hypertension, atrial fibrillation, long-term use of anticoagulant therapy, congestive heart failure, and he has had a previous myocardial infarction. 03-15-2023 upon evaluation today patient appears to be doing well currently in regard to his wound. He in fact is showing signs of excellent improvement the wrap is doing great the wound is looking better I am very pleased. 03-22-2023 upon evaluation today patient appears to be doing well currently in regard to his wound. This in fact is showing signs of excellent improvement I am actually very pleased with where we stand I think is very close to complete resolution. Fortunately I do not see any signs of active infection locally nor systemically at this time which is great news. Electronic Signature(s) Signed: 03/22/2023 2:06:28 PM By: Allen Derry PA-C Entered By: Allen Derry on 03/22/2023 14:06:28 -------------------------------------------------------------------------------- Physical Exam Details Patient Name: Date of Service: [name], [name] 03/22/2023 1:15 PM Medical Record Number: [id number] Patient Account Number: [id number] Date of Birth/Sex: Treating RN: [birth year] (89 y.o. M) Primary Care Provider: Sunnie Nielsen Other Clinician: Referring Provider: Treating Provider/Extender: Donney Rankins Weeks in Treatment: 2 Constitutional Well-nourished and well-hydrated in no acute distress. Respiratory normal breathing without difficulty. Psychiatric this  patient is able to make decisions and demonstrates good insight into disease process. Alert and Oriented x 3. pleasant and cooperative. Notes Upon inspection patient's wound bed showed evidence of again being very close to complete resolution I do see a very small area of opening that is actually probably about 2 mm round but in general I think that this is for the most part new skin and healing quite nicely. Electronic Signature(s) Signed: 03/22/2023 2:06:45 PM By: Allen Derry PA-C Entered By: Allen Derry on 03/22/2023 14:06:45 -------------------------------------------------------------------------------- Physician Orders Details Patient Name: Date of Service: [name], [name] 03/22/2023 1:15 PM Medical Record Number: [id number] Patient Account Number: [id number] Date of Birth/Sex: Treating RN: 1932-05-22 (89 y.o. Dianna Limbo Primary Care Provider: Sunnie Nielsen Other Clinician: Referring Provider: Treating Provider/Extender: Donney Rankins Weeks in Treatment: 2 Verbal / Phone Orders: No Diagnosis Coding ICD-10 Coding Code Description E11.622 Type 2 diabetes mellitus with other skin ulcer I87.321 Chronic venous hypertension (idiopathic) with inflammation of right lower extremity L97.812 Non-pressure chronic ulcer of other part of right lower leg with fat layer exposed I48.0 Paroxysmal atrial fibrillation Z79.01 Long term (current) use of anticoagulants I50.42 Chronic combined systolic (congestive) and diastolic (congestive) heart failure I25.10 Atherosclerotic heart disease of native coronary artery without angina pectoris Follow-up Appointments ppointment in 1 week. - Alayjah Boehringer STONE PA Return A Anesthetic ESSEX, HABA ([id number]) [id number] Page 3 of 7 (In clinic) Topical Lidocaine 4% applied to wound bed Bathing/ Shower/ Hygiene May shower with protection but do not get wound dressing(s) wet. Protect dressing(s) with  water repellant cover (for example, large plastic bag) or a cast cover and may then take shower. - Please do not get leg wraps wet. Edema Control - Lymphedema / SCD / Other Bilateral Lower Extremities Elevate legs to the level of the heart or above for 30 minutes daily and/or when sitting for 3-4 times a day  throughout the day. Avoid standing for long periods of time. Patient to wear own compression stockings every day. - On left leg and when healed on the right, wear compression on the right leg Exercise regularly - As tolerated Compression stocking or Garment 20-30 mm/Hg pressure to: - Left leg Additional Orders / Instructions Follow Nutritious Diet - Please include protein supplements. Goal is 70-100g a day. Wound Treatment Wound #2 - Lower Leg Wound Laterality: Right, Medial Cleanser: Soap and Water 1 x Per Week/30 Days Discharge Instructions: May shower and wash wound with dial antibacterial soap and water prior to dressing change. Cleanser: Vashe 5.8 (oz) 1 x Per Week/30 Days Discharge Instructions: Cleanse the wound with Vashe prior to applying a clean dressing using gauze sponges, not tissue or cotton balls. Peri-Wound Care: Sween Lotion (Moisturizing lotion) 1 x Per Week/30 Days Discharge Instructions: Apply moisturizing lotion as directed Prim Dressing: Promogran Prisma Matrix, 4.34 (sq in) (silver collagen) 1 x Per Week/30 Days ary Discharge Instructions: Moisten collagen with saline or hydrogel Prim Dressing: Xeroform Occlusive Gauze Dressing, 4x4 in ary 1 x Per Week/30 Days Discharge Instructions: Apply over PRISMA Secondary Dressing: Zetuvit Plus 4x8 in 1 x Per Week/30 Days Discharge Instructions: Apply over primary dressing as directed. Compression Wrap: ThreePress (3 layer compression wrap) 1 x Per Week/30 Days Discharge Instructions: Apply three layer compression as directed. Compression Wrap: Urgo K2 Lite, two layer compression system, regular 1 x Per Week/30  Days Discharge Instructions: OR Apply Urgo K2 Lite as directed (alternative to 3 layer compression). Compression Wrap: Netting, #5 1 x Per Week/30 Days Electronic Signature(s) Unsigned Entered By: Karie Schwalbe on 03/22/2023 13:54:17 -------------------------------------------------------------------------------- Problem List Details Patient Name: Date of Service: [name], [name] 03/22/2023 1:15 PM Medical Record Number: [id number] Patient Account Number: [id number] Date of Birth/Sex: Treating RN: 08-21-1932 (89 y.o. M) Primary Care Provider: Sunnie Nielsen Other Clinician: Referring Provider: Treating Provider/Extender: Donney Rankins Weeks in Treatment: 2 Active Problems ICD-10 Encounter Code Description Active Date MDM Diagnosis E11.622 Type 2 diabetes mellitus with other skin ulcer 03/08/2023 No Yes I87.321 Chronic venous hypertension (idiopathic) with inflammation of right lower 03/08/2023 No Yes extremity MUSE, [place] ([id number]) [id number] Page 4 of 7 [phone] Non-pressure chronic ulcer of other part of right lower leg with fat layer 03/08/2023 No Yes exposed I48.0 Paroxysmal atrial fibrillation 03/08/2023 No Yes Z79.01 Long term (current) use of anticoagulants 03/08/2023 No Yes I50.42 Chronic combined systolic (congestive) and diastolic (congestive) heart failure 03/08/2023 No Yes I25.10 Atherosclerotic heart disease of native coronary artery without angina pectoris 03/08/2023 No Yes Inactive Problems Resolved Problems Electronic Signature(s) Signed: 03/22/2023 1:37:17 PM By: Allen Derry PA-C Entered By: Allen Derry on 03/22/2023 13:37:17 -------------------------------------------------------------------------------- Progress Note Details Patient Name: Date of Service: [name], [name] 03/22/2023 1:15 PM Medical Record Number: [id number] Patient Account Number: [id number] Date of Birth/Sex: Treating RN: [birth year] (89  y.o. M) Primary Care Provider: Sunnie Nielsen Other Clinician: Referring Provider: Treating Provider/Extender: Donney Rankins Weeks in Treatment: 2 Subjective Chief Complaint Information obtained from Patient Right LE Ulcer History of Present Illness (HPI) ADMISSION 12/07/2021 This is a pleasant 89 year old man who arrives in clinic accompanied by his daughter for review of a traumatic wound on the right leg that happened on 11/18/2021. This was a result of a fall he had at home. He was seen in the ER on the same day and had 7 sutures placed. Ultimately the sutures were removed by the family physician  on 1/16 however the wound dehisced. They stated that it was hard to get the original dressing off that he had not changed as well. It was felt to be infected. He was given Rocephin and subsequently doxycycline which he finished yesterday. A culture at that time showed methicillin sensitive staph aureus. He comes in the clinic today with eschar over the entire wound surface. There is erythema around the wound but he says he had some of that even before this happened. He quite clearly has chronic venous insufficiency bilaterally. Past medical history includes basal cell CA of the skin, hypertension, chronic kidney disease, heart failure with preserved ejection fraction, coronary artery disease, carotid stenosis, atrial fib on Eliquis, history of vein stripping in the right leg, type 2 diabetes on insulin and mitral valve regurgitation ABI in our clinic was 0.92 on the right 2/21; continued nice improvements in this wound. Healthy granulation using Hydrofera Blue under 3 lack compression. 1/31; wound looks better this week smaller still requiring debridement. We will use silver alginate under compression 2/7; wound looks better no debridement is required I changed him to [facility] today still under compression 2/14; continued nice improvement Hydrofera Blue under  compression 2/28; continued improvement in wound measurements using Hydrofera Blue and compression. This wound looks like it's progressing towards closure initially a large traumatic wound in the setting of chronic venous insufficiency 01/18/2022: Continued improvement with Hydrofera Blue and compression. Minimal debridement required. [name], [name] ([id number]) [id number] Page 5 of 7 01/25/2022: Continued decrease in wound dimensions. It is very nearly closed. He has been in [facility] with 3 layer compression. 02/01/2022: Wound is closed. Readmission: 03-08-2023 upon evaluation today patient appears to be doing well currently all things considered in regard to the wound on his right leg. He has been tolerating the dressing changes without complication. Fortunately his ABI was actually 0.93 at his last visit last year and again he has leg seems to be doing quite well he tolerated compression wrap and at that point. Fortunately I do not see any signs of active infection locally or systemically which is great news. Of note the patient did see his cardiologist he was switched from Lasix to torsemide that was just this month. He also had a DVT study ordered which was completed on 4- 18-2024 this was negative for DVT bilaterally. Overall I think this appears to be more of a venous leg ulcer that showed up just as result of him having an s overabundance of fluid collected in his leg. Patient does have a history of diabetes mellitus type 2, chronic venous hypertension, atrial fibrillation, long-term use of anticoagulant therapy, congestive heart failure, and he has had a previous myocardial infarction. 03-15-2023 upon evaluation today patient appears to be doing well currently in regard to his wound. He in fact is showing signs of excellent improvement the wrap is doing great the wound is looking better I am very pleased. 03-22-2023 upon evaluation today patient appears to be  doing well currently in regard to his wound. This in fact is showing signs of excellent improvement I am actually very pleased with where we stand I think is very close to complete resolution. Fortunately I do not see any signs of active infection locally nor systemically at this time which is great news. Objective Constitutional Well-nourished and well-hydrated in no acute distress. Vitals Time Taken: 1:35 PM, Height: 67 in, Weight: 162.2 lbs, BMI: 25.4, Temperature: 98.0 F, Pulse: 69 bpm, Respiratory Rate: 18 breaths/min, Blood Pressure:  111/64 mmHg, Capillary Blood Glucose: 109 mg/dl. Respiratory normal breathing without difficulty. Psychiatric this patient is able to make decisions and demonstrates good insight into disease process. Alert and Oriented x 3. pleasant and cooperative. General Notes: Upon inspection patient's wound bed showed evidence of again being very close to complete resolution I do see a very small area of opening that is actually probably about 2 mm round but in general I think that this is for the most part new skin and healing quite nicely. Integumentary (Hair, Skin) Wound #2 status is Open. Original cause of wound was Gradually Appeared. The date acquired was: 02/05/2023. The wound has been in treatment 2 weeks. The wound is located on the Right,Medial Lower Leg. The wound measures 0.5cm length x 0.5cm width x 0.1cm depth; 0.196cm^2 area and 0.02cm^3 volume. There is Fat Layer (Subcutaneous Tissue) exposed. There is no tunneling or undermining noted. There is a medium amount of serosanguineous drainage noted. There is large (67-100%) red, hyper - granulation within the wound bed. There is a small (1-33%) amount of necrotic tissue within the wound bed including Eschar. The periwound skin appearance had no abnormalities noted for moisture. The periwound skin appearance exhibited: Scarring, Hemosiderin Staining. Periwound temperature was noted as No  Abnormality. 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 - [name] STONE PA Anesthetic: (In clinic) Topical Lidocaine 4% applied to wound bed Bathing/ Shower/ Hygiene: May shower with protection but do not get wound dressing(s) wet. Protect dressing(s) with water repellant cover (for example, large plastic bag) or a cast cover [name], [name] ([id number]) [id number] Page 6 of 7 and may then take shower. - Please do not get leg wraps wet. Edema Control - Lymphedema / SCD / Other: Elevate legs to the level of the heart or above for 30 minutes daily and/or when sitting for 3-4 times a day throughout the day. Avoid standing for long periods of time. Patient to wear own compression stockings every day. - On left leg and when healed on the right, wear compression on the right leg Exercise regularly - As tolerated Compression stocking or Garment 20-30 mm/Hg pressure to: - Left leg Additional Orders / Instructions: Follow Nutritious Diet - Please include protein supplements. Goal is 70-100g a day. WOUND #2: - Lower Leg Wound Laterality: Right, Medial Cleanser: Soap and Water 1 x Per Week/30 Days Discharge Instructions: May shower and wash wound with dial antibacterial soap and water prior to dressing change. Cleanser: Vashe 5.8 (oz) 1 x Per Week/30 Days Discharge Instructions: Cleanse the wound with Vashe prior to applying a clean dressing using gauze sponges, not tissue or cotton  balls. Peri-Wound Care: Sween Lotion (Moisturizing lotion) 1 x Per Week/30 Days Discharge Instructions: Apply moisturizing lotion as directed Prim Dressing: Promogran Prisma Matrix, 4.34 (sq in) (silver collagen) 1 x Per Week/30 Days ary Discharge Instructions: Moisten collagen with saline or hydrogel Prim Dressing: Xeroform Occlusive Gauze Dressing, 4x4 in 1 x Per Week/30 Days ary Discharge Instructions: Apply over PRISMA Secondary Dressing: Zetuvit Plus 4x8 in 1 x Per Week/30 Days Discharge Instructions: Apply over primary dressing as directed. Com pression Wrap: ThreePress (  3 layer compression wrap) 1 x Per Week/30 Days Discharge Instructions: Apply three layer compression as directed. Com pression Wrap: Urgo K2 Lite, two layer compression system, regular 1 x Per Week/30 Days Discharge Instructions: OR Apply Urgo K2 Lite as directed (alternative to 3 layer compression). Com pression Wrap: Netting, #5 1 x Per Week/30 Days 1. I would recommend that we have the patient continue with the wound care measures as before this includes the use of the silver collagen on the regular use some Xeroform over top to keep this from drying out. 2. I am also can recommend that we have the patient continue with the Zetuvit to cover followed by the Urgo K2 lite compression wrap. We will see patient back for reevaluation in 1 week here in the clinic. If anything worsens or changes patient will contact our office for additional recommendations. Electronic Signature(s) Signed: 03/22/2023 2:07:11 PM By: Allen Derry PA-C Entered By: Allen Derry on 03/22/2023 14:07:11 -------------------------------------------------------------------------------- SuperBill Details Patient Name: Date of Service: [name], [name] 03/22/2023 Medical Record Number: [id number] Patient Account Number: [id number] Date of Birth/Sex: Treating RN: 04-14-1932 (89 y.o. M) Primary Care Provider: Sunnie Nielsen Other Clinician: Referring  Provider: Treating Provider/Extender: Donney Rankins Weeks in Treatment: 2 Diagnosis Coding ICD-10 Codes Code Description [phone] Type 2 diabetes mellitus with other skin ulcer I87.321 Chronic venous hypertension (idiopathic) with inflammation of right lower extremity L97.812 Non-pressure chronic ulcer of other part of right lower leg with fat layer exposed I48.0 Paroxysmal atrial fibrillation Z79.01 Long term (current) use of anticoagulants I50.42 Chronic combined systolic (congestive) and diastolic (congestive) heart failure I25.10 Atherosclerotic heart disease of native coronary artery without angina pectoris Facility Procedures : 3 CPT4 Code: [id number] Description: (Facility Use Only) [phone] - APPLY MULTLAY COMPRS LWR RT LEG ICD-10 Diagnosis Description L97.812 Non-pressure chronic ulcer of other part of right lower leg with fat layer exposed Modifier: Quantity: 1 Physician Procedures : CPT4 Code Description Modifier [id number] - WC PHYS LEVEL 3 - EST PT JACARY, REFFNER ([id number]) [id number] Pag ICD-10 Diagnosis Description E11.622 Type 2 diabetes mellitus with other skin ulcer I87.321 Chronic venous  hypertension (idiopathic) with inflammation of right lower extremity L97.812 Non-pressure chronic ulcer of other part of right lower leg with fat layer exposed I48.0 Paroxysmal atrial fibrillation Quantity: 1 e 7 of 7 Electronic Signature(s) Unsigned Previous Signature: 03/22/2023 2:07:33 PM Version By: Allen Derry PA-C Entered By: Karie Schwalbe on 03/22/2023 15:39:05 Signature(s): Date(s):

## 2023-03-23 ENCOUNTER — Ambulatory Visit: Payer: Medicare Other | Attending: Physician Assistant

## 2023-03-23 DIAGNOSIS — Z79899 Other long term (current) drug therapy: Secondary | ICD-10-CM

## 2023-03-23 LAB — BASIC METABOLIC PANEL
BUN/Creatinine Ratio: 13 (ref 10–24)
BUN: 42 mg/dL — ABNORMAL HIGH (ref 8–27)
CO2: 27 mmol/L (ref 20–29)
Calcium: 9.5 mg/dL (ref 8.6–10.2)
Chloride: 98 mmol/L (ref 96–106)
Creatinine, Ser: 3.16 mg/dL — ABNORMAL HIGH (ref 0.76–1.27)
Glucose: 196 mg/dL — ABNORMAL HIGH (ref 70–99)
Potassium: 4.4 mmol/L (ref 3.5–5.2)
Sodium: 140 mmol/L (ref 134–144)
eGFR: 18 mL/min/{1.73_m2} — ABNORMAL LOW (ref >59)

## 2023-03-23 NOTE — Progress Notes (Signed)
LAVONTAE, SATHER ([id number]) [id number] Page 1 of 6 Visit Report for 03/22/2023 Arrival Information Details Patient Name: Date of Service: RASHEED, KARGES 03/22/2023 1:15 PM Medical Record Number: [id number] Patient Account Number: [id number] Date of Birth/Sex: Treating RN: Feb 13, 1932 (89 y.o. Cline Cools Primary Care Marcos Peloso: Sunnie Nielsen Other Clinician: Referring Graylyn Bunney: Treating Gorje Iyer/Extender: Donney Rankins Weeks in Treatment: 2 Visit Information History Since Last Visit Added or deleted any medications: No Patient Arrived: Gilmer Mor Any new allergies or adverse reactions: No Arrival Time: 13:37 Had a fall or experienced change in No Accompanied By: self activities of daily living that may affect Transfer Assistance: None risk of falls: Patient Identification Verified: Yes Signs or symptoms of abuse/neglect since last visito No Secondary Verification Process Completed: Yes Hospitalized since last visit: No Patient Requires Transmission-Based Precautions: No Implantable device outside of the clinic excluding No Patient Has Alerts: Yes cellular tissue based products placed in the center Patient Alerts: Patient on Blood Thinner since last visit: eliquis Has Dressing in Place as Prescribed: Yes ABI R 0.93 3/23 Has Compression in Place as Prescribed: Yes Pain Present Now: No Electronic Signature(s) Signed: 03/22/2023 4:03:05 PM By: [name] Pulling RN, [name] Entered By: [name] Pulling on 03/22/2023 13:37:57 -------------------------------------------------------------------------------- Compression Therapy Details Patient Name: Date of Service: [name]. 03/22/2023 1:15 PM Medical Record Number: [id number] Patient Account Number: [id number] Date of Birth/Sex: Treating RN: Dec 04, 1931 (89 y.o. Dianna Limbo Primary Care Wafa Martes: Sunnie Nielsen Other Clinician: Referring Paisyn Guercio: Treating Caesar Mannella/Extender:  Donney Rankins Weeks in Treatment: 2 Compression Therapy Performed for Wound Assessment: Wound #2 Right,Medial Lower Leg Performed By: Clinician Karie Schwalbe, RN Compression Type: Three Layer Post Procedure Diagnosis Same as Pre-procedure Electronic Signature(s) Signed: 03/22/2023 4:46:50 PM By: Karie Schwalbe RN Entered By: Karie Schwalbe on 03/22/2023 14:41:24 -------------------------------------------------------------------------------- Encounter Discharge Information Details Patient Name: Date of Service: KENJUAN, HRIVNAK 03/22/2023 1:15 PM Medical Record Number: [id number] Patient Account Number: [id number] Date of Birth/Sex: Treating RN: 07/02/1932 (89 y.o. Dianna Limbo Primary Care Taleyah Hillman: Sunnie Nielsen Other Clinician: Referring Dreshon Proffit: Treating Charline Hoskinson/Extender: Donney Rankins Weeks in Treatment: 2 Encounter Discharge Information Items Discharge Condition: Stable Ambulatory Status: [facility] Discharge Destination: Home Transportation: [address] [place], [place] ([id number]) [id number] Page 2 of 6 Accompanied By: son Schedule Follow-up Appointment: Yes Clinical Summary of Care: Patient Declined Electronic Signature(s) Signed: 03/22/2023 4:46:50 PM By: Karie Schwalbe RN Entered By: Karie Schwalbe on 03/22/2023 15:39:48 -------------------------------------------------------------------------------- Lower Extremity Assessment Details Patient Name: Date of Service: LASHAN, SEGNER 03/22/2023 1:15 PM Medical Record Number: [id number] Patient Account Number: [id number] Date of Birth/Sex: Treating RN: 1932/09/09 (89 y.o. Cline Cools Primary Care Cambell Stanek: Sunnie Nielsen Other Clinician: Referring Dublin Cantero: Treating Beverlyn Mcginness/Extender: Donney Rankins Weeks in Treatment: 2 Edema Assessment Assessed: [Left: No] [Right: No] Edema: [Left: Ye] [Right:  s] Calf Left: Right: Point of Measurement: 28 cm From Medial Instep 27.3 cm Ankle Left: Right: Point of Measurement: 10 cm From Medial Instep 21 cm Vascular Assessment Pulses: Dorsalis Pedis Palpable: [Right:Yes] Electronic Signature(s) Signed: 03/22/2023 4:03:05 PM By: [name] Pulling RN, [name] Entered By: [name] Pulling on 03/22/2023 13:39:05 -------------------------------------------------------------------------------- Multi-Disciplinary Care Plan Details Patient Name: Date of Service: [name]. 03/22/2023 1:15 PM Medical Record Number: [id number] Patient Account Number: [id number] Date of Birth/Sex: Treating RN: 01/30/1932 (89 y.o. Dianna Limbo Primary Care Ameah Chanda: Sunnie Nielsen Other Clinician: Referring Jayke Caul: Treating Trentan Trippe/Extender: Bernita Buffy,  Charline Bills Weeks in Treatment: 2 Active Inactive Orientation to the Wound Care Program Nursing Diagnoses: Knowledge deficit related to the wound healing center program Goals: Patient/caregiver will verbalize understanding of the Wound Healing Center Program Date Initiated: 03/08/2023 Target Resolution Date: 04/24/2023 Goal Status: Active Interventions: Provide education on orientation to the wound center MCCRAY, ANGERMEIER ([id number]) [id number] Page 3 of 6 Notes: Pain, Acute or Chronic Nursing Diagnoses: Pain, acute or chronic: actual or potential Potential alteration in comfort, pain Goals: Patient will verbalize adequate pain control and receive pain control interventions during procedures as needed Date Initiated: 03/08/2023 Target Resolution Date: 04/25/2023 Goal Status: Active Patient/caregiver will verbalize comfort level met Date Initiated: 03/08/2023 Target Resolution Date: 04/23/2023 Goal Status: Active Interventions: Encourage patient to take pain medications as prescribed Provide education on pain management Treatment Activities: Administer pain  control measures as ordered : 03/08/2023 Notes: Venous Leg Ulcer Nursing Diagnoses: Knowledge deficit related to disease process and management Goals: Patient will maintain optimal edema control Date Initiated: 03/08/2023 Target Resolution Date: 04/23/2023 Goal Status: Active Interventions: Assess peripheral edema status every visit. Provide education on venous insufficiency Notes: Wound/Skin Impairment Nursing Diagnoses: Knowledge deficit related to ulceration/compromised skin integrity Goals: Patient/caregiver will verbalize understanding of skin care regimen Date Initiated: 03/08/2023 Target Resolution Date: 04/24/2023 Goal Status: Active Interventions: Assess patient/caregiver ability to perform ulcer/skin care regimen upon admission and as needed Assess ulceration(s) every visit Provide education on ulcer and skin care Treatment Activities: Skin care regimen initiated : 03/08/2023 Topical wound management initiated : 03/08/2023 Notes: Electronic Signature(s) Signed: 03/22/2023 4:46:50 PM By: Karie Schwalbe RN Entered By: Karie Schwalbe on 03/22/2023 15:37:56 -------------------------------------------------------------------------------- Pain Assessment Details Patient Name: Date of Service: ESTLE, GILFORD 03/22/2023 1:15 PM Medical Record Number: [id number] Patient Account Number: [id number] Date of Birth/Sex: Treating RN: 1932/10/05 (89 y.o. Cline Cools Primary Care Ventura Hollenbeck: Sunnie Nielsen Other Clinician: Referring Abdimalik Mayorquin: Treating Grayden Burley/Extender: Donney Rankins Weeks in Treatment: 2 Haymaker, Landis Martins ([id number]) [id number] Page 4 of 6 Active Problems Location of Pain Severity and Description of Pain Patient Has Paino No Site Locations Pain Management and Medication Current Pain Management: Electronic Signature(s) Signed: 03/22/2023 4:03:05 PM By: [name] Pulling RN, [name] Entered By: [name] Pulling on  03/22/2023 13:38:28 -------------------------------------------------------------------------------- Patient/Caregiver Education Details Patient Name: Date of Service: [name] 5/8/2024andnbsp1:15 PM Medical Record Number: [id number] Patient Account Number: [id number] Date of Birth/Gender: Treating RN: 1931-12-03 (89 y.o. Dianna Limbo Primary Care Physician: Sunnie Nielsen Other Clinician: Referring Physician: Treating Physician/Extender: Donney Rankins Weeks in Treatment: 2 Education Assessment Education Provided To: Patient Education Topics Provided Wound/Skin Impairment: Methods: Explain/Verbal Responses: Return demonstration correctly Electronic Signature(s) Signed: 03/22/2023 4:46:50 PM By: Karie Schwalbe RN Entered By: Karie Schwalbe on 03/22/2023 15:38:09 -------------------------------------------------------------------------------- Wound Assessment Details Patient Name: Date of Service: JAXX, TLATELPA 03/22/2023 1:15 PM Medical Record Number: [id number] Patient Account Number: [id number] Date of Birth/Sex: Treating RN: 05-23-1932 (89 y.o. Cline Cools Primary Care Naiah Donahoe: Sunnie Nielsen Other Clinician: Referring Mckinlee Dunk: Treating Najat Olazabal/Extender: Donney Rankins Weeks in Treatment: 2 Wound Status MAKARI, ROPER ([id number]) [id number] Page 5 of 6 Wound Number: 2 Primary Diabetic Wound/Ulcer of the Lower Extremity Etiology: Wound Location: Right, Medial Lower Leg Secondary Lymphedema Wounding Event: Gradually Appeared Etiology: Date Acquired: 02/05/2023 Wound Open Weeks Of Treatment: 2 Status: Clustered Wound: No Comorbid Anemia, Arrhythmia, Congestive Heart Failure, Coronary Artery History: Disease, Hypertension, Myocardial Infarction, Type II Diabetes, Gout, Neuropathy Photos  Wound Measurements Length: (cm) 0.5 Width: (cm) 0.5 Depth: (cm) 0.1 Area: (cm)  0.196 Volume: (cm) 0.02 % Reduction in Area: 77.3% % Reduction in Volume: 76.7% Epithelialization: None Tunneling: No Undermining: No Wound Description Classification: Grade 1 Exudate Amount: Medium Exudate Type: Serosanguineous Exudate Color: red, brown Foul Odor After Cleansing: No Slough/Fibrino Yes Wound Bed Granulation Amount: Large (67-100%) Exposed Structure Granulation Quality: Red, Hyper-granulation Fascia Exposed: No Necrotic Amount: Small (1-33%) Fat Layer (Subcutaneous Tissue) Exposed: Yes Necrotic Quality: Eschar Tendon Exposed: No Muscle Exposed: No Joint Exposed: No Bone Exposed: No Periwound Skin Texture Texture Color No Abnormalities Noted: No No Abnormalities Noted: No Scarring: Yes Hemosiderin Staining: Yes Moisture Temperature / Pain No Abnormalities Noted: Yes Temperature: No Abnormality Treatment Notes Wound #2 (Lower Leg) Wound Laterality: Right, Medial Cleanser Soap and Water Discharge Instruction: May shower and wash wound with dial antibacterial soap and water prior to dressing change. Vashe 5.8 (oz) Discharge Instruction: Cleanse the wound with Vashe prior to applying a clean dressing using gauze sponges, not tissue or cotton balls. Peri-Wound Care Sween Lotion (Moisturizing lotion) Discharge Instruction: Apply moisturizing lotion as directed Topical Primary Dressing Promogran Prisma Matrix, 4.34 (sq in) (silver collagen) Discharge Instruction: Moisten collagen with saline or hydrogel Xeroform Occlusive Gauze Dressing, 4x4 in TADD, HNAT ([id number]) [id number] Page 6 of 6 Discharge Instruction: Apply over PRISMA Secondary Dressing Zetuvit Plus 4x8 in Discharge Instruction: Apply over primary dressing as directed. Secured With Compression Wrap ThreePress (3 layer compression wrap) Discharge Instruction: Apply three layer compression as directed. Urgo K2 Lite, two layer compression system,  regular Discharge Instruction: OR Apply Urgo K2 Lite as directed (alternative to 3 layer compression). Netting, #5 Compression Stockings Add-Ons Electronic Signature(s) Signed: 03/22/2023 4:03:05 PM By: [name] Pulling RN, [name] Signed: 03/22/2023 4:46:50 PM By: Karie Schwalbe RN Entered By: Karie Schwalbe on 03/22/2023 13:41:09 -------------------------------------------------------------------------------- Vitals Details Patient Name: Date of Service: MAXIMO, RAINERI 03/22/2023 1:15 PM Medical Record Number: [id number] Patient Account Number: [id number] Date of Birth/Sex: Treating RN: 14-May-1932 (89 y.o. Cline Cools Primary Care Geraldin Habermehl: Sunnie Nielsen Other Clinician: Referring Ameia Morency: Treating Larina Lieurance/Extender: Donney Rankins Weeks in Treatment: 2 Vital Signs Time Taken: 13:35 Temperature (F): 98.0 Height (in): 67 Pulse (bpm): 69 Weight (lbs): 162.2 Respiratory Rate (breaths/min): 18 Body Mass Index (BMI): 25.4 Blood Pressure (mmHg): 111/64 Capillary Blood Glucose (mg/dl): 782 Reference Range: 80 - 120 mg / dl Electronic Signature(s) Signed: 03/22/2023 4:03:05 PM By: [name] Pulling RN, [name] Entered By: [name] Pulling on 03/22/2023 13:38:21

## 2023-03-24 ENCOUNTER — Telehealth: Payer: Self-pay | Admitting: Cardiovascular Disease

## 2023-03-24 ENCOUNTER — Ambulatory Visit (HOSPITAL_BASED_OUTPATIENT_CLINIC_OR_DEPARTMENT_OTHER): Payer: TRICARE For Life (TFL) | Admitting: General Surgery

## 2023-03-24 DIAGNOSIS — I130 Hypertensive heart and chronic kidney disease with heart failure and stage 1 through stage 4 chronic kidney disease, or unspecified chronic kidney disease: Secondary | ICD-10-CM | POA: Diagnosis not present

## 2023-03-24 DIAGNOSIS — I5042 Chronic combined systolic (congestive) and diastolic (congestive) heart failure: Secondary | ICD-10-CM | POA: Diagnosis not present

## 2023-03-24 DIAGNOSIS — N183 Chronic kidney disease, stage 3 unspecified: Secondary | ICD-10-CM | POA: Diagnosis not present

## 2023-03-24 DIAGNOSIS — L97812 Non-pressure chronic ulcer of other part of right lower leg with fat layer exposed: Secondary | ICD-10-CM | POA: Diagnosis not present

## 2023-03-24 DIAGNOSIS — E1122 Type 2 diabetes mellitus with diabetic chronic kidney disease: Secondary | ICD-10-CM | POA: Diagnosis not present

## 2023-03-24 DIAGNOSIS — I87331 Chronic venous hypertension (idiopathic) with ulcer and inflammation of right lower extremity: Secondary | ICD-10-CM | POA: Diagnosis not present

## 2023-03-24 NOTE — Telephone Encounter (Signed)
Spoke with Amy with [facility] and noted pt coughing with course sounds left sided did clear up with cough Per pt stated has had productive cough for 1 month  Pt's weight is stable Will forward to Dr Allyson Sabal for review  Amy suggested maybe getting CXR over the weekend  Also pt is taking OTC Potassium supplement  K is stable

## 2023-03-24 NOTE — Telephone Encounter (Signed)
Pt c/o medication issue:  1. Name of Medication:   Potassium Gluconate 595 MG CAPS   2. How are you currently taking this medication (dosage and times per day)?  Patient has been taking 1 a day  3. Are you having a reaction (difficulty breathing--STAT)?   4. What is your medication issue?    Caller wants call back to clarify patient's Potassium Gluconate 595 MG CAPS and states patient has had some coarse sounds and crackles in his lung on his left side and has been having a productive cough.

## 2023-03-29 ENCOUNTER — Encounter (HOSPITAL_BASED_OUTPATIENT_CLINIC_OR_DEPARTMENT_OTHER): Payer: Medicare Other | Admitting: Physician Assistant

## 2023-03-29 DIAGNOSIS — I130 Hypertensive heart and chronic kidney disease with heart failure and stage 1 through stage 4 chronic kidney disease, or unspecified chronic kidney disease: Secondary | ICD-10-CM | POA: Diagnosis not present

## 2023-03-29 DIAGNOSIS — E11622 Type 2 diabetes mellitus with other skin ulcer: Secondary | ICD-10-CM | POA: Diagnosis not present

## 2023-03-29 DIAGNOSIS — L97812 Non-pressure chronic ulcer of other part of right lower leg with fat layer exposed: Secondary | ICD-10-CM | POA: Diagnosis not present

## 2023-03-29 DIAGNOSIS — I87321 Chronic venous hypertension (idiopathic) with inflammation of right lower extremity: Secondary | ICD-10-CM | POA: Diagnosis not present

## 2023-03-29 DIAGNOSIS — Z7901 Long term (current) use of anticoagulants: Secondary | ICD-10-CM | POA: Diagnosis not present

## 2023-03-29 DIAGNOSIS — I480 Paroxysmal atrial fibrillation: Secondary | ICD-10-CM | POA: Diagnosis not present

## 2023-03-30 NOTE — Telephone Encounter (Signed)
Patient states he already got the lab work done and does not feel he needs to reschedule his appointment unless the office feels it is necessary. He says he is feeling better.

## 2023-03-31 DIAGNOSIS — I5042 Chronic combined systolic (congestive) and diastolic (congestive) heart failure: Secondary | ICD-10-CM | POA: Diagnosis not present

## 2023-03-31 DIAGNOSIS — N183 Chronic kidney disease, stage 3 unspecified: Secondary | ICD-10-CM | POA: Diagnosis not present

## 2023-03-31 DIAGNOSIS — L97812 Non-pressure chronic ulcer of other part of right lower leg with fat layer exposed: Secondary | ICD-10-CM | POA: Diagnosis not present

## 2023-03-31 DIAGNOSIS — I130 Hypertensive heart and chronic kidney disease with heart failure and stage 1 through stage 4 chronic kidney disease, or unspecified chronic kidney disease: Secondary | ICD-10-CM | POA: Diagnosis not present

## 2023-03-31 DIAGNOSIS — E1122 Type 2 diabetes mellitus with diabetic chronic kidney disease: Secondary | ICD-10-CM | POA: Diagnosis not present

## 2023-03-31 DIAGNOSIS — I87331 Chronic venous hypertension (idiopathic) with ulcer and inflammation of right lower extremity: Secondary | ICD-10-CM | POA: Diagnosis not present

## 2023-03-31 NOTE — Telephone Encounter (Signed)
Jeremiah Gess, MD  Alois Cliche, LPN7 days ago    Should probably follow-up with her PCP, Dr. Crawford Givens further evaluate this.   Spoke with Amy regarding Dr. Hazle Coca recommendations.   She mentions that she saw him today and his lungs were clear and cough had cleared up. No additional needs at this time. Pt told Amy that he feels much better.

## 2023-04-03 ENCOUNTER — Telehealth: Payer: Self-pay | Admitting: Family Medicine

## 2023-04-03 ENCOUNTER — Telehealth: Payer: Self-pay

## 2023-04-03 NOTE — Telephone Encounter (Signed)
Home Health verbal orders Caller Name: Amy Agency Name: [facility]  Callback number: [phone]  Requesting PT evaluation  Reason: due to decrease in endurance  Please forward to [facility] pool or providers CMA

## 2023-04-03 NOTE — Progress Notes (Signed)
Care Management & Coordination Services Pharmacy Team  Reason for Encounter: Appointment Reminder  Contacted patient to confirm telephone appointment with [name], PharmD on 04/06/2023 at 11:00.  Spoke with patient on 04/03/2023   Do you have any problems getting your medications? No  What is your top health concern you would like to discuss at your upcoming visit? Not at this time; he is going to the wound center Wednesday to remove his wrap.   Have you seen any other providers since your last visit with PCP? Yes  Star Rating Drugs:  Medication:  Last Fill: Day Supply Jardiance 10 mg 01/03/2023 90 Rosuvastatin 10 mg 03/01/2023 90  Care Gaps: Annual wellness visit in last year? Yes 11/01/2022  If Diabetic: Last eye exam / retinopathy screening: Overdue Last diabetic foot exam: Up to date  [name], PharmD notified  [name], [place] Clinical Pharmacy Assistant [phone]

## 2023-04-04 NOTE — Telephone Encounter (Signed)
Please give the order.  Thanks.  Please schedule f/u here with  A1c at the visit.

## 2023-04-04 NOTE — Telephone Encounter (Signed)
Verbal order given to Amy by telephone as instructed and she verbalized understanding.  Called and spoke to patient appointment scheduled 04/13/23 with Dr. Para March at 12:30 pm.

## 2023-04-05 ENCOUNTER — Encounter (HOSPITAL_BASED_OUTPATIENT_CLINIC_OR_DEPARTMENT_OTHER): Payer: Medicare Other | Admitting: Physician Assistant

## 2023-04-05 DIAGNOSIS — Z7901 Long term (current) use of anticoagulants: Secondary | ICD-10-CM | POA: Diagnosis not present

## 2023-04-05 DIAGNOSIS — I87321 Chronic venous hypertension (idiopathic) with inflammation of right lower extremity: Secondary | ICD-10-CM | POA: Diagnosis not present

## 2023-04-05 DIAGNOSIS — I130 Hypertensive heart and chronic kidney disease with heart failure and stage 1 through stage 4 chronic kidney disease, or unspecified chronic kidney disease: Secondary | ICD-10-CM | POA: Diagnosis not present

## 2023-04-05 DIAGNOSIS — E11622 Type 2 diabetes mellitus with other skin ulcer: Secondary | ICD-10-CM | POA: Diagnosis not present

## 2023-04-05 DIAGNOSIS — I480 Paroxysmal atrial fibrillation: Secondary | ICD-10-CM | POA: Diagnosis not present

## 2023-04-05 DIAGNOSIS — L97812 Non-pressure chronic ulcer of other part of right lower leg with fat layer exposed: Secondary | ICD-10-CM | POA: Diagnosis not present

## 2023-04-06 ENCOUNTER — Ambulatory Visit: Payer: Medicare Other | Admitting: Pharmacist

## 2023-04-06 NOTE — Progress Notes (Signed)
Care Management & Coordination Services Pharmacy Note  04/06/2023 Name:  [name] MRN:  [id number] DOB:  1932/03/16  Summary: F/U visit -HF: Pt reports wt stable 159-160 lbs; he is compliant with medictions -DM: A1c 8.0% (10/2022); reasonable control given age/comorbidities; fasting BG range 120-140 typically, he denies hypoglycemia -CKD stage 4: reviewed medication doses, CKD limits dose of allopurinol, Eliquis and precludes use of GDMT for HF (ARNI, MRA)  Recommendations/Changes made from today's visit: -No med changes  Follow up plan: -Health Concierge will call patient 6 months for HF update -Pharmacist follow up PRN -PCP 04/13/23; Cardiology 06/14/23    Subjective: [name] is an 89 y.o. year old male who is a primary patient of Para March, Dwana Curd, MD.  The care coordination team was consulted for assistance with disease management and care coordination needs.    Engaged with patient by telephone for initial visit. Patient lives at home alone. He has 2 children (son and daughter) and 2 grandchildren. He is a retired Software engineer and gets medications through [employer] at reduced cost. He no longer drives, his children will drive him where he needs to go.  Recent office visits: 11/03/22 Dr Para March OV: annual - A1c 8% reasonable goal. Discussed using lasix BID to help with SOB. Discussed gout trigger avoidance. F/u 3-4 mos.  Recent consult visits: 03/16/23 Cardiology TE: Cr up, reduce torsemide to 40/20 mg 03/09/23 Cardiology TE: increase torsemide to 40 mg BID  03/01/23 PA Weaver (Cardiology): f/u - volume overload. D/C furosemide, start torsemide 40 mg/20 mg; consider changing metoprolol to succinate or carvedilol. Increase isosorbide to 60 mg. MG 2.6, reduce Mag from 400 to 200 mg.  12/16/22 NP Cleaver (Cardiology): f/u -stable. No changes  11/30/22 Dr Allyson Sabal (Cardiology): f/u - recent ECHO showed worsened EF to 35-40%; given severe renal impairment, no invasive evaluation. Advised  to drop hydralazine to once a day (BP 108/68)  11/22/22 NP Wallis Bamberg (Cardiology): f/u CAD - c/o chest pain. Rx Imdur 30 mg. Frequently forgets 2nd dose of Lasix.  proBNP 6000 - will take 1-time dose of metolazone 30 min prior to lasix, repeat BNP and BMP 1/15.  Hospital visits: 12/06/22 ED visit [facility]): N/V/D, chest pain. Covid exposure 1 week prior. Workup wnl. Advised hold lasix for a few days, Resume Thurs 1/25. F/u 12/16/22.   Objective:  Lab Results  Component Value Date   CREATININE 3.16 (H) 03/23/2023   BUN 42 (H) 03/23/2023   GFR 18.94 (L) 10/27/2022   EGFR 18 (L) 03/23/2023   GFRNONAA 17 (L) 12/06/2022   GFRAA 24 (L) 07/30/2020   NA 140 03/23/2023   K 4.4 03/23/2023   CALCIUM 9.5 03/23/2023   CO2 27 03/23/2023   GLUCOSE 196 (H) 03/23/2023    Lab Results  Component Value Date/Time   HGBA1C 8.0 (H) 10/27/2022 08:54 AM   HGBA1C 9.3 (H) 10/29/2020 01:21 PM   HGBA1C 6.9 09/24/2015 12:00 AM   GFR 18.94 (L) 10/27/2022 08:54 AM   GFR 19.79 (L) 10/29/2020 01:21 PM   MICROALBUR 6.9 (H) 05/09/2018 11:24 AM   MICROALBUR <0.7 05/08/2017 02:53 PM    Last diabetic Eye exam:  Lab Results  Component Value Date/Time   HMDIABEYEEXA No Retinopathy 04/14/2016 12:00 AM    Last diabetic Foot exam:  Lab Results  Component Value Date/Time   HMDIABFOOTEX yes 10/21/2010 12:00 AM     Lab Results  Component Value Date   CHOL 115 10/27/2022   HDL 34.40 (L) 10/27/2022  LDLCALC 45 10/27/2022   TRIG 180.0 (H) 10/27/2022   CHOLHDL 3 10/27/2022       Latest Ref Rng & Units 03/08/2023    3:34 PM 12/06/2022    1:15 PM 10/27/2022    8:54 AM  Hepatic Function  Total Protein 6.5 - 8.1 g/dL  6.5  6.9   Albumin 3.7 - 4.7 g/dL 4.0  3.5  4.1   AST 15 - 41 U/L  33  17   ALT 0 - 44 U/L  27  13   Alk Phosphatase 38 - 126 U/L  67  73   Total Bilirubin 0.3 - 1.2 mg/dL  1.5  0.7     Lab Results  Component Value Date/Time   TSH 3.784 12/06/2022 01:30 PM   TSH 3.89 02/01/2019 11:24 AM    TSH 6.050 (H) 07/25/2018 12:18 PM       Latest Ref Rng & Units 03/08/2023    3:34 PM 12/06/2022    1:15 PM 10/27/2022    8:54 AM  CBC  WBC 3.4 - 10.8 x10E3/uL 7.7  6.2  7.8   Hemoglobin 13.0 - 17.7 g/dL 95.6  21.3  08.6   Hematocrit 37.5 - 51.0 % 37.4  36.6  36.7   Platelets 150 - 450 x10E3/uL 151  138  163.0     Lab Results  Component Value Date/Time   VD25OH 54.3 03/08/2023 03:34 PM   VD25OH 57.2 09/14/2022 12:00 AM   VD25OH 50.6 02/27/2018 12:00 AM   VITAMINB12 263 09/13/2007 10:28 AM    Clinical ASCVD: Yes  The ASCVD Risk score (Arnett DK, et al., 2019) failed to calculate for the following reasons:   The 2019 ASCVD risk score is only valid for ages 59 to 52        11/01/2022   10:53 AM 03/15/2022   10:15 AM 10/28/2021   10:06 AM  Depression screen PHQ 2/9  Decreased Interest 0 0 0  Down, Depressed, Hopeless 0 0 0  PHQ - 2 Score 0 0 0     Social History   Tobacco Use  Smoking Status Former   Packs/day: 1.00   Years: 8.00   Additional pack years: 0.00   Total pack years: 8.00   Types: Cigarettes   Quit date: 1958   Years since quitting: 66.4  Smokeless Tobacco Never  Tobacco Comments   "quit smoking cigarettes in 1958"   BP Readings from Last 3 Encounters:  03/01/23 (!) 140/70  12/16/22 138/62  12/06/22 122/68   Pulse Readings from Last 3 Encounters:  03/01/23 84  12/16/22 70  12/06/22 (!) 55   Wt Readings from Last 3 Encounters:  03/01/23 162 lb 6.4 oz (73.7 kg)  12/16/22 165 lb 6.4 oz (75 kg)  12/06/22 155 lb (70.3 kg)   BMI Readings from Last 3 Encounters:  03/01/23 25.44 kg/m  12/16/22 25.91 kg/m  12/06/22 24.28 kg/m    No Known Allergies  Medications Reviewed Today     Reviewed by Kathyrn Sheriff, RPH (Pharmacist) on 04/06/23 at 1130  Med List Status: <None>   Medication Order Taking? Sig Documenting Provider Last Dose Status Informant  allopurinol (ZYLOPRIM) 100 MG tablet [id number] Yes TAKE ONE-HALF (1/2) TABLET EVERY  MONDAY, [facility] AND FRIDAY Joaquim Nam, MD Taking Active   amoxicillin (AMOXIL) 500 MG tablet [id number] Yes Take by mouth. Patient takes 4 tablets prior to going to the dentist [provider] Taking Active   aspirin 81 MG tablet [id number] Yes  Take 81 mg by mouth daily. [provider] Taking Active Self  BD INSULIN SYRINGE ULTRAFINE 31G X 5/16" 0.3 ML MISC [id number] Yes USE DAILY AS INSTRUCTED Joaquim Nam, MD Taking Active Self  Cholecalciferol (VITAMIN D) 1000 UNITS capsule [id number] Yes Take 1,000 Units by mouth daily.  [provider] Taking Active Self  diclofenac Sodium (VOLTAREN) 1 % GEL [id number] Yes Apply 2 g topically 4 (four) times daily. Janace Aris, NP Taking Active   ELIQUIS 2.5 MG TABS tablet [id number] Yes TAKE 1 TABLET TWICE A DAY Runell Gess, MD Taking Active   empagliflozin (JARDIANCE) 10 MG TABS tablet [id number] Yes Take 1 tablet (10 mg total) by mouth daily before breakfast. PLEASE CALL THE OFFICE TO ARRANGE YOUR FOLLOW UP APPOINTMENT Bensimhon, Bevelyn Buckles, MD Taking Active   fish oil-omega-3 fatty acids 1000 MG capsule [id number] Yes Take 1 g by mouth daily. [provider] Taking Active Self  glucose blood (FREESTYLE LITE) test strip [id number] Yes USE TO TEST BLOOD SUGAR ONCE DAILY AND AS NEEDED. Dx E11.9 Joaquim Nam, MD Taking Active   hydrALAZINE (APRESOLINE) 25 MG tablet [id number] Yes Take 1 tablet (25 mg total) by mouth daily. Runell Gess, MD Taking Active   ipratropium (ATROVENT) 0.03 % nasal spray [id number] Yes USE 2 SPRAYS IN EACH NOSTRIL TWICE A DAY AS NEEDED FOR RHINITIS Joaquim Nam, MD Taking Active   isosorbide mononitrate (IMDUR) 60 MG 24 hr tablet [id number] Yes Take 1 tablet (60 mg total) by mouth daily. Tereso Newcomer T, [place] Taking Active   LANTUS 100 UNIT/ML injection [id number] Yes INJECT 20 TO 25 UNITS AT BEDTIME Joaquim Nam, MD Taking Active   magnesium oxide (MAG-OX) 400 MG tablet [id number]  Yes Take 0.5 tablets (200 mg total) by mouth daily.  Patient taking differently: Take 400 mg by mouth every other day.   Tereso Newcomer T, PA-C Taking Active   metoprolol tartrate (LOPRESSOR) 50 MG tablet [id number] Yes TAKE 1 TABLET(50 MG) BY MOUTH TWICE DAILY Runell Gess, MD Taking Active   Multiple Vitamin (MULTIVITAMIN WITH MINERALS) TABS [id number] Yes Take 1 tablet by mouth daily. [provider] Taking Active Self  nitroGLYCERIN (NITROSTAT) 0.4 MG SL tablet [id number] Yes DISSOLVE 1 TABLET UNDER THE TONGUE EVERY 5 MINUTES AS NEEDED FOR CHEST PAIN FOR A MAXIMUM OF 3 DOSES Runell Gess, MD Taking Active   polyethylene glycol (MIRALAX / GLYCOLAX) 17 g packet [id number] Yes Take 17 g by mouth daily as needed for mild constipation. [provider] Taking Active Self  Potassium Gluconate 595 MG CAPS [id number] Yes Take 595 mg by mouth daily. [provider] Taking Active Self  ranolazine (RANEXA) 500 MG 12 hr tablet [id number] Yes TAKE 1 TABLET TWICE A DAY Runell Gess, MD Taking Active   rosuvastatin (CRESTOR) 10 MG tablet [id number] Yes Take 1 tablet (10 mg total) by mouth daily. Tereso Newcomer T, PA-C Taking Active   torsemide (DEMADEX) 20 MG tablet [id number] Yes Take 2 tablets (40 mg total) every morning and 1 tablet (20 mg total) every evening. Tereso Newcomer T, PA-C Taking Active   traMADol (ULTRAM) 50 MG tablet [id number] Yes TAKE 1 TABLET EVERY 6 HOURS AS NEEDED Joaquim Nam, MD Taking Active   valACYclovir (VALTREX) 1000 MG tablet [id number] Yes Take by mouth. [provider] Taking Active             SDOH:  (Social Determinants of Health) assessments and interventions performed: No  SDOH Interventions    Flowsheet Row Telephone from 12/09/2022 in [facility] Care Coordination Clinical Support from 11/01/2022 in [facility] Chronic Care Management from 03/15/2022 in [facility] Clinical Support from 06/21/2019 in [facility]  SDOH Interventions      Food Insecurity Interventions Intervention Not Indicated Intervention Not Indicated Intervention Not Indicated --  Housing Interventions -- Intervention Not Indicated Intervention Not Indicated --  Transportation Interventions Intervention Not Indicated Intervention Not Indicated Intervention Not Indicated --  Depression Interventions/Treatment  -- -- -- ZOX0-9 Score <4 Follow-up Not Indicated  Financial Strain Interventions -- Intervention Not Indicated -- --  Physical Activity Interventions -- Intervention Not Indicated -- --  Stress Interventions -- Intervention Not Indicated -- --  Social Connections Interventions -- Intervention Not Indicated -- --       Medication Assistance: None required.  Patient affirms current coverage meets needs.  Medication Access: Within the past 30 days, how often has patient missed a dose of medication? 0 Is a pillbox or other method used to improve adherence? Yes  Factors that may affect medication adherence? no barriers identified Are meds synced by current pharmacy? No  Are meds delivered by current pharmacy? Yes  Does patient experience delays in picking up medications due to transportation concerns? No  Current Rx insurance plan: [employer] Name and location of Current pharmacy:  [facility] [phone] - [place], [place] - [address] AT [facility] [address] [place] [address] Phone: [phone] Fax: [phone]  [facility] - [place], [place] - [address] [place] [address] Phone: [phone] Fax: [phone]   Compliance/Adherence/Medication fill history: Care Gaps: Eye exam (due 03/2019)  Star-Rating Drugs: Jardiance - PDC 100% Rosuvastatin - PDC 100%   Assessment/Plan  Heart Failure (Goal: BP <  130/80) -Controlled - furosemide was on hold for last couple days, he started back this morning and reports wt is stable at his dry weight -Current home BP/HR readings:  131/68; P 64 -Current home daily weights: 161# today; stable between 159-160# -Last ejection fraction: 35-40% (Date: 11/28/22) -HF type: HFrEF (EF < 40%) -NYHA Class: III (marked limitation of activity) -AHA HF Stage: C (Heart disease and symptoms present) -Current treatment: Torsemide 40 mg/20 mg BID - Appropriate, Effective, Safe, Accessible Metoprolol tartrate 50 mg BID - Appropriate, Effective, Safe, Accessible Hydralazine 25 mg daily AM - Appropriate, Effective, Safe, Accessible Isosorbide MN 60 mg daily - Appropriate, Effective, Safe, Accessible Jardiance 10 mg daily - Appropriate, Effective, Safe, Accessible Potassium gluconate 595 mg daily - Appropriate, Effective, Safe, Accessible Magnesium 400 mg QOD -Appropriate, Effective, Safe, Accessible -Medications previously tried: n/a  -Educated on Benefits of medications for managing symptoms and prolonging life Importance of weighing daily; if you gain more than 3 pounds in one day or 5 pounds in one week, contact cardiology;Proper diuretic administration and potassium supplementation -Reviewed GDMT: not a candidate for ACE/ARB, MRA or ARNI due to CKDIV -Counseled to monitor BP at home daily -Recommended to continue current medication  Hyperlipidemia / CAD (LDL goal < 70) -Controlled - LDL 45 (10/2022) at goal -Pt reports "chest pain" in the past 2 months has been more in his back, it is not the same as the anginal symptoms he used to have -Hx CABG 1996, 2006. -Current treatment: Rosuvastatin 40 mg daily HS -  Appropriate, Effective, Safe, Accessible Ranexa 500 mg BID - Appropriate, Effective, Safe, Accessible Isosorbide MN 60 mg daily - Appropriate, Effective, Safe, Accessible Nitroglycerin 0.4 mg SL PRN - used 4x in past 2 months Aspirin 81 mg daily - Appropriate,  Effective, Safe, Accessible Fish oil 1000 mg daily - Appropriate, Effective, Safe, Accessible -Medications previously tried: Plavix, Brilinta -Educated on Cholesterol goals; benefits of statin; benefits of Ranexa and nitrates for anginal symptoms -Discussed refilling NTG every 6 months -Reviewed rosuvastatin dose - maximum dose for GFR < 30 is 10 mg, however pt has been on 40 mg since 2019 and has had GFR < 30 since ~2016, benefits outweigh risks at this point and it is reasonable to continue 40 mg dose -Recommended to continue current medication  Diabetes (A1c goal <8%) -Controlled - A1c 8.0% (10/2022); he recently started CBD gummies, discussed these may have sugar in them -Current home glucose readings fasting glucose: 81 today; 134, 84, 132 -Denies hypoglycemic/hyperglycemic symptoms -Pt reports he reduced calories by 1/2 over the past year, has lost 10-12 lbs -Current medications: Jardiance 10 mg daily - Appropriate, Effective, Safe, Accessible Lantus vial 20-25 units HS  - Appropriate, Effective, Safe, Accessible Test strips -Medications previously tried: n/a  -Current meal patterns:  dinner: black forest ham, swiss cheese, yogurt, cheese crackers  -Current exercise: limited -Educated on A1c and blood sugar goals;Benefits of weight loss; -Reviewed diet changes to reduce carbs/sweets -Recommended to continue current medication  Atrial Fibrillation (Goal: prevent stroke and major bleeding) -Controlled - follows with cardiology -CHADSVASC: 5 -Hx GI bleed 2016 -Current treatment: Eliquis 2.5 mg BID - Appropriate, Effective, Safe, Accessible Metoprolol tartrate 50 mg BID - Appropriate, Effective, Safe, Accessible -Medications previously tried: n/a -Counseled on increased risk of stroke due to Afib and benefits of anticoagulation for stroke prevention; bleeding risk associated with Eliquis and importance of self-monitoring for signs/symptoms of bleeding; avoidance of NSAIDs due to  increased bleeding risk with anticoagulants; seeking medical attention after a head injury or if there is blood in the urine/stool; -Recommended to continue current medication  Gout (Goal: Prevent gout flares) -Not ideally controlled - uric acid 7.0 (10/2022) above goal of <6, but CKD limits allopurinol dose -Last Gout Flare: 10/2022 -Pt reports beef is a trigger - he tries to avoid this -Current treatment  Allopurinol 100 mg - 1/2 tab MWF - Appropriate, Query Effective -Medications previously tried: n/a  - Counseled patient on low purine diet plan. Counseled patient to reduce consumption of high-fructose corn syrup, sweetened soft drinks, fruit juices, meat, and seafood. -Recommended to continue current medication  Chronic Kidney Disease Stage 4  -All medications assessed for renal dosing and appropriateness in chronic kidney disease. -Recommended to continue current medication  Health Maintenance -Vaccine gaps: Shingrix -Discussed Shingrix and RSV vaccine previously   Al Corpus, PharmD, BCACP Clinical Pharmacist [facility] [phone]

## 2023-04-07 DIAGNOSIS — L97812 Non-pressure chronic ulcer of other part of right lower leg with fat layer exposed: Secondary | ICD-10-CM | POA: Diagnosis not present

## 2023-04-07 DIAGNOSIS — N183 Chronic kidney disease, stage 3 unspecified: Secondary | ICD-10-CM | POA: Diagnosis not present

## 2023-04-07 DIAGNOSIS — I130 Hypertensive heart and chronic kidney disease with heart failure and stage 1 through stage 4 chronic kidney disease, or unspecified chronic kidney disease: Secondary | ICD-10-CM | POA: Diagnosis not present

## 2023-04-07 DIAGNOSIS — I87331 Chronic venous hypertension (idiopathic) with ulcer and inflammation of right lower extremity: Secondary | ICD-10-CM | POA: Diagnosis not present

## 2023-04-07 DIAGNOSIS — I5042 Chronic combined systolic (congestive) and diastolic (congestive) heart failure: Secondary | ICD-10-CM | POA: Diagnosis not present

## 2023-04-07 DIAGNOSIS — E1122 Type 2 diabetes mellitus with diabetic chronic kidney disease: Secondary | ICD-10-CM | POA: Diagnosis not present

## 2023-04-10 DIAGNOSIS — Z7982 Long term (current) use of aspirin: Secondary | ICD-10-CM | POA: Diagnosis not present

## 2023-04-10 DIAGNOSIS — I272 Pulmonary hypertension, unspecified: Secondary | ICD-10-CM | POA: Diagnosis not present

## 2023-04-10 DIAGNOSIS — H9190 Unspecified hearing loss, unspecified ear: Secondary | ICD-10-CM | POA: Diagnosis not present

## 2023-04-10 DIAGNOSIS — N183 Chronic kidney disease, stage 3 unspecified: Secondary | ICD-10-CM | POA: Diagnosis not present

## 2023-04-10 DIAGNOSIS — E785 Hyperlipidemia, unspecified: Secondary | ICD-10-CM | POA: Diagnosis not present

## 2023-04-10 DIAGNOSIS — Z794 Long term (current) use of insulin: Secondary | ICD-10-CM | POA: Diagnosis not present

## 2023-04-10 DIAGNOSIS — I4510 Unspecified right bundle-branch block: Secondary | ICD-10-CM | POA: Diagnosis not present

## 2023-04-10 DIAGNOSIS — L97812 Non-pressure chronic ulcer of other part of right lower leg with fat layer exposed: Secondary | ICD-10-CM | POA: Diagnosis not present

## 2023-04-10 DIAGNOSIS — E1122 Type 2 diabetes mellitus with diabetic chronic kidney disease: Secondary | ICD-10-CM | POA: Diagnosis not present

## 2023-04-10 DIAGNOSIS — D631 Anemia in chronic kidney disease: Secondary | ICD-10-CM | POA: Diagnosis not present

## 2023-04-10 DIAGNOSIS — I5042 Chronic combined systolic (congestive) and diastolic (congestive) heart failure: Secondary | ICD-10-CM | POA: Diagnosis not present

## 2023-04-10 DIAGNOSIS — I130 Hypertensive heart and chronic kidney disease with heart failure and stage 1 through stage 4 chronic kidney disease, or unspecified chronic kidney disease: Secondary | ICD-10-CM | POA: Diagnosis not present

## 2023-04-10 DIAGNOSIS — H353230 Exudative age-related macular degeneration, bilateral, stage unspecified: Secondary | ICD-10-CM | POA: Diagnosis not present

## 2023-04-10 DIAGNOSIS — G4733 Obstructive sleep apnea (adult) (pediatric): Secondary | ICD-10-CM | POA: Diagnosis not present

## 2023-04-10 DIAGNOSIS — I2510 Atherosclerotic heart disease of native coronary artery without angina pectoris: Secondary | ICD-10-CM | POA: Diagnosis not present

## 2023-04-10 DIAGNOSIS — M1990 Unspecified osteoarthritis, unspecified site: Secondary | ICD-10-CM | POA: Diagnosis not present

## 2023-04-10 DIAGNOSIS — M109 Gout, unspecified: Secondary | ICD-10-CM | POA: Diagnosis not present

## 2023-04-10 DIAGNOSIS — I4821 Permanent atrial fibrillation: Secondary | ICD-10-CM | POA: Diagnosis not present

## 2023-04-10 DIAGNOSIS — N529 Male erectile dysfunction, unspecified: Secondary | ICD-10-CM | POA: Diagnosis not present

## 2023-04-10 DIAGNOSIS — I4892 Unspecified atrial flutter: Secondary | ICD-10-CM | POA: Diagnosis not present

## 2023-04-10 DIAGNOSIS — I87331 Chronic venous hypertension (idiopathic) with ulcer and inflammation of right lower extremity: Secondary | ICD-10-CM | POA: Diagnosis not present

## 2023-04-10 DIAGNOSIS — K5790 Diverticulosis of intestine, part unspecified, without perforation or abscess without bleeding: Secondary | ICD-10-CM | POA: Diagnosis not present

## 2023-04-10 DIAGNOSIS — R197 Diarrhea, unspecified: Secondary | ICD-10-CM | POA: Diagnosis not present

## 2023-04-10 DIAGNOSIS — E1142 Type 2 diabetes mellitus with diabetic polyneuropathy: Secondary | ICD-10-CM | POA: Diagnosis not present

## 2023-04-10 DIAGNOSIS — I252 Old myocardial infarction: Secondary | ICD-10-CM | POA: Diagnosis not present

## 2023-04-13 ENCOUNTER — Encounter: Payer: Self-pay | Admitting: Family Medicine

## 2023-04-13 ENCOUNTER — Ambulatory Visit: Payer: Medicare Other | Admitting: Family Medicine

## 2023-04-13 VITALS — BP 116/58 | HR 68 | Temp 97.8°F | Ht 67.0 in | Wt 162.0 lb

## 2023-04-13 DIAGNOSIS — E119 Type 2 diabetes mellitus without complications: Secondary | ICD-10-CM

## 2023-04-13 DIAGNOSIS — I10 Essential (primary) hypertension: Secondary | ICD-10-CM | POA: Diagnosis not present

## 2023-04-13 DIAGNOSIS — Z794 Long term (current) use of insulin: Secondary | ICD-10-CM | POA: Diagnosis not present

## 2023-04-13 DIAGNOSIS — E1142 Type 2 diabetes mellitus with diabetic polyneuropathy: Secondary | ICD-10-CM

## 2023-04-13 LAB — POCT GLYCOSYLATED HEMOGLOBIN (HGB A1C): Hemoglobin A1C: 6.9 % — AB (ref 4.0–5.6)

## 2023-04-13 MED ORDER — MAGNESIUM OXIDE 400 MG PO TABS: 400.0000 mg | ORAL_TABLET | ORAL | Status: DC

## 2023-04-13 NOTE — Patient Instructions (Addendum)
But insulin back to 20 units a day.  Let me know how that goes.   Goal sugar would be around 120-140 without any lows.   Take care.  Glad to see you.  Recheck in about 6 months- yearly visit.    Update me sooner if needed.    Keep going with the compression stockings.

## 2023-04-13 NOTE — Progress Notes (Signed)
Diabetes:  Using medications without difficulties: yes Hypoglycemic episodes: rare/single event, d/w pt.  Hyperglycemic episodes:no Feet problems: no Blood Sugars averaging: prev ~160, now ~120.   eye exam within last year: due, d/w pt.   Jardiance 10mg  and 25 units lantus.    He had been using compression stockings to help with BLE edema.    He isn't lightheaded, BP 132/71 this AM at home.    Meds, vitals, and allergies reviewed.  ROS: Per HPI unless specifically indicated in ROS section   GEN: nad, alert and oriented HEENT: ncat NECK: supple w/o LA CV: Faint SEM, IRR, not tachycardic PULM: ctab, no inc wob ABD: soft, +bs EXT: mild L>R BLE edema SKIN: no acute rash  Diabetic foot exam: Normal inspection No skin breakdown No calluses  Decreased dorsalis pedis pulse on the right, intact on the left. Normal sensation to light touch and monofilament Nails thickened.   30 minutes were devoted to patient care in this encounter (this includes time spent reviewing the patient's file/history, interviewing and examining the patient, counseling/reviewing plan with patient).

## 2023-04-16 NOTE — Assessment & Plan Note (Signed)
Has been taking Jardiance 10mg  and 25 units lantus.   Would cut insulin back to 20 units a day.  He can let me know how that goes.   Goal sugar would be around 120-140 without any lows.   Recheck in about 6 months- yearly visit.

## 2023-04-16 NOTE — Assessment & Plan Note (Signed)
He had been using compression stockings to help with BLE edema.   He isn't lightheaded, BP 132/71 this AM at home.  Would continue hydralazine isosorbide metoprolol torsemide as is.

## 2023-04-17 DIAGNOSIS — N183 Chronic kidney disease, stage 3 unspecified: Secondary | ICD-10-CM | POA: Diagnosis not present

## 2023-04-17 DIAGNOSIS — I5042 Chronic combined systolic (congestive) and diastolic (congestive) heart failure: Secondary | ICD-10-CM | POA: Diagnosis not present

## 2023-04-17 DIAGNOSIS — L97812 Non-pressure chronic ulcer of other part of right lower leg with fat layer exposed: Secondary | ICD-10-CM | POA: Diagnosis not present

## 2023-04-17 DIAGNOSIS — E1122 Type 2 diabetes mellitus with diabetic chronic kidney disease: Secondary | ICD-10-CM | POA: Diagnosis not present

## 2023-04-17 DIAGNOSIS — I87331 Chronic venous hypertension (idiopathic) with ulcer and inflammation of right lower extremity: Secondary | ICD-10-CM | POA: Diagnosis not present

## 2023-04-17 DIAGNOSIS — I130 Hypertensive heart and chronic kidney disease with heart failure and stage 1 through stage 4 chronic kidney disease, or unspecified chronic kidney disease: Secondary | ICD-10-CM | POA: Diagnosis not present

## 2023-04-24 DIAGNOSIS — L97812 Non-pressure chronic ulcer of other part of right lower leg with fat layer exposed: Secondary | ICD-10-CM | POA: Diagnosis not present

## 2023-04-24 DIAGNOSIS — I87331 Chronic venous hypertension (idiopathic) with ulcer and inflammation of right lower extremity: Secondary | ICD-10-CM | POA: Diagnosis not present

## 2023-04-24 DIAGNOSIS — I5042 Chronic combined systolic (congestive) and diastolic (congestive) heart failure: Secondary | ICD-10-CM | POA: Diagnosis not present

## 2023-04-24 DIAGNOSIS — N183 Chronic kidney disease, stage 3 unspecified: Secondary | ICD-10-CM | POA: Diagnosis not present

## 2023-04-24 DIAGNOSIS — I130 Hypertensive heart and chronic kidney disease with heart failure and stage 1 through stage 4 chronic kidney disease, or unspecified chronic kidney disease: Secondary | ICD-10-CM | POA: Diagnosis not present

## 2023-04-24 DIAGNOSIS — E1122 Type 2 diabetes mellitus with diabetic chronic kidney disease: Secondary | ICD-10-CM | POA: Diagnosis not present

## 2023-05-01 DIAGNOSIS — I5042 Chronic combined systolic (congestive) and diastolic (congestive) heart failure: Secondary | ICD-10-CM | POA: Diagnosis not present

## 2023-05-01 DIAGNOSIS — L97812 Non-pressure chronic ulcer of other part of right lower leg with fat layer exposed: Secondary | ICD-10-CM | POA: Diagnosis not present

## 2023-05-01 DIAGNOSIS — I130 Hypertensive heart and chronic kidney disease with heart failure and stage 1 through stage 4 chronic kidney disease, or unspecified chronic kidney disease: Secondary | ICD-10-CM | POA: Diagnosis not present

## 2023-05-01 DIAGNOSIS — E1122 Type 2 diabetes mellitus with diabetic chronic kidney disease: Secondary | ICD-10-CM | POA: Diagnosis not present

## 2023-05-01 DIAGNOSIS — I87331 Chronic venous hypertension (idiopathic) with ulcer and inflammation of right lower extremity: Secondary | ICD-10-CM | POA: Diagnosis not present

## 2023-05-01 DIAGNOSIS — N183 Chronic kidney disease, stage 3 unspecified: Secondary | ICD-10-CM | POA: Diagnosis not present

## 2023-05-09 DIAGNOSIS — L97812 Non-pressure chronic ulcer of other part of right lower leg with fat layer exposed: Secondary | ICD-10-CM | POA: Diagnosis not present

## 2023-05-09 DIAGNOSIS — I5042 Chronic combined systolic (congestive) and diastolic (congestive) heart failure: Secondary | ICD-10-CM | POA: Diagnosis not present

## 2023-05-09 DIAGNOSIS — I130 Hypertensive heart and chronic kidney disease with heart failure and stage 1 through stage 4 chronic kidney disease, or unspecified chronic kidney disease: Secondary | ICD-10-CM | POA: Diagnosis not present

## 2023-05-09 DIAGNOSIS — I87331 Chronic venous hypertension (idiopathic) with ulcer and inflammation of right lower extremity: Secondary | ICD-10-CM | POA: Diagnosis not present

## 2023-05-09 DIAGNOSIS — E1122 Type 2 diabetes mellitus with diabetic chronic kidney disease: Secondary | ICD-10-CM | POA: Diagnosis not present

## 2023-05-09 DIAGNOSIS — N183 Chronic kidney disease, stage 3 unspecified: Secondary | ICD-10-CM | POA: Diagnosis not present

## 2023-05-10 DIAGNOSIS — C44622 Squamous cell carcinoma of skin of right upper limb, including shoulder: Secondary | ICD-10-CM | POA: Diagnosis not present

## 2023-05-10 DIAGNOSIS — C44629 Squamous cell carcinoma of skin of left upper limb, including shoulder: Secondary | ICD-10-CM | POA: Diagnosis not present

## 2023-05-10 DIAGNOSIS — Z1283 Encounter for screening for malignant neoplasm of skin: Secondary | ICD-10-CM | POA: Diagnosis not present

## 2023-05-15 NOTE — Progress Notes (Signed)
TAIYO, KUDER ([id number]) [id number] Page 1 of 10 Visit Report for 03/08/2023 Chief Complaint Document Details Patient Name: Date of Service: [name], [name] 03/08/2023 1:30 PM Medical Record Number: [id number] Patient Account Number: [id number] Date of Birth/Sex: Treating RN: 1932/07/17 (89 y.o. M) Primary Care Provider: Sunnie Nielsen Other Clinician: Referring Provider: Treating Provider/Extender: Donney Rankins Weeks in Treatment: 0 Information Obtained from: Patient Chief Complaint Right LE Ulcer Electronic Signature(s) Signed: 03/08/2023 2:35:26 PM By: Allen Derry PA-C Entered By: Allen Derry on 03/08/2023 14:35:26 -------------------------------------------------------------------------------- Debridement Details Patient Name: Date of Service: [name]. 03/08/2023 1:30 PM Medical Record Number: [id number] Patient Account Number: [id number] Date of Birth/Sex: Treating RN: 08-30-1932 (89 y.o. Dianna Limbo Primary Care Provider: Sunnie Nielsen Other Clinician: Referring Provider: Treating Provider/Extender: Donney Rankins Weeks in Treatment: 0 Debridement Performed for Assessment: Wound #2 Right,Medial Lower Leg Performed By: Physician Lenda Kelp, PA Debridement Type: Debridement Severity of Tissue Pre Debridement: Fat layer exposed Level of Consciousness (Pre-procedure): Awake and Alert Pre-procedure Verification/Time Out Yes - 14:40 Taken: Start Time: 14:40 Pain Control: Lidocaine 4% T opical Solution Percent of Wound Bed Debrided: 100% T Area Debrided (cm): otal 0.86 Tissue and other material debrided: Non-Viable, Slough, Subcutaneous, Biofilm, Slough Level: Skin/Subcutaneous Tissue Debridement Description: Excisional Instrument: Curette Bleeding: Minimum Hemostasis Achieved: Pressure End Time: 14:41 Procedural Pain: 0 Post Procedural Pain: 0 Response to Treatment:  Procedure was tolerated well Level of Consciousness (Post- Awake and Alert procedure): Post Debridement Measurements of Total Wound Length: (cm) 1.1 Width: (cm) 1 Depth: (cm) 0.1 Volume: (cm) 0.086 Character of Wound/Ulcer Post Debridement: Improved CALLAN, REATEGUI ([id number]) [id number] Page 2 of 10 Severity of Tissue Post Debridement: Fat layer exposed Post Procedure Diagnosis Same as Pre-procedure Notes Scribed for Allen Derry PA by J.Scotton RN Electronic Signature(s) Signed: 03/08/2023 5:08:22 PM By: Karie Schwalbe RN Signed: 03/08/2023 5:22:52 PM By: Allen Derry PA-C Entered By: Karie Schwalbe on 03/08/2023 14:43:59 -------------------------------------------------------------------------------- HPI Details Patient Name: Date of Service: [name]. 03/08/2023 1:30 PM Medical Record Number: [id number] Patient Account Number: [id number] Date of Birth/Sex: Treating RN: April 28, 1932 (89 y.o. M) Primary Care Provider: Sunnie Nielsen Other Clinician: Referring Provider: Treating Provider/Extender: Donney Rankins Weeks in Treatment: 0 History of Present Illness HPI Description: ADMISSION 12/07/2021 This is a pleasant 89 year old man who arrives in clinic accompanied by his daughter for review of a traumatic wound on the right leg that happened on 11/18/2021. This was a result of a fall he had at home. He was seen in the ER on the same day and had 7 sutures placed. Ultimately the sutures were removed by the family physician on 1/16 however the wound dehisced. They stated that it was hard to get the original dressing off that he had not changed as well. It was felt to be infected. He was given Rocephin and subsequently doxycycline which he finished yesterday. A culture at that time showed methicillin sensitive staph aureus. He comes in the clinic today with eschar over the entire wound surface. There is erythema around the wound  but he says he had some of that even before this happened. He quite clearly has chronic venous insufficiency bilaterally. Past medical history includes basal cell CA of the skin, hypertension, chronic kidney disease, heart failure with preserved ejection fraction, coronary artery disease, carotid stenosis, atrial fib on Eliquis, history of vein stripping in the right leg, type 2 diabetes on insulin and  mitral valve regurgitation ABI in our clinic was 0.92 on the right 2/21; continued nice improvements in this wound. Healthy granulation using Hydrofera Blue under 3 lack compression. 1/31; wound looks better this week smaller still requiring debridement. We will use silver alginate under compression 2/7; wound looks better no debridement is required I changed him to [facility] today still under compression 2/14; continued nice improvement Hydrofera Blue under compression 2/28; continued improvement in wound measurements using Hydrofera Blue and compression. This wound looks like it's progressing towards closure initially a large traumatic wound in the setting of chronic venous insufficiency 01/18/2022: Continued improvement with Hydrofera Blue and compression. Minimal debridement required. 01/25/2022: Continued decrease in wound dimensions. It is very nearly closed. He has been in [facility] with 3 layer compression. 02/01/2022: Wound is closed. Readmission: 03-08-2023 upon evaluation today patient appears to be doing well currently all things considered in regard to the wound on his right leg. He has been tolerating the dressing changes without complication. Fortunately his ABI was actually 0.93 at his last visit last year and again he has leg seems to be doing quite well he tolerated compression wrap and at that point. Fortunately I do not see any signs of active infection locally or systemically which is great news. Of note the patient did see his cardiologist he was switched from Lasix to  torsemide that was just this month. He also had a DVT study ordered which was completed on 4- 18-2024 this was negative for DVT bilaterally. Overall I think this appears to be more of a venous leg ulcer that showed up just as result of him having an s overabundance of fluid collected in his leg. Patient does have a history of diabetes mellitus type 2, chronic venous hypertension, atrial fibrillation, long-term use of anticoagulant therapy, congestive heart failure, and he has had a previous myocardial infarction. Electronic Signature(s) Signed: 03/08/2023 2:52:28 PM By: Allen Derry PA-C Entered By: Allen Derry on 03/08/2023 14:52:28 Leabo, Landis Martins ([id number]) [id number] Page 3 of 10 -------------------------------------------------------------------------------- Physical Exam Details Patient Name: Date of Service: SIDARTH, GOLON 03/08/2023 1:30 PM Medical Record Number: [id number] Patient Account Number: [id number] Date of Birth/Sex: Treating RN: Nov 21, 1931 (89 y.o. M) Primary Care Provider: Sunnie Nielsen Other Clinician: Referring Provider: Treating Provider/Extender: Donney Rankins Weeks in Treatment: 0 Constitutional sitting or standing blood pressure is within target range for patient.. pulse regular and within target range for patient.[name] respirations regular, non-labored and within target range for patient.[name] temperature within target range for patient.. Well-nourished and well-hydrated in no acute distress. Eyes conjunctiva clear no eyelid edema noted. pupils equal round and reactive to light and accommodation. Ears, Nose, Mouth, and Throat no gross abnormality of ear auricles or external auditory canals. normal hearing noted during conversation. mucus membranes moist. Respiratory normal breathing without difficulty. Cardiovascular 1+ dorsalis pedis/posterior tibialis pulses. 1+ pitting edema of the bilateral lower  extremities. Musculoskeletal normal gait and posture. no significant deformity or arthritic changes, no loss or range of motion, no clubbing. Psychiatric this patient is able to make decisions and demonstrates good insight into disease process. Alert and Oriented x 3. pleasant and cooperative. Notes Upon inspection patient's wound bed actually showed signs of good granulation and epithelization at this point. Fortunately I do not see any evidence of active infection locally or systemically which is great news. He does have some necrotic tissue noted that I want to perform some debridement on him and he did tolerate that debridement  today without complication. Postdebridement the wound bed appears to be doing significantly better which is great news I think that he would benefit from a compression wrap obviously he does have some edema here Electronic Signature(s) Signed: 03/08/2023 2:53:14 PM By: Allen Derry PA-C Entered By: Allen Derry on 03/08/2023 14:53:14 -------------------------------------------------------------------------------- Physician Orders Details Patient Name: Date of Service: [name]. 03/08/2023 1:30 PM Medical Record Number: [id number] Patient Account Number: [id number] Date of Birth/Sex: Treating RN: 04/12/1932 (89 y.o. Dianna Limbo Primary Care Provider: Sunnie Nielsen Other Clinician: Referring Provider: Treating Provider/Extender: Donney Rankins Weeks in Treatment: 0 Verbal / Phone Orders: No Diagnosis Coding ICD-10 Coding Code Description E11.622 Type 2 diabetes mellitus with other skin ulcer I87.321 Chronic venous hypertension (idiopathic) with inflammation of right lower extremity L97.812 Non-pressure chronic ulcer of other part of right lower leg with fat layer exposed CAMBER, CHOUDHURY ([id number]) [id number] Page 4 of 10 I48.0 Paroxysmal atrial fibrillation Z79.01 Long term (current) use of  anticoagulants I50.42 Chronic combined systolic (congestive) and diastolic (congestive) heart failure I25.10 Atherosclerotic heart disease of native coronary artery without angina pectoris Follow-up Appointments ppointment in 1 week. - Arshdeep Bolger Room 7 Return A Anesthetic (In clinic) Topical Lidocaine 4% applied to wound bed Bathing/ Shower/ Hygiene May shower with protection but do not get wound dressing(s) wet. Protect dressing(s) with water repellant cover (for example, large plastic bag) or a cast cover and may then take shower. - Please do not get leg wraps wet. Edema Control - Lymphedema / SCD / Other Bilateral Lower Extremities Elevate legs to the level of the heart or above for 30 minutes daily and/or when sitting for 3-4 times a day throughout the day. Avoid standing for long periods of time. Patient to wear own compression stockings every day. - On left leg and when healed on the right, wear compression on the right leg Exercise regularly - As tolerated Compression stocking or Garment 20-30 mm/Hg pressure to: - Left leg Additional Orders / Instructions Follow Nutritious Diet - Please include protein supplements. Goal is 70-100g a day. Wound Treatment Wound #2 - Lower Leg Wound Laterality: Right, Medial Cleanser: Soap and Water 1 x Per Week/30 Days Discharge Instructions: May shower and wash wound with dial antibacterial soap and water prior to dressing change. Cleanser: Vashe 5.8 (oz) 1 x Per Week/30 Days Discharge Instructions: Cleanse the wound with Vashe prior to applying a clean dressing using gauze sponges, not tissue or cotton balls. Cleanser: Wound Cleanser 1 x Per Week/30 Days Discharge Instructions: Cleanse the wound with wound cleanser prior to applying a clean dressing using gauze sponges, not tissue or cotton balls. Peri-Wound Care: Sween Lotion (Moisturizing lotion) 1 x Per Week/30 Days Discharge Instructions: Apply moisturizing lotion as directed Prim Dressing:  Hydrofera Blue Ready Transfer Foam, 2.5x2.5 (in/in) 1 x Per Week/30 Days ary Discharge Instructions: Apply directly to wound bed as directed Secondary Dressing: Zetuvit Plus 4x8 in 1 x Per Week/30 Days Discharge Instructions: Apply over primary dressing as directed. Compression Wrap: ThreePress (3 layer compression wrap) 1 x Per Week/30 Days Discharge Instructions: Apply three layer compression as directed. Compression Wrap: Urgo K2 Lite, two layer compression system, regular 1 x Per Week/30 Days Discharge Instructions: OR Apply Urgo K2 Lite as directed (alternative to 3 layer compression). Compression Wrap: Netting, #5 1 x Per Week/30 Days Electronic Signature(s) Signed: 03/08/2023 5:08:22 PM By: Karie Schwalbe RN Signed: 03/08/2023 5:22:52 PM By: Allen Derry PA-C Entered By: Karie Schwalbe on 03/08/2023  14:56:43 -------------------------------------------------------------------------------- Problem List Details Patient Name: Date of Service: SANDON, BUZZELL 03/08/2023 1:30 PM Medical Record Number: [id number] Patient Account Number: [id number] Date of Birth/Sex: Treating RN: 07-27-1932 (89 y.o. M) Primary Care Provider: Sunnie Nielsen Other Clinician: Referring Provider: Treating Provider/Extender: Donney Rankins Weeks in Treatment: 0 JAKHI, PARA ([id number]) [id number] Page 5 of 10 Active Problems ICD-10 Encounter Code Description Active Date MDM Diagnosis E11.622 Type 2 diabetes mellitus with other skin ulcer 03/08/2023 No Yes I87.321 Chronic venous hypertension (idiopathic) with inflammation of right lower 03/08/2023 No Yes extremity L97.812 Non-pressure chronic ulcer of other part of right lower leg with fat layer 03/08/2023 No Yes exposed I48.0 Paroxysmal atrial fibrillation 03/08/2023 No Yes Z79.01 Long term (current) use of anticoagulants 03/08/2023 No Yes I50.42 Chronic combined systolic (congestive) and diastolic  (congestive) heart failure 03/08/2023 No Yes I25.10 Atherosclerotic heart disease of native coronary artery without angina pectoris 03/08/2023 No Yes Inactive Problems Resolved Problems Electronic Signature(s) Signed: 03/08/2023 2:35:15 PM By: Allen Derry PA-C Entered By: Allen Derry on 03/08/2023 14:35:15 -------------------------------------------------------------------------------- Progress Note Details Patient Name: Date of Service: [name]. 03/08/2023 1:30 PM Medical Record Number: [id number] Patient Account Number: [id number] Date of Birth/Sex: Treating RN: 08/17/1932 (89 y.o. M) Primary Care Provider: Sunnie Nielsen Other Clinician: Referring Provider: Treating Provider/Extender: Donney Rankins Weeks in Treatment: 0 Subjective Chief Complaint Information obtained from Patient Right LE Ulcer History of Present Illness (HPI) ADMISSION 12/07/2021 This is a pleasant 89 year old man who arrives in clinic accompanied by his daughter for review of a traumatic wound on the right leg that happened on 11/18/2021. This was a result of a fall he had at home. He was seen in the ER on the same day and had 7 sutures placed. Ultimately the sutures were removed by the family physician on 1/16 however the wound dehisced. They stated that it was hard to get the original dressing off that he had not changed as well. It JESSIEL, LANGLAIS ([id number]) [id number] Page 6 of 10 was felt to be infected. He was given Rocephin and subsequently doxycycline which he finished yesterday. A culture at that time showed methicillin sensitive staph aureus. He comes in the clinic today with eschar over the entire wound surface. There is erythema around the wound but he says he had some of that even before this happened. He quite clearly has chronic venous insufficiency bilaterally. Past medical history includes basal cell CA of the skin, hypertension, chronic  kidney disease, heart failure with preserved ejection fraction, coronary artery disease, carotid stenosis, atrial fib on Eliquis, history of vein stripping in the right leg, type 2 diabetes on insulin and mitral valve regurgitation ABI in our clinic was 0.92 on the right 2/21; continued nice improvements in this wound. Healthy granulation using Hydrofera Blue under 3 lack compression. 1/31; wound looks better this week smaller still requiring debridement. We will use silver alginate under compression 2/7; wound looks better no debridement is required I changed him to [facility] today still under compression 2/14; continued nice improvement Hydrofera Blue under compression 2/28; continued improvement in wound measurements using Hydrofera Blue and compression. This wound looks like it's progressing towards closure initially a large traumatic wound in the setting of chronic venous insufficiency 01/18/2022: Continued improvement with Hydrofera Blue and compression. Minimal debridement required. 01/25/2022: Continued decrease in wound dimensions. It is very nearly closed. He has been in [facility] with 3 layer compression. 02/01/2022: Wound is closed.  Readmission: 03-08-2023 upon evaluation today patient appears to be doing well currently all things considered in regard to the wound on his right leg. He has been tolerating the dressing changes without complication. Fortunately his ABI was actually 0.93 at his last visit last year and again he has leg seems to be doing quite well he tolerated compression wrap and at that point. Fortunately I do not see any signs of active infection locally or systemically which is great news. Of note the patient did see his cardiologist he was switched from Lasix to torsemide that was just this month. He also had a DVT study ordered which was completed on 4- 18-2024 this was negative for DVT bilaterally. Overall I think this appears to be more of a venous leg ulcer  that showed up just as result of him having an s overabundance of fluid collected in his leg. Patient does have a history of diabetes mellitus type 2, chronic venous hypertension, atrial fibrillation, long-term use of anticoagulant therapy, congestive heart failure, and he has had a previous myocardial infarction. Patient History Information obtained from Patient. Allergies No Known Allergies Social History Never smoker, Marital Status - Widowed, Alcohol Use - Never, Drug Use - No History, Caffeine Use - Never. Medical History Eyes Denies history of Cataracts, Glaucoma, Optic Neuritis Ear/Nose/Mouth/Throat Denies history of Chronic sinus problems/congestion, Middle ear problems Hematologic/Lymphatic Patient has history of Anemia Denies history of Hemophilia, Human Immunodeficiency Virus, Lymphedema, Sickle Cell Disease Respiratory Denies history of Aspiration, Asthma, Chronic Obstructive Pulmonary Disease (COPD), Pneumothorax, Sleep Apnea, Tuberculosis Cardiovascular Patient has history of Arrhythmia - Afib, Congestive Heart Failure, Coronary Artery Disease, Hypertension, Myocardial Infarction - bypass 1996, 2006 Endocrine Patient has history of Type II Diabetes Genitourinary Denies history of End Stage Renal Disease Musculoskeletal Patient has history of Gout Neurologic Patient has history of Neuropathy Medical A Surgical History Notes nd Constitutional Symptoms (General Health) 03/01/2023 PA at cardiology worrisome for DVT- DVT study to BLE. negative. has weeping to legs. Eyes ARMD Genitourinary CKD III Objective Constitutional GUADALUPE, NUSE ([id number]) [id number] Page 7 of 10 sitting or standing blood pressure is within target range for patient.. pulse regular and within target range for patient.[name] respirations regular, non-labored and within target range for patient.[name] temperature within target range for patient.. Well-nourished and  well-hydrated in no acute distress. Vitals Time Taken: 1:48 PM, Height: 67 in, Source: Stated, Weight: 162.2 lbs, BMI: 25.4, Temperature: 97.9 F, Pulse: 57 bpm, Respiratory Rate: 20 breaths/min, Blood Pressure: 144/72 mmHg. Eyes conjunctiva clear no eyelid edema noted. pupils equal round and reactive to light and accommodation. Ears, Nose, Mouth, and Throat no gross abnormality of ear auricles or external auditory canals. normal hearing noted during conversation. mucus membranes moist. Respiratory normal breathing without difficulty. Cardiovascular 1+ dorsalis pedis/posterior tibialis pulses. 1+ pitting edema of the bilateral lower extremities. Musculoskeletal normal gait and posture. no significant deformity or arthritic changes, no loss or range of motion, no clubbing. Psychiatric this patient is able to make decisions and demonstrates good insight into disease process. Alert and Oriented x 3. pleasant and cooperative. General Notes: Upon inspection patient's wound bed actually showed signs of good granulation and epithelization at this point. Fortunately I do not see any evidence of active infection locally or systemically which is great news. He does have some necrotic tissue noted that I want to perform some debridement on him and he did tolerate that debridement today without complication. Postdebridement the wound bed appears to be doing significantly better which is  great news I think that he would benefit from a compression wrap obviously he does have some edema here Integumentary (Hair, Skin) Wound #2 status is Open. Original cause of wound was Gradually Appeared. The date acquired was: 02/05/2023. The wound is located on the Right,Medial Lower Leg. The wound measures 1.1cm length x 1cm width x 0.1cm depth; 0.864cm^2 area and 0.086cm^3 volume. Assessment Active Problems ICD-10 Type 2 diabetes mellitus with other skin ulcer Chronic venous hypertension (idiopathic) with inflammation  of right lower extremity Non-pressure chronic ulcer of other part of right lower leg with fat layer exposed Paroxysmal atrial fibrillation Long term (current) use of anticoagulants Chronic combined systolic (congestive) and diastolic (congestive) heart failure Atherosclerotic heart disease of native coronary artery without angina pectoris Procedures Wound #2 Pre-procedure diagnosis of Wound #2 is a Diabetic Wound/Ulcer of the Lower Extremity located on the Right,Medial Lower Leg .Severity of Tissue Pre Debridement is: Fat layer exposed. There was a Excisional Skin/Subcutaneous Tissue Debridement with a total area of 0.86 sq cm performed by Lenda Kelp, PA. With the following instrument(s): Curette to remove Non-Viable tissue/material. Material removed includes Subcutaneous Tissue, Slough, and Biofilm after achieving pain control using Lidocaine 4% Topical Solution. No specimens were taken. A time out was conducted at 14:40, prior to the start of the procedure. A Minimum amount of bleeding was controlled with Pressure. The procedure was tolerated well with a pain level of 0 throughout and a pain level of 0 following the procedure. Post Debridement Measurements: 1.1cm length x 1cm width x 0.1cm depth; 0.086cm^3 volume. Character of Wound/Ulcer Post Debridement is improved. Severity of Tissue Post Debridement is: Fat layer exposed. Post procedure Diagnosis Wound #2: Same as Pre-Procedure General Notes: Scribed for Allen Derry PA by J.Scotton RN. Pre-procedure diagnosis of Wound #2 is a Diabetic Wound/Ulcer of the Lower Extremity located on the Right,Medial Lower Leg . There was a Three Layer Compression Therapy Procedure by Karie Schwalbe, RN. Post procedure Diagnosis Wound #2: Same as Pre-Procedure Notes: Or URGO K2 Lite. Plan Follow-up Appointments: Return Appointment in 1 week. - Jewelene Mairena Room 7 Anesthetic: (In clinic) Topical Lidocaine 4% applied to wound bed Bathing/ Shower/ Hygiene: May  shower with protection but do not get wound dressing(s) wet. Protect dressing(s) with water repellant cover (for example, large plastic bag) or a cast cover and may then take shower. - Please do not get leg wraps wet. Edema Control - Lymphedema / SCD / OtherEDUARDO, MENGE ([id number]) [id number] Page 8 of 10 Elevate legs to the level of the heart or above for 30 minutes daily and/or when sitting for 3-4 times a day throughout the day. Avoid standing for long periods of time. Patient to wear own compression stockings every day. - On left leg and when healed on the right, wear compression on the right leg Exercise regularly - As tolerated Compression stocking or Garment 20-30 mm/Hg pressure to: - Left leg Additional Orders / Instructions: Follow Nutritious Diet - Please include protein supplements. Goal is 70-100g a day. WOUND #2: - Lower Leg Wound Laterality: Right, Medial Cleanser: Soap and Water 1 x Per Week/30 Days Discharge Instructions: May shower and wash wound with dial antibacterial soap and water prior to dressing change. Cleanser: Vashe 5.8 (oz) 1 x Per Week/30 Days Discharge Instructions: Cleanse the wound with Vashe prior to applying a clean dressing using gauze sponges, not tissue or cotton balls. Cleanser: Wound Cleanser 1 x Per Week/30 Days Discharge Instructions: Cleanse the wound with wound cleanser prior  to applying a clean dressing using gauze sponges, not tissue or cotton balls. Peri-Wound Care: Sween Lotion (Moisturizing lotion) 1 x Per Week/30 Days Discharge Instructions: Apply moisturizing lotion as directed Prim Dressing: Hydrofera Blue Ready Transfer Foam, 2.5x2.5 (in/in) 1 x Per Week/30 Days ary Discharge Instructions: Apply directly to wound bed as directed Secondary Dressing: Zetuvit Plus 4x8 in 1 x Per Week/30 Days Discharge Instructions: Apply over primary dressing as directed. Com pression Wrap: ThreePress (3 layer compression  wrap) 1 x Per Week/30 Days Discharge Instructions: Apply three layer compression as directed. Com pression Wrap: Urgo K2 Lite, two layer compression system, regular 1 x Per Week/30 Days Discharge Instructions: OR Apply Urgo K2 Lite as directed (alternative to 3 layer compression). Com pression Wrap: Netting, #5 1 x Per Week/30 Days 1. I would recommend that we have the patient continue to monitor for any signs of infection or worsening. Based on what I am seeing right now I think that he actually does not show any signs of infection which is great news. 2. I am good recommend as well that we go ahead and initiate treatment with a Hydrofera Blue followed by the Zetuvit dressing to follow. With regular continue with the 3 layer compression wrap which is appropriate. If we have the Urgo 2 layer light we can use that instead of just depends. We will see patient back for reevaluation in 1 week here in the clinic. If anything worsens or changes patient will contact our office for additional recommendations. Electronic Signature(s) Signed: 03/30/2023 3:25:28 PM By: Pearletha Alfred Signed: 05/15/2023 3:13:10 PM By: Allen Derry PA-C Previous Signature: 03/09/2023 4:51:53 PM Version By: Shawn Stall RN, [name] Previous Signature: 03/08/2023 2:54:15 PM Version By: Allen Derry PA-C Entered By: Pearletha Alfred on 03/30/2023 15:25:28 -------------------------------------------------------------------------------- HxROS Details Patient Name: Date of Service: [name]. 03/08/2023 1:30 PM Medical Record Number: [id number] Patient Account Number: [id number] Date of Birth/Sex: Treating RN: 05-28-1932 (89 y.o. Tammy Sours Primary Care Provider: Sunnie Nielsen Other Clinician: Referring Provider: Treating Provider/Extender: Donney Rankins Weeks in Treatment: 0 Information Obtained From Patient Constitutional Symptoms (General Health) Medical History: Past Medical History  Notes: 03/01/2023 PA at cardiology worrisome for DVT- DVT study to BLE. negative. has weeping to legs. Eyes Medical History: Negative for: Cataracts; Glaucoma; Optic Neuritis Past Medical History Notes: ARMD Ear/Nose/Mouth/Throat Medical History: Negative for: Chronic sinus problems/congestion; Middle ear problems ALIM, MCFARLAND ([id number]) [id number] Page 9 of 10 Hematologic/Lymphatic Medical History: Positive for: Anemia Negative for: Hemophilia; Human Immunodeficiency Virus; Lymphedema; Sickle Cell Disease Respiratory Medical History: Negative for: Aspiration; Asthma; Chronic Obstructive Pulmonary Disease (COPD); Pneumothorax; Sleep Apnea; Tuberculosis Cardiovascular Medical History: Positive for: Arrhythmia - Afib; Congestive Heart Failure; Coronary Artery Disease; Hypertension; Myocardial Infarction - bypass 1996, 2006 Endocrine Medical History: Positive for: Type II Diabetes Time with diabetes: 10+yrs Treated with: Insulin Blood sugar tested every day: Yes Tested : once in morning Genitourinary Medical History: Negative for: End Stage Renal Disease Past Medical History Notes: CKD III Musculoskeletal Medical History: Positive for: Gout Neurologic Medical History: Positive for: Neuropathy Immunizations Pneumococcal Vaccine: Received Pneumococcal Vaccination: Yes Received Pneumococcal Vaccination On or After 60th Birthday: Yes Implantable Devices None Family and Social History Never smoker; Marital Status - Widowed; Alcohol Use: Never; Drug Use: No History; Caffeine Use: Never; Financial Concerns: No; Food, Clothing or Shelter Needs: No; Support System Lacking: No; Transportation Concerns: No Electronic Signature(s) Signed: 03/08/2023 5:22:52 PM By: Allen Derry PA-C Signed: 03/09/2023 4:51:53 PM By:  Shawn Stall RN, [name] Entered By: Shawn Stall on 03/07/2023  13:45:52 -------------------------------------------------------------------------------- SuperBill Details Patient Name: Date of Service: SALADIN, LAFOY 03/08/2023 Medical Record Number: [id number] Patient Account Number: [id number] Date of Birth/Sex: Treating RN: 05/15/1932 (89 y.o. M) Primary Care Provider: Sunnie Nielsen Other Clinician: Referring Provider: Treating Provider/Extender: Donney Rankins Weeks in Treatment: 0 XAVIEL, THIESEN ([id number]) [id number] Page 10 of 10 Diagnosis Coding ICD-10 Codes Code Description E11.622 Type 2 diabetes mellitus with other skin ulcer I87.321 Chronic venous hypertension (idiopathic) with inflammation of right lower extremity L97.812 Non-pressure chronic ulcer of other part of right lower leg with fat layer exposed I48.0 Paroxysmal atrial fibrillation Z79.01 Long term (current) use of anticoagulants I50.42 Chronic combined systolic (congestive) and diastolic (congestive) heart failure I25.10 Atherosclerotic heart disease of native coronary artery without angina pectoris Facility Procedures : 3 CPT4 Code: 4010272 Description: 11042 - DEB SUBQ TISSUE 20 SQ CM/< ICD-10 Diagnosis Description L97.812 Non-pressure chronic ulcer of other part of right lower leg with fat layer exp Modifier: osed Quantity: 1 Physician Procedures : CPT4 Code Description Modifier [id number] 99214 - WC PHYS LEVEL 4 - EST PT 25 ICD-10 Diagnosis Description E11.622 Type 2 diabetes mellitus with other skin ulcer I87.321 Chronic venous hypertension (idiopathic) with inflammation of right lower extremity  L97.812 Non-pressure chronic ulcer of other part of right lower leg with fat layer exposed I48.0 Paroxysmal atrial fibrillation Quantity: 1 : [id number] 11042 - WC PHYS SUBQ TISS 20 SQ CM ICD-10 Diagnosis Description L97.812 Non-pressure chronic ulcer of other part of right lower leg with fat layer exposed Quantity:  1 Electronic Signature(s) Signed: 03/08/2023 2:54:32 PM By: Allen Derry PA-C Entered By: Allen Derry on 03/08/2023 14:54:32

## 2023-06-09 ENCOUNTER — Telehealth: Payer: Self-pay | Admitting: Family Medicine

## 2023-06-09 ENCOUNTER — Encounter: Payer: Self-pay | Admitting: Family Medicine

## 2023-06-09 MED ORDER — MOLNUPIRAVIR EUA 200MG CAPSULE: 4.0000 | ORAL_CAPSULE | Freq: Two times a day (BID) | ORAL | 0 refills | Status: AC

## 2023-06-09 NOTE — Telephone Encounter (Signed)
Please triage patient about symptoms and let me know. Thanks.

## 2023-06-09 NOTE — Telephone Encounter (Signed)
Would stop amoxil.  Given eliquis use and his renal function, would take molnupiravir.  Rx sent.  If worsening, then needs in person eval.  Thanks.

## 2023-06-09 NOTE — Telephone Encounter (Signed)
Please review and advise.

## 2023-06-09 NOTE — Telephone Encounter (Signed)
Patient has been notified to stop rx and start new one.

## 2023-06-09 NOTE — Telephone Encounter (Signed)
Patient tested positive for covid today He would like to know if he could have an antiviral sent in for him?He said that he don't have a way to come into the office to  be seen or he's not able to get set up for a virtual appointment. Please advise.  [facility] [phone] - [place], [place] - [address] Phone: [phone]  Fax: [phone]

## 2023-06-09 NOTE — Telephone Encounter (Signed)
Called patient states started having symptoms on Monday. They have progressed everyday after that. Did have some afib symptoms on Tuesday and Wednesday. States that his biggest symptom is productive cough. With mucus that is brown. Denies any fever. Tested positive on Wednesday.  Patient has been using nose spray. Patient had some amoxicillin at home started taking one pill bid for last 3 days. Advised him that this will not help Covid. Patient has no drug allergies and verified that his pharmacy is right.

## 2023-06-14 ENCOUNTER — Ambulatory Visit: Payer: Medicare Other | Attending: Cardiovascular Disease | Admitting: Cardiovascular Disease

## 2023-06-22 NOTE — Telephone Encounter (Signed)
error 

## 2023-07-03 ENCOUNTER — Other Ambulatory Visit (HOSPITAL_COMMUNITY): Payer: Self-pay | Admitting: Internal Medicine

## 2023-08-17 ENCOUNTER — Emergency Department (HOSPITAL_COMMUNITY): Payer: Medicare Other

## 2023-08-17 ENCOUNTER — Observation Stay (HOSPITAL_COMMUNITY)
Admission: EM | Admit: 2023-08-17 | Discharge: 2023-08-19 | Disposition: A | Discharge status: Home / Self Care | Payer: Medicare Other | Attending: Internal Medicine | Admitting: Internal Medicine

## 2023-08-17 ENCOUNTER — Other Ambulatory Visit: Payer: Self-pay

## 2023-08-17 DIAGNOSIS — N184 Chronic kidney disease, stage 4 (severe): Secondary | ICD-10-CM | POA: Insufficient documentation

## 2023-08-17 DIAGNOSIS — T18108A Unspecified foreign body in esophagus causing other injury, initial encounter: Secondary | ICD-10-CM | POA: Diagnosis not present

## 2023-08-17 DIAGNOSIS — E7800 Pure hypercholesterolemia, unspecified: Secondary | ICD-10-CM | POA: Diagnosis not present

## 2023-08-17 DIAGNOSIS — Z9049 Acquired absence of other specified parts of digestive tract: Secondary | ICD-10-CM | POA: Diagnosis not present

## 2023-08-17 DIAGNOSIS — E785 Hyperlipidemia, unspecified: Secondary | ICD-10-CM | POA: Diagnosis present

## 2023-08-17 DIAGNOSIS — D631 Anemia in chronic kidney disease: Secondary | ICD-10-CM | POA: Diagnosis not present

## 2023-08-17 DIAGNOSIS — I10 Essential (primary) hypertension: Secondary | ICD-10-CM | POA: Diagnosis not present

## 2023-08-17 DIAGNOSIS — E559 Vitamin D deficiency, unspecified: Secondary | ICD-10-CM | POA: Diagnosis not present

## 2023-08-17 DIAGNOSIS — M109 Gout, unspecified: Secondary | ICD-10-CM | POA: Diagnosis present

## 2023-08-17 DIAGNOSIS — Z7901 Long term (current) use of anticoagulants: Secondary | ICD-10-CM | POA: Diagnosis not present

## 2023-08-17 DIAGNOSIS — M47812 Spondylosis without myelopathy or radiculopathy, cervical region: Secondary | ICD-10-CM | POA: Diagnosis not present

## 2023-08-17 DIAGNOSIS — Z87891 Personal history of nicotine dependence: Secondary | ICD-10-CM | POA: Diagnosis not present

## 2023-08-17 DIAGNOSIS — Z794 Long term (current) use of insulin: Secondary | ICD-10-CM

## 2023-08-17 DIAGNOSIS — R9431 Abnormal electrocardiogram [ECG] [EKG]: Secondary | ICD-10-CM

## 2023-08-17 DIAGNOSIS — M898X9 Other specified disorders of bone, unspecified site: Secondary | ICD-10-CM | POA: Diagnosis not present

## 2023-08-17 DIAGNOSIS — I4821 Permanent atrial fibrillation: Secondary | ICD-10-CM | POA: Diagnosis not present

## 2023-08-17 DIAGNOSIS — E119 Type 2 diabetes mellitus without complications: Secondary | ICD-10-CM

## 2023-08-17 DIAGNOSIS — W44F9XA Other object of natural or organic material, entering into or through a natural orifice, initial encounter: Secondary | ICD-10-CM | POA: Insufficient documentation

## 2023-08-17 DIAGNOSIS — Z7982 Long term (current) use of aspirin: Secondary | ICD-10-CM | POA: Insufficient documentation

## 2023-08-17 DIAGNOSIS — T189XXA Foreign body of alimentary tract, part unspecified, initial encounter: Secondary | ICD-10-CM | POA: Diagnosis not present

## 2023-08-17 DIAGNOSIS — K2970 Gastritis, unspecified, without bleeding: Secondary | ICD-10-CM | POA: Diagnosis not present

## 2023-08-17 DIAGNOSIS — I2510 Atherosclerotic heart disease of native coronary artery without angina pectoris: Secondary | ICD-10-CM | POA: Insufficient documentation

## 2023-08-17 DIAGNOSIS — Z79899 Other long term (current) drug therapy: Secondary | ICD-10-CM | POA: Diagnosis not present

## 2023-08-17 DIAGNOSIS — N189 Chronic kidney disease, unspecified: Secondary | ICD-10-CM | POA: Diagnosis not present

## 2023-08-17 DIAGNOSIS — I129 Hypertensive chronic kidney disease with stage 1 through stage 4 chronic kidney disease, or unspecified chronic kidney disease: Secondary | ICD-10-CM | POA: Diagnosis not present

## 2023-08-17 DIAGNOSIS — Z85828 Personal history of other malignant neoplasm of skin: Secondary | ICD-10-CM | POA: Diagnosis not present

## 2023-08-17 DIAGNOSIS — Q402 Other specified congenital malformations of stomach: Secondary | ICD-10-CM | POA: Insufficient documentation

## 2023-08-17 DIAGNOSIS — R9389 Abnormal findings on diagnostic imaging of other specified body structures: Secondary | ICD-10-CM | POA: Diagnosis not present

## 2023-08-17 DIAGNOSIS — Z951 Presence of aortocoronary bypass graft: Secondary | ICD-10-CM | POA: Diagnosis not present

## 2023-08-17 DIAGNOSIS — Z23 Encounter for immunization: Secondary | ICD-10-CM | POA: Insufficient documentation

## 2023-08-17 DIAGNOSIS — E1122 Type 2 diabetes mellitus with diabetic chronic kidney disease: Secondary | ICD-10-CM | POA: Insufficient documentation

## 2023-08-17 DIAGNOSIS — K269 Duodenal ulcer, unspecified as acute or chronic, without hemorrhage or perforation: Secondary | ICD-10-CM | POA: Diagnosis not present

## 2023-08-17 DIAGNOSIS — I5020 Unspecified systolic (congestive) heart failure: Secondary | ICD-10-CM | POA: Diagnosis present

## 2023-08-17 DIAGNOSIS — K219 Gastro-esophageal reflux disease without esophagitis: Secondary | ICD-10-CM | POA: Diagnosis present

## 2023-08-17 DIAGNOSIS — G4730 Sleep apnea, unspecified: Secondary | ICD-10-CM | POA: Diagnosis present

## 2023-08-17 DIAGNOSIS — I130 Hypertensive heart and chronic kidney disease with heart failure and stage 1 through stage 4 chronic kidney disease, or unspecified chronic kidney disease: Secondary | ICD-10-CM | POA: Insufficient documentation

## 2023-08-17 DIAGNOSIS — I700 Atherosclerosis of aorta: Secondary | ICD-10-CM | POA: Diagnosis not present

## 2023-08-17 DIAGNOSIS — Z03821 Encounter for observation for suspected ingested foreign body ruled out: Secondary | ICD-10-CM | POA: Diagnosis not present

## 2023-08-17 DIAGNOSIS — I4891 Unspecified atrial fibrillation: Secondary | ICD-10-CM | POA: Diagnosis present

## 2023-08-17 NOTE — ED Triage Notes (Signed)
Patient accidentally ingested a soda can tab this evening , airway intact /no emesis . Respirations unlabored / ambulatory .

## 2023-08-18 ENCOUNTER — Emergency Department (HOSPITAL_COMMUNITY): Payer: Medicare Other

## 2023-08-18 ENCOUNTER — Observation Stay (HOSPITAL_BASED_OUTPATIENT_CLINIC_OR_DEPARTMENT_OTHER): Payer: Medicare Other | Admitting: Anesthesiology

## 2023-08-18 ENCOUNTER — Observation Stay (HOSPITAL_COMMUNITY): Payer: Medicare Other | Admitting: Anesthesiology

## 2023-08-18 ENCOUNTER — Encounter (HOSPITAL_COMMUNITY)
Admission: EM | Disposition: A | Discharge status: Home / Self Care | Payer: Self-pay | Source: Home / Self Care | Attending: Emergency Medicine

## 2023-08-18 ENCOUNTER — Encounter (HOSPITAL_COMMUNITY): Payer: Self-pay | Admitting: Family Medicine

## 2023-08-18 DIAGNOSIS — T189XXA Foreign body of alimentary tract, part unspecified, initial encounter: Secondary | ICD-10-CM | POA: Diagnosis present

## 2023-08-18 DIAGNOSIS — R9431 Abnormal electrocardiogram [ECG] [EKG]: Secondary | ICD-10-CM

## 2023-08-18 DIAGNOSIS — M47812 Spondylosis without myelopathy or radiculopathy, cervical region: Secondary | ICD-10-CM | POA: Diagnosis not present

## 2023-08-18 DIAGNOSIS — K269 Duodenal ulcer, unspecified as acute or chronic, without hemorrhage or perforation: Secondary | ICD-10-CM | POA: Diagnosis not present

## 2023-08-18 DIAGNOSIS — I2510 Atherosclerotic heart disease of native coronary artery without angina pectoris: Secondary | ICD-10-CM | POA: Diagnosis not present

## 2023-08-18 DIAGNOSIS — K2970 Gastritis, unspecified, without bleeding: Secondary | ICD-10-CM

## 2023-08-18 DIAGNOSIS — K222 Esophageal obstruction: Secondary | ICD-10-CM

## 2023-08-18 DIAGNOSIS — T18108A Unspecified foreign body in esophagus causing other injury, initial encounter: Secondary | ICD-10-CM | POA: Diagnosis not present

## 2023-08-18 DIAGNOSIS — Z9049 Acquired absence of other specified parts of digestive tract: Secondary | ICD-10-CM | POA: Diagnosis not present

## 2023-08-18 DIAGNOSIS — Z951 Presence of aortocoronary bypass graft: Secondary | ICD-10-CM | POA: Diagnosis not present

## 2023-08-18 DIAGNOSIS — I700 Atherosclerosis of aorta: Secondary | ICD-10-CM | POA: Diagnosis not present

## 2023-08-18 DIAGNOSIS — Z03821 Encounter for observation for suspected ingested foreign body ruled out: Secondary | ICD-10-CM | POA: Diagnosis not present

## 2023-08-18 DIAGNOSIS — R9389 Abnormal findings on diagnostic imaging of other specified body structures: Secondary | ICD-10-CM | POA: Diagnosis not present

## 2023-08-18 HISTORY — PX: ESOPHAGOGASTRODUODENOSCOPY (EGD) WITH PROPOFOL: SHX5813

## 2023-08-18 LAB — CBC WITH DIFFERENTIAL/PLATELET
Abs Immature Granulocytes: 0.02 10*3/uL (ref 0.00–0.07)
Basophils Absolute: 0 10*3/uL (ref 0.0–0.1)
Basophils Relative: 1 %
Eosinophils Absolute: 0.5 10*3/uL (ref 0.0–0.5)
Eosinophils Relative: 7 %
HCT: 36.3 % — ABNORMAL LOW (ref 39.0–52.0)
Hemoglobin: 11.5 g/dL — ABNORMAL LOW (ref 13.0–17.0)
Immature Granulocytes: 0 %
Lymphocytes Relative: 26 %
Lymphs Abs: 1.7 10*3/uL (ref 0.7–4.0)
MCH: 32 pg (ref 26.0–34.0)
MCHC: 31.7 g/dL (ref 30.0–36.0)
MCV: 101.1 fL — ABNORMAL HIGH (ref 80.0–100.0)
Monocytes Absolute: 0.8 10*3/uL (ref 0.1–1.0)
Monocytes Relative: 12 %
Neutro Abs: 3.6 10*3/uL (ref 1.7–7.7)
Neutrophils Relative %: 54 %
Platelets: 131 10*3/uL — ABNORMAL LOW (ref 150–400)
RBC: 3.59 MIL/uL — ABNORMAL LOW (ref 4.22–5.81)
RDW: 14.3 % (ref 11.5–15.5)
WBC: 6.6 10*3/uL (ref 4.0–10.5)
nRBC: 0 % (ref 0.0–0.2)

## 2023-08-18 LAB — BASIC METABOLIC PANEL
Anion gap: 11 (ref 5–15)
BUN: 41 mg/dL — ABNORMAL HIGH (ref 8–23)
CO2: 27 mmol/L (ref 22–32)
Calcium: 9.1 mg/dL (ref 8.9–10.3)
Chloride: 100 mmol/L (ref 98–111)
Creatinine, Ser: 3 mg/dL — ABNORMAL HIGH (ref 0.61–1.24)
GFR, Estimated: 19 mL/min — ABNORMAL LOW (ref >60)
Glucose, Bld: 106 mg/dL — ABNORMAL HIGH (ref 70–99)
Potassium: 3.8 mmol/L (ref 3.5–5.1)
Sodium: 138 mmol/L (ref 135–145)

## 2023-08-18 LAB — LAB REPORT - SCANNED: EGFR: 19

## 2023-08-18 LAB — GLUCOSE, CAPILLARY
Glucose-Capillary: 97 mg/dL (ref 70–99)
Glucose-Capillary: 97 mg/dL (ref 70–99)
Glucose-Capillary: 176 mg/dL — ABNORMAL HIGH (ref 70–99)
Glucose-Capillary: 212 mg/dL — ABNORMAL HIGH (ref 70–99)

## 2023-08-18 LAB — MAGNESIUM: Magnesium: 2.3 mg/dL (ref 1.7–2.4)

## 2023-08-18 SURGERY — ESOPHAGOGASTRODUODENOSCOPY (EGD) WITH PROPOFOL
Anesthesia: General

## 2023-08-18 MED ORDER — PHENYLEPHRINE 80 MCG/ML (10ML) SYRINGE FOR IV PUSH (FOR BLOOD PRESSURE SUPPORT)
PREFILLED_SYRINGE | INTRAVENOUS | Status: DC | PRN
  Administered 2023-08-18 (×2): 80 ug via INTRAVENOUS

## 2023-08-18 MED ORDER — ASPIRIN 81 MG PO TBEC
81.0000 mg | DELAYED_RELEASE_TABLET | Freq: Every day | ORAL | Status: DC
  Administered 2023-08-18 – 2023-08-19 (×2): 81 mg via ORAL
  Filled 2023-08-18 (×2): qty 1

## 2023-08-18 MED ORDER — PANTOPRAZOLE SODIUM 40 MG PO TBEC
40.0000 mg | DELAYED_RELEASE_TABLET | Freq: Two times a day (BID) | ORAL | Status: DC
  Administered 2023-08-18: 40 mg via ORAL
  Filled 2023-08-18 (×2): qty 1

## 2023-08-18 MED ORDER — POLYETHYLENE GLYCOL 3350 17 G PO PACK
17.0000 g | PACK | Freq: Every day | ORAL | Status: DC
  Administered 2023-08-18 – 2023-08-19 (×2): 17 g via ORAL
  Filled 2023-08-18 (×2): qty 1

## 2023-08-18 MED ORDER — PROPOFOL 10 MG/ML IV BOLUS
INTRAVENOUS | Status: DC | PRN
Start: 2023-08-18 — End: 2023-08-18
  Administered 2023-08-18: 80 mg via INTRAVENOUS

## 2023-08-18 MED ORDER — ACETAMINOPHEN 325 MG PO TABS: 650.0000 mg | ORAL_TABLET | Freq: Four times a day (QID) | ORAL | Status: DC | PRN

## 2023-08-18 MED ORDER — LIDOCAINE 2% (20 MG/ML) 5 ML SYRINGE
INTRAMUSCULAR | Status: DC | PRN
  Administered 2023-08-18: 70 mg via INTRAVENOUS

## 2023-08-18 MED ORDER — ONDANSETRON HCL 4 MG/2ML IJ SOLN
INTRAMUSCULAR | Status: DC | PRN
  Administered 2023-08-18: 4 mg via INTRAVENOUS

## 2023-08-18 MED ORDER — RANOLAZINE ER 500 MG PO TB12
500.0000 mg | ORAL_TABLET | Freq: Two times a day (BID) | ORAL | Status: DC
  Administered 2023-08-18 – 2023-08-19 (×3): 500 mg via ORAL
  Filled 2023-08-18 (×5): qty 1

## 2023-08-18 MED ORDER — INSULIN ASPART 100 UNIT/ML IJ SOLN
0.0000 [IU] | Freq: Three times a day (TID) | INTRAMUSCULAR | Status: DC
  Administered 2023-08-19: 3 [IU] via SUBCUTANEOUS
  Administered 2023-08-19: 1 [IU] via SUBCUTANEOUS

## 2023-08-18 MED ORDER — LACTATED RINGERS IV SOLN
INTRAVENOUS | Status: AC | PRN
Start: 2023-08-18 — End: 2023-08-18
  Administered 2023-08-18: 1000 mL via INTRAVENOUS

## 2023-08-18 MED ORDER — LIDOCAINE VISCOUS HCL 2 % MT SOLN
15.0000 mL | Freq: Once | OROMUCOSAL | Status: AC
  Administered 2023-08-18: 15 mL via OROMUCOSAL
  Filled 2023-08-18: qty 15

## 2023-08-18 MED ORDER — FENTANYL CITRATE (PF) 250 MCG/5ML IJ SOLN
INTRAMUSCULAR | Status: DC | PRN
  Administered 2023-08-18: 50 ug via INTRAVENOUS

## 2023-08-18 MED ORDER — TORSEMIDE 20 MG PO TABS
20.0000 mg | ORAL_TABLET | Freq: Two times a day (BID) | ORAL | Status: DC
  Administered 2023-08-19: 20 mg via ORAL
  Filled 2023-08-18 (×2): qty 1

## 2023-08-18 MED ORDER — ONDANSETRON HCL 4 MG/2ML IJ SOLN: 4.0000 mg | Freq: Four times a day (QID) | INTRAMUSCULAR | Status: DC | PRN

## 2023-08-18 MED ORDER — POLYETHYLENE GLYCOL 3350 17 G PO PACK: 17.0000 g | PACK | Freq: Once | ORAL | Status: DC

## 2023-08-18 MED ORDER — FENTANYL CITRATE (PF) 100 MCG/2ML IJ SOLN
INTRAMUSCULAR | Status: AC
  Filled 2023-08-18: qty 2

## 2023-08-18 MED ORDER — ISOSORBIDE MONONITRATE ER 60 MG PO TB24
60.0000 mg | ORAL_TABLET | Freq: Every day | ORAL | Status: DC
  Administered 2023-08-18 – 2023-08-19 (×2): 60 mg via ORAL
  Filled 2023-08-18 (×2): qty 1

## 2023-08-18 MED ORDER — PANTOPRAZOLE SODIUM 40 MG PO TBEC: 40.0000 mg | DELAYED_RELEASE_TABLET | Freq: Every day | ORAL | Status: DC

## 2023-08-18 MED ORDER — METOPROLOL TARTRATE 5 MG/5ML IV SOLN: 5.0000 mg | Freq: Four times a day (QID) | INTRAVENOUS | Status: DC | PRN

## 2023-08-18 MED ORDER — HYDRALAZINE HCL 25 MG PO TABS
25.0000 mg | ORAL_TABLET | Freq: Every day | ORAL | Status: DC
  Administered 2023-08-18 – 2023-08-19 (×2): 25 mg via ORAL
  Filled 2023-08-18 (×2): qty 1

## 2023-08-18 MED ORDER — ACETAMINOPHEN 650 MG RE SUPP: 650.0000 mg | Freq: Four times a day (QID) | RECTAL | Status: DC | PRN

## 2023-08-18 MED ORDER — SUCCINYLCHOLINE CHLORIDE 200 MG/10ML IV SOSY
PREFILLED_SYRINGE | INTRAVENOUS | Status: DC | PRN
  Administered 2023-08-18: 80 mg via INTRAVENOUS

## 2023-08-18 MED ORDER — SODIUM CHLORIDE 0.9% FLUSH: 3.0000 mL | INTRAVENOUS | Status: DC | PRN

## 2023-08-18 MED ORDER — SODIUM CHLORIDE 0.9% FLUSH
3.0000 mL | Freq: Two times a day (BID) | INTRAVENOUS | Status: DC
  Administered 2023-08-18 – 2023-08-19 (×3): 3 mL via INTRAVENOUS

## 2023-08-18 MED ORDER — ROSUVASTATIN CALCIUM 5 MG PO TABS
10.0000 mg | ORAL_TABLET | Freq: Every day | ORAL | Status: DC
  Administered 2023-08-18 – 2023-08-19 (×2): 10 mg via ORAL
  Filled 2023-08-18 (×2): qty 2

## 2023-08-18 MED ORDER — ONDANSETRON HCL 4 MG PO TABS: 4.0000 mg | ORAL_TABLET | Freq: Four times a day (QID) | ORAL | Status: DC | PRN

## 2023-08-18 MED ORDER — SODIUM CHLORIDE 0.9 % IV SOLN: 250.0000 mL | INTRAVENOUS | Status: DC | PRN

## 2023-08-18 MED ORDER — INFLUENZA VAC A&B SURF ANT ADJ 0.5 ML IM SUSY
0.5000 mL | PREFILLED_SYRINGE | INTRAMUSCULAR | Status: AC
  Administered 2023-08-19: 0.5 mL via INTRAMUSCULAR
  Filled 2023-08-18: qty 0.5

## 2023-08-18 MED ORDER — EMPAGLIFLOZIN 10 MG PO TABS
10.0000 mg | ORAL_TABLET | Freq: Every day | ORAL | Status: DC
  Administered 2023-08-19: 10 mg via ORAL
  Filled 2023-08-18: qty 1

## 2023-08-18 MED ORDER — METOPROLOL TARTRATE 50 MG PO TABS
50.0000 mg | ORAL_TABLET | Freq: Two times a day (BID) | ORAL | Status: DC
  Administered 2023-08-18 (×2): 50 mg via ORAL
  Filled 2023-08-18 (×2): qty 1

## 2023-08-18 SURGICAL SUPPLY — 15 items

## 2023-08-18 NOTE — Assessment & Plan Note (Signed)
Optimize electrolytes Keep on telemetry Avoid qt prolonging drugs  Repeat ekg in AM   

## 2023-08-18 NOTE — ED Provider Notes (Signed)
[place] EMERGENCY DEPARTMENT AT [facility] Provider Note   CSN: [id number] Arrival date & time: 08/17/23  2146     History  Chief Complaint  Patient presents with   Ingested Aluminum Can Tab    [name] is a 89 y.o. male.  Swallowed tab off a soda can earlier tonight and feels it lodged in his esophagus.         Home Medications Prior to Admission medications   Medication Sig Start Date End Date Taking? Authorizing Provider  allopurinol (ZYLOPRIM) 100 MG tablet TAKE ONE-HALF (1/2) TABLET EVERY MONDAY, [facility] AND FRIDAY 01/13/23   Joaquim Nam, MD  amoxicillin (AMOXIL) 500 MG tablet Take by mouth. Patient takes 4 tablets prior to going to the dentist 12/28/21   [provider]  aspirin 81 MG tablet Take 81 mg by mouth daily.    [provider]  BD INSULIN SYRINGE ULTRAFINE 31G X 5/16" 0.3 ML MISC USE DAILY AS INSTRUCTED 12/02/15   Joaquim Nam, MD  Cholecalciferol (VITAMIN D) 1000 UNITS capsule Take 1,000 Units by mouth daily.     [provider]  diclofenac Sodium (VOLTAREN) 1 % GEL Apply 2 g topically 4 (four) times daily. 07/20/20   Janace Aris, NP  ELIQUIS 2.5 MG TABS tablet TAKE 1 TABLET TWICE A DAY 12/08/22   Runell Gess, MD  empagliflozin (JARDIANCE) 10 MG TABS tablet TAKE 1 TABLET DAILY BEFORE BREAKFAST (PLEASE CALL THE OFFICE TO ARRANGE YOUR FOLLOW UP APPOINTMENT) 07/03/23   Bensimhon, Bevelyn Buckles, MD  fish oil-omega-3 fatty acids 1000 MG capsule Take 1 g by mouth daily.    [provider]  glucose blood (FREESTYLE LITE) test strip USE TO TEST BLOOD SUGAR ONCE DAILY AND AS NEEDED. Dx E11.9 08/21/22   Joaquim Nam, MD  hydrALAZINE (APRESOLINE) 25 MG tablet Take 1 tablet (25 mg total) by mouth daily. 12/02/22   Runell Gess, MD  ipratropium (ATROVENT) 0.03 % nasal spray USE 2 SPRAYS IN EACH NOSTRIL TWICE A DAY AS NEEDED FOR RHINITIS 03/06/23   Joaquim Nam, MD  isosorbide mononitrate (IMDUR) 60 MG  24 hr tablet Take 1 tablet (60 mg total) by mouth daily. 03/01/23   Tereso Newcomer T, PA-C  LANTUS 100 UNIT/ML injection INJECT 20 TO 25 UNITS AT BEDTIME 08/15/22   Joaquim Nam, MD  magnesium oxide (MAG-OX) 400 MG tablet Take 1 tablet (400 mg total) by mouth every other day. 04/13/23   Joaquim Nam, MD  metoprolol tartrate (LOPRESSOR) 50 MG tablet TAKE 1 TABLET(50 MG) BY MOUTH TWICE DAILY 02/22/23   Runell Gess, MD  Multiple Vitamin (MULTIVITAMIN WITH MINERALS) TABS Take 1 tablet by mouth daily.    [provider]  nitroGLYCERIN (NITROSTAT) 0.4 MG SL tablet DISSOLVE 1 TABLET UNDER THE TONGUE EVERY 5 MINUTES AS NEEDED FOR CHEST PAIN FOR A MAXIMUM OF 3 DOSES 07/01/22   Runell Gess, MD  polyethylene glycol (MIRALAX / GLYCOLAX) 17 g packet Take 17 g by mouth daily as needed for mild constipation.    [provider]  Potassium Gluconate 595 MG CAPS Take 595 mg by mouth daily.    [provider]  ranolazine (RANEXA) 500 MG 12 hr tablet TAKE 1 TABLET TWICE A DAY 03/08/23   Runell Gess, MD  rosuvastatin (CRESTOR) 10 MG tablet Take 1 tablet (10 mg total) by mouth daily. 03/01/23   Tereso Newcomer T, PA-C  torsemide (DEMADEX) 20 MG  tablet Take 2 tablets (40 mg total) every morning and 1 tablet (20 mg total) every evening. 03/16/23   Tereso Newcomer T, PA-C  traMADol (ULTRAM) 50 MG tablet TAKE 1 TABLET EVERY 6 HOURS AS NEEDED 08/31/22   Joaquim Nam, MD  valACYclovir (VALTREX) 1000 MG tablet Take by mouth. Used as needed for a cold sore. 08/30/22   [provider]      Allergies    Patient has no known allergies.    Review of Systems   Review of Systems  Physical Exam Updated Vital Signs BP (!) 157/81   Pulse 68   Temp 98.3 F (36.8 C) (Oral)   Resp 15   SpO2 100%  Physical Exam Vitals and nursing note reviewed.  Constitutional:      Appearance: He is well-developed.  HENT:     Head: Normocephalic and atraumatic.  Neck:     Comments: Ttp  anterior neck between clavicles, tolerating secretions Cardiovascular:     Rate and Rhythm: Normal rate.  Pulmonary:     Effort: Pulmonary effort is normal. No respiratory distress.  Abdominal:     General: There is no distension.  Musculoskeletal:        General: Normal range of motion.     Cervical back: Normal range of motion.  Neurological:     Mental Status: He is alert.     ED Results / Procedures / Treatments   Labs (all labs ordered are listed, but only abnormal results are displayed) Labs Reviewed - No data to display  EKG None  Radiology DG Neck Soft Tissue  Result Date: 08/18/2023 CLINICAL DATA:  Accidental soda tab ingestion, initial encounter EXAM: NECK SOFT TISSUES - 1+ VIEW COMPARISON:  None Available. FINDINGS: No prevertebral soft tissue swelling is noted. Degenerative changes of the cervical spine are seen. Radiopaque foreign body is noted within the proximal cervical esophagus which corresponds to the history of ingestion soda tab. It is better visualized on the lateral projection than on the frontal film. IMPRESSION: Ingestion soda tab in the proximal cervical esophagus. This is best visualized on the lateral projection. Electronically Signed   By: Alcide Clever M.D.   On: 08/18/2023 02:21   DG Abd 2 Views  Result Date: 08/17/2023 CLINICAL DATA:  Swallowed soda top. EXAM: ABDOMEN - 2 VIEW COMPARISON:  None Available. FINDINGS: Prior CABG. Density projects over the left lower chest, presumably surgical clips. Heart is normal size. Aortic atherosclerosis. No confluent airspace opacities or effusions. Prior cholecystectomy. No unexpected radiopaque foreign body in the abdomen or pelvis. Diffuse aortic atherosclerosis. No bowel obstruction or free air. IMPRESSION: Radiopaque density projects over the left lower chest, presumably surgical clips. Otherwise no unexpected radiopaque foreign body. No acute findings. Electronically Signed   By: Charlett Nose M.D.   On:  08/17/2023 23:58    Procedures Procedures    Medications Ordered in ED Medications  lidocaine (XYLOCAINE) 2 % viscous mouth solution 15 mL (15 mLs Mouth/Throat Given 08/18/23 0139)    ED Course/ Medical Decision Making/ A&P Clinical Course as of 08/19/23 0501  Fri Aug 18, 2023  0927 No obvious foreign body visualized on XR. Patient reports feels intermittent mid chest pain and may have moved down the esophagus. I spoke Dr. Barron Alvine of GI who recommended admission for obs but to keep NPO as they may still scope today. He will be down to see the patient. [VK]    Clinical Course User Index [VK] Rexford Maus, DO  Medical Decision Making Amount and/or Complexity of Data Reviewed Labs: ordered. Radiology: ordered. ECG/medicine tests: ordered.  Risk Prescription drug management. Decision regarding hospitalization.   Low concern for esophageal laceration, less likely Boerhaave's versus foreign body.  Initial x-ray was viewed interpreted by myself with multiple radiopaque objects but all attributable to the heart.  Radiology read reviewed.  Patient was insistent that he swallowed something and he could feel in his throat.  Given lidocaine and got a little bit better.  Got out x-ray of his upper trachea and esophagus just to be sure.  On my initial interpretation I did not see a foreign body however radiology noted foreign body in the proximal/mid esophagus consistent with his story of a soda tab.  No surrounding free air to suggest perforation.  Patient is vitally stable.  He will tolerate secretions.  He is seen [facility] in the past so we will consult them for consideration of EGD.  Paged at 0317, No callback in over an hour, will repage.  Paged at 0423, No callback after another hour, will repage.   No call back after another 15 minutes, paged again at 0533. Also tried to chat on Epic securechat with no response.   Received call at 0547. Will  take to endoscopy. Will need general anesthesia so will check labs and ECG.   Dr. Chales Abrahams called back around [phone] to say that ENT needed to do it.   Spoke with Dr. Jearld Fenton who, without seeing the patient or the x-ray, states that "our equipment is in disarray and we do not have the ability to take out foreign bodies in this hospital" he suggested I call [facility] to see if they would accept transfer for removal.  I asked him 1 more time if he was sure that we could not do this here and that the patient absolutely needed to be transferred, he stated that was accurate.   Will consult [facility] called back and pending ENT call back. Patient ok with transfer.   I discussed case with Dr. Chesley Mires with GI at [facility].  He stated that it was not really an appropriate transfer since we have the specialists here and they did not even attempt to take care of the patient.  He stated that he felt like with those 2 specialties that they should be able to either go to the OR or endoscopy suite together and come up with a plan to get the foreign body out.  I agree with the this and plan to reach out to Drs. Jearld Fenton and [facility].  However at this point patient states he feels like it may have moved a little bit.  Will get a repeat x-ray to evaluate position and whether or not a procedure still indicated.  Care transferred pending same.  Final Clinical Impression(s) / ED Diagnoses Final diagnoses:  Foreign body in esophagus, initial encounter    Rx / DC Orders ED Discharge Orders     None         [place] Dresser, Barbara Cower, MD 08/19/23 0501

## 2023-08-18 NOTE — Assessment & Plan Note (Signed)
Cpap nightly, but doesn't use nightly Declines while in patient

## 2023-08-18 NOTE — Assessment & Plan Note (Signed)
Euvolemic  Continue demadex, lopressor Daily weights/strict I/O

## 2023-08-18 NOTE — ED Notes (Signed)
Patient transported to X-ray 

## 2023-08-18 NOTE — Assessment & Plan Note (Signed)
Continue medical management  ASA, lopressor, imdur, crestor, ranexa

## 2023-08-18 NOTE — Assessment & Plan Note (Signed)
A1c of 6.9 in 03/2023 Continue jardiance On 20 units of long acting insulin. Last took 08/16/23. Sugars well controlled. Hold for now and continue SSI while NPO

## 2023-08-18 NOTE — Assessment & Plan Note (Signed)
Continue eliquis, lopressor

## 2023-08-18 NOTE — Anesthesia Postprocedure Evaluation (Signed)
Anesthesia Post Note  Patient: [name]  Procedure(s) Performed: ESOPHAGOGASTRODUODENOSCOPY (EGD) WITH PROPOFOL     Patient location during evaluation: PACU Anesthesia Type: General Level of consciousness: sedated and patient cooperative Pain management: pain level controlled Vital Signs Assessment: post-procedure vital signs reviewed and stable Respiratory status: spontaneous breathing Cardiovascular status: stable Anesthetic complications: no   There were no known notable events for this encounter.  Last Vitals:  Vitals:   08/18/23 1305 08/18/23 1615  BP: (!) 179/73 (!) 143/80  Pulse: 92 64  Resp: 16 18  Temp: 36.6 C 36.5 C  SpO2: 95% 99%    Last Pain:  Vitals:   08/18/23 1615  TempSrc: Oral  PainSc:                  Lewie Loron

## 2023-08-18 NOTE — Anesthesia Preprocedure Evaluation (Addendum)
Anesthesia Evaluation  Patient identified by MRN, date of birth, ID band Patient awake    Reviewed: Allergy & Precautions, NPO status , Patient's Chart, lab work & pertinent test results  Airway Mallampati: III  TM Distance: >3 FB Neck ROM: Limited    Dental  (+) Dental Advisory Given, Teeth Intact   Pulmonary sleep apnea and Continuous Positive Airway Pressure Ventilation , pneumonia, former smoker   Pulmonary exam normal breath sounds clear to auscultation       Cardiovascular hypertension, pulmonary hypertension+ CAD, + Past MI, + Cardiac Stents, + CABG, +CHF and + DOE  + dysrhythmias Atrial Fibrillation + Valvular Problems/Murmurs MR  Rhythm:Regular Rate:Normal + Systolic murmurs Echo 11/2022  1. Left ventricular ejection fraction, by estimation, is 35 to 40%. The left ventricle has moderately decreased function. The left ventricle demonstrates regional wall motion abnormalities (inferior, inferoseptal and basal to mid septal wall hypokinesis). There is mild to moderate left ventricular hypertrophy. Left ventricular diastolic parameters are indeterminate.   2. Right ventricular systolic function is mildly reduced. The right ventricular size is moderately enlarged. There is severely elevated pulmonary artery systolic pressure. The estimated right ventricular systolic pressure is 60.7 mmHg.   3. Left atrial size was moderately dilated.   4. The mitral valve is normal in structure. Mild mitral valve regurgitation. No evidence of mitral stenosis. There is a Mitra-Clip present in the mitral position. Procedure Date: 02/06/20.   5. Tricuspid valve regurgitation is moderate.   6. The aortic valve is tricuspid. There is moderate calcification of the aortic valve. Aortic valve regurgitation is not visualized. No aortic stenosis is present. Aortic valve area, by VTI measures 0.87 cm. Aortic valve mean gradient measures 8.2  mmHg. Aortic valve Vmax  measures 1.94 m/s.   7. The inferior vena cava is normal in size with greater than 50% respiratory variability, suggesting right atrial pressure of 3 mmHg.   8. There is a small atrial septal defect with predominantly left to right shunting across the atrial septum.   Echo 2023  1. Hypokinesis /akiensis of the inferior wall (base/mid). Left ventricular ejection fraction, by estimation, is 505%. The left ventricle has normal function. There is mild left ventricular hypertrophy. Left ventricular diastolic parameters are indeterminate.   2. Right ventricular systolic function is low normal. The right ventricular size is mildly enlarged. There is moderately elevated pulmonary artery systolic pressure.   3. Left atrial size was mild to moderately dilated.   4. Right atrial size was moderately dilated.   5. S/p Mitra Clip (02/06/20) The clip appears well seatad. Peak and mean gradients through the valve arer 13 and 5 mm Hg( HR 68 bpm). This is relatively unchanged from echo report from 2020. Mild mitral valve regurgitation. There is a Mitra-Clip present in the mitral position. Procedure Date: 02/06/2020.   6. AV is thickened with minimally restricted motion. Peak and mean gradients through the valve aer 18 and 10 mm HG respectively Acoustic windows diffilcult. Not clear on accurate measures of LVOT to make further calculations. . The aortic valve is tricuspid. Aortic valve regurgitation is mild.   7. Aortic dilatation noted. There is mild dilatation of the ascending aorta, measuring 39 mm.   8. The inferior vena cava is dilated in size with <50% respiratory variability, suggesting right atrial pressure of 15 mmHg.   9. Evidence of atrial level shunting detected by color flow Doppler. There is a large patent foramen ovale with bidirectional shunting across  atrial septum.  Neuro/Psych negative neurological ROS  negative psych ROS   GI/Hepatic Neg liver ROS,GERD  ,,  Endo/Other  diabetes     Renal/GU Renal disease     Musculoskeletal  (+) Arthritis ,    Abdominal   Peds  Hematology  (+) Blood dyscrasia, anemia   Anesthesia Other Findings   Reproductive/Obstetrics negative OB ROS                             Anesthesia Physical Anesthesia Plan  ASA: 4  Anesthesia Plan: General   Post-op Pain Management: Minimal or no pain anticipated   Induction: Intravenous  PONV Risk Score and Plan: 2 and Ondansetron, Dexamethasone and Treatment may vary due to age or medical condition  Airway Management Planned: Oral ETT  Additional Equipment:   Intra-op Plan:   Post-operative Plan: Extubation in OR  Informed Consent: I have reviewed the patients History and Physical, chart, labs and discussed the procedure including the risks, benefits and alternatives for the proposed anesthesia with the patient or authorized representative who has indicated his/her understanding and acceptance.     Dental advisory given  Plan Discussed with: CRNA  Anesthesia Plan Comments: (PAT note written by Shonna Chock, PA-C. )        Anesthesia Quick Evaluation

## 2023-08-18 NOTE — Assessment & Plan Note (Signed)
Followed by nephrology, Dr. Allena Katz Baseline creatinine appears to be around 3.2-3.4 Stable, continue to monitor

## 2023-08-18 NOTE — H&P (Signed)
History and Physical    Patient: [name] WGN:[id number] DOB: 24-Feb-1932 DOA: 08/17/2023 DOS: the patient was seen and examined on 08/18/2023 PCP: Joaquim Nam, MD  Patient coming from: Home - lives alone. Ambulates with cane.    Chief Complaint: accidental ingestion of foreign body. Swallowed soda can tab  HPI: [name] is a 89 y.o. male with medical history significant of atrial fibrillation on eliquis , CAD s/p CABG, CHF, CKD stage 4, T2DM, HTN, HLD, gout, s/p mitral valve repair and OSA on cpap who presented to ED with complaints of swallowing a soda can tab and feeling like it was stuck in his throat. He states around 9pm he swallowed the tab and it felt stuck. He could breath fine.  After coming to ED he dozed off and felt better when he woke up which had them re xray him.  He states it is still sore to swallow,but no longer feels like something is rubbing against his throat. Initial neck xray around 2AM showed foreign body in proximal cervical esophagus. On repeat around 8AM, this was no longer visualized.   Last dose of eliquis at 08/17/23 at 8AM.   He has been feeling good. Denies any fever/chills, vision changes/headaches, chest pain or palpitations, shortness of breath or cough, abdominal pain, N/V/D, dysuria or leg swelling.    He does not smoke or drink alcohol.   ER Course:  vitals: afebrile, bp: 157/70, HR: 59, RR: 18, oxygen: 100%RA Pertinent labs: hgb: 11.5, MCV: 101, platelets 131, BUN: 41, creatinine: 3.00  Neck and soft tissue xray initial: Ingestion soda tab in the proximal cervical esophagus. This is best visualized on the lateral projection. Repeat: no longer visualized  CXR: no foreign body on xray  Abdomen xray: no definite ingested radiopaque foreign body seen.  In ED: ENT initially called, but recommended transfer to [facility] due to not having capabilities to remove. After he swallowed GI called and plans for EGD today.    Review of Systems: As  mentioned in the history of present illness. All other systems reviewed and are negative. Past Medical History:  Diagnosis Date   Age-related macular degeneration, wet, both eyes ([name])    Anemia    Secondary to acute blood loss   Anginal pain ([name])    last chest pain in Feb 2021   Arthritis    "mild in hands, knees, ankles" (11/13/2015)   Atrial fibrillation ([name])    Consideration was given for atrial flutter ablation, but patient developed atrial fibrillation. Cardioversion was done. Dr. Graciela Husbands decided to watch him clinically. November, 2011   Atrial flutter [facility]) 07/2010   September, 2011   Hospital with PNA and cath done.Marland KitchenMarland KitchenCoumadin.  Atrial flutter ablation planned, but  pt. then had atrial fibrillation,/outpatient conversion 09/08/10..NSR..plan to follow..Dr. Graciela Husbands   CAD (coronary artery disease)    Catheterization, September, 2011,  grafts patent from redo CABG,, medical therapy of coronary disease, consideration to proceeding with atrial flutter ablation   Carotid artery disease ([name])    Doppler 09/18/2009 - 49% bilateral stenoses   Carotid artery disease ([name])    49% bilateral, Doppler, November, 2010   CHF (congestive heart failure) ([name])    Chronic kidney disease (CKD), stage III (moderate) ([name])    Diabetic peripheral neuropathy ([name])    feet   Diverticulosis of colon with hemorrhage 2009   several unit diverticular bleed    Erectile dysfunction    Mild   Gout    Hearing loss  wears hearing aids   History of blood transfusion "several"   related to diverticular bleeding   Hyperlipidemia    Hypertension    Mitral regurgitation    Mild, echo, September, 2011   NSTEMI (non-ST elevated myocardial infarction) ([name]) 07/2010   at [facility] with repeat cath, rec medical mgmt    OSA on CPAP    uses CPAP nightly   Personal history of colonic polyps    PNA (pneumonia) 9/11   NSTEMI at [facility] with repeat cath, rec medical mgmt    RBBB (right bundle branch block)    S/P mitral valve  repair 02/06/2020   s/p TEER with a single MitraClip NTW placed on A2/P2   Shoulder pain    "positional; better now" (11/13/2015)   Skin cancer    R lower leg, per derm 2012   Type II diabetes mellitus ([name])    Past Surgical History:  Procedure Laterality Date   CARDIAC CATHETERIZATION  2006   Nuclear..slight lateral ischemia..medical therapy   CARDIAC CATHETERIZATION  08/04/2010   grafts patent from redo CABG...medical Rx and ablate Atrial flutter (LV not injected)    CARDIOVERSION  ~ 2010   CAROTID ENDARTERECTOMY Right 1994   CATARACT EXTRACTION W/ INTRAOCULAR LENS  IMPLANT, BILATERAL Bilateral    COLON RESECTION N/A 01/13/2016   Procedure: EXPLORATORY LAPAROTOMY, LEFT AND SIGMOID COLON REMOVAL;  Surgeon: Axel Filler, MD;  Location: [facility] OR;  Service: General;  Laterality: N/A;  Extended open left hemicolectomy and sigmoidectomy    COLONOSCOPY N/A 11/14/2015   Procedure: COLONOSCOPY;  Surgeon: Ruffin Frederick, MD;  Location: [facility] ENDOSCOPY;  Service: Gastroenterology;  Laterality: N/A;   COLONOSCOPY Left 01/05/2016   Procedure: COLONOSCOPY;  Surgeon: Ruffin Frederick, MD;  Location: [facility] ENDOSCOPY;  Service: Gastroenterology;  Laterality: Left;  no sedation to start, moderate if needed   COLONOSCOPY W/ POLYPECTOMY     CORONARY ARTERY BYPASS GRAFT  1995; 2006   "X 3; X3"   CORONARY ARTERY BYPASS GRAFT  1995, 2006   DOPPLER ECHOCARDIOGRAPHY  08/2008   EF 60%   DOPPLER ECHOCARDIOGRAPHY  08/02/2010   65-70%   DOPPLER ECHOCARDIOGRAPHY  07/2010   MR mild   ESOPHAGOGASTRODUODENOSCOPY N/A 06/19/2014   Procedure: ESOPHAGOGASTRODUODENOSCOPY (EGD);  Surgeon: Beverley Fiedler, MD;  Location: [facility] ENDOSCOPY;  Service: Endoscopy;  Laterality: N/A;   ESOPHAGOGASTRODUODENOSCOPY N/A 11/14/2015   Procedure: ESOPHAGOGASTRODUODENOSCOPY (EGD);  Surgeon: Ruffin Frederick, MD;  Location: [facility] ENDOSCOPY;  Service: Gastroenterology;  Laterality: N/A;   FLEXIBLE SIGMOIDOSCOPY N/A 11/16/2015   Procedure:  FLEXIBLE SIGMOIDOSCOPY;  Surgeon: Ruffin Frederick, MD;  Location: [facility] ENDOSCOPY;  Service: Gastroenterology;  Laterality: N/A;   LAPAROSCOPIC CHOLECYSTECTOMY  2008   LEFT HEART CATH N/A 06/14/2014   Procedure: LEFT HEART CATH;  Surgeon: Lesleigh Noe, MD;  Location: [facility] CATH LAB;  Service: Cardiovascular;  Laterality: N/A;   LEFT HEART CATH AND CORONARY ANGIOGRAPHY N/A 08/02/2018   Procedure: LEFT HEART CATH AND CORONARY ANGIOGRAPHY;  Surgeon: Runell Gess, MD;  Location: [facility] INVASIVE CV LAB;  Service: Cardiovascular;  Laterality: N/A;   LEFT HEART CATHETERIZATION WITH CORONARY/GRAFT ANGIOGRAM N/A 06/11/2014   Procedure: LEFT HEART CATHETERIZATION WITH Isabel Caprice;  Surgeon: Lesleigh Noe, MD;  Location: [facility] CATH LAB;  Service: Cardiovascular;  Laterality: N/A;   MITRAL VALVE REPAIR N/A 02/06/2020   Procedure: MITRAL VALVE REPAIR;  Surgeon: Tonny Bollman, MD;  Location: [facility] INVASIVE CV LAB;  Service: Cardiovascular;  Laterality: N/A;   PERCUTANEOUS CORONARY STENT  INTERVENTION (PCI-S) N/A 06/13/2014   Procedure: PERCUTANEOUS CORONARY STENT INTERVENTION (PCI-S);  Surgeon: Lesleigh Noe, MD;  Location: [facility] CATH LAB;  Service: Cardiovascular;  Laterality: N/A;   RIGHT HEART CATH N/A 01/06/2020   Procedure: RIGHT HEART CATH;  Surgeon: Dolores Patty, MD;  Location: [facility] INVASIVE CV LAB;  Service: Cardiovascular;  Laterality: N/A;   SKIN CANCER EXCISION Left 10/2015   calf   SKIN CANCER EXCISION Right 2014?   chest   TEE WITHOUT CARDIOVERSION N/A 01/06/2020   Procedure: TRANSESOPHAGEAL ECHOCARDIOGRAM (TEE);  Surgeon: Dolores Patty, MD;  Location: [facility] ENDOSCOPY;  Service: Cardiovascular;  Laterality: N/A;   TONSILLECTOMY     VASECTOMY     Social History:  reports that he quit smoking about 66 years ago. His smoking use included cigarettes. He started smoking about 74 years ago. He has a 8 pack-year smoking history. He has never used smokeless tobacco. He reports that  he does not drink alcohol and does not use drugs.  No Known Allergies  Family History  Problem Relation Age of Onset   Kidney disease Mother        Kidney failure   Stroke Mother    Diabetes Mother    Heart disease Father        MI   Arthritis Sister    Cancer Sister        Throat   Heart disease Sister        MI   Diabetes Sister    Prostate cancer Neg Hx    Colon cancer Neg Hx     Prior to Admission medications   Medication Sig Start Date End Date Taking? Authorizing Provider  allopurinol (ZYLOPRIM) 100 MG tablet TAKE ONE-HALF (1/2) TABLET EVERY MONDAY, [facility] AND FRIDAY 01/13/23  Yes Joaquim Nam, MD  amoxicillin (AMOXIL) 500 MG tablet Take by mouth. Patient takes 4 tablets prior to going to the dentist 12/28/21  Yes [provider]  aspirin 81 MG tablet Take 81 mg by mouth daily.   Yes [provider]  Cholecalciferol (VITAMIN D) 1000 UNITS capsule Take 1,000 Units by mouth daily.    Yes [provider]  diclofenac Sodium (VOLTAREN) 1 % GEL Apply 2 g topically 4 (four) times daily. 07/20/20  Yes Bast, Traci A, NP  ELIQUIS 2.5 MG TABS tablet TAKE 1 TABLET TWICE A DAY 12/08/22  Yes Runell Gess, MD  empagliflozin (JARDIANCE) 10 MG TABS tablet TAKE 1 TABLET DAILY BEFORE BREAKFAST (PLEASE CALL THE OFFICE TO ARRANGE YOUR FOLLOW UP APPOINTMENT) 07/03/23  Yes Bensimhon, Bevelyn Buckles, MD  fish oil-omega-3 fatty acids 1000 MG capsule Take 1 g by mouth daily.   Yes [provider]  hydrALAZINE (APRESOLINE) 25 MG tablet Take 1 tablet (25 mg total) by mouth daily. 12/02/22  Yes Runell Gess, MD  ipratropium (ATROVENT) 0.03 % nasal spray USE 2 SPRAYS IN EACH NOSTRIL TWICE A DAY AS NEEDED FOR RHINITIS 03/06/23  Yes Joaquim Nam, MD  isosorbide mononitrate (IMDUR) 60 MG 24 hr tablet Take 1 tablet (60 mg total) by mouth daily. 03/01/23  Yes Weaver, Scott T, PA-C  LANTUS 100 UNIT/ML injection INJECT 20 TO 25 UNITS AT BEDTIME 08/15/22  Yes Joaquim Nam, MD  magnesium oxide (MAG-OX) 400 MG tablet Take 1 tablet (400 mg total) by mouth every other day. 04/13/23  Yes Joaquim Nam, MD  metoprolol tartrate (LOPRESSOR) 50 MG tablet TAKE 1 TABLET(50 MG) BY MOUTH TWICE DAILY 02/22/23  Yes Runell Gess, MD  Multiple Vitamin (MULTIVITAMIN WITH MINERALS) TABS Take 1 tablet by mouth daily.   Yes [provider]  nitroGLYCERIN (NITROSTAT) 0.4 MG SL tablet DISSOLVE 1 TABLET UNDER THE TONGUE EVERY 5 MINUTES AS NEEDED FOR CHEST PAIN FOR A MAXIMUM OF 3 DOSES 07/01/22  Yes Runell Gess, MD  polyethylene glycol (MIRALAX / GLYCOLAX) 17 g packet Take 17 g by mouth daily.   Yes [provider]  Potassium Gluconate 595 MG CAPS Take 595 mg by mouth daily.   Yes [provider]  ranolazine (RANEXA) 500 MG 12 hr tablet TAKE 1 TABLET TWICE A DAY 03/08/23  Yes Runell Gess, MD  rosuvastatin (CRESTOR) 10 MG tablet Take 1 tablet (10 mg total) by mouth daily. 03/01/23  Yes Weaver, Scott T, PA-C  torsemide (DEMADEX) 20 MG tablet Take 2 tablets (40 mg total) every morning and 1 tablet (20 mg total) every evening. 03/16/23  Yes Weaver, Scott T, PA-C  valACYclovir (VALTREX) 1000 MG tablet Take by mouth. Used as needed for a cold sore. 08/30/22  Yes [provider]  BD INSULIN SYRINGE ULTRAFINE 31G X 5/16" 0.3 ML MISC USE DAILY AS INSTRUCTED 12/02/15   Joaquim Nam, MD  glucose blood (FREESTYLE LITE) test strip USE TO TEST BLOOD SUGAR ONCE DAILY AND AS NEEDED. Dx E11.9 08/21/22   Joaquim Nam, MD    Physical Exam: Vitals:   08/18/23 0630 08/18/23 0700 08/18/23 1002 08/18/23 1043  BP: (!) 164/66 (!) 157/68 (!) 170/101 (!) 164/63  Pulse: (!) 40 (!) 55 83 88  Resp: 16 16 16 15   Temp:   98 F (36.7 C) 97.8 F (36.6 C)  TempSrc:    Temporal  SpO2: 100% 97% 100% 99%  Weight:    71.2 kg   General:  Appears calm and comfortable and is in NAD Eyes:  PERRL, EOMI, normal lids, iris ENT:  HOH, lips & tongue, mmm; appropriate  dentition Neck:  no LAD, masses or thyromegaly; no carotid bruits Cardiovascular:  Regularly irregular, +systolic murmur. 1-2+ pitting edema above ankles, L>R Respiratory:   CTA bilaterally with no wheezes/rales/rhonchi.  Normal respiratory effort. Abdomen:  soft, NT, ND, NABS. Reducible, ventral hernia  Back:   normal alignment, no CVAT Skin:  no rash or induration seen on limited exam Musculoskeletal:  grossly normal tone BUE/BLE, good ROM, no bony abnormality Lower extremity:  Limited foot exam with no ulcerations.  2+ distal pulses. Psychiatric:  grossly normal mood and affect, speech fluent and appropriate, AOx3 Neurologic:  CN 2-12 grossly intact, moves all extremities in coordinated fashion, sensation intact   Radiological Exams on Admission: Independently reviewed - see discussion in A/P where applicable  DG Abd 1 View  Result Date: 08/18/2023 CLINICAL DATA:  Ingested foreign body. EXAM: ABDOMEN - 1 VIEW COMPARISON:  August 17, 2023. FINDINGS: The bowel gas pattern is normal. Status post cholecystectomy. No definite evidence of ingested radiopaque foreign body. No radio-opaque calculi or other significant radiographic abnormality are seen. IMPRESSION: No definite evidence of ingested radiopaque foreign body. Aortic Atherosclerosis (ICD10-I70.0). Electronically Signed   By: Lupita Raider M.D.   On: 08/18/2023 08:27   DG Chest 2 View  Result Date: 08/18/2023 CLINICAL DATA:  Swallowed foreign body. EXAM: CHEST - 2 VIEW COMPARISON:  December 06, 2022. FINDINGS: Stable cardiomediastinal silhouette. Status post coronary artery bypass graft. Elevated left hemidiaphragm is noted. Both lungs are clear. The visualized skeletal structures are unremarkable. IMPRESSION: No active cardiopulmonary  disease. No definite evidence of ingested foreign body on these radiographs. Aortic Atherosclerosis (ICD10-I70.0). Electronically Signed   By: Lupita Raider M.D.   On: 08/18/2023 08:24   DG Neck Soft  Tissue  Result Date: 08/18/2023 CLINICAL DATA:  Ingested foreign body. EXAM: NECK SOFT TISSUES - 1+ VIEW COMPARISON:  Same day. FINDINGS: There is no evidence of retropharyngeal soft tissue swelling or epiglottic enlargement. The cervical airway is unremarkable and no radio-opaque foreign body identified. The ingested soda tab noted on prior radiograph is no longer visualized. Carotid artery calcifications and surgical staples are seen in the right sided cervical tissues. IMPRESSION: Ingested soda tab noted on prior radiograph is no longer visualized. Electronically Signed   By: Lupita Raider M.D.   On: 08/18/2023 08:21   DG Neck Soft Tissue  Result Date: 08/18/2023 CLINICAL DATA:  Accidental soda tab ingestion, initial encounter EXAM: NECK SOFT TISSUES - 1+ VIEW COMPARISON:  None Available. FINDINGS: No prevertebral soft tissue swelling is noted. Degenerative changes of the cervical spine are seen. Radiopaque foreign body is noted within the proximal cervical esophagus which corresponds to the history of ingestion soda tab. It is better visualized on the lateral projection than on the frontal film. IMPRESSION: Ingestion soda tab in the proximal cervical esophagus. This is best visualized on the lateral projection. Electronically Signed   By: Alcide Clever M.D.   On: 08/18/2023 02:21   DG Abd 2 Views  Result Date: 08/17/2023 CLINICAL DATA:  Swallowed soda top. EXAM: ABDOMEN - 2 VIEW COMPARISON:  None Available. FINDINGS: Prior CABG. Density projects over the left lower chest, presumably surgical clips. Heart is normal size. Aortic atherosclerosis. No confluent airspace opacities or effusions. Prior cholecystectomy. No unexpected radiopaque foreign body in the abdomen or pelvis. Diffuse aortic atherosclerosis. No bowel obstruction or free air. IMPRESSION: Radiopaque density projects over the left lower chest, presumably surgical clips. Otherwise no unexpected radiopaque foreign body. No acute findings.  Electronically Signed   By: Charlett Nose M.D.   On: 08/17/2023 23:58    EKG: Independently reviewed.  Atrial fib with rate 77; nonspecific ST changes with no evidence of acute ischemia. Prolonged QT   Labs on Admission: I have personally reviewed the available labs and imaging studies at the time of the admission.  Pertinent labs:   hgb: 11.5,  MCV: 101,  platelets 131,  BUN: 41,  creatinine: 3.00   Assessment and Plan: Principal Problem:   Foreign body ingestion, initial encounter Active Problems:   Prolonged QT interval   Permanent atrial fibrillation ([name])   Insulin dependent type 2 diabetes mellitus ([name])   CKD (chronic kidney disease) stage 4, GFR 15-29 ml/min ([name])   CAD (coronary artery disease)   HFrEF (heart failure with reduced ejection fraction) ([name])   HTN (hypertension)   HLD (hyperlipidemia)   Gout   Sleep apnea   GERD    Assessment and Plan: * Foreign body ingestion, initial encounter 89 year old male who presented to ED after accidentally ingesting a soda can tab and feeling like it was lodged in his throat  -obs to tele -initial neck xray at 2AM showed foreign body in proximal cervical esophagus. Repeat around 9AM this was no longer visualized -denies any stridor/shortness of breath. Throat still feels sore to swallow -GI consulted with plans for EGD today -last took eliquis at 8AM on 08/17/23 -NPO  -will f/u on GI recs following EGD and likely home tomorrow   Prolonged QT interval Optimize electrolytes  Keep on telemetry Avoid qt prolonging drugs  Repeat ekg in AM    Permanent atrial fibrillation ([name]) Continue eliquis, lopressor  Insulin dependent type 2 diabetes mellitus ([name]) A1c of 6.9 in 03/2023 Continue jardiance On 20 units of long acting insulin. Last took 08/16/23. Sugars well controlled. Hold for now and continue SSI while NPO   CKD (chronic kidney disease) stage 4, GFR 15-29 ml/min ([name]) Followed by nephrology, Dr. Allena Katz Baseline  creatinine appears to be around 3.2-3.4 Stable, continue to monitor   CAD (coronary artery disease) Continue medical management  ASA, lopressor, imdur, crestor, ranexa   HFrEF (heart failure with reduced ejection fraction) ([name]) Euvolemic  Continue demadex, lopressor Daily weights/strict I/O   HTN (hypertension) Elevated, but without medication  IV lopressor PRN while NPO Resume home medical regimen after EGD if recommended Lopressor 50mg  BID, hydralazine 25mg  daily, torsemide 40mg  AM/20mg  PM  HLD (hyperlipidemia) Continue crestor 10mg  daily   Gout Continue allopurinol. Takes three times a week   Sleep apnea Cpap nightly, but doesn't use nightly Declines while in patient     Advance Care Planning:   Code Status: Limited: Do not attempt resuscitation (DNR) -DNR-LIMITED -Do Not Intubate/DNI    Consults: GI  DVT Prophylaxis: eliquis   Family Communication: none   Severity of Illness: The appropriate patient status for this patient is OBSERVATION. Observation status is judged to be reasonable and necessary in order to provide the required intensity of service to ensure the patient's safety. The patient's presenting symptoms, physical exam findings, and initial radiographic and laboratory data in the context of their medical condition is felt to place them at decreased risk for further clinical deterioration. Furthermore, it is anticipated that the patient will be medically stable for discharge from the hospital within 2 midnights of admission.   Author: Orland Mustard, MD 08/18/2023 11:47 AM  For on call review [email or web address].

## 2023-08-18 NOTE — H&P (View-Only) (Signed)
Attending physician's note   I have taken a history, reviewed the chart, and examined the patient. I performed a substantive portion of this encounter, including complete performance of at least one of the key components, in conjunction with the APP. I agree with the APP's note, impression, and recommendations with my edits.   89 year old male with medical history as outlined below, to include history of A-fib (on Eliquis), CKD, presents to the ER after swallowing a soda can tab.  This was initially visualized on soft tissue neck x-ray.  He thinks the tab has since changed position, but still pointing at his suprasternal notch.  Interestingly follow-up CXR and abdominal imaging does not show any metallic object.  We discussed the possibility that the tab has in fact passed from the esophagus into the stomach, but odd that we cannot see it on imaging.  Given his anticoagulation, sharp edges to the tab, I recommend we do urgent upper endoscopy now to try to retrieve this.  We discussed the risks, benefits, alternatives of upper endoscopy, to include observation, and he wishes to proceed with upper endoscopy as well.   Ann Held, [place] [phone] office                                                     Consultation Note   Referring Provider:  [facility] PCP: Joaquim Nam, MD Primary Gastroenterologist: Stan Head, MD        Reason for Consultation: ingestion of foreign body  DOA: 08/17/2023         Hospital Day: 2   ASSESSMENT    Brief Narrative:  89 y.o. year old male with a history of DM2, HTN, HLD, CKD, AFIB, diverticular bleeds, colon polyps. See PMH for additional history   Accidental foreign body ingestion ( soda can top / tab).  Metal tab seen in cervical esophagus on initial soft tissue neck xray. Not visualized on follow up neck xray 6 hours later but interesting it also wasn't seen on CXR or abdominal xray.  No significant chest pain / no  respiratory distress  AFIB, on Eliquis Last dose was 08/17/23 am  Thrombocytopenia, chronic Platelets stable at 131  Chronic macrocytic anemia, stable  Chronic constipation managed with Miralax    PLAN:   Patient is anticoagulated. The metal tab most likely has sharp edges. Probably best to proceed with EGD to look for and remove the metal tab. I am not currently with the patient but Dr. Santa Lighter will explain the risks and benefits of the procedure when he sees him in Endo shortly .   HPI   [name] accidentally swallowed the top / tab of a soda can last night. He could feel in lodged in back of throat. Came to ED, soft tissue xray showed the metal object in proximal esophagus. He late felt like the tab moved downward some. Follow up soft tissue neck xray 6 hours later did not show the object though it also wasn't seen on abdominal film or xray and he hasn't passed it with a BM either. He is having some throat discomfort, lidocaine helps. No respiratory distress.   ED / Admission workup notable for :   WBC 6.6 Hgb 11.5, MCV 101 Platelets 131  Soft tissue neck xray 10/4 around 2 am Radiopaque foreign body  is noted within the proximal cervical esophagus which corresponds to the history of ingestion soda tab.  Follow up soft tissue neck xray and also a CXR and abdominal films around 8 am didn't show the foreign body.     Labs and Imaging: Recent Labs    08/18/23 0551  WBC 6.6  HGB 11.5*  HCT 36.3*  PLT 131*   Recent Labs    08/18/23 0551  NA 138  K 3.8  CL 100  CO2 27  GLUCOSE 106*  BUN 41*  CREATININE 3.00*  CALCIUM 9.1   No results for input(s): "PROT", "ALBUMIN", "AST", "ALT", "ALKPHOS", "BILITOT", "BILIDIR", "IBILI" in the last 72 hours. No results for input(s): "HEPBSAG", "HCVAB", "HEPAIGM", "HEPBIGM" in the last 72 hours. No results for input(s): "LABPROT", "INR" in the last 72 hours.    Past Medical History:  Diagnosis Date   Age-related macular  degeneration, wet, both eyes ([name])    Anemia    Secondary to acute blood loss   Anginal pain ([name])    last chest pain in Feb 2021   Arthritis    "mild in hands, knees, ankles" (11/13/2015)   Atrial fibrillation ([name])    Consideration was given for atrial flutter ablation, but patient developed atrial fibrillation. Cardioversion was done. Dr. Graciela Husbands decided to watch him clinically. November, 2011   Atrial flutter [facility]) 07/2010   September, 2011   Hospital with PNA and cath done.Marland KitchenMarland KitchenCoumadin.  Atrial flutter ablation planned, but  pt. then had atrial fibrillation,/outpatient conversion 09/08/10..NSR..plan to follow..Dr. Graciela Husbands   CAD (coronary artery disease)    Catheterization, September, 2011,  grafts patent from redo CABG,, medical therapy of coronary disease, consideration to proceeding with atrial flutter ablation   Carotid artery disease ([name])    Doppler 09/18/2009 - 49% bilateral stenoses   Carotid artery disease ([name])    49% bilateral, Doppler, November, 2010   CHF (congestive heart failure) ([name])    Chronic kidney disease (CKD), stage III (moderate) ([name])    Diabetic peripheral neuropathy ([name])    feet   Diverticulosis of colon with hemorrhage 2009   several unit diverticular bleed    Erectile dysfunction    Mild   Gout    Hearing loss    wears hearing aids   History of blood transfusion "several"   related to diverticular bleeding   Hyperlipidemia    Hypertension    Mitral regurgitation    Mild, echo, September, 2011   NSTEMI (non-ST elevated myocardial infarction) ([name]) 07/2010   at [facility] with repeat cath, rec medical mgmt    OSA on CPAP    uses CPAP nightly   Personal history of colonic polyps    PNA (pneumonia) 9/11   NSTEMI at [facility] with repeat cath, rec medical mgmt    RBBB (right bundle branch block)    S/P mitral valve repair 02/06/2020   s/p TEER with a single MitraClip NTW placed on A2/P2   Shoulder pain    "positional; better now" (11/13/2015)   Skin cancer    R  lower leg, per derm 2012   Type II diabetes mellitus ([name])     Past Surgical History:  Procedure Laterality Date   CARDIAC CATHETERIZATION  2006   Nuclear..slight lateral ischemia..medical therapy   CARDIAC CATHETERIZATION  08/04/2010   grafts patent from redo CABG...medical Rx and ablate Atrial flutter (LV not injected)    CARDIOVERSION  ~ 2010   CAROTID ENDARTERECTOMY Right 1994   CATARACT EXTRACTION W/ INTRAOCULAR LENS  IMPLANT, BILATERAL Bilateral    COLON RESECTION N/A 01/13/2016   Procedure: EXPLORATORY LAPAROTOMY, LEFT AND SIGMOID COLON REMOVAL;  Surgeon: Axel Filler, MD;  Location: [facility] OR;  Service: General;  Laterality: N/A;  Extended open left hemicolectomy and sigmoidectomy    COLONOSCOPY N/A 11/14/2015   Procedure: COLONOSCOPY;  Surgeon: Ruffin Frederick, MD;  Location: [facility] ENDOSCOPY;  Service: Gastroenterology;  Laterality: N/A;   COLONOSCOPY Left 01/05/2016   Procedure: COLONOSCOPY;  Surgeon: Ruffin Frederick, MD;  Location: [facility] ENDOSCOPY;  Service: Gastroenterology;  Laterality: Left;  no sedation to start, moderate if needed   COLONOSCOPY W/ POLYPECTOMY     CORONARY ARTERY BYPASS GRAFT  1995; 2006   "X 3; X3"   CORONARY ARTERY BYPASS GRAFT  1995, 2006   DOPPLER ECHOCARDIOGRAPHY  08/2008   EF 60%   DOPPLER ECHOCARDIOGRAPHY  08/02/2010   65-70%   DOPPLER ECHOCARDIOGRAPHY  07/2010   MR mild   ESOPHAGOGASTRODUODENOSCOPY N/A 06/19/2014   Procedure: ESOPHAGOGASTRODUODENOSCOPY (EGD);  Surgeon: Beverley Fiedler, MD;  Location[facility] ENDOSCOPY;  Service: Endoscopy;  Laterality: N/A;   ESOPHAGOGASTRODUODENOSCOPY N/A 11/14/2015   Procedure: ESOPHAGOGASTRODUODENOSCOPY (EGD);  Surgeon: Ruffin Frederick, MD;  Location: [facility] ENDOSCOPY;  Service: Gastroenterology;  Laterality: N/A;   FLEXIBLE SIGMOIDOSCOPY N/A 11/16/2015   Procedure: FLEXIBLE SIGMOIDOSCOPY;  Surgeon: Ruffin Frederick, MD;  Location: [facility] ENDOSCOPY;  Service: Gastroenterology;  Laterality: N/A;   LAPAROSCOPIC  CHOLECYSTECTOMY  2008   LEFT HEART CATH N/A 06/14/2014   Procedure: LEFT HEART CATH;  Surgeon: Lesleigh Noe, MD;  Location: [facility] CATH LAB;  Service: Cardiovascular;  Laterality: N/A;   LEFT HEART CATH AND CORONARY ANGIOGRAPHY N/A 08/02/2018   Procedure: LEFT HEART CATH AND CORONARY ANGIOGRAPHY;  Surgeon: Runell Gess, MD;  Location: [facility] INVASIVE CV LAB;  Service: Cardiovascular;  Laterality: N/A;   LEFT HEART CATHETERIZATION WITH CORONARY/GRAFT ANGIOGRAM N/A 06/11/2014   Procedure: LEFT HEART CATHETERIZATION WITH Isabel Caprice;  Surgeon: Lesleigh Noe, MD;  Location: [facility] CATH LAB;  Service: Cardiovascular;  Laterality: N/A;   MITRAL VALVE REPAIR N/A 02/06/2020   Procedure: MITRAL VALVE REPAIR;  Surgeon: Tonny Bollman, MD;  Location: [facility] INVASIVE CV LAB;  Service: Cardiovascular;  Laterality: N/A;   PERCUTANEOUS CORONARY STENT INTERVENTION (PCI-S) N/A 06/13/2014   Procedure: PERCUTANEOUS CORONARY STENT INTERVENTION (PCI-S);  Surgeon: Lesleigh Noe, MD;  Location: [facility] CATH LAB;  Service: Cardiovascular;  Laterality: N/A;   RIGHT HEART CATH N/A 01/06/2020   Procedure: RIGHT HEART CATH;  Surgeon: Dolores Patty, MD;  Location: [facility] INVASIVE CV LAB;  Service: Cardiovascular;  Laterality: N/A;   SKIN CANCER EXCISION Left 10/2015   calf   SKIN CANCER EXCISION Right 2014?   chest   TEE WITHOUT CARDIOVERSION N/A 01/06/2020   Procedure: TRANSESOPHAGEAL ECHOCARDIOGRAM (TEE);  Surgeon: Dolores Patty, MD;  Location: [facility] ENDOSCOPY;  Service: Cardiovascular;  Laterality: N/A;   TONSILLECTOMY     VASECTOMY      Family History  Problem Relation Age of Onset   Kidney disease Mother        Kidney failure   Stroke Mother    Diabetes Mother    Heart disease Father        MI   Arthritis Sister    Cancer Sister        Throat   Heart disease Sister        MI   Diabetes Sister    Prostate cancer Neg Hx    Colon cancer  Neg Hx     Prior to Admission medications   Medication  Sig Start Date End Date Taking? Authorizing Provider  allopurinol (ZYLOPRIM) 100 MG tablet TAKE ONE-HALF (1/2) TABLET EVERY MONDAY, [facility] AND FRIDAY 01/13/23  Yes Joaquim Nam, MD  amoxicillin (AMOXIL) 500 MG tablet Take by mouth. Patient takes 4 tablets prior to going to the dentist 12/28/21  Yes [provider]  aspirin 81 MG tablet Take 81 mg by mouth daily.   Yes [provider]  Cholecalciferol (VITAMIN D) 1000 UNITS capsule Take 1,000 Units by mouth daily.    Yes [provider]  diclofenac Sodium (VOLTAREN) 1 % GEL Apply 2 g topically 4 (four) times daily. 07/20/20  Yes Bast, Traci A, NP  ELIQUIS 2.5 MG TABS tablet TAKE 1 TABLET TWICE A DAY 12/08/22  Yes Runell Gess, MD  empagliflozin (JARDIANCE) 10 MG TABS tablet TAKE 1 TABLET DAILY BEFORE BREAKFAST (PLEASE CALL THE OFFICE TO ARRANGE YOUR FOLLOW UP APPOINTMENT) 07/03/23  Yes Bensimhon, Bevelyn Buckles, MD  fish oil-omega-3 fatty acids 1000 MG capsule Take 1 g by mouth daily.   Yes [provider]  hydrALAZINE (APRESOLINE) 25 MG tablet Take 1 tablet (25 mg total) by mouth daily. 12/02/22  Yes Runell Gess, MD  ipratropium (ATROVENT) 0.03 % nasal spray USE 2 SPRAYS IN EACH NOSTRIL TWICE A DAY AS NEEDED FOR RHINITIS 03/06/23  Yes Joaquim Nam, MD  isosorbide mononitrate (IMDUR) 60 MG 24 hr tablet Take 1 tablet (60 mg total) by mouth daily. 03/01/23  Yes Weaver, Scott T, PA-C  LANTUS 100 UNIT/ML injection INJECT 20 TO 25 UNITS AT BEDTIME 08/15/22  Yes Joaquim Nam, MD  magnesium oxide (MAG-OX) 400 MG tablet Take 1 tablet (400 mg total) by mouth every other day. 04/13/23  Yes Joaquim Nam, MD  metoprolol tartrate (LOPRESSOR) 50 MG tablet TAKE 1 TABLET(50 MG) BY MOUTH TWICE DAILY 02/22/23  Yes Runell Gess, MD  Multiple Vitamin (MULTIVITAMIN WITH MINERALS) TABS Take 1 tablet by mouth daily.   Yes [provider]  nitroGLYCERIN (NITROSTAT) 0.4 MG SL tablet DISSOLVE 1 TABLET UNDER THE  TONGUE EVERY 5 MINUTES AS NEEDED FOR CHEST PAIN FOR A MAXIMUM OF 3 DOSES 07/01/22  Yes Runell Gess, MD  polyethylene glycol (MIRALAX / GLYCOLAX) 17 g packet Take 17 g by mouth daily.   Yes [provider]  Potassium Gluconate 595 MG CAPS Take 595 mg by mouth daily.   Yes [provider]  ranolazine (RANEXA) 500 MG 12 hr tablet TAKE 1 TABLET TWICE A DAY 03/08/23  Yes Runell Gess, MD  rosuvastatin (CRESTOR) 10 MG tablet Take 1 tablet (10 mg total) by mouth daily. 03/01/23  Yes Weaver, Scott T, PA-C  torsemide (DEMADEX) 20 MG tablet Take 2 tablets (40 mg total) every morning and 1 tablet (20 mg total) every evening. 03/16/23  Yes Weaver, Scott T, PA-C  valACYclovir (VALTREX) 1000 MG tablet Take by mouth. Used as needed for a cold sore. 08/30/22  Yes [provider]  BD INSULIN SYRINGE ULTRAFINE 31G X 5/16" 0.3 ML MISC USE DAILY AS INSTRUCTED 12/02/15   Joaquim Nam, MD  glucose blood (FREESTYLE LITE) test strip USE TO TEST BLOOD SUGAR ONCE DAILY AND AS NEEDED. Dx E11.9 08/21/22   Joaquim Nam, MD    No current facility-administered medications for this encounter.   Current Outpatient Medications  Medication Sig Dispense Refill   allopurinol (ZYLOPRIM) 100 MG tablet TAKE  ONE-HALF (1/2) TABLET EVERY MONDAY, WEDNESDAY AND FRIDAY 30 tablet 3   amoxicillin (AMOXIL) 500 MG tablet Take by mouth. Patient takes 4 tablets prior to going to the dentist     aspirin 81 MG tablet Take 81 mg by mouth daily.     Cholecalciferol (VITAMIN D) 1000 UNITS capsule Take 1,000 Units by mouth daily.      diclofenac Sodium (VOLTAREN) 1 % GEL Apply 2 g topically 4 (four) times daily. 50 g 0   ELIQUIS 2.5 MG TABS tablet TAKE 1 TABLET TWICE A DAY 180 tablet 3   empagliflozin (JARDIANCE) 10 MG TABS tablet TAKE 1 TABLET DAILY BEFORE BREAKFAST (PLEASE CALL THE OFFICE TO ARRANGE YOUR FOLLOW UP APPOINTMENT) 90 tablet 0   fish oil-omega-3 fatty acids 1000 MG capsule Take 1 g by mouth daily.      hydrALAZINE (APRESOLINE) 25 MG tablet Take 1 tablet (25 mg total) by mouth daily. 90 tablet 3   ipratropium (ATROVENT) 0.03 % nasal spray USE 2 SPRAYS IN EACH NOSTRIL TWICE A DAY AS NEEDED FOR RHINITIS 90 mL 3   isosorbide mononitrate (IMDUR) 60 MG 24 hr tablet Take 1 tablet (60 mg total) by mouth daily. 90 tablet 3   LANTUS 100 UNIT/ML injection INJECT 20 TO 25 UNITS AT BEDTIME 40 mL 3   magnesium oxide (MAG-OX) 400 MG tablet Take 1 tablet (400 mg total) by mouth every other day.     metoprolol tartrate (LOPRESSOR) 50 MG tablet TAKE 1 TABLET(50 MG) BY MOUTH TWICE DAILY 180 tablet 3   Multiple Vitamin (MULTIVITAMIN WITH MINERALS) TABS Take 1 tablet by mouth daily.     nitroGLYCERIN (NITROSTAT) 0.4 MG SL tablet DISSOLVE 1 TABLET UNDER THE TONGUE EVERY 5 MINUTES AS NEEDED FOR CHEST PAIN FOR A MAXIMUM OF 3 DOSES 25 tablet 11   polyethylene glycol (MIRALAX / GLYCOLAX) 17 g packet Take 17 g by mouth daily.     Potassium Gluconate 595 MG CAPS Take 595 mg by mouth daily.     ranolazine (RANEXA) 500 MG 12 hr tablet TAKE 1 TABLET TWICE A DAY 180 tablet 3   rosuvastatin (CRESTOR) 10 MG tablet Take 1 tablet (10 mg total) by mouth daily. 90 tablet 3   torsemide (DEMADEX) 20 MG tablet Take 2 tablets (40 mg total) every morning and 1 tablet (20 mg total) every evening. 270 tablet 3   valACYclovir (VALTREX) 1000 MG tablet Take by mouth. Used as needed for a cold sore.     BD INSULIN SYRINGE ULTRAFINE 31G X 5/16" 0.3 ML MISC USE DAILY AS INSTRUCTED 100 each 2   glucose blood (FREESTYLE LITE) test strip USE TO TEST BLOOD SUGAR ONCE DAILY AND AS NEEDED. Dx E11.9 300 each 3    Allergies as of 08/17/2023   (No Known Allergies)    Social History   Socioeconomic History   Marital status: Widowed    Spouse name: Not on file   Number of children: 2   Years of education: Not on file   Highest education level: Not on file  Occupational History   Occupation: Retired Surveyor, minerals. Rep. Equities trader:  RETIRED  Tobacco Use   Smoking status: Former    Current packs/day: 0.00    Average packs/day: 1 pack/day for 8.0 years (8.0 ttl pk-yrs)    Types: Cigarettes    Start date: 69    Quit date: 1958    Years since quitting: 66.8   Smokeless tobacco: Never  Tobacco comments:    "quit smoking cigarettes in 1958"  Vaping Use   Vaping status: Never Used  Substance and Sexual Activity   Alcohol use: No    Alcohol/week: 0.0 standard drinks of alcohol   Drug use: No   Sexual activity: Yes  Other Topics Concern   Not on file  Social History Narrative   From [place].  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     Lives with girlfriend Jani Gravel.  Widowed 12/2005.   Social Determinants of Health   Financial Resource Strain: Low Risk  (11/01/2022)   Overall Financial Resource Strain (CARDIA)    Difficulty of Paying Living Expenses: Not hard at all  Food Insecurity: No Food Insecurity (12/09/2022)   Hunger Vital Sign    Worried About Running Out of Food in the Last Year: Never true    Ran Out of Food in the Last Year: Never true  Transportation Needs: No Transportation Needs (12/09/2022)   PRAPARE - Administrator, Civil Service (Medical): No    Lack of Transportation (Non-Medical): No  Physical Activity: Inactive (11/01/2022)   Exercise Vital Sign    Days of Exercise per Week: 0 days    Minutes of Exercise per Session: 0 min  Stress: No Stress Concern Present (11/01/2022)   [employer] of Occupational Health - Occupational Stress Questionnaire    Feeling of Stress : Not at all  Social Connections: Moderately Isolated (11/01/2022)   Social Connection and Isolation Panel [NHANES]    Frequency of Communication with Friends and Family: More than three times a week    Frequency of Social Gatherings with Friends and Family: More than three times a week    Attends Religious Services: Never    Database administrator or Organizations: Yes     Attends Banker Meetings: 1 to 4 times per year    Marital Status: Widowed  Intimate Partner Violence: Not At Risk (11/01/2022)   Humiliation, Afraid, Rape, and Kick questionnaire    Fear of Current or Ex-Partner: No    Emotionally Abused: No    Physically Abused: No    Sexually Abused: No     Code Status   Code Status: Prior  Review of Systems: All systems reviewed and negative except where noted in HPI.  Physical Exam: Vital signs in last 24 hours: Temp:  [97.8 F (36.6 C)-98.3 F (36.8 C)] 97.8 F (36.6 C) (10/04 0537) Pulse Rate:  [40-68] 55 (10/04 0700) Resp:  [10-21] 16 (10/04 0700) BP: (150-178)/(61-88) 157/68 (10/04 0700) SpO2:  [97 %-100 %] 97 % (10/04 0700)    General:  Pleasant male in NAD. Daughter and Son in room Psych:  Cooperative. Normal mood and affect Eyes: Pupils equal Ears:  Normal auditory acuity Nose: No deformity, discharge or lesions Neck:  Supple, no masses felt Lungs:  Clear to auscultation.  Heart:  Regular rate, regular rhythm.  Abdomen:  Soft, nondistended, nontender, active bowel sounds, no masses felt Rectal :  Deferred Msk: Symmetrical without gross deformities.  Neurologic:  Alert, oriented, grossly normal neurologically Extremities : No edema Skin:  Intact without significant lesions.    Intake/Output from previous day: No intake/output data recorded. Intake/Output this shift:  No intake/output data recorded.  Active Problems:   * No active hospital problems. Willette Cluster, NP-C   08/18/2023, 9:47 AM

## 2023-08-18 NOTE — Transfer of Care (Signed)
Immediate Anesthesia Transfer of Care Note  Patient: [name]  Procedure(s) Performed: ESOPHAGOGASTRODUODENOSCOPY (EGD) WITH PROPOFOL  Patient Location: PACU  Anesthesia Type:General  Level of Consciousness: drowsy, patient cooperative, and responds to stimulation  Airway & Oxygen Therapy: Patient Spontanous Breathing  Post-op Assessment: Report given to RN, Post -op Vital signs reviewed and stable, and Patient moving all extremities X 4  Post vital signs: Reviewed and stable  Last Vitals:  Vitals Value Taken Time  BP    Temp    Pulse 97 08/18/23 1235  Resp 17 08/18/23 1235  SpO2 95 % 08/18/23 1235  Vitals shown include unfiled device data.  Last Pain:  Vitals:   08/18/23 1043  TempSrc: Temporal  PainSc: 0-No pain         Complications: No notable events documented.

## 2023-08-18 NOTE — Op Note (Signed)
[facility] Patient Name: [name] Procedure Date : 08/18/2023 MRN: [id number] Attending MD: Doristine Locks , MD, [id number] Date of Birth: 11-Nov-1932 CSN: [id number] Age: 89 Admit Type: Inpatient Procedure:                Upper GI endoscopy Indications:              Foreign body in the GI tract Providers:                Doristine Locks, MD, Jacquelyn "Jaci" Clelia Croft, RN,                            Stephens Shire RN, RN, Marja Kays, Technician Referring MD:              Medicines:                Monitored Anesthesia Care Complications:            No immediate complications. Estimated Blood Loss:     Estimated blood loss: none. Procedure:                Pre-Anesthesia Assessment:                           - Prior to the procedure, a History and Physical                            was performed, and patient medications and                            allergies were reviewed. The patient's tolerance of                            previous anesthesia was also reviewed. The risks                            and benefits of the procedure and the sedation                            options and risks were discussed with the patient.                            All questions were answered, and informed consent                            was obtained. Prior Anticoagulants: The patient has                            taken Eliquis (apixaban), last dose was 1 day prior                            to procedure. ASA Grade Assessment: IV - A patient                            with severe systemic disease that is a constant  threat to life. After reviewing the risks and                            benefits, the patient was deemed in satisfactory                            condition to undergo the procedure.                           After obtaining informed consent, the endoscope was                            passed under direct vision. Throughout the                             procedure, the patient's blood pressure, pulse, and                            oxygen saturations were monitored continuously. The                            GIF-H190 ([id number]) Olympus endoscope was introduced                            through the mouth, and advanced to the third part                            of duodenum. The upper GI endoscopy was                            accomplished without difficulty. The patient                            tolerated the procedure well. 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      The Z-line was regular and was found 40 cm from the incisors.      The examined esophagus was otherwise normal. No foreign body, mucosal       trauma noted. Made several passes through the esophagus and UES.      Diffuse mild inflammation characterized by congestion (edema) and       erythema was found in the gastric fundus and in the gastric body. This       was not biopsied due to active anticoagulation and not wanting to       confound the clinical picture with the possibility of a post biopsy       bleed.      The incisura, gastric antrum and pylorus were normal.      Few non-bleeding superficial duodenal ulcers with no stigmata of       bleeding were found in the duodenal bulb and in the first portion of the       duodenum. The largest lesion was 4 mm in largest dimension. This was not       biopsied due  to active anticoagulation and not wanting to confound the       clinical picture with the possibility of a post biopsy bleed.      The second portion of the duodenum and third portion of the duodenum       were normal. Impression:               - Ectopic gastric mucosa in the upper third of the                            esophagus.                           - Z-line regular, 40 cm from the incisors.                           - Normal esophagus.                            - Gastritis.                           - Normal incisura, antrum and pylorus.                           - Non-bleeding duodenal ulcers with no stigmata of                            bleeding.                           - Normal second portion of the duodenum and third                            portion of the duodenum.                           - No specimens collected.                           - Made several passes through the esophagus,                            stomach, duodenum and no foreign object identified. Recommendation:           - Return patient to hospital ward for continued                            overnight observation.                           - Full liquid diet today.                           - Use Protonix (pantoprazole) 40 mg PO BID for 6                            weeks to promote mucosal healing, then  reduce to 40                            mg daily, and can continue low dose PPI for ongoing                            gastric prophylaxis.                           - Perform an H. pylori serology today.                           - Miralax 1 cap BID to facilitate soft stools.                           - Repeat 2-view abdominal X-ray in the AM.                           - Hold Eliquis today and tomorrow. Procedure Code(s):        --- Professional ---                           [phone], Esophagogastroduodenoscopy, flexible,                            transoral; diagnostic, including collection of                            specimen(s) by brushing or washing, when performed                            (separate procedure) Diagnosis Code(s):        --- Professional ---                           Q40.2, Other specified congenital malformations of                            stomach                           K29.70, Gastritis, unspecified, without bleeding                           K26.9, Duodenal ulcer, unspecified as acute or                            chronic,  without hemorrhage or perforation                           T18.9XXA, Foreign body of alimentary tract, part                            unspecified, initial encounter CPT copyright 2022 [employer]. All rights reserved. The codes documented in this report are preliminary and upon coder review may  be revised to meet current compliance  requirements. Doristine Locks, MD 08/18/2023 12:33:49 PM Number of Addenda: 0

## 2023-08-18 NOTE — Interval H&P Note (Signed)
History and Physical Interval Note:  08/18/2023 11:18 AM  [name]  has presented today for surgery, with the diagnosis of foreign body ingestion.  The various methods of treatment have been discussed with the patient and family. After consideration of risks, benefits and other options for treatment, the patient has consented to  Procedure(s): ESOPHAGOGASTRODUODENOSCOPY (EGD) WITH PROPOFOL (N/A) as a surgical intervention.  Plan for general anesthesia and elective intubation per Anesthesia team.  The patient's history has been reviewed, patient examined, no change in status, stable for surgery.  I have reviewed the patient's chart and labs.  Questions were answered to the patient's satisfaction.     Verlin Dike Linden Tagliaferro

## 2023-08-18 NOTE — Anesthesia Procedure Notes (Addendum)
Procedure Name: Intubation Date/Time: 08/18/2023 12:08 PM  Performed by: Sharyn Dross, CRNAPre-anesthesia Checklist: Patient identified, Emergency Drugs available, Suction available and Patient being monitored Patient Re-evaluated:Patient Re-evaluated prior to induction Oxygen Delivery Method: Circle system utilized Preoxygenation: Pre-oxygenation with 100% oxygen Induction Type: IV induction Ventilation: Mask ventilation without difficulty Laryngoscope Size: Mac and 4 Grade View: Grade I Tube type: Oral Tube size: 7.5 mm Number of attempts: 1 Airway Equipment and Method: Stylet and Oral airway Placement Confirmation: ETT inserted through vocal cords under direct vision, positive ETCO2 and breath sounds checked- equal and bilateral Secured at: 21 cm Tube secured with: Tape Dental Injury: Teeth and Oropharynx as per pre-operative assessment

## 2023-08-18 NOTE — ED Notes (Signed)
Patient ambulated to bathroom with no assistance needed.

## 2023-08-18 NOTE — ED Provider Notes (Signed)
Patient signed out to me at 0700 by Dr. Clayborne Dana pending ENT vs GI for foreign body removal. Patient swallowed a soda can tab last evening, initially felt like it was in his throat. Initial neck XR showed the foreign body in his proximal esophagus. Night team discussed with GI and due to the proximal nature of the foreign body, recommended ENT for removal. ENT reportedly did not have the equipment needed here to remove the foreign body and recommended transfer to [facility]. On my evaluation, the patient now reports that he feels as though the foreign body has moved and no longer feels discomfort in his throat. He states he has no pain and feels hungry, no shortness of breath. Lungs clear bilaterally, no stridor, abdomen is soft and non-tender. Will repeat XR to determine current position of foreign body for removal.   Clinical Course as of 08/18/23 0930  Fri Aug 18, 2023  [id number] No obvious foreign body visualized on XR. Patient reports feels intermittent mid chest pain and may have moved down the esophagus. I spoke Dr. Barron Alvine of GI who recommended admission for obs but to keep NPO as they may still scope today. He will be down to see the patient. [VK]    Clinical Course User Index [VK] Rexford Maus, DO      Rexford Maus, DO 08/18/23 0930

## 2023-08-18 NOTE — Assessment & Plan Note (Signed)
Elevated, but without medication  IV lopressor PRN while NPO Resume home medical regimen after EGD if recommended Lopressor 50mg  BID, hydralazine 25mg  daily, torsemide 40mg  AM/20mg  PM

## 2023-08-18 NOTE — Assessment & Plan Note (Signed)
Continue allopurinol. Takes three times a week

## 2023-08-18 NOTE — Assessment & Plan Note (Signed)
Continue crestor '10mg'$  daily

## 2023-08-18 NOTE — Assessment & Plan Note (Signed)
89 year old male who presented to ED after accidentally ingesting a soda can tab and feeling like it was lodged in his throat  -obs to tele -initial neck xray at 2AM showed foreign body in proximal cervical esophagus. Repeat around 9AM this was no longer visualized -denies any stridor/shortness of breath. Throat still feels sore to swallow -GI consulted with plans for EGD today -last took eliquis at 8AM on 08/17/23 -NPO  -will f/u on GI recs following EGD and likely home tomorrow

## 2023-08-18 NOTE — Plan of Care (Signed)

## 2023-08-18 NOTE — Consult Note (Addendum)
Attending physician's note   I have taken a history, reviewed the chart, and examined the patient. I performed a substantive portion of this encounter, including complete performance of at least one of the key components, in conjunction with the APP. I agree with the APP's note, impression, and recommendations with my edits.   89 year old male with medical history as outlined below, to include history of A-fib (on Eliquis), CKD, presents to the ER after swallowing a soda can tab.  This was initially visualized on soft tissue neck x-ray.  He thinks the tab has since changed position, but still pointing at his suprasternal notch.  Interestingly follow-up CXR and abdominal imaging does not show any metallic object.  We discussed the possibility that the tab has in fact passed from the esophagus into the stomach, but odd that we cannot see it on imaging.  Given his anticoagulation, sharp edges to the tab, I recommend we do urgent upper endoscopy now to try to retrieve this.  We discussed the risks, benefits, alternatives of upper endoscopy, to include observation, and he wishes to proceed with upper endoscopy as well.   [name], [name] [phone] office                                                     Consultation Note   Referring Provider:  Triad [name] PCP: Jeremiah Nam, MD Primary Gastroenterologist: Jeremiah Head, MD        Reason for Consultation: ingestion of foreign body  DOA: 08/17/2023         Hospital Day: 2   ASSESSMENT    Brief Narrative:  89 y.o. year old male with a history of DM2, HTN, HLD, CKD, AFIB, diverticular bleeds, colon polyps. See PMH for additional history   Accidental foreign body ingestion ( soda can top / tab).  Metal tab seen in cervical esophagus on initial soft tissue neck xray. Not visualized on follow up neck xray 6 hours later but interesting it also wasn't seen on CXR or abdominal xray.  No significant chest pain / no  respiratory distress  AFIB, on Eliquis Last dose was 08/17/23 am  Thrombocytopenia, chronic Platelets stable at 131  Chronic macrocytic anemia, stable  Chronic constipation managed with Miralax    PLAN:   Patient is anticoagulated. The metal tab most likely has sharp edges. Probably best to proceed with EGD to look for and remove the metal tab. I am not currently with the patient but [name] will explain the risks and benefits of the procedure when he sees him in Endo shortly .   HPI   [name] accidentally swallowed the top / tab of a soda can last night. He could feel in lodged in back of throat. Came to ED, soft tissue xray showed the metal object in proximal esophagus. He late felt like the tab moved downward some. Follow up soft tissue neck xray 6 hours later did not show the object though it also wasn't seen on abdominal film or xray and he hasn't passed it with a BM either. He is having some throat discomfort, lidocaine helps. No respiratory distress.   ED / Admission workup notable for :   WBC 6.6 Hgb 11.5, MCV 101 Platelets 131  Soft tissue neck xray 10/4 around 2 am Radiopaque foreign body  is noted within the proximal cervical esophagus which corresponds to the history of ingestion soda tab.  Follow up soft tissue neck xray and also a CXR and abdominal films around 8 am didn't show the foreign body.     Labs and Imaging: Recent Labs    08/18/23 0551  WBC 6.6  HGB 11.5*  HCT 36.3*  PLT 131*   Recent Labs    08/18/23 0551  NA 138  K 3.8  CL 100  CO2 27  GLUCOSE 106*  BUN 41*  CREATININE 3.00*  CALCIUM 9.1   No results for input(s): "PROT", "ALBUMIN", "AST", "ALT", "ALKPHOS", "BILITOT", "BILIDIR", "IBILI" in the last 72 hours. No results for input(s): "HEPBSAG", "HCVAB", "HEPAIGM", "HEPBIGM" in the last 72 hours. No results for input(s): "LABPROT", "INR" in the last 72 hours.    Past Medical History:  Diagnosis Date   Age-related macular  degeneration, wet, both eyes ([name])    Anemia    Secondary to acute blood loss   Anginal pain ([name])    last chest pain in Feb 2021   Arthritis    "mild in hands, knees, ankles" (11/13/2015)   Atrial fibrillation ([name])    Consideration was given for atrial flutter ablation, but patient developed atrial fibrillation. Cardioversion was done. [name] decided to watch him clinically. November, 2011   Atrial flutter [facility]) 07/2010   September, 2011   Hospital with PNA and cath done.Marland KitchenMarland KitchenCoumadin.  Atrial flutter ablation planned, but  pt. then had atrial fibrillation,/outpatient conversion 09/08/10..NSR..plan to follow..[name]   CAD (coronary artery disease)    Catheterization, September, 2011,  grafts patent from redo CABG,, medical therapy of coronary disease, consideration to proceeding with atrial flutter ablation   Carotid artery disease ([name])    Doppler 09/18/2009 - 49% bilateral stenoses   Carotid artery disease ([name])    49% bilateral, Doppler, November, 2010   CHF (congestive heart failure) ([name])    Chronic kidney disease (CKD), stage III (moderate) ([name])    Diabetic peripheral neuropathy ([name])    feet   Diverticulosis of colon with hemorrhage 2009   several unit diverticular bleed    Erectile dysfunction    Mild   Gout    Hearing loss    wears hearing aids   History of blood transfusion "several"   related to diverticular bleeding   Hyperlipidemia    Hypertension    Mitral regurgitation    Mild, echo, September, 2011   NSTEMI (non-ST elevated myocardial infarction) ([name]) 07/2010   at [facility] with repeat cath, rec medical mgmt    OSA on CPAP    uses CPAP nightly   Personal history of colonic polyps    PNA (pneumonia) 9/11   NSTEMI at [facility] with repeat cath, rec medical mgmt    RBBB (right bundle branch block)    S/P mitral valve repair 02/06/2020   s/p TEER with a single MitraClip NTW placed on A2/P2   Shoulder pain    "positional; better now" (11/13/2015)   Skin cancer    R  lower leg, per derm 2012   Type II diabetes mellitus ([name])     Past Surgical History:  Procedure Laterality Date   CARDIAC CATHETERIZATION  2006   Nuclear..slight lateral ischemia..medical therapy   CARDIAC CATHETERIZATION  08/04/2010   grafts patent from redo CABG...medical Rx and ablate Atrial flutter (LV not injected)    CARDIOVERSION  ~ 2010   CAROTID ENDARTERECTOMY Right 1994   CATARACT EXTRACTION W/ INTRAOCULAR LENS  IMPLANT, BILATERAL Bilateral    COLON RESECTION N/A 01/13/2016   Procedure: EXPLORATORY LAPAROTOMY, LEFT AND SIGMOID COLON REMOVAL;  Surgeon: Axel Filler, MD;  Location: [facility] OR;  Service: General;  Laterality: N/A;  Extended open left hemicolectomy and sigmoidectomy    COLONOSCOPY N/A 11/14/2015   Procedure: COLONOSCOPY;  Surgeon: Ruffin Frederick, MD;  Location: [facility] ENDOSCOPY;  Service: Gastroenterology;  Laterality: N/A;   COLONOSCOPY Left 01/05/2016   Procedure: COLONOSCOPY;  Surgeon: Ruffin Frederick, MD;  Location: [facility] ENDOSCOPY;  Service: Gastroenterology;  Laterality: Left;  no sedation to start, moderate if needed   COLONOSCOPY W/ POLYPECTOMY     CORONARY ARTERY BYPASS GRAFT  1995; 2006   "X 3; X3"   CORONARY ARTERY BYPASS GRAFT  1995, 2006   DOPPLER ECHOCARDIOGRAPHY  08/2008   EF 60%   DOPPLER ECHOCARDIOGRAPHY  08/02/2010   65-70%   DOPPLER ECHOCARDIOGRAPHY  07/2010   MR mild   ESOPHAGOGASTRODUODENOSCOPY N/A 06/19/2014   Procedure: ESOPHAGOGASTRODUODENOSCOPY (EGD);  Surgeon: Beverley Fiedler, MD;  Location[facility] ENDOSCOPY;  Service: Endoscopy;  Laterality: N/A;   ESOPHAGOGASTRODUODENOSCOPY N/A 11/14/2015   Procedure: ESOPHAGOGASTRODUODENOSCOPY (EGD);  Surgeon: Ruffin Frederick, MD;  Location: [facility] ENDOSCOPY;  Service: Gastroenterology;  Laterality: N/A;   FLEXIBLE SIGMOIDOSCOPY N/A 11/16/2015   Procedure: FLEXIBLE SIGMOIDOSCOPY;  Surgeon: Ruffin Frederick, MD;  Location: [facility] ENDOSCOPY;  Service: Gastroenterology;  Laterality: N/A;   LAPAROSCOPIC  CHOLECYSTECTOMY  2008   LEFT HEART CATH N/A 06/14/2014   Procedure: LEFT HEART CATH;  Surgeon: Lesleigh Noe, MD;  Location: [facility] CATH LAB;  Service: Cardiovascular;  Laterality: N/A;   LEFT HEART CATH AND CORONARY ANGIOGRAPHY N/A 08/02/2018   Procedure: LEFT HEART CATH AND CORONARY ANGIOGRAPHY;  Surgeon: Runell Gess, MD;  Location: [facility] INVASIVE CV LAB;  Service: Cardiovascular;  Laterality: N/A;   LEFT HEART CATHETERIZATION WITH CORONARY/GRAFT ANGIOGRAM N/A 06/11/2014   Procedure: LEFT HEART CATHETERIZATION WITH Isabel Caprice;  Surgeon: Lesleigh Noe, MD;  Location: [facility] CATH LAB;  Service: Cardiovascular;  Laterality: N/A;   MITRAL VALVE REPAIR N/A 02/06/2020   Procedure: MITRAL VALVE REPAIR;  Surgeon: Tonny Bollman, MD;  Location: [facility] INVASIVE CV LAB;  Service: Cardiovascular;  Laterality: N/A;   PERCUTANEOUS CORONARY STENT INTERVENTION (PCI-S) N/A 06/13/2014   Procedure: PERCUTANEOUS CORONARY STENT INTERVENTION (PCI-S);  Surgeon: Lesleigh Noe, MD;  Location: [facility] CATH LAB;  Service: Cardiovascular;  Laterality: N/A;   RIGHT HEART CATH N/A 01/06/2020   Procedure: RIGHT HEART CATH;  Surgeon: Dolores Patty, MD;  Location: [facility] INVASIVE CV LAB;  Service: Cardiovascular;  Laterality: N/A;   SKIN CANCER EXCISION Left 10/2015   calf   SKIN CANCER EXCISION Right 2014?   chest   TEE WITHOUT CARDIOVERSION N/A 01/06/2020   Procedure: TRANSESOPHAGEAL ECHOCARDIOGRAM (TEE);  Surgeon: Dolores Patty, MD;  Location: [facility] ENDOSCOPY;  Service: Cardiovascular;  Laterality: N/A;   TONSILLECTOMY     VASECTOMY      Family History  Problem Relation Age of Onset   Kidney disease Mother        Kidney failure   Stroke Mother    Diabetes Mother    Heart disease Father        MI   Arthritis Sister    Cancer Sister        Throat   Heart disease Sister        MI   Diabetes Sister    Prostate cancer Neg Hx    Colon cancer  Neg Hx     Prior to Admission medications   Medication  Sig Start Date End Date Taking? Authorizing Provider  allopurinol (ZYLOPRIM) 100 MG tablet TAKE ONE-HALF (1/2) TABLET EVERY MONDAY, [facility] AND FRIDAY 01/13/23  Yes Jeremiah Nam, MD  amoxicillin (AMOXIL) 500 MG tablet Take by mouth. Patient takes 4 tablets prior to going to the dentist 12/28/21  Yes [provider]  aspirin 81 MG tablet Take 81 mg by mouth daily.   Yes [provider]  Cholecalciferol (VITAMIN D) 1000 UNITS capsule Take 1,000 Units by mouth daily.    Yes [provider]  diclofenac Sodium (VOLTAREN) 1 % GEL Apply 2 g topically 4 (four) times daily. 07/20/20  Yes Bast, Traci A, NP  ELIQUIS 2.5 MG TABS tablet TAKE 1 TABLET TWICE A DAY 12/08/22  Yes Runell Gess, MD  empagliflozin (JARDIANCE) 10 MG TABS tablet TAKE 1 TABLET DAILY BEFORE BREAKFAST (PLEASE CALL THE OFFICE TO ARRANGE YOUR FOLLOW UP APPOINTMENT) 07/03/23  Yes Bensimhon, Bevelyn Buckles, MD  fish oil-omega-3 fatty acids 1000 MG capsule Take 1 g by mouth daily.   Yes [provider]  hydrALAZINE (APRESOLINE) 25 MG tablet Take 1 tablet (25 mg total) by mouth daily. 12/02/22  Yes Runell Gess, MD  ipratropium (ATROVENT) 0.03 % nasal spray USE 2 SPRAYS IN EACH NOSTRIL TWICE A DAY AS NEEDED FOR RHINITIS 03/06/23  Yes Jeremiah Nam, MD  isosorbide mononitrate (IMDUR) 60 MG 24 hr tablet Take 1 tablet (60 mg total) by mouth daily. 03/01/23  Yes Weaver, Scott T, PA-C  LANTUS 100 UNIT/ML injection INJECT 20 TO 25 UNITS AT BEDTIME 08/15/22  Yes Jeremiah Nam, MD  magnesium oxide (MAG-OX) 400 MG tablet Take 1 tablet (400 mg total) by mouth every other day. 04/13/23  Yes Jeremiah Nam, MD  metoprolol tartrate (LOPRESSOR) 50 MG tablet TAKE 1 TABLET(50 MG) BY MOUTH TWICE DAILY 02/22/23  Yes Runell Gess, MD  Multiple Vitamin (MULTIVITAMIN WITH MINERALS) TABS Take 1 tablet by mouth daily.   Yes [provider]  nitroGLYCERIN (NITROSTAT) 0.4 MG SL tablet DISSOLVE 1 TABLET UNDER THE  TONGUE EVERY 5 MINUTES AS NEEDED FOR CHEST PAIN FOR A MAXIMUM OF 3 DOSES 07/01/22  Yes Runell Gess, MD  polyethylene glycol (MIRALAX / GLYCOLAX) 17 g packet Take 17 g by mouth daily.   Yes [provider]  Potassium Gluconate 595 MG CAPS Take 595 mg by mouth daily.   Yes [provider]  ranolazine (RANEXA) 500 MG 12 hr tablet TAKE 1 TABLET TWICE A DAY 03/08/23  Yes Runell Gess, MD  rosuvastatin (CRESTOR) 10 MG tablet Take 1 tablet (10 mg total) by mouth daily. 03/01/23  Yes Weaver, Scott T, PA-C  torsemide (DEMADEX) 20 MG tablet Take 2 tablets (40 mg total) every morning and 1 tablet (20 mg total) every evening. 03/16/23  Yes Weaver, Scott T, PA-C  valACYclovir (VALTREX) 1000 MG tablet Take by mouth. Used as needed for a cold sore. 08/30/22  Yes [provider]  BD INSULIN SYRINGE ULTRAFINE 31G X 5/16" 0.3 ML MISC USE DAILY AS INSTRUCTED 12/02/15   Jeremiah Nam, MD  glucose blood (FREESTYLE LITE) test strip USE TO TEST BLOOD SUGAR ONCE DAILY AND AS NEEDED. Dx E11.9 08/21/22   Jeremiah Nam, MD    No current facility-administered medications for this encounter.   Current Outpatient Medications  Medication Sig Dispense Refill   allopurinol (ZYLOPRIM) 100 MG tablet TAKE  ONE-HALF (1/2) TABLET EVERY MONDAY, WEDNESDAY AND FRIDAY 30 tablet 3   amoxicillin (AMOXIL) 500 MG tablet Take by mouth. Patient takes 4 tablets prior to going to the dentist     aspirin 81 MG tablet Take 81 mg by mouth daily.     Cholecalciferol (VITAMIN D) 1000 UNITS capsule Take 1,000 Units by mouth daily.      diclofenac Sodium (VOLTAREN) 1 % GEL Apply 2 g topically 4 (four) times daily. 50 g 0   ELIQUIS 2.5 MG TABS tablet TAKE 1 TABLET TWICE A DAY 180 tablet 3   empagliflozin (JARDIANCE) 10 MG TABS tablet TAKE 1 TABLET DAILY BEFORE BREAKFAST (PLEASE CALL THE OFFICE TO ARRANGE YOUR FOLLOW UP APPOINTMENT) 90 tablet 0   fish oil-omega-3 fatty acids 1000 MG capsule Take 1 g by mouth daily.      hydrALAZINE (APRESOLINE) 25 MG tablet Take 1 tablet (25 mg total) by mouth daily. 90 tablet 3   ipratropium (ATROVENT) 0.03 % nasal spray USE 2 SPRAYS IN EACH NOSTRIL TWICE A DAY AS NEEDED FOR RHINITIS 90 mL 3   isosorbide mononitrate (IMDUR) 60 MG 24 hr tablet Take 1 tablet (60 mg total) by mouth daily. 90 tablet 3   LANTUS 100 UNIT/ML injection INJECT 20 TO 25 UNITS AT BEDTIME 40 mL 3   magnesium oxide (MAG-OX) 400 MG tablet Take 1 tablet (400 mg total) by mouth every other day.     metoprolol tartrate (LOPRESSOR) 50 MG tablet TAKE 1 TABLET(50 MG) BY MOUTH TWICE DAILY 180 tablet 3   Multiple Vitamin (MULTIVITAMIN WITH MINERALS) TABS Take 1 tablet by mouth daily.     nitroGLYCERIN (NITROSTAT) 0.4 MG SL tablet DISSOLVE 1 TABLET UNDER THE TONGUE EVERY 5 MINUTES AS NEEDED FOR CHEST PAIN FOR A MAXIMUM OF 3 DOSES 25 tablet 11   polyethylene glycol (MIRALAX / GLYCOLAX) 17 g packet Take 17 g by mouth daily.     Potassium Gluconate 595 MG CAPS Take 595 mg by mouth daily.     ranolazine (RANEXA) 500 MG 12 hr tablet TAKE 1 TABLET TWICE A DAY 180 tablet 3   rosuvastatin (CRESTOR) 10 MG tablet Take 1 tablet (10 mg total) by mouth daily. 90 tablet 3   torsemide (DEMADEX) 20 MG tablet Take 2 tablets (40 mg total) every morning and 1 tablet (20 mg total) every evening. 270 tablet 3   valACYclovir (VALTREX) 1000 MG tablet Take by mouth. Used as needed for a cold sore.     BD INSULIN SYRINGE ULTRAFINE 31G X 5/16" 0.3 ML MISC USE DAILY AS INSTRUCTED 100 each 2   glucose blood (FREESTYLE LITE) test strip USE TO TEST BLOOD SUGAR ONCE DAILY AND AS NEEDED. Dx E11.9 300 each 3    Allergies as of 08/17/2023   (No Known Allergies)    Social History   Socioeconomic History   Marital status: Widowed    Spouse name: Not on file   Number of children: 2   Years of education: Not on file   Highest education level: Not on file  Occupational History   Occupation: Retired Surveyor, minerals. Rep. Equities trader:  RETIRED  Tobacco Use   Smoking status: Former    Current packs/day: 0.00    Average packs/day: 1 pack/day for 8.0 years (8.0 ttl pk-yrs)    Types: Cigarettes    Start date: 69    Quit date: 1958    Years since quitting: 66.8   Smokeless tobacco: Never  Tobacco comments:    "quit smoking cigarettes in 1958"  Vaping Use   Vaping status: Never Used  Substance and Sexual Activity   Alcohol use: No    Alcohol/week: 0.0 standard drinks of alcohol   Drug use: No   Sexual activity: Yes  Other Topics Concern   Not on file  Social History Narrative   From [place].  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     Lives with girlfriend Jani Gravel.  Widowed 12/2005.   Social Determinants of Health   Financial Resource Strain: Low Risk  (11/01/2022)   Overall Financial Resource Strain (CARDIA)    Difficulty of Paying Living Expenses: Not hard at all  Food Insecurity: No Food Insecurity (12/09/2022)   Hunger Vital Sign    Worried About Running Out of Food in the Last Year: Never true    Ran Out of Food in the Last Year: Never true  Transportation Needs: No Transportation Needs (12/09/2022)   PRAPARE - Administrator, Civil Service (Medical): No    Lack of Transportation (Non-Medical): No  Physical Activity: Inactive (11/01/2022)   Exercise Vital Sign    Days of Exercise per Week: 0 days    Minutes of Exercise per Session: 0 min  Stress: No Stress Concern Present (11/01/2022)   [employer] of Occupational Health - Occupational Stress Questionnaire    Feeling of Stress : Not at all  Social Connections: Moderately Isolated (11/01/2022)   Social Connection and Isolation Panel [NHANES]    Frequency of Communication with Friends and Family: More than three times a week    Frequency of Social Gatherings with Friends and Family: More than three times a week    Attends Religious Services: Never    Database administrator or Organizations: Yes     Attends Banker Meetings: 1 to 4 times per year    Marital Status: Widowed  Intimate Partner Violence: Not At Risk (11/01/2022)   Humiliation, Afraid, Rape, and Kick questionnaire    Fear of Current or Ex-Partner: No    Emotionally Abused: No    Physically Abused: No    Sexually Abused: No     Code Status   Code Status: Prior  Review of Systems: All systems reviewed and negative except where noted in HPI.  Physical Exam: Vital signs in last 24 hours: Temp:  [97.8 F (36.6 C)-98.3 F (36.8 C)] 97.8 F (36.6 C) (10/04 0537) Pulse Rate:  [40-68] 55 (10/04 0700) Resp:  [10-21] 16 (10/04 0700) BP: (150-178)/(61-88) 157/68 (10/04 0700) SpO2:  [97 %-100 %] 97 % (10/04 0700)    General:  Pleasant male in NAD. Daughter and Son in room Psych:  Cooperative. Normal mood and affect Eyes: Pupils equal Ears:  Normal auditory acuity Nose: No deformity, discharge or lesions Neck:  Supple, no masses felt Lungs:  Clear to auscultation.  Heart:  Regular rate, regular rhythm.  Abdomen:  Soft, nondistended, nontender, active bowel sounds, no masses felt Rectal :  Deferred Msk: Symmetrical without gross deformities.  Neurologic:  Alert, oriented, grossly normal neurologically Extremities : No edema Skin:  Intact without significant lesions.    Intake/Output from previous day: No intake/output data recorded. Intake/Output this shift:  No intake/output data recorded.  Active Problems:   * No active hospital problems. Willette Cluster, NP-C   08/18/2023, 9:47 AM

## 2023-08-19 ENCOUNTER — Observation Stay (HOSPITAL_COMMUNITY): Payer: Medicare Other

## 2023-08-19 DIAGNOSIS — Z794 Long term (current) use of insulin: Secondary | ICD-10-CM | POA: Diagnosis not present

## 2023-08-19 DIAGNOSIS — T189XXD Foreign body of alimentary tract, part unspecified, subsequent encounter: Secondary | ICD-10-CM

## 2023-08-19 DIAGNOSIS — I4821 Permanent atrial fibrillation: Secondary | ICD-10-CM

## 2023-08-19 DIAGNOSIS — I10 Essential (primary) hypertension: Secondary | ICD-10-CM | POA: Diagnosis not present

## 2023-08-19 DIAGNOSIS — T189XXA Foreign body of alimentary tract, part unspecified, initial encounter: Secondary | ICD-10-CM | POA: Diagnosis not present

## 2023-08-19 DIAGNOSIS — K269 Duodenal ulcer, unspecified as acute or chronic, without hemorrhage or perforation: Secondary | ICD-10-CM | POA: Diagnosis not present

## 2023-08-19 DIAGNOSIS — M109 Gout, unspecified: Secondary | ICD-10-CM

## 2023-08-19 DIAGNOSIS — I25118 Atherosclerotic heart disease of native coronary artery with other forms of angina pectoris: Secondary | ICD-10-CM

## 2023-08-19 DIAGNOSIS — R9431 Abnormal electrocardiogram [ECG] [EKG]: Secondary | ICD-10-CM

## 2023-08-19 DIAGNOSIS — N184 Chronic kidney disease, stage 4 (severe): Secondary | ICD-10-CM | POA: Diagnosis not present

## 2023-08-19 DIAGNOSIS — E119 Type 2 diabetes mellitus without complications: Secondary | ICD-10-CM | POA: Diagnosis not present

## 2023-08-19 DIAGNOSIS — Z03821 Encounter for observation for suspected ingested foreign body ruled out: Secondary | ICD-10-CM | POA: Diagnosis not present

## 2023-08-19 DIAGNOSIS — G4730 Sleep apnea, unspecified: Secondary | ICD-10-CM | POA: Diagnosis not present

## 2023-08-19 LAB — CBC
HCT: 37.6 % — ABNORMAL LOW (ref 39.0–52.0)
Hemoglobin: 11.6 g/dL — ABNORMAL LOW (ref 13.0–17.0)
MCH: 30.5 pg (ref 26.0–34.0)
MCHC: 30.9 g/dL (ref 30.0–36.0)
MCV: 98.9 fL (ref 80.0–100.0)
Platelets: 131 10*3/uL — ABNORMAL LOW (ref 150–400)
RBC: 3.8 MIL/uL — ABNORMAL LOW (ref 4.22–5.81)
RDW: 14.4 % (ref 11.5–15.5)
WBC: 6.1 10*3/uL (ref 4.0–10.5)
nRBC: 0 % (ref 0.0–0.2)

## 2023-08-19 LAB — BASIC METABOLIC PANEL
Anion gap: 8 (ref 5–15)
BUN: 36 mg/dL — ABNORMAL HIGH (ref 8–23)
CO2: 27 mmol/L (ref 22–32)
Calcium: 9.2 mg/dL (ref 8.9–10.3)
Chloride: 105 mmol/L (ref 98–111)
Creatinine, Ser: 2.78 mg/dL — ABNORMAL HIGH (ref 0.61–1.24)
GFR, Estimated: 21 mL/min — ABNORMAL LOW (ref >60)
Glucose, Bld: 125 mg/dL — ABNORMAL HIGH (ref 70–99)
Potassium: 4.3 mmol/L (ref 3.5–5.1)
Sodium: 140 mmol/L (ref 135–145)

## 2023-08-19 LAB — GLUCOSE, CAPILLARY
Glucose-Capillary: 130 mg/dL — ABNORMAL HIGH (ref 70–99)
Glucose-Capillary: 201 mg/dL — ABNORMAL HIGH (ref 70–99)

## 2023-08-19 MED ORDER — PANTOPRAZOLE SODIUM 40 MG PO TBEC: DELAYED_RELEASE_TABLET | ORAL | 0 refills | Status: DC

## 2023-08-19 NOTE — Care Management Obs Status (Signed)
MEDICARE OBSERVATION STATUS NOTIFICATION   Patient Details  Name: [name] MRN: [id number] Date of Birth: 1932-03-15   Medicare Observation Status Notification Given:  Yes    Lawerance Sabal, RN 08/19/2023, 11:30 AM

## 2023-08-19 NOTE — Plan of Care (Signed)

## 2023-08-19 NOTE — Progress Notes (Signed)
[facility] ROUNDING NOTE   Subjective: Patient feels well this morning.  No discomfort.  Had a bowel movement yesterday evening.  Tolerating regular diet with no issues.  Abdominal plain film this morning negative for foreign object.   Objective: Vital signs in last 24 hours: Temp:  [97.7 F (36.5 C)-98.3 F (36.8 C)] 97.8 F (36.6 C) (10/05 0815) Pulse Rate:  [61-99] 61 (10/05 0815) Resp:  [12-18] 18 (10/05 0815) BP: (138-183)/(64-85) 139/64 (10/05 0815) SpO2:  [95 %-100 %] 96 % (10/05 0815) Weight:  [74 kg] 74 kg (10/05 0500) Last BM Date : 08/18/23 General: NAD Lungs:  CTA b/l, no w/r/r Heart:  RRR, no m/r/g Abdomen:  Soft, NT, ND, +BS Ext:  No c/c/e    Intake/Output from previous day: 10/04 0701 - 10/05 0700 In: 300 [I.V.:300] Out: -  Intake/Output this shift: No intake/output data recorded.   Lab Results: Recent Labs    08/18/23 0551 08/19/23 0557  WBC 6.6 6.1  HGB 11.5* 11.6*  PLT 131* 131*  MCV 101.1* 98.9   BMET Recent Labs    08/18/23 0551 08/19/23 0557  NA 138 140  K 3.8 4.3  CL 100 105  CO2 27 27  GLUCOSE 106* 125*  BUN 41* 36*  CREATININE 3.00* 2.78*  CALCIUM 9.1 9.2   LFT No results for input(s): "PROT", "ALBUMIN", "AST", "ALT", "ALKPHOS", "BILITOT", "BILIDIR", "IBILI" in the last 72 hours. PT/INR No results for input(s): "INR" in the last 72 hours.    Imaging/Other results: DG Abd 1 View  Result Date: 08/19/2023 CLINICAL DATA:  Foreign body ingestion, initial encounter. EXAM: ABDOMEN - 1 VIEW COMPARISON:  One-view abdomen 08/18/2023. One-view chest x-ray 12/06/2022. FINDINGS: No radiopaque foreign body is present. Surgical clips are present at gallbladder fossa. Vascular calcifications are present. Patient is status post median sternotomy. Bowel gas pattern is unremarkable. IMPRESSION: 1. No radiopaque foreign body. 2. Status post median sternotomy. Electronically Signed   By: Marin Roberts M.D.   On: 08/19/2023  10:47   DG Abd 1 View  Result Date: 08/18/2023 CLINICAL DATA:  Ingested foreign body. EXAM: ABDOMEN - 1 VIEW COMPARISON:  August 17, 2023. FINDINGS: The bowel gas pattern is normal. Status post cholecystectomy. No definite evidence of ingested radiopaque foreign body. No radio-opaque calculi or other significant radiographic abnormality are seen. IMPRESSION: No definite evidence of ingested radiopaque foreign body. Aortic Atherosclerosis (ICD10-I70.0). Electronically Signed   By: Lupita Raider M.D.   On: 08/18/2023 08:27   DG Chest 2 View  Result Date: 08/18/2023 CLINICAL DATA:  Swallowed foreign body. EXAM: CHEST - 2 VIEW COMPARISON:  December 06, 2022. FINDINGS: Stable cardiomediastinal silhouette. Status post coronary artery bypass graft. Elevated left hemidiaphragm is noted. Both lungs are clear. The visualized skeletal structures are unremarkable. IMPRESSION: No active cardiopulmonary disease. No definite evidence of ingested foreign body on these radiographs. Aortic Atherosclerosis (ICD10-I70.0). Electronically Signed   By: Lupita Raider M.D.   On: 08/18/2023 08:24   DG Neck Soft Tissue  Result Date: 08/18/2023 CLINICAL DATA:  Ingested foreign body. EXAM: NECK SOFT TISSUES - 1+ VIEW COMPARISON:  Same day. FINDINGS: There is no evidence of retropharyngeal soft tissue swelling or epiglottic enlargement. The cervical airway is unremarkable and no radio-opaque foreign body identified. The ingested soda tab noted on prior radiograph is no longer visualized. Carotid artery calcifications and surgical staples are seen in the right sided cervical tissues. IMPRESSION: Ingested soda tab noted on prior radiograph is no longer visualized. Electronically  Signed   By: Lupita Raider M.D.   On: 08/18/2023 08:21   DG Neck Soft Tissue  Result Date: 08/18/2023 CLINICAL DATA:  Accidental soda tab ingestion, initial encounter EXAM: NECK SOFT TISSUES - 1+ VIEW COMPARISON:  None Available. FINDINGS: No  prevertebral soft tissue swelling is noted. Degenerative changes of the cervical spine are seen. Radiopaque foreign body is noted within the proximal cervical esophagus which corresponds to the history of ingestion soda tab. It is better visualized on the lateral projection than on the frontal film. IMPRESSION: Ingestion soda tab in the proximal cervical esophagus. This is best visualized on the lateral projection. Electronically Signed   By: Alcide Clever M.D.   On: 08/18/2023 02:21   DG Abd 2 Views  Result Date: 08/17/2023 CLINICAL DATA:  Swallowed soda top. EXAM: ABDOMEN - 2 VIEW COMPARISON:  None Available. FINDINGS: Prior CABG. Density projects over the left lower chest, presumably surgical clips. Heart is normal size. Aortic atherosclerosis. No confluent airspace opacities or effusions. Prior cholecystectomy. No unexpected radiopaque foreign body in the abdomen or pelvis. Diffuse aortic atherosclerosis. No bowel obstruction or free air. IMPRESSION: Radiopaque density projects over the left lower chest, presumably surgical clips. Otherwise no unexpected radiopaque foreign body. No acute findings. Electronically Signed   By: Charlett Nose M.D.   On: 08/17/2023 23:58      Assessment and Plan:  89 year old male with history of A-fib, hypertension, CKD, diabetes with advertent foreign body in ingestion (soda can tab).  Initially noted in the esophagus, but subsequent EGD negative for any metal object.  All imaging subsequent to that has been negative for a retained foreign object, including abdominal plain film this morning.  He is eating and passing stool and gas with no problems.  It appears the soda can has passed.  Okay to discharge home from GI standpoint.  No follow-up needed.  Patient advised to return to ER if he develops severe abdominal pain, nausea/vomiting.  Foreign body ingestion - Appears to have resolved spontaneously - No further workup evaluation recommended - Okay to discharge home  from GI standpoint     Jenel Lucks, MD  08/19/2023, 12:14 PM [facility]

## 2023-08-19 NOTE — Discharge Summary (Signed)
Physician Discharge Summary  [name] NWG:[id number] DOB: [birth year] DOA: 08/17/2023  PCP: Joaquim Nam, MD  Admit date: 08/17/2023 Discharge date: 08/19/2023  Admitted From: Home  Discharge disposition: Home   Recommendations for Outpatient Follow-Up:   Follow up with your primary care provider in one week.  Check CBC, BMP, magnesium in the next visit Will need to follow-up H. pylori testing, continue PPI twice daily for 6 weeks followed by once a day.   Discharge Diagnosis:   Principal Problem:   Foreign body ingestion, initial encounter Active Problems:   Prolonged QT interval   Permanent atrial fibrillation ([name])   Insulin dependent type 2 diabetes mellitus ([name])   CKD (chronic kidney disease) stage 4, GFR 15-29 ml/min ([name])   CAD (coronary artery disease)   HFrEF (heart failure with reduced ejection fraction) ([name])   HTN (hypertension)   HLD (hyperlipidemia)   Gout   Sleep apnea   GERD   Gastritis and gastroduodenitis   Duodenal ulcer   Discharge Condition: Improved.  Diet recommendation: Low sodium, heart healthy.  Carbohydrate-modified.    Wound care: None.  Code status: Full.   History of Present Illness:   [name] is a 89 y.o. male with medical history significant of atrial fibrillation on eliquis , CAD s/p CABG, CHF, CKD stage 4, T2DM, HTN, HLD, gout, s/p mitral valve repair and OSA on cpap who presented to ED with complaints of swallowing a soda can tab and feeling like it was stuck in his throat. He states around 9pm he swallowed the tab and it felt stuck. He could breath fine.  After coming to ED he dozed off and felt better when he woke up which had them re xray him.  He states it is still sore to swallow,but no longer feels like something is rubbing against his throat. Initial neck xray around 2AM showed foreign body in proximal cervical esophagus. On repeat around 8AM, this was no longer visualized.    Last dose of eliquis at  08/17/23 at 8AM.    He has been feeling good. Denies any fever/chills, vision changes/headaches, chest pain or palpitations, shortness of breath or cough, abdominal pain, N/V/D, dysuria or leg swelling.     He does not smoke or drink alcohol.    Hospital Course:   Following conditions were addressed during hospitalization as listed below,  ER Course:  vitals: afebrile, bp: 157/70, HR: 59, RR: 18, oxygen: 100%RA Pertinent labs: hgb: 11.5, MCV: 101, platelets 131, BUN: 41, creatinine: 3.00  Neck and soft tissue xray initial: Ingestion soda tab in the proximal cervical esophagus. This is best visualized on the lateral projection. Repeat: no longer visualized  CXR: no foreign body on xray  Abdomen xray: no definite ingested radiopaque foreign body seen.  In ED: ENT initially called, but recommended transfer to [facility] due to not having capabilities to remove. After he swallowed GI called and plans for EGD today.      Disposition.  At this time, patient is stable for disposition  Medical Consultants:   None.  Procedures:    *** Subjective:   Today, patient   Discharge Exam:   Vitals:   08/19/23 0428 08/19/23 0815  BP: 138/68 139/64  Pulse:  61  Resp: 16 18  Temp: 98.3 F (36.8 C) 97.8 F (36.6 C)  SpO2: 99% 96%   Vitals:   08/18/23 2308 08/19/23 0428 08/19/23 0500 08/19/23 0815  BP: (!) 147/70 138/68  139/64  Pulse: 63  61  Resp:  16  18  Temp: 98 F (36.7 C) 98.3 F (36.8 C)  97.8 F (36.6 C)  TempSrc: Oral Oral    SpO2: 100% 99%  96%  Weight:   74 kg     General: Alert awake, not in obvious distress HENT: pupils equally reacting to light,  No scleral pallor or icterus noted. Oral mucosa is moist.  Chest:  Clear breath sounds.  Diminished breath sounds bilaterally. No crackles or wheezes.  CVS: S1 &S2 heard. No murmur.  Regular rate and rhythm. Abdomen: Soft, nontender, nondistended.  Bowel sounds are heard.   Extremities: No cyanosis, clubbing or edema.   Peripheral pulses are palpable. Psych: Alert, awake and oriented, normal mood CNS:  No cranial nerve deficits.  Power equal in all extremities.   Skin: Warm and dry.  No rashes noted.  The results of significant diagnostics from this hospitalization (including imaging, microbiology, ancillary and laboratory) are listed below for reference.     Diagnostic Studies:   DG Abd 1 View  Result Date: 08/18/2023 CLINICAL DATA:  Ingested foreign body. EXAM: ABDOMEN - 1 VIEW COMPARISON:  August 17, 2023. FINDINGS: The bowel gas pattern is normal. Status post cholecystectomy. No definite evidence of ingested radiopaque foreign body. No radio-opaque calculi or other significant radiographic abnormality are seen. IMPRESSION: No definite evidence of ingested radiopaque foreign body. Aortic Atherosclerosis (ICD10-I70.0). Electronically Signed   By: Lupita Raider M.D.   On: 08/18/2023 08:27   DG Chest 2 View  Result Date: 08/18/2023 CLINICAL DATA:  Swallowed foreign body. EXAM: CHEST - 2 VIEW COMPARISON:  December 06, 2022. FINDINGS: Stable cardiomediastinal silhouette. Status post coronary artery bypass graft. Elevated left hemidiaphragm is noted. Both lungs are clear. The visualized skeletal structures are unremarkable. IMPRESSION: No active cardiopulmonary disease. No definite evidence of ingested foreign body on these radiographs. Aortic Atherosclerosis (ICD10-I70.0). Electronically Signed   By: Lupita Raider M.D.   On: 08/18/2023 08:24   DG Neck Soft Tissue  Result Date: 08/18/2023 CLINICAL DATA:  Ingested foreign body. EXAM: NECK SOFT TISSUES - 1+ VIEW COMPARISON:  Same day. FINDINGS: There is no evidence of retropharyngeal soft tissue swelling or epiglottic enlargement. The cervical airway is unremarkable and no radio-opaque foreign body identified. The ingested soda tab noted on prior radiograph is no longer visualized. Carotid artery calcifications and surgical staples are seen in the right sided  cervical tissues. IMPRESSION: Ingested soda tab noted on prior radiograph is no longer visualized. Electronically Signed   By: Lupita Raider M.D.   On: 08/18/2023 08:21   DG Neck Soft Tissue  Result Date: 08/18/2023 CLINICAL DATA:  Accidental soda tab ingestion, initial encounter EXAM: NECK SOFT TISSUES - 1+ VIEW COMPARISON:  None Available. FINDINGS: No prevertebral soft tissue swelling is noted. Degenerative changes of the cervical spine are seen. Radiopaque foreign body is noted within the proximal cervical esophagus which corresponds to the history of ingestion soda tab. It is better visualized on the lateral projection than on the frontal film. IMPRESSION: Ingestion soda tab in the proximal cervical esophagus. This is best visualized on the lateral projection. Electronically Signed   By: Alcide Clever M.D.   On: 08/18/2023 02:21   DG Abd 2 Views  Result Date: 08/17/2023 CLINICAL DATA:  Swallowed soda top. EXAM: ABDOMEN - 2 VIEW COMPARISON:  None Available. FINDINGS: Prior CABG. Density projects over the left lower chest, presumably surgical clips. Heart is normal size. Aortic atherosclerosis. No confluent airspace opacities  or effusions. Prior cholecystectomy. No unexpected radiopaque foreign body in the abdomen or pelvis. Diffuse aortic atherosclerosis. No bowel obstruction or free air. IMPRESSION: Radiopaque density projects over the left lower chest, presumably surgical clips. Otherwise no unexpected radiopaque foreign body. No acute findings. Electronically Signed   By: Charlett Nose M.D.   On: 08/17/2023 23:58     Labs:   Basic Metabolic Panel: Recent Labs  Lab 08/18/23 0551 08/18/23 1448 08/19/23 0557  NA 138  --  140  K 3.8  --  4.3  CL 100  --  105  CO2 27  --  27  GLUCOSE 106*  --  125*  BUN 41*  --  36*  CREATININE 3.00*  --  2.78*  CALCIUM 9.1  --  9.2  MG  --  2.3  --    GFR Estimated Creatinine Clearance: 16.8 mL/min (A) (by C-G formula based on SCr of 2.78 mg/dL  (H)). Liver Function Tests: No results for input(s): "AST", "ALT", "ALKPHOS", "BILITOT", "PROT", "ALBUMIN" in the last 168 hours. No results for input(s): "LIPASE", "AMYLASE" in the last 168 hours. No results for input(s): "AMMONIA" in the last 168 hours. Coagulation profile No results for input(s): "INR", "PROTIME" in the last 168 hours.  CBC: Recent Labs  Lab 08/18/23 0551 08/19/23 0557  WBC 6.6 6.1  NEUTROABS 3.6  --   HGB 11.5* 11.6*  HCT 36.3* 37.6*  MCV 101.1* 98.9  PLT 131* 131*   Cardiac Enzymes: No results for input(s): "CKTOTAL", "CKMB", "CKMBINDEX", "TROPONINI" in the last 168 hours. BNP: Invalid input(s): "POCBNP" CBG: Recent Labs  Lab 08/18/23 1049 08/18/23 1233 08/18/23 1612 08/18/23 2304 08/19/23 0933  GLUCAP 97 97 176* 212* 130*   D-Dimer No results for input(s): "DDIMER" in the last 72 hours. Hgb A1c No results for input(s): "HGBA1C" in the last 72 hours. Lipid Profile No results for input(s): "CHOL", "HDL", "LDLCALC", "TRIG", "CHOLHDL", "LDLDIRECT" in the last 72 hours. Thyroid function studies No results for input(s): "TSH", "T4TOTAL", "T3FREE", "THYROIDAB" in the last 72 hours.  Invalid input(s): "FREET3" Anemia work up No results for input(s): "VITAMINB12", "FOLATE", "FERRITIN", "TIBC", "IRON", "RETICCTPCT" in the last 72 hours. Microbiology No results found for this or any previous visit (from the past 240 hour(s)).   Discharge Instructions:   Discharge Instructions     Diet - low sodium heart healthy   Complete by: As directed    Discharge instructions   Complete by: As directed    Follow-up with your primary care provider in 1 week.  Seek medical attention for worsening symptoms including increasing abdominal pain, constipation, inability to pass gas, vomiting.   Increase activity slowly   Complete by: As directed       Allergies as of 08/19/2023   No Known Allergies      Medication List     TAKE these medications     allopurinol 100 MG tablet Commonly known as: ZYLOPRIM TAKE ONE-HALF (1/2) TABLET EVERY MONDAY, WEDNESDAY AND FRIDAY   amoxicillin 500 MG tablet Commonly known as: AMOXIL Take by mouth. Patient takes 4 tablets prior to going to the dentist   aspirin 81 MG tablet Take 81 mg by mouth daily.   BD Insulin Syringe U/F 31G X 5/16" 0.3 ML Misc Generic drug: Insulin Syringe-Needle U-100 USE DAILY AS INSTRUCTED   diclofenac Sodium 1 % Gel Commonly known as: Voltaren Apply 2 g topically 4 (four) times daily.   Eliquis 2.5 MG Tabs tablet Generic drug: apixaban TAKE  1 TABLET TWICE A DAY   fish oil-omega-3 fatty acids 1000 MG capsule Take 1 g by mouth daily.   FREESTYLE LITE test strip Generic drug: glucose blood USE TO TEST BLOOD SUGAR ONCE DAILY AND AS NEEDED. Dx E11.9   hydrALAZINE 25 MG tablet Commonly known as: APRESOLINE Take 1 tablet (25 mg total) by mouth daily.   ipratropium 0.03 % nasal spray Commonly known as: ATROVENT USE 2 SPRAYS IN EACH NOSTRIL TWICE A DAY AS NEEDED FOR RHINITIS   isosorbide mononitrate 60 MG 24 hr tablet Commonly known as: IMDUR Take 1 tablet (60 mg total) by mouth daily.   Jardiance 10 MG Tabs tablet Generic drug: empagliflozin TAKE 1 TABLET DAILY BEFORE BREAKFAST (PLEASE CALL THE OFFICE TO ARRANGE YOUR FOLLOW UP APPOINTMENT)   Lantus 100 UNIT/ML injection Generic drug: insulin glargine INJECT 20 TO 25 UNITS AT BEDTIME   magnesium oxide 400 MG tablet Commonly known as: MAG-OX Take 1 tablet (400 mg total) by mouth every other day.   metoprolol tartrate 50 MG tablet Commonly known as: LOPRESSOR TAKE 1 TABLET(50 MG) BY MOUTH TWICE DAILY   multivitamin with minerals Tabs tablet Take 1 tablet by mouth daily.   nitroGLYCERIN 0.4 MG SL tablet Commonly known as: NITROSTAT DISSOLVE 1 TABLET UNDER THE TONGUE EVERY 5 MINUTES AS NEEDED FOR CHEST PAIN FOR A MAXIMUM OF 3 DOSES   pantoprazole 40 MG tablet Commonly known as: PROTONIX Take 1  tablet (40 mg total) by mouth 2 (two) times daily for 42 days, THEN 1 tablet (40 mg total) daily. Start taking on: August 19, 2023   polyethylene glycol 17 g packet Commonly known as: MIRALAX / GLYCOLAX Take 17 g by mouth daily.   Potassium Gluconate 595 MG Caps Take 595 mg by mouth daily.   ranolazine 500 MG 12 hr tablet Commonly known as: RANEXA TAKE 1 TABLET TWICE A DAY   rosuvastatin 10 MG tablet Commonly known as: CRESTOR Take 1 tablet (10 mg total) by mouth daily.   torsemide 20 MG tablet Commonly known as: DEMADEX Take 2 tablets (40 mg total) every morning and 1 tablet (20 mg total) every evening.   valACYclovir 1000 MG tablet Commonly known as: VALTREX Take by mouth. Used as needed for a cold sore.   Vitamin D 1000 units capsule Take 1,000 Units by mouth daily.          Time coordinating discharge: 39 minutes  Signed:  Ragan Duhon  [facility] 08/19/2023, 11:23 AM

## 2023-08-21 ENCOUNTER — Telehealth: Payer: Self-pay

## 2023-08-21 ENCOUNTER — Encounter (HOSPITAL_COMMUNITY): Payer: Self-pay | Admitting: Gastroenterology

## 2023-08-21 NOTE — Transitions of Care (Post Inpatient/ED Visit) (Signed)
08/21/2023  Name: [name] MRN: [id number] DOB: November 28, 1931  Today's TOC FU Call Status: Today's TOC FU Call Status:: Successful TOC FU Call Completed TOC FU Call Complete Date: 08/21/23 Patient's Name and Date of Birth confirmed.  Transition Care Management Follow-up Telephone Call Date of Discharge: 08/19/23 Discharge Facility: [facility]) Type of Discharge: Inpatient Admission Primary Inpatient Discharge Diagnosis:: Foreign body ingestion How have you been since you were released from the hospital?: Better Any questions or concerns?: No  Items Reviewed: Did you receive and understand the discharge instructions provided?: Yes Medications obtained,verified, and reconciled?: Yes (Medications Reviewed) Any new allergies since your discharge?: No Dietary orders reviewed?: NA Do you have support at home?: Yes People in Home: child(ren), adult  Medications Reviewed Today: Medications Reviewed Today   Medications were not reviewed in this encounter     Home Care and Equipment/Supplies: Were Home Health Services Ordered?: No Any new equipment or medical supplies ordered?: No  Functional Questionnaire: Do you need assistance with bathing/showering or dressing?: No Do you need assistance with meal preparation?: No Do you need assistance with eating?: No Do you have difficulty maintaining continence: No Do you need assistance with getting out of bed/getting out of a chair/moving?: No Do you have difficulty managing or taking your medications?: No  Follow up appointments reviewed: PCP Follow-up appointment confirmed?: Yes Date of PCP follow-up appointment?: 08/25/23 Follow-up Provider: Dr. Para March Specialist [facility] Follow-up appointment confirmed?: NA Do you need transportation to your follow-up appointment?: No Do you understand care options if your condition(s) worsen?: Yes-patient verbalized understanding    SIGNATURE Kandis Fantasia, LPN [facility]  Advisor [facility] You Are. We Are. [facility] Direct Dial [phone]

## 2023-08-21 NOTE — Telephone Encounter (Signed)
Noted. Thanks.

## 2023-08-25 ENCOUNTER — Ambulatory Visit: Payer: Medicare Other | Admitting: Family Medicine

## 2023-08-25 ENCOUNTER — Encounter: Payer: Self-pay | Admitting: Family Medicine

## 2023-08-25 VITALS — BP 140/74 | HR 59 | Temp 97.6°F | Ht 67.0 in | Wt 162.8 lb

## 2023-08-25 DIAGNOSIS — Z794 Long term (current) use of insulin: Secondary | ICD-10-CM | POA: Diagnosis not present

## 2023-08-25 DIAGNOSIS — N184 Chronic kidney disease, stage 4 (severe): Secondary | ICD-10-CM | POA: Diagnosis not present

## 2023-08-25 DIAGNOSIS — E119 Type 2 diabetes mellitus without complications: Secondary | ICD-10-CM | POA: Diagnosis not present

## 2023-08-25 DIAGNOSIS — K269 Duodenal ulcer, unspecified as acute or chronic, without hemorrhage or perforation: Secondary | ICD-10-CM | POA: Diagnosis not present

## 2023-08-25 DIAGNOSIS — K2970 Gastritis, unspecified, without bleeding: Secondary | ICD-10-CM

## 2023-08-25 MED ORDER — PANTOPRAZOLE SODIUM 40 MG PO TBEC: DELAYED_RELEASE_TABLET | ORAL | 0 refills | Status: DC

## 2023-08-25 MED ORDER — TORSEMIDE 20 MG PO TABS: ORAL_TABLET | ORAL | Status: DC

## 2023-08-25 MED ORDER — PANTOPRAZOLE SODIUM 40 MG PO TBEC: DELAYED_RELEASE_TABLET | ORAL | Status: DC

## 2023-08-25 NOTE — Patient Instructions (Signed)
Go to the lab on the way out.   If you have mychart we'll likely use that to update you.    Take care.  Glad to see you. Start protonix after you collect a stool sample.

## 2023-08-25 NOTE — Progress Notes (Signed)
Admit date: 08/17/2023 Discharge date: 08/19/2023   Admitted From: Home Discharge disposition: Home   Recommendations for Outpatient Follow-Up:   Follow up with your primary care provider in one week.  Attention should be made for any nausea vomiting abdominal pain and constipation. Check CBC, BMP, magnesium in the next visit Will need to follow-up H. pylori testing,  continue PPI twice daily for 6 weeks followed by once a day. Discharge Diagnosis:   Principal Problem:   Foreign body ingestion, initial encounter Active Problems:   Prolonged QT interval   Permanent atrial fibrillation ([name])   Insulin dependent type 2 diabetes mellitus ([name])   CKD (chronic kidney disease) stage 4, GFR 15-29 ml/min ([name])   CAD (coronary artery disease)   HFrEF (heart failure with reduced ejection fraction) ([name])   HTN (hypertension)   HLD (hyperlipidemia)   Gout   Sleep apnea   GERD   Gastritis and gastroduodenitis   Duodenal ulcer   Discharge Condition: Improved.   Diet recommendation: Low sodium, heart healthy.  Carbohydrate-modified.     Wound care: None.   Code status: Full.   History of Present Illness:   [name] is a 89 y.o. male with medical history significant of atrial fibrillation on eliquis , CAD s/p CABG, CHF, CKD stage 4, T2DM, HTN, HLD, gout, s/p mitral valve repair and OSA on CPAP presented to hospital with complaints of swallowing a soda can tab which he felt like got stuck in his throat and presented to the hospital.  In the ED patient had slightly elevated blood pressure with elevated creatinine at 3.0.  X-ray of the soft tissue of the neck showed metallic object in the proximal cervical esophagus.  Chest x-ray did not show any foreign body on the chest.  ENT was consulted but subsequently since he had swallowed GI was called  Hospital Course:   Following conditions were addressed during hospitalization as listed below,   Foreign body ingestion.   X-ray of the neck  initial showed foreign body in the proximal esophagus but repeat did not show any.  GI was consulted and underwent EGD with no findings of the foreign body.  Had some superficial gastritis and recommended PPI twice daily for 6 weeks followed by once daily.  Follow-up abdominal x-ray did not show any metallic objects.  Communicated with GI prior to discharge and patient was considered stable for disposition home.  He did not have any nausea vomiting abdominal pain and had a bowel movement.   Prolonged QT interval Improved.   Permanent atrial fibrillation  Continue eliquis, lopressor on discharge.  Was rate controlled   Insulin dependent type 2 diabetes mellitus  Recent hemoglobin A1c of 6.9 in 03/2023.  Continue Jardiance and insulin from home.     CKD (chronic kidney disease) stage 4, GFR 15-29 ml/min ([name]) Followed by nephrology, Dr. Allena Katz.  Baseline creatinine between 3.2-3.4.   CAD (coronary artery disease) Continue ASA, lopressor, imdur, crestor, ranexa    HFrEF (heart failure with reduced ejection fraction) ([name]) Appears compensated.  Continue Demadex blood pressure.  Continue strict intake and output charting   Essential HTN (hypertension) Will continue Lopressor 50mg  BID, hydralazine 25mg  daily, torsemide 40mg  AM/20mg  PM from home.   HLD (hyperlipidemia) Continue crestor 10mg  daily    Gout Continue allopurinol. Takes three times a week    Sleep apnea Cpap nightly, but doesn't use nightly. Declined while in the hospital.   Disposition.  At this time, patient is stable for disposition home  with outpatient PCP follow-up.   Medical Consultants: GI   Procedures: EGD with gastric biopsy    ==================== Admitted after swallowing foreign body.  Inpatient imaging reviewed and discussed with patient.  Follow-up imaging did not demonstrate foreign body.  The presumption is that he passed the object.  Discussed.  He had EGD done.  He had duodenal ulcers noted.  No biopsy  was done because of his anticoagulation.  He did not start PPI in the meantime.  Discussed rationale for H. pylori testing.  Discussed getting sample collected before starting PPI, given his situation.  He is not having abdominal pain and he feels well.  He has incidentally noted fingernail fungus.  We talked about options but we both agreed it was reasonable to defer treatment given his other medical issues and the risk with systemic Lamisil treatment.  History of chronic kidney disease.  Recheck creatinine pending.  Creatinine was elevated while inpatient.  Discussed with patient.  History of diabetes.  Compliant with Lantus and Jardiance.  Sugar has been 70-140 usually at home.  Weight ~mid 150 lbs and steady.    Meds, vitals, and allergies reviewed.   ROS: Per HPI unless specifically indicated in ROS section   GEN: nad, alert and oriented HEENT: ncat NECK: supple w/o LA CV: IRR with SEM not tachy.  PULM: ctab, no inc wob ABD: soft, +bs EXT: Trace BLE edema.   SKIN: no acute rash Chronic nail changes noted on the fingernails.   35 minutes were devoted to patient care in this encounter (this includes time spent reviewing the patient's file/history, interviewing and examining the patient, counseling/reviewing plan with patient).

## 2023-08-26 LAB — BASIC METABOLIC PANEL
BUN/Creatinine Ratio: 14 (calc) (ref 6–22)
BUN: 38 mg/dL — ABNORMAL HIGH (ref 7–25)
CO2: 28 mmol/L (ref 20–32)
Calcium: 9.8 mg/dL (ref 8.6–10.3)
Chloride: 101 mmol/L (ref 98–110)
Creat: 2.8 mg/dL — ABNORMAL HIGH (ref 0.70–1.22)
Glucose, Bld: 136 mg/dL — ABNORMAL HIGH (ref 65–99)
Potassium: 4.4 mmol/L (ref 3.5–5.3)
Sodium: 140 mmol/L (ref 135–146)

## 2023-08-26 LAB — CBC WITH DIFFERENTIAL/PLATELET
Absolute Monocytes: 517 {cells}/uL (ref 200–950)
Basophils Absolute: 41 {cells}/uL (ref 0–200)
Basophils Relative: 0.6 %
Eosinophils Absolute: 469 {cells}/uL (ref 15–500)
Eosinophils Relative: 6.9 %
HCT: 40.1 % (ref 38.5–50.0)
Hemoglobin: 12.7 g/dL — ABNORMAL LOW (ref 13.2–17.1)
Lymphs Abs: 1503 {cells}/uL (ref 850–3900)
MCH: 30.8 pg (ref 27.0–33.0)
MCHC: 31.7 g/dL — ABNORMAL LOW (ref 32.0–36.0)
MCV: 97.1 fL (ref 80.0–100.0)
MPV: 10.9 fL (ref 7.5–12.5)
Monocytes Relative: 7.6 %
Neutro Abs: 4270 {cells}/uL (ref 1500–7800)
Neutrophils Relative %: 62.8 %
Platelets: 160 10*3/uL (ref 140–400)
RBC: 4.13 10*6/uL — ABNORMAL LOW (ref 4.20–5.80)
RDW: 13 % (ref 11.0–15.0)
Total Lymphocyte: 22.1 %
WBC: 6.8 10*3/uL (ref 3.8–10.8)

## 2023-08-26 NOTE — Assessment & Plan Note (Signed)
Recheck creatinine pending.  Creatinine was elevated while inpatient.  Discussed with patient.  Continue Jardiance hydralazine isosorbide metoprolol torsemide.

## 2023-08-26 NOTE — Assessment & Plan Note (Addendum)
Compliant with Lantus and Jardiance.  Sugar has been 70-140 usually at home.  Weight ~mid 150 lbs and steady.   Continue as is.

## 2023-08-26 NOTE — Assessment & Plan Note (Signed)
Start protonix after collecting a stool sample for H. pylori.

## 2023-08-28 ENCOUNTER — Other Ambulatory Visit: Payer: Self-pay

## 2023-08-28 DIAGNOSIS — K269 Duodenal ulcer, unspecified as acute or chronic, without hemorrhage or perforation: Secondary | ICD-10-CM | POA: Diagnosis not present

## 2023-08-28 DIAGNOSIS — K2970 Gastritis, unspecified, without bleeding: Secondary | ICD-10-CM

## 2023-08-30 LAB — H. PYLORI ANTIGEN, STOOL: H pylori Ag, Stl: NEGATIVE

## 2023-09-11 ENCOUNTER — Other Ambulatory Visit: Payer: Self-pay | Admitting: Family Medicine

## 2023-10-03 ENCOUNTER — Other Ambulatory Visit (HOSPITAL_COMMUNITY): Payer: Self-pay | Admitting: Internal Medicine

## 2023-10-04 ENCOUNTER — Ambulatory Visit: Payer: Medicare Other | Attending: Cardiovascular Disease | Admitting: Cardiovascular Disease

## 2023-10-04 ENCOUNTER — Encounter: Payer: Self-pay | Admitting: Cardiovascular Disease

## 2023-10-04 VITALS — BP 130/62 | HR 60 | Ht 67.0 in | Wt 166.8 lb

## 2023-10-04 DIAGNOSIS — I6523 Occlusion and stenosis of bilateral carotid arteries: Secondary | ICD-10-CM | POA: Diagnosis not present

## 2023-10-04 DIAGNOSIS — Z951 Presence of aortocoronary bypass graft: Secondary | ICD-10-CM | POA: Diagnosis not present

## 2023-10-04 DIAGNOSIS — R600 Localized edema: Secondary | ICD-10-CM | POA: Diagnosis not present

## 2023-10-04 DIAGNOSIS — E782 Mixed hyperlipidemia: Secondary | ICD-10-CM | POA: Insufficient documentation

## 2023-10-04 DIAGNOSIS — I10 Essential (primary) hypertension: Secondary | ICD-10-CM | POA: Insufficient documentation

## 2023-10-04 DIAGNOSIS — I4821 Permanent atrial fibrillation: Secondary | ICD-10-CM | POA: Diagnosis not present

## 2023-10-04 DIAGNOSIS — I340 Nonrheumatic mitral (valve) insufficiency: Secondary | ICD-10-CM | POA: Insufficient documentation

## 2023-10-04 NOTE — Assessment & Plan Note (Signed)
History of CAD status post bypass grafting in 1996 with redo by Dr. Tyrone Sage in 2006.  Dr. Katrinka Blazing In July 2015 in setting of unstable angina.  He stented his obtuse marginal vein graft.  I performed cardiac catheterization on him 08/02/2018 revealing a newly occluded RCA vein graft with high-grade first obtuse marginal branch ostial stenosis which I was unable to access because of geometry.  It was elected to treat him medically.  He is on Imdur.  He denies chest pain.

## 2023-10-04 NOTE — Assessment & Plan Note (Signed)
History of severe mitral regurgitation status post successful edged edge A2/P2 clipping by Dr. Excell Seltzer 02/06/2020.  EF at that time was 35 to 40%.  His most recent 2D echo performed 11/24/2022 revealed an EF of 35 to 40% with moderate pulmonary hypertension and mild mitral regurgitation.

## 2023-10-04 NOTE — Assessment & Plan Note (Signed)
History of essential hypertension blood pressure measured today at 130/62.  He is on hydralazine and metoprolol.

## 2023-10-04 NOTE — Progress Notes (Signed)
10/04/2023 [name]   Aug 23, 1932  [id number]  Primary Physician Joaquim Nam, MD Primary Cardiologist: Runell Gess MD Milagros Loll, [place], [place]  HPI:  [name] is a 89 y.o.   single Caucasian male  formerly a patient of Dr. Tora Perches. I last saw him in the office 11/30/2022. Mr. Ponce is the father of one daughter and one son , the grandfather to 2 grandchildren.  He is accompanied by his son Royden Purl.  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 His past medical history is remarkable for coronary artery bypass grafting in 1996 and again in 2006 by Dr. Nydia Bouton. He had remote right carotid endarterectomy by Dr. Arbie Cookey in 1994. History of hypertension, hyperlipidemia and diabetes. He was hospitalized in late July 2015 because of unstable angina. Dr. Katrinka Blazing performed cardiac catheterization revealing a high-grade obtuse marginal branch vein graft stenosis which was stented. Because of recurrent chest pain he was re-intervention relates later and found to have a new hazy lesion just proximal to the previously placed stent which was again intervened on. He was deemed a Plavix nonresponder and was begun on Brilenta. He also has a remote history of PAF as well as diverticulitis and has had GI bleed on Coumadin. His oral anticoagulation was discontinued.    He was recently admitted on 11/13/15 for a GI bleed. He underwent endoscopy revealing gastritis, colonoscopy revealing bleeding diverticula which was treated with clipping. He had 3 units of packed red blood cell transfusion. He did have chest pain and positive enzymes after his endoscopy within the echo that showed an EF of 50% with a new anterior wall motion and reality. He was thought to have "a non-STEMI.  His Plavix was discontinued. Because of his moderate renal insufficiency and his inability to take antiplatelet therapy he did not undergo repeat cardiac catheterization. He underwent  left-sided hemicolectomy and sigmoidectomy because of chronic GI bleeding. This was uneventful. His hemoglobin has remained stable in the 12 range last checked in April.   Because of his new onset chest pain and dyspnea performed a Myoview stress test that showed a new inferolateral scar/ischemic abnormality and echo that showed segmental wall motion abnormalities.  Because of this I performed diagnostic coronary arteriography on him 08/02/2018 revealing a newly occluded RCA vein graft with a high-grade first circumflex obtuse marginal branch ostial stenosis which I was unable to access because of geometry.  S   He saw Corine Shelter back in the office 03/20/2019 complaining of increasing dyspnea.  Low-dose long-acting nitrates were added.  Hemoglobin was stable at 11.  He continues to complain of increasing dyspnea.  He is in A. fib with a controlled ventricular response on Eliquis.     He had a right heart cath by Dr. Gala Romney for severe MR which he was symptomatic from.  He ultimately underwent successful edge-to-edge A2/P2 mitral clipping by Dr. Excell Seltzer 02/06/2020 Allyson Sabal resulting in reduction of 4+ to 1+ MR. 11/24/2022 revealed a decline in his EF from 50 to 55% down to 35 to 40%.  He did have moderate pulm hypertension which is unchanged.  He had mild MR status post mitral clipping.  He had probably at least moderate aortic stenosis (low gradient) with an aortic valve area measured 0.87 cm.   Recently he has noticed increasing fatigue, dyspnea and chest pain.  He saw Wallis Bamberg and Tereso Newcomer in the office approximately week ago who began him on  Imdur.  His chest pain has improved.  It has been 4 years since his last cath although now his serum creatinine is 3.7 making invasive evaluation problematic.  He wishes to continue with medical therapy.  Since I saw him a year ago he is remained stable.  He denies chest pain or shortness of breath but does complain of fatigue.  He is on torsemide twice daily  for lower extremity edema.  Carotid Dopplers showed a widely patent right endarterectomy site with moderate left ICA stenosis which we will continue to follow.  2D echo performed 11/24/2022 revealed EF of 35 to 40% with moderate pulmonary hypertension and only mild MR status post mitral clipping.   Current Meds  Medication Sig   allopurinol (ZYLOPRIM) 100 MG tablet TAKE ONE-HALF (1/2) TABLET EVERY MONDAY, WEDNESDAY AND FRIDAY   amoxicillin (AMOXIL) 500 MG tablet Take by mouth. Patient takes 4 tablets prior to going to the dentist   aspirin 81 MG tablet Take 81 mg by mouth daily.   BD INSULIN SYRINGE ULTRAFINE 31G X 5/16" 0.3 ML MISC USE DAILY AS INSTRUCTED   Cholecalciferol (VITAMIN D) 1000 UNITS capsule Take 1,000 Units by mouth daily.    diclofenac Sodium (VOLTAREN) 1 % GEL Apply 2 g topically 4 (four) times daily.   ELIQUIS 2.5 MG TABS tablet TAKE 1 TABLET TWICE A DAY   empagliflozin (JARDIANCE) 10 MG TABS tablet TAKE 1 TABLET DAILY BEFORE BREAKFAST (PLEASE CALL THE OFFICE TO ARRANGE YOUR FOLLOW UP APPOINTMENT)   fish oil-omega-3 fatty acids 1000 MG capsule Take 1 g by mouth daily.   glucose blood (FREESTYLE LITE) test strip USE TO TEST BLOOD SUGAR ONCE DAILY AND AS NEEDED. Dx E11.9   hydrALAZINE (APRESOLINE) 25 MG tablet Take 1 tablet (25 mg total) by mouth daily.   ipratropium (ATROVENT) 0.03 % nasal spray USE 2 SPRAYS IN EACH NOSTRIL TWICE A DAY AS NEEDED FOR RHINITIS   isosorbide mononitrate (IMDUR) 60 MG 24 hr tablet Take 1 tablet (60 mg total) by mouth daily.   LANTUS 100 UNIT/ML injection INJECT 20 TO 25 UNITS AT BEDTIME   magnesium oxide (MAG-OX) 400 MG tablet Take 1 tablet (400 mg total) by mouth every other day.   metoprolol tartrate (LOPRESSOR) 50 MG tablet TAKE 1 TABLET(50 MG) BY MOUTH TWICE DAILY   Multiple Vitamin (MULTIVITAMIN WITH MINERALS) TABS Take 1 tablet by mouth daily.   nitroGLYCERIN (NITROSTAT) 0.4 MG SL tablet DISSOLVE 1 TABLET UNDER THE TONGUE EVERY 5 MINUTES AS  NEEDED FOR CHEST PAIN FOR A MAXIMUM OF 3 DOSES   pantoprazole (PROTONIX) 40 MG tablet Take 1 tablet (40 mg total) by mouth 2 (two) times daily for 42 days, THEN 1 tablet (40 mg total) daily.   polyethylene glycol (MIRALAX / GLYCOLAX) 17 g packet Take 17 g by mouth daily.   Potassium Gluconate 595 MG CAPS Take 595 mg by mouth daily.   ranolazine (RANEXA) 500 MG 12 hr tablet TAKE 1 TABLET TWICE A DAY   rosuvastatin (CRESTOR) 10 MG tablet Take 1 tablet (10 mg total) by mouth daily.   torsemide (DEMADEX) 20 MG tablet Take 1 tablet (20 mg total) every morning   valACYclovir (VALTREX) 1000 MG tablet Take by mouth. Used as needed for a cold sore.     No Known Allergies  Social History   Socioeconomic History   Marital status: Widowed    Spouse name: Not on file   Number of children: 2   Years of education: Not on  file   Highest education level: Not on file  Occupational History   Occupation: Retired Surveyor, minerals. Rep. Equities trader: RETIRED  Tobacco Use   Smoking status: Former    Current packs/day: 0.00    Average packs/day: 1 pack/day for 8.0 years (8.0 ttl pk-yrs)    Types: Cigarettes    Start date: 69    Quit date: 1958    Years since quitting: 66.9   Smokeless tobacco: Never   Tobacco comments:    "quit smoking cigarettes in 1958"  Vaping Use   Vaping status: Never Used  Substance and Sexual Activity   Alcohol use: No    Alcohol/week: 0.0 standard drinks of alcohol   Drug use: No   Sexual activity: Yes  Other Topics Concern   Not on file  Social History Narrative   From [place].  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     Lives with girlfriend Jani Gravel.  Widowed 12/2005.   Social Determinants of Health   Financial Resource Strain: Low Risk  (11/01/2022)   Overall Financial Resource Strain (CARDIA)    Difficulty of Paying Living Expenses: Not hard at all  Food Insecurity: No Food Insecurity (08/18/2023)   Hunger Vital Sign     Worried About Running Out of Food in the Last Year: Never true    Ran Out of Food in the Last Year: Never true  Transportation Needs: No Transportation Needs (08/18/2023)   PRAPARE - Administrator, Civil Service (Medical): No    Lack of Transportation (Non-Medical): No  Physical Activity: Inactive (11/01/2022)   Exercise Vital Sign    Days of Exercise per Week: 0 days    Minutes of Exercise per Session: 0 min  Stress: No Stress Concern Present (11/01/2022)   [employer] of Occupational Health - Occupational Stress Questionnaire    Feeling of Stress : Not at all  Social Connections: Moderately Isolated (11/01/2022)   Social Connection and Isolation Panel [NHANES]    Frequency of Communication with Friends and Family: More than three times a week    Frequency of Social Gatherings with Friends and Family: More than three times a week    Attends Religious Services: Never    Database administrator or Organizations: Yes    Attends Banker Meetings: 1 to 4 times per year    Marital Status: Widowed  Intimate Partner Violence: Not At Risk (08/18/2023)   Humiliation, Afraid, Rape, and Kick questionnaire    Fear of Current or Ex-Partner: No    Emotionally Abused: No    Physically Abused: No    Sexually Abused: No     Review of Systems: General: negative for chills, fever, night sweats or weight changes.  Cardiovascular: negative for chest pain, dyspnea on exertion, edema, orthopnea, palpitations, paroxysmal nocturnal dyspnea or shortness of breath Dermatological: negative for rash Respiratory: negative for cough or wheezing Urologic: negative for hematuria Abdominal: negative for nausea, vomiting, diarrhea, bright red blood per rectum, melena, or hematemesis Neurologic: negative for visual changes, syncope, or dizziness All other systems reviewed and are otherwise negative except as noted above.    Blood pressure 130/62, pulse 60, height 5\' 7"  (1.702 m),  weight 166 lb 12.8 oz (75.7 kg), SpO2 97%.  General appearance: alert and no distress Neck: no adenopathy, no JVD, supple, symmetrical, trachea midline, thyroid not enlarged, symmetric, no tenderness/mass/nodules, and bilateral carotid bruits Lungs: clear to auscultation bilaterally Heart:  irregularly irregular rhythm Extremities: Bilateral lower extremity + left and 1+ on the right Pulses: 2+ and symmetric Skin: Skin color, texture, turgor normal. No rashes or lesions Neurologic: Grossly normal  EKG not performed today      ASSESSMENT AND PLAN:   HLD (hyperlipidemia) History of hyperlipidemia.  On rosuvastatin with lipid profile performed 10/27/2022 revealing a 13, LDL 45 and HDL 34.  HTN (hypertension) History of essential hypertension blood pressure measured today at 130/62.  He is on hydralazine and metoprolol.  Carotid artery disease ([name]) History of carotid artery disease status post right carotid endarterectomy ripped remotely.  Dopplers performed 01/09/2023 revealed a widely patent endarterectomy site with moderate left ICA stenosis.  This will be repeated in 1 year.  Hx of CABG History of CAD status post bypass grafting in 1996 with redo by Dr. Tyrone Sage in 2006.  Dr. Katrinka Blazing In July 2015 in setting of unstable angina.  He stented his obtuse marginal vein graft.  I performed cardiac catheterization on him 08/02/2018 revealing a newly occluded RCA vein graft with high-grade first obtuse marginal branch ostial stenosis which I was unable to access because of geometry.  It was elected to treat him medically.  He is on Imdur.  He denies chest pain.  Non-rheumatic mitral regurgitation History of severe mitral regurgitation status post successful edged edge A2/P2 clipping by Dr. Excell Seltzer 02/06/2020.  EF at that time was 35 to 40%.  His most recent 2D echo performed 11/24/2022 revealed an EF of 35 to 40% with moderate pulmonary hypertension and mild mitral regurgitation.  Bilateral lower  extremity edema Torsemide twice daily with 2+ left 1+ right lower extremity pitting edema.  Permanent atrial fibrillation ([name]) Rate controlled on Eliquis oral anticoagulation.     Runell Gess MD FACP,FACC,FAHA, [facility] 10/04/2023 3:16 PM

## 2023-10-04 NOTE — Assessment & Plan Note (Signed)
Torsemide twice daily with 2+ left 1+ right lower extremity pitting edema.

## 2023-10-04 NOTE — Assessment & Plan Note (Signed)
History of hyperlipidemia.  On rosuvastatin with lipid profile performed 10/27/2022 revealing a 13, LDL 45 and HDL 34.

## 2023-10-04 NOTE — Patient Instructions (Signed)
Medication Instructions:  Your physician recommends that you continue on your current medications as directed. Please refer to the Current Medication list given to you today.  *If you need a refill on your cardiac medications before your next appointment, please call your pharmacy*   Testing/Procedures: Your physician has requested that you have a carotid duplex. This test is an ultrasound of the carotid arteries in your neck. It looks at blood flow through these arteries that supply the brain with blood. Allow one hour for this exam. There are no restrictions or special instructions. This will take place at [address] [facility], [address]. **To do in February**  Please note: We ask at that you not bring children with you during ultrasound (echo/ vascular) testing. Due to room size and safety concerns, children are not allowed in the ultrasound rooms during exams. Our front office staff cannot provide observation of children in our lobby area while testing is being conducted. An adult accompanying a patient to their appointment will only be allowed in the ultrasound room at the discretion of the ultrasound technician under special circumstances. We apologize for any inconvenience.    Follow-Up: At [facility], you and your health needs are our priority.  As part of our continuing mission to provide you with exceptional heart care, we have created designated Provider Care Teams.  These Care Teams include your primary Cardiologist (physician) and Advanced Practice Providers (APPs -  Physician Assistants and Nurse Practitioners) who all work together to provide you with the care you need, when you need it.  We recommend signing up for the patient portal called "MyChart".  Sign up information is provided on this After Visit Summary.  MyChart is used to connect with patients for Virtual Visits (Telemedicine).  Patients are able to view lab/test results, encounter notes, upcoming appointments, etc.   Non-urgent messages can be sent to your provider as well.   To learn more about what you can do with MyChart, go to [email or web address].    Your next appointment:   6 month(s)  Provider:   Marjie Skiff, PA-C, Robet Leu, PA-C, Azalee Course, PA-C, or Bernadene Person, NP       Then, Nanetta Batty, MD will plan to see you again in 12 month(s).

## 2023-10-04 NOTE — Assessment & Plan Note (Signed)
Rate controlled on Eliquis oral anticoagulation. 

## 2023-10-04 NOTE — Assessment & Plan Note (Signed)
History of carotid artery disease status post right carotid endarterectomy ripped remotely.  Dopplers performed 01/09/2023 revealed a widely patent endarterectomy site with moderate left ICA stenosis.  This will be repeated in 1 year.

## 2023-10-11 ENCOUNTER — Encounter (HOSPITAL_COMMUNITY): Payer: Medicare Other

## 2023-10-29 ENCOUNTER — Other Ambulatory Visit: Payer: Self-pay | Admitting: Family Medicine

## 2023-10-29 DIAGNOSIS — E1142 Type 2 diabetes mellitus with diabetic polyneuropathy: Secondary | ICD-10-CM

## 2023-10-30 ENCOUNTER — Other Ambulatory Visit (INDEPENDENT_AMBULATORY_CARE_PROVIDER_SITE_OTHER): Payer: Medicare Other

## 2023-10-30 DIAGNOSIS — E1142 Type 2 diabetes mellitus with diabetic polyneuropathy: Secondary | ICD-10-CM | POA: Diagnosis not present

## 2023-10-30 LAB — LIPID PANEL
Cholesterol: 119 mg/dL (ref 0–200)
HDL: 31.5 mg/dL — ABNORMAL LOW (ref >39.00)
LDL Cholesterol: 67 mg/dL (ref 0–99)
NonHDL: 87.11
Total CHOL/HDL Ratio: 4
Triglycerides: 99 mg/dL (ref 0.0–149.0)
VLDL: 19.8 mg/dL (ref 0.0–40.0)

## 2023-10-30 LAB — CBC WITH DIFFERENTIAL/PLATELET
Basophils Absolute: 0 10*3/uL (ref 0.0–0.1)
Basophils Relative: 0.6 % (ref 0.0–3.0)
Eosinophils Absolute: 0.4 10*3/uL (ref 0.0–0.7)
Eosinophils Relative: 6.3 % — ABNORMAL HIGH (ref 0.0–5.0)
HCT: 37.8 % — ABNORMAL LOW (ref 39.0–52.0)
Hemoglobin: 12.1 g/dL — ABNORMAL LOW (ref 13.0–17.0)
Lymphocytes Relative: 22.7 % (ref 12.0–46.0)
Lymphs Abs: 1.4 10*3/uL (ref 0.7–4.0)
MCHC: 32.1 g/dL (ref 30.0–36.0)
MCV: 98.9 fL (ref 78.0–100.0)
Monocytes Absolute: 0.6 10*3/uL (ref 0.1–1.0)
Monocytes Relative: 10.3 % (ref 3.0–12.0)
Neutro Abs: 3.8 10*3/uL (ref 1.4–7.7)
Neutrophils Relative %: 60.1 % (ref 43.0–77.0)
Platelets: 136 10*3/uL — ABNORMAL LOW (ref 150.0–400.0)
RBC: 3.82 Mil/uL — ABNORMAL LOW (ref 4.22–5.81)
RDW: 16.4 % — ABNORMAL HIGH (ref 11.5–15.5)
WBC: 6.3 10*3/uL (ref 4.0–10.5)

## 2023-10-30 LAB — COMPREHENSIVE METABOLIC PANEL
ALT: 10 U/L (ref 0–53)
AST: 15 U/L (ref 0–37)
Albumin: 4 g/dL (ref 3.5–5.2)
Alkaline Phosphatase: 86 U/L (ref 39–117)
BUN: 45 mg/dL — ABNORMAL HIGH (ref 6–23)
CO2: 30 meq/L (ref 19–32)
Calcium: 9.5 mg/dL (ref 8.4–10.5)
Chloride: 102 meq/L (ref 96–112)
Creatinine, Ser: 3.09 mg/dL — ABNORMAL HIGH (ref 0.40–1.50)
GFR: 17.21 mL/min — ABNORMAL LOW (ref >60.00)
Glucose, Bld: 158 mg/dL — ABNORMAL HIGH (ref 70–99)
Potassium: 3.6 meq/L (ref 3.5–5.1)
Sodium: 141 meq/L (ref 135–145)
Total Bilirubin: 0.9 mg/dL (ref 0.2–1.2)
Total Protein: 7 g/dL (ref 6.0–8.3)

## 2023-10-30 LAB — URIC ACID: Uric Acid, Serum: 7.8 mg/dL (ref 4.0–7.8)

## 2023-10-30 LAB — HEMOGLOBIN A1C: Hgb A1c MFr Bld: 7 % — ABNORMAL HIGH (ref 4.6–6.5)

## 2023-10-31 ENCOUNTER — Ambulatory Visit (INDEPENDENT_AMBULATORY_CARE_PROVIDER_SITE_OTHER): Payer: Medicare Other

## 2023-10-31 VITALS — BP 110/66 | Ht 67.0 in | Wt 165.0 lb

## 2023-10-31 DIAGNOSIS — Z0000 Encounter for general adult medical examination without abnormal findings: Secondary | ICD-10-CM

## 2023-10-31 NOTE — Progress Notes (Signed)
Subjective:   [name] is a 89 y.o. male who presents for Medicare Annual/Subsequent preventive examination.  Visit Complete: In person  Patient Medicare AWV questionnaire was completed by the patient on (not done); I have confirmed that all information answered by patient is correct and no changes since this date.  Cardiac Risk Factors include: advanced age (>21men, >71 women);diabetes mellitus;dyslipidemia;hypertension;male gender;sedentary lifestyle    Objective:    Today's Vitals   10/31/23 1406 10/31/23 1408  BP:  110/66  Weight: 165 lb (74.8 kg) 165 lb (74.8 kg)  Height: 5\' 7"  (1.702 m) 5\' 7"  (1.702 m)  PainSc: 2  2    Body mass index is 25.84 kg/m.     10/31/2023    2:24 PM 08/18/2023   10:13 AM 08/17/2023   10:15 PM 11/01/2022   10:55 AM 03/15/2022   10:06 AM 10/28/2021    9:58 AM 07/20/2020   11:06 AM  Advanced Directives  Does Patient Have a Medical Advance Directive? Yes Yes No Yes Yes Yes Yes  Type of Advance Directive Living will;Healthcare Power of [employer] Power of [place];Living will  Healthcare Power of [place];Living will Healthcare Power of [place];Living will Healthcare Power of [place];Living will Healthcare Power of [place];Living will  Does patient want to make changes to medical advance directive?  Yes (Inpatient - patient defers changing a medical advance directive and declines information at this time)   No - Patient declined Yes (MAU/Ambulatory/Procedural Areas - Information given)   Copy of Healthcare Power of Attorney in Chart? No - copy requested No - copy requested  No - copy requested   No - copy requested    Current Medications (verified) Outpatient Encounter Medications as of 10/31/2023  Medication Sig   allopurinol (ZYLOPRIM) 100 MG tablet TAKE ONE-HALF (1/2) TABLET EVERY MONDAY, WEDNESDAY AND FRIDAY   amoxicillin (AMOXIL) 500 MG tablet Take by mouth. Patient takes 4 tablets prior to going to the dentist   aspirin 81 MG  tablet Take 81 mg by mouth daily.   BD INSULIN SYRINGE ULTRAFINE 31G X 5/16" 0.3 ML MISC USE DAILY AS INSTRUCTED   Cholecalciferol (VITAMIN D) 1000 UNITS capsule Take 1,000 Units by mouth daily.    diclofenac Sodium (VOLTAREN) 1 % GEL Apply 2 g topically 4 (four) times daily.   ELIQUIS 2.5 MG TABS tablet TAKE 1 TABLET TWICE A DAY   empagliflozin (JARDIANCE) 10 MG TABS tablet Take 1 tablet (10 mg total) by mouth daily.   fish oil-omega-3 fatty acids 1000 MG capsule Take 1 g by mouth daily.   glucose blood (FREESTYLE LITE) test strip USE TO TEST BLOOD SUGAR ONCE DAILY AND AS NEEDED. Dx E11.9   hydrALAZINE (APRESOLINE) 25 MG tablet Take 1 tablet (25 mg total) by mouth daily.   ipratropium (ATROVENT) 0.03 % nasal spray USE 2 SPRAYS IN EACH NOSTRIL TWICE A DAY AS NEEDED FOR RHINITIS   isosorbide mononitrate (IMDUR) 60 MG 24 hr tablet Take 1 tablet (60 mg total) by mouth daily.   LANTUS 100 UNIT/ML injection INJECT 20 TO 25 UNITS AT BEDTIME   magnesium oxide (MAG-OX) 400 MG tablet Take 1 tablet (400 mg total) by mouth every other day.   metoprolol tartrate (LOPRESSOR) 50 MG tablet TAKE 1 TABLET(50 MG) BY MOUTH TWICE DAILY   Multiple Vitamin (MULTIVITAMIN WITH MINERALS) TABS Take 1 tablet by mouth daily.   nitroGLYCERIN (NITROSTAT) 0.4 MG SL tablet DISSOLVE 1 TABLET UNDER THE TONGUE EVERY 5 MINUTES AS NEEDED FOR CHEST PAIN  FOR A MAXIMUM OF 3 DOSES   pantoprazole (PROTONIX) 40 MG tablet Take 1 tablet (40 mg total) by mouth 2 (two) times daily for 42 days, THEN 1 tablet (40 mg total) daily.   polyethylene glycol (MIRALAX / GLYCOLAX) 17 g packet Take 17 g by mouth daily.   Potassium Gluconate 595 MG CAPS Take 595 mg by mouth daily.   ranolazine (RANEXA) 500 MG 12 hr tablet TAKE 1 TABLET TWICE A DAY   rosuvastatin (CRESTOR) 10 MG tablet Take 1 tablet (10 mg total) by mouth daily.   torsemide (DEMADEX) 20 MG tablet Take 1 tablet (20 mg total) every morning   valACYclovir (VALTREX) 1000 MG tablet Take by  mouth. Used as needed for a cold sore.   No facility-administered encounter medications on file as of 10/31/2023.    Allergies (verified) Patient has no known allergies.   History: Past Medical History:  Diagnosis Date   Age-related macular degeneration, wet, both eyes ([name])    Anemia    Secondary to acute blood loss   Anginal pain ([name])    last chest pain in Feb 2021   Arthritis    "mild in hands, knees, ankles" (11/13/2015)   Atrial fibrillation ([name])    Consideration was given for atrial flutter ablation, but patient developed atrial fibrillation. Cardioversion was done. Dr. Graciela Husbands decided to watch him clinically. November, 2011   Atrial flutter [facility]) 07/2010   September, 2011   Hospital with PNA and cath done.Marland KitchenMarland KitchenCoumadin.  Atrial flutter ablation planned, but  pt. then had atrial fibrillation,/outpatient conversion 09/08/10..NSR..plan to follow..Dr. Graciela Husbands   CAD (coronary artery disease)    Catheterization, September, 2011,  grafts patent from redo CABG,, medical therapy of coronary disease, consideration to proceeding with atrial flutter ablation   Carotid artery disease ([name])    Doppler 09/18/2009 - 49% bilateral stenoses   Carotid artery disease ([name])    49% bilateral, Doppler, November, 2010   CHF (congestive heart failure) ([name])    Chronic kidney disease (CKD), stage III (moderate) ([name])    Diabetic peripheral neuropathy ([name])    feet   Diverticulosis of colon with hemorrhage 2009   several unit diverticular bleed    Erectile dysfunction    Mild   Gout    Hearing loss    wears hearing aids   History of blood transfusion "several"   related to diverticular bleeding   Hyperlipidemia    Hypertension    Mitral regurgitation    Mild, echo, September, 2011   NSTEMI (non-ST elevated myocardial infarction) ([name]) 07/2010   at [facility] with repeat cath, rec medical mgmt    OSA on CPAP    uses CPAP nightly   Personal history of colonic polyps    PNA (pneumonia) 9/11   NSTEMI at  [facility] with repeat cath, rec medical mgmt    RBBB (right bundle branch block)    S/P mitral valve repair 02/06/2020   s/p TEER with a single MitraClip NTW placed on A2/P2   Shoulder pain    "positional; better now" (11/13/2015)   Skin cancer    R lower leg, per derm 2012   Type II diabetes mellitus ([name])    Past Surgical History:  Procedure Laterality Date   CARDIAC CATHETERIZATION  2006   Nuclear..slight lateral ischemia..medical therapy   CARDIAC CATHETERIZATION  08/04/2010   grafts patent from redo CABG...medical Rx and ablate Atrial flutter (LV not injected)    CARDIOVERSION  ~ 2010   CAROTID ENDARTERECTOMY Right 1994  CATARACT EXTRACTION W/ INTRAOCULAR LENS  IMPLANT, BILATERAL Bilateral    COLON RESECTION N/A 01/13/2016   Procedure: EXPLORATORY LAPAROTOMY, LEFT AND SIGMOID COLON REMOVAL;  Surgeon: Axel Filler, MD;  Location: [facility] OR;  Service: General;  Laterality: N/A;  Extended open left hemicolectomy and sigmoidectomy    COLONOSCOPY N/A 11/14/2015   Procedure: COLONOSCOPY;  Surgeon: Ruffin Frederick, MD;  Location: [facility] ENDOSCOPY;  Service: Gastroenterology;  Laterality: N/A;   COLONOSCOPY Left 01/05/2016   Procedure: COLONOSCOPY;  Surgeon: Ruffin Frederick, MD;  Location: [facility] ENDOSCOPY;  Service: Gastroenterology;  Laterality: Left;  no sedation to start, moderate if needed   COLONOSCOPY W/ POLYPECTOMY     CORONARY ARTERY BYPASS GRAFT  1995; 2006   "X 3; X3"   CORONARY ARTERY BYPASS GRAFT  1995, 2006   DOPPLER ECHOCARDIOGRAPHY  08/2008   EF 60%   DOPPLER ECHOCARDIOGRAPHY  08/02/2010   65-70%   DOPPLER ECHOCARDIOGRAPHY  07/2010   MR mild   ESOPHAGOGASTRODUODENOSCOPY N/A 06/19/2014   Procedure: ESOPHAGOGASTRODUODENOSCOPY (EGD);  Surgeon: Beverley Fiedler, MD;  Location: [facility] ENDOSCOPY;  Service: Endoscopy;  Laterality: N/A;   ESOPHAGOGASTRODUODENOSCOPY N/A 11/14/2015   Procedure: ESOPHAGOGASTRODUODENOSCOPY (EGD);  Surgeon: Ruffin Frederick, MD;  Location: [facility] ENDOSCOPY;   Service: Gastroenterology;  Laterality: N/A;   ESOPHAGOGASTRODUODENOSCOPY (EGD) WITH PROPOFOL N/A 08/18/2023   Procedure: ESOPHAGOGASTRODUODENOSCOPY (EGD) WITH PROPOFOL;  Surgeon: Shellia Cleverly, DO;  Location: [facility] ENDOSCOPY;  Service: Gastroenterology;  Laterality: N/A;   FLEXIBLE SIGMOIDOSCOPY N/A 11/16/2015   Procedure: FLEXIBLE SIGMOIDOSCOPY;  Surgeon: Ruffin Frederick, MD;  Location: [facility] ENDOSCOPY;  Service: Gastroenterology;  Laterality: N/A;   LAPAROSCOPIC CHOLECYSTECTOMY  2008   LEFT HEART CATH N/A 06/14/2014   Procedure: LEFT HEART CATH;  Surgeon: Lesleigh Noe, MD;  Location: [facility] CATH LAB;  Service: Cardiovascular;  Laterality: N/A;   LEFT HEART CATH AND CORONARY ANGIOGRAPHY N/A 08/02/2018   Procedure: LEFT HEART CATH AND CORONARY ANGIOGRAPHY;  Surgeon: Runell Gess, MD;  Location: [facility] INVASIVE CV LAB;  Service: Cardiovascular;  Laterality: N/A;   LEFT HEART CATHETERIZATION WITH CORONARY/GRAFT ANGIOGRAM N/A 06/11/2014   Procedure: LEFT HEART CATHETERIZATION WITH Isabel Caprice;  Surgeon: Lesleigh Noe, MD;  Location: [facility] CATH LAB;  Service: Cardiovascular;  Laterality: N/A;   PERCUTANEOUS CORONARY STENT INTERVENTION (PCI-S) N/A 06/13/2014   Procedure: PERCUTANEOUS CORONARY STENT INTERVENTION (PCI-S);  Surgeon: Lesleigh Noe, MD;  Location: [facility] CATH LAB;  Service: Cardiovascular;  Laterality: N/A;   RIGHT HEART CATH N/A 01/06/2020   Procedure: RIGHT HEART CATH;  Surgeon: Dolores Patty, MD;  Location: [facility] INVASIVE CV LAB;  Service: Cardiovascular;  Laterality: N/A;   SKIN CANCER EXCISION Left 10/2015   calf   SKIN CANCER EXCISION Right 2014?   chest   TEE WITHOUT CARDIOVERSION N/A 01/06/2020   Procedure: TRANSESOPHAGEAL ECHOCARDIOGRAM (TEE);  Surgeon: Dolores Patty, MD;  Location[facility] ENDOSCOPY;  Service: Cardiovascular;  Laterality: N/A;   TONSILLECTOMY     TRANSCATHETER MITRAL EDGE TO EDGE REPAIR N/A 02/06/2020   Procedure: MITRAL VALVE REPAIR;   Surgeon: Tonny Bollman, MD;  Location: [facility] INVASIVE CV LAB;  Service: Cardiovascular;  Laterality: N/A;   VASECTOMY     Family History  Problem Relation Age of Onset   Kidney disease Mother        Kidney failure   Stroke Mother    Diabetes Mother    Heart disease Father        MI   Arthritis Sister  Cancer Sister        Throat   Heart disease Sister        MI   Diabetes Sister    Prostate cancer Neg Hx    Colon cancer Neg Hx    Social History   Socioeconomic History   Marital status: Widowed    Spouse name: Not on file   Number of children: 2   Years of education: Not on file   Highest education level: Not on file  Occupational History   Occupation: Retired Surveyor, minerals. Rep. Equities trader: RETIRED  Tobacco Use   Smoking status: Former    Current packs/day: 0.00    Average packs/day: 1 pack/day for 8.0 years (8.0 ttl pk-yrs)    Types: Cigarettes    Start date: 27    Quit date: 1958    Years since quitting: 67.0   Smokeless tobacco: Never   Tobacco comments:    "quit smoking cigarettes in 1958"  Vaping Use   Vaping status: Never Used  Substance and Sexual Activity   Alcohol use: No    Alcohol/week: 0.0 standard drinks of alcohol   Drug use: No   Sexual activity: Yes  Other Topics Concern   Not on file  Social History Narrative   From [place].  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     Lives with girlfriend Jani Gravel.  Widowed 12/2005.   Social Drivers of Corporate investment banker Strain: Low Risk  (10/31/2023)   Overall Financial Resource Strain (CARDIA)    Difficulty of Paying Living Expenses: Not hard at all  Food Insecurity: No Food Insecurity (10/31/2023)   Hunger Vital Sign    Worried About Running Out of Food in the Last Year: Never true    Ran Out of Food in the Last Year: Never true  Transportation Needs: No Transportation Needs (10/31/2023)   PRAPARE - Administrator, Civil Service  (Medical): No    Lack of Transportation (Non-Medical): No  Physical Activity: Inactive (10/31/2023)   Exercise Vital Sign    Days of Exercise per Week: 0 days    Minutes of Exercise per Session: 0 min  Stress: No Stress Concern Present (10/31/2023)   [employer] of Occupational Health - Occupational Stress Questionnaire    Feeling of Stress : Not at all  Social Connections: Moderately Isolated (10/31/2023)   Social Connection and Isolation Panel [NHANES]    Frequency of Communication with Friends and Family: More than three times a week    Frequency of Social Gatherings with Friends and Family: More than three times a week    Attends Religious Services: Never    Database administrator or Organizations: Yes    Attends Banker Meetings: 1 to 4 times per year    Marital Status: Widowed    Tobacco Counseling Counseling given: Not Answered Tobacco comments: "quit smoking cigarettes in 1958"   Clinical Intake:  Pre-visit preparation completed: Yes  Pain : 0-10 Pain Score: 2  Pain Type: Chronic pain Pain Location: Generalized Pain Descriptors / Indicators: Aching Pain Onset: More than a month ago Pain Frequency: Constant Pain Relieving Factors: moving around helps  Pain Relieving Factors: moving around helps  BMI - recorded: 25.84 Nutritional Status: BMI 25 -29 Overweight Nutritional Risks: None Diabetes: Yes CBG done?: Yes (BS 119 this am at home) CBG resulted in Enter/ Edit results?: No Did pt. bring in CBG monitor  from home?: No  How often do you need to have someone help you when you read instructions, pamphlets, or other written materials from your doctor or pharmacy?: 1 - Never  Interpreter Needed?: No  Comments: lives alone Information entered by :: B.Andi Mahaffy,LPN   Activities of Daily Living    10/31/2023    2:25 PM 08/18/2023   10:13 AM  In your present state of health, do you have any difficulty performing the following activities:   Hearing? 1 1  Vision? 1 1  Difficulty concentrating or making decisions? 1 0  Walking or climbing stairs? 1   Dressing or bathing? 0   Doing errands, shopping? 1 1  Comment does not drive   Preparing Food and eating ? N   Using the Toilet? N   In the past six months, have you accidently leaked urine? N   Do you have problems with loss of bowel control? N   Managing your Medications? N   Managing your Finances? N   Housekeeping or managing your Housekeeping? N     Patient Care Team: Joaquim Nam, MD as PCP - General Allyson Sabal Delton See, MD as PCP - Cardiology (Cardiology) Ronne Binning, MD as Referring Physician (Nephrology) Runell Gess, MD as Consulting Physician (Cardiology) Antonietta Barcelona, OD as Referring Physician (Optometry) Nita Sells, MD as Consulting Physician (Dermatology) Otho Ket, RN as [facility], Garry Heater, [facility] (Inactive) as Pharmacist (Pharmacist)  Indicate any recent Medical Services you may have received from other than [facility] providers in the past year (date may be approximate).     Assessment:   This is a routine wellness examination for Mccormick.  Hearing/Vision screen Hearing Screening - Comments:: Pt says he has poor hearing:he can hear but does not understand: has two hearing aids Vision Screening - Comments:: Pt says his vision is poor:uses magnifying glass Does not drive anymore:has macular degeneration Dr Blima Ledger   Goals Addressed             This Visit's Progress    DIET - REDUCE SUGAR INTAKE   On track    06/21/2019, wants to cut back on sweets     Patient Stated   On track    Starting 05/08/2018, I will continue to take medications as prescribed.      Patient Stated   On track    Would like to cut back on sugar intake     Patient Stated   On track    Get weight back to 160 lbs        Depression Screen    10/31/2023    2:21 PM 08/25/2023    2:27 PM 04/13/2023    12:35 PM 11/01/2022   10:53 AM 03/15/2022   10:15 AM 10/28/2021   10:06 AM 10/29/2020   12:33 PM  PHQ 2/9 Scores  PHQ - 2 Score 0 0 1 0 0 0 0  PHQ- 9 Score  3 2        Fall Risk    10/31/2023    2:14 PM 08/25/2023    2:27 PM 04/13/2023   12:35 PM 11/01/2022   10:56 AM 03/15/2022   10:13 AM  Fall Risk   Falls in the past year? 0 1 0 1 1  Number falls in past yr: 0 0 0 1 1  Injury with Fall? 0 1 0 1 1  Comment    leg needed 7 stitches   Risk for  fall due to : Impaired balance/gait;Impaired mobility History of fall(s) No Fall Risks Impaired mobility Other (Comment)  Risk for fall due to: Comment     patient states fall from "missed footing"  Follow up Education provided;Falls prevention discussed Falls evaluation completed Falls evaluation completed Falls prevention discussed Falls prevention discussed;Education provided    MEDICARE RISK AT HOME: Medicare Risk at Home Any stairs in or around the home?: Yes If so, are there any without handrails?: Yes Home free of loose throw rugs in walkways, pet beds, electrical cords, etc?: Yes Adequate lighting in your home to reduce risk of falls?: Yes Life alert?: No Use of a cane, walker or w/c?: Yes (cane) Grab bars in the bathroom?: Yes Shower chair or bench in shower?: No Elevated toilet seat or a handicapped toilet?: Yes  TIMED UP AND GO:  Was the test performed?  Yes  Length of time to ambulate 10 feet: 18 sec Gait slow and steady without use of assistive device    Cognitive Function:    06/21/2019   10:43 AM 05/09/2018   11:32 AM 05/08/2017    2:45 PM 03/07/2016    3:20 PM  MMSE - Mini Mental State Exam  Orientation to time 5 5 5 5   Orientation to Place 5 5 5 5   Registration 3 3 3 3   Attention/ Calculation 5 0 0 0  Recall 2 3 3 3   Recall-comments missed one     Language- name 2 objects 0 0 0 0  Language- repeat 1 1 1 1   Language- follow 3 step command 0 3 3 3   Language- read & follow direction 0 0 0 0  Write a sentence 0  0 0 0  Copy design 0 0 0 0  Total score 21 20 20 20         10/31/2023    2:28 PM 11/01/2022   10:58 AM  6CIT Screen  What Year? 0 points 0 points  What month? 0 points 0 points  What time? 0 points 0 points  Count back from 20 0 points 0 points  Months in reverse 0 points 0 points  Repeat phrase 0 points 0 points  Total Score 0 points 0 points    Immunizations Immunization History  Administered Date(s) Administered   Fluad Trivalent(High Dose 65+) 08/19/2023   Influenza Split 09/05/2011, 09/24/2012, 07/17/2014   Influenza Whole 09/14/2006, 09/12/2007   Influenza,inj,Quad PF,6+ Mos 09/19/2013, 08/31/2015, 07/07/2016   Influenza-Unspecified 09/29/2020, 08/04/2021, 08/06/2022   PFIZER(Purple Top)SARS-COV-2 Vaccination 12/20/2019, 01/10/2020, 09/22/2020, 08/06/2022   Pfizer Covid-19 Vaccine Bivalent Booster 107yrs & up 08/10/2021   Pneumococcal Conjugate-13 04/28/2015   Pneumococcal Polysaccharide-23 09/14/2002   Td 12/10/1998, 03/16/2009, 06/25/2019   Zoster, Live 10/04/2007    TDAP status: Up to date  Flu Vaccine status: Up to date  Pneumococcal vaccine status: Up to date  Covid-19 vaccine status: Completed vaccines  Qualifies for Shingles Vaccine? Yes   Zostavax completed No   Shingrix Completed?: No.    Education has been provided regarding the importance of this vaccine. Patient has been advised to call insurance company to determine out of pocket expense if they have not yet received this vaccine. Advised may also receive vaccine at local pharmacy or Health Dept. Verbalized acceptance and understanding.  Screening Tests Health Maintenance  Topic Date Due   COVID-19 Vaccine (6 - 2024-25 season) 11/16/2023 (Originally 07/16/2023)   Zoster Vaccines- Shingrix (1 of 2) 01/29/2024 (Originally 02/28/1951)   OPHTHALMOLOGY EXAM  02/20/2024 (Originally 04/14/2019)  FOOT EXAM  04/12/2024   HEMOGLOBIN A1C  04/29/2024   Medicare Annual Wellness (AWV)  10/30/2024    DTaP/Tdap/Td (4 - Tdap) 06/24/2029   Pneumonia Vaccine 36+ Years old  Completed   INFLUENZA VACCINE  Completed   HPV VACCINES  Aged Out    Health Maintenance  There are no preventive care reminders to display for this patient.   Colorectal cancer screening: No longer required.   Lung Cancer Screening: (Low Dose CT Chest recommended if Age 63-80 years, 20 pack-year currently smoking OR have quit w/in 15years.) does not qualify.   Lung Cancer Screening Referral: no  Additional Screening:  Hepatitis C Screening: does not qualify; Completed no  Vision Screening: Recommended annual ophthalmology exams for early detection of glaucoma and other disorders of the eye. Is the patient up to date with their annual eye exam?  Yes  Who is the provider or what is the name of the office in which the patient attends annual eye exams? Dr Blima Ledger If pt is not established with a provider, would they like to be referred to a provider to establish care? No .   Dental Screening: Recommended annual dental exams for proper oral hygiene  Diabetic Foot Exam: Diabetic Foot Exam: Completed 08/18/23  Community Resource Referral / Chronic Care Management: CRR required this visit?  No   CCM required this visit?  No    Plan:     I have personally reviewed and noted the following in the patient's chart:   Medical and social history Use of alcohol, tobacco or illicit drugs  Current medications and supplements including opioid prescriptions. Patient is not currently taking opioid prescriptions. Functional ability and status Nutritional status Physical activity Advanced directives List of other physicians Hospitalizations, surgeries, and ER visits in previous 12 months Vitals Screenings to include cognitive, depression, and falls Referrals and appointments  In addition, I have reviewed and discussed with patient certain preventive protocols, quality metrics, and best practice recommendations. A  written personalized care plan for preventive services as well as general preventive health recommendations were provided to patient.    Sue Lush, LPN   [id number]   After Visit Summary: (In Person-Printed) AVS printed and given to the patient  Nurse Notes: The patient states they are doing good. He no longer drives as vision is not good and does not hear as well but his children transport him wherever he needs to go and help meets errand needs.He has no concerns or questions at this time.

## 2023-10-31 NOTE — Patient Instructions (Addendum)
[name] , Thank you for taking time to come for your Medicare Wellness Visit. I appreciate your ongoing commitment to your health goals. Please review the following plan we discussed and let me know if I can assist you in the future.   Referrals/Orders/Follow-Ups/Clinician Recommendations: none  This is a list of the screening recommended for you and due dates:  Health Maintenance  Topic Date Due   Zoster (Shingles) Vaccine (1 of 2) 02/28/1951   Eye exam for diabetics  04/14/2019   COVID-19 Vaccine (6 - 2024-25 season) 07/16/2023   Complete foot exam   04/12/2024   Hemoglobin A1C  04/29/2024   Medicare Annual Wellness Visit  10/30/2024   DTaP/Tdap/Td vaccine (4 - Tdap) 06/24/2029   Pneumonia Vaccine  Completed   Flu Shot  Completed   HPV Vaccine  Aged Out    Advanced directives: (Copy Requested) Please bring a copy of your health care power of attorney and living will to the office to be added to your chart at your convenience.  Next Medicare Annual Wellness Visit scheduled for next year: Yes 10/31/2024 @ 2:20pm in person

## 2023-11-06 ENCOUNTER — Encounter: Payer: Medicare Other | Admitting: Family Medicine

## 2023-11-20 ENCOUNTER — Encounter: Payer: Self-pay | Admitting: Family Medicine

## 2023-11-20 ENCOUNTER — Ambulatory Visit: Payer: Medicare Other | Admitting: Family Medicine

## 2023-11-20 VITALS — BP 118/62 | HR 111 | Temp 98.6°F | Ht 66.0 in | Wt 170.2 lb

## 2023-11-20 DIAGNOSIS — Z794 Long term (current) use of insulin: Secondary | ICD-10-CM | POA: Diagnosis not present

## 2023-11-20 DIAGNOSIS — E782 Mixed hyperlipidemia: Secondary | ICD-10-CM

## 2023-11-20 DIAGNOSIS — K439 Ventral hernia without obstruction or gangrene: Secondary | ICD-10-CM

## 2023-11-20 DIAGNOSIS — L989 Disorder of the skin and subcutaneous tissue, unspecified: Secondary | ICD-10-CM

## 2023-11-20 DIAGNOSIS — E119 Type 2 diabetes mellitus without complications: Secondary | ICD-10-CM

## 2023-11-20 DIAGNOSIS — I10 Essential (primary) hypertension: Secondary | ICD-10-CM | POA: Diagnosis not present

## 2023-11-20 DIAGNOSIS — Z0000 Encounter for general adult medical examination without abnormal findings: Secondary | ICD-10-CM

## 2023-11-20 DIAGNOSIS — N184 Chronic kidney disease, stage 4 (severe): Secondary | ICD-10-CM

## 2023-11-20 DIAGNOSIS — M109 Gout, unspecified: Secondary | ICD-10-CM

## 2023-11-20 DIAGNOSIS — M25519 Pain in unspecified shoulder: Secondary | ICD-10-CM

## 2023-11-20 DIAGNOSIS — R0602 Shortness of breath: Secondary | ICD-10-CM

## 2023-11-20 MED ORDER — TORSEMIDE 20 MG PO TABS: 20.0000 mg | ORAL_TABLET | Freq: Every day | ORAL | Status: DC

## 2023-11-20 NOTE — Progress Notes (Signed)
 Diabetes:  Using medications without difficulties: yes Hypoglycemic episodes:no Hyperglycemic episodes:no Feet problems: at baseline, with neuropathy, more bothersome at night.   Blood Sugars averaging: usually 75-110 eye exam within last year: yes  CKD. Followed by outside clinic.  Cr d/w pt, avoids systemic nsaids.  Routine cautions given to patient.  Hypertension:    Using medication without problems or lightheadedness: yes Chest pain with exertion: rare discomfort but not using/needing NTG Edema:mild, less than prior.  Short of breath: with exertion.  See below.    Elevated Cholesterol: Using medications without problems: yes Muscle aches: no new changes.   Diet compliance: d/w pt. . Exercise: d/w pt.    Both children designated if patient were incapacitated.  Tetanus 2020 Flu 2024 PNA prev done.  Shingles prev done.  Covid prev done.  Colon and prostate cancer screening not due.   R shoulder pain with overhead movement and ext rotation, supraspinatus testing positive.  No pain with pendulum swings.  Discussed options.  Declined PT referral at this point.    Lesion on the L ear, present for about 1 month.    H/o gout.  Still on allopurinol .  Labs d/w pt.   Still using compression stockings and taking torsemide  BID usually, since he had some inc in weight.    Longstanding fatigue noted, with exertion.  Gradually got worse.  His walking duration is gradually getting shorter.    PMH and SH reviewed  Meds, vitals, and allergies reviewed.   ROS: Per HPI unless specifically indicated in ROS section   GEN: nad, alert and oriented HEENT: mucous membranes moist, irritated lesion on the L ear, concerning for cutaneous horn or superficial SCC NECK: supple w/o LA CV: IRR PULM: ctab, no inc wob ABD: soft, +bs EXT: 1+ BLE edema SKIN: no acute rash  Diabetic foot exam: Normal inspection No skin breakdown No calluses  intact DP pulses dec sensation to light touch and  monofilament on L foot, intact on R Nails thickened.

## 2023-11-20 NOTE — Patient Instructions (Addendum)
 Please call about seeing Dr. Margo Aye  [address] [place] [address] [phone]  You should get a call about the echo and I'll update Dr. Allyson Sabal.  Take care.  Glad to see you. Plan on recheck in about 6 months, sooner if needed.

## 2023-11-22 ENCOUNTER — Encounter (HOSPITAL_BASED_OUTPATIENT_CLINIC_OR_DEPARTMENT_OTHER): Admitting: Internal Medicine

## 2023-11-22 DIAGNOSIS — M25519 Pain in unspecified shoulder: Secondary | ICD-10-CM | POA: Insufficient documentation

## 2023-11-22 DIAGNOSIS — Z0000 Encounter for general adult medical examination without abnormal findings: Secondary | ICD-10-CM | POA: Insufficient documentation

## 2023-11-22 NOTE — Assessment & Plan Note (Signed)
 Per outside clinic.

## 2023-11-22 NOTE — Assessment & Plan Note (Signed)
 Continue Crestor

## 2023-11-22 NOTE — Assessment & Plan Note (Signed)
 A1c reasonable and sugars been controlled at home.  Continue Jardiance and insulin as is.

## 2023-11-22 NOTE — Assessment & Plan Note (Signed)
 History of, continue allopurinol.  Labs discussed with patient.

## 2023-11-22 NOTE — Assessment & Plan Note (Signed)
 Likely rotator cuff pathology. Discussed options.  Declined PT referral at this point.   He can update me as needed.  Discussed using pendulum swings to maintain range of motion.

## 2023-11-22 NOTE — Assessment & Plan Note (Signed)
 Refer to dermatology

## 2023-11-22 NOTE — Assessment & Plan Note (Signed)
  Both children designated if patient were incapacitated.

## 2023-11-22 NOTE — Assessment & Plan Note (Signed)
 He has been having shortness of breath with exertion.  Recent labs discussed with patient.  Continue torsemide as is, with second dose per day if his weight is increasing.  Will arrange for follow-up echo and I will update Dr. Allyson Sabal with cardiology.

## 2023-11-22 NOTE — Assessment & Plan Note (Signed)
 Both children designated if patient were incapacitated.  Tetanus 2020 Flu 2024 PNA prev done.  Shingles prev done.  Covid prev done.  Colon and prostate cancer screening not due.

## 2023-11-23 ENCOUNTER — Other Ambulatory Visit: Payer: Self-pay | Admitting: Family Medicine

## 2023-12-04 ENCOUNTER — Other Ambulatory Visit: Payer: Self-pay | Admitting: Cardiovascular Disease

## 2023-12-04 DIAGNOSIS — I480 Paroxysmal atrial fibrillation: Secondary | ICD-10-CM

## 2023-12-04 NOTE — Telephone Encounter (Signed)
Prescription refill request for Eliquis received. Indication:afib Last office visit:11/24 Scr:3.09  12/24 Age: 89 Weight:77.2  kg  Prescription refilled

## 2023-12-13 ENCOUNTER — Encounter (HOSPITAL_COMMUNITY): Payer: Self-pay

## 2023-12-13 ENCOUNTER — Ambulatory Visit (HOSPITAL_COMMUNITY): Payer: Medicare Other | Attending: Family Medicine

## 2023-12-13 DIAGNOSIS — R0602 Shortness of breath: Secondary | ICD-10-CM | POA: Insufficient documentation

## 2023-12-13 LAB — ECHOCARDIOGRAM COMPLETE
AR max vel: 0.96 cm2
AV Area VTI: 0.68 cm2
AV Area mean vel: 0.97 cm2
AV Mean grad: 12 mm[Hg]
AV Peak grad: 23.4 mm[Hg]
Ao pk vel: 2.42 m/s
Area-P 1/2: 2.06 cm2
MV VTI: 0.82 cm2
S' Lateral: 3.7 cm

## 2023-12-13 MED ORDER — PERFLUTREN LIPID MICROSPHERE
1.0000 mL | INTRAVENOUS | Status: AC | PRN
  Administered 2023-12-13: 2 mL via INTRAVENOUS

## 2023-12-19 ENCOUNTER — Encounter: Payer: Self-pay | Admitting: Cardiovascular Disease

## 2023-12-19 ENCOUNTER — Ambulatory Visit: Payer: Medicare Other | Attending: Cardiovascular Disease | Admitting: Cardiovascular Disease

## 2023-12-19 VITALS — BP 114/64 | HR 74 | Ht 67.0 in | Wt 170.0 lb

## 2023-12-19 DIAGNOSIS — I4821 Permanent atrial fibrillation: Secondary | ICD-10-CM | POA: Diagnosis not present

## 2023-12-19 DIAGNOSIS — I10 Essential (primary) hypertension: Secondary | ICD-10-CM | POA: Insufficient documentation

## 2023-12-19 DIAGNOSIS — Z951 Presence of aortocoronary bypass graft: Secondary | ICD-10-CM | POA: Diagnosis not present

## 2023-12-19 DIAGNOSIS — I5020 Unspecified systolic (congestive) heart failure: Secondary | ICD-10-CM | POA: Diagnosis not present

## 2023-12-19 DIAGNOSIS — I350 Nonrheumatic aortic (valve) stenosis: Secondary | ICD-10-CM | POA: Diagnosis not present

## 2023-12-19 DIAGNOSIS — I340 Nonrheumatic mitral (valve) insufficiency: Secondary | ICD-10-CM | POA: Insufficient documentation

## 2023-12-19 DIAGNOSIS — E782 Mixed hyperlipidemia: Secondary | ICD-10-CM | POA: Diagnosis not present

## 2023-12-19 DIAGNOSIS — I6523 Occlusion and stenosis of bilateral carotid arteries: Secondary | ICD-10-CM | POA: Diagnosis not present

## 2023-12-19 MED ORDER — ISOSORBIDE MONONITRATE ER 60 MG PO TB24: 90.0000 mg | ORAL_TABLET | Freq: Every day | ORAL | 1 refills | Status: DC

## 2023-12-19 NOTE — Patient Instructions (Signed)
 Medication Instructions:  Your physician has recommended you make the following change in your medication:   -Increase isosorbide  mononitrate (Imdur ) 90mg  once daily.  *If you need a refill on your cardiac medications before your next appointment, please call your pharmacy*   Follow-Up: At [facility], you and your health needs are our priority.  As part of our continuing mission to provide you with exceptional heart care, we have created designated Provider Care Teams.  These Care Teams include your primary Cardiologist (physician) and Advanced Practice Providers (APPs -  Physician Assistants and Nurse Practitioners) who all work together to provide you with the care you need, when you need it.  We recommend signing up for the patient portal called MyChart.  Sign up information is provided on this After Visit Summary.  MyChart is used to connect with patients for Virtual Visits (Telemedicine).  Patients are able to view lab/test results, encounter notes, upcoming appointments, etc.  Non-urgent messages can be sent to your provider as well.   To learn more about what you can do with MyChart, go to [email or web address].    Your next appointment:   3-4 month(s)  Provider:   Dorn Lesches, MD     Other Instructions

## 2023-12-19 NOTE — Assessment & Plan Note (Signed)
Permanent A-fib rate controlled on Eliquis oral anticoagulation which has been dose adjusted.

## 2023-12-19 NOTE — Assessment & Plan Note (Signed)
RV systolic pressure in the low 50 range.

## 2023-12-19 NOTE — Assessment & Plan Note (Signed)
History of heart failure with reduced ejection fraction and EF of 30 to 35% by 2D echo 12/13/2023.  He is fairly symptomatic.  His serum creatinine in the mid 3 range precludes GDMT although he is on a beta-blocker.

## 2023-12-19 NOTE — Assessment & Plan Note (Signed)
 History of carotid artery disease status post right carotid endarterectomy by Dr. Oris in 1994.  His most recent carotid Doppler study performed 01/09/2023 revealed a widely patent endarterectomy site with moderate left ICA stenosis.  He is scheduled to have this rechecked this month.

## 2023-12-19 NOTE — Assessment & Plan Note (Signed)
 History of severe mitral regurgitation status post edged edge A2/P2 mitral clipping by Dr. Wonda 02/06/2020.  His most recent 2D echo performed 12/13/2023 revealed only mild mitral vegetation with moderate pulmonary hypertension and an RV systolic pressure of 54 mmHg.

## 2023-12-19 NOTE — Assessment & Plan Note (Signed)
 History of coronary artery bypass grafting in 1996 with redo in 2006.  I performed a heart catheterization on him 08/02/2018 revealing a newly occluded RCA vein graft with high-grade first obtuse marginal branch stenosis which I was unable to access because of geometry.  He is on Imdur  60 mg a day but has noticed new nitrate responsive angina in the last several weeks.

## 2023-12-19 NOTE — Assessment & Plan Note (Signed)
 Recent 2D echo performed 12/13/2023 revealed moderate to severe low-flow/low gradient aortic stenosis.  He is experiencing increasing dyspnea on exertion and nitro responsive chest pain as well as 2+ bilateral lower extremity edema.  I am referring him back to Dr. Wonda for structural evaluation.

## 2023-12-19 NOTE — Assessment & Plan Note (Signed)
History of essential hypertension her blood pressure measured today at 114/64.  He is on metoprolol, and hydralazine.

## 2023-12-19 NOTE — Assessment & Plan Note (Signed)
History of hyperlipidemia on rosuvastatin with lipid profile performed 10/30/2023 revealing total cholesterol 119, LDL 67 and HDL 31.

## 2023-12-19 NOTE — Progress Notes (Signed)
 12/19/2023 [name]   15-Jul-1932  [id number]  Primary Physician Jeremiah Arlyss RAMAN, MD Primary Cardiologist: Jeremiah JINNY Lesches MD [name], [place], [place]  HPI:  [name] is a 89 y.o.  single Caucasian male  formerly a patient of Dr. Chyrl Silence. I last saw him in the office 10/04/2023. [name] is the father of one daughter and one son , the grandfather to 2 grandchildren.  He is accompanied by his son [name].  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 His past medical history is remarkable for coronary artery bypass grafting in 1996 and again in 2006 by Dr. Jobe. He had remote right carotid endarterectomy by Dr. Oris in 1994. History of hypertension, hyperlipidemia and diabetes. He was hospitalized in late July 2015 because of unstable angina. [name] performed cardiac catheterization revealing a high-grade obtuse marginal branch vein graft stenosis which was stented. Because of recurrent chest pain he was re-intervention relates later and found to have a new hazy lesion just proximal to the previously placed stent which was again intervened on. He was deemed a Plavix  nonresponder and was begun on Brilenta. He also has a remote history of PAF as well as diverticulitis and has had GI bleed on Coumadin . His oral anticoagulation was discontinued.    He was recently admitted on 11/13/15 for a GI bleed. He underwent endoscopy revealing gastritis, colonoscopy revealing bleeding diverticula which was treated with clipping. He had 3 units of packed red blood cell transfusion. He did have chest pain and positive enzymes after his endoscopy within the echo that showed an EF of 50% with a new anterior wall motion and reality. He was thought to have a non-STEMI.  His Plavix  was discontinued. Because of his moderate renal insufficiency and his inability to take antiplatelet therapy he did not undergo repeat cardiac catheterization. He underwent  left-sided hemicolectomy and sigmoidectomy because of chronic GI bleeding. This was uneventful. His hemoglobin has remained stable in the 12 range last checked in April.   Because of his new onset chest pain and dyspnea performed a Myoview  stress test that showed a new inferolateral scar/ischemic abnormality and echo that showed segmental wall motion abnormalities.  Because of this I performed diagnostic coronary arteriography on him 08/02/2018 revealing a newly occluded RCA vein graft with a high-grade first circumflex obtuse marginal branch ostial stenosis which I was unable to access because of geometry.  S   He saw [name] back in the office 03/20/2019 complaining of increasing dyspnea.  Low-dose long-acting nitrates were added.  Hemoglobin was stable at 11.  He continues to complain of increasing dyspnea.  He is in A. fib with a controlled ventricular response on Eliquis .     He had a right heart cath by [name] for severe MR which he was symptomatic from.  He ultimately underwent successful edge-to-edge A2/P2 mitral clipping by [name] 02/06/2020 [name] resulting in reduction of 4+ to 1+ MR. 11/24/2022 revealed a decline in his EF from 50 to 55% down to 35 to 40%.  He did have moderate pulm hypertension which is unchanged.  He had mild MR status post mitral clipping.  He had probably at least moderate aortic stenosis (low gradient) with an aortic valve area measured 0.87 cm.   Recently he has noticed increasing fatigue, dyspnea and chest pain.  He saw [name] and [name] in the office approximately week ago who began him on Imdur .  His chest pain has improved.  It has been 4 years since his last cath although now his serum creatinine is 3.7 making invasive evaluation problematic.  He wishes to continue with medical therapy.   Since I saw him in the office 3 months ago he has developed nitro responsive chest pain as well as increasing shortness of breath is an 2+ lower extremity  edema.  He is on torsemide .  2D echo performed 12/13/2023 revealed an EF of 30 to 35%, only mild MR status post edge-to-edge mitral clipping and low gradient low flow aortic stenosis with a valve area of 0.68 cm.   Current Meds  Medication Sig   allopurinol  (ZYLOPRIM ) 100 MG tablet TAKE ONE-HALF (1/2) TABLET EVERY MONDAY, WEDNESDAY AND FRIDAY   amoxicillin  (AMOXIL ) 500 MG tablet Take by mouth. Patient takes 4 tablets prior to going to the dentist   aspirin  81 MG tablet Take 81 mg by mouth daily.   BD INSULIN  SYRINGE ULTRAFINE 31G X 5/16 0.3 ML MISC USE DAILY AS INSTRUCTED   Cholecalciferol  (VITAMIN D ) 1000 UNITS capsule Take 1,000 Units by mouth daily.    diclofenac  Sodium (VOLTAREN ) 1 % GEL Apply 2 g topically 4 (four) times daily.   ELIQUIS  2.5 MG TABS tablet TAKE 1 TABLET TWICE A DAY   empagliflozin  (JARDIANCE ) 10 MG TABS tablet Take 1 tablet (10 mg total) by mouth daily.   fish oil-omega-3 fatty acids  1000 MG capsule Take 1 g by mouth daily.   glucose blood (FREESTYLE LITE) test strip USE TO TEST BLOOD SUGAR ONCE DAILY AND AS NEEDED   hydrALAZINE  (APRESOLINE ) 25 MG tablet Take 1 tablet (25 mg total) by mouth daily.   ipratropium (ATROVENT ) 0.03 % nasal spray USE 2 SPRAYS IN EACH NOSTRIL TWICE A DAY AS NEEDED FOR RHINITIS   LANTUS  100 UNIT/ML injection INJECT 20 TO 25 UNITS AT BEDTIME   magnesium  oxide (MAG-OX) 400 MG tablet Take 1 tablet (400 mg total) by mouth every other day.   metoprolol  tartrate (LOPRESSOR ) 50 MG tablet TAKE 1 TABLET(50 MG) BY MOUTH TWICE DAILY   Multiple Vitamin (MULTIVITAMIN WITH MINERALS) TABS Take 1 tablet by mouth daily.   nitroGLYCERIN  (NITROSTAT ) 0.4 MG SL tablet DISSOLVE 1 TABLET UNDER THE TONGUE EVERY 5 MINUTES AS NEEDED FOR CHEST PAIN FOR A MAXIMUM OF 3 DOSES   polyethylene glycol (MIRALAX  / GLYCOLAX ) 17 g packet Take 17 g by mouth daily.   Potassium Gluconate 595 MG CAPS Take 595 mg by mouth daily.   ranolazine  (RANEXA ) 500 MG 12 hr tablet TAKE 1 TABLET  TWICE A DAY   rosuvastatin  (CRESTOR ) 10 MG tablet Take 1 tablet (10 mg total) by mouth daily.   torsemide  (DEMADEX ) 20 MG tablet Take 1 tablet (20 mg total) by mouth daily. With second dose per day if weight increases   valACYclovir (VALTREX) 1000 MG tablet Take by mouth. Used as needed for a cold sore.   [DISCONTINUED] isosorbide  mononitrate (IMDUR ) 60 MG 24 hr tablet Take 1 tablet (60 mg total) by mouth daily.     Allergies  Allergen Reactions   Nsaids     CKD    Social History   Socioeconomic History   Marital status: Widowed    Spouse name: Not on file   Number of children: 2   Years of education: Not on file   Highest education level: Not on file  Occupational History   Occupation: Retired Surveyor, Minerals. Rep. Equities Trader: RETIRED  Tobacco Use  Smoking status: Former    Current packs/day: 0.00    Average packs/day: 1 pack/day for 8.0 years (8.0 ttl pk-yrs)    Types: Cigarettes    Start date: 74    Quit date: 59    Years since quitting: 67.1   Smokeless tobacco: Never   Tobacco comments:    quit smoking cigarettes in 1958  Vaping Use   Vaping status: Never Used  Substance and Sexual Activity   Alcohol use: No    Alcohol/week: 0.0 standard drinks of alcohol   Drug use: No   Sexual activity: Yes  Other Topics Concern   Not on file  Social History Narrative   From [place].  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     Lives alone.    Social Drivers of Corporate Investment Banker Strain: Low Risk  (10/31/2023)   Overall Financial Resource Strain (CARDIA)    Difficulty of Paying Living Expenses: Not hard at all  Food Insecurity: No Food Insecurity (10/31/2023)   [name] Vital Sign    Worried About Running Out of Food in the Last Year: Never true    Ran Out of Food in the Last Year: Never true  Transportation Needs: No Transportation Needs (10/31/2023)   PRAPARE - Administrator, Civil Service (Medical): No     Lack of Transportation (Non-Medical): No  Physical Activity: Inactive (10/31/2023)   Exercise Vital Sign    Days of Exercise per Week: 0 days    Minutes of Exercise per Session: 0 min  Stress: No Stress Concern Present (10/31/2023)   [employer] of Occupational Health - Occupational Stress Questionnaire    Feeling of Stress : Not at all  Social Connections: Moderately Isolated (10/31/2023)   Social Connection and Isolation Panel [NHANES]    Frequency of Communication with Friends and Family: More than three times a week    Frequency of Social Gatherings with Friends and Family: More than three times a week    Attends Religious Services: Never    Database Administrator or Organizations: Yes    Attends Banker Meetings: 1 to 4 times per year    Marital Status: Widowed  Intimate Partner Violence: Not At Risk (10/31/2023)   Humiliation, Afraid, Rape, and Kick questionnaire    Fear of Current or Ex-Partner: No    Emotionally Abused: No    Physically Abused: No    Sexually Abused: No     Review of Systems: General: negative for chills, fever, night sweats or weight changes.  Cardiovascular: negative for chest pain, dyspnea on exertion, edema, orthopnea, palpitations, paroxysmal nocturnal dyspnea or shortness of breath Dermatological: negative for rash Respiratory: negative for cough or wheezing Urologic: negative for hematuria Abdominal: negative for nausea, vomiting, diarrhea, bright red blood per rectum, melena, or hematemesis Neurologic: negative for visual changes, syncope, or dizziness All other systems reviewed and are otherwise negative except as noted above.    Blood pressure 114/64, pulse 74, height 5' 7 (1.702 m), weight 170 lb (77.1 kg), SpO2 93%.  General appearance: alert and no distress Neck: no adenopathy, no JVD, supple, symmetrical, trachea midline, thyroid  not enlarged, symmetric, no tenderness/mass/nodules, and bilateral carotid bruits Lungs:  clear to auscultation bilaterally Heart: irregularly irregular rhythm and 2/6 outflow tract murmur consistent with aortic stenosis. Extremities: 2+ pitting bilateral lower extremity edema Pulses: 2+ and symmetric Skin: Skin color, texture, turgor normal. No rashes or lesions Neurologic: Grossly normal  EKG EKG Interpretation Date/Time:  Tuesday December 19 2023 14:34:53 EST Ventricular Rate:  74 PR Interval:    QRS Duration:  188 QT Interval:  474 QTC Calculation: 526 R Axis:   -68  Text Interpretation: Atrial fibrillation Left axis deviation Right bundle branch block Inferior infarct , age undetermined T wave abnormality, consider lateral ischemia When compared with ECG of 18-Aug-2023 05:52, PREVIOUS ECG IS PRESENT Confirmed by [name] [phone]) on 12/19/2023 2:41:18 PM    ASSESSMENT AND PLAN:   HLD (hyperlipidemia) History of hyperlipidemia on rosuvastatin  with lipid profile performed 10/30/2023 revealing total cholesterol 119, LDL 67 and HDL 31.  HTN (hypertension) History of essential hypertension her blood pressure measured today at 114/64.  He is on metoprolol , and hydralazine .  Carotid artery disease ([name]) History of carotid artery disease status post right carotid endarterectomy by Dr. Oris in 1994.  His most recent carotid Doppler study performed 01/09/2023 revealed a widely patent endarterectomy site with moderate left ICA stenosis.  He is scheduled to have this rechecked this month.  Hx of CABG History of coronary artery bypass grafting in 1996 with redo in 2006.  I performed a heart catheterization on him 08/02/2018 revealing a newly occluded RCA vein graft with high-grade first obtuse marginal branch stenosis which I was unable to access because of geometry.  He is on Imdur  60 mg a day but has noticed new nitrate responsive angina in the last several weeks.  Non-rheumatic mitral regurgitation History of severe mitral regurgitation status post edged edge A2/P2 mitral  clipping by [name] 02/06/2020.  His most recent 2D echo performed 12/13/2023 revealed only mild mitral vegetation with moderate pulmonary hypertension and an RV systolic pressure of 54 mmHg.  HFrEF (heart failure with reduced ejection fraction) ([name]) History of heart failure with reduced ejection fraction and EF of 30 to 35% by 2D echo 12/13/2023.  He is fairly symptomatic.  His serum creatinine in the mid 3 range precludes GDMT although he is on a beta-blocker.  Pulmonary hypertension ([name]) RV systolic pressure in the low 50 range.  Permanent atrial fibrillation ([name]) Permanent A-fib rate controlled on Eliquis  oral anticoagulation which has been dose adjusted.  Aortic stenosis Recent 2D echo performed 12/13/2023 revealed moderate to severe low-flow/low gradient aortic stenosis.  He is experiencing increasing dyspnea on exertion and nitro responsive chest pain as well as 2+ bilateral lower extremity edema.  I am referring him back to [name] for structural evaluation.     [name] DOROTHA Court MD FACP,FACC,FAHA, FSCAI 12/19/2023 3:00 PM

## 2023-12-27 ENCOUNTER — Ambulatory Visit (HOSPITAL_COMMUNITY)
Admission: RE | Admit: 2023-12-27 | Discharge: 2023-12-27 | Disposition: A | Discharge status: Home / Self Care | Payer: Medicare Other | Source: Ambulatory Visit | Attending: Cardiovascular Disease | Admitting: Cardiovascular Disease

## 2023-12-27 DIAGNOSIS — E782 Mixed hyperlipidemia: Secondary | ICD-10-CM | POA: Insufficient documentation

## 2023-12-27 DIAGNOSIS — I6523 Occlusion and stenosis of bilateral carotid arteries: Secondary | ICD-10-CM | POA: Insufficient documentation

## 2023-12-28 ENCOUNTER — Other Ambulatory Visit (HOSPITAL_COMMUNITY): Payer: Self-pay

## 2023-12-28 DIAGNOSIS — I6523 Occlusion and stenosis of bilateral carotid arteries: Secondary | ICD-10-CM

## 2023-12-29 DIAGNOSIS — D0439 Carcinoma in situ of skin of other parts of face: Secondary | ICD-10-CM | POA: Diagnosis not present

## 2023-12-29 DIAGNOSIS — D0422 Carcinoma in situ of skin of left ear and external auricular canal: Secondary | ICD-10-CM | POA: Diagnosis not present

## 2024-01-07 NOTE — Progress Notes (Addendum)
 Cardiology Office Note:    Date:  01/08/2024   ID:  Wacey, Zieger 03/24/1932, MRN [id number]  PCP:  Joaquim Nam, MD   [facility] Providers Cardiologist:  Nanetta Batty, MD     Referring MD: Joaquim Nam, MD   Chief Complaint  Patient presents with   Shortness of Breath    History of Present Illness:    [name] is a 89 y.o. male referred for evaluation of aortic stenosis.  He has a complex cardiovascular history outlined below:  Coronary artery disease S/p CABG in 1996 and redo in 2006 S/p PCI to S-OM in 05/2014 (4 x 16 mm DES, 4 x 24 mm DES) Plavix non-responder Myoview 07/12/18: EF 39, inf/inf-lat infarct w minimal peri-infarct ischemia  LHC 08/02/18: RCA ost 100, LAD ost 100, LCx ost 70, OM1 90, 60; S-RCA 100 w L-R collats, S-OM 100, S-D1 patent, L-LAD patent; PCI of LCx not successful - Med Rx  (HFrEF) heart failure with reduced ejection fraction  TTE 02/2020: EF 45-50 TTE 01/2021: EF ~50 TTE 11/24/22: EF 35-40, inf/inf-sept/septal HK, mild to mod LVH, mildly reduced RVSF, severe pulmonary HTN, RVSP 60.7, mod LAE, mild MR, s/p MitrClip, no MS, mod TR, AV calcified w mean 8.2 mmHg, RAP 3, small ASD w L-R shunt Permanent atrial fibrillation - not on OAC because of bleeding Mitral regurgitation  S/p MitraClip 01/2020 - mild residual MR Carotid artery disease S/p R CEA in 1994 Carotid US 01/09/23: R 1-39; L 40-59  Chronic kidney disease Stage 3B Hypertension  Hyperlipidemia  Hx of GI bleed  S/p L sided hemicolectomy and sigmoidectomy OSA   The patient is known to me from previous TEER procedure when he had severe MR associated with his cardiomyopathy. He had good reduction in MR and currently has only 1+ (mild) MR. However, he has developed progressive aortic stenosis, now felt to exhibit severe low-flow low-gradient AS with reduced LVEF (Stage D2). He is here with his daughter today to discuss further evaluation and treatment of aortic stenosis.  The patient reports progressive shortness of breath, exercise intolerance, and fatigue associated with physical exertion. He has occasional chest pain but reports no change in pattern of chest pain. He denies orthopnea or PND and reports mild leg swelling. The patient states that he has slowed down significantly over the past year and attributes this to progressive shortness of breath. States that he 'feels fine' when he is sitting or resting. He is also limited by peripheral neuropathy and has some gait instability with a fall reported a few months ago. Despite these symptoms, he lives alone and remains functionally independent.   Current Medications: Current Meds  Medication Sig   allopurinol (ZYLOPRIM) 100 MG tablet TAKE ONE-HALF (1/2) TABLET EVERY MONDAY, WEDNESDAY AND FRIDAY   amoxicillin (AMOXIL) 500 MG tablet Take by mouth. Patient takes 4 tablets prior to going to the dentist   aspirin 81 MG tablet Take 81 mg by mouth daily.   BD INSULIN SYRINGE ULTRAFINE 31G X 5/16" 0.3 ML MISC USE DAILY AS INSTRUCTED   Cholecalciferol (VITAMIN D) 1000 UNITS capsule Take 1,000 Units by mouth daily.    diclofenac Sodium (VOLTAREN) 1 % GEL Apply 2 g topically 4 (four) times daily.   ELIQUIS 2.5 MG TABS tablet TAKE 1 TABLET TWICE A DAY   empagliflozin (JARDIANCE) 10 MG TABS tablet Take 1 tablet (10 mg total) by mouth daily.   fish oil-omega-3 fatty acids 1000 MG capsule Take 1  g by mouth daily.   glucose blood (FREESTYLE LITE) test strip USE TO TEST BLOOD SUGAR ONCE DAILY AND AS NEEDED   hydrALAZINE (APRESOLINE) 25 MG tablet Take 1 tablet (25 mg total) by mouth daily.   ipratropium (ATROVENT) 0.03 % nasal spray USE 2 SPRAYS IN EACH NOSTRIL TWICE A DAY AS NEEDED FOR RHINITIS   isosorbide mononitrate (IMDUR) 60 MG 24 hr tablet Take 1.5 tablets (90 mg total) by mouth daily.   LANTUS 100 UNIT/ML injection INJECT 20 TO 25 UNITS AT BEDTIME   magnesium oxide (MAG-OX) 400 MG tablet Take 1 tablet (400 mg total) by  mouth every other day.   metoprolol tartrate (LOPRESSOR) 50 MG tablet TAKE 1 TABLET(50 MG) BY MOUTH TWICE DAILY   Multiple Vitamin (MULTIVITAMIN WITH MINERALS) TABS Take 1 tablet by mouth daily.   nitroGLYCERIN (NITROSTAT) 0.4 MG SL tablet DISSOLVE 1 TABLET UNDER THE TONGUE EVERY 5 MINUTES AS NEEDED FOR CHEST PAIN FOR A MAXIMUM OF 3 DOSES   polyethylene glycol (MIRALAX / GLYCOLAX) 17 g packet Take 17 g by mouth daily.   Potassium Gluconate 595 MG CAPS Take 595 mg by mouth daily.   ranolazine (RANEXA) 500 MG 12 hr tablet TAKE 1 TABLET TWICE A DAY   rosuvastatin (CRESTOR) 10 MG tablet Take 1 tablet (10 mg total) by mouth daily.   torsemide (DEMADEX) 20 MG tablet Take 1 tablet (20 mg total) by mouth daily. With second dose per day if weight increases   valACYclovir (VALTREX) 1000 MG tablet Take by mouth. Used as needed for a cold sore.     Allergies:   Nsaids   ROS:   Please see the history of present illness.    Positive for numbness in both lower legs from neuropathy, gait unsteadiness (recently started using a walker).  All other systems reviewed and are negative.  EKGs/Labs/Other Studies Reviewed:    The following studies were reviewed today: Cardiac Studies & Procedures   ______________________________________________________________________________________________ CARDIAC CATHETERIZATION  CARDIAC CATHETERIZATION 02/06/2020  Narrative Successful transcatheter edge-to-edge mitral valve repair using a single MitraClip NTW device placed A2/P2, reducing mitral regurgitation from 4+ to 1+   CARDIAC CATHETERIZATION  CARDIAC CATHETERIZATION 01/06/2020  Narrative Findings:  RA = 10 RV = 58/8 PA = 58/19 (33) PCW = 20 (v waves to 35) Fick cardiac output/index = 4.3/2.1 Thermo CO/CI = 3.2/1.7 PVR = 3.1 (Fick) 4.0 (thermo) Ao sat = 99% PA sat = 64%, 68%  Assessment:  1.  Moderately elevated biventricular pressures with low cardiac output 2.  Prominent v-waves on PCWP tracing  c/w known severe MR  Plan/Discussion:  Will review possible Mitraclip with structural heart team.  Arvilla Meres, MD 11:16 AM   STRESS TESTS  MYOCARDIAL PERFUSION IMAGING 07/12/2018  Narrative  Nuclear stress EF: 39%.  The left ventricular ejection fraction is moderately decreased (30-44%).  There was no ST segment deviation noted during stress.  Findings consistent with prior myocardial infarction.  This is an intermediate risk study.  1. EF 39%, inferior hypokinesis. 2. Fixed medium-sized, severe basal to apical inferior and inferolateral perfusion defect.  This suggests prior infarction with minimal ischemia. 3. Atrial fibrillation noted.  Intermediate risk study due to low EF.   ECHOCARDIOGRAM  ECHOCARDIOGRAM COMPLETE 12/13/2023  Narrative ECHOCARDIOGRAM REPORT    Patient Name:   DIMITRIS SHANAHAN Date of Exam: 12/13/2023 Medical Rec #:  [id number]      Height:       66.0 in Accession #:    [id number]  Weight:       170.2 lb Date of Birth:  1932/08/09      BSA:          1.867 m Patient Age:    89 years       BP:           118/62 mmHg Patient Gender: M              HR:           101 bpm. Exam Location:  [address]  Procedure: 2D Echo, Cardiac Doppler, Color Doppler and Intracardiac Opacification Agent  Indications:    R06.02 SOB  History:        Patient has prior history of Echocardiogram examinations, most recent 11/24/2022. CAD and Previous Myocardial Infarction, Arrythmias:Atrial Fibrillation; Risk Factors:Diabetes, Hypertension and Dyslipidemia.  Mitral Valve: single MitraClip NTW placed on A2/P2 Mitra-Clip valve is present in the mitral position. Procedure Date: 02/06/2020.  Sonographer:    Thurman Coyer RDCS Referring Phys: [id number] Curd DUNCAN  IMPRESSIONS   1. Left ventricular ejection fraction, by estimation, is 30 to 35%. The left ventricle has moderately decreased function. The left ventricle demonstrates global hypokinesis. There  is moderate concentric left ventricular hypertrophy. Left ventricular diastolic function could not be evaluated. 2. Right ventricular systolic function is moderately reduced. The right ventricular size is normal. There is moderately elevated pulmonary artery systolic pressure. The estimated right ventricular systolic pressure is 54.7 mmHg. 3. The mitral valve has been repaired/replaced. There is a single MitraClip NTW placed on A2/P2 Mitra-Clip present in the mitral position. Procedure Date: 02/06/2020. The mitra Clip is well seated. The mean MVG is consistent with mild mitral stenosis. Mild mitral regurgitation is present. 4. The aortic valve is calcified. There is severe calcifcation of the aortic valve. There is severe thickening of the aortic valve. Aortic valve regurgitation is mild. Aortic valve mean gradient measures 12.0 mmHg. Aortic valve Vmax measures 2.42 m/s. low DVI 0.22 and SVI 20. Despite a low AVG and Vmax, all findings suggest at least moderate and likely moderate to severe low flow low gradient aortic stenosis in setting of HFrEF. 5. The inferior vena cava is dilated in size with <50% respiratory variability, suggesting right atrial pressure of 15 mmHg. 6. Ascending aorta measurements are within normal limits for age when indexed to body surface area. 7. Evidence of residual atrial level shunting detected by color flow Doppler from prior transseptal puncture site . 8. Compared to prior study dated 11/24/22, the mean AVG has increased from to , Vmax has icnreased from 1.39m/s to 2.14m/s, DVI has decreased from 0.34 to 0.22. Now at least moderate and likely moderate to severe low flow low gradient Aortic stenosis. Stable Mitra-Clip with minimal increase in mean MVG from 4 to .  FINDINGS Left Ventricle: Left ventricular ejection fraction, by estimation, is 30 to 35%. The left ventricle has moderately decreased function. The left ventricle demonstrates global  hypokinesis. The left ventricular internal cavity size was normal in size. There is moderate concentric left ventricular hypertrophy. Abnormal (paradoxical) septal motion, consistent with left bundle branch block. Left ventricular diastolic function could not be evaluated due to mitral valve repair. Left ventricular diastolic function could not be evaluated.  Right Ventricle: The right ventricular size is normal. No increase in right ventricular wall thickness. Right ventricular systolic function is moderately reduced. There is moderately elevated pulmonary artery systolic pressure. The tricuspid regurgitant velocity is 3.15 m/s, and with an assumed right atrial  pressure of 15 mmHg, the estimated right ventricular systolic pressure is 54.7 mmHg.  Left Atrium: Left atrial size was normal in size.  Right Atrium: Right atrial size was normal in size.  Pericardium: There is no evidence of pericardial effusion.  Mitral Valve: The mitral valve has been repaired/replaced. Moderate mitral annular calcification. Mild mitral valve regurgitation. There is a single MitraClip NTW placed on A2/P2 Mitra-Clip present in the mitral position. Procedure Date: 02/06/2020. Moderate to severe mitral valve stenosis. MV peak gradient, 13.0 mmHg. The mean mitral valve gradient is 5.0 mmHg.  Tricuspid Valve: The tricuspid valve is normal in structure. Tricuspid valve regurgitation is mild . No evidence of tricuspid stenosis.  Aortic Valve: Planimetered AVA is 1cm2. The aortic valve is calcified. There is severe calcifcation of the aortic valve. There is severe thickening of the aortic valve. Aortic valve regurgitation is mild. Moderate aortic stenosis is present. Aortic valve mean gradient measures 12.0 mmHg. Aortic valve peak gradient measures 23.4 mmHg. Aortic valve area, by VTI measures 0.68 cm.  Pulmonic Valve: The pulmonic valve was normal in structure. Pulmonic valve regurgitation is mild. No evidence of pulmonic  stenosis.  Aorta: The aortic root is normal in size and structure. Ascending aorta measurements are within normal limits for age when indexed to body surface area.  Venous: The inferior vena cava is dilated in size with less than 50% respiratory variability, suggesting right atrial pressure of 15 mmHg.  IAS/Shunts: Evidence of atrial level shunting detected by color flow Doppler.   LEFT VENTRICLE PLAX 2D LVIDd:         4.30 cm LVIDs:         3.70 cm LV PW:         1.30 cm LV IVS:        1.60 cm LVOT diam:     2.00 cm LV SV:         37 LV SV Index:   20 LVOT Area:     3.14 cm   RIGHT VENTRICLE RV Basal diam:  3.60 cm RV Mid diam:    2.80 cm RV S prime:     6.37 cm/s  LEFT ATRIUM             Index        RIGHT ATRIUM           Index LA diam:        4.40 cm 2.36 cm/m   RA Area:     21.40 cm LA Vol (A2C):   86.9 ml 46.54 ml/m  RA Volume:   52.70 ml  28.22 ml/m LA Vol (A4C):   27.7 ml 14.83 ml/m LA Biplane Vol: 50.7 ml 27.15 ml/m AORTIC VALVE AV Area (Vmax):    0.96 cm AV Area (Vmean):   0.97 cm AV Area (VTI):     0.68 cm AV Vmax:           242.00 cm/s AV Vmean:          160.000 cm/s AV VTI:            0.549 m AV Peak Grad:      23.4 mmHg AV Mean Grad:      12.0 mmHg LVOT Vmax:         73.57 cm/s LVOT Vmean:        49.433 cm/s LVOT VTI:          0.119 m LVOT/AV VTI ratio: 0.22  AORTA Ao Root diam: 3.60 cm Ao  Asc diam:  3.90 cm  MITRAL VALVE              TRICUSPID VALVE MV Area (PHT): 2.06 cm   TR Peak grad:   39.7 mmHg MV Area VTI:   0.82 cm   TR Vmax:        315.00 cm/s MV Peak grad:  13.0 mmHg MV Mean grad:  5.0 mmHg   SHUNTS MV Vmax:       1.80 m/s   Systemic VTI:  0.12 m MV Vmean:      98.8 cm/s  Systemic Diam: 2.00 cm  Armanda Magic MD Electronically signed by Armanda Magic MD Signature Date/Time: 12/13/2023/12:58:06 PM    Final   TEE  ECHO TEE 02/06/2020  Narrative TRANSESOPHOGEAL ECHO REPORT    Patient Name:   SHAHIEM BEDWELL Date of  Exam: 02/06/2020 Medical Rec #:  [id number]      Height:       67.0 in Accession #:    [id number]     Weight:       188.0 lb Date of Birth:  April 14, 1932      BSA:          1.970 m Patient Age:    87 years       BP:           142/56 mmHg Patient Gender: M              HR:           54 bpm. Exam Location:  Inpatient  Procedure: Transesophageal Echo, Cardiac Doppler, Color Doppler and 3D Echo  Indications:     Mitral valve insufficiency 424.0 / 134.0  History:         Patient has prior history of Echocardiogram examinations, most recent 01/10/2020. CHF, Acute MI and CAD, Prior CABG, Pulmonary HTN, Mitral Valve Disease, Arrythmias:RBBB, Signs/Symptoms:Murmur; Risk Factors:Sleep Apnea, Hypertension, Dyslipidemia, Diabetes and Former Smoker. Carotid artery disease.  Mitral Valve: MitraClip NTW valve is present in the mitral position. Procedure Date: 02/06/2020.  Sonographer:     Leta Jungling RDCS Referring Phys:  [id number] Artie Mcintyre Diagnosing Phys: Tobias [name] MD  PROCEDURE: The transesophogeal probe was passed without difficulty through the esophogus of the patient. Sedation performed by different physician. The patient's vital signs; including heart rate, blood pressure, and oxygen saturation; remained stable throughout the procedure. The patient developed no complications during the procedure.  IMPRESSIONS   1. Primary mitral regurgitation with degenerative leaflets and 4+ MR, with 1 major jet directed centro-posteriorly. One NTW MitraClip was placed in between A1/2 and P1/2 with improvement of mitral regurgitation to mild to moderate. Mean transmitral gradient increased minimally from 2-> 3 mmHg. There was residual iatrogenic ASD at the fossa ovalis with left to right flow only. LVEF has decreased to approximately 40%. 2. Left ventricular ejection fraction, by estimation, is 45 to 50%. The left ventricle has mildly decreased function. The left ventricle has no regional wall motion  abnormalities. 3. Right ventricular systolic function is mildly reduced. The right ventricular size is normal. 4. Left atrial size was severely dilated. No left atrial/left atrial appendage thrombus was detected. 5. Right atrial size was moderately dilated. 6. The mitral valve is degenerative. Severe mitral valve regurgitation. No evidence of mitral stenosis. The mean mitral valve gradient is 2.0 mmHg. There is a MitraClip NTW present in the mitral position. Procedure Date: 02/06/2020. 7. Tricuspid valve regurgitation is moderate. 8. The aortic valve is normal in structure. Aortic  valve regurgitation is mild. No aortic stenosis is present. 9. The inferior vena cava is normal in size with greater than 50% respiratory variability, suggesting right atrial pressure of 3 mmHg.  Conclusion(s)/Recommendation(s): Normal biventricular function without evidence of hemodynamically significant valvular heart disease.  FINDINGS Left Ventricle: Left ventricular ejection fraction, by estimation, is 45 to 50%. The left ventricle has mildly decreased function. The left ventricle has no regional wall motion abnormalities. The left ventricular internal cavity size was normal in size. There is no left ventricular hypertrophy.  Right Ventricle: The right ventricular size is normal. No increase in right ventricular wall thickness. Right ventricular systolic function is mildly reduced.  Left Atrium: Left atrial size was severely dilated. No left atrial/left atrial appendage thrombus was detected.  Right Atrium: Right atrial size was moderately dilated.  Pericardium: There is no evidence of pericardial effusion.  Mitral Valve: The mitral valve is degenerative in appearance. There is moderate thickening of the mitral valve leaflet(s). There is moderate calcification of the mitral valve leaflet(s). Normal mobility of the mitral valve leaflets. Mild mitral annular calcification. Severe mitral valve regurgitation. There  is a MitraClip NTW present in the mitral position. Procedure Date: 02/06/2020. No evidence of mitral valve stenosis. MV peak gradient, 6.2 mmHg. The mean mitral valve gradient is 2.0 mmHg.  Tricuspid Valve: The tricuspid valve is normal in structure. Tricuspid valve regurgitation is moderate . No evidence of tricuspid stenosis.  Aortic Valve: The aortic valve is normal in structure.. There is mild thickening and mild calcification of the aortic valve. Aortic valve regurgitation is mild. No aortic stenosis is present. There is mild thickening of the aortic valve. There is mild calcification of the aortic valve.  Pulmonic Valve: The pulmonic valve was normal in structure. Pulmonic valve regurgitation is not visualized. No evidence of pulmonic stenosis.  Aorta: The aortic root is normal in size and structure.  Venous: The inferior vena cava is normal in size with greater than 50% respiratory variability, suggesting right atrial pressure of 3 mmHg.  IAS/Shunts: No atrial level shunt detected by color flow Doppler.   MITRAL VALVE MV Area (PHT): 2.75 cm MV Peak grad:  6.2 mmHg MV Mean grad:  2.0 mmHg MV Vmax:       1.24 m/s MV Vmean:      60.5 cm/s MR Peak grad:    124.1 mmHg MR Mean grad:    80.0 mmHg MR Vmax:         557.00 cm/s MR Vmean:        422.5 cm/s MR PISA:         3.08 cm MR PISA Eff ROA: 20 mm MR PISA Radius:  0.70 cm  Tobias [name] MD Electronically signed by Tobias [name] MD Signature Date/Time: 02/06/2020/2:04:47 PM    Final        ______________________________________________________________________________________________      EKG:        Recent Labs: 08/18/2023: Magnesium 2.3 10/30/2023: ALT 10; BUN 45; Creatinine, Ser 3.09; Hemoglobin 12.1; Platelets 136.0; Potassium 3.6; Sodium 141  Recent Lipid Panel    Component Value Date/Time   CHOL 119 10/30/2023 0907   TRIG 99.0 10/30/2023 0907   HDL 31.50 (L) 10/30/2023 0907   CHOLHDL 4 10/30/2023  0907   VLDL 19.8 10/30/2023 0907   LDLCALC 67 10/30/2023 0907         Physical Exam:    VS:  BP 138/80   Pulse 67   Ht 5\' 7"  (1.702 m)   Wt  166 lb (75.3 kg)   SpO2 94%   BMI 26.00 kg/m     Wt Readings from Last 3 Encounters:  01/08/24 166 lb (75.3 kg)  12/19/23 170 lb (77.1 kg)  11/20/23 170 lb 3.2 oz (77.2 kg)     GEN:  Well nourished, well developed in no acute distress HEENT: Normal NECK: No JVD; No carotid bruits LYMPHATICS: No lymphadenopathy CARDIAC: RRR, 2/6 systolic murmur at the RUSB RESPIRATORY:  Clear to auscultation without rales, wheezing or rhonchi  ABDOMEN: Soft, non-tender, non-distended MUSCULOSKELETAL:  1+ bilateral pretibial and ankle edema; No deformity  SKIN: Warm and dry NEUROLOGIC:  Alert and oriented x 3 PSYCHIATRIC:  Normal affect   Assessment & Plan Nonrheumatic aortic valve stenosis The patient's echo images are reviewed. He has a calcified and fixed NCC with thickening and restricted mobility of the left and right cusps. Doppler data shows only a mean gradient of 12 mmHg but stroke volume index and dimensionless index (LVOT/Ao valve) suggest severe stenosis. He clearly has progressive symptoms which could be multifactorial but certainly could be attributed to worsening aortic stenosis. I reviewed potential treatment options with the patient and his daughter today. We discussed the natural history of severe aortic stenosis and it's progressive nature, ultimately leading to worsening heart failure and death within 1-2 years in many patients. He understands that TAVR might be a potential treatment option, but is complicated in the setting of his Stage 4 CKD (most recent creatinine 3.09). If we proceed with TAVR, he will require a CTA of the heart and the chest/abd/pelvis (approx 80 cc IV contrast). He also will require R/L heart catheterization with coronary angiography (typically less than 50 cc contrast). These studies would be spread out to reduce his  risk of AKI and contrast nephropathy. Once his preop studies are completed, he would be referred for formal surgical consultation as part of a multidisciplinary approach to his care. The patient is scheduled to see his nephrologist, Dr Allena Katz, later this week. I advised him that I will touch base with Dr Allena Katz to discuss his management and will review as a team with his primary cardiologist and PCP as well. If he ultimately opts for conservative therapy, a palliative approach to his care would be appropriate at his advanced age. However, if he opts for TAVR, I think we would have to be prepare to deal with progressive kidney injury which would be problematic at his advanced age. I will contact the patient after we discuss his case and will decide on whether to proceed with cardiac cath and CTA studies at that time. Non-rheumatic mitral regurgitation Mild residual MR noted after TEER with MitraClip in 2021. I reviewed his recent echo and he appears to have no more than 1-2+ MR.        Informed Consent   Shared Decision Making/Informed Consent The risks [stroke (1 in 1000), death (1 in 1000), kidney failure [usually temporary] (1 in 500), bleeding (1 in 200), allergic reaction [possibly serious] (1 in 200)], benefits (diagnostic support and management of coronary artery disease) and alternatives of a cardiac catheterization were discussed in detail with Mr. Tissue and he is willing to proceed.       Medication Adjustments/Labs and Tests Ordered: Current medicines are reviewed at length with the patient today.  Concerns regarding medicines are outlined above.  No orders of the defined types were placed in this encounter.  No orders of the defined types were placed in this encounter.   Patient  Instructions  Follow-Up: At [facility], you and your health needs are our priority.  As part of our continuing mission to provide you with exceptional heart care, we have created designated Provider  Care Teams.  These Care Teams include your primary Cardiologist (physician) and Advanced Practice Providers (APPs -  Physician Assistants and Nurse Practitioners) who all work together to provide you with the care you need, when you need it.  We recommend signing up for the patient portal called "MyChart".  Sign up information is provided on this After Visit Summary.  MyChart is used to connect with patients for Virtual Visits (Telemedicine).  Patients are able to view lab/test results, encounter notes, upcoming appointments, etc.  Non-urgent messages can be sent to your provider as well.   To learn more about what you can do with MyChart, go to [email or web address].    Your next appointment:   Structural Team will follow-up  Provider:   Tonny Bollman, MD     Signed, Tonny Bollman, MD  01/08/2024 7:13 PM    [facility]  ADDENDUM: I personally spoke with Dr Allena Katz, Mr Kanan's nephrologist, about his treatment plan. He has recently seen him in the office as well. He provides renal clearance to proceed with CTA studies, cardiac cath, and TAVR as detailed above. We discussed that we will spread out his studies, avoid nephrotoxins, and limit contrast as much as possible in order to minimize his risk of AKI/contrast nephropathy.   Tonny Bollman 01/27/2024 7:10 AM

## 2024-01-07 NOTE — Assessment & Plan Note (Signed)
 The patient's echo images are reviewed. He has a calcified and fixed NCC with thickening and restricted mobility of the left and right cusps. Doppler data shows only a mean gradient of 12 mmHg but stroke volume index and dimensionless index (LVOT/Ao valve) suggest severe stenosis. He clearly has progressive symptoms which could be multifactorial but certainly could be attributed to worsening aortic stenosis. I reviewed potential treatment options with the patient and his daughter today. We discussed the natural history of severe aortic stenosis and it's progressive nature, ultimately leading to worsening heart failure and death within 1-2 years in many patients. He understands that TAVR might be a potential treatment option, but is complicated in the setting of his Stage 4 CKD (most recent creatinine 3.09). If we proceed with TAVR, he will require a CTA of the heart and the chest/abd/pelvis (approx 80 cc IV contrast). He also will require R/L heart catheterization with coronary angiography (typically less than 50 cc contrast). These studies would be spread out to reduce his risk of AKI and contrast nephropathy. Once his preop studies are completed, he would be referred for formal surgical consultation as part of a multidisciplinary approach to his care. The patient is scheduled to see his nephrologist, Dr Allena Katz, later this week. I advised him that I will touch base with Dr Allena Katz to discuss his management and will review as a team with his primary cardiologist and PCP as well. If he ultimately opts for conservative therapy, a palliative approach to his care would be appropriate at his advanced age. However, if he opts for TAVR, I think we would have to be prepare to deal with progressive kidney injury which would be problematic at his advanced age. I will contact the patient after we discuss his case and will decide on whether to proceed with cardiac cath and CTA studies at that time.

## 2024-01-08 ENCOUNTER — Encounter: Payer: Self-pay | Admitting: Cardiovascular Disease

## 2024-01-08 ENCOUNTER — Ambulatory Visit: Payer: Medicare Other | Attending: Cardiovascular Disease | Admitting: Cardiovascular Disease

## 2024-01-08 VITALS — BP 138/80 | HR 67 | Ht 67.0 in | Wt 166.0 lb

## 2024-01-08 DIAGNOSIS — I340 Nonrheumatic mitral (valve) insufficiency: Secondary | ICD-10-CM | POA: Diagnosis not present

## 2024-01-08 DIAGNOSIS — I350 Nonrheumatic aortic (valve) stenosis: Secondary | ICD-10-CM | POA: Insufficient documentation

## 2024-01-08 NOTE — Assessment & Plan Note (Signed)
 Mild residual MR noted after TEER with MitraClip in 2021. I reviewed his recent echo and he appears to have no more than 1-2+ MR.

## 2024-01-08 NOTE — Patient Instructions (Signed)
  Follow-Up: At [facility], you and your health needs are our priority.  As part of our continuing mission to provide you with exceptional heart care, we have created designated Provider Care Teams.  These Care Teams include your primary Cardiologist (physician) and Advanced Practice Providers (APPs -  Physician Assistants and Nurse Practitioners) who all work together to provide you with the care you need, when you need it.  We recommend signing up for the patient portal called "MyChart".  Sign up information is provided on this After Visit Summary.  MyChart is used to connect with patients for Virtual Visits (Telemedicine).  Patients are able to view lab/test results, encounter notes, upcoming appointments, etc.  Non-urgent messages can be sent to your provider as well.   To learn more about what you can do with MyChart, go to [email or web address].    Your next appointment:   Structural Team will follow-up  Provider:   [name] Cooper,MD

## 2024-01-09 ENCOUNTER — Ambulatory Visit (INDEPENDENT_AMBULATORY_CARE_PROVIDER_SITE_OTHER): Payer: Medicare Other | Admitting: General Practice

## 2024-01-09 ENCOUNTER — Encounter: Payer: Self-pay | Admitting: General Practice

## 2024-01-09 VITALS — BP 130/64 | HR 58 | Temp 97.6°F | Wt 165.0 lb

## 2024-01-09 DIAGNOSIS — S81811A Laceration without foreign body, right lower leg, initial encounter: Secondary | ICD-10-CM | POA: Diagnosis not present

## 2024-01-09 MED ORDER — DOXYCYCLINE HYCLATE 100 MG PO TABS: 100.0000 mg | ORAL_TABLET | Freq: Two times a day (BID) | ORAL | 0 refills | Status: AC

## 2024-01-09 NOTE — Assessment & Plan Note (Addendum)
 New wound.  Hot to touch.  No bleeding today.  Vitals stable.  Given the presentation and risk for infection, will treat with doxycyline for 7 days.  Given his medication hx and he is on eliquis and he did have some bleeding, will check CBC, BMP (kidney dx).   Discussed ER precautions with patient and daughter. Verbalizes understanding.  Schedule f/u with pcp if not better.   Wound care referral placed.  Consent obtained to apply new dressing. Wound cleansed with soap and water, applied neosporin and applied new Kerlix at the appointment today. Tolerated well.

## 2024-01-09 NOTE — Patient Instructions (Addendum)
 Stop by the lab prior to leaving today. I will notify you of your results once received.   Wash wound with soap and water, apply neosporin and then wrap with gauze.   Start Doxycycline antibiotic for the infection. Take 1 tablet by mouth twice daily for 7 days.  It was nice meeting you.

## 2024-01-09 NOTE — Progress Notes (Signed)
 Established Patient Office Visit  Subjective   Patient ID: [name], male    DOB: 09-Dec-1931  Age: 89 y.o. MRN: [id number]  Chief Complaint  Patient presents with   Laceration    On right leg. Patient was putting on compression stockings and cut his leg with the socks.     Laceration    [name] is a 89 year old male, patient of Dr. Para March, presents today for an acute visit.  His daughter is also present today.  Laceration: Located on right lower leg. Happened 6 days ago. Was putting on compression stockings and cut his leg with the socks. Small amounts of bleeding. Intermittent stabbing pain. No itching. Daughter applied triple antibiotic ointment and hydrogen peroxide and put a dressing on it. Denies fevers, dizziness, chills, nausea, vomiting. Had a previous wound located on the same leg and was treated at the wound clinic in 2023. He has a history of neuropathy in the legs and did not realize how bad his wound had gotten.  Patient Active Problem List   Diagnosis Date Noted   Aortic stenosis 12/19/2023   Healthcare maintenance 11/22/2023   Shoulder pain 11/22/2023   Prolonged QT interval 08/18/2023   Gastritis and gastroduodenitis 08/18/2023   Duodenal ulcer 08/18/2023   Bilateral lower extremity edema 03/01/2023   Skin lesion 11/06/2022   Laceration of right lower extremity 11/25/2021   Rhinitis 11/01/2020   Non-rheumatic mitral regurgitation 02/06/2020   S/P mitral valve repair 02/06/2020   Permanent atrial fibrillation ([name])    CAD (coronary artery disease) 08/02/2018   Macular degeneration 06/24/2018   Abdominal wall hernia 05/24/2017   History of lower GI bleeding    Pulmonary hypertension ([name])    HFrEF (heart failure with reduced ejection fraction) ([name]) 11/20/2015   Gout 09/09/2014   Advance care planning 09/09/2014   Chronic anticoagulation    RBBB (right bundle branch block)    Carotid artery disease ([name])    Hx of CABG    CKD (chronic kidney  disease) stage 4, GFR 15-29 ml/min ([name]) 03/24/2011   Peripheral sensory neuropathy due to type 2 diabetes mellitus ([name])    GERD    Sleep apnea    Insulin dependent type 2 diabetes mellitus ([name])    HLD (hyperlipidemia)    HTN (hypertension)    Past Medical History:  Diagnosis Date   Age-related macular degeneration, wet, both eyes ([name])    Anemia    Secondary to acute blood loss   Anginal pain ([name])    last chest pain in Feb 2021   Arthritis    "mild in hands, knees, ankles" (11/13/2015)   Atrial fibrillation ([name])    Consideration was given for atrial flutter ablation, but patient developed atrial fibrillation. Cardioversion was done. Dr. Graciela Husbands decided to watch him clinically. November, 2011   Atrial flutter [facility]) 07/2010   September, 2011   Hospital with PNA and cath done.Marland KitchenMarland KitchenCoumadin.  Atrial flutter ablation planned, but  pt. then had atrial fibrillation,/outpatient conversion 09/08/10..NSR..plan to follow..Dr. Graciela Husbands   CAD (coronary artery disease)    Catheterization, September, 2011,  grafts patent from redo CABG,, medical therapy of coronary disease, consideration to proceeding with atrial flutter ablation   Carotid artery disease ([name])    Doppler 09/18/2009 - 49% bilateral stenoses   Carotid artery disease ([name])    49% bilateral, Doppler, November, 2010   CHF (congestive heart failure) ([name])    Chronic kidney disease (CKD), stage III (moderate) ([name])    Diabetic  peripheral neuropathy ([name])    feet   Diverticulosis of colon with hemorrhage 2009   several unit diverticular bleed    Erectile dysfunction    Mild   Gout    Hearing loss    wears hearing aids   History of blood transfusion "several"   related to diverticular bleeding   Hyperlipidemia    Hypertension    Mitral regurgitation    Mild, echo, September, 2011   NSTEMI (non-ST elevated myocardial infarction) ([name]) 07/2010   at [facility] with repeat cath, rec medical mgmt    OSA on CPAP    uses CPAP nightly    Personal history of colonic polyps    PNA (pneumonia) 07/2010   NSTEMI at [facility] with repeat cath, rec medical mgmt    RBBB (right bundle branch block)    S/P mitral valve repair 02/06/2020   s/p TEER with a single MitraClip NTW placed on A2/P2   Shoulder pain    "positional; better now" (11/13/2015)   Skin cancer    R lower leg, per derm 2012   Type II diabetes mellitus ([name])    Allergies  Allergen Reactions   Nsaids     CKD         11/20/2023    2:26 PM 10/31/2023    2:21 PM 08/25/2023    2:27 PM  Depression screen PHQ 2/9  Decreased Interest 0 0 0  Down, Depressed, Hopeless 0 0 0  PHQ - 2 Score 0 0 0  Altered sleeping 0  3  Tired, decreased energy 0  0  Change in appetite 0  0  Feeling bad or failure about yourself  0  0  Trouble concentrating 0  0  Moving slowly or fidgety/restless 0  0  Suicidal thoughts 0  0  PHQ-9 Score 0  3  Difficult doing work/chores Not difficult at all  Not difficult at all       11/20/2023    2:28 PM 08/25/2023    2:27 PM 04/13/2023   12:35 PM 10/29/2020   12:33 PM  GAD 7 : Generalized Anxiety Score  Nervous, Anxious, on Edge 0 0 0 0  Control/stop worrying 0 0 0 0  Worry too much - different things 0 0 0 0  Trouble relaxing 0 0 0 0  Restless 0 0 0 0  Easily annoyed or irritable 0 1 1 0  Afraid - awful might happen 0 0 0 0  Total GAD 7 Score 0 1 1 0  Anxiety Difficulty Not difficult at all Not difficult at all Not difficult at all Not difficult at all      Review of Systems  Constitutional:  Negative for chills and fever.  Respiratory:  Negative for shortness of breath.   Cardiovascular:  Negative for chest pain.  Gastrointestinal:  Negative for abdominal pain, constipation, diarrhea, heartburn, nausea and vomiting.  Genitourinary:  Negative for dysuria, frequency and urgency.  Skin:        Laceration on right lower leg.  Neurological:  Negative for dizziness and headaches.  Endo/Heme/Allergies:  Negative for polydipsia.   Psychiatric/Behavioral:  Negative for depression and suicidal ideas. The patient is not nervous/anxious.       Objective:     BP 130/64 (BP Location: Left Arm, Patient Position: Sitting, Cuff Size: Normal)   Pulse (!) 58   Temp 97.6 F (36.4 C) (Oral)   Wt 165 lb (74.8 kg)   SpO2 96%   BMI 25.84 kg/m  BP Readings from Last 3 Encounters:  01/09/24 130/64  01/08/24 138/80  12/19/23 114/64   Wt Readings from Last 3 Encounters:  01/09/24 165 lb (74.8 kg)  01/08/24 166 lb (75.3 kg)  12/19/23 170 lb (77.1 kg)      Physical Exam Vitals and nursing note reviewed.  Constitutional:      Appearance: Normal appearance.  Cardiovascular:     Rate and Rhythm: Normal rate and regular rhythm.     Pulses: Normal pulses.     Heart sounds: Normal heart sounds.  Pulmonary:     Effort: Pulmonary effort is normal.     Breath sounds: Normal breath sounds.  Skin:    General: Skin is warm.     Findings: Laceration present.          Comments: Right leg wound.   Neurological:     Mental Status: He is alert and oriented to person, place, and time.  Psychiatric:        Mood and Affect: Mood normal.        Behavior: Behavior normal.        Thought Content: Thought content normal.        Judgment: Judgment normal.      No results found for any visits on 01/09/24.     The ASCVD Risk score (Arnett DK, et al., 2019) failed to calculate for the following reasons:   The 2019 ASCVD risk score is only valid for ages 33 to 18   Risk score cannot be calculated because patient has a medical history suggesting prior/existing ASCVD    Assessment & Plan:  Laceration of right lower extremity, initial encounter Assessment & Plan: New wound.  Hot to touch.  No bleeding today.  Vitals stable.  Given the presentation and risk for infection, will treat with doxycyline for 7 days.  Given his medication hx and he is on eliquis and he did have some bleeding, will check CBC, BMP (kidney dx).    Discussed ER precautions with patient and daughter. Verbalizes understanding.  Schedule f/u with pcp if not better.   Wound care referral placed.  Consent obtained to apply new dressing. Wound cleansed with soap and water, applied neosporin and applied new Kerlix at the appointment today. Tolerated well.  Orders: -     Doxycycline Hyclate; Take 1 tablet (100 mg total) by mouth 2 (two) times daily for 7 days.  Dispense: 14 tablet; Refill: 0 -     Ambulatory referral to Wound Clinic -     CBC -     Basic metabolic panel     Return if symptoms worsen or fail to improve.    [place] Charon, NP

## 2024-01-10 DIAGNOSIS — M898X9 Other specified disorders of bone, unspecified site: Secondary | ICD-10-CM | POA: Diagnosis not present

## 2024-01-10 DIAGNOSIS — I129 Hypertensive chronic kidney disease with stage 1 through stage 4 chronic kidney disease, or unspecified chronic kidney disease: Secondary | ICD-10-CM | POA: Diagnosis not present

## 2024-01-10 DIAGNOSIS — N189 Chronic kidney disease, unspecified: Secondary | ICD-10-CM | POA: Diagnosis not present

## 2024-01-10 DIAGNOSIS — D631 Anemia in chronic kidney disease: Secondary | ICD-10-CM | POA: Diagnosis not present

## 2024-01-10 DIAGNOSIS — N184 Chronic kidney disease, stage 4 (severe): Secondary | ICD-10-CM | POA: Diagnosis not present

## 2024-01-10 LAB — BASIC METABOLIC PANEL
BUN: 47 mg/dL — ABNORMAL HIGH (ref 6–23)
CO2: 29 meq/L (ref 19–32)
Calcium: 9.4 mg/dL (ref 8.4–10.5)
Chloride: 98 meq/L (ref 96–112)
Creatinine, Ser: 2.92 mg/dL — ABNORMAL HIGH (ref 0.40–1.50)
GFR: 18.4 mL/min — ABNORMAL LOW (ref >60.00)
Glucose, Bld: 226 mg/dL — ABNORMAL HIGH (ref 70–99)
Potassium: 4.3 meq/L (ref 3.5–5.1)
Sodium: 138 meq/L (ref 135–145)

## 2024-01-10 LAB — CBC
HCT: 32.9 % — ABNORMAL LOW (ref 39.0–52.0)
Hemoglobin: 10.7 g/dL — ABNORMAL LOW (ref 13.0–17.0)
MCHC: 32.6 g/dL (ref 30.0–36.0)
MCV: 95.8 fl (ref 78.0–100.0)
Platelets: 124 10*3/uL — ABNORMAL LOW (ref 150.0–400.0)
RBC: 3.43 Mil/uL — ABNORMAL LOW (ref 4.22–5.81)
RDW: 15.8 % — ABNORMAL HIGH (ref 11.5–15.5)
WBC: 5.3 10*3/uL (ref 4.0–10.5)

## 2024-01-12 ENCOUNTER — Telehealth: Payer: Self-pay

## 2024-01-12 NOTE — Telephone Encounter (Signed)
 Copied from CRM [id number]. Topic: Clinical - Lab/Test Results >> Jan 12, 2024  4:25 PM Fredrich Romans wrote: Reason for CRM: Patient returned call to [facility] regarding  lab results on 01/10/2024.Patient would like a return call.

## 2024-01-12 NOTE — Telephone Encounter (Signed)
 See lab results documentation.

## 2024-01-15 ENCOUNTER — Other Ambulatory Visit: Payer: Self-pay | Admitting: Physician Assistant

## 2024-01-23 ENCOUNTER — Ambulatory Visit
Admission: RE | Admit: 2024-01-23 | Discharge: 2024-01-23 | Disposition: A | Discharge status: Home / Self Care | Source: Ambulatory Visit | Attending: Family Medicine | Admitting: Family Medicine

## 2024-01-23 ENCOUNTER — Encounter: Payer: Self-pay | Admitting: Family Medicine

## 2024-01-23 ENCOUNTER — Ambulatory Visit: Payer: Medicare Other | Admitting: Family Medicine

## 2024-01-23 VITALS — BP 138/62 | HR 66 | Temp 97.9°F | Ht 67.0 in

## 2024-01-23 DIAGNOSIS — M25511 Pain in right shoulder: Secondary | ICD-10-CM

## 2024-01-23 DIAGNOSIS — M19011 Primary osteoarthritis, right shoulder: Secondary | ICD-10-CM | POA: Diagnosis not present

## 2024-01-23 DIAGNOSIS — L989 Disorder of the skin and subcutaneous tissue, unspecified: Secondary | ICD-10-CM | POA: Diagnosis not present

## 2024-01-23 DIAGNOSIS — M25519 Pain in unspecified shoulder: Secondary | ICD-10-CM

## 2024-01-23 MED ORDER — HYDROCODONE-ACETAMINOPHEN 5-325 MG PO TABS
0.5000 | ORAL_TABLET | Freq: Two times a day (BID) | ORAL | 0 refills | Status: DC | PRN
Start: 2024-01-23 — End: 2024-01-26

## 2024-01-23 NOTE — Patient Instructions (Addendum)
 Your leg looks better.  I would use a nonstick pad with an ACE wrap.  I would gently clean it with soapy water daily and then recover.    Hydrocodone as needed- sedation caution, take with miralax.   Go to the lab on the way out.   If you have mychart we'll likely use that to update you.    Take care.  Glad to see you.

## 2024-01-23 NOTE — Progress Notes (Unsigned)
 R shoulder pain.   Has fallen 3 times recently.  Each time was trying to pick up an object and the L knee gave out.  Wasn't dizzy, room didn't spin.  Wasn't lightheaded.  No LOC.  Had to get help to get up each time.   One time he was down overnight.  Lives alone.  D/w pt about options.  He is considering moving to [place].  D/w pt about options.  He is keeping a phone available now at all times.    R leg wound.  Changing dressing daily.  He has would clinic f/u pending. Done with abx.  No fevers.   Meds, vitals, and allergies reviewed.   ROS: Per HPI unless specifically indicated in ROS section   Nad Ncat R shoulder with pain on internal and external rotation.  Pain with arm abduction.  Right shoulder pain with pushing down on a chair to stand.  Grip still intact. IRR CTAB 4x2 cm healing wound on the right shin.  Normal granulation tissue.  This appears to be healing over smoothly and normally.  Does not appear infected.  35 minutes were devoted to patient care in this encounter (this includes time spent reviewing the patient's file/history, interviewing and examining the patient, counseling/reviewing plan with patient).

## 2024-01-24 ENCOUNTER — Telehealth: Payer: Self-pay | Admitting: Family Medicine

## 2024-01-24 NOTE — Assessment & Plan Note (Signed)
 Previous injury clearly looks better. I would use a nonstick pad with an ACE wrap-dressed at office visit.  I would gently clean it with soapy water daily and then recover.   He can update me as needed.

## 2024-01-24 NOTE — Assessment & Plan Note (Signed)
 We talked about his level of care and he is considering options.  He is thinking about moving into [place].  Discussed safety in the meantime. For the shoulder pain, check x-ray today, can use hydrocodone as needed- sedation caution, take with miralax.  He has limited options for pain medication otherwise.  Discussed.  Refer to orthopedics.

## 2024-01-24 NOTE — Telephone Encounter (Signed)
 Notify patient.  I do not see a fracture on his x-ray.  I think it makes sense to follow-up with orthopedics and use the pain medicine as planned.  Please update me as needed.  Thanks.

## 2024-01-25 NOTE — Telephone Encounter (Signed)
 Unable to reach patient. Left voicemail to return call to our office.

## 2024-01-26 ENCOUNTER — Other Ambulatory Visit: Payer: Self-pay

## 2024-01-26 DIAGNOSIS — I350 Nonrheumatic aortic (valve) stenosis: Secondary | ICD-10-CM

## 2024-01-26 DIAGNOSIS — N184 Chronic kidney disease, stage 4 (severe): Secondary | ICD-10-CM

## 2024-01-26 MED ORDER — HYDROCODONE-ACETAMINOPHEN 5-325 MG PO TABS
0.5000 | ORAL_TABLET | Freq: Two times a day (BID) | ORAL | 0 refills | Status: DC | PRN
Start: 2024-01-26 — End: 2024-04-12

## 2024-01-26 NOTE — Telephone Encounter (Signed)
 Noted. Thanks.  I sent the rx for #20 tabs in the meantime.

## 2024-01-26 NOTE — Addendum Note (Signed)
 Addended by: Joaquim Nam on: 01/26/2024 04:54 PM   Modules accepted: Orders

## 2024-01-26 NOTE — Telephone Encounter (Signed)
 Called pt.   Pt complains that he has too much appointments going on to follow up with ortho. Pt states of CT scan and MRI coming up. Says it is too much for him right now.   Pt also mentioned the need for a routine prescription for Hydrocodone.  States he was only given 10 days.  Pt splits in half and complains the medication does help with pain but complains that he would need refills.

## 2024-01-30 NOTE — Telephone Encounter (Signed)
 Patient notified

## 2024-01-31 ENCOUNTER — Inpatient Hospital Stay (HOSPITAL_COMMUNITY)
Admission: EM | Admit: 2024-01-31 | Discharge: 2024-02-03 | DRG: 981 | Disposition: A | Discharge status: Home Health | Attending: Internal Medicine | Admitting: Internal Medicine

## 2024-01-31 ENCOUNTER — Encounter (HOSPITAL_COMMUNITY): Payer: Self-pay

## 2024-01-31 ENCOUNTER — Other Ambulatory Visit: Payer: Self-pay

## 2024-01-31 ENCOUNTER — Emergency Department (HOSPITAL_COMMUNITY)

## 2024-01-31 DIAGNOSIS — I25810 Atherosclerosis of coronary artery bypass graft(s) without angina pectoris: Secondary | ICD-10-CM | POA: Diagnosis present

## 2024-01-31 DIAGNOSIS — R112 Nausea with vomiting, unspecified: Secondary | ICD-10-CM | POA: Diagnosis not present

## 2024-01-31 DIAGNOSIS — I080 Rheumatic disorders of both mitral and aortic valves: Secondary | ICD-10-CM | POA: Diagnosis present

## 2024-01-31 DIAGNOSIS — Z66 Do not resuscitate: Secondary | ICD-10-CM | POA: Diagnosis not present

## 2024-01-31 DIAGNOSIS — D649 Anemia, unspecified: Secondary | ICD-10-CM | POA: Diagnosis present

## 2024-01-31 DIAGNOSIS — Z8249 Family history of ischemic heart disease and other diseases of the circulatory system: Secondary | ICD-10-CM

## 2024-01-31 DIAGNOSIS — N184 Chronic kidney disease, stage 4 (severe): Secondary | ICD-10-CM | POA: Diagnosis not present

## 2024-01-31 DIAGNOSIS — Z886 Allergy status to analgesic agent status: Secondary | ICD-10-CM

## 2024-01-31 DIAGNOSIS — I255 Ischemic cardiomyopathy: Secondary | ICD-10-CM | POA: Diagnosis present

## 2024-01-31 DIAGNOSIS — I10 Essential (primary) hypertension: Secondary | ICD-10-CM | POA: Diagnosis present

## 2024-01-31 DIAGNOSIS — I482 Chronic atrial fibrillation, unspecified: Secondary | ICD-10-CM

## 2024-01-31 DIAGNOSIS — J42 Unspecified chronic bronchitis: Secondary | ICD-10-CM | POA: Diagnosis not present

## 2024-01-31 DIAGNOSIS — G4733 Obstructive sleep apnea (adult) (pediatric): Secondary | ICD-10-CM | POA: Diagnosis present

## 2024-01-31 DIAGNOSIS — I5022 Chronic systolic (congestive) heart failure: Secondary | ICD-10-CM | POA: Diagnosis present

## 2024-01-31 DIAGNOSIS — Z7984 Long term (current) use of oral hypoglycemic drugs: Secondary | ICD-10-CM

## 2024-01-31 DIAGNOSIS — I2582 Chronic total occlusion of coronary artery: Secondary | ICD-10-CM | POA: Diagnosis present

## 2024-01-31 DIAGNOSIS — I130 Hypertensive heart and chronic kidney disease with heart failure and stage 1 through stage 4 chronic kidney disease, or unspecified chronic kidney disease: Secondary | ICD-10-CM | POA: Diagnosis not present

## 2024-01-31 DIAGNOSIS — Z79899 Other long term (current) drug therapy: Secondary | ICD-10-CM

## 2024-01-31 DIAGNOSIS — I5030 Unspecified diastolic (congestive) heart failure: Secondary | ICD-10-CM

## 2024-01-31 DIAGNOSIS — G4730 Sleep apnea, unspecified: Secondary | ICD-10-CM | POA: Diagnosis not present

## 2024-01-31 DIAGNOSIS — Z794 Long term (current) use of insulin: Secondary | ICD-10-CM | POA: Diagnosis not present

## 2024-01-31 DIAGNOSIS — H353230 Exudative age-related macular degeneration, bilateral, stage unspecified: Secondary | ICD-10-CM | POA: Diagnosis present

## 2024-01-31 DIAGNOSIS — I4821 Permanent atrial fibrillation: Secondary | ICD-10-CM | POA: Diagnosis present

## 2024-01-31 DIAGNOSIS — Z955 Presence of coronary angioplasty implant and graft: Secondary | ICD-10-CM

## 2024-01-31 DIAGNOSIS — I5023 Acute on chronic systolic (congestive) heart failure: Secondary | ICD-10-CM | POA: Diagnosis not present

## 2024-01-31 DIAGNOSIS — E162 Hypoglycemia, unspecified: Secondary | ICD-10-CM | POA: Diagnosis present

## 2024-01-31 DIAGNOSIS — I350 Nonrheumatic aortic (valve) stenosis: Secondary | ICD-10-CM

## 2024-01-31 DIAGNOSIS — E785 Hyperlipidemia, unspecified: Secondary | ICD-10-CM | POA: Diagnosis present

## 2024-01-31 DIAGNOSIS — Z9889 Other specified postprocedural states: Secondary | ICD-10-CM | POA: Diagnosis not present

## 2024-01-31 DIAGNOSIS — R296 Repeated falls: Secondary | ICD-10-CM | POA: Diagnosis present

## 2024-01-31 DIAGNOSIS — E11649 Type 2 diabetes mellitus with hypoglycemia without coma: Principal | ICD-10-CM | POA: Diagnosis present

## 2024-01-31 DIAGNOSIS — I2510 Atherosclerotic heart disease of native coronary artery without angina pectoris: Secondary | ICD-10-CM | POA: Diagnosis present

## 2024-01-31 DIAGNOSIS — Z7902 Long term (current) use of antithrombotics/antiplatelets: Secondary | ICD-10-CM

## 2024-01-31 DIAGNOSIS — I509 Heart failure, unspecified: Secondary | ICD-10-CM | POA: Diagnosis not present

## 2024-01-31 DIAGNOSIS — E860 Dehydration: Secondary | ICD-10-CM | POA: Diagnosis not present

## 2024-01-31 DIAGNOSIS — E1122 Type 2 diabetes mellitus with diabetic chronic kidney disease: Secondary | ICD-10-CM | POA: Diagnosis present

## 2024-01-31 DIAGNOSIS — Z823 Family history of stroke: Secondary | ICD-10-CM

## 2024-01-31 DIAGNOSIS — R5381 Other malaise: Secondary | ICD-10-CM | POA: Diagnosis present

## 2024-01-31 DIAGNOSIS — I4510 Unspecified right bundle-branch block: Secondary | ICD-10-CM | POA: Diagnosis present

## 2024-01-31 DIAGNOSIS — Z9049 Acquired absence of other specified parts of digestive tract: Secondary | ICD-10-CM

## 2024-01-31 DIAGNOSIS — H9190 Unspecified hearing loss, unspecified ear: Secondary | ICD-10-CM | POA: Diagnosis present

## 2024-01-31 DIAGNOSIS — E663 Overweight: Secondary | ICD-10-CM | POA: Diagnosis present

## 2024-01-31 DIAGNOSIS — Z7901 Long term (current) use of anticoagulants: Secondary | ICD-10-CM

## 2024-01-31 DIAGNOSIS — Z951 Presence of aortocoronary bypass graft: Secondary | ICD-10-CM | POA: Diagnosis not present

## 2024-01-31 DIAGNOSIS — Z974 Presence of external hearing-aid: Secondary | ICD-10-CM

## 2024-01-31 DIAGNOSIS — Z952 Presence of prosthetic heart valve: Secondary | ICD-10-CM

## 2024-01-31 DIAGNOSIS — R110 Nausea: Secondary | ICD-10-CM | POA: Diagnosis not present

## 2024-01-31 DIAGNOSIS — E1142 Type 2 diabetes mellitus with diabetic polyneuropathy: Secondary | ICD-10-CM | POA: Diagnosis present

## 2024-01-31 DIAGNOSIS — Z860100 Personal history of colon polyps, unspecified: Secondary | ICD-10-CM

## 2024-01-31 DIAGNOSIS — Z833 Family history of diabetes mellitus: Secondary | ICD-10-CM

## 2024-01-31 DIAGNOSIS — I252 Old myocardial infarction: Secondary | ICD-10-CM

## 2024-01-31 DIAGNOSIS — Z841 Family history of disorders of kidney and ureter: Secondary | ICD-10-CM

## 2024-01-31 DIAGNOSIS — M7989 Other specified soft tissue disorders: Secondary | ICD-10-CM | POA: Diagnosis present

## 2024-01-31 DIAGNOSIS — E161 Other hypoglycemia: Secondary | ICD-10-CM | POA: Diagnosis not present

## 2024-01-31 DIAGNOSIS — Z8261 Family history of arthritis: Secondary | ICD-10-CM

## 2024-01-31 DIAGNOSIS — E782 Mixed hyperlipidemia: Secondary | ICD-10-CM | POA: Diagnosis not present

## 2024-01-31 DIAGNOSIS — M159 Polyosteoarthritis, unspecified: Secondary | ICD-10-CM | POA: Diagnosis present

## 2024-01-31 DIAGNOSIS — Z602 Problems related to living alone: Secondary | ICD-10-CM | POA: Diagnosis present

## 2024-01-31 DIAGNOSIS — Z85828 Personal history of other malignant neoplasm of skin: Secondary | ICD-10-CM

## 2024-01-31 DIAGNOSIS — N1832 Chronic kidney disease, stage 3b: Secondary | ICD-10-CM | POA: Diagnosis not present

## 2024-01-31 DIAGNOSIS — Z87891 Personal history of nicotine dependence: Secondary | ICD-10-CM

## 2024-01-31 DIAGNOSIS — T383X1A Poisoning by insulin and oral hypoglycemic [antidiabetic] drugs, accidental (unintentional), initial encounter: Secondary | ICD-10-CM | POA: Diagnosis not present

## 2024-01-31 DIAGNOSIS — Z91148 Patient's other noncompliance with medication regimen for other reason: Secondary | ICD-10-CM

## 2024-01-31 DIAGNOSIS — I517 Cardiomegaly: Secondary | ICD-10-CM | POA: Diagnosis not present

## 2024-01-31 DIAGNOSIS — E8809 Other disorders of plasma-protein metabolism, not elsewhere classified: Secondary | ICD-10-CM | POA: Diagnosis not present

## 2024-01-31 DIAGNOSIS — Z6825 Body mass index (BMI) 25.0-25.9, adult: Secondary | ICD-10-CM

## 2024-01-31 DIAGNOSIS — R197 Diarrhea, unspecified: Secondary | ICD-10-CM | POA: Diagnosis present

## 2024-01-31 DIAGNOSIS — I110 Hypertensive heart disease with heart failure: Secondary | ICD-10-CM | POA: Diagnosis not present

## 2024-01-31 LAB — COMPREHENSIVE METABOLIC PANEL
ALT: 21 U/L (ref 0–44)
AST: 50 U/L — ABNORMAL HIGH (ref 15–41)
Albumin: 3.1 g/dL — ABNORMAL LOW (ref 3.5–5.0)
Alkaline Phosphatase: 69 U/L (ref 38–126)
Anion gap: 15 (ref 5–15)
BUN: 45 mg/dL — ABNORMAL HIGH (ref 8–23)
CO2: 22 mmol/L (ref 22–32)
Calcium: 8.5 mg/dL — ABNORMAL LOW (ref 8.9–10.3)
Chloride: 100 mmol/L (ref 98–111)
Creatinine, Ser: 2.89 mg/dL — ABNORMAL HIGH (ref 0.61–1.24)
GFR, Estimated: 20 mL/min — ABNORMAL LOW (ref >60)
Glucose, Bld: 51 mg/dL — ABNORMAL LOW (ref 70–99)
Potassium: 3.7 mmol/L (ref 3.5–5.1)
Sodium: 137 mmol/L (ref 135–145)
Total Bilirubin: 1.8 mg/dL — ABNORMAL HIGH (ref 0.0–1.2)
Total Protein: 6.7 g/dL (ref 6.5–8.1)

## 2024-01-31 LAB — CBC WITH DIFFERENTIAL/PLATELET
Abs Immature Granulocytes: 0.01 10*3/uL (ref 0.00–0.07)
Basophils Absolute: 0 10*3/uL (ref 0.0–0.1)
Basophils Relative: 0 %
Eosinophils Absolute: 0 10*3/uL (ref 0.0–0.5)
Eosinophils Relative: 0 %
HCT: 33 % — ABNORMAL LOW (ref 39.0–52.0)
Hemoglobin: 10.2 g/dL — ABNORMAL LOW (ref 13.0–17.0)
Immature Granulocytes: 0 %
Lymphocytes Relative: 17 %
Lymphs Abs: 0.9 10*3/uL (ref 0.7–4.0)
MCH: 31.3 pg (ref 26.0–34.0)
MCHC: 30.9 g/dL (ref 30.0–36.0)
MCV: 101.2 fL — ABNORMAL HIGH (ref 80.0–100.0)
Monocytes Absolute: 0.5 10*3/uL (ref 0.1–1.0)
Monocytes Relative: 10 %
Neutro Abs: 4.1 10*3/uL (ref 1.7–7.7)
Neutrophils Relative %: 73 %
Platelets: 140 10*3/uL — ABNORMAL LOW (ref 150–400)
RBC: 3.26 MIL/uL — ABNORMAL LOW (ref 4.22–5.81)
RDW: 16.5 % — ABNORMAL HIGH (ref 11.5–15.5)
WBC: 5.6 10*3/uL (ref 4.0–10.5)
nRBC: 0 % (ref 0.0–0.2)

## 2024-01-31 LAB — CBG MONITORING, ED
Glucose-Capillary: 22 mg/dL — CL (ref 70–99)
Glucose-Capillary: 23 mg/dL — CL (ref 70–99)
Glucose-Capillary: 49 mg/dL — ABNORMAL LOW (ref 70–99)
Glucose-Capillary: 118 mg/dL — ABNORMAL HIGH (ref 70–99)
Glucose-Capillary: 86 mg/dL (ref 70–99)
Glucose-Capillary: 95 mg/dL (ref 70–99)
Glucose-Capillary: 97 mg/dL (ref 70–99)
Glucose-Capillary: 97 mg/dL (ref 70–99)
Glucose-Capillary: 91 mg/dL (ref 70–99)
Glucose-Capillary: 99 mg/dL (ref 70–99)
Glucose-Capillary: 96 mg/dL (ref 70–99)
Glucose-Capillary: 93 mg/dL (ref 70–99)

## 2024-01-31 LAB — GLUCOSE, CAPILLARY
Glucose-Capillary: 90 mg/dL (ref 70–99)
Glucose-Capillary: 111 mg/dL — ABNORMAL HIGH (ref 70–99)

## 2024-01-31 LAB — ETHANOL: Alcohol, Ethyl (B): <10 mg/dL (ref <10)

## 2024-01-31 LAB — BRAIN NATRIURETIC PEPTIDE: B Natriuretic Peptide: 995.3 pg/mL — ABNORMAL HIGH (ref 0.0–100.0)

## 2024-01-31 LAB — MRSA NEXT GEN BY PCR, NASAL: MRSA by PCR Next Gen: NOT DETECTED

## 2024-01-31 MED ORDER — BISACODYL 5 MG PO TBEC: 5.0000 mg | DELAYED_RELEASE_TABLET | Freq: Every day | ORAL | Status: DC | PRN

## 2024-01-31 MED ORDER — ACETAMINOPHEN 650 MG RE SUPP: 650.0000 mg | Freq: Four times a day (QID) | RECTAL | Status: DC | PRN

## 2024-01-31 MED ORDER — TORSEMIDE 20 MG PO TABS: 40.0000 mg | ORAL_TABLET | Freq: Two times a day (BID) | ORAL | Status: DC

## 2024-01-31 MED ORDER — PANTOPRAZOLE SODIUM 40 MG PO TBEC
40.0000 mg | DELAYED_RELEASE_TABLET | Freq: Every day | ORAL | Status: DC
  Administered 2024-02-01 – 2024-02-03 (×3): 40 mg via ORAL
  Filled 2024-01-31 (×3): qty 1

## 2024-01-31 MED ORDER — ROSUVASTATIN CALCIUM 5 MG PO TABS
10.0000 mg | ORAL_TABLET | Freq: Every morning | ORAL | Status: DC
  Administered 2024-02-01 – 2024-02-03 (×3): 10 mg via ORAL
  Filled 2024-01-31 (×3): qty 2

## 2024-01-31 MED ORDER — APIXABAN 2.5 MG PO TABS
2.5000 mg | ORAL_TABLET | Freq: Two times a day (BID) | ORAL | Status: DC
  Administered 2024-01-31: 2.5 mg via ORAL
  Filled 2024-01-31: qty 1

## 2024-01-31 MED ORDER — SODIUM CHLORIDE 0.9% FLUSH
3.0000 mL | Freq: Two times a day (BID) | INTRAVENOUS | Status: DC
  Administered 2024-01-31 – 2024-02-03 (×7): 3 mL via INTRAVENOUS

## 2024-01-31 MED ORDER — ASPIRIN 81 MG PO TBEC
81.0000 mg | DELAYED_RELEASE_TABLET | Freq: Every day | ORAL | Status: DC
  Administered 2024-02-01 – 2024-02-03 (×2): 81 mg via ORAL
  Filled 2024-01-31 (×2): qty 1

## 2024-01-31 MED ORDER — METOPROLOL SUCCINATE ER 100 MG PO TB24
100.0000 mg | ORAL_TABLET | Freq: Every day | ORAL | Status: DC
  Administered 2024-02-01 – 2024-02-03 (×3): 100 mg via ORAL
  Filled 2024-01-31 (×3): qty 1

## 2024-01-31 MED ORDER — HYDRALAZINE HCL 25 MG PO TABS
25.0000 mg | ORAL_TABLET | Freq: Every day | ORAL | Status: DC
Start: 2024-01-31 — End: 2024-02-03
  Administered 2024-01-31 – 2024-02-02 (×3): 25 mg via ORAL
  Filled 2024-01-31 (×3): qty 1

## 2024-01-31 MED ORDER — SODIUM CHLORIDE 0.9 % IV SOLN: INTRAVENOUS | Status: DC

## 2024-01-31 MED ORDER — ISOSORBIDE MONONITRATE ER 60 MG PO TB24
90.0000 mg | ORAL_TABLET | Freq: Every day | ORAL | Status: DC
  Administered 2024-02-01 – 2024-02-03 (×3): 90 mg via ORAL
  Filled 2024-01-31 (×3): qty 1

## 2024-01-31 MED ORDER — ACETAMINOPHEN 325 MG PO TABS: 650.0000 mg | ORAL_TABLET | Freq: Four times a day (QID) | ORAL | Status: DC | PRN

## 2024-01-31 MED ORDER — ALLOPURINOL 100 MG PO TABS
50.0000 mg | ORAL_TABLET | ORAL | Status: DC
  Administered 2024-01-31 – 2024-02-02 (×2): 50 mg via ORAL
  Filled 2024-01-31 (×2): qty 1

## 2024-01-31 MED ORDER — FUROSEMIDE 10 MG/ML IJ SOLN
40.0000 mg | Freq: Once | INTRAMUSCULAR | Status: AC
  Administered 2024-01-31: 40 mg via INTRAVENOUS
  Filled 2024-01-31: qty 4

## 2024-01-31 MED ORDER — HYDROCODONE-ACETAMINOPHEN 5-325 MG PO TABS
0.5000 | ORAL_TABLET | Freq: Two times a day (BID) | ORAL | Status: DC | PRN
  Administered 2024-01-31 – 2024-02-02 (×3): 1 via ORAL
  Filled 2024-01-31 (×3): qty 1

## 2024-01-31 MED ORDER — TORSEMIDE 20 MG PO TABS: 40.0000 mg | ORAL_TABLET | Freq: Every day | ORAL | Status: DC

## 2024-01-31 MED ORDER — INSULIN ASPART 100 UNIT/ML IJ SOLN: 0.0000 [IU] | Freq: Three times a day (TID) | INTRAMUSCULAR | Status: DC

## 2024-01-31 MED ORDER — MAGNESIUM OXIDE 400 MG PO TABS: 400.0000 mg | ORAL_TABLET | ORAL | Status: DC

## 2024-01-31 MED ORDER — SODIUM CHLORIDE 0.9 % IV BOLUS
1000.0000 mL | Freq: Once | INTRAVENOUS | Status: AC
  Administered 2024-01-31: 1000 mL via INTRAVENOUS

## 2024-01-31 MED ORDER — TORSEMIDE 20 MG PO TABS: 20.0000 mg | ORAL_TABLET | Freq: Every day | ORAL | Status: DC

## 2024-01-31 MED ORDER — POLYETHYLENE GLYCOL 3350 17 G PO PACK
17.0000 g | PACK | Freq: Every day | ORAL | Status: DC
  Administered 2024-01-31 – 2024-02-01 (×2): 17 g via ORAL
  Filled 2024-01-31 (×2): qty 1

## 2024-01-31 MED ORDER — RANOLAZINE ER 500 MG PO TB12
500.0000 mg | ORAL_TABLET | Freq: Two times a day (BID) | ORAL | Status: DC
  Administered 2024-01-31 – 2024-02-03 (×6): 500 mg via ORAL
  Filled 2024-01-31 (×7): qty 1

## 2024-01-31 MED ORDER — INSULIN ASPART 100 UNIT/ML IJ SOLN: 0.0000 [IU] | Freq: Every day | INTRAMUSCULAR | Status: DC

## 2024-01-31 MED ORDER — POLYETHYLENE GLYCOL 3350 17 G PO PACK: 17.0000 g | PACK | Freq: Every day | ORAL | Status: DC | PRN

## 2024-01-31 MED ORDER — HYDRALAZINE HCL 20 MG/ML IJ SOLN: 5.0000 mg | INTRAMUSCULAR | Status: DC | PRN

## 2024-01-31 MED ORDER — DEXTROSE 50 % IV SOLN
1.0000 | Freq: Once | INTRAVENOUS | Status: AC
  Administered 2024-01-31: 50 mL via INTRAVENOUS
  Filled 2024-01-31: qty 50

## 2024-01-31 MED ORDER — ASPIRIN 81 MG PO TABS: 81.0000 mg | ORAL_TABLET | Freq: Every morning | ORAL | Status: DC

## 2024-01-31 MED ORDER — MAGNESIUM OXIDE -MG SUPPLEMENT 400 (240 MG) MG PO TABS
400.0000 mg | ORAL_TABLET | Freq: Every day | ORAL | Status: DC
  Administered 2024-01-31 – 2024-02-02 (×3): 400 mg via ORAL
  Filled 2024-01-31 (×3): qty 1

## 2024-01-31 NOTE — Plan of Care (Signed)
  Problem: Fluid Volume: Goal: Ability to maintain a balanced intake and output will improve Outcome: Progressing   Problem: Metabolic: Goal: Ability to maintain appropriate glucose levels will improve Outcome: Progressing   Problem: Skin Integrity: Goal: Risk for impaired skin integrity will decrease Outcome: Progressing   Problem: Tissue Perfusion: Goal: Adequacy of tissue perfusion will improve Outcome: Progressing   Problem: Clinical Measurements: Goal: Ability to maintain clinical measurements within normal limits will improve Outcome: Progressing Goal: Will remain free from infection Outcome: Progressing Goal: Diagnostic test results will improve Outcome: Progressing Goal: Respiratory complications will improve Outcome: Progressing Goal: Cardiovascular complication will be avoided Outcome: Progressing   Problem: Activity: Goal: Risk for activity intolerance will decrease Outcome: Progressing   Problem: Nutrition: Goal: Adequate nutrition will be maintained Outcome: Progressing   Problem: Coping: Goal: Level of anxiety will decrease Outcome: Progressing   Problem: Pain Managment: Goal: General experience of comfort will improve and/or be controlled Outcome: Progressing   Problem: Safety: Goal: Ability to remain free from injury will improve Outcome: Progressing   Problem: Skin Integrity: Goal: Risk for impaired skin integrity will decrease Outcome: Progressing   Problem: Activity: Goal: Capacity to carry out activities will improve Outcome: Progressing   Problem: Cardiac: Goal: Ability to achieve and maintain adequate cardiopulmonary perfusion will improve Outcome: Progressing

## 2024-01-31 NOTE — ED Provider Notes (Signed)
 [place] EMERGENCY DEPARTMENT AT [facility] Provider Note   CSN: [id number] Arrival date & time: 01/31/24  4098     History  Chief Complaint  Patient presents with   Hypoglycemia   Edema    [name] is a 89 y.o. male.  HPI Patient presents initially alone, via EMS, but is subsequently joined by his daughter.  Patient has multiple medical history including aortic stenosis, insulin-dependent diabetes, heart failure.  Initially seem to the patient has had weakness for the past several days, and called his daughter today for evaluation and consideration of transfer here.  Daughter noted weakness, called 911, and on their evaluation patient's glucose level was less than 60.  Doubt substantial improvement with graham crackers, peanut butter, patient was brought here for evaluation.  Patient denies chest pain, confusion.  He does not knowledge feeling generally poorly in general.  Seems though he has been taking his medication as directed, including additional Lasix due to substantial lower extremity edema.    Home Medications Prior to Admission medications   Medication Sig Start Date End Date Taking? Authorizing Provider  allopurinol (ZYLOPRIM) 100 MG tablet TAKE ONE-HALF (1/2) TABLET EVERY MONDAY, [facility] AND FRIDAY 01/13/23   Joaquim Nam, MD  amoxicillin (AMOXIL) 500 MG tablet Take by mouth. Patient takes 4 tablets prior to going to the dentist 12/28/21   [provider]  aspirin 81 MG tablet Take 81 mg by mouth daily.    [provider]  BD INSULIN SYRINGE ULTRAFINE 31G X 5/16" 0.3 ML MISC USE DAILY AS INSTRUCTED 12/02/15   Joaquim Nam, MD  Cholecalciferol (VITAMIN D) 1000 UNITS capsule Take 1,000 Units by mouth daily.     [provider]  diclofenac Sodium (VOLTAREN) 1 % GEL Apply 2 g topically 4 (four) times daily. 07/20/20   Janace Aris, FNP  ELIQUIS 2.5 MG TABS tablet TAKE 1 TABLET TWICE A DAY 12/04/23   Runell Gess, MD   empagliflozin (JARDIANCE) 10 MG TABS tablet Take 1 tablet (10 mg total) by mouth daily. 10/06/23   Runell Gess, MD  fish oil-omega-3 fatty acids 1000 MG capsule Take 1 g by mouth daily.    [provider]  glucose blood (FREESTYLE LITE) test strip USE TO TEST BLOOD SUGAR ONCE DAILY AND AS NEEDED 11/23/23   Joaquim Nam, MD  hydrALAZINE (APRESOLINE) 25 MG tablet Take 1 tablet (25 mg total) by mouth daily. 12/02/22   Runell Gess, MD  HYDROcodone-acetaminophen (NORCO/VICODIN) 5-325 MG tablet Take 0.5-1 tablets by mouth 2 (two) times daily as needed for moderate pain (pain score 4-6). 01/26/24   Joaquim Nam, MD  ipratropium (ATROVENT) 0.03 % nasal spray USE 2 SPRAYS IN EACH NOSTRIL TWICE A DAY AS NEEDED FOR RHINITIS 03/06/23   Joaquim Nam, MD  isosorbide mononitrate (IMDUR) 60 MG 24 hr tablet Take 1.5 tablets (90 mg total) by mouth daily. 12/19/23   Runell Gess, MD  LANTUS 100 UNIT/ML injection INJECT 20 TO 25 UNITS AT BEDTIME 09/11/23   Joaquim Nam, MD  magnesium oxide (MAG-OX) 400 MG tablet Take 1 tablet (400 mg total) by mouth every other day. 04/13/23   Joaquim Nam, MD  metoprolol tartrate (LOPRESSOR) 50 MG tablet TAKE 1 TABLET(50 MG) BY MOUTH TWICE DAILY 02/22/23   Runell Gess, MD  Multiple Vitamin (MULTIVITAMIN WITH MINERALS) TABS Take 1 tablet by mouth daily.    [provider]  nitroGLYCERIN (NITROSTAT) 0.4  MG SL tablet DISSOLVE 1 TABLET UNDER THE TONGUE EVERY 5 MINUTES AS NEEDED FOR CHEST PAIN FOR A MAXIMUM OF 3 DOSES 07/01/22   Runell Gess, MD  pantoprazole (PROTONIX) 40 MG tablet Take 1 tablet (40 mg total) by mouth 2 (two) times daily for 42 days, THEN 1 tablet (40 mg total) daily. 08/25/23 12/05/23  Joaquim Nam, MD  polyethylene glycol (MIRALAX / GLYCOLAX) 17 g packet Take 17 g by mouth daily.    [provider]  Potassium Gluconate 595 MG CAPS Take 595 mg by mouth daily.    [provider]  ranolazine  (RANEXA) 500 MG 12 hr tablet TAKE 1 TABLET TWICE A DAY 03/08/23   Runell Gess, MD  rosuvastatin (CRESTOR) 10 MG tablet Take 1 tablet (10 mg total) by mouth daily. 03/01/23   Tereso Newcomer T, PA-C  torsemide (DEMADEX) 20 MG tablet TAKE 2 TABLETS IN THE MORNING AND 1 TABLET IN THE EVENING 01/17/24   Tonny Bollman, MD  valACYclovir (VALTREX) 1000 MG tablet Take by mouth. Used as needed for a cold sore. 08/30/22   [provider]      Allergies    Nsaids    Review of Systems   Review of Systems  Physical Exam Updated Vital Signs BP 126/63   Pulse 69   Temp 97.9 F (36.6 C)   Resp 13   Ht 5\' 7"  (1.702 m)   Wt 70.1 kg   SpO2 100%   BMI 24.21 kg/m  Physical Exam Vitals and nursing note reviewed.  Constitutional:      General: He is not in acute distress.    Appearance: He is well-developed. He is ill-appearing.  HENT:     Head: Normocephalic and atraumatic.  Eyes:     Conjunctiva/sclera: Conjunctivae normal.  Cardiovascular:     Rate and Rhythm: Normal rate. Rhythm irregular.  Pulmonary:     Effort: Pulmonary effort is normal. No respiratory distress.     Breath sounds: No stridor.  Abdominal:     General: There is no distension.  Musculoskeletal:     Right lower leg: Edema present.     Left lower leg: Edema present.     Comments: 1+ edema bilaterally, with bandaging in place around the mid calf.  No substantial erythema  Skin:    General: Skin is warm and dry.  Neurological:     Mental Status: He is alert and oriented to person, place, and time.     ED Results / Procedures / Treatments   Labs (all labs ordered are listed, but only abnormal results are displayed) Labs Reviewed  COMPREHENSIVE METABOLIC PANEL - Abnormal; Notable for the following components:      Result Value   Glucose, Bld 51 (*)    BUN 45 (*)    Creatinine, Ser 2.89 (*)    Calcium 8.5 (*)    Albumin 3.1 (*)    AST 50 (*)    Total Bilirubin 1.8 (*)    GFR, Estimated 20 (*)    All  other components within normal limits  CBC WITH DIFFERENTIAL/PLATELET - Abnormal; Notable for the following components:   RBC 3.26 (*)    Hemoglobin 10.2 (*)    HCT 33.0 (*)    MCV 101.2 (*)    RDW 16.5 (*)    Platelets 140 (*)    All other components within normal limits  BRAIN NATRIURETIC PEPTIDE - Abnormal; Notable for the following components:   B Natriuretic  Peptide 995.3 (*)    All other components within normal limits  CBG MONITORING, ED - Abnormal; Notable for the following components:   Glucose-Capillary 49 (*)    All other components within normal limits  CBG MONITORING, ED - Abnormal; Notable for the following components:   Glucose-Capillary 22 (*)    All other components within normal limits  CBG MONITORING, ED - Abnormal; Notable for the following components:   Glucose-Capillary 23 (*)    All other components within normal limits  CBG MONITORING, ED - Abnormal; Notable for the following components:   Glucose-Capillary 118 (*)    All other components within normal limits  ETHANOL  URINALYSIS, ROUTINE W REFLEX MICROSCOPIC  CBG MONITORING, ED  CBG MONITORING, ED  CBG MONITORING, ED  CBG MONITORING, ED    EKG EKG Interpretation Date/Time:  Wednesday January 31 2024 09:39:41 EDT Ventricular Rate:  75 PR Interval:    QRS Duration:  174 QT Interval:  509 QTC Calculation: 569 R Axis:   -60  Text Interpretation: Atrial fibrillation IVCD, consider atypical RBBB LVH with secondary repolarization abnormality Baseline wander in lead(s) V4 V5 Confirmed by Gerhard Munch [phone]) on 01/31/2024 12:32:15 PM  Radiology DG Chest Port 1 View Result Date: 01/31/2024 CLINICAL DATA:  Hypoglycemia. EXAM: PORTABLE CHEST 1 VIEW COMPARISON:  08/18/2023 FINDINGS: Decreased inspiration with grossly stable enlargement of the cardiac silhouette. Post CABG changes and aortic calcifications are again demonstrated. No significant change in mild-to-moderate diffuse peribronchial thickening and  accentuation of the interstitial markings without airspace consolidation or pleural fluid. Gas distended gastric fundus. Right neck surgical clips. Moderate left glenohumeral joint degenerative changes. IMPRESSION: 1. No acute abnormality. 2. No significant change in mild-to-moderate chronic bronchitic changes. 3. Stable cardiomegaly. Electronically Signed   By: Beckie Salts M.D.   On: 01/31/2024 13:46    Procedures Procedures    Medications Ordered in ED Medications  furosemide (LASIX) injection 40 mg (has no administration in time range)  sodium chloride 0.9 % bolus 1,000 mL (1,000 mLs Intravenous New Bag/Given 01/31/24 1009)  dextrose 50 % solution 50 mL (50 mLs Intravenous Given 01/31/24 1005)    ED Course/ Medical Decision Making/ A&P                                 Medical Decision Making Patient with history of insulin-dependent diabetes presents with hypoglycemia, broad differential including iatrogenic, medication complication, electrolyte abnormalities, overdiuresis with consideration of infection as well. Cardiac 90 sinus normal pulse ox 100% room air normal  Amount and/or Complexity of Data Reviewed Independent Historian:     Details: Daughter at bedside, EMS Labs: ordered. Decision-making details documented in ED Course. Radiology: ordered and independent interpretation performed. Decision-making details documented in ED Course. ECG/medicine tests: ordered and independent interpretation performed. Decision-making details documented in ED Course.  Risk Prescription drug management.  Patient had glucose of 23, requiring D50.   Update glucose now 93  Diabetes coordinator suggests possible continuous detail, but given the patient's heart failure, fluid status, and presence of family members in the room patient will continue regular Accu-Cheks.  Update: Glucose remains improved following D50.  Initial labs notable for substantial BNP, consistent with concerns for heart  failure, x-ray with cardiomegaly, given his lower extremity edema, this is likely contributing to the patient's abnormalities.  Daughter notes the patient is being prepared for TAVR with catheterization scheduled for later this month. Given persistent hypoglycemia, at home,  here, complicated by patient's acute on chronic heart failure, he will require admission for further monitoring, management.   CRITICAL CARE Performed by: Gerhard Munch Total critical care time: 35 minutes Critical care time was exclusive of separately billable procedures and treating other patients. Critical care was necessary to treat or prevent imminent or life-threatening deterioration. Critical care was time spent personally by me on the following activities: development of treatment plan with patient and/or surrogate as well as nursing, discussions with consultants, evaluation of patient's response to treatment, examination of patient, obtaining history from patient or surrogate, ordering and performing treatments and interventions, ordering and review of laboratory studies, ordering and review of radiographic studies, pulse oximetry and re-evaluation of patient's condition.   Final Clinical Impression(s) / ED Diagnoses Final diagnoses:  Acute on chronic systolic congestive heart failure ([name])  Hypoglycemia     Gerhard Munch, MD 01/31/24 1427

## 2024-01-31 NOTE — Inpatient Diabetes Management (Signed)
 Inpatient Diabetes Program Recommendations  AACE/ADA: New Consensus Statement on Inpatient Glycemic Control (2015)  Target Ranges:  Prepandial:   less than 140 mg/dL      Peak postprandial:   less than 180 mg/dL (1-2 hours)      Critically ill patients:  140 - 180 mg/dL   Lab Results  Component Value Date   GLUCAP 91 01/31/2024   HGBA1C 7.0 (H) 10/30/2023    Review of Glycemic Control  Latest Reference Range & Units 01/31/24 09:33 01/31/24 10:02 01/31/24 10:03 01/31/24 10:32 01/31/24 11:35 01/31/24 11:52 01/31/24 12:09 01/31/24 13:14 01/31/24 14:13  Glucose-Capillary 70 - 99 mg/dL 49 (L) 22 (LL) 23 (LL) 118 (H) 97 86 95 97 91   Diabetes history: DM 2 Outpatient Diabetes medications: Jardiance 10 mg Daily, Lantus 20-25 units (vial and syringe) Current orders for Inpatient glycemic control:  None  Note: Pt last dose of Lantus was around 10 pm last night. Renal function elevated. Glucose could potentially drop for a few more hours until Lantus clears system  Spoke with pt at bedside. Pt lives by himself and checks his glucose daily once in the morning. Pt reports his fasting glucose is usually between 100-105, however the last couple of weeks has been in the 80's and 90's and the last handful of days has been very low in the 40's.   Inpatient Diabetes Program Recommendations:    Pt may need to resume a dextrose gtt but with D10 if less volume is desired with fluid and renal status.  Would recommend lower home dose of basal insulin. It would be permissible for a pt at this age that lives by himself to have a slightly higher A1c to avoid hypoglycemia Pt will check glucose more frequently at home and follow up with Dr. Allena Katz outpt who usually manages his DM.  Thanks,  Christena Deem RN, MSN, BC-ADM Inpatient Diabetes Coordinator Team Pager [phone] (8a-5p)

## 2024-01-31 NOTE — ED Triage Notes (Signed)
 Pt coming in from home where he had a blood sugar 61 then dropped to 53. Pt given graham crackers and peanut butter. Pt given instant glucose by ptar for a sugar of 61. Pt has a hx of aortic stenosis and has wounds bilaterally on legs.   Ems vitals  Bp 124/80 Pulse 84 98% ra

## 2024-01-31 NOTE — H&P (Signed)
 History and Physical    Patient: [name] EXB:[id number] DOB: 04/02/1932 DOA: 01/31/2024 DOS: the patient was seen and examined on 01/31/2024 PCP: Joaquim Nam, MD  Patient coming from: Home - lives alone; NOK: Daughter, [phone]   Chief Complaint: hypoglycemia  HPI: [name] is a 89 y.o. male with medical history significant of macular degeneration,  afib, CAD, stage 4 CKD, chronic systolic CHF, HTN, HLD, DM, and OSA on CPAP who presented on 3/19 with hypoglycemia.  He reports difficulty with blood sugars and "changes in my life, desperation."   Saturday and Sunday, he had n/v/d and was unable to keep anything down.  He had 1 bout of diarrhea Sunday, difficulty eating Sunday-Tuesday.  This AM, he was concerned and they called EMS, glucose 34.  He thought if his sugar was low, he needed more inslin.  Last insulin was about 2200 last night, at least 20 units of Lantus.  He takes 20-25 units daily based on no clear indication.  He has had general malaise for the last 2-3 weeks, glucose dropping a lot and he has been giving himself extra insulin.  He has also had heart failure.  Dr. Allyson Sabal has been following and he is set up for TAVR evaluation next week (3/31 RHC/LHC and angiography).  He feels like he has been doing great but he has been in a decline.    He has noticed rotator cuff issues over the last few weeks, it has been very painful and he is scheduled to see [facility] tomorrow or Friday.    He is considering changing residences to [facility], buying into it.  He is in the process of moving there and it has been very stressful, has been getting new furniture and packing.  His son will be living with him and that is also stressful.    He fell 3 times in the last 2 weeks and had seen the wound center 2 years ago, due to see them again on 3/28 for acute on chronic wound.  His daughter has been treating that.   No fevers.    He is currently unable to get up from seated position  due to knee weakness. Overall strength has declined.  He thinks he will be able to go home with rehab and does not think he will need SNF rehab.    ER Course:  Worsening heart failure despite increasing diuretics.  Also with hypoglycemia today, recurrent.  Glucose currently in 90s.  Likely needs continuous glucose but volume overloaded.  Cardiology consulted.     Review of Systems: As mentioned in the history of present illness. All other systems reviewed and are negative. Past Medical History:  Diagnosis Date   Age-related macular degeneration, wet, both eyes ([name])    Anemia    Secondary to acute blood loss   Anginal pain ([name])    last chest pain in Feb 2021   Arthritis    "mild in hands, knees, ankles" (11/13/2015)   Atrial fibrillation ([name])    Consideration was given for atrial flutter ablation, but patient developed atrial fibrillation. Cardioversion was done. Dr. Graciela Husbands decided to watch him clinically. November, 2011   Atrial flutter [facility]) 07/2010   September, 2011   Hospital with PNA and cath done.Marland KitchenMarland KitchenCoumadin.  Atrial flutter ablation planned, but  pt. then had atrial fibrillation,/outpatient conversion 09/08/10..NSR..plan to follow..Dr. Graciela Husbands   CAD (coronary artery disease)    Catheterization, September, 2011,  grafts patent from redo CABG,, medical therapy of  coronary disease, consideration to proceeding with atrial flutter ablation   Carotid artery disease ([name])    Doppler 09/18/2009 - 49% bilateral stenoses   Carotid artery disease ([name])    49% bilateral, Doppler, November, 2010   CHF (congestive heart failure) ([name])    Chronic kidney disease (CKD), stage III (moderate) ([name])    Diabetic peripheral neuropathy ([name])    feet   Diverticulosis of colon with hemorrhage 2009   several unit diverticular bleed    Erectile dysfunction    Mild   Gout    Hearing loss    wears hearing aids   History of blood transfusion "several"   related to diverticular bleeding    Hyperlipidemia    Hypertension    Mitral regurgitation    Mild, echo, September, 2011   NSTEMI (non-ST elevated myocardial infarction) ([name]) 07/2010   at [facility] with repeat cath, rec medical mgmt    OSA on CPAP    uses CPAP nightly   Personal history of colonic polyps    PNA (pneumonia) 07/2010   NSTEMI at [facility] with repeat cath, rec medical mgmt    RBBB (right bundle branch block)    S/P mitral valve repair 02/06/2020   s/p TEER with a single MitraClip NTW placed on A2/P2   Shoulder pain    "positional; better now" (11/13/2015)   Skin cancer    R lower leg, per derm 2012   Type II diabetes mellitus ([name])    Past Surgical History:  Procedure Laterality Date   CARDIAC CATHETERIZATION  2006   Nuclear..slight lateral ischemia..medical therapy   CARDIAC CATHETERIZATION  08/04/2010   grafts patent from redo CABG...medical Rx and ablate Atrial flutter (LV not injected)    CARDIOVERSION  ~ 2010   CAROTID ENDARTERECTOMY Right 1994   CATARACT EXTRACTION W/ INTRAOCULAR LENS  IMPLANT, BILATERAL Bilateral    CHOLECYSTECTOMY     COLON RESECTION N/A 01/13/2016   Procedure: EXPLORATORY LAPAROTOMY, LEFT AND SIGMOID COLON REMOVAL;  Surgeon: Axel Filler, MD;  Location: [facility] OR;  Service: General;  Laterality: N/A;  Extended open left hemicolectomy and sigmoidectomy    COLONOSCOPY N/A 11/14/2015   Procedure: COLONOSCOPY;  Surgeon: Ruffin Frederick, MD;  Location: [facility] ENDOSCOPY;  Service: Gastroenterology;  Laterality: N/A;   COLONOSCOPY Left 01/05/2016   Procedure: COLONOSCOPY;  Surgeon: Ruffin Frederick, MD;  Location: [facility] ENDOSCOPY;  Service: Gastroenterology;  Laterality: Left;  no sedation to start, moderate if needed   COLONOSCOPY W/ POLYPECTOMY     CORONARY ARTERY BYPASS GRAFT  1995; 2006   "X 3; X3"   CORONARY ARTERY BYPASS GRAFT  1995, 2006   DOPPLER ECHOCARDIOGRAPHY  08/2008   EF 60%   DOPPLER ECHOCARDIOGRAPHY  08/02/2010   65-70%   DOPPLER ECHOCARDIOGRAPHY  07/2010   MR  mild   ESOPHAGOGASTRODUODENOSCOPY N/A 06/19/2014   Procedure: ESOPHAGOGASTRODUODENOSCOPY (EGD);  Surgeon: Beverley Fiedler, MD;  Location: [facility] ENDOSCOPY;  Service: Endoscopy;  Laterality: N/A;   ESOPHAGOGASTRODUODENOSCOPY N/A 11/14/2015   Procedure: ESOPHAGOGASTRODUODENOSCOPY (EGD);  Surgeon: Ruffin Frederick, MD;  Location: [facility] ENDOSCOPY;  Service: Gastroenterology;  Laterality: N/A;   ESOPHAGOGASTRODUODENOSCOPY (EGD) WITH PROPOFOL N/A 08/18/2023   Procedure: ESOPHAGOGASTRODUODENOSCOPY (EGD) WITH PROPOFOL;  Surgeon: Shellia Cleverly, DO;  Location: [facility] ENDOSCOPY;  Service: Gastroenterology;  Laterality: N/A;   FLEXIBLE SIGMOIDOSCOPY N/A 11/16/2015   Procedure: FLEXIBLE SIGMOIDOSCOPY;  Surgeon: Ruffin Frederick, MD;  Location: [facility] ENDOSCOPY;  Service: Gastroenterology;  Laterality: N/A;   LAPAROSCOPIC CHOLECYSTECTOMY  2008   LEFT  HEART CATH N/A 06/14/2014   Procedure: LEFT HEART CATH;  Surgeon: Lesleigh Noe, MD;  Location: [facility] CATH LAB;  Service: Cardiovascular;  Laterality: N/A;   LEFT HEART CATH AND CORONARY ANGIOGRAPHY N/A 08/02/2018   Procedure: LEFT HEART CATH AND CORONARY ANGIOGRAPHY;  Surgeon: Runell Gess, MD;  Location: [facility] INVASIVE CV LAB;  Service: Cardiovascular;  Laterality: N/A;   LEFT HEART CATHETERIZATION WITH CORONARY/GRAFT ANGIOGRAM N/A 06/11/2014   Procedure: LEFT HEART CATHETERIZATION WITH Isabel Caprice;  Surgeon: Lesleigh Noe, MD;  Location: [facility] CATH LAB;  Service: Cardiovascular;  Laterality: N/A;   PERCUTANEOUS CORONARY STENT INTERVENTION (PCI-S) N/A 06/13/2014   Procedure: PERCUTANEOUS CORONARY STENT INTERVENTION (PCI-S);  Surgeon: Lesleigh Noe, MD;  Location: [facility] CATH LAB;  Service: Cardiovascular;  Laterality: N/A;   RIGHT HEART CATH N/A 01/06/2020   Procedure: RIGHT HEART CATH;  Surgeon: Dolores Patty, MD;  Location: [facility] INVASIVE CV LAB;  Service: Cardiovascular;  Laterality: N/A;   SKIN CANCER EXCISION Left 10/2015   calf   SKIN  CANCER EXCISION Right 2014?   chest   TEE WITHOUT CARDIOVERSION N/A 01/06/2020   Procedure: TRANSESOPHAGEAL ECHOCARDIOGRAM (TEE);  Surgeon: Dolores Patty, MD;  Location: [facility] ENDOSCOPY;  Service: Cardiovascular;  Laterality: N/A;   TONSILLECTOMY     TRANSCATHETER MITRAL EDGE TO EDGE REPAIR N/A 02/06/2020   Procedure: MITRAL VALVE REPAIR;  Surgeon: Tonny Bollman, MD;  Location: [facility] INVASIVE CV LAB;  Service: Cardiovascular;  Laterality: N/A;   VASECTOMY     Social History:  reports that he quit smoking about 67 years ago. His smoking use included cigarettes. He started smoking about 75 years ago. He has a 8 pack-year smoking history. He has never used smokeless tobacco. He reports that he does not drink alcohol and does not use drugs.  Allergies  Allergen Reactions   Nsaids     CKD    Family History  Problem Relation Age of Onset   Kidney disease Mother        Kidney failure   Stroke Mother    Diabetes Mother    Heart disease Father        MI   Arthritis Sister    Cancer Sister        Throat   Heart disease Sister        MI   Diabetes Sister    Prostate cancer Neg Hx    Colon cancer Neg Hx     Prior to Admission medications   Medication Sig Start Date End Date Taking? Authorizing Provider  allopurinol (ZYLOPRIM) 100 MG tablet TAKE ONE-HALF (1/2) TABLET EVERY MONDAY, [facility] AND FRIDAY 01/13/23   Joaquim Nam, MD  amoxicillin (AMOXIL) 500 MG tablet Take by mouth. Patient takes 4 tablets prior to going to the dentist 12/28/21   [provider]  aspirin 81 MG tablet Take 81 mg by mouth daily.    [provider]  BD INSULIN SYRINGE ULTRAFINE 31G X 5/16" 0.3 ML MISC USE DAILY AS INSTRUCTED 12/02/15   Joaquim Nam, MD  Cholecalciferol (VITAMIN D) 1000 UNITS capsule Take 1,000 Units by mouth daily.     [provider]  diclofenac Sodium (VOLTAREN) 1 % GEL Apply 2 g topically 4 (four) times daily. 07/20/20   Janace Aris, FNP  ELIQUIS 2.5 MG TABS  tablet TAKE 1 TABLET TWICE A DAY 12/04/23   Runell Gess, MD  empagliflozin (JARDIANCE) 10 MG TABS tablet Take 1 tablet (10  mg total) by mouth daily. 10/06/23   Runell Gess, MD  fish oil-omega-3 fatty acids 1000 MG capsule Take 1 g by mouth daily.    [provider]  glucose blood (FREESTYLE LITE) test strip USE TO TEST BLOOD SUGAR ONCE DAILY AND AS NEEDED 11/23/23   Joaquim Nam, MD  hydrALAZINE (APRESOLINE) 25 MG tablet Take 1 tablet (25 mg total) by mouth daily. 12/02/22   Runell Gess, MD  HYDROcodone-acetaminophen (NORCO/VICODIN) 5-325 MG tablet Take 0.5-1 tablets by mouth 2 (two) times daily as needed for moderate pain (pain score 4-6). 01/26/24   Joaquim Nam, MD  ipratropium (ATROVENT) 0.03 % nasal spray USE 2 SPRAYS IN EACH NOSTRIL TWICE A DAY AS NEEDED FOR RHINITIS 03/06/23   Joaquim Nam, MD  isosorbide mononitrate (IMDUR) 60 MG 24 hr tablet Take 1.5 tablets (90 mg total) by mouth daily. 12/19/23   Runell Gess, MD  LANTUS 100 UNIT/ML injection INJECT 20 TO 25 UNITS AT BEDTIME 09/11/23   Joaquim Nam, MD  magnesium oxide (MAG-OX) 400 MG tablet Take 1 tablet (400 mg total) by mouth every other day. 04/13/23   Joaquim Nam, MD  metoprolol tartrate (LOPRESSOR) 50 MG tablet TAKE 1 TABLET(50 MG) BY MOUTH TWICE DAILY 02/22/23   Runell Gess, MD  Multiple Vitamin (MULTIVITAMIN WITH MINERALS) TABS Take 1 tablet by mouth daily.    [provider]  nitroGLYCERIN (NITROSTAT) 0.4 MG SL tablet DISSOLVE 1 TABLET UNDER THE TONGUE EVERY 5 MINUTES AS NEEDED FOR CHEST PAIN FOR A MAXIMUM OF 3 DOSES 07/01/22   Runell Gess, MD  pantoprazole (PROTONIX) 40 MG tablet Take 1 tablet (40 mg total) by mouth 2 (two) times daily for 42 days, THEN 1 tablet (40 mg total) daily. 08/25/23 12/05/23  Joaquim Nam, MD  polyethylene glycol (MIRALAX / GLYCOLAX) 17 g packet Take 17 g by mouth daily.    [provider]  Potassium Gluconate 595 MG CAPS Take 595  mg by mouth daily.    [provider]  ranolazine (RANEXA) 500 MG 12 hr tablet TAKE 1 TABLET TWICE A DAY 03/08/23   Runell Gess, MD  rosuvastatin (CRESTOR) 10 MG tablet Take 1 tablet (10 mg total) by mouth daily. 03/01/23   Tereso Newcomer T, PA-C  torsemide (DEMADEX) 20 MG tablet TAKE 2 TABLETS IN THE MORNING AND 1 TABLET IN THE EVENING 01/17/24   Tonny Bollman, MD  valACYclovir (VALTREX) 1000 MG tablet Take by mouth. Used as needed for a cold sore. 08/30/22   [provider]    Physical Exam: Vitals:   01/31/24 1400 01/31/24 1405 01/31/24 1530 01/31/24 1631  BP: (!) 116/92  (!) 125/108   Pulse: 69  (!) 113   Resp: 14  18   Temp:  97.6 F (36.4 C)  98 F (36.7 C)  TempSrc:  Oral  Oral  SpO2: 100%  100%   Weight:      Height:       General:  Appears calm and comfortable and is in NAD, on RA Eyes:  PERRL, EOMI, normal lids, iris ENT:  grossly normal hearing, lips & tongue, mmm Neck:  no LAD, masses or thyromegaly Cardiovascular:  RRR, no m/r/g. 1-2+ LE edema, better than baseline.  Respiratory:   CTA bilaterally with no wheezes/rales/rhonchi.  Normal respiratory effort. Abdomen:  soft, NT, ND Skin:  small ulcerative lesions with clear base and no surrounding edema on B anterior lower legs, stasis  dermatitis  Musculoskeletal:  grossly normal tone BUE/BLE, good ROM, no bony abnormality Psychiatric:  grossly normal mood and affect, speech fluent and appropriate, AOx3 Neurologic:  CN 2-12 grossly intact, moves all extremities in coordinated fashion   Radiological Exams on Admission: Independently reviewed - see discussion in A/P where applicable  DG Chest Port 1 View Result Date: 01/31/2024 CLINICAL DATA:  Hypoglycemia. EXAM: PORTABLE CHEST 1 VIEW COMPARISON:  08/18/2023 FINDINGS: Decreased inspiration with grossly stable enlargement of the cardiac silhouette. Post CABG changes and aortic calcifications are again demonstrated. No significant change in  mild-to-moderate diffuse peribronchial thickening and accentuation of the interstitial markings without airspace consolidation or pleural fluid. Gas distended gastric fundus. Right neck surgical clips. Moderate left glenohumeral joint degenerative changes. IMPRESSION: 1. No acute abnormality. 2. No significant change in mild-to-moderate chronic bronchitic changes. 3. Stable cardiomegaly. Electronically Signed   By: Beckie Salts M.D.   On: 01/31/2024 13:46    EKG: Independently reviewed.  NSR with rate 75; prolonged QTc 569; LVH, IVCD   Labs on Admission: I have personally reviewed the available labs and imaging studies at the time of the admission.  Pertinent labs:     Glucose 51, 22, 23, 118, 97, 86, 97, 91 BUN 45/Creatinine 2.89/GFR 20 Albumin 3.1 AST 50/ALT 21/Bili 1.8 BNP 995.3 WBC 5.6 Hgb 10.2 Glucose 140   Assessment and Plan: Principal Problem:   Hypoglycemia associated with diabetes ([name]) Active Problems:   Permanent atrial fibrillation ([name])   CKD (chronic kidney disease) stage 4, GFR 15-29 ml/min ([name])   HTN (hypertension)   HLD (hyperlipidemia)   Sleep apnea   Chronic systolic CHF (congestive heart failure) ([name])   DNR (do not resuscitate)    Recurrent hypoglycemia Patient reports taking "at least 20 units" of Lantus daily but more depending on ? (No apparent reason provided) IN the setting of acute GI illness last weekend, he was unable to eat but continued to take Lantus and actually increased the dose Hold Lantus - last dose last night Continue sensitive-scale SSI for now Hold Jardiance Will request DM coordinator consult for education  Chronic HFrEF 12/13/2023 echo with EF 39035%, moderate LBH He appears compensated; suspect that his GI illness allowed him to effectively diurese over the weekend He was given a dose of IV Lasix in the ER for concern of volume overload but will not give any additional IV Lasix at this time Cardiology is consulting Resume  home torsemide Continue home hydralazine and Imdur Cardiology is transitioning him to Toprol XL from short-acting metoprolol  CAD s/p CABG Continue ASA for now, although may be able to stop since he is on Eliquis Continue Imdur, Ranexa, BB  Afib Rate controlled with BB Eliquis held for cath on 3/21  Severe AS Scheduled for LHC/RHC and angiography on 3/31 but cardiology will move up to 3/21 to accommodate while here given his excellent current volume status He is planned for upcoming TAR  Mitral regurg  S/p MtriClin in 2021  HTN Continue hydralazine, metoprolol  HLD Continue rosuvstatin  Stage 4 CKD Appears to be stable at this time, baseline 2.7-3.1 Attempt to avoid nephrotoxic medications He has been taking ibuprofen at home and would recommend stopping this medication Recheck BMP in AM   OSA Continue CPAP  Physical deconditioning Reports recent falls Thinks he needs PT/OT If possible, would recommend HH therapies at this time so that he will qualify for SNF rehab if needed with TAVR soon Continue Norco for pain; PDMP reviewed and appropriate (  this is a new medication from PCP)  DNR Patient and family confirmed   Advance Care Planning:   Code Status: Limited: Do not attempt resuscitation (DNR) -DNR-LIMITED -Do Not Intubate/DNI    Consults: Cardiology, PT, OT, TOC team  DVT Prophylaxis: SCDs  Family Communication: Daughter and son were present throughout evaluation  Severity of Illness: The appropriate patient status for this patient is OBSERVATION. Observation status is judged to be reasonable and necessary in order to provide the required intensity of service to ensure the patient's safety. The patient's presenting symptoms, physical exam findings, and initial radiographic and laboratory data in the context of their medical condition is felt to place them at decreased risk for further clinical deterioration. Furthermore, it is anticipated that the patient will  be medically stable for discharge from the hospital within 2 midnights of admission.   Author: Jonah Blue, MD 01/31/2024 5:43 PM  For on call review [email or web address].

## 2024-01-31 NOTE — Consult Note (Addendum)
 Cardiology Consultation   Patient ID: [name] MRN: [id number]; DOB: [birth year]  Admit date: 01/31/2024 Date of Consult: 01/31/2024  PCP:  Jeremiah Nam, MD   [name] Providers Cardiologist:  Jeremiah Batty, MD        Patient Profile:   [name] is a 89 y.o. male with a history of CAD s/p multiple interventions (CABG in 1996, redo CABG in 2006, and PCI with DES to SVG-OM graft in 05/2014), Plavix non-responder, chronic HFrEF with EF of 30-35% in 11/2023, permanent atrial fibrillation on Eliquis, mitral regurgitation s/p MitraClip in 01/2020 with mild residual regurgitation, severe low flow low gradient aortic stenosis (currently being worked up for TAVR), bilateral carotid stenosis s/p right CEA in 1994, hypertension, hyperlipidemia, CKD stage IIIb, obstructive sleep apnea, and GI bleeding s/p left sided hemicolectomy and sigmoidectomy who is being seen 01/31/2024 for the evaluation of acute on chronic CHF at the request of [name].  History of Present Illness:   [name] is a 89 year old male with a complex history as detailed above who follows with [name]. He has a history of CAD with remote CABG in 1996 and then redo CABG in 2006. He underwent PCI with DES to SVG-OM graft in 2015. Last cardiac catheterization in 07/2018 showed occluded SVG to distal RCA but patent SVG to LAD and SVG to OM as well as 100% stenosis of proximal LAD, 100% stenosis of ostial RCA, 90% stenosis of OM1, and 70% stenosis of ostial LCX followed by 60% stenosis of proximal LCX. The occluded SVG to RCA graft was a new finding but he had collaterals. Continued medical therapy was recommended. He also has a history of chronic HFrEF, permanent atrial fibrillation, and valvular disease involving the mitral and aortic valves. He underwent MitraClip in 01/2020. Last Echo in 11/2023 showed LVEF of 30-35% with global hypokinesis and moderate LVH, moderate RV dysfunction with moderately elevated  PASP of 54.7 mmHg, stable MitraClip with mild residual mitral regurgitation (mean gradient ), and moderate to severe low flow low gradient aortic stenosis. He was referred to [facility] and recently seen by [name] on 01/08/2024. At that time, he reported progressive dyspnea on exertion, exercise intolerance, and fatigue as well well as occasional chest pain but this was stable for him. Decision was to proceed with TAVR work-up with the understanding that L/HC and TAVR CTAs would need to be spread out given underlying CKD stage IIIb and risk of AKI and contrast nephropathy. R/ LHC was scheduled for 02/12/2024.  Patient presented to the ED today for further evaluation of hypoglycemia with blood sugar as low as 53 prior to arrival. Upon arrival to the ED, vitals stable. EKG showed atrial fibrillation, rate 75 bpm, with known LVH and RBBB but no acute ischemic changes. WBC 5.6, Hgb 10.2, Plts 140. Na 137, K 3.7, Glucose 51, BUN 45, Cr 2.89. Albumin 3.1, AST 50, ALT 21, Alk Phos 69, Total Bili 1.8. BNP 995. ETOH normal. Chest x-ray showed stable cardiomegaly but no acute findings. Patient is to be admitted for persistent hypoglycemia complicated by acute on chronic CHF. Cardiology consulted for assistance with CHF.  At the time of this evaluation, patient is resting comfortably in no acute distress. He came down with what sounds like a viral GI bug on 3/15 (his son had similar symptoms). He reports nausea, multiple episodes of emesis, diarrhea, and poor PO intake since then. With this, he had increased weakness and  shaking as well as multiple falls in which it sounds like he hit is legs and then softly hit his head on the ground. He continued to take his home insulin over the last few days and then developed hypoglycemia. Overall, it sounds like he has been stable from a cardiac standpoint since seeing [name] about 3 weeks ago. He has chronic dyspnea on exertion after walking about 200  feet as well as decreased exercise tolerance and exertional fatigue. He states his dyspnea may be a little bit worse but overall stable. He has occasional episodes of chest pain at baseline but none recently. He reports he has only needed his sublingual Nitroglycerin 3-4 times in the last year and reports he last used this a couple of months ago. No orthopnea or PND. He has chronic lower extremity edema but states this is actually better than it has been over the last coupe of weeks. No URI symptoms. No abnormal bleeding in urine or stools.  Past Medical History:  Diagnosis Date   Age-related macular degeneration, wet, both eyes ([name])    Anemia    Secondary to acute blood loss   Anginal pain ([name])    last chest pain in Feb 2021   Arthritis    "mild in hands, knees, ankles" (11/13/2015)   Atrial fibrillation ([name])    Consideration was given for atrial flutter ablation, but patient developed atrial fibrillation. Cardioversion was done. Dr. Graciela Husbands decided to watch him clinically. November, 2011   Atrial flutter [facility]) 07/2010   September, 2011   Hospital with PNA and cath done.Marland KitchenMarland KitchenCoumadin.  Atrial flutter ablation planned, but  pt. then had atrial fibrillation,/outpatient conversion 09/08/10..NSR..plan to follow..Dr. Graciela Husbands   CAD (coronary artery disease)    Catheterization, September, 2011,  grafts patent from redo CABG,, medical therapy of coronary disease, consideration to proceeding with atrial flutter ablation   Carotid artery disease ([name])    Doppler 09/18/2009 - 49% bilateral stenoses   Carotid artery disease ([name])    49% bilateral, Doppler, November, 2010   CHF (congestive heart failure) ([name])    Chronic kidney disease (CKD), stage III (moderate) ([name])    Diabetic peripheral neuropathy ([name])    feet   Diverticulosis of colon with hemorrhage 2009   several unit diverticular bleed    Erectile dysfunction    Mild   Gout    Hearing loss    wears hearing aids   History of blood transfusion  "several"   related to diverticular bleeding   Hyperlipidemia    Hypertension    Mitral regurgitation    Mild, echo, September, 2011   NSTEMI (non-ST elevated myocardial infarction) ([name]) 07/2010   at [facility] with repeat cath, rec medical mgmt    OSA on CPAP    uses CPAP nightly   Personal history of colonic polyps    PNA (pneumonia) 07/2010   NSTEMI at [facility] with repeat cath, rec medical mgmt    RBBB (right bundle branch block)    S/P mitral valve repair 02/06/2020   s/p TEER with a single MitraClip NTW placed on A2/P2   Shoulder pain    "positional; better now" (11/13/2015)   Skin cancer    R lower leg, per derm 2012   Type II diabetes mellitus ([name])     Past Surgical History:  Procedure Laterality Date   CARDIAC CATHETERIZATION  2006   Nuclear..slight lateral ischemia..medical therapy   CARDIAC CATHETERIZATION  08/04/2010   grafts patent from redo CABG...medical Rx and ablate  Atrial flutter (LV not injected)    CARDIOVERSION  ~ 2010   CAROTID ENDARTERECTOMY Right 1994   CATARACT EXTRACTION W/ INTRAOCULAR LENS  IMPLANT, BILATERAL Bilateral    CHOLECYSTECTOMY     COLON RESECTION N/A 01/13/2016   Procedure: EXPLORATORY LAPAROTOMY, LEFT AND SIGMOID COLON REMOVAL;  Surgeon: Axel Filler, MD;  Location: [facility] OR;  Service: General;  Laterality: N/A;  Extended open left hemicolectomy and sigmoidectomy    COLONOSCOPY N/A 11/14/2015   Procedure: COLONOSCOPY;  Surgeon: Ruffin Frederick, MD;  Location: [facility] ENDOSCOPY;  Service: Gastroenterology;  Laterality: N/A;   COLONOSCOPY Left 01/05/2016   Procedure: COLONOSCOPY;  Surgeon: Ruffin Frederick, MD;  Location: [facility] ENDOSCOPY;  Service: Gastroenterology;  Laterality: Left;  no sedation to start, moderate if needed   COLONOSCOPY W/ POLYPECTOMY     CORONARY ARTERY BYPASS GRAFT  1995; 2006   "X 3; X3"   CORONARY ARTERY BYPASS GRAFT  1995, 2006   DOPPLER ECHOCARDIOGRAPHY  08/2008   EF 60%   DOPPLER ECHOCARDIOGRAPHY  08/02/2010    65-70%   DOPPLER ECHOCARDIOGRAPHY  07/2010   MR mild   ESOPHAGOGASTRODUODENOSCOPY N/A 06/19/2014   Procedure: ESOPHAGOGASTRODUODENOSCOPY (EGD);  Surgeon: Beverley Fiedler, MD;  Location: [facility] ENDOSCOPY;  Service: Endoscopy;  Laterality: N/A;   ESOPHAGOGASTRODUODENOSCOPY N/A 11/14/2015   Procedure: ESOPHAGOGASTRODUODENOSCOPY (EGD);  Surgeon: Ruffin Frederick, MD;  Location: [facility] ENDOSCOPY;  Service: Gastroenterology;  Laterality: N/A;   ESOPHAGOGASTRODUODENOSCOPY (EGD) WITH PROPOFOL N/A 08/18/2023   Procedure: ESOPHAGOGASTRODUODENOSCOPY (EGD) WITH PROPOFOL;  Surgeon: Shellia Cleverly, DO;  Location: [facility] ENDOSCOPY;  Service: Gastroenterology;  Laterality: N/A;   FLEXIBLE SIGMOIDOSCOPY N/A 11/16/2015   Procedure: FLEXIBLE SIGMOIDOSCOPY;  Surgeon: Ruffin Frederick, MD;  Location: [facility] ENDOSCOPY;  Service: Gastroenterology;  Laterality: N/A;   LAPAROSCOPIC CHOLECYSTECTOMY  2008   LEFT HEART CATH N/A 06/14/2014   Procedure: LEFT HEART CATH;  Surgeon: Lesleigh Noe, MD;  Location: [facility] CATH LAB;  Service: Cardiovascular;  Laterality: N/A;   LEFT HEART CATH AND CORONARY ANGIOGRAPHY N/A 08/02/2018   Procedure: LEFT HEART CATH AND CORONARY ANGIOGRAPHY;  Surgeon: Runell Gess, MD;  Location: [facility] INVASIVE CV LAB;  Service: Cardiovascular;  Laterality: N/A;   LEFT HEART CATHETERIZATION WITH CORONARY/GRAFT ANGIOGRAM N/A 06/11/2014   Procedure: LEFT HEART CATHETERIZATION WITH Isabel Caprice;  Surgeon: Lesleigh Noe, MD;  Location: [facility] CATH LAB;  Service: Cardiovascular;  Laterality: N/A;   PERCUTANEOUS CORONARY STENT INTERVENTION (PCI-S) N/A 06/13/2014   Procedure: PERCUTANEOUS CORONARY STENT INTERVENTION (PCI-S);  Surgeon: Lesleigh Noe, MD;  Location: [facility] CATH LAB;  Service: Cardiovascular;  Laterality: N/A;   RIGHT HEART CATH N/A 01/06/2020   Procedure: RIGHT HEART CATH;  Surgeon: Dolores Patty, MD;  Location: [facility] INVASIVE CV LAB;  Service: Cardiovascular;  Laterality: N/A;    SKIN CANCER EXCISION Left 10/2015   calf   SKIN CANCER EXCISION Right 2014?   chest   TEE WITHOUT CARDIOVERSION N/A 01/06/2020   Procedure: TRANSESOPHAGEAL ECHOCARDIOGRAM (TEE);  Surgeon: Dolores Patty, MD;  Location: [facility] ENDOSCOPY;  Service: Cardiovascular;  Laterality: N/A;   TONSILLECTOMY     TRANSCATHETER MITRAL EDGE TO EDGE REPAIR N/A 02/06/2020   Procedure: MITRAL VALVE REPAIR;  Surgeon: Tonny Bollman, MD;  Location: [facility] INVASIVE CV LAB;  Service: Cardiovascular;  Laterality: N/A;   VASECTOMY       Home Medications:  Prior to Admission medications   Medication Sig Start Date End Date Taking? Authorizing Provider  allopurinol (ZYLOPRIM) 100 MG tablet  TAKE ONE-HALF (1/2) TABLET EVERY MONDAY, [facility] AND FRIDAY Patient taking differently: Take 50 mg by mouth every Monday, Wednesday, and Friday. 01/13/23  Yes Jeremiah Nam, MD  aspirin 81 MG tablet Take 81 mg by mouth in the morning.   Yes [provider]  Cholecalciferol (VITAMIN D) 1000 UNITS capsule Take 1,000 Units by mouth at bedtime.   Yes [provider]  ELIQUIS 2.5 MG TABS tablet TAKE 1 TABLET TWICE A DAY 12/04/23  Yes Runell Gess, MD  empagliflozin (JARDIANCE) 10 MG TABS tablet Take 1 tablet (10 mg total) by mouth daily. Patient taking differently: Take 10 mg by mouth at bedtime. 10/06/23  Yes Runell Gess, MD  fish oil-omega-3 fatty acids 1000 MG capsule Take 1 g by mouth at bedtime.   Yes [provider]  hydrALAZINE (APRESOLINE) 25 MG tablet Take 1 tablet (25 mg total) by mouth daily. Patient taking differently: Take 25 mg by mouth at bedtime. 12/02/22  Yes Runell Gess, MD  HYDROcodone-acetaminophen (NORCO/VICODIN) 5-325 MG tablet Take 0.5-1 tablets by mouth 2 (two) times daily as needed for moderate pain (pain score 4-6). 01/26/24  Yes Jeremiah Nam, MD  ipratropium (ATROVENT) 0.03 % nasal spray USE 2 SPRAYS IN EACH NOSTRIL TWICE A DAY AS NEEDED FOR RHINITIS Patient  taking differently: Place 2 sprays into both nostrils as needed for rhinitis. 03/06/23  Yes Jeremiah Nam, MD  isosorbide mononitrate (IMDUR) 60 MG 24 hr tablet Take 1.5 tablets (90 mg total) by mouth daily. Patient taking differently: Take 90 mg by mouth at bedtime. 12/19/23  Yes Runell Gess, MD  LANTUS 100 UNIT/ML injection INJECT 20 TO 25 UNITS AT BEDTIME Patient taking differently: Inject 20-25 Units into the skin at bedtime. 09/11/23  Yes Jeremiah Nam, MD  magnesium oxide (MAG-OX) 400 MG tablet Take 1 tablet (400 mg total) by mouth every other day. Patient taking differently: Take 400 mg by mouth every Monday, Wednesday, and Friday. Nights 04/13/23  Yes Jeremiah Nam, MD  metoprolol tartrate (LOPRESSOR) 50 MG tablet TAKE 1 TABLET(50 MG) BY MOUTH TWICE DAILY 02/22/23  Yes Runell Gess, MD  nitroGLYCERIN (NITROSTAT) 0.4 MG SL tablet DISSOLVE 1 TABLET UNDER THE TONGUE EVERY 5 MINUTES AS NEEDED FOR CHEST PAIN FOR A MAXIMUM OF 3 DOSES Patient taking differently: Place 0.4 mg under the tongue every 5 (five) minutes as needed for chest pain. 07/01/22  Yes Runell Gess, MD  pantoprazole (PROTONIX) 40 MG tablet Take 1 tablet (40 mg total) by mouth 2 (two) times daily for 42 days, THEN 1 tablet (40 mg total) daily. Patient taking differently:  THEN 1 tablet (40 mg total) by mouth every morning. 08/25/23 01/31/24 Yes Jeremiah Nam, MD  polyethylene glycol (MIRALAX / GLYCOLAX) 17 g packet Take 17 g by mouth daily.   Yes [provider]  Potassium Gluconate 595 MG CAPS Take 595 mg by mouth in the morning.   Yes [provider]  rosuvastatin (CRESTOR) 10 MG tablet Take 1 tablet (10 mg total) by mouth daily. Patient taking differently: Take 10 mg by mouth in the morning. 03/01/23  Yes Weaver, Scott T, PA-C  torsemide (DEMADEX) 20 MG tablet TAKE 2 TABLETS IN THE MORNING AND 1 TABLET IN THE EVENING Patient taking differently: Take 20-40 mg by mouth See admin instructions.  Take 2 tablets by mouth in the morning and then take 1 tablet by mouth in the evening. 01/17/24  Yes Tonny Bollman, MD  valACYclovir (VALTREX) 1000 MG tablet Take by mouth. Used as needed for a cold sore. 08/30/22  Yes [provider]  amoxicillin (AMOXIL) 500 MG tablet Take by mouth. Patient takes 4 tablets prior to going to the dentist Patient not taking: Reported on 01/31/2024 12/28/21   [provider]  BD INSULIN SYRINGE ULTRAFINE 31G X 5/16" 0.3 ML MISC USE DAILY AS INSTRUCTED 12/02/15   Jeremiah Nam, MD  glucose blood (FREESTYLE LITE) test strip USE TO TEST BLOOD SUGAR ONCE DAILY AND AS NEEDED 11/23/23   Jeremiah Nam, MD  Multiple Vitamin (MULTIVITAMIN WITH MINERALS) TABS Take 1 tablet by mouth daily.    [provider]  ranolazine (RANEXA) 500 MG 12 hr tablet TAKE 1 TABLET TWICE A DAY 03/08/23   Runell Gess, MD    Inpatient Medications: Scheduled Meds:  apixaban  2.5 mg Oral BID   insulin aspart  0-5 Units Subcutaneous QHS   insulin aspart  0-6 Units Subcutaneous TID WC   sodium chloride flush  3 mL Intravenous Q12H   Continuous Infusions:  PRN Meds: acetaminophen **OR** acetaminophen, bisacodyl, hydrALAZINE, polyethylene glycol  Allergies:    Allergies  Allergen Reactions   Nsaids     CKD    Social History:   Social History   Socioeconomic History   Marital status: Widowed    Spouse name: Not on file   Number of children: 2   Years of education: Not on file   Highest education level: Not on file  Occupational History   Occupation: Retired Surveyor, minerals. Rep. Equities trader: RETIRED  Tobacco Use   Smoking status: Former    Current packs/day: 0.00    Average packs/day: 1 pack/day for 8.0 years (8.0 ttl pk-yrs)    Types: Cigarettes    Start date: 52    Quit date: 1958    Years since quitting: 67.2   Smokeless tobacco: Never   Tobacco comments:    "quit smoking cigarettes in 1958"  Vaping Use   Vaping status: Never  Used  Substance and Sexual Activity   Alcohol use: No    Alcohol/week: 0.0 standard drinks of alcohol   Drug use: No   Sexual activity: Yes  Other Topics Concern   Not on file  Social History Narrative   From [place].  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     Lives alone.    Social Drivers of Corporate investment banker Strain: Low Risk  (10/31/2023)   Overall Financial Resource Strain (CARDIA)    Difficulty of Paying Living Expenses: Not hard at all  Food Insecurity: No Food Insecurity (10/31/2023)   Hunger Vital Sign    Worried About Running Out of Food in the Last Year: Never true    Ran Out of Food in the Last Year: Never true  Transportation Needs: No Transportation Needs (10/31/2023)   PRAPARE - Administrator, Civil Service (Medical): No    Lack of Transportation (Non-Medical): No  Physical Activity: Inactive (10/31/2023)   Exercise Vital Sign    Days of Exercise per Week: 0 days    Minutes of Exercise per Session: 0 min  Stress: No Stress Concern Present (10/31/2023)   [employer] of Occupational Health - Occupational Stress Questionnaire    Feeling of Stress : Not at all  Social Connections: Moderately Isolated (10/31/2023)   Social Connection and Isolation Panel [NHANES]    Frequency of Communication  with Friends and Family: More than three times a week    Frequency of Social Gatherings with Friends and Family: More than three times a week    Attends Religious Services: Never    Database administrator or Organizations: Yes    Attends Banker Meetings: 1 to 4 times per year    Marital Status: Widowed  Intimate Partner Violence: Not At Risk (10/31/2023)   Humiliation, Afraid, Rape, and Kick questionnaire    Fear of Current or Ex-Partner: No    Emotionally Abused: No    Physically Abused: No    Sexually Abused: No    Family History:   Family History  Problem Relation Age of Onset   Kidney disease Mother         Kidney failure   Stroke Mother    Diabetes Mother    Heart disease Father        MI   Arthritis Sister    Cancer Sister        Throat   Heart disease Sister        MI   Diabetes Sister    Prostate cancer Neg Hx    Colon cancer Neg Hx      ROS:  Please see the history of present illness.   Physical Exam/Data:   Vitals:   01/31/24 1315 01/31/24 1400 01/31/24 1405 01/31/24 1530  BP:  (!) 116/92  (!) 125/108  Pulse: 69 69  (!) 113  Resp: 13 14  18   Temp:   97.6 F (36.4 C)   TempSrc:   Oral   SpO2: 100% 100%  100%  Weight:      Height:       No intake or output data in the 24 hours ending 01/31/24 1629    01/31/2024    9:34 AM 01/23/2024   11:42 AM 01/09/2024    2:42 PM  Last 3 Weights  Weight (lbs) 154 lb 9.6 oz -- 165 lb  Weight (kg) 70.126 kg -- 74.844 kg     Body mass index is 24.21 kg/m.  General: 89 y.o. Caucasian male resting comfortably in no acute distress. HEENT: Normocephalic and atraumatic. Sclera clear.  Neck: Supple. Distended external jugular vein. Heart: Irregularly irregular rhythm with normal rate. II/VI systolic murmur.  Lungs: No increased work of breathing. Very faint crackles noted in left base. Otherwise, clear to auscultation. No wheezes, rhonchi, or rales.  Abdomen: Soft, non-distended, and non-tender to palpation.  Extremities: 2+ pitting edema of bilateral lower extremities (left > right). Skin: Warm and dry. Neuro: Alert and oriented x3. No focal deficits. Psych: Normal affect. Responds appropriately.   EKG:  The EKG was personally reviewed and demonstrates:  Atrial fibrillation, rate 75 bpm, with known LVH and RBBB but no acute ischemic changes. Telemetry:  Telemetry was personally reviewed and demonstrates: Atrial fibrillation with rates in the 60s to 80s. Occasional PVCs.  Relevant CV Studies:  Left Cardiac Catheterization 08/02/2018: Ost RCA to Prox RCA lesion is 100% stenosed. Ost LAD to Prox LAD lesion is 100%  stenosed. Ost Cx lesion is 70% stenosed. Ost 1st Mrg lesion is 90% stenosed. Prox Cx lesion is 60% stenosed. Origin to Prox Graft lesion is 100% stenosed. Previously placed Origin to Mid Graft stent (unknown type) is widely patent.  Impression: Mr. Rinehart has an occluded RCA vein graft with left-to-right collaterals which is a new finding since his last cath 4 years ago. Proximal circumflex and obtuse marginal branch have progressed  but unfortunately was not able to cross the obtuse marginal branch ostium. This came out from the AV groove had a right angle and was high-grade and calcified. At this point, I recommend aggressive antianginal medical therapy. Angiomax was discontinued. Sheath will be removed and pressure held. Patient be hydrated and discharged home in the morning. He left the lab in stable condition.   Diagnostic Dominance: Right    _______________  Echocardiogram 12/13/2023:  1. Left ventricular ejection fraction, by estimation, is 30 to 35%. The  left ventricle has moderately decreased function. The left ventricle  demonstrates global hypokinesis. There is moderate concentric left  ventricular hypertrophy. Left ventricular  diastolic function could not be evaluated.   2. Right ventricular systolic function is moderately reduced. The right  ventricular size is normal. There is moderately elevated pulmonary artery  systolic pressure. The estimated right ventricular systolic pressure is  54.7 mmHg.   3. The mitral valve has been repaired/replaced. There is a single  MitraClip NTW placed on A2/P2 Mitra-Clip present in the mitral position.  Procedure Date: 02/06/2020. The mitra Clip is well seated. The mean MVG is  consistent with mild mitral  stenosis. Mild mitral regurgitation is present.   4. The aortic valve is calcified. There is severe calcifcation of the  aortic valve. There is severe thickening of the aortic valve. Aortic valve  regurgitation is mild. Aortic  valve mean gradient measures 12.0 mmHg.  Aortic valve Vmax measures 2.42 m/s.  low DVI 0.22 and SVI 20. Despite a low AVG and Vmax, all findings suggest  at least moderate and likely moderate to severe low flow low gradient  aortic stenosis in setting of HFrEF.   5. The inferior vena cava is dilated in size with <50% respiratory  variability, suggesting right atrial pressure of 15 mmHg.   6. Ascending aorta measurements are within normal limits for age when  indexed to body surface area.   7. Evidence of residual atrial level shunting detected by color flow  Doppler from prior transseptal puncture site .   8. Compared to prior study dated 11/24/22, the mean AVG has increased from  to , Vmax has icnreased from 1.43m/s to 2.25m/s, DVI has  decreased from 0.34 to 0.22. Now at least moderate and likely moderate to  severe low flow low gradient Aortic  stenosis. Stable Mitra-Clip with minimal increase in mean MVG from 4 to  .   Laboratory Data:  High Sensitivity Troponin:  No results for input(s): "TROPONINIHS" in the last 720 hours.   Chemistry Recent Labs  Lab 01/31/24 0950  NA 137  K 3.7  CL 100  CO2 22  GLUCOSE 51*  BUN 45*  CREATININE 2.89*  CALCIUM 8.5*  GFRNONAA 20*  ANIONGAP 15    Recent Labs  Lab 01/31/24 0950  PROT 6.7  ALBUMIN 3.1*  AST 50*  ALT 21  ALKPHOS 69  BILITOT 1.8*   Lipids No results for input(s): "CHOL", "TRIG", "HDL", "LABVLDL", "LDLCALC", "CHOLHDL" in the last 168 hours.  Hematology Recent Labs  Lab 01/31/24 0950  WBC 5.6  RBC 3.26*  HGB 10.2*  HCT 33.0*  MCV 101.2*  MCH 31.3  MCHC 30.9  RDW 16.5*  PLT 140*   Thyroid No results for input(s): "TSH", "FREET4" in the last 168 hours.  BNP Recent Labs  Lab 01/31/24 0950  BNP 995.3*    DDimer No results for input(s): "DDIMER" in the last 168 hours.   Radiology/Studies:  [facility]  1 View Result Date: 01/31/2024 CLINICAL DATA:  Hypoglycemia. EXAM: PORTABLE CHEST 1  VIEW COMPARISON:  08/18/2023 FINDINGS: Decreased inspiration with grossly stable enlargement of the cardiac silhouette. Post CABG changes and aortic calcifications are again demonstrated. No significant change in mild-to-moderate diffuse peribronchial thickening and accentuation of the interstitial markings without airspace consolidation or pleural fluid. Gas distended gastric fundus. Right neck surgical clips. Moderate left glenohumeral joint degenerative changes. IMPRESSION: 1. No acute abnormality. 2. No significant change in mild-to-moderate chronic bronchitic changes. 3. Stable cardiomegaly. Electronically Signed   By: Beckie Salts M.D.   On: 01/31/2024 13:46     Assessment and Plan:   Acute on Chronic HFrEF Patient presented with hypoglycemia in setting of recent GI bug (nausea, vomiting, diarrhea) with poor PO intake but continued insulin. However, there was concern with volume overload. BNP 995. Chest x-ray shows cardiomegaly but no acute findings. Recent Echo in 11/2023 showed LVEF of 30-35% with global hypokinesis and moderate LVH, moderate RV dysfunction with moderately elevated PASP of 54.7 mmHg, stable MitraClip with mild residual mitral regurgitation (mean gradient ), and moderate to severe low flow low gradient aortic stenosis. He was given one dose of IV Lasix 40mg  in the ED. Renal function stable. - He has chronic lower extremity edema but states this is actually better than it has been. Otherwise, does not look significantly volume overloaded. - Continue home Torsemide 40mg  in the morning and 20mg  in the evening.  - GDMT limited due to renal function. - Continue home Hydralazine 25mg  daily and Imdur 90mg  daily. - Currently on Lopressor 50mg  twice daily. Will switch to Toprol-XL 100mg  daily tomorrow given reduced EF. - Will hold home Jardiance in the setting of hypoglycemia. - No ACEi/ ARB/ ARNI or MRA due to renal function. - Continue to monitor daily weight, strict I/Os, and  renal function.  - Patient was planning to have a R/LHC done on 02/12/2024 with [name] for TAVR work-up. Will discuss with him about possibly going ahead and doing this during this admission given renal function is stable. This would also allow [place] to get a more accurate assessment of his volume status.  CAD s/p CABG Patient has a long history of CAD s/p CABG in 1996, redo CABG in 2006, and PCI with DES to SVG-OM graft in 2015. - No chest pain.  - Continue antianginals: Lopressor 50mg  twice daily (will switch to Toprol-XL tomorrow as above), Imdur 90mg  daily, and Ranexa 500mg  twice daily.  - Continue statin. - Patient is on Aspirin 81mg  daily as well as low dose Eliquis. Will discuss stopping Aspirin given need for full anticoagulation with MD.  Permanent Atrial Fibrillation Rate controlled.  - Currently on Lopressor 50mg  twice daily. Will switch to Toprol-XL 100mg  daily tomorrow given reduced EF. - Currently on Eliquis 2.5mg  twice daily (reduced dose due to age and renal function). Will hold this for now in case we decide to proceed with R/ LHC this admission.  Severe Low Flow Low Gradient Aortic Stenosis Recent Echo in 11/2023 showed moderate to severe low flow low gradient aortic stenosis (mean gradient 12.38mmHg, DVI 0.22, SVI 20, Vmax 2.42 m/s). He was seen by [name] and is currently undergoing TAVR work-up. Plan was for [facility] on 02/12/2024.  - Will discuss with [name] about going ahead and doing [facility] while here given renal function is stable.  Mitral Regurgitation s/p MVR S/p MitraClip in 2021. Last Echo in 11/2023 showed stable MitraClip with only mild residual MR.  Hypertension  BP reasonably well controlled.  - Continue medications for CHF as above.  Hyperlipidemia - Continue Crestor 10mg  daily.  Hypoglycemia Type 2 Diabetes Mellitus Patient has a history of type 2 diabetes mellitus. He presented with hypoglycemia in setting of recent GI bug and continue insulin use. CBG  as low as the 20s while in the ED. He was treated with 1 amp of Dextrose. - CBG now improved but being admitted overnight for observation. - Management per primary team.  CKD Stage IIIb Baseline creatinine around 2.7 to 3.1 over the last year. Stable at 2.89 on admission. - Continue to monitor closely.   Risk Assessment/Risk Scores:    New York Heart Association (NYHA) Functional Class NYHA Class III  CHA2DS2-VASc Score = 6  This indicates a 9.7% annual risk of stroke. The patient's score is based upon: CHF History: 1 HTN History: 1 Diabetes History: 1 Stroke History: 0 Vascular Disease History: 1 Age Score: 2 Gender Score: 0    For questions or updates, please contact [facility] Please consult [email or web address] for contact info under    Signed, Corrin Parker, PA-C  01/31/2024 4:29 PM  Patient seen, examined. Available data reviewed. Agree with findings, assessment, and plan as outlined by Marjie Skiff, PA-C.  The patient is independently interviewed and examined.  His son and daughter are present at the bedside.  On my exam today, he is alert, oriented, in no distress.  He is an elderly gentleman.  HEENT is normal, JVP is normal, carotid upstrokes are normal with bilateral bruits, lungs are clear to auscultation bilaterally, heart is irregularly irregular with grade 2/6 crescendo decrescendo murmur at the right upper sternal border, abdomen is soft and nontender with no masses, extremities have 1+ pretibial and ankle edema with chronic stasis dermatitis but no erythema.  Neurologic is grossly intact with 5/5 strength in the arms and legs bilaterally.  The patient's labs are pertinent for stable creatinine of 2.89 in this gentleman with known stage IV chronic kidney disease.  Hemoglobin is 10.2 and platelet count is 140,000.  Baseline hemoglobin is closer to 12 mg/dL.  Portable chest x-ray shows mild to moderate chronic bronchitis changes but no other significant  abnormality and stable cardiomegaly is noted.  BNP is elevated at 995.  The patient has a mild exacerbation of acute on chronic heart failure with reduced ejection fraction in the context of chronic kidney disease, mitral valve disease status post transcatheter edge-to-edge repair of the mitral valve in 2021, and severe low-flow low gradient aortic stenosis.  He presents to the hospital because of symptomatic and severe hypoglycemia and cardiology is asked to see him regarding his heart failure management.  He has appropriately received IV diuretic therapy and I recommend placing him back on his home torsemide dose.  Will hold further Eliquis as I have recently evaluated him for consideration of TAVR to treat severe low-flow low gradient aortic stenosis.  He was previously scheduled for outpatient cardiac catheterization late this month and this could be done while he is here to better assess his hemodynamics and get him ready for TAVR if he turns out to be a candidate.  It will also allow [place] to manage his kidney function around the procedure.  The patient is high risk because of his heart failure, chronic kidney disease, and valvular heart disease, therefore safest to do his cardiac catheterization while he is admitted.  He has received apixaban today, so would hold further apixaban and tentatively plan on cardiac  catheterization Friday, 3/21.  Plan discussed with the patient and his family who understand and agree to proceed.   Tonny Bollman, M.D. 01/31/2024 4:59 PM

## 2024-02-01 ENCOUNTER — Ambulatory Visit: Admitting: Orthopaedic Surgery

## 2024-02-01 DIAGNOSIS — I080 Rheumatic disorders of both mitral and aortic valves: Secondary | ICD-10-CM | POA: Diagnosis present

## 2024-02-01 DIAGNOSIS — I2510 Atherosclerotic heart disease of native coronary artery without angina pectoris: Secondary | ICD-10-CM | POA: Diagnosis present

## 2024-02-01 DIAGNOSIS — I482 Chronic atrial fibrillation, unspecified: Secondary | ICD-10-CM

## 2024-02-01 DIAGNOSIS — E162 Hypoglycemia, unspecified: Secondary | ICD-10-CM | POA: Diagnosis present

## 2024-02-01 DIAGNOSIS — I130 Hypertensive heart and chronic kidney disease with heart failure and stage 1 through stage 4 chronic kidney disease, or unspecified chronic kidney disease: Secondary | ICD-10-CM | POA: Diagnosis present

## 2024-02-01 DIAGNOSIS — I5023 Acute on chronic systolic (congestive) heart failure: Secondary | ICD-10-CM | POA: Diagnosis present

## 2024-02-01 DIAGNOSIS — Z8249 Family history of ischemic heart disease and other diseases of the circulatory system: Secondary | ICD-10-CM | POA: Diagnosis not present

## 2024-02-01 DIAGNOSIS — E1122 Type 2 diabetes mellitus with diabetic chronic kidney disease: Secondary | ICD-10-CM | POA: Diagnosis present

## 2024-02-01 DIAGNOSIS — E785 Hyperlipidemia, unspecified: Secondary | ICD-10-CM | POA: Diagnosis present

## 2024-02-01 DIAGNOSIS — T383X1A Poisoning by insulin and oral hypoglycemic [antidiabetic] drugs, accidental (unintentional), initial encounter: Secondary | ICD-10-CM | POA: Diagnosis present

## 2024-02-01 DIAGNOSIS — E663 Overweight: Secondary | ICD-10-CM | POA: Diagnosis present

## 2024-02-01 DIAGNOSIS — Z66 Do not resuscitate: Secondary | ICD-10-CM | POA: Diagnosis present

## 2024-02-01 DIAGNOSIS — H353230 Exudative age-related macular degeneration, bilateral, stage unspecified: Secondary | ICD-10-CM | POA: Diagnosis present

## 2024-02-01 DIAGNOSIS — Z794 Long term (current) use of insulin: Secondary | ICD-10-CM | POA: Diagnosis not present

## 2024-02-01 DIAGNOSIS — Z955 Presence of coronary angioplasty implant and graft: Secondary | ICD-10-CM | POA: Diagnosis not present

## 2024-02-01 DIAGNOSIS — I25810 Atherosclerosis of coronary artery bypass graft(s) without angina pectoris: Secondary | ICD-10-CM | POA: Diagnosis present

## 2024-02-01 DIAGNOSIS — I255 Ischemic cardiomyopathy: Secondary | ICD-10-CM | POA: Diagnosis present

## 2024-02-01 DIAGNOSIS — E11649 Type 2 diabetes mellitus with hypoglycemia without coma: Secondary | ICD-10-CM | POA: Diagnosis present

## 2024-02-01 DIAGNOSIS — G4730 Sleep apnea, unspecified: Secondary | ICD-10-CM | POA: Diagnosis not present

## 2024-02-01 DIAGNOSIS — E1142 Type 2 diabetes mellitus with diabetic polyneuropathy: Secondary | ICD-10-CM | POA: Diagnosis present

## 2024-02-01 DIAGNOSIS — Z7901 Long term (current) use of anticoagulants: Secondary | ICD-10-CM | POA: Diagnosis not present

## 2024-02-01 DIAGNOSIS — Z79899 Other long term (current) drug therapy: Secondary | ICD-10-CM | POA: Diagnosis not present

## 2024-02-01 DIAGNOSIS — I5030 Unspecified diastolic (congestive) heart failure: Secondary | ICD-10-CM | POA: Diagnosis not present

## 2024-02-01 DIAGNOSIS — Z833 Family history of diabetes mellitus: Secondary | ICD-10-CM | POA: Diagnosis not present

## 2024-02-01 DIAGNOSIS — I10 Essential (primary) hypertension: Secondary | ICD-10-CM | POA: Diagnosis not present

## 2024-02-01 DIAGNOSIS — E8809 Other disorders of plasma-protein metabolism, not elsewhere classified: Secondary | ICD-10-CM | POA: Diagnosis present

## 2024-02-01 DIAGNOSIS — I5022 Chronic systolic (congestive) heart failure: Secondary | ICD-10-CM | POA: Diagnosis not present

## 2024-02-01 DIAGNOSIS — I350 Nonrheumatic aortic (valve) stenosis: Secondary | ICD-10-CM | POA: Diagnosis not present

## 2024-02-01 DIAGNOSIS — E782 Mixed hyperlipidemia: Secondary | ICD-10-CM | POA: Diagnosis not present

## 2024-02-01 DIAGNOSIS — D649 Anemia, unspecified: Secondary | ICD-10-CM | POA: Diagnosis present

## 2024-02-01 DIAGNOSIS — Z9889 Other specified postprocedural states: Secondary | ICD-10-CM | POA: Diagnosis not present

## 2024-02-01 DIAGNOSIS — I4821 Permanent atrial fibrillation: Secondary | ICD-10-CM | POA: Diagnosis present

## 2024-02-01 DIAGNOSIS — N184 Chronic kidney disease, stage 4 (severe): Secondary | ICD-10-CM | POA: Diagnosis present

## 2024-02-01 DIAGNOSIS — Z7984 Long term (current) use of oral hypoglycemic drugs: Secondary | ICD-10-CM | POA: Diagnosis not present

## 2024-02-01 LAB — CBC
HCT: 31.3 % — ABNORMAL LOW (ref 39.0–52.0)
Hemoglobin: 9.7 g/dL — ABNORMAL LOW (ref 13.0–17.0)
MCH: 30.2 pg (ref 26.0–34.0)
MCHC: 31 g/dL (ref 30.0–36.0)
MCV: 97.5 fL (ref 80.0–100.0)
Platelets: 129 10*3/uL — ABNORMAL LOW (ref 150–400)
RBC: 3.21 MIL/uL — ABNORMAL LOW (ref 4.22–5.81)
RDW: 16.5 % — ABNORMAL HIGH (ref 11.5–15.5)
WBC: 5.3 10*3/uL (ref 4.0–10.5)
nRBC: 0 % (ref 0.0–0.2)

## 2024-02-01 LAB — BASIC METABOLIC PANEL
Anion gap: 9 (ref 5–15)
BUN: 38 mg/dL — ABNORMAL HIGH (ref 8–23)
CO2: 26 mmol/L (ref 22–32)
Calcium: 8.4 mg/dL — ABNORMAL LOW (ref 8.9–10.3)
Chloride: 102 mmol/L (ref 98–111)
Creatinine, Ser: 2.77 mg/dL — ABNORMAL HIGH (ref 0.61–1.24)
GFR, Estimated: 21 mL/min — ABNORMAL LOW (ref >60)
Glucose, Bld: 98 mg/dL (ref 70–99)
Potassium: 3.9 mmol/L (ref 3.5–5.1)
Sodium: 137 mmol/L (ref 135–145)

## 2024-02-01 LAB — APTT: aPTT: 153 s — ABNORMAL HIGH (ref 24–36)

## 2024-02-01 LAB — GLUCOSE, CAPILLARY
Glucose-Capillary: 101 mg/dL — ABNORMAL HIGH (ref 70–99)
Glucose-Capillary: 104 mg/dL — ABNORMAL HIGH (ref 70–99)
Glucose-Capillary: 75 mg/dL (ref 70–99)
Glucose-Capillary: 69 mg/dL — ABNORMAL LOW (ref 70–99)
Glucose-Capillary: 73 mg/dL (ref 70–99)
Glucose-Capillary: 83 mg/dL (ref 70–99)
Glucose-Capillary: 142 mg/dL — ABNORMAL HIGH (ref 70–99)
Glucose-Capillary: 128 mg/dL — ABNORMAL HIGH (ref 70–99)
Glucose-Capillary: 156 mg/dL — ABNORMAL HIGH (ref 70–99)
Glucose-Capillary: 139 mg/dL — ABNORMAL HIGH (ref 70–99)
Glucose-Capillary: 136 mg/dL — ABNORMAL HIGH (ref 70–99)

## 2024-02-01 LAB — HEPARIN LEVEL (UNFRACTIONATED): Heparin Unfractionated: >1.1 [IU]/mL — ABNORMAL HIGH (ref 0.30–0.70)

## 2024-02-01 MED ORDER — TORSEMIDE 20 MG PO TABS
20.0000 mg | ORAL_TABLET | Freq: Every day | ORAL | Status: DC
  Administered 2024-02-02: 20 mg via ORAL
  Filled 2024-02-01: qty 1

## 2024-02-01 MED ORDER — HEPARIN (PORCINE) 25000 UT/250ML-% IV SOLN: 800.0000 [IU]/h | INTRAVENOUS | Status: DC

## 2024-02-01 MED ORDER — FUROSEMIDE 10 MG/ML IJ SOLN
40.0000 mg | Freq: Once | INTRAMUSCULAR | Status: AC
  Administered 2024-02-01: 40 mg via INTRAVENOUS
  Filled 2024-02-01: qty 4

## 2024-02-01 MED ORDER — HEPARIN (PORCINE) 25000 UT/250ML-% IV SOLN
1000.0000 [IU]/h | INTRAVENOUS | Status: DC
  Administered 2024-02-01: 1000 [IU]/h via INTRAVENOUS
  Filled 2024-02-01: qty 250

## 2024-02-01 MED ORDER — SODIUM CHLORIDE 0.9 % IV SOLN: INTRAVENOUS | Status: DC

## 2024-02-01 MED ORDER — OXYCODONE HCL 5 MG PO TABS
5.0000 mg | ORAL_TABLET | Freq: Once | ORAL | Status: AC
  Administered 2024-02-01: 5 mg via ORAL
  Filled 2024-02-01: qty 1

## 2024-02-01 MED ORDER — TORSEMIDE 20 MG PO TABS
40.0000 mg | ORAL_TABLET | Freq: Every day | ORAL | Status: DC
  Administered 2024-02-02 – 2024-02-03 (×2): 40 mg via ORAL
  Filled 2024-02-01 (×2): qty 2

## 2024-02-01 MED ORDER — ASPIRIN 81 MG PO CHEW
81.0000 mg | CHEWABLE_TABLET | ORAL | Status: AC
  Administered 2024-02-02: 81 mg via ORAL
  Filled 2024-02-01: qty 1

## 2024-02-01 NOTE — Progress Notes (Addendum)
 PHARMACY - ANTICOAGULATION CONSULT NOTE  Pharmacy Consult for heparin Indication: atrial fibrillation  Allergies  Allergen Reactions   Nsaids     CKD    Patient Measurements: Height: 5\' 7"  (170.2 cm) Weight: 72.5 kg (159 lb 13.3 oz) IBW/kg (Calculated) : 66.1 Heparin Dosing Weight: TBW  Vital Signs: Temp: 98.7 F (37.1 C) (03/20 1602) Temp Source: Oral (03/20 1602) BP: 128/51 (03/20 1602) Pulse Rate: 80 (03/20 1602)  Labs: Recent Labs    01/31/24 0950 02/01/24 0231 02/01/24 1758  HGB 10.2* 9.7*  --   HCT 33.0* 31.3*  --   PLT 140* 129*  --   HEPARINUNFRC  --   --  >1.10*  CREATININE 2.89* 2.77*  --     Estimated Creatinine Clearance: 16.9 mL/min (A) (by C-G formula based on SCr of 2.77 mg/dL (H)).   Medical History: Past Medical History:  Diagnosis Date   Age-related macular degeneration, wet, both eyes ([name])    Anemia    Secondary to acute blood loss   Anginal pain ([name])    last chest pain in Feb 2021   Arthritis    "mild in hands, knees, ankles" (11/13/2015)   Atrial fibrillation ([name])    Consideration was given for atrial flutter ablation, but patient developed atrial fibrillation. Cardioversion was done. Dr. Graciela Husbands decided to watch him clinically. November, 2011   Atrial flutter [facility]) 07/2010   September, 2011   Hospital with PNA and cath done.Marland KitchenMarland KitchenCoumadin.  Atrial flutter ablation planned, but  pt. then had atrial fibrillation,/outpatient conversion 09/08/10..NSR..plan to follow..Dr. Graciela Husbands   CAD (coronary artery disease)    Catheterization, September, 2011,  grafts patent from redo CABG,, medical therapy of coronary disease, consideration to proceeding with atrial flutter ablation   Carotid artery disease ([name])    Doppler 09/18/2009 - 49% bilateral stenoses   Carotid artery disease ([name])    49% bilateral, Doppler, November, 2010   CHF (congestive heart failure) ([name])    Chronic kidney disease (CKD), stage III (moderate) ([name])    Diabetic peripheral  neuropathy ([name])    feet   Diverticulosis of colon with hemorrhage 2009   several unit diverticular bleed    Erectile dysfunction    Mild   Gout    Hearing loss    wears hearing aids   History of blood transfusion "several"   related to diverticular bleeding   Hyperlipidemia    Hypertension    Mitral regurgitation    Mild, echo, September, 2011   NSTEMI (non-ST elevated myocardial infarction) ([name]) 07/2010   at [facility] with repeat cath, rec medical mgmt    OSA on CPAP    uses CPAP nightly   Personal history of colonic polyps    PNA (pneumonia) 07/2010   NSTEMI at [facility] with repeat cath, rec medical mgmt    RBBB (right bundle branch block)    S/P mitral valve repair 02/06/2020   s/p TEER with a single MitraClip NTW placed on A2/P2   Shoulder pain    "positional; better now" (11/13/2015)   Skin cancer    R lower leg, per derm 2012   Type II diabetes mellitus ([name])      Assessment: 89 YOM presenting with weakness and edema, hx of CAD, hx of afib on Eliquis PTA (last dose 3/19) held for possible cath and to transition to heparin for now.  PM - Heparin level > 1.10 as expected.  aPTT 153 sec above goal on 1000 units/hr.  Level drawn from L arm,  heparin infusion running through right PIV (confirmed at bedside).  No overt s/sx bleeding.   Goal of Therapy:  Heparin level 0.3-0.7 units/ml aPTT 66-102 seconds Monitor platelets by anticoagulation protocol: Yes   Plan:  HOLD heparin infusion x1 hour REDUCE heparin IV to 800 units/hr, start @2030  F/u 8 hour aPTT/HL F/u cath plans and ability to transition back to Eliquis  Trixie Rude, PharmD Clinical Pharmacist 02/01/2024  7:22 PM

## 2024-02-01 NOTE — Progress Notes (Signed)
 [facility] PROGRESS NOTE   [name] ZOX:[id number] DOB: 02/23/1932 DOA: 01/31/2024  PCP: Joaquim Nam, MD  Brief History:  89 y.o. male with medical history significant of macular degeneration, afib, CAD, stage 4 CKD, chronic systolic CHF, HTN, HLD, DM, and OSA on CPAP who presented on 3/19 with hypoglycemia.  Patient apparently took more insulin than he should have for unclear reasons.  There was also concern for congestive heart failure exacerbation from his aortic stenosis.  Patient was subsequently hospitalized for further management.     Consultants: Cardiology  Procedures: None yet.  Cardiac catheterization is planned for tomorrow as part of TAVR workup    Subjective/Interval History: Patient denies any chest pain or shortness of breath this morning.  No nausea vomiting.    Assessment/Plan:  Hypoglycemia Apparently took more insulin than he should have.  He also had recent GI illness as a result of which she had poor oral intake.  I think both these issues caused a significant drop in his glucose levels. Lantus was held. CBGs have stabilized.  Though a low glucose level was noted this morning which could be just due to the fact that he was NPO.  Continue to monitor CBGs.  Continue to hold insulin.  Check HbA1c.  Last HbA1c was 7.0 in December.  Chronic systolic CHF/severe aortic stenosis/coronary artery disease/mitral regurgitation Followed by cardiology.  Echocardiogram from January showed EF of 30 to 35%. Seen by cardiology during this admission.  Supposed to get a cardiac cath tomorrow as a part of his TAVR workup for aortic stenosis. Cardiac status otherwise noted to be stable. He is noted to be on aspirin, hydralazine, isosorbide mononitrate, metoprolol, Ranexa, rosuvastatin and torsemide. He is status post MitraClip in 2021.  History of atrial fibrillation Continue metoprolol.  Eliquis currently on hold for cardiac cath which is scheduled for  3/21.  Essential hypertension Monitor blood pressures closely.  Normocytic anemia Low hemoglobin noted but stable for the most part.  No evidence for overt bleeding.  Chronic kidney disease stage IV Baseline creatinine is between 2.7-3.0.  Creatinine close to baseline.  Avoid nephrotoxic agents. Apparently had been taking ibuprofen at home and he was told not to ever take this medicine or any other NSAIDs.  Obstructive sleep apnea CPAP   DVT Prophylaxis: Anticoagulation currently on hold Code Status: DNR Family Communication: Discussed with patient Disposition Plan: Hopefully return home when improved  Status is: Observation The patient will require care spanning > 2 midnights and should be moved to inpatient because: Hyperglycemia, cardiac workup      Medications: Scheduled:  allopurinol  50 mg Oral Q M,W,F   aspirin EC  81 mg Oral Daily   hydrALAZINE  25 mg Oral QHS   insulin aspart  0-5 Units Subcutaneous QHS   insulin aspart  0-6 Units Subcutaneous TID WC   isosorbide mononitrate  90 mg Oral Daily   magnesium oxide  400 mg Oral QHS   metoprolol succinate  100 mg Oral Daily   pantoprazole  40 mg Oral Daily   polyethylene glycol  17 g Oral Daily   ranolazine  500 mg Oral BID   rosuvastatin  10 mg Oral q AM   sodium chloride flush  3 mL Intravenous Q12H   [START ON 02/02/2024] torsemide  20 mg Oral Daily   [START ON 02/02/2024] torsemide  40 mg Oral Daily   Continuous:  heparin 1,000 Units/hr (02/01/24 0951)   WJX:[id number] **OR** acetaminophen, bisacodyl, hydrALAZINE, HYDROcodone-acetaminophen,  polyethylene glycol  Antibiotics: Anti-infectives (From admission, onward)    None       Objective:  Vital Signs  Vitals:   02/01/24 0000 02/01/24 0405 02/01/24 0800 02/01/24 0930  BP:  (!) 126/58 (!) 143/75 117/71  Pulse:  79 69 (!) 101  Resp:  14 18   Temp:  98.1 F (36.7 C) 98.3 F (36.8 C)   TempSrc:  Oral Oral   SpO2:  96% 92%   Weight: 72.5 kg      Height:        Intake/Output Summary (Last 24 hours) at 02/01/2024 1000 Last data filed at 02/01/2024 0943 Gross per 24 hour  Intake 678 ml  Output 1125 ml  Net -447 ml   Filed Weights   01/31/24 0934 02/01/24 0000  Weight: 70.1 kg 72.5 kg    General appearance: Awake alert.  In no distress Resp: Clear to auscultation bilaterally.  Normal effort Cardio: S1-S2 is normal regular.  No S3-S4.  No rubs murmurs or bruit GI: Abdomen is soft.  Nontender nondistended.  Bowel sounds are present normal.  No masses organomegaly Extremities: No edema.  Full range of motion of lower extremities. Neurologic: Alert and oriented x3.  No focal neurological deficits.    Lab Results:  Data Reviewed: I have personally reviewed following labs and reports of the imaging studies  CBC: Recent Labs  Lab 01/31/24 0950 02/01/24 0231  WBC 5.6 5.3  NEUTROABS 4.1  --   HGB 10.2* 9.7*  HCT 33.0* 31.3*  MCV 101.2* 97.5  PLT 140* 129*    Basic Metabolic Panel: Recent Labs  Lab 01/31/24 0950 02/01/24 0231  NA 137 137  K 3.7 3.9  CL 100 102  CO2 22 26  GLUCOSE 51* 98  BUN 45* 38*  CREATININE 2.89* 2.77*  CALCIUM 8.5* 8.4*    GFR: Estimated Creatinine Clearance: 16.9 mL/min (A) (by C-G formula based on SCr of 2.77 mg/dL (H)).  Liver Function Tests: Recent Labs  Lab 01/31/24 0950  AST 50*  ALT 21  ALKPHOS 69  BILITOT 1.8*  PROT 6.7  ALBUMIN 3.1*    CBG: Recent Labs  Lab 02/01/24 0202 02/01/24 0403 02/01/24 0609 02/01/24 0858 02/01/24 0935  GLUCAP 101* 104* 75 69* 73     Recent Results (from the past 240 hours)  MRSA Next Gen by PCR, Nasal     Status: None   Collection Time: 01/31/24  8:37 PM   Specimen: Nasal Mucosa; Nasal Swab  Result Value Ref Range Status   MRSA by PCR Next Gen NOT DETECTED NOT DETECTED Final    Comment: (NOTE) The GeneXpert MRSA Assay (FDA approved for NASAL specimens only), is one component of a comprehensive MRSA colonization  surveillance program. It is not intended to diagnose MRSA infection nor to guide or monitor treatment for MRSA infections. Test performance is not FDA approved in patients less than 29 years old. Performed at [facility], [address] [place], [place] [address]       Radiology Studies: DG Chest Port 1 View Result Date: 01/31/2024 CLINICAL DATA:  Hypoglycemia. EXAM: PORTABLE CHEST 1 VIEW COMPARISON:  08/18/2023 FINDINGS: Decreased inspiration with grossly stable enlargement of the cardiac silhouette. Post CABG changes and aortic calcifications are again demonstrated. No significant change in mild-to-moderate diffuse peribronchial thickening and accentuation of the interstitial markings without airspace consolidation or pleural fluid. Gas distended gastric fundus. Right neck surgical clips. Moderate left glenohumeral joint degenerative changes. IMPRESSION: 1. No acute  abnormality. 2. No significant change in mild-to-moderate chronic bronchitic changes. 3. Stable cardiomegaly. Electronically Signed   By: Beckie Salts M.D.   On: 01/31/2024 13:46       LOS: 0 days   Tremain Rucinski Rito Ehrlich  [facility] Pager on [email or web address]  02/01/2024, 10:00 AM

## 2024-02-01 NOTE — Evaluation (Signed)
 Occupational Therapy Evaluation Patient Details Name: [name] MRN: [id number] DOB: 1932-06-26 Today's Date: 02/01/2024   History of Present Illness   Pt is 89 year old presented to [facility] on  01/31/24 for hypoglycemia due to taking too much insulin. Pt for cardiac cath on 3/21 as part of TAVR work up. PMH - macular degeneration, afib, CAD, CKD, CHF, HTN, DM, OSA, aortic stenosis, CABG, rt rotator cuff injury     Clinical Impressions Pt lives alone and ambulate with a RW. He stands to shower always holding the grab bar. Pt reports increased difficulty with LB bathing and dressing recently and inability to don compression socks due to weakness in his hands and LE swelling. He is familiar with compression sock donner, but reports it has not been helpful in the past. Pt presents with generalized weakness, impaired standing balance and poor vision due to macular degeneration. He is able to read with difficulty and use of a magnifying glass. He stands and ambulates with RW and supervision and needs up to min assist for ADLs. Recommending HHOT upon discharge.      If plan is discharge home, recommend the following:   A little help with walking and/or transfers;A lot of help with bathing/dressing/bathroom;Assist for transportation;Help with stairs or ramp for entrance     Functional Status Assessment   Patient has had a recent decline in their functional status and demonstrates the ability to make significant improvements in function in a reasonable and predictable amount of time.     Equipment Recommendations   None recommended by OT (defer bathroom DME to [facility])     Recommendations for Other Services         Precautions/Restrictions   Precautions Precautions: Fall Recall of Precautions/Restrictions: Intact Restrictions Weight Bearing Restrictions Per Provider Order: No     Mobility Bed Mobility               General bed mobility comments: in chair     Transfers Overall transfer level: Needs assistance Equipment used: Rolling walker (2 wheels) Transfers: Sit to/from Stand Sit to Stand: Supervision           General transfer comment: groans with effort of sit to stand from recliner      Balance Overall balance assessment: Needs assistance   Sitting balance-Leahy Scale: Good     Standing balance support: Single extremity supported Standing balance-Leahy Scale: Poor Standing balance comment: requires stabilization on grab bar while urinating, RW with ambulation                           ADL either performed or assessed with clinical judgement   ADL Overall ADL's : Needs assistance/impaired Eating/Feeding: Independent;Sitting   Grooming: Supervision/safety;Standing   Upper Body Bathing: Set up;Sitting   Lower Body Bathing: Minimal assistance;Sit to/from stand Lower Body Bathing Details (indicate cue type and reason): recommended long handled bath sponge Upper Body Dressing : Set up;Sitting   Lower Body Dressing: Minimal assistance;Sit to/from stand Lower Body Dressing Details (indicate cue type and reason): pt has been unable to don his compression socks, needs help to start socks over toes and for shoes Toilet Transfer: Supervision/safety Toilet Transfer Details (indicate cue type and reason): stood to urinate holding grab bar Toileting- Clothing Manipulation and Hygiene: Minimal assistance Toileting - Clothing Manipulation Details (indicate cue type and reason): assist to manage clothing     Functional mobility during ADLs: Supervision/safety;Rolling walker (2 wheels)  Vision Baseline Vision/History: 6 Macular Degeneration Ability to See in Adequate Light: 2 Moderately impaired Patient Visual Report: No change from baseline Additional Comments: uses a magnifying glass to read     Perception         Praxis         Pertinent Vitals/Pain Pain Assessment Pain Assessment: Faces Faces  Pain Scale: Hurts a little bit Pain Location: R shoulder with FF >90 degrees Pain Descriptors / Indicators: Discomfort Pain Intervention(s): Monitored during session, Repositioned     Extremity/Trunk Assessment Upper Extremity Assessment Upper Extremity Assessment: Right hand dominant;RUE deficits/detail;Generalized weakness RUE Deficits / Details: reports shoulder pain >90 degrees FF, long standing   Lower Extremity Assessment Lower Extremity Assessment: Defer to PT evaluation   Cervical / Trunk Assessment Cervical / Trunk Assessment: Kyphotic   Communication Communication Communication: Impaired Factors Affecting Communication: Hearing impaired   Cognition Arousal: Alert Behavior During Therapy: WFL for tasks assessed/performed Cognition: No apparent impairments                               Following commands: Intact       Cueing  General Comments      VSS on RA   Exercises     Shoulder Instructions      Home Living Family/patient expects to be discharged to:: Private residence Living Arrangements: Alone Available Help at Discharge: Family;Available PRN/intermittently Type of Home: House Home Access: Stairs to enter [employer] of Steps: 3 Entrance Stairs-Rails: Right Home Layout: One level;Other (Comment) (sunken living room with rail)     Bathroom Shower/Tub: Producer, television/film/video: Standard     Home Equipment: Agricultural consultant (2 wheels);Crutches;Toilet riser;Lift chair   Additional Comments: Has ordered toilet riser which should arrive today. Has lift chair but hasn't been using the lift feature.      Prior Functioning/Environment Prior Level of Function : Independent/Modified Independent             Mobility Comments: Uses rolling walker ADLs Comments: Son drives him to grocery story where he rides in motorized cart, stands to shower, unable to don compression socks    OT Problem List: Decreased  strength;Decreased activity tolerance;Impaired balance (sitting and/or standing);Decreased knowledge of precautions;Impaired UE functional use;Cardiopulmonary status limiting activity;Increased edema   OT Treatment/Interventions: Self-care/ADL training;DME and/or AE instruction;Therapeutic activities;Patient/family education;Balance training      OT Goals(Current goals can be found in the care plan section)   Acute Rehab OT Goals OT Goal Formulation: With patient Potential to Achieve Goals: Good ADL Goals Pt Will Perform Grooming: with modified independence;standing Pt Will Perform Lower Body Bathing: sit to/from stand;with supervision Pt Will Perform Lower Body Dressing: with supervision;sit to/from stand Pt Will Transfer to Toilet: with modified independence;ambulating;bedside commode (over toilet) Pt Will Perform Toileting - Clothing Manipulation and hygiene: with modified independence;sit to/from stand Additional ADL Goal #1: Pt will state at least 3 energy conservation strategies as instructed.   OT Frequency:  Min 2X/week    Co-evaluation              AM-PAC OT "6 Clicks" Daily Activity     Outcome Measure Help from another person eating meals?: None Help from another person taking care of personal grooming?: A Little Help from another person toileting, which includes using toliet, bedpan, or urinal?: A Little Help from another person bathing (including washing, rinsing, drying)?: A Lot Help from another person to put on  and taking off regular upper body clothing?: A Little Help from another person to put on and taking off regular lower body clothing?: A Lot 6 Click Score: 17   End of Session Equipment Utilized During Treatment: Gait belt;Rolling walker (2 wheels)  Activity Tolerance: Patient tolerated treatment well Patient left: in chair;with call bell/phone within reach;with chair alarm set  OT Visit Diagnosis: Unsteadiness on feet (R26.81);Muscle weakness  (generalized) (M62.81);Other (comment) (decreased activity tolerance)                Time: 8657-8469 OT Time Calculation (min): 33 min Charges:  OT General Charges $OT Visit: 1 Visit OT Evaluation $OT Eval Moderate Complexity: 1 Mod OT Treatments $Self Care/Home Management : 8-22 mins  Berna Spare, OTR/L Acute Rehabilitation Services Office: [phone]   Evern Bio 02/01/2024, 1:58 PM

## 2024-02-01 NOTE — Progress Notes (Signed)
 PHARMACY - ANTICOAGULATION CONSULT NOTE  Pharmacy Consult for heparin Indication: atrial fibrillation  Allergies  Allergen Reactions   Nsaids     CKD    Patient Measurements: Height: 5\' 7"  (170.2 cm) Weight: 72.5 kg (159 lb 13.3 oz) IBW/kg (Calculated) : 66.1 Heparin Dosing Weight: TBW  Vital Signs: Temp: 98.3 F (36.8 C) (03/20 0800) Temp Source: Oral (03/20 0800) BP: 143/75 (03/20 0800) Pulse Rate: 69 (03/20 0800)  Labs: Recent Labs    01/31/24 0950 02/01/24 0231  HGB 10.2* 9.7*  HCT 33.0* 31.3*  PLT 140* 129*  CREATININE 2.89* 2.77*    Estimated Creatinine Clearance: 16.9 mL/min (A) (by C-G formula based on SCr of 2.77 mg/dL (H)).   Medical History: Past Medical History:  Diagnosis Date   Age-related macular degeneration, wet, both eyes ([name])    Anemia    Secondary to acute blood loss   Anginal pain ([name])    last chest pain in Feb 2021   Arthritis    "mild in hands, knees, ankles" (11/13/2015)   Atrial fibrillation ([name])    Consideration was given for atrial flutter ablation, but patient developed atrial fibrillation. Cardioversion was done. Dr. Graciela Husbands decided to watch him clinically. November, 2011   Atrial flutter [facility]) 07/2010   September, 2011   Hospital with PNA and cath done.Marland KitchenMarland KitchenCoumadin.  Atrial flutter ablation planned, but  pt. then had atrial fibrillation,/outpatient conversion 09/08/10..NSR..plan to follow..Dr. Graciela Husbands   CAD (coronary artery disease)    Catheterization, September, 2011,  grafts patent from redo CABG,, medical therapy of coronary disease, consideration to proceeding with atrial flutter ablation   Carotid artery disease ([name])    Doppler 09/18/2009 - 49% bilateral stenoses   Carotid artery disease ([name])    49% bilateral, Doppler, November, 2010   CHF (congestive heart failure) ([name])    Chronic kidney disease (CKD), stage III (moderate) ([name])    Diabetic peripheral neuropathy ([name])    feet   Diverticulosis of colon with hemorrhage  2009   several unit diverticular bleed    Erectile dysfunction    Mild   Gout    Hearing loss    wears hearing aids   History of blood transfusion "several"   related to diverticular bleeding   Hyperlipidemia    Hypertension    Mitral regurgitation    Mild, echo, September, 2011   NSTEMI (non-ST elevated myocardial infarction) ([name]) 07/2010   at [facility] with repeat cath, rec medical mgmt    OSA on CPAP    uses CPAP nightly   Personal history of colonic polyps    PNA (pneumonia) 07/2010   NSTEMI at [facility] with repeat cath, rec medical mgmt    RBBB (right bundle branch block)    S/P mitral valve repair 02/06/2020   s/p TEER with a single MitraClip NTW placed on A2/P2   Shoulder pain    "positional; better now" (11/13/2015)   Skin cancer    R lower leg, per derm 2012   Type II diabetes mellitus ([name])      Assessment: 89 YOM presenting with weakness and edema, hx of CAD, hx of afib on Eliquis PTA (last dose 3/19) held for possible cath and to transition to heparin for now  Goal of Therapy:  Heparin level 0.3-0.7 units/ml aPTT 66-102 seconds Monitor platelets by anticoagulation protocol: Yes   Plan:  Heparin gtt at 1000 units/hr, no bolus F/u 8 hour aPTT/HL F/u cath plans and ability to transition back to Eliquis  Daylene Posey, PharmD,  [facility] Clinical Pharmacist ED Pharmacist Phone # [phone] 02/01/2024 8:50 AM

## 2024-02-01 NOTE — Progress Notes (Addendum)
 Patient Name: [name] Date of Encounter: 02/01/2024 [facility] Cardiologist: Nanetta Batty, MD   Interval Summary  .    Hard of hearing but feeling good without any shortness of breath.  He is on IV fluids right now, 10. No complaints.   Vital Signs .    Vitals:   01/31/24 2000 01/31/24 2308 02/01/24 0000 02/01/24 0405  BP: (!) 144/73 (!) 148/61  (!) 126/58  Pulse: 71 69  79  Resp: (!) 23 15  14   Temp:  97.8 F (36.6 C)  98.1 F (36.7 C)  TempSrc:  Oral  Oral  SpO2: 98% 98%  96%  Weight:   72.5 kg   Height:        Intake/Output Summary (Last 24 hours) at 02/01/2024 0745 Last data filed at 02/01/2024 0000 Gross per 24 hour  Intake 360 ml  Output 800 ml  Net -440 ml      02/01/2024   12:00 AM 01/31/2024    9:34 AM 01/23/2024   11:42 AM  Last 3 Weights  Weight (lbs) 159 lb 13.3 oz 154 lb 9.6 oz --  Weight (kg) 72.5 kg 70.126 kg --      Telemetry/ECG    Sinus 60-70- Personally Reviewed  CV Studies    Echocardiogram 12/13/2023  1. Left ventricular ejection fraction, by estimation, is 30 to 35%. The  left ventricle has moderately decreased function. The left ventricle  demonstrates global hypokinesis. There is moderate concentric left  ventricular hypertrophy. Left ventricular  diastolic function could not be evaluated.   2. Right ventricular systolic function is moderately reduced. The right  ventricular size is normal. There is moderately elevated pulmonary artery  systolic pressure. The estimated right ventricular systolic pressure is  54.7 mmHg.   3. The mitral valve has been repaired/replaced. There is a single  MitraClip NTW placed on A2/P2 Mitra-Clip present in the mitral position.  Procedure Date: 02/06/2020. The mitra Clip is well seated. The mean MVG is  consistent with mild mitral  stenosis. Mild mitral regurgitation is present.   4. The aortic valve is calcified. There is severe calcifcation of the  aortic valve. There is  severe thickening of the aortic valve. Aortic valve  regurgitation is mild. Aortic valve mean gradient measures 12.0 mmHg.  Aortic valve Vmax measures 2.42 m/s.  low DVI 0.22 and SVI 20. Despite a low AVG and Vmax, all findings suggest  at least moderate and likely moderate to severe low flow low gradient  aortic stenosis in setting of HFrEF.   5. The inferior vena cava is dilated in size with <50% respiratory  variability, suggesting right atrial pressure of 15 mmHg.   6. Ascending aorta measurements are within normal limits for age when  indexed to body surface area.   7. Evidence of residual atrial level shunting detected by color flow  Doppler from prior transseptal puncture site .   8. Compared to prior study dated 11/24/22, the mean AVG has increased from  to , Vmax has icnreased from 1.31m/s to 2.54m/s, DVI has  decreased from 0.34 to 0.22. Now at least moderate and likely moderate to  severe low flow low gradient Aortic  stenosis. Stable Mitra-Clip with minimal increase in mean MVG from 4 to  .    Physical Exam .   GEN: No acute distress.   Neck: + JVD Cardiac: RRR, no murmurs, rubs, or gallops.  Respiratory: Slight crackles GI: Soft, nontender, non-distended  MS:  2+ pitting edema, L>R  Patient Profile    [name] is a 89 y.o. male has hx of  CAD s/p multiple interventions (CABG in 1996, redo CABG in 2006, and PCI with DES to SVG-OM graft in 05/2014), Plavix non-responder, chronic HFrEF with EF of 30-35% in 11/2023, permanent atrial fibrillation on Eliquis, mitral regurgitation s/p MitraClip in 01/2020 with mild residual regurgitation, severe low flow low gradient aortic stenosis (currently being worked up for TAVR), bilateral carotid stenosis s/p right CEA in 1994, hypertension, hyperlipidemia, CKD stage IIIb, obstructive sleep apnea, and GI bleeding s/p left sided hemicolectomy and sigmoidectomy.  Cardiology initially saw for concerns of CHF exacerbation,  admitted for persistent hypoglycemia.  Assessment & Plan .     Chronic HFrEF  11/2023 EF 30 to 35% with moderately reduced RV function.  RVSP 54.7.  For some reason was given IV fluids today.  Volume status looks worse today,   although asymptomatic. Stop IV fluids. Will give one dose of IV lasix 40mg  now and hold night torsemide.  PTA was on torsemide 40 mg in the morning and 20 mg at night. GDMT limited by renal function: On hydralazine 25 mg daily, Imdur 90 mg, Toprol-XL 100 mg.  Holding Jardiance in setting of hypoglycemia.  Severe low-flow low gradient Mean gradient 12, DVI 0.22, SVI, 0.42.  Since he is here and already had tentative plans for cardiac cath for TAVR workup, will move up and will plan for this Friday 03/21.  Informed Consent   Shared Decision Making/Informed Consent The risks [stroke (1 in 1000), death (1 in 1000), kidney failure [usually temporary] (1 in 500), bleeding (1 in 200), allergic reaction [possibly serious] (1 in 200)], benefits (diagnostic support and management of coronary artery disease) and alternatives of a cardiac catheterization were discussed in detail with Mr. Fernando and he is willing to proceed.     MVR status post mitral clip 2021 Mild residual MR.  Stable.  Continue to follow  CAD status post CABG  Extensive hx CABG in 1996, redo CABG in 2006, and PCI with DES to SVG-OM graft in 2015.  Stable disease, no anginal complaints Continue: crestor 10mg  (LDL 67), Toprol XL, Imdur 90mg  daily, and Ranexa 500mg  twice daily.  Already on aspirin and Eliquis, defer to MD if he wants him on both  LDL   Permanent atrial fibrillation Rate controlled heart rates in the 60s and 70s. Continue with Toprol-XL 100 mg daily.  PTA on Eliquis 2.5 mg. Holding in anticipation for cardiac catheterization, resume per interventionalists. Will start IV heparin.   Hypoglycemia  Type 2 diabetes In setting of recent GI blood and not associated.  Management per primary  team.   For questions or updates, please contact [facility] Please consult [email or web address] for contact info under        Signed, Abagail Kitchens, PA-C

## 2024-02-01 NOTE — Evaluation (Signed)
 Physical Therapy Evaluation Patient Details Name: [name] MRN: [id number] DOB: 1932-08-14 Today's Date: 02/01/2024  History of Present Illness  Pt is 89 year old presented to [facility] on  01/31/24 for hypoglycemia due to taking too much insulin. Pt for cardiac cath on 3/21 as part of TAVR work up. PMH - macular degeneration, afib, CAD, CKD, CHF, HTN, DM, OSA, aortic stenosis, CABG, rt rotator cuff injury  Clinical Impression  Pt admitted with above diagnosis and presents to PT with functional limitations due to deficits listed below (See PT problem list). Pt needs skilled PT to maximize independence and safety. Pt with slight decr in mobility from recent baseline. Expect he will make steady progress back to baseline and recommend return home with HHPT.           If plan is discharge home, recommend the following: Assistance with cooking/housework;Assist for transportation;Help with stairs or ramp for entrance   Can travel by private vehicle        Equipment Recommendations None recommended by PT  Recommendations for Other Services       Functional Status Assessment Patient has had a recent decline in their functional status and demonstrates the ability to make significant improvements in function in a reasonable and predictable amount of time.     Precautions / Restrictions Precautions Precautions: Fall Recall of Precautions/Restrictions: Intact Restrictions Weight Bearing Restrictions Per Provider Order: No      Mobility  Bed Mobility Overal bed mobility: Modified Independent                  Transfers Overall transfer level: Needs assistance Equipment used: Rolling walker (2 wheels) Transfers: Sit to/from Stand Sit to Stand: Contact guard assist, From elevated surface           General transfer comment: Incr time. From elevated surface.    Ambulation/Gait Ambulation/Gait assistance: Contact guard assist Gait Distance (Feet): 125 Feet Assistive device:  Rolling walker (2 wheels) Gait Pattern/deviations: Step-through pattern, Decreased stride length Gait velocity: decr Gait velocity interpretation: 1.31 - 2.62 ft/sec, indicative of limited community ambulator   General Gait Details: Assist for safety and lines  Stairs            Wheelchair Mobility     Tilt Bed    Modified Rankin (Stroke Patients Only)       Balance Overall balance assessment: Needs assistance Sitting-balance support: No upper extremity supported, Feet supported Sitting balance-Leahy Scale: Normal     Standing balance support: No upper extremity supported, During functional activity Standing balance-Leahy Scale: Fair                               Pertinent Vitals/Pain Pain Assessment Pain Assessment: No/denies pain    Home Living Family/patient expects to be discharged to:: Private residence Living Arrangements: Alone Available Help at Discharge: Family;Available PRN/intermittently Type of Home: House Home Access: Stairs to enter Entrance Stairs-Rails: Right Entrance Stairs-Number of Steps: 3   Home Layout: One level (with single steps up/down due to sunken living room) Home Equipment: Agricultural consultant (2 wheels);Crutches;Toilet riser;Lift chair Additional Comments: Has ordered toilet riser which should arrive today. Has lift chair but hasn't been using the lift feature.    Prior Function Prior Level of Function : Independent/Modified Independent             Mobility Comments: Uses rolling walker ADLs Comments: Son drives him to grocery story where he rides  in motorized cart     Extremity/Trunk Assessment   Upper Extremity Assessment Upper Extremity Assessment: Defer to OT evaluation    Lower Extremity Assessment Lower Extremity Assessment: Generalized weakness       Communication   Communication Communication: Impaired Factors Affecting Communication: Hearing impaired    Cognition Arousal: Alert Behavior  During Therapy: WFL for tasks assessed/performed   PT - Cognitive impairments: No apparent impairments                         Following commands: Intact       Cueing       General Comments General comments (skin integrity, edema, etc.): VSS on RA    Exercises     Assessment/Plan    PT Assessment Patient needs continued PT services  PT Problem List Decreased strength;Decreased activity tolerance;Decreased balance;Decreased mobility       PT Treatment Interventions DME instruction;Gait training;Stair training;Functional mobility training;Therapeutic activities;Therapeutic exercise;Balance training;Patient/family education    PT Goals (Current goals can be found in the Care Plan section)  Acute Rehab PT Goals Patient Stated Goal: return home PT Goal Formulation: With patient Time For Goal Achievement: 02/15/24 Potential to Achieve Goals: Good    Frequency Min 2X/week     Co-evaluation               AM-PAC PT "6 Clicks" Mobility  Outcome Measure Help needed turning from your back to your side while in a flat bed without using bedrails?: None Help needed moving from lying on your back to sitting on the side of a flat bed without using bedrails?: None Help needed moving to and from a bed to a chair (including a wheelchair)?: A Little Help needed standing up from a chair using your arms (e.g., wheelchair or bedside chair)?: A Little Help needed to walk in hospital room?: A Little Help needed climbing 3-5 steps with a railing? : A Little 6 Click Score: 20    End of Session Equipment Utilized During Treatment: Gait belt Activity Tolerance: Patient limited by fatigue Patient left: in chair;with call bell/phone within reach;with chair alarm set Nurse Communication: Mobility status;Other (comment) (chair alarm on but not hooked in to call system) PT Visit Diagnosis: Other abnormalities of gait and mobility (R26.89);Muscle weakness (generalized) (M62.81)     Time: 1027-2536 PT Time Calculation (min) (ACUTE ONLY): 27 min   Charges:   PT Evaluation $PT Eval Moderate Complexity: 1 Mod PT Treatments $Gait Training: 8-22 mins PT General Charges $$ ACUTE PT VISIT: 1 Visit         [facility] PT Acute Rehabilitation Services Office [phone]   Angelina Ok [facility] 02/01/2024, 11:34 AM

## 2024-02-01 NOTE — Plan of Care (Signed)
  Problem: Education: Goal: Ability to describe self-care measures that may prevent or decrease complications (Diabetes Survival Skills Education) will improve Outcome: Progressing Goal: Individualized Educational Video(s) Outcome: Progressing   Problem: Coping: Goal: Ability to adjust to condition or change in health will improve Outcome: Progressing   Problem: Fluid Volume: Goal: Ability to maintain a balanced intake and output will improve Outcome: Progressing   Problem: Health Behavior/Discharge Planning: Goal: Ability to identify and utilize available resources and services will improve Outcome: Progressing Goal: Ability to manage health-related needs will improve Outcome: Progressing   Problem: Metabolic: Goal: Ability to maintain appropriate glucose levels will improve Outcome: Progressing   Problem: Nutritional: Goal: Maintenance of adequate nutrition will improve Outcome: Progressing Goal: Progress toward achieving an optimal weight will improve Outcome: Progressing   Problem: Skin Integrity: Goal: Risk for impaired skin integrity will decrease Outcome: Progressing   Problem: Tissue Perfusion: Goal: Adequacy of tissue perfusion will improve Outcome: Progressing   Problem: Education: Goal: Knowledge of General Education information will improve Description: Including pain rating scale, medication(s)/side effects and non-pharmacologic comfort measures Outcome: Progressing   Problem: Health Behavior/Discharge Planning: Goal: Ability to manage health-related needs will improve Outcome: Progressing   Problem: Clinical Measurements: Goal: Ability to maintain clinical measurements within normal limits will improve Outcome: Progressing Goal: Will remain free from infection Outcome: Progressing Goal: Diagnostic test results will improve Outcome: Progressing Goal: Respiratory complications will improve Outcome: Progressing Goal: Cardiovascular complication will  be avoided Outcome: Progressing   Problem: Activity: Goal: Risk for activity intolerance will decrease Outcome: Progressing   Problem: Nutrition: Goal: Adequate nutrition will be maintained Outcome: Progressing   Problem: Pain Managment: Goal: General experience of comfort will improve and/or be controlled Outcome: Progressing   Problem: Safety: Goal: Ability to remain free from injury will improve Outcome: Progressing   Problem: Skin Integrity: Goal: Risk for impaired skin integrity will decrease Outcome: Progressing   Problem: Education: Goal: Understanding of CV disease, CV risk reduction, and recovery process will improve Outcome: Progressing Goal: Individualized Educational Video(s) Outcome: Progressing   Problem: Activity: Goal: Ability to return to baseline activity level will improve Outcome: Progressing   Problem: Cardiovascular: Goal: Ability to achieve and maintain adequate cardiovascular perfusion will improve Outcome: Progressing Goal: Vascular access site(s) Level 0-1 will be maintained Outcome: Progressing   Problem: Health Behavior/Discharge Planning: Goal: Ability to safely manage health-related needs after discharge will improve Outcome: Progressing   Problem: Education: Goal: Ability to demonstrate management of disease process will improve Outcome: Progressing Goal: Ability to verbalize understanding of medication therapies will improve Outcome: Progressing Goal: Individualized Educational Video(s) Outcome: Progressing   Problem: Activity: Goal: Capacity to carry out activities will improve Outcome: Progressing   Problem: Cardiac: Goal: Ability to achieve and maintain adequate cardiopulmonary perfusion will improve Outcome: Progressing

## 2024-02-02 ENCOUNTER — Encounter (HOSPITAL_COMMUNITY): Payer: Self-pay | Admitting: Cardiovascular Disease

## 2024-02-02 ENCOUNTER — Encounter (HOSPITAL_COMMUNITY)
Admission: EM | Disposition: A | Discharge status: Home Health | Payer: Self-pay | Source: Home / Self Care | Attending: Internal Medicine

## 2024-02-02 DIAGNOSIS — I10 Essential (primary) hypertension: Secondary | ICD-10-CM | POA: Diagnosis not present

## 2024-02-02 DIAGNOSIS — Z9889 Other specified postprocedural states: Secondary | ICD-10-CM

## 2024-02-02 DIAGNOSIS — E11649 Type 2 diabetes mellitus with hypoglycemia without coma: Secondary | ICD-10-CM | POA: Diagnosis not present

## 2024-02-02 DIAGNOSIS — N184 Chronic kidney disease, stage 4 (severe): Secondary | ICD-10-CM

## 2024-02-02 DIAGNOSIS — I350 Nonrheumatic aortic (valve) stenosis: Secondary | ICD-10-CM | POA: Diagnosis not present

## 2024-02-02 DIAGNOSIS — E782 Mixed hyperlipidemia: Secondary | ICD-10-CM

## 2024-02-02 DIAGNOSIS — G4730 Sleep apnea, unspecified: Secondary | ICD-10-CM

## 2024-02-02 DIAGNOSIS — I2510 Atherosclerotic heart disease of native coronary artery without angina pectoris: Secondary | ICD-10-CM | POA: Diagnosis not present

## 2024-02-02 DIAGNOSIS — I5022 Chronic systolic (congestive) heart failure: Secondary | ICD-10-CM

## 2024-02-02 DIAGNOSIS — I4821 Permanent atrial fibrillation: Secondary | ICD-10-CM | POA: Diagnosis not present

## 2024-02-02 DIAGNOSIS — Z951 Presence of aortocoronary bypass graft: Secondary | ICD-10-CM

## 2024-02-02 DIAGNOSIS — I25810 Atherosclerosis of coronary artery bypass graft(s) without angina pectoris: Secondary | ICD-10-CM

## 2024-02-02 HISTORY — PX: RIGHT/LEFT HEART CATH AND CORONARY/GRAFT ANGIOGRAPHY: CATH118267

## 2024-02-02 HISTORY — PX: CORONARY STENT INTERVENTION: CATH118234

## 2024-02-02 LAB — POCT I-STAT 7, (LYTES, BLD GAS, ICA,H+H)
Acid-Base Excess: 2 mmol/L (ref 0.0–2.0)
Bicarbonate: 26.8 mmol/L (ref 20.0–28.0)
Calcium, Ion: 1.16 mmol/L (ref 1.15–1.40)
HCT: 29 % — ABNORMAL LOW (ref 39.0–52.0)
Hemoglobin: 9.9 g/dL — ABNORMAL LOW (ref 13.0–17.0)
O2 Saturation: 93 %
Potassium: 3.8 mmol/L (ref 3.5–5.1)
Sodium: 138 mmol/L (ref 135–145)
TCO2: 28 mmol/L (ref 22–32)
pCO2 arterial: 42.3 mmHg (ref 32–48)
pH, Arterial: 7.409 (ref 7.35–7.45)
pO2, Arterial: 65 mmHg — ABNORMAL LOW (ref 83–108)

## 2024-02-02 LAB — POCT I-STAT EG7
Acid-Base Excess: 2 mmol/L (ref 0.0–2.0)
Bicarbonate: 27.2 mmol/L (ref 20.0–28.0)
Calcium, Ion: 1.19 mmol/L (ref 1.15–1.40)
HCT: 29 % — ABNORMAL LOW (ref 39.0–52.0)
Hemoglobin: 9.9 g/dL — ABNORMAL LOW (ref 13.0–17.0)
O2 Saturation: 59 %
Potassium: 3.8 mmol/L (ref 3.5–5.1)
Sodium: 138 mmol/L (ref 135–145)
TCO2: 29 mmol/L (ref 22–32)
pCO2, Ven: 44.6 mmHg (ref 44–60)
pH, Ven: 7.394 (ref 7.25–7.43)
pO2, Ven: 31 mmHg — CL (ref 32–45)

## 2024-02-02 LAB — PHOSPHORUS: Phosphorus: 2.8 mg/dL (ref 2.5–4.6)

## 2024-02-02 LAB — CBC WITH DIFFERENTIAL/PLATELET
Abs Immature Granulocytes: 0.02 10*3/uL (ref 0.00–0.07)
Basophils Absolute: 0 10*3/uL (ref 0.0–0.1)
Basophils Relative: 0 %
Eosinophils Absolute: 0.2 10*3/uL (ref 0.0–0.5)
Eosinophils Relative: 4 %
HCT: 32 % — ABNORMAL LOW (ref 39.0–52.0)
Hemoglobin: 9.9 g/dL — ABNORMAL LOW (ref 13.0–17.0)
Immature Granulocytes: 0 %
Lymphocytes Relative: 30 %
Lymphs Abs: 1.5 10*3/uL (ref 0.7–4.0)
MCH: 30.3 pg (ref 26.0–34.0)
MCHC: 30.9 g/dL (ref 30.0–36.0)
MCV: 97.9 fL (ref 80.0–100.0)
Monocytes Absolute: 0.5 10*3/uL (ref 0.1–1.0)
Monocytes Relative: 10 %
Neutro Abs: 2.7 10*3/uL (ref 1.7–7.7)
Neutrophils Relative %: 56 %
Platelets: 121 10*3/uL — ABNORMAL LOW (ref 150–400)
RBC: 3.27 MIL/uL — ABNORMAL LOW (ref 4.22–5.81)
RDW: 16.6 % — ABNORMAL HIGH (ref 11.5–15.5)
WBC: 4.9 10*3/uL (ref 4.0–10.5)
nRBC: 0 % (ref 0.0–0.2)

## 2024-02-02 LAB — COMPREHENSIVE METABOLIC PANEL
ALT: 23 U/L (ref 0–44)
AST: 37 U/L (ref 15–41)
Albumin: 3 g/dL — ABNORMAL LOW (ref 3.5–5.0)
Alkaline Phosphatase: 73 U/L (ref 38–126)
Anion gap: 9 (ref 5–15)
BUN: 34 mg/dL — ABNORMAL HIGH (ref 8–23)
CO2: 28 mmol/L (ref 22–32)
Calcium: 8.6 mg/dL — ABNORMAL LOW (ref 8.9–10.3)
Chloride: 101 mmol/L (ref 98–111)
Creatinine, Ser: 2.62 mg/dL — ABNORMAL HIGH (ref 0.61–1.24)
GFR, Estimated: 23 mL/min — ABNORMAL LOW (ref >60)
Glucose, Bld: 127 mg/dL — ABNORMAL HIGH (ref 70–99)
Potassium: 4 mmol/L (ref 3.5–5.1)
Sodium: 138 mmol/L (ref 135–145)
Total Bilirubin: 1.4 mg/dL — ABNORMAL HIGH (ref 0.0–1.2)
Total Protein: 6.3 g/dL — ABNORMAL LOW (ref 6.5–8.1)

## 2024-02-02 LAB — HEMOGLOBIN A1C
Hgb A1c MFr Bld: 6.4 % — ABNORMAL HIGH (ref 4.8–5.6)
Mean Plasma Glucose: 136.98 mg/dL

## 2024-02-02 LAB — GLUCOSE, CAPILLARY
Glucose-Capillary: 105 mg/dL — ABNORMAL HIGH (ref 70–99)
Glucose-Capillary: 105 mg/dL — ABNORMAL HIGH (ref 70–99)
Glucose-Capillary: 106 mg/dL — ABNORMAL HIGH (ref 70–99)
Glucose-Capillary: 115 mg/dL — ABNORMAL HIGH (ref 70–99)
Glucose-Capillary: 115 mg/dL — ABNORMAL HIGH (ref 70–99)
Glucose-Capillary: 119 mg/dL — ABNORMAL HIGH (ref 70–99)
Glucose-Capillary: 122 mg/dL — ABNORMAL HIGH (ref 70–99)
Glucose-Capillary: 157 mg/dL — ABNORMAL HIGH (ref 70–99)
Glucose-Capillary: 94 mg/dL (ref 70–99)

## 2024-02-02 LAB — HEPARIN LEVEL (UNFRACTIONATED): Heparin Unfractionated: >1.1 [IU]/mL — ABNORMAL HIGH (ref 0.30–0.70)

## 2024-02-02 LAB — POCT ACTIVATED CLOTTING TIME
Activated Clotting Time: 262 s
Activated Clotting Time: 268 s

## 2024-02-02 LAB — APTT: aPTT: 191 s (ref 24–36)

## 2024-02-02 LAB — MAGNESIUM: Magnesium: 2.2 mg/dL (ref 1.7–2.4)

## 2024-02-02 SURGERY — RIGHT/LEFT HEART CATH AND CORONARY/GRAFT ANGIOGRAPHY
Anesthesia: LOCAL

## 2024-02-02 MED ORDER — FENTANYL CITRATE (PF) 100 MCG/2ML IJ SOLN
INTRAMUSCULAR | Status: AC
  Filled 2024-02-02: qty 2

## 2024-02-02 MED ORDER — VERAPAMIL HCL 2.5 MG/ML IV SOLN
INTRAVENOUS | Status: DC | PRN
  Administered 2024-02-02: 200 ug via INTRACORONARY

## 2024-02-02 MED ORDER — IOHEXOL 350 MG/ML SOLN
INTRAVENOUS | Status: DC | PRN
Start: 2024-02-02 — End: 2024-02-02
  Administered 2024-02-02: 35 mL

## 2024-02-02 MED ORDER — MIDAZOLAM HCL 2 MG/2ML IJ SOLN
INTRAMUSCULAR | Status: DC | PRN
  Administered 2024-02-02: 1 mg via INTRAVENOUS

## 2024-02-02 MED ORDER — NITROGLYCERIN 1 MG/10 ML FOR IR/CATH LAB
INTRA_ARTERIAL | Status: DC | PRN
  Administered 2024-02-02 (×2): 200 ug via INTRACORONARY

## 2024-02-02 MED ORDER — SODIUM CHLORIDE 0.9 % IV SOLN: 250.0000 mL | INTRAVENOUS | Status: AC | PRN

## 2024-02-02 MED ORDER — CLOPIDOGREL BISULFATE 300 MG PO TABS
ORAL_TABLET | ORAL | Status: AC
  Filled 2024-02-02: qty 1

## 2024-02-02 MED ORDER — FENTANYL CITRATE (PF) 100 MCG/2ML IJ SOLN
INTRAMUSCULAR | Status: DC | PRN
  Administered 2024-02-02: 25 ug via INTRAVENOUS

## 2024-02-02 MED ORDER — APIXABAN 2.5 MG PO TABS
2.5000 mg | ORAL_TABLET | Freq: Two times a day (BID) | ORAL | Status: DC
  Administered 2024-02-03: 2.5 mg via ORAL
  Filled 2024-02-02: qty 1

## 2024-02-02 MED ORDER — LIDOCAINE HCL (PF) 1 % IJ SOLN
INTRAMUSCULAR | Status: AC
Start: 2024-02-02 — End: ?
  Filled 2024-02-02: qty 30

## 2024-02-02 MED ORDER — FAMOTIDINE IN NACL 20-0.9 MG/50ML-% IV SOLN
INTRAVENOUS | Status: AC | PRN
  Administered 2024-02-02: 20 mg via INTRAVENOUS

## 2024-02-02 MED ORDER — HEPARIN SODIUM (PORCINE) 1000 UNIT/ML IJ SOLN
INTRAMUSCULAR | Status: AC
  Filled 2024-02-02: qty 10

## 2024-02-02 MED ORDER — FAMOTIDINE IN NACL 20-0.9 MG/50ML-% IV SOLN
INTRAVENOUS | Status: AC
  Filled 2024-02-02: qty 50

## 2024-02-02 MED ORDER — SODIUM CHLORIDE 0.9% FLUSH: 3.0000 mL | INTRAVENOUS | Status: DC | PRN

## 2024-02-02 MED ORDER — CLOPIDOGREL BISULFATE 300 MG PO TABS
ORAL_TABLET | ORAL | Status: DC | PRN
  Administered 2024-02-02: 600 mg via ORAL

## 2024-02-02 MED ORDER — NITROGLYCERIN 1 MG/10 ML FOR IR/CATH LAB
INTRA_ARTERIAL | Status: AC
  Filled 2024-02-02: qty 10

## 2024-02-02 MED ORDER — MIDAZOLAM HCL 2 MG/2ML IJ SOLN
INTRAMUSCULAR | Status: AC
  Filled 2024-02-02: qty 2

## 2024-02-02 MED ORDER — HEPARIN (PORCINE) IN NACL 1000-0.9 UT/500ML-% IV SOLN
INTRAVENOUS | Status: DC | PRN
  Administered 2024-02-02 (×2): 500 mL

## 2024-02-02 MED ORDER — SODIUM CHLORIDE 0.9% FLUSH
3.0000 mL | Freq: Two times a day (BID) | INTRAVENOUS | Status: DC
  Administered 2024-02-02 – 2024-02-03 (×2): 3 mL via INTRAVENOUS

## 2024-02-02 MED ORDER — LIDOCAINE HCL (PF) 1 % IJ SOLN
INTRAMUSCULAR | Status: DC | PRN
  Administered 2024-02-02: 15 mL

## 2024-02-02 MED ORDER — VERAPAMIL HCL 2.5 MG/ML IV SOLN
INTRAVENOUS | Status: AC
Start: 2024-02-02 — End: ?
  Filled 2024-02-02: qty 2

## 2024-02-02 SURGICAL SUPPLY — 30 items
BALLN EMERGE MR 2.5X15 (BALLOONS) ×1 IMPLANT
BALLOON EMERGE MR 2.5X15 (BALLOONS) IMPLANT
CATH INFINITI 5 FR 3DRC (CATHETERS) IMPLANT
CATH INFINITI 5 FR IM (CATHETERS) IMPLANT
CATH INFINITI 5FR MULTPACK ANG (CATHETERS) IMPLANT
CATH LAUNCHER 5F AL1 (CATHETERS) IMPLANT
CATH SWAN GANZ 7F STRAIGHT (CATHETERS) IMPLANT
CATH VISTA GUIDE 6FR AL1 MULPK (CATHETERS) IMPLANT
CATHETER LAUNCHER 5F AL1 (CATHETERS) ×1 IMPLANT
CLOSURE MYNX CONTROL 6F/7F (Vascular Products) IMPLANT
CLOSURE PERCLOSE PROSTYLE (VASCULAR PRODUCTS) IMPLANT
COVER PRB 48X5XTLSCP FOLD TPE (BAG) IMPLANT
GLIDESHEATH SLEND SS 6F .021 (SHEATH) IMPLANT
GUIDEWIRE ANGLED .035X150CM (WIRE) IMPLANT
GUIDEWIRE INQWIRE 1.5J.035X260 (WIRE) IMPLANT
INQWIRE 1.5J .035X260CM (WIRE) ×1 IMPLANT
KIT ENCORE 26 ADVANTAGE (KITS) IMPLANT
KIT MICROPUNCTURE NIT STIFF (SHEATH) IMPLANT
PACK CARDIAC CATHETERIZATION (CUSTOM PROCEDURE TRAY) ×2 IMPLANT
PINNACLE LONG 6F 25CM (SHEATH) ×1 IMPLANT
SHEATH INTRO PINNACLE 6F 25CM (SHEATH) IMPLANT
SHEATH PINNACLE 5F 10CM (SHEATH) IMPLANT
SHEATH PINNACLE 6F 10CM (SHEATH) IMPLANT
SHEATH PINNACLE 7F 10CM (SHEATH) IMPLANT
STENT SYNERGY XD 3.50X12 (Permanent Stent) IMPLANT
SYNERGY XD 3.50X12 (Permanent Stent) ×1 IMPLANT
TUBING CIL FLEX 10 FLL-RA (TUBING) IMPLANT
WIRE EMERALD 3MM-J .035X150CM (WIRE) IMPLANT
WIRE MICROINTRODUCER 60CM (WIRE) IMPLANT
WIRE RUNTHROUGH .014X180CM (WIRE) IMPLANT

## 2024-02-02 NOTE — TOC Progression Note (Signed)
 Transition of Care [facility]) - Progression Note    Patient Details  Name: [name] MRN: [id number] Date of Birth: 10/02/1932  Transition of Care [facility]) CM/SW Contact  Gordy Clement, RN Phone Number: 02/02/2024, 11:59 AM  Clinical Narrative:     Patient has been recommended Home Health PT and OT  Patient has had services with [facility] and will be reconnected with them after discharge  Home Health orders will be needed   TOC will continue to follow patient for any additional discharge needs            Expected Discharge Plan and Services                                               Social Determinants of Health (SDOH) Interventions SDOH Screenings   Food Insecurity: No Food Insecurity (01/31/2024)  Housing: Low Risk  (01/31/2024)  Transportation Needs: No Transportation Needs (01/31/2024)  Utilities: Not At Risk (01/31/2024)  Alcohol Screen: Low Risk  (10/31/2023)  Depression (PHQ2-9): Low Risk  (01/23/2024)  Recent Concern: Depression (PHQ2-9) - Medium Risk (01/09/2024)  Financial Resource Strain: Low Risk  (10/31/2023)  Physical Activity: Inactive (10/31/2023)  Social Connections: Moderately Isolated (01/31/2024)  Stress: No Stress Concern Present (10/31/2023)  Tobacco Use: Medium Risk (01/31/2024)  Health Literacy: Adequate Health Literacy (10/31/2023)    Readmission Risk Interventions     No data to display

## 2024-02-02 NOTE — Progress Notes (Signed)
 Patient Name: [name] Date of Encounter: 02/02/2024 [facility] Cardiologist: Nanetta Batty, MD   Interval Summary  .    Hard of hearing ready for cath latter today with Dr Excell Seltzer   Vital Signs .    Vitals:   02/01/24 1930 02/01/24 2313 02/02/24 0728 02/02/24 0856  BP: 137/66 (!) 149/65 (!) 123/52 (!) 127/57  Pulse: 76 65 66 73  Resp: 14 17 13    Temp: 98 F (36.7 C) 97.7 F (36.5 C) 97.7 F (36.5 C)   TempSrc: Oral Oral Oral   SpO2: 100% 100% 96%   Weight:      Height:        Intake/Output Summary (Last 24 hours) at 02/02/2024 0938 Last data filed at 02/02/2024 0859 Gross per 24 hour  Intake 621.1 ml  Output 1625 ml  Net -1003.9 ml      02/01/2024   12:00 AM 01/31/2024    9:34 AM 01/23/2024   11:42 AM  Last 3 Weights  Weight (lbs) 159 lb 13.3 oz 154 lb 9.6 oz --  Weight (kg) 72.5 kg 70.126 kg --      Telemetry/ECG    Sinus 60-70- Personally Reviewed  CV Studies    Echocardiogram 12/13/2023  1. Left ventricular ejection fraction, by estimation, is 30 to 35%. The  left ventricle has moderately decreased function. The left ventricle  demonstrates global hypokinesis. There is moderate concentric left  ventricular hypertrophy. Left ventricular  diastolic function could not be evaluated.   2. Right ventricular systolic function is moderately reduced. The right  ventricular size is normal. There is moderately elevated pulmonary artery  systolic pressure. The estimated right ventricular systolic pressure is  54.7 mmHg.   3. The mitral valve has been repaired/replaced. There is a single  MitraClip NTW placed on A2/P2 Mitra-Clip present in the mitral position.  Procedure Date: 02/06/2020. The mitra Clip is well seated. The mean MVG is  consistent with mild mitral  stenosis. Mild mitral regurgitation is present.   4. The aortic valve is calcified. There is severe calcifcation of the  aortic valve. There is severe thickening of the aortic  valve. Aortic valve  regurgitation is mild. Aortic valve mean gradient measures 12.0 mmHg.  Aortic valve Vmax measures 2.42 m/s.  low DVI 0.22 and SVI 20. Despite a low AVG and Vmax, all findings suggest  at least moderate and likely moderate to severe low flow low gradient  aortic stenosis in setting of HFrEF.   5. The inferior vena cava is dilated in size with <50% respiratory  variability, suggesting right atrial pressure of 15 mmHg.   6. Ascending aorta measurements are within normal limits for age when  indexed to body surface area.   7. Evidence of residual atrial level shunting detected by color flow  Doppler from prior transseptal puncture site .   8. Compared to prior study dated 11/24/22, the mean AVG has increased from  to , Vmax has icnreased from 1.73m/s to 2.40m/s, DVI has  decreased from 0.34 to 0.22. Now at least moderate and likely moderate to  severe low flow low gradient Aortic  stenosis. Stable Mitra-Clip with minimal increase in mean MVG from 4 to  .    Physical Exam .   GEN: No acute distress.   Neck: + JVD Cardiac: RRR, no murmurs, rubs, or gallops.  Respiratory: Slight crackles GI: Soft, nontender, non-distended  MS: 2+ pitting edema, L>R  Patient Profile  [name] is a 89 y.o. male has hx of  CAD s/p multiple interventions (CABG in 1996, redo CABG in 2006, and PCI with DES to SVG-OM graft in 05/2014), Plavix non-responder, chronic HFrEF with EF of 30-35% in 11/2023, permanent atrial fibrillation on Eliquis, mitral regurgitation s/p MitraClip in 01/2020 with mild residual regurgitation, severe low flow low gradient aortic stenosis (currently being worked up for TAVR), bilateral carotid stenosis s/p right CEA in 1994, hypertension, hyperlipidemia, CKD stage IIIb, obstructive sleep apnea, and GI bleeding s/p left sided hemicolectomy and sigmoidectomy.  Cardiology initially saw for concerns of CHF exacerbation, admitted for persistent  hypoglycemia.  Assessment & Plan .     Chronic HFrEF  11/2023 EF 30 to 35% with moderately reduced RV function.  RVSP 54.7.  For some reason was given IV fluids today.  Volume status looks worse today,   although asymptomatic. Stop IV fluids. Will give one dose of IV lasix 40mg  now and hold night torsemide.  PTA was on torsemide 40 mg in the morning and 20 mg at night. GDMT limited by renal function: On hydralazine 25 mg daily, Imdur 90 mg, Toprol-XL 100 mg.  Holding Jardiance in setting of hypoglycemia. Cr stable this am 2.62. will need some contrast today given two prior CABGls to look at grafts. No LV Cross valve to assess CO and gradients as well as filling pressures  He understands risk of worsening renal failure and need for further contrast with TAVR CTA so long as CAD not prohibitive   Severe low-flow low gradient Mean gradient 12, DVI 0.22, SVI, 0.42.  Since he is here and already had tentative plans for cardiac cath for TAVR workup, will move up and will plan for cath today  Informed Consent   Shared Decision Making/Informed Consent The risks [stroke (1 in 1000), death (1 in 1000), kidney failure [usually temporary] (1 in 500), bleeding (1 in 200), allergic reaction [possibly serious] (1 in 200)], benefits (diagnostic support and management of coronary artery disease) and alternatives of a cardiac catheterization were discussed in detail with Mr. Bruun and he is willing to proceed.     MVR status post mitral clip 2021 Mild residual MR.  Stable.  Continue to follow  CAD status post CABG  Extensive hx CABG in 1996, redo CABG in 2006, and PCI with DES to SVG-OM graft in 2015.  Stable disease, no anginal complaints Continue: crestor 10mg  (LDL 67), Toprol XL, Imdur 90mg  daily, and Ranexa 500mg  twice daily.  Already on aspirin and Eliquis   Permanent atrial fibrillation Rate controlled heart rates in the 60s and 70s. Continue with Toprol-XL 100 mg daily.  PTA on Eliquis 2.5 mg. On  IV heparin. Transition back to eliquis post cath   Hypoglycemia  Type 2 diabetes In setting of recent GI blood and not associated.  Management per primary team.   For questions or updates, please contact [facility] Please consult [email or web address] for contact info under        Signed, Charlton Haws, MD

## 2024-02-02 NOTE — Progress Notes (Signed)
 Date and time results received: 02/02/24 0927 (use smartphrase ".now" to insert current time)  Test: APTT Critical Value: 191  Name of Provider Notified: Marland Mcalpine  Orders Received? Or Actions Taken?: Actions Taken: STAT redraw level as Pharmacy suspects it is not correct

## 2024-02-02 NOTE — Hospital Course (Addendum)
 89 y.o. male with medical history significant of macular degeneration, afib, CAD, stage 4 CKD, chronic systolic CHF, HTN, HLD, DM, and OSA on CPAP who presented on 3/19 with hypoglycemia.  Patient apparently took more insulin than he should have for unclear reasons.  There was also concern for congestive heart failure exacerbation from his aortic stenosis.  Patient was subsequently hospitalized for further management.   Patient underwent cardiac catheterization is planned for a workup for his TAVR and and is status post phenous vein graft intervention on a background of permanent atrial fibrillation.    Cardiology initially triple therapy for now and stopping aspirin after a month and continuing with clopidogrel for 6 months.  But after further discussion it was found that he is a nonresponder to Plavix was shared decision was made to stop the aspirin and Plavix and change the patient to Brilinta and continue Brilinta and apixaban at discharge.  PT OT evaluated and he did well and recommended home health.  He is medically stable and cleared by cardiology to follow-up in outpatient setting with PCP and cardiology.  Assessment and Plan:  Hypoglycemia: Apparently took more insulin than he should have.  He also had recent GI illness as a result of which she had poor oral intake.  I think both these issues caused a significant drop in his glucose levels. Lantus was held. CBGs have stabilized.  Will change to before meals and at bedtime checks continue to monitor CBGs.  Continue to hold insulin.  Check HbA1c was 6.4.  Last HbA1c was 7.0 in December.  Continue monitor CBGs per protocol ranging from 94-142.  Will discontinue insulin at discharge.   Chronic Systolic CHF/Severe aortic stenosis/coronary artery disease s/p stenting/mitral regurgitation: Followed by Cardiology.  ECHOCardiogram from January showed EF of 30 to 35%. Seen by cardiology during this admission.  Underwent Cardiac Cath as TAVR workup for aortic  stenosis.  Cath was done and he had a stent placed and is now on antiplatelets however per discussion with cardiology we will change his aspirin Plavix suggest Brilinta and continue apixaban. Cardiology does not feel that he needs a TAVR given that he has moderate aortic stenosis. Cardiac status otherwise noted to be stable. He is noted to be on aspirin, hydralazine, isosorbide mononitrate, metoprolol, Ranexa, rosuvastatin and torsemide.He is status post MitraClip in 2021.  Other care per cardiology.  Cardiology has now stopped his aspirin Plavix given his his nonresponder to Plavix from his P2 Y12 assay which was 266. -Cardiology recommends Brilinta loading dose prior to discharge and maintenance dose of 90 mg twice daily starting 323 along with apixaban   History of Atrial Fibrillation: Continue metoprolol.  Resume Eliquis and follow-up with cardiology in outpatient setting.   Essential Hypertension: Monitor blood pressures closely. Last BP reading was 107/58   Normocytic Anemia: Hemoglobin has been on the lower side but stable for the most part. Hgb/Hct is now 9.9/31.2. Check Anemia Panel in the outpatient setting. CTM for S/Sx of Bleeding and repeat CBC within 1 week   CKD Stage IV -Baseline creatinine is between 2.7-3.0.  Creatinine close to baseline.  -BUN/Cr Trend: Recent Labs  Lab 01/09/24 1529 01/31/24 0950 02/01/24 0231 02/02/24 0802 02/03/24 0209  BUN 47* 45* 38* 34* 34*  CREATININE 2.92* 2.89* 2.77* 2.62* 2.62*  -Apparently had been taking ibuprofen at home and he was told not to ever take this medicine or any other NSAIDs. -Avoid Nephrotoxic Medications, Contrast Dyes, Hypotension and Dehydration to Ensure Adequate Renal Perfusion  and will need to Renally Adjust Meds. CTM and Trend Renal Function carefully and repeat CMP in the AM   Obstructive Sleep Apnea: CPAP  Hypoalbuminemia: Patient's Albumin Trend: Recent Labs  Lab 01/31/24 0950 02/02/24 0802 02/03/24 0209  ALBUMIN  3.1* 3.0* 3.0*  -Continue to Monitor and Trend and repeat CMP in the AM  Overweight: Complicates overall prognosis and care. Estimated body mass index is 25.03 kg/m as calculated from the following:   Height as of this encounter: 5\' 7"  (1.702 m).   Weight as of this encounter: 72.5 kg. Weight Loss and Dietary Counseling given

## 2024-02-02 NOTE — Interval H&P Note (Signed)
 History and Physical Interval Note:  02/02/2024 10:00 AM  [name]  has presented today for surgery, with the diagnosis of aortic stenosis.  The various methods of treatment have been discussed with the patient and family. After consideration of risks, benefits and other options for treatment, the patient has consented to  Procedure(s): RIGHT/LEFT HEART CATH AND CORONARY/GRAFT ANGIOGRAPHY (N/A) as a surgical intervention.  The patient's history has been reviewed, patient examined, no change in status, stable for surgery.  I have reviewed the patient's chart and labs.  Questions were answered to the patient's satisfaction.     Tonny Bollman

## 2024-02-02 NOTE — Progress Notes (Signed)
 PROGRESS NOTE    [name]  GUY:[id number] DOB: Nov 08, 1932 DOA: 01/31/2024 PCP: Joaquim Nam, MD   Brief Narrative:  89 y.o. male with medical history significant of macular degeneration, afib, CAD, stage 4 CKD, chronic systolic CHF, HTN, HLD, DM, and OSA on CPAP who presented on 3/19 with hypoglycemia.  Patient apparently took more insulin than he should have for unclear reasons.  There was also concern for congestive heart failure exacerbation from his aortic stenosis.  Patient was subsequently hospitalized for further management.   Patient underwent cardiac catheterization is planned for a workup for his TAVR and and is status post phenous vein graft intervention on a background of permanent atrial fibrillation.  Cardiology recommends triple therapy for now and stopping aspirin after a month and continuing with clopidogrel for 6 months.  They are recommending resuming anticoagulation tomorrow morning.  They will discuss his case at the disciplinary heart valve team and feel that the patient be discharged tomorrow along his renal function remains normal.  Assessment and Plan:  Hypoglycemia: Apparently took more insulin than he should have.  He also had recent GI illness as a result of which she had poor oral intake.  I think both these issues caused a significant drop in his glucose levels. Lantus was held. CBGs have stabilized.  Will change to before meals and at bedtime checks continue to monitor CBGs.  Continue to hold insulin.  Check HbA1c was 6.4.  Last HbA1c was 7.0 in December.  Continue monitor CBGs per protocol ranging from 94-142   Chronic Systolic CHF/Severe aortic stenosis/coronary artery disease s/p /mitral regurgitation: Followed by Cardiology.  ECHOCardiogram from January showed EF of 30 to 35%. Seen by cardiology during this admission.  Underwent Cardiac Cath as TAVR workup for aortic stenosis.  Cath was done and he had a stent placed and is now on antiplatelets. Cardiology  does not feel that he needs a TAVR given that he has moderate aortic stenosis. Cardiac status otherwise noted to be stable. He is noted to be on aspirin, hydralazine, isosorbide mononitrate, metoprolol, Ranexa, rosuvastatin and torsemide.He is status post MitraClip in 2021.  Other care per cardiology   History of Atrial Fibrillation: Continue metoprolol.  Eliquis currently on hold for cardiac cath which was done 3/21.  Per cardiology they will resume anticoagulation tomorrow morning   Essential Hypertension: Monitor blood pressures closely. Last BP reading was 107/58   Normocytic Anemia: Hemoglobin has been on the lower side but stable for the most part. Hgb/Hct is now 9.9/29.0. Check Anemia Panel in the AM. CTM for S/Sx of Bleeding   CKD Stage IV -Baseline creatinine is between 2.7-3.0.  Creatinine close to baseline.  -BUN/Cr Trend: Recent Labs  Lab 01/09/24 1529 01/31/24 0950 02/01/24 0231 02/02/24 0802  BUN 47* 45* 38* 34*  CREATININE 2.92* 2.89* 2.77* 2.62*  -Apparently had been taking ibuprofen at home and he was told not to ever take this medicine or any other NSAIDs. -Avoid Nephrotoxic Medications, Contrast Dyes, Hypotension and Dehydration to Ensure Adequate Renal Perfusion and will need to Renally Adjust Meds. CTM and Trend Renal Function carefully and repeat CMP in the AM   Obstructive Sleep Apnea: CPAP  Hypoalbuminemia: Patient's Albumin Trend: Recent Labs  Lab 01/31/24 0950 02/02/24 0802  ALBUMIN 3.1* 3.0*  -Continue to Monitor and Trend and repeat CMP in the AM  Overweight: Complicates overall prognosis and care. Estimated body mass index is 25.03 kg/m as calculated from the following:   Height as of  this encounter: 5\' 7"  (1.702 m).   Weight as of this encounter: 72.5 kg. Weight Loss and Dietary Counseling given   DVT prophylaxis: apixaban (ELIQUIS) tablet 2.5 mg Start: 02/03/24 1000 Place and maintain sequential compression device Start: 01/31/24 1745 apixaban  (ELIQUIS) tablet 2.5 mg    Code Status: Limited: Do not attempt resuscitation (DNR) -DNR-LIMITED -Do Not Intubate/DNI  Family Communication: No family currently at bedside  Disposition Plan:  Level of care: Progressive Status is: Inpatient Remains inpatient appropriate because: His further cardiac clearance and workup and evaluation by PT/OT.   Consultants:  Cardiology  Procedures:  Cardiac Catheterization  Antimicrobials:  Anti-infectives (From admission, onward)    None       Subjective: Seen and examined at bedside he was doing okay today.  Denies any chest pain or shortness breath.  Feels okay.  No other concerns or complaints at this time and awaiting to go to cardiac catheterization  Objective: Vitals:   02/02/24 1300 02/02/24 1330 02/02/24 1400 02/02/24 1600  BP: (!) 130/57 (!) 125/59 (!) 121/57 (!) 107/58  Pulse: 70 (!) 56 64 84  Resp: (!) 22 16 12 13   Temp:    97.6 F (36.4 C)  TempSrc:    Oral  SpO2: 98% 97% 97% 98%  Weight:      Height:        Intake/Output Summary (Last 24 hours) at 02/02/2024 1750 Last data filed at 02/02/2024 1506 Gross per 24 hour  Intake 545.57 ml  Output 600 ml  Net -54.43 ml   Filed Weights   01/31/24 0934 02/01/24 0000  Weight: 70.1 kg 72.5 kg   Examination: Physical Exam:  Constitutional: WN/WD overweight elderly Caucasian male in NAD Respiratory: Diminished to auscultation bilaterally, no wheezing, rales, rhonchi or crackles. Normal respiratory effort and patient is not tachypenic. No accessory muscle use. Unlabored breathing  Cardiovascular: RRR, Slight Systolic murmur appreciated.. S1 and S2 auscultated. No extremity edema.  Abdomen: Soft, non-tender, Distended 2/2 body habitus. Bowel sounds positive.  GU: Deferred. Musculoskeletal: No clubbing / cyanosis of digits/nails. No joint deformity upper and lower extremities.  Skin: No rashes, lesions, ulcers on a limited skin evaluation. No induration; Warm and dry.   Neurologic: CN 2-12 grossly intact with no focal deficits. Romberg sign and cerebellar reflexes not assessed.  Psychiatric: Normal judgment and insight. Alert and oriented x 3.   Data Reviewed: I have personally reviewed following labs and imaging studies  CBC: Recent Labs  Lab 01/31/24 0950 02/01/24 0231 02/02/24 0802 02/02/24 1031 02/02/24 1032  WBC 5.6 5.3 4.9  --   --   NEUTROABS 4.1  --  2.7  --   --   HGB 10.2* 9.7* 9.9* 9.9* 9.9*  HCT 33.0* 31.3* 32.0* 29.0* 29.0*  MCV 101.2* 97.5 97.9  --   --   PLT 140* 129* 121*  --   --    Basic Metabolic Panel: Recent Labs  Lab 01/31/24 0950 02/01/24 0231 02/02/24 0802 02/02/24 1031 02/02/24 1032  NA 137 137 138 138 138  K 3.7 3.9 4.0 3.8 3.8  CL 100 102 101  --   --   CO2 22 26 28   --   --   GLUCOSE 51* 98 127*  --   --   BUN 45* 38* 34*  --   --   CREATININE 2.89* 2.77* 2.62*  --   --   CALCIUM 8.5* 8.4* 8.6*  --   --   MG  --   --  2.2  --   --   PHOS  --   --  2.8  --   --    GFR: Estimated Creatinine Clearance: 17.9 mL/min (A) (by C-G formula based on SCr of 2.62 mg/dL (H)). Liver Function Tests: Recent Labs  Lab 01/31/24 0950 02/02/24 0802  AST 50* 37  ALT 21 23  ALKPHOS 69 73  BILITOT 1.8* 1.4*  PROT 6.7 6.3*  ALBUMIN 3.1* 3.0*   No results for input(s): "LIPASE", "AMYLASE" in the last 168 hours. No results for input(s): "AMMONIA" in the last 168 hours. Coagulation Profile: No results for input(s): "INR", "PROTIME" in the last 168 hours. Cardiac Enzymes: No results for input(s): "CKTOTAL", "CKMB", "CKMBINDEX", "TROPONINI" in the last 168 hours. BNP (last 3 results) No results for input(s): "PROBNP" in the last 8760 hours. HbA1C: Recent Labs    02/02/24 0755  HGBA1C 6.4*   CBG: Recent Labs  Lab 02/02/24 0557 02/02/24 0808 02/02/24 0950 02/02/24 1157 02/02/24 1611  GLUCAP 122* 105* 106* 94 119*   Lipid Profile: No results for input(s): "CHOL", "HDL", "LDLCALC", "TRIG", "CHOLHDL",  "LDLDIRECT" in the last 72 hours. Thyroid Function Tests: No results for input(s): "TSH", "T4TOTAL", "FREET4", "T3FREE", "THYROIDAB" in the last 72 hours. Anemia Panel: No results for input(s): "VITAMINB12", "FOLATE", "FERRITIN", "TIBC", "IRON", "RETICCTPCT" in the last 72 hours. Sepsis Labs: No results for input(s): "PROCALCITON", "LATICACIDVEN" in the last 168 hours.  Recent Results (from the past 240 hours)  MRSA Next Gen by PCR, Nasal     Status: None   Collection Time: 01/31/24  8:37 PM   Specimen: Nasal Mucosa; Nasal Swab  Result Value Ref Range Status   MRSA by PCR Next Gen NOT DETECTED NOT DETECTED Final    Comment: (NOTE) The GeneXpert MRSA Assay (FDA approved for NASAL specimens only), is one component of a comprehensive MRSA colonization surveillance program. It is not intended to diagnose MRSA infection nor to guide or monitor treatment for MRSA infections. Test performance is not FDA approved in patients less than 51 years old. Performed at [facility], [address], [place], [place] [address]     Radiology Studies: CARDIAC CATHETERIZATION Result Date: 02/02/2024 1.  Severe native vessel coronary artery disease with total occlusion of the proximal LAD, total occlusion of the ostial RCA, and stable severe stenosis of the circumflex 2.  Status post CABG with continued patency of the LIMA to LAD and patency of the SVG to diagonal with type stenosis in the proximal body of the graft at the edge of the previously stented segment, treated with a 3.5 x 12 mm Synergy DES 3.  Normal left heart diastolic filling pressures with a pulmonary wedge pressure of 18 and an LVEDP of 15 mmHg 4.  Moderately elevated right heart pressures with a mean right atrial pressure of 12 mmHg and a pulmonary artery pressure of 55/17 mean 31 mmHg, transpulmonary gradient 13 mmHg, PVR 2.4 Wood units. 5.  Mild aortic stenosis by direct catheter measurement with a mean transvalvular gradient of 6 mmHg  and calculated valve area of 2.3 cm Recommendations: Patient now status post saphenous vein graft intervention on a background of permanent atrial fibrillation.  Recommend that he resume apixaban 2.5 mg twice daily tomorrow morning.  Recommend aspirin 81 mg x 30 days then stop.  Recommend clopidogrel 75 mg daily x 6 months as tolerated.  I suspect the patient has moderate aortic stenosis and I would favor ongoing observation and medical therapy considering his  advanced age and significant comorbidities.  I am not convinced that proceeding with TAVR would benefit him and I think it would expose him to significant risk of renal failure as he would still require CT angiography studies and the TAVR procedure.  Will review his case with our multidisciplinary heart valve team, but he can be discharged tomorrow from the hospital as long as his kidney function is stable.  He received 35 cc of contrast for today's procedure.  Scheduled Meds:  allopurinol  50 mg Oral Q M,W,F   [START ON 02/03/2024] apixaban  2.5 mg Oral BID   aspirin EC  81 mg Oral Daily   hydrALAZINE  25 mg Oral QHS   isosorbide mononitrate  90 mg Oral Daily   magnesium oxide  400 mg Oral QHS   metoprolol succinate  100 mg Oral Daily   pantoprazole  40 mg Oral Daily   polyethylene glycol  17 g Oral Daily   ranolazine  500 mg Oral BID   rosuvastatin  10 mg Oral q AM   sodium chloride flush  3 mL Intravenous Q12H   sodium chloride flush  3 mL Intravenous Q12H   torsemide  20 mg Oral Daily   torsemide  40 mg Oral Daily   Continuous Infusions:  sodium chloride      LOS: 1 day   Marguerita Merles, DO [facility] Available via Epic secure chat 7am-7pm After these hours, please refer to coverage provider listed on [email or web address] 02/02/2024, 5:50 PM

## 2024-02-02 NOTE — Progress Notes (Signed)
 Mobility Specialist Progress Note:    02/02/24 1510  Mobility  Activity Ambulated with assistance in hallway;Transferred from bed to chair  Level of Assistance Contact guard assist, steadying assist  Assistive Device Front wheel walker  Distance Ambulated (ft) 100 ft  Activity Response Tolerated well  Mobility Referral Yes  Mobility visit 1 Mobility  Mobility Specialist Start Time (ACUTE ONLY) 1510  Mobility Specialist Stop Time (ACUTE ONLY) 1520  Mobility Specialist Time Calculation (min) (ACUTE ONLY) 10 min   Pt received in bed, agreeable to mobility session. Ambulated in hallway with CGA and RW. Tolerated well, VSS throughout. Returned pt to room, sitting up in chair comfortably with all needs met.  Chair alarm on, call bell in reach.   Feliciana Rossetti Mobility Specialist Please contact via Special educational needs teacher or  Rehab office at [phone]

## 2024-02-02 NOTE — H&P (View-Only) (Signed)
 Patient Name: [name] Date of Encounter: 02/02/2024 [facility] Cardiologist: Nanetta Batty, MD   Interval Summary  .    Hard of hearing ready for cath latter today with Dr Excell Seltzer   Vital Signs .    Vitals:   02/01/24 1930 02/01/24 2313 02/02/24 0728 02/02/24 0856  BP: 137/66 (!) 149/65 (!) 123/52 (!) 127/57  Pulse: 76 65 66 73  Resp: 14 17 13    Temp: 98 F (36.7 C) 97.7 F (36.5 C) 97.7 F (36.5 C)   TempSrc: Oral Oral Oral   SpO2: 100% 100% 96%   Weight:      Height:        Intake/Output Summary (Last 24 hours) at 02/02/2024 0938 Last data filed at 02/02/2024 0859 Gross per 24 hour  Intake 621.1 ml  Output 1625 ml  Net -1003.9 ml      02/01/2024   12:00 AM 01/31/2024    9:34 AM 01/23/2024   11:42 AM  Last 3 Weights  Weight (lbs) 159 lb 13.3 oz 154 lb 9.6 oz --  Weight (kg) 72.5 kg 70.126 kg --      Telemetry/ECG    Sinus 60-70- Personally Reviewed  CV Studies    Echocardiogram 12/13/2023  1. Left ventricular ejection fraction, by estimation, is 30 to 35%. The  left ventricle has moderately decreased function. The left ventricle  demonstrates global hypokinesis. There is moderate concentric left  ventricular hypertrophy. Left ventricular  diastolic function could not be evaluated.   2. Right ventricular systolic function is moderately reduced. The right  ventricular size is normal. There is moderately elevated pulmonary artery  systolic pressure. The estimated right ventricular systolic pressure is  54.7 mmHg.   3. The mitral valve has been repaired/replaced. There is a single  MitraClip NTW placed on A2/P2 Mitra-Clip present in the mitral position.  Procedure Date: 02/06/2020. The mitra Clip is well seated. The mean MVG is  consistent with mild mitral  stenosis. Mild mitral regurgitation is present.   4. The aortic valve is calcified. There is severe calcifcation of the  aortic valve. There is severe thickening of the aortic  valve. Aortic valve  regurgitation is mild. Aortic valve mean gradient measures 12.0 mmHg.  Aortic valve Vmax measures 2.42 m/s.  low DVI 0.22 and SVI 20. Despite a low AVG and Vmax, all findings suggest  at least moderate and likely moderate to severe low flow low gradient  aortic stenosis in setting of HFrEF.   5. The inferior vena cava is dilated in size with <50% respiratory  variability, suggesting right atrial pressure of 15 mmHg.   6. Ascending aorta measurements are within normal limits for age when  indexed to body surface area.   7. Evidence of residual atrial level shunting detected by color flow  Doppler from prior transseptal puncture site .   8. Compared to prior study dated 11/24/22, the mean AVG has increased from  to , Vmax has icnreased from 1.73m/s to 2.40m/s, DVI has  decreased from 0.34 to 0.22. Now at least moderate and likely moderate to  severe low flow low gradient Aortic  stenosis. Stable Mitra-Clip with minimal increase in mean MVG from 4 to  .    Physical Exam .   GEN: No acute distress.   Neck: + JVD Cardiac: RRR, no murmurs, rubs, or gallops.  Respiratory: Slight crackles GI: Soft, nontender, non-distended  MS: 2+ pitting edema, L>R  Patient Profile  CATHERINE OAK is a 89 y.o. male has hx of  CAD s/p multiple interventions (CABG in 1996, redo CABG in 2006, and PCI with DES to SVG-OM graft in 05/2014), Plavix non-responder, chronic HFrEF with EF of 30-35% in 11/2023, permanent atrial fibrillation on Eliquis, mitral regurgitation s/p MitraClip in 01/2020 with mild residual regurgitation, severe low flow low gradient aortic stenosis (currently being worked up for TAVR), bilateral carotid stenosis s/p right CEA in 1994, hypertension, hyperlipidemia, CKD stage IIIb, obstructive sleep apnea, and GI bleeding s/p left sided hemicolectomy and sigmoidectomy.  Cardiology initially saw for concerns of CHF exacerbation, admitted for persistent  hypoglycemia.  Assessment & Plan .     Chronic HFrEF  11/2023 EF 30 to 35% with moderately reduced RV function.  RVSP 54.7.  For some reason was given IV fluids today.  Volume status looks worse today,   although asymptomatic. Stop IV fluids. Will give one dose of IV lasix 40mg  now and hold night torsemide.  PTA was on torsemide 40 mg in the morning and 20 mg at night. GDMT limited by renal function: On hydralazine 25 mg daily, Imdur 90 mg, Toprol-XL 100 mg.  Holding Jardiance in setting of hypoglycemia. Cr stable this am 2.62. will need some contrast today given two prior CABGls to look at grafts. No LV Cross valve to assess CO and gradients as well as filling pressures  He understands risk of worsening renal failure and need for further contrast with TAVR CTA so long as CAD not prohibitive   Severe low-flow low gradient Mean gradient 12, DVI 0.22, SVI, 0.42.  Since he is here and already had tentative plans for cardiac cath for TAVR workup, will move up and will plan for cath today  Informed Consent   Shared Decision Making/Informed Consent The risks [stroke (1 in 1000), death (1 in 1000), kidney failure [usually temporary] (1 in 500), bleeding (1 in 200), allergic reaction [possibly serious] (1 in 200)], benefits (diagnostic support and management of coronary artery disease) and alternatives of a cardiac catheterization were discussed in detail with Mr. Bruun and he is willing to proceed.     MVR status post mitral clip 2021 Mild residual MR.  Stable.  Continue to follow  CAD status post CABG  Extensive hx CABG in 1996, redo CABG in 2006, and PCI with DES to SVG-OM graft in 2015.  Stable disease, no anginal complaints Continue: crestor 10mg  (LDL 67), Toprol XL, Imdur 90mg  daily, and Ranexa 500mg  twice daily.  Already on aspirin and Eliquis   Permanent atrial fibrillation Rate controlled heart rates in the 60s and 70s. Continue with Toprol-XL 100 mg daily.  PTA on Eliquis 2.5 mg. On  IV heparin. Transition back to eliquis post cath   Hypoglycemia  Type 2 diabetes In setting of recent GI blood and not associated.  Management per primary team.   For questions or updates, please contact [facility] Please consult [email or web address] for contact info under        Signed, Charlton Haws, MD

## 2024-02-03 ENCOUNTER — Other Ambulatory Visit (HOSPITAL_COMMUNITY): Payer: Self-pay

## 2024-02-03 DIAGNOSIS — Z955 Presence of coronary angioplasty implant and graft: Secondary | ICD-10-CM | POA: Diagnosis not present

## 2024-02-03 DIAGNOSIS — I2510 Atherosclerotic heart disease of native coronary artery without angina pectoris: Secondary | ICD-10-CM | POA: Diagnosis not present

## 2024-02-03 DIAGNOSIS — I350 Nonrheumatic aortic (valve) stenosis: Secondary | ICD-10-CM | POA: Diagnosis not present

## 2024-02-03 DIAGNOSIS — I5022 Chronic systolic (congestive) heart failure: Secondary | ICD-10-CM | POA: Diagnosis not present

## 2024-02-03 DIAGNOSIS — Z9889 Other specified postprocedural states: Secondary | ICD-10-CM | POA: Diagnosis not present

## 2024-02-03 DIAGNOSIS — E1122 Type 2 diabetes mellitus with diabetic chronic kidney disease: Secondary | ICD-10-CM

## 2024-02-03 DIAGNOSIS — E11649 Type 2 diabetes mellitus with hypoglycemia without coma: Secondary | ICD-10-CM | POA: Diagnosis not present

## 2024-02-03 DIAGNOSIS — N184 Chronic kidney disease, stage 4 (severe): Secondary | ICD-10-CM | POA: Diagnosis not present

## 2024-02-03 DIAGNOSIS — I4821 Permanent atrial fibrillation: Secondary | ICD-10-CM | POA: Diagnosis not present

## 2024-02-03 LAB — CBC WITH DIFFERENTIAL/PLATELET
Abs Immature Granulocytes: 0.01 10*3/uL (ref 0.00–0.07)
Basophils Absolute: 0 10*3/uL (ref 0.0–0.1)
Basophils Relative: 0 %
Eosinophils Absolute: 0.1 10*3/uL (ref 0.0–0.5)
Eosinophils Relative: 2 %
HCT: 31.2 % — ABNORMAL LOW (ref 39.0–52.0)
Hemoglobin: 9.9 g/dL — ABNORMAL LOW (ref 13.0–17.0)
Immature Granulocytes: 0 %
Lymphocytes Relative: 25 %
Lymphs Abs: 1.3 10*3/uL (ref 0.7–4.0)
MCH: 31.3 pg (ref 26.0–34.0)
MCHC: 31.7 g/dL (ref 30.0–36.0)
MCV: 98.7 fL (ref 80.0–100.0)
Monocytes Absolute: 0.5 10*3/uL (ref 0.1–1.0)
Monocytes Relative: 9 %
Neutro Abs: 3.2 10*3/uL (ref 1.7–7.7)
Neutrophils Relative %: 64 %
Platelets: 122 10*3/uL — ABNORMAL LOW (ref 150–400)
RBC: 3.16 MIL/uL — ABNORMAL LOW (ref 4.22–5.81)
RDW: 16.3 % — ABNORMAL HIGH (ref 11.5–15.5)
WBC: 5.1 10*3/uL (ref 4.0–10.5)
nRBC: 0 % (ref 0.0–0.2)

## 2024-02-03 LAB — COMPREHENSIVE METABOLIC PANEL
ALT: 22 U/L (ref 0–44)
AST: 31 U/L (ref 15–41)
Albumin: 3 g/dL — ABNORMAL LOW (ref 3.5–5.0)
Alkaline Phosphatase: 72 U/L (ref 38–126)
Anion gap: 8 (ref 5–15)
BUN: 34 mg/dL — ABNORMAL HIGH (ref 8–23)
CO2: 26 mmol/L (ref 22–32)
Calcium: 8.8 mg/dL — ABNORMAL LOW (ref 8.9–10.3)
Chloride: 102 mmol/L (ref 98–111)
Creatinine, Ser: 2.62 mg/dL — ABNORMAL HIGH (ref 0.61–1.24)
GFR, Estimated: 23 mL/min — ABNORMAL LOW (ref >60)
Glucose, Bld: 133 mg/dL — ABNORMAL HIGH (ref 70–99)
Potassium: 4.5 mmol/L (ref 3.5–5.1)
Sodium: 136 mmol/L (ref 135–145)
Total Bilirubin: 1.2 mg/dL (ref 0.0–1.2)
Total Protein: 6.4 g/dL — ABNORMAL LOW (ref 6.5–8.1)

## 2024-02-03 LAB — PHOSPHORUS: Phosphorus: 2.7 mg/dL (ref 2.5–4.6)

## 2024-02-03 LAB — GLUCOSE, CAPILLARY
Glucose-Capillary: 134 mg/dL — ABNORMAL HIGH (ref 70–99)
Glucose-Capillary: 152 mg/dL — ABNORMAL HIGH (ref 70–99)

## 2024-02-03 LAB — MAGNESIUM: Magnesium: 2.2 mg/dL (ref 1.7–2.4)

## 2024-02-03 MED ORDER — TICAGRELOR 90 MG PO TABS: 90.0000 mg | ORAL_TABLET | Freq: Two times a day (BID) | ORAL | 6 refills | Status: DC

## 2024-02-03 MED ORDER — METOPROLOL SUCCINATE ER 100 MG PO TB24: 100.0000 mg | ORAL_TABLET | Freq: Every day | ORAL | 0 refills | Status: DC

## 2024-02-03 MED ORDER — ACETAMINOPHEN 325 MG PO TABS: 650.0000 mg | ORAL_TABLET | Freq: Four times a day (QID) | ORAL | 0 refills | Status: DC | PRN

## 2024-02-03 MED ORDER — TICAGRELOR 90 MG PO TABS
180.0000 mg | ORAL_TABLET | Freq: Once | ORAL | Status: AC
  Administered 2024-02-03: 180 mg via ORAL
  Filled 2024-02-03: qty 2

## 2024-02-03 MED ORDER — ASPIRIN 81 MG PO TABS: 81.0000 mg | ORAL_TABLET | Freq: Every morning | ORAL | 0 refills | Status: DC

## 2024-02-03 MED ORDER — CLOPIDOGREL BISULFATE 75 MG PO TABS: 75.0000 mg | ORAL_TABLET | Freq: Every day | ORAL | 0 refills | Status: DC

## 2024-02-03 MED ORDER — CLOPIDOGREL BISULFATE 75 MG PO TABS: 75.0000 mg | ORAL_TABLET | Freq: Every day | ORAL | Status: DC

## 2024-02-03 NOTE — Plan of Care (Signed)
   Problem: Education: Goal: Ability to describe self-care measures that may prevent or decrease complications (Diabetes Survival Skills Education) will improve Outcome: Progressing

## 2024-02-03 NOTE — Progress Notes (Addendum)
 Patient Name: [name] Date of Encounter: 02/03/2024 [facility] Cardiologist: Jeremiah Batty, MD   Interval Summary  .    No events overnight. Patient denies anginal chest pain or heart failure symptoms. Son at bedside. Patient wishes to be discharged home  Vital Signs .    Vitals:   02/02/24 2305 02/03/24 0014 02/03/24 0437 02/03/24 0742  BP: (!) 150/70 (!) 145/63 126/69 (!) 149/97  Pulse: 67 67 62   Resp: 14 15 13    Temp: 97.6 F (36.4 C)  97.7 F (36.5 C) 97.7 F (36.5 C)  TempSrc: Oral  Oral Oral  SpO2: 98% 97% 97% 93%  Weight:      Height:        Intake/Output Summary (Last 24 hours) at 02/03/2024 0933 Last data filed at 02/03/2024 0743 Gross per 24 hour  Intake 1004.47 ml  Output 1250 ml  Net -245.53 ml      02/01/2024   12:00 AM 01/31/2024    9:34 AM 01/23/2024   11:42 AM  Last 3 Weights  Weight (lbs) 159 lb 13.3 oz 154 lb 9.6 oz --  Weight (kg) 72.5 kg 70.126 kg --      Telemetry/ECG    Afib rate controlled - Personally Reviewed  CV Studies    Echocardiogram 12/13/2023  1. Left ventricular ejection fraction, by estimation, is 30 to 35%. The  left ventricle has moderately decreased function. The left ventricle  demonstrates global hypokinesis. There is moderate concentric left  ventricular hypertrophy. Left ventricular  diastolic function could not be evaluated.   2. Right ventricular systolic function is moderately reduced. The right  ventricular size is normal. There is moderately elevated pulmonary artery  systolic pressure. The estimated right ventricular systolic pressure is  54.7 mmHg.   3. The mitral valve has been repaired/replaced. There is a single  MitraClip NTW placed on A2/P2 Mitra-Clip present in the mitral position.  Procedure Date: 02/06/2020. The mitra Clip is well seated. The mean MVG is  consistent with mild mitral  stenosis. Mild mitral regurgitation is present.   4. The aortic valve is calcified. There  is severe calcifcation of the  aortic valve. There is severe thickening of the aortic valve. Aortic valve  regurgitation is mild. Aortic valve mean gradient measures 12.0 mmHg.  Aortic valve Vmax measures 2.42 m/s.  low DVI 0.22 and SVI 20. Despite a low AVG and Vmax, all findings suggest  at least moderate and likely moderate to severe low flow low gradient  aortic stenosis in setting of HFrEF.   5. The inferior vena cava is dilated in size with <50% respiratory  variability, suggesting right atrial pressure of 15 mmHg.   6. Ascending aorta measurements are within normal limits for age when  indexed to body surface area.   7. Evidence of residual atrial level shunting detected by color flow  Doppler from prior transseptal puncture site .   8. Compared to prior study dated 11/24/22, the mean AVG has increased from  to , Vmax has icnreased from 1.51m/s to 2.24m/s, DVI has  decreased from 0.34 to 0.22. Now at least moderate and likely moderate to  severe low flow low gradient Aortic  stenosis. Stable Mitra-Clip with minimal increase in mean MVG from 4 to  .    Physical Exam .   GEN: No acute distress.   Neck: no JVD Cardiac: RRR, soft systolic ejection murmur second right intercostal space, no rubs, or gallops.  Respiratory:  Clear to auscultation bilaterally, no wheezes rales or rhonchi's. GI: Soft, nontender, non-distended  MS: Trace pitting edema, L>R  Patient Profile    [name] is a 89 y.o. male has hx of  CAD s/p multiple interventions (CABG in 1996, redo CABG in 2006, and PCI with DES to SVG-OM graft in 05/2014), Plavix non-responder, chronic HFrEF with EF of 30-35% in 11/2023, permanent atrial fibrillation on Eliquis, mitral regurgitation s/p MitraClip in 01/2020 with mild residual regurgitation, severe low flow low gradient aortic stenosis (currently being worked up for TAVR), bilateral carotid stenosis s/p right CEA in 1994, hypertension, hyperlipidemia, CKD  stage IV, obstructive sleep apnea, and GI bleeding s/p left sided hemicolectomy and sigmoidectomy.  Cardiology initially saw for concerns of CHF exacerbation, admitted for persistent hypoglycemia.  Assessment & Plan .     Chronic HFrEF  11/2023 EF 30 to 35% with moderately reduced RV function. Secondary to ischemic cardiomyopathy Medication titration is difficult given his chronic kidney disease stage IV Stage C, NYHA class II Continue hydralazine 25 mg p.o. every afternoon. Continue Imdur 90 mg p.o. daily. Continue Toprol-XL 100 mg p.o. daily. Continue ranolazine. Continue Crestor 10 mg p.o. every morning. Continue torsemide 40 mg every morning and 20 mg p.o. every afternoon Of note based on EMR Jardiance have been discontinued in the past due to hypoglycemia Holding off ACE inhibitor/ARB/Arni/MRA/SGLT2 inhibitors due to chronic kidney disease stage IV and recent contrast exposure.  Depending on his clinical trajectory and renal function may consider low-dose ARB as outpatient with close monitoring.  Aortic valve disease. Aortic valve stenosis, hemodynamic suggestive of severe low-flow low gradient AS Severe calcification of the aortic valve, mean gradient 12 mmHg, peak velocity 2.4 m/s, dimensional index 0.22, stroke-volume index 20. Please see the cath report on 02/02/2024 for now recommending conservative management. Patient is aware to be apprehensive of symptoms of anginal chest pain, heart failure, or syncope.     MVR status post mitral clip 2021 Mild residual MR.  Stable.  Continue to follow  CAD status post CABG  Extensive hx CABG in 1996, redo CABG in 2006, and PCI with DES to SVG-OM graft in 2015.   Now s/p DES SVG to Diag on 02/02/2024 Per interventional cardiology recommendations: Continue Eliquis 2.5 mg p.o. twice daily starting 02/03/2024. Continue aspirin 81 mg p.o. daily for the first 30 days.  Continue Plavix 75 mg p.o. daily for the first 6 months Continue Ranexa  500 mg p.o. twice daily. Continue Imdur 90 mg p.o. daily. Continue Toprol-XL. Continue Crestor  Permanent atrial fibrillation Rate controlled heart rates in the 60s and 70s. Continue with Toprol-XL 100 mg daily.  PTA on Eliquis 2.5 mg. Transition back to Eliquis today 02/03/2024 Does not endorse evidence of bleeding. Risks, benefits, and alternatives discussed with the patient.  Hypoglycemia  Type 2 diabetes Management per primary team  Plan of care discussed with the patient and son who was present at bedside.  Outpatient follow up is already arranged.   For questions or updates, please contact [facility] Please consult [email or web address] for contact info under   Tessa Lerner, DO, [facility]  [address] [place], [place] [address] 9:33 AM 02/03/24  Addendum:  Inpatient pharmacy staff reached out conveying concerns of the patient is a nonresponder to Plavix.  He had a P2 Y12 assay in August 2015 which was 266.  Spoke to interventional cardiologist on-call to discuss his case and antiplatelet/anticoagulation regimen to facilitate safe  discharge.  Shared decision was to transition from Plavix to Brilinta.  Discontinue aspirin.  And continue Eliquis for renal dosing for A-fib. Patient will be given Brilinta loading dose prior to discharge and will start maintenance dose 90 mg p.o. twice daily starting tomorrow 02/04/2024.  Prescription has been sent to his outpatient pharmacy and patient is aware that the cost is $43. Discontinue aspirin 81 mg p.o. daily. Continue Eliquis 2.5 mg p.o. twice daily prescription is also sent to the outpatient pharmacy.   Plan of care discussed with inpatient pharmacist and also attending physician all parties are aware of the recommendations and questions and concerns were addressed. Will update Dr. Copper of the changes as well.   Lamin Chandley, DO, [facility]  3:22 PM

## 2024-02-03 NOTE — Discharge Summary (Signed)
 Physician Discharge Summary   Patient: [name] MRN: [id number] DOB: August 05, 1932  Admit date:     01/31/2024  Discharge date: {dischdate:26783}  Discharge Physician: Merlene Laughter   PCP: Joaquim Nam, MD   Recommendations at discharge:  {Tip this will not be part of the note when signed- Example include specific recommendations for outpatient follow-up, pending tests to follow-up on. (Optional):26781}  ***  Discharge Diagnoses: Principal Problem:   Hypoglycemia associated with diabetes ([name]) Active Problems:   Permanent atrial fibrillation ([name])   CKD (chronic kidney disease) stage 4, GFR 15-29 ml/min ([name])   HTN (hypertension)   HLD (hyperlipidemia)   Sleep apnea   H/O mitral valve repair   Chronic systolic CHF (congestive heart failure) ([name])   DNR (do not resuscitate)   Diastolic congestive heart failure ([name])   Chronic atrial fibrillation ([name])   Hypoglycemia   S/P coronary artery stent placement   Atherosclerosis of native coronary artery of native heart without angina pectoris  Resolved Problems:   * No resolved hospital problems. *  Hospital Course: 89 y.o. male with medical history significant of macular degeneration, afib, CAD, stage 4 CKD, chronic systolic CHF, HTN, HLD, DM, and OSA on CPAP who presented on 3/19 with hypoglycemia.  Patient apparently took more insulin than he should have for unclear reasons.  There was also concern for congestive heart failure exacerbation from his aortic stenosis.  Patient was subsequently hospitalized for further management.   Patient underwent cardiac catheterization is planned for a workup for his TAVR and and is status post phenous vein graft intervention on a background of permanent atrial fibrillation.  Cardiology recommends triple therapy for now and stopping aspirin after a month and continuing with clopidogrel for 6 months.  They are recommending resuming anticoagulation tomorrow morning.  They will discuss his  case at the disciplinary heart valve team and feel that the patient be discharged tomorrow along his renal function remains normal.  Assessment and Plan:  Hypoglycemia: Apparently took more insulin than he should have.  He also had recent GI illness as a result of which she had poor oral intake.  I think both these issues caused a significant drop in his glucose levels. Lantus was held. CBGs have stabilized.  Will change to before meals and at bedtime checks continue to monitor CBGs.  Continue to hold insulin.  Check HbA1c was 6.4.  Last HbA1c was 7.0 in December.  Continue monitor CBGs per protocol ranging from 94-142   Chronic Systolic CHF/Severe aortic stenosis/coronary artery disease s/p /mitral regurgitation: Followed by Cardiology.  ECHOCardiogram from January showed EF of 30 to 35%. Seen by cardiology during this admission.  Underwent Cardiac Cath as TAVR workup for aortic stenosis.  Cath was done and he had a stent placed and is now on antiplatelets. Cardiology does not feel that he needs a TAVR given that he has moderate aortic stenosis. Cardiac status otherwise noted to be stable. He is noted to be on aspirin, hydralazine, isosorbide mononitrate, metoprolol, Ranexa, rosuvastatin and torsemide.He is status post MitraClip in 2021.  Other care per cardiology   History of Atrial Fibrillation: Continue metoprolol.  Eliquis currently on hold for cardiac cath which was done 3/21.  Per cardiology they will resume anticoagulation tomorrow morning   Essential Hypertension: Monitor blood pressures closely. Last BP reading was 107/58   Normocytic Anemia: Hemoglobin has been on the lower side but stable for the most part. Hgb/Hct is now 9.9/29.0. Check Anemia Panel in the  AM. CTM for S/Sx of Bleeding   CKD Stage IV -Baseline creatinine is between 2.7-3.0.  Creatinine close to baseline.  -BUN/Cr Trend: Recent Labs  Lab 01/09/24 1529 01/31/24 0950 02/01/24 0231 02/02/24 0802  BUN 47* 45* 38* 34*   CREATININE 2.92* 2.89* 2.77* 2.62*  -Apparently had been taking ibuprofen at home and he was told not to ever take this medicine or any other NSAIDs. -Avoid Nephrotoxic Medications, Contrast Dyes, Hypotension and Dehydration to Ensure Adequate Renal Perfusion and will need to Renally Adjust Meds. CTM and Trend Renal Function carefully and repeat CMP in the AM   Obstructive Sleep Apnea: CPAP  Hypoalbuminemia: Patient's Albumin Trend: Recent Labs  Lab 01/31/24 0950 02/02/24 0802  ALBUMIN 3.1* 3.0*  -Continue to Monitor and Trend and repeat CMP in the AM  Overweight: Complicates overall prognosis and care. Estimated body mass index is 25.03 kg/m as calculated from the following:   Height as of this encounter: 5\' 7"  (1.702 m).   Weight as of this encounter: 72.5 kg. Weight Loss and Dietary Counseling given  Assessment and Plan: No notes have been filed under this hospital service. Service: Hospitalist     {Tip this will not be part of the note when signed Body mass index is 25.03 kg/m. , ,  (Optional):26781}  {(NOTE) Pain control PDMP Statment (Optional):26782} Consultants: *** Procedures performed: ***  Disposition: {Plan; Disposition:26390} Diet recommendation:  {Diet_Plan:26776} DISCHARGE MEDICATION: Allergies as of 02/03/2024       Reactions   Nsaids    CKD        Medication List     STOP taking these medications    aspirin 81 MG tablet   BD Insulin Syringe U/F 31G X 5/16" 0.3 ML Misc Generic drug: Insulin Syringe-Needle U-100   empagliflozin 10 MG Tabs tablet Commonly known as: Jardiance   ibuprofen 200 MG tablet Commonly known as: ADVIL   Lantus 100 UNIT/ML injection Generic drug: insulin glargine   metoprolol tartrate 50 MG tablet Commonly known as: LOPRESSOR       TAKE these medications    acetaminophen 325 MG tablet Commonly known as: TYLENOL Take 2 tablets (650 mg total) by mouth every 6 (six) hours as needed for mild pain (pain score  1-3) (or Fever >/= 101).   allopurinol 100 MG tablet Commonly known as: ZYLOPRIM TAKE ONE-HALF (1/2) TABLET EVERY MONDAY, WEDNESDAY AND FRIDAY What changed: See the new instructions.   Eliquis 2.5 MG Tabs tablet Generic drug: apixaban TAKE 1 TABLET TWICE A DAY   fish oil-omega-3 fatty acids 1000 MG capsule Take 1 g by mouth at bedtime.   FREESTYLE LITE test strip Generic drug: glucose blood USE TO TEST BLOOD SUGAR ONCE DAILY AND AS NEEDED   hydrALAZINE 25 MG tablet Commonly known as: APRESOLINE Take 1 tablet (25 mg total) by mouth daily. What changed: when to take this   HYDROcodone-acetaminophen 5-325 MG tablet Commonly known as: NORCO/VICODIN Take 0.5-1 tablets by mouth 2 (two) times daily as needed for moderate pain (pain score 4-6).   ipratropium 0.03 % nasal spray Commonly known as: ATROVENT USE 2 SPRAYS IN EACH NOSTRIL TWICE A DAY AS NEEDED FOR RHINITIS What changed: See the new instructions.   isosorbide mononitrate 60 MG 24 hr tablet Commonly known as: IMDUR Take 1.5 tablets (90 mg total) by mouth daily. What changed: when to take this   magnesium oxide 400 MG tablet Commonly known as: MAG-OX Take 1 tablet (400 mg total) by mouth every other day.  What changed:  when to take this additional instructions   metoprolol succinate 100 MG 24 hr tablet Commonly known as: TOPROL-XL Take 1 tablet (100 mg total) by mouth daily. Take with or immediately following a meal. Start taking on: February 04, 2024   nitroGLYCERIN 0.4 MG SL tablet Commonly known as: NITROSTAT DISSOLVE 1 TABLET UNDER THE TONGUE EVERY 5 MINUTES AS NEEDED FOR CHEST PAIN FOR A MAXIMUM OF 3 DOSES What changed: See the new instructions.   pantoprazole 40 MG tablet Commonly known as: PROTONIX Take 1 tablet (40 mg total) by mouth 2 (two) times daily for 42 days, THEN 1 tablet (40 mg total) daily. Start taking on: August 25, 2023 What changed: See the new instructions.   polyethylene glycol 17 g  packet Commonly known as: MIRALAX / GLYCOLAX Take 17 g by mouth daily.   Potassium Gluconate 595 MG Caps Take 595 mg by mouth in the morning.   ranolazine 500 MG 12 hr tablet Commonly known as: RANEXA TAKE 1 TABLET TWICE A DAY   rosuvastatin 10 MG tablet Commonly known as: CRESTOR Take 1 tablet (10 mg total) by mouth daily. What changed: when to take this   ticagrelor 90 MG Tabs tablet Commonly known as: Brilinta Take 1 tablet (90 mg total) by mouth 2 (two) times daily.   torsemide 20 MG tablet Commonly known as: DEMADEX TAKE 2 TABLETS IN THE MORNING AND 1 TABLET IN THE EVENING What changed: See the new instructions.   Vitamin D 1000 units capsule Take 1,000 Units by mouth at bedtime.        Follow-up Information     [place], [facility] Follow up.   Why: [facility] should be in contact with you within 48 hours of discharge to arrange a home health visit Contact information: [address] [place] [address] [phone]         Tonny Bollman, MD Follow up.   Specialty: Cardiology Why: Cardiology Hospital Follow-up on 02/23/2024 at 9:20 AM. Contact information: [address] [place] [address] [phone]                Discharge Exam: Ceasar Mons Weights   01/31/24 0934 02/01/24 0000  Weight: 70.1 kg 72.5 kg   ***  Condition at discharge: {DC Condition:26389}  The results of significant diagnostics from this hospitalization (including imaging, microbiology, ancillary and laboratory) are listed below for reference.   Imaging Studies: CARDIAC CATHETERIZATION Result Date: 02/02/2024 1.  Severe native vessel coronary artery disease with total occlusion of the proximal LAD, total occlusion of the ostial RCA, and stable severe stenosis of the circumflex 2.  Status post CABG with continued patency of the LIMA to LAD and patency of the SVG to diagonal with type stenosis in the proximal body of the graft at  the edge of the previously stented segment, treated with a 3.5 x 12 mm Synergy DES 3.  Normal left heart diastolic filling pressures with a pulmonary wedge pressure of 18 and an LVEDP of 15 mmHg 4.  Moderately elevated right heart pressures with a mean right atrial pressure of 12 mmHg and a pulmonary artery pressure of 55/17 mean 31 mmHg, transpulmonary gradient 13 mmHg, PVR 2.4 Wood units. 5.  Mild aortic stenosis by direct catheter measurement with a mean transvalvular gradient of 6 mmHg and calculated valve area of 2.3 cm Recommendations: Patient now status post saphenous vein graft intervention on a background of permanent atrial fibrillation.  Recommend  that he resume apixaban 2.5 mg twice daily tomorrow morning.  Recommend aspirin 81 mg x 30 days then stop.  Recommend clopidogrel 75 mg daily x 6 months as tolerated.  I suspect the patient has moderate aortic stenosis and I would favor ongoing observation and medical therapy considering his advanced age and significant comorbidities.  I am not convinced that proceeding with TAVR would benefit him and I think it would expose him to significant risk of renal failure as he would still require CT angiography studies and the TAVR procedure.  Will review his case with our multidisciplinary heart valve team, but he can be discharged tomorrow from the hospital as long as his kidney function is stable.  He received 35 cc of contrast for today's procedure.  DG Chest Port 1 View Result Date: 01/31/2024 CLINICAL DATA:  Hypoglycemia. EXAM: PORTABLE CHEST 1 VIEW COMPARISON:  08/18/2023 FINDINGS: Decreased inspiration with grossly stable enlargement of the cardiac silhouette. Post CABG changes and aortic calcifications are again demonstrated. No significant change in mild-to-moderate diffuse peribronchial thickening and accentuation of the interstitial markings without airspace consolidation or pleural fluid. Gas distended gastric fundus. Right neck surgical clips.  Moderate left glenohumeral joint degenerative changes. IMPRESSION: 1. No acute abnormality. 2. No significant change in mild-to-moderate chronic bronchitic changes. 3. Stable cardiomegaly. Electronically Signed   By: Beckie Salts M.D.   On: 01/31/2024 13:46    Microbiology: Results for orders placed or performed during the hospital encounter of 01/31/24  MRSA Next Gen by PCR, Nasal     Status: None   Collection Time: 01/31/24  8:37 PM   Specimen: Nasal Mucosa; Nasal Swab  Result Value Ref Range Status   MRSA by PCR Next Gen NOT DETECTED NOT DETECTED Final    Comment: (NOTE) The GeneXpert MRSA Assay (FDA approved for NASAL specimens only), is one component of a comprehensive MRSA colonization surveillance program. It is not intended to diagnose MRSA infection nor to guide or monitor treatment for MRSA infections. Test performance is not FDA approved in patients less than 64 years old. Performed at [facility], [address] [place], [place] [address]     Labs: CBC: Recent Labs  Lab 01/31/24 0950 02/01/24 0231 02/02/24 0802 02/02/24 1031 02/02/24 1032 02/03/24 0209  WBC 5.6 5.3 4.9  --   --  5.1  NEUTROABS 4.1  --  2.7  --   --  3.2  HGB 10.2* 9.7* 9.9* 9.9* 9.9* 9.9*  HCT 33.0* 31.3* 32.0* 29.0* 29.0* 31.2*  MCV 101.2* 97.5 97.9  --   --  98.7  PLT 140* 129* 121*  --   --  122*   Basic Metabolic Panel: Recent Labs  Lab 01/31/24 0950 02/01/24 0231 02/02/24 0802 02/02/24 1031 02/02/24 1032 02/03/24 0209  NA 137 137 138 138 138 136  K 3.7 3.9 4.0 3.8 3.8 4.5  CL 100 102 101  --   --  102  CO2 22 26 28   --   --  26  GLUCOSE 51* 98 127*  --   --  133*  BUN 45* 38* 34*  --   --  34*  CREATININE 2.89* 2.77* 2.62*  --   --  2.62*  CALCIUM 8.5* 8.4* 8.6*  --   --  8.8*  MG  --   --  2.2  --   --  2.2  PHOS  --   --  2.8  --   --  2.7   Liver  Function Tests: Recent Labs  Lab 01/31/24 0950 02/02/24 0802 02/03/24 0209  AST 50* 37 31  ALT 21 23 22   ALKPHOS  69 73 72  BILITOT 1.8* 1.4* 1.2  PROT 6.7 6.3* 6.4*  ALBUMIN 3.1* 3.0* 3.0*   CBG: Recent Labs  Lab 02/02/24 1157 02/02/24 1611 02/02/24 2113 02/03/24 0618 02/03/24 1128  GLUCAP 94 119* 157* 134* 152*    Discharge time spent: {LESS THAN/GREATER THAN:26388} 30 minutes.  Signed: Merlene Laughter, DO [facility] 02/03/2024

## 2024-02-03 NOTE — Progress Notes (Addendum)
 Physical Therapy Treatment Patient Details Name: [name] MRN: [id number] DOB: Apr 06, 1932 Today's Date: 02/03/2024   History of Present Illness Pt is 89 year old presented to [facility] on  01/31/24 for hypoglycemia due to taking too much insulin. 3/21 R/L heart cath and coronary angiography. Plan for TAVR. PMH - macular degeneration, afib, CAD, CKD, CHF, HTN, DM, OSA, aortic stenosis, CABG    PT Comments  Pt in bed upon arrival with son present and agreeable to PT session. Pt was ModI for bed mobility in today's session and was able to perform multiple stands with RW and supervision for safety. Pt was able to ambulate ~43ft with RW and supervision. Gait distance limited by fatigue. Pt feels comfortable discharging home and has no questions or concerns. D/C recs remain appropriate with HHPT to work towards independence with mobility. Pt is progressing well towards goals. Acute PT to follow.   5 Meter Walk Test 1- 15.28 2- 18.7 3- 19.11 Average- 17.7   HR 80-93 BPM with activity   If plan is discharge home, recommend the following: Assistance with cooking/housework;Assist for transportation;Help with stairs or ramp for entrance   Can travel by private vehicle      Yes  Equipment Recommendations  None recommended by PT       Precautions / Restrictions Precautions Precautions: Fall Restrictions Weight Bearing Restrictions Per Provider Order: No     Mobility  Bed Mobility Overal bed mobility: Modified Independent     Transfers Overall transfer level: Needs assistance Equipment used: Rolling walker (2 wheels) Transfers: Sit to/from Stand Sit to Stand: Supervision    General transfer comment: supervision for safety, cues with hand placement    Ambulation/Gait Ambulation/Gait assistance: Contact guard assist Gait Distance (Feet): 50 Feet Assistive device: Rolling walker (2 wheels) Gait Pattern/deviations: Step-through pattern, Decreased stride length Gait velocity: decr      General Gait Details: steady with no LOB     Balance Overall balance assessment: Needs assistance Sitting-balance support: No upper extremity supported, Feet supported Sitting balance-Leahy Scale: Good     Standing balance support: Bilateral upper extremity supported, During functional activity, Reliant on assistive device for balance Standing balance-Leahy Scale: Poor Standing balance comment: reliant on RW       Communication Communication Communication: Impaired Factors Affecting Communication: Hearing impaired  Cognition Arousal: Alert Behavior During Therapy: WFL for tasks assessed/performed   PT - Cognitive impairments: No apparent impairments     Following commands: Intact      Cueing Cueing Techniques: Verbal cues  Exercises General Exercises - Lower Extremity Hip Flexion/Marching: AROM, Both, 10 reps, Seated Other Exercises Other Exercises: x5 serial STS with RW        Pertinent Vitals/Pain Pain Assessment Pain Assessment: Faces Faces Pain Scale: Hurts a little bit Pain Location: R shoulder when pushing Pain Descriptors / Indicators: Discomfort Pain Intervention(s): Limited activity within patient's tolerance, Monitored during session, Repositioned     PT Goals (current goals can now be found in the care plan section) Acute Rehab PT Goals PT Goal Formulation: With patient Time For Goal Achievement: 02/15/24 Potential to Achieve Goals: Good Progress towards PT goals: Progressing toward goals    Frequency    Min 2X/week       AM-PAC PT "6 Clicks" Mobility   Outcome Measure  Help needed turning from your back to your side while in a flat bed without using bedrails?: None Help needed moving from lying on your back to sitting on the side of a  flat bed without using bedrails?: None Help needed moving to and from a bed to a chair (including a wheelchair)?: A Little Help needed standing up from a chair using your arms (e.g., wheelchair or  bedside chair)?: A Little Help needed to walk in hospital room?: A Little Help needed climbing 3-5 steps with a railing? : A Lot 6 Click Score: 19    End of Session Equipment Utilized During Treatment: Gait belt Activity Tolerance: Patient tolerated treatment well Patient left: in bed;with call bell/phone within reach;with family/visitor present Nurse Communication: Mobility status PT Visit Diagnosis: Other abnormalities of gait and mobility (R26.89);Muscle weakness (generalized) (M62.81)     Time: 1610-9604 PT Time Calculation (min) (ACUTE ONLY): 24 min  Charges:    $Gait Training: 8-22 mins $Therapeutic Exercise: 8-22 mins PT General Charges $$ ACUTE PT VISIT: 1 Visit                     Hilton Cork, PT, DPT Secure Chat Preferred  Rehab Office [phone]   Arturo Morton Brion Aliment 02/03/2024, 10:46 AM

## 2024-02-03 NOTE — TOC Transition Note (Signed)
 Transition of Care [facility]) - Discharge Note   Patient Details  Name: [name] MRN: [id number] Date of Birth: 1932-09-29  Transition of Care [facility]) CM/SW Contact:  Ronny Bacon, RN Phone Number: 02/03/2024, 1:44 PM   Clinical Narrative:   Patient is being discharged today. Heather with [facility] made aware.           Patient Goals and CMS Choice            Discharge Placement                       Discharge Plan and Services Additional resources added to the After Visit Summary for                                       Social Drivers of Health (SDOH) Interventions SDOH Screenings   Food Insecurity: No Food Insecurity (01/31/2024)  Housing: Low Risk  (01/31/2024)  Transportation Needs: No Transportation Needs (01/31/2024)  Utilities: Not At Risk (01/31/2024)  Alcohol Screen: Low Risk  (10/31/2023)  Depression (PHQ2-9): Low Risk  (01/23/2024)  Recent Concern: Depression (PHQ2-9) - Medium Risk (01/09/2024)  Financial Resource Strain: Low Risk  (10/31/2023)  Physical Activity: Inactive (10/31/2023)  Social Connections: Moderately Isolated (01/31/2024)  Stress: No Stress Concern Present (10/31/2023)  Tobacco Use: Medium Risk (01/31/2024)  Health Literacy: Adequate Health Literacy (10/31/2023)     Readmission Risk Interventions     No data to display

## 2024-02-04 LAB — LIPOPROTEIN A (LPA): Lipoprotein (a): 194.1 nmol/L — ABNORMAL HIGH (ref <75.0)

## 2024-02-05 ENCOUNTER — Telehealth: Payer: Self-pay

## 2024-02-05 ENCOUNTER — Other Ambulatory Visit: Payer: Self-pay

## 2024-02-05 DIAGNOSIS — M109 Gout, unspecified: Secondary | ICD-10-CM | POA: Diagnosis not present

## 2024-02-05 DIAGNOSIS — G4730 Sleep apnea, unspecified: Secondary | ICD-10-CM | POA: Diagnosis not present

## 2024-02-05 DIAGNOSIS — K219 Gastro-esophageal reflux disease without esophagitis: Secondary | ICD-10-CM | POA: Diagnosis not present

## 2024-02-05 DIAGNOSIS — Z66 Do not resuscitate: Secondary | ICD-10-CM | POA: Diagnosis not present

## 2024-02-05 DIAGNOSIS — Z6834 Body mass index (BMI) 34.0-34.9, adult: Secondary | ICD-10-CM | POA: Diagnosis not present

## 2024-02-05 DIAGNOSIS — N184 Chronic kidney disease, stage 4 (severe): Secondary | ICD-10-CM | POA: Diagnosis not present

## 2024-02-05 DIAGNOSIS — E11649 Type 2 diabetes mellitus with hypoglycemia without coma: Secondary | ICD-10-CM | POA: Diagnosis not present

## 2024-02-05 DIAGNOSIS — E663 Overweight: Secondary | ICD-10-CM | POA: Diagnosis not present

## 2024-02-05 DIAGNOSIS — I4892 Unspecified atrial flutter: Secondary | ICD-10-CM | POA: Diagnosis not present

## 2024-02-05 DIAGNOSIS — I340 Nonrheumatic mitral (valve) insufficiency: Secondary | ICD-10-CM | POA: Diagnosis not present

## 2024-02-05 DIAGNOSIS — D631 Anemia in chronic kidney disease: Secondary | ICD-10-CM | POA: Diagnosis not present

## 2024-02-05 DIAGNOSIS — I350 Nonrheumatic aortic (valve) stenosis: Secondary | ICD-10-CM | POA: Diagnosis not present

## 2024-02-05 DIAGNOSIS — I6529 Occlusion and stenosis of unspecified carotid artery: Secondary | ICD-10-CM | POA: Diagnosis not present

## 2024-02-05 DIAGNOSIS — E8809 Other disorders of plasma-protein metabolism, not elsewhere classified: Secondary | ICD-10-CM | POA: Diagnosis not present

## 2024-02-05 DIAGNOSIS — I272 Pulmonary hypertension, unspecified: Secondary | ICD-10-CM | POA: Diagnosis not present

## 2024-02-05 DIAGNOSIS — I4821 Permanent atrial fibrillation: Secondary | ICD-10-CM | POA: Diagnosis not present

## 2024-02-05 DIAGNOSIS — I4510 Unspecified right bundle-branch block: Secondary | ICD-10-CM | POA: Diagnosis not present

## 2024-02-05 DIAGNOSIS — I5023 Acute on chronic systolic (congestive) heart failure: Secondary | ICD-10-CM | POA: Diagnosis not present

## 2024-02-05 DIAGNOSIS — I2510 Atherosclerotic heart disease of native coronary artery without angina pectoris: Secondary | ICD-10-CM | POA: Diagnosis not present

## 2024-02-05 DIAGNOSIS — Z789 Other specified health status: Secondary | ICD-10-CM

## 2024-02-05 DIAGNOSIS — I130 Hypertensive heart and chronic kidney disease with heart failure and stage 1 through stage 4 chronic kidney disease, or unspecified chronic kidney disease: Secondary | ICD-10-CM | POA: Diagnosis not present

## 2024-02-05 DIAGNOSIS — E1122 Type 2 diabetes mellitus with diabetic chronic kidney disease: Secondary | ICD-10-CM | POA: Diagnosis not present

## 2024-02-05 DIAGNOSIS — I5030 Unspecified diastolic (congestive) heart failure: Secondary | ICD-10-CM | POA: Diagnosis not present

## 2024-02-05 DIAGNOSIS — E1142 Type 2 diabetes mellitus with diabetic polyneuropathy: Secondary | ICD-10-CM | POA: Diagnosis not present

## 2024-02-05 DIAGNOSIS — E785 Hyperlipidemia, unspecified: Secondary | ICD-10-CM | POA: Diagnosis not present

## 2024-02-05 DIAGNOSIS — H3530 Unspecified macular degeneration: Secondary | ICD-10-CM | POA: Diagnosis not present

## 2024-02-05 NOTE — Telephone Encounter (Signed)
 Orders given to [facility] and orders sent to [facility]

## 2024-02-05 NOTE — Telephone Encounter (Signed)
 Copied from CRM [id number]. Topic: General - Other >> Feb 05, 2024 12:43 PM Rodman Pickle T wrote: Reason for CRM: bryan from home health is requesting home health pt for patient once a week for 8 weeks and requesting a shower chair for patient to be sent to [facility] fax number [phone]   call back number for bryan is [phone]

## 2024-02-05 NOTE — Telephone Encounter (Signed)
 Please give the order.  Thanks.

## 2024-02-05 NOTE — Transitions of Care (Post Inpatient/ED Visit) (Signed)
   02/05/2024  Name: [name] MRN: [id number] DOB: February 19, 1932  Today's TOC FU Call Status: Today's TOC FU Call Status:: Unsuccessful Call (1st Attempt) Unsuccessful Call (1st Attempt) Date: 02/05/24  Attempted to reach the patient regarding the most recent Inpatient/ED visit.  Follow Up Plan: Additional outreach attempts will be made to reach the patient to complete the Transitions of Care (Post Inpatient/ED visit) call.   Deidre Ala, [name], RN   [facility] - [employer] [facility] RN Care Manager [phone]

## 2024-02-06 ENCOUNTER — Telehealth: Payer: Self-pay | Admitting: Cardiovascular Disease

## 2024-02-06 ENCOUNTER — Telehealth: Payer: Self-pay | Admitting: *Deleted

## 2024-02-06 ENCOUNTER — Other Ambulatory Visit: Payer: Self-pay | Admitting: Physician Assistant

## 2024-02-06 MED ORDER — CLOPIDOGREL BISULFATE 75 MG PO TABS: ORAL_TABLET | ORAL | 3 refills | Status: DC

## 2024-02-06 NOTE — Telephone Encounter (Signed)
 Yes he was considered a plavix 'non-responder' in the past so brilinta was used. At his age, this is too much anticoagulation for him. I would recommend switching from brilinta to clopidogrel. He should take clopidogrel 300 mg x 1 then 75 mg daily and should STOP ticagrelor. thanks

## 2024-02-06 NOTE — Telephone Encounter (Signed)
Will forward to Dr. Burt Knack and his nurse.

## 2024-02-06 NOTE — Telephone Encounter (Signed)
 Called [name] back with TOC FU and informed her of Dr. Earmon Phoenix advisement. Called patient back also. Patient agreed to change brilinta to Plavix. Sent in new medication to patient's pharmacy of choice.

## 2024-02-06 NOTE — Transitions of Care (Post Inpatient/ED Visit) (Signed)
 02/06/2024  Name: [name] MRN: [id number] DOB: 1932/01/12  Today's TOC FU Call Status: Today's TOC FU Call Status:: Successful TOC FU Call Completed TOC FU Call Complete Date: 02/06/24 Patient's Name and Date of Birth confirmed.  Transition Care Management Follow-up Telephone Call Date of Discharge: 02/03/23 Discharge Facility: [facility]) Type of Discharge: Inpatient Admission Primary Inpatient Discharge Diagnosis:: hypoglycemia A-fib How have you been since you were released from the hospital?: Better Any questions or concerns?: Yes Patient Questions/Concerns:: Per patient he is taking the Brilinta and Eliquis. He woke up with his gums bleeding and he had a clot in his mouth. He stated it has decreased. Patient Questions/Concerns Addressed: Notified Provider of Patient Questions/Concerns (RN called Dr Excell Seltzer office and sent a message to him)  Items Reviewed: Did you receive and understand the discharge instructions provided?: Yes Medications obtained,verified, and reconciled?: Yes (Medications Reviewed) Any new allergies since your discharge?: No Dietary orders reviewed?: No Do you have support at home?: Yes People in Home: alone Name of Support/Comfort Primary Source: Patty and Kathlene November children  Medications Reviewed Today: Medications Reviewed Today     Reviewed by Luella Cook, RN (Case Manager) on 02/06/24 at 1321  Med List Status: <None>   Medication Order Taking? Sig Documenting Provider Last Dose Status Informant  acetaminophen (TYLENOL) 325 MG tablet [id number] Yes Take 2 tablets (650 mg total) by mouth every 6 (six) hours as needed for mild pain (pain score 1-3) (or Fever >/= 101). Marguerita Merles [place], [place] Taking Active   allopurinol (ZYLOPRIM) 100 MG tablet [id number] Yes TAKE ONE-HALF (1/2) TABLET EVERY MONDAY, [facility] AND FRIDAY  Patient taking differently: Take 50 mg by mouth every Monday, Wednesday, and Friday.   Joaquim Nam, MD Taking Active  Self, Pharmacy Records  Cholecalciferol (VITAMIN D) 1000 UNITS capsule [id number] Yes Take 1,000 Units by mouth at bedtime. [provider] Taking Active Self, Pharmacy Records  ELIQUIS 2.5 MG TABS tablet [id number] Yes TAKE 1 TABLET TWICE A DAY Runell Gess, MD Taking Active Self, Pharmacy Records  fish oil-omega-3 fatty acids 1000 MG capsule [id number] Yes Take 1 g by mouth at bedtime. [provider] Taking Active Self, Pharmacy Records  glucose blood (FREESTYLE LITE) test strip [id number] Yes USE TO TEST BLOOD SUGAR ONCE DAILY AND AS NEEDED Joaquim Nam, MD Taking Active Self, Pharmacy Records  hydrALAZINE (APRESOLINE) 25 MG tablet [id number] Yes Take 1 tablet (25 mg total) by mouth daily.  Patient taking differently: Take 25 mg by mouth at bedtime.   Runell Gess, MD Taking Active Self, Pharmacy Records  HYDROcodone-acetaminophen (NORCO/VICODIN) 5-325 MG tablet [id number]  Take 0.5-1 tablets by mouth 2 (two) times daily as needed for moderate pain (pain score 4-6). Joaquim Nam, MD  Active Self, Pharmacy Records  ipratropium (ATROVENT) 0.03 % nasal spray [id number] Yes USE 2 SPRAYS IN EACH NOSTRIL TWICE A DAY AS NEEDED FOR RHINITIS  Patient taking differently: Place 2 sprays into both nostrils as needed for rhinitis.   Joaquim Nam, MD Taking Active Self, Pharmacy Records  isosorbide mononitrate (IMDUR) 60 MG 24 hr tablet [id number] Yes Take 1.5 tablets (90 mg total) by mouth daily.  Patient taking differently: Take 90 mg by mouth at bedtime.   Runell Gess, MD Taking Active Self, Pharmacy Records           Med Note Safety [facility], Garnet Koyanagi   Wed Jan 31, 2024  3:59 PM) Patient stated he may have take 2  tablets last night  magnesium oxide (MAG-OX) 400 MG tablet [id number] Yes Take 1 tablet (400 mg total) by mouth every other day.  Patient taking differently: Take 400 mg by mouth every Monday, Wednesday, and Friday. Nights   Joaquim Nam, MD Taking Active  Self, Pharmacy Records  metoprolol succinate (TOPROL-XL) 100 MG 24 hr tablet [id number] Yes Take 1 tablet (100 mg total) by mouth daily. Take with or immediately following a meal. Merlene Laughter, DO Taking Active   nitroGLYCERIN (NITROSTAT) 0.4 MG SL tablet [id number] Yes DISSOLVE 1 TABLET UNDER THE TONGUE EVERY 5 MINUTES AS NEEDED FOR CHEST PAIN FOR A MAXIMUM OF 3 DOSES  Patient taking differently: Place 0.4 mg under the tongue every 5 (five) minutes as needed for chest pain.   Runell Gess, MD Taking Active Self, Pharmacy Records  pantoprazole (PROTONIX) 40 MG tablet [id number]  Take 1 tablet (40 mg total) by mouth 2 (two) times daily for 42 days, THEN 1 tablet (40 mg total) daily.  Patient taking differently:  THEN 1 tablet (40 mg total) by mouth every morning.   Joaquim Nam, MD  Expired 01/31/24 2359 Self, Pharmacy Records  polyethylene glycol (MIRALAX / GLYCOLAX) 17 g packet [id number] Yes Take 17 g by mouth daily. [provider] Taking Active Self, Pharmacy Records  Potassium Gluconate 595 MG CAPS [id number] Yes Take 595 mg by mouth in the morning. [provider] Taking Active Self, Pharmacy Records  ranolazine (RANEXA) 500 MG 12 hr tablet [id number] Yes TAKE 1 TABLET TWICE A DAY Runell Gess, MD Taking Active Self, Pharmacy Records  rosuvastatin (CRESTOR) 10 MG tablet [id number] Yes Take 1 tablet (10 mg total) by mouth daily.  Patient taking differently: Take 10 mg by mouth in the morning.   Tereso Newcomer T, PA-C Taking Active Self, Pharmacy Records  ticagrelor [facility]) 90 MG TABS tablet [id number] Yes Take 1 tablet (90 mg total) by mouth 2 (two) times daily. Sheikh, Omair [place], [place] Taking Active   torsemide (DEMADEX) 20 MG tablet [id number] Yes TAKE 2 TABLETS IN THE MORNING AND 1 TABLET IN THE EVENING  Patient taking differently: Take 20-40 mg by mouth See admin instructions. Take 2 tablets by mouth in the morning and then take 1 tablet by mouth in the evening.    Tonny Bollman, MD Taking Active Self, Pharmacy Records  Med List Note Azucena Fallen, CPhT 01/31/24 1615): [facility] is his preferred long term medication pharmacy            Home Care and Equipment/Supplies: Were Home Health Services Ordered?: Yes Name of Home Health Agency:: [facility] Has Agency set up a time to come to your home?: Yes First Home Health Visit Date: 02/05/24 Any new equipment or medical supplies ordered?: NA  Functional Questionnaire: Do you need assistance with bathing/showering or dressing?: No Do you need assistance with meal preparation?: Yes Do you need assistance with eating?: No Do you have difficulty maintaining continence: No Do you need assistance with getting out of bed/getting out of a chair/moving?: No Do you have difficulty managing or taking your medications?: No  Follow up appointments reviewed: PCP Follow-up appointment confirmed?: Yes Date of PCP follow-up appointment?: 02/08/24 Follow-up Provider: Dr Para March [facility] Follow-up appointment confirmed?: Yes Date of Specialist follow-up appointment?: 02/23/24 Follow-Up Specialty Provider:: Dr Excell Seltzer Do you need transportation to your follow-up appointment?: No Do you understand care options if your condition(s) worsen?: Yes-patient verbalized understanding  SDOH Interventions Today    Flowsheet  Row Most Recent Value  SDOH Interventions   Food Insecurity Interventions Intervention Not Indicated  Housing Interventions Intervention Not Indicated  Transportation Interventions Intervention Not Indicated, Patient Resources (Friends/Family)  Utilities Interventions Intervention Not Indicated       Goals Addressed             This Visit's Progress    TOC Care Plan       Current Barriers:  Knowledge Deficits related to plan of care for management of Hypoglycemia associated with diabetes  and Atrial fib  RNCM Clinical Goal(s):  Patient will work with the Care  Management team over the next 30 days to address Transition of Care Barriers: Medication Management take all medications exactly as prescribed and will call provider for medication related questions as evidenced by Electronic Medical Record attend all scheduled medical appointments: PCP and Specialists as evidenced by Electronic Medical Record  through collaboration with RN Care manager, provider, and care team.   Interventions: Evaluation of current treatment plan related to  self management and patient's adherence to plan as established by provider  Transitions of Care:  New goal. Doctor Visits  - discussed the importance of doctor visits Contacted provider for patient needs Bleeding from the gums while on Brilinta and Eliquis Arranged PCP follow-up within 7 days (Care Guide Scheduled)  Patient Goals/Self-Care Activities: Participate in Transition of Care Program/Attend [facility] scheduled calls Notify RN Care Manager of TOC call rescheduling needs Take all medications as prescribed Attend all scheduled provider appointments Call pharmacy for medication refills 3-7 days in advance of running out of medications Perform all self care activities independently  Call provider office for new concerns or questions   Follow Up Plan:  Telephone follow up appointment with care management team member scheduled for:  [facility] 02/14/2024 1:15 The patient has been provided with contact information for the care management team and has been advised to call with any health related questions or concerns.  Follow up with provider re: Bleeding from gums Next PCP appointment scheduled for: [id number]        Interventions Today    Flowsheet Row Most Recent Value  Chronic Disease   Chronic disease during today's visit Other  [Hypoglycemia associated with diabetes]  General Interventions   General Interventions Discussed/Reviewed General Interventions Discussed, General Interventions Reviewed, Doctor  Visits, Referral to Nurse, Communication with  Doctor Visits Discussed/Reviewed Doctor Visits Discussed, Doctor Visits Reviewed, PCP, Specialist  PCP/Specialist Visits Compliance with follow-up visit  Communication with RN, PCP/Specialists  [RN contacted Dr Excell Seltzer office regarding the brilinta and the elquis. Patient is have bleeding from gums. Patient also was taking briinta once a day.]  Exercise Interventions   Exercise Discussed/Reviewed Exercise Discussed, Exercise Reviewed  [PT/OT with [facility]]  Education Interventions   Education Provided Provided Education  Provided Verbal Education On Medication  Pharmacy Interventions   Pharmacy Dicussed/Reviewed Pharmacy Topics Discussed, Pharmacy Topics Reviewed  [The patient had not been taking his Brilinta as ordered. RN coontacted Dr Excell Seltzer office due to the bleeding and him even taking a lower dose.]  Safety Interventions   Safety Discussed/Reviewed Safety Discussed, Fall Risk, Safety Reviewed, Home Safety  Home Safety Assistive Devices  [RN discussed using walker at all times, removing throw rugs on floor. Being aware of surroundings to prevent falls.]      Gean Maidens [name] RN [facility] Care Management Coordinator [email or web address] Direct Dial: [phone]  Fax: [phone] Website: [email or web address]

## 2024-02-06 NOTE — Telephone Encounter (Signed)
 Scarlette Calico P.,RN with [facility] called in stating pt was discharged from hospital 02/03/24. She states they changed pt Brilinta to take twice daily now and he is still taking eliquis 2x daily. She states pt woke up this morning and his mouth/gums were bleeding profusely and he also noticed a blood clot in his mouth. She states pt would like to stop taking Birlinta but wants to speak with Dr. Excell Seltzer first.

## 2024-02-08 ENCOUNTER — Encounter: Payer: Self-pay | Admitting: Family Medicine

## 2024-02-08 ENCOUNTER — Ambulatory Visit (INDEPENDENT_AMBULATORY_CARE_PROVIDER_SITE_OTHER): Admitting: Family Medicine

## 2024-02-08 VITALS — BP 118/62 | HR 87 | Temp 97.9°F | Ht 67.0 in | Wt 154.4 lb

## 2024-02-08 DIAGNOSIS — I5020 Unspecified systolic (congestive) heart failure: Secondary | ICD-10-CM | POA: Diagnosis not present

## 2024-02-08 DIAGNOSIS — E1140 Type 2 diabetes mellitus with diabetic neuropathy, unspecified: Secondary | ICD-10-CM

## 2024-02-08 DIAGNOSIS — I5030 Unspecified diastolic (congestive) heart failure: Secondary | ICD-10-CM | POA: Diagnosis not present

## 2024-02-08 DIAGNOSIS — I5023 Acute on chronic systolic (congestive) heart failure: Secondary | ICD-10-CM | POA: Diagnosis not present

## 2024-02-08 DIAGNOSIS — E1149 Type 2 diabetes mellitus with other diabetic neurological complication: Secondary | ICD-10-CM

## 2024-02-08 DIAGNOSIS — M792 Neuralgia and neuritis, unspecified: Secondary | ICD-10-CM

## 2024-02-08 DIAGNOSIS — I130 Hypertensive heart and chronic kidney disease with heart failure and stage 1 through stage 4 chronic kidney disease, or unspecified chronic kidney disease: Secondary | ICD-10-CM | POA: Diagnosis not present

## 2024-02-08 DIAGNOSIS — I4821 Permanent atrial fibrillation: Secondary | ICD-10-CM | POA: Diagnosis not present

## 2024-02-08 DIAGNOSIS — I5022 Chronic systolic (congestive) heart failure: Secondary | ICD-10-CM

## 2024-02-08 DIAGNOSIS — E1122 Type 2 diabetes mellitus with diabetic chronic kidney disease: Secondary | ICD-10-CM | POA: Diagnosis not present

## 2024-02-08 DIAGNOSIS — N184 Chronic kidney disease, stage 4 (severe): Secondary | ICD-10-CM | POA: Diagnosis not present

## 2024-02-08 DIAGNOSIS — D631 Anemia in chronic kidney disease: Secondary | ICD-10-CM | POA: Diagnosis not present

## 2024-02-08 LAB — PHOSPHORUS: Phosphorus: 3.1 mg/dL (ref 2.3–4.6)

## 2024-02-08 LAB — CBC WITH DIFFERENTIAL/PLATELET
Basophils Absolute: 0 10*3/uL (ref 0.0–0.1)
Basophils Relative: 0.4 % (ref 0.0–3.0)
Eosinophils Absolute: 0.1 10*3/uL (ref 0.0–0.7)
Eosinophils Relative: 1.5 % (ref 0.0–5.0)
HCT: 31.3 % — ABNORMAL LOW (ref 39.0–52.0)
Hemoglobin: 10.1 g/dL — ABNORMAL LOW (ref 13.0–17.0)
Lymphocytes Relative: 12.8 % (ref 12.0–46.0)
Lymphs Abs: 0.8 10*3/uL (ref 0.7–4.0)
MCHC: 32.4 g/dL (ref 30.0–36.0)
MCV: 96.4 fl (ref 78.0–100.0)
Monocytes Absolute: 0.7 10*3/uL (ref 0.1–1.0)
Monocytes Relative: 11.3 % (ref 3.0–12.0)
Neutro Abs: 4.4 10*3/uL (ref 1.4–7.7)
Neutrophils Relative %: 74 % (ref 43.0–77.0)
Platelets: 165 10*3/uL (ref 150.0–400.0)
RBC: 3.24 Mil/uL — ABNORMAL LOW (ref 4.22–5.81)
RDW: 18 % — ABNORMAL HIGH (ref 11.5–15.5)
WBC: 6 10*3/uL (ref 4.0–10.5)

## 2024-02-08 LAB — COMPREHENSIVE METABOLIC PANEL WITH GFR
ALT: 17 U/L (ref 0–53)
AST: 28 U/L (ref 0–37)
Albumin: 3.7 g/dL (ref 3.5–5.2)
Alkaline Phosphatase: 82 U/L (ref 39–117)
BUN: 42 mg/dL — ABNORMAL HIGH (ref 6–23)
CO2: 31 meq/L (ref 19–32)
Calcium: 9.6 mg/dL (ref 8.4–10.5)
Chloride: 97 meq/L (ref 96–112)
Creatinine, Ser: 3.06 mg/dL — ABNORMAL HIGH (ref 0.40–1.50)
GFR: 17.38 mL/min — ABNORMAL LOW (ref >60.00)
Glucose, Bld: 208 mg/dL — ABNORMAL HIGH (ref 70–99)
Potassium: 3.9 meq/L (ref 3.5–5.1)
Sodium: 138 meq/L (ref 135–145)
Total Bilirubin: 1.4 mg/dL — ABNORMAL HIGH (ref 0.2–1.2)
Total Protein: 6.9 g/dL (ref 6.0–8.3)

## 2024-02-08 LAB — MAGNESIUM: Magnesium: 2.3 mg/dL (ref 1.5–2.5)

## 2024-02-08 MED ORDER — TORSEMIDE 20 MG PO TABS: ORAL_TABLET | ORAL | Status: DC

## 2024-02-08 MED ORDER — CLOPIDOGREL BISULFATE 75 MG PO TABS: ORAL_TABLET | ORAL | Status: DC

## 2024-02-08 NOTE — Patient Instructions (Signed)
 Go to the lab on the way out.   If you have mychart we'll likely use that to update you.    Take care.  Glad to see you. Go back to 40mg  of torsemide in the mornings if your weight is increasing.

## 2024-02-08 NOTE — Progress Notes (Signed)
 Inpatient f/u.   He was changed back to plavix and is off brilinta.   No gum bleeding now.  He had gum bleeding while taking Brilinta.  Not taking hydrocodone in the last week.    He is off insulin.  Sugar has been 140-150s recently.  Discussed.  Discussed inpatient course. Recommendations at discharge:    Follow-up with PCP within 1 to 2 weeks repeat CBC, CMP, mag, Phos within 1 week Follow-up with cardiology in outpatient setting for closer evaluation continue Brilinta and apixaban for discharge   Discharge Diagnoses: Principal Problem:   Hypoglycemia associated with diabetes ([name]) Active Problems:   Permanent atrial fibrillation ([name])   CKD (chronic kidney disease) stage 4, GFR 15-29 ml/min ([name])   HTN (hypertension)   HLD (hyperlipidemia)   Sleep apnea   H/O mitral valve repair   Chronic systolic CHF (congestive heart failure) ([name])   DNR (do not resuscitate)   Diastolic congestive heart failure ([name])   Chronic atrial fibrillation ([name])   Hypoglycemia   S/P coronary artery stent placement   Atherosclerosis of native coronary artery of native heart without angina pectoris   Resolved Problems:   * No resolved hospital problems. *   Hospital Course: 89 y.o. male with medical history significant of macular degeneration, afib, CAD, stage 4 CKD, chronic systolic CHF, HTN, HLD, DM, and OSA on CPAP who presented on 3/19 with hypoglycemia.  Patient apparently took more insulin than he should have for unclear reasons.  There was also concern for congestive heart failure exacerbation from his aortic stenosis.  Patient was subsequently hospitalized for further management.   Patient underwent cardiac catheterization is planned for a workup for his TAVR and and is status post phenous vein graft intervention on a background of permanent atrial fibrillation.     Cardiology initially triple therapy for now and stopping aspirin after a month and continuing with clopidogrel for 6 months.  But  after further discussion it was found that he is a nonresponder to Plavix was shared decision was made to stop the aspirin and Plavix and change the patient to Brilinta and continue Brilinta and apixaban at discharge.  PT OT evaluated and he did well and recommended home health.  He is medically stable and cleared by cardiology to follow-up in outpatient setting with PCP and cardiology.   Assessment and Plan:   Hypoglycemia: Apparently took more insulin than he should have.  He also had recent GI illness as a result of which she had poor oral intake.  I think both these issues caused a significant drop in his glucose levels. Lantus was held. CBGs have stabilized.  Will change to before meals and at bedtime checks continue to monitor CBGs.  Continue to hold insulin.  Check HbA1c was 6.4.  Last HbA1c was 7.0 in December.  Continue monitor CBGs per protocol ranging from 94-142.  Will discontinue insulin at discharge.   Chronic Systolic CHF/Severe aortic stenosis/coronary artery disease s/p stenting/mitral regurgitation: Followed by Cardiology.  ECHOCardiogram from January showed EF of 30 to 35%. Seen by cardiology during this admission.  Underwent Cardiac Cath as TAVR workup for aortic stenosis.  Cath was done and he had a stent placed and is now on antiplatelets however per discussion with cardiology we will change his aspirin Plavix suggest Brilinta and continue apixaban. Cardiology does not feel that he needs a TAVR given that he has moderate aortic stenosis. Cardiac status otherwise noted to be stable. He is noted to be on aspirin,  hydralazine, isosorbide mononitrate, metoprolol, Ranexa, rosuvastatin and torsemide.He is status post MitraClip in 2021.  Other care per cardiology.  Cardiology has now stopped his aspirin Plavix given his his nonresponder to Plavix from his P2 Y12 assay which was 266. -Cardiology recommends Brilinta loading dose prior to discharge and maintenance dose of 90 mg twice daily  starting 323 along with apixaban   History of Atrial Fibrillation: Continue metoprolol.  Resume Eliquis and follow-up with cardiology in outpatient setting.   Essential Hypertension: Monitor blood pressures closely. Last BP reading was 107/58   Normocytic Anemia: Hemoglobin has been on the lower side but stable for the most part. Hgb/Hct is now 9.9/31.2. Check Anemia Panel in the outpatient setting. CTM for S/Sx of Bleeding and repeat CBC within 1 week   CKD Stage IV -Baseline creatinine is between 2.7-3.0.  Creatinine close to baseline.  -BUN/Cr Trend:        Recent Labs  Lab 01/09/24 1529 01/31/24 0950 02/01/24 0231 02/02/24 0802 02/03/24 0209  BUN 47* 45* 38* 34* 34*  CREATININE 2.92* 2.89* 2.77* 2.62* 2.62*  -Apparently had been taking ibuprofen at home and he was told not to ever take this medicine or any other NSAIDs. -Avoid Nephrotoxic Medications, Contrast Dyes, Hypotension and Dehydration to Ensure Adequate Renal Perfusion and will need to Renally Adjust Meds. CTM and Trend Renal Function carefully and repeat CMP in the AM    Obstructive Sleep Apnea: CPAP   Hypoalbuminemia: Patient's Albumin Trend:      Recent Labs  Lab 01/31/24 0950 02/02/24 0802 02/03/24 0209  ALBUMIN 3.1* 3.0* 3.0*  -Continue to Monitor and Trend and repeat CMP in the AM ============================  No CP but he has DOE.  Not SOB supine.  Sig less BLE edema and he was able to cut back from total daily dose of 60mg  torsemide to 40mg  today.  Today is the first day on the lower dose.  D/w pt about dosing options.  He had been taking 40 mg in the morning and 20 mg later on.  He was able to cut back to 20 mg twice a day today.  He has occ pin prick sensation in the calves B, L>>R.    Meds, vitals, and allergies reviewed.   ROS: Per HPI unless specifically indicated in ROS section   Nad Ncat Neck supple no LA Ctab IRR Almost no bruising R inguinal cath site.  Covered with bandaid.   1+ BLE  edema.

## 2024-02-09 ENCOUNTER — Ambulatory Visit (HOSPITAL_BASED_OUTPATIENT_CLINIC_OR_DEPARTMENT_OTHER): Payer: Medicare Other | Admitting: Internal Medicine

## 2024-02-09 LAB — VITAMIN B12: Vitamin B-12: 464 pg/mL (ref 211–911)

## 2024-02-11 ENCOUNTER — Other Ambulatory Visit: Payer: Self-pay | Admitting: Family Medicine

## 2024-02-11 DIAGNOSIS — I5020 Unspecified systolic (congestive) heart failure: Secondary | ICD-10-CM

## 2024-02-11 DIAGNOSIS — E1140 Type 2 diabetes mellitus with diabetic neuropathy, unspecified: Secondary | ICD-10-CM | POA: Insufficient documentation

## 2024-02-11 DIAGNOSIS — M792 Neuralgia and neuritis, unspecified: Secondary | ICD-10-CM | POA: Insufficient documentation

## 2024-02-11 NOTE — Assessment & Plan Note (Signed)
 History of, off insulin currently.  He has neuropathic pain that could be caused by diabetes.  Would stay off insulin for now.

## 2024-02-11 NOTE — Assessment & Plan Note (Signed)
 Continue Eliquis and Plavix for now.  No bleeding on that combination now.

## 2024-02-11 NOTE — Assessment & Plan Note (Signed)
 D/w pt about dosing options for torsemide.  He had been taking 40 mg in the morning and 20 mg later on.  He was able to cut back to 20 mg twice a day today.  He can return to 40 mg in the morning and 20 mg later on if he has increasing weight.  Recheck labs pending.  He is going to follow-up with cardiology.  Continue Plavix and Eliquis.  He did not tolerate Brilinta due to bleeding gums.  He is still in the midst of planning for TAVR per cardiology.

## 2024-02-12 ENCOUNTER — Ambulatory Visit (INDEPENDENT_AMBULATORY_CARE_PROVIDER_SITE_OTHER): Admitting: General Practice

## 2024-02-12 ENCOUNTER — Encounter: Payer: Self-pay | Admitting: General Practice

## 2024-02-12 ENCOUNTER — Ambulatory Visit: Payer: Self-pay

## 2024-02-12 ENCOUNTER — Telehealth: Payer: Self-pay | Admitting: Cardiovascular Disease

## 2024-02-12 ENCOUNTER — Ambulatory Visit (HOSPITAL_COMMUNITY): Admission: RE | Admit: 2024-02-12 | Source: Home / Self Care | Admitting: Cardiovascular Disease

## 2024-02-12 VITALS — BP 110/50 | HR 60 | Temp 97.7°F | Ht 67.0 in | Wt 157.0 lb

## 2024-02-12 DIAGNOSIS — I5023 Acute on chronic systolic (congestive) heart failure: Secondary | ICD-10-CM | POA: Diagnosis not present

## 2024-02-12 DIAGNOSIS — L509 Urticaria, unspecified: Secondary | ICD-10-CM | POA: Insufficient documentation

## 2024-02-12 DIAGNOSIS — N184 Chronic kidney disease, stage 4 (severe): Secondary | ICD-10-CM | POA: Diagnosis not present

## 2024-02-12 DIAGNOSIS — I130 Hypertensive heart and chronic kidney disease with heart failure and stage 1 through stage 4 chronic kidney disease, or unspecified chronic kidney disease: Secondary | ICD-10-CM | POA: Diagnosis not present

## 2024-02-12 DIAGNOSIS — D631 Anemia in chronic kidney disease: Secondary | ICD-10-CM | POA: Diagnosis not present

## 2024-02-12 DIAGNOSIS — E1122 Type 2 diabetes mellitus with diabetic chronic kidney disease: Secondary | ICD-10-CM | POA: Diagnosis not present

## 2024-02-12 DIAGNOSIS — I5030 Unspecified diastolic (congestive) heart failure: Secondary | ICD-10-CM | POA: Diagnosis not present

## 2024-02-12 MED ORDER — PREDNISONE 10 MG PO TABS: ORAL_TABLET | ORAL | 0 refills | Status: DC

## 2024-02-12 MED ORDER — HYDROXYZINE PAMOATE 25 MG PO CAPS: 25.0000 mg | ORAL_CAPSULE | Freq: Three times a day (TID) | ORAL | 0 refills | Status: DC | PRN

## 2024-02-12 NOTE — Telephone Encounter (Signed)
 Spoke with patient and he states he had rash/welps on on his legs. He also states his legs has some fluid. Because of this his PCP stopped plavix temporarily and started him on prednisone. He wanted to make you aware. He states his children and PCP think its from the plavix. He will call back to let us know if symptoms resolved

## 2024-02-12 NOTE — Progress Notes (Signed)
 Established Patient Office Visit  Subjective   Patient ID: [name], male    DOB: 02-Sep-1932  Age: 89 y.o. MRN: [id number]  Chief Complaint  Patient presents with   Urticaria    On left leg since yesterday and swelling in both feet x 1 month. Had started plavix 4 days ago; changed from [place]. Had to stop due to gums bleeding.     Urticaria Pertinent negatives include no fever or shortness of breath.    [name] is a 89 year old male, patient of Dr. Para March, with past medical history of HTN, CAD, CHF, Afib, GERD, type 2 DM, HLD presents today for an acute visit.   His daughter [name] and son, [name] are both present today.   Symptom onset one day ago. He reports that he was started on Plavix 4 days ago after discontinuing Brilinta. He denies any itching. He does have intermittent, sharp pain, which was lasts 5-10 seconds. He reports that the left knee has been swollen for the past month but the rash is new. No other places other than the leg and left foot. He reports his last dose was yesterday around 10pm. He denies hematuria or blood in stool. He was evaluated on 02/08/24 by Dr. Para March, during that visit, patient reported gum bleeding while taking Brilinta. He was changed back to Plavix. He has not contacted the cardiology.    Patient Active Problem List   Diagnosis Date Noted   Urticaria 02/12/2024   Neuropathic pain 02/11/2024   Diabetic neuropathy with neurologic complication ([name]) 02/11/2024   S/P coronary artery stent placement 02/03/2024   Atherosclerosis of native coronary artery of native heart without angina pectoris 02/03/2024   Diastolic congestive heart failure ([name]) 02/01/2024   Chronic atrial fibrillation ([name]) 02/01/2024   Hypoglycemia 02/01/2024   Hypoglycemia associated with diabetes ([name]) 01/31/2024   Chronic systolic CHF (congestive heart failure) ([name]) 01/31/2024   DNR (do not resuscitate) 01/31/2024   Aortic stenosis 12/19/2023   Healthcare  maintenance 11/22/2023   Shoulder pain 11/22/2023   Prolonged QT interval 08/18/2023   Gastritis and gastroduodenitis 08/18/2023   Duodenal ulcer 08/18/2023   Bilateral lower extremity edema 03/01/2023   Skin lesion 11/06/2022   Laceration of right lower extremity 11/25/2021   Rhinitis 11/01/2020   Non-rheumatic mitral regurgitation 02/06/2020   H/O mitral valve repair 02/06/2020   Permanent atrial fibrillation ([name])    CAD (coronary artery disease) 08/02/2018   Macular degeneration 06/24/2018   Abdominal wall hernia 05/24/2017   History of lower GI bleeding    Pulmonary hypertension ([name])    HFrEF (heart failure with reduced ejection fraction) ([name]) 11/20/2015   Gout 09/09/2014   Advance care planning 09/09/2014   Chronic anticoagulation    RBBB (right bundle branch block)    Carotid artery disease ([name])    Hx of CABG    CKD (chronic kidney disease) stage 4, GFR 15-29 ml/min ([name]) 03/24/2011   Peripheral sensory neuropathy due to type 2 diabetes mellitus ([name])    GERD    Sleep apnea    HLD (hyperlipidemia)    HTN (hypertension)    Past Medical History:  Diagnosis Date   Age-related macular degeneration, wet, both eyes ([name])    Anemia    Secondary to acute blood loss   Anginal pain ([name])    last chest pain in Feb 2021   Arthritis    "mild in hands, knees, ankles" (11/13/2015)   Atrial fibrillation ([name])    Consideration  was given for atrial flutter ablation, but patient developed atrial fibrillation. Cardioversion was done. Dr. Graciela Husbands decided to watch him clinically. November, 2011   Atrial flutter [facility]) 07/2010   September, 2011   Hospital with PNA and cath done.Marland KitchenMarland KitchenCoumadin.  Atrial flutter ablation planned, but  pt. then had atrial fibrillation,/outpatient conversion 09/08/10..NSR..plan to follow..Dr. Graciela Husbands   CAD (coronary artery disease)    Catheterization, September, 2011,  grafts patent from redo CABG,, medical therapy of coronary disease, consideration to proceeding  with atrial flutter ablation   Carotid artery disease ([name])    Doppler 09/18/2009 - 49% bilateral stenoses   Carotid artery disease ([name])    49% bilateral, Doppler, November, 2010   CHF (congestive heart failure) ([name])    Chronic kidney disease (CKD), stage III (moderate) ([name])    Diabetic peripheral neuropathy ([name])    feet   Diverticulosis of colon with hemorrhage 2009   several unit diverticular bleed    Erectile dysfunction    Mild   Gout    Hearing loss    wears hearing aids   History of blood transfusion "several"   related to diverticular bleeding   Hyperlipidemia    Hypertension    Mitral regurgitation    Mild, echo, September, 2011   NSTEMI (non-ST elevated myocardial infarction) ([name]) 07/2010   at [facility] with repeat cath, rec medical mgmt    OSA on CPAP    uses CPAP nightly   Personal history of colonic polyps    PNA (pneumonia) 07/2010   NSTEMI at [facility] with repeat cath, rec medical mgmt    RBBB (right bundle branch block)    S/P mitral valve repair 02/06/2020   s/p TEER with a single MitraClip NTW placed on A2/P2   Shoulder pain    "positional; better now" (11/13/2015)   Skin cancer    R lower leg, per derm 2012   Type II diabetes mellitus ([name])    Past Surgical History:  Procedure Laterality Date   CARDIAC CATHETERIZATION  2006   Nuclear..slight lateral ischemia..medical therapy   CARDIAC CATHETERIZATION  08/04/2010   grafts patent from redo CABG...medical Rx and ablate Atrial flutter (LV not injected)    CARDIOVERSION  ~ 2010   CAROTID ENDARTERECTOMY Right 1994   CATARACT EXTRACTION W/ INTRAOCULAR LENS  IMPLANT, BILATERAL Bilateral    CHOLECYSTECTOMY     COLON RESECTION N/A 01/13/2016   Procedure: EXPLORATORY LAPAROTOMY, LEFT AND SIGMOID COLON REMOVAL;  Surgeon: Axel Filler, MD;  Location: [facility] OR;  Service: General;  Laterality: N/A;  Extended open left hemicolectomy and sigmoidectomy    COLONOSCOPY N/A 11/14/2015   Procedure: COLONOSCOPY;  Surgeon:  Ruffin Frederick, MD;  Location: [facility] ENDOSCOPY;  Service: Gastroenterology;  Laterality: N/A;   COLONOSCOPY Left 01/05/2016   Procedure: COLONOSCOPY;  Surgeon: Ruffin Frederick, MD;  Location: [facility] ENDOSCOPY;  Service: Gastroenterology;  Laterality: Left;  no sedation to start, moderate if needed   COLONOSCOPY W/ POLYPECTOMY     CORONARY ARTERY BYPASS GRAFT  1995; 2006   "X 3; X3"   CORONARY ARTERY BYPASS GRAFT  1995, 2006   CORONARY STENT INTERVENTION N/A 02/02/2024   Procedure: CORONARY STENT INTERVENTION;  Surgeon: Tonny Bollman, MD;  Location: [facility] INVASIVE CV LAB;  Service: Cardiovascular;  Laterality: N/A;   DOPPLER ECHOCARDIOGRAPHY  08/2008   EF 60%   DOPPLER ECHOCARDIOGRAPHY  08/02/2010   65-70%   DOPPLER ECHOCARDIOGRAPHY  07/2010   MR mild   ESOPHAGOGASTRODUODENOSCOPY N/A 06/19/2014   Procedure: ESOPHAGOGASTRODUODENOSCOPY (EGD);  Surgeon: Beverley Fiedler, MD;  Location: [facility] ENDOSCOPY;  Service: Endoscopy;  Laterality: N/A;   ESOPHAGOGASTRODUODENOSCOPY N/A 11/14/2015   Procedure: ESOPHAGOGASTRODUODENOSCOPY (EGD);  Surgeon: Ruffin Frederick, MD;  Location: [facility] ENDOSCOPY;  Service: Gastroenterology;  Laterality: N/A;   ESOPHAGOGASTRODUODENOSCOPY (EGD) WITH PROPOFOL N/A 08/18/2023   Procedure: ESOPHAGOGASTRODUODENOSCOPY (EGD) WITH PROPOFOL;  Surgeon: Shellia Cleverly, DO;  Location: [facility] ENDOSCOPY;  Service: Gastroenterology;  Laterality: N/A;   FLEXIBLE SIGMOIDOSCOPY N/A 11/16/2015   Procedure: FLEXIBLE SIGMOIDOSCOPY;  Surgeon: Ruffin Frederick, MD;  Location: [facility] ENDOSCOPY;  Service: Gastroenterology;  Laterality: N/A;   LAPAROSCOPIC CHOLECYSTECTOMY  2008   LEFT HEART CATH N/A 06/14/2014   Procedure: LEFT HEART CATH;  Surgeon: Lesleigh Noe, MD;  Location: [facility] CATH LAB;  Service: Cardiovascular;  Laterality: N/A;   LEFT HEART CATH AND CORONARY ANGIOGRAPHY N/A 08/02/2018   Procedure: LEFT HEART CATH AND CORONARY ANGIOGRAPHY;  Surgeon: Runell Gess, MD;   Location: [facility] INVASIVE CV LAB;  Service: Cardiovascular;  Laterality: N/A;   LEFT HEART CATHETERIZATION WITH CORONARY/GRAFT ANGIOGRAM N/A 06/11/2014   Procedure: LEFT HEART CATHETERIZATION WITH Isabel Caprice;  Surgeon: Lesleigh Noe, MD;  Location: [facility] CATH LAB;  Service: Cardiovascular;  Laterality: N/A;   PERCUTANEOUS CORONARY STENT INTERVENTION (PCI-S) N/A 06/13/2014   Procedure: PERCUTANEOUS CORONARY STENT INTERVENTION (PCI-S);  Surgeon: Lesleigh Noe, MD;  Location: [facility] CATH LAB;  Service: Cardiovascular;  Laterality: N/A;   RIGHT HEART CATH N/A 01/06/2020   Procedure: RIGHT HEART CATH;  Surgeon: Dolores Patty, MD;  Location: [facility] INVASIVE CV LAB;  Service: Cardiovascular;  Laterality: N/A;   RIGHT/LEFT HEART CATH AND CORONARY/GRAFT ANGIOGRAPHY N/A 02/02/2024   Procedure: RIGHT/LEFT HEART CATH AND CORONARY/GRAFT ANGIOGRAPHY;  Surgeon: Tonny Bollman, MD;  Location: [facility] INVASIVE CV LAB;  Service: Cardiovascular;  Laterality: N/A;   SKIN CANCER EXCISION Left 10/2015   calf   SKIN CANCER EXCISION Right 2014?   chest   TEE WITHOUT CARDIOVERSION N/A 01/06/2020   Procedure: TRANSESOPHAGEAL ECHOCARDIOGRAM (TEE);  Surgeon: Dolores Patty, MD;  Location: [facility] ENDOSCOPY;  Service: Cardiovascular;  Laterality: N/A;   TONSILLECTOMY     TRANSCATHETER MITRAL EDGE TO EDGE REPAIR N/A 02/06/2020   Procedure: MITRAL VALVE REPAIR;  Surgeon: Tonny Bollman, MD;  Location: [facility] INVASIVE CV LAB;  Service: Cardiovascular;  Laterality: N/A;   VASECTOMY     Allergies  Allergen Reactions   Brilinta [Ticagrelor]     bleeding   Nsaids     CKD         02/08/2024   12:11 PM 01/23/2024   11:45 AM 01/09/2024    3:52 PM  Depression screen PHQ 2/9  Decreased Interest 0 1 3  Down, Depressed, Hopeless 0 0 1  PHQ - 2 Score 0 1 4  Altered sleeping 0 0 1  Tired, decreased energy 1 2 3   Change in appetite 0 0 0  Feeling bad or failure about yourself  0 0 0  Trouble concentrating 0 0 0   Moving slowly or fidgety/restless 0 0 0  Suicidal thoughts 0 0 0  PHQ-9 Score 1 3 8   Difficult doing work/chores Somewhat difficult Somewhat difficult Somewhat difficult       02/08/2024   12:11 PM 01/23/2024   11:45 AM 01/09/2024    3:52 PM 11/20/2023    2:28 PM  GAD 7 : Generalized Anxiety Score  Nervous, Anxious, on Edge 1 1 0 0  Control/stop worrying 0 1 1 0  Worry too  much - different things 1 1 0 0  Trouble relaxing 1 1 0 0  Restless 0 0 0 0  Easily annoyed or irritable 1 1 0 0  Afraid - awful might happen 1 1 0 0  Total GAD 7 Score 5 6 1  0  Anxiety Difficulty Somewhat difficult Somewhat difficult Not difficult at all Not difficult at all      Review of Systems  Constitutional:  Negative for chills and fever.  Respiratory:  Negative for shortness of breath.   Cardiovascular:  Positive for leg swelling. Negative for chest pain.       Left leg swelling( has been there for a month per patient)  Skin:  Positive for itching and rash.  Neurological:  Negative for dizziness and headaches.      Objective:     BP (!) 110/50 (BP Location: Left Arm, Patient Position: Sitting, Cuff Size: Normal)   Pulse 60   Temp 97.7 F (36.5 C) (Oral)   Ht 5\' 7"  (1.702 m)   Wt 157 lb (71.2 kg)   SpO2 94%   BMI 24.59 kg/m  BP Readings from Last 3 Encounters:  02/12/24 (!) 110/50  02/08/24 118/62  02/03/24 (!) 140/55   Wt Readings from Last 3 Encounters:  02/12/24 157 lb (71.2 kg)  02/08/24 154 lb 6 oz (70 kg)  02/01/24 159 lb 13.3 oz (72.5 kg)      Physical Exam Vitals and nursing note reviewed.  Constitutional:      Appearance: Normal appearance.  Cardiovascular:     Rate and Rhythm: Normal rate and regular rhythm.     Pulses: Normal pulses.     Heart sounds: Normal heart sounds.  Pulmonary:     Effort: Pulmonary effort is normal.     Breath sounds: Normal breath sounds.  Neurological:     Mental Status: He is alert and oriented to person, place, and time.  Psychiatric:         Mood and Affect: Mood normal.        Behavior: Behavior normal.        Thought Content: Thought content normal.        Judgment: Judgment normal.        No results found for any visits on 02/12/24.     The ASCVD Risk score (Arnett DK, et al., 2019) failed to calculate for the following reasons:   The 2019 ASCVD risk score is only valid for ages 64 to 74   Risk score cannot be calculated because patient has a medical history suggesting prior/existing ASCVD    Assessment & Plan:  Urticaria Assessment & Plan: Symptoms suggestive of an allergic reaction to Plavix.   Advised patient to stop Plavix for one day and contact cardiology asap.   Rx sent for prednisone 20mg  x 2, 10 mg x 2, 10 x1.  Rx sent for hydroxyzine to help with itching. Discussed side effects with patient, daughter and son.   Discussed to schedule f/u with pcp if symptoms worsen or fail to improve. I will also send a message to his cardiologist.  Orders: -     hydrOXYzine Pamoate; Take 1 capsule (25 mg total) by mouth every 8 (eight) hours as needed.  Dispense: 30 capsule; Refill: 0 -     predniSONE; 20 mg x 2 days. 10 mg x 2 days. Then 10 mg for 1 day then stop.  Dispense: 7 tablet; Refill: 0     Return if symptoms worsen or fail  to improve.    Modesto Charon, NP

## 2024-02-12 NOTE — Telephone Encounter (Signed)
 Pt c/o medication issue:  1. Name of Medication: clopidogrel (PLAVIX) 75 MG tablet   2. How are you currently taking this medication (dosage and times per day)? Stopped taking today  3. Are you having a reaction (difficulty breathing--STAT)? Leg swelling accompanied by rash  4. What is your medication issue? Pcp office stopped plavix temp and pt will start prednisone

## 2024-02-12 NOTE — Telephone Encounter (Signed)
 Spoke with patients daughter [name] who is on the [place]. Patient has been scheduled to see B[place] today 02/12/24 and advised to skip a dose of Plavix

## 2024-02-12 NOTE — Patient Instructions (Addendum)
 Stop plavix.   Start prednisone.   Take hydroxyzine as needed.   Call cardiologist regarding the brilinta and plavix.   Schedule follow up with PCP if symptoms worsen or do not improve.   It was a pleasure to see you today!

## 2024-02-12 NOTE — Telephone Encounter (Signed)
 Chief Complaint: bruise and rash  Symptoms: bruise and rash to left leg Frequency: x1 day Pertinent Negatives: Patient denies fever Disposition: [] ED /[] Urgent Care (no appt availability in office) / [] Appointment(In office/virtual)/ []  [facility]/ [] Home Care/ [] Refused Recommended Disposition /[] [facility]/ [x]  Follow-up with PCP Additional Notes: Pt daughter states that he was started on Plavix about 4 days ago and now he had a bruise the size of an orange that is purple in color with hives around it just below his left knee. States that his feet are swollen as well. States that she is concerned it is from the plavix and would like to see Dr. Para March. No appt available, called [facility] and spoke with Vernona Rieger [facility] and she states that Dr. Para March and nurse are in a room. Daughter transferred to Vernona Rieger to assist to see if they can squeeze them in today.   Copied from CRM [id number]. Topic: Clinical - Red Word Triage >> Feb 12, 2024 10:38 AM Denese Killings wrote: Red Word that prompted transfer to Nurse Triage: Patient has a redish dark spot on his leg with hives around it as well as his foot(swollen). Patient daughter thinks his medication clopidogrel (PLAVIX) 75 MG tablet is causing it. Reason for Disposition  [1] Purple or blood-colored LOCALIZED rash AND [2] no fever AND [3] sounds well to triager  (Exception: Bruise from injury or friction.)  Answer Assessment - Initial Assessment Questions 1. APPEARANCE of RASH: "Describe the rash. What color is it?" (Note: It is difficult to assess rash color in people with darker-colored skin. When this situation occurs, simply ask the caller to describe what they see.)     Purple/red  2. SIZE: "How big are the spots?"     Size of an orange 3. LOCATION: "Where is the rash located?"     Left leg below the knee 4. ONSET: "When did the rash begin?"     yesterday 5. FEVER: "Do you have a fever?" If Yes, ask: "What is your temperature, how was it  measured, and when did it start?"     no 6. CAUSE: "What do you think is causing the rash?"     plavix 7. MEDICAL HISTORY: "Do you have any medical problems that can cause easy bruising or bleeding?" (e.g., leukemia, liver disease, recent chemotherapy)     plavix 8. MEDICINES: "Do you take any medicines which thin the blood such as: aspirin, heparin, ibuprofen (NSAIDS), Plavix, or Coumadin?"     Plavix 9. OTHER SYMPTOMS: "Do you have any other symptoms?" (e.g., headache, dizziness, vomiting, sore throat, joint pain, bleeding)     Bilateral feet swelling  Protocols used: Rash - Purple Spots or Dots-A-AH

## 2024-02-12 NOTE — Assessment & Plan Note (Signed)
 Symptoms suggestive of an allergic reaction to Plavix.   Advised patient to stop Plavix for one day and contact cardiology asap.   Rx sent for prednisone 20mg  x 2, 10 mg x 2, 10 x1.  Rx sent for hydroxyzine to help with itching. Discussed side effects with patient, daughter and son.   Discussed to schedule f/u with pcp if symptoms worsen or fail to improve. I will also send a message to his cardiologist.

## 2024-02-12 NOTE — Telephone Encounter (Signed)
 I would skip one dose of plavix and see if patient can get seen today or tomorrow in clinic.  Thanks.

## 2024-02-13 NOTE — Telephone Encounter (Signed)
 I thank all involved.  I think that with the absence of airway symptoms (ie no lip or tongue swelling), then ht makes sense to continue with plavix and prednisone in the meantime.  That is the best option I can think of at this point.  I appreciate everyone's help.     I'll ask the patient to update [place] if his rash is worse in the meantime.  Thanks.     AV- please check with patient on Wednesday and see about his status re: symptoms/rash since he has been taking prednisone.  Thanks.

## 2024-02-13 NOTE — Telephone Encounter (Signed)
 Noted.

## 2024-02-13 NOTE — Telephone Encounter (Signed)
 We would like him to try to stay on clopidogrel through 30 days from the time of his stent procedure if possible. We don't have any other good medication options for him at this time. Thanks

## 2024-02-13 NOTE — Telephone Encounter (Signed)
 Spoke with patient and he states he will start back taking it.

## 2024-02-14 ENCOUNTER — Other Ambulatory Visit: Payer: Self-pay

## 2024-02-14 NOTE — Telephone Encounter (Signed)
 Reached out to the patient and he only took the prednisone for 2  days because it increased  his blood sugar up. So he stopped the prednisone. But he is taking the plavix because Dr. Excell Seltzer told him he can not stop it for 30 days. He says the rash has gone down considerably. No itching.

## 2024-02-14 NOTE — Patient Instructions (Signed)
 Visit Information  Thank you for taking time to visit with me today. Please don't hesitate to contact me if I can be of assistance to you before our next scheduled telephone appointment.  Our next appointment is by telephone on Wednesday April 9th at 1:15pm  Following is a copy of your care plan:   Goals Addressed             This Visit's Progress    TOC Care Plan       Current Barriers: (reviewed 02/14/24) Knowledge Deficits related to plan of care for management of CHF, DMII, and Hypoglycemia associated with diabetes  and Atrial fib  RNCM Clinical Goal(s): (reviewed 02/14/24) Patient will work with the Care Management team over the next 30 days to address Transition of Care Barriers: Medication access Medication Management Diet/Nutrition/Food Resources Support at home Provider appointments Home Health services verbalize understanding of plan for management of CHF and DMII as evidenced by No Hospitalizations in the next 30 days take all medications exactly as prescribed and will call provider for medication related questions as evidenced by Electronic Medical Record attend all scheduled medical appointments: PCP and Specialists as evidenced by Electronic Medical Record  through collaboration with RN Care manager, provider, and care team.   Interventions: (reviewed 02/14/24) Evaluation of current treatment plan related to  self management and patient's adherence to plan as established by provider Weigh daily, first thing in the morning Report a weight gain of more than 2 pounds in one day or 5 pounds in one week Record blood glucose twice daily Report blood glucose to the provider of greater than 300 Report excessive bleeding from gums, cuts, and brusises Avoid straight razors Follow diet approved by the provider  Transitions of Care:  New goal. (reviewed 02/14/24) Doctor Visits  - discussed the importance of doctor visits Contacted provider for patient needs   Arranged PCP  follow-up within 7 days (Care Guide Scheduled) 02/14/24 - Provider discontinued the Brilinta and started the patient on Plavix  Patient Goals/Self-Care Activities: (reviewed 02/14/24) Participate in Transition of Care Program/Attend [facility] scheduled calls Notify RN Care Manager of [facility] call rescheduling needs Take all medications as prescribed Attend all scheduled provider appointments Call pharmacy for medication refills 3-7 days in advance of running out of medications Perform all self care activities independently  Call provider office for new concerns or questions   Follow Up Plan:  Telephone Follow up Wednesday April 9th at 1:15pm          The patient has been provided with contact information for the care management team and has been advised to call with any health related questions or concerns.   Please call the care guide team at [phone] if you need to cancel or reschedule your appointment.   Please call the Suicide and Crisis Lifeline: 988 call the [place] National Suicide Prevention Lifeline: (947)783-7760 or TTY: 630-220-0760 TTY 217-016-4833) to talk to a trained counselor if you are experiencing a Mental Health or Behavioral Health Crisis or need someone to talk to.  Deidre Ala, [name], RN [place]  VBCI - [employer] [facility] RN Care Manager [phone]

## 2024-02-14 NOTE — Patient Outreach (Signed)
 Care Management  Transitions of Care Program Transitions of Care Post-discharge week 2   02/14/2024 Name: [name] MRN: [id number] DOB: 18-Oct-1932  Subjective: [name] is a 89 y.o. year old male who is a primary care patient of Joaquim Nam, MD. The Care Management team Engaged with patient Engaged with patient by telephone to assess and address transitions of care needs.   Consent to Services:  Patient was given information about care management services, agreed to services, and gave verbal consent to participate.   Assessment: TOC Outreach completed to the patient today. Since the last Outreach the patient has been to see his PCP. His Brilinta was stopped and he was placed on Plavix. After two days of Plavix a rash on his lower edema became more irritated and he was told to stop Plavix and was placed on Prednisone. The prednisone caused his blood sugar to escalate to 282 so the patient stopped his Prednisone and he restarted the Plavix on 02/13/24. The patient states his weight today is 156.6. He has been working with HHPT and states he is slowly gaining back strength. He follows up with Cardiology on 02/23/24.     SDOH Interventions    Flowsheet Row Patient Outreach from 02/14/2024 in [place] [facility] DEPARTMENT Telephone from 02/06/2024 in [place] POPULATION HEALTH DEPARTMENT Office Visit from 01/09/2024 in [facility] [place] [facility] Clinical Support from 10/31/2023 in [facility] [place] [facility] Office Visit from 04/13/2023 in [facility] [place] [facility] [place] Telephone from 12/09/2022 in [facility] Care Coordination  SDOH Interventions        Food Insecurity Interventions Intervention Not Indicated Intervention Not Indicated -- Intervention Not Indicated -- Intervention Not Indicated  Housing Interventions Intervention Not Indicated Intervention Not Indicated -- Intervention Not Indicated --  --  Transportation Interventions Intervention Not Indicated Intervention Not Indicated, Patient Resources Dietitian) -- Patient Resources Dietitian) -- Intervention Not Indicated  Utilities Interventions Intervention Not Indicated Intervention Not Indicated -- Intervention Not Indicated -- --  Alcohol Usage Interventions -- -- -- Intervention Not Indicated (Score <7) -- --  Depression Interventions/Treatment  -- -- Counseling -- Counseling --  Financial Strain Interventions -- -- -- Intervention Not Indicated -- --  Physical Activity Interventions -- -- -- Intervention Not Indicated -- --  Stress Interventions -- -- -- Intervention Not Indicated -- --  Social Connections Interventions -- -- -- Intervention Not Indicated -- --  Health Literacy Interventions -- -- -- Intervention Not Indicated -- --        Goals Addressed             This Visit's Progress    TOC Care Plan       Current Barriers: (reviewed 02/14/24) Knowledge Deficits related to plan of care for management of CHF, DMII, and Hypoglycemia associated with diabetes  and Atrial fib  RNCM Clinical Goal(s): (reviewed 02/14/24) Patient will work with the Care Management team over the next 30 days to address Transition of Care Barriers: Medication access Medication Management Diet/Nutrition/Food Resources Support at home Provider appointments Home Health services verbalize understanding of plan for management of CHF and DMII as evidenced by No Hospitalizations in the next 30 days take all medications exactly as prescribed and will call provider for medication related questions as evidenced by Electronic Medical Record attend all scheduled medical appointments: PCP and Specialists as evidenced by Electronic Medical Record  through collaboration with RN Care manager, provider, and  care team.   Interventions: (reviewed 02/14/24) Evaluation of current treatment plan related to  self management and patient's adherence to  plan as established by provider Weigh daily, first thing in the morning Report a weight gain of more than 2 pounds in one day or 5 pounds in one week Record blood glucose twice daily Report blood glucose to the provider of greater than 300 Report excessive bleeding from gums, cuts, and brusises Avoid straight razors Follow diet approved by the provider  Transitions of Care:  New goal. (reviewed 02/14/24) Doctor Visits  - discussed the importance of doctor visits Contacted provider for patient needs   Arranged PCP follow-up within 7 days (Care Guide Scheduled) 02/14/24 - Provider discontinued the Brilinta and started the patient on Plavix  Patient Goals/Self-Care Activities: (reviewed 02/14/24) Participate in Transition of Care Program/Attend [facility] scheduled calls Notify RN Care Manager of [facility] call rescheduling needs Take all medications as prescribed Attend all scheduled provider appointments Call pharmacy for medication refills 3-7 days in advance of running out of medications Perform all self care activities independently  Call provider office for new concerns or questions   Follow Up Plan:  Telephone Follow up Wednesday April 9th at 1:15pm         Plan: The patient has been provided with contact information for the care management team and has been advised to call with any health related questions or concerns.   Routine follow-up and on-going assessment evaluation and education of disease processes, and recommended interventions for both chronic and acute medical conditions, will occur during each weekly visit during [facility] 30-day Program Outreach calls along with ongoing review of symptoms, medication reviews and reconciliation. Any updates, inconsistencies, discrepancies or acute care concerns will be addressed on the Care Plan and routed to the correct Practitioner if indicated.    The patient has been provided with contact information for the care management team and has been  advised to call with any health-related questions or concerns. The patient verbalized understanding with current POC. The patient is directed to their insurance card regarding availability of benefits coverage.  Deidre Ala, [name], RN [place]  VBCI - [employer] [facility] RN Care Manager [phone]

## 2024-02-14 NOTE — Telephone Encounter (Signed)
 Noted. Thanks.   Glad the rash is better.    If he has worsening symptoms, he could try taking a lower dose of prednisone.  He could use the pills/pack that he has but only take 1-2 tabs per day as needed.  That shouldn't increase his sugar as much.  Please update [place] as needed.

## 2024-02-15 ENCOUNTER — Other Ambulatory Visit (INDEPENDENT_AMBULATORY_CARE_PROVIDER_SITE_OTHER)

## 2024-02-15 DIAGNOSIS — I5020 Unspecified systolic (congestive) heart failure: Secondary | ICD-10-CM | POA: Diagnosis not present

## 2024-02-16 ENCOUNTER — Telehealth: Payer: Self-pay | Admitting: *Deleted

## 2024-02-16 DIAGNOSIS — N184 Chronic kidney disease, stage 4 (severe): Secondary | ICD-10-CM

## 2024-02-16 LAB — BASIC METABOLIC PANEL WITH GFR
BUN: 47 mg/dL — ABNORMAL HIGH (ref 6–23)
CO2: 31 meq/L (ref 19–32)
Calcium: 9.8 mg/dL (ref 8.4–10.5)
Chloride: 92 meq/L — ABNORMAL LOW (ref 96–112)
Creatinine, Ser: 3.48 mg/dL — ABNORMAL HIGH (ref 0.40–1.50)
GFR: 14.89 mL/min — CL (ref >60.00)
Glucose, Bld: 236 mg/dL — ABNORMAL HIGH (ref 70–99)
Potassium: 4.8 meq/L (ref 3.5–5.1)
Sodium: 135 meq/L (ref 135–145)

## 2024-02-16 MED ORDER — TORSEMIDE 20 MG PO TABS: ORAL_TABLET | ORAL | Status: DC

## 2024-02-16 NOTE — Telephone Encounter (Signed)
 Labs have been forward to Dr. Allena Katz.  I did speak with patient and reviewed him holding torsemide and when to restart. Patient verbalized understanding. He is aware that if he experiences any SOB to go to the ER. Patient states that he has not experienced any but he will go if any develops.  Lab appointment is setup for patient.

## 2024-02-16 NOTE — Telephone Encounter (Signed)
 Thanks

## 2024-02-16 NOTE — Telephone Encounter (Signed)
 Saa with [facility] lab called with Critical labs.  Pt's GFR is 14.89  Results given to PCP and phone note sent

## 2024-02-16 NOTE — Telephone Encounter (Signed)
 Please call and send a copy of the labs to renal clinic.  I need input from Dr. Allena Katz about his meds and Cr.    Please call pt.  His Cr is worse in the meantime.  I would hold torsemide today and Saturday and restart with 1 tab a day if needed for swelling/increased weight after that, ie Sunday if needed.   If more SOB, then needs ER eval.   I want to recheck Cr here early next week if he can't get seen in the meantime with renal.   Thanks.

## 2024-02-20 ENCOUNTER — Telehealth: Payer: Self-pay | Admitting: Family Medicine

## 2024-02-20 ENCOUNTER — Other Ambulatory Visit (INDEPENDENT_AMBULATORY_CARE_PROVIDER_SITE_OTHER)

## 2024-02-20 DIAGNOSIS — I5023 Acute on chronic systolic (congestive) heart failure: Secondary | ICD-10-CM | POA: Diagnosis not present

## 2024-02-20 DIAGNOSIS — D631 Anemia in chronic kidney disease: Secondary | ICD-10-CM | POA: Diagnosis not present

## 2024-02-20 DIAGNOSIS — E1122 Type 2 diabetes mellitus with diabetic chronic kidney disease: Secondary | ICD-10-CM | POA: Diagnosis not present

## 2024-02-20 DIAGNOSIS — N184 Chronic kidney disease, stage 4 (severe): Secondary | ICD-10-CM | POA: Diagnosis not present

## 2024-02-20 DIAGNOSIS — H9190 Unspecified hearing loss, unspecified ear: Secondary | ICD-10-CM

## 2024-02-20 DIAGNOSIS — I5030 Unspecified diastolic (congestive) heart failure: Secondary | ICD-10-CM | POA: Diagnosis not present

## 2024-02-20 DIAGNOSIS — I130 Hypertensive heart and chronic kidney disease with heart failure and stage 1 through stage 4 chronic kidney disease, or unspecified chronic kidney disease: Secondary | ICD-10-CM | POA: Diagnosis not present

## 2024-02-20 LAB — BASIC METABOLIC PANEL WITH GFR
BUN: 37 mg/dL — ABNORMAL HIGH (ref 6–23)
CO2: 29 meq/L (ref 19–32)
Calcium: 9.2 mg/dL (ref 8.4–10.5)
Chloride: 97 meq/L (ref 96–112)
Creatinine, Ser: 2.98 mg/dL — ABNORMAL HIGH (ref 0.40–1.50)
GFR: 17.94 mL/min — ABNORMAL LOW (ref >60.00)
Glucose, Bld: 250 mg/dL — ABNORMAL HIGH (ref 70–99)
Potassium: 4.2 meq/L (ref 3.5–5.1)
Sodium: 137 meq/L (ref 135–145)

## 2024-02-20 NOTE — Telephone Encounter (Signed)
 Copied from CRM [id number]. Topic: Referral - Request for Referral >> Feb 20, 2024  1:34 PM [place] E wrote: Did the patient discuss referral with their provider in the last year? Yes (If No - schedule appointment) (If Yes - send message)  Appointment offered? No  Type of order/referral and detailed reason for visit: ENT referral, patient's daughter stated that the patient is having hearing loss and wanted to get him set up with a better set of hearing aides.  Preference of office, provider, location: Dr. Suzanna Obey, ENT Specialist  If referral order, have you been seen by this specialty before? No (If Yes, this issue or another issue? When? Where?  Can we respond through MyChart? No

## 2024-02-21 ENCOUNTER — Other Ambulatory Visit: Payer: Self-pay

## 2024-02-21 NOTE — Telephone Encounter (Signed)
 Referral placed  Thanks!

## 2024-02-21 NOTE — Patient Outreach (Signed)
 Transition of Care week 3  Visit Note  02/21/2024  Name: KARIN GRIFFITH MRN: [id number]          DOB: 25-Feb-1932  Situation: Patient enrolled in [facility] 30-day program. Visit completed with Midge Minium by telephone.   Background:    Past Medical History:  Diagnosis Date   Age-related macular degeneration, wet, both eyes ([name])    Anemia    Secondary to acute blood loss   Anginal pain ([name])    last chest pain in Feb 2021   Arthritis    "mild in hands, knees, ankles" (11/13/2015)   Atrial fibrillation ([name])    Consideration was given for atrial flutter ablation, but patient developed atrial fibrillation. Cardioversion was done. Dr. Graciela Husbands decided to watch him clinically. November, 2011   Atrial flutter [facility]) 07/2010   September, 2011   Hospital with PNA and cath done.Marland KitchenMarland KitchenCoumadin.  Atrial flutter ablation planned, but  pt. then had atrial fibrillation,/outpatient conversion 09/08/10..NSR..plan to follow..Dr. Graciela Husbands   CAD (coronary artery disease)    Catheterization, September, 2011,  grafts patent from redo CABG,, medical therapy of coronary disease, consideration to proceeding with atrial flutter ablation   Carotid artery disease ([name])    Doppler 09/18/2009 - 49% bilateral stenoses   Carotid artery disease ([name])    49% bilateral, Doppler, November, 2010   CHF (congestive heart failure) ([name])    Chronic kidney disease (CKD), stage III (moderate) ([name])    Diabetic peripheral neuropathy ([name])    feet   Diverticulosis of colon with hemorrhage 2009   several unit diverticular bleed    Erectile dysfunction    Mild   Gout    Hearing loss    wears hearing aids   History of blood transfusion "several"   related to diverticular bleeding   Hyperlipidemia    Hypertension    Mitral regurgitation    Mild, echo, September, 2011   NSTEMI (non-ST elevated myocardial infarction) ([name]) 07/2010   at [facility] with repeat cath, rec medical mgmt    OSA on CPAP    uses CPAP nightly   Personal history of  colonic polyps    PNA (pneumonia) 07/2010   NSTEMI at [facility] with repeat cath, rec medical mgmt    RBBB (right bundle branch block)    S/P mitral valve repair 02/06/2020   s/p TEER with a single MitraClip NTW placed on A2/P2   Shoulder pain    "positional; better now" (11/13/2015)   Skin cancer    R lower leg, per derm 2012   Type II diabetes mellitus ([name])     Assessment:  TOC Outreach completed today. The patient states he was just getting out of the shower. He has been stable the last week. He has been having labs drawn weekly due to high GFR levels. He is asymptomatic. BP is 134/69 today, HR is 67. BS is 155 since discontinuation of Prednisone. He has not had further bleeding in the last two weeks. He continues to improve strength and work with HHPT. He is conscious about his weight and it was 159.6. He understands the importance of fluid balance. He has requested a referral to the ENT because he feels he has had a change in his hearing. RNCM to follow up next week.  Patient Reported Symptoms:  Cognitive @FLOW (205576::1))@  Neurological No symptoms reported    HEENT Change or loss of hearing    Cardiovascular Irregular pulse    Respiratory No symptoms reported    Endocrine No symptoms  reported    Gastrointestinal Constipation    Genitourinary No symptoms reported    Integumentary No symptoms reported    Musculoskeletal Unsteady gait, Weakness    Psychosocial No symptoms reported     Vitals:   02/21/24 1400  BP: 134/69  Pulse: 67    Medications Reviewed Today     Reviewed by Redge Gainer, RN (Case Manager) on 02/21/24 at 1350  Med List Status: <None>   Medication Order Taking? Sig Documenting Provider Last Dose Status Informant  acetaminophen (TYLENOL) 325 MG tablet [id number]  Take 2 tablets (650 mg total) by mouth every 6 (six) hours as needed for mild pain (pain score 1-3) (or Fever >/= 101). [address], [place], [place]  Active   allopurinol (ZYLOPRIM) 100 MG  tablet [id number]  TAKE ONE-HALF (1/2) TABLET EVERY MONDAY, [facility] AND FRIDAY Joaquim Nam, MD  Active Self, Pharmacy Records  Cholecalciferol (VITAMIN D) 1000 UNITS capsule [id number]  Take 1,000 Units by mouth at bedtime. [provider]  Active Self, Pharmacy Records  clopidogrel (PLAVIX) 75 MG tablet [id number]  take 1 tablet by mouth daily. Joaquim Nam, MD  Active   ELIQUIS 2.5 MG TABS tablet [id number]  TAKE 1 TABLET TWICE A DAY Runell Gess, MD  Active Self, Pharmacy Records  fish oil-omega-3 fatty acids 1000 MG capsule [id number]  Take 1 g by mouth at bedtime. [provider]  Active Self, Pharmacy Records  glucose blood (FREESTYLE LITE) test strip [id number]  USE TO TEST BLOOD SUGAR ONCE DAILY AND AS NEEDED Joaquim Nam, MD  Active Self, Pharmacy Records  hydrALAZINE (APRESOLINE) 25 MG tablet [id number]  Take 1 tablet (25 mg total) by mouth daily. Runell Gess, MD  Active Self, Pharmacy Records  HYDROcodone-acetaminophen (NORCO/VICODIN) 5-325 MG tablet [id number]  Take 0.5-1 tablets by mouth 2 (two) times daily as needed for moderate pain (pain score 4-6).  Patient not taking: Reported on 02/14/2024   Joaquim Nam, MD  Active Self, Pharmacy Records  hydrOXYzine (VISTARIL) 25 MG capsule [id number]  Take 1 capsule (25 mg total) by mouth every 8 (eight) hours as needed. [place] Charon, NP  Active   ipratropium (ATROVENT) 0.03 % nasal spray [id number]  USE 2 SPRAYS IN EACH NOSTRIL TWICE A DAY AS NEEDED FOR RHINITIS Joaquim Nam, MD  Active Self, Pharmacy Records  isosorbide mononitrate (IMDUR) 60 MG 24 hr tablet [id number]  Take 1.5 tablets (90 mg total) by mouth daily. Runell Gess, MD  Active Self, Pharmacy Records           Med Note Para March, Doreene Burke Feb 08, 2024 12:26 PM)    magnesium oxide (MAG-OX) 400 MG tablet [id number]  Take 1 tablet (400 mg total) by mouth every other day. Joaquim Nam, MD  Active Self, Pharmacy Records  metoprolol  succinate (TOPROL-XL) 100 MG 24 hr tablet [id number]  Take 1 tablet (100 mg total) by mouth daily. Take with or immediately following a meal. Sheikh, [place]ma[place] Latif, DO  Active   nitroGLYCERIN (NITROSTAT) 0.4 MG SL tablet [id number]  DISSOLVE 1 TABLET UNDER THE TONGUE EVERY 5 MINUTES AS NEEDED FOR CHEST PAIN FOR A MAXIMUM OF 3 DOSES Runell Gess, MD  Active Self, Pharmacy Records  polyethylene glycol (MIRALAX / GLYCOLAX) 17 g packet [id number]  Take 17 g by mouth daily. [provider]  Active Self, Pharmacy Records  Potassium Gluconate 595 MG CAPS [id number]  Take 595 mg by mouth in the morning. [provider]  Active Self, Pharmacy Records  predniSONE (DELTASONE) 10 MG tablet [id number]  20 mg x 2 days. 10 mg x 2 days. Then 10 mg for 1 day then stop.  Patient not taking: Reported on 02/14/2024   [place] Charon, NP  Consider Medication Status and Discontinue (Discontinued by provider)   ranolazine (RANEXA) 500 MG 12 hr tablet [id number]  TAKE 1 TABLET TWICE A DAY Runell Gess, MD  Active Self, Pharmacy Records  rosuvastatin (CRESTOR) 10 MG tablet [id number]  TAKE 1 TABLET DAILY Runell Gess, MD  Active   torsemide (DEMADEX) 20 MG tablet [id number]  Take 1 tab daily in the AM if weight is increasing. Joaquim Nam, MD  Active   Med List Note Azucena Fallen, CPhT 01/31/24 1615): [facility] is his preferred long term medication pharmacy            Recommendation:   Lab requests: per provider recommendation Referral to: ENT for evaluation and possibly new hearing aides  Continue with daily weights. Report a greater than 3 pound weight gain Continue with daily BP readings. Report BP of 150/90 or greater Continue with daily Glucose readings. Report a reading greater than 300 Continue exercise with HHPT Activity as tolerated  Follow Up Plan:   Telephone follow-up in 1 week  Deidre Ala, [name], RN [place]  VBCI - [facility] Health RN Care  Manager [phone]

## 2024-02-21 NOTE — Patient Instructions (Signed)
 Visit Information  Thank you for taking time to visit with me today. Please don't hesitate to contact me if I can be of assistance to you before our next scheduled telephone appointment.  Our next appointment is by telephone on Wednesday April 16th at 1:30pm  Following is a copy of your care plan:   Goals Addressed             This Visit's Progress    TOC Care Plan   On track    Current Barriers: (reviewed 02/21/24) Knowledge Deficits related to plan of care for management of CHF, DMII, and Hypoglycemia associated with diabetes  and Atrial fib  RNCM Clinical Goal(s): (reviewed 02/21/24) Patient will work with the Care Management team over the next 30 days to address Transition of Care Barriers: Medication access Medication Management Diet/Nutrition/Food Resources Support at home Provider appointments Home Health services verbalize understanding of plan for management of CHF and DMII as evidenced by No Hospitalizations in the next 30 days take all medications exactly as prescribed and will call provider for medication related questions as evidenced by Electronic Medical Record attend all scheduled medical appointments: PCP and Specialists as evidenced by Electronic Medical Record  through collaboration with RN Care manager, provider, and care team.   Interventions: (reviewed 02/21/24) Evaluation of current treatment plan related to  self management and patient's adherence to plan as established by provider Weigh daily, first thing in the morning Report a weight gain of more than 2 pounds in one day or 5 pounds in one week Record blood glucose twice daily Report blood glucose to the provider of greater than 300 Report excessive bleeding from gums, cuts, and brusises Avoid straight razors Follow diet approved by the provider  Transitions of Care:  Goal on track:  Yes. (reviewed 02/21/24) Doctor Visits  - discussed the importance of doctor visits Contacted provider for patient needs    Arranged PCP follow-up within 7 days (Care Guide Scheduled) 02/14/24 - Provider discontinued the Brilinta and started the patient on Plavix  Patient Goals/Self-Care Activities: (reviewed 02/21/24) Participate in Transition of Care Program/Attend [facility] scheduled calls Notify RN Care Manager of [facility] call rescheduling needs Take all medications as prescribed Attend all scheduled provider appointments Call pharmacy for medication refills 3-7 days in advance of running out of medications Perform all self care activities independently  Call provider office for new concerns or questions   Follow Up Plan:  Telephone Follow up Wednesday April 16th at 1:30pm         The patient verbalized understanding of instructions, educational materials, and care plan provided today and agreed to receive a mailed copy of patient instructions, educational materials, and care plan.   Telephone follow up appointment with care management team member scheduled for:  Please call the care guide team at [phone] if you need to cancel or reschedule your appointment.   Please call the Suicide and Crisis Lifeline: 988 call the [place] National Suicide Prevention Lifeline: 571-728-8156 or TTY: 437-125-6511 TTY 412-682-8343) to talk to a trained counselor if you are experiencing a Mental Health or Behavioral Health Crisis or need someone to talk to.  Deidre Ala, [name], RN [place]  VBCI - [employer] [facility] RN Care Manager [phone]

## 2024-02-21 NOTE — Telephone Encounter (Signed)
Patient notified that the referral has been placed

## 2024-02-21 NOTE — Addendum Note (Signed)
 Addended by: Joaquim Nam on: 02/21/2024 02:25 PM   Modules accepted: Orders

## 2024-02-23 ENCOUNTER — Encounter: Payer: Self-pay | Admitting: Cardiovascular Disease

## 2024-02-23 ENCOUNTER — Ambulatory Visit: Attending: Cardiology | Admitting: Cardiovascular Disease

## 2024-02-23 VITALS — BP 120/60 | HR 64 | Ht 67.0 in | Wt 161.6 lb

## 2024-02-23 DIAGNOSIS — I25118 Atherosclerotic heart disease of native coronary artery with other forms of angina pectoris: Secondary | ICD-10-CM | POA: Insufficient documentation

## 2024-02-23 DIAGNOSIS — N184 Chronic kidney disease, stage 4 (severe): Secondary | ICD-10-CM | POA: Insufficient documentation

## 2024-02-23 DIAGNOSIS — I5020 Unspecified systolic (congestive) heart failure: Secondary | ICD-10-CM | POA: Insufficient documentation

## 2024-02-23 DIAGNOSIS — I4821 Permanent atrial fibrillation: Secondary | ICD-10-CM | POA: Diagnosis not present

## 2024-02-23 DIAGNOSIS — I350 Nonrheumatic aortic (valve) stenosis: Secondary | ICD-10-CM | POA: Diagnosis not present

## 2024-02-23 NOTE — Assessment & Plan Note (Signed)
 Patient had acute kidney injury following a very low contrast load with cath and PCI.  Creatinine peaked at 3.5 and on follow-up a few days ago was back down to his baseline at 2.98.  He is back on torsemide at his home dose.  He has close follow-up scheduled with Dr. Allena Katz.

## 2024-02-23 NOTE — Assessment & Plan Note (Signed)
 Recent saphenous vein graft PCI.  Patient tolerating clopidogrel.  Initially he had a rash and was concerned about a drug reaction, but this has resolved.  Hopefully can tolerate clopidogrel for at least 3 months then it would be reasonable to treat him with apixaban alone to minimize his bleeding risk.  Will defer to Dr. Hazle Coca management.  For now he will continue on his current regimen.  He reports no anginal symptoms today.  I'd be happy to see the patient back in the future as needed.  Favor conservative medical therapy for his heart disease moving forward.  I think he would be at high risk of any intervention due to factors outlined above.

## 2024-02-23 NOTE — Assessment & Plan Note (Signed)
 Patient with severe LV dysfunction and chronic heart failure symptoms, NYHA functional class III.  He has no resting symptoms and specifically denies orthopnea or PND.  Medical therapy is limited by his advanced kidney disease.  He continues on torsemide, metoprolol succinate.

## 2024-02-23 NOTE — Assessment & Plan Note (Signed)
 The patient's prior echo study suggested severe low-flow low gradient aortic stenosis, but his mean gradient was only 12 mmHg.  He underwent consultation for TAVR and was referred for right and left heart catheterization.  His heart catheterization performed 2 weeks ago demonstrated a transvalvular gradient of only 6 mmHg and calculated aortic valve area of 2.3 cm.  I do not think he would benefit from TAVR and have recommended ongoing medical therapy.  In addition, the risks associated with proceeding with CT angiography studies and the TAVR procedure would be significant in the setting of his advanced age, severely reduced functional capacity, and progressive kidney disease.  Plan to continue with medical therapy.  Advised him that even if he has progressive aortic stenosis in the next year or 2, I would favor a conservative approach and would not recommend proceeding with TAVR.

## 2024-02-23 NOTE — Patient Instructions (Signed)
 Medication Instructions:  Your physician recommends that you continue on your current medications as directed. Please refer to the Current Medication list given to you today.  *If you need a refill on your cardiac medications before your next appointment, please call your pharmacy*  Follow-Up: At [facility], you and your health needs are our priority.  As part of our continuing mission to provide you with exceptional heart care, we have created designated Provider Care Teams.  These Care Teams include your primary Cardiologist (physician) and Advanced Practice Providers (APPs -  Physician Assistants and Nurse Practitioners) who all work together to provide you with the care you need, when you need it.  Your next appointment:   As needed  The format for your next appointment:   In Person  Provider:   Tonny Bollman, MD  Other Instructions   1st Floor: - Lobby - Registration  - Pharmacy  - Lab - Cafe  2nd Floor: - PV Lab - Diagnostic Testing (echo, CT, nuclear med)  3rd Floor: - Vacant  4th Floor: - TCTS (cardiothoracic surgery) - AFib Clinic - Structural Heart Clinic - Vascular Surgery  - Vascular Ultrasound  5th Floor: - [facility] (general and EP) - Clinical Pharmacy for coumadin, hypertension, lipid, weight-loss medications, and med management appointments    Valet parking services will be available as well.

## 2024-02-23 NOTE — Assessment & Plan Note (Signed)
He continues on apixaban

## 2024-02-23 NOTE — Progress Notes (Signed)
 Cardiology Office Note:    Date:  02/23/2024   ID:  Rakan, Soffer 1931/11/29, MRN [id number]  PCP:  Joaquim Nam, MD   [facility] Providers Cardiologist:  Nanetta Batty, MD     Referring MD: Joaquim Nam, MD   No chief complaint on file.   History of Present Illness:    [name] is a 89 y.o. male with a hx of:  Coronary artery disease S/p CABG in 1996 and redo in 2006 S/p PCI to S-OM in 05/2014 (4 x 16 mm DES, 4 x 24 mm DES) Plavix non-responder Myoview 07/12/18: EF 39, inf/inf-lat infarct w minimal peri-infarct ischemia  LHC 08/02/18: RCA ost 100, LAD ost 100, LCx ost 70, OM1 90, 60; S-RCA 100 w L-R collats, S-OM 100, S-D1 patent, L-LAD patent; PCI of LCx not successful - Med Rx  Saphenous vein graft PCI March 2025 (HFrEF) heart failure with reduced ejection fraction  TTE 02/2020: EF 45-50 TTE 01/2021: EF ~50 TTE 11/24/22: EF 35-40, inf/inf-sept/septal HK, mild to mod LVH, mildly reduced RVSF, severe pulmonary HTN, RVSP 60.7, mod LAE, mild MR, s/p MitrClip, no MS, mod TR, AV calcified w mean 8.2 mmHg, RAP 3, small ASD w L-R shunt Permanent atrial fibrillation - not on OAC because of bleeding Mitral regurgitation  S/p MitraClip 01/2020 - mild residual MR Carotid artery disease S/p R CEA in 1994 Carotid US 01/09/23: R 1-39; L 40-59  Chronic kidney disease Stage 3B Hypertension  Hyperlipidemia  Hx of GI bleed  S/p L sided hemicolectomy and sigmoidectomy OSA  Moderate low-flow low gradient aortic stenosis (stage D2)  The patient is here with his daughter today.  He has become increasingly weak.  He has a difficult time going from sitting to standing now.  Once he is up on his feet, he is able to ambulate with a walker and he does pretty well with that.  He complains of shortness of breath and generalized weakness with any physical activity.  He denies orthopnea, PND, chest pain, or chest pressure.  Patient was recently hospitalized with hypoglycemia.   That admission he was noted to have some signs of worsening heart failure symptoms.  He had been seen for TAVR evaluation because of concerns about low-flow low gradient aortic stenosis.  We decided to move forward with his right and left heart catheterization while he was hospitalized in order to optimize his renal function in the setting of chronic stage IV kidney disease.  He underwent cardiac catheterization showing severe multivessel native CAD, continued patency of his LIMA to LAD and severe stenosis of the saphenous vein graft to diagonal treated with 3.5 x 12 mm Synergy DES.  His mean transaortic gradient was only 6 mmHg.  He was felt to be most appropriate for medical therapy for his heart failure and aortic stenosis.  He did not have signs of severe low-flow low gradient aortic stenosis by invasive assessment.  He had a drug rash initially and was concerned about clopidogrel, but he has tolerated this after a course of prednisone was administered and has had no further problems.  He remains on apixaban and clopidogrel for anticoagulation and antiplatelet therapy.  Denies any significant bleeding problems.  He is about to celebrate his 89th birthday and his grandchildren and great grandson are coming in for his celebration.  Current Medications: Current Meds  Medication Sig   acetaminophen (TYLENOL) 325 MG tablet Take 2 tablets (650 mg total) by mouth every 6 (six)  hours as needed for mild pain (pain score 1-3) (or Fever >/= 101).   allopurinol (ZYLOPRIM) 100 MG tablet TAKE ONE-HALF (1/2) TABLET EVERY MONDAY, WEDNESDAY AND FRIDAY   Cholecalciferol (VITAMIN D) 1000 UNITS capsule Take 1,000 Units by mouth at bedtime.   clopidogrel (PLAVIX) 75 MG tablet take 1 tablet by mouth daily.   ELIQUIS 2.5 MG TABS tablet TAKE 1 TABLET TWICE A DAY   fish oil-omega-3 fatty acids 1000 MG capsule Take 1 g by mouth at bedtime.   glucose blood (FREESTYLE LITE) test strip USE TO TEST BLOOD SUGAR ONCE DAILY AND AS  NEEDED   hydrALAZINE (APRESOLINE) 25 MG tablet Take 1 tablet (25 mg total) by mouth daily.   HYDROcodone-acetaminophen (NORCO/VICODIN) 5-325 MG tablet Take 0.5-1 tablets by mouth 2 (two) times daily as needed for moderate pain (pain score 4-6).   hydrOXYzine (VISTARIL) 25 MG capsule Take 1 capsule (25 mg total) by mouth every 8 (eight) hours as needed.   ipratropium (ATROVENT) 0.03 % nasal spray USE 2 SPRAYS IN EACH NOSTRIL TWICE A DAY AS NEEDED FOR RHINITIS   isosorbide mononitrate (IMDUR) 60 MG 24 hr tablet Take 1.5 tablets (90 mg total) by mouth daily.   magnesium oxide (MAG-OX) 400 MG tablet Take 1 tablet (400 mg total) by mouth every other day.   metoprolol succinate (TOPROL-XL) 100 MG 24 hr tablet Take 1 tablet (100 mg total) by mouth daily. Take with or immediately following a meal.   nitroGLYCERIN (NITROSTAT) 0.4 MG SL tablet DISSOLVE 1 TABLET UNDER THE TONGUE EVERY 5 MINUTES AS NEEDED FOR CHEST PAIN FOR A MAXIMUM OF 3 DOSES   polyethylene glycol (MIRALAX / GLYCOLAX) 17 g packet Take 17 g by mouth daily.   Potassium Gluconate 595 MG CAPS Take 595 mg by mouth in the morning.   ranolazine (RANEXA) 500 MG 12 hr tablet TAKE 1 TABLET TWICE A DAY   rosuvastatin (CRESTOR) 10 MG tablet TAKE 1 TABLET DAILY   torsemide (DEMADEX) 20 MG tablet Take 1 tab daily in the AM if weight is increasing.   [DISCONTINUED] predniSONE (DELTASONE) 10 MG tablet 20 mg x 2 days. 10 mg x 2 days. Then 10 mg for 1 day then stop.     Allergies:   Brilinta [ticagrelor] and Nsaids   ROS:   Please see the history of present illness.    All other systems reviewed and are negative.  EKGs/Labs/Other Studies Reviewed:    The following studies were reviewed today: Cardiac Studies & Procedures   ______________________________________________________________________________________________ CARDIAC CATHETERIZATION  CARDIAC CATHETERIZATION 02/02/2024  Narrative 1.  Severe native vessel coronary artery disease with total  occlusion of the proximal LAD, total occlusion of the ostial RCA, and stable severe stenosis of the circumflex 2.  Status post CABG with continued patency of the LIMA to LAD and patency of the SVG to diagonal with type stenosis in the proximal body of the graft at the edge of the previously stented segment, treated with a 3.5 x 12 mm Synergy DES 3.  Normal left heart diastolic filling pressures with a pulmonary wedge pressure of 18 and an LVEDP of 15 mmHg 4.  Moderately elevated right heart pressures with a mean right atrial pressure of 12 mmHg and a pulmonary artery pressure of 55/17 mean 31 mmHg, transpulmonary gradient 13 mmHg, PVR 2.4 Wood units. 5.  Mild aortic stenosis by direct catheter measurement with a mean transvalvular gradient of 6 mmHg and calculated valve area of 2.3 cm  Recommendations: Patient now  status post saphenous vein graft intervention on a background of permanent atrial fibrillation.  Recommend that he resume apixaban 2.5 mg twice daily tomorrow morning.  Recommend aspirin 81 mg x 30 days then stop.  Recommend clopidogrel 75 mg daily x 6 months as tolerated.  I suspect the patient has moderate aortic stenosis and I would favor ongoing observation and medical therapy considering his advanced age and significant comorbidities.  I am not convinced that proceeding with TAVR would benefit him and I think it would expose him to significant risk of renal failure as he would still require CT angiography studies and the TAVR procedure.  Will review his case with our multidisciplinary heart valve team, but he can be discharged tomorrow from the hospital as long as his kidney function is stable.  He received 35 cc of contrast for today's procedure.  Findings Coronary Findings Diagnostic  Dominance: Right  Left Anterior Descending Ost LAD to Prox LAD lesion is 100% stenosed.  Left Circumflex Ost Cx lesion is 70% stenosed. Prox Cx lesion is 60% stenosed.  First Obtuse Marginal  Branch Ost 1st Mrg lesion is 90% stenosed. The lesion is calcified.  Second Obtuse Marginal Branch The circumflex is patent.  The vessel has severe calcific bifurcation disease involving the first OM and AV circumflex.  This area has previously been attempted to intervened on without success.  It appears angiographically stable from the previous study in 2019.  Right Coronary Artery The native RCA is occluded at the ostium Ost RCA to Prox RCA lesion is 100% stenosed.  Right Posterior Atrioventricular Artery Collaterals RPAV filled by collaterals from Mid LAD.  Graft To Dist RCA The SVG to PDA is known to be occluded.  Is not selectively injected. Origin to Prox Graft lesion is 100% stenosed.  Graft To 1st Diag Non-stenotic Origin to Mid Graft lesion was previously treated. Mid Graft lesion is 90% stenosed.  LIMA Graft To Mid LAD The LIMA to LAD is imaged subselectively from the left subclavian artery.  The graft is patent and unchanged from the previous study.  Intervention  Mid Graft lesion (Graft To 1st Diag) Stent A drug-eluting stent was successfully placed using a SYNERGY XD 3.50X12. Post-Intervention Lesion Assessment The intervention was successful. Pre-interventional TIMI flow is 3. Post-intervention TIMI flow is 3. No complications occurred at this lesion. There is a 0% residual stenosis post intervention.   CARDIAC CATHETERIZATION  CARDIAC CATHETERIZATION 02/06/2020  Narrative Successful transcatheter edge-to-edge mitral valve repair using a single MitraClip NTW device placed A2/P2, reducing mitral regurgitation from 4+ to 1+   STRESS TESTS  MYOCARDIAL PERFUSION IMAGING 07/12/2018  Narrative  Nuclear stress EF: 39%.  The left ventricular ejection fraction is moderately decreased (30-44%).  There was no ST segment deviation noted during stress.  Findings consistent with prior myocardial infarction.  This is an intermediate risk study.  1. EF 39%,  inferior hypokinesis. 2. Fixed medium-sized, severe basal to apical inferior and inferolateral perfusion defect.  This suggests prior infarction with minimal ischemia. 3. Atrial fibrillation noted.  Intermediate risk study due to low EF.   ECHOCARDIOGRAM  ECHOCARDIOGRAM COMPLETE 12/13/2023  Narrative ECHOCARDIOGRAM REPORT    Patient Name:   [name] Date of Exam: 12/13/2023 Medical Rec #:  [id number]      Height:       66.0 in Accession #:    [id number]     Weight:       170.2 lb Date of Birth:  [birth year]  BSA:          1.867 m Patient Age:    89 years       BP:           118/62 mmHg Patient Gender: M              HR:           101 bpm. Exam Location:  [address]  Procedure: 2D Echo, Cardiac Doppler, Color Doppler and Intracardiac Opacification Agent  Indications:    R06.02 SOB  History:        Patient has prior history of Echocardiogram examinations, most recent 11/24/2022. CAD and Previous Myocardial Infarction, Arrythmias:Atrial Fibrillation; Risk Factors:Diabetes, Hypertension and Dyslipidemia.  Mitral Valve: single MitraClip NTW placed on A2/P2 Mitra-Clip valve is present in the mitral position. Procedure Date: 02/06/2020.  Sonographer:    Thurman Coyer RDCS Referring Phys: [id number] Curd DUNCAN  IMPRESSIONS   1. Left ventricular ejection fraction, by estimation, is 30 to 35%. The left ventricle has moderately decreased function. The left ventricle demonstrates global hypokinesis. There is moderate concentric left ventricular hypertrophy. Left ventricular diastolic function could not be evaluated. 2. Right ventricular systolic function is moderately reduced. The right ventricular size is normal. There is moderately elevated pulmonary artery systolic pressure. The estimated right ventricular systolic pressure is 54.7 mmHg. 3. The mitral valve has been repaired/replaced. There is a single MitraClip NTW placed on A2/P2 Mitra-Clip present in the mitral  position. Procedure Date: 02/06/2020. The mitra Clip is well seated. The mean MVG is consistent with mild mitral stenosis. Mild mitral regurgitation is present. 4. The aortic valve is calcified. There is severe calcifcation of the aortic valve. There is severe thickening of the aortic valve. Aortic valve regurgitation is mild. Aortic valve mean gradient measures 12.0 mmHg. Aortic valve Vmax measures 2.42 m/s. low DVI 0.22 and SVI 20. Despite a low AVG and Vmax, all findings suggest at least moderate and likely moderate to severe low flow low gradient aortic stenosis in setting of HFrEF. 5. The inferior vena cava is dilated in size with <50% respiratory variability, suggesting right atrial pressure of 15 mmHg. 6. Ascending aorta measurements are within normal limits for age when indexed to body surface area. 7. Evidence of residual atrial level shunting detected by color flow Doppler from prior transseptal puncture site . 8. Compared to prior study dated 11/24/22, the mean AVG has increased from to , Vmax has icnreased from 1.56m/s to 2.55m/s, DVI has decreased from 0.34 to 0.22. Now at least moderate and likely moderate to severe low flow low gradient Aortic stenosis. Stable Mitra-Clip with minimal increase in mean MVG from 4 to .  FINDINGS Left Ventricle: Left ventricular ejection fraction, by estimation, is 30 to 35%. The left ventricle has moderately decreased function. The left ventricle demonstrates global hypokinesis. The left ventricular internal cavity size was normal in size. There is moderate concentric left ventricular hypertrophy. Abnormal (paradoxical) septal motion, consistent with left bundle branch block. Left ventricular diastolic function could not be evaluated due to mitral valve repair. Left ventricular diastolic function could not be evaluated.  Right Ventricle: The right ventricular size is normal. No increase in right ventricular wall thickness. Right  ventricular systolic function is moderately reduced. There is moderately elevated pulmonary artery systolic pressure. The tricuspid regurgitant velocity is 3.15 m/s, and with an assumed right atrial pressure of 15 mmHg, the estimated right ventricular systolic pressure is 54.7 mmHg.  Left Atrium: Left atrial size  was normal in size.  Right Atrium: Right atrial size was normal in size.  Pericardium: There is no evidence of pericardial effusion.  Mitral Valve: The mitral valve has been repaired/replaced. Moderate mitral annular calcification. Mild mitral valve regurgitation. There is a single MitraClip NTW placed on A2/P2 Mitra-Clip present in the mitral position. Procedure Date: 02/06/2020. Moderate to severe mitral valve stenosis. MV peak gradient, 13.0 mmHg. The mean mitral valve gradient is 5.0 mmHg.  Tricuspid Valve: The tricuspid valve is normal in structure. Tricuspid valve regurgitation is mild . No evidence of tricuspid stenosis.  Aortic Valve: Planimetered AVA is 1cm2. The aortic valve is calcified. There is severe calcifcation of the aortic valve. There is severe thickening of the aortic valve. Aortic valve regurgitation is mild. Moderate aortic stenosis is present. Aortic valve mean gradient measures 12.0 mmHg. Aortic valve peak gradient measures 23.4 mmHg. Aortic valve area, by VTI measures 0.68 cm.  Pulmonic Valve: The pulmonic valve was normal in structure. Pulmonic valve regurgitation is mild. No evidence of pulmonic stenosis.  Aorta: The aortic root is normal in size and structure. Ascending aorta measurements are within normal limits for age when indexed to body surface area.  Venous: The inferior vena cava is dilated in size with less than 50% respiratory variability, suggesting right atrial pressure of 15 mmHg.  IAS/Shunts: Evidence of atrial level shunting detected by color flow Doppler.   LEFT VENTRICLE PLAX 2D LVIDd:         4.30 cm LVIDs:         3.70 cm LV PW:          1.30 cm LV IVS:        1.60 cm LVOT diam:     2.00 cm LV SV:         37 LV SV Index:   20 LVOT Area:     3.14 cm   RIGHT VENTRICLE RV Basal diam:  3.60 cm RV Mid diam:    2.80 cm RV S prime:     6.37 cm/s  LEFT ATRIUM             Index        RIGHT ATRIUM           Index LA diam:        4.40 cm 2.36 cm/m   RA Area:     21.40 cm LA Vol (A2C):   86.9 ml 46.54 ml/m  RA Volume:   52.70 ml  28.22 ml/m LA Vol (A4C):   27.7 ml 14.83 ml/m LA Biplane Vol: 50.7 ml 27.15 ml/m AORTIC VALVE AV Area (Vmax):    0.96 cm AV Area (Vmean):   0.97 cm AV Area (VTI):     0.68 cm AV Vmax:           242.00 cm/s AV Vmean:          160.000 cm/s AV VTI:            0.549 m AV Peak Grad:      23.4 mmHg AV Mean Grad:      12.0 mmHg LVOT Vmax:         73.57 cm/s LVOT Vmean:        49.433 cm/s LVOT VTI:          0.119 m LVOT/AV VTI ratio: 0.22  AORTA Ao Root diam: 3.60 cm Ao Asc diam:  3.90 cm  MITRAL VALVE  TRICUSPID VALVE MV Area (PHT): 2.06 cm   TR Peak grad:   39.7 mmHg MV Area VTI:   0.82 cm   TR Vmax:        315.00 cm/s MV Peak grad:  13.0 mmHg MV Mean grad:  5.0 mmHg   SHUNTS MV Vmax:       1.80 m/s   Systemic VTI:  0.12 m MV Vmean:      98.8 cm/s  Systemic Diam: 2.00 cm  Armanda Magic MD Electronically signed by Armanda Magic MD Signature Date/Time: 12/13/2023/12:58:06 PM    Final   TEE  ECHO TEE 02/06/2020  Narrative TRANSESOPHOGEAL ECHO REPORT    Patient Name:   [name] Date of Exam: 02/06/2020 Medical Rec #:  [id number]      Height:       67.0 in Accession #:    [id number]     Weight:       188.0 lb Date of Birth:  [birth year]      BSA:          1.970 m Patient Age:    87 years       BP:           142/56 mmHg Patient Gender: M              HR:           54 bpm. Exam Location:  Inpatient  Procedure: Transesophageal Echo, Cardiac Doppler, Color Doppler and 3D Echo  Indications:     Mitral valve insufficiency 424.0 / 134.0  History:          Patient has prior history of Echocardiogram examinations, most recent 01/10/2020. CHF, Acute MI and CAD, Prior CABG, Pulmonary HTN, Mitral Valve Disease, Arrythmias:RBBB, Signs/Symptoms:Murmur; Risk Factors:Sleep Apnea, Hypertension, Dyslipidemia, Diabetes and Former Smoker. Carotid artery disease.  Mitral Valve: MitraClip NTW valve is present in the mitral position. Procedure Date: 02/06/2020.  Sonographer:     Leta Jungling RDCS Referring Phys:  [phone] Lyzbeth Genrich Diagnosing Phys: Tobias [name] MD  PROCEDURE: The transesophogeal probe was passed without difficulty through the esophogus of the patient. Sedation performed by different physician. The patient's vital signs; including heart rate, blood pressure, and oxygen saturation; remained stable throughout the procedure. The patient developed no complications during the procedure.  IMPRESSIONS   1. Primary mitral regurgitation with degenerative leaflets and 4+ MR, with 1 major jet directed centro-posteriorly. One NTW MitraClip was placed in between A1/2 and P1/2 with improvement of mitral regurgitation to mild to moderate. Mean transmitral gradient increased minimally from 2-> 3 mmHg. There was residual iatrogenic ASD at the fossa ovalis with left to right flow only. LVEF has decreased to approximately 40%. 2. Left ventricular ejection fraction, by estimation, is 45 to 50%. The left ventricle has mildly decreased function. The left ventricle has no regional wall motion abnormalities. 3. Right ventricular systolic function is mildly reduced. The right ventricular size is normal. 4. Left atrial size was severely dilated. No left atrial/left atrial appendage thrombus was detected. 5. Right atrial size was moderately dilated. 6. The mitral valve is degenerative. Severe mitral valve regurgitation. No evidence of mitral stenosis. The mean mitral valve gradient is 2.0 mmHg. There is a MitraClip NTW present in the mitral position. Procedure  Date: 02/06/2020. 7. Tricuspid valve regurgitation is moderate. 8. The aortic valve is normal in structure. Aortic valve regurgitation is mild. No aortic stenosis is present. 9. The inferior vena cava is normal in size with greater than  50% respiratory variability, suggesting right atrial pressure of 3 mmHg.  Conclusion(s)/Recommendation(s): Normal biventricular function without evidence of hemodynamically significant valvular heart disease.  FINDINGS Left Ventricle: Left ventricular ejection fraction, by estimation, is 45 to 50%. The left ventricle has mildly decreased function. The left ventricle has no regional wall motion abnormalities. The left ventricular internal cavity size was normal in size. There is no left ventricular hypertrophy.  Right Ventricle: The right ventricular size is normal. No increase in right ventricular wall thickness. Right ventricular systolic function is mildly reduced.  Left Atrium: Left atrial size was severely dilated. No left atrial/left atrial appendage thrombus was detected.  Right Atrium: Right atrial size was moderately dilated.  Pericardium: There is no evidence of pericardial effusion.  Mitral Valve: The mitral valve is degenerative in appearance. There is moderate thickening of the mitral valve leaflet(s). There is moderate calcification of the mitral valve leaflet(s). Normal mobility of the mitral valve leaflets. Mild mitral annular calcification. Severe mitral valve regurgitation. There is a MitraClip NTW present in the mitral position. Procedure Date: 02/06/2020. No evidence of mitral valve stenosis. MV peak gradient, 6.2 mmHg. The mean mitral valve gradient is 2.0 mmHg.  Tricuspid Valve: The tricuspid valve is normal in structure. Tricuspid valve regurgitation is moderate . No evidence of tricuspid stenosis.  Aortic Valve: The aortic valve is normal in structure.. There is mild thickening and mild calcification of the aortic valve. Aortic valve  regurgitation is mild. No aortic stenosis is present. There is mild thickening of the aortic valve. There is mild calcification of the aortic valve.  Pulmonic Valve: The pulmonic valve was normal in structure. Pulmonic valve regurgitation is not visualized. No evidence of pulmonic stenosis.  Aorta: The aortic root is normal in size and structure.  Venous: The inferior vena cava is normal in size with greater than 50% respiratory variability, suggesting right atrial pressure of 3 mmHg.  IAS/Shunts: No atrial level shunt detected by color flow Doppler.   MITRAL VALVE MV Area (PHT): 2.75 cm MV Peak grad:  6.2 mmHg MV Mean grad:  2.0 mmHg MV Vmax:       1.24 m/s MV Vmean:      60.5 cm/s MR Peak grad:    124.1 mmHg MR Mean grad:    80.0 mmHg MR Vmax:         557.00 cm/s MR Vmean:        422.5 cm/s MR PISA:         3.08 cm MR PISA Eff ROA: 20 mm MR PISA Radius:  0.70 cm  Tobias [name] MD Electronically signed by Tobias [name] MD Signature Date/Time: 02/06/2020/2:04:47 PM    Final        ______________________________________________________________________________________________      EKG:        Recent Labs: 01/31/2024: B Natriuretic Peptide 995.3 02/08/2024: ALT 17; Hemoglobin 10.1; Magnesium 2.3; Platelets 165.0 02/20/2024: BUN 37; Creatinine, Ser 2.98; Potassium 4.2; Sodium 137  Recent Lipid Panel    Component Value Date/Time   CHOL 119 10/30/2023 0907   TRIG 99.0 10/30/2023 0907   HDL 31.50 (L) 10/30/2023 0907   CHOLHDL 4 10/30/2023 0907   VLDL 19.8 10/30/2023 0907   LDLCALC 67 10/30/2023 0907     Risk Assessment/Calculations:    CHA2DS2-VASc Score = 6   This indicates a 9.7% annual risk of stroke. The patient's score is based upon: CHF History: 1 HTN History: 1 Diabetes History: 1 Stroke History: 0 Vascular Disease History: 1 Age Score: 2  Gender Score: 0               Physical Exam:    VS:  BP 120/60   Pulse 64   Ht 5\' 7"  (1.702 m)    Wt 161 lb 9.6 oz (73.3 kg)   SpO2 99%   BMI 25.31 kg/m     Wt Readings from Last 3 Encounters:  02/23/24 161 lb 9.6 oz (73.3 kg)  02/21/24 159 lb 9.6 oz (72.4 kg)  02/12/24 157 lb (71.2 kg)     GEN: Elderly male, no acute distress HEENT: Normal NECK: No JVD; No carotid bruits LYMPHATICS: No lymphadenopathy CARDIAC: Irregular with grade 2/6 crescendo decrescendo murmur at the right upper sternal border, A2 is present RESPIRATORY:  Clear to auscultation without rales, wheezing or rhonchi  ABDOMEN: Soft, non-tender, non-distended MUSCULOSKELETAL: 1+ bilateral pretibial and ankle edema; No deformity  SKIN: Warm and dry NEUROLOGIC:  Alert and oriented x 3 PSYCHIATRIC:  Normal affect   Assessment & Plan Nonrheumatic aortic valve stenosis The patient's prior echo study suggested severe low-flow low gradient aortic stenosis, but his mean gradient was only 12 mmHg.  He underwent consultation for TAVR and was referred for right and left heart catheterization.  His heart catheterization performed 2 weeks ago demonstrated a transvalvular gradient of only 6 mmHg and calculated aortic valve area of 2.3 cm.  I do not think he would benefit from TAVR and have recommended ongoing medical therapy.  In addition, the risks associated with proceeding with CT angiography studies and the TAVR procedure would be significant in the setting of his advanced age, severely reduced functional capacity, and progressive kidney disease.  Plan to continue with medical therapy.  Advised him that even if he has progressive aortic stenosis in the next year or 2, I would favor a conservative approach and would not recommend proceeding with TAVR. CKD (chronic kidney disease) stage 4, GFR 15-29 ml/min ([name]) Patient had acute kidney injury following a very low contrast load with cath and PCI.  Creatinine peaked at 3.5 and on follow-up a few days ago was back down to his baseline at 2.98.  He is back on torsemide at his home  dose.  He has close follow-up scheduled with Dr. Allena Katz. Permanent atrial fibrillation ([name]) He continues on apixaban. HFrEF (heart failure with reduced ejection fraction) ([name]) Patient with severe LV dysfunction and chronic heart failure symptoms, NYHA functional class III.  He has no resting symptoms and specifically denies orthopnea or PND.  Medical therapy is limited by his advanced kidney disease.  He continues on torsemide, metoprolol succinate. Coronary artery disease involving native coronary artery of native heart with other form of angina pectoris ([name]) Recent saphenous vein graft PCI.  Patient tolerating clopidogrel.  Initially he had a rash and was concerned about a drug reaction, but this has resolved.  Hopefully can tolerate clopidogrel for at least 3 months then it would be reasonable to treat him with apixaban alone to minimize his bleeding risk.  Will defer to Dr. Hazle Coca management.  For now he will continue on his current regimen.  He reports no anginal symptoms today.  I'd be happy to see the patient back in the future as needed.  Favor conservative medical therapy for his heart disease moving forward.  I think he would be at high risk of any intervention due to factors outlined above.            Medication Adjustments/Labs and Tests Ordered: Current medicines are reviewed  at length with the patient today.  Concerns regarding medicines are outlined above.  No orders of the defined types were placed in this encounter.  No orders of the defined types were placed in this encounter.   Patient Instructions  Medication Instructions:  Your physician recommends that you continue on your current medications as directed. Please refer to the Current Medication list given to you today.  *If you need a refill on your cardiac medications before your next appointment, please call your pharmacy*  Follow-Up: At [facility], you and your health needs are our priority.  As part of  our continuing mission to provide you with exceptional heart care, we have created designated Provider Care Teams.  These Care Teams include your primary Cardiologist (physician) and Advanced Practice Providers (APPs -  Physician Assistants and Nurse Practitioners) who all work together to provide you with the care you need, when you need it.  Your next appointment:   As needed  The format for your next appointment:   In Person  Provider:   Tonny Bollman, MD  Other Instructions   1st Floor: - Lobby - Registration  - Pharmacy  - Lab - Cafe  2nd Floor: - PV Lab - Diagnostic Testing (echo, CT, nuclear med)  3rd Floor: - Vacant  4th Floor: - TCTS (cardiothoracic surgery) - AFib Clinic - Structural Heart Clinic - Vascular Surgery  - Vascular Ultrasound  5th Floor: - HeartCare Cardiology (general and EP) - Clinical Pharmacy for coumadin, hypertension, lipid, weight-loss medications, and med management appointments    Valet parking services will be available as well.     Signed, Tonny Bollman, MD  02/23/2024 10:07 AM    [facility]

## 2024-02-26 DIAGNOSIS — I130 Hypertensive heart and chronic kidney disease with heart failure and stage 1 through stage 4 chronic kidney disease, or unspecified chronic kidney disease: Secondary | ICD-10-CM | POA: Diagnosis not present

## 2024-02-26 DIAGNOSIS — I5030 Unspecified diastolic (congestive) heart failure: Secondary | ICD-10-CM | POA: Diagnosis not present

## 2024-02-26 DIAGNOSIS — N184 Chronic kidney disease, stage 4 (severe): Secondary | ICD-10-CM | POA: Diagnosis not present

## 2024-02-26 DIAGNOSIS — E1122 Type 2 diabetes mellitus with diabetic chronic kidney disease: Secondary | ICD-10-CM | POA: Diagnosis not present

## 2024-02-26 DIAGNOSIS — I5023 Acute on chronic systolic (congestive) heart failure: Secondary | ICD-10-CM | POA: Diagnosis not present

## 2024-02-26 DIAGNOSIS — D631 Anemia in chronic kidney disease: Secondary | ICD-10-CM | POA: Diagnosis not present

## 2024-02-27 ENCOUNTER — Other Ambulatory Visit: Payer: Self-pay

## 2024-02-27 MED ORDER — METOPROLOL SUCCINATE ER 100 MG PO TB24: 100.0000 mg | ORAL_TABLET | Freq: Every day | ORAL | 3 refills | Status: DC

## 2024-02-27 MED ORDER — CLOPIDOGREL BISULFATE 75 MG PO TABS: ORAL_TABLET | ORAL | 3 refills | Status: DC

## 2024-02-28 ENCOUNTER — Other Ambulatory Visit: Payer: Self-pay

## 2024-02-28 NOTE — Transitions of Care (Post Inpatient/ED Visit) (Signed)
 Care Management  Transitions of Care Program Transitions of Care Post-discharge week 4  02/28/2024 Name: [name] MRN: [id number] DOB: [birth year]  Subjective: [name] is a 89 y.o. year old male who is a primary care patient of Donnie Galea, MD. The Care Management team was unable to reach the patient by phone to assess and address transitions of care needs.   Plan: Additional outreach attempts will be made to reach the patient enrolled in the [facility] Program (Post Inpatient/ED Visit).  Declines this week. Follow up in one week  Gareld June, [name], RN [place]  VBCI - [facility] RN Care Manager [phone]

## 2024-03-04 ENCOUNTER — Other Ambulatory Visit: Payer: Self-pay | Admitting: Cardiovascular Disease

## 2024-03-04 DIAGNOSIS — I340 Nonrheumatic mitral (valve) insufficiency: Secondary | ICD-10-CM | POA: Diagnosis not present

## 2024-03-04 DIAGNOSIS — I5023 Acute on chronic systolic (congestive) heart failure: Secondary | ICD-10-CM | POA: Diagnosis not present

## 2024-03-04 DIAGNOSIS — I6529 Occlusion and stenosis of unspecified carotid artery: Secondary | ICD-10-CM | POA: Diagnosis not present

## 2024-03-04 DIAGNOSIS — E1122 Type 2 diabetes mellitus with diabetic chronic kidney disease: Secondary | ICD-10-CM | POA: Diagnosis not present

## 2024-03-04 DIAGNOSIS — I272 Pulmonary hypertension, unspecified: Secondary | ICD-10-CM | POA: Diagnosis not present

## 2024-03-04 DIAGNOSIS — I4892 Unspecified atrial flutter: Secondary | ICD-10-CM | POA: Diagnosis not present

## 2024-03-04 DIAGNOSIS — N184 Chronic kidney disease, stage 4 (severe): Secondary | ICD-10-CM | POA: Diagnosis not present

## 2024-03-04 DIAGNOSIS — D631 Anemia in chronic kidney disease: Secondary | ICD-10-CM | POA: Diagnosis not present

## 2024-03-04 DIAGNOSIS — I4821 Permanent atrial fibrillation: Secondary | ICD-10-CM | POA: Diagnosis not present

## 2024-03-04 DIAGNOSIS — E1142 Type 2 diabetes mellitus with diabetic polyneuropathy: Secondary | ICD-10-CM | POA: Diagnosis not present

## 2024-03-04 DIAGNOSIS — I130 Hypertensive heart and chronic kidney disease with heart failure and stage 1 through stage 4 chronic kidney disease, or unspecified chronic kidney disease: Secondary | ICD-10-CM | POA: Diagnosis not present

## 2024-03-04 DIAGNOSIS — I5030 Unspecified diastolic (congestive) heart failure: Secondary | ICD-10-CM | POA: Diagnosis not present

## 2024-03-05 ENCOUNTER — Other Ambulatory Visit: Payer: Self-pay

## 2024-03-05 DIAGNOSIS — I5030 Unspecified diastolic (congestive) heart failure: Secondary | ICD-10-CM | POA: Diagnosis not present

## 2024-03-05 DIAGNOSIS — I130 Hypertensive heart and chronic kidney disease with heart failure and stage 1 through stage 4 chronic kidney disease, or unspecified chronic kidney disease: Secondary | ICD-10-CM | POA: Diagnosis not present

## 2024-03-05 DIAGNOSIS — E1122 Type 2 diabetes mellitus with diabetic chronic kidney disease: Secondary | ICD-10-CM | POA: Diagnosis not present

## 2024-03-05 DIAGNOSIS — I5023 Acute on chronic systolic (congestive) heart failure: Secondary | ICD-10-CM | POA: Diagnosis not present

## 2024-03-05 DIAGNOSIS — D631 Anemia in chronic kidney disease: Secondary | ICD-10-CM | POA: Diagnosis not present

## 2024-03-05 DIAGNOSIS — N184 Chronic kidney disease, stage 4 (severe): Secondary | ICD-10-CM | POA: Diagnosis not present

## 2024-03-05 NOTE — Transitions of Care (Post Inpatient/ED Visit) (Signed)
 Transition of Care week 4  Visit Note  03/05/2024  Name: [name] MRN: [id number]          DOB: 1931/11/19  Situation: Patient enrolled in [facility] 30-day program. Visit completed with Norberto Bear by telephone.   Background:     Past Medical History:  Diagnosis Date   Age-related macular degeneration, wet, both eyes ([name])    Anemia    Secondary to acute blood loss   Anginal pain ([name])    last chest pain in Feb 2021   Arthritis    "mild in hands, knees, ankles" (11/13/2015)   Atrial fibrillation ([name])    Consideration was given for atrial flutter ablation, but patient developed atrial fibrillation. Cardioversion was done. Dr. Rodolfo Clan decided to watch him clinically. November, 2011   Atrial flutter [facility]) 07/2010   September, 2011   Hospital with PNA and cath done...Coumadin .  Atrial flutter ablation planned, but  pt. then had atrial fibrillation,/outpatient conversion 09/08/10..NSR..plan to follow..Dr. Rodolfo Clan   CAD (coronary artery disease)    Catheterization, September, 2011,  grafts patent from redo CABG,, medical therapy of coronary disease, consideration to proceeding with atrial flutter ablation   Carotid artery disease ([name])    Doppler 09/18/2009 - 49% bilateral stenoses   Carotid artery disease ([name])    49% bilateral, Doppler, November, 2010   CHF (congestive heart failure) ([name])    Chronic kidney disease (CKD), stage III (moderate) ([name])    Diabetic peripheral neuropathy ([name])    feet   Diverticulosis of colon with hemorrhage 2009   several unit diverticular bleed    Erectile dysfunction    Mild   Gout    Hearing loss    wears hearing aids   History of blood transfusion "several"   related to diverticular bleeding   Hyperlipidemia    Hypertension    Mitral regurgitation    Mild, echo, September, 2011   NSTEMI (non-ST elevated myocardial infarction) ([name]) 07/2010   at [facility] with repeat cath, rec medical mgmt    OSA on CPAP    uses CPAP nightly   Personal history  of colonic polyps    PNA (pneumonia) 07/2010   NSTEMI at [facility] with repeat cath, rec medical mgmt    RBBB (right bundle branch block)    S/P mitral valve repair 02/06/2020   s/p TEER with a single MitraClip NTW placed on A2/P2   Shoulder pain    "positional; better now" (11/13/2015)   Skin cancer    R lower leg, per derm 2012   Type II diabetes mellitus ([name])     Assessment: TOC Outreach completed to the patient. He celebrated his 89th birthday last week with family. He states he has talked with his providers and they have assured him that he is on the correct medication regime. He feels good about his anticoagulation medications. His blood sugar today is 189. His weight is 160lbs. He has his annual appointment scheduled. HHPT showed up today to help improve strength and endurance. The patient declines further outreach   Patient Reported Symptoms: Cognitive Cognitive Status: Alert and oriented to person, place, and time      Neurological Neurological Review of Symptoms: No symptoms reported    HEENT HEENT Symptoms Reported: No symptoms reported      Cardiovascular Cardiovascular Symptoms Reported: No symptoms reported Does patient have uncontrolled Hypertension?: No Cardiovascular Conditions: Coronary artery disease, Hypertension, Heart failure Cardiovascular Management Strategies: Medication therapy, Fluid modification, Weight management Do You Have a Working  Readable Scale?: Yes Weight: 160 lb (72.6 kg) Cardiovascular Self-Management Outcome: 4 (good) Cardiovascular Comment: His PCP feels he is on the correct medication program  Respiratory Respiratory Symptoms Reported: No symptoms reported    Endocrine Patient reports the following symptoms related to hypoglycemia or hyperglycemia : No symptoms reported Is patient diabetic?: Yes Is patient checking blood sugars at home?: Yes Endocrine Conditions: Diabetes Endocrine Management Strategies: Medication therapy, Diet modification,  Weight management, Exercise Endocrine Self-Management Outcome: 4 (good) Endocrine Comment: the patient feels he his Glucose is stable  Gastrointestinal Gastrointestinal Symptoms Reported: No symptoms reported   Nutrition Risk Screen (CP): No indicators present  Genitourinary Genitourinary Symptoms Reported: No symptoms reported    Integumentary Integumentary Symptoms Reported: No symptoms reported    Musculoskeletal Musculoskelatal Symptoms Reviewed: Difficulty walking, Weakness Musculoskeletal Conditions: Frailty syndrome, Osteoporosis Musculoskeletal Management Strategies: Exercise, Medical device Musculoskeletal Comment: Just started with HHPT Falls in the past year?: Yes Number of falls in past year: 1 or less Was there an injury with Fall?: No Fall Risk Category Calculator: 1 Patient Fall Risk Level: Low Fall Risk Patient at Risk for Falls Due to: History of fall(s) Fall risk Follow up: Falls prevention discussed  Psychosocial Psychosocial Symptoms Reported: No symptoms reported         There were no vitals filed for this visit.  Medications Reviewed Today     Reviewed by Claudene Crystal, RN (Case Manager) on 03/05/24 at 1151  Med List Status: <None>   Medication Order Taking? Sig Documenting Provider Last Dose Status Informant  acetaminophen  (TYLENOL ) 325 MG tablet [id number]  Take 2 tablets (650 mg total) by mouth every 6 (six) hours as needed for mild pain (pain score 1-3) (or Fever >/= 101). [address], [place], [place]  Active   allopurinol  (ZYLOPRIM ) 100 MG tablet [id number]  TAKE ONE-HALF (1/2) TABLET EVERY MONDAY, WEDNESDAY AND FRIDAY Donnie Galea, MD  Active Self, Pharmacy Records  Cholecalciferol  (VITAMIN D ) 1000 UNITS capsule [id number]  Take 1,000 Units by mouth at bedtime. [provider]  Active Self, Pharmacy Records  clopidogrel  (PLAVIX ) 75 MG tablet [id number]  take 1 tablet by mouth daily. Arnoldo Lapping, MD  Active   ELIQUIS  2.5 MG TABS tablet  [id number]  TAKE 1 TABLET TWICE A DAY Avanell Leigh, MD  Active Self, Pharmacy Records  fish oil-omega-3 fatty acids 1000 MG capsule [id number]  Take 1 g by mouth at bedtime. [provider]  Active Self, Pharmacy Records  glucose blood (FREESTYLE LITE) test strip [id number]  USE TO TEST BLOOD SUGAR ONCE DAILY AND AS NEEDED Donnie Galea, MD  Active Self, Pharmacy Records  hydrALAZINE  (APRESOLINE ) 25 MG tablet [id number]  Take 1 tablet (25 mg total) by mouth daily. Avanell Leigh, MD  Active Self, Pharmacy Records  HYDROcodone -acetaminophen  (NORCO/VICODIN) 5-325 MG tablet [id number] No Take 0.5-1 tablets by mouth 2 (two) times daily as needed for moderate pain (pain score 4-6).  Patient not taking: Reported on 03/05/2024   Donnie Galea, MD Not Taking Active Self, Pharmacy Records  hydrOXYzine  (VISTARIL ) 25 MG capsule [id number]  Take 1 capsule (25 mg total) by mouth every 8 (eight) hours as needed. Jolanda Nation, NP  Active   ipratropium (ATROVENT ) 0.03 % nasal spray [id number]  USE 2 SPRAYS IN EACH NOSTRIL TWICE A DAY AS NEEDED FOR RHINITIS Donnie Galea, MD  Active Self, Pharmacy Records  isosorbide  mononitrate (IMDUR ) 60 MG 24 hr tablet [id number]  Take 1.5 tablets (90 mg total) by mouth daily.  Avanell Leigh, MD  Active Self, Pharmacy Records           Med Note Vallarie Gauze, Laurey Poor Feb 08, 2024 12:26 PM)    magnesium  oxide (MAG-OX) 400 MG tablet [id number]  Take 1 tablet (400 mg total) by mouth every other day. Donnie Galea, MD  Active Self, Pharmacy Records  metoprolol  succinate (TOPROL -XL) 100 MG 24 hr tablet [id number]  Take 1 tablet (100 mg total) by mouth daily. Take with or immediately following a meal. Arnoldo Lapping, MD  Active   nitroGLYCERIN  (NITROSTAT ) 0.4 MG SL tablet [id number]  DISSOLVE 1 TABLET UNDER THE TONGUE EVERY 5 MINUTES AS NEEDED FOR CHEST PAIN FOR A MAXIMUM OF 3 DOSES Avanell Leigh, MD  Active Self, Pharmacy Records  polyethylene glycol (MIRALAX  /  GLYCOLAX ) 17 g packet [id number]  Take 17 g by mouth daily. [provider]  Active Self, Pharmacy Records  Potassium Gluconate 595 MG CAPS [id number]  Take 595 mg by mouth in the morning. [provider]  Active Self, Pharmacy Records  ranolazine  (RANEXA ) 500 MG 12 hr tablet [id number]  TAKE 1 TABLET TWICE A DAY Avanell Leigh, MD  Active Self, Pharmacy Records  rosuvastatin  (CRESTOR ) 10 MG tablet [id number]  TAKE 1 TABLET DAILY Avanell Leigh, MD  Active   torsemide  (DEMADEX ) 20 MG tablet [id number]  Take 1 tab daily in the AM if weight is increasing. Donnie Galea, MD  Active   Med List Note Karolynn Pack, CPhT 01/31/24 1615): [facility] is his preferred long term medication pharmacy            Recommendation:   Follow up with Providers  Follow Up Plan:   Discharge from [facility], [name], RN [place]  VBCI - [facility] RN Care Manager [phone]

## 2024-03-05 NOTE — Patient Instructions (Signed)
 Visit Information  Thank you for taking time to visit with me today. Please don't hesitate to contact me if I can be of assistance to you before our next scheduled telephone appointment.  Following is a copy of your care plan:   Goals Addressed             This Visit's Progress    COMPLETED: TOC Care Plan       Current Barriers: (reviewed 03/05/24) Knowledge Deficits related to plan of care for management of CHF, DMII, and Hypoglycemia associated with diabetes  and Atrial fib  RNCM Clinical Goal(s): (reviewed 03/05/24) Patient will work with the Care Management team over the next 30 days to address Transition of Care Barriers: Medication access Medication Management Diet/Nutrition/Food Resources Support at home Provider appointments Home Health services verbalize understanding of plan for management of CHF and DMII as evidenced by No Hospitalizations in the next 30 days take all medications exactly as prescribed and will call provider for medication related questions as evidenced by Electronic Medical Record attend all scheduled medical appointments: PCP and Specialists as evidenced by Electronic Medical Record  through collaboration with RN Care manager, provider, and care team.   Interventions: (reviewed 03/05/24) Evaluation of current treatment plan related to  self management and patient's adherence to plan as established by provider Weigh daily, first thing in the morning Report a weight gain of more than 2 pounds in one day or 5 pounds in one week Record blood glucose twice daily Report blood glucose to the provider of greater than 300 Report excessive bleeding from gums, cuts, and brusises Avoid straight razors Follow diet approved by the provider  Transitions of Care:  Goal on track:  Yes. (reviewed 03/05/24) Doctor Visits  - discussed the importance of doctor visits Contacted provider for patient needs   Arranged PCP follow-up within 7 days (Care Guide Scheduled) 02/14/24  - Provider discontinued the Brilinta  and started the patient on Plavix   Patient Goals/Self-Care Activities: (reviewed 03/05/24) Participate in Transition of Care Program/Attend [facility] scheduled calls Notify RN Care Manager of TOC call rescheduling needs Take all medications as prescribed Attend all scheduled provider appointments Call pharmacy for medication refills 3-7 days in advance of running out of medications Perform all self care activities independently  Call provider office for new concerns or questions   Follow Up Plan:  The patient declines further follow up appointments. He feels he is being well looked after by his family and his PCP         Patient verbalizes understanding of instructions and care plan provided today and agrees to view in MyChart. Active MyChart status and patient understanding of how to access instructions and care plan via MyChart confirmed with patient.     The patient has been provided with contact information for the care management team and has been advised to call with any health related questions or concerns.   Please call the care guide team at [phone] if you need to cancel or reschedule your appointment.   Please call the Suicide and Crisis Lifeline: 988 call the [place]  National Suicide Prevention Lifeline: 808-724-4646 or TTY: (539)211-5070 TTY 650-074-8443) to talk to a trained counselor if you are experiencing a Mental Health or Behavioral Health Crisis or need someone to talk to. Gareld June, [name], RN [place]  VBCI - [employer] [facility] RN Care Manager [phone]

## 2024-03-06 DIAGNOSIS — I272 Pulmonary hypertension, unspecified: Secondary | ICD-10-CM | POA: Diagnosis not present

## 2024-03-06 DIAGNOSIS — Z6834 Body mass index (BMI) 34.0-34.9, adult: Secondary | ICD-10-CM | POA: Diagnosis not present

## 2024-03-06 DIAGNOSIS — E663 Overweight: Secondary | ICD-10-CM | POA: Diagnosis not present

## 2024-03-06 DIAGNOSIS — E785 Hyperlipidemia, unspecified: Secondary | ICD-10-CM | POA: Diagnosis not present

## 2024-03-06 DIAGNOSIS — D631 Anemia in chronic kidney disease: Secondary | ICD-10-CM | POA: Diagnosis not present

## 2024-03-06 DIAGNOSIS — Z66 Do not resuscitate: Secondary | ICD-10-CM | POA: Diagnosis not present

## 2024-03-06 DIAGNOSIS — I130 Hypertensive heart and chronic kidney disease with heart failure and stage 1 through stage 4 chronic kidney disease, or unspecified chronic kidney disease: Secondary | ICD-10-CM | POA: Diagnosis not present

## 2024-03-06 DIAGNOSIS — E1122 Type 2 diabetes mellitus with diabetic chronic kidney disease: Secondary | ICD-10-CM | POA: Diagnosis not present

## 2024-03-06 DIAGNOSIS — I2510 Atherosclerotic heart disease of native coronary artery without angina pectoris: Secondary | ICD-10-CM | POA: Diagnosis not present

## 2024-03-06 DIAGNOSIS — I5023 Acute on chronic systolic (congestive) heart failure: Secondary | ICD-10-CM | POA: Diagnosis not present

## 2024-03-06 DIAGNOSIS — H3530 Unspecified macular degeneration: Secondary | ICD-10-CM | POA: Diagnosis not present

## 2024-03-06 DIAGNOSIS — I350 Nonrheumatic aortic (valve) stenosis: Secondary | ICD-10-CM | POA: Diagnosis not present

## 2024-03-06 DIAGNOSIS — N184 Chronic kidney disease, stage 4 (severe): Secondary | ICD-10-CM | POA: Diagnosis not present

## 2024-03-06 DIAGNOSIS — G4730 Sleep apnea, unspecified: Secondary | ICD-10-CM | POA: Diagnosis not present

## 2024-03-06 DIAGNOSIS — E11649 Type 2 diabetes mellitus with hypoglycemia without coma: Secondary | ICD-10-CM | POA: Diagnosis not present

## 2024-03-06 DIAGNOSIS — I4821 Permanent atrial fibrillation: Secondary | ICD-10-CM | POA: Diagnosis not present

## 2024-03-06 DIAGNOSIS — E1142 Type 2 diabetes mellitus with diabetic polyneuropathy: Secondary | ICD-10-CM | POA: Diagnosis not present

## 2024-03-06 DIAGNOSIS — K219 Gastro-esophageal reflux disease without esophagitis: Secondary | ICD-10-CM | POA: Diagnosis not present

## 2024-03-06 DIAGNOSIS — E8809 Other disorders of plasma-protein metabolism, not elsewhere classified: Secondary | ICD-10-CM | POA: Diagnosis not present

## 2024-03-06 DIAGNOSIS — I4892 Unspecified atrial flutter: Secondary | ICD-10-CM | POA: Diagnosis not present

## 2024-03-06 DIAGNOSIS — I4510 Unspecified right bundle-branch block: Secondary | ICD-10-CM | POA: Diagnosis not present

## 2024-03-06 DIAGNOSIS — I5030 Unspecified diastolic (congestive) heart failure: Secondary | ICD-10-CM | POA: Diagnosis not present

## 2024-03-06 DIAGNOSIS — I6529 Occlusion and stenosis of unspecified carotid artery: Secondary | ICD-10-CM | POA: Diagnosis not present

## 2024-03-06 DIAGNOSIS — M109 Gout, unspecified: Secondary | ICD-10-CM | POA: Diagnosis not present

## 2024-03-06 DIAGNOSIS — I340 Nonrheumatic mitral (valve) insufficiency: Secondary | ICD-10-CM | POA: Diagnosis not present

## 2024-03-08 ENCOUNTER — Other Ambulatory Visit: Payer: Self-pay | Admitting: Family Medicine

## 2024-03-12 DIAGNOSIS — E1122 Type 2 diabetes mellitus with diabetic chronic kidney disease: Secondary | ICD-10-CM | POA: Diagnosis not present

## 2024-03-12 DIAGNOSIS — I5030 Unspecified diastolic (congestive) heart failure: Secondary | ICD-10-CM | POA: Diagnosis not present

## 2024-03-12 DIAGNOSIS — D631 Anemia in chronic kidney disease: Secondary | ICD-10-CM | POA: Diagnosis not present

## 2024-03-12 DIAGNOSIS — I5023 Acute on chronic systolic (congestive) heart failure: Secondary | ICD-10-CM | POA: Diagnosis not present

## 2024-03-12 DIAGNOSIS — I130 Hypertensive heart and chronic kidney disease with heart failure and stage 1 through stage 4 chronic kidney disease, or unspecified chronic kidney disease: Secondary | ICD-10-CM | POA: Diagnosis not present

## 2024-03-12 DIAGNOSIS — N184 Chronic kidney disease, stage 4 (severe): Secondary | ICD-10-CM | POA: Diagnosis not present

## 2024-03-19 ENCOUNTER — Encounter: Payer: Self-pay | Admitting: Cardiovascular Disease

## 2024-03-19 ENCOUNTER — Ambulatory Visit: Payer: Medicare Other | Attending: Cardiovascular Disease | Admitting: Cardiovascular Disease

## 2024-03-19 VITALS — BP 118/52 | HR 63 | Ht 67.0 in | Wt 160.2 lb

## 2024-03-19 DIAGNOSIS — I350 Nonrheumatic aortic (valve) stenosis: Secondary | ICD-10-CM | POA: Diagnosis not present

## 2024-03-19 DIAGNOSIS — I6521 Occlusion and stenosis of right carotid artery: Secondary | ICD-10-CM | POA: Diagnosis not present

## 2024-03-19 DIAGNOSIS — I10 Essential (primary) hypertension: Secondary | ICD-10-CM | POA: Insufficient documentation

## 2024-03-19 DIAGNOSIS — Z951 Presence of aortocoronary bypass graft: Secondary | ICD-10-CM | POA: Insufficient documentation

## 2024-03-19 DIAGNOSIS — I340 Nonrheumatic mitral (valve) insufficiency: Secondary | ICD-10-CM | POA: Insufficient documentation

## 2024-03-19 DIAGNOSIS — G4733 Obstructive sleep apnea (adult) (pediatric): Secondary | ICD-10-CM | POA: Diagnosis not present

## 2024-03-19 DIAGNOSIS — I5020 Unspecified systolic (congestive) heart failure: Secondary | ICD-10-CM | POA: Insufficient documentation

## 2024-03-19 DIAGNOSIS — I4821 Permanent atrial fibrillation: Secondary | ICD-10-CM | POA: Insufficient documentation

## 2024-03-19 DIAGNOSIS — E782 Mixed hyperlipidemia: Secondary | ICD-10-CM | POA: Diagnosis not present

## 2024-03-19 NOTE — Assessment & Plan Note (Signed)
History of obstructive sleep apnea currently not wearing his CPAP.

## 2024-03-19 NOTE — Assessment & Plan Note (Signed)
 History of nonrheumatic severe MR status post MitraClip by Dr. Arlester Ladd 02/06/2020.  His most recent echo revealed only mild MR.

## 2024-03-19 NOTE — Assessment & Plan Note (Signed)
 Permanent A-fib rate controlled on Eliquis  oral anticoagulation.

## 2024-03-19 NOTE — Assessment & Plan Note (Signed)
 History of hyperlipidemia----on statin therapy with lipid profile performed 10/30/2023 bili total cholesterol 119, LDL 67 HDL 31.

## 2024-03-19 NOTE — Patient Instructions (Signed)
 Medication Instructions:  Your physician recommends that you continue on your current medications as directed. Please refer to the Current Medication list given to you today.  *If you need a refill on your cardiac medications before your next appointment, please call your pharmacy*  Follow-Up: At [facility], you and your health needs are our priority.  As part of our continuing mission to provide you with exceptional heart care, our providers are all part of one team.  This team includes your primary Cardiologist (physician) and Advanced Practice Providers or APPs (Physician Assistants and Nurse Practitioners) who all work together to provide you with the care you need, when you need it.  Your next appointment:   6 month(s)  Provider:   Callie Goodrich, PA-C         Then, Lauro Portal, MD will plan to see you again in 12 month(s).     We recommend signing up for the patient portal called "MyChart".  Sign up information is provided on this After Visit Summary.  MyChart is used to connect with patients for Virtual Visits (Telemedicine).  Patients are able to view lab/test results, encounter notes, upcoming appointments, etc.  Non-urgent messages can be sent to your provider as well.   To learn more about what you can do with MyChart, go to [email or web address].

## 2024-03-19 NOTE — Assessment & Plan Note (Signed)
 History of coronary artery disease status post bypass grafting in 2006 by Dr. Rollene Clink.  I cathed him 08/02/2018 revealing an occluded RCA vein graft with high-grade first obtuse marginal branch ostial stenosis which I was unable to access because of geometry.  Because of nitrate responsive angina he underwent cardiac catheterization by Dr. Arlester Ladd 02/02/2024 with stenting of his diagonal branch vein graft.  Since that time he has had to use no sublingual nitroglycerin .

## 2024-03-19 NOTE — Assessment & Plan Note (Signed)
 History of essential hypertension with blood pressure measured today at 118/52.  He is on hydralazine  and metoprolol .

## 2024-03-19 NOTE — Assessment & Plan Note (Signed)
 History of carotid artery disease status post endarterectomy of the right carotid by Dr. Shirley Douglas in 1994.  His most recent carotid Dopplers performed in February of this year revealed a widely patent endarterectomy site.

## 2024-03-19 NOTE — Progress Notes (Signed)
 03/19/2024 BALEY [name]   1932/01/30  [id number]  Primary Physician Donnie Galea, MD Primary Cardiologist: Avanell Leigh MD Lathan Poke, [place], [place]  HPI:  [name] is a 89 y.o.    single Caucasian male  formerly a patient of Dr. Leora Rand. I last saw him in the office 10/04/2023. [name] is widowed, lives alone and is the father of one daughter and one son , the grandfather to 2 grandchildren.  He is accompanied by his daughter [name].  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 His past medical history is remarkable for coronary artery bypass grafting in 1996 and again in 2006 by Dr. Rollene Clink. He had remote right carotid endarterectomy by Dr. Shirley Douglas in 1994. History of hypertension, hyperlipidemia and diabetes. He was hospitalized in late July 2015 because of unstable angina. Dr. Felipe Horton performed cardiac catheterization revealing a high-grade obtuse marginal branch vein graft stenosis which was stented. Because of recurrent chest pain he was re-intervention relates later and found to have a new hazy lesion just proximal to the previously placed stent which was again intervened on. He was deemed a Plavix  nonresponder and was begun on Brilenta. He also has a remote history of PAF as well as diverticulitis and has had GI bleed on Coumadin . His oral anticoagulation was discontinued.    He was recently admitted on 11/13/15 for a GI bleed. He underwent endoscopy revealing gastritis, colonoscopy revealing bleeding diverticula which was treated with clipping. He had 3 units of packed red blood cell transfusion. He did have chest pain and positive enzymes after his endoscopy within the echo that showed an EF of 50% with a new anterior wall motion and reality. He was thought to have "a non-STEMI.  His Plavix  was discontinued. Because of his moderate renal insufficiency and his inability to take antiplatelet therapy he did not undergo repeat  cardiac catheterization. He underwent left-sided hemicolectomy and sigmoidectomy because of chronic GI bleeding. This was uneventful. His hemoglobin has remained stable in the 12 range last checked in April.   Because of his new onset chest pain and dyspnea performed a Myoview  stress test that showed a new inferolateral scar/ischemic abnormality and echo that showed segmental wall motion abnormalities.  Because of this I performed diagnostic coronary arteriography on him 08/02/2018 revealing a newly occluded RCA vein graft with a high-grade first circumflex obtuse marginal branch ostial stenosis which I was unable to access because of geometry.  S   He saw Humphrey Magnuson back in the office 03/20/2019 complaining of increasing dyspnea.  Low-dose long-acting nitrates were added.  Hemoglobin was stable at 11.  He continues to complain of increasing dyspnea.  He is in A. fib with a controlled ventricular response on Eliquis .     He had a right heart cath by Dr. Bensimhon for severe MR which he was symptomatic from.  He ultimately underwent successful edge-to-edge A2/P2 mitral clipping by Dr. Arlester Ladd 02/06/2020 Katheryne Pane resulting in reduction of 4+ to 1+ MR. 11/24/2022 revealed a decline in his EF from 50 to 55% down to 35 to 40%.  He did have moderate pulm hypertension which is unchanged.  He had mild MR status post mitral clipping.  He had probably at least moderate aortic stenosis (low gradient) with an aortic valve area measured 0.87 cm.   Recently he has noticed increasing fatigue, dyspnea and chest pain.  He saw Pattricia Bores and Marlyse Single in the office approximately  week ago who began him on Imdur .  His chest pain has improved.  It has been 4 years since his last cath although now his serum creatinine is 3.7 making invasive evaluation problematic.  He wishes to continue with medical therapy.   When I saw him 3 months ago he was complaining of better response of angina and symptoms of heart failure.  2D echo  showed moderately severe LV dysfunction, only mild MR status post MitraClip and low-flow low gradient severe aortic stenosis.  I referred him to Dr. Arlester Ladd with the intent of considering TAVR.  He was admitted in March with hypoglycemia and heart failure.  He underwent right left heart cath by Dr. Arlester Ladd and stenting of his diagonal branch vein graft.  His aortic valve area was calculated 2.3 cm with only a 6 mm gradient.  Since discharge he has done well.  He has mild peripheral edema on torsemide  twice daily but has not had to use topical nitroglycerin .   Current Meds  Medication Sig   acetaminophen  (TYLENOL ) 325 MG tablet Take 2 tablets (650 mg total) by mouth every 6 (six) hours as needed for mild pain (pain score 1-3) (or Fever >/= 101).   allopurinol  (ZYLOPRIM ) 100 MG tablet TAKE ONE-HALF (1/2) TABLET EVERY MONDAY, WEDNESDAY AND FRIDAY   Cholecalciferol  (VITAMIN D ) 1000 UNITS capsule Take 1,000 Units by mouth at bedtime.   clopidogrel  (PLAVIX ) 75 MG tablet take 1 tablet by mouth daily.   ELIQUIS  2.5 MG TABS tablet TAKE 1 TABLET TWICE A DAY   fish oil-omega-3 fatty acids 1000 MG capsule Take 1 g by mouth at bedtime.   glucose blood (FREESTYLE LITE) test strip USE TO TEST BLOOD SUGAR ONCE DAILY AND AS NEEDED   hydrALAZINE  (APRESOLINE ) 25 MG tablet Take 1 tablet (25 mg total) by mouth daily.   hydrOXYzine  (VISTARIL ) 25 MG capsule Take 1 capsule (25 mg total) by mouth every 8 (eight) hours as needed.   ipratropium (ATROVENT ) 0.03 % nasal spray USE 2 SPRAYS IN EACH NOSTRIL TWICE A DAY AS NEEDED FOR RHINITIS   isosorbide  mononitrate (IMDUR ) 60 MG 24 hr tablet Take 1.5 tablets (90 mg total) by mouth daily.   JARDIANCE  10 MG TABS tablet Take 10 mg by mouth daily.   magnesium  oxide (MAG-OX) 400 MG tablet Take 1 tablet (400 mg total) by mouth every other day.   metoprolol  succinate (TOPROL -XL) 100 MG 24 hr tablet Take 1 tablet (100 mg total) by mouth daily. Take with or immediately following a meal.    polyethylene glycol (MIRALAX  / GLYCOLAX ) 17 g packet Take 17 g by mouth daily.   Potassium Gluconate 595 MG CAPS Take 595 mg by mouth in the morning.   ranolazine  (RANEXA ) 500 MG 12 hr tablet TAKE 1 TABLET TWICE A DAY   rosuvastatin  (CRESTOR ) 10 MG tablet TAKE 1 TABLET DAILY     Allergies  Allergen Reactions   Brilinta  [Ticagrelor ]     bleeding   Nsaids     CKD    Social History   Socioeconomic History   Marital status: Widowed    Spouse name: Not on file   Number of children: 2   Years of education: Not on file   Highest education level: Not on file  Occupational History   Occupation: Retired Surveyor, minerals. Rep. Equities trader: RETIRED  Tobacco Use   Smoking status: Former    Current packs/day: 0.00    Average packs/day: 1 pack/day for 8.0 years (8.0  ttl pk-yrs)    Types: Cigarettes    Start date: 33    Quit date: 96    Years since quitting: 17.3   Smokeless tobacco: Never   Tobacco comments:    "quit smoking cigarettes in 1958"  Vaping Use   Vaping status: Never Used  Substance and Sexual Activity   Alcohol use: No    Alcohol/week: 0.0 standard drinks of alcohol   Drug use: No   Sexual activity: Yes  Other Topics Concern   Not on file  Social History Narrative   From [place].  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     Lives alone.    Social Drivers of Corporate investment banker Strain: Low Risk  (10/31/2023)   Overall Financial Resource Strain (CARDIA)    Difficulty of Paying Living Expenses: Not hard at all  Food Insecurity: No Food Insecurity (02/14/2024)   Hunger Vital Sign    Worried About Running Out of Food in the Last Year: Never true    Ran Out of Food in the Last Year: Never true  Transportation Needs: No Transportation Needs (02/14/2024)   PRAPARE - Administrator, Civil Service (Medical): No    Lack of Transportation (Non-Medical): No  Physical Activity: Inactive (10/31/2023)   Exercise Vital  Sign    Days of Exercise per Week: 0 days    Minutes of Exercise per Session: 0 min  Stress: No Stress Concern Present (10/31/2023)   [employer] of Occupational Health - Occupational Stress Questionnaire    Feeling of Stress : Not at all  Social Connections: Moderately Isolated (01/31/2024)   Social Connection and Isolation Panel [NHANES]    Frequency of Communication with Friends and Family: More than three times a week    Frequency of Social Gatherings with Friends and Family: More than three times a week    Attends Religious Services: Never    Database administrator or Organizations: Yes    Attends Banker Meetings: 1 to 4 times per year    Marital Status: Widowed  Intimate Partner Violence: Not At Risk (02/14/2024)   Humiliation, Afraid, Rape, and Kick questionnaire    Fear of Current or Ex-Partner: No    Emotionally Abused: No    Physically Abused: No    Sexually Abused: No     Review of Systems: General: negative for chills, fever, night sweats or weight changes.  Cardiovascular: negative for chest pain, dyspnea on exertion, edema, orthopnea, palpitations, paroxysmal nocturnal dyspnea or shortness of breath Dermatological: negative for rash Respiratory: negative for cough or wheezing Urologic: negative for hematuria Abdominal: negative for nausea, vomiting, diarrhea, bright red blood per rectum, melena, or hematemesis Neurologic: negative for visual changes, syncope, or dizziness All other systems reviewed and are otherwise negative except as noted above.    Blood pressure (!) 118/52, pulse 63, height 5\' 7"  (1.702 m), weight 160 lb 3.2 oz (72.7 kg), SpO2 95%.  General appearance: alert and no distress Neck: no adenopathy, no carotid bruit, no JVD, supple, symmetrical, trachea midline, and thyroid  not enlarged, symmetric, no tenderness/mass/nodules Lungs: clear to auscultation bilaterally Heart: irregularly irregular rhythm Extremities: 1+ edema  bilaterally Pulses: 2+ and symmetric Skin: Skin color, texture, turgor normal. No rashes or lesions Neurologic: Grossly normal  EKG not performed today      ASSESSMENT AND PLAN:   HLD (hyperlipidemia) History of hyperlipidemia----on statin therapy with lipid profile performed 10/30/2023 bili total cholesterol  119, LDL 67 HDL 31.  HTN (hypertension) History of essential hypertension with blood pressure measured today at 118/52.  He is on hydralazine  and metoprolol .  Sleep apnea History of obstructive sleep apnea currently not wearing his CPAP.  Carotid artery disease ([name]) History of carotid artery disease status post endarterectomy of the right carotid by Dr. Shirley Douglas in 1994.  His most recent carotid Dopplers performed in February of this year revealed a widely patent endarterectomy site.  Hx of CABG History of coronary artery disease status post bypass grafting in 2006 by Dr. Rollene Clink.  I cathed him 08/02/2018 revealing an occluded RCA vein graft with high-grade first obtuse marginal branch ostial stenosis which I was unable to access because of geometry.  Because of nitrate responsive angina he underwent cardiac catheterization by Dr. Arlester Ladd 02/02/2024 with stenting of his diagonal branch vein graft.  Since that time he has had to use no sublingual nitroglycerin .  Non-rheumatic mitral regurgitation History of nonrheumatic severe MR status post MitraClip by Dr. Arlester Ladd 02/06/2020.  His most recent echo revealed only mild MR.  HFrEF (heart failure with reduced ejection fraction) ([name]) History of heart failure with reduced EF.  His most recent echo performed 12/13/2023 revealed EF in the 30 to 35% range.  Moderate renal insufficiency precludes GDMT.  Permanent atrial fibrillation ([name]) Permanent A-fib rate controlled on Eliquis  oral anticoagulation.  Aortic stenosis 2D echo suggested low gradient low flow severe aortic stenosis with a valve area 0.68 cm performed 12/13/2023.  Patient had  right left heart cath by Dr. Arlester Ladd that she only showed a 6 mmHg gradient suggesting only mild aortic stenosis.     Avanell Leigh MD FACP,FACC,FAHA, [facility] 03/19/2024 3:02 PM

## 2024-03-19 NOTE — Assessment & Plan Note (Signed)
 History of heart failure with reduced EF.  His most recent echo performed 12/13/2023 revealed EF in the 30 to 35% range.  Moderate renal insufficiency precludes GDMT.

## 2024-03-19 NOTE — Assessment & Plan Note (Signed)
 2D echo suggested low gradient low flow severe aortic stenosis with a valve area 0.68 cm performed 12/13/2023.  Patient had right left heart cath by Dr. Arlester Ladd that she only showed a 6 mmHg gradient suggesting only mild aortic stenosis.

## 2024-03-22 DIAGNOSIS — I5023 Acute on chronic systolic (congestive) heart failure: Secondary | ICD-10-CM | POA: Diagnosis not present

## 2024-03-22 DIAGNOSIS — E1122 Type 2 diabetes mellitus with diabetic chronic kidney disease: Secondary | ICD-10-CM | POA: Diagnosis not present

## 2024-03-22 DIAGNOSIS — D631 Anemia in chronic kidney disease: Secondary | ICD-10-CM | POA: Diagnosis not present

## 2024-03-22 DIAGNOSIS — I130 Hypertensive heart and chronic kidney disease with heart failure and stage 1 through stage 4 chronic kidney disease, or unspecified chronic kidney disease: Secondary | ICD-10-CM | POA: Diagnosis not present

## 2024-03-22 DIAGNOSIS — N184 Chronic kidney disease, stage 4 (severe): Secondary | ICD-10-CM | POA: Diagnosis not present

## 2024-03-22 DIAGNOSIS — I5030 Unspecified diastolic (congestive) heart failure: Secondary | ICD-10-CM | POA: Diagnosis not present

## 2024-03-25 DIAGNOSIS — D631 Anemia in chronic kidney disease: Secondary | ICD-10-CM | POA: Diagnosis not present

## 2024-03-25 DIAGNOSIS — I5023 Acute on chronic systolic (congestive) heart failure: Secondary | ICD-10-CM | POA: Diagnosis not present

## 2024-03-25 DIAGNOSIS — N184 Chronic kidney disease, stage 4 (severe): Secondary | ICD-10-CM | POA: Diagnosis not present

## 2024-03-25 DIAGNOSIS — I130 Hypertensive heart and chronic kidney disease with heart failure and stage 1 through stage 4 chronic kidney disease, or unspecified chronic kidney disease: Secondary | ICD-10-CM | POA: Diagnosis not present

## 2024-03-25 DIAGNOSIS — E1122 Type 2 diabetes mellitus with diabetic chronic kidney disease: Secondary | ICD-10-CM | POA: Diagnosis not present

## 2024-03-25 DIAGNOSIS — I5030 Unspecified diastolic (congestive) heart failure: Secondary | ICD-10-CM | POA: Diagnosis not present

## 2024-03-28 DIAGNOSIS — N184 Chronic kidney disease, stage 4 (severe): Secondary | ICD-10-CM | POA: Diagnosis not present

## 2024-04-01 DIAGNOSIS — I5030 Unspecified diastolic (congestive) heart failure: Secondary | ICD-10-CM | POA: Diagnosis not present

## 2024-04-01 DIAGNOSIS — E1122 Type 2 diabetes mellitus with diabetic chronic kidney disease: Secondary | ICD-10-CM | POA: Diagnosis not present

## 2024-04-01 DIAGNOSIS — I130 Hypertensive heart and chronic kidney disease with heart failure and stage 1 through stage 4 chronic kidney disease, or unspecified chronic kidney disease: Secondary | ICD-10-CM | POA: Diagnosis not present

## 2024-04-01 DIAGNOSIS — D631 Anemia in chronic kidney disease: Secondary | ICD-10-CM | POA: Diagnosis not present

## 2024-04-01 DIAGNOSIS — N184 Chronic kidney disease, stage 4 (severe): Secondary | ICD-10-CM | POA: Diagnosis not present

## 2024-04-01 DIAGNOSIS — I5023 Acute on chronic systolic (congestive) heart failure: Secondary | ICD-10-CM | POA: Diagnosis not present

## 2024-04-02 ENCOUNTER — Other Ambulatory Visit: Payer: Self-pay | Admitting: Family Medicine

## 2024-04-03 DIAGNOSIS — I129 Hypertensive chronic kidney disease with stage 1 through stage 4 chronic kidney disease, or unspecified chronic kidney disease: Secondary | ICD-10-CM | POA: Diagnosis not present

## 2024-04-03 DIAGNOSIS — N184 Chronic kidney disease, stage 4 (severe): Secondary | ICD-10-CM | POA: Diagnosis not present

## 2024-04-03 DIAGNOSIS — M898X9 Other specified disorders of bone, unspecified site: Secondary | ICD-10-CM | POA: Diagnosis not present

## 2024-04-03 DIAGNOSIS — D631 Anemia in chronic kidney disease: Secondary | ICD-10-CM | POA: Diagnosis not present

## 2024-04-03 NOTE — Telephone Encounter (Signed)
 Sent. Thanks.

## 2024-04-03 NOTE — Telephone Encounter (Signed)
 LOV:02/08/24 NOV:05/13/24 LAST REFILL: allopurinol  (ZYLOPRIM ) 100 MG tablet

## 2024-04-12 ENCOUNTER — Emergency Department (HOSPITAL_COMMUNITY)

## 2024-04-12 ENCOUNTER — Other Ambulatory Visit: Payer: Self-pay

## 2024-04-12 ENCOUNTER — Inpatient Hospital Stay (HOSPITAL_COMMUNITY)
Admission: EM | Admit: 2024-04-12 | Discharge: 2024-04-16 | DRG: 183 | Disposition: A | Discharge status: Skilled Nursing Facility | Attending: Internal Medicine | Admitting: Internal Medicine

## 2024-04-12 ENCOUNTER — Ambulatory Visit: Payer: Self-pay

## 2024-04-12 DIAGNOSIS — N529 Male erectile dysfunction, unspecified: Secondary | ICD-10-CM | POA: Diagnosis present

## 2024-04-12 DIAGNOSIS — Z955 Presence of coronary angioplasty implant and graft: Secondary | ICD-10-CM | POA: Diagnosis not present

## 2024-04-12 DIAGNOSIS — M109 Gout, unspecified: Secondary | ICD-10-CM | POA: Diagnosis not present

## 2024-04-12 DIAGNOSIS — S2243XA Multiple fractures of ribs, bilateral, initial encounter for closed fracture: Secondary | ICD-10-CM | POA: Diagnosis not present

## 2024-04-12 DIAGNOSIS — S42131A Displaced fracture of coracoid process, right shoulder, initial encounter for closed fracture: Secondary | ICD-10-CM | POA: Diagnosis not present

## 2024-04-12 DIAGNOSIS — R2689 Other abnormalities of gait and mobility: Secondary | ICD-10-CM | POA: Diagnosis not present

## 2024-04-12 DIAGNOSIS — Z85828 Personal history of other malignant neoplasm of skin: Secondary | ICD-10-CM

## 2024-04-12 DIAGNOSIS — I4819 Other persistent atrial fibrillation: Secondary | ICD-10-CM | POA: Diagnosis present

## 2024-04-12 DIAGNOSIS — D649 Anemia, unspecified: Secondary | ICD-10-CM | POA: Diagnosis present

## 2024-04-12 DIAGNOSIS — I252 Old myocardial infarction: Secondary | ICD-10-CM

## 2024-04-12 DIAGNOSIS — S2241XA Multiple fractures of ribs, right side, initial encounter for closed fracture: Principal | ICD-10-CM

## 2024-04-12 DIAGNOSIS — H353230 Exudative age-related macular degeneration, bilateral, stage unspecified: Secondary | ICD-10-CM | POA: Diagnosis not present

## 2024-04-12 DIAGNOSIS — Y92000 Kitchen of unspecified non-institutional (private) residence as  the place of occurrence of the external cause: Secondary | ICD-10-CM | POA: Diagnosis not present

## 2024-04-12 DIAGNOSIS — I959 Hypotension, unspecified: Secondary | ICD-10-CM | POA: Diagnosis not present

## 2024-04-12 DIAGNOSIS — Z66 Do not resuscitate: Secondary | ICD-10-CM | POA: Diagnosis not present

## 2024-04-12 DIAGNOSIS — M19072 Primary osteoarthritis, left ankle and foot: Secondary | ICD-10-CM | POA: Diagnosis present

## 2024-04-12 DIAGNOSIS — I2510 Atherosclerotic heart disease of native coronary artery without angina pectoris: Secondary | ICD-10-CM | POA: Diagnosis not present

## 2024-04-12 DIAGNOSIS — E1151 Type 2 diabetes mellitus with diabetic peripheral angiopathy without gangrene: Secondary | ICD-10-CM | POA: Diagnosis present

## 2024-04-12 DIAGNOSIS — M1611 Unilateral primary osteoarthritis, right hip: Secondary | ICD-10-CM | POA: Diagnosis not present

## 2024-04-12 DIAGNOSIS — W19XXXA Unspecified fall, initial encounter: Principal | ICD-10-CM | POA: Diagnosis present

## 2024-04-12 DIAGNOSIS — E1122 Type 2 diabetes mellitus with diabetic chronic kidney disease: Secondary | ICD-10-CM | POA: Diagnosis not present

## 2024-04-12 DIAGNOSIS — K219 Gastro-esophageal reflux disease without esophagitis: Secondary | ICD-10-CM | POA: Diagnosis present

## 2024-04-12 DIAGNOSIS — L97519 Non-pressure chronic ulcer of other part of right foot with unspecified severity: Secondary | ICD-10-CM | POA: Diagnosis not present

## 2024-04-12 DIAGNOSIS — S3991XA Unspecified injury of abdomen, initial encounter: Secondary | ICD-10-CM | POA: Diagnosis not present

## 2024-04-12 DIAGNOSIS — Z7901 Long term (current) use of anticoagulants: Secondary | ICD-10-CM | POA: Diagnosis not present

## 2024-04-12 DIAGNOSIS — Z8701 Personal history of pneumonia (recurrent): Secondary | ICD-10-CM

## 2024-04-12 DIAGNOSIS — K5730 Diverticulosis of large intestine without perforation or abscess without bleeding: Secondary | ICD-10-CM | POA: Diagnosis present

## 2024-04-12 DIAGNOSIS — D696 Thrombocytopenia, unspecified: Secondary | ICD-10-CM | POA: Diagnosis not present

## 2024-04-12 DIAGNOSIS — M19071 Primary osteoarthritis, right ankle and foot: Secondary | ICD-10-CM | POA: Diagnosis present

## 2024-04-12 DIAGNOSIS — J9811 Atelectasis: Secondary | ICD-10-CM | POA: Diagnosis present

## 2024-04-12 DIAGNOSIS — W010XXA Fall on same level from slipping, tripping and stumbling without subsequent striking against object, initial encounter: Secondary | ICD-10-CM | POA: Diagnosis present

## 2024-04-12 DIAGNOSIS — M25511 Pain in right shoulder: Secondary | ICD-10-CM | POA: Diagnosis present

## 2024-04-12 DIAGNOSIS — I739 Peripheral vascular disease, unspecified: Secondary | ICD-10-CM

## 2024-04-12 DIAGNOSIS — S0990XA Unspecified injury of head, initial encounter: Secondary | ICD-10-CM | POA: Diagnosis not present

## 2024-04-12 DIAGNOSIS — Z952 Presence of prosthetic heart valve: Secondary | ICD-10-CM

## 2024-04-12 DIAGNOSIS — M170 Bilateral primary osteoarthritis of knee: Secondary | ICD-10-CM | POA: Diagnosis present

## 2024-04-12 DIAGNOSIS — I5023 Acute on chronic systolic (congestive) heart failure: Secondary | ICD-10-CM | POA: Diagnosis not present

## 2024-04-12 DIAGNOSIS — L89152 Pressure ulcer of sacral region, stage 2: Secondary | ICD-10-CM | POA: Diagnosis present

## 2024-04-12 DIAGNOSIS — N184 Chronic kidney disease, stage 4 (severe): Secondary | ICD-10-CM

## 2024-04-12 DIAGNOSIS — R278 Other lack of coordination: Secondary | ICD-10-CM | POA: Diagnosis not present

## 2024-04-12 DIAGNOSIS — L509 Urticaria, unspecified: Secondary | ICD-10-CM | POA: Diagnosis not present

## 2024-04-12 DIAGNOSIS — Z9049 Acquired absence of other specified parts of digestive tract: Secondary | ICD-10-CM

## 2024-04-12 DIAGNOSIS — I130 Hypertensive heart and chronic kidney disease with heart failure and stage 1 through stage 4 chronic kidney disease, or unspecified chronic kidney disease: Secondary | ICD-10-CM | POA: Diagnosis present

## 2024-04-12 DIAGNOSIS — I700 Atherosclerosis of aorta: Secondary | ICD-10-CM | POA: Diagnosis present

## 2024-04-12 DIAGNOSIS — Z833 Family history of diabetes mellitus: Secondary | ICD-10-CM

## 2024-04-12 DIAGNOSIS — E785 Hyperlipidemia, unspecified: Secondary | ICD-10-CM | POA: Diagnosis present

## 2024-04-12 DIAGNOSIS — E11621 Type 2 diabetes mellitus with foot ulcer: Secondary | ICD-10-CM | POA: Diagnosis present

## 2024-04-12 DIAGNOSIS — N179 Acute kidney failure, unspecified: Secondary | ICD-10-CM | POA: Diagnosis present

## 2024-04-12 DIAGNOSIS — I5030 Unspecified diastolic (congestive) heart failure: Secondary | ICD-10-CM | POA: Diagnosis not present

## 2024-04-12 DIAGNOSIS — S2231XA Fracture of one rib, right side, initial encounter for closed fracture: Secondary | ICD-10-CM | POA: Diagnosis not present

## 2024-04-12 DIAGNOSIS — H9190 Unspecified hearing loss, unspecified ear: Secondary | ICD-10-CM | POA: Diagnosis present

## 2024-04-12 DIAGNOSIS — Z974 Presence of external hearing-aid: Secondary | ICD-10-CM

## 2024-04-12 DIAGNOSIS — I4510 Unspecified right bundle-branch block: Secondary | ICD-10-CM | POA: Diagnosis present

## 2024-04-12 DIAGNOSIS — Z9842 Cataract extraction status, left eye: Secondary | ICD-10-CM

## 2024-04-12 DIAGNOSIS — Z860100 Personal history of colon polyps, unspecified: Secondary | ICD-10-CM

## 2024-04-12 DIAGNOSIS — Z888 Allergy status to other drugs, medicaments and biological substances status: Secondary | ICD-10-CM

## 2024-04-12 DIAGNOSIS — D539 Nutritional anemia, unspecified: Secondary | ICD-10-CM | POA: Diagnosis present

## 2024-04-12 DIAGNOSIS — S199XXA Unspecified injury of neck, initial encounter: Secondary | ICD-10-CM | POA: Diagnosis not present

## 2024-04-12 DIAGNOSIS — I5022 Chronic systolic (congestive) heart failure: Secondary | ICD-10-CM | POA: Diagnosis not present

## 2024-04-12 DIAGNOSIS — I10 Essential (primary) hypertension: Secondary | ICD-10-CM | POA: Diagnosis not present

## 2024-04-12 DIAGNOSIS — Z8249 Family history of ischemic heart disease and other diseases of the circulatory system: Secondary | ICD-10-CM

## 2024-04-12 DIAGNOSIS — Z961 Presence of intraocular lens: Secondary | ICD-10-CM | POA: Diagnosis present

## 2024-04-12 DIAGNOSIS — M6281 Muscle weakness (generalized): Secondary | ICD-10-CM | POA: Diagnosis not present

## 2024-04-12 DIAGNOSIS — Z743 Need for continuous supervision: Secondary | ICD-10-CM | POA: Diagnosis not present

## 2024-04-12 DIAGNOSIS — Z79899 Other long term (current) drug therapy: Secondary | ICD-10-CM

## 2024-04-12 DIAGNOSIS — J90 Pleural effusion, not elsewhere classified: Secondary | ICD-10-CM | POA: Diagnosis not present

## 2024-04-12 DIAGNOSIS — I7090 Unspecified atherosclerosis: Secondary | ICD-10-CM | POA: Diagnosis not present

## 2024-04-12 DIAGNOSIS — Z951 Presence of aortocoronary bypass graft: Secondary | ICD-10-CM

## 2024-04-12 DIAGNOSIS — R0989 Other specified symptoms and signs involving the circulatory and respiratory systems: Secondary | ICD-10-CM | POA: Diagnosis not present

## 2024-04-12 DIAGNOSIS — M25551 Pain in right hip: Secondary | ICD-10-CM | POA: Diagnosis not present

## 2024-04-12 DIAGNOSIS — E1142 Type 2 diabetes mellitus with diabetic polyneuropathy: Secondary | ICD-10-CM | POA: Diagnosis present

## 2024-04-12 DIAGNOSIS — Z8261 Family history of arthritis: Secondary | ICD-10-CM

## 2024-04-12 DIAGNOSIS — G4733 Obstructive sleep apnea (adult) (pediatric): Secondary | ICD-10-CM | POA: Diagnosis present

## 2024-04-12 DIAGNOSIS — D631 Anemia in chronic kidney disease: Secondary | ICD-10-CM | POA: Diagnosis present

## 2024-04-12 DIAGNOSIS — I501 Left ventricular failure: Secondary | ICD-10-CM | POA: Diagnosis not present

## 2024-04-12 DIAGNOSIS — Z87891 Personal history of nicotine dependence: Secondary | ICD-10-CM

## 2024-04-12 DIAGNOSIS — R079 Chest pain, unspecified: Secondary | ICD-10-CM | POA: Diagnosis not present

## 2024-04-12 DIAGNOSIS — Z7902 Long term (current) use of antithrombotics/antiplatelets: Secondary | ICD-10-CM

## 2024-04-12 DIAGNOSIS — Z886 Allergy status to analgesic agent status: Secondary | ICD-10-CM

## 2024-04-12 DIAGNOSIS — M19042 Primary osteoarthritis, left hand: Secondary | ICD-10-CM | POA: Diagnosis present

## 2024-04-12 DIAGNOSIS — S2241XD Multiple fractures of ribs, right side, subsequent encounter for fracture with routine healing: Secondary | ICD-10-CM | POA: Diagnosis not present

## 2024-04-12 DIAGNOSIS — Z841 Family history of disorders of kidney and ureter: Secondary | ICD-10-CM

## 2024-04-12 DIAGNOSIS — G4730 Sleep apnea, unspecified: Secondary | ICD-10-CM | POA: Diagnosis not present

## 2024-04-12 DIAGNOSIS — I482 Chronic atrial fibrillation, unspecified: Secondary | ICD-10-CM | POA: Diagnosis not present

## 2024-04-12 DIAGNOSIS — G319 Degenerative disease of nervous system, unspecified: Secondary | ICD-10-CM | POA: Diagnosis not present

## 2024-04-12 DIAGNOSIS — I272 Pulmonary hypertension, unspecified: Secondary | ICD-10-CM | POA: Diagnosis not present

## 2024-04-12 DIAGNOSIS — Z9841 Cataract extraction status, right eye: Secondary | ICD-10-CM

## 2024-04-12 DIAGNOSIS — Z7984 Long term (current) use of oral hypoglycemic drugs: Secondary | ICD-10-CM

## 2024-04-12 DIAGNOSIS — I4891 Unspecified atrial fibrillation: Secondary | ICD-10-CM | POA: Diagnosis not present

## 2024-04-12 DIAGNOSIS — M19041 Primary osteoarthritis, right hand: Secondary | ICD-10-CM | POA: Diagnosis present

## 2024-04-12 DIAGNOSIS — R739 Hyperglycemia, unspecified: Secondary | ICD-10-CM | POA: Diagnosis not present

## 2024-04-12 DIAGNOSIS — I517 Cardiomegaly: Secondary | ICD-10-CM | POA: Diagnosis not present

## 2024-04-12 DIAGNOSIS — I6523 Occlusion and stenosis of bilateral carotid arteries: Secondary | ICD-10-CM | POA: Diagnosis not present

## 2024-04-12 DIAGNOSIS — J310 Chronic rhinitis: Secondary | ICD-10-CM | POA: Diagnosis not present

## 2024-04-12 LAB — CBC
HCT: 29.9 % — ABNORMAL LOW (ref 39.0–52.0)
Hemoglobin: 9.3 g/dL — ABNORMAL LOW (ref 13.0–17.0)
MCH: 32.1 pg (ref 26.0–34.0)
MCHC: 31.1 g/dL (ref 30.0–36.0)
MCV: 103.1 fL — ABNORMAL HIGH (ref 80.0–100.0)
Platelets: 141 10*3/uL — ABNORMAL LOW (ref 150–400)
RBC: 2.9 MIL/uL — ABNORMAL LOW (ref 4.22–5.81)
RDW: 16 % — ABNORMAL HIGH (ref 11.5–15.5)
WBC: 6.1 10*3/uL (ref 4.0–10.5)
nRBC: 0 % (ref 0.0–0.2)

## 2024-04-12 LAB — I-STAT CHEM 8, ED
BUN: 36 mg/dL — ABNORMAL HIGH (ref 8–23)
Calcium, Ion: 1.13 mmol/L — ABNORMAL LOW (ref 1.15–1.40)
Chloride: 100 mmol/L (ref 98–111)
Creatinine, Ser: 3.2 mg/dL — ABNORMAL HIGH (ref 0.61–1.24)
Glucose, Bld: 245 mg/dL — ABNORMAL HIGH (ref 70–99)
HCT: 29 % — ABNORMAL LOW (ref 39.0–52.0)
Hemoglobin: 9.9 g/dL — ABNORMAL LOW (ref 13.0–17.0)
Potassium: 4.4 mmol/L (ref 3.5–5.1)
Sodium: 135 mmol/L (ref 135–145)
TCO2: 24 mmol/L (ref 22–32)

## 2024-04-12 LAB — COMPREHENSIVE METABOLIC PANEL WITH GFR
ALT: 13 U/L (ref 0–44)
AST: 21 U/L (ref 15–41)
Albumin: 3.3 g/dL — ABNORMAL LOW (ref 3.5–5.0)
Alkaline Phosphatase: 69 U/L (ref 38–126)
Anion gap: 14 (ref 5–15)
BUN: 39 mg/dL — ABNORMAL HIGH (ref 8–23)
CO2: 23 mmol/L (ref 22–32)
Calcium: 9.3 mg/dL (ref 8.9–10.3)
Chloride: 98 mmol/L (ref 98–111)
Creatinine, Ser: 3.28 mg/dL — ABNORMAL HIGH (ref 0.61–1.24)
GFR, Estimated: 17 mL/min — ABNORMAL LOW (ref >60)
Glucose, Bld: 248 mg/dL — ABNORMAL HIGH (ref 70–99)
Potassium: 4.4 mmol/L (ref 3.5–5.1)
Sodium: 135 mmol/L (ref 135–145)
Total Bilirubin: 0.9 mg/dL (ref 0.0–1.2)
Total Protein: 6.8 g/dL (ref 6.5–8.1)

## 2024-04-12 LAB — BRAIN NATRIURETIC PEPTIDE: B Natriuretic Peptide: 2960.9 pg/mL — ABNORMAL HIGH (ref 0.0–100.0)

## 2024-04-12 LAB — GLUCOSE, CAPILLARY: Glucose-Capillary: 173 mg/dL — ABNORMAL HIGH (ref 70–99)

## 2024-04-12 MED ORDER — ISOSORBIDE MONONITRATE ER 60 MG PO TB24
90.0000 mg | ORAL_TABLET | Freq: Every day | ORAL | Status: DC
  Administered 2024-04-13 – 2024-04-16 (×4): 90 mg via ORAL
  Filled 2024-04-12 (×4): qty 1

## 2024-04-12 MED ORDER — ONDANSETRON HCL 4 MG/2ML IJ SOLN: 4.0000 mg | Freq: Four times a day (QID) | INTRAMUSCULAR | Status: DC | PRN

## 2024-04-12 MED ORDER — HYDRALAZINE HCL 25 MG PO TABS
25.0000 mg | ORAL_TABLET | Freq: Every day | ORAL | Status: DC
  Administered 2024-04-13 – 2024-04-16 (×4): 25 mg via ORAL
  Filled 2024-04-12 (×4): qty 1

## 2024-04-12 MED ORDER — LIDOCAINE 5 % EX PTCH
1.0000 | MEDICATED_PATCH | CUTANEOUS | Status: DC
  Administered 2024-04-12 – 2024-04-15 (×3): 1 via TRANSDERMAL
  Filled 2024-04-12 (×3): qty 1

## 2024-04-12 MED ORDER — ACETAMINOPHEN 325 MG PO TABS: 650.0000 mg | ORAL_TABLET | Freq: Four times a day (QID) | ORAL | Status: DC | PRN

## 2024-04-12 MED ORDER — OMEGA-3 FATTY ACIDS 1000 MG PO CAPS: 1.0000 g | ORAL_CAPSULE | Freq: Every day | ORAL | Status: DC

## 2024-04-12 MED ORDER — APIXABAN 2.5 MG PO TABS
2.5000 mg | ORAL_TABLET | Freq: Two times a day (BID) | ORAL | Status: DC
  Administered 2024-04-13 – 2024-04-16 (×7): 2.5 mg via ORAL
  Filled 2024-04-12 (×8): qty 1

## 2024-04-12 MED ORDER — METOPROLOL SUCCINATE ER 50 MG PO TB24
100.0000 mg | ORAL_TABLET | Freq: Every day | ORAL | Status: DC
  Administered 2024-04-13 – 2024-04-16 (×4): 100 mg via ORAL
  Filled 2024-04-12 (×4): qty 2

## 2024-04-12 MED ORDER — POTASSIUM GLUCONATE 595 MG PO CAPS: 595.0000 mg | ORAL_CAPSULE | Freq: Every morning | ORAL | Status: DC

## 2024-04-12 MED ORDER — EMPAGLIFLOZIN 10 MG PO TABS
10.0000 mg | ORAL_TABLET | Freq: Every day | ORAL | Status: DC
  Administered 2024-04-13 – 2024-04-16 (×4): 10 mg via ORAL
  Filled 2024-04-12 (×4): qty 1

## 2024-04-12 MED ORDER — IOHEXOL 350 MG/ML SOLN
60.0000 mL | Freq: Once | INTRAVENOUS | Status: AC | PRN
Start: 2024-04-12 — End: 2024-04-12
  Administered 2024-04-12: 60 mL via INTRAVENOUS

## 2024-04-12 MED ORDER — SENNOSIDES-DOCUSATE SODIUM 8.6-50 MG PO TABS: 1.0000 | ORAL_TABLET | Freq: Every evening | ORAL | Status: DC | PRN

## 2024-04-12 MED ORDER — POLYETHYLENE GLYCOL 3350 17 G PO PACK
17.0000 g | PACK | Freq: Every day | ORAL | Status: DC
  Administered 2024-04-13 – 2024-04-16 (×4): 17 g via ORAL
  Filled 2024-04-12 (×4): qty 1

## 2024-04-12 MED ORDER — FUROSEMIDE 10 MG/ML IJ SOLN
40.0000 mg | Freq: Every day | INTRAMUSCULAR | Status: DC
  Administered 2024-04-12 – 2024-04-14 (×3): 40 mg via INTRAVENOUS
  Filled 2024-04-12 (×4): qty 4

## 2024-04-12 MED ORDER — ALLOPURINOL 100 MG PO TABS
50.0000 mg | ORAL_TABLET | ORAL | Status: DC
  Administered 2024-04-15: 50 mg via ORAL
  Filled 2024-04-12: qty 1

## 2024-04-12 MED ORDER — IPRATROPIUM BROMIDE 0.06 % NA SOLN: 2.0000 | Freq: Two times a day (BID) | NASAL | Status: DC | PRN

## 2024-04-12 MED ORDER — RANOLAZINE ER 500 MG PO TB12
500.0000 mg | ORAL_TABLET | Freq: Two times a day (BID) | ORAL | Status: DC
  Administered 2024-04-13 – 2024-04-15 (×6): 500 mg via ORAL
  Filled 2024-04-12 (×7): qty 1

## 2024-04-12 MED ORDER — OMEGA-3-ACID ETHYL ESTERS 1 G PO CAPS
1.0000 g | ORAL_CAPSULE | Freq: Every day | ORAL | Status: DC
  Administered 2024-04-13 – 2024-04-15 (×4): 1 g via ORAL
  Filled 2024-04-12 (×5): qty 1

## 2024-04-12 MED ORDER — HYDROMORPHONE HCL 1 MG/ML IJ SOLN: 0.5000 mg | Freq: Four times a day (QID) | INTRAMUSCULAR | Status: DC | PRN

## 2024-04-12 MED ORDER — ONDANSETRON HCL 4 MG PO TABS: 4.0000 mg | ORAL_TABLET | Freq: Four times a day (QID) | ORAL | Status: DC | PRN

## 2024-04-12 MED ORDER — ACETAMINOPHEN 650 MG RE SUPP: 650.0000 mg | Freq: Four times a day (QID) | RECTAL | Status: DC | PRN

## 2024-04-12 MED ORDER — HYDROMORPHONE HCL 1 MG/ML IJ SOLN
0.2500 mg | Freq: Once | INTRAMUSCULAR | Status: AC
  Administered 2024-04-12: 0.25 mg via INTRAVENOUS
  Filled 2024-04-12: qty 1

## 2024-04-12 MED ORDER — MAGNESIUM OXIDE -MG SUPPLEMENT 400 (240 MG) MG PO TABS
400.0000 mg | ORAL_TABLET | ORAL | Status: DC
  Administered 2024-04-13 – 2024-04-15 (×2): 400 mg via ORAL
  Filled 2024-04-12 (×3): qty 1

## 2024-04-12 MED ORDER — POTASSIUM CHLORIDE CRYS ER 20 MEQ PO TBCR
20.0000 meq | EXTENDED_RELEASE_TABLET | Freq: Every day | ORAL | Status: DC
  Administered 2024-04-13 – 2024-04-16 (×4): 20 meq via ORAL
  Filled 2024-04-12 (×4): qty 1

## 2024-04-12 MED ORDER — ROSUVASTATIN CALCIUM 5 MG PO TABS
10.0000 mg | ORAL_TABLET | Freq: Every day | ORAL | Status: DC
  Administered 2024-04-13 – 2024-04-16 (×4): 10 mg via ORAL
  Filled 2024-04-12 (×4): qty 2

## 2024-04-12 MED ORDER — VITAMIN D 25 MCG (1000 UNIT) PO TABS
1000.0000 [IU] | ORAL_TABLET | Freq: Every day | ORAL | Status: DC
  Administered 2024-04-13 – 2024-04-15 (×4): 1000 [IU] via ORAL
  Filled 2024-04-12 (×5): qty 1

## 2024-04-12 NOTE — ED Provider Notes (Signed)
 [place] [facility] AT [facility] Provider Note   CSN: [id number] Arrival date & time: 04/12/24  1349     History  Chief Complaint  Patient presents with   Fall    fot    MEKEL HAVERSTOCK is a 89 y.o. male.   Fall  Patient presents as a level 2 trauma.  On anticoagulation.  Fell backwards and hit his head.  Just lost his balance.  Planing of some pain in the right hip and now right abdomen.  No loss consciousness.  Appears to be on Eliquis  and Plavix .     Home Medications Prior to Admission medications   Medication Sig Start Date End Date Taking? Authorizing Provider  acetaminophen  (TYLENOL ) 325 MG tablet Take 2 tablets (650 mg total) by mouth every 6 (six) hours as needed for mild pain (pain score 1-3) (or Fever >/= 101). 02/03/24   Aura Leeds [place], DO  allopurinol  (ZYLOPRIM ) 100 MG tablet Take 0.5 tablets (50 mg total) by mouth every Monday, Wednesday, and Friday. 04/03/24   Donnie Galea, MD  Cholecalciferol  (VITAMIN D ) 1000 UNITS capsule Take 1,000 Units by mouth at bedtime.    [provider]  clopidogrel  (PLAVIX ) 75 MG tablet take 1 tablet by mouth daily. 02/27/24   Arnoldo Lapping, MD  ELIQUIS  2.5 MG TABS tablet TAKE 1 TABLET TWICE A DAY 12/04/23   Avanell Leigh, MD  fish oil-omega-3 fatty acids 1000 MG capsule Take 1 g by mouth at bedtime.    [provider]  glucose blood (FREESTYLE LITE) test strip USE TO TEST BLOOD SUGAR ONCE DAILY AND AS NEEDED 11/23/23   Donnie Galea, MD  hydrALAZINE  (APRESOLINE ) 25 MG tablet Take 1 tablet (25 mg total) by mouth daily. 12/02/22   Avanell Leigh, MD  HYDROcodone -acetaminophen  (NORCO/VICODIN) 5-325 MG tablet Take 0.5-1 tablets by mouth 2 (two) times daily as needed for moderate pain (pain score 4-6). Patient not taking: Reported on 03/19/2024 01/26/24   Donnie Galea, MD  hydrOXYzine  (VISTARIL ) 25 MG capsule Take 1 capsule (25 mg total) by mouth every 8 (eight) hours as needed. 02/12/24    Jolanda Nation, NP  ipratropium (ATROVENT ) 0.03 % nasal spray USE 2 SPRAYS IN EACH NOSTRIL TWICE A DAY AS NEEDED FOR RHINITIS 03/08/24   Donnie Galea, MD  isosorbide  mononitrate (IMDUR ) 60 MG 24 hr tablet Take 1.5 tablets (90 mg total) by mouth daily. 12/19/23   Avanell Leigh, MD  JARDIANCE  10 MG TABS tablet Take 10 mg by mouth daily. 03/11/24   [provider]  magnesium  oxide (MAG-OX) 400 MG tablet Take 1 tablet (400 mg total) by mouth every other day. 04/13/23   Donnie Galea, MD  metoprolol  succinate (TOPROL -XL) 100 MG 24 hr tablet Take 1 tablet (100 mg total) by mouth daily. Take with or immediately following a meal. 02/27/24   Arnoldo Lapping, MD  nitroGLYCERIN  (NITROSTAT ) 0.4 MG SL tablet DISSOLVE 1 TABLET UNDER THE TONGUE EVERY 5 MINUTES AS NEEDED FOR CHEST PAIN FOR A MAXIMUM OF 3 DOSES Patient not taking: Reported on 03/19/2024 07/01/22   Avanell Leigh, MD  polyethylene glycol (MIRALAX  / GLYCOLAX ) 17 g packet Take 17 g by mouth daily.    [provider]  Potassium Gluconate 595 MG CAPS Take 595 mg by mouth in the morning.    [provider]  ranolazine  (RANEXA ) 500 MG 12 hr tablet TAKE 1 TABLET TWICE A DAY 03/06/24   Arnoldo Lapping, MD  rosuvastatin  (CRESTOR ) 10 MG tablet TAKE 1 TABLET DAILY 02/07/24   Avanell Leigh, MD  torsemide  (DEMADEX ) 20 MG tablet Take 1 tab daily in the AM if weight is increasing. Patient not taking: Reported on 03/19/2024 02/16/24   Donnie Galea, MD      Allergies    Brilinta  [ticagrelor ] and Nsaids    Review of Systems   Review of Systems  Physical Exam Updated Vital Signs BP 101/65   Pulse 95   Resp 17   Ht 5\' 7"  (1.702 m)   Wt 72.1 kg   SpO2 95%   BMI 24.90 kg/m  Physical Exam Vitals reviewed.  HENT:     Head: Normocephalic.     Comments: Slight right temporal tenderness. Cardiovascular:     Rate and Rhythm: Regular rhythm.  Pulmonary:     Breath sounds: No wheezing.     Comments: Tenderness to right  lateral lower chest wall. Chest:     Chest wall: Tenderness present.  Abdominal:     Tenderness: There is abdominal tenderness.     Comments: Right upper quadrant tenderness without rebound or guarding.  Musculoskeletal:     Cervical back: Neck supple.     Comments: Some tenderness right hip laterally.  Good range of motion however.  Neurological:     Mental Status: He is alert and oriented to person, place, and time.     ED Results / Procedures / Treatments   Labs (all labs ordered are listed, but only abnormal results are displayed) Labs Reviewed  COMPREHENSIVE METABOLIC PANEL WITH GFR - Abnormal; Notable for the following components:      Result Value   Glucose, Bld 248 (*)    BUN 39 (*)    Creatinine, Ser 3.28 (*)    Albumin 3.3 (*)    GFR, Estimated 17 (*)    All other components within normal limits  CBC - Abnormal; Notable for the following components:   RBC 2.90 (*)    Hemoglobin 9.3 (*)    HCT 29.9 (*)    MCV 103.1 (*)    RDW 16.0 (*)    Platelets 141 (*)    All other components within normal limits  I-STAT CHEM 8, ED - Abnormal; Notable for the following components:   BUN 36 (*)    Creatinine, Ser 3.20 (*)    Glucose, Bld 245 (*)    Calcium , Ion 1.13 (*)    Hemoglobin 9.9 (*)    HCT 29.0 (*)    All other components within normal limits    EKG None  Radiology No results found.  Procedures Procedures    Medications Ordered in ED Medications  iohexol  (OMNIPAQUE ) 350 MG/ML injection 60 mL (60 mLs Intravenous Contrast Given 04/12/24 1440)    ED Course/ Medical Decision Making/ A&P                                 Medical Decision Making Amount and/or Complexity of Data Reviewed Labs: ordered. Radiology: ordered.  Risk Prescription drug management.   Patient presented as a level 2 trauma.  Fall.  Did hit head.  Also right side abdominal pain.  Differential diagnosis does include injury such as hepatic laceration.  Will get head CT cervical  spine T chest abdomen pelvis CT.  Attempted FAST exam, however ultrasound probe did not have enough contrast to be able to visualize.  May be problem with machine.  CT scan still pending.  Creatinine is elevated, however with mild hypotension and hemoglobin slightly decreased I think it is worth the benefit over the risks to get the contrast CT.  On my troponin of CT does have pleural effusion on the right side.  Although somewhat homogeneous.  Does have rib fracture.  Does have small amount of fluid in the pelvis also.  No hepatic injury seen.  Discussed with Dr. Hildy Lowers from trauma surgery.  He also had reviewed the imaging and is of similar opinion.  Will wait on official radiology read at this time.  Care turned over to Dr. Drury Geralds.   CRITICAL CARE Performed by: Mozell Arias Total critical care time: 30 minutes Critical care time was exclusive of separately billable procedures and treating other patients. Critical care was necessary to treat or prevent imminent or life-threatening deterioration. Critical care was time spent personally by me on the following activities: development of treatment plan with patient and/or surrogate as well as nursing, discussions with consultants, evaluation of patient's response to treatment, examination of patient, obtaining history from patient or surrogate, ordering and performing treatments and interventions, ordering and review of laboratory studies, ordering and review of radiographic studies, pulse oximetry and re-evaluation of patient's condition.         Final Clinical Impression(s) / ED Diagnoses Final diagnoses:  Fall, initial encounter  Closed fracture of one rib of right side, initial encounter    Rx / DC Orders ED Discharge Orders     None         Mozell Arias, MD 04/12/24 1559

## 2024-04-12 NOTE — ED Provider Notes (Signed)
 4:31 PM Assumed care of patient from off-going team. For more details, please see note from same day.  In brief, this is a 89 y.o. male who presented as level 2 FoT, lost balance, fell backwards and hit his head. Hgb went from 9.8-->9.3 over last few months. On eliquis /plavix . CKD at baseline. CTH/CT C_spine unremarkable.   Plan/Dispo at time of sign-out & ED Course since sign-out: [ ]  CT C/A/P waiting on read but seems like rib fracture and pleural effusion vs hemothorax. Discussed with Dr. Dorena Gander who was made aware of him already.  BP 101/65   Pulse 95   Resp 17   Ht 5\' 7"  (1.702 m)   Wt 72.1 kg   SpO2 95%   BMI 24.90 kg/m    ED Course:   Clinical Course as of 04/12/24 1952  Fri Apr 12, 2024  1724 DG Hip Unilat W or Wo Pelvis 2-3 Views Right 1. No fracture of the pelvis or right hip. 2. Mild degenerative change of the right hip.   [HN]  1725 DG Chest Portable 1 View 1. Small to moderate right pleural effusion, increased from prior exam. 2. Low lung volumes. 3. Stable cardiomegaly.   [HN]  1807 CT CHEST ABDOMEN PELVIS W CONTRAST 1. Nondisplaced fracture through the right coracoid process with nondisplaced fractures of the posterior right tenth rib with buckle type fractures of the anterior right seventh and eighth ribs. 2. Moderate volume right pleural effusion with compressive atelectasis. Trace left pleural effusion. 3. Diffuse anasarca. Small volume free fluid in the pelvis with presacral edema. In the setting of cardiomegaly, these findings are likely related to patient's volume status. 4. Circumferential wall thickening of the urinary bladder, which may be due to underdistension. If there is concern for acute cystitis, correlation with urinalysis would be recommended. 5. Descending colonic diverticulosis. No changes of acute diverticulitis.  Aortic Atherosclerosis (ICD10-I70.0).   [HN]  1813 Adding on UA d/t imaging findings as well as BNP. Patient is  SORA, not in any resp distress. I do believe he would be best served by admission for pain management and reeval. [HN]  1814 Repaged top trauma surgery. [HN]  1824 Trauma surgery will consult on patient. Admitting to medicine. [HN]    Clinical Course User Index [HN] Merdis Stalling, MD    Dispo: Admit to medicine. Ordered incentive spirometry and pain control.  ------------------------------- Annita Kindle, MD Emergency Medicine  This note was created using dictation software, which may contain spelling or grammatical errors.   Merdis Stalling, MD 04/12/24 [phone]

## 2024-04-12 NOTE — Telephone Encounter (Signed)
 Would get checked at [facility] today.  They will likely talk to patient about short term plan re: torsemide  use/dose.   Please let us  know about his status and torsemide  use/response on Monday so we can make plans at that point.  Thanks.

## 2024-04-12 NOTE — Consult Note (Signed)
 Reason for Consult:rib fractures Referring Physician: Annita Kindle  [name] is an 89 y.o. male.  HPI: 89yo M with PMHx as below presented to the ED S/P fall on anticoagulation. He was making oatmeal and lost his footing falling on his R side. W/U revealed R rib FX 7,8,10 along with a chronic looking R pleural effusion. He is being admitted by [facility] and I was asked to consult regarding the rib FXs.   Past Medical History:  Diagnosis Date   Age-related macular degeneration, wet, both eyes ([name])    Anemia    Secondary to acute blood loss   Anginal pain ([name])    last chest pain in Feb 2021   Arthritis    "mild in hands, knees, ankles" (11/13/2015)   Atrial fibrillation ([name])    Consideration was given for atrial flutter ablation, but patient developed atrial fibrillation. Cardioversion was done. Dr. Rodolfo Clan decided to watch him clinically. November, 2011   Atrial flutter [facility]) 07/2010   September, 2011   Hospital with PNA and cath done...Coumadin .  Atrial flutter ablation planned, but  pt. then had atrial fibrillation,/outpatient conversion 09/08/10..NSR..plan to follow..Dr. Rodolfo Clan   CAD (coronary artery disease)    Catheterization, September, 2011,  grafts patent from redo CABG,, medical therapy of coronary disease, consideration to proceeding with atrial flutter ablation   Carotid artery disease ([name])    Doppler 09/18/2009 - 49% bilateral stenoses   Carotid artery disease ([name])    49% bilateral, Doppler, November, 2010   CHF (congestive heart failure) ([name])    Chronic kidney disease (CKD), stage III (moderate) ([name])    Diabetic peripheral neuropathy ([name])    feet   Diverticulosis of colon with hemorrhage 2009   several unit diverticular bleed    Erectile dysfunction    Mild   Gout    Hearing loss    wears hearing aids   History of blood transfusion "several"   related to diverticular bleeding   Hyperlipidemia    Hypertension    Mitral regurgitation    Mild, echo, September,  2011   NSTEMI (non-ST elevated myocardial infarction) ([name]) 07/2010   at [facility] with repeat cath, rec medical mgmt    OSA on CPAP    uses CPAP nightly   Personal history of colonic polyps    PNA (pneumonia) 07/2010   NSTEMI at [facility] with repeat cath, rec medical mgmt    RBBB (right bundle branch block)    S/P mitral valve repair 02/06/2020   s/p TEER with a single MitraClip NTW placed on A2/P2   Shoulder pain    "positional; better now" (11/13/2015)   Skin cancer    R lower leg, per derm 2012   Type II diabetes mellitus ([name])     Past Surgical History:  Procedure Laterality Date   CARDIAC CATHETERIZATION  2006   Nuclear..slight lateral ischemia..medical therapy   CARDIAC CATHETERIZATION  08/04/2010   grafts patent from redo CABG...medical Rx and ablate Atrial flutter (LV not injected)    CARDIOVERSION  ~ 2010   CAROTID ENDARTERECTOMY Right 1994   CATARACT EXTRACTION W/ INTRAOCULAR LENS  IMPLANT, BILATERAL Bilateral    CHOLECYSTECTOMY     COLON RESECTION N/A 01/13/2016   Procedure: EXPLORATORY LAPAROTOMY, LEFT AND SIGMOID COLON REMOVAL;  Surgeon: Shela Derby, MD;  Location: [facility] OR;  Service: General;  Laterality: N/A;  Extended open left hemicolectomy and sigmoidectomy    COLONOSCOPY N/A 11/14/2015   Procedure: COLONOSCOPY;  Surgeon: Danette Duos, MD;  Location:  [facility] ENDOSCOPY;  Service: Gastroenterology;  Laterality: N/A;   COLONOSCOPY Left 01/05/2016   Procedure: COLONOSCOPY;  Surgeon: Danette Duos, MD;  Location: [facility] ENDOSCOPY;  Service: Gastroenterology;  Laterality: Left;  no sedation to start, moderate if needed   COLONOSCOPY W/ POLYPECTOMY     CORONARY ARTERY BYPASS GRAFT  1995; 2006   "X 3; X3"   CORONARY ARTERY BYPASS GRAFT  1995, 2006   CORONARY STENT INTERVENTION N/A 02/02/2024   Procedure: CORONARY STENT INTERVENTION;  Surgeon: Arnoldo Lapping, MD;  Location: [facility] INVASIVE CV LAB;  Service: Cardiovascular;  Laterality: N/A;   DOPPLER ECHOCARDIOGRAPHY   08/2008   EF 60%   DOPPLER ECHOCARDIOGRAPHY  08/02/2010   65-70%   DOPPLER ECHOCARDIOGRAPHY  07/2010   MR mild   ESOPHAGOGASTRODUODENOSCOPY N/A 06/19/2014   Procedure: ESOPHAGOGASTRODUODENOSCOPY (EGD);  Surgeon: Nannette Babe, MD;  Location: [facility] ENDOSCOPY;  Service: Endoscopy;  Laterality: N/A;   ESOPHAGOGASTRODUODENOSCOPY N/A 11/14/2015   Procedure: ESOPHAGOGASTRODUODENOSCOPY (EGD);  Surgeon: Danette Duos, MD;  Location: [facility] ENDOSCOPY;  Service: Gastroenterology;  Laterality: N/A;   ESOPHAGOGASTRODUODENOSCOPY (EGD) WITH PROPOFOL  N/A 08/18/2023   Procedure: ESOPHAGOGASTRODUODENOSCOPY (EGD) WITH PROPOFOL ;  Surgeon: Annis Kinder, DO;  Location: [facility] ENDOSCOPY;  Service: Gastroenterology;  Laterality: N/A;   FLEXIBLE SIGMOIDOSCOPY N/A 11/16/2015   Procedure: FLEXIBLE SIGMOIDOSCOPY;  Surgeon: Danette Duos, MD;  Location: [facility] ENDOSCOPY;  Service: Gastroenterology;  Laterality: N/A;   LAPAROSCOPIC CHOLECYSTECTOMY  2008   LEFT HEART CATH N/A 06/14/2014   Procedure: LEFT HEART CATH;  Surgeon: Mickiel Albany, MD;  Location: [facility] CATH LAB;  Service: Cardiovascular;  Laterality: N/A;   LEFT HEART CATH AND CORONARY ANGIOGRAPHY N/A 08/02/2018   Procedure: LEFT HEART CATH AND CORONARY ANGIOGRAPHY;  Surgeon: Avanell Leigh, MD;  [facility] INVASIVE CV LAB;  Service: Cardiovascular;  Laterality: N/A;   LEFT HEART CATHETERIZATION WITH CORONARY/GRAFT ANGIOGRAM N/A 06/11/2014   Procedure: LEFT HEART CATHETERIZATION WITH Estella Helling;  Surgeon: Mickiel Albany, MD;  Location: [facility] CATH LAB;  Service: Cardiovascular;  Laterality: N/A;   PERCUTANEOUS CORONARY STENT INTERVENTION (PCI-S) N/A 06/13/2014   Procedure: PERCUTANEOUS CORONARY STENT INTERVENTION (PCI-S);  Surgeon: Mickiel Albany, MD;  Location: [facility] CATH LAB;  Service: Cardiovascular;  Laterality: N/A;   RIGHT HEART CATH N/A 01/06/2020   Procedure: RIGHT HEART CATH;  Surgeon: Mardell Shade, MD;  [facility]: [facility]  INVASIVE CV LAB;  Service: Cardiovascular;  Laterality: N/A;   RIGHT/LEFT HEART CATH AND CORONARY/GRAFT ANGIOGRAPHY N/A 02/02/2024   Procedure: RIGHT/LEFT HEART CATH AND CORONARY/GRAFT ANGIOGRAPHY;  Surgeon: Arnoldo Lapping, MD;  Location: [facility] INVASIVE CV LAB;  Service: Cardiovascular;  Laterality: N/A;   SKIN CANCER EXCISION Left 10/2015   calf   SKIN CANCER EXCISION Right 2014?   chest   TEE WITHOUT CARDIOVERSION N/A 01/06/2020   Procedure: TRANSESOPHAGEAL ECHOCARDIOGRAM (TEE);  Surgeon: Mardell Shade, MD;  Location: [facility] ENDOSCOPY;  Service: Cardiovascular;  Laterality: N/A;   TONSILLECTOMY     TRANSCATHETER MITRAL EDGE TO EDGE REPAIR N/A 02/06/2020   Procedure: MITRAL VALVE REPAIR;  Surgeon: Arnoldo Lapping, MD;  Location: [facility] INVASIVE CV LAB;  Service: Cardiovascular;  Laterality: N/A;   VASECTOMY      Family History  Problem Relation Age of Onset   Kidney disease Mother        Kidney failure   Stroke Mother    Diabetes Mother    Heart disease Father        MI   Arthritis Sister  Cancer Sister        Throat   Heart disease Sister        MI   Diabetes Sister    Prostate cancer Neg Hx    Colon cancer Neg Hx     Social History:  reports that he quit smoking about 67 years ago. His smoking use included cigarettes. He started smoking about 75 years ago. He has a 8 pack-year smoking history. He has never used smokeless tobacco. He reports that he does not drink alcohol and does not use drugs.  Allergies:  Allergies  Allergen Reactions   Brilinta  [Ticagrelor ] Other (See Comments)    bleeding   Nsaids Other (See Comments)    CKD    Medications: I have reviewed the patient's current medications.  Results for orders placed or performed during the hospital encounter of 04/12/24 (from the past 48 hours)  Comprehensive metabolic panel     Status: Abnormal   Collection Time: 04/12/24  1:51 PM  Result Value Ref Range   Sodium 135 135 - 145 mmol/L   Potassium 4.4 3.5 - 5.1  mmol/L   Chloride 98 98 - 111 mmol/L   CO2 23 22 - 32 mmol/L   Glucose, Bld 248 (H) 70 - 99 mg/dL    Comment: Glucose reference range applies only to samples taken after fasting for at least 8 hours.   BUN 39 (H) 8 - 23 mg/dL   Creatinine, Ser 1.61 (H) 0.61 - 1.24 mg/dL   Calcium  9.3 8.9 - 10.3 mg/dL   Total Protein 6.8 6.5 - 8.1 g/dL   Albumin 3.3 (L) 3.5 - 5.0 g/dL   AST 21 15 - 41 U/L   ALT 13 0 - 44 U/L   Alkaline Phosphatase 69 38 - 126 U/L   Total Bilirubin 0.9 0.0 - 1.2 mg/dL   GFR, Estimated 17 (L) >60 mL/min    Comment: (NOTE) Calculated using the CKD-EPI Creatinine Equation (2021)    Anion gap 14 5 - 15    Comment: Performed at [facility], [address] [place], [place] [address]  CBC     Status: Abnormal   Collection Time: 04/12/24  1:51 PM  Result Value Ref Range   WBC 6.1 4.0 - 10.5 K/uL   RBC 2.90 (L) 4.22 - 5.81 MIL/uL   Hemoglobin 9.3 (L) 13.0 - 17.0 g/dL   HCT 54.0 (L) 98.1 - 19.1 %   MCV 103.1 (H) 80.0 - 100.0 fL   MCH 32.1 26.0 - 34.0 pg   MCHC 31.1 30.0 - 36.0 g/dL   RDW 47.8 (H) 29.5 - 62.1 %   Platelets 141 (L) 150 - 400 K/uL   nRBC 0.0 0.0 - 0.2 %    Comment: Performed at [facility], [address] [place], [place] [address]  I-stat chem 8, ED     Status: Abnormal   Collection Time: 04/12/24  2:17 PM  Result Value Ref Range   Sodium 135 135 - 145 mmol/L   Potassium 4.4 3.5 - 5.1 mmol/L   Chloride 100 98 - 111 mmol/L   BUN 36 (H) 8 - 23 mg/dL   Creatinine, Ser 7.84 (H) 0.61 - 1.24 mg/dL   Glucose, Bld 696 (H) 70 - 99 mg/dL    Comment: Glucose reference range applies only to samples taken after fasting for at least 8 hours.   Calcium , Ion 1.13 (L) 1.15 - 1.40 mmol/L   TCO2 24 22 - 32 mmol/L  Hemoglobin 9.9 (L) 13.0 - 17.0 g/dL   HCT 16.1 (L) 09.6 - 04.5 %  Brain natriuretic peptide     Status: Abnormal   Collection Time: 04/12/24  6:14 PM  Result Value Ref Range   B Natriuretic Peptide 2,960.9 (H) 0.0 - 100.0 pg/mL     Comment: Performed at [facility], [address], [place], [place] [address]    CT CHEST ABDOMEN PELVIS W CONTRAST Result Date: 04/12/2024 CLINICAL DATA:  Polytrauma, blunt, fall, abdominal pain EXAM: CT CHEST, ABDOMEN, AND PELVIS WITH CONTRAST TECHNIQUE: Multidetector CT imaging of the chest, abdomen and pelvis was performed following the standard protocol during bolus administration of intravenous contrast. RADIATION DOSE REDUCTION: This exam was performed according to the departmental dose-optimization program which includes automated exposure control, adjustment of the mA and/or kV according to patient size and/or use of iterative reconstruction technique. CONTRAST:  60mL OMNIPAQUE  IOHEXOL  350 MG/ML SOLN COMPARISON:  04/12/2024, 08/18/2023 FINDINGS: CT CHEST FINDINGS Pulmonary Embolism: No pulmonary embolism. Cardiovascular: Cardiomegaly with a biatrial predominance. No pericardial effusion.No aortic aneurysm. Dense multi-vessel coronary atherosclerosis with changes of a prior CABG procedure. Diffusely calcified aorta. Mediastinum/Nodes: No mediastinal mass.No mediastinal, hilar, or axillary lymphadenopathy. Lungs/Pleura: The midline trachea and bronchi are patent. Moderate volume right pleural effusion with compressive atelectasis in the upper and lower lobes. Trace left pleural effusion. No pneumothorax. 5 mm nodule in the left upper lobe, likely infectious or inflammatory. CT ABDOMEN PELVIS FINDINGS Hepatobiliary: No mass.Cholecystectomy. Mild dilation of the common bile duct, likely related to the prior cholecystectomy. Pancreas: No mass or main ductal dilation.No peripancreatic inflammation or fluid collection. Spleen: Normal size. No mass. Adrenals/Urinary Tract: No adrenal masses. No renal mass. No hydronephrosis or nephrolithiasis. Circumferential wall thickening of the urinary bladder. Stomach/Bowel: The stomach contains ingested material without focal abnormality. No small bowel wall  thickening or inflammation. No small bowel obstruction.Normal appendix.Descending colonic diverticulosis. Sigmoid anastomosis. Vascular/Lymphatic: No aortic aneurysm. Diffuse aortoiliac atherosclerosis. no intraabdominal or pelvic lymphadenopathy. Reproductive: No prostatomegaly. Small volume free pelvic fluid. Mild presacral edema. Other: No pneumoperitoneum. Musculoskeletal: Osteopenia. Nondisplaced fracture through the right coracoid process. Nondisplaced fracture of the posterolateral right tenth rib. Mild buckle type fractures of the anterior seventh and eighth ribs. Remote, healing fractures of the lateral right ninth and left tenth ribs. Sternotomy wires. Multilevel degenerative disc disease of the spine. Mild grade 1 anterolisthesis of L4 on L5. Diffuse anasarca. IMPRESSION: 1. Nondisplaced fracture through the right coracoid process with nondisplaced fractures of the posterior right tenth rib with buckle type fractures of the anterior right seventh and eighth ribs. 2. Moderate volume right pleural effusion with compressive atelectasis. Trace left pleural effusion. 3. Diffuse anasarca. Small volume free fluid in the pelvis with presacral edema. In the setting of cardiomegaly, these findings are likely related to patient's volume status. 4. Circumferential wall thickening of the urinary bladder, which may be due to underdistension. If there is concern for acute cystitis, correlation with urinalysis would be recommended. 5. Descending colonic diverticulosis. No changes of acute diverticulitis. Aortic Atherosclerosis (ICD10-I70.0). Electronically Signed   By: Rance Burrows M.D.   On: 04/12/2024 17:47   DG Pelvis Portable Result Date: 04/12/2024 CLINICAL DATA:  Status post fall today with right-sided pain. EXAM: DG HIP (WITH OR WITHOUT PELVIS) 2-3V RIGHT; PORTABLE PELVIS 1-2 VIEWS COMPARISON:  None Available. FINDINGS: Pelvis: The cortical margins of the bony pelvis are intact. No fracture. Pubic symphysis  and sacroiliac joints are congruent. Both femoral heads are well-seated in the  respective acetabula. Right hip: No acute fracture. Femoral head is well seated, no dislocation. Mild acetabular spurring. Peripheral vascular calcifications are seen. Mild generalized soft tissue edema. IMPRESSION: 1. No fracture of the pelvis or right hip. 2. Mild degenerative change of the right hip. Electronically Signed   By: Chadwick Colonel M.D.   On: 04/12/2024 16:47   DG Hip Unilat W or Wo Pelvis 2-3 Views Right Result Date: 04/12/2024 CLINICAL DATA:  Status post fall today with right-sided pain. EXAM: DG HIP (WITH OR WITHOUT PELVIS) 2-3V RIGHT; PORTABLE PELVIS 1-2 VIEWS COMPARISON:  None Available. FINDINGS: Pelvis: The cortical margins of the bony pelvis are intact. No fracture. Pubic symphysis and sacroiliac joints are congruent. Both femoral heads are well-seated in the respective acetabula. Right hip: No acute fracture. Femoral head is well seated, no dislocation. Mild acetabular spurring. Peripheral vascular calcifications are seen. Mild generalized soft tissue edema. IMPRESSION: 1. No fracture of the pelvis or right hip. 2. Mild degenerative change of the right hip. Electronically Signed   By: Chadwick Colonel M.D.   On: 04/12/2024 16:47   DG Chest Portable 1 View Result Date: 04/12/2024 CLINICAL DATA:  Fall today on right side.  Right-sided pain. EXAM: PORTABLE CHEST 1 VIEW COMPARISON:  Radiograph 01/31/2024 FINDINGS: Lung volumes are low. Cardiomegaly is stable, post median sternotomy. Clip projecting over the lower mediastinum likely related to prior mitral valve repair. Small to moderate right pleural effusion has increased from prior exam. No convincing pneumothorax. Skin folds project over both lower lateral chest wall. No grossly displaced rib fracture. IMPRESSION: 1. Small to moderate right pleural effusion, increased from prior exam. 2. Low lung volumes. 3. Stable cardiomegaly. Electronically Signed   By:  Chadwick Colonel M.D.   On: 04/12/2024 16:46   CT HEAD WO CONTRAST ( ) Result Date: 04/12/2024 CLINICAL DATA:  Head trauma, moderate-severe; Neck trauma (Age >= 65y). Fall from standing. EXAM: CT HEAD WITHOUT CONTRAST CT CERVICAL SPINE WITHOUT CONTRAST TECHNIQUE: Multidetector CT imaging of the head and cervical spine was performed following the standard protocol without intravenous contrast. Multiplanar CT image reconstructions of the cervical spine were also generated. RADIATION DOSE REDUCTION: This exam was performed according to the departmental dose-optimization program which includes automated exposure control, adjustment of the mA and/or kV according to patient size and/or use of iterative reconstruction technique. COMPARISON:  CT head and cervical spine 11/18/2021 FINDINGS: CT HEAD FINDINGS Brain: There is no evidence of an acute infarct, intracranial hemorrhage, mass, midline shift, or extra-axial fluid collection. Generalized cerebral atrophy is mild for age. Cerebral white matter hypodensities are unchanged and nonspecific but compatible with mild chronic small vessel ischemic disease. Vascular: Calcified atherosclerosis at the skull base. No hyperdense vessel. Skull: No acute fracture or suspicious lesion. Sinuses/Orbits: Minimal mucosal thickening in the included portions of the paranasal sinuses. Clear mastoid air cells. Bilateral cataract extraction. Other: None. CT CERVICAL SPINE FINDINGS Alignment: Chronic reversal of the normal cervical lordosis. Unchanged trace anterolisthesis of C7 on T1 and trace retrolisthesis of C5 on C6. Skull base and vertebrae: No acute fracture or suspicious lesion. Soft tissues and spinal canal: No prevertebral fluid or swelling. No visible canal hematoma. Disc levels: Advanced disc degeneration from C3-4 through T1-2 and moderate multilevel facet arthrosis. Moderate to severe multilevel neural foraminal stenosis. No evidence of high-grade spinal canal stenosis.  Upper chest: More fully evaluated on the separately reported contemporaneous CT of the chest, abdomen, and pelvis. Other: Prominent atherosclerotic calcification at the carotid bifurcations. IMPRESSION: No evidence  of acute intracranial abnormality or cervical spine fracture. Electronically Signed   By: Aundra Lee M.D.   On: 04/12/2024 16:25   CT Cervical Spine Wo Contrast Result Date: 04/12/2024 CLINICAL DATA:  Head trauma, moderate-severe; Neck trauma (Age >= 65y). Fall from standing. EXAM: CT HEAD WITHOUT CONTRAST CT CERVICAL SPINE WITHOUT CONTRAST TECHNIQUE: Multidetector CT imaging of the head and cervical spine was performed following the standard protocol without intravenous contrast. Multiplanar CT image reconstructions of the cervical spine were also generated. RADIATION DOSE REDUCTION: This exam was performed according to the departmental dose-optimization program which includes automated exposure control, adjustment of the mA and/or kV according to patient size and/or use of iterative reconstruction technique. COMPARISON:  CT head and cervical spine 11/18/2021 FINDINGS: CT HEAD FINDINGS Brain: There is no evidence of an acute infarct, intracranial hemorrhage, mass, midline shift, or extra-axial fluid collection. Generalized cerebral atrophy is mild for age. Cerebral white matter hypodensities are unchanged and nonspecific but compatible with mild chronic small vessel ischemic disease. Vascular: Calcified atherosclerosis at the skull base. No hyperdense vessel. Skull: No acute fracture or suspicious lesion. Sinuses/Orbits: Minimal mucosal thickening in the included portions of the paranasal sinuses. Clear mastoid air cells. Bilateral cataract extraction. Other: None. CT CERVICAL SPINE FINDINGS Alignment: Chronic reversal of the normal cervical lordosis. Unchanged trace anterolisthesis of C7 on T1 and trace retrolisthesis of C5 on C6. Skull base and vertebrae: No acute fracture or suspicious lesion.  Soft tissues and spinal canal: No prevertebral fluid or swelling. No visible canal hematoma. Disc levels: Advanced disc degeneration from C3-4 through T1-2 and moderate multilevel facet arthrosis. Moderate to severe multilevel neural foraminal stenosis. No evidence of high-grade spinal canal stenosis. Upper chest: More fully evaluated on the separately reported contemporaneous CT of the chest, abdomen, and pelvis. Other: Prominent atherosclerotic calcification at the carotid bifurcations. IMPRESSION: No evidence of acute intracranial abnormality or cervical spine fracture. Electronically Signed   By: Aundra Lee M.D.   On: 04/12/2024 16:25    Review of Systems Blood pressure (!) 144/70, pulse 62, temperature 97.7 F (36.5 C), temperature source Oral, resp. rate 18, height 5\' 7"  (1.702 m), weight 72.1 kg, SpO2 95%. Physical Exam HENT:     Mouth/Throat:     Mouth: Mucous membranes are moist.  Cardiovascular:     Rate and Rhythm: Normal rate and regular rhythm.     Pulses: Normal pulses.  Pulmonary:     Effort: Pulmonary effort is normal.     Breath sounds: Normal breath sounds. No wheezing.     Comments: Tender R anterior/lateral ribs Abdominal:     General: Abdomen is flat. There is no distension.     Palpations: Abdomen is soft.     Tenderness: There is no abdominal tenderness.  Musculoskeletal:     Cervical back: Neck supple.     Comments: Contuisons RUE, sling  Skin:    General: Skin is warm.  Neurological:     Mental Status: He is alert and oriented to person, place, and time.     Assessment/Plan: 89yo GLF  R rib FX 7,8,10, chronic looking R pleural effusion - comfortable on RA, recommend multimodal pain control, pulmonary toilet. PT/OT. Likely can resume DOAC tomorrow.   Cloyce Darby 04/12/2024, 9:11 PM

## 2024-04-12 NOTE — ED Notes (Addendum)
 Trauma Response Nurse Documentation   [name] is a 89 y.o. male arriving to [facility] ED via EMS  On Eliquis  (apixaban ) daily. Trauma was activated as a Level 2 by ED Charge RN based on the following trauma criteria Elderly patients > 65 with head trauma on anti-coagulation (excluding ASA).  Patient cleared for CT by Dr. Val Garin. Pt transported to CT with trauma response nurse present to monitor. RN remained with the patient throughout their absence from the department for clinical observation.   GCS 15.  Trauma MD Arrival Time:   History   Past Medical History:  Diagnosis Date   Age-related macular degeneration, wet, both eyes ([name])    Anemia    Secondary to acute blood loss   Anginal pain ([name])    last chest pain in Feb 2021   Arthritis    "mild in hands, knees, ankles" (11/13/2015)   Atrial fibrillation ([name])    Consideration was given for atrial flutter ablation, but patient developed atrial fibrillation. Cardioversion was done. Dr. Rodolfo Clan decided to watch him clinically. November, 2011   Atrial flutter [facility]) 07/2010   September, 2011   Hospital with PNA and cath done...Coumadin .  Atrial flutter ablation planned, but  pt. then had atrial fibrillation,/outpatient conversion 09/08/10..NSR..plan to follow..Dr. Rodolfo Clan   CAD (coronary artery disease)    Catheterization, September, 2011,  grafts patent from redo CABG,, medical therapy of coronary disease, consideration to proceeding with atrial flutter ablation   Carotid artery disease ([name])    Doppler 09/18/2009 - 49% bilateral stenoses   Carotid artery disease ([name])    49% bilateral, Doppler, November, 2010   CHF (congestive heart failure) ([name])    Chronic kidney disease (CKD), stage III (moderate) ([name])    Diabetic peripheral neuropathy ([name])    feet   Diverticulosis of colon with hemorrhage 2009   several unit diverticular bleed    Erectile dysfunction    Mild   Gout    Hearing loss    wears hearing aids   History  of blood transfusion "several"   related to diverticular bleeding   Hyperlipidemia    Hypertension    Mitral regurgitation    Mild, echo, September, 2011   NSTEMI (non-ST elevated myocardial infarction) ([name]) 07/2010   at [facility] with repeat cath, rec medical mgmt    OSA on CPAP    uses CPAP nightly   Personal history of colonic polyps    PNA (pneumonia) 07/2010   NSTEMI at [facility] with repeat cath, rec medical mgmt    RBBB (right bundle branch block)    S/P mitral valve repair 02/06/2020   s/p TEER with a single MitraClip NTW placed on A2/P2   Shoulder pain    "positional; better now" (11/13/2015)   Skin cancer    R lower leg, per derm 2012   Type II diabetes mellitus ([name])      Past Surgical History:  Procedure Laterality Date   CARDIAC CATHETERIZATION  2006   Nuclear..slight lateral ischemia..medical therapy   CARDIAC CATHETERIZATION  08/04/2010   grafts patent from redo CABG...medical Rx and ablate Atrial flutter (LV not injected)    CARDIOVERSION  ~ 2010   CAROTID ENDARTERECTOMY Right 1994   CATARACT EXTRACTION W/ INTRAOCULAR LENS  IMPLANT, BILATERAL Bilateral    CHOLECYSTECTOMY     COLON RESECTION N/A 01/13/2016   Procedure: EXPLORATORY LAPAROTOMY, LEFT AND SIGMOID COLON REMOVAL;  Surgeon: Shela Derby, MD;  Location: [facility] OR;  Service: General;  Laterality: N/A;  Extended open left hemicolectomy and sigmoidectomy    COLONOSCOPY N/A 11/14/2015   Procedure: COLONOSCOPY;  Surgeon: Danette Duos, MD;  Location: [facility] ENDOSCOPY;  Service: Gastroenterology;  Laterality: N/A;   COLONOSCOPY Left 01/05/2016   Procedure: COLONOSCOPY;  Surgeon: Danette Duos, MD;  Location: [facility] ENDOSCOPY;  Service: Gastroenterology;  Laterality: Left;  no sedation to start, moderate if needed   COLONOSCOPY W/ POLYPECTOMY     CORONARY ARTERY BYPASS GRAFT  1995; 2006   "X 3; X3"   CORONARY ARTERY BYPASS GRAFT  1995, 2006   CORONARY STENT INTERVENTION N/A 02/02/2024   Procedure: CORONARY  STENT INTERVENTION;  Surgeon: Arnoldo Lapping, MD;  Location: [facility] INVASIVE CV LAB;  Service: Cardiovascular;  Laterality: N/A;   DOPPLER ECHOCARDIOGRAPHY  08/2008   EF 60%   DOPPLER ECHOCARDIOGRAPHY  08/02/2010   65-70%   DOPPLER ECHOCARDIOGRAPHY  07/2010   MR mild   ESOPHAGOGASTRODUODENOSCOPY N/A 06/19/2014   Procedure: ESOPHAGOGASTRODUODENOSCOPY (EGD);  Surgeon: Nannette Babe, MD;  Location: [facility] ENDOSCOPY;  Service: Endoscopy;  Laterality: N/A;   ESOPHAGOGASTRODUODENOSCOPY N/A 11/14/2015   Procedure: ESOPHAGOGASTRODUODENOSCOPY (EGD);  Surgeon: Danette Duos, MD;  Location: [facility] ENDOSCOPY;  Service: Gastroenterology;  Laterality: N/A;   ESOPHAGOGASTRODUODENOSCOPY (EGD) WITH PROPOFOL  N/A 08/18/2023   Procedure: ESOPHAGOGASTRODUODENOSCOPY (EGD) WITH PROPOFOL ;  Surgeon: Annis Kinder, DO;  Location: [facility] ENDOSCOPY;  Service: Gastroenterology;  Laterality: N/A;   FLEXIBLE SIGMOIDOSCOPY N/A 11/16/2015   Procedure: FLEXIBLE SIGMOIDOSCOPY;  Surgeon: Danette Duos, MD;  Location: [facility] ENDOSCOPY;  Service: Gastroenterology;  Laterality: N/A;   LAPAROSCOPIC CHOLECYSTECTOMY  2008   LEFT HEART CATH N/A 06/14/2014   Procedure: LEFT HEART CATH;  Surgeon: Mickiel Albany, MD;  Location: [facility] CATH LAB;  Service: Cardiovascular;  Laterality: N/A;   LEFT HEART CATH AND CORONARY ANGIOGRAPHY N/A 08/02/2018   Procedure: LEFT HEART CATH AND CORONARY ANGIOGRAPHY;  Surgeon: Avanell Leigh, MD;  Location: [facility] INVASIVE CV LAB;  Service: Cardiovascular;  Laterality: N/A;   LEFT HEART CATHETERIZATION WITH CORONARY/GRAFT ANGIOGRAM N/A 06/11/2014   Procedure: LEFT HEART CATHETERIZATION WITH Estella Helling;  Surgeon: Mickiel Albany, MD;  Location: [facility] CATH LAB;  Service: Cardiovascular;  Laterality: N/A;   PERCUTANEOUS CORONARY STENT INTERVENTION (PCI-S) N/A 06/13/2014   Procedure: PERCUTANEOUS CORONARY STENT INTERVENTION (PCI-S);  Surgeon: Mickiel Albany, MD;  Location: [facility] CATH LAB;   Service: Cardiovascular;  Laterality: N/A;   RIGHT HEART CATH N/A 01/06/2020   Procedure: RIGHT HEART CATH;  Surgeon: Mardell Shade, MD;  Location: [facility] INVASIVE CV LAB;  Service: Cardiovascular;  Laterality: N/A;   RIGHT/LEFT HEART CATH AND CORONARY/GRAFT ANGIOGRAPHY N/A 02/02/2024   Procedure: RIGHT/LEFT HEART CATH AND CORONARY/GRAFT ANGIOGRAPHY;  Surgeon: Arnoldo Lapping, MD;  Location: [facility] INVASIVE CV LAB;  Service: Cardiovascular;  Laterality: N/A;   SKIN CANCER EXCISION Left 10/2015   calf   SKIN CANCER EXCISION Right 2014?   chest   TEE WITHOUT CARDIOVERSION N/A 01/06/2020   Procedure: TRANSESOPHAGEAL ECHOCARDIOGRAM (TEE);  Surgeon: Mardell Shade, MD;  Location: [facility] ENDOSCOPY;  Service: Cardiovascular;  Laterality: N/A;   TONSILLECTOMY     TRANSCATHETER MITRAL EDGE TO EDGE REPAIR N/A 02/06/2020   Procedure: MITRAL VALVE REPAIR;  Surgeon: Arnoldo Lapping, MD;  Location: [facility] INVASIVE CV LAB;  Service: Cardiovascular;  Laterality: N/A;   VASECTOMY       Initial Focused Assessment (If applicable, or please see trauma documentation): Airway: Intact, patent  Breathing: Breath sounds diminished in R lower lung. SpO2 100% on  RA.  Circulation: No obvious signs of external hemorrhage.  Old wound to RLE.  Bilateral feet/leg swelling. Pulses weak but palpable.  Disability: A/Ox4 PERRLA. No HA or neck pain.   CT's Completed:   CT Head, CT C-Spine, CT Chest w/ contrast, and CT abdomen/pelvis w/ contrast   Interventions:  CXR Pelvic XR R hip XR Labs drawn CT pan scan  Plan for disposition:  Other Awaiting final read.  Consults completed:  Trauma surgery, Dr Hildy Lowers consulted/called at 1500 By EDP, Dr Val Garin, due to potential rib fxs and fluid within R lung on CT.  Awaiting final read.   Event Summary: Pt was bib EMS after GLF while standing in the kitchen today.  Pt was standing there attempting to cook breakfast when he lost his balance and fell, striking his R head, R  chest and R hip.  No LOC.  No HA or neck pain.  Pt c/o R hip and R chest/side pain. Pt is on eliquis .  Pt has had x2 bypass surgeries.  Pt also has a wound to his RLE from prior to this admission.    Bedside handoff with ED RN Burr Cary.    Juan Noel  Trauma Response RN  Please call TRN at [phone] for further assistance.

## 2024-04-12 NOTE — Telephone Encounter (Signed)
 Chief Complaint: leg swelling Symptoms: swelling, weeping Frequency: began weeping today, leg swelling is ongoing Pertinent Negatives: Patient denies fever, chills, calf pain, worsening SOB, CP at this time, heat Disposition: [] ED /[x] Urgent Care (no appt availability in office) / [] Appointment(In office/virtual)/ []  [facility]/ [] Home Care/ [] Refused Recommended Disposition /[]  Mobile Bus/ []  Follow-up with PCP Additional Notes: Pt reports worsening R leg swelling with weeping of clear fluid. Pt states his legs have been swollen before but the R leg started weeping fluid today. 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 Pt denies that the swelling is painful to the touch. He endorses calf swelling and states his whole leg is swollen from his toes to above his knee. Pt denies calf pain. Pt states he is able to walk despite some discomfort. Pt states he was instructed to take 2 torsemide  in the AM and 1 in the PM. Pt states he cut back to 1 torsemide  in the AM and 1 in the PM because he feels he is over-medicated. (Per chart review pt stopped taking this medication on 5/6.) Pt reports SOB with exertion but states this is his baseline. Pt reports taking 2 nitroglycerin  tablets this week because felt "weary." Pt denies he had CP and denies any CP at this time. RN advised pt he needs to be seen within 4 hrs. No availability in the office during that time frame so RN advised the pt go to an UC. Pt agreeable to do that, states he will go to the UC on [address] and family will take him. RN advised the pt if his SOB worsens or he has CP, or any worsening, he needs to go to the ED instead. Pt verbalized understanding. Pt would appreciate follow-up about how he should take his torsemide  and if he should take extra.      Copied from CRM [id number]. Topic: Clinical - Red Word Triage >> Apr 12, 2024 11:27 AM Alyse July wrote: Red Word that prompted transfer to Nurse  Triage: increased swelling in legs and seeping fluid from wounds Reason for Disposition  SEVERE leg swelling (e.g., swelling extends above knee, entire leg is swollen, weeping fluid)  Answer Assessment - Initial Assessment Questions 1. ONSET: "When did the swelling start?" (e.g., minutes, hours, days)     "It just started weeping fluid, it's a clear liquid" 2. LOCATION: "What part of the leg is swollen?"  "Are both legs swollen or just one leg?"     "Some in the L leg, much worse in the R leg" -- pt states the leg was operated on in the past 3. SEVERITY: "How bad is the swelling?" (e.g., localized; mild, moderate, severe)   - Localized: Small area of swelling localized to one leg.   - MILD pedal edema: Swelling limited to foot and ankle, pitting edema < 1/4 inch (6 mm) deep, rest and elevation eliminate most or all swelling.   - MODERATE edema: Swelling of lower leg to knee, pitting edema > 1/4 inch (6 mm) deep, rest and elevation only partially reduce swelling.   - SEVERE edema: Swelling extends above knee, facial or hand swelling present.      "Beginning to get past my knee"; moderate -- severe  4. REDNESS: "Does the swelling look red or infected?"     Daughter states leg looks pink; family denies heat 5. PAIN: "Is the swelling painful to touch?" If Yes, ask: "How painful is it?"   (  Scale 1-10; mild, moderate or severe)     Not painful to the touch; pt states walking is "uncomfortable" on his R foot, uses a walker 6. FEVER: "Do you have a fever?" If Yes, ask: "What is it, how was it measured, and when did it start?"      Denies  7. CAUSE: "What do you think is causing the leg swelling?"     Not sure -- pt has hx of HF, takes torsemide   8. MEDICAL HISTORY: "Do you have a history of blood clots (e.g., DVT), cancer, heart failure, kidney disease, or liver failure?"     no 9. RECURRENT SYMPTOM: "Have you had leg swelling before?" If Yes, ask: "When was the last time?" "What happened that  time?"     yes 10. OTHER SYMPTOMS: "Do you have any other symptoms?" (e.g., chest pain, difficulty breathing)       Daughter states his leg looks pink. Legs are weeping clear fluid.  Takes Eliquis , cut out Plavix . Denies DVT. Denies calf pain. Endorses calf swelling; "whole leg all the way to my toes is swollen", shoes are difficult to wear  Pt takes 1 torsemide  in the AM, one in the PM. States he urinates 2 hrs after taking torsemide . Pt states he is supposed to be taking 2 torsemide  in the AM but cut back "to reduce the pills he was taking"  Protocols used: Leg Swelling and Edema-A-AH

## 2024-04-12 NOTE — ED Notes (Signed)
 Cr 3.2 Dr Val Garin aware and OK to proceed with CAP w/ contrast due to symptoms from injury.

## 2024-04-12 NOTE — Progress Notes (Signed)
 Orthopedic Tech Progress Note Patient Details:  [name] 09/29/1932 [id number]  Ortho Devices Type of Ortho Device: Shoulder immobilizer Ortho Device/Splint Location: RUE Ortho Device/Splint Interventions: Ordered, Application   Post Interventions Patient Tolerated: Well Instructions Provided: Care of device   Celestine Bougie L Sheronda Parran 04/12/2024, 8:08 PM

## 2024-04-12 NOTE — Progress Notes (Signed)
 Orthopedic Tech Progress Note Patient Details:  [name] 1932-10-03 [id number]  Patient ID: [name], male   DOB: Nov 01, 1932, 89 y.o.   MRN: [id number] Respond to level 2 trauma Herbie Loll 04/12/2024, 1:55 PM

## 2024-04-12 NOTE — ED Triage Notes (Addendum)
 Pt had fall from standing in kitchen today. Hit right side of head and right hip. No deformitry noted. Gcs 15, A&OX4. Has pain to right rib. No LOC

## 2024-04-12 NOTE — H&P (Signed)
 History and Physical  [name]:[id number] DOB: 02-Jul-1932 DOA: 04/12/2024  PCP: Donnie Galea, MD   Chief Complaint: Fall   HPI: [name] is a 89 y.o. male with medical history significant for A-fib on Eliquis , CAD/NSTEMI s/p CABG, CKD 4, anemia, HLD, carotid artery disease, peripheral vascular disease, HTN, gout, GERD, OSA on CPAP, mitral regurg s/p MVR, HFrEF and T2DM who presents to the ED valuation after a fall.  Patient reports that while fixing oatmeal in his kitchen, he turned and his right leg buckled causing him to fall on his right side. He reports landing on his right shoulder which has chronic weakness and pain due to rotator cuff injury. He also reports hitting the right side of his head and his right hip. He denies any dizziness, shortness of breath or chest pain prior to the fall. Family reports that patient has had 3-4 falls over the last 5 months. Patient reports feeling fatigued ambulating from his bedroom to his kitchen but denies any shortness of breath or dyspnea on exertion.  He also reports lower extremity swelling that occasionally weeps. A week ago, he stopped taking his Plavix  after he started noticing blood clots in his mouth when he wakes up.  States he had similar problems while on Brilinta  which is why he was switched to Plavix . He is compliant with the rest of his medications.  ED Course: Initial vitals show temp 97.5, RR 17, HR 95 with dropped to the 50s and 60s, BP 101/65, SpO2 95% on room air.  Initial labs significant for blood glucose 248, BUN/creatinine 39/3.28, WBC 6.1, Hgb 9.3, platelet 141.  EKG shows A-fib with a rate of 65 and RBBB.  Trauma scan reveals nondisplaced fracture to the right coracoid process with nondisplaced fractures of the posterior right 10th rib and anterior right 7th and 8th ribs with moderate volume right pleural effusion, trace left pleural effusion.  Patient received IV Dilaudid  0.25 mg x 1.  A right shoulder  immobilizer/sling was placed.  Trauma surgery was consulted for evaluation. [facility] was consulted for admission.  Review of Systems: Please see HPI for pertinent positives and negatives. A complete 10 system review of systems are otherwise negative.  Past Medical History:  Diagnosis Date   Age-related macular degeneration, wet, both eyes ([name])    Anemia    Secondary to acute blood loss   Anginal pain ([name])    last chest pain in Feb 2021   Arthritis    "mild in hands, knees, ankles" (11/13/2015)   Atrial fibrillation ([name])    Consideration was given for atrial flutter ablation, but patient developed atrial fibrillation. Cardioversion was done. Dr. Rodolfo Clan decided to watch him clinically. November, 2011   Atrial flutter [facility]) 07/2010   September, 2011   Hospital with PNA and cath done...Coumadin .  Atrial flutter ablation planned, but  pt. then had atrial fibrillation,/outpatient conversion 09/08/10..NSR..plan to follow..Dr. Rodolfo Clan   CAD (coronary artery disease)    Catheterization, September, 2011,  grafts patent from redo CABG,, medical therapy of coronary disease, consideration to proceeding with atrial flutter ablation   Carotid artery disease ([name])    Doppler 09/18/2009 - 49% bilateral stenoses   Carotid artery disease ([name])    49% bilateral, Doppler, November, 2010   CHF (congestive heart failure) ([name])    Chronic kidney disease (CKD), stage III (moderate) ([name])    Diabetic peripheral neuropathy ([name])    feet   Diverticulosis of colon with hemorrhage 2009   several  unit diverticular bleed    Erectile dysfunction    Mild   Gout    Hearing loss    wears hearing aids   History of blood transfusion "several"   related to diverticular bleeding   Hyperlipidemia    Hypertension    Mitral regurgitation    Mild, echo, September, 2011   NSTEMI (non-ST elevated myocardial infarction) ([name]) 07/2010   at [facility] with repeat cath, rec medical mgmt    OSA on CPAP    uses CPAP nightly   Personal  history of colonic polyps    PNA (pneumonia) 07/2010   NSTEMI at [facility] with repeat cath, rec medical mgmt    RBBB (right bundle branch block)    S/P mitral valve repair 02/06/2020   s/p TEER with a single MitraClip NTW placed on A2/P2   Shoulder pain    "positional; better now" (11/13/2015)   Skin cancer    R lower leg, per derm 2012   Type II diabetes mellitus ([name])    Past Surgical History:  Procedure Laterality Date   CARDIAC CATHETERIZATION  2006   Nuclear..slight lateral ischemia..medical therapy   CARDIAC CATHETERIZATION  08/04/2010   grafts patent from redo CABG...medical Rx and ablate Atrial flutter (LV not injected)    CARDIOVERSION  ~ 2010   CAROTID ENDARTERECTOMY Right 1994   CATARACT EXTRACTION W/ INTRAOCULAR LENS  IMPLANT, BILATERAL Bilateral    CHOLECYSTECTOMY     COLON RESECTION N/A 01/13/2016   Procedure: EXPLORATORY LAPAROTOMY, LEFT AND SIGMOID COLON REMOVAL;  Surgeon: Shela Derby, MD;  Location: [facility] OR;  Service: General;  Laterality: N/A;  Extended open left hemicolectomy and sigmoidectomy    COLONOSCOPY N/A 11/14/2015   Procedure: COLONOSCOPY;  Surgeon: Danette Duos, MD;  Location: [facility] ENDOSCOPY;  Service: Gastroenterology;  Laterality: N/A;   COLONOSCOPY Left 01/05/2016   Procedure: COLONOSCOPY;  Surgeon: Danette Duos, MD;  Location: [facility] ENDOSCOPY;  Service: Gastroenterology;  Laterality: Left;  no sedation to start, moderate if needed   COLONOSCOPY W/ POLYPECTOMY     CORONARY ARTERY BYPASS GRAFT  1995; 2006   "X 3; X3"   CORONARY ARTERY BYPASS GRAFT  1995, 2006   CORONARY STENT INTERVENTION N/A 02/02/2024   Procedure: CORONARY STENT INTERVENTION;  Surgeon: Arnoldo Lapping, MD;  Location: [facility] INVASIVE CV LAB;  Service: Cardiovascular;  Laterality: N/A;   DOPPLER ECHOCARDIOGRAPHY  08/2008   EF 60%   DOPPLER ECHOCARDIOGRAPHY  08/02/2010   65-70%   DOPPLER ECHOCARDIOGRAPHY  07/2010   MR mild   ESOPHAGOGASTRODUODENOSCOPY N/A 06/19/2014    Procedure: ESOPHAGOGASTRODUODENOSCOPY (EGD);  Surgeon: Nannette Babe, MD;  Location: [facility] ENDOSCOPY;  Service: Endoscopy;  Laterality: N/A;   ESOPHAGOGASTRODUODENOSCOPY N/A 11/14/2015   Procedure: ESOPHAGOGASTRODUODENOSCOPY (EGD);  Surgeon: Danette Duos, MD;  Location: [facility] ENDOSCOPY;  Service: Gastroenterology;  Laterality: N/A;   ESOPHAGOGASTRODUODENOSCOPY (EGD) WITH PROPOFOL  N/A 08/18/2023   Procedure: ESOPHAGOGASTRODUODENOSCOPY (EGD) WITH PROPOFOL ;  Surgeon: Annis Kinder, DO;  Location: [facility] ENDOSCOPY;  Service: Gastroenterology;  Laterality: N/A;   FLEXIBLE SIGMOIDOSCOPY N/A 11/16/2015   Procedure: FLEXIBLE SIGMOIDOSCOPY;  Surgeon: Danette Duos, MD;  Location: [facility] ENDOSCOPY;  Service: Gastroenterology;  Laterality: N/A;   LAPAROSCOPIC CHOLECYSTECTOMY  2008   LEFT HEART CATH N/A 06/14/2014   Procedure: LEFT HEART CATH;  Surgeon: Mickiel Albany, MD;  Location: [facility] CATH LAB;  Service: Cardiovascular;  Laterality: N/A;   LEFT HEART CATH AND CORONARY ANGIOGRAPHY N/A 08/02/2018   Procedure: LEFT HEART CATH AND CORONARY ANGIOGRAPHY;  Surgeon: Avanell Leigh, MD;  Location: [facility] INVASIVE CV LAB;  Service: Cardiovascular;  Laterality: N/A;   LEFT HEART CATHETERIZATION WITH CORONARY/GRAFT ANGIOGRAM N/A 06/11/2014   Procedure: LEFT HEART CATHETERIZATION WITH Estella Helling;  Surgeon: Mickiel Albany, MD;  Location: [facility] CATH LAB;  Service: Cardiovascular;  Laterality: N/A;   PERCUTANEOUS CORONARY STENT INTERVENTION (PCI-S) N/A 06/13/2014   Procedure: PERCUTANEOUS CORONARY STENT INTERVENTION (PCI-S);  Surgeon: Mickiel Albany, MD;  Location: [facility] CATH LAB;  Service: Cardiovascular;  Laterality: N/A;   RIGHT HEART CATH N/A 01/06/2020   Procedure: RIGHT HEART CATH;  Surgeon: Mardell Shade, MD;  Location: [facility] INVASIVE CV LAB;  Service: Cardiovascular;  Laterality: N/A;   RIGHT/LEFT HEART CATH AND CORONARY/GRAFT ANGIOGRAPHY N/A 02/02/2024   Procedure: RIGHT/LEFT HEART CATH  AND CORONARY/GRAFT ANGIOGRAPHY;  Surgeon: Arnoldo Lapping, MD;  Location: [facility] INVASIVE CV LAB;  Service: Cardiovascular;  Laterality: N/A;   SKIN CANCER EXCISION Left 10/2015   calf   SKIN CANCER EXCISION Right 2014?   chest   TEE WITHOUT CARDIOVERSION N/A 01/06/2020   Procedure: TRANSESOPHAGEAL ECHOCARDIOGRAM (TEE);  Surgeon: Mardell Shade, MD;  Location: [facility] ENDOSCOPY;  Service: Cardiovascular;  Laterality: N/A;   TONSILLECTOMY     TRANSCATHETER MITRAL EDGE TO EDGE REPAIR N/A 02/06/2020   Procedure: MITRAL VALVE REPAIR;  Surgeon: Arnoldo Lapping, MD;  Location: [facility] INVASIVE CV LAB;  Service: Cardiovascular;  Laterality: N/A;   VASECTOMY     Social History:  reports that he quit smoking about 67 years ago. His smoking use included cigarettes. He started smoking about 75 years ago. He has a 8 pack-year smoking history. He has never used smokeless tobacco. He reports that he does not drink alcohol and does not use drugs.  Allergies  Allergen Reactions   Brilinta  [Ticagrelor ]     bleeding   Nsaids     CKD    Family History  Problem Relation Age of Onset   Kidney disease Mother        Kidney failure   Stroke Mother    Diabetes Mother    Heart disease Father        MI   Arthritis Sister    Cancer Sister        Throat   Heart disease Sister        MI   Diabetes Sister    Prostate cancer Neg Hx    Colon cancer Neg Hx      Prior to Admission medications   Medication Sig Start Date End Date Taking? Authorizing Provider  acetaminophen  (TYLENOL ) 325 MG tablet Take 2 tablets (650 mg total) by mouth every 6 (six) hours as needed for mild pain (pain score 1-3) (or Fever >/= 101). 02/03/24   Aura Leeds [place], DO  allopurinol  (ZYLOPRIM ) 100 MG tablet Take 0.5 tablets (50 mg total) by mouth every Monday, Wednesday, and Friday. 04/03/24   Donnie Galea, MD  Cholecalciferol  (VITAMIN D ) 1000 UNITS capsule Take 1,000 Units by mouth at bedtime.    [provider]  clopidogrel   (PLAVIX ) 75 MG tablet take 1 tablet by mouth daily. 02/27/24   Arnoldo Lapping, MD  ELIQUIS  2.5 MG TABS tablet TAKE 1 TABLET TWICE A DAY 12/04/23   Avanell Leigh, MD  fish oil-omega-3 fatty acids  1000 MG capsule Take 1 g by mouth at bedtime.    [provider]  glucose blood (FREESTYLE LITE) test strip USE TO TEST BLOOD SUGAR ONCE DAILY AND AS NEEDED 11/23/23  Donnie Galea, MD  hydrALAZINE  (APRESOLINE ) 25 MG tablet Take 1 tablet (25 mg total) by mouth daily. 12/02/22   Avanell Leigh, MD  HYDROcodone -acetaminophen  (NORCO/VICODIN) 5-325 MG tablet Take 0.5-1 tablets by mouth 2 (two) times daily as needed for moderate pain (pain score 4-6). Patient not taking: Reported on 03/19/2024 01/26/24   Donnie Galea, MD  hydrOXYzine  (VISTARIL ) 25 MG capsule Take 1 capsule (25 mg total) by mouth every 8 (eight) hours as needed. 02/12/24   Jolanda Nation, NP  ipratropium (ATROVENT ) 0.03 % nasal spray USE 2 SPRAYS IN EACH NOSTRIL TWICE A DAY AS NEEDED FOR RHINITIS 03/08/24   Donnie Galea, MD  isosorbide  mononitrate (IMDUR ) 60 MG 24 hr tablet Take 1.5 tablets (90 mg total) by mouth daily. 12/19/23   Avanell Leigh, MD  JARDIANCE  10 MG TABS tablet Take 10 mg by mouth daily. 03/11/24   [provider]  magnesium  oxide (MAG-OX) 400 MG tablet Take 1 tablet (400 mg total) by mouth every other day. 04/13/23   Donnie Galea, MD  metoprolol  succinate (TOPROL -XL) 100 MG 24 hr tablet Take 1 tablet (100 mg total) by mouth daily. Take with or immediately following a meal. 02/27/24   Arnoldo Lapping, MD  nitroGLYCERIN  (NITROSTAT ) 0.4 MG SL tablet DISSOLVE 1 TABLET UNDER THE TONGUE EVERY 5 MINUTES AS NEEDED FOR CHEST PAIN FOR A MAXIMUM OF 3 DOSES Patient not taking: Reported on 03/19/2024 07/01/22   Avanell Leigh, MD  polyethylene glycol (MIRALAX  / GLYCOLAX ) 17 g packet Take 17 g by mouth daily.    [provider]  Potassium Gluconate 595 MG CAPS Take 595 mg by mouth in the morning.     [provider]  ranolazine  (RANEXA ) 500 MG 12 hr tablet TAKE 1 TABLET TWICE A DAY 03/06/24   Arnoldo Lapping, MD  rosuvastatin  (CRESTOR ) 10 MG tablet TAKE 1 TABLET DAILY 02/07/24   Avanell Leigh, MD  torsemide  (DEMADEX ) 20 MG tablet Take 1 tab daily in the AM if weight is increasing. Patient not taking: Reported on 03/19/2024 02/16/24   Donnie Galea, MD    Physical Exam: BP (!) 143/64   Pulse (!) 53   Temp (!) 97.4 F (36.3 C) (Oral)   Resp 12   Ht 5\' 7"  (1.702 m)   Wt 72.1 kg   SpO2 98%   BMI 24.90 kg/m  General: Pleasant, tired-appearing elderly man laying in bed. No acute distress. HEENT: [place]/AT. Anicteric sclera.  CV: Regular rate. Irregularly irregular rhythm. No murmurs, rubs, or gallops. 2+ BLE pitting edema.  Pulmonary: Lungs CTAB. Normal effort. No wheezing. Distant rales at the bases. Abdominal: Soft, nontender, nondistended. Normal bowel sounds. Extremities: Feet are cool to touch. Pedal pulses nonpalpable due to swelling. Signal obtained with Doppler. MSK: Mild tenderness to palpation of the R shoulder and R anteriorolateral ribs Skin: Warm and dry. Small ulcer on the plantar aspect of the right big toe.  Mild bruising of the distal aspect of left third toe. Few fluid blisters covered with dressing.  Neuro: A&Ox3. Moves all extremities. Normal sensation to light touch. No focal deficit. Psych: Normal mood and affect          Labs on Admission:  Basic Metabolic Panel: Recent Labs  Lab 04/12/24 1351 04/12/24 1417  NA 135 135  K 4.4 4.4  CL 98 100  CO2 23  --   GLUCOSE 248* 245*  BUN 39* 36*  CREATININE 3.28* 3.20*  CALCIUM  9.3  --  Liver Function Tests: Recent Labs  Lab 04/12/24 1351  AST 21  ALT 13  ALKPHOS 69  BILITOT 0.9  PROT 6.8  ALBUMIN 3.3*   No results for input(s): "LIPASE", "AMYLASE" in the last 168 hours. No results for input(s): "AMMONIA" in the last 168 hours. CBC: Recent Labs  Lab 04/12/24 1351 04/12/24 1417  WBC 6.1   --   HGB 9.3* 9.9*  HCT 29.9* 29.0*  MCV 103.1*  --   PLT 141*  --    Cardiac Enzymes: No results for input(s): "CKTOTAL", "CKMB", "CKMBINDEX", "TROPONINI" in the last 168 hours. BNP (last 3 results) Recent Labs    01/31/24 0950  BNP 995.3*    ProBNP (last 3 results) No results for input(s): "PROBNP" in the last 8760 hours.  CBG: No results for input(s): "GLUCAP" in the last 168 hours.  Radiological Exams on Admission: CT CHEST ABDOMEN PELVIS W CONTRAST Result Date: 04/12/2024 CLINICAL DATA:  Polytrauma, blunt, fall, abdominal pain EXAM: CT CHEST, ABDOMEN, AND PELVIS WITH CONTRAST TECHNIQUE: Multidetector CT imaging of the chest, abdomen and pelvis was performed following the standard protocol during bolus administration of intravenous contrast. RADIATION DOSE REDUCTION: This exam was performed according to the departmental dose-optimization program which includes automated exposure control, adjustment of the mA and/or kV according to patient size and/or use of iterative reconstruction technique. CONTRAST:  60mL OMNIPAQUE  IOHEXOL  350 MG/ML SOLN COMPARISON:  04/12/2024, 08/18/2023 FINDINGS: CT CHEST FINDINGS Pulmonary Embolism: No pulmonary embolism. Cardiovascular: Cardiomegaly with a biatrial predominance. No pericardial effusion.No aortic aneurysm. Dense multi-vessel coronary atherosclerosis with changes of a prior CABG procedure. Diffusely calcified aorta. Mediastinum/Nodes: No mediastinal mass.No mediastinal, hilar, or axillary lymphadenopathy. Lungs/Pleura: The midline trachea and bronchi are patent. Moderate volume right pleural effusion with compressive atelectasis in the upper and lower lobes. Trace left pleural effusion. No pneumothorax. 5 mm nodule in the left upper lobe, likely infectious or inflammatory. CT ABDOMEN PELVIS FINDINGS Hepatobiliary: No mass.Cholecystectomy. Mild dilation of the common bile duct, likely related to the prior cholecystectomy. Pancreas: No mass or main  ductal dilation.No peripancreatic inflammation or fluid collection. Spleen: Normal size. No mass. Adrenals/Urinary Tract: No adrenal masses. No renal mass. No hydronephrosis or nephrolithiasis. Circumferential wall thickening of the urinary bladder. Stomach/Bowel: The stomach contains ingested material without focal abnormality. No small bowel wall thickening or inflammation. No small bowel obstruction.Normal appendix.Descending colonic diverticulosis. Sigmoid anastomosis. Vascular/Lymphatic: No aortic aneurysm. Diffuse aortoiliac atherosclerosis. no intraabdominal or pelvic lymphadenopathy. Reproductive: No prostatomegaly. Small volume free pelvic fluid. Mild presacral edema. Other: No pneumoperitoneum. Musculoskeletal: Osteopenia. Nondisplaced fracture through the right coracoid process. Nondisplaced fracture of the posterolateral right tenth rib. Mild buckle type fractures of the anterior seventh and eighth ribs. Remote, healing fractures of the lateral right ninth and left tenth ribs. Sternotomy wires. Multilevel degenerative disc disease of the spine. Mild grade 1 anterolisthesis of L4 on L5. Diffuse anasarca. IMPRESSION: 1. Nondisplaced fracture through the right coracoid process with nondisplaced fractures of the posterior right tenth rib with buckle type fractures of the anterior right seventh and eighth ribs. 2. Moderate volume right pleural effusion with compressive atelectasis. Trace left pleural effusion. 3. Diffuse anasarca. Small volume free fluid in the pelvis with presacral edema. In the setting of cardiomegaly, these findings are likely related to patient's volume status. 4. Circumferential wall thickening of the urinary bladder, which may be due to underdistension. If there is concern for acute cystitis, correlation with urinalysis would be recommended. 5. Descending colonic diverticulosis. No changes of acute diverticulitis.  Aortic Atherosclerosis (ICD10-I70.0). Electronically Signed   By: Rance Burrows M.D.   On: 04/12/2024 17:47   DG Pelvis Portable Result Date: 04/12/2024 CLINICAL DATA:  Status post fall today with right-sided pain. EXAM: DG HIP (WITH OR WITHOUT PELVIS) 2-3V RIGHT; PORTABLE PELVIS 1-2 VIEWS COMPARISON:  None Available. FINDINGS: Pelvis: The cortical margins of the bony pelvis are intact. No fracture. Pubic symphysis and sacroiliac joints are congruent. Both femoral heads are well-seated in the respective acetabula. Right hip: No acute fracture. Femoral head is well seated, no dislocation. Mild acetabular spurring. Peripheral vascular calcifications are seen. Mild generalized soft tissue edema. IMPRESSION: 1. No fracture of the pelvis or right hip. 2. Mild degenerative change of the right hip. Electronically Signed   By: Chadwick Colonel M.D.   On: 04/12/2024 16:47   DG Hip Unilat W or Wo Pelvis 2-3 Views Right Result Date: 04/12/2024 CLINICAL DATA:  Status post fall today with right-sided pain. EXAM: DG HIP (WITH OR WITHOUT PELVIS) 2-3V RIGHT; PORTABLE PELVIS 1-2 VIEWS COMPARISON:  None Available. FINDINGS: Pelvis: The cortical margins of the bony pelvis are intact. No fracture. Pubic symphysis and sacroiliac joints are congruent. Both femoral heads are well-seated in the respective acetabula. Right hip: No acute fracture. Femoral head is well seated, no dislocation. Mild acetabular spurring. Peripheral vascular calcifications are seen. Mild generalized soft tissue edema. IMPRESSION: 1. No fracture of the pelvis or right hip. 2. Mild degenerative change of the right hip. Electronically Signed   By: Chadwick Colonel M.D.   On: 04/12/2024 16:47   DG Chest Portable 1 View Result Date: 04/12/2024 CLINICAL DATA:  Fall today on right side.  Right-sided pain. EXAM: PORTABLE CHEST 1 VIEW COMPARISON:  Radiograph 01/31/2024 FINDINGS: Lung volumes are low. Cardiomegaly is stable, post median sternotomy. Clip projecting over the lower mediastinum likely related to prior mitral valve repair.  Small to moderate right pleural effusion has increased from prior exam. No convincing pneumothorax. Skin folds project over both lower lateral chest wall. No grossly displaced rib fracture. IMPRESSION: 1. Small to moderate right pleural effusion, increased from prior exam. 2. Low lung volumes. 3. Stable cardiomegaly. Electronically Signed   By: Chadwick Colonel M.D.   On: 04/12/2024 16:46   CT HEAD WO CONTRAST ( ) Result Date: 04/12/2024 CLINICAL DATA:  Head trauma, moderate-severe; Neck trauma (Age >= 65y). Fall from standing. EXAM: CT HEAD WITHOUT CONTRAST CT CERVICAL SPINE WITHOUT CONTRAST TECHNIQUE: Multidetector CT imaging of the head and cervical spine was performed following the standard protocol without intravenous contrast. Multiplanar CT image reconstructions of the cervical spine were also generated. RADIATION DOSE REDUCTION: This exam was performed according to the departmental dose-optimization program which includes automated exposure control, adjustment of the mA and/or kV according to patient size and/or use of iterative reconstruction technique. COMPARISON:  CT head and cervical spine 11/18/2021 FINDINGS: CT HEAD FINDINGS Brain: There is no evidence of an acute infarct, intracranial hemorrhage, mass, midline shift, or extra-axial fluid collection. Generalized cerebral atrophy is mild for age. Cerebral white matter hypodensities are unchanged and nonspecific but compatible with mild chronic small vessel ischemic disease. Vascular: Calcified atherosclerosis at the skull base. No hyperdense vessel. Skull: No acute fracture or suspicious lesion. Sinuses/Orbits: Minimal mucosal thickening in the included portions of the paranasal sinuses. Clear mastoid air cells. Bilateral cataract extraction. Other: None. CT CERVICAL SPINE FINDINGS Alignment: Chronic reversal of the normal cervical lordosis. Unchanged trace anterolisthesis of C7 on T1 and trace retrolisthesis of C5 on C6. Skull  base and vertebrae:  No acute fracture or suspicious lesion. Soft tissues and spinal canal: No prevertebral fluid or swelling. No visible canal hematoma. Disc levels: Advanced disc degeneration from C3-4 through T1-2 and moderate multilevel facet arthrosis. Moderate to severe multilevel neural foraminal stenosis. No evidence of high-grade spinal canal stenosis. Upper chest: More fully evaluated on the separately reported contemporaneous CT of the chest, abdomen, and pelvis. Other: Prominent atherosclerotic calcification at the carotid bifurcations. IMPRESSION: No evidence of acute intracranial abnormality or cervical spine fracture. Electronically Signed   By: Aundra Lee M.D.   On: 04/12/2024 16:25   CT Cervical Spine Wo Contrast Result Date: 04/12/2024 CLINICAL DATA:  Head trauma, moderate-severe; Neck trauma (Age >= 65y). Fall from standing. EXAM: CT HEAD WITHOUT CONTRAST CT CERVICAL SPINE WITHOUT CONTRAST TECHNIQUE: Multidetector CT imaging of the head and cervical spine was performed following the standard protocol without intravenous contrast. Multiplanar CT image reconstructions of the cervical spine were also generated. RADIATION DOSE REDUCTION: This exam was performed according to the departmental dose-optimization program which includes automated exposure control, adjustment of the mA and/or kV according to patient size and/or use of iterative reconstruction technique. COMPARISON:  CT head and cervical spine 11/18/2021 FINDINGS: CT HEAD FINDINGS Brain: There is no evidence of an acute infarct, intracranial hemorrhage, mass, midline shift, or extra-axial fluid collection. Generalized cerebral atrophy is mild for age. Cerebral white matter hypodensities are unchanged and nonspecific but compatible with mild chronic small vessel ischemic disease. Vascular: Calcified atherosclerosis at the skull base. No hyperdense vessel. Skull: No acute fracture or suspicious lesion. Sinuses/Orbits: Minimal mucosal thickening in the included  portions of the paranasal sinuses. Clear mastoid air cells. Bilateral cataract extraction. Other: None. CT CERVICAL SPINE FINDINGS Alignment: Chronic reversal of the normal cervical lordosis. Unchanged trace anterolisthesis of C7 on T1 and trace retrolisthesis of C5 on C6. Skull base and vertebrae: No acute fracture or suspicious lesion. Soft tissues and spinal canal: No prevertebral fluid or swelling. No visible canal hematoma. Disc levels: Advanced disc degeneration from C3-4 through T1-2 and moderate multilevel facet arthrosis. Moderate to severe multilevel neural foraminal stenosis. No evidence of high-grade spinal canal stenosis. Upper chest: More fully evaluated on the separately reported contemporaneous CT of the chest, abdomen, and pelvis. Other: Prominent atherosclerotic calcification at the carotid bifurcations. IMPRESSION: No evidence of acute intracranial abnormality or cervical spine fracture. Electronically Signed   By: Aundra Lee M.D.   On: 04/12/2024 16:25   Assessment/Plan [name] is a 89 y.o. male with medical history significant for  A-fib on Eliquis , CAD/NSTEMI s/p CABG, CKD 4, anemia, HLD, carotid artery disease, peripheral vascular disease, HTN, gout, GERD, OSA on CPAP, mitral regurg s/p MVR, HFrEF and T2DM who presents to the ED valuation after a fall and found to have rib fractures.  # Mechanical fall # Rib fractures # Right coracoid process fracture - Had a mechanical fall while turning in his kitchen - Trauma scan reveals nondisplaced fracture of the right coracoid process, 7th, 8th and 10th ribs. - CTH, CT cervical spine and hip x-ray without acute abnormalities - Trauma surgery consulted, appreciate recs - Multimodal pain control with scheduled Tylenol , lidocaine  patch and PRN oxycodone  and IV Dilaudid  - Continue shoulder sling - Follow-up vitamin D  levels - Incentive spirometer, flutter valve - PT/OT eval and treat - Fall precautions  # Acute on chronic  systolic heart failure # Pleural effusions - Patient with lower extremity swelling and fatigue with ambulation but no SOB -  Chest imaging showing moderate R pleural effusion. Trace L pleural effusion - BNP elevated to ~3000 - IV Lasix  40 mg daily - Continue Toprol  XL and Jardiance  - Continue home potassium supplementation - Supplemental O2 as needed - Trend and replete electrolytes  # Peripheral vascular disease - Patient with cool extremities and nonpalpable but dopplerable pedal pulses - Has developed small superficial ulcer on the right big toe - Endorsed weakness in lower extremity with ambulation but no pain - Check ABI - Continue atorvastatin   # AKI on CKD 4 - Creatinine of 3.28 increased from baseline of 2.6-2.9 - Likely in the setting of acute CHF exacerbation - IV diuresing as above - Avoid nephrotoxic agents - Trend renal function  # A-fib - EKG on admission shows persistent A-fib - HR stable in the 60s - Continue Toprol -XL and resume Eliquis  tomorrow - Telemetry  # T2DM - A1c 6.4% 2 months ago - Continue Jardiance  - SSI with meals, CBG monitoring  # CAD/NSTEMI s/p CABG - No chest pain - Continue rosuvastatin , Ranexa  and Imdur   # HTN - BP initially soft on admission but now with SBP in the 130s to 140s - Continue hydralazine , Toprol  XL, Ranexa  and Imdur   # Macrocytic anemia - Hemoglobin stable at 9.3 at baseline around 9-10 - Vitamin B12 of 464 2 months ago - Check iron studies, ferritin  # HLD - Continue rosuvastatin  and omega-3 fatty acids   # Gout - Continue allopurinol   # OSA - Continue CPAP at night  DVT prophylaxis: SCDs, Eliquis     Code Status: Limited: Do not attempt resuscitation (DNR) -DNR-LIMITED -Do Not Intubate/DNI   Consults called: Trauma surgery  Family Communication: Discussed admission with family in the room  Severity of Illness: The appropriate patient status for this patient is OBSERVATION. Observation status is judged  to be reasonable and necessary in order to provide the required intensity of service to ensure the patient's safety. The patient's presenting symptoms, physical exam findings, and initial radiographic and laboratory data in the context of their medical condition is felt to place them at decreased risk for further clinical deterioration. Furthermore, it is anticipated that the patient will be medically stable for discharge from the hospital within 2 midnights of admission.   Level of care: Telemetry Medical   This record has been created using Conservation officer, historic buildings. Errors have been sought and corrected, but may not always be located. Such creation errors do not reflect on the standard of care.   Vita Grip, MD 04/12/2024, 6:42 PM [facility] Pager: [phone] Isaiah 41:10   If 7PM-7AM, please contact night-coverage [email or web address] Password [email or web address]

## 2024-04-13 ENCOUNTER — Observation Stay (HOSPITAL_BASED_OUTPATIENT_CLINIC_OR_DEPARTMENT_OTHER)

## 2024-04-13 DIAGNOSIS — S42131A Displaced fracture of coracoid process, right shoulder, initial encounter for closed fracture: Secondary | ICD-10-CM | POA: Diagnosis present

## 2024-04-13 DIAGNOSIS — Z66 Do not resuscitate: Secondary | ICD-10-CM | POA: Diagnosis present

## 2024-04-13 DIAGNOSIS — D696 Thrombocytopenia, unspecified: Secondary | ICD-10-CM | POA: Diagnosis present

## 2024-04-13 DIAGNOSIS — E1142 Type 2 diabetes mellitus with diabetic polyneuropathy: Secondary | ICD-10-CM | POA: Diagnosis present

## 2024-04-13 DIAGNOSIS — I5023 Acute on chronic systolic (congestive) heart failure: Secondary | ICD-10-CM | POA: Diagnosis present

## 2024-04-13 DIAGNOSIS — I4819 Other persistent atrial fibrillation: Secondary | ICD-10-CM | POA: Diagnosis present

## 2024-04-13 DIAGNOSIS — Z7901 Long term (current) use of anticoagulants: Secondary | ICD-10-CM | POA: Diagnosis not present

## 2024-04-13 DIAGNOSIS — I7090 Unspecified atherosclerosis: Secondary | ICD-10-CM

## 2024-04-13 DIAGNOSIS — E1122 Type 2 diabetes mellitus with diabetic chronic kidney disease: Secondary | ICD-10-CM | POA: Diagnosis present

## 2024-04-13 DIAGNOSIS — S2241XA Multiple fractures of ribs, right side, initial encounter for closed fracture: Secondary | ICD-10-CM | POA: Diagnosis present

## 2024-04-13 DIAGNOSIS — J9811 Atelectasis: Secondary | ICD-10-CM | POA: Diagnosis present

## 2024-04-13 DIAGNOSIS — D631 Anemia in chronic kidney disease: Secondary | ICD-10-CM | POA: Diagnosis present

## 2024-04-13 DIAGNOSIS — E785 Hyperlipidemia, unspecified: Secondary | ICD-10-CM | POA: Diagnosis present

## 2024-04-13 DIAGNOSIS — I10 Essential (primary) hypertension: Secondary | ICD-10-CM | POA: Diagnosis not present

## 2024-04-13 DIAGNOSIS — N179 Acute kidney failure, unspecified: Secondary | ICD-10-CM

## 2024-04-13 DIAGNOSIS — I2510 Atherosclerotic heart disease of native coronary artery without angina pectoris: Secondary | ICD-10-CM | POA: Diagnosis present

## 2024-04-13 DIAGNOSIS — Y92000 Kitchen of unspecified non-institutional (private) residence as  the place of occurrence of the external cause: Secondary | ICD-10-CM | POA: Diagnosis not present

## 2024-04-13 DIAGNOSIS — W19XXXA Unspecified fall, initial encounter: Secondary | ICD-10-CM | POA: Diagnosis not present

## 2024-04-13 DIAGNOSIS — I739 Peripheral vascular disease, unspecified: Secondary | ICD-10-CM

## 2024-04-13 DIAGNOSIS — H353230 Exudative age-related macular degeneration, bilateral, stage unspecified: Secondary | ICD-10-CM | POA: Diagnosis present

## 2024-04-13 DIAGNOSIS — N184 Chronic kidney disease, stage 4 (severe): Secondary | ICD-10-CM | POA: Diagnosis present

## 2024-04-13 DIAGNOSIS — W010XXA Fall on same level from slipping, tripping and stumbling without subsequent striking against object, initial encounter: Secondary | ICD-10-CM | POA: Diagnosis present

## 2024-04-13 DIAGNOSIS — L89152 Pressure ulcer of sacral region, stage 2: Secondary | ICD-10-CM | POA: Diagnosis present

## 2024-04-13 DIAGNOSIS — I700 Atherosclerosis of aorta: Secondary | ICD-10-CM | POA: Diagnosis present

## 2024-04-13 DIAGNOSIS — E1151 Type 2 diabetes mellitus with diabetic peripheral angiopathy without gangrene: Secondary | ICD-10-CM | POA: Diagnosis present

## 2024-04-13 DIAGNOSIS — L97519 Non-pressure chronic ulcer of other part of right foot with unspecified severity: Secondary | ICD-10-CM | POA: Diagnosis present

## 2024-04-13 DIAGNOSIS — I130 Hypertensive heart and chronic kidney disease with heart failure and stage 1 through stage 4 chronic kidney disease, or unspecified chronic kidney disease: Secondary | ICD-10-CM | POA: Diagnosis present

## 2024-04-13 DIAGNOSIS — M109 Gout, unspecified: Secondary | ICD-10-CM | POA: Diagnosis present

## 2024-04-13 DIAGNOSIS — Z952 Presence of prosthetic heart valve: Secondary | ICD-10-CM | POA: Diagnosis not present

## 2024-04-13 DIAGNOSIS — M25511 Pain in right shoulder: Secondary | ICD-10-CM | POA: Diagnosis present

## 2024-04-13 DIAGNOSIS — E11621 Type 2 diabetes mellitus with foot ulcer: Secondary | ICD-10-CM | POA: Diagnosis present

## 2024-04-13 LAB — IRON AND TIBC
Iron: 45 ug/dL (ref 45–182)
Saturation Ratios: 11 % — ABNORMAL LOW (ref 17.9–39.5)
TIBC: 393 ug/dL (ref 250–450)
UIBC: 348 ug/dL

## 2024-04-13 LAB — RENAL FUNCTION PANEL
Albumin: 3.3 g/dL — ABNORMAL LOW (ref 3.5–5.0)
Anion gap: 12 (ref 5–15)
BUN: 36 mg/dL — ABNORMAL HIGH (ref 8–23)
CO2: 25 mmol/L (ref 22–32)
Calcium: 9.7 mg/dL (ref 8.9–10.3)
Chloride: 100 mmol/L (ref 98–111)
Creatinine, Ser: 3.21 mg/dL — ABNORMAL HIGH (ref 0.61–1.24)
GFR, Estimated: 18 mL/min — ABNORMAL LOW (ref >60)
Glucose, Bld: 149 mg/dL — ABNORMAL HIGH (ref 70–99)
Phosphorus: 3.8 mg/dL (ref 2.5–4.6)
Potassium: 4.6 mmol/L (ref 3.5–5.1)
Sodium: 137 mmol/L (ref 135–145)

## 2024-04-13 LAB — CBC
HCT: 31.1 % — ABNORMAL LOW (ref 39.0–52.0)
Hemoglobin: 10 g/dL — ABNORMAL LOW (ref 13.0–17.0)
MCH: 32.8 pg (ref 26.0–34.0)
MCHC: 32.2 g/dL (ref 30.0–36.0)
MCV: 102 fL — ABNORMAL HIGH (ref 80.0–100.0)
Platelets: 141 10*3/uL — ABNORMAL LOW (ref 150–400)
RBC: 3.05 MIL/uL — ABNORMAL LOW (ref 4.22–5.81)
RDW: 16.1 % — ABNORMAL HIGH (ref 11.5–15.5)
WBC: 5.7 10*3/uL (ref 4.0–10.5)
nRBC: 0 % (ref 0.0–0.2)

## 2024-04-13 LAB — VAS US ABI WITH/WO TBI
Left ABI: 0.81
Right ABI: 0.84

## 2024-04-13 LAB — VITAMIN D 25 HYDROXY (VIT D DEFICIENCY, FRACTURES): Vit D, 25-Hydroxy: 52.85 ng/mL (ref 30–100)

## 2024-04-13 LAB — MAGNESIUM: Magnesium: 2.6 mg/dL — ABNORMAL HIGH (ref 1.7–2.4)

## 2024-04-13 LAB — FERRITIN: Ferritin: 82 ng/mL (ref 24–336)

## 2024-04-13 MED ORDER — HYDROMORPHONE HCL 1 MG/ML IJ SOLN: 0.5000 mg | Freq: Four times a day (QID) | INTRAMUSCULAR | Status: DC | PRN

## 2024-04-13 MED ORDER — CLOPIDOGREL BISULFATE 75 MG PO TABS
75.0000 mg | ORAL_TABLET | Freq: Every day | ORAL | Status: DC
  Filled 2024-04-13: qty 1

## 2024-04-13 MED ORDER — OXYCODONE HCL 5 MG PO TABS
5.0000 mg | ORAL_TABLET | Freq: Four times a day (QID) | ORAL | Status: DC | PRN
  Administered 2024-04-13: 5 mg via ORAL
  Filled 2024-04-13 (×2): qty 1

## 2024-04-13 MED ORDER — ACETAMINOPHEN 500 MG PO TABS
1000.0000 mg | ORAL_TABLET | Freq: Three times a day (TID) | ORAL | Status: DC
  Administered 2024-04-13 – 2024-04-16 (×10): 1000 mg via ORAL
  Filled 2024-04-13 (×10): qty 2

## 2024-04-13 MED ORDER — LIDOCAINE 5 % EX PTCH
1.0000 | MEDICATED_PATCH | CUTANEOUS | Status: DC
  Administered 2024-04-13 – 2024-04-15 (×3): 1 via TRANSDERMAL
  Filled 2024-04-13 (×4): qty 1

## 2024-04-13 NOTE — Progress Notes (Signed)
 PROGRESS NOTE [name]  WGN:[id number] DOB: [birth year] DOA: 04/12/2024 PCP: Donnie Galea, MD  Brief Narrative/Hospital Course: 89 y.o. male with medical history significant for A-fib on Eliquis , CAD/NSTEMI s/p CABG, CKD 4, anemia, HLD,Carotid art dis, PVD  HTN, gout, GERD, OSA on CPAP, mitral regurg s/p MVR, HFrEF and T2DM who presents to the ED valuation after a fall while making oatmeal in the kitchen landing on the right shoulder, no loss of consciousness. In the ED hemodynamically stable,blood glucose 248, BUN/creatinine 39/3.28, WBC 6.1, Hgb 9.3, platelet 141.  EKG shows A-fib with a rate of 65 and RBBB.  Trauma scan reveals nondisplaced fracture to the right coracoid process with nondisplaced fractures of the posterior right 10th rib and anterior right 7th and 8th ribs with moderate volume right pleural effusion, A right shoulder immobilizer/sling was placed.  Trauma surgery was consulted and admitted.  Subjective: Seen and examined, Overnight vitals/labs/events reviewed  On RA, rt arm in sling When he takes deep breath, hurts in his rt ribs Overnight afebrile BP stable, not hypoxic. Labs shows BUN 36 creatinine 3.2 about the same CBC with chronic anemia thrombocytopenia.  Assessment and plan:  Mechanical fall at home Nondisplaced fracture of right coracoid process Right rib fractures 7, 8, 10 Chronic looking right pleural effusion: Trauma surgery input appreciated.patient needs comfortable on room air.  Continue with multimodal pain control pulmonary toileting PT OT- Wt bearing as tolerated likely ok cocacoid process is not displaced- will efer to trauma/ortho team. Per trauma okay to resume DOAC 5/31   Acute on chronic systolic heart failure Pleural effusions chronic appearing: Presenting with lower extremity swelling and fatigue with ambulation but no significant shortness of breath, BNP elevated and 3000 chest x-ray with moderate R pleural effusion. Continue IV Lasix   with monitoring of renal function.  Continue Toprol  Cont to monitor daily I/O,weight, electrolytes and net balance as below.Keep on  salt/fluid restricted diet and monitor in tele. Net IO Since Admission: -800 mL [04/13/24 0941]  Filed Weights   04/12/24 1351 04/12/24 1354  Weight: 72.1 kg 72.1 kg    Recent Labs  Lab 04/12/24 1351 04/12/24 1417 04/12/24 1814 04/13/24 0623  BNP  --   --  2,960.9*  --   BUN 39* 36*  --  36*  CREATININE 3.28* 3.20*  --  3.21*  K 4.4 4.4  --  4.6  MG  --   --   --  2.6*   PVD Patient with cool extremities and nonpalpable but dopplerable pedal pulses W/ small superficial ulcer on the right big toe Endorsed weakness in lower extremity with ambulation but no pain. Continue statin.  Follow-up ABI pending  AKI on CKD 4 baseline of 2.6-2.9.AKI likely in the setting of acute CHF exacerbation/cardiorenal syndrome.  Continue diuresis as above avoid nephrotoxic medication, monitor renal function  Persistent A-fib: HR stable, Continue Toprol -XL and resuming Eliqui  T2DM: Last  A1c 6.4% 2 months ago.  Continue sliding scale insulin , Jardiance  Recent Labs  Lab 04/12/24 2234  GLUCAP 173*    CAD/NSTEMI s/p CABG No chest pain cont home rosuvastatin , Ranexa  and Imdur    HTN: BP initially soft on admission.  Stable continue multiple meds w/ hydralazine , Toprol  XL, Ranexa  and Imdur    Macrocytic anemia Anemia of CKD Thrombocytopenia Stable hb w/ baseline ~ 9-10.Vitamin B12 of 464 2 months ago. F/u iron studies, ferritin.  Monitor CBC   HLD Continue rosuvastatin  and omega-3 fatty acids   Gout Continue allopurinol    OSA  Continue  CPAP at night   DVT prophylaxis: apixaban  (ELIQUIS ) tablet 2.5 mg Start: 04/13/24 1000 Place TED hose Start: 04/12/24 1944 Code Status:   Code Status: Limited: Do not attempt resuscitation (DNR) -DNR-LIMITED -Do Not Intubate/DNI  Family Communication: plan of care discussed with patient at bedside. Patient status is: Remains  hospitalized because of severity of illness Level of care: Telemetry Medical   Dispo: The patient is from: home alone, sons nearby            Anticipated disposition: TBD Objective: Vitals last 24 hrs: Vitals:   04/12/24 2000 04/12/24 2021 04/13/24 0505 04/13/24 0911  BP: 136/62 (!) 144/70 (!) 143/66 122/66  Pulse:  62 (!) 55 (!) 59  Resp: 14 18 18 16   Temp:  97.7 F (36.5 C) 97.8 F (36.6 C) (!) 97.3 F (36.3 C)  TempSrc:  Oral Oral Oral  SpO2:  95% 100% 93%  Weight:      Height:        Physical Examination: General exam: alert awake, older than stated age HEENT:Oral mucosa moist, Ear/Nose WNL grossly Respiratory system: Bilaterally clear BS, no use of accessory muscle Cardiovascular system: S1 & S2 +. Gastrointestinal system: Abdomen soft,  NT,ND,BS+ Nervous System: Alert, awake, following commands. Extremities:  leg chronic appearing swelling, f/l feet toes are warm Skin: No rashes,warm. MSK: Normal muscle bulk/tone.   Data Reviewed: I have personally reviewed following labs and imaging studies ( see epic result tab) CBC: Recent Labs  Lab 04/12/24 1351 04/12/24 1417 04/13/24 0623  WBC 6.1  --  5.7  HGB 9.3* 9.9* 10.0*  HCT 29.9* 29.0* 31.1*  MCV 103.1*  --  102.0*  PLT 141*  --  141*   CMP: Recent Labs  Lab 04/12/24 1351 04/12/24 1417 04/13/24 0623  NA 135 135 137  K 4.4 4.4 4.6  CL 98 100 100  CO2 23  --  25  GLUCOSE 248* 245* 149*  BUN 39* 36* 36*  CREATININE 3.28* 3.20* 3.21*  CALCIUM  9.3  --  9.7  MG  --   --  2.6*  PHOS  --   --  3.8   GFR: Estimated Creatinine Clearance: 14.3 mL/min (A) (by C-G formula based on SCr of 3.21 mg/dL (H)). Recent Labs  Lab 04/12/24 1351 04/13/24 0623  AST 21  --   ALT 13  --   ALKPHOS 69  --   BILITOT 0.9  --   PROT 6.8  --   ALBUMIN 3.3* 3.3*   No results for input(s): "LIPASE", "AMYLASE" in the last 168 hours. No results for input(s): "AMMONIA" in the last 168 hours. Coagulation Profile: No results for  input(s): "INR", "PROTIME" in the last 168 hours. Unresulted Labs (From admission, onward)     Start     Ordered   04/14/24 0500  Basic metabolic panel with GFR  Daily,   R      04/13/24 0745   04/14/24 0500  CBC  Daily,   R      04/13/24 0745   04/12/24 1814  Urinalysis, w/ Reflex to Culture (Infection Suspected) -Urine, Clean Catch  Once,   URGENT       Question:  Specimen Source  Answer:  Urine, Clean Catch   04/12/24 1814           Antimicrobials/Microbiology: Anti-infectives (From admission, onward)    None         Component Value Date/Time   SDES BLOOD LEFT HAND 06/14/2014 2225  SPECREQUEST BOTTLES DRAWN AEROBIC ONLY 5CC 06/14/2014 2225   CULT  06/14/2014 2225    NO GROWTH 5 DAYS Performed at [facility]   REPTSTATUS 06/23/2014 FINAL 06/14/2014 2225   Medications reviewed:  Scheduled Meds:  acetaminophen   1,000 mg Oral TID   [START ON 04/15/2024] allopurinol   50 mg Oral Q M,W,F   apixaban   2.5 mg Oral BID   cholecalciferol   1,000 Units Oral QHS   empagliflozin   10 mg Oral Daily   furosemide   40 mg Intravenous Daily   hydrALAZINE   25 mg Oral Daily   isosorbide  mononitrate  90 mg Oral Daily   lidocaine   1 patch Transdermal Q24H   magnesium  oxide  400 mg Oral QODAY   metoprolol  succinate  100 mg Oral Daily   omega-3 acid ethyl esters  1 g Oral QHS   polyethylene glycol  17 g Oral Daily   potassium chloride   20 mEq Oral Daily   ranolazine   500 mg Oral BID   rosuvastatin   10 mg Oral Daily   Continuous Infusions:  Lesa Rape, MD [facility] 04/13/2024, 9:44 AM

## 2024-04-13 NOTE — Care Management Obs Status (Signed)
 MEDICARE OBSERVATION STATUS NOTIFICATION   Patient Details  Name: [name] MRN: [id number] Date of Birth: 02/05/1932   Medicare Observation Status Notification Given:  Yes    Jannine Meo, RN 04/13/2024, 2:43 PM

## 2024-04-13 NOTE — Progress Notes (Signed)
 I was contacted with regards to patient's right coracoid process fracture.I have reviewed the patient's images, notable for a nondisplaced right coracoid fracture.  I do recommend that the patient be maintained in a sling in the right side, and he can follow up with our sports medicine specialist electively, Dr. Damien Dukes.

## 2024-04-13 NOTE — Progress Notes (Signed)
 ABI's have been completed. Preliminary results can be found in CV Proc through chart review.   04/13/24 2:02 PM Birda Buffy RVT

## 2024-04-13 NOTE — Evaluation (Signed)
 Physical Therapy Evaluation Patient Details Name: [name] MRN: [id number] DOB: 11-12-1932 Today's Date: 04/13/2024  History of Present Illness  Pt is 89 y.o. male presented to [facility] on 04/12/24 due to fall at home, on anticoagulation. W/u revealed Rt 7, 8, 10 rib fx's, Rt nondisplaced coracoid process fx, and Rt pleural effusion. on  01/31/24 for hypoglycemia due to taking too much insulin . PMH significant for A-fib on Eliquis , CAD/NSTEMI s/p CABG, CKD 4, anemia, HLD, CAD, PVD, HTN, gout, GERD, OSA on CPAP, mitral regurg s/p MVR, HFrEF and T2DM .   Clinical Impression  [name] is 89 y.o. male admitted with above HPI and diagnosis. Patient is currently limited by functional impairments below (see PT problem list). Patient lives alone and is mod ind with RW for household mobility at baseline. Pt currently requires min assist for bed mobility and unable to stand with 1 assist, +2 mod assist for sit<>stand and pivot bed>chair. Pt with strong posterior lean during transfers and assist to shift hips anterior and block feet to prevent sliding. Once in recliner pt requesting BSC for BM, Mod+2 for transfer and pt requires extended time to relax for voiding B&B. Pt unsuccessful with BM but bladder voided. EOS pt returned to recliner, Alarm on and call bell within reach. Patient will benefit from continued skilled PT interventions to address impairments and progress independence with mobility. Patient will benefit from continued inpatient follow up therapy, <3 hours/day. Acute PT will follow and progress as able.         If plan is discharge home, recommend the following: Two people to help with walking and/or transfers;A lot of help with bathing/dressing/bathroom;Assistance with cooking/housework;Direct supervision/assist for medications management;Help with stairs or ramp for entrance;Assist for transportation   Can travel by private vehicle   No    Equipment Recommendations  (defer to next  venue)  Recommendations for Other Services       Functional Status Assessment Patient has had a recent decline in their functional status and demonstrates the ability to make significant improvements in function in a reasonable and predictable amount of time.     Precautions / Restrictions Precautions Precautions: Fall (Family reports that patient has had 3-4 falls over the last 5 months, pt reports 3 in last year) Recall of Precautions/Restrictions: Intact Precaution/Restrictions Comments: Rt coracoid process fx Required Braces or Orthoses: Sling Restrictions Weight Bearing Restrictions Per Provider Order: Yes RUE Weight Bearing Per Provider Order: Non weight bearing      Mobility  Bed Mobility Overal bed mobility: Needs Assistance Bed Mobility: Supine to Sit     Supine to sit: Min assist, Used rails, HOB elevated     General bed mobility comments: cues to sequence, min assist to fully pivot and raise trunk. cues to maintain NWB on Rt UE    Transfers Overall transfer level: Needs assistance Equipment used: 2 person hand held assist, 1 person hand held assist Transfers: Sit to/from Stand, Bed to chair/wheelchair/BSC Sit to Stand: Mod assist, +2 safety/equipment, +2 physical assistance   Step pivot transfers: Mod assist, +2 physical assistance, +2 safety/equipment       General transfer comment: pt unable to rise with 1HHA and required MOd +2 with use of bed pad to facilitate lift at hips. 1x sit<>stand from EOB, blocking bil LE's to prevent anterior slide of feet due to posterior lean. Mod-max for steps bed>chair. Transfers completed chair<>BSC at EOS for request of urge for BM.    Ambulation/Gait  Stairs            Wheelchair Mobility     Tilt Bed    Modified Rankin (Stroke Patients Only)       Balance Overall balance assessment: Needs assistance Sitting-balance support: Feet supported Sitting balance-Leahy Scale: Good      Standing balance support: During functional activity, Single extremity supported Standing balance-Leahy Scale: Poor Standing balance comment: reliant on Lt UE support and external support for balance from therapist.                             Pertinent Vitals/Pain Pain Assessment Pain Assessment: Faces Faces Pain Scale: Hurts little more Pain Location: Rt ribs Pain Descriptors / Indicators: Aching, Discomfort Pain Intervention(s): Limited activity within patient's tolerance, Monitored during session, Repositioned    Home Living Family/patient expects to be discharged to:: Private residence Living Arrangements: Alone Available Help at Discharge: Family;Available PRN/intermittently Type of Home: House Home Access: Stairs to enter Entrance Stairs-Rails: Right Entrance Stairs-Number of Steps: 3   Home Layout: One level;Other (Comment) (Ramps to get into sunken areas) Home Equipment: Rolling Walker (2 wheels);Crutches;Toilet riser;Lift chair;Grab bars - tub/shower;Cane - single point      Prior Function Prior Level of Function : Independent/Modified Independent             Mobility Comments: Uses RW, wasn't using RW when fell       Extremity/Trunk Assessment   Upper Extremity Assessment Upper Extremity Assessment: Defer to OT evaluation    Lower Extremity Assessment Lower Extremity Assessment: Generalized weakness    Cervical / Trunk Assessment Cervical / Trunk Assessment: Kyphotic  Communication   Communication Communication: Impaired Factors Affecting Communication: Hearing impaired    Cognition Arousal: Alert Behavior During Therapy: WFL for tasks assessed/performed   PT - Cognitive impairments: No apparent impairments                         Following commands: Intact       Cueing Cueing Techniques: Verbal cues     General Comments      Exercises     Assessment/Plan    PT Assessment Patient needs continued PT services   PT Problem List Decreased strength;Decreased range of motion;Decreased activity tolerance;Decreased balance;Decreased mobility;Decreased coordination;Decreased cognition;Decreased knowledge of use of DME;Decreased safety awareness;Decreased knowledge of precautions       PT Treatment Interventions DME instruction;Gait training;Stair training;Functional mobility training;Therapeutic activities;Therapeutic exercise;Balance training;Neuromuscular re-education;Cognitive remediation;Patient/family education    PT Goals (Current goals can be found in the Care Plan section)  Acute Rehab PT Goals Patient Stated Goal: recover, stop falling PT Goal Formulation: With patient Time For Goal Achievement: 04/27/24 Potential to Achieve Goals: Good    Frequency Min 2X/week     Co-evaluation               AM-PAC PT "6 Clicks" Mobility  Outcome Measure Help needed turning from your back to your side while in a flat bed without using bedrails?: A Little Help needed moving from lying on your back to sitting on the side of a flat bed without using bedrails?: A Little Help needed moving to and from a bed to a chair (including a wheelchair)?: Total Help needed standing up from a chair using your arms (e.g., wheelchair or bedside chair)?: Total Help needed to walk in hospital room?: Total Help needed climbing 3-5 steps with a railing? : Total 6 Click Score:  10    End of Session Equipment Utilized During Treatment: Gait belt;Other (comment) (Rt UE sling) Activity Tolerance: Patient tolerated treatment well Patient left: in chair;with call bell/phone within reach;with chair alarm set;with family/visitor present Nurse Communication: Mobility status PT Visit Diagnosis: Other abnormalities of gait and mobility (R26.89);Muscle weakness (generalized) (M62.81);Difficulty in walking, not elsewhere classified (R26.2);Unsteadiness on feet (R26.81);History of falling (Z91.81)    Time: 1032-1119 (10 mins not  billable) PT Time Calculation (min) (ACUTE ONLY): 47 min   Charges:   PT Evaluation $PT Eval Moderate Complexity: 1 Mod PT Treatments $Therapeutic Activity: 8-22 mins PT General Charges $$ ACUTE PT VISIT: 1 Visit         Tish Forge, DPT Acute Rehabilitation Services Office [phone]  04/13/24 11:21 AM

## 2024-04-13 NOTE — Progress Notes (Signed)
   04/13/24 2313  BiPAP/CPAP/SIPAP  Reason BIPAP/CPAP not in use Other(comment) (pt states he hasnt worn one in months and that he doesnt need it tonight.)

## 2024-04-13 NOTE — Progress Notes (Signed)
 Subjective: CC: R shoulder and rib pain.  No other new complaints after the fall.   Afebrile. No tachycardia or hypotension. On RA. Hgb stable.   Objective: Vital signs in last 24 hours: Temp:  [97.3 F (36.3 C)-97.8 F (36.6 C)] 97.3 F (36.3 C) (05/31 0911) Pulse Rate:  [51-95] 59 (05/31 0911) Resp:  [11-18] 16 (05/31 0911) BP: (101-144)/(58-76) 122/66 (05/31 0911) SpO2:  [93 %-100 %] 93 % (05/31 0911) Weight:  [72.1 kg] 72.1 kg (05/30 1354) Last BM Date : 04/11/24  Intake/Output from previous day: 05/30 0701 - 05/31 0700 In: -  Out: 800 [Urine:800] Intake/Output this shift: No intake/output data recorded.  PE: Gen:  Alert, NAD, pleasant HEENT: EOM's intact, pupils equal and round Card:  Reg  Pulm:  CTAB, no W/R/R, effort normal. On RA Abd: Soft, ND, NT Ext: RUE in sling. No ttp of the R elbow, wrist or hand. Spont movement of LUE and BLE.  Neuro: F/c, non-focal  Lab Results:  Recent Labs    04/12/24 1351 04/12/24 1417 04/13/24 0623  WBC 6.1  --  5.7  HGB 9.3* 9.9* 10.0*  HCT 29.9* 29.0* 31.1*  PLT 141*  --  141*   BMET Recent Labs    04/12/24 1351 04/12/24 1417 04/13/24 0623  NA 135 135 137  K 4.4 4.4 4.6  CL 98 100 100  CO2 23  --  25  GLUCOSE 248* 245* 149*  BUN 39* 36* 36*  CREATININE 3.28* 3.20* 3.21*  CALCIUM  9.3  --  9.7   PT/INR No results for input(s): "LABPROT", "INR" in the last 72 hours. CMP     Component Value Date/Time   NA 137 04/13/2024 0623   NA 140 03/23/2023 1053   K 4.6 04/13/2024 0623   CL 100 04/13/2024 0623   CO2 25 04/13/2024 0623   GLUCOSE 149 (H) 04/13/2024 0623   BUN 36 (H) 04/13/2024 0623   BUN 42 (H) 03/23/2023 1053   CREATININE 3.21 (H) 04/13/2024 0623   CREATININE 2.80 (H) 08/25/2023 1453   CALCIUM  9.7 04/13/2024 0623   PROT 6.8 04/12/2024 1351   ALBUMIN 3.3 (L) 04/13/2024 0623   ALBUMIN 4.0 03/08/2023 1534   AST 21 04/12/2024 1351   ALT 13 04/12/2024 1351   ALKPHOS 69 04/12/2024 1351    BILITOT 0.9 04/12/2024 1351   GFRNONAA 18 (L) 04/13/2024 0623   GFRAA 24 (L) 07/30/2020 1237   Lipase     Component Value Date/Time   LIPASE 43 12/06/2022 1315    Studies/Results: CT CHEST ABDOMEN PELVIS W CONTRAST Result Date: 04/12/2024 CLINICAL DATA:  Polytrauma, blunt, fall, abdominal pain EXAM: CT CHEST, ABDOMEN, AND PELVIS WITH CONTRAST TECHNIQUE: Multidetector CT imaging of the chest, abdomen and pelvis was performed following the standard protocol during bolus administration of intravenous contrast. RADIATION DOSE REDUCTION: This exam was performed according to the departmental dose-optimization program which includes automated exposure control, adjustment of the mA and/or kV according to patient size and/or use of iterative reconstruction technique. CONTRAST:  60mL OMNIPAQUE  IOHEXOL  350 MG/ML SOLN COMPARISON:  04/12/2024, 08/18/2023 FINDINGS: CT CHEST FINDINGS Pulmonary Embolism: No pulmonary embolism. Cardiovascular: Cardiomegaly with a biatrial predominance. No pericardial effusion.No aortic aneurysm. Dense multi-vessel coronary atherosclerosis with changes of a prior CABG procedure. Diffusely calcified aorta. Mediastinum/Nodes: No mediastinal mass.No mediastinal, hilar, or axillary lymphadenopathy. Lungs/Pleura: The midline trachea and bronchi are patent. Moderate volume right pleural effusion with compressive atelectasis in the upper and lower lobes. Trace left  pleural effusion. No pneumothorax. 5 mm nodule in the left upper lobe, likely infectious or inflammatory. CT ABDOMEN PELVIS FINDINGS Hepatobiliary: No mass.Cholecystectomy. Mild dilation of the common bile duct, likely related to the prior cholecystectomy. Pancreas: No mass or main ductal dilation.No peripancreatic inflammation or fluid collection. Spleen: Normal size. No mass. Adrenals/Urinary Tract: No adrenal masses. No renal mass. No hydronephrosis or nephrolithiasis. Circumferential wall thickening of the urinary bladder.  Stomach/Bowel: The stomach contains ingested material without focal abnormality. No small bowel wall thickening or inflammation. No small bowel obstruction.Normal appendix.Descending colonic diverticulosis. Sigmoid anastomosis. Vascular/Lymphatic: No aortic aneurysm. Diffuse aortoiliac atherosclerosis. no intraabdominal or pelvic lymphadenopathy. Reproductive: No prostatomegaly. Small volume free pelvic fluid. Mild presacral edema. Other: No pneumoperitoneum. Musculoskeletal: Osteopenia. Nondisplaced fracture through the right coracoid process. Nondisplaced fracture of the posterolateral right tenth rib. Mild buckle type fractures of the anterior seventh and eighth ribs. Remote, healing fractures of the lateral right ninth and left tenth ribs. Sternotomy wires. Multilevel degenerative disc disease of the spine. Mild grade 1 anterolisthesis of L4 on L5. Diffuse anasarca. IMPRESSION: 1. Nondisplaced fracture through the right coracoid process with nondisplaced fractures of the posterior right tenth rib with buckle type fractures of the anterior right seventh and eighth ribs. 2. Moderate volume right pleural effusion with compressive atelectasis. Trace left pleural effusion. 3. Diffuse anasarca. Small volume free fluid in the pelvis with presacral edema. In the setting of cardiomegaly, these findings are likely related to patient's volume status. 4. Circumferential wall thickening of the urinary bladder, which may be due to underdistension. If there is concern for acute cystitis, correlation with urinalysis would be recommended. 5. Descending colonic diverticulosis. No changes of acute diverticulitis. Aortic Atherosclerosis (ICD10-I70.0). Electronically Signed   By: Rance Burrows M.D.   On: 04/12/2024 17:47   DG Pelvis Portable Result Date: 04/12/2024 CLINICAL DATA:  Status post fall today with right-sided pain. EXAM: DG HIP (WITH OR WITHOUT PELVIS) 2-3V RIGHT; PORTABLE PELVIS 1-2 VIEWS COMPARISON:  None  Available. FINDINGS: Pelvis: The cortical margins of the bony pelvis are intact. No fracture. Pubic symphysis and sacroiliac joints are congruent. Both femoral heads are well-seated in the respective acetabula. Right hip: No acute fracture. Femoral head is well seated, no dislocation. Mild acetabular spurring. Peripheral vascular calcifications are seen. Mild generalized soft tissue edema. IMPRESSION: 1. No fracture of the pelvis or right hip. 2. Mild degenerative change of the right hip. Electronically Signed   By: Chadwick Colonel M.D.   On: 04/12/2024 16:47   DG Hip Unilat W or Wo Pelvis 2-3 Views Right Result Date: 04/12/2024 CLINICAL DATA:  Status post fall today with right-sided pain. EXAM: DG HIP (WITH OR WITHOUT PELVIS) 2-3V RIGHT; PORTABLE PELVIS 1-2 VIEWS COMPARISON:  None Available. FINDINGS: Pelvis: The cortical margins of the bony pelvis are intact. No fracture. Pubic symphysis and sacroiliac joints are congruent. Both femoral heads are well-seated in the respective acetabula. Right hip: No acute fracture. Femoral head is well seated, no dislocation. Mild acetabular spurring. Peripheral vascular calcifications are seen. Mild generalized soft tissue edema. IMPRESSION: 1. No fracture of the pelvis or right hip. 2. Mild degenerative change of the right hip. Electronically Signed   By: Chadwick Colonel M.D.   On: 04/12/2024 16:47   DG Chest Portable 1 View Result Date: 04/12/2024 CLINICAL DATA:  Fall today on right side.  Right-sided pain. EXAM: PORTABLE CHEST 1 VIEW COMPARISON:  Radiograph 01/31/2024 FINDINGS: Lung volumes are low. Cardiomegaly is stable, post median sternotomy. Clip projecting over  the lower mediastinum likely related to prior mitral valve repair. Small to moderate right pleural effusion has increased from prior exam. No convincing pneumothorax. Skin folds project over both lower lateral chest wall. No grossly displaced rib fracture. IMPRESSION: 1. Small to moderate right pleural  effusion, increased from prior exam. 2. Low lung volumes. 3. Stable cardiomegaly. Electronically Signed   By: Chadwick Colonel M.D.   On: 04/12/2024 16:46   CT HEAD WO CONTRAST ( ) Result Date: 04/12/2024 CLINICAL DATA:  Head trauma, moderate-severe; Neck trauma (Age >= 65y). Fall from standing. EXAM: CT HEAD WITHOUT CONTRAST CT CERVICAL SPINE WITHOUT CONTRAST TECHNIQUE: Multidetector CT imaging of the head and cervical spine was performed following the standard protocol without intravenous contrast. Multiplanar CT image reconstructions of the cervical spine were also generated. RADIATION DOSE REDUCTION: This exam was performed according to the departmental dose-optimization program which includes automated exposure control, adjustment of the mA and/or kV according to patient size and/or use of iterative reconstruction technique. COMPARISON:  CT head and cervical spine 11/18/2021 FINDINGS: CT HEAD FINDINGS Brain: There is no evidence of an acute infarct, intracranial hemorrhage, mass, midline shift, or extra-axial fluid collection. Generalized cerebral atrophy is mild for age. Cerebral white matter hypodensities are unchanged and nonspecific but compatible with mild chronic small vessel ischemic disease. Vascular: Calcified atherosclerosis at the skull base. No hyperdense vessel. Skull: No acute fracture or suspicious lesion. Sinuses/Orbits: Minimal mucosal thickening in the included portions of the paranasal sinuses. Clear mastoid air cells. Bilateral cataract extraction. Other: None. CT CERVICAL SPINE FINDINGS Alignment: Chronic reversal of the normal cervical lordosis. Unchanged trace anterolisthesis of C7 on T1 and trace retrolisthesis of C5 on C6. Skull base and vertebrae: No acute fracture or suspicious lesion. Soft tissues and spinal canal: No prevertebral fluid or swelling. No visible canal hematoma. Disc levels: Advanced disc degeneration from C3-4 through T1-2 and moderate multilevel facet arthrosis.  Moderate to severe multilevel neural foraminal stenosis. No evidence of high-grade spinal canal stenosis. Upper chest: More fully evaluated on the separately reported contemporaneous CT of the chest, abdomen, and pelvis. Other: Prominent atherosclerotic calcification at the carotid bifurcations. IMPRESSION: No evidence of acute intracranial abnormality or cervical spine fracture. Electronically Signed   By: Aundra Lee M.D.   On: 04/12/2024 16:25   CT Cervical Spine Wo Contrast Result Date: 04/12/2024 CLINICAL DATA:  Head trauma, moderate-severe; Neck trauma (Age >= 65y). Fall from standing. EXAM: CT HEAD WITHOUT CONTRAST CT CERVICAL SPINE WITHOUT CONTRAST TECHNIQUE: Multidetector CT imaging of the head and cervical spine was performed following the standard protocol without intravenous contrast. Multiplanar CT image reconstructions of the cervical spine were also generated. RADIATION DOSE REDUCTION: This exam was performed according to the departmental dose-optimization program which includes automated exposure control, adjustment of the mA and/or kV according to patient size and/or use of iterative reconstruction technique. COMPARISON:  CT head and cervical spine 11/18/2021 FINDINGS: CT HEAD FINDINGS Brain: There is no evidence of an acute infarct, intracranial hemorrhage, mass, midline shift, or extra-axial fluid collection. Generalized cerebral atrophy is mild for age. Cerebral white matter hypodensities are unchanged and nonspecific but compatible with mild chronic small vessel ischemic disease. Vascular: Calcified atherosclerosis at the skull base. No hyperdense vessel. Skull: No acute fracture or suspicious lesion. Sinuses/Orbits: Minimal mucosal thickening in the included portions of the paranasal sinuses. Clear mastoid air cells. Bilateral cataract extraction. Other: None. CT CERVICAL SPINE FINDINGS Alignment: Chronic reversal of the normal cervical lordosis. Unchanged trace anterolisthesis of C7 on T1  and trace retrolisthesis of C5 on C6. Skull base and vertebrae: No acute fracture or suspicious lesion. Soft tissues and spinal canal: No prevertebral fluid or swelling. No visible canal hematoma. Disc levels: Advanced disc degeneration from C3-4 through T1-2 and moderate multilevel facet arthrosis. Moderate to severe multilevel neural foraminal stenosis. No evidence of high-grade spinal canal stenosis. Upper chest: More fully evaluated on the separately reported contemporaneous CT of the chest, abdomen, and pelvis. Other: Prominent atherosclerotic calcification at the carotid bifurcations. IMPRESSION: No evidence of acute intracranial abnormality or cervical spine fracture. Electronically Signed   By: Aundra Lee M.D.   On: 04/12/2024 16:25    Anti-infectives: Anti-infectives (From admission, onward)    None        Assessment/Plan 89yo GLF   R rib FX 7,8,10, chronic looking R pleural effusion - comfortable on RA, recommend multimodal pain control, pulmonary toilet. PT/OT. Can resume DOAC if no interventions planned by Ortho.  Right coracoid process fx - Sling. Ortho consult.  Remainder of care per primary.  Trauma will sign off. Please call back with questions or concerns.   I reviewed nursing notes, ED provider notes, hospitalist notes, last 24 h vitals and pain scores, last 48 h intake and output, last 24 h labs and trends, and last 24 h imaging results.   LOS: 0 days    Delton Filbert, [facility] Surgery 04/13/2024, 9:54 AM Please see Amion for pager number during day hours 7:00am-4:30pm

## 2024-04-13 NOTE — Hospital Course (Addendum)
 89 y.o. male with medical history significant for A-fib on Eliquis , CAD/NSTEMI s/p CABG, CKD 4, anemia, HLD,Carotid art dis, PVD  HTN, gout, GERD, OSA on CPAP, mitral regurg s/p MVR, HFrEF and T2DM who presents to the ED valuation after a fall while making oatmeal in the kitchen landing on the right shoulder, no loss of consciousness. In the ED hemodynamically stable,blood glucose 248, BUN/creatinine 39/3.28, WBC 6.1, Hgb 9.3, platelet 141.  EKG shows A-fib with a rate of 65 and RBBB.  Trauma scan reveals nondisplaced fracture to the right coracoid process with nondisplaced fractures of the posterior right 10th rib and anterior right 7th and 8th ribs with moderate volume right pleural effusion, A right shoulder immobilizer/sling was placed.  Trauma surgery was consulted and admitted. Managing with sling for his right coracoid process fracture, pain control on his rib fracture with multimodal management. Weak and deconditioned and recommending skilled nursing facility.  Waiting for placement Overall patient remains hemodynamically stable waiting for placement  Subjective: Alert and awake Eager to go to snf today No complaints Overnight afebrile BP stable labs creatinine about the same 3.2 hemoglobin 9.9 No new changes  Discharge Diagnoses:  Mechanical fall at home Nondisplaced fracture of right coracoid process Right rib fractures 7, 8, 10 Chronic looking right pleural effusion: Trauma surgery input appreciated. Patient pain controlled on RA. Continue with multimodal pain control pulmonary toileting PT OT Cont sling Rt arm as per Sports [facility] as OP Per trauma resumed DOAC 04/13/24 doing ok.   Acute on chronic systolic heart failure Pleural effusions chronic appearing: Presenting with lower extremity swelling and fatigue with ambulation but no significant shortness of breath,BNP elevated in 3000  CXR with moderate R pleural effusion managed with IV Lasix , home metoprolol  Overall volume  status improved, now on PO home  torsemide   Cont to monitor daily Net IO Since Admission: -2,260 mL [04/16/24 0940]  Filed Weights   04/12/24 1351 04/12/24 1354  Weight: 72.1 kg 72.1 kg    Recent Labs  Lab 04/12/24 1417 04/12/24 1814 04/13/24 0623 04/14/24 0606 04/15/24 0646 04/16/24 0619  BNP  --  2,960.9*  --   --   --   --   BUN 36*  --  36* 41* 39* 35*  CREATININE 3.20*  --  3.21* 3.15* 3.27* 3.21*  K 4.4  --  4.6 4.5 4.5 4.1  MG  --   --  2.6*  --   --   --    PVD Patient with cool extremities and nonpalpable but dopplerable pedal pulses W/ small superficial ulcer on the right big toe Endorsed weakness in lower extremity with ambulation but no pain. On statins eliquis  and statins. ABI completed right with mild right lower extremity arterial disease left also mild, unable to obtain TBI due to grade 2 ulceration.  AKI on CKD vs ckd-progressive CKD: creatinine has been  in low 3s. Patient tolerating diuresis continue torsemide  monitor renal function as outpatient follow-up with PCP/nephrology He is likely at new baseline  Persistent A-fib: HR stable, Continue Toprol -XL and resuming Eliqui  T2DM: Last  A1c 6.4% 2 months ago.  Continue sliding scale insulin , Jardiance  Recent Labs  Lab 04/12/24 2234  GLUCAP 173*    CAD/NSTEMI s/p CABG Recent stent of diagonal branch vein graft. in 02/02/2024 No chest pain cont home rosuvastatin , Ranexa  and Imdur . He was recommended to continue aspirin  81 mg x 30 days then stop, recommended clopidogrel  75 mg daily x 6 months as tolerated. As per patient  he no longer takes plavix  due to gum bleeding and significant blood clot in mouth on waking up in morning that he had to bite th cot to get it out-and he had spoken to Dr Arlester Ladd and was ok to stay off since its been >40 days since his cath per patient. He refused plavix  that was resumed 5/31> discussed with cardiology at this time he needs to at least continue aspirin  continue Eliquis .  Follow-up  with cardiology as outpatient Per pharmacy Ranexa  has been discontinued due to CKD-primary cardiology notified   HTN: BP initially soft on admission.controlled on hydralazine , Toprol  XL, Ranexa  and Imdur    Macrocytic anemia Anemia of CKD Thrombocytopenia Stable hb w/ baseline ~ 9-10.Vitamin B12 of 464 2 months ago.iron studies ok Recent Labs    02/08/24 1300 04/12/24 1351 04/12/24 1417 04/13/24 0623 04/14/24 0606 04/15/24 0646 04/16/24 0619  HGB 10.1*   < > 9.9* 10.0* 10.0* 10.3* 9.9*  MCV 96.4   < >  --  102.0* 101.3* 100.6* 101.6*  VITAMINB12 464  --   --   --   --   --   --   FERRITIN  --   --   --  82  --   --   --   TIBC  --   --   --  393  --   --   --   IRON  --   --   --  45  --   --   --    < > = values in this interval not displayed.    HLD Continue rosuvastatin  and omega-3 fatty acids   Gout Continue allopurinol    OSA Continue CPAP at night  Coccyx pressure ulcer present on admission stage II : ontinue offloading wound care Pressure Injury 04/12/24 Coccyx Stage 2 -  Partial thickness loss of dermis presenting as a shallow open injury with a red, pink wound bed without slough. (Active)  04/12/24 2230  Location: Coccyx  Location Orientation:   Staging: Stage 2 -  Partial thickness loss of dermis presenting as a shallow open injury with a red, pink wound bed without slough.  Wound Description (Comments):   Present on Admission: Yes

## 2024-04-13 NOTE — Evaluation (Addendum)
 Occupational Therapy Evaluation Patient Details Name: [name] MRN: [id number] DOB: [birth year] Today's Date: 04/13/2024   History of Present Illness   Pt is 89 y.o. male presented to [facility] on 04/12/24 due to fall at home, on anticoagulation. W/u revealed Rt 7, 8, 10 rib fx's, Rt nondisplaced coracoid process fx, and Rt pleural effusion. on  01/31/24 for hypoglycemia due to taking too much insulin . PMH significant for A-fib on Eliquis , CAD/NSTEMI s/p CABG, CKD 4, anemia, HLD, CAD, PVD, HTN, gout, GERD, OSA on CPAP, mitral regurg s/p MVR, HFrEF and T2DM .     Clinical Impressions Pt admitted based on above, and was seen based on problem list below. PTA pt was living alone and was independent with ADLs, however endorsing hx of falls. Today pt is requiring min to mod +2 assist  for ADLs. Functional transfers are mod +2  HH assist. No noted edema in RUE, digit AROM and grip strength WFL. Throughout session pt requiring frequent cues for maintaining no AROM of shoulder. Pt showing signs of decreased stm and problem solving skills. Pt would benefit from <3 hours of skilled rehab daily. OT will continue to follow acutely to maximize functional independence.     If plan is discharge home, recommend the following:   Two people to help with walking and/or transfers;A lot of help with bathing/dressing/bathroom     Functional Status Assessment   Patient has had a recent decline in their functional status and demonstrates the ability to make significant improvements in function in a reasonable and predictable amount of time.     Equipment Recommendations   Other (comment) (Defer to next venue)     Recommendations for Other Services         Precautions/Restrictions   Precautions Precautions: Fall (Family reports that patient has had 3-4 falls over the last 5 months, pt reports 3 in last year) Recall of Precautions/Restrictions: Intact Precaution/Restrictions Comments: Rt coracoid  process fx Required Braces or Orthoses: Sling Restrictions Weight Bearing Restrictions Per Provider Order: Yes RUE Weight Bearing Per Provider Order: Non weight bearing     Mobility Bed Mobility Overal bed mobility: Needs Assistance Bed Mobility: Supine to Sit     Supine to sit: Min assist, Used rails, HOB elevated     General bed mobility comments: cues to sequence & maintain NWB RUE    Transfers Overall transfer level: Needs assistance Equipment used: 2 person hand held assist Transfers: Sit to/from Stand, Bed to chair/wheelchair/BSC Sit to Stand: Mod assist, +2 safety/equipment, +2 physical assistance     Step pivot transfers: Mod assist, +2 physical assistance, +2 safety/equipment     General transfer comment: cues for sequencing, heavy reliance on LUE      Balance Overall balance assessment: Needs assistance Sitting-balance support: Feet supported Sitting balance-Leahy Scale: Good     Standing balance support: During functional activity, Single extremity supported Standing balance-Leahy Scale: Poor Standing balance comment: reliant on LUE and external support         ADL either performed or assessed with clinical judgement   ADL Overall ADL's : Needs assistance/impaired Eating/Feeding: Minimal assistance;Sitting   Grooming: Minimal assistance;Sitting   Upper Body Bathing: Minimal assistance;Sitting   Lower Body Bathing: Moderate assistance;+2 for physical assistance;Sit to/from stand   Upper Body Dressing : Minimal assistance;Sitting   Lower Body Dressing: Moderate assistance;Sit to/from stand;+2 for physical assistance   Toilet Transfer: Moderate assistance;+2 for physical assistance;Stand-pivot;BSC/3in1 Toilet Transfer Details (indicate cue type and reason): +2 HH assist Toileting-  Clothing Manipulation and Hygiene: Total assistance;+2 for physical assistance;Sit to/from stand Toileting - Clothing Manipulation Details (indicate cue type and  reason): Pt depending on UE support to maintain standing while OT performed hygiene     Functional mobility during ADLs: Moderate assistance;+2 for physical assistance General ADL Comments: Pt requiring at least min assist for most ADLs, heavy cueing to not use RUE     Vision Baseline Vision/History: 0 No visual deficits Vision Assessment?: No apparent visual deficits            Pertinent Vitals/Pain Pain Assessment Pain Assessment: Faces Faces Pain Scale: Hurts little more Pain Location: Rt ribs Pain Descriptors / Indicators: Aching, Discomfort Pain Intervention(s): Limited activity within patient's tolerance     Extremity/Trunk Assessment Upper Extremity Assessment Upper Extremity Assessment: RUE deficits/detail;Right hand dominant RUE Deficits / Details: Rt coracoid process fx, no edema noted in hand or wrist, fingers WFL RUE: Unable to fully assess due to immobilization RUE Sensation: decreased light touch RUE Coordination: decreased fine motor   Lower Extremity Assessment Lower Extremity Assessment: Defer to PT evaluation   Cervical / Trunk Assessment Cervical / Trunk Assessment: Kyphotic   Communication Communication Communication: Impaired Factors Affecting Communication: Hearing impaired   Cognition Arousal: Alert Behavior During Therapy: WFL for tasks assessed/performed Cognition: Cognition impaired     Awareness: Intellectual awareness intact, Online awareness impaired Memory impairment (select all impairments): Short-term memory   Executive functioning impairment (select all impairments): Problem solving OT - Cognition Comments: Pt with decreased problem solving, aware of restrictions of RUE, unable to follow them       Following commands: Intact       Cueing  General Comments   Cueing Techniques: Verbal cues  VSS on RA           Home Living Family/patient expects to be discharged to:: Private residence Living Arrangements:  Alone Available Help at Discharge: Family;Available PRN/intermittently Type of Home: House Home Access: Stairs to enter [employer] of Steps: 3 Entrance Stairs-Rails: Right Home Layout: One level;Other (Comment) (Ramps to get into sunken areas)     Bathroom Shower/Tub: Producer, television/film/video: Handicapped height Bathroom Accessibility: Yes How Accessible: Accessible via walker Home Equipment: Rolling Walker (2 wheels);Crutches;Toilet riser;Lift chair;Grab bars - tub/shower;Cane - single point          Prior Functioning/Environment Prior Level of Function : Independent/Modified Independent     Mobility Comments: Uses RW, wasn't using RW when fell      OT Problem List: Decreased range of motion;Decreased strength;Decreased activity tolerance;Impaired balance (sitting and/or standing);Decreased safety awareness;Decreased knowledge of use of DME or AE;Impaired UE functional use   OT Treatment/Interventions: Self-care/ADL training;Therapeutic exercise;Energy conservation;DME and/or AE instruction;Patient/family education;Balance training;Therapeutic activities      OT Goals(Current goals can be found in the care plan section)   Acute Rehab OT Goals Patient Stated Goal: To get better OT Goal Formulation: With patient Time For Goal Achievement: 04/27/24 Potential to Achieve Goals: Good   OT Frequency:  Min 2X/week    Co-evaluation PT/OT/SLP Co-Evaluation/Treatment: Yes Reason for Co-Treatment: For patient/therapist safety;To address functional/ADL transfers   OT goals addressed during session: ADL's and self-care      AM-PAC OT "6 Clicks" Daily Activity     Outcome Measure Help from another person eating meals?: A Little Help from another person taking care of personal grooming?: A Little Help from another person toileting, which includes using toliet, bedpan, or urinal?: Total Help from another person bathing (including washing,  rinsing, drying)?:  A Lot Help from another person to put on and taking off regular upper body clothing?: A Little Help from another person to put on and taking off regular lower body clothing?: A Lot 6 Click Score: 14   End of Session Equipment Utilized During Treatment: Gait belt Nurse Communication: Mobility status  Activity Tolerance: Patient tolerated treatment well Patient left: in chair;with call bell/phone within reach;with chair alarm set;with family/visitor present  OT Visit Diagnosis: Unsteadiness on feet (R26.81);Other abnormalities of gait and mobility (R26.89);Repeated falls (R29.6);Muscle weakness (generalized) (M62.81)                Time: 9381-8299 (10 minutes not billable)) OT Time Calculation (min): 48 min Charges:  OT General Charges $OT Visit: 1 Visit OT Evaluation $OT Eval Low Complexity: 1 Low  Delmer Ferraris, OT  Acute Rehabilitation Services Office [phone] Secure chat preferred   Mickael Alamo 04/13/2024, 12:20 PM

## 2024-04-14 LAB — BASIC METABOLIC PANEL WITH GFR
Anion gap: 8 (ref 5–15)
BUN: 41 mg/dL — ABNORMAL HIGH (ref 8–23)
CO2: 26 mmol/L (ref 22–32)
Calcium: 9.3 mg/dL (ref 8.9–10.3)
Chloride: 101 mmol/L (ref 98–111)
Creatinine, Ser: 3.15 mg/dL — ABNORMAL HIGH (ref 0.61–1.24)
GFR, Estimated: 18 mL/min — ABNORMAL LOW (ref >60)
Glucose, Bld: 154 mg/dL — ABNORMAL HIGH (ref 70–99)
Potassium: 4.5 mmol/L (ref 3.5–5.1)
Sodium: 135 mmol/L (ref 135–145)

## 2024-04-14 LAB — CBC
HCT: 31.6 % — ABNORMAL LOW (ref 39.0–52.0)
Hemoglobin: 10 g/dL — ABNORMAL LOW (ref 13.0–17.0)
MCH: 32.1 pg (ref 26.0–34.0)
MCHC: 31.6 g/dL (ref 30.0–36.0)
MCV: 101.3 fL — ABNORMAL HIGH (ref 80.0–100.0)
Platelets: 135 10*3/uL — ABNORMAL LOW (ref 150–400)
RBC: 3.12 MIL/uL — ABNORMAL LOW (ref 4.22–5.81)
RDW: 15.9 % — ABNORMAL HIGH (ref 11.5–15.5)
WBC: 5.2 10*3/uL (ref 4.0–10.5)
nRBC: 0 % (ref 0.0–0.2)

## 2024-04-14 NOTE — NC FL2 (Signed)
 [place]  MEDICAID [id number] LEVEL OF CARE FORM     IDENTIFICATION  Patient Name: [name] Birthdate: [birth year] Sex: male Admission Date (Current Location): 04/12/2024  [facility] and [place] Number:  Producer, television/film/video and Address:  [facility] [place], [address] [place], [place] [address]      Provider Number: [id number]  Attending Physician Name and Address:  Lesa Rape, MD  Relative Name and Phone Number:  Russ Course (daughter) [phone]    Current Level of Care: Hospital Recommended Level of Care: Skilled Nursing Facility Prior Approval Number:    Date Approved/Denied:   PASRR Number: [id number]  Discharge Plan: SNF    Current Diagnoses: Patient Active Problem List   Diagnosis Date Noted   Multiple closed fractures of ribs of right side 04/13/2024   PVD (peripheral vascular disease) ([name]) 04/13/2024   Acute renal failure superimposed on stage 4 chronic kidney disease ([name]) 04/13/2024   Acute on chronic systolic CHF (congestive heart failure) ([name]) 04/13/2024   Fall 04/12/2024   Urticaria 02/12/2024   Neuropathic pain 02/11/2024   Diabetic neuropathy with neurologic complication ([name]) 02/11/2024   S/P coronary artery stent placement 02/03/2024   Atherosclerosis of native coronary artery of native heart without angina pectoris 02/03/2024   Diastolic congestive heart failure ([name]) 02/01/2024   Chronic atrial fibrillation ([name]) 02/01/2024   Hypoglycemia 02/01/2024   Hypoglycemia associated with diabetes ([name]) 01/31/2024   Chronic systolic CHF (congestive heart failure) ([name]) 01/31/2024   DNR (do not resuscitate) 01/31/2024   Aortic stenosis 12/19/2023   Healthcare maintenance 11/22/2023   Shoulder pain 11/22/2023   Prolonged QT interval 08/18/2023   Gastritis and gastroduodenitis 08/18/2023   Duodenal ulcer 08/18/2023   Bilateral lower extremity edema 03/01/2023   Skin lesion 11/06/2022   Laceration of right lower extremity 11/25/2021    Rhinitis 11/01/2020   Non-rheumatic mitral regurgitation 02/06/2020   H/O mitral valve repair 02/06/2020   Permanent atrial fibrillation ([name])    CAD (coronary artery disease) 08/02/2018   Macular degeneration 06/24/2018   Abdominal wall hernia 05/24/2017   History of lower GI bleeding    Pulmonary hypertension ([name])    HFrEF (heart failure with reduced ejection fraction) ([name]) 11/20/2015   Anemia    Gout 09/09/2014   Advance care planning 09/09/2014   Chronic anticoagulation    RBBB (right bundle branch block)    Carotid artery disease ([name])    Hx of CABG    CKD (chronic kidney disease) stage 4, GFR 15-29 ml/min ([name]) 03/24/2011   Peripheral sensory neuropathy due to type 2 diabetes mellitus ([name])    GERD    Sleep apnea    HLD (hyperlipidemia)    HTN (hypertension)     Orientation RESPIRATION BLADDER Height & Weight     Self, Time, Situation, Place  Normal Continent Weight: 159 lb (72.1 kg) Height:  5\' 7"  (170.2 cm)  BEHAVIORAL SYMPTOMS/MOOD NEUROLOGICAL BOWEL NUTRITION STATUS      Continent Diet (Please see discharge summary)  AMBULATORY STATUS COMMUNICATION OF NEEDS Skin   Extensive Assist Verbally Other (Comment) (Abrasion,Leg,Foot,Bil.,Ecchymosis,Arm,Leg,Bil.,Erythema,Leg,Bil.,Wound/Incision LDAs,PI coccyx stage 2)                       Personal Care Assistance Level of Assistance  Bathing, Feeding, Dressing Bathing Assistance: Maximum assistance Feeding assistance: Limited assistance Dressing Assistance: Maximum assistance     Functional Limitations Info  Sight, Hearing, Speech   Hearing Info: Impaired (Hearing aids) Speech Info: Adequate  SPECIAL CARE FACTORS FREQUENCY  PT (By licensed PT), OT (By licensed OT)     PT Frequency: 5x min weekly OT Frequency: 5x min weekly            Contractures Contractures Info: Not present    Additional Factors Info  Code Status, Allergies Code Status Info: DNR Allergies Info: Brilinta   (ticagrelor ),Nsaids           Current Medications (04/14/2024):  This is the current hospital active medication list Current Facility-Administered Medications  Medication Dose Route Frequency Provider Last Rate Last Admin   acetaminophen  (TYLENOL ) tablet 1,000 mg  1,000 mg Oral TID Amponsah, Prosper M, MD   1,000 mg at 04/14/24 0607   [START ON 04/15/2024] allopurinol  (ZYLOPRIM ) tablet 50 mg  50 mg Oral Q M,W,F Amponsah, Prosper M, MD       apixaban  (ELIQUIS ) tablet 2.5 mg  2.5 mg Oral BID Amponsah, Prosper M, MD   2.5 mg at 04/14/24 0957   cholecalciferol  (VITAMIN D3) 25 MCG (1000 UNIT) tablet 1,000 Units  1,000 Units Oral QHS Vita Grip, MD   1,000 Units at 04/13/24 2205   empagliflozin  (JARDIANCE ) tablet 10 mg  10 mg Oral Daily Amponsah, Prosper M, MD   10 mg at 04/14/24 7829   furosemide  (LASIX ) injection 40 mg  40 mg Intravenous Daily Amponsah, Prosper M, MD   40 mg at 04/14/24 5621   hydrALAZINE  (APRESOLINE ) tablet 25 mg  25 mg Oral Daily Amponsah, Prosper M, MD   25 mg at 04/14/24 0957   HYDROmorphone  (DILAUDID ) injection 0.5 mg  0.5 mg Intravenous Q6H PRN Amponsah, Prosper M, MD       ipratropium (ATROVENT ) 0.06 % nasal spray 2 spray  2 spray Each Nare BID PRN Vita Grip, MD       isosorbide  mononitrate (IMDUR ) 24 hr tablet 90 mg  90 mg Oral Daily Amponsah, Prosper M, MD   90 mg at 04/14/24 3086   lidocaine  (LIDODERM ) 5 % 1 patch  1 patch Transdermal Q24H Vita Grip, MD   1 patch at 04/12/24 1959   lidocaine  (LIDODERM ) 5 % 1 patch  1 patch Transdermal Q24H Kc, Ramesh, MD   1 patch at 04/13/24 1428   magnesium  oxide (MAG-OX) tablet 400 mg  400 mg Oral QODAY Amponsah, Prosper M, MD   400 mg at 04/13/24 1154   metoprolol  succinate (TOPROL -XL) 24 hr tablet 100 mg  100 mg Oral Daily Amponsah, Prosper M, MD   100 mg at 04/14/24 0954   omega-3 acid ethyl esters (LOVAZA ) capsule 1 g  1 g Oral QHS Amponsah, Prosper M, MD   1 g at 04/13/24 2205   ondansetron  (ZOFRAN )  tablet 4 mg  4 mg Oral Q6H PRN Amponsah, Prosper M, MD       Or   ondansetron  (ZOFRAN ) injection 4 mg  4 mg Intravenous Q6H PRN Vita Grip, MD       oxyCODONE  (Oxy IR/ROXICODONE ) immediate release tablet 5 mg  5 mg Oral Q6H PRN Amponsah, Prosper M, MD   5 mg at 04/13/24 0845   polyethylene glycol (MIRALAX  / GLYCOLAX ) packet 17 g  17 g Oral Daily Amponsah, Prosper M, MD   17 g at 04/14/24 5784   potassium chloride  SA (KLOR-CON  M) CR tablet 20 mEq  20 mEq Oral Daily Amponsah, Prosper M, MD   20 mEq at 04/14/24 0954   ranolazine  (RANEXA ) 12 hr tablet 500 mg  500 mg Oral BID Amponsah,  Prosper M, MD   500 mg at 04/14/24 9629   rosuvastatin  (CRESTOR ) tablet 10 mg  10 mg Oral Daily Amponsah, Prosper M, MD   10 mg at 04/14/24 0957   senna-docusate (Senokot-S) tablet 1 tablet  1 tablet Oral QHS PRN Amponsah, Prosper M, MD         Discharge Medications: Please see discharge summary for a list of discharge medications.  Relevant Imaging Results:  Relevant Lab Results:   Additional Information [id number]  Carmon Christen, LCSWA

## 2024-04-14 NOTE — TOC Initial Note (Signed)
 Transition of Care [facility]) - Initial/Assessment Note    Patient Details  Name: [name] MRN: [id number] Date of Birth: Jun 24, 1932  [facility] CM/SW Contact:    Carmon Christen, LCSWA Phone Number: 04/14/2024, 11:17 AM  Clinical Narrative:                  CSW received consult for possible SNF placement at time of discharge. CSW spoke with patient regarding PT recommendation of SNF placement at time of discharge. Patient reports PTA he comes from home alone. Patient expressed understanding of PT recommendation and is agreeable to SNF placement at time of discharge. Patient gave CSW permission to fax out initial referral for SNF placement. CSW discussed insurance authorization process and will provide Medicare SNF ratings list with accepted SNF bed offers. No further questions reported at this time. CSW to continue to follow and assist with discharge planning needs.   Expected Discharge Plan: Skilled Nursing Facility Barriers to Discharge: Continued Medical Work up   Patient Goals and CMS Choice Patient states their goals for this hospitalization and ongoing recovery are:: SNF   Choice offered to / list presented to : Patient      Expected Discharge Plan and Services In-house Referral: Clinical Social Work     Living arrangements for the past 2 months: Single Family Home                                      Prior Living Arrangements/Services Living arrangements for the past 2 months: Single Family Home Lives with:: Self Patient language and need for interpreter reviewed:: Yes Do you feel safe going back to the place where you live?: No   SNF  Need for Family Participation in Patient Care: Yes (Comment) Care giver support system in place?: Yes (comment)   Criminal Activity/Legal Involvement Pertinent to Current Situation/Hospitalization: No - Comment as needed  Activities of Daily Living      Permission Sought/Granted Permission sought to share  information with : Case Manager, Family Supports, Magazine features editor Permission granted to share information with : Yes, Verbal Permission Granted  Share Information with NAME: Patty  Permission granted to share info w AGENCY: SNF  Permission granted to share info w Relationship: daughter  Permission granted to share info w Contact Information: Russ Course (daughter) [phone]  Emotional Assessment   Attitude/Demeanor/Rapport: Gracious Affect (typically observed): Calm Orientation: : Oriented to Self, Oriented to Place, Oriented to  Time, Oriented to Situation Alcohol / Substance Use: Not Applicable Psych Involvement: No (comment)  Admission diagnosis:  Fall [W19.XXXA] Fall, initial encounter [id number]] Closed fracture of one rib of right side, initial encounter [S22.31XA] Patient Active Problem List   Diagnosis Date Noted   Multiple closed fractures of ribs of right side 04/13/2024   PVD (peripheral vascular disease) ([name]) 04/13/2024   Acute renal failure superimposed on stage 4 chronic kidney disease ([name]) 04/13/2024   Acute on chronic systolic CHF (congestive heart failure) ([name]) 04/13/2024   Fall 04/12/2024   Urticaria 02/12/2024   Neuropathic pain 02/11/2024   Diabetic neuropathy with neurologic complication ([name]) 02/11/2024   S/P coronary artery stent placement 02/03/2024   Atherosclerosis of native coronary artery of native heart without angina pectoris 02/03/2024   Diastolic congestive heart failure ([name]) 02/01/2024   Chronic atrial fibrillation ([name]) 02/01/2024   Hypoglycemia 02/01/2024   Hypoglycemia associated with diabetes ([name]) 01/31/2024   Chronic systolic  CHF (congestive heart failure) ([name]) 01/31/2024   DNR (do not resuscitate) 01/31/2024   Aortic stenosis 12/19/2023   Healthcare maintenance 11/22/2023   Shoulder pain 11/22/2023   Prolonged QT interval 08/18/2023   Gastritis and gastroduodenitis 08/18/2023   Duodenal ulcer 08/18/2023   Bilateral  lower extremity edema 03/01/2023   Skin lesion 11/06/2022   Laceration of right lower extremity 11/25/2021   Rhinitis 11/01/2020   Non-rheumatic mitral regurgitation 02/06/2020   H/O mitral valve repair 02/06/2020   Permanent atrial fibrillation ([name])    CAD (coronary artery disease) 08/02/2018   Macular degeneration 06/24/2018   Abdominal wall hernia 05/24/2017   History of lower GI bleeding    Pulmonary hypertension ([name])    HFrEF (heart failure with reduced ejection fraction) ([name]) 11/20/2015   Anemia    Gout 09/09/2014   Advance care planning 09/09/2014   Chronic anticoagulation    RBBB (right bundle branch block)    Carotid artery disease ([name])    Hx of CABG    CKD (chronic kidney disease) stage 4, GFR 15-29 ml/min ([name]) 03/24/2011   Peripheral sensory neuropathy due to type 2 diabetes mellitus ([name])    GERD    Sleep apnea    HLD (hyperlipidemia)    HTN (hypertension)    PCP:  Donnie Galea, MD Pharmacy:   [facility] [phone] Jonette Nestle, [place] - [address] AT [facility] [address] [place] [address] Phone: [phone] Fax: [phone]  [facility] - [place], [place] - [address] [place] [address] Phone: [phone] Fax: [phone]     Social Drivers of Health (SDOH) Social History: SDOH Screenings   Food Insecurity: No Food Insecurity (04/13/2024)  Housing: Low Risk  (04/13/2024)  Transportation Needs: No Transportation Needs (04/13/2024)  Utilities: Not At Risk (04/13/2024)  Alcohol Screen: Low Risk  (10/31/2023)  Depression (PHQ2-9): Low Risk  (02/08/2024)  Recent Concern: Depression (PHQ2-9) - Medium Risk (01/09/2024)  Financial Resource Strain: Low Risk  (10/31/2023)  Physical Activity: Inactive (10/31/2023)  Social Connections: Moderately Isolated (04/13/2024)  Stress: No Stress Concern Present (10/31/2023)  Tobacco Use: Medium Risk (03/19/2024)   Health Literacy: Adequate Health Literacy (10/31/2023)   SDOH Interventions:     Readmission Risk Interventions     No data to display

## 2024-04-14 NOTE — Progress Notes (Signed)
 PROGRESS NOTE [name]  ZOX:[id number] DOB: 10/18/1932 DOA: 04/12/2024 PCP: Donnie Galea, MD  Brief Narrative/Hospital Course: 89 y.o. male with medical history significant for A-fib on Eliquis , CAD/NSTEMI s/p CABG, CKD 4, anemia, HLD,Carotid art dis, PVD  HTN, gout, GERD, OSA on CPAP, mitral regurg s/p MVR, HFrEF and T2DM who presents to the ED valuation after a fall while making oatmeal in the kitchen landing on the right shoulder, no loss of consciousness. In the ED hemodynamically stable,blood glucose 248, BUN/creatinine 39/3.28, WBC 6.1, Hgb 9.3, platelet 141.  EKG shows A-fib with a rate of 65 and RBBB.  Trauma scan reveals nondisplaced fracture to the right coracoid process with nondisplaced fractures of the posterior right 10th rib and anterior right 7th and 8th ribs with moderate volume right pleural effusion, A right shoulder immobilizer/sling was placed.  Trauma surgery was consulted and admitted.  Subjective: Patient seen and examined this morning Alert awake feels improved doing well Resting well, no arm pain. Some chest pain on deep breath on RA but overall better. Labs shows BUN 36>41, creat 3.2> 3.15  CBC with chronic anemia thrombocytopenia.stable  Assessment and plan:  Mechanical fall at home Nondisplaced fracture of right coracoid process Right rib fractures 7, 8, 10 Chronic looking right pleural effusion: Trauma surgery input appreciated. Patient needs comfortable on RA. Continue with multimodal pain control pulmonary toileting PT OT- Cont sling Pain control  plan for Sports Ortho as OP Per trauma resumed DOAC 04/13/24.   Acute on chronic systolic heart failure Pleural effusions chronic appearing: Presenting with lower extremity swelling and fatigue with ambulation but no significant shortness of breath, BNP elevated in 3000 > CXR with moderate R pleural effusion. Continue IV Lasix  -net output as below continue Toprol  transition to diuretics p.o. tomorrow  Cont  to monitor daily I/O,weight, electrolytes and net balance as below.Keep on  salt/fluid restricted diet and monitor in tele. Net IO Since Admission: -890 mL [04/14/24 0953]  Filed Weights   04/12/24 1351 04/12/24 1354  Weight: 72.1 kg 72.1 kg    Recent Labs  Lab 04/12/24 1351 04/12/24 1417 04/12/24 1814 04/13/24 0623 04/14/24 0606  BNP  --   --  2,960.9*  --   --   BUN 39* 36*  --  36* 41*  CREATININE 3.28* 3.20*  --  3.21* 3.15*  K 4.4 4.4  --  4.6 4.5  MG  --   --   --  2.6*  --    PVDL Patient with cool extremities and nonpalpable but dopplerable pedal pulses W/ small superficial ulcer on the right big toe Endorsed weakness in lower extremity with ambulation but no pain. On statins ABI completed right with mild right lower extremity arterial disease left also mild, unable to obtain TBI due to grade 2 ulceration.  AKI on CKD 4 baseline of 2.6-2.9.AKI likely in the setting of acute CHF exacerbation/cardiorenal syndrome. Continue diuresis as above avoid nephrotoxic medication, monitor renal function-creatinine slightly better  Persistent A-fib: HR stable, Continue Toprol -XL and resuming Eliqui  T2DM: Last  A1c 6.4% 2 months ago.  Continue sliding scale insulin , Jardiance  Recent Labs  Lab 04/12/24 2234  GLUCAP 173*    CAD/NSTEMI s/p CABG Recent stent of diagonal branch vein graft. in 02/02/2024 No chest pain cont home rosuvastatin , Ranexa  and Imdur . He was recommended to continue aspirin  81 mg x 30 days then stop, recommended clopidogrel  75 mg daily x 6 months as tolerated. As per patient he no longer takes plavix  due  to gum bleeding and significant blood clot in mouth on waking up in morning that he had to bite th cot to get it out-and he had spoken to Dr Arlester Ladd and was ok to stay off since its been >40 days since his cath per patient. He refused plavix  that was resumed 5/31. I have notified and requested cardiology input for recommendation   HTN: BP initially soft on  admission.controlled on hydralazine , Toprol  XL, Ranexa  and Imdur    Macrocytic anemia Anemia of CKD Thrombocytopenia Stable hb w/ baseline ~ 9-10.Vitamin B12 of 464 2 months ago.iron studies ok Recent Labs    02/08/24 1300 04/12/24 1351 04/12/24 1417 04/13/24 0623 04/14/24 0606  HGB 10.1* 9.3* 9.9* 10.0* 10.0*  MCV 96.4 103.1*  --  102.0* 101.3*  VITAMINB12 464  --   --   --   --   FERRITIN  --   --   --  82  --   TIBC  --   --   --  393  --   IRON  --   --   --  45  --       HLD Continue rosuvastatin  and omega-3 fatty acids   Gout Continue allopurinol    OSA  Continue CPAP at night   DVT prophylaxis: apixaban  (ELIQUIS ) tablet 2.5 mg Start: 04/13/24 1000 Place TED hose Start: 04/12/24 1944 Code Status:   Code Status: Limited: Do not attempt resuscitation (DNR) -DNR-LIMITED -Do Not Intubate/DNI  Family Communication: plan of care discussed with patient at bedside. Patient status is: Remains hospitalized because of severity of illness Level of care: Telemetry Medical   Dispo: The patient is from: home alone, sons nearby            Anticipated disposition: SNF 1-2 days  Objective: Vitals last 24 hrs: Vitals:   04/13/24 1426 04/13/24 1954 04/14/24 0521 04/14/24 0730  BP: 105/69 106/64 136/62 102/68  Pulse: 99 97 91 89  Resp: 16   16  Temp: (!) 97.3 F (36.3 C) (!) 97.4 F (36.3 C) (!) 97.5 F (36.4 C) (!) 97.5 F (36.4 C)  TempSrc: Oral  Oral   SpO2: 99% 98% 100% 96%  Weight:      Height:        Physical Examination: General exam: alert awake, oriented, hard of hearing HEENT:Oral mucosa moist, Ear/Nose WNL grossly Respiratory system: Bilaterally clear BS,no use of accessory muscle Cardiovascular system: S1 & S2 +, No JVD. Gastrointestinal system: Abdomen soft,NT,ND, BS+ Nervous System: Alert, awake, moving all extremities,and following commands. Extremities:  rt amr in sling, LE edema neg,distal peripheral pulses palpable and warm.  Skin: No rashes,no  icterus. MSK: Normal muscle bulk,tone, power   Data Reviewed: I have personally reviewed following labs and imaging studies ( see epic result tab) CBC: Recent Labs  Lab 04/12/24 1351 04/12/24 1417 04/13/24 0623 04/14/24 0606  WBC 6.1  --  5.7 5.2  HGB 9.3* 9.9* 10.0* 10.0*  HCT 29.9* 29.0* 31.1* 31.6*  MCV 103.1*  --  102.0* 101.3*  PLT 141*  --  141* 135*   CMP: Recent Labs  Lab 04/12/24 1351 04/12/24 1417 04/13/24 0623 04/14/24 0606  NA 135 135 137 135  K 4.4 4.4 4.6 4.5  CL 98 100 100 101  CO2 23  --  25 26  GLUCOSE 248* 245* 149* 154*  BUN 39* 36* 36* 41*  CREATININE 3.28* 3.20* 3.21* 3.15*  CALCIUM  9.3  --  9.7 9.3  MG  --   --  2.6*  --   PHOS  --   --  3.8  --    GFR: Estimated Creatinine Clearance: 14.6 mL/min (A) (by C-G formula based on SCr of 3.15 mg/dL (H)). Recent Labs  Lab 04/12/24 1351 04/13/24 0623  AST 21  --   ALT 13  --   ALKPHOS 69  --   BILITOT 0.9  --   PROT 6.8  --   ALBUMIN 3.3* 3.3*   No results for input(s): "LIPASE", "AMYLASE" in the last 168 hours. No results for input(s): "AMMONIA" in the last 168 hours. Coagulation Profile: No results for input(s): "INR", "PROTIME" in the last 168 hours. Unresulted Labs (From admission, onward)     Start     Ordered   04/14/24 0500  Basic metabolic panel with GFR  Daily,   R      04/13/24 0745   04/14/24 0500  CBC  Daily,   R      04/13/24 0745   04/12/24 1814  Urinalysis, w/ Reflex to Culture (Infection Suspected) -Urine, Clean Catch  Once,   URGENT       Question:  Specimen Source  Answer:  Urine, Clean Catch   04/12/24 1814           Antimicrobials/Microbiology: Anti-infectives (From admission, onward)    None         Component Value Date/Time   SDES BLOOD LEFT HAND 06/14/2014 2225   SPECREQUEST BOTTLES DRAWN AEROBIC ONLY 5CC 06/14/2014 2225   CULT  06/14/2014 2225    NO GROWTH 5 DAYS Performed at [facility]   REPTSTATUS 06/23/2014 FINAL 06/14/2014 2225    Medications reviewed:  Scheduled Meds:  acetaminophen   1,000 mg Oral TID   [START ON 04/15/2024] allopurinol   50 mg Oral Q M,W,F   apixaban   2.5 mg Oral BID   cholecalciferol   1,000 Units Oral QHS   empagliflozin   10 mg Oral Daily   furosemide   40 mg Intravenous Daily   hydrALAZINE   25 mg Oral Daily   isosorbide  mononitrate  90 mg Oral Daily   lidocaine   1 patch Transdermal Q24H   lidocaine   1 patch Transdermal Q24H   magnesium  oxide  400 mg Oral QODAY   metoprolol  succinate  100 mg Oral Daily   omega-3 acid ethyl esters  1 g Oral QHS   polyethylene glycol  17 g Oral Daily   potassium chloride   20 mEq Oral Daily   ranolazine   500 mg Oral BID   rosuvastatin   10 mg Oral Daily  Continuous Infusions: Lesa Rape, MD [facility] 04/14/2024, 10:02 AM

## 2024-04-14 NOTE — TOC CAGE-AID Note (Signed)
 Transition of Care [facility]) - CAGE-AID Screening   Patient Details  Name: [name] MRN: [id number] Date of Birth: 11/02/1932  [facility]) CM/SW Contact:    Juan Noel, RN Phone Number: [phone] 04/14/2024, 7:59 PM   Clinical Narrative: Pt in hospital after sustaining injuries due to a GLF.  Pt is on eliquis .  Pt denies alcohol or drug use.  Screening complete.   CAGE-AID Screening:    Have You Ever Felt You Ought to Cut Down on Your Drinking or Drug Use?: No Have People Annoyed You By Critizing Your Drinking Or Drug Use?: No Have You Felt Bad Or Guilty About Your Drinking Or Drug Use?: No Have You Ever Had a Drink or Used Drugs First Thing In The Morning to Steady Your Nerves or to Get Rid of a Hangover?: No CAGE-AID Score: 0  Substance Abuse Education Offered: No

## 2024-04-15 DIAGNOSIS — W19XXXA Unspecified fall, initial encounter: Secondary | ICD-10-CM | POA: Diagnosis not present

## 2024-04-15 DIAGNOSIS — I10 Essential (primary) hypertension: Secondary | ICD-10-CM | POA: Diagnosis not present

## 2024-04-15 LAB — CBC
HCT: 31.7 % — ABNORMAL LOW (ref 39.0–52.0)
Hemoglobin: 10.3 g/dL — ABNORMAL LOW (ref 13.0–17.0)
MCH: 32.7 pg (ref 26.0–34.0)
MCHC: 32.5 g/dL (ref 30.0–36.0)
MCV: 100.6 fL — ABNORMAL HIGH (ref 80.0–100.0)
Platelets: 155 10*3/uL (ref 150–400)
RBC: 3.15 MIL/uL — ABNORMAL LOW (ref 4.22–5.81)
RDW: 15.9 % — ABNORMAL HIGH (ref 11.5–15.5)
WBC: 5.9 10*3/uL (ref 4.0–10.5)
nRBC: 0 % (ref 0.0–0.2)

## 2024-04-15 LAB — BASIC METABOLIC PANEL WITH GFR
Anion gap: 13 (ref 5–15)
BUN: 39 mg/dL — ABNORMAL HIGH (ref 8–23)
CO2: 25 mmol/L (ref 22–32)
Calcium: 9.4 mg/dL (ref 8.9–10.3)
Chloride: 97 mmol/L — ABNORMAL LOW (ref 98–111)
Creatinine, Ser: 3.27 mg/dL — ABNORMAL HIGH (ref 0.61–1.24)
GFR, Estimated: 17 mL/min — ABNORMAL LOW (ref >60)
Glucose, Bld: 161 mg/dL — ABNORMAL HIGH (ref 70–99)
Potassium: 4.5 mmol/L (ref 3.5–5.1)
Sodium: 135 mmol/L (ref 135–145)

## 2024-04-15 MED ORDER — TORSEMIDE 20 MG PO TABS
20.0000 mg | ORAL_TABLET | Freq: Every day | ORAL | Status: DC
  Administered 2024-04-15 – 2024-04-16 (×2): 20 mg via ORAL
  Filled 2024-04-15 (×2): qty 1

## 2024-04-15 MED ORDER — ASPIRIN 81 MG PO CHEW: 81.0000 mg | CHEWABLE_TABLET | Freq: Every day | ORAL | Status: DC

## 2024-04-15 MED ORDER — ASPIRIN 81 MG PO CHEW
81.0000 mg | CHEWABLE_TABLET | Freq: Every day | ORAL | Status: DC
  Administered 2024-04-15 – 2024-04-16 (×2): 81 mg via ORAL
  Filled 2024-04-15 (×2): qty 1

## 2024-04-15 MED ORDER — LIDOCAINE 5 % EX PTCH: 1.0000 | MEDICATED_PATCH | CUTANEOUS | Status: DC

## 2024-04-15 NOTE — Progress Notes (Signed)
 PROGRESS NOTE [name]  MVH:[id number] DOB: [birth year] DOA: 04/12/2024 PCP: Donnie Galea, MD  Brief Narrative/Hospital Course: 89 y.o. male with medical history significant for A-fib on Eliquis , CAD/NSTEMI s/p CABG, CKD 4, anemia, HLD,Carotid art dis, PVD  HTN, gout, GERD, OSA on CPAP, mitral regurg s/p MVR, HFrEF and T2DM who presents to the ED valuation after a fall while making oatmeal in the kitchen landing on the right shoulder, no loss of consciousness. In the ED hemodynamically stable,blood glucose 248, BUN/creatinine 39/3.28, WBC 6.1, Hgb 9.3, platelet 141.  EKG shows A-fib with a rate of 65 and RBBB.  Trauma scan reveals nondisplaced fracture to the right coracoid process with nondisplaced fractures of the posterior right 10th rib and anterior right 7th and 8th ribs with moderate volume right pleural effusion, A right shoulder immobilizer/sling was placed.  Trauma surgery was consulted and admitted. Managing with sling for his right coracoid process fracture, pain control on his rib fracture with multimodal management. Weak and deconditioned and recommending skilled nursing facility.  Waiting for placement  Subjective: Seen examined this morning Overall doing well no complaint Overnight patient is afebrile BP stable creatinine trended up to 3.2 from 3.1 more or less the same  Hemoglobin stable at 10 g   Assessment and plan:  Mechanical fall at home Nondisplaced fracture of right coracoid process Right rib fractures 7, 8, 10 Chronic looking right pleural effusion: Trauma surgery input appreciated. Patient needs comfortable on RA. Continue with multimodal pain control pulmonary toileting PT OT- Cont sling Pain control  plan for Sports Ortho as OP Per trauma resumed DOAC 04/13/24.   Acute on chronic systolic heart failure Pleural effusions chronic appearing: Presenting with lower extremity swelling and fatigue with ambulation but no significant shortness of breath, BNP  elevated in 3000  CXR with moderate R pleural effusion management with IV Lasix , home metoprolol  Overall volume status improved, switched him to oral torsemide   Cont to monitor daily I/O,weight, electrolytes and net balance as below.Keep on  salt/fluid restricted diet and monitor in tele. Net IO Since Admission: -2,260 mL [04/15/24 1105]  Filed Weights   04/12/24 1351 04/12/24 1354  Weight: 72.1 kg 72.1 kg    Recent Labs  Lab 04/12/24 1351 04/12/24 1417 04/12/24 1814 04/13/24 0623 04/14/24 0606 04/15/24 0646  BNP  --   --  2,960.9*  --   --   --   BUN 39* 36*  --  36* 41* 39*  CREATININE 3.28* 3.20*  --  3.21* 3.15* 3.27*  K 4.4 4.4  --  4.6 4.5 4.5  MG  --   --   --  2.6*  --   --    PVD Patient with cool extremities and nonpalpable but dopplerable pedal pulses W/ small superficial ulcer on the right big toe Endorsed weakness in lower extremity with ambulation but no pain. On statins eliquis  and statins. ABI completed right with mild right lower extremity arterial disease left also mild, unable to obtain TBI due to grade 2 ulceration.  Aki vs ckd-progressive CKD: creatinine has been holding in low 3. ? cardiorenal syndrome. Continue diuresis as above avoid nephrotoxic medication, monitor renal function-creatinine slightly better  Persistent A-fib: HR stable, Continue Toprol -XL and resuming Eliqui  T2DM: Last  A1c 6.4% 2 months ago.  Continue sliding scale insulin , Jardiance  Recent Labs  Lab 04/12/24 2234  GLUCAP 173*    CAD/NSTEMI s/p CABG Recent stent of diagonal branch vein graft. in 02/02/2024 No chest pain cont home  rosuvastatin , Ranexa  and Imdur . He was recommended to continue aspirin  81 mg x 30 days then stop, recommended clopidogrel  75 mg daily x 6 months as tolerated. As per patient he no longer takes plavix  due to gum bleeding and significant blood clot in mouth on waking up in morning that he had to bite th cot to get it out-and he had spoken to Dr Arlester Ladd and was  ok to stay off since its been >40 days since his cath per patient. He refused plavix  that was resumed 5/31> discussed with cardiology at this time he needs to at least continue aspirin  continue Eliquis .  Follow-up with cardiology as outpatient   HTN: BP initially soft on admission.controlled on hydralazine , Toprol  XL, Ranexa  and Imdur    Macrocytic anemia Anemia of CKD Thrombocytopenia Stable hb w/ baseline ~ 9-10.Vitamin B12 of 464 2 months ago.iron studies ok Recent Labs    02/08/24 1300 04/12/24 1351 04/12/24 1417 04/13/24 0623 04/14/24 0606 04/15/24 0646  HGB 10.1* 9.3* 9.9* 10.0* 10.0* 10.3*  MCV 96.4 103.1*  --  102.0* 101.3* 100.6*  VITAMINB12 464  --   --   --   --   --   FERRITIN  --   --   --  82  --   --   TIBC  --   --   --  393  --   --   IRON  --   --   --  45  --   --     HLD Continue rosuvastatin  and omega-3 fatty acids   Gout Continue allopurinol    OSA Continue CPAP at night  Coccyx pressure ulcer present on admission stage II : ontinue offloading wound care Pressure Injury 04/12/24 Coccyx Stage 2 -  Partial thickness loss of dermis presenting as a shallow open injury with a red, pink wound bed without slough. (Active)  04/12/24 2230  Location: Coccyx  Location Orientation:   Staging: Stage 2 -  Partial thickness loss of dermis presenting as a shallow open injury with a red, pink wound bed without slough.  Wound Description (Comments):   Present on Admission: Yes       DVT prophylaxis: apixaban  (ELIQUIS ) tablet 2.5 mg Start: 04/13/24 1000 Place TED hose Start: 04/12/24 1944 Code Status:   Code Status: Limited: Do not attempt resuscitation (DNR) -DNR-LIMITED -Do Not Intubate/DNI  Family Communication: plan of care discussed with patient and his son at bedside. Patient status is: Remains hospitalized because of severity of illness Level of care: Telemetry Medical   Dispo: The patient is from: home alone, sons nearby            Anticipated disposition:  SNF once bed available.  He is medically stable   Objective: Vitals last 24 hrs: Vitals:   04/14/24 1550 04/14/24 1938 04/15/24 0352 04/15/24 0929  BP: 120/66 103/66 133/69 124/76  Pulse: (!) 54 91 (!) 57 90  Resp: 16 18 17 16   Temp: 97.6 F (36.4 C) 98.3 F (36.8 C) 98.3 F (36.8 C) (!) 97.3 F (36.3 C)  TempSrc:  Oral  Oral  SpO2: 97% 97% 100% 98%  Weight:      Height:        Physical Examination: General exam: alert awake, oriented at baseline, older than stated age HEENT:Oral mucosa moist, Ear/Nose WNL grossly Respiratory system: Bilaterally clear BS,no use of accessory muscle Cardiovascular system: S1 & S2 +, No JVD. Gastrointestinal system: Abdomen soft,NT,ND, BS+ Nervous System: Alert, awake, moving all extremities,and following commands. Extremities:  rt arm in sling, LE edema neg,distal peripheral pulses palpable and warm.  Skin: No rashes,no icterus. MSK: Normal muscle bulk,tone, power   Data Reviewed: I have personally reviewed following labs and imaging studies ( see epic result tab) CBC: Recent Labs  Lab 04/12/24 1351 04/12/24 1417 04/13/24 0623 04/14/24 0606 04/15/24 0646  WBC 6.1  --  5.7 5.2 5.9  HGB 9.3* 9.9* 10.0* 10.0* 10.3*  HCT 29.9* 29.0* 31.1* 31.6* 31.7*  MCV 103.1*  --  102.0* 101.3* 100.6*  PLT 141*  --  141* 135* 155   CMP: Recent Labs  Lab 04/12/24 1351 04/12/24 1417 04/13/24 0623 04/14/24 0606 04/15/24 0646  NA 135 135 137 135 135  K 4.4 4.4 4.6 4.5 4.5  CL 98 100 100 101 97*  CO2 23  --  25 26 25   GLUCOSE 248* 245* 149* 154* 161*  BUN 39* 36* 36* 41* 39*  CREATININE 3.28* 3.20* 3.21* 3.15* 3.27*  CALCIUM  9.3  --  9.7 9.3 9.4  MG  --   --  2.6*  --   --   PHOS  --   --  3.8  --   --    GFR: Estimated Creatinine Clearance: 14 mL/min (A) (by C-G formula based on SCr of 3.27 mg/dL (H)). Recent Labs  Lab 04/12/24 1351 04/13/24 0623  AST 21  --   ALT 13  --   ALKPHOS 69  --   BILITOT 0.9  --   PROT 6.8  --   ALBUMIN  3.3* 3.3*   No results for input(s): "LIPASE", "AMYLASE" in the last 168 hours. No results for input(s): "AMMONIA" in the last 168 hours. Coagulation Profile: No results for input(s): "INR", "PROTIME" in the last 168 hours. Unresulted Labs (From admission, onward)     Start     Ordered   04/14/24 0500  Basic metabolic panel with GFR  Daily,   R      04/13/24 0745   04/14/24 0500  CBC  Daily,   R      04/13/24 0745   04/12/24 1814  Urinalysis, w/ Reflex to Culture (Infection Suspected) -Urine, Clean Catch  Once,   URGENT       Question:  Specimen Source  Answer:  Urine, Clean Catch   04/12/24 1814           Antimicrobials/Microbiology: Anti-infectives (From admission, onward)    None         Component Value Date/Time   SDES BLOOD LEFT HAND 06/14/2014 2225   SPECREQUEST BOTTLES DRAWN AEROBIC ONLY 5CC 06/14/2014 2225   CULT  06/14/2014 2225    NO GROWTH 5 DAYS Performed at [facility]   REPTSTATUS 06/23/2014 FINAL 06/14/2014 2225   Medications reviewed:  Scheduled Meds:  acetaminophen   1,000 mg Oral TID   allopurinol   50 mg Oral Q M,W,F   apixaban   2.5 mg Oral BID   aspirin   81 mg Oral Daily   cholecalciferol   1,000 Units Oral QHS   empagliflozin   10 mg Oral Daily   hydrALAZINE   25 mg Oral Daily   isosorbide  mononitrate  90 mg Oral Daily   lidocaine   1 patch Transdermal Q24H   lidocaine   1 patch Transdermal Q24H   magnesium  oxide  400 mg Oral QODAY   metoprolol  succinate  100 mg Oral Daily   omega-3 acid ethyl esters  1 g Oral QHS   polyethylene glycol  17 g Oral Daily   potassium chloride   20 mEq Oral Daily   ranolazine   500 mg Oral BID   rosuvastatin   10 mg Oral Daily   torsemide   20 mg Oral Daily  Continuous Infusions: Lesa Rape, MD [facility] 04/15/2024, 11:10 AM

## 2024-04-15 NOTE — Consult Note (Signed)
   Asked to clarify antiplatelet therapy.  Received the following message from hospital medicine: "Hi [name], I am not sure if he needs full consult but wanted you to look into his antiplatelet/anticoagulation recommendation- he had a stent in March 2025. He reports he does not take Plavix  due to gum bleeding-and he had spoken to Dr. Arlester Ladd but I do not find any documentation. he is on eliquis , he may be taking asa but not sure. are you able to sort this. he is here for fall and waiting for SNF. "  Given recent intervention in 01/2024, he should be on antiplatelet therapy. I would recommend restarting aspirin  81 mg daily. Continue Eliquis  2.5 mg BID. He has a history of GIB - will need to monitor closely for this.    Hazle Lites, MD, [facility], FNLA, FACP  [place]  [facility]  Medical Director of the [facility] Diplomate of the American Board of Clinical Lipidology Attending Cardiologist  Direct Dial: [phone]  Fax: [phone]  Website:  [email or web address]

## 2024-04-15 NOTE — Progress Notes (Signed)
 Physical Therapy Treatment Patient Details Name: [name] MRN: [id number] DOB: 07-07-1932 Today's Date: 04/15/2024   History of Present Illness Pt is 89 y.o. male presented to [facility] on 04/12/24 due to fall at home, on anticoagulation. W/u revealed Rt 7, 8, 10 rib fx's, Rt nondisplaced coracoid process fx, and Rt pleural effusion. on  01/31/24 for hypoglycemia due to taking too much insulin . PMH significant for A-fib on Eliquis , CAD/NSTEMI s/p CABG, CKD 4, anemia, HLD, CAD, PVD, HTN, gout, GERD, OSA on CPAP, mitral regurg s/p MVR, HFrEF and T2DM .    PT Comments  Pt tolerates treatment well, participating in multiple transfers. PT provides verbal cueing and demonstration for facilitation of increased anterior weight shift and trunk lean, with some improvement in transfer quality noted during session. Pt continues to require physical assistance for all functional mobility and is unable to progress to ambulation at this time due to high risk for falls. Patient will benefit from continued inpatient follow up therapy, <3 hours/day.    If plan is discharge home, recommend the following: Two people to help with walking and/or transfers;A lot of help with bathing/dressing/bathroom;Assistance with cooking/housework;Direct supervision/assist for medications management;Help with stairs or ramp for entrance;Assist for transportation   Can travel by private vehicle     No  Equipment Recommendations  Hospital bed;Wheelchair (measurements PT)    Recommendations for Other Services       Precautions / Restrictions Precautions Precautions: Fall (multiple falls in the last few months) Recall of Precautions/Restrictions: Intact Precaution/Restrictions Comments: Rt coracoid process fx Required Braces or Orthoses: Sling Restrictions Weight Bearing Restrictions Per Provider Order: Yes RUE Weight Bearing Per Provider Order: Non weight bearing     Mobility  Bed Mobility Overal bed mobility: Needs  Assistance Bed Mobility: Supine to Sit     Supine to sit: Min assist, HOB elevated     General bed mobility comments: hand hold for assistance for trunk elevation and scooting to edge of bed    Transfers Overall transfer level: Needs assistance Equipment used: 1 person hand held assist Transfers: Sit to/from Stand, Bed to chair/wheelchair/BSC Sit to Stand: Mod assist   Step pivot transfers: Mod assist       General transfer comment: verbal and visual cues to increase anterior weight shift, physical assistance to power up into upright position    Ambulation/Gait                   Stairs             Wheelchair Mobility     Tilt Bed    Modified Rankin (Stroke Patients Only)       Balance Overall balance assessment: Needs assistance Sitting-balance support: No upper extremity supported, Feet supported Sitting balance-Leahy Scale: Fair     Standing balance support: Single extremity supported, Reliant on assistive device for balance Standing balance-Leahy Scale: Poor Standing balance comment: minA with LUE support                            Communication Communication Communication: Impaired Factors Affecting Communication: Hearing impaired  Cognition Arousal: Alert Behavior During Therapy: WFL for tasks assessed/performed   PT - Cognitive impairments: No apparent impairments                         Following commands: Intact      Cueing Cueing Techniques: Verbal cues  Exercises Other Exercises Other  Exercises: PT provides incentive spirometer to improve pulmonary function, education provided on device use, 5-10 times each hour when awake    General Comments General comments (skin integrity, edema, etc.): VSS on RA      Pertinent Vitals/Pain Pain Assessment Pain Assessment: Faces Faces Pain Scale: Hurts even more Pain Location: R ribs Pain Descriptors / Indicators: Grimacing Pain Intervention(s): Monitored  during session    Home Living                          Prior Function            PT Goals (current goals can now be found in the care plan section) Acute Rehab PT Goals Patient Stated Goal: recover, stop falling Progress towards PT goals: Progressing toward goals    Frequency    Min 2X/week      PT Plan      Co-evaluation              AM-PAC PT "6 Clicks" Mobility   Outcome Measure  Help needed turning from your back to your side while in a flat bed without using bedrails?: A Little Help needed moving from lying on your back to sitting on the side of a flat bed without using bedrails?: A Little Help needed moving to and from a bed to a chair (including a wheelchair)?: A Lot Help needed standing up from a chair using your arms (e.g., wheelchair or bedside chair)?: A Lot Help needed to walk in hospital room?: Total Help needed climbing 3-5 steps with a railing? : Total 6 Click Score: 12    End of Session Equipment Utilized During Treatment: Gait belt Activity Tolerance: Patient tolerated treatment well Patient left: in chair;with call bell/phone within reach;with chair alarm set Nurse Communication: Mobility status;Need for lift equipment (STEDY) PT Visit Diagnosis: Other abnormalities of gait and mobility (R26.89);Muscle weakness (generalized) (M62.81);Difficulty in walking, not elsewhere classified (R26.2);Unsteadiness on feet (R26.81);History of falling (Z91.81)     Time: 0981-1914 PT Time Calculation (min) (ACUTE ONLY): 33 min  Charges:    $Therapeutic Activity: 23-37 mins PT General Charges $$ ACUTE PT VISIT: 1 Visit                     Rexie Catena, PT, DPT Acute Rehabilitation Office [phone]    Rexie Catena 04/15/2024, 11:16 AM

## 2024-04-15 NOTE — Telephone Encounter (Signed)
 Patient is currently admitted to the hospital.

## 2024-04-15 NOTE — TOC Progression Note (Addendum)
 Transition of Care [facility]) - Progression Note    Patient Details  Name: [name] MRN: [id number] Date of Birth: 1932/03/28  [facility]) CM/SW Contact  Elspeth Hals, LCSW Phone Number: 04/15/2024, 11:08 AM  Clinical Narrative:   Bed offers provided to pt on medicare choice document.  Pt would like to accept offer at [facility]  Waiting on confirmation that [facility] can receive pt today.    [facility] cannot receive pt until tomorrow.  Medicare payer with inpt order 04/13/24.  Pt and son [name] updated.   Expected Discharge Plan: Skilled Nursing Facility Barriers to Discharge: Continued Medical Work up  Expected Discharge Plan and Services In-house Referral: Clinical Social Work     Living arrangements for the past 2 months: Single Family Home                                       Social Determinants of Health (SDOH) Interventions SDOH Screenings   Food Insecurity: No Food Insecurity (04/13/2024)  Housing: Low Risk  (04/13/2024)  Transportation Needs: No Transportation Needs (04/13/2024)  Utilities: Not At Risk (04/13/2024)  Alcohol Screen: Low Risk  (10/31/2023)  Depression (PHQ2-9): Low Risk  (02/08/2024)  Recent Concern: Depression (PHQ2-9) - Medium Risk (01/09/2024)  Financial Resource Strain: Low Risk  (10/31/2023)  Physical Activity: Inactive (10/31/2023)  Social Connections: Moderately Isolated (04/13/2024)  Stress: No Stress Concern Present (10/31/2023)  Tobacco Use: Medium Risk (03/19/2024)  Health Literacy: Adequate Health Literacy (10/31/2023)    Readmission Risk Interventions     No data to display

## 2024-04-15 NOTE — Telephone Encounter (Signed)
 Noted, awaiting follow up inpatient notes.

## 2024-04-15 NOTE — Plan of Care (Signed)

## 2024-04-16 DIAGNOSIS — K219 Gastro-esophageal reflux disease without esophagitis: Secondary | ICD-10-CM | POA: Diagnosis not present

## 2024-04-16 DIAGNOSIS — H9313 Tinnitus, bilateral: Secondary | ICD-10-CM | POA: Diagnosis not present

## 2024-04-16 DIAGNOSIS — L97513 Non-pressure chronic ulcer of other part of right foot with necrosis of muscle: Secondary | ICD-10-CM | POA: Diagnosis not present

## 2024-04-16 DIAGNOSIS — E1142 Type 2 diabetes mellitus with diabetic polyneuropathy: Secondary | ICD-10-CM | POA: Diagnosis not present

## 2024-04-16 DIAGNOSIS — I5030 Unspecified diastolic (congestive) heart failure: Secondary | ICD-10-CM | POA: Diagnosis not present

## 2024-04-16 DIAGNOSIS — Z66 Do not resuscitate: Secondary | ICD-10-CM | POA: Diagnosis not present

## 2024-04-16 DIAGNOSIS — L97811 Non-pressure chronic ulcer of other part of right lower leg limited to breakdown of skin: Secondary | ICD-10-CM | POA: Diagnosis not present

## 2024-04-16 DIAGNOSIS — S42131D Displaced fracture of coracoid process, right shoulder, subsequent encounter for fracture with routine healing: Secondary | ICD-10-CM | POA: Diagnosis not present

## 2024-04-16 DIAGNOSIS — E11621 Type 2 diabetes mellitus with foot ulcer: Secondary | ICD-10-CM | POA: Diagnosis not present

## 2024-04-16 DIAGNOSIS — E785 Hyperlipidemia, unspecified: Secondary | ICD-10-CM | POA: Diagnosis not present

## 2024-04-16 DIAGNOSIS — L0390 Cellulitis, unspecified: Secondary | ICD-10-CM | POA: Diagnosis not present

## 2024-04-16 DIAGNOSIS — N184 Chronic kidney disease, stage 4 (severe): Secondary | ICD-10-CM | POA: Diagnosis not present

## 2024-04-16 DIAGNOSIS — E119 Type 2 diabetes mellitus without complications: Secondary | ICD-10-CM | POA: Diagnosis not present

## 2024-04-16 DIAGNOSIS — W19XXXA Unspecified fall, initial encounter: Secondary | ICD-10-CM | POA: Diagnosis not present

## 2024-04-16 DIAGNOSIS — L97518 Non-pressure chronic ulcer of other part of right foot with other specified severity: Secondary | ICD-10-CM | POA: Diagnosis not present

## 2024-04-16 DIAGNOSIS — I5022 Chronic systolic (congestive) heart failure: Secondary | ICD-10-CM | POA: Diagnosis not present

## 2024-04-16 DIAGNOSIS — M6281 Muscle weakness (generalized): Secondary | ICD-10-CM | POA: Diagnosis not present

## 2024-04-16 DIAGNOSIS — L97821 Non-pressure chronic ulcer of other part of left lower leg limited to breakdown of skin: Secondary | ICD-10-CM | POA: Diagnosis not present

## 2024-04-16 DIAGNOSIS — M109 Gout, unspecified: Secondary | ICD-10-CM | POA: Diagnosis not present

## 2024-04-16 DIAGNOSIS — J310 Chronic rhinitis: Secondary | ICD-10-CM | POA: Diagnosis not present

## 2024-04-16 DIAGNOSIS — I501 Left ventricular failure: Secondary | ICD-10-CM | POA: Diagnosis not present

## 2024-04-16 DIAGNOSIS — G4730 Sleep apnea, unspecified: Secondary | ICD-10-CM | POA: Diagnosis not present

## 2024-04-16 DIAGNOSIS — L97909 Non-pressure chronic ulcer of unspecified part of unspecified lower leg with unspecified severity: Secondary | ICD-10-CM | POA: Diagnosis not present

## 2024-04-16 DIAGNOSIS — R234 Changes in skin texture: Secondary | ICD-10-CM | POA: Diagnosis not present

## 2024-04-16 DIAGNOSIS — I10 Essential (primary) hypertension: Secondary | ICD-10-CM | POA: Diagnosis not present

## 2024-04-16 DIAGNOSIS — E789 Disorder of lipoprotein metabolism, unspecified: Secondary | ICD-10-CM | POA: Diagnosis not present

## 2024-04-16 DIAGNOSIS — N179 Acute kidney failure, unspecified: Secondary | ICD-10-CM | POA: Diagnosis not present

## 2024-04-16 DIAGNOSIS — Z955 Presence of coronary angioplasty implant and graft: Secondary | ICD-10-CM | POA: Diagnosis not present

## 2024-04-16 DIAGNOSIS — R278 Other lack of coordination: Secondary | ICD-10-CM | POA: Diagnosis not present

## 2024-04-16 DIAGNOSIS — R2689 Other abnormalities of gait and mobility: Secondary | ICD-10-CM | POA: Diagnosis not present

## 2024-04-16 DIAGNOSIS — I482 Chronic atrial fibrillation, unspecified: Secondary | ICD-10-CM | POA: Diagnosis not present

## 2024-04-16 DIAGNOSIS — D649 Anemia, unspecified: Secondary | ICD-10-CM | POA: Diagnosis not present

## 2024-04-16 DIAGNOSIS — R531 Weakness: Secondary | ICD-10-CM | POA: Diagnosis not present

## 2024-04-16 DIAGNOSIS — L97111 Non-pressure chronic ulcer of right thigh limited to breakdown of skin: Secondary | ICD-10-CM | POA: Diagnosis not present

## 2024-04-16 DIAGNOSIS — I5023 Acute on chronic systolic (congestive) heart failure: Secondary | ICD-10-CM | POA: Diagnosis not present

## 2024-04-16 DIAGNOSIS — I87311 Chronic venous hypertension (idiopathic) with ulcer of right lower extremity: Secondary | ICD-10-CM | POA: Diagnosis not present

## 2024-04-16 DIAGNOSIS — I739 Peripheral vascular disease, unspecified: Secondary | ICD-10-CM | POA: Diagnosis not present

## 2024-04-16 DIAGNOSIS — H903 Sensorineural hearing loss, bilateral: Secondary | ICD-10-CM | POA: Diagnosis not present

## 2024-04-16 DIAGNOSIS — Z743 Need for continuous supervision: Secondary | ICD-10-CM | POA: Diagnosis not present

## 2024-04-16 DIAGNOSIS — S2241XD Multiple fractures of ribs, right side, subsequent encounter for fracture with routine healing: Secondary | ICD-10-CM | POA: Diagnosis not present

## 2024-04-16 DIAGNOSIS — I87313 Chronic venous hypertension (idiopathic) with ulcer of bilateral lower extremity: Secondary | ICD-10-CM | POA: Diagnosis not present

## 2024-04-16 DIAGNOSIS — M25871 Other specified joint disorders, right ankle and foot: Secondary | ICD-10-CM | POA: Diagnosis not present

## 2024-04-16 DIAGNOSIS — I4821 Permanent atrial fibrillation: Secondary | ICD-10-CM | POA: Diagnosis not present

## 2024-04-16 DIAGNOSIS — H906 Mixed conductive and sensorineural hearing loss, bilateral: Secondary | ICD-10-CM | POA: Diagnosis not present

## 2024-04-16 DIAGNOSIS — M25511 Pain in right shoulder: Secondary | ICD-10-CM | POA: Diagnosis not present

## 2024-04-16 DIAGNOSIS — I272 Pulmonary hypertension, unspecified: Secondary | ICD-10-CM | POA: Diagnosis not present

## 2024-04-16 DIAGNOSIS — L509 Urticaria, unspecified: Secondary | ICD-10-CM | POA: Diagnosis not present

## 2024-04-16 LAB — CBC
HCT: 31.2 % — ABNORMAL LOW (ref 39.0–52.0)
Hemoglobin: 9.9 g/dL — ABNORMAL LOW (ref 13.0–17.0)
MCH: 32.2 pg (ref 26.0–34.0)
MCHC: 31.7 g/dL (ref 30.0–36.0)
MCV: 101.6 fL — ABNORMAL HIGH (ref 80.0–100.0)
Platelets: 159 10*3/uL (ref 150–400)
RBC: 3.07 MIL/uL — ABNORMAL LOW (ref 4.22–5.81)
RDW: 16.1 % — ABNORMAL HIGH (ref 11.5–15.5)
WBC: 5.9 10*3/uL (ref 4.0–10.5)
nRBC: 0 % (ref 0.0–0.2)

## 2024-04-16 LAB — BASIC METABOLIC PANEL WITH GFR
Anion gap: 6 (ref 5–15)
BUN: 35 mg/dL — ABNORMAL HIGH (ref 8–23)
CO2: 27 mmol/L (ref 22–32)
Calcium: 9 mg/dL (ref 8.9–10.3)
Chloride: 101 mmol/L (ref 98–111)
Creatinine, Ser: 3.21 mg/dL — ABNORMAL HIGH (ref 0.61–1.24)
GFR, Estimated: 18 mL/min — ABNORMAL LOW (ref >60)
Glucose, Bld: 152 mg/dL — ABNORMAL HIGH (ref 70–99)
Potassium: 4.1 mmol/L (ref 3.5–5.1)
Sodium: 134 mmol/L — ABNORMAL LOW (ref 135–145)

## 2024-04-16 NOTE — Progress Notes (Signed)
 Heart Failure Navigator Progress Note  Assessed for Heart & Vascular TOC clinic readiness.  Patient has a scheduled [facility] appointment on 05/13/2024. No HF TOC. .   Navigator will sign off at this time.   Randie Bustle, [name], Scientist, clinical (histocompatibility and immunogenetics) Only

## 2024-04-16 NOTE — Care Management Important Message (Signed)
 Important Message  Patient Details  Name: [name] MRN: [id number] Date of Birth: 12-23-1931   Important Message Given:  Yes - Medicare IM     Felix Host 04/16/2024, 1:33 PM

## 2024-04-16 NOTE — TOC Transition Note (Signed)
 Transition of Care [facility]) - Discharge Note   Patient Details  Name: [name] MRN: [id number] Date of Birth: [birth year]  [facility] CM/SW Contact:  Elspeth Hals, LCSW Phone Number: 04/16/2024, 11:15 AM   Clinical Narrative:   Pt discharging to [facility], room 503B.  RN call report to [phone].  PTAR called [phone].    0830: CSW confirmed with [facility] that they can receive pt today.   Final next level of care: Skilled Nursing Facility Barriers to Discharge: Barriers Resolved   Patient Goals and CMS Choice Patient states their goals for this hospitalization and ongoing recovery are:: SNF   Choice offered to / list presented to : Patient      Discharge Placement              Patient chooses bed at: [facility] Patient to be transferred to facility by: ptar Name of family member notified: son [name] in room Patient and family notified of of transfer: 04/16/24  Discharge Plan and Services Additional resources added to the After Visit Summary for   In-house Referral: Clinical Social Work                                   Social Drivers of Health (SDOH) Interventions SDOH Screenings   Food Insecurity: No Food Insecurity (04/13/2024)  Housing: Low Risk  (04/13/2024)  Transportation Needs: No Transportation Needs (04/13/2024)  Utilities: Not At Risk (04/13/2024)  Alcohol Screen: Low Risk  (10/31/2023)  Depression (PHQ2-9): Low Risk  (02/08/2024)  Recent Concern: Depression (PHQ2-9) - Medium Risk (01/09/2024)  Financial Resource Strain: Low Risk  (10/31/2023)  Physical Activity: Inactive (10/31/2023)  Social Connections: Moderately Isolated (04/13/2024)  Stress: No Stress Concern Present (10/31/2023)  Tobacco Use: Medium Risk (03/19/2024)  Health Literacy: Adequate Health Literacy (10/31/2023)     Readmission Risk Interventions     No data to display

## 2024-04-16 NOTE — Plan of Care (Signed)

## 2024-04-16 NOTE — Progress Notes (Signed)
 Attempted to call report. Got nurse Counsellor. Left callback number.

## 2024-04-16 NOTE — Discharge Summary (Signed)
 Physician Discharge Summary  [name] XLK:[id number] DOB: Mar 02, 1932 DOA: 04/12/2024  PCP: Donnie Galea, MD  Admit date: 04/12/2024 Discharge date: 04/16/2024 Recommendations for Outpatient Follow-up:  Follow up with PCP in 1 weeks-call for appointment Please obtain BMP/CBC in one week  Discharge Dispo: SNF Discharge Condition: Stable Code Status:   Code Status: Limited: Do not attempt resuscitation (DNR) -DNR-LIMITED -Do Not Intubate/DNI  Diet recommendation:  Diet Order             Diet - low sodium heart healthy           Diet heart healthy/carb modified Room service appropriate? Yes; Fluid consistency: Thin  Diet effective now                    Brief/Interim Summary: 89 y.o. male with medical history significant for A-fib on Eliquis , CAD/NSTEMI s/p CABG, CKD 4, anemia, HLD,Carotid art dis, PVD  HTN, gout, GERD, OSA on CPAP, mitral regurg s/p MVR, HFrEF and T2DM who presents to the ED valuation after a fall while making oatmeal in the kitchen landing on the right shoulder, no loss of consciousness. In the ED hemodynamically stable,blood glucose 248, BUN/creatinine 39/3.28, WBC 6.1, Hgb 9.3, platelet 141.  EKG shows A-fib with a rate of 65 and RBBB.  Trauma scan reveals nondisplaced fracture to the right coracoid process with nondisplaced fractures of the posterior right 10th rib and anterior right 7th and 8th ribs with moderate volume right pleural effusion, A right shoulder immobilizer/sling was placed.  Trauma surgery was consulted and admitted. Managing with sling for his right coracoid process fracture, pain control on his rib fracture with multimodal management. Weak and deconditioned and recommending skilled nursing facility.  Waiting for placement Overall patient remains hemodynamically stable waiting for placement  Subjective: Alert and awake Eager to go to snf today No complaints Overnight afebrile BP stable labs creatinine about the same 3.2 hemoglobin  9.9 No new changes  Discharge Diagnoses:  Mechanical fall at home Nondisplaced fracture of right coracoid process Right rib fractures 7, 8, 10 Chronic looking right pleural effusion: Trauma surgery input appreciated. Patient pain controlled on RA. Continue with multimodal pain control pulmonary toileting PT OT Cont sling Rt arm as per [facility] as OP Per trauma resumed DOAC 04/13/24 doing ok.   Acute on chronic systolic heart failure Pleural effusions chronic appearing: Presenting with lower extremity swelling and fatigue with ambulation but no significant shortness of breath,BNP elevated in 3000  CXR with moderate R pleural effusion managed with IV Lasix , home metoprolol  Overall volume status improved, now on PO home  torsemide   Cont to monitor daily Net IO Since Admission: -2,260 mL [04/16/24 0940]  Filed Weights   04/12/24 1351 04/12/24 1354  Weight: 72.1 kg 72.1 kg    Recent Labs  Lab 04/12/24 1417 04/12/24 1814 04/13/24 0623 04/14/24 0606 04/15/24 0646 04/16/24 0619  BNP  --  2,960.9*  --   --   --   --   BUN 36*  --  36* 41* 39* 35*  CREATININE 3.20*  --  3.21* 3.15* 3.27* 3.21*  K 4.4  --  4.6 4.5 4.5 4.1  MG  --   --  2.6*  --   --   --    PVD Patient with cool extremities and nonpalpable but dopplerable pedal pulses W/ small superficial ulcer on the right big toe Endorsed weakness in lower extremity with ambulation but no pain. On statins eliquis  and statins. ABI  completed right with mild right lower extremity arterial disease left also mild, unable to obtain TBI due to grade 2 ulceration.  AKI on CKD vs ckd-progressive CKD: creatinine has been  in low 3s. Patient tolerating diuresis continue torsemide  monitor renal function as outpatient follow-up with PCP/nephrology He is likely at new baseline  Persistent A-fib: HR stable, Continue Toprol -XL and resuming Eliqui  T2DM: Last  A1c 6.4% 2 months ago.  Continue sliding scale insulin , Jardiance  Recent Labs   Lab 04/12/24 2234  GLUCAP 173*    CAD/NSTEMI s/p CABG Recent stent of diagonal branch vein graft. in 02/02/2024 No chest pain cont home rosuvastatin , Ranexa  and Imdur . He was recommended to continue aspirin  81 mg x 30 days then stop, recommended clopidogrel  75 mg daily x 6 months as tolerated. As per patient he no longer takes plavix  due to gum bleeding and significant blood clot in mouth on waking up in morning that he had to bite th cot to get it out-and he had spoken to Dr Arlester Ladd and was ok to stay off since its been >40 days since his cath per patient. He refused plavix  that was resumed 5/31> discussed with cardiology at this time he needs to at least continue aspirin  continue Eliquis .  Follow-up with cardiology as outpatient Per pharmacy Ranexa  has been discontinued due to CKD-primary cardiology notified   HTN: BP initially soft on admission.controlled on hydralazine , Toprol  XL, Ranexa  and Imdur    Macrocytic anemia Anemia of CKD Thrombocytopenia Stable hb w/ baseline ~ 9-10.Vitamin B12 of 464 2 months ago.iron studies ok Recent Labs    02/08/24 1300 04/12/24 1351 04/12/24 1417 04/13/24 0623 04/14/24 0606 04/15/24 0646 04/16/24 0619  HGB 10.1*   < > 9.9* 10.0* 10.0* 10.3* 9.9*  MCV 96.4   < >  --  102.0* 101.3* 100.6* 101.6*  VITAMINB12 464  --   --   --   --   --   --   FERRITIN  --   --   --  82  --   --   --   TIBC  --   --   --  393  --   --   --   IRON  --   --   --  45  --   --   --    < > = values in this interval not displayed.    HLD Continue rosuvastatin  and omega-3 fatty acids   Gout Continue allopurinol    OSA Continue CPAP at night  Coccyx pressure ulcer present on admission stage II : ontinue offloading wound care Pressure Injury 04/12/24 Coccyx Stage 2 -  Partial thickness loss of dermis presenting as a shallow open injury with a red, pink wound bed without slough. (Active)  04/12/24 2230  Location: Coccyx  Location Orientation:   Staging: Stage 2 -   Partial thickness loss of dermis presenting as a shallow open injury with a red, pink wound bed without slough.  Wound Description (Comments):   Present on Admission: Yes    Discharge Exam: Vitals:   04/16/24 0404 04/16/24 0847  BP: 139/69 (!) 147/62  Pulse: (!) 50 72  Resp: 18 17  Temp: 98.2 F (36.8 C)   SpO2: 98% 98%   General: Pt is alert, awake, not in acute distress Cardiovascular: RRR, S1/S2 +, no rubs, no gallops Respiratory: CTA bilaterally, no wheezing, no rhonchi Abdominal: Soft, NT, ND, bowel sounds + Extremities: no edema, no cyanosis  Discharge Instructions  Discharge Instructions     (  HEART FAILURE PATIENTS) Call MD:  Anytime you have any of the following symptoms: 1) 3 pound weight gain in 24 hours or 5 pounds in 1 week 2) shortness of breath, with or without a dry hacking cough 3) swelling in the hands, feet or stomach 4) if you have to sleep on extra pillows at night in order to breathe.   Complete by: As directed    Diet - low sodium heart healthy   Complete by: As directed    Discharge instructions   Complete by: As directed    Please call call MD or return to ER for similar or worsening recurring problem that brought you to hospital or if any fever,nausea/vomiting,abdominal pain, uncontrolled pain, chest pain,  shortness of breath or any other alarming symptoms.  Please follow-up your doctor as instructed in a week time and call the office for appointment.  Please avoid alcohol, smoking, or any other illicit substance and maintain healthy habits including taking your regular medications as prescribed.  You were cared for by a hospitalist during your hospital stay. If you have any questions about your discharge medications or the care you received while you were in the hospital after you are discharged, you can call the unit and ask to speak with the hospitalist on call if the hospitalist that took care of you is not available.  Once you are discharged, your  primary care physician will handle any further medical issues. Please note that NO REFILLS for any discharge medications will be authorized once you are discharged, as it is imperative that you return to your primary care physician (or establish a relationship with a primary care physician if you do not have one) for your aftercare needs so that they can reassess your need for medications and monitor your lab values   Discharge wound care:   Complete by: As directed    Offloading and wound care on the coccyx      Allergies as of 04/16/2024       Reactions   Brilinta  [ticagrelor ] Other (See Comments)   bleeding   Nsaids Other (See Comments)   CKD        Medication List     STOP taking these medications    clopidogrel  75 MG tablet Commonly known as: PLAVIX    ranolazine  500 MG 12 hr tablet Commonly known as: RANEXA        TAKE these medications    acetaminophen  325 MG tablet Commonly known as: TYLENOL  Take 2 tablets (650 mg total) by mouth every 6 (six) hours as needed for mild pain (pain score 1-3) (or Fever >/= 101). What changed: reasons to take this   allopurinol  100 MG tablet Commonly known as: ZYLOPRIM  Take 0.5 tablets (50 mg total) by mouth every Monday, Wednesday, and Friday.   aspirin  81 MG chewable tablet Chew 1 tablet (81 mg total) by mouth daily.   Eliquis  2.5 MG Tabs tablet Generic drug: apixaban  TAKE 1 TABLET TWICE A DAY   fish oil-omega-3 fatty acids  1000 MG capsule Take 1 g by mouth at bedtime.   FREESTYLE LITE test strip Generic drug: glucose blood USE TO TEST BLOOD SUGAR ONCE DAILY AND AS NEEDED   hydrALAZINE  25 MG tablet Commonly known as: APRESOLINE  Take 1 tablet (25 mg total) by mouth daily.   ipratropium 0.03 % nasal spray Commonly known as: ATROVENT  USE 2 SPRAYS IN EACH NOSTRIL TWICE A DAY AS NEEDED FOR RHINITIS   isosorbide  mononitrate 60 MG 24 hr tablet Commonly  known as: IMDUR  Take 1.5 tablets (90 mg total) by mouth daily.    Jardiance  10 MG Tabs tablet Generic drug: empagliflozin  Take 10 mg by mouth daily.   lidocaine  5 % Commonly known as: LIDODERM  Place 1 patch onto the skin daily. Remove & Discard patch within 12 hours or as directed by MD   magnesium  oxide 400 MG tablet Commonly known as: MAG-OX Take 1 tablet (400 mg total) by mouth every other day.   metoprolol  succinate 100 MG 24 hr tablet Commonly known as: TOPROL -XL Take 1 tablet (100 mg total) by mouth daily. Take with or immediately following a meal.   nitroGLYCERIN  0.4 MG SL tablet Commonly known as: NITROSTAT  DISSOLVE 1 TABLET UNDER THE TONGUE EVERY 5 MINUTES AS NEEDED FOR CHEST PAIN FOR A MAXIMUM OF 3 DOSES   polyethylene glycol 17 g packet Commonly known as: MIRALAX  / GLYCOLAX  Take 17 g by mouth daily.   Potassium Gluconate 595 MG Caps Take 595 mg by mouth in the morning.   rosuvastatin  10 MG tablet Commonly known as: CRESTOR  TAKE 1 TABLET DAILY   torsemide  20 MG tablet Commonly known as: DEMADEX  Take 1 tab daily in the AM if weight is increasing. What changed:  how much to take how to take this when to take this   Vitamin D  1000 units capsule Take 1,000 Units by mouth at bedtime.               Discharge Care Instructions  (From admission, onward)           Start     Ordered   04/15/24 0000  Discharge wound care:       Comments: Offloading and wound care on the coccyx   04/15/24 1110            Follow-up Information     Donnie Galea, MD Follow up in 1 week(s).   Specialty: Family Medicine Contact information: [address] [place] [address] [phone]                Allergies  Allergen Reactions   Brilinta  [Ticagrelor ] Other (See Comments)    bleeding   Nsaids Other (See Comments)    CKD    The results of significant diagnostics from this hospitalization (including imaging, microbiology, ancillary and laboratory) are listed below for reference.     Microbiology: No results found for this or any previous visit (from the past 240 hours).  Procedures/Studies: VAS US  ABI WITH/WO TBI Result Date: 04/13/2024  LOWER EXTREMITY DOPPLER STUDY Patient Name:  [name]  Date of Exam:   04/13/2024 Medical Rec #: [id number]       Accession #:    [id number] Date of Birth: 1932-07-06       Patient Gender: M Patient Age:   89 years Exam Location:  [facility] Procedure:      VAS US  ABI WITH/WO TBI Referring Phys: PROSPER AMPONSAH --------------------------------------------------------------------------------  Indications: Peripheral artery disease. High Risk         Hypertension, hyperlipidemia, Diabetes, coronary artery Factors:          disease.  Limitations: Today's exam was limited due to Restricted right arm, bandages and              an open wound. Comparison Study: No prior studies. Performing Technologist: Birda Buffy RVT  Examination Guidelines: A complete evaluation includes at minimum, Doppler waveform signals and systolic blood pressure reading at the level of bilateral  brachial, anterior tibial, and posterior tibial arteries, when vessel segments are accessible. Bilateral testing is considered an integral part of a complete examination. Photoelectric Plethysmograph (PPG) waveforms and toe systolic pressure readings are included as required and additional duplex testing as needed. Limited examinations for reoccurring indications may be performed as noted.  ABI Findings: +--------+------------------+-----+----------+----------+ Right   Rt Pressure (mmHg)IndexWaveform  Comment    +--------+------------------+-----+----------+----------+ Brachial                                 Restricted +--------+------------------+-----+----------+----------+ PTA     45                0.30 monophasic           +--------+------------------+-----+----------+----------+ DP      127               0.84 monophasic            +--------+------------------+-----+----------+----------+ +--------+------------------+-----+-----------+-------+ Left    Lt Pressure (mmHg)IndexWaveform   Comment +--------+------------------+-----+-----------+-------+ [id number]                    triphasic          +--------+------------------+-----+-----------+-------+ PTA     122               0.81 monophasic         +--------+------------------+-----+-----------+-------+ DP      117               0.77 multiphasic        +--------+------------------+-----+-----------+-------+ +-------+-----------+-----------+------------+------------+ ABI/TBIToday's ABIToday's TBIPrevious ABIPrevious TBI +-------+-----------+-----------+------------+------------+ Right  0.84                                           +-------+-----------+-----------+------------+------------+ Left   0.81                                           +-------+-----------+-----------+------------+------------+  Summary: Right: Resting right ankle-brachial index indicates mild right lower extremity arterial disease. Unable to obtain TBI due to great toe ulceration. Left: Resting left ankle-brachial index indicates mild left lower extremity arterial disease. Unable to obtain TBI due to great toe ulceration. *See table(s) above for measurements and observations.  Electronically signed by Jimmye Moulds MD on 04/13/2024 at 3:31:32 PM.    Final    CT CHEST ABDOMEN PELVIS W CONTRAST Result Date: 04/12/2024 CLINICAL DATA:  Polytrauma, blunt, fall, abdominal pain EXAM: CT CHEST, ABDOMEN, AND PELVIS WITH CONTRAST TECHNIQUE: Multidetector CT imaging of the chest, abdomen and pelvis was performed following the standard protocol during bolus administration of intravenous contrast. RADIATION DOSE REDUCTION: This exam was performed according to the departmental dose-optimization program which includes automated exposure control, adjustment of the mA and/or kV  according to patient size and/or use of iterative reconstruction technique. CONTRAST:  60mL OMNIPAQUE  IOHEXOL  350 MG/ML SOLN COMPARISON:  04/12/2024, 08/18/2023 FINDINGS: CT CHEST FINDINGS Pulmonary Embolism: No pulmonary embolism. Cardiovascular: Cardiomegaly with a biatrial predominance. No pericardial effusion.No aortic aneurysm. Dense multi-vessel coronary atherosclerosis with changes of a prior CABG procedure. Diffusely calcified aorta. Mediastinum/Nodes: No mediastinal mass.No mediastinal, hilar, or axillary lymphadenopathy. Lungs/Pleura: The midline trachea and bronchi are patent. Moderate volume right pleural effusion with compressive atelectasis  in the upper and lower lobes. Trace left pleural effusion. No pneumothorax. 5 mm nodule in the left upper lobe, likely infectious or inflammatory. CT ABDOMEN PELVIS FINDINGS Hepatobiliary: No mass.Cholecystectomy. Mild dilation of the common bile duct, likely related to the prior cholecystectomy. Pancreas: No mass or main ductal dilation.No peripancreatic inflammation or fluid collection. Spleen: Normal size. No mass. Adrenals/Urinary Tract: No adrenal masses. No renal mass. No hydronephrosis or nephrolithiasis. Circumferential wall thickening of the urinary bladder. Stomach/Bowel: The stomach contains ingested material without focal abnormality. No small bowel wall thickening or inflammation. No small bowel obstruction.Normal appendix.Descending colonic diverticulosis. Sigmoid anastomosis. Vascular/Lymphatic: No aortic aneurysm. Diffuse aortoiliac atherosclerosis. no intraabdominal or pelvic lymphadenopathy. Reproductive: No prostatomegaly. Small volume free pelvic fluid. Mild presacral edema. Other: No pneumoperitoneum. Musculoskeletal: Osteopenia. Nondisplaced fracture through the right coracoid process. Nondisplaced fracture of the posterolateral right tenth rib. Mild buckle type fractures of the anterior seventh and eighth ribs. Remote, healing fractures of the  lateral right ninth and left tenth ribs. Sternotomy wires. Multilevel degenerative disc disease of the spine. Mild grade 1 anterolisthesis of L4 on L5. Diffuse anasarca. IMPRESSION: 1. Nondisplaced fracture through the right coracoid process with nondisplaced fractures of the posterior right tenth rib with buckle type fractures of the anterior right seventh and eighth ribs. 2. Moderate volume right pleural effusion with compressive atelectasis. Trace left pleural effusion. 3. Diffuse anasarca. Small volume free fluid in the pelvis with presacral edema. In the setting of cardiomegaly, these findings are likely related to patient's volume status. 4. Circumferential wall thickening of the urinary bladder, which may be due to underdistension. If there is concern for acute cystitis, correlation with urinalysis would be recommended. 5. Descending colonic diverticulosis. No changes of acute diverticulitis. Aortic Atherosclerosis (ICD10-I70.0). Electronically Signed   By: Rance Burrows M.D.   On: 04/12/2024 17:47   DG Pelvis Portable Result Date: 04/12/2024 CLINICAL DATA:  Status post fall today with right-sided pain. EXAM: DG HIP (WITH OR WITHOUT PELVIS) 2-3V RIGHT; PORTABLE PELVIS 1-2 VIEWS COMPARISON:  None Available. FINDINGS: Pelvis: The cortical margins of the bony pelvis are intact. No fracture. Pubic symphysis and sacroiliac joints are congruent. Both femoral heads are well-seated in the respective acetabula. Right hip: No acute fracture. Femoral head is well seated, no dislocation. Mild acetabular spurring. Peripheral vascular calcifications are seen. Mild generalized soft tissue edema. IMPRESSION: 1. No fracture of the pelvis or right hip. 2. Mild degenerative change of the right hip. Electronically Signed   By: Chadwick Colonel M.D.   On: 04/12/2024 16:47   DG Hip Unilat W or Wo Pelvis 2-3 Views Right Result Date: 04/12/2024 CLINICAL DATA:  Status post fall today with right-sided pain. EXAM: DG HIP (WITH  OR WITHOUT PELVIS) 2-3V RIGHT; PORTABLE PELVIS 1-2 VIEWS COMPARISON:  None Available. FINDINGS: Pelvis: The cortical margins of the bony pelvis are intact. No fracture. Pubic symphysis and sacroiliac joints are congruent. Both femoral heads are well-seated in the respective acetabula. Right hip: No acute fracture. Femoral head is well seated, no dislocation. Mild acetabular spurring. Peripheral vascular calcifications are seen. Mild generalized soft tissue edema. IMPRESSION: 1. No fracture of the pelvis or right hip. 2. Mild degenerative change of the right hip. Electronically Signed   By: Chadwick Colonel M.D.   On: 04/12/2024 16:47   DG Chest Portable 1 View Result Date: 04/12/2024 CLINICAL DATA:  Fall today on right side.  Right-sided pain. EXAM: PORTABLE CHEST 1 VIEW COMPARISON:  Radiograph 01/31/2024 FINDINGS: Lung volumes are low. Cardiomegaly  is stable, post median sternotomy. Clip projecting over the lower mediastinum likely related to prior mitral valve repair. Small to moderate right pleural effusion has increased from prior exam. No convincing pneumothorax. Skin folds project over both lower lateral chest wall. No grossly displaced rib fracture. IMPRESSION: 1. Small to moderate right pleural effusion, increased from prior exam. 2. Low lung volumes. 3. Stable cardiomegaly. Electronically Signed   By: Chadwick Colonel M.D.   On: 04/12/2024 16:46   CT HEAD WO CONTRAST ( ) Result Date: 04/12/2024 CLINICAL DATA:  Head trauma, moderate-severe; Neck trauma (Age >= 65y). Fall from standing. EXAM: CT HEAD WITHOUT CONTRAST CT CERVICAL SPINE WITHOUT CONTRAST TECHNIQUE: Multidetector CT imaging of the head and cervical spine was performed following the standard protocol without intravenous contrast. Multiplanar CT image reconstructions of the cervical spine were also generated. RADIATION DOSE REDUCTION: This exam was performed according to the departmental dose-optimization program which includes automated  exposure control, adjustment of the mA and/or kV according to patient size and/or use of iterative reconstruction technique. COMPARISON:  CT head and cervical spine 11/18/2021 FINDINGS: CT HEAD FINDINGS Brain: There is no evidence of an acute infarct, intracranial hemorrhage, mass, midline shift, or extra-axial fluid collection. Generalized cerebral atrophy is mild for age. Cerebral white matter hypodensities are unchanged and nonspecific but compatible with mild chronic small vessel ischemic disease. Vascular: Calcified atherosclerosis at the skull base. No hyperdense vessel. Skull: No acute fracture or suspicious lesion. Sinuses/Orbits: Minimal mucosal thickening in the included portions of the paranasal sinuses. Clear mastoid air cells. Bilateral cataract extraction. Other: None. CT CERVICAL SPINE FINDINGS Alignment: Chronic reversal of the normal cervical lordosis. Unchanged trace anterolisthesis of C7 on T1 and trace retrolisthesis of C5 on C6. Skull base and vertebrae: No acute fracture or suspicious lesion. Soft tissues and spinal canal: No prevertebral fluid or swelling. No visible canal hematoma. Disc levels: Advanced disc degeneration from C3-4 through T1-2 and moderate multilevel facet arthrosis. Moderate to severe multilevel neural foraminal stenosis. No evidence of high-grade spinal canal stenosis. Upper chest: More fully evaluated on the separately reported contemporaneous CT of the chest, abdomen, and pelvis. Other: Prominent atherosclerotic calcification at the carotid bifurcations. IMPRESSION: No evidence of acute intracranial abnormality or cervical spine fracture. Electronically Signed   By: Aundra Lee M.D.   On: 04/12/2024 16:25   CT Cervical Spine Wo Contrast Result Date: 04/12/2024 CLINICAL DATA:  Head trauma, moderate-severe; Neck trauma (Age >= 65y). Fall from standing. EXAM: CT HEAD WITHOUT CONTRAST CT CERVICAL SPINE WITHOUT CONTRAST TECHNIQUE: Multidetector CT imaging of the head and  cervical spine was performed following the standard protocol without intravenous contrast. Multiplanar CT image reconstructions of the cervical spine were also generated. RADIATION DOSE REDUCTION: This exam was performed according to the departmental dose-optimization program which includes automated exposure control, adjustment of the mA and/or kV according to patient size and/or use of iterative reconstruction technique. COMPARISON:  CT head and cervical spine 11/18/2021 FINDINGS: CT HEAD FINDINGS Brain: There is no evidence of an acute infarct, intracranial hemorrhage, mass, midline shift, or extra-axial fluid collection. Generalized cerebral atrophy is mild for age. Cerebral white matter hypodensities are unchanged and nonspecific but compatible with mild chronic small vessel ischemic disease. Vascular: Calcified atherosclerosis at the skull base. No hyperdense vessel. Skull: No acute fracture or suspicious lesion. Sinuses/Orbits: Minimal mucosal thickening in the included portions of the paranasal sinuses. Clear mastoid air cells. Bilateral cataract extraction. Other: None. CT CERVICAL SPINE FINDINGS Alignment: Chronic reversal of the normal cervical  lordosis. Unchanged trace anterolisthesis of C7 on T1 and trace retrolisthesis of C5 on C6. Skull base and vertebrae: No acute fracture or suspicious lesion. Soft tissues and spinal canal: No prevertebral fluid or swelling. No visible canal hematoma. Disc levels: Advanced disc degeneration from C3-4 through T1-2 and moderate multilevel facet arthrosis. Moderate to severe multilevel neural foraminal stenosis. No evidence of high-grade spinal canal stenosis. Upper chest: More fully evaluated on the separately reported contemporaneous CT of the chest, abdomen, and pelvis. Other: Prominent atherosclerotic calcification at the carotid bifurcations. IMPRESSION: No evidence of acute intracranial abnormality or cervical spine fracture. Electronically Signed   By: Aundra Lee M.D.   On: 04/12/2024 16:25    Labs: BNP (last 3 results) Recent Labs    01/31/24 0950 04/12/24 1814  BNP 995.3* 2,960.9*   Basic Metabolic Panel: Recent Labs  Lab 04/12/24 1351 04/12/24 1417 04/13/24 0623 04/14/24 0606 04/15/24 0646 04/16/24 0619  NA 135 135 137 135 135 134*  K 4.4 4.4 4.6 4.5 4.5 4.1  CL 98 100 100 101 97* 101  CO2 23  --  25 26 25 27   GLUCOSE 248* 245* 149* 154* 161* 152*  BUN 39* 36* 36* 41* 39* 35*  CREATININE 3.28* 3.20* 3.21* 3.15* 3.27* 3.21*  CALCIUM  9.3  --  9.7 9.3 9.4 9.0  MG  --   --  2.6*  --   --   --   PHOS  --   --  3.8  --   --   --    Liver Function Tests: Recent Labs  Lab 04/12/24 1351 04/13/24 0623  AST 21  --   ALT 13  --   ALKPHOS 69  --   BILITOT 0.9  --   PROT 6.8  --   ALBUMIN 3.3* 3.3*   No results for input(s): "LIPASE", "AMYLASE" in the last 168 hours. No results for input(s): "AMMONIA" in the last 168 hours. CBC: Recent Labs  Lab 04/12/24 1351 04/12/24 1417 04/13/24 0623 04/14/24 0606 04/15/24 0646 04/16/24 0619  WBC 6.1  --  5.7 5.2 5.9 5.9  HGB 9.3* 9.9* 10.0* 10.0* 10.3* 9.9*  HCT 29.9* 29.0* 31.1* 31.6* 31.7* 31.2*  MCV 103.1*  --  102.0* 101.3* 100.6* 101.6*  PLT 141*  --  141* 135* 155 159  No results for input(s): "VITAMINB12", "FOLATE", "FERRITIN", "TIBC", "IRON", "RETICCTPCT" in the last 72 hours. Urinalysis    Component Value Date/Time   COLORURINE YELLOW 12/06/2022 1330   APPEARANCEUR CLEAR 12/06/2022 1330   LABSPEC 1.011 12/06/2022 1330   PHURINE 7.0 12/06/2022 1330   GLUCOSEU >=500 (A) 12/06/2022 1330   HGBUR NEGATIVE 12/06/2022 1330   HGBUR negative 09/12/2007 1028   BILIRUBINUR NEGATIVE 12/06/2022 1330   KETONESUR 5 (A) 12/06/2022 1330   PROTEINUR NEGATIVE 12/06/2022 1330   UROBILINOGEN 0.2 07/20/2020 1144   NITRITE NEGATIVE 12/06/2022 1330   LEUKOCYTESUR NEGATIVE 12/06/2022 1330   Sepsis Labs Recent Labs  Lab 04/13/24 0623 04/14/24 0606 04/15/24 0646 04/16/24 0619   WBC 5.7 5.2 5.9 5.9   Microbiology No results found for this or any previous visit (from the past 240 hours).   Time coordinating discharge: 35 minutes  SIGNED: Lesa Rape, MD  [facility] 04/16/2024, 9:40 AM  If 7PM-7AM, please contact night-coverage [email or web address]

## 2024-04-18 DIAGNOSIS — S42131D Displaced fracture of coracoid process, right shoulder, subsequent encounter for fracture with routine healing: Secondary | ICD-10-CM | POA: Diagnosis not present

## 2024-04-18 DIAGNOSIS — N179 Acute kidney failure, unspecified: Secondary | ICD-10-CM | POA: Diagnosis not present

## 2024-04-18 DIAGNOSIS — I4821 Permanent atrial fibrillation: Secondary | ICD-10-CM | POA: Diagnosis not present

## 2024-04-18 DIAGNOSIS — S2241XD Multiple fractures of ribs, right side, subsequent encounter for fracture with routine healing: Secondary | ICD-10-CM | POA: Diagnosis not present

## 2024-04-18 DIAGNOSIS — I5023 Acute on chronic systolic (congestive) heart failure: Secondary | ICD-10-CM | POA: Diagnosis not present

## 2024-04-24 DIAGNOSIS — L97909 Non-pressure chronic ulcer of unspecified part of unspecified lower leg with unspecified severity: Secondary | ICD-10-CM | POA: Diagnosis not present

## 2024-04-24 DIAGNOSIS — R234 Changes in skin texture: Secondary | ICD-10-CM | POA: Diagnosis not present

## 2024-04-25 DIAGNOSIS — E11621 Type 2 diabetes mellitus with foot ulcer: Secondary | ICD-10-CM | POA: Diagnosis not present

## 2024-04-25 DIAGNOSIS — I87311 Chronic venous hypertension (idiopathic) with ulcer of right lower extremity: Secondary | ICD-10-CM | POA: Diagnosis not present

## 2024-04-25 DIAGNOSIS — L97811 Non-pressure chronic ulcer of other part of right lower leg limited to breakdown of skin: Secondary | ICD-10-CM | POA: Diagnosis not present

## 2024-04-25 DIAGNOSIS — L97821 Non-pressure chronic ulcer of other part of left lower leg limited to breakdown of skin: Secondary | ICD-10-CM | POA: Diagnosis not present

## 2024-04-25 DIAGNOSIS — L97111 Non-pressure chronic ulcer of right thigh limited to breakdown of skin: Secondary | ICD-10-CM | POA: Diagnosis not present

## 2024-04-25 DIAGNOSIS — L97513 Non-pressure chronic ulcer of other part of right foot with necrosis of muscle: Secondary | ICD-10-CM | POA: Diagnosis not present

## 2024-05-01 DIAGNOSIS — M25511 Pain in right shoulder: Secondary | ICD-10-CM | POA: Diagnosis not present

## 2024-05-02 DIAGNOSIS — L97821 Non-pressure chronic ulcer of other part of left lower leg limited to breakdown of skin: Secondary | ICD-10-CM | POA: Diagnosis not present

## 2024-05-02 DIAGNOSIS — L97518 Non-pressure chronic ulcer of other part of right foot with other specified severity: Secondary | ICD-10-CM | POA: Diagnosis not present

## 2024-05-02 DIAGNOSIS — L97811 Non-pressure chronic ulcer of other part of right lower leg limited to breakdown of skin: Secondary | ICD-10-CM | POA: Diagnosis not present

## 2024-05-02 DIAGNOSIS — E11621 Type 2 diabetes mellitus with foot ulcer: Secondary | ICD-10-CM | POA: Diagnosis not present

## 2024-05-02 DIAGNOSIS — I87313 Chronic venous hypertension (idiopathic) with ulcer of bilateral lower extremity: Secondary | ICD-10-CM | POA: Diagnosis not present

## 2024-05-02 DIAGNOSIS — E1142 Type 2 diabetes mellitus with diabetic polyneuropathy: Secondary | ICD-10-CM | POA: Diagnosis not present

## 2024-05-09 ENCOUNTER — Ambulatory Visit (INDEPENDENT_AMBULATORY_CARE_PROVIDER_SITE_OTHER): Admitting: Audiology

## 2024-05-09 ENCOUNTER — Ambulatory Visit (INDEPENDENT_AMBULATORY_CARE_PROVIDER_SITE_OTHER): Admitting: Otolaryngology

## 2024-05-09 ENCOUNTER — Encounter (INDEPENDENT_AMBULATORY_CARE_PROVIDER_SITE_OTHER): Payer: Self-pay | Admitting: Otolaryngology

## 2024-05-09 VITALS — BP 129/66 | HR 79 | Ht 67.0 in | Wt 158.0 lb

## 2024-05-09 DIAGNOSIS — H906 Mixed conductive and sensorineural hearing loss, bilateral: Secondary | ICD-10-CM | POA: Diagnosis not present

## 2024-05-09 DIAGNOSIS — H903 Sensorineural hearing loss, bilateral: Secondary | ICD-10-CM

## 2024-05-09 DIAGNOSIS — H9313 Tinnitus, bilateral: Secondary | ICD-10-CM | POA: Diagnosis not present

## 2024-05-09 NOTE — Progress Notes (Signed)
  [address] [place], [place] [address] [phone]  Audiological Evaluation    Name: [name]     DOB:   12/24/1931      MRN:   [id number]                                                                                     Service Date: 05/09/2024     Accompanied by: daughter and son   Patient comes today after Dr. Tobie, ENT sent a referral for a hearing evaluation due to concerns with hearing loss.   Symptoms Yes Details  Hearing loss  [x]  Both ears, has trouble hearing others  Tinnitus  []    Ear pain/ infections/pressure  []    Balance problems  []    Noise exposure history  []    Previous ear surgeries  []    Family history of hearing loss  []    Amplification  [x]  Has a 88 year old set of hearing aids hat were fit elsewhere  Other  []      Otoscopy: Right ear: Clear external ear canal and notable landmarks visualized on the tympanic membrane. Left ear:  Clear external ear canal and notable landmarks visualized on the tympanic membrane.  Tympanometry: Right ear: Type A- Normal external ear canal volume with normal middle ear pressure and tympanic membrane compliance. Left ear: Type A- Normal external ear canal volume with normal middle ear pressure and tympanic membrane compliance.  Pure tone Audiometry: Right ear- Mild to profound sensorineural hearing loss from 125 Hz - 8000 Hz. Left ear-  Normal to profound sensorineural hearing loss from 125 Hz - 8000 Hz. A conducive component was obtained at 4000 Hz, in both ears.   Speech Audiometry: Right ear- Speech Reception Threshold (SRT) was obtained at 55 dBHL. Left ear-Speech Reception Threshold (SRT) was obtained at 50 dBHL.   Word Recognition Score Tested using NU-6 (recorded) Right ear: 80% was obtained at a presentation level of 95 dBHL with contralateral masking which is deemed as  good . Left ear: 84% was obtained at a presentation level of 90 dBHL with contralateral masking which is deemed as   good .   The hearing test results were completed under headphones and re-checked with inserts and results are deemed to be of good reliability. Test technique:  conventional    Recommendations: Follow up with ENT as scheduled for today. Consider a communication needs assessment after medical clearance for hearing aids is obtained or checking hearing aids using today's audiogram and real ear measurements.   Kevon Tench MARIE LEROUX-MARTINEZ, AUD

## 2024-05-09 NOTE — Progress Notes (Signed)
 Dear Dr. Cleatus, Here is my assessment for our mutual patient, [name]. Thank you for allowing me the opportunity to care for your patient. Please do not hesitate to contact me should you have any other questions. Sincerely, Dr. Eldora Blanch  Otolaryngology Clinic Note Referring provider: Dr. Cleatus HPI:  [name] is a 89 y.o. male kindly referred by Dr. Cleatus for evaluation of bilateral hearing loss Initial visit (04/2024):  Patient reports: long-standing hearing loss in both ears. Has noise exposure in the [employer] on an [employer]. He reports that he has had chronic, declining hearing loss with gradual decline. Wearing HA for past 25 years but feels like they are not working as well. Worst in noisy environments/background noise. Some memory decline as well. Some bilateral tinnitus non-pulsatile Patient denies: ear pain, fullness, vertigo, drainage Patient additionally denies: deep pain in ear canal, eustachian tube symptoms such as popping/crackling, sensitive to pressure changes Patient also denies barotrauma, vestibular suppressant use, ototoxic medication use Prior ear surgery: no  Personal or FHx of bleeding dz or anesthesia difficulty: no  AP/AC: yes  Tobacco: former, quit  PMHx: A-fib, T2DM, CHF, CAD, CKD, HLD, OSA on CPAP, HTN  Independent Review of Additional Tests or Records:  [name] (02/20/2024): noted hearing loss, chronic; possibly wants to get set up for better hearing aids; Dx: HL; Rx: ref to ENT BMP 04/16/2024: BUN/Cr 35/3.21 CTH 04/12/2024 independently interpreted with respect to ears: cuts thick so suboptimal eval but mastoids and ME well aerated; unable to appreciate otic capsule or ossicular chain pathology but again, cuts thick so suboptimal eval 04/2024 Audiogram was independently reviewed and interpreted by me and it reveals - bilateral downsloping SNHL with A/A tymps and WRT 80 and 84% AD/AS at 95/90dB;   SNHL= Sensorineural hearing  loss  PMH/Meds/All/SocHx/FamHx/ROS:   Past Medical History:  Diagnosis Date   Age-related macular degeneration, wet, both eyes ([name])    Anemia    Secondary to acute blood loss   Anginal pain ([name])    last chest pain in Feb 2021   Arthritis    mild in hands, knees, ankles (11/13/2015)   Atrial fibrillation ([name])    Consideration was given for atrial flutter ablation, but patient developed atrial fibrillation. Cardioversion was done. Dr. Fernande decided to watch him clinically. November, 2011   Atrial flutter [facility]) 07/2010   September, 2011   Hospital with PNA and cath done...Coumadin .  Atrial flutter ablation planned, but  pt. then had atrial fibrillation,/outpatient conversion 09/08/10..NSR..plan to follow..Dr. Fernande   CAD (coronary artery disease)    Catheterization, September, 2011,  grafts patent from redo CABG,, medical therapy of coronary disease, consideration to proceeding with atrial flutter ablation   Carotid artery disease ([name])    Doppler 09/18/2009 - 49% bilateral stenoses   Carotid artery disease ([name])    49% bilateral, Doppler, November, 2010   CHF (congestive heart failure) ([name])    Chronic kidney disease (CKD), stage III (moderate) ([name])    Diabetic peripheral neuropathy ([name])    feet   Diverticulosis of colon with hemorrhage 2009   several unit diverticular bleed    Erectile dysfunction    Mild   Gout    Hearing loss    wears hearing aids   History of blood transfusion several   related to diverticular bleeding   Hyperlipidemia    Hypertension    Mitral regurgitation    Mild, echo, September, 2011   NSTEMI (non-ST elevated myocardial infarction) ([name]) 07/2010  at [facility] with repeat cath, rec medical mgmt    OSA on CPAP    uses CPAP nightly   Personal history of colonic polyps    PNA (pneumonia) 07/2010   NSTEMI at [facility] with repeat cath, rec medical mgmt    RBBB (right bundle branch block)    S/P mitral valve repair 02/06/2020   s/p TEER with a single  MitraClip NTW placed on A2/P2   Shoulder pain    positional; better now (11/13/2015)   Skin cancer    R lower leg, per derm 2012   Type II diabetes mellitus [facility])      Past Surgical History:  Procedure Laterality Date   CARDIAC CATHETERIZATION  2006   Nuclear..slight lateral ischemia..medical therapy   CARDIAC CATHETERIZATION  08/04/2010   grafts patent from redo CABG...medical Rx and ablate Atrial flutter (LV not injected)    CARDIOVERSION  ~ 2010   CAROTID ENDARTERECTOMY Right 1994   CATARACT EXTRACTION W/ INTRAOCULAR LENS  IMPLANT, BILATERAL Bilateral    CHOLECYSTECTOMY     COLON RESECTION N/A 01/13/2016   Procedure: EXPLORATORY LAPAROTOMY, LEFT AND SIGMOID COLON REMOVAL;  Surgeon: Lynda Leos, MD;  Location: [facility] OR;  Service: General;  Laterality: N/A;  Extended open left hemicolectomy and sigmoidectomy    COLONOSCOPY N/A 11/14/2015   Procedure: COLONOSCOPY;  Surgeon: Elspeth Deward Naval, MD;  Location: [facility] ENDOSCOPY;  Service: Gastroenterology;  Laterality: N/A;   COLONOSCOPY Left 01/05/2016   Procedure: COLONOSCOPY;  Surgeon: Elspeth Deward Naval, MD;  Location: [facility] ENDOSCOPY;  Service: Gastroenterology;  Laterality: Left;  no sedation to start, moderate if needed   COLONOSCOPY W/ POLYPECTOMY     CORONARY ARTERY BYPASS GRAFT  1995; 2006   X 3; X3   CORONARY ARTERY BYPASS GRAFT  1995, 2006   CORONARY STENT INTERVENTION N/A 02/02/2024   Procedure: CORONARY STENT INTERVENTION;  Surgeon: Wonda Sharper, MD;  Location: [facility] INVASIVE CV LAB;  Service: Cardiovascular;  Laterality: N/A;   DOPPLER ECHOCARDIOGRAPHY  08/2008   EF 60%   DOPPLER ECHOCARDIOGRAPHY  08/02/2010   65-70%   DOPPLER ECHOCARDIOGRAPHY  07/2010   MR mild   ESOPHAGOGASTRODUODENOSCOPY N/A 06/19/2014   Procedure: ESOPHAGOGASTRODUODENOSCOPY (EGD);  Surgeon: Gordy CHRISTELLA Starch, MD;  Location: [facility] ENDOSCOPY;  Service: Endoscopy;  Laterality: N/A;   ESOPHAGOGASTRODUODENOSCOPY N/A 11/14/2015   Procedure:  ESOPHAGOGASTRODUODENOSCOPY (EGD);  Surgeon: Elspeth Deward Naval, MD;  Location: [facility] ENDOSCOPY;  Service: Gastroenterology;  Laterality: N/A;   ESOPHAGOGASTRODUODENOSCOPY (EGD) WITH PROPOFOL  N/A 08/18/2023   Procedure: ESOPHAGOGASTRODUODENOSCOPY (EGD) WITH PROPOFOL ;  Surgeon: San Sandor GAILS, DO;  Location: [facility] ENDOSCOPY;  Service: Gastroenterology;  Laterality: N/A;   FLEXIBLE SIGMOIDOSCOPY N/A 11/16/2015   Procedure: FLEXIBLE SIGMOIDOSCOPY;  Surgeon: Elspeth Deward Naval, MD;  Location: [facility] ENDOSCOPY;  Service: Gastroenterology;  Laterality: N/A;   LAPAROSCOPIC CHOLECYSTECTOMY  2008   LEFT HEART CATH N/A 06/14/2014   Procedure: LEFT HEART CATH;  Surgeon: Victory LELON Claudene DOUGLAS, MD;  Location: [facility] CATH LAB;  Service: Cardiovascular;  Laterality: N/A;   LEFT HEART CATH AND CORONARY ANGIOGRAPHY N/A 08/02/2018   Procedure: LEFT HEART CATH AND CORONARY ANGIOGRAPHY;  Surgeon: Court Dorn PARAS, MD;  Location: [facility] INVASIVE CV LAB;  Service: Cardiovascular;  Laterality: N/A;   LEFT HEART CATHETERIZATION WITH CORONARY/GRAFT ANGIOGRAM N/A 06/11/2014   Procedure: LEFT HEART CATHETERIZATION WITH EL BILE;  Surgeon: Victory LELON Claudene DOUGLAS, MD;  Location: [facility] CATH LAB;  Service: Cardiovascular;  Laterality: N/A;   PERCUTANEOUS CORONARY STENT INTERVENTION (PCI-S) N/A 06/13/2014   Procedure: PERCUTANEOUS  CORONARY STENT INTERVENTION (PCI-S);  Surgeon: Victory LELON Claudene DOUGLAS, MD;  Location: [facility] CATH LAB;  Service: Cardiovascular;  Laterality: N/A;   RIGHT HEART CATH N/A 01/06/2020   Procedure: RIGHT HEART CATH;  Surgeon: Cherrie Toribio SAUNDERS, MD;  Location: [facility] INVASIVE CV LAB;  Service: Cardiovascular;  Laterality: N/A;   RIGHT/LEFT HEART CATH AND CORONARY/GRAFT ANGIOGRAPHY N/A 02/02/2024   Procedure: RIGHT/LEFT HEART CATH AND CORONARY/GRAFT ANGIOGRAPHY;  Surgeon: Wonda Sharper, MD;  Location: [facility] INVASIVE CV LAB;  Service: Cardiovascular;  Laterality: N/A;   SKIN CANCER EXCISION Left 10/2015   calf   SKIN  CANCER EXCISION Right 2014?   chest   TEE WITHOUT CARDIOVERSION N/A 01/06/2020   Procedure: TRANSESOPHAGEAL ECHOCARDIOGRAM (TEE);  Surgeon: Cherrie Toribio SAUNDERS, MD;  Location: [facility] ENDOSCOPY;  Service: Cardiovascular;  Laterality: N/A;   TONSILLECTOMY     TRANSCATHETER MITRAL EDGE TO EDGE REPAIR N/A 02/06/2020   Procedure: MITRAL VALVE REPAIR;  Surgeon: Wonda Sharper, MD;  Location: [facility] INVASIVE CV LAB;  Service: Cardiovascular;  Laterality: N/A;   VASECTOMY      Family History  Problem Relation Age of Onset   Kidney disease Mother        Kidney failure   Stroke Mother    Diabetes Mother    Heart disease Father        MI   Arthritis Sister    Cancer Sister        Throat   Heart disease Sister        MI   Diabetes Sister    Prostate cancer Neg Hx    Colon cancer Neg Hx      Social Connections: Moderately Isolated (04/13/2024)   Social Connection and Isolation Panel    Frequency of Communication with Friends and Family: More than three times a week    Frequency of Social Gatherings with Friends and Family: More than three times a week    Attends Religious Services: Never    Database administrator or Organizations: Yes    Attends Banker Meetings: 1 to 4 times per year    Marital Status: Widowed      Current Outpatient Medications:    acetaminophen  (TYLENOL ) 325 MG tablet, Take 2 tablets (650 mg total) by mouth every 6 (six) hours as needed for mild pain (pain score 1-3) (or Fever >/= 101). (Patient taking differently: Take 650 mg by mouth every 6 (six) hours as needed for moderate pain (pain score 4-6) (or Fever >/= 101).), Disp: 20 tablet, Rfl: 0   allopurinol  (ZYLOPRIM ) 100 MG tablet, Take 0.5 tablets (50 mg total) by mouth every Monday, Wednesday, and Friday., Disp: 20 tablet, Rfl: 3   aspirin  81 MG chewable tablet, Chew 1 tablet (81 mg total) by mouth daily., Disp: , Rfl:    Cholecalciferol  (VITAMIN D ) 1000 UNITS capsule, Take 1,000 Units by mouth at bedtime.,  Disp: , Rfl:    ELIQUIS  2.5 MG TABS tablet, TAKE 1 TABLET TWICE A DAY, Disp: 180 tablet, Rfl: 3   fish oil-omega-3 fatty acids  1000 MG capsule, Take 1 g by mouth at bedtime., Disp: , Rfl:    glucose blood (FREESTYLE LITE) test strip, USE TO TEST BLOOD SUGAR ONCE DAILY AND AS NEEDED, Disp: 300 strip, Rfl: 3   hydrALAZINE  (APRESOLINE ) 25 MG tablet, Take 1 tablet (25 mg total) by mouth daily., Disp: 90 tablet, Rfl: 3   ipratropium (ATROVENT ) 0.03 % nasal spray, USE 2 SPRAYS IN EACH NOSTRIL TWICE A DAY AS NEEDED  FOR RHINITIS, Disp: 60 mL, Rfl: 3   isosorbide  mononitrate (IMDUR ) 60 MG 24 hr tablet, Take 1.5 tablets (90 mg total) by mouth daily., Disp: 135 tablet, Rfl: 1   JARDIANCE  10 MG TABS tablet, Take 10 mg by mouth daily., Disp: , Rfl:    lidocaine  (LIDODERM ) 5 %, Place 1 patch onto the skin daily. Remove & Discard patch within 12 hours or as directed by MD, Disp: , Rfl:    magnesium  oxide (MAG-OX) 400 MG tablet, Take 1 tablet (400 mg total) by mouth every other day., Disp: , Rfl:    metoprolol  succinate (TOPROL -XL) 100 MG 24 hr tablet, Take 1 tablet (100 mg total) by mouth daily. Take with or immediately following a meal., Disp: 90 tablet, Rfl: 3   nitroGLYCERIN  (NITROSTAT ) 0.4 MG SL tablet, DISSOLVE 1 TABLET UNDER THE TONGUE EVERY 5 MINUTES AS NEEDED FOR CHEST PAIN FOR A MAXIMUM OF 3 DOSES, Disp: 25 tablet, Rfl: 11   polyethylene glycol (MIRALAX  / GLYCOLAX ) 17 g packet, Take 17 g by mouth daily., Disp: , Rfl:    Potassium Gluconate 595 MG CAPS, Take 595 mg by mouth in the morning., Disp: , Rfl:    rosuvastatin  (CRESTOR ) 10 MG tablet, TAKE 1 TABLET DAILY, Disp: 90 tablet, Rfl: 3   torsemide  (DEMADEX ) 20 MG tablet, Take 1 tab daily in the AM if weight is increasing. (Patient taking differently: Take 20 mg by mouth 2 (two) times daily. Take 1 tab daily in the AM if weight is increasing.), Disp: , Rfl:    Physical Exam:   BP 129/66 (BP Location: Left Arm, Patient Position: Sitting, Cuff Size: Large)    Pulse 79   Ht 5' 7 (1.702 m)   Wt 158 lb (71.7 kg)   SpO2 96%   BMI 24.75 kg/m   Salient findings:  CN II-XII intact Bilateral EAC clear and TM intact with well pneumatized middle ear spaces Anterior rhinoscopy: Septum intact; bilateral inferior turbinates without significant hypertrophy No lesions of oral cavity/oropharynx No obviously palpable neck masses/lymphadenopathy/thyromegaly No respiratory distress or stridor  Seprately Identifiable Procedures:  Prior to initiating any procedures, risks/benefits/alternatives were explained to the patient and verbal consent obtained. None today  Impression & Plans:  [name] is a 89 y.o. male with:  1. Sensorineural hearing loss (SNHL) of both ears   2. Bilateral tinnitus    Worsening hearing, now impacting him. B/l SNHL, not a candidate for CI. Has not had HA updated in a while, so would recommend starting there. Will provide resources for this F/u with me as needed  See below regarding exact medications prescribed this encounter including dosages and route: No orders of the defined types were placed in this encounter.     Thank you for allowing me the opportunity to care for your patient. Please do not hesitate to contact me should you have any other questions.  Sincerely, Eldora Blanch, MD Otolaryngologist (ENT), [facility] Phone: [phone] Fax: [phone]  05/09/2024, 11:38 AM   MDM:  Level 4 - [phone] Complexity/Problems addressed: mod - chronic worsening problem Data complexity: mod  independent review of notes, labs; ordering test; independent interpretation of imaging - Morbidity: low  - Prescription Drug prescribed or managed: no

## 2024-05-13 ENCOUNTER — Ambulatory Visit: Admitting: Family Medicine

## 2024-05-15 DIAGNOSIS — L0390 Cellulitis, unspecified: Secondary | ICD-10-CM | POA: Diagnosis not present

## 2024-05-16 DIAGNOSIS — I87313 Chronic venous hypertension (idiopathic) with ulcer of bilateral lower extremity: Secondary | ICD-10-CM | POA: Diagnosis not present

## 2024-05-16 DIAGNOSIS — E11621 Type 2 diabetes mellitus with foot ulcer: Secondary | ICD-10-CM | POA: Diagnosis not present

## 2024-05-16 DIAGNOSIS — L97513 Non-pressure chronic ulcer of other part of right foot with necrosis of muscle: Secondary | ICD-10-CM | POA: Diagnosis not present

## 2024-05-16 DIAGNOSIS — L97821 Non-pressure chronic ulcer of other part of left lower leg limited to breakdown of skin: Secondary | ICD-10-CM | POA: Diagnosis not present

## 2024-05-16 DIAGNOSIS — L97811 Non-pressure chronic ulcer of other part of right lower leg limited to breakdown of skin: Secondary | ICD-10-CM | POA: Diagnosis not present

## 2024-05-16 DIAGNOSIS — E1142 Type 2 diabetes mellitus with diabetic polyneuropathy: Secondary | ICD-10-CM | POA: Diagnosis not present

## 2024-05-20 DIAGNOSIS — E119 Type 2 diabetes mellitus without complications: Secondary | ICD-10-CM | POA: Diagnosis not present

## 2024-05-20 DIAGNOSIS — I10 Essential (primary) hypertension: Secondary | ICD-10-CM | POA: Diagnosis not present

## 2024-05-20 DIAGNOSIS — E789 Disorder of lipoprotein metabolism, unspecified: Secondary | ICD-10-CM | POA: Diagnosis not present

## 2024-05-20 DIAGNOSIS — R531 Weakness: Secondary | ICD-10-CM | POA: Diagnosis not present

## 2024-05-20 DIAGNOSIS — K219 Gastro-esophageal reflux disease without esophagitis: Secondary | ICD-10-CM | POA: Diagnosis not present

## 2024-05-21 ENCOUNTER — Ambulatory Visit: Payer: Self-pay

## 2024-05-21 ENCOUNTER — Encounter: Payer: Self-pay | Admitting: Audiology

## 2024-05-21 ENCOUNTER — Telehealth: Payer: Self-pay

## 2024-05-21 DIAGNOSIS — L97512 Non-pressure chronic ulcer of other part of right foot with fat layer exposed: Secondary | ICD-10-CM | POA: Diagnosis not present

## 2024-05-21 DIAGNOSIS — E1122 Type 2 diabetes mellitus with diabetic chronic kidney disease: Secondary | ICD-10-CM | POA: Diagnosis not present

## 2024-05-21 DIAGNOSIS — L97811 Non-pressure chronic ulcer of other part of right lower leg limited to breakdown of skin: Secondary | ICD-10-CM | POA: Diagnosis not present

## 2024-05-21 DIAGNOSIS — E785 Hyperlipidemia, unspecified: Secondary | ICD-10-CM | POA: Diagnosis not present

## 2024-05-21 DIAGNOSIS — E11621 Type 2 diabetes mellitus with foot ulcer: Secondary | ICD-10-CM | POA: Diagnosis not present

## 2024-05-21 DIAGNOSIS — J918 Pleural effusion in other conditions classified elsewhere: Secondary | ICD-10-CM | POA: Diagnosis not present

## 2024-05-21 DIAGNOSIS — E1142 Type 2 diabetes mellitus with diabetic polyneuropathy: Secondary | ICD-10-CM | POA: Diagnosis not present

## 2024-05-21 DIAGNOSIS — D631 Anemia in chronic kidney disease: Secondary | ICD-10-CM | POA: Diagnosis not present

## 2024-05-21 DIAGNOSIS — I272 Pulmonary hypertension, unspecified: Secondary | ICD-10-CM | POA: Diagnosis not present

## 2024-05-21 DIAGNOSIS — I6529 Occlusion and stenosis of unspecified carotid artery: Secondary | ICD-10-CM | POA: Diagnosis not present

## 2024-05-21 DIAGNOSIS — L97821 Non-pressure chronic ulcer of other part of left lower leg limited to breakdown of skin: Secondary | ICD-10-CM | POA: Diagnosis not present

## 2024-05-21 DIAGNOSIS — N184 Chronic kidney disease, stage 4 (severe): Secondary | ICD-10-CM | POA: Diagnosis not present

## 2024-05-21 DIAGNOSIS — N179 Acute kidney failure, unspecified: Secondary | ICD-10-CM | POA: Diagnosis not present

## 2024-05-21 DIAGNOSIS — I872 Venous insufficiency (chronic) (peripheral): Secondary | ICD-10-CM | POA: Diagnosis not present

## 2024-05-21 DIAGNOSIS — E1151 Type 2 diabetes mellitus with diabetic peripheral angiopathy without gangrene: Secondary | ICD-10-CM | POA: Diagnosis not present

## 2024-05-21 DIAGNOSIS — I5032 Chronic diastolic (congestive) heart failure: Secondary | ICD-10-CM | POA: Diagnosis not present

## 2024-05-21 DIAGNOSIS — I130 Hypertensive heart and chronic kidney disease with heart failure and stage 1 through stage 4 chronic kidney disease, or unspecified chronic kidney disease: Secondary | ICD-10-CM | POA: Diagnosis not present

## 2024-05-21 DIAGNOSIS — K219 Gastro-esophageal reflux disease without esophagitis: Secondary | ICD-10-CM | POA: Diagnosis not present

## 2024-05-21 DIAGNOSIS — M109 Gout, unspecified: Secondary | ICD-10-CM | POA: Diagnosis not present

## 2024-05-21 DIAGNOSIS — I5023 Acute on chronic systolic (congestive) heart failure: Secondary | ICD-10-CM | POA: Diagnosis not present

## 2024-05-21 DIAGNOSIS — I4892 Unspecified atrial flutter: Secondary | ICD-10-CM | POA: Diagnosis not present

## 2024-05-21 DIAGNOSIS — I2510 Atherosclerotic heart disease of native coronary artery without angina pectoris: Secondary | ICD-10-CM | POA: Diagnosis not present

## 2024-05-21 DIAGNOSIS — S52044D Nondisplaced fracture of coronoid process of right ulna, subsequent encounter for closed fracture with routine healing: Secondary | ICD-10-CM | POA: Diagnosis not present

## 2024-05-21 DIAGNOSIS — S2241XD Multiple fractures of ribs, right side, subsequent encounter for fracture with routine healing: Secondary | ICD-10-CM | POA: Diagnosis not present

## 2024-05-21 DIAGNOSIS — I4821 Permanent atrial fibrillation: Secondary | ICD-10-CM | POA: Diagnosis not present

## 2024-05-21 MED ORDER — ACETAMINOPHEN 325 MG PO TABS: 325.0000 mg | ORAL_TABLET | Freq: Four times a day (QID) | ORAL | 0 refills | Status: AC | PRN

## 2024-05-21 NOTE — Telephone Encounter (Signed)
 Noted. Thanks.

## 2024-05-21 NOTE — Telephone Encounter (Signed)
 FYI Only or Action Required?: FYI only for provider.  Patient was last seen in primary care on 02/12/2024 by Vincente Shivers, NP.  Called Nurse Triage reporting Leg Swelling.  Symptoms began today.  Interventions attempted: Nothing.  Symptoms are: gradually worsening.  Triage Disposition: See HCP Within 4 Hours (Or PCP Triage) To ER Patient/caregiver understands and will follow disposition?: Yes     Copied from CRM [id number]. Topic: Clinical - Red Word Triage >> May 21, 2024  1:40 PM Thersia C wrote: [facility] that prompted transfer to Nurse Triage: Amy from [facility] called in stated patient legs is very swollen from thigh down, was diagnose with cellulitis , would like for him to be seen    ----------------------------------------------------------------------- From previous Reason for Contact - Scheduling: Patient/patient representative is calling to schedule an appointment. Refer to attachments for appointment information. Reason for Disposition  SEVERE leg swelling (e.g., swelling extends above knee, entire leg is swollen, weeping fluid)  Answer Assessment - Initial Assessment Questions 1. ONSET: When did the swelling start? (e.g., minutes, hours, days)     Recently diagnosed with cellulitis; was in rehab up until yesterday 2. LOCATION: What part of the leg is swollen?  Are both legs swollen or just one leg?     Both legs up past knee into thigh 3. SEVERITY: How bad is the swelling? (e.g., localized; mild, moderate, severe)   - Localized: Small area of swelling localized to one leg.   - MILD pedal edema: Swelling limited to foot and ankle, pitting edema < 1/4 inch (6 mm) deep, rest and elevation eliminate most or all swelling.   - MODERATE edema: Swelling of lower leg to knee, pitting edema > 1/4 inch (6 mm) deep, rest and elevation only partially reduce swelling.   - SEVERE edema: Swelling extends above knee, facial or hand swelling present.      severe 4.  REDNESS: Does the swelling look red or infected?     Yes and warm 5. PAIN: Is the swelling painful to touch? If Yes, ask: How painful is it?   (Scale 1-10; mild, moderate or severe)     no 6. FEVER: Do you have a fever? If Yes, ask: What is it, how was it measured, and when did it start?      no 7. CAUSE: What do you think is causing the leg swelling?     unknown 8. MEDICAL HISTORY: Do you have a history of blood clots (e.g., DVT), cancer, heart failure, kidney disease, or liver failure?     chf 9. RECURRENT SYMPTOM: Have you had leg swelling before? If Yes, ask: When was the last time? What happened that time?     yes 10. OTHER SYMPTOMS: Do you have any other symptoms? (e.g., chest pain, difficulty breathing)       no 11. PREGNANCY: Is there any chance you are pregnant? When was your last menstrual period?       na  Protocols used: Leg Swelling and Edema-A-AH

## 2024-05-21 NOTE — Transitions of Care (Post Inpatient/ED Visit) (Signed)
 05/21/2024  Name: [name] MRN: [id number] DOB: 14-Jun-1932  Today's TOC FU Call Status: Today's TOC FU Call Status:: Successful TOC FU Call Completed TOC FU Call Complete Date: 05/21/24 Patient's Name and Date of Birth confirmed.  Transition Care Management Follow-up Telephone Call Date of Discharge: 05/20/24 Discharge Facility: Other [facility]) Name of Other ([facility]) Discharge Facility: [facility] Type of Discharge: Inpatient Admission Primary Inpatient Discharge Diagnosis:: multi fractures How have you been since you were released from the hospital?: Better Any questions or concerns?: No  Items Reviewed: Did you receive and understand the discharge instructions provided?: Yes Medications obtained,verified, and reconciled?: Yes (Medications Reviewed) Any new allergies since your discharge?: No Dietary orders reviewed?: Yes Do you have support at home?: Yes People in Home [RPT]: child(ren), adult  Medications Reviewed Today: Medications Reviewed Today     Reviewed by Emmitt Pan, LPN (Licensed Practical Nurse) on 05/21/24 at 1131  Med List Status: <None>   Medication Order Taking? Sig Documenting Provider Last Dose Status Informant  acetaminophen  (TYLENOL ) 325 MG tablet [id number] Yes Take 2 tablets (650 mg total) by mouth every 6 (six) hours as needed for mild pain (pain score 1-3) (or Fever >/= 101).  Patient taking differently: Take 650 mg by mouth every 6 (six) hours as needed for moderate pain (pain score 4-6) (or Fever >/= 101).   Sherrill Cable [place], [place]  Active Self, Pharmacy Records  allopurinol  (ZYLOPRIM ) 100 MG tablet [id number] Yes Take 0.5 tablets (50 mg total) by mouth every Monday, Wednesday, and Friday. Cleatus Arlyss RAMAN, MD  Active Self, Pharmacy Records  aspirin  81 MG chewable tablet [id number] Yes Chew 1 tablet (81 mg total) by mouth daily. Christobal Guadalajara, MD  Active   Cholecalciferol  (VITAMIN D ) 1000 UNITS capsule [id number] Yes Take 1,000 Units by  mouth at bedtime. [provider]  Active Self, Pharmacy Records  ELIQUIS  2.5 MG TABS tablet [id number] Yes TAKE 1 TABLET TWICE A DAY Court Dorn PARAS, MD  Active Self, Pharmacy Records  fish oil-omega-3 fatty acids  1000 MG capsule [id number] Yes Take 1 g by mouth at bedtime. [provider]  Active Self, Pharmacy Records  glucose blood (FREESTYLE LITE) test strip [id number] Yes USE TO TEST BLOOD SUGAR ONCE DAILY AND AS NEEDED Cleatus Arlyss RAMAN, MD  Active Self, Pharmacy Records  hydrALAZINE  (APRESOLINE ) 25 MG tablet [id number] Yes Take 1 tablet (25 mg total) by mouth daily. Court Dorn PARAS, MD  Active Self, Pharmacy Records  ipratropium (ATROVENT ) 0.03 % nasal spray [id number] Yes USE 2 SPRAYS IN EACH NOSTRIL TWICE A DAY AS NEEDED FOR RHINITIS Cleatus Arlyss RAMAN, MD  Active Self, Pharmacy Records  isosorbide  mononitrate (IMDUR ) 60 MG 24 hr tablet [id number] Yes Take 1.5 tablets (90 mg total) by mouth daily. Court Dorn PARAS, MD  Active Self, Pharmacy Records           Med Note MAYER, ARLYSS RAMAN Schaumann Feb 08, 2024 12:26 PM)    JARDIANCE  10 MG TABS tablet [id number] Yes Take 10 mg by mouth daily. [provider]  Active Self, Pharmacy Records  lidocaine  (LIDODERM ) 5 % [id number] Yes Place 1 patch onto the skin daily. Remove & Discard patch within 12 hours or as directed by MD Christobal Guadalajara, MD  Active   magnesium  oxide (MAG-OX) 400 MG tablet [id number] Yes Take 1 tablet (400 mg total) by mouth every other day. Cleatus Arlyss RAMAN, MD  Active Self, Pharmacy Records  metoprolol  succinate (TOPROL -XL) 100 MG 24 hr tablet  [id number] Yes Take 1 tablet (100 mg total) by mouth daily. Take with or immediately following a meal.  Patient taking differently: Take 100 mg by mouth daily. 1/2 tab Mon, Wed and Kerman Wonda Sharper, MD  Active Self, Pharmacy Records  nitroGLYCERIN  (NITROSTAT ) 0.4 MG SL tablet [id number] Yes DISSOLVE 1 TABLET UNDER THE TONGUE EVERY 5 MINUTES AS NEEDED FOR CHEST PAIN FOR A  MAXIMUM OF 3 DOSES Court Dorn PARAS, MD  Active Self, Pharmacy Records           Med Note [facility] [place], [place] A   Fri Apr 12, 2024  7:49 PM)    polyethylene glycol (MIRALAX  / GLYCOLAX ) 17 g packet [id number] Yes Take 17 g by mouth daily. [provider]  Active Self, Pharmacy Records  Potassium Gluconate 595 MG CAPS [id number] Yes Take 595 mg by mouth in the morning. [provider]  Active Self, Pharmacy Records  ranolazine  (RANEXA ) 500 MG 12 hr tablet [id number] Yes Take 250 mg by mouth daily. [provider]  Active   rosuvastatin  (CRESTOR ) 10 MG tablet [id number] Yes TAKE 1 TABLET DAILY Court Dorn PARAS, MD  Active Self, Pharmacy Records  torsemide  (DEMADEX ) 20 MG tablet [id number] Yes Take 1 tab daily in the AM if weight is increasing.  Patient taking differently: Take 20 mg by mouth 2 (two) times daily. Take 1 tab daily in the AM if weight is increasing.   Cleatus Arlyss RAMAN, MD  Active Self, Pharmacy Records  Med List Note Efraim Alfreida CROME, CPhT 01/31/24 1615): [facility] is his preferred long term medication pharmacy            Home Care and Equipment/Supplies: Were Home Health Services Ordered?: Yes Name of Home Health Agency:: [facility] Has Agency set up a time to come to your home?: Yes First Home Health Visit Date: 05/21/24 Any new equipment or medical supplies ordered?: NA  Functional Questionnaire: Do you need assistance with bathing/showering or dressing?: Yes Do you need assistance with meal preparation?: Yes Do you need assistance with eating?: No Do you have difficulty maintaining continence: No Do you need assistance with getting out of bed/getting out of a chair/moving?: No Do you have difficulty managing or taking your medications?: No  Follow up appointments reviewed: PCP Follow-up appointment confirmed?: Yes Date of PCP follow-up appointment?: 05/27/24 Follow-up Provider: [facility] Follow-up appointment  confirmed?: NA Do you need transportation to your follow-up appointment?: No Do you understand care options if your condition(s) worsen?: Yes-patient verbalized understanding    SIGNATURE Julian Lemmings, LPN [facility] Nurse Health Advisor Direct Dial [phone]

## 2024-05-22 ENCOUNTER — Encounter: Payer: Self-pay | Admitting: Internal Medicine

## 2024-05-22 ENCOUNTER — Ambulatory Visit (INDEPENDENT_AMBULATORY_CARE_PROVIDER_SITE_OTHER): Admitting: Internal Medicine

## 2024-05-22 VITALS — BP 128/78 | HR 86 | Temp 98.1°F | Ht 67.0 in | Wt 162.0 lb

## 2024-05-22 DIAGNOSIS — I83009 Varicose veins of unspecified lower extremity with ulcer of unspecified site: Secondary | ICD-10-CM | POA: Insufficient documentation

## 2024-05-22 DIAGNOSIS — I5032 Chronic diastolic (congestive) heart failure: Secondary | ICD-10-CM | POA: Diagnosis not present

## 2024-05-22 DIAGNOSIS — I5022 Chronic systolic (congestive) heart failure: Secondary | ICD-10-CM

## 2024-05-22 DIAGNOSIS — I83002 Varicose veins of unspecified lower extremity with ulcer of calf: Secondary | ICD-10-CM

## 2024-05-22 DIAGNOSIS — N184 Chronic kidney disease, stage 4 (severe): Secondary | ICD-10-CM | POA: Diagnosis not present

## 2024-05-22 DIAGNOSIS — E1151 Type 2 diabetes mellitus with diabetic peripheral angiopathy without gangrene: Secondary | ICD-10-CM | POA: Diagnosis not present

## 2024-05-22 DIAGNOSIS — L97201 Non-pressure chronic ulcer of unspecified calf limited to breakdown of skin: Secondary | ICD-10-CM

## 2024-05-22 DIAGNOSIS — E1122 Type 2 diabetes mellitus with diabetic chronic kidney disease: Secondary | ICD-10-CM | POA: Diagnosis not present

## 2024-05-22 DIAGNOSIS — I130 Hypertensive heart and chronic kidney disease with heart failure and stage 1 through stage 4 chronic kidney disease, or unspecified chronic kidney disease: Secondary | ICD-10-CM | POA: Diagnosis not present

## 2024-05-22 DIAGNOSIS — I5023 Acute on chronic systolic (congestive) heart failure: Secondary | ICD-10-CM | POA: Diagnosis not present

## 2024-05-22 MED ORDER — DOXYCYCLINE HYCLATE 100 MG PO TABS: 100.0000 mg | ORAL_TABLET | Freq: Two times a day (BID) | ORAL | 0 refills | Status: DC

## 2024-05-22 MED ORDER — HYDRALAZINE HCL 25 MG PO TABS: 25.0000 mg | ORAL_TABLET | Freq: Two times a day (BID) | ORAL | 0 refills | Status: DC

## 2024-05-22 NOTE — Assessment & Plan Note (Signed)
 On isosorbide  and hydralazine  was on hold Will restart the hydralazine  at 25 bid

## 2024-05-22 NOTE — Progress Notes (Signed)
 Subjective:    Patient ID: [name], male    DOB: 1932/07/16, 89 y.o.   MRN: [id number]  HPI Here due to ongoing trouble with his legs With daughter and son  Had been hospitalized then extended rehab at [facility] Does have follow up with PCP next week  Ongoing swelling in legs Especially bad yesteday Bad smell when dressings taken off Did have cellulitis while at [facility] (cephalexin ) Home 2 days ago--only Rx for 4 days  Diabetic, neuropathy  Some leg pain No fever Takes torsemide  and does diurese Keeps legs down --has recliner and just starting to put them up  GFR in teens Sees nephrologist  Current Outpatient Medications on File Prior to Visit  Medication Sig Dispense Refill   acetaminophen  (TYLENOL ) 325 MG tablet Take 1 tablet (325 mg total) by mouth every 6 (six) hours as needed for mild pain (pain score 1-3) (or Fever >/= 101). 20 tablet 0   allopurinol  (ZYLOPRIM ) 100 MG tablet Take 0.5 tablets (50 mg total) by mouth every Monday, Wednesday, and Friday. 20 tablet 3   aspirin  81 MG chewable tablet Chew 1 tablet (81 mg total) by mouth daily.     Cholecalciferol  (VITAMIN D ) 1000 UNITS capsule Take 1,000 Units by mouth at bedtime.     ELIQUIS  2.5 MG TABS tablet TAKE 1 TABLET TWICE A DAY 180 tablet 3   fish oil-omega-3 fatty acids  1000 MG capsule Take 1 g by mouth at bedtime.     glucose blood (FREESTYLE LITE) test strip USE TO TEST BLOOD SUGAR ONCE DAILY AND AS NEEDED 300 strip 3   hydrALAZINE  (APRESOLINE ) 25 MG tablet Take 1 tablet (25 mg total) by mouth daily. 90 tablet 3   ipratropium (ATROVENT ) 0.03 % nasal spray USE 2 SPRAYS IN EACH NOSTRIL TWICE A DAY AS NEEDED FOR RHINITIS 60 mL 3   isosorbide  mononitrate (IMDUR ) 60 MG 24 hr tablet Take 1.5 tablets (90 mg total) by mouth daily. 135 tablet 1   JARDIANCE  10 MG TABS tablet Take 10 mg by mouth daily.     lidocaine  (LIDODERM ) 5 % Place 1 patch onto the skin daily. Remove & Discard patch within 12 hours or as  directed by MD     magnesium  oxide (MAG-OX) 400 MG tablet Take 1 tablet (400 mg total) by mouth every other day.     metoprolol  succinate (TOPROL -XL) 100 MG 24 hr tablet Take 1 tablet (100 mg total) by mouth daily. Take with or immediately following a meal. (Patient taking differently: Take 100 mg by mouth daily. 1/2 tab Mon, Wed and Fri) 90 tablet 3   nitroGLYCERIN  (NITROSTAT ) 0.4 MG SL tablet DISSOLVE 1 TABLET UNDER THE TONGUE EVERY 5 MINUTES AS NEEDED FOR CHEST PAIN FOR A MAXIMUM OF 3 DOSES 25 tablet 11   polyethylene glycol (MIRALAX  / GLYCOLAX ) 17 g packet Take 17 g by mouth daily.     Potassium Gluconate 595 MG CAPS Take 595 mg by mouth in the morning.     ranolazine  (RANEXA ) 500 MG 12 hr tablet Take 250 mg by mouth daily.     rosuvastatin  (CRESTOR ) 10 MG tablet TAKE 1 TABLET DAILY 90 tablet 3   torsemide  (DEMADEX ) 20 MG tablet Take 1 tab daily in the AM if weight is increasing.     No current facility-administered medications on file prior to visit.    Allergies  Allergen Reactions   Brilinta  [Ticagrelor ] Other (See Comments)    bleeding   Nsaids Other (See Comments)  CKD    Past Medical History:  Diagnosis Date   Age-related macular degeneration, wet, both eyes ([name])    Anemia    Secondary to acute blood loss   Anginal pain ([name])    last chest pain in Feb 2021   Arthritis    mild in hands, knees, ankles (11/13/2015)   Atrial fibrillation ([name])    Consideration was given for atrial flutter ablation, but patient developed atrial fibrillation. Cardioversion was done. Dr. Fernande decided to watch him clinically. November, 2011   Atrial flutter [facility]) 07/2010   September, 2011   Hospital with PNA and cath done...Coumadin .  Atrial flutter ablation planned, but  pt. then had atrial fibrillation,/outpatient conversion 09/08/10..NSR..plan to follow..Dr. Fernande   CAD (coronary artery disease)    Catheterization, September, 2011,  grafts patent from redo CABG,, medical therapy of  coronary disease, consideration to proceeding with atrial flutter ablation   Carotid artery disease ([name])    Doppler 09/18/2009 - 49% bilateral stenoses   Carotid artery disease ([name])    49% bilateral, Doppler, November, 2010   CHF (congestive heart failure) ([name])    Chronic kidney disease (CKD), stage III (moderate) ([name])    Diabetic peripheral neuropathy ([name])    feet   Diverticulosis of colon with hemorrhage 2009   several unit diverticular bleed    Erectile dysfunction    Mild   Gout    Hearing loss    wears hearing aids   History of blood transfusion several   related to diverticular bleeding   Hyperlipidemia    Hypertension    Mitral regurgitation    Mild, echo, September, 2011   NSTEMI (non-ST elevated myocardial infarction) ([name]) 07/2010   at [facility] with repeat cath, rec medical mgmt    OSA on CPAP    uses CPAP nightly   Personal history of colonic polyps    PNA (pneumonia) 07/2010   NSTEMI at [facility] with repeat cath, rec medical mgmt    RBBB (right bundle branch block)    S/P mitral valve repair 02/06/2020   s/p TEER with a single MitraClip NTW placed on A2/P2   Shoulder pain    positional; better now (11/13/2015)   Skin cancer    R lower leg, per derm 2012   Type II diabetes mellitus ([name])     Past Surgical History:  Procedure Laterality Date   CARDIAC CATHETERIZATION  2006   Nuclear..slight lateral ischemia..medical therapy   CARDIAC CATHETERIZATION  08/04/2010   grafts patent from redo CABG...medical Rx and ablate Atrial flutter (LV not injected)    CARDIOVERSION  ~ 2010   CAROTID ENDARTERECTOMY Right 1994   CATARACT EXTRACTION W/ INTRAOCULAR LENS  IMPLANT, BILATERAL Bilateral    CHOLECYSTECTOMY     COLON RESECTION N/A 01/13/2016   Procedure: EXPLORATORY LAPAROTOMY, LEFT AND SIGMOID COLON REMOVAL;  Surgeon: Lynda Leos, MD;  Location: [facility] OR;  Service: General;  Laterality: N/A;  Extended open left hemicolectomy and sigmoidectomy    COLONOSCOPY N/A  11/14/2015   Procedure: COLONOSCOPY;  Surgeon: Elspeth Deward Naval, MD;  Location: [facility] ENDOSCOPY;  Service: Gastroenterology;  Laterality: N/A;   COLONOSCOPY Left 01/05/2016   Procedure: COLONOSCOPY;  Surgeon: Elspeth Deward Naval, MD;  Location: [facility] ENDOSCOPY;  Service: Gastroenterology;  Laterality: Left;  no sedation to start, moderate if needed   COLONOSCOPY W/ POLYPECTOMY     CORONARY ARTERY BYPASS GRAFT  1995; 2006   X 3; X3   CORONARY ARTERY BYPASS GRAFT  1995, 2006  CORONARY STENT INTERVENTION N/A 02/02/2024   Procedure: CORONARY STENT INTERVENTION;  Surgeon: Wonda Sharper, MD;  Location: [facility] INVASIVE CV LAB;  Service: Cardiovascular;  Laterality: N/A;   DOPPLER ECHOCARDIOGRAPHY  08/2008   EF 60%   DOPPLER ECHOCARDIOGRAPHY  08/02/2010   65-70%   DOPPLER ECHOCARDIOGRAPHY  07/2010   MR mild   ESOPHAGOGASTRODUODENOSCOPY N/A 06/19/2014   Procedure: ESOPHAGOGASTRODUODENOSCOPY (EGD);  Surgeon: Gordy CHRISTELLA Starch, MD;  Location: [facility] ENDOSCOPY;  Service: Endoscopy;  Laterality: N/A;   ESOPHAGOGASTRODUODENOSCOPY N/A 11/14/2015   Procedure: ESOPHAGOGASTRODUODENOSCOPY (EGD);  Surgeon: Elspeth Deward Naval, MD;  Location: [facility] ENDOSCOPY;  Service: Gastroenterology;  Laterality: N/A;   ESOPHAGOGASTRODUODENOSCOPY (EGD) WITH PROPOFOL  N/A 08/18/2023   Procedure: ESOPHAGOGASTRODUODENOSCOPY (EGD) WITH PROPOFOL ;  Surgeon: San Sandor GAILS, DO;  Location: [facility] ENDOSCOPY;  Service: Gastroenterology;  Laterality: N/A;   FLEXIBLE SIGMOIDOSCOPY N/A 11/16/2015   Procedure: FLEXIBLE SIGMOIDOSCOPY;  Surgeon: Elspeth Deward Naval, MD;  Location: [facility] ENDOSCOPY;  Service: Gastroenterology;  Laterality: N/A;   LAPAROSCOPIC CHOLECYSTECTOMY  2008   LEFT HEART CATH N/A 06/14/2014   Procedure: LEFT HEART CATH;  Surgeon: Victory LELON Claudene DOUGLAS, MD;  Location[facility] CATH LAB;  Service: Cardiovascular;  Laterality: N/A;   LEFT HEART CATH AND CORONARY ANGIOGRAPHY N/A 08/02/2018   Procedure: LEFT HEART CATH AND CORONARY  ANGIOGRAPHY;  Surgeon: Court Dorn PARAS, MD;  Location: [facility] INVASIVE CV LAB;  Service: Cardiovascular;  Laterality: N/A;   LEFT HEART CATHETERIZATION WITH CORONARY/GRAFT ANGIOGRAM N/A 06/11/2014   Procedure: LEFT HEART CATHETERIZATION WITH EL BILE;  Surgeon: Victory LELON Claudene DOUGLAS, MD;  Location: [facility] CATH LAB;  Service: Cardiovascular;  Laterality: N/A;   PERCUTANEOUS CORONARY STENT INTERVENTION (PCI-S) N/A 06/13/2014   Procedure: PERCUTANEOUS CORONARY STENT INTERVENTION (PCI-S);  Surgeon: Victory LELON Claudene DOUGLAS, MD;  Location: [facility] CATH LAB;  Service: Cardiovascular;  Laterality: N/A;   RIGHT HEART CATH N/A 01/06/2020   Procedure: RIGHT HEART CATH;  Surgeon: Cherrie Toribio SAUNDERS, MD;  Location: [facility] INVASIVE CV LAB;  Service: Cardiovascular;  Laterality: N/A;   RIGHT/LEFT HEART CATH AND CORONARY/GRAFT ANGIOGRAPHY N/A 02/02/2024   Procedure: RIGHT/LEFT HEART CATH AND CORONARY/GRAFT ANGIOGRAPHY;  Surgeon: Wonda Sharper, MD;  Location: [facility] INVASIVE CV LAB;  Service: Cardiovascular;  Laterality: N/A;   SKIN CANCER EXCISION Left 10/2015   calf   SKIN CANCER EXCISION Right 2014?   chest   TEE WITHOUT CARDIOVERSION N/A 01/06/2020   Procedure: TRANSESOPHAGEAL ECHOCARDIOGRAM (TEE);  Surgeon: Cherrie Toribio SAUNDERS, MD;  Location: [facility] ENDOSCOPY;  Service: Cardiovascular;  Laterality: N/A;   TONSILLECTOMY     TRANSCATHETER MITRAL EDGE TO EDGE REPAIR N/A 02/06/2020   Procedure: MITRAL VALVE REPAIR;  Surgeon: Wonda Sharper, MD;  Location: [facility] INVASIVE CV LAB;  Service: Cardiovascular;  Laterality: N/A;   VASECTOMY      Family History  Problem Relation Age of Onset   Kidney disease Mother        Kidney failure   Stroke Mother    Diabetes Mother    Heart disease Father        MI   Arthritis Sister    Cancer Sister        Throat   Heart disease Sister        MI   Diabetes Sister    Prostate cancer Neg Hx    Colon cancer Neg Hx     Social History   Socioeconomic History   Marital status:  Widowed    Spouse name: Not on file   Number of children: 2  Years of education: Not on file   Highest education level: Not on file  Occupational History   Occupation: Retired Surveyor, minerals. Rep. Equities trader: RETIRED  Tobacco Use   Smoking status: Former    Current packs/day: 0.00    Average packs/day: 1 pack/day for 8.0 years (8.0 ttl pk-yrs)    Types: Cigarettes    Start date: 78    Quit date: 1958    Years since quitting: 67.5   Smokeless tobacco: Never   Tobacco comments:    quit smoking cigarettes in 1958  Vaping Use   Vaping status: Never Used  Substance and Sexual Activity   Alcohol use: No    Alcohol/week: 0.0 standard drinks of alcohol   Drug use: No   Sexual activity: Yes  Other Topics Concern   Not on file  Social History Narrative   From [place].  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     Lives alone.    Social Drivers of Corporate investment banker Strain: Low Risk  (10/31/2023)   Overall Financial Resource Strain (CARDIA)    Difficulty of Paying Living Expenses: Not hard at all  Food Insecurity: No Food Insecurity (04/13/2024)   Hunger Vital Sign    Worried About Running Out of Food in the Last Year: Never true    Ran Out of Food in the Last Year: Never true  Transportation Needs: No Transportation Needs (04/13/2024)   PRAPARE - Administrator, Civil Service (Medical): No    Lack of Transportation (Non-Medical): No  Physical Activity: Inactive (10/31/2023)   Exercise Vital Sign    Days of Exercise per Week: 0 days    Minutes of Exercise per Session: 0 min  Stress: No Stress Concern Present (10/31/2023)   [employer] of Occupational Health - Occupational Stress Questionnaire    Feeling of Stress : Not at all  Social Connections: Moderately Isolated (04/13/2024)   Social Connection and Isolation Panel    Frequency of Communication with Friends and Family: More than three times a week    Frequency  of Social Gatherings with Friends and Family: More than three times a week    Attends Religious Services: Never    Database administrator or Organizations: Yes    Attends Banker Meetings: 1 to 4 times per year    Marital Status: Widowed  Intimate Partner Violence: Not At Risk (04/13/2024)   Humiliation, Afraid, Rape, and Kick questionnaire    Fear of Current or Ex-Partner: No    Emotionally Abused: No    Physically Abused: No    Sexually Abused: No   Review of Systems Seldomuses salt--sounds like he does use it No SOB    Objective:   Physical Exam Cardiovascular:     Rate and Rhythm: Normal rate and regular rhythm.     Heart sounds:     No gallop.     Comments: Soft systolic murmur Pulmonary:     Effort: Pulmonary effort is normal.     Breath sounds: No wheezing or rales.     Comments: Dull and decreased breath sounds on right Musculoskeletal:     Comments: Mild edema in legs---markedly better with wrap and elevation from pictures yesterday  Skin:    Comments: Several small ulcerations without marked inflammation Right great toe with mildly inflamed ulcer Redness and puffiness of dorsum of foot  Assessment & Plan:

## 2024-05-22 NOTE — Assessment & Plan Note (Signed)
 Improved now after wraps (ACE wraps reapplied) Early infection--but looks better today Doxy 100  bid x 7 days Discussed with Amy--home care RN via phone at visit

## 2024-05-22 NOTE — Assessment & Plan Note (Signed)
 Following with nephrologist No ACEI/ARB due to this

## 2024-05-23 DIAGNOSIS — N184 Chronic kidney disease, stage 4 (severe): Secondary | ICD-10-CM | POA: Diagnosis not present

## 2024-05-23 DIAGNOSIS — I130 Hypertensive heart and chronic kidney disease with heart failure and stage 1 through stage 4 chronic kidney disease, or unspecified chronic kidney disease: Secondary | ICD-10-CM | POA: Diagnosis not present

## 2024-05-23 DIAGNOSIS — I5023 Acute on chronic systolic (congestive) heart failure: Secondary | ICD-10-CM | POA: Diagnosis not present

## 2024-05-23 DIAGNOSIS — E1122 Type 2 diabetes mellitus with diabetic chronic kidney disease: Secondary | ICD-10-CM | POA: Diagnosis not present

## 2024-05-23 DIAGNOSIS — I5032 Chronic diastolic (congestive) heart failure: Secondary | ICD-10-CM | POA: Diagnosis not present

## 2024-05-23 DIAGNOSIS — E1151 Type 2 diabetes mellitus with diabetic peripheral angiopathy without gangrene: Secondary | ICD-10-CM | POA: Diagnosis not present

## 2024-05-27 ENCOUNTER — Ambulatory Visit (INDEPENDENT_AMBULATORY_CARE_PROVIDER_SITE_OTHER): Admitting: Family Medicine

## 2024-05-27 ENCOUNTER — Encounter: Payer: Self-pay | Admitting: Family Medicine

## 2024-05-27 ENCOUNTER — Inpatient Hospital Stay: Admitting: Family Medicine

## 2024-05-27 VITALS — BP 118/58 | HR 58 | Temp 97.5°F | Ht 67.0 in | Wt 160.2 lb

## 2024-05-27 DIAGNOSIS — Z7901 Long term (current) use of anticoagulants: Secondary | ICD-10-CM | POA: Diagnosis not present

## 2024-05-27 DIAGNOSIS — L97509 Non-pressure chronic ulcer of other part of unspecified foot with unspecified severity: Secondary | ICD-10-CM

## 2024-05-27 DIAGNOSIS — I5023 Acute on chronic systolic (congestive) heart failure: Secondary | ICD-10-CM | POA: Diagnosis not present

## 2024-05-27 DIAGNOSIS — E1140 Type 2 diabetes mellitus with diabetic neuropathy, unspecified: Secondary | ICD-10-CM | POA: Diagnosis not present

## 2024-05-27 DIAGNOSIS — Z7984 Long term (current) use of oral hypoglycemic drugs: Secondary | ICD-10-CM | POA: Diagnosis not present

## 2024-05-27 DIAGNOSIS — I130 Hypertensive heart and chronic kidney disease with heart failure and stage 1 through stage 4 chronic kidney disease, or unspecified chronic kidney disease: Secondary | ICD-10-CM | POA: Diagnosis not present

## 2024-05-27 DIAGNOSIS — I5032 Chronic diastolic (congestive) heart failure: Secondary | ICD-10-CM | POA: Diagnosis not present

## 2024-05-27 DIAGNOSIS — E1122 Type 2 diabetes mellitus with diabetic chronic kidney disease: Secondary | ICD-10-CM | POA: Diagnosis not present

## 2024-05-27 DIAGNOSIS — E1149 Type 2 diabetes mellitus with other diabetic neurological complication: Secondary | ICD-10-CM

## 2024-05-27 DIAGNOSIS — E1151 Type 2 diabetes mellitus with diabetic peripheral angiopathy without gangrene: Secondary | ICD-10-CM | POA: Diagnosis not present

## 2024-05-27 DIAGNOSIS — N184 Chronic kidney disease, stage 4 (severe): Secondary | ICD-10-CM | POA: Diagnosis not present

## 2024-05-27 LAB — CBC WITH DIFFERENTIAL/PLATELET
Basophils Absolute: 0 K/uL (ref 0.0–0.1)
Basophils Relative: 0.2 % (ref 0.0–3.0)
Eosinophils Absolute: 0.1 K/uL (ref 0.0–0.7)
Eosinophils Relative: 1.6 % (ref 0.0–5.0)
HCT: 32.4 % — ABNORMAL LOW (ref 39.0–52.0)
Hemoglobin: 10.5 g/dL — ABNORMAL LOW (ref 13.0–17.0)
Lymphocytes Relative: 12.8 % (ref 12.0–46.0)
Lymphs Abs: 1 K/uL (ref 0.7–4.0)
MCHC: 32.6 g/dL (ref 30.0–36.0)
MCV: 96.8 fl (ref 78.0–100.0)
Monocytes Absolute: 0.5 K/uL (ref 0.1–1.0)
Monocytes Relative: 6.2 % (ref 3.0–12.0)
Neutro Abs: 6 K/uL (ref 1.4–7.7)
Neutrophils Relative %: 79.2 % — ABNORMAL HIGH (ref 43.0–77.0)
Platelets: 207 K/uL (ref 150.0–400.0)
RBC: 3.35 Mil/uL — ABNORMAL LOW (ref 4.22–5.81)
RDW: 16.5 % — ABNORMAL HIGH (ref 11.5–15.5)
WBC: 7.6 K/uL (ref 4.0–10.5)

## 2024-05-27 LAB — COMPREHENSIVE METABOLIC PANEL WITH GFR
ALT: 19 U/L (ref 0–53)
AST: 12 U/L (ref 0–37)
Albumin: 3.7 g/dL (ref 3.5–5.2)
Alkaline Phosphatase: 143 U/L — ABNORMAL HIGH (ref 39–117)
BUN: 43 mg/dL — ABNORMAL HIGH (ref 6–23)
CO2: 28 meq/L (ref 19–32)
Calcium: 9.5 mg/dL (ref 8.4–10.5)
Chloride: 99 meq/L (ref 96–112)
Creatinine, Ser: 2.65 mg/dL — ABNORMAL HIGH (ref 0.40–1.50)
GFR: 20.61 mL/min — ABNORMAL LOW (ref >60.00)
Glucose, Bld: 308 mg/dL — ABNORMAL HIGH (ref 70–99)
Potassium: 4.5 meq/L (ref 3.5–5.1)
Sodium: 137 meq/L (ref 135–145)
Total Bilirubin: 0.7 mg/dL (ref 0.2–1.2)
Total Protein: 6.8 g/dL (ref 6.0–8.3)

## 2024-05-27 LAB — HEMOGLOBIN A1C: Hgb A1c MFr Bld: 10.2 % — ABNORMAL HIGH (ref 4.6–6.5)

## 2024-05-27 MED ORDER — METOPROLOL SUCCINATE ER 100 MG PO TB24: 50.0000 mg | ORAL_TABLET | ORAL | Status: DC

## 2024-05-27 MED ORDER — RANOLAZINE ER 500 MG PO TB12: 500.0000 mg | ORAL_TABLET | Freq: Two times a day (BID) | ORAL | Status: AC

## 2024-05-27 MED ORDER — TORSEMIDE 20 MG PO TABS: 20.0000 mg | ORAL_TABLET | Freq: Two times a day (BID) | ORAL | Status: AC

## 2024-05-27 NOTE — Progress Notes (Unsigned)
 Inpatient course/recent events discussed with patient.  See below. ------------------------------  89 y.o. male with medical history significant for A-fib on Eliquis , CAD/NSTEMI s/p CABG, CKD 4, anemia, HLD,Carotid art dis, PVD  HTN, gout, GERD, OSA on CPAP, mitral regurg s/p MVR, HFrEF and T2DM who presents to the ED valuation after a fall while making oatmeal in the kitchen landing on the right shoulder, no loss of consciousness. In the ED hemodynamically stable,blood glucose 248, BUN/creatinine 39/3.28, WBC 6.1, Hgb 9.3, platelet 141.  EKG shows A-fib with a rate of 65 and RBBB.  Trauma scan reveals nondisplaced fracture to the right coracoid process with nondisplaced fractures of the posterior right 10th rib and anterior right 7th and 8th ribs with moderate volume right pleural effusion, A right shoulder immobilizer/sling was placed.  Trauma surgery was consulted and admitted. Managing with sling for his right coracoid process fracture, pain control on his rib fracture with multimodal management. Weak and deconditioned and recommending skilled nursing facility.  Waiting for placement Overall patient remains hemodynamically stable waiting for placement  =========  Discharge Diagnoses:   Mechanical fall at home Nondisplaced fracture of right coracoid process Right rib fractures 7, 8, 10 Chronic looking right pleural effusion: Trauma surgery input appreciated. Patient pain controlled on RA. Continue with multimodal pain control pulmonary toileting PT OT Cont sling Rt arm as per [facility] as OP Per trauma resumed DOAC 04/13/24 doing ok.    Acute on chronic systolic heart failure Pleural effusions chronic appearing: Presenting with lower extremity swelling and fatigue with ambulation but no significant shortness of breath,BNP elevated in 3000  CXR with moderate R pleural effusion managed with IV Lasix , home metoprolol  Overall volume status improved, now on PO home  torsemide     PVD Patient with cool extremities and nonpalpable but dopplerable pedal pulses W/ small superficial ulcer on the right big toe Endorsed weakness in lower extremity with ambulation but no pain. On statins eliquis  and statins. ABI completed right with mild right lower extremity arterial disease left also mild, unable to obtain TBI due to grade 2 ulceration.   AKI on CKD vs ckd-progressive CKD: creatinine has been  in low 3s. Patient tolerating diuresis continue torsemide  monitor renal function as outpatient follow-up with PCP/nephrology He is likely at new baseline   Persistent A-fib: HR stable, Continue Toprol -XL and resuming Eliqui   T2DM: Last  A1c 6.4% 2 months ago.  Continue sliding scale insulin , Jardiance   =================== Here today with family. History of fall with fractures as above.  Anticoagulated with Eliquis  but off aspirin  due to history of A-fib.Not lightheaded, not SOB. Had still been on ranexa  and torsemide .  Still on doxycycline , with recent eval for venous stasis/cellulitis. Due for A1c.  On jardiance .    Has home health set up. Med list reviewed. Labs pending.  Now living with family, discharge from facility.  He still has leg wounds that are dressed with Ace wrap.  See exam.  No fevers.  Meds, vitals, and allergies reviewed.   ROS: Per HPI unless specifically indicated in ROS section   [place] Elderly man sitting in clinic in no distress MMM Neck supple, no LA Rrr Ctab with no focal decrease in breath sounds Abd soft, nontender.  Normal bowel sounds. 1+ BLE edema.  Right leg with a 1 x 1 cm superficial open area on the shin.  Right foot Toe with an ulcer on the plantar side.  He had a Band-Aid and a dressing over that and the local skin  appears macerated but there is no discharge. Both were covered with nonstick bandage.  Extra gauze was used around the toe.  No tape was applied to the skin. Right foot/lower leg then wrapped with Ace wrap Left foot  and shin inspected with Ace wrap reapplied.  40 minutes were devoted to patient care in this encounter (this includes time spent reviewing the patient's file/history, interviewing and examining the patient, counseling/reviewing plan with patient).

## 2024-05-27 NOTE — Patient Instructions (Addendum)
 Check your med list at home.  Update me as needed.  Go to the lab on the way out.   If you have mychart we'll likely use that to update you.    Take care.  Glad to see you. Let me know about your situation- swelling and your foot- in about 2 days.  Refer to wound clinic- let me know if you can't get set up soon.

## 2024-05-28 DIAGNOSIS — E1122 Type 2 diabetes mellitus with diabetic chronic kidney disease: Secondary | ICD-10-CM | POA: Diagnosis not present

## 2024-05-28 DIAGNOSIS — N184 Chronic kidney disease, stage 4 (severe): Secondary | ICD-10-CM | POA: Diagnosis not present

## 2024-05-28 DIAGNOSIS — I5023 Acute on chronic systolic (congestive) heart failure: Secondary | ICD-10-CM | POA: Diagnosis not present

## 2024-05-28 DIAGNOSIS — E1151 Type 2 diabetes mellitus with diabetic peripheral angiopathy without gangrene: Secondary | ICD-10-CM | POA: Diagnosis not present

## 2024-05-28 DIAGNOSIS — I130 Hypertensive heart and chronic kidney disease with heart failure and stage 1 through stage 4 chronic kidney disease, or unspecified chronic kidney disease: Secondary | ICD-10-CM | POA: Diagnosis not present

## 2024-05-28 DIAGNOSIS — I5032 Chronic diastolic (congestive) heart failure: Secondary | ICD-10-CM | POA: Diagnosis not present

## 2024-05-29 ENCOUNTER — Ambulatory Visit: Payer: Self-pay | Admitting: Family Medicine

## 2024-05-29 ENCOUNTER — Telehealth: Payer: Self-pay | Admitting: *Deleted

## 2024-05-29 DIAGNOSIS — L97509 Non-pressure chronic ulcer of other part of unspecified foot with unspecified severity: Secondary | ICD-10-CM | POA: Insufficient documentation

## 2024-05-29 NOTE — Assessment & Plan Note (Signed)
 See toe ulcer discussion.  See notes on labs.  Continue Jardiance .

## 2024-05-29 NOTE — Telephone Encounter (Signed)
 Copied from CRM [id number]. Topic: Clinical - Home Health Verbal Orders >> May 29, 2024  4:15 PM Delon DASEN wrote: Caller/Agency: Amy with [facility] Callback Number: [phone] Service Requested: Skilled Nursing Frequency: 2x week for 2 wks, 1 x week for 7 weeks Any new concerns about the patient? Yes- blood sugars running high- yesterday 224,  7/14  284,  7/13  216- not going below 216

## 2024-05-29 NOTE — Assessment & Plan Note (Signed)
 Continue Eliquis  for now.  See notes on follow-up labs.

## 2024-05-29 NOTE — Assessment & Plan Note (Signed)
 Continue doxycycline , toe dressed as above with nonstick bandage and roll gauze and then covered with Ace wrap.  Refer to wound clinic.  Elevate in the meantime.  I asked him to let me know about his situation- swelling and wound/foot- in about 2 days.

## 2024-05-30 DIAGNOSIS — E1151 Type 2 diabetes mellitus with diabetic peripheral angiopathy without gangrene: Secondary | ICD-10-CM | POA: Diagnosis not present

## 2024-05-30 DIAGNOSIS — N184 Chronic kidney disease, stage 4 (severe): Secondary | ICD-10-CM | POA: Diagnosis not present

## 2024-05-30 DIAGNOSIS — I130 Hypertensive heart and chronic kidney disease with heart failure and stage 1 through stage 4 chronic kidney disease, or unspecified chronic kidney disease: Secondary | ICD-10-CM | POA: Diagnosis not present

## 2024-05-30 DIAGNOSIS — E1122 Type 2 diabetes mellitus with diabetic chronic kidney disease: Secondary | ICD-10-CM | POA: Diagnosis not present

## 2024-05-30 DIAGNOSIS — I5023 Acute on chronic systolic (congestive) heart failure: Secondary | ICD-10-CM | POA: Diagnosis not present

## 2024-05-30 DIAGNOSIS — I5032 Chronic diastolic (congestive) heart failure: Secondary | ICD-10-CM | POA: Diagnosis not present

## 2024-05-30 NOTE — Telephone Encounter (Signed)
 Please give the order.   See result note and see if patient is willing to to start daily insulin  to help with sugar control.  Please let me know.  Thanks.

## 2024-05-31 DIAGNOSIS — L97512 Non-pressure chronic ulcer of other part of right foot with fat layer exposed: Secondary | ICD-10-CM | POA: Diagnosis not present

## 2024-05-31 DIAGNOSIS — E11621 Type 2 diabetes mellitus with foot ulcer: Secondary | ICD-10-CM | POA: Diagnosis not present

## 2024-05-31 DIAGNOSIS — L97811 Non-pressure chronic ulcer of other part of right lower leg limited to breakdown of skin: Secondary | ICD-10-CM | POA: Diagnosis not present

## 2024-05-31 DIAGNOSIS — E1151 Type 2 diabetes mellitus with diabetic peripheral angiopathy without gangrene: Secondary | ICD-10-CM | POA: Diagnosis not present

## 2024-05-31 DIAGNOSIS — L97821 Non-pressure chronic ulcer of other part of left lower leg limited to breakdown of skin: Secondary | ICD-10-CM | POA: Diagnosis not present

## 2024-05-31 DIAGNOSIS — I5032 Chronic diastolic (congestive) heart failure: Secondary | ICD-10-CM | POA: Diagnosis not present

## 2024-05-31 DIAGNOSIS — I130 Hypertensive heart and chronic kidney disease with heart failure and stage 1 through stage 4 chronic kidney disease, or unspecified chronic kidney disease: Secondary | ICD-10-CM | POA: Diagnosis not present

## 2024-05-31 DIAGNOSIS — I4821 Permanent atrial fibrillation: Secondary | ICD-10-CM | POA: Diagnosis not present

## 2024-05-31 DIAGNOSIS — N184 Chronic kidney disease, stage 4 (severe): Secondary | ICD-10-CM | POA: Diagnosis not present

## 2024-05-31 DIAGNOSIS — E1122 Type 2 diabetes mellitus with diabetic chronic kidney disease: Secondary | ICD-10-CM | POA: Diagnosis not present

## 2024-05-31 DIAGNOSIS — I5023 Acute on chronic systolic (congestive) heart failure: Secondary | ICD-10-CM | POA: Diagnosis not present

## 2024-05-31 DIAGNOSIS — I872 Venous insufficiency (chronic) (peripheral): Secondary | ICD-10-CM | POA: Diagnosis not present

## 2024-05-31 NOTE — Telephone Encounter (Signed)
 I would not use unna boots given his h/o PVD but I would continue with elevation.  I would continue with dressings as is, given the situation.   Is he willing to start insulin ?  If so, is HH willing to to do insulin  teaching?    If yes to both, would start lantus  5 units daily and increase by 1 unit per day as long as AM sugar is above 150.  If AM sugar 100-150, no change in daily dose.  If AM sugar below 100, then would dec lantus  dose by 1 unit.    Please let me know.  Thanks.

## 2024-05-31 NOTE — Telephone Encounter (Signed)
 Amy call back number: [phone]   Skilled nursing orders given.    Per Amy pt no longer has weeping edema to bilateral legs.  She is requesting orders to put unna boots on legs twice weekly to control edema and keep dressings done by family to a minimum.  Pt is now sitting in lift chair with legs elevated more during the day which has improved his edema.  Appointment with wound center on 06/25/24. Right great toe would still need to be dressed daily by family.    Fasting blood sugars: 7/7 349 7/8 301 7/9 298 7/10 280 7/11 226 7/12 225 7/13 216 7/14 284 7/15 224  Only spoke with nurse Amy, have not discussed any medication changes. Do you want the patient scheduled for an appointment to discuss insulin  or home health nurse to do insulin  teaching?

## 2024-06-03 ENCOUNTER — Other Ambulatory Visit: Payer: Self-pay

## 2024-06-03 DIAGNOSIS — I5023 Acute on chronic systolic (congestive) heart failure: Secondary | ICD-10-CM | POA: Diagnosis not present

## 2024-06-03 DIAGNOSIS — E1122 Type 2 diabetes mellitus with diabetic chronic kidney disease: Secondary | ICD-10-CM | POA: Diagnosis not present

## 2024-06-03 DIAGNOSIS — N184 Chronic kidney disease, stage 4 (severe): Secondary | ICD-10-CM | POA: Diagnosis not present

## 2024-06-03 DIAGNOSIS — I130 Hypertensive heart and chronic kidney disease with heart failure and stage 1 through stage 4 chronic kidney disease, or unspecified chronic kidney disease: Secondary | ICD-10-CM | POA: Diagnosis not present

## 2024-06-03 DIAGNOSIS — I5032 Chronic diastolic (congestive) heart failure: Secondary | ICD-10-CM | POA: Diagnosis not present

## 2024-06-03 DIAGNOSIS — E1151 Type 2 diabetes mellitus with diabetic peripheral angiopathy without gangrene: Secondary | ICD-10-CM | POA: Diagnosis not present

## 2024-06-03 MED ORDER — LANTUS SOLOSTAR 100 UNIT/ML ~~LOC~~ SOPN: 5.0000 [IU] | PEN_INJECTOR | Freq: Every day | SUBCUTANEOUS | 0 refills | Status: DC

## 2024-06-03 NOTE — Telephone Encounter (Signed)
 Spoke with [facility] and advied of Dr Cleatus notes. They will do the insulin  education Sent prescription to pharmacy

## 2024-06-04 DIAGNOSIS — N184 Chronic kidney disease, stage 4 (severe): Secondary | ICD-10-CM | POA: Diagnosis not present

## 2024-06-04 DIAGNOSIS — I5023 Acute on chronic systolic (congestive) heart failure: Secondary | ICD-10-CM | POA: Diagnosis not present

## 2024-06-04 DIAGNOSIS — E1122 Type 2 diabetes mellitus with diabetic chronic kidney disease: Secondary | ICD-10-CM | POA: Diagnosis not present

## 2024-06-04 DIAGNOSIS — E1151 Type 2 diabetes mellitus with diabetic peripheral angiopathy without gangrene: Secondary | ICD-10-CM | POA: Diagnosis not present

## 2024-06-04 DIAGNOSIS — I130 Hypertensive heart and chronic kidney disease with heart failure and stage 1 through stage 4 chronic kidney disease, or unspecified chronic kidney disease: Secondary | ICD-10-CM | POA: Diagnosis not present

## 2024-06-04 DIAGNOSIS — I5032 Chronic diastolic (congestive) heart failure: Secondary | ICD-10-CM | POA: Diagnosis not present

## 2024-06-06 ENCOUNTER — Telehealth: Payer: Self-pay

## 2024-06-06 DIAGNOSIS — E1122 Type 2 diabetes mellitus with diabetic chronic kidney disease: Secondary | ICD-10-CM | POA: Diagnosis not present

## 2024-06-06 DIAGNOSIS — N184 Chronic kidney disease, stage 4 (severe): Secondary | ICD-10-CM | POA: Diagnosis not present

## 2024-06-06 DIAGNOSIS — I5032 Chronic diastolic (congestive) heart failure: Secondary | ICD-10-CM | POA: Diagnosis not present

## 2024-06-06 DIAGNOSIS — I5023 Acute on chronic systolic (congestive) heart failure: Secondary | ICD-10-CM | POA: Diagnosis not present

## 2024-06-06 DIAGNOSIS — I130 Hypertensive heart and chronic kidney disease with heart failure and stage 1 through stage 4 chronic kidney disease, or unspecified chronic kidney disease: Secondary | ICD-10-CM | POA: Diagnosis not present

## 2024-06-06 DIAGNOSIS — E1151 Type 2 diabetes mellitus with diabetic peripheral angiopathy without gangrene: Secondary | ICD-10-CM | POA: Diagnosis not present

## 2024-06-06 NOTE — Telephone Encounter (Signed)
 Copied from CRM [id number]. Topic: General - Other >> Jun 05, 2024  9:37 AM Turkey A wrote: Reason for CRM: Greig Gavel with [facility] is calling [phone]. Nurse found a pressure ulcer on bottom, patient also needs prescription for needles for Lantus 

## 2024-06-09 MED ORDER — INSULIN PEN NEEDLE 29G X 5MM MISC: 3 refills | Status: AC

## 2024-06-09 NOTE — Telephone Encounter (Signed)
 Please get the order to eval and treat for pressure sore.  I sent the prescription for the pen needles.  Thanks.

## 2024-06-09 NOTE — Addendum Note (Signed)
 Addended by: CLEATUS ARLYSS RAMAN on: 06/09/2024 04:29 PM   Modules accepted: Orders

## 2024-06-10 ENCOUNTER — Telehealth: Payer: Self-pay | Admitting: *Deleted

## 2024-06-10 DIAGNOSIS — I5032 Chronic diastolic (congestive) heart failure: Secondary | ICD-10-CM | POA: Diagnosis not present

## 2024-06-10 DIAGNOSIS — E1151 Type 2 diabetes mellitus with diabetic peripheral angiopathy without gangrene: Secondary | ICD-10-CM | POA: Diagnosis not present

## 2024-06-10 DIAGNOSIS — I130 Hypertensive heart and chronic kidney disease with heart failure and stage 1 through stage 4 chronic kidney disease, or unspecified chronic kidney disease: Secondary | ICD-10-CM | POA: Diagnosis not present

## 2024-06-10 DIAGNOSIS — N184 Chronic kidney disease, stage 4 (severe): Secondary | ICD-10-CM | POA: Diagnosis not present

## 2024-06-10 DIAGNOSIS — I5023 Acute on chronic systolic (congestive) heart failure: Secondary | ICD-10-CM | POA: Diagnosis not present

## 2024-06-10 DIAGNOSIS — E1122 Type 2 diabetes mellitus with diabetic chronic kidney disease: Secondary | ICD-10-CM | POA: Diagnosis not present

## 2024-06-10 NOTE — Telephone Encounter (Signed)
 Copied from CRM [id number]. Topic: Clinical - Prescription Issue >> Jun 07, 2024  2:45 PM Thersia BROCKS wrote: Reason for CRM: Patient daughter Odetta,  called in regarding insulin  pen, stated that they didn't get any needles prescribed for the pen, also wanted to know if the lab work could be printed off and they can come and pick it up sometime next week

## 2024-06-10 NOTE — Telephone Encounter (Signed)
 Left message for [name] on secure line that the rx has been sent for the lantus  and that evaluation and treatment for the pressure ulcer is okay by the provider

## 2024-06-12 NOTE — Telephone Encounter (Signed)
 Left message for patients daughter to return call.

## 2024-06-14 NOTE — Telephone Encounter (Signed)
 Per pt's chart, insulin  pen needle rx sent 06/09/24, #100/3 refills to [facility].   Spoke with pt relaying info above. States they picked up pen needles. I also notified him Patty requested copy lab results and that it's ready for them to pick up. Pt expresses his thanks.   [Placed copy of lab results at front office.]

## 2024-06-16 DIAGNOSIS — E1151 Type 2 diabetes mellitus with diabetic peripheral angiopathy without gangrene: Secondary | ICD-10-CM | POA: Diagnosis not present

## 2024-06-16 DIAGNOSIS — I130 Hypertensive heart and chronic kidney disease with heart failure and stage 1 through stage 4 chronic kidney disease, or unspecified chronic kidney disease: Secondary | ICD-10-CM | POA: Diagnosis not present

## 2024-06-16 DIAGNOSIS — I5023 Acute on chronic systolic (congestive) heart failure: Secondary | ICD-10-CM | POA: Diagnosis not present

## 2024-06-16 DIAGNOSIS — N184 Chronic kidney disease, stage 4 (severe): Secondary | ICD-10-CM | POA: Diagnosis not present

## 2024-06-16 DIAGNOSIS — E1122 Type 2 diabetes mellitus with diabetic chronic kidney disease: Secondary | ICD-10-CM | POA: Diagnosis not present

## 2024-06-16 DIAGNOSIS — I5032 Chronic diastolic (congestive) heart failure: Secondary | ICD-10-CM | POA: Diagnosis not present

## 2024-06-17 DIAGNOSIS — I5023 Acute on chronic systolic (congestive) heart failure: Secondary | ICD-10-CM | POA: Diagnosis not present

## 2024-06-17 DIAGNOSIS — I130 Hypertensive heart and chronic kidney disease with heart failure and stage 1 through stage 4 chronic kidney disease, or unspecified chronic kidney disease: Secondary | ICD-10-CM | POA: Diagnosis not present

## 2024-06-17 DIAGNOSIS — E1151 Type 2 diabetes mellitus with diabetic peripheral angiopathy without gangrene: Secondary | ICD-10-CM | POA: Diagnosis not present

## 2024-06-17 DIAGNOSIS — E1122 Type 2 diabetes mellitus with diabetic chronic kidney disease: Secondary | ICD-10-CM | POA: Diagnosis not present

## 2024-06-17 DIAGNOSIS — I5032 Chronic diastolic (congestive) heart failure: Secondary | ICD-10-CM | POA: Diagnosis not present

## 2024-06-17 DIAGNOSIS — N184 Chronic kidney disease, stage 4 (severe): Secondary | ICD-10-CM | POA: Diagnosis not present

## 2024-06-19 DIAGNOSIS — I130 Hypertensive heart and chronic kidney disease with heart failure and stage 1 through stage 4 chronic kidney disease, or unspecified chronic kidney disease: Secondary | ICD-10-CM | POA: Diagnosis not present

## 2024-06-19 DIAGNOSIS — I5032 Chronic diastolic (congestive) heart failure: Secondary | ICD-10-CM | POA: Diagnosis not present

## 2024-06-19 DIAGNOSIS — I5023 Acute on chronic systolic (congestive) heart failure: Secondary | ICD-10-CM | POA: Diagnosis not present

## 2024-06-19 DIAGNOSIS — E1151 Type 2 diabetes mellitus with diabetic peripheral angiopathy without gangrene: Secondary | ICD-10-CM | POA: Diagnosis not present

## 2024-06-19 DIAGNOSIS — E1122 Type 2 diabetes mellitus with diabetic chronic kidney disease: Secondary | ICD-10-CM | POA: Diagnosis not present

## 2024-06-19 DIAGNOSIS — N184 Chronic kidney disease, stage 4 (severe): Secondary | ICD-10-CM | POA: Diagnosis not present

## 2024-06-20 DIAGNOSIS — J918 Pleural effusion in other conditions classified elsewhere: Secondary | ICD-10-CM | POA: Diagnosis not present

## 2024-06-20 DIAGNOSIS — D631 Anemia in chronic kidney disease: Secondary | ICD-10-CM | POA: Diagnosis not present

## 2024-06-20 DIAGNOSIS — S2241XD Multiple fractures of ribs, right side, subsequent encounter for fracture with routine healing: Secondary | ICD-10-CM | POA: Diagnosis not present

## 2024-06-20 DIAGNOSIS — L97811 Non-pressure chronic ulcer of other part of right lower leg limited to breakdown of skin: Secondary | ICD-10-CM | POA: Diagnosis not present

## 2024-06-20 DIAGNOSIS — I4892 Unspecified atrial flutter: Secondary | ICD-10-CM | POA: Diagnosis not present

## 2024-06-20 DIAGNOSIS — L97821 Non-pressure chronic ulcer of other part of left lower leg limited to breakdown of skin: Secondary | ICD-10-CM | POA: Diagnosis not present

## 2024-06-20 DIAGNOSIS — I4821 Permanent atrial fibrillation: Secondary | ICD-10-CM | POA: Diagnosis not present

## 2024-06-20 DIAGNOSIS — K219 Gastro-esophageal reflux disease without esophagitis: Secondary | ICD-10-CM | POA: Diagnosis not present

## 2024-06-20 DIAGNOSIS — S52044D Nondisplaced fracture of coronoid process of right ulna, subsequent encounter for closed fracture with routine healing: Secondary | ICD-10-CM | POA: Diagnosis not present

## 2024-06-20 DIAGNOSIS — N179 Acute kidney failure, unspecified: Secondary | ICD-10-CM | POA: Diagnosis not present

## 2024-06-20 DIAGNOSIS — I5023 Acute on chronic systolic (congestive) heart failure: Secondary | ICD-10-CM | POA: Diagnosis not present

## 2024-06-20 DIAGNOSIS — N184 Chronic kidney disease, stage 4 (severe): Secondary | ICD-10-CM | POA: Diagnosis not present

## 2024-06-20 DIAGNOSIS — I130 Hypertensive heart and chronic kidney disease with heart failure and stage 1 through stage 4 chronic kidney disease, or unspecified chronic kidney disease: Secondary | ICD-10-CM | POA: Diagnosis not present

## 2024-06-20 DIAGNOSIS — M109 Gout, unspecified: Secondary | ICD-10-CM | POA: Diagnosis not present

## 2024-06-20 DIAGNOSIS — L97512 Non-pressure chronic ulcer of other part of right foot with fat layer exposed: Secondary | ICD-10-CM | POA: Diagnosis not present

## 2024-06-20 DIAGNOSIS — I872 Venous insufficiency (chronic) (peripheral): Secondary | ICD-10-CM | POA: Diagnosis not present

## 2024-06-20 DIAGNOSIS — E1151 Type 2 diabetes mellitus with diabetic peripheral angiopathy without gangrene: Secondary | ICD-10-CM | POA: Diagnosis not present

## 2024-06-20 DIAGNOSIS — I272 Pulmonary hypertension, unspecified: Secondary | ICD-10-CM | POA: Diagnosis not present

## 2024-06-20 DIAGNOSIS — I6529 Occlusion and stenosis of unspecified carotid artery: Secondary | ICD-10-CM | POA: Diagnosis not present

## 2024-06-20 DIAGNOSIS — I2510 Atherosclerotic heart disease of native coronary artery without angina pectoris: Secondary | ICD-10-CM | POA: Diagnosis not present

## 2024-06-20 DIAGNOSIS — I5032 Chronic diastolic (congestive) heart failure: Secondary | ICD-10-CM | POA: Diagnosis not present

## 2024-06-20 DIAGNOSIS — E785 Hyperlipidemia, unspecified: Secondary | ICD-10-CM | POA: Diagnosis not present

## 2024-06-20 DIAGNOSIS — E1122 Type 2 diabetes mellitus with diabetic chronic kidney disease: Secondary | ICD-10-CM | POA: Diagnosis not present

## 2024-06-20 DIAGNOSIS — E1142 Type 2 diabetes mellitus with diabetic polyneuropathy: Secondary | ICD-10-CM | POA: Diagnosis not present

## 2024-06-20 DIAGNOSIS — E11621 Type 2 diabetes mellitus with foot ulcer: Secondary | ICD-10-CM | POA: Diagnosis not present

## 2024-06-25 ENCOUNTER — Encounter (HOSPITAL_BASED_OUTPATIENT_CLINIC_OR_DEPARTMENT_OTHER): Attending: Internal Medicine | Admitting: Internal Medicine

## 2024-06-25 DIAGNOSIS — E11622 Type 2 diabetes mellitus with other skin ulcer: Secondary | ICD-10-CM | POA: Diagnosis not present

## 2024-06-25 DIAGNOSIS — E1151 Type 2 diabetes mellitus with diabetic peripheral angiopathy without gangrene: Secondary | ICD-10-CM | POA: Diagnosis not present

## 2024-06-25 DIAGNOSIS — L89620 Pressure ulcer of left heel, unstageable: Secondary | ICD-10-CM | POA: Insufficient documentation

## 2024-06-25 DIAGNOSIS — I87331 Chronic venous hypertension (idiopathic) with ulcer and inflammation of right lower extremity: Secondary | ICD-10-CM | POA: Insufficient documentation

## 2024-06-25 DIAGNOSIS — E11621 Type 2 diabetes mellitus with foot ulcer: Secondary | ICD-10-CM | POA: Diagnosis not present

## 2024-06-25 DIAGNOSIS — L97928 Non-pressure chronic ulcer of unspecified part of left lower leg with other specified severity: Secondary | ICD-10-CM | POA: Insufficient documentation

## 2024-06-25 DIAGNOSIS — L97518 Non-pressure chronic ulcer of other part of right foot with other specified severity: Secondary | ICD-10-CM | POA: Diagnosis not present

## 2024-06-25 DIAGNOSIS — L97822 Non-pressure chronic ulcer of other part of left lower leg with fat layer exposed: Secondary | ICD-10-CM | POA: Diagnosis not present

## 2024-06-25 DIAGNOSIS — L97818 Non-pressure chronic ulcer of other part of right lower leg with other specified severity: Secondary | ICD-10-CM | POA: Insufficient documentation

## 2024-06-25 DIAGNOSIS — L89322 Pressure ulcer of left buttock, stage 2: Secondary | ICD-10-CM | POA: Insufficient documentation

## 2024-06-25 DIAGNOSIS — L97512 Non-pressure chronic ulcer of other part of right foot with fat layer exposed: Secondary | ICD-10-CM | POA: Diagnosis not present

## 2024-06-25 DIAGNOSIS — I771 Stricture of artery: Secondary | ICD-10-CM | POA: Diagnosis not present

## 2024-06-25 DIAGNOSIS — L97812 Non-pressure chronic ulcer of other part of right lower leg with fat layer exposed: Secondary | ICD-10-CM | POA: Diagnosis not present

## 2024-06-26 DIAGNOSIS — I130 Hypertensive heart and chronic kidney disease with heart failure and stage 1 through stage 4 chronic kidney disease, or unspecified chronic kidney disease: Secondary | ICD-10-CM | POA: Diagnosis not present

## 2024-06-26 DIAGNOSIS — N184 Chronic kidney disease, stage 4 (severe): Secondary | ICD-10-CM | POA: Diagnosis not present

## 2024-06-26 DIAGNOSIS — E1122 Type 2 diabetes mellitus with diabetic chronic kidney disease: Secondary | ICD-10-CM | POA: Diagnosis not present

## 2024-06-26 DIAGNOSIS — I5032 Chronic diastolic (congestive) heart failure: Secondary | ICD-10-CM | POA: Diagnosis not present

## 2024-06-26 DIAGNOSIS — E1151 Type 2 diabetes mellitus with diabetic peripheral angiopathy without gangrene: Secondary | ICD-10-CM | POA: Diagnosis not present

## 2024-06-26 DIAGNOSIS — I5023 Acute on chronic systolic (congestive) heart failure: Secondary | ICD-10-CM | POA: Diagnosis not present

## 2024-06-27 DIAGNOSIS — I5032 Chronic diastolic (congestive) heart failure: Secondary | ICD-10-CM | POA: Diagnosis not present

## 2024-06-27 DIAGNOSIS — N184 Chronic kidney disease, stage 4 (severe): Secondary | ICD-10-CM | POA: Diagnosis not present

## 2024-06-27 DIAGNOSIS — I130 Hypertensive heart and chronic kidney disease with heart failure and stage 1 through stage 4 chronic kidney disease, or unspecified chronic kidney disease: Secondary | ICD-10-CM | POA: Diagnosis not present

## 2024-06-27 DIAGNOSIS — E1122 Type 2 diabetes mellitus with diabetic chronic kidney disease: Secondary | ICD-10-CM | POA: Diagnosis not present

## 2024-06-27 DIAGNOSIS — I5023 Acute on chronic systolic (congestive) heart failure: Secondary | ICD-10-CM | POA: Diagnosis not present

## 2024-06-27 DIAGNOSIS — E1151 Type 2 diabetes mellitus with diabetic peripheral angiopathy without gangrene: Secondary | ICD-10-CM | POA: Diagnosis not present

## 2024-07-02 DIAGNOSIS — I5032 Chronic diastolic (congestive) heart failure: Secondary | ICD-10-CM | POA: Diagnosis not present

## 2024-07-02 DIAGNOSIS — E1122 Type 2 diabetes mellitus with diabetic chronic kidney disease: Secondary | ICD-10-CM | POA: Diagnosis not present

## 2024-07-02 DIAGNOSIS — I5023 Acute on chronic systolic (congestive) heart failure: Secondary | ICD-10-CM | POA: Diagnosis not present

## 2024-07-02 DIAGNOSIS — N184 Chronic kidney disease, stage 4 (severe): Secondary | ICD-10-CM | POA: Diagnosis not present

## 2024-07-02 DIAGNOSIS — I130 Hypertensive heart and chronic kidney disease with heart failure and stage 1 through stage 4 chronic kidney disease, or unspecified chronic kidney disease: Secondary | ICD-10-CM | POA: Diagnosis not present

## 2024-07-02 DIAGNOSIS — E1151 Type 2 diabetes mellitus with diabetic peripheral angiopathy without gangrene: Secondary | ICD-10-CM | POA: Diagnosis not present

## 2024-07-04 DIAGNOSIS — I5023 Acute on chronic systolic (congestive) heart failure: Secondary | ICD-10-CM | POA: Diagnosis not present

## 2024-07-04 DIAGNOSIS — E1122 Type 2 diabetes mellitus with diabetic chronic kidney disease: Secondary | ICD-10-CM | POA: Diagnosis not present

## 2024-07-04 DIAGNOSIS — I130 Hypertensive heart and chronic kidney disease with heart failure and stage 1 through stage 4 chronic kidney disease, or unspecified chronic kidney disease: Secondary | ICD-10-CM | POA: Diagnosis not present

## 2024-07-04 DIAGNOSIS — E1151 Type 2 diabetes mellitus with diabetic peripheral angiopathy without gangrene: Secondary | ICD-10-CM | POA: Diagnosis not present

## 2024-07-04 DIAGNOSIS — N184 Chronic kidney disease, stage 4 (severe): Secondary | ICD-10-CM | POA: Diagnosis not present

## 2024-07-04 DIAGNOSIS — I5032 Chronic diastolic (congestive) heart failure: Secondary | ICD-10-CM | POA: Diagnosis not present

## 2024-07-09 ENCOUNTER — Encounter (HOSPITAL_BASED_OUTPATIENT_CLINIC_OR_DEPARTMENT_OTHER): Admitting: Internal Medicine

## 2024-07-09 DIAGNOSIS — E1151 Type 2 diabetes mellitus with diabetic peripheral angiopathy without gangrene: Secondary | ICD-10-CM | POA: Diagnosis not present

## 2024-07-09 DIAGNOSIS — N184 Chronic kidney disease, stage 4 (severe): Secondary | ICD-10-CM | POA: Diagnosis not present

## 2024-07-09 DIAGNOSIS — I130 Hypertensive heart and chronic kidney disease with heart failure and stage 1 through stage 4 chronic kidney disease, or unspecified chronic kidney disease: Secondary | ICD-10-CM | POA: Diagnosis not present

## 2024-07-09 DIAGNOSIS — I5023 Acute on chronic systolic (congestive) heart failure: Secondary | ICD-10-CM | POA: Diagnosis not present

## 2024-07-09 DIAGNOSIS — E1122 Type 2 diabetes mellitus with diabetic chronic kidney disease: Secondary | ICD-10-CM | POA: Diagnosis not present

## 2024-07-09 DIAGNOSIS — I5032 Chronic diastolic (congestive) heart failure: Secondary | ICD-10-CM | POA: Diagnosis not present

## 2024-07-10 DIAGNOSIS — N184 Chronic kidney disease, stage 4 (severe): Secondary | ICD-10-CM | POA: Diagnosis not present

## 2024-07-10 DIAGNOSIS — M898X9 Other specified disorders of bone, unspecified site: Secondary | ICD-10-CM | POA: Diagnosis not present

## 2024-07-10 DIAGNOSIS — D631 Anemia in chronic kidney disease: Secondary | ICD-10-CM | POA: Diagnosis not present

## 2024-07-10 DIAGNOSIS — E1151 Type 2 diabetes mellitus with diabetic peripheral angiopathy without gangrene: Secondary | ICD-10-CM | POA: Diagnosis not present

## 2024-07-10 DIAGNOSIS — I5032 Chronic diastolic (congestive) heart failure: Secondary | ICD-10-CM | POA: Diagnosis not present

## 2024-07-10 DIAGNOSIS — I130 Hypertensive heart and chronic kidney disease with heart failure and stage 1 through stage 4 chronic kidney disease, or unspecified chronic kidney disease: Secondary | ICD-10-CM | POA: Diagnosis not present

## 2024-07-10 DIAGNOSIS — I5023 Acute on chronic systolic (congestive) heart failure: Secondary | ICD-10-CM | POA: Diagnosis not present

## 2024-07-10 DIAGNOSIS — I129 Hypertensive chronic kidney disease with stage 1 through stage 4 chronic kidney disease, or unspecified chronic kidney disease: Secondary | ICD-10-CM | POA: Diagnosis not present

## 2024-07-10 DIAGNOSIS — E1122 Type 2 diabetes mellitus with diabetic chronic kidney disease: Secondary | ICD-10-CM | POA: Diagnosis not present

## 2024-07-11 DIAGNOSIS — N184 Chronic kidney disease, stage 4 (severe): Secondary | ICD-10-CM | POA: Diagnosis not present

## 2024-07-11 DIAGNOSIS — E1151 Type 2 diabetes mellitus with diabetic peripheral angiopathy without gangrene: Secondary | ICD-10-CM | POA: Diagnosis not present

## 2024-07-11 DIAGNOSIS — I5032 Chronic diastolic (congestive) heart failure: Secondary | ICD-10-CM | POA: Diagnosis not present

## 2024-07-11 DIAGNOSIS — E1122 Type 2 diabetes mellitus with diabetic chronic kidney disease: Secondary | ICD-10-CM | POA: Diagnosis not present

## 2024-07-11 DIAGNOSIS — I5023 Acute on chronic systolic (congestive) heart failure: Secondary | ICD-10-CM | POA: Diagnosis not present

## 2024-07-11 DIAGNOSIS — I130 Hypertensive heart and chronic kidney disease with heart failure and stage 1 through stage 4 chronic kidney disease, or unspecified chronic kidney disease: Secondary | ICD-10-CM | POA: Diagnosis not present

## 2024-07-15 ENCOUNTER — Other Ambulatory Visit: Payer: Self-pay | Admitting: Cardiovascular Disease

## 2024-07-16 ENCOUNTER — Telehealth: Payer: Self-pay | Admitting: *Deleted

## 2024-07-16 ENCOUNTER — Other Ambulatory Visit: Payer: Self-pay

## 2024-07-16 DIAGNOSIS — N184 Chronic kidney disease, stage 4 (severe): Secondary | ICD-10-CM | POA: Diagnosis not present

## 2024-07-16 DIAGNOSIS — I5023 Acute on chronic systolic (congestive) heart failure: Secondary | ICD-10-CM | POA: Diagnosis not present

## 2024-07-16 DIAGNOSIS — I5032 Chronic diastolic (congestive) heart failure: Secondary | ICD-10-CM | POA: Diagnosis not present

## 2024-07-16 DIAGNOSIS — I130 Hypertensive heart and chronic kidney disease with heart failure and stage 1 through stage 4 chronic kidney disease, or unspecified chronic kidney disease: Secondary | ICD-10-CM | POA: Diagnosis not present

## 2024-07-16 DIAGNOSIS — E1122 Type 2 diabetes mellitus with diabetic chronic kidney disease: Secondary | ICD-10-CM | POA: Diagnosis not present

## 2024-07-16 DIAGNOSIS — E1151 Type 2 diabetes mellitus with diabetic peripheral angiopathy without gangrene: Secondary | ICD-10-CM | POA: Diagnosis not present

## 2024-07-16 NOTE — Telephone Encounter (Signed)
 Copied from CRM [id number]. Topic: Clinical - Home Health Verbal Orders >> Jul 16, 2024  2:00 PM Robinson DEL wrote: Caller/Agency: Wendy/[facility] Callback Number: [phone] Secure Line Service Requested: Physical Therapy Frequency: 1 week 8 Any new concerns about the patient? No

## 2024-07-17 ENCOUNTER — Encounter (HOSPITAL_BASED_OUTPATIENT_CLINIC_OR_DEPARTMENT_OTHER): Attending: Internal Medicine | Admitting: Internal Medicine

## 2024-07-17 DIAGNOSIS — I87331 Chronic venous hypertension (idiopathic) with ulcer and inflammation of right lower extremity: Secondary | ICD-10-CM | POA: Insufficient documentation

## 2024-07-17 DIAGNOSIS — L97518 Non-pressure chronic ulcer of other part of right foot with other specified severity: Secondary | ICD-10-CM | POA: Insufficient documentation

## 2024-07-17 DIAGNOSIS — L97928 Non-pressure chronic ulcer of unspecified part of left lower leg with other specified severity: Secondary | ICD-10-CM | POA: Diagnosis not present

## 2024-07-17 DIAGNOSIS — E11621 Type 2 diabetes mellitus with foot ulcer: Secondary | ICD-10-CM | POA: Diagnosis not present

## 2024-07-17 DIAGNOSIS — L97818 Non-pressure chronic ulcer of other part of right lower leg with other specified severity: Secondary | ICD-10-CM | POA: Diagnosis not present

## 2024-07-17 DIAGNOSIS — I87312 Chronic venous hypertension (idiopathic) with ulcer of left lower extremity: Secondary | ICD-10-CM | POA: Insufficient documentation

## 2024-07-17 DIAGNOSIS — E1151 Type 2 diabetes mellitus with diabetic peripheral angiopathy without gangrene: Secondary | ICD-10-CM | POA: Insufficient documentation

## 2024-07-17 NOTE — Telephone Encounter (Signed)
 Reached out to [facility] and left verbal orders via secure voicemail

## 2024-07-17 NOTE — Telephone Encounter (Signed)
 Please give the order.  Thanks.

## 2024-07-18 DIAGNOSIS — I5032 Chronic diastolic (congestive) heart failure: Secondary | ICD-10-CM | POA: Diagnosis not present

## 2024-07-18 DIAGNOSIS — E1151 Type 2 diabetes mellitus with diabetic peripheral angiopathy without gangrene: Secondary | ICD-10-CM | POA: Diagnosis not present

## 2024-07-18 DIAGNOSIS — E1122 Type 2 diabetes mellitus with diabetic chronic kidney disease: Secondary | ICD-10-CM | POA: Diagnosis not present

## 2024-07-18 DIAGNOSIS — N184 Chronic kidney disease, stage 4 (severe): Secondary | ICD-10-CM | POA: Diagnosis not present

## 2024-07-18 DIAGNOSIS — I130 Hypertensive heart and chronic kidney disease with heart failure and stage 1 through stage 4 chronic kidney disease, or unspecified chronic kidney disease: Secondary | ICD-10-CM | POA: Diagnosis not present

## 2024-07-18 DIAGNOSIS — I5023 Acute on chronic systolic (congestive) heart failure: Secondary | ICD-10-CM | POA: Diagnosis not present

## 2024-07-20 DIAGNOSIS — L97811 Non-pressure chronic ulcer of other part of right lower leg limited to breakdown of skin: Secondary | ICD-10-CM | POA: Diagnosis not present

## 2024-07-20 DIAGNOSIS — E1142 Type 2 diabetes mellitus with diabetic polyneuropathy: Secondary | ICD-10-CM | POA: Diagnosis not present

## 2024-07-20 DIAGNOSIS — I6529 Occlusion and stenosis of unspecified carotid artery: Secondary | ICD-10-CM | POA: Diagnosis not present

## 2024-07-20 DIAGNOSIS — I2510 Atherosclerotic heart disease of native coronary artery without angina pectoris: Secondary | ICD-10-CM | POA: Diagnosis not present

## 2024-07-20 DIAGNOSIS — I4892 Unspecified atrial flutter: Secondary | ICD-10-CM | POA: Diagnosis not present

## 2024-07-20 DIAGNOSIS — S52044D Nondisplaced fracture of coronoid process of right ulna, subsequent encounter for closed fracture with routine healing: Secondary | ICD-10-CM | POA: Diagnosis not present

## 2024-07-20 DIAGNOSIS — I5032 Chronic diastolic (congestive) heart failure: Secondary | ICD-10-CM | POA: Diagnosis not present

## 2024-07-20 DIAGNOSIS — I272 Pulmonary hypertension, unspecified: Secondary | ICD-10-CM | POA: Diagnosis not present

## 2024-07-20 DIAGNOSIS — E1151 Type 2 diabetes mellitus with diabetic peripheral angiopathy without gangrene: Secondary | ICD-10-CM | POA: Diagnosis not present

## 2024-07-20 DIAGNOSIS — L89322 Pressure ulcer of left buttock, stage 2: Secondary | ICD-10-CM | POA: Diagnosis not present

## 2024-07-20 DIAGNOSIS — L97512 Non-pressure chronic ulcer of other part of right foot with fat layer exposed: Secondary | ICD-10-CM | POA: Diagnosis not present

## 2024-07-20 DIAGNOSIS — K219 Gastro-esophageal reflux disease without esophagitis: Secondary | ICD-10-CM | POA: Diagnosis not present

## 2024-07-20 DIAGNOSIS — L97821 Non-pressure chronic ulcer of other part of left lower leg limited to breakdown of skin: Secondary | ICD-10-CM | POA: Diagnosis not present

## 2024-07-20 DIAGNOSIS — N184 Chronic kidney disease, stage 4 (severe): Secondary | ICD-10-CM | POA: Diagnosis not present

## 2024-07-20 DIAGNOSIS — I130 Hypertensive heart and chronic kidney disease with heart failure and stage 1 through stage 4 chronic kidney disease, or unspecified chronic kidney disease: Secondary | ICD-10-CM | POA: Diagnosis not present

## 2024-07-20 DIAGNOSIS — S2241XD Multiple fractures of ribs, right side, subsequent encounter for fracture with routine healing: Secondary | ICD-10-CM | POA: Diagnosis not present

## 2024-07-20 DIAGNOSIS — I5023 Acute on chronic systolic (congestive) heart failure: Secondary | ICD-10-CM | POA: Diagnosis not present

## 2024-07-20 DIAGNOSIS — E11621 Type 2 diabetes mellitus with foot ulcer: Secondary | ICD-10-CM | POA: Diagnosis not present

## 2024-07-20 DIAGNOSIS — D631 Anemia in chronic kidney disease: Secondary | ICD-10-CM | POA: Diagnosis not present

## 2024-07-20 DIAGNOSIS — E785 Hyperlipidemia, unspecified: Secondary | ICD-10-CM | POA: Diagnosis not present

## 2024-07-20 DIAGNOSIS — M109 Gout, unspecified: Secondary | ICD-10-CM | POA: Diagnosis not present

## 2024-07-20 DIAGNOSIS — N179 Acute kidney failure, unspecified: Secondary | ICD-10-CM | POA: Diagnosis not present

## 2024-07-20 DIAGNOSIS — E1122 Type 2 diabetes mellitus with diabetic chronic kidney disease: Secondary | ICD-10-CM | POA: Diagnosis not present

## 2024-07-20 DIAGNOSIS — I4821 Permanent atrial fibrillation: Secondary | ICD-10-CM | POA: Diagnosis not present

## 2024-07-20 DIAGNOSIS — I872 Venous insufficiency (chronic) (peripheral): Secondary | ICD-10-CM | POA: Diagnosis not present

## 2024-07-23 DIAGNOSIS — E1151 Type 2 diabetes mellitus with diabetic peripheral angiopathy without gangrene: Secondary | ICD-10-CM | POA: Diagnosis not present

## 2024-07-23 DIAGNOSIS — N184 Chronic kidney disease, stage 4 (severe): Secondary | ICD-10-CM | POA: Diagnosis not present

## 2024-07-23 DIAGNOSIS — I130 Hypertensive heart and chronic kidney disease with heart failure and stage 1 through stage 4 chronic kidney disease, or unspecified chronic kidney disease: Secondary | ICD-10-CM | POA: Diagnosis not present

## 2024-07-23 DIAGNOSIS — I5023 Acute on chronic systolic (congestive) heart failure: Secondary | ICD-10-CM | POA: Diagnosis not present

## 2024-07-23 DIAGNOSIS — E1122 Type 2 diabetes mellitus with diabetic chronic kidney disease: Secondary | ICD-10-CM | POA: Diagnosis not present

## 2024-07-23 DIAGNOSIS — I5032 Chronic diastolic (congestive) heart failure: Secondary | ICD-10-CM | POA: Diagnosis not present

## 2024-07-24 ENCOUNTER — Telehealth: Payer: Self-pay

## 2024-07-24 DIAGNOSIS — N184 Chronic kidney disease, stage 4 (severe): Secondary | ICD-10-CM | POA: Diagnosis not present

## 2024-07-24 DIAGNOSIS — I5023 Acute on chronic systolic (congestive) heart failure: Secondary | ICD-10-CM | POA: Diagnosis not present

## 2024-07-24 DIAGNOSIS — E1151 Type 2 diabetes mellitus with diabetic peripheral angiopathy without gangrene: Secondary | ICD-10-CM | POA: Diagnosis not present

## 2024-07-24 DIAGNOSIS — E1122 Type 2 diabetes mellitus with diabetic chronic kidney disease: Secondary | ICD-10-CM | POA: Diagnosis not present

## 2024-07-24 DIAGNOSIS — I130 Hypertensive heart and chronic kidney disease with heart failure and stage 1 through stage 4 chronic kidney disease, or unspecified chronic kidney disease: Secondary | ICD-10-CM | POA: Diagnosis not present

## 2024-07-24 DIAGNOSIS — Z23 Encounter for immunization: Secondary | ICD-10-CM

## 2024-07-24 DIAGNOSIS — I5032 Chronic diastolic (congestive) heart failure: Secondary | ICD-10-CM | POA: Diagnosis not present

## 2024-07-24 MED ORDER — COVID-19 MRNA VAC-TRIS(PFIZER) 30 MCG/0.3ML IM SUSY: 0.3000 mL | PREFILLED_SYRINGE | Freq: Once | INTRAMUSCULAR | 0 refills | Status: AC

## 2024-07-24 NOTE — Telephone Encounter (Signed)
 Sent. Thanks.

## 2024-07-24 NOTE — Telephone Encounter (Signed)
 Copied from CRM [id number]. Topic: Clinical - Request for Lab/Test Order >> Jul 24, 2024 10:53 AM Franky GRADE wrote: Reason for CRM: Patient needs an order for a covid vaccine to be sent to [facility] #[id number] - [place],  - [address] AT [facility] [address]  Phone: [phone] Fax: [phone].  He is scheduled to go to [facility] Monday at 12:00 pm.   Ok to send as pended?

## 2024-07-25 DIAGNOSIS — I5032 Chronic diastolic (congestive) heart failure: Secondary | ICD-10-CM | POA: Diagnosis not present

## 2024-07-25 DIAGNOSIS — I5023 Acute on chronic systolic (congestive) heart failure: Secondary | ICD-10-CM | POA: Diagnosis not present

## 2024-07-25 DIAGNOSIS — E1151 Type 2 diabetes mellitus with diabetic peripheral angiopathy without gangrene: Secondary | ICD-10-CM | POA: Diagnosis not present

## 2024-07-25 DIAGNOSIS — E1122 Type 2 diabetes mellitus with diabetic chronic kidney disease: Secondary | ICD-10-CM | POA: Diagnosis not present

## 2024-07-25 DIAGNOSIS — I130 Hypertensive heart and chronic kidney disease with heart failure and stage 1 through stage 4 chronic kidney disease, or unspecified chronic kidney disease: Secondary | ICD-10-CM | POA: Diagnosis not present

## 2024-07-25 DIAGNOSIS — N184 Chronic kidney disease, stage 4 (severe): Secondary | ICD-10-CM | POA: Diagnosis not present

## 2024-07-29 ENCOUNTER — Other Ambulatory Visit: Payer: Self-pay | Admitting: Cardiovascular Disease

## 2024-07-29 DIAGNOSIS — E1151 Type 2 diabetes mellitus with diabetic peripheral angiopathy without gangrene: Secondary | ICD-10-CM | POA: Diagnosis not present

## 2024-07-29 DIAGNOSIS — L97821 Non-pressure chronic ulcer of other part of left lower leg limited to breakdown of skin: Secondary | ICD-10-CM | POA: Diagnosis not present

## 2024-07-29 DIAGNOSIS — L97512 Non-pressure chronic ulcer of other part of right foot with fat layer exposed: Secondary | ICD-10-CM | POA: Diagnosis not present

## 2024-07-29 DIAGNOSIS — E11621 Type 2 diabetes mellitus with foot ulcer: Secondary | ICD-10-CM | POA: Diagnosis not present

## 2024-07-29 DIAGNOSIS — I5032 Chronic diastolic (congestive) heart failure: Secondary | ICD-10-CM | POA: Diagnosis not present

## 2024-07-29 DIAGNOSIS — I5023 Acute on chronic systolic (congestive) heart failure: Secondary | ICD-10-CM | POA: Diagnosis not present

## 2024-07-29 DIAGNOSIS — I130 Hypertensive heart and chronic kidney disease with heart failure and stage 1 through stage 4 chronic kidney disease, or unspecified chronic kidney disease: Secondary | ICD-10-CM | POA: Diagnosis not present

## 2024-07-29 DIAGNOSIS — L97811 Non-pressure chronic ulcer of other part of right lower leg limited to breakdown of skin: Secondary | ICD-10-CM | POA: Diagnosis not present

## 2024-07-29 DIAGNOSIS — E1122 Type 2 diabetes mellitus with diabetic chronic kidney disease: Secondary | ICD-10-CM | POA: Diagnosis not present

## 2024-07-29 DIAGNOSIS — N184 Chronic kidney disease, stage 4 (severe): Secondary | ICD-10-CM | POA: Diagnosis not present

## 2024-07-29 DIAGNOSIS — I872 Venous insufficiency (chronic) (peripheral): Secondary | ICD-10-CM | POA: Diagnosis not present

## 2024-07-29 DIAGNOSIS — I4821 Permanent atrial fibrillation: Secondary | ICD-10-CM | POA: Diagnosis not present

## 2024-07-30 ENCOUNTER — Encounter (HOSPITAL_BASED_OUTPATIENT_CLINIC_OR_DEPARTMENT_OTHER): Admitting: Internal Medicine

## 2024-07-30 DIAGNOSIS — L97928 Non-pressure chronic ulcer of unspecified part of left lower leg with other specified severity: Secondary | ICD-10-CM

## 2024-07-30 DIAGNOSIS — L97818 Non-pressure chronic ulcer of other part of right lower leg with other specified severity: Secondary | ICD-10-CM | POA: Diagnosis not present

## 2024-07-30 DIAGNOSIS — E11621 Type 2 diabetes mellitus with foot ulcer: Secondary | ICD-10-CM | POA: Diagnosis not present

## 2024-07-30 DIAGNOSIS — L97518 Non-pressure chronic ulcer of other part of right foot with other specified severity: Secondary | ICD-10-CM | POA: Diagnosis not present

## 2024-07-30 DIAGNOSIS — I87312 Chronic venous hypertension (idiopathic) with ulcer of left lower extremity: Secondary | ICD-10-CM | POA: Diagnosis not present

## 2024-07-30 DIAGNOSIS — E1151 Type 2 diabetes mellitus with diabetic peripheral angiopathy without gangrene: Secondary | ICD-10-CM | POA: Diagnosis not present

## 2024-07-30 DIAGNOSIS — I87331 Chronic venous hypertension (idiopathic) with ulcer and inflammation of right lower extremity: Secondary | ICD-10-CM

## 2024-08-01 DIAGNOSIS — N184 Chronic kidney disease, stage 4 (severe): Secondary | ICD-10-CM | POA: Diagnosis not present

## 2024-08-01 DIAGNOSIS — E1151 Type 2 diabetes mellitus with diabetic peripheral angiopathy without gangrene: Secondary | ICD-10-CM | POA: Diagnosis not present

## 2024-08-01 DIAGNOSIS — I130 Hypertensive heart and chronic kidney disease with heart failure and stage 1 through stage 4 chronic kidney disease, or unspecified chronic kidney disease: Secondary | ICD-10-CM | POA: Diagnosis not present

## 2024-08-01 DIAGNOSIS — I5023 Acute on chronic systolic (congestive) heart failure: Secondary | ICD-10-CM | POA: Diagnosis not present

## 2024-08-01 DIAGNOSIS — I5032 Chronic diastolic (congestive) heart failure: Secondary | ICD-10-CM | POA: Diagnosis not present

## 2024-08-01 DIAGNOSIS — E1122 Type 2 diabetes mellitus with diabetic chronic kidney disease: Secondary | ICD-10-CM | POA: Diagnosis not present

## 2024-08-02 ENCOUNTER — Ambulatory Visit: Payer: Self-pay

## 2024-08-02 DIAGNOSIS — E1151 Type 2 diabetes mellitus with diabetic peripheral angiopathy without gangrene: Secondary | ICD-10-CM | POA: Diagnosis not present

## 2024-08-02 DIAGNOSIS — I5032 Chronic diastolic (congestive) heart failure: Secondary | ICD-10-CM | POA: Diagnosis not present

## 2024-08-02 DIAGNOSIS — N184 Chronic kidney disease, stage 4 (severe): Secondary | ICD-10-CM | POA: Diagnosis not present

## 2024-08-02 DIAGNOSIS — E1122 Type 2 diabetes mellitus with diabetic chronic kidney disease: Secondary | ICD-10-CM | POA: Diagnosis not present

## 2024-08-02 DIAGNOSIS — I5023 Acute on chronic systolic (congestive) heart failure: Secondary | ICD-10-CM | POA: Diagnosis not present

## 2024-08-02 DIAGNOSIS — I130 Hypertensive heart and chronic kidney disease with heart failure and stage 1 through stage 4 chronic kidney disease, or unspecified chronic kidney disease: Secondary | ICD-10-CM | POA: Diagnosis not present

## 2024-08-02 NOTE — Telephone Encounter (Signed)
    Message from Dalton C sent at 08/02/2024  9:45 AM EDT  Patient daughter, Odetta called in regarding How many clicks he should take of insulin  when his sugar is getting low, was raised to 10 but then got changed to 5 . Would like to know which one is the correct one.  Stated it went from 112 to 190 the next morning that was the last time they gave him the insulin  which was last night  Her number is [phone] - mobile [phone] - home

## 2024-08-02 NOTE — Telephone Encounter (Signed)
 FYI Only or Action Required?: FYI only for provider.  Patient was last seen in primary care on 05/27/2024 by Cleatus Arlyss RAMAN, MD.  Called Nurse Triage reporting Medication Consultation.  Symptoms began N/A.  Interventions attempted: Nothing.  Symptoms are: NA.  Triage Disposition: Information or Advice Only Call  Patient/caregiver understands and will follow disposition?:     Reason for Disposition  Caller has medicine question only, adult not sick, AND triager answers question  Answer Assessment - Initial Assessment Questions 1. NAME of MEDICINE: What medicine(s) are you calling about?     Lantus  2. QUESTION: What is your question? (e.g., double dose of medicine, side effect)     Review sliding scale. Family thought they needed to go back 5 units each day.  Clarified instructions: Inject 5 Units into the skin daily. 5 units daily and increase by 1 unit per day as long as AM sugar is above 150.  If AM sugar 100-150, no change in daily dose.  If AM sugar below 100, then would decrease lantus  dose by 1 unit.   All questions answered. Advised to call back if blood sugars out of range on sliding scale, develops symptoms, or has additional questions.  Protocols used: Medication Question Call-A-AH

## 2024-08-02 NOTE — Telephone Encounter (Signed)
 FYI Information has been given to patient. I just wanted to let you know

## 2024-08-04 NOTE — Telephone Encounter (Signed)
 Noted. Thanks.

## 2024-08-05 DIAGNOSIS — E1122 Type 2 diabetes mellitus with diabetic chronic kidney disease: Secondary | ICD-10-CM | POA: Diagnosis not present

## 2024-08-05 DIAGNOSIS — I5032 Chronic diastolic (congestive) heart failure: Secondary | ICD-10-CM | POA: Diagnosis not present

## 2024-08-05 DIAGNOSIS — I5023 Acute on chronic systolic (congestive) heart failure: Secondary | ICD-10-CM | POA: Diagnosis not present

## 2024-08-05 DIAGNOSIS — E1151 Type 2 diabetes mellitus with diabetic peripheral angiopathy without gangrene: Secondary | ICD-10-CM | POA: Diagnosis not present

## 2024-08-05 DIAGNOSIS — N184 Chronic kidney disease, stage 4 (severe): Secondary | ICD-10-CM | POA: Diagnosis not present

## 2024-08-05 DIAGNOSIS — I130 Hypertensive heart and chronic kidney disease with heart failure and stage 1 through stage 4 chronic kidney disease, or unspecified chronic kidney disease: Secondary | ICD-10-CM | POA: Diagnosis not present

## 2024-08-06 DIAGNOSIS — E1151 Type 2 diabetes mellitus with diabetic peripheral angiopathy without gangrene: Secondary | ICD-10-CM | POA: Diagnosis not present

## 2024-08-06 DIAGNOSIS — I130 Hypertensive heart and chronic kidney disease with heart failure and stage 1 through stage 4 chronic kidney disease, or unspecified chronic kidney disease: Secondary | ICD-10-CM | POA: Diagnosis not present

## 2024-08-06 DIAGNOSIS — I5023 Acute on chronic systolic (congestive) heart failure: Secondary | ICD-10-CM | POA: Diagnosis not present

## 2024-08-06 DIAGNOSIS — N184 Chronic kidney disease, stage 4 (severe): Secondary | ICD-10-CM | POA: Diagnosis not present

## 2024-08-06 DIAGNOSIS — I5032 Chronic diastolic (congestive) heart failure: Secondary | ICD-10-CM | POA: Diagnosis not present

## 2024-08-06 DIAGNOSIS — E1122 Type 2 diabetes mellitus with diabetic chronic kidney disease: Secondary | ICD-10-CM | POA: Diagnosis not present

## 2024-08-08 DIAGNOSIS — I5023 Acute on chronic systolic (congestive) heart failure: Secondary | ICD-10-CM | POA: Diagnosis not present

## 2024-08-08 DIAGNOSIS — E1151 Type 2 diabetes mellitus with diabetic peripheral angiopathy without gangrene: Secondary | ICD-10-CM | POA: Diagnosis not present

## 2024-08-08 DIAGNOSIS — I5032 Chronic diastolic (congestive) heart failure: Secondary | ICD-10-CM | POA: Diagnosis not present

## 2024-08-08 DIAGNOSIS — I130 Hypertensive heart and chronic kidney disease with heart failure and stage 1 through stage 4 chronic kidney disease, or unspecified chronic kidney disease: Secondary | ICD-10-CM | POA: Diagnosis not present

## 2024-08-08 DIAGNOSIS — N184 Chronic kidney disease, stage 4 (severe): Secondary | ICD-10-CM | POA: Diagnosis not present

## 2024-08-08 DIAGNOSIS — E1122 Type 2 diabetes mellitus with diabetic chronic kidney disease: Secondary | ICD-10-CM | POA: Diagnosis not present

## 2024-08-12 DIAGNOSIS — I130 Hypertensive heart and chronic kidney disease with heart failure and stage 1 through stage 4 chronic kidney disease, or unspecified chronic kidney disease: Secondary | ICD-10-CM | POA: Diagnosis not present

## 2024-08-12 DIAGNOSIS — I5032 Chronic diastolic (congestive) heart failure: Secondary | ICD-10-CM | POA: Diagnosis not present

## 2024-08-12 DIAGNOSIS — I5023 Acute on chronic systolic (congestive) heart failure: Secondary | ICD-10-CM | POA: Diagnosis not present

## 2024-08-12 DIAGNOSIS — E1122 Type 2 diabetes mellitus with diabetic chronic kidney disease: Secondary | ICD-10-CM | POA: Diagnosis not present

## 2024-08-12 DIAGNOSIS — N184 Chronic kidney disease, stage 4 (severe): Secondary | ICD-10-CM | POA: Diagnosis not present

## 2024-08-12 DIAGNOSIS — E1151 Type 2 diabetes mellitus with diabetic peripheral angiopathy without gangrene: Secondary | ICD-10-CM | POA: Diagnosis not present

## 2024-08-13 ENCOUNTER — Encounter (HOSPITAL_BASED_OUTPATIENT_CLINIC_OR_DEPARTMENT_OTHER): Admitting: Internal Medicine

## 2024-08-13 DIAGNOSIS — E11621 Type 2 diabetes mellitus with foot ulcer: Secondary | ICD-10-CM | POA: Diagnosis not present

## 2024-08-13 DIAGNOSIS — L97818 Non-pressure chronic ulcer of other part of right lower leg with other specified severity: Secondary | ICD-10-CM | POA: Diagnosis not present

## 2024-08-13 DIAGNOSIS — L97928 Non-pressure chronic ulcer of unspecified part of left lower leg with other specified severity: Secondary | ICD-10-CM | POA: Diagnosis not present

## 2024-08-13 DIAGNOSIS — I87331 Chronic venous hypertension (idiopathic) with ulcer and inflammation of right lower extremity: Secondary | ICD-10-CM

## 2024-08-13 DIAGNOSIS — L97518 Non-pressure chronic ulcer of other part of right foot with other specified severity: Secondary | ICD-10-CM | POA: Diagnosis not present

## 2024-08-13 DIAGNOSIS — E1151 Type 2 diabetes mellitus with diabetic peripheral angiopathy without gangrene: Secondary | ICD-10-CM | POA: Diagnosis not present

## 2024-08-13 DIAGNOSIS — I87312 Chronic venous hypertension (idiopathic) with ulcer of left lower extremity: Secondary | ICD-10-CM

## 2024-08-15 DIAGNOSIS — I5032 Chronic diastolic (congestive) heart failure: Secondary | ICD-10-CM | POA: Diagnosis not present

## 2024-08-15 DIAGNOSIS — I5023 Acute on chronic systolic (congestive) heart failure: Secondary | ICD-10-CM | POA: Diagnosis not present

## 2024-08-15 DIAGNOSIS — E1122 Type 2 diabetes mellitus with diabetic chronic kidney disease: Secondary | ICD-10-CM | POA: Diagnosis not present

## 2024-08-15 DIAGNOSIS — N184 Chronic kidney disease, stage 4 (severe): Secondary | ICD-10-CM | POA: Diagnosis not present

## 2024-08-15 DIAGNOSIS — E1151 Type 2 diabetes mellitus with diabetic peripheral angiopathy without gangrene: Secondary | ICD-10-CM | POA: Diagnosis not present

## 2024-08-15 DIAGNOSIS — I130 Hypertensive heart and chronic kidney disease with heart failure and stage 1 through stage 4 chronic kidney disease, or unspecified chronic kidney disease: Secondary | ICD-10-CM | POA: Diagnosis not present

## 2024-08-19 ENCOUNTER — Other Ambulatory Visit: Payer: Self-pay

## 2024-08-19 DIAGNOSIS — M109 Gout, unspecified: Secondary | ICD-10-CM | POA: Diagnosis not present

## 2024-08-19 DIAGNOSIS — S2241XD Multiple fractures of ribs, right side, subsequent encounter for fracture with routine healing: Secondary | ICD-10-CM | POA: Diagnosis not present

## 2024-08-19 DIAGNOSIS — N184 Chronic kidney disease, stage 4 (severe): Secondary | ICD-10-CM | POA: Diagnosis not present

## 2024-08-19 DIAGNOSIS — I272 Pulmonary hypertension, unspecified: Secondary | ICD-10-CM | POA: Diagnosis not present

## 2024-08-19 DIAGNOSIS — I4892 Unspecified atrial flutter: Secondary | ICD-10-CM | POA: Diagnosis not present

## 2024-08-19 DIAGNOSIS — E1142 Type 2 diabetes mellitus with diabetic polyneuropathy: Secondary | ICD-10-CM | POA: Diagnosis not present

## 2024-08-19 DIAGNOSIS — L89322 Pressure ulcer of left buttock, stage 2: Secondary | ICD-10-CM | POA: Diagnosis not present

## 2024-08-19 DIAGNOSIS — I5023 Acute on chronic systolic (congestive) heart failure: Secondary | ICD-10-CM | POA: Diagnosis not present

## 2024-08-19 DIAGNOSIS — E1122 Type 2 diabetes mellitus with diabetic chronic kidney disease: Secondary | ICD-10-CM | POA: Diagnosis not present

## 2024-08-19 DIAGNOSIS — L97821 Non-pressure chronic ulcer of other part of left lower leg limited to breakdown of skin: Secondary | ICD-10-CM | POA: Diagnosis not present

## 2024-08-19 DIAGNOSIS — I4821 Permanent atrial fibrillation: Secondary | ICD-10-CM | POA: Diagnosis not present

## 2024-08-19 DIAGNOSIS — L97811 Non-pressure chronic ulcer of other part of right lower leg limited to breakdown of skin: Secondary | ICD-10-CM | POA: Diagnosis not present

## 2024-08-19 DIAGNOSIS — N179 Acute kidney failure, unspecified: Secondary | ICD-10-CM | POA: Diagnosis not present

## 2024-08-19 DIAGNOSIS — E785 Hyperlipidemia, unspecified: Secondary | ICD-10-CM | POA: Diagnosis not present

## 2024-08-19 DIAGNOSIS — I6529 Occlusion and stenosis of unspecified carotid artery: Secondary | ICD-10-CM | POA: Diagnosis not present

## 2024-08-19 DIAGNOSIS — I2510 Atherosclerotic heart disease of native coronary artery without angina pectoris: Secondary | ICD-10-CM | POA: Diagnosis not present

## 2024-08-19 DIAGNOSIS — E1151 Type 2 diabetes mellitus with diabetic peripheral angiopathy without gangrene: Secondary | ICD-10-CM | POA: Diagnosis not present

## 2024-08-19 DIAGNOSIS — E11621 Type 2 diabetes mellitus with foot ulcer: Secondary | ICD-10-CM | POA: Diagnosis not present

## 2024-08-19 DIAGNOSIS — I872 Venous insufficiency (chronic) (peripheral): Secondary | ICD-10-CM | POA: Diagnosis not present

## 2024-08-19 DIAGNOSIS — K219 Gastro-esophageal reflux disease without esophagitis: Secondary | ICD-10-CM | POA: Diagnosis not present

## 2024-08-19 DIAGNOSIS — S52044D Nondisplaced fracture of coronoid process of right ulna, subsequent encounter for closed fracture with routine healing: Secondary | ICD-10-CM | POA: Diagnosis not present

## 2024-08-19 DIAGNOSIS — D631 Anemia in chronic kidney disease: Secondary | ICD-10-CM | POA: Diagnosis not present

## 2024-08-19 DIAGNOSIS — I130 Hypertensive heart and chronic kidney disease with heart failure and stage 1 through stage 4 chronic kidney disease, or unspecified chronic kidney disease: Secondary | ICD-10-CM | POA: Diagnosis not present

## 2024-08-19 DIAGNOSIS — I5032 Chronic diastolic (congestive) heart failure: Secondary | ICD-10-CM | POA: Diagnosis not present

## 2024-08-19 DIAGNOSIS — L97512 Non-pressure chronic ulcer of other part of right foot with fat layer exposed: Secondary | ICD-10-CM | POA: Diagnosis not present

## 2024-08-20 DIAGNOSIS — I130 Hypertensive heart and chronic kidney disease with heart failure and stage 1 through stage 4 chronic kidney disease, or unspecified chronic kidney disease: Secondary | ICD-10-CM | POA: Diagnosis not present

## 2024-08-20 DIAGNOSIS — E1151 Type 2 diabetes mellitus with diabetic peripheral angiopathy without gangrene: Secondary | ICD-10-CM | POA: Diagnosis not present

## 2024-08-20 DIAGNOSIS — I5032 Chronic diastolic (congestive) heart failure: Secondary | ICD-10-CM | POA: Diagnosis not present

## 2024-08-20 DIAGNOSIS — E1122 Type 2 diabetes mellitus with diabetic chronic kidney disease: Secondary | ICD-10-CM | POA: Diagnosis not present

## 2024-08-20 DIAGNOSIS — I5023 Acute on chronic systolic (congestive) heart failure: Secondary | ICD-10-CM | POA: Diagnosis not present

## 2024-08-20 DIAGNOSIS — N184 Chronic kidney disease, stage 4 (severe): Secondary | ICD-10-CM | POA: Diagnosis not present

## 2024-08-20 MED ORDER — HYDRALAZINE HCL 25 MG PO TABS: 25.0000 mg | ORAL_TABLET | Freq: Two times a day (BID) | ORAL | 0 refills | Status: DC

## 2024-08-27 ENCOUNTER — Encounter (HOSPITAL_BASED_OUTPATIENT_CLINIC_OR_DEPARTMENT_OTHER): Attending: Internal Medicine | Admitting: Internal Medicine

## 2024-08-27 DIAGNOSIS — L97518 Non-pressure chronic ulcer of other part of right foot with other specified severity: Secondary | ICD-10-CM | POA: Diagnosis not present

## 2024-08-27 DIAGNOSIS — E11621 Type 2 diabetes mellitus with foot ulcer: Secondary | ICD-10-CM | POA: Diagnosis not present

## 2024-08-27 DIAGNOSIS — L97818 Non-pressure chronic ulcer of other part of right lower leg with other specified severity: Secondary | ICD-10-CM | POA: Insufficient documentation

## 2024-08-27 DIAGNOSIS — I87312 Chronic venous hypertension (idiopathic) with ulcer of left lower extremity: Secondary | ICD-10-CM | POA: Diagnosis not present

## 2024-08-27 DIAGNOSIS — I87331 Chronic venous hypertension (idiopathic) with ulcer and inflammation of right lower extremity: Secondary | ICD-10-CM | POA: Insufficient documentation

## 2024-08-27 DIAGNOSIS — E1151 Type 2 diabetes mellitus with diabetic peripheral angiopathy without gangrene: Secondary | ICD-10-CM | POA: Diagnosis not present

## 2024-08-27 DIAGNOSIS — L97928 Non-pressure chronic ulcer of unspecified part of left lower leg with other specified severity: Secondary | ICD-10-CM | POA: Insufficient documentation

## 2024-08-29 DIAGNOSIS — E1151 Type 2 diabetes mellitus with diabetic peripheral angiopathy without gangrene: Secondary | ICD-10-CM | POA: Diagnosis not present

## 2024-08-29 DIAGNOSIS — I130 Hypertensive heart and chronic kidney disease with heart failure and stage 1 through stage 4 chronic kidney disease, or unspecified chronic kidney disease: Secondary | ICD-10-CM | POA: Diagnosis not present

## 2024-08-29 DIAGNOSIS — N184 Chronic kidney disease, stage 4 (severe): Secondary | ICD-10-CM | POA: Diagnosis not present

## 2024-08-29 DIAGNOSIS — I5023 Acute on chronic systolic (congestive) heart failure: Secondary | ICD-10-CM | POA: Diagnosis not present

## 2024-08-29 DIAGNOSIS — I5032 Chronic diastolic (congestive) heart failure: Secondary | ICD-10-CM | POA: Diagnosis not present

## 2024-08-29 DIAGNOSIS — E1122 Type 2 diabetes mellitus with diabetic chronic kidney disease: Secondary | ICD-10-CM | POA: Diagnosis not present

## 2024-09-03 DIAGNOSIS — E1151 Type 2 diabetes mellitus with diabetic peripheral angiopathy without gangrene: Secondary | ICD-10-CM | POA: Diagnosis not present

## 2024-09-03 DIAGNOSIS — I5023 Acute on chronic systolic (congestive) heart failure: Secondary | ICD-10-CM | POA: Diagnosis not present

## 2024-09-03 DIAGNOSIS — N184 Chronic kidney disease, stage 4 (severe): Secondary | ICD-10-CM | POA: Diagnosis not present

## 2024-09-03 DIAGNOSIS — I130 Hypertensive heart and chronic kidney disease with heart failure and stage 1 through stage 4 chronic kidney disease, or unspecified chronic kidney disease: Secondary | ICD-10-CM | POA: Diagnosis not present

## 2024-09-03 DIAGNOSIS — I5032 Chronic diastolic (congestive) heart failure: Secondary | ICD-10-CM | POA: Diagnosis not present

## 2024-09-03 DIAGNOSIS — E1122 Type 2 diabetes mellitus with diabetic chronic kidney disease: Secondary | ICD-10-CM | POA: Diagnosis not present

## 2024-09-05 DIAGNOSIS — E1151 Type 2 diabetes mellitus with diabetic peripheral angiopathy without gangrene: Secondary | ICD-10-CM | POA: Diagnosis not present

## 2024-09-05 DIAGNOSIS — E1122 Type 2 diabetes mellitus with diabetic chronic kidney disease: Secondary | ICD-10-CM | POA: Diagnosis not present

## 2024-09-05 DIAGNOSIS — I5023 Acute on chronic systolic (congestive) heart failure: Secondary | ICD-10-CM | POA: Diagnosis not present

## 2024-09-05 DIAGNOSIS — I130 Hypertensive heart and chronic kidney disease with heart failure and stage 1 through stage 4 chronic kidney disease, or unspecified chronic kidney disease: Secondary | ICD-10-CM | POA: Diagnosis not present

## 2024-09-05 DIAGNOSIS — I5032 Chronic diastolic (congestive) heart failure: Secondary | ICD-10-CM | POA: Diagnosis not present

## 2024-09-05 DIAGNOSIS — N184 Chronic kidney disease, stage 4 (severe): Secondary | ICD-10-CM | POA: Diagnosis not present

## 2024-09-09 DIAGNOSIS — I5023 Acute on chronic systolic (congestive) heart failure: Secondary | ICD-10-CM | POA: Diagnosis not present

## 2024-09-09 DIAGNOSIS — N184 Chronic kidney disease, stage 4 (severe): Secondary | ICD-10-CM | POA: Diagnosis not present

## 2024-09-09 DIAGNOSIS — I130 Hypertensive heart and chronic kidney disease with heart failure and stage 1 through stage 4 chronic kidney disease, or unspecified chronic kidney disease: Secondary | ICD-10-CM | POA: Diagnosis not present

## 2024-09-09 DIAGNOSIS — I5032 Chronic diastolic (congestive) heart failure: Secondary | ICD-10-CM | POA: Diagnosis not present

## 2024-09-09 DIAGNOSIS — E1151 Type 2 diabetes mellitus with diabetic peripheral angiopathy without gangrene: Secondary | ICD-10-CM | POA: Diagnosis not present

## 2024-09-09 DIAGNOSIS — E1122 Type 2 diabetes mellitus with diabetic chronic kidney disease: Secondary | ICD-10-CM | POA: Diagnosis not present

## 2024-09-10 ENCOUNTER — Encounter (HOSPITAL_BASED_OUTPATIENT_CLINIC_OR_DEPARTMENT_OTHER): Admitting: General Surgery

## 2024-09-10 DIAGNOSIS — N184 Chronic kidney disease, stage 4 (severe): Secondary | ICD-10-CM | POA: Diagnosis not present

## 2024-09-10 DIAGNOSIS — E1151 Type 2 diabetes mellitus with diabetic peripheral angiopathy without gangrene: Secondary | ICD-10-CM | POA: Diagnosis not present

## 2024-09-10 DIAGNOSIS — I5032 Chronic diastolic (congestive) heart failure: Secondary | ICD-10-CM | POA: Diagnosis not present

## 2024-09-10 DIAGNOSIS — I130 Hypertensive heart and chronic kidney disease with heart failure and stage 1 through stage 4 chronic kidney disease, or unspecified chronic kidney disease: Secondary | ICD-10-CM | POA: Diagnosis not present

## 2024-09-10 DIAGNOSIS — E1122 Type 2 diabetes mellitus with diabetic chronic kidney disease: Secondary | ICD-10-CM | POA: Diagnosis not present

## 2024-09-10 DIAGNOSIS — I5023 Acute on chronic systolic (congestive) heart failure: Secondary | ICD-10-CM | POA: Diagnosis not present

## 2024-09-16 DIAGNOSIS — E1151 Type 2 diabetes mellitus with diabetic peripheral angiopathy without gangrene: Secondary | ICD-10-CM | POA: Diagnosis not present

## 2024-09-16 DIAGNOSIS — I5023 Acute on chronic systolic (congestive) heart failure: Secondary | ICD-10-CM | POA: Diagnosis not present

## 2024-09-16 DIAGNOSIS — E1122 Type 2 diabetes mellitus with diabetic chronic kidney disease: Secondary | ICD-10-CM | POA: Diagnosis not present

## 2024-09-16 DIAGNOSIS — I5032 Chronic diastolic (congestive) heart failure: Secondary | ICD-10-CM | POA: Diagnosis not present

## 2024-09-16 DIAGNOSIS — N184 Chronic kidney disease, stage 4 (severe): Secondary | ICD-10-CM | POA: Diagnosis not present

## 2024-09-16 DIAGNOSIS — I130 Hypertensive heart and chronic kidney disease with heart failure and stage 1 through stage 4 chronic kidney disease, or unspecified chronic kidney disease: Secondary | ICD-10-CM | POA: Diagnosis not present

## 2024-09-17 ENCOUNTER — Encounter (HOSPITAL_BASED_OUTPATIENT_CLINIC_OR_DEPARTMENT_OTHER): Admitting: General Surgery

## 2024-09-18 ENCOUNTER — Encounter (HOSPITAL_BASED_OUTPATIENT_CLINIC_OR_DEPARTMENT_OTHER): Attending: Internal Medicine | Admitting: Internal Medicine

## 2024-09-18 DIAGNOSIS — E1142 Type 2 diabetes mellitus with diabetic polyneuropathy: Secondary | ICD-10-CM | POA: Diagnosis not present

## 2024-09-18 DIAGNOSIS — S41101A Unspecified open wound of right upper arm, initial encounter: Secondary | ICD-10-CM | POA: Diagnosis not present

## 2024-09-18 DIAGNOSIS — L97928 Non-pressure chronic ulcer of unspecified part of left lower leg with other specified severity: Secondary | ICD-10-CM | POA: Diagnosis not present

## 2024-09-18 DIAGNOSIS — I87331 Chronic venous hypertension (idiopathic) with ulcer and inflammation of right lower extremity: Secondary | ICD-10-CM | POA: Diagnosis not present

## 2024-09-18 DIAGNOSIS — S52044D Nondisplaced fracture of coronoid process of right ulna, subsequent encounter for closed fracture with routine healing: Secondary | ICD-10-CM | POA: Diagnosis not present

## 2024-09-18 DIAGNOSIS — Z794 Long term (current) use of insulin: Secondary | ICD-10-CM | POA: Diagnosis not present

## 2024-09-18 DIAGNOSIS — L97512 Non-pressure chronic ulcer of other part of right foot with fat layer exposed: Secondary | ICD-10-CM | POA: Diagnosis not present

## 2024-09-18 DIAGNOSIS — L97818 Non-pressure chronic ulcer of other part of right lower leg with other specified severity: Secondary | ICD-10-CM | POA: Diagnosis not present

## 2024-09-18 DIAGNOSIS — I4892 Unspecified atrial flutter: Secondary | ICD-10-CM | POA: Diagnosis not present

## 2024-09-18 DIAGNOSIS — L97811 Non-pressure chronic ulcer of other part of right lower leg limited to breakdown of skin: Secondary | ICD-10-CM | POA: Diagnosis not present

## 2024-09-18 DIAGNOSIS — D631 Anemia in chronic kidney disease: Secondary | ICD-10-CM | POA: Diagnosis not present

## 2024-09-18 DIAGNOSIS — I4821 Permanent atrial fibrillation: Secondary | ICD-10-CM | POA: Diagnosis not present

## 2024-09-18 DIAGNOSIS — I272 Pulmonary hypertension, unspecified: Secondary | ICD-10-CM | POA: Diagnosis not present

## 2024-09-18 DIAGNOSIS — I87312 Chronic venous hypertension (idiopathic) with ulcer of left lower extremity: Secondary | ICD-10-CM

## 2024-09-18 DIAGNOSIS — S31000A Unspecified open wound of lower back and pelvis without penetration into retroperitoneum, initial encounter: Secondary | ICD-10-CM | POA: Diagnosis not present

## 2024-09-18 DIAGNOSIS — I872 Venous insufficiency (chronic) (peripheral): Secondary | ICD-10-CM | POA: Diagnosis not present

## 2024-09-18 DIAGNOSIS — N184 Chronic kidney disease, stage 4 (severe): Secondary | ICD-10-CM | POA: Diagnosis not present

## 2024-09-18 DIAGNOSIS — L97518 Non-pressure chronic ulcer of other part of right foot with other specified severity: Secondary | ICD-10-CM | POA: Diagnosis not present

## 2024-09-18 DIAGNOSIS — I2510 Atherosclerotic heart disease of native coronary artery without angina pectoris: Secondary | ICD-10-CM | POA: Diagnosis not present

## 2024-09-18 DIAGNOSIS — E785 Hyperlipidemia, unspecified: Secondary | ICD-10-CM | POA: Diagnosis not present

## 2024-09-18 DIAGNOSIS — S2241XD Multiple fractures of ribs, right side, subsequent encounter for fracture with routine healing: Secondary | ICD-10-CM | POA: Diagnosis not present

## 2024-09-18 DIAGNOSIS — H3530 Unspecified macular degeneration: Secondary | ICD-10-CM | POA: Diagnosis not present

## 2024-09-18 DIAGNOSIS — E1122 Type 2 diabetes mellitus with diabetic chronic kidney disease: Secondary | ICD-10-CM | POA: Diagnosis not present

## 2024-09-18 DIAGNOSIS — I6529 Occlusion and stenosis of unspecified carotid artery: Secondary | ICD-10-CM | POA: Diagnosis not present

## 2024-09-18 DIAGNOSIS — I5023 Acute on chronic systolic (congestive) heart failure: Secondary | ICD-10-CM | POA: Diagnosis not present

## 2024-09-18 DIAGNOSIS — E11621 Type 2 diabetes mellitus with foot ulcer: Secondary | ICD-10-CM | POA: Diagnosis not present

## 2024-09-18 DIAGNOSIS — M109 Gout, unspecified: Secondary | ICD-10-CM | POA: Diagnosis not present

## 2024-09-18 DIAGNOSIS — I130 Hypertensive heart and chronic kidney disease with heart failure and stage 1 through stage 4 chronic kidney disease, or unspecified chronic kidney disease: Secondary | ICD-10-CM | POA: Diagnosis not present

## 2024-09-18 DIAGNOSIS — S80922D Unspecified superficial injury of left lower leg, subsequent encounter: Secondary | ICD-10-CM | POA: Diagnosis not present

## 2024-09-18 DIAGNOSIS — I4510 Unspecified right bundle-branch block: Secondary | ICD-10-CM | POA: Diagnosis not present

## 2024-09-18 DIAGNOSIS — I5032 Chronic diastolic (congestive) heart failure: Secondary | ICD-10-CM | POA: Diagnosis not present

## 2024-09-18 DIAGNOSIS — E1151 Type 2 diabetes mellitus with diabetic peripheral angiopathy without gangrene: Secondary | ICD-10-CM | POA: Diagnosis not present

## 2024-09-23 ENCOUNTER — Ambulatory Visit: Payer: Self-pay

## 2024-09-23 NOTE — Telephone Encounter (Signed)
 Noted. Thanks.

## 2024-09-23 NOTE — Telephone Encounter (Signed)
 FYI Only or Action Required?: FYI only for provider: appointment scheduled on 11.12.25.  Patient was last seen in primary care on 05/27/2024 by Cleatus Arlyss RAMAN, MD.  Called Nurse Triage reporting Cough.  Symptoms began a week ago.  Interventions attempted: Nothing.  Symptoms are: unchanged.  Triage Disposition: See Physician Within 24 Hours  Patient/caregiver understands and will follow disposition?: Yes   Copied from CRM #[id number]. Topic: Clinical - Red Word Triage >> Sep 23, 2024 11:21 AM Jasmin G wrote: [facility] that prompted transfer to Nurse Triage: Pt's son initially called to make an appt due to pt experiencing a raspy cough with light brown mucus for about a week. Reason for Disposition  Coughing up rusty-colored (reddish-brown) sputum  Answer Assessment - Initial Assessment Questions Pt and son are on the phone. Pt states he is coughing up phlegm, no other symptoms and wants to know what over the counters he can take. RN advised since it has color to it, would like for him to be seen. He is agreeable. Next available at [facility] is Wednesday. RN offered to schedule at another location, pt and son stated Wednesday would be fine. RN gave instructions on when to call back or go to the ER.    1. ONSET: When did the cough begin?      About a week 2. SEVERITY: How bad is the cough today?      Mild/mod 3. SPUTUM: Describe the color of your sputum (e.g., none, dry cough; clear, white, yellow, green)     Light brown 4. HEMOPTYSIS: Are you coughing up any blood? If Yes, ask: How much? (e.g., flecks, streaks, tablespoons, etc.)     no 5. DIFFICULTY BREATHING: Are you having difficulty breathing? If Yes, ask: How bad is it? (e.g., mild, moderate, severe)      no 6. FEVER: Do you have a fever? If Yes, ask: What is your temperature, how was it measured, and when did it start?     No  7. CARDIAC HISTORY: Do you have any history of heart disease? (e.g.,  heart attack, congestive heart failure)      Htn, phtn, chf, cad 8. LUNG HISTORY: Do you have any history of lung disease?  (e.g., pulmonary embolus, asthma, emphysema)     Pulmonary htn 9. PE RISK FACTORS: Do you have a history of blood clots? (or: recent major surgery, recent prolonged travel, bedridden)     no 10. OTHER SYMPTOMS: Do you have any other symptoms? (e.g., runny nose, wheezing, chest pain)       No  Protocols used: Cough - Acute Productive-A-AH

## 2024-09-24 DIAGNOSIS — I872 Venous insufficiency (chronic) (peripheral): Secondary | ICD-10-CM | POA: Diagnosis not present

## 2024-09-24 DIAGNOSIS — I130 Hypertensive heart and chronic kidney disease with heart failure and stage 1 through stage 4 chronic kidney disease, or unspecified chronic kidney disease: Secondary | ICD-10-CM | POA: Diagnosis not present

## 2024-09-24 DIAGNOSIS — I5023 Acute on chronic systolic (congestive) heart failure: Secondary | ICD-10-CM | POA: Diagnosis not present

## 2024-09-24 DIAGNOSIS — L97512 Non-pressure chronic ulcer of other part of right foot with fat layer exposed: Secondary | ICD-10-CM | POA: Diagnosis not present

## 2024-09-24 DIAGNOSIS — E11621 Type 2 diabetes mellitus with foot ulcer: Secondary | ICD-10-CM | POA: Diagnosis not present

## 2024-09-24 DIAGNOSIS — L97811 Non-pressure chronic ulcer of other part of right lower leg limited to breakdown of skin: Secondary | ICD-10-CM | POA: Diagnosis not present

## 2024-09-25 ENCOUNTER — Ambulatory Visit (INDEPENDENT_AMBULATORY_CARE_PROVIDER_SITE_OTHER): Admitting: Family Medicine

## 2024-09-25 ENCOUNTER — Encounter: Payer: Self-pay | Admitting: Family Medicine

## 2024-09-25 ENCOUNTER — Ambulatory Visit (INDEPENDENT_AMBULATORY_CARE_PROVIDER_SITE_OTHER)
Admission: RE | Admit: 2024-09-25 | Discharge: 2024-09-25 | Disposition: A | Discharge status: Home / Self Care | Source: Ambulatory Visit | Attending: Family Medicine | Admitting: Family Medicine

## 2024-09-25 ENCOUNTER — Ambulatory Visit: Payer: Self-pay | Admitting: Family Medicine

## 2024-09-25 VITALS — BP 116/68 | HR 103 | Temp 97.6°F | Ht 67.0 in | Wt 144.8 lb

## 2024-09-25 DIAGNOSIS — I517 Cardiomegaly: Secondary | ICD-10-CM | POA: Diagnosis not present

## 2024-09-25 DIAGNOSIS — R058 Other specified cough: Secondary | ICD-10-CM | POA: Diagnosis not present

## 2024-09-25 DIAGNOSIS — R051 Acute cough: Secondary | ICD-10-CM

## 2024-09-25 DIAGNOSIS — R918 Other nonspecific abnormal finding of lung field: Secondary | ICD-10-CM | POA: Diagnosis not present

## 2024-09-25 DIAGNOSIS — J90 Pleural effusion, not elsewhere classified: Secondary | ICD-10-CM | POA: Diagnosis not present

## 2024-09-25 MED ORDER — AMOXICILLIN-POT CLAVULANATE 875-125 MG PO TABS: 1.0000 | ORAL_TABLET | Freq: Two times a day (BID) | ORAL | 0 refills | Status: DC

## 2024-09-25 MED ORDER — AZITHROMYCIN 250 MG PO TABS: ORAL_TABLET | ORAL | 0 refills | Status: DC

## 2024-09-25 NOTE — Progress Notes (Signed)
 Patient ID: [name], male    DOB: 1932/09/18, 89 y.o.   MRN: [id number]  This visit was conducted in person.  BP 116/68   Pulse (!) 103   Temp 97.6 F (36.4 C) (Oral)   Ht 5' 7 (1.702 m)   Wt 144 lb 12.8 oz (65.7 kg)   SpO2 96%   BMI 22.68 kg/m    CC:  Chief Complaint  Patient presents with   Cough    Coughing up brown mucus. Has been going on for almost 2 weeks. Has not had a chest x-ray. Wondering if it is pneumonia or a cold.     Subjective:   HPI: [name] is a 89 y.o. male presenting on 09/25/2024 for Cough (Coughing up brown mucus. Has been going on for almost 2 weeks. Has not had a chest x-ray. Wondering if it is pneumonia or a cold. )   Date of onset:  2 weeks Initial symptoms included  productive cough.. darker mucus.. beige,  no blood  Symptoms progressed to  chest congestion.  NO SOB, no wheezing   No nasal congestion, no ST, no ear pain.  No fever   Sick contacts:  none COVID testing:   none     He has tried to treat with  coricidin.. has not helped much.     No history of chronic lung disease such as asthma or COPD. Former  remote smoker.  History of CHF, Afib, CAD, CKD, DM      Relevant past medical, surgical, family and social history reviewed and updated as indicated. Interim medical history since our last visit reviewed. Allergies and medications reviewed and updated. Outpatient Medications Prior to Visit  Medication Sig Dispense Refill   acetaminophen  (TYLENOL ) 325 MG tablet Take 1 tablet (325 mg total) by mouth every 6 (six) hours as needed for mild pain (pain score 1-3) (or Fever >/= 101). 20 tablet 0   allopurinol  (ZYLOPRIM ) 100 MG tablet Take 0.5 tablets (50 mg total) by mouth every Monday, Wednesday, and Friday. 20 tablet 3   Cholecalciferol  (VITAMIN D ) 1000 UNITS capsule Take 1,000 Units by mouth at bedtime.     doxycycline  (VIBRA -TABS) 100 MG tablet Take 1 tablet (100 mg total) by mouth 2 (two) times daily. 14 tablet 0    ELIQUIS  2.5 MG TABS tablet TAKE 1 TABLET TWICE A DAY 180 tablet 3   fish oil-omega-3 fatty acids  1000 MG capsule Take 1 g by mouth at bedtime.     glucose blood (FREESTYLE LITE) test strip USE TO TEST BLOOD SUGAR ONCE DAILY AND AS NEEDED 300 strip 3   hydrALAZINE  (APRESOLINE ) 25 MG tablet Take 1 tablet (25 mg total) by mouth 2 (two) times daily. 180 tablet 0   insulin  glargine (LANTUS  SOLOSTAR) 100 UNIT/ML Solostar Pen Inject 5 Units into the skin daily. 5 units daily and increase by 1 unit per day as long as AM sugar is above 150.  If AM sugar 100-150, no change in daily dose.  If AM sugar below 100, then would decrease lantus  dose by 1 unit. 15 mL 0   Insulin  Pen Needle 29G X MISC Use as needed with insulin  injection 100 each 3   ipratropium (ATROVENT ) 0.03 % nasal spray USE 2 SPRAYS IN EACH NOSTRIL TWICE A DAY AS NEEDED FOR RHINITIS 60 mL 3   isosorbide  mononitrate (IMDUR ) 60 MG 24 hr tablet TAKE ONE AND ONE-HALF TABLETS DAILY 135 tablet 2   JARDIANCE  10  MG TABS tablet Take 10 mg by mouth daily.     lidocaine  (LIDODERM ) 5 % Place 1 patch onto the skin daily. Remove & Discard patch within 12 hours or as directed by MD     magnesium  oxide (MAG-OX) 400 MG tablet Take 1 tablet (400 mg total) by mouth every other day.     metoprolol  succinate (TOPROL -XL) 100 MG 24 hr tablet Take 0.5 tablets (50 mg total) by mouth every Monday, Wednesday, and Friday. 1/2 tab Mon, Wed and Fri     nitroGLYCERIN  (NITROSTAT ) 0.4 MG SL tablet DISSOLVE 1 TABLET UNDER THE TONGUE EVERY 5 MINUTES AS NEEDED FOR CHEST PAIN FOR A MAXIMUM OF 3 DOSES 25 tablet 11   polyethylene glycol (MIRALAX  / GLYCOLAX ) 17 g packet Take 17 g by mouth daily.     Potassium Gluconate 595 MG CAPS Take 595 mg by mouth in the morning.     ranolazine  (RANEXA ) 500 MG 12 hr tablet Take 1 tablet (500 mg total) by mouth 2 (two) times daily.     rosuvastatin  (CRESTOR ) 10 MG tablet TAKE 1 TABLET DAILY 90 tablet 3   torsemide  (DEMADEX ) 20 MG tablet Take 1  tablet (20 mg total) by mouth 2 (two) times daily.     No facility-administered medications prior to visit.     Per HPI unless specifically indicated in ROS section below Review of Systems  Constitutional:  Negative for fatigue and fever.  HENT:  Negative for ear pain.   Eyes:  Negative for pain.  Respiratory:  Negative for cough and shortness of breath.   Cardiovascular:  Negative for chest pain, palpitations and leg swelling.  Gastrointestinal:  Negative for abdominal pain.  Genitourinary:  Negative for dysuria.  Musculoskeletal:  Negative for arthralgias.  Neurological:  Negative for syncope, light-headedness and headaches.  Psychiatric/Behavioral:  Negative for dysphoric mood.    Objective:  BP 116/68   Pulse (!) 103   Temp 97.6 F (36.4 C) (Oral)   Ht 5' 7 (1.702 m)   Wt 144 lb 12.8 oz (65.7 kg)   SpO2 96%   BMI 22.68 kg/m   Wt Readings from Last 3 Encounters:  09/25/24 144 lb 12.8 oz (65.7 kg)  05/27/24 160 lb 3.2 oz (72.7 kg)  05/22/24 162 lb (73.5 kg)      Physical Exam Constitutional:      Appearance: He is well-developed.  HENT:     Head: Normocephalic.     Right Ear: Hearing normal.     Left Ear: Hearing normal.     Nose: Nose normal.  Neck:     Thyroid : No thyroid  mass or thyromegaly.     Vascular: No carotid bruit.     Trachea: Trachea normal.  Cardiovascular:     Rate and Rhythm: Normal rate and regular rhythm.     Pulses: Normal pulses.     Heart sounds: Heart sounds not distant. No murmur heard.    No friction rub. No gallop.     Comments: No peripheral edema Pulmonary:     Effort: Pulmonary effort is normal. No respiratory distress.     Breath sounds: Normal breath sounds.  Skin:    General: Skin is warm and dry.     Findings: No rash.  Psychiatric:        Speech: Speech normal.        Behavior: Behavior normal.        Thought Content: Thought content normal.       Results for  orders placed or performed in visit on 05/27/24   Comprehensive metabolic panel with GFR   Collection Time: 05/27/24  1:26 PM  Result Value Ref Range   Sodium 137 135 - 145 mEq/L   Potassium 4.5 3.5 - 5.1 mEq/L   Chloride 99 96 - 112 mEq/L   CO2 28 19 - 32 mEq/L   Glucose, Bld 308 (H) 70 - 99 mg/dL   BUN 43 (H) 6 - 23 mg/dL   Creatinine, Ser 7.34 (H) 0.40 - 1.50 mg/dL   Total Bilirubin 0.7 0.2 - 1.2 mg/dL   Alkaline Phosphatase 143 (H) 39 - 117 U/L   AST 12 0 - 37 U/L   ALT 19 0 - 53 U/L   Total Protein 6.8 6.0 - 8.3 g/dL   Albumin 3.7 3.5 - 5.2 g/dL   GFR 79.38 (L) >39.99 mL/min   Calcium  9.5 8.4 - 10.5 mg/dL  CBC with Differential/Platelet   Collection Time: 05/27/24  1:26 PM  Result Value Ref Range   WBC 7.6 4.0 - 10.5 K/uL   RBC 3.35 (L) 4.22 - 5.81 Mil/uL   Hemoglobin 10.5 (L) 13.0 - 17.0 g/dL   HCT 67.5 (L) 60.9 - 47.9 %   MCV 96.8 78.0 - 100.0 fl   MCHC 32.6 30.0 - 36.0 g/dL   RDW 83.4 (H) 88.4 - 84.4 %   Platelets 207.0 150.0 - 400.0 K/uL   Neutrophils Relative % 79.2 (H) 43.0 - 77.0 %   Lymphocytes Relative 12.8 12.0 - 46.0 %   Monocytes Relative 6.2 3.0 - 12.0 %   Eosinophils Relative 1.6 0.0 - 5.0 %   Basophils Relative 0.2 0.0 - 3.0 %   Neutro Abs 6.0 1.4 - 7.7 K/uL   Lymphs Abs 1.0 0.7 - 4.0 K/uL   Monocytes Absolute 0.5 0.1 - 1.0 K/uL   Eosinophils Absolute 0.1 0.0 - 0.7 K/uL   Basophils Absolute 0.0 0.0 - 0.1 K/uL  Hemoglobin A1c   Collection Time: 05/27/24  1:26 PM  Result Value Ref Range   Hgb A1c MFr Bld 10.2 (H) 4.6 - 6.5 %    Assessment and Plan  Acute cough Assessment & Plan: Acute, viral versus bacterial infection.  Patient minimally symptomatic just with chest congestion, forces cough to bring up mucus. Recommend Mucinex twice daily to thin mucus.  Will evaluate with chest x-ray given patient  age and comorbidity to rule out pneumonia.  Recommend rest and fluids.  Return and ER precautions provided.  Orders: -     DG Chest 2 View; Future    No follow-ups on file.   Greig Ring,  MD

## 2024-09-25 NOTE — Assessment & Plan Note (Signed)
 Acute, viral versus bacterial infection.  Patient minimally symptomatic just with chest congestion, forces cough to bring up mucus. Recommend Mucinex twice daily to thin mucus.  Will evaluate with chest x-ray given patient  age and comorbidity to rule out pneumonia.  Recommend rest and fluids.  Return and ER precautions provided.

## 2024-09-26 DIAGNOSIS — E1151 Type 2 diabetes mellitus with diabetic peripheral angiopathy without gangrene: Secondary | ICD-10-CM | POA: Diagnosis not present

## 2024-09-26 DIAGNOSIS — L97811 Non-pressure chronic ulcer of other part of right lower leg limited to breakdown of skin: Secondary | ICD-10-CM | POA: Diagnosis not present

## 2024-09-26 DIAGNOSIS — E11621 Type 2 diabetes mellitus with foot ulcer: Secondary | ICD-10-CM | POA: Diagnosis not present

## 2024-09-26 DIAGNOSIS — N184 Chronic kidney disease, stage 4 (severe): Secondary | ICD-10-CM | POA: Diagnosis not present

## 2024-09-26 DIAGNOSIS — E1122 Type 2 diabetes mellitus with diabetic chronic kidney disease: Secondary | ICD-10-CM | POA: Diagnosis not present

## 2024-09-26 DIAGNOSIS — I872 Venous insufficiency (chronic) (peripheral): Secondary | ICD-10-CM | POA: Diagnosis not present

## 2024-09-26 DIAGNOSIS — I130 Hypertensive heart and chronic kidney disease with heart failure and stage 1 through stage 4 chronic kidney disease, or unspecified chronic kidney disease: Secondary | ICD-10-CM | POA: Diagnosis not present

## 2024-09-26 DIAGNOSIS — I4821 Permanent atrial fibrillation: Secondary | ICD-10-CM | POA: Diagnosis not present

## 2024-09-26 DIAGNOSIS — I272 Pulmonary hypertension, unspecified: Secondary | ICD-10-CM | POA: Diagnosis not present

## 2024-09-26 DIAGNOSIS — I5023 Acute on chronic systolic (congestive) heart failure: Secondary | ICD-10-CM | POA: Diagnosis not present

## 2024-09-26 DIAGNOSIS — I5032 Chronic diastolic (congestive) heart failure: Secondary | ICD-10-CM | POA: Diagnosis not present

## 2024-09-26 DIAGNOSIS — L97512 Non-pressure chronic ulcer of other part of right foot with fat layer exposed: Secondary | ICD-10-CM | POA: Diagnosis not present

## 2024-09-28 ENCOUNTER — Emergency Department (HOSPITAL_COMMUNITY)

## 2024-09-28 ENCOUNTER — Ambulatory Visit (HOSPITAL_COMMUNITY): Admission: EM | Admit: 2024-09-28 | Discharge: 2024-09-28 | Disposition: A | Discharge status: Home / Self Care

## 2024-09-28 ENCOUNTER — Encounter (HOSPITAL_COMMUNITY): Payer: Self-pay

## 2024-09-28 ENCOUNTER — Emergency Department (HOSPITAL_COMMUNITY)
Admission: EM | Admit: 2024-09-28 | Discharge: 2024-09-28 | Disposition: A | Discharge status: Home / Self Care | Attending: Emergency Medicine | Admitting: Emergency Medicine

## 2024-09-28 DIAGNOSIS — M47816 Spondylosis without myelopathy or radiculopathy, lumbar region: Secondary | ICD-10-CM | POA: Diagnosis not present

## 2024-09-28 DIAGNOSIS — W010XXA Fall on same level from slipping, tripping and stumbling without subsequent striking against object, initial encounter: Secondary | ICD-10-CM | POA: Insufficient documentation

## 2024-09-28 DIAGNOSIS — N184 Chronic kidney disease, stage 4 (severe): Secondary | ICD-10-CM | POA: Insufficient documentation

## 2024-09-28 DIAGNOSIS — Z7984 Long term (current) use of oral hypoglycemic drugs: Secondary | ICD-10-CM | POA: Diagnosis not present

## 2024-09-28 DIAGNOSIS — E1142 Type 2 diabetes mellitus with diabetic polyneuropathy: Secondary | ICD-10-CM | POA: Insufficient documentation

## 2024-09-28 DIAGNOSIS — Z794 Long term (current) use of insulin: Secondary | ICD-10-CM | POA: Diagnosis not present

## 2024-09-28 DIAGNOSIS — E1122 Type 2 diabetes mellitus with diabetic chronic kidney disease: Secondary | ICD-10-CM | POA: Diagnosis not present

## 2024-09-28 DIAGNOSIS — Z79899 Other long term (current) drug therapy: Secondary | ICD-10-CM | POA: Diagnosis not present

## 2024-09-28 DIAGNOSIS — S51011A Laceration without foreign body of right elbow, initial encounter: Secondary | ICD-10-CM | POA: Diagnosis not present

## 2024-09-28 DIAGNOSIS — I959 Hypotension, unspecified: Secondary | ICD-10-CM | POA: Diagnosis not present

## 2024-09-28 DIAGNOSIS — R918 Other nonspecific abnormal finding of lung field: Secondary | ICD-10-CM | POA: Diagnosis not present

## 2024-09-28 DIAGNOSIS — T148XXA Other injury of unspecified body region, initial encounter: Secondary | ICD-10-CM | POA: Diagnosis not present

## 2024-09-28 DIAGNOSIS — S41111A Laceration without foreign body of right upper arm, initial encounter: Secondary | ICD-10-CM | POA: Diagnosis not present

## 2024-09-28 DIAGNOSIS — I130 Hypertensive heart and chronic kidney disease with heart failure and stage 1 through stage 4 chronic kidney disease, or unspecified chronic kidney disease: Secondary | ICD-10-CM | POA: Insufficient documentation

## 2024-09-28 DIAGNOSIS — S199XXA Unspecified injury of neck, initial encounter: Secondary | ICD-10-CM | POA: Diagnosis not present

## 2024-09-28 DIAGNOSIS — Z23 Encounter for immunization: Secondary | ICD-10-CM | POA: Diagnosis not present

## 2024-09-28 DIAGNOSIS — R296 Repeated falls: Secondary | ICD-10-CM | POA: Diagnosis not present

## 2024-09-28 DIAGNOSIS — Z87891 Personal history of nicotine dependence: Secondary | ICD-10-CM | POA: Diagnosis not present

## 2024-09-28 DIAGNOSIS — Z951 Presence of aortocoronary bypass graft: Secondary | ICD-10-CM | POA: Diagnosis not present

## 2024-09-28 DIAGNOSIS — S299XXA Unspecified injury of thorax, initial encounter: Secondary | ICD-10-CM | POA: Diagnosis not present

## 2024-09-28 DIAGNOSIS — G9389 Other specified disorders of brain: Secondary | ICD-10-CM | POA: Diagnosis not present

## 2024-09-28 DIAGNOSIS — I4891 Unspecified atrial fibrillation: Secondary | ICD-10-CM | POA: Diagnosis not present

## 2024-09-28 DIAGNOSIS — M4802 Spinal stenosis, cervical region: Secondary | ICD-10-CM | POA: Diagnosis not present

## 2024-09-28 DIAGNOSIS — I5043 Acute on chronic combined systolic (congestive) and diastolic (congestive) heart failure: Secondary | ICD-10-CM | POA: Diagnosis not present

## 2024-09-28 DIAGNOSIS — S0990XA Unspecified injury of head, initial encounter: Secondary | ICD-10-CM | POA: Insufficient documentation

## 2024-09-28 DIAGNOSIS — W19XXXA Unspecified fall, initial encounter: Secondary | ICD-10-CM

## 2024-09-28 DIAGNOSIS — I2510 Atherosclerotic heart disease of native coronary artery without angina pectoris: Secondary | ICD-10-CM | POA: Diagnosis not present

## 2024-09-28 DIAGNOSIS — I6523 Occlusion and stenosis of bilateral carotid arteries: Secondary | ICD-10-CM | POA: Diagnosis not present

## 2024-09-28 DIAGNOSIS — I10 Essential (primary) hypertension: Secondary | ICD-10-CM | POA: Diagnosis not present

## 2024-09-28 DIAGNOSIS — S3993XA Unspecified injury of pelvis, initial encounter: Secondary | ICD-10-CM | POA: Diagnosis not present

## 2024-09-28 DIAGNOSIS — Z7901 Long term (current) use of anticoagulants: Secondary | ICD-10-CM | POA: Insufficient documentation

## 2024-09-28 DIAGNOSIS — S59901A Unspecified injury of right elbow, initial encounter: Secondary | ICD-10-CM | POA: Diagnosis present

## 2024-09-28 DIAGNOSIS — M47812 Spondylosis without myelopathy or radiculopathy, cervical region: Secondary | ICD-10-CM | POA: Diagnosis not present

## 2024-09-28 DIAGNOSIS — J90 Pleural effusion, not elsewhere classified: Secondary | ICD-10-CM | POA: Diagnosis not present

## 2024-09-28 MED ORDER — TETANUS-DIPHTH-ACELL PERTUSSIS 5-2-15.5 LF-MCG/0.5 IM SUSP
0.5000 mL | Freq: Once | INTRAMUSCULAR | Status: AC
Start: 2024-09-28 — End: 2024-09-28
  Administered 2024-09-28: 0.5 mL via INTRAMUSCULAR
  Filled 2024-09-28: qty 0.5

## 2024-09-28 NOTE — ED Notes (Signed)
 [facility] notified of need for transportation to the ED.

## 2024-09-28 NOTE — ED Notes (Signed)
 Jennifer,RN -Consulting Civil Engineer at [facility] ED notified of patient coming to the ED

## 2024-09-28 NOTE — Discharge Instructions (Signed)
 We called an ambulance to transport him to the hospital for further evaluation.

## 2024-09-28 NOTE — ED Notes (Signed)
 Abrasions noted on right rear flank and left rear flank. Bleeding controlled, all wounds cleaned and dressed. Abrasions noted in media

## 2024-09-28 NOTE — ED Notes (Signed)
 Patient is being discharged from the Urgent Care and sent to the Emergency Department via [facility], patient is in need of higher level of care due to unwitnessed fall, hypotension and taking Eliquis . Patient is aware and verbalizes understanding of plan of care.  Vitals:   09/28/24 1920  BP: (!) 91/57  Pulse: 90  Resp: 16  Temp: 97.8 F (36.6 C)  SpO2: 96%

## 2024-09-28 NOTE — Progress Notes (Signed)
 Orthopedic Tech Progress Note Patient Details:  [name] February 08, 1932 [id number]  Patient ID: [name] Needle, male   DOB: 1932/03/03, 89 y.o.   MRN: [id number] I attended trauma page. Chandra Dorn PARAS 09/28/2024, 9:04 PM

## 2024-09-28 NOTE — ED Triage Notes (Addendum)
 Patient brought in by his children. Patient lives alone.  Patient fell in the bathroom today. Patient is on Eliquis .  Patient has a skin tear to the right elbow, right shoulder and 2 abrasions to the mid back.  Patient states, I hit my head slightly on the shower door.  Patient denies blurred vision, N/v, headache, dizziness.  Patient's daughter reports that she used wound cleanser to all of the wounds and then bandaged.

## 2024-09-28 NOTE — ED Provider Notes (Signed)
 [facility]    CSN: [id number] Arrival date & time: 09/28/24  1853      History   Chief Complaint Chief Complaint  Patient presents with   Fall    HPI [name] is a 89 y.o. male.   Patient presents with his son and daughter after unwitnessed fall that occurred today.  Per patient he fell in the bathroom today while getting off of the toilet and was unable to get off the floor by himself.  Patient called his son and daughter to assist him but reports that he was on the ground for approximately an hour before they were able to assist him off of the ground.  Patient states that he thinks he might of hit his head on the shower door.  Patient does have skin tears to his right elbow, right shoulder, and a few abrasions to his mid back.  Daughter reports that she did clean these with wound cleanser and bandaged them.  Patient is currently taking Eliquis .  Patient denies any blurred vision, headache, dizziness, nausea, vomiting, confusion, facial droop, or slurred speech.  At baseline patient does have generalized weakness and therefore has difficulty getting around by himself.  Daughter reports that this is patient's seventh fall this year.  Daughter does report that patient lives at home alone but does have caretakers that come to check on him periodically.  The history is provided by the patient and a relative.  Fall    Past Medical History:  Diagnosis Date   Age-related macular degeneration, wet, both eyes ([name])    Anemia    Secondary to acute blood loss   Anginal pain    last chest pain in Feb 2021   Arthritis    mild in hands, knees, ankles (11/13/2015)   Atrial fibrillation ([name])    Consideration was given for atrial flutter ablation, but patient developed atrial fibrillation. Cardioversion was done. Dr. Fernande decided to watch him clinically. November, 2011   Atrial flutter [facility]) 07/2010   September, 2011   Hospital with PNA and cath done...Coumadin .   Atrial flutter ablation planned, but  pt. then had atrial fibrillation,/outpatient conversion 09/08/10..NSR..plan to follow..Dr. Fernande   CAD (coronary artery disease)    Catheterization, September, 2011,  grafts patent from redo CABG,, medical therapy of coronary disease, consideration to proceeding with atrial flutter ablation   Carotid artery disease    Doppler 09/18/2009 - 49% bilateral stenoses   Carotid artery disease    49% bilateral, Doppler, November, 2010   CHF (congestive heart failure) ([name])    Chronic kidney disease (CKD), stage III (moderate) ([name])    Diabetic peripheral neuropathy ([name])    feet   Diverticulosis of colon with hemorrhage 2009   several unit diverticular bleed    Erectile dysfunction    Mild   Gout    Hearing loss    wears hearing aids   History of blood transfusion several   related to diverticular bleeding   Hyperlipidemia    Hypertension    Mitral regurgitation    Mild, echo, September, 2011   NSTEMI (non-ST elevated myocardial infarction) ([name]) 07/2010   at [facility] with repeat cath, rec medical mgmt    OSA on CPAP    uses CPAP nightly   Personal history of colonic polyps    PNA (pneumonia) 07/2010   NSTEMI at [facility] with repeat cath, rec medical mgmt    RBBB (right bundle branch block)    S/P mitral valve repair  02/06/2020   s/p TEER with a single MitraClip NTW placed on A2/P2   Shoulder pain    positional; better now (11/13/2015)   Skin cancer    R lower leg, per derm 2012   Type II diabetes mellitus ([name])     Patient Active Problem List   Diagnosis Date Noted   Acute cough 09/25/2024   Ulcer of toe ([name]) 05/29/2024   Venous stasis ulcers ([name]) 05/22/2024   Multiple closed fractures of ribs of right side 04/13/2024   PVD (peripheral vascular disease) 04/13/2024   Acute renal failure superimposed on stage 4 chronic kidney disease ([name]) 04/13/2024   Acute on chronic systolic CHF (congestive heart failure) ([name]) 04/13/2024   Fall 04/12/2024    Urticaria 02/12/2024   Neuropathic pain 02/11/2024   Diabetic neuropathy with neurologic complication ([name]) 02/11/2024   S/P coronary artery stent placement 02/03/2024   Atherosclerosis of native coronary artery of native heart without angina pectoris 02/03/2024   Diastolic congestive heart failure ([name]) 02/01/2024   Chronic atrial fibrillation ([name]) 02/01/2024   Hypoglycemia 02/01/2024   Hypoglycemia associated with diabetes ([name]) 01/31/2024   Chronic systolic CHF (congestive heart failure) ([name]) 01/31/2024   DNR (do not resuscitate) 01/31/2024   Aortic stenosis 12/19/2023   Healthcare maintenance 11/22/2023   Shoulder pain 11/22/2023   Prolonged QT interval 08/18/2023   Gastritis and gastroduodenitis 08/18/2023   Duodenal ulcer 08/18/2023   Bilateral lower extremity edema 03/01/2023   Skin lesion 11/06/2022   Laceration of right lower extremity 11/25/2021   Rhinitis 11/01/2020   Non-rheumatic mitral regurgitation 02/06/2020   H/O mitral valve repair 02/06/2020   Permanent atrial fibrillation ([name])    CAD (coronary artery disease) 08/02/2018   Macular degeneration 06/24/2018   Abdominal wall hernia 05/24/2017   History of lower GI bleeding    Pulmonary hypertension ([name])    HFrEF (heart failure with reduced ejection fraction) ([name]) 11/20/2015   Anemia    Gout 09/09/2014   Advance care planning 09/09/2014   Chronic anticoagulation    RBBB (right bundle branch block)    Carotid artery disease    Hx of CABG    CKD (chronic kidney disease) stage 4, GFR 15-29 ml/min ([name]) 03/24/2011   Peripheral sensory neuropathy due to type 2 diabetes mellitus ([name])    GERD    Sleep apnea    HLD (hyperlipidemia)    HTN (hypertension)     Past Surgical History:  Procedure Laterality Date   CARDIAC CATHETERIZATION  2006   Nuclear..slight lateral ischemia..medical therapy   CARDIAC CATHETERIZATION  08/04/2010   grafts patent from redo CABG...medical Rx and ablate Atrial flutter (LV not  injected)    CARDIOVERSION  ~ 2010   CAROTID ENDARTERECTOMY Right 1994   CATARACT EXTRACTION W/ INTRAOCULAR LENS  IMPLANT, BILATERAL Bilateral    CHOLECYSTECTOMY     COLON RESECTION N/A 01/13/2016   Procedure: EXPLORATORY LAPAROTOMY, LEFT AND SIGMOID COLON REMOVAL;  Surgeon: Lynda Leos, MD;  Location: [facility] OR;  Service: General;  Laterality: N/A;  Extended open left hemicolectomy and sigmoidectomy    COLONOSCOPY N/A 11/14/2015   Procedure: COLONOSCOPY;  Surgeon: Elspeth Deward Naval, MD;  Location: [facility] ENDOSCOPY;  Service: Gastroenterology;  Laterality: N/A;   COLONOSCOPY Left 01/05/2016   Procedure: COLONOSCOPY;  Surgeon: Elspeth Deward Naval, MD;  Location: [facility] ENDOSCOPY;  Service: Gastroenterology;  Laterality: Left;  no sedation to start, moderate if needed   COLONOSCOPY W/ POLYPECTOMY     CORONARY ARTERY BYPASS GRAFT  1995; 2006  X 3; X3   CORONARY ARTERY BYPASS GRAFT  1995, 2006   CORONARY STENT INTERVENTION N/A 02/02/2024   Procedure: CORONARY STENT INTERVENTION;  Surgeon: Wonda Sharper, MD;  Location: [facility] INVASIVE CV LAB;  Service: Cardiovascular;  Laterality: N/A;   DOPPLER ECHOCARDIOGRAPHY  08/2008   EF 60%   DOPPLER ECHOCARDIOGRAPHY  08/02/2010   65-70%   DOPPLER ECHOCARDIOGRAPHY  07/2010   MR mild   ESOPHAGOGASTRODUODENOSCOPY N/A 06/19/2014   Procedure: ESOPHAGOGASTRODUODENOSCOPY (EGD);  Surgeon: Gordy CHRISTELLA Starch, MD;  Location: [facility] ENDOSCOPY;  Service: Endoscopy;  Laterality: N/A;   ESOPHAGOGASTRODUODENOSCOPY N/A 11/14/2015   Procedure: ESOPHAGOGASTRODUODENOSCOPY (EGD);  Surgeon: Elspeth Deward Naval, MD;  Location: [facility] ENDOSCOPY;  Service: Gastroenterology;  Laterality: N/A;   ESOPHAGOGASTRODUODENOSCOPY (EGD) WITH PROPOFOL  N/A 08/18/2023   Procedure: ESOPHAGOGASTRODUODENOSCOPY (EGD) WITH PROPOFOL ;  Surgeon: San Sandor GAILS, DO;  Location: [facility] ENDOSCOPY;  Service: Gastroenterology;  Laterality: N/A;   FLEXIBLE SIGMOIDOSCOPY N/A 11/16/2015   Procedure: FLEXIBLE  SIGMOIDOSCOPY;  Surgeon: Elspeth Deward Naval, MD;  Location: [facility] ENDOSCOPY;  Service: Gastroenterology;  Laterality: N/A;   LAPAROSCOPIC CHOLECYSTECTOMY  2008   LEFT HEART CATH N/A 06/14/2014   Procedure: LEFT HEART CATH;  Surgeon: Victory LELON Claudene DOUGLAS, MD;  Location: [facility] CATH LAB;  Service: Cardiovascular;  Laterality: N/A;   LEFT HEART CATH AND CORONARY ANGIOGRAPHY N/A 08/02/2018   Procedure: LEFT HEART CATH AND CORONARY ANGIOGRAPHY;  Surgeon: Court Dorn PARAS, MD;  Location: [facility] INVASIVE CV LAB;  Service: Cardiovascular;  Laterality: N/A;   LEFT HEART CATHETERIZATION WITH CORONARY/GRAFT ANGIOGRAM N/A 06/11/2014   Procedure: LEFT HEART CATHETERIZATION WITH EL BILE;  Surgeon: Victory LELON Claudene DOUGLAS, MD;  Location: [facility] CATH LAB;  Service: Cardiovascular;  Laterality: N/A;   PERCUTANEOUS CORONARY STENT INTERVENTION (PCI-S) N/A 06/13/2014   Procedure: PERCUTANEOUS CORONARY STENT INTERVENTION (PCI-S);  Surgeon: Victory LELON Claudene DOUGLAS, MD;  Location: [facility] CATH LAB;  Service: Cardiovascular;  Laterality: N/A;   RIGHT HEART CATH N/A 01/06/2020   Procedure: RIGHT HEART CATH;  Surgeon: Cherrie Toribio SAUNDERS, MD;  Location: [facility] INVASIVE CV LAB;  Service: Cardiovascular;  Laterality: N/A;   RIGHT/LEFT HEART CATH AND CORONARY/GRAFT ANGIOGRAPHY N/A 02/02/2024   Procedure: RIGHT/LEFT HEART CATH AND CORONARY/GRAFT ANGIOGRAPHY;  Surgeon: Wonda Sharper, MD;  Location: [facility] INVASIVE CV LAB;  Service: Cardiovascular;  Laterality: N/A;   SKIN CANCER EXCISION Left 10/2015   calf   SKIN CANCER EXCISION Right 2014?   chest   TEE WITHOUT CARDIOVERSION N/A 01/06/2020   Procedure: TRANSESOPHAGEAL ECHOCARDIOGRAM (TEE);  Surgeon: Cherrie Toribio SAUNDERS, MD;  Location: [facility] ENDOSCOPY;  Service: Cardiovascular;  Laterality: N/A;   TONSILLECTOMY     TRANSCATHETER MITRAL EDGE TO EDGE REPAIR N/A 02/06/2020   Procedure: MITRAL VALVE REPAIR;  Surgeon: Wonda Sharper, MD;  Location: [facility] INVASIVE CV LAB;  Service: Cardiovascular;   Laterality: N/A;   VASECTOMY         Home Medications    Prior to Admission medications   Medication Sig Start Date End Date Taking? Authorizing Provider  acetaminophen  (TYLENOL ) 325 MG tablet Take 1 tablet (325 mg total) by mouth every 6 (six) hours as needed for mild pain (pain score 1-3) (or Fever >/= 101). 05/21/24   Cleatus Arlyss RAMAN, MD  allopurinol  (ZYLOPRIM ) 100 MG tablet Take 0.5 tablets (50 mg total) by mouth every Monday, Wednesday, and Friday. 04/03/24   Cleatus Arlyss RAMAN, MD  amoxicillin -clavulanate (AUGMENTIN ) 875-125 MG tablet Take 1 tablet by mouth 2 (two) times daily. 09/25/24   Avelina Greig BRAVO, MD  azithromycin (ZITHROMAX) 250 MG tablet 2 tab po x 1 day then 1 tab po daily 09/25/24   Bedsole, Amy E, MD  Cholecalciferol  (VITAMIN D ) 1000 UNITS capsule Take 1,000 Units by mouth at bedtime.    [provider]  ELIQUIS  2.5 MG TABS tablet TAKE 1 TABLET TWICE A DAY 12/04/23   Court Dorn PARAS, MD  fish oil-omega-3 fatty acids  1000 MG capsule Take 1 g by mouth at bedtime.    [provider]  glucose blood (FREESTYLE LITE) test strip USE TO TEST BLOOD SUGAR ONCE DAILY AND AS NEEDED 11/23/23   Cleatus Arlyss RAMAN, MD  hydrALAZINE  (APRESOLINE ) 25 MG tablet Take 1 tablet (25 mg total) by mouth 2 (two) times daily. 08/20/24   Cleatus Arlyss RAMAN, MD  insulin  glargine (LANTUS  SOLOSTAR) 100 UNIT/ML Solostar Pen Inject 5 Units into the skin daily. 5 units daily and increase by 1 unit per day as long as AM sugar is above 150.  If AM sugar 100-150, no change in daily dose.  If AM sugar below 100, then would decrease lantus  dose by 1 unit. 06/03/24   Cleatus Arlyss RAMAN, MD  Insulin  Pen Needle 29G X MISC Use as needed with insulin  injection 06/09/24   Cleatus Arlyss RAMAN, MD  ipratropium (ATROVENT ) 0.03 % nasal spray USE 2 SPRAYS IN EACH NOSTRIL TWICE A DAY AS NEEDED FOR RHINITIS 03/08/24   Cleatus Arlyss RAMAN, MD  isosorbide  mononitrate (IMDUR ) 60 MG 24 hr tablet TAKE ONE AND ONE-HALF TABLETS DAILY  07/17/24   Court Dorn PARAS, MD  JARDIANCE  10 MG TABS tablet Take 10 mg by mouth daily. 03/11/24   [provider]  lidocaine  (LIDODERM ) 5 % Place 1 patch onto the skin daily. Remove & Discard patch within 12 hours or as directed by MD 04/15/24   Christobal Guadalajara, MD  magnesium  oxide (MAG-OX) 400 MG tablet Take 1 tablet (400 mg total) by mouth every other day. 04/13/23   Cleatus Arlyss RAMAN, MD  metoprolol  succinate (TOPROL -XL) 100 MG 24 hr tablet Take 0.5 tablets (50 mg total) by mouth every Monday, Wednesday, and Friday. 1/2 tab Mon, Wed and Fri 05/27/24   Cleatus Arlyss RAMAN, MD  nitroGLYCERIN  (NITROSTAT ) 0.4 MG SL tablet DISSOLVE 1 TABLET UNDER THE TONGUE EVERY 5 MINUTES AS NEEDED FOR CHEST PAIN FOR A MAXIMUM OF 3 DOSES 07/01/22   Court Dorn PARAS, MD  polyethylene glycol (MIRALAX  / GLYCOLAX ) 17 g packet Take 17 g by mouth daily.    [provider]  Potassium Gluconate 595 MG CAPS Take 595 mg by mouth in the morning.    [provider]  ranolazine  (RANEXA ) 500 MG 12 hr tablet Take 1 tablet (500 mg total) by mouth 2 (two) times daily. 05/27/24   Cleatus Arlyss RAMAN, MD  rosuvastatin  (CRESTOR ) 10 MG tablet TAKE 1 TABLET DAILY 02/07/24   Court Dorn PARAS, MD  torsemide  (DEMADEX ) 20 MG tablet Take 1 tablet (20 mg total) by mouth 2 (two) times daily. 05/27/24   Cleatus Arlyss RAMAN, MD    Family History Family History  Problem Relation Age of Onset   Kidney disease Mother        Kidney failure   Stroke Mother    Diabetes Mother    Heart disease Father        MI   Arthritis Sister    Cancer Sister        Throat   Heart disease Sister        MI  Diabetes Sister    Prostate cancer Neg Hx    Colon cancer Neg Hx     Social History Social History   Tobacco Use   Smoking status: Former    Current packs/day: 0.00    Average packs/day: 1 pack/day for 8.0 years (8.0 ttl pk-yrs)    Types: Cigarettes    Start date: 63    Quit date: 77    Years since quitting: 67.9   Smokeless  tobacco: Never   Tobacco comments:    quit smoking cigarettes in 1958  Vaping Use   Vaping status: Never Used  Substance Use Topics   Alcohol use: No    Alcohol/week: 0.0 standard drinks of alcohol   Drug use: No     Allergies   Brilinta  [ticagrelor ] and Nsaids   Review of Systems Review of Systems  Per HPI  Physical Exam Triage Vital Signs ED Triage Vitals [09/28/24 1920]  Encounter Vitals Group     BP (!) 91/57     Girls Systolic BP Percentile      Girls Diastolic BP Percentile      Boys Systolic BP Percentile      Boys Diastolic BP Percentile      Pulse Rate 90     Resp 16     Temp 97.8 F (36.6 C)     Temp Source Oral     SpO2 96 %     Weight      Height      Head Circumference      Peak Flow      Pain Score      Pain Loc      Pain Education      Exclude from Growth Chart    No data found.  Updated Vital Signs BP (!) 91/57 (BP Location: Right Arm)   Pulse 90   Temp 97.8 F (36.6 C) (Oral)   Resp 16   SpO2 96%   Visual Acuity Right Eye Distance:   Left Eye Distance:   Bilateral Distance:    Right Eye Near:   Left Eye Near:    Bilateral Near:     Physical Exam Vitals and nursing note reviewed.  Constitutional:      General: He is awake. He is not in acute distress.    Appearance: Normal appearance. He is well-developed and well-groomed. He is not ill-appearing.  HENT:     Head: No raccoon eyes, Battle's sign, abrasion or contusion.     Comments: No obvious signs of head injury at this time. Skin:    General: Skin is warm and dry.     Findings: Wound present.         Comments: Large skin tears noted to right shoulder and around right elbow.  Neurological:     General: No focal deficit present.     Mental Status: He is alert and oriented to person, place, and time. Mental status is at baseline.     GCS: GCS eye subscore is 4. GCS verbal subscore is 5. GCS motor subscore is 6.     Cranial Nerves: Cranial nerves 2-12 are intact.      Sensory: Sensation is intact.     Motor: Motor function is intact.     Coordination: Coordination is intact.     Gait: Gait is intact.  Psychiatric:        Behavior: Behavior is cooperative.      UC Treatments / Results  Labs (all labs ordered are  listed, but only abnormal results are displayed) Labs Reviewed - No data to display  EKG   Radiology No results found.  Procedures Procedures (including critical care time)  Medications Ordered in UC Medications - No data to display  Initial Impression / Assessment and Plan / UC Course  I have reviewed the triage vital signs and the nursing notes.  Pertinent labs & imaging results that were available during my care of the patient were reviewed by me and considered in my medical decision making (see chart for details).     Patient appears to be generally weak and is currently in a wheelchair at this time.  Blood pressure is low at 91/57 in clinic.  Multiple skin tears noted on exam.  No neurodeficits noted at this time.  GCS 15.  Recommended patient be seen in the emergency department due to unwitnessed fall on blood thinners and possibility of hitting his head with a combination of hypotension in clinic today.  Patient and family understanding of plan at this time.  [facility] called for transport due to generalized weakness and hypotension and family feeling unsafe for transport to the ER via POV. Final Clinical Impressions(s) / UC Diagnoses   Final diagnoses:  Unwitnessed fall  Multiple skin tears  Hypotension, unspecified hypotension type     Discharge Instructions      We called an ambulance to transport him to the hospital for further evaluation.    ED Prescriptions   None    PDMP not reviewed this encounter.   Johnie Flaming A, NP 09/28/24 1944

## 2024-09-28 NOTE — Discharge Instructions (Addendum)
 It was a pleasure caring for you today in the emergency department.  Please follow up closely with your PCP and with wound care center  Based on the events which brought you to the ER today, it is possible that you may have a concussion. A concussion occurs when there is a blow to the head or body, with enough force to shake the brain and disrupt how the brain functions. You may experience symptoms such as headaches, sensitivity to light/noise, dizziness, cognitive slowing, difficulty concentrating / remembering, trouble sleeping and drowsiness. These symptoms may last anywhere from hours/days to potentially weeks/months. While these symptoms are very frustrating and perhaps debilitating, it is important that you remember that they will improve over time. Everyone has a different rate of recovery; it is difficult to predict when your symptoms will resolve. In order to allow for your brain to heal after the injury, we recommend that you see your primary physician or a physician knowledgeable in concussion management. We also advise you to let your body and brain rest: avoid physical activities (sports, gym, and exercise) and reduce cognitive demands (reading, texting, TV watching, computer use, video games, etc). School attendance, after-school activities and work may need to be modified to avoid increasing symptoms. We recommend against driving until until all symptoms have resolved. Come back to the ER right away if you are having repeated episodes of vomiting, severe/worsening headache/dizziness or any other symptom that alarms you. We recommended that someone stay with you for the next 24 hours to monitor for these worrisome symptoms.  Please return to the emergency department for any worsening or worrisome symptoms.

## 2024-09-28 NOTE — ED Provider Notes (Signed)
 [place] EMERGENCY DEPARTMENT AT [facility] Provider Note  CSN: [id number] Arrival date & time: 09/28/24 2010  Chief Complaint(s) No chief complaint on file.  HPI [name] is a 89 y.o. male with past medical history as below, significant for A-fib, CAD, CHF, CKD, DM who presents to the ED with complaint of fall, head injury, level 2 trauma  Patient was walking to the restroom, tripped over his feet and fell down to the ground.  Hit his head on the ground.  Denies LOC was having difficulty getting up from the ground.  He was helped to stand and was able to go to the restroom, ambulated couple steps.  Denies LOC, he is anticoagulated.  He has a mild headache, wound on his right shoulder otherwise denies any other acute symptoms.  Denies chest pain or abdominal pain, no nausea or vomiting, no dyspnea.  Unsure last tetanus  Sent from urgent care for evaluation  Past Medical History Past Medical History:  Diagnosis Date   Age-related macular degeneration, wet, both eyes ([name])    Anemia    Secondary to acute blood loss   Anginal pain    last chest pain in Feb 2021   Arthritis    mild in hands, knees, ankles (11/13/2015)   Atrial fibrillation ([name])    Consideration was given for atrial flutter ablation, but patient developed atrial fibrillation. Cardioversion was done. Dr. Fernande decided to watch him clinically. November, 2011   Atrial flutter [facility]) 07/2010   September, 2011   Hospital with PNA and cath done...Coumadin .  Atrial flutter ablation planned, but  pt. then had atrial fibrillation,/outpatient conversion 09/08/10..NSR..plan to follow..Dr. Fernande   CAD (coronary artery disease)    Catheterization, September, 2011,  grafts patent from redo CABG,, medical therapy of coronary disease, consideration to proceeding with atrial flutter ablation   Carotid artery disease    Doppler 09/18/2009 - 49% bilateral stenoses   Carotid artery disease    49% bilateral, Doppler,  November, 2010   CHF (congestive heart failure) ([name])    Chronic kidney disease (CKD), stage III (moderate) ([name])    Diabetic peripheral neuropathy ([name])    feet   Diverticulosis of colon with hemorrhage 2009   several unit diverticular bleed    Erectile dysfunction    Mild   Gout    Hearing loss    wears hearing aids   History of blood transfusion several   related to diverticular bleeding   Hyperlipidemia    Hypertension    Mitral regurgitation    Mild, echo, September, 2011   NSTEMI (non-ST elevated myocardial infarction) ([name]) 07/2010   at [facility] with repeat cath, rec medical mgmt    OSA on CPAP    uses CPAP nightly   Personal history of colonic polyps    PNA (pneumonia) 07/2010   NSTEMI at [facility] with repeat cath, rec medical mgmt    RBBB (right bundle branch block)    S/P mitral valve repair 02/06/2020   s/p TEER with a single MitraClip NTW placed on A2/P2   Shoulder pain    positional; better now (11/13/2015)   Skin cancer    R lower leg, per derm 2012   Type II diabetes mellitus ([name])    Patient Active Problem List   Diagnosis Date Noted   Acute cough 09/25/2024   Ulcer of toe ([name]) 05/29/2024   Venous stasis ulcers ([name]) 05/22/2024   Multiple closed fractures of ribs of right side 04/13/2024  PVD (peripheral vascular disease) 04/13/2024   Acute renal failure superimposed on stage 4 chronic kidney disease ([name]) 04/13/2024   Acute on chronic systolic CHF (congestive heart failure) ([name]) 04/13/2024   Fall 04/12/2024   Urticaria 02/12/2024   Neuropathic pain 02/11/2024   Diabetic neuropathy with neurologic complication ([name]) 02/11/2024   S/P coronary artery stent placement 02/03/2024   Atherosclerosis of native coronary artery of native heart without angina pectoris 02/03/2024   Diastolic congestive heart failure ([name]) 02/01/2024   Chronic atrial fibrillation ([name]) 02/01/2024   Hypoglycemia 02/01/2024   Hypoglycemia associated with diabetes ([name]) 01/31/2024    Chronic systolic CHF (congestive heart failure) ([name]) 01/31/2024   DNR (do not resuscitate) 01/31/2024   Aortic stenosis 12/19/2023   Healthcare maintenance 11/22/2023   Shoulder pain 11/22/2023   Prolonged QT interval 08/18/2023   Gastritis and gastroduodenitis 08/18/2023   Duodenal ulcer 08/18/2023   Bilateral lower extremity edema 03/01/2023   Skin lesion 11/06/2022   Laceration of right lower extremity 11/25/2021   Rhinitis 11/01/2020   Non-rheumatic mitral regurgitation 02/06/2020   H/O mitral valve repair 02/06/2020   Permanent atrial fibrillation ([name])    CAD (coronary artery disease) 08/02/2018   Macular degeneration 06/24/2018   Abdominal wall hernia 05/24/2017   History of lower GI bleeding    Pulmonary hypertension ([name])    HFrEF (heart failure with reduced ejection fraction) ([name]) 11/20/2015   Anemia    Gout 09/09/2014   Advance care planning 09/09/2014   Chronic anticoagulation    RBBB (right bundle branch block)    Carotid artery disease    Hx of CABG    CKD (chronic kidney disease) stage 4, GFR 15-29 ml/min ([name]) 03/24/2011   Peripheral sensory neuropathy due to type 2 diabetes mellitus ([name])    GERD    Sleep apnea    HLD (hyperlipidemia)    HTN (hypertension)    Home Medication(s) Prior to Admission medications   Medication Sig Start Date End Date Taking? Authorizing Provider  acetaminophen  (TYLENOL ) 325 MG tablet Take 1 tablet (325 mg total) by mouth every 6 (six) hours as needed for mild pain (pain score 1-3) (or Fever >/= 101). 05/21/24   Jeremiah Arlyss RAMAN, MD  allopurinol  (ZYLOPRIM ) 100 MG tablet Take 0.5 tablets (50 mg total) by mouth every Monday, Wednesday, and Friday. 04/03/24   Jeremiah Arlyss RAMAN, MD  amoxicillin -clavulanate (AUGMENTIN ) 875-125 MG tablet Take 1 tablet by mouth 2 (two) times daily. 09/25/24   [name], Jeremiah E, MD  azithromycin (ZITHROMAX) 250 MG tablet 2 tab po x 1 day then 1 tab po daily 09/25/24   [name], Jeremiah E, MD  Cholecalciferol   (VITAMIN D ) 1000 UNITS capsule Take 1,000 Units by mouth at bedtime.    [provider]  ELIQUIS  2.5 MG TABS tablet TAKE 1 TABLET TWICE A DAY 12/04/23   Court Dorn PARAS, MD  fish oil-omega-3 fatty acids  1000 MG capsule Take 1 g by mouth at bedtime.    [provider]  glucose blood (FREESTYLE LITE) test strip USE TO TEST BLOOD SUGAR ONCE DAILY AND AS NEEDED 11/23/23   Jeremiah Arlyss RAMAN, MD  hydrALAZINE  (APRESOLINE ) 25 MG tablet Take 1 tablet (25 mg total) by mouth 2 (two) times daily. 08/20/24   Jeremiah Arlyss RAMAN, MD  insulin  glargine (LANTUS  SOLOSTAR) 100 UNIT/ML Solostar Pen Inject 5 Units into the skin daily. 5 units daily and increase by 1 unit per day as long as AM sugar is above 150.  If AM sugar 100-150,  no change in daily dose.  If AM sugar below 100, then would decrease lantus  dose by 1 unit. 06/03/24   Jeremiah Arlyss RAMAN, MD  Insulin  Pen Needle 29G X MISC Use as needed with insulin  injection 06/09/24   Jeremiah Arlyss RAMAN, MD  ipratropium (ATROVENT ) 0.03 % nasal spray USE 2 SPRAYS IN EACH NOSTRIL TWICE A DAY AS NEEDED FOR RHINITIS 03/08/24   Jeremiah Arlyss RAMAN, MD  isosorbide  mononitrate (IMDUR ) 60 MG 24 hr tablet TAKE ONE AND ONE-HALF TABLETS DAILY 07/17/24   Court Dorn PARAS, MD  JARDIANCE  10 MG TABS tablet Take 10 mg by mouth daily. 03/11/24   [provider]  lidocaine  (LIDODERM ) 5 % Place 1 patch onto the skin daily. Remove & Discard patch within 12 hours or as directed by MD 04/15/24   Christobal Guadalajara, MD  magnesium  oxide (MAG-OX) 400 MG tablet Take 1 tablet (400 mg total) by mouth every other day. 04/13/23   Jeremiah Arlyss RAMAN, MD  metoprolol  succinate (TOPROL -XL) 100 MG 24 hr tablet Take 0.5 tablets (50 mg total) by mouth every Monday, Wednesday, and Friday. 1/2 tab Mon, Wed and Fri 05/27/24   Jeremiah Arlyss RAMAN, MD  nitroGLYCERIN  (NITROSTAT ) 0.4 MG SL tablet DISSOLVE 1 TABLET UNDER THE TONGUE EVERY 5 MINUTES AS NEEDED FOR CHEST PAIN FOR A MAXIMUM OF 3 DOSES 07/01/22   Court Dorn PARAS, MD  polyethylene glycol (MIRALAX  / GLYCOLAX ) 17 g packet Take 17 g by mouth daily.    [provider]  Potassium Gluconate 595 MG CAPS Take 595 mg by mouth in the morning.    [provider]  ranolazine  (RANEXA ) 500 MG 12 hr tablet Take 1 tablet (500 mg total) by mouth 2 (two) times daily. 05/27/24   Jeremiah Arlyss RAMAN, MD  rosuvastatin  (CRESTOR ) 10 MG tablet TAKE 1 TABLET DAILY 02/07/24   Court Dorn PARAS, MD  torsemide  (DEMADEX ) 20 MG tablet Take 1 tablet (20 mg total) by mouth 2 (two) times daily. 05/27/24   Jeremiah Arlyss RAMAN, MD                                                                                                                                    Past Surgical History Past Surgical History:  Procedure Laterality Date   CARDIAC CATHETERIZATION  2006   Nuclear..slight lateral ischemia..medical therapy   CARDIAC CATHETERIZATION  08/04/2010   grafts patent from redo CABG...medical Rx and ablate Atrial flutter (LV not injected)    CARDIOVERSION  ~ 2010   CAROTID ENDARTERECTOMY Right 1994   CATARACT EXTRACTION W/ INTRAOCULAR LENS  IMPLANT, BILATERAL Bilateral    CHOLECYSTECTOMY     COLON RESECTION N/A 01/13/2016   Procedure: EXPLORATORY LAPAROTOMY, LEFT AND SIGMOID COLON REMOVAL;  Surgeon: Lynda Leos, MD;  Location: [facility] OR;  Service: General;  Laterality: N/A;  Extended open left hemicolectomy and sigmoidectomy    COLONOSCOPY N/A 11/14/2015   Procedure:  COLONOSCOPY;  Surgeon: Elspeth Deward Naval, MD;  Location: [facility] ENDOSCOPY;  Service: Gastroenterology;  Laterality: N/A;   COLONOSCOPY Left 01/05/2016   Procedure: COLONOSCOPY;  Surgeon: Elspeth Deward Naval, MD;  Location: [facility] ENDOSCOPY;  Service: Gastroenterology;  Laterality: Left;  no sedation to start, moderate if needed   COLONOSCOPY W/ POLYPECTOMY     CORONARY ARTERY BYPASS GRAFT  1995; 2006   X 3; X3   CORONARY ARTERY BYPASS GRAFT  1995, 2006   CORONARY STENT INTERVENTION N/A 02/02/2024   Procedure:  CORONARY STENT INTERVENTION;  Surgeon: Wonda Sharper, MD;  Location: [facility] INVASIVE CV LAB;  Service: Cardiovascular;  Laterality: N/A;   DOPPLER ECHOCARDIOGRAPHY  08/2008   EF 60%   DOPPLER ECHOCARDIOGRAPHY  08/02/2010   65-70%   DOPPLER ECHOCARDIOGRAPHY  07/2010   MR mild   ESOPHAGOGASTRODUODENOSCOPY N/A 06/19/2014   Procedure: ESOPHAGOGASTRODUODENOSCOPY (EGD);  Surgeon: Gordy CHRISTELLA Starch, MD;  Location: [facility] ENDOSCOPY;  Service: Endoscopy;  Laterality: N/A;   ESOPHAGOGASTRODUODENOSCOPY N/A 11/14/2015   Procedure: ESOPHAGOGASTRODUODENOSCOPY (EGD);  Surgeon: Elspeth Deward Naval, MD;  Location: [facility] ENDOSCOPY;  Service: Gastroenterology;  Laterality: N/A;   ESOPHAGOGASTRODUODENOSCOPY (EGD) WITH PROPOFOL  N/A 08/18/2023   Procedure: ESOPHAGOGASTRODUODENOSCOPY (EGD) WITH PROPOFOL ;  Surgeon: San Sandor GAILS, DO;  Location: [facility] ENDOSCOPY;  Service: Gastroenterology;  Laterality: N/A;   FLEXIBLE SIGMOIDOSCOPY N/A 11/16/2015   Procedure: FLEXIBLE SIGMOIDOSCOPY;  Surgeon: Elspeth Deward Naval, MD;  Location: [facility] ENDOSCOPY;  Service: Gastroenterology;  Laterality: N/A;   LAPAROSCOPIC CHOLECYSTECTOMY  2008   LEFT HEART CATH N/A 06/14/2014   Procedure: LEFT HEART CATH;  Surgeon: Victory LELON Claudene DOUGLAS, MD;  Location: [facility] CATH LAB;  Service: Cardiovascular;  Laterality: N/A;   LEFT HEART CATH AND CORONARY ANGIOGRAPHY N/A 08/02/2018   Procedure: LEFT HEART CATH AND CORONARY ANGIOGRAPHY;  Surgeon: Court Dorn PARAS, MD;  Location: [facility] INVASIVE CV LAB;  Service: Cardiovascular;  Laterality: N/A;   LEFT HEART CATHETERIZATION WITH CORONARY/GRAFT ANGIOGRAM N/A 06/11/2014   Procedure: LEFT HEART CATHETERIZATION WITH EL BILE;  Surgeon: Victory LELON Claudene DOUGLAS, MD;  Location: [facility] CATH LAB;  Service: Cardiovascular;  Laterality: N/A;   PERCUTANEOUS CORONARY STENT INTERVENTION (PCI-S) N/A 06/13/2014   Procedure: PERCUTANEOUS CORONARY STENT INTERVENTION (PCI-S);  Surgeon: Victory LELON Claudene DOUGLAS, MD;  Location: [facility] CATH LAB;   Service: Cardiovascular;  Laterality: N/A;   RIGHT HEART CATH N/A 01/06/2020   Procedure: RIGHT HEART CATH;  Surgeon: Cherrie Toribio SAUNDERS, MD;  Location: [facility] INVASIVE CV LAB;  Service: Cardiovascular;  Laterality: N/A;   RIGHT/LEFT HEART CATH AND CORONARY/GRAFT ANGIOGRAPHY N/A 02/02/2024   Procedure: RIGHT/LEFT HEART CATH AND CORONARY/GRAFT ANGIOGRAPHY;  Surgeon: Wonda Sharper, MD;  Location: [facility] INVASIVE CV LAB;  Service: Cardiovascular;  Laterality: N/A;   SKIN CANCER EXCISION Left 10/2015   calf   SKIN CANCER EXCISION Right 2014?   chest   TEE WITHOUT CARDIOVERSION N/A 01/06/2020   Procedure: TRANSESOPHAGEAL ECHOCARDIOGRAM (TEE);  Surgeon: Cherrie Toribio SAUNDERS, MD;  Location: [facility] ENDOSCOPY;  Service: Cardiovascular;  Laterality: N/A;   TONSILLECTOMY     TRANSCATHETER MITRAL EDGE TO EDGE REPAIR N/A 02/06/2020   Procedure: MITRAL VALVE REPAIR;  Surgeon: Wonda Sharper, MD;  Location: [facility] INVASIVE CV LAB;  Service: Cardiovascular;  Laterality: N/A;   VASECTOMY     Family History Family History  Problem Relation Age of Onset   Kidney disease Mother        Kidney failure   Stroke Mother    Diabetes Mother    Heart disease Father  MI   Arthritis Sister    Cancer Sister        Throat   Heart disease Sister        MI   Diabetes Sister    Prostate cancer Neg Hx    Colon cancer Neg Hx     Social History Social History   Tobacco Use   Smoking status: Former    Current packs/day: 0.00    Average packs/day: 1 pack/day for 8.0 years (8.0 ttl pk-yrs)    Types: Cigarettes    Start date: 84    Quit date: 2    Years since quitting: 67.9   Smokeless tobacco: Never   Tobacco comments:    quit smoking cigarettes in 1958  Vaping Use   Vaping status: Never Used  Substance Use Topics   Alcohol use: No    Alcohol/week: 0.0 standard drinks of alcohol   Drug use: No   Allergies Brilinta  [ticagrelor ] and Nsaids  Review of Systems A thorough review of systems was obtained  and all systems are negative except as noted in the HPI and PMH.   Physical Exam Vital Signs  I have reviewed the triage vital signs BP 130/89   Pulse 65   Temp 98.1 F (36.7 C) (Oral)   Resp 18   Ht 5' 7 (1.702 m)   Wt 65.8 kg   SpO2 97%   BMI 22.71 kg/m  Physical Exam Vitals and nursing note reviewed.  Constitutional:      General: He is not in acute distress.    Appearance: He is well-developed.  HENT:     Head: Normocephalic and atraumatic.     Right Ear: External ear normal.     Left Ear: External ear normal.     Mouth/Throat:     Mouth: Mucous membranes are moist.  Eyes:     General: No scleral icterus.    Extraocular Movements: Extraocular movements intact.     Pupils: Pupils are equal, round, and reactive to light.  Cardiovascular:     Rate and Rhythm: Normal rate and regular rhythm.     Pulses: Normal pulses.     Heart sounds: Normal heart sounds.  Pulmonary:     Effort: Pulmonary effort is normal. No respiratory distress.     Breath sounds: Normal breath sounds.  Abdominal:     General: Abdomen is flat.     Palpations: Abdomen is soft.     Tenderness: There is no abdominal tenderness.  Musculoskeletal:     Cervical back: No rigidity.     Right lower leg: No edema.     Left lower leg: No edema.     Comments: Full range of motion to upper and lower extremities, extremities are NVI.  Pelvis stable to AP pressure  Able to lift both legs off the bed independently without significant discomfort  Skin:    General: Skin is warm and dry.     Capillary Refill: Capillary refill takes less than 2 seconds.         Comments:    Neurological:     Mental Status: He is alert and oriented to person, place, and time.     GCS: GCS eye subscore is 4. GCS verbal subscore is 5. GCS motor subscore is 6.     Cranial Nerves: Cranial nerves 2-12 are intact.     Sensory: Sensation is intact.     Motor: Motor function is intact.     Coordination: Coordination is intact.  Comments: Gait testing deferred secondary to patient safety.  Strength 5/5 to BLUE/BLLE, equal and symmetric    Psychiatric:        Mood and Affect: Mood normal.        Behavior: Behavior normal.     ED Results and Treatments Labs (all labs ordered are listed, but only abnormal results are displayed) Labs Reviewed - No data to display                                                                                                                        Radiology CT CERVICAL SPINE WO CONTRAST Result Date: 09/28/2024 CLINICAL DATA:  Status post trauma. EXAM: CT CERVICAL SPINE WITHOUT CONTRAST TECHNIQUE: Multidetector CT imaging of the cervical spine was performed without intravenous contrast. Multiplanar CT image reconstructions were also generated. RADIATION DOSE REDUCTION: This exam was performed according to the departmental dose-optimization program which includes automated exposure control, adjustment of the mA and/or kV according to patient size and/or use of iterative reconstruction technique. COMPARISON:  Apr 12, 2024 FINDINGS: Alignment: There is chronic reversal of the normal cervical spine lordosis. There is approximately 1 mm stepwise retrolisthesis of C5 on C6, C6 on C7 and C7 on T1. Skull base and vertebrae: No acute fracture. No primary bone lesion or focal pathologic process. Soft tissues and spinal canal: No prevertebral fluid or swelling. No visible canal hematoma. Disc levels: Marked severity endplate sclerosis, anterior osteophyte formation and posterior bony spurring are seen at the levels of C3-C4, C4-C5, C5-C6 and C6-C7. There is marked severity intervertebral disc space narrowing at the levels of C3-C4, C4-C5, C5-C6 and C6-C7. Bilateral marked severity multilevel facet joint hypertrophy is noted. Upper chest: There is a large, stable right-sided pleural effusion with mild airspace disease noted within the bilateral upper lobes. Other: There is marked severity atherosclerotic  calcification of the bilateral carotid artery bifurcations. IMPRESSION: 1. No acute fracture or traumatic subluxation of the cervical spine. 2. Marked severity multilevel degenerative changes, as described above. 3. Large, stable right-sided pleural effusion with mild bilateral upper lobe airspace disease. Electronically Signed   By: Suzen Dials M.D.   On: 09/28/2024 21:06   CT HEAD WO CONTRAST Result Date: 09/28/2024 CLINICAL DATA:  Status post trauma. EXAM: CT HEAD WITHOUT CONTRAST TECHNIQUE: Contiguous axial images were obtained from the base of the skull through the vertex without intravenous contrast. RADIATION DOSE REDUCTION: This exam was performed according to the departmental dose-optimization program which includes automated exposure control, adjustment of the mA and/or kV according to patient size and/or use of iterative reconstruction technique. COMPARISON:  Apr 12, 2024 FINDINGS: Brain: There is generalized cerebral atrophy with widening of the extra-axial spaces and ventricular dilatation. There are areas of decreased attenuation within the white matter tracts of the supratentorial brain, consistent with microvascular disease changes. Vascular: Marked severity bilateral cavernous carotid artery calcification is noted. Skull: Normal. Negative for fracture or focal lesion. Sinuses/Orbits: No acute finding. Other: None. IMPRESSION: 1. No acute  intracranial abnormality. 2. Generalized cerebral atrophy and microvascular disease changes of the supratentorial brain. Electronically Signed   By: Suzen Dials M.D.   On: 09/28/2024 21:01   DG Pelvis Portable Result Date: 09/28/2024 EXAM: 1 or 2 VIEW(S) XRAY OF THE PELVIS 09/28/2024 08:26:00 PM COMPARISON: X-ray pelvis 04/12/2024. CLINICAL HISTORY: Trauma. FINDINGS: BONES AND JOINTS: Degenerative changes of the visualized lower lumbar spine. No acute fracture or dislocation of either hips. No acute fracture or dislocation of the bones of the  pelvis. No focal osseous lesion. SOFT TISSUES: Severe atherosclerotic plaque. IMPRESSION: 1. No acute traumatic findings. 2. Severe atherosclerotic plaque. Electronically signed by: Morgane Naveau MD 09/28/2024 08:35 PM EST RP Workstation: [id number]   DG Chest Port 1 View Result Date: 09/28/2024 EXAM: 1 VIEW(S) XRAY OF THE CHEST 09/28/2024 08:26:00 PM COMPARISON: Chest x-ray 09/25/2024. CLINICAL HISTORY: Trauma Trauma FINDINGS: LUNGS AND PLEURA: Persitent at least small volume right pleural effusion. No left pleural effusion. Right base atelectasis versus consolidation associated with the effusion. No pneumothorax. HEART AND MEDIASTINUM: Vascular stent noted overlying the mediastinum. Atherosclerotic plaque. Grossly unchanged cardiomediastinal silhouette. BONES AND SOFT TISSUES: No acute osseous abnormality. IMPRESSION: 1. Persistent at least small right pleural effusion. Right basilar atelectasis versus consolidation associated with the effusion. Electronically signed by: Morgane Naveau MD 09/28/2024 08:34 PM EST RP Workstation: [id number]    Pertinent labs & imaging results that were available during my care of the patient were reviewed by me and considered in my medical decision making (see MDM for details).  Medications Ordered in ED Medications  Tdap (ADACEL) injection 0.5 mL (0.5 mLs Intramuscular Given 09/28/24 2206)                                                                                                                                     Procedures Procedures  (including critical care time)  Medical Decision Making / ED Course    Medical Decision Making:    MIKHI ATHEY is a 89 y.o. male with past medical history as below, significant for A-fib, CAD, CHF, CKD, DM who presents to the ED with complaint of fall, head injury, level 2 trauma. The complaint involves an extensive differential diagnosis and also carries with it a high risk of complications and morbidity.  Serious  etiology was considered. Ddx includes but is not limited to: Differential diagnoses for head trauma includes subdural hematoma, epidural hematoma, acute concussion, traumatic subarachnoid hemorrhage, cerebral contusions, etc.   Complete initial physical exam performed, notably the patient was in NAD.    Reviewed and confirmed nursing documentation for past medical history, family history, social history.  Vital signs reviewed.    Level 2 trauma, FOT w/ head inj Skin tear > - Primary survey was completed, airway intact, clear breath sounds bilateral, she was warm well-perfused and equal pulses to extremities.  GCS 15, A and O x 3, pupils 4 mm reactive bilateral.  He has  skin tear to right upper extremity. - concern by UC for hypotension, pt was normotensive on EMS arrival and has been normotensive while in ED - XR stable - CT stable, stable pleural eff noted, currently receiving tx for pna through PCP, no resp complaints today, no fever - EKG afib, know hx - wound care, tetanus updated - workup stable, feeling much better, back to baseline - wound care supplies for home - concussion precautions d/w wife and pt   I have discussed the diagnosis/risks/treatment options with the patient and family.  Evaluation and diagnostic testing in the emergency department does not suggest an emergent condition requiring admission or immediate intervention beyond what has been performed at this time.  They will follow up with pcp. We also discussed returning to the ED immediately if new or worsening sx occur. We discussed the sx which are most concerning (e.g., sudden worsening pain, fever, inability to tolerate by mouth, severe ha, behavior change) that necessitate immediate return.    The patient appears reasonably screened and/or stabilized for discharge and I doubt any other medical condition or other [facility] requiring further screening, evaluation, or treatment in the ED at this time prior to discharge.                       Additional history obtained: -Additional history obtained from family and ems -External records from outside source obtained and reviewed including: Chart review including previous notes, labs, imaging, consultation notes including  Allergies Recent uc note   Lab Tests: na  EKG   EKG Interpretation Date/Time:    Ventricular Rate:    PR Interval:    QRS Duration:    QT Interval:    QTC Calculation:   R Axis:      Text Interpretation:           Imaging Studies ordered: I ordered imaging studies including cth ct cervical, xr chest/pelv I independently visualized the following imaging with scope of interpretation limited to determining acute life threatening conditions related to emergency care; findings noted above I agree with the radiologist interpretation If any imaging was obtained with contrast I closely monitored patient for any possible adverse reaction a/w contrast administration in the emergency department   Medicines ordered and prescription drug management: Meds ordered this encounter  Medications   Tdap (ADACEL) injection 0.5 mL    -I have reviewed the patients home medicines and have made adjustments as needed   Consultations Obtained: na   Cardiac Monitoring: The patient was maintained on a cardiac monitor.  I personally viewed and interpreted the cardiac monitored which showed an underlying rhythm of: nsr Continuous pulse oximetry interpreted by myself, 99% on RA.    Social Determinants of Health:  Diagnosis or treatment significantly limited by social determinants of health: former smoker   Reevaluation: After the interventions noted above, I reevaluated the patient and found that they have improved  Co morbidities that complicate the patient evaluation  Past Medical History:  Diagnosis Date   Age-related macular degeneration, wet, both eyes ([name])    Anemia    Secondary to acute blood loss   Anginal pain     last chest pain in Feb 2021   Arthritis    mild in hands, knees, ankles (11/13/2015)   Atrial fibrillation ([name])    Consideration was given for atrial flutter ablation, but patient developed atrial fibrillation. Cardioversion was done. Dr. Fernande decided to watch him clinically. November, 2011   Atrial flutter (  [name]) 07/2010   September, 2011   Hospital with PNA and cath done...Coumadin .  Atrial flutter ablation planned, but  pt. then had atrial fibrillation,/outpatient conversion 09/08/10..NSR..plan to follow..Dr. Fernande   CAD (coronary artery disease)    Catheterization, September, 2011,  grafts patent from redo CABG,, medical therapy of coronary disease, consideration to proceeding with atrial flutter ablation   Carotid artery disease    Doppler 09/18/2009 - 49% bilateral stenoses   Carotid artery disease    49% bilateral, Doppler, November, 2010   CHF (congestive heart failure) ([name])    Chronic kidney disease (CKD), stage III (moderate) ([name])    Diabetic peripheral neuropathy ([name])    feet   Diverticulosis of colon with hemorrhage 2009   several unit diverticular bleed    Erectile dysfunction    Mild   Gout    Hearing loss    wears hearing aids   History of blood transfusion several   related to diverticular bleeding   Hyperlipidemia    Hypertension    Mitral regurgitation    Mild, echo, September, 2011   NSTEMI (non-ST elevated myocardial infarction) ([name]) 07/2010   at [facility] with repeat cath, rec medical mgmt    OSA on CPAP    uses CPAP nightly   Personal history of colonic polyps    PNA (pneumonia) 07/2010   NSTEMI at [facility] with repeat cath, rec medical mgmt    RBBB (right bundle branch block)    S/P mitral valve repair 02/06/2020   s/p TEER with a single MitraClip NTW placed on A2/P2   Shoulder pain    positional; better now (11/13/2015)   Skin cancer    R lower leg, per derm 2012   Type II diabetes mellitus ([name])       Dispostion: Disposition decision  including need for hospitalization was considered, and patient discharged from emergency department.    Final Clinical Impression(s) / ED Diagnoses Final diagnoses:  Skin tear of right elbow without complication, initial encounter  Skin tear of right upper arm without complication, initial encounter  Fall, initial encounter  Injury of head, initial encounter        Elnor Jayson LABOR, DO 09/29/24 0033

## 2024-09-28 NOTE — ED Notes (Signed)
 Patient transported to CT

## 2024-10-01 ENCOUNTER — Ambulatory Visit: Admitting: Family Medicine

## 2024-10-01 ENCOUNTER — Encounter: Payer: Self-pay | Admitting: Family Medicine

## 2024-10-01 VITALS — BP 118/78 | HR 57 | Temp 98.4°F | Ht 67.0 in | Wt 144.2 lb

## 2024-10-01 DIAGNOSIS — E1149 Type 2 diabetes mellitus with other diabetic neurological complication: Secondary | ICD-10-CM

## 2024-10-01 DIAGNOSIS — Z7984 Long term (current) use of oral hypoglycemic drugs: Secondary | ICD-10-CM

## 2024-10-01 DIAGNOSIS — S41111A Laceration without foreign body of right upper arm, initial encounter: Secondary | ICD-10-CM

## 2024-10-01 DIAGNOSIS — R051 Acute cough: Secondary | ICD-10-CM

## 2024-10-01 DIAGNOSIS — S51011A Laceration without foreign body of right elbow, initial encounter: Secondary | ICD-10-CM

## 2024-10-01 DIAGNOSIS — S20412A Abrasion of left back wall of thorax, initial encounter: Secondary | ICD-10-CM

## 2024-10-01 DIAGNOSIS — S20411A Abrasion of right back wall of thorax, initial encounter: Secondary | ICD-10-CM | POA: Diagnosis not present

## 2024-10-01 DIAGNOSIS — T148XXA Other injury of unspecified body region, initial encounter: Secondary | ICD-10-CM

## 2024-10-01 DIAGNOSIS — Z794 Long term (current) use of insulin: Secondary | ICD-10-CM | POA: Diagnosis not present

## 2024-10-01 MED ORDER — LANTUS SOLOSTAR 100 UNIT/ML ~~LOC~~ SOPN: 11.0000 [IU] | PEN_INJECTOR | Freq: Every day | SUBCUTANEOUS | Status: DC

## 2024-10-01 MED ORDER — METOPROLOL SUCCINATE ER 50 MG PO TB24: 50.0000 mg | ORAL_TABLET | ORAL | 3 refills | Status: DC

## 2024-10-01 MED ORDER — METOPROLOL SUCCINATE ER 50 MG PO TB24: 50.0000 mg | ORAL_TABLET | ORAL | 3 refills | Status: AC

## 2024-10-01 MED ORDER — LANTUS SOLOSTAR 100 UNIT/ML ~~LOC~~ SOPN: 13.0000 [IU] | PEN_INJECTOR | Freq: Every day | SUBCUTANEOUS | Status: DC

## 2024-10-01 NOTE — Progress Notes (Unsigned)
 Recently evaluated by Dr. Avelina and started on antibiotics due to concern for pneumonia.  He had an episode where he fell after his clinic office visit and had ER evaluation.  Multiple skin tears.  They have been dressed in the meantime.  Discussed with patient about recent imaging.  Done with Zithromax.  Still on Augmentin .  He is not having fevers.  Still with some cough.    Due for follow-up labs regarding diabetes.  Sugars usually been around 150 or lower on home check.  He has been taking insulin  12 to 13 units daily.  Still on Jardiance .  He is still anticoagulated.  Meds, vitals, and allergies reviewed.   ROS: Per HPI unless specifically indicated in ROS section   [place]ad [place]ck supple, no LA IRR with SEM.   Decreased breath sounds at the right lower lobe but no rhonchi or rales otherwise.  No increased work of breathing.  Multiple skin tears noted.  He has a 14 x 4 cm tear on the right upper extremity, near the shoulder, laterally.  He also has a 2 x 2 tear near the right elbow.  He has 2 small abrasions on his bilateral lower back.  His lower extremities are in compression stockings/dressings at baseline.  The skin tears described above were checked at the office visit.  I removed all bandages and redressed them with nonstick bandages and Coban.  None of the skin tears appeared infected.  None were actively bleeding.  He does have granulation tissue at the skin tears.  45 minutes were devoted to patient care in this encounter (this includes time spent reviewing the patient's file/history, interviewing and examining the patient, counseling/reviewing plan with patient).

## 2024-10-01 NOTE — Patient Instructions (Signed)
 We'll update you about your labs.  I'll check your med options when I get the results.  Keep the scrapes clean and covered.  Take care.  Glad to see you.

## 2024-10-02 ENCOUNTER — Encounter (HOSPITAL_BASED_OUTPATIENT_CLINIC_OR_DEPARTMENT_OTHER): Admitting: Internal Medicine

## 2024-10-02 ENCOUNTER — Telehealth: Payer: Self-pay

## 2024-10-02 DIAGNOSIS — S41101A Unspecified open wound of right upper arm, initial encounter: Secondary | ICD-10-CM | POA: Diagnosis not present

## 2024-10-02 DIAGNOSIS — S31000A Unspecified open wound of lower back and pelvis without penetration into retroperitoneum, initial encounter: Secondary | ICD-10-CM

## 2024-10-02 DIAGNOSIS — T148XXA Other injury of unspecified body region, initial encounter: Secondary | ICD-10-CM | POA: Insufficient documentation

## 2024-10-02 DIAGNOSIS — L97818 Non-pressure chronic ulcer of other part of right lower leg with other specified severity: Secondary | ICD-10-CM | POA: Diagnosis not present

## 2024-10-02 DIAGNOSIS — I87331 Chronic venous hypertension (idiopathic) with ulcer and inflammation of right lower extremity: Secondary | ICD-10-CM | POA: Diagnosis not present

## 2024-10-02 DIAGNOSIS — E1151 Type 2 diabetes mellitus with diabetic peripheral angiopathy without gangrene: Secondary | ICD-10-CM | POA: Diagnosis not present

## 2024-10-02 DIAGNOSIS — E1149 Type 2 diabetes mellitus with other diabetic neurological complication: Secondary | ICD-10-CM | POA: Insufficient documentation

## 2024-10-02 DIAGNOSIS — E11621 Type 2 diabetes mellitus with foot ulcer: Secondary | ICD-10-CM | POA: Diagnosis not present

## 2024-10-02 DIAGNOSIS — L97518 Non-pressure chronic ulcer of other part of right foot with other specified severity: Secondary | ICD-10-CM | POA: Diagnosis not present

## 2024-10-02 LAB — COMPREHENSIVE METABOLIC PANEL WITH GFR
ALT: 27 U/L (ref 0–53)
AST: 24 U/L (ref 0–37)
Albumin: 3.9 g/dL (ref 3.5–5.2)
Alkaline Phosphatase: 101 U/L (ref 39–117)
BUN: 87 mg/dL (ref 6–23)
CO2: 28 meq/L (ref 19–32)
Calcium: 9.9 mg/dL (ref 8.4–10.5)
Chloride: 98 meq/L (ref 96–112)
Creatinine, Ser: 2.75 mg/dL — ABNORMAL HIGH (ref 0.40–1.50)
GFR: 19.67 mL/min — ABNORMAL LOW (ref >60.00)
Glucose, Bld: 155 mg/dL — ABNORMAL HIGH (ref 70–99)
Potassium: 4.7 meq/L (ref 3.5–5.1)
Sodium: 136 meq/L (ref 135–145)
Total Bilirubin: 0.8 mg/dL (ref 0.2–1.2)
Total Protein: 7.7 g/dL (ref 6.0–8.3)

## 2024-10-02 LAB — CBC WITH DIFFERENTIAL/PLATELET
Basophils Absolute: 0 K/uL (ref 0.0–0.1)
Basophils Relative: 0.5 % (ref 0.0–3.0)
Eosinophils Absolute: 0.3 K/uL (ref 0.0–0.7)
Eosinophils Relative: 3.8 % (ref 0.0–5.0)
HCT: 32.1 % — ABNORMAL LOW (ref 39.0–52.0)
Hemoglobin: 10.6 g/dL — ABNORMAL LOW (ref 13.0–17.0)
Lymphocytes Relative: 11.4 % — ABNORMAL LOW (ref 12.0–46.0)
Lymphs Abs: 0.8 K/uL (ref 0.7–4.0)
MCHC: 33 g/dL (ref 30.0–36.0)
MCV: 99.4 fl (ref 78.0–100.0)
Monocytes Absolute: 0.6 K/uL (ref 0.1–1.0)
Monocytes Relative: 8.2 % (ref 3.0–12.0)
Neutro Abs: 5.3 K/uL (ref 1.4–7.7)
Neutrophils Relative %: 76.1 % (ref 43.0–77.0)
Platelets: 165 K/uL (ref 150.0–400.0)
RBC: 3.22 Mil/uL — ABNORMAL LOW (ref 4.22–5.81)
RDW: 16.4 % — ABNORMAL HIGH (ref 11.5–15.5)
WBC: 6.9 K/uL (ref 4.0–10.5)

## 2024-10-02 LAB — HEMOGLOBIN A1C: Hgb A1c MFr Bld: 7.2 % — ABNORMAL HIGH (ref 4.6–6.5)

## 2024-10-02 NOTE — Telephone Encounter (Signed)
  CRITICAL VALUE: BUN [id number]  RECEIVER (on-site recipient of call):Macoy Rodwell LPN  DATE & TIME NOTIFIED: 11:38 AM 10/02/24   MESSENGER (representative from lab):Vikki [facility]EDL[facility]  MD NOTIFIED: Dr. Arlyss Solian who is out of office and Dr Anton Blas who is in office  TIME OF NOTIFICATION:11:38 AM  RESPONSE:

## 2024-10-02 NOTE — Telephone Encounter (Signed)
 Labs overall stable with Hgb 10.5 --> 10.6 and Cr 2.65 --> 2.75 BUN has increased from 40s to 87.  Noted recent fall - r/o dehydration vs rhabdo but kidney function otherwise stable.  Ok to await PCP recommendations.

## 2024-10-02 NOTE — Assessment & Plan Note (Signed)
 He has a stable right-sided pleural effusion with mild bilateral upper lobe airspace disease.  See notes on labs.  Finish Augmentin .  Done with Zithromax.  At this point still okay for outpatient follow-up.

## 2024-10-02 NOTE — Telephone Encounter (Signed)
 Thank you.  I will work on the result notes.

## 2024-10-02 NOTE — Assessment & Plan Note (Signed)
 He can decrease his insulin  by 1 unit/day if needed to avoid hypoglycemia.  See notes on labs.

## 2024-10-02 NOTE — Assessment & Plan Note (Signed)
  The skin tears described above were checked at the office visit.  I removed all bandages and redressed them with nonstick bandages and Coban.  None of the skin tears appeared infected.  None were actively bleeding.  He does have granulation tissue at the skin tears. Continue with dressing changes with nonstick bandages and he can update me as needed.

## 2024-10-03 ENCOUNTER — Ambulatory Visit: Payer: Self-pay | Admitting: Family Medicine

## 2024-10-03 DIAGNOSIS — I872 Venous insufficiency (chronic) (peripheral): Secondary | ICD-10-CM | POA: Diagnosis not present

## 2024-10-03 DIAGNOSIS — I130 Hypertensive heart and chronic kidney disease with heart failure and stage 1 through stage 4 chronic kidney disease, or unspecified chronic kidney disease: Secondary | ICD-10-CM | POA: Diagnosis not present

## 2024-10-03 DIAGNOSIS — E11621 Type 2 diabetes mellitus with foot ulcer: Secondary | ICD-10-CM | POA: Diagnosis not present

## 2024-10-03 DIAGNOSIS — L97811 Non-pressure chronic ulcer of other part of right lower leg limited to breakdown of skin: Secondary | ICD-10-CM | POA: Diagnosis not present

## 2024-10-03 DIAGNOSIS — L97512 Non-pressure chronic ulcer of other part of right foot with fat layer exposed: Secondary | ICD-10-CM | POA: Diagnosis not present

## 2024-10-03 DIAGNOSIS — I5023 Acute on chronic systolic (congestive) heart failure: Secondary | ICD-10-CM | POA: Diagnosis not present

## 2024-10-08 DIAGNOSIS — I872 Venous insufficiency (chronic) (peripheral): Secondary | ICD-10-CM | POA: Diagnosis not present

## 2024-10-08 DIAGNOSIS — L97512 Non-pressure chronic ulcer of other part of right foot with fat layer exposed: Secondary | ICD-10-CM | POA: Diagnosis not present

## 2024-10-08 DIAGNOSIS — I5023 Acute on chronic systolic (congestive) heart failure: Secondary | ICD-10-CM | POA: Diagnosis not present

## 2024-10-08 DIAGNOSIS — L97811 Non-pressure chronic ulcer of other part of right lower leg limited to breakdown of skin: Secondary | ICD-10-CM | POA: Diagnosis not present

## 2024-10-08 DIAGNOSIS — I130 Hypertensive heart and chronic kidney disease with heart failure and stage 1 through stage 4 chronic kidney disease, or unspecified chronic kidney disease: Secondary | ICD-10-CM | POA: Diagnosis not present

## 2024-10-08 DIAGNOSIS — E11621 Type 2 diabetes mellitus with foot ulcer: Secondary | ICD-10-CM | POA: Diagnosis not present

## 2024-10-16 DIAGNOSIS — E11621 Type 2 diabetes mellitus with foot ulcer: Secondary | ICD-10-CM | POA: Diagnosis not present

## 2024-10-16 DIAGNOSIS — I5023 Acute on chronic systolic (congestive) heart failure: Secondary | ICD-10-CM | POA: Diagnosis not present

## 2024-10-16 DIAGNOSIS — L97512 Non-pressure chronic ulcer of other part of right foot with fat layer exposed: Secondary | ICD-10-CM | POA: Diagnosis not present

## 2024-10-16 DIAGNOSIS — I130 Hypertensive heart and chronic kidney disease with heart failure and stage 1 through stage 4 chronic kidney disease, or unspecified chronic kidney disease: Secondary | ICD-10-CM | POA: Diagnosis not present

## 2024-10-16 DIAGNOSIS — I872 Venous insufficiency (chronic) (peripheral): Secondary | ICD-10-CM | POA: Diagnosis not present

## 2024-10-16 DIAGNOSIS — L97811 Non-pressure chronic ulcer of other part of right lower leg limited to breakdown of skin: Secondary | ICD-10-CM | POA: Diagnosis not present

## 2024-10-17 ENCOUNTER — Encounter (HOSPITAL_BASED_OUTPATIENT_CLINIC_OR_DEPARTMENT_OTHER): Attending: Internal Medicine | Admitting: Internal Medicine

## 2024-10-17 DIAGNOSIS — E1151 Type 2 diabetes mellitus with diabetic peripheral angiopathy without gangrene: Secondary | ICD-10-CM | POA: Diagnosis not present

## 2024-10-17 DIAGNOSIS — S41101A Unspecified open wound of right upper arm, initial encounter: Secondary | ICD-10-CM | POA: Diagnosis not present

## 2024-10-17 DIAGNOSIS — X58XXXA Exposure to other specified factors, initial encounter: Secondary | ICD-10-CM | POA: Diagnosis not present

## 2024-10-17 DIAGNOSIS — E11621 Type 2 diabetes mellitus with foot ulcer: Secondary | ICD-10-CM | POA: Insufficient documentation

## 2024-10-17 DIAGNOSIS — L97818 Non-pressure chronic ulcer of other part of right lower leg with other specified severity: Secondary | ICD-10-CM | POA: Insufficient documentation

## 2024-10-17 DIAGNOSIS — L97518 Non-pressure chronic ulcer of other part of right foot with other specified severity: Secondary | ICD-10-CM | POA: Insufficient documentation

## 2024-10-17 DIAGNOSIS — S31000A Unspecified open wound of lower back and pelvis without penetration into retroperitoneum, initial encounter: Secondary | ICD-10-CM | POA: Insufficient documentation

## 2024-10-17 DIAGNOSIS — I87331 Chronic venous hypertension (idiopathic) with ulcer and inflammation of right lower extremity: Secondary | ICD-10-CM | POA: Diagnosis not present

## 2024-10-21 DIAGNOSIS — N184 Chronic kidney disease, stage 4 (severe): Secondary | ICD-10-CM | POA: Diagnosis not present

## 2024-10-29 DIAGNOSIS — I129 Hypertensive chronic kidney disease with stage 1 through stage 4 chronic kidney disease, or unspecified chronic kidney disease: Secondary | ICD-10-CM | POA: Diagnosis not present

## 2024-10-29 DIAGNOSIS — M898X9 Other specified disorders of bone, unspecified site: Secondary | ICD-10-CM | POA: Diagnosis not present

## 2024-10-29 DIAGNOSIS — D631 Anemia in chronic kidney disease: Secondary | ICD-10-CM | POA: Diagnosis not present

## 2024-10-29 DIAGNOSIS — N184 Chronic kidney disease, stage 4 (severe): Secondary | ICD-10-CM | POA: Diagnosis not present

## 2024-10-31 ENCOUNTER — Ambulatory Visit: Payer: Medicare Other

## 2024-10-31 VITALS — BP 118/76 | Ht 67.0 in | Wt 142.4 lb

## 2024-10-31 DIAGNOSIS — Z0000 Encounter for general adult medical examination without abnormal findings: Secondary | ICD-10-CM

## 2024-10-31 NOTE — Progress Notes (Signed)
 Chief Complaint  Patient presents with   Medicare Wellness     Subjective:   [name] is a 89 y.o. male who presents for a Medicare Annual Wellness Visit.  Visit info / Clinical Intake: Medicare Wellness Visit Type:: Subsequent Annual Wellness Visit Persons participating in visit and providing information:: patient Medicare Wellness Visit Mode:: In-person (required for WTM) Interpreter Needed?: No AWV questionnaire completed by patient prior to visit?: no Living arrangements:: (!) lives alone Patient's Overall Health Status Rating: good Typical amount of pain: some Does pain affect daily life?: (!) yes Are you currently prescribed opioids?: no  Dietary Habits and Nutritional Risks How many meals a day?: 2 Eats fruit and vegetables daily?: yes Most meals are obtained by: having others provide food; eating out In the last 2 weeks, have you had any of the following?: (!) nausea, vomiting, diarrhea (one episode this week only of nausea) Diabetic:: (!) yes Any non-healing wounds?: (!) yes (two spots on left  legand one on rt and wound on rt shoulder) How often do you check your BS?: 1; as needed Would you like to be referred to a Nutritionist or for Diabetic Management? : no  Functional Status Activities of Daily Living (to include ambulation/medication): (!) Needs Assist Feeding: Independent with device- listed below Dressing/Grooming: Needs assistance Bathing: Needs assistance Toileting: Independent with device- listed below Transfer: Independent with device- listed below Ambulation: Independent with device- listed below Home Assistive Devices/Equipment: Walker (specify Type) Medication Administration: Needs assistance (comment) Is this a change from baseline?: Pre-admission baseline Home Management (perform basic housework or laundry): Needs assistance (comment) Manage your own finances?: (!) no Primary transportation is: family / friends Concerns about vision?: no  *vision screening is required for WTM* Concerns about hearing?: (!) yes Uses hearing aids?: (!) yes Hear whispered voice?: (!) no *in-person visit only*  Fall Screening Falls in the past year?: 1 Number of falls in past year: 0 Was there an injury with Fall?: 1 Fall Risk Category Calculator: 2 Patient Fall Risk Level: Moderate Fall Risk  Fall Risk Patient at Risk for Falls Due to: Impaired mobility; Impaired balance/gait Fall risk Follow up: Falls evaluation completed; Education provided; Falls prevention discussed  Home and Transportation Safety: All rugs have non-skid backing?: N/A, no rugs All stairs or steps have railings?: yes (ramp) Grab bars in the bathtub or shower?: yes Have non-skid surface in bathtub or shower?: yes Good home lighting?: yes Regular seat belt use?: yes Hospital stays in the last year:: (!) yes How many hospital stays:: other (14 days in [place]) Reason: May 2025 Rehab place  Cognitive Assessment Difficulty concentrating, remembering, or making decisions? : yes Will 6CIT or Mini Cog be Completed: yes Give patient an address phrase to remember (5 components): [address] [place]  About what time is it?: 0 points Count backwards from 20 to 1: 0 points Say the months of the year in reverse: 0 points Repeat the address phrase from earlier: 0 points  Advance Directives (For Healthcare) Does Patient Have a Medical Advance Directive?: Yes Does patient want to make changes to medical advance directive?: No - Patient declined Type of Advance Directive: Healthcare Power of [place]; Living will Copy of Healthcare Power of Attorney in Chart?: No - copy requested Copy of Living Will in Chart?: No - copy requested  Reviewed/Updated  Reviewed/Updated: Reviewed All (Medical, Surgical, Family, Medications, Allergies, Care Teams, Patient Goals)    Allergies (verified) Brilinta  [ticagrelor ] and Nsaids   Current Medications  (  verified) Outpatient Encounter Medications as of 10/31/2024  Medication Sig   acetaminophen  (TYLENOL ) 325 MG tablet Take 1 tablet (325 mg total) by mouth every 6 (six) hours as needed for mild pain (pain score 1-3) (or Fever >/= 101).   allopurinol  (ZYLOPRIM ) 100 MG tablet Take 0.5 tablets (50 mg total) by mouth every Monday, Wednesday, and Friday.   amoxicillin -clavulanate (AUGMENTIN ) 875-125 MG tablet Take 1 tablet by mouth 2 (two) times daily.   Cholecalciferol  (VITAMIN D ) 1000 UNITS capsule Take 1,000 Units by mouth at bedtime.   ELIQUIS  2.5 MG TABS tablet TAKE 1 TABLET TWICE A DAY   fish oil-omega-3 fatty acids  1000 MG capsule Take 1 g by mouth at bedtime.   glucose blood (FREESTYLE LITE) test strip USE TO TEST BLOOD SUGAR ONCE DAILY AND AS NEEDED   hydrALAZINE  (APRESOLINE ) 25 MG tablet Take 1 tablet (25 mg total) by mouth 2 (two) times daily.   insulin  glargine (LANTUS  SOLOSTAR) 100 UNIT/ML Solostar Pen Inject 11-12 Units into the skin daily. Increase by 1 unit per day as long as AM sugar is above 150.  If AM sugar 100-150, no change in daily dose.  If AM sugar below 100, then would decrease lantus  dose by 1 unit.   Insulin  Pen Needle 29G X MISC Use as needed with insulin  injection   ipratropium (ATROVENT ) 0.03 % nasal spray USE 2 SPRAYS IN EACH NOSTRIL TWICE A DAY AS NEEDED FOR RHINITIS   isosorbide  mononitrate (IMDUR ) 60 MG 24 hr tablet TAKE ONE AND ONE-HALF TABLETS DAILY   JARDIANCE  10 MG TABS tablet Take 10 mg by mouth daily.   metoprolol  succinate (TOPROL -XL) 50 MG 24 hr tablet Take 1 tablet (50 mg total) by mouth every Monday, Wednesday, and Friday.   nitroGLYCERIN  (NITROSTAT ) 0.4 MG SL tablet DISSOLVE 1 TABLET UNDER THE TONGUE EVERY 5 MINUTES AS NEEDED FOR CHEST PAIN FOR A MAXIMUM OF 3 DOSES   polyethylene glycol (MIRALAX  / GLYCOLAX ) 17 g packet Take 17 g by mouth daily.   Potassium Gluconate 595 MG CAPS Take 595 mg by mouth in the morning.   ranolazine  (RANEXA ) 500 MG 12 hr  tablet Take 1 tablet (500 mg total) by mouth 2 (two) times daily.   rosuvastatin  (CRESTOR ) 10 MG tablet TAKE 1 TABLET DAILY   torsemide  (DEMADEX ) 20 MG tablet Take 1 tablet (20 mg total) by mouth 2 (two) times daily.   No facility-administered encounter medications on file as of 10/31/2024.    History: Past Medical History:  Diagnosis Date   Age-related macular degeneration, wet, both eyes ([name])    Anemia    Secondary to acute blood loss   Anginal pain    last chest pain in Feb 2021   Arthritis    mild in hands, knees, ankles (11/13/2015)   Atrial fibrillation ([name])    Consideration was given for atrial flutter ablation, but patient developed atrial fibrillation. Cardioversion was done. Dr. Fernande decided to watch him clinically. November, 2011   Atrial flutter [facility]) 07/2010   September, 2011   Hospital with PNA and cath done...Coumadin .  Atrial flutter ablation planned, but  pt. then had atrial fibrillation,/outpatient conversion 09/08/10..NSR..plan to follow..Dr. Fernande   CAD (coronary artery disease)    Catheterization, September, 2011,  grafts patent from redo CABG,, medical therapy of coronary disease, consideration to proceeding with atrial flutter ablation   Carotid artery disease    Doppler 09/18/2009 - 49% bilateral stenoses   Carotid artery disease    49% bilateral,  Doppler, November, 2010   CHF (congestive heart failure) ([name])    Chronic kidney disease (CKD), stage III (moderate) ([name])    Diabetic peripheral neuropathy ([name])    feet   Diverticulosis of colon with hemorrhage 2009   several unit diverticular bleed    Erectile dysfunction    Mild   Gout    Hearing loss    wears hearing aids   History of blood transfusion several   related to diverticular bleeding   Hyperlipidemia    Hypertension    Mitral regurgitation    Mild, echo, September, 2011   NSTEMI (non-ST elevated myocardial infarction) ([name]) 07/2010   at [facility] with repeat cath, rec medical mgmt    OSA  on CPAP    uses CPAP nightly   Personal history of colonic polyps    PNA (pneumonia) 07/2010   NSTEMI at [facility] with repeat cath, rec medical mgmt    RBBB (right bundle branch block)    S/P mitral valve repair 02/06/2020   s/p TEER with a single MitraClip NTW placed on A2/P2   Shoulder pain    positional; better now (11/13/2015)   Skin cancer    R lower leg, per derm 2012   Type II diabetes mellitus ([name])    Past Surgical History:  Procedure Laterality Date   CARDIAC CATHETERIZATION  2006   Nuclear..slight lateral ischemia..medical therapy   CARDIAC CATHETERIZATION  08/04/2010   grafts patent from redo CABG...medical Rx and ablate Atrial flutter (LV not injected)    CARDIOVERSION  ~ 2010   CAROTID ENDARTERECTOMY Right 1994   CATARACT EXTRACTION W/ INTRAOCULAR LENS  IMPLANT, BILATERAL Bilateral    CHOLECYSTECTOMY     COLON RESECTION N/A 01/13/2016   Procedure: EXPLORATORY LAPAROTOMY, LEFT AND SIGMOID COLON REMOVAL;  Surgeon: Lynda Leos, MD;  Location: [facility] OR;  Service: General;  Laterality: N/A;  Extended open left hemicolectomy and sigmoidectomy    COLONOSCOPY N/A 11/14/2015   Procedure: COLONOSCOPY;  Surgeon: Elspeth Deward Naval, MD;  Location: [facility] ENDOSCOPY;  Service: Gastroenterology;  Laterality: N/A;   COLONOSCOPY Left 01/05/2016   Procedure: COLONOSCOPY;  Surgeon: Elspeth Deward Naval, MD;  Location: [facility] ENDOSCOPY;  Service: Gastroenterology;  Laterality: Left;  no sedation to start, moderate if needed   COLONOSCOPY W/ POLYPECTOMY     CORONARY ARTERY BYPASS GRAFT  1995; 2006   X 3; X3   CORONARY ARTERY BYPASS GRAFT  1995, 2006   CORONARY STENT INTERVENTION N/A 02/02/2024   Procedure: CORONARY STENT INTERVENTION;  Surgeon: Wonda Sharper, MD;  Location: [facility] INVASIVE CV LAB;  Service: Cardiovascular;  Laterality: N/A;   DOPPLER ECHOCARDIOGRAPHY  08/2008   EF 60%   DOPPLER ECHOCARDIOGRAPHY  08/02/2010   65-70%   DOPPLER ECHOCARDIOGRAPHY  07/2010   MR mild    ESOPHAGOGASTRODUODENOSCOPY N/A 06/19/2014   Procedure: ESOPHAGOGASTRODUODENOSCOPY (EGD);  Surgeon: Gordy CHRISTELLA Starch, MD;  Location: [facility] ENDOSCOPY;  Service: Endoscopy;  Laterality: N/A;   ESOPHAGOGASTRODUODENOSCOPY N/A 11/14/2015   Procedure: ESOPHAGOGASTRODUODENOSCOPY (EGD);  Surgeon: Elspeth Deward Naval, MD;  Location: [facility] ENDOSCOPY;  Service: Gastroenterology;  Laterality: N/A;   ESOPHAGOGASTRODUODENOSCOPY (EGD) WITH PROPOFOL  N/A 08/18/2023   Procedure: ESOPHAGOGASTRODUODENOSCOPY (EGD) WITH PROPOFOL ;  Surgeon: San Sandor GAILS, DO;  Location: [facility] ENDOSCOPY;  Service: Gastroenterology;  Laterality: N/A;   FLEXIBLE SIGMOIDOSCOPY N/A 11/16/2015   Procedure: FLEXIBLE SIGMOIDOSCOPY;  Surgeon: Elspeth Deward Naval, MD;  Location: [facility] ENDOSCOPY;  Service: Gastroenterology;  Laterality: N/A;   LAPAROSCOPIC CHOLECYSTECTOMY  2008   LEFT HEART CATH N/A 06/14/2014  Procedure: LEFT HEART CATH;  Surgeon: Victory LELON Claudene DOUGLAS, MD;  Location: [facility] CATH LAB;  Service: Cardiovascular;  Laterality: N/A;   LEFT HEART CATH AND CORONARY ANGIOGRAPHY N/A 08/02/2018   Procedure: LEFT HEART CATH AND CORONARY ANGIOGRAPHY;  Surgeon: Court Dorn PARAS, MD;  [facility]: [facility] INVASIVE CV LAB;  Service: Cardiovascular;  Laterality: N/A;   LEFT HEART CATHETERIZATION WITH CORONARY/GRAFT ANGIOGRAM N/A 06/11/2014   Procedure: LEFT HEART CATHETERIZATION WITH EL BILE;  Surgeon: Victory LELON Claudene DOUGLAS, MD;  Location: [facility] CATH LAB;  Service: Cardiovascular;  Laterality: N/A;   PERCUTANEOUS CORONARY STENT INTERVENTION (PCI-S) N/A 06/13/2014   Procedure: PERCUTANEOUS CORONARY STENT INTERVENTION (PCI-S);  Surgeon: Victory LELON Claudene DOUGLAS, MD;  Location: [facility] CATH LAB;  Service: Cardiovascular;  Laterality: N/A;   RIGHT HEART CATH N/A 01/06/2020   Procedure: RIGHT HEART CATH;  Surgeon: Cherrie Toribio SAUNDERS, MD;  Location: [facility] INVASIVE CV LAB;  Service: Cardiovascular;  Laterality: N/A;   RIGHT/LEFT HEART CATH AND CORONARY/GRAFT ANGIOGRAPHY N/A  02/02/2024   Procedure: RIGHT/LEFT HEART CATH AND CORONARY/GRAFT ANGIOGRAPHY;  Surgeon: Wonda Sharper, MD;  Location: [facility] INVASIVE CV LAB;  Service: Cardiovascular;  Laterality: N/A;   SKIN CANCER EXCISION Left 10/2015   calf   SKIN CANCER EXCISION Right 2014?   chest   TEE WITHOUT CARDIOVERSION N/A 01/06/2020   Procedure: TRANSESOPHAGEAL ECHOCARDIOGRAM (TEE);  Surgeon: Cherrie Toribio SAUNDERS, MD;  Location: [facility] ENDOSCOPY;  Service: Cardiovascular;  Laterality: N/A;   TONSILLECTOMY     TRANSCATHETER MITRAL EDGE TO EDGE REPAIR N/A 02/06/2020   Procedure: MITRAL VALVE REPAIR;  Surgeon: Wonda Sharper, MD;  Location: [facility] INVASIVE CV LAB;  Service: Cardiovascular;  Laterality: N/A;   VASECTOMY     Family History  Problem Relation Age of Onset   Kidney disease Mother        Kidney failure   Stroke Mother    Diabetes Mother    Heart disease Father        MI   Arthritis Sister    Cancer Sister        Throat   Heart disease Sister        MI   Diabetes Sister    Prostate cancer Neg Hx    Colon cancer Neg Hx    Social History   Occupational History   Occupation: Retired Surveyor, [employer]. [employer]: RETIRED  Tobacco Use   Smoking status: Former    Current packs/day: 0.00    Average packs/day: 1 pack/day for 8.0 years (8.0 ttl pk-yrs)    Types: Cigarettes    Start date: 22    Quit date: 1958    Years since quitting: 68.0   Smokeless tobacco: Never   Tobacco comments:    quit smoking cigarettes in 1958  Vaping Use   Vaping status: Never Used  Substance and Sexual Activity   Alcohol use: No    Alcohol/week: 0.0 standard drinks of alcohol   Drug use: No   Sexual activity: Not Currently   Tobacco Counseling Counseling given: Not Answered Tobacco comments: quit smoking cigarettes in 1958  SDOH Screenings   Food Insecurity: No Food Insecurity (10/31/2024)  Housing: Low Risk (10/31/2024)  Transportation Needs: No Transportation Needs (10/31/2024)  Utilities:  Not At Risk (10/31/2024)  Alcohol Screen: Low Risk (10/31/2023)  Depression (PHQ2-9): Low Risk (10/31/2024)  Financial Resource Strain: Low Risk (10/31/2023)  Physical Activity: Inactive (10/31/2024)  Social Connections: Moderately Isolated (10/31/2024)  Stress: No Stress Concern Present (10/31/2024)  Tobacco Use:  Medium Risk (10/31/2024)  Health Literacy: Adequate Health Literacy (10/31/2023)   See flowsheets for full screening details  Depression Screen PHQ 2 & 9 Depression Scale- Over the past 2 weeks, how often have you been bothered by any of the following problems? Little interest or pleasure in doing things: 0 Feeling down, depressed, or hopeless (PHQ Adolescent also includes...irritable): 0 PHQ-2 Total Score: 0 Trouble falling or staying asleep, or sleeping too much: 0 Feeling tired or having little energy: 0 Poor appetite or overeating (PHQ Adolescent also includes...weight loss): 0 Feeling bad about yourself - or that you are a failure or have let yourself or your family down: 0 Trouble concentrating on things, such as reading the newspaper or watching television (PHQ Adolescent also includes...like school work): 0 Moving or speaking so slowly that other people could have noticed. Or the opposite - being so fidgety or restless that you have been moving around a lot more than usual: 0 Thoughts that you would be better off dead, or of hurting yourself in some way: 0 PHQ-9 Total Score: 0 If you checked off any problems, how difficult have these problems made it for you to do your work, take care of things at home, or get along with other people?: Not difficult at all  Depression Treatment Depression Interventions/Treatment : Counseling     Goals Addressed   None          Objective:    Today's Vitals   10/31/24 1437  BP: 118/76  Weight: 142 lb 6.4 oz (64.6 kg)  Height: 5' 7 (1.702 m)   Body mass index is 22.3 kg/m.  Hearing/Vision screen Vision Screening -  Comments:: Not UTD w/eye appts to Dr Cleotilde Immunizations and Health Maintenance Health Maintenance  Topic Date Due   Zoster Vaccines- Shingrix (1 of 2) 02/28/1951   OPHTHALMOLOGY EXAM  04/14/2019   COVID-19 Vaccine (7 - 2025-26 season) 07/15/2024   Medicare Annual Wellness (AWV)  10/30/2024   Influenza Vaccine  02/11/2025 (Originally 06/14/2024)   FOOT EXAM  11/19/2024   HEMOGLOBIN A1C  03/31/2025   DTaP/Tdap/Td (5 - Td or Tdap) 09/28/2034   Pneumococcal Vaccine: 50+ Years  Completed   Meningococcal B Vaccine  Aged Out        Assessment/Plan:  This is a routine wellness examination for [name].  Patient Care Team: Cleatus Arlyss RAMAN, MD as PCP - General Court Dorn PARAS, MD as PCP - Cardiology (Cardiology) Court Dorn PARAS, MD as Consulting Physician (Cardiology) Cleotilde Carlin Lenis, OD as Referring Physician (Optometry) Shona Rush, MD as Consulting Physician (Dermatology) Fate Morna SAILOR, [facility] (Inactive) as Pharmacist (Pharmacist) Moises Reusing, RN as [facility] Care Management Tobie Gordy POUR, MD as Consulting Physician (Nephrology) Caleen Dirks, MD as Consulting Physician (Internal Medicine)  I have personally reviewed and noted the following in the patients chart:   Medical and social history Use of alcohol, tobacco or illicit drugs  Current medications and supplements including opioid prescriptions. Functional ability and status Nutritional status Physical activity Advanced directives List of other physicians Hospitalizations, surgeries, and ER visits in previous 12 months Vitals Screenings to include cognitive, depression, and falls Referrals and appointments  No orders of the defined types were placed in this encounter.  In addition, I have reviewed and discussed with patient certain preventive protocols, quality metrics, and best practice recommendations. A written personalized care plan for preventive services as well as general preventive health recommendations  were provided to patient.   Jeremiah LITTIE Saris, LPN  10/31/2024    After Visit Summary: (In Person-Declined) Patient declined AVS at this time.  Nurse Notes: No voiced or noted concerns at this time Appointment(s) made: ([id number] CPE) HM Addressed: Not UTD w/eye exam;will make as needed soon;desires no Covid vaccine at this time

## 2024-10-31 NOTE — Patient Instructions (Signed)
 [name],  Thank you for taking the time for your Medicare Wellness Visit. I appreciate your continued commitment to your health goals. Please review the care plan we discussed, and feel free to reach out if I can assist you further.  Please note that Annual Wellness Visits do not include a physical exam. Some assessments may be limited, especially if the visit was conducted virtually. If needed, we may recommend an in-person follow-up with your provider.  Ongoing Care Seeing your primary care provider every 3 to 6 months helps us  monitor your health and provide consistent, personalized care.   Referrals If a referral was made during today's visit and you haven't received any updates within two weeks, please contact the referred provider directly to check on the status.  Recommended Screenings:  Health Maintenance  Topic Date Due   Zoster (Shingles) Vaccine (1 of 2) 02/28/1951   Eye exam for diabetics  04/14/2019   COVID-19 Vaccine (7 - 2025-26 season) 07/15/2024   Medicare Annual Wellness Visit  10/30/2024   Flu Shot  02/11/2025*   Complete foot exam   11/19/2024   Hemoglobin A1C  03/31/2025   DTaP/Tdap/Td vaccine (5 - Td or Tdap) 09/28/2034   Pneumococcal Vaccine for age over 88  Completed   Meningitis B Vaccine  Aged Out  *Topic was postponed. The date shown is not the original due date.       10/31/2024    2:41 PM  Advanced Directives  Does Patient Have a Medical Advance Directive? Yes  Type of Estate Agent of [place];Living will  Does patient want to make changes to medical advance directive? No - Patient declined  Copy of Healthcare Power of Attorney in Chart? No - copy requested    Vision: Annual vision screenings are recommended for early detection of glaucoma, cataracts, and diabetic retinopathy. These exams can also reveal signs of chronic conditions such as diabetes and high blood pressure.  Dental: Annual dental screenings help detect  early signs of oral cancer, gum disease, and other conditions linked to overall health, including heart disease and diabetes.

## 2024-11-10 ENCOUNTER — Other Ambulatory Visit (HOSPITAL_COMMUNITY): Payer: Self-pay | Admitting: Cardiovascular Disease

## 2024-11-11 ENCOUNTER — Other Ambulatory Visit: Payer: Self-pay | Admitting: Family Medicine

## 2024-11-11 ENCOUNTER — Telehealth: Payer: Self-pay

## 2024-11-11 MED ORDER — JARDIANCE 10 MG PO TABS: 10.0000 mg | ORAL_TABLET | Freq: Every day | ORAL | 0 refills | Status: AC

## 2024-11-11 NOTE — Telephone Encounter (Signed)
 Copied from CRM #[id number]. Topic: Clinical - Prescription Issue >> Nov 11, 2024 11:27 AM Lonell PEDLAR wrote: Reason for CRM: Patient's son is requesting a 7 day supply of JARDIANCE  to be sent to below pharmacy. [facility] and [facility] a week to fill. Patient is completely out.  [facility] [phone] - [place], [place] - [address] AT [facility] [address] [place] [address] Phone: [phone] Fax: [phone]    Called in as requested called patient and let know. Requested to call if any question

## 2024-11-15 ENCOUNTER — Telehealth: Payer: Self-pay

## 2024-11-15 DIAGNOSIS — I5022 Chronic systolic (congestive) heart failure: Secondary | ICD-10-CM

## 2024-11-15 DIAGNOSIS — I6523 Occlusion and stenosis of bilateral carotid arteries: Secondary | ICD-10-CM

## 2024-11-15 NOTE — Telephone Encounter (Signed)
 New order placed for carotid ultrasound.

## 2024-11-15 NOTE — Telephone Encounter (Signed)
 Copied from CRM [id number]. Topic: Clinical - Home Health Verbal Orders >> Nov 15, 2024 11:20 AM Shasta CROME wrote: Caller/Agency: Toya Gurney [facility] Callback Number: [phone] Secure VM Service Requested: Skilled Nursing Frequency: 1 x a week for 9 weeks Any new concerns about the patient? Yes  Weight Concern-  10/29/2024 141.4 11/04/2024-144.4 11/13/2024 147.4 11/15/2024 -146  Pt had concern of SOB-Pt took it upon his self to double torsemide  (DEMADEX ) 20 MG tablet(Taking 40)  Potassium G 90 gram table   Nurse would like to know if provider would like blood work or for her to call cardiologist.

## 2024-11-17 NOTE — Telephone Encounter (Signed)
 Please give the order.  Thanks.   How long has he been taking the higher dose of torsemide ?  Is he taking 40mg  torsemide  BID or once daily?  Did the higher dose help?    Please let me know about all 3 questions.   Would need f/u labs and OV soon, either here or cardiology.  Could do labs at the visit.  Thanks.

## 2024-11-17 NOTE — Addendum Note (Signed)
 Addended by: CLEATUS LORELI RAMAN on: 11/17/2024 08:04 PM   Modules accepted: Orders

## 2024-11-19 ENCOUNTER — Other Ambulatory Visit: Payer: Self-pay | Admitting: Family Medicine

## 2024-11-19 NOTE — Telephone Encounter (Signed)
 Noted. Thanks.   Please send order for CBC and BMET and please let me know when that will be collected so we can be watching for the incoming results.  Dx anemia D64.9.

## 2024-11-19 NOTE — Telephone Encounter (Signed)
 Spoke with Aimee, HH She states the patient was taking torsemide  BID x 1 wk and the higher dose did help. Patients son stated that the patient fell asleep up against the bed rail of his hospital bed and now the patient's arm is purple. Pt Denies falling. The [facility] nurse said she can draw labs on the patient if we can give her verbal orders to do so.

## 2024-11-20 NOTE — Telephone Encounter (Signed)
 Copied from CRM #[id number]. Topic: Clinical - Prescription Issue >> Nov 20, 2024 11:44 AM [name] wrote: Reason for CRM: Patient son called in regarding prescription JARDIANCE  10 MG TABS tablet , stated still waiting on prior authorization and patient is now currently out of medication

## 2024-11-21 ENCOUNTER — Encounter (HOSPITAL_BASED_OUTPATIENT_CLINIC_OR_DEPARTMENT_OTHER): Attending: Internal Medicine | Admitting: Internal Medicine

## 2024-11-21 ENCOUNTER — Other Ambulatory Visit: Payer: Self-pay | Admitting: Family Medicine

## 2024-11-21 ENCOUNTER — Telehealth: Payer: Self-pay

## 2024-11-21 DIAGNOSIS — E1122 Type 2 diabetes mellitus with diabetic chronic kidney disease: Secondary | ICD-10-CM | POA: Insufficient documentation

## 2024-11-21 DIAGNOSIS — I5032 Chronic diastolic (congestive) heart failure: Secondary | ICD-10-CM | POA: Insufficient documentation

## 2024-11-21 DIAGNOSIS — I4891 Unspecified atrial fibrillation: Secondary | ICD-10-CM | POA: Insufficient documentation

## 2024-11-21 DIAGNOSIS — Z8631 Personal history of diabetic foot ulcer: Secondary | ICD-10-CM | POA: Insufficient documentation

## 2024-11-21 DIAGNOSIS — I130 Hypertensive heart and chronic kidney disease with heart failure and stage 1 through stage 4 chronic kidney disease, or unspecified chronic kidney disease: Secondary | ICD-10-CM | POA: Diagnosis not present

## 2024-11-21 DIAGNOSIS — Z7901 Long term (current) use of anticoagulants: Secondary | ICD-10-CM | POA: Insufficient documentation

## 2024-11-21 DIAGNOSIS — Z872 Personal history of diseases of the skin and subcutaneous tissue: Secondary | ICD-10-CM | POA: Insufficient documentation

## 2024-11-21 DIAGNOSIS — I6529 Occlusion and stenosis of unspecified carotid artery: Secondary | ICD-10-CM | POA: Diagnosis not present

## 2024-11-21 DIAGNOSIS — Z09 Encounter for follow-up examination after completed treatment for conditions other than malignant neoplasm: Secondary | ICD-10-CM | POA: Diagnosis present

## 2024-11-21 DIAGNOSIS — N189 Chronic kidney disease, unspecified: Secondary | ICD-10-CM | POA: Diagnosis not present

## 2024-11-21 DIAGNOSIS — E1151 Type 2 diabetes mellitus with diabetic peripheral angiopathy without gangrene: Secondary | ICD-10-CM | POA: Insufficient documentation

## 2024-11-21 DIAGNOSIS — Z794 Long term (current) use of insulin: Secondary | ICD-10-CM | POA: Diagnosis not present

## 2024-11-21 DIAGNOSIS — I2510 Atherosclerotic heart disease of native coronary artery without angina pectoris: Secondary | ICD-10-CM | POA: Insufficient documentation

## 2024-11-21 DIAGNOSIS — Z85828 Personal history of other malignant neoplasm of skin: Secondary | ICD-10-CM | POA: Insufficient documentation

## 2024-11-21 NOTE — Telephone Encounter (Signed)
 Spoke with Aimee HH nurse. She states the patient now has bruising on both of his thighs just above the knee caps along with the bruising on his arms.She wants to know if you want any other labs done. I did advised  that the patient needs to be seen. She stated that the patient has an appointment with wound care today.

## 2024-11-21 NOTE — Telephone Encounter (Signed)
 error

## 2024-11-21 NOTE — Telephone Encounter (Signed)
 Spoke with [facility] RN, she will be able to do the BMET and CBC

## 2024-11-21 NOTE — Telephone Encounter (Signed)
 Left detailed message for Aimee [facility] nurse for lab work on patient.  Ok to capital one below

## 2024-11-21 NOTE — Telephone Encounter (Signed)
 If he can be seen at wound care today, then I would appreciate that.    He needs BMET and CBC done.  If this can't be done at home, then please check with lab about getting done at drive up lab appointment.  Thanks.

## 2024-11-22 NOTE — Telephone Encounter (Signed)
 Rx was filled on 11/19/24

## 2024-11-22 NOTE — Telephone Encounter (Signed)
 Please request those results and see about getting him scheduled here when possible.  Thanks.

## 2024-11-23 ENCOUNTER — Other Ambulatory Visit: Payer: Self-pay | Admitting: Family Medicine

## 2024-11-23 DIAGNOSIS — E1149 Type 2 diabetes mellitus with other diabetic neurological complication: Secondary | ICD-10-CM

## 2024-11-25 ENCOUNTER — Other Ambulatory Visit: Payer: Self-pay | Admitting: Family Medicine

## 2024-11-25 ENCOUNTER — Ambulatory Visit: Payer: Self-pay

## 2024-11-25 DIAGNOSIS — I272 Pulmonary hypertension, unspecified: Secondary | ICD-10-CM | POA: Diagnosis not present

## 2024-11-25 DIAGNOSIS — D631 Anemia in chronic kidney disease: Secondary | ICD-10-CM | POA: Diagnosis not present

## 2024-11-25 DIAGNOSIS — Z794 Long term (current) use of insulin: Secondary | ICD-10-CM | POA: Diagnosis not present

## 2024-11-25 DIAGNOSIS — E1149 Type 2 diabetes mellitus with other diabetic neurological complication: Secondary | ICD-10-CM

## 2024-11-25 DIAGNOSIS — I4821 Permanent atrial fibrillation: Secondary | ICD-10-CM | POA: Diagnosis not present

## 2024-11-25 DIAGNOSIS — E1142 Type 2 diabetes mellitus with diabetic polyneuropathy: Secondary | ICD-10-CM | POA: Diagnosis not present

## 2024-11-25 DIAGNOSIS — I5033 Acute on chronic diastolic (congestive) heart failure: Secondary | ICD-10-CM | POA: Diagnosis not present

## 2024-11-25 DIAGNOSIS — I130 Hypertensive heart and chronic kidney disease with heart failure and stage 1 through stage 4 chronic kidney disease, or unspecified chronic kidney disease: Secondary | ICD-10-CM | POA: Diagnosis not present

## 2024-11-25 DIAGNOSIS — I5032 Chronic diastolic (congestive) heart failure: Secondary | ICD-10-CM | POA: Diagnosis not present

## 2024-11-25 DIAGNOSIS — I4892 Unspecified atrial flutter: Secondary | ICD-10-CM | POA: Diagnosis not present

## 2024-11-25 DIAGNOSIS — E1122 Type 2 diabetes mellitus with diabetic chronic kidney disease: Secondary | ICD-10-CM | POA: Diagnosis not present

## 2024-11-25 DIAGNOSIS — E1151 Type 2 diabetes mellitus with diabetic peripheral angiopathy without gangrene: Secondary | ICD-10-CM | POA: Diagnosis not present

## 2024-11-25 DIAGNOSIS — N184 Chronic kidney disease, stage 4 (severe): Secondary | ICD-10-CM | POA: Diagnosis not present

## 2024-11-25 NOTE — Telephone Encounter (Signed)
 Disregard 11/23/24 refill note.  Jardiance  Last filled:  11/19/24, #7 Last OV:  10/01/24, f/u Next OV:  12/23/24, annual exam

## 2024-11-25 NOTE — Telephone Encounter (Signed)
 Jardiance  Last filled:  11/19/24, #7 Last OV:  10/01/24, f/u Next OV:  12/23/24, annual exam

## 2024-11-25 NOTE — Telephone Encounter (Signed)
 Amy- please call pt.  If any bleeding in the meantime, then I would temporarily hold eliquis , at least until the office visit tomorrow.   -----------------------------  Dugal- Thank you for seeing this patient.  If his bleeding risk is that high, the best option may be to hold eliquis  in the long term.

## 2024-11-25 NOTE — Telephone Encounter (Unsigned)
 Copied from CRM [id number]. Topic: Clinical - Medication Refill >> Nov 25, 2024  7:53 AM Donna BRAVO wrote: Medication: JARDIANCE  10 MG TABS tablet   Has the patient contacted their pharmacy? Yes New prescription needs to be sent in  This is the patient's preferred pharmacy:   [facility] DELIVERY - [place], [place] - [address] [place] [address] Phone: [phone] Fax: [phone]   Is this the correct pharmacy for this prescription? Yes If no, delete pharmacy and type the correct one.   Has the prescription been filled recently? Yes  Is the patient out of the medication? Yes  Has the patient been seen for an appointment in the last year OR does the patient have an upcoming appointment? Yes  Can we respond through MyChart? No  Agent: Please be advised that Rx refills may take up to 3 business days. We ask that you follow-up with your pharmacy.

## 2024-11-25 NOTE — Telephone Encounter (Signed)
 Noted. Thanks.

## 2024-11-25 NOTE — Telephone Encounter (Signed)
 Pt has appointment tomorrow (11/26/24)

## 2024-11-25 NOTE — Telephone Encounter (Signed)
 Spoke with pt. Pt states he has no bleeding at the moment but he feels like the bruises are coming from the Eliquis . Pt stated he took his Eliquis  this morning but is not going to take any more until his appointment tomorrow.

## 2024-11-25 NOTE — Telephone Encounter (Signed)
 FYI Only or Action Required?: Action required by provider: Son wants to know if pt should continue blood thinner. Pt has many bruises and bleeds easily. Appt for tomorrow. Please call son back regarding d/Holiday  of Eliquis .  Patient was last seen in primary care on 10/01/2024 by Cleatus Arlyss RAMAN, MD.  Called Nurse Triage reporting Abrasion - skin tears and bruising  Symptoms began several months ago.  Interventions attempted: Other: seen at wound care.  Symptoms are: gradually worsening.  Triage Disposition: No disposition on file.  Patient/caregiver understands and will follow disposition?: yes - called CAL to have pt worked in. S/w Emmie. Unable to work in today.                          Copied from CRM [id number]. Topic: Clinical - Red Word Triage >> Nov 25, 2024  7:56 AM Donna BRAVO wrote: Red Word that prompted transfer to Nurse Triage:  bark bruises on arms and legs if touch his skin it rips and bleeds wakes ups with blood on sheets and pajamas Reason for Disposition  [1] After 14 days AND [2] wound isn't healed  Answer Assessment - Initial Assessment Questions Call from pt's son. He states that pt's skin is very delicate and rips open with small amount of pressure and bleeds. Pt also has multiple bruises.No appts available - called CAL. CAL has nothing available today.        1. APPEARANCE What does the injury look like?      Pt has multiple bruises and skin tears 2. ONSET: How long ago did the injury occur?      ongoing 3. LOCATION: Where is the injury located?      Various areas  4. SIZE: How large is the cut?      na 5. BLEEDING: Is it bleeding now? If Yes, ask: Is it difficult to stop?      yes 6. PAIN: Is there any pain? If Yes, ask: How bad is the pain? (Scale 0-10; or none, mild, moderate, severe)      7. MECHANISM: Tell me how it happened.      Skin is very delicate  Protocols used: Skin Injury-A-AH

## 2024-11-26 ENCOUNTER — Telehealth: Payer: Self-pay | Admitting: Family

## 2024-11-26 ENCOUNTER — Ambulatory Visit: Admitting: Family

## 2024-11-26 ENCOUNTER — Encounter: Payer: Self-pay | Admitting: Family

## 2024-11-26 VITALS — BP 126/82 | HR 80 | Temp 98.3°F | Ht 67.0 in | Wt 148.8 lb

## 2024-11-26 DIAGNOSIS — L609 Nail disorder, unspecified: Secondary | ICD-10-CM

## 2024-11-26 DIAGNOSIS — Z7984 Long term (current) use of oral hypoglycemic drugs: Secondary | ICD-10-CM | POA: Diagnosis not present

## 2024-11-26 DIAGNOSIS — Z7901 Long term (current) use of anticoagulants: Secondary | ICD-10-CM | POA: Diagnosis not present

## 2024-11-26 DIAGNOSIS — R233 Spontaneous ecchymoses: Secondary | ICD-10-CM

## 2024-11-26 DIAGNOSIS — E1149 Type 2 diabetes mellitus with other diabetic neurological complication: Secondary | ICD-10-CM | POA: Diagnosis not present

## 2024-11-26 LAB — CBC WITH DIFFERENTIAL/PLATELET
Basophils Absolute: 0 K/uL (ref 0.0–0.1)
Basophils Relative: 0.4 % (ref 0.0–3.0)
Eosinophils Absolute: 0.2 K/uL (ref 0.0–0.7)
Eosinophils Relative: 4.2 % (ref 0.0–5.0)
HCT: 30.3 % — ABNORMAL LOW (ref 39.0–52.0)
Hemoglobin: 10 g/dL — ABNORMAL LOW (ref 13.0–17.0)
Lymphocytes Relative: 17.4 % (ref 12.0–46.0)
Lymphs Abs: 0.9 K/uL (ref 0.7–4.0)
MCHC: 33 g/dL (ref 30.0–36.0)
MCV: 98.7 fl (ref 78.0–100.0)
Monocytes Absolute: 0.5 K/uL (ref 0.1–1.0)
Monocytes Relative: 9 % (ref 3.0–12.0)
Neutro Abs: 3.7 K/uL (ref 1.4–7.7)
Neutrophils Relative %: 69 % (ref 43.0–77.0)
Platelets: 142 K/uL — ABNORMAL LOW (ref 150.0–400.0)
RBC: 3.07 Mil/uL — ABNORMAL LOW (ref 4.22–5.81)
RDW: 16.2 % — ABNORMAL HIGH (ref 11.5–15.5)
WBC: 5.4 K/uL (ref 4.0–10.5)

## 2024-11-26 MED ORDER — JARDIANCE 10 MG PO TABS: 10.0000 mg | ORAL_TABLET | Freq: Every day | ORAL | 1 refills | Status: DC

## 2024-11-26 MED ORDER — HYDRALAZINE HCL 25 MG PO TABS: 25.0000 mg | ORAL_TABLET | Freq: Two times a day (BID) | ORAL | 1 refills | Status: AC

## 2024-11-26 NOTE — Telephone Encounter (Signed)
 Saw patient already, thank you for speaking with the patient.  Please see note for further information.

## 2024-11-26 NOTE — Telephone Encounter (Addendum)
 Pt is requesting a refill on his hydralazine  as well as jardiance  (he is having trouble getting refilled and he is completely out of jardiance .

## 2024-11-26 NOTE — Telephone Encounter (Signed)
I sent both.  Thanks.

## 2024-11-26 NOTE — Progress Notes (Signed)
 "  Established Patient Office Visit  Subjective:      CC:  Chief Complaint  Patient presents with   Acute Visit    Bruising on arms and legs. No recent falls.    HPI: [name] is a 89 y.o. male presenting on 11/26/2024 for Acute Visit (Bruising on arms and legs. No recent falls.) .  Discussed the use of AI scribe software for clinical note transcription with the patient, who gave verbal consent to proceed.  History of Present Illness [name] is a 89 year old male with diverticular disease and diabetes who presents with worsening bruising. He is accompanied by his son, Garrel.  He has been experiencing worsening bruising over the past two weeks. The bruising has not spread to his back or traveled down. No bleeding in his stool or epistaxis has been noted. No recent major bleeding events, epistaxis, or changes in stool color.  He has a history of diverticular disease, which led to the removal of his colon. Since the surgery, he has not experienced gastrointestinal bleeding. Prior to the surgery, he had lower GI bleeding attributed to his history with [facility] [place].  He is experiencing issues with his toenails, which are growing excessively and require professional care. He has been receiving manicures and pedicures for the past ten years but now requires a podiatrist's attention.  He is on several medications, including Eliquis , Jardiance , and Ranexa . He has run out of Jardiance  and has been taking half doses of Ranexa  due to limited supply. He has been trying to manage his prescriptions through [facility] but has encountered delays.  He has a history of high blood pressure, which is being treated. No history of kidney disease, liver disease, or stroke. He does not consume alcohol.         Social history:  Relevant past medical, surgical, family and social history reviewed and updated as indicated. Interim medical history since our last visit  reviewed.  Allergies and medications reviewed and updated.  DATA REVIEWED: CHART IN EPIC     ROS: Negative unless specifically indicated above in HPI.   Current Medications[1]        Objective:        BP 126/82 (BP Location: Left Arm, Patient Position: Sitting, Cuff Size: Normal)   Pulse 80   Temp 98.3 F (36.8 C) (Temporal)   Ht 5' 7 (1.702 m)   Wt 148 lb 12.8 oz (67.5 kg)   SpO2 96%   BMI 23.31 kg/m   Physical Exam SKIN: Bruising on skin, worsening over two weeks.  Wt Readings from Last 3 Encounters:  11/26/24 148 lb 12.8 oz (67.5 kg)  10/31/24 142 lb 6.4 oz (64.6 kg)  10/01/24 144 lb 3.2 oz (65.4 kg)    Physical Exam Constitutional:      General: He is not in acute distress.    Appearance: Normal appearance. He is normal weight. He is not ill-appearing, toxic-appearing or diaphoretic.  Cardiovascular:     Rate and Rhythm: Normal rate.  Pulmonary:     Effort: Pulmonary effort is normal.  Musculoskeletal:        General: Normal range of motion.  Skin:    Findings: Ecchymosis (bil upper extremities and bil lower extremities) and lesion (right upper scalp with raised red lesion) present.  Neurological:     General: No focal deficit present.     Mental Status: He is alert and oriented to person, place, and time. Mental status is at  baseline.  Psychiatric:        Mood and Affect: Mood normal.        Behavior: Behavior normal.        Thought Content: Thought content normal.        Judgment: Judgment normal.               Results    Assessment & Plan:   Assessment and Plan Assessment & Plan Spontaneous ecchymoses due to chronic anticoagulation Significant bruising over the last two weeks, worsening, likely related to chronic anticoagulation with Eliquis . High risk for major bleeding due to history of lower GI bleeding and current bruising. Differential includes idiopathic thrombocytopenia purpura (ITP). - Ordered blood work to rule out  other causes of bleeding. - Consulted with cardiology regarding alternatives to anticoagulation. - Held Eliquis  until further evaluation.  Type 2 diabetes mellitus with neurological complications Diabetes management complicated by medication supply issues. Ranexa  supply is low, and Jardiance  prescription has not been renewed for three weeks. - Sent message to Dr. Cleatus to address Jardiance  prescription renewal. - Advised to request short supply of Ranexa  from local pharmacy if needed.  Nail disorder Toenails are growing abnormally, resembling gripping a tree limb. No fungal infection noted. - Referred to podiatrist for nail care.        Return in about 4 days (around 11/30/2024) for with PCP .     Ginger Patrick, MSN, APRN, FNP-C [facility]        [1]  Current Outpatient Medications:    acetaminophen  (TYLENOL ) 325 MG tablet, Take 1 tablet (325 mg total) by mouth every 6 (six) hours as needed for mild pain (pain score 1-3) (or Fever >/= 101)., Disp: 20 tablet, Rfl: 0   allopurinol  (ZYLOPRIM ) 100 MG tablet, Take 0.5 tablets (50 mg total) by mouth every Monday, Wednesday, and Friday., Disp: 20 tablet, Rfl: 3   Cholecalciferol  (VITAMIN D ) 1000 UNITS capsule, Take 1,000 Units by mouth at bedtime., Disp: , Rfl:    ELIQUIS  2.5 MG TABS tablet, TAKE 1 TABLET TWICE A DAY, Disp: 180 tablet, Rfl: 3   fish oil-omega-3 fatty acids  1000 MG capsule, Take 1 g by mouth at bedtime., Disp: , Rfl:    glucose blood (FREESTYLE LITE) test strip, USE TO TEST BLOOD SUGAR ONCE DAILY AND AS NEEDED, Disp: 300 strip, Rfl: 3   insulin  glargine (LANTUS  SOLOSTAR) 100 UNIT/ML Solostar Pen, INJECT INTO SKIN 5 UNITS DAILY, INCREASE BY 1 UNIT PER DAY AS LONG AS AM SUGAR ABOVE 150, IF 100-150 NO CHANGE, IF BELOW 100 DECREASE BY 1 UNIT, Disp: 15 mL, Rfl: 1   Insulin  Pen Needle 29G X MISC, Use as needed with insulin  injection, Disp: 100 each, Rfl: 3   ipratropium (ATROVENT ) 0.03 %  nasal spray, USE 2 SPRAYS IN EACH NOSTRIL TWICE A DAY AS NEEDED FOR RHINITIS, Disp: 60 mL, Rfl: 3   isosorbide  mononitrate (IMDUR ) 60 MG 24 hr tablet, TAKE ONE AND ONE-HALF TABLETS DAILY, Disp: 135 tablet, Rfl: 2   metoprolol  succinate (TOPROL -XL) 50 MG 24 hr tablet, Take 1 tablet (50 mg total) by mouth every Monday, Wednesday, and Friday., Disp: 45 tablet, Rfl: 3   nitroGLYCERIN  (NITROSTAT ) 0.4 MG SL tablet, DISSOLVE 1 TABLET UNDER THE TONGUE EVERY 5 MINUTES AS NEEDED FOR CHEST PAIN FOR A MAXIMUM OF 3 DOSES, Disp: 25 tablet, Rfl: 11   polyethylene glycol (MIRALAX  / GLYCOLAX ) 17 g packet, Take 17 g by mouth daily., Disp: , Rfl:  Potassium Gluconate 595 MG CAPS, Take 595 mg by mouth in the morning., Disp: , Rfl:    ranolazine  (RANEXA ) 500 MG 12 hr tablet, Take 1 tablet (500 mg total) by mouth 2 (two) times daily., Disp: , Rfl:    rosuvastatin  (CRESTOR ) 10 MG tablet, TAKE 1 TABLET DAILY, Disp: 90 tablet, Rfl: 3   torsemide  (DEMADEX ) 20 MG tablet, Take 1 tablet (20 mg total) by mouth 2 (two) times daily., Disp: , Rfl:    hydrALAZINE  (APRESOLINE ) 25 MG tablet, Take 1 tablet (25 mg total) by mouth in the morning and at bedtime., Disp: 180 tablet, Rfl: 1   JARDIANCE  10 MG TABS tablet, Take 1 tablet (10 mg total) by mouth daily., Disp: 90 tablet, Rfl: 1  "

## 2024-11-27 NOTE — Telephone Encounter (Signed)
 noted

## 2024-11-28 ENCOUNTER — Ambulatory Visit: Payer: Self-pay | Admitting: Family

## 2024-11-28 DIAGNOSIS — D649 Anemia, unspecified: Secondary | ICD-10-CM

## 2024-11-29 ENCOUNTER — Other Ambulatory Visit: Payer: Self-pay | Admitting: Family Medicine

## 2024-11-29 ENCOUNTER — Other Ambulatory Visit

## 2024-11-29 ENCOUNTER — Telehealth: Payer: Self-pay | Admitting: Family Medicine

## 2024-11-29 DIAGNOSIS — D649 Anemia, unspecified: Secondary | ICD-10-CM

## 2024-11-29 LAB — CBC WITH DIFFERENTIAL/PLATELET
Absolute Lymphocytes: 1422 {cells}/uL (ref 850–3900)
Absolute Monocytes: 460 {cells}/uL (ref 200–950)
Basophils Absolute: 18 {cells}/uL (ref 0–200)
Basophils Relative: 0.3 %
Eosinophils Absolute: 224 {cells}/uL (ref 15–500)
Eosinophils Relative: 3.8 %
HCT: 31.3 % — ABNORMAL LOW (ref 39.4–51.1)
Hemoglobin: 10 g/dL — ABNORMAL LOW (ref 13.2–17.1)
MCH: 31.6 pg (ref 27.0–33.0)
MCHC: 31.9 g/dL (ref 31.6–35.4)
MCV: 99.1 fL (ref 81.4–101.7)
MPV: 10.8 fL (ref 7.5–12.5)
Monocytes Relative: 7.8 %
Neutro Abs: 3776 {cells}/uL (ref 1500–7800)
Neutrophils Relative %: 64 %
Platelets: 149 Thousand/uL (ref 140–400)
RBC: 3.16 Million/uL — ABNORMAL LOW (ref 4.20–5.80)
RDW: 13.2 % (ref 11.0–15.0)
Total Lymphocyte: 24.1 %
WBC: 5.9 Thousand/uL (ref 3.8–10.8)

## 2024-11-29 LAB — COMPREHENSIVE METABOLIC PANEL WITH GFR
AG Ratio: 1.1 (calc) (ref 1.0–2.5)
ALT: 23 U/L (ref 9–46)
AST: 26 U/L (ref 10–35)
Albumin: 3.9 g/dL (ref 3.6–5.1)
Alkaline phosphatase (APISO): 129 U/L (ref 35–144)
BUN/Creatinine Ratio: 29 (calc) — ABNORMAL HIGH (ref 6–22)
BUN: 70 mg/dL — ABNORMAL HIGH (ref 7–25)
CO2: 26 mmol/L (ref 20–32)
Calcium: 9.7 mg/dL (ref 8.6–10.3)
Chloride: 102 mmol/L (ref 98–110)
Creat: 2.42 mg/dL — ABNORMAL HIGH (ref 0.70–1.22)
Globulin: 3.5 g/dL (ref 1.9–3.7)
Glucose, Bld: 182 mg/dL — ABNORMAL HIGH (ref 65–99)
Potassium: 4.9 mmol/L (ref 3.5–5.3)
Sodium: 137 mmol/L (ref 135–146)
Total Bilirubin: 1 mg/dL (ref 0.2–1.2)
Total Protein: 7.4 g/dL (ref 6.1–8.1)
eGFR: 25 mL/min/1.73m2 — ABNORMAL LOW

## 2024-11-29 NOTE — Telephone Encounter (Signed)
 Spoke with Aimee HH nurse, she is concerned about Pt being dehydrated and would like labs done to check his Kidneys.

## 2024-11-29 NOTE — Telephone Encounter (Signed)
 Labs ordered, lab aware. Thanks.

## 2024-11-29 NOTE — Telephone Encounter (Signed)
 He didn't have renal function checked with most recent labs.  Had HGB checked.   Result note from Dugal-  Anemia is mild however given his presentation at the visit I'd suggest he come and complete a fobt stool just to make sure he is not bleeding.    I would suggest coming in for a lab only appt in about one week to repeat the levels to make sure anemia is remaining stable.  Would recheck BMET and CBC and IFOB next week, when possible.     ======================== Verify current torsemide  dose, ie dose he is taking now.  Would take an extra 20mg  per day until his was was decreasing, then resume lower dose after that.    Thanks.

## 2024-11-29 NOTE — Addendum Note (Signed)
 Addended by: CLEATUS LORELI RAMAN on: 11/29/2024 02:10 PM   Modules accepted: Orders

## 2024-11-29 NOTE — Telephone Encounter (Signed)
 Copied from CRM [id number]. Topic: Clinical - Lab/Test Results >> Nov 29, 2024  9:23 AM Willma SAUNDERS wrote: Reason for CRM: Aimee from [facility] wants to follow up with a nurse, patient came to the office on 01/13 and had labs done, would like to inquire about his kidney functions. Patient has been gaining weight, this week patients weight was 148.2 and last week he was at 142. Is concerned that he has increased his torsemide  (DEMADEX ) 20 MG tablet last week on his own, but was having difficulty urinating so he went back to the normal dose.   Aimme can be reached at [phone]

## 2024-12-01 ENCOUNTER — Ambulatory Visit: Payer: Self-pay | Admitting: Family Medicine

## 2024-12-02 NOTE — Telephone Encounter (Signed)
 Copied from CRM [id number]. Topic: Clinical - Lab/Test Results >> Dec 02, 2024 11:17 AM Deleta RAMAN wrote: Reason for CRM: [facility] calling to see if the patient lab results are in [phone]

## 2024-12-03 ENCOUNTER — Ambulatory Visit: Admitting: Podiatry

## 2024-12-05 ENCOUNTER — Encounter: Payer: Self-pay | Admitting: Family Medicine

## 2024-12-05 ENCOUNTER — Telehealth: Payer: Self-pay

## 2024-12-05 ENCOUNTER — Ambulatory Visit (INDEPENDENT_AMBULATORY_CARE_PROVIDER_SITE_OTHER): Admitting: Podiatry

## 2024-12-05 ENCOUNTER — Encounter: Payer: Self-pay | Admitting: Podiatry

## 2024-12-05 ENCOUNTER — Other Ambulatory Visit: Payer: Self-pay

## 2024-12-05 DIAGNOSIS — B351 Tinea unguium: Secondary | ICD-10-CM | POA: Diagnosis not present

## 2024-12-05 DIAGNOSIS — S90421A Blister (nonthermal), right great toe, initial encounter: Secondary | ICD-10-CM

## 2024-12-05 DIAGNOSIS — M79675 Pain in left toe(s): Secondary | ICD-10-CM | POA: Diagnosis not present

## 2024-12-05 DIAGNOSIS — M79674 Pain in right toe(s): Secondary | ICD-10-CM | POA: Diagnosis not present

## 2024-12-05 MED ORDER — JARDIANCE 10 MG PO TABS: 10.0000 mg | ORAL_TABLET | Freq: Every day | ORAL | 1 refills | Status: AC

## 2024-12-05 NOTE — Telephone Encounter (Signed)
 Addressed this with patient today on phone call.  No further action needed at this time.

## 2024-12-05 NOTE — Progress Notes (Signed)
 Patient presents for evaluation and treatment of tenderness and some redness around nails feet.  Tenderness around toes with walking and wearing shoes.  Physical exam:  General appearance: Alert, pleasant, and in no acute distress.  Vascular: Pedal pulses: DP 0/4 B/L, PT 0/4 B/L.  Moderate to severe edema lower legs bilaterally.  Capillary refill time immediate bilaterally  Neurologic:  Dermatologic:  Nails thickened, disfigured, discolored 1-5 BL with subungual debris.  Redness and hypertrophic nail folds along nail folds bilaterally but no signs of drainage or infection.  Musculoskeletal:  Hammertoes 2 through 5 bilaterally.  Hallux valgus deformity bilaterally.  Limited range of motion of the first MTP bilaterally normal muscle strength lower extremity bilaterally   Diagnosis: 1. Painful onychomycotic nails 1 through 5 bilaterally. 2. Pain toes 1 through 5 bilaterally. 3.  Noninfected blister great toe extending into the first MTP right  Plan: -New patient office visit for evaluation and management level 3.  Modifier 25 on the great toe.   -Discussed the blister.dried blood and mild serous fluid with the deroofing  the blister today.  Area was generally healed underneath some dried blood present no signs of infection.  Will have him clean it with warm soapy water and apply bacitracin ointment and a light dressing.  Should do this for a week or 2 till completely healed.  And if any signs of infection are seen call the office  -Debrided onychomycotic nails 1 through 5 bilaterally.  Sharply debrided nails with nail clipper and reduced with a power bur.  Return 3 months [facility]

## 2024-12-05 NOTE — Telephone Encounter (Signed)
 I have reached out to patient son and reviewed information. Call documented in phone encounter.  No further action needed at this time.

## 2024-12-05 NOTE — Telephone Encounter (Signed)
 Copied from CRM #[id number]. Topic: Clinical - Medication Refill >> Dec 05, 2024  9:23 AM Antwanette L wrote: Medication: JARDIANCE  10 MG TABS tablet  Has the patient contacted their pharmacy? Yes   This is the patient's preferred pharmacy:  [facility] DEL[facility] - [place], [place] - [address] [place] [address] Phone: [phone] Fax: [phone]    Is this the correct pharmacy for this prescription? Yes    Has the prescription been filled recently? Yes.Last refill was on 11/26/24. The patient only got a 7 day supply for [facility]  Is the patient out of the medication? Yes  Has the patient been seen for an appointment in the last year OR does the patient have an upcoming appointment? Yes. Last office visit with Dr. Cleatus was on 10/01/24 and next appt is 12/23/24  Can we respond through MyChart? No. Patient can be reached at [phone]  Agent: Please be advised that Rx refills may take up to 3 business days. We ask that you follow-up with your pharmacy. >> Dec 05, 2024  9:31 AM Antwanette L wrote: Patient has been without his Jardiance  10 mg tablets for one month. He was able to obtain a 7-day supply from [facility] last Monday. [facility] is currently awaiting provider approval to process the full prescription.

## 2024-12-05 NOTE — Telephone Encounter (Signed)
 Copied from CRM #[id number]. Topic: Clinical - Lab/Test Results >> Dec 05, 2024  9:33 AM Antwanette L wrote: Reason for CRM: Ozell, the patient's son, reports they have not received any information regarding the patient's lab results from 11/29/2024. He is requesting a callback at [phone] >> Dec 05, 2024  9:36 AM Antwanette L wrote: The pt has an appt today at 4pm. Please call before then

## 2024-12-09 ENCOUNTER — Telehealth: Payer: Self-pay

## 2024-12-09 NOTE — Telephone Encounter (Signed)
 Copied from CRM [id number]. Topic: Clinical - Medical Advice >> Dec 09, 2024 12:42 PM Terri MATSU wrote: Reason for CRM: [name] (patient son) is calling back . His dad weight is 147.2 and I scheduled that OV for them for 2/3

## 2024-12-09 NOTE — Telephone Encounter (Signed)
 Noted (see 12/01/24 Results F/u note). Fyi to Dr Cleatus.

## 2024-12-10 NOTE — Telephone Encounter (Signed)
 Pt has OV for bruising on 12/17/24.

## 2024-12-11 NOTE — Telephone Encounter (Signed)
 Noted. Thanks.  That is lower than prev and will d/w pt at OV.  If weight is increasing (>150) in the meantime, then update us .

## 2024-12-12 ENCOUNTER — Ambulatory Visit: Payer: Self-pay

## 2024-12-12 NOTE — Telephone Encounter (Signed)
 FYI Only or Action Required?: Action required by provider: update on patient condition and request for wound care orders.  Patient was last seen in primary care on 11/26/2024 by Corwin Antu, FNP.  Called Nurse Triage reporting Pressure Ulcer.  Symptoms began ongoing.  Interventions attempted: Other: foam and calcium  alginate.  Symptoms are: unchanged.  Triage Disposition: Home Care  Patient/caregiver understands and will follow disposition?: yes-        Message from [place] E sent at 12/12/2024  3:43 PM EST  Summary: 2 new wounds home health nurse   Reason for Triage: wound care center released patient 2 weeks ago 2 new wounds left buttock and le tf leg lower anterior need wound care orders Patient looks tired more so than normal         Reason for Disposition  1 or 2 small sores  Answer Assessment - Initial Assessment Questions Please call Aimee (see under contacts) with wound care orders.    1. APPEARANCE of SORES: What do the sores look like?     Buttock: pink and clean tissue applied foam and  to area size: 1 cm x 0.5 cm Leg wound partially scabbed over and covered with alginate and foam 2. NUMBER: How many sores are there?     2 3. SIZE: How big is the largest sore?     Left lower leg sore 1.5 cm x 1 cm 4. LOCATION: Where are the sores located?     Left buttock left lower leg  5. CAUSE: What do you think is causing the sores?     Pressure sore to buttock /scab may have caused a skin tear when putting compression hose. Plus swelling 6. OTHER SYMPTOMS: Do you have any other symptoms? (e.g., fever, new weakness)     Fatigue seems tired  Protocols used: Sores-A-AH

## 2024-12-13 NOTE — Telephone Encounter (Signed)
 Spoke with Aimme [facility] nurse and relayed message for verbal orders on wound care.She had no additional questions or concerns at this time.

## 2024-12-17 ENCOUNTER — Ambulatory Visit: Admitting: Family Medicine

## 2024-12-23 ENCOUNTER — Encounter: Admitting: Family Medicine

## 2024-12-31 ENCOUNTER — Ambulatory Visit (HOSPITAL_COMMUNITY)
